# Patient Record
Sex: Male | Born: 1960 | Race: White | Hispanic: No | Smoking: Never smoker
Health system: Southern US, Community
[De-identification: ages and names within clinical notes are randomized; demographics above are authoritative.]

## PROBLEM LIST (undated history)

## (undated) DIAGNOSIS — I82409 Acute embolism and thrombosis of unspecified deep veins of unspecified lower extremity: Secondary | ICD-10-CM

## (undated) DIAGNOSIS — F419 Anxiety disorder, unspecified: Secondary | ICD-10-CM

## (undated) DIAGNOSIS — E785 Hyperlipidemia, unspecified: Secondary | ICD-10-CM

## (undated) DIAGNOSIS — I10 Essential (primary) hypertension: Secondary | ICD-10-CM

## (undated) DIAGNOSIS — F32A Depression, unspecified: Secondary | ICD-10-CM

## (undated) DIAGNOSIS — IMO0002 Reserved for concepts with insufficient information to code with codable children: Secondary | ICD-10-CM

## (undated) DIAGNOSIS — K219 Gastro-esophageal reflux disease without esophagitis: Secondary | ICD-10-CM

## (undated) DIAGNOSIS — M810 Age-related osteoporosis without current pathological fracture: Secondary | ICD-10-CM

## (undated) DIAGNOSIS — G4730 Sleep apnea, unspecified: Secondary | ICD-10-CM

## (undated) DIAGNOSIS — R112 Nausea with vomiting, unspecified: Secondary | ICD-10-CM

## (undated) DIAGNOSIS — M329 Systemic lupus erythematosus, unspecified: Secondary | ICD-10-CM

## (undated) DIAGNOSIS — J449 Chronic obstructive pulmonary disease, unspecified: Secondary | ICD-10-CM

## (undated) DIAGNOSIS — I2699 Other pulmonary embolism without acute cor pulmonale: Secondary | ICD-10-CM

## (undated) DIAGNOSIS — Z87442 Personal history of urinary calculi: Secondary | ICD-10-CM

## (undated) DIAGNOSIS — N289 Disorder of kidney and ureter, unspecified: Secondary | ICD-10-CM

## (undated) DIAGNOSIS — J189 Pneumonia, unspecified organism: Secondary | ICD-10-CM

## (undated) DIAGNOSIS — J45909 Unspecified asthma, uncomplicated: Secondary | ICD-10-CM

## (undated) DIAGNOSIS — F329 Major depressive disorder, single episode, unspecified: Secondary | ICD-10-CM

## (undated) DIAGNOSIS — R0600 Dyspnea, unspecified: Secondary | ICD-10-CM

## (undated) DIAGNOSIS — Z9889 Other specified postprocedural states: Secondary | ICD-10-CM

## (undated) HISTORY — DX: Chronic obstructive pulmonary disease, unspecified: J44.9

## (undated) HISTORY — DX: Age-related osteoporosis without current pathological fracture: M81.0

## (undated) HISTORY — PX: BACK SURGERY: SHX140

## (undated) HISTORY — DX: Unspecified asthma, uncomplicated: J45.909

## (undated) HISTORY — DX: Sleep apnea, unspecified: G47.30

## (undated) HISTORY — DX: Essential (primary) hypertension: I10

## (undated) HISTORY — PX: ANKLE SURGERY: SHX546

## (undated) HISTORY — DX: Anxiety disorder, unspecified: F41.9

## (undated) HISTORY — DX: Morbid (severe) obesity due to excess calories: E66.01

## (undated) HISTORY — PX: CYST EXCISION: SHX5701

## (undated) SURGERY — Surgical Case
Anesthesia: *Unknown

---

## 2006-04-25 ENCOUNTER — Observation Stay: Payer: Self-pay | Admitting: Urology

## 2006-10-07 ENCOUNTER — Ambulatory Visit: Payer: Self-pay | Admitting: Unknown Physician Specialty

## 2007-09-03 ENCOUNTER — Emergency Department: Payer: Self-pay

## 2007-11-08 ENCOUNTER — Ambulatory Visit: Payer: Self-pay | Admitting: Ophthalmology

## 2007-11-27 ENCOUNTER — Emergency Department: Payer: Self-pay | Admitting: Emergency Medicine

## 2007-12-08 ENCOUNTER — Emergency Department: Payer: Self-pay | Admitting: Emergency Medicine

## 2007-12-24 ENCOUNTER — Emergency Department: Payer: Self-pay | Admitting: Emergency Medicine

## 2007-12-24 ENCOUNTER — Other Ambulatory Visit: Payer: Self-pay

## 2008-06-22 ENCOUNTER — Ambulatory Visit: Payer: Self-pay | Admitting: Ophthalmology

## 2009-05-07 ENCOUNTER — Emergency Department: Payer: Self-pay | Admitting: Emergency Medicine

## 2009-11-09 ENCOUNTER — Ambulatory Visit: Payer: Self-pay | Admitting: Gastroenterology

## 2009-12-01 ENCOUNTER — Emergency Department: Payer: Self-pay | Admitting: Internal Medicine

## 2009-12-11 ENCOUNTER — Ambulatory Visit: Payer: Self-pay | Admitting: Gastroenterology

## 2009-12-20 ENCOUNTER — Ambulatory Visit: Payer: Self-pay | Admitting: Gastroenterology

## 2010-12-16 ENCOUNTER — Emergency Department: Payer: Self-pay | Admitting: Emergency Medicine

## 2010-12-20 ENCOUNTER — Ambulatory Visit: Payer: Self-pay | Admitting: Orthopedic Surgery

## 2011-02-27 ENCOUNTER — Ambulatory Visit: Payer: Self-pay | Admitting: Family Medicine

## 2013-06-30 ENCOUNTER — Emergency Department: Payer: Self-pay | Admitting: Emergency Medicine

## 2013-06-30 LAB — CBC WITH DIFFERENTIAL/PLATELET
Basophil %: 0.6 %
Eosinophil #: 0.4 10*3/uL (ref 0.0–0.7)
Eosinophil %: 8 %
HCT: 37.8 % — ABNORMAL LOW (ref 40.0–52.0)
HGB: 12.8 g/dL — ABNORMAL LOW (ref 13.0–18.0)
Lymphocyte %: 25.1 %
MCHC: 33.9 g/dL (ref 32.0–36.0)
MCV: 87 fL (ref 80–100)
Neutrophil #: 2.6 10*3/uL (ref 1.4–6.5)
Neutrophil %: 56.6 %
WBC: 4.5 10*3/uL (ref 3.8–10.6)

## 2013-06-30 LAB — PROTIME-INR
INR: 1
Prothrombin Time: 13.6 secs (ref 11.5–14.7)

## 2013-06-30 LAB — COMPREHENSIVE METABOLIC PANEL
Albumin: 3.1 g/dL — ABNORMAL LOW (ref 3.4–5.0)
Alkaline Phosphatase: 124 U/L (ref 50–136)
Anion Gap: 5 — ABNORMAL LOW (ref 7–16)
Bilirubin,Total: 0.3 mg/dL (ref 0.2–1.0)
Chloride: 106 mmol/L (ref 98–107)
EGFR (African American): >60
Osmolality: 281 (ref 275–301)
SGOT(AST): 23 U/L (ref 15–37)
SGPT (ALT): 23 U/L (ref 12–78)
Sodium: 140 mmol/L (ref 136–145)
Total Protein: 7.5 g/dL (ref 6.4–8.2)

## 2013-07-12 ENCOUNTER — Inpatient Hospital Stay: Payer: Self-pay | Admitting: Student

## 2013-07-12 LAB — COMPREHENSIVE METABOLIC PANEL
Albumin: 3.2 g/dL — ABNORMAL LOW (ref 3.4–5.0)
Alkaline Phosphatase: 108 U/L (ref 50–136)
Anion Gap: 5 — ABNORMAL LOW (ref 7–16)
Bilirubin,Total: 0.3 mg/dL (ref 0.2–1.0)
Calcium, Total: 8.5 mg/dL (ref 8.5–10.1)
Glucose: 93 mg/dL (ref 65–99)
Osmolality: 277 (ref 275–301)
Potassium: 3.9 mmol/L (ref 3.5–5.1)
Sodium: 139 mmol/L (ref 136–145)
Total Protein: 7.5 g/dL (ref 6.4–8.2)

## 2013-07-12 LAB — CBC
HCT: 39 % — ABNORMAL LOW (ref 40.0–52.0)
HGB: 13.3 g/dL (ref 13.0–18.0)
MCV: 87 fL (ref 80–100)
Platelet: 207 10*3/uL (ref 150–440)
RDW: 13.7 % (ref 11.5–14.5)
WBC: 4 10*3/uL (ref 3.8–10.6)

## 2013-07-12 LAB — PROTIME-INR: INR: 1.5

## 2013-07-12 LAB — CK TOTAL AND CKMB (NOT AT ARMC): CK-MB: 0.6 ng/mL (ref 0.5–3.6)

## 2013-07-12 LAB — TROPONIN I: Troponin-I: <0.02 ng/mL

## 2013-07-13 ENCOUNTER — Ambulatory Visit: Payer: Self-pay | Admitting: Internal Medicine

## 2013-07-13 DIAGNOSIS — I059 Rheumatic mitral valve disease, unspecified: Secondary | ICD-10-CM

## 2013-07-13 LAB — CBC WITH DIFFERENTIAL/PLATELET
Basophil %: 0.6 %
Eosinophil #: 0.4 10*3/uL (ref 0.0–0.7)
Eosinophil %: 8.8 %
HGB: 13.6 g/dL (ref 13.0–18.0)
Lymphocyte #: 1.1 10*3/uL (ref 1.0–3.6)
Lymphocyte %: 26.8 %
MCH: 29.2 pg (ref 26.0–34.0)
MCV: 87 fL (ref 80–100)
Monocyte %: 8.3 %
Neutrophil #: 2.2 10*3/uL (ref 1.4–6.5)
Platelet: 200 10*3/uL (ref 150–440)

## 2013-07-13 LAB — PROTIME-INR: Prothrombin Time: 14.6 secs (ref 11.5–14.7)

## 2013-07-14 LAB — CBC WITH DIFFERENTIAL/PLATELET
Basophil #: 0 10*3/uL (ref 0.0–0.1)
Basophil %: 1 %
Eosinophil %: 9.4 %
HCT: 41.1 % (ref 40.0–52.0)
HGB: 13.9 g/dL (ref 13.0–18.0)
Lymphocyte #: 1.3 10*3/uL (ref 1.0–3.6)
MCH: 29.2 pg (ref 26.0–34.0)
MCHC: 33.9 g/dL (ref 32.0–36.0)
MCV: 86 fL (ref 80–100)
Neutrophil %: 50.5 %
WBC: 4.4 10*3/uL (ref 3.8–10.6)

## 2013-07-14 LAB — PROTIME-INR: Prothrombin Time: 15.9 secs — ABNORMAL HIGH (ref 11.5–14.7)

## 2013-07-15 LAB — CEA: CEA: 1.6 ng/mL (ref 0.0–4.7)

## 2013-07-18 ENCOUNTER — Ambulatory Visit: Payer: Self-pay | Admitting: Internal Medicine

## 2013-07-21 LAB — CANCER CTR PLATELET CT: Platelet: 184 x10 3/mm (ref 150–440)

## 2013-07-22 ENCOUNTER — Ambulatory Visit: Payer: Self-pay | Admitting: Internal Medicine

## 2013-07-25 LAB — CANCER CTR PLATELET CT: Platelet: 186 x10 3/mm (ref 150–440)

## 2013-07-27 LAB — CANCER CTR PLATELET CT: Platelet: 186 x10 3/mm (ref 150–440)

## 2013-08-01 LAB — PLATELET COUNT: Platelet: 191 10*3/uL (ref 150–440)

## 2013-08-04 ENCOUNTER — Emergency Department: Payer: Self-pay | Admitting: Emergency Medicine

## 2013-08-04 LAB — COMPREHENSIVE METABOLIC PANEL
Albumin: 3.4 g/dL (ref 3.4–5.0)
Alkaline Phosphatase: 117 U/L (ref 50–136)
BUN: 11 mg/dL (ref 7–18)
Bilirubin,Total: 0.4 mg/dL (ref 0.2–1.0)
Calcium, Total: 8.7 mg/dL (ref 8.5–10.1)
Chloride: 105 mmol/L (ref 98–107)
Co2: 27 mmol/L (ref 21–32)
Creatinine: 0.91 mg/dL (ref 0.60–1.30)
EGFR (African American): >60
EGFR (Non-African Amer.): >60
Glucose: 95 mg/dL (ref 65–99)
Potassium: 3.9 mmol/L (ref 3.5–5.1)
SGOT(AST): 30 U/L (ref 15–37)
SGPT (ALT): 39 U/L (ref 12–78)
Sodium: 137 mmol/L (ref 136–145)
Total Protein: 7.8 g/dL (ref 6.4–8.2)

## 2013-08-04 LAB — PROTIME-INR: Prothrombin Time: 13.3 secs (ref 11.5–14.7)

## 2013-08-04 LAB — URINALYSIS, COMPLETE
Glucose,UR: NEGATIVE mg/dL (ref 0–75)
Ketone: NEGATIVE
RBC,UR: 1 /HPF (ref 0–5)
Specific Gravity: 1.005 (ref 1.003–1.030)
Squamous Epithelial: NONE SEEN

## 2013-08-04 LAB — CBC
HCT: 42 % (ref 40.0–52.0)
HGB: 14.7 g/dL (ref 13.0–18.0)
MCH: 30 pg (ref 26.0–34.0)
MCHC: 35 g/dL (ref 32.0–36.0)
MCV: 86 fL (ref 80–100)
RDW: 14.2 % (ref 11.5–14.5)
WBC: 5.7 10*3/uL (ref 3.8–10.6)

## 2013-08-14 ENCOUNTER — Emergency Department: Payer: Self-pay | Admitting: Emergency Medicine

## 2013-08-14 LAB — BASIC METABOLIC PANEL
Anion Gap: 5 — ABNORMAL LOW (ref 7–16)
BUN: 11 mg/dL (ref 7–18)
Co2: 25 mmol/L (ref 21–32)
EGFR (African American): >60
EGFR (Non-African Amer.): >60
Glucose: 81 mg/dL (ref 65–99)
Potassium: 3.8 mmol/L (ref 3.5–5.1)
Sodium: 135 mmol/L — ABNORMAL LOW (ref 136–145)

## 2013-08-14 LAB — CBC
HCT: 38 % — ABNORMAL LOW (ref 40.0–52.0)
MCH: 29.5 pg (ref 26.0–34.0)
Platelet: 149 10*3/uL — ABNORMAL LOW (ref 150–440)
RBC: 4.46 10*6/uL (ref 4.40–5.90)
RDW: 14.1 % (ref 11.5–14.5)

## 2013-08-14 LAB — APTT: Activated PTT: 33.4 secs (ref 23.6–35.9)

## 2013-08-14 LAB — PROTIME-INR: Prothrombin Time: 14.4 secs (ref 11.5–14.7)

## 2013-08-21 DIAGNOSIS — G08 Intracranial and intraspinal phlebitis and thrombophlebitis: Secondary | ICD-10-CM | POA: Insufficient documentation

## 2013-08-21 DIAGNOSIS — Q446 Cystic disease of liver: Secondary | ICD-10-CM | POA: Insufficient documentation

## 2013-08-21 DIAGNOSIS — N200 Calculus of kidney: Secondary | ICD-10-CM | POA: Insufficient documentation

## 2013-08-21 HISTORY — DX: Calculus of kidney: N20.0

## 2013-08-21 HISTORY — DX: Intracranial and intraspinal phlebitis and thrombophlebitis: G08

## 2013-08-22 ENCOUNTER — Ambulatory Visit: Payer: Self-pay | Admitting: Internal Medicine

## 2013-09-01 LAB — PROTIME-INR: INR: 1.8

## 2013-09-06 LAB — PROTIME-INR: Prothrombin Time: 26.7 secs — ABNORMAL HIGH (ref 11.5–14.7)

## 2013-09-12 LAB — PROTIME-INR
INR: 3.5
Prothrombin Time: 34.1 secs — ABNORMAL HIGH (ref 11.5–14.7)

## 2013-09-16 LAB — PROTIME-INR: Prothrombin Time: 18.6 secs — ABNORMAL HIGH (ref 11.5–14.7)

## 2013-09-21 ENCOUNTER — Ambulatory Visit: Payer: Self-pay | Admitting: Internal Medicine

## 2013-10-06 LAB — PROTIME-INR: INR: 1.7

## 2013-10-22 ENCOUNTER — Ambulatory Visit: Payer: Self-pay | Admitting: Internal Medicine

## 2013-12-01 ENCOUNTER — Ambulatory Visit: Payer: Self-pay | Admitting: Vascular Surgery

## 2013-12-01 LAB — PROTIME-INR: INR: 1.9

## 2013-12-09 ENCOUNTER — Encounter (HOSPITAL_COMMUNITY): Payer: Self-pay | Admitting: Emergency Medicine

## 2013-12-09 ENCOUNTER — Emergency Department (HOSPITAL_COMMUNITY): Payer: 59

## 2013-12-09 ENCOUNTER — Inpatient Hospital Stay (HOSPITAL_COMMUNITY)
Admission: EM | Admit: 2013-12-09 | Discharge: 2013-12-12 | DRG: 089 | Disposition: A | Discharge status: Home / Self Care | Payer: 59 | Attending: General Surgery | Admitting: General Surgery

## 2013-12-09 DIAGNOSIS — Z86711 Personal history of pulmonary embolism: Secondary | ICD-10-CM

## 2013-12-09 DIAGNOSIS — S060XAA Concussion with loss of consciousness status unknown, initial encounter: Secondary | ICD-10-CM

## 2013-12-09 DIAGNOSIS — S060X9A Concussion with loss of consciousness of unspecified duration, initial encounter: Secondary | ICD-10-CM

## 2013-12-09 DIAGNOSIS — S82891A Other fracture of right lower leg, initial encounter for closed fracture: Secondary | ICD-10-CM

## 2013-12-09 DIAGNOSIS — Z86718 Personal history of other venous thrombosis and embolism: Secondary | ICD-10-CM

## 2013-12-09 DIAGNOSIS — S82899A Other fracture of unspecified lower leg, initial encounter for closed fracture: Secondary | ICD-10-CM

## 2013-12-09 DIAGNOSIS — D62 Acute posthemorrhagic anemia: Secondary | ICD-10-CM | POA: Diagnosis present

## 2013-12-09 DIAGNOSIS — S060X0A Concussion without loss of consciousness, initial encounter: Secondary | ICD-10-CM

## 2013-12-09 DIAGNOSIS — S82853A Displaced trimalleolar fracture of unspecified lower leg, initial encounter for closed fracture: Secondary | ICD-10-CM | POA: Diagnosis present

## 2013-12-09 DIAGNOSIS — Z7901 Long term (current) use of anticoagulants: Secondary | ICD-10-CM

## 2013-12-09 DIAGNOSIS — R911 Solitary pulmonary nodule: Secondary | ICD-10-CM | POA: Diagnosis present

## 2013-12-09 HISTORY — DX: Essential (primary) hypertension: I10

## 2013-12-09 HISTORY — DX: Acute embolism and thrombosis of unspecified deep veins of unspecified lower extremity: I82.409

## 2013-12-09 HISTORY — DX: Hyperlipidemia, unspecified: E78.5

## 2013-12-09 HISTORY — DX: Other pulmonary embolism without acute cor pulmonale: I26.99

## 2013-12-09 LAB — POCT I-STAT, CHEM 8
BUN: 18 mg/dL (ref 6–23)
Calcium, Ion: 1.2 mmol/L (ref 1.12–1.23)
Creatinine, Ser: 1.1 mg/dL (ref 0.50–1.35)
Potassium: 4 mEq/L (ref 3.5–5.1)
Sodium: 141 mEq/L (ref 135–145)
TCO2: 26 mmol/L (ref 0–100)

## 2013-12-09 MED ORDER — ETOMIDATE 2 MG/ML IV SOLN
10.0000 mg | Freq: Once | INTRAVENOUS | Status: AC
  Administered 2013-12-09: 10 mg via INTRAVENOUS

## 2013-12-09 MED ORDER — HYDROMORPHONE HCL PF 1 MG/ML IJ SOLN
1.0000 mg | Freq: Once | INTRAMUSCULAR | Status: AC
  Administered 2013-12-10: 1 mg via INTRAVENOUS
  Filled 2013-12-09: qty 1

## 2013-12-09 MED ORDER — ONDANSETRON HCL 4 MG/2ML IJ SOLN
4.0000 mg | Freq: Once | INTRAMUSCULAR | Status: AC
  Administered 2013-12-09: 4 mg via INTRAVENOUS
  Filled 2013-12-09: qty 2

## 2013-12-09 MED ORDER — HYDROMORPHONE HCL PF 1 MG/ML IJ SOLN
1.0000 mg | Freq: Once | INTRAMUSCULAR | Status: AC
  Administered 2013-12-09: 1 mg via INTRAVENOUS
  Filled 2013-12-09: qty 1

## 2013-12-09 MED ORDER — ETOMIDATE 2 MG/ML IV SOLN
INTRAVENOUS | Status: AC
  Filled 2013-12-09: qty 10
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
## 2013-12-09 NOTE — ED Notes (Signed)
Ortho at bedside wrapping up leg
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
## 2013-12-09 NOTE — ED Provider Notes (Signed)
Reduction of dislocation Date/Time: 12/09/2013 11:51 PM Performed by: Olivia Mackie Authorized by: Olivia Mackie Consent: Verbal consent obtained. written consent obtained. Risks and benefits: risks, benefits and alternatives were discussed Consent given by: patient Required items: required blood products, implants, devices, and special equipment available Patient identity confirmed: verbally with patient Time out: Immediately prior to procedure a "time out" was called to verify the correct patient, procedure, equipment, support staff and site/side marked as required. Preparation: Patient was prepped and draped in the usual sterile fashion. Local anesthesia used: no Patient sedated: yes Sedatives: etomidate Analgesia: hydromorphone Sedation start date/time: 12/09/2013 11:25 PM Sedation end date/time: 12/09/2013 11:38 PM Vitals: Vital signs were monitored during sedation. Patient tolerance: Patient tolerated the procedure well with no immediate complications.    Olivia Mackie, MD 12/09/13 [phone]

## 2013-12-09 NOTE — ED Notes (Addendum)
Reduction completed by Dr. Norlene Campbell and Cammy Copa, PA on right ankle, pulses heard with dopplar.

## 2013-12-09 NOTE — ED Notes (Signed)
Ortho completed short leg splint
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
## 2013-12-10 ENCOUNTER — Encounter (HOSPITAL_COMMUNITY): Payer: Self-pay | Admitting: Emergency Medicine

## 2013-12-10 ENCOUNTER — Emergency Department (HOSPITAL_COMMUNITY): Payer: 59

## 2013-12-10 DIAGNOSIS — S82899A Other fracture of unspecified lower leg, initial encounter for closed fracture: Secondary | ICD-10-CM

## 2013-12-10 DIAGNOSIS — S060XAA Concussion with loss of consciousness status unknown, initial encounter: Secondary | ICD-10-CM

## 2013-12-10 DIAGNOSIS — S060X9A Concussion with loss of consciousness of unspecified duration, initial encounter: Secondary | ICD-10-CM

## 2013-12-10 LAB — COMPREHENSIVE METABOLIC PANEL
AST: 33 U/L (ref 0–37)
Albumin: 3.7 g/dL (ref 3.5–5.2)
Alkaline Phosphatase: 91 U/L (ref 39–117)
BUN: 17 mg/dL (ref 6–23)
CO2: 27 mEq/L (ref 19–32)
Chloride: 102 mEq/L (ref 96–112)
GFR calc non Af Amer: >90 mL/min (ref >90)
Glucose, Bld: 129 mg/dL — ABNORMAL HIGH (ref 70–99)
Potassium: 4.2 mEq/L (ref 3.5–5.1)
Sodium: 138 mEq/L (ref 135–145)
Total Bilirubin: 0.5 mg/dL (ref 0.3–1.2)

## 2013-12-10 LAB — URINE MICROSCOPIC-ADD ON

## 2013-12-10 LAB — URINALYSIS, ROUTINE W REFLEX MICROSCOPIC
Glucose, UA: NEGATIVE mg/dL
Protein, ur: NEGATIVE mg/dL
Specific Gravity, Urine: 1.036 — ABNORMAL HIGH (ref 1.005–1.030)

## 2013-12-10 LAB — RAPID URINE DRUG SCREEN, HOSP PERFORMED
Amphetamines: NOT DETECTED
Benzodiazepines: NOT DETECTED
Cocaine: NOT DETECTED
Opiates: NOT DETECTED
Tetrahydrocannabinol: NOT DETECTED

## 2013-12-10 LAB — CBC
HCT: 39 % (ref 39.0–52.0)
Hemoglobin: 13.3 g/dL (ref 13.0–17.0)
RBC: 4.48 MIL/uL (ref 4.22–5.81)
WBC: 7.8 10*3/uL (ref 4.0–10.5)

## 2013-12-10 LAB — ABO/RH: ABO/RH(D): A POS

## 2013-12-10 LAB — CG4 I-STAT (LACTIC ACID): Lactic Acid, Venous: 1.21 mmol/L (ref 0.5–2.2)

## 2013-12-10 LAB — PROTIME-INR: INR: 1.89 — ABNORMAL HIGH (ref 0.00–1.49)

## 2013-12-10 LAB — ETHANOL: Alcohol, Ethyl (B): <11 mg/dL (ref 0–11)

## 2013-12-10 MED ORDER — HYDROMORPHONE HCL PF 1 MG/ML IJ SOLN
1.0000 mg | Freq: Once | INTRAMUSCULAR | Status: AC
  Administered 2013-12-10: 1 mg via INTRAVENOUS
  Filled 2013-12-10: qty 1

## 2013-12-10 MED ORDER — PANTOPRAZOLE SODIUM 40 MG IV SOLR
40.0000 mg | Freq: Every day | INTRAVENOUS | Status: DC
  Administered 2013-12-10: 40 mg via INTRAVENOUS
  Filled 2013-12-10 (×2): qty 40

## 2013-12-10 MED ORDER — ONDANSETRON HCL 4 MG/2ML IJ SOLN
4.0000 mg | Freq: Once | INTRAMUSCULAR | Status: AC
  Administered 2013-12-10: 4 mg via INTRAVENOUS

## 2013-12-10 MED ORDER — MORPHINE SULFATE 2 MG/ML IJ SOLN
2.0000 mg | INTRAMUSCULAR | Status: DC | PRN
  Administered 2013-12-10: 2 mg via INTRAVENOUS
  Filled 2013-12-10: qty 1

## 2013-12-10 MED ORDER — ONDANSETRON HCL 4 MG PO TABS: 4.0000 mg | ORAL_TABLET | Freq: Four times a day (QID) | ORAL | Status: DC | PRN

## 2013-12-10 MED ORDER — ONDANSETRON HCL 4 MG/2ML IJ SOLN: 4.0000 mg | Freq: Four times a day (QID) | INTRAMUSCULAR | Status: DC | PRN

## 2013-12-10 MED ORDER — MORPHINE SULFATE 4 MG/ML IJ SOLN
4.0000 mg | INTRAMUSCULAR | Status: DC | PRN
  Administered 2013-12-10 (×4): 4 mg via INTRAVENOUS
  Filled 2013-12-10 (×4): qty 1

## 2013-12-10 MED ORDER — ONDANSETRON HCL 4 MG/2ML IJ SOLN
4.0000 mg | Freq: Once | INTRAMUSCULAR | Status: DC
  Filled 2013-12-10: qty 2

## 2013-12-10 MED ORDER — HYDROMORPHONE HCL PF 1 MG/ML IJ SOLN
1.0000 mg | INTRAMUSCULAR | Status: DC | PRN
  Administered 2013-12-11 (×3): 1 mg via INTRAVENOUS
  Filled 2013-12-10 (×3): qty 1

## 2013-12-10 MED ORDER — MORPHINE SULFATE 2 MG/ML IJ SOLN: 1.0000 mg | INTRAMUSCULAR | Status: DC | PRN

## 2013-12-10 MED ORDER — PANTOPRAZOLE SODIUM 40 MG PO TBEC
40.0000 mg | DELAYED_RELEASE_TABLET | Freq: Every day | ORAL | Status: DC
  Administered 2013-12-11 – 2013-12-12 (×2): 40 mg via ORAL
  Filled 2013-12-10 (×2): qty 1

## 2013-12-10 MED ORDER — POTASSIUM CHLORIDE IN NACL 20-0.9 MEQ/L-% IV SOLN
INTRAVENOUS | Status: DC
  Administered 2013-12-10 – 2013-12-11 (×2): via INTRAVENOUS
  Filled 2013-12-10 (×6): qty 1000

## 2013-12-10 MED ORDER — ONDANSETRON HCL 4 MG/2ML IJ SOLN
4.0000 mg | Freq: Once | INTRAMUSCULAR | Status: AC
  Administered 2013-12-10: 4 mg via INTRAVENOUS
  Filled 2013-12-10: qty 2

## 2013-12-10 NOTE — ED Notes (Signed)
Dr. Norlene Campbell notified of pt memory issues
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
## 2013-12-10 NOTE — ED Notes (Signed)
Pt returned from CT. Pt sts he doesn't remember going to CT. Pt nauseous.
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
## 2013-12-10 NOTE — ED Notes (Signed)
Ortho at bedside.
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
## 2013-12-11 DIAGNOSIS — R911 Solitary pulmonary nodule: Secondary | ICD-10-CM | POA: Diagnosis present

## 2013-12-11 DIAGNOSIS — Z86711 Personal history of pulmonary embolism: Secondary | ICD-10-CM | POA: Diagnosis present

## 2013-12-11 LAB — BASIC METABOLIC PANEL
BUN: 13 mg/dL (ref 6–23)
CO2: 22 mEq/L (ref 19–32)
Calcium: 7.8 mg/dL — ABNORMAL LOW (ref 8.4–10.5)
Chloride: 103 mEq/L (ref 96–112)
Creatinine, Ser: 0.85 mg/dL (ref 0.50–1.35)
GFR calc Af Amer: >90 mL/min (ref >90)
GFR calc non Af Amer: >90 mL/min (ref >90)
Glucose, Bld: 104 mg/dL — ABNORMAL HIGH (ref 70–99)
Potassium: 4.3 mEq/L (ref 3.5–5.1)
Sodium: 133 mEq/L — ABNORMAL LOW (ref 135–145)

## 2013-12-11 LAB — CBC
HCT: 36.1 % — ABNORMAL LOW (ref 39.0–52.0)
Hemoglobin: 12.1 g/dL — ABNORMAL LOW (ref 13.0–17.0)
MCHC: 33.5 g/dL (ref 30.0–36.0)
MCV: 87.2 fL (ref 78.0–100.0)
Platelets: ADEQUATE 10*3/uL (ref 150–400)
RDW: 15.1 % (ref 11.5–15.5)
WBC: 4.1 10*3/uL (ref 4.0–10.5)

## 2013-12-11 LAB — PROTIME-INR
INR: 2.28 — ABNORMAL HIGH (ref 0.00–1.49)
Prothrombin Time: 24.4 seconds — ABNORMAL HIGH (ref 11.6–15.2)

## 2013-12-11 MED ORDER — OXYCODONE-ACETAMINOPHEN 5-325 MG PO TABS
1.0000 | ORAL_TABLET | ORAL | Status: DC | PRN
  Administered 2013-12-11 – 2013-12-12 (×4): 2 via ORAL
  Filled 2013-12-11 (×4): qty 2

## 2013-12-11 MED ORDER — ENOXAPARIN SODIUM 150 MG/ML ~~LOC~~ SOLN
150.0000 mg | SUBCUTANEOUS | Status: DC
  Administered 2013-12-11 – 2013-12-12 (×2): 150 mg via SUBCUTANEOUS
  Filled 2013-12-11 (×2): qty 1

## 2013-12-11 MED ORDER — DOCUSATE SODIUM 100 MG PO CAPS
100.0000 mg | ORAL_CAPSULE | Freq: Two times a day (BID) | ORAL | Status: DC
  Administered 2013-12-11 – 2013-12-12 (×3): 100 mg via ORAL
  Filled 2013-12-11 (×3): qty 1

## 2013-12-11 NOTE — Progress Notes (Signed)
Utilization Review completed.  
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
## 2013-12-12 ENCOUNTER — Telehealth (INDEPENDENT_AMBULATORY_CARE_PROVIDER_SITE_OTHER): Payer: Self-pay

## 2013-12-12 DIAGNOSIS — S060X9A Concussion with loss of consciousness of unspecified duration, initial encounter: Secondary | ICD-10-CM

## 2013-12-12 DIAGNOSIS — S060XAA Concussion with loss of consciousness status unknown, initial encounter: Secondary | ICD-10-CM

## 2013-12-12 LAB — CBC
MCH: 29 pg (ref 26.0–34.0)
MCV: 86.6 fL (ref 78.0–100.0)
Platelets: 114 10*3/uL — ABNORMAL LOW (ref 150–400)
RDW: 14.8 % (ref 11.5–15.5)
WBC: 4.1 10*3/uL (ref 4.0–10.5)

## 2013-12-12 LAB — PROTIME-INR
INR: 1.85 — ABNORMAL HIGH (ref 0.00–1.49)
Prothrombin Time: 20.8 seconds — ABNORMAL HIGH (ref 11.6–15.2)

## 2013-12-12 MED ORDER — ENOXAPARIN SODIUM 120 MG/0.8ML ~~LOC~~ SOLN: 110.0000 mg | Freq: Two times a day (BID) | SUBCUTANEOUS | Status: DC

## 2013-12-12 MED ORDER — OXYCODONE-ACETAMINOPHEN 5-325 MG PO TABS: 1.0000 | ORAL_TABLET | ORAL | Status: DC | PRN
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
## 2013-12-12 NOTE — Progress Notes (Signed)
Agree Deepika Decatur, MD, MPH, FACS Pager: [phone]  

## 2013-12-12 NOTE — Discharge Summary (Signed)
Rickard Kennerly, MD, MPH, FACS Pager: [phone]  
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
## 2013-12-12 NOTE — Care Management Note (Signed)
  Page 1 of 1   12/12/2013     11:23:46 AM   CARE MANAGEMENT NOTE 12/12/2013  Patient:  Jonathon Snow, Jonathon Snow   Account Number:  [id number]  Date Initiated:  12/12/2013  Documentation initiated by:  Ronny Flurry  Subjective/Objective Assessment:     Action/Plan:   Anticipated DC Date:  12/12/2013   Anticipated DC Plan:           Choice offered to / List presented to:     DME arranged  3-N-1  Levan Hurst      DME agency  [facility]        Status of service:   Medicare Important Message given?   (If response is "NO", the following Medicare IM given date fields will be blank) Date Medicare IM given:   Date Additional Medicare IM given:    Discharge Disposition:    Per UR Regulation:    If discussed at Long Length of Stay Meetings, dates discussed:    Comments:  12-12-2013 Bedside nurse asked assistance for Lovenox for patient. Confirmed with patient he does have health insurance [employer] , last time he was prescribed Lovenox his co pay was $250 / week , he can not afford Lovenox. Unable to assist patinet Dr Janee Morn is aware and will reassess . Ronny Flurry RN BSN [phone]

## 2013-12-12 NOTE — Progress Notes (Signed)
Discharge instructions reviewed with pt and prescriptions given.  Pt verbalized understanding and had no questions.  Pt discharged in stable condition via wheelchair with daughter.  Monterrius Cardosa Lindsay   

## 2013-12-12 NOTE — Telephone Encounter (Signed)
Patient states he does not have the funds for Lovenox , Spoke with Dr. Janee Morn he states patient is going to be advised by [facility] care management  Patient is aware
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
## 2013-12-12 NOTE — Progress Notes (Signed)
Pt stated prior to discharge that he could not afford Lovenox.  Case management made aware and stated they were unable to assist because pt has insurance.  Case manager notified Dr. Janee Morn and Dr. Janee Morn stated he would reassess.  Pt made aware and did not want to wait for Dr. Janee Morn.  Pt stated he would take Lovenox prescription, but that he would not get it filled due to finances.  RN made pt aware of the importance of Lovenox and encouraged pt to get prescription filled.  PA notified of above.  H[place]
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
## 2013-12-13 ENCOUNTER — Telehealth (HOSPITAL_COMMUNITY): Payer: Self-pay | Admitting: Emergency Medicine

## 2013-12-14 NOTE — Telephone Encounter (Signed)
I received a call this morning from the patient's hematologist. He called him as well trying to get the Lovenox. After discussion with me and Dr. Victorino Dike he was going to be able to dispense him enough to get him through to his surgery date.

## 2013-12-16 ENCOUNTER — Ambulatory Visit: Payer: Self-pay | Admitting: Internal Medicine

## 2013-12-16 LAB — CREATININE, SERUM
Creatinine: 0.99 mg/dL (ref 0.60–1.30)
EGFR (African American): >60
EGFR (Non-African Amer.): >60

## 2013-12-16 LAB — CBC CANCER CENTER
Basophil #: 0 x10 3/mm (ref 0.0–0.1)
Basophil %: 0.3 %
Eosinophil %: 2.8 %
HCT: 39 % — ABNORMAL LOW (ref 40.0–52.0)
HGB: 13 g/dL (ref 13.0–18.0)
Lymphocyte #: 0.6 x10 3/mm — ABNORMAL LOW (ref 1.0–3.6)
Lymphocyte %: 14.2 %
MCH: 29 pg (ref 26.0–34.0)
MCHC: 33.3 g/dL (ref 32.0–36.0)
MCV: 87 fL (ref 80–100)
Monocyte #: 0.3 x10 3/mm (ref 0.2–1.0)
Neutrophil #: 3 x10 3/mm (ref 1.4–6.5)
Neutrophil %: 74.5 %
RBC: 4.49 10*6/uL (ref 4.40–5.90)
RDW: 14.2 % (ref 11.5–14.5)
WBC: 4 x10 3/mm (ref 3.8–10.6)

## 2013-12-22 ENCOUNTER — Ambulatory Visit: Payer: Self-pay | Admitting: Internal Medicine

## 2014-01-06 LAB — CBC CANCER CENTER
BASOS ABS: 0 x10 3/mm (ref 0.0–0.1)
Basophil %: 0.7 %
Eosinophil #: 0.1 x10 3/mm (ref 0.0–0.7)
Eosinophil %: 3.2 %
HCT: 38.1 % — ABNORMAL LOW (ref 40.0–52.0)
HGB: 12.7 g/dL — ABNORMAL LOW (ref 13.0–18.0)
LYMPHS ABS: 0.6 x10 3/mm — AB (ref 1.0–3.6)
LYMPHS PCT: 19.8 %
MCH: 28.4 pg (ref 26.0–34.0)
MCHC: 33.4 g/dL (ref 32.0–36.0)
MCV: 85 fL (ref 80–100)
MONO ABS: 0.2 x10 3/mm (ref 0.2–1.0)
Monocyte %: 7.6 %
NEUTROS PCT: 68.7 %
Neutrophil #: 2 x10 3/mm (ref 1.4–6.5)
Platelet: 214 x10 3/mm (ref 150–440)
RBC: 4.48 10*6/uL (ref 4.40–5.90)
RDW: 13.6 % (ref 11.5–14.5)
WBC: 3 x10 3/mm — AB (ref 3.8–10.6)

## 2014-01-06 LAB — PROTIME-INR
INR: 2.4
PROTHROMBIN TIME: 25.4 s — AB (ref 11.5–14.7)

## 2014-01-12 ENCOUNTER — Emergency Department: Payer: Self-pay | Admitting: Emergency Medicine

## 2014-01-12 LAB — COMPREHENSIVE METABOLIC PANEL
ALT: 47 U/L (ref 12–78)
ANION GAP: 6 — AB (ref 7–16)
Albumin: 3.6 g/dL (ref 3.4–5.0)
Alkaline Phosphatase: 170 U/L — ABNORMAL HIGH
BUN: 13 mg/dL (ref 7–18)
Bilirubin,Total: 0.6 mg/dL (ref 0.2–1.0)
Calcium, Total: 9.5 mg/dL (ref 8.5–10.1)
Chloride: 103 mmol/L (ref 98–107)
Co2: 27 mmol/L (ref 21–32)
Creatinine: 0.93 mg/dL (ref 0.60–1.30)
EGFR (African American): >60
EGFR (Non-African Amer.): >60
GLUCOSE: 90 mg/dL (ref 65–99)
Osmolality: 272 (ref 275–301)
Potassium: 3.9 mmol/L (ref 3.5–5.1)
SGOT(AST): 25 U/L (ref 15–37)
SODIUM: 136 mmol/L (ref 136–145)
TOTAL PROTEIN: 8.8 g/dL — AB (ref 6.4–8.2)

## 2014-01-12 LAB — CBC WITH DIFFERENTIAL/PLATELET
Basophil #: 0 10*3/uL (ref 0.0–0.1)
Basophil %: 0.5 %
EOS ABS: 0.1 10*3/uL (ref 0.0–0.7)
EOS PCT: 2.5 %
HCT: 41.7 % (ref 40.0–52.0)
HGB: 14 g/dL (ref 13.0–18.0)
LYMPHS ABS: 0.8 10*3/uL — AB (ref 1.0–3.6)
LYMPHS PCT: 15.9 %
MCH: 28.5 pg (ref 26.0–34.0)
MCHC: 33.5 g/dL (ref 32.0–36.0)
MCV: 85 fL (ref 80–100)
Monocyte #: 0.4 x10 3/mm (ref 0.2–1.0)
Monocyte %: 7.3 %
NEUTROS ABS: 3.5 10*3/uL (ref 1.4–6.5)
Neutrophil %: 73.8 %
Platelet: 174 10*3/uL (ref 150–440)
RBC: 4.89 10*6/uL (ref 4.40–5.90)
RDW: 13.8 % (ref 11.5–14.5)
WBC: 4.8 10*3/uL (ref 3.8–10.6)

## 2014-01-12 LAB — PROTIME-INR
INR: 3.4
PROTHROMBIN TIME: 33.6 s — AB (ref 11.5–14.7)

## 2014-01-13 LAB — URINALYSIS, COMPLETE
BILIRUBIN, UR: NEGATIVE
Bacteria: NONE SEEN
Glucose,UR: NEGATIVE mg/dL (ref 0–75)
Ketone: NEGATIVE
Leukocyte Esterase: NEGATIVE
NITRITE: NEGATIVE
Ph: 5 (ref 4.5–8.0)
Protein: NEGATIVE
Specific Gravity: 1.05 (ref 1.003–1.030)
WBC UR: NONE SEEN /HPF (ref 0–5)

## 2014-01-22 ENCOUNTER — Ambulatory Visit: Payer: Self-pay | Admitting: Internal Medicine

## 2014-01-27 LAB — URINALYSIS, COMPLETE
BILIRUBIN, UR: NEGATIVE
BLOOD: NEGATIVE
Bacteria: NEGATIVE
GLUCOSE, UR: NEGATIVE mg/dL (ref 0–75)
Ketone: NEGATIVE
Leukocyte Esterase: NEGATIVE
Nitrite: NEGATIVE
Ph: 6 (ref 4.5–8.0)
Specific Gravity: 1.025 (ref 1.003–1.030)

## 2014-01-27 LAB — PROTIME-INR
INR: 2.7
PROTHROMBIN TIME: 28.3 s — AB (ref 11.5–14.7)

## 2014-02-15 LAB — PROTIME-INR
INR: 2.3
PROTHROMBIN TIME: 24.6 s — AB (ref 11.5–14.7)

## 2014-02-19 ENCOUNTER — Ambulatory Visit: Payer: Self-pay | Admitting: Internal Medicine

## 2014-02-20 LAB — PROTIME-INR
INR: 2.5
Prothrombin Time: 26.7 secs — ABNORMAL HIGH (ref 11.5–14.7)

## 2014-03-02 ENCOUNTER — Inpatient Hospital Stay: Payer: Self-pay | Admitting: Internal Medicine

## 2014-03-02 LAB — URINALYSIS, COMPLETE
Bilirubin,UR: NEGATIVE
Glucose,UR: NEGATIVE mg/dL (ref 0–75)
Ketone: NEGATIVE
Leukocyte Esterase: NEGATIVE
NITRITE: NEGATIVE
PH: 6 (ref 4.5–8.0)
Protein: 30
RBC,UR: 189 /HPF (ref 0–5)
SPECIFIC GRAVITY: 1.008 (ref 1.003–1.030)
WBC UR: 6 /HPF (ref 0–5)

## 2014-03-02 LAB — CBC
HCT: 35.5 % — ABNORMAL LOW (ref 40.0–52.0)
HGB: 12.3 g/dL — ABNORMAL LOW (ref 13.0–18.0)
MCH: 29 pg (ref 26.0–34.0)
MCHC: 34.7 g/dL (ref 32.0–36.0)
MCV: 84 fL (ref 80–100)
Platelet: 162 10*3/uL (ref 150–440)
RBC: 4.24 10*6/uL — AB (ref 4.40–5.90)
RDW: 14.2 % (ref 11.5–14.5)
WBC: 3 10*3/uL — ABNORMAL LOW (ref 3.8–10.6)

## 2014-03-02 LAB — TROPONIN I

## 2014-03-02 LAB — COMPREHENSIVE METABOLIC PANEL
ALBUMIN: 3.2 g/dL — AB (ref 3.4–5.0)
ANION GAP: 6 — AB (ref 7–16)
Alkaline Phosphatase: 125 U/L — ABNORMAL HIGH
BILIRUBIN TOTAL: 0.5 mg/dL (ref 0.2–1.0)
BUN: 14 mg/dL (ref 7–18)
CALCIUM: 9 mg/dL (ref 8.5–10.1)
CHLORIDE: 99 mmol/L (ref 98–107)
Co2: 28 mmol/L (ref 21–32)
Creatinine: 1.41 mg/dL — ABNORMAL HIGH (ref 0.60–1.30)
GFR CALC NON AF AMER: 57 — AB
Glucose: 83 mg/dL (ref 65–99)
Osmolality: 266 (ref 275–301)
Potassium: 4.2 mmol/L (ref 3.5–5.1)
SGOT(AST): 44 U/L — ABNORMAL HIGH (ref 15–37)
SGPT (ALT): 55 U/L (ref 12–78)
SODIUM: 133 mmol/L — AB (ref 136–145)
TOTAL PROTEIN: 8.5 g/dL — AB (ref 6.4–8.2)

## 2014-03-02 LAB — LIPASE, BLOOD: LIPASE: 91 U/L (ref 73–393)

## 2014-03-02 LAB — PROTIME-INR
INR: 3.9
PROTHROMBIN TIME: 36.7 s — AB (ref 11.5–14.7)

## 2014-03-03 LAB — CBC WITH DIFFERENTIAL/PLATELET
BASOS ABS: 0 10*3/uL (ref 0.0–0.1)
Basophil %: 0.3 %
EOS PCT: 3.6 %
Eosinophil #: 0.1 10*3/uL (ref 0.0–0.7)
HCT: 32.9 % — ABNORMAL LOW (ref 40.0–52.0)
HGB: 11.2 g/dL — ABNORMAL LOW (ref 13.0–18.0)
Lymphocyte #: 0.5 10*3/uL — ABNORMAL LOW (ref 1.0–3.6)
Lymphocyte %: 22.7 %
MCH: 28.9 pg (ref 26.0–34.0)
MCHC: 34.1 g/dL (ref 32.0–36.0)
MCV: 85 fL (ref 80–100)
MONO ABS: 0.3 x10 3/mm (ref 0.2–1.0)
Monocyte %: 10.6 %
NEUTROS ABS: 1.5 10*3/uL (ref 1.4–6.5)
Neutrophil %: 62.8 %
PLATELETS: 134 10*3/uL — AB (ref 150–440)
RBC: 3.88 10*6/uL — ABNORMAL LOW (ref 4.40–5.90)
RDW: 14.4 % (ref 11.5–14.5)
WBC: 2.4 10*3/uL — AB (ref 3.8–10.6)

## 2014-03-03 LAB — PROTIME-INR
INR: 5.5 — AB
Prothrombin Time: 47.9 secs — ABNORMAL HIGH (ref 11.5–14.7)

## 2014-03-03 LAB — COMPREHENSIVE METABOLIC PANEL
ALK PHOS: 105 U/L
AST: 33 U/L (ref 15–37)
Albumin: 2.7 g/dL — ABNORMAL LOW (ref 3.4–5.0)
Anion Gap: 4 — ABNORMAL LOW (ref 7–16)
BILIRUBIN TOTAL: 0.3 mg/dL (ref 0.2–1.0)
BUN: 11 mg/dL (ref 7–18)
CREATININE: 1.42 mg/dL — AB (ref 0.60–1.30)
Calcium, Total: 8.2 mg/dL — ABNORMAL LOW (ref 8.5–10.1)
Chloride: 103 mmol/L (ref 98–107)
Co2: 27 mmol/L (ref 21–32)
EGFR (Non-African Amer.): 56 — ABNORMAL LOW
Glucose: 80 mg/dL (ref 65–99)
Osmolality: 267 (ref 275–301)
Potassium: 4 mmol/L (ref 3.5–5.1)
SGPT (ALT): 43 U/L (ref 12–78)
Sodium: 134 mmol/L — ABNORMAL LOW (ref 136–145)
Total Protein: 7.1 g/dL (ref 6.4–8.2)

## 2014-03-04 LAB — PROTIME-INR
INR: 4
Prothrombin Time: 37.7 secs — ABNORMAL HIGH (ref 11.5–14.7)

## 2014-03-05 LAB — BASIC METABOLIC PANEL
Anion Gap: 4 — ABNORMAL LOW (ref 7–16)
BUN: 13 mg/dL (ref 7–18)
Calcium, Total: 8 mg/dL — ABNORMAL LOW (ref 8.5–10.1)
Chloride: 101 mmol/L (ref 98–107)
Co2: 28 mmol/L (ref 21–32)
Creatinine: 1.57 mg/dL — ABNORMAL HIGH (ref 0.60–1.30)
GFR CALC AF AMER: 58 — AB
GFR CALC NON AF AMER: 50 — AB
GLUCOSE: 95 mg/dL (ref 65–99)
Osmolality: 266 (ref 275–301)
Potassium: 3.8 mmol/L (ref 3.5–5.1)
Sodium: 133 mmol/L — ABNORMAL LOW (ref 136–145)

## 2014-03-05 LAB — CBC WITH DIFFERENTIAL/PLATELET
BASOS PCT: 0.2 %
Basophil #: 0 10*3/uL (ref 0.0–0.1)
EOS ABS: 0.1 10*3/uL (ref 0.0–0.7)
Eosinophil %: 2.8 %
HCT: 30.7 % — AB (ref 40.0–52.0)
HGB: 10.7 g/dL — ABNORMAL LOW (ref 13.0–18.0)
Lymphocyte #: 0.6 10*3/uL — ABNORMAL LOW (ref 1.0–3.6)
Lymphocyte %: 17.4 %
MCH: 29.2 pg (ref 26.0–34.0)
MCHC: 35 g/dL (ref 32.0–36.0)
MCV: 84 fL (ref 80–100)
Monocyte #: 0.3 x10 3/mm (ref 0.2–1.0)
Monocyte %: 7.5 %
NEUTROS ABS: 2.4 10*3/uL (ref 1.4–6.5)
Neutrophil %: 72.1 %
PLATELETS: 132 10*3/uL — AB (ref 150–440)
RBC: 3.68 10*6/uL — ABNORMAL LOW (ref 4.40–5.90)
RDW: 14.3 % (ref 11.5–14.5)
WBC: 3.3 10*3/uL — ABNORMAL LOW (ref 3.8–10.6)

## 2014-03-06 LAB — PROTIME-INR
INR: 1.6
Prothrombin Time: 19.1 secs — ABNORMAL HIGH (ref 11.5–14.7)

## 2014-03-08 ENCOUNTER — Inpatient Hospital Stay: Payer: Self-pay | Admitting: Internal Medicine

## 2014-03-08 LAB — BASIC METABOLIC PANEL
Anion Gap: 6 — ABNORMAL LOW (ref 7–16)
BUN: 15 mg/dL (ref 7–18)
CO2: 25 mmol/L (ref 21–32)
Calcium, Total: 8.6 mg/dL (ref 8.5–10.1)
Chloride: 101 mmol/L (ref 98–107)
Creatinine: 1.6 mg/dL — ABNORMAL HIGH (ref 0.60–1.30)
EGFR (African American): 57 — ABNORMAL LOW
EGFR (Non-African Amer.): 49 — ABNORMAL LOW
Glucose: 83 mg/dL (ref 65–99)
Osmolality: 264 (ref 275–301)
POTASSIUM: 3.8 mmol/L (ref 3.5–5.1)
Sodium: 132 mmol/L — ABNORMAL LOW (ref 136–145)

## 2014-03-08 LAB — CBC
HCT: 35.2 % — AB (ref 40.0–52.0)
HGB: 11.9 g/dL — ABNORMAL LOW (ref 13.0–18.0)
MCH: 28.6 pg (ref 26.0–34.0)
MCHC: 33.8 g/dL (ref 32.0–36.0)
MCV: 85 fL (ref 80–100)
PLATELETS: 162 10*3/uL (ref 150–440)
RBC: 4.16 10*6/uL — ABNORMAL LOW (ref 4.40–5.90)
RDW: 14.7 % — AB (ref 11.5–14.5)
WBC: 5 10*3/uL (ref 3.8–10.6)

## 2014-03-08 LAB — TROPONIN I: Troponin-I: <0.02 ng/mL

## 2014-03-08 LAB — PROTIME-INR
INR: 1.7
Prothrombin Time: 19.4 secs — ABNORMAL HIGH (ref 11.5–14.7)

## 2014-03-09 LAB — BASIC METABOLIC PANEL
ANION GAP: 6 — AB (ref 7–16)
BUN: 13 mg/dL (ref 7–18)
CALCIUM: 8.1 mg/dL — AB (ref 8.5–10.1)
CREATININE: 1.54 mg/dL — AB (ref 0.60–1.30)
Chloride: 103 mmol/L (ref 98–107)
Co2: 28 mmol/L (ref 21–32)
EGFR (African American): 59 — ABNORMAL LOW
EGFR (Non-African Amer.): 51 — ABNORMAL LOW
Glucose: 76 mg/dL (ref 65–99)
Osmolality: 273 (ref 275–301)
Potassium: 3.6 mmol/L (ref 3.5–5.1)
SODIUM: 137 mmol/L (ref 136–145)

## 2014-03-09 LAB — PROTIME-INR
INR: 2.4
Prothrombin Time: 25.2 secs — ABNORMAL HIGH (ref 11.5–14.7)

## 2014-03-09 LAB — LIPASE, BLOOD: LIPASE: 96 U/L (ref 73–393)

## 2014-03-10 LAB — PROTIME-INR
INR: 3.5
Prothrombin Time: 34.1 secs — ABNORMAL HIGH (ref 11.5–14.7)

## 2014-03-10 LAB — COMPREHENSIVE METABOLIC PANEL
ALBUMIN: 2.5 g/dL — AB (ref 3.4–5.0)
Alkaline Phosphatase: 86 U/L
Anion Gap: 6 — ABNORMAL LOW (ref 7–16)
BUN: 9 mg/dL (ref 7–18)
Bilirubin,Total: 0.4 mg/dL (ref 0.2–1.0)
CO2: 27 mmol/L (ref 21–32)
Calcium, Total: 8.2 mg/dL — ABNORMAL LOW (ref 8.5–10.1)
Chloride: 104 mmol/L (ref 98–107)
Creatinine: 1.53 mg/dL — ABNORMAL HIGH (ref 0.60–1.30)
EGFR (African American): 60 — ABNORMAL LOW
EGFR (Non-African Amer.): 52 — ABNORMAL LOW
Glucose: 81 mg/dL (ref 65–99)
OSMOLALITY: 272 (ref 275–301)
POTASSIUM: 3.6 mmol/L (ref 3.5–5.1)
SGOT(AST): 22 U/L (ref 15–37)
SGPT (ALT): 21 U/L (ref 12–78)
SODIUM: 137 mmol/L (ref 136–145)
TOTAL PROTEIN: 7.1 g/dL (ref 6.4–8.2)

## 2014-03-10 LAB — CBC WITH DIFFERENTIAL/PLATELET
BASOS PCT: 0.7 %
Basophil #: 0 10*3/uL (ref 0.0–0.1)
Eosinophil #: 0.1 10*3/uL (ref 0.0–0.7)
Eosinophil %: 2.4 %
HCT: 30.7 % — ABNORMAL LOW (ref 40.0–52.0)
HGB: 10.5 g/dL — ABNORMAL LOW (ref 13.0–18.0)
LYMPHS ABS: 0.4 10*3/uL — AB (ref 1.0–3.6)
Lymphocyte %: 19.4 %
MCH: 28.6 pg (ref 26.0–34.0)
MCHC: 34.2 g/dL (ref 32.0–36.0)
MCV: 84 fL (ref 80–100)
MONO ABS: 0.2 x10 3/mm (ref 0.2–1.0)
Monocyte %: 10.6 %
Neutrophil #: 1.5 10*3/uL (ref 1.4–6.5)
Neutrophil %: 66.9 %
PLATELETS: 146 10*3/uL — AB (ref 150–440)
RBC: 3.68 10*6/uL — ABNORMAL LOW (ref 4.40–5.90)
RDW: 14.4 % (ref 11.5–14.5)
WBC: 2.2 10*3/uL — AB (ref 3.8–10.6)

## 2014-03-11 LAB — CBC WITH DIFFERENTIAL/PLATELET
BASOS ABS: 0 10*3/uL (ref 0.0–0.1)
Basophil %: 1 %
EOS ABS: 0.1 10*3/uL (ref 0.0–0.7)
Eosinophil %: 2.3 %
HCT: 35.2 % — AB (ref 40.0–52.0)
HGB: 11.9 g/dL — ABNORMAL LOW (ref 13.0–18.0)
LYMPHS ABS: 0.8 10*3/uL — AB (ref 1.0–3.6)
Lymphocyte %: 24.3 %
MCH: 28.3 pg (ref 26.0–34.0)
MCHC: 33.7 g/dL (ref 32.0–36.0)
MCV: 84 fL (ref 80–100)
Monocyte #: 0.3 x10 3/mm (ref 0.2–1.0)
Monocyte %: 7.9 %
NEUTROS ABS: 2.1 10*3/uL (ref 1.4–6.5)
NEUTROS PCT: 64.5 %
Platelet: 178 10*3/uL (ref 150–440)
RBC: 4.2 10*6/uL — ABNORMAL LOW (ref 4.40–5.90)
RDW: 14.7 % — AB (ref 11.5–14.5)
WBC: 3.3 10*3/uL — ABNORMAL LOW (ref 3.8–10.6)

## 2014-03-11 LAB — COMPREHENSIVE METABOLIC PANEL
AST: 38 U/L — AB (ref 15–37)
Albumin: 3.3 g/dL — ABNORMAL LOW (ref 3.4–5.0)
Alkaline Phosphatase: 104 U/L
Anion Gap: 5 — ABNORMAL LOW (ref 7–16)
BUN: 10 mg/dL (ref 7–18)
Bilirubin,Total: 0.5 mg/dL (ref 0.2–1.0)
CALCIUM: 8.5 mg/dL (ref 8.5–10.1)
CO2: 28 mmol/L (ref 21–32)
Chloride: 100 mmol/L (ref 98–107)
Creatinine: 1.66 mg/dL — ABNORMAL HIGH (ref 0.60–1.30)
EGFR (African American): 54 — ABNORMAL LOW
EGFR (Non-African Amer.): 47 — ABNORMAL LOW
Glucose: 76 mg/dL (ref 65–99)
Osmolality: 264 (ref 275–301)
Potassium: 3.5 mmol/L (ref 3.5–5.1)
SGPT (ALT): 28 U/L (ref 12–78)
SODIUM: 133 mmol/L — AB (ref 136–145)
TOTAL PROTEIN: 8.6 g/dL — AB (ref 6.4–8.2)

## 2014-03-12 ENCOUNTER — Inpatient Hospital Stay: Payer: Self-pay | Admitting: Internal Medicine

## 2014-03-13 LAB — CBC WITH DIFFERENTIAL/PLATELET
Basophil #: 0 10*3/uL (ref 0.0–0.1)
Basophil %: 0.3 %
EOS ABS: 0 10*3/uL (ref 0.0–0.7)
Eosinophil %: 0.1 %
HCT: 33.3 % — AB (ref 40.0–52.0)
HGB: 11.4 g/dL — ABNORMAL LOW (ref 13.0–18.0)
LYMPHS ABS: 0.3 10*3/uL — AB (ref 1.0–3.6)
LYMPHS PCT: 14.6 %
MCH: 28.2 pg (ref 26.0–34.0)
MCHC: 34.2 g/dL (ref 32.0–36.0)
MCV: 83 fL (ref 80–100)
Monocyte #: 0.1 x10 3/mm — ABNORMAL LOW (ref 0.2–1.0)
Monocyte %: 2.9 %
Neutrophil #: 1.9 10*3/uL (ref 1.4–6.5)
Neutrophil %: 82.1 %
Platelet: 162 10*3/uL (ref 150–440)
RBC: 4.02 10*6/uL — AB (ref 4.40–5.90)
RDW: 14.2 % (ref 11.5–14.5)
WBC: 2.3 10*3/uL — AB (ref 3.8–10.6)

## 2014-03-13 LAB — COMPREHENSIVE METABOLIC PANEL
ALBUMIN: 2.9 g/dL — AB (ref 3.4–5.0)
ALK PHOS: 91 U/L
ALT: 20 U/L (ref 12–78)
Anion Gap: 5 — ABNORMAL LOW (ref 7–16)
BILIRUBIN TOTAL: 0.4 mg/dL (ref 0.2–1.0)
BUN: 11 mg/dL (ref 7–18)
Calcium, Total: 8.1 mg/dL — ABNORMAL LOW (ref 8.5–10.1)
Chloride: 105 mmol/L (ref 98–107)
Co2: 25 mmol/L (ref 21–32)
Creatinine: 1.61 mg/dL — ABNORMAL HIGH (ref 0.60–1.30)
EGFR (African American): 56 — ABNORMAL LOW
EGFR (Non-African Amer.): 48 — ABNORMAL LOW
GLUCOSE: 131 mg/dL — AB (ref 65–99)
OSMOLALITY: 271 (ref 275–301)
Potassium: 3.9 mmol/L (ref 3.5–5.1)
SGOT(AST): 26 U/L (ref 15–37)
Sodium: 135 mmol/L — ABNORMAL LOW (ref 136–145)
TOTAL PROTEIN: 7.4 g/dL (ref 6.4–8.2)

## 2014-03-14 LAB — CBC WITH DIFFERENTIAL/PLATELET
BASOS ABS: 0 10*3/uL (ref 0.0–0.1)
Basophil %: 0.3 %
Eosinophil #: 0 10*3/uL (ref 0.0–0.7)
Eosinophil %: 0.6 %
HCT: 31.8 % — ABNORMAL LOW (ref 40.0–52.0)
HGB: 10.9 g/dL — ABNORMAL LOW (ref 13.0–18.0)
LYMPHS ABS: 1.1 10*3/uL (ref 1.0–3.6)
LYMPHS PCT: 34.6 %
MCH: 28.5 pg (ref 26.0–34.0)
MCHC: 34.4 g/dL (ref 32.0–36.0)
MCV: 83 fL (ref 80–100)
Monocyte #: 0.4 x10 3/mm (ref 0.2–1.0)
Monocyte %: 11.2 %
NEUTROS PCT: 53.3 %
Neutrophil #: 1.7 10*3/uL (ref 1.4–6.5)
Platelet: 157 10*3/uL (ref 150–440)
RBC: 3.83 10*6/uL — AB (ref 4.40–5.90)
RDW: 14.7 % — AB (ref 11.5–14.5)
WBC: 3.1 10*3/uL — AB (ref 3.8–10.6)

## 2014-03-14 LAB — PROTIME-INR
INR: 3
INR: 2
PROTHROMBIN TIME: 22.4 s — AB (ref 11.5–14.7)
Prothrombin Time: 30.4 secs — ABNORMAL HIGH (ref 11.5–14.7)

## 2014-03-15 LAB — LACTATE DEHYDROGENASE: LDH: 156 U/L (ref 85–241)

## 2014-03-15 LAB — CBC WITH DIFFERENTIAL/PLATELET
BASOS ABS: 0 10*3/uL (ref 0.0–0.1)
BASOS PCT: 0.4 %
EOS ABS: 0 10*3/uL (ref 0.0–0.7)
Eosinophil %: 1.1 %
HCT: 32.7 % — ABNORMAL LOW (ref 40.0–52.0)
HGB: 11 g/dL — ABNORMAL LOW (ref 13.0–18.0)
LYMPHS PCT: 13.5 %
Lymphocyte #: 0.4 10*3/uL — ABNORMAL LOW (ref 1.0–3.6)
MCH: 28.3 pg (ref 26.0–34.0)
MCHC: 33.8 g/dL (ref 32.0–36.0)
MCV: 84 fL (ref 80–100)
MONO ABS: 0.2 x10 3/mm (ref 0.2–1.0)
MONOS PCT: 6.9 %
NEUTROS PCT: 78.1 %
Neutrophil #: 2.2 10*3/uL (ref 1.4–6.5)
Platelet: 146 10*3/uL — ABNORMAL LOW (ref 150–440)
RBC: 3.9 10*6/uL — ABNORMAL LOW (ref 4.40–5.90)
RDW: 14.6 % — AB (ref 11.5–14.5)
WBC: 2.9 10*3/uL — ABNORMAL LOW (ref 3.8–10.6)

## 2014-03-15 LAB — RETICULOCYTES
ABSOLUTE RETIC COUNT: 0.0222 10*6/uL (ref 0.019–0.186)
Reticulocyte: 0.57 % (ref 0.4–3.1)

## 2014-03-15 LAB — BASIC METABOLIC PANEL
ANION GAP: 6 — AB (ref 7–16)
BUN: 12 mg/dL (ref 7–18)
CHLORIDE: 105 mmol/L (ref 98–107)
CREATININE: 1.59 mg/dL — AB (ref 0.60–1.30)
Calcium, Total: 8 mg/dL — ABNORMAL LOW (ref 8.5–10.1)
Co2: 27 mmol/L (ref 21–32)
EGFR (African American): 57 — ABNORMAL LOW
GFR CALC NON AF AMER: 49 — AB
Glucose: 86 mg/dL (ref 65–99)
Osmolality: 275 (ref 275–301)
POTASSIUM: 3.5 mmol/L (ref 3.5–5.1)
SODIUM: 138 mmol/L (ref 136–145)

## 2014-03-15 LAB — IRON AND TIBC
IRON SATURATION: 28 %
IRON: 49 ug/dL — AB (ref 65–175)
Iron Bind.Cap.(Total): 172 ug/dL — ABNORMAL LOW (ref 250–450)
UNBOUND IRON-BIND. CAP.: 123 ug/dL

## 2014-03-15 LAB — PROTIME-INR
INR: 1.5
PROTHROMBIN TIME: 17.8 s — AB (ref 11.5–14.7)

## 2014-03-16 LAB — CBC WITH DIFFERENTIAL/PLATELET
BASOS ABS: 0 10*3/uL (ref 0.0–0.1)
Basophil %: 0.4 %
Eosinophil #: 0 10*3/uL (ref 0.0–0.7)
Eosinophil %: 2 %
HCT: 27.6 % — AB (ref 40.0–52.0)
HGB: 9.6 g/dL — AB (ref 13.0–18.0)
LYMPHS ABS: 0.5 10*3/uL — AB (ref 1.0–3.6)
Lymphocyte %: 21.3 %
MCH: 28.7 pg (ref 26.0–34.0)
MCHC: 34.7 g/dL (ref 32.0–36.0)
MCV: 83 fL (ref 80–100)
MONO ABS: 0.2 x10 3/mm (ref 0.2–1.0)
Monocyte %: 7.1 %
Neutrophil #: 1.7 10*3/uL (ref 1.4–6.5)
Neutrophil %: 69.2 %
Platelet: 124 10*3/uL — ABNORMAL LOW (ref 150–440)
RBC: 3.33 10*6/uL — ABNORMAL LOW (ref 4.40–5.90)
RDW: 14.5 % (ref 11.5–14.5)
WBC: 2.5 10*3/uL — ABNORMAL LOW (ref 3.8–10.6)

## 2014-03-16 LAB — PROTIME-INR
INR: 1.4
Prothrombin Time: 16.5 secs — ABNORMAL HIGH (ref 11.5–14.7)

## 2014-03-16 LAB — CREATININE, SERUM
CREATININE: 1.43 mg/dL — AB (ref 0.60–1.30)
EGFR (African American): >60
EGFR (Non-African Amer.): 56 — ABNORMAL LOW

## 2014-03-17 LAB — CBC CANCER CENTER
BASOS PCT: 1.2 %
Basophil #: 0 x10 3/mm (ref 0.0–0.1)
EOS ABS: 0.1 x10 3/mm (ref 0.0–0.7)
Eosinophil %: 1.9 %
HCT: 34.6 % — AB (ref 40.0–52.0)
HGB: 11.4 g/dL — ABNORMAL LOW (ref 13.0–18.0)
LYMPHS ABS: 0.6 x10 3/mm — AB (ref 1.0–3.6)
Lymphocyte %: 18.3 %
MCH: 27.3 pg (ref 26.0–34.0)
MCHC: 32.9 g/dL (ref 32.0–36.0)
MCV: 83 fL (ref 80–100)
MONO ABS: 0.2 x10 3/mm (ref 0.2–1.0)
MONOS PCT: 5.6 %
Neutrophil #: 2.6 x10 3/mm (ref 1.4–6.5)
Neutrophil %: 73 %
Platelet: 171 x10 3/mm (ref 150–440)
RBC: 4.17 10*6/uL — AB (ref 4.40–5.90)
RDW: 14.4 % (ref 11.5–14.5)
WBC: 3.5 x10 3/mm — ABNORMAL LOW (ref 3.8–10.6)

## 2014-03-17 LAB — CREATININE, SERUM
Creatinine: 1.71 mg/dL — ABNORMAL HIGH (ref 0.60–1.30)
GFR CALC AF AMER: 52 — AB
GFR CALC NON AF AMER: 45 — AB

## 2014-03-17 LAB — PROTIME-INR
INR: 1.3
PROTHROMBIN TIME: 15.6 s — AB (ref 11.5–14.7)

## 2014-03-17 LAB — PROT IMMUNOELECTROPHORES(ARMC)

## 2014-03-17 LAB — KAPPA/LAMBDA FREE LIGHT CHAINS (ARMC)

## 2014-03-20 ENCOUNTER — Observation Stay: Payer: Self-pay | Admitting: Internal Medicine

## 2014-03-20 LAB — CBC CANCER CENTER
BASOS ABS: 0 x10 3/mm (ref 0.0–0.1)
BASOS PCT: 0.5 %
EOS ABS: 0.1 x10 3/mm (ref 0.0–0.7)
Eosinophil %: 2.3 %
HCT: 33.6 % — ABNORMAL LOW (ref 40.0–52.0)
HGB: 11.3 g/dL — ABNORMAL LOW (ref 13.0–18.0)
LYMPHS PCT: 22.6 %
Lymphocyte #: 0.7 x10 3/mm — ABNORMAL LOW (ref 1.0–3.6)
MCH: 28 pg (ref 26.0–34.0)
MCHC: 33.6 g/dL (ref 32.0–36.0)
MCV: 83 fL (ref 80–100)
MONO ABS: 0.3 x10 3/mm (ref 0.2–1.0)
Monocyte %: 8.5 %
Neutrophil #: 2 x10 3/mm (ref 1.4–6.5)
Neutrophil %: 66.1 %
Platelet: 145 x10 3/mm — ABNORMAL LOW (ref 150–440)
RBC: 4.04 10*6/uL — ABNORMAL LOW (ref 4.40–5.90)
RDW: 14.9 % — ABNORMAL HIGH (ref 11.5–14.5)
WBC: 3.1 x10 3/mm — ABNORMAL LOW (ref 3.8–10.6)

## 2014-03-20 LAB — BASIC METABOLIC PANEL
Anion Gap: 6 — ABNORMAL LOW (ref 7–16)
BUN: 12 mg/dL (ref 7–18)
CALCIUM: 8.3 mg/dL — AB (ref 8.5–10.1)
CO2: 31 mmol/L (ref 21–32)
Chloride: 97 mmol/L — ABNORMAL LOW (ref 98–107)
Creatinine: 1.9 mg/dL — ABNORMAL HIGH (ref 0.60–1.30)
EGFR (Non-African Amer.): 40 — ABNORMAL LOW
GFR CALC AF AMER: 46 — AB
Glucose: 79 mg/dL (ref 65–99)
Osmolality: 267 (ref 275–301)
Potassium: 3.5 mmol/L (ref 3.5–5.1)
Sodium: 134 mmol/L — ABNORMAL LOW (ref 136–145)

## 2014-03-20 LAB — URINALYSIS, COMPLETE
BILIRUBIN, UR: NEGATIVE
Glucose,UR: NEGATIVE mg/dL (ref 0–75)
Ketone: NEGATIVE
Leukocyte Esterase: NEGATIVE
NITRITE: NEGATIVE
Ph: 5 (ref 4.5–8.0)
Protein: 100
SQUAMOUS EPITHELIAL: NONE SEEN
Specific Gravity: 1.012 (ref 1.003–1.030)

## 2014-03-20 LAB — BILIRUBIN, TOTAL: BILIRUBIN TOTAL: 0.6 mg/dL (ref 0.2–1.0)

## 2014-03-20 LAB — LACTATE DEHYDROGENASE: LDH: 287 U/L — ABNORMAL HIGH (ref 85–241)

## 2014-03-20 LAB — PROTIME-INR
INR: 2
Prothrombin Time: 22.6 secs — ABNORMAL HIGH (ref 11.5–14.7)

## 2014-03-21 LAB — COMPREHENSIVE METABOLIC PANEL
ALBUMIN: 2.6 g/dL — AB (ref 3.4–5.0)
ALT: 18 U/L (ref 12–78)
Alkaline Phosphatase: 87 U/L
Anion Gap: 3 — ABNORMAL LOW (ref 7–16)
BILIRUBIN TOTAL: 0.4 mg/dL (ref 0.2–1.0)
BUN: 12 mg/dL (ref 7–18)
CALCIUM: 7.6 mg/dL — AB (ref 8.5–10.1)
CHLORIDE: 103 mmol/L (ref 98–107)
CREATININE: 1.84 mg/dL — AB (ref 0.60–1.30)
Co2: 29 mmol/L (ref 21–32)
EGFR (African American): 48 — ABNORMAL LOW
EGFR (Non-African Amer.): 41 — ABNORMAL LOW
Glucose: 83 mg/dL (ref 65–99)
Osmolality: 269 (ref 275–301)
Potassium: 3.5 mmol/L (ref 3.5–5.1)
SGOT(AST): 24 U/L (ref 15–37)
Sodium: 135 mmol/L — ABNORMAL LOW (ref 136–145)
TOTAL PROTEIN: 6.8 g/dL (ref 6.4–8.2)

## 2014-03-21 LAB — CBC WITH DIFFERENTIAL/PLATELET
BASOS ABS: 0 10*3/uL (ref 0.0–0.1)
Basophil #: 0 10*3/uL (ref 0.0–0.1)
Basophil %: 0.5 %
Basophil %: 0.8 %
EOS ABS: 0.1 10*3/uL (ref 0.0–0.7)
EOS PCT: 4 %
Eosinophil #: 0.1 10*3/uL (ref 0.0–0.7)
Eosinophil %: 3.8 %
HCT: 29.6 % — ABNORMAL LOW (ref 40.0–52.0)
HCT: 32.1 % — ABNORMAL LOW (ref 40.0–52.0)
HGB: 9.9 g/dL — ABNORMAL LOW (ref 13.0–18.0)
HGB: 10.7 g/dL — ABNORMAL LOW (ref 13.0–18.0)
LYMPHS PCT: 25.2 %
Lymphocyte #: 0.5 10*3/uL — ABNORMAL LOW (ref 1.0–3.6)
Lymphocyte #: 0.5 10*3/uL — ABNORMAL LOW (ref 1.0–3.6)
Lymphocyte %: 21.6 %
MCH: 28 pg (ref 26.0–34.0)
MCH: 28 pg (ref 26.0–34.0)
MCHC: 33.4 g/dL (ref 32.0–36.0)
MCHC: 33.5 g/dL (ref 32.0–36.0)
MCV: 84 fL (ref 80–100)
MCV: 84 fL (ref 80–100)
MONOS PCT: 11.5 %
Monocyte #: 0.2 x10 3/mm (ref 0.2–1.0)
Monocyte #: 0.2 x10 3/mm (ref 0.2–1.0)
Monocyte %: 6.6 %
Neutrophil #: 1.2 10*3/uL — ABNORMAL LOW (ref 1.4–6.5)
Neutrophil #: 1.6 10*3/uL (ref 1.4–6.5)
Neutrophil %: 58.8 %
Neutrophil %: 67.2 %
Platelet: 120 10*3/uL — ABNORMAL LOW (ref 150–440)
Platelet: 130 10*3/uL — ABNORMAL LOW (ref 150–440)
RBC: 3.54 10*6/uL — AB (ref 4.40–5.90)
RBC: 3.83 10*6/uL — ABNORMAL LOW (ref 4.40–5.90)
RDW: 14.7 % — AB (ref 11.5–14.5)
RDW: 14.7 % — ABNORMAL HIGH (ref 11.5–14.5)
WBC: 2 10*3/uL — CL (ref 3.8–10.6)
WBC: 2.4 10*3/uL — ABNORMAL LOW (ref 3.8–10.6)

## 2014-03-21 LAB — LACTATE DEHYDROGENASE: LDH: 159 U/L (ref 85–241)

## 2014-03-21 LAB — CREATININE, SERUM
Creatinine: 1.83 mg/dL — ABNORMAL HIGH (ref 0.60–1.30)
EGFR (African American): 48 — ABNORMAL LOW
EGFR (Non-African Amer.): 41 — ABNORMAL LOW

## 2014-03-21 LAB — PROTEIN / CREATININE RATIO, URINE
Creatinine, Urine: 28.8 mg/dL — ABNORMAL LOW (ref 30.0–125.0)
PROTEIN, RANDOM URINE: 15 mg/dL — AB (ref 0–12)
Protein/Creat. Ratio: 521 mg/gCREAT — ABNORMAL HIGH (ref 0–200)

## 2014-03-21 LAB — SODIUM, URINE, RANDOM: SODIUM, URINE RANDOM: 85 mmol/L (ref 20–110)

## 2014-03-21 LAB — PROTIME-INR
INR: 2.3
Prothrombin Time: 24.9 secs — ABNORMAL HIGH (ref 11.5–14.7)

## 2014-03-21 LAB — MAGNESIUM: Magnesium: 1.5 mg/dL — ABNORMAL LOW

## 2014-03-22 ENCOUNTER — Ambulatory Visit: Payer: Self-pay | Admitting: Internal Medicine

## 2014-03-23 LAB — PROTEIN ELECTROPHORESIS(ARMC)

## 2014-03-23 LAB — UR PROT ELECTROPHORESIS, URINE RANDOM

## 2014-03-24 LAB — BASIC METABOLIC PANEL
Anion Gap: 8 (ref 7–16)
BUN: 21 mg/dL — AB (ref 7–18)
CHLORIDE: 102 mmol/L (ref 98–107)
CO2: 29 mmol/L (ref 21–32)
CREATININE: 1.93 mg/dL — AB (ref 0.60–1.30)
Calcium, Total: 8.7 mg/dL (ref 8.5–10.1)
EGFR (African American): 45 — ABNORMAL LOW
GFR CALC NON AF AMER: 39 — AB
Glucose: 125 mg/dL — ABNORMAL HIGH (ref 65–99)
OSMOLALITY: 282 (ref 275–301)
Potassium: 3.3 mmol/L — ABNORMAL LOW (ref 3.5–5.1)
SODIUM: 139 mmol/L (ref 136–145)

## 2014-03-24 LAB — CBC CANCER CENTER
BASOS PCT: 0.5 %
Basophil #: 0 x10 3/mm (ref 0.0–0.1)
Eosinophil #: 0 x10 3/mm (ref 0.0–0.7)
Eosinophil %: 1.7 %
HCT: 32.7 % — AB (ref 40.0–52.0)
HGB: 10.7 g/dL — ABNORMAL LOW (ref 13.0–18.0)
LYMPHS ABS: 0.4 x10 3/mm — AB (ref 1.0–3.6)
LYMPHS PCT: 13.7 %
MCH: 27.2 pg (ref 26.0–34.0)
MCHC: 32.6 g/dL (ref 32.0–36.0)
MCV: 83 fL (ref 80–100)
Monocyte #: 0.2 x10 3/mm (ref 0.2–1.0)
Monocyte %: 5.9 %
Neutrophil #: 2.1 x10 3/mm (ref 1.4–6.5)
Neutrophil %: 78.2 %
PLATELETS: 153 x10 3/mm (ref 150–440)
RBC: 3.92 10*6/uL — ABNORMAL LOW (ref 4.40–5.90)
RDW: 14.9 % — ABNORMAL HIGH (ref 11.5–14.5)
WBC: 2.7 x10 3/mm — AB (ref 3.8–10.6)

## 2014-03-24 LAB — PROTIME-INR
INR: 2.6
PROTHROMBIN TIME: 27.4 s — AB (ref 11.5–14.7)

## 2014-03-27 LAB — CREATININE, SERUM
Creatinine: 2.27 mg/dL — ABNORMAL HIGH (ref 0.60–1.30)
EGFR (African American): 37 — ABNORMAL LOW
GFR CALC NON AF AMER: 32 — AB

## 2014-03-27 LAB — PROTIME-INR
INR: 3.8
PROTHROMBIN TIME: 36.5 s — AB (ref 11.5–14.7)

## 2014-03-28 ENCOUNTER — Inpatient Hospital Stay: Payer: Self-pay | Admitting: Student

## 2014-03-28 LAB — COMPREHENSIVE METABOLIC PANEL
ALT: 20 U/L (ref 12–78)
Albumin: 2.9 g/dL — ABNORMAL LOW (ref 3.4–5.0)
Alkaline Phosphatase: 94 U/L
Anion Gap: 3 — ABNORMAL LOW (ref 7–16)
BILIRUBIN TOTAL: 0.3 mg/dL (ref 0.2–1.0)
BUN: 27 mg/dL — ABNORMAL HIGH (ref 7–18)
Calcium, Total: 7.6 mg/dL — ABNORMAL LOW (ref 8.5–10.1)
Chloride: 103 mmol/L (ref 98–107)
Co2: 30 mmol/L (ref 21–32)
Creatinine: 2.62 mg/dL — ABNORMAL HIGH (ref 0.60–1.30)
EGFR (African American): 31 — ABNORMAL LOW
GFR CALC NON AF AMER: 27 — AB
Glucose: 94 mg/dL (ref 65–99)
Osmolality: 277 (ref 275–301)
POTASSIUM: 3.7 mmol/L (ref 3.5–5.1)
SGOT(AST): 24 U/L (ref 15–37)
Sodium: 136 mmol/L (ref 136–145)
Total Protein: 7.6 g/dL (ref 6.4–8.2)

## 2014-03-28 LAB — CBC WITH DIFFERENTIAL/PLATELET
Basophil #: 0 10*3/uL (ref 0.0–0.1)
Basophil %: 0.4 %
Eosinophil #: 0.1 10*3/uL (ref 0.0–0.7)
Eosinophil %: 2.8 %
HCT: 28.5 % — ABNORMAL LOW (ref 40.0–52.0)
HGB: 9.7 g/dL — ABNORMAL LOW (ref 13.0–18.0)
LYMPHS PCT: 17.3 %
Lymphocyte #: 0.5 10*3/uL — ABNORMAL LOW (ref 1.0–3.6)
MCH: 28.4 pg (ref 26.0–34.0)
MCHC: 34.2 g/dL (ref 32.0–36.0)
MCV: 83 fL (ref 80–100)
Monocyte #: 0.2 x10 3/mm (ref 0.2–1.0)
Monocyte %: 7.8 %
NEUTROS ABS: 2.2 10*3/uL (ref 1.4–6.5)
NEUTROS PCT: 71.7 %
Platelet: 144 10*3/uL — ABNORMAL LOW (ref 150–440)
RBC: 3.43 10*6/uL — ABNORMAL LOW (ref 4.40–5.90)
RDW: 14.9 % — ABNORMAL HIGH (ref 11.5–14.5)
WBC: 3.1 10*3/uL — ABNORMAL LOW (ref 3.8–10.6)

## 2014-03-29 LAB — COMPREHENSIVE METABOLIC PANEL
ALBUMIN: 2.9 g/dL — AB (ref 3.4–5.0)
ALK PHOS: 85 U/L
ALT: 17 U/L (ref 12–78)
ANION GAP: 4 — AB (ref 7–16)
BILIRUBIN TOTAL: 0.6 mg/dL (ref 0.2–1.0)
BUN: 25 mg/dL — ABNORMAL HIGH (ref 7–18)
CALCIUM: 8.2 mg/dL — AB (ref 8.5–10.1)
CO2: 28 mmol/L (ref 21–32)
Chloride: 105 mmol/L (ref 98–107)
Creatinine: 2.48 mg/dL — ABNORMAL HIGH (ref 0.60–1.30)
EGFR (Non-African Amer.): 29 — ABNORMAL LOW
GFR CALC AF AMER: 33 — AB
GLUCOSE: 86 mg/dL (ref 65–99)
Osmolality: 278 (ref 275–301)
POTASSIUM: 3.6 mmol/L (ref 3.5–5.1)
SGOT(AST): 18 U/L (ref 15–37)
Sodium: 137 mmol/L (ref 136–145)
TOTAL PROTEIN: 7.6 g/dL (ref 6.4–8.2)

## 2014-03-29 LAB — URINALYSIS, COMPLETE
Bacteria: NONE SEEN
Bilirubin,UR: NEGATIVE
GLUCOSE, UR: NEGATIVE mg/dL (ref 0–75)
Ketone: NEGATIVE
Leukocyte Esterase: NEGATIVE
Nitrite: NEGATIVE
PH: 7 (ref 4.5–8.0)
Protein: NEGATIVE
SPECIFIC GRAVITY: 1.006 (ref 1.003–1.030)
Squamous Epithelial: NONE SEEN

## 2014-03-29 LAB — CBC WITH DIFFERENTIAL/PLATELET
BASOS PCT: 0.5 %
Basophil #: 0 10*3/uL (ref 0.0–0.1)
EOS ABS: 0.1 10*3/uL (ref 0.0–0.7)
EOS PCT: 2.9 %
HCT: 30.4 % — AB (ref 40.0–52.0)
HGB: 10.2 g/dL — ABNORMAL LOW (ref 13.0–18.0)
Lymphocyte #: 0.6 10*3/uL — ABNORMAL LOW (ref 1.0–3.6)
Lymphocyte %: 21.5 %
MCH: 28 pg (ref 26.0–34.0)
MCHC: 33.6 g/dL (ref 32.0–36.0)
MCV: 83 fL (ref 80–100)
Monocyte #: 0.2 x10 3/mm (ref 0.2–1.0)
Monocyte %: 7.9 %
Neutrophil #: 2 10*3/uL (ref 1.4–6.5)
Neutrophil %: 67.2 %
Platelet: 139 10*3/uL — ABNORMAL LOW (ref 150–440)
RBC: 3.65 10*6/uL — ABNORMAL LOW (ref 4.40–5.90)
RDW: 14.7 % — AB (ref 11.5–14.5)
WBC: 2.9 10*3/uL — ABNORMAL LOW (ref 3.8–10.6)

## 2014-03-29 LAB — PROTIME-INR
INR: 1.7
INR: 1.5
PROTHROMBIN TIME: 19.2 s — AB (ref 11.5–14.7)
PROTHROMBIN TIME: 17.9 s — AB (ref 11.5–14.7)

## 2014-03-29 LAB — MAGNESIUM: MAGNESIUM: 1.6 mg/dL — AB

## 2014-03-29 LAB — APTT: ACTIVATED PTT: 32.5 s (ref 23.6–35.9)

## 2014-03-30 LAB — BASIC METABOLIC PANEL
Anion Gap: 7 (ref 7–16)
BUN: 23 mg/dL — ABNORMAL HIGH (ref 7–18)
CHLORIDE: 107 mmol/L (ref 98–107)
CO2: 25 mmol/L (ref 21–32)
Calcium, Total: 7.9 mg/dL — ABNORMAL LOW (ref 8.5–10.1)
Creatinine: 2.33 mg/dL — ABNORMAL HIGH (ref 0.60–1.30)
GFR CALC AF AMER: 36 — AB
GFR CALC NON AF AMER: 31 — AB
Glucose: 86 mg/dL (ref 65–99)
OSMOLALITY: 281 (ref 275–301)
Potassium: 4 mmol/L (ref 3.5–5.1)
Sodium: 139 mmol/L (ref 136–145)

## 2014-03-30 LAB — CBC WITH DIFFERENTIAL/PLATELET
Basophil #: 0 10*3/uL (ref 0.0–0.1)
Basophil %: 0.9 %
EOS ABS: 0.1 10*3/uL (ref 0.0–0.7)
EOS PCT: 4.2 %
HCT: 30.8 % — ABNORMAL LOW (ref 40.0–52.0)
HGB: 10.4 g/dL — ABNORMAL LOW (ref 13.0–18.0)
Lymphocyte #: 0.7 10*3/uL — ABNORMAL LOW (ref 1.0–3.6)
Lymphocyte %: 24.2 %
MCH: 28.1 pg (ref 26.0–34.0)
MCHC: 33.9 g/dL (ref 32.0–36.0)
MCV: 83 fL (ref 80–100)
MONOS PCT: 8.2 %
Monocyte #: 0.2 x10 3/mm (ref 0.2–1.0)
NEUTROS ABS: 1.7 10*3/uL (ref 1.4–6.5)
Neutrophil %: 62.5 %
Platelet: 143 10*3/uL — ABNORMAL LOW (ref 150–440)
RBC: 3.71 10*6/uL — ABNORMAL LOW (ref 4.40–5.90)
RDW: 15.1 % — AB (ref 11.5–14.5)
WBC: 2.7 10*3/uL — ABNORMAL LOW (ref 3.8–10.6)

## 2014-03-30 LAB — URINE CULTURE

## 2014-03-30 LAB — PROTIME-INR
INR: 1.3
Prothrombin Time: 16.3 secs — ABNORMAL HIGH (ref 11.5–14.7)

## 2014-03-30 LAB — APTT: Activated PTT: 95.6 secs — ABNORMAL HIGH (ref 23.6–35.9)

## 2014-03-31 LAB — URINALYSIS, COMPLETE
BILIRUBIN, UR: NEGATIVE
BILIRUBIN, UR: NEGATIVE
Bacteria: NONE SEEN
Bacteria: NONE SEEN
Bilirubin,UR: NEGATIVE
GLUCOSE, UR: NEGATIVE mg/dL (ref 0–75)
Glucose,UR: NEGATIVE mg/dL (ref 0–75)
Glucose,UR: NEGATIVE mg/dL (ref 0–75)
KETONE: NEGATIVE
Ketone: NEGATIVE
Ketone: NEGATIVE
LEUKOCYTE ESTERASE: NEGATIVE
Leukocyte Esterase: NEGATIVE
Leukocyte Esterase: NEGATIVE
NITRITE: NEGATIVE
NITRITE: NEGATIVE
Nitrite: NEGATIVE
PH: 5 (ref 4.5–8.0)
PROTEIN: NEGATIVE
Ph: 8 (ref 4.5–8.0)
Ph: 7 (ref 4.5–8.0)
Protein: NEGATIVE
RBC,UR: 174 /HPF (ref 0–5)
RBC,UR: 235 /HPF (ref 0–5)
RBC,UR: 176 /HPF (ref 0–5)
SPECIFIC GRAVITY: 1.01 (ref 1.003–1.030)
Specific Gravity: 1.008 (ref 1.003–1.030)
Specific Gravity: 1.009 (ref 1.003–1.030)
Squamous Epithelial: NONE SEEN
Squamous Epithelial: NONE SEEN
WBC UR: 2 /HPF (ref 0–5)

## 2014-03-31 LAB — COMPREHENSIVE METABOLIC PANEL
ALK PHOS: 77 U/L
ANION GAP: 17 — AB (ref 7–16)
Albumin: 2.8 g/dL — ABNORMAL LOW (ref 3.4–5.0)
BUN: 28 mg/dL — AB (ref 7–18)
Bilirubin,Total: 0.3 mg/dL (ref 0.2–1.0)
CHLORIDE: 106 mmol/L (ref 98–107)
Calcium, Total: 7.8 mg/dL — ABNORMAL LOW (ref 8.5–10.1)
Co2: 24 mmol/L (ref 21–32)
Creatinine: 2.24 mg/dL — ABNORMAL HIGH (ref 0.60–1.30)
EGFR (African American): 38 — ABNORMAL LOW
GFR CALC NON AF AMER: 32 — AB
Glucose: 87 mg/dL (ref 65–99)
OSMOLALITY: 297 (ref 275–301)
POTASSIUM: 4.1 mmol/L (ref 3.5–5.1)
SGOT(AST): 26 U/L (ref 15–37)
SGPT (ALT): 19 U/L (ref 12–78)
Sodium: 147 mmol/L — ABNORMAL HIGH (ref 136–145)
Total Protein: 6.7 g/dL (ref 6.4–8.2)

## 2014-03-31 LAB — PROTEIN / CREATININE RATIO, URINE
Creatinine, Urine: 95.4 mg/dL (ref 30.0–125.0)
PROTEIN/CREAT. RATIO: 535 mg/g{creat} — AB (ref 0–200)
Protein, Random Urine: 51 mg/dL — ABNORMAL HIGH (ref 0–12)

## 2014-03-31 LAB — HEMOGLOBIN
HGB: 9.3 g/dL — ABNORMAL LOW (ref 13.0–18.0)
HGB: 9.7 g/dL — ABNORMAL LOW (ref 13.0–18.0)

## 2014-03-31 LAB — PLATELET COUNT: PLATELETS: 131 10*3/uL — AB (ref 150–440)

## 2014-03-31 LAB — PROTIME-INR
INR: 1.2
PROTHROMBIN TIME: 15.3 s — AB (ref 11.5–14.7)

## 2014-03-31 LAB — APTT: Activated PTT: 30.9 secs (ref 23.6–35.9)

## 2014-04-01 LAB — CBC WITH DIFFERENTIAL/PLATELET
Basophil #: 0 10*3/uL (ref 0.0–0.1)
Basophil %: 1 %
EOS ABS: 0.1 10*3/uL (ref 0.0–0.7)
EOS PCT: 3.3 %
HCT: 27 % — ABNORMAL LOW (ref 40.0–52.0)
HGB: 9 g/dL — ABNORMAL LOW (ref 13.0–18.0)
LYMPHS ABS: 0.4 10*3/uL — AB (ref 1.0–3.6)
Lymphocyte %: 16.9 %
MCH: 27.9 pg (ref 26.0–34.0)
MCHC: 33.6 g/dL (ref 32.0–36.0)
MCV: 83 fL (ref 80–100)
MONOS PCT: 6.4 %
Monocyte #: 0.2 x10 3/mm (ref 0.2–1.0)
NEUTROS PCT: 72.4 %
Neutrophil #: 1.8 10*3/uL (ref 1.4–6.5)
Platelet: 136 10*3/uL — ABNORMAL LOW (ref 150–440)
RBC: 3.24 10*6/uL — ABNORMAL LOW (ref 4.40–5.90)
RDW: 15.3 % — ABNORMAL HIGH (ref 11.5–14.5)
WBC: 2.4 10*3/uL — ABNORMAL LOW (ref 3.8–10.6)

## 2014-04-01 LAB — BASIC METABOLIC PANEL
ANION GAP: 3 — AB (ref 7–16)
BUN: 25 mg/dL — ABNORMAL HIGH (ref 7–18)
Calcium, Total: 8.1 mg/dL — ABNORMAL LOW (ref 8.5–10.1)
Chloride: 105 mmol/L (ref 98–107)
Co2: 27 mmol/L (ref 21–32)
Creatinine: 2.33 mg/dL — ABNORMAL HIGH (ref 0.60–1.30)
EGFR (African American): 36 — ABNORMAL LOW
GFR CALC NON AF AMER: 31 — AB
Glucose: 88 mg/dL (ref 65–99)
OSMOLALITY: 274 (ref 275–301)
Potassium: 4.3 mmol/L (ref 3.5–5.1)
Sodium: 135 mmol/L — ABNORMAL LOW (ref 136–145)

## 2014-04-02 LAB — CULTURE, BLOOD (SINGLE)

## 2014-04-03 LAB — CBC CANCER CENTER
Basophil #: 0 x10 3/mm (ref 0.0–0.1)
Basophil %: 0.7 %
EOS ABS: 0.1 x10 3/mm (ref 0.0–0.7)
EOS PCT: 3 %
HCT: 30.6 % — ABNORMAL LOW (ref 40.0–52.0)
HGB: 10 g/dL — AB (ref 13.0–18.0)
LYMPHS ABS: 0.4 x10 3/mm — AB (ref 1.0–3.6)
LYMPHS PCT: 14.4 %
MCH: 27.4 pg (ref 26.0–34.0)
MCHC: 32.6 g/dL (ref 32.0–36.0)
MCV: 84 fL (ref 80–100)
Monocyte #: 0.2 x10 3/mm (ref 0.2–1.0)
Monocyte %: 6.8 %
NEUTROS ABS: 2.1 x10 3/mm (ref 1.4–6.5)
Neutrophil %: 75.1 %
PLATELETS: 159 x10 3/mm (ref 150–440)
RBC: 3.64 10*6/uL — ABNORMAL LOW (ref 4.40–5.90)
RDW: 15.2 % — ABNORMAL HIGH (ref 11.5–14.5)
WBC: 2.8 x10 3/mm — ABNORMAL LOW (ref 3.8–10.6)

## 2014-04-03 LAB — CREATININE, SERUM
Creatinine: 2.42 mg/dL — ABNORMAL HIGH (ref 0.60–1.30)
GFR CALC AF AMER: 34 — AB
GFR CALC NON AF AMER: 30 — AB

## 2014-04-03 LAB — PROTIME-INR
INR: 1.1
Prothrombin Time: 13.9 secs (ref 11.5–14.7)

## 2014-04-05 LAB — CBC CANCER CENTER
BASOS PCT: 0.6 %
Basophil #: 0 x10 3/mm (ref 0.0–0.1)
EOS PCT: 3 %
Eosinophil #: 0.1 x10 3/mm (ref 0.0–0.7)
HCT: 30 % — AB (ref 40.0–52.0)
HGB: 9.8 g/dL — ABNORMAL LOW (ref 13.0–18.0)
LYMPHS ABS: 0.6 x10 3/mm — AB (ref 1.0–3.6)
Lymphocyte %: 20.6 %
MCH: 27.1 pg (ref 26.0–34.0)
MCHC: 32.5 g/dL (ref 32.0–36.0)
MCV: 83 fL (ref 80–100)
MONOS PCT: 6.1 %
Monocyte #: 0.2 x10 3/mm (ref 0.2–1.0)
NEUTROS PCT: 69.7 %
Neutrophil #: 2.2 x10 3/mm (ref 1.4–6.5)
Platelet: 171 x10 3/mm (ref 150–440)
RBC: 3.6 10*6/uL — AB (ref 4.40–5.90)
RDW: 15.1 % — ABNORMAL HIGH (ref 11.5–14.5)
WBC: 3.1 x10 3/mm — ABNORMAL LOW (ref 3.8–10.6)

## 2014-04-05 LAB — BASIC METABOLIC PANEL
Anion Gap: 10 (ref 7–16)
BUN: 32 mg/dL — AB (ref 7–18)
CHLORIDE: 102 mmol/L (ref 98–107)
CREATININE: 2.4 mg/dL — AB (ref 0.60–1.30)
Calcium, Total: 8.7 mg/dL (ref 8.5–10.1)
Co2: 27 mmol/L (ref 21–32)
EGFR (African American): 35 — ABNORMAL LOW
EGFR (Non-African Amer.): 30 — ABNORMAL LOW
GLUCOSE: 80 mg/dL (ref 65–99)
Osmolality: 283 (ref 275–301)
Potassium: 4.2 mmol/L (ref 3.5–5.1)
Sodium: 139 mmol/L (ref 136–145)

## 2014-04-07 LAB — PROTIME-INR
INR: 2.1
Prothrombin Time: 22.9 secs — ABNORMAL HIGH (ref 11.5–14.7)

## 2014-04-10 LAB — CBC CANCER CENTER
BASOS ABS: 0 x10 3/mm (ref 0.0–0.1)
BASOS PCT: 0.1 %
EOS ABS: 0 x10 3/mm (ref 0.0–0.7)
Eosinophil %: 0.2 %
HCT: 31 % — ABNORMAL LOW (ref 40.0–52.0)
HGB: 10.1 g/dL — AB (ref 13.0–18.0)
Lymphocyte #: 0.3 x10 3/mm — ABNORMAL LOW (ref 1.0–3.6)
Lymphocyte %: 4.3 %
MCH: 27.4 pg (ref 26.0–34.0)
MCHC: 32.6 g/dL (ref 32.0–36.0)
MCV: 84 fL (ref 80–100)
MONO ABS: 0.3 x10 3/mm (ref 0.2–1.0)
Monocyte %: 3.7 %
Neutrophil #: 6.3 x10 3/mm (ref 1.4–6.5)
Neutrophil %: 91.7 %
PLATELETS: 148 x10 3/mm — AB (ref 150–440)
RBC: 3.68 10*6/uL — ABNORMAL LOW (ref 4.40–5.90)
RDW: 15.4 % — ABNORMAL HIGH (ref 11.5–14.5)
WBC: 6.8 x10 3/mm (ref 3.8–10.6)

## 2014-04-10 LAB — PROTIME-INR
INR: 3
Prothrombin Time: 30.4 secs — ABNORMAL HIGH (ref 11.5–14.7)

## 2014-04-13 ENCOUNTER — Ambulatory Visit: Payer: Self-pay | Admitting: Nephrology

## 2014-04-13 LAB — CBC CANCER CENTER
BASOS ABS: 0 x10 3/mm (ref 0.0–0.1)
Basophil %: 0.3 %
EOS ABS: 0.1 x10 3/mm (ref 0.0–0.7)
Eosinophil %: 1.4 %
HCT: 29 % — ABNORMAL LOW (ref 40.0–52.0)
HGB: 9.3 g/dL — AB (ref 13.0–18.0)
LYMPHS ABS: 0.3 x10 3/mm — AB (ref 1.0–3.6)
Lymphocyte %: 5.6 %
MCH: 27.1 pg (ref 26.0–34.0)
MCHC: 32.2 g/dL (ref 32.0–36.0)
MCV: 84 fL (ref 80–100)
MONOS PCT: 8.2 %
Monocyte #: 0.4 x10 3/mm (ref 0.2–1.0)
Neutrophil #: 3.8 x10 3/mm (ref 1.4–6.5)
Neutrophil %: 84.5 %
Platelet: 140 x10 3/mm — ABNORMAL LOW (ref 150–440)
RBC: 3.44 10*6/uL — AB (ref 4.40–5.90)
RDW: 15.7 % — AB (ref 11.5–14.5)
WBC: 4.5 x10 3/mm (ref 3.8–10.6)

## 2014-04-13 LAB — PROTIME-INR
INR: 3.1
Prothrombin Time: 31.3 secs — ABNORMAL HIGH (ref 11.5–14.7)

## 2014-04-13 LAB — CREATININE, SERUM
Creatinine: 2.59 mg/dL — ABNORMAL HIGH (ref 0.60–1.30)
EGFR (African American): 32 — ABNORMAL LOW
EGFR (Non-African Amer.): 27 — ABNORMAL LOW

## 2014-04-19 LAB — PROTIME-INR
INR: 6.1
PROTHROMBIN TIME: 52.3 s — AB (ref 11.5–14.7)

## 2014-04-19 LAB — CBC CANCER CENTER
Basophil #: 0 x10 3/mm (ref 0.0–0.1)
Basophil %: 0.5 %
EOS ABS: 0 x10 3/mm (ref 0.0–0.7)
EOS PCT: 0 %
HCT: 28.2 % — AB (ref 40.0–52.0)
HGB: 9.6 g/dL — AB (ref 13.0–18.0)
Lymphocyte #: 0.1 x10 3/mm — ABNORMAL LOW (ref 1.0–3.6)
Lymphocyte %: 2.8 %
MCH: 28.3 pg (ref 26.0–34.0)
MCHC: 33.9 g/dL (ref 32.0–36.0)
MCV: 83 fL (ref 80–100)
MONOS PCT: 2.3 %
Monocyte #: 0.1 x10 3/mm — ABNORMAL LOW (ref 0.2–1.0)
Neutrophil #: 4.4 x10 3/mm (ref 1.4–6.5)
Neutrophil %: 94.4 %
Platelet: 177 x10 3/mm (ref 150–440)
RBC: 3.38 10*6/uL — ABNORMAL LOW (ref 4.40–5.90)
RDW: 16.5 % — AB (ref 11.5–14.5)
WBC: 4.6 x10 3/mm (ref 3.8–10.6)

## 2014-04-19 LAB — CREATININE, SERUM
Creatinine: 3.51 mg/dL — ABNORMAL HIGH (ref 0.60–1.30)
EGFR (African American): 22 — ABNORMAL LOW
GFR CALC NON AF AMER: 19 — AB

## 2014-04-21 ENCOUNTER — Ambulatory Visit: Payer: Self-pay | Admitting: Internal Medicine

## 2014-04-21 LAB — CBC CANCER CENTER
BASOS ABS: 0 x10 3/mm (ref 0.0–0.1)
BASOS PCT: 0.2 %
EOS PCT: 0.5 %
Eosinophil #: 0 x10 3/mm (ref 0.0–0.7)
HCT: 29.3 % — AB (ref 40.0–52.0)
HGB: 9.9 g/dL — ABNORMAL LOW (ref 13.0–18.0)
Lymphocyte #: 0.2 x10 3/mm — ABNORMAL LOW (ref 1.0–3.6)
Lymphocyte %: 4.9 %
MCH: 28.4 pg (ref 26.0–34.0)
MCHC: 33.8 g/dL (ref 32.0–36.0)
MCV: 84 fL (ref 80–100)
MONO ABS: 0.6 x10 3/mm (ref 0.2–1.0)
MONOS PCT: 11.5 %
Neutrophil #: 4.1 x10 3/mm (ref 1.4–6.5)
Neutrophil %: 82.9 %
Platelet: 212 x10 3/mm (ref 150–440)
RBC: 3.5 10*6/uL — AB (ref 4.40–5.90)
RDW: 16.4 % — ABNORMAL HIGH (ref 11.5–14.5)
WBC: 4.9 x10 3/mm (ref 3.8–10.6)

## 2014-04-21 LAB — PROTIME-INR
INR: 3.8
Prothrombin Time: 36.1 secs — ABNORMAL HIGH (ref 11.5–14.7)

## 2014-04-25 LAB — CBC CANCER CENTER
BASOS ABS: 0 x10 3/mm (ref 0.0–0.1)
Basophil %: 0.2 %
EOS ABS: 0 x10 3/mm (ref 0.0–0.7)
EOS PCT: 0.7 %
HCT: 29 % — ABNORMAL LOW (ref 40.0–52.0)
HGB: 9.6 g/dL — ABNORMAL LOW (ref 13.0–18.0)
LYMPHS ABS: 0.5 x10 3/mm — AB (ref 1.0–3.6)
Lymphocyte %: 7.4 %
MCH: 28.4 pg (ref 26.0–34.0)
MCHC: 33.2 g/dL (ref 32.0–36.0)
MCV: 86 fL (ref 80–100)
MONOS PCT: 8.4 %
Monocyte #: 0.6 x10 3/mm (ref 0.2–1.0)
Neutrophil #: 5.6 x10 3/mm (ref 1.4–6.5)
Neutrophil %: 83.3 %
PLATELETS: 169 x10 3/mm (ref 150–440)
RBC: 3.39 10*6/uL — ABNORMAL LOW (ref 4.40–5.90)
RDW: 17.8 % — ABNORMAL HIGH (ref 11.5–14.5)
WBC: 6.7 x10 3/mm (ref 3.8–10.6)

## 2014-04-25 LAB — PROTIME-INR
INR: 2.1
PROTHROMBIN TIME: 22.9 s — AB (ref 11.5–14.7)

## 2014-04-27 LAB — CBC CANCER CENTER
BASOS ABS: 0 x10 3/mm (ref 0.0–0.1)
BASOS PCT: 0.2 %
EOS ABS: 0 x10 3/mm (ref 0.0–0.7)
Eosinophil %: 0.1 %
HCT: 27.9 % — AB (ref 40.0–52.0)
HGB: 9.4 g/dL — AB (ref 13.0–18.0)
LYMPHS PCT: 2.5 %
Lymphocyte #: 0.2 x10 3/mm — ABNORMAL LOW (ref 1.0–3.6)
MCH: 29 pg (ref 26.0–34.0)
MCHC: 33.7 g/dL (ref 32.0–36.0)
MCV: 86 fL (ref 80–100)
Monocyte #: 0.2 x10 3/mm (ref 0.2–1.0)
Monocyte %: 2.8 %
NEUTROS PCT: 94.4 %
Neutrophil #: 7.4 x10 3/mm — ABNORMAL HIGH (ref 1.4–6.5)
PLATELETS: 148 x10 3/mm — AB (ref 150–440)
RBC: 3.24 10*6/uL — ABNORMAL LOW (ref 4.40–5.90)
RDW: 18.3 % — ABNORMAL HIGH (ref 11.5–14.5)
WBC: 7.8 x10 3/mm (ref 3.8–10.6)

## 2014-04-27 LAB — CREATININE, SERUM
CREATININE: 3.31 mg/dL — AB (ref 0.60–1.30)
EGFR (African American): 23 — ABNORMAL LOW
EGFR (Non-African Amer.): 20 — ABNORMAL LOW

## 2014-04-27 LAB — PROTIME-INR
INR: 2.8
Prothrombin Time: 28.5 secs — ABNORMAL HIGH (ref 11.5–14.7)

## 2014-05-01 LAB — COMPREHENSIVE METABOLIC PANEL
ALBUMIN: 2.8 g/dL — AB (ref 3.4–5.0)
ALK PHOS: 65 U/L
ALT: 17 U/L (ref 12–78)
AST: 8 U/L — AB (ref 15–37)
Anion Gap: 10 (ref 7–16)
BUN: 65 mg/dL — ABNORMAL HIGH (ref 7–18)
Bilirubin,Total: 0.4 mg/dL (ref 0.2–1.0)
Calcium, Total: 8 mg/dL — ABNORMAL LOW (ref 8.5–10.1)
Chloride: 104 mmol/L (ref 98–107)
Co2: 23 mmol/L (ref 21–32)
Creatinine: 3.3 mg/dL — ABNORMAL HIGH (ref 0.60–1.30)
EGFR (Non-African Amer.): 20 — ABNORMAL LOW
GFR CALC AF AMER: 24 — AB
GLUCOSE: 156 mg/dL — AB (ref 65–99)
Osmolality: 296 (ref 275–301)
Potassium: 5.3 mmol/L — ABNORMAL HIGH (ref 3.5–5.1)
Sodium: 137 mmol/L (ref 136–145)
TOTAL PROTEIN: 6.8 g/dL (ref 6.4–8.2)

## 2014-05-01 LAB — CBC CANCER CENTER
BASOS ABS: 0 x10 3/mm (ref 0.0–0.1)
BASOS PCT: 0.2 %
EOS PCT: 0.1 %
Eosinophil #: 0 x10 3/mm (ref 0.0–0.7)
HCT: 27.2 % — ABNORMAL LOW (ref 40.0–52.0)
HGB: 9.4 g/dL — ABNORMAL LOW (ref 13.0–18.0)
LYMPHS PCT: 4.3 %
Lymphocyte #: 0.2 x10 3/mm — ABNORMAL LOW (ref 1.0–3.6)
MCH: 29.7 pg (ref 26.0–34.0)
MCHC: 34.6 g/dL (ref 32.0–36.0)
MCV: 86 fL (ref 80–100)
Monocyte #: 0.2 x10 3/mm (ref 0.2–1.0)
Monocyte %: 3.2 %
Neutrophil #: 4.9 x10 3/mm (ref 1.4–6.5)
Neutrophil %: 92.2 %
Platelet: 147 x10 3/mm — ABNORMAL LOW (ref 150–440)
RBC: 3.16 10*6/uL — ABNORMAL LOW (ref 4.40–5.90)
RDW: 18.2 % — ABNORMAL HIGH (ref 11.5–14.5)
WBC: 5.3 x10 3/mm (ref 3.8–10.6)

## 2014-05-01 LAB — PROTIME-INR
INR: 1.8
PROTHROMBIN TIME: 20.8 s — AB (ref 11.5–14.7)

## 2014-05-01 LAB — MAGNESIUM: MAGNESIUM: 1.5 mg/dL — AB

## 2014-05-04 LAB — MAGNESIUM: MAGNESIUM: 1.6 mg/dL — AB

## 2014-05-04 LAB — POTASSIUM: Potassium: 5.9 mmol/L — ABNORMAL HIGH (ref 3.5–5.1)

## 2014-05-04 LAB — CREATININE, SERUM
CREATININE: 2.79 mg/dL — AB (ref 0.60–1.30)
GFR CALC AF AMER: 29 — AB
GFR CALC NON AF AMER: 25 — AB

## 2014-05-04 LAB — PROTIME-INR
INR: 2
Prothrombin Time: 21.9 secs — ABNORMAL HIGH (ref 11.5–14.7)

## 2014-05-08 ENCOUNTER — Ambulatory Visit: Payer: Self-pay | Admitting: Nephrology

## 2014-05-08 LAB — CBC CANCER CENTER
BASOS ABS: 0 x10 3/mm (ref 0.0–0.1)
Basophil %: 0.3 %
EOS PCT: 0.2 %
Eosinophil #: 0 x10 3/mm (ref 0.0–0.7)
HCT: 29.1 % — AB (ref 40.0–52.0)
HGB: 9.9 g/dL — ABNORMAL LOW (ref 13.0–18.0)
LYMPHS ABS: 0.3 x10 3/mm — AB (ref 1.0–3.6)
Lymphocyte %: 4.4 %
MCH: 29.5 pg (ref 26.0–34.0)
MCHC: 34.2 g/dL (ref 32.0–36.0)
MCV: 86 fL (ref 80–100)
MONOS PCT: 3.2 %
Monocyte #: 0.2 x10 3/mm (ref 0.2–1.0)
Neutrophil #: 7.2 x10 3/mm — ABNORMAL HIGH (ref 1.4–6.5)
Neutrophil %: 91.9 %
PLATELETS: 183 x10 3/mm (ref 150–440)
RBC: 3.37 10*6/uL — ABNORMAL LOW (ref 4.40–5.90)
RDW: 18.4 % — ABNORMAL HIGH (ref 11.5–14.5)
WBC: 7.9 x10 3/mm (ref 3.8–10.6)

## 2014-05-08 LAB — PROTIME-INR
INR: 2.6
Prothrombin Time: 26.8 secs — ABNORMAL HIGH (ref 11.5–14.7)

## 2014-05-08 LAB — POTASSIUM: Potassium: 4.5 mmol/L (ref 3.5–5.1)

## 2014-05-12 LAB — PROTIME-INR
INR: 2.7
PROTHROMBIN TIME: 28 s — AB (ref 11.5–14.7)

## 2014-05-19 LAB — PROTIME-INR
INR: 1.5
Prothrombin Time: 18.2 secs — ABNORMAL HIGH (ref 11.5–14.7)

## 2014-05-22 ENCOUNTER — Ambulatory Visit: Payer: Self-pay | Admitting: Internal Medicine

## 2014-05-25 LAB — PROTIME-INR
INR: 2.4
Prothrombin Time: 25.9 secs — ABNORMAL HIGH (ref 11.5–14.7)

## 2014-06-12 LAB — PROTIME-INR
INR: 1.4
PROTHROMBIN TIME: 17 s — AB (ref 11.5–14.7)

## 2014-06-19 LAB — PROTIME-INR
INR: 1.3
PROTHROMBIN TIME: 16.4 s — AB (ref 11.5–14.7)

## 2014-06-21 ENCOUNTER — Ambulatory Visit: Payer: Self-pay | Admitting: Internal Medicine

## 2014-06-22 LAB — PROTIME-INR
INR: 1.5
PROTHROMBIN TIME: 17.5 s — AB (ref 11.5–14.7)

## 2014-06-26 LAB — PROTIME-INR
INR: 1.7
Prothrombin Time: 19.4 secs — ABNORMAL HIGH (ref 11.5–14.7)

## 2014-06-27 DIAGNOSIS — R0602 Shortness of breath: Secondary | ICD-10-CM | POA: Insufficient documentation

## 2014-06-27 DIAGNOSIS — J449 Chronic obstructive pulmonary disease, unspecified: Secondary | ICD-10-CM | POA: Insufficient documentation

## 2014-06-27 DIAGNOSIS — R0681 Apnea, not elsewhere classified: Secondary | ICD-10-CM | POA: Insufficient documentation

## 2014-07-02 DIAGNOSIS — R1012 Left upper quadrant pain: Secondary | ICD-10-CM | POA: Insufficient documentation

## 2014-07-06 ENCOUNTER — Ambulatory Visit: Payer: Self-pay | Admitting: Internal Medicine

## 2014-07-07 LAB — CBC CANCER CENTER
BASOS PCT: 0.6 %
Basophil #: 0 x10 3/mm (ref 0.0–0.1)
EOS ABS: 0 x10 3/mm (ref 0.0–0.7)
EOS PCT: 0.2 %
HCT: 23.5 % — AB (ref 40.0–52.0)
HGB: 7.9 g/dL — AB (ref 13.0–18.0)
Lymphocyte #: 0.3 x10 3/mm — ABNORMAL LOW (ref 1.0–3.6)
Lymphocyte %: 5.5 %
MCH: 29.9 pg (ref 26.0–34.0)
MCHC: 33.6 g/dL (ref 32.0–36.0)
MCV: 89 fL (ref 80–100)
MONOS PCT: 5.3 %
Monocyte #: 0.3 x10 3/mm (ref 0.2–1.0)
Neutrophil #: 5.5 x10 3/mm (ref 1.4–6.5)
Neutrophil %: 88.4 %
Platelet: 228 x10 3/mm (ref 150–440)
RBC: 2.64 10*6/uL — ABNORMAL LOW (ref 4.40–5.90)
RDW: 16.3 % — ABNORMAL HIGH (ref 11.5–14.5)
WBC: 6.2 x10 3/mm (ref 3.8–10.6)

## 2014-07-07 LAB — RETICULOCYTES
Absolute Retic Count: 0.0829 10*6/uL (ref 0.019–0.186)
RETICULOCYTE: 3.14 % — AB (ref 0.4–3.1)

## 2014-07-07 LAB — LACTATE DEHYDROGENASE: LDH: 180 U/L (ref 85–241)

## 2014-07-07 LAB — PROTIME-INR
INR: 1.3
Prothrombin Time: 15.5 secs — ABNORMAL HIGH (ref 11.5–14.7)

## 2014-07-11 LAB — IRON AND TIBC
IRON: 99 ug/dL (ref 65–175)
Iron Bind.Cap.(Total): 277 ug/dL (ref 250–450)
Iron Saturation: 36 %
Unbound Iron-Bind.Cap.: 178 ug/dL

## 2014-07-11 LAB — PROTIME-INR
INR: 2.3
Prothrombin Time: 24.6 secs — ABNORMAL HIGH (ref 11.5–14.7)

## 2014-07-11 LAB — FERRITIN: Ferritin (ARMC): 520 ng/mL — ABNORMAL HIGH (ref 8–388)

## 2014-07-11 LAB — CANCER CENTER HEMOGLOBIN: HGB: 7.9 g/dL — AB (ref 13.0–18.0)

## 2014-07-17 LAB — PROTIME-INR
INR: 2.8
Prothrombin Time: 28.8 secs — ABNORMAL HIGH (ref 11.5–14.7)

## 2014-07-21 LAB — PROTIME-INR
INR: 2.5
Prothrombin Time: 26.6 secs — ABNORMAL HIGH (ref 11.5–14.7)

## 2014-07-22 ENCOUNTER — Ambulatory Visit: Payer: Self-pay | Admitting: Internal Medicine

## 2014-07-27 LAB — PROTIME-INR
INR: 2.9
Prothrombin Time: 29.5 secs — ABNORMAL HIGH (ref 11.5–14.7)

## 2014-08-07 ENCOUNTER — Emergency Department: Payer: Self-pay | Admitting: Emergency Medicine

## 2014-08-07 LAB — URINALYSIS, COMPLETE
Bilirubin,UR: NEGATIVE
GLUCOSE, UR: NEGATIVE mg/dL (ref 0–75)
Hyaline Cast: 14
Ketone: NEGATIVE
Leukocyte Esterase: NEGATIVE
NITRITE: NEGATIVE
Ph: 5 (ref 4.5–8.0)
Protein: NEGATIVE
Specific Gravity: 1.011 (ref 1.003–1.030)
WBC UR: 3 /HPF (ref 0–5)

## 2014-08-07 LAB — COMPREHENSIVE METABOLIC PANEL
Albumin: 4.1 g/dL (ref 3.4–5.0)
Alkaline Phosphatase: 75 U/L
Anion Gap: 12 (ref 7–16)
BUN: 37 mg/dL — ABNORMAL HIGH (ref 7–18)
Bilirubin,Total: 0.4 mg/dL (ref 0.2–1.0)
CREATININE: 2.72 mg/dL — AB (ref 0.60–1.30)
Calcium, Total: 8.9 mg/dL (ref 8.5–10.1)
Chloride: 104 mmol/L (ref 98–107)
Co2: 20 mmol/L — ABNORMAL LOW (ref 21–32)
EGFR (Non-African Amer.): 26 — ABNORMAL LOW
GFR CALC AF AMER: 30 — AB
Glucose: 111 mg/dL — ABNORMAL HIGH (ref 65–99)
Osmolality: 281 (ref 275–301)
Potassium: 4.3 mmol/L (ref 3.5–5.1)
SGOT(AST): 10 U/L — ABNORMAL LOW (ref 15–37)
SGPT (ALT): 16 U/L
Sodium: 136 mmol/L (ref 136–145)
Total Protein: 7.8 g/dL (ref 6.4–8.2)

## 2014-08-07 LAB — CBC WITH DIFFERENTIAL/PLATELET
Basophil #: 0 10*3/uL (ref 0.0–0.1)
Basophil %: 0.6 %
EOS ABS: 0 10*3/uL (ref 0.0–0.7)
Eosinophil %: 0.1 %
HCT: 24.3 % — AB (ref 40.0–52.0)
HGB: 7.8 g/dL — AB (ref 13.0–18.0)
LYMPHS ABS: 0.5 10*3/uL — AB (ref 1.0–3.6)
Lymphocyte %: 7.9 %
MCH: 29.6 pg (ref 26.0–34.0)
MCHC: 32.1 g/dL (ref 32.0–36.0)
MCV: 92 fL (ref 80–100)
Monocyte #: 0.5 x10 3/mm (ref 0.2–1.0)
Monocyte %: 8.3 %
NEUTROS ABS: 4.9 10*3/uL (ref 1.4–6.5)
Neutrophil %: 83.1 %
PLATELETS: 227 10*3/uL (ref 150–440)
RBC: 2.64 10*6/uL — AB (ref 4.40–5.90)
RDW: 14.5 % (ref 11.5–14.5)
WBC: 6 10*3/uL (ref 3.8–10.6)

## 2014-08-07 LAB — LIPASE, BLOOD: Lipase: 252 U/L (ref 73–393)

## 2014-08-07 LAB — PROTIME-INR
INR: 2.1
PROTHROMBIN TIME: 23.4 s — AB (ref 11.5–14.7)

## 2014-08-09 LAB — PROTIME-INR
INR: 2
Prothrombin Time: 22.3 secs — ABNORMAL HIGH (ref 11.5–14.7)

## 2014-08-09 LAB — CBC CANCER CENTER
Basophil #: 0 x10 3/mm (ref 0.0–0.1)
Basophil %: 0.6 %
EOS ABS: 0.1 x10 3/mm (ref 0.0–0.7)
Eosinophil %: 1.4 %
HCT: 23.7 % — ABNORMAL LOW (ref 40.0–52.0)
HGB: 7.7 g/dL — AB (ref 13.0–18.0)
Lymphocyte #: 0.7 x10 3/mm — ABNORMAL LOW (ref 1.0–3.6)
Lymphocyte %: 16.5 %
MCH: 30 pg (ref 26.0–34.0)
MCHC: 32.4 g/dL (ref 32.0–36.0)
MCV: 92 fL (ref 80–100)
Monocyte #: 0.5 x10 3/mm (ref 0.2–1.0)
Monocyte %: 12.5 %
NEUTROS ABS: 2.8 x10 3/mm (ref 1.4–6.5)
NEUTROS PCT: 69 %
Platelet: 207 x10 3/mm (ref 150–440)
RBC: 2.56 10*6/uL — ABNORMAL LOW (ref 4.40–5.90)
RDW: 14.8 % — AB (ref 11.5–14.5)
WBC: 4 x10 3/mm (ref 3.8–10.6)

## 2014-08-20 DIAGNOSIS — R1084 Generalized abdominal pain: Secondary | ICD-10-CM | POA: Insufficient documentation

## 2014-08-20 DIAGNOSIS — R110 Nausea: Secondary | ICD-10-CM | POA: Insufficient documentation

## 2014-08-22 ENCOUNTER — Ambulatory Visit: Payer: Self-pay | Admitting: Internal Medicine

## 2014-08-22 LAB — CBC CANCER CENTER
Basophil #: 0 x10 3/mm (ref 0.0–0.1)
Basophil %: 0.8 %
Eosinophil #: 0.1 x10 3/mm (ref 0.0–0.7)
Eosinophil %: 2.2 %
HCT: 23 % — AB (ref 40.0–52.0)
HGB: 7.5 g/dL — ABNORMAL LOW (ref 13.0–18.0)
LYMPHS ABS: 0.5 x10 3/mm — AB (ref 1.0–3.6)
Lymphocyte %: 17.7 %
MCH: 29.7 pg (ref 26.0–34.0)
MCHC: 32.4 g/dL (ref 32.0–36.0)
MCV: 92 fL (ref 80–100)
Monocyte #: 0.4 x10 3/mm (ref 0.2–1.0)
Monocyte %: 13.9 %
NEUTROS ABS: 1.9 x10 3/mm (ref 1.4–6.5)
NEUTROS PCT: 65.4 %
Platelet: 185 x10 3/mm (ref 150–440)
RBC: 2.51 10*6/uL — AB (ref 4.40–5.90)
RDW: 14.5 % (ref 11.5–14.5)
WBC: 2.8 x10 3/mm — ABNORMAL LOW (ref 3.8–10.6)

## 2014-08-22 LAB — PROTIME-INR
INR: 2.5
Prothrombin Time: 26.3 secs — ABNORMAL HIGH (ref 11.5–14.7)

## 2014-08-29 LAB — CANCER CENTER HEMOGLOBIN: HGB: 8.1 g/dL — ABNORMAL LOW (ref 13.0–18.0)

## 2014-08-29 LAB — PROTIME-INR
INR: 2
Prothrombin Time: 22.1 secs — ABNORMAL HIGH (ref 11.5–14.7)

## 2014-09-05 LAB — CBC CANCER CENTER
BASOS ABS: 0 x10 3/mm (ref 0.0–0.1)
BASOS PCT: 0.5 %
EOS PCT: 0.1 %
Eosinophil #: 0 x10 3/mm (ref 0.0–0.7)
HCT: 24.4 % — ABNORMAL LOW (ref 40.0–52.0)
HGB: 7.6 g/dL — AB (ref 13.0–18.0)
Lymphocyte #: 0.3 x10 3/mm — ABNORMAL LOW (ref 1.0–3.6)
Lymphocyte %: 7.6 %
MCH: 28.9 pg (ref 26.0–34.0)
MCHC: 31.1 g/dL — ABNORMAL LOW (ref 32.0–36.0)
MCV: 93 fL (ref 80–100)
MONO ABS: 0.3 x10 3/mm (ref 0.2–1.0)
Monocyte %: 6.9 %
NEUTROS PCT: 84.9 %
Neutrophil #: 3.2 x10 3/mm (ref 1.4–6.5)
Platelet: 206 x10 3/mm (ref 150–440)
RBC: 2.62 10*6/uL — AB (ref 4.40–5.90)
RDW: 14.7 % — AB (ref 11.5–14.5)
WBC: 3.8 x10 3/mm (ref 3.8–10.6)

## 2014-09-05 LAB — PROTIME-INR
INR: 2
PROTHROMBIN TIME: 22.1 s — AB (ref 11.5–14.7)

## 2014-09-12 LAB — PROTIME-INR
INR: 2.2
Prothrombin Time: 24.2 secs — ABNORMAL HIGH (ref 11.5–14.7)

## 2014-09-12 LAB — CANCER CENTER HEMOGLOBIN: HGB: 8.8 g/dL — AB (ref 13.0–18.0)

## 2014-09-21 ENCOUNTER — Ambulatory Visit: Payer: Self-pay | Admitting: Internal Medicine

## 2014-09-21 LAB — CBC CANCER CENTER
BASOS ABS: 0 x10 3/mm (ref 0.0–0.1)
Basophil %: 0.8 %
EOS ABS: 0 x10 3/mm (ref 0.0–0.7)
Eosinophil %: 0.2 %
HCT: 27.6 % — AB (ref 40.0–52.0)
HGB: 8.8 g/dL — ABNORMAL LOW (ref 13.0–18.0)
Lymphocyte #: 0.2 x10 3/mm — ABNORMAL LOW (ref 1.0–3.6)
Lymphocyte %: 4 %
MCH: 29.6 pg (ref 26.0–34.0)
MCHC: 32 g/dL (ref 32.0–36.0)
MCV: 93 fL (ref 80–100)
Monocyte #: 0.2 x10 3/mm (ref 0.2–1.0)
Monocyte %: 4.3 %
NEUTROS ABS: 4.9 x10 3/mm (ref 1.4–6.5)
Neutrophil %: 90.7 %
PLATELETS: 211 x10 3/mm (ref 150–440)
RBC: 2.99 10*6/uL — ABNORMAL LOW (ref 4.40–5.90)
RDW: 14.5 % (ref 11.5–14.5)
WBC: 5.4 x10 3/mm (ref 3.8–10.6)

## 2014-09-21 LAB — PROTIME-INR
INR: 2.1
PROTHROMBIN TIME: 23.2 s — AB (ref 11.5–14.7)

## 2014-09-28 ENCOUNTER — Other Ambulatory Visit: Payer: Self-pay | Admitting: Orthopedic Surgery

## 2014-09-28 DIAGNOSIS — S82401K Unspecified fracture of shaft of right fibula, subsequent encounter for closed fracture with nonunion: Secondary | ICD-10-CM

## 2014-10-05 ENCOUNTER — Ambulatory Visit
Admission: RE | Admit: 2014-10-05 | Discharge: 2014-10-05 | Disposition: A | Discharge status: Home / Self Care | Payer: 59 | Source: Ambulatory Visit | Attending: Orthopedic Surgery | Admitting: Orthopedic Surgery

## 2014-10-05 DIAGNOSIS — S82401K Unspecified fracture of shaft of right fibula, subsequent encounter for closed fracture with nonunion: Secondary | ICD-10-CM

## 2014-10-05 LAB — COMPREHENSIVE METABOLIC PANEL
AST: 13 U/L — AB (ref 15–37)
Albumin: 3.7 g/dL (ref 3.4–5.0)
Alkaline Phosphatase: 74 U/L
Anion Gap: 9 (ref 7–16)
BUN: 34 mg/dL — AB (ref 7–18)
Bilirubin,Total: 0.2 mg/dL (ref 0.2–1.0)
CALCIUM: 8.2 mg/dL — AB (ref 8.5–10.1)
CHLORIDE: 111 mmol/L — AB (ref 98–107)
CO2: 22 mmol/L (ref 21–32)
Creatinine: 2.28 mg/dL — ABNORMAL HIGH (ref 0.60–1.30)
EGFR (African American): 39 — ABNORMAL LOW
EGFR (Non-African Amer.): 32 — ABNORMAL LOW
Glucose: 106 mg/dL — ABNORMAL HIGH (ref 65–99)
OSMOLALITY: 291 (ref 275–301)
POTASSIUM: 4.8 mmol/L (ref 3.5–5.1)
SGPT (ALT): 22 U/L
SODIUM: 142 mmol/L (ref 136–145)
TOTAL PROTEIN: 7 g/dL (ref 6.4–8.2)

## 2014-10-05 LAB — CANCER CENTER HEMOGLOBIN: HGB: 9.4 g/dL — AB (ref 13.0–18.0)

## 2014-10-05 LAB — PHOSPHORUS: Phosphorus: 2.4 mg/dL — ABNORMAL LOW (ref 2.5–4.9)

## 2014-10-05 LAB — MAGNESIUM: MAGNESIUM: 1.6 mg/dL — AB

## 2014-10-22 ENCOUNTER — Ambulatory Visit: Payer: Self-pay | Admitting: Internal Medicine

## 2014-12-04 ENCOUNTER — Ambulatory Visit: Payer: Self-pay | Admitting: Internal Medicine

## 2014-12-05 LAB — CBC CANCER CENTER
Basophil #: 0.1 x10 3/mm (ref 0.0–0.1)
Basophil %: 1.3 %
EOS ABS: 0 x10 3/mm (ref 0.0–0.7)
Eosinophil %: 0.2 %
HCT: 29.2 % — ABNORMAL LOW (ref 40.0–52.0)
HGB: 9.4 g/dL — ABNORMAL LOW (ref 13.0–18.0)
LYMPHS PCT: 5.1 %
Lymphocyte #: 0.3 x10 3/mm — ABNORMAL LOW (ref 1.0–3.6)
MCH: 28.7 pg (ref 26.0–34.0)
MCHC: 32.2 g/dL (ref 32.0–36.0)
MCV: 89 fL (ref 80–100)
MONOS PCT: 5.5 %
Monocyte #: 0.3 x10 3/mm (ref 0.2–1.0)
NEUTROS ABS: 5.1 x10 3/mm (ref 1.4–6.5)
NEUTROS PCT: 87.9 %
Platelet: 202 x10 3/mm (ref 150–440)
RBC: 3.27 10*6/uL — ABNORMAL LOW (ref 4.40–5.90)
RDW: 14.4 % (ref 11.5–14.5)
WBC: 5.8 x10 3/mm (ref 3.8–10.6)

## 2014-12-05 LAB — PROTIME-INR
INR: 1.1
PROTHROMBIN TIME: 14.5 s (ref 11.5–14.7)

## 2014-12-22 ENCOUNTER — Ambulatory Visit: Payer: Self-pay | Admitting: Internal Medicine

## 2015-01-04 LAB — PROTIME-INR
INR: 1.3
PROTHROMBIN TIME: 16.4 s — AB (ref 11.5–14.7)

## 2015-01-10 LAB — CBC CANCER CENTER
BASOS ABS: 0.1 x10 3/mm (ref 0.0–0.1)
Basophil %: 1.5 %
EOS PCT: 1.1 %
Eosinophil #: 0.1 x10 3/mm (ref 0.0–0.7)
HCT: 32.6 % — AB (ref 40.0–52.0)
HGB: 10.5 g/dL — ABNORMAL LOW (ref 13.0–18.0)
Lymphocyte #: 0.5 x10 3/mm — ABNORMAL LOW (ref 1.0–3.6)
Lymphocyte %: 9.3 %
MCH: 28.4 pg (ref 26.0–34.0)
MCHC: 32.3 g/dL (ref 32.0–36.0)
MCV: 88 fL (ref 80–100)
Monocyte #: 0.3 x10 3/mm (ref 0.2–1.0)
Monocyte %: 6.5 %
Neutrophil #: 4.2 x10 3/mm (ref 1.4–6.5)
Neutrophil %: 81.6 %
PLATELETS: 212 x10 3/mm (ref 150–440)
RBC: 3.71 10*6/uL — ABNORMAL LOW (ref 4.40–5.90)
RDW: 14.3 % (ref 11.5–14.5)
WBC: 5.2 x10 3/mm (ref 3.8–10.6)

## 2015-01-10 LAB — PROTIME-INR
INR: 1.5
PROTHROMBIN TIME: 17.9 s — AB (ref 11.5–14.7)

## 2015-01-15 LAB — PROTIME-INR
INR: 2.9
Prothrombin Time: 29.4 secs — ABNORMAL HIGH (ref 11.5–14.7)

## 2015-01-22 ENCOUNTER — Ambulatory Visit: Payer: Self-pay | Admitting: Internal Medicine

## 2015-01-23 LAB — PROTIME-INR
INR: 3.2
PROTHROMBIN TIME: 33 s — AB

## 2015-02-20 ENCOUNTER — Ambulatory Visit
Admit: 2015-02-20 | Disposition: A | Discharge status: Home / Self Care | Payer: Self-pay | Attending: Internal Medicine | Admitting: Internal Medicine

## 2015-03-05 DIAGNOSIS — B027 Disseminated zoster: Secondary | ICD-10-CM | POA: Insufficient documentation

## 2015-03-16 ENCOUNTER — Emergency Department (HOSPITAL_COMMUNITY): Payer: 59

## 2015-03-16 ENCOUNTER — Encounter (HOSPITAL_COMMUNITY): Payer: Self-pay | Admitting: *Deleted

## 2015-03-16 ENCOUNTER — Emergency Department (HOSPITAL_COMMUNITY)
Admission: EM | Admit: 2015-03-16 | Discharge: 2015-03-17 | Disposition: A | Discharge status: Home / Self Care | Payer: 59 | Attending: Emergency Medicine | Admitting: Emergency Medicine

## 2015-03-16 DIAGNOSIS — R21 Rash and other nonspecific skin eruption: Secondary | ICD-10-CM | POA: Diagnosis not present

## 2015-03-16 DIAGNOSIS — H539 Unspecified visual disturbance: Secondary | ICD-10-CM | POA: Diagnosis not present

## 2015-03-16 DIAGNOSIS — Z7901 Long term (current) use of anticoagulants: Secondary | ICD-10-CM | POA: Insufficient documentation

## 2015-03-16 DIAGNOSIS — R109 Unspecified abdominal pain: Secondary | ICD-10-CM | POA: Diagnosis present

## 2015-03-16 DIAGNOSIS — R6883 Chills (without fever): Secondary | ICD-10-CM | POA: Diagnosis not present

## 2015-03-16 DIAGNOSIS — Z8639 Personal history of other endocrine, nutritional and metabolic disease: Secondary | ICD-10-CM | POA: Insufficient documentation

## 2015-03-16 DIAGNOSIS — I10 Essential (primary) hypertension: Secondary | ICD-10-CM | POA: Insufficient documentation

## 2015-03-16 DIAGNOSIS — Z86718 Personal history of other venous thrombosis and embolism: Secondary | ICD-10-CM | POA: Insufficient documentation

## 2015-03-16 DIAGNOSIS — R112 Nausea with vomiting, unspecified: Secondary | ICD-10-CM | POA: Insufficient documentation

## 2015-03-16 DIAGNOSIS — R42 Dizziness and giddiness: Secondary | ICD-10-CM | POA: Diagnosis not present

## 2015-03-16 DIAGNOSIS — Z86711 Personal history of pulmonary embolism: Secondary | ICD-10-CM | POA: Insufficient documentation

## 2015-03-16 DIAGNOSIS — Z79899 Other long term (current) drug therapy: Secondary | ICD-10-CM | POA: Insufficient documentation

## 2015-03-16 DIAGNOSIS — R197 Diarrhea, unspecified: Secondary | ICD-10-CM | POA: Diagnosis not present

## 2015-03-16 DIAGNOSIS — R0602 Shortness of breath: Secondary | ICD-10-CM

## 2015-03-16 DIAGNOSIS — R1110 Vomiting, unspecified: Secondary | ICD-10-CM

## 2015-03-16 LAB — CBC WITH DIFFERENTIAL/PLATELET
BASOS PCT: 0 % (ref 0–1)
Basophils Absolute: 0 10*3/uL (ref 0.0–0.1)
EOS PCT: 1 % (ref 0–5)
Eosinophils Absolute: 0.1 10*3/uL (ref 0.0–0.7)
HEMATOCRIT: 32.6 % — AB (ref 39.0–52.0)
HEMOGLOBIN: 10.5 g/dL — AB (ref 13.0–17.0)
LYMPHS PCT: 17 % (ref 12–46)
Lymphs Abs: 1.2 10*3/uL (ref 0.7–4.0)
MCH: 29.4 pg (ref 26.0–34.0)
MCHC: 32.2 g/dL (ref 30.0–36.0)
MCV: 91.3 fL (ref 78.0–100.0)
MONO ABS: 0.9 10*3/uL (ref 0.1–1.0)
Monocytes Relative: 13 % — ABNORMAL HIGH (ref 3–12)
Neutro Abs: 5 10*3/uL (ref 1.7–7.7)
Neutrophils Relative %: 69 % (ref 43–77)
Platelets: 228 10*3/uL (ref 150–400)
RBC: 3.57 MIL/uL — ABNORMAL LOW (ref 4.22–5.81)
RDW: 16.8 % — ABNORMAL HIGH (ref 11.5–15.5)
WBC: 7.2 10*3/uL (ref 4.0–10.5)

## 2015-03-16 LAB — COMPREHENSIVE METABOLIC PANEL
ALT: 22 U/L (ref 0–53)
AST: 19 U/L (ref 0–37)
Albumin: 4.4 g/dL (ref 3.5–5.2)
Alkaline Phosphatase: 73 U/L (ref 39–117)
Anion gap: 11 (ref 5–15)
BUN: 45 mg/dL — ABNORMAL HIGH (ref 6–23)
CALCIUM: 9.4 mg/dL (ref 8.4–10.5)
CO2: 19 mmol/L (ref 19–32)
CREATININE: 3.65 mg/dL — AB (ref 0.50–1.35)
Chloride: 105 mmol/L (ref 96–112)
GFR calc non Af Amer: 18 mL/min — ABNORMAL LOW (ref >90)
GFR, EST AFRICAN AMERICAN: 20 mL/min — AB (ref >90)
GLUCOSE: 86 mg/dL (ref 70–99)
Potassium: 4.4 mmol/L (ref 3.5–5.1)
Sodium: 135 mmol/L (ref 135–145)
Total Bilirubin: 0.7 mg/dL (ref 0.3–1.2)
Total Protein: 7.8 g/dL (ref 6.0–8.3)

## 2015-03-16 LAB — TROPONIN I: Troponin I: <0.03 ng/mL (ref <0.031)

## 2015-03-16 LAB — LIPASE, BLOOD: Lipase: 41 U/L (ref 11–59)

## 2015-03-16 MED ORDER — ONDANSETRON HCL 4 MG/2ML IJ SOLN
4.0000 mg | Freq: Once | INTRAMUSCULAR | Status: AC
  Administered 2015-03-17: 4 mg via INTRAVENOUS
  Filled 2015-03-16: qty 2

## 2015-03-16 MED ORDER — SODIUM CHLORIDE 0.9 % IV BOLUS (SEPSIS)
1000.0000 mL | Freq: Once | INTRAVENOUS | Status: AC
Start: 2015-03-16 — End: 2015-03-17
  Administered 2015-03-17: 1000 mL via INTRAVENOUS

## 2015-03-16 NOTE — ED Notes (Signed)
The pt was at work today and started having some and cramps  With nv and diarrhea.  He felt faint. He is hyperventilating at present

## 2015-03-16 NOTE — ED Provider Notes (Signed)
CSN: [id number]     Arrival date & time 03/16/15  2028 History  This chart was scribed for Julianne Rice, MD by Eustaquio Maize, ED Scribe. This patient was seen in room B18C/B18C and the patient's care was started at 11:40 PM.    Chief Complaint  Patient presents with  . Abdominal Pain   The history is provided by the patient. No language interpreter was used.     HPI Comments: Jonathon Snow is a 54 y.o. male with PMHx Kidney disease, HTN, and HLD who presents to the Emergency Department complaining of lightheaded dizziness that began suddenly around 5 PM tonight while at work. Pt also complains of blurred vision and chills. He states that he drank water, which mildly relieved symptoms put that they came back. He reports that his coworkers told him that he had a syncopal episode but that he doesn't remember it. Pt mentions that after these symptoms began he started vomiting and having diarrhea. Pt vomited 3 times and had diarrhea 4 times today. He also mentions feeling short of breath but that it has been a chronic issue. Pt has seen PCP for his shortness of breath and has albuterol inhaler which he claims does not work. Pt denies hematemesis, hematochezia, chest pain, urinary issues, or any other symptoms. Pt is currently on Warfarin of PE.    Past Medical History  Diagnosis Date  . Hypertension   . Pulmonary embolism   . DVT (deep venous thrombosis)   . Hyperlipidemia    Past Surgical History  Procedure Laterality Date  . Cyst excision  92 or 93     Liver cyst removal [facility]   No family history on file. History  Substance Use Topics  . Smoking status: Never Smoker   . Smokeless tobacco: Not on file  . Alcohol Use: No    Review of Systems  Constitutional: Positive for chills. Negative for fever.  Eyes: Positive for visual disturbance (Blurred vision. ).  Respiratory: Positive for shortness of breath. Negative for cough.   Cardiovascular: Negative for chest pain.   Gastrointestinal: Positive for nausea, vomiting and diarrhea. Negative for abdominal pain, constipation, blood in stool and anal bleeding.  Genitourinary: Negative for dysuria, hematuria, enuresis and difficulty urinating.  Skin: Negative for rash and wound.  Neurological: Positive for dizziness and light-headedness. Negative for syncope, weakness, numbness and headaches.  All other systems reviewed and are negative.     Allergies  Vicodin; Hydrocodone; and Oxycodone  Home Medications   Prior to Admission medications   Medication Sig Start Date End Date Taking? Authorizing Provider  azelastine (ASTELIN) 0.1 % nasal spray Place 2 sprays into both nostrils 2 (two) times daily as needed for rhinitis. Use in each nostril as directed   Yes Historical Provider, MD  enalapril (VASOTEC) 20 MG tablet Take 20 mg by mouth daily.   Yes Historical Provider, MD  gabapentin (NEURONTIN) 300 MG capsule Take 300 mg by mouth at bedtime.   Yes Historical Provider, MD  hydroxychloroquine (PLAQUENIL) 200 MG tablet Take 200 mg by mouth at bedtime.   Yes Historical Provider, MD  Multiple Vitamins-Minerals (MULTIVITAMIN WITH MINERALS) tablet Take 1 tablet by mouth daily.   Yes Historical Provider, MD  mycophenolate (CELLCEPT) 500 MG tablet Take 1,000 mg by mouth 2 (two) times daily.   Yes Historical Provider, MD  ondansetron (ZOFRAN) 4 MG tablet Take 4 mg by mouth every 6 (six) hours as needed for nausea or vomiting.   Yes Historical Provider, MD  prednisoLONE 5 MG TABS tablet Take 5 mg by mouth daily.   Yes Historical Provider, MD  valACYclovir (VALTREX) 1000 MG tablet Take 1,000 mg by mouth 2 (two) times daily.   Yes Historical Provider, MD  warfarin (COUMADIN) 5 MG tablet Take 5 mg by mouth daily at 6 PM.   Yes Historical Provider, MD  enoxaparin (LOVENOX) 120 MG/0.8ML injection Inject 0.73 mLs (110 mg total) into the skin every 12 (twelve) hours. Patient not taking: Reported on 03/17/2015 12/12/13   Lisette Abu, PA-C  oxyCODONE-acetaminophen (PERCOCET/ROXICET) 5-325 MG per tablet Take 1-2 tablets by mouth every 4 (four) hours as needed (Pain). Patient not taking: Reported on 03/17/2015 12/12/13   Lisette Abu, PA-C   Triage Vitals: BP 105/54 mmHg  Pulse 89  Temp(Src) 97.4 F (36.3 C) (Oral)  Resp 24  Ht 6\' 1"  (1.854 m)  Wt 234 lb (106.142 kg)  BMI 30.88 kg/m2  SpO2 99%   Physical Exam  Constitutional: He is oriented to person, place, and time. He appears well-developed and well-nourished.  Tremulous  HENT:  Head: Normocephalic and atraumatic.  Mouth/Throat: Oropharynx is clear and moist.  Dry lips  Eyes: EOM are normal. Pupils are equal, round, and reactive to light.  Neck: Normal range of motion. Neck supple.  Cardiovascular: Normal rate and regular rhythm.  Exam reveals no gallop and no friction rub.   No murmur heard. Pulmonary/Chest: Effort normal. No respiratory distress. He has no wheezes. He has no rales. He exhibits no tenderness.  Abdominal: Soft. Bowel sounds are normal. He exhibits no distension and no mass. There is no tenderness. There is no rebound and no guarding.  Musculoskeletal: Normal range of motion. He exhibits no edema or tenderness.  No calf swelling or tenderness.  Neurological: He is alert and oriented to person, place, and time.  Moves all extremities without deficit. Sensation is grossly intact.  Skin: Skin is warm and dry. Rash noted. No erythema.  Scaly erythematous rash that wraps around the right flank. Consistent with prev diagnosed shingles  Psychiatric: He has a normal mood and affect. His behavior is normal.  Nursing note and vitals reviewed.   ED Course  Procedures (including critical care time)  DIAGNOSTIC STUDIES: Oxygen Saturation is 99% on RA, normal by my interpretation.    COORDINATION OF CARE: 11:47 PM-Discussed treatment plan which includes CXR, INR, Troponin, UA, CBC, CMP, Lipase, EKG with pt at bedside and pt agreed to  plan.   Labs Review Labs Reviewed  CBC WITH DIFFERENTIAL/PLATELET - Abnormal; Notable for the following:    RBC 3.57 (*)    Hemoglobin 10.5 (*)    HCT 32.6 (*)    RDW 16.8 (*)    Monocytes Relative 13 (*)    All other components within normal limits  COMPREHENSIVE METABOLIC PANEL - Abnormal; Notable for the following:    BUN 45 (*)    Creatinine, Ser 3.65 (*)    GFR calc non Af Amer 18 (*)    GFR calc Af Amer 20 (*)    All other components within normal limits  URINALYSIS, ROUTINE W REFLEX MICROSCOPIC - Abnormal; Notable for the following:    Hgb urine dipstick LARGE (*)    All other components within normal limits  PROTIME-INR - Abnormal; Notable for the following:    Prothrombin Time 25.6 (*)    INR 2.32 (*)    All other components within normal limits  URINE MICROSCOPIC-ADD ON - Abnormal; Notable for the following:  Casts HYALINE CASTS (*)    All other components within normal limits  LIPASE, BLOOD  TROPONIN I  Randolm Idol, ED    Imaging Review Dg Chest Port 1 View  03/17/2015   CLINICAL DATA:  Dizziness, lightheadedness.  Shortness of breath.  EXAM: PORTABLE CHEST - 1 VIEW  COMPARISON:  03/08/2014  FINDINGS: Mild peribronchial thickening, stable since prior study. Heart and mediastinal contours are within normal limits. No focal opacities or effusions. No acute bony abnormality.  IMPRESSION: Mild chronic bronchitic changes.   Electronically Signed   By: Rolm Baptise M.D.   On: 03/17/2015 00:20     EKG Interpretation   Date/Time:  Friday March 16 2015 20:34:32 EDT Ventricular Rate:  89 PR Interval:  128 QRS Duration: 88 QT Interval:  356 QTC Calculation: 433 R Axis:   60 Text Interpretation:  Normal sinus rhythm Normal ECG Confirmed by  Lita Mains  MD, Avanthika Dehnert ([id number]) on 03/17/2015 12:28:24 AM      MDM   Final diagnoses:  SOB (shortness of breath)  Vomiting and diarrhea  Lightheadedness    I personally performed the services described in this  documentation, which was scribed in my presence. The recorded information has been reviewed and is accurate.  Patient's symptoms have improved. No further vomiting in the emergency department. Lightheadedness is improved. Patient is tolerating oral fluid. On review of patient's chart patient with renal insufficiency with creatinine between 2 and 3 last year. He is refusing admission and said he'll follow-up with his primary doctor. He understands he needs to return immediately for any change in his symptoms or any concerns.     Julianne Rice, MD 03/17/15 [phone]

## 2015-03-17 LAB — URINALYSIS, ROUTINE W REFLEX MICROSCOPIC
Bilirubin Urine: NEGATIVE
GLUCOSE, UA: NEGATIVE mg/dL
KETONES UR: NEGATIVE mg/dL
LEUKOCYTES UA: NEGATIVE
Nitrite: NEGATIVE
Protein, ur: NEGATIVE mg/dL
Specific Gravity, Urine: 1.014 (ref 1.005–1.030)
UROBILINOGEN UA: 0.2 mg/dL (ref 0.0–1.0)
pH: 5 (ref 5.0–8.0)

## 2015-03-17 LAB — URINE MICROSCOPIC-ADD ON

## 2015-03-17 LAB — I-STAT TROPONIN, ED: TROPONIN I, POC: 0 ng/mL (ref 0.00–0.08)

## 2015-03-17 LAB — PROTIME-INR
INR: 2.32 — ABNORMAL HIGH (ref 0.00–1.49)
PROTHROMBIN TIME: 25.6 s — AB (ref 11.6–15.2)

## 2015-03-17 MED ORDER — ONDANSETRON 4 MG PO TBDP: ORAL_TABLET | ORAL | Status: DC

## 2015-03-17 NOTE — Discharge Instructions (Signed)
Nausea and Vomiting °Nausea is a sick feeling that often comes before throwing up (vomiting). Vomiting is a reflex where stomach contents come out of your mouth. Vomiting can cause severe loss of body fluids (dehydration). Children and elderly adults can become dehydrated quickly, especially if they also have diarrhea. Nausea and vomiting are symptoms of a condition or disease. It is important to find the cause of your symptoms. °CAUSES  °· Direct irritation of the stomach lining. This irritation can result from increased acid production (gastroesophageal reflux disease), infection, food poisoning, taking certain medicines (such as nonsteroidal anti-inflammatory drugs), alcohol use, or tobacco use. °· Signals from the brain. These signals could be caused by a headache, heat exposure, an inner ear disturbance, increased pressure in the brain from injury, infection, a tumor, or a concussion, pain, emotional stimulus, or metabolic problems. °· An obstruction in the gastrointestinal tract (bowel obstruction). °· Illnesses such as diabetes, hepatitis, gallbladder problems, appendicitis, kidney problems, cancer, sepsis, atypical symptoms of a heart attack, or eating disorders. °· Medical treatments such as chemotherapy and radiation. °· Receiving medicine that makes you sleep (general anesthetic) during surgery. °DIAGNOSIS °Your caregiver may ask for tests to be done if the problems do not improve after a few days. Tests may also be done if symptoms are severe or if the reason for the nausea and vomiting is not clear. Tests may include: °· Urine tests. °· Blood tests. °· Stool tests. °· Cultures (to look for evidence of infection). °· X-rays or other imaging studies. °Test results can help your caregiver make decisions about treatment or the need for additional tests. °TREATMENT °You need to stay well hydrated. Drink frequently but in small amounts. You may wish to drink water, sports drinks, clear broth, or eat frozen  ice pops or gelatin dessert to help stay hydrated. When you eat, eating slowly may help prevent nausea. There are also some antinausea medicines that may help prevent nausea. °HOME CARE INSTRUCTIONS  °· Take all medicine as directed by your caregiver. °· If you do not have an appetite, do not force yourself to eat. However, you must continue to drink fluids. °· If you have an appetite, eat a normal diet unless your caregiver tells you differently. °¨ Eat a variety of complex carbohydrates (rice, wheat, potatoes, bread), lean meats, yogurt, fruits, and vegetables. °¨ Avoid high-fat foods because they are more difficult to digest. °· Drink enough water and fluids to keep your urine clear or pale yellow. °· If you are dehydrated, ask your caregiver for specific rehydration instructions. Signs of dehydration may include: °¨ Severe thirst. °¨ Dry lips and mouth. °¨ Dizziness. °¨ Dark urine. °¨ Decreasing urine frequency and amount. °¨ Confusion. °¨ Rapid breathing or pulse. °SEEK IMMEDIATE MEDICAL CARE IF:  °· You have blood or brown flecks (like coffee grounds) in your vomit. °· You have black or bloody stools. °· You have a severe headache or stiff neck. °· You are confused. °· You have severe abdominal pain. °· You have chest pain or trouble breathing. °· You do not urinate at least once every 8 hours. °· You develop cold or clammy skin. °· You continue to vomit for longer than 24 to 48 hours. °· You have a fever. °MAKE SURE YOU:  °· Understand these instructions. °· Will watch your condition. °· Will get help right away if you are not doing well or get worse. °Document Released: 12/08/2005 Document Revised: 03/01/2012 Document Reviewed: 05/07/2011 °[employer]® Patient Information ©2015 [employer]. This information is not intended   to replace advice given to you by your health care provider. Make sure you discuss any questions you have with your health care provider.  Dizziness Dizziness is a common problem. It is  a feeling of unsteadiness or light-headedness. You may feel like you are about to faint. Dizziness can lead to injury if you stumble or fall. A person of any age group can suffer from dizziness, but dizziness is more common in older adults. CAUSES  Dizziness can be caused by many different things, including:  Middle ear problems.  Standing for too long.  Infections.  An allergic reaction.  Aging.  An emotional response to something, such as the sight of blood.  Side effects of medicines.  Tiredness.  Problems with circulation or blood pressure.  Excessive use of alcohol or medicines, or illegal drug use.  Breathing too fast (hyperventilation).  An irregular heart rhythm (arrhythmia).  A low red blood cell count (anemia).  Pregnancy.  Vomiting, diarrhea, fever, or other illnesses that cause body fluid loss (dehydration).  Diseases or conditions such as Parkinson's disease, high blood pressure (hypertension), diabetes, and thyroid problems.  Exposure to extreme heat. DIAGNOSIS  Your health care provider will ask about your symptoms, perform a physical exam, and perform an electrocardiogram (ECG) to record the electrical activity of your heart. Your health care provider may also perform other heart or blood tests to determine the cause of your dizziness. These may include:  Transthoracic echocardiogram (TTE). During echocardiography, sound waves are used to evaluate how blood flows through your heart.  Transesophageal echocardiogram (TEE).  Cardiac monitoring. This allows your health care provider to monitor your heart rate and rhythm in real time.  Holter monitor. This is a portable device that records your heartbeat and can help diagnose heart arrhythmias. It allows your health care provider to track your heart activity for several days if needed.  Stress tests by exercise or by giving medicine that makes the heart beat faster. TREATMENT  Treatment of dizziness  depends on the cause of your symptoms and can vary greatly. HOME CARE INSTRUCTIONS   Drink enough fluids to keep your urine clear or pale yellow. This is especially important in very hot weather. In older adults, it is also important in cold weather.  Take your medicine exactly as directed if your dizziness is caused by medicines. When taking blood pressure medicines, it is especially important to get up slowly.  Rise slowly from chairs and steady yourself until you feel okay.  In the morning, first sit up on the side of the bed. When you feel okay, stand slowly while holding onto something until you know your balance is fine.  Move your legs often if you need to stand in one place for a long time. Tighten and relax your muscles in your legs while standing.  Have someone stay with you for 1-2 days if dizziness continues to be a problem. Do this until you feel you are well enough to stay alone. Have the person call your health care provider if he or she notices changes in you that are concerning.  Do not drive or use heavy machinery if you feel dizzy.  Do not drink alcohol. SEEK IMMEDIATE MEDICAL CARE IF:   Your dizziness or light-headedness gets worse.  You feel nauseous or vomit.  You have problems talking, walking, or using your arms, hands, or legs.  You feel weak.  You are not thinking clearly or you have trouble forming sentences. It may take a  friend or family member to notice this.  You have chest pain, abdominal pain, shortness of breath, or sweating.  Your vision changes.  You notice any bleeding.  You have side effects from medicine that seems to be getting worse rather than better. MAKE SURE YOU:   Understand these instructions.  Will watch your condition.  Will get help right away if you are not doing well or get worse. Document Released: 06/03/2001 Document Revised: 12/13/2013 Document Reviewed: 06/27/2011 [facility] Patient Information 2015 [place], [place].  This information is not intended to replace advice given to you by your health care provider. Make sure you discuss any questions you have with your health care provider.

## 2015-03-23 ENCOUNTER — Ambulatory Visit
Admit: 2015-03-23 | Disposition: A | Discharge status: Home / Self Care | Payer: Self-pay | Attending: Internal Medicine | Admitting: Internal Medicine

## 2015-03-28 LAB — CBC CANCER CENTER
BASOS ABS: 0 x10 3/mm (ref 0.0–0.1)
Basophil %: 0.2 %
EOS PCT: 2.1 %
Eosinophil #: 0.1 x10 3/mm (ref 0.0–0.7)
HCT: 30.3 % — ABNORMAL LOW (ref 40.0–52.0)
HGB: 9.8 g/dL — ABNORMAL LOW (ref 13.0–18.0)
LYMPHS PCT: 13.8 %
Lymphocyte #: 0.5 x10 3/mm — ABNORMAL LOW (ref 1.0–3.6)
MCH: 29.9 pg (ref 26.0–34.0)
MCHC: 32.4 g/dL (ref 32.0–36.0)
MCV: 92 fL (ref 80–100)
MONOS PCT: 12.9 %
Monocyte #: 0.5 x10 3/mm (ref 0.2–1.0)
Neutrophil #: 2.7 x10 3/mm (ref 1.4–6.5)
Neutrophil %: 71 %
PLATELETS: 146 x10 3/mm — AB (ref 150–440)
RBC: 3.28 10*6/uL — AB (ref 4.40–5.90)
RDW: 18.3 % — ABNORMAL HIGH (ref 11.5–14.5)
WBC: 3.8 x10 3/mm (ref 3.8–10.6)

## 2015-03-28 LAB — PROTIME-INR
INR: 2.6
Prothrombin Time: 27.7 secs — ABNORMAL HIGH

## 2015-03-28 LAB — CREATININE, SERUM
Creatinine: 1.49 mg/dL — ABNORMAL HIGH
EGFR (African American): >60
GFR CALC NON AF AMER: 53 — AB

## 2015-04-10 ENCOUNTER — Emergency Department
Admit: 2015-04-10 | Disposition: A | Discharge status: Home / Self Care | Payer: Self-pay | Admitting: Emergency Medicine

## 2015-04-10 LAB — COMPREHENSIVE METABOLIC PANEL
ALBUMIN: 3.9 g/dL
ALK PHOS: 71 U/L
ALT: 16 U/L — AB
Anion Gap: 5 — ABNORMAL LOW (ref 7–16)
BILIRUBIN TOTAL: 0.7 mg/dL
BUN: 16 mg/dL
CHLORIDE: 108 mmol/L
CO2: 25 mmol/L
Calcium, Total: 8.4 mg/dL — ABNORMAL LOW
Creatinine: 1.34 mg/dL — ABNORMAL HIGH
EGFR (African American): >60
EGFR (Non-African Amer.): >60
Glucose: 96 mg/dL
Potassium: 3.5 mmol/L
SGOT(AST): 19 U/L
SODIUM: 138 mmol/L
Total Protein: 7 g/dL

## 2015-04-10 LAB — URINALYSIS, COMPLETE
BILIRUBIN, UR: NEGATIVE
GLUCOSE, UR: NEGATIVE mg/dL (ref 0–75)
KETONE: NEGATIVE
LEUKOCYTE ESTERASE: NEGATIVE
Nitrite: NEGATIVE
Ph: 5 (ref 4.5–8.0)
Protein: NEGATIVE
SQUAMOUS EPITHELIAL: NONE SEEN
Specific Gravity: 1.013 (ref 1.003–1.030)

## 2015-04-10 LAB — CBC
HCT: 31.4 % — ABNORMAL LOW (ref 40.0–52.0)
HGB: 10.4 g/dL — ABNORMAL LOW (ref 13.0–18.0)
MCH: 30.6 pg (ref 26.0–34.0)
MCHC: 33.2 g/dL (ref 32.0–36.0)
MCV: 92 fL (ref 80–100)
Platelet: 179 10*3/uL (ref 150–440)
RBC: 3.41 10*6/uL — AB (ref 4.40–5.90)
RDW: 17.5 % — ABNORMAL HIGH (ref 11.5–14.5)
WBC: 4 10*3/uL (ref 3.8–10.6)

## 2015-04-10 LAB — LIPASE, BLOOD: LIPASE: 53 U/L — AB

## 2015-04-10 LAB — PROTIME-INR
INR: 2
Prothrombin Time: 22.4 secs — ABNORMAL HIGH

## 2015-04-13 NOTE — Discharge Summary (Signed)
PATIENT NAME:  Jonathon Snow, Jonathon Snow MR#:  [id number] DATE OF BIRTH:  Jul 20, 1961  DATE OF ADMISSION:  07/12/2013 DATE OF DISCHARGE:  07/15/2013    CONSULTANTS: Dr. Inez Pilgrim from [facility] and Dr. Leotis Pain from vascular surgery.   CHIEF COMPLAINT: Pleuritic chest pain.   PRIMARY CARE PHYSICIAN: Milinda Pointer. Jacqualine Code, MD  DISCHARGE DIAGNOSES: 1.  Acute pulmonary embolism in setting of recent deep vein thrombosis, status post inferior vena cava filter.  2.  High blood pressure, likely new diagnosis.  3.  Cyst in the liver. Please follow up as an outpatient.  4.  History of intracranial venous thrombosis in 2007.   DISCHARGE MEDICATIONS:  1.  Amlodipine 5 mg daily.  2.  Lovenox injectable 100 mg every 12 hours until seen by Dr. Inez Pilgrim.   DIET: Low-sodium.   ACTIVITY: As tolerated.   FOLLOWUP: Please follow up with Dr. Inez Pilgrim on Monday at 3:00 at [facility]. : Please follow up with Dr. Lucky Cowboy as an outpatient in 4 to 6 weeks to take out the IVC filter. Please follow up with PCP within 1 to 2 weeks for blood pressure check-up.  SIGNIFICANT LABORATORIES AND IMAGING: Initial BUN 12, creatinine 0.88, sodium 139, potassium 3.9. Albumin was 3.2; otherwise, LFTs were within normal limits. Troponin was negative. WBC 4, hemoglobin 13.3; platelets were 207. INR initially was 1.5. PSA, CA 19-9 were both within normal limits. CEA was within normal limits as well. Thrombophilia work-up so far: Antithrombin activity 72%, slightly low. Factor V Leiden mutation negative. Protein C deficiency negative and protein S deficiency negative.   Echocardiogram showed EF of 50% to 55%, slightly low-normal EF, normal RV size and systolic function, mild mitral valve regurgitation, normal RVSP.   CT of the chest, PE protocol, shows small pulmonary embolus in the left lower lobe segmental pulmonary artery, marked narrowing of the right lower lobe segmental pulmonary artery, may represent sequelae of prior pulmonary embolus. Mild  irregular density in the periphery of the right lower lobe, may represent sequela of prior infarct given the patient's history; however, it is  indeterminate and a followup noncontrast CT chest is recommended in 3 months. Lobular low attenuation density in the right hepatic lobe, may represent multiple cysts or cyst with septation.  HISTORY OF PRESENT ILLNESS AND HOSPITAL COURSE: For full details of H and P, please see the dictation by Dr. Anselm Jungling on July 22 but briefly, this is a pleasant 54 year old male with history of intracranial venous thrombosis in 2007, who took Coumadin prior and was worked up with hematology at [facility]. He presented with a left lower extremity DVT to the ER and was discharged on Xarelto recently. The patient presented back after having some shortness of breath and chest pain, which was pleuritic on the left side. The patient for the last 2 to 3 days prior to admission started to have that lower chest pain and feeling mildly short of breath. He came into the hospital where a CAT scan showed PE on the left side, which corresponded to his pleuritic chest pain. He was admitted to the hospitalist service and started on Lovenox as well as Coumadin initially. Hematology was consulted as well as vascular to see if patient would benefit from an IVC filter. The patient ultimately underwent an IVC filter on July 24 by Dr. Lucky Cowboy. Dr. Inez Pilgrim saw the patient as well and he recommended stopping the Coumadin and maintaining the patient on Lovenox. A heparin anti-Xa level was sent. It is slightly up  at 1.03 and per Dr. Inez Pilgrim, we can discharge the patient on Lovenox at 100 mg q.12 hours for now and he will follow up with him as an outpatient on Monday. At this point, he does not have shortness of breath and his pleuritic chest pain is significantly improved. CEA, PSA and CA 19-9 values have been within normal limits and Dr. Inez Pilgrim will do further thrombophilia work-up as an outpatient. He would also  consider getting a CAT scan of abdomen and pelvis as an outpatient. Actually the patient does exhibit elevated blood pressure and he has been started on amlodipine. The cysts in the liver, which were seen on the CAT scan, could be followed as an outpatient. At this point, the patient is not short of breath, nor is he febrile.   PHYSICAL EXAMINATION:   VITAL SIGNS: His pulse rate is 79. Respiratory rate is 20. Blood pressure is 135/86. O2 sat is 96%.  GENERAL: He is a well-developed Caucasian male lying in bed in no obvious distress. HEENT: Normocephalic, atraumatic. HEART: Regular rate and rhythm on auscultation of his heart sounds. No significant murmurs.  LUNGS: Clear mostly, except for the left base which exhibits mild crackles.  ABDOMEN: Exam is benign. EXTREMITIES: The patient has no significant lower extremity edema.   At this point, he will be discharged with outpatient followup as dictated above.   TOTAL TIME SPENT: 35 minutes.   CODE STATUS: The patient is full code.    ____________________________ Vivien Presto, MD sa:jm D: 07/15/2013 16:46:29 ET T: 07/15/2013 17:51:40 ET JOB#: [id number]  cc: Vivien Presto, MD, <Dictator> [place] Jacqualine Code, MD Simonne Come. Inez Pilgrim, MD Vivien Presto MD ELECTRONICALLY SIGNED 08/04/2013 13:24

## 2015-04-13 NOTE — Op Note (Signed)
PATIENT NAME:  Jonathon Snow, Jonathon Snow MR#:  [id number] DATE OF BIRTH:  02-Oct-1961  DATE OF PROCEDURE:  12/01/2013  PREOPERATIVE DIAGNOSES: 1.  Deep vein thrombosis with previous history of inferior vena cava filter placement for propagation on anticoagulation.  2.  Postphlebitic syndrome.   POSTOPERATIVE DIAGNOSIS:  1.  Deep vein thrombosis with previous history of inferior vena cava filter placement for propagation on anticoagulation.  2.  Postphlebitic syndrome.   PROCEDURES: 1.  Ultrasound guidance for vascular access, right jugular vein.  2.  Catheter placement into inferior vena cava.  3.  Inferior venacavogram.  4.  Attempted retrievable of IVC filter.   SURGEON: Algernon Huxley, M.D.   ANESTHESIA: Local with moderate conscious sedation.   ESTIMATED BLOOD LOSS: 20 mL.  FLUOROSCOPY TIME: 40 minutes.   CONTRAST USED: 30 mL.   INDICATION FOR PROCEDURE: A 54 year old white male who had a filter placed for propagation of DVT on anticoagulation earlier this year. We are here to attempt to remove this.   DESCRIPTION OF PROCEDURE: The patient was brought to the vascular interventional radiology suite. The right neck and chest were sterilely prepped and draped and a sterile surgical field was created. His right jugular vein was visualized with ultrasound and found to be widely patent. It was then accessed under direct ultrasound guidance without difficulty with a Seldinger needle. J-wire and the retrieval sheath were then placed. Imaging was performed which showed the filter was significantly tilted with the tip well incorporated into the endothelial wall medially. I attempted for several minutes to try to retrieve this with a conventional retrieval snare without difficulty. I used to the En-snare, the triple-snare, the gooseneck snare, and the snare that came with the [employer], all without any significant ability to retrieve the filter. I tried to use a buddy wire system. I inflated a 6 mm  balloon where it joined the wall to try to free it. None of these maneuvers were successful. I tried to use catheters and pass wires around the top and snare the wire in order to pull the tip away and again no maneuvers were successful. After we had exceeded 40 minutes of fluoroscopy and I had done all the maneuvers typical for attempted retrieval, it was clear that this was not going to be a successful retrieval and I elected to terminate the procedure. The sheath was removed, pressure was held, and a sterile dressing was placed. The patient tolerated the procedure well and was taken to the recovery room in stable condition.  ____________________________ Algernon Huxley, MD jsd:sb D: 12/01/2013 11:13:38 ET T: 12/01/2013 11:34:53 ET JOB#: [id number]  cc: Algernon Huxley, MD, <Dictator> Algernon Huxley MD ELECTRONICALLY SIGNED 12/12/2013 10:13

## 2015-04-13 NOTE — Consult Note (Signed)
CHIEF COMPLAINT and HISTORY:  Subjective/Chief Complaint DV/PE, consulted for IVC filter   History of Present Illness 54 year old white male with left femoral and popliteal DVT on duplex 2 weeks ago when presented to ER with a few days of left leg pain and edema. He was started on Xarelto at that time and reports taking BID as prescribed. He recently began having chest pain and shortness of breath, returned to ER and found to have PE. He does recall an injury to his left leg mid-June.  He has family history of lower extremity DVT's in his mother and grandmother. 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   PAST MEDICAL/SURGICAL HISTORY:  Past Medical History:   Cerebral blood clots:    Herniated Disc:    Cyst removed from liver:   ALLERGIES:  Allergies:  Vicodin: Rash  HOME MEDICATIONS:  Home Medications:  Medication Instructions Status  Xarelto 15 mg oral tablet 1 tab(s) orally 2 times a day Active   Family and Social History:  Family History DVT    Social History negative tobacco, negative ETOH, negative Illicit drugs   Review of Systems:  Fever/Chills No    Cough No    Sputum No    Abdominal Pain No    Diarrhea No    Constipation No    Nausea/Vomiting No    SOB/DOE Yes    Chest Pain Yes    Tolerating Diet Yes    Physical Exam:  GEN no acute distress   HEENT PERRL, hearing intact to voice, moist oral mucosa   NECK supple    RESP normal resp effort  clear BS    CARD regular rate  no murmur    ABD denies tenderness  soft  normal BS    LYMPH negative neck   EXTR positive edema, Mild left leg edema   SKIN normal to palpation   NEURO cranial nerves intact   PSYCH alert, A+O to time, place, person   RADIOLOGY:  Radiology Results:  [place]:    10-Jul-14 16:45, [place] Color Flow Doppler Low Extrem Left (Leg)  [place] Color Flow Doppler Low Extrem Left (Leg)  REASON FOR  EXAM:    s/p fall 3 weeks ago with continued pain and swelling  COMMENTS:       PROCEDURE: [place]  - US DOPPLER LOW EXTR LEFT  - Jun 30 2013  4:45PM     RESULT: Comparison: None    Technique and findings: Multiple longitudinal and transverse grayscale as   well as color and spectral Doppler images of the left lower extremity   veins were obtained from the common femoral veins through the popliteal   veins.    The left common femoral vein is only partially compressible. There is   some Doppler flow within this vessel. This is consistent with   nonocclusive thrombus. There is occlusive or near occlusive thrombus in     the left popliteal vein. No evidence of deep venous thrombosis in the   left common femoral vein.    Oval hypoechoic mass with central hyperechogenicity in the left groin   likely represents a lymph node with a fatty hilum. It measures 4.7 x 1.1   x 2.3 cm. This is nonspecific. A similar smaller lymph node is also   demonstrated.    IMPRESSION:   1. Occlusive or near occlusive thrombus in the left popliteal vein.  Nonocclusive thrombus in the left femoral vein.  2. Mildly enlarged lymph nodes in the left groin, which are nonspecific.    The preliminary report was called to Anderson Malta, the patient's nurse, by   the ultrasound technologist at 1647 hours 06/30/2013.  Dictation Site: 8        Verified By: Gregor Hams, M.D., MD  LabUnknown:  PACS Image    22-Jul-14 10:22, CT Chest for Pulm Embolism With Contrast  PACS Image  CT:  CT Chest for Pulm Embolism With Contrast  REASON FOR EXAM:    cp hx dvt  COMMENTS:       PROCEDURE: CT  - CT CHEST (FOR PE) W  - Jul 12 2013 10:22AM     RESULT: Comparison: CT of the chest 02/27/2011    Technique: Multiple thin section axial images were obtained from the lung   apices to the upper abdomen following 80 ml Isovue 370 intravenous   contrast, according to the PE protocol. These images were also reviewed   on a Siemens  multiplanar work station.    Findings:   No mediastinal, hilar, or axillary lymphadenopathy. There are a few   mildly prominent, but not pathologically enlarged bilateral axillary     lymph nodes. There is a lobular area of low-attenuation in the right   hepatic lobe, which measure water density, which likely represents   multiple cysts versus a large cyst with septations. This appears slightly   increased in conspicuity from prior. This region measures approximately   7.0 x 6.8 cm in greatest axial dimension.    The thoracic aorta is normal in caliber. Calcifications are seen in the   coronary arteries. There is a small filling defect in an anterior left   lower lobe segmental pulmonary artery. There is marked narrowing of a   right lower lobe segmental pulmonary artery which may represent sequela   of prior pulmonary embolus.    There is nonspecificsmall irregular density in the periphery of the   right lower lobe. This could represent sequela of to prior infarct given   the patient's history, but is nonspecific. Opacities in the lingula are     likely secondary to atelectasis or scarring. The central airways are   patent.    Small lucent lesion in a vertebral body in the upper thoracic spine   likely represents a hemangioma. There are trace bilateral pleural   effusions.    IMPRESSION:   1. Small pulmonary embolus in a left lower lobe segmental pulmonary   artery.  2. Marked narrowing of a right lower lobe segmental pulmonary artery may   represent sequela of prior pulmonary embolus. Mild irregular density in   the periphery of the right lower lobe may represent sequela of a prior   infarct given the patient's history. However, it is indeterminate and a   followup noncontrast chest CT is recommended in 3 months.  3. Lobular low-attenuation density in the right hepatic lobe may   represent multiple cysts or cyst with septations. It appears slightly   increased in conspicuity  from prior. Given the somewhat unusual   morphology, continued followup CT or MRI is suggested.    This was called to Sabino Snipes at 1042 hours 07/12/2013.        Verified By: Gregor Hams, M.D., MD   ASSESSMENT AND PLAN:  Assessment/Admission Diagnosis 54 year old white male with left femoral and popliteal DVT on duplex 2 weeks ago when presented to  ER with a few days of left leg pain and edema. He was started on Xarelto at that time and reports taking BID as prescribed. He recently began having chest pain and shortness of breath, returned to ER and found to have PE. He does recall an injury to his left leg mid-June.  He has family history of lower extremity DVT's in his mother and grandmother. 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   Plan Left femoral/popliteal DVT, started on xarelto and returned 2 weeks later with shortness of breath and chest pain, found to have PE. He failed anticoagulation. Positive personal hx of venous thrombosis and family history. Hematology evaluation started for hypercoag disorders and planning CT for lymphadenopathy. Vascular consulted for consideration of IVC filter. D/w Dr. Lucky Cowboy. Recommended proceeding with IVC filter which is indicated for failing anticoagulation. Discussed with patient. Patient will consider IVC filter, but at this time does not want it scheduled. Will follow.   Electronic Signatures: Su Grand (PA-C)  (Signed 24-Jul-14 11:51)  Authored: Chief Complaint and History, PAST MEDICAL/SURGICAL HISTORY, ALLERGIES, HOME MEDICATIONS, Family and Social History, Review of Systems, Physical Exam, RADIOLOGY, Assessment and Plan   Last Updated: 24-Jul-14 11:51 by Su Grand (PA-C)

## 2015-04-13 NOTE — H&P (Signed)
PATIENT NAME:  Jonathon Snow, TUDISCO MR#:  [id number] DATE OF BIRTH:  December 18, 1961  DATE OF ADMISSION:  07/12/2013  PRIMARY CARE PHYSICIAN: Fara Olden B. Jacqualine Code, MD  REFERRING EMERGENCY ROOM PHYSICIAN: Latina Craver, MD  CHIEF COMPLAINT: Chest pain.   HISTORY OF PRESENTING ILLNESS: A 54 year old male with no known past medical history except having cerebral venous thrombosis 7 years ago, who presented to ER 10 days ago with having left leg swelling and pain for a few days and was found having left leg DVT and discharged from ER on Xarelto. The patient says he was taking Xarelto regularly as prescribed 2 times a day for the last 10 days, but his pain and swelling in the left leg was not going away and stayed the same. For the last 2 to 3 days, he also started having pain in left lower chest, and he was feeling mildly short of breath with that. So, he went to his primary care physician today, and he told him that clot from the leg might have moved to his chest and suggested for him to go to the Emergency Room. In the ER, he was tested, positive for pulmonary embolus by CT angiogram, and so he is referred to hospitalist service for hospital admission as he might have failed outpatient therapy with Xarelto. On further questioning, he denies any recent travel, trauma or surgeries, but he agrees that he had intracranial venous thrombosis in 2007, and he took Coumadin for 6 months.   REVIEW OF SYSTEMS:   CONSTITUTIONAL: Negative for fever, fatigue, weakness. Pain in the left leg and left chest is present.  EYES: No blurring or double vision, pain or redness.  EARS, NOSE, THROAT: No tinnitus, ear pain or hearing loss.  RESPIRATORY: No cough, wheezing, hemoptysis, but has some chest pain on the left lower side.  CARDIOVASCULAR: No orthopnea, edema on the legs, arrhythmia or palpitations.  GASTROINTESTINAL: No nausea, vomiting, diarrhea or abdominal pain.  GENITOURINARY: No dysuria, hematuria or increased frequency of  the urination.  SKIN: No acne or rashes.  MUSCULOSKELETAL: No pain in the joints or swelling in the joints.  NEUROLOGICAL: No numbness, weakness, tremors or vertigo. PSYCHIATRIC: No anxiety, insomnia or bipolar disorder.   PAST MEDICAL HISTORY: Positive for DVT diagnosed on 06/30/2013 and was started on Xarelto by ER and also had intracranial venous thrombosis in 2007 and took Coumadin up to 6 months for that. Was following with a hematologist in [place] at that time, but was never tested for familial or congenital hematologic abnormalities.   PAST SURGICAL HISTORY: Removal of cyst from the liver, which was benign.   SOCIAL HISTORY: He was never a smoker, never alcohol drinker and does not do any illegal drugs. He is working as a Freight forwarder in [employer].   FAMILY HISTORY: Positive for venous thrombosis in his grandmother and his mother, which were not provoked by any major surgeries or long-term staying immobile in the bed.   MEDICATION AT HOME: Xarelto 15 mg 2 times a day for the last 10 days.   PHYSICAL EXAMINATION: VITAL SIGNS: In ER, temperature 98.2, pulse 65, respirations 22, blood pressure 168/79 and pulse oximetry 97 on room air.  GENERAL: The patient is alert, oriented to time, place and person, and he is cooperative with history taking and physical examination.  HEENT: Head atraumatic. Conjunctivae pink. Oral mucosa moist.  NECK: Supple. No JVD.  RESPIRATORY: Bilaterally clear and equal air entry.  CARDIOVASCULAR: S1, S2 present, regular. No murmur.  ABDOMEN: Soft, nontender. Bowel sounds present. No organomegaly.  SKIN: No rashes.  LEGS: Left leg is swollen compared to the right. No calf tenderness. No redness. Pulses present distally, both feet.  NEUROLOGICAL: Power 5 out of 5. Following commands. No tremors.  PSYCHIATRIC: Does not appear in any acute psychiatric illness at this time.   IMAGING: As per the venous Doppler study on 10th of July, he has left leg DVT. CT chest  for pulmonary embolism done today: Small pulmonary embolism, left lower lobe segmental pulmonary artery, and marked narrowing of right lower lobe segmental pulmonary artery. Lobular low-attenuation density in right hepatitic lobe, representing multiple cysts or cyst with septation, slightly increased in conspicuity from prior.   LABORATORY RESULTS: Glucose 93, BUN 12, creatinine 0.88, sodium 139, potassium 3.9, calcium 8.5, total protein 7.5, albumin 3.2, SGOT 23, SGPT 25. CBC within acceptable range. Prothrombin time 18.4, INR 1.5 and activated PTT 35.5.   ASSESSMENT AND PLAN:  66. A 54 year old male who was recently diagnosed with deep vein thrombosis 10 days ago and started on Xarelto as outpatient, who came with left-sided chest pain and found having pulmonary embolism, most likely this is due to breaking down of the clot in the left leg. The patient was taking Xarelto as outpatient, but there is no decrease in the swelling or pain in the left leg in the last 10 days, as per the patient, so will switch him to therapeutic dose of Lovenox and will start him on Coumadin. He had history of intracranial venous thrombosis in the past, so will test him for any hematological abnormalities, though they might be abnormal due to he is already on Xarelto. Will also call hematologic consult for further workup and assistance in this case.  2. Hypertension. The patient is never having history of hypertension. This might be due to just being stressed to be in the hospital, but systolic blood pressure is in the range of 170, so will start him on amlodipine low dose, and if blood pressure comes under control, we might discontinue later on.  3. Cyst in the liver, which is slightly changed from the past, as per radiologist. Will need followup as outpatient.   TOTAL TIME SPENT ON THIS ADMISSION: 50 minutes.   CODE STATUS: Full code.   ____________________________ Ceasar Lund Anselm Jungling, MD vgv:OSi D: 07/12/2013  12:00:19 ET T: 07/12/2013 12:15:17 ET JOB#: [id number]  cc: Ceasar Lund. Anselm Jungling, MD, <Dictator> Vaughan Basta MD ELECTRONICALLY SIGNED 07/12/2013 16:12

## 2015-04-13 NOTE — Consult Note (Signed)
Brief Consult Note: Diagnosis: PE, DVT, INGUINAL LYMPHADENOPATHY ALSO PERSONAL HX OF CNC CLOT, FOLLOWED INJURY, ALSO FH CLOTS.   Patient was seen by consultant.   Comments: PATIENT SEEN CHART REVIEWED DICTATED NOTE TO FOLLOW. [name] KitchenPLAN CHECK [facility] RECORDS FOR COAG W/U IN THE PAST.[name] KitchenADD LUPUS INHIBITOR AND CARDIOLIPIN ABS AND BETA GLYCOPROTEIN AND ANA. WOULD KEEP ON LOVENOX, NO COUMADIN FOR NOW. ONE DOSE WAS GIVEN THIS AFTERNOON, I HAVE HELD  WILL CHECK ANTI X A LEVEL. WILL CHECK CEA, PSA, CA 19/9. WILL CHECK ABDO AND PELVIS CT FOR LYMPHADENOPATHY, WILL HOLD UNTIL STABLE MAY SCAN IN ONE OR 2 DAYS TIME,  IF NEGATIVE LATER F/U INGUINAL NODE, WHICH MAY BE REACTIVE TO CLOT OR INFLAMMATION, HE ORIGINALLY INJURED HIS LEG, BUT LESS LIKELY MAY BE A PRIMARY PATHOLOGY. LATER DETERMINATION RE LENGTH OF TX AND ALTERNATIVE TO LOVENOX. I HAVE CONSULTED VASCULAR SURGERY AND DISCUSSED WITH DR Lucky Cowboy WHO PLANS AN IVC FILTER IN AM.  Electronic Signatures: Dallas Schimke (MD)  (Signed 23-Jul-14 20:54)  Authored: Brief Consult Note   Last Updated: 23-Jul-14 20:54 by Dallas Schimke (MD)

## 2015-04-13 NOTE — Consult Note (Signed)
PATIENT NAME:  Jonathon Snow, Jonathon Snow MR#:  [id number] DATE OF BIRTH:  [birth year]  DATE OF CONSULTATION:  07/13/2013  REFERRING PHYSICIAN:   CONSULTING PHYSICIAN:  Simonne Come. Inez Pilgrim, MD  HISTORY OF PRESENT ILLNESS:  Jonathon Snow is a 54 year old man who was admitted on 07/22. I saw and evaluated him on 07/23. His case was also discussed with vascular surgery and note was placed on the chart and this full narrative is delayed until the current time. The patient, who is 54 years old, was admitted with pulmonary embolism on 07/22. The pertinent history is that 10 days earlier he was in the Emergency Room with left leg swelling and pain. He had recently fallen and hurt his leg. He had a bit swelling around the knee, but it did not get better. He came to the Emergency Room where he was found to have a DVT on ultrasound, popliteal and femoral, with also some inguinal adenopathy. He was discharged from the emergency room on Xarelto.  He indicated he was taking the medicine regularly, but pain and swelling in the left leg did not improve, although he had been up on his leg, and then for 2 or 3 days prior to admission he was having pain in the left lower chest and mildly short of breath. He was directed to the Emergency Room and CT showed evidence of new plus also old pulmonary embolism. He was admitted to the hospital and started on Lovenox. His course in the ICU has so far been uneventful.   PAST MEDICAL HISTORY: Notable for removal of a liver cyst, which was benign. He had the DVT on 07/10 as stated. In 2007 he had a head injury and then intracranial venous thrombosis for which he took Coumadin for 6 months and was evaluated by hematology at [facility], followed up for years time with multiple testing.   FAMILY HISTORY: Is notable for mother and grandmother had clots in their 5s and 81s.   SOCIAL HISTORY: No alcohol. No smoking.   MEDICATIONS: Have only been Xarelto 15 mg twice a day for the last 10 days.   REVIEW OF  SYSTEMS:  He did not have headache or dizziness or chills or sweats or visual disturbances, weakness or fatigue. No ear or jaw pain. No cough or wheezing, but left-sided chest pain and some shortness of breath as noted. No palpitations, retrosternal chest pain. No ear or jaw pain. No nausea, vomiting, diarrhea. No dysuria or hematuria. No other joint pain. No numbness, tingling or weakness of the extremities. No history of anxiety or depression. No prior easy bruising or bleeding.   PHYSICAL EXAMINATION: GENERAL: The patient was alert and cooperative, in no acute distress.  HEENT: Sclerae no jaundice.  MOUTH: No thrush.  LYMPHATIC:  No palpable lymph nodes in the neck, supraclavicular, submandibular, or axilla.  LUNGS: Clear.  HEART: Regular.  ABDOMEN: Benign, nontender. No palpable mass or organomegaly.  NEUROLOGIC: Grossly nonfocal. Moving all extremities. I did not test his gait. SKIN:  No rash. No bruising. EXTREMITIES:  Left leg, below the knee, has slightly increased edema on the  right and some residual tenderness in the pretibial area, but no calf tenderness. No redness or warmth.  PSYCH:  His mood and affect was normal.  LABORATORY AND DIAGNOSTICS:  X-ray studies are prior noted. A Doppler study on 07/10 showed DVT in the left leg, femoral and popliteal. CT chest for pulmonary embolism showed left lower lobe small embolism and narrowing on the right possibly representing  old clot. He also had low attenuation density in the right lobe of the liver showing an increase in what are felt to be septated cysts.    His labs on admission:  His pro time was 18.4, INR was 1.5 and PT was 35.5, but he had been on Xarelto.  His creatinine was 0.88. The liver function tests were normal. His other results:  His CBC was unremarkable with a white count of 4.0, normal differential, hemoglobin 13.6, platelets 200. Liver chemistries were normal. Albumin was 3.2.   IMPRESSION AND PLAN: The patient has Xarelto  failure. Looks like he had a new pulmonary embolism and persistent swelling in the leg. There is also x-ray evidence of unclear significance of what might be a prior clot in the lung. The patient is now on Lovenox. He was given 1 dose of Coumadin, but I held further Coumadin. I feel at the current time he should have Lovenox continued. We will check the heparin 10a level and adjust the dose. I would not start him on Coumadin. I have consulted vascular surgery, Dr. Lucky Cowboy, about the indication for IVC filter given that he failed anticoagulant regimen. I would later check his prior records at [facility] about old or congenital clotting defects. Would retest him for possibility of new or acquired issues which will include an ANA and cardiolipin and beta glycoprotein antibodies and lupus inhibitor and tumor markers for any malignancy. He should have age-appropriate cancer screening evening though the first clot looked like it was provoked by an injury. This will include colonoscopy if he has not had one. He is now age 54, although we would delay that at least a month with recent pulmonary embolism. Also, he had ultrasound indicated inguinal lymphadenopathy. This is probably reactive to his original injury and clot, but it is possible that he could have primary pathology lymphoma or other pathology or adenopathy is contributed to the obstruction and lead to clot so he will need abdominal pelvic CT scan and depending on the findings possible CT or ultrasound followup later of the inguinal findings until they resolved.   SUMMARY AND PLAN: Watch the CBC on Lovenox. Document heparin 10a and adjust Lovenox. If have consulted vascular surgery. Will get tumor markers, cardiolipin antibodies and lupus inhibitor testing. We will go on and get a CT of the abdomen and pelvis. We will follow the groin findings. We will check his old records from [facility]. Later when he is stable and followed in the [place] determination about  the appropriate long-term anticoagulation. Currently I would recommend keeping him on Lovenox.  ____________________________ Simonne Come Inez Pilgrim, MD rgg:sb D: 07/14/2013 09:58:00 ET T: 07/14/2013 10:20:13 ET JOB#: [id number]  cc: Simonne Come. Inez Pilgrim, MD, <Dictator> Dallas Schimke MD ELECTRONICALLY SIGNED 07/26/2013 9:36

## 2015-04-13 NOTE — Op Note (Signed)
PATIENT NAME:  Jonathon Snow, VANDUNK MR#:  [id number] DATE OF BIRTH:  January 09, 1961  DATE OF PROCEDURE:  07/14/2013  PREOPERATIVE DIAGNOSIS: Deep vein thrombosis with failure of anticoagulation and pulmonary embolus while on anticoagulation.   POSTOPERATIVE DIAGNOSIS: Deep vein thrombosis with failure of anticoagulation and pulmonary embolus while on anticoagulation.   PROCEDURES PERFORMED: 1.  Ultrasound guidance of right femoral vein.  2.  Catheter placement into inferior vena cava.  3.  Inferior venacavogram.  4.  Placement of a [employer] IVC filter.  SURGEON: Algernon Huxley, MD  ANESTHESIA: Local.   ESTIMATED BLOOD LOSS: Minimal.   INDICATION FOR PROCEDURE: A 54 year old white male who had a DVT. He was on Xarelto for 10 to 11 days and then was found to have a pulmonary embolus. This would constitute a failure of anticoagulation as he had been on an appropriate therapy dose for well over a week and had a new pulmonary embolus.  As such IVC filter is indicated to avoid further thromboembolic complications.   DESCRIPTION OF PROCEDURE: The patient is brought to the vascular and interventional radiology suite. The patient's groins were sterilely prepped and draped, and a sterile surgical field was created. The right femoral vein was visualized under ultrasound and found to be patent. It was then accessed under ultrasound guidance without difficulty with a Seldinger needle. A J-wire was then placed. After skin nick and dilatation, the delivery sheath was placed into the inferior vena cava and an inferior venacavogram was performed. This demonstrated a patent vena cava with the level of the renal veins at L1. The filter was then deployed below the level of the renal veins.  The delivery sheath was then removed and pressure was held in the groin. Sterile dressing was placed. The patient tolerated the procedure well and was taken to the recovery room in stable condition. ____________________________ Algernon Huxley, MD jsd:sb D: 07/14/2013 13:17:00 ET T: 07/14/2013 15:38:12 ET JOB#: [id number]  cc: Algernon Huxley, MD, <Dictator> Algernon Huxley MD ELECTRONICALLY SIGNED 07/18/2013 12:29

## 2015-04-14 NOTE — Consult Note (Signed)
PATIENT NAME:  Jonathon Snow, Jonathon Snow MR#:  [id number] DATE OF BIRTH:  [birth year]  DATE OF CONSULTATION:  03/21/2014  REFERRING PHYSICIAN:   CONSULTING PHYSICIAN:  Manya Silvas, MD  HISTORY OF PRESENT ILLNESS:  The patient is a 54 year old white male who has been in and out of the hospital over the last few weeks. Consult will be limited to abdominal pain.   The patient says he has a left upper quadrant abdominal pain occurs like a  spasm. It will last a minute or less. Rarely it is on the right, mostly on the left upper abdomen. There is no relationship to eating of this pain. He has about 6 or 7 spells of this a day. He has not noticed any affect on this from having a bowel movement. The pain does not awaken him from sleep. He denies diarrhea and no constipation. He has had occasional nausea and vomiting. He did vomit once yesterday morning, being Monday. It was about 9:30 in the morning 2 hours after he drank an orange soda.  He does say he seems to belch more and sometimes belching seems to be related to the spasm. Sometimes the belches are significant. He describes them as hard and painful.   His last colonoscopy was 2010 with Dr. Gustavo Lah. He had an upper endoscopy recently by Dr. Rayann Heman partly for abnormal  CAT scan that suggested a mass in the cardia and none was seen in endoscopy.   At the time of the interview, he was not having any pain. He was asked to cough and this did not produce any pain.   FAMILY HISTORY: Negative for colon cancer or colon polyps. Father passed away at age 44 from heart trouble.   The patient has had recurrent problems with thromboembolism well outlined in other notes. He has a filter in his vena cava. There was an attempt made to remove this filter, but it was not able to be removed. He had Xarelto and failed this and is currently on warfarin.   He has been admitted to the hospital because of worsening renal function and he was noted to have elevated free kappa and  lambda light chains on blood work done on the 25th. Stool for Hemoccults were ordered previously, but was not done.  I ordered a stool for Hemoccults yesterday and this was not done either.   PHYSICAL EXAMINATION:  Examination of his abdomen showed no tenderness. No palpable hepatosplenomegaly. He is not able to take much of a deep breath, which he attributes to recurrent pulmonary emboli.   ASSESSMENT: The patient has spells of crampy abdominal pain brief nature. He has had a colonoscopy 5 years ago. He has had pulmonary emboli within the last few weeks. He had a CAT scan that did not show any colonic masses. At this time, I would prefer not to put him through a colonoscopy and a prep with his recurrent pulmonary emboli unless he had rectal bleeding or symptoms suggesting more of colonic mass or tumor. I would need to ask Dr. Inez Pilgrim about his elevated kappa and lambda chains, if there is a possibility of amyloidosis in this patient, which possibly could explain problems with belching and spasm.   LABORATORY DATA: Glucose 83. BUN 12, creatinine 1.84, sodium 135, potassium 3.5, chloride 103, CO2 of 29, calcium 7.6, magnesium 1.5, LDH 159. Liver panel normal except albumin 2.6. White count 2.4, hemoglobin 10.7, platelet count 130,000. Prothrombin time 24.9, INR of 2.3. Urinalysis shows positive for red cells.  His ultrasound was negative.   ASSESSMENT AND PLAN: Episodic brief spells of abdominal discomfort also with belching.   These symptoms are not characteristic of any particular common entity. There may be an element of spasm. There may be an element of motility from some underlying infiltrative condition. It might be worthwhile to repeat his colonoscopy after a few weeks of time have past and some improvement has been made in his respiratory function. I will ask Dr. Inez Pilgrim about the elevated kappa and lambda light chains in his blood.     ____________________________ Manya Silvas,  MD rte:tc D: 03/21/2014 18:03:00 ET T: 03/21/2014 18:49:26 ET JOB#: [id number]  cc: Simonne Come. Inez Pilgrim, MD Manya Silvas, MD, <Dictator>   Manya Silvas MD ELECTRONICALLY SIGNED 04/03/2014 14:17

## 2015-04-14 NOTE — Consult Note (Signed)
Chief Complaint:  Subjective/Chief Complaint NO ACUTE COMPLAINTS   VITAL SIGNS/ANCILLARY NOTES: **Vital Signs.:   08-Apr-15 21:50  Vital Signs Type Q 4hr  Temperature Temperature (F) 98.4  Celsius 36.8  Temperature Source oral  Pulse Pulse 75  Respirations Respirations 18  Systolic BP Systolic BP 196  Diastolic BP (mmHg) Diastolic BP (mmHg) 91  Mean BP 116  Pulse Ox % Pulse Ox % 97  Pulse Ox Activity Level  At rest  Oxygen Delivery Room Air/ 21 %   Brief Assessment:  GEN no acute distress   Respiratory normal resp effort   Gastrointestinal details normal Nontender   Additional Physical Exam neuro grossly non focal   Lab Results:  Hepatic:  08-Apr-15 08:04   Bilirubin, Total 0.6  Alkaline Phosphatase 85 (45-117 NOTE: New Reference Range 11/11/13)  SGPT (ALT) 17  SGOT (AST) 18  Total Protein, Serum 7.6  Albumin, Serum  2.9  Routine Chem:  08-Apr-15 08:04   Glucose, Serum 86  BUN  25  Creatinine (comp)  2.48  Sodium, Serum 137  Potassium, Serum 3.6  Chloride, Serum 105  CO2, Serum 28  Calcium (Total), Serum  8.2  Osmolality (calc) 278  eGFR (African American)  33  eGFR (Non-African American)  29 (eGFR values <35m/min/1.73 m2 may be an indication of chronic kidney disease (CKD). Calculated eGFR is useful in patients with stable renal function. The eGFR calculation will not be reliable in acutely ill patients when serum creatinine is changing rapidly. It is not useful in  patients on dialysis. The eGFR calculation may not be applicable to patients at the low and high extremes of body sizes, pregnant women, and vegetarians.)  Result Comment TBIL - Cause of Delta Check is clinically  - insignificant. Both previous and current  - Total Bilirubin results are less than 1.0.  - -PBP 03/29/2014.  Result(s) reported on 29 Mar 2014 at 08:25AM.  Anion Gap  4  Magnesium, Serum  1.6 (1.8-2.4 THERAPEUTIC RANGE: 4-7 mg/dL TOXIC: > 10 mg/dL  -----------------------)   Routine UA:  08-Apr-15 12:43   Color (UA) Yellow  Clarity (UA) Clear  Glucose (UA) Negative  Bilirubin (UA) Negative  Ketones (UA) Negative  Specific Gravity (UA) 1.006  Blood (UA) 3+  pH (UA) 7.0  Protein (UA) Negative  Nitrite (UA) Negative  Leukocyte Esterase (UA) Negative (Result(s) reported on 29 Mar 2014 at 01:11PM.)  RBC (UA) 410 /HPF  WBC (UA) 1 /HPF  Bacteria (UA) NONE SEEN  Epithelial Cells (UA) NONE SEEN  Result(s) reported on 29 Mar 2014 at 01:11PM.  Routine Coag:  08-Apr-15 08:04   Prothrombin  19.2  INR 1.7 (INR reference interval applies to patients on anticoagulant therapy. A single INR therapeutic range for coumarins is not optimal for all indications; however, the suggested range for most indications is 2.0 - 3.0. Exceptions to the INR Reference Range may include: Prosthetic heart valves, acute myocardial infarction, prevention of myocardial infarction, and combinations of aspirin and anticoagulant. The need for a higher or lower target INR must be assessed individually. Reference: The Pharmacology and Management of the Vitamin K  antagonists: the seventh ACCP Conference on Antithrombotic and Thrombolytic Therapy. CQIWLN.9892Sept:126 (3suppl): [id number] A HCT value >55% may artifactually increase the PT.  In one study,  the increase was an average of 25%. Reference:  "Effect on Routine and Special Coagulation Testing Values of Citrate Anticoagulant Adjustment in Patients with High HCT Values." American Journal of Clinical Pathology 2006;126:400-405.)    13:59  Prothrombin  17.9  INR 1.5 (INR reference interval applies to patients on anticoagulant therapy. A single INR therapeutic range for coumarins is not optimal for all indications; however, the suggested range for most indications is 2.0 - 3.0. Exceptions to the INR Reference Range may include: Prosthetic heart valves, acute myocardial infarction, prevention of myocardial infarction, and  combinations of aspirin and anticoagulant. The need for a higher or lower target INR must be assessed individually. Reference: The Pharmacology and Management of the Vitamin K  antagonists: the seventh ACCP Conference on Antithrombotic and Thrombolytic Therapy. YOKHT.9774 Sept:126 (3suppl): [id number]. A HCT value >55% may artifactually increase the PT.  In one study,  the increase was an average of 25%. Reference:  "Effect on Routine and Special Coagulation Testing Values of Citrate Anticoagulant Adjustment in Patients with High HCT Values." American Journal of Clinical Pathology 2006;126:400-405.)  Routine Hem:  08-Apr-15 08:04   WBC (CBC)  2.9  RBC (CBC)  3.65  Hemoglobin (CBC)  10.2  Hematocrit (CBC)  30.4  Platelet Count (CBC)  139  MCV 83  MCH 28.0  MCHC 33.6  RDW  14.7  Neutrophil % 67.2  Lymphocyte % 21.5  Monocyte % 7.9  Eosinophil % 2.9  Basophil % 0.5  Neutrophil # 2.0  Lymphocyte #  0.6  Monocyte # 0.2  Eosinophil # 0.1  Basophil # 0.0 (Result(s) reported on 29 Mar 2014 at 08:25AM.)   Assessment/Plan:  Assessment/Plan:  Assessment SEE EARLIER NOTE, NEPHRITIS, LIKELY LUPUS, STABLE PANCYTOPENIA, NEUTROPHILS NORMAL, INR CONTINUING TO NORMALIZE, NO BLEEDING, NO ACUTE COMPLAINTS. DISCUSSED RENAL BX WITH NEPHROLOGY. PLANNED FOR FRIDAY 4/10. ALSO DISCUSSED POSSIBLE  RENAL VEIN U/S   Plan AS INR FALLING START HEPARIN DRIP, WITH PLANS TO D/C AT LEAST 6 HRS PRIOR TO RENAL BX,  THEN AS PER NEPHROLOGY NO ANTICOAGULATION FOR UP TO 7 DAYS, THEN RESTART COUMADIN WITH BRIDGE LOVENOX.[name] KitchenLATER RECONSULT GI   Electronic Signatures: Dallas Schimke (MD)  (Signed 08-Apr-15 23:34)  Authored: Chief Complaint, VITAL SIGNS/ANCILLARY NOTES, Brief Assessment, Lab Results, Assessment/Plan   Last Updated: 08-Apr-15 23:34 by Dallas Schimke (MD)

## 2015-04-14 NOTE — Consult Note (Signed)
PATIENT NAME:  Jonathon Snow, Jonathon Snow MR#:  [id number] DATE OF BIRTH:  1961-08-11  DATE OF CONSULTATION:  03/09/2014  CONSULTING PHYSICIAN:  Payton Emerald, NP  ATTENDING:  Dr. Vianne Bulls  PRIMARY:  Dr. Debbora Dus   REASON FOR CONSULT:  Intractable nausea and vomiting.    HISTORY OF PRESENT ILLNESS: Jonathon Snow is a 54 year old Caucasian gentleman, who presented back to the emergency room for the chief complaint of weakness with intractable nausea and vomiting. He was just discharged from the hospital Monday of this week. He had been hospitalized for pneumonia. 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 He does have an IVC filter in place. He is on warfarin 5 mg p.o. daily. Last dose of warfarin was yesterday evening. The patient states when he was discharged from the hospital, he was not feeling well still. He was discharged with 2 antibiotics, Augmentin 875 mg twice a day for 7 days as well as azithromycin 500 mg for 7 days. Even prior to the admission, he states that every time he tried to eat something, he would end up being nauseated. Since he has been home, though, every time he has eaten something or taken his antibiotic, he will end up vomiting. Last occurrence of vomiting was an hour prior to presenting to the Emergency Room. States that since he has been admitted, he is extremely weak still, especially with any form of exertion. No abdominal pain. The patient has been experiencing spasms to his abdomen at times. Normally his bowels move on average every day. He states that he had not had a bowel movement since Monday, and actually had been given something prior to him being discharged in the form of a laxative, and since that time has not had a bowel movement. No similar symptoms to this in the past.   On review of his previous imaging studies and diagnostic evaluation during his hospitalization, he had an abdominal ultrasound that was done on  March 12, which revealed a septated structure in the right lower lobe of the liver, measuring up to 6 cm. Given cross-modality differences, there was no evidence of enlargement or internal hemorrhage from 07/20/2013. There was a 1 cm sludge ball or polyp, which recommends surveillance with an abdominal ultrasound in 6 months. There is, however, gallbladder tenderness and mild wall thickening. Thus, it could not exclude possible early acute cholecystitis. HIDA scan was done the following day, which revealed ejection fraction to be 84%, a normal study. Chest PA and lateral was done on the 18th which revealed slight increase in bibasilar disease. Differential considerations are areas of atelectasis versus mild infiltrate. Small bilateral effusions. Interstitial findings may represent component of pulmonary vascular congestion, with an underlying component of chronic interstitial fibrotic changes. Color Doppler of lower extremities: No evidence of DVT in the right lower extremity.   The right upper quadrant abdominal pain does radiate around his side through to his back as well as underneath his shoulder blades into his shoulder off and on over the previous few weeks.   HOME MEDICATIONS: Warfarin 5 mg daily, tramadol 50 mg daily, Flonase 50 mcg 1 spray each nostril daily, MiraLAX as needed for constipation, Augmentin 875 mg b.i.d. for 7 days, and azithromycin 500 mg once a day for 7 days.   ALLERGIES: VICODIN.  PAST MEDICAL HISTORY:  PE, DVT July 2014, pneumonia March 2015, right ankle fracture.   SURGICAL HISTORY: Right ankle surgery and  IVC filter.   FAMILY HISTORY: No history of neoplasm. Significant for coronary artery disease.   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   REVIEW OF SYSTEMS:  All 10 systems are being checked, otherwise unremarkable than what is stated above.   PHYSICAL EXAMINATION: VITAL SIGNS: Temperature 98.3 with a pulse of 75, respirations are  18, blood pressure 129/78 and pulse ox is 95% on room air.  GENERAL: Well-developed, ill-appearing, 54 year old Caucasian gentleman. Evidence of mild respiratory distress, very weak appearance.  HEENT: Normocephalic, atraumatic. Pupils equal, reactive to light. Conjunctivae clear. Sclerae anicteric. Oral mucosa is dry, intact.  NECK: Supple. Trachea midline. No lymphadenopathy or thyromegaly.  PULMONARY: Essentially clear to auscultation throughout. No wheezes, no rales.  CARDIOVASCULAR: Regular rhythm, S1, S2. No murmurs, no gallops.  ABDOMEN: Soft, nondistended. Bowel sounds slightly hypoactive. No bruits. No masses. Tenderness noted to right upper quadrant of abdomen.  SKIN: Turgor decreased. No lesions. No rashes.  NEUROLOGIC: No gross neurological deficits.  PSYCHIATRIC: Alert and oriented x 4.   LABORATORY/DIAGNOSTIC: Diagnostic study results as noted up under history. Additionally, chemistry panel yesterday:  Creatinine was 1.6, with sodium of 132. EGFR is low at 48 on admission, which remains low, but improved to 51 today. Creatinine has gone from 1.60 on admission to 1.54 today. Troponin level has been less than 0.02. CBC: RBC is 4.16, hemoglobin is 11.9, with hematocrit of 35.2, RDW is 14.7. PT on admission 19.4, with an INR of 1.7. Today PT is 25.2, with an INR of 2.4. EKG revealed normal sinus rhythm.   IMPRESSION:  Persistent nausea and vomiting. Abnormal abdominal x-ray on prior admission was concerning for possible development of acute cholecystitis. History of deep vein thrombosis, pulmonary embolism, requiring chronic anticoagulant therapy. Recently diagnosed with pneumonia.   PLAN: The patient's presentation was discussed with Dr. Verdie Shire. I did discuss with patient the consideration of proceeding forward with an upper endoscopy tomorrow, but the patient will remain on warfarin, thus putting him at increased risk for bleeding. I reviewed numerous times the risks versus benefits of  proceeding forward on anticoagulant therapy. Risks also inclusive of perforation, infection. Procedure discussed in detail. Advised that patient would just allow direct luminal evaluation without any form of intervention such as biopsies being taken at that time. The patient wishes to speak with Dr. Candace Cruise first, and recommendation as well as care plan will present in Dr. Myrna Blazer note.   These services provided by Ebony Cargo, MS, APRN, [facility], FNP under collaborative agreement with Dr. Verdie Shire.   ____________________________ Payton Emerald, NP dsh:mr D: 03/09/2014 16:24:04 ET T: 03/09/2014 19:36:34 ET JOB#: [id number]  cc: Payton Emerald, NP, <Dictator> Payton Emerald MD ELECTRONICALLY SIGNED 03/13/2014 16:10

## 2015-04-14 NOTE — Consult Note (Signed)
Brief Consult Note: Diagnosis: nausea and vomiting.   Patient was seen by consultant.   Consult note dictated.   Recommend to proceed with surgery or procedure.   Orders entered.   Comments: Please see full GI consutl @#[id number].  Patient readmitted with recurrent n/v of uncertain etiology.  Patietn with CT showing abnormality in the upper stomach as well as biliary sludge.  Will arrange for egd tomorrow.  INR will need to be less than 1.3-1.5 for proceedure.  I have discussed the risks benefits and complications of egd to include not limited to bleeding infection perforation and sedation and he wishes to proceed.  Further recs to follow.  Electronic Signatures: Loistine Simas (MD)  (Signed 23-Mar-15 23:14)  Authored: Brief Consult Note   Last Updated: 23-Mar-15 23:14 by Loistine Simas (MD)

## 2015-04-14 NOTE — Consult Note (Signed)
Chief Complaint:  Subjective/Chief Complaint NO ACUTE COMPLAINTS   VITAL SIGNS/ANCILLARY NOTES: **Vital Signs.:   09-Apr-15 13:29  Vital Signs Type Routine  Temperature Temperature (F) 98.1  Celsius 36.7  Temperature Source oral  Pulse Pulse 86  Respirations Respirations 18  Systolic BP Systolic BP 142  Diastolic BP (mmHg) Diastolic BP (mmHg) 77  Mean BP 103  Pulse Ox % Pulse Ox % 96  Pulse Ox Activity Level  At rest  Oxygen Delivery Room Air/ 21 %   Brief Assessment:  GEN no acute distress   Respiratory normal resp effort   Gastrointestinal details normal Nontender   Additional Physical Exam neuro grossly non focal   Lab Results: Routine Chem:  09-Apr-15 06:10   Glucose, Serum 86  BUN  23  Creatinine (comp)  2.33  Sodium, Serum 139  Potassium, Serum 4.0  Chloride, Serum 107  CO2, Serum 25  Calcium (Total), Serum  7.9  Anion Gap 7  Osmolality (calc) 281  eGFR (African American)  36  eGFR (Non-African American)  31 (eGFR values <83m/min/1.73 m2 may be an indication of chronic kidney disease (CKD). Calculated eGFR is useful in patients with stable renal function. The eGFR calculation will not be reliable in acutely ill patients when serum creatinine is changing rapidly. It is not useful in  patients on dialysis. The eGFR calculation may not be applicable to patients at the low and high extremes of body sizes, pregnant women, and vegetarians.)  Routine Coag:  09-Apr-15 06:10   Activated PTT (APTT)  95.6 (A HCT value >55% may artifactually increase the APTT. In one study, the increase was an average of 19%. Reference: "Effect on Routine and Special Coagulation Testing Values of Citrate Anticoagulant Adjustment in Patients with High HCT Values." American Journal of Clinical Pathology 2006;126:400-405.)  Prothrombin  16.3  INR 1.3 (INR reference interval applies to patients on anticoagulant therapy. A single INR therapeutic range for coumarins is not optimal  for all indications; however, the suggested range for most indications is 2.0 - 3.0. Exceptions to the INR Reference Range may include: Prosthetic heart valves, acute myocardial infarction, prevention of myocardial infarction, and combinations of aspirin and anticoagulant. The need for a higher or lower target INR must be assessed individually. Reference: The Pharmacology and Management of the Vitamin K  antagonists: the seventh ACCP Conference on Antithrombotic and Thrombolytic Therapy. CLTRVU.0233Sept:126 (3suppl): [id number] A HCT value >55% may artifactually increase the PT.  In one study,  the increase was an average of 25%. Reference:  "Effect on Routine and Special Coagulation Testing Values of Citrate Anticoagulant Adjustment in Patients with High HCT Values." American Journal of Clinical Pathology 2006;126:400-405.)   Assessment/Plan:  Assessment/Plan:  Assessment SEE EARLIER NOTE, NEPHRITIS, LIKELY LUPUS, STABLE PANCYTOPENIA, NEUTROPHILS NORMAL, INR CONTINUING TO NORMALIZE, NO BLEEDING, NO ACUTE COMPLAINTS.  ON 3/8 DISCUSSED RENAL BX WITH NEPHROLOGY. PLANNED FOR FRIDAY 4/10. ALSO DISCUSSED POSSIBLE  RENAL VEIN U/S. FEELS GOOD. HAD SOME ABDOMINAL SPASMS EARLIER TODAY, BUT GOOD APPETTITE NO VOMITING NO DIARRHEA CURRENTLY NO ABDO PAIN. INR 1.3. NOTE LAST COUMADIN SUNDAY 4/5 AND PO VIT K TUES 4/7   Plan HEPARIN DRIP,  PLANS TO D/C AT LEAST 6 HRS PRIOR TO RENAL BX,  THEN AS PER NEPHROLOGY NO ANTICOAGULATION FOR UP TO 7 DAYS, THEN RESTART COUMADIN WITH BRIDGE LOVENOX..[name] KitchenATER RECONSULT GI. WILL DISCUSS WITH DR [facility], CURRENTLY MAY BE PLANNED FOR APPROX 10 AM, THEREFORE WOULD D/C HEPARIN DRIP NO LATER THAN 4AM, WOULD BE PRUDENT  TO STOP AT 2AM, CHECK PT AND PTT IN AM   Electronic Signatures: Dallas Schimke (MD)  (Signed 09-Apr-15 17:53)  Authored: Chief Complaint, VITAL SIGNS/ANCILLARY NOTES, Brief Assessment, Lab Results, Assessment/Plan   Last Updated: 09-Apr-15 17:53 by  Dallas Schimke (MD)

## 2015-04-14 NOTE — H&P (Signed)
PATIENT NAME:  Jonathon, Snow MR#:  [id number] DATE OF BIRTH:  Dec 03, 1961  DATE OF ADMISSION:  03/08/2014  PRIMARY PHYSICIAN:  Dr. Jacqualine Code   ER PHYSICIAN:  Dr. Benjaman Lobe   CHIEF COMPLAINT:  Intractable nausea and vomiting.   HISTORY OF PRESENT ILLNESS: A 54 year old male patient who was discharged from the hospital on the 16th, comes back today because of nausea and vomiting, unable to keep anything down since discharge. The patient unable to take all medications that were given at the time of discharge which were antibiotics and also Coumadin. Denies any abdominal pain. Does have nausea and vomiting. Denies diarrhea. The patient has no fever.  PAST MEDICAL HISTORY: Significant for admission in July for PE and DVT, and a recent admission for pneumonia. The patient recently also had ankle fracture. Has a cane for that now, and sees [place].   SURGICAL HISTORY: Significant for right ankle surgery and IVC filter.   MEDICATIONS:  Include Coumadin 5 mg p.o. daily, Tramadol 50 mg p.o. daily, Flonase 50 mcg p.o. daily, MiraLAX as needed for constipation, Augmentin 875 mg p.o. b.i.d. for 7 days, azithromycin 500 mg for 7 days.   ALLERGIES: VICODIN.   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   FAMILY HISTORY: Significant for coronary artery disease.   REVIEW OF SYSTEMS:  GENERAL: The patient denies any fever. Does feel very weak because of nausea, vomiting, and unable to keep anything down.  EYES: No blurred vision.  ENT: No tinnitus. No epistaxis. No difficulty swallowing.  RESPIRATORY: No cough. No wheezing.  CARDIOVASCULAR: No chest pain, no orthopnea, no PND.  GASTROINTESTINAL: Has nausea, vomiting, which is going on since March 12. GENITOURINARY:  No dysuria.  ENDOCRINE: No polyuria or nocturia.  HEMATOLOGIC: No anemia or easy bruising.  INTEGUMENT:  Skin is dry, but no skin rashes.  MUSCULOSKELETAL: Has ankle  fracture recently, and has ankle boot for that. He also ambulates with a walker.  NEUROLOGIC: The patient has no history of TIA or seizures.  PSYCHIATRIC:  No anxiety or depression.  PHYSICAL EXAMINATION: VITAL SIGNS: Temperature 97.8, heart rate is 90, blood pressure 122/90, sats 98% on room air. GENERAL: An alert, awake, oriented, 54 year old male in slight distress because he is so dehydrated and nauseous.  HEAD:  Normocephalic, atraumatic.  EYES: Pupils equally reacting to light. Extraocular movements intact.  ENT: No tympanic membrane congestion. No turbinate hypertrophy. No oropharyngeal erythema.  NECK: Supple. No JVD or carotid bruit.  CARDIOVASCULAR: S1, S2 regular, no murmurs. PMI not displaced. Peripheral pulses are intact. No peripheral edema.  RESPIRATORY: Clear to auscultation. No wheeze or rales. GASTROINTESTINAL: The patient has left upper quadrant tenderness present, but no right upper quadrant tenderness. No epigastric tenderness. Bowel sounds present in all quadrants. No hernias.  SKIN:  Turgor is decreased. No diaphoresis.  NEUROLOGIC: Cranial nerves II through XII intact. Power 5/5 in upper and lower extremities. Sensation intact. DTRs 2+ bilaterally.  PSYCHIATRIC: Mood and affect are within normal limits.   LABORATORY DATA: Ultrasound of the legs shows no evidence of DVT in the right leg. Troponin less than 0.02. Chest x-ray shows increased bibasilar densities, consider area of atelectasis versus infiltrate, bilateral effusions. Small, interstitial findings represent (> chronic interstitial fibrotic changes.   WBC 5, hemoglobin 11.9, hematocrit 38.2, platelets 162.  Electrolytes: Sodium is 132, potassium 3.8, chloride 101, bicarb 25, BUN 15, creatinine 1.6, glucose 83. INR 1.6. The patient's creatinine on  March 15 was 1.57.   The patient also had a HIDA scan on March 13 which was normal. The patient's CT angio was done on March 12 which showed negative for pulmonary emboli.  EKG shows normal sinus rhythm with 78 beats per minute,  no ST-T changes.   ASSESSMENT AND PLAN:   1.  This patient is a 54 year old male patient with intractable nausea, vomiting going on for about 7 days. The patient felt like he continued to have nausea, vomiting and he was discharged last time. The patient will be admitted to hospitalist service for intractable nausea, vomiting. The patient will be getting GI consult for possibleluminal  evaluation. Continue Protonix 40 mg b.i.d. Continue IV fluids.   2. The patient has recently treated pneumonia, but chest x-ray still has some residual inflammation, so continue Rocephin and Zithromax.   3. History of pulmonary embolism. The patient on Coumadin. The patient's INR is not therapeutic. Will continue Coumadin according  to  INR. The patient's recent CT did not reveal any  pulmonary emboli.   4.  Acute-on-chronic renal failure secondary to dehydration with nausea, vomiting. Continue IV fluids. Monitor kidney function closely.   5.  Possible underlying depression /anxiety. The patient can be started on small dose of Xanax at 0.25 mg b.i.d. The patient is worried about his health, with multiple hospitalizations recently with nausea and vomiting.   Time spent is about 60 minutes.    ____________________________ Epifanio Lesches, MD sk:mr D: 03/08/2014 19:31:00 ET T: 03/08/2014 20:11:50 ET JOB#: [id number]  cc: Epifanio Lesches, MD, <Dictator> Epifanio Lesches MD ELECTRONICALLY SIGNED 04/01/2014 0:14

## 2015-04-14 NOTE — Discharge Summary (Signed)
PATIENT NAME:  Jonathon, Snow MR#:  [id number] DATE OF BIRTH:  [birth year]  DATE OF ADMISSION:  03/08/2014 DATE OF DISCHARGE:  03/10/2014  DISCHARGE DIAGNOSES: 1.  Biliary dysfunction with nausea, vomiting, abdominal pain.  2.  Dehydration.  3.  Chronic kidney disease, stage III.  4.  History of pulmonary embolism, deep vein thrombosis, with inferior vena cava filter, on Coumadin.  5.  Left lower lobe pneumonia.  6.  Hypoalbuminemia.  7.  Mild pancytopenia.   CONSULTATIONS:  1.  Dr. Burt Knack with surgery.  2.  Dr. Candace Cruise with GI.  3.  Dr. Humphrey Rolls with cardiology.   IMAGING STUDIES DONE: Include:  1.  Chest PA and lateral showed small bilateral pleural effusions with bibasilar densities.  2.  Venous Doppler of lower extremities showed no DVT.  3.  Ultrasound of the abdomen showed no cholecystitis, did show postsurgical changes from prior liver cyst removal.   ADMITTING HISTORY AND PHYSICAL: Please see detailed H and P dictated by Dr. Vianne Bulls on 03/08/2014. In brief, a 54 year old male patient who was recently in the hospital for left lower lobe pneumonia, nausea, vomiting, abdominal pain, returned to the Emergency Room complaining of intractable nausea, vomiting, abdominal pain. The patient was admitted for further management.   HOSPITAL COURSE: 1.  Nausea, vomiting, abdominal pain. This was thought to be secondary to gallbladder dysfunction. Ultrasound was done, which showed no cholecystitis. The patient was recommended cholecystectomy, but the patient is still unable to decide if he wants to go through surgery for cholecystectomy. He will follow up with surgery, Dr. Burt Knack, as outpatient for an outpatient elective cholecystectomy. The patient is also presently on Coumadin. His INR is therapeutic and will need to hold his Coumadin prior to surgery. By the day of discharge, the patient's nausea, vomiting, abdominal pain have resolved, but there is a high chance that his symptoms will likely be  recurrent without cholecystectomy. The patient was also seen by cardiology with his left-sided chest pain, and his pain was thought to be noncardiac with normal EKG, normal cardiac enzymes, and noncardiac symptoms. The patient was cleared for any surgery. The patient was seen by GI, who suggested EGD, and the patient will follow up with Dr. Candace Cruise as outpatient.  2.  Left lower lobe pneumonia. The patient was continued on antibiotics IV and will be discharged home on Levaquin p.o. at discharge.  3.  The patient had mild pancytopenia. Will follow up with his primary care physician for repeat CBC.  4.  History of DVT, PE. Continue Coumadin.   Prior to discharge, the patient's lungs sound clear. Abdomen is nontender. The patient is ambulating well. Alert and oriented x 3.   DISCHARGE MEDICATIONS: 1.  Flonase 1 spray nasal once a day.  2.  MiraLax 17 grams oral once a day as needed.  3.  Warfarin 5 mg oral once a day.  4.  Tramadol 50 mg oral 3 times a day as needed for pain.  5.  Zofran 4 mg oral 4 times a day as needed for nausea, vomiting. 6.  Levaquin 250 mg oral once a day.   DISCHARGE INSTRUCTIONS: Low-fat, low-cholesterol diet. Activity as tolerated. Follow up with primary care physician and Dr. Burt Knack in 1 to 2 weeks.   Time spent on day of discharge in discharge activity was 40 minutes.  ____________________________ Leia Alf Collins Kerby, MD srs:jcm D: 03/10/2014 16:21:32 ET T: 03/10/2014 23:08:50 ET JOB#: [id number]  cc: Alveta Heimlich R. Vercie Pokorny, MD, <Dictator> Neita Carp MD  ELECTRONICALLY SIGNED 03/18/2014 10:28
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
## 2015-04-14 NOTE — Discharge Summary (Signed)
PATIENT NAME:  Jonathon Snow, Jonathon Snow MR#:  [id number] DATE OF BIRTH:  13-Nov-1961  DATE OF ADMISSION:  03/02/2014 DATE OF DISCHARGE:  03/06/2014  CHIEF COMPLAINT: Left upper quadrant pain, right upper quadrant pain, nausea and vomiting for 2 to 3 days.   DISCHARGE DIAGNOSES: 1.  Abdominal pain likely secondary to community-acquired pneumonia, referred pain. 2.  Community-acquired pneumonia.  3.  History of pulmonary embolus and deep vein thrombosis.  4.  Supratherapeutic INR.   DISCHARGE MEDICATIONS:  1.  Warfarin 5 mg once a day. 2.  Tramadol 50 mg once a day. 3.  Flonase 50 mcg once a day.  4.  Polyethylene glycol 3350 mg as needed for constipation. 5.  Amoxicillin with clavulanic 875 mg 2 times a day for 7 days. 6.  Azithromycin 500 mg once a day for 7 days.  DISCHARGE INSTRUCTIONS: Follow up with Dr. Debbora Dus in 1 to 2 days and follow up with Dr. Inez Pilgrim in 1 or 2 days to check INR.  IMPORTANT RESULTS: The patient had an ultrasound of the abdomen showing no visible gallstones and mild wall thickening. Cannot exclude early acute cholecystitis. A 2.5 cm sludge ball or polyp. Recommend followup in 6 months.  HIDA scan: Normal hepatobiliary examination.  CT scan of the chest to rule out PE shows negative for embolism, stable lymph nodes in the axilla and mediastinum. Multilocular cyst mass in the right lobe of the liver. Basilar atelectasis.   Chest x-ray: Small area of consolidation on the left base, subsegmental atelectasis of the lung.  CONSULTANTS: Dr. Molly Maduro. He was consulted for abdominal pain, possible cholecystitis. Cholecystitis was ruled out as the patient had a HIDA scan that did not show any major abnormalities.   HOSPITAL COURSE: This is a very nice 54 year old gentleman with history of pulmonary embolism status post IVC filter in July of 2014. He had failure of anticoagulation. Presented with 2 to 3 days of right upper quadrant pain, nausea, vomiting, and left upper  quadrant pain similar to that when he had a pulmonary embolism. The patient was examined in the Emergency Department. Dr. Leanora Cover did not think that he had cholecystitis. HIDA scan was done to prove it and the diagnosis was ruled out. As far as the left upper quadrant pain, it seems to be related to pneumonia. The patient was leukopenic and started getting treated with Rocephin and azithromycin.   As far as his pneumonia goes, the patient had a good evaluation. No significant fever. He was leukopenic. He was treated with Rocephin and azithromycin and started to he improved quickly.   As far as his right upper quadrant pain, the patient continued to have mild pain, which improved through the days of the admission and this was likely related to referred pain from the consolidation. HIDA scan was negative. The patient had some gastritis for what he was put on PPI. Recommendations given at discharge.   The patient also was very constipated for what enema was given prior to being discharged and that alleviated most of his symptoms.  As far as his PE and DVT, the patient has an IVC filter. His INR was supratherapeutic. At admission was 3.9 and it was up to 5.5, at discharge was 1.6 and the patient was restarted back on his dose of Coumadin at 5 mg.   Because of his leukopenia, the patient had a HIV test. It was negative.   His urinalysis showed small blood, but as the patient is on Coumadin, it was  supratherapeutic, we just recommend follow-up with a urinalysis within the next couple of months.   His leukopenia improved. Discharge white blood count was 3.3. He will continue to follow up for this with Dr. Inez Pilgrim.   He had chronic kidney disease with a GFR around 55 to 60 and it remained stable.   The patient is discharged in good condition. Follow up with Dr. Debbora Dus and Dr. Inez Pilgrim.  TIME SPENT ON DISCHARGE: About 45 minutes.  ____________________________ Campbell Sink,  MD rsg:sb D: 03/07/2014 06:57:59 ET T: 03/07/2014 10:48:18 ET JOB#: [id number]  cc: [place] Sink, MD, <Dictator> Milinda Pointer. Jacqualine Code, MD Simonne Come. Inez Pilgrim, MD Cristi Loron MD ELECTRONICALLY SIGNED 03/19/2014 13:25
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
## 2015-04-14 NOTE — Consult Note (Signed)
PATIENT NAME:  Jonathon Snow, Jonathon Snow MR#:  [id number] DATE OF BIRTH:  [birth year]  DATE OF CONSULTATION:  03/09/2014  CONSULTING PHYSICIAN:  Jerrol Banana. Burt Knack, MD  CHIEF COMPLAINT: Nausea, vomiting and abdominal pain.   HISTORY OF PRESENT ILLNESS: This is a patient with approximately 10 days of what he calls intractable nausea, vomiting and abdominal pain, but on further discussion, he has vomited approximately 3 times a day for the last 10 days. He has not vomited since being in the hospital; therefore, he has not vomited for 18 to 24 hours and is having minimal nausea. He points to the epigastrium, right upper quadrant and to his left chest as the areas of maximal pain. He denies any back pains. No fevers or chills. No melena or hematochezia. No dark urine and has had multiple episodes in the past, but never to this severity; in fact, he states it has been happening approximately every 2 weeks for the last several months where he has had right upper quadrant pain and some left chest pain. He does not relate it to fatty foods.   Of note, the patient is anticoagulated for prior DVT.   PAST MEDICAL HISTORY: DVT and PE in June or July 2014. Recent ankle fracture repair in December 2014. He has had hypertension in the past, but has never been medically treated for it; managing it with diet and exercise. He does have a [place] filter in place that was placed back in June or July of last year, around the time of the discovery of his DVT, PE.   PAST SURGICAL HISTORY: Right ankle surgery and filter placement.   MEDICATIONS: Include Coumadin. Others in chart, please note.   ALLERGIES: VICODIN, CAUSING A RASH.   SOCIAL HISTORY: The patient manages a [employer]. He does not smoke nor does he drink to excess.   FAMILY HISTORY: Significant for multiple family members who have either had documented DVT, PEs or are on Coumadin for other unknown reasons.   REVIEW OF SYSTEMS: Ten-system review is  performed and negative with the exception of that mentioned in the HPI.   PHYSICAL EXAMINATION: GENERAL: Somewhat uncomfortable-appearing male patient. BMI is 27.  VITAL SIGNS: Temperature of 97.2, pulse of 62, respirations 18, blood pressure 118/69.  HEENT: Shows no scleral icterus.  NECK: No palpable neck nodes.  CHEST: Clear to auscultation.  CARDIAC: Regular rate and rhythm.  ABDOMEN: Soft. He seems to be tender diffusely throughout the abdomen, possibly maximally in the right upper quadrant without a Murphy sign. No hernias are noted.  EXTREMITIES: Without edema.  NEUROLOGIC: Grossly intact.  INTEGUMENT: No jaundice.  EXTREMITIES: Calves are nontender.   LABORATORY VALUES: Demonstrate an INR of 2.4, a creatinine of 1.5; other electrolytes within normal limits. White blood cell count yesterday was 5.0; it has not been rechecked. H and H of 11.9 and 35.2 yesterday. The last time liver function tests were available were on the 13th of March, at which time he had normal LFTs with a serum albumin depressed at 2.7. On that day, his INR was 5.5.   He had an ultrasound on the 12th of March of the right upper quadrant, which showed no visible gallstones. There was some minimal gallbladder wall thickening and tenderness and a possible 1 cm "sludge ball" or polyp.   ASSESSMENT AND PLAN: This is a patient with a constellation of symptoms, some of which could be related to the gallbladder. He had a HIDA scan as well that was normal  and, in fact, hyperfunctioning with an ejection fraction of 84%. He was having pain throughout the test; both prior to and after the CCK injection, according to the report. With this in mind, while he may have gallbladder disease and that "sludge ball" may be responsible for this, he is anticoagulated and needs further workup prior to any thought of performing surgery for his gallbladder. The unexplained left chest pain needs to be addressed as well. As he has not had any liver  function tests checked recently, I will repeat those and consider repeating an ultrasound to determine if he truly has sludge that could be causing this gallbladder problem and the resultant nausea and vomiting (which has resolved) that may be related to his gallbladder.    ____________________________ Jerrol Banana. Burt Knack, MD rec:dmm D: 03/09/2014 12:34:00 ET T: 03/09/2014 12:58:23 ET JOB#: [id number]  cc: Jerrol Banana. Burt Knack, MD, <Dictator> Florene Glen MD ELECTRONICALLY SIGNED 03/09/2014 17:41

## 2015-04-14 NOTE — Consult Note (Signed)
Chief Complaint:  Subjective/Chief Complaint NO ACUTE COMPLAINTS   VITAL SIGNS/ANCILLARY NOTES: **Vital Signs.:   10-Apr-15 14:30  Vital Signs Type Post-Procedure  Temperature Temperature (F) 98.2  Celsius 36.7  Pulse Pulse 98  Respirations Respirations 18  Systolic BP Systolic BP 751  Diastolic BP (mmHg) Diastolic BP (mmHg) 60  Mean BP 74  Pulse Ox % Pulse Ox % 98  Pulse Ox Activity Level  At rest  Oxygen Delivery Room Air/ 21 %   Brief Assessment:  GEN no acute distress   Respiratory normal resp effort   Gastrointestinal details normal Nontender   Additional Physical Exam neuro grossly non focal   Lab Results: Hepatic:  10-Apr-15 00:30   Bilirubin, Total 0.3  Alkaline Phosphatase 77 (45-117 NOTE: New Reference Range 11/11/13)  SGPT (ALT) 19  SGOT (AST) 26  Total Protein, Serum 6.7  Albumin, Serum  2.8  Routine BB:  10-Apr-15 00:30   ABO Group + Rh Type A Positive  Antibody Screen NEGATIVE (Result(s) reported on 31 Mar 2014 at 05:13AM.)  Routine Chem:  10-Apr-15 00:30   Glucose, Serum 87  BUN  28  Creatinine (comp)  2.24  Sodium, Serum  147  Potassium, Serum 4.1  Chloride, Serum 106  CO2, Serum 24  Calcium (Total), Serum  7.8  Osmolality (calc) 297  eGFR (African American)  38  eGFR (Non-African American)  32 (eGFR values <42m/min/1.73 m2 may be an indication of chronic kidney disease (CKD). Calculated eGFR is useful in patients with stable renal function. The eGFR calculation will not be reliable in acutely ill patients when serum creatinine is changing rapidly. It is not useful in  patients on dialysis. The eGFR calculation may not be applicable to patients at the low and high extremes of body sizes, pregnant women, and vegetarians.)  Anion Gap  17  Misc Urine Chem:  10-Apr-15 09:39   Creatinine, Urine 95.4  Protein, Random Urine  51  Protein/Creat Ratio (comp)  535 (Result(s) reported on 31 Mar 2014 at 11:07AM.)  Routine UA:  10-Apr-15 09:39    Color (UA) Yellow  Clarity (UA) Hazy  Glucose (UA) Negative  Bilirubin (UA) Negative  Ketones (UA) Negative  Specific Gravity (UA) 1.010  Blood (UA) 3+  pH (UA) 5.0  Protein (UA) 30 mg/dL  Nitrite (UA) Negative  Leukocyte Esterase (UA) Negative (Result(s) reported on 31 Mar 2014 at 10:17AM.)  RBC (UA) 174 /HPF  WBC (UA) 2 /HPF  Bacteria (UA) TRACE  Epithelial Cells (UA) <1 /HPF  Mucous (UA) PRESENT  Budding Yeast (UA) PRESENT (Result(s) reported on 31 Mar 2014 at 10:17AM.)    14:16   Color (UA) Straw  Clarity (UA) Clear  Glucose (UA) Negative  Bilirubin (UA) Negative  Ketones (UA) Negative  Specific Gravity (UA) 1.008  Blood (UA) 3+  pH (UA) 8.0  Protein (UA) Negative  Nitrite (UA) Negative  Leukocyte Esterase (UA) Negative (Result(s) reported on 31 Mar 2014 at 02:39PM.)  RBC (UA) 235 /HPF  WBC (UA) 2 /HPF  Bacteria (UA) NONE SEEN  Epithelial Cells (UA) NONE SEEN  Mucous (UA) PRESENT (Result(s) reported on 31 Mar 2014 at 02:39PM.)    18:15   Color (UA) Yellow  Clarity (UA) Clear  Glucose (UA) Negative  Bilirubin (UA) Negative  Ketones (UA) Negative  Specific Gravity (UA) 1.009  Blood (UA) 3+  pH (UA) 7.0  Protein (UA) Negative  Nitrite (UA) Negative  Leukocyte Esterase (UA) Negative (Result(s) reported on 31 Mar 2014 at 06:36PM.)  RBC (UA)  176 /HPF  WBC (UA) 3 /HPF  Bacteria (UA) NONE SEEN  Epithelial Cells (UA) NONE SEEN  Mucous (UA) PRESENT (Result(s) reported on 31 Mar 2014 at 06:36PM.)  Routine Coag:  10-Apr-15 05:13   Activated PTT (APTT) 30.9 (A HCT value >55% may artifactually increase the APTT. In one study, the increase was an average of 19%. Reference: "Effect on Routine and Special Coagulation Testing Values of Citrate Anticoagulant Adjustment in Patients with High HCT Values." American Journal of Clinical Pathology 2006;126:400-405.)  Prothrombin  15.3  INR 1.2 (INR reference interval applies to patients on anticoagulant therapy. A single INR  therapeutic range for coumarins is not optimal for all indications; however, the suggested range for most indications is 2.0 - 3.0. Exceptions to the INR Reference Range may include: Prosthetic heart valves, acute myocardial infarction, prevention of myocardial infarction, and combinations of aspirin and anticoagulant. The need for a higher or lower target INR must be assessed individually. Reference: The Pharmacology and Management of the Vitamin K  antagonists: the seventh ACCP Conference on Antithrombotic and Thrombolytic Therapy. GUYQI.3474 Sept:126 (3suppl): [id number]. A HCT value >55% may artifactually increase the PT.  In one study,  the increase was an average of 25%. Reference:  "Effect on Routine and Special Coagulation Testing Values of Citrate Anticoagulant Adjustment in Patients with High HCT Values." American Journal of Clinical Pathology 2006;126:400-405.)  Routine Hem:  10-Apr-15 05:13   Hemoglobin (CBC)  9.3 (Result(s) reported on 31 Mar 2014 at 05:38AM.)  Platelet Count (CBC)  131 (Result(s) reported on 31 Mar 2014 at 05:38AM.)    16:14   Hemoglobin (CBC)  9.7 (Result(s) reported on 31 Mar 2014 at 04:34PM.)   Radiology Results: [place]:    10-Apr-15 12:34, US Guide Needle Biopsy Other Than Breast  US Guide Needle Biopsy Other Than Breast   REASON FOR EXAM:    hematuria  COMMENTS:       PROCEDURE: [place]  - US GUIDED BX/ASPIRATION NOT BR  - Mar 31 2014 12:34PM     CLINICAL DATA:  Renal disease.    EXAM:  Ultrasound kidneys for guidance of biopsy.    TECHNIQUE:  Real-time ultrasonography wasperformed for renal biopsy by  nephrology.    COMPARISON:  None.  FINDINGS:  Left renal ultrasound was successfully performed for biopsy guidance  by nephrology. No post biopsy complicating features are noted.     IMPRESSION:  Successful study.      Electronically Signed    By: [name]  Register    On: 03/31/2014 15:03         Verified By: Osa Craver, M.D., MD    Assessment/Plan:  Assessment/Plan:  Assessment SEE EARLIER NOTE, NEPHRITIS, LIKELY LUPUS, STABLE PANCYTOPENIA, today cre slightly better, hgb stable. bx done. some nausea post bx, was not having pain when i saw him   Plan discussed with dr singh.  ok for anticoagulation at approx 72 hrs, so planning for monday, resume lovenox, and coumadin, f/u cre, adjust lovenox prn, if cre stable, based on prior dose and anti xa levels would be good on 51m q 12h initially, plus same coumadin as prior   Electronic Signatures: GDallas Schimke(MD)  (Signed 10-Apr-15 23:02)  Authored: Chief Complaint, VITAL SIGNS/ANCILLARY NOTES, Brief Assessment, Lab Results, Radiology Results, Assessment/Plan   Last Updated: 10-Apr-15 23:02 by GDallas Schimke(MD)

## 2015-04-14 NOTE — Consult Note (Signed)
Records reviewed, patient interviewed and examined.  Full consult to be dictated in morning.  will check hemocults, consider colonoscopy but timing per Dr. Cynda Acres.    Electronic Signatures: Manya Silvas (MD)  (Signed on 30-Mar-15 20:12)  Authored  Last Updated: 30-Mar-15 20:12 by Manya Silvas (MD)

## 2015-04-14 NOTE — H&P (Signed)
PATIENT NAME:  Jonathon Snow, Jonathon Snow MR#:  [id number] DATE OF BIRTH:  [birth year]  DATE OF ADMISSION:  03/12/2014  PRIMARY CARE PHYSICIAN:  Dr. Jacqualine Code.   REFERRING PHYSICIAN:  Dr. Beather Arbour.   CHIEF COMPLAINT:  Nausea, vomiting.   HISTORY OF PRESENT ILLNESS:  Jonathon Snow is a 54 year old male who has been frequently getting admitted for intractable nausea and vomiting multiple times who has been discharged two days back after being treated for nausea and vomiting, which was thought to be from biliary dysfunction.  The patient had work-up done.  No signs of any cholecystitis, however the patient was recommended to follow up with surgery as an outpatient for cholecystectomy.  The patient states the patient having this nausea and vomiting for the last two weeks.  Denies having any sick contacts.  Having regular bowel movements.  Vomiting only clear liquid.  Denies having any fever.  Denies having any abdominal pain.  The patient has normal LFTs in the Emergency Department.  CT abdomen and pelvis done in the Emergency Department shows mural-based polypoid opacity within the proximal stomach near the GE junction.  The patient received 8 mg of Zofran and Phenergan without much improvement.  Concerning this, the decision is made to admit the patient.  The patient states the patient's symptoms started soon after going home.  Since then has been having multiple episodes of vomiting.  The patient has elevated creatinine of 1.66, however no elevated BUN.  No other electrolyte abnormalities.   PAST MEDICAL HISTORY: 1.  Pulmonary embolism diagnosed in July 2014.  2.  Herniated disk.  3.  Right ankle fracture status post open reduction and internal fixation.  4.  IVC filter placement.  5.  Cyst removed from the liver.   PAST SURGICAL HISTORY:  1.  Right ankle surgery.  2.  IVC filter placement.   ALLERGIES:  VICODIN.  HOME MEDICATIONS:  1.  Coumadin 5 mg once a day.  2.  Tramadol 50 mg 3 times a day.  3.   Polyethylene glycol 17 grams once a day.  4.  Zofran 4 mg 4 times a day.  5.  Levaquin 250 mg once a day.  6.  Flonase 50 mcg once a day.   SOCIAL HISTORY:  No history of smoking, drinking alcohol or using illicit drugs.  Lives by himself.  Works as a Scientist, clinical (histocompatibility and immunogenetics).   FAMILY HISTORY:  Significant for coronary artery disease.   REVIEW OF SYSTEMS: CONSTITUTIONAL:  Generalized weakness.  EYES:  No change in vision.  EARS, NOSE, THROAT:  No change in hearing. RESPIRATORY:  No cough, shortness of breath.  CARDIOVASCULAR:  No chest pain, palpations. GASTROINTESTINAL:  Has nausea, vomiting.  GENITOURINARY:  No dysuria or hematuria.  ENDOCRINE:  No polyuria or polydipsia.  HEMATOLOGIC:  No bruising or bleeding.  SKIN:  No rash or lesions.  MUSCULOSKELETAL:  No joint pains and aches.  Has right ankle pain.   PHYSICAL EXAMINATION: GENERAL:  This well-built, well-nourished, age-appropriate male lying down in the bed, not in distress.  VITAL SIGNS:  Temperature 97.5, pulse 82, blood pressure 118/72, respiratory rate of 18, oxygen saturation is 96% on room air.  HEENT:  Head normocephalic, atraumatic.  Eyes, no scleral icterus.  Conjunctivae normal.  Pupils equal and react to light.  Extraocular movements are intact.  Mucous membranes moist.  No pharyngeal erythema.  NECK:  Supple.  No lymphadenopathy.  No JVD.  No bruits.  CHEST:  Has no focal tenderness.  LUNGS:  Bilaterally clear to auscultation.  HEART:  S1, S2 regular.  No murmurs are heard.  ABDOMEN:  Bowel sounds plus.  Soft, nontender, nondistended.  No hepatosplenomegaly.  EXTREMITIES:  No pedal edema.  Pulses 2+.   NEUROLOGIC:  The patient is alert, oriented to place, person and time.  Cranial nerves II through XII intact.  Motor five by five in upper and lower extremities.   LABORATORY DATA:  CMP is completely within normal limits.  CT abdomen and pelvis, questionable polypoid mass in the proximal stomach, hiatal hernia, mildly  prominent external iliac and right inguinal adenopathy, mild bilateral pleural effusions.   CBC is completely within normal limits.  Ultrasound of the right upper quadrant shows no evidence of  of the lower extremities.   ASSESSMENT AND PLAN:  Jonathon Snow is a 54 year old male who comes with intractable nausea and vomiting.  1.  Nausea, vomiting.  Does not seem to be from the gallbladder.  The patient has normal liver function tests.  Does not have any elevated white blood cell count.  Does not have any tenderness in the right upper quadrant.  We will obtain HIDA scan.  If negative, do not think gallbladder is the cause for the nausea, vomiting.  Possibility of gastritis.  Consulting gastroenterology.  The patient may benefit getting an EGD.  Keep the patient nothing by mouth.  Continue with IV fluids.  Continue with antinausea medications.  The patient does not have any bowel obstruction.  The patient has been retching; however, I do not see any vomitus in the bag.  Also, I did not see any signs of dehydration.  The patient's BUN is 10, even though the patient states that has not been eating and drinking for the last two days.  2.  History of deep vein thrombosis, pulmonary embolus.  Continue with Coumadin.   TIME SPENT:  50 minutes.   ____________________________ Monica Becton, MD pv:ea D: 03/12/2014 01:36:08 ET T: 03/12/2014 05:00:53 ET JOB#: [id number]  cc: Monica Becton, MD, <Dictator> Milinda Pointer. Jacqualine Code, MD Grier Mitts Paradise Vensel MD ELECTRONICALLY SIGNED 04/06/2014 0:15

## 2015-04-14 NOTE — Consult Note (Signed)
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   Patient was seen by consultant.   Discussed with Attending MD.   Comments: DICTATED NOTE TO [place] DR [place]. HX AS ABOVE OTHER RECENT ISSUES GI SYMPTOMS, EGD DONE, COLONOSCOPY PLANNED. RECENTLY, BEFORE MORE EVIDENCE OF CAUSE OF RENAL FAILURE,  RENAL VEIN U/S PLANNED BUT HAS NOT BEEN PERFORMEED. HAS HAD SOB, WITH CT CHEST NEG FOR PATHOLOGY OR PE, AND ECHO UNREMARKABLE. RENAL BX PLANNED, ALSO WITH NEPHROLOGY RECOMMENDATION THAT POST PROCEEDURE, OFF OF ANTICOAGULATION FOR 7 DAYS DUE TO BLEED RISK, , WITH FILTER THERE IS RISK OF CLOT, SO RESUME ANTICOAGULATION AS SOON AS POSSIBLE.  TODAY GIVEN PO VIT K.  LAST DOSE COUMADIN WAS SUN 4/5 ,.  Electronic Signatures: Dallas Schimke (MD)  (Signed 07-Apr-15 23:13)  Authored: Brief Consult Note   Last Updated: 07-Apr-15 23:13 by Dallas Schimke (MD)

## 2015-04-14 NOTE — Consult Note (Signed)
Brief Consult Note: Patient was seen by consultant.   Consult note dictated.   Comments: 1. 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 urine Mspike 3. nausea ?etiology 4.ankle fracture agree with renal bx and therapy to be determined.Recommend anticardiolipin Abs and Lupus Anticoagulant if not previously measured by Heme.  Electronic Signatures: Leeanne Mannan., Eugene Gavia (MD)  (Signed 07-Apr-15 20:59)  Authored: Brief Consult Note   Last Updated: 07-Apr-15 20:59 by Leeanne Mannan., Eugene Gavia (MD)

## 2015-04-14 NOTE — H&P (Signed)
PATIENT NAME:  Jonathon Snow, Jonathon Snow MR#:  [id number] DATE OF BIRTH:  June 10, 1961  DATE OF ADMISSION:  03/02/2014  PRIMARY CARE PHYSICIAN: Debbora Dus.   CHIEF COMPLAINT: Left upper quadrant and right upper quadrant pain with nausea and vomiting for the past 2 to 3 days.   HISTORY OF PRESENT ILLNESS: This is a very pleasant 54 year old male with a history of DVT, PE, status post an IVC in July 2014 for failure of anticoagulation, who presents with the above complaint. Over the past 2 to 3 days, the patient has had right upper quadrant pain, nausea,  vomiting, as well as left upper quadrant pain. He says his left upper quadrant pain feels similar to when he was here in July with pulmonary emboli. He also says his right upper quadrant pain is crampy in nature and has been associated with some nausea and vomiting. He also feels like it is kind of like a spasm-type pain. He denies any fever or chills. He has not been able to eat for the past day due to nausea and vomiting. The patient was seen by Dr. Leanora Cover while in the ER, as asked by the ER physician due to right upper quadrant abdominal ultrasound showing sludge, as well as possible acute cholecystitis; however, Dr. Leanora Cover did not feel that this was an acute cholecystitis, that he was low-risk, or any gallbladder issue at this time.   REVIEW OF SYSTEMS:  CONSTITUTIONAL:  No fever. Positive fatigue, weakness.  EYES:  No blurred or double vision. No glaucoma or cataracts. ENT:  No ear pain, hearing loss, seasonal allergies or snoring. RESPIRATORY:  No cough, wheezing, hemoptysis, COPD.  Positive history of PE.  CARDIOVASCULAR: No chest pain, orthopnea, edema, arrhythmia, dyspnea on exertion. GASTROINTESTINAL: Positive nausea and vomiting. No diarrhea. Positive right upper quadrant abdominal pain. Positive left upper quadrant abdominal pain. No melena or ulcers.  GENITOURINARY:  No dysuria or hematuria.  ENDOCRINE: No polyuria or polydipsia.  HEMATOLOGIC  AND LYMPHATICS:  No easy bruising or bleeding.  SKIN: No rash or lesions.  MUSCULOSKELETAL: He has not been able to ambulate very much due to a foot fracture. He is just now starting to walk with a walker.  NEUROLOGIC:  No history CVA, TIA or seizures.  PSYCHIATRIC: No history of anxiety or depression.   PAST MEDICAL HISTORY:   1.  DVT and PE. The patient had a PE while on Xarelto therapy for DVT.  He now has an IVC filter.  2.  Serial blood clots.  3.  Herniated disk.   PAST SURGICAL HISTORY: 1.  Right ankle.  2.  IVC filter.  3.  A cyst removed from his liver.   MEDICATIONS: 1.  Coumadin 5 mg daily.  2.  Tramadol 50 mg q.4 hours.  3.  Flonase 1 spray daily.   ALLERGIES: VICODIN CAUSES A RASH.   FAMILY HISTORY: Positive for CAD.   SOCIAL HISTORY: Very occasional alcohol. No tobacco or IV drug use.   PHYSICAL EXAMINATION: VITAL SIGNS: Temperature 97.9. Pulse is 78, respirations 18, blood pressure 123/67, 97 on room air.  GENERAL: The patient is alert, oriented, not in any acute distress. HEENT:  Head is atraumatic. Pupils are round. Sclerae anicteric. Mucous membranes are moist. Oropharynx is clear.  NECK: Supple without JVD, carotid bruit or enlarged thyroid.  CARDIOVASCULAR: Regular rate and rhythm. No murmurs, gallops or rubs.  PMI is not displaced. LUNGS:  He has got crackles at the left side. No wheezing  or rales are  heard. Normal percussion. No egophony.  ABDOMEN: He has got some very minimal tenderness at the right upper quadrant. No left upper quadrant tenderness. No rebound or guarding. Bowel sounds are positive in all quadrants.  EXTREMITIES: He has got a brace on the right foot. Left lower extremity has about 1+ edema which he says is chronic in nature.  NEUROLOGY:  Cranial nerves II through XII are grossly intact. No focal deficits.  SKIN: Without any rash or lesions.   LABORATORIES: White blood cells 3.0, hemoglobin 12.3, hematocrit 35.5. Platelets are 162. Sodium  133, potassium 4.2, chloride 99, bicarb 28, BUN 14, creatinine 1.41, glucose is 83, calcium 9.0. Bilirubin 0.5, alk phos 125, ALT 55, AST 54, total protein 8.5, albumin 3.2. INR is 3.9. Troponin is less than 0.02. Chest x-ray shows left lower lobe infiltrate. Right upper quadrant abdominal ultrasound shows a 1 cm sludge ball or polyp. Recommend follow sonography in 6 months. No visible gallstone to cause obstruction. There is, however, gallbladder tenderness and mild wall thickening; cannot exclude early acute cholecystitis.   ASSESSMENT AND PLAN: A 54 year old male who presented with right upper quadrant abdominal pain, nausea and vomiting, also left upper quadrant pain similar to the pain he had when he developed pulmonary emboli, who, on chest x-ray, is found to have a pneumonia.  1.  Left upper quadrant pain. The patient presented with supratherapeutic INR and IVC filter placed for his PE, and he says that this pain is similar to the time he had pulmonary emboli. He is not hypoxic and not tachycardic; however, given his history and the fact the patient has been immobile, we will go ahead and order CT, which was ordered by the ER physician, to evaluate for a new-onset pulmonary emboli. He also had a pneumonia, as per the chest x-ray, which could be the etiology of his pain as well. We will continue Rocephin and azithromycin as already started by the ER physician and monitor blood cultures.  2.  Leukopenia possibly secondary to problem #1. It is noted on 01/06/2014, he had a white blood cell count of 3, but on January 22nd, his white count was 4.8.  3.  Supratherapeutic INR. We will hold Coumadin at this time.   4.  History of pulmonary emboli, deep vein thrombosis. The patient has an IVC filter, on Coumadin, with a supratherapeutic INR. Concern that there may be a new PE. CT has been ordered. If there is a new PE, we will consult hematology/oncology. Dr. Inez Pilgrim is currently seeing him for his PE and DVT.   He has an IVC filter placed, as he had pulmonary emboli while on Xarelto for his DVT.  The IVC filter had been tried to be removed; however, they were unable to remove it in December 2014.  5.  Acute renal failure. His last creatinine, January 22nd, was 0.93. His creatinine now is 1.41 with a normal GFR. We will provide IV fluids and monitor the creatinine, repeat a BMP in the a.m.  6.  Mild hyponatremia, likely secondary to nausea and vomiting. We will provide IV fluids and repeat a sodium in the morning.  7.  Right upper quadrant pain. The patient was seen by Dr. Leanora Cover. His ultrasound shows possible acute cholecystitis. He has not had any fever or white count; however, I will go ahead and order a HIDA scan to completely rule out acute cholecystitis. The patient is currently on antibiotics for his pneumonia, as well as cholecystitis.   The patient is  a FULL CODE STATUS.    TIME SPENT: Approximately 50 minutes.    ____________________________ Donell Beers. Benjie Karvonen, MD spm:dmm D: 03/02/2014 12:16:54 ET T: 03/02/2014 12:44:07 ET JOB#: [id number]  cc: Alpa Salvo P. Benjie Karvonen, MD, <Dictator> Milinda Pointer. Jacqualine Code, MD Ulice Bold P Dessa Ledee MD ELECTRONICALLY SIGNED 03/02/2014 14:38

## 2015-04-14 NOTE — Consult Note (Signed)
Brief Consult Note: Diagnosis: Nausea/vomiting.   Patient was seen by consultant.   Consult note dictated.   Recommend further assessment or treatment.   Comments: I was seeing pt at prior admission and had talked to pt on phone yesterday. Pt has no abd pain but mult emesis. Cont w/u, CT rev'd. Consider EGD. May require lap chole. Hold coumadin in case pt requires surgery.  Electronic Signatures: Florene Glen (MD)  (Signed 22-Mar-15 08:56)  Authored: Brief Consult Note   Last Updated: 22-Mar-15 08:56 by Florene Glen (MD)

## 2015-04-14 NOTE — Consult Note (Signed)
Details:   - EGD done today.  Findings:   very mild gastritis and duodenitis. Nothing to correlate with gastric changes seen on CT.   Doubt this is source of n/v, pain.   As for this cramping pain he is having in LUQ and around to back,  no etiology seen on CT or EGD.  Likely functional / abd wall pain.   Recs: protonix 40 daily - h pylori serology - trial of carafate if not improved on protonix - lactose avoidance - low fodmap diet   Electronic Signatures: Arther Dames (MD)  (Signed 25-Mar-15 14:20)  Authored: Details   Last Updated: 25-Mar-15 14:20 by Arther Dames (MD)

## 2015-04-14 NOTE — Consult Note (Signed)
PATIENT NAME:  Jonathon Snow, Jonathon Snow MR#:  [id number] DATE OF BIRTH:  August 15, 1961  DATE OF CONSULTATION:  03/09/2014  CARDIOLOGY CONSULTATION  CONSULTING PHYSICIAN:  Dionisio David, MD  INDICATION FOR CONSULTATION: Chest pain.   HISTORY OF PRESENT ILLNESS: This is a 54 year old white male with a past medical history of hypertension came into the hospital with nausea and vomiting. He had intractable nausea. I was asked to evaluate the patient because the patient needed endoscopy and they wanted clearance before doing endoscopy, as he was also having left-sided chest pain.   PAST MEDICAL HISTORY: History of having pulmonary emboli and a DVT in July last year and pneumonia in the past.   SOCIAL HISTORY: Denies EtOH abuse or smoking.   FAMILY HISTORY: Positive for coronary artery disease.   MEDICATIONS: Coumadin, tramadol, Flonase and MiraLAX.   PHYSICAL EXAMINATION: GENERAL: He is alert, oriented x 3, in no acute distress.  VITAL SIGNS: Stable.  HEENT: Revealed no JVD.  LUNGS: Clear.  HEART: Regular rate and rhythm. Normal S1, S2. No audible murmur.  ABDOMEN: Soft, nontender, positive bowel sounds.  EXTREMITIES: No pedal edema.  NEUROLOGIC: The patient appears to be intact.   LABORATORIES AND STUDIES: EKG shows normal sinus rhythm within normal limits. Troponins x 2 are negative.   ASSESSMENT AND PLAN: Atypical chest pain most likely secondary to gastritis or acid reflux. The patient's EKG is unremarkable. Two sets of cardiac enzymes are unremarkable. Advise proceeding with endoscopy. The patient is low risk for this procedure.  Thank you very much for the referral.    ____________________________ Dionisio David, MD sak:ce D: 03/09/2014 17:27:59 ET T: 03/09/2014 18:49:59 ET JOB#: [id number]  cc: Dionisio David, MD, <Dictator> Dionisio David MD ELECTRONICALLY SIGNED 04/05/2014 13:56

## 2015-04-14 NOTE — Consult Note (Signed)
Pt seen and examined. Please see Jonathon Snow's notes. Pt with hx of PE/DVT on coumadin. Has IVC filter. Has chronic upper abd pain. U/S abnormal but HIDA scan neg. Discussed doing EGD with patient. Was willing to do a diagnostic EGD without doing any biopsies on coumadin tomorrow. However, patient prefers to be off coumadin so we can do EGD more safely next week. Will follow. Thanks.  Electronic Signatures: Verdie Shire (MD)  (Signed on 19-Mar-15 16:13)  Authored  Last Updated: 19-Mar-15 16:13 by Verdie Shire (MD)

## 2015-04-14 NOTE — H&P (Signed)
PATIENT NAME:  Jonathon Snow, Jonathon Snow MR#:  [id number] DATE OF BIRTH:  10-Aug-1961  DATE OF ADMISSION:  03/28/2014  PRIMARY CARE PHYSICIAN: Debbora Dus, MD  ONCOLOGIST: Barbette Reichmann, MD  NEPHROLOGIST: Anthonette Legato, MD  CHIEF COMPLAINT: Fatigue and very tired.   HISTORY OF PRESENT ILLNESS: The patient is a 54 year old male with a known history of PE on Coumadin status post IVC filter placement and worsening renal function who is being admitted for acute renal failure. The patient has been following closely with Dr. Holley Raring. 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 Was sent over by Dr. Holley Raring as a direct admit for possible kidney biopsy. This was getting complicated as an outpatient considering he will require bridging through heparin drip and close monitoring considering his blood clot and likely kidney biopsy. He is planned within the next 48 hours to 72 hours. The patient denies any other symptoms other than feeling extreme fatigue. His functions seem to be slowly getting worse. He did have recent lab results concerning for lupus, especially positive ANA along with very high double-stranded DNA titer. He also had a positive RNP antibody and positive SSA antibody and positive antichromatin antibodies.   PAST MEDICAL HISTORY: 1.  Acute renal failure.  2.  PE status post filter placement, on Coumadin.   PAST SURGICAL HISTORY:  1.  Right ankle fracture, status post ORIF.  2.  IVC filter placement.   SOCIAL HISTORY: He is separated and lives alone. He used to work at [employer]. Has not recently worked. He is a social drinker. No history of tobacco or recreational drug use.  FAMILY HISTORY: Positive for kidney disease involving his sibling with a brother on dialysis. Father died at age of 26 due to coronary artery disease.  ALLERGIES: VICODIN.   MEDICATIONS AT HOME: 1.  Flonase 2 sprays intranasally daily as needed.  2.  Tylenol 50 mg p.o.  every 6 hours as needed.  3.  Warfarin 5 mg p.o. daily. 4.  Zofran 4 mg p.o. every 6 hours as needed.   REVIEW OF SYSTEMS: CONSTITUTIONAL: Positive for fatigue and generalized weakness.  EYES: No blurred or double vision.  ENT: No tinnitus or ear pain.  RESPIRATORY: No cough, wheezing, hemoptysis.  CARDIOVASCULAR: No chest pain, orthopnea or edema. GASTROINTESTINAL: No nausea, vomiting, diarrhea.  GENITOURINARY: No dysuria or hematuria.  ENDOCRINE: No polyuria or nocturia.  HEMATOLOGIC: No anemia or easy bruising.  SKIN: No rash or lesion.  MUSCULOSKELETAL: No joint pain or arthritis. He does have history of right ankle pain.  GENITOURINARY: Worsening renal failure.   PHYSICAL EXAMINATION: VITAL SIGNS: Temperature 98, heart rate 90 per minute, respirations 18 per minute, blood pressure 161/90 mmHg and he is saturating 99% on room air. GENERAL: The patient is a 54 year old male lying in the bed comfortably without any acute distress.  EYES: Pupils equal, round, and reactive to light and accommodation. No scleral icterus. Extraocular muscles intact.  HEENT: Head atraumatic, normocephalic. Oropharynx and nasopharynx clear.  NECK: Supple. No jugular venous distention. No thyroid enlargement or tenderness.  LUNGS: Clear to auscultation bilaterally. No wheezing, rales, rhonchi or crepitation.  HEART: S1, S2 normal. No murmurs, rubs or gallops. ABDOMEN: Soft, nontender, nondistended. Bowel sounds present. No organomegaly or masses.  EXTREMITIES: No pedal edema, cyanosis or clubbing. NEUROLOGIC: Nonfocal examination. Cranial nerves II through XII intact. Muscle strength 5/5 in all extremities. Sensation intact. Gait not checked.  PSYCHIATRY: The patient is alert  and oriented x3.  SKIN: No obvious rash, lesion or ulcer.   DIAGNOSTIC DATA: Laboratory panel: From yesterday, his creatinine was 2.27. His PT was 36.5. INR 3.8.   A 2-D echo obtained yesterday had normal LV function.   IMPRESSION  AND PLAN: 1.  Acute renal failure, likely from underlying lupus but will need confirmation by kidney biopsy, which is tentatively planned in the next 48 to 72 hours per nephrology. We will hold his Coumadin, give him vitamin K, and possibly consider heparin drip bridging. Will consult nephrology and oncology per Dr. Anthonette Legato.  2.  History of deep vein thrombosis, on Coumadin. We will hold off for right now as he might be needing kidney biopsy.  3.  Weakness, likely from worsening kidney function. We will monitor.  4.  Lupus: will c/s rheumatology.  CODE STATUS: FULL.  TOTAL TIME TAKING CARE OF THIS PATIENT: 45 minutes.  ____________________________ Lucina Mellow. Manuella Ghazi, MD vss:sb D: 03/28/2014 15:41:29 ET T: 03/28/2014 16:04:39 ET JOB#: [id number]  cc: [facility] S. Manuella Ghazi, MD, <Dictator> Milinda Pointer. Jacqualine Code, MD [place] Lilian Kapur, MD Simonne Come. Inez Pilgrim, MD Remer Macho MD ELECTRONICALLY SIGNED 04/01/2014 0:16

## 2015-04-14 NOTE — Discharge Summary (Signed)
PATIENT NAME:  Jonathon Snow, WINOGRAD MR#:  [id number] DATE OF BIRTH:  [birth year]  DATE OF ADMISSION:  03/12/2014  ADMITTING PHYSICIAN:  Dr. Lunette Stands  DATE OF DISCHARGE:  03/16/2014  DISCHARGING PHYSICIAN: Gladstone Lighter, MD   PRIMARY HEMATOLOGIST:  Dr. Inez Pilgrim   PRIMARY CARE PHYSICIAN:  Dr. Jacqualine Code  CONSULTATIONS IN THE HOSPITAL:   1.  GI consultation by Dr. Gustavo Lah.  2.  Surgical consultation by Dr. Rexene Edison. 3.  Hematology consultation by Dr. Inez Pilgrim.  DISCHARGE DIAGNOSES:   1.  Acute on chronic epigastric pain with nausea, vomiting, likely secondary to gastritis based on EGD. Abdominal angina cannot be ruled out. 2.  History of deep vein thrombosis and pulmonary embolism, status post retained IVC filter.  3.  Chronic kidney disease.  5.  Recent right ankle surgery.  DISCHARGE HOME MEDICATIONS:  1.  Flonase 50 mcg inhalation nasal spray 1 spray each nostril daily.  2.  MiraLAX powder p.r.n. for constipation.  3.  Tramadol 50 mg 3 times a day p.r.n. for pain.  4.  Coumadin 5 mg p.o. daily.  5.  Lovenox 95 mg subcutaneously twice a day for 7 days until INR is therapeutic.  6.  Zofran 4 mg q. 6 hours p.r.n. for nausea/vomiting.   DISCHARGE DIET: Regular diet.   DISCHARGE ACTIVITY: As tolerated.   FOLLOWUP INSTRUCTIONS: 1.  Follow up with Dr. Inez Pilgrim in 2 to 3 days. Get INR checked in 2 to 3 days with Dr. Inez Pilgrim. 2.  Lovenox can be discontinued once INR reaches goal, between 2 and 3.  3. To follow up as prior scheduled for the right ankle.  LABS AND IMAGING STUDIES:  INR 1.4 prior to discharge. WBC 2.5, hemoglobin 9.6, hematocrit 27.6, platelet count 124. Sodium 138, potassium 3.5, chloride 105, bicarb 27, BUN 12, creatinine 1.43, glucose 86, and calcium of 8.0. HIV test is pending. Myeloma test showing elevated kappa and lambda chains. Serum and urine  electrophoresis are pending. Serum iron is low at 49, iron binding capacity also low at 172. Direct Coombs test is negative. Retic  count and absolute retic count within normal limits. LDH is 156.   CT of the abdomen and pelvis with contrast showing acute/subacute-appearing compression deformity of L1 vertebral body, with approximately 20% height loss. No retropulsion seen. Mural-based polypoid opacity within proximal stomach near the GE junction, could be in complete distention. However, underlying mass cannot be ruled out. Small hiatal hernia is present.   Upper GI endoscopy done on 03/15/2014 showing gastritis and duodenitis, normal esophagus.   BRIEF HOSPITAL COURSE: 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 Inez Pilgrim as an outpatient for hematological issues, CKD, pancytopenia chronically, presents to the hospital secondary to acute on chronic abdominal pain. The patient had 3 admissions since January of this year for similar complaints, and was evaluated by multiple gastroenterologists and also surgeons for similar pain. He had multiple scans, which include CT, HIDA scan, ruling out all the causes for acute abdominal pain. He does not have right upper quadrant issues. His pain is more in the left upper quadrant. Not sure if it is muscular or internal pain. Recent CT showed thickening of the gastric wall, for which an upper GI endoscopy was performed, and it shows only mild gastritis.    The patient is now started on PPI. H. pylori serology is  pending. He is not requiring pain medications, and he states he is always in pain. Surgeons have evaluated the gallbladder as well, just has some sludge and prophylactically taking the gallbladder away might not help his pain. This was also offered to the patient, and he did not want to get any prophylactic cholecystectomy at this time. Since his pain seems to be better controlled at this time, as he is not requiring any IV or p.o. pain  medications, his nausea/vomiting have improved and he is able to tolerate a regular diet. Since the endoscopy and the CT did not show any acute causes for his pain, he is being discharged home at this time.   DVT/PE history, on Coumadin. Coumadin was held while in the hospital for his endoscopy. His INR now is at 1.4. He will need bridging, especially in light of his recent right ankle fracture, so he is started on Lovenox, and will have him follow with Dr. Inez Pilgrim as an outpatient in the next week. His course has been otherwise uneventful.   DISCHARGE CONDITION: Stable.   DISCHARGE DISPOSITION: Home.   Time spent on discharge is 40 minutes.     ____________________________ Gladstone Lighter, MD rk:mr D: 03/16/2014 15:18:00 ET T: 03/16/2014 18:52:26 ET JOB#: [id number]  cc: Gladstone Lighter, MD, <Dictator> Simonne Come. Inez Pilgrim, MD  Gladstone Lighter MD ELECTRONICALLY SIGNED 03/30/2014 13:50
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
## 2015-04-14 NOTE — Consult Note (Signed)
PATIENT NAME:  Jonathon Snow, Jonathon Snow MR#:  [id number] DATE OF BIRTH:  1961/03/17  DATE OF CONSULTATION:  03/12/2014  REFERRING PHYSICIAN:   CONSULTING PHYSICIAN:  Jerrol Banana. Burt Knack, MD  CHIEF COMPLAINT: Nausea and vomiting.   HISTORY OF PRESENT ILLNESS: This is a patient who was recently in the hospital and, in fact, discharged 2 days.  I talked to him on the phone yesterday and he was experiencing considerable nausea and vomiting since being discharged. His workup had suggested possible gallbladder disease but was inconclusive and he was anticoagulated so he was discharged asked to follow up in my office. He called yesterday and then ultimately went to the Emergency Room for renewed intractable vomiting. He denies any abdominal pain at this time, denies fevers or chills. Has not had a bowel movement today.   PAST MEDICAL HISTORY: Gallbladder sludge, a history of pulmonary embolism in June or July of 2014, ankle fracture, IVC filter and liver cystectomy.   PAST SURGICAL HISTORY: Cystectomy of the liver on the right lobe, right ankle surgery, IVC filter placement.   ALLERGIES: VICODIN.   MEDICATIONS: Coumadin, tramadol, Zofran, Levaquin and Flonase.   FAMILY HISTORY: Noncontributory.   SOCIAL HISTORY: The patient works at [employer] as a Freight forwarder. Does not smoke or drink.   REVIEW OF SYSTEMS: A 10-system review has been performed and negative with the exception of that mentioned in the HPI.    PHYSICAL EXAMINATION: GENERAL: Uncomfortable-appearing male patient.  VITAL SIGNS:  Temp of 97.8, pulse 75, respirations 18, blood pressure 157/90, 96% room air sat.  HEENT: No scleral icterus.  NECK: No palpable neck nodes.  CHEST: Clear to auscultation.  CARDIAC: Regular rate and rhythm.  ABDOMEN: Soft and essentially nontender.  EXTREMITIES: Without edema.  NEUROLOGIC: Grossly intact.   LABORATORY, DIAGNOSTIC AND RADIOLOGICAL DATA:  Reconciling imaging studies from this most recent hospitalization  and this admission, he had an ultrasound of the abdomen which showed no visible gallstones with some minimal wall thickening and a possible sludge ball or a polyp. He had a CT scan last night which did not show any significant gallbladder disease. He had a hepatobiliary scan done last week which was normal with an ejection fraction of 84%.   Laboratory values last night demonstrated relatively low white blood cell count of 3.3 and it has been low on multiple other occasions in the hospital, has never been elevated; H and H of 11.9 and 35.2 and a platelet count of 178. Electrolytes and liver function tests are essentially normal, albumin depressed at 3.3 and creatinine of 1.66 without an elevation in the bilirubin. This is fairly constant. In fact, earlier in the week his creatinine was 1.42 with a normal BUN is well.   ASSESSMENT AND PLAN: This is a patient with complex medical history including deep vein thrombosis. I had also elicited additional history from him that multiple family members are currently anticoagulated or have had a history of deep vein thrombosis and pulmonary embolism as well, suggestive of hypercoagulable state. He clearly has intractable nausea and vomiting, has had minimal abdominal pain with no signs of acute cholecystitis and no actual gallstones. He does have sludge in his gallbladder I suspect that a gastroenterology consultation and followup would be of value in this patient, especially in light of the CT findings suggestive of a mass although I think that is not likely to be present and I doubt that that is what is causing his symptoms, but he does need further workup. Consideration  of cholecystectomy for control of his symptoms should be entertained as well once his INR is normal but it is unclear to me if all of his symptoms and all of his laboratory values are related to the gallbladder. It would be hard to reconcile all of his symptoms and workup with sludge in the gallbladder.  This was discussed with the patient.  We will follow with the admitting team.   ____________________________ Jerrol Banana. Burt Knack, MD rec:cs D: 03/12/2014 09:48:15 ET T: 03/12/2014 14:05:42 ET JOB#: [id number]  cc: Jerrol Banana. Burt Knack, MD, <Dictator> Florene Glen MD ELECTRONICALLY SIGNED 03/12/2014 18:30
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
## 2015-04-14 NOTE — Discharge Summary (Signed)
PATIENT NAME:  Jonathon Snow, PERRELLI MR#:  [id number] DATE OF BIRTH:  [birth year]  DATE OF ADMISSION:  03/28/2014 DATE OF DISCHARGE:  04/01/2014  CONSULTANTS:   1.  Dr. Inez Pilgrim from hematology oncology.  2.  Dr. Candiss Norse and Dr. Juleen China from nephrology.  3.  Dr. Jefm Bryant from rheumatology.   CHIEF COMPLAINT:  Fatigue and very tired.   DISCHARGE DIAGNOSES: 1.  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  2.  Hypomagnesemia.  3.  History of pulmonary embolus status post inferior vena cava filter, on Coumadin, being on hold.  4.  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   DISCHARGE MEDICATIONS:  Tramadol 50 mg 1 tab 3 times a day as needed for pain, Flonase 50 mcg two sprays once a day as needed, tamsulosin 0.4 mg once a day, Percocet 5/325 1 tab every 4 to 6 hours as needed for pain.  Please stop using the warfarin.  Follow with Dr. Inez Pilgrim on Monday for resumption of your blood thinners for your blood clot.   DIET:  Low-sodium, renal diet.   ACTIVITY:  As tolerated.  No lifting of objects over 10 pounds for about 10 days.  Please follow with nephrology next week as discussed.  Please follow with PCP within 1 to 2 weeks.   DISPOSITION:  Home.   SIGNIFICANT LABORATORY DATA AND IMAGING STUDIES:  Initial BUN of 27, creatinine of 2.62, last creatinine of 2.33, sodium of 135.  Initial white count of 3.1.  Last white count of 2.4.  Initial hemoglobin of 9.7, platelets of 144.  Last INR 1.2 on April 10.   HISTORY OF PRESENT ILLNESS AND HOSPITAL COURSE:  For full details of H and P, please see the dictation on April 7 by Dr. Manuella Ghazi, but briefly this is a pleasant 54 year old with history of DVT and PE status post IVC filter placement with worsening renal function is being followed up as an outpatient.  The patient was admitted as he had increased fatigue and tired.  In regards to his worsening renal function.  The working diagnosis at this point was lupus nephritis and  the patient was to have a kidney biopsy and was therefore admitted for possible bridging of his anticoagulation and monitoring here.  He was admitted to the hospitalist service with nephrology consult.  He was also seen by Dr. Inez Pilgrim from hematology oncology and Dr. Jefm Bryant.  He was started on heparin drip and Coumadin was held.  He underwent successful biopsy on the 10th of April.  He was seen by nephrology today.  For pain he has been started on Percocet which appears to control the pain.  He does have history of positive ANA with positive double-stranded DNA, anti-chromatin antibodies, SSA antibodies and RNP bodies.  At this point, he will be following up on Monday with Dr. Inez Pilgrim for resumption of his anticoagulation with Coumadin and Lovenox bridging.  The patient is aware that he needs to follow up with Dr. Inez Pilgrim for this.  I will be holding his blood thinners for about 72 hours post renal biopsy.  His pancytopenia appears to be stable at this point and he will be following with oncology as an outpatient.   PHYSICAL EXAMINATION: VITAL SIGNS:  On the day of discharge, temperature is 98.2, pulse rate 83, respiratory rate 18, blood pressure 151/88, O2 sat 95%.  GENERAL:  The patient is a well-developed male lying in bed, talking in full sentences.  HEENT:  Normocephalic, atraumatic.  LUNGS:  Clear.  HEART:  Normal S1, S2.  ABDOMEN:  Soft, nontender, nondistended.  He does not have any significant tenderness to palpation on the flanks.   TOTAL TIME SPENT:  About 40 minutes planning this discharge today.    ____________________________ Vivien Presto, MD sa:ea D: 04/01/2014 12:14:54 ET T: 04/01/2014 16:06:34 ET JOB#: [id number]  cc: Vivien Presto, MD, <Dictator> Simonne Come. Inez Pilgrim, MD Tama High, MD Dr. Garen Grams B. Jacqualine Code, MD Karel Jarvis [facility] MD ELECTRONICALLY SIGNED 04/20/2014 14:24
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
## 2015-04-14 NOTE — Consult Note (Signed)
Brief Consult Note: Diagnosis: abd pain, n/v.   Patient was seen by consultant.   Consult note dictated.   Recommend further assessment or treatment.   Orders entered.   Comments: vomiting has resolved, has diffuse abd pain max in RUQ but also has left chest pain. Anticoagulated for VTE Hx and has strong family Hx of mult family members with VTE events. will consider repeating studies etc.  Reexamine. not clearly GB related.  Electronic Signatures: Florene Glen (MD)  (Signed 19-Mar-15 12:25)  Authored: Brief Consult Note   Last Updated: 19-Mar-15 12:25 by Florene Glen (MD)

## 2015-04-14 NOTE — Consult Note (Signed)
Brief Consult Note: Diagnosis: Persistent nausea and vomiting. Recenty diagnosed with pneumonia.  History of PE and DVT requiring chronic anticoagulant therapy.   Consult note dictated.   Comments: Patient's presentation discussed with Dr. Verdie Shire.  Dr. Candace Cruise will see patient first prior to proceeding with EGD to only allow direct luminal evaluation tomorrow.  Explained in detail to patient procedure, risks versus benefits.  Patient wishes to think about it and again final decision will be made once he is seen by Dr. Candace Cruise. Possible his symptoms are in correlation with acute cholecytitis based on his symptoms and continued symptoms.  Electronic Signatures: Payton Emerald (NP)  (Signed 19-Mar-15 16:26)  Authored: Brief Consult Note   Last Updated: 19-Mar-15 16:26 by Payton Emerald (NP)

## 2015-05-07 ENCOUNTER — Other Ambulatory Visit: Payer: Self-pay | Admitting: *Deleted

## 2015-05-07 DIAGNOSIS — Z86711 Personal history of pulmonary embolism: Secondary | ICD-10-CM

## 2015-05-22 ENCOUNTER — Other Ambulatory Visit: Payer: Self-pay | Admitting: Internal Medicine

## 2015-05-23 ENCOUNTER — Other Ambulatory Visit: Payer: 59

## 2015-05-23 ENCOUNTER — Ambulatory Visit: Payer: 59 | Admitting: Internal Medicine

## 2015-05-30 ENCOUNTER — Inpatient Hospital Stay (HOSPITAL_BASED_OUTPATIENT_CLINIC_OR_DEPARTMENT_OTHER): Payer: 59 | Admitting: Family Medicine

## 2015-05-30 ENCOUNTER — Telehealth: Payer: Self-pay | Admitting: *Deleted

## 2015-05-30 ENCOUNTER — Inpatient Hospital Stay: Payer: 59 | Attending: Family Medicine

## 2015-05-30 ENCOUNTER — Encounter: Payer: Self-pay | Admitting: Family Medicine

## 2015-05-30 VITALS — BP 130/73 | HR 90 | Temp 98.4°F | Resp 16 | Wt 229.1 lb

## 2015-05-30 DIAGNOSIS — R040 Epistaxis: Secondary | ICD-10-CM

## 2015-05-30 DIAGNOSIS — Z86718 Personal history of other venous thrombosis and embolism: Secondary | ICD-10-CM

## 2015-05-30 DIAGNOSIS — Z7901 Long term (current) use of anticoagulants: Secondary | ICD-10-CM | POA: Insufficient documentation

## 2015-05-30 DIAGNOSIS — Z86711 Personal history of pulmonary embolism: Secondary | ICD-10-CM

## 2015-05-30 DIAGNOSIS — E785 Hyperlipidemia, unspecified: Secondary | ICD-10-CM | POA: Diagnosis not present

## 2015-05-30 DIAGNOSIS — I10 Essential (primary) hypertension: Secondary | ICD-10-CM | POA: Insufficient documentation

## 2015-05-30 DIAGNOSIS — M329 Systemic lupus erythematosus, unspecified: Secondary | ICD-10-CM | POA: Insufficient documentation

## 2015-05-30 DIAGNOSIS — I2699 Other pulmonary embolism without acute cor pulmonale: Secondary | ICD-10-CM | POA: Insufficient documentation

## 2015-05-30 DIAGNOSIS — Z79899 Other long term (current) drug therapy: Secondary | ICD-10-CM | POA: Diagnosis not present

## 2015-05-30 DIAGNOSIS — R809 Proteinuria, unspecified: Secondary | ICD-10-CM | POA: Insufficient documentation

## 2015-05-30 LAB — CBC WITH DIFFERENTIAL/PLATELET
BASOS ABS: 0 10*3/uL (ref 0–0.1)
BASOS PCT: 1 %
EOS ABS: 0.1 10*3/uL (ref 0–0.7)
Eosinophils Relative: 3 %
HCT: 34.8 % — ABNORMAL LOW (ref 40.0–52.0)
Hemoglobin: 11 g/dL — ABNORMAL LOW (ref 13.0–18.0)
LYMPHS PCT: 16 %
Lymphs Abs: 0.6 10*3/uL — ABNORMAL LOW (ref 1.0–3.6)
MCH: 29.1 pg (ref 26.0–34.0)
MCHC: 31.7 g/dL — ABNORMAL LOW (ref 32.0–36.0)
MCV: 91.8 fL (ref 80.0–100.0)
Monocytes Absolute: 0.4 10*3/uL (ref 0.2–1.0)
Monocytes Relative: 10 %
NEUTROS PCT: 70 %
Neutro Abs: 2.7 10*3/uL (ref 1.4–6.5)
Platelets: 193 10*3/uL (ref 150–440)
RBC: 3.79 MIL/uL — ABNORMAL LOW (ref 4.40–5.90)
RDW: 15.7 % — AB (ref 11.5–14.5)
WBC: 3.9 10*3/uL (ref 3.8–10.6)

## 2015-05-30 LAB — PROTIME-INR
INR: 1.68
PROTHROMBIN TIME: 20 s — AB (ref 11.4–15.0)

## 2015-05-30 NOTE — Progress Notes (Signed)
[facility]  [phone]  [phone]     Jonathon Snow DOB: May 14, 1961  MR#: [id number]  HWT#:[id number]  Patient Care Team: No Pcp Per Patient as PCP - General (General Practice)  CHIEF COMPLAINT:  Chief Complaint  Patient presents with  . Follow-up    States that he's been having nose bleeds every day...    INTERVAL HISTORY:  Patient is here for further evaluation and treatment consideration regarding history of DVT/PE. He also has IVC filter placement, that is considered permanent. He is currently on 2-1/2 mg of Coumadin every Friday with 5 mg of Coumadin other days of the week. Patient is also followed by nephrology, pulmonology, and GI. Has a history of nephritis as well as lupus.  REVIEW OF SYSTEMS:   Review of Systems  Constitutional: Negative for fever, chills, weight loss, malaise/fatigue and diaphoresis.  HENT: Negative for congestion, ear discharge, ear pain, hearing loss, nosebleeds, sore throat and tinnitus.   Eyes: Negative for blurred vision, double vision, photophobia, pain, discharge and redness.  Respiratory: Negative for cough, hemoptysis, sputum production, shortness of breath, wheezing and stridor.   Cardiovascular: Negative for chest pain, palpitations, orthopnea, claudication, leg swelling and PND.  Gastrointestinal: Negative for heartburn, nausea, vomiting, abdominal pain, diarrhea, constipation, blood in stool and melena.  Genitourinary: Negative.   Musculoskeletal: Negative.   Skin: Negative.   Neurological: Negative for dizziness, tingling, focal weakness, seizures, weakness and headaches.  Endo/Heme/Allergies: Does not bruise/bleed easily.  Psychiatric/Behavioral: Negative for depression. The patient is not nervous/anxious and does not have insomnia.     As per HPI. Otherwise, a complete review of systems is negatve.  ONCOLOGY HISTORY:  No history exists.    PAST MEDICAL HISTORY: Past Medical History    Diagnosis Date  . Hypertension   . Pulmonary embolism   . DVT (deep venous thrombosis)   . Hyperlipidemia     PAST SURGICAL HISTORY: Past Surgical History  Procedure Laterality Date  . Cyst excision  92 or 93     Liver cyst removal [facility]    FAMILY HISTORY No family history on file.  GYNECOLOGIC HISTORY:  No LMP for male patient.     ADVANCED DIRECTIVES:    HEALTH MAINTENANCE: History  Substance Use Topics  . Smoking status: Never Smoker   . Smokeless tobacco: Not on file  . Alcohol Use: No     Colonoscopy:  PAP:  Bone density:  Lipid panel:  Allergies  Allergen Reactions  . Vicodin [Hydrocodone-Acetaminophen]     Rash/hives  . Hydrocodone Rash  . Oxycodone Rash    Current Outpatient Prescriptions  Medication Sig Dispense Refill  . azelastine (ASTELIN) 0.1 % nasal spray Place 2 sprays into both nostrils 2 (two) times daily as needed for rhinitis. Use in each nostril as directed    . enalapril (VASOTEC) 20 MG tablet Take 20 mg by mouth daily.    . hydroxychloroquine (PLAQUENIL) 200 MG tablet Take 200 mg by mouth at bedtime.    . Multiple Vitamins-Minerals (MULTIVITAMIN WITH MINERALS) tablet Take 1 tablet by mouth daily.    . mycophenolate (CELLCEPT) 500 MG tablet Take 1,000 mg by mouth 2 (two) times daily.    . prednisoLONE 5 MG TABS tablet Take 5 mg by mouth daily.    [name] enoxaparin (LOVENOX) 120 MG/0.8ML injection Inject 0.73 mLs (110 mg total) into the skin every 12 (twelve) hours. (Patient not taking: Reported on 03/17/2015) 60 Syringe 0  . gabapentin (NEURONTIN) 300 MG capsule  Take 300 mg by mouth at bedtime.    . ondansetron (ZOFRAN ODT) 4 MG disintegrating tablet 4mg  ODT q4 hours prn nausea/vomit (Patient not taking: Reported on 05/30/2015) 8 tablet 0  . ondansetron (ZOFRAN) 4 MG tablet Take 4 mg by mouth every 6 (six) hours as needed for nausea or vomiting.    [name] oxyCODONE-acetaminophen (PERCOCET/ROXICET) 5-325 MG per tablet Take 1-2 tablets by mouth every 4  (four) hours as needed (Pain). (Patient not taking: Reported on 03/17/2015) 60 tablet 0  . valACYclovir (VALTREX) 1000 MG tablet Take 1,000 mg by mouth 2 (two) times daily.    [name] warfarin (COUMADIN) 5 MG tablet TAKE AS DIRECTED BY PHYSICIAN 45 tablet 2   No current facility-administered medications for this visit.    OBJECTIVE: BP 130/73 mmHg  Pulse 90  Temp(Src) 98.4 F (36.9 C) (Tympanic)  Resp 16  Wt 229 lb 0.9 oz (103.9 kg)   Body mass index is 30.23 kg/(m^2).    ECOG FS:0 - Asymptomatic  General: Well-developed, well-nourished, no acute distress. Eyes: Pink conjunctiva, anicteric sclera. HEENT: Normocephalic, moist mucous membranes, clear oropharnyx. Lungs: Clear to auscultation bilaterally. Heart: Regular rate and rhythm. No rubs, murmurs, or gallops. Abdomen: Soft, nontender, nondistended. No organomegaly noted, normoactive bowel sounds. Musculoskeletal: No edema, cyanosis, or clubbing. Neuro: Alert, answering all questions appropriately. Cranial nerves grossly intact. Skin: No rashes or petechiae noted. Psych: Normal affect.    LAB RESULTS:     Component Value Date/Time   NA 138 04/10/2015 1036   NA 135 03/16/2015 2043   K 3.5 04/10/2015 1036   K 4.4 03/16/2015 2043   CL 108 04/10/2015 1036   CL 105 03/16/2015 2043   CO2 25 04/10/2015 1036   CO2 19 03/16/2015 2043   GLUCOSE 96 04/10/2015 1036   GLUCOSE 86 03/16/2015 2043   BUN 16 04/10/2015 1036   BUN 45* 03/16/2015 2043   CREATININE 1.34* 04/10/2015 1036   CREATININE 3.65* 03/16/2015 2043   CALCIUM 8.4* 04/10/2015 1036   CALCIUM 9.4 03/16/2015 2043   PROT 7.0 04/10/2015 1036   PROT 7.8 03/16/2015 2043   ALBUMIN 3.9 04/10/2015 1036   ALBUMIN 4.4 03/16/2015 2043   AST 19 04/10/2015 1036   AST 19 03/16/2015 2043   ALT 16* 04/10/2015 1036   ALT 22 03/16/2015 2043   ALKPHOS 71 04/10/2015 1036   ALKPHOS 73 03/16/2015 2043   BILITOT 0.7 03/16/2015 2043   GFRNONAA >60 04/10/2015 1036   GFRNONAA 18*  03/16/2015 2043   GFRAA >60 04/10/2015 1036   GFRAA 20* 03/16/2015 2043    No results found for: SPEP, UPEP  Lab Results  Component Value Date   WBC 3.9 05/30/2015   NEUTROABS 2.7 05/30/2015   HGB 11.0* 05/30/2015   HCT 34.8* 05/30/2015   MCV 91.8 05/30/2015   PLT 193 05/30/2015      Chemistry      Component Value Date/Time   NA 138 04/10/2015 1036   NA 135 03/16/2015 2043   K 3.5 04/10/2015 1036   K 4.4 03/16/2015 2043   CL 108 04/10/2015 1036   CL 105 03/16/2015 2043   CO2 25 04/10/2015 1036   CO2 19 03/16/2015 2043   BUN 16 04/10/2015 1036   BUN 45* 03/16/2015 2043   CREATININE 1.34* 04/10/2015 1036   CREATININE 3.65* 03/16/2015 2043      Component Value Date/Time   CALCIUM 8.4* 04/10/2015 1036   CALCIUM 9.4 03/16/2015 2043   ALKPHOS 71 04/10/2015 1036  ALKPHOS 73 03/16/2015 2043   AST 19 04/10/2015 1036   AST 19 03/16/2015 2043   ALT 16* 04/10/2015 1036   ALT 22 03/16/2015 2043   BILITOT 0.7 03/16/2015 2043       No results found for: LABCA2  No components found for: BLTJQ300  No results for input(s): INR in the last 168 hours.     Component Value Date/Time   COLORURINE YELLOW 03/17/2015 0025   APPEARANCEUR CLEAR 03/17/2015 0025   LABSPEC 1.014 03/17/2015 0025   PHURINE 5.0 03/17/2015 0025   GLUCOSEU NEGATIVE 03/17/2015 0025   HGBUR LARGE* 03/17/2015 0025   BILIRUBINUR NEGATIVE 03/17/2015 0025   KETONESUR NEGATIVE 03/17/2015 0025   PROTEINUR NEGATIVE 03/17/2015 0025   UROBILINOGEN 0.2 03/17/2015 0025   NITRITE NEGATIVE 03/17/2015 0025   LEUKOCYTESUR NEGATIVE 03/17/2015 0025    STUDIES: No results found.  ASSESSMENT:  Long-term anticoagulation for DVT and PE, recurrent.  PLAN:   1. DVT/PE. Today's INR 1.68. Advised patient to take an extra 2-1/2 mg today and to increase Coumadin dose to 5 mg every day. We'll reevaluate labs in 2 weeks.  Dr. Grayland Ormond was available for consultation and review of plan of care for this patient.  Patient  expressed understanding and was in agreement with this plan. He also understands that He can call clinic at any time with any questions, concerns, or complaints.    No matching staging information was found for the patient.  Evlyn Kanner, NP   05/30/2015 11:57 AM

## 2015-05-30 NOTE — Telephone Encounter (Signed)
[name], AGNP-C- Pt's INR 1.68, please advise him to take an extra 0.5 tablet today and take 5 mg everyday; will recheck INR again in 2 weeks... Pt instructed and repeated instructions given.[name] KitchenMarland Kitchen

## 2015-06-13 ENCOUNTER — Other Ambulatory Visit: Payer: 59

## 2015-07-11 ENCOUNTER — Inpatient Hospital Stay: Payer: 59 | Admitting: Family Medicine

## 2015-07-11 ENCOUNTER — Inpatient Hospital Stay: Payer: 59 | Attending: Family Medicine

## 2015-08-09 ENCOUNTER — Inpatient Hospital Stay: Payer: 59 | Attending: Family Medicine

## 2015-08-09 ENCOUNTER — Inpatient Hospital Stay: Payer: 59 | Admitting: Family Medicine

## 2015-08-21 ENCOUNTER — Other Ambulatory Visit: Payer: Self-pay | Admitting: *Deleted

## 2015-08-21 ENCOUNTER — Inpatient Hospital Stay: Payer: 59

## 2015-08-21 NOTE — Telephone Encounter (Signed)
Not my patient. Looks like NP Magda Paganini last saw him in early June. Since then he has been a No-show for appts in June, July, August. Please make appt to see NP Magda Paganini this week. If he is out of Coumadin, please call in a 1 week supply. Thanks.

## 2015-08-21 NOTE — Telephone Encounter (Signed)
Dr Inez Pilgrim / Magda Paganini patient

## 2015-08-24 ENCOUNTER — Inpatient Hospital Stay: Payer: 59 | Attending: Family Medicine

## 2015-08-24 DIAGNOSIS — Z7901 Long term (current) use of anticoagulants: Secondary | ICD-10-CM | POA: Diagnosis not present

## 2015-08-24 DIAGNOSIS — Z86718 Personal history of other venous thrombosis and embolism: Secondary | ICD-10-CM | POA: Diagnosis not present

## 2015-08-24 DIAGNOSIS — Z86711 Personal history of pulmonary embolism: Secondary | ICD-10-CM | POA: Insufficient documentation

## 2015-08-24 DIAGNOSIS — Z5181 Encounter for therapeutic drug level monitoring: Secondary | ICD-10-CM | POA: Diagnosis not present

## 2015-08-24 LAB — PROTIME-INR
INR: 1.55
Prothrombin Time: 18.8 seconds — ABNORMAL HIGH (ref 11.4–15.0)

## 2015-08-28 ENCOUNTER — Telehealth: Payer: Self-pay | Admitting: *Deleted

## 2015-08-28 ENCOUNTER — Other Ambulatory Visit: Payer: Self-pay | Admitting: *Deleted

## 2015-08-28 ENCOUNTER — Other Ambulatory Visit: Payer: Self-pay | Admitting: Family Medicine

## 2015-08-28 DIAGNOSIS — Z86711 Personal history of pulmonary embolism: Secondary | ICD-10-CM

## 2015-08-28 MED ORDER — WARFARIN SODIUM 5 MG PO TABS: ORAL_TABLET | ORAL | Status: DC

## 2015-08-28 NOTE — Telephone Encounter (Addendum)
Pt was instructed to change his Coumadin regimen from 5 mg daily  TO Coumadin 5 mg Monday thru Friday and 7.5 mg on Saturday & Sunday; AND recheck in 2 weeks ~ as per Georgeanne Nim, AGNP-C for an INR of 1.55 on 08/25/15..Pt states that he can come in on Tuesday, 09/11/15; as Tuesdays are best for his work schedule.[name] KitchenHe verbalized an understanding of instructions given...  Refill e-scribed to pt's pharmacy.[name] KitchenMarland Kitchen

## 2015-09-11 ENCOUNTER — Inpatient Hospital Stay: Payer: 59

## 2015-09-25 ENCOUNTER — Inpatient Hospital Stay: Payer: 59

## 2015-09-25 ENCOUNTER — Other Ambulatory Visit: Payer: Self-pay | Admitting: Family Medicine

## 2015-09-25 ENCOUNTER — Telehealth: Payer: Self-pay | Admitting: Family Medicine

## 2015-09-25 ENCOUNTER — Inpatient Hospital Stay: Payer: 59 | Attending: Family Medicine

## 2015-09-25 DIAGNOSIS — Z7901 Long term (current) use of anticoagulants: Secondary | ICD-10-CM | POA: Diagnosis not present

## 2015-09-25 DIAGNOSIS — Z86718 Personal history of other venous thrombosis and embolism: Secondary | ICD-10-CM | POA: Insufficient documentation

## 2015-09-25 DIAGNOSIS — Z5181 Encounter for therapeutic drug level monitoring: Secondary | ICD-10-CM | POA: Insufficient documentation

## 2015-09-25 DIAGNOSIS — Z86711 Personal history of pulmonary embolism: Secondary | ICD-10-CM | POA: Diagnosis not present

## 2015-09-25 DIAGNOSIS — I2699 Other pulmonary embolism without acute cor pulmonale: Secondary | ICD-10-CM

## 2015-09-25 LAB — PROTIME-INR
INR: 1.22
Prothrombin Time: 15.6 seconds — ABNORMAL HIGH (ref 11.4–15.0)

## 2015-09-25 MED ORDER — WARFARIN SODIUM 5 MG PO TABS: ORAL_TABLET | ORAL | Status: DC

## 2015-09-25 NOTE — Telephone Encounter (Signed)
Made telephone call to patient to discuss results. Will recheck labs in 2 weeks.

## 2015-10-01 ENCOUNTER — Emergency Department
Admission: EM | Admit: 2015-10-01 | Discharge: 2015-10-01 | Disposition: A | Discharge status: Home / Self Care | Payer: 59 | Attending: Emergency Medicine | Admitting: Emergency Medicine

## 2015-10-01 ENCOUNTER — Emergency Department: Payer: 59

## 2015-10-01 ENCOUNTER — Encounter: Payer: Self-pay | Admitting: Emergency Medicine

## 2015-10-01 DIAGNOSIS — J449 Chronic obstructive pulmonary disease, unspecified: Secondary | ICD-10-CM | POA: Diagnosis not present

## 2015-10-01 DIAGNOSIS — R197 Diarrhea, unspecified: Secondary | ICD-10-CM | POA: Insufficient documentation

## 2015-10-01 DIAGNOSIS — R059 Cough, unspecified: Secondary | ICD-10-CM

## 2015-10-01 DIAGNOSIS — Z79899 Other long term (current) drug therapy: Secondary | ICD-10-CM | POA: Diagnosis not present

## 2015-10-01 DIAGNOSIS — B349 Viral infection, unspecified: Secondary | ICD-10-CM | POA: Diagnosis not present

## 2015-10-01 DIAGNOSIS — M7989 Other specified soft tissue disorders: Secondary | ICD-10-CM | POA: Diagnosis not present

## 2015-10-01 DIAGNOSIS — R05 Cough: Secondary | ICD-10-CM | POA: Insufficient documentation

## 2015-10-01 DIAGNOSIS — Z7952 Long term (current) use of systemic steroids: Secondary | ICD-10-CM | POA: Diagnosis not present

## 2015-10-01 DIAGNOSIS — N289 Disorder of kidney and ureter, unspecified: Secondary | ICD-10-CM | POA: Diagnosis not present

## 2015-10-01 DIAGNOSIS — I10 Essential (primary) hypertension: Secondary | ICD-10-CM | POA: Insufficient documentation

## 2015-10-01 DIAGNOSIS — R112 Nausea with vomiting, unspecified: Secondary | ICD-10-CM | POA: Diagnosis present

## 2015-10-01 DIAGNOSIS — Z7901 Long term (current) use of anticoagulants: Secondary | ICD-10-CM | POA: Insufficient documentation

## 2015-10-01 DIAGNOSIS — Z86711 Personal history of pulmonary embolism: Secondary | ICD-10-CM | POA: Insufficient documentation

## 2015-10-01 DIAGNOSIS — Z86718 Personal history of other venous thrombosis and embolism: Secondary | ICD-10-CM | POA: Insufficient documentation

## 2015-10-01 HISTORY — DX: Systemic lupus erythematosus, unspecified: M32.9

## 2015-10-01 HISTORY — DX: Disorder of kidney and ureter, unspecified: N28.9

## 2015-10-01 HISTORY — DX: Reserved for concepts with insufficient information to code with codable children: IMO0002

## 2015-10-01 LAB — COMPREHENSIVE METABOLIC PANEL
ALT: 40 U/L (ref 17–63)
ANION GAP: 4 — AB (ref 5–15)
AST: 29 U/L (ref 15–41)
Albumin: 3.9 g/dL (ref 3.5–5.0)
Alkaline Phosphatase: 76 U/L (ref 38–126)
BILIRUBIN TOTAL: 0.7 mg/dL (ref 0.3–1.2)
BUN: 28 mg/dL — ABNORMAL HIGH (ref 6–20)
CHLORIDE: 108 mmol/L (ref 101–111)
CO2: 26 mmol/L (ref 22–32)
Calcium: 8.4 mg/dL — ABNORMAL LOW (ref 8.9–10.3)
Creatinine, Ser: 1.53 mg/dL — ABNORMAL HIGH (ref 0.61–1.24)
GFR calc Af Amer: 58 mL/min — ABNORMAL LOW (ref >60)
GFR, EST NON AFRICAN AMERICAN: 50 mL/min — AB (ref >60)
Glucose, Bld: 103 mg/dL — ABNORMAL HIGH (ref 65–99)
POTASSIUM: 3.7 mmol/L (ref 3.5–5.1)
Sodium: 138 mmol/L (ref 135–145)
TOTAL PROTEIN: 7 g/dL (ref 6.5–8.1)

## 2015-10-01 LAB — CBC
HEMATOCRIT: 38.3 % — AB (ref 40.0–52.0)
HEMOGLOBIN: 12.8 g/dL — AB (ref 13.0–18.0)
MCH: 28.8 pg (ref 26.0–34.0)
MCHC: 33.3 g/dL (ref 32.0–36.0)
MCV: 86.6 fL (ref 80.0–100.0)
Platelets: 138 10*3/uL — ABNORMAL LOW (ref 150–440)
RBC: 4.43 MIL/uL (ref 4.40–5.90)
RDW: 14.3 % (ref 11.5–14.5)
WBC: 3.2 10*3/uL — AB (ref 3.8–10.6)

## 2015-10-01 LAB — URINALYSIS COMPLETE WITH MICROSCOPIC (ARMC ONLY)
BACTERIA UA: NONE SEEN
BILIRUBIN URINE: NEGATIVE
Glucose, UA: NEGATIVE mg/dL
KETONES UR: NEGATIVE mg/dL
LEUKOCYTES UA: NEGATIVE
NITRITE: NEGATIVE
Protein, ur: NEGATIVE mg/dL
Specific Gravity, Urine: 1.016 (ref 1.005–1.030)
pH: 5 (ref 5.0–8.0)

## 2015-10-01 LAB — FIBRIN DERIVATIVES D-DIMER (ARMC ONLY): FIBRIN DERIVATIVES D-DIMER (ARMC): 639 — AB (ref 0–499)

## 2015-10-01 LAB — PROTIME-INR
INR: 1.84
Prothrombin Time: 21.4 seconds — ABNORMAL HIGH (ref 11.4–15.0)

## 2015-10-01 LAB — LIPASE, BLOOD: LIPASE: 37 U/L (ref 22–51)

## 2015-10-01 MED ORDER — IOHEXOL 350 MG/ML SOLN
75.0000 mL | Freq: Once | INTRAVENOUS | Status: AC | PRN
  Administered 2015-10-01: 75 mL via INTRAVENOUS

## 2015-10-01 MED ORDER — ONDANSETRON HCL 4 MG/2ML IJ SOLN
4.0000 mg | Freq: Once | INTRAMUSCULAR | Status: AC | PRN
  Administered 2015-10-01: 4 mg via INTRAVENOUS

## 2015-10-01 MED ORDER — ONDANSETRON HCL 4 MG PO TABS: 4.0000 mg | ORAL_TABLET | Freq: Three times a day (TID) | ORAL | Status: DC | PRN

## 2015-10-01 MED ORDER — OXYCODONE-ACETAMINOPHEN 5-325 MG PO TABS: 1.0000 | ORAL_TABLET | Freq: Four times a day (QID) | ORAL | Status: DC | PRN

## 2015-10-01 MED ORDER — IPRATROPIUM-ALBUTEROL 0.5-2.5 (3) MG/3ML IN SOLN
3.0000 mL | Freq: Once | RESPIRATORY_TRACT | Status: AC
  Administered 2015-10-01: 3 mL via RESPIRATORY_TRACT
  Filled 2015-10-01: qty 3

## 2015-10-01 MED ORDER — SODIUM CHLORIDE 0.9 % IV BOLUS (SEPSIS)
1000.0000 mL | Freq: Once | INTRAVENOUS | Status: AC
  Administered 2015-10-01: 1000 mL via INTRAVENOUS

## 2015-10-01 MED ORDER — ALBUTEROL SULFATE HFA 108 (90 BASE) MCG/ACT IN AERS: 2.0000 | INHALATION_SPRAY | Freq: Four times a day (QID) | RESPIRATORY_TRACT | Status: DC | PRN

## 2015-10-01 MED ORDER — ONDANSETRON HCL 4 MG/2ML IJ SOLN
INTRAMUSCULAR | Status: AC
  Administered 2015-10-01: 4 mg via INTRAVENOUS
  Filled 2015-10-01: qty 2

## 2015-10-01 NOTE — ED Notes (Signed)
4 oz ginger ale given to patient. 

## 2015-10-01 NOTE — Discharge Instructions (Signed)
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
## 2015-10-01 NOTE — ED Notes (Signed)
AAOx3.  Skin warm and dry. NAD.  Ambulates with easy and steady gait.   

## 2015-10-01 NOTE — ED Notes (Signed)
Patient presents to the ED with nausea and vomiting for about 2 weeks.  Patient reports vomiting about 6 times in the past 24 hours.  Patient was given an antibiotic over 1 week ago and is not feeling any better.  Patient appears pale and clammy.  Patient denies fever.  Patient is complaining of aching in arms, legs, and neck.  Patient also reports cough and congestion.

## 2015-10-01 NOTE — ED Notes (Signed)
AAOx3.  Skin warm and dry.  NAD 

## 2015-10-01 NOTE — ED Notes (Signed)
To CT scan via stretcher.

## 2015-10-04 ENCOUNTER — Ambulatory Visit (INDEPENDENT_AMBULATORY_CARE_PROVIDER_SITE_OTHER): Payer: 59 | Admitting: Family Medicine

## 2015-10-04 ENCOUNTER — Encounter: Payer: Self-pay | Admitting: Family Medicine

## 2015-10-04 VITALS — BP 132/70 | HR 85 | Temp 98.6°F | Resp 18 | Ht 74.0 in | Wt 230.6 lb

## 2015-10-04 DIAGNOSIS — R05 Cough: Secondary | ICD-10-CM

## 2015-10-04 DIAGNOSIS — Z7901 Long term (current) use of anticoagulants: Secondary | ICD-10-CM

## 2015-10-04 DIAGNOSIS — R319 Hematuria, unspecified: Secondary | ICD-10-CM | POA: Diagnosis not present

## 2015-10-04 DIAGNOSIS — R6889 Other general symptoms and signs: Secondary | ICD-10-CM

## 2015-10-04 DIAGNOSIS — R058 Other specified cough: Secondary | ICD-10-CM

## 2015-10-04 LAB — POC INFLUENZA A&B (BINAX/QUICKVUE)
INFLUENZA B, POC: NEGATIVE
Influenza A, POC: NEGATIVE

## 2015-10-04 MED ORDER — MOXIFLOXACIN HCL 400 MG PO TABS: 400.0000 mg | ORAL_TABLET | Freq: Every day | ORAL | Status: DC

## 2015-10-04 MED ORDER — GUAIFENESIN-CODEINE 100-10 MG/5ML PO SOLN: 10.0000 mL | Freq: Four times a day (QID) | ORAL | Status: DC | PRN

## 2015-10-04 NOTE — Progress Notes (Signed)
Name: Jonathon Snow   MRN: [id number]    DOB: 1961-01-13   Date:10/04/2015       Progress Note  Subjective  Chief Complaint  Chief Complaint  Patient presents with  . Nausea    NP (Dr. Miles Costain) vomiting x3 weeks   . Shortness of Breath  . Cough    patient has a bad cough that persist/ chills    Cough This is a new problem. Episode onset: 3 weeks ago. The problem has been unchanged. The cough is productive of sputum (yellowish mucus.). Associated symptoms include chills, myalgias, nasal congestion and postnasal drip. Pertinent negatives include no ear pain, fever or sore throat. He has tried a beta-agonist inhaler and prescription cough suppressant (Was prescribed oxycodone for cough by the ER which has not helped.) for the symptoms. The treatment provided no relief. His past medical history is significant for bronchitis (CXR in ER showed changes of chronic bronchitis.). Has Stage 3 CKD, managed by Nephrolgoy.  Hematuria This is a new problem. Episode onset: most recent time was 3 days ago when Urinalysis was done in the ER. He describes the hematuria as microscopic hematuria. He is experiencing no pain. He describes his urine color as yellow. Irritative symptoms do not include nocturia or urgency. Obstructive symptoms include a slower stream (sometimes experiences slower stream than usual) and straining. Obstructive symptoms do not include dribbling. Associated symptoms include chills and flank pain (experiencing bilateral flank pain for a few weeks.). Pertinent negatives include no dysuria, fever, genital pain or hesitancy. 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 Since then, he has been on Coumadin, managed by Oncology.IVC filter placed during the same time frame. Most recent INR was subtherapeutic (goal between 2-3). Currently on Coumadin 5 mg daily. No episodes of bleeding except blood in urine detected on urine  dipstick in the ER 3 days ago. Past Medical History  Diagnosis Date  . Hypertension   . Pulmonary embolism ([place])   . DVT (deep venous thrombosis) ([place])   . Hyperlipidemia   . Lupus ([place])   . Renal disorder     Past Surgical History  Procedure Laterality Date  . Cyst excision  92 or 93     Liver cyst removal [facility]  . Ankle surgery Right     History reviewed. No pertinent family history.  Social History   Social History  . Marital Status: Divorced    Spouse Name: N/A  . Number of Children: N/A  . Years of Education: N/A   Occupational History  . Not on file.   Social History Main Topics  . Smoking status: Never Smoker   . Smokeless tobacco: Not on file  . Alcohol Use: No  . Drug Use: Not on file  . Sexual Activity: Not on file   Other Topics Concern  . Not on file   Social History Narrative    Current outpatient prescriptions:  .  albuterol (PROVENTIL HFA;VENTOLIN HFA) 108 (90 BASE) MCG/ACT inhaler, Inhale 2 puffs into the lungs every 6 (six) hours as needed for wheezing or shortness of breath., Disp: 1 Inhaler, Rfl: 2 .  azelastine (ASTELIN) 0.1 % nasal spray, Place 2 sprays into both nostrils 2 (two) times daily as needed for rhinitis. Use in each nostril as directed, Disp: , Rfl:  .  gabapentin (NEURONTIN) 300 MG capsule, Take 300 mg by mouth at bedtime., Disp: , Rfl:  .  hydroxychloroquine (PLAQUENIL) 200 MG tablet, Take 200 mg by mouth at bedtime., Disp: , Rfl:  .  losartan (COZAAR) 50 MG tablet, Take 50 mg by mouth daily., Disp: , Rfl: 6 .  LYRICA 150 MG capsule, Take 150 mg by mouth 2 (two) times daily., Disp: , Rfl: 5 .  Multiple Vitamins-Minerals (MULTIVITAMIN WITH MINERALS) tablet, Take 1 tablet by mouth daily., Disp: , Rfl:  .  mycophenolate (CELLCEPT) 500 MG tablet, Take 1,000 mg by mouth 2 (two) times daily., Disp: , Rfl:  .  ondansetron (ZOFRAN ODT) 4 MG disintegrating tablet, 4mg  ODT q4 hours prn nausea/vomit, Disp: 8 tablet, Rfl: 0 .  ondansetron  (ZOFRAN) 4 MG tablet, Take 1 tablet (4 mg total) by mouth every 8 (eight) hours as needed for nausea or vomiting., Disp: 21 tablet, Rfl: 0 .  prednisoLONE 5 MG TABS tablet, Take 5 mg by mouth daily., Disp: , Rfl:  .  warfarin (COUMADIN) 5 MG tablet, 5mg  daily, Disp: 30 tablet, Rfl: 0  Allergies  Allergen Reactions  . Vicodin [Hydrocodone-Acetaminophen] Hives and Other (See Comments)    Rash/hives Other Reaction: severe headaches Rash/hives  . Hydrocodone Rash  . Oxycodone Rash    Takes occasionally.   Review of Systems  Constitutional: Positive for chills. Negative for fever.  HENT: Positive for postnasal drip. Negative for ear pain and sore throat.   Respiratory: Positive for cough.   Genitourinary: Positive for hematuria and flank pain (experiencing bilateral flank pain for a few weeks.). Negative for dysuria, hesitancy, urgency and nocturia.  Musculoskeletal: Positive for myalgias.    Objective  Filed Vitals:   10/04/15 0922  BP: 132/70  Pulse: 85  Temp: 98.6 F (37 C)  TempSrc: Oral  Resp: 18  Height: 6\' 2"  (1.88 m)  Weight: 230 lb 9.6 oz (104.599 kg)  SpO2: 96%    Physical Exam  Constitutional: He is oriented to person, place, and time and well-developed, well-nourished, and in no distress.  HENT:  Head: Normocephalic and atraumatic.  Mouth/Throat: Posterior oropharyngeal erythema present.  Cardiovascular: Normal rate, regular rhythm and normal heart sounds.   Pulmonary/Chest: Effort normal and breath sounds normal.  Abdominal: Soft. Bowel sounds are normal. There is no tenderness. There is CVA tenderness.  Musculoskeletal: He exhibits no edema.  Neurological: He is alert and oriented to person, place, and time.  Skin: Skin is warm and dry.  Nursing note and vitals reviewed.   Assessment & Plan  1. Flu-like symptoms POC influenza test is negative - POC Influenza A&B (Binax test)  2. Recurrent productive cough Started patient on Moxifloxacin for recurrent  productive cough. Patient has previously finished a course of Z-Pak. Educated on the interaction between moxifloxacin and Coumadin and advised to hold Coumadin for 2 days 1013 and 1014. Return on Monday 1017 to repeat INR. - moxifloxacin (AVELOX) 400 MG tablet; Take 1 tablet (400 mg total) by mouth daily.  Dispense: 7 tablet; Refill: 0 - guaiFENesin-codeine 100-10 MG/5ML syrup; Take 10 mLs by mouth every 6 (six) hours as needed for cough.  Dispense: 280 mL; Refill: 0  3. Hematuria Obtain renal ultrasound to rule out kidney stones. - US Renal; Future - Urinalysis, Routine w reflex microscopic  4. Anticoagulation monitoring, INR range 2-3 INR at 3.0 in office today. Since patient is being started on moxifloxacin, advised to hold Coumadin on 10/13 and 10/14 him a restart on 10/15 and 10/16 at his usual dosage and recheck INR on 10/17. Advised to return for bleeding very carefully.  Patient verbalized understanding with the plan. - POCT INR    Nashley Cordoba [name] 10/04/2015 10:25 AM

## 2015-10-08 ENCOUNTER — Ambulatory Visit
Admission: RE | Admit: 2015-10-08 | Discharge: 2015-10-08 | Disposition: A | Discharge status: Home / Self Care | Payer: 59 | Source: Ambulatory Visit | Attending: Family Medicine | Admitting: Family Medicine

## 2015-10-08 DIAGNOSIS — R319 Hematuria, unspecified: Secondary | ICD-10-CM | POA: Insufficient documentation

## 2015-10-09 ENCOUNTER — Other Ambulatory Visit: Payer: 59

## 2015-10-09 ENCOUNTER — Ambulatory Visit: Payer: Self-pay | Admitting: Family Medicine

## 2015-10-09 ENCOUNTER — Inpatient Hospital Stay: Payer: 59

## 2015-10-09 DIAGNOSIS — Z86718 Personal history of other venous thrombosis and embolism: Secondary | ICD-10-CM | POA: Diagnosis not present

## 2015-10-09 DIAGNOSIS — I2699 Other pulmonary embolism without acute cor pulmonale: Secondary | ICD-10-CM

## 2015-10-09 LAB — PROTIME-INR
INR: 1.55
Prothrombin Time: 18.8 seconds — ABNORMAL HIGH (ref 11.4–15.0)

## 2015-10-10 ENCOUNTER — Telehealth: Payer: Self-pay | Admitting: *Deleted

## 2015-10-10 LAB — PROTHROMBIN TIME + INR
INR: 1.6
PT: 18.8

## 2015-10-10 LAB — PROTIME-INR: INR: 1.6 — AB (ref 0.9–1.1)

## 2015-10-10 NOTE — Telephone Encounter (Signed)
   Ref Range 1d ago  9d ago  2wk ago  37mo ago      Prothrombin Time 11.4 - 15.0 seconds 18.8 (H) 21.4 (H) 15.6 (H) 18.8 (H)    INR  1.55 1.84 1.22 1.55   Resulting Agency  [facility]      Specimen Collected: 10/09/15 10:52 AM Last Resulted: 10/09/15 11:14 AM              Pt was instructed to change his Coumadin regimen from 5 mg daily TO Coumadin 5 mg Monday thru Friday and 7.5 mg on Saturday & Sunday; AND recheck in 2 weeks ~ as per Georgeanne Nim, AGNP-C for an INR of 1.55 on 08/25/15..Pt states that he can come in on Tuesday, 09/11/15; as Tuesdays are best for his work schedule.[name] KitchenHe verbalized an understanding of instructions given... Refill e-scribed to pt's pharmacy.[name] KitchenMarland Kitchen

## 2015-10-10 NOTE — Telephone Encounter (Signed)
Per Dr Rogue Bussing, coumadin changes as follows 7.5 mg on M-W-F, 5 mg all the other days of the week. Patient notified and repeated back to me

## 2015-10-26 ENCOUNTER — Inpatient Hospital Stay: Payer: 59

## 2015-10-26 ENCOUNTER — Inpatient Hospital Stay: Payer: 59 | Attending: Internal Medicine | Admitting: Internal Medicine

## 2015-10-26 VITALS — BP 143/87 | HR 70 | Temp 97.5°F | Wt 239.6 lb

## 2015-10-26 DIAGNOSIS — I10 Essential (primary) hypertension: Secondary | ICD-10-CM | POA: Insufficient documentation

## 2015-10-26 DIAGNOSIS — E785 Hyperlipidemia, unspecified: Secondary | ICD-10-CM | POA: Insufficient documentation

## 2015-10-26 DIAGNOSIS — Z79899 Other long term (current) drug therapy: Secondary | ICD-10-CM

## 2015-10-26 DIAGNOSIS — Z86718 Personal history of other venous thrombosis and embolism: Secondary | ICD-10-CM

## 2015-10-26 DIAGNOSIS — I2699 Other pulmonary embolism without acute cor pulmonale: Secondary | ICD-10-CM

## 2015-10-26 DIAGNOSIS — M329 Systemic lupus erythematosus, unspecified: Secondary | ICD-10-CM | POA: Diagnosis not present

## 2015-10-26 DIAGNOSIS — Z7901 Long term (current) use of anticoagulants: Secondary | ICD-10-CM

## 2015-10-26 DIAGNOSIS — R5383 Other fatigue: Secondary | ICD-10-CM | POA: Diagnosis not present

## 2015-10-26 DIAGNOSIS — Z86711 Personal history of pulmonary embolism: Secondary | ICD-10-CM | POA: Diagnosis not present

## 2015-10-26 LAB — PROTIME-INR
INR: 2.06
Prothrombin Time: 23.4 seconds — ABNORMAL HIGH (ref 11.4–15.0)

## 2015-10-26 NOTE — Progress Notes (Signed)
[place] OFFICE PROGRESS NOTE  Patient Care Team: Roselee Nova, MD as PCP - General (Family Medicine)   SUMMARY OF ONCOLOGIC HISTORY:  # 2013- UNPROVOKED PE/DVT on coumadin  [s/p IVC filter];   # 2007- PE   # Nephritis [cellcept/prednisolone;~creat 1.5]  INTERVAL HISTORY: A pleasant 54 year old male patient with above history of DVT/PE 2 currently on Coumadin is here for follow-up. Patient seems to be tolerating Coumadin fairly well except for intermittent nosebleeds.  He complains of extreme fatigue. Worsening over the last many months. He denies any other blood in stools black stools. Denies any falls.  REVIEW OF SYSTEMS:  A complete 10 point review of system is done which is negative except mentioned above/history of present illness.   PAST MEDICAL HISTORY :  Past Medical History  Diagnosis Date  . Hypertension   . Pulmonary embolism ([place])   . DVT (deep venous thrombosis) ([place])   . Hyperlipidemia   . Lupus ([place])   . Renal disorder     PAST SURGICAL HISTORY :   Past Surgical History  Procedure Laterality Date  . Cyst excision  92 or 93     Liver cyst removal [facility]  . Ankle surgery Right     FAMILY HISTORY :  No family history on file.  SOCIAL HISTORY:   Social History  Substance Use Topics  . Smoking status: Never Smoker   . Smokeless tobacco: Not on file  . Alcohol Use: No    ALLERGIES:  is allergic to vicodin; hydrocodone; and oxycodone.  MEDICATIONS:  Current Outpatient Prescriptions  Medication Sig Dispense Refill  . albuterol (PROVENTIL HFA;VENTOLIN HFA) 108 (90 BASE) MCG/ACT inhaler Inhale 2 puffs into the lungs every 6 (six) hours as needed for wheezing or shortness of breath. 1 Inhaler 2  . hydroxychloroquine (PLAQUENIL) 200 MG tablet Take 200 mg by mouth at bedtime.    [name] losartan (COZAAR) 50 MG tablet Take 50 mg by mouth daily.  6  . LYRICA 150 MG capsule Take 150 mg by mouth 2 (two) times daily.  5  . Multiple  Vitamins-Minerals (MULTIVITAMIN WITH MINERALS) tablet Take 1 tablet by mouth daily.    . mycophenolate (CELLCEPT) 500 MG tablet Take 1,000 mg by mouth 2 (two) times daily.    . prednisoLONE 5 MG TABS tablet Take 5 mg by mouth daily.    [name] warfarin (COUMADIN) 5 MG tablet 5mg  daily 30 tablet 0   No current facility-administered medications for this visit.    PHYSICAL EXAMINATION: ECOG PERFORMANCE STATUS: 1 - Symptomatic but completely ambulatory  BP 143/87 mmHg  Pulse 70  Temp(Src) 97.5 F (36.4 C) (Tympanic)  Wt 239 lb 10.2 oz (108.7 kg)  Filed Weights   10/26/15 1059  Weight: 239 lb 10.2 oz (108.7 kg)    GENERAL: Well-nourished well-developed; Alert, no distress and comfortable.  Alone.  EYES: no pallor or icterus OROPHARYNX: no thrush or ulceration; good dentition  NECK: supple, no masses felt LYMPH:  no palpable lymphadenopathy in the cervical, axillary or inguinal regions LUNGS: clear to auscultation and  No wheeze or crackles HEART/CVS: regular rate & rhythm and no murmurs; No lower extremity edema ABDOMEN:abdomen soft, non-tender and normal bowel sounds Musculoskeletal:no cyanosis of digits and no clubbing  PSYCH: alert & oriented x 3 with fluent speech NEURO: no focal motor/sensory deficits SKIN: Ecchymosis noted. No active bleeding.  LABORATORY DATA:  I have reviewed the data as listed    Component Value Date/Time  NA 138 10/01/2015 1511   NA 138 04/10/2015 1036   K 3.7 10/01/2015 1511   K 3.5 04/10/2015 1036   CL 108 10/01/2015 1511   CL 108 04/10/2015 1036   CO2 26 10/01/2015 1511   CO2 25 04/10/2015 1036   GLUCOSE 103* 10/01/2015 1511   GLUCOSE 96 04/10/2015 1036   BUN 28* 10/01/2015 1511   BUN 16 04/10/2015 1036   CREATININE 1.53* 10/01/2015 1511   CREATININE 1.34* 04/10/2015 1036   CALCIUM 8.4* 10/01/2015 1511   CALCIUM 8.4* 04/10/2015 1036   PROT 7.0 10/01/2015 1511   PROT 7.0 04/10/2015 1036   ALBUMIN 3.9 10/01/2015 1511   ALBUMIN 3.9 04/10/2015  1036   AST 29 10/01/2015 1511   AST 19 04/10/2015 1036   ALT 40 10/01/2015 1511   ALT 16* 04/10/2015 1036   ALKPHOS 76 10/01/2015 1511   ALKPHOS 71 04/10/2015 1036   BILITOT 0.7 10/01/2015 1511   BILITOT 0.7 04/10/2015 1036   GFRNONAA 50* 10/01/2015 1511   GFRNONAA >60 04/10/2015 1036   GFRNONAA 32* 10/05/2014 1545   GFRAA 58* 10/01/2015 1511   GFRAA >60 04/10/2015 1036   GFRAA 39* 10/05/2014 1545    No results found for: SPEP, UPEP  Lab Results  Component Value Date   WBC 3.2* 10/01/2015   NEUTROABS 2.7 05/30/2015   HGB 12.8* 10/01/2015   HCT 38.3* 10/01/2015   MCV 86.6 10/01/2015   PLT 138* 10/01/2015      Chemistry      Component Value Date/Time   NA 138 10/01/2015 1511   NA 138 04/10/2015 1036   K 3.7 10/01/2015 1511   K 3.5 04/10/2015 1036   CL 108 10/01/2015 1511   CL 108 04/10/2015 1036   CO2 26 10/01/2015 1511   CO2 25 04/10/2015 1036   BUN 28* 10/01/2015 1511   BUN 16 04/10/2015 1036   CREATININE 1.53* 10/01/2015 1511   CREATININE 1.34* 04/10/2015 1036      Component Value Date/Time   CALCIUM 8.4* 10/01/2015 1511   CALCIUM 8.4* 04/10/2015 1036   ALKPHOS 76 10/01/2015 1511   ALKPHOS 71 04/10/2015 1036   AST 29 10/01/2015 1511   AST 19 04/10/2015 1036   ALT 40 10/01/2015 1511   ALT 16* 04/10/2015 1036   BILITOT 0.7 10/01/2015 1511   BILITOT 0.7 04/10/2015 1036       RADIOGRAPHIC STUDIES: I have personally reviewed the radiological images as listed and agreed with the findings in the report. No results found.   ASSESSMENT & PLAN:   # DVT PE 2- patient on anticoagulation with Coumadin. Indefinite period. INR today was 2.0. Patient is on 5 mg once a day.  # History of nephritis/lupus- following up with nephrology.  # Fatigue-unclear etiology. Recommend follow up with PCP- regarding hypothyroidism workup etc.  # Regarding nosebleeds- recommend Vaseline to the nares.   # Check PT/INR monthly basis; follow-up with me in 6 months with CBC CMP  PT/INR.   All questions were answered. The patient knows to call the clinic with any problems, questions or concerns. No barriers to learning was detected.  I spent 15 minutes counseling the patient face to face. The total time spent in the appointment was 30 minutes and more than 50% was on counseling and review of test results     Cammie Sickle, MD 10/26/2015 11:28 AM

## 2015-10-26 NOTE — Progress Notes (Signed)
Patient has no energy.  Wants to discuss with MD and look at options.  States he is wiped out when he gets home from work.  He usually goes to bed or "sacks out on the couch".

## 2015-11-09 ENCOUNTER — Telehealth: Payer: Self-pay | Admitting: Family Medicine

## 2015-11-09 NOTE — Telephone Encounter (Signed)
Venlafaxine is not on active medication list. Patient will need an appointment for refills.

## 2015-11-09 NOTE — Telephone Encounter (Signed)
PT NEEDS REFILL ON VENLAFAXINE. PHARM IS [facility] [address]

## 2015-11-09 NOTE — Telephone Encounter (Signed)
Routed to Dr. Manuella Ghazi for approval this medication Venlafaxine is not on patient's med list

## 2015-11-13 ENCOUNTER — Other Ambulatory Visit: Payer: Self-pay | Admitting: Family Medicine

## 2015-11-13 DIAGNOSIS — J302 Other seasonal allergic rhinitis: Secondary | ICD-10-CM

## 2015-11-23 ENCOUNTER — Inpatient Hospital Stay: Payer: 59 | Attending: Internal Medicine

## 2015-11-28 ENCOUNTER — Telehealth: Payer: Self-pay | Admitting: *Deleted

## 2015-11-28 DIAGNOSIS — Z86711 Personal history of pulmonary embolism: Secondary | ICD-10-CM

## 2015-11-28 MED ORDER — WARFARIN SODIUM 5 MG PO TABS: ORAL_TABLET | ORAL | Status: DC

## 2015-11-28 NOTE — Telephone Encounter (Signed)
RF request sent for Comadin RF Warfarin 5 mg tablet 1 tablet every day.

## 2015-12-21 ENCOUNTER — Inpatient Hospital Stay: Payer: 59

## 2015-12-27 ENCOUNTER — Other Ambulatory Visit: Payer: Self-pay | Admitting: *Deleted

## 2015-12-27 ENCOUNTER — Inpatient Hospital Stay: Payer: 59 | Attending: Internal Medicine

## 2015-12-27 DIAGNOSIS — Z7901 Long term (current) use of anticoagulants: Secondary | ICD-10-CM | POA: Insufficient documentation

## 2015-12-27 DIAGNOSIS — Z86711 Personal history of pulmonary embolism: Secondary | ICD-10-CM | POA: Diagnosis not present

## 2015-12-27 DIAGNOSIS — Z86718 Personal history of other venous thrombosis and embolism: Secondary | ICD-10-CM | POA: Diagnosis not present

## 2015-12-27 DIAGNOSIS — I2699 Other pulmonary embolism without acute cor pulmonale: Secondary | ICD-10-CM

## 2015-12-27 LAB — PROTIME-INR
INR: 1.52
PROTHROMBIN TIME: 18.4 s — AB (ref 11.4–15.0)

## 2015-12-27 MED ORDER — WARFARIN SODIUM 1 MG PO TABS: ORAL_TABLET | ORAL | Status: DC

## 2015-12-27 MED ORDER — WARFARIN SODIUM 5 MG PO TABS: ORAL_TABLET | ORAL | Status: DC

## 2015-12-27 NOTE — Telephone Encounter (Signed)
Pt is out of Warfarin and needs refill today. He has not shown up for his past 2 lab checks. He did reluctantly agree to come in for lab today. He will try to be here at 3    Ref Range 20mo ago (10/26/15) 75mo ago (10/09/15) 25mo ago (10/09/15) 78mo ago (10/09/15)     Prothrombin Time 11.4 - 15.0 seconds 23.4 (H) 18.8 (H)      INR  2.06 1.55 1.6 (A) 1.6

## 2015-12-27 NOTE — Telephone Encounter (Signed)
Per patient, he only eats lettuce and his diet is pretty consistent, no greens or anything else she should not be eating. He states he is taking his 5 mg coumadin every day within an hour of the same time consistently. We discussed that it is his responsibility to keep his lab appts and that if he does not, we will let his PCP handle his dosage adjustments; he stated that he understands and "you are right" He scheduled lab appt for next Thursday. I told him that he can check with pharmacy for new rx and dosing for 6 mg M-W-F and 5 mg all other days. He thanked me for calling. Med E scribed

## 2015-12-27 NOTE — Telephone Encounter (Signed)
Left msg for patient return my call re: coumadin adjustments.

## 2016-01-03 ENCOUNTER — Inpatient Hospital Stay: Payer: 59

## 2016-01-03 DIAGNOSIS — I2699 Other pulmonary embolism without acute cor pulmonale: Secondary | ICD-10-CM

## 2016-01-03 DIAGNOSIS — Z86718 Personal history of other venous thrombosis and embolism: Secondary | ICD-10-CM | POA: Diagnosis not present

## 2016-01-03 LAB — COMPREHENSIVE METABOLIC PANEL
ALBUMIN: 3.9 g/dL (ref 3.5–5.0)
ALT: 16 U/L — ABNORMAL LOW (ref 17–63)
ANION GAP: 4 — AB (ref 5–15)
AST: 17 U/L (ref 15–41)
Alkaline Phosphatase: 66 U/L (ref 38–126)
BILIRUBIN TOTAL: 0.8 mg/dL (ref 0.3–1.2)
BUN: 26 mg/dL — ABNORMAL HIGH (ref 6–20)
CALCIUM: 8.6 mg/dL — AB (ref 8.9–10.3)
CO2: 27 mmol/L (ref 22–32)
Chloride: 102 mmol/L (ref 101–111)
Creatinine, Ser: 1.35 mg/dL — ABNORMAL HIGH (ref 0.61–1.24)
GFR calc Af Amer: >60 mL/min (ref >60)
GFR calc non Af Amer: 58 mL/min — ABNORMAL LOW (ref >60)
GLUCOSE: 94 mg/dL (ref 65–99)
POTASSIUM: 3.8 mmol/L (ref 3.5–5.1)
SODIUM: 133 mmol/L — AB (ref 135–145)
Total Protein: 7.2 g/dL (ref 6.5–8.1)

## 2016-01-03 LAB — CBC WITH DIFFERENTIAL/PLATELET
BASOS PCT: 1 %
Basophils Absolute: 0 10*3/uL (ref 0–0.1)
EOS ABS: 0.1 10*3/uL (ref 0–0.7)
EOS PCT: 4 %
HEMATOCRIT: 40.8 % (ref 40.0–52.0)
Hemoglobin: 13.4 g/dL (ref 13.0–18.0)
Lymphocytes Relative: 17 %
Lymphs Abs: 0.6 10*3/uL — ABNORMAL LOW (ref 1.0–3.6)
MCH: 29 pg (ref 26.0–34.0)
MCHC: 32.8 g/dL (ref 32.0–36.0)
MCV: 88.4 fL (ref 80.0–100.0)
MONO ABS: 0.5 10*3/uL (ref 0.2–1.0)
MONOS PCT: 15 %
Neutro Abs: 2.3 10*3/uL (ref 1.4–6.5)
Neutrophils Relative %: 63 %
Platelets: 164 10*3/uL (ref 150–440)
RBC: 4.61 MIL/uL (ref 4.40–5.90)
RDW: 15 % — AB (ref 11.5–14.5)
WBC: 3.6 10*3/uL — ABNORMAL LOW (ref 3.8–10.6)

## 2016-01-03 NOTE — Addendum Note (Signed)
Addended by: Betti Cruz on: 01/03/2016 03:01 PM   Modules accepted: Orders

## 2016-01-04 ENCOUNTER — Inpatient Hospital Stay: Payer: 59

## 2016-01-04 ENCOUNTER — Encounter: Payer: Self-pay | Admitting: Internal Medicine

## 2016-01-04 ENCOUNTER — Ambulatory Visit: Payer: 59 | Admitting: Family Medicine

## 2016-01-04 ENCOUNTER — Telehealth: Payer: Self-pay | Admitting: *Deleted

## 2016-01-04 DIAGNOSIS — Z86711 Personal history of pulmonary embolism: Secondary | ICD-10-CM

## 2016-01-04 DIAGNOSIS — Z86718 Personal history of other venous thrombosis and embolism: Secondary | ICD-10-CM | POA: Diagnosis not present

## 2016-01-04 LAB — PROTIME-INR
INR: 1.12
Prothrombin Time: 14.6 seconds (ref 11.4–15.0)

## 2016-01-04 NOTE — Telephone Encounter (Signed)
Pt called. Educated patient to take Coumadin 6 mg once a day repeat. he should repeat PT INR in 1 week.Apt. Given to patient for next Thursday 01/10/16 at 8:30am. The patient will contact our office to let us know if this will not work for his schedule. Otherwise, he accepted this apointment. Teach back process performed with patient.

## 2016-01-04 NOTE — Progress Notes (Signed)
Recommend Coumadin 6 mg once a day repeat.  Repeat PT INR in 1 week.  Spoke to [place].

## 2016-01-10 ENCOUNTER — Inpatient Hospital Stay: Payer: 59

## 2016-01-10 ENCOUNTER — Encounter: Payer: Self-pay | Admitting: Internal Medicine

## 2016-01-10 ENCOUNTER — Telehealth: Payer: Self-pay | Admitting: *Deleted

## 2016-01-10 DIAGNOSIS — I2699 Other pulmonary embolism without acute cor pulmonale: Secondary | ICD-10-CM

## 2016-01-10 DIAGNOSIS — Z86718 Personal history of other venous thrombosis and embolism: Secondary | ICD-10-CM | POA: Diagnosis not present

## 2016-01-10 LAB — PROTIME-INR
INR: 1.65
PROTHROMBIN TIME: 19.5 s — AB (ref 11.4–15.0)

## 2016-01-10 NOTE — Progress Notes (Signed)
Recommend Coumadin 6 mg Monday through Friday; Coumadin 8 mg Saturday and Sunday. Repeat PT INR in 1 week; please inform pt.

## 2016-01-10 NOTE — Telephone Encounter (Signed)
msg received from Dr. Rogue Bussing "Recommend Coumadin 6 mg Monday through Friday; Coumadin 8 mg Saturday and Sunday. Repeat PT INR in 1 week; please inform pt."   1700-Called patient. Left vm with above md recommendations. Asked pt to contact our office back to let us know that he received this msg. RN needs to ensure teach back method was performed.  Pt already has an apt for next Friday 01/18/16 for next redraw.

## 2016-01-15 ENCOUNTER — Encounter: Payer: Self-pay | Admitting: Family Medicine

## 2016-01-15 ENCOUNTER — Ambulatory Visit (INDEPENDENT_AMBULATORY_CARE_PROVIDER_SITE_OTHER): Payer: 59 | Admitting: Family Medicine

## 2016-01-15 VITALS — BP 140/76 | HR 81 | Temp 97.6°F | Resp 20 | Ht 74.0 in | Wt 244.6 lb

## 2016-01-15 DIAGNOSIS — E669 Obesity, unspecified: Secondary | ICD-10-CM | POA: Diagnosis not present

## 2016-01-15 DIAGNOSIS — I10 Essential (primary) hypertension: Secondary | ICD-10-CM | POA: Diagnosis not present

## 2016-01-15 DIAGNOSIS — E782 Mixed hyperlipidemia: Secondary | ICD-10-CM | POA: Insufficient documentation

## 2016-01-15 DIAGNOSIS — E785 Hyperlipidemia, unspecified: Secondary | ICD-10-CM | POA: Diagnosis not present

## 2016-01-15 DIAGNOSIS — M5410 Radiculopathy, site unspecified: Secondary | ICD-10-CM | POA: Diagnosis not present

## 2016-01-15 NOTE — Progress Notes (Signed)
Name: Jonathon Snow   MRN: [id number]    DOB: Jul 30, 1961   Date:01/15/2016       Progress Note  Subjective  Chief Complaint  Chief Complaint  Patient presents with  . Follow-up    3 mo  . Shortness of Breath    Hypertension This is a chronic problem. The problem is controlled. Pertinent negatives include no chest pain, headaches, palpitations or shortness of breath. Past treatments include angiotensin blockers. There are no compliance problems.  Hypertensive end-organ damage includes kidney disease. There is no history of CAD/MI or CVA.  Hyperlipidemia This is a chronic problem. The problem is uncontrolled. Recent lipid tests were reviewed and are high. Pertinent negatives include no chest pain or shortness of breath. He is currently on no antihyperlipidemic treatment (In the past, he has been on cholesterol-lowering therapy).    Past Medical History  Diagnosis Date  . Hypertension   . Pulmonary embolism ([place])   . DVT (deep venous thrombosis) ([place])   . Hyperlipidemia   . Lupus ([place])   . Renal disorder     Past Surgical History  Procedure Laterality Date  . Cyst excision  92 or 93     Liver cyst removal [facility]  . Ankle surgery Right     History reviewed. No pertinent family history.  Social History   Social History  . Marital Status: Divorced    Spouse Name: N/A  . Number of Children: N/A  . Years of Education: N/A   Occupational History  . Not on file.   Social History Main Topics  . Smoking status: Never Smoker   . Smokeless tobacco: Not on file  . Alcohol Use: No  . Drug Use: Not on file  . Sexual Activity: Not on file   Other Topics Concern  . Not on file   Social History Narrative     Current outpatient prescriptions:  .  hydroxychloroquine (PLAQUENIL) 200 MG tablet, Take 200 mg by mouth at bedtime., Disp: , Rfl:  .  losartan (COZAAR) 50 MG tablet, Take 50 mg by mouth daily., Disp: , Rfl: 6 .  LYRICA 150 MG capsule, Take 150 mg by mouth 2 (two)  times daily., Disp: , Rfl: 5 .  Multiple Vitamins-Minerals (MULTIVITAMIN WITH MINERALS) tablet, Take 1 tablet by mouth daily., Disp: , Rfl:  .  mycophenolate (CELLCEPT) 500 MG tablet, Take 1,000 mg by mouth 2 (two) times daily., Disp: , Rfl:  .  prednisoLONE 5 MG TABS tablet, Take 5 mg by mouth daily., Disp: , Rfl:  .  warfarin (COUMADIN) 1 MG tablet, Take 1 tablet on Monday Wednesday and Friday with a 5 mg tablet, Disp: 15 tablet, Rfl: 0 .  warfarin (COUMADIN) 5 MG tablet, 5mg  daily, Take a 1 mg tablet on Monday Wednesday and Friday with the 5 mg tablet for a total dose of 6 mg 3 days a wk and 5 mg 4 days a wk, Disp: 30 tablet, Rfl: 0  Allergies  Allergen Reactions  . Vicodin [Hydrocodone-Acetaminophen] Hives and Other (See Comments)    Rash/hives Other Reaction: severe headaches Rash/hives Other Reaction: severe headaches Rash/hives  . Hydrocodone Rash  . Oxycodone Rash    Takes occasionally.    Review of Systems  Respiratory: Negative for shortness of breath.   Cardiovascular: Negative for chest pain and palpitations.  Neurological: Negative for headaches.    Objective  Filed Vitals:   01/15/16 1219  BP: 140/76  Pulse: 81  Temp: 97.6 F (36.4 C)  TempSrc: Oral  Resp: 20  Height: 6\' 2"  (1.88 m)  Weight: 244 lb 9.6 oz (110.95 kg)  SpO2: 99%    Physical Exam  Constitutional: He is well-developed, well-nourished, and in no distress.  HENT:  Head: Normocephalic and atraumatic.  Cardiovascular: Normal rate and regular rhythm.   No murmur heard. Pulmonary/Chest: Effort normal and breath sounds normal. He has no wheezes.  Abdominal: Soft. Bowel sounds are normal. There is no tenderness.  Musculoskeletal: He exhibits edema (2+ pitting edema).  Nursing note and vitals reviewed.     Assessment & Plan  1. Hyperlipidemia  - Lipid Profile  2. Essential hypertension BP stable and controlled on present therapy.  3. Acute low back pain with radicular symptoms,  duration less than 6 weeks  - DG Lumbar Spine Complete; Future  4. Obesity (BMI 30.0-34.9)  - TSH - POCT HgB A1C - Lipid Profile   Jonathon Snow Jonathon A. Jonathon Snow 01/15/2016 12:36 PM

## 2016-01-18 ENCOUNTER — Inpatient Hospital Stay: Payer: 59

## 2016-01-18 LAB — LIPID PANEL
Chol/HDL Ratio: 4.1 ratio units (ref 0.0–5.0)
Cholesterol, Total: 223 mg/dL — ABNORMAL HIGH (ref 100–199)
HDL: 54 mg/dL (ref >39)
LDL Calculated: 155 mg/dL — ABNORMAL HIGH (ref 0–99)
Triglycerides: 70 mg/dL (ref 0–149)
VLDL Cholesterol Cal: 14 mg/dL (ref 5–40)

## 2016-01-18 LAB — TSH: TSH: 1.45 u[IU]/mL (ref 0.450–4.500)

## 2016-01-22 ENCOUNTER — Encounter: Payer: Self-pay | Admitting: *Deleted

## 2016-01-22 ENCOUNTER — Other Ambulatory Visit: Payer: Self-pay | Admitting: Hematology and Oncology

## 2016-01-22 ENCOUNTER — Inpatient Hospital Stay: Payer: 59

## 2016-01-22 ENCOUNTER — Telehealth: Payer: Self-pay | Admitting: *Deleted

## 2016-01-22 DIAGNOSIS — I2699 Other pulmonary embolism without acute cor pulmonale: Secondary | ICD-10-CM

## 2016-01-22 DIAGNOSIS — Z86711 Personal history of pulmonary embolism: Secondary | ICD-10-CM

## 2016-01-22 DIAGNOSIS — Z86718 Personal history of other venous thrombosis and embolism: Secondary | ICD-10-CM | POA: Diagnosis not present

## 2016-01-22 LAB — PROTIME-INR
INR: 1.49
PROTHROMBIN TIME: 18.1 s — AB (ref 11.4–15.0)

## 2016-01-22 MED ORDER — WARFARIN SODIUM 5 MG PO TABS: ORAL_TABLET | ORAL | Status: DC

## 2016-01-22 NOTE — Telephone Encounter (Signed)
Called patient. INR today is 1.49. Inquired about the dosage of coumadin that patient is taken as patient is not in therapeutic range.  He states that he "did not receive the message from the cancer center last week to increase his dose of warfarin." pt currently taking coumadin 6 mg daily.  MD recommend that patient start taking coumadin 6mg  M-F and coumadin 8 mg on Sat and Sunday. We will recheck his labs again in 1 week. An apt was provided for the patient to return on 01/29/16 at 1030 am for his repeat lab pt/inr draw. A refill of his coumadin 5 mg tablets was sent to his pharmacy. Teach back process performed with patient.

## 2016-01-22 NOTE — Telephone Encounter (Signed)
Patient's coumadin 5 mg called into [facility]. RX did not escribed.

## 2016-01-23 ENCOUNTER — Other Ambulatory Visit: Payer: Self-pay | Admitting: Hematology and Oncology

## 2016-01-24 NOTE — Telephone Encounter (Signed)
  This is Dr. Aletha Halim patient.  M

## 2016-01-29 ENCOUNTER — Inpatient Hospital Stay: Payer: 59 | Attending: Internal Medicine

## 2016-01-29 DIAGNOSIS — Z7901 Long term (current) use of anticoagulants: Secondary | ICD-10-CM | POA: Insufficient documentation

## 2016-01-29 DIAGNOSIS — Z86711 Personal history of pulmonary embolism: Secondary | ICD-10-CM | POA: Insufficient documentation

## 2016-01-29 DIAGNOSIS — Z86718 Personal history of other venous thrombosis and embolism: Secondary | ICD-10-CM | POA: Insufficient documentation

## 2016-02-12 ENCOUNTER — Other Ambulatory Visit: Payer: Self-pay | Admitting: Orthopedic Surgery

## 2016-02-12 DIAGNOSIS — S82851K Displaced trimalleolar fracture of right lower leg, subsequent encounter for closed fracture with nonunion: Secondary | ICD-10-CM

## 2016-02-14 ENCOUNTER — Other Ambulatory Visit: Payer: 59

## 2016-02-15 ENCOUNTER — Inpatient Hospital Stay: Payer: 59

## 2016-02-19 ENCOUNTER — Inpatient Hospital Stay: Payer: 59

## 2016-02-19 ENCOUNTER — Telehealth: Payer: Self-pay | Admitting: Internal Medicine

## 2016-02-19 DIAGNOSIS — Z7901 Long term (current) use of anticoagulants: Secondary | ICD-10-CM | POA: Diagnosis not present

## 2016-02-19 DIAGNOSIS — Z86711 Personal history of pulmonary embolism: Secondary | ICD-10-CM | POA: Diagnosis not present

## 2016-02-19 DIAGNOSIS — Z86718 Personal history of other venous thrombosis and embolism: Secondary | ICD-10-CM | POA: Diagnosis present

## 2016-02-19 DIAGNOSIS — I2699 Other pulmonary embolism without acute cor pulmonale: Secondary | ICD-10-CM

## 2016-02-19 LAB — PROTIME-INR
INR: 2.4
Prothrombin Time: 25.9 seconds — ABNORMAL HIGH (ref 11.4–15.0)

## 2016-02-19 NOTE — Telephone Encounter (Signed)
Please inform pt that INR is therapeutic; continue current dose of coumadin [please confirm with pt]; and repeat PT/INR in 2 weeks.

## 2016-02-19 NOTE — Telephone Encounter (Signed)
Pt's current dosing is Coumadin 6 mg (a 5 mg tablet and 1 mg tablet to equal 6 mg) Monday-Sunday.  Pt instructed to keep same dose. We will recheck his INR in 2 wks. An appt was provided for 03/04/16 at 9am.

## 2016-02-20 ENCOUNTER — Ambulatory Visit: Payer: 59 | Admitting: Family Medicine

## 2016-02-20 ENCOUNTER — Inpatient Hospital Stay: Admission: RE | Admit: 2016-02-20 | Payer: 59 | Source: Ambulatory Visit

## 2016-02-26 ENCOUNTER — Ambulatory Visit
Admission: RE | Admit: 2016-02-26 | Discharge: 2016-02-26 | Disposition: A | Discharge status: Home / Self Care | Payer: 59 | Source: Ambulatory Visit | Attending: Orthopedic Surgery | Admitting: Orthopedic Surgery

## 2016-02-26 DIAGNOSIS — S82851K Displaced trimalleolar fracture of right lower leg, subsequent encounter for closed fracture with nonunion: Secondary | ICD-10-CM

## 2016-03-04 ENCOUNTER — Other Ambulatory Visit: Payer: 59

## 2016-03-12 ENCOUNTER — Other Ambulatory Visit: Payer: 59

## 2016-03-12 ENCOUNTER — Inpatient Hospital Stay: Payer: 59 | Attending: Internal Medicine

## 2016-03-14 ENCOUNTER — Inpatient Hospital Stay: Payer: 59

## 2016-03-29 ENCOUNTER — Emergency Department
Admission: EM | Admit: 2016-03-29 | Discharge: 2016-03-29 | Disposition: A | Discharge status: Home / Self Care | Payer: 59 | Attending: Emergency Medicine | Admitting: Emergency Medicine

## 2016-03-29 DIAGNOSIS — K529 Noninfective gastroenteritis and colitis, unspecified: Secondary | ICD-10-CM | POA: Insufficient documentation

## 2016-03-29 DIAGNOSIS — E785 Hyperlipidemia, unspecified: Secondary | ICD-10-CM | POA: Diagnosis not present

## 2016-03-29 DIAGNOSIS — Z7901 Long term (current) use of anticoagulants: Secondary | ICD-10-CM | POA: Diagnosis not present

## 2016-03-29 DIAGNOSIS — R1030 Lower abdominal pain, unspecified: Secondary | ICD-10-CM | POA: Diagnosis present

## 2016-03-29 DIAGNOSIS — E669 Obesity, unspecified: Secondary | ICD-10-CM | POA: Insufficient documentation

## 2016-03-29 DIAGNOSIS — I10 Essential (primary) hypertension: Secondary | ICD-10-CM | POA: Insufficient documentation

## 2016-03-29 DIAGNOSIS — I2699 Other pulmonary embolism without acute cor pulmonale: Secondary | ICD-10-CM | POA: Diagnosis not present

## 2016-03-29 DIAGNOSIS — M329 Systemic lupus erythematosus, unspecified: Secondary | ICD-10-CM | POA: Diagnosis not present

## 2016-03-29 DIAGNOSIS — J449 Chronic obstructive pulmonary disease, unspecified: Secondary | ICD-10-CM | POA: Insufficient documentation

## 2016-03-29 DIAGNOSIS — N289 Disorder of kidney and ureter, unspecified: Secondary | ICD-10-CM | POA: Diagnosis not present

## 2016-03-29 LAB — CBC WITH DIFFERENTIAL/PLATELET
Basophils Absolute: 0.1 10*3/uL (ref 0–0.1)
Basophils Relative: 0 %
EOS PCT: 0 %
Eosinophils Absolute: 0 10*3/uL (ref 0–0.7)
HEMATOCRIT: 42.3 % (ref 40.0–52.0)
Hemoglobin: 14.1 g/dL (ref 13.0–18.0)
LYMPHS ABS: 0.5 10*3/uL — AB (ref 1.0–3.6)
LYMPHS PCT: 4 %
MCH: 29.1 pg (ref 26.0–34.0)
MCHC: 33.4 g/dL (ref 32.0–36.0)
MCV: 87.2 fL (ref 80.0–100.0)
MONO ABS: 1.4 10*3/uL — AB (ref 0.2–1.0)
MONOS PCT: 10 %
NEUTROS ABS: 11.7 10*3/uL — AB (ref 1.4–6.5)
Neutrophils Relative %: 86 %
PLATELETS: 127 10*3/uL — AB (ref 150–440)
RBC: 4.85 MIL/uL (ref 4.40–5.90)
RDW: 15 % — AB (ref 11.5–14.5)
WBC: 13.6 10*3/uL — ABNORMAL HIGH (ref 3.8–10.6)

## 2016-03-29 LAB — COMPREHENSIVE METABOLIC PANEL
ALT: 17 U/L (ref 17–63)
AST: 18 U/L (ref 15–41)
Albumin: 3.9 g/dL (ref 3.5–5.0)
Alkaline Phosphatase: 68 U/L (ref 38–126)
Anion gap: 7 (ref 5–15)
BUN: 23 mg/dL — ABNORMAL HIGH (ref 6–20)
CHLORIDE: 96 mmol/L — AB (ref 101–111)
CO2: 25 mmol/L (ref 22–32)
CREATININE: 1.54 mg/dL — AB (ref 0.61–1.24)
Calcium: 8.1 mg/dL — ABNORMAL LOW (ref 8.9–10.3)
GFR, EST AFRICAN AMERICAN: 57 mL/min — AB (ref >60)
GFR, EST NON AFRICAN AMERICAN: 50 mL/min — AB (ref >60)
Glucose, Bld: 129 mg/dL — ABNORMAL HIGH (ref 65–99)
POTASSIUM: 3.7 mmol/L (ref 3.5–5.1)
Sodium: 128 mmol/L — ABNORMAL LOW (ref 135–145)
TOTAL PROTEIN: 7.7 g/dL (ref 6.5–8.1)
Total Bilirubin: 1.5 mg/dL — ABNORMAL HIGH (ref 0.3–1.2)

## 2016-03-29 LAB — LIPASE, BLOOD: LIPASE: 20 U/L (ref 11–51)

## 2016-03-29 MED ORDER — DICYCLOMINE HCL 20 MG PO TABS: 20.0000 mg | ORAL_TABLET | Freq: Three times a day (TID) | ORAL | Status: DC | PRN

## 2016-03-29 MED ORDER — SODIUM CHLORIDE 0.9 % IV BOLUS (SEPSIS)
1000.0000 mL | Freq: Once | INTRAVENOUS | Status: AC
  Administered 2016-03-29: 1000 mL via INTRAVENOUS

## 2016-03-29 MED ORDER — ONDANSETRON HCL 4 MG PO TABS: 4.0000 mg | ORAL_TABLET | Freq: Three times a day (TID) | ORAL | Status: DC | PRN

## 2016-03-29 NOTE — ED Notes (Signed)
Pt states vomiting and diarrhea 11 times since last night for past 2 days. Also c/o abd pain and dizziness.
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
## 2016-03-29 NOTE — ED Notes (Signed)
Paper signature obtained.

## 2016-03-29 NOTE — Discharge Instructions (Signed)
Please seek medical attention for any high fevers, chest pain, shortness of breath, change in behavior, persistent vomiting, bloody stool or any other new or concerning symptoms.  Viral Gastroenteritis Viral gastroenteritis is also called stomach flu. This illness is caused by a certain type of germ (virus). It can cause sudden watery poop (diarrhea) and throwing up (vomiting). This can cause you to lose body fluids (dehydration). This illness usually lasts for 3 to 8 days. It usually goes away on its own. HOME CARE   Drink enough fluids to keep your pee (urine) clear or pale yellow. Drink small amounts of fluids often.  Ask your doctor how to replace body fluid losses (rehydration).  Avoid:  Foods high in sugar.  Alcohol.  Bubbly (carbonated) drinks.  Tobacco.  Juice.  Caffeine drinks.  Very hot or cold fluids.  Fatty, greasy foods.  Eating too much at one time.  Dairy products until 24 to 48 hours after your watery poop stops.  You may eat foods with active cultures (probiotics). They can be found in some yogurts and supplements.  Wash your hands well to avoid spreading the illness.  Only take medicines as told by your doctor. Do not give aspirin to children. Do not take medicines for watery poop (antidiarrheals).  Ask your doctor if you should keep taking your regular medicines.  Keep all doctor visits as told. GET HELP RIGHT AWAY IF:   You cannot keep fluids down.  You do not pee at least once every 6 to 8 hours.  You are short of breath.  You see blood in your poop or throw up. This may look like coffee grounds.  You have belly (abdominal) pain that gets worse or is just in one small spot (localized).  You keep throwing up or having watery poop.  You have a fever.  The patient is a child younger than 3 months, and he or she has a fever.  The patient is a child older than 3 months, and he or she has a fever and problems that do not go away.  The  patient is a child older than 3 months, and he or she has a fever and problems that suddenly get worse.  The patient is a baby, and he or she has no tears when crying. MAKE SURE YOU:   Understand these instructions.  Will watch your condition.  Will get help right away if you are not doing well or get worse.   This information is not intended to replace advice given to you by your health care provider. Make sure you discuss any questions you have with your health care provider.   Document Released: 05/26/2008 Document Revised: 03/01/2012 Document Reviewed: 09/24/2011 Elsevier Interactive Patient Education [employer].

## 2016-04-08 ENCOUNTER — Other Ambulatory Visit: Payer: Self-pay | Admitting: Family Medicine

## 2016-04-17 ENCOUNTER — Encounter: Payer: Self-pay | Admitting: Family Medicine

## 2016-04-17 ENCOUNTER — Ambulatory Visit (INDEPENDENT_AMBULATORY_CARE_PROVIDER_SITE_OTHER): Payer: 59 | Admitting: Family Medicine

## 2016-04-17 VITALS — BP 132/81 | HR 83 | Temp 98.1°F | Resp 17 | Ht 75.0 in | Wt 235.6 lb

## 2016-04-17 DIAGNOSIS — E785 Hyperlipidemia, unspecified: Secondary | ICD-10-CM

## 2016-04-17 DIAGNOSIS — I10 Essential (primary) hypertension: Secondary | ICD-10-CM | POA: Diagnosis not present

## 2016-04-17 DIAGNOSIS — R739 Hyperglycemia, unspecified: Secondary | ICD-10-CM | POA: Diagnosis not present

## 2016-04-17 LAB — POCT GLYCOSYLATED HEMOGLOBIN (HGB A1C): Hemoglobin A1C: 5.8

## 2016-04-17 NOTE — Progress Notes (Signed)
Name: Jonathon Snow   MRN: [id number]    DOB: Nov 03, 1961   Date:04/17/2016       Progress Note  Subjective  Chief Complaint  Chief Complaint  Patient presents with  . Follow-up    3 mo    Hyperlipidemia This is a chronic problem. The problem is uncontrolled. Recent lipid tests were reviewed and are high. Pertinent negatives include no chest pain. Current antihyperlipidemic treatment includes diet change and exercise (No other physical activity besides work.).  Hypertension This is a chronic problem. The problem is controlled. Pertinent negatives include no blurred vision, chest pain or headaches. Past treatments include angiotensin blockers. Improvement on treatment: Losartan dosage was increased recently by Nephrologist. There are no compliance problems.     Past Medical History  Diagnosis Date  . Hypertension   . Pulmonary embolism ([place])   . DVT (deep venous thrombosis) ([place])   . Hyperlipidemia   . Lupus ([place])   . Renal disorder     Past Surgical History  Procedure Laterality Date  . Cyst excision  92 or 93     Liver cyst removal [facility]  . Ankle surgery Right     History reviewed. No pertinent family history.  Social History   Social History  . Marital Status: Divorced    Spouse Name: N/A  . Number of Children: N/A  . Years of Education: N/A   Occupational History  . Not on file.   Social History Main Topics  . Smoking status: Never Smoker   . Smokeless tobacco: Not on file  . Alcohol Use: No  . Drug Use: Not on file  . Sexual Activity: Not on file   Other Topics Concern  . Not on file   Social History Narrative     Current outpatient prescriptions:  .  losartan (COZAAR) 50 MG tablet, Take 50 mg by mouth daily., Disp: , Rfl: 6 .  LYRICA 150 MG capsule, Take 150 mg by mouth 2 (two) times daily., Disp: , Rfl: 5 .  Multiple Vitamins-Minerals (MULTIVITAMIN WITH MINERALS) tablet, Take 1 tablet by mouth daily., Disp: , Rfl:  .  prednisoLONE 5 MG TABS  tablet, Take 5 mg by mouth daily., Disp: , Rfl:  .  warfarin (COUMADIN) 1 MG tablet, Take 1 mg by mouth daily. Take 3 tablets (1 mg each) on Saturday and Sunday along with the 5 mg tablets to equal 8 mg., Disp: , Rfl:  .  warfarin (COUMADIN) 1 MG tablet, TAKE 1 TABLET ON MONDAY WEDNESDAY AND FRIDAY WITH A 5 MG TABLET, Disp: 15 tablet, Rfl: 0 .  warfarin (COUMADIN) 5 MG tablet, 5mg  daily on Mondays-Fridays,  Take a 3 tablets (1 mg each) on Saturday and Sundays with the 5 mg tablet. This will equal a total dose of 6 mg (5 days a wk) and 8 mg on Saturday and Sunday., Disp: 30 tablet, Rfl: 6  Allergies  Allergen Reactions  . Vicodin [Hydrocodone-Acetaminophen] Hives and Other (See Comments)    Rash/hives Other Reaction: severe headaches Rash/hives Other Reaction: severe headaches Rash/hives  . Hydrocodone Rash  . Oxycodone Rash    Takes occasionally.     Review of Systems  Constitutional: Negative for fever and chills.  Eyes: Negative for blurred vision and double vision.  Cardiovascular: Negative for chest pain.  Gastrointestinal: Negative for abdominal pain.  Neurological: Negative for headaches.     Objective  Filed Vitals:   04/17/16 0830  BP: 132/81  Pulse: 83  Temp: 98.1 F (  36.7 C)  TempSrc: Oral  Resp: 17  Height: 6\' 3"  (1.905 m)  Weight: 235 lb 9.6 oz (106.867 kg)  SpO2: 96%    Physical Exam  Constitutional: He is oriented to person, place, and time and well-developed, well-nourished, and in no distress.  HENT:  Head: Normocephalic and atraumatic.  Cardiovascular: Normal rate and regular rhythm.   Pulmonary/Chest: Effort normal and breath sounds normal.  Abdominal: Soft. Bowel sounds are normal.  Neurological: He is alert and oriented to person, place, and time.  Skin: Skin is warm and dry.  Nursing note and vitals reviewed.   Assessment & Plan  1. Essential hypertension Blood pressure is at goal, continue present therapy  2. Hyperlipidemia Elevated  total and LDL cholesterol in January 2017, recheck and consider initiation of statin therapy - Lipid Profile - Comprehensive Metabolic Panel (CMET)  3. Hyperglycemia  - POCT HgB A1C   Jonathon Snow [employer] 04/17/2016 8:52 AM

## 2016-04-18 LAB — LIPID PANEL
CHOLESTEROL TOTAL: 208 mg/dL — AB (ref 100–199)
Chol/HDL Ratio: 4.3 ratio units (ref 0.0–5.0)
HDL: 48 mg/dL (ref >39)
LDL CALC: 147 mg/dL — AB (ref 0–99)
Triglycerides: 66 mg/dL (ref 0–149)
VLDL CHOLESTEROL CAL: 13 mg/dL (ref 5–40)

## 2016-04-18 LAB — COMPREHENSIVE METABOLIC PANEL
A/G RATIO: 1.3 (ref 1.2–2.2)
ALK PHOS: 78 IU/L (ref 39–117)
ALT: 14 IU/L (ref 0–44)
AST: 11 IU/L (ref 0–40)
Albumin: 3.9 g/dL (ref 3.5–5.5)
BILIRUBIN TOTAL: 0.4 mg/dL (ref 0.0–1.2)
BUN/Creatinine Ratio: 19 (ref 9–20)
BUN: 23 mg/dL (ref 6–24)
CHLORIDE: 102 mmol/L (ref 96–106)
CO2: 22 mmol/L (ref 18–29)
CREATININE: 1.22 mg/dL (ref 0.76–1.27)
Calcium: 9 mg/dL (ref 8.7–10.2)
GFR calc Af Amer: 77 mL/min/{1.73_m2} (ref >59)
GFR calc non Af Amer: 67 mL/min/{1.73_m2} (ref >59)
GLOBULIN, TOTAL: 3 g/dL (ref 1.5–4.5)
Glucose: 88 mg/dL (ref 65–99)
POTASSIUM: 4.4 mmol/L (ref 3.5–5.2)
SODIUM: 141 mmol/L (ref 134–144)
Total Protein: 6.9 g/dL (ref 6.0–8.5)

## 2016-04-24 ENCOUNTER — Inpatient Hospital Stay: Payer: 59 | Attending: Internal Medicine

## 2016-04-24 ENCOUNTER — Inpatient Hospital Stay (HOSPITAL_BASED_OUTPATIENT_CLINIC_OR_DEPARTMENT_OTHER): Payer: 59 | Admitting: Internal Medicine

## 2016-04-24 VITALS — BP 142/85 | HR 62 | Temp 97.1°F | Resp 18 | Wt 238.1 lb

## 2016-04-24 DIAGNOSIS — N059 Unspecified nephritic syndrome with unspecified morphologic changes: Secondary | ICD-10-CM | POA: Insufficient documentation

## 2016-04-24 DIAGNOSIS — I10 Essential (primary) hypertension: Secondary | ICD-10-CM

## 2016-04-24 DIAGNOSIS — Z7901 Long term (current) use of anticoagulants: Secondary | ICD-10-CM | POA: Diagnosis not present

## 2016-04-24 DIAGNOSIS — Z79899 Other long term (current) drug therapy: Secondary | ICD-10-CM | POA: Insufficient documentation

## 2016-04-24 DIAGNOSIS — I82401 Acute embolism and thrombosis of unspecified deep veins of right lower extremity: Secondary | ICD-10-CM

## 2016-04-24 DIAGNOSIS — Z86711 Personal history of pulmonary embolism: Secondary | ICD-10-CM | POA: Diagnosis not present

## 2016-04-24 DIAGNOSIS — M329 Systemic lupus erythematosus, unspecified: Secondary | ICD-10-CM

## 2016-04-24 DIAGNOSIS — Z7952 Long term (current) use of systemic steroids: Secondary | ICD-10-CM | POA: Insufficient documentation

## 2016-04-24 DIAGNOSIS — E785 Hyperlipidemia, unspecified: Secondary | ICD-10-CM | POA: Insufficient documentation

## 2016-04-24 DIAGNOSIS — Z86718 Personal history of other venous thrombosis and embolism: Secondary | ICD-10-CM | POA: Diagnosis not present

## 2016-04-24 DIAGNOSIS — I2699 Other pulmonary embolism without acute cor pulmonale: Secondary | ICD-10-CM

## 2016-04-24 LAB — PROTIME-INR
INR: 1.79
Prothrombin Time: 20.8 seconds — ABNORMAL HIGH (ref 11.4–15.0)

## 2016-04-24 MED ORDER — APIXABAN 5 MG PO TABS: 5.0000 mg | ORAL_TABLET | Freq: Two times a day (BID) | ORAL | Status: DC

## 2016-04-24 NOTE — Patient Instructions (Signed)
Apixaban oral tablets °What is this medicine? °APIXABAN (a PIX a ban) is an anticoagulant (blood thinner). It is used to lower the chance of stroke in people with a medical condition called atrial fibrillation. It is also used to treat or prevent blood clots in the lungs or in the veins. °This medicine may be used for other purposes; ask your health care provider or pharmacist if you have questions. °What should I tell my health care provider before I take this medicine? °They need to know if you have any of these conditions: °-bleeding disorders °-bleeding in the brain °-blood in your stools (black or tarry stools) or if you have blood in your vomit °-history of stomach bleeding °-kidney disease °-liver disease °-mechanical heart valve °-an unusual or allergic reaction to apixaban, other medicines, foods, dyes, or preservatives °-pregnant or trying to get pregnant °-breast-feeding °How should I use this medicine? °Take this medicine by mouth with a glass of water. Follow the directions on the prescription label. You can take it with or without food. If it upsets your stomach, take it with food. Take your medicine at regular intervals. Do not take it more often than directed. Do not stop taking except on your doctor's advice. Stopping this medicine may increase your risk of a blot clot. Be sure to refill your prescription before you run out of medicine. °Talk to your pediatrician regarding the use of this medicine in children. Special care may be needed. °Overdosage: If you think you have taken too much of this medicine contact a poison control center or emergency room at once. °NOTE: This medicine is only for you. Do not share this medicine with others. °What if I miss a dose? °If you miss a dose, take it as soon as you can. If it is almost time for your next dose, take only that dose. Do not take double or extra doses. °What may interact with this medicine? °This medicine may interact with the following: °-aspirin  and aspirin-like medicines °-certain medicines for fungal infections like ketoconazole and itraconazole °-certain medicines for seizures like carbamazepine and phenytoin °-certain medicines that treat or prevent blood clots like warfarin, enoxaparin, and dalteparin °-clarithromycin °-NSAIDs, medicines for pain and inflammation, like ibuprofen or naproxen °-rifampin °-ritonavir °-St. John's wort °This list may not describe all possible interactions. Give your health care provider a list of all the medicines, herbs, non-prescription drugs, or dietary supplements you use. Also tell them if you smoke, drink alcohol, or use illegal drugs. Some items may interact with your medicine. °What should I watch for while using this medicine? °Notify your doctor or health care professional and seek emergency treatment if you develop breathing problems; changes in vision; chest pain; severe, sudden headache; pain, swelling, warmth in the leg; trouble speaking; sudden numbness or weakness of the face, arm, or leg. These can be signs that your condition has gotten worse. °If you are going to have surgery, tell your doctor or health care professional that you are taking this medicine. °Tell your health care professional that you use this medicine before you have a spinal or epidural procedure. Sometimes people who take this medicine have bleeding problems around the spine when they have a spinal or epidural procedure. This bleeding is very rare. If you have a spinal or epidural procedure while on this medicine, call your health care professional immediately if you have back pain, numbness or tingling (especially in your legs and feet), muscle weakness, paralysis, or loss of bladder or bowel   control. °Avoid sports and activities that might cause injury while you are using this medicine. Severe falls or injuries can cause unseen bleeding. Be careful when using sharp tools or knives. Consider using an electric razor. Take special care  brushing or flossing your teeth. Report any injuries, bruising, or red spots on the skin to your doctor or health care professional. °What side effects may I notice from receiving this medicine? °Side effects that you should report to your doctor or health care professional as soon as possible: °-allergic reactions like skin rash, itching or hives, swelling of the face, lips, or tongue °-signs and symptoms of bleeding such as bloody or black, tarry stools; red or dark-brown urine; spitting up blood or brown material that looks like coffee grounds; red spots on the skin; unusual bruising or bleeding from the eye, gums, or nose °This list may not describe all possible side effects. Call your doctor for medical advice about side effects. You may report side effects to FDA at 1-800-FDA-1088. °Where should I keep my medicine? °Keep out of the reach of children. °Store at room temperature between 20 and 25 degrees C (68 and 77 degrees F). Throw away any unused medicine after the expiration date. °NOTE: This sheet is a summary. It may not cover all possible information. If you have questions about this medicine, talk to your doctor, pharmacist, or health care provider. °  °© 2016, Elsevier/[employer]. (2013-08-12 11:59:24) ° °

## 2016-04-24 NOTE — Progress Notes (Signed)
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 Patient seems to be tolerating Coumadin fairly well except for intermittent nosebleeds. He denies any significant nosebleeds.  Patient denies any blood in stools black red stools. He is interested in Eliquis. This is okay from nephrology standpoint. No new blood clots no swelling in the legs.  REVIEW OF SYSTEMS:  A complete 10 point review of system is done which is negative except mentioned above/history of present illness.   PAST MEDICAL HISTORY :  Past Medical History  Diagnosis Date  . Hypertension   . Pulmonary embolism ([place])   . DVT (deep venous thrombosis) ([place])   . Hyperlipidemia   . Lupus ([place])   . Renal disorder     PAST SURGICAL HISTORY :   Past Surgical History  Procedure Laterality Date  . Cyst excision  92 or 93     Liver cyst removal [facility]  . Ankle surgery Right     FAMILY HISTORY :  No family history on file.  SOCIAL HISTORY:   Social History  Substance Use Topics  . Smoking status: Never Smoker   . Smokeless tobacco: Not on file  . Alcohol Use: No    ALLERGIES:  is allergic to vicodin; hydrocodone; and oxycodone.  MEDICATIONS:  Current Outpatient Prescriptions  Medication Sig Dispense Refill  . losartan (COZAAR) 50 MG tablet Take 50 mg by mouth daily.  6  . Multiple Vitamins-Minerals (MULTIVITAMIN WITH MINERALS) tablet Take 1 tablet by mouth daily.    . prednisoLONE 5 MG TABS tablet Take 5 mg by mouth daily.    [name] warfarin (COUMADIN) 1 MG tablet Take 1 mg by mouth daily. Take 3 tablets (1 mg each) on Saturday and Sunday along  with the 5 mg tablets to equal 8 mg.    . warfarin (COUMADIN) 5 MG tablet 5mg  daily on Mondays-Fridays,  Take a 3 tablets (1 mg each) on Saturday and Sundays with the 5 mg tablet. This will equal a total dose of 6 mg (5 days a wk) and 8 mg on Saturday and Sunday. 30 tablet 6  . LYRICA 150 MG capsule Take 150 mg by mouth 2 (two) times daily. Reported on 04/24/2016  5   No current facility-administered medications for this visit.    PHYSICAL EXAMINATION: ECOG PERFORMANCE STATUS: 1 - Symptomatic but completely ambulatory  BP 142/85 mmHg  Pulse 62  Temp(Src) 97.1 F (36.2 C) (Tympanic)  Wt 238 lb 1.6 oz (108 kg)  Filed Weights   04/24/16 1133  Weight: 238 lb 1.6 oz (108 kg)    GENERAL: Well-nourished well-developed; Alert, no distress and comfortable.  Alone.  EYES: no pallor or icterus OROPHARYNX: no thrush or ulceration; good dentition  NECK: supple, no masses felt LYMPH:  no palpable lymphadenopathy in the cervical, axillary or inguinal regions LUNGS: clear to auscultation and  No wheeze or crackles HEART/CVS: regular rate & rhythm and no murmurs; No lower extremity edema ABDOMEN:abdomen soft, non-tender and normal bowel sounds Musculoskeletal:no cyanosis of digits and no clubbing  PSYCH: alert & oriented x 3 with fluent speech NEURO: no focal motor/sensory deficits  SKIN: Ecchymosis noted. No active bleeding.  LABORATORY DATA:  I have reviewed the data as listed    Component Value Date/Time   NA 141 04/17/2016 0924   NA 128* 03/29/2016 0909   NA 138 04/10/2015 1036   K 4.4 04/17/2016 0924   K 3.5 04/10/2015 1036   CL 102 04/17/2016 0924   CL 108 04/10/2015 1036   CO2 22 04/17/2016 0924   CO2 25 04/10/2015 1036   GLUCOSE 88 04/17/2016 0924   GLUCOSE 129* 03/29/2016 0909   GLUCOSE 96 04/10/2015 1036   BUN 23 04/17/2016 0924   BUN 23* 03/29/2016 0909   BUN 16 04/10/2015 1036   CREATININE 1.22 04/17/2016 0924   CREATININE 1.34* 04/10/2015 1036   CALCIUM 9.0 04/17/2016  0924   CALCIUM 8.4* 04/10/2015 1036   PROT 6.9 04/17/2016 0924   PROT 7.7 03/29/2016 0909   PROT 7.0 04/10/2015 1036   ALBUMIN 3.9 04/17/2016 0924   ALBUMIN 3.9 03/29/2016 0909   ALBUMIN 3.9 04/10/2015 1036   AST 11 04/17/2016 0924   AST 19 04/10/2015 1036   ALT 14 04/17/2016 0924   ALT 16* 04/10/2015 1036   ALKPHOS 78 04/17/2016 0924   ALKPHOS 71 04/10/2015 1036   BILITOT 0.4 04/17/2016 0924   BILITOT 1.5* 03/29/2016 0909   BILITOT 0.7 04/10/2015 1036   GFRNONAA 67 04/17/2016 0924   GFRNONAA >60 04/10/2015 1036   GFRNONAA 32* 10/05/2014 1545   GFRAA 77 04/17/2016 0924   GFRAA >60 04/10/2015 1036   GFRAA 39* 10/05/2014 1545    No results found for: SPEP, UPEP  Lab Results  Component Value Date   WBC 13.6* 03/29/2016   NEUTROABS 11.7* 03/29/2016   HGB 14.1 03/29/2016   HCT 42.3 03/29/2016   MCV 87.2 03/29/2016   PLT 127* 03/29/2016      Chemistry      Component Value Date/Time   NA 141 04/17/2016 0924   NA 128* 03/29/2016 0909   NA 138 04/10/2015 1036   K 4.4 04/17/2016 0924   K 3.5 04/10/2015 1036   CL 102 04/17/2016 0924   CL 108 04/10/2015 1036   CO2 22 04/17/2016 0924   CO2 25 04/10/2015 1036   BUN 23 04/17/2016 0924   BUN 23* 03/29/2016 0909   BUN 16 04/10/2015 1036   CREATININE 1.22 04/17/2016 0924   CREATININE 1.34* 04/10/2015 1036      Component Value Date/Time   CALCIUM 9.0 04/17/2016 0924   CALCIUM 8.4* 04/10/2015 1036   ALKPHOS 78 04/17/2016 0924   ALKPHOS 71 04/10/2015 1036   AST 11 04/17/2016 0924   AST 19 04/10/2015 1036   ALT 14 04/17/2016 0924   ALT 16* 04/10/2015 1036   BILITOT 0.4 04/17/2016 0924   BILITOT 1.5* 03/29/2016 0909   BILITOT 0.7 04/10/2015 1036         ASSESSMENT & PLAN:   # DVT PE 2- patient on anticoagulation with Coumadin. Indefinite period. INR today was 1.79. Continue the patient on  Patient is on 5 mg once a day. I discussed the option of  ELIQUIS. Discussed the importance of avoiding falls; and also lack  of reversal. In terms of efficacy this should be as good as Coumadin if not better in avoiding blood clots. We will start the patient on 5 mg twice a day when and if available.Await insurance approval.  # History of nephritis/lupus- following up with nephrology.  # Check PT/INR monthly basis [This could be discontinued if patient goes on  Eliquis]; follow-up with me in 6 months with CBC CMP PT/INR.     Cammie Sickle, MD 04/24/2016 11:39 AM

## 2016-04-24 NOTE — Progress Notes (Signed)
Patient brought to exam room 4.  Patient denies pain or discomfort, vitals documented.  Medical record updated information provided by patient.

## 2016-05-07 ENCOUNTER — Ambulatory Visit: Payer: 59 | Admitting: Family Medicine

## 2016-05-13 ENCOUNTER — Encounter: Payer: Self-pay | Admitting: Family Medicine

## 2016-05-13 ENCOUNTER — Ambulatory Visit (INDEPENDENT_AMBULATORY_CARE_PROVIDER_SITE_OTHER): Payer: 59 | Admitting: Family Medicine

## 2016-05-13 VITALS — BP 139/91 | HR 78 | Temp 98.0°F | Resp 15 | Ht 75.0 in | Wt 240.4 lb

## 2016-05-13 DIAGNOSIS — Z9889 Other specified postprocedural states: Secondary | ICD-10-CM | POA: Diagnosis not present

## 2016-05-13 DIAGNOSIS — Z86010 Personal history of colonic polyps: Secondary | ICD-10-CM | POA: Diagnosis not present

## 2016-05-13 NOTE — Progress Notes (Signed)
Name: Jonathon Snow   MRN: [id number]    DOB: 1961-02-09   Date:05/13/2016       Progress Note  Subjective  Chief Complaint  Chief Complaint  Patient presents with  . Follow-up    Discuss colonoscopy referral    HPI  Pt. Is here for a colonoscopy referral. Last colonoscopy was in 2010 by Dr. Gustavo Lah, records not available.   Past Medical History  Diagnosis Date  . Hypertension   . Pulmonary embolism ([place])   . DVT (deep venous thrombosis) ([place])   . Hyperlipidemia   . Lupus ([place])   . Renal disorder     Past Surgical History  Procedure Laterality Date  . Cyst excision  92 or 93     Liver cyst removal [facility]  . Ankle surgery Right     History reviewed. No pertinent family history.  Social History   Social History  . Marital Status: Divorced    Spouse Name: N/A  . Number of Children: N/A  . Years of Education: N/A   Occupational History  . Not on file.   Social History Main Topics  . Smoking status: Never Smoker   . Smokeless tobacco: Not on file  . Alcohol Use: No  . Drug Use: Not on file  . Sexual Activity: Not on file   Other Topics Concern  . Not on file   Social History Narrative     Current outpatient prescriptions:  .  apixaban (ELIQUIS) 5 MG TABS tablet, Take 1 tablet (5 mg total) by mouth 2 (two) times daily., Disp: 60 tablet, Rfl: 5 .  losartan (COZAAR) 50 MG tablet, Take 50 mg by mouth daily., Disp: , Rfl: 6 .  LYRICA 150 MG capsule, Take 150 mg by mouth 2 (two) times daily. Reported on 04/24/2016, Disp: , Rfl: 5 .  Multiple Vitamins-Minerals (MULTIVITAMIN WITH MINERALS) tablet, Take 1 tablet by mouth daily., Disp: , Rfl:  .  prednisoLONE 5 MG TABS tablet, Take 5 mg by mouth daily., Disp: , Rfl:   Allergies  Allergen Reactions  . Vicodin [Hydrocodone-Acetaminophen] Hives and Other (See Comments)    Rash/hives Other Reaction: severe headaches Rash/hives Other Reaction: severe headaches Rash/hives  . Hydrocodone Rash  . Oxycodone Rash   Takes occasionally.     Review of Systems  Gastrointestinal: Negative for abdominal pain, diarrhea, constipation, blood in stool and melena.    Objective  Filed Vitals:   05/13/16 0938  BP: 139/91  Pulse: 78  Temp: 98 F (36.7 C)  TempSrc: Oral  Resp: 15  Height: 6\' 3"  (1.905 m)  Weight: 240 lb 6.4 oz (109.045 kg)  SpO2: 98%    Physical Exam  Constitutional: He is oriented to person, place, and time and well-developed, well-nourished, and in no distress.  Neurological: He is alert and oriented to person, place, and time.  Nursing note and vitals reviewed.     Assessment & Plan  1. History of colonoscopy with polypectomy Oriented colonoscopy with some polyps removed. Records not available. Will refer to gastroenterology. - Ambulatory referral to Gastroenterology   Dossie Der [name] 05/13/2016 10:09 AM

## 2016-05-26 ENCOUNTER — Inpatient Hospital Stay: Payer: 59 | Attending: Internal Medicine

## 2016-06-27 ENCOUNTER — Other Ambulatory Visit: Payer: 59

## 2016-06-27 ENCOUNTER — Inpatient Hospital Stay: Payer: 59 | Attending: Internal Medicine

## 2016-06-27 DIAGNOSIS — I2699 Other pulmonary embolism without acute cor pulmonale: Secondary | ICD-10-CM

## 2016-06-27 DIAGNOSIS — Z86718 Personal history of other venous thrombosis and embolism: Secondary | ICD-10-CM | POA: Insufficient documentation

## 2016-06-27 DIAGNOSIS — Z5181 Encounter for therapeutic drug level monitoring: Secondary | ICD-10-CM | POA: Diagnosis not present

## 2016-06-27 DIAGNOSIS — Z7901 Long term (current) use of anticoagulants: Secondary | ICD-10-CM | POA: Insufficient documentation

## 2016-06-27 DIAGNOSIS — Z86711 Personal history of pulmonary embolism: Secondary | ICD-10-CM | POA: Insufficient documentation

## 2016-06-27 LAB — PROTIME-INR
INR: 1.02
Prothrombin Time: 13.6 seconds (ref 11.4–15.0)

## 2016-06-30 ENCOUNTER — Telehealth: Payer: Self-pay | Admitting: *Deleted

## 2016-06-30 NOTE — Telephone Encounter (Signed)
Attempted to call patient.  No answer and no availability to leave voice message.

## 2016-06-30 NOTE — Telephone Encounter (Signed)
-----   Message from Cammie Sickle, MD sent at 06/30/2016  7:48 AM EDT ----- Please check if pt is on coumadin or on eliquis?- if on coumadin- please confirm the dose; he needs adjustment of coumadin dosing.

## 2016-06-30 NOTE — Telephone Encounter (Signed)
Per md, if patient is on Eliquis, no need to check his pt/inr-these future apts could be cnl.

## 2016-07-01 ENCOUNTER — Other Ambulatory Visit: Payer: Self-pay | Admitting: *Deleted

## 2016-07-01 DIAGNOSIS — Z86711 Personal history of pulmonary embolism: Secondary | ICD-10-CM

## 2016-07-01 NOTE — Telephone Encounter (Signed)
msg sent to cancer center scheduling to cnl all future lab apts for the next 3 months. Pt is currently on eliquis. Pt/inr/ checks not medically necessary.

## 2016-07-28 ENCOUNTER — Other Ambulatory Visit: Payer: 59

## 2016-08-08 ENCOUNTER — Ambulatory Visit: Payer: 59 | Admitting: Family Medicine

## 2016-08-12 ENCOUNTER — Ambulatory Visit: Payer: 59 | Admitting: Family Medicine

## 2016-08-29 ENCOUNTER — Other Ambulatory Visit: Payer: 59

## 2016-09-29 ENCOUNTER — Other Ambulatory Visit: Payer: 59

## 2016-10-17 ENCOUNTER — Ambulatory Visit (INDEPENDENT_AMBULATORY_CARE_PROVIDER_SITE_OTHER): Payer: 59 | Admitting: Family Medicine

## 2016-10-17 ENCOUNTER — Encounter: Payer: Self-pay | Admitting: Family Medicine

## 2016-10-17 VITALS — BP 143/77 | HR 83 | Temp 97.5°F | Resp 17 | Ht 75.0 in | Wt 240.2 lb

## 2016-10-17 DIAGNOSIS — E785 Hyperlipidemia, unspecified: Secondary | ICD-10-CM | POA: Diagnosis not present

## 2016-10-17 DIAGNOSIS — I10 Essential (primary) hypertension: Secondary | ICD-10-CM

## 2016-10-17 NOTE — Progress Notes (Signed)
Name: Jonathon Snow   MRN: [id number]    DOB: 02/16/1961   Date:10/17/2016       Progress Note  Subjective  Chief Complaint  Chief Complaint  Patient presents with  . Follow-up    6 mo    Hypertension  This is a chronic problem. The problem is unchanged. The problem is uncontrolled. Pertinent negatives include no blurred vision, chest pain, headaches, palpitations or shortness of breath. Past treatments include angiotensin blockers (Followed by nephrology, losartan was increased to 100 mg by Dr. Holley Raring). Hypertensive end-organ damage includes kidney disease. There is no history of CAD/MI or CVA.  Hyperlipidemia  This is a chronic problem. The problem is controlled. Recent lipid tests were reviewed and are high. Pertinent negatives include no chest pain or shortness of breath. He is currently on no antihyperlipidemic treatment.     Past Medical History:  Diagnosis Date  . DVT (deep venous thrombosis) ([place])   . Hyperlipidemia   . Hypertension   . Lupus   . Pulmonary embolism ([place])   . Renal disorder     Past Surgical History:  Procedure Laterality Date  . ANKLE SURGERY Right   . CYST EXCISION  92 or 93    Liver cyst removal [facility]    No family history on file.  Social History   Social History  . Marital status: Divorced    Spouse name: N/A  . Number of children: N/A  . Years of education: N/A   Occupational History  . Not on file.   Social History Main Topics  . Smoking status: Never Smoker  . Smokeless tobacco: Never Used  . Alcohol use No  . Drug use: Unknown  . Sexual activity: Not on file   Other Topics Concern  . Not on file   Social History Narrative  . No narrative on file     Current Outpatient Prescriptions:  .  apixaban (ELIQUIS) 5 MG TABS tablet, Take 1 tablet (5 mg total) by mouth 2 (two) times daily., Disp: 60 tablet, Rfl: 5 .  losartan (COZAAR) 50 MG tablet, Take 50 mg by mouth daily., Disp: , Rfl: 6 .  LYRICA 150 MG capsule, Take 150 mg  by mouth 2 (two) times daily. Reported on 04/24/2016, Disp: , Rfl: 5 .  Multiple Vitamins-Minerals (MULTIVITAMIN WITH MINERALS) tablet, Take 1 tablet by mouth daily., Disp: , Rfl:  .  prednisoLONE 5 MG TABS tablet, Take 5 mg by mouth daily., Disp: , Rfl:   Allergies  Allergen Reactions  . Vicodin [Hydrocodone-Acetaminophen] Hives and Other (See Comments)    Rash/hives Other Reaction: severe headaches Rash/hives Other Reaction: severe headaches Rash/hives  . Hydrocodone Rash  . Oxycodone Rash    Takes occasionally.     Review of Systems  Eyes: Negative for blurred vision.  Respiratory: Negative for shortness of breath.   Cardiovascular: Negative for chest pain and palpitations.  Neurological: Negative for headaches.      Objective  Vitals:   10/17/16 0815  BP: (!) 143/77  Pulse: 83  Resp: 17  Temp: 97.5 F (36.4 C)  TempSrc: Oral  SpO2: 97%  Weight: 240 lb 3.2 oz (109 kg)  Height: 6\' 3"  (1.905 m)    Physical Exam  Constitutional: He is oriented to person, place, and time and well-developed, well-nourished, and in no distress.  HENT:  Head: Normocephalic and atraumatic.  Cardiovascular: Normal rate, regular rhythm, S1 normal, S2 normal and normal heart sounds.   No murmur heard. Pulmonary/Chest: Effort  normal and breath sounds normal. He has no wheezes. He has no rhonchi.  Neurological: He is alert and oriented to person, place, and time.  Psychiatric: Mood, memory, affect and judgment normal.  Nursing note and vitals reviewed.    Assessment & Plan  1. Essential hypertension Systolic blood pressure marginally elevated, now on losartan 100 mg daily, being managed by nephrology because of lupus.  2. Hyperlipidemia, unspecified hyperlipidemia type Obtain FLP and consider starting patient on statin therapy - Lipid Profile - COMPLETE METABOLIC PANEL WITH GFR   Jonathon Snow [name] 10/17/2016 8:28 AM

## 2016-10-29 ENCOUNTER — Inpatient Hospital Stay: Payer: 59 | Attending: Internal Medicine | Admitting: Internal Medicine

## 2016-10-29 ENCOUNTER — Inpatient Hospital Stay (HOSPITAL_BASED_OUTPATIENT_CLINIC_OR_DEPARTMENT_OTHER): Payer: 59

## 2016-10-29 DIAGNOSIS — I10 Essential (primary) hypertension: Secondary | ICD-10-CM | POA: Diagnosis not present

## 2016-10-29 DIAGNOSIS — Z86718 Personal history of other venous thrombosis and embolism: Secondary | ICD-10-CM

## 2016-10-29 DIAGNOSIS — Z7952 Long term (current) use of systemic steroids: Secondary | ICD-10-CM

## 2016-10-29 DIAGNOSIS — Z86711 Personal history of pulmonary embolism: Secondary | ICD-10-CM

## 2016-10-29 DIAGNOSIS — D696 Thrombocytopenia, unspecified: Secondary | ICD-10-CM | POA: Diagnosis not present

## 2016-10-29 DIAGNOSIS — Z7901 Long term (current) use of anticoagulants: Secondary | ICD-10-CM

## 2016-10-29 DIAGNOSIS — M329 Systemic lupus erythematosus, unspecified: Secondary | ICD-10-CM

## 2016-10-29 DIAGNOSIS — I825Y2 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: Secondary | ICD-10-CM

## 2016-10-29 DIAGNOSIS — Z79899 Other long term (current) drug therapy: Secondary | ICD-10-CM | POA: Diagnosis not present

## 2016-10-29 DIAGNOSIS — E785 Hyperlipidemia, unspecified: Secondary | ICD-10-CM | POA: Diagnosis not present

## 2016-10-29 LAB — COMPREHENSIVE METABOLIC PANEL
ALK PHOS: 69 U/L (ref 38–126)
ALT: 20 U/L (ref 17–63)
ANION GAP: 5 (ref 5–15)
AST: 19 U/L (ref 15–41)
Albumin: 3.9 g/dL (ref 3.5–5.0)
BILIRUBIN TOTAL: 0.6 mg/dL (ref 0.3–1.2)
BUN: 24 mg/dL — ABNORMAL HIGH (ref 6–20)
CALCIUM: 8.7 mg/dL — AB (ref 8.9–10.3)
CO2: 28 mmol/L (ref 22–32)
Chloride: 105 mmol/L (ref 101–111)
Creatinine, Ser: 1.21 mg/dL (ref 0.61–1.24)
Glucose, Bld: 75 mg/dL (ref 65–99)
POTASSIUM: 3.9 mmol/L (ref 3.5–5.1)
Sodium: 138 mmol/L (ref 135–145)
TOTAL PROTEIN: 7.3 g/dL (ref 6.5–8.1)

## 2016-10-29 LAB — CBC WITH DIFFERENTIAL/PLATELET
BASOS PCT: 1 %
Basophils Absolute: 0 10*3/uL (ref 0–0.1)
Eosinophils Absolute: 0.2 10*3/uL (ref 0–0.7)
Eosinophils Relative: 5 %
HEMATOCRIT: 46.4 % (ref 40.0–52.0)
HEMOGLOBIN: 15.7 g/dL (ref 13.0–18.0)
LYMPHS ABS: 0.9 10*3/uL — AB (ref 1.0–3.6)
LYMPHS PCT: 25 %
MCH: 30.8 pg (ref 26.0–34.0)
MCHC: 33.8 g/dL (ref 32.0–36.0)
MCV: 90.9 fL (ref 80.0–100.0)
MONO ABS: 0.5 10*3/uL (ref 0.2–1.0)
MONOS PCT: 14 %
NEUTROS ABS: 2.1 10*3/uL (ref 1.4–6.5)
Neutrophils Relative %: 55 %
Platelets: 135 10*3/uL — ABNORMAL LOW (ref 150–440)
RBC: 5.1 MIL/uL (ref 4.40–5.90)
RDW: 14.2 % (ref 11.5–14.5)
WBC: 3.7 10*3/uL — ABNORMAL LOW (ref 3.8–10.6)

## 2016-10-29 NOTE — Assessment & Plan Note (Signed)
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 Tolerating well.   # Mild chronic intermittent thrombocytopenia- ? Sec to immune process- monitor for now.   # History of nephritis/lupus- following up with nephrology.  # follow up in 6 months/labs 

## 2016-10-29 NOTE — Progress Notes (Signed)
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   Patient denies any blood in stools black red stools.  No new blood clots no swelling in the legs. No falls.   REVIEW OF SYSTEMS:  A complete 10 point review of system is done which is negative except mentioned above/history of present illness.   PAST MEDICAL HISTORY :  Past Medical History:  Diagnosis Date  . DVT (deep venous thrombosis) ([place])   . Hyperlipidemia   . Hypertension   . Lupus   . Pulmonary embolism ([place])   . Renal disorder     PAST SURGICAL HISTORY :   Past Surgical History:  Procedure Laterality Date  . ANKLE SURGERY Right   . CYST EXCISION  92 or 93    Liver cyst removal [facility]    FAMILY HISTORY :  No family history on file.  SOCIAL HISTORY:   Social History  Substance Use Topics  . Smoking status: Never Smoker  . Smokeless tobacco: Never Used  . Alcohol use No    ALLERGIES:  is allergic to vicodin [hydrocodone-acetaminophen]; hydrocodone; and oxycodone.  MEDICATIONS:  Current Outpatient Prescriptions  Medication Sig Dispense Refill  . apixaban (ELIQUIS) 5 MG TABS tablet Take 1 tablet (5 mg total) by mouth 2 (two) times daily. 60 tablet 5  . losartan (COZAAR) 100 MG tablet Take 100 mg by mouth daily.  6  . Multiple Vitamins-Minerals (MULTIVITAMIN WITH MINERALS) tablet Take 1 tablet by mouth daily.    . prednisoLONE 5 MG TABS tablet Take 5 mg by mouth daily.    [name] LYRICA 150 MG capsule Take 150 mg by mouth 2 (two) times daily. Reported on  04/24/2016  5   No current facility-administered medications for this visit.     PHYSICAL EXAMINATION: ECOG PERFORMANCE STATUS: 1 - Symptomatic but completely ambulatory  BP 130/88 (Patient Position: Sitting)   Pulse 66   Temp 97.5 F (36.4 C) (Tympanic)   Resp 20   Ht 6\' 3"  (1.905 m)   Wt 240 lb 3.2 oz (109 kg)   BMI 30.02 kg/m   Filed Weights   10/29/16 1058  Weight: 240 lb 3.2 oz (109 kg)    GENERAL: Well-nourished well-developed; Alert, no distress and comfortable.  Alone.  EYES: no pallor or icterus OROPHARYNX: no thrush or ulceration; good dentition  NECK: supple, no masses felt LYMPH:  no palpable lymphadenopathy in the cervical, axillary or inguinal regions LUNGS: clear to auscultation and  No wheeze or crackles HEART/CVS: regular rate & rhythm and no murmurs; No lower extremity edema ABDOMEN:abdomen soft, non-tender and normal bowel sounds Musculoskeletal:no cyanosis of digits and no clubbing  PSYCH: alert & oriented x 3 with fluent speech NEURO: no focal motor/sensory deficits SKIN: Ecchymosis noted. No active bleeding.  LABORATORY DATA:  I have reviewed the data as listed    Component Value Date/Time   NA 138 10/29/2016 1020   NA 141 04/17/2016 0924   NA 138 04/10/2015 1036   K 3.9 10/29/2016 1020   K 3.5 04/10/2015 1036  CL 105 10/29/2016 1020   CL 108 04/10/2015 1036   CO2 28 10/29/2016 1020   CO2 25 04/10/2015 1036   GLUCOSE 75 10/29/2016 1020   GLUCOSE 96 04/10/2015 1036   BUN 24 (H) 10/29/2016 1020   BUN 23 04/17/2016 0924   BUN 16 04/10/2015 1036   CREATININE 1.21 10/29/2016 1020   CREATININE 1.34 (H) 04/10/2015 1036   CALCIUM 8.7 (L) 10/29/2016 1020   CALCIUM 8.4 (L) 04/10/2015 1036   PROT 7.3 10/29/2016 1020   PROT 6.9 04/17/2016 0924   PROT 7.0 04/10/2015 1036   ALBUMIN 3.9 10/29/2016 1020   ALBUMIN 3.9 04/17/2016 0924   ALBUMIN 3.9 04/10/2015 1036   AST 19 10/29/2016 1020   AST 19 04/10/2015 1036   ALT 20 10/29/2016 1020   ALT 16  (L) 04/10/2015 1036   ALKPHOS 69 10/29/2016 1020   ALKPHOS 71 04/10/2015 1036   BILITOT 0.6 10/29/2016 1020   BILITOT 0.4 04/17/2016 0924   BILITOT 0.7 04/10/2015 1036   GFRNONAA >60 10/29/2016 1020   GFRNONAA >60 04/10/2015 1036   GFRAA >60 10/29/2016 1020   GFRAA >60 04/10/2015 1036    No results found for: SPEP, UPEP  Lab Results  Component Value Date   WBC 3.7 (L) 10/29/2016   NEUTROABS 2.1 10/29/2016   HGB 15.7 10/29/2016   HCT 46.4 10/29/2016   MCV 90.9 10/29/2016   PLT 135 (L) 10/29/2016      Chemistry      Component Value Date/Time   NA 138 10/29/2016 1020   NA 141 04/17/2016 0924   NA 138 04/10/2015 1036   K 3.9 10/29/2016 1020   K 3.5 04/10/2015 1036   CL 105 10/29/2016 1020   CL 108 04/10/2015 1036   CO2 28 10/29/2016 1020   CO2 25 04/10/2015 1036   BUN 24 (H) 10/29/2016 1020   BUN 23 04/17/2016 0924   BUN 16 04/10/2015 1036   CREATININE 1.21 10/29/2016 1020   CREATININE 1.34 (H) 04/10/2015 1036      Component Value Date/Time   CALCIUM 8.7 (L) 10/29/2016 1020   CALCIUM 8.4 (L) 04/10/2015 1036   ALKPHOS 69 10/29/2016 1020   ALKPHOS 71 04/10/2015 1036   AST 19 10/29/2016 1020   AST 19 04/10/2015 1036   ALT 20 10/29/2016 1020   ALT 16 (L) 04/10/2015 1036   BILITOT 0.6 10/29/2016 1020   BILITOT 0.4 04/17/2016 0924   BILITOT 0.7 04/10/2015 1036         ASSESSMENT & PLAN:  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 Tolerating well.   # Mild chronic intermittent thrombocytopenia- ? Sec to immune process- monitor for now.   # History of nephritis/lupus- following up with nephrology.  # follow up in 6 months/labs     Cammie Sickle, MD 10/29/2016 11:50 AM

## 2016-10-30 ENCOUNTER — Inpatient Hospital Stay: Payer: 59 | Admitting: Internal Medicine

## 2016-10-30 ENCOUNTER — Inpatient Hospital Stay: Payer: 59

## 2016-11-18 ENCOUNTER — Encounter (INDEPENDENT_AMBULATORY_CARE_PROVIDER_SITE_OTHER): Payer: Self-pay | Admitting: Vascular Surgery

## 2016-11-18 ENCOUNTER — Ambulatory Visit (INDEPENDENT_AMBULATORY_CARE_PROVIDER_SITE_OTHER): Payer: 59 | Admitting: Vascular Surgery

## 2016-11-18 VITALS — BP 141/90 | HR 66 | Resp 16 | Ht 74.0 in | Wt 237.0 lb

## 2016-11-18 DIAGNOSIS — I87012 Postthrombotic syndrome with ulcer of left lower extremity: Secondary | ICD-10-CM | POA: Diagnosis not present

## 2016-11-18 DIAGNOSIS — I10 Essential (primary) hypertension: Secondary | ICD-10-CM | POA: Diagnosis not present

## 2016-11-18 DIAGNOSIS — E785 Hyperlipidemia, unspecified: Secondary | ICD-10-CM

## 2016-11-18 DIAGNOSIS — L97321 Non-pressure chronic ulcer of left ankle limited to breakdown of skin: Secondary | ICD-10-CM | POA: Diagnosis not present

## 2016-11-18 DIAGNOSIS — Z86711 Personal history of pulmonary embolism: Secondary | ICD-10-CM | POA: Diagnosis not present

## 2016-11-18 DIAGNOSIS — I825Y2 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: Secondary | ICD-10-CM

## 2016-11-18 HISTORY — DX: Postthrombotic syndrome with ulcer of left lower extremity: I87.012

## 2016-11-18 NOTE — Assessment & Plan Note (Signed)
    PLAN/PROCEDURE: A 3 layer wrap was placed on the left lower extremity.  Follow-up in 1 week.

## 2016-11-18 NOTE — Assessment & Plan Note (Addendum)
lipid control important in reducing the progression of atherosclerotic disease.   

## 2016-11-18 NOTE — Progress Notes (Signed)
Patient ID: Jonathon Snow, male   DOB: 1961/04/22, 55 y.o.   MRN: [id number]  Chief Complaint  Patient presents with  . Follow-up    HPI Jonathon Snow is a 55 y.o. male.  I am asked to see the patient by Dr. Ivor Costa for evaluation of a non-healing ulcer over the left ankle.  The patient reports Swelling and pain which started several weeks ago. He has had intermittent swelling since his history of previous DVT and pulmonary embolus several years ago. He wears compression stockings regularly. This area became very tender and red. The redness and the tenderness has improved but not resolved. He has been getting good local wound care. He denies fever or chills or signs of systemic infection. He reports mild swelling in the right leg but no ulceration. He remains on anticoagulation. There is no clear inciting event or trauma that caused the new ulceration.   Past Medical History:  Diagnosis Date  . DVT (deep venous thrombosis) ([place])   . Hyperlipidemia   . Hypertension   . Lupus   . Pulmonary embolism ([place])   . Renal disorder     Past Surgical History:  Procedure Laterality Date  . ANKLE SURGERY Right   . CYST EXCISION  92 or 93    Liver cyst removal [facility]    Family history No bleeding disorders, clotting disorders, or aneurysms  Social History Social History  Substance Use Topics  . Smoking status: Never Smoker  . Smokeless tobacco: Never Used  . Alcohol use No   No IV drug use  Allergies  Allergen Reactions  . Vicodin [Hydrocodone-Acetaminophen] Hives and Other (See Comments)    Rash/hives Other Reaction: severe headaches Rash/hives Other Reaction: severe headaches Rash/hives  . Hydrocodone Rash  . Oxycodone Rash    Takes occasionally.    Current Outpatient Prescriptions  Medication Sig Dispense Refill  . apixaban (ELIQUIS) 5 MG TABS tablet Take 1 tablet (5 mg total) by mouth 2 (two) times daily. 60 tablet 5  . losartan (COZAAR) 100 MG tablet Take 100 mg  by mouth daily.  6  . Multiple Vitamins-Minerals (MULTIVITAMIN WITH MINERALS) tablet Take 1 tablet by mouth daily.    . prednisoLONE 5 MG TABS tablet Take 5 mg by mouth daily.    [name] LYRICA 150 MG capsule Take 150 mg by mouth 2 (two) times daily. Reported on 04/24/2016  5   No current facility-administered medications for this visit.       REVIEW OF SYSTEMS (Negative unless checked)  Constitutional: [] Weight loss  [] Fever  [] Chills Cardiac: [] Chest pain   [] Chest pressure   [] Palpitations   [] Shortness of breath when laying flat   [] Shortness of breath at rest   [] Shortness of breath with exertion. Vascular:  [] Pain in legs with walking   [] Pain in legs at rest   [] Pain in legs when laying flat   [] Claudication   [] Pain in feet when walking  [] Pain in feet at rest  [] Pain in feet when laying flat   [x] History of DVT   [] Phlebitis   [x] Swelling in legs   [] Varicose veins   [x] Non-healing ulcers Pulmonary:   [] Uses home oxygen   [] Productive cough   [] Hemoptysis   [] Wheeze  [x] COPD   [] Asthma Neurologic:  [] Dizziness  [] Blackouts   [] Seizures   [] History of stroke   [] History of TIA  [] Aphasia   [] Temporary blindness   [] Dysphagia   [] Weakness or numbness in arms   [] Weakness  or numbness in legs Musculoskeletal:  [] Arthritis   [] Joint swelling   [] Joint pain   [] Low back pain Hematologic:  [] Easy bruising  [] Easy bleeding   [] Hypercoagulable state   [] Anemic  [] Hepatitis Gastrointestinal:  [] Blood in stool   [] Vomiting blood  [] Gastroesophageal reflux/heartburn   [] Abdominal pain Genitourinary:  [] Chronic kidney disease   [] Difficult urination  [] Frequent urination  [] Burning with urination   [] Hematuria Skin:  [] Rashes   [x] Ulcers   [x] Wounds Psychological:  [] History of anxiety   []  History of major depression.    Physical Exam BP (!) 141/90   Pulse 66   Resp 16   Ht 6' 2"  (1.88 m)   Wt 237 lb (107.5 kg)   BMI 30.43 kg/m  Gen:  WD/WN, NAD Head: South /AT, No temporalis wasting. Prominent  temp pulse not noted. Ear/Nose/Throat: Hearing grossly intact, nares w/o erythema or drainage, oropharynx w/o Erythema/Exudate Eyes: Conjunctiva clear, sclera non-icteric  Neck: trachea midline.  No JVD.  Pulmonary:  Good air movement, no use of accessory muscles Cardiac: RRR, normal S1, S2 Vascular:  Vessel Right Left  Radial Palpable Palpable  Ulnar Palpable Palpable  Brachial Palpable Palpable          Femoral Palpable Palpable  Popliteal Palpable Palpable  PT Palpable 1+ Palpable  DP Palpable Palpable   Gastrointestinal: soft, non-tender/non-distended. No guarding/reflex. No masses, surgical incisions, or scars. Musculoskeletal: M/S 5/5 throughout.  1 cm ulceration about 3-4 cm above the medial left ankle.  No deformity or atrophy. Trace to 1+ right lower extremity edema and 1-2+ left lower extremity edema. Neurologic: Sensation grossly intact in extremities.  Symmetrical.  Speech is fluent. Motor exam as listed above. Psychiatric: Judgment intact, Mood & affect appropriate for pt's clinical situation. Dermatologic: Wound just above the left ankle as described above Lymph : No Cervical, Axillary, or Inguinal lymphadenopathy.   Radiology No results found.  Labs Recent Results (from the past 2160 hour(s))  CBC with Differential     Status: Abnormal   Collection Time: 10/29/16 10:20 AM  Result Value Ref Range   WBC 3.7 (L) 3.8 - 10.6 K/uL   RBC 5.10 4.40 - 5.90 MIL/uL   Hemoglobin 15.7 13.0 - 18.0 g/dL   HCT 46.4 40.0 - 52.0 %   MCV 90.9 80.0 - 100.0 fL   MCH 30.8 26.0 - 34.0 pg   MCHC 33.8 32.0 - 36.0 g/dL   RDW 14.2 11.5 - 14.5 %   Platelets 135 (L) 150 - 440 K/uL   Neutrophils Relative % 55 %   Neutro Abs 2.1 1.4 - 6.5 K/uL   Lymphocytes Relative 25 %   Lymphs Abs 0.9 (L) 1.0 - 3.6 K/uL   Monocytes Relative 14 %   Monocytes Absolute 0.5 0.2 - 1.0 K/uL   Eosinophils Relative 5 %   Eosinophils Absolute 0.2 0 - 0.7 K/uL   Basophils Relative 1 %   Basophils  Absolute 0.0 0 - 0.1 K/uL  Comprehensive metabolic panel     Status: Abnormal   Collection Time: 10/29/16 10:20 AM  Result Value Ref Range   Sodium 138 135 - 145 mmol/L   Potassium 3.9 3.5 - 5.1 mmol/L   Chloride 105 101 - 111 mmol/L   CO2 28 22 - 32 mmol/L   Glucose, Bld 75 65 - 99 mg/dL   BUN 24 (H) 6 - 20 mg/dL   Creatinine, Ser 1.21 0.61 - 1.24 mg/dL   Calcium 8.7 (L) 8.9 - 10.3 mg/dL   Total  Protein 7.3 6.5 - 8.1 g/dL   Albumin 3.9 3.5 - 5.0 g/dL   AST 19 15 - 41 U/L   ALT 20 17 - 63 U/L   Alkaline Phosphatase 69 38 - 126 U/L   Total Bilirubin 0.6 0.3 - 1.2 mg/dL   GFR calc non Af Amer >60 >60 mL/min   GFR calc Af Amer >60 >60 mL/min    Comment: (NOTE) The eGFR has been calculated using the CKD EPI equation. This calculation has not been validated in all clinical situations. eGFR's persistently <60 mL/min signify possible Chronic Kidney Disease.    Anion gap 5 5 - 15    Assessment/Plan:  Hyperlipidemia lipid control important in reducing the progression of atherosclerotic disease.    History of pulmonary embolism Remains on anticoagulation  Hypertension blood pressure control important in reducing the progression of atherosclerotic disease. On appropriate oral medications.   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  Follow-up in 1 week.  Postphlebitic syndrome with ulcer, left ([place]) The patient has venous stasis changes and swelling of the left lower extremity consistent with postphlebitic syndrome. He has been wearing compression stockings. He elevates his legs. He has developed ulceration and we will treat this with an Haematologist weekly for several weeks. I will see him back in several weeks. This is a typical  location of a venous stasis ulcer, and my index of suspicion for arterial disease is small given his good pedal pulse exam and the location of the ulcer. If the wound does not improve rapidly with conservative therapy, this will be assessed further.      Leotis Pain 11/18/2016, 9:54 AM   This note was created with Dragon medical transcription system.  Any errors from dictation are unintentional.

## 2016-11-18 NOTE — Assessment & Plan Note (Signed)
The underlying cause of his postphlebitic syndrome

## 2016-11-18 NOTE — Assessment & Plan Note (Signed)
The patient has venous stasis changes and swelling of the left lower extremity consistent with postphlebitic syndrome. He has been wearing compression stockings. He elevates his legs. He has developed ulceration and we will treat this with an Haematologist weekly for several weeks. I will see him back in several weeks. This is a typical location of a venous stasis ulcer, and my index of suspicion for arterial disease is small given his good pedal pulse exam and the location of the ulcer. If the wound does not improve rapidly with conservative therapy, this will be assessed further.

## 2016-11-18 NOTE — Assessment & Plan Note (Signed)
Remains on anticoagulation 

## 2016-11-18 NOTE — Assessment & Plan Note (Signed)
blood pressure control important in reducing the progression of atherosclerotic disease. On appropriate oral medications.  

## 2016-11-18 NOTE — Patient Instructions (Signed)
Venous Ulcer Introduction A venous ulcer is a shallow sore on your lower leg. It is caused by poor circulation in your veins. Venous ulcer is the most common type of lower leg ulcer. You may have venous ulcers on one leg or on both legs. This condition most often develops around your ankles. This type of ulcer may last for a long time (chronic ulcer) or it may return often (recurrent ulcer). Follow these instructions at home: Wound care  Follow instructions from your doctor about:  How to take care of your wound.  When and how you should change your bandage (dressing).  When you should remove your bandage. If your bandage is dry and gets stuck to your leg when you try to remove it, moisten or wet the bandage with saline solution or water. This helps you to remove it without harming your skin or wound.  Check your wound every day for signs of infection. Have a caregiver do this for you if you are not able to do it yourself. Watch for:  More redness, swelling, or pain.  More fluid or blood.  Pus, warmth, or a bad smell. Medicines  Take over-the-counter and prescription medicines only as told by your doctor.  If you were prescribed an antibiotic medicine, take it or apply it as told by your doctor. Do not stop taking or using the antibiotic even if your condition improves. Activity  Do not stand or sit in one position for a long period of time. Rest with your legs raised during the day. If possible, keep your legs above your heart for 30 minutes, 3-4 times a day, or as told by your doctor.  Do not sit with your legs crossed.  Walk often to increase the blood flow in your legs. Ask your doctor what level of activity is safe for you.  If you are taking a long ride in a car or plane, take a break to walk around at least once every two hours, or as told by your doctor. Ask your doctor if you should take aspirin before long trips. General instructions  Wear elastic stockings, compression  stockings, or support hose as told by your doctor. This is very important.  Raise the foot of your bed as told by your doctor.  Do not smoke.  Keep all follow-up visits as told by your doctor. This is important. Contact a doctor if:  You have a fever.  Your ulcer is getting larger or is not healing.  Your pain gets worse.  You have more redness or swelling around your ulcer.  You have more fluid, blood, or pus coming from your ulcer after it has been cleaned by you or your doctor.  You have warmth or a bad smell coming from your ulcer. This information is not intended to replace advice given to you by your health care provider. Make sure you discuss any questions you have with your health care provider. Document Released: 01/15/2005 Document Revised: 05/15/2016 Document Reviewed: 04/18/2015  2017 Elsevier

## 2016-11-25 ENCOUNTER — Ambulatory Visit (INDEPENDENT_AMBULATORY_CARE_PROVIDER_SITE_OTHER): Payer: 59 | Admitting: Vascular Surgery

## 2016-11-25 ENCOUNTER — Encounter (INDEPENDENT_AMBULATORY_CARE_PROVIDER_SITE_OTHER): Payer: Self-pay | Admitting: Vascular Surgery

## 2016-11-25 VITALS — BP 128/75 | HR 75 | Resp 16 | Ht 75.0 in | Wt 242.0 lb

## 2016-11-25 DIAGNOSIS — M7989 Other specified soft tissue disorders: Secondary | ICD-10-CM

## 2016-11-25 NOTE — Progress Notes (Signed)
History of Present Illness  There is no documented history at this time  Assessments & Plan   There are no diagnoses linked to this encounter.    Additional instructions  Subjective:  Patient presents with venous ulcer of the Left lower extremity.    Procedure:  3 layer unna wrap was placed Left lower extremity.   Plan:   Follow up in one week.  

## 2016-11-26 ENCOUNTER — Ambulatory Visit (INDEPENDENT_AMBULATORY_CARE_PROVIDER_SITE_OTHER): Payer: 59 | Admitting: Family Medicine

## 2016-11-26 ENCOUNTER — Encounter: Payer: Self-pay | Admitting: Family Medicine

## 2016-11-26 ENCOUNTER — Encounter: Payer: Self-pay | Admitting: *Deleted

## 2016-11-26 DIAGNOSIS — Z1211 Encounter for screening for malignant neoplasm of colon: Secondary | ICD-10-CM | POA: Insufficient documentation

## 2016-11-26 DIAGNOSIS — Z0000 Encounter for general adult medical examination without abnormal findings: Secondary | ICD-10-CM | POA: Diagnosis not present

## 2016-11-26 LAB — TSH: TSH: 1.17 mIU/L (ref 0.40–4.50)

## 2016-11-26 LAB — COMPLETE METABOLIC PANEL WITH GFR
ALBUMIN: 3.8 g/dL (ref 3.6–5.1)
ALK PHOS: 72 U/L (ref 40–115)
ALT: 19 U/L (ref 9–46)
AST: 16 U/L (ref 10–35)
BILIRUBIN TOTAL: 0.7 mg/dL (ref 0.2–1.2)
BUN: 23 mg/dL (ref 7–25)
CO2: 24 mmol/L (ref 20–31)
Calcium: 8.9 mg/dL (ref 8.6–10.3)
Chloride: 105 mmol/L (ref 98–110)
Creat: 1.25 mg/dL (ref 0.70–1.33)
GFR, EST AFRICAN AMERICAN: 75 mL/min (ref >=60)
GFR, EST NON AFRICAN AMERICAN: 65 mL/min (ref >=60)
Glucose, Bld: 87 mg/dL (ref 65–99)
Potassium: 4.1 mmol/L (ref 3.5–5.3)
Sodium: 139 mmol/L (ref 135–146)
TOTAL PROTEIN: 6.9 g/dL (ref 6.1–8.1)

## 2016-11-26 LAB — CBC WITH DIFFERENTIAL/PLATELET
BASOS ABS: 0 {cells}/uL (ref 0–200)
Basophils Relative: 0 %
EOS ABS: 200 {cells}/uL (ref 15–500)
Eosinophils Relative: 5 %
HCT: 45.1 % (ref 38.5–50.0)
Hemoglobin: 15.1 g/dL (ref 13.2–17.1)
Lymphocytes Relative: 24 %
Lymphs Abs: 960 cells/uL (ref 850–3900)
MCH: 30.4 pg (ref 27.0–33.0)
MCHC: 33.5 g/dL (ref 32.0–36.0)
MCV: 90.7 fL (ref 80.0–100.0)
MONOS PCT: 12 %
MPV: 10.7 fL (ref 7.5–12.5)
Monocytes Absolute: 480 cells/uL (ref 200–950)
NEUTROS ABS: 2360 {cells}/uL (ref 1500–7800)
NEUTROS PCT: 59 %
PLATELETS: 160 10*3/uL (ref 140–400)
RBC: 4.97 MIL/uL (ref 4.20–5.80)
RDW: 14.4 % (ref 11.0–15.0)
WBC: 4 10*3/uL (ref 3.8–10.8)

## 2016-11-26 LAB — LIPID PANEL
Cholesterol: 192 mg/dL (ref <200)
HDL: 50 mg/dL (ref >40)
LDL Cholesterol: 126 mg/dL — ABNORMAL HIGH (ref <100)
TRIGLYCERIDES: 78 mg/dL (ref <150)
Total CHOL/HDL Ratio: 3.8 Ratio (ref <5.0)
VLDL: 16 mg/dL (ref <30)

## 2016-11-26 LAB — PSA: PSA: 0.6 ng/mL (ref <=4.0)

## 2016-11-26 NOTE — Progress Notes (Signed)
Name: Jonathon Snow   MRN: [id number]    DOB: September 11, 1961   Date:11/26/2016       Progress Note  Subjective  Chief Complaint  Chief Complaint  Patient presents with  . Annual Exam    HPI  Pt. Presents for annual physical exam. Last colonoscopy was in 2009, does not remember when he was asked to obtain a repeat colonoscopy.   Past Medical History:  Diagnosis Date  . DVT (deep venous thrombosis) ([place])   . Hyperlipidemia   . Hypertension   . Lupus   . Pulmonary embolism ([place])   . Renal disorder     Past Surgical History:  Procedure Laterality Date  . ANKLE SURGERY Right   . CYST EXCISION  92 or 93    Liver cyst removal [facility]    No family history on file.  Social History   Social History  . Marital status: Divorced    Spouse name: N/A  . Number of children: N/A  . Years of education: N/A   Occupational History  . Not on file.   Social History Main Topics  . Smoking status: Never Smoker  . Smokeless tobacco: Never Used  . Alcohol use No  . Drug use: Unknown  . Sexual activity: Not on file   Other Topics Concern  . Not on file   Social History Narrative  . No narrative on file     Current Outpatient Prescriptions:  .  apixaban (ELIQUIS) 5 MG TABS tablet, Take 1 tablet (5 mg total) by mouth 2 (two) times daily., Disp: 60 tablet, Rfl: 5 .  losartan (COZAAR) 100 MG tablet, Take 100 mg by mouth daily., Disp: , Rfl: 6 .  Multiple Vitamins-Minerals (MULTIVITAMIN WITH MINERALS) tablet, Take 1 tablet by mouth daily., Disp: , Rfl:  .  prednisoLONE 5 MG TABS tablet, Take 5 mg by mouth daily., Disp: , Rfl:   Allergies  Allergen Reactions  . Vicodin [Hydrocodone-Acetaminophen] Hives and Other (See Comments)    Rash/hives Other Reaction: severe headaches Rash/hives Other Reaction: severe headaches Rash/hives  . Hydrocodone Rash  . Oxycodone Rash    Takes occasionally.     Review of Systems  Constitutional: Positive for malaise/fatigue. Negative for chills  and fever.  HENT: Negative for congestion and sore throat.   Eyes: Positive for blurred vision (sometimes has blurred vision.). Negative for double vision.  Respiratory: Positive for shortness of breath. Negative for cough and sputum production.   Cardiovascular: Positive for leg swelling (in left leg, seeing Vascular surgery). Negative for chest pain.  Gastrointestinal: Negative for abdominal pain and blood in stool.  Genitourinary: Negative for hematuria.  Musculoskeletal: Positive for neck pain. Negative for back pain and myalgias.  Neurological: Positive for headaches (intermittent mild headaches). Negative for dizziness.  Psychiatric/Behavioral: Negative for depression. The patient is not nervous/anxious and does not have insomnia.     Objective  Vitals:   11/26/16 0755  BP: 138/86  Pulse: 79  Resp: 18  Temp: 98 F (36.7 C)  TempSrc: Oral  SpO2: 94%  Weight: 240 lb (108.9 kg)  Height: 6\' 2"  (1.88 m)    Physical Exam  Constitutional: He is oriented to person, place, and time and well-developed, well-nourished, and in no distress.  HENT:  Head: Normocephalic and atraumatic.  Mouth/Throat: No posterior oropharyngeal erythema.  Eyes: Pupils are equal, round, and reactive to light.  Cardiovascular: Normal rate, regular rhythm and normal heart sounds.   No murmur heard. Pulmonary/Chest: Effort normal and breath  sounds normal. He has no wheezes.  Abdominal: Soft. Bowel sounds are normal. There is no tenderness.  Genitourinary: Rectum normal and prostate normal.  Musculoskeletal:       Right ankle: He exhibits swelling.  Left leg wrapped in ace bandage for compression.  Neurological: He is alert and oriented to person, place, and time.  Psychiatric: Mood, memory, affect and judgment normal.  Nursing note and vitals reviewed.    Assessment & Plan  1. Annual physical exam  - CBC with Differential - COMPLETE METABOLIC PANEL WITH GFR - Lipid Profile - TSH - Vitamin D  (25 hydroxy) - PSA  2. Encounter for screening colonoscopy  - Ambulatory referral to Gastroenterology   Abagail Limb [name] [facility] 11/26/2016 7:59 AM

## 2016-11-27 LAB — VITAMIN D 25 HYDROXY (VIT D DEFICIENCY, FRACTURES): Vit D, 25-Hydroxy: 29 ng/mL — ABNORMAL LOW (ref 30–100)

## 2016-12-02 ENCOUNTER — Encounter (INDEPENDENT_AMBULATORY_CARE_PROVIDER_SITE_OTHER): Payer: Self-pay | Admitting: Vascular Surgery

## 2016-12-02 ENCOUNTER — Ambulatory Visit (INDEPENDENT_AMBULATORY_CARE_PROVIDER_SITE_OTHER): Payer: 59 | Admitting: Vascular Surgery

## 2016-12-02 DIAGNOSIS — I87012 Postthrombotic syndrome with ulcer of left lower extremity: Secondary | ICD-10-CM

## 2016-12-02 NOTE — Progress Notes (Deleted)
History of Present Illness  There is no documented history at this time  Assessments & Plan   There are no diagnoses linked to this encounter.    Additional instructions  Subjective:  Patient presents with venous ulcer of the Left lower extremity.    Procedure:  3 layer unna wrap was placed Left lower extremity.   Plan:   Follow up in one week.  

## 2016-12-08 NOTE — Progress Notes (Signed)
Leg swelling better. Will come out of [employer] today

## 2016-12-09 ENCOUNTER — Ambulatory Visit (INDEPENDENT_AMBULATORY_CARE_PROVIDER_SITE_OTHER): Payer: 59 | Admitting: Vascular Surgery

## 2016-12-23 ENCOUNTER — Ambulatory Visit: Payer: 59 | Admitting: General Surgery

## 2016-12-24 ENCOUNTER — Encounter: Payer: Self-pay | Admitting: Family Medicine

## 2016-12-24 ENCOUNTER — Ambulatory Visit (INDEPENDENT_AMBULATORY_CARE_PROVIDER_SITE_OTHER): Payer: 59 | Admitting: Family Medicine

## 2016-12-24 VITALS — BP 136/70 | HR 76 | Temp 98.1°F | Resp 16 | Ht 74.0 in | Wt 243.6 lb

## 2016-12-24 DIAGNOSIS — E785 Hyperlipidemia, unspecified: Secondary | ICD-10-CM

## 2016-12-24 MED ORDER — ATORVASTATIN CALCIUM 10 MG PO TABS: 10.0000 mg | ORAL_TABLET | Freq: Every day | ORAL | 0 refills | Status: DC

## 2016-12-24 NOTE — Progress Notes (Signed)
Name: Jonathon Snow   MRN: [id number]    DOB: Oct 08, 1961   Date:12/24/2016       Progress Note  Subjective  Chief Complaint  Chief Complaint  Patient presents with  . Follow-up    cholesterol    Hyperlipidemia  This is a chronic problem. The problem is uncontrolled. Recent lipid tests were reviewed and are high. He is currently on no antihyperlipidemic treatment. Risk factors for coronary artery disease include dyslipidemia and male sex.     Past Medical History:  Diagnosis Date  . DVT (deep venous thrombosis) ([place])   . Hyperlipidemia   . Hypertension   . Lupus   . Pulmonary embolism ([place])   . Renal disorder     Past Surgical History:  Procedure Laterality Date  . ANKLE SURGERY Right   . CYST EXCISION  92 or 93    Liver cyst removal [facility]    History reviewed. No pertinent family history.  Social History   Social History  . Marital status: Divorced    Spouse name: N/A  . Number of children: N/A  . Years of education: N/A   Occupational History  . Not on file.   Social History Main Topics  . Smoking status: Never Smoker  . Smokeless tobacco: Never Used  . Alcohol use No  . Drug use: Unknown  . Sexual activity: Not on file   Other Topics Concern  . Not on file   Social History Narrative  . No narrative on file     Current Outpatient Prescriptions:  .  apixaban (ELIQUIS) 5 MG TABS tablet, Take 1 tablet (5 mg total) by mouth 2 (two) times daily., Disp: 60 tablet, Rfl: 5 .  losartan (COZAAR) 100 MG tablet, Take 100 mg by mouth daily., Disp: , Rfl: 6 .  Multiple Vitamins-Minerals (MULTIVITAMIN WITH MINERALS) tablet, Take 1 tablet by mouth daily., Disp: , Rfl:  .  prednisoLONE 5 MG TABS tablet, Take 5 mg by mouth daily., Disp: , Rfl:   Allergies  Allergen Reactions  . Vicodin [Hydrocodone-Acetaminophen] Hives and Other (See Comments)    Rash/hives Other Reaction: severe headaches Rash/hives Other Reaction: severe headaches Rash/hives  . Hydrocodone  Rash  . Oxycodone Rash    Takes occasionally.     ROS  Please see history of present illness for complete discussion of ROS  Objective  Vitals:   12/24/16 1029  BP: 136/70  Pulse: 76  Resp: 16  Temp: 98.1 F (36.7 C)  TempSrc: Oral  SpO2: 96%  Weight: 243 lb 9.6 oz (110.5 kg)  Height: 6\' 2"  (1.88 m)    Physical Exam  Constitutional: He is oriented to person, place, and time and well-developed, well-nourished, and in no distress.  HENT:  Head: Normocephalic and atraumatic.  Cardiovascular: Normal rate, regular rhythm, S1 normal, S2 normal and normal heart sounds.   No murmur heard. Pulmonary/Chest: Effort normal and breath sounds normal. He has no wheezes. He has no rhonchi.  Neurological: He is alert and oriented to person, place, and time.  Psychiatric: Mood, memory, affect and judgment normal.  Nursing note and vitals reviewed.       Assessment & Plan  1. Hyperlipidemia, unspecified hyperlipidemia type Started on atorvastatin 10 mg at bedtime, recheck in 3-4 months - atorvastatin (LIPITOR) 10 MG tablet; Take 1 tablet (10 mg total) by mouth daily.  Dispense: 90 tablet; Refill: 0   Zhania Shaheen [name] 12/24/2016 10:48 AM

## 2016-12-30 ENCOUNTER — Other Ambulatory Visit: Payer: Self-pay | Admitting: Internal Medicine

## 2016-12-31 DIAGNOSIS — L718 Other rosacea: Secondary | ICD-10-CM | POA: Diagnosis not present

## 2016-12-31 DIAGNOSIS — I8312 Varicose 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: Secondary | ICD-10-CM | POA: Diagnosis not present

## 2017-01-06 ENCOUNTER — Encounter: Payer: Self-pay | Admitting: General Surgery

## 2017-01-06 ENCOUNTER — Ambulatory Visit (INDEPENDENT_AMBULATORY_CARE_PROVIDER_SITE_OTHER): Payer: 59 | Admitting: General Surgery

## 2017-01-06 ENCOUNTER — Telehealth: Payer: Self-pay | Admitting: General Surgery

## 2017-01-06 VITALS — BP 130/78 | HR 74 | Resp 14 | Ht 74.0 in | Wt 245.0 lb

## 2017-01-06 DIAGNOSIS — Z1211 Encounter for screening for malignant neoplasm of colon: Secondary | ICD-10-CM

## 2017-01-06 NOTE — Patient Instructions (Addendum)
Patient to have a color guard test mail to him.

## 2017-01-06 NOTE — Progress Notes (Signed)
Patient ID: Jonathon Snow, male   DOB: July 11, 1961, 56 y.o.   MRN: [id number]  Chief Complaint  Patient presents with  . Colonoscopy    HPI Jonathon Snow is a 56 y.o. male Here today for a evaluation of a screening colonoscopy. He states he had a colonoscopy 10 years ago. No GI problems at this time.   The patient reports he developed of blood clots after injuries to the lower extremity in 2007 and then again in 2014. He is presently on lifelong anticoagulation. HPI  Past Medical History:  Diagnosis Date  . DVT (deep venous thrombosis) ([place])   . Hyperlipidemia   . Hypertension   . Lupus   . Pulmonary embolism ([place])   . Renal disorder     Past Surgical History:  Procedure Laterality Date  . ANKLE SURGERY Right   . CYST EXCISION  92 or 93    Liver cyst removal [facility]     History reviewed. No pertinent family history.  Social History Social History  Substance Use Topics  . Smoking status: Never Smoker  . Smokeless tobacco: Never Used  . Alcohol use No    Allergies  Allergen Reactions  . Vicodin [Hydrocodone-Acetaminophen] Hives and Other (See Comments)    Rash/hives Other Reaction: severe headaches Rash/hives Other Reaction: severe headaches Rash/hives  . Hydrocodone Rash  . Oxycodone Rash    Takes occasionally.    Current Outpatient Prescriptions  Medication Sig Dispense Refill  . atorvastatin (LIPITOR) 10 MG tablet Take 1 tablet (10 mg total) by mouth daily. 90 tablet 0  . ELIQUIS 5 MG TABS tablet TAKE 1 TABLET BY MOUTH 2 TIMES DAILY 60 tablet 5  . losartan (COZAAR) 100 MG tablet Take 100 mg by mouth daily.  6  . Multiple Vitamins-Minerals (MULTIVITAMIN WITH MINERALS) tablet Take 1 tablet by mouth daily.    . prednisoLONE 5 MG TABS tablet Take 5 mg by mouth daily.     No current facility-administered medications for this visit.     Review of Systems Review of Systems  Constitutional: Negative.   Respiratory: Negative.   Cardiovascular: Negative.    Gastrointestinal: Negative.     Blood pressure 130/78, pulse 74, resp. rate 14, height 6\' 2"  (1.88 m), weight 245 lb (111.1 kg).  Physical Exam Physical Exam  Constitutional: He is oriented to person, place, and time. He appears well-developed and well-nourished.  Eyes: Conjunctivae are normal. No scleral icterus.  Neck: Neck supple.  Cardiovascular: Normal rate, regular rhythm and normal heart sounds.   Pulmonary/Chest: Effort normal and breath sounds normal.  Lymphadenopathy:    He has no cervical adenopathy.  Neurological: He is alert and oriented to person, place, and time.  Skin: Skin is warm and dry.    Data Reviewed Comprehensive metabolic panel dated [id number] showed a BUN of 23, creatinine 1.25 with an estimated GFR of 65. EDC of December 2017 showed a hemoglobin of 15.1 with an MCV of 91, white blood cell count 4, 000. Platelet count of 160,000.   Vascular surgery notes of December 2017 reviewed, left lower extremity postphlebitic skin changes. Assessment    Candidate for screening for colonic malignancy.  Patient on lifelong anticoagulation due to DVT.    Plan    The patient has been placed on lifelong anticoagulation. He has no clear past history of colonic polyps and no family history of colon cancer or polyps. The potential for Cologuard testing to minimize his risk or confirm that the risks associated with  anticoagulation cessation for an endoscopic procedure was worthwhile was reviewed.  It is Cologuard test is negative would repeat in 5 years. If Cologuard is positive the risk of associated with Coagulation cessation will be worthwhile.    Patient to have a color guard test mail to him. This information has been scribed by Gaspar Cola CMA.   Jonathon Snow 01/07/2017, 2:24 PM

## 2017-01-06 NOTE — Telephone Encounter (Signed)
L/M FOR PT TO CALL BACK.I NEED HIM TO CLARIFY IF HE WANTED [place] TO BE ABLE TO CONTACT HIM WITH A DETAIL MESSAGE? HE PUT HIS DAUGHTER'S # IN THE BOX.        WHILE TYPING THIS THE PATIENT CALLED BACK & WANTED TO CHANGE DAUGHTER'S # TO HIS #/MTH

## 2017-02-03 DIAGNOSIS — Z1212 Encounter for screening for malignant neoplasm of rectum: Secondary | ICD-10-CM | POA: Diagnosis not present

## 2017-02-03 DIAGNOSIS — J449 Chronic obstructive pulmonary disease, unspecified: Secondary | ICD-10-CM | POA: Diagnosis not present

## 2017-02-03 DIAGNOSIS — Z86711 Personal history of pulmonary embolism: Secondary | ICD-10-CM | POA: Diagnosis not present

## 2017-02-03 DIAGNOSIS — Z1211 Encounter for screening for malignant neoplasm of colon: Secondary | ICD-10-CM | POA: Diagnosis not present

## 2017-02-03 DIAGNOSIS — R0602 Shortness of breath: Secondary | ICD-10-CM | POA: Diagnosis not present

## 2017-02-11 LAB — COLOGUARD

## 2017-02-17 ENCOUNTER — Telehealth: Payer: Self-pay | Admitting: *Deleted

## 2017-02-17 NOTE — Telephone Encounter (Signed)
Pt is aware of negative results. When will he need follow up testing? Pt states we can send him a MyChart message with this information. Thanks

## 2017-02-17 NOTE — Telephone Encounter (Signed)
Patient called wanting to know the results of his cologuard, he [facility] weeks [facility]. Im not sure how long this would take to get the results back and I did not see anything in the system.

## 2017-02-18 DIAGNOSIS — M3214 Glomerular disease in systemic lupus erythematosus: Secondary | ICD-10-CM | POA: Diagnosis not present

## 2017-02-18 DIAGNOSIS — R0602 Shortness of breath: Secondary | ICD-10-CM | POA: Diagnosis not present

## 2017-02-18 DIAGNOSIS — R809 Proteinuria, unspecified: Secondary | ICD-10-CM | POA: Diagnosis not present

## 2017-02-18 DIAGNOSIS — I10 Essential (primary) hypertension: Secondary | ICD-10-CM | POA: Diagnosis not present

## 2017-02-18 DIAGNOSIS — Z86711 Personal history of pulmonary embolism: Secondary | ICD-10-CM | POA: Diagnosis not present

## 2017-02-20 ENCOUNTER — Encounter: Payer: Self-pay | Admitting: General Surgery

## 2017-02-24 ENCOUNTER — Encounter: Payer: Self-pay | Admitting: General Surgery

## 2017-03-06 DIAGNOSIS — R809 Proteinuria, unspecified: Secondary | ICD-10-CM | POA: Diagnosis not present

## 2017-03-06 DIAGNOSIS — I10 Essential (primary) hypertension: Secondary | ICD-10-CM | POA: Diagnosis not present

## 2017-03-06 DIAGNOSIS — M3214 Glomerular disease in systemic lupus erythematosus: Secondary | ICD-10-CM | POA: Diagnosis not present

## 2017-03-11 DIAGNOSIS — I10 Essential (primary) hypertension: Secondary | ICD-10-CM | POA: Diagnosis not present

## 2017-03-11 DIAGNOSIS — R809 Proteinuria, unspecified: Secondary | ICD-10-CM | POA: Diagnosis not present

## 2017-03-11 DIAGNOSIS — M3214 Glomerular disease in systemic lupus erythematosus: Secondary | ICD-10-CM | POA: Diagnosis not present

## 2017-03-11 DIAGNOSIS — N183 Chronic kidney disease, stage 3 (moderate): Secondary | ICD-10-CM | POA: Diagnosis not present

## 2017-03-11 NOTE — Telephone Encounter (Signed)
Notified patient as instructed, patient pleased. Discussed follow-up appointments, patient agrees  

## 2017-03-11 NOTE — Telephone Encounter (Signed)
-----   Message from Robert Bellow, MD sent at 03/11/2017 10:08 AM EDT ----- Patient should repeat Cologuard every three years with his PCP.

## 2017-03-18 DIAGNOSIS — J029 Acute pharyngitis, unspecified: Secondary | ICD-10-CM | POA: Diagnosis not present

## 2017-03-18 DIAGNOSIS — J069 Acute upper respiratory infection, unspecified: Secondary | ICD-10-CM | POA: Diagnosis not present

## 2017-03-25 ENCOUNTER — Ambulatory Visit (INDEPENDENT_AMBULATORY_CARE_PROVIDER_SITE_OTHER): Payer: 59 | Admitting: Family Medicine

## 2017-03-25 ENCOUNTER — Encounter: Payer: Self-pay | Admitting: Family Medicine

## 2017-03-25 VITALS — BP 140/80 | HR 81 | Temp 97.7°F | Resp 18 | Ht 74.0 in | Wt 245.8 lb

## 2017-03-25 DIAGNOSIS — M25541 Pain in joints of right hand: Secondary | ICD-10-CM

## 2017-03-25 DIAGNOSIS — I10 Essential (primary) hypertension: Secondary | ICD-10-CM | POA: Diagnosis not present

## 2017-03-25 DIAGNOSIS — E7800 Pure hypercholesterolemia, unspecified: Secondary | ICD-10-CM | POA: Diagnosis not present

## 2017-03-25 LAB — COMPLETE METABOLIC PANEL WITH GFR
ALT: 14 U/L (ref 9–46)
AST: 10 U/L (ref 10–35)
Albumin: 3 g/dL — ABNORMAL LOW (ref 3.6–5.1)
Alkaline Phosphatase: 65 U/L (ref 40–115)
BILIRUBIN TOTAL: 0.5 mg/dL (ref 0.2–1.2)
BUN: 49 mg/dL — AB (ref 7–25)
CALCIUM: 7.9 mg/dL — AB (ref 8.6–10.3)
CO2: 26 mmol/L (ref 20–31)
CREATININE: 2.29 mg/dL — AB (ref 0.70–1.33)
Chloride: 106 mmol/L (ref 98–110)
GFR, EST AFRICAN AMERICAN: 36 mL/min — AB (ref >=60)
GFR, Est Non African American: 31 mL/min — ABNORMAL LOW (ref >=60)
Glucose, Bld: 86 mg/dL (ref 65–99)
Potassium: 4.2 mmol/L (ref 3.5–5.3)
Sodium: 142 mmol/L (ref 135–146)
TOTAL PROTEIN: 5.7 g/dL — AB (ref 6.1–8.1)

## 2017-03-25 LAB — LIPID PANEL
CHOLESTEROL: 245 mg/dL — AB (ref <200)
HDL: 53 mg/dL (ref >40)
LDL Cholesterol: 169 mg/dL — ABNORMAL HIGH (ref <100)
TRIGLYCERIDES: 117 mg/dL (ref <150)
Total CHOL/HDL Ratio: 4.6 Ratio (ref <5.0)
VLDL: 23 mg/dL (ref <30)

## 2017-03-25 MED ORDER — ATORVASTATIN CALCIUM 10 MG PO TABS: 10.0000 mg | ORAL_TABLET | Freq: Every day | ORAL | 0 refills | Status: DC

## 2017-03-25 NOTE — Progress Notes (Signed)
Name: Jonathon Snow   MRN: [id number]    DOB: Sep 18, 1961   Date:03/25/2017       Progress Note  Subjective  Chief Complaint  Chief Complaint  Patient presents with  . Hypertension  . Hyperlipidemia    Hyperlipidemia  This is a chronic problem. The problem is uncontrolled. Recent lipid tests were reviewed and are high. Associated symptoms include leg pain (concerned about weakness in legs.) and myalgias. Pertinent negatives include no chest pain or shortness of breath. Current antihyperlipidemic treatment includes statins. Risk factors for coronary artery disease include dyslipidemia and male sex.  Hypertension  This is a chronic problem. The problem is unchanged. The problem is controlled. Pertinent negatives include no blurred vision, chest pain, headaches, palpitations or shortness of breath. Past treatments include angiotensin blockers. There is no history of kidney disease (has lupus affecting the kidneys), CAD/MI or CVA.     Past Medical History:  Diagnosis Date  . DVT (deep venous thrombosis) ([place])   . Hyperlipidemia   . Hypertension   . Lupus   . Pulmonary embolism ([place])   . Renal disorder     Past Surgical History:  Procedure Laterality Date  . ANKLE SURGERY Right   . CYST EXCISION  92 or 93    Liver cyst removal [facility]    No family history on file.  Social History   Social History  . Marital status: Divorced    Spouse name: N/A  . Number of children: N/A  . Years of education: N/A   Occupational History  . Not on file.   Social History Main Topics  . Smoking status: Never Smoker  . Smokeless tobacco: Never Used  . Alcohol use No  . Drug use: Unknown  . Sexual activity: Not on file   Other Topics Concern  . Not on file   Social History Narrative  . No narrative on file     Current Outpatient Prescriptions:  .  atorvastatin (LIPITOR) 10 MG tablet, Take 1 tablet (10 mg total) by mouth daily., Disp: 90 tablet, Rfl: 0 .  ELIQUIS 5 MG TABS tablet,  TAKE 1 TABLET BY MOUTH 2 TIMES DAILY, Disp: 60 tablet, Rfl: 5 .  losartan (COZAAR) 100 MG tablet, Take 100 mg by mouth daily., Disp: , Rfl: 6 .  Multiple Vitamins-Minerals (MULTIVITAMIN WITH MINERALS) tablet, Take 1 tablet by mouth daily., Disp: , Rfl:  .  mycophenolate (CELLCEPT) 500 MG tablet, Take 1,000 mg by mouth 2 (two) times daily., Disp: , Rfl:  .  PREDNISONE PO, Take 60 mg by mouth once., Disp: , Rfl:  .  prednisoLONE 5 MG TABS tablet, Take 5 mg by mouth daily., Disp: , Rfl:   Allergies  Allergen Reactions  . Vicodin [Hydrocodone-Acetaminophen] Hives and Other (See Comments)    Rash/hives Other Reaction: severe headaches Rash/hives Other Reaction: severe headaches Rash/hives  . Hydrocodone Rash  . Oxycodone Rash    Takes occasionally.     Review of Systems  Eyes: Negative for blurred vision.  Respiratory: Negative for shortness of breath.   Cardiovascular: Negative for chest pain and palpitations.  Musculoskeletal: Positive for myalgias.  Neurological: Negative for headaches.    Objective  Vitals:   03/25/17 0829  BP: 140/80  Pulse: 81  Resp: 18  Temp: 97.7 F (36.5 C)  TempSrc: Oral  SpO2: 97%  Weight: 245 lb 12.8 oz (111.5 kg)  Height: 6\' 2"  (1.88 m)    Physical Exam  Constitutional: He is oriented to person,  place, and time and well-developed, well-nourished, and in no distress.  HENT:  Head: Normocephalic and atraumatic.  Cardiovascular: Normal rate, regular rhythm, S1 normal, S2 normal and normal heart sounds.   No murmur heard. Pulmonary/Chest: Effort normal and breath sounds normal. He has no wheezes. He has no rhonchi.  Neurological: He is alert and oriented to person, place, and time.  Psychiatric: Mood, memory, affect and judgment normal.  Nursing note and vitals reviewed.      Recent Results (from the past 2160 hour(s))  Cologuard     Status: None   Collection Time: 02/03/17 12:00 AM  Result Value Ref Range   Cologuard        Assessment & Plan  1. Essential hypertension BP stable and controlled on present hypertensive therapy  2. Pure hypercholesterolemia Advised to temporarily discontinue atorvastatin for 1-2 weeks to find out if the muscle and joint pain is a result of statin side effect, obtain FLP today, adjust as necessary. - COMPLETE METABOLIC PANEL WITH GFR - Lipid panel - atorvastatin (LIPITOR) 10 MG tablet; Take 1 tablet (10 mg total) by mouth daily.  Dispense: 90 tablet; Refill: 0  3. Arthralgia of right hand As above, reassess in 1-2 weeks    Syed [name] 03/25/2017 8:42 AM

## 2017-03-27 ENCOUNTER — Telehealth: Payer: Self-pay

## 2017-03-27 MED ORDER — ROSUVASTATIN CALCIUM 10 MG PO TABS: 10.0000 mg | ORAL_TABLET | Freq: Every day | ORAL | 0 refills | Status: DC

## 2017-03-27 NOTE — Telephone Encounter (Signed)
Patient has been notified of lab results and a new prescription for rosuvastatin 10 mg daily at bedtime has been sent to [facility] [address] per Dr. Manuella Ghazi, patient has been notified

## 2017-03-31 DIAGNOSIS — I10 Essential (primary) hypertension: Secondary | ICD-10-CM | POA: Diagnosis not present

## 2017-03-31 DIAGNOSIS — M3214 Glomerular disease in systemic lupus erythematosus: Secondary | ICD-10-CM | POA: Diagnosis not present

## 2017-03-31 DIAGNOSIS — N183 Chronic kidney disease, stage 3 (moderate): Secondary | ICD-10-CM | POA: Diagnosis not present

## 2017-04-22 ENCOUNTER — Other Ambulatory Visit: Payer: Self-pay | Admitting: Specialist

## 2017-04-22 DIAGNOSIS — R0602 Shortness of breath: Secondary | ICD-10-CM

## 2017-04-27 ENCOUNTER — Emergency Department: Payer: 59

## 2017-04-27 ENCOUNTER — Encounter: Payer: Self-pay | Admitting: Emergency Medicine

## 2017-04-27 ENCOUNTER — Inpatient Hospital Stay
Admission: EM | Admit: 2017-04-27 | Discharge: 2017-05-03 | DRG: 683 | Disposition: A | Discharge status: Home Health | Payer: 59 | Attending: Internal Medicine | Admitting: Internal Medicine

## 2017-04-27 ENCOUNTER — Ambulatory Visit: Payer: 59

## 2017-04-27 DIAGNOSIS — I2510 Atherosclerotic heart disease of native coronary artery without angina pectoris: Secondary | ICD-10-CM | POA: Diagnosis present

## 2017-04-27 DIAGNOSIS — L930 Discoid lupus erythematosus: Secondary | ICD-10-CM | POA: Diagnosis not present

## 2017-04-27 DIAGNOSIS — R531 Weakness: Secondary | ICD-10-CM | POA: Diagnosis not present

## 2017-04-27 DIAGNOSIS — M3214 Glomerular disease in systemic lupus erythematosus: Secondary | ICD-10-CM

## 2017-04-27 DIAGNOSIS — Z8249 Family history of ischemic heart disease and other diseases of the circulatory system: Secondary | ICD-10-CM | POA: Diagnosis not present

## 2017-04-27 DIAGNOSIS — E785 Hyperlipidemia, unspecified: Secondary | ICD-10-CM | POA: Diagnosis present

## 2017-04-27 DIAGNOSIS — N183 Chronic kidney disease, stage 3 (moderate): Secondary | ICD-10-CM | POA: Diagnosis present

## 2017-04-27 DIAGNOSIS — I10 Essential (primary) hypertension: Secondary | ICD-10-CM | POA: Diagnosis not present

## 2017-04-27 DIAGNOSIS — L03111 Cellulitis of right axilla: Secondary | ICD-10-CM | POA: Diagnosis present

## 2017-04-27 DIAGNOSIS — Z86711 Personal history of pulmonary embolism: Secondary | ICD-10-CM

## 2017-04-27 DIAGNOSIS — I129 Hypertensive chronic kidney disease with stage 1 through stage 4 chronic kidney disease, or unspecified chronic kidney disease: Secondary | ICD-10-CM | POA: Diagnosis present

## 2017-04-27 DIAGNOSIS — R319 Hematuria, unspecified: Secondary | ICD-10-CM

## 2017-04-27 DIAGNOSIS — K5900 Constipation, unspecified: Secondary | ICD-10-CM | POA: Diagnosis present

## 2017-04-27 DIAGNOSIS — B9562 Methicillin resistant Staphylococcus aureus infection as the cause of diseases classified elsewhere: Secondary | ICD-10-CM | POA: Diagnosis present

## 2017-04-27 DIAGNOSIS — R7989 Other specified abnormal findings of blood chemistry: Secondary | ICD-10-CM | POA: Diagnosis not present

## 2017-04-27 DIAGNOSIS — L02411 Cutaneous abscess of right axilla: Secondary | ICD-10-CM | POA: Diagnosis not present

## 2017-04-27 DIAGNOSIS — J449 Chronic obstructive pulmonary disease, unspecified: Secondary | ICD-10-CM | POA: Diagnosis present

## 2017-04-27 DIAGNOSIS — Z86718 Personal history of other venous thrombosis and embolism: Secondary | ICD-10-CM | POA: Diagnosis not present

## 2017-04-27 DIAGNOSIS — N492 Inflammatory disorders of scrotum: Secondary | ICD-10-CM | POA: Diagnosis not present

## 2017-04-27 DIAGNOSIS — N179 Acute kidney failure, unspecified: Principal | ICD-10-CM | POA: Diagnosis present

## 2017-04-27 DIAGNOSIS — R809 Proteinuria, unspecified: Secondary | ICD-10-CM | POA: Diagnosis present

## 2017-04-27 DIAGNOSIS — N1832 Chronic kidney disease, stage 3b: Secondary | ICD-10-CM

## 2017-04-27 DIAGNOSIS — N189 Chronic kidney disease, unspecified: Secondary | ICD-10-CM

## 2017-04-27 DIAGNOSIS — Z7901 Long term (current) use of anticoagulants: Secondary | ICD-10-CM | POA: Diagnosis not present

## 2017-04-27 DIAGNOSIS — N170 Acute kidney failure with tubular necrosis: Secondary | ICD-10-CM

## 2017-04-27 DIAGNOSIS — L02419 Cutaneous abscess of limb, unspecified: Secondary | ICD-10-CM | POA: Diagnosis not present

## 2017-04-27 DIAGNOSIS — R3129 Other microscopic hematuria: Secondary | ICD-10-CM | POA: Diagnosis not present

## 2017-04-27 HISTORY — DX: Chronic kidney disease, stage 3b: N18.32

## 2017-04-27 HISTORY — DX: Acute kidney failure with tubular necrosis: N17.0

## 2017-04-27 LAB — HEPATIC FUNCTION PANEL
ALBUMIN: 2.9 g/dL — AB (ref 3.5–5.0)
ALK PHOS: 57 U/L (ref 38–126)
ALT: 22 U/L (ref 17–63)
AST: 18 U/L (ref 15–41)
BILIRUBIN INDIRECT: 0.8 mg/dL (ref 0.3–0.9)
Bilirubin, Direct: 0.2 mg/dL (ref 0.1–0.5)
TOTAL PROTEIN: 6.7 g/dL (ref 6.5–8.1)
Total Bilirubin: 1 mg/dL (ref 0.3–1.2)

## 2017-04-27 LAB — PROCALCITONIN: Procalcitonin: 0.41 ng/mL

## 2017-04-27 LAB — LIPASE, BLOOD: LIPASE: 18 U/L (ref 11–51)

## 2017-04-27 LAB — BASIC METABOLIC PANEL
ANION GAP: 15 (ref 5–15)
BUN: 46 mg/dL — AB (ref 6–20)
CHLORIDE: 97 mmol/L — AB (ref 101–111)
CO2: 19 mmol/L — AB (ref 22–32)
Calcium: 8.7 mg/dL — ABNORMAL LOW (ref 8.9–10.3)
Creatinine, Ser: 3.71 mg/dL — ABNORMAL HIGH (ref 0.61–1.24)
GFR calc Af Amer: 20 mL/min — ABNORMAL LOW (ref >60)
GFR, EST NON AFRICAN AMERICAN: 17 mL/min — AB (ref >60)
GLUCOSE: 125 mg/dL — AB (ref 65–99)
POTASSIUM: 3.9 mmol/L (ref 3.5–5.1)
Sodium: 131 mmol/L — ABNORMAL LOW (ref 135–145)

## 2017-04-27 LAB — CBC
HEMATOCRIT: 34.3 % — AB (ref 40.0–52.0)
HEMOGLOBIN: 11.4 g/dL — AB (ref 13.0–18.0)
MCH: 29.2 pg (ref 26.0–34.0)
MCHC: 33.2 g/dL (ref 32.0–36.0)
MCV: 88 fL (ref 80.0–100.0)
Platelets: 230 10*3/uL (ref 150–440)
RBC: 3.9 MIL/uL — ABNORMAL LOW (ref 4.40–5.90)
RDW: 13.6 % (ref 11.5–14.5)
WBC: 15.7 10*3/uL — ABNORMAL HIGH (ref 3.8–10.6)

## 2017-04-27 LAB — APTT: APTT: 35 s (ref 24–36)

## 2017-04-27 LAB — PROTIME-INR
INR: 1.65
Prothrombin Time: 19.7 seconds — ABNORMAL HIGH (ref 11.4–15.2)

## 2017-04-27 LAB — LACTIC ACID, PLASMA
LACTIC ACID, VENOUS: 1 mmol/L (ref 0.5–1.9)
Lactic Acid, Venous: 1.8 mmol/L (ref 0.5–1.9)

## 2017-04-27 LAB — TROPONIN I: TROPONIN I: 0.03 ng/mL — AB (ref <0.03)

## 2017-04-27 MED ORDER — MYCOPHENOLATE MOFETIL 500 MG PO TABS
1000.0000 mg | ORAL_TABLET | Freq: Two times a day (BID) | ORAL | Status: DC
  Filled 2017-04-27: qty 2

## 2017-04-27 MED ORDER — LOSARTAN POTASSIUM 50 MG PO TABS
100.0000 mg | ORAL_TABLET | Freq: Every day | ORAL | Status: DC
  Administered 2017-04-28 – 2017-05-03 (×6): 100 mg via ORAL
  Filled 2017-04-27 (×6): qty 2

## 2017-04-27 MED ORDER — HEPARIN SODIUM (PORCINE) 5000 UNIT/ML IJ SOLN: 5000.0000 [IU] | Freq: Three times a day (TID) | INTRAMUSCULAR | Status: DC

## 2017-04-27 MED ORDER — VANCOMYCIN HCL IN DEXTROSE 1-5 GM/200ML-% IV SOLN
1000.0000 mg | Freq: Once | INTRAVENOUS | Status: AC
  Administered 2017-04-27: 1000 mg via INTRAVENOUS
  Filled 2017-04-27: qty 200

## 2017-04-27 MED ORDER — PREDNISOLONE 5 MG PO TABS
10.0000 mg | ORAL_TABLET | Freq: Every day | ORAL | Status: DC
  Filled 2017-04-27: qty 2

## 2017-04-27 MED ORDER — PIPERACILLIN-TAZOBACTAM 3.375 G IVPB
3.3750 g | Freq: Three times a day (TID) | INTRAVENOUS | Status: DC
  Administered 2017-04-28 – 2017-05-03 (×16): 3.375 g via INTRAVENOUS
  Filled 2017-04-27 (×21): qty 50

## 2017-04-27 MED ORDER — IPRATROPIUM-ALBUTEROL 0.5-2.5 (3) MG/3ML IN SOLN
3.0000 mL | Freq: Four times a day (QID) | RESPIRATORY_TRACT | Status: DC
  Administered 2017-04-27: 3 mL via RESPIRATORY_TRACT
  Filled 2017-04-27: qty 3

## 2017-04-27 MED ORDER — VANCOMYCIN HCL IN DEXTROSE 1-5 GM/200ML-% IV SOLN
1000.0000 mg | INTRAVENOUS | Status: DC
  Administered 2017-04-28 – 2017-04-30 (×4): 1000 mg via INTRAVENOUS
  Filled 2017-04-27 (×5): qty 200

## 2017-04-27 MED ORDER — MYCOPHENOLATE MOFETIL 250 MG PO CAPS
1000.0000 mg | ORAL_CAPSULE | Freq: Two times a day (BID) | ORAL | Status: DC
  Administered 2017-04-27 – 2017-05-03 (×12): 1000 mg via ORAL
  Filled 2017-04-27 (×13): qty 4

## 2017-04-27 MED ORDER — HEPARIN (PORCINE) IN NACL 100-0.45 UNIT/ML-% IJ SOLN
1600.0000 [IU]/h | INTRAMUSCULAR | Status: DC
  Administered 2017-04-27: 1800 [IU]/h via INTRAVENOUS
  Administered 2017-04-28: 1700 [IU]/h via INTRAVENOUS
  Administered 2017-04-29: 1600 [IU]/h via INTRAVENOUS
  Filled 2017-04-27 (×6): qty 250

## 2017-04-27 MED ORDER — ROSUVASTATIN CALCIUM 10 MG PO TABS
10.0000 mg | ORAL_TABLET | Freq: Every day | ORAL | Status: DC
  Administered 2017-04-27 – 2017-05-02 (×6): 10 mg via ORAL
  Filled 2017-04-27 (×6): qty 1

## 2017-04-27 MED ORDER — DOCUSATE SODIUM 100 MG PO CAPS
100.0000 mg | ORAL_CAPSULE | Freq: Two times a day (BID) | ORAL | Status: DC | PRN
  Administered 2017-05-01: 100 mg via ORAL
  Filled 2017-04-27 (×2): qty 1

## 2017-04-27 MED ORDER — SODIUM CHLORIDE 0.9 % IV BOLUS (SEPSIS)
1000.0000 mL | INTRAVENOUS | Status: AC
  Administered 2017-04-27: 1000 mL via INTRAVENOUS

## 2017-04-27 MED ORDER — PIPERACILLIN-TAZOBACTAM 3.375 G IVPB 30 MIN
3.3750 g | Freq: Once | INTRAVENOUS | Status: AC
  Administered 2017-04-27: 3.375 g via INTRAVENOUS
  Filled 2017-04-27: qty 50

## 2017-04-27 MED ORDER — APIXABAN 5 MG PO TABS: 5.0000 mg | ORAL_TABLET | Freq: Two times a day (BID) | ORAL | Status: DC

## 2017-04-27 NOTE — ED Notes (Signed)
Unsuccessful IV attempt x 1. Pt tolerated procedure well.

## 2017-04-27 NOTE — ED Notes (Signed)
Pt provided with graham crackers, peanut butter, and sprite 

## 2017-04-27 NOTE — H&P (Signed)
[place] [facility] [place] NAME: Jonathon Snow    MR#:  [id number]  DATE OF BIRTH:  [birth year]  DATE OF ADMISSION:  04/27/2017  PRIMARY CARE PHYSICIAN: Roselee Nova, MD   REQUESTING/REFERRING PHYSICIAN: Karma Greaser  CHIEF COMPLAINT:   Chief Complaint  Patient presents with  . Shortness of Breath  . Dizziness    HISTORY OF PRESENT ILLNESS: Jonathon Snow  is a 56 y.o. male with a known history of Lupus with chronic kidney disease, hypertension, hyperlipidemia, DVT and pulmonary embolism on eliquis- had some flareup of his lupus for last 3-4 weeks and so he is actively following with nephrologist in the office. Today on his follow-up appointment he looked more weak and not in very good shape. On patient's side he complains that he is feeling excessively short of breath with minimal exertion for last few weeks. Concerned with this Dr. Holley Raring decided to send him to emergency room for further evaluation. He was also seen by his pulmonologist Dr. Raul Del in office and he was planning to get a CT scan on his chest done to rule out new pulmonary embolism or other findings. In ER CT scan of the chest was done which showed there is some small abscess in his right axillary area which is spontaneously draining, also noted to have worsening in his renal function.  PAST MEDICAL HISTORY:   Past Medical History:  Diagnosis Date  . DVT (deep venous thrombosis) ([place])   . Hyperlipidemia   . Hypertension   . Lupus   . Pulmonary embolism ([place])   . Renal disorder     PAST SURGICAL HISTORY: Past Surgical History:  Procedure Laterality Date  . ANKLE SURGERY Right   . CYST EXCISION  92 or 93    Liver cyst removal [facility]    SOCIAL HISTORY:  Social History  Substance Use Topics  . Smoking status: Never Smoker  . Smokeless tobacco: Never Used  . Alcohol use No    FAMILY HISTORY:  Family History  Problem Relation Age of Onset  . Hypertension Father     DRUG  ALLERGIES:  Allergies  Allergen Reactions  . Vicodin [Hydrocodone-Acetaminophen] Hives and Other (See Comments)    Rash/hives Other Reaction: severe headaches Rash/hives Other Reaction: severe headaches Rash/hives  . Hydrocodone Rash  . Oxycodone Rash    Takes occasionally.    REVIEW OF SYSTEMS:   CONSTITUTIONAL: No fever, Positive for fatigue or weakness.  EYES: No blurred or double vision.  EARS, NOSE, AND THROAT: No tinnitus or ear pain.  RESPIRATORY: No cough, positive for shortness of breath, no wheezing or hemoptysis.  CARDIOVASCULAR: No chest pain, orthopnea, edema.  GASTROINTESTINAL: No nausea, vomiting, diarrhea or abdominal pain.  GENITOURINARY: No dysuria, hematuria.  ENDOCRINE: No polyuria, nocturia,  HEMATOLOGY: No anemia, easy bruising or bleeding SKIN: No rash or lesion. MUSCULOSKELETAL: No joint pain or arthritis.   NEUROLOGIC: No tingling, numbness, weakness.  PSYCHIATRY: No anxiety or depression.   MEDICATIONS AT HOME:  Prior to Admission medications   Medication Sig Start Date End Date Taking? Authorizing Provider  COMBIVENT RESPIMAT 20-100 MCG/ACT AERS respimat Inhale 1 puff into the lungs daily as needed. 04/14/17  Yes [provider]  ELIQUIS 5 MG TABS tablet TAKE 1 TABLET BY MOUTH 2 TIMES DAILY 12/30/16  Yes Cammie Sickle, MD  losartan (COZAAR) 100 MG tablet Take 100 mg by mouth daily. 09/23/15  Yes [provider]  Multiple Vitamins-Minerals (MULTIVITAMIN WITH MINERALS)  tablet Take 1 tablet by mouth daily.   Yes [provider]  mycophenolate (CELLCEPT) 500 MG tablet Take 1,000 mg by mouth 2 (two) times daily.   Yes [provider]  prednisoLONE 5 MG TABS tablet Take 10 mg by mouth daily.   Yes [provider]  rosuvastatin (CRESTOR) 10 MG tablet Take 1 tablet (10 mg total) by mouth at bedtime. 03/27/17  Yes Rochel Brome A, MD      PHYSICAL EXAMINATION:   VITAL SIGNS: Blood pressure (!) 147/94,  pulse 82, temperature 98.9 F (37.2 C), temperature source Oral, resp. rate 20, height 6\' 2"  (1.88 m), weight 107 kg (236 lb), SpO2 100 %.  GENERAL:  56 y.o.-year-old patient lying in the bed with no acute distress.  EYES: Pupils equal, round, reactive to light and accommodation. No scleral icterus. Extraocular muscles intact.  HEENT: Head atraumatic, normocephalic. Oropharynx and nasopharynx clear.  NECK:  Supple, no jugular venous distention. No thyroid enlargement, no tenderness.  LUNGS: Normal breath sounds bilaterally, no wheezing, Some crepitation. No use of accessory muscles of respiration.  CARDIOVASCULAR: S1, S2 normal. No murmurs, rubs, or gallops.  ABDOMEN: Soft, nontender, nondistended. Bowel sounds present. No organomegaly or mass.  EXTREMITIES: No pedal edema, cyanosis, or clubbing. On the right axilla there is a 3-4 cm sized palpable mass present with yellowish puslike drainage from there. NEUROLOGIC: Cranial nerves II through XII are intact. Muscle strength 4/5 in all extremities. Sensation intact. Gait not checked.  PSYCHIATRIC: The patient is alert and oriented x 3.  SKIN: No obvious rash, lesion, or ulcer.   LABORATORY PANEL:   CBC  Recent Labs Lab 04/27/17 1055  WBC 15.7*  HGB 11.4*  HCT 34.3*  PLT 230  MCV 88.0  MCH 29.2  MCHC 33.2  RDW 13.6   ------------------------------------------------------------------------------------------------------------------  Chemistries   Recent Labs Lab 04/27/17 1055 04/27/17 1559  NA 131*  --   K 3.9  --   CL 97*  --   CO2 19*  --   GLUCOSE 125*  --   BUN 46*  --   CREATININE 3.71*  --   CALCIUM 8.7*  --   AST  --  18  ALT  --  22  ALKPHOS  --  57  BILITOT  --  1.0   ------------------------------------------------------------------------------------------------------------------ estimated creatinine clearance is 29.3 mL/min (A) (by C-G formula based on SCr of 3.71 mg/dL  (H)). ------------------------------------------------------------------------------------------------------------------ No results for input(s): TSH, T4TOTAL, T3FREE, THYROIDAB in the last 72 hours.  Invalid input(s): FREET3   Coagulation profile  Recent Labs Lab 04/27/17 1559  INR 1.65   ------------------------------------------------------------------------------------------------------------------- No results for input(s): DDIMER in the last 72 hours. -------------------------------------------------------------------------------------------------------------------  Cardiac Enzymes  Recent Labs Lab 04/27/17 1559  TROPONINI 0.03*   ------------------------------------------------------------------------------------------------------------------ Invalid input(s): POCBNP  ---------------------------------------------------------------------------------------------------------------  Urinalysis    Component Value Date/Time   COLORURINE YELLOW (A) 10/01/2015 1511   APPEARANCEUR CLEAR (A) 10/01/2015 1511   APPEARANCEUR Clear 04/10/2015 1036   LABSPEC 1.016 10/01/2015 1511   LABSPEC 1.013 04/10/2015 1036   PHURINE 5.0 10/01/2015 1511   GLUCOSEU NEGATIVE 10/01/2015 1511   GLUCOSEU Negative 04/10/2015 1036   HGBUR 3+ (A) 10/01/2015 1511   BILIRUBINUR NEGATIVE 10/01/2015 1511   BILIRUBINUR Negative 04/10/2015 1036   KETONESUR NEGATIVE 10/01/2015 1511   PROTEINUR NEGATIVE 10/01/2015 1511   UROBILINOGEN 0.2 03/17/2015 0025   NITRITE NEGATIVE 10/01/2015 1511   LEUKOCYTESUR NEGATIVE 10/01/2015 1511   LEUKOCYTESUR Negative 04/10/2015 1036     RADIOLOGY:  Ct Chest Wo Contrast  Result Date: 04/27/2017 CLINICAL DATA:  Worsening dyspnea. 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 EXAM: CT CHEST WITHOUT CONTRAST TECHNIQUE: Multidetector CT imaging of the chest was performed following the standard protocol without IV contrast. COMPARISON:  10/01/2015 chest CT angiogram. FINDINGS:  Cardiovascular: Normal heart size. No significant pericardial fluid/thickening. Left anterior descending, left circumflex and right coronary atherosclerosis. Great vessels are normal in course and caliber. Mediastinum/Nodes: No discrete thyroid nodules. Unremarkable esophagus. There is focal skin thickening in the right axilla. There are multiple newly enlarged lymph nodes in the right axilla, largest measuring 2.1 cm short axis (series 2/ image 58). There is associated extensive fat stranding throughout the right axilla. An irregular 4.3 x 2.4 cm fluid density collection in the deep subcutaneous right axillary region (series 2/image 50) is new. No left axillary adenopathy. No pathologically enlarged mediastinal or gross hilar nodes on this noncontrast scan. Lungs/Pleura: No pneumothorax. No pleural effusion. No acute consolidative airspace disease, lung masses or significant pulmonary nodules. Stable parenchymal bands in the peripheral anterior right lower lobe compatible with postinfectious/postinflammatory scarring. Upper abdomen: Lobulated 8.8 x 8.0 cm cystic lesion in the posterior right liver lobe (series 2/ image 130) with clustered eccentric coarse mural calcifications, which measured 8.5 x 7.6 cm on 07/20/2013 and 7.9 x 6.7 cm on 10/01/2015, mildly increased in size. Musculoskeletal: No aggressive appearing focal osseous lesions. Moderate thoracic spondylosis. IMPRESSION: 1. New right axillary adenopathy with surrounding fat stranding and overlying right axillary skin thickening, and with an irregular 4.3 x 2.4 cm subcutaneous right axillary fluid collection, which could represent an abscess (not well evaluated on this noncontrast scan). Surgical consultation should be considered. 2. No acute pulmonary disease. 3. Three-vessel coronary atherosclerosis. 4. 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 This mass was characterized as multiloculated and benign appearing on 12/20/2009 MRI abdomen. Electronically Signed   By: Ilona Sorrel M.D.   On: 04/27/2017 15:41    EKG: Orders placed or performed during the hospital encounter of 04/27/17  . ED EKG  . ED EKG  . EKG 12-Lead  . EKG 12-Lead    IMPRESSION AND PLAN:  * Acute on chronic renal failure   Secondary to lupus    Currently he is on steroids 10 mg oral prednisone daily and CellCept, I will continue that here.   Nephrology consult for further management.  * Right axillary abscess   As per conversation by ER physician with surgical physician, no drainage may be required as the abscess is draining by itself.   We'll continue IV antibiotic and if needed we may have to call surgical consult for follow-up.  * Lupus   As above.  * History of DVT and PE   Continue anticoagulation.  * Hypertension   Continue losartan.    All the records are reviewed and case discussed with ED provider. Management plans discussed with the patient, family and they are in agreement.  CODE STATUS:Full code.  Code Status History    This patient does not have a recorded code status. Please follow your organizational policy for patients in this situation.       TOTAL TIME TAKING CARE OF THIS PATIENT: 45  minutes.    Vaughan Basta M.D on 04/27/2017   Between 7am to 6pm - Pager - [phone]  After 6pm go to [email or web address] - password  [email or web address]  [facility]  Office  [phone]  CC: Primary care physician; Roselee Nova, MD   Note: This dictation was prepared with Dragon dictation along with smaller phrase technology. Any transcriptional errors that result from this process are unintentional.

## 2017-04-27 NOTE — Progress Notes (Signed)
ANTICOAGULATION CONSULT NOTE - Initial Consult  Pharmacy Consult for heparin dosing Indication: pulmonary embolus  Allergies  Allergen Reactions  . Vicodin [Hydrocodone-Acetaminophen] Hives and Other (See Comments)    Rash/hives Other Reaction: severe headaches Rash/hives Other Reaction: severe headaches Rash/hives  . Hydrocodone Rash  . Oxycodone Rash    Takes occasionally.    Patient Measurements: Height: 6\' 2"  (188 cm) Weight: 236 lb (107 kg) IBW/kg (Calculated) : 82.2 Heparin Dosing Weight: 104 kg  Vital Signs: Temp: 97.8 F (36.6 C) (05/07 2145) Temp Source: Oral (05/07 2145) BP: 107/59 (05/07 2145) Pulse Rate: 106 (05/07 2145)  Labs:  Recent Labs  04/27/17 1055 04/27/17 1559  HGB 11.4*  --   HCT 34.3*  --   PLT 230  --   LABPROT  --  19.7*  INR  --  1.65  CREATININE 3.71*  --   TROPONINI  --  0.03*    Estimated Creatinine Clearance: 29.3 mL/min (A) (by C-G formula based on SCr of 3.71 mg/dL (H)).   Medical History: Past Medical History:  Diagnosis Date  . DVT (deep venous thrombosis) ([place])   . Hyperlipidemia   . Hypertension   . Lupus   . Pulmonary embolism ([place])   . Renal disorder     Medications:  Scheduled:  . ipratropium-albuterol  3 mL Inhalation Q6H  . losartan  100 mg Oral Daily  . mycophenolate  1,000 mg Oral BID  . [START ON 04/28/2017] prednisoLONE  10 mg Oral Daily  . rosuvastatin  10 mg Oral QHS    Assessment: Patient admitted for R axillary abscess w/ h/o DVT and PE anticoagulated w/ apixaban (although given current situation appears to have failed anticoagulation w/ eliquis).  Pt. Is being started on heparin drip  Goal of Therapy:  Heparin level 0.3-0.7 units/ml Monitor platelets by anticoagulation protocol: Yes   Plan:  Patient's last eliquis dose on 5/7 @ 0830. Will omit bolus and start heparin drip 1800 units/hr and draw HL @ 0400. Baseline labs drawn.  Thank you for this consult.  Tobie Lords, PharmD,  BCPS Clinical Pharmacist 04/27/2017

## 2017-04-27 NOTE — Progress Notes (Signed)
Pharmacy Antibiotic Note  Jonathon Snow is a 56 y.o. male admitted on 04/27/2017 with sepsis.  Pharmacy has been consulted for Zosyn and vancomycin dosing.  Plan: 1. Zosyn 3.375 gm IV Q8H EI 2. Vancomycin 1 gm IV x 1 in ED followed in approximately 6 hours (stacked dosing) by vancomycin 1 gm IV Q24H, predicted trough 16 mcg/mL. Pharmacy will continue to follow and adjust as needed to maintain trough 15 to 20 mcg/ml.   Vd 64.5 L, Ke 0.029 hr-1, T1/2 24.1 hr  Height: 6\' 2"  (188 cm) Weight: 236 lb (107 kg) IBW/kg (Calculated) : 82.2  Temp (24hrs), Avg:98.9 F (37.2 C), Min:98.9 F (37.2 C), Max:98.9 F (37.2 C)   Recent Labs Lab 04/27/17 1055  WBC 15.7*  CREATININE 3.71*    Estimated Creatinine Clearance: 29.3 mL/min (A) (by C-G formula based on SCr of 3.71 mg/dL (H)).    Allergies  Allergen Reactions  . Vicodin [Hydrocodone-Acetaminophen] Hives and Other (See Comments)    Rash/hives Other Reaction: severe headaches Rash/hives Other Reaction: severe headaches Rash/hives  . Hydrocodone Rash  . Oxycodone Rash    Takes occasionally.    Thank you for allowing pharmacy to be a part of this patient's care.  Laural Benes, Pharm.D., BCPS Clinical Pharmacist 04/27/2017 3:09 PM
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
## 2017-04-27 NOTE — Consult Note (Signed)
Patient ID: Jonathon Snow, male   DOB: 12-Apr-1961, 56 y.o.   MRN: [id number]  HPI Jonathon Snow is a 56 y.o. male asked to see in consultation By Dr. Anselm Jungling for a right axillary abscess. He was admitted to the hospital secondary to shortness of breath and dizziness and exacerbation of his kidney insufficiency from his lupus. Of note the patient has a significant history of lupus disease, DVT and PE, hypertension and chronic renal insufficiency that is bandaged as an outpatient by Dr. Holley Raring. He reports that over the last few weeks and there is being fair to thrive, some dizziness and some shortness of breath. He also reports that over the last week or so he experienced some right axillary pain that is moderate in intensity and intermittent. He describes his pain as a sharp pain. 2 days ago there is being some purulent drainage from the axilla. He did have some crackers and some Sprite couple of hours ago. He denies fevers, and he reports weakness, vomiting and diarrhea. His symptoms have been present for the last month or so. He was also recently placed back on CellCept.  CT scan of the chest I have personally reviewed there is evidence of significant inflammatory response on the right axilla. There is an area that is not classic for abscesses but cannot rule out. And this looks more like an inflammatory response. There is no evidence of necrotizing soft tissue infection. His white count is 15,000 with a left shift and his creatinine is 3.7 From his baseline of 2.2, albumin is 2.9. Last dose of Eliquis was this  morning  HPI  Past Medical History:  Diagnosis Date  . DVT (deep venous thrombosis) ([place])   . Hyperlipidemia   . Hypertension   . Lupus   . Pulmonary embolism ([place])   . Renal disorder     Past Surgical History:  Procedure Laterality Date  . ANKLE SURGERY Right   . CYST EXCISION  92 or 93    Liver cyst removal [facility]    Family History  Problem Relation Age of Onset  .  Hypertension Father     Social History Social History  Substance Use Topics  . Smoking status: Never Smoker  . Smokeless tobacco: Never Used  . Alcohol use No    Allergies  Allergen Reactions  . Vicodin [Hydrocodone-Acetaminophen] Hives and Other (See Comments)    Rash/hives Other Reaction: severe headaches Rash/hives Other Reaction: severe headaches Rash/hives  . Hydrocodone Rash  . Oxycodone Rash    Takes occasionally.    Current Facility-Administered Medications  Medication Dose Route Frequency Provider Last Rate Last Dose  . apixaban (ELIQUIS) tablet 5 mg  5 mg Oral BID Vaughan Basta, MD      . docusate sodium (COLACE) capsule 100 mg  100 mg Oral BID PRN Vaughan Basta, MD      . ipratropium-albuterol (DUONEB) 0.5-2.5 (3) MG/3ML nebulizer solution 3 mL  3 mL Inhalation Q6H Vaughan Basta, MD   3 mL at 04/27/17 1918  . losartan (COZAAR) tablet 100 mg  100 mg Oral Daily Vaughan Basta, MD      . mycophenolate (CELLCEPT) capsule 1,000 mg  1,000 mg Oral BID Vaughan Basta, MD      . piperacillin-tazobactam (ZOSYN) IVPB 3.375 g  3.375 g Intravenous Q8H Hinda Kehr, MD      . Derrill Memo ON 04/28/2017] prednisoLONE tablet 10 mg  10 mg Oral Daily Vaughan Basta, MD      . rosuvastatin (CRESTOR)  tablet 10 mg  10 mg Oral QHS Vaughan Basta, MD      . Derrill Memo ON 04/28/2017] vancomycin (VANCOCIN) IVPB 1000 mg/200 mL premix  1,000 mg Intravenous Q24H Hinda Kehr, MD         Review of Systems Full ROS  was asked and was negative except for the information on the HPI  Physical Exam Blood pressure (!) 147/80, pulse 95, temperature 98.8 F (37.1 C), temperature source Oral, resp. rate (!) 22, height 6\' 2"  (1.88 m), weight 107 kg (236 lb), SpO2 99 %. CONSTITUTIONAL:NAd non toxic EYES: Pupils are equal, round, and reactive to light, Sclera are non-icteric. EARS, NOSE, MOUTH AND THROAT: The oropharynx is clear. The oral mucosa is pink  and moist. Hearing is intact to voice. LYMPH NODES:  Lymph nodes in the neck are normal. RESPIRATORY:  Lungs are clear. There is normal respiratory effort, with equal breath sounds bilaterally, and without pathologic use of accessory muscles. CARDIOVASCULAR: Heart is regular without murmurs, gallops, or rubs. GI: The abdomen is  soft, nontender, and nondistended. There are no palpable masses. There is no hepatosplenomegaly. There are normal bowel sounds in all quadrants. GU: Rectal deferred.   MUSCULOSKELETAL: Normal muscle strength and tone. No cyanosis or edema.   SKIN:  There is some induration in the right axilla with an opening measuring about 1 cm x 0.5 cm with purulent drainage. No evidence of definitive fluctuance. There is tenderness to palpation. There is no evidence of necrotizing infection. There is minimal cellulitis. NEUROLOGIC: Motor and sensation is grossly normal. Cranial nerves are grossly intact. PSYCH:  Oriented to person, place and time. Affect is normal.  Data Reviewed  I have personally reviewed the patient's imaging, laboratory findings and medical records.    Assessment/Plan Right axillary cellulitis and resolving abscess in a patient that is currently on steroids and CellCept for. Given the fact the patient is already feeling better after a spontaneously drained and there is no definitive evidence of undrained collections of the physical exam and week in reassess him next following days for the need for debridement. Patient currently is on anticoagulation and I will discuss with Dr. Jannifer Franklin from the hospitalist service to transition him to a heparin drip in case we need to do A debridement. There is no evidence of necrotizing infection that mandate emergent surgical intervention at this time. My partner Dr. Adonis Huguenin will continue to follow him on a daily basis. I do agree with broad antibiotic coverage including Zosyn and vancomycin.   Caroleen Hamman, MD FACS General  Surgeon 04/27/2017, 9:13 PM

## 2017-04-27 NOTE — ED Triage Notes (Signed)
PT sent here by Dr Holley Raring for increased weakness, joint pain, shortness of breath, vomiting, diarrhea and an abscess to right axilla.

## 2017-04-28 ENCOUNTER — Encounter: Payer: Self-pay | Admitting: *Deleted

## 2017-04-28 LAB — BASIC METABOLIC PANEL
Anion gap: 9 (ref 5–15)
BUN: 51 mg/dL — ABNORMAL HIGH (ref 6–20)
CALCIUM: 7.9 mg/dL — AB (ref 8.9–10.3)
CHLORIDE: 102 mmol/L (ref 101–111)
CO2: 23 mmol/L (ref 22–32)
CREATININE: 3.76 mg/dL — AB (ref 0.61–1.24)
GFR calc Af Amer: 19 mL/min — ABNORMAL LOW (ref >60)
GFR calc non Af Amer: 17 mL/min — ABNORMAL LOW (ref >60)
GLUCOSE: 111 mg/dL — AB (ref 65–99)
Potassium: 4 mmol/L (ref 3.5–5.1)
Sodium: 134 mmol/L — ABNORMAL LOW (ref 135–145)

## 2017-04-28 LAB — BLOOD CULTURE ID PANEL (REFLEXED)
ACINETOBACTER BAUMANNII: NOT DETECTED
CANDIDA ALBICANS: NOT DETECTED
CANDIDA GLABRATA: NOT DETECTED
Candida krusei: NOT DETECTED
Candida parapsilosis: NOT DETECTED
Candida tropicalis: NOT DETECTED
ENTEROBACTER CLOACAE COMPLEX: NOT DETECTED
ENTEROBACTERIACEAE SPECIES: NOT DETECTED
Enterococcus species: NOT DETECTED
Escherichia coli: NOT DETECTED
Haemophilus influenzae: NOT DETECTED
KLEBSIELLA PNEUMONIAE: NOT DETECTED
Klebsiella oxytoca: NOT DETECTED
Listeria monocytogenes: NOT DETECTED
NEISSERIA MENINGITIDIS: NOT DETECTED
PSEUDOMONAS AERUGINOSA: NOT DETECTED
Proteus species: NOT DETECTED
STREPTOCOCCUS AGALACTIAE: NOT DETECTED
STREPTOCOCCUS PNEUMONIAE: NOT DETECTED
STREPTOCOCCUS PYOGENES: NOT DETECTED
Serratia marcescens: NOT DETECTED
Staphylococcus aureus (BCID): NOT DETECTED
Staphylococcus species: NOT DETECTED
Streptococcus species: NOT DETECTED

## 2017-04-28 LAB — HEPARIN LEVEL (UNFRACTIONATED)
HEPARIN UNFRACTIONATED: 1.8 [IU]/mL — AB (ref 0.30–0.70)
Heparin Unfractionated: 1.5 IU/mL — ABNORMAL HIGH (ref 0.30–0.70)
Heparin Unfractionated: 1.36 IU/mL — ABNORMAL HIGH (ref 0.30–0.70)

## 2017-04-28 LAB — PROTEIN / CREATININE RATIO, URINE
Creatinine, Urine: 74 mg/dL
PROTEIN CREATININE RATIO: 0.73 mg/mg{creat} — AB (ref 0.00–0.15)
TOTAL PROTEIN, URINE: 54 mg/dL

## 2017-04-28 LAB — URINALYSIS, COMPLETE (UACMP) WITH MICROSCOPIC
BILIRUBIN URINE: NEGATIVE
Bacteria, UA: NONE SEEN
GLUCOSE, UA: NEGATIVE mg/dL
KETONES UR: NEGATIVE mg/dL
LEUKOCYTES UA: NEGATIVE
NITRITE: NEGATIVE
PH: 5 (ref 5.0–8.0)
Protein, ur: 30 mg/dL — AB
Specific Gravity, Urine: 1.008 (ref 1.005–1.030)
Squamous Epithelial / LPF: NONE SEEN

## 2017-04-28 LAB — APTT
APTT: 90 s — AB (ref 24–36)
APTT: 98 s — AB (ref 24–36)
aPTT: 114 seconds — ABNORMAL HIGH (ref 24–36)

## 2017-04-28 LAB — CBC
HCT: 28.1 % — ABNORMAL LOW (ref 40.0–52.0)
Hemoglobin: 9.8 g/dL — ABNORMAL LOW (ref 13.0–18.0)
MCH: 31.1 pg (ref 26.0–34.0)
MCHC: 34.9 g/dL (ref 32.0–36.0)
MCV: 89.1 fL (ref 80.0–100.0)
PLATELETS: 213 10*3/uL (ref 150–440)
RBC: 3.15 MIL/uL — ABNORMAL LOW (ref 4.40–5.90)
RDW: 13.5 % (ref 11.5–14.5)
WBC: 10.8 10*3/uL — ABNORMAL HIGH (ref 3.8–10.6)

## 2017-04-28 LAB — HIV ANTIBODY (ROUTINE TESTING W REFLEX): HIV Screen 4th Generation wRfx: NONREACTIVE

## 2017-04-28 MED ORDER — DIPHENHYDRAMINE HCL 50 MG/ML IJ SOLN: 12.5000 mg | Freq: Every evening | INTRAMUSCULAR | Status: DC | PRN

## 2017-04-28 MED ORDER — IPRATROPIUM-ALBUTEROL 0.5-2.5 (3) MG/3ML IN SOLN: 3.0000 mL | RESPIRATORY_TRACT | Status: DC | PRN

## 2017-04-28 MED ORDER — PREDNISOLONE SODIUM PHOSPHATE 15 MG/5ML PO SOLN
10.0000 mg | Freq: Every day | ORAL | Status: DC
  Administered 2017-04-28 – 2017-05-03 (×6): 10 mg via ORAL
  Filled 2017-04-28 (×6): qty 5

## 2017-04-28 MED ORDER — MORPHINE SULFATE (PF) 2 MG/ML IV SOLN
1.0000 mg | INTRAVENOUS | Status: DC | PRN
  Administered 2017-04-28 – 2017-04-30 (×4): 1 mg via INTRAVENOUS
  Filled 2017-04-28 (×4): qty 1

## 2017-04-28 NOTE — Progress Notes (Signed)
ANTICOAGULATION CONSULT NOTE - Initial Consult  Pharmacy Consult for heparin dosing Indication: pulmonary embolus       Allergies  Allergen Reactions  . Vicodin [Hydrocodone-Acetaminophen] Hives and Other (See Comments)    Rash/hives Other Reaction: severe headaches Rash/hives Other Reaction: severe headaches Rash/hives  . Hydrocodone Rash  . Oxycodone Rash    Takes occasionally.    Patient Measurements: Height: 6\' 2"  (188 cm) Weight: 236 lb (107 kg) IBW/kg (Calculated) : 82.2 Heparin Dosing Weight: 104 kg  Vital Signs: Temp: 97.8 F (36.6 C) (05/07 2145) Temp Source: Oral (05/07 2145) BP: 107/59 (05/07 2145) Pulse Rate: 106 (05/07 2145)  Labs:  Recent Labs (last 2 labs)    Recent Labs  04/27/17 1055 04/27/17 1559  HGB 11.4*  --   HCT 34.3*  --   PLT 230  --   LABPROT  --  19.7*  INR  --  1.65  CREATININE 3.71*  --   TROPONINI  --  0.03*      Estimated Creatinine Clearance: 29.3 mL/min (A) (by C-G formula based on SCr of 3.71 mg/dL (H)).   Medical History:     Past Medical History:  Diagnosis Date  . DVT (deep venous thrombosis) ([place])   . Hyperlipidemia   . Hypertension   . Lupus   . Pulmonary embolism ([place])   . Renal disorder     Medications:  Scheduled:  . ipratropium-albuterol  3 mL Inhalation Q6H  . losartan  100 mg Oral Daily  . mycophenolate  1,000 mg Oral BID  . [START ON 04/28/2017] prednisoLONE  10 mg Oral Daily  . rosuvastatin  10 mg Oral QHS    Assessment: Patient admitted for R axillary abscess w/ h/o DVT and PE anticoagulated w/ apixaban (although given current situation appears to have failed anticoagulation w/ eliquis).  Pt. Is being started on heparin drip  Goal of Therapy:  Heparin level 0.3-0.7 units/ml Monitor platelets by anticoagulation protocol: Yes   Plan:  Patient's last eliquis dose on 5/7 @ 0830. Will omit bolus and start heparin drip 1800 units/hr and draw HL @ 0400. Baseline labs  drawn.  5/8 @ 0400 HL 1.50, aPTT 90. Since HL and aPTT are not correlating will dose off of aPTT (goal aPTT 66 -109 sec). Current aPTT is therapeutic, will continue current regimen and recheck next HL/aPTT @ 1000. CBC trending down, monitor closely.  Thank you for this consult.  Tobie Lords, PharmD, BCPS Clinical Pharmacist 04/28/2017

## 2017-04-28 NOTE — Care Management (Signed)
Sent to ED from nephrologist office for weakness and shortness of breath. Surgery consult in progress for abscess right axilla. Nephrology to consult. Patient does have dx of chronic renal failure.  Labs on admission show worsening renal function since results of 03/2017.  Chronic Eliquis for pulmonary embolus.  Independent in all adls, denies issues accessing medical care, obtaining medications or with transportation.  Current with PCP.

## 2017-04-28 NOTE — Progress Notes (Signed)
PHARMACY - PHYSICIAN COMMUNICATION CRITICAL VALUE ALERT - BLOOD CULTURE IDENTIFICATION (BCID)  Results for orders placed or performed during the hospital encounter of 04/27/17  Blood Culture ID Panel (Reflexed) (Collected: 04/27/2017  4:15 PM)  Result Value Ref Range   Enterococcus species NOT DETECTED NOT DETECTED   Listeria monocytogenes NOT DETECTED NOT DETECTED   Staphylococcus species NOT DETECTED NOT DETECTED   Staphylococcus aureus NOT DETECTED NOT DETECTED   Streptococcus species NOT DETECTED NOT DETECTED   Streptococcus agalactiae NOT DETECTED NOT DETECTED   Streptococcus pneumoniae NOT DETECTED NOT DETECTED   Streptococcus pyogenes NOT DETECTED NOT DETECTED   Acinetobacter baumannii NOT DETECTED NOT DETECTED   Enterobacteriaceae species NOT DETECTED NOT DETECTED   Enterobacter cloacae complex NOT DETECTED NOT DETECTED   Escherichia coli NOT DETECTED NOT DETECTED   Klebsiella oxytoca NOT DETECTED NOT DETECTED   Klebsiella pneumoniae NOT DETECTED NOT DETECTED   Proteus species NOT DETECTED NOT DETECTED   Serratia marcescens NOT DETECTED NOT DETECTED   Haemophilus influenzae NOT DETECTED NOT DETECTED   Neisseria meningitidis NOT DETECTED NOT DETECTED   Pseudomonas aeruginosa NOT DETECTED NOT DETECTED   Candida albicans NOT DETECTED NOT DETECTED   Candida glabrata NOT DETECTED NOT DETECTED   Candida krusei NOT DETECTED NOT DETECTED   Candida parapsilosis NOT DETECTED NOT DETECTED   Candida tropicalis NOT DETECTED NOT DETECTED   Blood Culture with GPR in aerobic bottle in one set.  Name of physician (or Provider) Contacted: Gouru  Changes to prescribed antibiotics required: Continue with current therapy  Paulina Fusi, PharmD, BCPS 04/28/2017 12:08 PM

## 2017-04-28 NOTE — Progress Notes (Signed)
Patient states he takes an inhaler at home prn. Patient did not want to be woke for breathing treatments

## 2017-04-28 NOTE — Progress Notes (Signed)
[place], [place] 04/28/17  Subjective:  Patient known to our practice from outpatient.  He is followed for lupus nephritis.  He's currently being treated with CellCept and prednisone.  He presents for evaluation of right axillary abscess.  It drained spontaneously therefore no surgical intervention was required. He is also noted to have increased creatinine His baseline appears to be 1.25 from December 2017.  As outpatient, on April 10, creatinine was 1.88  Admission creatinine is noted to be 3.71 To his creatinine is about the same at 3.76  Urine continues to show large amount of blood, too numerous to count RBCs, 6-30 WBCs, Urine protein to creatinine ratio 0.73   Objective:  Vital signs in last 24 hours:  Temp:  [97.8 F (36.6 C)-98.8 F (37.1 C)] 98.2 F (36.8 C) (05/08 1237) Pulse Rate:  [82-106] 94 (05/08 1237) Resp:  [16-22] 20 (05/08 1237) BP: (107-158)/(59-98) 138/71 (05/08 1237) SpO2:  [95 %-100 %] 95 % (05/08 1237)  Weight change:  Filed Weights   04/27/17 1056  Weight: 107 kg (236 lb)    Intake/Output:    Intake/Output Summary (Last 24 hours) at 04/28/17 1458 Last data filed at 04/28/17 1359  Gross per 24 hour  Intake          2414.89 ml  Output              700 ml  Net          1714.89 ml     Physical Exam: General: No acute distress, lying in the bed  HEENT Anicteric, moist oral mucous membranes  Neck supple  Pulm/lungs Normal breathing effort, clear to auscultation  CVS/Heart Regular, no rub or gallop  Abdomen:  Soft, nontender, nondistended  Extremities: Trace edema  Neurologic: Alert, oriented  Skin: Right axillary abscess, bandage in place          Basic Metabolic Panel:   Recent Labs Lab 04/27/17 1055 04/28/17 0406  NA 131* 134*  K 3.9 4.0  CL 97* 102  CO2 19* 23  GLUCOSE 125* 111*  BUN 46* 51*  CREATININE 3.71* 3.76*  CALCIUM 8.7* 7.9*     CBC:  Recent Labs Lab 04/27/17 1055  04/28/17 0406  WBC 15.7* 10.8*  HGB 11.4* 9.8*  HCT 34.3* 28.1*  MCV 88.0 89.1  PLT 230 213     No results found for: HEPBSAG, HEPBSAB, HEPBIGM    Microbiology:  Recent Results (from the past 240 hour(s))  Blood Culture (routine x 2)     Status: None (Preliminary result)   Collection Time: 04/27/17  3:58 PM  Result Value Ref Range Status   Specimen Description BLOOD LEFT HAND  Final   Special Requests   Final    BOTTLES DRAWN AEROBIC AND ANAEROBIC Blood Culture adequate volume   Culture NO GROWTH < 24 HOURS  Final   Report Status PENDING  Incomplete  Aerobic/Anaerobic Culture (surgical/deep wound)     Status: None (Preliminary result)   Collection Time: 04/27/17  3:59 PM  Result Value Ref Range Status   Specimen Description WOUND  Final   Special Requests RIGHT AXILLA  Final   Gram Stain   Final    ABUNDANT WBC PRESENT, PREDOMINANTLY MONONUCLEAR ABUNDANT GRAM POSITIVE COCCI IN CLUSTERS    Culture   Final    TOO YOUNG TO READ Performed at [facility], [address], [place], [place] [address]    Report Status PENDING  Incomplete  Blood Culture (  routine x 2)     Status: None (Preliminary result)   Collection Time: 04/27/17  4:15 PM  Result Value Ref Range Status   Specimen Description BLOOD LEFT HAND  Final   Special Requests   Final    BOTTLES DRAWN AEROBIC AND ANAEROBIC Blood Culture adequate volume   Culture  Setup Time   Final    Organism ID to follow GRAM POSITIVE RODS AEROBIC BOTTLE ONLY RESULT CALLED TO, READ BACK BY AND VERIFIED WITH: CHRISTINE KATSOUDAS AT 4332 ON 04/28/17 [place].    Culture GRAM POSITIVE RODS  Final   Report Status PENDING  Incomplete  Blood Culture ID Panel (Reflexed)     Status: None   Collection Time: 04/27/17  4:15 PM  Result Value Ref Range Status   Enterococcus species NOT DETECTED NOT DETECTED Final   Listeria monocytogenes NOT DETECTED NOT DETECTED Final   Staphylococcus species NOT DETECTED NOT DETECTED Final    Staphylococcus aureus NOT DETECTED NOT DETECTED Final   Streptococcus species NOT DETECTED NOT DETECTED Final   Streptococcus agalactiae NOT DETECTED NOT DETECTED Final   Streptococcus pneumoniae NOT DETECTED NOT DETECTED Final   Streptococcus pyogenes NOT DETECTED NOT DETECTED Final   Acinetobacter baumannii NOT DETECTED NOT DETECTED Final   Enterobacteriaceae species NOT DETECTED NOT DETECTED Final   Enterobacter cloacae complex NOT DETECTED NOT DETECTED Final   Escherichia coli NOT DETECTED NOT DETECTED Final   Klebsiella oxytoca NOT DETECTED NOT DETECTED Final   Klebsiella pneumoniae NOT DETECTED NOT DETECTED Final   Proteus species NOT DETECTED NOT DETECTED Final   Serratia marcescens NOT DETECTED NOT DETECTED Final   Haemophilus influenzae NOT DETECTED NOT DETECTED Final   Neisseria meningitidis NOT DETECTED NOT DETECTED Final   Pseudomonas aeruginosa NOT DETECTED NOT DETECTED Final   Candida albicans NOT DETECTED NOT DETECTED Final   Candida glabrata NOT DETECTED NOT DETECTED Final   Candida krusei NOT DETECTED NOT DETECTED Final   Candida parapsilosis NOT DETECTED NOT DETECTED Final   Candida tropicalis NOT DETECTED NOT DETECTED Final    Coagulation Studies:  Recent Labs  04/27/17 1559  LABPROT 19.7*  INR 1.65    Urinalysis:  Recent Labs  04/28/17 1035  COLORURINE YELLOW*  LABSPEC 1.008  PHURINE 5.0  GLUCOSEU NEGATIVE  HGBUR LARGE*  BILIRUBINUR NEGATIVE  KETONESUR NEGATIVE  PROTEINUR 30*  NITRITE NEGATIVE  LEUKOCYTESUR NEGATIVE      Imaging: Ct Chest Wo Contrast  Result Date: 04/27/2017 CLINICAL DATA:  Worsening dyspnea. 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 EXAM: CT CHEST WITHOUT CONTRAST TECHNIQUE: Multidetector CT imaging of the chest was performed following the standard protocol without IV contrast. COMPARISON:  10/01/2015 chest CT angiogram. FINDINGS: Cardiovascular: Normal heart size. No significant pericardial fluid/thickening. Left anterior  descending, left circumflex and right coronary atherosclerosis. Great vessels are normal in course and caliber. Mediastinum/Nodes: No discrete thyroid nodules. Unremarkable esophagus. There is focal skin thickening in the right axilla. There are multiple newly enlarged lymph nodes in the right axilla, largest measuring 2.1 cm short axis (series 2/ image 58). There is associated extensive fat stranding throughout the right axilla. An irregular 4.3 x 2.4 cm fluid density collection in the deep subcutaneous right axillary region (series 2/image 50) is new. No left axillary adenopathy. No pathologically enlarged mediastinal or gross hilar nodes on this noncontrast scan. Lungs/Pleura: No pneumothorax. No pleural effusion. No acute consolidative airspace disease, lung masses or significant pulmonary nodules. Stable parenchymal bands in the peripheral anterior right lower lobe compatible with  postinfectious/postinflammatory scarring. Upper abdomen: Lobulated 8.8 x 8.0 cm cystic lesion in the posterior right liver lobe (series 2/ image 130) with clustered eccentric coarse mural calcifications, which measured 8.5 x 7.6 cm on 07/20/2013 and 7.9 x 6.7 cm on 10/01/2015, mildly increased in size. Musculoskeletal: No aggressive appearing focal osseous lesions. Moderate thoracic spondylosis. IMPRESSION: 1. New right axillary adenopathy with surrounding fat stranding and overlying right axillary skin thickening, and with an irregular 4.3 x 2.4 cm subcutaneous right axillary fluid collection, which could represent an abscess (not well evaluated on this noncontrast scan). Surgical consultation should be considered. 2. No acute pulmonary disease. 3. Three-vessel coronary atherosclerosis. 4. 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 This mass was characterized as multiloculated and  benign appearing on 12/20/2009 MRI abdomen. Electronically Signed   By: Ilona Sorrel M.D.   On: 04/27/2017 15:41     Medications:   . heparin 1,700 Units/hr (04/28/17 1340)  . piperacillin-tazobactam (ZOSYN)  IV Stopped (04/28/17 1250)  . vancomycin Stopped (04/28/17 0147)   . losartan  100 mg Oral Daily  . mycophenolate  1,000 mg Oral BID  . prednisoLONE  10 mg Oral Daily  . rosuvastatin  10 mg Oral QHS   diphenhydrAMINE, docusate sodium, ipratropium-albuterol, morphine injection  Assessment/ Plan:  56 y.o.  male with past history of pulmonary embolus, history of IVC filter placement, right ankle fracture status post ORIF, and herniated disc, lupus nephritis, proteinuria, CKD 3, hypertension  1.  Acute renal failure on chronic kidney disease stage III 2.  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  Relapse February 2018 3.  Right axillary abscess 4.  Hematuria and proteinuria  Acute worsening of renal failure appears to be secondary to concurrent abscess/infection in the right axillary area.  Patient is being treated with broad-spectrum antibiotics.  Surgery team is following along. Urine protein to creatinine ratio 0.73.  Complement levels have been ordered for tomorrow morning to assess lupus activity Patient has had long-standing hematuria. He is currently on anticoagulation which may be playing a role Will follow closely    LOS: 1 Lucion Dilger 5/8/20182:58 PM  Central [facility] of [facility] [place], [place]

## 2017-04-28 NOTE — Progress Notes (Signed)
CC: Axillary abscess Subjective: Surgery consult for an axillary abscess. The area continues to spontaneously drain. Patient reports that he is having to clean the area frequently. No dressing currently in place.  Objective: Vital signs in last 24 hours: Temp:  [97.8 F (36.6 C)-98.8 F (37.1 C)] 97.8 F (36.6 C) (05/07 2145) Pulse Rate:  [82-106] 88 (05/08 0512) Resp:  [16-22] 16 (05/08 0512) BP: (107-158)/(59-98) 141/80 (05/08 0512) SpO2:  [98 %-100 %] 100 % (05/08 0512)    Intake/Output from previous day: 05/07 0701 - 05/08 0700 In: 1626.9 [I.V.:126.9; IV Piggyback:1500] Out: 200 [Urine:200] Intake/Output this shift: Total I/O In: 396 [P.O.:240; I.V.:156] Out: 500 [Urine:500]  Physical exam:  Gen.: No acute distress Skin: Right axillary examined. Spontaneously draining purulent fluid. Minimal induration. No current erythema.  Lab Results: CBC   Recent Labs  04/27/17 1055 04/28/17 0406  WBC 15.7* 10.8*  HGB 11.4* 9.8*  HCT 34.3* 28.1*  PLT 230 213   BMET  Recent Labs  04/27/17 1055 04/28/17 0406  NA 131* 134*  K 3.9 4.0  CL 97* 102  CO2 19* 23  GLUCOSE 125* 111*  BUN 46* 51*  CREATININE 3.71* 3.76*  CALCIUM 8.7* 7.9*   PT/INR  Recent Labs  04/27/17 1559  LABPROT 19.7*  INR 1.65   ABG No results for input(s): PHART, HCO3 in the last 72 hours.  Invalid input(s): PCO2, PO2  Studies/Results: Ct Chest Wo Contrast  Result Date: 04/27/2017 CLINICAL DATA:  Worsening dyspnea. 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 EXAM: CT CHEST WITHOUT CONTRAST TECHNIQUE: Multidetector CT imaging of the chest was performed following the standard protocol without IV contrast. COMPARISON:  10/01/2015 chest CT angiogram. FINDINGS: Cardiovascular: Normal heart size. No significant pericardial fluid/thickening. Left anterior descending, left circumflex and right coronary atherosclerosis. Great vessels are normal in course and caliber. Mediastinum/Nodes: No discrete  thyroid nodules. Unremarkable esophagus. There is focal skin thickening in the right axilla. There are multiple newly enlarged lymph nodes in the right axilla, largest measuring 2.1 cm short axis (series 2/ image 58). There is associated extensive fat stranding throughout the right axilla. An irregular 4.3 x 2.4 cm fluid density collection in the deep subcutaneous right axillary region (series 2/image 50) is new. No left axillary adenopathy. No pathologically enlarged mediastinal or gross hilar nodes on this noncontrast scan. Lungs/Pleura: No pneumothorax. No pleural effusion. No acute consolidative airspace disease, lung masses or significant pulmonary nodules. Stable parenchymal bands in the peripheral anterior right lower lobe compatible with postinfectious/postinflammatory scarring. Upper abdomen: Lobulated 8.8 x 8.0 cm cystic lesion in the posterior right liver lobe (series 2/ image 130) with clustered eccentric coarse mural calcifications, which measured 8.5 x 7.6 cm on 07/20/2013 and 7.9 x 6.7 cm on 10/01/2015, mildly increased in size. Musculoskeletal: No aggressive appearing focal osseous lesions. Moderate thoracic spondylosis. IMPRESSION: 1. New right axillary adenopathy with surrounding fat stranding and overlying right axillary skin thickening, and with an irregular 4.3 x 2.4 cm subcutaneous right axillary fluid collection, which could represent an abscess (not well evaluated on this noncontrast scan). Surgical consultation should be considered. 2. No acute pulmonary disease. 3. Three-vessel coronary atherosclerosis. 4. 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 This mass was characterized as multiloculated and benign appearing on 12/20/2009 MRI abdomen. Electronically Signed   By: Ilona Sorrel M.D.   On: 04/27/2017 15:41  Anti-infectives: Anti-infectives    Start     Dose/Rate Route Frequency Ordered Stop   04/28/17 0000  vancomycin (VANCOCIN) IVPB 1000 mg/200 mL premix     1,000 mg 200 mL/hr over 60 Minutes Intravenous Every 24 hours 04/27/17 1508     04/27/17 2200  piperacillin-tazobactam (ZOSYN) IVPB 3.375 g     3.375 g 12.5 mL/hr over 240 Minutes Intravenous Every 8 hours 04/27/17 1508     04/27/17 1515  piperacillin-tazobactam (ZOSYN) IVPB 3.375 g     3.375 g 100 mL/hr over 30 Minutes Intravenous  Once 04/27/17 1503 04/27/17 1730   04/27/17 1515  vancomycin (VANCOCIN) IVPB 1000 mg/200 mL premix     1,000 mg 200 mL/hr over 60 Minutes Intravenous  Once 04/27/17 1503 04/27/17 1834      Assessment/Plan:  56 year old male with multiple medical problems and a spontaneously draining axillary abscess. Discussed with the patient that it may require surgical drainage to completely eliminate the infection. However it appears to be improving. Discussed we would continue to follow and make decisions in the next 24 hours of her whether or not it needs operative intervention. Patient voiced understanding. Plan to discuss with nursing staff applying a dressing to the area so it would not saturate his clothing or linens.  Amorette Charrette T. Adonis Huguenin, MD, [facility] General Surgeon [facility]  [facility] [phone] Night [facility] [phone] 04/28/2017

## 2017-04-28 NOTE — Progress Notes (Signed)
[place] for heparin dosing Indication: pulmonary embolus       Allergies  Allergen Reactions  . Vicodin [Hydrocodone-Acetaminophen] Hives and Other (See Comments)    Rash/hives Other Reaction: severe headaches Rash/hives Other Reaction: severe headaches Rash/hives  . Hydrocodone Rash  . Oxycodone Rash    Takes occasionally.    Patient Measurements: Height: 6\' 2"  (188 cm) Weight: 236 lb (107 kg) IBW/kg (Calculated) : 82.2 Heparin Dosing Weight: 104 kg  Vital Signs: Temp: 97.8 F (36.6 C) (05/07 2145) Temp Source: Oral (05/07 2145) BP: 107/59 (05/07 2145) Pulse Rate: 106 (05/07 2145)  Labs:  Recent Labs (last 2 labs)    Recent Labs  04/27/17 1055 04/27/17 1559  HGB 11.4*  --   HCT 34.3*  --   PLT 230  --   LABPROT  --  19.7*  INR  --  1.65  CREATININE 3.71*  --   TROPONINI  --  0.03*      Estimated Creatinine Clearance: 29.3 mL/min (A) (by C-G formula based on SCr of 3.71 mg/dL (H)).   Medical History:     Past Medical History:  Diagnosis Date  . DVT (deep venous thrombosis) ([place])   . Hyperlipidemia   . Hypertension   . Lupus   . Pulmonary embolism ([place])   . Renal disorder     Medications:  Scheduled:  . ipratropium-albuterol  3 mL Inhalation Q6H  . losartan  100 mg Oral Daily  . mycophenolate  1,000 mg Oral BID  . [START ON 04/28/2017] prednisoLONE  10 mg Oral Daily  . rosuvastatin  10 mg Oral QHS    Assessment: Patient admitted for R axillary abscess w/ h/o DVT and PE anticoagulated w/ apixaban. Patient's last eliquis dose on 5/7 @ 0830. Pt. Is being started on heparin drip  5/8 10:00 aPTT 114, Heparin level 1.36 Currently infusing at 1800 units/hr  Goal of Therapy:  Heparin level 0.3-0.7 units/ml, aPTT 66 - 109 sec Monitor platelets by anticoagulation protocol: Yes   Plan:  Heparin level still elevated from apixaban. Will continue to use aPTT to guide therarpy.  APTT  slightly above goal, will decrease to Heparin 1700 units/hr. Will check aPTT only in 6hr. Daily CBC and Heparin level with AM labs.  5/8 1755 aPTT therapeutic x 1. Continue current rate. Will recheck aPTT in 6 hours.  Lashala Laser A. Jordan Hawks, PharmD, BCPS Clinical Pharmacist 04/28/2017 6:47 PM

## 2017-04-28 NOTE — Progress Notes (Addendum)
[place] for heparin dosing Indication: pulmonary embolus       Allergies  Allergen Reactions  . Vicodin [Hydrocodone-Acetaminophen] Hives and Other (See Comments)    Rash/hives Other Reaction: severe headaches Rash/hives Other Reaction: severe headaches Rash/hives  . Hydrocodone Rash  . Oxycodone Rash    Takes occasionally.    Patient Measurements: Height: 6\' 2"  (188 cm) Weight: 236 lb (107 kg) IBW/kg (Calculated) : 82.2 Heparin Dosing Weight: 104 kg  Vital Signs: Temp: 97.8 F (36.6 C) (05/07 2145) Temp Source: Oral (05/07 2145) BP: 107/59 (05/07 2145) Pulse Rate: 106 (05/07 2145)  Labs:  Recent Labs (last 2 labs)    Recent Labs  04/27/17 1055 04/27/17 1559  HGB 11.4*  --   HCT 34.3*  --   PLT 230  --   LABPROT  --  19.7*  INR  --  1.65  CREATININE 3.71*  --   TROPONINI  --  0.03*      Estimated Creatinine Clearance: 29.3 mL/min (A) (by C-G formula based on SCr of 3.71 mg/dL (H)).   Medical History:     Past Medical History:  Diagnosis Date  . DVT (deep venous thrombosis) ([place])   . Hyperlipidemia   . Hypertension   . Lupus   . Pulmonary embolism ([place])   . Renal disorder     Medications:  Scheduled:  . ipratropium-albuterol  3 mL Inhalation Q6H  . losartan  100 mg Oral Daily  . mycophenolate  1,000 mg Oral BID  . [START ON 04/28/2017] prednisoLONE  10 mg Oral Daily  . rosuvastatin  10 mg Oral QHS    Assessment: Patient admitted for R axillary abscess w/ h/o DVT and PE anticoagulated w/ apixaban. Patient's last eliquis dose on 5/7 @ 0830. Pt. Is being started on heparin drip  5/8 10:00 aPTT 114, Heparin level 1.36 Currently infusing at 1800 units/hr  Goal of Therapy:  Heparin level 0.3-0.7 units/ml, aPTT 66 - 109 sec Monitor platelets by anticoagulation protocol: Yes   Plan:  Heparin level still elevated from apixaban. Will continue to use aPTT to guide therarpy.  APTT  slightly above goal, will decrease to Heparin 1700 units/hr. Will check aPTT only in 6hr. Daily CBC and Heparin level with AM labs.  Paulina Fusi, PharmD, BCPS 04/28/2017 11:30 AM

## 2017-04-28 NOTE — Progress Notes (Signed)
[facility] [place] NAME: Jonathon Snow    MR#:  [id number]  DATE OF BIRTH:  [birth year]  SUBJECTIVE:  CHIEF COMPLAINT:  Shortness of breath is slightly better than yesterday  REVIEW OF SYSTEMS:  CONSTITUTIONAL: No fever, fatigue or weakness.  EYES: No blurred or double vision.  EARS, NOSE, AND THROAT: No tinnitus or ear pain.  RESPIRATORY: No cough,Slightly improved shortness of breath, no wheezing or hemoptysis.  CARDIOVASCULAR: No chest pain, orthopnea, edema.  GASTROINTESTINAL: No nausea, vomiting, diarrhea or abdominal pain.  GENITOURINARY: No dysuria, hematuria.  ENDOCRINE: No polyuria, nocturia,  HEMATOLOGY: No anemia, easy bruising or bleeding SKIN: No rash or lesion. MUSCULOSKELETAL: No joint pain or arthritis.   NEUROLOGIC: No tingling, numbness, weakness.  PSYCHIATRY: No anxiety or depression.   DRUG ALLERGIES:   Allergies  Allergen Reactions  . Vicodin [Hydrocodone-Acetaminophen] Hives and Other (See Comments)    Rash/hives Other Reaction: severe headaches Rash/hives Other Reaction: severe headaches Rash/hives  . Hydrocodone Rash  . Oxycodone Rash    Takes occasionally.    VITALS:  Blood pressure 138/71, pulse 94, temperature 98.2 F (36.8 C), temperature source Oral, resp. rate 20, height 6\' 2"  (1.88 m), weight 107 kg (236 lb), SpO2 95 %.  PHYSICAL EXAMINATION:  GENERAL:  56 y.o.-year-old patient lying in the bed with no acute distress.  EYES: Pupils equal, round, reactive to light and accommodation. No scleral icterus. Extraocular muscles intact.  HEENT: Head atraumatic, normocephalic. Oropharynx and nasopharynx clear.  NECK:  Supple, no jugular venous distention. No thyroid enlargement, no tenderness.  LUNGS: Normal breath sounds bilaterally, no wheezing, rales,rhonchi or crepitation. No use of accessory muscles of respiration.  CARDIOVASCULAR: S1, S2 normal. No murmurs, rubs, or gallops.  ABDOMEN:  Soft, nontender, nondistended. Bowel sounds present. No organomegaly or mass.  EXTREMITIES: No pedal edema, cyanosis, or clubbing.  NEUROLOGIC: Cranial nerves II through XII are intact. Muscle strength 5/5 in all extremities. Sensation intact. Gait not checked.  PSYCHIATRIC: The patient is alert and oriented x 3.  SKIN: No obvious rash, lesion, or ulcer.    LABORATORY PANEL:   CBC  Recent Labs Lab 04/28/17 0406  WBC 10.8*  HGB 9.8*  HCT 28.1*  PLT 213   ------------------------------------------------------------------------------------------------------------------  Chemistries   Recent Labs Lab 04/27/17 1559 04/28/17 0406  NA  --  134*  K  --  4.0  CL  --  102  CO2  --  23  GLUCOSE  --  111*  BUN  --  51*  CREATININE  --  3.76*  CALCIUM  --  7.9*  AST 18  --   ALT 22  --   ALKPHOS 57  --   BILITOT 1.0  --    ------------------------------------------------------------------------------------------------------------------  Cardiac Enzymes  Recent Labs Lab 04/27/17 1559  TROPONINI 0.03*   ------------------------------------------------------------------------------------------------------------------  RADIOLOGY:  Ct Chest Wo Contrast  Result Date: 04/27/2017 CLINICAL DATA:  Worsening dyspnea. 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 EXAM: CT CHEST WITHOUT CONTRAST TECHNIQUE: Multidetector CT imaging of the chest was performed following the standard protocol without IV contrast. COMPARISON:  10/01/2015 chest CT angiogram. FINDINGS: Cardiovascular: Normal heart size. No significant pericardial fluid/thickening. Left anterior descending, left circumflex and right coronary atherosclerosis. Great vessels are normal in course and caliber. Mediastinum/Nodes: No discrete thyroid nodules. Unremarkable esophagus. There is focal skin thickening in the right axilla. There are multiple newly enlarged lymph nodes in the right axilla, largest measuring 2.1 cm short axis (series  2/ image 58). There is associated extensive fat stranding throughout the right axilla. An irregular 4.3 x 2.4 cm fluid density collection in the deep subcutaneous right axillary region (series 2/image 50) is new. No left axillary adenopathy. No pathologically enlarged mediastinal or gross hilar nodes on this noncontrast scan. Lungs/Pleura: No pneumothorax. No pleural effusion. No acute consolidative airspace disease, lung masses or significant pulmonary nodules. Stable parenchymal bands in the peripheral anterior right lower lobe compatible with postinfectious/postinflammatory scarring. Upper abdomen: Lobulated 8.8 x 8.0 cm cystic lesion in the posterior right liver lobe (series 2/ image 130) with clustered eccentric coarse mural calcifications, which measured 8.5 x 7.6 cm on 07/20/2013 and 7.9 x 6.7 cm on 10/01/2015, mildly increased in size. Musculoskeletal: No aggressive appearing focal osseous lesions. Moderate thoracic spondylosis. IMPRESSION: 1. New right axillary adenopathy with surrounding fat stranding and overlying right axillary skin thickening, and with an irregular 4.3 x 2.4 cm subcutaneous right axillary fluid collection, which could represent an abscess (not well evaluated on this noncontrast scan). Surgical consultation should be considered. 2. No acute pulmonary disease. 3. Three-vessel coronary atherosclerosis. 4. 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 This mass was characterized as multiloculated and benign appearing on 12/20/2009 MRI abdomen. Electronically Signed   By: Ilona Sorrel M.D.   On: 04/27/2017 15:41    EKG:   Orders placed or performed during the hospital encounter of 04/27/17  . ED EKG  . ED EKG  . EKG 12-Lead  . EKG 12-Lead    ASSESSMENT AND PLAN:   * Acute on chronic renal failure 3 . History of lupus nephritis    worsening of renal function could be from right axillary abscess. On IV antibiotics   Currently he is on steroids 10 mg oral prednisone daily and CellCept, continue the same    follow-up with nephrology. Complement levels were ordered  *Chronic hematuria- on anticoagulation heparin gtt currently, holding Eliquis   * Right axillary abscess Status post spontaneous drainage and no definitive evidence of undrained collection.. Surgery will follow Continue IV antibiotics Zosyn and vancomycin.  * Lupus   .Currently he is on steroids 10 mg oral prednisone daily and CellCept, continue the same.  * History of DVT and PE   Continue anticoagulation with heparin drip holding Eliquis  * Hypertension   Continue losartan.     All the records are reviewed and case discussed with Care Management/Social Workerr. Management plans discussed with the patient, family and they are in agreement.  CODE STATUS: fc   TOTAL TIME TAKING CARE OF THIS PATIENT: 35  minutes.   POSSIBLE D/C IN 2-3  DAYS, DEPENDING ON CLINICAL CONDITION.  Note: This dictation was prepared with Dragon dictation along with smaller phrase technology. Any transcriptional errors that result from this process are unintentional.   [name] M.D on 04/28/2017 at 4:31 PM  Between 7am to 6pm - Pager - [phone] After 6pm go to [email or web address] - password [email or web address] [facility]  [facility]  Office  [phone]  CC: Primary care physician; Roselee Nova, MD

## 2017-04-29 ENCOUNTER — Inpatient Hospital Stay: Payer: 59 | Admitting: Anesthesiology

## 2017-04-29 ENCOUNTER — Encounter: Payer: Self-pay | Admitting: *Deleted

## 2017-04-29 ENCOUNTER — Encounter
Admission: EM | Disposition: A | Discharge status: Home Health | Payer: Self-pay | Source: Home / Self Care | Attending: Internal Medicine

## 2017-04-29 ENCOUNTER — Inpatient Hospital Stay: Payer: 59

## 2017-04-29 ENCOUNTER — Inpatient Hospital Stay: Payer: 59 | Admitting: Internal Medicine

## 2017-04-29 DIAGNOSIS — N492 Inflammatory disorders of scrotum: Secondary | ICD-10-CM

## 2017-04-29 HISTORY — PX: IRRIGATION AND DEBRIDEMENT ABSCESS: SHX5252

## 2017-04-29 HISTORY — PX: I & D EXTREMITY: SHX5045

## 2017-04-29 LAB — BASIC METABOLIC PANEL
Anion gap: 12 (ref 5–15)
BUN: 59 mg/dL — AB (ref 6–20)
CO2: 23 mmol/L (ref 22–32)
CREATININE: 3.53 mg/dL — AB (ref 0.61–1.24)
Calcium: 8.1 mg/dL — ABNORMAL LOW (ref 8.9–10.3)
Chloride: 100 mmol/L — ABNORMAL LOW (ref 101–111)
GFR, EST AFRICAN AMERICAN: 21 mL/min — AB (ref >60)
GFR, EST NON AFRICAN AMERICAN: 18 mL/min — AB (ref >60)
Glucose, Bld: 128 mg/dL — ABNORMAL HIGH (ref 65–99)
Potassium: 3.8 mmol/L (ref 3.5–5.1)
SODIUM: 135 mmol/L (ref 135–145)

## 2017-04-29 LAB — URINE CULTURE: CULTURE: NO GROWTH

## 2017-04-29 LAB — CULTURE, BLOOD (ROUTINE X 2): SPECIAL REQUESTS: ADEQUATE

## 2017-04-29 LAB — CBC
HCT: 27.5 % — ABNORMAL LOW (ref 40.0–52.0)
Hemoglobin: 9.5 g/dL — ABNORMAL LOW (ref 13.0–18.0)
MCH: 30.8 pg (ref 26.0–34.0)
MCHC: 34.4 g/dL (ref 32.0–36.0)
MCV: 89.6 fL (ref 80.0–100.0)
PLATELETS: 213 10*3/uL (ref 150–440)
RBC: 3.08 MIL/uL — AB (ref 4.40–5.90)
RDW: 13.4 % (ref 11.5–14.5)
WBC: 9.9 10*3/uL (ref 3.8–10.6)

## 2017-04-29 LAB — MRSA PCR SCREENING: MRSA by PCR: POSITIVE — AB

## 2017-04-29 LAB — HEPARIN LEVEL (UNFRACTIONATED)
Heparin Unfractionated: 0.25 IU/mL — ABNORMAL LOW (ref 0.30–0.70)
Heparin Unfractionated: 0.79 IU/mL — ABNORMAL HIGH (ref 0.30–0.70)
Heparin Unfractionated: 0.88 IU/mL — ABNORMAL HIGH (ref 0.30–0.70)

## 2017-04-29 LAB — APTT
APTT: 41 s — AB (ref 24–36)
APTT: 126 s — AB (ref 24–36)
aPTT: 92 seconds — ABNORMAL HIGH (ref 24–36)

## 2017-04-29 SURGERY — IRRIGATION AND DEBRIDEMENT EXTREMITY
Anesthesia: General | Laterality: Right | Wound class: Dirty or Infected

## 2017-04-29 MED ORDER — PROPOFOL 10 MG/ML IV BOLUS
INTRAVENOUS | Status: AC
  Filled 2017-04-29: qty 20

## 2017-04-29 MED ORDER — FENTANYL CITRATE (PF) 100 MCG/2ML IJ SOLN
INTRAMUSCULAR | Status: DC | PRN
  Administered 2017-04-29 (×3): 25 ug via INTRAVENOUS

## 2017-04-29 MED ORDER — DEXMEDETOMIDINE HCL 200 MCG/2ML IV SOLN
INTRAVENOUS | Status: DC | PRN
  Administered 2017-04-29: 8 ug via INTRAVENOUS

## 2017-04-29 MED ORDER — APIXABAN 5 MG PO TABS: 5.0000 mg | ORAL_TABLET | Freq: Two times a day (BID) | ORAL | Status: DC

## 2017-04-29 MED ORDER — ONDANSETRON HCL 4 MG/2ML IJ SOLN
INTRAMUSCULAR | Status: AC
  Filled 2017-04-29: qty 2

## 2017-04-29 MED ORDER — FENTANYL CITRATE (PF) 250 MCG/5ML IJ SOLN
INTRAMUSCULAR | Status: AC
  Filled 2017-04-29: qty 5

## 2017-04-29 MED ORDER — LIDOCAINE 2% (20 MG/ML) 5 ML SYRINGE
INTRAMUSCULAR | Status: DC | PRN
  Administered 2017-04-29: 100 mg via INTRAVENOUS

## 2017-04-29 MED ORDER — DEXAMETHASONE SODIUM PHOSPHATE 10 MG/ML IJ SOLN: INTRAMUSCULAR | Status: DC | PRN

## 2017-04-29 MED ORDER — GLYCOPYRROLATE 0.2 MG/ML IJ SOLN
INTRAMUSCULAR | Status: DC | PRN
  Administered 2017-04-29: 0.2 mg via INTRAVENOUS

## 2017-04-29 MED ORDER — PHENYLEPHRINE 40 MCG/ML (10ML) SYRINGE FOR IV PUSH (FOR BLOOD PRESSURE SUPPORT)
PREFILLED_SYRINGE | INTRAVENOUS | Status: DC | PRN
  Administered 2017-04-29 (×2): 100 ug via INTRAVENOUS

## 2017-04-29 MED ORDER — MUPIROCIN 2 % EX OINT
1.0000 "application " | TOPICAL_OINTMENT | Freq: Two times a day (BID) | CUTANEOUS | Status: DC
  Administered 2017-04-29 – 2017-05-03 (×8): 1 via NASAL
  Filled 2017-04-29 (×2): qty 22

## 2017-04-29 MED ORDER — PROPOFOL 10 MG/ML IV BOLUS
INTRAVENOUS | Status: DC | PRN
  Administered 2017-04-29: 200 mg via INTRAVENOUS

## 2017-04-29 MED ORDER — MIDAZOLAM HCL 2 MG/2ML IJ SOLN
INTRAMUSCULAR | Status: AC
  Filled 2017-04-29: qty 2

## 2017-04-29 MED ORDER — ONDANSETRON HCL 4 MG/2ML IJ SOLN: 4.0000 mg | Freq: Once | INTRAMUSCULAR | Status: DC | PRN

## 2017-04-29 MED ORDER — BUPIVACAINE HCL (PF) 0.5 % IJ SOLN
INTRAMUSCULAR | Status: AC
  Filled 2017-04-29: qty 30

## 2017-04-29 MED ORDER — FENTANYL CITRATE (PF) 100 MCG/2ML IJ SOLN: 25.0000 ug | INTRAMUSCULAR | Status: DC | PRN

## 2017-04-29 MED ORDER — ONDANSETRON HCL 4 MG/2ML IJ SOLN
INTRAMUSCULAR | Status: DC | PRN
  Administered 2017-04-29: 4 mg via INTRAVENOUS

## 2017-04-29 MED ORDER — DEXAMETHASONE SODIUM PHOSPHATE 10 MG/ML IJ SOLN
INTRAMUSCULAR | Status: AC
  Filled 2017-04-29: qty 1

## 2017-04-29 MED ORDER — APIXABAN 5 MG PO TABS
5.0000 mg | ORAL_TABLET | Freq: Two times a day (BID) | ORAL | Status: DC
  Administered 2017-04-30 – 2017-05-03 (×7): 5 mg via ORAL
  Filled 2017-04-29 (×7): qty 1

## 2017-04-29 MED ORDER — CHLORHEXIDINE GLUCONATE CLOTH 2 % EX PADS
6.0000 | MEDICATED_PAD | Freq: Every day | CUTANEOUS | Status: DC
Start: 2017-04-30 — End: 2017-05-03
  Administered 2017-05-01 – 2017-05-03 (×2): 6 via TOPICAL

## 2017-04-29 MED ORDER — LIDOCAINE HCL (PF) 1 % IJ SOLN
INTRAMUSCULAR | Status: AC
  Filled 2017-04-29: qty 30

## 2017-04-29 MED ORDER — SODIUM CHLORIDE 0.9 % IV SOLN
INTRAVENOUS | Status: DC
  Administered 2017-04-29 (×2): via INTRAVENOUS

## 2017-04-29 SURGICAL SUPPLY — 14 items
BLADE SURG SZ11 CARB STEEL (BLADE) ×3 IMPLANT
CANISTER SUCT 1200ML W/VALVE (MISCELLANEOUS) ×3 IMPLANT
DRAIN PENROSE 1/4X12 LTX (DRAIN) ×6 IMPLANT
ELECT REM PT RETURN 9FT ADLT (ELECTROSURGICAL) ×3
ELECTRODE REM PT RTRN 9FT ADLT (ELECTROSURGICAL) ×2 IMPLANT
GAUZE IODOFORM PACK 1/2 7832 (GAUZE/BANDAGES/DRESSINGS) ×3 IMPLANT
GLOVE BIO SURGEON STRL SZ7.5 (GLOVE) ×3 IMPLANT
GLOVE INDICATOR 8.0 STRL GRN (GLOVE) ×3 IMPLANT
GOWN STRL REUS W/ TWL LRG LVL3 (GOWN DISPOSABLE) ×4 IMPLANT
GOWN STRL REUS W/TWL LRG LVL3 (GOWN DISPOSABLE) ×2
KIT RM TURNOVER STRD PROC AR (KITS) ×3 IMPLANT
NDL SAFETY 18GX1.5 (NEEDLE) ×3 IMPLANT
SWAB CULTURE AMIES ANAERIB BLU (MISCELLANEOUS) ×6 IMPLANT
SYRINGE 10CC LL (SYRINGE) ×3 IMPLANT

## 2017-04-29 NOTE — Transfer of Care (Signed)
Immediate Anesthesia Transfer of Care Note  Patient: Jonathon Snow  Procedure(s) Performed: Procedure(s): IRRIGATION AND DEBRIDEMENT EXTREMITY (Right) IRRIGATION AND DEBRIDEMENT Scrotal ABSCESS (Left)  Patient Location: PACU  Anesthesia Type:General  Level of Consciousness: awake, alert  and oriented  Airway & Oxygen Therapy: Patient Spontanous Breathing  Post-op Assessment: Report given to RN and Post -op Vital signs reviewed and stable  Post vital signs: Reviewed and stable  Last Vitals:  Vitals:   04/29/17 1123 04/29/17 1146  BP: 135/77 (!) 141/84  Pulse: 82 79  Resp:  20  Temp:  36.2 C    Last Pain:  Vitals:   04/29/17 1146  TempSrc: Tympanic  PainSc: 7       Patients Stated Pain Goal: 2 (41/96/22 2979)  Complications: No apparent anesthesia complications

## 2017-04-29 NOTE — Progress Notes (Addendum)
Per MD, can pause heparin gtt for surgery. Will resume it after surgery. Will keep heparin gtt running until pt is down for surgery.

## 2017-04-29 NOTE — Op Note (Signed)
   Pre-operative Diagnosis: Right axillary abscess  Post-operative Diagnosis: Same  Procedure performed: Incision and drainage of right axillary abscess  Surgeon: Clayburn Pert   Assistants: None  Anesthesia: General endotracheal anesthesia  ASA Class: 3  Surgeon: Clayburn Pert, MD FACS  Anesthesia: Gen. with endotracheal tube  Assistant: None  Procedure Details  The patient was seen again in the Holding Room. The benefits, complications, treatment options, and expected outcomes were discussed with the patient. The risks of bleeding, infection, recurrence of symptoms, failure to resolve symptoms,  nerve injury, any of which could require further surgery were reviewed with the patient.   The patient was taken to Operating Room, identified as RAMAL ECKHARDT and the procedure verified.  A Time Out was held and the above information confirmed.  Prior to the induction of general anesthesia, antibiotic prophylaxis was confirmed. VTE prophylaxis was in place. General endotracheal anesthesia was then administered and tolerated well. After the induction, the right axilla was prepped with Betadine and draped in the sterile fashion. The patient was positioned in the supine position.  This procedure was performed in conjunction with a left scrotal abscess drainage by urology, Dr. Erlene Quan. They'll be dictated in its own separate operative report.  Procedure performed began with culture of the spontaneously draining abscess with cultures also. The spontaneous opening was then enlarged with a 10 blade scalpel. It was probed with a Kelly forceps and found to be approximately 4 cm in diameter in the caudad direction. A counterincision was made with a 10 blade scalpel and the area was able to be contacted. The abscess was then copiously irrigated with normal saline and a Penrose drain was placed through the abscess. The drain was secured with a 3-0 nylon suture on both ends. The area was cleaned with a  wet and dry scrubbed out. A sterile dressing of plain gauze may be patent was placed over this and secured with tape.  Patient type procedure well. All counts correct at the end of the procedure. He was awoken from general anesthesia and transferred to the PACU in good condition. There were no immediate complications.  Findings: Large right axillary abscess   Estimated Blood Loss: 10 mL         Drains: Penrose drain within the abscess cavity         Specimens: Culture of abscess fluid          Complications: None                  Condition: Good   Clayburn Pert, MD, FACS

## 2017-04-29 NOTE — Brief Op Note (Signed)
04/27/2017 - 04/29/2017  2:16 PM  PATIENT:  Jonathon Snow  56 y.o. male  PRE-OPERATIVE DIAGNOSIS:  right axillary abscess  POST-OPERATIVE DIAGNOSIS:  right axillary abscess Left Scrotal Abscess  PROCEDURE:  Procedure(s): IRRIGATION AND DEBRIDEMENT EXTREMITY (Right) IRRIGATION AND DEBRIDEMENT Scrotal ABSCESS (Left)  SURGEON:  Surgeon(s) and Role:    Clayburn Pert, MD - Primary    * Hollice Espy, MD - Assisting  PHYSICIAN ASSISTANT:   ASSISTANTS: none   ANESTHESIA:   general  EBL:  Total I/O In: 347.9 [I.V.:347.9] Out: 200 [Urine:200]  BLOOD ADMINISTERED:none  DRAINS: Penrose drain in the right axilla    LOCAL MEDICATIONS USED:  NONE  SPECIMEN:  Source of Specimen:  cultures of both abscesses  DISPOSITION OF SPECIMEN:  PATHOLOGY  COUNTS:  YES  TOURNIQUET:  * No tourniquets in log *  DICTATION: .Dragon Dictation  PLAN OF CARE: return to inpatient status  PATIENT DISPOSITION:  PACU - hemodynamically stable.   Delay start of Pharmacological VTE agent (>24hrs) due to surgical blood loss or risk of bleeding: no

## 2017-04-29 NOTE — Op Note (Signed)
Date of procedure: 04/29/17  Preoperative diagnosis:  1. Scrotal abscess   Postoperative diagnosis:  1. Scrotal abscess   Procedure: 1. Incision and drainage of scrotal abscess  Surgeon: Hollice Espy, MD  Anesthesia: General  Complications: None  Intraoperative findings: Small approximately 2 cm x 2 cm scrotal abscess on the left hemiscrotum with purulent material drained.  EBL: Minimal  Specimens: Scrotal culture  Drains: None  Indication: Jonathon Snow is a 56 y.o. patient with right axillary abscess in the left hemiscrotal abscess.  After reviewing the management options for treatment, he elected to proceed with the above surgical procedure(s). We have discussed the potential benefits and risks of the procedure, side effects of the proposed treatment, the likelihood of the patient achieving the goals of the procedure, and any potential problems that might occur during the procedure or recuperation. Informed consent has been obtained.  Description of procedure:  The patient was taken to the operating room and general anesthesia was induced.  The patient was placed in the supine position and prepped and draped in standard surgical fashion.  Periprocedural antibiotics were given.  Using 11 blade scalpel, an approximately 1.5 x 1.5 cruciate incision was made overlying the left scrotal abscess. A small pocket of purulent material was drained, proximally 5 cc. Culture swab was used to culture the material. The wound was then copiously irrigated. It did not appear to track significantly and was noted within the dartos. Half inch packing tape was then used to pack the wound. A dressing of an AVD pad and mesh panties were applied.  Please see Dr. Lorrene Reid note for the remainder of the procedure.   Hollice Espy, M.D.

## 2017-04-29 NOTE — H&P (Signed)
Urology Consult  I have been asked to see the patient by Dr. Margaretmary Eddy, for evaluation and management of scrotal abscess.  Chief Complaint: scrotal drainage  History of Present Illness: Jonathon Snow is a 56 y.o. year old with a history of CAD, lupus, DVT and PE admitted with acute on chronic renal failure, a right axillary abscess and a newly identified left scrotal abscess today. He doesn't currently in the preoperative holding area waiting I&D of his axillary abscess.  This morning, he developed drainage from his left hemiscrotum with some slight associated discomfort.  He denies any previous history of scrotal abscesses.  He denies any urinary symptoms.  He is currently on a heparin drip which is being held for his I&D.  Past Medical History:  Diagnosis Date  . DVT (deep venous thrombosis) ([place])   . Hyperlipidemia   . Hypertension   . Lupus   . Pulmonary embolism ([place])   . Renal disorder     Past Surgical History:  Procedure Laterality Date  . ANKLE SURGERY Right   . CYST EXCISION  92 or 93    Liver cyst removal [facility]    Home Medications:  Current Meds  Medication Sig  . COMBIVENT RESPIMAT 20-100 MCG/ACT AERS respimat Inhale 1 puff into the lungs daily as needed.  [name] ELIQUIS 5 MG TABS tablet TAKE 1 TABLET BY MOUTH 2 TIMES DAILY  . losartan (COZAAR) 100 MG tablet Take 100 mg by mouth daily.  . Multiple Vitamins-Minerals (MULTIVITAMIN WITH MINERALS) tablet Take 1 tablet by mouth daily.  . mycophenolate (CELLCEPT) 500 MG tablet Take 1,000 mg by mouth 2 (two) times daily.  . prednisoLONE 5 MG TABS tablet Take 10 mg by mouth daily.  . rosuvastatin (CRESTOR) 10 MG tablet Take 1 tablet (10 mg total) by mouth at bedtime.  . [DISCONTINUED] predniSONE (DELTASONE) 10 MG tablet Take 10 mg by mouth daily.     Allergies:  Allergies  Allergen Reactions  . Vicodin [Hydrocodone-Acetaminophen] Hives and Other (See Comments)    Rash/hives Other Reaction: severe  headaches Rash/hives Other Reaction: severe headaches Rash/hives  . Hydrocodone Rash  . Oxycodone Rash    Takes occasionally.    Family History  Problem Relation Age of Onset  . Hypertension Father     Social History:  reports that he has never smoked. He has never used smokeless tobacco. He reports that he does not drink alcohol. His drug history is not on file.  ROS: A complete review of systems was performed.  All systems are negative except for pertinent findings as noted.  Physical Exam:  Vital signs in last 24 hours: Temp:  [97.2 F (36.2 C)-98.2 F (36.8 C)] 97.2 F (36.2 C) (05/09 1146) Pulse Rate:  [74-94] 79 (05/09 1146) Resp:  [19-20] 20 (05/09 1146) BP: (116-141)/(68-84) 141/84 (05/09 1146) SpO2:  [95 %-99 %] 98 % (05/09 1146) Weight:  [236 lb (107 kg)] 236 lb (107 kg) (05/09 1146) Constitutional:  Alert and oriented, No acute distress HEENT: [place] AT, moist mucus membranes.  Trachea midline, no masses Cardiovascular: Regular rate and rhythm, no clubbing, cyanosis, or edema. Respiratory: Normal respiratory effort, lungs clear bilaterally GI: Abdomen is soft, nontender, nondistended, no abdominal masses GU: Normal phallus without discharge. Small left hemiscrotal abscess on the lateral aspect, draining purulent bloody material with slight induration. Perineum intact. Skin: No rashes, bruises or suspicious lesions Neurologic: Grossly intact, no focal deficits, moving all 4 extremities Psychiatric: Normal mood and affect  Laboratory Data:   Recent Labs  04/27/17 1055 04/28/17 0406 04/29/17 0001  WBC 15.7* 10.8* 9.9  HGB 11.4* 9.8* 9.5*  HCT 34.3* 28.1* 27.5*    Recent Labs  04/27/17 1055 04/28/17 0406 04/29/17 0001  NA 131* 134* 135  K 3.9 4.0 3.8  CL 97* 102 100*  CO2 19* 23 23  GLUCOSE 125* 111* 128*  BUN 46* 51* 59*  CREATININE 3.71* 3.76* 3.53*  CALCIUM 8.7* 7.9* 8.1*    Recent Labs  04/27/17 1559  INR 1.65   No results for  input(s): LABURIN in the last 72 hours. Results for orders placed or performed during the hospital encounter of 04/27/17  Blood Culture (routine x 2)     Status: None (Preliminary result)   Collection Time: 04/27/17  3:58 PM  Result Value Ref Range Status   Specimen Description BLOOD LEFT HAND  Final   Special Requests   Final    BOTTLES DRAWN AEROBIC AND ANAEROBIC Blood Culture adequate volume   Culture NO GROWTH 2 DAYS  Final   Report Status PENDING  Incomplete  Aerobic/Anaerobic Culture (surgical/deep wound)     Status: None (Preliminary result)   Collection Time: 04/27/17  3:59 PM  Result Value Ref Range Status   Specimen Description WOUND  Final   Special Requests RIGHT AXILLA  Final   Gram Stain   Final    ABUNDANT WBC PRESENT, PREDOMINANTLY MONONUCLEAR ABUNDANT GRAM POSITIVE COCCI IN CLUSTERS    Culture   Final    TOO YOUNG TO READ Performed at [facility], [place] [address] [place], [place] [address]    Report Status PENDING  Incomplete  Blood Culture (routine x 2)     Status: Abnormal   Collection Time: 04/27/17  4:15 PM  Result Value Ref Range Status   Specimen Description BLOOD LEFT HAND  Final   Special Requests   Final    BOTTLES DRAWN AEROBIC AND ANAEROBIC Blood Culture adequate volume   Culture  Setup Time   Final    GRAM POSITIVE RODS AEROBIC BOTTLE ONLY RESULT CALLED TO, READ BACK BY AND VERIFIED WITH: CHRISTINE KATSOUDAS AT 2620 ON 04/28/17 [place].    Culture (A)  Final    BACILLUS SPECIES Standardized susceptibility testing for this organism is not available. Performed at [facility], [place] [address], [place], [place] [address]    Report Status 04/29/2017 FINAL  Final  Blood Culture ID Panel (Reflexed)     Status: None   Collection Time: 04/27/17  4:15 PM  Result Value Ref Range Status   Enterococcus species NOT DETECTED NOT DETECTED Final   Listeria monocytogenes NOT DETECTED NOT DETECTED Final   Staphylococcus species NOT DETECTED NOT DETECTED  Final   Staphylococcus aureus NOT DETECTED NOT DETECTED Final   Streptococcus species NOT DETECTED NOT DETECTED Final   Streptococcus agalactiae NOT DETECTED NOT DETECTED Final   Streptococcus pneumoniae NOT DETECTED NOT DETECTED Final   Streptococcus pyogenes NOT DETECTED NOT DETECTED Final   Acinetobacter baumannii NOT DETECTED NOT DETECTED Final   Enterobacteriaceae species NOT DETECTED NOT DETECTED Final   Enterobacter cloacae complex NOT DETECTED NOT DETECTED Final   Escherichia coli NOT DETECTED NOT DETECTED Final   Klebsiella oxytoca NOT DETECTED NOT DETECTED Final   Klebsiella pneumoniae NOT DETECTED NOT DETECTED Final   Proteus species NOT DETECTED NOT DETECTED Final   Serratia marcescens NOT DETECTED NOT DETECTED Final   Haemophilus influenzae NOT DETECTED NOT DETECTED Final  Neisseria meningitidis NOT DETECTED NOT DETECTED Final   Pseudomonas aeruginosa NOT DETECTED NOT DETECTED Final   Candida albicans NOT DETECTED NOT DETECTED Final   Candida glabrata NOT DETECTED NOT DETECTED Final   Candida krusei NOT DETECTED NOT DETECTED Final   Candida parapsilosis NOT DETECTED NOT DETECTED Final   Candida tropicalis NOT DETECTED NOT DETECTED Final  Urine culture     Status: None   Collection Time: 04/28/17 10:35 AM  Result Value Ref Range Status   Specimen Description URINE, RANDOM  Final   Special Requests NONE  Final   Culture   Final    NO GROWTH Performed at [facility], [place] [address], [place], [place] [address]    Report Status 04/29/2017 FINAL  Final     Radiologic Imaging: Ct Chest Wo Contrast  Result Date: 04/27/2017 CLINICAL DATA:  Worsening dyspnea. 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 EXAM: CT CHEST WITHOUT CONTRAST TECHNIQUE: Multidetector CT imaging of the chest was performed following the standard protocol without IV contrast. COMPARISON:  10/01/2015 chest CT angiogram. FINDINGS: Cardiovascular: Normal heart size. No significant pericardial  fluid/thickening. Left anterior descending, left circumflex and right coronary atherosclerosis. Great vessels are normal in course and caliber. Mediastinum/Nodes: No discrete thyroid nodules. Unremarkable esophagus. There is focal skin thickening in the right axilla. There are multiple newly enlarged lymph nodes in the right axilla, largest measuring 2.1 cm short axis (series 2/ image 58). There is associated extensive fat stranding throughout the right axilla. An irregular 4.3 x 2.4 cm fluid density collection in the deep subcutaneous right axillary region (series 2/image 50) is new. No left axillary adenopathy. No pathologically enlarged mediastinal or gross hilar nodes on this noncontrast scan. Lungs/Pleura: No pneumothorax. No pleural effusion. No acute consolidative airspace disease, lung masses or significant pulmonary nodules. Stable parenchymal bands in the peripheral anterior right lower lobe compatible with postinfectious/postinflammatory scarring. Upper abdomen: Lobulated 8.8 x 8.0 cm cystic lesion in the posterior right liver lobe (series 2/ image 130) with clustered eccentric coarse mural calcifications, which measured 8.5 x 7.6 cm on 07/20/2013 and 7.9 x 6.7 cm on 10/01/2015, mildly increased in size. Musculoskeletal: No aggressive appearing focal osseous lesions. Moderate thoracic spondylosis. IMPRESSION: 1. New right axillary adenopathy with surrounding fat stranding and overlying right axillary skin thickening, and with an irregular 4.3 x 2.4 cm subcutaneous right axillary fluid collection, which could represent an abscess (not well evaluated on this noncontrast scan). Surgical consultation should be considered. 2. No acute pulmonary disease. 3. Three-vessel coronary atherosclerosis. 4. 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 This mass was  characterized as multiloculated and benign appearing on 12/20/2009 MRI abdomen. Electronically Signed   By: Ilona Sorrel M.D.   On: 04/27/2017 15:41   US Renal  Result Date: 04/29/2017 CLINICAL DATA:  Acute renal failure, microscopic hematuria EXAM: RENAL / URINARY TRACT ULTRASOUND COMPLETE COMPARISON:  Renal ultrasound of October 08, 2015 FINDINGS: Right Kidney: Length: 13.5 cm. The renal cortical echotexture is mildly increased but remains lower than that of the adjacent liver. There is no hydronephrosis. There is no focal mass. Left Kidney: Length: 12.0 cm. The renal cortical echotexture is mildly increased similar to that on the right. There is no hydronephrosis. Bladder: Appears normal for degree of bladder distention. Partially imaged is a right lower lobe hepatic cyst. This is been previously demonstrated. IMPRESSION:  No hydronephrosis nor renal or bladder mass is observed. The renal cortical echotexture is mildly increased consistent with medical renal disease. This has been previously described. Electronically Signed   By: David  Martinique M.D.   On: 04/29/2017 08:47    Impression/ Plan: 56 year old male admitted with acute on chronic renal failure the right axillary abscess now with a new left scrotal abscess. Given that he will be undergoing anesthesia for I&D of his axillary abscess, and like to incise the scrotal abscess to help facilitate drainage.  Risks and benefits were discussed in detail. Informed consent was obtained. All questions were answered.  Continue antibiotics and supportive care.  04/29/2017, 12:25 PM  Hollice Espy,  MD

## 2017-04-29 NOTE — Progress Notes (Signed)
[facility] for heparin dosing Indication: pulmonary embolus       Allergies  Allergen Reactions  . Vicodin [Hydrocodone-Acetaminophen] Hives and Other (See Comments)    Rash/hives Other Reaction: severe headaches Rash/hives Other Reaction: severe headaches Rash/hives  . Hydrocodone Rash  . Oxycodone Rash    Takes occasionally.    Patient Measurements: Height: 6\' 2"  (188 cm) Weight: 236 lb (107 kg) IBW/kg (Calculated) : 82.2 Heparin Dosing Weight: 104 kg  Vital Signs: Temp: 97.8 F (36.6 C) (05/07 2145) Temp Source: Oral (05/07 2145) BP: 107/59 (05/07 2145) Pulse Rate: 106 (05/07 2145)  Labs:  Recent Labs (last 2 labs)    Recent Labs  04/27/17 1055 04/27/17 1559  HGB 11.4* --   HCT 34.3* --   PLT 230 --   LABPROT --  19.7*  INR --  1.65  CREATININE 3.71* --   TROPONINI --  0.03*      Estimated Creatinine Clearance: 29.3 mL/min (A) (by C-G formula based on SCr of 3.71 mg/dL (H)).   Medical History:     Past Medical History:  Diagnosis Date  . DVT (deep venous thrombosis) ([place])   . Hyperlipidemia   . Hypertension   . Lupus   . Pulmonary embolism ([place])   . Renal disorder     Medications:  Scheduled:  . ipratropium-albuterol 3 mL Inhalation Q6H  . losartan 100 mg Oral Daily  . mycophenolate 1,000 mg Oral BID  . [START ON 04/28/2017] prednisoLONE 10 mg Oral Daily  . rosuvastatin 10 mg Oral QHS    Assessment: Patient admitted for R axillary abscess w/ h/o DVT and PE anticoagulated w/ apixaban. Patient's last eliquis dose on 5/7 @ 0830. Pt. Is being started on heparin drip  5/8 10:00 aPTT 114, Heparin level 1.36 Currently infusing at 1800 units/hr  Goal of Therapy: Heparin level 0.3-0.7 units/ml, aPTT 66 - 109 sec Monitor platelets by anticoagulation protocol: Yes  Plan: Heparin level still elevated from apixaban. Will continue to use aPTT to guide  therarpy.  APTT slightly above goal, will decrease to Heparin 1700 units/hr. Will check aPTT only in 6hr. Daily CBC and Heparin level with AM labs.  5/8 1755 aPTT therapeutic x 1. Continue current rate. Will recheck aPTT in 6 hours.  5/9 @ 0000 aPTT 126, HL 0.88. HL and aPTT appear to be correlating, will continue to dose based off of aPTT until both in therapeutic range. APTT currently supratherapeutic, will decrease rate to 1600 units/hr and will recheck HL @ 0600. CBC trending down slightly will continue to monitor closely.  5/9 @ 0600 aPTT 92, HL 0.79. Dosing off of aPTT which is therapeutic will continue current rate and recheck aPTT/HL @ 1200.  Thank you for this consult.  Tobie Lords, PharmD, BCPS Clinical Pharmacist 04/29/2017

## 2017-04-29 NOTE — Progress Notes (Signed)
[facility], [place] 04/29/17  Subjective:   No new complaints Scheduled for surgery later today S Creatinine is slightly better   Objective:  Vital signs in last 24 hours:  Temp:  [97.8 F (36.6 C)-98.2 F (36.8 C)] 97.8 F (36.6 C) (05/09 0512) Pulse Rate:  [74-94] 74 (05/09 0512) Resp:  [19-20] 20 (05/09 0512) BP: (116-140)/(68-80) 140/80 (05/09 0512) SpO2:  [95 %-98 %] 98 % (05/09 0512)  Weight change:  Filed Weights   04/27/17 1056  Weight: 107 kg (236 lb)    Intake/Output:    Intake/Output Summary (Last 24 hours) at 04/29/17 1016 Last data filed at 04/29/17 0934  Gross per 24 hour  Intake          1207.89 ml  Output              200 ml  Net          1007.89 ml     Physical Exam: General: No acute distress, lying in the bed  HEENT Anicteric, moist oral mucous membranes  Neck supple  Pulm/lungs Normal breathing effort, clear to auscultation  CVS/Heart Regular, no rub or gallop  Abdomen:  Soft, nontender, nondistended  Extremities: Trace edema  Neurologic: Alert, oriented  Skin: Right axillary abscess, bandage in place          Basic Metabolic Panel:   Recent Labs Lab 04/27/17 1055 04/28/17 0406 04/29/17 0001  NA 131* 134* 135  K 3.9 4.0 3.8  CL 97* 102 100*  CO2 19* 23 23  GLUCOSE 125* 111* 128*  BUN 46* 51* 59*  CREATININE 3.71* 3.76* 3.53*  CALCIUM 8.7* 7.9* 8.1*     CBC:  Recent Labs Lab 04/27/17 1055 04/28/17 0406 04/29/17 0001  WBC 15.7* 10.8* 9.9  HGB 11.4* 9.8* 9.5*  HCT 34.3* 28.1* 27.5*  MCV 88.0 89.1 89.6  PLT 230 213 213     No results found for: HEPBSAG, HEPBSAB, HEPBIGM    Microbiology:  Recent Results (from the past 240 hour(s))  Blood Culture (routine x 2)     Status: None (Preliminary result)   Collection Time: 04/27/17  3:58 PM  Result Value Ref Range Status   Specimen Description BLOOD LEFT HAND  Final   Special Requests   Final    BOTTLES DRAWN AEROBIC AND ANAEROBIC  Blood Culture adequate volume   Culture NO GROWTH 2 DAYS  Final   Report Status PENDING  Incomplete  Aerobic/Anaerobic Culture (surgical/deep wound)     Status: None (Preliminary result)   Collection Time: 04/27/17  3:59 PM  Result Value Ref Range Status   Specimen Description WOUND  Final   Special Requests RIGHT AXILLA  Final   Gram Stain   Final    ABUNDANT WBC PRESENT, PREDOMINANTLY MONONUCLEAR ABUNDANT GRAM POSITIVE COCCI IN CLUSTERS    Culture   Final    TOO YOUNG TO READ Performed at [facility], [address] [place], [place] [address]    Report Status PENDING  Incomplete  Blood Culture (routine x 2)     Status: None (Preliminary result)   Collection Time: 04/27/17  4:15 PM  Result Value Ref Range Status   Specimen Description BLOOD LEFT HAND  Final   Special Requests   Final    BOTTLES DRAWN AEROBIC AND ANAEROBIC Blood Culture adequate volume   Culture  Setup Time   Final    Organism ID to follow GRAM POSITIVE RODS AEROBIC BOTTLE ONLY RESULT CALLED  TO, READ BACK BY AND VERIFIED WITH: CHRISTINE KATSOUDAS AT 9147 ON 04/28/17 [place].    Culture GRAM POSITIVE RODS  Final   Report Status PENDING  Incomplete  Blood Culture ID Panel (Reflexed)     Status: None   Collection Time: 04/27/17  4:15 PM  Result Value Ref Range Status   Enterococcus species NOT DETECTED NOT DETECTED Final   Listeria monocytogenes NOT DETECTED NOT DETECTED Final   Staphylococcus species NOT DETECTED NOT DETECTED Final   Staphylococcus aureus NOT DETECTED NOT DETECTED Final   Streptococcus species NOT DETECTED NOT DETECTED Final   Streptococcus agalactiae NOT DETECTED NOT DETECTED Final   Streptococcus pneumoniae NOT DETECTED NOT DETECTED Final   Streptococcus pyogenes NOT DETECTED NOT DETECTED Final   Acinetobacter baumannii NOT DETECTED NOT DETECTED Final   Enterobacteriaceae species NOT DETECTED NOT DETECTED Final   Enterobacter cloacae complex NOT DETECTED NOT DETECTED Final    Escherichia coli NOT DETECTED NOT DETECTED Final   Klebsiella oxytoca NOT DETECTED NOT DETECTED Final   Klebsiella pneumoniae NOT DETECTED NOT DETECTED Final   Proteus species NOT DETECTED NOT DETECTED Final   Serratia marcescens NOT DETECTED NOT DETECTED Final   Haemophilus influenzae NOT DETECTED NOT DETECTED Final   Neisseria meningitidis NOT DETECTED NOT DETECTED Final   Pseudomonas aeruginosa NOT DETECTED NOT DETECTED Final   Candida albicans NOT DETECTED NOT DETECTED Final   Candida glabrata NOT DETECTED NOT DETECTED Final   Candida krusei NOT DETECTED NOT DETECTED Final   Candida parapsilosis NOT DETECTED NOT DETECTED Final   Candida tropicalis NOT DETECTED NOT DETECTED Final    Coagulation Studies:  Recent Labs  04/27/17 1559  LABPROT 19.7*  INR 1.65    Urinalysis:  Recent Labs  04/28/17 1035  COLORURINE YELLOW*  LABSPEC 1.008  PHURINE 5.0  GLUCOSEU NEGATIVE  HGBUR LARGE*  BILIRUBINUR NEGATIVE  KETONESUR NEGATIVE  PROTEINUR 30*  NITRITE NEGATIVE  LEUKOCYTESUR NEGATIVE      Imaging: Ct Chest Wo Contrast  Result Date: 04/27/2017 CLINICAL DATA:  Worsening dyspnea. 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 EXAM: CT CHEST WITHOUT CONTRAST TECHNIQUE: Multidetector CT imaging of the chest was performed following the standard protocol without IV contrast. COMPARISON:  10/01/2015 chest CT angiogram. FINDINGS: Cardiovascular: Normal heart size. No significant pericardial fluid/thickening. Left anterior descending, left circumflex and right coronary atherosclerosis. Great vessels are normal in course and caliber. Mediastinum/Nodes: No discrete thyroid nodules. Unremarkable esophagus. There is focal skin thickening in the right axilla. There are multiple newly enlarged lymph nodes in the right axilla, largest measuring 2.1 cm short axis (series 2/ image 58). There is associated extensive fat stranding throughout the right axilla. An irregular 4.3 x 2.4 cm fluid density  collection in the deep subcutaneous right axillary region (series 2/image 50) is new. No left axillary adenopathy. No pathologically enlarged mediastinal or gross hilar nodes on this noncontrast scan. Lungs/Pleura: No pneumothorax. No pleural effusion. No acute consolidative airspace disease, lung masses or significant pulmonary nodules. Stable parenchymal bands in the peripheral anterior right lower lobe compatible with postinfectious/postinflammatory scarring. Upper abdomen: Lobulated 8.8 x 8.0 cm cystic lesion in the posterior right liver lobe (series 2/ image 130) with clustered eccentric coarse mural calcifications, which measured 8.5 x 7.6 cm on 07/20/2013 and 7.9 x 6.7 cm on 10/01/2015, mildly increased in size. Musculoskeletal: No aggressive appearing focal osseous lesions. Moderate thoracic spondylosis. IMPRESSION: 1. New right axillary adenopathy with surrounding fat stranding and overlying right axillary skin thickening, and with an irregular 4.3  x 2.4 cm subcutaneous right axillary fluid collection, which could represent an abscess (not well evaluated on this noncontrast scan). Surgical consultation should be considered. 2. No acute pulmonary disease. 3. Three-vessel coronary atherosclerosis. 4. 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 This mass was characterized as multiloculated and benign appearing on 12/20/2009 MRI abdomen. Electronically Signed   By: Ilona Sorrel M.D.   On: 04/27/2017 15:41   US Renal  Result Date: 04/29/2017 CLINICAL DATA:  Acute renal failure, microscopic hematuria EXAM: RENAL / URINARY TRACT ULTRASOUND COMPLETE COMPARISON:  Renal ultrasound of October 08, 2015 FINDINGS: Right Kidney: Length: 13.5 cm. The renal cortical echotexture is mildly increased but remains lower than that of the adjacent liver. There is no  hydronephrosis. There is no focal mass. Left Kidney: Length: 12.0 cm. The renal cortical echotexture is mildly increased similar to that on the right. There is no hydronephrosis. Bladder: Appears normal for degree of bladder distention. Partially imaged is a right lower lobe hepatic cyst. This is been previously demonstrated. IMPRESSION: No hydronephrosis nor renal or bladder mass is observed. The renal cortical echotexture is mildly increased consistent with medical renal disease. This has been previously described. Electronically Signed   By: David  Martinique M.D.   On: 04/29/2017 08:47     Medications:   . heparin 1,600 Units/hr (04/29/17 0425)  . piperacillin-tazobactam (ZOSYN)  IV 3.375 g (04/29/17 0856)  . vancomycin Stopped (04/29/17 0023)   . losartan  100 mg Oral Daily  . mycophenolate  1,000 mg Oral BID  . prednisoLONE  10 mg Oral Daily  . rosuvastatin  10 mg Oral QHS   diphenhydrAMINE, docusate sodium, ipratropium-albuterol, morphine injection  Assessment/ Plan:  56 y.o.  male with past history of pulmonary embolus, history of IVC filter placement, right ankle fracture status post ORIF, and herniated disc, lupus nephritis, proteinuria, CKD 3, hypertension  1.  Acute renal failure on chronic kidney disease stage III 2.  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  Relapse February 2018 3.  Right axillary abscess 4.  Hematuria and proteinuria  Acute worsening of renal failure appears to be secondary to concurrent abscess/infection in the right axillary area.  Patient is being treated with broad-spectrum antibiotics.  Surgery team is following along. Urine protein to creatinine ratio 0.73.  Complement levels are pending Patient has had long-standing hematuria. He is currently on anticoagulation which may be playing a role Scheduled for abscess I&D today Will follow    LOS: [place] 5/9/201810:16 AM  [place], [place]

## 2017-04-29 NOTE — Anesthesia Procedure Notes (Signed)
Procedure Name: LMA Insertion Date/Time: 04/29/2017 1:57 PM Performed by: Marsh Dolly Pre-anesthesia Checklist: Patient identified, Patient being monitored, Timeout performed, Emergency Drugs available and Suction available Patient Re-evaluated:Patient Re-evaluated prior to inductionOxygen Delivery Method: Circle system utilized Preoxygenation: Pre-oxygenation with 100% oxygen Intubation Type: IV induction Ventilation: Mask ventilation without difficulty LMA: LMA inserted LMA Size: 5.0 Tube type: Oral Number of attempts: 1 Placement Confirmation: positive ETCO2 and breath sounds checked- equal and bilateral Tube secured with: Tape Dental Injury: Teeth and Oropharynx as per pre-operative assessment

## 2017-04-29 NOTE — Anesthesia Post-op Follow-up Note (Cosign Needed)
Anesthesia QCDR form completed.        

## 2017-04-29 NOTE — Anesthesia Preprocedure Evaluation (Addendum)
Anesthesia Evaluation  Patient identified by MRN, date of birth, ID band Patient awake    Reviewed: Allergy & Precautions, NPO status , Patient's Chart, lab work & pertinent test results, reviewed documented beta blocker date and time   Airway Mallampati: III  TM Distance: >3 FB     Dental  (+) Chipped   Pulmonary COPD,           Cardiovascular hypertension, Pt. on medications + Peripheral Vascular Disease       Neuro/Psych    GI/Hepatic   Endo/Other    Renal/GU CRFRenal disease     Musculoskeletal   Abdominal   Peds  Hematology   Anesthesia Other Findings Stage 3 renal. Hb 9.5. EKG ok. Lupus. PE in 2014.  Reproductive/Obstetrics                            Anesthesia Physical Anesthesia Plan  ASA: III  Anesthesia Plan: General   Post-op Pain Management:    Induction: Intravenous  Airway Management Planned: Oral ETT and LMA  Additional Equipment:   Intra-op Plan:   Post-operative Plan:   Informed Consent: I have reviewed the patients History and Physical, chart, labs and discussed the procedure including the risks, benefits and alternatives for the proposed anesthesia with the patient or authorized representative who has indicated his/her understanding and acceptance.     Plan Discussed with: CRNA  Anesthesia Plan Comments:         Anesthesia Quick Evaluation

## 2017-04-29 NOTE — Progress Notes (Signed)
[place] for heparin dosing Indication: pulmonary embolus       Allergies  Allergen Reactions  . Vicodin [Hydrocodone-Acetaminophen] Hives and Other (See Comments)    Rash/hives Other Reaction: severe headaches Rash/hives Other Reaction: severe headaches Rash/hives  . Hydrocodone Rash  . Oxycodone Rash    Takes occasionally.    Patient Measurements: Height: 6\' 2"  (188 cm) Weight: 236 lb (107 kg) IBW/kg (Calculated) : 82.2 Heparin Dosing Weight: 104 kg  Vital Signs: Temp: 97.8 F (36.6 C) (05/07 2145) Temp Source: Oral (05/07 2145) BP: 107/59 (05/07 2145) Pulse Rate: 106 (05/07 2145)  Labs:  Recent Labs (last 2 labs)    Recent Labs  04/27/17 1055 04/27/17 1559  HGB 11.4* --   HCT 34.3* --   PLT 230 --   LABPROT --  19.7*  INR --  1.65  CREATININE 3.71* --   TROPONINI --  0.03*      Estimated Creatinine Clearance: 29.3 mL/min (A) (by C-G formula based on SCr of 3.71 mg/dL (H)).   Medical History:     Past Medical History:  Diagnosis Date  . DVT (deep venous thrombosis) ([place])   . Hyperlipidemia   . Hypertension   . Lupus   . Pulmonary embolism ([place])   . Renal disorder     Medications:  Scheduled:  . ipratropium-albuterol 3 mL Inhalation Q6H  . losartan 100 mg Oral Daily  . mycophenolate 1,000 mg Oral BID  . [START ON 04/28/2017] prednisoLONE 10 mg Oral Daily  . rosuvastatin 10 mg Oral QHS    Assessment: Patient admitted for R axillary abscess w/ h/o DVT and PE anticoagulated w/ apixaban. Patient's last eliquis dose on 5/7 @ 0830. Pt. Is being started on heparin drip  5/8 10:00 aPTT 114, Heparin level 1.36 Currently infusing at 1800 units/hr  Goal of Therapy: Heparin level 0.3-0.7 units/ml, aPTT 66 - 109 sec Monitor platelets by anticoagulation protocol: Yes  Plan: Heparin level still elevated from apixaban. Will continue to use aPTT to guide  therarpy.  APTT slightly above goal, will decrease to Heparin 1700 units/hr. Will check aPTT only in 6hr. Daily CBC and Heparin level with AM labs.  5/8 1755 aPTT therapeutic x 1. Continue current rate. Will recheck aPTT in 6 hours.  5/9 @ 0000 aPTT 126, HL 0.88. HL and aPTT appear to be correlating, will continue to dose based off of aPTT until both in therapeutic range. APTT currently supratherapeutic, will decrease rate to 1600 units/hr and will recheck HL @ 0600. CBC trending down slightly will continue to monitor closely.  Thank you for this consult.  Tobie Lords, PharmD, BCPS Clinical Pharmacist 04/29/2017

## 2017-04-29 NOTE — Anesthesia Postprocedure Evaluation (Signed)
Anesthesia Post Note  Patient: Jonathon Snow  Procedure(s) Performed: Procedure(s) (LRB): IRRIGATION AND DEBRIDEMENT EXTREMITY (Right) IRRIGATION AND DEBRIDEMENT Scrotal ABSCESS (Left)  Patient location during evaluation: PACU Anesthesia Type: General Level of consciousness: awake and alert Pain management: pain level controlled Vital Signs Assessment: post-procedure vital signs reviewed and stable Respiratory status: spontaneous breathing, nonlabored ventilation, respiratory function stable and patient connected to nasal cannula oxygen Cardiovascular status: blood pressure returned to baseline and stable Postop Assessment: no signs of nausea or vomiting Anesthetic complications: no     Last Vitals:  Vitals:   04/29/17 1611 04/29/17 1720  BP: 131/88 127/84  Pulse: 84 86  Resp: 20 20  Temp: 36.4 C 36.5 C    Last Pain:  Vitals:   04/29/17 1720  TempSrc: Oral  PainSc:                  Shacoria Latif S

## 2017-04-29 NOTE — Progress Notes (Signed)
[facility] [place] NAME: Jonathon Snow    MR#:  [id number]  DATE OF BIRTH:  26-Feb-1961  SUBJECTIVE:  CHIEF COMPLAINT:  Reporting swelling on his scrotum  REVIEW OF SYSTEMS:  CONSTITUTIONAL: No fever, fatigue or weakness.  EYES: No blurred or double vision.  EARS, NOSE, AND THROAT: No tinnitus or ear pain.  RESPIRATORY: No cough,Slightly improved shortness of breath, no wheezing or hemoptysis.  CARDIOVASCULAR: No chest pain, orthopnea, edema.  GASTROINTESTINAL: No nausea, vomiting, diarrhea or abdominal pain.  GENITOURINARY: No dysuria, hematuria.  ENDOCRINE: No polyuria, nocturia,  HEMATOLOGY: No anemia, easy bruising or bleeding SKIN: No rash or lesion. MUSCULOSKELETAL: No joint pain or arthritis.   NEUROLOGIC: No tingling, numbness, weakness.  PSYCHIATRY: No anxiety or depression.   DRUG ALLERGIES:   Allergies  Allergen Reactions  . Vicodin [Hydrocodone-Acetaminophen] Hives and Other (See Comments)    Rash/hives Other Reaction: severe headaches Rash/hives Other Reaction: severe headaches Rash/hives  . Hydrocodone Rash  . Oxycodone Rash    Takes occasionally.    VITALS:  Blood pressure (!) 141/84, pulse 79, temperature 97.2 F (36.2 C), temperature source Tympanic, resp. rate 20, height 6\' 2"  (1.88 m), weight 107 kg (236 lb), SpO2 98 %.  PHYSICAL EXAMINATION:  GENERAL:  56 y.o.-year-old patient lying in the bed with no acute distress.  EYES: Pupils equal, round, reactive to light and accommodation. No scleral icterus. Extraocular muscles intact.  HEENT: Head atraumatic, normocephalic. Oropharynx and nasopharynx clear.  NECK:  Supple, no jugular venous distention. No thyroid enlargement, no tenderness.  LUNGS: Normal breath sounds bilaterally, no wheezing, rales,rhonchi or crepitation. No use of accessory muscles of respiration.  CARDIOVASCULAR: S1, S2 normal. No murmurs, rubs, or gallops.  ABDOMEN: Soft,  nontender, nondistended. Bowel sounds present. Scrotum with small abscess, fluctuant and tender( examined patient in the presence of RN) EXTREMITIES: No pedal edema, cyanosis, or clubbing.  NEUROLOGIC: Cranial nerves II through XII are intact. Muscle strength 5/5 in all extremities. Sensation intact. Gait not checked.  PSYCHIATRIC: The patient is alert and oriented x 3.  SKIN: No obvious rash, lesion, or ulcer.    LABORATORY PANEL:   CBC  Recent Labs Lab 04/29/17 0001  WBC 9.9  HGB 9.5*  HCT 27.5*  PLT 213   ------------------------------------------------------------------------------------------------------------------  Chemistries   Recent Labs Lab 04/27/17 1559  04/29/17 0001  NA  --   < > 135  K  --   < > 3.8  CL  --   < > 100*  CO2  --   < > 23  GLUCOSE  --   < > 128*  BUN  --   < > 59*  CREATININE  --   < > 3.53*  CALCIUM  --   < > 8.1*  AST 18  --   --   ALT 22  --   --   ALKPHOS 57  --   --   BILITOT 1.0  --   --   < > = values in this interval not displayed. ------------------------------------------------------------------------------------------------------------------  Cardiac Enzymes  Recent Labs Lab 04/27/17 1559  TROPONINI 0.03*   ------------------------------------------------------------------------------------------------------------------  RADIOLOGY:  Ct Chest Wo Contrast  Result Date: 04/27/2017 CLINICAL DATA:  Worsening dyspnea. 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 EXAM: CT CHEST WITHOUT CONTRAST TECHNIQUE: Multidetector CT imaging of the chest was performed following the standard protocol without IV contrast. COMPARISON:  10/01/2015 chest CT angiogram. FINDINGS: Cardiovascular: Normal heart size. No significant pericardial  fluid/thickening. Left anterior descending, left circumflex and right coronary atherosclerosis. Great vessels are normal in course and caliber. Mediastinum/Nodes: No discrete thyroid nodules. Unremarkable esophagus.  There is focal skin thickening in the right axilla. There are multiple newly enlarged lymph nodes in the right axilla, largest measuring 2.1 cm short axis (series 2/ image 58). There is associated extensive fat stranding throughout the right axilla. An irregular 4.3 x 2.4 cm fluid density collection in the deep subcutaneous right axillary region (series 2/image 50) is new. No left axillary adenopathy. No pathologically enlarged mediastinal or gross hilar nodes on this noncontrast scan. Lungs/Pleura: No pneumothorax. No pleural effusion. No acute consolidative airspace disease, lung masses or significant pulmonary nodules. Stable parenchymal bands in the peripheral anterior right lower lobe compatible with postinfectious/postinflammatory scarring. Upper abdomen: Lobulated 8.8 x 8.0 cm cystic lesion in the posterior right liver lobe (series 2/ image 130) with clustered eccentric coarse mural calcifications, which measured 8.5 x 7.6 cm on 07/20/2013 and 7.9 x 6.7 cm on 10/01/2015, mildly increased in size. Musculoskeletal: No aggressive appearing focal osseous lesions. Moderate thoracic spondylosis. IMPRESSION: 1. New right axillary adenopathy with surrounding fat stranding and overlying right axillary skin thickening, and with an irregular 4.3 x 2.4 cm subcutaneous right axillary fluid collection, which could represent an abscess (not well evaluated on this noncontrast scan). Surgical consultation should be considered. 2. No acute pulmonary disease. 3. Three-vessel coronary atherosclerosis. 4. 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 This mass was characterized as multiloculated and benign appearing on 12/20/2009 MRI abdomen. Electronically Signed   By: Ilona Sorrel M.D.   On: 04/27/2017 15:41   US Renal  Result Date: 04/29/2017 CLINICAL DATA:  Acute renal  failure, microscopic hematuria EXAM: RENAL / URINARY TRACT ULTRASOUND COMPLETE COMPARISON:  Renal ultrasound of October 08, 2015 FINDINGS: Right Kidney: Length: 13.5 cm. The renal cortical echotexture is mildly increased but remains lower than that of the adjacent liver. There is no hydronephrosis. There is no focal mass. Left Kidney: Length: 12.0 cm. The renal cortical echotexture is mildly increased similar to that on the right. There is no hydronephrosis. Bladder: Appears normal for degree of bladder distention. Partially imaged is a right lower lobe hepatic cyst. This is been previously demonstrated. IMPRESSION: No hydronephrosis nor renal or bladder mass is observed. The renal cortical echotexture is mildly increased consistent with medical renal disease. This has been previously described. Electronically Signed   By: David  Martinique M.D.   On: 04/29/2017 08:47    EKG:   Orders placed or performed during the hospital encounter of 04/27/17  . ED EKG  . ED EKG  . EKG 12-Lead  . EKG 12-Lead    ASSESSMENT AND PLAN:   * Acute on chronic renal failure 3 . History of lupus nephritis   worsening of renal function could be from right axillary abscess. On IV antibiotics   Currently he is on steroids 10 mg oral prednisone daily and CellCept, continue the same    follow-up with nephrology. Complement levels pending Cr 3.76-3.53  *Chronic hematuria- on anticoagulation heparin gtt currently, holding Eliquis  Follow-up with urology  * Right axillary abscess For incision and drainage by surgery today. Continue IV antibiotics Zosyn and vancomycin.  * scrotal abscess-Urology is consulted. Dr. Erlene Quan is going to incise the scrotal abscess while patient is getting axillary abscess drained, will follow  up with Dr. Erlene Quan  * Lupus   .Currently he is on steroids 10 mg oral prednisone daily and CellCept, continue the same.  * History of DVT and PE   Continue anticoagulation with heparin drip holding  Eliquis  * Hypertension   Continue losartan.     All the records are reviewed and case discussed with Care Management/Social Workerr. Management plans discussed with the patient, family and they are in agreement.  CODE STATUS: fc   TOTAL TIME TAKING CARE OF THIS PATIENT: 35  minutes.   POSSIBLE D/C IN 2-3  DAYS, DEPENDING ON CLINICAL CONDITION.  Note: This dictation was prepared with Dragon dictation along with smaller phrase technology. Any transcriptional errors that result from this process are unintentional.   [name] M.D on 04/29/2017 at 2:16 PM  Between 7am to 6pm - Pager - [phone] After 6pm go to [email or web address] - password [email or web address] [facility]  [place] [facility]  Office  [phone]  CC: Primary care physician; Roselee Nova, MD

## 2017-04-29 NOTE — Progress Notes (Signed)
CC: Axillary abscess Subjective: Surgery following patient for axillary abscess. Since admission the patient has continued to have copious, purulent output from the axillary abscess. Patient reports that the dressing this was in place has been changed numerous times overnight saturation. Continues to cause discomfort to the entire area.  Objective: Vital signs in last 24 hours: Temp:  [97.8 F (36.6 C)-98.2 F (36.8 C)] 97.8 F (36.6 C) (05/09 0512) Pulse Rate:  [74-94] 74 (05/09 0512) Resp:  [19-20] 20 (05/09 0512) BP: (116-140)/(68-80) 140/80 (05/09 0512) SpO2:  [95 %-98 %] 98 % (05/09 0512) Last BM Date: 04/28/17  Intake/Output from previous day: 05/08 0701 - 05/09 0700 In: 1556 [P.O.:720; I.V.:504; IV Piggyback:332] Out: 500 [Urine:500] Intake/Output this shift: Total I/O In: 47.9 [I.V.:47.9] Out: 200 [Urine:200]  Physical exam:  Gen.: No acute distress Chest: Clear to auscultation Heart: Regular rhythm Abdomen: Soft, nontender Skin: Right axilla with spontaneously draining abscess. Copious amounts of purulent fluid able to be expressed from the wound this morning. No spreading erythema.  Lab Results: CBC   Recent Labs  04/28/17 0406 04/29/17 0001  WBC 10.8* 9.9  HGB 9.8* 9.5*  HCT 28.1* 27.5*  PLT 213 213   BMET  Recent Labs  04/28/17 0406 04/29/17 0001  NA 134* 135  K 4.0 3.8  CL 102 100*  CO2 23 23  GLUCOSE 111* 128*  BUN 51* 59*  CREATININE 3.76* 3.53*  CALCIUM 7.9* 8.1*   PT/INR  Recent Labs  04/27/17 1559  LABPROT 19.7*  INR 1.65   ABG No results for input(s): PHART, HCO3 in the last 72 hours.  Invalid input(s): PCO2, PO2  Studies/Results: Ct Chest Wo Contrast  Result Date: 04/27/2017 CLINICAL DATA:  Worsening dyspnea. 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 EXAM: CT CHEST WITHOUT CONTRAST TECHNIQUE: Multidetector CT imaging of the chest was performed following the standard protocol without IV contrast. COMPARISON:  10/01/2015 chest  CT angiogram. FINDINGS: Cardiovascular: Normal heart size. No significant pericardial fluid/thickening. Left anterior descending, left circumflex and right coronary atherosclerosis. Great vessels are normal in course and caliber. Mediastinum/Nodes: No discrete thyroid nodules. Unremarkable esophagus. There is focal skin thickening in the right axilla. There are multiple newly enlarged lymph nodes in the right axilla, largest measuring 2.1 cm short axis (series 2/ image 58). There is associated extensive fat stranding throughout the right axilla. An irregular 4.3 x 2.4 cm fluid density collection in the deep subcutaneous right axillary region (series 2/image 50) is new. No left axillary adenopathy. No pathologically enlarged mediastinal or gross hilar nodes on this noncontrast scan. Lungs/Pleura: No pneumothorax. No pleural effusion. No acute consolidative airspace disease, lung masses or significant pulmonary nodules. Stable parenchymal bands in the peripheral anterior right lower lobe compatible with postinfectious/postinflammatory scarring. Upper abdomen: Lobulated 8.8 x 8.0 cm cystic lesion in the posterior right liver lobe (series 2/ image 130) with clustered eccentric coarse mural calcifications, which measured 8.5 x 7.6 cm on 07/20/2013 and 7.9 x 6.7 cm on 10/01/2015, mildly increased in size. Musculoskeletal: No aggressive appearing focal osseous lesions. Moderate thoracic spondylosis. IMPRESSION: 1. New right axillary adenopathy with surrounding fat stranding and overlying right axillary skin thickening, and with an irregular 4.3 x 2.4 cm subcutaneous right axillary fluid collection, which could represent an abscess (not well evaluated on this noncontrast scan). Surgical consultation should be considered. 2. No acute pulmonary disease. 3. Three-vessel coronary atherosclerosis. 4. 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 This mass was characterized as multiloculated and benign appearing on 12/20/2009 MRI abdomen. Electronically Signed   By: Ilona Sorrel M.D.   On: 04/27/2017 15:41   US Renal  Result Date: 04/29/2017 CLINICAL DATA:  Acute renal failure, microscopic hematuria EXAM: RENAL / URINARY TRACT ULTRASOUND COMPLETE COMPARISON:  Renal ultrasound of October 08, 2015 FINDINGS: Right Kidney: Length: 13.5 cm. The renal cortical echotexture is mildly increased but remains lower than that of the adjacent liver. There is no hydronephrosis. There is no focal mass. Left Kidney: Length: 12.0 cm. The renal cortical echotexture is mildly increased similar to that on the right. There is no hydronephrosis. Bladder: Appears normal for degree of bladder distention. Partially imaged is a right lower lobe hepatic cyst. This is been previously demonstrated. IMPRESSION: No hydronephrosis nor renal or bladder mass is observed. The renal cortical echotexture is mildly increased consistent with medical renal disease. This has been previously described. Electronically Signed   By: David  Martinique M.D.   On: 04/29/2017 08:47    Anti-infectives: Anti-infectives    Start     Dose/Rate Route Frequency Ordered Stop   04/28/17 0000  vancomycin (VANCOCIN) IVPB 1000 mg/200 mL premix     1,000 mg 200 mL/hr over 60 Minutes Intravenous Every 24 hours 04/27/17 1508     04/27/17 2200  piperacillin-tazobactam (ZOSYN) IVPB 3.375 g     3.375 g 12.5 mL/hr over 240 Minutes Intravenous Every 8 hours 04/27/17 1508     04/27/17 1515  piperacillin-tazobactam (ZOSYN) IVPB 3.375 g     3.375 g 100 mL/hr over 30 Minutes Intravenous  Once 04/27/17 1503 04/27/17 1730   04/27/17 1515  vancomycin (VANCOCIN) IVPB 1000 mg/200 mL premix     1,000 mg 200 mL/hr over 60 Minutes Intravenous  Once 04/27/17 1503 04/27/17 1834      Assessment/Plan:  56 year old male with multiple medical problems and a  spontaneously draining right axillary abscess. Discussed with the patient given the amount of drainage and the persistence of the drainage that it warrants a trip to the operating room for surgical drainage and washout. Patient voiced understanding. The procedure was described in detail. He'll be posted as an add-on case and likely taken the operating room this afternoon.  Diavian Furgason T. Adonis Huguenin, MD, [facility] General Surgeon [facility]  Day [facility] [phone] Night [facility] [phone] 04/29/2017

## 2017-04-30 ENCOUNTER — Encounter: Payer: Self-pay | Admitting: General Surgery

## 2017-04-30 DIAGNOSIS — R3129 Other microscopic hematuria: Secondary | ICD-10-CM

## 2017-04-30 LAB — CBC
HCT: 31 % — ABNORMAL LOW (ref 40.0–52.0)
HEMOGLOBIN: 10.6 g/dL — AB (ref 13.0–18.0)
MCH: 30.2 pg (ref 26.0–34.0)
MCHC: 34.3 g/dL (ref 32.0–36.0)
MCV: 88 fL (ref 80.0–100.0)
Platelets: 316 10*3/uL (ref 150–440)
RBC: 3.52 MIL/uL — AB (ref 4.40–5.90)
RDW: 13.2 % (ref 11.5–14.5)
WBC: 10.8 10*3/uL — ABNORMAL HIGH (ref 3.8–10.6)

## 2017-04-30 LAB — BASIC METABOLIC PANEL
ANION GAP: 10 (ref 5–15)
BUN: 53 mg/dL — ABNORMAL HIGH (ref 6–20)
CHLORIDE: 105 mmol/L (ref 101–111)
CO2: 20 mmol/L — AB (ref 22–32)
Calcium: 8.9 mg/dL (ref 8.9–10.3)
Creatinine, Ser: 3.14 mg/dL — ABNORMAL HIGH (ref 0.61–1.24)
GFR calc non Af Amer: 21 mL/min — ABNORMAL LOW (ref >60)
GFR, EST AFRICAN AMERICAN: 24 mL/min — AB (ref >60)
Glucose, Bld: 109 mg/dL — ABNORMAL HIGH (ref 65–99)
POTASSIUM: 4.6 mmol/L (ref 3.5–5.1)
SODIUM: 135 mmol/L (ref 135–145)

## 2017-04-30 LAB — C3 COMPLEMENT: C3 COMPLEMENT: 126 mg/dL (ref 82–167)

## 2017-04-30 LAB — C4 COMPLEMENT: COMPLEMENT C4, BODY FLUID: 21 mg/dL (ref 14–44)

## 2017-04-30 MED ORDER — TRAMADOL HCL 50 MG PO TABS
100.0000 mg | ORAL_TABLET | Freq: Four times a day (QID) | ORAL | Status: DC | PRN
  Administered 2017-04-30 – 2017-05-03 (×4): 100 mg via ORAL
  Filled 2017-04-30 (×4): qty 2

## 2017-04-30 NOTE — Progress Notes (Signed)
Urology Consult Follow Up  Subjective: No issues s/p scrotal I&D, gram stain with gram positive cocci.  Reviewed previous UAs + blood.  No previous hematuria work up, on chronic anticoagulation.  Anti-infectives: Anti-infectives    Start     Dose/Rate Route Frequency Ordered Stop   04/28/17 0000  vancomycin (VANCOCIN) IVPB 1000 mg/200 mL premix     1,000 mg 200 mL/hr over 60 Minutes Intravenous Every 24 hours 04/27/17 1508     04/27/17 2200  piperacillin-tazobactam (ZOSYN) IVPB 3.375 g     3.375 g 12.5 mL/hr over 240 Minutes Intravenous Every 8 hours 04/27/17 1508     04/27/17 1515  piperacillin-tazobactam (ZOSYN) IVPB 3.375 g     3.375 g 100 mL/hr over 30 Minutes Intravenous  Once 04/27/17 1503 04/27/17 1730   04/27/17 1515  vancomycin (VANCOCIN) IVPB 1000 mg/200 mL premix     1,000 mg 200 mL/hr over 60 Minutes Intravenous  Once 04/27/17 1503 04/27/17 1834      Current Facility-Administered Medications  Medication Dose Route Frequency Provider Last Rate Last Dose  . apixaban (ELIQUIS) tablet 5 mg  5 mg Oral BID Gouru, Aruna, MD   5 mg at 04/30/17 0842  . Chlorhexidine Gluconate Cloth 2 % PADS 6 each  6 each Topical Q0600 Gouru, Aruna, MD      . diphenhydrAMINE (BENADRYL) injection 12.5 mg  12.5 mg Intramuscular QHS PRN Harrie Foreman, MD      . docusate sodium (COLACE) capsule 100 mg  100 mg Oral BID PRN Vaughan Basta, MD      . ipratropium-albuterol (DUONEB) 0.5-2.5 (3) MG/3ML nebulizer solution 3 mL  3 mL Inhalation Q4H PRN Vaughan Basta, MD      . losartan (COZAAR) tablet 100 mg  100 mg Oral Daily Vaughan Basta, MD   100 mg at 04/30/17 0843  . morphine 2 MG/ML injection 1 mg  1 mg Intravenous Q4H PRN Harrie Foreman, MD   1 mg at 04/30/17 1601  . mupirocin ointment (BACTROBAN) 2 % 1 application  1 application Nasal BID [name], MD   1 application at 70/48/88 2219  . mycophenolate (CELLCEPT) capsule 1,000 mg  1,000 mg Oral BID Vaughan Basta, MD   1,000 mg at 04/30/17 0843  . piperacillin-tazobactam (ZOSYN) IVPB 3.375 g  3.375 g Intravenous Q8H Hinda Kehr, MD 12.5 mL/hr at 04/30/17 1601 3.375 g at 04/30/17 1601  . prednisoLONE (ORAPRED) 15 MG/5ML solution 10 mg  10 mg Oral Daily Vaughan Basta, MD   10 mg at 04/30/17 0843  . rosuvastatin (CRESTOR) tablet 10 mg  10 mg Oral QHS Vaughan Basta, MD   10 mg at 04/29/17 2218  . traMADol (ULTRAM) tablet 100 mg  100 mg Oral Q6H PRN Gouru, Aruna, MD   100 mg at 04/30/17 1207  . vancomycin (VANCOCIN) IVPB 1000 mg/200 mL premix  1,000 mg Intravenous Q24H Hinda Kehr, MD   Stopped at 04/30/17 0059     Objective: Vital signs in last 24 hours: Temp:  [97.7 F (36.5 C)-97.8 F (36.6 C)] 97.8 F (36.6 C) (05/10 0618) Pulse Rate:  [72-86] 77 (05/10 1213) Resp:  [14-20] 14 (05/10 0618) BP: (127-162)/(78-87) 138/78 (05/10 1213) SpO2:  [97 %-98 %] 98 % (05/10 0618)  Intake/Output from previous day: 05/09 0701 - 05/10 0700 In: 347.9 [I.V.:347.9] Out: 200 [Urine:200] Intake/Output this shift: No intake/output data recorded.   Physical Exam  Constitutional: He is well-developed, well-nourished, and in no distress.  Cardiovascular: Normal rate.  Abdominal: Soft. He exhibits no distension.  Genitourinary: Penis normal.  Genitourinary Comments: Left scrotal abscess site without erythema.  Packing in place.    Skin: Skin is warm and dry.    Lab Results:   Recent Labs  04/29/17 0001 04/30/17 0543  WBC 9.9 10.8*  HGB 9.5* 10.6*  HCT 27.5* 31.0*  PLT 213 316   BMET  Recent Labs  04/29/17 0001 04/30/17 0543  NA 135 135  K 3.8 4.6  CL 100* 105  CO2 23 20*  GLUCOSE 128* 109*  BUN 59* 53*  CREATININE 3.53* 3.14*  CALCIUM 8.1* 8.9    Studies/Results: US Renal  Result Date: 04/29/2017 CLINICAL DATA:  Acute renal failure, microscopic hematuria EXAM: RENAL / URINARY TRACT ULTRASOUND COMPLETE COMPARISON:  Renal ultrasound of October 08, 2015  FINDINGS: Right Kidney: Length: 13.5 cm. The renal cortical echotexture is mildly increased but remains lower than that of the adjacent liver. There is no hydronephrosis. There is no focal mass. Left Kidney: Length: 12.0 cm. The renal cortical echotexture is mildly increased similar to that on the right. There is no hydronephrosis. Bladder: Appears normal for degree of bladder distention. Partially imaged is a right lower lobe hepatic cyst. This is been previously demonstrated. IMPRESSION: No hydronephrosis nor renal or bladder mass is observed. The renal cortical echotexture is mildly increased consistent with medical renal disease. This has been previously described. Electronically Signed   By: David  Martinique M.D.   On: 04/29/2017 08:47     Assessment: s/p Procedure(s): IRRIGATION AND DEBRIDEMENT EXTREMITY IRRIGATION AND DEBRIDEMENT Scrotal ABSCESS  Plan: -agree with current abx, adjust based on culture results -daily wound packing, will likely only be possible for a few days given small size of wound -f/u as outpatient for cystoscopy (hematuria work up), wound check -urology will sign off, please page with questions or concerns    LOS: 3 days    Jonathon Snow 04/30/2017

## 2017-04-30 NOTE — Progress Notes (Signed)
[facility], [place] 04/30/17  Subjective:   No new complaints Underwent axillary abscess dranage and scrotal abscess dranage / 04/29/17 S Creatinine is slightly better today   Objective:  Vital signs in last 24 hours:  Temp:  [97.3 F (36.3 C)-98.6 F (37 C)] 97.8 F (36.6 C) (05/10 0618) Pulse Rate:  [72-92] 77 (05/10 1213) Resp:  [12-20] 14 (05/10 0618) BP: (125-162)/(68-95) 138/78 (05/10 1213) SpO2:  [97 %-100 %] 98 % (05/10 0618)  Weight change:  Filed Weights   04/27/17 1056 04/29/17 1146  Weight: 107 kg (236 lb) 107 kg (236 lb)    Intake/Output:    Intake/Output Summary (Last 24 hours) at 04/30/17 1230 Last data filed at 04/30/17 0618  Gross per 24 hour  Intake              300 ml  Output                0 ml  Net              300 ml     Physical Exam: General: No acute distress, lying in the bed  HEENT Anicteric, moist oral mucous membranes  Neck supple  Pulm/lungs Normal breathing effort, clear to auscultation  CVS/Heart Regular, no rub or gallop  Abdomen:  Soft, nontender, nondistended  Extremities: Trace edema  Neurologic: Alert, oriented              Basic Metabolic Panel:   Recent Labs Lab 04/27/17 1055 04/28/17 0406 04/29/17 0001 04/30/17 0543  NA 131* 134* 135 135  K 3.9 4.0 3.8 4.6  CL 97* 102 100* 105  CO2 19* 23 23 20*  GLUCOSE 125* 111* 128* 109*  BUN 46* 51* 59* 53*  CREATININE 3.71* 3.76* 3.53* 3.14*  CALCIUM 8.7* 7.9* 8.1* 8.9     CBC:  Recent Labs Lab 04/27/17 1055 04/28/17 0406 04/29/17 0001 04/30/17 0543  WBC 15.7* 10.8* 9.9 10.8*  HGB 11.4* 9.8* 9.5* 10.6*  HCT 34.3* 28.1* 27.5* 31.0*  MCV 88.0 89.1 89.6 88.0  PLT 230 213 213 316     No results found for: HEPBSAG, HEPBSAB, HEPBIGM    Microbiology:  Recent Results (from the past 240 hour(s))  Blood Culture (routine x 2)     Status: None (Preliminary result)   Collection Time: 04/27/17  3:58 PM  Result Value Ref Range  Status   Specimen Description BLOOD LEFT HAND  Final   Special Requests   Final    BOTTLES DRAWN AEROBIC AND ANAEROBIC Blood Culture adequate volume   Culture NO GROWTH 3 DAYS  Final   Report Status PENDING  Incomplete  Aerobic/Anaerobic Culture (surgical/deep wound)     Status: None (Preliminary result)   Collection Time: 04/27/17  3:59 PM  Result Value Ref Range Status   Specimen Description WOUND  Final   Special Requests RIGHT AXILLA  Final   Gram Stain   Final    ABUNDANT WBC PRESENT, PREDOMINANTLY MONONUCLEAR ABUNDANT GRAM POSITIVE COCCI IN CLUSTERS    Culture   Final    ABUNDANT STAPHYLOCOCCUS AUREUS SUSCEPTIBILITIES TO FOLLOW Performed at [facility], [address], [place], [place] [address]    Report Status PENDING  Incomplete  Blood Culture (routine x 2)     Status: Abnormal   Collection Time: 04/27/17  4:15 PM  Result Value Ref Range Status   Specimen Description BLOOD LEFT HAND  Final   Special Requests   Final  BOTTLES DRAWN AEROBIC AND ANAEROBIC Blood Culture adequate volume   Culture  Setup Time   Final    GRAM POSITIVE RODS AEROBIC BOTTLE ONLY RESULT CALLED TO, READ BACK BY AND VERIFIED WITH: CHRISTINE KATSOUDAS AT 5284 ON 04/28/17 [place].    Culture (A)  Final    BACILLUS SPECIES Standardized susceptibility testing for this organism is not available. Performed at [facility], [place] [address] [place], [place] [address]    Report Status 04/29/2017 FINAL  Final  Blood Culture ID Panel (Reflexed)     Status: None   Collection Time: 04/27/17  4:15 PM  Result Value Ref Range Status   Enterococcus species NOT DETECTED NOT DETECTED Final   Listeria monocytogenes NOT DETECTED NOT DETECTED Final   Staphylococcus species NOT DETECTED NOT DETECTED Final   Staphylococcus aureus NOT DETECTED NOT DETECTED Final   Streptococcus species NOT DETECTED NOT DETECTED Final   Streptococcus agalactiae NOT DETECTED NOT DETECTED Final   Streptococcus pneumoniae NOT  DETECTED NOT DETECTED Final   Streptococcus pyogenes NOT DETECTED NOT DETECTED Final   Acinetobacter baumannii NOT DETECTED NOT DETECTED Final   Enterobacteriaceae species NOT DETECTED NOT DETECTED Final   Enterobacter cloacae complex NOT DETECTED NOT DETECTED Final   Escherichia coli NOT DETECTED NOT DETECTED Final   Klebsiella oxytoca NOT DETECTED NOT DETECTED Final   Klebsiella pneumoniae NOT DETECTED NOT DETECTED Final   Proteus species NOT DETECTED NOT DETECTED Final   Serratia marcescens NOT DETECTED NOT DETECTED Final   Haemophilus influenzae NOT DETECTED NOT DETECTED Final   Neisseria meningitidis NOT DETECTED NOT DETECTED Final   Pseudomonas aeruginosa NOT DETECTED NOT DETECTED Final   Candida albicans NOT DETECTED NOT DETECTED Final   Candida glabrata NOT DETECTED NOT DETECTED Final   Candida krusei NOT DETECTED NOT DETECTED Final   Candida parapsilosis NOT DETECTED NOT DETECTED Final   Candida tropicalis NOT DETECTED NOT DETECTED Final  Urine culture     Status: None   Collection Time: 04/28/17 10:35 AM  Result Value Ref Range Status   Specimen Description URINE, RANDOM  Final   Special Requests NONE  Final   Culture   Final    NO GROWTH Performed at [facility], [address] [place], [place] [address]    Report Status 04/29/2017 FINAL  Final  MRSA PCR Screening     Status: Abnormal   Collection Time: 04/29/17 11:10 AM  Result Value Ref Range Status   MRSA by PCR POSITIVE (A) NEGATIVE Final    Comment:        The GeneXpert MRSA Assay (FDA approved for NASAL specimens only), is one component of a comprehensive MRSA colonization surveillance program. It is not intended to diagnose MRSA infection nor to guide or monitor treatment for MRSA infections. RESULT CALLED TO, READ BACK BY AND VERIFIED WITH:  JEANNA DENTON AT 1244 04/29/17 SDR   Aerobic/Anaerobic Culture (surgical/deep wound)     Status: None (Preliminary result)   Collection Time: 04/29/17  2:22  PM  Result Value Ref Range Status   Specimen Description ABSCESS RIGHT AXILLA  Final   Special Requests SAMPLE A  Final   Gram Stain   Final    FEW WBC PRESENT, PREDOMINANTLY PMN MODERATE GRAM POSITIVE COCCI IN CLUSTERS    Culture   Final    TOO YOUNG TO READ Performed at [facility], [place] [address] [place], [place] [address]    Report Status PENDING  Incomplete  Aerobic/Anaerobic  Culture (surgical/deep wound)     Status: None (Preliminary result)   Collection Time: 04/29/17  2:22 PM  Result Value Ref Range Status   Specimen Description ABSCESS LEFT GROIN  Final   Special Requests SAMPLE B  Final   Gram Stain   Final    FEW WBC PRESENT, PREDOMINANTLY PMN FEW GRAM POSITIVE COCCI IN CLUSTERS    Culture   Final    TOO YOUNG TO READ Performed at [facility], [place] [address] [place], [place] [address]    Report Status PENDING  Incomplete    Coagulation Studies:  Recent Labs  04/27/17 1559  LABPROT 19.7*  INR 1.65    Urinalysis:  Recent Labs  04/28/17 1035  COLORURINE YELLOW*  LABSPEC 1.008  PHURINE 5.0  GLUCOSEU NEGATIVE  HGBUR LARGE*  BILIRUBINUR NEGATIVE  KETONESUR NEGATIVE  PROTEINUR 30*  NITRITE NEGATIVE  LEUKOCYTESUR NEGATIVE      Imaging: US Renal  Result Date: 04/29/2017 CLINICAL DATA:  Acute renal failure, microscopic hematuria EXAM: RENAL / URINARY TRACT ULTRASOUND COMPLETE COMPARISON:  Renal ultrasound of October 08, 2015 FINDINGS: Right Kidney: Length: 13.5 cm. The renal cortical echotexture is mildly increased but remains lower than that of the adjacent liver. There is no hydronephrosis. There is no focal mass. Left Kidney: Length: 12.0 cm. The renal cortical echotexture is mildly increased similar to that on the right. There is no hydronephrosis. Bladder: Appears normal for degree of bladder distention. Partially imaged is a right lower lobe hepatic cyst. This is been previously demonstrated. IMPRESSION: No hydronephrosis nor renal or  bladder mass is observed. The renal cortical echotexture is mildly increased consistent with medical renal disease. This has been previously described. Electronically Signed   By: David  Martinique M.D.   On: 04/29/2017 08:47     Medications:   . piperacillin-tazobactam (ZOSYN)  IV 3.375 g (04/30/17 0843)  . vancomycin Stopped (04/30/17 0059)   . apixaban  5 mg Oral BID  . Chlorhexidine Gluconate Cloth  6 each Topical Q0600  . losartan  100 mg Oral Daily  . mupirocin ointment  1 application Nasal BID  . mycophenolate  1,000 mg Oral BID  . prednisoLONE  10 mg Oral Daily  . rosuvastatin  10 mg Oral QHS   diphenhydrAMINE, docusate sodium, ipratropium-albuterol, morphine injection, traMADol  Assessment/ Plan:  56 y.o.  male with past history of pulmonary embolus, history of IVC filter placement, right ankle fracture status post ORIF, and herniated disc, lupus nephritis, proteinuria, CKD 3, hypertension  1.  Acute renal failure on chronic kidney disease stage III 2.  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  Relapse February 2018 3.  Right axillary abscess 4.  Hematuria and proteinuria   Urine protein to creatinine ratio 0.73.  Complement levels are normal. Patient has had long-standing hematuria. He is currently on anticoagulation which may be playing a role  Acute worsening of renal failure appears to be secondary to concurrent abscess/infection in the right axillary area.  Patient is being treated with broad-spectrum antibiotics.  Surgery team is following along. Underwent debridement  04/29/17   Continue prednisone, cellcept, losartan       LOS: 3 Jonathon Snow 5/10/201812:30 PM  [facility] [place], [place]

## 2017-04-30 NOTE — Progress Notes (Signed)
1 Day Post-Op   Subjective:  Patient underwent drainage of a right axillary abscess yesterday. States the area feels much better today than yesterday. Continues to have some drainage but not nearly the same amount.  Vital signs in last 24 hours: Temp:  [97.2 F (36.2 C)-98.6 F (37 C)] 97.8 F (36.6 C) (05/10 0618) Pulse Rate:  [72-92] 73 (05/10 0845) Resp:  [12-20] 14 (05/10 0618) BP: (125-162)/(68-95) 162/87 (05/10 0845) SpO2:  [97 %-100 %] 98 % (05/10 0618) Weight:  [107 kg (236 lb)] 107 kg (236 lb) (05/09 1146) Last BM Date: 04/28/17 (per patient)  Intake/Output from previous day: 05/09 0701 - 05/10 0700 In: 347.9 [I.V.:347.9] Out: 200 [Urine:200]  Physical exam: Gen.: No acute distress Chest: Clear to auscultation Heart: Regular rhythm GI: soft, non-tender; bowel sounds normal; no masses,  no organomegaly  Skin: Right axilla with Penrose drain in place. Minimal seropurulent drainage on the dressing. Expressible output.  Lab Results:  CBC  Recent Labs  04/29/17 0001 04/30/17 0543  WBC 9.9 10.8*  HGB 9.5* 10.6*  HCT 27.5* 31.0*  PLT 213 316   CMP     Component Value Date/Time   NA 135 04/30/2017 0543   NA 141 04/17/2016 0924   NA 138 04/10/2015 1036   K 4.6 04/30/2017 0543   K 3.5 04/10/2015 1036   CL 105 04/30/2017 0543   CL 108 04/10/2015 1036   CO2 20 (L) 04/30/2017 0543   CO2 25 04/10/2015 1036   GLUCOSE 109 (H) 04/30/2017 0543   GLUCOSE 96 04/10/2015 1036   BUN 53 (H) 04/30/2017 0543   BUN 23 04/17/2016 0924   BUN 16 04/10/2015 1036   CREATININE 3.14 (H) 04/30/2017 0543   CREATININE 2.29 (H) 03/25/2017 0857   CALCIUM 8.9 04/30/2017 0543   CALCIUM 8.4 (L) 04/10/2015 1036   PROT 6.7 04/27/2017 1559   PROT 6.9 04/17/2016 0924   PROT 7.0 04/10/2015 1036   ALBUMIN 2.9 (L) 04/27/2017 1559   ALBUMIN 3.9 04/17/2016 0924   ALBUMIN 3.9 04/10/2015 1036   AST 18 04/27/2017 1559   AST 19 04/10/2015 1036   ALT 22 04/27/2017 1559   ALT 16 (L) 04/10/2015  1036   ALKPHOS 57 04/27/2017 1559   ALKPHOS 71 04/10/2015 1036   BILITOT 1.0 04/27/2017 1559   BILITOT 0.4 04/17/2016 0924   BILITOT 0.7 04/10/2015 1036   GFRNONAA 21 (L) 04/30/2017 0543   GFRNONAA 31 (L) 03/25/2017 0857   GFRAA 24 (L) 04/30/2017 0543   GFRAA 36 (L) 03/25/2017 0857   PT/INR  Recent Labs  04/27/17 1559  LABPROT 19.7*  INR 1.65    Studies/Results: US Renal  Result Date: 04/29/2017 CLINICAL DATA:  Acute renal failure, microscopic hematuria EXAM: RENAL / URINARY TRACT ULTRASOUND COMPLETE COMPARISON:  Renal ultrasound of October 08, 2015 FINDINGS: Right Kidney: Length: 13.5 cm. The renal cortical echotexture is mildly increased but remains lower than that of the adjacent liver. There is no hydronephrosis. There is no focal mass. Left Kidney: Length: 12.0 cm. The renal cortical echotexture is mildly increased similar to that on the right. There is no hydronephrosis. Bladder: Appears normal for degree of bladder distention. Partially imaged is a right lower lobe hepatic cyst. This is been previously demonstrated. IMPRESSION: No hydronephrosis nor renal or bladder mass is observed. The renal cortical echotexture is mildly increased consistent with medical renal disease. This has been previously described. Electronically Signed   By: David  Martinique M.D.   On: 04/29/2017 08:47  Assessment/Plan: 56 year old male status post I&D of right axillary abscess. Also status post I&D of scrotal abscess by urology. Doing well. Cultures obtained in the OR yesterday. Continue local wound care. Continue care of other medical problems per the primary team. Once able to be transitioned to oral antibiotics would be okay for discharge home per the surgical service. Surgery will continue to follow with you.   Clayburn Pert, MD [facility]  [facility] [phone] Night [facility] [phone]  04/30/2017

## 2017-04-30 NOTE — Progress Notes (Signed)
[facility] [place] NAME: Jonathon Snow    MR#:  [id number]  DATE OF BIRTH:  February 05, 1961  SUBJECTIVE:  CHIEF COMPLAINT:  Pt had Axillary and scrotal abscess drainage done on 04/29/2017. Resting comfortably today  REVIEW OF SYSTEMS:  CONSTITUTIONAL: No fever, fatigue or weakness.  EYES: No blurred or double vision.  EARS, NOSE, AND THROAT: No tinnitus or ear pain.  RESPIRATORY: No cough,Slightly improved shortness of breath, no wheezing or hemoptysis.  CARDIOVASCULAR: No chest pain, orthopnea, edema.  GASTROINTESTINAL: No nausea, vomiting, diarrhea or abdominal pain.  GENITOURINARY: No dysuria, hematuria.  ENDOCRINE: No polyuria, nocturia,  HEMATOLOGY: No anemia, easy bruising or bleeding SKIN: Status post drainage of the axillary abscess and scrotal abscess MUSCULOSKELETAL: No joint pain or arthritis.   NEUROLOGIC: No tingling, numbness, weakness.  PSYCHIATRY: No anxiety or depression.   DRUG ALLERGIES:   Allergies  Allergen Reactions  . Vicodin [Hydrocodone-Acetaminophen] Hives and Other (See Comments)    Rash/hives Other Reaction: severe headaches Rash/hives Other Reaction: severe headaches Rash/hives  . Hydrocodone Rash  . Oxycodone Rash    Takes occasionally.    VITALS:  Blood pressure 138/78, pulse 77, temperature 97.8 F (36.6 C), temperature source Oral, resp. rate 14, height 6\' 2"  (1.88 m), weight 107 kg (236 lb), SpO2 98 %.  PHYSICAL EXAMINATION:  GENERAL:  56 y.o.-year-old patient lying in the bed with no acute distress.  EYES: Pupils equal, round, reactive to light and accommodation. No scleral icterus. Extraocular muscles intact.  HEENT: Head atraumatic, normocephalic. Oropharynx and nasopharynx clear.  NECK:  Supple, no jugular venous distention. No thyroid enlargement, no tenderness.  LUNGS: Normal breath sounds bilaterally, no wheezing, rales,rhonchi or crepitation. No use of accessory muscles of  respiration.  CARDIOVASCULAR: S1, S2 normal. No murmurs, rubs, or gallops.  ABDOMEN: Soft, nontender, nondistended. Bowel sounds present. Scrotum with small abscess, fluctuant and tender( examined patient in the presence of RN) EXTREMITIES: No pedal edema, cyanosis, or clubbing.  NEUROLOGIC: Cranial nerves II through XII are intact. Muscle strength 5/5 in all extremities. Sensation intact. Gait not checked.  PSYCHIATRIC: The patient is alert and oriented x 3.  SKIN: Right axillary abscess status post drainage clean dressing. Scrotal abscess status post drainage and some bloody discharge on the dressing   LABORATORY PANEL:   CBC  Recent Labs Lab 04/30/17 0543  WBC 10.8*  HGB 10.6*  HCT 31.0*  PLT 316   ------------------------------------------------------------------------------------------------------------------  Chemistries   Recent Labs Lab 04/27/17 1559  04/30/17 0543  NA  --   < > 135  K  --   < > 4.6  CL  --   < > 105  CO2  --   < > 20*  GLUCOSE  --   < > 109*  BUN  --   < > 53*  CREATININE  --   < > 3.14*  CALCIUM  --   < > 8.9  AST 18  --   --   ALT 22  --   --   ALKPHOS 57  --   --   BILITOT 1.0  --   --   < > = values in this interval not displayed. ------------------------------------------------------------------------------------------------------------------  Cardiac Enzymes  Recent Labs Lab 04/27/17 1559  TROPONINI 0.03*   ------------------------------------------------------------------------------------------------------------------  RADIOLOGY:  US Renal  Result Date: 04/29/2017 CLINICAL DATA:  Acute renal failure, microscopic hematuria EXAM: RENAL / URINARY TRACT ULTRASOUND COMPLETE COMPARISON:  Renal ultrasound of October 08, 2015 FINDINGS: Right Kidney: Length: 13.5 cm. The renal cortical echotexture is mildly increased but remains lower than that of the adjacent liver. There is no hydronephrosis. There is no focal mass. Left Kidney:  Length: 12.0 cm. The renal cortical echotexture is mildly increased similar to that on the right. There is no hydronephrosis. Bladder: Appears normal for degree of bladder distention. Partially imaged is a right lower lobe hepatic cyst. This is been previously demonstrated. IMPRESSION: No hydronephrosis nor renal or bladder mass is observed. The renal cortical echotexture is mildly increased consistent with medical renal disease. This has been previously described. Electronically Signed   By: David  Martinique M.D.   On: 04/29/2017 08:47    EKG:   Orders placed or performed during the hospital encounter of 04/27/17  . ED EKG  . ED EKG  . EKG 12-Lead  . EKG 12-Lead    ASSESSMENT AND PLAN:   * Acute on chronic renal failure -3 . History of lupus nephritis   worsening of renal function could be from right axillary abscess. No clinically improving On IV antibiotics   Currently he is on steroids 10 mg oral prednisone daily and CellCept, continue the same    follow-up with nephrology. Complement levels pending Cr 3.76-3.53--3.1  *Chronic hematuria- on anticoagulation heparin gtt currently, resumed Eliquis  Follow-up with urology, d/w dr.Brandon  * Right axillary abscess Pt had incision and drainage by surgery 04/29/17. Wound culture with the gram-positive cocci in clusters Continue IV antibiotics Zosyn and vancomycin.  * scrotal abscess-Urology is consulted. Patient had incision and drainage of the scrotal abscess on 04/29/2017. Scrotal abscess culture with gram-positive cocci in clusters continue current IV antibiotics  * Lupus   .Currently he is on steroids 10 mg oral prednisone daily and CellCept, continue the same.  * History of DVT and PE   Continue anticoagulation with Eliquis  * Hypertension   Continue losartan.     All the records are reviewed and case discussed with Care Management/Social Workerr. Management plans discussed with the patient, family and they are in  agreement.  CODE STATUS: fc   TOTAL TIME TAKING CARE OF THIS PATIENT: 35  minutes.   POSSIBLE D/C IN 2-3  DAYS, DEPENDING ON CLINICAL CONDITION.  Note: This dictation was prepared with Dragon dictation along with smaller phrase technology. Any transcriptional errors that result from this process are unintentional.   [name] M.D on 04/30/2017 at 1:00 PM  Between 7am to 6pm - Pager - [phone] After 6pm go to [email or web address] - password [email or web address] [facility]  [facility]  Office  [phone]  CC: Primary care physician; Roselee Nova, MD

## 2017-05-01 ENCOUNTER — Telehealth: Payer: Self-pay | Admitting: Urology

## 2017-05-01 LAB — CBC WITH DIFFERENTIAL/PLATELET
Basophils Absolute: 0 10*3/uL (ref 0–0.1)
Basophils Relative: 0 %
Eosinophils Absolute: 0 10*3/uL (ref 0–0.7)
Eosinophils Relative: 1 %
HEMATOCRIT: 28.9 % — AB (ref 40.0–52.0)
HEMOGLOBIN: 9.8 g/dL — AB (ref 13.0–18.0)
LYMPHS ABS: 1.1 10*3/uL (ref 1.0–3.6)
Lymphocytes Relative: 12 %
MCH: 30.1 pg (ref 26.0–34.0)
MCHC: 33.8 g/dL (ref 32.0–36.0)
MCV: 89.1 fL (ref 80.0–100.0)
MONOS PCT: 6 %
Monocytes Absolute: 0.5 10*3/uL (ref 0.2–1.0)
NEUTROS ABS: 7.4 10*3/uL — AB (ref 1.4–6.5)
NEUTROS PCT: 81 %
Platelets: 304 10*3/uL (ref 150–440)
RBC: 3.25 MIL/uL — ABNORMAL LOW (ref 4.40–5.90)
RDW: 13.6 % (ref 11.5–14.5)
WBC: 9 10*3/uL (ref 3.8–10.6)

## 2017-05-01 LAB — BASIC METABOLIC PANEL
Anion gap: 10 (ref 5–15)
BUN: 64 mg/dL — ABNORMAL HIGH (ref 6–20)
CHLORIDE: 101 mmol/L (ref 101–111)
CO2: 23 mmol/L (ref 22–32)
CREATININE: 3.61 mg/dL — AB (ref 0.61–1.24)
Calcium: 8.5 mg/dL — ABNORMAL LOW (ref 8.9–10.3)
GFR calc Af Amer: 20 mL/min — ABNORMAL LOW (ref >60)
GFR calc non Af Amer: 18 mL/min — ABNORMAL LOW (ref >60)
Glucose, Bld: 119 mg/dL — ABNORMAL HIGH (ref 65–99)
Potassium: 4.1 mmol/L (ref 3.5–5.1)
Sodium: 134 mmol/L — ABNORMAL LOW (ref 135–145)

## 2017-05-01 LAB — VANCOMYCIN, TROUGH: Vancomycin Tr: 66 ug/mL (ref 15–20)

## 2017-05-01 MED ORDER — PROMETHAZINE HCL 25 MG/ML IJ SOLN
12.5000 mg | Freq: Once | INTRAMUSCULAR | Status: AC
  Administered 2017-05-01: 12.5 mg via INTRAVENOUS
  Filled 2017-05-01: qty 1

## 2017-05-01 MED ORDER — DOCUSATE SODIUM 100 MG PO CAPS
100.0000 mg | ORAL_CAPSULE | Freq: Every day | ORAL | Status: DC
  Administered 2017-05-02: 100 mg via ORAL
  Filled 2017-05-01: qty 1

## 2017-05-01 MED ORDER — ONDANSETRON HCL 4 MG/2ML IJ SOLN
4.0000 mg | Freq: Four times a day (QID) | INTRAMUSCULAR | Status: DC | PRN
  Administered 2017-05-01: 4 mg via INTRAVENOUS
  Filled 2017-05-01: qty 2

## 2017-05-01 MED ORDER — POLYETHYLENE GLYCOL 3350 17 G PO PACK
17.0000 g | PACK | Freq: Every day | ORAL | Status: DC
  Administered 2017-05-01 – 2017-05-02 (×2): 17 g via ORAL
  Filled 2017-05-01 (×3): qty 1

## 2017-05-01 MED ORDER — DOXYCYCLINE HYCLATE 100 MG PO TABS
100.0000 mg | ORAL_TABLET | Freq: Two times a day (BID) | ORAL | Status: DC
  Administered 2017-05-01 – 2017-05-03 (×4): 100 mg via ORAL
  Filled 2017-05-01 (×4): qty 1

## 2017-05-01 MED ORDER — BISACODYL 5 MG PO TBEC
10.0000 mg | DELAYED_RELEASE_TABLET | Freq: Every day | ORAL | Status: DC | PRN
  Administered 2017-05-01: 10 mg via ORAL
  Filled 2017-05-01: qty 2

## 2017-05-01 NOTE — Progress Notes (Signed)
Order given for dulcolax by Dr Margaretmary Eddy

## 2017-05-01 NOTE — Progress Notes (Signed)
Pharmacy Antibiotic Note  NIMA KEMPPAINEN is a 56 y.o. male admitted on 04/27/2017 with sepsis.  Pharmacy has been consulted for Zosyn and vancomycin dosing.  Plan: 1. Zosyn 3.375 gm IV Q8H EI 2. Vancomycin 1 gm IV x 1 in ED followed in approximately 6 hours (stacked dosing) by vancomycin 1 gm IV Q24H, predicted trough 16 mcg/mL. Pharmacy will continue to follow and adjust as needed to maintain trough 15 to 20 mcg/ml.   5/10 @ 2345 VT 66 mcg/mL. Trough drawn while dose running, will retime for 5/11 @ 2200.  Vd 64.5 L, Ke 0.029 hr-1, T1/2 24.1 hr  Height: 6\' 2"  (188 cm) Weight: 236 lb (107 kg) IBW/kg (Calculated) : 82.2  Temp (24hrs), Avg:98.9 F (37.2 C), Min:98.9 F (37.2 C), Max:98.9 F (37.2 C)   Last Labs    Recent Labs Lab 04/27/17 1055  WBC 15.7*  CREATININE 3.71*      Estimated Creatinine Clearance: 29.3 mL/min (A) (by C-G formula based on SCr of 3.71 mg/dL (H)).         Allergies  Allergen Reactions  . Vicodin [Hydrocodone-Acetaminophen] Hives and Other (See Comments)    Rash/hives Other Reaction: severe headaches Rash/hives Other Reaction: severe headaches Rash/hives  . Hydrocodone Rash  . Oxycodone Rash    Takes occasionally.    Thank you for allowing pharmacy to be a part of this patient's care.  Tobie Lords, PharmD, BCPS Clinical Pharmacist 05/01/2017

## 2017-05-01 NOTE — Telephone Encounter (Signed)
-----   Message from Hollice Espy, MD sent at 04/30/2017  4:57 PM EDT ----- Regarding: outpatient scrotal wound check and cysto in 2-3 weeks This is an inpatient on [facility] who need the following followup up:   scrotal wound check and cysto in 2-3 weeks   Hollice Espy, MD

## 2017-05-01 NOTE — Progress Notes (Signed)
[facility], [place] 05/01/17  Subjective:   No new complaints Underwent axillary abscess dranage and scrotal abscess dranage / 04/29/17 S Creatinine is slightly worse again today Ate better today    Objective:  Vital signs in last 24 hours:  Temp:  [97.4 F (36.3 C)-98.5 F (36.9 C)] 98.5 F (36.9 C) (05/11 0623) Pulse Rate:  [67-71] 71 (05/11 0623) Resp:  [18] 18 (05/11 0623) BP: (129-134)/(62-85) 134/62 (05/11 1046) SpO2:  [97 %] 97 % (05/11 0623)  Weight change:  Filed Weights   04/27/17 1056 04/29/17 1146  Weight: 107 kg (236 lb) 107 kg (236 lb)    Intake/Output:    Intake/Output Summary (Last 24 hours) at 05/01/17 1522 Last data filed at 05/01/17 1343  Gross per 24 hour  Intake              730 ml  Output              400 ml  Net              330 ml     Physical Exam: General: No acute distress, lying in the bed  HEENT Anicteric, moist oral mucous membranes  Neck supple  Pulm/lungs Normal breathing effort, clear to auscultation  CVS/Heart Regular, no rub or gallop  Abdomen:  Soft, nontender, nondistended  Extremities: Trace edema  Neurologic: Alert, oriented              Basic Metabolic Panel:   Recent Labs Lab 04/27/17 1055 04/28/17 0406 04/29/17 0001 04/30/17 0543 05/01/17 0441  NA 131* 134* 135 135 134*  K 3.9 4.0 3.8 4.6 4.1  CL 97* 102 100* 105 101  CO2 19* 23 23 20* 23  GLUCOSE 125* 111* 128* 109* 119*  BUN 46* 51* 59* 53* 64*  CREATININE 3.71* 3.76* 3.53* 3.14* 3.61*  CALCIUM 8.7* 7.9* 8.1* 8.9 8.5*     CBC:  Recent Labs Lab 04/27/17 1055 04/28/17 0406 04/29/17 0001 04/30/17 0543 05/01/17 0441  WBC 15.7* 10.8* 9.9 10.8* 9.0  NEUTROABS  --   --   --   --  7.4*  HGB 11.4* 9.8* 9.5* 10.6* 9.8*  HCT 34.3* 28.1* 27.5* 31.0* 28.9*  MCV 88.0 89.1 89.6 88.0 89.1  PLT 230 213 213 316 304     No results found for: HEPBSAG, HEPBSAB, HEPBIGM    Microbiology:  Recent Results (from the past 240  hour(s))  Blood Culture (routine x 2)     Status: None (Preliminary result)   Collection Time: 04/27/17  3:58 PM  Result Value Ref Range Status   Specimen Description BLOOD LEFT HAND  Final   Special Requests   Final    BOTTLES DRAWN AEROBIC AND ANAEROBIC Blood Culture adequate volume   Culture NO GROWTH 4 DAYS  Final   Report Status PENDING  Incomplete  Aerobic/Anaerobic Culture (surgical/deep wound)     Status: None (Preliminary result)   Collection Time: 04/27/17  3:59 PM  Result Value Ref Range Status   Specimen Description WOUND  Final   Special Requests RIGHT AXILLA  Final   Gram Stain   Final    ABUNDANT WBC PRESENT, PREDOMINANTLY MONONUCLEAR ABUNDANT GRAM POSITIVE COCCI IN CLUSTERS Performed at [facility], [address], [place], [place] [address]    Culture   Final    ABUNDANT METHICILLIN RESISTANT STAPHYLOCOCCUS AUREUS NO ANAEROBES ISOLATED; CULTURE IN PROGRESS FOR 5 DAYS    Report Status PENDING  Incomplete  Organism ID, Bacteria METHICILLIN RESISTANT STAPHYLOCOCCUS AUREUS  Final      Susceptibility   Methicillin resistant staphylococcus aureus - MIC*    CIPROFLOXACIN >=8 RESISTANT Resistant     ERYTHROMYCIN >=8 RESISTANT Resistant     GENTAMICIN <=0.5 SENSITIVE Sensitive     OXACILLIN >=4 RESISTANT Resistant     TETRACYCLINE <=1 SENSITIVE Sensitive     VANCOMYCIN 1 SENSITIVE Sensitive     TRIMETH/SULFA <=10 SENSITIVE Sensitive     CLINDAMYCIN <=0.25 SENSITIVE Sensitive     RIFAMPIN <=0.5 SENSITIVE Sensitive     Inducible Clindamycin NEGATIVE Sensitive     * ABUNDANT METHICILLIN RESISTANT STAPHYLOCOCCUS AUREUS  Blood Culture (routine x 2)     Status: Abnormal   Collection Time: 04/27/17  4:15 PM  Result Value Ref Range Status   Specimen Description BLOOD LEFT HAND  Final   Special Requests   Final    BOTTLES DRAWN AEROBIC AND ANAEROBIC Blood Culture adequate volume   Culture  Setup Time   Final    GRAM POSITIVE RODS AEROBIC BOTTLE ONLY RESULT CALLED  TO, READ BACK BY AND VERIFIED WITH: CHRISTINE KATSOUDAS AT 6659 ON 04/28/17 [place].    Culture (A)  Final    BACILLUS SPECIES Standardized susceptibility testing for this organism is not available. Performed at [facility], [place] [address], [place], [place] [address]    Report Status 04/29/2017 FINAL  Final  Blood Culture ID Panel (Reflexed)     Status: None   Collection Time: 04/27/17  4:15 PM  Result Value Ref Range Status   Enterococcus species NOT DETECTED NOT DETECTED Final   Listeria monocytogenes NOT DETECTED NOT DETECTED Final   Staphylococcus species NOT DETECTED NOT DETECTED Final   Staphylococcus aureus NOT DETECTED NOT DETECTED Final   Streptococcus species NOT DETECTED NOT DETECTED Final   Streptococcus agalactiae NOT DETECTED NOT DETECTED Final   Streptococcus pneumoniae NOT DETECTED NOT DETECTED Final   Streptococcus pyogenes NOT DETECTED NOT DETECTED Final   Acinetobacter baumannii NOT DETECTED NOT DETECTED Final   Enterobacteriaceae species NOT DETECTED NOT DETECTED Final   Enterobacter cloacae complex NOT DETECTED NOT DETECTED Final   Escherichia coli NOT DETECTED NOT DETECTED Final   Klebsiella oxytoca NOT DETECTED NOT DETECTED Final   Klebsiella pneumoniae NOT DETECTED NOT DETECTED Final   Proteus species NOT DETECTED NOT DETECTED Final   Serratia marcescens NOT DETECTED NOT DETECTED Final   Haemophilus influenzae NOT DETECTED NOT DETECTED Final   Neisseria meningitidis NOT DETECTED NOT DETECTED Final   Pseudomonas aeruginosa NOT DETECTED NOT DETECTED Final   Candida albicans NOT DETECTED NOT DETECTED Final   Candida glabrata NOT DETECTED NOT DETECTED Final   Candida krusei NOT DETECTED NOT DETECTED Final   Candida parapsilosis NOT DETECTED NOT DETECTED Final   Candida tropicalis NOT DETECTED NOT DETECTED Final  Urine culture     Status: None   Collection Time: 04/28/17 10:35 AM  Result Value Ref Range Status   Specimen Description URINE, RANDOM  Final    Special Requests NONE  Final   Culture   Final    NO GROWTH Performed at [facility], [address], [place],  [address]    Report Status 04/29/2017 FINAL  Final  MRSA PCR Screening     Status: Abnormal   Collection Time: 04/29/17 11:10 AM  Result Value Ref Range Status   MRSA by PCR POSITIVE (A) NEGATIVE Final    Comment:  The GeneXpert MRSA Assay (FDA approved for NASAL specimens only), is one component of a comprehensive MRSA colonization surveillance program. It is not intended to diagnose MRSA infection nor to guide or monitor treatment for MRSA infections. RESULT CALLED TO, READ BACK BY AND VERIFIED WITH:  JEANNA DENTON AT 1244 04/29/17 SDR   Aerobic/Anaerobic Culture (surgical/deep wound)     Status: None (Preliminary result)   Collection Time: 04/29/17  2:22 PM  Result Value Ref Range Status   Specimen Description ABSCESS RIGHT AXILLA  Final   Special Requests SAMPLE A  Final   Gram Stain   Final    FEW WBC PRESENT, PREDOMINANTLY PMN MODERATE GRAM POSITIVE COCCI IN CLUSTERS Performed at [facility], [address], [place], [place] [address]    Culture   Final    ABUNDANT STAPHYLOCOCCUS AUREUS SUSCEPTIBILITIES PERFORMED ON PREVIOUS CULTURE WITHIN THE LAST 5 DAYS. NO ANAEROBES ISOLATED; CULTURE IN PROGRESS FOR 5 DAYS    Report Status PENDING  Incomplete  Aerobic/Anaerobic Culture (surgical/deep wound)     Status: None (Preliminary result)   Collection Time: 04/29/17  2:22 PM  Result Value Ref Range Status   Specimen Description ABSCESS LEFT GROIN  Final   Special Requests SAMPLE B  Final   Gram Stain   Final    FEW WBC PRESENT, PREDOMINANTLY PMN FEW GRAM POSITIVE COCCI IN CLUSTERS Performed at [facility], [address] [place], [place] [address]    Culture   Final    FEW STAPHYLOCOCCUS AUREUS NO ANAEROBES ISOLATED; CULTURE IN PROGRESS FOR 5 DAYS    Report Status PENDING  Incomplete    Coagulation Studies: No results  for input(s): LABPROT, INR in the last 72 hours.  Urinalysis: No results for input(s): COLORURINE, LABSPEC, PHURINE, GLUCOSEU, HGBUR, BILIRUBINUR, KETONESUR, PROTEINUR, UROBILINOGEN, NITRITE, LEUKOCYTESUR in the last 72 hours.  Invalid input(s): APPERANCEUR    Imaging: No results found.   Medications:   . piperacillin-tazobactam (ZOSYN)  IV 3.375 g (05/01/17 1120)   . apixaban  5 mg Oral BID  . Chlorhexidine Gluconate Cloth  6 each Topical Q0600  . docusate sodium  100 mg Oral Daily  . doxycycline  100 mg Oral Q12H  . losartan  100 mg Oral Daily  . mupirocin ointment  1 application Nasal BID  . mycophenolate  1,000 mg Oral BID  . polyethylene glycol  17 g Oral Daily  . prednisoLONE  10 mg Oral Daily  . rosuvastatin  10 mg Oral QHS   bisacodyl, diphenhydrAMINE, docusate sodium, ipratropium-albuterol, morphine injection, ondansetron (ZOFRAN) IV, traMADol  Assessment/ Plan:  56 y.o.  male with past history of pulmonary embolus, history of IVC filter placement, right ankle fracture status post ORIF, and herniated disc, lupus nephritis, proteinuria, CKD 3, hypertension  1.  Acute renal failure on chronic kidney disease stage III 2.  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  Relapse February 2018 3.  Right axillary abscess- MRSA 4.  Hematuria and proteinuria   Urine protein to creatinine ratio 0.73.  Complement levels are normal. Patient has had long-standing hematuria. He is currently on anticoagulation which may be playing a role  Acute worsening of renal failure appears to be secondary to concurrent abscess/infection in the right axillary area.  Patient is being treated with broad-spectrum antibiotics.  Surgery team is following along. Underwent debridement  04/29/17 [place] vancomycin level   Continue prednisone, cellcept, losartan       LOS:  4 Havah Ammon 5/11/20183:22 PM  [facility] [place],  [place]

## 2017-05-01 NOTE — Progress Notes (Signed)
2 Days Post-Op   Subjective:  Patient reports continued drainage from his right axillary abscess. He states he continues to feel better.  Vital signs in last 24 hours: Temp:  [97.4 F (36.3 C)-98.5 F (36.9 C)] 98.5 F (36.9 C) (05/11 0623) Pulse Rate:  [67-71] 71 (05/11 0623) Resp:  [18] 18 (05/11 0623) BP: (129-134)/(62-85) 134/62 (05/11 1046) SpO2:  [97 %] 97 % (05/11 0623) Last BM Date: 04/28/17  Intake/Output from previous day: 05/10 0701 - 05/11 0700 In: 650 [P.O.:400; IV Piggyback:250] Out: 250 [Urine:250]  Skin: Right axillary abscess drainage with Penrose drain in place. Minimal seropurulent drainage.  Lab Results:  CBC  Recent Labs  04/30/17 0543 05/01/17 0441  WBC 10.8* 9.0  HGB 10.6* 9.8*  HCT 31.0* 28.9*  PLT 316 304   CMP     Component Value Date/Time   NA 134 (L) 05/01/2017 0441   NA 141 04/17/2016 0924   NA 138 04/10/2015 1036   K 4.1 05/01/2017 0441   K 3.5 04/10/2015 1036   CL 101 05/01/2017 0441   CL 108 04/10/2015 1036   CO2 23 05/01/2017 0441   CO2 25 04/10/2015 1036   GLUCOSE 119 (H) 05/01/2017 0441   GLUCOSE 96 04/10/2015 1036   BUN 64 (H) 05/01/2017 0441   BUN 23 04/17/2016 0924   BUN 16 04/10/2015 1036   CREATININE 3.61 (H) 05/01/2017 0441   CREATININE 2.29 (H) 03/25/2017 0857   CALCIUM 8.5 (L) 05/01/2017 0441   CALCIUM 8.4 (L) 04/10/2015 1036   PROT 6.7 04/27/2017 1559   PROT 6.9 04/17/2016 0924   PROT 7.0 04/10/2015 1036   ALBUMIN 2.9 (L) 04/27/2017 1559   ALBUMIN 3.9 04/17/2016 0924   ALBUMIN 3.9 04/10/2015 1036   AST 18 04/27/2017 1559   AST 19 04/10/2015 1036   ALT 22 04/27/2017 1559   ALT 16 (L) 04/10/2015 1036   ALKPHOS 57 04/27/2017 1559   ALKPHOS 71 04/10/2015 1036   BILITOT 1.0 04/27/2017 1559   BILITOT 0.4 04/17/2016 0924   BILITOT 0.7 04/10/2015 1036   GFRNONAA 18 (L) 05/01/2017 0441   GFRNONAA 31 (L) 03/25/2017 0857   GFRAA 20 (L) 05/01/2017 0441   GFRAA 36 (L) 03/25/2017 0857   PT/INR No results for  input(s): LABPROT, INR in the last 72 hours.  Studies/Results: No results found.  Assessment/Plan: 56 year old male with multiple medical problems. Status post right axillary abscess drainage. Discussed with the patient once he can be transitioned to oral antibiotics that it would be okay for discharge from a surgical standpoint. Would need follow-up in the surgery clinic in 1 week for drain removal.   Clayburn Pert, MD [facility]  Day [facility] [id number] Night [facility] [id number]  05/01/2017

## 2017-05-01 NOTE — Progress Notes (Addendum)
[facility] NAME: Jonathon Snow    MR#:  [id number]  DATE OF BIRTH:  [birth year]  SUBJECTIVE:  CHIEF COMPLAINT:  Pt had Axillary and scrotal abscess drainage done on 04/29/2017.nauseous and vomited once. constipated  REVIEW OF SYSTEMS:  CONSTITUTIONAL: No fever, fatigue or weakness.  EYES: No blurred or double vision.  EARS, NOSE, AND THROAT: No tinnitus or ear pain.  RESPIRATORY: No cough,Slightly improved shortness of breath, no wheezing or hemoptysis.  CARDIOVASCULAR: No chest pain, orthopnea, edema.  GASTROINTESTINAL: No nausea, vomiting, diarrhea or abdominal pain.  GENITOURINARY: No dysuria, hematuria.  ENDOCRINE: No polyuria, nocturia,  HEMATOLOGY: No anemia, easy bruising or bleeding SKIN: Status post drainage of the axillary abscess and scrotal abscess MUSCULOSKELETAL: No joint pain or arthritis.   NEUROLOGIC: No tingling, numbness, weakness.  PSYCHIATRY: No anxiety or depression.   DRUG ALLERGIES:   Allergies  Allergen Reactions  . Vicodin [Hydrocodone-Acetaminophen] Hives and Other (See Comments)    Rash/hives Other Reaction: severe headaches Rash/hives Other Reaction: severe headaches Rash/hives  . Hydrocodone Rash  . Oxycodone Rash    Takes occasionally.    VITALS:  Blood pressure 134/62, pulse 71, temperature 98.5 F (36.9 C), temperature source Oral, resp. rate 18, height 6\' 2"  (1.88 m), weight 107 kg (236 lb), SpO2 97 %.  PHYSICAL EXAMINATION:  GENERAL:  56 y.o.-year-old patient lying in the bed with no acute distress.  EYES: Pupils equal, round, reactive to light and accommodation. No scleral icterus. Extraocular muscles intact.  HEENT: Head atraumatic, normocephalic. Oropharynx and nasopharynx clear.  NECK:  Supple, no jugular venous distention. No thyroid enlargement, no tenderness.  LUNGS: Normal breath sounds bilaterally, no wheezing, rales,rhonchi or crepitation. No use of accessory  muscles of respiration.  CARDIOVASCULAR: S1, S2 normal. No murmurs, rubs, or gallops.  ABDOMEN: Soft, nontender, nondistended. Bowel sounds present. Scrotum with small abscess, fluctuant and tender( examined patient in the presence of RN) EXTREMITIES: No pedal edema, cyanosis, or clubbing.  NEUROLOGIC: Cranial nerves II through XII are intact. Muscle strength 5/5 in all extremities. Sensation intact. Gait not checked.  PSYCHIATRIC: The patient is alert and oriented x 3.  SKIN: Right axillary abscess status post drainage clean dressing. Scrotal abscess status post drainage and some bloody discharge on the dressing   LABORATORY PANEL:   CBC  Recent Labs Lab 05/01/17 0441  WBC 9.0  HGB 9.8*  HCT 28.9*  PLT 304   ------------------------------------------------------------------------------------------------------------------  Chemistries   Recent Labs Lab 04/27/17 1559  05/01/17 0441  NA  --   < > 134*  K  --   < > 4.1  CL  --   < > 101  CO2  --   < > 23  GLUCOSE  --   < > 119*  BUN  --   < > 64*  CREATININE  --   < > 3.61*  CALCIUM  --   < > 8.5*  AST 18  --   --   ALT 22  --   --   ALKPHOS 57  --   --   BILITOT 1.0  --   --   < > = values in this interval not displayed. ------------------------------------------------------------------------------------------------------------------  Cardiac Enzymes  Recent Labs Lab 04/27/17 1559  TROPONINI 0.03*   ------------------------------------------------------------------------------------------------------------------  RADIOLOGY:  No results found.  EKG:   Orders placed or performed during the hospital encounter of 04/27/17  . ED EKG  . ED EKG  .  EKG 12-Lead  . EKG 12-Lead    ASSESSMENT AND PLAN:   * Acute on chronic renal failure -3 . History of lupus nephritis   worsening of renal function could be from right axillary abscess. No clinically improving On IV antibiotics   Currently he is on steroids 10 mg  oral prednisone daily and CellCept, continue the same    follow-up with nephrology. Complement C3 at 126 and C4 21 Cr 3.76-3.53--3.1-3.7, vancomycin discontinued  *Chronic hematuria- on anticoagulation heparin gtt currently, resumed Eliquis  Follow-up with urology, d/w dr.Brandon  * Right axillary abscess Pt had incision and drainage by surgery 04/29/17. Wound culture with the gram-positive cocci in clusters-MRSA  IV antibiotics Zosyn and vancomycin were given initially. Discontinue vancomycin. We will continue IV Zosyn and patient is started on doxycycline, we will follow up on the final culture results Blood cultures with gram-positive rods bacillus species susceptibility is not available Okay to discharge patient from surgical standpoint. Patient needs outpatient follow-up with surgery in a week for drain removal  * scrotal abscess-Urology is consulted. Patient had incision and drainage of the scrotal abscess on 04/29/2017. Scrotal abscess culture with gram-positive cocci in clusters continue current IV antibiotics  * Lupus   .Currently he is on steroids 10 mg oral prednisone daily and CellCept, continue the same.  * History of DVT and PE   Continue anticoagulation with Eliquis  * Hypertension   Continue losartan.  * Constipation-laxatives   All the records are reviewed and case discussed with Care Management/Social Workerr. Management plans discussed with the patient, family and they are in agreement.  CODE STATUS: fc   TOTAL TIME TAKING CARE OF THIS PATIENT: 35  minutes.   POSSIBLE D/C IN 2-3  DAYS, DEPENDING ON CLINICAL CONDITION.  Note: This dictation was prepared with Dragon dictation along with smaller phrase technology. Any transcriptional errors that result from this process are unintentional.   [name] M.D on 05/01/2017 at 1:56 PM  Between 7am to 6pm - Pager - [phone] After 6pm go to [email or web address] - password [email or web address] [facility]  [facility]   Office  [phone]  CC: Primary care physician; Roselee Nova, MD

## 2017-05-01 NOTE — Telephone Encounter (Signed)
done

## 2017-05-01 NOTE — Progress Notes (Signed)
Pt. Vomited 200 cc with a green appearance, PRN zofran was given at 0356. Prime doctor was notified, new orders for 12.5 mg phenergan IV once. Will continue to monitor pt.   Jonathon Snow [employer]

## 2017-05-02 LAB — BASIC METABOLIC PANEL
ANION GAP: 12 (ref 5–15)
BUN: 68 mg/dL — ABNORMAL HIGH (ref 6–20)
CO2: 24 mmol/L (ref 22–32)
Calcium: 9 mg/dL (ref 8.9–10.3)
Chloride: 100 mmol/L — ABNORMAL LOW (ref 101–111)
Creatinine, Ser: 3.36 mg/dL — ABNORMAL HIGH (ref 0.61–1.24)
GFR calc Af Amer: 22 mL/min — ABNORMAL LOW (ref >60)
GFR, EST NON AFRICAN AMERICAN: 19 mL/min — AB (ref >60)
Glucose, Bld: 95 mg/dL (ref 65–99)
POTASSIUM: 4.6 mmol/L (ref 3.5–5.1)
SODIUM: 136 mmol/L (ref 135–145)

## 2017-05-02 LAB — CBC
HEMATOCRIT: 30.3 % — AB (ref 40.0–52.0)
HEMOGLOBIN: 10.4 g/dL — AB (ref 13.0–18.0)
MCH: 30.8 pg (ref 26.0–34.0)
MCHC: 34.4 g/dL (ref 32.0–36.0)
MCV: 89.5 fL (ref 80.0–100.0)
Platelets: 335 10*3/uL (ref 150–440)
RBC: 3.39 MIL/uL — ABNORMAL LOW (ref 4.40–5.90)
RDW: 13.4 % (ref 11.5–14.5)
WBC: 8.6 10*3/uL (ref 3.8–10.6)

## 2017-05-02 LAB — AEROBIC/ANAEROBIC CULTURE W GRAM STAIN (SURGICAL/DEEP WOUND)

## 2017-05-02 LAB — CULTURE, BLOOD (ROUTINE X 2)
Culture: NO GROWTH
Special Requests: ADEQUATE

## 2017-05-02 LAB — AEROBIC/ANAEROBIC CULTURE (SURGICAL/DEEP WOUND)

## 2017-05-02 NOTE — Evaluation (Signed)
Physical Therapy Evaluation Patient Details Name: Jonathon Snow MRN: [id number] DOB: [birth year] Today's Date: 05/02/2017   History of Present Illness  Patient is a pleasant 56 y/o male with history of Lupus, admitted for axillary and scrotal abscess. Also found to be in acute renal failure.   Clinical Impression  Patient admitted for axillary and scrotal abscess, with I&D performed. He has no acute mobility deficits present, however he relates to this therapist that he now quickly becomes short of breath and fatigued. His PCP has recommended exercise, but he does not have access to a facility currently. He suffered a PE in 2015 and has progressively had increasing shortness of breath, noted to have increased respiratory labor in this bout of ambulation. He would benefit from a conditioning program whether that be pulmonary rehab, outpatient PT, or a personal trainer/small group training to increase his ability to perform community mobility.     Follow Up Recommendations Outpatient PT-- generalized conditioning.     Equipment Recommendations       Recommendations for Other Services       Precautions / Restrictions Restrictions Weight Bearing Restrictions: No      Mobility  Bed Mobility               General bed mobility comments: Patient received in bedside recliner.   Transfers Overall transfer level: Independent               General transfer comment: Patient requires no assistance, perhaps increased use of UEs from baseline.   Ambulation/Gait Ambulation/Gait assistance: Modified independent (Device/Increase time) Ambulation Distance (Feet): 325 Feet   Gait Pattern/deviations: WFL(Within Functional Limits)   Gait velocity interpretation: Below normal speed for age/gender General Gait Details: Patient pushes IV pole, he has more labored breathing with fatigue, but otherwise is able to push IV pole around RN station with no drop in O2 sats.   Stairs             Wheelchair Mobility    Modified Rankin (Stroke Patients Only)       Balance Overall balance assessment: Independent;No apparent balance deficits (not formally assessed)                                           Pertinent Vitals/Pain Pain Assessment: No/denies pain    Home Living Family/patient expects to be discharged to:: Private residence Living Arrangements: Alone Available Help at Discharge: Family;Friend(s) Type of Home: House Home Access: Level entry     Home Layout: One level Home Equipment: None      Prior Function Level of Independence: Independent         Comments: Patient is a Scientist, 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      Hand Dominance        Extremity/Trunk Assessment   Upper Extremity Assessment Upper Extremity Assessment: Overall WFL for tasks assessed    Lower Extremity Assessment Lower Extremity Assessment: Overall WFL for tasks assessed       Communication   Communication: No difficulties  Cognition Arousal/Alertness: Awake/alert Behavior During Therapy: WFL for tasks assessed/performed Overall Cognitive Status: Within Functional Limits for tasks assessed                                        General Comments  Exercises     Assessment/Plan    PT Assessment Patient needs continued PT services  PT Problem List Decreased activity tolerance;Cardiopulmonary status limiting activity;Decreased strength       PT Treatment Interventions      PT Goals (Current goals can be found in the Care Plan section)  Acute Rehab PT Goals Patient Stated Goal: To increase his level of conditioning.  PT Goal Formulation: With patient Time For Goal Achievement: 05/16/17 Potential to Achieve Goals: Good    Frequency  (Does not need acute PT services)   Barriers to discharge        Co-evaluation               AM-PAC PT "6 Clicks" Daily Activity  Outcome Measure Difficulty  turning over in bed (including adjusting bedclothes, sheets and blankets)?: None Difficulty moving from lying on back to sitting on the side of the bed? : None Difficulty sitting down on and standing up from a chair with arms (e.g., wheelchair, bedside commode, etc,.)?: None Help needed moving to and from a bed to chair (including a wheelchair)?: None Help needed walking in hospital room?: None Help needed climbing 3-5 steps with a railing? : None 6 Click Score: 24    End of Session   Activity Tolerance: Patient tolerated treatment well Patient left: with call bell/phone within reach;in chair Nurse Communication: Mobility status PT Visit Diagnosis: Other (comment) (Generalized deconditioning)    Time: 0998-3382 PT Time Calculation (min) (ACUTE ONLY): 12 min   Charges:   PT Evaluation $PT Eval Low Complexity: 1 Procedure     PT G Codes:       Royce Macadamia PT, DPT, CSCS    05/02/2017, 2:46 PM

## 2017-05-02 NOTE — Progress Notes (Signed)
[facility], [place] 05/02/17  Subjective:   No new complaints Underwent axillary abscess dranage and scrotal abscess dranage / 04/29/17 S Creatinine is slightly improved to 3.36 Ate better today  No nausea/vomiting today   Objective:  Vital signs in last 24 hours:  Temp:  [97.4 F (36.3 C)-97.5 F (36.4 C)] 97.4 F (36.3 C) (05/12 0538) Pulse Rate:  [65-66] 65 (05/12 0538) Resp:  [18-20] 20 (05/12 0538) BP: (144-145)/(73-74) 144/73 (05/12 0538) SpO2:  [99 %-100 %] 99 % (05/12 0538)  Weight change:  Filed Weights   04/27/17 1056 04/29/17 1146  Weight: 107 kg (236 lb) 107 kg (236 lb)    Intake/Output:    Intake/Output Summary (Last 24 hours) at 05/02/17 1226 Last data filed at 05/02/17 0700  Gross per 24 hour  Intake              530 ml  Output              500 ml  Net               30 ml     Physical Exam: General: No acute distress, sitting up in chair  HEENT Anicteric, moist oral mucous membranes  Neck supple  Pulm/lungs Normal breathing effort, clear to auscultation  CVS/Heart Regular, no rub or gallop  Abdomen:  Soft, nontender, nondistended  Extremities: Trace edema  Neurologic: Alert, oriented              Basic Metabolic Panel:   Recent Labs Lab 04/28/17 0406 04/29/17 0001 04/30/17 0543 05/01/17 0441 05/02/17 0323  NA 134* 135 135 134* 136  K 4.0 3.8 4.6 4.1 4.6  CL 102 100* 105 101 100*  CO2 23 23 20* 23 24  GLUCOSE 111* 128* 109* 119* 95  BUN 51* 59* 53* 64* 68*  CREATININE 3.76* 3.53* 3.14* 3.61* 3.36*  CALCIUM 7.9* 8.1* 8.9 8.5* 9.0     CBC:  Recent Labs Lab 04/28/17 0406 04/29/17 0001 04/30/17 0543 05/01/17 0441 05/02/17 0323  WBC 10.8* 9.9 10.8* 9.0 8.6  NEUTROABS  --   --   --  7.4*  --   HGB 9.8* 9.5* 10.6* 9.8* 10.4*  HCT 28.1* 27.5* 31.0* 28.9* 30.3*  MCV 89.1 89.6 88.0 89.1 89.5  PLT 213 213 316 304 335     No results found for: HEPBSAG, HEPBSAB,  HEPBIGM    Microbiology:  Recent Results (from the past 240 hour(s))  Blood Culture (routine x 2)     Status: None   Collection Time: 04/27/17  3:58 PM  Result Value Ref Range Status   Specimen Description BLOOD LEFT HAND  Final   Special Requests   Final    BOTTLES DRAWN AEROBIC AND ANAEROBIC Blood Culture adequate volume   Culture NO GROWTH 5 DAYS  Final   Report Status 05/02/2017 FINAL  Final  Aerobic/Anaerobic Culture (surgical/deep wound)     Status: None (Preliminary result)   Collection Time: 04/27/17  3:59 PM  Result Value Ref Range Status   Specimen Description WOUND  Final   Special Requests RIGHT AXILLA  Final   Gram Stain   Final    ABUNDANT WBC PRESENT, PREDOMINANTLY MONONUCLEAR ABUNDANT GRAM POSITIVE COCCI IN CLUSTERS    Culture   Final    ABUNDANT METHICILLIN RESISTANT STAPHYLOCOCCUS AUREUS NO ANAEROBES ISOLATED Performed at [facility], [address], [place], [place] [address]    Report Status PENDING  Incomplete   Organism  ID, Bacteria METHICILLIN RESISTANT STAPHYLOCOCCUS AUREUS  Final      Susceptibility   Methicillin resistant staphylococcus aureus - MIC*    CIPROFLOXACIN >=8 RESISTANT Resistant     ERYTHROMYCIN >=8 RESISTANT Resistant     GENTAMICIN <=0.5 SENSITIVE Sensitive     OXACILLIN >=4 RESISTANT Resistant     TETRACYCLINE <=1 SENSITIVE Sensitive     VANCOMYCIN 1 SENSITIVE Sensitive     TRIMETH/SULFA <=10 SENSITIVE Sensitive     CLINDAMYCIN <=0.25 SENSITIVE Sensitive     RIFAMPIN <=0.5 SENSITIVE Sensitive     Inducible Clindamycin NEGATIVE Sensitive     * ABUNDANT METHICILLIN RESISTANT STAPHYLOCOCCUS AUREUS  Blood Culture (routine x 2)     Status: Abnormal   Collection Time: 04/27/17  4:15 PM  Result Value Ref Range Status   Specimen Description BLOOD LEFT HAND  Final   Special Requests   Final    BOTTLES DRAWN AEROBIC AND ANAEROBIC Blood Culture adequate volume   Culture  Setup Time   Final    GRAM POSITIVE RODS AEROBIC BOTTLE  ONLY RESULT CALLED TO, READ BACK BY AND VERIFIED WITH: CHRISTINE KATSOUDAS AT 6237 ON 04/28/17 [place].    Culture (A)  Final    BACILLUS SPECIES Standardized susceptibility testing for this organism is not available. Performed at [facility], [place] [address] [place], [place] [address]    Report Status 04/29/2017 FINAL  Final  Blood Culture ID Panel (Reflexed)     Status: None   Collection Time: 04/27/17  4:15 PM  Result Value Ref Range Status   Enterococcus species NOT DETECTED NOT DETECTED Final   Listeria monocytogenes NOT DETECTED NOT DETECTED Final   Staphylococcus species NOT DETECTED NOT DETECTED Final   Staphylococcus aureus NOT DETECTED NOT DETECTED Final   Streptococcus species NOT DETECTED NOT DETECTED Final   Streptococcus agalactiae NOT DETECTED NOT DETECTED Final   Streptococcus pneumoniae NOT DETECTED NOT DETECTED Final   Streptococcus pyogenes NOT DETECTED NOT DETECTED Final   Acinetobacter baumannii NOT DETECTED NOT DETECTED Final   Enterobacteriaceae species NOT DETECTED NOT DETECTED Final   Enterobacter cloacae complex NOT DETECTED NOT DETECTED Final   Escherichia coli NOT DETECTED NOT DETECTED Final   Klebsiella oxytoca NOT DETECTED NOT DETECTED Final   Klebsiella pneumoniae NOT DETECTED NOT DETECTED Final   Proteus species NOT DETECTED NOT DETECTED Final   Serratia marcescens NOT DETECTED NOT DETECTED Final   Haemophilus influenzae NOT DETECTED NOT DETECTED Final   Neisseria meningitidis NOT DETECTED NOT DETECTED Final   Pseudomonas aeruginosa NOT DETECTED NOT DETECTED Final   Candida albicans NOT DETECTED NOT DETECTED Final   Candida glabrata NOT DETECTED NOT DETECTED Final   Candida krusei NOT DETECTED NOT DETECTED Final   Candida parapsilosis NOT DETECTED NOT DETECTED Final   Candida tropicalis NOT DETECTED NOT DETECTED Final  Urine culture     Status: None   Collection Time: 04/28/17 10:35 AM  Result Value Ref Range Status   Specimen Description  URINE, RANDOM  Final   Special Requests NONE  Final   Culture   Final    NO GROWTH Performed at [facility], [address], [place], [place] [address]    Report Status 04/29/2017 FINAL  Final  MRSA PCR Screening     Status: Abnormal   Collection Time: 04/29/17 11:10 AM  Result Value Ref Range Status   MRSA by PCR POSITIVE (A) NEGATIVE Final    Comment:  The GeneXpert MRSA Assay (FDA approved for NASAL specimens only), is one component of a comprehensive MRSA colonization surveillance program. It is not intended to diagnose MRSA infection nor to guide or monitor treatment for MRSA infections. RESULT CALLED TO, READ BACK BY AND VERIFIED WITH:  JEANNA DENTON AT 1244 04/29/17 SDR   Aerobic/Anaerobic Culture (surgical/deep wound)     Status: None (Preliminary result)   Collection Time: 04/29/17  2:22 PM  Result Value Ref Range Status   Specimen Description ABSCESS RIGHT AXILLA  Final   Special Requests SAMPLE A  Final   Gram Stain   Final    FEW WBC PRESENT, PREDOMINANTLY PMN MODERATE GRAM POSITIVE COCCI IN CLUSTERS Performed at [facility], [address] [place], [place] [address]    Culture   Final    ABUNDANT STAPHYLOCOCCUS AUREUS SUSCEPTIBILITIES PERFORMED ON PREVIOUS CULTURE WITHIN THE LAST 5 DAYS. NO ANAEROBES ISOLATED; CULTURE IN PROGRESS FOR 5 DAYS    Report Status PENDING  Incomplete  Aerobic/Anaerobic Culture (surgical/deep wound)     Status: None (Preliminary result)   Collection Time: 04/29/17  2:22 PM  Result Value Ref Range Status   Specimen Description ABSCESS LEFT GROIN  Final   Special Requests SAMPLE B  Final   Gram Stain   Final    FEW WBC PRESENT, PREDOMINANTLY PMN FEW GRAM POSITIVE COCCI IN CLUSTERS Performed at [facility], [address], [place], [place] [address]    Culture   Final    FEW METHICILLIN RESISTANT STAPHYLOCOCCUS AUREUS NO ANAEROBES ISOLATED; CULTURE IN PROGRESS FOR 5 DAYS    Report Status PENDING   Incomplete   Organism ID, Bacteria METHICILLIN RESISTANT STAPHYLOCOCCUS AUREUS  Final      Susceptibility   Methicillin resistant staphylococcus aureus - MIC*    CIPROFLOXACIN >=8 RESISTANT Resistant     ERYTHROMYCIN >=8 RESISTANT Resistant     GENTAMICIN <=0.5 SENSITIVE Sensitive     OXACILLIN >=4 RESISTANT Resistant     TETRACYCLINE <=1 SENSITIVE Sensitive     VANCOMYCIN 1 SENSITIVE Sensitive     TRIMETH/SULFA <=10 SENSITIVE Sensitive     CLINDAMYCIN <=0.25 SENSITIVE Sensitive     RIFAMPIN <=0.5 SENSITIVE Sensitive     Inducible Clindamycin NEGATIVE Sensitive     * FEW METHICILLIN RESISTANT STAPHYLOCOCCUS AUREUS    Coagulation Studies: No results for input(s): LABPROT, INR in the last 72 hours.  Urinalysis: No results for input(s): COLORURINE, LABSPEC, PHURINE, GLUCOSEU, HGBUR, BILIRUBINUR, KETONESUR, PROTEINUR, UROBILINOGEN, NITRITE, LEUKOCYTESUR in the last 72 hours.  Invalid input(s): APPERANCEUR    Imaging: No results found.   Medications:   . piperacillin-tazobactam (ZOSYN)  IV 3.375 g (05/02/17 1001)   . apixaban  5 mg Oral BID  . Chlorhexidine Gluconate Cloth  6 each Topical Q0600  . docusate sodium  100 mg Oral Daily  . doxycycline  100 mg Oral Q12H  . losartan  100 mg Oral Daily  . mupirocin ointment  1 application Nasal BID  . mycophenolate  1,000 mg Oral BID  . polyethylene glycol  17 g Oral Daily  . prednisoLONE  10 mg Oral Daily  . rosuvastatin  10 mg Oral QHS   bisacodyl, diphenhydrAMINE, docusate sodium, ipratropium-albuterol, morphine injection, ondansetron (ZOFRAN) IV, traMADol  Assessment/ Plan:  56 y.o.  male with past history of pulmonary embolus, history of IVC filter placement, right ankle fracture status post ORIF, and herniated disc, lupus nephritis, proteinuria, CKD 3, hypertension  1.  Acute renal failure on  chronic kidney disease stage III. Baseline Cr 1.88 on 03/31/17 2.  Lupus nephritis.  Treated with CellCept for the past one and half  years.  Relapse February 2018 3.  Right axillary abscess- MRSA 4.  Hematuria and proteinuria   Urine protein to creatinine ratio 0.73.  Complement levels are normal. Patient has had long-standing hematuria. He is currently on anticoagulation which may be playing a role  Acute worsening of renal failure appears to be secondary to concurrent abscess/infection in the right axillary area.  Patient is being treated with broad-spectrum antibiotics.  Surgery team is following along. Underwent debridement  04/29/17   Continue prednisone, cellcept, losartan       LOS: 5 [name] Fulbright 5/12/201812:26 PM  [facility] [place], [place]

## 2017-05-02 NOTE — Progress Notes (Addendum)
[facility] [place] NAME: Jonathon Snow    MR#:  [id number]  DATE OF BIRTH:  03/06/1961  SUBJECTIVE:  CHIEF COMPLAINT:  Pt had Axillary and scrotal abscess drainage done on 04/29/2017.Denies any nausea or vomiting today. Feeling weak and tired  REVIEW OF SYSTEMS:  CONSTITUTIONAL: No fever, fatigue or weakness.  EYES: No blurred or double vision.  EARS, NOSE, AND THROAT: No tinnitus or ear pain.  RESPIRATORY: No cough,Slightly improved shortness of breath, no wheezing or hemoptysis.  CARDIOVASCULAR: No chest pain, orthopnea, edema.  GASTROINTESTINAL: No nausea, vomiting, diarrhea or abdominal pain.  GENITOURINARY: No dysuria, hematuria.  ENDOCRINE: No polyuria, nocturia,  HEMATOLOGY: No anemia, easy bruising or bleeding SKIN: Status post drainage of the axillary abscess and scrotal abscess MUSCULOSKELETAL: No joint pain or arthritis.   NEUROLOGIC: No tingling, numbness, weakness.  PSYCHIATRY: No anxiety or depression.   DRUG ALLERGIES:   Allergies  Allergen Reactions  . Vicodin [Hydrocodone-Acetaminophen] Hives and Other (See Comments)    Rash/hives Other Reaction: severe headaches Rash/hives Other Reaction: severe headaches Rash/hives  . Hydrocodone Rash  . Oxycodone Rash    Takes occasionally.    VITALS:  Blood pressure (!) 144/73, pulse 65, temperature 97.4 F (36.3 C), temperature source Oral, resp. rate 20, height 6\' 2"  (1.88 m), weight 107 kg (236 lb), SpO2 99 %.  PHYSICAL EXAMINATION:  GENERAL:  56 y.o.-year-old patient lying in the bed with no acute distress.  EYES: Pupils equal, round, reactive to light and accommodation. No scleral icterus. Extraocular muscles intact.  HEENT: Head atraumatic, normocephalic. Oropharynx and nasopharynx clear.  NECK:  Supple, no jugular venous distention. No thyroid enlargement, no tenderness.  LUNGS: Normal breath sounds bilaterally, no wheezing, rales,rhonchi or crepitation.  No use of accessory muscles of respiration.  CARDIOVASCULAR: S1, S2 normal. No murmurs, rubs, or gallops.  ABDOMEN: Soft, nontender, nondistended. Bowel sounds present. Scrotum with small abscess, fluctuant and tender( examined patient in the presence of RN) EXTREMITIES: No pedal edema, cyanosis, or clubbing.  NEUROLOGIC: Cranial nerves II through XII are intact. Muscle strength 5/5 in all extremities. Sensation intact. Gait not checked.  PSYCHIATRIC: The patient is alert and oriented x 3.  SKIN: Right axillary abscess status post drainage clean dressing. Scrotal abscess status post drainage   LABORATORY PANEL:   CBC  Recent Labs Lab 05/02/17 0323  WBC 8.6  HGB 10.4*  HCT 30.3*  PLT 335   ------------------------------------------------------------------------------------------------------------------  Chemistries   Recent Labs Lab 04/27/17 1559  05/02/17 0323  NA  --   < > 136  K  --   < > 4.6  CL  --   < > 100*  CO2  --   < > 24  GLUCOSE  --   < > 95  BUN  --   < > 68*  CREATININE  --   < > 3.36*  CALCIUM  --   < > 9.0  AST 18  --   --   ALT 22  --   --   ALKPHOS 57  --   --   BILITOT 1.0  --   --   < > = values in this interval not displayed. ------------------------------------------------------------------------------------------------------------------  Cardiac Enzymes  Recent Labs Lab 04/27/17 1559  TROPONINI 0.03*   ------------------------------------------------------------------------------------------------------------------  RADIOLOGY:  No results found.  EKG:   Orders placed or performed during the hospital encounter of 04/27/17  . ED EKG  . ED EKG  . EKG  12-Lead  . EKG 12-Lead    ASSESSMENT AND PLAN:   * Acute on chronic renal failure -3 . History of lupus nephritis   worsening of renal function could be from right axillary abscess. No clinically improving On IV antibiotics   Currently he is on steroids 10 mg oral prednisone daily  and CellCept, continue the same    follow-up with nephrology. Complement C3 at 126 and C4 21-nml Cr 3.76-3.53--3.1-3.7-3.36, vancomycin discontinued Follow-up with nephrology  *Chronic hematuria- on anticoagulation heparin gtt currently, resumed Eliquis  Follow-up with urology op ,d/w dr.Brandon  * Right axillary abscess Pt had incision and drainage by surgery 04/29/17. Wound culture with the gram-positive cocci in clusters-MRSA  IV antibiotics Zosyn and vancomycin were given initially. Discontinue vancomycin. We will continue IV Zosyn and patient is started on doxycycline, we will follow up on the final culture results -ID consult placed Blood cultures with gram-positive rods bacillus species susceptibility is not available Okay to discharge patient from surgical standpoint. Patient needs outpatient follow-up with surgery in a week for drain removal  * scrotal abscess-Urology is consulted. Patient had incision and drainage of the scrotal abscess on 04/29/2017. Scrotal abscess culture with gram-positive cocci - MRSA continue current IV antibiotics Urology signed off. Continue daily dressings Outpatient follow-up with urology for cystoscopy and wound check  * Lupus   .Currently he is on steroids 10 mg oral prednisone daily and CellCept, continue the same.  * History of DVT and PE   Continue anticoagulation with Eliquis  * Hypertension   Continue losartan.  * Constipation-laxatives   PTconsult placed   All the records are reviewed and case discussed with Care Management/Social Workerr. Management plans discussed with the patient, family and they are in agreement.  CODE STATUS: fc   TOTAL TIME TAKING CARE OF THIS PATIENT: 35  minutes.   POSSIBLE D/C IN 2-3  DAYS, DEPENDING ON CLINICAL CONDITION.  Note: This dictation was prepared with Dragon dictation along with smaller phrase technology. Any transcriptional errors that result from this process are unintentional.   Jonathon Snow M.D on 05/02/2017 at 11:19 AM  Between 7am to 6pm - Pager - [phone] After 6pm go to [email or web address] - password [email or web address] [place]  [facility]  Office  [phone]  CC: Primary care physician; Roselee Nova, MD

## 2017-05-03 LAB — BASIC METABOLIC PANEL
Anion gap: 9 (ref 5–15)
BUN: 65 mg/dL — ABNORMAL HIGH (ref 6–20)
CALCIUM: 8.7 mg/dL — AB (ref 8.9–10.3)
CO2: 23 mmol/L (ref 22–32)
CREATININE: 3.02 mg/dL — AB (ref 0.61–1.24)
Chloride: 105 mmol/L (ref 101–111)
GFR calc non Af Amer: 22 mL/min — ABNORMAL LOW (ref >60)
GFR, EST AFRICAN AMERICAN: 25 mL/min — AB (ref >60)
Glucose, Bld: 109 mg/dL — ABNORMAL HIGH (ref 65–99)
Potassium: 4.2 mmol/L (ref 3.5–5.1)
SODIUM: 137 mmol/L (ref 135–145)

## 2017-05-03 MED ORDER — TRAMADOL HCL 50 MG PO TABS: 100.0000 mg | ORAL_TABLET | Freq: Four times a day (QID) | ORAL | 0 refills | Status: DC | PRN

## 2017-05-03 MED ORDER — MUPIROCIN 2 % EX OINT: 1.0000 "application " | TOPICAL_OINTMENT | Freq: Two times a day (BID) | CUTANEOUS | 0 refills | Status: DC

## 2017-05-03 MED ORDER — DOXYCYCLINE HYCLATE 100 MG PO TABS: 100.0000 mg | ORAL_TABLET | Freq: Two times a day (BID) | ORAL | 0 refills | Status: AC

## 2017-05-03 MED ORDER — POLYETHYLENE GLYCOL 3350 17 G PO PACK: 17.0000 g | PACK | Freq: Every day | ORAL | 0 refills | Status: DC

## 2017-05-03 NOTE — Progress Notes (Signed)
Pt d/c to home today. IV removed intact.  Rx's given to pt w/all questions and concerns addressed.  D/C paperwork reviewed and education provided with all questions and concerns addressed.  Pt family at bedside for home transport.   

## 2017-05-03 NOTE — Discharge Instructions (Signed)
Follow-up with primary care physician in a week Follow-up with general surgery in a week Follow-up with urology Dr. Erlene Quan on May 31 at 11:15 AM Continue daily dressings of both abscesses in the right axilla and scrotal area

## 2017-05-03 NOTE — Care Management Note (Signed)
Case Management Note  Patient Details  Name: Jonathon Snow MRN: [id number] Date of Birth: [birth year]  Subjective/Objective:     Discussed discharge planning with [name] who chose [facility] as his home health provider because they are in network with his insurance. A referral for home health RN was called to Ardeen Fillers at [facility]. No other discharge needs identified.               Action/Plan:   Expected Discharge Date:  05/03/17               Expected Discharge Plan:  05/03/17 In-House Referral:     Discharge planning Services     Post Acute Care Choice:   Yes Choice offered to:   Pt  DME Arranged:   NA DME Agency:   NA  HH Arranged:   RN [place] Agency:   [facility]  Status of Service:   Completed  If discussed at [employer], dates discussed:    Additional Comments:  Florine Sprenkle A, RN 05/03/2017, 11:53 AM

## 2017-05-03 NOTE — Progress Notes (Signed)
[facility], [place] 05/03/17  Subjective:   No new complaints Underwent axillary abscess dranage and scrotal abscess dranage / 04/29/17 S Creatinine is slightly better oday Clinically feels well.    Objective:  Vital signs in last 24 hours:  Temp:  [97.4 F (36.3 C)-98 F (36.7 C)] 97.4 F (36.3 C) (05/13 0512) Pulse Rate:  [70-76] 71 (05/13 0512) Resp:  [16-19] 16 (05/13 0512) BP: (141-148)/(72-90) 141/90 (05/13 0512) SpO2:  [98 %-100 %] 98 % (05/13 0512)  Weight change:  Filed Weights   04/27/17 1056 04/29/17 1146  Weight: 107 kg (236 lb) 107 kg (236 lb)    Intake/Output:    Intake/Output Summary (Last 24 hours) at 05/03/17 1212 Last data filed at 05/03/17 1137  Gross per 24 hour  Intake              340 ml  Output             1225 ml  Net             -885 ml     Physical Exam: General: No acute distress, lying in the bed  HEENT Anicteric, moist oral mucous membranes  Neck supple  Pulm/lungs Normal breathing effort, clear to auscultation  CVS/Heart Regular, no rub or gallop  Abdomen:  Soft, nontender, nondistended  Extremities: Trace edema  Neurologic: Alert, oriented              Basic Metabolic Panel:   Recent Labs Lab 04/29/17 0001 04/30/17 0543 05/01/17 0441 05/02/17 0323 05/03/17 0336  NA 135 135 134* 136 137  K 3.8 4.6 4.1 4.6 4.2  CL 100* 105 101 100* 105  CO2 23 20* 23 24 23   GLUCOSE 128* 109* 119* 95 109*  BUN 59* 53* 64* 68* 65*  CREATININE 3.53* 3.14* 3.61* 3.36* 3.02*  CALCIUM 8.1* 8.9 8.5* 9.0 8.7*     CBC:  Recent Labs Lab 04/28/17 0406 04/29/17 0001 04/30/17 0543 05/01/17 0441 05/02/17 0323  WBC 10.8* 9.9 10.8* 9.0 8.6  NEUTROABS  --   --   --  7.4*  --   HGB 9.8* 9.5* 10.6* 9.8* 10.4*  HCT 28.1* 27.5* 31.0* 28.9* 30.3*  MCV 89.1 89.6 88.0 89.1 89.5  PLT 213 213 316 304 335     No results found for: HEPBSAG, HEPBSAB, HEPBIGM    Microbiology:  Recent Results (from the past 240  hour(s))  Blood Culture (routine x 2)     Status: None   Collection Time: 04/27/17  3:58 PM  Result Value Ref Range Status   Specimen Description BLOOD LEFT HAND  Final   Special Requests   Final    BOTTLES DRAWN AEROBIC AND ANAEROBIC Blood Culture adequate volume   Culture NO GROWTH 5 DAYS  Final   Report Status 05/02/2017 FINAL  Final  Aerobic/Anaerobic Culture (surgical/deep wound)     Status: None   Collection Time: 04/27/17  3:59 PM  Result Value Ref Range Status   Specimen Description WOUND  Final   Special Requests RIGHT AXILLA  Final   Gram Stain   Final    ABUNDANT WBC PRESENT, PREDOMINANTLY MONONUCLEAR ABUNDANT GRAM POSITIVE COCCI IN CLUSTERS    Culture   Final    ABUNDANT METHICILLIN RESISTANT STAPHYLOCOCCUS AUREUS NO ANAEROBES ISOLATED Performed at [facility], [address], [place], [place] [address]    Report Status 05/02/2017 FINAL  Final   Organism ID, Bacteria METHICILLIN RESISTANT STAPHYLOCOCCUS AUREUS  Final  Susceptibility   Methicillin resistant staphylococcus aureus - MIC*    CIPROFLOXACIN >=8 RESISTANT Resistant     ERYTHROMYCIN >=8 RESISTANT Resistant     GENTAMICIN <=0.5 SENSITIVE Sensitive     OXACILLIN >=4 RESISTANT Resistant     TETRACYCLINE <=1 SENSITIVE Sensitive     VANCOMYCIN 1 SENSITIVE Sensitive     TRIMETH/SULFA <=10 SENSITIVE Sensitive     CLINDAMYCIN <=0.25 SENSITIVE Sensitive     RIFAMPIN <=0.5 SENSITIVE Sensitive     Inducible Clindamycin NEGATIVE Sensitive     * ABUNDANT METHICILLIN RESISTANT STAPHYLOCOCCUS AUREUS  Blood Culture (routine x 2)     Status: Abnormal   Collection Time: 04/27/17  4:15 PM  Result Value Ref Range Status   Specimen Description BLOOD LEFT HAND  Final   Special Requests   Final    BOTTLES DRAWN AEROBIC AND ANAEROBIC Blood Culture adequate volume   Culture  Setup Time   Final    GRAM POSITIVE RODS AEROBIC BOTTLE ONLY RESULT CALLED TO, READ BACK BY AND VERIFIED WITH: CHRISTINE KATSOUDAS AT 4270  ON 04/28/17 [place].    Culture (A)  Final    BACILLUS SPECIES Standardized susceptibility testing for this organism is not available. Performed at [facility], [place] [address], [place], [place] [address]    Report Status 04/29/2017 FINAL  Final  Blood Culture ID Panel (Reflexed)     Status: None   Collection Time: 04/27/17  4:15 PM  Result Value Ref Range Status   Enterococcus species NOT DETECTED NOT DETECTED Final   Listeria monocytogenes NOT DETECTED NOT DETECTED Final   Staphylococcus species NOT DETECTED NOT DETECTED Final   Staphylococcus aureus NOT DETECTED NOT DETECTED Final   Streptococcus species NOT DETECTED NOT DETECTED Final   Streptococcus agalactiae NOT DETECTED NOT DETECTED Final   Streptococcus pneumoniae NOT DETECTED NOT DETECTED Final   Streptococcus pyogenes NOT DETECTED NOT DETECTED Final   Acinetobacter baumannii NOT DETECTED NOT DETECTED Final   Enterobacteriaceae species NOT DETECTED NOT DETECTED Final   Enterobacter cloacae complex NOT DETECTED NOT DETECTED Final   Escherichia coli NOT DETECTED NOT DETECTED Final   Klebsiella oxytoca NOT DETECTED NOT DETECTED Final   Klebsiella pneumoniae NOT DETECTED NOT DETECTED Final   Proteus species NOT DETECTED NOT DETECTED Final   Serratia marcescens NOT DETECTED NOT DETECTED Final   Haemophilus influenzae NOT DETECTED NOT DETECTED Final   Neisseria meningitidis NOT DETECTED NOT DETECTED Final   Pseudomonas aeruginosa NOT DETECTED NOT DETECTED Final   Candida albicans NOT DETECTED NOT DETECTED Final   Candida glabrata NOT DETECTED NOT DETECTED Final   Candida krusei NOT DETECTED NOT DETECTED Final   Candida parapsilosis NOT DETECTED NOT DETECTED Final   Candida tropicalis NOT DETECTED NOT DETECTED Final  Urine culture     Status: None   Collection Time: 04/28/17 10:35 AM  Result Value Ref Range Status   Specimen Description URINE, RANDOM  Final   Special Requests NONE  Final   Culture   Final    NO  GROWTH Performed at [facility], [address], [place], [place] [address]    Report Status 04/29/2017 FINAL  Final  MRSA PCR Screening     Status: Abnormal   Collection Time: 04/29/17 11:10 AM  Result Value Ref Range Status   MRSA by PCR POSITIVE (A) NEGATIVE Final    Comment:        The GeneXpert MRSA Assay (FDA approved for NASAL specimens only), is one component  of a comprehensive MRSA colonization surveillance program. It is not intended to diagnose MRSA infection nor to guide or monitor treatment for MRSA infections. RESULT CALLED TO, READ BACK BY AND VERIFIED WITH:  JEANNA DENTON AT 1244 04/29/17 SDR   Aerobic/Anaerobic Culture (surgical/deep wound)     Status: None (Preliminary result)   Collection Time: 04/29/17  2:22 PM  Result Value Ref Range Status   Specimen Description ABSCESS RIGHT AXILLA  Final   Special Requests SAMPLE A  Final   Gram Stain   Final    FEW WBC PRESENT, PREDOMINANTLY PMN MODERATE GRAM POSITIVE COCCI IN CLUSTERS Performed at [facility], [address], [place], [place] [address]    Culture   Final    ABUNDANT STAPHYLOCOCCUS AUREUS SUSCEPTIBILITIES PERFORMED ON PREVIOUS CULTURE WITHIN THE LAST 5 DAYS. NO ANAEROBES ISOLATED; CULTURE IN PROGRESS FOR 5 DAYS    Report Status PENDING  Incomplete  Aerobic/Anaerobic Culture (surgical/deep wound)     Status: None (Preliminary result)   Collection Time: 04/29/17  2:22 PM  Result Value Ref Range Status   Specimen Description ABSCESS LEFT GROIN  Final   Special Requests SAMPLE B  Final   Gram Stain   Final    FEW WBC PRESENT, PREDOMINANTLY PMN FEW GRAM POSITIVE COCCI IN CLUSTERS Performed at [facility], [address], [place], [place] [address]    Culture   Final    FEW METHICILLIN RESISTANT STAPHYLOCOCCUS AUREUS NO ANAEROBES ISOLATED; CULTURE IN PROGRESS FOR 5 DAYS    Report Status PENDING  Incomplete   Organism ID, Bacteria METHICILLIN RESISTANT STAPHYLOCOCCUS AUREUS   Final      Susceptibility   Methicillin resistant staphylococcus aureus - MIC*    CIPROFLOXACIN >=8 RESISTANT Resistant     ERYTHROMYCIN >=8 RESISTANT Resistant     GENTAMICIN <=0.5 SENSITIVE Sensitive     OXACILLIN >=4 RESISTANT Resistant     TETRACYCLINE <=1 SENSITIVE Sensitive     VANCOMYCIN 1 SENSITIVE Sensitive     TRIMETH/SULFA <=10 SENSITIVE Sensitive     CLINDAMYCIN <=0.25 SENSITIVE Sensitive     RIFAMPIN <=0.5 SENSITIVE Sensitive     Inducible Clindamycin NEGATIVE Sensitive     * FEW METHICILLIN RESISTANT STAPHYLOCOCCUS AUREUS    Coagulation Studies: No results for input(s): LABPROT, INR in the last 72 hours.  Urinalysis: No results for input(s): COLORURINE, LABSPEC, PHURINE, GLUCOSEU, HGBUR, BILIRUBINUR, KETONESUR, PROTEINUR, UROBILINOGEN, NITRITE, LEUKOCYTESUR in the last 72 hours.  Invalid input(s): APPERANCEUR    Imaging: No results found.   Medications:   . piperacillin-tazobactam (ZOSYN)  IV 3.375 g (05/03/17 0953)   . apixaban  5 mg Oral BID  . Chlorhexidine Gluconate Cloth  6 each Topical Q0600  . docusate sodium  100 mg Oral Daily  . doxycycline  100 mg Oral Q12H  . losartan  100 mg Oral Daily  . mupirocin ointment  1 application Nasal BID  . mycophenolate  1,000 mg Oral BID  . polyethylene glycol  17 g Oral Daily  . prednisoLONE  10 mg Oral Daily  . rosuvastatin  10 mg Oral QHS   bisacodyl, diphenhydrAMINE, docusate sodium, ipratropium-albuterol, morphine injection, ondansetron (ZOFRAN) IV, traMADol  Assessment/ Plan:  56 y.o.  male with past history of pulmonary embolus, history of IVC filter placement, right ankle fracture status post ORIF, and herniated disc, lupus nephritis, proteinuria, CKD 3, hypertension  1.  Acute renal failure on chronic kidney disease stage III 2.  Lupus nephritis.  Treated with CellCept  for the past one and half years.  Relapse February 2018 3.  Right axillary abscess- MRSA 4.  Hematuria and proteinuria   Urine  protein to creatinine ratio 0.73.  Complement levels are normal. Patient has had long-standing hematuria. He is currently on anticoagulation which may be playing a role  Acute worsening of renal failure appears to be secondary to concurrent abscess/infection in the right axillary area.  Patient is being treated with broad-spectrum antibiotics.  Surgery team is following along. Underwent debridement  04/29/17 Antibiotics as per Primary team Continue prednisone, cellcept, losartan Follow up in office       LOS: [place] 5/13/201812:12 [place], [place]

## 2017-05-03 NOTE — Discharge Summary (Signed)
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 DVT (deep venous thrombosis) ([place])   . Hyperlipidemia   . Hypertension   . Lupus   . Pulmonary embolism ([place])   . Renal disorder     HOSPITAL COURSE:   HISTORY OF PRESENT ILLNESS: Jonathon Snow  is a 56 y.o. male with a known history of Lupus with chronic kidney disease, hypertension, hyperlipidemia, DVT and pulmonary embolism on eliquis- had some flareup of his lupus for last 3-4 weeks and so he is actively following with nephrologist in the office. Today on his follow-up appointment he looked more weak and not in very good shape. On patient's side he complains that he is feeling excessively short of breath with minimal exertion for last few weeks. Concerned with this Dr. Holley Raring decided to send him to emergency room for further evaluation. He was also seen by his pulmonologist Dr. Raul Del in office and he was planning to get a CT scan on his chest done to rule out new pulmonary embolism or other findings. In ER CT scan of the chest was done which showed there is some small abscess in his right axillary area which is spontaneously draining, also noted to have worsening in his renal function.  * Acute on chronic renal failure -3 . History of lupus nephritis  worsening of renal function could be from right axillary abscess.  No clinically improving On IV antibiotics Currently he is on steroids 10 mg oral prednisone daily and CellCept, continue the same  follow-up with nephrology. Complement C3 at 126 and C4 21-nml Cr 3.76-3.53--3.1-3.7-3.36--3.0, vancomycin discontinued Patient's renal function is close to his baseline creatinine 2.5 okay to discharge patient from nephrology standpoint  *Chronic hematuria- on anticoagulation heparin gtt currently, resumed Eliquis  Follow-up with urology op ,d/w dr.Brandon  * Right axillary abscess Pt had incision and drainage by surgery 04/29/17. Wound culture with the gram-positive cocci in clusters-MRSA  IV antibiotics Zosyn and vancomycin were given initially. Discontinue vancomycin and  IV Zosyn and patient is started on doxycycline. -ID consult placed Blood cultures with MRSA gram-positive rods bacillus species susceptibility is not available. Discharge patient with doxycycline Okay to discharge patient from surgical standpoint. Patient needs outpatient follow-up with surgery in a week for drain removal  * scrotal abscess-Urology is consulted. Patient had incision and drainage of the scrotal abscess on 04/29/2017. Scrotal abscess culture with gram-positive cocci - MRSA , discharged home with doxycycline Urology signed off. Continue daily dressings Outpatient follow-up with urology for cystoscopy and wound check  * Lupus .Currently he is on steroids 10 mg oral prednisone daily and CellCept, continue the same.  * History of DVT and PE Continue anticoagulation with Eliquis  * Hypertension Continue losartan.  * Constipation-laxatives as needed  PT recommended outpatient physical therapy, will arrange outpatient PT and home health RN  DISCHARGE CONDITIONS:  FAIR  CONSULTS OBTAINED:  Treatment Team:  Jules Husbands, MD Murlean Iba, MD Hollice Espy, MD   PROCEDURES  RT Axillary abscess and scrotal abscess drainage  DRUG ALLERGIES:    Allergies  Allergen Reactions  . Vicodin [Hydrocodone-Acetaminophen] Hives and Other (See Comments)    Rash/hives Other Reaction: severe headaches Rash/hives Other Reaction: severe headaches Rash/hives  . Hydrocodone Rash  . Oxycodone Rash    Takes occasionally.    DISCHARGE MEDICATIONS:   Current Discharge Medication List    START taking these medications   Details  doxycycline (VIBRA-TABS) 100 MG tablet Take 1 tablet (100 mg total) by mouth every 12 (twelve) hours. Qty: 14 tablet, Refills: 0    mupirocin ointment (BACTROBAN) 2 % Place 1 application into the nose 2 (two) times daily. Qty: 22 g, Refills: 0    polyethylene glycol (MIRALAX / GLYCOLAX) packet Take 17 g by mouth daily. Qty: 14 each, Refills: 0    traMADol (ULTRAM) 50 MG tablet Take 2 tablets (100 mg total) by mouth every 6 (six) hours as needed for moderate pain. Qty: 30 tablet, Refills: 0      CONTINUE these medications which have NOT CHANGED   Details  COMBIVENT RESPIMAT 20-100 MCG/ACT AERS respimat Inhale 1 puff into the lungs daily as needed.    ELIQUIS 5 MG TABS tablet TAKE 1 TABLET BY MOUTH 2 TIMES DAILY Qty: 60 tablet, Refills: 5    losartan (COZAAR) 100 MG tablet Take 100 mg by mouth daily. Refills: 6    Multiple Vitamins-Minerals (MULTIVITAMIN WITH MINERALS) tablet Take 1 tablet by mouth daily.    mycophenolate (CELLCEPT) 500 MG tablet Take 1,000 mg by mouth 2 (two) times daily.    prednisoLONE 5 MG TABS tablet Take 10 mg by mouth daily.    rosuvastatin (CRESTOR) 10 MG tablet Take 1 tablet (10 mg total) by mouth at bedtime. Qty: 90 tablet, Refills: 0         DISCHARGE INSTRUCTIONS:   Follow-up with primary care physician in a week Follow-up with general surgery in a week Follow-up with urology Dr. Erlene Quan on May 31 at 11:15 AM Continue daily dressings of both abscesses in the right axilla and scrotal area  DIET:  Cardiac diet  DISCHARGE CONDITION:  Stable  ACTIVITY:   Activity as tolerated  OPPT  OXYGEN:  Home Oxygen: No.   Oxygen Delivery: room air  DISCHARGE LOCATION:  home   If you experience worsening of your admission symptoms, develop shortness of breath, life threatening emergency, suicidal or homicidal thoughts you must seek medical attention immediately by calling 911 or calling your MD immediately  if symptoms less severe.  You Must read complete instructions/literature along with all the possible adverse reactions/side effects for all the Medicines you take and that have been prescribed to you. Take any new Medicines after you have completely understood and accpet all the possible adverse reactions/side effects.   Please note  You were cared for by a hospitalist during your hospital stay. If you have any questions about your discharge medications or the care you received while you were in the hospital after you are discharged, you can call the unit and asked to speak with the hospitalist on call if the hospitalist that took care of you is not available. Once you are discharged, your primary care physician will handle any further medical issues. Please note that NO REFILLS for any discharge medications will be authorized once you are discharged, as it is imperative that you return  to your primary care physician (or establish a relationship with a primary care physician if you do not have one) for your aftercare needs so that they can reassess your need for medications and monitor your lab values.     Today  Chief Complaint  Patient presents with  . Shortness of Breath  . Dizziness   Patient is doing fine. No complaints  ROS:  CONSTITUTIONAL: Denies fevers, chills. Denies any fatigue, weakness.  EYES: Denies blurry vision, double vision, eye pain. EARS, NOSE, THROAT: Denies tinnitus, ear pain, hearing loss. RESPIRATORY: Denies cough, wheeze, shortness of breath.  CARDIOVASCULAR: Denies chest pain, palpitations, edema.   GASTROINTESTINAL: Denies nausea, vomiting, diarrhea, abdominal pain. Denies bright red blood per rectum. GENITOURINARY: Denies dysuria, hematuria. ENDOCRINE: Denies nocturia or thyroid problems. HEMATOLOGIC AND LYMPHATIC: Denies easy bruising or bleeding. SKIN: Denies rash or lesion. Scrotal area with minimal discomfort  MUSCULOSKELETAL: Right axillary area minimal discomfort Denies pain in neck, back, shoulder, knees, hips or arthritic symptoms.  NEUROLOGIC: Denies paralysis, paresthesias.  PSYCHIATRIC: Denies anxiety or depressive symptoms.   VITAL SIGNS:  Blood pressure (!) 141/90, pulse 71, temperature 97.4 F (36.3 C), temperature source Oral, resp. rate 16, height 6\' 2"  (1.88 m), weight 107 kg (236 lb), SpO2 98 %.  I/O:    Intake/Output Summary (Last 24 hours) at 05/03/17 1201 Last data filed at 05/03/17 1137  Gross per 24 hour  Intake              340 ml  Output             1225 ml  Net             -885 ml    PHYSICAL EXAMINATION:  GENERAL:  56 y.o.-year-old patient lying in the bed with no acute distress.  EYES: Pupils equal, round, reactive to light and accommodation. No scleral icterus. Extraocular muscles intact.  HEENT: Head atraumatic, normocephalic. Oropharynx and nasopharynx clear.  NECK:  Supple, no jugular venous distention. No thyroid enlargement, no tenderness.  LUNGS: Normal breath sounds bilaterally, no wheezing, rales,rhonchi or crepitation. No use of accessory muscles of respiration.  CARDIOVASCULAR: S1, S2 normal. No murmurs, rubs, or gallops.  ABDOMEN: Soft, non-tender, non-distended. Bowel sounds present. No organomegaly or mass.  EXTREMITIES:Right axillary area with clean dressing No pedal edema, cyanosis, or clubbing.  NEUROLOGIC: Cranial nerves II through XII are intact. Muscle strength 5/5 in all extremities. Sensation intact. Gait not checked.  PSYCHIATRIC: The patient is alert and oriented x 3.  SKIN: Scrotal area with clean dressing No obvious  rash, lesion, or ulcer.   DATA REVIEW:   CBC  Recent Labs Lab 05/02/17 0323  WBC 8.6  HGB 10.4*  HCT 30.3*  PLT 335    Chemistries   Recent Labs Lab 04/27/17 1559  05/03/17 0336  NA  --   < > 137  K  --   < > 4.2  CL  --   < > 105  CO2  --   < > 23  GLUCOSE  --   < > 109*  BUN  --   < > 65*  CREATININE  --   < > 3.02*  CALCIUM  --   < > 8.7*  AST 18  --   --   ALT 22  --   --   ALKPHOS 57  --   --   BILITOT 1.0  --   --   < > = values in this interval not  displayed.  Cardiac Enzymes  Recent Labs Lab 04/27/17 1559  TROPONINI 0.03*    Microbiology Results  Results for orders placed or performed during the hospital encounter of 04/27/17  Blood Culture (routine x 2)     Status: None   Collection Time: 04/27/17  3:58 PM  Result Value Ref Range Status   Specimen Description BLOOD LEFT HAND  Final   Special Requests   Final    BOTTLES DRAWN AEROBIC AND ANAEROBIC Blood Culture adequate volume   Culture NO GROWTH 5 DAYS  Final   Report Status 05/02/2017 FINAL  Final  Aerobic/Anaerobic Culture (surgical/deep wound)     Status: None   Collection Time: 04/27/17  3:59 PM  Result Value Ref Range Status   Specimen Description WOUND  Final   Special Requests RIGHT AXILLA  Final   Gram Stain   Final    ABUNDANT WBC PRESENT, PREDOMINANTLY MONONUCLEAR ABUNDANT GRAM POSITIVE COCCI IN CLUSTERS    Culture   Final    ABUNDANT METHICILLIN RESISTANT STAPHYLOCOCCUS AUREUS NO ANAEROBES ISOLATED Performed at [facility], [place] [address] [place], [place] [address]    Report Status 05/02/2017 FINAL  Final   Organism ID, Bacteria METHICILLIN RESISTANT STAPHYLOCOCCUS AUREUS  Final      Susceptibility   Methicillin resistant staphylococcus aureus - MIC*    CIPROFLOXACIN >=8 RESISTANT Resistant     ERYTHROMYCIN >=8 RESISTANT Resistant     GENTAMICIN <=0.5 SENSITIVE Sensitive     OXACILLIN >=4 RESISTANT Resistant     TETRACYCLINE <=1 SENSITIVE Sensitive     VANCOMYCIN  1 SENSITIVE Sensitive     TRIMETH/SULFA <=10 SENSITIVE Sensitive     CLINDAMYCIN <=0.25 SENSITIVE Sensitive     RIFAMPIN <=0.5 SENSITIVE Sensitive     Inducible Clindamycin NEGATIVE Sensitive     * ABUNDANT METHICILLIN RESISTANT STAPHYLOCOCCUS AUREUS  Blood Culture (routine x 2)     Status: Abnormal   Collection Time: 04/27/17  4:15 PM  Result Value Ref Range Status   Specimen Description BLOOD LEFT HAND  Final   Special Requests   Final    BOTTLES DRAWN AEROBIC AND ANAEROBIC Blood Culture adequate volume   Culture  Setup Time   Final    GRAM POSITIVE RODS AEROBIC BOTTLE ONLY RESULT CALLED TO, READ BACK BY AND VERIFIED WITH: CHRISTINE KATSOUDAS AT 2831 ON 04/28/17 [place].    Culture (A)  Final    BACILLUS SPECIES Standardized susceptibility testing for this organism is not available. Performed at [facility], [place] [address] [place],  [address]    Report Status 04/29/2017 FINAL  Final  Blood Culture ID Panel (Reflexed)     Status: None   Collection Time: 04/27/17  4:15 PM  Result Value Ref Range Status   Enterococcus species NOT DETECTED NOT DETECTED Final   Listeria monocytogenes NOT DETECTED NOT DETECTED Final   Staphylococcus species NOT DETECTED NOT DETECTED Final   Staphylococcus aureus NOT DETECTED NOT DETECTED Final   Streptococcus species NOT DETECTED NOT DETECTED Final   Streptococcus agalactiae NOT DETECTED NOT DETECTED Final   Streptococcus pneumoniae NOT DETECTED NOT DETECTED Final   Streptococcus pyogenes NOT DETECTED NOT DETECTED Final   Acinetobacter baumannii NOT DETECTED NOT DETECTED Final   Enterobacteriaceae species NOT DETECTED NOT DETECTED Final   Enterobacter cloacae complex NOT DETECTED NOT DETECTED Final   Escherichia coli NOT DETECTED NOT DETECTED Final   Klebsiella oxytoca NOT DETECTED NOT DETECTED Final   Klebsiella pneumoniae NOT DETECTED NOT  DETECTED Final   Proteus species NOT DETECTED NOT DETECTED Final   Serratia marcescens NOT  DETECTED NOT DETECTED Final   Haemophilus influenzae NOT DETECTED NOT DETECTED Final   Neisseria meningitidis NOT DETECTED NOT DETECTED Final   Pseudomonas aeruginosa NOT DETECTED NOT DETECTED Final   Candida albicans NOT DETECTED NOT DETECTED Final   Candida glabrata NOT DETECTED NOT DETECTED Final   Candida krusei NOT DETECTED NOT DETECTED Final   Candida parapsilosis NOT DETECTED NOT DETECTED Final   Candida tropicalis NOT DETECTED NOT DETECTED Final  Urine culture     Status: None   Collection Time: 04/28/17 10:35 AM  Result Value Ref Range Status   Specimen Description URINE, RANDOM  Final   Special Requests NONE  Final   Culture   Final    NO GROWTH Performed at [facility], [place] [address], [place], [place] [address]    Report Status 04/29/2017 FINAL  Final  MRSA PCR Screening     Status: Abnormal   Collection Time: 04/29/17 11:10 AM  Result Value Ref Range Status   MRSA by PCR POSITIVE (A) NEGATIVE Final    Comment:        The GeneXpert MRSA Assay (FDA approved for NASAL specimens only), is one component of a comprehensive MRSA colonization surveillance program. It is not intended to diagnose MRSA infection nor to guide or monitor treatment for MRSA infections. RESULT CALLED TO, READ BACK BY AND VERIFIED WITH:  JEANNA DENTON AT 1244 04/29/17 S[facility]   Aerobic/Anaerobic Culture (surgical/deep wound)     Status: None (Preliminary result)   Collection Time: 04/29/17  2:22 PM  Result Value Ref Range Status   Specimen Description ABSCESS RIGHT AXILLA  Final   Special Requests SAMPLE A  Final   Gram Stain   Final    FEW WBC PRESENT, PREDOMINANTLY PMN MODERATE GRAM POSITIVE COCCI IN CLUSTERS Performed at [facility], [address] [place], [place] [address]    Culture   Final    ABUNDANT STAPHYLOCOCCUS AUREUS SUSCEPTIBILITIES PERFORMED ON PREVIOUS CULTURE WITHIN THE LAST 5 DAYS. NO ANAEROBES ISOLATED; CULTURE IN PROGRESS FOR 5 DAYS    Report Status PENDING   Incomplete  Aerobic/Anaerobic Culture (surgical/deep wound)     Status: None (Preliminary result)   Collection Time: 04/29/17  2:22 PM  Result Value Ref Range Status   Specimen Description ABSCESS LEFT GROIN  Final   Special Requests SAMPLE B  Final   Gram Stain   Final    FEW WBC PRESENT, PREDOMINANTLY PMN FEW GRAM POSITIVE COCCI IN CLUSTERS Performed at [facility], [address] [place], [place] [address]    Culture   Final    FEW METHICILLIN RESISTANT STAPHYLOCOCCUS AUREUS NO ANAEROBES ISOLATED; CULTURE IN PROGRESS FOR 5 DAYS    Report Status PENDING  Incomplete   Organism ID, Bacteria METHICILLIN RESISTANT STAPHYLOCOCCUS AUREUS  Final      Susceptibility   Methicillin resistant staphylococcus aureus - MIC*    CIPROFLOXACIN >=8 RESISTANT Resistant     ERYTHROMYCIN >=8 RESISTANT Resistant     GENTAMICIN <=0.5 SENSITIVE Sensitive     OXACILLIN >=4 RESISTANT Resistant     TETRACYCLINE <=1 SENSITIVE Sensitive     VANCOMYCIN 1 SENSITIVE Sensitive     TRIMETH/SULFA <=10 SENSITIVE Sensitive     CLINDAMYCIN <=0.25 SENSITIVE Sensitive     RIFAMPIN <=0.5 SENSITIVE Sensitive     Inducible Clindamycin NEGATIVE Sensitive     * FEW METHICILLIN  RESISTANT STAPHYLOCOCCUS AUREUS    RADIOLOGY:  No results found.  EKG:   Orders placed or performed during the hospital encounter of 04/27/17  . ED EKG  . ED EKG  . EKG 12-Lead  . EKG 12-Lead      Management plans discussed with the patient, family and they are in agreement.  CODE STATUS:     Code Status Orders        Start     Ordered   04/27/17 1848  Full code  Continuous     04/27/17 1847    Code Status History    Date Active Date Inactive Code Status Order ID Comments User Context   This patient has a current code status but no historical code status.      TOTAL TIME TAKING CARE OF THIS PATIENT: 43 minutes.   Note: This dictation was prepared with Dragon dictation along with smaller phrase technology. Any  transcriptional errors that result from this process are unintentional.   @[facility] @  on 05/03/2017 at 12:01 PM  Between 7am to 6pm - Pager - [phone]  After 6pm go to [email or web address] - password [email or web address] [facility]  [facility]  Office  [phone]  CC: Primary care physician; Roselee Nova, MD

## 2017-05-04 LAB — AEROBIC/ANAEROBIC CULTURE (SURGICAL/DEEP WOUND)

## 2017-05-04 LAB — AEROBIC/ANAEROBIC CULTURE W GRAM STAIN (SURGICAL/DEEP WOUND)

## 2017-05-05 DIAGNOSIS — B9562 Methicillin resistant Staphylococcus aureus infection as the cause of diseases classified elsewhere: Secondary | ICD-10-CM | POA: Diagnosis not present

## 2017-05-05 DIAGNOSIS — N492 Inflammatory disorders of scrotum: Secondary | ICD-10-CM | POA: Diagnosis not present

## 2017-05-05 DIAGNOSIS — L02411 Cutaneous abscess of right axilla: Secondary | ICD-10-CM | POA: Diagnosis not present

## 2017-05-06 ENCOUNTER — Other Ambulatory Visit: Payer: Self-pay

## 2017-05-07 ENCOUNTER — Ambulatory Visit (INDEPENDENT_AMBULATORY_CARE_PROVIDER_SITE_OTHER): Payer: 59 | Admitting: General Surgery

## 2017-05-07 ENCOUNTER — Observation Stay
Admission: EM | Admit: 2017-05-07 | Discharge: 2017-05-09 | Disposition: A | Discharge status: Home / Self Care | Payer: 59 | Attending: Internal Medicine | Admitting: Internal Medicine

## 2017-05-07 ENCOUNTER — Encounter: Payer: Self-pay | Admitting: Emergency Medicine

## 2017-05-07 ENCOUNTER — Encounter: Payer: Self-pay | Admitting: General Surgery

## 2017-05-07 VITALS — BP 151/90 | HR 98 | Temp 97.8°F | Ht 74.0 in | Wt 228.8 lb

## 2017-05-07 DIAGNOSIS — L02419 Cutaneous abscess of limb, unspecified: Secondary | ICD-10-CM | POA: Diagnosis not present

## 2017-05-07 DIAGNOSIS — I2782 Chronic pulmonary embolism: Secondary | ICD-10-CM | POA: Insufficient documentation

## 2017-05-07 DIAGNOSIS — J449 Chronic obstructive pulmonary disease, unspecified: Secondary | ICD-10-CM | POA: Diagnosis not present

## 2017-05-07 DIAGNOSIS — M3214 Glomerular disease in systemic lupus erythematosus: Secondary | ICD-10-CM | POA: Insufficient documentation

## 2017-05-07 DIAGNOSIS — E785 Hyperlipidemia, unspecified: Secondary | ICD-10-CM | POA: Diagnosis not present

## 2017-05-07 DIAGNOSIS — N189 Chronic kidney disease, unspecified: Secondary | ICD-10-CM | POA: Insufficient documentation

## 2017-05-07 DIAGNOSIS — I82509 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 of unspecified lower extremity: Secondary | ICD-10-CM | POA: Diagnosis not present

## 2017-05-07 DIAGNOSIS — Z7901 Long term (current) use of anticoagulants: Secondary | ICD-10-CM | POA: Insufficient documentation

## 2017-05-07 DIAGNOSIS — I129 Hypertensive chronic kidney disease with stage 1 through stage 4 chronic kidney disease, or unspecified chronic kidney disease: Secondary | ICD-10-CM | POA: Diagnosis not present

## 2017-05-07 DIAGNOSIS — E669 Obesity, unspecified: Secondary | ICD-10-CM | POA: Diagnosis not present

## 2017-05-07 DIAGNOSIS — I4891 Unspecified atrial fibrillation: Secondary | ICD-10-CM | POA: Insufficient documentation

## 2017-05-07 DIAGNOSIS — R112 Nausea with vomiting, unspecified: Principal | ICD-10-CM | POA: Diagnosis present

## 2017-05-07 DIAGNOSIS — Z6830 Body mass index (BMI) 30.0-30.9, adult: Secondary | ICD-10-CM | POA: Diagnosis not present

## 2017-05-07 DIAGNOSIS — Z885 Allergy status to narcotic agent status: Secondary | ICD-10-CM | POA: Diagnosis not present

## 2017-05-07 DIAGNOSIS — I10 Essential (primary) hypertension: Secondary | ICD-10-CM | POA: Diagnosis not present

## 2017-05-07 DIAGNOSIS — L930 Discoid lupus erythematosus: Secondary | ICD-10-CM | POA: Diagnosis not present

## 2017-05-07 DIAGNOSIS — Z4889 Encounter for other specified surgical aftercare: Secondary | ICD-10-CM

## 2017-05-07 LAB — CBC
HCT: 36.7 % — ABNORMAL LOW (ref 40.0–52.0)
Hemoglobin: 12.8 g/dL — ABNORMAL LOW (ref 13.0–18.0)
MCH: 30.8 pg (ref 26.0–34.0)
MCHC: 34.7 g/dL (ref 32.0–36.0)
MCV: 88.6 fL (ref 80.0–100.0)
Platelets: 312 10*3/uL (ref 150–440)
RBC: 4.15 MIL/uL — AB (ref 4.40–5.90)
RDW: 13.5 % (ref 11.5–14.5)
WBC: 7.7 10*3/uL (ref 3.8–10.6)

## 2017-05-07 LAB — COMPREHENSIVE METABOLIC PANEL
ALBUMIN: 3 g/dL — AB (ref 3.5–5.0)
ALK PHOS: 70 U/L (ref 38–126)
ALT: 21 U/L (ref 17–63)
AST: 18 U/L (ref 15–41)
Anion gap: 9 (ref 5–15)
BUN: 44 mg/dL — ABNORMAL HIGH (ref 6–20)
CO2: 21 mmol/L — AB (ref 22–32)
Calcium: 8.8 mg/dL — ABNORMAL LOW (ref 8.9–10.3)
Chloride: 107 mmol/L (ref 101–111)
Creatinine, Ser: 2.69 mg/dL — ABNORMAL HIGH (ref 0.61–1.24)
GFR calc Af Amer: 29 mL/min — ABNORMAL LOW (ref >60)
GFR calc non Af Amer: 25 mL/min — ABNORMAL LOW (ref >60)
GLUCOSE: 100 mg/dL — AB (ref 65–99)
Potassium: 3.9 mmol/L (ref 3.5–5.1)
SODIUM: 137 mmol/L (ref 135–145)
Total Bilirubin: 0.8 mg/dL (ref 0.3–1.2)
Total Protein: 6.9 g/dL (ref 6.5–8.1)

## 2017-05-07 LAB — LIPASE, BLOOD: LIPASE: 35 U/L (ref 11–51)

## 2017-05-07 LAB — TROPONIN I: Troponin I: <0.03 ng/mL (ref <0.03)

## 2017-05-07 MED ORDER — SODIUM CHLORIDE 0.9 % IV SOLN
1000.0000 mL | Freq: Once | INTRAVENOUS | Status: AC
  Administered 2017-05-07: 1000 mL via INTRAVENOUS

## 2017-05-07 MED ORDER — PROMETHAZINE HCL 25 MG/ML IJ SOLN
12.5000 mg | Freq: Once | INTRAMUSCULAR | Status: AC
  Administered 2017-05-07: 12.5 mg via INTRAVENOUS
  Filled 2017-05-07: qty 1

## 2017-05-07 MED ORDER — VANCOMYCIN HCL IN DEXTROSE 1-5 GM/200ML-% IV SOLN: 1000.0000 mg | Freq: Once | INTRAVENOUS | Status: DC

## 2017-05-07 MED ORDER — ONDANSETRON HCL 4 MG/2ML IJ SOLN: 4.0000 mg | Freq: Four times a day (QID) | INTRAMUSCULAR | Status: DC | PRN

## 2017-05-07 MED ORDER — MUPIROCIN 2 % EX OINT: 1.0000 "application " | TOPICAL_OINTMENT | Freq: Two times a day (BID) | CUTANEOUS | Status: DC

## 2017-05-07 MED ORDER — VANCOMYCIN HCL 10 G IV SOLR
1250.0000 mg | INTRAVENOUS | Status: DC
  Administered 2017-05-08: 01:00:00 1250 mg via INTRAVENOUS
  Filled 2017-05-07 (×2): qty 1250

## 2017-05-07 MED ORDER — APIXABAN 5 MG PO TABS
5.0000 mg | ORAL_TABLET | Freq: Two times a day (BID) | ORAL | Status: DC
  Administered 2017-05-07 – 2017-05-09 (×4): 5 mg via ORAL
  Filled 2017-05-07 (×4): qty 1

## 2017-05-07 MED ORDER — DOXYCYCLINE HYCLATE 100 MG PO TABS: 100.0000 mg | ORAL_TABLET | Freq: Two times a day (BID) | ORAL | Status: DC

## 2017-05-07 MED ORDER — DOXYCYCLINE HYCLATE 100 MG IV SOLR
100.0000 mg | Freq: Two times a day (BID) | INTRAVENOUS | Status: DC
  Administered 2017-05-07 – 2017-05-08 (×2): 100 mg via INTRAVENOUS
  Filled 2017-05-07 (×5): qty 100

## 2017-05-07 MED ORDER — ONDANSETRON HCL 4 MG/2ML IJ SOLN
4.0000 mg | Freq: Once | INTRAMUSCULAR | Status: AC
  Administered 2017-05-07: 4 mg via INTRAVENOUS
  Filled 2017-05-07: qty 2

## 2017-05-07 MED ORDER — MYCOPHENOLATE MOFETIL 250 MG PO CAPS
1000.0000 mg | ORAL_CAPSULE | Freq: Two times a day (BID) | ORAL | Status: DC
  Administered 2017-05-07 – 2017-05-09 (×4): 1000 mg via ORAL
  Filled 2017-05-07 (×5): qty 4

## 2017-05-07 MED ORDER — PROMETHAZINE HCL 25 MG/ML IJ SOLN
25.0000 mg | Freq: Four times a day (QID) | INTRAMUSCULAR | Status: DC | PRN
  Administered 2017-05-07 – 2017-05-08 (×2): 25 mg via INTRAVENOUS
  Filled 2017-05-07 (×2): qty 1

## 2017-05-07 MED ORDER — LOSARTAN POTASSIUM 50 MG PO TABS
100.0000 mg | ORAL_TABLET | Freq: Every day | ORAL | Status: DC
  Administered 2017-05-08 – 2017-05-09 (×2): 100 mg via ORAL
  Filled 2017-05-07 (×2): qty 2

## 2017-05-07 MED ORDER — PANTOPRAZOLE SODIUM 40 MG IV SOLR
40.0000 mg | INTRAVENOUS | Status: DC
  Administered 2017-05-07 – 2017-05-08 (×2): 40 mg via INTRAVENOUS
  Filled 2017-05-07 (×2): qty 40

## 2017-05-07 MED ORDER — SODIUM CHLORIDE 0.9 % IV SOLN
INTRAVENOUS | Status: DC
  Administered 2017-05-07 – 2017-05-09 (×3): via INTRAVENOUS

## 2017-05-07 MED ORDER — ONDANSETRON HCL 4 MG PO TABS: 4.0000 mg | ORAL_TABLET | Freq: Four times a day (QID) | ORAL | Status: DC | PRN

## 2017-05-07 MED ORDER — ADULT MULTIVITAMIN W/MINERALS CH
1.0000 | ORAL_TABLET | Freq: Every day | ORAL | Status: DC
  Administered 2017-05-08 – 2017-05-09 (×2): 1 via ORAL
  Filled 2017-05-07 (×2): qty 1

## 2017-05-07 MED ORDER — ROSUVASTATIN CALCIUM 10 MG PO TABS
10.0000 mg | ORAL_TABLET | Freq: Every day | ORAL | Status: DC
  Administered 2017-05-07 – 2017-05-08 (×2): 10 mg via ORAL
  Filled 2017-05-07 (×2): qty 1

## 2017-05-07 MED ORDER — PREDNISONE 10 MG PO TABS
10.0000 mg | ORAL_TABLET | Freq: Every day | ORAL | Status: DC
  Administered 2017-05-08 – 2017-05-09 (×2): 10 mg via ORAL
  Filled 2017-05-07 (×2): qty 1

## 2017-05-07 MED ORDER — VANCOMYCIN HCL 10 G IV SOLR
1250.0000 mg | Freq: Once | INTRAVENOUS | Status: AC
  Administered 2017-05-07: 22:00:00 1250 mg via INTRAVENOUS
  Filled 2017-05-07 (×2): qty 1250

## 2017-05-07 MED ORDER — HEPARIN SODIUM (PORCINE) 5000 UNIT/ML IJ SOLN: 5000.0000 [IU] | Freq: Three times a day (TID) | INTRAMUSCULAR | Status: DC

## 2017-05-07 NOTE — ED Notes (Signed)
Right upper extremity measurements:  Hand  20 cm Wrist 17 cm Forearm 21 cm Bicep 30 cm Axillary tenderness:  No

## 2017-05-07 NOTE — Progress Notes (Signed)
Pharmacy Antibiotic Note  Jonathon Snow is a 56 y.o. male admitted on 05/07/2017 with N/V and wound infection.  Pharmacy has been consulted for Vancomycin dosing. Patient was taking doxycycline prior to admission [facility] 5/13, end date 5/20).   Plan: Ke: 0.037    Vd: 72   T1/2: 19  Will start patient on Vancomycin 1250mg  IV every 24 hours with 6 hour stack dosing. Calculated trough at Css is 14.5. Trough level ordered prior to 4th dose.    Height: 6\' 2"  (188 cm) Weight: 228 lb (103.4 kg) IBW/kg (Calculated) : 82.2  Temp (24hrs), Avg:97.8 F (36.6 C), Min:97.8 F (36.6 C), Max:97.8 F (36.6 C)   Recent Labs Lab 04/30/17 2343 05/01/17 0441 05/02/17 0323 05/03/17 0336 05/07/17 1146  WBC  --  9.0 8.6  --  7.7  CREATININE  --  3.61* 3.36* 3.02* 2.69*  VANCOTROUGH 66*  --   --   --   --     Estimated Creatinine Clearance: 39.8 mL/min (A) (by C-G formula based on SCr of 2.69 mg/dL (H)).    Allergies  Allergen Reactions  . Vicodin [Hydrocodone-Acetaminophen] Hives and Other (See Comments)    Rash/hives Other Reaction: severe headaches Rash/hives Other Reaction: severe headaches Rash/hives  . Hydrocodone Rash  . Oxycodone Rash    Takes occasionally.    Antimicrobials this admission: 5/17 vancomycin  >>  5/13 doxycycline >>   Dose adjustments this admission:  Microbiology results:  Thank you for allowing pharmacy to be a part of this patient's care.  Pernell Dupre, PharmD, BCPS Clinical Pharmacist 05/07/2017 4:17 PM

## 2017-05-07 NOTE — ED Notes (Signed)
I have tried x 2 for iv access/blood draw without success. Patient has multiple bruises from recent admission.   Iv team order placed.
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
## 2017-05-07 NOTE — ED Notes (Signed)
Report called to donna rn floor nurse 

## 2017-05-07 NOTE — H&P (Signed)
[facility] NAME: Daekwon Beswick    MR#:  [id number]  DATE OF BIRTH:  1961-09-14  DATE OF ADMISSION:  05/07/2017  PRIMARY CARE PHYSICIAN: Roselee Nova, MD   REQUESTING/REFERRING PHYSICIAN:   CHIEF COMPLAINT:   Chief Complaint  Patient presents with  . Emesis  . Nausea  . Abdominal Pain    HISTORY OF PRESENT ILLNESS:  Chastin Riesgo  is a 56 y.o. male with a known history of Lupus nephritis, DVT, atrial fibrillation   PAST MEDICAL HISTORY:   Past Medical History:  Diagnosis Date  . DVT (deep venous thrombosis) ([place])   . Hyperlipidemia   . Hypertension   . Lupus   . Pulmonary embolism ([place])   . Renal disorder     PAST SURGICAL HISTOIRY:   Past Surgical History:  Procedure Laterality Date  . ANKLE SURGERY Right   . CYST EXCISION  92 or 93    Liver cyst removal [facility]  . I&D EXTREMITY Right 04/29/2017   Procedure: IRRIGATION AND DEBRIDEMENT EXTREMITY;  Surgeon: Clayburn Pert, MD;  Location: [facility] ORS;  Service: General;  Laterality: Right;  . IRRIGATION AND DEBRIDEMENT ABSCESS Left 04/29/2017   Procedure: IRRIGATION AND DEBRIDEMENT Scrotal ABSCESS;  Surgeon: Clayburn Pert, MD;  Location: [facility] ORS;  Service: General;  Laterality: Left;    SOCIAL HISTORY:   Social History  Substance Use Topics  . Smoking status: Never Smoker  . Smokeless tobacco: Never Used  . Alcohol use No    FAMILY HISTORY:   Family History  Problem Relation Age of Onset  . Hypertension Father     DRUG ALLERGIES:   Allergies  Allergen Reactions  . Vicodin [Hydrocodone-Acetaminophen] Hives and Other (See Comments)    Rash/hives Other Reaction: severe headaches Rash/hives Other Reaction: severe headaches Rash/hives  . Hydrocodone Rash  . Oxycodone Rash    Takes occasionally.    REVIEW OF SYSTEMS:  CONSTITUTIONAL: No fever, fatigue or weakness.  EYES: No blurred or double vision.  EARS, NOSE, AND THROAT: No tinnitus  or ear pain.  RESPIRATORY: No cough, shortness of breath, wheezing or hemoptysis.  CARDIOVASCULAR: No chest pain, orthopnea, edema.  GASTROINTESTINAL: .  GENITOURINARY: No dysuria, hematuria.  ENDOCRINE: No polyuria, nocturia,  HEMATOLOGY: No anemia, easy bruising or bleeding SKIN: No rash or lesion. MUSCULOSKELETAL: No joint pain or arthritis.   NEUROLOGIC: No tingling, numbness, weakness.  PSYCHIATRY: No anxiety or depression.   MEDICATIONS AT HOME:   Prior to Admission medications   Medication Sig Start Date End Date Taking? Authorizing Provider  doxycycline (VIBRA-TABS) 100 MG tablet Take 1 tablet (100 mg total) by mouth every 12 (twelve) hours. 05/03/17 05/10/17 Yes Gouru, Aruna, MD  ELIQUIS 5 MG TABS tablet TAKE 1 TABLET BY MOUTH 2 TIMES DAILY 12/30/16  Yes Cammie Sickle, MD  losartan (COZAAR) 100 MG tablet Take 100 mg by mouth daily. 09/23/15  Yes [provider]  Multiple Vitamins-Minerals (MULTIVITAMIN WITH MINERALS) tablet Take 1 tablet by mouth daily.   Yes [provider]  mycophenolate (CELLCEPT) 500 MG tablet Take 1,000 mg by mouth 2 (two) times daily.   Yes [provider]  prednisoLONE 5 MG TABS tablet Take 10 mg by mouth daily.   Yes [provider]  rosuvastatin (CRESTOR) 10 MG tablet Take 1 tablet (10 mg total) by mouth at bedtime. 03/27/17  Yes Roselee Nova, MD  COMBIVENT RESPIMAT 20-100 MCG/ACT AERS respimat Inhale 1 puff  into the lungs daily as needed. 04/14/17   [provider]  mupirocin ointment (BACTROBAN) 2 % Place 1 application into the nose 2 (two) times daily. Patient not taking: Reported on 05/07/2017 05/03/17   [name], MD  ondansetron (ZOFRAN) 8 MG tablet 8 mg every 8 (eight) hours as needed.  03/04/17   [provider]  traMADol (ULTRAM) 50 MG tablet Take 2 tablets (100 mg total) by mouth every 6 (six) hours as needed for moderate pain. 05/03/17   [name], MD      VITAL SIGNS:  Blood  pressure 127/68, pulse 71, temperature 97.8 F (36.6 C), temperature source Oral, resp. rate 12, height 6\' 2"  (1.88 m), weight 103.4 kg (228 lb), SpO2 98 %.  PHYSICAL EXAMINATION:  GENERAL:  56 y.o.-year-old patient lying in the bed with no acute distress.  EYES: Pupils equal, round, reactive to light and accommodation. No scleral icterus. Extraocular muscles intact.  HEENT: Head atraumatic, normocephalic. Oropharynx and nasopharynx clear.  NECK:  Supple, no jugular venous distention. No thyroid enlargement, no tenderness.  LUNGS: Normal breath sounds bilaterally, no wheezing, rales,rhonchi or crepitation. No use of accessory muscles of respiration.  CARDIOVASCULAR: S1, S2 normal. No murmurs, rubs, or gallops.  ABDOMEN: Soft, nontender, nondistended. Bowel sounds present. No organomegaly or mass.  EXTREMITIES: No pedal edema, cyanosis, or clubbing.  NEUROLOGIC: Cranial nerves II through XII are intact. Muscle strength 5/5 in all extremities. Sensation intact. Gait not checked.  PSYCHIATRIC: The patient is alert and oriented x 3.  SKIN: No obvious rash, lesion, or ulcer.   LABORATORY PANEL:   CBC  Recent Labs Lab 05/07/17 1146  WBC 7.7  HGB 12.8*  HCT 36.7*  PLT 312   ------------------------------------------------------------------------------------------------------------------  Chemistries   Recent Labs Lab 05/07/17 1146  NA 137  K 3.9  CL 107  CO2 21*  GLUCOSE 100*  BUN 44*  CREATININE 2.69*  CALCIUM 8.8*  AST 18  ALT 21  ALKPHOS 70  BILITOT 0.8   ------------------------------------------------------------------------------------------------------------------  Cardiac Enzymes  Recent Labs Lab 05/07/17 1146  TROPONINI <0.03   ------------------------------------------------------------------------------------------------------------------  RADIOLOGY:  No results found.  EKG:   Orders placed or performed during the hospital encounter of 05/07/17  .  EKG 12-Lead  . EKG 12-Lead    IMPRESSION AND PLAN:   56 year old male patient with history of recent right axillary abscess, scrotal abscess status post drainage comes in because of intractable nausea, vomiting for the past 3 days associated with multiple episodes of loose stools. Patient had loose stools yesterday and deep none today. Has mild abdominal pain but he thinks is because of vomiting..   1 .intractable nausea, vomiting likely secondary to acute gastritis with recent antibiotic use. Start IV PPIs, use IV Zofran or Phenergan for nausea, if patient persistently has diarrhea check stool for C. difficile but he says that he is diarrhea resolved today.  #2 recent right axillary abscess status post drainage by surgery a week ago. Followed by surgery today, axillary drain was removed by surgery  In clinic today .patient says that his drainage is improving, has minimal pain in the right axilla. CT is concerning for abscess of the right axilla, reconsult surgery. #3 history of SLE, lupus nephritis: His kidney function is better, continue mycophenolate come prednisolone.  #4 .history of for DVT, PE: Continue full dose  ELiquis.   All the records are reviewed and case discussed with ED provider. Management plans discussed with the patient, family and they are in agreement.  CODE STATUS:full  TOTAL TIME TAKING CARE OF THIS PATIENT:55 minutes.    Epifanio Lesches M.D on 05/07/2017 at 3:37 PM  Between 7am to 6pm - Pager - [phone]  After 6pm go to [email or web address] - password [email or web address] [facility]  [facility]  Office  [phone]  CC: Primary care physician; Roselee Nova, MD  Note: This dictation was prepared with Dragon dictation along with smaller phrase technology. Any transcriptional errors that result from this process are unintentional.

## 2017-05-07 NOTE — Patient Instructions (Signed)
Please wash the area daily with soap and water and apply a clean dressing over the area. Please do not submerge in water until completely healed.  Please call our office if you have questions or concerns.

## 2017-05-07 NOTE — ED Notes (Signed)
Resumed care from [place] rn.  Pt alert.  Iv in place.  Pt continues to have abd pain.  Pt alert.  Speech clear.

## 2017-05-07 NOTE — Progress Notes (Signed)
Outpatient Surgical Follow Up  05/07/2017  URA HAUSEN is an 56 y.o. male.   Chief Complaint  Patient presents with  . Routine Post Op    I&D Right Axillary Abscess-04-29-17-Dr.Yago Ludvigsen    HPI: 56 year old male returns to clinic for follow-up 8 days after an incision and drainage of right axillar abscess. He states the drainage output has been decreasing in improving and character. He continues to have systemic symptoms from his multiple other medical problems. He denies any fevers. He's been having some nausea but no vomiting, denies any diarrhea or constipation.  Past Medical History:  Diagnosis Date  . DVT (deep venous thrombosis) ([place])   . Hyperlipidemia   . Hypertension   . Lupus   . Pulmonary embolism ([place])   . Renal disorder     Past Surgical History:  Procedure Laterality Date  . ANKLE SURGERY Right   . CYST EXCISION  92 or 93    Liver cyst removal [facility]  . I&D EXTREMITY Right 04/29/2017   Procedure: IRRIGATION AND DEBRIDEMENT EXTREMITY;  Surgeon: Clayburn Pert, MD;  Location: [facility] ORS;  Service: General;  Laterality: Right;  . IRRIGATION AND DEBRIDEMENT ABSCESS Left 04/29/2017   Procedure: IRRIGATION AND DEBRIDEMENT Scrotal ABSCESS;  Surgeon: Clayburn Pert, MD;  Location: [facility] ORS;  Service: General;  Laterality: Left;    Family History  Problem Relation Age of Onset  . Hypertension Father     Social History:  reports that he has never smoked. He has never used smokeless tobacco. He reports that he does not drink alcohol. His drug history is not on file.  Allergies:  Allergies  Allergen Reactions  . Vicodin [Hydrocodone-Acetaminophen] Hives and Other (See Comments)    Rash/hives Other Reaction: severe headaches Rash/hives Other Reaction: severe headaches Rash/hives  . Hydrocodone Rash  . Oxycodone Rash    Takes occasionally.    Medications reviewed.    ROS A multipoint review of systems was completed, all pertinent positives and negatives are  documented in the history of present illness and remainder are negative   BP (!) 151/90   Pulse 98   Temp 97.8 F (36.6 C) (Oral)   Ht 6\' 2"  (1.88 m)   Wt 103.8 kg (228 lb 12.8 oz)   BMI 29.38 kg/m   Physical Exam Gen.: No acute distress Chest: Clear to auscultation Heart: Regular rhythm Abdomen: Soft nontender Skin: Right axillary abscess drainage with Penrose in place. No purulent fluid or spreading erythema.    No results found for this or any previous visit (from the past 48 hour(s)). No results found.  Assessment/Plan:  1. Aftercare following surgery 56 year old male status post incision and drainage of right axillar abscess. Drain removed in clinic today without difficulty. Counseled as to signs and symptoms of worsening infection and a follow-up in clinic immediately should they occur. Otherwise he is to continue follow-up with his primary care provider for his numerous chronic medical problems. He will follow-up in clinic on an as-needed basis.     Clayburn Pert, MD FACS General Surgeon  05/07/2017,9:56 AM

## 2017-05-07 NOTE — ED Notes (Signed)
Gave patient some gingerale to see if he can keep it down.

## 2017-05-07 NOTE — ED Notes (Signed)
ivf is finished.  Pt says nausea is same as before med.

## 2017-05-07 NOTE — ED Triage Notes (Signed)
Pt reports discharged from here Sunday after being treated for an abscess under right arm. Pt reports nausea, vomiting and abdominal pain since then.

## 2017-05-08 ENCOUNTER — Observation Stay: Payer: 59

## 2017-05-08 DIAGNOSIS — D649 Anemia, unspecified: Secondary | ICD-10-CM | POA: Diagnosis not present

## 2017-05-08 DIAGNOSIS — L02419 Cutaneous abscess of limb, unspecified: Secondary | ICD-10-CM | POA: Diagnosis not present

## 2017-05-08 DIAGNOSIS — L02411 Cutaneous abscess of right axilla: Secondary | ICD-10-CM

## 2017-05-08 DIAGNOSIS — R112 Nausea with vomiting, unspecified: Secondary | ICD-10-CM | POA: Diagnosis not present

## 2017-05-08 DIAGNOSIS — R140 Abdominal distension (gaseous): Secondary | ICD-10-CM | POA: Diagnosis not present

## 2017-05-08 LAB — CBC
HCT: 29 % — ABNORMAL LOW (ref 40.0–52.0)
Hemoglobin: 9.7 g/dL — ABNORMAL LOW (ref 13.0–18.0)
MCH: 29.7 pg (ref 26.0–34.0)
MCHC: 33.3 g/dL (ref 32.0–36.0)
MCV: 89 fL (ref 80.0–100.0)
PLATELETS: 311 10*3/uL (ref 150–440)
RBC: 3.26 MIL/uL — ABNORMAL LOW (ref 4.40–5.90)
RDW: 13.6 % (ref 11.5–14.5)
WBC: 7.3 10*3/uL (ref 3.8–10.6)

## 2017-05-08 LAB — BASIC METABOLIC PANEL
Anion gap: 6 (ref 5–15)
BUN: 37 mg/dL — ABNORMAL HIGH (ref 6–20)
CO2: 21 mmol/L — ABNORMAL LOW (ref 22–32)
Calcium: 8 mg/dL — ABNORMAL LOW (ref 8.9–10.3)
Chloride: 111 mmol/L (ref 101–111)
Creatinine, Ser: 2.37 mg/dL — ABNORMAL HIGH (ref 0.61–1.24)
GFR calc Af Amer: 34 mL/min — ABNORMAL LOW (ref >60)
GFR calc non Af Amer: 29 mL/min — ABNORMAL LOW (ref >60)
Glucose, Bld: 92 mg/dL (ref 65–99)
Potassium: 3.9 mmol/L (ref 3.5–5.1)
Sodium: 138 mmol/L (ref 135–145)

## 2017-05-08 LAB — GLUCOSE, CAPILLARY: Glucose-Capillary: 105 mg/dL — ABNORMAL HIGH (ref 65–99)

## 2017-05-08 LAB — LACTIC ACID, PLASMA: Lactic Acid, Venous: 1.6 mmol/L (ref 0.5–1.9)

## 2017-05-08 MED ORDER — METOCLOPRAMIDE HCL 5 MG/ML IJ SOLN
5.0000 mg | Freq: Three times a day (TID) | INTRAMUSCULAR | Status: DC
  Administered 2017-05-08 – 2017-05-09 (×4): 5 mg via INTRAVENOUS
  Filled 2017-05-08 (×4): qty 2

## 2017-05-08 MED ORDER — PROMETHAZINE HCL 25 MG/ML IJ SOLN
12.5000 mg | INTRAMUSCULAR | Status: DC | PRN
  Administered 2017-05-08: 11:00:00 12.5 mg via INTRAVENOUS
  Filled 2017-05-08: qty 1

## 2017-05-08 MED ORDER — DOXYCYCLINE HYCLATE 100 MG PO TABS: 100.0000 mg | ORAL_TABLET | Freq: Two times a day (BID) | ORAL | Status: DC

## 2017-05-08 NOTE — Consult Note (Signed)
Surgical Consultation  05/08/2017  Jonathon Snow is an 56 y.o. male.   CC: Right axillary wound  HPI: This patient who had a right axillary abscess that was drained by Dr. Adonis Huguenin on May 9. He was seen in the office yesterday the 17th and a Penrose drain was removed and it was recommended that he follow up on an as needed basis.  Patient was admitted the hospital with nausea and vomiting and ill feeling. He has had no increase in pain in his right axilla he has no pain there now whatsoever and feels well concerning his prior abscess.  I was asked see the patient for signs of "recurrent right axillary abscess" with is the suggestion in the prime doc note that this was a recurrence seen on CT scan. Recent CT scan that I can identify in that the patient admits to was done on May 7 he has not had a CT since then. No pain in his right axilla.  Past Medical History:  Diagnosis Date  . DVT (deep venous thrombosis) ([place])   . Hyperlipidemia   . Hypertension   . Lupus   . Pulmonary embolism ([place])   . Renal disorder     Past Surgical History:  Procedure Laterality Date  . ANKLE SURGERY Right   . CYST EXCISION  92 or 93    Liver cyst removal [facility]  . I&D EXTREMITY Right 04/29/2017   Procedure: IRRIGATION AND DEBRIDEMENT EXTREMITY;  Surgeon: Clayburn Pert, MD;  Location: [facility] ORS;  Service: General;  Laterality: Right;  . IRRIGATION AND DEBRIDEMENT ABSCESS Left 04/29/2017   Procedure: IRRIGATION AND DEBRIDEMENT Scrotal ABSCESS;  Surgeon: Clayburn Pert, MD;  Location: [facility] ORS;  Service: General;  Laterality: Left;    Family History  Problem Relation Age of Onset  . Hypertension Father     Social History:  reports that he has never smoked. He has never used smokeless tobacco. He reports that he does not drink alcohol. His drug history is not on file.  Allergies:  Allergies  Allergen Reactions  . Vicodin [Hydrocodone-Acetaminophen] Hives and Other (See Comments)    Rash/hives Other  Reaction: severe headaches Rash/hives Other Reaction: severe headaches Rash/hives  . Hydrocodone Rash  . Oxycodone Rash    Takes occasionally.    Medications reviewed.   Review of Systems:   Review of Systems  Constitutional: Negative for chills and fever.  Skin: Negative.      Physical Exam:  BP 135/73 (BP Location: Left Arm)   Pulse 75   Temp 98.2 F (36.8 C) (Oral)   Resp 16   Ht 6' 2"  (1.88 m)   Wt 233 lb 12.8 oz (106.1 kg)   SpO2 99%   BMI 30.02 kg/m   Physical Exam  Constitutional: He is oriented to person, place, and time and well-developed, well-nourished, and in no distress. No distress.  Musculoskeletal: Normal range of motion. He exhibits no edema.  Right axillary wounds which were closed with no fluid collection palpable no erythema no drainage no tenderness in the right axilla no mass  Neurological: He is alert and oriented to person, place, and time.  Skin: Skin is warm and dry. No rash noted. He is not diaphoretic. No erythema.  Vitals reviewed.     Results for orders placed or performed during the hospital encounter of 05/07/17 (from the past 48 hour(s))  CBC     Status: Abnormal   Collection Time: 05/07/17 11:46 AM  Result Value Ref Range  WBC 7.7 3.8 - 10.6 K/uL   RBC 4.15 (L) 4.40 - 5.90 MIL/uL   Hemoglobin 12.8 (L) 13.0 - 18.0 g/dL   HCT 36.7 (L) 40.0 - 52.0 %   MCV 88.6 80.0 - 100.0 fL   MCH 30.8 26.0 - 34.0 pg   MCHC 34.7 32.0 - 36.0 g/dL   RDW 13.5 11.5 - 14.5 %   Platelets 312 150 - 440 K/uL  Comprehensive metabolic panel     Status: Abnormal   Collection Time: 05/07/17 11:46 AM  Result Value Ref Range   Sodium 137 135 - 145 mmol/L   Potassium 3.9 3.5 - 5.1 mmol/L   Chloride 107 101 - 111 mmol/L   CO2 21 (L) 22 - 32 mmol/L   Glucose, Bld 100 (H) 65 - 99 mg/dL   BUN 44 (H) 6 - 20 mg/dL   Creatinine, Ser 2.69 (H) 0.61 - 1.24 mg/dL   Calcium 8.8 (L) 8.9 - 10.3 mg/dL   Total Protein 6.9 6.5 - 8.1 g/dL   Albumin 3.0 (L) 3.5 -  5.0 g/dL   AST 18 15 - 41 U/L   ALT 21 17 - 63 U/L   Alkaline Phosphatase 70 38 - 126 U/L   Total Bilirubin 0.8 0.3 - 1.2 mg/dL   GFR calc non Af Amer 25 (L) >60 mL/min   GFR calc Af Amer 29 (L) >60 mL/min    Comment: (NOTE) The eGFR has been calculated using the CKD EPI equation. This calculation has not been validated in all clinical situations. eGFR's persistently <60 mL/min signify possible Chronic Kidney Disease.    Anion gap 9 5 - 15  Troponin I     Status: None   Collection Time: 05/07/17 11:46 AM  Result Value Ref Range   Troponin I <0.03 <0.03 ng/mL  Lipase, blood     Status: None   Collection Time: 05/07/17 11:46 AM  Result Value Ref Range   Lipase 35 11 - 51 U/L  Basic metabolic panel     Status: Abnormal   Collection Time: 05/08/17  4:19 AM  Result Value Ref Range   Sodium 138 135 - 145 mmol/L   Potassium 3.9 3.5 - 5.1 mmol/L   Chloride 111 101 - 111 mmol/L   CO2 21 (L) 22 - 32 mmol/L   Glucose, Bld 92 65 - 99 mg/dL   BUN 37 (H) 6 - 20 mg/dL   Creatinine, Ser 2.37 (H) 0.61 - 1.24 mg/dL   Calcium 8.0 (L) 8.9 - 10.3 mg/dL   GFR calc non Af Amer 29 (L) >60 mL/min   GFR calc Af Amer 34 (L) >60 mL/min    Comment: (NOTE) The eGFR has been calculated using the CKD EPI equation. This calculation has not been validated in all clinical situations. eGFR's persistently <60 mL/min signify possible Chronic Kidney Disease.    Anion gap 6 5 - 15  CBC     Status: Abnormal   Collection Time: 05/08/17  4:19 AM  Result Value Ref Range   WBC 7.3 3.8 - 10.6 K/uL   RBC 3.26 (L) 4.40 - 5.90 MIL/uL   Hemoglobin 9.7 (L) 13.0 - 18.0 g/dL   HCT 29.0 (L) 40.0 - 52.0 %   MCV 89.0 80.0 - 100.0 fL   MCH 29.7 26.0 - 34.0 pg   MCHC 33.3 32.0 - 36.0 g/dL   RDW 13.6 11.5 - 14.5 %   Platelets 311 150 - 440 K/uL  Glucose, capillary  Status: Abnormal   Collection Time: 05/08/17  7:38 AM  Result Value Ref Range   Glucose-Capillary 105 (H) 65 - 99 mg/dL   No results  found.  Assessment/Plan:  This a patient who had an I and D of a axillary abscess on May 9 prior to that on May 7 the patient had an abs cysts finding on a CT scan. Subsequent to that CT scan the drainage was performed and then the patient was seen in the office yesterday where the drainage catheter or drain was removed. A she has no clinical signs of recurrent abscess nor does he have any pain etc. There is no reason for any follow-up in fact I took the dressing off today and he does not need any further surgical intervention. We'll sign off  Florene Glen, MD, FACS

## 2017-05-08 NOTE — Progress Notes (Signed)
[facility] [place] NAME: Jonathon Snow    MR#:  [id number]  DATE OF BIRTH:  January 04, 1961  SUBJECTIVE:  CHIEF COMPLAINT:   Chief Complaint  Patient presents with  . Emesis  . Nausea  . 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 Adonis Huguenin 04/29/2017, he was seen by surgery on the 17th and had Penrose drain removed, however, now he presents to emergency room with nausea and vomiting, also some episodes of loose stools. He was having abdominal pain all over the abdomen and still does not feel well despite numerous medication doses, IV fluids  Review of Systems  Constitutional: Negative for chills, fever and weight loss.  HENT: Negative for congestion.   Eyes: Negative for blurred vision and double vision.  Respiratory: Negative for cough, sputum production, shortness of breath and wheezing.   Cardiovascular: Negative for chest pain, palpitations, orthopnea, leg swelling and PND.  Gastrointestinal: Positive for abdominal pain, nausea and vomiting. Negative for blood in stool, constipation and diarrhea.  Genitourinary: Negative for dysuria, frequency, hematuria and urgency.  Musculoskeletal: Negative for falls.  Neurological: Negative for dizziness, tremors, focal weakness and headaches.  Endo/Heme/Allergies: Does not bruise/bleed easily.  Psychiatric/Behavioral: Negative for depression. The patient does not have insomnia.     VITAL SIGNS: Blood pressure 135/73, pulse 75, temperature 98.2 F (36.8 C), temperature source Oral, resp. rate 16, height 6\' 2"  (1.88 m), weight 106.1 kg (233 lb 12.8 oz), SpO2 99 %.  PHYSICAL EXAMINATION:   GENERAL:  55 y.o.-year-old patient lying in the bed in moderate distress due to nausea and some discomfort in abdomen.  EYES: Pupils equal,  round, reactive to light and accommodation. No scleral icterus. Extraocular muscles intact.  HEENT: Head atraumatic, normocephalic. Oropharynx and nasopharynx clear.  NECK:  Supple, no jugular venous distention. No thyroid enlargement, no tenderness.  LUNGS: Normal breath sounds bilaterally, no wheezing, rales,rhonchi or crepitation. No use of accessory muscles of respiration.  CARDIOVASCULAR: S1, S2 normal. No murmurs, rubs, or gallops.  ABDOMEN: Soft, nondistended, diffusely on palpation but no rebound or guarding was noted, slight distention. Bowel sounds diminished. No organomegaly or mass.  EXTREMITIES: No pedal edema, cyanosis, or clubbing.  NEUROLOGIC: Cranial nerves II through XII are intact. Muscle strength 5/5 in all extremities. Sensation intact. Gait not checked.  PSYCHIATRIC: The patient is alert and oriented x 3.  SKIN: No obvious rash, lesion, or ulcer. Right axilla abscess area is now swollen, no drainage was noted. Healing scar. No fluctuations. No swelling underneath of the skin  ORDERS/RESULTS REVIEWED:   CBC  Recent Labs Lab 05/02/17 0323 05/07/17 1146 05/08/17 0419  WBC 8.6 7.7 7.3  HGB 10.4* 12.8* 9.7*  HCT 30.3* 36.7* 29.0*  PLT 335 312 311  MCV 89.5 88.6 89.0  MCH 30.8 30.8 29.7  MCHC 34.4 34.7 33.3  RDW 13.4 13.5 13.6   ------------------------------------------------------------------------------------------------------------------  Chemistries   Recent Labs Lab 05/02/17 0323 05/03/17 0336 05/07/17 1146 05/08/17 0419  NA 136 137 137 138  K 4.6 4.2 3.9 3.9  CL 100* 105 107 111  CO2 24 23 21* 21*  GLUCOSE 95 109* 100* 92  BUN 68* 65* 44* 37*  CREATININE 3.36* 3.02* 2.69* 2.37*  CALCIUM 9.0 8.7* 8.8* 8.0*  AST  --   --  18  --  ALT  --   --  21  --   ALKPHOS  --   --  70  --   BILITOT  --   --  0.8  --    ------------------------------------------------------------------------------------------------------------------ estimated creatinine  clearance is 45.7 mL/min (A) (by C-G formula based on SCr of 2.37 mg/dL (H)). ------------------------------------------------------------------------------------------------------------------ No results for input(s): TSH, T4TOTAL, T3FREE, THYROIDAB in the last 72 hours.  Invalid input(s): FREET3  Cardiac Enzymes  Recent Labs Lab 05/07/17 1146  TROPONINI <0.03   ------------------------------------------------------------------------------------------------------------------ Invalid input(s): POCBNP ---------------------------------------------------------------------------------------------------------------  RADIOLOGY: No results found.  EKG:  Orders placed or performed during the hospital encounter of 05/07/17  . EKG 12-Lead  . EKG 12-Lead    ASSESSMENT AND PLAN:  Active Problems:   Intractable nausea and vomiting  #1. Intractable nausea and vomiting, get KUB, continue patient on current medications, initiate nothing by mouth if obstruction/ileus, supportive therapy with IV fluids #2 CK D stage III, seems to be stable with IV fluid administration, following closely #3 . Right axilla abscess, healed, appreciate surgery input, discontinue the doxycycline and vancomycin #4 anemia, follow with rehydration, seems to be stable since 05/01/2017 Management plans discussed with the patient, family and they are in agreement.   DRUG ALLERGIES:  Allergies  Allergen Reactions  . Vicodin [Hydrocodone-Acetaminophen] Hives and Other (See Comments)    Rash/hives Other Reaction: severe headaches Rash/hives Other Reaction: severe headaches Rash/hives  . Hydrocodone Rash  . Oxycodone Rash    Takes occasionally.    CODE STATUS:     Code Status Orders        Start     Ordered   05/07/17 1524  Full code  Continuous     05/07/17 1524    Code Status History    Date Active Date Inactive Code Status Order ID Comments User Context   04/27/2017  6:47 PM 05/03/2017  8:12 PM Full  Code [id number]  Vaughan Basta, MD Inpatient      TOTAL TIME TAKING CARE OF THIS PATIENT: 40 minutes.    Theodoro Grist M.D on 05/08/2017 at 2:24 PM  Between 7am to 6pm - Pager - [phone]  After 6pm go to [email or web address] - password [email or web address] [facility]  [facility]  Office  [phone]  CC: Primary care physician; Roselee Nova, MD

## 2017-05-08 NOTE — Progress Notes (Signed)
PHARMACIST - PHYSICIAN COMMUNICATION DR:   Ether Griffins CONCERNING: Antibiotic IV to Oral Route Change Policy  RECOMMENDATION: This patient is receiving doxycycline by the intravenous route.  Based on criteria approved by the Pharmacy and Therapeutics Committee, the antibiotic(s) is/are being converted to the equivalent oral dose form(s).   DESCRIPTION: These criteria include:  Patient being treated for a respiratory tract infection, urinary tract infection, cellulitis or clostridium difficile associated diarrhea if on metronidazole  The patient is not neutropenic and does not exhibit a GI malabsorption state  The patient is eating (either orally or via tube) and/or has been taking other orally administered medications for a least 24 hours  The patient is improving clinically and has a Tmax < 100.5  If you have questions about this conversion, please contact the [facility], PharmD 05/08/17 10:32 AM

## 2017-05-08 NOTE — Care Management (Signed)
Patient placed in observation for nausea vomiting and abdominal pain.  he is currently followed by [facility].  notified agency patient has been placed in observation.

## 2017-05-09 DIAGNOSIS — R112 Nausea with vomiting, unspecified: Secondary | ICD-10-CM | POA: Diagnosis not present

## 2017-05-09 DIAGNOSIS — N183 Chronic kidney disease, stage 3 (moderate): Secondary | ICD-10-CM | POA: Diagnosis not present

## 2017-05-09 DIAGNOSIS — D649 Anemia, unspecified: Secondary | ICD-10-CM | POA: Diagnosis not present

## 2017-05-09 LAB — COMPREHENSIVE METABOLIC PANEL
ALBUMIN: 2.5 g/dL — AB (ref 3.5–5.0)
ALT: 16 U/L — ABNORMAL LOW (ref 17–63)
AST: 17 U/L (ref 15–41)
Alkaline Phosphatase: 64 U/L (ref 38–126)
Anion gap: 7 (ref 5–15)
BUN: 32 mg/dL — AB (ref 6–20)
CHLORIDE: 110 mmol/L (ref 101–111)
CO2: 23 mmol/L (ref 22–32)
Calcium: 8.2 mg/dL — ABNORMAL LOW (ref 8.9–10.3)
Creatinine, Ser: 2.32 mg/dL — ABNORMAL HIGH (ref 0.61–1.24)
GFR calc Af Amer: 35 mL/min — ABNORMAL LOW (ref >60)
GFR calc non Af Amer: 30 mL/min — ABNORMAL LOW (ref >60)
GLUCOSE: 86 mg/dL (ref 65–99)
POTASSIUM: 4 mmol/L (ref 3.5–5.1)
SODIUM: 140 mmol/L (ref 135–145)
Total Bilirubin: 0.7 mg/dL (ref 0.3–1.2)
Total Protein: 5.5 g/dL — ABNORMAL LOW (ref 6.5–8.1)

## 2017-05-09 LAB — CBC
HCT: 28.9 % — ABNORMAL LOW (ref 40.0–52.0)
Hemoglobin: 9.7 g/dL — ABNORMAL LOW (ref 13.0–18.0)
MCH: 29.8 pg (ref 26.0–34.0)
MCHC: 33.6 g/dL (ref 32.0–36.0)
MCV: 88.7 fL (ref 80.0–100.0)
PLATELETS: 309 10*3/uL (ref 150–440)
RBC: 3.26 MIL/uL — ABNORMAL LOW (ref 4.40–5.90)
RDW: 13.3 % (ref 11.5–14.5)
WBC: 6.8 10*3/uL (ref 3.8–10.6)

## 2017-05-09 LAB — GLUCOSE, CAPILLARY: GLUCOSE-CAPILLARY: 86 mg/dL (ref 65–99)

## 2017-05-09 MED ORDER — PROMETHAZINE HCL 50 MG PO TABS: 25.0000 mg | ORAL_TABLET | Freq: Four times a day (QID) | ORAL | 0 refills | Status: DC | PRN

## 2017-05-09 NOTE — Care Management Note (Signed)
Case Management Note  Patient Details  Name: [name] MRN: [id number] Date of Birth: 06/27/1961  Subjective/Objective:             Discharged with a resumption of care order for [facility], RN.  Ardeen Fillers is aware.      Action/Plan:   Expected Discharge Date:  05/09/17               Expected Discharge Plan:     In-House Referral:     Discharge planning Services     Post Acute Care Choice:    Choice offered to:     DME Arranged:    DME Agency:     HH Arranged:    HH Agency:     Status of Service:     If discussed at [employer], dates discussed:    Additional Comments:  Logann Whitebread A, RN 05/09/2017, 12:45 PM

## 2017-05-12 ENCOUNTER — Encounter: Payer: Self-pay | Admitting: Family Medicine

## 2017-05-12 ENCOUNTER — Ambulatory Visit (INDEPENDENT_AMBULATORY_CARE_PROVIDER_SITE_OTHER): Payer: 59 | Admitting: Family Medicine

## 2017-05-12 VITALS — BP 134/76 | HR 129 | Temp 97.6°F | Resp 17 | Ht 74.0 in | Wt 231.6 lb

## 2017-05-12 DIAGNOSIS — K2900 Acute gastritis without bleeding: Secondary | ICD-10-CM

## 2017-05-12 DIAGNOSIS — N492 Inflammatory disorders of scrotum: Secondary | ICD-10-CM | POA: Diagnosis not present

## 2017-05-12 DIAGNOSIS — R112 Nausea with vomiting, unspecified: Secondary | ICD-10-CM | POA: Diagnosis not present

## 2017-05-12 DIAGNOSIS — L02411 Cutaneous abscess of right axilla: Secondary | ICD-10-CM | POA: Diagnosis not present

## 2017-05-12 DIAGNOSIS — B9562 Methicillin resistant Staphylococcus aureus infection as the cause of diseases classified elsewhere: Secondary | ICD-10-CM | POA: Diagnosis not present

## 2017-05-12 DIAGNOSIS — R197 Diarrhea, unspecified: Secondary | ICD-10-CM

## 2017-05-12 LAB — COMPLETE METABOLIC PANEL WITH GFR
ALBUMIN: 2.9 g/dL — AB (ref 3.6–5.1)
ALK PHOS: 92 U/L (ref 40–115)
ALT: 14 U/L (ref 9–46)
AST: 13 U/L (ref 10–35)
BUN: 32 mg/dL — ABNORMAL HIGH (ref 7–25)
CHLORIDE: 106 mmol/L (ref 98–110)
CO2: 17 mmol/L — ABNORMAL LOW (ref 20–31)
Calcium: 8.1 mg/dL — ABNORMAL LOW (ref 8.6–10.3)
Creat: 2.5 mg/dL — ABNORMAL HIGH (ref 0.70–1.33)
GFR, EST NON AFRICAN AMERICAN: 28 mL/min — AB (ref >=60)
GFR, Est African American: 32 mL/min — ABNORMAL LOW (ref >=60)
GLUCOSE: 124 mg/dL — AB (ref 65–99)
POTASSIUM: 3.8 mmol/L (ref 3.5–5.3)
SODIUM: 138 mmol/L (ref 135–146)
Total Bilirubin: 0.5 mg/dL (ref 0.2–1.2)
Total Protein: 5.7 g/dL — ABNORMAL LOW (ref 6.1–8.1)

## 2017-05-12 MED ORDER — PROMETHAZINE HCL 50 MG PO TABS: 25.0000 mg | ORAL_TABLET | Freq: Four times a day (QID) | ORAL | 0 refills | Status: DC | PRN

## 2017-05-12 MED ORDER — PANTOPRAZOLE SODIUM 40 MG PO TBEC: 40.0000 mg | DELAYED_RELEASE_TABLET | Freq: Every day | ORAL | 0 refills | Status: DC

## 2017-05-12 NOTE — Progress Notes (Signed)
Name: Jonathon Snow   MRN: [id number]    DOB: 01-14-1961   Date:05/12/2017       Progress Note  Subjective  Chief Complaint  Chief Complaint  Patient presents with  . Hospitalization Follow-up    Nausea / Vomiting    HPI  Pt. Presents for follow up from hospital where he was admitted for intractable nausea and vomiting, this was thought to be secondary to gastritis and antibiotic use for scrotal and axillary abscess. He has been on PPI and IV Zofran which helps to some extent but not completely resolves the problem.  He has been having nausea every day, has vomited once or twice in the last few days, has passed loose stools twice in the last day or so. He denies any blood in stool or vomit, no abdominal pain, but feels queasy in the stomach, appetite is not as good as before.    Past Medical History:  Diagnosis Date  . DVT (deep venous thrombosis) ([place])   . Hyperlipidemia   . Hypertension   . Lupus   . Pulmonary embolism ([place])   . Renal disorder     Past Surgical History:  Procedure Laterality Date  . ANKLE SURGERY Right   . CYST EXCISION  92 or 93    Liver cyst removal [facility]  . I&D EXTREMITY Right 04/29/2017   Procedure: IRRIGATION AND DEBRIDEMENT EXTREMITY;  Surgeon: Clayburn Pert, MD;  Location: [facility] ORS;  Service: General;  Laterality: Right;  . IRRIGATION AND DEBRIDEMENT ABSCESS Left 04/29/2017   Procedure: IRRIGATION AND DEBRIDEMENT Scrotal ABSCESS;  Surgeon: Clayburn Pert, MD;  Location: [facility] ORS;  Service: General;  Laterality: Left;    Family History  Problem Relation Age of Onset  . Hypertension Father     Social History   Social History  . Marital status: Divorced    Spouse name: N/A  . Number of children: N/A  . Years of education: N/A   Occupational History  . Not on file.   Social History Main Topics  . Smoking status: Never Smoker  . Smokeless tobacco: Never Used  . Alcohol use No  . Drug use: Unknown  . Sexual activity: Not on file    Other Topics Concern  . Not on file   Social History Narrative  . No narrative on file     Current Outpatient Prescriptions:  .  COMBIVENT RESPIMAT 20-100 MCG/ACT AERS respimat, Inhale 1 puff into the lungs daily as needed., Disp: , Rfl:  .  ELIQUIS 5 MG TABS tablet, TAKE 1 TABLET BY MOUTH 2 TIMES DAILY, Disp: 60 tablet, Rfl: 5 .  losartan (COZAAR) 100 MG tablet, Take 100 mg by mouth daily., Disp: , Rfl: 6 .  Multiple Vitamins-Minerals (MULTIVITAMIN WITH MINERALS) tablet, Take 1 tablet by mouth daily., Disp: , Rfl:  .  mupirocin ointment (BACTROBAN) 2 %, Place 1 application into the nose 2 (two) times daily., Disp: 22 g, Rfl: 0 .  mycophenolate (CELLCEPT) 500 MG tablet, Take 1,000 mg by mouth 2 (two) times daily., Disp: , Rfl:  .  prednisoLONE 5 MG TABS tablet, Take 10 mg by mouth daily., Disp: , Rfl:  .  promethazine (PHENERGAN) 50 MG tablet, Take 0.5 tablets (25 mg total) by mouth every 6 (six) hours as needed for nausea or vomiting., Disp: 30 tablet, Rfl: 0 .  rosuvastatin (CRESTOR) 10 MG tablet, Take 1 tablet (10 mg total) by mouth at bedtime., Disp: 90 tablet, Rfl: 0 .  ondansetron (ZOFRAN) 8 MG  tablet, 8 mg every 8 (eight) hours as needed. , Disp: , Rfl:  .  traMADol (ULTRAM) 50 MG tablet, Take 2 tablets (100 mg total) by mouth every 6 (six) hours as needed for moderate pain. (Patient not taking: Reported on 05/12/2017), Disp: 30 tablet, Rfl: 0  Allergies  Allergen Reactions  . Vicodin [Hydrocodone-Acetaminophen] Hives and Other (See Comments)    Rash/hives Other Reaction: severe headaches Rash/hives Other Reaction: severe headaches Rash/hives  . Hydrocodone Rash  . Oxycodone Rash    Takes occasionally.     ROS  Please see history of present illness for complete discussion of ROS  Objective  Vitals:   05/12/17 1057  BP: 134/76  Pulse: (!) 129  Resp: 17  Temp: 97.6 F (36.4 C)  TempSrc: Oral  SpO2: 98%  Weight: 231 lb 9.6 oz (105.1 kg)  Height: 6\' 2"  (1.88  m)    Physical Exam  Constitutional: He is well-developed, well-nourished, and in no distress.  Cardiovascular: Regular rhythm, S1 normal, S2 normal and normal heart sounds.  Tachycardia present.   No murmur heard. Pulmonary/Chest: Effort normal and breath sounds normal. No respiratory distress. He has no wheezes. He has no rhonchi.  Abdominal: Soft. Bowel sounds are normal. There is no tenderness.  Psychiatric: Mood, memory, affect and judgment normal.  Nursing note and vitals reviewed.   Assessment & Plan  1. Nausea and vomiting, intractability of vomiting not specified, unspecified vomiting type Likely because of gastritis, continue on Phenergan, expect improvement in nausea and vomiting as gastritis is treated - COMPLETE METABOLIC PANEL WITH GFR - promethazine (PHENERGAN) 50 MG tablet; Take 0.5 tablets (25 mg total) by mouth every 6 (six) hours as needed for nausea or vomiting.  Dispense: 30 tablet; Refill: 0  2. Other acute gastritis without hemorrhage  - pantoprazole (PROTONIX) 40 MG tablet; Take 1 tablet (40 mg total) by mouth daily.  Dispense: 30 tablet; Refill: 0  3. Diarrhea, unspecified type  - Stool C-Diff Toxin Assay   Ivyanna Sibert [name] 05/12/2017 11:02 AM

## 2017-05-14 DIAGNOSIS — I10 Essential (primary) hypertension: Secondary | ICD-10-CM | POA: Diagnosis not present

## 2017-05-14 DIAGNOSIS — D631 Anemia in chronic kidney disease: Secondary | ICD-10-CM | POA: Diagnosis not present

## 2017-05-14 DIAGNOSIS — M3214 Glomerular disease in systemic lupus erythematosus: Secondary | ICD-10-CM | POA: Diagnosis not present

## 2017-05-14 DIAGNOSIS — R809 Proteinuria, unspecified: Secondary | ICD-10-CM | POA: Diagnosis not present

## 2017-05-14 NOTE — Discharge Summary (Signed)
Jonathon Snow, is a 56 y.o. male  DOB [birth year]  MRN [id number].  Admission date:  05/07/2017  Admitting Physician  Epifanio Lesches, MD  Discharge Date:  05/09/2017   Primary MD  Roselee Nova, MD  Recommendations for primary care physician for things to follow:  Follow with PMD in  week   Admission Diagnosis  Intractable vomiting with nausea, unspecified vomiting type [R11.2]   Discharge Diagnosis  Intractable vomiting with nausea, unspecified vomiting type [R11.2]   Active Problems:   Nausea and vomiting      Past Medical History:  Diagnosis Date  . DVT (deep venous thrombosis) ([place])   . Hyperlipidemia   . Hypertension   . Lupus   . Pulmonary embolism ([place])   . Renal disorder     Past Surgical History:  Procedure Laterality Date  . ANKLE SURGERY Right   . CYST EXCISION  92 or 93    Liver cyst removal [facility]  . I&D EXTREMITY Right 04/29/2017   Procedure: IRRIGATION AND DEBRIDEMENT EXTREMITY;  Surgeon: Clayburn Pert, MD;  Location: [facility] ORS;  Service: General;  Laterality: Right;  . IRRIGATION AND DEBRIDEMENT ABSCESS Left 04/29/2017   Procedure: IRRIGATION AND DEBRIDEMENT Scrotal ABSCESS;  Surgeon: Clayburn Pert, MD;  Location: [facility] ORS;  Service: General;  Laterality: Left;       History of present illness and  Hospital Course:     Kindly see H&P for history of present illness and admission details, please review complete Labs, Consult reports and Test reports for all details in brief  HPI from the history and physical done on the day of admission 56 yr old male with DVT,HLP,htn ,lupus,PE admitted for Intractable Nausea,Vomitig.and loose stool.   Hospital Course     1 .intractable nausea, vomiting likely secondary to acute gastritis with recent antibiotic use. Started IV PPIs, used IV  Zofran  And  Phenergan for nausea, symptoms improved well with conservative management,  #2 recent right axillary abscess status post drainage by surgery a week ago. Followed by surgery  On the day of admission,, axillary drain was removed by surgery  In clinic  On day admission.. #3 history of SLE, lupus nephritis: , continued mycophenolate come prednisolone.  #4 .history of for DVT, PE: Continue full dose  ELiquis.     Discharge Condition: stable   Follow UP  Follow-up Information    Roselee Nova, MD Follow up in 1 week(s).   Specialty:  Family Medicine Contact information: [address] [place] [address] [phone]             Discharge Instructions  and  Discharge Medications   Elizebeth Koller course of doxycycline that was prescribed.   Allergies as of 05/09/2017      Reactions   Vicodin [hydrocodone-acetaminophen] Hives, Other (See Comments)   Rash/hives Other Reaction: severe headaches Rash/hives Other Reaction: severe headaches Rash/hives   Hydrocodone Rash   Oxycodone Rash   Takes occasionally.      Medication List    TAKE these medications   COMBIVENT RESPIMAT 20-100 MCG/ACT Aers respimat Generic drug:  Ipratropium-Albuterol Inhale 1 puff into the lungs daily as needed.   ELIQUIS 5 MG Tabs tablet Generic drug:  apixaban TAKE 1 TABLET BY MOUTH 2 TIMES DAILY   losartan 100 MG tablet Commonly known as:  COZAAR Take 100 mg by mouth daily.   multivitamin with minerals tablet Take 1 tablet by mouth daily.   mupirocin ointment 2 %  Commonly known as:  BACTROBAN Place 1 application into the nose 2 (two) times daily.   mycophenolate 500 MG tablet Commonly known as:  CELLCEPT Take 1,000 mg by mouth 2 (two) times daily.   ondansetron 8 MG tablet Commonly known as:  ZOFRAN 8 mg every 8 (eight) hours as needed.   prednisoLONE 5 MG Tabs tablet Take 10 mg by mouth daily.   rosuvastatin 10 MG tablet Commonly known as:   CRESTOR Take 1 tablet (10 mg total) by mouth at bedtime.   traMADol 50 MG tablet Commonly known as:  ULTRAM Take 2 tablets (100 mg total) by mouth every 6 (six) hours as needed for moderate pain.     ASK your doctor about these medications   doxycycline 100 MG tablet Commonly known as:  VIBRA-TABS Take 1 tablet (100 mg total) by mouth every 12 (twelve) hours. Ask about: Should I take this medication?         Diet and Activity recommendation: See Discharge Instructions above   Consults obtained - surgery   Major procedures and Radiology Reports - PLEASE review detailed and final reports for all details, in brief -     Dg Abd 1 View  Result Date: 05/08/2017 CLINICAL DATA:  Intractable nausea and vomiting EXAM: ABDOMEN - 1 VIEW COMPARISON:  03/11/2014 abdominal CT FINDINGS: Normal bowel gas pattern. No abnormal stool retention or rectal impaction. IVC filter. No acute osseous finding. IMPRESSION: Normal bowel gas pattern. Electronically Signed   By: Monte Fantasia M.D.   On: 05/08/2017 14:55   Ct Chest Wo Contrast  Result Date: 04/27/2017 CLINICAL DATA:  Worsening dyspnea. 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 EXAM: CT CHEST WITHOUT CONTRAST TECHNIQUE: Multidetector CT imaging of the chest was performed following the standard protocol without IV contrast. COMPARISON:  10/01/2015 chest CT angiogram. FINDINGS: Cardiovascular: Normal heart size. No significant pericardial fluid/thickening. Left anterior descending, left circumflex and right coronary atherosclerosis. Great vessels are normal in course and caliber. Mediastinum/Nodes: No discrete thyroid nodules. Unremarkable esophagus. There is focal skin thickening in the right axilla. There are multiple newly enlarged lymph nodes in the right axilla, largest measuring 2.1 cm short axis (series 2/ image 58). There is associated extensive fat stranding throughout the right axilla. An irregular 4.3 x 2.4 cm fluid density collection in the  deep subcutaneous right axillary region (series 2/image 50) is new. No left axillary adenopathy. No pathologically enlarged mediastinal or gross hilar nodes on this noncontrast scan. Lungs/Pleura: No pneumothorax. No pleural effusion. No acute consolidative airspace disease, lung masses or significant pulmonary nodules. Stable parenchymal bands in the peripheral anterior right lower lobe compatible with postinfectious/postinflammatory scarring. Upper abdomen: Lobulated 8.8 x 8.0 cm cystic lesion in the posterior right liver lobe (series 2/ image 130) with clustered eccentric coarse mural calcifications, which measured 8.5 x 7.6 cm on 07/20/2013 and 7.9 x 6.7 cm on 10/01/2015, mildly increased in size. Musculoskeletal: No aggressive appearing focal osseous lesions. Moderate thoracic spondylosis. IMPRESSION: 1. New right axillary adenopathy with surrounding fat stranding and overlying right axillary skin thickening, and with an irregular 4.3 x 2.4 cm subcutaneous right axillary fluid collection, which could represent an abscess (not well evaluated on this noncontrast scan). Surgical consultation should be considered. 2. No acute pulmonary disease. 3. Three-vessel coronary atherosclerosis. 4. 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 This mass was characterized as  multiloculated and benign appearing on 12/20/2009 MRI abdomen. Electronically Signed   By: Ilona Sorrel M.D.   On: 04/27/2017 15:41   US Renal  Result Date: 04/29/2017 CLINICAL DATA:  Acute renal failure, microscopic hematuria EXAM: RENAL / URINARY TRACT ULTRASOUND COMPLETE COMPARISON:  Renal ultrasound of October 08, 2015 FINDINGS: Right Kidney: Length: 13.5 cm. The renal cortical echotexture is mildly increased but remains lower than that of the adjacent liver. There is no hydronephrosis. There is no  focal mass. Left Kidney: Length: 12.0 cm. The renal cortical echotexture is mildly increased similar to that on the right. There is no hydronephrosis. Bladder: Appears normal for degree of bladder distention. Partially imaged is a right lower lobe hepatic cyst. This is been previously demonstrated. IMPRESSION: No hydronephrosis nor renal or bladder mass is observed. The renal cortical echotexture is mildly increased consistent with medical renal disease. This has been previously described. Electronically Signed   By: David  Martinique M.D.   On: 04/29/2017 08:47    Micro Results    No results found for this or any previous visit (from the past 240 hour(s)).     Today   Subjective:   Jonathon Snow today has no abdominal pain ,no nausea.feeling better at discharge.  Objective:   Blood pressure 139/78, pulse 70, temperature 98.1 F (36.7 C), temperature source Oral, resp. rate 16, height 6\' 2"  (1.88 m), weight 107.5 kg (236 lb 14.4 oz), SpO2 99 %.  No intake or output data in the 24 hours ending 05/14/17 1227  Exam Awake Alert, Oriented x 3, No new F.N deficits, Normal affect Jonathon Snow,PERRAL Supple Neck,No JVD, No cervical lymphadenopathy appriciated.  Symmetrical Chest wall movement, Good air movement bilaterally, CTAB RRR,No Gallops,Rubs or new Murmurs, No Parasternal Heave +ve B.Sounds, Abd Soft, Non tender, No organomegaly appriciated, No rebound -guarding or rigidity. No Cyanosis, Clubbing or edema, No new Rash or bruise  Data Review   CBC w Diff:  Lab Results  Component Value Date   WBC 6.8 05/09/2017   HGB 9.7 (L) 05/09/2017   HGB 10.4 (L) 04/10/2015   HCT 28.9 (L) 05/09/2017   HCT 31.4 (L) 04/10/2015   PLT 309 05/09/2017   PLT 179 04/10/2015   LYMPHOPCT 12 05/01/2017   LYMPHOPCT 13.8 03/28/2015   MONOPCT 6 05/01/2017   MONOPCT 12.9 03/28/2015   EOSPCT 1 05/01/2017   EOSPCT 2.1 03/28/2015   BASOPCT 0 05/01/2017   BASOPCT 0.2 03/28/2015    CMP:  Lab Results   Component Value Date   NA 138 05/12/2017   NA 141 04/17/2016   NA 138 04/10/2015   K 3.8 05/12/2017   K 3.5 04/10/2015   CL 106 05/12/2017   CL 108 04/10/2015   CO2 17 (L) 05/12/2017   CO2 25 04/10/2015   BUN 32 (H) 05/12/2017   BUN 23 04/17/2016   BUN 16 04/10/2015   CREATININE 2.50 (H) 05/12/2017   PROT 5.7 (L) 05/12/2017   PROT 6.9 04/17/2016   PROT 7.0 04/10/2015   ALBUMIN 2.9 (L) 05/12/2017   ALBUMIN 3.9 04/17/2016   ALBUMIN 3.9 04/10/2015   BILITOT 0.5 05/12/2017   BILITOT 0.4 04/17/2016   BILITOT 0.7 04/10/2015   ALKPHOS 92 05/12/2017   ALKPHOS 71 04/10/2015   AST 13 05/12/2017   AST 19 04/10/2015   ALT 14 05/12/2017   ALT 16 (L) 04/10/2015  .   Total Time in preparing paper work, data evaluation and todays exam - 31 minutes  Idolina Mantell M.D on 05/09/2017  at 12:27 PM    Note: This dictation was prepared with Dragon dictation along with smaller phrase technology. Any transcriptional errors that result from this process are unintentional.

## 2017-05-15 DIAGNOSIS — N492 Inflammatory disorders of scrotum: Secondary | ICD-10-CM | POA: Diagnosis not present

## 2017-05-15 DIAGNOSIS — B9562 Methicillin resistant Staphylococcus aureus infection as the cause of diseases classified elsewhere: Secondary | ICD-10-CM | POA: Diagnosis not present

## 2017-05-15 DIAGNOSIS — L02411 Cutaneous abscess of right axilla: Secondary | ICD-10-CM | POA: Diagnosis not present

## 2017-05-19 DIAGNOSIS — L02411 Cutaneous abscess of right axilla: Secondary | ICD-10-CM | POA: Diagnosis not present

## 2017-05-19 DIAGNOSIS — N492 Inflammatory disorders of scrotum: Secondary | ICD-10-CM | POA: Diagnosis not present

## 2017-05-19 DIAGNOSIS — B9562 Methicillin resistant Staphylococcus aureus infection as the cause of diseases classified elsewhere: Secondary | ICD-10-CM | POA: Diagnosis not present

## 2017-05-21 ENCOUNTER — Encounter: Payer: Self-pay | Admitting: Urology

## 2017-05-21 ENCOUNTER — Ambulatory Visit: Payer: 59 | Admitting: Urology

## 2017-05-21 VITALS — BP 176/101 | HR 99 | Ht 74.0 in | Wt 235.0 lb

## 2017-05-21 DIAGNOSIS — R3129 Other microscopic hematuria: Secondary | ICD-10-CM | POA: Diagnosis not present

## 2017-05-21 DIAGNOSIS — N492 Inflammatory disorders of scrotum: Secondary | ICD-10-CM

## 2017-05-21 LAB — URINALYSIS, COMPLETE
Bilirubin, UA: NEGATIVE
Glucose, UA: NEGATIVE
Ketones, UA: NEGATIVE
Nitrite, UA: NEGATIVE
PH UA: 5.5 (ref 5.0–7.5)
Specific Gravity, UA: 1.02 (ref 1.005–1.030)
Urobilinogen, Ur: 0.2 mg/dL (ref 0.2–1.0)

## 2017-05-21 LAB — MICROSCOPIC EXAMINATION
EPITHELIAL CELLS (NON RENAL): NONE SEEN /HPF (ref 0–10)
RBC, UA: >30 /hpf — ABNORMAL HIGH (ref 0–?)

## 2017-05-21 MED ORDER — LIDOCAINE HCL 2 % EX GEL
1.0000 "application " | Freq: Once | CUTANEOUS | Status: AC
  Administered 2017-05-21: 1 via URETHRAL

## 2017-05-21 MED ORDER — CIPROFLOXACIN HCL 500 MG PO TABS
500.0000 mg | ORAL_TABLET | Freq: Once | ORAL | Status: AC
  Administered 2017-05-21: 500 mg via ORAL

## 2017-05-21 NOTE — Progress Notes (Signed)
   05/21/17  CC:  Chief Complaint  Patient presents with  . Cysto    wound check    HPI: 56 year old male who returns today for further evaluation of microscopic hematuria noted on UA today as well as during previous hospital admission. He was admitted with multiple medical issues including a scrotal abscess which was I&D to the operating room in conjunction with an axillary abscess by general surgery.  He notes today that the abscess appears to be healing well. There is no further drainage, swelling, or induration. He is no longer able to pack the incision. No previous history of scrotal abscesses.  He was noted to have incidental microscopic hematuria on UA in the hospital. He does have a history of PE and is on chronic Eliquis. He denies any history of gross hematuria. He is a never smoker. He does have a remote history of kidney stones. Most recent upper tract imaging several years ago in the form of renal ultrasound.  He does have chronic kidney disease and is unable to tolerate IV contrast.   Blood pressure (!) 176/101, pulse 99, height 6\' 2"  (1.88 m), weight 235 lb (106.6 kg). NED. A&Ox3.   No respiratory distress   Abd soft, NT, ND Normal phallus with bilateral descended testicles.  Left lateral lower hemiscrotal incision healing well, no induration, drainage, or redness.  Cystoscopy Procedure Note  Patient identification was confirmed, informed consent was obtained, and patient was prepped using Betadine solution.  Lidocaine jelly was administered per urethral meatus.    Preoperative abx where received prior to procedure.     Pre-Procedure: - Inspection reveals a normal caliber ureteral meatus.  Procedure: The flexible cystoscope was introduced without difficulty - No urethral strictures/lesions are present. - Enlarged prostate with prominent prostatic utricle - Normal bladder neck - Bilateral ureteral orifices identified - Bladder mucosa  reveals no ulcers, tumors,  or lesions - No bladder stones - No trabeculation  Retroflexion unremarkable  Post-Procedure: - Patient tolerated the procedure well  Assessment/ Plan:    1. Microscopic hematuria Status post unremarkable cystoscopy today for further workup Recommend upper tract imaging, unable to tolerate CT urogram given inability to have IV contrast Discussed options including proceeding to the operating room for bilateral retrogrades to evaluate upper tracts versus KUB/renal ultrasound Risk of possible misdiagnosis with the aforementioned imaging was discussed, given that he is a nonsmoker, his risk is fairly low for upper tract urothelial cancer. He would prefer to avoid the operating room and is agreeable with renal ultrasound/KUB understanding that there is a possibility of missed diagnoses.  We'll call with renal ultrasound/KUB results, arrange for appropriate follow up with this any pathology identified.  - Urinalysis, Complete - ciprofloxacin (CIPRO) tablet 500 mg; Take 1 tablet (500 mg total) by mouth once. - lidocaine (XYLOCAINE) 2 % jelly 1 application; Place 1 application into the urethra once. - US Renal; Future - DG Abd 1 View; Future  2. Scrotal abscess Healing well, no further intervention   Return for will call with RUS/ KUB results.  Hollice Espy, MD

## 2017-05-22 ENCOUNTER — Emergency Department
Admission: EM | Admit: 2017-05-22 | Discharge: 2017-05-22 | Disposition: A | Discharge status: Home / Self Care | Payer: 59 | Attending: Emergency Medicine | Admitting: Emergency Medicine

## 2017-05-22 ENCOUNTER — Encounter: Payer: Self-pay | Admitting: Emergency Medicine

## 2017-05-22 ENCOUNTER — Telehealth: Payer: Self-pay | Admitting: Family Medicine

## 2017-05-22 DIAGNOSIS — L02411 Cutaneous abscess of right axilla: Secondary | ICD-10-CM | POA: Diagnosis not present

## 2017-05-22 DIAGNOSIS — J449 Chronic obstructive pulmonary disease, unspecified: Secondary | ICD-10-CM | POA: Insufficient documentation

## 2017-05-22 DIAGNOSIS — I10 Essential (primary) hypertension: Secondary | ICD-10-CM | POA: Diagnosis not present

## 2017-05-22 DIAGNOSIS — N492 Inflammatory disorders of scrotum: Secondary | ICD-10-CM | POA: Diagnosis not present

## 2017-05-22 DIAGNOSIS — B9562 Methicillin resistant Staphylococcus aureus infection as the cause of diseases classified elsewhere: Secondary | ICD-10-CM | POA: Diagnosis not present

## 2017-05-22 LAB — BASIC METABOLIC PANEL
ANION GAP: 9 (ref 5–15)
BUN: 21 mg/dL — ABNORMAL HIGH (ref 6–20)
CHLORIDE: 106 mmol/L (ref 101–111)
CO2: 23 mmol/L (ref 22–32)
CREATININE: 1.65 mg/dL — AB (ref 0.61–1.24)
Calcium: 8.6 mg/dL — ABNORMAL LOW (ref 8.9–10.3)
GFR calc non Af Amer: 45 mL/min — ABNORMAL LOW (ref >60)
GFR, EST AFRICAN AMERICAN: 52 mL/min — AB (ref >60)
Glucose, Bld: 142 mg/dL — ABNORMAL HIGH (ref 65–99)
Potassium: 4.2 mmol/L (ref 3.5–5.1)
SODIUM: 138 mmol/L (ref 135–145)

## 2017-05-22 LAB — CBC WITH DIFFERENTIAL/PLATELET
BASOS ABS: 0.1 10*3/uL (ref 0–0.1)
BASOS PCT: 1 %
EOS ABS: 0 10*3/uL (ref 0–0.7)
Eosinophils Relative: 0 %
HEMATOCRIT: 30.9 % — AB (ref 40.0–52.0)
HEMOGLOBIN: 10.4 g/dL — AB (ref 13.0–18.0)
Lymphocytes Relative: 9 %
Lymphs Abs: 0.7 10*3/uL — ABNORMAL LOW (ref 1.0–3.6)
MCH: 30.3 pg (ref 26.0–34.0)
MCHC: 33.6 g/dL (ref 32.0–36.0)
MCV: 89.9 fL (ref 80.0–100.0)
MONOS PCT: 8 %
Monocytes Absolute: 0.6 10*3/uL (ref 0.2–1.0)
NEUTROS ABS: 6.1 10*3/uL (ref 1.4–6.5)
NEUTROS PCT: 82 %
Platelets: 258 10*3/uL (ref 150–440)
RBC: 3.44 MIL/uL — AB (ref 4.40–5.90)
RDW: 14.2 % (ref 11.5–14.5)
WBC: 7.4 10*3/uL (ref 3.8–10.6)

## 2017-05-22 NOTE — ED Notes (Signed)
Discussed pt with dr kinner 
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
## 2017-05-22 NOTE — ED Notes (Signed)
ED Provider at bedside. 

## 2017-05-22 NOTE — Telephone Encounter (Signed)
Jonathon Snow with [name] states she was doing patient assessment and his original blood pressure reading 200/110. She checked it through out visit and it remained the same except one time it read 190/110. His pulse resting at 104. Please return her call at [phone]

## 2017-05-22 NOTE — Telephone Encounter (Signed)
Jonathon Snow with [name] states she was doing patient assessment and his original blood pressure reading 200/110. She checked it through out visit and it remained the same except one time it read 190/110. His pulse resting at 104. Please return her call at [phone]

## 2017-05-22 NOTE — ED Triage Notes (Signed)
Pt has home nurse come out one X per week to check vitals. Today report systolic over 338 and diastolic 250. Pt denies any symptoms of feeling any different than baseline. No complaints from pt at this time. Ambulatory to triage.

## 2017-05-22 NOTE — Telephone Encounter (Signed)
Spoke with Marlowe Kays and she reported that patient's blood pressure was 200/110 and 190/110 average when she checked it 3-4 times. Patient was not feeling well in general. I asked her to contact patient and inform him that he should go to the ER right away for management. She verbalized agreement

## 2017-05-27 DIAGNOSIS — N492 Inflammatory disorders of scrotum: Secondary | ICD-10-CM | POA: Diagnosis not present

## 2017-05-27 DIAGNOSIS — B9562 Methicillin resistant Staphylococcus aureus infection as the cause of diseases classified elsewhere: Secondary | ICD-10-CM | POA: Diagnosis not present

## 2017-05-27 DIAGNOSIS — L02411 Cutaneous abscess of right axilla: Secondary | ICD-10-CM | POA: Diagnosis not present

## 2017-05-29 ENCOUNTER — Ambulatory Visit: Payer: 59

## 2017-06-01 DIAGNOSIS — I10 Essential (primary) hypertension: Secondary | ICD-10-CM | POA: Diagnosis not present

## 2017-06-01 DIAGNOSIS — R809 Proteinuria, unspecified: Secondary | ICD-10-CM | POA: Diagnosis not present

## 2017-06-01 DIAGNOSIS — M3214 Glomerular disease in systemic lupus erythematosus: Secondary | ICD-10-CM | POA: Diagnosis not present

## 2017-06-01 DIAGNOSIS — D631 Anemia in chronic kidney disease: Secondary | ICD-10-CM | POA: Diagnosis not present

## 2017-06-08 ENCOUNTER — Other Ambulatory Visit: Payer: Self-pay | Admitting: Family Medicine

## 2017-06-08 DIAGNOSIS — K2900 Acute gastritis without bleeding: Secondary | ICD-10-CM

## 2017-06-15 DIAGNOSIS — L02411 Cutaneous abscess of right axilla: Secondary | ICD-10-CM | POA: Diagnosis not present

## 2017-06-30 DIAGNOSIS — N183 Chronic kidney disease, stage 3 (moderate): Secondary | ICD-10-CM | POA: Diagnosis not present

## 2017-06-30 DIAGNOSIS — M3214 Glomerular disease in systemic lupus erythematosus: Secondary | ICD-10-CM | POA: Diagnosis not present

## 2017-06-30 DIAGNOSIS — I10 Essential (primary) hypertension: Secondary | ICD-10-CM | POA: Diagnosis not present

## 2017-07-09 DIAGNOSIS — L03111 Cellulitis of right axilla: Secondary | ICD-10-CM | POA: Diagnosis not present

## 2017-08-06 ENCOUNTER — Ambulatory Visit (INDEPENDENT_AMBULATORY_CARE_PROVIDER_SITE_OTHER): Payer: 59 | Admitting: Family Medicine

## 2017-08-06 ENCOUNTER — Encounter: Payer: Self-pay | Admitting: Family Medicine

## 2017-08-06 VITALS — BP 141/76 | HR 87 | Temp 97.9°F | Resp 16 | Ht 74.0 in | Wt 243.7 lb

## 2017-08-06 DIAGNOSIS — L509 Urticaria, unspecified: Secondary | ICD-10-CM

## 2017-08-06 MED ORDER — DIPHENHYDRAMINE HCL 25 MG PO CAPS: 25.0000 mg | ORAL_CAPSULE | Freq: Three times a day (TID) | ORAL | 0 refills | Status: DC | PRN

## 2017-08-06 NOTE — Progress Notes (Signed)
Name: Jonathon Snow   MRN: [id number]    DOB: Jun 30, 1961   Date:08/06/2017       Progress Note  Subjective  Chief Complaint  Chief Complaint  Patient presents with  . Rash    on back x1 week  . Follow-up    Hep C    Rash  This is a new problem. The current episode started in the past 7 days. The affected locations include the back. The rash is characterized by itchiness, pain and redness. He was exposed to nothing. Associated symptoms include fatigue. Pertinent negatives include no cough, fever or joint pain. Past treatments include topical steroids. The treatment provided mild relief.     Past Medical History:  Diagnosis Date  . DVT (deep venous thrombosis) ([employer])   . Hyperlipidemia   . Hypertension   . Lupus   . Pulmonary embolism ([place])   . Renal disorder     Past Surgical History:  Procedure Laterality Date  . ANKLE SURGERY Right   . CYST EXCISION  92 or 93    Liver cyst removal [facility]  . I&D EXTREMITY Right 04/29/2017   Procedure: IRRIGATION AND DEBRIDEMENT EXTREMITY;  Surgeon: Clayburn Pert, MD;  Location: [facility] ORS;  Service: General;  Laterality: Right;  . IRRIGATION AND DEBRIDEMENT ABSCESS Left 04/29/2017   Procedure: IRRIGATION AND DEBRIDEMENT Scrotal ABSCESS;  Surgeon: Clayburn Pert, MD;  Location: [facility] ORS;  Service: General;  Laterality: Left;    Family History  Problem Relation Age of Onset  . Hypertension Father     Social History   Social History  . Marital status: Divorced    Spouse name: N/A  . Number of children: N/A  . Years of education: N/A   Occupational History  . Not on file.   Social History Main Topics  . Smoking status: Never Smoker  . Smokeless tobacco: Never Used  . Alcohol use No  . Drug use: No  . Sexual activity: Not on file   Other Topics Concern  . Not on file   Social History Narrative  . No narrative on file     Current Outpatient Prescriptions:  .  COMBIVENT RESPIMAT 20-100 MCG/ACT AERS respimat, Inhale 1 puff  into the lungs daily as needed., Disp: , Rfl:  .  ELIQUIS 5 MG TABS tablet, TAKE 1 TABLET BY MOUTH 2 TIMES DAILY, Disp: 60 tablet, Rfl: 5 .  losartan (COZAAR) 100 MG tablet, Take 100 mg by mouth daily., Disp: , Rfl: 6 .  Multiple Vitamins-Minerals (MULTIVITAMIN WITH MINERALS) tablet, Take 1 tablet by mouth daily., Disp: , Rfl:  .  mupirocin ointment (BACTROBAN) 2 %, Place 1 application into the nose 2 (two) times daily., Disp: 22 g, Rfl: 0 .  mycophenolate (CELLCEPT) 500 MG tablet, Take 1,000 mg by mouth 2 (two) times daily., Disp: , Rfl:  .  pantoprazole (PROTONIX) 40 MG tablet, TAKE 1 TABLET BY MOUTH EVERY DAY, Disp: 30 tablet, Rfl: 0 .  prednisoLONE 5 MG TABS tablet, Take 10 mg by mouth daily., Disp: , Rfl:  .  promethazine (PHENERGAN) 50 MG tablet, Take 0.5 tablets (25 mg total) by mouth every 6 (six) hours as needed for nausea or vomiting., Disp: 30 tablet, Rfl: 0 .  rosuvastatin (CRESTOR) 10 MG tablet, Take 1 tablet (10 mg total) by mouth at bedtime., Disp: 90 tablet, Rfl: 0 .  traMADol (ULTRAM) 50 MG tablet, Take 2 tablets (100 mg total) by mouth every 6 (six) hours as needed for moderate pain., Disp:  30 tablet, Rfl: 0  Allergies  Allergen Reactions  . Vicodin [Hydrocodone-Acetaminophen] Hives and Other (See Comments)    Rash/hives Other Reaction: severe headaches Rash/hives Other Reaction: severe headaches Rash/hives  . Hydrocodone Rash  . Oxycodone Rash    Takes occasionally.     Review of Systems  Constitutional: Positive for fatigue. Negative for fever.  Respiratory: Negative for cough.   Musculoskeletal: Negative for joint pain.  Skin: Positive for rash.      Objective  Vitals:   08/06/17 1038  BP: (!) 141/76  Pulse: 87  Resp: 16  Temp: 97.9 F (36.6 C)  TempSrc: Oral  SpO2: 96%  Weight: 243 lb 11.2 oz (110.5 kg)  Height: 6\' 2"  (1.88 m)    Physical Exam  Constitutional: He is oriented to person, place, and time and well-developed, well-nourished, and in  no distress.  Musculoskeletal:       Back:  Neurological: He is alert and oriented to person, place, and time.  Skin: Rash noted. Rash is urticarial.     Psychiatric: Mood, memory, affect and judgment normal.    Assessment & Plan  1. Urticarial rash Likely due to histamine release, started on diphenhydramine every 8 hours as needed - diphenhydrAMINE (BENADRYL) 25 mg capsule; Take 1 capsule (25 mg total) by mouth every 8 (eight) hours as needed.  Dispense: 10 capsule; Refill: 0   Lamona Eimer [name] 08/06/2017 11:22 AM

## 2017-08-13 ENCOUNTER — Other Ambulatory Visit: Payer: Self-pay | Admitting: Family Medicine

## 2017-08-20 DIAGNOSIS — Z23 Encounter for immunization: Secondary | ICD-10-CM | POA: Diagnosis not present

## 2017-08-25 ENCOUNTER — Telehealth: Payer: Self-pay

## 2017-08-25 NOTE — Telephone Encounter (Signed)
If the rash is not relieved with initial treatment, he should schedule an appointment for a reevaluation and possible change in medication

## 2017-08-25 NOTE — Telephone Encounter (Signed)
Patient called on 08/24/17 @ 1:04pm and spoke with [facility] Nurse.  The message stated that this patient was prescribed an antibiotic for a rash. He said that it looked like it was getting better, but when he woke up on yesterday it was worse. The rash is only on his back but he stated that it is itching all over.  Please advise.

## 2017-08-27 DIAGNOSIS — B078 Other viral warts: Secondary | ICD-10-CM | POA: Diagnosis not present

## 2017-08-27 DIAGNOSIS — L905 Scar conditions and fibrosis of skin: Secondary | ICD-10-CM | POA: Diagnosis not present

## 2017-08-27 DIAGNOSIS — L240 Irritant contact dermatitis due to detergents: Secondary | ICD-10-CM | POA: Diagnosis not present

## 2017-08-28 ENCOUNTER — Ambulatory Visit: Payer: 59 | Admitting: Family Medicine

## 2017-09-25 DIAGNOSIS — B372 Candidiasis of skin and nail: Secondary | ICD-10-CM | POA: Diagnosis not present

## 2017-09-25 DIAGNOSIS — L720 Epidermal cyst: Secondary | ICD-10-CM | POA: Diagnosis not present

## 2017-09-25 DIAGNOSIS — R21 Rash and other nonspecific skin eruption: Secondary | ICD-10-CM | POA: Diagnosis not present

## 2017-10-01 ENCOUNTER — Ambulatory Visit: Payer: 59 | Admitting: Family Medicine

## 2017-10-18 ENCOUNTER — Emergency Department (HOSPITAL_COMMUNITY): Payer: 59

## 2017-10-18 ENCOUNTER — Emergency Department (HOSPITAL_COMMUNITY)
Admission: EM | Admit: 2017-10-18 | Discharge: 2017-10-18 | Disposition: A | Discharge status: Home / Self Care | Payer: 59 | Attending: Emergency Medicine | Admitting: Emergency Medicine

## 2017-10-18 ENCOUNTER — Encounter (HOSPITAL_COMMUNITY): Payer: Self-pay | Admitting: *Deleted

## 2017-10-18 DIAGNOSIS — R319 Hematuria, unspecified: Secondary | ICD-10-CM | POA: Diagnosis not present

## 2017-10-18 DIAGNOSIS — Z79899 Other long term (current) drug therapy: Secondary | ICD-10-CM | POA: Insufficient documentation

## 2017-10-18 DIAGNOSIS — R9431 Abnormal electrocardiogram [ECG] [EKG]: Secondary | ICD-10-CM | POA: Insufficient documentation

## 2017-10-18 DIAGNOSIS — Z86711 Personal history of pulmonary embolism: Secondary | ICD-10-CM | POA: Diagnosis not present

## 2017-10-18 DIAGNOSIS — Z7901 Long term (current) use of anticoagulants: Secondary | ICD-10-CM | POA: Diagnosis not present

## 2017-10-18 DIAGNOSIS — R55 Syncope and collapse: Secondary | ICD-10-CM | POA: Insufficient documentation

## 2017-10-18 DIAGNOSIS — N189 Chronic kidney disease, unspecified: Secondary | ICD-10-CM | POA: Insufficient documentation

## 2017-10-18 DIAGNOSIS — J449 Chronic obstructive pulmonary disease, unspecified: Secondary | ICD-10-CM | POA: Insufficient documentation

## 2017-10-18 DIAGNOSIS — R0600 Dyspnea, unspecified: Secondary | ICD-10-CM | POA: Insufficient documentation

## 2017-10-18 DIAGNOSIS — R0602 Shortness of breath: Secondary | ICD-10-CM | POA: Insufficient documentation

## 2017-10-18 DIAGNOSIS — M3210 Systemic lupus erythematosus, organ or system involvement unspecified: Secondary | ICD-10-CM | POA: Insufficient documentation

## 2017-10-18 DIAGNOSIS — R311 Benign essential microscopic hematuria: Secondary | ICD-10-CM | POA: Diagnosis not present

## 2017-10-18 DIAGNOSIS — I129 Hypertensive chronic kidney disease with stage 1 through stage 4 chronic kidney disease, or unspecified chronic kidney disease: Secondary | ICD-10-CM | POA: Insufficient documentation

## 2017-10-18 DIAGNOSIS — R079 Chest pain, unspecified: Secondary | ICD-10-CM | POA: Diagnosis present

## 2017-10-18 DIAGNOSIS — K5730 Diverticulosis of large intestine without perforation or abscess without bleeding: Secondary | ICD-10-CM | POA: Diagnosis not present

## 2017-10-18 LAB — CBC WITH DIFFERENTIAL/PLATELET
Basophils Absolute: 0 10*3/uL (ref 0.0–0.1)
Basophils Relative: 0 %
EOS ABS: 0 10*3/uL (ref 0.0–0.7)
EOS PCT: 0 %
HCT: 40.1 % (ref 39.0–52.0)
Hemoglobin: 12.9 g/dL — ABNORMAL LOW (ref 13.0–17.0)
LYMPHS ABS: 0.6 10*3/uL — AB (ref 0.7–4.0)
Lymphocytes Relative: 10 %
MCH: 28.6 pg (ref 26.0–34.0)
MCHC: 32.2 g/dL (ref 30.0–36.0)
MCV: 88.9 fL (ref 78.0–100.0)
MONO ABS: 0.7 10*3/uL (ref 0.1–1.0)
MONOS PCT: 12 %
Neutro Abs: 4.7 10*3/uL (ref 1.7–7.7)
Neutrophils Relative %: 78 %
PLATELETS: 171 10*3/uL (ref 150–400)
RBC: 4.51 MIL/uL (ref 4.22–5.81)
RDW: 14.3 % (ref 11.5–15.5)
WBC: 6 10*3/uL (ref 4.0–10.5)

## 2017-10-18 LAB — TROPONIN I
Troponin I: <0.03 ng/mL (ref <0.03)
Troponin I: <0.03 ng/mL (ref <0.03)

## 2017-10-18 LAB — LIPID PANEL
CHOLESTEROL: 236 mg/dL — AB (ref 0–200)
HDL: 41 mg/dL (ref >40)
LDL CALC: 170 mg/dL — AB (ref 0–99)
TRIGLYCERIDES: 126 mg/dL (ref <150)
Total CHOL/HDL Ratio: 5.8 RATIO
VLDL: 25 mg/dL (ref 0–40)

## 2017-10-18 LAB — COMPREHENSIVE METABOLIC PANEL
ALK PHOS: 73 U/L (ref 38–126)
ALT: 18 U/L (ref 17–63)
ANION GAP: 9 (ref 5–15)
AST: 20 U/L (ref 15–41)
Albumin: 3.4 g/dL — ABNORMAL LOW (ref 3.5–5.0)
BILIRUBIN TOTAL: 0.9 mg/dL (ref 0.3–1.2)
BUN: 29 mg/dL — ABNORMAL HIGH (ref 6–20)
CALCIUM: 8.6 mg/dL — AB (ref 8.9–10.3)
CO2: 20 mmol/L — ABNORMAL LOW (ref 22–32)
CREATININE: 1.77 mg/dL — AB (ref 0.61–1.24)
Chloride: 110 mmol/L (ref 101–111)
GFR calc non Af Amer: 42 mL/min — ABNORMAL LOW (ref >60)
GFR, EST AFRICAN AMERICAN: 48 mL/min — AB (ref >60)
Glucose, Bld: 100 mg/dL — ABNORMAL HIGH (ref 65–99)
Potassium: 4.4 mmol/L (ref 3.5–5.1)
Sodium: 139 mmol/L (ref 135–145)
TOTAL PROTEIN: 5.8 g/dL — AB (ref 6.5–8.1)

## 2017-10-18 LAB — URINALYSIS, ROUTINE W REFLEX MICROSCOPIC
Bacteria, UA: NONE SEEN
Bilirubin Urine: NEGATIVE
GLUCOSE, UA: NEGATIVE mg/dL
KETONES UR: NEGATIVE mg/dL
LEUKOCYTES UA: NEGATIVE
Nitrite: NEGATIVE
PH: 5 (ref 5.0–8.0)
Protein, ur: 30 mg/dL — AB
SPECIFIC GRAVITY, URINE: 1.013 (ref 1.005–1.030)
SQUAMOUS EPITHELIAL / LPF: NONE SEEN

## 2017-10-18 LAB — CBG MONITORING, ED: GLUCOSE-CAPILLARY: 94 mg/dL (ref 65–99)

## 2017-10-18 LAB — D-DIMER, QUANTITATIVE: D-Dimer, Quant: 0.5 ug/mL-FEU (ref 0.00–0.50)

## 2017-10-18 LAB — PROTIME-INR
INR: 1.14
PROTHROMBIN TIME: 14.5 s (ref 11.4–15.2)

## 2017-10-18 LAB — APTT: aPTT: 27 seconds (ref 24–36)

## 2017-10-18 LAB — BRAIN NATRIURETIC PEPTIDE: B NATRIURETIC PEPTIDE 5: 19.7 pg/mL (ref 0.0–100.0)

## 2017-10-18 MED ORDER — HEPARIN SODIUM (PORCINE) 5000 UNIT/ML IJ SOLN: 60.0000 [IU]/kg | Freq: Once | INTRAMUSCULAR | Status: DC

## 2017-10-18 MED ORDER — ASPIRIN 81 MG PO CHEW: 324.0000 mg | CHEWABLE_TABLET | Freq: Once | ORAL | Status: AC

## 2017-10-18 MED ORDER — HEPARIN SODIUM (PORCINE) 5000 UNIT/ML IJ SOLN: 4000.0000 [IU] | Freq: Once | INTRAMUSCULAR | Status: DC

## 2017-10-18 MED ORDER — HEPARIN SODIUM (PORCINE) 5000 UNIT/ML IJ SOLN: 5000.0000 [IU] | Freq: Once | INTRAMUSCULAR | Status: DC

## 2017-10-18 MED ORDER — ONDANSETRON HCL 4 MG/2ML IJ SOLN
4.0000 mg | Freq: Once | INTRAMUSCULAR | Status: AC
  Administered 2017-10-18: 4 mg via INTRAVENOUS
  Filled 2017-10-18: qty 2

## 2017-10-18 MED ORDER — ONDANSETRON HCL 4 MG/2ML IJ SOLN
4.0000 mg | Freq: Once | INTRAMUSCULAR | Status: DC
  Filled 2017-10-18: qty 2

## 2017-10-18 MED ORDER — SODIUM CHLORIDE 0.9 % IV BOLUS (SEPSIS)
500.0000 mL | Freq: Once | INTRAVENOUS | Status: AC
  Administered 2017-10-18: 500 mL via INTRAVENOUS

## 2017-10-18 NOTE — ED Notes (Signed)
Pt given urinal.

## 2017-10-18 NOTE — ED Notes (Signed)
Patient is nauseated again.  MD notified.  Patient medicated per MD orders.

## 2017-10-18 NOTE — ED Notes (Signed)
Patient complaining of some nausea at this time.  MD notified.  Patient medicated per order.
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
## 2017-10-18 NOTE — ED Triage Notes (Signed)
Pt here via [facility] for possible STEMI.  Pt was experiencing sob and nausea EMS was called when pt had a syncopal episode.  Pt does not remember falling, but EMS states no signs of injury to head or neck.

## 2017-10-18 NOTE — ED Notes (Signed)
Per Dr Reather Converse, stemi has been cancelled by cards.

## 2017-10-18 NOTE — Discharge Instructions (Signed)
If you were given medicines take as directed.  If you are on coumadin or contraceptives realize their levels and effectiveness is altered by many different medicines.  If you have any reaction (rash, tongues swelling, other) to the medicines stop taking and see a physician.    If your blood pressure was elevated in the ER make sure you follow up for management with a primary doctor or return for chest pain, shortness of breath or stroke symptoms.  Please follow up as directed and return to the ER or see a physician for new or worsening symptoms.  Thank you. Vitals:   10/18/17 2130 10/18/17 2200 10/18/17 2215 10/18/17 2245  BP: (!) 168/90 (!) 152/98 (!) 169/92 (!) 172/101  Pulse: (!) 101 75 91 78  Resp: (!) 21 10 10 15   SpO2: 98% 98% 99% 99%  Weight:      Height:

## 2017-10-18 NOTE — ED Notes (Signed)
Patient taken to CT.

## 2017-10-29 DIAGNOSIS — R809 Proteinuria, unspecified: Secondary | ICD-10-CM | POA: Diagnosis not present

## 2017-10-29 DIAGNOSIS — I10 Essential (primary) hypertension: Secondary | ICD-10-CM | POA: Diagnosis not present

## 2017-10-29 DIAGNOSIS — M3214 Glomerular disease in systemic lupus erythematosus: Secondary | ICD-10-CM | POA: Diagnosis not present

## 2017-11-06 ENCOUNTER — Emergency Department
Admission: EM | Admit: 2017-11-06 | Discharge: 2017-11-06 | Disposition: A | Discharge status: Home / Self Care | Payer: 59 | Attending: Emergency Medicine | Admitting: Emergency Medicine

## 2017-11-06 ENCOUNTER — Emergency Department: Payer: 59

## 2017-11-06 ENCOUNTER — Encounter: Payer: Self-pay | Admitting: Emergency Medicine

## 2017-11-06 ENCOUNTER — Other Ambulatory Visit: Payer: Self-pay

## 2017-11-06 DIAGNOSIS — R197 Diarrhea, unspecified: Secondary | ICD-10-CM

## 2017-11-06 DIAGNOSIS — J449 Chronic obstructive pulmonary disease, unspecified: Secondary | ICD-10-CM | POA: Diagnosis not present

## 2017-11-06 DIAGNOSIS — N183 Chronic kidney disease, stage 3 (moderate): Secondary | ICD-10-CM | POA: Diagnosis not present

## 2017-11-06 DIAGNOSIS — R531 Weakness: Secondary | ICD-10-CM | POA: Insufficient documentation

## 2017-11-06 DIAGNOSIS — R55 Syncope and collapse: Secondary | ICD-10-CM

## 2017-11-06 DIAGNOSIS — I82502 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 of left lower extremity: Secondary | ICD-10-CM | POA: Diagnosis not present

## 2017-11-06 DIAGNOSIS — R42 Dizziness and giddiness: Secondary | ICD-10-CM | POA: Diagnosis not present

## 2017-11-06 DIAGNOSIS — R1084 Generalized abdominal pain: Secondary | ICD-10-CM

## 2017-11-06 DIAGNOSIS — I129 Hypertensive chronic kidney disease with stage 1 through stage 4 chronic kidney disease, or unspecified chronic kidney disease: Secondary | ICD-10-CM | POA: Insufficient documentation

## 2017-11-06 DIAGNOSIS — Z7901 Long term (current) use of anticoagulants: Secondary | ICD-10-CM | POA: Diagnosis not present

## 2017-11-06 DIAGNOSIS — Z79899 Other long term (current) drug therapy: Secondary | ICD-10-CM | POA: Insufficient documentation

## 2017-11-06 DIAGNOSIS — R1111 Vomiting without nausea: Secondary | ICD-10-CM | POA: Diagnosis not present

## 2017-11-06 DIAGNOSIS — R112 Nausea with vomiting, unspecified: Secondary | ICD-10-CM | POA: Diagnosis not present

## 2017-11-06 LAB — COMPREHENSIVE METABOLIC PANEL
ALK PHOS: 70 U/L (ref 38–126)
ALT: 17 U/L (ref 17–63)
ANION GAP: 8 (ref 5–15)
AST: 26 U/L (ref 15–41)
Albumin: 3.5 g/dL (ref 3.5–5.0)
BILIRUBIN TOTAL: 0.8 mg/dL (ref 0.3–1.2)
BUN: 27 mg/dL — ABNORMAL HIGH (ref 6–20)
CALCIUM: 8.7 mg/dL — AB (ref 8.9–10.3)
CO2: 22 mmol/L (ref 22–32)
CREATININE: 1.57 mg/dL — AB (ref 0.61–1.24)
Chloride: 109 mmol/L (ref 101–111)
GFR calc non Af Amer: 48 mL/min — ABNORMAL LOW (ref >60)
GFR, EST AFRICAN AMERICAN: 56 mL/min — AB (ref >60)
Glucose, Bld: 110 mg/dL — ABNORMAL HIGH (ref 65–99)
Potassium: 4.1 mmol/L (ref 3.5–5.1)
SODIUM: 139 mmol/L (ref 135–145)
TOTAL PROTEIN: 6.5 g/dL (ref 6.5–8.1)

## 2017-11-06 LAB — CBC
HCT: 40.1 % (ref 40.0–52.0)
Hemoglobin: 12.8 g/dL — ABNORMAL LOW (ref 13.0–18.0)
MCH: 28.2 pg (ref 26.0–34.0)
MCHC: 31.8 g/dL — AB (ref 32.0–36.0)
MCV: 88.7 fL (ref 80.0–100.0)
Platelets: 167 10*3/uL (ref 150–440)
RBC: 4.52 MIL/uL (ref 4.40–5.90)
RDW: 15.4 % — ABNORMAL HIGH (ref 11.5–14.5)
WBC: 4.6 10*3/uL (ref 3.8–10.6)

## 2017-11-06 LAB — TROPONIN I

## 2017-11-06 LAB — LIPASE, BLOOD: Lipase: 46 U/L (ref 11–51)

## 2017-11-06 MED ORDER — SODIUM CHLORIDE 0.9 % IV BOLUS (SEPSIS)
1000.0000 mL | Freq: Once | INTRAVENOUS | Status: AC
  Administered 2017-11-06: 1000 mL via INTRAVENOUS

## 2017-11-06 MED ORDER — ONDANSETRON 4 MG PO TBDP: 4.0000 mg | ORAL_TABLET | Freq: Three times a day (TID) | ORAL | 0 refills | Status: DC | PRN

## 2017-11-06 MED ORDER — ONDANSETRON HCL 4 MG/2ML IJ SOLN
4.0000 mg | Freq: Once | INTRAMUSCULAR | Status: AC
  Administered 2017-11-06: 4 mg via INTRAVENOUS
  Filled 2017-11-06: qty 2

## 2017-11-06 MED ORDER — LOPERAMIDE HCL 2 MG PO TABS: 2.0000 mg | ORAL_TABLET | Freq: Four times a day (QID) | ORAL | 0 refills | Status: DC | PRN

## 2017-11-06 MED ORDER — MORPHINE SULFATE (PF) 2 MG/ML IV SOLN
2.0000 mg | Freq: Once | INTRAVENOUS | Status: AC
  Administered 2017-11-06: 2 mg via INTRAVENOUS
  Filled 2017-11-06: qty 1

## 2017-11-06 MED ORDER — IOPAMIDOL (ISOVUE-300) INJECTION 61%: 30.0000 mL | Freq: Once | INTRAVENOUS | Status: DC | PRN

## 2017-11-06 NOTE — ED Notes (Signed)
Pt ambulatory to POV. NAD, VSS. Skin PWD. Resp equal and non-labored. No questions or concerns voiced at time of discharge.

## 2017-11-06 NOTE — ED Notes (Signed)
Pt sleeping in room at this time.Call light within reach. CT contrast at bedside.

## 2017-11-06 NOTE — ED Provider Notes (Signed)
-----------------------------------------   7:35 AM on 11/06/2017 -----------------------------------------   Blood pressure (!) 162/100, pulse 79, temperature 98 F (36.7 C), resp. rate 18, height 6\' 3"  (1.905 m), weight 108.9 kg (240 lb), SpO2 99 %.  Assuming care from Dr. Owens Shark of [facility] is a 56 y.o. male with a chief complaint of Abdominal Pain .    Please refer to H&P by previous MD for further details.  The current plan of care is to f/u results of MRA/MRV and CT a/p and admission for syncope.     _________________________ 9:58 AM on 11/06/2017 -----------------------------------------  Imaging studies with no acute findings. Will discuss with hospitalist for admission for syncope.   _________________________ 10:03 AM on 11/06/2017 -----------------------------------------  Patient refused admission and wishes to be discharged home. Patient tells me that he has been having abdominal cramping, vomiting and diarrhea on and off for 3 weeks. Has never had anything like this before. Today has had no diarrhea or vomiting and has no abdominal pain. Has had 2 syncopal episodes while walking but has not sustained any injuries from those. Has had no melena or coffee-ground emesis or hematemesis. Patient has had a colonoscopy several years ago with Dr. Donnella Sham. With negative workup here, patient asymptomatic at this time and stable labs and vitals I don't think it is unreasonable to continue to work up as outpatient even though I highly encouraged patient to stay for further evaluation. Unlikely that his diarrhea is infectious due to length of symptoms and the fact that WBC is normal and patient has no fever. Therefore I will start patient on imodium and zofran and refer him back to Dr. Donnella Sham for repeat endoscopy and colonoscopy. Discussed return precautions with patient and recommended that he return at any time if he wishes to continue his evaluation.  ED ECG REPORT I, Rudene Re, the attending physician, personally viewed and interpreted this ECG.  Normal sinus rhythm, normal intervals, normal axis, no STE or depressions, no evidence of HOCM, AV block, delta wave, ARVD, prolonged QTc, WPW, or Brugada.     Rudene Re, MD 11/06/17 [phone]

## 2017-11-06 NOTE — ED Triage Notes (Signed)
Patient ambulatory to triage with steady gait, without difficulty or distress noted; pt reports N/V/D, abd cramping, right lower abd tenderness last few weeks; seen at [facility] recently for same and told to f/u with PCP and was to setup for specialist but has not been given an appt yet

## 2017-11-06 NOTE — ED Notes (Signed)
Unsuccessful IV start x2 (1x RAC, 1x RFA), notifying patient's primary nurse.

## 2017-11-06 NOTE — ED Notes (Signed)
Pt to CT at this time.

## 2017-11-06 NOTE — ED Notes (Signed)
ED Provider at bedside. 

## 2017-11-06 NOTE — ED Notes (Signed)
Pt notified need of stool and urine specimen, hat in toilet for stool specimen and specimen cup at bedside. Pt verbalized understanding of this.

## 2017-11-06 NOTE — ED Notes (Signed)
Patient transported to MRI 

## 2017-11-06 NOTE — Discharge Instructions (Signed)
You have been seen today in the Emergency Department (ED)  for syncope (passing out).  Your workup including labs and EKG did not show a cause for your syncope and were overall reassuring.   ° °Your symptoms may be due to dehydration, so it is important that you drink plenty of non-alcoholic fluids. Emotional stress, pain, or overheating--especially if you have been standing--can make you faint. In these cases, fainting is usually not serious. But fainting can be a sign of a more serious problem. Therefore it is imperative that you follow up with your doctor in 1-2 days for further evaluation. ° °When should you call for help?  °Call 911 anytime you think you may need emergency care. For example, call if:  °You have symptoms of a heart problem. These may include:  °Chest pain or pressure.  °Severe trouble breathing.  °A fast or irregular heartbeat.  °Lightheadedness or sudden weakness.  °Coughing up pink, foamy mucus.  °Passing out. °You have symptoms of a stroke. These may include:  °Sudden numbness, tingling, weakness, or loss of movement in your face, arm, or leg, especially on only one side of your body.  °Sudden vision changes.  °Sudden trouble speaking.  °Sudden confusion or trouble understanding simple statements.  °Sudden problems with walking or balance.  °A sudden, severe headache that is different from past headaches. °You passed out (lost consciousness) again. ° °Watch closely for changes in your health, and be sure to contact your doctor if:  °You do not get better as expected. ° ° How can you care for yourself at home?  °Drink plenty of fluids to prevent dehydration. If you have kidney, heart, or liver disease and have to limit fluids, talk with your doctor before you increase your fluid intake. ° ° °
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
## 2017-11-09 ENCOUNTER — Ambulatory Visit: Payer: Self-pay

## 2017-11-09 NOTE — Telephone Encounter (Signed)
  Reason for Disposition . Simple fainting is a chronic symptom (has occurred multiple times)  Answer Assessment - Initial Assessment Questions 1. ONSET: "How long were you unconscious?" (minutes) "When did it happen?"     Unknown 2. CONTENT: "What happened during period of unconsciousness?" (e.g., seizure activity)      No 3. MENTAL STATUS: "Alert and oriented now?" (oriented x 3 = name, month, location)      Alert and oriented - happened 11/06/17 4. TRIGGER: "What do you think caused the fainting?" "What were you doing just before you fainted?"  (e.g., exercise, sudden standing up, prolonged standing)     One time was using commode; one time was at work 5. RECURRENT SYMPTOM: "Have you ever passed out before?" If so, ask: "When was the last time?" and "What happened that time?"      No 6. INJURY: "Did you sustain any injury during the fall?"      No 7. CARDIAC SYMPTOMS: "Have you had any of the following symptoms: chest pain, difficulty breathing, palpitations?"     History of blood clots 8. NEUROLOGIC SYMPTOMS: "Have you had any of the following symptoms: headache, numbness, vertigo, weakness?"     Weakness and headaches 9. GI SYMPTOMS: "Have you had any of the following symptoms: abdominal pain, vomiting, diarrhea, blood in stools?"     Vomiting and diarrhea 10. OTHER SYMPTOMS: "Do you have any other symptoms?"       No 11. PREGNANCY: "Is there any chance you are pregnant?" "When was your last menstrual period?"       No  Protocols used: FAINTING-A-AH Pt. Seen in ED 11/06/17. Refused admission. Request an appointment sooner than the one he scheduled already.

## 2017-11-11 ENCOUNTER — Ambulatory Visit: Payer: Self-pay | Admitting: Family Medicine

## 2017-11-19 DIAGNOSIS — J019 Acute sinusitis, unspecified: Secondary | ICD-10-CM | POA: Diagnosis not present

## 2017-11-19 DIAGNOSIS — J209 Acute bronchitis, unspecified: Secondary | ICD-10-CM | POA: Diagnosis not present

## 2017-11-19 DIAGNOSIS — B9689 Other specified bacterial agents as the cause of diseases classified elsewhere: Secondary | ICD-10-CM | POA: Diagnosis not present

## 2017-11-24 ENCOUNTER — Other Ambulatory Visit: Payer: Self-pay | Admitting: Internal Medicine

## 2017-11-25 ENCOUNTER — Telehealth: Payer: Self-pay | Admitting: Internal Medicine

## 2017-11-25 ENCOUNTER — Other Ambulatory Visit: Payer: Self-pay | Admitting: Internal Medicine

## 2017-11-25 DIAGNOSIS — I825Y2 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: Secondary | ICD-10-CM

## 2017-11-25 NOTE — Telephone Encounter (Signed)
Lab order have been entered.

## 2017-11-25 NOTE — Telephone Encounter (Signed)
Jonathon Snow- Patient needs to be seen prior to starting Eliquis again; please make an appointment with me ASAP; labs-cbc/cmp.

## 2017-11-25 NOTE — Telephone Encounter (Signed)
Jonathon Snow, please schedule lab/md apt asap and contact pt with apt time

## 2017-11-25 NOTE — Telephone Encounter (Signed)
Patient has not been seen in over a year and has no follow up appts scheduled with physician either. He has not had this medicine filled from [place] since January and should have been out of medicine in June, but some has documented that he is only taking it once a day. Please advise

## 2017-11-27 ENCOUNTER — Ambulatory Visit: Payer: 59 | Admitting: Family Medicine

## 2017-11-27 ENCOUNTER — Encounter: Payer: 59 | Admitting: Family Medicine

## 2017-12-02 ENCOUNTER — Ambulatory Visit: Payer: 59 | Admitting: Family Medicine

## 2017-12-05 ENCOUNTER — Other Ambulatory Visit: Payer: Self-pay | Admitting: Internal Medicine

## 2017-12-08 ENCOUNTER — Ambulatory Visit: Payer: 59 | Admitting: Family Medicine

## 2017-12-08 DIAGNOSIS — H109 Unspecified conjunctivitis: Secondary | ICD-10-CM | POA: Diagnosis not present

## 2017-12-08 DIAGNOSIS — R05 Cough: Secondary | ICD-10-CM | POA: Diagnosis not present

## 2017-12-08 DIAGNOSIS — J019 Acute sinusitis, unspecified: Secondary | ICD-10-CM | POA: Diagnosis not present

## 2017-12-16 ENCOUNTER — Ambulatory Visit: Payer: 59 | Admitting: Family Medicine

## 2017-12-25 ENCOUNTER — Telehealth: Payer: Self-pay | Admitting: *Deleted

## 2017-12-25 ENCOUNTER — Other Ambulatory Visit: Payer: Self-pay | Admitting: Internal Medicine

## 2017-12-25 ENCOUNTER — Other Ambulatory Visit: Payer: Self-pay | Admitting: *Deleted

## 2017-12-25 MED ORDER — APIXABAN 5 MG PO TABS: ORAL_TABLET | ORAL | 0 refills | Status: DC

## 2017-12-25 NOTE — Telephone Encounter (Signed)
Patient called to report that he is completely out of his medicine. I explained to him that doctor has said he will not refill until he is seen since it has been more than a year since patient was here. I spoke with Vickki Muff RN who said we could see him today and the patient declined appointment stating he is on his way to work and cannot come in. I had suggested that he could go to ER or Urgent care to see if he could get enough until his appointment Wed. He did not seem to want to do that either. He thanked me for trying to help and hung up.  I then spoke to Vickki Muff again to let her know that he will not be coming in today. She spoke with physician again and he agreed to give patient enough medicine to last until he comes Wed.  I called patient and let him know we would give hm enough medicine toil Wed and that if he does not come in [place], we will not be giving more. I asked how he is taking Eliquis and he stated 1 tablet a day. Prescription for # 5 tabs sent to pharmacy. Patient thanked me again

## 2017-12-25 NOTE — Telephone Encounter (Signed)
Prior auth submitted for eliquis 5 mg (clinical key: [id number]) - pending insurance approval

## 2017-12-28 NOTE — Telephone Encounter (Signed)
Approvedon January 4  CaseId:[id number];Status:Approved;Review Type:Prior Auth;Coverage Start Date:11/25/2017;Coverage End Date:12/25/2018;

## 2017-12-29 DIAGNOSIS — R55 Syncope and collapse: Secondary | ICD-10-CM | POA: Insufficient documentation

## 2017-12-29 NOTE — Progress Notes (Deleted)
Cardiology Office Note  Date:  12/29/2017   ID:  Jonathon Snow, DOB January 10, 1961, MRN [id number]  PCP:  Roselee Nova, MD   No chief complaint on file.   HPI:    DVT  Hyperlipidemia  Hypertension  Lupus  Pulmonary embolism (HCC)  Renal disorder    abdominal cramping, vomiting and diarrhea on and off for 3 weeks.   2 syncopal episodes while walking but has not sustained any injuries from those.   CT ABD 11/06/2017 CT chest 04/2017     PMH:   has a past medical history of DVT (deep venous thrombosis) ([place]), Hyperlipidemia, Hypertension, Lupus, Pulmonary embolism ([place]), and Renal disorder.  PSH:    Past Surgical History:  Procedure Laterality Date  . ANKLE SURGERY Right   . CYST EXCISION  92 or 93    Liver cyst removal [facility]  . I&D EXTREMITY Right 04/29/2017   Procedure: IRRIGATION AND DEBRIDEMENT EXTREMITY;  Surgeon: Clayburn Pert, MD;  Location: [facility] ORS;  Service: General;  Laterality: Right;  . IRRIGATION AND DEBRIDEMENT ABSCESS Left 04/29/2017   Procedure: IRRIGATION AND DEBRIDEMENT Scrotal ABSCESS;  Surgeon: Clayburn Pert, MD;  Location: [facility] ORS;  Service: General;  Laterality: Left;    Current Outpatient Medications  Medication Sig Dispense Refill  . amLODipine (NORVASC) 2.5 MG tablet Take 2.5 mg by mouth at bedtime.    [name] apixaban (ELIQUIS) 5 MG TABS tablet Take 5 mg by mouth once a day 5 tablet 0  . diphenhydrAMINE (BENADRYL) 25 mg capsule Take 1 capsule (25 mg total) by mouth every 8 (eight) hours as needed. (Patient not taking: Reported on 10/18/2017) 10 capsule 0  . loperamide (IMODIUM A-D) 2 MG tablet Take 1 tablet (2 mg total) 4 (four) times daily as needed by mouth for diarrhea or loose stools. 30 tablet 0  . losartan (COZAAR) 100 MG tablet Take 100 mg by mouth daily.  6  . Multiple Vitamins-Minerals (MULTIVITAMIN WITH MINERALS) tablet Take 1 tablet by mouth daily.    . mupirocin ointment (BACTROBAN) 2 % Place 1 application into the nose 2 (two) times  daily. (Patient not taking: Reported on 10/18/2017) 22 g 0  . mycophenolate (CELLCEPT) 500 MG tablet Take 1,000 mg by mouth 2 (two) times daily.    . ondansetron (ZOFRAN ODT) 4 MG disintegrating tablet Take 1 tablet (4 mg total) every 8 (eight) hours as needed by mouth for nausea or vomiting. 20 tablet 0  . pantoprazole (PROTONIX) 40 MG tablet TAKE 1 TABLET BY MOUTH EVERY DAY (Patient not taking: Reported on 10/18/2017) 30 tablet 0  . prednisoLONE 5 MG TABS tablet Take 10 mg by mouth daily.    . promethazine (PHENERGAN) 50 MG tablet Take 0.5 tablets (25 mg total) by mouth every 6 (six) hours as needed for nausea or vomiting. (Patient not taking: Reported on 10/18/2017) 30 tablet 0  . rosuvastatin (CRESTOR) 10 MG tablet Take 1 tablet (10 mg total) by mouth at bedtime. (Patient not taking: Reported on 10/18/2017) 90 tablet 0  . traMADol (ULTRAM) 50 MG tablet Take 2 tablets (100 mg total) by mouth every 6 (six) hours as needed for moderate pain. (Patient not taking: Reported on 10/18/2017) 30 tablet 0   No current facility-administered medications for this visit.      Allergies:   Hydrocodone and Vicodin [hydrocodone-acetaminophen]   Social History:  The patient  reports that  has never smoked. he has never used smokeless tobacco. He reports that he does not drink  alcohol or use drugs.   Family History:   family history includes Hypertension in his father.    Review of Systems: ROS   PHYSICAL EXAM: VS:  There were no vitals taken for this visit. , BMI There is no height or weight on file to calculate BMI. GEN: Well nourished, well developed, in no acute distress HEENT: normal Neck: no JVD, carotid bruits, or masses Cardiac: RRR; no murmurs, rubs, or gallops,no edema  Respiratory:  clear to auscultation bilaterally, normal work of breathing GI: soft, nontender, nondistended, + BS MS: no deformity or atrophy Skin: warm and dry, no rash Neuro:  Strength and sensation are intact Psych:  euthymic mood, full affect    Recent Labs: 10/18/2017: B Natriuretic Peptide 19.7 11/06/2017: ALT 17; BUN 27; Creatinine, Ser 1.57; Hemoglobin 12.8; Platelets 167; Potassium 4.1; Sodium 139    Lipid Panel Lab Results  Component Value Date   CHOL 236 (H) 10/18/2017   HDL 41 10/18/2017   LDLCALC 170 (H) 10/18/2017   TRIG 126 10/18/2017      Wt Readings from Last 3 Encounters:  11/06/17 240 lb (108.9 kg)  10/18/17 245 lb (111.1 kg)  08/06/17 243 lb 11.2 oz (110.5 kg)       ASSESSMENT AND PLAN:  No diagnosis found.   Disposition:   F/U  6 months  No orders of the defined types were placed in this encounter.    Signed, Esmond Plants, M.D., Ph.D. 12/29/2017  [place], [place]

## 2017-12-30 ENCOUNTER — Ambulatory Visit: Payer: 59 | Admitting: Cardiovascular Disease

## 2017-12-30 ENCOUNTER — Inpatient Hospital Stay: Payer: 59

## 2017-12-30 ENCOUNTER — Inpatient Hospital Stay: Payer: 59 | Admitting: Internal Medicine

## 2017-12-30 NOTE — Progress Notes (Deleted)
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   Patient denies any blood in stools black red stools.  No new blood clots no swelling in the legs. No falls.   REVIEW OF SYSTEMS:  A complete 10 point review of system is done which is negative except mentioned above/history of present illness.   PAST MEDICAL HISTORY :  Past Medical History:  Diagnosis Date  . DVT (deep venous thrombosis) ([place])   . Hyperlipidemia   . Hypertension   . Lupus   . Pulmonary embolism ([place])   . Renal disorder    Stage III    PAST SURGICAL HISTORY :   Past Surgical History:  Procedure Laterality Date  . ANKLE SURGERY Right   . CYST EXCISION  92 or 93    Liver cyst removal [facility]  . I&D EXTREMITY Right 04/29/2017   Procedure: IRRIGATION AND DEBRIDEMENT EXTREMITY;  Surgeon: Clayburn Pert, MD;  Location: [facility] ORS;  Service: General;  Laterality: Right;  . IRRIGATION AND DEBRIDEMENT ABSCESS Left 04/29/2017   Procedure: IRRIGATION AND DEBRIDEMENT Scrotal ABSCESS;  Surgeon: Clayburn Pert, MD;  Location: [facility] ORS;  Service: General;  Laterality: Left;    FAMILY HISTORY :   Family History  Problem Relation Age of Onset  . Hypertension Father     SOCIAL HISTORY:   Social History   Tobacco Use  . Smoking status: Never Smoker  . Smokeless tobacco: Never Used  Substance Use Topics  . Alcohol use: No  . Drug use: No    ALLERGIES:   is allergic to hydrocodone and vicodin [hydrocodone-acetaminophen].  MEDICATIONS:  Current Outpatient Medications  Medication Sig Dispense Refill  . amLODipine (NORVASC) 2.5 MG tablet Take 2.5 mg by mouth at bedtime.    [name] apixaban (ELIQUIS) 5 MG TABS tablet Take 5 mg by mouth once a day 5 tablet 0  . diphenhydrAMINE (BENADRYL) 25 mg capsule Take 1 capsule (25 mg total) by mouth every 8 (eight) hours as needed. (Patient not taking: Reported on 10/18/2017) 10 capsule 0  . loperamide (IMODIUM A-D) 2 MG tablet Take 1 tablet (2 mg total) 4 (four) times daily as needed by mouth for diarrhea or loose stools. 30 tablet 0  . losartan (COZAAR) 100 MG tablet Take 100 mg by mouth daily.  6  . Multiple Vitamins-Minerals (MULTIVITAMIN WITH MINERALS) tablet Take 1 tablet by mouth daily.    . mupirocin ointment (BACTROBAN) 2 % Place 1 application into the nose 2 (two) times daily. (Patient not taking: Reported on 10/18/2017) 22 g 0  . mycophenolate (CELLCEPT) 500 MG tablet Take 1,000 mg by mouth 2 (two) times daily.    . ondansetron (ZOFRAN ODT) 4 MG disintegrating tablet Take 1 tablet (4 mg total) every 8 (eight) hours as needed by mouth for nausea or vomiting. 20 tablet 0  . pantoprazole (PROTONIX) 40 MG tablet TAKE 1 TABLET BY MOUTH EVERY DAY (Patient not taking: Reported on 10/18/2017) 30 tablet 0  .  prednisoLONE 5 MG TABS tablet Take 10 mg by mouth daily.    . promethazine (PHENERGAN) 50 MG tablet Take 0.5 tablets (25 mg total) by mouth every 6 (six) hours as needed for nausea or vomiting. (Patient not taking: Reported on 10/18/2017) 30 tablet 0  . rosuvastatin (CRESTOR) 10 MG tablet Take 1 tablet (10 mg total) by mouth at bedtime. (Patient not taking: Reported on 10/18/2017) 90 tablet 0  . traMADol (ULTRAM) 50 MG tablet Take 2 tablets (100 mg total) by mouth every 6 (six) hours as needed for moderate pain. (Patient not taking: Reported on 10/18/2017) 30 tablet 0   No current facility-administered  medications for this visit.     PHYSICAL EXAMINATION: ECOG PERFORMANCE STATUS: 1 - Symptomatic but completely ambulatory  There were no vitals taken for this visit.  There were no vitals filed for this visit.  GENERAL: Well-nourished well-developed; Alert, no distress and comfortable.  Alone.  EYES: no pallor or icterus OROPHARYNX: no thrush or ulceration; good dentition  NECK: supple, no masses felt LYMPH:  no palpable lymphadenopathy in the cervical, axillary or inguinal regions LUNGS: clear to auscultation and  No wheeze or crackles HEART/CVS: regular rate & rhythm and no murmurs; No lower extremity edema ABDOMEN:abdomen soft, non-tender and normal bowel sounds Musculoskeletal:no cyanosis of digits and no clubbing  PSYCH: alert & oriented x 3 with fluent speech NEURO: no focal motor/sensory deficits SKIN: Ecchymosis noted. No active bleeding.  LABORATORY DATA:  I have reviewed the data as listed    Component Value Date/Time   NA 139 11/06/2017 0538   NA 141 04/17/2016 0924   NA 138 04/10/2015 1036   K 4.1 11/06/2017 0538   K 3.5 04/10/2015 1036   CL 109 11/06/2017 0538   CL 108 04/10/2015 1036   CO2 22 11/06/2017 0538   CO2 25 04/10/2015 1036   GLUCOSE 110 (H) 11/06/2017 0538   GLUCOSE 96 04/10/2015 1036   BUN 27 (H) 11/06/2017 0538   BUN 23 04/17/2016 0924   BUN 16 04/10/2015 1036   CREATININE 1.57 (H) 11/06/2017 0538   CREATININE 2.50 (H) 05/12/2017 1132   CALCIUM 8.7 (L) 11/06/2017 0538   CALCIUM 8.4 (L) 04/10/2015 1036   PROT 6.5 11/06/2017 0538   PROT 6.9 04/17/2016 0924   PROT 7.0 04/10/2015 1036   ALBUMIN 3.5 11/06/2017 0538   ALBUMIN 3.9 04/17/2016 0924   ALBUMIN 3.9 04/10/2015 1036   AST 26 11/06/2017 0538   AST 19 04/10/2015 1036   ALT 17 11/06/2017 0538   ALT 16 (L) 04/10/2015 1036   ALKPHOS 70 11/06/2017 0538   ALKPHOS 71 04/10/2015 1036   BILITOT 0.8 11/06/2017 0538   BILITOT 0.4 04/17/2016 0924   BILITOT 0.7 04/10/2015 1036   GFRNONAA 48  (L) 11/06/2017 0538   GFRNONAA 28 (L) 05/12/2017 1132   GFRAA 56 (L) 11/06/2017 0538   GFRAA 32 (L) 05/12/2017 1132    No results found for: SPEP, UPEP  Lab Results  Component Value Date   WBC 4.6 11/06/2017   NEUTROABS 4.7 10/18/2017   HGB 12.8 (L) 11/06/2017   HCT 40.1 11/06/2017   MCV 88.7 11/06/2017   PLT 167 11/06/2017      Chemistry      Component Value Date/Time   NA 139 11/06/2017 0538   NA 141 04/17/2016 0924   NA 138 04/10/2015 1036   K 4.1 11/06/2017 0538   K 3.5 04/10/2015 1036   CL 109 11/06/2017 0538   CL 108  04/10/2015 1036   CO2 22 11/06/2017 0538   CO2 25 04/10/2015 1036   BUN 27 (H) 11/06/2017 0538   BUN 23 04/17/2016 0924   BUN 16 04/10/2015 1036   CREATININE 1.57 (H) 11/06/2017 0538   CREATININE 2.50 (H) 05/12/2017 1132      Component Value Date/Time   CALCIUM 8.7 (L) 11/06/2017 0538   CALCIUM 8.4 (L) 04/10/2015 1036   ALKPHOS 70 11/06/2017 0538   ALKPHOS 71 04/10/2015 1036   AST 26 11/06/2017 0538   AST 19 04/10/2015 1036   ALT 17 11/06/2017 0538   ALT 16 (L) 04/10/2015 1036   BILITOT 0.8 11/06/2017 0538   BILITOT 0.4 04/17/2016 0924   BILITOT 0.7 04/10/2015 1036         ASSESSMENT & PLAN:  No problem-specific Assessment & Plan notes found for this encounter.     Cammie Sickle, MD 12/30/2017 8:20 AM

## 2017-12-30 NOTE — Assessment & Plan Note (Deleted)
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 Tolerating well.   # Mild chronic intermittent thrombocytopenia- ? Sec to immune process- monitor for now.   # History of nephritis/lupus- following up with nephrology.  # follow up in 6 months/labs

## 2017-12-31 ENCOUNTER — Encounter: Payer: Self-pay | Admitting: Family Medicine

## 2017-12-31 ENCOUNTER — Ambulatory Visit: Payer: Self-pay | Admitting: *Deleted

## 2017-12-31 ENCOUNTER — Ambulatory Visit: Payer: 59 | Admitting: Family Medicine

## 2017-12-31 VITALS — BP 154/86 | HR 112 | Temp 98.4°F | Resp 18 | Ht 74.0 in | Wt 234.1 lb

## 2017-12-31 DIAGNOSIS — R079 Chest pain, unspecified: Secondary | ICD-10-CM

## 2017-12-31 DIAGNOSIS — F322 Major depressive disorder, single episode, severe without psychotic features: Secondary | ICD-10-CM

## 2017-12-31 DIAGNOSIS — F4322 Adjustment disorder with anxiety: Secondary | ICD-10-CM

## 2017-12-31 LAB — CBC WITH DIFFERENTIAL/PLATELET
Basophils Absolute: 19 cells/uL (ref 0–200)
Basophils Relative: 0.2 %
Eosinophils Absolute: 74 cells/uL (ref 15–500)
Eosinophils Relative: 0.8 %
HEMATOCRIT: 42.5 % (ref 38.5–50.0)
Hemoglobin: 14 g/dL (ref 13.2–17.1)
LYMPHS ABS: 967 {cells}/uL (ref 850–3900)
MCH: 28.9 pg (ref 27.0–33.0)
MCHC: 32.9 g/dL (ref 32.0–36.0)
MCV: 87.8 fL (ref 80.0–100.0)
MPV: 11.9 fL (ref 7.5–12.5)
Monocytes Relative: 12.2 %
NEUTROS PCT: 76.4 %
Neutro Abs: 7105 cells/uL (ref 1500–7800)
Platelets: 203 10*3/uL (ref 140–400)
RBC: 4.84 10*6/uL (ref 4.20–5.80)
RDW: 14.1 % (ref 11.0–15.0)
Total Lymphocyte: 10.4 %
WBC mixed population: 1135 cells/uL — ABNORMAL HIGH (ref 200–950)
WBC: 9.3 10*3/uL (ref 3.8–10.8)

## 2017-12-31 LAB — TSH: TSH: 0.6 mIU/L (ref 0.40–4.50)

## 2017-12-31 MED ORDER — BUSPIRONE HCL 7.5 MG PO TABS: 7.5000 mg | ORAL_TABLET | Freq: Two times a day (BID) | ORAL | 0 refills | Status: DC

## 2017-12-31 MED ORDER — SERTRALINE HCL 50 MG PO TABS: 50.0000 mg | ORAL_TABLET | Freq: Every day | ORAL | 0 refills | Status: DC

## 2017-12-31 NOTE — Telephone Encounter (Signed)
Called in c/o anxiety and depression since his mother passed away 2023-12-19 a year ago.   "I cry over everything"   "I just can't seem to get over this"   "It's getting harder to deal with people".   He is wanting a referral to a therapist today at his 11:40 appt with Dr. Manuella Ghazi.   I instructed him to call us back if his symptoms became worse. Reason for Disposition . Symptoms interfere with work or school  Answer Assessment - Initial Assessment Questions 1. CONCERN: "What happened that made you call today?"     My Mom passed away 19-Dec-2023 a year ago.   I've never really gotten over that. 2. DEPRESSION SYMPTOM SCREENING: "How are you feeling overall?" (e.g., decreased energy, increased sleeping or difficulty sleeping, difficulty concentrating, feelings of sadness, guilt, hopelessness, or worthlessness)     Anxiety and depression.  I cry at nothing.  I have the shakes.  I have a hard time being around people period.  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  It's getting harder to and deal with people. 3. RISK OF HARM - SUICIDAL IDEATION:  "Do you ever have thoughts of hurting or killing yourself?"  (e.g., yes, no, no but preoccupation with thoughts about death)   - INTENT:  "Do you have thoughts of hurting or killing yourself right NOW?" (e.g., yes, no, N/A)   - PLAN: "Do you have a specific plan for how you would do this?" (e.g., gun, knife, overdose, no plan, N/A)     Not at this point 4. RISK OF HARM - HOMICIDAL IDEATION:  "Do you ever have thoughts of hurting or killing someone else?"  (e.g., yes, no, no but preoccupation with thoughts about death) Not at this point   - INTENT:  "Do you have thoughts of hurting or killing someone right NOW?" (e.g., yes, no, N/A)   - PLAN: "Do you have a specific plan for how you would do this?" (e.g., gun, knife, no plan, N/A)      "Not at this point" 5. FUNCTIONAL IMPAIRMENT: "How have things been going for you overall in your life? 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 6. SUPPORT: "Who is with you now?" "Who do you live with?" "Do you have family or friends nearby who you can talk to?"      By myself 7. THERAPIST: "Do you have a counselor or therapist? Name?"     I had an appt before to see my doctor about getting a referral.  I couldn't make the appt.   I been missing work due to the depression.   I don't want to lose my job.   I'm hoping Dr. Manuella Ghazi can give me a referral to a therapist today. 8. STRESSORS: "Has there been any new stress or recent changes in your life?"     No other than I'm missing work.  I don't want to lose my job. 9. DRUG ABUSE/ALCOHOL: "Do you drink alcohol or use any illegal drugs?"      Not really.  I've been drinking little bit.    Not on a daily basis. 10. OTHER: "Do you have any other health  or medical symptoms right now?" (e.g., fever)       No other problems.    11. PREGNANCY: "Is there any chance you are pregnant?" "When was your last menstrual period?"       N/A  Protocols used: DEPRESSION-A-AH

## 2017-12-31 NOTE — Telephone Encounter (Signed)
We'll review patient's symptoms today and provide referral to the therapist if appropriate.

## 2017-12-31 NOTE — Progress Notes (Signed)
Name: Jonathon Snow   MRN: [id number]    DOB: 27-Sep-1961   Date:12/31/2017       Progress Note  Subjective  Chief Complaint  Chief Complaint  Patient presents with  . Grieving    Recently losted his mother and his very upset  . Depression    Anxiety  Presents for initial visit. The problem has been rapidly worsening. Symptoms include depressed mood, excessive worry, insomnia, malaise, nervous/anxious behavior, panic and restlessness. Patient reports no chest pain (currently expereincing chest pain in the left anterior chest.), feeling of choking or suicidal ideas. The severity of symptoms is moderate. The symptoms are aggravated by family issues (has always had some level of anxiety but the recent demise of his mother made them much worse, he is having difficulty coping with the situation, is nervous around people, had o leave work, a couple of time, just feels 'not himself'). The quality of sleep is poor.    Depression         This is a recurrent problem.  The onset quality is gradual.   The problem has been gradually worsening since onset.  Associated symptoms include fatigue, helplessness, hopelessness, insomnia, restlessness, decreased interest and sad.  Associated symptoms include no suicidal ideas.  Past treatments include nothing.  Risk factors include major life event (mother passed away recently and has had difficulty with depression).   Past medical history includes anxiety.     Past Medical History:  Diagnosis Date  . DVT (deep venous thrombosis) ([place])   . Hyperlipidemia   . Hypertension   . Lupus   . Pulmonary embolism ([place])   . Renal disorder    Stage III    Past Surgical History:  Procedure Laterality Date  . ANKLE SURGERY Right   . CYST EXCISION  92 or 93    Liver cyst removal [facility]  . I&D EXTREMITY Right 04/29/2017   Procedure: IRRIGATION AND DEBRIDEMENT EXTREMITY;  Surgeon: Clayburn Pert, MD;  Location: [facility] ORS;  Service: General;  Laterality: Right;  .  IRRIGATION AND DEBRIDEMENT ABSCESS Left 04/29/2017   Procedure: IRRIGATION AND DEBRIDEMENT Scrotal ABSCESS;  Surgeon: Clayburn Pert, MD;  Location: [facility] ORS;  Service: General;  Laterality: Left;    Family History  Problem Relation Age of Onset  . Hypertension Father     Social History   Socioeconomic History  . Marital status: Divorced    Spouse name: Not on file  . Number of children: Not on file  . Years of education: Not on file  . Highest education level: Not on file  Social Needs  . Financial resource strain: Not on file  . Food insecurity - worry: Not on file  . Food insecurity - inability: Not on file  . Transportation needs - medical: Not on file  . Transportation needs - non-medical: Not on file  Occupational History  . Not on file  Tobacco Use  . Smoking status: Never Smoker  . Smokeless tobacco: Never Used  Substance and Sexual Activity  . Alcohol use: No  . Drug use: No  . Sexual activity: Not on file  Other Topics Concern  . Not on file  Social History Narrative  . Not on file     Current Outpatient Medications:  .  amLODipine (NORVASC) 2.5 MG tablet, Take 2.5 mg by mouth at bedtime., Disp: , Rfl:  .  apixaban (ELIQUIS) 5 MG TABS tablet, Take 5 mg by mouth once a day, Disp: 5 tablet, Rfl: 0 .  losartan (COZAAR) 100 MG tablet, Take 100 mg by mouth daily., Disp: , Rfl: 6 .  Multiple Vitamins-Minerals (MULTIVITAMIN WITH MINERALS) tablet, Take 1 tablet by mouth daily., Disp: , Rfl:  .  mycophenolate (CELLCEPT) 500 MG tablet, Take 1,000 mg by mouth 2 (two) times daily., Disp: , Rfl:  .  predniSONE (DELTASONE) 10 MG tablet, Take 1 tablet by mouth daily., Disp: , Rfl:  .  diphenhydrAMINE (BENADRYL) 25 mg capsule, Take 1 capsule (25 mg total) by mouth every 8 (eight) hours as needed. (Patient not taking: Reported on 10/18/2017), Disp: 10 capsule, Rfl: 0 .  loperamide (IMODIUM A-D) 2 MG tablet, Take 1 tablet (2 mg total) 4 (four) times daily as needed by mouth for  diarrhea or loose stools. (Patient not taking: Reported on 12/31/2017), Disp: 30 tablet, Rfl: 0 .  mupirocin ointment (BACTROBAN) 2 %, Place 1 application into the nose 2 (two) times daily. (Patient not taking: Reported on 10/18/2017), Disp: 22 g, Rfl: 0 .  ondansetron (ZOFRAN ODT) 4 MG disintegrating tablet, Take 1 tablet (4 mg total) every 8 (eight) hours as needed by mouth for nausea or vomiting. (Patient not taking: Reported on 12/31/2017), Disp: 20 tablet, Rfl: 0 .  pantoprazole (PROTONIX) 40 MG tablet, TAKE 1 TABLET BY MOUTH EVERY DAY (Patient not taking: Reported on 10/18/2017), Disp: 30 tablet, Rfl: 0 .  prednisoLONE 5 MG TABS tablet, Take 10 mg by mouth daily., Disp: , Rfl:  .  promethazine (PHENERGAN) 50 MG tablet, Take 0.5 tablets (25 mg total) by mouth every 6 (six) hours as needed for nausea or vomiting. (Patient not taking: Reported on 10/18/2017), Disp: 30 tablet, Rfl: 0 .  rosuvastatin (CRESTOR) 10 MG tablet, Take 1 tablet (10 mg total) by mouth at bedtime. (Patient not taking: Reported on 10/18/2017), Disp: 90 tablet, Rfl: 0 .  traMADol (ULTRAM) 50 MG tablet, Take 2 tablets (100 mg total) by mouth every 6 (six) hours as needed for moderate pain. (Patient not taking: Reported on 10/18/2017), Disp: 30 tablet, Rfl: 0  Allergies  Allergen Reactions  . Hydrocodone Rash  . Vicodin [Hydrocodone-Acetaminophen] Hives and Rash    Severe headaches (also)     Review of Systems  Constitutional: Positive for fatigue.  Cardiovascular: Negative for chest pain (currently expereincing chest pain in the left anterior chest.).  Psychiatric/Behavioral: Positive for depression. Negative for suicidal ideas. The patient is nervous/anxious and has insomnia.       Objective  Vitals:   12/31/17 1156  BP: (!) 154/86  Pulse: (!) 112  Resp: 18  Temp: 98.4 F (36.9 C)  TempSrc: Oral  SpO2: 95%  Weight: 234 lb 1.6 oz (106.2 kg)  Height: 6\' 2"  (1.88 m)    Physical Exam  Constitutional: He is  oriented to person, place, and time and well-developed, well-nourished, and in no distress.  HENT:  Head: Normocephalic and atraumatic.  Cardiovascular: Regular rhythm, S1 normal and S2 normal. Tachycardia present.  Pulmonary/Chest: Effort normal and breath sounds normal. No respiratory distress. He has no wheezes. He exhibits tenderness.    Abdominal: Soft. Bowel sounds are normal. There is no tenderness.  Musculoskeletal: Normal range of motion.  Neurological: He is alert and oriented to person, place, and time.  Psychiatric: Memory normal. His mood appears anxious. He is agitated. He exhibits a depressed mood.        Assessment & Plan  1. Chest pain, unspecified type EKG showing, chest pain is likely because of anxiety versus costochondritis, less likely to be  from lung pathology, reassured and advised to seek immediate medical attention if he has symptoms including worsening chest pain, dizziness, nausea, sweating etc. Patient verbalized agreement - EKG 12-Lead  2. Current severe episode of major depressive disorder without psychotic features without prior episode ([place]) Active major depression based on symptoms and PHQ-9 start on sertraline, referral to psychiatry - sertraline (ZOLOFT) 50 MG tablet; Take 1 tablet (50 mg total) by mouth daily.  Dispense: 30 tablet; Refill: 0 - Ambulatory referral to Psychiatry  3. Persistent adjustment disorder with anxiety  - busPIRone (BUSPAR) 7.5 MG tablet; Take 1 tablet (7.5 mg total) by mouth 2 (two) times daily.  Dispense: 60 tablet; Refill: 0 - CBC with Differential/Platelet - TSH - Ambulatory referral to Psychiatry    Telly Broberg [name] 12/31/2017 12:03 PM

## 2018-01-05 ENCOUNTER — Encounter: Payer: Self-pay | Admitting: *Deleted

## 2018-01-06 ENCOUNTER — Telehealth: Payer: Self-pay | Admitting: *Deleted

## 2018-01-06 NOTE — Telephone Encounter (Signed)
-----   Message from [facility] sent at 01/06/2018  4:45 PM EST ----- Regarding: RE: RX and R/S Contact: [phone] thx ----- Message ----- From: Sabino Gasser, RN Sent: 01/06/2018   4:29 PM To: Elouise Munroe, Cammie Sickle, MD Subject: RE: RX and R/S                                 Patient must be scheduled to see NP and labs. He will not get any more medications until he comes to this apt. If he fails to keep this apt, then he will receive a letter for practice dismissal. -    ----- Message ----- From: Elouise Munroe Sent: 01/06/2018   2:28 PM To: Sabino Gasser, RN, Cammie Sickle, MD Subject: RX and R/S                                     Pt left me a VM about r/s appt from 12-30-17.  When I looked him up it stated Dr B discharged Mr Willden from our practice???  I think his call was about an RX.  Please advise

## 2018-01-07 ENCOUNTER — Telehealth: Payer: Self-pay | Admitting: Internal Medicine

## 2018-01-07 NOTE — Telephone Encounter (Signed)
Per Ander Purpura, NP - Her schedule is open on Tuesday. Please go ahead and schedule an apt.

## 2018-01-28 ENCOUNTER — Other Ambulatory Visit: Payer: Self-pay | Admitting: Family Medicine

## 2018-01-28 ENCOUNTER — Telehealth: Payer: Self-pay | Admitting: Family Medicine

## 2018-01-28 DIAGNOSIS — F4322 Adjustment disorder with anxiety: Secondary | ICD-10-CM

## 2018-01-28 DIAGNOSIS — F322 Major depressive disorder, single episode, severe without psychotic features: Secondary | ICD-10-CM

## 2018-01-28 NOTE — Telephone Encounter (Signed)
Copied from [place]. Topic: Quick Communication - See Telephone Encounter >> Jan 28, 2018  2:03 PM Percell Belt A wrote: CRM for notification. See Telephone encounter for: pt called in to let Dr know that he is taking his bp meds once daily.  He has some questions about a med that he was given.  He would like nurse to give him a call back.  He stated that he rec'd a call to call in ?     Best number [phone]  01/28/18.

## 2018-02-02 ENCOUNTER — Ambulatory Visit: Payer: 59 | Admitting: Family Medicine

## 2018-02-02 ENCOUNTER — Encounter: Payer: Self-pay | Admitting: Family Medicine

## 2018-02-02 VITALS — BP 152/98 | HR 82 | Temp 98.2°F | Resp 14 | Ht 74.0 in | Wt 238.5 lb

## 2018-02-02 DIAGNOSIS — R0982 Postnasal drip: Secondary | ICD-10-CM

## 2018-02-02 DIAGNOSIS — I10 Essential (primary) hypertension: Secondary | ICD-10-CM | POA: Diagnosis not present

## 2018-02-02 DIAGNOSIS — F332 Major depressive disorder, recurrent severe without psychotic features: Secondary | ICD-10-CM | POA: Diagnosis not present

## 2018-02-02 DIAGNOSIS — R059 Cough, unspecified: Secondary | ICD-10-CM

## 2018-02-02 DIAGNOSIS — R05 Cough: Secondary | ICD-10-CM | POA: Diagnosis not present

## 2018-02-02 DIAGNOSIS — J0100 Acute maxillary sinusitis, unspecified: Secondary | ICD-10-CM | POA: Diagnosis not present

## 2018-02-02 MED ORDER — BENZONATATE 200 MG PO CAPS
200.0000 mg | ORAL_CAPSULE | Freq: Two times a day (BID) | ORAL | 0 refills | Status: DC | PRN
Start: 2018-02-02 — End: 2018-03-16

## 2018-02-02 MED ORDER — PAROXETINE HCL 20 MG PO TABS: 20.0000 mg | ORAL_TABLET | Freq: Every day | ORAL | 0 refills | Status: DC

## 2018-02-02 MED ORDER — MOXIFLOXACIN HCL 400 MG PO TABS: 400.0000 mg | ORAL_TABLET | Freq: Every day | ORAL | 0 refills | Status: DC

## 2018-02-02 MED ORDER — MOMETASONE FUROATE 50 MCG/ACT NA SUSP
2.0000 | Freq: Every day | NASAL | 0 refills | Status: DC
Start: 2018-02-02 — End: 2018-03-16

## 2018-02-02 NOTE — Progress Notes (Signed)
Name: Jonathon Snow   MRN: [id number]    DOB: 1961-03-29   Date:02/02/2018       Progress Note  Subjective  Chief Complaint  Chief Complaint  Patient presents with  . Sinus Problem    coughing, head drainage, head pressure onset 4 weesk ago. Pt went to the walk in clinic and was given an antibotic but did not work   . Medication Management    Buspar is not working     Sinusitis  This is a new problem. The current episode started more than 1 month ago (6 weeks ago.). The problem is unchanged. There has been no fever. Associated symptoms include chills, congestion, coughing, headaches, sinus pressure and a sore throat. Pertinent negatives include no swollen glands. Past treatments include antibiotics (has taken antibiotics prescribed by [facility]  Depression       The patient presents with depression.  This is a chronic problem.The problem is unchanged.  Associated symptoms include fatigue, helplessness, insomnia, irritable, restlessness, decreased interest, headaches and sad.  Past treatments include SSRIs - Selective serotonin reuptake inhibitors.  Compliance with treatment is good.  Previous treatment provided no relief relief.  Past medical history includes depression.       Past Medical History:  Diagnosis Date  . DVT (deep venous thrombosis) ([place])   . Hyperlipidemia   . Hypertension   . Lupus   . Pulmonary embolism ([place])   . Renal disorder    Stage III    Past Surgical History:  Procedure Laterality Date  . ANKLE SURGERY Right   . CYST EXCISION  92 or 93    Liver cyst removal [facility]  . I&D EXTREMITY Right 04/29/2017   Procedure: IRRIGATION AND DEBRIDEMENT EXTREMITY;  Surgeon: Clayburn Pert, MD;  Location: [facility] ORS;  Service: General;  Laterality: Right;  . IRRIGATION AND DEBRIDEMENT ABSCESS Left 04/29/2017   Procedure: IRRIGATION AND DEBRIDEMENT Scrotal ABSCESS;  Surgeon: Clayburn Pert, MD;  Location: [facility] ORS;  Service: General;  Laterality: Left;    Family  History  Problem Relation Age of Onset  . Hypertension Father   . Heart disease Father   . Clotting disorder Mother   . Kidney disease Brother   . Heart attack Maternal Grandmother   . Heart attack Maternal Grandfather   . Heart attack Paternal Grandfather     Social History   Socioeconomic History  . Marital status: Divorced    Spouse name: Not on file  . Number of children: Not on file  . Years of education: Not on file  . Highest education level: Not on file  Social Needs  . Financial resource strain: Not on file  . Food insecurity - worry: Not on file  . Food insecurity - inability: Not on file  . Transportation needs - medical: Not on file  . Transportation needs - non-medical: Not on file  Occupational History  . Not on file  Tobacco Use  . Smoking status: Never Smoker  . Smokeless tobacco: Never Used  Substance and Sexual Activity  . Alcohol use: No  . Drug use: No  . Sexual activity: No  Other Topics Concern  . Not on file  Social History Narrative  . Not on file     Current Outpatient Medications:  .  amLODipine (NORVASC) 2.5 MG tablet, Take 2.5 mg by mouth at bedtime., Disp: , Rfl:  .  apixaban (ELIQUIS) 5 MG TABS tablet, Take 5 mg by mouth once a day, Disp: 5 tablet,  Rfl: 0 .  busPIRone (BUSPAR) 7.5 MG tablet, TAKE 1 TABLET (7.5 MG TOTAL) BY MOUTH 2 (TWO) TIMES DAILY., Disp: 60 tablet, Rfl: 0 .  losartan (COZAAR) 100 MG tablet, Take 100 mg by mouth daily., Disp: , Rfl: 6 .  Multiple Vitamins-Minerals (MULTIVITAMIN WITH MINERALS) tablet, Take 1 tablet by mouth daily., Disp: , Rfl:  .  mycophenolate (CELLCEPT) 500 MG tablet, Take 1,000 mg by mouth 2 (two) times daily., Disp: , Rfl:  .  predniSONE (DELTASONE) 10 MG tablet, Take 1 tablet by mouth daily., Disp: , Rfl:  .  sertraline (ZOLOFT) 50 MG tablet, TAKE 1 TABLET BY MOUTH EVERY DAY, Disp: 30 tablet, Rfl: 0 .  diphenhydrAMINE (BENADRYL) 25 mg capsule, Take 1 capsule (25 mg total) by mouth every 8 (eight)  hours as needed. (Patient not taking: Reported on 10/18/2017), Disp: 10 capsule, Rfl: 0 .  loperamide (IMODIUM A-D) 2 MG tablet, Take 1 tablet (2 mg total) 4 (four) times daily as needed by mouth for diarrhea or loose stools. (Patient not taking: Reported on 12/31/2017), Disp: 30 tablet, Rfl: 0 .  mupirocin ointment (BACTROBAN) 2 %, Place 1 application into the nose 2 (two) times daily. (Patient not taking: Reported on 10/18/2017), Disp: 22 g, Rfl: 0 .  ondansetron (ZOFRAN ODT) 4 MG disintegrating tablet, Take 1 tablet (4 mg total) every 8 (eight) hours as needed by mouth for nausea or vomiting. (Patient not taking: Reported on 12/31/2017), Disp: 20 tablet, Rfl: 0 .  pantoprazole (PROTONIX) 40 MG tablet, TAKE 1 TABLET BY MOUTH EVERY DAY (Patient not taking: Reported on 10/18/2017), Disp: 30 tablet, Rfl: 0 .  prednisoLONE 5 MG TABS tablet, Take 10 mg by mouth daily., Disp: , Rfl:  .  promethazine (PHENERGAN) 50 MG tablet, Take 0.5 tablets (25 mg total) by mouth every 6 (six) hours as needed for nausea or vomiting. (Patient not taking: Reported on 10/18/2017), Disp: 30 tablet, Rfl: 0 .  rosuvastatin (CRESTOR) 10 MG tablet, Take 1 tablet (10 mg total) by mouth at bedtime. (Patient not taking: Reported on 10/18/2017), Disp: 90 tablet, Rfl: 0 .  traMADol (ULTRAM) 50 MG tablet, Take 2 tablets (100 mg total) by mouth every 6 (six) hours as needed for moderate pain. (Patient not taking: Reported on 10/18/2017), Disp: 30 tablet, Rfl: 0  Allergies  Allergen Reactions  . Hydrocodone Rash  . Vicodin [Hydrocodone-Acetaminophen] Hives and Rash    Severe headaches (also)     Review of Systems  Constitutional: Positive for chills and fatigue.  HENT: Positive for congestion, sinus pressure and sore throat.   Respiratory: Positive for cough.   Neurological: Positive for headaches.  Psychiatric/Behavioral: Positive for depression. The patient has insomnia.       Objective  Vitals:   02/02/18 0927  BP: (!)  152/98  Pulse: 82  Resp: 14  Temp: 98.2 F (36.8 C)  TempSrc: Oral  SpO2: 99%  Weight: 238 lb 8 oz (108.2 kg)  Height: 6' 2" (1.88 m)    Physical Exam  Constitutional: He is oriented to person, place, and time and well-developed, well-nourished, and in no distress. He is irritable.  HENT:  Head: Normocephalic and atraumatic.  Right Ear: Tympanic membrane and ear canal normal.  Left Ear: Tympanic membrane and ear canal normal.  Nose: Right sinus exhibits maxillary sinus tenderness and frontal sinus tenderness. Left sinus exhibits maxillary sinus tenderness and frontal sinus tenderness.  Mouth/Throat: Posterior oropharyngeal erythema present.  Nasal mucosal inflammation, turbinate hypertrophy.  Cardiovascular: Normal rate,  regular rhythm, S1 normal, S2 normal and normal heart sounds.  No murmur heard. Pulmonary/Chest: Effort normal. No respiratory distress. He has no wheezes. He has rales in the right lower field and the left lower field.  Neurological: He is alert and oriented to person, place, and time.  Psychiatric: Memory and judgment normal. He exhibits a depressed mood. He has a flat affect.  Nursing note and vitals reviewed.     Recent Results (from the past 2160 hour(s))  Lipase, blood     Status: None   Collection Time: 11/06/17  5:38 AM  Result Value Ref Range   Lipase 46 11 - 51 U/L  Comprehensive metabolic panel     Status: Abnormal   Collection Time: 11/06/17  5:38 AM  Result Value Ref Range   Sodium 139 135 - 145 mmol/L   Potassium 4.1 3.5 - 5.1 mmol/L    Comment: HEMOLYSIS AT THIS LEVEL MAY AFFECT RESULT   Chloride 109 101 - 111 mmol/L   CO2 22 22 - 32 mmol/L   Glucose, Bld 110 (H) 65 - 99 mg/dL   BUN 27 (H) 6 - 20 mg/dL   Creatinine, Ser 1.57 (H) 0.61 - 1.24 mg/dL   Calcium 8.7 (L) 8.9 - 10.3 mg/dL   Total Protein 6.5 6.5 - 8.1 g/dL   Albumin 3.5 3.5 - 5.0 g/dL   AST 26 15 - 41 U/L   ALT 17 17 - 63 U/L   Alkaline Phosphatase 70 38 - 126 U/L   Total  Bilirubin 0.8 0.3 - 1.2 mg/dL   GFR calc non Af Amer 48 (L) >60 mL/min   GFR calc Af Amer 56 (L) >60 mL/min    Comment: (NOTE) The eGFR has been calculated using the CKD EPI equation. This calculation has not been validated in all clinical situations. eGFR's persistently <60 mL/min signify possible Chronic Kidney Disease.    Anion gap 8 5 - 15  CBC     Status: Abnormal   Collection Time: 11/06/17  5:38 AM  Result Value Ref Range   WBC 4.6 3.8 - 10.6 K/uL   RBC 4.52 4.40 - 5.90 MIL/uL   Hemoglobin 12.8 (L) 13.0 - 18.0 g/dL   HCT 40.1 40.0 - 52.0 %   MCV 88.7 80.0 - 100.0 fL   MCH 28.2 26.0 - 34.0 pg   MCHC 31.8 (L) 32.0 - 36.0 g/dL   RDW 15.4 (H) 11.5 - 14.5 %   Platelets 167 150 - 440 K/uL  Troponin I     Status: None   Collection Time: 11/06/17  6:12 AM  Result Value Ref Range   Troponin I <0.03 <0.03 ng/mL  CBC with Differential/Platelet     Status: Abnormal   Collection Time: 12/31/17 12:32 PM  Result Value Ref Range   WBC 9.3 3.8 - 10.8 Thousand/uL   RBC 4.84 4.20 - 5.80 Million/uL   Hemoglobin 14.0 13.2 - 17.1 g/dL   HCT 42.5 38.5 - 50.0 %   MCV 87.8 80.0 - 100.0 fL   MCH 28.9 27.0 - 33.0 pg   MCHC 32.9 32.0 - 36.0 g/dL   RDW 14.1 11.0 - 15.0 %   Platelets 203 140 - 400 Thousand/uL   MPV 11.9 7.5 - 12.5 fL   Neutro Abs 7,105 1,500 - 7,800 cells/uL   Lymphs Abs 967 850 - 3,900 cells/uL   WBC mixed population 1,135 (H) 200 - 950 cells/uL   Eosinophils Absolute 74 15 - 500 cells/uL   Basophils  Absolute 19 0 - 200 cells/uL   Neutrophils Relative % 76.4 %   Total Lymphocyte 10.4 %   Monocytes Relative 12.2 %   Eosinophils Relative 0.8 %   Basophils Relative 0.2 %  TSH     Status: None   Collection Time: 12/31/17 12:32 PM  Result Value Ref Range   TSH 0.60 0.40 - 4.50 mIU/L     Assessment & Plan  1. Severe episode of recurrent major depressive disorder, without psychotic features ([place]) Sertraline has been ineffective, DC sertraline and BuSpar, start on Paxil  20 mg daily - PARoxetine (PAXIL) 20 MG tablet; Take 1 tablet (20 mg total) by mouth daily.  Dispense: 90 tablet; Refill: 0  2. Post-nasal drainage Suspect subacute sinusitis, started on Avelox and Nasonex - moxifloxacin (AVELOX) 400 MG tablet; Take 1 tablet (400 mg total) by mouth daily.  Dispense: 7 tablet; Refill: 0 - mometasone (NASONEX) 50 MCG/ACT nasal spray; Place 2 sprays into the nose daily.  Dispense: 17 g; Refill: 0  3. Subacute maxillary sinusitis  - moxifloxacin (AVELOX) 400 MG tablet; Take 1 tablet (400 mg total) by mouth daily.  Dispense: 7 tablet; Refill: 0  4. Cough  - benzonatate (TESSALON) 200 MG capsule; Take 1 capsule (200 mg total) by mouth 2 (two) times daily as needed for cough.  Dispense: 20 capsule; Refill: 0 - DG Chest 2 View; Future  5. Essential hypertension Blood pressure is elevated likely because of the acute infection and symptoms of depression. Recheck in 6 weeks.   Jonathon Snow [name] [facility] 02/02/2018 9:41 AM

## 2018-02-02 NOTE — Telephone Encounter (Signed)
Was schedule for the patient to come in.

## 2018-02-09 ENCOUNTER — Emergency Department
Admission: EM | Admit: 2018-02-09 | Discharge: 2018-02-09 | Disposition: A | Discharge status: Home / Self Care | Payer: 59 | Attending: Emergency Medicine | Admitting: Emergency Medicine

## 2018-02-09 ENCOUNTER — Encounter: Payer: Self-pay | Admitting: Emergency Medicine

## 2018-02-09 ENCOUNTER — Other Ambulatory Visit: Payer: Self-pay

## 2018-02-09 DIAGNOSIS — M79605 Pain in left leg: Secondary | ICD-10-CM | POA: Insufficient documentation

## 2018-02-09 DIAGNOSIS — Z5321 Procedure and treatment not carried out due to patient leaving prior to being seen by health care provider: Secondary | ICD-10-CM | POA: Diagnosis not present

## 2018-02-09 DIAGNOSIS — R2242 Localized swelling, mass and lump, left lower limb: Secondary | ICD-10-CM | POA: Diagnosis not present

## 2018-02-09 NOTE — ED Notes (Addendum)
Patient ambulatory to First Nurse desk stating that he wants to leave.  Made patient aware that [facility] would be opening soon and he states he will wait. Patient advised that an [place] has been requested.

## 2018-02-09 NOTE — ED Notes (Signed)
First Nurse Note:  Patient walked up to First Nurse desk and stated - "I just need to know something".  Told patient that he was waiting for [place], and he replied "okay, see ya'll" and walked out of waiting room door.

## 2018-02-09 NOTE — ED Notes (Signed)
First Nurse Note:  Patient brought to ED from [facility].  Sent over by Dr. Sherilyn Cooter for evaluation for DVT in left leg.  States he has been off his Eloquis X 1 week.

## 2018-02-09 NOTE — ED Triage Notes (Signed)
Pt to ed with c/o left leg swelling, redness and pain x 7 days.  Swelling is noted to left lower leg above ankle and redness radiates up into leg.  Pt hx of blood clots and was on eliquis, but stopped taking it about 2 weeks ago.

## 2018-02-10 DIAGNOSIS — I8312 Varicose 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: Secondary | ICD-10-CM | POA: Diagnosis not present

## 2018-02-10 DIAGNOSIS — B078 Other viral warts: Secondary | ICD-10-CM | POA: Diagnosis not present

## 2018-02-10 DIAGNOSIS — M793 Panniculitis, unspecified: Secondary | ICD-10-CM | POA: Diagnosis not present

## 2018-02-10 DIAGNOSIS — R21 Rash and other nonspecific skin eruption: Secondary | ICD-10-CM | POA: Diagnosis not present

## 2018-02-10 DIAGNOSIS — R208 Other disturbances of skin sensation: Secondary | ICD-10-CM | POA: Diagnosis not present

## 2018-02-11 ENCOUNTER — Telehealth: Payer: Self-pay | Admitting: Internal Medicine

## 2018-02-11 NOTE — Telephone Encounter (Signed)
Patient has been dismissed from the cancer center.

## 2018-02-15 ENCOUNTER — Telehealth: Payer: Self-pay | Admitting: Emergency Medicine

## 2018-02-15 NOTE — Telephone Encounter (Signed)
Called patient due to lwot to inquire about condition and follow up plans. Left message.   

## 2018-02-21 ENCOUNTER — Inpatient Hospital Stay: Payer: 59

## 2018-02-21 ENCOUNTER — Emergency Department: Payer: 59

## 2018-02-21 ENCOUNTER — Inpatient Hospital Stay
Admission: EM | Admit: 2018-02-21 | Discharge: 2018-03-03 | DRG: 871 | Disposition: A | Discharge status: Home Health | Payer: 59 | Attending: Internal Medicine | Admitting: Internal Medicine

## 2018-02-21 ENCOUNTER — Encounter: Payer: Self-pay | Admitting: Emergency Medicine

## 2018-02-21 DIAGNOSIS — I129 Hypertensive chronic kidney disease with stage 1 through stage 4 chronic kidney disease, or unspecified chronic kidney disease: Secondary | ICD-10-CM | POA: Diagnosis present

## 2018-02-21 DIAGNOSIS — N170 Acute kidney failure with tubular necrosis: Secondary | ICD-10-CM | POA: Diagnosis not present

## 2018-02-21 DIAGNOSIS — Z4682 Encounter for fitting and adjustment of non-vascular catheter: Secondary | ICD-10-CM | POA: Diagnosis not present

## 2018-02-21 DIAGNOSIS — J690 Pneumonitis due to inhalation of food and vomit: Secondary | ICD-10-CM | POA: Diagnosis present

## 2018-02-21 DIAGNOSIS — T380X5A Adverse effect of glucocorticoids and synthetic analogues, initial encounter: Secondary | ICD-10-CM | POA: Diagnosis not present

## 2018-02-21 DIAGNOSIS — I959 Hypotension, unspecified: Secondary | ICD-10-CM

## 2018-02-21 DIAGNOSIS — J449 Chronic obstructive pulmonary disease, unspecified: Secondary | ICD-10-CM | POA: Diagnosis present

## 2018-02-21 DIAGNOSIS — M3214 Glomerular disease in systemic lupus erythematosus: Secondary | ICD-10-CM | POA: Diagnosis present

## 2018-02-21 DIAGNOSIS — K219 Gastro-esophageal reflux disease without esophagitis: Secondary | ICD-10-CM | POA: Diagnosis present

## 2018-02-21 DIAGNOSIS — R197 Diarrhea, unspecified: Secondary | ICD-10-CM | POA: Diagnosis not present

## 2018-02-21 DIAGNOSIS — Z7901 Long term (current) use of anticoagulants: Secondary | ICD-10-CM | POA: Diagnosis not present

## 2018-02-21 DIAGNOSIS — J189 Pneumonia, unspecified organism: Secondary | ICD-10-CM

## 2018-02-21 DIAGNOSIS — N179 Acute kidney failure, unspecified: Secondary | ICD-10-CM

## 2018-02-21 DIAGNOSIS — G9341 Metabolic encephalopathy: Secondary | ICD-10-CM | POA: Diagnosis present

## 2018-02-21 DIAGNOSIS — R059 Cough, unspecified: Secondary | ICD-10-CM

## 2018-02-21 DIAGNOSIS — R1032 Left lower quadrant pain: Secondary | ICD-10-CM | POA: Diagnosis not present

## 2018-02-21 DIAGNOSIS — R809 Proteinuria, unspecified: Secondary | ICD-10-CM | POA: Diagnosis not present

## 2018-02-21 DIAGNOSIS — R451 Restlessness and agitation: Secondary | ICD-10-CM | POA: Diagnosis not present

## 2018-02-21 DIAGNOSIS — Z86711 Personal history of pulmonary embolism: Secondary | ICD-10-CM

## 2018-02-21 DIAGNOSIS — J9690 Respiratory failure, unspecified, unspecified whether with hypoxia or hypercapnia: Secondary | ICD-10-CM | POA: Diagnosis present

## 2018-02-21 DIAGNOSIS — R531 Weakness: Secondary | ICD-10-CM | POA: Diagnosis present

## 2018-02-21 DIAGNOSIS — J9601 Acute respiratory failure with hypoxia: Secondary | ICD-10-CM | POA: Diagnosis present

## 2018-02-21 DIAGNOSIS — E785 Hyperlipidemia, unspecified: Secondary | ICD-10-CM | POA: Diagnosis present

## 2018-02-21 DIAGNOSIS — Z95828 Presence of other vascular implants and grafts: Secondary | ICD-10-CM

## 2018-02-21 DIAGNOSIS — T501X5A Adverse effect of loop [high-ceiling] diuretics, initial encounter: Secondary | ICD-10-CM | POA: Diagnosis not present

## 2018-02-21 DIAGNOSIS — R410 Disorientation, unspecified: Secondary | ICD-10-CM

## 2018-02-21 DIAGNOSIS — R0602 Shortness of breath: Secondary | ICD-10-CM

## 2018-02-21 DIAGNOSIS — A419 Sepsis, unspecified organism: Principal | ICD-10-CM | POA: Diagnosis present

## 2018-02-21 DIAGNOSIS — T50904A Poisoning by unspecified drugs, medicaments and biological substances, undetermined, initial encounter: Secondary | ICD-10-CM | POA: Diagnosis present

## 2018-02-21 DIAGNOSIS — E876 Hypokalemia: Secondary | ICD-10-CM | POA: Diagnosis not present

## 2018-02-21 DIAGNOSIS — Z86718 Personal history of other venous thrombosis and embolism: Secondary | ICD-10-CM | POA: Diagnosis not present

## 2018-02-21 DIAGNOSIS — R34 Anuria and oliguria: Secondary | ICD-10-CM | POA: Diagnosis present

## 2018-02-21 DIAGNOSIS — R6521 Severe sepsis with septic shock: Secondary | ICD-10-CM | POA: Diagnosis present

## 2018-02-21 DIAGNOSIS — F1110 Opioid abuse, uncomplicated: Secondary | ICD-10-CM

## 2018-02-21 DIAGNOSIS — R319 Hematuria, unspecified: Secondary | ICD-10-CM | POA: Diagnosis not present

## 2018-02-21 DIAGNOSIS — N189 Chronic kidney disease, unspecified: Secondary | ICD-10-CM

## 2018-02-21 DIAGNOSIS — J9600 Acute respiratory failure, unspecified whether with hypoxia or hypercapnia: Secondary | ICD-10-CM | POA: Diagnosis not present

## 2018-02-21 DIAGNOSIS — Z885 Allergy status to narcotic agent status: Secondary | ICD-10-CM

## 2018-02-21 DIAGNOSIS — R001 Bradycardia, unspecified: Secondary | ICD-10-CM | POA: Diagnosis not present

## 2018-02-21 DIAGNOSIS — N183 Chronic kidney disease, stage 3 (moderate): Secondary | ICD-10-CM | POA: Diagnosis present

## 2018-02-21 DIAGNOSIS — R05 Cough: Secondary | ICD-10-CM

## 2018-02-21 DIAGNOSIS — Z9119 Patient's noncompliance with other medical treatment and regimen: Secondary | ICD-10-CM | POA: Diagnosis not present

## 2018-02-21 DIAGNOSIS — Z452 Encounter for adjustment and management of vascular access device: Secondary | ICD-10-CM | POA: Diagnosis not present

## 2018-02-21 DIAGNOSIS — K5900 Constipation, unspecified: Secondary | ICD-10-CM | POA: Diagnosis not present

## 2018-02-21 DIAGNOSIS — R4020 Unspecified coma: Secondary | ICD-10-CM | POA: Diagnosis not present

## 2018-02-21 DIAGNOSIS — T466X4A Poisoning by antihyperlipidemic and antiarteriosclerotic drugs, undetermined, initial encounter: Secondary | ICD-10-CM | POA: Diagnosis not present

## 2018-02-21 DIAGNOSIS — Z79899 Other long term (current) drug therapy: Secondary | ICD-10-CM

## 2018-02-21 HISTORY — DX: Depression, unspecified: F32.A

## 2018-02-21 HISTORY — DX: Gastro-esophageal reflux disease without esophagitis: K21.9

## 2018-02-21 HISTORY — DX: Major depressive disorder, single episode, unspecified: F32.9

## 2018-02-21 LAB — BASIC METABOLIC PANEL
ANION GAP: 11 (ref 5–15)
BUN: 38 mg/dL — ABNORMAL HIGH (ref 6–20)
CALCIUM: 7.8 mg/dL — AB (ref 8.9–10.3)
CHLORIDE: 111 mmol/L (ref 101–111)
CO2: 17 mmol/L — AB (ref 22–32)
Creatinine, Ser: 2.65 mg/dL — ABNORMAL HIGH (ref 0.61–1.24)
GFR calc non Af Amer: 25 mL/min — ABNORMAL LOW (ref >60)
GFR, EST AFRICAN AMERICAN: 29 mL/min — AB (ref >60)
Glucose, Bld: 142 mg/dL — ABNORMAL HIGH (ref 65–99)
POTASSIUM: 5.6 mmol/L — AB (ref 3.5–5.1)
Sodium: 139 mmol/L (ref 135–145)

## 2018-02-21 LAB — RENAL FUNCTION PANEL
ALBUMIN: 3 g/dL — AB (ref 3.5–5.0)
ANION GAP: 5 (ref 5–15)
Albumin: 2.8 g/dL — ABNORMAL LOW (ref 3.5–5.0)
Anion gap: 10 (ref 5–15)
BUN: 37 mg/dL — ABNORMAL HIGH (ref 6–20)
BUN: 34 mg/dL — ABNORMAL HIGH (ref 6–20)
CALCIUM: 7.5 mg/dL — AB (ref 8.9–10.3)
CHLORIDE: 107 mmol/L (ref 101–111)
CO2: 21 mmol/L — ABNORMAL LOW (ref 22–32)
CO2: 23 mmol/L (ref 22–32)
CREATININE: 2.2 mg/dL — AB (ref 0.61–1.24)
Calcium: 7.9 mg/dL — ABNORMAL LOW (ref 8.9–10.3)
Chloride: 112 mmol/L — ABNORMAL HIGH (ref 101–111)
Creatinine, Ser: 2.69 mg/dL — ABNORMAL HIGH (ref 0.61–1.24)
GFR, EST AFRICAN AMERICAN: 29 mL/min — AB (ref >60)
GFR, EST AFRICAN AMERICAN: 37 mL/min — AB (ref >60)
GFR, EST NON AFRICAN AMERICAN: 25 mL/min — AB (ref >60)
GFR, EST NON AFRICAN AMERICAN: 32 mL/min — AB (ref >60)
GLUCOSE: 143 mg/dL — AB (ref 65–99)
Glucose, Bld: 208 mg/dL — ABNORMAL HIGH (ref 65–99)
PHOSPHORUS: 4 mg/dL (ref 2.5–4.6)
POTASSIUM: 5.6 mmol/L — AB (ref 3.5–5.1)
Phosphorus: 3.8 mg/dL (ref 2.5–4.6)
Potassium: 5.4 mmol/L — ABNORMAL HIGH (ref 3.5–5.1)
SODIUM: 138 mmol/L (ref 135–145)
SODIUM: 140 mmol/L (ref 135–145)

## 2018-02-21 LAB — BLOOD GAS, ARTERIAL
ACID-BASE DEFICIT: 8.1 mmol/L — AB (ref 0.0–2.0)
ACID-BASE DEFICIT: 10 mmol/L — AB (ref 0.0–2.0)
Acid-base deficit: 8.8 mmol/L — ABNORMAL HIGH (ref 0.0–2.0)
Acid-base deficit: 7.3 mmol/L — ABNORMAL HIGH (ref 0.0–2.0)
Acid-base deficit: 2.7 mmol/L — ABNORMAL HIGH (ref 0.0–2.0)
BICARBONATE: 18.3 mmol/L — AB (ref 20.0–28.0)
Bicarbonate: 21.3 mmol/L (ref 20.0–28.0)
Bicarbonate: 20 mmol/L (ref 20.0–28.0)
Bicarbonate: 22.3 mmol/L (ref 20.0–28.0)
Bicarbonate: 24.5 mmol/L (ref 20.0–28.0)
FIO2: 1
FIO2: 1
FIO2: 1
FIO2: 0.8
FIO2: 0.8
MECHANICAL RATE: 18
MECHVT: 500 mL
MECHVT: 500 mL
Mechanical Rate: 24
O2 SAT: 91.6 %
O2 SAT: 95.9 %
O2 Saturation: 92.8 %
O2 Saturation: 84.6 %
O2 Saturation: 90.4 %
PATIENT TEMPERATURE: 37
PATIENT TEMPERATURE: 37
PATIENT TEMPERATURE: 37
PCO2 ART: 50 mmHg — AB (ref 32.0–48.0)
PEEP/CPAP: 8 cmH2O
PEEP: 5 cmH2O
PEEP: 5 cmH2O
PEEP: 8 cmH2O
PEEP: 8 cmH2O
PH ART: 7.21 — AB (ref 7.350–7.450)
PH ART: 7.19 — AB (ref 7.350–7.450)
PO2 ART: 88 mmHg (ref 83.0–108.0)
PO2 ART: 76 mmHg — AB (ref 83.0–108.0)
PO2 ART: 63 mmHg — AB (ref 83.0–108.0)
PO2 ART: 90 mmHg (ref 83.0–108.0)
Patient temperature: 37
Patient temperature: 37
RATE: 20 resp/min
RATE: 24 resp/min
RATE: 24 resp/min
VT: 500 mL
VT: 500 mL
VT: 500 mL
pCO2 arterial: 67 mmHg (ref 32.0–48.0)
pCO2 arterial: 61 mmHg — ABNORMAL HIGH (ref 32.0–48.0)
pCO2 arterial: 48 mmHg (ref 32.0–48.0)
pCO2 arterial: 51 mmHg — ABNORMAL HIGH (ref 32.0–48.0)
pH, Arterial: 7.11 — CL (ref 7.350–7.450)
pH, Arterial: 7.17 — CL (ref 7.350–7.450)
pH, Arterial: 7.29 — ABNORMAL LOW (ref 7.350–7.450)
pO2, Arterial: 74 mmHg — ABNORMAL LOW (ref 83.0–108.0)

## 2018-02-21 LAB — LIPASE, BLOOD: LIPASE: 56 U/L — AB (ref 11–51)

## 2018-02-21 LAB — COMPREHENSIVE METABOLIC PANEL
ALBUMIN: 3.4 g/dL — AB (ref 3.5–5.0)
ALK PHOS: 79 U/L (ref 38–126)
ALT: 18 U/L (ref 17–63)
ALT: 21 U/L (ref 17–63)
AST: 30 U/L (ref 15–41)
AST: 29 U/L (ref 15–41)
Albumin: 3.2 g/dL — ABNORMAL LOW (ref 3.5–5.0)
Alkaline Phosphatase: 81 U/L (ref 38–126)
Anion gap: 10 (ref 5–15)
Anion gap: 10 (ref 5–15)
BUN: 31 mg/dL — ABNORMAL HIGH (ref 6–20)
BUN: 31 mg/dL — AB (ref 6–20)
CHLORIDE: 105 mmol/L (ref 101–111)
CHLORIDE: 110 mmol/L (ref 101–111)
CO2: 24 mmol/L (ref 22–32)
CO2: 20 mmol/L — AB (ref 22–32)
CREATININE: 2.14 mg/dL — AB (ref 0.61–1.24)
Calcium: 8.4 mg/dL — ABNORMAL LOW (ref 8.9–10.3)
Calcium: 7.8 mg/dL — ABNORMAL LOW (ref 8.9–10.3)
Creatinine, Ser: 2.51 mg/dL — ABNORMAL HIGH (ref 0.61–1.24)
GFR calc non Af Amer: 27 mL/min — ABNORMAL LOW (ref >60)
GFR, EST AFRICAN AMERICAN: 31 mL/min — AB (ref >60)
GFR, EST AFRICAN AMERICAN: 38 mL/min — AB (ref >60)
GFR, EST NON AFRICAN AMERICAN: 33 mL/min — AB (ref >60)
GLUCOSE: 310 mg/dL — AB (ref 65–99)
Glucose, Bld: 147 mg/dL — ABNORMAL HIGH (ref 65–99)
POTASSIUM: 4.7 mmol/L (ref 3.5–5.1)
POTASSIUM: 4.2 mmol/L (ref 3.5–5.1)
SODIUM: 139 mmol/L (ref 135–145)
SODIUM: 140 mmol/L (ref 135–145)
Total Bilirubin: 0.6 mg/dL (ref 0.3–1.2)
Total Bilirubin: 0.4 mg/dL (ref 0.3–1.2)
Total Protein: 6.4 g/dL — ABNORMAL LOW (ref 6.5–8.1)
Total Protein: 6.4 g/dL — ABNORMAL LOW (ref 6.5–8.1)

## 2018-02-21 LAB — CBC WITH DIFFERENTIAL/PLATELET
BASOS ABS: 0.1 10*3/uL (ref 0–0.1)
BASOS PCT: 1 %
EOS ABS: 0.1 10*3/uL (ref 0–0.7)
EOS PCT: 1 %
HEMATOCRIT: 42.8 % (ref 40.0–52.0)
Hemoglobin: 13.5 g/dL (ref 13.0–18.0)
Lymphocytes Relative: 18 %
Lymphs Abs: 1.6 10*3/uL (ref 1.0–3.6)
MCH: 29 pg (ref 26.0–34.0)
MCHC: 31.4 g/dL — AB (ref 32.0–36.0)
MCV: 92.2 fL (ref 80.0–100.0)
MONO ABS: 0.2 10*3/uL (ref 0.2–1.0)
MONOS PCT: 2 %
NEUTROS ABS: 6.7 10*3/uL — AB (ref 1.4–6.5)
Neutrophils Relative %: 78 %
PLATELETS: 193 10*3/uL (ref 150–440)
RBC: 4.64 MIL/uL (ref 4.40–5.90)
RDW: 15.3 % — AB (ref 11.5–14.5)
WBC: 8.6 10*3/uL (ref 3.8–10.6)

## 2018-02-21 LAB — GLUCOSE, CAPILLARY
GLUCOSE-CAPILLARY: 118 mg/dL — AB (ref 65–99)
Glucose-Capillary: 165 mg/dL — ABNORMAL HIGH (ref 65–99)
Glucose-Capillary: 96 mg/dL (ref 65–99)
Glucose-Capillary: 111 mg/dL — ABNORMAL HIGH (ref 65–99)

## 2018-02-21 LAB — MAGNESIUM
MAGNESIUM: 1.6 mg/dL — AB (ref 1.7–2.4)
Magnesium: 1.7 mg/dL (ref 1.7–2.4)

## 2018-02-21 LAB — URINE DRUG SCREEN, QUALITATIVE (ARMC ONLY)
Amphetamines, Ur Screen: NOT DETECTED
Barbiturates, Ur Screen: NOT DETECTED
Benzodiazepine, Ur Scrn: NOT DETECTED
CANNABINOID 50 NG, UR ~~LOC~~: NOT DETECTED
COCAINE METABOLITE, UR ~~LOC~~: NOT DETECTED
MDMA (ECSTASY) UR SCREEN: NOT DETECTED
Methadone Scn, Ur: NOT DETECTED
OPIATE, UR SCREEN: POSITIVE — AB
PHENCYCLIDINE (PCP) UR S: NOT DETECTED
Tricyclic, Ur Screen: NOT DETECTED

## 2018-02-21 LAB — MRSA PCR SCREENING: MRSA BY PCR: NEGATIVE

## 2018-02-21 LAB — ETHANOL

## 2018-02-21 LAB — URINALYSIS, COMPLETE (UACMP) WITH MICROSCOPIC
BILIRUBIN URINE: NEGATIVE
GLUCOSE, UA: NEGATIVE mg/dL
Ketones, ur: NEGATIVE mg/dL
LEUKOCYTES UA: NEGATIVE
NITRITE: NEGATIVE
Protein, ur: 100 mg/dL — AB
SPECIFIC GRAVITY, URINE: 1.014 (ref 1.005–1.030)
pH: 5 (ref 5.0–8.0)

## 2018-02-21 LAB — CBC
HEMATOCRIT: 46.2 % (ref 40.0–52.0)
Hemoglobin: 14.6 g/dL (ref 13.0–18.0)
MCH: 29 pg (ref 26.0–34.0)
MCHC: 31.6 g/dL — ABNORMAL LOW (ref 32.0–36.0)
MCV: 91.8 fL (ref 80.0–100.0)
PLATELETS: 153 10*3/uL (ref 150–440)
RBC: 5.03 MIL/uL (ref 4.40–5.90)
RDW: 15.5 % — ABNORMAL HIGH (ref 11.5–14.5)
WBC: 3.9 10*3/uL (ref 3.8–10.6)

## 2018-02-21 LAB — LACTIC ACID, PLASMA: LACTIC ACID, VENOUS: 0.4 mmol/L — AB (ref 0.5–1.9)

## 2018-02-21 LAB — APTT

## 2018-02-21 LAB — BRAIN NATRIURETIC PEPTIDE: B NATRIURETIC PEPTIDE 5: 24 pg/mL (ref 0.0–100.0)

## 2018-02-21 LAB — PROTIME-INR
INR: 0.98
Prothrombin Time: 12.9 seconds (ref 11.4–15.2)

## 2018-02-21 LAB — HEPARIN LEVEL (UNFRACTIONATED): HEPARIN UNFRACTIONATED: 1.27 [IU]/mL — AB (ref 0.30–0.70)

## 2018-02-21 LAB — TROPONIN I
TROPONIN I: 0.07 ng/mL — AB (ref <0.03)
Troponin I: 0.03 ng/mL (ref <0.03)
Troponin I: 0.06 ng/mL (ref <0.03)

## 2018-02-21 LAB — ACETAMINOPHEN LEVEL: Acetaminophen (Tylenol), Serum: <10 ug/mL — ABNORMAL LOW (ref 10–30)

## 2018-02-21 LAB — SALICYLATE LEVEL

## 2018-02-21 MED ORDER — METHYLPREDNISOLONE SODIUM SUCC 40 MG IJ SOLR
40.0000 mg | INTRAMUSCULAR | Status: DC
  Administered 2018-02-21 – 2018-02-22 (×7): 40 mg via INTRAVENOUS
  Filled 2018-02-21 (×7): qty 1

## 2018-02-21 MED ORDER — SODIUM BICARBONATE 8.4 % IV SOLN
INTRAVENOUS | Status: DC
  Administered 2018-02-21 – 2018-02-23 (×4): via INTRAVENOUS
  Filled 2018-02-21 (×7): qty 150

## 2018-02-21 MED ORDER — ADULT MULTIVITAMIN LIQUID CH
15.0000 mL | Freq: Every day | ORAL | Status: DC
  Administered 2018-02-21 – 2018-02-23 (×3): 15 mL
  Filled 2018-02-21 (×4): qty 15

## 2018-02-21 MED ORDER — FENTANYL CITRATE (PF) 100 MCG/2ML IJ SOLN
25.0000 ug | INTRAMUSCULAR | Status: DC | PRN
  Administered 2018-02-21: 25 ug via INTRAVENOUS

## 2018-02-21 MED ORDER — SODIUM CHLORIDE 0.9 % IV SOLN
0.0000 ug/min | INTRAVENOUS | Status: DC
  Filled 2018-02-21: qty 1

## 2018-02-21 MED ORDER — SODIUM CHLORIDE 0.9 % IV BOLUS (SEPSIS)
1000.0000 mL | Freq: Once | INTRAVENOUS | Status: AC
  Administered 2018-02-21: 1000 mL via INTRAVENOUS

## 2018-02-21 MED ORDER — HYDRALAZINE HCL 20 MG/ML IJ SOLN
5.0000 mg | INTRAMUSCULAR | Status: DC | PRN
  Administered 2018-02-24 – 2018-02-25 (×2): 5 mg via INTRAVENOUS
  Filled 2018-02-21 (×2): qty 1

## 2018-02-21 MED ORDER — KETAMINE HCL 10 MG/ML IJ SOLN
200.0000 mg | Freq: Once | INTRAMUSCULAR | Status: AC
  Administered 2018-02-21: 200 mg via INTRAVENOUS

## 2018-02-21 MED ORDER — ACETAMINOPHEN 650 MG RE SUPP: 650.0000 mg | Freq: Four times a day (QID) | RECTAL | Status: DC | PRN

## 2018-02-21 MED ORDER — VECURONIUM BROMIDE 10 MG IV SOLR: 10.0000 mg | Freq: Once | INTRAVENOUS | Status: DC

## 2018-02-21 MED ORDER — HEPARIN (PORCINE) IN NACL 100-0.45 UNIT/ML-% IJ SOLN
1600.0000 [IU]/h | INTRAMUSCULAR | Status: DC
  Administered 2018-02-21: 1600 [IU]/h via INTRAVENOUS
  Filled 2018-02-21: qty 250

## 2018-02-21 MED ORDER — VANCOMYCIN HCL 10 G IV SOLR
1500.0000 mg | INTRAVENOUS | Status: DC
  Administered 2018-02-21: 1500 mg via INTRAVENOUS
  Filled 2018-02-21 (×2): qty 1500

## 2018-02-21 MED ORDER — SODIUM CHLORIDE 0.9 % IV BOLUS (SEPSIS)
1000.0000 mL | INTRAVENOUS | Status: AC
  Administered 2018-02-21: 1000 mL via INTRAVENOUS

## 2018-02-21 MED ORDER — SODIUM BICARBONATE 8.4 % IV SOLN
150.0000 meq | Freq: Once | INTRAVENOUS | Status: AC
  Administered 2018-02-21: 150 meq via INTRAVENOUS
  Filled 2018-02-21: qty 150

## 2018-02-21 MED ORDER — DEXTROSE 5 % IV SOLN
0.0000 ug/min | INTRAVENOUS | Status: DC
  Administered 2018-02-21: 10 ug/min via INTRAVENOUS
  Filled 2018-02-21: qty 4

## 2018-02-21 MED ORDER — FENTANYL BOLUS VIA INFUSION
50.0000 ug | INTRAVENOUS | Status: DC | PRN
  Administered 2018-02-21 – 2018-02-24 (×8): 50 ug via INTRAVENOUS
  Filled 2018-02-21: qty 50

## 2018-02-21 MED ORDER — DOPAMINE-DEXTROSE 3.2-5 MG/ML-% IV SOLN
INTRAVENOUS | Status: AC
  Administered 2018-02-21: 5 ug/kg/min via INTRAVENOUS
  Filled 2018-02-21: qty 250

## 2018-02-21 MED ORDER — HEPARIN (PORCINE) IN NACL 100-0.45 UNIT/ML-% IJ SOLN
1300.0000 [IU]/h | INTRAMUSCULAR | Status: DC
  Administered 2018-02-21 – 2018-02-22 (×2): 1300 [IU]/h via INTRAVENOUS
  Filled 2018-02-21: qty 250

## 2018-02-21 MED ORDER — ACETAMINOPHEN 325 MG PO TABS: 650.0000 mg | ORAL_TABLET | Freq: Four times a day (QID) | ORAL | Status: DC | PRN

## 2018-02-21 MED ORDER — VANCOMYCIN HCL 10 G IV SOLR
1250.0000 mg | INTRAVENOUS | Status: DC
  Filled 2018-02-21: qty 1250

## 2018-02-21 MED ORDER — FENTANYL CITRATE (PF) 100 MCG/2ML IJ SOLN
INTRAMUSCULAR | Status: AC
  Administered 2018-02-21: 25 ug via INTRAVENOUS
  Filled 2018-02-21: qty 2

## 2018-02-21 MED ORDER — FUROSEMIDE 10 MG/ML IJ SOLN
80.0000 mg | Freq: Once | INTRAMUSCULAR | Status: AC
  Administered 2018-02-21: 80 mg via INTRAVENOUS
  Filled 2018-02-21: qty 8

## 2018-02-21 MED ORDER — SODIUM CHLORIDE 0.9 % IV SOLN
INTRAVENOUS | Status: DC
  Administered 2018-02-21: 06:00:00 via INTRAVENOUS

## 2018-02-21 MED ORDER — FENTANYL 2500MCG IN NS 250ML (10MCG/ML) PREMIX INFUSION
0.0000 ug/h | INTRAVENOUS | Status: DC
Start: 2018-02-21 — End: 2018-02-24
  Administered 2018-02-21: 50 ug/h via INTRAVENOUS
  Administered 2018-02-21: 250 ug/h via INTRAVENOUS
  Administered 2018-02-22 (×2): 300 ug/h via INTRAVENOUS
  Administered 2018-02-23 (×2): 400 ug/h via INTRAVENOUS
  Administered 2018-02-23 (×2): 300 ug/h via INTRAVENOUS
  Administered 2018-02-24: 400 ug/h via INTRAVENOUS
  Filled 2018-02-21 (×8): qty 250

## 2018-02-21 MED ORDER — PRO-STAT SUGAR FREE PO LIQD
60.0000 mL | Freq: Three times a day (TID) | ORAL | Status: DC
  Administered 2018-02-21 – 2018-02-23 (×7): 60 mL

## 2018-02-21 MED ORDER — ROCURONIUM BROMIDE 50 MG/5ML IV SOLN
100.0000 mg | Freq: Once | INTRAVENOUS | Status: AC
  Administered 2018-02-21: 100 mg via INTRAVENOUS
  Filled 2018-02-21: qty 10

## 2018-02-21 MED ORDER — FENTANYL 2500MCG IN NS 250ML (10MCG/ML) PREMIX INFUSION
INTRAVENOUS | Status: AC
  Filled 2018-02-21: qty 250

## 2018-02-21 MED ORDER — SERTRALINE HCL 50 MG PO TABS
50.0000 mg | ORAL_TABLET | Freq: Every day | ORAL | Status: DC
  Administered 2018-02-22 – 2018-03-03 (×10): 50 mg via ORAL
  Filled 2018-02-21 (×10): qty 1

## 2018-02-21 MED ORDER — PIPERACILLIN-TAZOBACTAM 3.375 G IVPB
3.3750 g | Freq: Three times a day (TID) | INTRAVENOUS | Status: DC
  Administered 2018-02-21 (×2): 3.375 g via INTRAVENOUS
  Filled 2018-02-21 (×2): qty 50

## 2018-02-21 MED ORDER — MIDAZOLAM HCL 2 MG/2ML IJ SOLN
2.0000 mg | INTRAMUSCULAR | Status: DC | PRN
  Administered 2018-02-21 – 2018-02-23 (×9): 2 mg via INTRAVENOUS
  Administered 2018-02-24: 1 mg via INTRAVENOUS
  Administered 2018-02-24: 2 mg via INTRAVENOUS
  Filled 2018-02-21 (×12): qty 2

## 2018-02-21 MED ORDER — ONDANSETRON HCL 4 MG/2ML IJ SOLN
4.0000 mg | Freq: Four times a day (QID) | INTRAMUSCULAR | Status: DC | PRN
  Administered 2018-02-21 – 2018-02-28 (×4): 4 mg via INTRAVENOUS
  Filled 2018-02-21 (×4): qty 2

## 2018-02-21 MED ORDER — CHLORHEXIDINE GLUCONATE 0.12% ORAL RINSE (MEDLINE KIT)
15.0000 mL | Freq: Two times a day (BID) | OROMUCOSAL | Status: DC
  Administered 2018-02-21 – 2018-02-24 (×6): 15 mL via OROMUCOSAL

## 2018-02-21 MED ORDER — NOREPINEPHRINE BITARTRATE 1 MG/ML IV SOLN
0.0000 ug/min | INTRAVENOUS | Status: DC
  Administered 2018-02-21: 20 ug/min via INTRAVENOUS
  Administered 2018-02-22: 12 ug/min via INTRAVENOUS
  Administered 2018-02-23: 5 ug/min via INTRAVENOUS
  Administered 2018-02-23: 7 ug/min via INTRAVENOUS
  Filled 2018-02-21 (×4): qty 16

## 2018-02-21 MED ORDER — NALOXONE HCL 2 MG/2ML IJ SOSY
2.0000 mg | PREFILLED_SYRINGE | INTRAMUSCULAR | Status: AC
  Administered 2018-02-21: 2 mg via INTRAVENOUS

## 2018-02-21 MED ORDER — HEPARIN SODIUM (PORCINE) 1000 UNIT/ML DIALYSIS
1000.0000 [IU] | INTRAMUSCULAR | Status: DC | PRN
  Administered 2018-02-22: 3400 [IU] via INTRAVENOUS_CENTRAL
  Filled 2018-02-21 (×2): qty 6
  Filled 2018-02-21: qty 4

## 2018-02-21 MED ORDER — FUROSEMIDE 10 MG/ML IJ SOLN
40.0000 mg | Freq: Two times a day (BID) | INTRAMUSCULAR | Status: DC
  Administered 2018-02-21 – 2018-02-25 (×8): 40 mg via INTRAVENOUS
  Filled 2018-02-21 (×8): qty 4

## 2018-02-21 MED ORDER — VECURONIUM BROMIDE 10 MG IV SOLR
INTRAVENOUS | Status: AC
  Administered 2018-02-21: 10 mg
  Filled 2018-02-21: qty 10

## 2018-02-21 MED ORDER — ORAL CARE MOUTH RINSE
15.0000 mL | OROMUCOSAL | Status: DC
  Administered 2018-02-21 – 2018-02-26 (×50): 15 mL via OROMUCOSAL

## 2018-02-21 MED ORDER — VASOPRESSIN 20 UNIT/ML IV SOLN
0.0300 [IU]/min | INTRAVENOUS | Status: DC
  Administered 2018-02-21: 0.03 [IU]/min via INTRAVENOUS
  Filled 2018-02-21: qty 2

## 2018-02-21 MED ORDER — ONDANSETRON HCL 4 MG PO TABS: 4.0000 mg | ORAL_TABLET | Freq: Four times a day (QID) | ORAL | Status: DC | PRN

## 2018-02-21 MED ORDER — PIPERACILLIN-TAZOBACTAM 3.375 G IVPB
3.3750 g | Freq: Four times a day (QID) | INTRAVENOUS | Status: DC
  Administered 2018-02-21 – 2018-02-22 (×2): 3.375 g via INTRAVENOUS
  Filled 2018-02-21 (×8): qty 50

## 2018-02-21 MED ORDER — VANCOMYCIN HCL IN DEXTROSE 1-5 GM/200ML-% IV SOLN
1000.0000 mg | Freq: Once | INTRAVENOUS | Status: AC
  Administered 2018-02-21: 1000 mg via INTRAVENOUS
  Filled 2018-02-21: qty 200

## 2018-02-21 MED ORDER — DOPAMINE-DEXTROSE 3.2-5 MG/ML-% IV SOLN
0.0000 ug/kg/min | Freq: Once | INTRAVENOUS | Status: AC
  Administered 2018-02-21: 5 ug/kg/min via INTRAVENOUS

## 2018-02-21 MED ORDER — HEPARIN BOLUS VIA INFUSION
4000.0000 [IU] | Freq: Once | INTRAVENOUS | Status: AC
  Administered 2018-02-21: 4000 [IU] via INTRAVENOUS
  Filled 2018-02-21: qty 4000

## 2018-02-21 MED ORDER — SODIUM CHLORIDE 0.9 % IV SOLN: 3.0000 g | INTRAVENOUS | Status: DC

## 2018-02-21 MED ORDER — VITAL HIGH PROTEIN PO LIQD
1000.0000 mL | ORAL | Status: DC
  Administered 2018-02-21 – 2018-02-23 (×2): 1000 mL

## 2018-02-21 MED ORDER — DEXMEDETOMIDINE HCL IN NACL 400 MCG/100ML IV SOLN
0.4000 ug/kg/h | INTRAVENOUS | Status: DC
  Administered 2018-02-21: 0.4 ug/kg/h via INTRAVENOUS
  Filled 2018-02-21: qty 100

## 2018-02-21 NOTE — Progress Notes (Signed)
Pt has made some improvement regarding 02 has been decreased from 100% to 80 %  And has mantained sats 92-94% , which is an improvement from 88 this am  Temp has normalized and Bair hugger is off with temp of 98 orally.  Pt has rested well, but needed more and more sedation as becomes easily anxious and hands are up around ET tube, fentyl increased to 200 mcgs per hour. I dose of prn versed given with good effect. When pt is awake he is alert and will follow commands  Negative issues. BP dropped this pm and levophed started and titrated to keep MAP at 65 or above , currently Levophed at 20 mcgs and will quad strength next bag as patient is having lots of IV fluids and no urine output. Urine output was good this am  750 mls, but urine stopped at lunch time and has had very little urine since. Dr Marita Snellen notified and pt to begin on CRRT tonight. Family updated throughout day and express understanding of plan , questions answered as asked

## 2018-02-21 NOTE — Care Management Note (Signed)
Case Management Note  Patient Details  Name: Jonathon Snow MRN: [id number] Date of Birth: 05-May-1961  Subjective/Objective:     Mr Gains CCU nurse called to Lacey Jensen that Mr [place] family wants to file a police report regarding events leading up to this hospitalization. Nurse was advised by this writer to ask one of the police persons in the ED to speak with the family.                Action/Plan:   Expected Discharge Date:                  Expected Discharge Plan:     In-House Referral:     Discharge planning Services     Post Acute Care Choice:    Choice offered to:     DME Arranged:    DME Agency:     HH Arranged:    HH Agency:     Status of Service:     If discussed at [employer] of Stay Meetings, dates discussed:    Additional Comments:  Hazely Sealey A, RN 02/21/2018, 10:58 AM

## 2018-02-21 NOTE — Progress Notes (Signed)
Centra line placed  RIJ and Rt vascath in rt groin for possible dialysis. Family in to see, daughter and son and expressed concern re girlfriend . CM contacted and discussed concerns with social worker, who advised calling [employer]. Family given number and called police and expressed concerns to police officer. A password was established and do not admit list of visitors given to front desk staff.

## 2018-02-21 NOTE — Progress Notes (Signed)
ANTICOAGULATION CONSULT NOTE - Initial Consult  Pharmacy Consult for heparin gtt Indication: pulmonary embolus  Allergies  Allergen Reactions  . Hydrocodone Rash  . Vicodin [Hydrocodone-Acetaminophen] Hives and Rash    Severe headaches (also)    Patient Measurements: Weight: 245 lb 9.5 oz (111.4 kg) Heparin Dosing Weight: 104kg  Vital Signs: Temp: 94.3 F (34.6 C) (03/03 0800) Temp Source: Rectal (03/03 0600) BP: 112/84 (03/03 0900) Pulse Rate: 76 (03/03 0900)  Labs: Recent Labs    02/21/18 0252 02/21/18 0543  HGB 13.5 14.6  HCT 42.8 46.2  PLT 193 153  APTT <24*  --   LABPROT 12.9  --   INR 0.98  --   CREATININE 2.51* 2.14*  TROPONINI <0.03 0.03*    Estimated Creatinine Clearance: 51.2 mL/min (A) (by C-G formula based on SCr of 2.14 mg/dL (H)).   Medical History: Past Medical History:  Diagnosis Date  . Depression   . DVT (deep venous thrombosis) ([place])   . GERD (gastroesophageal reflux disease)   . Hyperlipidemia   . Hypertension   . Lupus   . Pulmonary embolism ([place])   . Renal disorder    Stage III    Medications:  Medications Prior to Admission  Medication Sig Dispense Refill Last Dose  . amLODipine (NORVASC) 5 MG tablet Take 2.5 mg by mouth at bedtime.   Unknown at Unknown  . benzonatate (TESSALON) 200 MG capsule Take 1 capsule (200 mg total) by mouth 2 (two) times daily as needed for cough. 20 capsule 0 prn at prn  . doxycycline (VIBRA-TABS) 100 MG tablet Take 100 mg by mouth 2 (two) times daily.   Unknown at Unknown  . losartan (COZAAR) 100 MG tablet Take 100 mg by mouth daily.  6 Unknown at Unknown  . mometasone (NASONEX) 50 MCG/ACT nasal spray Place 2 sprays into the nose daily. 17 g 0 Unknown at Unknown  . mycophenolate (CELLCEPT) 500 MG tablet Take 1,000 mg by mouth 2 (two) times daily.   Unknown at Unknown  . omeprazole (PRILOSEC OTC) 20 MG tablet Take 20 mg by mouth daily.   Unknown at Unknown  . PARoxetine (PAXIL) 20 MG tablet Take 1  tablet (20 mg total) by mouth daily. 90 tablet 0 Unknown at Unknown  . sertraline (ZOLOFT) 50 MG tablet Take 50 mg by mouth daily.   Unknown at Unknown  . apixaban (ELIQUIS) 5 MG TABS tablet Take 5 mg by mouth once a day (Patient not taking: Reported on 02/21/2018) 5 tablet 0 Completed Course at Unknown time  . diphenhydrAMINE (BENADRYL) 25 mg capsule Take 1 capsule (25 mg total) by mouth every 8 (eight) hours as needed. (Patient not taking: Reported on 10/18/2017) 10 capsule 0 Not Taking  . loperamide (IMODIUM A-D) 2 MG tablet Take 1 tablet (2 mg total) 4 (four) times daily as needed by mouth for diarrhea or loose stools. (Patient not taking: Reported on 12/31/2017) 30 tablet 0 Not Taking  . moxifloxacin (AVELOX) 400 MG tablet Take 1 tablet (400 mg total) by mouth daily. (Patient not taking: Reported on 02/21/2018) 7 tablet 0 Completed Course at Unknown time  . Multiple Vitamins-Minerals (MULTIVITAMIN WITH MINERALS) tablet Take 1 tablet by mouth daily.   Not Taking at Unknown time  . mupirocin ointment (BACTROBAN) 2 % Place 1 application into the nose 2 (two) times daily. (Patient not taking: Reported on 10/18/2017) 22 g 0 Not Taking  . ondansetron (ZOFRAN ODT) 4 MG disintegrating tablet Take 1 tablet (4 mg total)  every 8 (eight) hours as needed by mouth for nausea or vomiting. (Patient not taking: Reported on 12/31/2017) 20 tablet 0 Not Taking at Unknown time  . pantoprazole (PROTONIX) 40 MG tablet TAKE 1 TABLET BY MOUTH EVERY DAY (Patient not taking: Reported on 10/18/2017) 30 tablet 0 Not Taking at Unknown time  . prednisoLONE 5 MG TABS tablet Take 10 mg by mouth daily.   Not Taking at Unknown time  . predniSONE (DELTASONE) 10 MG tablet Take 1 tablet by mouth daily.   Not Taking at Unknown time  . promethazine (PHENERGAN) 50 MG tablet Take 0.5 tablets (25 mg total) by mouth every 6 (six) hours as needed for nausea or vomiting. (Patient not taking: Reported on 10/18/2017) 30 tablet 0 Not Taking at Unknown  time  . rosuvastatin (CRESTOR) 10 MG tablet Take 1 tablet (10 mg total) by mouth at bedtime. (Patient not taking: Reported on 10/18/2017) 90 tablet 0 Not Taking at Unknown time  . traMADol (ULTRAM) 50 MG tablet Take 2 tablets (100 mg total) by mouth every 6 (six) hours as needed for moderate pain. (Patient not taking: Reported on 10/18/2017) 30 tablet 0 Not Taking at Unknown time   Scheduled:  . chlorhexidine gluconate (MEDLINE KIT)  15 mL Mouth Rinse BID  . furosemide  80 mg Intravenous Once  . heparin  4,000 Units Intravenous Once  . mouth rinse  15 mL Mouth Rinse 10 times per day  . methylPREDNISolone (SOLU-MEDROL) injection  40 mg Intravenous Q4H  . sertraline  50 mg Oral Daily  . vecuronium  10 mg Intravenous Once   Infusions:  . sodium chloride 100 mL/hr at 02/21/18 0600  . fentaNYL infusion INTRAVENOUS    . heparin    . piperacillin-tazobactam (ZOSYN)  IV 3.375 g (02/21/18 0605)  . vancomycin     PRN: fentaNYL, hydrALAZINE, midazolam, ondansetron **OR** ondansetron (ZOFRAN) IV Anti-infectives (From admission, onward)   Start     Dose/Rate Route Frequency Ordered Stop   02/21/18 1200  vancomycin (VANCOCIN) 1,250 mg in sodium chloride 0.9 % 250 mL IVPB  Status:  Discontinued     1,250 mg 166.7 mL/hr over 90 Minutes Intravenous Every 24 hours 02/21/18 0617 02/21/18 0624   02/21/18 1200  vancomycin (VANCOCIN) 1,500 mg in sodium chloride 0.9 % 500 mL IVPB     1,500 mg 250 mL/hr over 120 Minutes Intravenous Every 24 hours 02/21/18 0624     02/21/18 0430  piperacillin-tazobactam (ZOSYN) IVPB 3.375 g     3.375 g 12.5 mL/hr over 240 Minutes Intravenous Every 8 hours 02/21/18 0419     02/21/18 0430  vancomycin (VANCOCIN) IVPB 1000 mg/200 mL premix     1,000 mg 200 mL/hr over 60 Minutes Intravenous  Once 02/21/18 0419 02/21/18 0650   02/21/18 0400  Ampicillin-Sulbactam (UNASYN) 3 g in sodium chloride 0.9 % 100 mL IVPB  Status:  Discontinued     3 g 200 mL/hr over 30 Minutes  Intravenous STAT 02/21/18 0350 02/21/18 0517      Assessment: 57 year old male with PE requiring anticoagulation via heparin gtt for PE, per pharmacy protocol.  Goal of Therapy:  Heparin level 0.3-0.7 units/ml Monitor platelets by anticoagulation protocol: Yes   Plan:  Give 4000 units bolus x 1 Start heparin infusion at 1600 units/hr Check anti-Xa level in 6 hours and daily while on heparin Continue to monitor H&H and platelets  Donna Christen Symphani Eckstrom 02/21/2018,10:01 AM

## 2018-02-21 NOTE — Progress Notes (Signed)
Heparin unfractionated came back as 1.27, dicussed  With pharmacy, heparin decreased to 1300 units an hour after being held for 1 hour and repeat lab work in 6 hours.  CRRT is running , having to decrease blood flow to prevent alarms  as line is positional in rt groin.  May need more sedation in attempt to keep pt still for CRRT .

## 2018-02-21 NOTE — ED Notes (Signed)
Patient suctioned, he had fluid in his nostrils bilaterally

## 2018-02-21 NOTE — H&P (Signed)
[facility] NAME: Jonathon Snow    MR#:  [id number]  DATE OF BIRTH:  Apr 02, 1961  DATE OF ADMISSION:  02/21/2018  PRIMARY CARE PHYSICIAN: Arnetha Courser, MD   REQUESTING/REFERRING PHYSICIAN:   CHIEF COMPLAINT:   Chief Complaint  Patient presents with  . Unresponsive    HISTORY OF PRESENT ILLNESS: Jonathon Snow  is a 57 y.o. male with a known history of deep venous thrombosis on Eliquis for anticoagulation, lupus erythematosus, pulmonary embolism, chronic kidney disease stage III, hyperlipidemia, hypertension was brought to the emergency room unresponsive.  The patient was picked up by EMS and they found to be patient in a agonal breathing.  Intraosseous line was put in by the EMS and the left lower extremity.  Patient was lethargic, confused with decreased responsiveness.  He was intubated and put on ventilator in the emergency room.  Patient was started on IV Precedex drip for sedation.  His blood pressure was low when he presented to the emergency room he was given IV fluids and started on IV dopamine drip.  Once blood pressure improved dopamine drip was weaned off.  Chest x-ray revealed aspiration pneumonia and patient was started on broad-spectrum antibiotics.  CT head pending.  Not much information could be obtained from the patient as patient is on ventilator.  Information obtained from the ER physician.  Pills were found around the patient when he was picked up by EMS.  Exactly we do not know whether patient has overdosed with medication.  Urine toxicology is pending.  PAST MEDICAL HISTORY:   Past Medical History:  Diagnosis Date  . DVT (deep venous thrombosis) ([place])   . Hyperlipidemia   . Hypertension   . Lupus   . Pulmonary embolism ([place])   . Renal disorder    Stage III    PAST SURGICAL HISTORY:  Past Surgical History:  Procedure Laterality Date  . ANKLE SURGERY Right   . CYST EXCISION  92 or 93    Liver cyst  removal [facility]  . I&D EXTREMITY Right 04/29/2017   Procedure: IRRIGATION AND DEBRIDEMENT EXTREMITY;  Surgeon: Clayburn Pert, MD;  Location: [facility] ORS;  Service: General;  Laterality: Right;  . IRRIGATION AND DEBRIDEMENT ABSCESS Left 04/29/2017   Procedure: IRRIGATION AND DEBRIDEMENT Scrotal ABSCESS;  Surgeon: Clayburn Pert, MD;  Location: [facility] ORS;  Service: General;  Laterality: Left;    SOCIAL HISTORY:  Social History   Tobacco Use  . Smoking status: Never Smoker  . Smokeless tobacco: Never Used  Substance Use Topics  . Alcohol use: No    FAMILY HISTORY:  Family History  Problem Relation Age of Onset  . Hypertension Father   . Heart disease Father   . Clotting disorder Mother   . Kidney disease Brother   . Heart attack Maternal Grandmother   . Heart attack Maternal Grandfather   . Heart attack Paternal Grandfather     DRUG ALLERGIES:  Allergies  Allergen Reactions  . Hydrocodone Rash  . Vicodin [Hydrocodone-Acetaminophen] Hives and Rash    Severe headaches (also)    REVIEW OF SYSTEMS:   Could not be obtained as patient is on ventilator  MEDICATIONS AT HOME:  Prior to Admission medications   Medication Sig Start Date End Date Taking? Authorizing Provider  amLODipine (NORVASC) 2.5 MG tablet Take 2.5 mg by mouth at bedtime. 10/04/17   [provider]  apixaban (ELIQUIS) 5 MG TABS tablet Take 5 mg by mouth once  a day 12/25/17   Cammie Sickle, MD  benzonatate (TESSALON) 200 MG capsule Take 1 capsule (200 mg total) by mouth 2 (two) times daily as needed for cough. 02/02/18   Roselee Nova, MD  diphenhydrAMINE (BENADRYL) 25 mg capsule Take 1 capsule (25 mg total) by mouth every 8 (eight) hours as needed. Patient not taking: Reported on 10/18/2017 08/06/17 10/18/17  Roselee Nova, MD  loperamide (IMODIUM A-D) 2 MG tablet Take 1 tablet (2 mg total) 4 (four) times daily as needed by mouth for diarrhea or loose stools. Patient not taking: Reported on  12/31/2017 11/06/17   Rudene Re, MD  losartan (COZAAR) 100 MG tablet Take 100 mg by mouth daily. 09/23/15   [provider]  mometasone (NASONEX) 50 MCG/ACT nasal spray Place 2 sprays into the nose daily. 02/02/18   Roselee Nova, MD  moxifloxacin (AVELOX) 400 MG tablet Take 1 tablet (400 mg total) by mouth daily. 02/02/18   Roselee Nova, MD  Multiple Vitamins-Minerals (MULTIVITAMIN WITH MINERALS) tablet Take 1 tablet by mouth daily.    [provider]  mupirocin ointment (BACTROBAN) 2 % Place 1 application into the nose 2 (two) times daily. Patient not taking: Reported on 10/18/2017 05/03/17   [name], MD  mycophenolate (CELLCEPT) 500 MG tablet Take 1,000 mg by mouth 2 (two) times daily.    [provider]  ondansetron (ZOFRAN ODT) 4 MG disintegrating tablet Take 1 tablet (4 mg total) every 8 (eight) hours as needed by mouth for nausea or vomiting. Patient not taking: Reported on 12/31/2017 11/06/17   Rudene Re, MD  pantoprazole (PROTONIX) 40 MG tablet TAKE 1 TABLET BY MOUTH EVERY DAY Patient not taking: Reported on 10/18/2017 06/08/17   Roselee Nova, MD  PARoxetine (PAXIL) 20 MG tablet Take 1 tablet (20 mg total) by mouth daily. 02/02/18   Roselee Nova, MD  prednisoLONE 5 MG TABS tablet Take 10 mg by mouth daily.    [provider]  predniSONE (DELTASONE) 10 MG tablet Take 1 tablet by mouth daily.    [provider]  promethazine (PHENERGAN) 50 MG tablet Take 0.5 tablets (25 mg total) by mouth every 6 (six) hours as needed for nausea or vomiting. Patient not taking: Reported on 10/18/2017 05/12/17   Roselee Nova, MD  rosuvastatin (CRESTOR) 10 MG tablet Take 1 tablet (10 mg total) by mouth at bedtime. Patient not taking: Reported on 10/18/2017 03/27/17   Roselee Nova, MD  traMADol (ULTRAM) 50 MG tablet Take 2 tablets (100 mg total) by mouth every 6 (six) hours as needed for moderate pain. Patient not taking:  Reported on 10/18/2017 05/03/17   [name], MD      PHYSICAL EXAMINATION:   VITAL SIGNS: Blood pressure (!) 61/42, pulse (!) 103, resp. rate 16, SpO2 100 %.  GENERAL:  57 y.o.-year-old patient lying in the bed on ventilator Initial Ventilator settings Tidal volume 500 Rate 18 PEEP 5 FiO2 100% EYES: Pupils equal, round, reactive to light and accommodation. No scleral icterus. Extraocular muscles intact.  HEENT: Head atraumatic, normocephalic. Oropharynx ET tube noted NECK:  Supple, no jugular venous distention. No thyroid enlargement, no tenderness.  LUNGS: Decreased breath sounds bilaterally , bilateral rales heard .  CARDIOVASCULAR: S1, S2 normal. No murmurs, rubs, or gallops.  ABDOMEN: Soft, nontender, nondistended. Bowel sounds present. No organomegaly or mass.  EXTREMITIES: No  cyanosis, or clubbing.  Left leg swelling present  Intraosseous line in the left lower extremity NEUROLOGIC: Not oriented to time, place and person On ventilator . Gait not checked.  PSYCHIATRIC: could not be assessed SKIN: No obvious rash, lesion, or ulcer.   LABORATORY PANEL:   CBC Recent Labs  Lab 02/21/18 0252  WBC 8.6  HGB 13.5  HCT 42.8  PLT 193  MCV 92.2  MCH 29.0  MCHC 31.4*  RDW 15.3*  LYMPHSABS 1.6  MONOABS 0.2  EOSABS 0.1  BASOSABS 0.1   ------------------------------------------------------------------------------------------------------------------  Chemistries  Recent Labs  Lab 02/21/18 0252  NA 139  K 4.7  CL 105  CO2 24  GLUCOSE 310*  BUN 31*  CREATININE 2.51*  CALCIUM 8.4*  AST 30  ALT 18  ALKPHOS 79  BILITOT 0.6   ------------------------------------------------------------------------------------------------------------------ estimated creatinine clearance is 43 mL/min (A) (by C-G formula based on SCr of 2.51 mg/dL (H)). ------------------------------------------------------------------------------------------------------------------ No results for  input(s): TSH, T4TOTAL, T3FREE, THYROIDAB in the last 72 hours.  Invalid input(s): FREET3   Coagulation profile Recent Labs  Lab 02/21/18 0252  INR 0.98   ------------------------------------------------------------------------------------------------------------------- No results for input(s): DDIMER in the last 72 hours. -------------------------------------------------------------------------------------------------------------------  Cardiac Enzymes Recent Labs  Lab 02/21/18 0252  TROPONINI <0.03   ------------------------------------------------------------------------------------------------------------------ Invalid input(s): POCBNP  ---------------------------------------------------------------------------------------------------------------  Urinalysis    Component Value Date/Time   COLORURINE YELLOW (A) 02/21/2018 0318   APPEARANCEUR HAZY (A) 02/21/2018 0318   APPEARANCEUR Clear 05/21/2017 1046   LABSPEC 1.014 02/21/2018 0318   LABSPEC 1.013 04/10/2015 1036   PHURINE 5.0 02/21/2018 0318   GLUCOSEU NEGATIVE 02/21/2018 0318   GLUCOSEU Negative 04/10/2015 1036   HGBUR LARGE (A) 02/21/2018 0318   BILIRUBINUR NEGATIVE 02/21/2018 0318   BILIRUBINUR Negative 05/21/2017 1046   BILIRUBINUR Negative 04/10/2015 1036   KETONESUR NEGATIVE 02/21/2018 0318   PROTEINUR 100 (A) 02/21/2018 0318   UROBILINOGEN 0.2 03/17/2015 0025   NITRITE NEGATIVE 02/21/2018 0318   LEUKOCYTESUR NEGATIVE 02/21/2018 0318   LEUKOCYTESUR Trace (A) 05/21/2017 1046   LEUKOCYTESUR Negative 04/10/2015 1036     RADIOLOGY: Dg Chest Portable 1 View  Result Date: 02/21/2018 CLINICAL DATA:  Respiratory failure.  Endotracheal tube placement. EXAM: PORTABLE CHEST 1 VIEW COMPARISON:  Most recent chest imaging CT 04/27/2017 FINDINGS: Endotracheal tube tip 3.2 cm from the carina. Low lung volumes. Borderline cardiomegaly. Ill-defined bilateral perihilar density. More diffuse right lung opacities most  prominent in the suprahilar lung. Possible small right pleural effusion. No pneumothorax. IMPRESSION: 1. Endotracheal tube tip 3.2 cm from the carina. 2. Ill-defined bilateral perihilar opacities likely pulmonary edema. 3. Patchy opacities throughout the right lung may be pneumonia, asymmetric pulmonary edema or aspiration. Electronically Signed   By: Jeb Levering M.D.   On: 02/21/2018 03:30    EKG: Orders placed or performed during the hospital encounter of 02/21/18  . ED EKG  . ED EKG    IMPRESSION AND PLAN: 58 year old male patient with history of DVT, pulmonary embolism, CKD stage III, lupus erythematosus presented to the emergency room with decreased responsiveness.  Admitting diagnosis 1.  Acute respiratory failure 2.  Altered mental status 3.  Aspiration pneumonia 4. Hypotension 5.  Chronic kidney disease stage III 6.  Lupus erythematosus 7.  History of DVT and pulmonary embolism Treatment plan Admit patient to ICU Continue mechanical ventilator Intensivist consultation IV fluids IV Precedex drip for sedation IV dopamine drip as needed for support of blood pressure IV vancomycin and IV Zosyn antibiotic Follow-up CT head Once CT head is negative for any bleed we can start  patient on IV heparin drip for anticoagulation Patient on oral Eliquis as outpatient for history of DVT and PE  All the records are reviewed and case discussed with ED provider. Management plans discussed with the patient, family and they are in agreement.  CODE STATUS:FULL CODE Code Status History    Date Active Date Inactive Code Status Order ID Comments User Context   05/07/2017 15:24 05/09/2017 17:11 Full Code [id number]  Epifanio Lesches, MD ED   04/27/2017 18:47 05/03/2017 20:12 Full Code [id number]  Vaughan Basta, MD Inpatient       TOTAL CRITICAL CARE TIME TAKING CARE OF THIS PATIENT: 55 minutes.    Saundra Shelling M.D on 02/21/2018 at 4:18 AM  Between 7am to 6pm - Pager -  [phone]  After 6pm go to [email or web address] - password [email or web address] [facility]  Office  [phone]  CC: Primary care physician; Arnetha Courser, MD

## 2018-02-21 NOTE — Progress Notes (Signed)
ANTICOAGULATION CONSULT NOTE - Initial Consult  Pharmacy Consult for heparin gtt Indication: pulmonary embolus  Allergies  Allergen Reactions  . Hydrocodone Rash  . Vicodin [Hydrocodone-Acetaminophen] Hives and Rash    Severe headaches (also)    Patient Measurements: Weight: 245 lb 9.5 oz (111.4 kg) Heparin Dosing Weight: 104kg  Vital Signs: Temp: 98.4 F (36.9 C) (03/03 1700) BP: 92/65 (03/03 1800) Pulse Rate: 80 (03/03 1800)  Labs: Recent Labs    02/21/18 0252 02/21/18 0543 02/21/18 1209 02/21/18 1600  HGB 13.5 14.6  --   --   HCT 42.8 46.2  --   --   PLT 193 153  --   --   APTT <24*  --   --   --   LABPROT 12.9  --   --   --   INR 0.98  --   --   --   HEPARINUNFRC  --   --   --  1.27*  CREATININE 2.51* 2.14*  --   --   TROPONINI <0.03 0.03* 0.06*  --     Estimated Creatinine Clearance: 51.2 mL/min (A) (by C-G formula based on SCr of 2.14 mg/dL (H)).   Medical History: Past Medical History:  Diagnosis Date  . Depression   . DVT (deep venous thrombosis) ([place])   . GERD (gastroesophageal reflux disease)   . Hyperlipidemia   . Hypertension   . Lupus   . Pulmonary embolism ([place])   . Renal disorder    Stage III    Medications:  Medications Prior to Admission  Medication Sig Dispense Refill Last Dose  . amLODipine (NORVASC) 5 MG tablet Take 2.5 mg by mouth at bedtime.   Unknown at Unknown  . benzonatate (TESSALON) 200 MG capsule Take 1 capsule (200 mg total) by mouth 2 (two) times daily as needed for cough. 20 capsule 0 prn at prn  . doxycycline (VIBRA-TABS) 100 MG tablet Take 100 mg by mouth 2 (two) times daily.   Unknown at Unknown  . losartan (COZAAR) 100 MG tablet Take 100 mg by mouth daily.  6 Unknown at Unknown  . mometasone (NASONEX) 50 MCG/ACT nasal spray Place 2 sprays into the nose daily. 17 g 0 Unknown at Unknown  . mycophenolate (CELLCEPT) 500 MG tablet Take 1,000 mg by mouth 2 (two) times daily.   Unknown at Unknown  . omeprazole (PRILOSEC  OTC) 20 MG tablet Take 20 mg by mouth daily.   Unknown at Unknown  . PARoxetine (PAXIL) 20 MG tablet Take 1 tablet (20 mg total) by mouth daily. 90 tablet 0 Unknown at Unknown  . sertraline (ZOLOFT) 50 MG tablet Take 50 mg by mouth daily.   Unknown at Unknown  . apixaban (ELIQUIS) 5 MG TABS tablet Take 5 mg by mouth once a day (Patient not taking: Reported on 02/21/2018) 5 tablet 0 Completed Course at Unknown time  . diphenhydrAMINE (BENADRYL) 25 mg capsule Take 1 capsule (25 mg total) by mouth every 8 (eight) hours as needed. (Patient not taking: Reported on 10/18/2017) 10 capsule 0 Not Taking  . loperamide (IMODIUM A-D) 2 MG tablet Take 1 tablet (2 mg total) 4 (four) times daily as needed by mouth for diarrhea or loose stools. (Patient not taking: Reported on 12/31/2017) 30 tablet 0 Not Taking  . moxifloxacin (AVELOX) 400 MG tablet Take 1 tablet (400 mg total) by mouth daily. (Patient not taking: Reported on 02/21/2018) 7 tablet 0 Completed Course at Unknown time  . Multiple Vitamins-Minerals (MULTIVITAMIN WITH MINERALS)  tablet Take 1 tablet by mouth daily.   Not Taking at Unknown time  . mupirocin ointment (BACTROBAN) 2 % Place 1 application into the nose 2 (two) times daily. (Patient not taking: Reported on 10/18/2017) 22 g 0 Not Taking  . ondansetron (ZOFRAN ODT) 4 MG disintegrating tablet Take 1 tablet (4 mg total) every 8 (eight) hours as needed by mouth for nausea or vomiting. (Patient not taking: Reported on 12/31/2017) 20 tablet 0 Not Taking at Unknown time  . pantoprazole (PROTONIX) 40 MG tablet TAKE 1 TABLET BY MOUTH EVERY DAY (Patient not taking: Reported on 10/18/2017) 30 tablet 0 Not Taking at Unknown time  . prednisoLONE 5 MG TABS tablet Take 10 mg by mouth daily.   Not Taking at Unknown time  . predniSONE (DELTASONE) 10 MG tablet Take 1 tablet by mouth daily.   Not Taking at Unknown time  . promethazine (PHENERGAN) 50 MG tablet Take 0.5 tablets (25 mg total) by mouth every 6 (six) hours as  needed for nausea or vomiting. (Patient not taking: Reported on 10/18/2017) 30 tablet 0 Not Taking at Unknown time  . rosuvastatin (CRESTOR) 10 MG tablet Take 1 tablet (10 mg total) by mouth at bedtime. (Patient not taking: Reported on 10/18/2017) 90 tablet 0 Not Taking at Unknown time  . traMADol (ULTRAM) 50 MG tablet Take 2 tablets (100 mg total) by mouth every 6 (six) hours as needed for moderate pain. (Patient not taking: Reported on 10/18/2017) 30 tablet 0 Not Taking at Unknown time   Scheduled:  . chlorhexidine gluconate (MEDLINE KIT)  15 mL Mouth Rinse BID  . feeding supplement (PRO-STAT SUGAR FREE 64)  60 mL Per Tube TID  . feeding supplement (VITAL HIGH PROTEIN)  1,000 mL Per Tube Q24H  . furosemide  40 mg Intravenous Q12H  . mouth rinse  15 mL Mouth Rinse 10 times per day  . methylPREDNISolone (SOLU-MEDROL) injection  40 mg Intravenous Q4H  . multivitamin  15 mL Per Tube Daily  . sertraline  50 mg Oral Daily  . vecuronium  10 mg Intravenous Once   Infusions:  . fentaNYL infusion INTRAVENOUS 200 mcg/hr (02/21/18 1800)  . heparin    . norepinephrine (LEVOPHED) Adult infusion    . piperacillin-tazobactam    . vancomycin Stopped (02/21/18 1400)  . vasopressin (PITRESSIN) infusion - *FOR SHOCK*     PRN: fentaNYL, heparin, hydrALAZINE, midazolam, ondansetron **OR** ondansetron (ZOFRAN) IV Anti-infectives (From admission, onward)   Start     Dose/Rate Route Frequency Ordered Stop   02/21/18 2100  piperacillin-tazobactam (ZOSYN) IVPB 3.375 g     3.375 g 100 mL/hr over 30 Minutes Intravenous Every 6 hours 02/21/18 1749     02/21/18 1200  vancomycin (VANCOCIN) 1,250 mg in sodium chloride 0.9 % 250 mL IVPB  Status:  Discontinued     1,250 mg 166.7 mL/hr over 90 Minutes Intravenous Every 24 hours 02/21/18 0617 02/21/18 0624   02/21/18 1200  vancomycin (VANCOCIN) 1,500 mg in sodium chloride 0.9 % 500 mL IVPB     1,500 mg 250 mL/hr over 120 Minutes Intravenous Every 24 hours 02/21/18  0624     02/21/18 0430  piperacillin-tazobactam (ZOSYN) IVPB 3.375 g  Status:  Discontinued     3.375 g 12.5 mL/hr over 240 Minutes Intravenous Every 8 hours 02/21/18 0419 02/21/18 1749   02/21/18 0430  vancomycin (VANCOCIN) IVPB 1000 mg/200 mL premix     1,000 mg 200 mL/hr over 60 Minutes Intravenous  Once 02/21/18 0419 02/21/18  3614   02/21/18 0400  Ampicillin-Sulbactam (UNASYN) 3 g in sodium chloride 0.9 % 100 mL IVPB  Status:  Discontinued     3 g 200 mL/hr over 30 Minutes Intravenous STAT 02/21/18 0350 02/21/18 0517      Assessment: 57 year old male with PE requiring anticoagulation via heparin gtt for PE, per pharmacy protocol.  Goal of Therapy:  Heparin level 0.3-0.7 units/ml Monitor platelets by anticoagulation protocol: Yes   Plan:  Give 4000 units bolus x 1 Start heparin infusion at 1600 units/hr Check anti-Xa level in 6 hours and daily while on heparin Continue to monitor H&H and platelets  3/3@1830 , HL 1.27. Hold heparin gtt 1 hour, also will decrease heparin gtt from iv 1600units/hr to 1300units/hr. Will rechech HL in 6 hours per protocol.     Donna Christen Nyonna Hargrove 02/21/2018,6:31 PM

## 2018-02-21 NOTE — Progress Notes (Signed)
w   02/21/18 0536  Vitals  BP 105/75  MAP (mmHg) 86  Pulse Rate (!) 35  ECG Heart Rate (!) 35  Resp 20  Oxygen Therapy  SpO2 (!) 86 %  O2 Device Ventilator  End Tidal CO2 (EtCO2) 31  Spoke With Dr Lynetta Mare about the above listed vital signs.  Stated to discontinue to the precedex and give ordered fentanyl.

## 2018-02-21 NOTE — Procedures (Signed)
Central Venous Catheter Placement:TRIPLE LUMEN Indication: Patient receiving vesicant or irritant drug.; Patient receiving intravenous therapy for longer than 5 days.; Patient has limited or no vascular access.   Consent:emergent    Hand washing performed prior to starting the procedure.   Procedure:   An active timeout was performed and correct patient, name, & ID confirmed.   Patient was positioned correctly for central venous access.  Patient was prepped using strict sterile technique including chlorohexadine preps, sterile drape, sterile gown and sterile gloves.    The area was prepped, draped and anesthetized in the usual sterile manner. Patient comfort was obtained.    A triple lumen catheter was placed in RT  Internal Jugular Vein There was good blood return, catheter caps were placed on lumens, catheter flushed easily, the line was secured and a sterile dressing and BIO-PATCH applied.   Ultrasound was used to visualize vasculature and guidance of needle.   Number of Attempts: 1 Complications:none Estimated Blood Loss: none Chest Radiograph indicated and ordered.  Operator: Brooke Payes.   Jonathon Snow, M.D.  [facility]  Medical Director ICU-[facility] Medical Director [facility] Cardio-Pulmonary Department     

## 2018-02-21 NOTE — Progress Notes (Signed)
Patient on vent. Initial setting vt 500 rr18 peep 5 100% o2 per verbal order Dr Karma Greaser. ABG obtained. New orders. Vent changes made. New abg pending. Patient tolerated all respiratory interventions well. See flowsheet for RT documentation

## 2018-02-21 NOTE — Progress Notes (Signed)
Initial Nutrition Assessment  DOCUMENTATION CODES:   Obesity unspecified  INTERVENTION:  Initiate Vital High Protein at 40 mL/hr (960 mL goal daily volume) + Pro-Stat 60 mL TID via OGT. Provides 1560 kcal, 174 grams of protein, 806 mL H2O daily.  Provide liquid MVI daily per tube as goal TF rate does not meet 100% RDIs for vitamins/minerals.  NUTRITION DIAGNOSIS:   Inadequate oral intake related to inability to eat as evidenced by NPO status.  GOAL:   Provide needs based on ASPEN/SCCM guidelines  MONITOR:   Vent status, Labs, Weight trends, TF tolerance, I & O's  REASON FOR ASSESSMENT:   Ventilator, Consult Enteral/tube feeding initiation and management, Assessment of nutrition requirement/status  ASSESSMENT:   57 year old male with PMHx of HTN, pulmonary embolism, DVT on Eliquis, HLD, lupus erythematosus, CKD stage III, GERD, depression who was brought in after being found unresponsive found to have severe respiratory failure with ARDS/ALI and was emergently intubated early AM 3/3, also with renal failure.   -Per chart patient was actively vomiting this morning.  No family members at bedside at time of RD assessment. Per chart patient appears to be weight stable/gaining weight. He was 231.6 lbs on 05/12/2017 and has gained about 14 lbs since then. Abdomen feels soft on NFPE. Extremities are cold to the touch, but he is on [employer].  Access: 16 Fr. OGT placed 3/3; terminates in the stomach per abdominal x-ray 3/3; 75 cm at corner of mouth  MAP: 78-93 mmHg  Patient is currently intubated on ventilator support MV: 10.1 L/min Temp (24hrs), Avg:94.1 F (34.5 C), Min:93.9 F (34.4 C), Max:94.3 F (34.6 C)  Propofol: N/A  Medications reviewed and include: Lasix 80 mg IV once today, Solu-Medrol 40 mg Q4hrs IV, Zoloft, NS @ 100 mL/hr, fentanyl gtt, heparin gtt, Zosyn, vancomycin.  Labs reviewed: CBG 165, CO2 20, BUN 31, Creatinine 2.14, eGFR 33.  Patient does not meet  criteria for malnutrition at this time.  Discussed with RN.  NUTRITION - FOCUSED PHYSICAL EXAM:    Most Recent Value  Orbital Region  No depletion  Upper Arm Region  No depletion  Thoracic and Lumbar Region  No depletion  Buccal Region  Unable to assess  Temple Region  No depletion  Clavicle Bone Region  No depletion  Clavicle and Acromion Bone Region  No depletion  Scapular Bone Region  Unable to assess  Dorsal Hand  No depletion  Patellar Region  No depletion  Anterior Thigh Region  No depletion  Posterior Calf Region  No depletion  Edema (RD Assessment)  None  Hair  Reviewed  Eyes  Unable to assess  Mouth  Unable to assess  Skin  Reviewed  Nails  Reviewed     Diet Order:  Diet NPO time specified  EDUCATION NEEDS:   No education needs have been identified at this time  Skin:  Skin Assessment: Reviewed RN Assessment(scattered ecchymosis)  Last BM:  Unknown  Height:   Ht Readings from Last 1 Encounters:  02/02/18 '6\' 2"'$  (1.88 m)    Weight:   Wt Readings from Last 1 Encounters:  02/21/18 245 lb 9.5 oz (111.4 kg)    Ideal Body Weight:  86.4 kg(calculated with ht of '6\' 2"'$  from previous encounter)  BMI:  Body mass index is 31.53 kg/m.  Estimated Nutritional Needs:   Kcal:  1225-1560 (11-14 kcal/kg)  Protein:  >/= 173 grams (>/= 2 grams/kg IBW)  Fluid:  2.1-2.5 L/day (25-30 mL/kg IBW)  Hanni Milford  Minette Brine, [place], [place], [place] Office: [phone] Pager: [phone] After Hours/Weekend Pager: [phone]
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
## 2018-02-21 NOTE — Progress Notes (Signed)
   02/21/18 0520  Vitals  BP 108/73  MAP (mmHg) 85  Pulse Rate 67  ECG Heart Rate 63  Resp (!) 24  Oxygen Therapy  SpO2 (!) 88 %  End [place] CO2 (EtCO2) 24  Notified [facility]. Elta [place], RN on camera.  Also noted bradycardic episodes for HR into the 30s.  Stated notify MD.  Return call

## 2018-02-21 NOTE — Progress Notes (Signed)
[name]  ROUNDING NOTE   Subjective:   [name] admitted to [facility] on 02/21/2018 for Acute respiratory failure with hypoxia ([place]) [J96.01] Drug overdose, undetermined intent, initial encounter [T50.904A] Hypotension, unspecified hypotension type [I95.9] Acute renal failure, unspecified acute renal failure type ([place]) [N17.9] Aspiration pneumonia due to gastric secretions, unspecified laterality, unspecified part of lung ([place]) [J69.0]   Daughter at bedside who assists with history taking. Patient was found [place] motel unresponsive surrounded by many different pills.   Patient intubated and brought to ICU.   Nephrology consulted for acute renal failure. Started on IV fluids and has some urine output.   Objective:  Vital signs in last 24 hours:  Temp:  [93.9 F (34.4 C)-94.3 F (34.6 C)] 94.3 F (34.6 C) (03/03 0800) Pulse Rate:  [35-128] 76 (03/03 0900) Resp:  [6-27] 24 (03/03 0900) BP: (61-183)/(42-120) 112/84 (03/03 0900) SpO2:  [86 %-100 %] 88 % (03/03 0900) FiO2 (%):  [100 %] 100 % (03/03 0715) Weight:  [111.4 kg (245 lb 9.5 oz)] 111.4 kg (245 lb 9.5 oz) (03/03 0509)  Weight change:  Filed Weights   02/21/18 0509  Weight: 111.4 kg (245 lb 9.5 oz)    Intake/Output: I/O last 3 completed shifts: In: 3300 [I.V.:50; IV Piggyback:3250] Out: 565 [Urine:525; Other:40]   Intake/Output this shift:  Total I/O In: -  Out: 350 [Urine:350]  Physical Exam: General: Critically Ill  Head: ETT, OGT  Eyes: Anicteric, PERRL  Neck:  right IJ triple lumen  Lungs:  Bilateral crackles  Heart: Regular rate and rhythm  Abdomen:  Soft, nontender  Extremities: no peripheral edema.  Neurologic: Intubated, sedated  Skin: No lesions  Access: Right femoral temp HD catheter Dr. Mortimer Snow 3/3    Basic Metabolic Panel: Recent Labs  Lab 02/21/18 0252 02/21/18 0543  NA 139 140  K 4.7 4.2  CL 105 110  CO2 24 20*  GLUCOSE 310* 147*  BUN 31* 31*  CREATININE 2.51*  2.14*  CALCIUM 8.4* 7.8*    Liver Function Tests: Recent Labs  Lab 02/21/18 0252 02/21/18 0543  AST 30 29  ALT 18 21  ALKPHOS 79 81  BILITOT 0.6 0.4  PROT 6.4* 6.4*  ALBUMIN 3.4* 3.2*   Recent Labs  Lab 02/21/18 0252  LIPASE 56*   No results for input(s): AMMONIA in the last 168 hours.  CBC: Recent Labs  Lab 02/21/18 0252 02/21/18 0543  WBC 8.6 3.9  NEUTROABS 6.7*  --   HGB 13.5 14.6  HCT 42.8 46.2  MCV 92.2 91.8  PLT 193 153    Cardiac Enzymes: Recent Labs  Lab 02/21/18 0252 02/21/18 0543  TROPONINI <0.03 0.03*    BNP: Invalid input(s): POCBNP  CBG: Recent Labs  Lab 02/21/18 0502  GLUCAP 165*    Microbiology: Results for orders placed or performed during the hospital encounter of 02/21/18  MRSA PCR Screening     Status: None   Collection Time: 02/21/18  4:23 AM  Result Value Ref Range Status   MRSA by PCR NEGATIVE NEGATIVE Final    Comment:        The GeneXpert MRSA Assay (FDA approved for NASAL specimens only), is one component of a comprehensive MRSA colonization surveillance program. It is not intended to diagnose MRSA infection nor to guide or monitor treatment for MRSA infections. Performed at [facility], [address] [place], [place] [address]     Coagulation Studies: Recent Labs    02/21/18 [id number]  LABPROT 12.9  INR 0.98    Urinalysis: Recent Labs    02/21/18 0318  COLORURINE YELLOW*  LABSPEC 1.014  PHURINE 5.0  GLUCOSEU NEGATIVE  HGBUR LARGE*  BILIRUBINUR NEGATIVE  KETONESUR NEGATIVE  PROTEINUR 100*  NITRITE NEGATIVE  LEUKOCYTESUR NEGATIVE      Imaging: Dg Abdomen 1 View  Result Date: 02/21/2018 CLINICAL DATA:  Orogastric tube placement. EXAM: ABDOMEN - 1 VIEW COMPARISON:  CT 11/06/2017 FINDINGS: Tip and side port of the enteric tube below the diaphragm in the stomach. No bowel dilatation to suggest obstruction. IVC filter slightly tilted to the left, unchanged from prior CT. No evidence of free  air. IMPRESSION: Tip and side port of the enteric tube below the diaphragm in the stomach. Electronically Signed   By: Jeb Levering M.D.   On: 02/21/2018 04:18   Ct Head Wo Contrast  Result Date: 02/21/2018 CLINICAL DATA:  57 y/o  M; found unconscious. EXAM: CT HEAD WITHOUT CONTRAST TECHNIQUE: Contiguous axial images were obtained from the base of the skull through the vertex without intravenous contrast. COMPARISON:  12/10/2013 CT head. FINDINGS: Brain: No evidence of acute infarction, hemorrhage, hydrocephalus, extra-axial collection or mass lesion/mass effect. Vascular: No hyperdense vessel. Calcific atherosclerosis of carotid siphons. Skull: Normal. Negative for fracture or focal lesion. Sinuses/Orbits: Multiple paranasal sinus fluid levels. Normal aeration of mastoid air cells. Orbits incompletely included within field of view, no traumatic finding identified. Other: None. IMPRESSION: 1. No acute intracranial abnormality or calvarial fracture. 2. Multiple sinus fluid levels may represent acute sinusitis or facial trauma, clinical correlation recommended. Electronically Signed   By: Kristine Garbe M.D.   On: 02/21/2018 04:45   Dg Chest Port 1 View  Result Date: 02/21/2018 CLINICAL DATA:  57 year old male status post central line placement. Respiratory failure, intubated. EXAM: PORTABLE CHEST 1 VIEW COMPARISON:  0311 hours today and earlier. FINDINGS: Portable AP semi upright view at 0944 hours. A right IJ central line now is in place. The catheter tip projects at the cavoatrial junction level. An enteric tube has been placed, courses to the abdomen, tip not included. Endotracheal tube tip is stable up the level the clavicles. More kyphotic positioning now. No pneumothorax identified. Veiling opacity in the right lung. Increased retrocardiac opacity right greater than left. Stable left lung aside from left retrocardiac opacity. Pulmonary vascularity appears stable. IMPRESSION: 1. Right IJ  central line placed, tip at the cavoatrial junction level. 2. Enteric tube placed, courses to the abdomen tip not included. 3. No pneumothorax, but increasing veiling and confluent opacity in the right lung suggesting right pleural effusion plus new right lower lobe collapse or consolidation. Electronically Signed   By: Genevie Ann M.D.   On: 02/21/2018 10:04   Dg Chest Portable 1 View  Result Date: 02/21/2018 CLINICAL DATA:  Respiratory failure.  Endotracheal tube placement. EXAM: PORTABLE CHEST 1 VIEW COMPARISON:  Most recent chest imaging CT 04/27/2017 FINDINGS: Endotracheal tube tip 3.2 cm from the carina. Low lung volumes. Borderline cardiomegaly. Ill-defined bilateral perihilar density. More diffuse right lung opacities most prominent in the suprahilar lung. Possible small right pleural effusion. No pneumothorax. IMPRESSION: 1. Endotracheal tube tip 3.2 cm from the carina. 2. Ill-defined bilateral perihilar opacities likely pulmonary edema. 3. Patchy opacities throughout the right lung may be pneumonia, asymmetric pulmonary edema or aspiration. Electronically Signed   By: Jeb Levering M.D.   On: 02/21/2018 03:30     Medications:   . sodium chloride 100 mL/hr at 02/21/18 0600  . fentaNYL  infusion INTRAVENOUS    . heparin    . piperacillin-tazobactam (ZOSYN)  IV 3.375 g (02/21/18 0605)  . vancomycin     . chlorhexidine gluconate (MEDLINE KIT)  15 mL Mouth Rinse BID  . furosemide  80 mg Intravenous Once  . heparin  4,000 Units Intravenous Once  . mouth rinse  15 mL Mouth Rinse 10 times per day  . methylPREDNISolone (SOLU-MEDROL) injection  40 mg Intravenous Q4H  . sertraline  50 mg Oral Daily  . vecuronium  10 mg Intravenous Once   fentaNYL, hydrALAZINE, midazolam, ondansetron **OR** ondansetron (ZOFRAN) IV  Assessment/ Plan:  [name] is a 57 y.o. white male with lupus nephritis on mycophenolate, DVT/pulmonary embolus, hypertension, hyperlipidemia admitted to [facility] ICU on  02/21/2018. Intubated and sedated.   1. Acute Renal Failure on chronic kidney disease stage III: baseline creatinine of 1.75, GFR of 44 on 10/2017.  Chronic kidney disease stage III with hematuria and proteinuria secondary to lupus nephritis.  Acute renal failure from ATN from acute illness, prerenal azotemia and hypoxia Nonoliguric urine output. No acute indication for dialysis. Temp HD catheter placed today by Dr. Mortimer Snow.  - Discontinue IV fluids  - Start furosemide IV 36m x 1 and then 445mIV q12 - Recommend restarting mycophenolate when more stable.   2. Acute respiratory failure requiring mechanical ventilation: on PRVC. With pulmonary edema and right Upper lobe infiltration on CXR. History of aspiration pneumonia in the past.  - Empiric antibiotics: zosyn and vanco - methylprednisone   LOS: 0 Liara Holm 3/3/201910:26 AM

## 2018-02-21 NOTE — ED Notes (Signed)
Patient taken to [employer] by this RN and Demetria, RT

## 2018-02-21 NOTE — Progress Notes (Signed)
Pharmacy has been consulted for renal dosing adjustment in the setting of CRRT. Per RN Katie patient is going to be on CVVHD starting this evening.    Vancomycin will continue at 1000mg  iv q24h Zosyn will be changed to zosyn 3.375gm iv q6h (36min infusion)  Thomasenia Sales, PharmD, MBA, [facility]

## 2018-02-21 NOTE — Progress Notes (Signed)
New vent order received. Vent changes made.

## 2018-02-21 NOTE — Procedures (Signed)
Central Venous Dailysis Catheter Placement: DOUBLE LUMEN  Indication: Hemo Dialysis/CRRT   Consent:emergent   Hand washing performed prior to starting the procedure.   Procedure: An active timeout was performed and correct patient, name, & ID confirmed. Patient was positioned correctly for central venous access. Patient was prepped using strict sterile technique including chlorohexadine preps, sterile drape, sterile gown and sterile gloves.  The area was prepped, draped and anesthetized in the usual sterile manner. Patient comfort was obtained.    A Double lumen catheter was placed in RT FEMORAL  Vein There was good blood return, catheter caps were placed on lumens, catheter flushed easily, the line was secured and a sterile dressing and BIO-PATCH applied.   Ultrasound was used to visualize vasculature and guidance of needle.   Number of Attempts: 1 Complications:none  Estimated Blood Loss: none Operator: Lemario Chaikin.   Corrin Parker, M.D.  [facility]  Medical Director [facility] Director [facility] Cardio-Pulmonary Department

## 2018-02-21 NOTE — Progress Notes (Addendum)
Pharmacy Antibiotic Note  PETER KEYWORTH is a 57 y.o. male admitted on 02/21/2018 with pneumonia.  Pharmacy has been consulted for vanc/zosyn dosing.  Plan: Patient received vanc 1g and zosyn 3.375g IV x 1  Will start patient on vanc 1.5g IV q24h w/ 6 hour stack. Will draw a vanc trough on 03/06 @ 1100 prior to 4th dose. Will continue zosyn 3.375g IV q8h  Ke 0.040 T1/2 17 ~ 24 hrs and increased dose to 1.5g Goal trough 15 - 20 mcg/mL MRSA PCR pending  Temp (24hrs), Avg:94 F (34.4 C), Min:93.9 F (34.4 C), Max:94.1 F (34.5 C)  Recent Labs  Lab 02/21/18 0252 02/21/18 0318 02/21/18 0543  WBC 8.6  --  3.9  CREATININE 2.51*  --   --   LATICACIDVEN  --  0.4*  --     Estimated Creatinine Clearance: 43 mL/min (A) (by C-G formula based on SCr of 2.51 mg/dL (H)).    Allergies  Allergen Reactions  . Hydrocodone Rash  . Vicodin [Hydrocodone-Acetaminophen] Hives and Rash    Severe headaches (also)    Thank you for allowing pharmacy to be a part of this patient's care.  Tobie Lords, PharmD, BCPS Clinical Pharmacist 02/21/2018

## 2018-02-21 NOTE — Consult Note (Signed)
[facility]  PATIENT NAME: Jonathon Snow    MR#:  [id number]  DATE OF BIRTH:  04/10/1961  DATE OF ADMISSION:  02/21/2018  PRIMARY CARE PHYSICIAN: Arnetha Courser, MD   REQUESTING/REFERRING PHYSICIAN:   CHIEF COMPLAINT:   Chief Complaint  Patient presents with  . Unresponsive  resp failure  HISTORY OF PRESENT ILLNESS:  Jonathon Snow  is a 57 y.o. male with a known history of deep venous thrombosis on Eliquis for anticoagulation, lupus erythematosus, pulmonary embolism, chronic kidney disease stage III, hyperlipidemia, hypertension was brought to the emergency room unresponsive.   The patient was picked up by EMS and they found to be patient in a agonal breathing.  Patient was lethargic, confused with decreased responsiveness.  He was intubated and put on ventilator in the emergency room Patient was started on IV Precedex drip for sedation.  His blood pressure was low when he presented to the emergency room he was given IV fluids and started on IV dopamine drip  -Once blood pressure improved dopamine drip was weaned off -Chest x-ray revealed aspiration pneumonia and patient was started on broad-spectrum antibiotics  Patient with severe resp failure fio2 at100% PEEP at 8   PAST MEDICAL HISTORY:   Past Medical History:  Diagnosis Date  . Depression   . DVT (deep venous thrombosis) ([place])   . GERD (gastroesophageal reflux disease)   . Hyperlipidemia   . Hypertension   . Lupus   . Pulmonary embolism ([place])   . Renal disorder    Stage III    PAST SURGICAL HISTORY:  Past Surgical History:  Procedure Laterality Date  . ANKLE SURGERY Right   . CYST EXCISION  92 or 93    Liver cyst removal [facility]  . I&D EXTREMITY Right 04/29/2017   Procedure: IRRIGATION AND DEBRIDEMENT EXTREMITY;  Surgeon: Clayburn Pert, MD;  Location: [facility] ORS;  Service: General;  Laterality: Right;  . IRRIGATION AND DEBRIDEMENT ABSCESS Left 04/29/2017   Procedure: IRRIGATION AND DEBRIDEMENT  Scrotal ABSCESS;  Surgeon: Clayburn Pert, MD;  Location: [facility] ORS;  Service: General;  Laterality: Left;    SOCIAL HISTORY:  Social History   Tobacco Use  . Smoking status: Never Smoker  . Smokeless tobacco: Never Used  Substance Use Topics  . Alcohol use: No    FAMILY HISTORY:  Family History  Problem Relation Age of Onset  . Hypertension Father   . Heart disease Father   . Clotting disorder Mother   . Kidney disease Brother   . Heart attack Maternal Grandmother   . Heart attack Maternal Grandfather   . Heart attack Paternal Grandfather     DRUG ALLERGIES:  Allergies  Allergen Reactions  . Hydrocodone Rash  . Vicodin [Hydrocodone-Acetaminophen] Hives and Rash    Severe headaches (also)    REVIEW OF SYSTEMS:   Could not be obtained as patient is on ventilator  MEDICATIONS AT HOME:  Prior to Admission medications   Medication Sig Start Date End Date Taking? Authorizing Provider  amLODipine (NORVASC) 2.5 MG tablet Take 2.5 mg by mouth at bedtime. 10/04/17   [provider]  apixaban (ELIQUIS) 5 MG TABS tablet Take 5 mg by mouth once a day 12/25/17   Cammie Sickle, MD  benzonatate (TESSALON) 200 MG capsule Take 1 capsule (200 mg total) by mouth 2 (two) times daily as needed for cough. 02/02/18   Roselee Nova, MD  diphenhydrAMINE (BENADRYL) 25 mg capsule Take 1 capsule (25 mg total) by mouth  every 8 (eight) hours as needed. Patient not taking: Reported on 10/18/2017 08/06/17 10/18/17  Roselee Nova, MD  loperamide (IMODIUM A-D) 2 MG tablet Take 1 tablet (2 mg total) 4 (four) times daily as needed by mouth for diarrhea or loose stools. Patient not taking: Reported on 12/31/2017 11/06/17   Rudene Re, MD  losartan (COZAAR) 100 MG tablet Take 100 mg by mouth daily. 09/23/15   [provider]  mometasone (NASONEX) 50 MCG/ACT nasal spray Place 2 sprays into the nose daily. 02/02/18   Roselee Nova, MD  moxifloxacin (AVELOX) 400 MG  tablet Take 1 tablet (400 mg total) by mouth daily. 02/02/18   Roselee Nova, MD  Multiple Vitamins-Minerals (MULTIVITAMIN WITH MINERALS) tablet Take 1 tablet by mouth daily.    [provider]  mupirocin ointment (BACTROBAN) 2 % Place 1 application into the nose 2 (two) times daily. Patient not taking: Reported on 10/18/2017 05/03/17   [name], MD  mycophenolate (CELLCEPT) 500 MG tablet Take 1,000 mg by mouth 2 (two) times daily.    [provider]  ondansetron (ZOFRAN ODT) 4 MG disintegrating tablet Take 1 tablet (4 mg total) every 8 (eight) hours as needed by mouth for nausea or vomiting. Patient not taking: Reported on 12/31/2017 11/06/17   Rudene Re, MD  pantoprazole (PROTONIX) 40 MG tablet TAKE 1 TABLET BY MOUTH EVERY DAY Patient not taking: Reported on 10/18/2017 06/08/17   Roselee Nova, MD  PARoxetine (PAXIL) 20 MG tablet Take 1 tablet (20 mg total) by mouth daily. 02/02/18   Roselee Nova, MD  prednisoLONE 5 MG TABS tablet Take 10 mg by mouth daily.    [provider]  predniSONE (DELTASONE) 10 MG tablet Take 1 tablet by mouth daily.    [provider]  promethazine (PHENERGAN) 50 MG tablet Take 0.5 tablets (25 mg total) by mouth every 6 (six) hours as needed for nausea or vomiting. Patient not taking: Reported on 10/18/2017 05/12/17   Roselee Nova, MD  rosuvastatin (CRESTOR) 10 MG tablet Take 1 tablet (10 mg total) by mouth at bedtime. Patient not taking: Reported on 10/18/2017 03/27/17   Roselee Nova, MD  traMADol (ULTRAM) 50 MG tablet Take 2 tablets (100 mg total) by mouth every 6 (six) hours as needed for moderate pain. Patient not taking: Reported on 10/18/2017 05/03/17   [name], MD      PHYSICAL EXAMINATION:   VITAL SIGNS: Blood pressure 120/83, pulse 86, temperature (!) 94.1 F (34.5 C), temperature source Rectal, resp. rate 17, weight 245 lb 9.5 oz (111.4 kg), SpO2 90 %.  GENERAL:  57 y.o.-year-old  patient lying in the bed on ventilator Initial Ventilator settings Tidal volume 500 Rate 18 PEEP 5 FiO2 100%  PHYSICAL EXAMINATION:  GENERAL:critically ill appearing, +resp distress HEAD: Normocephalic, atraumatic.  EYES: Pupils equal, round, reactive to light.  No scleral icterus.  MOUTH: Moist mucosal membrane. NECK: Supple. No thyromegaly. No nodules. No JVD.  PULMONARY: +rhonchi, +wheezing CARDIOVASCULAR: S1 and S2. Regular rate and rhythm. No murmurs, rubs, or gallops.  GASTROINTESTINAL: Soft, nontender, -distended. No masses. Positive bowel sounds. No hepatosplenomegaly.  MUSCULOSKELETAL: No swelling, clubbing, or edema.  NEUROLOGIC: obtunded SKIN:intact,warm,dry   LABORATORY PANEL:   CBC Recent Labs  Lab 02/21/18 0252 02/21/18 0543  WBC 8.6 3.9  HGB 13.5 14.6  HCT 42.8 46.2  PLT 193 153  MCV 92.2 91.8  MCH 29.0 29.0  MCHC 31.4* 31.6*  RDW 15.3* 15.5*  LYMPHSABS 1.6  --   MONOABS 0.2  --   EOSABS 0.1  --   BASOSABS 0.1  --    ------------------------------------------------------------------------------------------------------------------  Chemistries  Recent Labs  Lab 02/21/18 0252 02/21/18 0543  NA 139 140  K 4.7 4.2  CL 105 110  CO2 24 20*  GLUCOSE 310* 147*  BUN 31* 31*  CREATININE 2.51* 2.14*  CALCIUM 8.4* 7.8*  AST 30 29  ALT 18 21  ALKPHOS 79 81  BILITOT 0.6 0.4   ------------------------------------------------------------------------------------------------------------------ estimated creatinine clearance is 51.2 mL/min (A) (by C-G formula based on SCr of 2.14 mg/dL (H)). ------------------------------------------------------------------------------------------------------------------ No results for input(s): TSH, T4TOTAL, T3FREE, THYROIDAB in the last 72 hours.  Invalid input(s): FREET3   Coagulation profile Recent Labs  Lab 02/21/18 0252  INR 0.98    ------------------------------------------------------------------------------------------------------------------- No results for input(s): DDIMER in the last 72 hours. -------------------------------------------------------------------------------------------------------------------  Cardiac Enzymes Recent Labs  Lab 02/21/18 0252 02/21/18 0543  TROPONINI <0.03 0.03*   ------------------------------------------------------------------------------------------------------------------ Invalid input(s): POCBNP  ---------------------------------------------------------------------------------------------------------------  Urinalysis    Component Value Date/Time   COLORURINE YELLOW (A) 02/21/2018 0318   APPEARANCEUR HAZY (A) 02/21/2018 0318   APPEARANCEUR Clear 05/21/2017 1046   LABSPEC 1.014 02/21/2018 0318   LABSPEC 1.013 04/10/2015 1036   PHURINE 5.0 02/21/2018 0318   GLUCOSEU NEGATIVE 02/21/2018 0318   GLUCOSEU Negative 04/10/2015 1036   HGBUR LARGE (A) 02/21/2018 0318   BILIRUBINUR NEGATIVE 02/21/2018 0318   BILIRUBINUR Negative 05/21/2017 1046   BILIRUBINUR Negative 04/10/2015 1036   KETONESUR NEGATIVE 02/21/2018 0318   PROTEINUR 100 (A) 02/21/2018 0318   UROBILINOGEN 0.2 03/17/2015 0025   NITRITE NEGATIVE 02/21/2018 0318   LEUKOCYTESUR NEGATIVE 02/21/2018 0318   LEUKOCYTESUR Trace (A) 05/21/2017 1046   LEUKOCYTESUR Negative 04/10/2015 1036     RADIOLOGY: Dg Abdomen 1 View  Result Date: 02/21/2018 CLINICAL DATA:  Orogastric tube placement. EXAM: ABDOMEN - 1 VIEW COMPARISON:  CT 11/06/2017 FINDINGS: Tip and side port of the enteric tube below the diaphragm in the stomach. No bowel dilatation to suggest obstruction. IVC filter slightly tilted to the left, unchanged from prior CT. No evidence of free air. IMPRESSION: Tip and side port of the enteric tube below the diaphragm in the stomach. Electronically Signed   By: Jeb Levering M.D.   On: 02/21/2018 04:18   Ct  Head Wo Contrast  Result Date: 02/21/2018 CLINICAL DATA:  57 y/o  M; found unconscious. EXAM: CT HEAD WITHOUT CONTRAST TECHNIQUE: Contiguous axial images were obtained from the base of the skull through the vertex without intravenous contrast. COMPARISON:  12/10/2013 CT head. FINDINGS: Brain: No evidence of acute infarction, hemorrhage, hydrocephalus, extra-axial collection or mass lesion/mass effect. Vascular: No hyperdense vessel. Calcific atherosclerosis of carotid siphons. Skull: Normal. Negative for fracture or focal lesion. Sinuses/Orbits: Multiple paranasal sinus fluid levels. Normal aeration of mastoid air cells. Orbits incompletely included within field of view, no traumatic finding identified. Other: None. IMPRESSION: 1. No acute intracranial abnormality or calvarial fracture. 2. Multiple sinus fluid levels may represent acute sinusitis or facial trauma, clinical correlation recommended. Electronically Signed   By: Kristine Garbe M.D.   On: 02/21/2018 04:45   Dg Chest Portable 1 View  Result Date: 02/21/2018 CLINICAL DATA:  Respiratory failure.  Endotracheal tube placement. EXAM: PORTABLE CHEST 1 VIEW COMPARISON:  Most recent chest imaging CT 04/27/2017 FINDINGS: Endotracheal tube tip 3.2 cm from the carina. Low lung volumes. Borderline cardiomegaly. Ill-defined bilateral perihilar density. More diffuse right lung opacities most prominent  in the suprahilar lung. Possible small right pleural effusion. No pneumothorax. IMPRESSION: 1. Endotracheal tube tip 3.2 cm from the carina. 2. Ill-defined bilateral perihilar opacities likely pulmonary edema. 3. Patchy opacities throughout the right lung may be pneumonia, asymmetric pulmonary edema or aspiration. Electronically Signed   By: Jeb Levering M.D.   On: 02/21/2018 03:30    EKG: Orders placed or performed during the hospital encounter of 02/21/18  . ED EKG  . ED EKG  . EKG 12-Lead  . EKG 12-Lead    IMPRESSION AND PLAN: 57 year old  male patient with history of DVT, pulmonary embolism, CKD stage III, lupus erythematosus presented to the emergency room with decreased responsiveness. Patient with severe resp failure ARDS/ALI with renal failure  1.Respiratory Failure -continue Full MV support -continue Bronchodilator Therapy -Wean Fio2 and PEEP as tolerated IV vancomycin and IV Zosyn antibiotic  2.Renal Failure-most likely due to ATN -follow chem 7 -follow UO -continue Foley Catheter-assess need May need CRRT  3.- intubated and sedated - minimal sedation to achieve a RASS goal: -1   4.h/o DVT/PE -Start heparin infusion   Critical Care Time devoted to patient care services described in this note is 55 minutes.   Overall, patient is critically ill, prognosis is guarded.  Patient with Multiorgan failure and at high risk for cardiac arrest and death.    Corrin Parker, M.D.  [facility]  Medical Director [place] Director [facility] Cardio-Pulmonary Department

## 2018-02-21 NOTE — Progress Notes (Signed)
57 year old male patient with history of DVT, pulmonary embolism, CKD stage III, lupus erythematosus presented to the emergency room with decreased responsiveness.  Admitting diagnosis 1.  Acute respiratory failure 2.  Altered mental status 3.  Aspiration pneumonia 4. Hypotension 5.  Chronic kidney disease stage III 6.  Lupus erythematosus 7.  History of DVT and pulmonary embolism Treatment plan Admit patient to ICU Continue mechanical ventilator Intensivist consultation IV fluids IV Precedex drip for sedation IV dopamine drip as needed for support of blood pressure IV vancomycin and IV Zosyn antibiotic Follow-up CT head Once CT head is negative for any bleed we can start patient on IV heparin drip for anticoagulation Patient on oral Eliquis as outpatient for history of DVT and PE  Agree with above stated plan

## 2018-02-21 NOTE — Progress Notes (Signed)
Troponin elevated , Dr Mortimer Fries notified, no new orders noted

## 2018-02-21 NOTE — Progress Notes (Signed)
Pt HR NSR 70s while at bedside.  Pt with bradycardia to pulseless at left carotid pulse.  Pt sternal rubbed with a return of pulse.  Pt became newly agitated with episode of emesis and hypoxia of 85% on the monitor.  Notified [facility] physician.  Pt sedation and paralyzed to improve oxygenation.  Dr Mortimer Fries to bedside for assessment.

## 2018-02-21 NOTE — ED Notes (Signed)
RT verbalized color change after ET placement

## 2018-02-21 NOTE — Progress Notes (Signed)
Pt actively vonmitting

## 2018-02-21 NOTE — ED Triage Notes (Signed)
Patient from home via EMS stating patient is non-responsive.  He was found in emesis and appears to have aspirated.  EMS gave 4mg  Narcan IV (IO) en route to [facility], stating that patient became responsive as they neared the ED.  CPR was not performed on patient, EMS stated they only assisted with breathing.  Patient arrived with plastic bag of prescription pills.  Pt wavering hands in the air and trying to talk as he was transferred off the gurney to the bed.

## 2018-02-21 NOTE — Progress Notes (Signed)
Addendum  Through the day, patient become progressively hemodynamically unstable. Requiring vasopressors and anuric.  Discussed case with Dr. Mortimer Fries and have decided to place patient on continous renal replacement therapy.   Lavonia Dana MD [facility]

## 2018-02-21 NOTE — Progress Notes (Signed)
Pt was resting well with MAP of 8o-90 , then BP suddenly dropped, 91/50 and 68 systolic, repostioned, remained low, MD notified, Levophed ordered , titrated to a MAP of 65

## 2018-02-21 NOTE — Progress Notes (Signed)
   02/21/18 0300  Clinical Encounter Type  Visited With Patient not available  Visit Type ED  Referral From Nurse  Consult/Referral To Chaplain  Spiritual Encounters  Spiritual Needs Prayer   [place] received a PG to report to ED for PT arrival. [facility] provided ministry of presence and prayed silently. No family was present. [place] will follow up in ICU.

## 2018-02-22 ENCOUNTER — Telehealth: Payer: Self-pay | Admitting: Family Medicine

## 2018-02-22 DIAGNOSIS — N170 Acute kidney failure with tubular necrosis: Secondary | ICD-10-CM

## 2018-02-22 LAB — GLUCOSE, CAPILLARY
GLUCOSE-CAPILLARY: 150 mg/dL — AB (ref 65–99)
GLUCOSE-CAPILLARY: 155 mg/dL — AB (ref 65–99)
GLUCOSE-CAPILLARY: 177 mg/dL — AB (ref 65–99)
Glucose-Capillary: 169 mg/dL — ABNORMAL HIGH (ref 65–99)
Glucose-Capillary: 125 mg/dL — ABNORMAL HIGH (ref 65–99)
Glucose-Capillary: 136 mg/dL — ABNORMAL HIGH (ref 65–99)

## 2018-02-22 LAB — RENAL FUNCTION PANEL
ALBUMIN: 2.8 g/dL — AB (ref 3.5–5.0)
ALBUMIN: 2.7 g/dL — AB (ref 3.5–5.0)
ANION GAP: 8 (ref 5–15)
Albumin: 2.7 g/dL — ABNORMAL LOW (ref 3.5–5.0)
Albumin: 2.7 g/dL — ABNORMAL LOW (ref 3.5–5.0)
Anion gap: 9 (ref 5–15)
Anion gap: 9 (ref 5–15)
Anion gap: 10 (ref 5–15)
BUN: 32 mg/dL — ABNORMAL HIGH (ref 6–20)
BUN: 25 mg/dL — AB (ref 6–20)
BUN: 31 mg/dL — AB (ref 6–20)
BUN: 27 mg/dL — AB (ref 6–20)
CALCIUM: 7.4 mg/dL — AB (ref 8.9–10.3)
CHLORIDE: 104 mmol/L (ref 101–111)
CHLORIDE: 100 mmol/L — AB (ref 101–111)
CO2: 25 mmol/L (ref 22–32)
CO2: 25 mmol/L (ref 22–32)
CO2: 27 mmol/L (ref 22–32)
CO2: 29 mmol/L (ref 22–32)
CREATININE: 1.73 mg/dL — AB (ref 0.61–1.24)
CREATININE: 1.63 mg/dL — AB (ref 0.61–1.24)
CREATININE: 1.56 mg/dL — AB (ref 0.61–1.24)
Calcium: 7.4 mg/dL — ABNORMAL LOW (ref 8.9–10.3)
Calcium: 7.5 mg/dL — ABNORMAL LOW (ref 8.9–10.3)
Calcium: 7.9 mg/dL — ABNORMAL LOW (ref 8.9–10.3)
Chloride: 107 mmol/L (ref 101–111)
Chloride: 103 mmol/L (ref 101–111)
Creatinine, Ser: 1.78 mg/dL — ABNORMAL HIGH (ref 0.61–1.24)
GFR calc Af Amer: 47 mL/min — ABNORMAL LOW (ref >60)
GFR calc Af Amer: 49 mL/min — ABNORMAL LOW (ref >60)
GFR calc Af Amer: 56 mL/min — ABNORMAL LOW (ref >60)
GFR calc non Af Amer: 41 mL/min — ABNORMAL LOW (ref >60)
GFR calc non Af Amer: 42 mL/min — ABNORMAL LOW (ref >60)
GFR, EST AFRICAN AMERICAN: 53 mL/min — AB (ref >60)
GFR, EST NON AFRICAN AMERICAN: 46 mL/min — AB (ref >60)
GFR, EST NON AFRICAN AMERICAN: 48 mL/min — AB (ref >60)
GLUCOSE: 213 mg/dL — AB (ref 65–99)
Glucose, Bld: 189 mg/dL — ABNORMAL HIGH (ref 65–99)
Glucose, Bld: 215 mg/dL — ABNORMAL HIGH (ref 65–99)
Glucose, Bld: 174 mg/dL — ABNORMAL HIGH (ref 65–99)
PHOSPHORUS: 3.4 mg/dL (ref 2.5–4.6)
PHOSPHORUS: 3.1 mg/dL (ref 2.5–4.6)
POTASSIUM: 4.1 mmol/L (ref 3.5–5.1)
Phosphorus: 3.5 mg/dL (ref 2.5–4.6)
Phosphorus: 2.6 mg/dL (ref 2.5–4.6)
Potassium: 4.7 mmol/L (ref 3.5–5.1)
Potassium: 4.3 mmol/L (ref 3.5–5.1)
Potassium: 4 mmol/L (ref 3.5–5.1)
SODIUM: 140 mmol/L (ref 135–145)
Sodium: 138 mmol/L (ref 135–145)
Sodium: 139 mmol/L (ref 135–145)
Sodium: 139 mmol/L (ref 135–145)

## 2018-02-22 LAB — BLOOD GAS, ARTERIAL
Acid-base deficit: 1 mmol/L (ref 0.0–2.0)
Bicarbonate: 25.8 mmol/L (ref 20.0–28.0)
FIO2: 0.8
MECHVT: 500 mL
O2 Saturation: 98.3 %
PEEP: 8 cmH2O
Patient temperature: 37
RATE: 24 resp/min
pCO2 arterial: 50 mmHg — ABNORMAL HIGH (ref 32.0–48.0)
pH, Arterial: 7.32 — ABNORMAL LOW (ref 7.350–7.450)
pO2, Arterial: 119 mmHg — ABNORMAL HIGH (ref 83.0–108.0)

## 2018-02-22 LAB — CBC
HCT: 40.7 % (ref 40.0–52.0)
HEMOGLOBIN: 13.1 g/dL (ref 13.0–18.0)
MCH: 29.3 pg (ref 26.0–34.0)
MCHC: 32.3 g/dL (ref 32.0–36.0)
MCV: 90.8 fL (ref 80.0–100.0)
Platelets: 104 10*3/uL — ABNORMAL LOW (ref 150–440)
RBC: 4.48 MIL/uL (ref 4.40–5.90)
RDW: 15 % — ABNORMAL HIGH (ref 11.5–14.5)
WBC: 15.5 10*3/uL — ABNORMAL HIGH (ref 3.8–10.6)

## 2018-02-22 LAB — LACTIC ACID, PLASMA: Lactic Acid, Venous: 2.2 mmol/L (ref 0.5–1.9)

## 2018-02-22 LAB — MAGNESIUM
MAGNESIUM: 1.5 mg/dL — AB (ref 1.7–2.4)
MAGNESIUM: 2.1 mg/dL (ref 1.7–2.4)
MAGNESIUM: 2.2 mg/dL (ref 1.7–2.4)
MAGNESIUM: 2.8 mg/dL — AB (ref 1.7–2.4)

## 2018-02-22 LAB — APTT: aPTT: >160 seconds (ref 24–36)

## 2018-02-22 LAB — HEPARIN LEVEL (UNFRACTIONATED)
HEPARIN UNFRACTIONATED: 1.04 [IU]/mL — AB (ref 0.30–0.70)
Heparin Unfractionated: 1.09 IU/mL — ABNORMAL HIGH (ref 0.30–0.70)
Heparin Unfractionated: 0.48 IU/mL (ref 0.30–0.70)

## 2018-02-22 MED ORDER — PIPERACILLIN-TAZOBACTAM 3.375 G IVPB
3.3750 g | Freq: Three times a day (TID) | INTRAVENOUS | Status: DC
  Administered 2018-02-22 – 2018-02-26 (×12): 3.375 g via INTRAVENOUS
  Filled 2018-02-22 (×11): qty 50

## 2018-02-22 MED ORDER — INSULIN ASPART 100 UNIT/ML ~~LOC~~ SOLN
0.0000 [IU] | SUBCUTANEOUS | Status: DC
  Administered 2018-02-22 (×2): 2 [IU] via SUBCUTANEOUS
  Administered 2018-02-22 (×3): 3 [IU] via SUBCUTANEOUS
  Administered 2018-02-22 – 2018-02-23 (×2): 2 [IU] via SUBCUTANEOUS
  Administered 2018-02-23: 3 [IU] via SUBCUTANEOUS
  Administered 2018-02-23 (×4): 2 [IU] via SUBCUTANEOUS
  Administered 2018-02-24: 3 [IU] via SUBCUTANEOUS
  Administered 2018-02-24: 2 [IU] via SUBCUTANEOUS
  Administered 2018-02-24: 3 [IU] via SUBCUTANEOUS
  Administered 2018-02-24 – 2018-02-25 (×4): 2 [IU] via SUBCUTANEOUS
  Administered 2018-02-25: 3 [IU] via SUBCUTANEOUS
  Administered 2018-02-25 – 2018-02-26 (×2): 2 [IU] via SUBCUTANEOUS
  Filled 2018-02-22 (×22): qty 1

## 2018-02-22 MED ORDER — LORAZEPAM 2 MG/ML IJ SOLN
4.0000 mg | Freq: Once | INTRAMUSCULAR | Status: AC
  Administered 2018-02-22: 4 mg via INTRAVENOUS
  Filled 2018-02-22: qty 2

## 2018-02-22 MED ORDER — BUDESONIDE 0.5 MG/2ML IN SUSP
0.5000 mg | Freq: Two times a day (BID) | RESPIRATORY_TRACT | Status: DC
  Administered 2018-02-22 – 2018-02-25 (×7): 0.5 mg via RESPIRATORY_TRACT
  Filled 2018-02-22 (×7): qty 2

## 2018-02-22 MED ORDER — HEPARIN (PORCINE) IN NACL 100-0.45 UNIT/ML-% IJ SOLN
1400.0000 [IU]/h | INTRAMUSCULAR | Status: DC
  Administered 2018-02-22: 800 [IU]/h via INTRAVENOUS
  Administered 2018-02-23 (×2): 1000 [IU]/h via INTRAVENOUS
  Administered 2018-02-24: 1200 [IU]/h via INTRAVENOUS
  Administered 2018-02-24: 1400 [IU]/h via INTRAVENOUS
  Filled 2018-02-22 (×3): qty 250

## 2018-02-22 MED ORDER — HEPARIN (PORCINE) IN NACL 100-0.45 UNIT/ML-% IJ SOLN: 1100.0000 [IU]/h | INTRAMUSCULAR | Status: DC

## 2018-02-22 MED ORDER — METHYLPREDNISOLONE SODIUM SUCC 40 MG IJ SOLR
40.0000 mg | Freq: Two times a day (BID) | INTRAMUSCULAR | Status: DC
  Administered 2018-02-22 – 2018-02-25 (×6): 40 mg via INTRAVENOUS
  Filled 2018-02-22 (×6): qty 1

## 2018-02-22 MED ORDER — HEPARIN (PORCINE) IN NACL 100-0.45 UNIT/ML-% IJ SOLN
800.0000 [IU]/h | INTRAMUSCULAR | Status: DC
  Administered 2018-02-22: 1100 [IU]/h via INTRAVENOUS

## 2018-02-22 MED ORDER — IPRATROPIUM-ALBUTEROL 0.5-2.5 (3) MG/3ML IN SOLN
3.0000 mL | RESPIRATORY_TRACT | Status: DC
  Administered 2018-02-22 – 2018-02-25 (×19): 3 mL via RESPIRATORY_TRACT
  Filled 2018-02-22 (×11): qty 3
  Filled 2018-02-22: qty 6
  Filled 2018-02-22 (×5): qty 3

## 2018-02-22 MED ORDER — PUREFLOW DIALYSIS SOLUTION
INTRAVENOUS | Status: DC
  Administered 2018-02-22: 03:00:00 via INTRAVENOUS_CENTRAL
  Administered 2018-02-22: 3 via INTRAVENOUS_CENTRAL

## 2018-02-22 MED ORDER — MAGNESIUM SULFATE 4 GM/100ML IV SOLN
4.0000 g | Freq: Once | INTRAVENOUS | Status: AC
  Administered 2018-02-22: 4 g via INTRAVENOUS
  Filled 2018-02-22: qty 100

## 2018-02-22 MED ORDER — CHLORHEXIDINE GLUCONATE 0.12 % MT SOLN
OROMUCOSAL | Status: AC
  Filled 2018-02-22: qty 15

## 2018-02-22 MED ORDER — PANTOPRAZOLE SODIUM 40 MG IV SOLR
40.0000 mg | INTRAVENOUS | Status: DC
Start: 2018-02-22 — End: 2018-02-26
  Administered 2018-02-22 – 2018-02-26 (×5): 40 mg via INTRAVENOUS
  Filled 2018-02-22 (×5): qty 40

## 2018-02-22 NOTE — Progress Notes (Signed)
Lab notified this nurse that PTT elevated. Dr. Mortimer Fries notified. Wilnette Kales

## 2018-02-22 NOTE — Progress Notes (Signed)
[name]  ROUNDING NOTE   Subjective:  Patient was started on CRRT yesterday. Urine output has increased to 1.8 L over the preceding 24 hours. Patient has been off of CellCept since admission. Continues to have respiratory failure, FiO2 currently 50%.   Objective:  Vital signs in last 24 hours:  Temp:  [95.9 F (35.5 C)-99 F (37.2 C)] 97.8 F (36.6 C) (03/04 0800) Pulse Rate:  [59-103] 64 (03/04 0800) Resp:  [11-32] 24 (03/04 0800) BP: (71-177)/(51-139) 126/59 (03/04 0800) SpO2:  [78 %-100 %] 100 % (03/04 0800) FiO2 (%):  [50 %-100 %] 50 % (03/04 0811) Weight:  [114.2 kg (251 lb 12.3 oz)] 114.2 kg (251 lb 12.3 oz) (03/04 0456)  Weight change: 2.8 kg (6 lb 2.8 oz) Filed Weights   02/21/18 0509 02/22/18 0456  Weight: 111.4 kg (245 lb 9.5 oz) 114.2 kg (251 lb 12.3 oz)    Intake/Output: I/O last 3 completed shifts: In: 6843.8 [P.O.:100; I.V.:2488.8; NG/GT:480; IV Piggyback:3775] Out: 2450 [Urine:2410; Other:40]   Intake/Output this shift:  No intake/output data recorded.  Physical Exam: General: Critically Ill  Head: ETT, OGT  Eyes: Anicteric  Neck: right IJ triple lumen  Lungs:  Bilateral crackles, vent assisted  Heart: Regular rate and rhythm  Abdomen:  Soft, nontender, BS present  Extremities: 1+ peripheral edema.  Neurologic: Intubated, sedated  Skin: No lesions  Access: Right femoral temp HD catheter Dr. Mortimer Fries 3/3    Basic Metabolic Panel: Recent Labs  Lab 02/21/18 1720 02/21/18 2150 02/22/18 0048 02/22/18 0452 02/22/18 0817  NA 139  138 140 140 138 139  K 5.6*  5.6* 5.4* 4.7 4.3 4.1  CL 111  112* 107 107 104 103  CO2 17*  21* _0 GLUCOSE 142*  143* 208* 213* 189* 215*  BUN 38*  37* 34* 32* 31* 27*  CREATININE 2.65*  2.69* 2.20* 1.78* 1.73* 1.63*  CALCIUM 7.8*  7.9* 7.5* 7.4* 7.4* 7.5*  MG 1.7  1.6*  --  1.5* 2.8* 2.2  PHOS 4.0 3.8 3.4 3.5 3.1    Liver Function Tests: Recent Labs  Lab 02/21/18 0252  02/21/18 0543 02/21/18 1720 02/21/18 2150 02/22/18 0048 02/22/18 0452 02/22/18 0817  AST 30 29  --   --   --   --   --   ALT 18 21  --   --   --   --   --   ALKPHOS 79 81  --   --   --   --   --   BILITOT 0.6 0.4  --   --   --   --   --   PROT 6.4* 6.4*  --   --   --   --   --   ALBUMIN 3.4* 3.2* 3.0* 2.8* 2.8* 2.7* 2.7*   Recent Labs  Lab 02/21/18 0252  LIPASE 56*   No results for input(s): AMMONIA in the last 168 hours.  CBC: Recent Labs  Lab 02/21/18 0252 02/21/18 0543 02/22/18 0452  WBC 8.6 3.9 15.5*  NEUTROABS 6.7*  --   --   HGB 13.5 14.6 13.1  HCT 42.8 46.2 40.7  MCV 92.2 91.8 90.8  PLT 193 153 104*    Cardiac Enzymes: Recent Labs  Lab 02/21/18 0252 02/21/18 0543 02/21/18 1209 02/21/18 1720  TROPONINI <0.03 0.03* 0.06* 0.07*    BNP: Invalid input(s): POCBNP  CBG: Recent Labs  Lab 02/21/18 1236 02/21/18 1608 02/21/18 2013 02/22/18 0418 02/22/18 0719  GLUCAP 82 111* 14* 98* 177*    Microbiology: Results for orders placed or performed during the hospital encounter of 02/21/18  MRSA PCR Screening     Status: None   Collection Time: 02/21/18  4:23 AM  Result Value Ref Range Status   MRSA by PCR NEGATIVE NEGATIVE Final    Comment:        The GeneXpert MRSA Assay (FDA approved for NASAL specimens only), is one component of a comprehensive MRSA colonization surveillance program. It is not intended to diagnose MRSA infection nor to guide or monitor treatment for MRSA infections. Performed at [facility], [address] [place], [place] [address]     Coagulation Studies: Recent Labs    02/21/18 0252  LABPROT 12.9  INR 0.98    Urinalysis: Recent Labs    02/21/18 0318  COLORURINE YELLOW*  LABSPEC 1.014  PHURINE 5.0  GLUCOSEU NEGATIVE  HGBUR LARGE*  BILIRUBINUR NEGATIVE  KETONESUR NEGATIVE  PROTEINUR 100*  NITRITE NEGATIVE  LEUKOCYTESUR NEGATIVE      Imaging: Dg Abdomen 1 View  Result Date:  02/21/2018 CLINICAL DATA:  Orogastric tube placement. EXAM: ABDOMEN - 1 VIEW COMPARISON:  CT 11/06/2017 FINDINGS: Tip and side port of the enteric tube below the diaphragm in the stomach. No bowel dilatation to suggest obstruction. IVC filter slightly tilted to the left, unchanged from prior CT. No evidence of free air. IMPRESSION: Tip and side port of the enteric tube below the diaphragm in the stomach. Electronically Signed   By: Jeb Levering M.D.   On: 02/21/2018 04:18   Ct Head Wo Contrast  Result Date: 02/21/2018 CLINICAL DATA:  57 y/o  M; found unconscious. EXAM: CT HEAD WITHOUT CONTRAST TECHNIQUE: Contiguous axial images were obtained from the base of the skull through the vertex without intravenous contrast. COMPARISON:  12/10/2013 CT head. FINDINGS: Brain: No evidence of acute infarction, hemorrhage, hydrocephalus, extra-axial collection or mass lesion/mass effect. Vascular: No hyperdense vessel. Calcific atherosclerosis of carotid siphons. Skull: Normal. Negative for fracture or focal lesion. Sinuses/Orbits: Multiple paranasal sinus fluid levels. Normal aeration of mastoid air cells. Orbits incompletely included within field of view, no traumatic finding identified. Other: None. IMPRESSION: 1. No acute intracranial abnormality or calvarial fracture. 2. Multiple sinus fluid levels may represent acute sinusitis or facial trauma, clinical correlation recommended. Electronically Signed   By: Kristine Garbe M.D.   On: 02/21/2018 04:45   Dg Chest Port 1 View  Result Date: 02/21/2018 CLINICAL DATA:  57 year old male status post central line placement. Respiratory failure, intubated. EXAM: PORTABLE CHEST 1 VIEW COMPARISON:  0311 hours today and earlier. FINDINGS: Portable AP semi upright view at 0944 hours. A right IJ central line now is in place. The catheter tip projects at the cavoatrial junction level. An enteric tube has been placed, courses to the abdomen, tip not included. Endotracheal  tube tip is stable up the level the clavicles. More kyphotic positioning now. No pneumothorax identified. Veiling opacity in the right lung. Increased retrocardiac opacity right greater than left. Stable left lung aside from left retrocardiac opacity. Pulmonary vascularity appears stable. IMPRESSION: 1. Right IJ central line placed, tip at the cavoatrial junction level. 2. Enteric tube placed, courses to the abdomen tip not included. 3. No pneumothorax, but increasing veiling and confluent opacity in the right lung suggesting right pleural effusion plus new right lower lobe collapse or consolidation. Electronically Signed   By: Genevie Ann M.D.   On: 02/21/2018 10:04   Dg Chest Portable 1  View  Result Date: 02/21/2018 CLINICAL DATA:  Respiratory failure.  Endotracheal tube placement. EXAM: PORTABLE CHEST 1 VIEW COMPARISON:  Most recent chest imaging CT 04/27/2017 FINDINGS: Endotracheal tube tip 3.2 cm from the carina. Low lung volumes. Borderline cardiomegaly. Ill-defined bilateral perihilar density. More diffuse right lung opacities most prominent in the suprahilar lung. Possible small right pleural effusion. No pneumothorax. IMPRESSION: 1. Endotracheal tube tip 3.2 cm from the carina. 2. Ill-defined bilateral perihilar opacities likely pulmonary edema. 3. Patchy opacities throughout the right lung may be pneumonia, asymmetric pulmonary edema or aspiration. Electronically Signed   By: Jeb Levering M.D.   On: 02/21/2018 03:30     Medications:   . fentaNYL infusion INTRAVENOUS 300 mcg/hr (02/22/18 0749)  . heparin 1,100 Units/hr (02/22/18 0630)  . norepinephrine (LEVOPHED) Adult infusion 12 mcg/min (02/22/18 0837)  . phenylephrine (NEO-SYNEPHRINE) Adult infusion    . piperacillin-tazobactam Stopped (02/22/18 0630)  . pureflow 3 each (02/22/18 0925)  .  sodium bicarbonate  infusion 1000 mL 150 mL/hr at 02/22/18 0630  . vancomycin Stopped (02/21/18 1400)  . vasopressin (PITRESSIN) infusion - *FOR  SHOCK* 0.03 Units/min (02/22/18 0630)   . chlorhexidine gluconate (MEDLINE KIT)  15 mL Mouth Rinse BID  . feeding supplement (PRO-STAT SUGAR FREE 64)  60 mL Per Tube TID  . feeding supplement (VITAL HIGH PROTEIN)  1,000 mL Per Tube Q24H  . furosemide  40 mg Intravenous Q12H  . insulin aspart  0-15 Units Subcutaneous Q4H  . mouth rinse  15 mL Mouth Rinse 10 times per day  . methylPREDNISolone (SOLU-MEDROL) injection  40 mg Intravenous Q4H  . multivitamin  15 mL Per Tube Daily  . sertraline  50 mg Oral Daily  . vecuronium  10 mg Intravenous Once   fentaNYL, heparin, hydrALAZINE, midazolam, ondansetron **OR** ondansetron (ZOFRAN) IV  Assessment/ Plan:  [name] is a 57 y.o. 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 Intubated and sedated.   1. Acute Renal Failure on chronic kidney disease stage III: baseline creatinine of 1.75, GFR of 44 on 10/2017.  Chronic kidney disease stage III with hematuria and proteinuria secondary to lupus nephritis.  Acute renal failure from ATN from acute illness, prerenal azotemia and hypoxia -It appears that urine output has improved.  Yesterday urine output was 1.8 L.  Therefore we will go ahead and discontinue CRRT.  Continue Lasix 40 mg IV every 12 hours.  2. Acute respiratory failure requiring mechanical ventilation: Continue ventilatory support as well as Zosyn and vancomycin.  3.  Hypotension.  Maintain the patient on norepinephrine to maintain a map of 65 or greater.  LOS: 1 Skilynn Durney 3/4/20199:26 AM

## 2018-02-22 NOTE — Progress Notes (Signed)
ANTICOAGULATION CONSULT NOTE - Initial Consult  Pharmacy Consult for heparin gtt Indication: pulmonary embolus  Allergies  Allergen Reactions  . Hydrocodone Rash  . Vicodin [Hydrocodone-Acetaminophen] Hives and Rash    Severe headaches (also)    Patient Measurements: Weight: 251 lb 12.3 oz (114.2 kg) Heparin Dosing Weight: 104kg  Vital Signs: Temp: 98.9 F (37.2 C) (03/04 1600) Temp Source: Axillary (03/04 1600) BP: 117/59 (03/04 1900) Pulse Rate: 71 (03/04 1900)  Labs: Recent Labs    02/21/18 0252 02/21/18 0543 02/21/18 1209  02/21/18 1720  02/22/18 0041  02/22/18 0452 02/22/18 0817 02/22/18 1035 02/22/18 1152 02/22/18 1925  HGB 13.5 14.6  --   --   --   --   --   --  13.1  --   --   --   --   HCT 42.8 46.2  --   --   --   --   --   --  40.7  --   --   --   --   PLT 193 153  --   --   --   --   --   --  104*  --   --   --   --   APTT <24*  --   --   --   --   --   --   --   --   --  >160*  --   --   LABPROT 12.9  --   --   --   --   --   --   --   --   --   --   --   --   INR 0.98  --   --   --   --   --   --   --   --   --   --   --   --   HEPARINUNFRC  --   --   --    < >  --   --  1.04*  --   --   --  1.09*  --  0.48  CREATININE 2.51* 2.14*  --   --  2.65*  2.69*   < >  --    < > 1.73* 1.63*  --  1.56*  --   TROPONINI <0.03 0.03* 0.06*  --  0.07*  --   --   --   --   --   --   --   --    < > = values in this interval not displayed.    Estimated Creatinine Clearance: 71 mL/min (A) (by C-G formula based on SCr of 1.56 mg/dL (H)).   Medical History: Past Medical History:  Diagnosis Date  . Depression   . DVT (deep venous thrombosis) ([place])   . GERD (gastroesophageal reflux disease)   . Hyperlipidemia   . Hypertension   . Lupus   . Pulmonary embolism ([place])   . Renal disorder    Stage III    Medications:  Medications Prior to Admission  Medication Sig Dispense Refill Last Dose  . amLODipine (NORVASC) 5 MG tablet Take 2.5 mg by mouth at bedtime.    Unknown at Unknown  . benzonatate (TESSALON) 200 MG capsule Take 1 capsule (200 mg total) by mouth 2 (two) times daily as needed for cough. 20 capsule 0 prn at prn  . doxycycline (VIBRA-TABS) 100 MG tablet Take 100 mg by mouth 2 (two) times daily.   Unknown at  Unknown  . losartan (COZAAR) 100 MG tablet Take 100 mg by mouth daily.  6 Unknown at Unknown  . mometasone (NASONEX) 50 MCG/ACT nasal spray Place 2 sprays into the nose daily. 17 g 0 Unknown at Unknown  . mycophenolate (CELLCEPT) 500 MG tablet Take 1,000 mg by mouth 2 (two) times daily.   Unknown at Unknown  . omeprazole (PRILOSEC OTC) 20 MG tablet Take 20 mg by mouth daily.   Unknown at Unknown  . PARoxetine (PAXIL) 20 MG tablet Take 1 tablet (20 mg total) by mouth daily. 90 tablet 0 Unknown at Unknown  . sertraline (ZOLOFT) 50 MG tablet Take 50 mg by mouth daily.   Unknown at Unknown  . apixaban (ELIQUIS) 5 MG TABS tablet Take 5 mg by mouth once a day (Patient not taking: Reported on 02/21/2018) 5 tablet 0 Completed Course at Unknown time  . diphenhydrAMINE (BENADRYL) 25 mg capsule Take 1 capsule (25 mg total) by mouth every 8 (eight) hours as needed. (Patient not taking: Reported on 10/18/2017) 10 capsule 0 Not Taking  . loperamide (IMODIUM A-D) 2 MG tablet Take 1 tablet (2 mg total) 4 (four) times daily as needed by mouth for diarrhea or loose stools. (Patient not taking: Reported on 12/31/2017) 30 tablet 0 Not Taking  . moxifloxacin (AVELOX) 400 MG tablet Take 1 tablet (400 mg total) by mouth daily. (Patient not taking: Reported on 02/21/2018) 7 tablet 0 Completed Course at Unknown time  . Multiple Vitamins-Minerals (MULTIVITAMIN WITH MINERALS) tablet Take 1 tablet by mouth daily.   Not Taking at Unknown time  . mupirocin ointment (BACTROBAN) 2 % Place 1 application into the nose 2 (two) times daily. (Patient not taking: Reported on 10/18/2017) 22 g 0 Not Taking  . ondansetron (ZOFRAN ODT) 4 MG disintegrating tablet Take 1 tablet (4 mg total)  every 8 (eight) hours as needed by mouth for nausea or vomiting. (Patient not taking: Reported on 12/31/2017) 20 tablet 0 Not Taking at Unknown time  . pantoprazole (PROTONIX) 40 MG tablet TAKE 1 TABLET BY MOUTH EVERY DAY (Patient not taking: Reported on 10/18/2017) 30 tablet 0 Not Taking at Unknown time  . prednisoLONE 5 MG TABS tablet Take 10 mg by mouth daily.   Not Taking at Unknown time  . predniSONE (DELTASONE) 10 MG tablet Take 1 tablet by mouth daily.   Not Taking at Unknown time  . promethazine (PHENERGAN) 50 MG tablet Take 0.5 tablets (25 mg total) by mouth every 6 (six) hours as needed for nausea or vomiting. (Patient not taking: Reported on 10/18/2017) 30 tablet 0 Not Taking at Unknown time  . rosuvastatin (CRESTOR) 10 MG tablet Take 1 tablet (10 mg total) by mouth at bedtime. (Patient not taking: Reported on 10/18/2017) 90 tablet 0 Not Taking at Unknown time  . traMADol (ULTRAM) 50 MG tablet Take 2 tablets (100 mg total) by mouth every 6 (six) hours as needed for moderate pain. (Patient not taking: Reported on 10/18/2017) 30 tablet 0 Not Taking at Unknown time   Scheduled:  . budesonide (PULMICORT) nebulizer solution  0.5 mg Nebulization BID  . chlorhexidine gluconate (MEDLINE KIT)  15 mL Mouth Rinse BID  . feeding supplement (PRO-STAT SUGAR FREE 64)  60 mL Per Tube TID  . feeding supplement (VITAL HIGH PROTEIN)  1,000 mL Per Tube Q24H  . furosemide  40 mg Intravenous Q12H  . insulin aspart  0-15 Units Subcutaneous Q4H  . ipratropium-albuterol  3 mL Nebulization Q4H  . mouth rinse  15 mL Mouth Rinse 10 times per day  . methylPREDNISolone (SOLU-MEDROL) injection  40 mg Intravenous Q12H  . multivitamin  15 mL Per Tube Daily  . pantoprazole (PROTONIX) IV  40 mg Intravenous Q24H  . sertraline  50 mg Oral Daily  . vecuronium  10 mg Intravenous Once   Infusions:  . fentaNYL infusion INTRAVENOUS 300 mcg/hr (02/22/18 1552)  . heparin 800 Units/hr (02/22/18 1317)  . norepinephrine  (LEVOPHED) Adult infusion 13 mcg/min (02/22/18 1746)  . phenylephrine (NEO-SYNEPHRINE) Adult infusion    . piperacillin-tazobactam (ZOSYN)  IV 3.375 g (02/22/18 1952)  . pureflow 3 each (02/22/18 0925)  .  sodium bicarbonate  infusion 1000 mL 50 mL/hr at 02/22/18 1234  . vasopressin (PITRESSIN) infusion - *FOR SHOCK* Stopped (02/22/18 0928)   PRN: fentaNYL, hydrALAZINE, midazolam, ondansetron **OR** ondansetron (ZOFRAN) IV Anti-infectives (From admission, onward)   Start     Dose/Rate Route Frequency Ordered Stop   02/22/18 1200  piperacillin-tazobactam (ZOSYN) IVPB 3.375 g     3.375 g 12.5 mL/hr over 240 Minutes Intravenous Every 8 hours 02/22/18 1110     02/21/18 2100  piperacillin-tazobactam (ZOSYN) IVPB 3.375 g  Status:  Discontinued     3.375 g 100 mL/hr over 30 Minutes Intravenous Every 6 hours 02/21/18 1749 02/22/18 1110   02/21/18 1200  vancomycin (VANCOCIN) 1,250 mg in sodium chloride 0.9 % 250 mL IVPB  Status:  Discontinued     1,250 mg 166.7 mL/hr over 90 Minutes Intravenous Every 24 hours 02/21/18 0617 02/21/18 0624   02/21/18 1200  vancomycin (VANCOCIN) 1,500 mg in sodium chloride 0.9 % 500 mL IVPB  Status:  Discontinued     1,500 mg 250 mL/hr over 120 Minutes Intravenous Every 24 hours 02/21/18 0624 02/22/18 1104   02/21/18 0430  piperacillin-tazobactam (ZOSYN) IVPB 3.375 g  Status:  Discontinued     3.375 g 12.5 mL/hr over 240 Minutes Intravenous Every 8 hours 02/21/18 0419 02/21/18 1749   02/21/18 0430  vancomycin (VANCOCIN) IVPB 1000 mg/200 mL premix     1,000 mg 200 mL/hr over 60 Minutes Intravenous  Once 02/21/18 0419 02/21/18 0650   02/21/18 0400  Ampicillin-Sulbactam (UNASYN) 3 g in sodium chloride 0.9 % 100 mL IVPB  Status:  Discontinued     3 g 200 mL/hr over 30 Minutes Intravenous STAT 02/21/18 0350 02/21/18 0517      Assessment: 57 year old male with PE requiring anticoagulation via heparin gtt for PE, per pharmacy protocol.  Goal of Therapy:  Heparin  level 0.3-0.7 units/ml Monitor platelets by anticoagulation protocol: Yes   Plan:  03/04 @ 0330 HL 1.04 supratherapeutic. Will hold heparin level for 1 hour and will restart @ 0430 @ 1100 units/hr and will recheck HL @ 1100. CBC check w/ am labs.  3/4 @ 1035 HL 1.09 supratherapeutic. Will hold heparin level for 1 hour and will restart at 1300 @ 800 units/hr and will recheck HL @ 1900.   3/4 @1925  HL therapeutic at 0.48. Will continue current rate of heparin 800 units/hr and recheck HL in 6 hours.    Pernell Dupre, PharmD, BCPS Clinical Pharmacist 02/22/2018 7:57 PM

## 2018-02-22 NOTE — Progress Notes (Signed)
ANTICOAGULATION CONSULT NOTE - Initial Consult  Pharmacy Consult for heparin gtt Indication: pulmonary embolus  Allergies  Allergen Reactions  . Hydrocodone Rash  . Vicodin [Hydrocodone-Acetaminophen] Hives and Rash    Severe headaches (also)    Patient Measurements: Weight: 251 lb 12.3 oz (114.2 kg) Heparin Dosing Weight: 104kg  Vital Signs: Temp: 97.8 F (36.6 C) (03/04 0800) Temp Source: Axillary (03/04 0800) BP: 126/59 (03/04 0800) Pulse Rate: 64 (03/04 0800)  Labs: Recent Labs    02/21/18 0252 02/21/18 0543 02/21/18 1209 02/21/18 1600 02/21/18 1720  02/22/18 0041 02/22/18 0048 02/22/18 0452 02/22/18 0817 02/22/18 1035  HGB 13.5 14.6  --   --   --   --   --   --  13.1  --   --   HCT 42.8 46.2  --   --   --   --   --   --  40.7  --   --   PLT 193 153  --   --   --   --   --   --  104*  --   --   APTT <24*  --   --   --   --   --   --   --   --   --   --   LABPROT 12.9  --   --   --   --   --   --   --   --   --   --   INR 0.98  --   --   --   --   --   --   --   --   --   --   HEPARINUNFRC  --   --   --  1.27*  --   --  1.04*  --   --   --  1.09*  CREATININE 2.51* 2.14*  --   --  2.65*  2.69*   < >  --  1.78* 1.73* 1.63*  --   TROPONINI <0.03 0.03* 0.06*  --  0.07*  --   --   --   --   --   --    < > = values in this interval not displayed.    Estimated Creatinine Clearance: 68 mL/min (A) (by C-G formula based on SCr of 1.63 mg/dL (H)).   Medical History: Past Medical History:  Diagnosis Date  . Depression   . DVT (deep venous thrombosis) ([place])   . GERD (gastroesophageal reflux disease)   . Hyperlipidemia   . Hypertension   . Lupus   . Pulmonary embolism ([place])   . Renal disorder    Stage III    Medications:  Medications Prior to Admission  Medication Sig Dispense Refill Last Dose  . amLODipine (NORVASC) 5 MG tablet Take 2.5 mg by mouth at bedtime.   Unknown at Unknown  . benzonatate (TESSALON) 200 MG capsule Take 1 capsule (200 mg total) by  mouth 2 (two) times daily as needed for cough. 20 capsule 0 prn at prn  . doxycycline (VIBRA-TABS) 100 MG tablet Take 100 mg by mouth 2 (two) times daily.   Unknown at Unknown  . losartan (COZAAR) 100 MG tablet Take 100 mg by mouth daily.  6 Unknown at Unknown  . mometasone (NASONEX) 50 MCG/ACT nasal spray Place 2 sprays into the nose daily. 17 g 0 Unknown at Unknown  . mycophenolate (CELLCEPT) 500 MG tablet Take 1,000 mg by mouth 2 (two) times daily.  Unknown at Unknown  . omeprazole (PRILOSEC OTC) 20 MG tablet Take 20 mg by mouth daily.   Unknown at Unknown  . PARoxetine (PAXIL) 20 MG tablet Take 1 tablet (20 mg total) by mouth daily. 90 tablet 0 Unknown at Unknown  . sertraline (ZOLOFT) 50 MG tablet Take 50 mg by mouth daily.   Unknown at Unknown  . apixaban (ELIQUIS) 5 MG TABS tablet Take 5 mg by mouth once a day (Patient not taking: Reported on 02/21/2018) 5 tablet 0 Completed Course at Unknown time  . diphenhydrAMINE (BENADRYL) 25 mg capsule Take 1 capsule (25 mg total) by mouth every 8 (eight) hours as needed. (Patient not taking: Reported on 10/18/2017) 10 capsule 0 Not Taking  . loperamide (IMODIUM A-D) 2 MG tablet Take 1 tablet (2 mg total) 4 (four) times daily as needed by mouth for diarrhea or loose stools. (Patient not taking: Reported on 12/31/2017) 30 tablet 0 Not Taking  . moxifloxacin (AVELOX) 400 MG tablet Take 1 tablet (400 mg total) by mouth daily. (Patient not taking: Reported on 02/21/2018) 7 tablet 0 Completed Course at Unknown time  . Multiple Vitamins-Minerals (MULTIVITAMIN WITH MINERALS) tablet Take 1 tablet by mouth daily.   Not Taking at Unknown time  . mupirocin ointment (BACTROBAN) 2 % Place 1 application into the nose 2 (two) times daily. (Patient not taking: Reported on 10/18/2017) 22 g 0 Not Taking  . ondansetron (ZOFRAN ODT) 4 MG disintegrating tablet Take 1 tablet (4 mg total) every 8 (eight) hours as needed by mouth for nausea or vomiting. (Patient not taking: Reported  on 12/31/2017) 20 tablet 0 Not Taking at Unknown time  . pantoprazole (PROTONIX) 40 MG tablet TAKE 1 TABLET BY MOUTH EVERY DAY (Patient not taking: Reported on 10/18/2017) 30 tablet 0 Not Taking at Unknown time  . prednisoLONE 5 MG TABS tablet Take 10 mg by mouth daily.   Not Taking at Unknown time  . predniSONE (DELTASONE) 10 MG tablet Take 1 tablet by mouth daily.   Not Taking at Unknown time  . promethazine (PHENERGAN) 50 MG tablet Take 0.5 tablets (25 mg total) by mouth every 6 (six) hours as needed for nausea or vomiting. (Patient not taking: Reported on 10/18/2017) 30 tablet 0 Not Taking at Unknown time  . rosuvastatin (CRESTOR) 10 MG tablet Take 1 tablet (10 mg total) by mouth at bedtime. (Patient not taking: Reported on 10/18/2017) 90 tablet 0 Not Taking at Unknown time  . traMADol (ULTRAM) 50 MG tablet Take 2 tablets (100 mg total) by mouth every 6 (six) hours as needed for moderate pain. (Patient not taking: Reported on 10/18/2017) 30 tablet 0 Not Taking at Unknown time   Scheduled:  . budesonide (PULMICORT) nebulizer solution  0.5 mg Nebulization BID  . chlorhexidine gluconate (MEDLINE KIT)  15 mL Mouth Rinse BID  . feeding supplement (PRO-STAT SUGAR FREE 64)  60 mL Per Tube TID  . feeding supplement (VITAL HIGH PROTEIN)  1,000 mL Per Tube Q24H  . furosemide  40 mg Intravenous Q12H  . insulin aspart  0-15 Units Subcutaneous Q4H  . ipratropium-albuterol  3 mL Nebulization Q4H  . mouth rinse  15 mL Mouth Rinse 10 times per day  . methylPREDNISolone (SOLU-MEDROL) injection  40 mg Intravenous Q12H  . multivitamin  15 mL Per Tube Daily  . pantoprazole (PROTONIX) IV  40 mg Intravenous Q24H  . sertraline  50 mg Oral Daily  . vecuronium  10 mg Intravenous Once   Infusions:  .  fentaNYL infusion INTRAVENOUS 300 mcg/hr (02/22/18 0749)  . heparin 1,100 Units/hr (02/22/18 0630)  . norepinephrine (LEVOPHED) Adult infusion 12 mcg/min (02/22/18 0927)  . phenylephrine (NEO-SYNEPHRINE) Adult  infusion    . piperacillin-tazobactam (ZOSYN)  IV 3.375 g (02/22/18 1113)  . pureflow 3 each (02/22/18 0925)  .  sodium bicarbonate  infusion 1000 mL 50 mL/hr at 02/22/18 1100  . vasopressin (PITRESSIN) infusion - *FOR SHOCK* Stopped (02/22/18 0928)   PRN: fentaNYL, heparin, hydrALAZINE, midazolam, ondansetron **OR** ondansetron (ZOFRAN) IV Anti-infectives (From admission, onward)   Start     Dose/Rate Route Frequency Ordered Stop   02/22/18 1200  piperacillin-tazobactam (ZOSYN) IVPB 3.375 g     3.375 g 12.5 mL/hr over 240 Minutes Intravenous Every 8 hours 02/22/18 1110     02/21/18 2100  piperacillin-tazobactam (ZOSYN) IVPB 3.375 g  Status:  Discontinued     3.375 g 100 mL/hr over 30 Minutes Intravenous Every 6 hours 02/21/18 1749 02/22/18 1110   02/21/18 1200  vancomycin (VANCOCIN) 1,250 mg in sodium chloride 0.9 % 250 mL IVPB  Status:  Discontinued     1,250 mg 166.7 mL/hr over 90 Minutes Intravenous Every 24 hours 02/21/18 0617 02/21/18 0624   02/21/18 1200  vancomycin (VANCOCIN) 1,500 mg in sodium chloride 0.9 % 500 mL IVPB  Status:  Discontinued     1,500 mg 250 mL/hr over 120 Minutes Intravenous Every 24 hours 02/21/18 0624 02/22/18 1104   02/21/18 0430  piperacillin-tazobactam (ZOSYN) IVPB 3.375 g  Status:  Discontinued     3.375 g 12.5 mL/hr over 240 Minutes Intravenous Every 8 hours 02/21/18 0419 02/21/18 1749   02/21/18 0430  vancomycin (VANCOCIN) IVPB 1000 mg/200 mL premix     1,000 mg 200 mL/hr over 60 Minutes Intravenous  Once 02/21/18 0419 02/21/18 0650   02/21/18 0400  Ampicillin-Sulbactam (UNASYN) 3 g in sodium chloride 0.9 % 100 mL IVPB  Status:  Discontinued     3 g 200 mL/hr over 30 Minutes Intravenous STAT 02/21/18 0350 02/21/18 0517      Assessment: 57 year old male with PE requiring anticoagulation via heparin gtt for PE, per pharmacy protocol.  Goal of Therapy:  Heparin level 0.3-0.7 units/ml Monitor platelets by anticoagulation protocol: Yes   Plan:   Give 4000 units bolus x 1 Start heparin infusion at 1600 units/hr Check anti-Xa level in 6 hours and daily while on heparin Continue to monitor H&H and platelets  3/3_0 , HL 1.27. Hold heparin gtt 1 hour, also will decrease heparin gtt from iv 1600units/hr to 1300units/hr. Will rechech HL in 6 hours per protocol.  03/04 @ 0330 HL 1.04 supratherapeutic. Will hold heparin level for 1 hour and will restart @ 0430 @ 1100 units/hr and will recheck HL @ 1100. CBC check w/ am labs.  3/4 @ 1035 HL 1.09 supratherapeutic. Will hold heparin level for 1 hour and will restart at 1300 @ 800 units/hr and will recheck HL @ 1900.    Candelaria Stagers, PharmD  Pharmacy Resident  02/22/2018

## 2018-02-22 NOTE — Telephone Encounter (Signed)
Copied from [place]. Topic: General - Other >> Feb 22, 2018  3:43 PM Jonathon Snow, NT wrote: Reason for CRM: Patients daughter Jonathon Snow Getting  called and said he is inpatient  in the ICU , at [facility] , he is being treated by Dr Rich Number  he has told the familythat  the lupus is shutting down his organs it has started with his kidney,he is currently on dialysis he is putting out a little urine on his own .please call her at [phone] He also has PNA in his right lung  ,

## 2018-02-22 NOTE — Progress Notes (Signed)
Patient agitated, Dr. Mortimer Fries gave verbal order for ativan. Order placed. Wilnette Kales

## 2018-02-22 NOTE — Progress Notes (Signed)
ANTICOAGULATION CONSULT NOTE - Initial Consult  Pharmacy Consult for heparin gtt Indication: pulmonary embolus  Allergies  Allergen Reactions  . Hydrocodone Rash  . Vicodin [Hydrocodone-Acetaminophen] Hives and Rash    Severe headaches (also)    Patient Measurements: Weight: 245 lb 9.5 oz (111.4 kg) Heparin Dosing Weight: 104kg  Vital Signs: Temp: 99 F (37.2 C) (03/04 0000) Temp Source: Axillary (03/04 0000) BP: 144/90 (03/04 0315) Pulse Rate: 68 (03/04 0315)  Labs: Recent Labs    02/21/18 0252 02/21/18 0543 02/21/18 1209 02/21/18 1600 02/21/18 1720 02/21/18 2150 02/22/18 0041 02/22/18 0048  HGB 13.5 14.6  --   --   --   --   --   --   HCT 42.8 46.2  --   --   --   --   --   --   PLT 193 153  --   --   --   --   --   --   APTT <24*  --   --   --   --   --   --   --   LABPROT 12.9  --   --   --   --   --   --   --   INR 0.98  --   --   --   --   --   --   --   HEPARINUNFRC  --   --   --  1.27*  --   --  1.04*  --   CREATININE 2.51* 2.14*  --   --  2.65*  2.69* 2.20*  --  1.78*  TROPONINI <0.03 0.03* 0.06*  --  0.07*  --   --   --     Estimated Creatinine Clearance: 61.5 mL/min (A) (by C-G formula based on SCr of 1.78 mg/dL (H)).   Medical History: Past Medical History:  Diagnosis Date  . Depression   . DVT (deep venous thrombosis) ([facility])   . GERD (gastroesophageal reflux disease)   . Hyperlipidemia   . Hypertension   . Lupus   . Pulmonary embolism ([facility])   . Renal disorder    Stage III    Medications:  Medications Prior to Admission  Medication Sig Dispense Refill Last Dose  . amLODipine (NORVASC) 5 MG tablet Take 2.5 mg by mouth at bedtime.   Unknown at Unknown  . benzonatate (TESSALON) 200 MG capsule Take 1 capsule (200 mg total) by mouth 2 (two) times daily as needed for cough. 20 capsule 0 prn at prn  . doxycycline (VIBRA-TABS) 100 MG tablet Take 100 mg by mouth 2 (two) times daily.   Unknown at Unknown  . losartan (COZAAR) 100 MG tablet Take  100 mg by mouth daily.  6 Unknown at Unknown  . mometasone (NASONEX) 50 MCG/ACT nasal spray Place 2 sprays into the nose daily. 17 g 0 Unknown at Unknown  . mycophenolate (CELLCEPT) 500 MG tablet Take 1,000 mg by mouth 2 (two) times daily.   Unknown at Unknown  . omeprazole (PRILOSEC OTC) 20 MG tablet Take 20 mg by mouth daily.   Unknown at Unknown  . PARoxetine (PAXIL) 20 MG tablet Take 1 tablet (20 mg total) by mouth daily. 90 tablet 0 Unknown at Unknown  . sertraline (ZOLOFT) 50 MG tablet Take 50 mg by mouth daily.   Unknown at Unknown  . apixaban (ELIQUIS) 5 MG TABS tablet Take 5 mg by mouth once a day (Patient not taking: Reported on 02/21/2018) 5 tablet   0 Completed Course at Unknown time  . diphenhydrAMINE (BENADRYL) 25 mg capsule Take 1 capsule (25 mg total) by mouth every 8 (eight) hours as needed. (Patient not taking: Reported on 10/18/2017) 10 capsule 0 Not Taking  . loperamide (IMODIUM A-D) 2 MG tablet Take 1 tablet (2 mg total) 4 (four) times daily as needed by mouth for diarrhea or loose stools. (Patient not taking: Reported on 12/31/2017) 30 tablet 0 Not Taking  . moxifloxacin (AVELOX) 400 MG tablet Take 1 tablet (400 mg total) by mouth daily. (Patient not taking: Reported on 02/21/2018) 7 tablet 0 Completed Course at Unknown time  . Multiple Vitamins-Minerals (MULTIVITAMIN WITH MINERALS) tablet Take 1 tablet by mouth daily.   Not Taking at Unknown time  . mupirocin ointment (BACTROBAN) 2 % Place 1 application into the nose 2 (two) times daily. (Patient not taking: Reported on 10/18/2017) 22 g 0 Not Taking  . ondansetron (ZOFRAN ODT) 4 MG disintegrating tablet Take 1 tablet (4 mg total) every 8 (eight) hours as needed by mouth for nausea or vomiting. (Patient not taking: Reported on 12/31/2017) 20 tablet 0 Not Taking at Unknown time  . pantoprazole (PROTONIX) 40 MG tablet TAKE 1 TABLET BY MOUTH EVERY DAY (Patient not taking: Reported on 10/18/2017) 30 tablet 0 Not Taking at Unknown time  .  prednisoLONE 5 MG TABS tablet Take 10 mg by mouth daily.   Not Taking at Unknown time  . predniSONE (DELTASONE) 10 MG tablet Take 1 tablet by mouth daily.   Not Taking at Unknown time  . promethazine (PHENERGAN) 50 MG tablet Take 0.5 tablets (25 mg total) by mouth every 6 (six) hours as needed for nausea or vomiting. (Patient not taking: Reported on 10/18/2017) 30 tablet 0 Not Taking at Unknown time  . rosuvastatin (CRESTOR) 10 MG tablet Take 1 tablet (10 mg total) by mouth at bedtime. (Patient not taking: Reported on 10/18/2017) 90 tablet 0 Not Taking at Unknown time  . traMADol (ULTRAM) 50 MG tablet Take 2 tablets (100 mg total) by mouth every 6 (six) hours as needed for moderate pain. (Patient not taking: Reported on 10/18/2017) 30 tablet 0 Not Taking at Unknown time   Scheduled:  . chlorhexidine gluconate (MEDLINE KIT)  15 mL Mouth Rinse BID  . feeding supplement (PRO-STAT SUGAR FREE 64)  60 mL Per Tube TID  . feeding supplement (VITAL HIGH PROTEIN)  1,000 mL Per Tube Q24H  . furosemide  40 mg Intravenous Q12H  . insulin aspart  0-15 Units Subcutaneous Q4H  . mouth rinse  15 mL Mouth Rinse 10 times per day  . methylPREDNISolone (SOLU-MEDROL) injection  40 mg Intravenous Q4H  . multivitamin  15 mL Per Tube Daily  . sertraline  50 mg Oral Daily  . vecuronium  10 mg Intravenous Once   Infusions:  . fentaNYL infusion INTRAVENOUS 275 mcg/hr (02/22/18 0200)  . heparin 1,300 Units/hr (02/22/18 0244)  . magnesium sulfate 1 - 4 g bolus IVPB    . norepinephrine (LEVOPHED) Adult infusion 16 mcg/min (02/22/18 0200)  . phenylephrine (NEO-SYNEPHRINE) Adult infusion    . piperacillin-tazobactam Stopped (02/21/18 2150)  . pureflow 2,000 mL/hr at 02/22/18 0233  .  sodium bicarbonate  infusion 1000 mL 150 mL/hr at 02/22/18 0200  . vancomycin Stopped (02/21/18 1400)  . vasopressin (PITRESSIN) infusion - *FOR SHOCK* 0.03 Units/min (02/22/18 0200)   PRN: fentaNYL, heparin, hydrALAZINE, midazolam,  ondansetron **OR** ondansetron (ZOFRAN) IV Anti-infectives (From admission, onward)   Start     Dose/Rate   Route Frequency Ordered Stop   02/21/18 2100  piperacillin-tazobactam (ZOSYN) IVPB 3.375 g     3.375 g 100 mL/hr over 30 Minutes Intravenous Every 6 hours 02/21/18 1749     02/21/18 1200  vancomycin (VANCOCIN) 1,250 mg in sodium chloride 0.9 % 250 mL IVPB  Status:  Discontinued     1,250 mg 166.7 mL/hr over 90 Minutes Intravenous Every 24 hours 02/21/18 0617 02/21/18 0624   02/21/18 1200  vancomycin (VANCOCIN) 1,500 mg in sodium chloride 0.9 % 500 mL IVPB     1,500 mg 250 mL/hr over 120 Minutes Intravenous Every 24 hours 02/21/18 0624     02/21/18 0430  piperacillin-tazobactam (ZOSYN) IVPB 3.375 g  Status:  Discontinued     3.375 g 12.5 mL/hr over 240 Minutes Intravenous Every 8 hours 02/21/18 0419 02/21/18 1749   02/21/18 0430  vancomycin (VANCOCIN) IVPB 1000 mg/200 mL premix     1,000 mg 200 mL/hr over 60 Minutes Intravenous  Once 02/21/18 0419 02/21/18 0650   02/21/18 0400  Ampicillin-Sulbactam (UNASYN) 3 g in sodium chloride 0.9 % 100 mL IVPB  Status:  Discontinued     3 g 200 mL/hr over 30 Minutes Intravenous STAT 02/21/18 0350 02/21/18 0517      Assessment: 56 year old male with PE requiring anticoagulation via heparin gtt for PE, per pharmacy protocol.  Goal of Therapy:  Heparin level 0.3-0.7 units/ml Monitor platelets by anticoagulation protocol: Yes   Plan:  Give 4000 units bolus x 1 Start heparin infusion at 1600 units/hr Check anti-Xa level in 6 hours and daily while on heparin Continue to monitor H&H and platelets  3/3@1830, HL 1.27. Hold heparin gtt 1 hour, also will decrease heparin gtt from iv 1600units/hr to 1300units/hr. Will rechech HL in 6 hours per protocol.  03/04 @ 0330 HL 1.04 supratherapeutic. Will hold heparin level for 1 hour and will restart @ 0430 @ 1100 units/hr and will recheck HL @ 1100. CBC check w/ am labs.    , PharmD,  BCPS Clinical Pharmacist 02/22/2018  

## 2018-02-22 NOTE — Progress Notes (Signed)
[facility] NAME: Jonathon Snow    MR#:  [id number]  DATE OF BIRTH:  07/01/1961  SUBJECTIVE:  CHIEF COMPLAINT:   Chief Complaint  Patient presents with  . Unresponsive  Discussed with intensivist, continue ventilator with weaning as tolerated  REVIEW OF SYSTEMS:  CONSTITUTIONAL: No fever, fatigue or weakness.  EYES: No blurred or double vision.  EARS, NOSE, AND THROAT: No tinnitus or ear pain.  RESPIRATORY: No cough, shortness of breath, wheezing or hemoptysis.  CARDIOVASCULAR: No chest pain, orthopnea, edema.  GASTROINTESTINAL: No nausea, vomiting, diarrhea or abdominal pain.  GENITOURINARY: No dysuria, hematuria.  ENDOCRINE: No polyuria, nocturia,  HEMATOLOGY: No anemia, easy bruising or bleeding SKIN: No rash or lesion. MUSCULOSKELETAL: No joint pain or arthritis.   NEUROLOGIC: No tingling, numbness, weakness.  PSYCHIATRY: No anxiety or depression.   ROS  DRUG ALLERGIES:   Allergies  Allergen Reactions  . Hydrocodone Rash  . Vicodin [Hydrocodone-Acetaminophen] Hives and Rash    Severe headaches (also)    VITALS:  Blood pressure (!) 109/47, pulse (!) 57, temperature (!) 96.7 F (35.9 C), temperature source Axillary, resp. rate 19, weight 114.2 kg (251 lb 12.3 oz), SpO2 95 %.  PHYSICAL EXAMINATION:  GENERAL:  57 y.o.-year-old patient lying in the bed with no acute distress.  EYES: Pupils equal, round, reactive to light and accommodation. No scleral icterus. Extraocular muscles intact.  HEENT: Head atraumatic, normocephalic. Oropharynx and nasopharynx clear.  NECK:  Supple, no jugular venous distention. No thyroid enlargement, no tenderness.  LUNGS: Normal breath sounds bilaterally, no wheezing, rales,rhonchi or crepitation. No use of accessory muscles of respiration.  CARDIOVASCULAR: S1, S2 normal. No murmurs, rubs, or gallops.  ABDOMEN: Soft, nontender, nondistended. Bowel sounds present. No organomegaly or mass.   EXTREMITIES: No pedal edema, cyanosis, or clubbing.  NEUROLOGIC: Cranial nerves II through XII are intact. Muscle strength 5/5 in all extremities. Sensation intact. Gait not checked.  PSYCHIATRIC: The patient is alert and oriented x 3.  SKIN: No obvious rash, lesion, or ulcer.   Physical Exam LABORATORY PANEL:   CBC Recent Labs  Lab 02/22/18 0452  WBC 15.5*  HGB 13.1  HCT 40.7  PLT 104*   ------------------------------------------------------------------------------------------------------------------  Chemistries  Recent Labs  Lab 02/21/18 0543  02/22/18 0817 02/22/18 1152  NA 140   < > 139 139  K 4.2   < > 4.1 4.0  CL 110   < > 103 100*  CO2 20*   < > 27 29  GLUCOSE 147*   < > 215* 174*  BUN 31*   < > 27* 25*  CREATININE 2.14*   < > 1.63* 1.56*  CALCIUM 7.8*   < > 7.5* 7.9*  MG  --    < > 2.2  --   AST 29  --   --   --   ALT 21  --   --   --   ALKPHOS 81  --   --   --   BILITOT 0.4  --   --   --    < > = values in this interval not displayed.   ------------------------------------------------------------------------------------------------------------------  Cardiac Enzymes Recent Labs  Lab 02/21/18 1209 02/21/18 1720  TROPONINI 0.06* 0.07*   ------------------------------------------------------------------------------------------------------------------  RADIOLOGY:  Dg Abdomen 1 View  Result Date: 02/21/2018 CLINICAL DATA:  Orogastric tube placement. EXAM: ABDOMEN - 1 VIEW COMPARISON:  CT 11/06/2017 FINDINGS: Tip and side port of the enteric tube below the diaphragm  in the stomach. No bowel dilatation to suggest obstruction. IVC filter slightly tilted to the left, unchanged from prior CT. No evidence of free air. IMPRESSION: Tip and side port of the enteric tube below the diaphragm in the stomach. Electronically Signed   By: Jeb Levering M.D.   On: 02/21/2018 04:18   Ct Head Wo Contrast  Result Date: 02/21/2018 CLINICAL DATA:  57 y/o  M; found  unconscious. EXAM: CT HEAD WITHOUT CONTRAST TECHNIQUE: Contiguous axial images were obtained from the base of the skull through the vertex without intravenous contrast. COMPARISON:  12/10/2013 CT head. FINDINGS: Brain: No evidence of acute infarction, hemorrhage, hydrocephalus, extra-axial collection or mass lesion/mass effect. Vascular: No hyperdense vessel. Calcific atherosclerosis of carotid siphons. Skull: Normal. Negative for fracture or focal lesion. Sinuses/Orbits: Multiple paranasal sinus fluid levels. Normal aeration of mastoid air cells. Orbits incompletely included within field of view, no traumatic finding identified. Other: None. IMPRESSION: 1. No acute intracranial abnormality or calvarial fracture. 2. Multiple sinus fluid levels may represent acute sinusitis or facial trauma, clinical correlation recommended. Electronically Signed   By: Kristine Garbe M.D.   On: 02/21/2018 04:45   Dg Chest Port 1 View  Result Date: 02/21/2018 CLINICAL DATA:  57 year old male status post central line placement. Respiratory failure, intubated. EXAM: PORTABLE CHEST 1 VIEW COMPARISON:  0311 hours today and earlier. FINDINGS: Portable AP semi upright view at 0944 hours. A right IJ central line now is in place. The catheter tip projects at the cavoatrial junction level. An enteric tube has been placed, courses to the abdomen, tip not included. Endotracheal tube tip is stable up the level the clavicles. More kyphotic positioning now. No pneumothorax identified. Veiling opacity in the right lung. Increased retrocardiac opacity right greater than left. Stable left lung aside from left retrocardiac opacity. Pulmonary vascularity appears stable. IMPRESSION: 1. Right IJ central line placed, tip at the cavoatrial junction level. 2. Enteric tube placed, courses to the abdomen tip not included. 3. No pneumothorax, but increasing veiling and confluent opacity in the right lung suggesting right pleural effusion plus new  right lower lobe collapse or consolidation. Electronically Signed   By: Genevie Ann M.D.   On: 02/21/2018 10:04   Dg Chest Portable 1 View  Result Date: 02/21/2018 CLINICAL DATA:  Respiratory failure.  Endotracheal tube placement. EXAM: PORTABLE CHEST 1 VIEW COMPARISON:  Most recent chest imaging CT 04/27/2017 FINDINGS: Endotracheal tube tip 3.2 cm from the carina. Low lung volumes. Borderline cardiomegaly. Ill-defined bilateral perihilar density. More diffuse right lung opacities most prominent in the suprahilar lung. Possible small right pleural effusion. No pneumothorax. IMPRESSION: 1. Endotracheal tube tip 3.2 cm from the carina. 2. Ill-defined bilateral perihilar opacities likely pulmonary edema. 3. Patchy opacities throughout the right lung may be pneumonia, asymmetric pulmonary edema or aspiration. Electronically Signed   By: Jeb Levering M.D.   On: 02/21/2018 03:30    ASSESSMENT AND PLAN:  57 year old male patient with history of DVT, pulmonary embolism, CKD stage III, lupus erythematosus presented to the emergency room with decreased responsiveness.  Admitting diagnosis 1.Acute hypoxic respiratory failure  Stable  Secondary to pneumonia In discussion with intensivist, continue ventilator with weaning as tolerated   2. Acute encephalopathy  altered mental status Most likely secondary to pneumonia with associated acute hypoxic respiratory failure  3.Acute aspiration pneumonia Stable Continue empiric vancomycin/Zosyn, pneumonia protocol  4. Hypotension Secondary to sepsis from acute pneumonia Continue pressors with weaning as tolerated, antibiotics per above, sepsis protocol  5.ARF/AKI w/  CKD III Improved  Secondary to ATN from acute illness  Treated with CRRT-discontinued on March 25, 2018   6.Lupus erythematosus Stable  7.History of DVT and pulmonary embolism On heparin drip, Eliquis on hold  All the records are reviewed and case discussed with Care  Management/Social Workerr. Management plans discussed with the patient, family and they are in agreement.  CODE STATUS: full TOTAL TIME TAKING CARE OF THIS PATIENT: 35 minutes.     POSSIBLE D/C IN 3-5 DAYS, DEPENDING ON CLINICAL CONDITION.   Avel Peace Raynaldo Falco M.D on 02/22/2018   Between 7am to 6pm - Pager - [phone]  After 6pm go to [email or web address] - password [email or web address] [facility]  Office  [phone]  CC: Primary care physician; Arnetha Courser, MD  Note: This dictation was prepared with Dragon dictation along with smaller phrase technology. Any transcriptional errors that result from this process are unintentional.

## 2018-02-22 NOTE — Progress Notes (Signed)
   02/22/18 1400  Clinical Encounter Type  Visited With Patient not available  Visit Type Follow-up   Chaplain went to follow up with patient and family; family not present, patient sleeping.

## 2018-02-22 NOTE — Progress Notes (Signed)
CRRT stopped per Dr. Holley Raring. vascath heparin locked. Jonathon Snow

## 2018-02-22 NOTE — Plan of Care (Signed)
Patient remains intubated.  PRN Versed given during shift for bouts of anxiety and fighting tube.  Patient alert and able to follow commands.  Able to redirect.  No acute distress noted.  Patient remains on CRRT.  Urine output of 670 ccs this shift.  Afebrile.  Will continue to monitor.

## 2018-02-22 NOTE — Progress Notes (Signed)
Pharmacy Antibiotic Note  Jonathon Snow is a 57 y.o. male admitted on 02/21/2018 with acute respiratory failure after being found unresponsive.  Pharmacy has been consulted for Zosyn dosing. Patient has a h/o CKD and is in acute renal failure.  Nephrology transitioning patient off CRRT due to improvement in UOP.   Plan: Will decrease dosing of Zosyn to 3.375 g EI q 12 hours and continue to follow SCr.   Weight: 251 lb 12.3 oz (114.2 kg)  Temp (24hrs), Avg:98.1 F (36.7 C), Min:96.7 F (35.9 C), Max:99 F (37.2 C)  Recent Labs  Lab 02/21/18 0252 02/21/18 0318 02/21/18 0543  02/21/18 2150 02/22/18 0048 02/22/18 0249 02/22/18 0452 02/22/18 0817 02/22/18 1152  WBC 8.6  --  3.9  --   --   --   --  15.5*  --   --   CREATININE 2.51*  --  2.14*   < > 2.20* 1.78*  --  1.73* 1.63* 1.56*  LATICACIDVEN  --  0.4*  --   --   --   --  2.2*  --   --   --    < > = values in this interval not displayed.    Estimated Creatinine Clearance: 71 mL/min (A) (by C-G formula based on SCr of 1.56 mg/dL (H)).    Allergies  Allergen Reactions  . Hydrocodone Rash  . Vicodin [Hydrocodone-Acetaminophen] Hives and Rash    Severe headaches (also)    Antimicrobials this admission: Zosyn 3/3 >>  vancomycin 3/3 >> 3/4  Dose adjustments this admission: Zosyn 3.375 g EI q 8 h >> q 12 h  Microbiology results: 3/3 MRSA PCR: negative  Thank you for allowing pharmacy to be a part of this patient's care.  Jonathon Snow 02/22/2018 1:40 PM

## 2018-02-22 NOTE — Progress Notes (Signed)
   02/22/18 1020  Clinical Encounter Type  Visited With Family  Visit Type Follow-up  Referral From Nurse  Consult/Referral To Chaplain  Spiritual Encounters  Spiritual Needs Emotional   Chaplain received page that patient daughter wanted to meet.  Chaplain and patient daughter began with daughter's concern as to whether or not patient was saved, what that means to her and to him.  Daughter spoke of family dynamics with brother/father (patient) and her role in the family and in father's health care.  During our conversation, patient's son joined [place].  Patient's children spoke of their relationships with patient, choices he made, and impact that had on the children and their family.  Patient's children also spoke of patient's significant other and network of people in patient's life that they (children) have concerns about.  Exploration of supports and resources available to patient children as they support patient.  Some conversation around advanced directives for patient.  Daughter is aware of patient's wishes and sees herself as an advocate for him, as she was for his mother at her death in 28-Mar-2016.

## 2018-02-22 NOTE — Telephone Encounter (Signed)
I left a detailed message that I am thinking of her and her father; sorry to have missed her; I'll be in the office all week; I'll follow along in his chart

## 2018-02-22 NOTE — Consult Note (Signed)
[facility] PATIENT NAME: Jonathon Snow   MR#:  [id number] DATE OF BIRTH:  May 17, 1961 DATE OF ADMISSION:  02/21/2018 PRIMARY CARE PHYSICIAN: Arnetha Courser, MD  REQUESTING/REFERRING PHYSICIAN: Malinda  CHIEF COMPLAINT:   Chief Complaint  Patient presents with  . Unresponsive  resp failure  EVENTS 3/3 intubated 3/3 Vasc cath and  CVL placed, CRRT started, bicarb started  HISTORY OF PRESENT ILLNESS: Patient remains intubated Sedated On full vent support On vasopressors Bicarb infusion CRRT started  Patient with severe resp failure fio2 at80% PEEP at 8    REVIEW OF SYSTEMS:  Could not be obtained as patient is on ventilator   VITAL SIGNS: Blood pressure (!) 144/90, pulse 68, temperature 99 F (37.2 C), temperature source Axillary, resp. rate 20, weight 251 lb 12.3 oz (114.2 kg), SpO2 97 %.  PHYSICAL EXAMINATION:  GENERAL:critically ill appearing, +resp distress HEAD: Normocephalic, atraumatic.  EYES: Pupils equal, round, reactive to light.  No scleral icterus.  MOUTH: Moist mucosal membrane. NECK: Supple. No thyromegaly. No nodules. No JVD.  PULMONARY: +rhonchi, +wheezing CARDIOVASCULAR: S1 and S2. Regular rate and rhythm. No murmurs, rubs, or gallops.  GASTROINTESTINAL: Soft, nontender, -distended. No masses. Positive bowel sounds. No hepatosplenomegaly.  MUSCULOSKELETAL: No swelling, clubbing, or edema.  NEUROLOGIC: obtunded SKIN:intact,warm,dry   LABORATORY PANEL:   CBC Recent Labs  Lab 02/21/18 0252 02/21/18 0543 02/22/18 0452  WBC 8.6 3.9 15.5*  HGB 13.5 14.6 13.1  HCT 42.8 46.2 40.7  PLT 193 153 104*  MCV 92.2 91.8 90.8  MCH 29.0 29.0 29.3  MCHC 31.4* 31.6* 32.3  RDW 15.3* 15.5* 15.0*  LYMPHSABS 1.6  --   --   MONOABS 0.2  --   --   EOSABS 0.1  --   --   BASOSABS 0.1  --   --    ------------------------------------------------------------------------------------------------------------------  Chemistries  Recent Labs   Lab 02/21/18 0252 02/21/18 0543 02/21/18 1720 02/21/18 2150 02/22/18 0048 02/22/18 0452  NA 139 140 139  138 140 140 138  K 4.7 4.2 5.6*  5.6* 5.4* 4.7 4.3  CL 105 110 111  112* 107 107 104  CO2 24 20* 17*  21* 23 25 25   GLUCOSE 310* 147* 142*  143* 208* 213* 189*  BUN 31* 31* 38*  37* 34* 32* 31*  CREATININE 2.51* 2.14* 2.65*  2.69* 2.20* 1.78* 1.73*  CALCIUM 8.4* 7.8* 7.8*  7.9* 7.5* 7.4* 7.4*  MG  --   --  1.7  1.6*  --  1.5* 2.8*  AST 30 29  --   --   --   --   ALT 18 21  --   --   --   --   ALKPHOS 79 81  --   --   --   --   BILITOT 0.6 0.4  --   --   --   --    ------------------------------------------------------------------------------------------------------------------ estimated creatinine clearance is 64.1 mL/min (A) (by C-G formula based on SCr of 1.73 mg/dL (H)). ------------------------------------------------------------------------------------------------------------------ No results for input(s): TSH, T4TOTAL, T3FREE, THYROIDAB in the last 72 hours.  Invalid input(s): FREET3   Coagulation profile Recent Labs  Lab 02/21/18 0252  INR 0.98   ------------------------------------------------------------------------------------------------------------------- No results for input(s): DDIMER in the last 72 hours. -------------------------------------------------------------------------------------------------------------------  Cardiac Enzymes Recent Labs  Lab 02/21/18 0543 02/21/18 1209 02/21/18 1720  TROPONINI 0.03* 0.06* 0.07*   ------------------------------------------------------------------------------------------------------------------ Invalid input(s): POCBNP  ---------------------------------------------------------------------------------------------------------------  Urinalysis    Component Value Date/Time   COLORURINE YELLOW (A)  02/21/2018 0318   APPEARANCEUR HAZY (A) 02/21/2018 0318   APPEARANCEUR Clear 05/21/2017 1046    LABSPEC 1.014 02/21/2018 0318   LABSPEC 1.013 04/10/2015 1036   PHURINE 5.0 02/21/2018 0318   GLUCOSEU NEGATIVE 02/21/2018 0318   GLUCOSEU Negative 04/10/2015 1036   HGBUR LARGE (A) 02/21/2018 0318   BILIRUBINUR NEGATIVE 02/21/2018 0318   BILIRUBINUR Negative 05/21/2017 1046   BILIRUBINUR Negative 04/10/2015 1036   KETONESUR NEGATIVE 02/21/2018 0318   PROTEINUR 100 (A) 02/21/2018 0318   UROBILINOGEN 0.2 03/17/2015 0025   NITRITE NEGATIVE 02/21/2018 0318   LEUKOCYTESUR NEGATIVE 02/21/2018 0318   LEUKOCYTESUR Trace (A) 05/21/2017 1046   LEUKOCYTESUR Negative 04/10/2015 1036     RADIOLOGY:  IMPRESSION AND PLAN: 57 year old male patient with history of DVT, pulmonary embolism, CKD stage III, lupus erythematosus presented to the emergency room with decreased responsiveness. Patient with severe resp failure ARDS/ALI with renal failure from pneumonia  1.Severe Respiratory Failure -continue Full MV support -continue Bronchodilator Therapy -Wean Fio2 and PEEP as tolerated IV vancomycin and IV Zosyn antibiotic  2.Renal Failure-most likely due to ATN -follow chem 7 -follow UO -continue Foley Catheter-assess need Started on CRRT On biCARB infusion  3.- intubated and sedated - minimal sedation to achieve a RASS goal: -1   4.h/o DVT/PE -Started heparin infusion   Critical Care Time devoted to patient care services described in this note is 45 minutes.   Overall, patient is critically ill, prognosis is guarded.  Patient with Multiorgan failure and at high risk for cardiac arrest and death.   Daughter at bedside updated and notified   Zeyad Delaguila Patricia Pesa, M.D.  [facility]  Medical Director [place] Director [facility] Cardio-P[facility] Department

## 2018-02-23 LAB — BASIC METABOLIC PANEL
Anion gap: 12 (ref 5–15)
BUN: 43 mg/dL — AB (ref 6–20)
CO2: 28 mmol/L (ref 22–32)
CREATININE: 2.39 mg/dL — AB (ref 0.61–1.24)
Calcium: 8 mg/dL — ABNORMAL LOW (ref 8.9–10.3)
Chloride: 99 mmol/L — ABNORMAL LOW (ref 101–111)
GFR, EST AFRICAN AMERICAN: 33 mL/min — AB (ref >60)
GFR, EST NON AFRICAN AMERICAN: 29 mL/min — AB (ref >60)
Glucose, Bld: 151 mg/dL — ABNORMAL HIGH (ref 65–99)
Potassium: 3.9 mmol/L (ref 3.5–5.1)
SODIUM: 139 mmol/L (ref 135–145)

## 2018-02-23 LAB — GLUCOSE, CAPILLARY
GLUCOSE-CAPILLARY: 151 mg/dL — AB (ref 65–99)
Glucose-Capillary: 135 mg/dL — ABNORMAL HIGH (ref 65–99)
Glucose-Capillary: 129 mg/dL — ABNORMAL HIGH (ref 65–99)
Glucose-Capillary: 141 mg/dL — ABNORMAL HIGH (ref 65–99)
Glucose-Capillary: 139 mg/dL — ABNORMAL HIGH (ref 65–99)
Glucose-Capillary: 132 mg/dL — ABNORMAL HIGH (ref 65–99)

## 2018-02-23 LAB — CBC
HCT: 38.9 % — ABNORMAL LOW (ref 40.0–52.0)
HEMOGLOBIN: 12.1 g/dL — AB (ref 13.0–18.0)
MCH: 28.2 pg (ref 26.0–34.0)
MCHC: 31.2 g/dL — AB (ref 32.0–36.0)
MCV: 90.4 fL (ref 80.0–100.0)
PLATELETS: 102 10*3/uL — AB (ref 150–440)
RBC: 4.3 MIL/uL — ABNORMAL LOW (ref 4.40–5.90)
RDW: 15.2 % — AB (ref 11.5–14.5)
WBC: 23.3 10*3/uL — ABNORMAL HIGH (ref 3.8–10.6)

## 2018-02-23 LAB — HEPARIN LEVEL (UNFRACTIONATED)
HEPARIN UNFRACTIONATED: 0.29 [IU]/mL — AB (ref 0.30–0.70)
HEPARIN UNFRACTIONATED: 0.2 [IU]/mL — AB (ref 0.30–0.70)
Heparin Unfractionated: 0.36 IU/mL (ref 0.30–0.70)

## 2018-02-23 LAB — MAGNESIUM: MAGNESIUM: 2.1 mg/dL (ref 1.7–2.4)

## 2018-02-23 MED ORDER — STERILE WATER FOR INJECTION IV SOLN
INTRAVENOUS | Status: DC
  Administered 2018-02-23 – 2018-02-25 (×4): via INTRAVENOUS
  Filled 2018-02-23 (×5): qty 850

## 2018-02-23 MED ORDER — HEPARIN BOLUS VIA INFUSION
1600.0000 [IU] | Freq: Once | INTRAVENOUS | Status: AC
  Administered 2018-02-23: 1600 [IU] via INTRAVENOUS
  Filled 2018-02-23: qty 1600

## 2018-02-23 MED ORDER — SODIUM CHLORIDE 0.9% FLUSH: 10.0000 mL | INTRAVENOUS | Status: DC | PRN

## 2018-02-23 MED ORDER — HEPARIN BOLUS VIA INFUSION
2000.0000 [IU] | Freq: Once | INTRAVENOUS | Status: AC
  Administered 2018-02-23: 2000 [IU] via INTRAVENOUS
  Filled 2018-02-23: qty 2000

## 2018-02-23 MED ORDER — SODIUM CHLORIDE 0.9% FLUSH
10.0000 mL | Freq: Two times a day (BID) | INTRAVENOUS | Status: DC
  Administered 2018-02-24 – 2018-02-26 (×5): 10 mL

## 2018-02-23 NOTE — Progress Notes (Addendum)
ANTICOAGULATION CONSULT NOTE - Initial Consult  Pharmacy Consult for heparin gtt Indication: pulmonary embolus  Allergies  Allergen Reactions  . Hydrocodone Rash  . Vicodin [Hydrocodone-Acetaminophen] Hives and Rash    Severe headaches (also)    Patient Measurements: Weight: 250 lb 14.1 oz (113.8 kg) Heparin Dosing Weight: 104kg  Vital Signs: Temp: 98.2 F (36.8 C) (03/05 1200) BP: 125/63 (03/05 1200) Pulse Rate: 72 (03/05 1200)  Labs: Recent Labs    02/21/18 0252 02/21/18 0543 02/21/18 1209  02/21/18 1720  02/22/18 0452 02/22/18 0817 02/22/18 1035 02/22/18 1152  02/23/18 0144 02/23/18 1000 02/23/18 1644  HGB 13.5 14.6  --   --   --   --  13.1  --   --   --   --  12.1*  --   --   HCT 42.8 46.2  --   --   --   --  40.7  --   --   --   --  38.9*  --   --   PLT 193 153  --   --   --   --  104*  --   --   --   --  102*  --   --   APTT <24*  --   --   --   --   --   --   --  >160*  --   --   --   --   --   LABPROT 12.9  --   --   --   --   --   --   --   --   --   --   --   --   --   INR 0.98  --   --   --   --   --   --   --   --   --   --   --   --   --   HEPARINUNFRC  --   --   --    < >  --    < >  --   --  1.09*  --    < > 0.29* 0.36 0.20*  CREATININE 2.51* 2.14*  --   --  2.65*  2.69*   < > 1.73* 1.63*  --  1.56*  --  2.39*  --   --   TROPONINI <0.03 0.03* 0.06*  --  0.07*  --   --   --   --   --   --   --   --   --    < > = values in this interval not displayed.    Estimated Creatinine Clearance: 46.3 mL/min (A) (by C-G formula based on SCr of 2.39 mg/dL (H)).   Medical History: Past Medical History:  Diagnosis Date  . Depression   . DVT (deep venous thrombosis) ([place])   . GERD (gastroesophageal reflux disease)   . Hyperlipidemia   . Hypertension   . Lupus   . Pulmonary embolism ([place])   . Renal disorder    Stage III    Assessment: 57 year old male with PE requiring anticoagulation via heparin gtt for PE, per pharmacy protocol.  Goal of Therapy:   Heparin level 0.3-0.7 units/ml Monitor platelets by anticoagulation protocol: Yes   Plan:  03/04 @ 0330 HL 1.04 supratherapeutic. Will hold heparin level for 1 hour and will restart @ 0430 @ 1100 units/hr and will recheck HL @ 1100. CBC check w/  am labs.  3/4 @ 1035 HL 1.09 supratherapeutic. Will hold heparin level for 1 hour and will restart at 1300 @ 800 units/hr and will recheck HL @ 1900.   3/4 @1925  HL therapeutic at 0.48. Will continue current rate of heparin 800 units/hr and recheck HL in 6 hours.   3/5  0130 heparin level 0.29. 1600 unit bolus and increase rate to 1000 units/hr. Recheck in 6 hours.  3/5 @ 1220 HL 0.36. Will continue heparin infusion at current rate and recheck a HL in 6 hours.   3/5 @ 1702 HL 0.20. Will order 2000 u boulus and increase infusion to 1200 u/hr. Recheck HL in 6 hours.   Pernell Dupre, PharmD, BCPS Clinical Pharmacist 02/23/2018 5:28 PM

## 2018-02-23 NOTE — Progress Notes (Signed)
Nutrition Follow-up  DOCUMENTATION CODES:   Obesity unspecified  INTERVENTION:  Resume Vital High Protein at 40 mL/hr (960 mL goal daily volume) + Pro-Stat 60 mL TID via OGT. Provides 1560 kcal, 174 grams of protein, 806 mL H2O daily.  Continue liquid MVI daily per tube.  Per discussion in rounds this week patient has issues with N/V at baseline. If he does not tolerate tube feeds again recommend initiating a prokinetic agent as clinically appropriate (metoclopramide or erythromycin) and diverting level of feeding by placement of postpyloric enteral access.  NUTRITION DIAGNOSIS:   Inadequate oral intake related to inability to eat as evidenced by NPO status.  Not met for >24 hours as tube feeds were held in setting of emesis. Restarting tube feeds today.  GOAL:   Provide needs based on ASPEN/SCCM guidelines  Addressing with re-initiation of tube feeds.  MONITOR:   Vent status, Labs, Weight trends, TF tolerance, I & O's  REASON FOR ASSESSMENT:   Ventilator, Consult Enteral/tube feeding initiation and management, Assessment of nutrition requirement/status  ASSESSMENT:   57 year old male with PMHx of HTN, pulmonary embolism, DVT on Eliquis, HLD, lupus erythematosus, CKD stage III, GERD, depression who was brought in after being found unresponsive found to have severe respiratory failure with ARDS/ALI and was emergently intubated early AM 3/3, also with renal failure.   -Patient was started on CVVHD evening of 3/3 and was discontinued on 3/4 as UOP improved.  Patient is intubated and sedated. He failed SBT this morning in setting of agitation. Tube feeds were restarted today at 40 mL/hr and plan is to assess tolerance. Per discussion in rounds this week patient has issues with N/V at baseline and has to carry an emesis bag with him. No documented bowel movement this admission. Abdomen feels soft on NFPE. No longer on [employer].  Access: 16 Fr. OGT placed 3/3; terminates in the  stomach per abdominal x-ray 3/3; 75 cm at corner of mouth  MAP: 59-106 mmHg  Tube Feeds: TFs were held overnight 3/3 as patient began vomiting and continued to be held all day on 3/4; of note patient had been vomiting prior to initiation  Patient is currently intubated on ventilator support MV: 11.7 L/min Temp (24hrs), Avg:98.3 F (36.8 C), Min:98 F (36.7 C), Max:98.9 F (37.2 C)  Propofol: N/A  Medications reviewed and include: Lasix 40 mg Q12hrs IV, Novolog 0-15 units Q4hrs, Solu-Medrol 40 mg Q12hrs, pantoprazole, fentanyl gtt, heparin gtt, norepinephrine gtt down to 6 mcg/min from 13 mcg/min yesterday, Zosyn, sodium bicarbonate 150 mEq in sterile water at 50 mL/hr.   Labs reviewed: CBG 129-150, Chloride 99, BUN 43 (increased from yesterday, but pt was on CRRT yesterday), Creatinine 2.39 (increased from yesterday, but pt was on CRRT yesterday).  I/O: 1500 mL UOP yesterday (0.5 mL/kg/hr), 350 mL UOP so far today  Weight trend: 113.8 kg on 3/5; +2.4 kg from admission  Discussed with RN and on rounds.  Diet Order:  Diet NPO time specified  EDUCATION NEEDS:   No education needs have been identified at this time  Skin:  Skin Assessment: Reviewed RN Assessment(scattered ecchymosis)  Last BM:  Unknown/PTA  Height:   Ht Readings from Last 1 Encounters:  02/02/18 6' 2"  (1.88 m)    Weight:   Wt Readings from Last 1 Encounters:  02/23/18 250 lb 14.1 oz (113.8 kg)    Ideal Body Weight:  86.4 kg(calculated with ht of 6' 2"  from previous encounter)  BMI:  Body mass index  is 32.21 kg/m.  Estimated Nutritional Needs:   Kcal:  1225-1560 (11-14 kcal/kg)  Protein:  >/= 173 grams (>/= 2 grams/kg IBW)  Fluid:  2.1-2.5 L/day (25-30 mL/kg IBW)  Willey Blade, MS, RD, LDN Office: [phone] Pager: [phone] After Hours/Weekend Pager: [phone]

## 2018-02-23 NOTE — Progress Notes (Signed)
ANTICOAGULATION CONSULT NOTE - Initial Consult  Pharmacy Consult for heparin gtt Indication: pulmonary embolus  Allergies  Allergen Reactions  . Hydrocodone Rash  . Vicodin [Hydrocodone-Acetaminophen] Hives and Rash    Severe headaches (also)    Patient Measurements: Weight: 251 lb 12.3 oz (114.2 kg) Heparin Dosing Weight: 104kg  Vital Signs: Temp: 98.1 F (36.7 C) (03/05 0000) Temp Source: Axillary (03/05 0000) BP: 108/55 (03/05 0215) Pulse Rate: 70 (03/05 0215)  Labs: Recent Labs    02/21/18 0252 02/21/18 0543 02/21/18 1209  02/21/18 1720  02/22/18 0452 02/22/18 0817 02/22/18 1035 02/22/18 1152 02/22/18 1925 02/23/18 0144  HGB 13.5 14.6  --   --   --   --  13.1  --   --   --   --  12.1*  HCT 42.8 46.2  --   --   --   --  40.7  --   --   --   --  38.9*  PLT 193 153  --   --   --   --  104*  --   --   --   --  102*  APTT <24*  --   --   --   --   --   --   --  >160*  --   --   --   LABPROT 12.9  --   --   --   --   --   --   --   --   --   --   --   INR 0.98  --   --   --   --   --   --   --   --   --   --   --   HEPARINUNFRC  --   --   --    < >  --    < >  --   --  1.09*  --  0.48 0.29*  CREATININE 2.51* 2.14*  --   --  2.65*  2.69*   < > 1.73* 1.63*  --  1.56*  --  2.39*  TROPONINI <0.03 0.03* 0.06*  --  0.07*  --   --   --   --   --   --   --    < > = values in this interval not displayed.    Estimated Creatinine Clearance: 46.4 mL/min (A) (by C-G formula based on SCr of 2.39 mg/dL (H)).   Medical History: Past Medical History:  Diagnosis Date  . Depression   . DVT (deep venous thrombosis) ([place])   . GERD (gastroesophageal reflux disease)   . Hyperlipidemia   . Hypertension   . Lupus   . Pulmonary embolism ([place])   . Renal disorder    Stage III    Medications:  Medications Prior to Admission  Medication Sig Dispense Refill Last Dose  . amLODipine (NORVASC) 5 MG tablet Take 2.5 mg by mouth at bedtime.   Unknown at Unknown  . benzonatate  (TESSALON) 200 MG capsule Take 1 capsule (200 mg total) by mouth 2 (two) times daily as needed for cough. 20 capsule 0 prn at prn  . doxycycline (VIBRA-TABS) 100 MG tablet Take 100 mg by mouth 2 (two) times daily.   Unknown at Unknown  . losartan (COZAAR) 100 MG tablet Take 100 mg by mouth daily.  6 Unknown at Unknown  . mometasone (NASONEX) 50 MCG/ACT nasal spray Place 2 sprays into the nose daily. 17 g  0 Unknown at Unknown  . mycophenolate (CELLCEPT) 500 MG tablet Take 1,000 mg by mouth 2 (two) times daily.   Unknown at Unknown  . omeprazole (PRILOSEC OTC) 20 MG tablet Take 20 mg by mouth daily.   Unknown at Unknown  . PARoxetine (PAXIL) 20 MG tablet Take 1 tablet (20 mg total) by mouth daily. 90 tablet 0 Unknown at Unknown  . sertraline (ZOLOFT) 50 MG tablet Take 50 mg by mouth daily.   Unknown at Unknown  . apixaban (ELIQUIS) 5 MG TABS tablet Take 5 mg by mouth once a day (Patient not taking: Reported on 02/21/2018) 5 tablet 0 Completed Course at Unknown time  . diphenhydrAMINE (BENADRYL) 25 mg capsule Take 1 capsule (25 mg total) by mouth every 8 (eight) hours as needed. (Patient not taking: Reported on 10/18/2017) 10 capsule 0 Not Taking  . loperamide (IMODIUM A-D) 2 MG tablet Take 1 tablet (2 mg total) 4 (four) times daily as needed by mouth for diarrhea or loose stools. (Patient not taking: Reported on 12/31/2017) 30 tablet 0 Not Taking  . moxifloxacin (AVELOX) 400 MG tablet Take 1 tablet (400 mg total) by mouth daily. (Patient not taking: Reported on 02/21/2018) 7 tablet 0 Completed Course at Unknown time  . Multiple Vitamins-Minerals (MULTIVITAMIN WITH MINERALS) tablet Take 1 tablet by mouth daily.   Not Taking at Unknown time  . mupirocin ointment (BACTROBAN) 2 % Place 1 application into the nose 2 (two) times daily. (Patient not taking: Reported on 10/18/2017) 22 g 0 Not Taking  . ondansetron (ZOFRAN ODT) 4 MG disintegrating tablet Take 1 tablet (4 mg total) every 8 (eight) hours as needed by  mouth for nausea or vomiting. (Patient not taking: Reported on 12/31/2017) 20 tablet 0 Not Taking at Unknown time  . pantoprazole (PROTONIX) 40 MG tablet TAKE 1 TABLET BY MOUTH EVERY DAY (Patient not taking: Reported on 10/18/2017) 30 tablet 0 Not Taking at Unknown time  . prednisoLONE 5 MG TABS tablet Take 10 mg by mouth daily.   Not Taking at Unknown time  . predniSONE (DELTASONE) 10 MG tablet Take 1 tablet by mouth daily.   Not Taking at Unknown time  . promethazine (PHENERGAN) 50 MG tablet Take 0.5 tablets (25 mg total) by mouth every 6 (six) hours as needed for nausea or vomiting. (Patient not taking: Reported on 10/18/2017) 30 tablet 0 Not Taking at Unknown time  . rosuvastatin (CRESTOR) 10 MG tablet Take 1 tablet (10 mg total) by mouth at bedtime. (Patient not taking: Reported on 10/18/2017) 90 tablet 0 Not Taking at Unknown time  . traMADol (ULTRAM) 50 MG tablet Take 2 tablets (100 mg total) by mouth every 6 (six) hours as needed for moderate pain. (Patient not taking: Reported on 10/18/2017) 30 tablet 0 Not Taking at Unknown time   Scheduled:  . budesonide (PULMICORT) nebulizer solution  0.5 mg Nebulization BID  . chlorhexidine gluconate (MEDLINE KIT)  15 mL Mouth Rinse BID  . feeding supplement (PRO-STAT SUGAR FREE 64)  60 mL Per Tube TID  . feeding supplement (VITAL HIGH PROTEIN)  1,000 mL Per Tube Q24H  . furosemide  40 mg Intravenous Q12H  . heparin  1,600 Units Intravenous Once  . insulin aspart  0-15 Units Subcutaneous Q4H  . ipratropium-albuterol  3 mL Nebulization Q4H  . mouth rinse  15 mL Mouth Rinse 10 times per day  . methylPREDNISolone (SOLU-MEDROL) injection  40 mg Intravenous Q12H  . multivitamin  15 mL Per Tube Daily  .  pantoprazole (PROTONIX) IV  40 mg Intravenous Q24H  . sertraline  50 mg Oral Daily  . vecuronium  10 mg Intravenous Once   Infusions:  . fentaNYL infusion INTRAVENOUS 300 mcg/hr (02/23/18 0200)  . heparin 800 Units/hr (02/23/18 0200)  . norepinephrine  (LEVOPHED) Adult infusion 9 mcg/min (02/23/18 0251)  . phenylephrine (NEO-SYNEPHRINE) Adult infusion    . piperacillin-tazobactam (ZOSYN)  IV 3.375 g (02/23/18 0300)  . pureflow 3 each (02/22/18 0925)  .  sodium bicarbonate  infusion 1000 mL 50 mL/hr at 02/23/18 0200  . vasopressin (PITRESSIN) infusion - *FOR SHOCK* Stopped (02/22/18 0928)   PRN: fentaNYL, hydrALAZINE, midazolam, ondansetron **OR** ondansetron (ZOFRAN) IV Anti-infectives (From admission, onward)   Start     Dose/Rate Route Frequency Ordered Stop   02/22/18 1200  piperacillin-tazobactam (ZOSYN) IVPB 3.375 g     3.375 g 12.5 mL/hr over 240 Minutes Intravenous Every 8 hours 02/22/18 1110     02/21/18 2100  piperacillin-tazobactam (ZOSYN) IVPB 3.375 g  Status:  Discontinued     3.375 g 100 mL/hr over 30 Minutes Intravenous Every 6 hours 02/21/18 1749 02/22/18 1110   02/21/18 1200  vancomycin (VANCOCIN) 1,250 mg in sodium chloride 0.9 % 250 mL IVPB  Status:  Discontinued     1,250 mg 166.7 mL/hr over 90 Minutes Intravenous Every 24 hours 02/21/18 0617 02/21/18 0624   02/21/18 1200  vancomycin (VANCOCIN) 1,500 mg in sodium chloride 0.9 % 500 mL IVPB  Status:  Discontinued     1,500 mg 250 mL/hr over 120 Minutes Intravenous Every 24 hours 02/21/18 0624 02/22/18 1104   02/21/18 0430  piperacillin-tazobactam (ZOSYN) IVPB 3.375 g  Status:  Discontinued     3.375 g 12.5 mL/hr over 240 Minutes Intravenous Every 8 hours 02/21/18 0419 02/21/18 1749   02/21/18 0430  vancomycin (VANCOCIN) IVPB 1000 mg/200 mL premix     1,000 mg 200 mL/hr over 60 Minutes Intravenous  Once 02/21/18 0419 02/21/18 0650   02/21/18 0400  Ampicillin-Sulbactam (UNASYN) 3 g in sodium chloride 0.9 % 100 mL IVPB  Status:  Discontinued     3 g 200 mL/hr over 30 Minutes Intravenous STAT 02/21/18 0350 02/21/18 0517      Assessment: 57 year old male with PE requiring anticoagulation via heparin gtt for PE, per pharmacy protocol.  Goal of Therapy:  Heparin  level 0.3-0.7 units/ml Monitor platelets by anticoagulation protocol: Yes   Plan:  03/04 @ 0330 HL 1.04 supratherapeutic. Will hold heparin level for 1 hour and will restart @ 0430 @ 1100 units/hr and will recheck HL @ 1100. CBC check w/ am labs.  3/4 @ 1035 HL 1.09 supratherapeutic. Will hold heparin level for 1 hour and will restart at 1300 @ 800 units/hr and will recheck HL @ 1900.   3/4 _0  HL therapeutic at 0.48. Will continue current rate of heparin 800 units/hr and recheck HL in 6 hours.   3/5  0130 heparin level 0.29. 1600 unit bolus and increase rate to 1000 units/hr. Recheck in 6 hours.  Eloise Harman, PharmD, BCPS Clinical Pharmacist 02/23/2018 3:46 AM

## 2018-02-23 NOTE — Care Management (Addendum)
RNCM received verbal consult from MD that patient's daughter Jonathon Snow has questions about POA and FMLA. Hospital chaplain explained that there are some family dynamics involved. The Son does not have a good relationship with patient but Jonathon Snow does and son is here to support Jonathon Snow.  Jonathon Snow was not in room. Patient is still vented with mitts on but turned his head to see me when I entered his ICU room.  A young man, that states he is not patient's son but that patient reared him, is at beside-holding patient's hand. There was also a woman that states he is patient's ex-wife standing at bedside. I left my business card with them and asked them to have Jonathon Snow call me when she had time.  I advised that she would need to go to her work Programmer, applications for [employer] forms to complete and that I could assist her.  RNCM will follow. 11:30AM: RNCM met with patient's daughter. She is seeking guardianship of patient via attorney. She is seeking FMLA paper work from [employer] and will reach out to [facility].

## 2018-02-23 NOTE — Progress Notes (Signed)
ANTICOAGULATION CONSULT NOTE - Initial Consult  Pharmacy Consult for heparin gtt Indication: pulmonary embolus  Allergies  Allergen Reactions  . Hydrocodone Rash  . Vicodin [Hydrocodone-Acetaminophen] Hives and Rash    Severe headaches (also)    Patient Measurements: Weight: 250 lb 14.1 oz (113.8 kg) Heparin Dosing Weight: 104kg  Vital Signs: Temp: 98.1 F (36.7 C) (03/05 0800) Temp Source: Axillary (03/05 0400) BP: 114/61 (03/05 0900) Pulse Rate: 73 (03/05 0900)  Labs: Recent Labs    02/21/18 0252 02/21/18 0543 02/21/18 1209  02/21/18 1720  02/22/18 0452 02/22/18 0817 02/22/18 1035 02/22/18 1152 02/22/18 1925 02/23/18 0144 02/23/18 1000  HGB 13.5 14.6  --   --   --   --  13.1  --   --   --   --  12.1*  --   HCT 42.8 46.2  --   --   --   --  40.7  --   --   --   --  38.9*  --   PLT 193 153  --   --   --   --  104*  --   --   --   --  102*  --   APTT <24*  --   --   --   --   --   --   --  >160*  --   --   --   --   LABPROT 12.9  --   --   --   --   --   --   --   --   --   --   --   --   INR 0.98  --   --   --   --   --   --   --   --   --   --   --   --   HEPARINUNFRC  --   --   --    < >  --    < >  --   --  1.09*  --  0.48 0.29* 0.36  CREATININE 2.51* 2.14*  --   --  2.65*  2.69*   < > 1.73* 1.63*  --  1.56*  --  2.39*  --   TROPONINI <0.03 0.03* 0.06*  --  0.07*  --   --   --   --   --   --   --   --    < > = values in this interval not displayed.    Estimated Creatinine Clearance: 46.3 mL/min (A) (by C-G formula based on SCr of 2.39 mg/dL (H)).   Medical History: Past Medical History:  Diagnosis Date  . Depression   . DVT (deep venous thrombosis) ([place])   . GERD (gastroesophageal reflux disease)   . Hyperlipidemia   . Hypertension   . Lupus   . Pulmonary embolism ([place])   . Renal disorder    Stage III    Medications:  Medications Prior to Admission  Medication Sig Dispense Refill Last Dose  . amLODipine (NORVASC) 5 MG tablet Take 2.5 mg by  mouth at bedtime.   Unknown at Unknown  . benzonatate (TESSALON) 200 MG capsule Take 1 capsule (200 mg total) by mouth 2 (two) times daily as needed for cough. 20 capsule 0 prn at prn  . doxycycline (VIBRA-TABS) 100 MG tablet Take 100 mg by mouth 2 (two) times daily.   Unknown at Unknown  . losartan (COZAAR) 100 MG tablet Take  100 mg by mouth daily.  6 Unknown at Unknown  . mometasone (NASONEX) 50 MCG/ACT nasal spray Place 2 sprays into the nose daily. 17 g 0 Unknown at Unknown  . mycophenolate (CELLCEPT) 500 MG tablet Take 1,000 mg by mouth 2 (two) times daily.   Unknown at Unknown  . omeprazole (PRILOSEC OTC) 20 MG tablet Take 20 mg by mouth daily.   Unknown at Unknown  . PARoxetine (PAXIL) 20 MG tablet Take 1 tablet (20 mg total) by mouth daily. 90 tablet 0 Unknown at Unknown  . sertraline (ZOLOFT) 50 MG tablet Take 50 mg by mouth daily.   Unknown at Unknown  . apixaban (ELIQUIS) 5 MG TABS tablet Take 5 mg by mouth once a day (Patient not taking: Reported on 02/21/2018) 5 tablet 0 Completed Course at Unknown time  . diphenhydrAMINE (BENADRYL) 25 mg capsule Take 1 capsule (25 mg total) by mouth every 8 (eight) hours as needed. (Patient not taking: Reported on 10/18/2017) 10 capsule 0 Not Taking  . loperamide (IMODIUM A-D) 2 MG tablet Take 1 tablet (2 mg total) 4 (four) times daily as needed by mouth for diarrhea or loose stools. (Patient not taking: Reported on 12/31/2017) 30 tablet 0 Not Taking  . moxifloxacin (AVELOX) 400 MG tablet Take 1 tablet (400 mg total) by mouth daily. (Patient not taking: Reported on 02/21/2018) 7 tablet 0 Completed Course at Unknown time  . Multiple Vitamins-Minerals (MULTIVITAMIN WITH MINERALS) tablet Take 1 tablet by mouth daily.   Not Taking at Unknown time  . mupirocin ointment (BACTROBAN) 2 % Place 1 application into the nose 2 (two) times daily. (Patient not taking: Reported on 10/18/2017) 22 g 0 Not Taking  . ondansetron (ZOFRAN ODT) 4 MG disintegrating tablet Take 1  tablet (4 mg total) every 8 (eight) hours as needed by mouth for nausea or vomiting. (Patient not taking: Reported on 12/31/2017) 20 tablet 0 Not Taking at Unknown time  . pantoprazole (PROTONIX) 40 MG tablet TAKE 1 TABLET BY MOUTH EVERY DAY (Patient not taking: Reported on 10/18/2017) 30 tablet 0 Not Taking at Unknown time  . prednisoLONE 5 MG TABS tablet Take 10 mg by mouth daily.   Not Taking at Unknown time  . predniSONE (DELTASONE) 10 MG tablet Take 1 tablet by mouth daily.   Not Taking at Unknown time  . promethazine (PHENERGAN) 50 MG tablet Take 0.5 tablets (25 mg total) by mouth every 6 (six) hours as needed for nausea or vomiting. (Patient not taking: Reported on 10/18/2017) 30 tablet 0 Not Taking at Unknown time  . rosuvastatin (CRESTOR) 10 MG tablet Take 1 tablet (10 mg total) by mouth at bedtime. (Patient not taking: Reported on 10/18/2017) 90 tablet 0 Not Taking at Unknown time  . traMADol (ULTRAM) 50 MG tablet Take 2 tablets (100 mg total) by mouth every 6 (six) hours as needed for moderate pain. (Patient not taking: Reported on 10/18/2017) 30 tablet 0 Not Taking at Unknown time   Scheduled:  . budesonide (PULMICORT) nebulizer solution  0.5 mg Nebulization BID  . chlorhexidine gluconate (MEDLINE KIT)  15 mL Mouth Rinse BID  . feeding supplement (PRO-STAT SUGAR FREE 64)  60 mL Per Tube TID  . feeding supplement (VITAL HIGH PROTEIN)  1,000 mL Per Tube Q24H  . furosemide  40 mg Intravenous Q12H  . insulin aspart  0-15 Units Subcutaneous Q4H  . ipratropium-albuterol  3 mL Nebulization Q4H  . mouth rinse  15 mL Mouth Rinse 10 times per day  .  methylPREDNISolone (SOLU-MEDROL) injection  40 mg Intravenous Q12H  . multivitamin  15 mL Per Tube Daily  . pantoprazole (PROTONIX) IV  40 mg Intravenous Q24H  . sertraline  50 mg Oral Daily  . vecuronium  10 mg Intravenous Once   Infusions:  . fentaNYL infusion INTRAVENOUS 300 mcg/hr (02/23/18 0753)  . heparin 1,000 Units/hr (02/23/18 0752)  .  norepinephrine (LEVOPHED) Adult infusion 8 mcg/min (02/23/18 0600)  . phenylephrine (NEO-SYNEPHRINE) Adult infusion    . piperacillin-tazobactam (ZOSYN)  IV Stopped (02/23/18 0719)  . pureflow 3 each (02/22/18 0925)  .  sodium bicarbonate  infusion 1000 mL 50 mL/hr at 02/23/18 0918  . vasopressin (PITRESSIN) infusion - *FOR SHOCK* Stopped (02/22/18 0928)   PRN: fentaNYL, hydrALAZINE, midazolam, ondansetron **OR** ondansetron (ZOFRAN) IV Anti-infectives (From admission, onward)   Start     Dose/Rate Route Frequency Ordered Stop   02/22/18 1200  piperacillin-tazobactam (ZOSYN) IVPB 3.375 g     3.375 g 12.5 mL/hr over 240 Minutes Intravenous Every 8 hours 02/22/18 1110     02/21/18 2100  piperacillin-tazobactam (ZOSYN) IVPB 3.375 g  Status:  Discontinued     3.375 g 100 mL/hr over 30 Minutes Intravenous Every 6 hours 02/21/18 1749 02/22/18 1110   02/21/18 1200  vancomycin (VANCOCIN) 1,250 mg in sodium chloride 0.9 % 250 mL IVPB  Status:  Discontinued     1,250 mg 166.7 mL/hr over 90 Minutes Intravenous Every 24 hours 02/21/18 0617 02/21/18 0624   02/21/18 1200  vancomycin (VANCOCIN) 1,500 mg in sodium chloride 0.9 % 500 mL IVPB  Status:  Discontinued     1,500 mg 250 mL/hr over 120 Minutes Intravenous Every 24 hours 02/21/18 0624 02/22/18 1104   02/21/18 0430  piperacillin-tazobactam (ZOSYN) IVPB 3.375 g  Status:  Discontinued     3.375 g 12.5 mL/hr over 240 Minutes Intravenous Every 8 hours 02/21/18 0419 02/21/18 1749   02/21/18 0430  vancomycin (VANCOCIN) IVPB 1000 mg/200 mL premix     1,000 mg 200 mL/hr over 60 Minutes Intravenous  Once 02/21/18 0419 02/21/18 0650   02/21/18 0400  Ampicillin-Sulbactam (UNASYN) 3 g in sodium chloride 0.9 % 100 mL IVPB  Status:  Discontinued     3 g 200 mL/hr over 30 Minutes Intravenous STAT 02/21/18 0350 02/21/18 0517      Assessment: 57 year old male with PE requiring anticoagulation via heparin gtt for PE, per pharmacy protocol.  Goal of  Therapy:  Heparin level 0.3-0.7 units/ml Monitor platelets by anticoagulation protocol: Yes   Plan:  03/04 @ 0330 HL 1.04 supratherapeutic. Will hold heparin level for 1 hour and will restart @ 0430 @ 1100 units/hr and will recheck HL @ 1100. CBC check w/ am labs.  3/4 @ 1035 HL 1.09 supratherapeutic. Will hold heparin level for 1 hour and will restart at 1300 @ 800 units/hr and will recheck HL @ 1900.   3/4 _0  HL therapeutic at 0.48. Will continue current rate of heparin 800 units/hr and recheck HL in 6 hours.   3/5  0130 heparin level 0.29. 1600 unit bolus and increase rate to 1000 units/hr. Recheck in 6 hours.  3/5 @ 1220 HL 0.36. Will continue heparin infusion at current rate and recheck a HL in 6 hours.   Napoleon Form, PharmD, BCPS Clinical Pharmacist 02/23/2018 12:22 PM

## 2018-02-23 NOTE — Consult Note (Signed)
[facility] PATIENT NAME: Jonathon Snow   MR#:  [id number] DATE OF BIRTH:  1961-08-05 DATE OF ADMISSION:  02/21/2018 PRIMARY CARE PHYSICIAN: Arnetha Courser, MD  REQUESTING/REFERRING PHYSICIAN: Malinda  CHIEF COMPLAINT:   Chief Complaint  Patient presents with  . Unresponsive  resp failure  EVENTS 3/3 intubated 3/3 Vasc cath and  CVL placed, CRRT started, bicarb started 3/4 CRRt stopped fio2 at 80%  HISTORY OF PRESENT ILLNESS: Patient remains intubated Sedated On full vent support On vasopressors Bicarb infusion CRRT stopped  Patient with severe resp failure fio2 at 40% PEEP at 8    REVIEW OF SYSTEMS:  Could not be obtained as patient is on ventilator   VITAL SIGNS: Blood pressure 118/63, pulse 87, temperature 98.1 F (36.7 C), resp. rate (!) 21, weight 250 lb 14.1 oz (113.8 kg), SpO2 100 %.  PHYSICAL EXAMINATION:  GENERAL:critically ill appearing, +resp distress HEAD: Normocephalic, atraumatic.  EYES: Pupils equal, round, reactive to light.  No scleral icterus.  MOUTH: Moist mucosal membrane. NECK: Supple. No thyromegaly. No nodules. No JVD.  PULMONARY: +rhonchi, +wheezing CARDIOVASCULAR: S1 and S2. Regular rate and rhythm. No murmurs, rubs, or gallops.  GASTROINTESTINAL: Soft, nontender, -distended. No masses. Positive bowel sounds. No hepatosplenomegaly.  MUSCULOSKELETAL: No swelling, clubbing, or edema.  NEUROLOGIC: obtunded SKIN:intact,warm,dry   LABORATORY PANEL:   CBC Recent Labs  Lab 02/21/18 0252 02/21/18 0543 02/22/18 0452 02/23/18 0144  WBC 8.6 3.9 15.5* 23.3*  HGB 13.5 14.6 13.1 12.1*  HCT 42.8 46.2 40.7 38.9*  PLT 193 153 104* 102*  MCV 92.2 91.8 90.8 90.4  MCH 29.0 29.0 29.3 28.2  MCHC 31.4* 31.6* 32.3 31.2*  RDW 15.3* 15.5* 15.0* 15.2*  LYMPHSABS 1.6  --   --   --   MONOABS 0.2  --   --   --   EOSABS 0.1  --   --   --   BASOSABS 0.1  --   --   --     ------------------------------------------------------------------------------------------------------------------  Chemistries  Recent Labs  Lab 02/21/18 0252 02/21/18 0543  02/22/18 0048 02/22/18 0452 02/22/18 0817 02/22/18 1152 02/23/18 0144  NA 139 140   < > 140 138 139 139 139  K 4.7 4.2   < > 4.7 4.3 4.1 4.0 3.9  CL 105 110   < > 107 104 103 100* 99*  CO2 24 20*   < > 25 25 27 29 28   GLUCOSE 310* 147*   < > 213* 189* 215* 174* 151*  BUN 31* 31*   < > 32* 31* 27* 25* 43*  CREATININE 2.51* 2.14*   < > 1.78* 1.73* 1.63* 1.56* 2.39*  CALCIUM 8.4* 7.8*   < > 7.4* 7.4* 7.5* 7.9* 8.0*  MG  --   --    < > 1.5* 2.8* 2.2 2.1 2.1  AST 30 29  --   --   --   --   --   --   ALT 18 21  --   --   --   --   --   --   ALKPHOS 79 81  --   --   --   --   --   --   BILITOT 0.6 0.4  --   --   --   --   --   --    < > = values in this interval not displayed.   ------------------------ IMPRESSION AND PLAN: 57 year old male patient with history of DVT, pulmonary embolism,  CKD stage III, lupus erythematosus presented to the emergency room with decreased responsiveness. Patient with severe resp failure ARDS/ALI with renal failure from aspiration pneumonia  1.Severe Respiratory Failure -continue Full MV support -continue Bronchodilator Therapy -Wean Fio2 and PEEP as tolerated IV Zosyn antibiotic Plan for SAT/SBT  2.Renal Failure-most likely due to ATN -follow chem 7 -follow UO -continue Foley Catheter Follow up Nephrology recs On biCARB infusion  3.- intubated and sedated - minimal sedation to achieve a RASS goal: -1   4.h/o DVT/PE -Started heparin infusion   Critical Care Time devoted to patient care services described in this note is 37 minutes.   Overall, patient is critically ill, prognosis is guarded.  Patient with Multiorgan failure and at high risk for cardiac arrest and death.   Daughter at bedside updated and notified   Bricelyn Freestone Patricia Pesa, M.D.  [facility]  Medical Director [place] Director [facility] Department

## 2018-02-23 NOTE — Progress Notes (Signed)
[place] [place] NAME: Jeremaih Klima    MR#:  [id number]  DATE OF BIRTH:  1961/04/13  SUBJECTIVE:  CHIEF COMPLAINT:   Chief Complaint  Patient presents with  . Unresponsive  Continue need for ventilation on ventilator  REVIEW OF SYSTEMS:  CONSTITUTIONAL: No fever, fatigue or weakness.  EYES: No blurred or double vision.  EARS, NOSE, AND THROAT: No tinnitus or ear pain.  RESPIRATORY: No cough, shortness of breath, wheezing or hemoptysis.  CARDIOVASCULAR: No chest pain, orthopnea, edema.  GASTROINTESTINAL: No nausea, vomiting, diarrhea or abdominal pain.  GENITOURINARY: No dysuria, hematuria.  ENDOCRINE: No polyuria, nocturia,  HEMATOLOGY: No anemia, easy bruising or bleeding SKIN: No rash or lesion. MUSCULOSKELETAL: No joint pain or arthritis.   NEUROLOGIC: No tingling, numbness, weakness.  PSYCHIATRY: No anxiety or depression.   ROS  DRUG ALLERGIES:   Allergies  Allergen Reactions  . Hydrocodone Rash  . Vicodin [Hydrocodone-Acetaminophen] Hives and Rash    Severe headaches (also)    VITALS:  Blood pressure 125/63, pulse 72, temperature 98.2 F (36.8 C), resp. rate (!) 24, weight 113.8 kg (250 lb 14.1 oz), SpO2 96 %.  PHYSICAL EXAMINATION:  GENERAL:  57 y.o.-year-old patient lying in the bed with no acute distress.  EYES: Pupils equal, round, reactive to light and accommodation. No scleral icterus. Extraocular muscles intact.  HEENT: Head atraumatic, normocephalic. Oropharynx and nasopharynx clear.  NECK:  Supple, no jugular venous distention. No thyroid enlargement, no tenderness.  LUNGS: Normal breath sounds bilaterally, no wheezing, rales,rhonchi or crepitation. No use of accessory muscles of respiration.  CARDIOVASCULAR: S1, S2 normal. No murmurs, rubs, or gallops.  ABDOMEN: Soft, nontender, nondistended. Bowel sounds present. No organomegaly or mass.  EXTREMITIES: No pedal edema, cyanosis, or clubbing.  NEUROLOGIC:  Cranial nerves II through XII are intact. Muscle strength 5/5 in all extremities. Sensation intact. Gait not checked.  PSYCHIATRIC: The patient is alert and oriented x 3.  SKIN: No obvious rash, lesion, or ulcer.   Physical Exam LABORATORY PANEL:   CBC Recent Labs  Lab 02/23/18 0144  WBC 23.3*  HGB 12.1*  HCT 38.9*  PLT 102*   ------------------------------------------------------------------------------------------------------------------  Chemistries  Recent Labs  Lab 02/21/18 0543  02/23/18 0144  NA 140   < > 139  K 4.2   < > 3.9  CL 110   < > 99*  CO2 20*   < > 28  GLUCOSE 147*   < > 151*  BUN 31*   < > 43*  CREATININE 2.14*   < > 2.39*  CALCIUM 7.8*   < > 8.0*  MG  --    < > 2.1  AST 29  --   --   ALT 21  --   --   ALKPHOS 81  --   --   BILITOT 0.4  --   --    < > = values in this interval not displayed.   ------------------------------------------------------------------------------------------------------------------  Cardiac Enzymes Recent Labs  Lab 02/21/18 1209 02/21/18 1720  TROPONINI 0.06* 0.07*   ------------------------------------------------------------------------------------------------------------------  RADIOLOGY:  No results found.  ASSESSMENT AND PLAN:  57 year old male patient with history of DVT, pulmonary embolism, CKD stage III, lupus erythematosus presented to the emergency room with decreased responsiveness.  Admitting diagnosis 1.Acute hypoxic respiratory failure  Stable  Secondary to pneumonia Continue ventilator with weaning as tolerated   2. Acute encephalopathy  altered mental status Most likely secondary to pneumonia with associated acute hypoxic respiratory  failure  3.Acute aspiration pneumonia Stable Continue empiric antibiotics on our pneumonia protocol  4.  Acute septic shock  Stable Secondary to acute pneumonia Continue pressors with weaning as tolerated, antibiotics per above, sepsis  protocol  5.ARF/AKI w/ CKD III Improved  Secondary to ATN from acute illness  Treated with CRRT-discontinued on March 25, 2018  Nephrology input greatly appreciated  6.Lupus erythematosus Stable  7.History of DVT and pulmonary embolism On heparin drip, Eliquis on hold   All the records are reviewed and case discussed with Care Management/Social Workerr. Management plans discussed with the patient, family and they are in agreement.  CODE STATUS: full  TOTAL TIME TAKING CARE OF THIS PATIENT: 35 minutes.     POSSIBLE D/C IN 2-5 DAYS, DEPENDING ON CLINICAL CONDITION.   Avel Peace Salary M.D on 02/23/2018   Between 7am to 6pm - Pager - [phone]  After 6pm go to [email or web address] - password [email or web address] [facility]  Office  [phone]  CC: Primary care physician; Arnetha Courser, MD  Note: This dictation was prepared with Dragon dictation along with smaller phrase technology. Any transcriptional errors that result from this process are unintentional.

## 2018-02-23 NOTE — Clinical Social Work Note (Signed)
Clinical Social Work Assessment  Patient Details  Name: KACIN DANCY MRN: [id number] Date of Birth: 08/04/1961  Date of referral:  02/23/18               Reason for consult:  Other (Comment Required)                Permission sought to share information with:    Permission granted to share information::     Name::        Agency::     Relationship::     Contact Information:     Housing/Transportation Living arrangements for the past 2 months:  Single Family Home Source of Information:  Adult Children Patient Interpreter Needed:  None Criminal Activity/Legal Involvement Pertinent to Current Situation/Hospitalization:  No - Comment as needed Significant Relationships:  Adult Children Lives with:  Other (Comment)(male companion) Do you feel safe going back to the place where you live?    Need for family participation in patient care:  Yes (Comment)  Care giving concerns:  Patient resides at home and has a male companion that has been staying with him along with her two children.    Social Worker assessment / plan:  CSW contacted and patient's daughter was requesting to speak with CSW. CSW went to patient's room and found patient's daughter sitting at patient's bedside crying. CSW met with patient's daughter: Carvel Getting: [phone] in a family waiting area. [name] had numerous questions she wanted to ask. Initially she discussed how she was simply trying to process everything that had happened in the past 48 hours. [name] stated that she was just told by staff that her father was abusing drugs. She stated that her father works 53 hours a week as a Scientist, clinical (histocompatibility and immunogenetics) and due to his diagnosis of lupus, he sleeps most of the other time. She stated that he has begun getting into relationships with young women who abuse drugs over the past two years. She stated that today, she took the police to patient's home where she was able to get some of his belongings. She stated that a young  male was at the house and the police stated that they felt the woman was high on some type of drug. [name] stated that this woman was attempting to care for two young children as well. [name] stated that the police were going to contact DSS CPS and that she was as well. [name] stated that she and her father were beginning to work on financial arrangements due to her father stating that due to his lupus he probably would need to get this plans in place. She wanted to verify that in patient's current condition, he would not be able to complete a power of attorney and CSW confirmed this for her. She stated she has a call into an attorney and is waiting on a return call. CSW advised that she speak with the attorney and make a list of all of her questions that she has regarding how to pay patient's bills, how to handle patient's affairs in the meantime, etc. CSW did advise that being patient's next of kin along with her brother, that they will serve as decision makers in her healthcare. [name] stated that she and her brother are working together with everything so this will not be an issue.   CSW spent much time with Jonelle Sidle assisting her with processing her current situation with her father and providing supportive counseling. CSW discussed ways of coping and if she had  received any bereavement services in the past with the passing of her grandmother a year ago. [name] stated that she attempted it but did not feel as though the therapist was helping. Tillie Fantasia is aware of how she can directly reach me and is also aware that CSW will be following for discharge planning.  Employment status:  [employer] information:  Managed Care PT Recommendations:    Information / Referral to community resources:     Patient/Family's Response to care:  [name] expressed appreciation for CSW visit.  Patient/Family's Understanding of and Emotional Response to Diagnosis, Current Treatment, and Prognosis:  Jonelle Sidle is  feeling overwhelmed currently and is also very involved with her father's treatment plan.   Emotional Assessment Appearance:  Appears stated age Attitude/Demeanor/Rapport:  Unable to Assess Affect (typically observed):  Unable to Assess Orientation:  (patient on ventilator) Alcohol / Substance use:  (unclear as patient cannot speak and urine drug screen is positive for opiods) Psych involvement (Current and /or in the community):  No (Comment)  Discharge Needs  Concerns to be addressed:  Coping/Stress Concerns, Decision making concerns, Legal Concerns Readmission within the last 30 days:  No Current discharge risk:    Barriers to Discharge:  Continued Medical Work up   [employer], [place] 02/23/2018, 11:45 AM

## 2018-02-23 NOTE — Progress Notes (Signed)
   02/23/18 1130  Clinical Encounter Type  Visited With Family  Visit Type Follow-up   Chaplain followed up with patient daughter.  Daughter shared thoughts and feelings regarding patient's health and events of past day.  Chaplain offered emotional support, utilized active listening.  To continue to follow.

## 2018-02-23 NOTE — Progress Notes (Signed)
[name]  ROUNDING NOTE   Subjective:  Patient seen at bedside. He is arousable and will follow simple commands at the moment. He was taken off of CRRT yesterday. Urine output was 1.5 L over the preceding 24 hours.   Objective:  Vital signs in last 24 hours:  Temp:  [96.7 F (35.9 C)-98.9 F (37.2 C)] 98.1 F (36.7 C) (03/05 0800) Pulse Rate:  [55-90] 87 (03/05 0800) Resp:  [10-27] 21 (03/05 0800) BP: (95-152)/(43-87) 118/63 (03/05 0800) SpO2:  [90 %-100 %] 100 % (03/05 0800) FiO2 (%):  [35 %-40 %] 40 % (03/05 0300) Weight:  [113.8 kg (250 lb 14.1 oz)] 113.8 kg (250 lb 14.1 oz) (03/05 0433)  Weight change: -0.4 kg (-14.1 oz) Filed Weights   02/21/18 0509 02/22/18 0456 02/23/18 0433  Weight: 111.4 kg (245 lb 9.5 oz) 114.2 kg (251 lb 12.3 oz) 113.8 kg (250 lb 14.1 oz)    Intake/Output: I/O last 3 completed shifts: In: 6160.6 [I.V.:5228; NG/GT:682.7; IV Piggyback:250] Out: 2235 [Urine:2235]   Intake/Output this shift:  Total I/O In: 193 [I.V.:193] Out: 350 [Urine:350]  Physical Exam: General: Critically Ill  Head: ETT, OGT  Eyes: Anicteric  Neck: right IJ triple lumen  Lungs:  Bilateral crackles, vent assisted  Heart: Regular rate and rhythm  Abdomen:  Soft, nontender, BS present  Extremities: 1+ peripheral edema.  Neurologic: Intubated, but will follow commands this AM.  Skin: No lesions  Access: Right femoral temp HD catheter Dr. Mortimer Fries 3/3    Basic Metabolic Panel: Recent Labs  Lab 02/21/18 2150 02/22/18 0048 02/22/18 0452 02/22/18 0817 02/22/18 1152 02/23/18 0144  NA 140 140 138 139 139 139  K 5.4* 4.7 4.3 4.1 4.0 3.9  CL 107 107 104 103 100* 99*  CO2 23 25 25 27 29 28   GLUCOSE 208* 213* 189* 215* 174* 151*  BUN 34* 32* 31* 27* 25* 43*  CREATININE 2.20* 1.78* 1.73* 1.63* 1.56* 2.39*  CALCIUM 7.5* 7.4* 7.4* 7.5* 7.9* 8.0*  MG  --  1.5* 2.8* 2.2 2.1 2.1  PHOS 3.8 3.4 3.5 3.1 2.6  --     Liver Function Tests: Recent Labs  Lab  02/21/18 0252 02/21/18 0543  02/21/18 2150 02/22/18 0048 02/22/18 0452 02/22/18 0817 02/22/18 1152  AST 30 29  --   --   --   --   --   --   ALT 18 21  --   --   --   --   --   --   ALKPHOS 79 81  --   --   --   --   --   --   BILITOT 0.6 0.4  --   --   --   --   --   --   PROT 6.4* 6.4*  --   --   --   --   --   --   ALBUMIN 3.4* 3.2*   < > 2.8* 2.8* 2.7* 2.7* 2.7*   < > = values in this interval not displayed.   Recent Labs  Lab 02/21/18 0252  LIPASE 56*   No results for input(s): AMMONIA in the last 168 hours.  CBC: Recent Labs  Lab 02/21/18 0252 02/21/18 0543 02/22/18 0452 02/23/18 0144  WBC 8.6 3.9 15.5* 23.3*  NEUTROABS 6.7*  --   --   --   HGB 13.5 14.6 13.1 12.1*  HCT 42.8 46.2 40.7 38.9*  MCV 92.2 91.8 90.8 90.4  PLT 193 153  104* 102*    Cardiac Enzymes: Recent Labs  Lab 02/21/18 0252 02/21/18 0543 02/21/18 1209 02/21/18 1720  TROPONINI <0.03 0.03* 0.06* 0.07*    BNP: Invalid input(s): POCBNP  CBG: Recent Labs  Lab 02/22/18 1619 02/22/18 1939 02/22/18 2345 02/23/18 0418 02/23/18 0741  GLUCAP 125* 136* 150* 135* 129*    Microbiology: Results for orders placed or performed during the hospital encounter of 02/21/18  MRSA PCR Screening     Status: None   Collection Time: 02/21/18  4:23 AM  Result Value Ref Range Status   MRSA by PCR NEGATIVE NEGATIVE Final    Comment:        The GeneXpert MRSA Assay (FDA approved for NASAL specimens only), is one component of a comprehensive MRSA colonization surveillance program. It is not intended to diagnose MRSA infection nor to guide or monitor treatment for MRSA infections. Performed at [facility], [address] [place], [place] [address]     Coagulation Studies: Recent Labs    02/21/18 0252  LABPROT 12.9  INR 0.98    Urinalysis: Recent Labs    02/21/18 0318  COLORURINE YELLOW*  LABSPEC 1.014  PHURINE 5.0  GLUCOSEU NEGATIVE  HGBUR LARGE*  BILIRUBINUR NEGATIVE   KETONESUR NEGATIVE  PROTEINUR 100*  NITRITE NEGATIVE  LEUKOCYTESUR NEGATIVE      Imaging: Dg Chest Port 1 View  Result Date: 02/21/2018 CLINICAL DATA:  57 year old male status post central line placement. Respiratory failure, intubated. EXAM: PORTABLE CHEST 1 VIEW COMPARISON:  0311 hours today and earlier. FINDINGS: Portable AP semi upright view at 0944 hours. A right IJ central line now is in place. The catheter tip projects at the cavoatrial junction level. An enteric tube has been placed, courses to the abdomen, tip not included. Endotracheal tube tip is stable up the level the clavicles. More kyphotic positioning now. No pneumothorax identified. Veiling opacity in the right lung. Increased retrocardiac opacity right greater than left. Stable left lung aside from left retrocardiac opacity. Pulmonary vascularity appears stable. IMPRESSION: 1. Right IJ central line placed, tip at the cavoatrial junction level. 2. Enteric tube placed, courses to the abdomen tip not included. 3. No pneumothorax, but increasing veiling and confluent opacity in the right lung suggesting right pleural effusion plus new right lower lobe collapse or consolidation. Electronically Signed   By: Genevie Ann M.D.   On: 02/21/2018 10:04     Medications:   . fentaNYL infusion INTRAVENOUS 300 mcg/hr (02/23/18 0753)  . heparin 1,000 Units/hr (02/23/18 0752)  . norepinephrine (LEVOPHED) Adult infusion 8 mcg/min (02/23/18 0600)  . phenylephrine (NEO-SYNEPHRINE) Adult infusion    . piperacillin-tazobactam (ZOSYN)  IV Stopped (02/23/18 0719)  . pureflow 3 each (02/22/18 0925)  .  sodium bicarbonate  infusion 1000 mL 50 mL/hr at 02/23/18 0600  . vasopressin (PITRESSIN) infusion - *FOR SHOCK* Stopped (02/22/18 9417)   . budesonide (PULMICORT) nebulizer solution  0.5 mg Nebulization BID  . chlorhexidine gluconate (MEDLINE KIT)  15 mL Mouth Rinse BID  . feeding supplement (PRO-STAT SUGAR FREE 64)  60 mL Per Tube TID  . feeding  supplement (VITAL HIGH PROTEIN)  1,000 mL Per Tube Q24H  . furosemide  40 mg Intravenous Q12H  . insulin aspart  0-15 Units Subcutaneous Q4H  . ipratropium-albuterol  3 mL Nebulization Q4H  . mouth rinse  15 mL Mouth Rinse 10 times per day  . methylPREDNISolone (SOLU-MEDROL) injection  40 mg Intravenous Q12H  . multivitamin  15 mL Per Tube Daily  .  pantoprazole (PROTONIX) IV  40 mg Intravenous Q24H  . sertraline  50 mg Oral Daily  . vecuronium  10 mg Intravenous Once   fentaNYL, hydrALAZINE, midazolam, ondansetron **OR** ondansetron (ZOFRAN) IV  Assessment/ Plan:  [name] is a 57 y.o. 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 Intubated and sedated.   1. Acute Renal Failure on chronic kidney disease stage III: baseline creatinine of 1.75, GFR of 44 on 10/2017.  Chronic kidney disease stage III with hematuria and proteinuria secondary to lupus nephritis.  Currently off cellcept.   Acute renal failure from ATN from acute illness, prerenal azotemia and hypoxia -BUN and creatinine are higher today but this is to be expected off of CRRT.  Urine output was 1.5 L over the preceding 24 hours.  No further indication for dialysis at the moment.  Continue to monitor renal parameters and avoid nephrotoxins as possible.  Keep patient off of CellCept for now.  2. Acute respiratory failure requiring mechanical ventilation: Patient is following commands at the moment.  Question as to whether he will be able to be extubated soon.  3.  Hypotension.  Hopefully norepinephrine can be weaned off today.  Blood pressure currently 118/63.  LOS: 2 [place] 3/5/20199:08 AM

## 2018-02-24 ENCOUNTER — Telehealth: Payer: Self-pay

## 2018-02-24 LAB — BASIC METABOLIC PANEL
ANION GAP: 12 (ref 5–15)
BUN: 69 mg/dL — ABNORMAL HIGH (ref 6–20)
CALCIUM: 8 mg/dL — AB (ref 8.9–10.3)
CO2: 33 mmol/L — ABNORMAL HIGH (ref 22–32)
CREATININE: 2.03 mg/dL — AB (ref 0.61–1.24)
Chloride: 96 mmol/L — ABNORMAL LOW (ref 101–111)
GFR calc non Af Amer: 35 mL/min — ABNORMAL LOW (ref >60)
GFR, EST AFRICAN AMERICAN: 40 mL/min — AB (ref >60)
Glucose, Bld: 142 mg/dL — ABNORMAL HIGH (ref 65–99)
Potassium: 3.3 mmol/L — ABNORMAL LOW (ref 3.5–5.1)
SODIUM: 141 mmol/L (ref 135–145)

## 2018-02-24 LAB — GLUCOSE, CAPILLARY
GLUCOSE-CAPILLARY: 153 mg/dL — AB (ref 65–99)
GLUCOSE-CAPILLARY: 160 mg/dL — AB (ref 65–99)
Glucose-Capillary: 171 mg/dL — ABNORMAL HIGH (ref 65–99)
Glucose-Capillary: 120 mg/dL — ABNORMAL HIGH (ref 65–99)
Glucose-Capillary: 154 mg/dL — ABNORMAL HIGH (ref 65–99)
Glucose-Capillary: 146 mg/dL — ABNORMAL HIGH (ref 65–99)

## 2018-02-24 LAB — CBC
HCT: 36 % — ABNORMAL LOW (ref 40.0–52.0)
Hemoglobin: 11.5 g/dL — ABNORMAL LOW (ref 13.0–18.0)
MCH: 28.5 pg (ref 26.0–34.0)
MCHC: 32 g/dL (ref 32.0–36.0)
MCV: 88.9 fL (ref 80.0–100.0)
PLATELETS: 108 10*3/uL — AB (ref 150–440)
RBC: 4.05 MIL/uL — ABNORMAL LOW (ref 4.40–5.90)
RDW: 14.8 % — AB (ref 11.5–14.5)
WBC: 19.1 10*3/uL — AB (ref 3.8–10.6)

## 2018-02-24 LAB — HEPARIN LEVEL (UNFRACTIONATED)
HEPARIN UNFRACTIONATED: 0.27 [IU]/mL — AB (ref 0.30–0.70)
HEPARIN UNFRACTIONATED: 0.68 [IU]/mL (ref 0.30–0.70)
Heparin Unfractionated: 0.33 IU/mL (ref 0.30–0.70)
Heparin Unfractionated: 0.58 IU/mL (ref 0.30–0.70)

## 2018-02-24 MED ORDER — DOCUSATE SODIUM 100 MG PO CAPS
100.0000 mg | ORAL_CAPSULE | Freq: Two times a day (BID) | ORAL | Status: DC
  Administered 2018-02-25 – 2018-03-01 (×5): 100 mg via ORAL
  Filled 2018-02-24 (×9): qty 1

## 2018-02-24 MED ORDER — HEPARIN BOLUS VIA INFUSION
1600.0000 [IU] | Freq: Once | INTRAVENOUS | Status: AC
Start: 2018-02-24 — End: 2018-02-24
  Administered 2018-02-24: 1600 [IU] via INTRAVENOUS
  Filled 2018-02-24: qty 1600

## 2018-02-24 MED ORDER — DEXMEDETOMIDINE HCL IN NACL 400 MCG/100ML IV SOLN
0.4000 ug/kg/h | INTRAVENOUS | Status: DC
  Administered 2018-02-24 (×2): 1 ug/kg/h via INTRAVENOUS
  Administered 2018-02-24: 1.2 ug/kg/h via INTRAVENOUS
  Administered 2018-02-24: 0.8 ug/kg/h via INTRAVENOUS
  Administered 2018-02-25: 0.4 ug/kg/h via INTRAVENOUS
  Administered 2018-02-25: 1.2 ug/kg/h via INTRAVENOUS
  Administered 2018-02-25: 0.6 ug/kg/h via INTRAVENOUS
  Administered 2018-02-25: 0.8 ug/kg/h via INTRAVENOUS
  Administered 2018-02-25: 0.6 ug/kg/h via INTRAVENOUS
  Administered 2018-02-25: 0.4 ug/kg/h via INTRAVENOUS
  Administered 2018-02-25: 1 ug/kg/h via INTRAVENOUS
  Filled 2018-02-24 (×9): qty 100

## 2018-02-24 MED ORDER — MIDAZOLAM HCL 2 MG/2ML IJ SOLN
1.0000 mg | INTRAMUSCULAR | Status: DC | PRN
  Administered 2018-02-25 (×2): 2 mg via INTRAVENOUS
  Administered 2018-02-25 (×2): 1 mg via INTRAVENOUS
  Filled 2018-02-24 (×5): qty 2

## 2018-02-24 MED ORDER — QUETIAPINE FUMARATE 25 MG PO TABS: 50.0000 mg | ORAL_TABLET | Freq: Once | ORAL | Status: DC

## 2018-02-24 MED ORDER — BISACODYL 10 MG RE SUPP: 10.0000 mg | Freq: Every day | RECTAL | Status: DC | PRN

## 2018-02-24 MED ORDER — QUETIAPINE FUMARATE 25 MG PO TABS
50.0000 mg | ORAL_TABLET | Freq: Once | ORAL | Status: AC
  Administered 2018-02-24: 50 mg via ORAL
  Filled 2018-02-24: qty 2

## 2018-02-24 MED ORDER — QUETIAPINE FUMARATE 25 MG PO TABS
100.0000 mg | ORAL_TABLET | Freq: Every day | ORAL | Status: DC
  Administered 2018-02-25 – 2018-03-02 (×6): 100 mg via ORAL
  Filled 2018-02-24 (×2): qty 4
  Filled 2018-02-24: qty 1
  Filled 2018-02-24 (×3): qty 4

## 2018-02-24 NOTE — Progress Notes (Signed)
Placed on high fowlers position, cuff deflated, suctioned orally and endotracheally and then extubated to 4 lpm o2 [place]

## 2018-02-24 NOTE — Progress Notes (Addendum)
0840 Very agitated and trying to get out of bed. Redirected by family and nurse since 0840 extubation to stay in the bed and rest. Unable to reason or keep patient in bed. Given 1mg  of Versed to control patient. Dr. Mortimer Fries notified.

## 2018-02-24 NOTE — Progress Notes (Signed)
ANTICOAGULATION CONSULT NOTE - Initial Consult  Pharmacy Consult for heparin gtt Indication: pulmonary embolus  Allergies  Allergen Reactions  . Hydrocodone Rash  . Vicodin [Hydrocodone-Acetaminophen] Hives and Rash    Severe headaches (also)    Patient Measurements: Weight: 251 lb 12.3 oz (114.2 kg) Heparin Dosing Weight: 104kg  Vital Signs: Temp: 98.3 F (36.8 C) (03/06 1200) BP: 101/46 (03/06 1200) Pulse Rate: 71 (03/06 1200)  Labs: Recent Labs    02/21/18 1720  02/22/18 0452  02/22/18 1035 02/22/18 1152  02/23/18 0144  02/24/18 0004 02/24/18 0506 02/24/18 1256  HGB  --    < > 13.1  --   --   --   --  12.1*  --   --  11.5*  --   HCT  --   --  40.7  --   --   --   --  38.9*  --   --  36.0*  --   PLT  --   --  104*  --   --   --   --  102*  --   --  108*  --   APTT  --   --   --   --  >160*  --   --   --   --   --   --   --   HEPARINUNFRC  --    < >  --   --  1.09*  --    < > 0.29*   < > 0.33 0.27* 0.68  CREATININE 2.65*  2.69*   < > 1.73*   < >  --  1.56*  --  2.39*  --   --  2.03*  --   TROPONINI 0.07*  --   --   --   --   --   --   --   --   --   --   --    < > = values in this interval not displayed.    Estimated Creatinine Clearance: 54.6 mL/min (A) (by C-G formula based on SCr of 2.03 mg/dL (H)).   Medical History: Past Medical History:  Diagnosis Date  . Depression   . DVT (deep venous thrombosis) ([place])   . GERD (gastroesophageal reflux disease)   . Hyperlipidemia   . Hypertension   . Lupus   . Pulmonary embolism ([place])   . Renal disorder    Stage III    Assessment: 57 year old male with PE requiring anticoagulation via heparin gtt for PE, per pharmacy protocol.  Goal of Therapy:  Heparin level 0.3-0.7 units/ml Monitor platelets by anticoagulation protocol: Yes   Plan:  03/04 @ 0330 HL 1.04 supratherapeutic. Will hold heparin level for 1 hour and will restart @ 0430 @ 1100 units/hr and will recheck HL @ 1100. CBC check w/ am labs.  3/4 @  1035 HL 1.09 supratherapeutic. Will hold heparin level for 1 hour and will restart at 1300 @ 800 units/hr and will recheck HL @ 1900.   3/4 @1925  HL therapeutic at 0.48. Will continue current rate of heparin 800 units/hr and recheck HL in 6 hours.   3/5  0130 heparin level 0.29. 1600 unit bolus and increase rate to 1000 units/hr. Recheck in 6 hours.  3/5 @ 1220 HL 0.36. Will continue heparin infusion at current rate and recheck a HL in 6 hours.   3/5 @ 1702 HL 0.20. Will order 2000 u boulus and increase infusion to 1200 u/hr. Recheck HL  in 6 hours.   03/06 0000 HL 0.33. Recheck with AM labs to confirm.  03/06 AM heparin level 0.27. 1600 unit bolus and increase rate to 1400 units/hr. Recheck in 6 hours.  3/6 @ 1415 HL 0.68. Conitnue heparin infusion at current rate and check a confirmatory level in 6 hours.   Napoleon Form, PharmD, BCPS Clinical Pharmacist 02/24/2018 2:14 PM

## 2018-02-24 NOTE — Progress Notes (Signed)
0730 Dr. Mortimer Fries in to assess patient . Sedation stopped for weaning trial. Explained to patient what was happening and the game plan for weaning.

## 2018-02-24 NOTE — Progress Notes (Signed)
[facility] NAME: Jonathon Snow    MR#:  [id number]  DATE OF BIRTH:  [birth year]  SUBJECTIVE:  CHIEF COMPLAINT:   Chief Complaint  Patient presents with  . Unresponsive  For extubation later today and discussion with intensivist  REVIEW OF SYSTEMS:  CONSTITUTIONAL: No fever, fatigue or weakness.  EYES: No blurred or double vision.  EARS, NOSE, AND THROAT: No tinnitus or ear pain.  RESPIRATORY: No cough, shortness of breath, wheezing or hemoptysis.  CARDIOVASCULAR: No chest pain, orthopnea, edema.  GASTROINTESTINAL: No nausea, vomiting, diarrhea or abdominal pain.  GENITOURINARY: No dysuria, hematuria.  ENDOCRINE: No polyuria, nocturia,  HEMATOLOGY: No anemia, easy bruising or bleeding SKIN: No rash or lesion. MUSCULOSKELETAL: No joint pain or arthritis.   NEUROLOGIC: No tingling, numbness, weakness.  PSYCHIATRY: No anxiety or depression.   ROS  DRUG ALLERGIES:   Allergies  Allergen Reactions  . Hydrocodone Rash  . Vicodin [Hydrocodone-Acetaminophen] Hives and Rash    Severe headaches (also)    VITALS:  Blood pressure (!) 100/50, pulse 70, temperature 98.1 F (36.7 C), resp. rate 12, weight 114.2 kg (251 lb 12.3 oz), SpO2 99 %.  PHYSICAL EXAMINATION:  GENERAL:  57 y.o.-year-old patient lying in the bed with no acute distress.  EYES: Pupils equal, round, reactive to light and accommodation. No scleral icterus. Extraocular muscles intact.  HEENT: Head atraumatic, normocephalic. Oropharynx and nasopharynx clear.  NECK:  Supple, no jugular venous distention. No thyroid enlargement, no tenderness.  LUNGS: Normal breath sounds bilaterally, no wheezing, rales,rhonchi or crepitation. No use of accessory muscles of respiration.  CARDIOVASCULAR: S1, S2 normal. No murmurs, rubs, or gallops.  ABDOMEN: Soft, nontender, nondistended. Bowel sounds present. No organomegaly or mass.  EXTREMITIES: No pedal edema, cyanosis, or clubbing.   NEUROLOGIC: Cranial nerves II through XII are intact. Muscle strength 5/5 in all extremities. Sensation intact. Gait not checked.  PSYCHIATRIC: The patient is alert and oriented x 3.  SKIN: No obvious rash, lesion, or ulcer.   Physical Exam LABORATORY PANEL:   CBC Recent Labs  Lab 02/24/18 0506  WBC 19.1*  HGB 11.5*  HCT 36.0*  PLT 108*   ------------------------------------------------------------------------------------------------------------------  Chemistries  Recent Labs  Lab 02/21/18 0543  02/23/18 0144 02/24/18 0506  NA 140   < > 139 141  K 4.2   < > 3.9 3.3*  CL 110   < > 99* 96*  CO2 20*   < > 28 33*  GLUCOSE 147*   < > 151* 142*  BUN 31*   < > 43* 69*  CREATININE 2.14*   < > 2.39* 2.03*  CALCIUM 7.8*   < > 8.0* 8.0*  MG  --    < > 2.1  --   AST 29  --   --   --   ALT 21  --   --   --   ALKPHOS 81  --   --   --   BILITOT 0.4  --   --   --    < > = values in this interval not displayed.   ------------------------------------------------------------------------------------------------------------------  Cardiac Enzymes Recent Labs  Lab 02/21/18 1209 02/21/18 1720  TROPONINI 0.06* 0.07*   ------------------------------------------------------------------------------------------------------------------  RADIOLOGY:  No results found.  ASSESSMENT AND PLAN:  57 year old male patient with history of DVT, pulmonary embolism, CKD stage III, lupus erythematosus presented to the emergency room with decreased responsiveness.  Admitting diagnosis 1.Acute hypoxic respiratory failure  Stable  Secondary  to pneumonia For possible extubation later today in discussion with intensivist   2.Acute encephalopathy/altered mental status Most likely secondary to pneumonia with associated acute hypoxic respiratory failure Improved  3.Acute aspiration pneumonia Resolving Continue empiric antibiotics on our pneumonia protocol  4.  Acute septic shock   Resolving Secondary to acute pneumonia Continue pressors with weaning as tolerated, antibiotics per above, sepsis protocol  5.ARF/AKI w/ CKD III Improved  Secondary to ATN from acute illness  Treated with CRRT-discontinued on March 25, 2018 Nephrology input greatly appreciated  6.Lupus erythematosus Stable  7.History of DVT and pulmonary embolism On heparin drip, Eliquis on hold  All the records are reviewed and case discussed with Care Management/Social Workerr. Management plans discussed with the patient, family and they are in agreement.  CODE STATUS: full  TOTAL TIME TAKING CARE OF THIS PATIENT: 35 minutes.     POSSIBLE D/C IN 2-5 DAYS, DEPENDING ON CLINICAL CONDITION.   Avel Peace Brieanne Mignone M.D on 02/24/2018   Between 7am to 6pm - Pager - [phone]  After 6pm go to [email or web address] - password [email or web address] [facility]  Office  [phone]  CC: Primary care physician; Arnetha Courser, MD  Note: This dictation was prepared with Dragon dictation along with smaller phrase technology. Any transcriptional errors that result from this process are unintentional.

## 2018-02-24 NOTE — Consult Note (Signed)
Psychiatry: Consult received, chart reviewed.  Spoke for a while with the nurse on duty who is familiar with the patient's situation.  Consult was requested for capacity and suicidal statements.  Looking through the chart it appears that there is some family dynamic issues around power of attorney and decision making.  The patient has a past history of depression treated by primary care doctor but no known history of psychiatric hospitalization.  His current hospitalization revealed that he had been probably abusing opiates.  Patient was intubated until today.  Reported by the nurse shortly after he was extubated he became extremely agitated started thrashing and punching and trying to get out of bed.  He is now on a Precedex drip and once again sedated making direct evaluation and possible.  I will try to follow up tomorrow.

## 2018-02-24 NOTE — Progress Notes (Addendum)
ANTICOAGULATION CONSULT NOTE - Initial Consult  Pharmacy Consult for heparin gtt Indication: pulmonary embolus  Allergies  Allergen Reactions  . Hydrocodone Rash  . Vicodin [Hydrocodone-Acetaminophen] Hives and Rash    Severe headaches (also)    Patient Measurements: Weight: 250 lb 14.1 oz (113.8 kg) Heparin Dosing Weight: 104kg  Vital Signs: Temp: 98.3 F (36.8 C) (03/06 0000) Temp Source: Axillary (03/06 0000) BP: 132/70 (03/06 0230) Pulse Rate: 69 (03/06 0230)  Labs: Recent Labs    02/21/18 0252 02/21/18 0543 02/21/18 1209  02/21/18 1720  02/22/18 0452 02/22/18 0817 02/22/18 1035 02/22/18 1152  02/23/18 0144 02/23/18 1000 02/23/18 1644 02/24/18 0004  HGB 13.5 14.6  --   --   --   --  13.1  --   --   --   --  12.1*  --   --   --   HCT 42.8 46.2  --   --   --   --  40.7  --   --   --   --  38.9*  --   --   --   PLT 193 153  --   --   --   --  104*  --   --   --   --  102*  --   --   --   APTT <24*  --   --   --   --   --   --   --  >160*  --   --   --   --   --   --   LABPROT 12.9  --   --   --   --   --   --   --   --   --   --   --   --   --   --   INR 0.98  --   --   --   --   --   --   --   --   --   --   --   --   --   --   HEPARINUNFRC  --   --   --    < >  --    < >  --   --  1.09*  --    < > 0.29* 0.36 0.20* 0.33  CREATININE 2.51* 2.14*  --   --  2.65*  2.69*   < > 1.73* 1.63*  --  1.56*  --  2.39*  --   --   --   TROPONINI <0.03 0.03* 0.06*  --  0.07*  --   --   --   --   --   --   --   --   --   --    < > = values in this interval not displayed.    Estimated Creatinine Clearance: 46.3 mL/min (A) (by C-G formula based on SCr of 2.39 mg/dL (H)).   Medical History: Past Medical History:  Diagnosis Date  . Depression   . DVT (deep venous thrombosis) ([place])   . GERD (gastroesophageal reflux disease)   . Hyperlipidemia   . Hypertension   . Lupus   . Pulmonary embolism ([place])   . Renal disorder    Stage III    Assessment: 57 year old male with  PE requiring anticoagulation via heparin gtt for PE, per pharmacy protocol.  Goal of Therapy:  Heparin level 0.3-0.7 units/ml Monitor platelets by anticoagulation protocol: Yes  Plan:  03/04 @ 0330 HL 1.04 supratherapeutic. Will hold heparin level for 1 hour and will restart @ 0430 @ 1100 units/hr and will recheck HL @ 1100. CBC check w/ am labs.  3/4 @ 1035 HL 1.09 supratherapeutic. Will hold heparin level for 1 hour and will restart at 1300 @ 800 units/hr and will recheck HL @ 1900.   3/4 @1925  HL therapeutic at 0.48. Will continue current rate of heparin 800 units/hr and recheck HL in 6 hours.   3/5  0130 heparin level 0.29. 1600 unit bolus and increase rate to 1000 units/hr. Recheck in 6 hours.  3/5 @ 1220 HL 0.36. Will continue heparin infusion at current rate and recheck a HL in 6 hours.   3/5 @ 1702 HL 0.20. Will order 2000 u boulus and increase infusion to 1200 u/hr. Recheck HL in 6 hours.   03/06 0000 HL 0.33. Recheck with AM labs to confirm.  03/06 AM heparin level 0.27. 1600 unit bolus and increase rate to 1400 units/hr. Recheck in 6 hours.  Eloise Harman, PharmD, BCPS Clinical Pharmacist 02/24/2018 2:49 AM

## 2018-02-24 NOTE — Progress Notes (Signed)
Interesting day. Extubated this am without issues. Oriented to family and place only. Cooperative for the first 2 hours passed extubation. Attempted to get out of bed but quickly resumed calm cooperative attitude with encouragement from nurse and 1 mg of Midazolam IVP. Became more verbally abusive and demanding as the morning continued. Attempting OOB x 3 more times before lightly sedated again with Midazolam 1 mg IVP. This dose of Midazolam did not calm patient at all. Left thumb IV pulled out by patient. Being anticoagulated thumb bled all over patients gown and groin area. Patient became more agitated. Patient was beyond reasoning and physically held in bed by nurse. Precedex drip started to help control patient per Dr.Kasa.  Supervision of patient was maintained until he calmed and was able to control self. Then patient slept most of afternoon but aroused easily. Patient was bathed again to remove old blood from bed and skin,gown changed. Family very upset about patients outburst. Talked in detail to Dr. Mortimer Fries about treatment for drug abuse. Psychiatric consult requested per Dr. Mortimer Fries.

## 2018-02-24 NOTE — Progress Notes (Signed)
ANTICOAGULATION CONSULT NOTE - Initial Consult  Pharmacy Consult for heparin gtt Indication: pulmonary embolus  Allergies  Allergen Reactions  . Hydrocodone Rash  . Vicodin [Hydrocodone-Acetaminophen] Hives and Rash    Severe headaches (also)    Patient Measurements: Weight: 251 lb 12.3 oz (114.2 kg) Heparin Dosing Weight: 104kg  Vital Signs: Temp: 99.4 F (37.4 C) (03/06 1944) Temp Source: Axillary (03/06 1944) BP: 140/67 (03/06 2000) Pulse Rate: 78 (03/06 2000)  Labs: Recent Labs    02/22/18 0452  02/22/18 1035 02/22/18 1152  02/23/18 0144  02/24/18 0506 02/24/18 1256 02/24/18 1952  HGB 13.1  --   --   --   --  12.1*  --  11.5*  --   --   HCT 40.7  --   --   --   --  38.9*  --  36.0*  --   --   PLT 104*  --   --   --   --  102*  --  108*  --   --   APTT  --   --  >160*  --   --   --   --   --   --   --   HEPARINUNFRC  --   --  1.09*  --    < > 0.29*   < > 0.27* 0.68 0.58  CREATININE 1.73*   < >  --  1.56*  --  2.39*  --  2.03*  --   --    < > = values in this interval not displayed.    Estimated Creatinine Clearance: 54.6 mL/min (A) (by C-G formula based on SCr of 2.03 mg/dL (H)).   Medical History: Past Medical History:  Diagnosis Date  . Depression   . DVT (deep venous thrombosis) ([place])   . GERD (gastroesophageal reflux disease)   . Hyperlipidemia   . Hypertension   . Lupus   . Pulmonary embolism ([place])   . Renal disorder    Stage III    Assessment: 57 year old male with PE requiring anticoagulation via heparin gtt for PE, per pharmacy protocol.  Goal of Therapy:  Heparin level 0.3-0.7 units/ml Monitor platelets by anticoagulation protocol: Yes   Plan:  03/04 @ 0330 HL 1.04 supratherapeutic. Will hold heparin level for 1 hour and will restart @ 0430 @ 1100 units/hr and will recheck HL @ 1100. CBC check w/ am labs.  3/4 @ 1035 HL 1.09 supratherapeutic. Will hold heparin level for 1 hour and will restart at 1300 @ 800 units/hr and will recheck  HL @ 1900.   3/4 @1925  HL therapeutic at 0.48. Will continue current rate of heparin 800 units/hr and recheck HL in 6 hours.   3/5  0130 heparin level 0.29. 1600 unit bolus and increase rate to 1000 units/hr. Recheck in 6 hours.  3/5 @ 1220 HL 0.36. Will continue heparin infusion at current rate and recheck a HL in 6 hours.   3/5 @ 1702 HL 0.20. Will order 2000 u boulus and increase infusion to 1200 u/hr. Recheck HL in 6 hours.   03/06 0000 HL 0.33. Recheck with AM labs to confirm.  03/06 AM heparin level 0.27. 1600 unit bolus and increase rate to 1400 units/hr. Recheck in 6 hours.  3/6 @ 1415 HL 0.68. Conitnue heparin infusion at current rate and check a confirmatory level in 6 hours.   3/6 HL @ 19:00 = 0.58   Will continue this pt on current rate and recheck  HL on 3/7 with AM labs.   [place] Pharmacist 02/24/2018 9:26 PM

## 2018-02-24 NOTE — Telephone Encounter (Signed)
Copied from [place]. Topic: General - Other >> Feb 22, 2018  3:43 PM [name], NT wrote: Reason for CRM: Patients daughter Carvel Getting  called and said he is inpatient  in the ICU , at [facility] , he is being treated by Dr Rich Number  he has told the familythat  the lupus is shutting down his organs it has started with his kidney,he is currently on dialysis he is putting out a little urine on his own .please call her at [phone] He also has PNA in his right lung  ,  >> Feb 24, 2018  2:34 PM Ivar Drape wrote: The daughter, Carvel Getting wanted the provider to know that the hospital is suspecting her dad of drug abuse.  She needs directions to get her dad help. Please call.

## 2018-02-24 NOTE — Progress Notes (Signed)
[name]  ROUNDING NOTE   Subjective:  Patient extubated this a.m. Urine output was 3.2 L over the preceding 24 hours. Overall doing better.   Objective:  Vital signs in last 24 hours:  Temp:  [98 F (36.7 C)-98.3 F (36.8 C)] 98.1 F (36.7 C) (03/06 0800) Pulse Rate:  [62-101] 70 (03/06 1000) Resp:  [12-24] 12 (03/06 0900) BP: (80-169)/(38-85) 100/50 (03/06 1000) SpO2:  [90 %-99 %] 99 % (03/06 1123) FiO2 (%):  [40 %] 40 % (03/06 0815) Weight:  [114.2 kg (251 lb 12.3 oz)] 114.2 kg (251 lb 12.3 oz) (03/06 0447)  Weight change: 0.4 kg (14.1 oz) Filed Weights   02/22/18 0456 02/23/18 0433 02/24/18 0447  Weight: 114.2 kg (251 lb 12.3 oz) 113.8 kg (250 lb 14.1 oz) 114.2 kg (251 lb 12.3 oz)    Intake/Output: I/O last 3 completed shifts: In: 4410.2 [I.V.:3505.5; NG/GT:654.7; IV Piggyback:250] Out: 0263 [Urine:4040]   Intake/Output this shift:  Total I/O In: 400.6 [I.V.:280.6; NG/GT:120] Out: 1150 [Urine:1150]  Physical Exam: General: No acute distress  Head: Hearing intact, OM moist  Eyes: Anicteric  Neck: right IJ triple lumen  Lungs:  Bilateral rhonchi, normal effort  Heart: Regular rate and rhythm  Abdomen:  Soft, nontender, BS present  Extremities: 1+ peripheral edema.  Neurologic: Awake, alert, follows commands  Skin: No lesions  Access: Right femoral temp HD catheter Dr. Mortimer Fries 3/3    Basic Metabolic Panel: Recent Labs  Lab 02/21/18 2150 02/22/18 0048 02/22/18 0452 02/22/18 0817 02/22/18 1152 02/23/18 0144 02/24/18 0506  NA 140 140 138 139 139 139 141  K 5.4* 4.7 4.3 4.1 4.0 3.9 3.3*  CL 107 107 104 103 100* 99* 96*  CO2 23 25 25 27 29 28  33*  GLUCOSE 208* 213* 189* 215* 174* 151* 142*  BUN 34* 32* 31* 27* 25* 43* 69*  CREATININE 2.20* 1.78* 1.73* 1.63* 1.56* 2.39* 2.03*  CALCIUM 7.5* 7.4* 7.4* 7.5* 7.9* 8.0* 8.0*  MG  --  1.5* 2.8* 2.2 2.1 2.1  --   PHOS 3.8 3.4 3.5 3.1 2.6  --   --     Liver Function Tests: Recent Labs  Lab  02/21/18 0252 02/21/18 0543  02/21/18 2150 02/22/18 0048 02/22/18 0452 02/22/18 0817 02/22/18 1152  AST 30 29  --   --   --   --   --   --   ALT 18 21  --   --   --   --   --   --   ALKPHOS 79 81  --   --   --   --   --   --   BILITOT 0.6 0.4  --   --   --   --   --   --   PROT 6.4* 6.4*  --   --   --   --   --   --   ALBUMIN 3.4* 3.2*   < > 2.8* 2.8* 2.7* 2.7* 2.7*   < > = values in this interval not displayed.   Recent Labs  Lab 02/21/18 0252  LIPASE 56*   No results for input(s): AMMONIA in the last 168 hours.  CBC: Recent Labs  Lab 02/21/18 0252 02/21/18 0543 02/22/18 0452 02/23/18 0144 02/24/18 0506  WBC 8.6 3.9 15.5* 23.3* 19.1*  NEUTROABS 6.7*  --   --   --   --   HGB 13.5 14.6 13.1 12.1* 11.5*  HCT 42.8 46.2 40.7 38.9* 36.0*  MCV 92.2 91.8 90.8 90.4 88.9  PLT 193 153 104* 102* 108*    Cardiac Enzymes: Recent Labs  Lab 02/21/18 0252 02/21/18 0543 02/21/18 1209 02/21/18 1720  TROPONINI <0.03 0.03* 0.06* 0.07*    BNP: Invalid input(s): POCBNP  CBG: Recent Labs  Lab 02/23/18 1941 02/23/18 2325 02/24/18 0349 02/24/18 0736 02/24/18 1150  GLUCAP 132* 151* 153* 171* 120*    Microbiology: Results for orders placed or performed during the hospital encounter of 02/21/18  MRSA PCR Screening     Status: None   Collection Time: 02/21/18  4:23 AM  Result Value Ref Range Status   MRSA by PCR NEGATIVE NEGATIVE Final    Comment:        The GeneXpert MRSA Assay (FDA approved for NASAL specimens only), is one component of a comprehensive MRSA colonization surveillance program. It is not intended to diagnose MRSA infection nor to guide or monitor treatment for MRSA infections. Performed at [facility], [address] [place], [place] [address]     Coagulation Studies: No results for input(s): LABPROT, INR in the last 72 hours.  Urinalysis: No results for input(s): COLORURINE, LABSPEC, PHURINE, GLUCOSEU, HGBUR, BILIRUBINUR,  KETONESUR, PROTEINUR, UROBILINOGEN, NITRITE, LEUKOCYTESUR in the last 72 hours.  Invalid input(s): APPERANCEUR    Imaging: No results found.   Medications:   . dexmedetomidine (PRECEDEX) IV infusion    . heparin 1,400 Units/hr (02/24/18 9485)  . norepinephrine (LEVOPHED) Adult infusion 2 mcg/min (02/24/18 1215)  . phenylephrine (NEO-SYNEPHRINE) Adult infusion    . piperacillin-tazobactam (ZOSYN)  IV 3.375 g (02/24/18 1222)  . pureflow 3 each (02/22/18 0925)  .  sodium bicarbonate (isotonic) infusion in sterile water 50 mL/hr at 02/24/18 0500  . vasopressin (PITRESSIN) infusion - *FOR SHOCK* Stopped (02/22/18 4627)   . budesonide (PULMICORT) nebulizer solution  0.5 mg Nebulization BID  . docusate sodium  100 mg Oral BID  . furosemide  40 mg Intravenous Q12H  . insulin aspart  0-15 Units Subcutaneous Q4H  . ipratropium-albuterol  3 mL Nebulization Q4H  . mouth rinse  15 mL Mouth Rinse 10 times per day  . methylPREDNISolone (SOLU-MEDROL) injection  40 mg Intravenous Q12H  . pantoprazole (PROTONIX) IV  40 mg Intravenous Q24H  . [START ON 02/25/2018] QUEtiapine  100 mg Oral QHS  . QUEtiapine  50 mg Oral Once  . sertraline  50 mg Oral Daily  . sodium chloride flush  10-40 mL Intracatheter Q12H  . vecuronium  10 mg Intravenous Once   bisacodyl, fentaNYL, hydrALAZINE, midazolam, ondansetron **OR** ondansetron (ZOFRAN) IV, sodium chloride flush  Assessment/ Plan:  Mr. STANISLAW ACTON is a 57 y.o. 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 Intubated and sedated.   1. Acute Renal Failure on chronic kidney disease stage III: baseline creatinine of 1.75, GFR of 44 on 10/2017.  Chronic kidney disease stage III with hematuria and proteinuria secondary to lupus nephritis.  Currently off cellcept.   Acute renal failure from ATN from acute illness, prerenal azotemia and hypoxia - patient appears to be  doing well off of continuous renal placement therapy.  Urine output was 3.2 L over the preceding 24 hours.  We will request nursing to remove temporary dialysis catheter.  2. Acute respiratory failure requiring mechanical ventilation: has been extubated.  Breathing comfortably at the moment.  Continue to monitor.  3.  Hypotension.  Patient on minimal norepinephrine now.  Continue to wean down. LOS:  [place], Samvel Zinn 3/6/20191:31 PM

## 2018-02-24 NOTE — Telephone Encounter (Signed)
I called patient's daughter Explained that I've never seen him She mentioned heroin Lupus is shutting his organs down Did dialysis They told her that he has track marks, continuous needle use they told her "That's not my dad" she says, but he's got a young girlfriend who stays at the house and she's a heroin user His mother died in Mar 03, 2016; he had a hard time and she still has a hard time sometimes; her death was a shock to him; patient lost his father 20-30 years ago He has struggled with depression; the lupus has been getting him down Her uncle went into a diabetic coma in [place] and daughter wasn't there for a while; he met this young girl and it just snowballed he thinks She doesn't know what to think; he was combative and acting crazy Hard to accept all this she says She did speak with a chaplain at the hospital

## 2018-02-24 NOTE — Telephone Encounter (Signed)
Pt daughter wanted to make you aware of situation  Copied from [place]. Topic: General - Other >> Feb 22, 2018  3:43 PM [name], NT wrote: Reason for CRM: Patients daughter Jonathon Snow  called and said he is inpatient  in the ICU , at [facility] , he is being treated by Dr Rich Number  he has told the familythat  the lupus is shutting down his organs it has started with his kidney,he is currently on dialysis he is putting out a little urine on his own .please call her at [phone] He also has PNA in his right lung  ,  >> Feb 24, 2018  2:34 PM Ivar Drape wrote: The daughter, Jonathon Snow wanted the provider to know that the hospital is suspecting her dad of drug abuse.  She needs directions to get her dad help. Please call.

## 2018-02-25 DIAGNOSIS — R451 Restlessness and agitation: Secondary | ICD-10-CM

## 2018-02-25 LAB — CBC
HEMATOCRIT: 38.1 % — AB (ref 40.0–52.0)
Hemoglobin: 12.4 g/dL — ABNORMAL LOW (ref 13.0–18.0)
MCH: 29 pg (ref 26.0–34.0)
MCHC: 32.6 g/dL (ref 32.0–36.0)
MCV: 88.9 fL (ref 80.0–100.0)
Platelets: 107 10*3/uL — ABNORMAL LOW (ref 150–440)
RBC: 4.29 MIL/uL — AB (ref 4.40–5.90)
RDW: 14.7 % — ABNORMAL HIGH (ref 11.5–14.5)
WBC: 12.9 10*3/uL — AB (ref 3.8–10.6)

## 2018-02-25 LAB — BASIC METABOLIC PANEL
ANION GAP: 12 (ref 5–15)
BUN: 85 mg/dL — ABNORMAL HIGH (ref 6–20)
CHLORIDE: 95 mmol/L — AB (ref 101–111)
CO2: 37 mmol/L — AB (ref 22–32)
Calcium: 8.3 mg/dL — ABNORMAL LOW (ref 8.9–10.3)
Creatinine, Ser: 2 mg/dL — ABNORMAL HIGH (ref 0.61–1.24)
GFR calc Af Amer: 41 mL/min — ABNORMAL LOW (ref >60)
GFR calc non Af Amer: 36 mL/min — ABNORMAL LOW (ref >60)
Glucose, Bld: 157 mg/dL — ABNORMAL HIGH (ref 65–99)
POTASSIUM: 3.7 mmol/L (ref 3.5–5.1)
Sodium: 144 mmol/L (ref 135–145)

## 2018-02-25 LAB — GLUCOSE, CAPILLARY
GLUCOSE-CAPILLARY: 120 mg/dL — AB (ref 65–99)
GLUCOSE-CAPILLARY: 122 mg/dL — AB (ref 65–99)
Glucose-Capillary: 155 mg/dL — ABNORMAL HIGH (ref 65–99)
Glucose-Capillary: 150 mg/dL — ABNORMAL HIGH (ref 65–99)
Glucose-Capillary: 133 mg/dL — ABNORMAL HIGH (ref 65–99)
Glucose-Capillary: 130 mg/dL — ABNORMAL HIGH (ref 65–99)

## 2018-02-25 LAB — HEPARIN LEVEL (UNFRACTIONATED): Heparin Unfractionated: 0.56 IU/mL (ref 0.30–0.70)

## 2018-02-25 MED ORDER — APIXABAN 5 MG PO TABS
5.0000 mg | ORAL_TABLET | Freq: Two times a day (BID) | ORAL | Status: DC
  Administered 2018-02-25 – 2018-03-03 (×13): 5 mg via ORAL
  Filled 2018-02-25 (×13): qty 1

## 2018-02-25 MED ORDER — HEPARIN SODIUM (PORCINE) 5000 UNIT/ML IJ SOLN: 5000.0000 [IU] | Freq: Three times a day (TID) | INTRAMUSCULAR | Status: DC

## 2018-02-25 MED ORDER — METHYLPREDNISOLONE SODIUM SUCC 40 MG IJ SOLR
40.0000 mg | Freq: Every day | INTRAMUSCULAR | Status: DC
  Administered 2018-02-26 – 2018-02-28 (×3): 40 mg via INTRAVENOUS
  Filled 2018-02-25 (×3): qty 1

## 2018-02-25 MED ORDER — FUROSEMIDE 10 MG/ML IJ SOLN
20.0000 mg | Freq: Every day | INTRAMUSCULAR | Status: DC
  Administered 2018-02-26: 20 mg via INTRAVENOUS
  Filled 2018-02-25: qty 2

## 2018-02-25 MED ORDER — LORAZEPAM 2 MG/ML IJ SOLN
0.5000 mg | INTRAMUSCULAR | Status: DC | PRN
  Administered 2018-02-25: 1 mg via INTRAVENOUS
  Filled 2018-02-25: qty 1

## 2018-02-25 MED ORDER — IPRATROPIUM-ALBUTEROL 0.5-2.5 (3) MG/3ML IN SOLN
3.0000 mL | RESPIRATORY_TRACT | Status: DC | PRN
  Administered 2018-03-01 – 2018-03-03 (×3): 3 mL via RESPIRATORY_TRACT
  Filled 2018-02-25 (×3): qty 3

## 2018-02-25 NOTE — Progress Notes (Signed)
ANTICOAGULATION CONSULT NOTE - Initial Consult  Pharmacy Consult for heparin gtt Indication: pulmonary embolus  Allergies  Allergen Reactions  . Hydrocodone Rash  . Vicodin [Hydrocodone-Acetaminophen] Hives and Rash    Severe headaches (also)    Patient Measurements: Weight: 246 lb 7.6 oz (111.8 kg) Heparin Dosing Weight: 104kg  Vital Signs: Temp: 97.4 F (36.3 C) (03/07 0348) Temp Source: Oral (03/07 0348) BP: 167/83 (03/07 0600) Pulse Rate: 52 (03/07 0500)  Labs: Recent Labs    02/22/18 1035 02/22/18 1152  02/23/18 0144  02/24/18 0506 02/24/18 1256 02/24/18 1952 02/25/18 0351  HGB  --   --    < > 12.1*  --  11.5*  --   --  12.4*  HCT  --   --   --  38.9*  --  36.0*  --   --  38.1*  PLT  --   --   --  102*  --  108*  --   --  107*  APTT >160*  --   --   --   --   --   --   --   --   HEPARINUNFRC 1.09*  --    < > 0.29*   < > 0.27* 0.68 0.58 0.56  CREATININE  --  1.56*  --  2.39*  --  2.03*  --   --   --    < > = values in this interval not displayed.    Estimated Creatinine Clearance: 54 mL/min (A) (by C-G formula based on SCr of 2.03 mg/dL (H)).   Medical History: Past Medical History:  Diagnosis Date  . Depression   . DVT (deep venous thrombosis) ([place])   . GERD (gastroesophageal reflux disease)   . Hyperlipidemia   . Hypertension   . Lupus   . Pulmonary embolism ([place])   . Renal disorder    Stage III    Assessment: 57 year old male with PE requiring anticoagulation via heparin gtt for PE, per pharmacy protocol.  Goal of Therapy:  Heparin level 0.3-0.7 units/ml Monitor platelets by anticoagulation protocol: Yes   Plan:  03/04 @ 0330 HL 1.04 supratherapeutic. Will hold heparin level for 1 hour and will restart @ 0430 @ 1100 units/hr and will recheck HL @ 1100. CBC check w/ am labs.  3/4 @ 1035 HL 1.09 supratherapeutic. Will hold heparin level for 1 hour and will restart at 1300 @ 800 units/hr and will recheck HL @ 1900.   3/4 @1925  HL  therapeutic at 0.48. Will continue current rate of heparin 800 units/hr and recheck HL in 6 hours.   3/5  0130 heparin level 0.29. 1600 unit bolus and increase rate to 1000 units/hr. Recheck in 6 hours.  3/5 @ 1220 HL 0.36. Will continue heparin infusion at current rate and recheck a HL in 6 hours.   3/5 @ 1702 HL 0.20. Will order 2000 u boulus and increase infusion to 1200 u/hr. Recheck HL in 6 hours.   03/06 0000 HL 0.33. Recheck with AM labs to confirm.  03/06 AM heparin level 0.27. 1600 unit bolus and increase rate to 1400 units/hr. Recheck in 6 hours.  3/6 @ 1415 HL 0.68. Conitnue heparin infusion at current rate and check a confirmatory level in 6 hours.   3/6 HL @ 19:00 = 0.58   Will continue this pt on current rate and recheck HL on 3/7 with AM labs.   3/7 AM heparin level 0.56. Continue current regimen. Recheck heparin level  and CBC with tomorrow AM labs.  Sim Boast, PharmD, BCPS  02/25/18 7:12 AM      Luzelena Heeg S Clinical Pharmacist 02/25/2018 7:12 AM

## 2018-02-25 NOTE — Progress Notes (Signed)
Better day. Only tried to get out of bed 6 times. Unable to redirect patient to stay in bed. Sedated x 2 with Midazolam and Lorazapam.  Taking pills and water  without issues

## 2018-02-25 NOTE — Consult Note (Signed)
[facility] PATIENT NAME: Jonathon Snow   MR#:  [id number] DATE OF BIRTH:  [birth year] DATE OF ADMISSION:  02/21/2018 PRIMARY CARE PHYSICIAN: Arnetha Courser, MD  REQUESTING/REFERRING PHYSICIAN: Malinda  CHIEF COMPLAINT:   Chief Complaint  Patient presents with  . Unresponsive  resp failure  EVENTS 3/3 intubated 3/3 Vasc cath and  CVL placed, CRRT started, bicarb started 3/4 CRRT stopped fio2 at 80% 3/6 extubated  HISTORY OF PRESENT ILLNESS: Remains agitated On precedex Psych consult following     REVIEW OF SYSTEMS:  Agitated on precedex  VITAL SIGNS: Blood pressure (!) 178/97, pulse (!) 43, temperature (!) 97.4 F (36.3 C), temperature source Axillary, resp. rate (!) 9, weight 246 lb 7.6 oz (111.8 kg), SpO2 100 %.  PHYSICAL EXAMINATION:  GENERAL:critically ill appearing,  HEAD: Normocephalic, atraumatic.  EYES: Pupils equal, round, reactive to light.  No scleral icterus.  MOUTH: Moist mucosal membrane. NECK: Supple. No thyromegaly. No nodules. No JVD.  PULMONARY: +rhonchi,  CARDIOVASCULAR: S1 and S2. Regular rate and rhythm. No murmurs, rubs, or gallops.  GASTROINTESTINAL: Soft, nontender, -distended. No masses. Positive bowel sounds. No hepatosplenomegaly.  MUSCULOSKELETAL: No swelling, clubbing, or edema.  NEUROLOGIC: agitated SKIN:intact,warm,dry   LABORATORY PANEL:   CBC Recent Labs  Lab 02/21/18 0252 02/21/18 0543 02/22/18 0452 02/23/18 0144 02/24/18 0506 02/25/18 0351  WBC 8.6 3.9 15.5* 23.3* 19.1* 12.9*  HGB 13.5 14.6 13.1 12.1* 11.5* 12.4*  HCT 42.8 46.2 40.7 38.9* 36.0* 38.1*  PLT 193 153 104* 102* 108* 107*  MCV 92.2 91.8 90.8 90.4 88.9 88.9  MCH 29.0 29.0 29.3 28.2 28.5 29.0  MCHC 31.4* 31.6* 32.3 31.2* 32.0 32.6  RDW 15.3* 15.5* 15.0* 15.2* 14.8* 14.7*  LYMPHSABS 1.6  --   --   --   --   --   MONOABS 0.2  --   --   --   --   --   EOSABS 0.1  --   --   --   --   --   BASOSABS 0.1  --   --   --   --   --     ------------------------------------------------------------------------------------------------------------------  Chemistries  Recent Labs  Lab 02/21/18 0252 02/21/18 0543  02/22/18 0048 02/22/18 9476 02/22/18 0817 02/22/18 1152 02/23/18 0144 02/24/18 0506 02/25/18 0351  NA 139 140   < > 140 138 139 139 139 141 144  K 4.7 4.2   < > 4.7 4.3 4.1 4.0 3.9 3.3* 3.7  CL 105 110   < > 107 104 103 100* 99* 96* 95*  CO2 24 20*   < > 25 25 27 29 28  33* 37*  GLUCOSE 310* 147*   < > 213* 189* 215* 174* 151* 142* 157*  BUN 31* 31*   < > 32* 31* 27* 25* 43* 69* 85*  CREATININE 2.51* 2.14*   < > 1.78* 1.73* 1.63* 1.56* 2.39* 2.03* 2.00*  CALCIUM 8.4* 7.8*   < > 7.4* 7.4* 7.5* 7.9* 8.0* 8.0* 8.3*  MG  --   --    < > 1.5* 2.8* 2.2 2.1 2.1  --   --   AST 30 29  --   --   --   --   --   --   --   --   ALT 18 21  --   --   --   --   --   --   --   --   ALKPHOS 79 81  --   --   --   --   --   --   --   --  BILITOT 0.6 0.4  --   --   --   --   --   --   --   --    < > = values in this interval not displayed.   ------------------------ IMPRESSION AND PLAN: 57 year old male patient with history of DVT, pulmonary embolism, CKD stage III, lupus erythematosus presented to the emergency room with decreased responsiveness. Patient with severe resp failure ARDS/ALI with renal failure from aspiration pneumonia Patient succesfully extubated  1.Severe Respiratory Failure-resolving -oxygen as needed  2.Renal Failure-most likely due to ATN -follow chem 7 -follow UO -continue Foley Catheter Remove vasc cath Stop bicarb infusion  3..h/o DVT/PE -Started heparin infusion  4.agitation precedex infusion Started Seroquel Wean precedex as tolerated   Remains SD status    Sharnika Binney Patricia Pesa, M.D.  [facility]  Medical Director [place] Director [facility] Cardio-Pulmonary Department

## 2018-02-25 NOTE — Progress Notes (Signed)
Nutrition Follow-up  DOCUMENTATION CODES:   Obesity unspecified  INTERVENTION:  RD will continue to follow and monitor for diet advancement. Once diet able to be advanced patient will likely benefit from oral nutrition supplement to help meet calorie/protein needs until appetite returns to normal.  NUTRITION DIAGNOSIS:   Inadequate oral intake related to inability to eat as evidenced by NPO status.  Ongoing.  GOAL:   Patient will meet greater than or equal to 90% of their needs  Not met.  MONITOR:   Diet advancement, Labs, Weight trends, I & O's  REASON FOR ASSESSMENT:   Ventilator, Consult Enteral/tube feeding initiation and management, Assessment of nutrition requirement/status  ASSESSMENT:   57 year old male with PMHx of HTN, pulmonary embolism, DVT on Eliquis, HLD, lupus erythematosus, CKD stage III, GERD, depression who was brought in after being found unresponsive found to have severe respiratory failure with ARDS/ALI and was emergently intubated early AM 3/3, also with renal failure.   -Patient was started on CVVHD evening of 3/3 and was discontinued on 3/4 as UOP improved. -Patient was extubated on 3/6.  Patient currently sleeping on Precedex drip. Per discussion in rounds he was very agitated yesterday afternoon. When Precedex is decreased he is able to swallow PO meds with nursing supervision. Otherwise is requiring Precedex to stay calm so not appropriate for diet at this time.  Medications reviewed and include: Eliquis, Colace, Lasix 20 mg daily IV, Novolog 0-15 units Q4hrs, Solu-Medrol 40 mg daily IV, pantoprazole, Seroquel, sertraline, Precedex gtt, Zosyn.  Labs reviewed: CBG 133-160, Chloride 95, CO2 37, BUN 85, Creatinine 2.  I/O: 4800 mL UOP yesterday (1.8 mL/kg/hr)  Weight trend: 111.8 kg on 3/7; +0.4 kg from admission  Discussed with RN and on rounds.  Diet Order:  Diet NPO time specified  EDUCATION NEEDS:   No education needs have been  identified at this time  Skin:  Skin Assessment: Reviewed RN Assessment(scattered ecchymosis)  Last BM:  02/24/2018 - small type 2  Height:   Ht Readings from Last 1 Encounters:  02/02/18 6' 2"  (1.88 m)    Weight:   Wt Readings from Last 1 Encounters:  02/25/18 246 lb 7.6 oz (111.8 kg)    Ideal Body Weight:  86.4 kg(calculated with ht of 6' 2"  from previous encounter)  BMI:  Body mass index is 31.65 kg/m.  Estimated Nutritional Needs:   Kcal:  2200-2400 (MSJ x 1.1-1.2)  Protein:  100-120 grams (0.9-1.1 grams/kg)  Fluid:  2.1-2.5 L/day (25-30 mL/kg IBW)  Willey Blade, MS, RD, LDN Office: [phone] Pager: [phone] After Hours/Weekend Pager: [phone]

## 2018-02-25 NOTE — Progress Notes (Signed)
[name]  ROUNDING NOTE   Subjective:  Yesterday patient became agitated. He apparently was thrashing about. Thereafter he was placed back on sedation. This morning he is resting comfortably. Urine output was 4.8 L over the preceding 24 hours.  Objective:  Vital signs in last 24 hours:  Temp:  [97.4 F (36.3 C)-99.4 F (37.4 C)] 97.4 F (36.3 C) (03/07 0727) Pulse Rate:  [52-101] 52 (03/07 0500) Resp:  [12-18] 15 (03/07 0600) BP: (88-171)/(38-94) 167/83 (03/07 0600) SpO2:  [90 %-100 %] 97 % (03/07 0600) FiO2 (%):  [40 %] 40 % (03/06 0821) Weight:  [111.8 kg (246 lb 7.6 oz)] 111.8 kg (246 lb 7.6 oz) (03/07 0500)  Weight change: -2.4 kg (-4.7 oz) Filed Weights   02/23/18 0433 02/24/18 0447 02/25/18 0500  Weight: 113.8 kg (250 lb 14.1 oz) 114.2 kg (251 lb 12.3 oz) 111.8 kg (246 lb 7.6 oz)    Intake/Output: I/O last 3 completed shifts: In: 4156.1 [I.V.:3396.1; NG/GT:560; IV Piggyback:200] Out: 5277 [Urine:6880]   Intake/Output this shift:  Total I/O In: -  Out: 350 [Urine:350]  Physical Exam: General: No acute distress  Head: Hearing intact, OM moist  Eyes: Anicteric  Neck: right IJ triple lumen  Lungs:  Bilateral rhonchi, normal effort  Heart: Regular rate and rhythm  Abdomen:  Soft, nontender, BS present  Extremities: 1+ peripheral edema.  Neurologic: Sedated at the moment  Skin: No lesions  Access: Right femoral temp HD catheter Dr. Mortimer Fries 3/3    Basic Metabolic Panel: Recent Labs  Lab 02/21/18 2150 02/22/18 0048 02/22/18 0452 02/22/18 0817 02/22/18 1152 02/23/18 0144 02/24/18 0506 02/25/18 0351  NA 140 140 138 139 139 139 141 144  K 5.4* 4.7 4.3 4.1 4.0 3.9 3.3* 3.7  CL 107 107 104 103 100* 99* 96* 95*  CO2 23 25 25 27 29 28  33* 37*  GLUCOSE 208* 213* 189* 215* 174* 151* 142* 157*  BUN 34* 32* 31* 27* 25* 43* 69* 85*  CREATININE 2.20* 1.78* 1.73* 1.63* 1.56* 2.39* 2.03* 2.00*  CALCIUM 7.5* 7.4* 7.4* 7.5* 7.9* 8.0* 8.0* 8.3*  MG   --  1.5* 2.8* 2.2 2.1 2.1  --   --   PHOS 3.8 3.4 3.5 3.1 2.6  --   --   --     Liver Function Tests: Recent Labs  Lab 02/21/18 0252 02/21/18 0543  02/21/18 2150 02/22/18 0048 02/22/18 0452 02/22/18 0817 02/22/18 1152  AST 30 29  --   --   --   --   --   --   ALT 18 21  --   --   --   --   --   --   ALKPHOS 79 81  --   --   --   --   --   --   BILITOT 0.6 0.4  --   --   --   --   --   --   PROT 6.4* 6.4*  --   --   --   --   --   --   ALBUMIN 3.4* 3.2*   < > 2.8* 2.8* 2.7* 2.7* 2.7*   < > = values in this interval not displayed.   Recent Labs  Lab 02/21/18 0252  LIPASE 56*   No results for input(s): AMMONIA in the last 168 hours.  CBC: Recent Labs  Lab 02/21/18 0252 02/21/18 0543 02/22/18 0452 02/23/18 0144 02/24/18 0506 02/25/18 0351  WBC 8.6 3.9 15.5* 23.3*  19.1* 12.9*  NEUTROABS 6.7*  --   --   --   --   --   HGB 13.5 14.6 13.1 12.1* 11.5* 12.4*  HCT 42.8 46.2 40.7 38.9* 36.0* 38.1*  MCV 92.2 91.8 90.8 90.4 88.9 88.9  PLT 193 153 104* 102* 108* 107*    Cardiac Enzymes: Recent Labs  Lab 02/21/18 0252 02/21/18 0543 02/21/18 1209 02/21/18 1720  TROPONINI <0.03 0.03* 0.06* 0.07*    BNP: Invalid input(s): POCBNP  CBG: Recent Labs  Lab 02/24/18 1619 02/24/18 1928 02/24/18 2331 02/25/18 0337 02/25/18 0719  GLUCAP 154* 146* 160* 35* 150*    Microbiology: Results for orders placed or performed during the hospital encounter of 02/21/18  MRSA PCR Screening     Status: None   Collection Time: 02/21/18  4:23 AM  Result Value Ref Range Status   MRSA by PCR NEGATIVE NEGATIVE Final    Comment:        The GeneXpert MRSA Assay (FDA approved for NASAL specimens only), is one component of a comprehensive MRSA colonization surveillance program. It is not intended to diagnose MRSA infection nor to guide or monitor treatment for MRSA infections. Performed at [facility], [address] [place], [place] [address]     Coagulation  Studies: No results for input(s): LABPROT, INR in the last 72 hours.  Urinalysis: No results for input(s): COLORURINE, LABSPEC, PHURINE, GLUCOSEU, HGBUR, BILIRUBINUR, KETONESUR, PROTEINUR, UROBILINOGEN, NITRITE, LEUKOCYTESUR in the last 72 hours.  Invalid input(s): APPERANCEUR    Imaging: No results found.   Medications:   . dexmedetomidine (PRECEDEX) IV infusion 1 mcg/kg/hr (02/25/18 0807)  . heparin 1,400 Units/hr (02/25/18 0600)  . norepinephrine (LEVOPHED) Adult infusion Stopped (02/24/18 1808)  . phenylephrine (NEO-SYNEPHRINE) Adult infusion    . piperacillin-tazobactam (ZOSYN)  IV Stopped (02/25/18 1914)  . pureflow 3 each (02/22/18 0925)  .  sodium bicarbonate (isotonic) infusion in sterile water 50 mL/hr at 02/25/18 0807  . vasopressin (PITRESSIN) infusion - *FOR SHOCK* Stopped (02/22/18 7829)   . budesonide (PULMICORT) nebulizer solution  0.5 mg Nebulization BID  . docusate sodium  100 mg Oral BID  . furosemide  40 mg Intravenous Q12H  . insulin aspart  0-15 Units Subcutaneous Q4H  . ipratropium-albuterol  3 mL Nebulization Q4H  . mouth rinse  15 mL Mouth Rinse 10 times per day  . methylPREDNISolone (SOLU-MEDROL) injection  40 mg Intravenous Q12H  . pantoprazole (PROTONIX) IV  40 mg Intravenous Q24H  . QUEtiapine  100 mg Oral QHS  . QUEtiapine  50 mg Oral Once  . sertraline  50 mg Oral Daily  . sodium chloride flush  10-40 mL Intracatheter Q12H  . vecuronium  10 mg Intravenous Once   bisacodyl, fentaNYL, hydrALAZINE, midazolam, ondansetron **OR** ondansetron (ZOFRAN) IV, sodium chloride flush  Assessment/ Plan:  Mr. CORDE ANTONINI is a 57 y.o. 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 Intubated and sedated.   1. Acute Renal Failure on chronic kidney disease stage III: baseline creatinine of 1.75, GFR of 44 on 10/2017.  Chronic kidney disease stage III with hematuria and  proteinuria secondary to lupus nephritis.  Currently off cellcept.   Acute renal failure from ATN from acute illness, prerenal azotemia and hypoxia -BUN is rising suspect this is secondary to steroid use as well as diuretics.  Therefore we will decrease Lasix to 20 mg IV daily.  Patient was on CellCept as an outpatient.  Hold  off on adding this back until infection issues are improved.  2. Acute respiratory failure requiring mechanical ventilation: has been extubated.  Continues to do well off the ventilator.  3.  Hypotension.  Norepinephrine now stopped.  Continue to monitor blood pressure. LOS: [place], [place] 3/7/20198:11 AM

## 2018-02-25 NOTE — Consult Note (Signed)
Psychiatry: Spoke with nursing.  Patient remains sedated because he is agitated and delirious when he wakes up.  We will continue to follow up as possible

## 2018-02-25 NOTE — Progress Notes (Signed)
[facility] [place] NAME: Jonathon Snow    MR#:  [id number]  DATE OF BIRTH:  Jan 24, 1961  SUBJECTIVE:  CHIEF COMPLAINT:   Chief Complaint  Patient presents with  . Unresponsive  Successfully extubated, on BiPAP  REVIEW OF SYSTEMS:  CONSTITUTIONAL: No fever, fatigue or weakness.  EYES: No blurred or double vision.  EARS, NOSE, AND THROAT: No tinnitus or ear pain.  RESPIRATORY: No cough, shortness of breath, wheezing or hemoptysis.  CARDIOVASCULAR: No chest pain, orthopnea, edema.  GASTROINTESTINAL: No nausea, vomiting, diarrhea or abdominal pain.  GENITOURINARY: No dysuria, hematuria.  ENDOCRINE: No polyuria, nocturia,  HEMATOLOGY: No anemia, easy bruising or bleeding SKIN: No rash or lesion. MUSCULOSKELETAL: No joint pain or arthritis.   NEUROLOGIC: No tingling, numbness, weakness.  PSYCHIATRY: No anxiety or depression.   ROS  DRUG ALLERGIES:   Allergies  Allergen Reactions  . Hydrocodone Rash  . Vicodin [Hydrocodone-Acetaminophen] Hives and Rash    Severe headaches (also)    VITALS:  Blood pressure (!) 178/97, pulse (!) 43, temperature (!) 97.4 F (36.3 C), temperature source Axillary, resp. rate (!) 9, weight 111.8 kg (246 lb 7.6 oz), SpO2 98 %.  PHYSICAL EXAMINATION:  GENERAL:  57 y.o.-year-old patient lying in the bed with no acute distress.  EYES: Pupils equal, round, reactive to light and accommodation. No scleral icterus. Extraocular muscles intact.  HEENT: Head atraumatic, normocephalic. Oropharynx and nasopharynx clear.  NECK:  Supple, no jugular venous distention. No thyroid enlargement, no tenderness.  LUNGS: Normal breath sounds bilaterally, no wheezing, rales,rhonchi or crepitation. No use of accessory muscles of respiration.  CARDIOVASCULAR: S1, S2 normal. No murmurs, rubs, or gallops.  ABDOMEN: Soft, nontender, nondistended. Bowel sounds present. No organomegaly or mass.  EXTREMITIES: No pedal edema, cyanosis,  or clubbing.  NEUROLOGIC: Cranial nerves II through XII are intact. Muscle strength 5/5 in all extremities. Sensation intact. Gait not checked.  PSYCHIATRIC: The patient is alert and oriented x 3.  SKIN: No obvious rash, lesion, or ulcer.   Physical Exam LABORATORY PANEL:   CBC Recent Labs  Lab 02/25/18 0351  WBC 12.9*  HGB 12.4*  HCT 38.1*  PLT 107*   ------------------------------------------------------------------------------------------------------------------  Chemistries  Recent Labs  Lab 02/21/18 0543  02/23/18 0144  02/25/18 0351  NA 140   < > 139   < > 144  K 4.2   < > 3.9   < > 3.7  CL 110   < > 99*   < > 95*  CO2 20*   < > 28   < > 37*  GLUCOSE 147*   < > 151*   < > 157*  BUN 31*   < > 43*   < > 85*  CREATININE 2.14*   < > 2.39*   < > 2.00*  CALCIUM 7.8*   < > 8.0*   < > 8.3*  MG  --    < > 2.1  --   --   AST 29  --   --   --   --   ALT 21  --   --   --   --   ALKPHOS 81  --   --   --   --   BILITOT 0.4  --   --   --   --    < > = values in this interval not displayed.   ------------------------------------------------------------------------------------------------------------------  Cardiac Enzymes Recent Labs  Lab 02/21/18 1209 02/21/18 1720  TROPONINI 0.06* 0.07*   ------------------------------------------------------------------------------------------------------------------  RADIOLOGY:  No results found.  ASSESSMENT AND PLAN:  57 year old male patient with history of DVT, pulmonary embolism, CKD stage III, lupus erythematosus presented to the emergency room with decreased responsiveness.  Admitting diagnosis 1.Acute hypoxic respiratory failure  Stable  Secondary to pneumonia Status post extubation on February 24, 2018, continue BiPAP with weaning as tolerated    2.Acute encephalopathy/altered mental status Most likely secondary to pneumonia with associated acute hypoxic respiratory failure Improved  3.Acute aspiration  pneumonia Resolving Continue empiricantibiotics on ourpneumonia protocol  4.Acute septic shock  Resolving Secondary toacute pneumonia Continue pressors with weaning as tolerated, antibiotics per above, sepsis protocol  5.ARF/AKI w/ CKD III Improved  Secondary to ATN from acute illness  Treated with CRRT-discontinued on March 25, 2018 Nephrology input greatly appreciated  6.Lupus erythematosus Stable  7.History of DVT and pulmonary embolism Restarted on Eliquis twice daily  All the records are reviewed and case discussed with Care Management/Social Workerr. Management plans discussed with the patient, family and they are in agreement.  CODE STATUS: full  TOTAL TIME TAKING CARE OF THIS PATIENT: 35 minutes.     POSSIBLE D/C IN 2-5 DAYS, DEPENDING ON CLINICAL CONDITION.   Avel Peace Salary M.D on 02/25/2018   Between 7am to 6pm - Pager - [phone]  After 6pm go to [email or web address] - password [email or web address] [facility]  Office  [phone]  CC: Primary care physician; Arnetha Courser, MD  Note: This dictation was prepared with Dragon dictation along with smaller phrase technology. Any transcriptional errors that result from this process are unintentional.

## 2018-02-26 ENCOUNTER — Inpatient Hospital Stay: Payer: 59

## 2018-02-26 ENCOUNTER — Other Ambulatory Visit: Payer: Self-pay

## 2018-02-26 DIAGNOSIS — F1110 Opioid abuse, uncomplicated: Secondary | ICD-10-CM

## 2018-02-26 DIAGNOSIS — R410 Disorientation, unspecified: Secondary | ICD-10-CM

## 2018-02-26 HISTORY — DX: Opioid abuse, uncomplicated: F11.10

## 2018-02-26 LAB — BASIC METABOLIC PANEL
Anion gap: 13 (ref 5–15)
BUN: 78 mg/dL — AB (ref 6–20)
CO2: 33 mmol/L — ABNORMAL HIGH (ref 22–32)
CREATININE: 1.74 mg/dL — AB (ref 0.61–1.24)
Calcium: 8.4 mg/dL — ABNORMAL LOW (ref 8.9–10.3)
Chloride: 101 mmol/L (ref 101–111)
GFR calc Af Amer: 49 mL/min — ABNORMAL LOW (ref >60)
GFR, EST NON AFRICAN AMERICAN: 42 mL/min — AB (ref >60)
Glucose, Bld: 137 mg/dL — ABNORMAL HIGH (ref 65–99)
Potassium: 3.7 mmol/L (ref 3.5–5.1)
SODIUM: 147 mmol/L — AB (ref 135–145)

## 2018-02-26 LAB — CBC
HCT: 39.1 % — ABNORMAL LOW (ref 40.0–52.0)
Hemoglobin: 12.4 g/dL — ABNORMAL LOW (ref 13.0–18.0)
MCH: 28.4 pg (ref 26.0–34.0)
MCHC: 31.7 g/dL — ABNORMAL LOW (ref 32.0–36.0)
MCV: 89.8 fL (ref 80.0–100.0)
PLATELETS: 105 10*3/uL — AB (ref 150–440)
RBC: 4.35 MIL/uL — ABNORMAL LOW (ref 4.40–5.90)
RDW: 14.6 % — AB (ref 11.5–14.5)
WBC: 13.5 10*3/uL — AB (ref 3.8–10.6)

## 2018-02-26 LAB — GLUCOSE, CAPILLARY
GLUCOSE-CAPILLARY: 91 mg/dL (ref 65–99)
GLUCOSE-CAPILLARY: 106 mg/dL — AB (ref 65–99)
GLUCOSE-CAPILLARY: 80 mg/dL (ref 65–99)
Glucose-Capillary: 94 mg/dL (ref 65–99)
Glucose-Capillary: 75 mg/dL (ref 65–99)

## 2018-02-26 MED ORDER — PANTOPRAZOLE SODIUM 40 MG PO TBEC
40.0000 mg | DELAYED_RELEASE_TABLET | Freq: Every day | ORAL | Status: DC
  Administered 2018-02-27 – 2018-03-03 (×5): 40 mg via ORAL
  Filled 2018-02-26 (×5): qty 1

## 2018-02-26 MED ORDER — INSULIN ASPART 100 UNIT/ML ~~LOC~~ SOLN: 0.0000 [IU] | Freq: Every day | SUBCUTANEOUS | Status: DC

## 2018-02-26 MED ORDER — INSULIN ASPART 100 UNIT/ML ~~LOC~~ SOLN
0.0000 [IU] | Freq: Three times a day (TID) | SUBCUTANEOUS | Status: DC
  Administered 2018-02-28 – 2018-03-03 (×6): 2 [IU] via SUBCUTANEOUS
  Filled 2018-02-26 (×7): qty 1

## 2018-02-26 MED ORDER — SODIUM CHLORIDE 0.9 % IV SOLN
3.0000 g | Freq: Four times a day (QID) | INTRAVENOUS | Status: DC
  Administered 2018-02-26 – 2018-03-01 (×13): 3 g via INTRAVENOUS
  Filled 2018-02-26 (×17): qty 3
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
## 2018-02-26 NOTE — Progress Notes (Signed)
Pharmacy Antibiotic Note  Jonathon Snow is a 57 y.o. male admitted on 02/21/2018 with aspiration pneumonia .  Pharmacy has been consulted for Unasyn dosing. Patient transitioned from [place] during am rounds.   Plan: Unasyn 3g IV Q6hr.   Height: 6' (182.9 cm) Weight: 246 lb 4.1 oz (111.7 kg) IBW/kg (Calculated) : 77.6  Temp (24hrs), Avg:98.1 F (36.7 C), Min:97.6 F (36.4 C), Max:98.5 F (36.9 C)  Recent Labs  Lab 02/21/18 0318  02/22/18 0249 02/22/18 0452 02/22/18 0817 02/22/18 1152 02/23/18 0144 02/24/18 0506 02/25/18 0351 02/26/18 0435  WBC  --    < >  --  15.5*  --   --  23.3* 19.1* 12.9* 13.5*  CREATININE  --    < >  --  1.73* 1.63* 1.56* 2.39* 2.03* 2.00*  --   LATICACIDVEN 0.4*  --  2.2*  --   --   --   --   --   --   --    < > = values in this interval not displayed.    Estimated Creatinine Clearance: 53.2 mL/min (A) (by C-G formula based on SCr of 2 mg/dL (H)).    Allergies  Allergen Reactions  . Hydrocodone Rash  . Vicodin [Hydrocodone-Acetaminophen] Hives and Rash    Severe headaches (also)    Antimicrobials this admission: Vancomycin 3/3 >> 3/3 Zosyn 3/3 >> 3/8 Unasyn 3/8 >>   Dose adjustments this admission: N/A  Microbiology results: 3/3 MRSA PCR: negative  Thank you for allowing pharmacy to be a part of this patient's care.  Shmuel Girgis L 02/26/2018 5:06 PM

## 2018-02-26 NOTE — Progress Notes (Signed)
[name]  ROUNDING NOTE   Subjective:   UOP 3625 cc S Creatinine 2.0 on 3/7 (about the same 2.03 previous day) More alert today   Objective:  Vital signs in last 24 hours:  Temp:  [98.1 F (36.7 C)-98.7 F (37.1 C)] 98.1 F (36.7 C) (03/07 2300) Pulse Rate:  [41-55] 48 (03/08 0600) Resp:  [10-19] 18 (03/08 0600) BP: (117-190)/(73-96) 122/73 (03/08 0600) SpO2:  [93 %-100 %] 93 % (03/08 0600) Weight:  [111.7 kg (246 lb 4.1 oz)-113.6 kg (250 lb 7.1 oz)] 111.7 kg (246 lb 4.1 oz) (03/08 0500)  Weight change: 1.8 kg (3 lb 15.5 oz) Filed Weights   02/25/18 0500 02/25/18 1230 02/26/18 0500  Weight: 111.8 kg (246 lb 7.6 oz) 113.6 kg (250 lb 7.1 oz) 111.7 kg (246 lb 4.1 oz)    Intake/Output: I/O last 3 completed shifts: In: 1705.4 [I.V.:1505.4; IV Piggyback:200] Out: 7253 [Urine:6775]   Intake/Output this shift:  No intake/output data recorded.  Physical Exam: General: No acute distress  Head: Hearing intact, OM moist  Eyes: Anicteric  Neck: right IJ triple lumen  Lungs:  Clear to auscultation, normal effort  Heart: Regular rate and rhythm  Abdomen:  Soft, nontender, BS present  Extremities: trace peripheral edema.  Neurologic: Alert and able to follow commands  Skin: No lesions  Access: Right femoral temp HD catheter Dr. Mortimer Fries 3/3    Basic Metabolic Panel: Recent Labs  Lab 02/21/18 2150 02/22/18 0048 02/22/18 0452 02/22/18 0817 02/22/18 1152 02/23/18 0144 02/24/18 0506 02/25/18 0351  NA 140 140 138 139 139 139 141 144  K 5.4* 4.7 4.3 4.1 4.0 3.9 3.3* 3.7  CL 107 107 104 103 100* 99* 96* 95*  CO2 23 25 25 27 29 28  33* 37*  GLUCOSE 208* 213* 189* 215* 174* 151* 142* 157*  BUN 34* 32* 31* 27* 25* 43* 69* 85*  CREATININE 2.20* 1.78* 1.73* 1.63* 1.56* 2.39* 2.03* 2.00*  CALCIUM 7.5* 7.4* 7.4* 7.5* 7.9* 8.0* 8.0* 8.3*  MG  --  1.5* 2.8* 2.2 2.1 2.1  --   --   PHOS 3.8 3.4 3.5 3.1 2.6  --   --   --     Liver Function Tests: Recent Labs  Lab  02/21/18 0252 02/21/18 0543  02/21/18 2150 02/22/18 0048 02/22/18 0452 02/22/18 0817 02/22/18 1152  AST 30 29  --   --   --   --   --   --   ALT 18 21  --   --   --   --   --   --   ALKPHOS 79 81  --   --   --   --   --   --   BILITOT 0.6 0.4  --   --   --   --   --   --   PROT 6.4* 6.4*  --   --   --   --   --   --   ALBUMIN 3.4* 3.2*   < > 2.8* 2.8* 2.7* 2.7* 2.7*   < > = values in this interval not displayed.   Recent Labs  Lab 02/21/18 0252  LIPASE 56*   No results for input(s): AMMONIA in the last 168 hours.  CBC: Recent Labs  Lab 02/21/18 0252  02/22/18 0452 02/23/18 0144 02/24/18 0506 02/25/18 0351 02/26/18 0435  WBC 8.6   < > 15.5* 23.3* 19.1* 12.9* 13.5*  NEUTROABS 6.7*  --   --   --   --   --   --  HGB 13.5   < > 13.1 12.1* 11.5* 12.4* 12.4*  HCT 42.8   < > 40.7 38.9* 36.0* 38.1* 39.1*  MCV 92.2   < > 90.8 90.4 88.9 88.9 89.8  PLT 193   < > 104* 102* 108* 107* 105*   < > = values in this interval not displayed.    Cardiac Enzymes: Recent Labs  Lab 02/21/18 0252 02/21/18 0543 02/21/18 1209 02/21/18 1720  TROPONINI <0.03 0.03* 0.06* 0.07*    BNP: Invalid input(s): POCBNP  CBG: Recent Labs  Lab 02/25/18 1646 02/25/18 2000 02/25/18 2357 02/26/18 0333 02/26/18 0807  GLUCAP 130* 120* 122* 94 91    Microbiology: Results for orders placed or performed during the hospital encounter of 02/21/18  MRSA PCR Screening     Status: None   Collection Time: 02/21/18  4:23 AM  Result Value Ref Range Status   MRSA by PCR NEGATIVE NEGATIVE Final    Comment:        The GeneXpert MRSA Assay (FDA approved for NASAL specimens only), is one component of a comprehensive MRSA colonization surveillance program. It is not intended to diagnose MRSA infection nor to guide or monitor treatment for MRSA infections. Performed at [facility], [address] [place], [place] [address]     Coagulation Studies: No results for input(s): LABPROT,  INR in the last 72 hours.  Urinalysis: No results for input(s): COLORURINE, LABSPEC, PHURINE, GLUCOSEU, HGBUR, BILIRUBINUR, KETONESUR, PROTEINUR, UROBILINOGEN, NITRITE, LEUKOCYTESUR in the last 72 hours.  Invalid input(s): APPERANCEUR    Imaging: No results found.   Medications:   . dexmedetomidine (PRECEDEX) IV infusion 0.3 mcg/kg/hr (02/26/18 0856)  . piperacillin-tazobactam (ZOSYN)  IV Stopped (02/26/18 0843)  . pureflow 3 each (02/22/18 0925)   . apixaban  5 mg Oral BID  . docusate sodium  100 mg Oral BID  . furosemide  20 mg Intravenous Daily  . insulin aspart  0-15 Units Subcutaneous Q4H  . mouth rinse  15 mL Mouth Rinse 10 times per day  . methylPREDNISolone (SOLU-MEDROL) injection  40 mg Intravenous Daily  . pantoprazole (PROTONIX) IV  40 mg Intravenous Q24H  . QUEtiapine  100 mg Oral QHS  . QUEtiapine  50 mg Oral Once  . sertraline  50 mg Oral Daily  . sodium chloride flush  10-40 mL Intracatheter Q12H   bisacodyl, fentaNYL, hydrALAZINE, ipratropium-albuterol, LORazepam, [DISCONTINUED] ondansetron **OR** ondansetron (ZOFRAN) IV, sodium chloride flush  Assessment/ Plan:  Mr. OSAZE HUBBERT is a 57 y.o. 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 Intubated and sedated.   1. Acute Renal Failure on chronic kidney disease stage III: baseline creatinine of 1.75, GFR of 44 on 10/2017.  Chronic kidney disease stage III with hematuria and proteinuria secondary to lupus nephritis.  Currently off cellcept.   Acute renal failure from ATN from acute illness, prerenal azotemia and hypoxia - Obtain BMP today - d/c lasix -  Patient was on CellCept as an outpatient.  Hold off on adding this back until infection issues are improved.  2. Acute respiratory failure requiring mechanical ventilation: has been extubated.  Continues to do well off the ventilator.  3.  Hypotension.  Norepinephrine  now stopped.  Continue to monitor blood pressure..   LOS: 5 Rick Carruthers 3/8/20199:14 AM

## 2018-02-26 NOTE — Progress Notes (Addendum)
[facility] Progess Note    SYNOPSIS   57 year old male patient with history of DVT, pulmonary embolism, CKD stage III, lupus erythematosus presented to the emergency room with decreased responsiveness  ASSESSMENT/PLAN   Status post respiratory failure requiring intubation. Presently doing well with regards to pulmonary status. Blunted breath sounds on the right lung base, will obtain follow-up chest x-ray and closely monitor oxygen saturation. Patient is empirically on Zosyn and Solu-Medrol  Renal failure. Being followed by nephrology, Was on CRRT,most likely resolving ATN with decreasing creatinine to 2, more prerenal indices this morning. Status post 2.6 L diuresis  Encephalopathy. Has been followed by psychiatry, felt to be delirium/metabolic encephalopathy. Patient able to answer questions this morning    INTAKE / OUTPUT:  Intake/Output Summary (Last 24 hours) at 02/26/2018 0931 Last data filed at 02/26/2018 5573 Gross per 24 hour  Intake 659.5 ml  Output 3275 ml  Net -2615.5 ml   Name: Jonathon Snow MRN: [id number] DOB: [birth year]    ADMISSION DATE:  02/21/2018  SUBJECTIVE:   Patient is responsive but extremely lethargic and sedated. He is arousable to question  VITAL SIGNS: Temp:  [98.1 F (36.7 C)-98.7 F (37.1 C)] 98.1 F (36.7 C) (03/07 2300) Pulse Rate:  [41-55] 48 (03/08 0600) Resp:  [10-19] 18 (03/08 0600) BP: (117-190)/(73-96) 122/73 (03/08 0600) SpO2:  [93 %-100 %] 93 % (03/08 0600) Weight:  [111.7 kg (246 lb 4.1 oz)-113.6 kg (250 lb 7.1 oz)] 111.7 kg (246 lb 4.1 oz) (03/08 0500)   PHYSICAL EXAMINATION: Physical Examination:   VS: BP 122/73   Pulse (!) 48   Temp 98.1 F (36.7 C) (Axillary)   Resp 18   Wt 111.7 kg (246 lb 4.1 oz)   SpO2 93%   BMI 31.62 kg/m   General Appearance: No distress, atient is lethargic but responding  Neuro:Limited exam, no clear focal deficits noted HEENT: no respiratory distress noted, no  accessory muscle utilization, no stridor, trachea midline Pulmonary: diminished breath sounds right lung base CardiovascularNormal S1,S2.  No m/r/g.   Abdomen: Benign, Soft, non-tender. Skin:   warm, no rashes, no ecchymosis  Extremities: normal, no cyanosis, clubbing.    LABORATORY PANEL:   CBC Recent Labs  Lab 02/26/18 0435  WBC 13.5*  HGB 12.4*  HCT 39.1*  PLT 105*    Chemistries  Recent Labs  Lab 02/21/18 0543  02/22/18 1152 02/23/18 0144  02/25/18 0351  NA 140   < > 139 139   < > 144  K 4.2   < > 4.0 3.9   < > 3.7  CL 110   < > 100* 99*   < > 95*  CO2 20*   < > 29 28   < > 37*  GLUCOSE 147*   < > 174* 151*   < > 157*  BUN 31*   < > 25* 43*   < > 85*  CREATININE 2.14*   < > 1.56* 2.39*   < > 2.00*  CALCIUM 7.8*   < > 7.9* 8.0*   < > 8.3*  MG  --    < > 2.1 2.1  --   --   PHOS  --    < > 2.6  --   --   --   AST 29  --   --   --   --   --   ALT 21  --   --   --   --   --  ALKPHOS 81  --   --   --   --   --   BILITOT 0.4  --   --   --   --   --    < > = values in this interval not displayed.    Recent Labs  Lab 02/25/18 1138 02/25/18 1646 02/25/18 2000 02/25/18 2357 02/26/18 0333 02/26/18 0807  GLUCAP 133* 130* 120* 122* 94 91   Recent Labs  Lab 02/21/18 1920 02/21/18 2330 02/22/18 0500  PHART 7.19* 7.29* 7.32*  PCO2ART 48 51* 50*  PO2ART 74* 90 119*   Recent Labs  Lab 02/21/18 0252 02/21/18 0543  02/22/18 0452 02/22/18 0817 02/22/18 1152  AST 30 29  --   --   --   --   ALT 18 21  --   --   --   --   ALKPHOS 79 81  --   --   --   --   BILITOT 0.6 0.4  --   --   --   --   ALBUMIN 3.4* 3.2*   < > 2.7* 2.7* 2.7*   < > = values in this interval not displayed.    Cardiac Enzymes Recent Labs  Lab 02/21/18 1720  TROPONINI 0.07*    RADIOLOGY:  No results found.   Hermelinda Dellen, DO  02/26/2018

## 2018-02-26 NOTE — Progress Notes (Signed)
[facility] at [place] NAME: Jonathon Snow    MR#:  [id number]  DATE OF BIRTH:  [birth year]  SUBJECTIVE:  CHIEF COMPLAINT:   Chief Complaint  Patient presents with  . Unresponsive  Case discussed with nephrology-Lasix discontinued, continue to hold off on CellCept until infection is cleared  REVIEW OF SYSTEMS:  CONSTITUTIONAL: No fever, fatigue or weakness.  EYES: No blurred or double vision.  EARS, NOSE, AND THROAT: No tinnitus or ear pain.  RESPIRATORY: No cough, shortness of breath, wheezing or hemoptysis.  CARDIOVASCULAR: No chest pain, orthopnea, edema.  GASTROINTESTINAL: No nausea, vomiting, diarrhea or abdominal pain.  GENITOURINARY: No dysuria, hematuria.  ENDOCRINE: No polyuria, nocturia,  HEMATOLOGY: No anemia, easy bruising or bleeding SKIN: No rash or lesion. MUSCULOSKELETAL: No joint pain or arthritis.   NEUROLOGIC: No tingling, numbness, weakness.  PSYCHIATRY: No anxiety or depression.   ROS  DRUG ALLERGIES:   Allergies  Allergen Reactions  . Hydrocodone Rash  . Vicodin [Hydrocodone-Acetaminophen] Hives and Rash    Severe headaches (also)    VITALS:  Blood pressure 106/61, pulse (!) 52, temperature 97.6 F (36.4 C), temperature source Oral, resp. rate 19, weight 111.7 kg (246 lb 4.1 oz), SpO2 91 %.  PHYSICAL EXAMINATION:  GENERAL:  57 y.o.-year-old patient lying in the bed with no acute distress.  EYES: Pupils equal, round, reactive to light and accommodation. No scleral icterus. Extraocular muscles intact.  HEENT: Head atraumatic, normocephalic. Oropharynx and nasopharynx clear.  NECK:  Supple, no jugular venous distention. No thyroid enlargement, no tenderness.  LUNGS: Normal breath sounds bilaterally, no wheezing, rales,rhonchi or crepitation. No use of accessory muscles of respiration.  CARDIOVASCULAR: S1, S2 normal. No murmurs, rubs, or gallops.  ABDOMEN: Soft, nontender, nondistended. Bowel sounds present. No  organomegaly or mass.  EXTREMITIES: No pedal edema, cyanosis, or clubbing.  NEUROLOGIC: Cranial nerves II through XII are intact. Muscle strength 5/5 in all extremities. Sensation intact. Gait not checked.  PSYCHIATRIC: The patient is alert and oriented x 3.  SKIN: No obvious rash, lesion, or ulcer.   Physical Exam LABORATORY PANEL:   CBC Recent Labs  Lab 02/26/18 0435  WBC 13.5*  HGB 12.4*  HCT 39.1*  PLT 105*   ------------------------------------------------------------------------------------------------------------------  Chemistries  Recent Labs  Lab 02/21/18 0543  02/23/18 0144  02/25/18 0351  NA 140   < > 139   < > 144  K 4.2   < > 3.9   < > 3.7  CL 110   < > 99*   < > 95*  CO2 20*   < > 28   < > 37*  GLUCOSE 147*   < > 151*   < > 157*  BUN 31*   < > 43*   < > 85*  CREATININE 2.14*   < > 2.39*   < > 2.00*  CALCIUM 7.8*   < > 8.0*   < > 8.3*  MG  --    < > 2.1  --   --   AST 29  --   --   --   --   ALT 21  --   --   --   --   ALKPHOS 81  --   --   --   --   BILITOT 0.4  --   --   --   --    < > = values in this interval not displayed.   ------------------------------------------------------------------------------------------------------------------  Cardiac Enzymes Recent  Labs  Lab 02/21/18 1209 02/21/18 1720  TROPONINI 0.06* 0.07*   ------------------------------------------------------------------------------------------------------------------  RADIOLOGY:  Dg Chest Port 1 View  Result Date: 02/26/2018 CLINICAL DATA:  Shortness of breath. EXAM: PORTABLE CHEST 1 VIEW COMPARISON:  Single-view of the chest 02/21/2018 and PA and lateral chest 10/01/2015. FINDINGS: Endotracheal tube and NG tube present on the most recent examination has been removed. Right subclavian central venous catheter remains in place. Aeration in the right mid and lower lung zones is markedly improved since the most recent exam. There is mild left basilar atelectasis. Heart size is  normal. No pneumothorax. IMPRESSION: New small left pleural effusion.  No other new abnormality. Right effusion and airspace disease have markedly improved. Electronically Signed   By: Inge Rise M.D.   On: 02/26/2018 11:29    ASSESSMENT AND PLAN:  57 year old male patient with history of DVT, pulmonary embolism, CKD stage III, lupus erythematosus presented to the emergency room with decreased responsiveness.  Admitting diagnosis 1.Acute hypoxic respiratory failure  Stable  Secondary to pneumonia Status post extubation on February 24, 2018, continue to wean O2 as tolerated   2.Acute encephalopathy/altered mental status Resolved Most likely secondary to pneumonia with associated acute hypoxic respiratory failure  3.Acute aspiration pneumonia Resolving Continue empiricantibiotics on ourpneumonia protocol  4.Acute septic shock Resolved Secondary toacute pneumonia Successfully weaned off pressors, antibiotics per above, continue sepsis protocol  5.ARF/AKI w/ CKD III Improved  Secondary to ATN from acute illness  Treated with CRRT-discontinued on March 25, 2018 Nephrology input greatly appreciated-Lasix discontinued  6.Lupus erythematosus Stable CellCept on hold until infection has cleared  7.History of DVT and pulmonary embolism Continue Eliquis twice daily   all the records are reviewed and case discussed with Care Management/Social Workerr. Management plans discussed with the patient, family and they are in agreement.  CODE STATUS: full  TOTAL TIME TAKING CARE OF THIS PATIENT: 35 minutes.     POSSIBLE D/C IN 2-5 DAYS, DEPENDING ON CLINICAL CONDITION.   Avel Peace Salary M.D on 02/26/2018   Between 7am to 6pm - Pager - [phone]  After 6pm go to [email or web address] - password [email or web address] [facility]  Office  [phone]  CC: Primary care physician; Arnetha Courser, MD  Note: This dictation was prepared with Dragon  dictation along with smaller phrase technology. Any transcriptional errors that result from this process are unintentional.

## 2018-02-26 NOTE — Progress Notes (Signed)
PHARMACIST - PHYSICIAN COMMUNICATION  CONCERNING: IV to Oral Route Change Policy  RECOMMENDATION: This patient is receiving pantoprazole by the intravenous route.  Based on criteria approved by the Pharmacy and Therapeutics Committee, the intravenous medication(s) is/are being converted to the equivalent oral dose form(s).   DESCRIPTION: These criteria include:  The patient is eating (either orally or via tube) and/or has been taking other orally administered medications for a least 24 hours  The patient has no evidence of active gastrointestinal bleeding or impaired GI absorption (gastrectomy, short bowel, patient on TNA or NPO).  If you have questions about this conversion, please contact the Pharmacy Department  []   [phone] )  [facility] [x]   [phone] )  [facility] []   [phone] )  [facility] []   [phone] )  [facility] []   [phone] )  [place], [facility] 02/26/2018 5:09 PM

## 2018-02-26 NOTE — Progress Notes (Signed)
Report called to Abigail on 1C.  Pt will transport on 1C bed with portable O2 tank.  Meds chart and belongings will go with him.  VSS on 2 liters, patient agreeable to transfer

## 2018-02-26 NOTE — Progress Notes (Signed)
   02/26/18 1630  Clinical Encounter Type  Visited With Patient and family together  Visit Type Initial  Referral From Nurse  Consult/Referral To Chaplain  Stress Factors  Patient Stress Factors Other (Comment) (Pt shared there are "bad people" at his house)   Chaplain responded to page for help with advanced directive.  Pt declined the need for an advanced directive and requested a notary for a different document.  Chaplain is unable to fulfil this request.    Rev. [place]as

## 2018-02-27 LAB — GLUCOSE, CAPILLARY
GLUCOSE-CAPILLARY: 97 mg/dL (ref 65–99)
GLUCOSE-CAPILLARY: 77 mg/dL (ref 65–99)
GLUCOSE-CAPILLARY: 91 mg/dL (ref 65–99)
GLUCOSE-CAPILLARY: 114 mg/dL — AB (ref 65–99)
GLUCOSE-CAPILLARY: 154 mg/dL — AB (ref 65–99)
Glucose-Capillary: 58 mg/dL — ABNORMAL LOW (ref 65–99)

## 2018-02-27 MED ORDER — GUAIFENESIN 100 MG/5ML PO SOLN
5.0000 mL | ORAL | Status: DC | PRN
  Administered 2018-02-27 – 2018-03-02 (×6): 100 mg via ORAL
  Filled 2018-02-27 (×9): qty 5

## 2018-02-27 NOTE — Progress Notes (Signed)
[place] at [place] NAME: Jonathon Snow    MR#:  [id number]  DATE OF BIRTH:  15-Feb-1961  SUBJECTIVE:  CHIEF COMPLAINT:   Chief Complaint  Patient presents with  . Unresponsive  Patient is doing okay reporting weakness continue to hold off on CellCept until infection is treated with course of antibiotics  REVIEW OF SYSTEMS:  CONSTITUTIONAL: No fever, fatigue or weakness.  EYES: No blurred or double vision.  EARS, NOSE, AND THROAT: No tinnitus or ear pain.  RESPIRATORY: No cough, shortness of breath, wheezing or hemoptysis.  CARDIOVASCULAR: No chest pain, orthopnea, edema.  GASTROINTESTINAL: No nausea, vomiting, diarrhea or abdominal pain.  GENITOURINARY: No dysuria, hematuria.  ENDOCRINE: No polyuria, nocturia,  HEMATOLOGY: No anemia, easy bruising or bleeding SKIN: No rash or lesion. MUSCULOSKELETAL: No joint pain or arthritis.   NEUROLOGIC: No tingling, numbness, weakness.  PSYCHIATRY: No anxiety or depression.   ROS  DRUG ALLERGIES:   Allergies  Allergen Reactions  . Hydrocodone Rash  . Vicodin [Hydrocodone-Acetaminophen] Hives and Rash    Severe headaches (also)    VITALS:  Blood pressure 134/65, pulse 83, temperature 98.1 F (36.7 C), temperature source Oral, resp. rate 20, height 6' (1.829 m), weight 105.4 kg (232 lb 6.4 oz), SpO2 92 %.  PHYSICAL EXAMINATION:  GENERAL:  57 y.o.-year-old patient lying in the bed with no acute distress.  EYES: Pupils equal, round, reactive to light and accommodation. No scleral icterus. Extraocular muscles intact.  HEENT: Head atraumatic, normocephalic. Oropharynx and nasopharynx clear.  NECK:  Supple, no jugular venous distention. No thyroid enlargement, no tenderness.  LUNGS: Normal breath sounds bilaterally, no wheezing, rales,rhonchi or crepitation. No use of accessory muscles of respiration.  CARDIOVASCULAR: S1, S2 normal. No murmurs, rubs, or gallops.  ABDOMEN: Soft, nontender,  nondistended. Bowel sounds present. No organomegaly or mass.  EXTREMITIES: No pedal edema, cyanosis, or clubbing.  NEUROLOGIC: Cranial nerves II through XII are intact. Muscle strength 5/5 in all extremities. Sensation intact. Gait not checked.  PSYCHIATRIC: The patient is alert and oriented x 3.  SKIN: No obvious rash, lesion, or ulcer.   Physical Exam LABORATORY PANEL:   CBC Recent Labs  Lab 02/26/18 0435  WBC 13.5*  HGB 12.4*  HCT 39.1*  PLT 105*   ------------------------------------------------------------------------------------------------------------------  Chemistries  Recent Labs  Lab 02/21/18 0543  02/23/18 0144  02/26/18 0930  NA 140   < > 139   < > 147*  K 4.2   < > 3.9   < > 3.7  CL 110   < > 99*   < > 101  CO2 20*   < > 28   < > 33*  GLUCOSE 147*   < > 151*   < > 137*  BUN 31*   < > 43*   < > 78*  CREATININE 2.14*   < > 2.39*   < > 1.74*  CALCIUM 7.8*   < > 8.0*   < > 8.4*  MG  --    < > 2.1  --   --   AST 29  --   --   --   --   ALT 21  --   --   --   --   ALKPHOS 81  --   --   --   --   BILITOT 0.4  --   --   --   --    < > = values in this interval not  displayed.   ------------------------------------------------------------------------------------------------------------------  Cardiac Enzymes Recent Labs  Lab 02/21/18 1209 02/21/18 1720  TROPONINI 0.06* 0.07*   ------------------------------------------------------------------------------------------------------------------  RADIOLOGY:  Dg Chest Port 1 View  Result Date: 02/26/2018 CLINICAL DATA:  Shortness of breath. EXAM: PORTABLE CHEST 1 VIEW COMPARISON:  Single-view of the chest 02/21/2018 and PA and lateral chest 10/01/2015. FINDINGS: Endotracheal tube and NG tube present on the most recent examination has been removed. Right subclavian central venous catheter remains in place. Aeration in the right mid and lower lung zones is markedly improved since the most recent exam. There is mild  left basilar atelectasis. Heart size is normal. No pneumothorax. IMPRESSION: New small left pleural effusion.  No other new abnormality. Right effusion and airspace disease have markedly improved. Electronically Signed   By: Inge Rise M.D.   On: 02/26/2018 11:29    ASSESSMENT AND PLAN:  57 year old male patient with history of DVT, pulmonary embolism, CKD stage III, lupus erythematosus presented to the emergency room with decreased responsiveness.   1.Acute hypoxic respiratory failure  Stable  Secondary to pneumonia Status post extubation on February 24, 2018, continue to wean O2 as tolerated   2.Acute encephalopathy/altered mental status Resolved Most likely secondary to pneumonia with associated acute hypoxic respiratory failure  3.Acute aspiration pneumonia Resolving Continue empiricantibiotics on ourpneumonia protocol  4.Acute septic shock Resolved Secondary toacute pneumonia Successfully weaned off pressors, antibiotics per above, continue sepsis protocol  5.ARF/AKI w/ CKD III Improved , creatinine at patient's baseline 1.74 Secondary to ATN from acute illness  Treated with CRRT-discontinued on March 25, 2018 Nephrology input greatly appreciated-Lasix discontinued  6.Lupus erythematosus Stable CellCept on hold until antibiotic course is completed for infection  7.History of DVT and pulmonary embolism Continue Eliquis twice daily  Generalized weakness PT is recommending skilled nursing facility all the records are reviewed and case discussed with Care Management/Social Workerr. Management plans discussed with the patient, family and they are in agreement.  CODE STATUS: full  TOTAL TIME TAKING CARE OF THIS PATIENT: 27 minutes.     POSSIBLE D/C IN 2-5 DAYS, DEPENDING ON CLINICAL CONDITION.   [name] M.D on 02/27/2018   Between 7am to 6pm - Pager - [phone]  After 6pm go to [email or web address] - password [email or web address] [facility]  Office  [phone]  CC: Primary care physician; Arnetha Courser, MD  Note: This dictation was prepared with Dragon dictation along with smaller phrase technology. Any transcriptional errors that result from this process are unintentional.
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
## 2018-02-27 NOTE — Evaluation (Signed)
Physical Therapy Evaluation Patient Details Name: Jonathon Snow MRN: [id number] DOB: 06-07-1961 Today's Date: 02/27/2018   History of Present Illness  57 y.o. male with a known history of deep venous thrombosis on Eliquis for anticoagulation, lupus erythematosus, pulmonary embolism, chronic kidney disease stage III, hyperlipidemia, hypertension was brought to the emergency room unresponsive.  The patient was picked up by EMS and they found to be patient in a agonal breathing.  Ultimately needed to be intubated, pt has been aggressive at times during hospitalization.  Clinical Impression  Pt lethargic but willing to answer questions and do some light strength testing, exercises in bed.  Pt ultimately unwilling to even attempt sitting at EOB, much less standing/walking because of how weak he feels.  Per CNA they needed 3 people to get him back into bed off commode earlier today because of profound weakness, and with testing he did have severe weakness in quads and glutes per his normal independent and active baseline.  Pt understands that he is too weak to go home at this point, but hopes to be able to work with PT during hospitalization and go home rather than STR.  At this time rehab is is only safe option.     Follow Up Recommendations SNF    Equipment Recommendations  (TBD)    Recommendations for Other Services       Precautions / Restrictions Precautions Precautions: Fall Restrictions Weight Bearing Restrictions: No      Mobility  Bed Mobility               General bed mobility comments: Pt reports being to tired to even try to sit up despite a lot of encourgement from PT.  Transfers                    Ambulation/Gait                Stairs            Wheelchair Mobility    Modified Rankin (Stroke Patients Only)       Balance                                             Pertinent Vitals/Pain Pain Assessment: (reports  feeling very weak, not a lot of pain)    Home Living Family/patient expects to be discharged to:: Skilled nursing facility(wanting to go home if possible) Living Arrangements: Spouse/significant other(apparently there is some substance abuse in the home)                    Prior Function Level of Independence: Independent         Comments: Patient is a Scientist, 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      Hand Dominance        Extremity/Trunk Assessment   Upper Extremity Assessment Upper Extremity Assessment: Generalized weakness(pt grossly 3-/5 in b/l UEs, not near baseline)    Lower Extremity Assessment Lower Extremity Assessment: Generalized weakness(grossly 2+ with knee/hip extension, otherwise 3/5 t/o)       Communication   Communication: No difficulties  Cognition Arousal/Alertness: Lethargic Behavior During Therapy: WFL for tasks assessed/performed Overall Cognitive Status: (kept eyes closed most of the time, interactive but v. tired)  General Comments      Exercises     Assessment/Plan    PT Assessment Patient needs continued PT services  PT Problem List Decreased strength;Decreased range of motion;Decreased activity tolerance;Decreased balance;Decreased mobility;Decreased coordination;Decreased cognition;Decreased knowledge of use of DME;Decreased safety awareness       PT Treatment Interventions DME instruction;Gait training;Stair training;Functional mobility training;Therapeutic activities;Therapeutic exercise;Balance training;Neuromuscular re-education;Cognitive remediation;Patient/family education    PT Goals (Current goals can be found in the Care Plan section)  Acute Rehab PT Goals Patient Stated Goal: pt wants to go home if possible PT Goal Formulation: With patient Time For Goal Achievement: 03/13/18 Potential to Achieve Goals: Fair    Frequency Min 2X/week   Barriers to  discharge        Co-evaluation               AM-PAC PT "6 Clicks" Daily Activity  Outcome Measure Difficulty turning over in bed (including adjusting bedclothes, sheets and blankets)?: Unable Difficulty moving from lying on back to sitting on the side of the bed? : Unable Difficulty sitting down on and standing up from a chair with arms (e.g., wheelchair, bedside commode, etc,.)?: Unable Help needed moving to and from a bed to chair (including a wheelchair)?: Total Help needed walking in hospital room?: Total Help needed climbing 3-5 steps with a railing? : Total 6 Click Score: 6    End of Session   Activity Tolerance: Patient limited by fatigue Patient left: with bed alarm set;with call bell/phone within reach Nurse Communication: Mobility status PT Visit Diagnosis: Muscle weakness (generalized) (M62.81);Difficulty in walking, not elsewhere classified (R26.2)    Time: 1029-1050 PT Time Calculation (min) (ACUTE ONLY): 21 min   Charges:   PT Evaluation $PT Eval Low Complexity: 1 Low     PT G CodesKreg Shropshire, DPT 02/27/2018, 1:35 PM

## 2018-02-27 NOTE — Progress Notes (Signed)
[name]  ROUNDING NOTE   Subjective:   UOP 4250 cc Serum creatinine now back to baseline at 1.74 Potassium is normal at 3.7 Patient reports feeling nauseous and had an episode of vomiting He is able to tolerate clears   Objective:  Vital signs in last 24 hours:  Temp:  [97.9 F (36.6 C)-98.5 F (36.9 C)] 98.1 F (36.7 C) (03/09 0511) Pulse Rate:  [57-96] 73 (03/09 0511) Resp:  [15-22] 18 (03/09 0511) BP: (116-146)/(61-90) 139/61 (03/09 0511) SpO2:  [90 %-99 %] 90 % (03/09 0511) Weight:  [105.4 kg (232 lb 6.4 oz)-111.7 kg (246 lb 4.1 oz)] 105.4 kg (232 lb 6.4 oz) (03/09 0511)  Weight change: -1.9 kg (-3 oz) Filed Weights   02/26/18 0500 02/26/18 1300 02/27/18 0511  Weight: 111.7 kg (246 lb 4.1 oz) 111.7 kg (246 lb 4.1 oz) 105.4 kg (232 lb 6.4 oz)    Intake/Output: I/O last 3 completed shifts: In: 534.6 [P.O.:238; I.V.:196.6; IV Piggyback:100] Out: 5650 [Urine:5650]   Intake/Output this shift:  Total I/O In: 120 [P.O.:120] Out: 2 [Emesis/NG output:1; Stool:1]  Physical Exam: General: No acute distress  Head: Hearing intact, OM moist  Eyes: Anicteric  Neck:  Supple  Lungs:  Clear to auscultation, normal effort  Heart: Regular rate and rhythm  Abdomen:  Soft, nontender, BS present  Extremities: trace peripheral edema.  Neurologic: Alert and able to follow commands  Skin: No lesions  Access: Right femoral temp HD catheter Dr. Mortimer Fries 3/3    Basic Metabolic Panel: Recent Labs  Lab 02/21/18 2150 02/22/18 0048 02/22/18 2774 02/22/18 0817 02/22/18 1152 02/23/18 0144 02/24/18 0506 02/25/18 0351 02/26/18 0930  NA 140 140 138 139 139 139 141 144 147*  K 5.4* 4.7 4.3 4.1 4.0 3.9 3.3* 3.7 3.7  CL 107 107 104 103 100* 99* 96* 95* 101  CO2 23 25 25 27 29 28  33* 37* 33*  GLUCOSE 208* 213* 189* 215* 174* 151* 142* 157* 137*  BUN 34* 32* 31* 27* 25* 43* 69* 85* 78*  CREATININE 2.20* 1.78* 1.73* 1.63* 1.56* 2.39* 2.03* 2.00* 1.74*  CALCIUM 7.5* 7.4*  7.4* 7.5* 7.9* 8.0* 8.0* 8.3* 8.4*  MG  --  1.5* 2.8* 2.2 2.1 2.1  --   --   --   PHOS 3.8 3.4 3.5 3.1 2.6  --   --   --   --     Liver Function Tests: Recent Labs  Lab 02/21/18 0252 02/21/18 0543  02/21/18 2150 02/22/18 0048 02/22/18 0452 02/22/18 0817 02/22/18 1152  AST 30 29  --   --   --   --   --   --   ALT 18 21  --   --   --   --   --   --   ALKPHOS 79 81  --   --   --   --   --   --   BILITOT 0.6 0.4  --   --   --   --   --   --   PROT 6.4* 6.4*  --   --   --   --   --   --   ALBUMIN 3.4* 3.2*   < > 2.8* 2.8* 2.7* 2.7* 2.7*   < > = values in this interval not displayed.   Recent Labs  Lab 02/21/18 0252  LIPASE 56*   No results for input(s): AMMONIA in the last 168 hours.  CBC: Recent Labs  Lab 02/21/18  4008  02/22/18 0452 02/23/18 0144 02/24/18 0506 02/25/18 0351 02/26/18 0435  WBC 8.6   < > 15.5* 23.3* 19.1* 12.9* 13.5*  NEUTROABS 6.7*  --   --   --   --   --   --   HGB 13.5   < > 13.1 12.1* 11.5* 12.4* 12.4*  HCT 42.8   < > 40.7 38.9* 36.0* 38.1* 39.1*  MCV 92.2   < > 90.8 90.4 88.9 88.9 89.8  PLT 193   < > 104* 102* 108* 107* 105*   < > = values in this interval not displayed.    Cardiac Enzymes: Recent Labs  Lab 02/21/18 0252 02/21/18 0543 02/21/18 1209 02/21/18 1720  TROPONINI <0.03 0.03* 0.06* 0.07*    BNP: Invalid input(s): POCBNP  CBG: Recent Labs  Lab 02/26/18 1742 02/26/18 2104 02/27/18 0741 02/27/18 0821 02/27/18 1000  GLUCAP 80 75 58* 97 11    Microbiology: Results for orders placed or performed during the hospital encounter of 02/21/18  MRSA PCR Screening     Status: None   Collection Time: 02/21/18  4:23 AM  Result Value Ref Range Status   MRSA by PCR NEGATIVE NEGATIVE Final    Comment:        The GeneXpert MRSA Assay (FDA approved for NASAL specimens only), is one component of a comprehensive MRSA colonization surveillance program. It is not intended to diagnose MRSA infection nor to guide or monitor  treatment for MRSA infections. Performed at [facility], [address] [place], [place] [address]     Coagulation Studies: No results for input(s): LABPROT, INR in the last 72 hours.  Urinalysis: No results for input(s): COLORURINE, LABSPEC, PHURINE, GLUCOSEU, HGBUR, BILIRUBINUR, KETONESUR, PROTEINUR, UROBILINOGEN, NITRITE, LEUKOCYTESUR in the last 72 hours.  Invalid input(s): APPERANCEUR    Imaging: Dg Chest Port 1 View  Result Date: 02/26/2018 CLINICAL DATA:  Shortness of breath. EXAM: PORTABLE CHEST 1 VIEW COMPARISON:  Single-view of the chest 02/21/2018 and PA and lateral chest 10/01/2015. FINDINGS: Endotracheal tube and NG tube present on the most recent examination has been removed. Right subclavian central venous catheter remains in place. Aeration in the right mid and lower lung zones is markedly improved since the most recent exam. There is mild left basilar atelectasis. Heart size is normal. No pneumothorax. IMPRESSION: New small left pleural effusion.  No other new abnormality. Right effusion and airspace disease have markedly improved. Electronically Signed   By: Inge Rise M.D.   On: 02/26/2018 11:29     Medications:   . ampicillin-sulbactam (UNASYN) IV Stopped (02/27/18 0610)   . apixaban  5 mg Oral BID  . docusate sodium  100 mg Oral BID  . insulin aspart  0-15 Units Subcutaneous TID WC  . insulin aspart  0-5 Units Subcutaneous QHS  . methylPREDNISolone (SOLU-MEDROL) injection  40 mg Intravenous Daily  . pantoprazole  40 mg Oral Daily  . QUEtiapine  100 mg Oral QHS  . QUEtiapine  50 mg Oral Once  . sertraline  50 mg Oral Daily   bisacodyl, fentaNYL, hydrALAZINE, ipratropium-albuterol, LORazepam, [DISCONTINUED] ondansetron **OR** ondansetron (ZOFRAN) IV, sodium chloride flush  Assessment/ Plan:  Mr. BRENYN PETREY is a 57 y.o. white male with lupus nephritis on mycophenolate, DVT/pulmonary embolus, hypertension, hyperlipidemia admitted to [facility]  ICU on 02/21/2018. Intubated and sedated.   1. Acute Renal Failure on chronic kidney disease stage III: baseline creatinine of 1.75, GFR of 44 on 10/2017.  Chronic kidney disease stage III  with hematuria and proteinuria secondary to lupus nephritis.  Currently off cellcept.   Acute renal failure from ATN from acute illness, prerenal azotemia and hypoxia -  Patient was on CellCept as an outpatient.  Hold off on adding this back until infection issues are improved. -May discontinue the femoral dialysis catheter as no further need for dialysis is anticipated Serum creatinine is back to baseline of 1.7 (baseline from November 2018)  2. Acute respiratory failure requiring mechanical ventilation: has been extubated.  Continues to do well off the ventilator.  3.  Hypotension.    Continue to monitor blood pressure.. Patient does report nausea and vomiting today but states he is able to tolerate clears If that becomes an issue, may need to start IV fluid supplementation.   LOS: 6 Jonathon Snow 3/9/201910:34 AM

## 2018-02-28 ENCOUNTER — Inpatient Hospital Stay: Payer: 59

## 2018-02-28 LAB — GLUCOSE, CAPILLARY
GLUCOSE-CAPILLARY: 133 mg/dL — AB (ref 65–99)
GLUCOSE-CAPILLARY: 106 mg/dL — AB (ref 65–99)
GLUCOSE-CAPILLARY: 98 mg/dL (ref 65–99)
Glucose-Capillary: 125 mg/dL — ABNORMAL HIGH (ref 65–99)

## 2018-02-28 LAB — BASIC METABOLIC PANEL
Anion gap: 10 (ref 5–15)
BUN: 45 mg/dL — AB (ref 6–20)
CO2: 27 mmol/L (ref 22–32)
Calcium: 8 mg/dL — ABNORMAL LOW (ref 8.9–10.3)
Chloride: 107 mmol/L (ref 101–111)
Creatinine, Ser: 1.5 mg/dL — ABNORMAL HIGH (ref 0.61–1.24)
GFR calc Af Amer: 58 mL/min — ABNORMAL LOW (ref >60)
GFR, EST NON AFRICAN AMERICAN: 50 mL/min — AB (ref >60)
Glucose, Bld: 97 mg/dL (ref 65–99)
Potassium: 3.4 mmol/L — ABNORMAL LOW (ref 3.5–5.1)
SODIUM: 144 mmol/L (ref 135–145)

## 2018-02-28 MED ORDER — KCL IN DEXTROSE-NACL 20-5-0.45 MEQ/L-%-% IV SOLN
INTRAVENOUS | Status: DC
  Administered 2018-02-28 (×2): via INTRAVENOUS
  Filled 2018-02-28 (×4): qty 1000

## 2018-02-28 MED ORDER — TRAMADOL HCL 50 MG PO TABS: 50.0000 mg | ORAL_TABLET | Freq: Four times a day (QID) | ORAL | Status: DC | PRN

## 2018-02-28 MED ORDER — PREDNISONE 50 MG PO TABS
50.0000 mg | ORAL_TABLET | Freq: Every day | ORAL | Status: DC
  Administered 2018-03-01 – 2018-03-03 (×3): 50 mg via ORAL
  Filled 2018-02-28 (×3): qty 1

## 2018-02-28 MED ORDER — IOPAMIDOL (ISOVUE-300) INJECTION 61%
15.0000 mL | INTRAVENOUS | Status: AC
  Administered 2018-02-28 (×2): 15 mL via ORAL

## 2018-02-28 NOTE — Progress Notes (Signed)
[place] [facility] [place] NAME: Jonathon Snow    MR#:  [id number]  DATE OF BIRTH:  [birth year]  SUBJECTIVE:  CHIEF COMPLAINT:   Chief Complaint  Patient presents with  . Unresponsive  Patient is reporting nausea and left lower quadrant abdominal pain 7 out of 10 , continue to hold off on CellCept until infection is treated with course of antibiotics According to the nurses report patient is eating candy and also constipated  REVIEW OF SYSTEMS:  CONSTITUTIONAL: No fever, fatigue or weakness.  EYES: No blurred or double vision.  EARS, NOSE, AND THROAT: No tinnitus or ear pain.  RESPIRATORY: No cough, shortness of breath, wheezing or hemoptysis.  CARDIOVASCULAR: No chest pain, orthopnea, edema.  GASTROINTESTINAL: Reporting nausea and left lower quadrant abdominal pain denies any  diarrhea intermittent episodes of vomiting GENITOURINARY: No dysuria, hematuria.  ENDOCRINE: No polyuria, nocturia,  HEMATOLOGY: No anemia, easy bruising or bleeding SKIN: No rash or lesion. MUSCULOSKELETAL: No joint pain or arthritis.   NEUROLOGIC: No tingling, numbness, weakness.  PSYCHIATRY: No anxiety or depression.   ROS  DRUG ALLERGIES:   Allergies  Allergen Reactions  . Hydrocodone Rash  . Vicodin [Hydrocodone-Acetaminophen] Hives and Rash    Severe headaches (also)    VITALS:  Blood pressure (!) 164/70, pulse 91, temperature (!) 97.5 F (36.4 C), temperature source Oral, resp. rate 18, height 6' (1.829 m), weight 107.2 kg (236 lb 6.4 oz), SpO2 90 %.  PHYSICAL EXAMINATION:  GENERAL:  57 y.o.-year-old patient lying in the bed with no acute distress.  EYES: Pupils equal, round, reactive to light and accommodation. No scleral icterus. Extraocular muscles intact.  HEENT: Head atraumatic, normocephalic. Oropharynx and nasopharynx clear.  NECK:  Supple, no jugular venous distention. No thyroid enlargement, no tenderness.  LUNGS: Normal breath sounds  bilaterally, no wheezing, rales,rhonchi or crepitation. No use of accessory muscles of respiration.  CARDIOVASCULAR: S1, S2 normal. No murmurs, rubs, or gallops.  ABDOMEN: Soft,  minimal left lower quadrant tenderness is present, no rebound tenderness , nondistended. Bowel sounds present. EXTREMITIES: No pedal edema, cyanosis, or clubbing.  NEUROLOGIC: Cranial nerves II through XII are intact. Muscle strength generalized weakness in all extremities. Sensation intact. Gait not checked.  PSYCHIATRIC: The patient is alert and oriented x 3.  SKIN: No obvious rash, lesion, or ulcer.   Physical Exam LABORATORY PANEL:   CBC Recent Labs  Lab 02/26/18 0435  WBC 13.5*  HGB 12.4*  HCT 39.1*  PLT 105*   ------------------------------------------------------------------------------------------------------------------  Chemistries  Recent Labs  Lab 02/23/18 0144  02/28/18 1114  NA 139   < > 144  K 3.9   < > 3.4*  CL 99*   < > 107  CO2 28   < > 27  GLUCOSE 151*   < > 97  BUN 43*   < > 45*  CREATININE 2.39*   < > 1.50*  CALCIUM 8.0*   < > 8.0*  MG 2.1  --   --    < > = values in this interval not displayed.   ------------------------------------------------------------------------------------------------------------------  Cardiac Enzymes Recent Labs  Lab 02/21/18 1720  TROPONINI 0.07*   ------------------------------------------------------------------------------------------------------------------  RADIOLOGY:  Dg Chest Port 1 View  Result Date: 02/28/2018 CLINICAL DATA:  Cough.  Pneumonia. EXAM: PORTABLE CHEST 1 VIEW COMPARISON:  02/26/2018 FINDINGS: Right jugular catheter terminates over the lower SVC. The cardiac silhouette is normal in size. There is improved aeration of the lung bases with mild residual  opacity bilaterally. Pleural effusions have also decreased or resolved. No pneumothorax is identified. IMPRESSION: Improved aeration of the lung bases with mild residual  opacity, likely atelectasis. Electronically Signed   By: Logan Bores M.D.   On: 02/28/2018 11:51    ASSESSMENT AND PLAN:  57 year old male patient with history of DVT, pulmonary embolism, CKD stage III, lupus erythematosus presented to the emergency room with decreased responsiveness.   #.Acute hypoxic respiratory failure  Stable  Secondary to pneumonia Status post extubation on February 24, 2018, continue to wean O2 as tolerated  On Unasyn  #.Acute encephalopathy/altered mental status Resolved Most likely secondary to pneumonia with associated acute hypoxic respiratory failure  #Acute left lower side abdominal pain with constipation h/o ch nausea Will give stool softeners CT abdomen and pain management as needed  reinforced the importance of not eating candy  #Acute aspiration pneumonia Resolving Continue empiricantibiotics on ourpneumonia protocol  4.Acute septic shock Resolved Secondary toacute pneumonia Successfully weaned off pressors, antibiotics per above, continue sepsis protocol  5.ARF/AKI w/ CKD III Improved , creatinine at patient's baseline 1.5 Patient is started on IV fluids as patient is nauseous and with decreased p.o. intake Secondary to ATN from acute illness  Treated with CRRT-discontinued on March 25, 2018 Nephrology input greatly appreciated-Lasix discontinued  6.Lupus erythematosus Stable CellCept on hold until antibiotic course is completed for infection  7.History of DVT and pulmonary embolism Continue Eliquis twice daily  8.  Prolonged delirium seen by psychiatry History of narcotic abuse in the past.  Not recommending any new medications will continue to follow with [place]  Generalized weakness PT is recommending skilled nursing facility  all the records are reviewed and case discussed with Care Management/Social Workerr. Management plans discussed with the patient, and daughter Jerl Santos -[phone] and they are  in agreement.  CODE STATUS: full  TOTAL TIME TAKING CARE OF THIS PATIENT: 34 minutes.     POSSIBLE D/C IN 1-2 DAYS, DEPENDING ON CLINICAL CONDITION.   [name] M.D on 02/28/2018   Between 7am to 6pm - Pager - [phone]  After 6pm go to [email or web address] - password [email or web address] [facility]  Office  [phone]  CC: Primary care physician; Arnetha Courser, MD  Note: This dictation was prepared with Dragon dictation along with smaller phrase technology. Any transcriptional errors that result from this process are unintentional.

## 2018-02-28 NOTE — Progress Notes (Signed)
[name]  ROUNDING NOTE   Subjective:   UOP 2300 cc Serum creatinine now lower than his usual baseline at 1.50  Potassium is low at 3.4 Patient reports feeling nauseous and does not feel well overall He is able to tolerate clears No leg edema He has a dry cough  Objective:  Vital signs in last 24 hours:  Temp:  [97.5 F (36.4 C)-98.1 F (36.7 C)] 97.5 F (36.4 C) (03/10 0358) Pulse Rate:  [73-91] 91 (03/10 0358) Resp:  [18-20] 18 (03/10 0358) BP: (134-164)/(65-73) 164/70 (03/10 0358) SpO2:  [90 %-98 %] 90 % (03/10 0358) Weight:  [107.2 kg (236 lb 6.4 oz)] 107.2 kg (236 lb 6.4 oz) (03/10 0300)  Weight change: -4.47 kg (-13.7 oz) Filed Weights   02/26/18 1300 02/27/18 0511 02/28/18 0300  Weight: 111.7 kg (246 lb 4.1 oz) 105.4 kg (232 lb 6.4 oz) 107.2 kg (236 lb 6.4 oz)    Intake/Output: I/O last 3 completed shifts: In: 35 [P.O.:120; IV Piggyback:100] Out: 4202 [Urine:4200; Emesis/NG output:1; Stool:1]   Intake/Output this shift:  No intake/output data recorded.  Physical Exam: General: No acute distress  Head: Hearing intact, OM moist  Eyes: Anicteric  Neck:  Supple  Lungs:   Coarse at bases, normal effort  Heart: Regular rate and rhythm  Abdomen:  Soft, nontender, BS present  Extremities: trace peripheral edema.  Neurologic: Alert and able to follow commands  Skin: No lesions       Basic Metabolic Panel: Recent Labs  Lab 02/21/18 2150 02/22/18 0048 02/22/18 0452 02/22/18 0817 02/22/18 1152 02/23/18 0144 02/24/18 0506 02/25/18 0351 02/26/18 0930 02/28/18 1114  NA 140 140 138 139 139 139 141 144 147* 144  K 5.4* 4.7 4.3 4.1 4.0 3.9 3.3* 3.7 3.7 3.4*  CL 107 107 104 103 100* 99* 96* 95* 101 107  CO2 23 25 25 27 29 28  33* 37* 33* 27  GLUCOSE 208* 213* 189* 215* 174* 151* 142* 157* 137* 97  BUN 34* 32* 31* 27* 25* 43* 69* 85* 78* 45*  CREATININE 2.20* 1.78* 1.73* 1.63* 1.56* 2.39* 2.03* 2.00* 1.74* 1.50*  CALCIUM 7.5* 7.4* 7.4* 7.5*  7.9* 8.0* 8.0* 8.3* 8.4* 8.0*  MG  --  1.5* 2.8* 2.2 2.1 2.1  --   --   --   --   PHOS 3.8 3.4 3.5 3.1 2.6  --   --   --   --   --     Liver Function Tests: Recent Labs  Lab 02/21/18 2150 02/22/18 0048 02/22/18 0452 02/22/18 0817 02/22/18 1152  ALBUMIN 2.8* 2.8* 2.7* 2.7* 2.7*   No results for input(s): LIPASE, AMYLASE in the last 168 hours. No results for input(s): AMMONIA in the last 168 hours.  CBC: Recent Labs  Lab 02/22/18 0452 02/23/18 0144 02/24/18 0506 02/25/18 0351 02/26/18 0435  WBC 15.5* 23.3* 19.1* 12.9* 13.5*  HGB 13.1 12.1* 11.5* 12.4* 12.4*  HCT 40.7 38.9* 36.0* 38.1* 39.1*  MCV 90.8 90.4 88.9 88.9 89.8  PLT 104* 102* 108* 107* 105*    Cardiac Enzymes: Recent Labs  Lab 02/21/18 1209 02/21/18 1720  TROPONINI 0.06* 0.07*    BNP: Invalid input(s): POCBNP  CBG: Recent Labs  Lab 02/27/18 1000 02/27/18 1223 02/27/18 1642 02/27/18 2111 02/28/18 0739  GLUCAP 77 91 114* 154* 133*    Microbiology: Results for orders placed or performed during the hospital encounter of 02/21/18  MRSA PCR Screening     Status: None   Collection Time:  02/21/18  4:23 AM  Result Value Ref Range Status   MRSA by PCR NEGATIVE NEGATIVE Final    Comment:        The GeneXpert MRSA Assay (FDA approved for NASAL specimens only), is one component of a comprehensive MRSA colonization surveillance program. It is not intended to diagnose MRSA infection nor to guide or monitor treatment for MRSA infections. Performed at [facility], [address] [place], [place] [address]     Coagulation Studies: No results for input(s): LABPROT, INR in the last 72 hours.  Urinalysis: No results for input(s): COLORURINE, LABSPEC, PHURINE, GLUCOSEU, HGBUR, BILIRUBINUR, KETONESUR, PROTEINUR, UROBILINOGEN, NITRITE, LEUKOCYTESUR in the last 72 hours.  Invalid input(s): APPERANCEUR    Imaging: Dg Chest Port 1 View  Result Date: 02/28/2018 CLINICAL DATA:  Cough.   Pneumonia. EXAM: PORTABLE CHEST 1 VIEW COMPARISON:  02/26/2018 FINDINGS: Right jugular catheter terminates over the lower SVC. The cardiac silhouette is normal in size. There is improved aeration of the lung bases with mild residual opacity bilaterally. Pleural effusions have also decreased or resolved. No pneumothorax is identified. IMPRESSION: Improved aeration of the lung bases with mild residual opacity, likely atelectasis. Electronically Signed   By: Logan Bores M.D.   On: 02/28/2018 11:51     Medications:   . ampicillin-sulbactam (UNASYN) IV Stopped (02/28/18 0086)  . dextrose 5 % and 0.45 % NaCl with KCl 20 mEq/L     . apixaban  5 mg Oral BID  . docusate sodium  100 mg Oral BID  . insulin aspart  0-15 Units Subcutaneous TID WC  . insulin aspart  0-5 Units Subcutaneous QHS  . methylPREDNISolone (SOLU-MEDROL) injection  40 mg Intravenous Daily  . pantoprazole  40 mg Oral Daily  . QUEtiapine  100 mg Oral QHS  . QUEtiapine  50 mg Oral Once  . sertraline  50 mg Oral Daily   bisacodyl, fentaNYL, guaiFENesin, hydrALAZINE, ipratropium-albuterol, LORazepam, [DISCONTINUED] ondansetron **OR** ondansetron (ZOFRAN) IV, sodium chloride flush  Assessment/ Plan:  Mr. DOMINIE BENEDICK is a 57 y.o. white male with lupus nephritis on mycophenolate, DVT/pulmonary embolus, hypertension, hyperlipidemia admitted to [facility] ICU on 02/21/2018. Intubated and sedated.   1. Acute Renal Failure on chronic kidney disease stage 3 Acute renal failure has improved Baseline creatinine appears to be 1.50 as noted today/GFR 50 Chronic kidney disease stage III with hematuria and proteinuria secondary to lupus nephritis.  Currently off cellcept.   Acute renal failure from ATN from acute illness, prerenal azotemia and hypoxia  -  Patient was on CellCept as an outpatient.  Hold off on adding this back until infection issues are improved.  2. Acute respiratory failure requiring mechanical ventilation: has been extubated.   Continues to do well off the ventilator.  3.  Hypokalemia In light of ongoing nausea and decreased oral intake, start IV fluid with potassium chloride  LOS: 7 Sollie Vultaggio 3/10/201911:55 AM

## 2018-02-28 NOTE — Plan of Care (Signed)
  Safety: Ability to remain free from injury will improve 02/28/2018 1623 - Progressing by Oris Drone, RN  Pt remains on Moderate Fall Risks this shift; oob to bsc x 1 max assist Clinical Measurements: Diagnostic test results will improve 02/28/2018 1623 - Not Progressing by Oris Drone, RN  Pt had PCXR performed this shift as ordered in by MD; CT Abd pending this shift
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
## 2018-03-01 LAB — CBC
HEMATOCRIT: 37 % — AB (ref 40.0–52.0)
Hemoglobin: 12.2 g/dL — ABNORMAL LOW (ref 13.0–18.0)
MCH: 29.4 pg (ref 26.0–34.0)
MCHC: 32.9 g/dL (ref 32.0–36.0)
MCV: 89.2 fL (ref 80.0–100.0)
PLATELETS: 161 10*3/uL (ref 150–440)
RBC: 4.15 MIL/uL — AB (ref 4.40–5.90)
RDW: 14.5 % (ref 11.5–14.5)
WBC: 7.5 10*3/uL (ref 3.8–10.6)

## 2018-03-01 LAB — COMPREHENSIVE METABOLIC PANEL
ALT: 20 U/L (ref 17–63)
AST: 19 U/L (ref 15–41)
Albumin: 2.7 g/dL — ABNORMAL LOW (ref 3.5–5.0)
Alkaline Phosphatase: 46 U/L (ref 38–126)
Anion gap: 10 (ref 5–15)
BILIRUBIN TOTAL: 1 mg/dL (ref 0.3–1.2)
BUN: 35 mg/dL — AB (ref 6–20)
CHLORIDE: 107 mmol/L (ref 101–111)
CO2: 23 mmol/L (ref 22–32)
CREATININE: 1.48 mg/dL — AB (ref 0.61–1.24)
Calcium: 8 mg/dL — ABNORMAL LOW (ref 8.9–10.3)
GFR, EST AFRICAN AMERICAN: 59 mL/min — AB (ref >60)
GFR, EST NON AFRICAN AMERICAN: 51 mL/min — AB (ref >60)
Glucose, Bld: 92 mg/dL (ref 65–99)
Potassium: 3.9 mmol/L (ref 3.5–5.1)
Sodium: 140 mmol/L (ref 135–145)
TOTAL PROTEIN: 6.1 g/dL — AB (ref 6.5–8.1)

## 2018-03-01 LAB — GLUCOSE, CAPILLARY
GLUCOSE-CAPILLARY: 93 mg/dL (ref 65–99)
GLUCOSE-CAPILLARY: 108 mg/dL — AB (ref 65–99)
Glucose-Capillary: 138 mg/dL — ABNORMAL HIGH (ref 65–99)
Glucose-Capillary: 124 mg/dL — ABNORMAL HIGH (ref 65–99)

## 2018-03-01 MED ORDER — FLORANEX PO PACK
1.0000 g | PACK | Freq: Three times a day (TID) | ORAL | Status: DC
  Administered 2018-03-01 – 2018-03-03 (×5): 1 g via ORAL
  Filled 2018-03-01 (×10): qty 1

## 2018-03-01 MED ORDER — MYCOPHENOLATE MOFETIL 250 MG PO CAPS
1000.0000 mg | ORAL_CAPSULE | Freq: Two times a day (BID) | ORAL | Status: DC
  Administered 2018-03-01 – 2018-03-03 (×4): 1000 mg via ORAL
  Filled 2018-03-01 (×5): qty 4

## 2018-03-01 MED ORDER — AMOXICILLIN-POT CLAVULANATE 875-125 MG PO TABS
1.0000 | ORAL_TABLET | Freq: Two times a day (BID) | ORAL | Status: DC
Start: 2018-03-01 — End: 2018-03-03
  Administered 2018-03-01 – 2018-03-03 (×4): 1 via ORAL
  Filled 2018-03-01 (×4): qty 1

## 2018-03-01 NOTE — Progress Notes (Signed)
[name]  ROUNDING NOTE   Subjective:   UOP not recorded  Complains of diarrhea and nausea  Sleepy  Objective:  Vital signs in last 24 hours:  Temp:  [98 F (36.7 C)-98.8 F (37.1 C)] 98.8 F (37.1 C) (03/11 0416) Pulse Rate:  [70-75] 70 (03/11 0416) Resp:  [14-15] 15 (03/11 0416) BP: (139-164)/(74-84) 139/74 (03/11 0416) SpO2:  [88 %-95 %] 93 % (03/11 0539) Weight:  [104.7 kg (230 lb 14.4 oz)] 104.7 kg (230 lb 14.4 oz) (03/11 0520)  Weight change: -2.495 kg (-8 oz) Filed Weights   02/27/18 0511 02/28/18 0300 03/01/18 0520  Weight: 105.4 kg (232 lb 6.4 oz) 107.2 kg (236 lb 6.4 oz) 104.7 kg (230 lb 14.4 oz)    Intake/Output: I/O last 3 completed shifts: In: 2555.5 [P.O.:946; I.V.:1304.5; IV Piggyback:305] Out: 2302 [Urine:2301; Stool:1]   Intake/Output this shift:  Total I/O In: 2212.9 [P.O.:720; I.V.:1492.9] Out: -   Physical Exam: General: No acute distress  Head: Hearing intact, OM moist  Eyes: Anicteric  Neck:  Supple  Lungs:   Coarse at bases, normal effort  Heart: Regular rate and rhythm  Abdomen:  Soft, nontender, BS present  Extremities: trace peripheral edema.  Neurologic: Alert and able to follow commands  Skin: No lesions       Basic Metabolic Panel: Recent Labs  Lab 02/23/18 0144 02/24/18 0506 02/25/18 0351 02/26/18 0930 02/28/18 1114 03/01/18 0508  NA 139 141 144 147* 144 140  K 3.9 3.3* 3.7 3.7 3.4* 3.9  CL 99* 96* 95* 101 107 107  CO2 28 33* 37* 33* 27 23  GLUCOSE 151* 142* 157* 137* 97 92  BUN 43* 69* 85* 78* 45* 35*  CREATININE 2.39* 2.03* 2.00* 1.74* 1.50* 1.48*  CALCIUM 8.0* 8.0* 8.3* 8.4* 8.0* 8.0*  MG 2.1  --   --   --   --   --     Liver Function Tests: Recent Labs  Lab 03/01/18 0508  AST 19  ALT 20  ALKPHOS 46  BILITOT 1.0  PROT 6.1*  ALBUMIN 2.7*   No results for input(s): LIPASE, AMYLASE in the last 168 hours. No results for input(s): AMMONIA in the last 168 hours.  CBC: Recent Labs  Lab  02/23/18 0144 02/24/18 0506 02/25/18 0351 02/26/18 0435 03/01/18 0508  WBC 23.3* 19.1* 12.9* 13.5* 7.5  HGB 12.1* 11.5* 12.4* 12.4* 12.2*  HCT 38.9* 36.0* 38.1* 39.1* 37.0*  MCV 90.4 88.9 88.9 89.8 89.2  PLT 102* 108* 107* 105* 161    Cardiac Enzymes: No results for input(s): CKTOTAL, CKMB, CKMBINDEX, TROPONINI in the last 168 hours.  BNP: Invalid input(s): POCBNP  CBG: Recent Labs  Lab 02/28/18 1211 02/28/18 1632 02/28/18 2109 03/01/18 0742 03/01/18 1138  GLUCAP 106* 125* 64 93 138*    Microbiology: Results for orders placed or performed during the hospital encounter of 02/21/18  MRSA PCR Screening     Status: None   Collection Time: 02/21/18  4:23 AM  Result Value Ref Range Status   MRSA by PCR NEGATIVE NEGATIVE Final    Comment:        The GeneXpert MRSA Assay (FDA approved for NASAL specimens only), is one component of a comprehensive MRSA colonization surveillance program. It is not intended to diagnose MRSA infection nor to guide or monitor treatment for MRSA infections. Performed at [facility], [address] [place], [place] [address]     Coagulation Studies: No results for input(s): LABPROT, INR in the  last 72 hours.  Urinalysis: No results for input(s): COLORURINE, LABSPEC, PHURINE, GLUCOSEU, HGBUR, BILIRUBINUR, KETONESUR, PROTEINUR, UROBILINOGEN, NITRITE, LEUKOCYTESUR in the last 72 hours.  Invalid input(s): APPERANCEUR    Imaging: Ct Abdomen Pelvis Wo Contrast  Result Date: 02/28/2018 CLINICAL DATA:  Left lower quadrant pain EXAM: CT ABDOMEN AND PELVIS WITHOUT CONTRAST TECHNIQUE: Multidetector CT imaging of the abdomen and pelvis was performed following the standard protocol without IV contrast. COMPARISON:  11/06/2017 FINDINGS: Lower chest: Small bilateral pleural effusions are noted. Bibasilar atelectatic changes are again seen. Hepatobiliary: Stable cystic changes are again seen in the posterior aspect of the right lobe of the  liver similar to that noted on prior exam. The gallbladder is within normal limits. Pancreas: Unremarkable. No pancreatic ductal dilatation or surrounding inflammatory changes. Spleen: Normal in size without focal abnormality. Adrenals/Urinary Tract: The adrenal glands are within normal limits. The kidneys are well visualized bilaterally without evidence of renal calculi or urinary tract obstructive changes. The bladder is well distended. A tiny focus of air is noted within the nondependent portion of the bladder likely related to prior instrumentation. Stomach/Bowel: Scattered diverticular change of the colon is noted without evidence of diverticulitis. No obstructive or inflammatory changes are seen. The appendix is within normal limits. Vascular/Lymphatic: Aortic calcifications are noted. An IVC filter is noted in stable position. No significant lymphadenopathy is noted. Reproductive: Prostate is unremarkable. Other: No abdominal wall hernia or abnormality. No abdominopelvic ascites. Soft tissue changes are noted in the right inguinal region which may be related to recent vascular access. Musculoskeletal: Degenerative changes of the lumbar spine are seen. Stable compression deformity of L1 is noted. IMPRESSION: Chronic changes as described above without acute abnormality. Soft tissue changes in the right inguinal region likely related to recent vascular access. Electronically Signed   By: Inez Catalina M.D.   On: 02/28/2018 19:41   Dg Chest Port 1 View  Result Date: 02/28/2018 CLINICAL DATA:  Cough.  Pneumonia. EXAM: PORTABLE CHEST 1 VIEW COMPARISON:  02/26/2018 FINDINGS: Right jugular catheter terminates over the lower SVC. The cardiac silhouette is normal in size. There is improved aeration of the lung bases with mild residual opacity bilaterally. Pleural effusions have also decreased or resolved. No pneumothorax is identified. IMPRESSION: Improved aeration of the lung bases with mild residual opacity,  likely atelectasis. Electronically Signed   By: Logan Bores M.D.   On: 02/28/2018 11:51     Medications:   . ampicillin-sulbactam (UNASYN) IV 3 g (03/01/18 1213)   . apixaban  5 mg Oral BID  . insulin aspart  0-15 Units Subcutaneous TID WC  . insulin aspart  0-5 Units Subcutaneous QHS  . lactobacillus  1 g Oral TID WC  . pantoprazole  40 mg Oral Daily  . predniSONE  50 mg Oral Q breakfast  . QUEtiapine  100 mg Oral QHS  . QUEtiapine  50 mg Oral Once  . sertraline  50 mg Oral Daily   guaiFENesin, hydrALAZINE, ipratropium-albuterol, LORazepam, [DISCONTINUED] ondansetron **OR** ondansetron (ZOFRAN) IV, sodium chloride flush, traMADol  Assessment/ Plan:  [name] is a 57 y.o. 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 Intubated and sedated.   1. Acute Renal Failure on chronic kidney disease stage III with hematuria and proteinuria.  Acute renal failure has improved Creatinine at baseline Chronic kidney disease stage III with hematuria and proteinuria secondary to lupus nephritis.  Currently off cellcept.   Acute renal failure  from ATN from acute illness, prerenal azotemia and hypoxia  -  Patient was on CellCept as an outpatient.  Hold off on adding this back until infection issues are improved.   LOS: 8 Eboni Coval 3/11/20191:39 PM

## 2018-03-01 NOTE — Progress Notes (Addendum)
[facility] [place] NAME: Jonathon Snow    MR#:  [id number]  DATE OF BIRTH:  1961/03/26  SUBJECTIVE:  CHIEF COMPLAINT:   Chief Complaint  Patient presents with  . Unresponsive  Patient is reporting nausea is improved and denies any abdominal pain but having multiple episodes of diarrhea since last night.  Reports he has not reported to the nurses  continue to hold off on CellCept until infection is treated with course of antibiotics According to the nurses report patient is eating candy and also constipated  REVIEW OF SYSTEMS:  CONSTITUTIONAL: No fever, fatigue or weakness.  EYES: No blurred or double vision.  EARS, NOSE, AND THROAT: No tinnitus or ear pain.  RESPIRATORY: No cough, shortness of breath, wheezing or hemoptysis.  CARDIOVASCULAR: No chest pain, orthopnea, edema.  GASTROINTESTINAL: Reporting nausea and left lower quadrant abdominal pain denies any  diarrhea intermittent episodes of vomiting GENITOURINARY: No dysuria, hematuria.  ENDOCRINE: No polyuria, nocturia,  HEMATOLOGY: No anemia, easy bruising or bleeding SKIN: No rash or lesion. MUSCULOSKELETAL: No joint pain or arthritis.   NEUROLOGIC: No tingling, numbness, weakness.  PSYCHIATRY: No anxiety or depression.   ROS  DRUG ALLERGIES:   Allergies  Allergen Reactions  . Hydrocodone Rash  . Vicodin [Hydrocodone-Acetaminophen] Hives and Rash    Severe headaches (also)    VITALS:  Blood pressure (!) 142/69, pulse 80, temperature 98.6 F (37 C), temperature source Oral, resp. rate 20, height 6' (1.829 m), weight 104.7 kg (230 lb 14.4 oz), SpO2 93 %.  PHYSICAL EXAMINATION:  GENERAL:  57 y.o.-year-old patient lying in the bed with no acute distress.  EYES: Pupils equal, round, reactive to light and accommodation. No scleral icterus. Extraocular muscles intact.  HEENT: Head atraumatic, normocephalic. Oropharynx and nasopharynx clear.  NECK:  Supple, no jugular venous  distention. No thyroid enlargement, no tenderness.  LUNGS: Normal breath sounds bilaterally, no wheezing, rales,rhonchi or crepitation. No use of accessory muscles of respiration.  CARDIOVASCULAR: S1, S2 normal. No murmurs, rubs, or gallops.  ABDOMEN: Soft,  minimal left lower quadrant tenderness is present, no rebound tenderness , nondistended. Bowel sounds present. EXTREMITIES: No pedal edema, cyanosis, or clubbing.  NEUROLOGIC: Cranial nerves II through XII are intact. Muscle strength generalized weakness in all extremities. Sensation intact. Gait not checked.  PSYCHIATRIC: The patient is alert and oriented x 3.  SKIN: No obvious rash, lesion, or ulcer.   Physical Exam LABORATORY PANEL:   CBC Recent Labs  Lab 03/01/18 0508  WBC 7.5  HGB 12.2*  HCT 37.0*  PLT 161   ------------------------------------------------------------------------------------------------------------------  Chemistries  Recent Labs  Lab 02/23/18 0144  03/01/18 0508  NA 139   < > 140  K 3.9   < > 3.9  CL 99*   < > 107  CO2 28   < > 23  GLUCOSE 151*   < > 92  BUN 43*   < > 35*  CREATININE 2.39*   < > 1.48*  CALCIUM 8.0*   < > 8.0*  MG 2.1  --   --   AST  --   --  19  ALT  --   --  20  ALKPHOS  --   --  46  BILITOT  --   --  1.0   < > = values in this interval not displayed.   ------------------------------------------------------------------------------------------------------------------  Cardiac Enzymes No results for input(s): TROPONINI in the last 168 hours. ------------------------------------------------------------------------------------------------------------------  RADIOLOGY:  Ct  Abdomen Pelvis Wo Contrast  Result Date: 02/28/2018 CLINICAL DATA:  Left lower quadrant pain EXAM: CT ABDOMEN AND PELVIS WITHOUT CONTRAST TECHNIQUE: Multidetector CT imaging of the abdomen and pelvis was performed following the standard protocol without IV contrast. COMPARISON:  11/06/2017 FINDINGS: Lower  chest: Small bilateral pleural effusions are noted. Bibasilar atelectatic changes are again seen. Hepatobiliary: Stable cystic changes are again seen in the posterior aspect of the right lobe of the liver similar to that noted on prior exam. The gallbladder is within normal limits. Pancreas: Unremarkable. No pancreatic ductal dilatation or surrounding inflammatory changes. Spleen: Normal in size without focal abnormality. Adrenals/Urinary Tract: The adrenal glands are within normal limits. The kidneys are well visualized bilaterally without evidence of renal calculi or urinary tract obstructive changes. The bladder is well distended. A tiny focus of air is noted within the nondependent portion of the bladder likely related to prior instrumentation. Stomach/Bowel: Scattered diverticular change of the colon is noted without evidence of diverticulitis. No obstructive or inflammatory changes are seen. The appendix is within normal limits. Vascular/Lymphatic: Aortic calcifications are noted. An IVC filter is noted in stable position. No significant lymphadenopathy is noted. Reproductive: Prostate is unremarkable. Other: No abdominal wall hernia or abnormality. No abdominopelvic ascites. Soft tissue changes are noted in the right inguinal region which may be related to recent vascular access. Musculoskeletal: Degenerative changes of the lumbar spine are seen. Stable compression deformity of L1 is noted. IMPRESSION: Chronic changes as described above without acute abnormality. Soft tissue changes in the right inguinal region likely related to recent vascular access. Electronically Signed   By: Inez Catalina M.D.   On: 02/28/2018 19:41   Dg Chest Port 1 View  Result Date: 02/28/2018 CLINICAL DATA:  Cough.  Pneumonia. EXAM: PORTABLE CHEST 1 VIEW COMPARISON:  02/26/2018 FINDINGS: Right jugular catheter terminates over the lower SVC. The cardiac silhouette is normal in size. There is improved aeration of the lung bases with  mild residual opacity bilaterally. Pleural effusions have also decreased or resolved. No pneumothorax is identified. IMPRESSION: Improved aeration of the lung bases with mild residual opacity, likely atelectasis. Electronically Signed   By: Logan Bores M.D.   On: 02/28/2018 11:51    ASSESSMENT AND PLAN:  57 year old male patient with history of DVT, pulmonary embolism, CKD stage III, lupus erythematosus presented to the emergency room with decreased responsiveness.   #.Acute hypoxic respiratory failure  Stable  Secondary to pneumonia Status post extubation on February 24, 2018, continue to wean O2 as tolerated  On Unasyn-discontinue IV antibiotics.  Patient has received 2 days of vancomycin followed by 4 days of Zosyn and then another 4 days of Unasyn completed 9 days of antibiotics total   #diarrhea Discontinue laxatives and stool softeners Probiotics added to the regimen Check stool for C. difficile toxin and GI panel Reinforced the importance not to eat candies CT abdomen with chronic changes no acute abnormalities   #.Acute encephalopathy/altered mental status Resolved Most likely secondary to pneumonia with associated acute hypoxic respiratory failure  #Acute left lower side abdominal pain with constipation h/o ch nausea Resolved  #Acute aspiration pneumonia Resolving Pleated antibiotic course  4.Acute septic shock Resolved Secondary toacute pneumonia Successfully weaned off pressors, antibiotics per above, continue sepsis protocol  5.ARF/AKI w/ CKD III Improved , creatinine at patient's baseline 1.5 Patient is started on IV fluids as patient is nauseous and with decreased p.o. intake Secondary to ATN from acute illness  Treated with CRRT-discontinued on March 25, 2018 Nephrology  input greatly appreciated-Lasix discontinued  6.Lupus erythematosus Stable CellCept will be resumed as the patient has completed the antibiotic course  7.History of  DVT and pulmonary embolism Continue Eliquis twice daily  8.  Prolonged delirium seen by psychiatry History of narcotic abuse in the past.  Not recommending any new medications will continue to follow with [place]  Generalized weakness PT is recommending skilled nursing facility  all the records are reviewed and case discussed with Care Management/Social Workerr. Management plans discussed with the patient, and daughter Jonathon Snow -[phone] and they are in agreement.  CODE STATUS: full  TOTAL TIME TAKING CARE OF THIS PATIENT: 34 minutes.     POSSIBLE D/C IN 1-2 DAYS, DEPENDING ON CLINICAL CONDITION.   [name] M.D on 03/01/2018   Between 7am to 6pm - Pager - [phone]  After 6pm go to [email or web address] - password [email or web address] [facility]  Office  [phone]  CC: Primary care physician; Arnetha Courser, MD  Note: This dictation was prepared with Dragon dictation along with smaller phrase technology. Any transcriptional errors that result from this process are unintentional.

## 2018-03-02 ENCOUNTER — Inpatient Hospital Stay: Payer: 59

## 2018-03-02 LAB — C DIFFICILE QUICK SCREEN W PCR REFLEX
C Diff antigen: NEGATIVE
C Diff interpretation: NOT DETECTED
C Diff toxin: NEGATIVE

## 2018-03-02 LAB — INFLUENZA PANEL BY PCR (TYPE A & B)
INFLAPCR: POSITIVE — AB
INFLBPCR: NEGATIVE

## 2018-03-02 LAB — GLUCOSE, CAPILLARY
GLUCOSE-CAPILLARY: 85 mg/dL (ref 65–99)
Glucose-Capillary: 117 mg/dL — ABNORMAL HIGH (ref 65–99)
Glucose-Capillary: 126 mg/dL — ABNORMAL HIGH (ref 65–99)
Glucose-Capillary: 143 mg/dL — ABNORMAL HIGH (ref 65–99)

## 2018-03-02 MED ORDER — OSELTAMIVIR PHOSPHATE 75 MG PO CAPS
75.0000 mg | ORAL_CAPSULE | Freq: Two times a day (BID) | ORAL | Status: DC
  Administered 2018-03-02 – 2018-03-03 (×2): 75 mg via ORAL
  Filled 2018-03-02 (×3): qty 1

## 2018-03-02 NOTE — Progress Notes (Signed)
PT Cancellation Note  Patient Details Name: MCKADE GURKA MRN: [id number] DOB: [birth year]   Cancelled Treatment:    Reason Eval/Treat Not Completed: Patient at procedure or test/unavailable.  Chart reviewed, pt out of room for imaging.  PT will re-attempt later if time allows.   Roxanne Gates, PT, DPT 03/02/2018, 3:17 PM

## 2018-03-02 NOTE — Progress Notes (Signed)
[facility] [place] NAME: Jonathon Snow    MR#:  [id number]  DATE OF BIRTH:  [birth year]  SUBJECTIVE:  CHIEF COMPLAINT:   Chief Complaint  Patient presents with  . Unresponsive  Patient is reporting cough and congestion, still having diarrhea but somewhat better.  Tolerating diet.  REVIEW OF SYSTEMS:  CONSTITUTIONAL: No fever, fatigue or weakness.  EYES: No blurred or double vision.  EARS, NOSE, AND THROAT: No tinnitus or ear pain.  Reporting congestion RESPIRATORY:  reports cough , no shortness of breath, wheezing or hemoptysis.  CARDIOVASCULAR: No chest pain, orthopnea, edema.  GASTROINTESTINAL: Reporting nausea and left lower quadrant abdominal pain denies any  diarrhea intermittent episodes of vomiting GENITOURINARY: No dysuria, hematuria.  ENDOCRINE: No polyuria, nocturia,  HEMATOLOGY: No anemia, easy bruising or bleeding SKIN: No rash or lesion. MUSCULOSKELETAL: No joint pain or arthritis.   NEUROLOGIC: No tingling, numbness, weakness.  PSYCHIATRY: No anxiety or depression.   ROS  DRUG ALLERGIES:   Allergies  Allergen Reactions  . Hydrocodone Rash  . Vicodin [Hydrocodone-Acetaminophen] Hives and Rash    Severe headaches (also)    VITALS:  Blood pressure (!) 154/72, pulse 85, temperature 98.2 F (36.8 C), temperature source Oral, resp. rate 20, height 6' (1.829 m), weight 104.6 kg (230 lb 8 oz), SpO2 92 %.  PHYSICAL EXAMINATION:  GENERAL:  57 y.o.-year-old patient lying in the bed with no acute distress.  EYES: Pupils equal, round, reactive to light and accommodation. No scleral icterus. Extraocular muscles intact.  HEENT: Head atraumatic, normocephalic. Oropharynx and nasopharynx clear.  NECK:  Supple, no jugular venous distention. No thyroid enlargement, no tenderness.  LUNGS: Normal breath sounds bilaterally, no wheezing, rales,rhonchi or crepitation. No use of accessory muscles of respiration.  CARDIOVASCULAR: S1, S2  normal. No murmurs, rubs, or gallops.  ABDOMEN: Soft,  minimal left lower quadrant tenderness is present, no rebound tenderness , nondistended. Bowel sounds present. EXTREMITIES: No pedal edema, cyanosis, or clubbing.  NEUROLOGIC: Cranial nerves II through XII are intact. Muscle strength generalized weakness in all extremities. Sensation intact. Gait not checked.  PSYCHIATRIC: The patient is alert and oriented x 3.  SKIN: No obvious rash, lesion, or ulcer.   Physical Exam LABORATORY PANEL:   CBC Recent Labs  Lab 03/01/18 0508  WBC 7.5  HGB 12.2*  HCT 37.0*  PLT 161   ------------------------------------------------------------------------------------------------------------------  Chemistries  Recent Labs  Lab 03/01/18 0508  NA 140  K 3.9  CL 107  CO2 23  GLUCOSE 92  BUN 35*  CREATININE 1.48*  CALCIUM 8.0*  AST 19  ALT 20  ALKPHOS 46  BILITOT 1.0   ------------------------------------------------------------------------------------------------------------------  Cardiac Enzymes No results for input(s): TROPONINI in the last 168 hours. ------------------------------------------------------------------------------------------------------------------  RADIOLOGY:  Ct Abdomen Pelvis Wo Contrast  Result Date: 02/28/2018 CLINICAL DATA:  Left lower quadrant pain EXAM: CT ABDOMEN AND PELVIS WITHOUT CONTRAST TECHNIQUE: Multidetector CT imaging of the abdomen and pelvis was performed following the standard protocol without IV contrast. COMPARISON:  11/06/2017 FINDINGS: Lower chest: Small bilateral pleural effusions are noted. Bibasilar atelectatic changes are again seen. Hepatobiliary: Stable cystic changes are again seen in the posterior aspect of the right lobe of the liver similar to that noted on prior exam. The gallbladder is within normal limits. Pancreas: Unremarkable. No pancreatic ductal dilatation or surrounding inflammatory changes. Spleen: Normal in size without focal  abnormality. Adrenals/Urinary Tract: The adrenal glands are within normal limits. The kidneys are well visualized bilaterally without  evidence of renal calculi or urinary tract obstructive changes. The bladder is well distended. A tiny focus of air is noted within the nondependent portion of the bladder likely related to prior instrumentation. Stomach/Bowel: Scattered diverticular change of the colon is noted without evidence of diverticulitis. No obstructive or inflammatory changes are seen. The appendix is within normal limits. Vascular/Lymphatic: Aortic calcifications are noted. An IVC filter is noted in stable position. No significant lymphadenopathy is noted. Reproductive: Prostate is unremarkable. Other: No abdominal wall hernia or abnormality. No abdominopelvic ascites. Soft tissue changes are noted in the right inguinal region which may be related to recent vascular access. Musculoskeletal: Degenerative changes of the lumbar spine are seen. Stable compression deformity of L1 is noted. IMPRESSION: Chronic changes as described above without acute abnormality. Soft tissue changes in the right inguinal region likely related to recent vascular access. Electronically Signed   By: Inez Catalina M.D.   On: 02/28/2018 19:41    ASSESSMENT AND PLAN:  57 year old male patient with history of DVT, pulmonary embolism, CKD stage III, lupus erythematosus presented to the emergency room with decreased responsiveness.   #.Acute hypoxic respiratory failure  Stable  Secondary to pneumonia Status post extubation on February 24, 2018, continue to wean O2 as tolerated  On Unasyn-discontinue IV antibiotics.  Patient has received 2 days of vancomycin followed by 4 days of Zosyn and then another 4 days of Unasyn completed 9 days of antibiotics total   #diarrhea Discontinue laxatives and stool softeners Probiotics added to the regimen Check stool for C. difficile toxin and GI panel Reinforced the importance not to eat  candies CT abdomen with chronic changes no acute abnormalities   #.Acute encephalopathy/altered mental status Resolved Most likely secondary to pneumonia with associated acute hypoxic respiratory failure  #Acute left lower side abdominal pain with constipation h/o ch nausea Resolved  #Acute aspiration pneumonia with a new onset cough and congestion Will check for flu and get a chest x-ray Pleated antibiotic course  4.Acute septic shock Resolved Secondary toacute pneumonia Successfully weaned off pressors, antibiotics per above, continue sepsis protocol  5.ARF/AKI w/ CKD III Improved , creatinine at patient's baseline 1.5 Patient is started on IV fluids as patient is nauseous and with decreased p.o. intake Secondary to ATN from acute illness  Treated with CRRT-discontinued on March 25, 2018 Nephrology input greatly appreciated-Lasix discontinued  6.Lupus erythematosus Stable CellCept will be resumed as the patient has completed the antibiotic course  7.History of DVT and pulmonary embolism Continue Eliquis twice daily  8.  Prolonged delirium seen by psychiatry History of narcotic abuse in the past.  Not recommending any new medications will continue to follow with [place]  Generalized weakness PT is recommending skilled nursing facility, patient and daughter are agreeable.  Follow-up with case management  all the records are reviewed and case discussed with Care Management/Social Workerr. Management plans discussed with the patient, and daughter Jerl Santos -[phone] and they are in agreement.  CODE STATUS: full  TOTAL TIME TAKING CARE OF THIS PATIENT: 34 minutes.     POSSIBLE D/C IN 1-2 DAYS, DEPENDING ON CLINICAL CONDITION.   [name] M.D on 03/02/2018   Between 7am to 6pm - Pager - [phone]  After 6pm go to [email or web address] - password [email or web address] [facility]  Office  [phone]  CC: Primary care physician; Arnetha Courser, MD  Note: This dictation was prepared with Dragon dictation along with smaller phrase technology. Any transcriptional errors that result from this process  are unintentional.

## 2018-03-02 NOTE — Progress Notes (Signed)
[name]  ROUNDING NOTE   Subjective:   Daughter at bedside.   Objective:  Vital signs in last 24 hours:  Temp:  [97.8 F (36.6 C)-98.5 F (36.9 C)] 98.2 F (36.8 C) (03/12 1359) Pulse Rate:  [66-85] 85 (03/12 1359) Resp:  [13-22] 20 (03/12 1359) BP: (140-168)/(72-84) 154/72 (03/12 1359) SpO2:  [92 %-100 %] 92 % (03/12 1359) Weight:  [104.6 kg (230 lb 8 oz)] 104.6 kg (230 lb 8 oz) (03/12 0353)  Weight change: -0.181 kg (-6.4 oz) Filed Weights   02/28/18 0300 03/01/18 0520 03/02/18 0353  Weight: 107.2 kg (236 lb 6.4 oz) 104.7 kg (230 lb 14.4 oz) 104.6 kg (230 lb 8 oz)    Intake/Output: I/O last 3 completed shifts: In: 3042.9 [P.O.:720; I.V.:2222.9; IV Piggyback:100] Out: 800 [Urine:800]   Intake/Output this shift:  Total I/O In: -  Out: 500 [Urine:500]  Physical Exam: General: No acute distress  Head: Hearing intact, OM moist  Eyes: Anicteric  Neck:  Supple  Lungs:  Bilateral wheezing  Heart: Regular rate and rhythm  Abdomen:  Soft, nontender, BS present  Extremities: No peripheral edema.  Neurologic: Alert and able to follow commands  Skin: No lesions       Basic Metabolic Panel: Recent Labs  Lab 02/24/18 0506 02/25/18 0351 02/26/18 0930 02/28/18 1114 03/01/18 0508  NA 141 144 147* 144 140  K 3.3* 3.7 3.7 3.4* 3.9  CL 96* 95* 101 107 107  CO2 33* 37* 33* 27 23  GLUCOSE 142* 157* 137* 97 92  BUN 69* 85* 78* 45* 35*  CREATININE 2.03* 2.00* 1.74* 1.50* 1.48*  CALCIUM 8.0* 8.3* 8.4* 8.0* 8.0*    Liver Function Tests: Recent Labs  Lab 03/01/18 0508  AST 19  ALT 20  ALKPHOS 46  BILITOT 1.0  PROT 6.1*  ALBUMIN 2.7*   No results for input(s): LIPASE, AMYLASE in the last 168 hours. No results for input(s): AMMONIA in the last 168 hours.  CBC: Recent Labs  Lab 02/24/18 0506 02/25/18 0351 02/26/18 0435 03/01/18 0508  WBC 19.1* 12.9* 13.5* 7.5  HGB 11.5* 12.4* 12.4* 12.2*  HCT 36.0* 38.1* 39.1* 37.0*  MCV 88.9 88.9 89.8  89.2  PLT 108* 107* 105* 161    Cardiac Enzymes: No results for input(s): CKTOTAL, CKMB, CKMBINDEX, TROPONINI in the last 168 hours.  BNP: Invalid input(s): POCBNP  CBG: Recent Labs  Lab 03/01/18 1138 03/01/18 1658 03/01/18 2109 03/02/18 0736 03/02/18 1156  GLUCAP 138* 124* 108* 11 117*    Microbiology: Results for orders placed or performed during the hospital encounter of 02/21/18  MRSA PCR Screening     Status: None   Collection Time: 02/21/18  4:23 AM  Result Value Ref Range Status   MRSA by PCR NEGATIVE NEGATIVE Final    Comment:        The GeneXpert MRSA Assay (FDA approved for NASAL specimens only), is one component of a comprehensive MRSA colonization surveillance program. It is not intended to diagnose MRSA infection nor to guide or monitor treatment for MRSA infections. Performed at [facility], [address] [place], [place] [address]   C difficile quick scan w PCR reflex     Status: None   Collection Time: 03/02/18  8:18 AM  Result Value Ref Range Status   C Diff antigen NEGATIVE NEGATIVE Final   C Diff toxin NEGATIVE NEGATIVE Final   C Diff interpretation No C. difficile detected.  Final    Comment: Performed at  [address] [place], [place] [address]    Coagulation Studies: No results for input(s): LABPROT, INR in the last 72 hours.  Urinalysis: No results for input(s): COLORURINE, LABSPEC, PHURINE, GLUCOSEU, HGBUR, BILIRUBINUR, KETONESUR, PROTEINUR, UROBILINOGEN, NITRITE, LEUKOCYTESUR in the last 72 hours.  Invalid input(s): APPERANCEUR    Imaging: Ct Abdomen Pelvis Wo Contrast  Result Date: 02/28/2018 CLINICAL DATA:  Left lower quadrant pain EXAM: CT ABDOMEN AND PELVIS WITHOUT CONTRAST TECHNIQUE: Multidetector CT imaging of the abdomen and pelvis was performed following the standard protocol without IV contrast. COMPARISON:  11/06/2017 FINDINGS: Lower chest: Small bilateral pleural effusions are noted.  Bibasilar atelectatic changes are again seen. Hepatobiliary: Stable cystic changes are again seen in the posterior aspect of the right lobe of the liver similar to that noted on prior exam. The gallbladder is within normal limits. Pancreas: Unremarkable. No pancreatic ductal dilatation or surrounding inflammatory changes. Spleen: Normal in size without focal abnormality. Adrenals/Urinary Tract: The adrenal glands are within normal limits. The kidneys are well visualized bilaterally without evidence of renal calculi or urinary tract obstructive changes. The bladder is well distended. A tiny focus of air is noted within the nondependent portion of the bladder likely related to prior instrumentation. Stomach/Bowel: Scattered diverticular change of the colon is noted without evidence of diverticulitis. No obstructive or inflammatory changes are seen. The appendix is within normal limits. Vascular/Lymphatic: Aortic calcifications are noted. An IVC filter is noted in stable position. No significant lymphadenopathy is noted. Reproductive: Prostate is unremarkable. Other: No abdominal wall hernia or abnormality. No abdominopelvic ascites. Soft tissue changes are noted in the right inguinal region which may be related to recent vascular access. Musculoskeletal: Degenerative changes of the lumbar spine are seen. Stable compression deformity of L1 is noted. IMPRESSION: Chronic changes as described above without acute abnormality. Soft tissue changes in the right inguinal region likely related to recent vascular access. Electronically Signed   By: Inez Catalina M.D.   On: 02/28/2018 19:41   Dg Chest 2 View  Result Date: 03/02/2018 CLINICAL DATA:  Cough and congestion. EXAM: CHEST - 2 VIEW COMPARISON:  02/28/2018. FINDINGS: Right PICC line noted with tip in stable position at cavoatrial junction. Heart size normal. Bibasilar subsegmental again noted. Similar findings on prior exam. No prominent pleural effusion. No  pneumothorax. IMPRESSION: 1. Right PICC line noted with tip at cavoatrial junction in stable position. 2. Low lung volumes with mild bibasilar again noted. Similar findings noted on prior exam. Electronically Signed   By: Van Voorhis   On: 03/02/2018 15:25     Medications:    . amoxicillin-clavulanate  1 tablet Oral Q12H  . apixaban  5 mg Oral BID  . insulin aspart  0-15 Units Subcutaneous TID WC  . insulin aspart  0-5 Units Subcutaneous QHS  . lactobacillus  1 g Oral TID WC  . mycophenolate  1,000 mg Oral BID  . pantoprazole  40 mg Oral Daily  . predniSONE  50 mg Oral Q breakfast  . QUEtiapine  100 mg Oral QHS  . QUEtiapine  50 mg Oral Once  . sertraline  50 mg Oral Daily   guaiFENesin, hydrALAZINE, ipratropium-albuterol, LORazepam, [DISCONTINUED] ondansetron **OR** ondansetron (ZOFRAN) IV, sodium chloride flush, traMADol  Assessment/ Plan:  [name] is a 57 y.o. 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 Intubated and sedated.   1. Acute Renal Failure on chronic kidney disease stage III  with hematuria and proteinuria.  Acute renal failure has improved Creatinine at baseline Chronic kidney disease stage III with hematuria and proteinuria secondary to lupus nephritis.  Currently off cellcept.   Acute renal failure from ATN from acute illness, prerenal azotemia and hypoxia  Restarted on mycophenolate   LOS: 9 Jonathon Snow 3/12/20194:46 PM

## 2018-03-02 NOTE — Progress Notes (Signed)
Physical Therapy Treatment Patient Details Name: Jonathon Snow MRN: [id number] DOB: 1961-04-14 Today's Date: 03/02/2018    History of Present Illness 57 y.o. male with a known history of deep venous thrombosis on Eliquis for anticoagulation, lupus erythematosus, pulmonary embolism, chronic kidney disease stage III, hyperlipidemia, hypertension was brought to the emergency room unresponsive.  The patient was picked up by EMS and they found to be patient in a agonal breathing.  Ultimately needed to be intubated, pt has been aggressive at times during hospitalization.    PT Comments    Pt progressed transfer and ambulation goals today and was able to perform bed exercises, demonstrating understanding of HEP issued by PT.  PT provided education concerning importance of frequency of exercise and how to perform them correctly.  Pt was able to perform STS with RW with supervision from PT on two occasions.  Pt also ambulated 41 ft in rm with supervision and min VC's for safe management of RW.  Pt reported fatigue but no significant increase compared to how he has felt for the past 2-3 days.  Pt stated that he understood the importance of use of RW for management of energy and fall prevention for 2-3 weeks during recovery process.  Due to transfer and gait ability, pt is now a candidate for home health PT.  Pt will continue to benefit from skilled PT with focus on strength, balance and tolerance to activity.   Follow Up Recommendations  Home health PT     Equipment Recommendations  Rolling walker with 5" wheels    Recommendations for Other Services       Precautions / Restrictions Precautions Precautions: Fall Restrictions Weight Bearing Restrictions: No    Mobility  Bed Mobility Overal bed mobility: Independent                Transfers Overall transfer level: Needs assistance Equipment used: Rolling walker (2 wheeled) Transfers: Sit to/from Stand Sit to Stand: Supervision          General transfer comment: Pt was able to stand without physical assist with use of RW.  PT provided supervision for safety precautions.  Ambulation/Gait Ambulation/Gait assistance: Supervision Ambulation Distance (Feet): 80 Feet Assistive device: Rolling walker (2 wheeled)     Gait velocity interpretation: at or above normal speed for age/gender General Gait Details: Pt ambulated with RW demonstrating moderate foot clearance and knee flexion and no significant gait deviations.  PT provided min VC's for upright posture and pt demonstrated safe placement of RW in proximity to body.   Stairs            Wheelchair Mobility    Modified Rankin (Stroke Patients Only)       Balance Overall balance assessment: Modified Independent                                          Cognition Arousal/Alertness: Awake/alert Behavior During Therapy: WFL for tasks assessed/performed Overall Cognitive Status: Within Functional Limits for tasks assessed                                        Exercises General Exercises - Lower Extremity Ankle Circles/Pumps: AROM;20 reps;Supine Quad Sets: Strengthening;Both;10 reps;Supine Short Arc Quad: Strengthening;Both;10 reps;Supine Hip ABduction/ADduction: Strengthening;Both;10 reps;Supine Straight Leg Raises: Strengthening;10 reps;Supine  General Comments        Pertinent Vitals/Pain Pain Assessment: Faces Faces Pain Scale: Hurts a little bit Pain Location: Medial knees Pain Intervention(s): Limited activity within patient's tolerance;Monitored during session    Home Living                      Prior Function            PT Goals (current goals can now be found in the care plan section) Acute Rehab PT Goals Patient Stated Goal: To go home and continue working as soon as possible. PT Goal Formulation: With patient/family Time For Goal Achievement: 03/16/18 Potential to Achieve Goals:  Good Progress towards PT goals: Progressing toward goals    Frequency    Min 2X/week      PT Plan Discharge plan needs to be updated    Co-evaluation              AM-PAC PT "6 Clicks" Daily Activity  Outcome Measure  Difficulty turning over in bed (including adjusting bedclothes, sheets and blankets)?: None Difficulty moving from lying on back to sitting on the side of the bed? : A Little Difficulty sitting down on and standing up from a chair with arms (e.g., wheelchair, bedside commode, etc,.)?: A Little Help needed moving to and from a bed to chair (including a wheelchair)?: A Little Help needed walking in hospital room?: A Little Help needed climbing 3-5 steps with a railing? : A Little 6 Click Score: 19    End of Session Equipment Utilized During Treatment: Gait belt Activity Tolerance: Patient tolerated treatment well Patient left: in bed;with call bell/phone within reach;with bed alarm set   PT Visit Diagnosis: Unsteadiness on feet (R26.81);Muscle weakness (generalized) (M62.81);Pain Pain - Right/Left: Left Pain - part of body: Knee     Time: 4098-1191 PT Time Calculation (min) (ACUTE ONLY): 25 min  Charges:  $Therapeutic Exercise: 8-22 mins $Therapeutic Activity: 8-22 mins                    G Codes:  Functional Assessment Tool Used: AM-PAC 6 Clicks Basic Mobility    Roxanne Gates, PT, DPT    Roxanne Gates 03/02/2018, 4:50 PM

## 2018-03-03 ENCOUNTER — Inpatient Hospital Stay
Admit: 2018-03-03 | Discharge: 2018-03-03 | Disposition: A | Discharge status: Home / Self Care | Payer: 59 | Attending: Internal Medicine | Admitting: Internal Medicine

## 2018-03-03 DIAGNOSIS — M3214 Glomerular disease in systemic lupus erythematosus: Secondary | ICD-10-CM

## 2018-03-03 LAB — GLUCOSE, CAPILLARY
Glucose-Capillary: 83 mg/dL (ref 65–99)
Glucose-Capillary: 121 mg/dL — ABNORMAL HIGH (ref 65–99)
Glucose-Capillary: 137 mg/dL — ABNORMAL HIGH (ref 65–99)

## 2018-03-03 MED ORDER — TECHNETIUM TC 99M DIETHYLENETRIAME-PENTAACETIC ACID
32.4250 | Freq: Once | INTRAVENOUS | Status: AC | PRN
  Administered 2018-03-03: 32.425 via INTRAVENOUS

## 2018-03-03 MED ORDER — OSELTAMIVIR PHOSPHATE 75 MG PO CAPS: 75.0000 mg | ORAL_CAPSULE | Freq: Two times a day (BID) | ORAL | Status: DC

## 2018-03-03 MED ORDER — QUETIAPINE FUMARATE 100 MG PO TABS: 100.0000 mg | ORAL_TABLET | Freq: Every day | ORAL | 0 refills | Status: DC

## 2018-03-03 MED ORDER — OSELTAMIVIR PHOSPHATE 75 MG PO CAPS
75.0000 mg | ORAL_CAPSULE | Freq: Two times a day (BID) | ORAL | Status: DC
  Filled 2018-03-03: qty 1

## 2018-03-03 MED ORDER — PREMIER PROTEIN SHAKE
11.0000 [oz_av] | Freq: Two times a day (BID) | ORAL | Status: DC
  Administered 2018-03-03: 11 [oz_av] via ORAL

## 2018-03-03 MED ORDER — APIXABAN 5 MG PO TABS: 5.0000 mg | ORAL_TABLET | Freq: Two times a day (BID) | ORAL | 2 refills | Status: DC

## 2018-03-03 MED ORDER — ADULT MULTIVITAMIN W/MINERALS CH: 1.0000 | ORAL_TABLET | Freq: Every day | ORAL | Status: DC

## 2018-03-03 MED ORDER — AMOXICILLIN-POT CLAVULANATE 875-125 MG PO TABS: 1.0000 | ORAL_TABLET | Freq: Two times a day (BID) | ORAL | 0 refills | Status: DC

## 2018-03-03 MED ORDER — TECHNETIUM TO 99M ALBUMIN AGGREGATED
4.0000 | Freq: Once | INTRAVENOUS | Status: AC | PRN
  Administered 2018-03-03: 4.038 via INTRAVENOUS

## 2018-03-03 MED ORDER — IPRATROPIUM-ALBUTEROL 0.5-2.5 (3) MG/3ML IN SOLN: 3.0000 mL | RESPIRATORY_TRACT | 3 refills | Status: DC | PRN

## 2018-03-03 MED ORDER — OSELTAMIVIR PHOSPHATE 75 MG PO CAPS: 75.0000 mg | ORAL_CAPSULE | Freq: Two times a day (BID) | ORAL | 0 refills | Status: AC

## 2018-03-03 MED ORDER — AMOXICILLIN-POT CLAVULANATE 875-125 MG PO TABS: 1.0000 | ORAL_TABLET | Freq: Two times a day (BID) | ORAL | 0 refills | Status: AC

## 2018-03-03 NOTE — Discharge Instructions (Signed)
[facility] Stay Proper nutrition can help your body recover from illness and injury.   Foods and beverages high in protein, vitamins, and minerals help rebuild muscle loss, promote healing, & reduce fall risk.   In addition to eating healthy foods, a nutrition shake is an easy, delicious way to get the nutrition you need during and after your hospital stay  It is recommended that you continue to drink 2 bottles per day of:       Premier Protein for at least 1 month (30 days) after your hospital stay   Tips for adding a nutrition shake into your routine: As allowed, drink one with vitamins or medications instead of water or juice Enjoy one as a tasty mid-morning or afternoon snack Drink cold or make a milkshake out of it Drink one instead of milk with cereal or snacks Use as a coffee creamer   Available at the following grocery stores and pharmacies:           * Center [phone]            For COUPONS visit: www.ensure.com/join or [email or web address]-may.[email or web address]/   Suggested Substitutions Ensure Plus = Boost Plus = Carnation Breakfast Essentials = Boost Compact Ensure Active Clear = Boost Breeze Glucerna Shake = Boost Glucose Control = Carnation Breakfast Essentials SUGAR FREE    [employer] [facility] [place] [facility] [place]:  Cardiac diet  DISCHARGE CONDITION:  Stable  ACTIVITY:  Activity as tolerated  OXYGEN:  Home Oxygen: No.   Oxygen Delivery: room air  DISCHARGE LOCATION:  home    ADDITIONAL DISCHARGE INSTRUCTION:   If you experience worsening of your admission symptoms, develop shortness of breath, life threatening emergency, suicidal or homicidal thoughts you must seek medical attention immediately by calling 911 or calling your MD immediately  if  symptoms less severe.  You Must read complete instructions/literature along with all the possible adverse reactions/side effects for all the Medicines you take and that have been prescribed to you. Take any new Medicines after you have completely understood and accpet all the possible adverse reactions/side effects.   Please note  You were cared for by a hospitalist during your hospital stay. If you have any questions about your discharge medications or the care you received while you were in the hospital after you are discharged, you can call the unit and asked to speak with the hospitalist on call if the hospitalist that took care of you is not available. Once you are discharged, your primary care physician will handle any further medical issues. Please note that NO REFILLS for any discharge medications will be authorized once you are discharged, as it is imperative that you return to your primary care physician (or establish a relationship with a primary care physician if you do not have one) for your aftercare needs so that they can reassess your need for medications and monitor your lab values.

## 2018-03-03 NOTE — Progress Notes (Signed)
[place] [facility] [place] was admitted to the Hospital on 02/21/2018 and Discharged  03/03/2018 and should be excused from work/school   for 5 weeks starting  02/21/2018 , may return to work/school without any restrictions.  Call Dustin Flock MD with questions.  Dustin Flock M.D on 03/03/2018,at 4:22 PM  [place] at [facility]  [phone]

## 2018-03-03 NOTE — Progress Notes (Signed)
Nutrition Follow-up  DOCUMENTATION CODES:   Obesity unspecified  INTERVENTION:  Provide Premier Protein po BID, each supplement provides 160 kcal and 30 grams of protein.  Provide daily MVI.  Encouraged adequate intake of calories and protein from meals and oral nutrition supplements.  NUTRITION DIAGNOSIS:   Inadequate oral intake related to decreased appetite as evidenced by per patient/family report.  New nutrition diagnosis.  GOAL:   Patient will meet greater than or equal to 90% of their needs  Progressing.  MONITOR:   PO intake, Supplement acceptance, Labs, Weight trends, I & O's  REASON FOR ASSESSMENT:   Ventilator, Consult, Malnutrition Screening Tool Assessment of nutrition requirement/status, Enteral/tube feeding initiation and management  ASSESSMENT:   57 year old male with PMHx of HTN, pulmonary embolism, DVT on Eliquis, HLD, lupus erythematosus, CKD stage III, GERD, depression who was brought in after being found unresponsive found to have severe respiratory failure with ARDS/ALI and was emergently intubated early AM 3/3, also with renal failure.   -Patient was started on CVVHD evening of 3/3 and was discontinued on 3/4 as UOP improved. -Patient was extubated on 3/6. -Diet was advanced to clear liquids on 3/8 and then soft on 3/9.  Met with patient at bedside. He reports his appetite is "okay" and he is eating about 50% of his meals now. He is eating some of the protein on each tray. He reports initially having some nausea and diarrhea. The nausea is better. Diarrhea is still present but much better. He is also reporting some reflux. Per discussion with RN patient received Protonix earlier today. Patient reports he has been working with PT but is feeling weak. He is unsure if he is going to rehab or going home with home PT. He is amenable to drinking Premier Protein.  Meal Completion: 100% on 3/11  Medications reviewed and include: Augmentin, Eliquis, Novolog  0-15 units TID, Novolog 0-5 units QHS, lactobacillus 1 gram TID with meals, Cellcept, Tamiflu, pantoprazole, prednisone 50 mg daily, Seroquel, Zoloft.  Labs reviewed: CBG 83-143. On 3/11 BUN 35, Creatinine 1.48.  I/O: 500 mL UOP yesterday + 2 occurrences unmeasured UOP; 1 BM yesterday  Weight trend: 102.5 kg on 3/13; -8.9 kg from admission  Discussed with RN.  Diet Order:  DIET SOFT Room service appropriate? Yes; Fluid consistency: Thin  EDUCATION NEEDS:   No education needs have been identified at this time  Skin:  Skin Assessment: Reviewed RN Assessment(scattered ecchymosis)  Last BM:  03/02/2018 - large type 7  Height:   Ht Readings from Last 1 Encounters:  02/26/18 6' (1.829 m)    Weight:   Wt Readings from Last 1 Encounters:  03/03/18 225 lb 14.4 oz (102.5 kg)    Ideal Body Weight:  86.4 kg(calculated with ht of _0  from previous encounter)  BMI:  Body mass index is 30.64 kg/m.  Estimated Nutritional Needs:   Kcal:  2200-2400 (MSJ x 1.1-1.2)  Protein:  100-120 grams (0.9-1.1 grams/kg)  Fluid:  2.1-2.5 L/day (25-30 mL/kg IBW)  Willey Blade, MS, RD, LDN Office: [phone] Pager: [phone] After Hours/Weekend Pager: [phone]

## 2018-03-03 NOTE — Progress Notes (Signed)
[name]  ROUNDING NOTE   Subjective:   No new labs.   Objective:  Vital signs in last 24 hours:  Temp:  [97.9 F (36.6 C)-98.4 F (36.9 C)] 98.4 F (36.9 C) (03/13 1427) Pulse Rate:  [79-93] 93 (03/13 1427) Resp:  [15-20] 15 (03/13 1427) BP: (132-159)/(75-87) 149/78 (03/13 1427) SpO2:  [95 %-97 %] 95 % (03/13 1427) Weight:  [102.5 kg (225 lb 14.4 oz)] 102.5 kg (225 lb 14.4 oz) (03/13 0348)  Weight change: -2.087 kg (-9.6 oz) Filed Weights   03/01/18 0520 03/02/18 0353 03/03/18 0348  Weight: 104.7 kg (230 lb 14.4 oz) 104.6 kg (230 lb 8 oz) 102.5 kg (225 lb 14.4 oz)    Intake/Output: I/O last 3 completed shifts: In: -  Out: 900 [Urine:900]   Intake/Output this shift:  No intake/output data recorded.  Physical Exam: General: No acute distress  Head: Hearing intact, OM moist  Eyes: Anicteric  Neck:  Supple  Lungs:  Bilateral wheezing  Heart: Regular rate and rhythm  Abdomen:  Soft, nontender, BS present  Extremities: No peripheral edema.  Neurologic: Alert and able to follow commands  Skin: No lesions       Basic Metabolic Panel: Recent Labs  Lab 02/25/18 0351 02/26/18 0930 02/28/18 1114 03/01/18 0508  NA 144 147* 144 140  K 3.7 3.7 3.4* 3.9  CL 95* 101 107 107  CO2 37* 33* 27 23  GLUCOSE 157* 137* 97 92  BUN 85* 78* 45* 35*  CREATININE 2.00* 1.74* 1.50* 1.48*  CALCIUM 8.3* 8.4* 8.0* 8.0*    Liver Function Tests: Recent Labs  Lab 03/01/18 0508  AST 19  ALT 20  ALKPHOS 46  BILITOT 1.0  PROT 6.1*  ALBUMIN 2.7*   No results for input(s): LIPASE, AMYLASE in the last 168 hours. No results for input(s): AMMONIA in the last 168 hours.  CBC: Recent Labs  Lab 02/25/18 0351 02/26/18 0435 03/01/18 0508  WBC 12.9* 13.5* 7.5  HGB 12.4* 12.4* 12.2*  HCT 38.1* 39.1* 37.0*  MCV 88.9 89.8 89.2  PLT 107* 105* 161    Cardiac Enzymes: No results for input(s): CKTOTAL, CKMB, CKMBINDEX, TROPONINI in the last 168  hours.  BNP: Invalid input(s): POCBNP  CBG: Recent Labs  Lab 03/02/18 1156 03/02/18 1647 03/02/18 2130 03/03/18 0756 03/03/18 1154  GLUCAP 117* 126* 143* 2 121*    Microbiology: Results for orders placed or performed during the hospital encounter of 02/21/18  MRSA PCR Screening     Status: None   Collection Time: 02/21/18  4:23 AM  Result Value Ref Range Status   MRSA by PCR NEGATIVE NEGATIVE Final    Comment:        The GeneXpert MRSA Assay (FDA approved for NASAL specimens only), is one component of a comprehensive MRSA colonization surveillance program. It is not intended to diagnose MRSA infection nor to guide or monitor treatment for MRSA infections. Performed at [facility], [address] [place],  [address]   C difficile quick scan w PCR reflex     Status: None   Collection Time: 03/02/18  8:18 AM  Result Value Ref Range Status   C Diff antigen NEGATIVE NEGATIVE Final   C Diff toxin NEGATIVE NEGATIVE Final   C Diff interpretation No C. difficile detected.  Final    Comment: Performed at [facility], [address] [place],  [address]    Coagulation Studies: No results for input(s): LABPROT, INR in the last  72 hours.  Urinalysis: No results for input(s): COLORURINE, LABSPEC, PHURINE, GLUCOSEU, HGBUR, BILIRUBINUR, KETONESUR, PROTEINUR, UROBILINOGEN, NITRITE, LEUKOCYTESUR in the last 72 hours.  Invalid input(s): APPERANCEUR    Imaging: Dg Chest 2 View  Result Date: 03/02/2018 CLINICAL DATA:  Cough and congestion. EXAM: CHEST - 2 VIEW COMPARISON:  02/28/2018. FINDINGS: Right PICC line noted with tip in stable position at cavoatrial junction. Heart size normal. Bibasilar subsegmental again noted. Similar findings on prior exam. No prominent pleural effusion. No pneumothorax. IMPRESSION: 1. Right PICC line noted with tip at cavoatrial junction in stable position. 2. Low lung volumes with mild bibasilar again noted.  Similar findings noted on prior exam. Electronically Signed   By: La Conner   On: 03/02/2018 15:25     Medications:    . amoxicillin-clavulanate  1 tablet Oral Q12H  . apixaban  5 mg Oral BID  . insulin aspart  0-15 Units Subcutaneous TID WC  . insulin aspart  0-5 Units Subcutaneous QHS  . lactobacillus  1 g Oral TID WC  . [START ON 03/04/2018] multivitamin with minerals  1 tablet Oral Daily  . mycophenolate  1,000 mg Oral BID  . oseltamivir  75 mg Oral BID  . pantoprazole  40 mg Oral Daily  . predniSONE  50 mg Oral Q breakfast  . protein supplement shake  11 oz Oral BID BM  . QUEtiapine  100 mg Oral QHS  . QUEtiapine  50 mg Oral Once  . sertraline  50 mg Oral Daily   guaiFENesin, hydrALAZINE, ipratropium-albuterol, LORazepam, [DISCONTINUED] ondansetron **OR** ondansetron (ZOFRAN) IV, sodium chloride flush, traMADol  Assessment/ Plan:  Jonathon Snow is a 57 y.o. 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 Intubated and sedated.   1. Acute Renal Failure on chronic kidney disease stage III with hematuria and proteinuria.  Acute renal failure has improved Creatinine at baseline Chronic kidney disease stage III with hematuria and proteinuria secondary to lupus nephritis.  Currently off cellcept.   Acute renal failure from ATN from acute illness, prerenal azotemia and hypoxia  Continue mycophenolate   LOS: 10 Jonathon Snow 3/13/20192:34 PM

## 2018-03-03 NOTE — Clinical Social Work Note (Signed)
CSW received referral for SNF.  Case discussed with case manager and plan is to discharge home with home health.  CSW to sign off please re-consult if social work needs arise.  Carley Glendenning R. Marae Cottrell, MSW, LCSWA [phone]  

## 2018-03-03 NOTE — Progress Notes (Signed)
Received order to discharge patient to home, gave patient work note, reviewed home meds discharge instructions and prescriptions with patient and patient verbalized understanding

## 2018-03-04 LAB — MISC LABCORP TEST (SEND OUT): LABCORP TEST CODE: 183480

## 2018-03-04 NOTE — Care Management (Signed)
Nebulizer ordered for home. [place] representative updated. Nebulizer delivered to room 03/03/18 Shelbie Ammons RN MSN CCM Care Management [phone]

## 2018-03-05 ENCOUNTER — Ambulatory Visit (INDEPENDENT_AMBULATORY_CARE_PROVIDER_SITE_OTHER): Payer: 59 | Admitting: Vascular Surgery

## 2018-03-05 ENCOUNTER — Ambulatory Visit: Payer: Self-pay

## 2018-03-05 NOTE — Telephone Encounter (Signed)
Pt has vomited 3-4 times since 1200. Pt is c/o headache, heartburn and intermittent sweating. Pt is now drinking gatorade and water. Care advice given. Advised to call back if worsens. Reason for Disposition . MILD or MODERATE vomiting (e.g., 1 - 5 times / day)  Answer Assessment - Initial Assessment Questions 1. VOMITING SEVERITY: "How many times have you vomited in the past 24 hours?"     - MILD:  1 - 2 times/day    - MODERATE: 3 - 5 times/day, decreased oral intake without significant weight loss or symptoms of dehydration    - SEVERE: 6 or more times/day, vomits everything or nearly everything, with significant weight loss, symptoms of dehydration      Moderate- 3-4 times today 2. ONSET: "When did the vomiting begin?"      1 hour ago (1200) 3. FLUIDS: "What fluids or food have you vomited up today?" "Have you been able to keep any fluids down?"     Oatmeal beverage-yes is sipping 4. ABDOMINAL PAIN: "Are your having any abdominal pain?" If yes : "How bad is it and what does it feel like?" (e.g., crampy, dull, intermittent, constant)      Yes- 5-6 pt cannot describe and hes having heartburn 5. DIARRHEA: "Is there any diarrhea?" If so, ask: "How many times today?"      no 6. CONTACTS: "Is there anyone else in the family with the same symptoms?"      no 7. CAUSE: "What do you think is causing your vomiting?"     Pt does not know 8. HYDRATION STATUS: "Any signs of dehydration?" (e.g., dry mouth [not only dry lips], too weak to stand) "When did you last urinate?"     Pt does not know -urinated a few minutes ago 9. OTHER SYMPTOMS: "Do you have any other symptoms?" (e.g., fever, headache, vertigo, vomiting blood or coffee grounds, recent head injury)     Headache, no dizziness, sweating that comes and goes 10. PREGNANCY: "Is there any chance you are pregnant?" "When was your last menstrual period?"       n/a  Protocols used: [facility]

## 2018-03-08 ENCOUNTER — Telehealth: Payer: Self-pay | Admitting: Pediatrics

## 2018-03-08 NOTE — Telephone Encounter (Signed)
Notified by Colan Neptune CMA that patient had questions about home health PT follow up.  It appears that and order was placed, however services were not arranged at discharge.  RNCM called patient.  Patient did not answer, and voicemail was full.  RNCM will attempt again tomorrow

## 2018-03-09 NOTE — Discharge Summary (Signed)
[place] at [facility], [place] y.o., DOB October 17, 1961, MRN [id number]. Admission date: 02/21/2018 Discharge Date 03/09/2018 Primary MD Arnetha Courser, MD Admitting Physician Saundra Shelling, MD  Admission Diagnosis  Acute respiratory failure with hypoxia ([place]) [J96.01] Drug overdose, undetermined intent, initial encounter [T50.904A] Hypotension, unspecified hypotension type [I95.9] Acute renal failure, unspecified acute renal failure type ([place]) [N17.9] Aspiration pneumonia due to gastric secretions, unspecified laterality, unspecified part of lung ([place]) [J69.0]  Discharge Diagnosis   Principal Problem: Acute encephalopathy due to acute respiratory failure pneumonia Acute aspiration pneumonia  Septic shock Acute renal failure on chronic kidney disease stage III Lupus erythematosus History of DVT Prolonged delirium Generalized weakness      Hospital Course  Patient is a 57 year old white male who was admitted to the hospital with acute encephalopathy.  Requiring intubation.  He was noted to have pneumonia and acute renal failure.  Patient had to be intubated and remained on the vent.  Subsequently extubated.  He was treated with antibiotics for his pneumonia.  Patient also needed CRRT for acute renal failure.  His renal function now is much improved.  Patient doing much better. He has been cleared by nephrology to be discharged home.            Consults  nephrology, pulmonary critical care, psychiatry  Significant Tests:  See full reports for all details    Ct Abdomen Pelvis Wo Contrast  Result Date: 02/28/2018 CLINICAL DATA:  Left lower quadrant pain EXAM: CT ABDOMEN AND PELVIS WITHOUT CONTRAST TECHNIQUE: Multidetector CT imaging of the abdomen and pelvis was performed following the standard protocol without IV contrast. COMPARISON:  11/06/2017 FINDINGS: Lower chest: Small bilateral pleural effusions are noted. Bibasilar atelectatic  changes are again seen. Hepatobiliary: Stable cystic changes are again seen in the posterior aspect of the right lobe of the liver similar to that noted on prior exam. The gallbladder is within normal limits. Pancreas: Unremarkable. No pancreatic ductal dilatation or surrounding inflammatory changes. Spleen: Normal in size without focal abnormality. Adrenals/Urinary Tract: The adrenal glands are within normal limits. The kidneys are well visualized bilaterally without evidence of renal calculi or urinary tract obstructive changes. The bladder is well distended. A tiny focus of air is noted within the nondependent portion of the bladder likely related to prior instrumentation. Stomach/Bowel: Scattered diverticular change of the colon is noted without evidence of diverticulitis. No obstructive or inflammatory changes are seen. The appendix is within normal limits. Vascular/Lymphatic: Aortic calcifications are noted. An IVC filter is noted in stable position. No significant lymphadenopathy is noted. Reproductive: Prostate is unremarkable. Other: No abdominal wall hernia or abnormality. No abdominopelvic ascites. Soft tissue changes are noted in the right inguinal region which may be related to recent vascular access. Musculoskeletal: Degenerative changes of the lumbar spine are seen. Stable compression deformity of L1 is noted. IMPRESSION: Chronic changes as described above without acute abnormality. Soft tissue changes in the right inguinal region likely related to recent vascular access. Electronically Signed   By: Inez Catalina M.D.   On: 02/28/2018 19:41   Dg Chest 2 View  Result Date: 03/02/2018 CLINICAL DATA:  Cough and congestion. EXAM: CHEST - 2 VIEW COMPARISON:  02/28/2018. FINDINGS: Right PICC line noted with tip in stable position at cavoatrial junction. Heart size normal. Bibasilar subsegmental again noted. Similar findings on prior exam. No prominent pleural effusion. No pneumothorax. IMPRESSION: 1.  Right PICC line noted with tip at cavoatrial junction in stable position.  2. Low lung volumes with mild bibasilar again noted. Similar findings noted on prior exam. Electronically Signed   By: [place]   On: 03/02/2018 15:25   Dg Abdomen 1 View  Result Date: 02/21/2018 CLINICAL DATA:  Orogastric tube placement. EXAM: ABDOMEN - 1 VIEW COMPARISON:  CT 11/06/2017 FINDINGS: Tip and side port of the enteric tube below the diaphragm in the stomach. No bowel dilatation to suggest obstruction. IVC filter slightly tilted to the left, unchanged from prior CT. No evidence of free air. IMPRESSION: Tip and side port of the enteric tube below the diaphragm in the stomach. Electronically Signed   By: Jeb Levering M.D.   On: 02/21/2018 04:18   Ct Head Wo Contrast  Result Date: 02/21/2018 CLINICAL DATA:  57 y/o  M; found unconscious. EXAM: CT HEAD WITHOUT CONTRAST TECHNIQUE: Contiguous axial images were obtained from the base of the skull through the vertex without intravenous contrast. COMPARISON:  12/10/2013 CT head. FINDINGS: Brain: No evidence of acute infarction, hemorrhage, hydrocephalus, extra-axial collection or mass lesion/mass effect. Vascular: No hyperdense vessel. Calcific atherosclerosis of carotid siphons. Skull: Normal. Negative for fracture or focal lesion. Sinuses/Orbits: Multiple paranasal sinus fluid levels. Normal aeration of mastoid air cells. Orbits incompletely included within field of view, no traumatic finding identified. Other: None. IMPRESSION: 1. No acute intracranial abnormality or calvarial fracture. 2. Multiple sinus fluid levels may represent acute sinusitis or facial trauma, clinical correlation recommended. Electronically Signed   By: Kristine Garbe M.D.   On: 02/21/2018 04:45   Nm Pulmonary Perf And Vent  Result Date: 03/03/2018 CLINICAL DATA:  PE suspected, intermediate prob, neg D-dimer. Pt has a hx of DVT and PE. Last DVT was 2 yrs ago. Last PE was 2014/2015 time  frame. SOB EXAM: NUCLEAR MEDICINE VENTILATION - PERFUSION LUNG SCAN TECHNIQUE: Ventilation images were obtained in multiple projections using inhaled aerosol Tc-64m DTPA. Perfusion images were obtained in multiple projections after intravenous injection of Tc-60m-MAA. RADIOPHARMACEUTICALS:  32.4 mCi of Tc-37m DTPA aerosol inhalation and 4.0 mCi Tc72m-MAA IV COMPARISON:  None. FINDINGS: Ventilation: No focal ventilation defect. Decreased ventilation to the RIGHT upper lobe apex. Perfusion: No wedge shaped peripheral perfusion defects to suggest acute pulmonary embolism. Decreased profusion to the RIGHT upper lobe matches the ventilation defect. IMPRESSION: No evidence acute pulmonary embolism. Electronically Signed   By: Suzy Bouchard M.D.   On: 03/03/2018 16:23   Dg Chest Port 1 View  Result Date: 02/28/2018 CLINICAL DATA:  Cough.  Pneumonia. EXAM: PORTABLE CHEST 1 VIEW COMPARISON:  02/26/2018 FINDINGS: Right jugular catheter terminates over the lower SVC. The cardiac silhouette is normal in size. There is improved aeration of the lung bases with mild residual opacity bilaterally. Pleural effusions have also decreased or resolved. No pneumothorax is identified. IMPRESSION: Improved aeration of the lung bases with mild residual opacity, likely atelectasis. Electronically Signed   By: Logan Bores M.D.   On: 02/28/2018 11:51   Dg Chest Port 1 View  Result Date: 02/26/2018 CLINICAL DATA:  Shortness of breath. EXAM: PORTABLE CHEST 1 VIEW COMPARISON:  Single-view of the chest 02/21/2018 and PA and lateral chest 10/01/2015. FINDINGS: Endotracheal tube and NG tube present on the most recent examination has been removed. Right subclavian central venous catheter remains in place. Aeration in the right mid and lower lung zones is markedly improved since the most recent exam. There is mild left basilar atelectasis. Heart size is normal. No pneumothorax. IMPRESSION: New small left pleural effusion.  No other new  abnormality. Right effusion and airspace disease have markedly improved. Electronically Signed   By: Inge Rise M.D.   On: 02/26/2018 11:29   Dg Chest Port 1 View  Result Date: 02/21/2018 CLINICAL DATA:  57 year old male status post central line placement. Respiratory failure, intubated. EXAM: PORTABLE CHEST 1 VIEW COMPARISON:  0311 hours today and earlier. FINDINGS: Portable AP semi upright view at 0944 hours. A right IJ central line now is in place. The catheter tip projects at the cavoatrial junction level. An enteric tube has been placed, courses to the abdomen, tip not included. Endotracheal tube tip is stable up the level the clavicles. More kyphotic positioning now. No pneumothorax identified. Veiling opacity in the right lung. Increased retrocardiac opacity right greater than left. Stable left lung aside from left retrocardiac opacity. Pulmonary vascularity appears stable. IMPRESSION: 1. Right IJ central line placed, tip at the cavoatrial junction level. 2. Enteric tube placed, courses to the abdomen tip not included. 3. No pneumothorax, but increasing veiling and confluent opacity in the right lung suggesting right pleural effusion plus new right lower lobe collapse or consolidation. Electronically Signed   By: Genevie Ann M.D.   On: 02/21/2018 10:04   Dg Chest Portable 1 View  Result Date: 02/21/2018 CLINICAL DATA:  Respiratory failure.  Endotracheal tube placement. EXAM: PORTABLE CHEST 1 VIEW COMPARISON:  Most recent chest imaging CT 04/27/2017 FINDINGS: Endotracheal tube tip 3.2 cm from the carina. Low lung volumes. Borderline cardiomegaly. Ill-defined bilateral perihilar density. More diffuse right lung opacities most prominent in the suprahilar lung. Possible small right pleural effusion. No pneumothorax. IMPRESSION: 1. Endotracheal tube tip 3.2 cm from the carina. 2. Ill-defined bilateral perihilar opacities likely pulmonary edema. 3. Patchy opacities throughout the right lung may be  pneumonia, asymmetric pulmonary edema or aspiration. Electronically Signed   By: Jeb Levering M.D.   On: 02/21/2018 03:30       Today   Subjective:   Jahid Weida is doing much better wants to go home  Objective:   Blood pressure (!) 149/78, pulse 93, temperature 98.4 F (36.9 C), temperature source Oral, resp. rate 15, height 6' (1.829 m), weight 225 lb 14.4 oz (102.5 kg), SpO2 95 %.  . No intake or output data in the 24 hours ending 03/09/18 1308  Exam VITAL SIGNS: Blood pressure (!) 149/78, pulse 93, temperature 98.4 F (36.9 C), temperature source Oral, resp. rate 15, height 6' (1.829 m), weight 225 lb 14.4 oz (102.5 kg), SpO2 95 %.  GENERAL:  57 y.o.-year-old patient lying in the bed with no acute distress.  EYES: Pupils equal, round, reactive to light and accommodation. No scleral icterus. Extraocular muscles intact.  HEENT: Head atraumatic, normocephalic. Oropharynx and nasopharynx clear.  NECK:  Supple, no jugular venous distention. No thyroid enlargement, no tenderness.  LUNGS: Normal breath sounds bilaterally, no wheezing, rales,rhonchi or crepitation. No use of accessory muscles of respiration.  CARDIOVASCULAR: S1, S2 normal. No murmurs, rubs, or gallops.  ABDOMEN: Soft, nontender, nondistended. Bowel sounds present. No organomegaly or mass.  EXTREMITIES: No pedal edema, cyanosis, or clubbing.  NEUROLOGIC: Cranial nerves II through XII are intact. Muscle strength 5/5 in all extremities. Sensation intact. Gait not checked.  PSYCHIATRIC: The patient is alert and oriented x 3.  SKIN: No obvious rash, lesion, or ulcer.   Data Review     CBC w Diff:  Lab Results  Component Value Date   WBC 7.5 03/01/2018   HGB 12.2 (L) 03/01/2018   HGB 10.4 (L) 04/10/2015  HCT 37.0 (L) 03/01/2018   HCT 31.4 (L) 04/10/2015   PLT 161 03/01/2018   PLT 179 04/10/2015   LYMPHOPCT 18 02/21/2018   LYMPHOPCT 13.8 03/28/2015   MONOPCT 2 02/21/2018   MONOPCT 12.9 03/28/2015    EOSPCT 1 02/21/2018   EOSPCT 2.1 03/28/2015   BASOPCT 1 02/21/2018   BASOPCT 0.2 03/28/2015   CMP:  Lab Results  Component Value Date   NA 140 03/01/2018   NA 141 04/17/2016   NA 138 04/10/2015   K 3.9 03/01/2018   K 3.5 04/10/2015   CL 107 03/01/2018   CL 108 04/10/2015   CO2 23 03/01/2018   CO2 25 04/10/2015   BUN 35 (H) 03/01/2018   BUN 23 04/17/2016   BUN 16 04/10/2015   CREATININE 1.48 (H) 03/01/2018   CREATININE 2.50 (H) 05/12/2017   PROT 6.1 (L) 03/01/2018   PROT 6.9 04/17/2016   PROT 7.0 04/10/2015   ALBUMIN 2.7 (L) 03/01/2018   ALBUMIN 3.9 04/17/2016   ALBUMIN 3.9 04/10/2015   BILITOT 1.0 03/01/2018   BILITOT 0.4 04/17/2016   BILITOT 0.7 04/10/2015   ALKPHOS 46 03/01/2018   ALKPHOS 71 04/10/2015   AST 19 03/01/2018   AST 19 04/10/2015   ALT 20 03/01/2018   ALT 16 (L) 04/10/2015  .  Micro Results Recent Results (from the past 240 hour(s))  C difficile quick scan w PCR reflex     Status: None   Collection Time: 03/02/18  8:18 AM  Result Value Ref Range Status   C Diff antigen NEGATIVE NEGATIVE Final   C Diff toxin NEGATIVE NEGATIVE Final   C Diff interpretation No C. difficile detected.  Final    Comment: Performed at [facility], [address] [place], [place] [address]     Code Status History    Date Active Date Inactive Code Status Order ID Comments User Context   02/21/2018 05:20 03/03/2018 21:36 Full Code [id number]  Saundra Shelling, MD Inpatient   05/07/2017 15:24 05/09/2017 17:11 Full Code [id number]  Epifanio Lesches, MD ED   04/27/2017 18:47 05/03/2017 20:12 Full Code [id number]  Vaughan Basta, MD Inpatient          Follow-up Information    Arnetha Courser, MD Follow up in 1 week(s).   Specialty:  Family Medicine Why:  hosp f/u Contact information: [address] [place] [address] [phone]           Discharge Medications   Allergies as of 03/03/2018      Reactions   Hydrocodone Rash    Vicodin [hydrocodone-acetaminophen] Hives, Rash   Severe headaches (also)      Medication List    STOP taking these medications   doxycycline 100 MG tablet Commonly known as:  VIBRA-TABS   moxifloxacin 400 MG tablet Commonly known as:  AVELOX   predniSONE 10 MG tablet Commonly known as:  DELTASONE     TAKE these medications   amLODipine 5 MG tablet Commonly known as:  NORVASC Take 2.5 mg by mouth at bedtime.   apixaban 5 MG Tabs tablet Commonly known as:  ELIQUIS Take 1 tablet (5 mg total) by mouth 2 (two) times daily. What changed:    how much to take  how to take this  when to take this  additional instructions   benzonatate 200 MG capsule Commonly known as:  TESSALON Take 1 capsule (200 mg total) by mouth 2 (two) times daily as needed for cough.   diphenhydrAMINE  25 mg capsule Commonly known as:  BENADRYL Take 1 capsule (25 mg total) by mouth every 8 (eight) hours as needed.   ipratropium-albuterol 0.5-2.5 (3) MG/3ML Soln Commonly known as:  DUONEB Take 3 mLs by nebulization every 4 (four) hours as needed.   loperamide 2 MG tablet Commonly known as:  IMODIUM A-D Take 1 tablet (2 mg total) 4 (four) times daily as needed by mouth for diarrhea or loose stools.   losartan 100 MG tablet Commonly known as:  COZAAR Take 100 mg by mouth daily.   mometasone 50 MCG/ACT nasal spray Commonly known as:  NASONEX Place 2 sprays into the nose daily.   multivitamin with minerals tablet Take 1 tablet by mouth daily.   mupirocin ointment 2 % Commonly known as:  BACTROBAN Place 1 application into the nose 2 (two) times daily.   mycophenolate 500 MG tablet Commonly known as:  CELLCEPT Take 1,000 mg by mouth 2 (two) times daily.   omeprazole 20 MG tablet Commonly known as:  PRILOSEC OTC Take 20 mg by mouth daily.   ondansetron 4 MG disintegrating tablet Commonly known as:  ZOFRAN ODT Take 1 tablet (4 mg total) every 8 (eight) hours as needed by mouth for  nausea or vomiting.   pantoprazole 40 MG tablet Commonly known as:  PROTONIX TAKE 1 TABLET BY MOUTH EVERY DAY   PARoxetine 20 MG tablet Commonly known as:  PAXIL Take 1 tablet (20 mg total) by mouth daily.   prednisoLONE 5 MG Tabs tablet Take 10 mg by mouth daily.   promethazine 50 MG tablet Commonly known as:  PHENERGAN Take 0.5 tablets (25 mg total) by mouth every 6 (six) hours as needed for nausea or vomiting.   QUEtiapine 100 MG tablet Commonly known as:  SEROQUEL Take 1 tablet (100 mg total) by mouth at bedtime.   rosuvastatin 10 MG tablet Commonly known as:  CRESTOR Take 1 tablet (10 mg total) by mouth at bedtime.   sertraline 50 MG tablet Commonly known as:  ZOLOFT Take 50 mg by mouth daily.   traMADol 50 MG tablet Commonly known as:  ULTRAM Take 2 tablets (100 mg total) by mouth every 6 (six) hours as needed for moderate pain.     ASK your doctor about these medications   amoxicillin-clavulanate 875-125 MG tablet Commonly known as:  AUGMENTIN Take 1 tablet by mouth 2 (two) times daily for 5 days. Ask about: Should I take this medication?   oseltamivir 75 MG capsule Commonly known as:  TAMIFLU Take 1 capsule (75 mg total) by mouth 2 (two) times daily for 9 doses. Ask about: Should I take this medication?          Total Time in preparing paper work, data evaluation and todays exam - 74 minutes  Dustin Flock M.D on 03/09/2018 at [place]  [phone]
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
## 2018-03-10 ENCOUNTER — Telehealth: Payer: Self-pay

## 2018-03-10 NOTE — Telephone Encounter (Signed)
TOC #1. Called pt to f/u after d/c from [facility] on 03/03/18. Also wanted to confirm their hosp f/u appt w/ Dr. Sanda Klein on 03/16/18 @ 11:20am. Discharge planning includes the following:  - Start Augmentin, Duoneb, Tamiflu and Seroquel - HH PT As part of the TOC f/u call, I am also wanting to discuss/review the above information with the pt to ensure all of the above have been taken care of. LVM requesting returned call.

## 2018-03-15 NOTE — Care Management (Signed)
Post discharge note entry/EMMI 03/15/18: RNCM notified by [facility] liaison that patient had no skilled need when they met with patient for start of care. He has follow up appointment with PCP on 03/16/18. Per PT with [facility]- recommendation for OPPT follow up.

## 2018-03-16 ENCOUNTER — Encounter: Payer: Self-pay | Admitting: Family Medicine

## 2018-03-16 ENCOUNTER — Telehealth: Payer: Self-pay | Admitting: Family Medicine

## 2018-03-16 ENCOUNTER — Other Ambulatory Visit: Payer: Self-pay

## 2018-03-16 ENCOUNTER — Ambulatory Visit: Payer: 59 | Admitting: Family Medicine

## 2018-03-16 VITALS — BP 142/100 | HR 100 | Temp 97.6°F | Ht 74.0 in | Wt 222.7 lb

## 2018-03-16 DIAGNOSIS — M3214 Glomerular disease in systemic lupus erythematosus: Secondary | ICD-10-CM | POA: Diagnosis not present

## 2018-03-16 DIAGNOSIS — R112 Nausea with vomiting, unspecified: Secondary | ICD-10-CM

## 2018-03-16 DIAGNOSIS — R0609 Other forms of dyspnea: Secondary | ICD-10-CM | POA: Diagnosis not present

## 2018-03-16 DIAGNOSIS — N189 Chronic kidney disease, unspecified: Secondary | ICD-10-CM

## 2018-03-16 DIAGNOSIS — R531 Weakness: Secondary | ICD-10-CM | POA: Diagnosis not present

## 2018-03-16 DIAGNOSIS — N179 Acute kidney failure, unspecified: Secondary | ICD-10-CM | POA: Diagnosis not present

## 2018-03-16 DIAGNOSIS — J111 Influenza due to unidentified influenza virus with other respiratory manifestations: Secondary | ICD-10-CM

## 2018-03-16 DIAGNOSIS — R0982 Postnasal drip: Secondary | ICD-10-CM

## 2018-03-16 DIAGNOSIS — K2900 Acute gastritis without bleeding: Secondary | ICD-10-CM | POA: Diagnosis not present

## 2018-03-16 MED ORDER — AMLODIPINE BESYLATE 2.5 MG PO TABS: 2.5000 mg | ORAL_TABLET | Freq: Every day | ORAL | 3 refills | Status: DC

## 2018-03-16 MED ORDER — BLOOD PRESSURE MONITOR DEVI: 0 refills | Status: DC

## 2018-03-16 MED ORDER — ONDANSETRON 4 MG PO TBDP: 4.0000 mg | ORAL_TABLET | Freq: Three times a day (TID) | ORAL | 0 refills | Status: DC | PRN

## 2018-03-16 MED ORDER — PANTOPRAZOLE SODIUM 40 MG PO TBEC: 40.0000 mg | DELAYED_RELEASE_TABLET | Freq: Every day | ORAL | 1 refills | Status: DC

## 2018-03-16 MED ORDER — MOMETASONE FUROATE 50 MCG/ACT NA SUSP: 2.0000 | Freq: Every day | NASAL | 0 refills | Status: DC

## 2018-03-16 NOTE — Telephone Encounter (Signed)
I spoke with radiologist who pulled up scan He agrees NO pulmonary embolism Please let patient know

## 2018-03-16 NOTE — Progress Notes (Signed)
DOBDOB

## 2018-03-16 NOTE — Telephone Encounter (Signed)
Called pt, informed him of the information below. Pt gave verbal understanding. 
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
## 2018-03-17 ENCOUNTER — Telehealth: Payer: Self-pay | Admitting: Family Medicine

## 2018-03-17 ENCOUNTER — Other Ambulatory Visit: Payer: Self-pay

## 2018-03-17 ENCOUNTER — Emergency Department: Payer: 59

## 2018-03-17 ENCOUNTER — Observation Stay
Admission: EM | Admit: 2018-03-17 | Discharge: 2018-03-19 | Disposition: A | Discharge status: Home Health | Payer: 59 | Attending: Internal Medicine | Admitting: Internal Medicine

## 2018-03-17 DIAGNOSIS — E785 Hyperlipidemia, unspecified: Secondary | ICD-10-CM | POA: Insufficient documentation

## 2018-03-17 DIAGNOSIS — N3000 Acute cystitis without hematuria: Secondary | ICD-10-CM | POA: Diagnosis present

## 2018-03-17 DIAGNOSIS — K219 Gastro-esophageal reflux disease without esophagitis: Secondary | ICD-10-CM | POA: Diagnosis not present

## 2018-03-17 DIAGNOSIS — K5730 Diverticulosis of large intestine without perforation or abscess without bleeding: Secondary | ICD-10-CM | POA: Insufficient documentation

## 2018-03-17 DIAGNOSIS — I129 Hypertensive chronic kidney disease with stage 1 through stage 4 chronic kidney disease, or unspecified chronic kidney disease: Secondary | ICD-10-CM | POA: Insufficient documentation

## 2018-03-17 DIAGNOSIS — Z79899 Other long term (current) drug therapy: Secondary | ICD-10-CM | POA: Insufficient documentation

## 2018-03-17 DIAGNOSIS — K922 Gastrointestinal hemorrhage, unspecified: Principal | ICD-10-CM | POA: Insufficient documentation

## 2018-03-17 DIAGNOSIS — J189 Pneumonia, unspecified organism: Secondary | ICD-10-CM

## 2018-03-17 DIAGNOSIS — Z7901 Long term (current) use of anticoagulants: Secondary | ICD-10-CM | POA: Diagnosis not present

## 2018-03-17 DIAGNOSIS — Z86718 Personal history of other venous thrombosis and embolism: Secondary | ICD-10-CM | POA: Insufficient documentation

## 2018-03-17 DIAGNOSIS — M329 Systemic lupus erythematosus, unspecified: Secondary | ICD-10-CM | POA: Diagnosis not present

## 2018-03-17 DIAGNOSIS — Z885 Allergy status to narcotic agent status: Secondary | ICD-10-CM | POA: Insufficient documentation

## 2018-03-17 DIAGNOSIS — F329 Major depressive disorder, single episode, unspecified: Secondary | ICD-10-CM | POA: Diagnosis not present

## 2018-03-17 DIAGNOSIS — Z23 Encounter for immunization: Secondary | ICD-10-CM | POA: Diagnosis not present

## 2018-03-17 DIAGNOSIS — Z86711 Personal history of pulmonary embolism: Secondary | ICD-10-CM | POA: Insufficient documentation

## 2018-03-17 DIAGNOSIS — J90 Pleural effusion, not elsewhere classified: Secondary | ICD-10-CM | POA: Diagnosis not present

## 2018-03-17 DIAGNOSIS — K529 Noninfective gastroenteritis and colitis, unspecified: Secondary | ICD-10-CM | POA: Diagnosis not present

## 2018-03-17 DIAGNOSIS — N183 Chronic kidney disease, stage 3 (moderate): Secondary | ICD-10-CM | POA: Insufficient documentation

## 2018-03-17 LAB — URINALYSIS, COMPLETE (UACMP) WITH MICROSCOPIC
Bilirubin Urine: NEGATIVE
Glucose, UA: NEGATIVE mg/dL
KETONES UR: NEGATIVE mg/dL
LEUKOCYTES UA: NEGATIVE
Nitrite: NEGATIVE
PROTEIN: NEGATIVE mg/dL
Specific Gravity, Urine: 1.013 (ref 1.005–1.030)
pH: 5 (ref 5.0–8.0)

## 2018-03-17 LAB — COMPREHENSIVE METABOLIC PANEL
ALBUMIN: 3.2 g/dL — AB (ref 3.5–5.0)
ALT: 21 U/L (ref 17–63)
AST: 23 U/L (ref 15–41)
Alkaline Phosphatase: 112 U/L (ref 38–126)
Anion gap: 9 (ref 5–15)
BUN: 21 mg/dL — AB (ref 6–20)
CHLORIDE: 106 mmol/L (ref 101–111)
CO2: 23 mmol/L (ref 22–32)
Calcium: 8.6 mg/dL — ABNORMAL LOW (ref 8.9–10.3)
Creatinine, Ser: 1.89 mg/dL — ABNORMAL HIGH (ref 0.61–1.24)
GFR calc Af Amer: 44 mL/min — ABNORMAL LOW (ref >60)
GFR, EST NON AFRICAN AMERICAN: 38 mL/min — AB (ref >60)
Glucose, Bld: 99 mg/dL (ref 65–99)
POTASSIUM: 4.2 mmol/L (ref 3.5–5.1)
Sodium: 138 mmol/L (ref 135–145)
Total Bilirubin: 0.4 mg/dL (ref 0.3–1.2)
Total Protein: 7.6 g/dL (ref 6.5–8.1)

## 2018-03-17 LAB — CBC
HEMATOCRIT: 40.4 % (ref 40.0–52.0)
HEMOGLOBIN: 13.4 g/dL (ref 13.0–18.0)
MCH: 29 pg (ref 26.0–34.0)
MCHC: 33.2 g/dL (ref 32.0–36.0)
MCV: 87.2 fL (ref 80.0–100.0)
Platelets: 214 10*3/uL (ref 150–440)
RBC: 4.64 MIL/uL (ref 4.40–5.90)
RDW: 15.2 % — AB (ref 11.5–14.5)
WBC: 4 10*3/uL (ref 3.8–10.6)

## 2018-03-17 LAB — TYPE AND SCREEN
ABO/RH(D): A POS
Antibody Screen: NEGATIVE

## 2018-03-17 LAB — APTT: aPTT: 31 seconds (ref 24–36)

## 2018-03-17 LAB — PROTIME-INR
INR: 1.08
Prothrombin Time: 13.9 seconds (ref 11.4–15.2)

## 2018-03-17 LAB — INFLUENZA PANEL BY PCR (TYPE A & B)
INFLAPCR: NEGATIVE
INFLBPCR: NEGATIVE

## 2018-03-17 LAB — LIPASE, BLOOD: LIPASE: 85 U/L — AB (ref 11–51)

## 2018-03-17 MED ORDER — ONDANSETRON HCL 4 MG/2ML IJ SOLN: 4.0000 mg | Freq: Four times a day (QID) | INTRAMUSCULAR | Status: DC | PRN

## 2018-03-17 MED ORDER — FLUTICASONE PROPIONATE 50 MCG/ACT NA SUSP
1.0000 | Freq: Every day | NASAL | Status: DC
  Filled 2018-03-17: qty 16

## 2018-03-17 MED ORDER — PNEUMOCOCCAL VAC POLYVALENT 25 MCG/0.5ML IJ INJ
0.5000 mL | INJECTION | INTRAMUSCULAR | Status: AC
  Administered 2018-03-18: 0.5 mL via INTRAMUSCULAR
  Filled 2018-03-17: qty 0.5

## 2018-03-17 MED ORDER — FENTANYL CITRATE (PF) 100 MCG/2ML IJ SOLN
50.0000 ug | Freq: Once | INTRAMUSCULAR | Status: AC
  Administered 2018-03-17: 50 ug via INTRAVENOUS
  Filled 2018-03-17: qty 2

## 2018-03-17 MED ORDER — IPRATROPIUM-ALBUTEROL 0.5-2.5 (3) MG/3ML IN SOLN: 3.0000 mL | RESPIRATORY_TRACT | Status: DC | PRN

## 2018-03-17 MED ORDER — BISACODYL 5 MG PO TBEC: 5.0000 mg | DELAYED_RELEASE_TABLET | Freq: Every day | ORAL | Status: DC | PRN

## 2018-03-17 MED ORDER — VANCOMYCIN HCL 10 G IV SOLR
2000.0000 mg | Freq: Once | INTRAVENOUS | Status: AC
  Administered 2018-03-17: 2000 mg via INTRAVENOUS
  Filled 2018-03-17: qty 2000

## 2018-03-17 MED ORDER — LOSARTAN POTASSIUM 50 MG PO TABS
100.0000 mg | ORAL_TABLET | Freq: Every day | ORAL | Status: DC
  Administered 2018-03-18 – 2018-03-19 (×2): 100 mg via ORAL
  Filled 2018-03-17 (×2): qty 2

## 2018-03-17 MED ORDER — SODIUM CHLORIDE 0.9 % IV BOLUS
1000.0000 mL | Freq: Once | INTRAVENOUS | Status: AC
  Administered 2018-03-17: 1000 mL via INTRAVENOUS

## 2018-03-17 MED ORDER — AMLODIPINE BESYLATE 5 MG PO TABS
2.5000 mg | ORAL_TABLET | Freq: Every day | ORAL | Status: DC
  Administered 2018-03-17 – 2018-03-18 (×2): 2.5 mg via ORAL
  Filled 2018-03-17 (×2): qty 1

## 2018-03-17 MED ORDER — ONDANSETRON HCL 4 MG/2ML IJ SOLN
4.0000 mg | Freq: Once | INTRAMUSCULAR | Status: AC
  Administered 2018-03-17: 4 mg via INTRAVENOUS
  Filled 2018-03-17: qty 2

## 2018-03-17 MED ORDER — QUETIAPINE FUMARATE 25 MG PO TABS
100.0000 mg | ORAL_TABLET | Freq: Every day | ORAL | Status: DC
  Administered 2018-03-17 – 2018-03-18 (×2): 100 mg via ORAL
  Filled 2018-03-17 (×2): qty 4

## 2018-03-17 MED ORDER — ONDANSETRON HCL 4 MG PO TABS: 4.0000 mg | ORAL_TABLET | Freq: Four times a day (QID) | ORAL | Status: DC | PRN

## 2018-03-17 MED ORDER — ONDANSETRON 4 MG PO TBDP: 4.0000 mg | ORAL_TABLET | Freq: Three times a day (TID) | ORAL | Status: DC | PRN

## 2018-03-17 MED ORDER — PANTOPRAZOLE SODIUM 40 MG PO TBEC
40.0000 mg | DELAYED_RELEASE_TABLET | Freq: Every day | ORAL | Status: DC
  Administered 2018-03-18 – 2018-03-19 (×2): 40 mg via ORAL
  Filled 2018-03-17 (×2): qty 1

## 2018-03-17 MED ORDER — INFLUENZA VAC SPLIT QUAD 0.5 ML IM SUSY
0.5000 mL | PREFILLED_SYRINGE | INTRAMUSCULAR | Status: AC
  Administered 2018-03-18: 0.5 mL via INTRAMUSCULAR
  Filled 2018-03-17: qty 0.5

## 2018-03-17 MED ORDER — PIPERACILLIN-TAZOBACTAM 3.375 G IVPB 30 MIN
3.3750 g | Freq: Once | INTRAVENOUS | Status: AC
  Administered 2018-03-17: 3.375 g via INTRAVENOUS
  Filled 2018-03-17: qty 50

## 2018-03-17 MED ORDER — MYCOPHENOLATE MOFETIL 250 MG PO CAPS
1000.0000 mg | ORAL_CAPSULE | Freq: Two times a day (BID) | ORAL | Status: DC
  Administered 2018-03-17 – 2018-03-19 (×4): 1000 mg via ORAL
  Filled 2018-03-17 (×5): qty 4

## 2018-03-17 MED ORDER — ADULT MULTIVITAMIN W/MINERALS CH
1.0000 | ORAL_TABLET | Freq: Every day | ORAL | Status: DC
  Administered 2018-03-18 – 2018-03-19 (×2): 1 via ORAL
  Filled 2018-03-17 (×2): qty 1

## 2018-03-17 MED ORDER — DOCUSATE SODIUM 100 MG PO CAPS
100.0000 mg | ORAL_CAPSULE | Freq: Two times a day (BID) | ORAL | Status: DC
  Administered 2018-03-18: 100 mg via ORAL
  Filled 2018-03-17 (×3): qty 1

## 2018-03-17 MED ORDER — SODIUM CHLORIDE 0.9 % IV SOLN
Freq: Once | INTRAVENOUS | Status: AC
  Administered 2018-03-17: 23:00:00 via INTRAVENOUS

## 2018-03-17 NOTE — Telephone Encounter (Signed)
Please advise. Thanks.  

## 2018-03-17 NOTE — Telephone Encounter (Signed)
Called pt no answer. LM for pt informed of information below. Called daughter Jonelle Sidle) informed her of the need to get her father to the ED. She gave verbal understanding.

## 2018-03-17 NOTE — Telephone Encounter (Signed)
Copied from [place]. Topic: Quick Communication - See Telephone Encounter >> Mar 17, 2018  9:12 AM Percell Belt A wrote: CRM for notification. See Telephone encounter for: 03/17/18. Pt daughter called in and stated that pt was just seen yesterday and want to let the dr know that today he has blood in his stool.  He has been having a headache for 4 days  Best number - [phone]

## 2018-03-17 NOTE — ED Notes (Signed)
Butch RN, aware of bed assigned  

## 2018-03-17 NOTE — ED Notes (Signed)
Patient transported to CT at this time. 

## 2018-03-17 NOTE — ED Triage Notes (Signed)
Pt c/o RLQ pain since yesterday, states today having blood mixed with loose stools . Pt is currently on blood thinners due to hx of DVT/PE/lupus/Stroke. States he has had 3 stools today, no clots noted per pt. Pt is pale in color.
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
## 2018-03-17 NOTE — Telephone Encounter (Signed)
Please thank her very much for calling I'm going to recommend they go right to urgent care or the ER now He could have developed a stress ulcer from his hospitalization I hope everything turns out okay, but he'll need to go get checked out right away

## 2018-03-17 NOTE — H&P (Signed)
[facility] NAME: Jonathon Snow    MR#:  [id number]  DATE OF BIRTH:  1961/06/24  DATE OF ADMISSION:  03/17/2018  PRIMARY CARE PHYSICIAN: Arnetha Courser, MD   REQUESTING/REFERRING PHYSICIAN:   CHIEF COMPLAINT:   Chief Complaint  Patient presents with  . Abdominal Pain    HISTORY OF PRESENT ILLNESS: Jonathon Snow  is a 57 y.o. male with a known history of lupus, chronic kidney disease, DVT and PE on Eliquis.  Patient was recently hospitalized, 3 weeks ago for acute respiratory failure secondary to drug overdose and pneumonia.  She was hospitalized for almost 2 weeks and required IV antibiotics and intubation. He presents to emergency room this time, for acute onset of diarrhea associated with intermittent mild cramps in the right lower quadrant, lasting a few minutes at a time, going on for the past 3 days, gradually getting worse.  In the past 24 hours, patient also noticed minimal amounts of blood in his stool.  No fever or chills, no nausea, no chest pain/SOB, no cough.  He has been complaining of generalized weakness and fatigue still going on, since the discharge from the hospital, from March 13th.  Patient did not notice any factors that would improve or worsen his symptoms.  No sick contacts. Blood test done in the emergency room are notable for WBC at 4000.  Creatinine is 1.89.  Hemoglobin is 13.4.  Chest x-ray, reviewed by myself, shows right lung base atelectasis.  Abdomen and pelvis CAT scan, reviewed by myself, is noted without acute intra-abdominal or pelvic abnormality. Patient is admitted for further evaluation and treatment of the GI bleed.  PAST MEDICAL HISTORY:   Past Medical History:  Diagnosis Date  . Depression   . DVT (deep venous thrombosis) ([place])   . GERD (gastroesophageal reflux disease)   . Hyperlipidemia   . Hypertension   . Lupus   . Pulmonary embolism ([place])   . Renal disorder    Stage III     PAST SURGICAL HISTORY:  Past Surgical History:  Procedure Laterality Date  . ANKLE SURGERY Right   . CYST EXCISION  92 or 93    Liver cyst removal [facility]  . I&D EXTREMITY Right 04/29/2017   Procedure: IRRIGATION AND DEBRIDEMENT EXTREMITY;  Surgeon: Clayburn Pert, MD;  Location: [facility] ORS;  Service: General;  Laterality: Right;  . IRRIGATION AND DEBRIDEMENT ABSCESS Left 04/29/2017   Procedure: IRRIGATION AND DEBRIDEMENT Scrotal ABSCESS;  Surgeon: Clayburn Pert, MD;  Location: [facility] ORS;  Service: General;  Laterality: Left;    SOCIAL HISTORY:  Social History   Tobacco Use  . Smoking status: Never Smoker  . Smokeless tobacco: Never Used  Substance Use Topics  . Alcohol use: No    FAMILY HISTORY:  Family History  Problem Relation Age of Onset  . Hypertension Father   . Heart disease Father   . Clotting disorder Mother   . Kidney disease Brother   . Heart attack Maternal Grandmother   . Heart attack Maternal Grandfather   . Heart attack Paternal Grandfather     DRUG ALLERGIES:  Allergies  Allergen Reactions  . Hydrocodone Rash  . Vicodin [Hydrocodone-Acetaminophen] Hives and Rash    Severe headaches (also)    REVIEW OF SYSTEMS:   CONSTITUTIONAL: No fever, but patient complains of fatigue and generalized weakness.  EYES: No blurred or double vision.  EARS, NOSE, AND THROAT: No tinnitus or ear pain.  RESPIRATORY: No  cough, shortness of breath, wheezing or hemoptysis.  CARDIOVASCULAR: No chest pain, orthopnea, edema.  GASTROINTESTINAL: Positive for diarrhea and right lower quadrant pain.  No nausea, vomiting.  GENITOURINARY: No dysuria, hematuria.  ENDOCRINE: No polyuria, nocturia,  HEMATOLOGY: Positive for bright red blood in stool. SKIN: No rash or lesion. MUSCULOSKELETAL: No joint pain.   NEUROLOGIC: No focal weakness.  PSYCHIATRY: No anxiety or depression.   MEDICATIONS AT HOME:  Prior to Admission medications   Medication Sig Start Date End Date Taking?  Authorizing Provider  apixaban (ELIQUIS) 5 MG TABS tablet Take 1 tablet (5 mg total) by mouth 2 (two) times daily. 03/03/18  Yes Dustin Flock, MD  losartan (COZAAR) 100 MG tablet Take 100 mg by mouth daily. 09/23/15  Yes [provider]  Multiple Vitamins-Minerals (MULTIVITAMIN WITH MINERALS) tablet Take 1 tablet by mouth daily.   Yes [provider]  mycophenolate (CELLCEPT) 500 MG tablet Take 1,000 mg by mouth 2 (two) times daily.   Yes [provider]  QUEtiapine (SEROQUEL) 100 MG tablet Take 1 tablet (100 mg total) by mouth at bedtime. 03/03/18  Yes Dustin Flock, MD  amLODipine (NORVASC) 2.5 MG tablet Take 1 tablet (2.5 mg total) by mouth at bedtime. Patient not taking: Reported on 03/17/2018 03/16/18   Arnetha Courser, MD  Blood Pressure Monitor DEVI I10, check blood pressures daily and as needed; LON 99 months 03/16/18   [name], [name], MD  ipratropium-albuterol (DUONEB) 0.5-2.5 (3) MG/3ML SOLN Take 3 mLs by nebulization every 4 (four) hours as needed. 03/03/18   Dustin Flock, MD  mometasone (NASONEX) 50 MCG/ACT nasal spray Place 2 sprays into the nose daily. 03/16/18   Arnetha Courser, MD  ondansetron (ZOFRAN ODT) 4 MG disintegrating tablet Take 1 tablet (4 mg total) by mouth every 8 (eight) hours as needed for nausea or vomiting. 03/16/18   [name], [name], MD  pantoprazole (PROTONIX) 40 MG tablet Take 1 tablet (40 mg total) by mouth daily. Patient not taking: Reported on 03/17/2018 03/16/18   Arnetha Courser, MD      PHYSICAL EXAMINATION:   VITAL SIGNS: Blood pressure 131/78, pulse 84, temperature 97.7 F (36.5 C), temperature source Oral, resp. rate 18, height 6\' 2"  (1.88 m), weight 101.2 kg (223 lb), SpO2 99 %.  GENERAL:  57 y.o.-year-old patient lying in the bed with no acute distress.  EYES: Pupils equal, round, reactive to light and accommodation. No scleral icterus. Extraocular muscles intact.  HEENT: Head atraumatic, normocephalic. Oropharynx and  nasopharynx clear.  NECK:  Supple, no jugular venous distention. No thyroid enlargement, no tenderness.  LUNGS: Normal breath sounds bilaterally, no wheezing, rales,rhonchi or crepitation. No use of accessory muscles of respiration.  CARDIOVASCULAR: S1, S2 normal. No murmurs, rubs, or gallops.  ABDOMEN: There is mild tenderness with deep palpation in the right lower quadrant, but otherwise soft, nondistended. Bowel sounds present. No organomegaly or mass.  EXTREMITIES: No pedal edema, cyanosis, or clubbing.  NEUROLOGIC: No focal weakness.  PSYCHIATRIC: The patient is alert and oriented x 3.  SKIN: No obvious rash, lesion, or ulcer.   LABORATORY PANEL:   CBC Recent Labs  Lab 03/17/18 1850  WBC 4.0  HGB 13.4  HCT 40.4  PLT 214  MCV 87.2  MCH 29.0  MCHC 33.2  RDW 15.2*   ------------------------------------------------------------------------------------------------------------------  Chemistries  Recent Labs  Lab 03/17/18 1850  NA 138  K 4.2  CL 106  CO2 23  GLUCOSE 99  BUN 21*  CREATININE 1.89*  CALCIUM 8.6*  AST 23  ALT 21  ALKPHOS 112  BILITOT 0.4   ------------------------------------------------------------------------------------------------------------------ estimated creatinine clearance is 55.4 mL/min (A) (by C-G formula based on SCr of 1.89 mg/dL (H)). ------------------------------------------------------------------------------------------------------------------ No results for input(s): TSH, T4TOTAL, T3FREE, THYROIDAB in the last 72 hours.  Invalid input(s): FREET3   Coagulation profile Recent Labs  Lab 03/17/18 1947  INR 1.08   ------------------------------------------------------------------------------------------------------------------- No results for input(s): DDIMER in the last 72 hours. -------------------------------------------------------------------------------------------------------------------  Cardiac Enzymes No results for  input(s): CKMB, TROPONINI, MYOGLOBIN in the last 168 hours.  Invalid input(s): CK ------------------------------------------------------------------------------------------------------------------ Invalid input(s): POCBNP  ---------------------------------------------------------------------------------------------------------------  Urinalysis    Component Value Date/Time   COLORURINE YELLOW (A) 03/17/2018 1850   APPEARANCEUR HAZY (A) 03/17/2018 1850   APPEARANCEUR Clear 05/21/2017 1046   LABSPEC 1.013 03/17/2018 1850   LABSPEC 1.013 04/10/2015 1036   PHURINE 5.0 03/17/2018 1850   GLUCOSEU NEGATIVE 03/17/2018 1850   GLUCOSEU Negative 04/10/2015 1036   HGBUR LARGE (A) 03/17/2018 1850   BILIRUBINUR NEGATIVE 03/17/2018 1850   BILIRUBINUR Negative 05/21/2017 1046   BILIRUBINUR Negative 04/10/2015 1036   KETONESUR NEGATIVE 03/17/2018 1850   PROTEINUR NEGATIVE 03/17/2018 1850   UROBILINOGEN 0.2 03/17/2015 0025   NITRITE NEGATIVE 03/17/2018 1850   LEUKOCYTESUR NEGATIVE 03/17/2018 1850   LEUKOCYTESUR Trace (A) 05/21/2017 1046   LEUKOCYTESUR Negative 04/10/2015 1036     RADIOLOGY: Ct Abdomen Pelvis Wo Contrast  Result Date: 03/17/2018 CLINICAL DATA:  Right lower quadrant pain EXAM: CT ABDOMEN AND PELVIS WITHOUT CONTRAST TECHNIQUE: Multidetector CT imaging of the abdomen and pelvis was performed following the standard protocol without IV contrast. COMPARISON:  CT 02/28/2018, 11/06/2017, 10/18/2017, radiograph 02/21/2018 FINDINGS: Lower chest: Stable scarring and subpleural nodularity at the right lateral lung base. Normal heart size. Hepatobiliary: No calcified gallstone. Stable septated large cyst in the posterior right hepatic lobe with scattered calcifications. Possible layering stones or sludge in the gallbladder. Pancreas: Unremarkable. No pancreatic ductal dilatation or surrounding inflammatory changes. Spleen: Normal in size without focal abnormality. Adrenals/Urinary Tract: Adrenal  glands are unremarkable. Kidneys are normal, without renal calculi, focal lesion, or hydronephrosis. Bladder is unremarkable. Stomach/Bowel: Stomach is within normal limits. Appendix contains calcification but no inflammatory change. Mild sigmoid colon diverticular disease without acute inflammation. No evidence of bowel wall thickening, distention, or inflammatory changes. Vascular/Lymphatic: Nonaneurysmal aorta. Similar appearance and orientation of IVC filter as compared with multiple prior studies. No significantly enlarged lymph nodes. Reproductive: Prostate is unremarkable. Other: Negative for free air or free fluid. Small fat in the umbilical region Musculoskeletal: Stable chronic compression at L1. IMPRESSION: 1. No CT evidence for acute intra-abdominal or pelvic abnormality. Negative for appendicitis. 2. Possible punctate stones and small amount of layering sludge in the gallbladder. 3. Sigmoid colon diverticular disease without acute inflammation 4. Stable right posterior hepatic lobe septated cyst with calcification. Electronically Signed   By: Donavan Foil M.D.   On: 03/17/2018 20:04   Dg Chest 2 View  Result Date: 03/17/2018 CLINICAL DATA:  RIGHT lower quadrant pain. History of pulmonary embolism and lupus. EXAM: CHEST - 2 VIEW COMPARISON:  Chest radiograph March 02, 2018 and priors FINDINGS: Cardiomediastinal silhouette is normal. Stable patchy RIGHT lower lobe airspace opacity associated with pleural thickening and diaphragmatic tenting. Trachea projects midline and there is no pneumothorax. Soft tissue planes and included osseous structures are non-suspicious. Interval removal of RIGHT central venous catheter. IMPRESSION: Similar RIGHT lung base atelectasis/pneumonia. Electronically Signed   By: Elon Alas M.D.   On: 03/17/2018 20:05  EKG: Orders placed or performed during the hospital encounter of 03/17/18  . ED EKG  . ED EKG  . EKG 12-Lead  . EKG 12-Lead    IMPRESSION AND  PLAN:  1.  GI bleed.  Will hold blood thinners for now continue to monitor hemoglobin level.  We will continue PPI treatment and consult GI for further evaluation and treatment. 2.  Acute colitis.  We will check stool studies including stool for C. Difficile.  Continue supportive measures including IV hydration. 3.  CKD 3.  Creatinine is at baseline, 1.89.  We will continue to monitor kidney function closely and avoid nephrotoxic medications.  4.  Hypertension, stable, will restart home medications. 5.  History of DVT/PE.  Will hold Eliquis for now until GI workup done.  Continue to monitor clinically closely.  All the records are reviewed and case discussed with ED provider. Management plans discussed with the patient, family and they are in agreement.  CODE STATUS: Code Status History    Date Active Date Inactive Code Status Order ID Comments User Context   02/21/2018 0520 03/03/2018 2136 Full Code [id number]  Saundra Shelling, MD Inpatient   05/07/2017 1524 05/09/2017 1711 Full Code [id number]  Epifanio Lesches, MD ED   04/27/2017 1847 05/03/2017 2012 Full Code [id number]  Vaughan Basta, MD Inpatient       TOTAL TIME TAKING CARE OF THIS PATIENT: 45  minutes.    Amelia Jo M.D on 03/17/2018 at 9:55 PM  Between 7am to 6pm - Pager - [phone]  After 6pm go to [email or web address] - password [email or web address] [facility]  Office  [phone]  CC: Primary care physician; Arnetha Courser, MD

## 2018-03-18 DIAGNOSIS — N183 Chronic kidney disease, stage 3 (moderate): Secondary | ICD-10-CM | POA: Diagnosis not present

## 2018-03-18 DIAGNOSIS — K529 Noninfective gastroenteritis and colitis, unspecified: Secondary | ICD-10-CM | POA: Diagnosis not present

## 2018-03-18 DIAGNOSIS — K922 Gastrointestinal hemorrhage, unspecified: Secondary | ICD-10-CM | POA: Diagnosis not present

## 2018-03-18 DIAGNOSIS — K625 Hemorrhage of anus and rectum: Secondary | ICD-10-CM | POA: Diagnosis not present

## 2018-03-18 DIAGNOSIS — A09 Infectious gastroenteritis and colitis, unspecified: Secondary | ICD-10-CM | POA: Diagnosis not present

## 2018-03-18 LAB — CBC
HCT: 34.9 % — ABNORMAL LOW (ref 40.0–52.0)
Hemoglobin: 11.8 g/dL — ABNORMAL LOW (ref 13.0–18.0)
MCH: 29.2 pg (ref 26.0–34.0)
MCHC: 33.9 g/dL (ref 32.0–36.0)
MCV: 86.1 fL (ref 80.0–100.0)
PLATELETS: 170 10*3/uL (ref 150–440)
RBC: 4.05 MIL/uL — ABNORMAL LOW (ref 4.40–5.90)
RDW: 14.8 % — AB (ref 11.5–14.5)
WBC: 3.5 10*3/uL — AB (ref 3.8–10.6)

## 2018-03-18 LAB — GLUCOSE, CAPILLARY: Glucose-Capillary: 76 mg/dL (ref 65–99)

## 2018-03-18 LAB — BASIC METABOLIC PANEL
ANION GAP: 9 (ref 5–15)
BUN: 22 mg/dL — ABNORMAL HIGH (ref 6–20)
CO2: 22 mmol/L (ref 22–32)
Calcium: 8 mg/dL — ABNORMAL LOW (ref 8.9–10.3)
Chloride: 109 mmol/L (ref 101–111)
Creatinine, Ser: 1.68 mg/dL — ABNORMAL HIGH (ref 0.61–1.24)
GFR calc Af Amer: 51 mL/min — ABNORMAL LOW (ref >60)
GFR, EST NON AFRICAN AMERICAN: 44 mL/min — AB (ref >60)
Glucose, Bld: 107 mg/dL — ABNORMAL HIGH (ref 65–99)
POTASSIUM: 3.6 mmol/L (ref 3.5–5.1)
SODIUM: 140 mmol/L (ref 135–145)

## 2018-03-18 MED ORDER — DIPHENHYDRAMINE HCL 25 MG PO CAPS
25.0000 mg | ORAL_CAPSULE | Freq: Four times a day (QID) | ORAL | Status: DC | PRN
  Administered 2018-03-18 – 2018-03-19 (×2): 25 mg via ORAL
  Filled 2018-03-18 (×3): qty 1

## 2018-03-18 MED ORDER — PREMIER PROTEIN SHAKE
11.0000 [oz_av] | Freq: Two times a day (BID) | ORAL | Status: DC
  Administered 2018-03-18 – 2018-03-19 (×2): 11 [oz_av] via ORAL

## 2018-03-18 NOTE — Care Management (Signed)
Patient with recent discharge 3/13.  had referral for home health PT to [facility].  Reaching out to agency to determine if ever able to establish start of care.  Patient had addressed concern in EMMI call that had not heard from physical therapy but never returned CM call to discuss.  Admitted with gib.  Patient's anticoagulants on hold. Hemoglobin currently stable.  GI consult

## 2018-03-18 NOTE — Consult Note (Signed)
Vonda Antigua, MD [address] [place], [place], [place], [address] [address] [place], [place], [place], [address] Phone: [phone]  Fax: [phone]  Consultation  Referring Provider:     Dr. Posey Pronto Primary Care Physician:  Arnetha Courser, MD Primary Gastroenterologist: [facility]     Reason for Consultation: Abdominal pain  Date of Admission:  03/17/2018 Date of Consultation:  03/18/2018         HPI:   Jonathon Snow is a 57 y.o. male presents with acute onset diarrhea since 4-5 days, 3-4 loose bowel movements a day, with b/l lower quadrant abdominal cramps, 5/10, intermittent, lasting a few minutes, for the past 3 days, with no radiation, and no nausea or vomiting.  Loose stools are intermittently associated with bright red blood. Had 3 BMs yesterday with blood streaks in the stool.  No blood clots.  This made him come to the ER.  No previous history of hematochezia.  No NSAID use.  Is on Eliquis that he took yesterday morning.  No hematemesis.  No melena.  Patient has not had a bowel movement today, and last bowel movement was yesterday evening.  CT abdomen pelvis, this admission shows no acute intra-abdominal or pelvic abnormality.  No appendicitis.  Sigmoid colon diverticulosis without inflammation.  Stable septated large cyst in the posterior right hepatic lobe.  Pancreas unremarkable.  No calcified gallstones.  Possible layering stones or sludge in the gallbladder.  Chest x-ray reports similar right lung base atelectasis/pneumonia  Hemoglobin is 11.8 on this admission which is around his baseline of 12.2-12.42 weeks ago.  MCV is 86.  Patient was recently discharged on March 13th 2019, and was hospitalized for respiratory failure secondary to drug overdose and pneumonia.  He required intubation at that time.  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  Also has history of lupus and chronic kidney disease.  The abdominal pain issue seems chronic,  as it is mentioned in his GI notes under care everywhere, and Dr. Percell Boston note in epic.  As per [facility] clinic GI notes, patient had an EGD in March 2015 for abdominal pain, which showed mild gastritis and duodenitis.  Colonoscopy 2010 reported internal hemorrhoids, tortuous colon.  Past Medical History:  Diagnosis Date  . Depression   . DVT (deep venous thrombosis) ([place])   . GERD (gastroesophageal reflux disease)   . Hyperlipidemia   . Hypertension   . Lupus   . Pulmonary embolism ([place])   . Renal disorder    Stage III    Past Surgical History:  Procedure Laterality Date  . ANKLE SURGERY Right   . CYST EXCISION  92 or 93    Liver cyst removal [facility]  . I&D EXTREMITY Right 04/29/2017   Procedure: IRRIGATION AND DEBRIDEMENT EXTREMITY;  Surgeon: Clayburn Pert, MD;  Location: [facility] ORS;  Service: General;  Laterality: Right;  . IRRIGATION AND DEBRIDEMENT ABSCESS Left 04/29/2017   Procedure: IRRIGATION AND DEBRIDEMENT Scrotal ABSCESS;  Surgeon: Clayburn Pert, MD;  Location: [facility] ORS;  Service: General;  Laterality: Left;    Prior to Admission medications   Medication Sig Start Date End Date Taking? Authorizing Provider  apixaban (ELIQUIS) 5 MG TABS tablet Take 1 tablet (5 mg total) by mouth 2 (two) times daily. 03/03/18  Yes Dustin Flock, MD  losartan (COZAAR) 100 MG tablet Take 100 mg by mouth daily. 09/23/15  Yes [provider]  Multiple Vitamins-Minerals (MULTIVITAMIN WITH MINERALS) tablet Take 1  tablet by mouth daily.   Yes [provider]  mycophenolate (CELLCEPT) 500 MG tablet Take 1,000 mg by mouth 2 (two) times daily.   Yes [provider]  QUEtiapine (SEROQUEL) 100 MG tablet Take 1 tablet (100 mg total) by mouth at bedtime. 03/03/18  Yes Dustin Flock, MD  amLODipine (NORVASC) 2.5 MG tablet Take 1 tablet (2.5 mg total) by mouth at bedtime. Patient not taking: Reported on 03/17/2018 03/16/18   Arnetha Courser, MD  Blood Pressure Monitor DEVI I10,  check blood pressures daily and as needed; LON 99 months 03/16/18   [name], [name], MD  ipratropium-albuterol (DUONEB) 0.5-2.5 (3) MG/3ML SOLN Take 3 mLs by nebulization every 4 (four) hours as needed. 03/03/18   Dustin Flock, MD  mometasone (NASONEX) 50 MCG/ACT nasal spray Place 2 sprays into the nose daily. 03/16/18   Arnetha Courser, MD  ondansetron (ZOFRAN ODT) 4 MG disintegrating tablet Take 1 tablet (4 mg total) by mouth every 8 (eight) hours as needed for nausea or vomiting. 03/16/18   [name], [name], MD  pantoprazole (PROTONIX) 40 MG tablet Take 1 tablet (40 mg total) by mouth daily. Patient not taking: Reported on 03/17/2018 03/16/18   Arnetha Courser, MD    Family History  Problem Relation Age of Onset  . Hypertension Father   . Heart disease Father   . Clotting disorder Mother   . Kidney disease Brother   . Heart attack Maternal Grandmother   . Heart attack Maternal Grandfather   . Heart attack Paternal Grandfather      Social History   Tobacco Use  . Smoking status: Never Smoker  . Smokeless tobacco: Never Used  Substance Use Topics  . Alcohol use: No  . Drug use: No    Allergies as of 03/17/2018 - Review Complete 03/17/2018  Allergen Reaction Noted  . Hydrocodone Rash 03/17/2015  . Vicodin [hydrocodone-acetaminophen] Hives and Rash 12/09/2013    Review of Systems:    All systems reviewed and negative except where noted in HPI.   Physical Exam:  Vital signs in last 24 hours: Vitals:   03/17/18 2210 03/17/18 2252 03/18/18 0540 03/18/18 0959  BP: 135/83 (!) 157/84 125/77 (!) 142/87  Pulse: 83 77 79   Resp: 17 20 20    Temp:  98.3 F (36.8 C) 98.1 F (36.7 C)   TempSrc:  Oral Oral   SpO2: 100% 100% 99%   Weight:   227 lb 4.7 oz (103.1 kg)   Height:   6\' 2"  (1.88 m)    Last BM Date: 03/17/18 General:   Pleasant, cooperative in NAD Head:  Normocephalic and atraumatic. Eyes:   No icterus.   Conjunctiva pink. PERRLA. Ears:  Normal auditory acuity. Neck:   Supple; no masses or thyroidomegaly Lungs: Respirations even and unlabored. Lungs clear to auscultation bilaterally.   No wheezes, crackles, or rhonchi.  Abdomen:  Soft, nondistended, nontender. Normal bowel sounds. No appreciable masses or hepatomegaly.  No rebound or guarding.  Neurologic:  Alert and oriented x3;  grossly normal neurologically. Skin:  Intact without significant lesions or rashes. Cervical Nodes:  No significant cervical adenopathy. Psych:  Alert and cooperative. Normal affect.  LAB RESULTS: Recent Labs    03/17/18 1850 03/18/18 0412  WBC 4.0 3.5*  HGB 13.4 11.8*  HCT 40.4 34.9*  PLT 214 170   BMET Recent Labs    03/17/18 1850 03/18/18 0412  NA 138 140  K 4.2 3.6  CL 106 109  CO2  23 22  GLUCOSE 99 107*  BUN 21* 22*  CREATININE 1.89* 1.68*  CALCIUM 8.6* 8.0*   LFT Recent Labs    03/17/18 1850  PROT 7.6  ALBUMIN 3.2*  AST 23  ALT 21  ALKPHOS 112  BILITOT 0.4   PT/INR Recent Labs    03/17/18 1947  LABPROT 13.9  INR 1.08    STUDIES: Ct Abdomen Pelvis Wo Contrast  Result Date: 03/17/2018 CLINICAL DATA:  Right lower quadrant pain EXAM: CT ABDOMEN AND PELVIS WITHOUT CONTRAST TECHNIQUE: Multidetector CT imaging of the abdomen and pelvis was performed following the standard protocol without IV contrast. COMPARISON:  CT 02/28/2018, 11/06/2017, 10/18/2017, radiograph 02/21/2018 FINDINGS: Lower chest: Stable scarring and subpleural nodularity at the right lateral lung base. Normal heart size. Hepatobiliary: No calcified gallstone. Stable septated large cyst in the posterior right hepatic lobe with scattered calcifications. Possible layering stones or sludge in the gallbladder. Pancreas: Unremarkable. No pancreatic ductal dilatation or surrounding inflammatory changes. Spleen: Normal in size without focal abnormality. Adrenals/Urinary Tract: Adrenal glands are unremarkable. Kidneys are normal, without renal calculi, focal lesion, or hydronephrosis. Bladder  is unremarkable. Stomach/Bowel: Stomach is within normal limits. Appendix contains calcification but no inflammatory change. Mild sigmoid colon diverticular disease without acute inflammation. No evidence of bowel wall thickening, distention, or inflammatory changes. Vascular/Lymphatic: Nonaneurysmal aorta. Similar appearance and orientation of IVC filter as compared with multiple prior studies. No significantly enlarged lymph nodes. Reproductive: Prostate is unremarkable. Other: Negative for free air or free fluid. Small fat in the umbilical region Musculoskeletal: Stable chronic compression at L1. IMPRESSION: 1. No CT evidence for acute intra-abdominal or pelvic abnormality. Negative for appendicitis. 2. Possible punctate stones and small amount of layering sludge in the gallbladder. 3. Sigmoid colon diverticular disease without acute inflammation 4. Stable right posterior hepatic lobe septated cyst with calcification. Electronically Signed   By: Donavan Foil M.D.   On: 03/17/2018 20:04   Dg Chest 2 View  Result Date: 03/17/2018 CLINICAL DATA:  RIGHT lower quadrant pain. History of pulmonary embolism and lupus. EXAM: CHEST - 2 VIEW COMPARISON:  Chest radiograph March 02, 2018 and priors FINDINGS: Cardiomediastinal silhouette is normal. Stable patchy RIGHT lower lobe airspace opacity associated with pleural thickening and diaphragmatic tenting. Trachea projects midline and there is no pneumothorax. Soft tissue planes and included osseous structures are non-suspicious. Interval removal of RIGHT central venous catheter. IMPRESSION: Similar RIGHT lung base atelectasis/pneumonia. Electronically Signed   By: Elon Alas M.D.   On: 03/17/2018 20:05      Impression / Plan:   NYZAIAH KAI is a 57 y.o. y/o male with history of chronic abdominal pain, as per [facility] clinic GI notes, drug abuse, with recent hospitalization and intubation for drug overdose and pneumonia admitted with diarrhea and abdominal  pain, with hemoglobin at baseline  Acute diarrhea possibly related to recently use antibiotics, versus infectious diarrhea However, patient's diarrhea has resolved, and he has not had any bowel movements today. Would rule out infectious causes including C. difficile.  Collect stool if he has another bowel movement.  Intermittent bright blood blood per rectum is likely due to his known history of hemorrhoids, given that it has resolved, occurred after multiple episodes of diarrhea, and hemoglobin is stable around baseline.  Patient is on Eliquis at home, which based on his creatinine clearance and endoscopic guidelines, will need to be held for 3-4 days prior to any endoscopic procedure. Patient does not have any signs of active GI bleeding at this  time, he is hemodynamically stable, no indication for urgent endoscopy at this time  Can plan for upper and lower endoscopy in 3-4 days after holding Eliquis.  Last dose was yesterday morning 03/17/2018.    Will also need to discuss the patient with anesthesia, as his chest x-ray shows right lower lobe pneumonia on this admission as well.  However he is not on any supplemental oxygen.  Based on scheduling availability and anesthesia evaluation, procedure can be done on Saturday or Sunday  CT also shows a stable chronic hepatic cyst, which can be followed with further workup by his primary GI as an outpatient  Continue serial CBCs and transfuse PRN   Thank you for involving me in the care of this patient.      LOS: 1 day   Virgel Manifold, MD  03/18/2018, 10:08 AM

## 2018-03-18 NOTE — Progress Notes (Signed)
Initial Nutrition Assessment  DOCUMENTATION CODES:   Not applicable  INTERVENTION:   Pt may benefit from daily probiotics   Premier Protein BID, each supplement provides 160 kcal and 30 grams of protein.   MVI daily  Liberalize diet as a heart healthy diet restricts protein also  NUTRITION DIAGNOSIS:   Unintentional weight loss related to acute illness as evidenced by 5 percent weight loss in 5 weeks.  GOAL:   Patient will meet greater than or equal to 90% of their needs  MONITOR:   PO intake, Supplement acceptance, Labs, Weight trends, I & O's  REASON FOR ASSESSMENT:   Malnutrition Screening Tool    ASSESSMENT:   57 y.o. male with a recent hospitalization for drug overdose, aspiration pneumonia, on Eliquis for DVT and PE, presenting with several weeks of multiple episodes daily diarrhea, now with blood streaks in the stool.    Pt familiar to nutrition department from previous admit 3 weeks ago. Pt eating 50-100% of meals at last discharge. Per chart, pt has lost 11lbs(5%) over the past 5 weeks likely related to acute illness and recent hospitalization requiring intubation and ventilation. RD will order Premier Protein as pt was drinking these on last admit. Pt seen by GI today. Per MD note, "Can plan for upper and lower endoscopy in 3-4 days after holding Eliquis." Pt reports no BM today. Pt with h/o hemorrhoids. Pt may benefit from daily probiotics.    Medications reviewed and include: colace, MVI, protonix, cellcept   Labs reviewed: BUN 22(H), creat 1.68(H), Ca 8.0(L) Wbc 3.5(L)  Nutrition-Focused physical exam completed. Findings are no fat depletion, no muscle depletion, and no edema.   Diet Order:  Diet regular Room service appropriate? Yes; Fluid consistency: Thin  EDUCATION NEEDS:   No education needs have been identified at this time  Skin: Reviewed RN Assessment(ecchymosis )  Last BM:  3/27  Height:   Ht Readings from Last 1 Encounters:  03/18/18  6\' 2"  (1.88 m)    Weight:   Wt Readings from Last 1 Encounters:  03/18/18 227 lb 4.7 oz (103.1 kg)    Ideal Body Weight:  86.4 kg  BMI:  Body mass index is 29.18 kg/m.  Estimated Nutritional Needs:   Kcal:  2100-2400kcal/day   Protein:  103-114g/day   Fluid:  >2.1L/day   Koleen Distance MS, RD, LDN Pager #[phone] After Hours Pager: [phone]

## 2018-03-18 NOTE — Progress Notes (Signed)
Per Dr. Posey Pronto okay to administer pneumonia and flu vaccine even though pt has had previous history of both at prior admission.

## 2018-03-18 NOTE — Progress Notes (Signed)
[place] at [facility]                                                                                                                                                                                  Patient Demographics   Jonathon Snow, is a 57 y.o. male, DOB - 1961/12/05, EAV:[id number]  Admit date - 03/17/2018   Admitting Physician Amelia Jo, MD  Outpatient Primary MD for the patient is [name], [name], MD   LOS - 1  Subjective: He is admitted with a GI bleed no further bleeding no further diarrhea Patient has a history of hemorrhoids   Review of Systems:   CONSTITUTIONAL: No documented fever. No fatigue, weakness. No weight gain, no weight loss.  EYES: No blurry or double vision.  ENT: No tinnitus. No postnasal drip. No redness of the oropharynx.  RESPIRATORY: No cough, no wheeze, no hemoptysis. No dyspnea.  CARDIOVASCULAR: No chest pain. No orthopnea. No palpitations. No syncope.  GASTROINTESTINAL: No nausea, no vomiting or diarrhea. No abdominal pain. No melena or hematochezia.  GENITOURINARY: No dysuria or hematuria.  ENDOCRINE: No polyuria or nocturia. No heat or cold intolerance.  HEMATOLOGY: No anemia. No bruising. No bleeding.  INTEGUMENTARY: No rashes. No lesions.  MUSCULOSKELETAL: No arthritis. No swelling. No gout.  NEUROLOGIC: No numbness, tingling, or ataxia. No seizure-type activity.  PSYCHIATRIC: No anxiety. No insomnia. No ADD.    Vitals:   Vitals:   03/17/18 2252 03/18/18 0540 03/18/18 0959 03/18/18 1338  BP: (!) 157/84 125/77 (!) 142/87 (!) 147/81  Pulse: 77 79  88  Resp: 20 20    Temp: 98.3 F (36.8 C) 98.1 F (36.7 C)  98 F (36.7 C)  TempSrc: Oral Oral  Oral  SpO2: 100% 99%  100%  Weight:  227 lb 4.7 oz (103.1 kg)    Height:  6\' 2"  (1.88 m)      Wt Readings from Last 3 Encounters:  03/18/18 227 lb 4.7 oz (103.1 kg)  03/16/18 222 lb 11.2 oz (101 kg)  02/09/18 238 lb (108 kg)     Intake/Output Summary (Last  24 hours) at 03/18/2018 1421 Last data filed at 03/18/2018 1343 Gross per 24 hour  Intake 1050 ml  Output 0 ml  Net 1050 ml    Physical Exam:   GENERAL: Pleasant-appearing in no apparent distress.  HEAD, EYES, EARS, NOSE AND THROAT: Atraumatic, normocephalic. Extraocular muscles are intact. Pupils equal and reactive to light. Sclerae anicteric. No conjunctival injection. No oro-pharyngeal erythema.  NECK: Supple. There is no jugular venous distention. No bruits, no lymphadenopathy, no thyromegaly.  HEART: Regular rate and rhythm,. No murmurs, no rubs, no clicks.  LUNGS: Clear to auscultation bilaterally. No rales or rhonchi. No wheezes.  ABDOMEN: Soft, flat, nontender, nondistended. Has good bowel sounds. No hepatosplenomegaly appreciated.  EXTREMITIES: No evidence of any cyanosis, clubbing, or peripheral edema.  +2 pedal and radial pulses bilaterally.  NEUROLOGIC: The patient is alert, awake, and oriented x3 with no focal motor or sensory deficits appreciated bilaterally.  SKIN: Moist and warm with no rashes appreciated.  Psych: Not anxious, depressed LN: No inguinal LN enlargement    Antibiotics   Anti-infectives (From admission, onward)   Start     Dose/Rate Route Frequency Ordered Stop   03/17/18 2045  vancomycin (VANCOCIN) 2,000 mg in sodium chloride 0.9 % 500 mL IVPB     2,000 mg 250 mL/hr over 120 Minutes Intravenous  Once 03/17/18 2033 03/17/18 2322   03/17/18 2045  piperacillin-tazobactam (ZOSYN) IVPB 3.375 g     3.375 g 100 mL/hr over 30 Minutes Intravenous  Once 03/17/18 2033 03/17/18 2210      Medications   Scheduled Meds: . amLODipine  2.5 mg Oral QHS  . docusate sodium  100 mg Oral BID  . fluticasone  1 spray Each Nare Daily  . Influenza vac split quadrivalent PF  0.5 mL Intramuscular Tomorrow-1000  . losartan  100 mg Oral Daily  . multivitamin with minerals  1 tablet Oral Daily  . mycophenolate  1,000 mg Oral BID  . pantoprazole  40 mg Oral Daily  .  pneumococcal 23 valent vaccine  0.5 mL Intramuscular Tomorrow-1000  . protein supplement shake  11 oz Oral BID BM  . QUEtiapine  100 mg Oral QHS   Continuous Infusions: PRN Meds:.bisacodyl, diphenhydrAMINE, ipratropium-albuterol, ondansetron **OR** ondansetron (ZOFRAN) IV   Data Review:   Micro Results Recent Results (from the past 240 hour(s))  Blood culture (routine x 2)     Status: None (Preliminary result)   Collection Time: 03/17/18  9:07 PM  Result Value Ref Range Status   Specimen Description BLOOD LEFT ANTECUBITAL  Final   Special Requests   Final    BOTTLES DRAWN AEROBIC AND ANAEROBIC Blood Culture results may not be optimal due to an excessive volume of blood received in culture bottles   Culture   Final    NO GROWTH < 12 HOURS Performed at [facility], [address], [place], [place] [address]    Report Status PENDING  Incomplete  Blood culture (routine x 2)     Status: None (Preliminary result)   Collection Time: 03/17/18  9:07 PM  Result Value Ref Range Status   Specimen Description BLOOD RIGHT ANTECUBITAL  Final   Special Requests   Final    BOTTLES DRAWN AEROBIC AND ANAEROBIC Blood Culture results may not be optimal due to an excessive volume of blood received in culture bottles   Culture   Final    NO GROWTH < 12 HOURS Performed at [facility], [address] [place], [place] [address]    Report Status PENDING  Incomplete    Radiology Reports Ct Abdomen Pelvis Wo Contrast  Result Date: 03/17/2018 CLINICAL DATA:  Right lower quadrant pain EXAM: CT ABDOMEN AND PELVIS WITHOUT CONTRAST TECHNIQUE: Multidetector CT imaging of the abdomen and pelvis was performed following the standard protocol without IV contrast. COMPARISON:  CT 02/28/2018, 11/06/2017, 10/18/2017, radiograph 02/21/2018 FINDINGS: Lower chest: Stable scarring and subpleural nodularity at the right lateral lung base. Normal heart size. Hepatobiliary: No calcified gallstone.  Stable septated large cyst in the posterior right hepatic lobe with  scattered calcifications. Possible layering stones or sludge in the gallbladder. Pancreas: Unremarkable. No pancreatic ductal dilatation or surrounding inflammatory changes. Spleen: Normal in size without focal abnormality. Adrenals/Urinary Tract: Adrenal glands are unremarkable. Kidneys are normal, without renal calculi, focal lesion, or hydronephrosis. Bladder is unremarkable. Stomach/Bowel: Stomach is within normal limits. Appendix contains calcification but no inflammatory change. Mild sigmoid colon diverticular disease without acute inflammation. No evidence of bowel wall thickening, distention, or inflammatory changes. Vascular/Lymphatic: Nonaneurysmal aorta. Similar appearance and orientation of IVC filter as compared with multiple prior studies. No significantly enlarged lymph nodes. Reproductive: Prostate is unremarkable. Other: Negative for free air or free fluid. Small fat in the umbilical region Musculoskeletal: Stable chronic compression at L1. IMPRESSION: 1. No CT evidence for acute intra-abdominal or pelvic abnormality. Negative for appendicitis. 2. Possible punctate stones and small amount of layering sludge in the gallbladder. 3. Sigmoid colon diverticular disease without acute inflammation 4. Stable right posterior hepatic lobe septated cyst with calcification. Electronically Signed   By: Donavan Foil M.D.   On: 03/17/2018 20:04   Ct Abdomen Pelvis Wo Contrast  Result Date: 02/28/2018 CLINICAL DATA:  Left lower quadrant pain EXAM: CT ABDOMEN AND PELVIS WITHOUT CONTRAST TECHNIQUE: Multidetector CT imaging of the abdomen and pelvis was performed following the standard protocol without IV contrast. COMPARISON:  11/06/2017 FINDINGS: Lower chest: Small bilateral pleural effusions are noted. Bibasilar atelectatic changes are again seen. Hepatobiliary: Stable cystic changes are again seen in the posterior aspect of the right lobe of  the liver similar to that noted on prior exam. The gallbladder is within normal limits. Pancreas: Unremarkable. No pancreatic ductal dilatation or surrounding inflammatory changes. Spleen: Normal in size without focal abnormality. Adrenals/Urinary Tract: The adrenal glands are within normal limits. The kidneys are well visualized bilaterally without evidence of renal calculi or urinary tract obstructive changes. The bladder is well distended. A tiny focus of air is noted within the nondependent portion of the bladder likely related to prior instrumentation. Stomach/Bowel: Scattered diverticular change of the colon is noted without evidence of diverticulitis. No obstructive or inflammatory changes are seen. The appendix is within normal limits. Vascular/Lymphatic: Aortic calcifications are noted. An IVC filter is noted in stable position. No significant lymphadenopathy is noted. Reproductive: Prostate is unremarkable. Other: No abdominal wall hernia or abnormality. No abdominopelvic ascites. Soft tissue changes are noted in the right inguinal region which may be related to recent vascular access. Musculoskeletal: Degenerative changes of the lumbar spine are seen. Stable compression deformity of L1 is noted. IMPRESSION: Chronic changes as described above without acute abnormality. Soft tissue changes in the right inguinal region likely related to recent vascular access. Electronically Signed   By: Inez Catalina M.D.   On: 02/28/2018 19:41   Dg Chest 2 View  Result Date: 03/17/2018 CLINICAL DATA:  RIGHT lower quadrant pain. History of pulmonary embolism and lupus. EXAM: CHEST - 2 VIEW COMPARISON:  Chest radiograph March 02, 2018 and priors FINDINGS: Cardiomediastinal silhouette is normal. Stable patchy RIGHT lower lobe airspace opacity associated with pleural thickening and diaphragmatic tenting. Trachea projects midline and there is no pneumothorax. Soft tissue planes and included osseous structures are  non-suspicious. Interval removal of RIGHT central venous catheter. IMPRESSION: Similar RIGHT lung base atelectasis/pneumonia. Electronically Signed   By: Elon Alas M.D.   On: 03/17/2018 20:05   Dg Chest 2 View  Result Date: 03/02/2018 CLINICAL DATA:  Cough and congestion. EXAM: CHEST - 2 VIEW COMPARISON:  02/28/2018. FINDINGS: Right PICC line noted with  tip in stable position at cavoatrial junction. Heart size normal. Bibasilar subsegmental again noted. Similar findings on prior exam. No prominent pleural effusion. No pneumothorax. IMPRESSION: 1. Right PICC line noted with tip at cavoatrial junction in stable position. 2. Low lung volumes with mild bibasilar again noted. Similar findings noted on prior exam. Electronically Signed   By: [place]   On: 03/02/2018 15:25   Dg Abdomen 1 View  Result Date: 02/21/2018 CLINICAL DATA:  Orogastric tube placement. EXAM: ABDOMEN - 1 VIEW COMPARISON:  CT 11/06/2017 FINDINGS: Tip and side port of the enteric tube below the diaphragm in the stomach. No bowel dilatation to suggest obstruction. IVC filter slightly tilted to the left, unchanged from prior CT. No evidence of free air. IMPRESSION: Tip and side port of the enteric tube below the diaphragm in the stomach. Electronically Signed   By: Jeb Levering M.D.   On: 02/21/2018 04:18   Ct Head Wo Contrast  Result Date: 02/21/2018 CLINICAL DATA:  57 y/o  M; found unconscious. EXAM: CT HEAD WITHOUT CONTRAST TECHNIQUE: Contiguous axial images were obtained from the base of the skull through the vertex without intravenous contrast. COMPARISON:  12/10/2013 CT head. FINDINGS: Brain: No evidence of acute infarction, hemorrhage, hydrocephalus, extra-axial collection or mass lesion/mass effect. Vascular: No hyperdense vessel. Calcific atherosclerosis of carotid siphons. Skull: Normal. Negative for fracture or focal lesion. Sinuses/Orbits: Multiple paranasal sinus fluid levels. Normal aeration of mastoid air  cells. Orbits incompletely included within field of view, no traumatic finding identified. Other: None. IMPRESSION: 1. No acute intracranial abnormality or calvarial fracture. 2. Multiple sinus fluid levels may represent acute sinusitis or facial trauma, clinical correlation recommended. Electronically Signed   By: Kristine Garbe M.D.   On: 02/21/2018 04:45   Nm Pulmonary Perf And Vent  Result Date: 03/03/2018 CLINICAL DATA:  PE suspected, intermediate prob, neg D-dimer. Pt has a hx of DVT and PE. Last DVT was 2 yrs ago. Last PE was 2014/2015 time frame. SOB EXAM: NUCLEAR MEDICINE VENTILATION - PERFUSION LUNG SCAN TECHNIQUE: Ventilation images were obtained in multiple projections using inhaled aerosol Tc-7m DTPA. Perfusion images were obtained in multiple projections after intravenous injection of Tc-33m-MAA. RADIOPHARMACEUTICALS:  32.4 mCi of Tc-10m DTPA aerosol inhalation and 4.0 mCi Tc68m-MAA IV COMPARISON:  None. FINDINGS: Ventilation: No focal ventilation defect. Decreased ventilation to the RIGHT upper lobe apex. Perfusion: No wedge shaped peripheral perfusion defects to suggest acute pulmonary embolism. Decreased profusion to the RIGHT upper lobe matches the ventilation defect. IMPRESSION: No evidence acute pulmonary embolism. Electronically Signed   By: Suzy Bouchard M.D.   On: 03/03/2018 16:23   Dg Chest Port 1 View  Result Date: 02/28/2018 CLINICAL DATA:  Cough.  Pneumonia. EXAM: PORTABLE CHEST 1 VIEW COMPARISON:  02/26/2018 FINDINGS: Right jugular catheter terminates over the lower SVC. The cardiac silhouette is normal in size. There is improved aeration of the lung bases with mild residual opacity bilaterally. Pleural effusions have also decreased or resolved. No pneumothorax is identified. IMPRESSION: Improved aeration of the lung bases with mild residual opacity, likely atelectasis. Electronically Signed   By: Logan Bores M.D.   On: 02/28/2018 11:51   Dg Chest Port 1  View  Result Date: 02/26/2018 CLINICAL DATA:  Shortness of breath. EXAM: PORTABLE CHEST 1 VIEW COMPARISON:  Single-view of the chest 02/21/2018 and PA and lateral chest 10/01/2015. FINDINGS: Endotracheal tube and NG tube present on the most recent examination has been removed. Right subclavian central venous catheter remains in place.  Aeration in the right mid and lower lung zones is markedly improved since the most recent exam. There is mild left basilar atelectasis. Heart size is normal. No pneumothorax. IMPRESSION: New small left pleural effusion.  No other new abnormality. Right effusion and airspace disease have markedly improved. Electronically Signed   By: Inge Rise M.D.   On: 02/26/2018 11:29   Dg Chest Port 1 View  Result Date: 02/21/2018 CLINICAL DATA:  57 year old male status post central line placement. Respiratory failure, intubated. EXAM: PORTABLE CHEST 1 VIEW COMPARISON:  0311 hours today and earlier. FINDINGS: Portable AP semi upright view at 0944 hours. A right IJ central line now is in place. The catheter tip projects at the cavoatrial junction level. An enteric tube has been placed, courses to the abdomen, tip not included. Endotracheal tube tip is stable up the level the clavicles. More kyphotic positioning now. No pneumothorax identified. Veiling opacity in the right lung. Increased retrocardiac opacity right greater than left. Stable left lung aside from left retrocardiac opacity. Pulmonary vascularity appears stable. IMPRESSION: 1. Right IJ central line placed, tip at the cavoatrial junction level. 2. Enteric tube placed, courses to the abdomen tip not included. 3. No pneumothorax, but increasing veiling and confluent opacity in the right lung suggesting right pleural effusion plus new right lower lobe collapse or consolidation. Electronically Signed   By: Genevie Ann M.D.   On: 02/21/2018 10:04   Dg Chest Portable 1 View  Result Date: 02/21/2018 CLINICAL DATA:  Respiratory failure.   Endotracheal tube placement. EXAM: PORTABLE CHEST 1 VIEW COMPARISON:  Most recent chest imaging CT 04/27/2017 FINDINGS: Endotracheal tube tip 3.2 cm from the carina. Low lung volumes. Borderline cardiomegaly. Ill-defined bilateral perihilar density. More diffuse right lung opacities most prominent in the suprahilar lung. Possible small right pleural effusion. No pneumothorax. IMPRESSION: 1. Endotracheal tube tip 3.2 cm from the carina. 2. Ill-defined bilateral perihilar opacities likely pulmonary edema. 3. Patchy opacities throughout the right lung may be pneumonia, asymmetric pulmonary edema or aspiration. Electronically Signed   By: Jeb Levering M.D.   On: 02/21/2018 03:30     CBC Recent Labs  Lab 03/17/18 1850 03/18/18 0412  WBC 4.0 3.5*  HGB 13.4 11.8*  HCT 40.4 34.9*  PLT 214 170  MCV 87.2 86.1  MCH 29.0 29.2  MCHC 33.2 33.9  RDW 15.2* 14.8*    Chemistries  Recent Labs  Lab 03/17/18 1850 03/18/18 0412  NA 138 140  K 4.2 3.6  CL 106 109  CO2 23 22  GLUCOSE 99 107*  BUN 21* 22*  CREATININE 1.89* 1.68*  CALCIUM 8.6* 8.0*  AST 23  --   ALT 21  --   ALKPHOS 112  --   BILITOT 0.4  --    ------------------------------------------------------------------------------------------------------------------ estimated creatinine clearance is 62.9 mL/min (A) (by C-G formula based on SCr of 1.68 mg/dL (H)). ------------------------------------------------------------------------------------------------------------------ No results for input(s): HGBA1C in the last 72 hours. ------------------------------------------------------------------------------------------------------------------ No results for input(s): CHOL, HDL, LDLCALC, TRIG, CHOLHDL, LDLDIRECT in the last 72 hours. ------------------------------------------------------------------------------------------------------------------ No results for input(s): TSH, T4TOTAL, T3FREE, THYROIDAB in the last 72 hours.  Invalid  input(s): FREET3 ------------------------------------------------------------------------------------------------------------------ No results for input(s): VITAMINB12, FOLATE, FERRITIN, TIBC, IRON, RETICCTPCT in the last 72 hours.  Coagulation profile Recent Labs  Lab 03/17/18 1947  INR 1.08    No results for input(s): DDIMER in the last 72 hours.  Cardiac Enzymes No results for input(s): CKMB, TROPONINI, MYOGLOBIN in the last 168 hours.  Invalid input(s): CK ------------------------------------------------------------------------------------------------------------------  Invalid input(s): Curry   1.   Lower GI bleed.    Likely due to internal hemorrhoids, on chronic anticoagulation which is being held, has been seen by GI 2.    Diarrhea unclear cause now stopped no stool for sampling 3.     Abnormal chest x-ray no cough or congestion likely residual from recent hospitalization atelectasis no antibiotics needed this is not a pneumonia   4. CKD 3.  Creatinine is at baseline, 1.89.  We will continue to monitor kidney function closely and avoid nephrotoxic medications.  5.  Hypertension, stable,  continue home medication 6.  History of DVT/PE.  Will hold Eliquis for now  7.  History of lupus with previous CVA will need to restart Eliquis as soon as possible if there is no plan to do any endoscopy patient will need to be restarted on this tomorrow      Code Status Orders  (From admission, onward)        Start     Ordered   03/17/18 2252  Full code  Continuous     03/17/18 2251    Code Status History    Date Active Date Inactive Code Status Order ID Comments User Context   02/21/2018 0520 03/03/2018 2136 Full Code [id number]  Saundra Shelling, MD Inpatient   05/07/2017 1524 05/09/2017 1711 Full Code [id number]  Epifanio Lesches, MD ED   04/27/2017 1847 05/03/2017 2012 Full Code [id number]  Vaughan Basta, MD Inpatient           Consults   gi  DVT Prophylaxis scd's  Lab Results  Component Value Date   PLT 170 03/18/2018     Time Spent in minutes3min Greater than 50% of time spent in care coordination and counseling patient regarding the condition and plan of care.   Dustin Flock M.D on 03/18/2018 at 2:21 PM  Between 7am to 6pm - Pager - [phone]  After 6pm go to [email or web address] - Proofreader  [employer]   Office  [phone]

## 2018-03-18 NOTE — Progress Notes (Addendum)
Per Dr. Posey Pronto okay to discontinue Enteric and Stool sample orders. If patient does end up having BM place orders and send sample down. Also okay to place order for benadryl 25mg  PO q 6 PRN for rash on arms.

## 2018-03-19 DIAGNOSIS — K529 Noninfective gastroenteritis and colitis, unspecified: Secondary | ICD-10-CM | POA: Diagnosis not present

## 2018-03-19 DIAGNOSIS — N183 Chronic kidney disease, stage 3 (moderate): Secondary | ICD-10-CM | POA: Diagnosis not present

## 2018-03-19 DIAGNOSIS — K922 Gastrointestinal hemorrhage, unspecified: Secondary | ICD-10-CM | POA: Diagnosis not present

## 2018-03-19 LAB — CBC
HCT: 36 % — ABNORMAL LOW (ref 40.0–52.0)
HEMOGLOBIN: 11.6 g/dL — AB (ref 13.0–18.0)
MCH: 28.3 pg (ref 26.0–34.0)
MCHC: 32.3 g/dL (ref 32.0–36.0)
MCV: 87.6 fL (ref 80.0–100.0)
Platelets: 171 10*3/uL (ref 150–440)
RBC: 4.11 MIL/uL — ABNORMAL LOW (ref 4.40–5.90)
RDW: 14.8 % — ABNORMAL HIGH (ref 11.5–14.5)
WBC: 4.1 10*3/uL (ref 3.8–10.6)

## 2018-03-19 LAB — GLUCOSE, CAPILLARY: GLUCOSE-CAPILLARY: 91 mg/dL (ref 65–99)

## 2018-03-19 LAB — BASIC METABOLIC PANEL
ANION GAP: 8 (ref 5–15)
BUN: 28 mg/dL — ABNORMAL HIGH (ref 6–20)
CALCIUM: 8.3 mg/dL — AB (ref 8.9–10.3)
CHLORIDE: 110 mmol/L (ref 101–111)
CO2: 20 mmol/L — AB (ref 22–32)
Creatinine, Ser: 1.47 mg/dL — ABNORMAL HIGH (ref 0.61–1.24)
GFR calc Af Amer: 60 mL/min — ABNORMAL LOW (ref >60)
GFR calc non Af Amer: 52 mL/min — ABNORMAL LOW (ref >60)
Glucose, Bld: 109 mg/dL — ABNORMAL HIGH (ref 65–99)
Potassium: 3.5 mmol/L (ref 3.5–5.1)
Sodium: 138 mmol/L (ref 135–145)

## 2018-03-19 LAB — URINE CULTURE: CULTURE: NO GROWTH

## 2018-03-19 NOTE — Discharge Summary (Signed)
[place] at [facility], [place] y.o., DOB July 02, 1961, MRN [id number]. 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 male with a known history of lupus, chronic kidney disease, DVT and PE on Eliquis.  Patient was recently hospitalized, 3 weeks ago for acute respiratory failure secondary to drug overdose and pneumonia.  She was hospitalized for almost 2 weeks and required IV antibiotics and intubation.  Patient was discharged and return back with diarrhea with some blood in his stool.  Further workup included a CT scan of the abdomen which showed no significant abnormality.  Hemoglobin did drop with IV hydration.  His diarrhea stopped.  No further bleeding was noted.  GI was planning to do an endoscopy however since is no further bleeding noted and has a history of internal hemorrhoids.  Patient's hemoglobin is stable.               Consults  GI  Significant Tests:  See full reports for all details    Ct Abdomen Pelvis Wo Contrast  Result Date: 03/17/2018 CLINICAL DATA:  Right lower quadrant pain EXAM: CT ABDOMEN AND PELVIS WITHOUT CONTRAST TECHNIQUE: Multidetector CT imaging of the abdomen and pelvis was performed following the standard protocol without IV contrast. COMPARISON:  CT  02/28/2018, 11/06/2017, 10/18/2017, radiograph 02/21/2018 FINDINGS: Lower chest: Stable scarring and subpleural nodularity at the right lateral lung base. Normal heart size. Hepatobiliary: No calcified gallstone. Stable septated large cyst in the posterior right hepatic lobe with scattered calcifications. Possible layering stones or sludge in the gallbladder. Pancreas: Unremarkable. No pancreatic ductal dilatation or surrounding inflammatory changes. Spleen: Normal in size without focal abnormality. Adrenals/Urinary Tract: Adrenal glands are unremarkable. Kidneys are normal, without renal calculi, focal lesion, or hydronephrosis. Bladder is unremarkable. Stomach/Bowel: Stomach is within normal limits. Appendix contains calcification but no inflammatory change. Mild sigmoid colon diverticular disease without acute inflammation. No evidence of bowel wall thickening, distention, or inflammatory changes. Vascular/Lymphatic: Nonaneurysmal aorta. Similar appearance and orientation of IVC filter as compared with multiple prior studies. No significantly enlarged lymph nodes. Reproductive: Prostate is unremarkable. Other: Negative for free air or free fluid. Small fat in the umbilical region Musculoskeletal: Stable chronic compression at L1. IMPRESSION: 1. No CT evidence for acute intra-abdominal or pelvic abnormality. Negative for appendicitis. 2. Possible punctate stones and small amount of layering sludge in the gallbladder. 3. Sigmoid colon diverticular disease without acute inflammation 4. Stable right posterior hepatic lobe septated cyst with calcification. Electronically Signed   By: Donavan Foil M.D.   On: 03/17/2018 20:04   Ct Abdomen Pelvis Wo Contrast  Result Date: 02/28/2018 CLINICAL DATA:  Left lower quadrant pain EXAM: CT ABDOMEN AND PELVIS WITHOUT CONTRAST TECHNIQUE: Multidetector CT imaging of the abdomen and pelvis was performed following the standard protocol without IV contrast. COMPARISON:   11/06/2017 FINDINGS: Lower chest: Small bilateral pleural effusions are noted. Bibasilar atelectatic changes are again seen. Hepatobiliary: Stable cystic changes  are again seen in the posterior aspect of the right lobe of the liver similar to that noted on prior exam. The gallbladder is within normal limits. Pancreas: Unremarkable. No pancreatic ductal dilatation or surrounding inflammatory changes. Spleen: Normal in size without focal abnormality. Adrenals/Urinary Tract: The adrenal glands are within normal limits. The kidneys are well visualized bilaterally without evidence of renal calculi or urinary tract obstructive changes. The bladder is well distended. A tiny focus of air is noted within the nondependent portion of the bladder likely related to prior instrumentation. Stomach/Bowel: Scattered diverticular change of the colon is noted without evidence of diverticulitis. No obstructive or inflammatory changes are seen. The appendix is within normal limits. Vascular/Lymphatic: Aortic calcifications are noted. An IVC filter is noted in stable position. No significant lymphadenopathy is noted. Reproductive: Prostate is unremarkable. Other: No abdominal wall hernia or abnormality. No abdominopelvic ascites. Soft tissue changes are noted in the right inguinal region which may be related to recent vascular access. Musculoskeletal: Degenerative changes of the lumbar spine are seen. Stable compression deformity of L1 is noted. IMPRESSION: Chronic changes as described above without acute abnormality. Soft tissue changes in the right inguinal region likely related to recent vascular access. Electronically Signed   By: Inez Catalina M.D.   On: 02/28/2018 19:41   Dg Chest 2 View  Result Date: 03/17/2018 CLINICAL DATA:  RIGHT lower quadrant pain. History of pulmonary embolism and lupus. EXAM: CHEST - 2 VIEW COMPARISON:  Chest radiograph March 02, 2018 and priors FINDINGS: Cardiomediastinal silhouette is normal. Stable  patchy RIGHT lower lobe airspace opacity associated with pleural thickening and diaphragmatic tenting. Trachea projects midline and there is no pneumothorax. Soft tissue planes and included osseous structures are non-suspicious. Interval removal of RIGHT central venous catheter. IMPRESSION: Similar RIGHT lung base atelectasis/pneumonia. Electronically Signed   By: Elon Alas M.D.   On: 03/17/2018 20:05   Dg Chest 2 View  Result Date: 03/02/2018 CLINICAL DATA:  Cough and congestion. EXAM: CHEST - 2 VIEW COMPARISON:  02/28/2018. FINDINGS: Right PICC line noted with tip in stable position at cavoatrial junction. Heart size normal. Bibasilar subsegmental again noted. Similar findings on prior exam. No prominent pleural effusion. No pneumothorax. IMPRESSION: 1. Right PICC line noted with tip at cavoatrial junction in stable position. 2. Low lung volumes with mild bibasilar again noted. Similar findings noted on prior exam. Electronically Signed   By: [place]   On: 03/02/2018 15:25   Dg Abdomen 1 View  Result Date: 02/21/2018 CLINICAL DATA:  Orogastric tube placement. EXAM: ABDOMEN - 1 VIEW COMPARISON:  CT 11/06/2017 FINDINGS: Tip and side port of the enteric tube below the diaphragm in the stomach. No bowel dilatation to suggest obstruction. IVC filter slightly tilted to the left, unchanged from prior CT. No evidence of free air. IMPRESSION: Tip and side port of the enteric tube below the diaphragm in the stomach. Electronically Signed   By: Jeb Levering M.D.   On: 02/21/2018 04:18   Ct Head Wo Contrast  Result Date: 02/21/2018 CLINICAL DATA:  57 y/o  M; found unconscious. EXAM: CT HEAD WITHOUT CONTRAST TECHNIQUE: Contiguous axial images were obtained from the base of the skull through the vertex without intravenous contrast. COMPARISON:  12/10/2013 CT head. FINDINGS: Brain: No evidence of acute infarction, hemorrhage, hydrocephalus, extra-axial collection or mass lesion/mass effect.  Vascular: No hyperdense vessel. Calcific atherosclerosis of carotid siphons. Skull: Normal. Negative for fracture or focal lesion. Sinuses/Orbits: Multiple paranasal sinus fluid levels. Normal aeration of  mastoid air cells. Orbits incompletely included within field of view, no traumatic finding identified. Other: None. IMPRESSION: 1. No acute intracranial abnormality or calvarial fracture. 2. Multiple sinus fluid levels may represent acute sinusitis or facial trauma, clinical correlation recommended. Electronically Signed   By: Kristine Garbe M.D.   On: 02/21/2018 04:45   Nm Pulmonary Perf And Vent  Result Date: 03/03/2018 CLINICAL DATA:  PE suspected, intermediate prob, neg D-dimer. Pt has a hx of DVT and PE. Last DVT was 2 yrs ago. Last PE was 2014/2015 time frame. SOB EXAM: NUCLEAR MEDICINE VENTILATION - PERFUSION LUNG SCAN TECHNIQUE: Ventilation images were obtained in multiple projections using inhaled aerosol Tc-53m DTPA. Perfusion images were obtained in multiple projections after intravenous injection of Tc-32m-MAA. RADIOPHARMACEUTICALS:  32.4 mCi of Tc-37m DTPA aerosol inhalation and 4.0 mCi Tc63m-MAA IV COMPARISON:  None. FINDINGS: Ventilation: No focal ventilation defect. Decreased ventilation to the RIGHT upper lobe apex. Perfusion: No wedge shaped peripheral perfusion defects to suggest acute pulmonary embolism. Decreased profusion to the RIGHT upper lobe matches the ventilation defect. IMPRESSION: No evidence acute pulmonary embolism. Electronically Signed   By: Suzy Bouchard M.D.   On: 03/03/2018 16:23   Dg Chest Port 1 View  Result Date: 02/28/2018 CLINICAL DATA:  Cough.  Pneumonia. EXAM: PORTABLE CHEST 1 VIEW COMPARISON:  02/26/2018 FINDINGS: Right jugular catheter terminates over the lower SVC. The cardiac silhouette is normal in size. There is improved aeration of the lung bases with mild residual opacity bilaterally. Pleural effusions have also decreased or resolved. No  pneumothorax is identified. IMPRESSION: Improved aeration of the lung bases with mild residual opacity, likely atelectasis. Electronically Signed   By: Logan Bores M.D.   On: 02/28/2018 11:51   Dg Chest Port 1 View  Result Date: 02/26/2018 CLINICAL DATA:  Shortness of breath. EXAM: PORTABLE CHEST 1 VIEW COMPARISON:  Single-view of the chest 02/21/2018 and PA and lateral chest 10/01/2015. FINDINGS: Endotracheal tube and NG tube present on the most recent examination has been removed. Right subclavian central venous catheter remains in place. Aeration in the right mid and lower lung zones is markedly improved since the most recent exam. There is mild left basilar atelectasis. Heart size is normal. No pneumothorax. IMPRESSION: New small left pleural effusion.  No other new abnormality. Right effusion and airspace disease have markedly improved. Electronically Signed   By: Inge Rise M.D.   On: 02/26/2018 11:29   Dg Chest Port 1 View  Result Date: 02/21/2018 CLINICAL DATA:  57 year old male status post central line placement. Respiratory failure, intubated. EXAM: PORTABLE CHEST 1 VIEW COMPARISON:  0311 hours today and earlier. FINDINGS: Portable AP semi upright view at 0944 hours. A right IJ central line now is in place. The catheter tip projects at the cavoatrial junction level. An enteric tube has been placed, courses to the abdomen, tip not included. Endotracheal tube tip is stable up the level the clavicles. More kyphotic positioning now. No pneumothorax identified. Veiling opacity in the right lung. Increased retrocardiac opacity right greater than left. Stable left lung aside from left retrocardiac opacity. Pulmonary vascularity appears stable. IMPRESSION: 1. Right IJ central line placed, tip at the cavoatrial junction level. 2. Enteric tube placed, courses to the abdomen tip not included. 3. No pneumothorax, but increasing veiling and confluent opacity in the right lung suggesting right pleural  effusion plus new right lower lobe collapse or consolidation. Electronically Signed   By: Genevie Ann M.D.   On: 02/21/2018 10:04   Dg  Chest Portable 1 View  Result Date: 02/21/2018 CLINICAL DATA:  Respiratory failure.  Endotracheal tube placement. EXAM: PORTABLE CHEST 1 VIEW COMPARISON:  Most recent chest imaging CT 04/27/2017 FINDINGS: Endotracheal tube tip 3.2 cm from the carina. Low lung volumes. Borderline cardiomegaly. Ill-defined bilateral perihilar density. More diffuse right lung opacities most prominent in the suprahilar lung. Possible small right pleural effusion. No pneumothorax. IMPRESSION: 1. Endotracheal tube tip 3.2 cm from the carina. 2. Ill-defined bilateral perihilar opacities likely pulmonary edema. 3. Patchy opacities throughout the right lung may be pneumonia, asymmetric pulmonary edema or aspiration. Electronically Signed   By: Jeb Levering M.D.   On: 02/21/2018 03:30       Today   Subjective:   Jonathon Snow   patient denying any diarrhea no blood in the stool  Objective:   Blood pressure 139/86, pulse 86, temperature 98.4 F (36.9 C), temperature source Oral, resp. rate 18, height 6\' 2"  (1.88 m), weight 224 lb 13.9 oz (102 kg), SpO2 99 %.  .  Intake/Output Summary (Last 24 hours) at 03/19/2018 1435 Last data filed at 03/19/2018 1232 Gross per 24 hour  Intake 1008 ml  Output 1125 ml  Net -117 ml    Exam VITAL SIGNS: Blood pressure 139/86, pulse 86, temperature 98.4 F (36.9 C), temperature source Oral, resp. rate 18, height 6\' 2"  (1.88 m), weight 224 lb 13.9 oz (102 kg), SpO2 99 %.  GENERAL:  57 y.o.-year-old patient lying in the bed with no acute distress.  EYES: Pupils equal, round, reactive to light and accommodation. No scleral icterus. Extraocular muscles intact.  HEENT: Head atraumatic, normocephalic. Oropharynx and nasopharynx clear.  NECK:  Supple, no jugular venous distention. No thyroid enlargement, no tenderness.  LUNGS: Normal breath sounds  bilaterally, no wheezing, rales,rhonchi or crepitation. No use of accessory muscles of respiration.  CARDIOVASCULAR: S1, S2 normal. No murmurs, rubs, or gallops.  ABDOMEN: Soft, nontender, nondistended. Bowel sounds present. No organomegaly or mass.  EXTREMITIES: No pedal edema, cyanosis, or clubbing.  NEUROLOGIC: Cranial nerves II through XII are intact. Muscle strength 5/5 in all extremities. Sensation intact. Gait not checked.  PSYCHIATRIC: The patient is alert and oriented x 3.  SKIN: No obvious rash, lesion, or ulcer.   Data Review     CBC w Diff:  Lab Results  Component Value Date   WBC 4.1 03/19/2018   HGB 11.6 (L) 03/19/2018   HGB 10.4 (L) 04/10/2015   HCT 36.0 (L) 03/19/2018   HCT 31.4 (L) 04/10/2015   PLT 171 03/19/2018   PLT 179 04/10/2015   LYMPHOPCT 18 02/21/2018   LYMPHOPCT 13.8 03/28/2015   MONOPCT 2 02/21/2018   MONOPCT 12.9 03/28/2015   EOSPCT 1 02/21/2018   EOSPCT 2.1 03/28/2015   BASOPCT 1 02/21/2018   BASOPCT 0.2 03/28/2015   CMP:  Lab Results  Component Value Date   NA 138 03/19/2018   NA 141 04/17/2016   NA 138 04/10/2015   K 3.5 03/19/2018   K 3.5 04/10/2015   CL 110 03/19/2018   CL 108 04/10/2015   CO2 20 (L) 03/19/2018   CO2 25 04/10/2015   BUN 28 (H) 03/19/2018   BUN 23 04/17/2016   BUN 16 04/10/2015   CREATININE 1.47 (H) 03/19/2018   CREATININE 2.50 (H) 05/12/2017   PROT 7.6 03/17/2018   PROT 6.9 04/17/2016   PROT 7.0 04/10/2015   ALBUMIN 3.2 (L) 03/17/2018   ALBUMIN 3.9 04/17/2016   ALBUMIN 3.9 04/10/2015   BILITOT 0.4 03/17/2018  BILITOT 0.4 04/17/2016   BILITOT 0.7 04/10/2015   ALKPHOS 112 03/17/2018   ALKPHOS 71 04/10/2015   AST 23 03/17/2018   AST 19 04/10/2015   ALT 21 03/17/2018   ALT 16 (L) 04/10/2015  .  Micro Results Recent Results (from the past 240 hour(s))  Urine culture     Status: None   Collection Time: 03/17/18  6:50 PM  Result Value Ref Range Status   Specimen Description   Final    URINE,  RANDOM Performed at [facility], [address], [place], [place] [address]    Special Requests   Final    NONE Performed at [facility], [address], [place], [place] [address]    Culture   Final    NO GROWTH Performed at [facility], [place] [address], [place], [place] [address]    Report Status 03/19/2018 FINAL  Final  Blood culture (routine x 2)     Status: None (Preliminary result)   Collection Time: 03/17/18  9:07 PM  Result Value Ref Range Status   Specimen Description BLOOD LEFT ANTECUBITAL  Final   Special Requests   Final    BOTTLES DRAWN AEROBIC AND ANAEROBIC Blood Culture results may not be optimal due to an excessive volume of blood received in culture bottles   Culture   Final    NO GROWTH 2 DAYS Performed at [facility], [address] [place], [place] [address]    Report Status PENDING  Incomplete  Blood culture (routine x 2)     Status: None (Preliminary result)   Collection Time: 03/17/18  9:07 PM  Result Value Ref Range Status   Specimen Description BLOOD RIGHT ANTECUBITAL  Final   Special Requests   Final    BOTTLES DRAWN AEROBIC AND ANAEROBIC Blood Culture results may not be optimal due to an excessive volume of blood received in culture bottles   Culture   Final    NO GROWTH 2 DAYS Performed at [facility], [address], [place], [place] [address]    Report Status PENDING  Incomplete        Code Status Orders  (From admission, onward)        Start     Ordered   03/17/18 2252  Full code  Continuous     03/17/18 2251    Code Status History    Date Active Date Inactive Code Status Order ID Comments User Context   02/21/2018 0520 03/03/2018 2136 Full Code [id number]  Saundra Shelling, MD Inpatient   05/07/2017 1524 05/09/2017 1711 Full Code [id number]  Epifanio Lesches, MD ED   04/27/2017 1847 05/03/2017 2012 Full Code [id number]  Vaughan Basta, MD Inpatient          Follow-up  Information    Arnetha Courser, MD In 6 days.   Specialty:  Family Medicine Why:  Dr. Sanda Klein will notify you with your hospital follow-up. Thank you! Contact information: [address] [place] [address] [phone]        Virgel Manifold, MD. Go on 03/29/2018.   Specialty:  Gastroenterology Why:  Monday at 9:30am for hospital follow-up/GI Bleed Contact information: [place] [address] [phone]           Discharge Medications   Allergies as of 03/19/2018      Reactions   Hydrocodone Rash   Vicodin [hydrocodone-acetaminophen] Hives, Rash   Severe headaches (also)  Medication List    TAKE these medications   amLODipine 2.5 MG tablet Commonly known as:  NORVASC Take 1 tablet (2.5 mg total) by mouth at bedtime.   apixaban 5 MG Tabs tablet Commonly known as:  ELIQUIS Take 1 tablet (5 mg total) by mouth 2 (two) times daily.   Blood Pressure Monitor Devi I10, check blood pressures daily and as needed; LON 99 months   ipratropium-albuterol 0.5-2.5 (3) MG/3ML Soln Commonly known as:  DUONEB Take 3 mLs by nebulization every 4 (four) hours as needed.   losartan 100 MG tablet Commonly known as:  COZAAR Take 100 mg by mouth daily.   mometasone 50 MCG/ACT nasal spray Commonly known as:  NASONEX Place 2 sprays into the nose daily.   multivitamin with minerals tablet Take 1 tablet by mouth daily.   mycophenolate 500 MG tablet Commonly known as:  CELLCEPT Take 1,000 mg by mouth 2 (two) times daily.   ondansetron 4 MG disintegrating tablet Commonly known as:  ZOFRAN ODT Take 1 tablet (4 mg total) by mouth every 8 (eight) hours as needed for nausea or vomiting.   pantoprazole 40 MG tablet Commonly known as:  PROTONIX Take 1 tablet (40 mg total) by mouth daily.   QUEtiapine 100 MG tablet Commonly known as:  SEROQUEL Take 1 tablet (100 mg total) by mouth at bedtime.          Total Time in preparing paper  work, data evaluation and todays exam - 13 minutes  Dustin Flock M.D on 03/19/2018 at 2:35 PM [place]  [phone]

## 2018-03-19 NOTE — Progress Notes (Signed)
Jonathon Snow  A and O x 4. VSS. Pt tolerating diet well. No complaints of pain or nausea. IV removed intact,no new prescriptions given. Pt voiced understanding of discharge instructions with no further questions. Pt discharged via wheelchair with nurse aide.  Lynann Bologna MSN, RN-BC  Allergies as of 03/19/2018      Reactions   Hydrocodone Rash   Vicodin [hydrocodone-acetaminophen] Hives, Rash   Severe headaches (also)      Medication List    TAKE these medications   amLODipine 2.5 MG tablet Commonly known as:  NORVASC Take 1 tablet (2.5 mg total) by mouth at bedtime.   apixaban 5 MG Tabs tablet Commonly known as:  ELIQUIS Take 1 tablet (5 mg total) by mouth 2 (two) times daily.   Blood Pressure Monitor Devi I10, check blood pressures daily and as needed; LON 99 months   ipratropium-albuterol 0.5-2.5 (3) MG/3ML Soln Commonly known as:  DUONEB Take 3 mLs by nebulization every 4 (four) hours as needed.   losartan 100 MG tablet Commonly known as:  COZAAR Take 100 mg by mouth daily.   mometasone 50 MCG/ACT nasal spray Commonly known as:  NASONEX Place 2 sprays into the nose daily.   multivitamin with minerals tablet Take 1 tablet by mouth daily.   mycophenolate 500 MG tablet Commonly known as:  CELLCEPT Take 1,000 mg by mouth 2 (two) times daily.   ondansetron 4 MG disintegrating tablet Commonly known as:  ZOFRAN ODT Take 1 tablet (4 mg total) by mouth every 8 (eight) hours as needed for nausea or vomiting.   pantoprazole 40 MG tablet Commonly known as:  PROTONIX Take 1 tablet (40 mg total) by mouth daily.   QUEtiapine 100 MG tablet Commonly known as:  SEROQUEL Take 1 tablet (100 mg total) by mouth at bedtime.       Vitals:   03/19/18 1012 03/19/18 1224  BP: 127/83 139/86  Pulse:  86  Resp:  18  Temp:  98.4 F (36.9 C)  SpO2:  99%

## 2018-03-19 NOTE — Progress Notes (Signed)
Vonda Antigua, MD [address] [place], [place], [place], [address] [address], [place], [place], [place], [address] Phone: [phone]  Fax: [phone]   Subjective:  Patient has not had a bowel movement in 2 days.  Is tolerating oral diet without nausea vomiting or diarrhea or abdominal pain.  Objective: Exam: Vital signs in last 24 hours: Vitals:   03/19/18 0444 03/19/18 0508 03/19/18 1012 03/19/18 1224  BP: 120/79  127/83 139/86  Pulse: 85   86  Resp: 18   18  Temp: 98.1 F (36.7 C)   98.4 F (36.9 C)  TempSrc: Oral   Oral  SpO2: 97%   99%  Weight:  224 lb 13.9 oz (102 kg)    Height:       Weight change: 1 lb 13.9 oz (0.848 kg)  Intake/Output Summary (Last 24 hours) at 03/19/2018 1308 Last data filed at 03/19/2018 1232 Gross per 24 hour  Intake 1488 ml  Output 1125 ml  Net 363 ml    General: No acute distress, AAO x3 Abd: Soft, NT/ND, No HSM Skin: Warm, no rashes Neck: Supple, Trachea midline   Lab Results: Lab Results  Component Value Date   WBC 4.1 03/19/2018   HGB 11.6 (L) 03/19/2018   HCT 36.0 (L) 03/19/2018   MCV 87.6 03/19/2018   PLT 171 03/19/2018   Micro Results: Recent Results (from the past 240 hour(s))  Urine culture     Status: None   Collection Time: 03/17/18  6:50 PM  Result Value Ref Range Status   Specimen Description   Final    URINE, RANDOM Performed at [facility], [address], [place], [place] [address]    Special Requests   Final    NONE Performed at [facility], [address], [place], [place] [address]    Culture   Final    NO GROWTH Performed at [facility], [place] [address] [place], [place] [address]    Report Status 03/19/2018 FINAL  Final  Blood culture (routine x 2)     Status: None (Preliminary result)   Collection Time: 03/17/18  9:07 PM  Result Value Ref Range Status   Specimen Description BLOOD LEFT ANTECUBITAL  Final   Special Requests   Final    BOTTLES DRAWN  AEROBIC AND ANAEROBIC Blood Culture results may not be optimal due to an excessive volume of blood received in culture bottles   Culture   Final    NO GROWTH 2 DAYS Performed at [facility], [address] [place], [place] [address]    Report Status PENDING  Incomplete  Blood culture (routine x 2)     Status: None (Preliminary result)   Collection Time: 03/17/18  9:07 PM  Result Value Ref Range Status   Specimen Description BLOOD RIGHT ANTECUBITAL  Final   Special Requests   Final    BOTTLES DRAWN AEROBIC AND ANAEROBIC Blood Culture results may not be optimal due to an excessive volume of blood received in culture bottles   Culture   Final    NO GROWTH 2 DAYS Performed at [facility], [address] [place], [place] [address]    Report Status PENDING  Incomplete   Studies/Results: Ct Abdomen Pelvis Wo Contrast  Result Date: 03/17/2018 CLINICAL DATA:  Right lower quadrant pain EXAM: CT ABDOMEN AND PELVIS WITHOUT CONTRAST TECHNIQUE: Multidetector CT imaging of the abdomen and pelvis was performed following the standard protocol without IV contrast. COMPARISON:  CT 02/28/2018, 11/06/2017, 10/18/2017, radiograph 02/21/2018 FINDINGS: Lower chest: Stable scarring and subpleural nodularity at the right lateral lung base. Normal heart size. Hepatobiliary: No calcified gallstone. Stable septated large cyst in the posterior right hepatic lobe with scattered calcifications. Possible layering stones or sludge in the gallbladder. Pancreas: Unremarkable. No pancreatic ductal dilatation or surrounding inflammatory changes. Spleen: Normal in size without focal abnormality. Adrenals/Urinary Tract: Adrenal glands are unremarkable. Kidneys are normal, without renal calculi, focal lesion, or hydronephrosis. Bladder is unremarkable. Stomach/Bowel: Stomach is within normal limits. Appendix contains calcification but no inflammatory change. Mild sigmoid colon diverticular disease without acute  inflammation. No evidence of bowel wall thickening, distention, or inflammatory changes. Vascular/Lymphatic: Nonaneurysmal aorta. Similar appearance and orientation of IVC filter as compared with multiple prior studies. No significantly enlarged lymph nodes. Reproductive: Prostate is unremarkable. Other: Negative for free air or free fluid. Small fat in the umbilical region Musculoskeletal: Stable chronic compression at L1. IMPRESSION: 1. No CT evidence for acute intra-abdominal or pelvic abnormality. Negative for appendicitis. 2. Possible punctate stones and small amount of layering sludge in the gallbladder. 3. Sigmoid colon diverticular disease without acute inflammation 4. Stable right posterior hepatic lobe septated cyst with calcification. Electronically Signed   By: Donavan Foil M.D.   On: 03/17/2018 20:04   Dg Chest 2 View  Result Date: 03/17/2018 CLINICAL DATA:  RIGHT lower quadrant pain. History of pulmonary embolism and lupus. EXAM: CHEST - 2 VIEW COMPARISON:  Chest radiograph March 02, 2018 and priors FINDINGS: Cardiomediastinal silhouette is normal. Stable patchy RIGHT lower lobe airspace opacity associated with pleural thickening and diaphragmatic tenting. Trachea projects midline and there is no pneumothorax. Soft tissue planes and included osseous structures are non-suspicious. Interval removal of RIGHT central venous catheter. IMPRESSION: Similar RIGHT lung base atelectasis/pneumonia. Electronically Signed   By: Elon Alas M.D.   On: 03/17/2018 20:05   Medications:  Scheduled Meds: . amLODipine  2.5 mg Oral QHS  . docusate sodium  100 mg Oral BID  . fluticasone  1 spray Each Nare Daily  . losartan  100 mg Oral Daily  . multivitamin with minerals  1 tablet Oral Daily  . mycophenolate  1,000 mg Oral BID  . pantoprazole  40 mg Oral Daily  . protein supplement shake  11 oz Oral BID BM  . QUEtiapine  100 mg Oral QHS   Continuous Infusions: PRN Meds:.bisacodyl, diphenhydrAMINE,  ipratropium-albuterol, ondansetron **OR** ondansetron (ZOFRAN) IV   Assessment: Active Problems:   GIB (gastrointestinal bleeding)    Plan: GI bleed is completely resolved His hemoglobin stayed at baseline since presentation Hematochezia was likely due to hemorrhoids or infectious diarrhea Stool could not be collected as patient did not have any bowel movement since presentation, and does hematochezia was likely due to hemorrhoids more than infectious diarrhea, since that usually takes few days to resolve  I discussed the patient with primary attending, Dr. Posey Pronto, who recommends holding off on his endoscopies, given his recent pneumonia, and chest x-ray on this admission should still showing persistent pneumonia.  He would also really like to restart his Plavix at this time.  Given that his GI bleeding is resolved, this is reasonable, since the risk of the procedure would outweigh the benefits given his clinical status at this time.  He can be followed as an outpatient, and outpatient colonoscopy can be scheduled at that time, once acute medical issues resolved, and benefits of procedure outweigh the risks at that time  If bright red blood per rectum  reoccurs, or any other reason for concern, endoscopy can be considered sooner   LOS: 2 days   Vonda Antigua, MD 03/19/2018, 1:08 PM

## 2018-03-19 NOTE — Telephone Encounter (Addendum)
Daughter called to let Dr Sanda Klein know the pt was admitted to the hospital. They will keep the appt 4/05 unless we hear otherwise.  [name] wants to know what to do about seeing a pulmonologist? Will you go ahead and schedule? Jonelle Sidle is going to mention to the hospital dr as well about his SOB with exertion.

## 2018-03-19 NOTE — Telephone Encounter (Signed)
I put a referral in to pulmonology on 03/16/18 (see referral tab) I hope he gets to feeling better soon and we'll see him on April 5th

## 2018-03-20 ENCOUNTER — Other Ambulatory Visit: Payer: Self-pay

## 2018-03-20 ENCOUNTER — Emergency Department
Admission: EM | Admit: 2018-03-20 | Discharge: 2018-03-20 | Disposition: A | Discharge status: Home / Self Care | Payer: 59 | Attending: Emergency Medicine | Admitting: Emergency Medicine

## 2018-03-20 DIAGNOSIS — R0989 Other specified symptoms and signs involving the circulatory and respiratory systems: Secondary | ICD-10-CM | POA: Diagnosis not present

## 2018-03-20 DIAGNOSIS — J449 Chronic obstructive pulmonary disease, unspecified: Secondary | ICD-10-CM | POA: Insufficient documentation

## 2018-03-20 DIAGNOSIS — N183 Chronic kidney disease, stage 3 (moderate): Secondary | ICD-10-CM | POA: Insufficient documentation

## 2018-03-20 DIAGNOSIS — L500 Allergic urticaria: Secondary | ICD-10-CM | POA: Diagnosis not present

## 2018-03-20 DIAGNOSIS — L509 Urticaria, unspecified: Secondary | ICD-10-CM | POA: Diagnosis not present

## 2018-03-20 DIAGNOSIS — Z7901 Long term (current) use of anticoagulants: Secondary | ICD-10-CM | POA: Diagnosis not present

## 2018-03-20 DIAGNOSIS — Z79899 Other long term (current) drug therapy: Secondary | ICD-10-CM | POA: Insufficient documentation

## 2018-03-20 DIAGNOSIS — I129 Hypertensive chronic kidney disease with stage 1 through stage 4 chronic kidney disease, or unspecified chronic kidney disease: Secondary | ICD-10-CM | POA: Diagnosis not present

## 2018-03-20 DIAGNOSIS — Q446 Cystic disease of liver: Secondary | ICD-10-CM | POA: Diagnosis not present

## 2018-03-20 DIAGNOSIS — R21 Rash and other nonspecific skin eruption: Secondary | ICD-10-CM | POA: Diagnosis present

## 2018-03-20 MED ORDER — PREDNISONE 20 MG PO TABS: 60.0000 mg | ORAL_TABLET | Freq: Once | ORAL | Status: DC

## 2018-03-20 MED ORDER — HYDROXYZINE HCL 25 MG PO TABS: 25.0000 mg | ORAL_TABLET | Freq: Three times a day (TID) | ORAL | 0 refills | Status: DC | PRN

## 2018-03-20 MED ORDER — PREDNISONE 20 MG PO TABS: 40.0000 mg | ORAL_TABLET | Freq: Every day | ORAL | 0 refills | Status: DC

## 2018-03-20 MED ORDER — FAMOTIDINE 20 MG PO TABS
40.0000 mg | ORAL_TABLET | Freq: Once | ORAL | Status: AC
  Administered 2018-03-20: 40 mg via ORAL
  Filled 2018-03-20: qty 2

## 2018-03-20 MED ORDER — PREDNISONE 20 MG PO TABS
40.0000 mg | ORAL_TABLET | Freq: Once | ORAL | Status: AC
  Administered 2018-03-20: 40 mg via ORAL
  Filled 2018-03-20: qty 2

## 2018-03-20 MED ORDER — EPINEPHRINE 0.3 MG/0.3ML IJ SOAJ: 0.3000 mg | Freq: Once | INTRAMUSCULAR | 0 refills | Status: AC

## 2018-03-20 NOTE — ED Triage Notes (Signed)
Patient reports he was discharged from this hospital yesterday - here for rectal bleeding. Patient began to have a rash on bilateral arms prior to discharge. Patient was received 3 doses benadryl in the hospital prior to discharge. At 2200 last night patient reports the rash began to spread. Patient now has rash to arms, chest, back and head. Patient reports throat discomfort.
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
## 2018-03-22 ENCOUNTER — Telehealth: Payer: Self-pay

## 2018-03-22 ENCOUNTER — Telehealth: Payer: Self-pay | Admitting: Family Medicine

## 2018-03-22 LAB — CULTURE, BLOOD (ROUTINE X 2)
CULTURE: NO GROWTH
Culture: NO GROWTH

## 2018-03-22 NOTE — Telephone Encounter (Signed)
TOC #1. Called pt to f/u after d/c from [facility] on 03/20/18. Also wanted to confirm his hosp f/u appt w/ Suezanne Cheshire, FNP on 03/26/18 @ 10:20am. Discharge planning includes the following:   - f/u with Dr. Sanda Klein in one week - f/u with Dr. Bonna Gains on 03/29/18 @ 9:30am - Continue Prednisone, Atarax and Epi-Pen As part of the TOC f/u call, I am also wanting to discuss/review the above information with the pt to ensure all of the above has been taken care of. LVM requesting returned call.

## 2018-03-22 NOTE — Assessment & Plan Note (Signed)
While hospitalized; hydrate; antiemetic Rxd to facilitate hydration

## 2018-03-22 NOTE — Telephone Encounter (Signed)
Copied from [place] [id number]. Topic: Inquiry >> Mar 22, 2018  2:19 PM Margot Ables wrote: Reason for CRM: pt states that [employer] sent FMLA request 03/17/18 and was requested to be returned within 5 days (today is the last day). They have not received anything. Pt is requesting call back to f/u on FMLA. Pt requesting f/u today.

## 2018-03-22 NOTE — Assessment & Plan Note (Signed)
On cellcept; f/u with specialist

## 2018-03-22 NOTE — Telephone Encounter (Signed)
This was for medical records, and our medical records person picked those up today.

## 2018-03-26 ENCOUNTER — Ambulatory Visit: Payer: Self-pay | Admitting: Nurse Practitioner

## 2018-03-26 DIAGNOSIS — I10 Essential (primary) hypertension: Secondary | ICD-10-CM | POA: Diagnosis not present

## 2018-03-26 DIAGNOSIS — N183 Chronic kidney disease, stage 3 (moderate): Secondary | ICD-10-CM | POA: Diagnosis not present

## 2018-03-26 DIAGNOSIS — M3214 Glomerular disease in systemic lupus erythematosus: Secondary | ICD-10-CM | POA: Diagnosis not present

## 2018-03-29 ENCOUNTER — Ambulatory Visit: Payer: 59 | Admitting: Gastroenterology

## 2018-03-29 ENCOUNTER — Ambulatory Visit: Payer: 59 | Admitting: Nurse Practitioner

## 2018-03-29 ENCOUNTER — Encounter: Payer: Self-pay | Admitting: Nurse Practitioner

## 2018-03-29 VITALS — BP 118/72 | HR 80 | Temp 98.3°F | Resp 18 | Ht 74.0 in | Wt 229.9 lb

## 2018-03-29 DIAGNOSIS — M79605 Pain in left leg: Secondary | ICD-10-CM

## 2018-03-29 DIAGNOSIS — R531 Weakness: Secondary | ICD-10-CM | POA: Diagnosis not present

## 2018-03-29 DIAGNOSIS — M79604 Pain in right leg: Secondary | ICD-10-CM

## 2018-03-29 DIAGNOSIS — R0602 Shortness of breath: Secondary | ICD-10-CM

## 2018-03-29 DIAGNOSIS — M3214 Glomerular disease in systemic lupus erythematosus: Secondary | ICD-10-CM

## 2018-03-29 DIAGNOSIS — Z09 Encounter for follow-up examination after completed treatment for conditions other than malignant neoplasm: Secondary | ICD-10-CM | POA: Diagnosis not present

## 2018-03-29 DIAGNOSIS — I2782 Chronic pulmonary embolism: Secondary | ICD-10-CM | POA: Diagnosis not present

## 2018-03-29 DIAGNOSIS — J449 Chronic obstructive pulmonary disease, unspecified: Secondary | ICD-10-CM | POA: Diagnosis not present
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
## 2018-03-29 NOTE — Patient Instructions (Signed)
  Follow these instructions at home:  You can take a tylenol for you headache, not all the time but sparingly -your liver enzymes are normal  Your CT scan did show gall sludge so please Eat healthy foods. This includes: ? Eating fewer fatty foods, like fried foods. ? Eating fewer refined carbs (refined carbohydrates). Refined carbs are breads and grains that are highly processed, like white bread and white rice. Instead, choose whole grains like whole-wheat bread and brown rice. ? Eating more fiber. Almonds, fresh fruit, and beans are healthy sources of fiber.  Keep all follow-up visits as told by your doctor. This is important. ?  Get help right away if:  You have sudden chest pain, shortness of breath, blood in your stools or vomit   You have belly pain that lasts for more than 5 hours.  You have a fever or chills.  You keep feeling sick to your stomach or you keep throwing up.  Your skin or the whites of your eyes turn yellow (jaundice).  You have dark-colored pee (urine).  You have light-colored poop (stool). S

## 2018-03-30 ENCOUNTER — Encounter: Payer: Self-pay | Admitting: Emergency Medicine

## 2018-04-02 ENCOUNTER — Telehealth: Payer: Self-pay | Admitting: Family Medicine

## 2018-04-02 NOTE — Telephone Encounter (Signed)
I will need a signed release before I can talk to anybody from [place]

## 2018-04-02 NOTE — Telephone Encounter (Signed)
Copied from [place]. Topic: Quick Communication - See Telephone Encounter >> Apr 02, 2018  2:53 PM Bea Graff, NT wrote: CRM for notification. See Telephone encounter for: 04/02/18. Aaron Edelman with [employer] is needing to know if pt was released back to work and if not an updated treatment plan. CB#: [phone] Claim#: [id number]

## 2018-04-02 NOTE — Telephone Encounter (Signed)
Please see below and advise. Thanks

## 2018-04-07 ENCOUNTER — Institutional Professional Consult (permissible substitution): Payer: 59 | Admitting: Internal Medicine

## 2018-04-07 NOTE — Telephone Encounter (Signed)
Patient has been notified to come in and sign a medical release form.

## 2018-04-08 ENCOUNTER — Telehealth: Payer: Self-pay

## 2018-04-08 NOTE — Telephone Encounter (Signed)
Copied from [place] [id number]. Topic: General - Other >> Apr 08, 2018  2:12 PM Yvette Rack wrote: Reason for CRM: patient calling about his FMLA paperwork stating that his company had faxed it to the office last Friday and that they need it by tomorrow   Form is in your bid, I highlighted it urgent. Thanks

## 2018-04-08 NOTE — Telephone Encounter (Signed)
Just need signed release please

## 2018-04-09 NOTE — Telephone Encounter (Signed)
Called company they will be faxing signed release

## 2018-04-12 ENCOUNTER — Other Ambulatory Visit: Payer: Self-pay | Admitting: Family Medicine

## 2018-04-12 DIAGNOSIS — R0982 Postnasal drip: Secondary | ICD-10-CM

## 2018-04-12 MED ORDER — MOMETASONE FUROATE 50 MCG/ACT NA SUSP: 2.0000 | Freq: Every day | NASAL | 11 refills | Status: DC

## 2018-04-13 ENCOUNTER — Institutional Professional Consult (permissible substitution): Payer: 59 | Admitting: Internal Medicine

## 2018-04-14 ENCOUNTER — Telehealth: Payer: Self-pay | Admitting: Family Medicine

## 2018-04-14 NOTE — Telephone Encounter (Signed)
Please contact patient, confirm which day he returned to work The last letter in the chart says March 31, 2018 Jonathon Snow actually saw him last but she is not here

## 2018-04-14 NOTE — Telephone Encounter (Signed)
Copied from [place] [id number]. Topic: Quick Communication - See Telephone Encounter >> Apr 14, 2018  1:43 PM Vernona Rieger wrote: CRM for notification. See Telephone encounter for: 04/14/18.  Hunter from [place] disability needs the date when the patient returned to work. Call back is [phone]

## 2018-04-14 NOTE — Telephone Encounter (Signed)
Notified. 

## 2018-04-14 NOTE — Telephone Encounter (Signed)
Left voice mail

## 2018-04-16 NOTE — Telephone Encounter (Signed)
Left voicemail that his forms were indeed faxed with confirmation on 4/22, but I did reprint and refax again.

## 2018-04-16 NOTE — Telephone Encounter (Signed)
Patient calling and states [employer] has still not received medical info. Please contact [employer] and patient would like a call back as well, [phone]

## 2018-04-20 ENCOUNTER — Institutional Professional Consult (permissible substitution): Payer: 59 | Admitting: Internal Medicine

## 2018-04-26 NOTE — Progress Notes (Deleted)
BarryKellisis a56 y.o.malewith a history of DVT, PE on Eliquis, chronic kidney disease.  Patient was recently hospitalized for drug overdose and pneumonia, and again on 03/19/2018 with blood in stool.  He returned to the ED on 02/21/2018 for upper extremity rash  **Imaging personally reviewed chest x-ray 03/17/2018; hyperinflation suggestive of emphysema.  Similar findings seen on CT chest 04/27/2017

## 2018-04-27 ENCOUNTER — Institutional Professional Consult (permissible substitution): Payer: 59 | Admitting: Internal Medicine

## 2018-04-27 ENCOUNTER — Ambulatory Visit: Payer: 59 | Admitting: Gastroenterology

## 2018-04-27 ENCOUNTER — Encounter: Payer: Self-pay | Admitting: *Deleted

## 2018-04-28 ENCOUNTER — Encounter: Payer: Self-pay | Admitting: Internal Medicine

## 2018-05-03 IMAGING — DX DG CHEST 1V PORT
1 series · 1 of 1 positions shown · non-contrast
Comparison: 4499 hours today and earlier.

CLINICAL DATA: 56-year-old male status post central line placement.
Respiratory failure, intubated.

EXAM:
PORTABLE CHEST 1 VIEW

[chest ap]
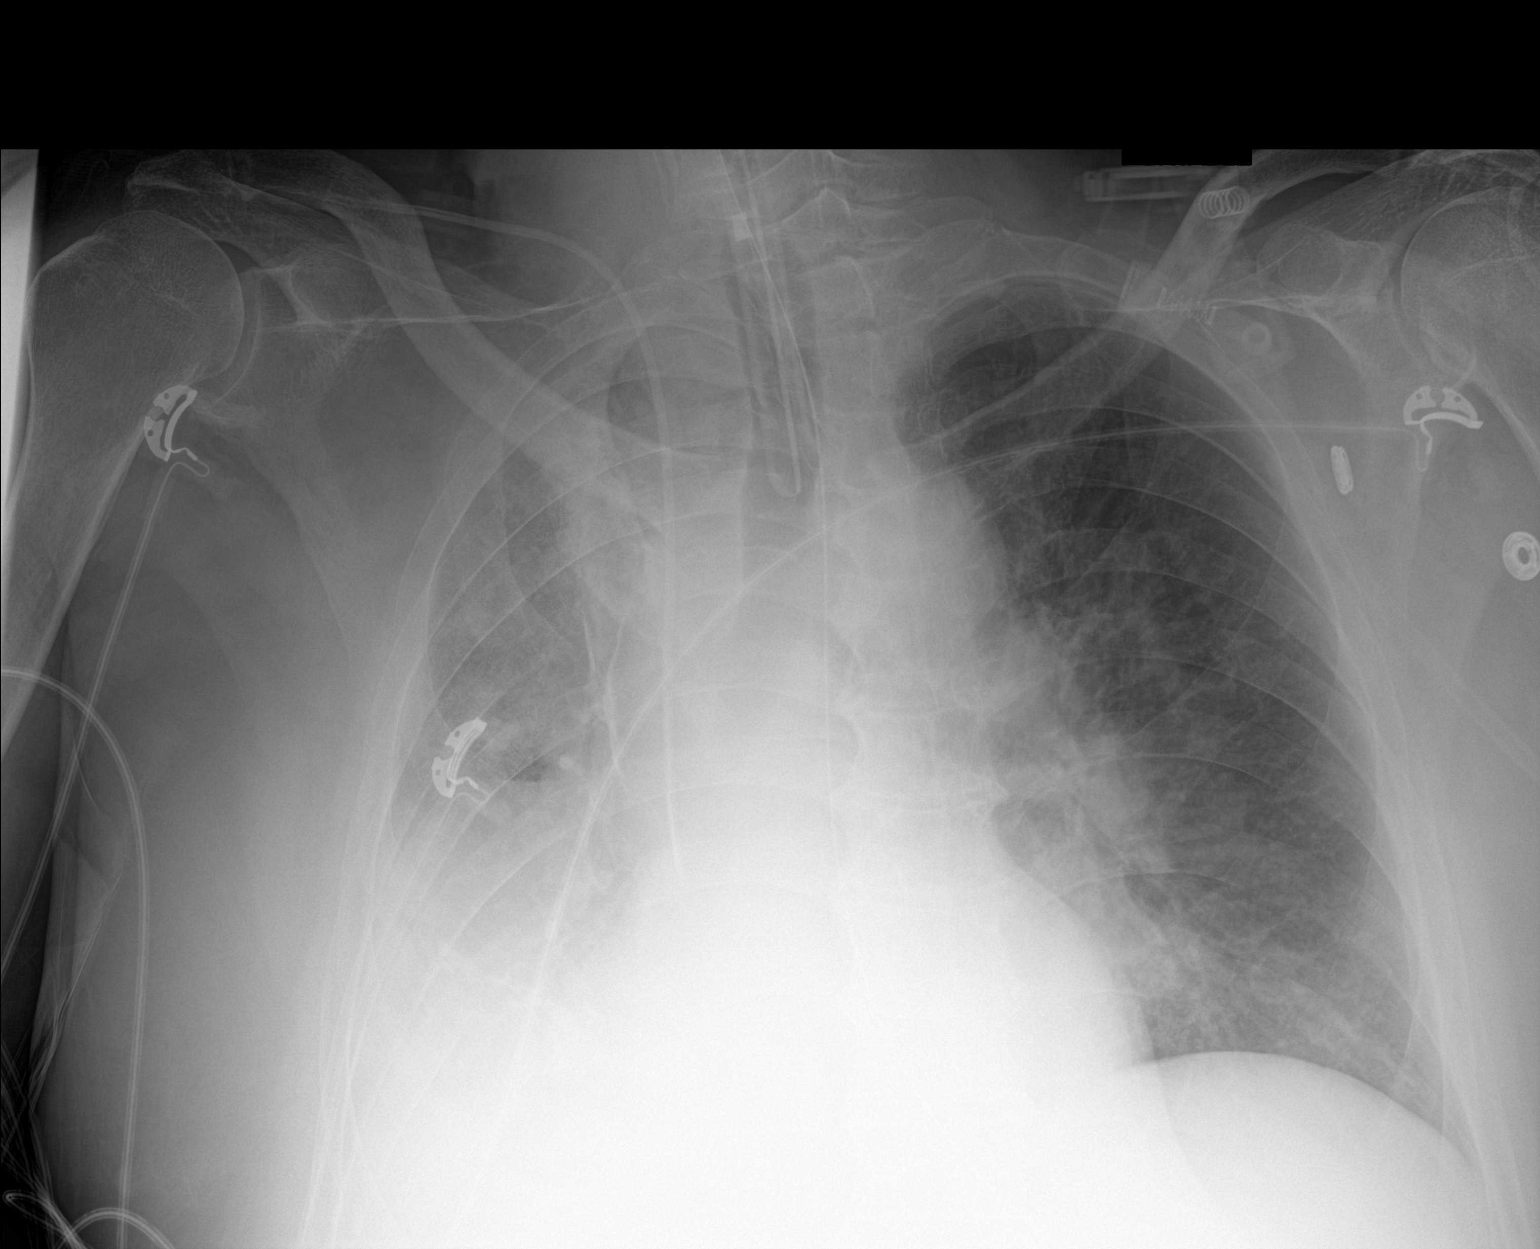

[1 of 1 positions shown; findings below may reference images not displayed]

FINDINGS: Portable AP semi upright view at 3499 hours. A right IJ central line
now is in place. The catheter tip projects at the cavoatrial
junction level. An enteric tube has been placed, courses to the
abdomen, tip not included. Endotracheal tube tip is stable up the
level the clavicles.

More kyphotic positioning now. No pneumothorax identified. Veiling
opacity in the right lung. Increased retrocardiac opacity right
greater than left. Stable left lung aside from left retrocardiac
opacity. Pulmonary vascularity appears stable.
IMPRESSION: 1. Right IJ central line placed, tip at the cavoatrial junction
level.
2. Enteric tube placed, courses to the abdomen tip not included.
3. No pneumothorax, but increasing veiling and confluent opacity in
the right lung suggesting right pleural effusion plus new right
lower lobe collapse or consolidation.

## 2018-05-10 IMAGING — DX DG CHEST 1V PORT
1 series · 1 of 1 positions shown · non-contrast
Comparison: 02/26/2018

CLINICAL DATA: Cough.  Pneumonia.

EXAM:
PORTABLE CHEST 1 VIEW

[chest ap]
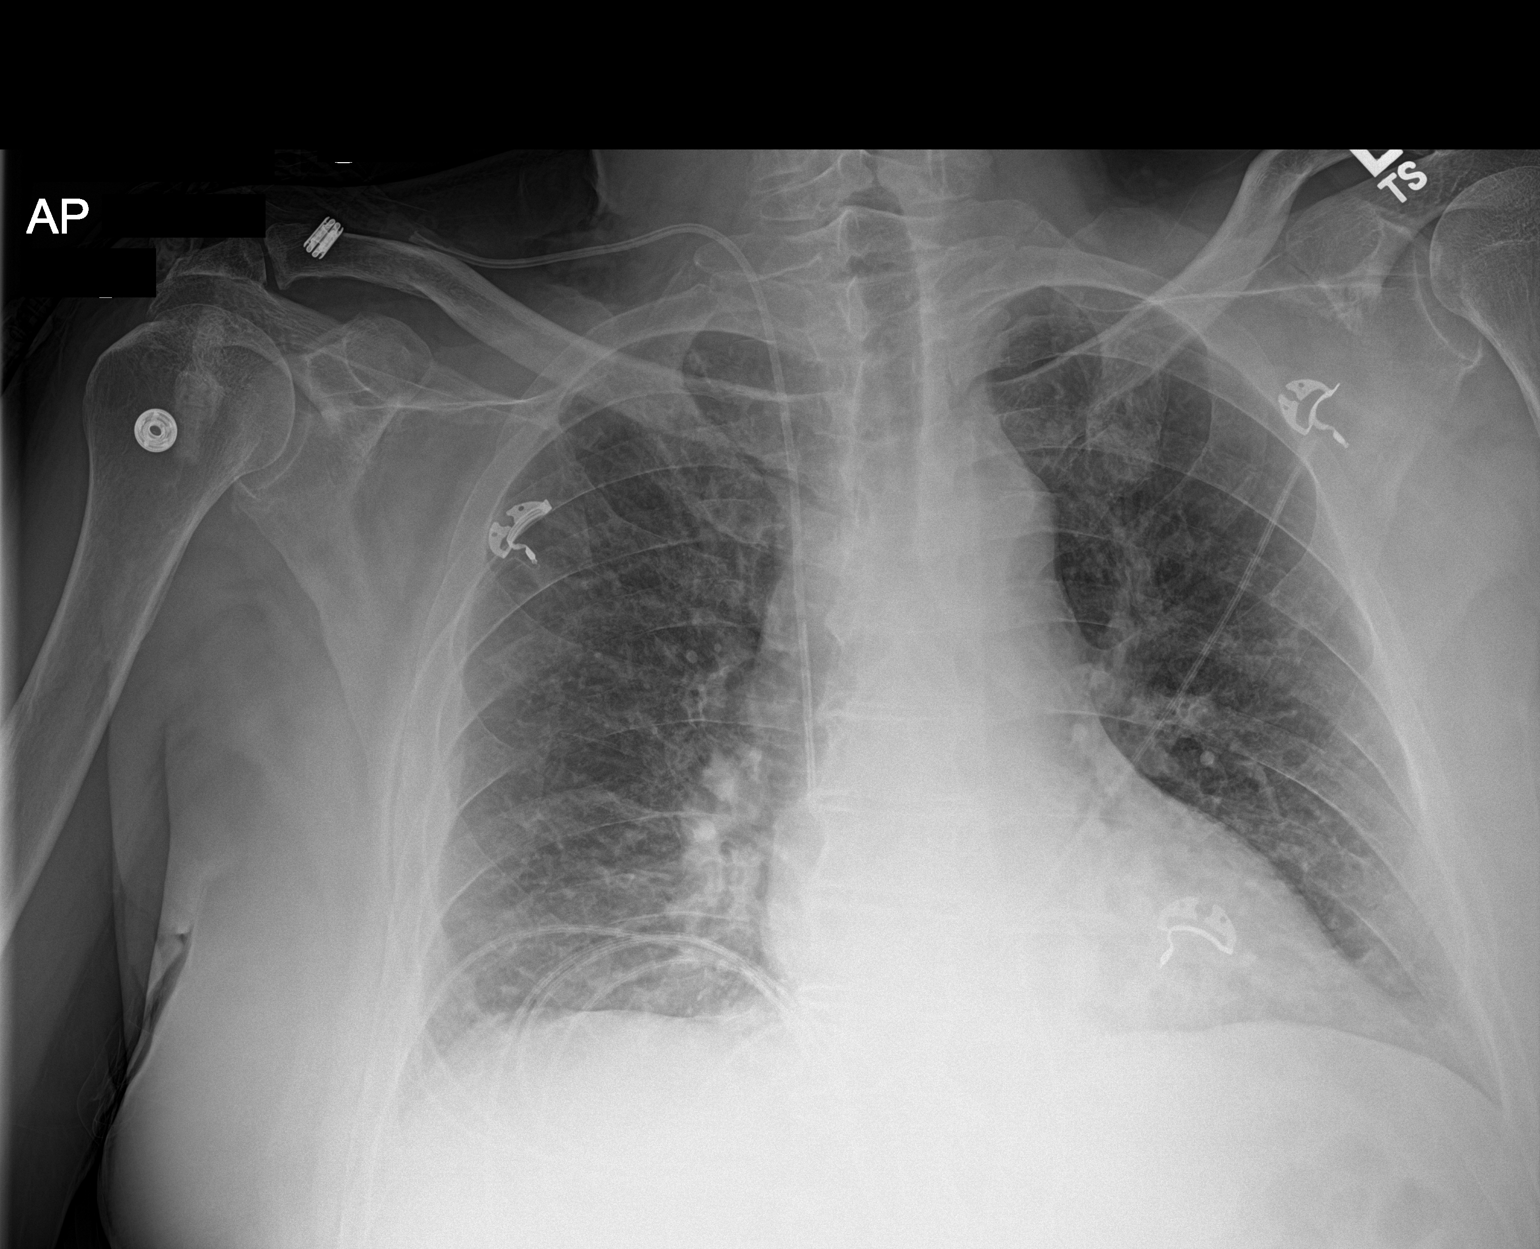

[1 of 1 positions shown; findings below may reference images not displayed]

FINDINGS: Right jugular catheter terminates over the lower SVC. The cardiac
silhouette is normal in size. There is improved aeration of the lung
bases with mild residual opacity bilaterally. Pleural effusions have
also decreased or resolved. No pneumothorax is identified.
IMPRESSION: Improved aeration of the lung bases with mild residual opacity,
likely atelectasis.

## 2018-05-10 IMAGING — CT CT ABD-PELV W/O CM
2 of 4 series · 16 of 46 positions shown, 18 images · non-contrast
Comparison: 11/06/2017

CLINICAL DATA: Left lower quadrant pain

EXAM:
CT ABDOMEN AND PELVIS WITHOUT CONTRAST
TECHNIQUE: Multidetector CT imaging of the abdomen and pelvis was performed
following the standard protocol without IV contrast.

[Series 2: routine abd/pel wo · axial · 0.98mm/px · z∈[-507,-57]mm · 13 of 98 slices shown, 15 images]
[im 4/98  soft-tissue]
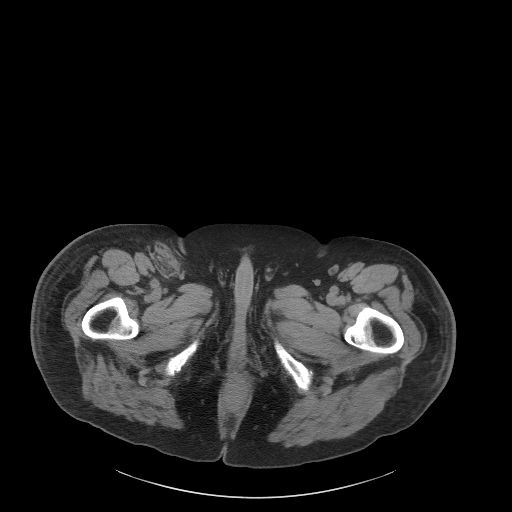
[im 4/98  bone]
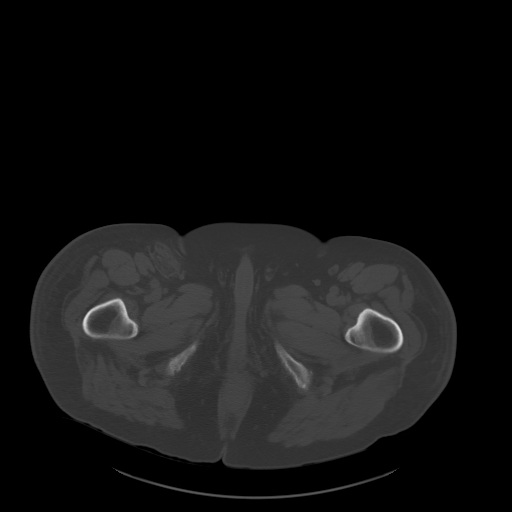
[im 12/98  soft-tissue]
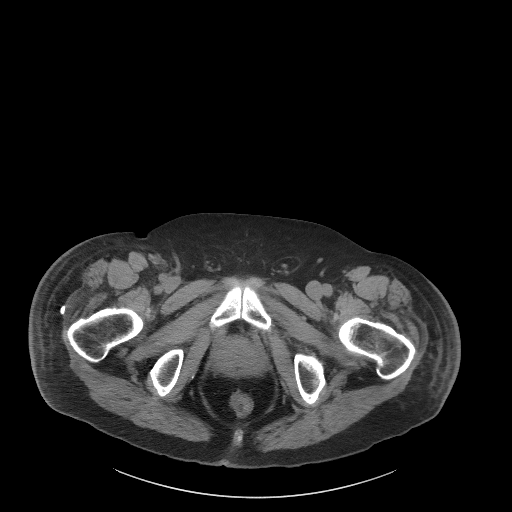
[im 20/98  soft-tissue]
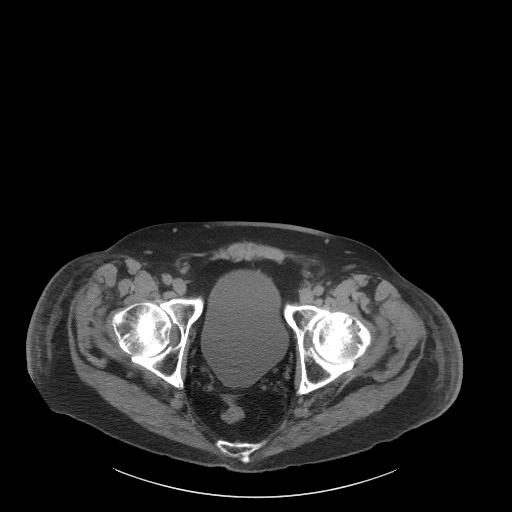
[im 28/98  soft-tissue]
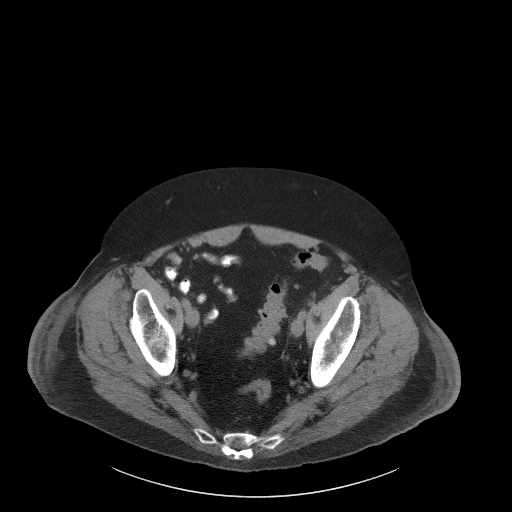
[im 35/98  soft-tissue]
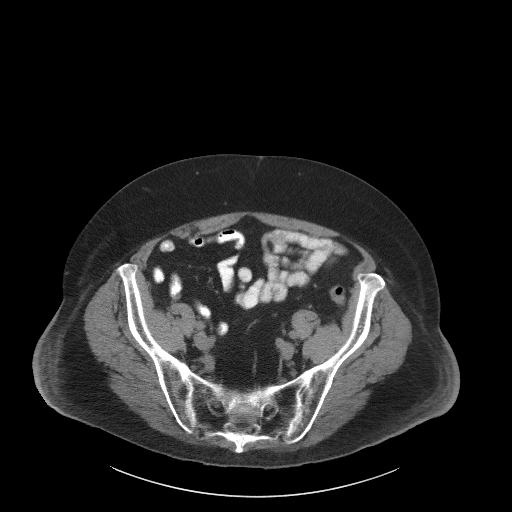
[im 43/98  soft-tissue]
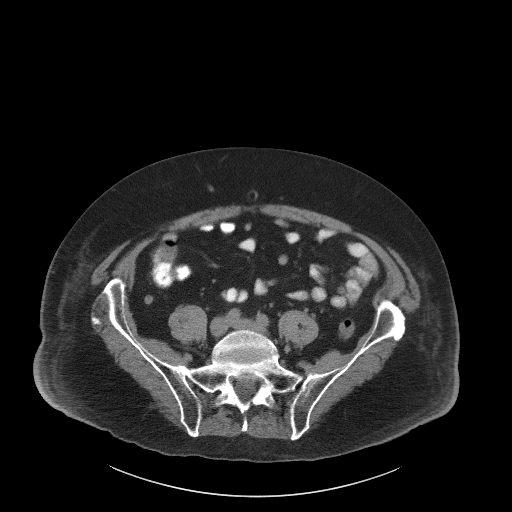
[im 51/98  soft-tissue]
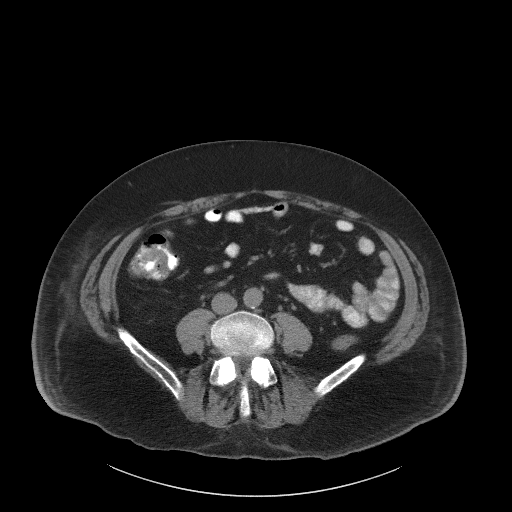
[im 55/98  soft-tissue]
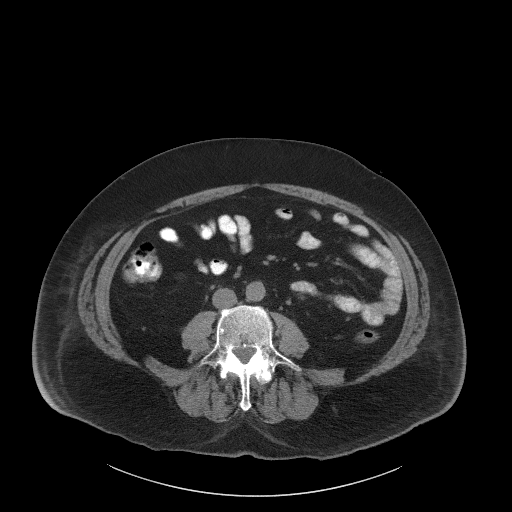
[im 63/98  soft-tissue]
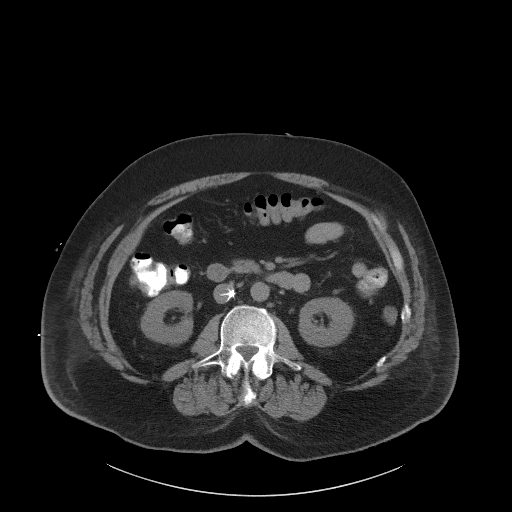
[im 63/98  bone]
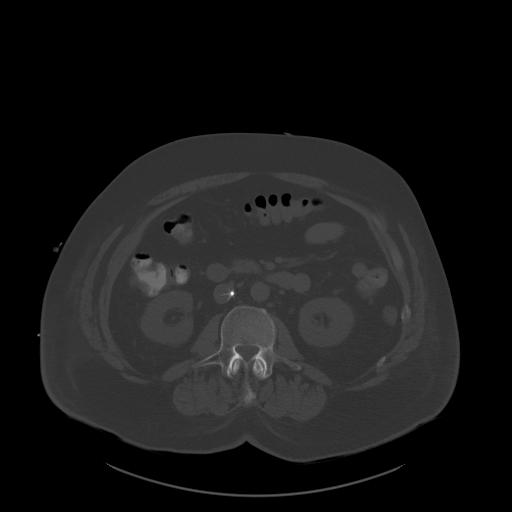
[im 70/98  soft-tissue]
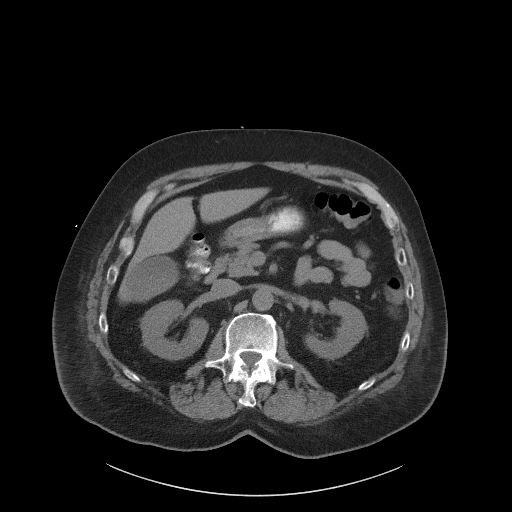
[im 78/98  soft-tissue]
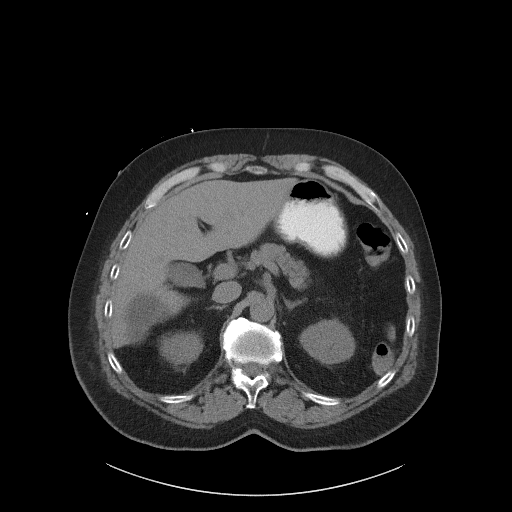
[im 86/98  soft-tissue]
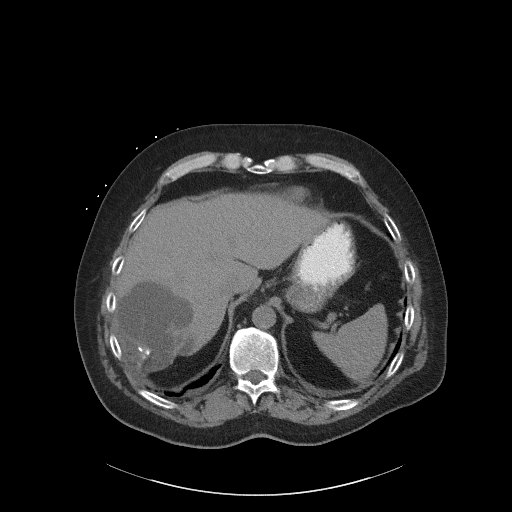
[im 94/98  soft-tissue]
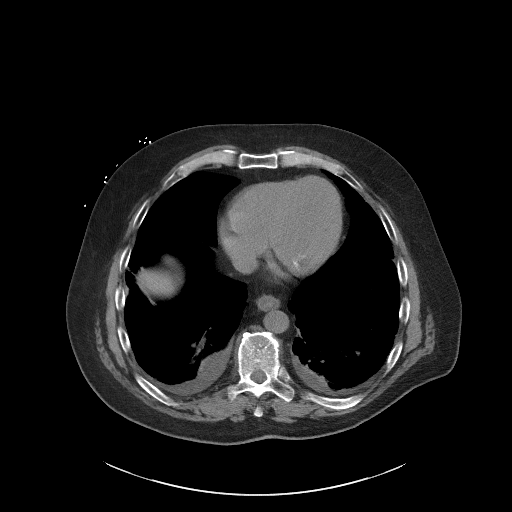

[Series 5: coronal st · coronal · 0.81mm/px · 3 of 116 slices shown]
[im 39/116  soft-tissue]
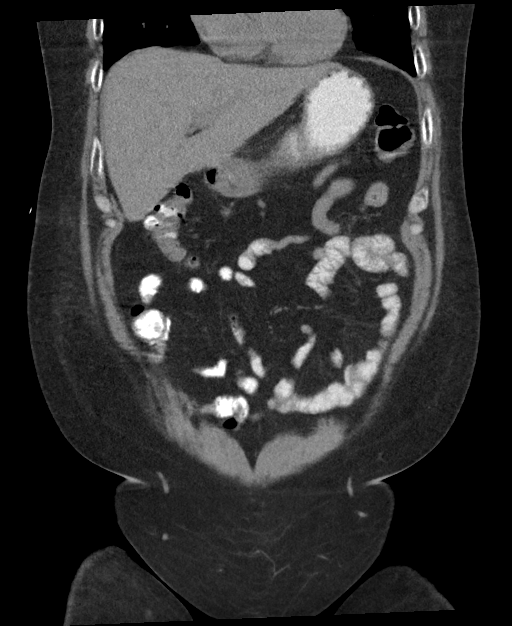
[im 52/116  soft-tissue]
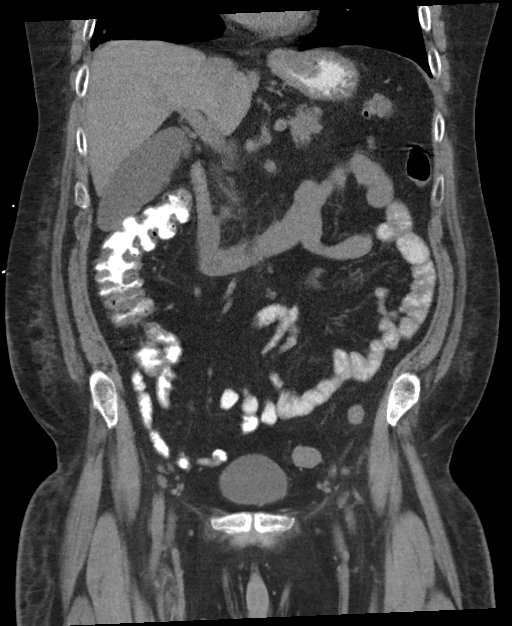
[im 64/116  soft-tissue]
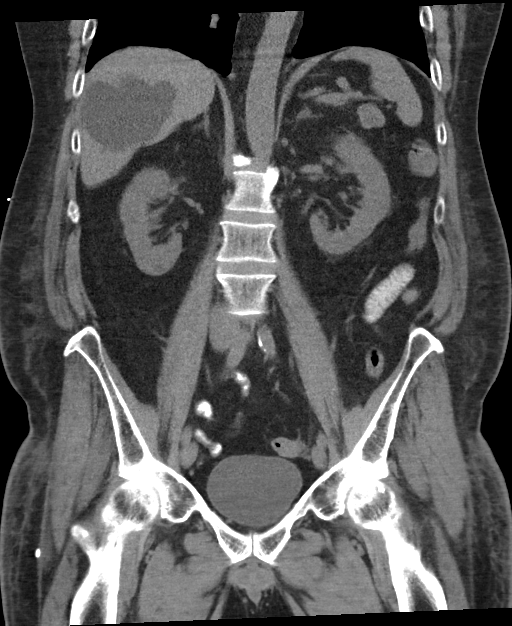

[16 of 46 positions shown; findings below may reference images not displayed]

FINDINGS: Lower chest: Small bilateral pleural effusions are noted. Bibasilar
atelectatic changes are again seen.

Hepatobiliary: Stable cystic changes are again seen in the posterior
aspect of the right lobe of the liver similar to that noted on prior
exam. The gallbladder is within normal limits.

Pancreas: Unremarkable. No pancreatic ductal dilatation or
surrounding inflammatory changes.

Spleen: Normal in size without focal abnormality.

Adrenals/Urinary Tract: The adrenal glands are within normal limits.
The kidneys are well visualized bilaterally without evidence of
renal calculi or urinary tract obstructive changes. The bladder is
well distended. A tiny focus of air is noted within the nondependent
portion of the bladder likely related to prior instrumentation.

Stomach/Bowel: Scattered diverticular change of the colon is noted
without evidence of diverticulitis. No obstructive or inflammatory
changes are seen. The appendix is within normal limits.

Vascular/Lymphatic: Aortic calcifications are noted. An IVC filter
is noted in stable position. No significant lymphadenopathy is
noted.

Reproductive: Prostate is unremarkable.

Other: No abdominal wall hernia or abnormality. No abdominopelvic
ascites. Soft tissue changes are noted in the right inguinal region
which may be related to recent vascular access.

Musculoskeletal: Degenerative changes of the lumbar spine are seen.
Stable compression deformity of L1 is noted.
IMPRESSION: Chronic changes as described above without acute abnormality.

Soft tissue changes in the right inguinal region likely related to
recent vascular access.

## 2018-05-12 IMAGING — CR DG CHEST 2V
1 series · 2 of 2 positions shown · non-contrast
Comparison: 02/28/2018.

CLINICAL DATA: Cough and congestion.

EXAM:
CHEST - 2 VIEW

[Series 1: dg chest 2 view · 0.14mm/px · 2 of 2 slices shown]
[im 1/2]
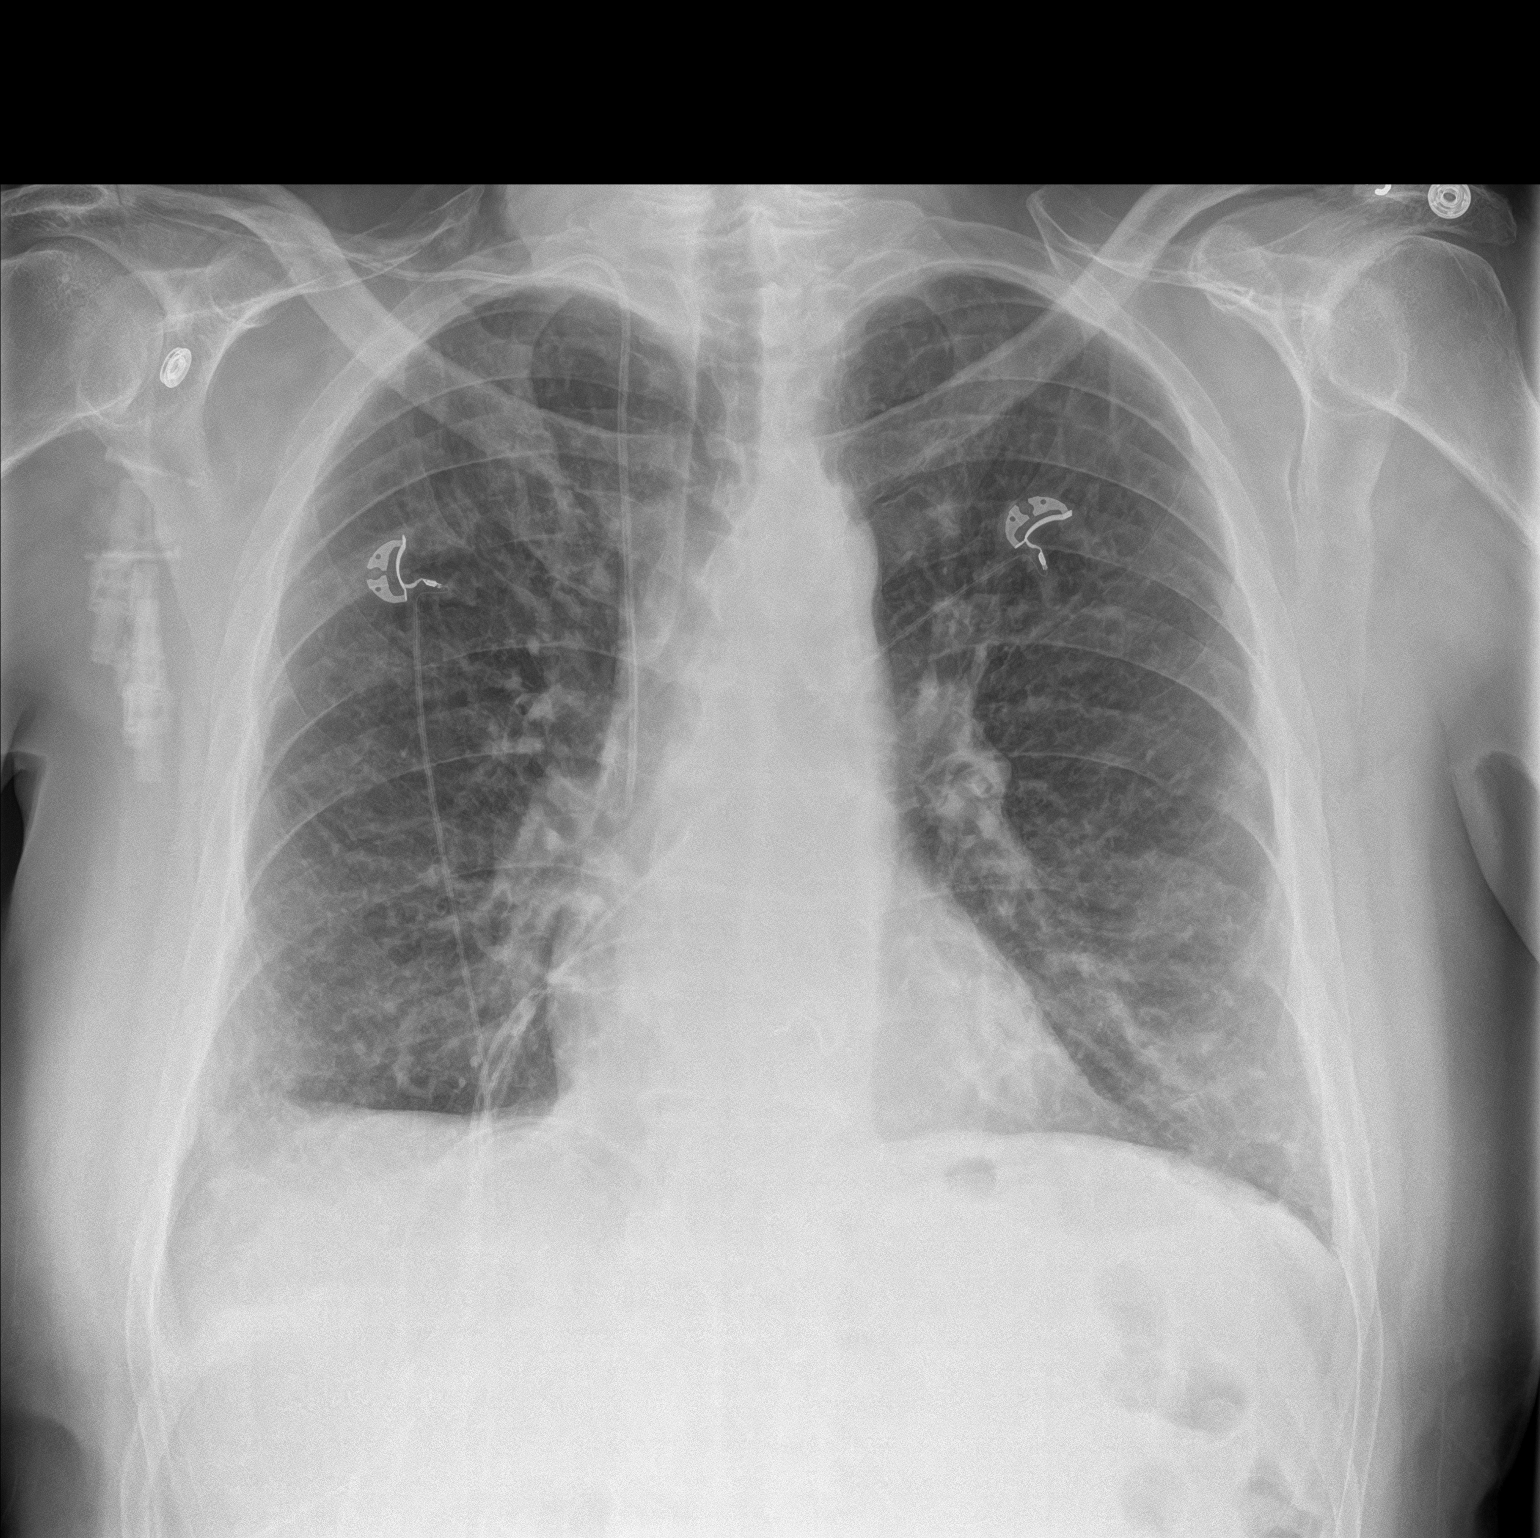
[im 2/2]
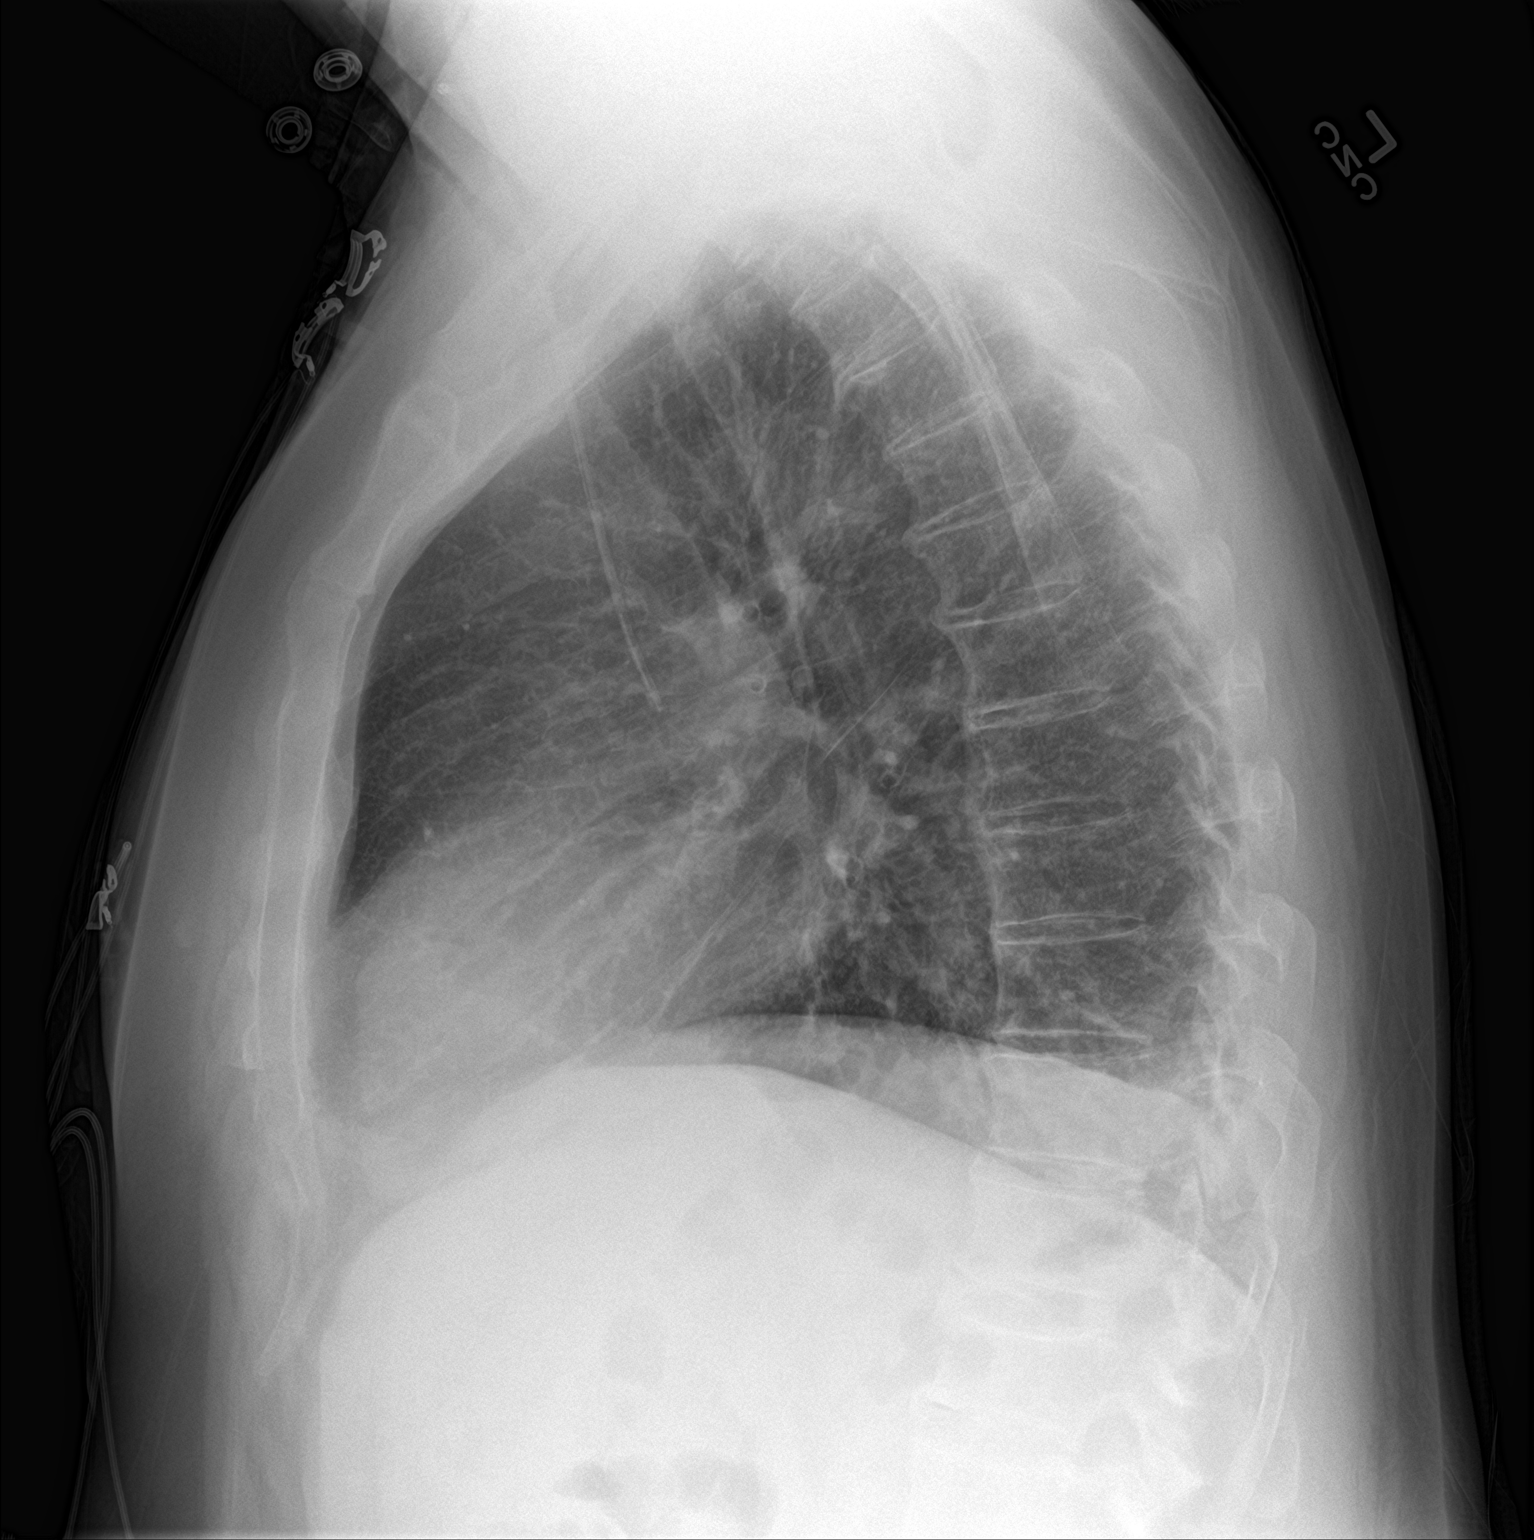

[2 of 2 positions shown; findings below may reference images not displayed]

FINDINGS: Right PICC line noted with tip in stable position at cavoatrial
junction. Heart size normal. Bibasilar subsegmental again noted.
Similar findings on prior exam. No prominent pleural effusion. No
pneumothorax.
IMPRESSION: 1. Right PICC line noted with tip at cavoatrial junction in stable
position.

2. Low lung volumes with mild bibasilar again noted. Similar
findings noted on prior exam.

## 2018-06-21 DIAGNOSIS — M3214 Glomerular disease in systemic lupus erythematosus: Secondary | ICD-10-CM | POA: Diagnosis not present

## 2018-06-21 DIAGNOSIS — N182 Chronic kidney disease, stage 2 (mild): Secondary | ICD-10-CM | POA: Diagnosis not present

## 2018-06-21 DIAGNOSIS — I10 Essential (primary) hypertension: Secondary | ICD-10-CM | POA: Diagnosis not present

## 2018-07-07 ENCOUNTER — Telehealth: Payer: Self-pay | Admitting: Family Medicine

## 2018-07-07 ENCOUNTER — Other Ambulatory Visit: Payer: Self-pay | Admitting: Family Medicine

## 2018-07-07 NOTE — Telephone Encounter (Signed)
Copied from [place] [id number]. Topic: General - Other >> Jul 07, 2018 11:44 AM Lennox Solders wrote: Reason for CRM: pt is calling and needs new rx eliquis from dr lada . This med was prescribed by his oncologist. [facility]us in [place] is out of medication

## 2018-07-08 ENCOUNTER — Telehealth: Payer: Self-pay | Admitting: Family Medicine

## 2018-07-08 MED ORDER — APIXABAN 5 MG PO TABS: 5.0000 mg | ORAL_TABLET | Freq: Two times a day (BID) | ORAL | 2 refills | Status: DC

## 2018-07-08 NOTE — Telephone Encounter (Signed)
Copied from [place] [id number]. Topic: Quick Communication - See Telephone Encounter >> Jul 08, 2018  9:44 AM Cathrine Muster, CMA wrote: CRM for notification. See Telephone encounter for: 07/08/18. Please have him call his hematology-oncology doctor if that's who has been prescribing it; thank you >> Jul 08, 2018  3:34 PM Percell Belt A wrote: Pt called in and stated that he no longer has a oncologist.  Pt stated he has been on this med for years.  Pt stated that he has not seen him in a long time.  Pt is not certain who was actually refilling this med for him?    Pharmacy - [facility] on [address]    >> Jul 08, 2018  3:48 PM Keene Breath wrote: Patient is calling again to get his blood thinner refilled.  He stated that he needs to take this or he can be in crisis.  Please advise.  CB# [phone].

## 2018-07-08 NOTE — Telephone Encounter (Signed)
Left detailed voicemail

## 2018-07-08 NOTE — Telephone Encounter (Signed)
I read through prior info; looks like he was dismissed from the cancer center  Last serum cr <1.5, age <39, weight >60 kg Okay for 5 mg BID Please ask patient to schedule an appt here, visit and labs Thank you

## 2018-07-08 NOTE — Telephone Encounter (Signed)
Please have him call his hematology-oncology doctor if that's who has been prescribing it; thank you

## 2018-07-09 NOTE — Telephone Encounter (Signed)
Called [phone] @ 12:28 left voice message informing pt that Dr Sanda Klein would like for pt to come in to be seen.

## 2018-07-28 ENCOUNTER — Ambulatory Visit: Payer: 59 | Admitting: Family Medicine

## 2018-07-28 ENCOUNTER — Encounter: Payer: Self-pay | Admitting: Family Medicine

## 2018-07-28 VITALS — BP 142/98 | HR 93 | Temp 98.3°F | Resp 16 | Ht 74.0 in | Wt 245.9 lb

## 2018-07-28 DIAGNOSIS — Z5181 Encounter for therapeutic drug level monitoring: Secondary | ICD-10-CM

## 2018-07-28 DIAGNOSIS — R739 Hyperglycemia, unspecified: Secondary | ICD-10-CM | POA: Diagnosis not present

## 2018-07-28 DIAGNOSIS — R29898 Other symptoms and signs involving the musculoskeletal system: Secondary | ICD-10-CM

## 2018-07-28 DIAGNOSIS — R635 Abnormal weight gain: Secondary | ICD-10-CM | POA: Diagnosis not present

## 2018-07-28 DIAGNOSIS — M3214 Glomerular disease in systemic lupus erythematosus: Secondary | ICD-10-CM | POA: Diagnosis not present

## 2018-07-28 DIAGNOSIS — M79604 Pain in right leg: Secondary | ICD-10-CM

## 2018-07-28 DIAGNOSIS — E7800 Pure hypercholesterolemia, unspecified: Secondary | ICD-10-CM | POA: Diagnosis not present

## 2018-07-28 DIAGNOSIS — R52 Pain, unspecified: Secondary | ICD-10-CM

## 2018-07-28 DIAGNOSIS — I10 Essential (primary) hypertension: Secondary | ICD-10-CM

## 2018-07-28 DIAGNOSIS — M329 Systemic lupus erythematosus, unspecified: Secondary | ICD-10-CM

## 2018-07-28 DIAGNOSIS — D485 Neoplasm of uncertain behavior of skin: Secondary | ICD-10-CM | POA: Diagnosis not present

## 2018-07-28 DIAGNOSIS — G8929 Other chronic pain: Secondary | ICD-10-CM

## 2018-07-28 DIAGNOSIS — M5441 Lumbago with sciatica, right side: Secondary | ICD-10-CM

## 2018-07-28 DIAGNOSIS — Q446 Cystic disease of liver: Secondary | ICD-10-CM

## 2018-07-28 DIAGNOSIS — Z1159 Encounter for screening for other viral diseases: Secondary | ICD-10-CM | POA: Diagnosis not present

## 2018-07-28 DIAGNOSIS — Z125 Encounter for screening for malignant neoplasm of prostate: Secondary | ICD-10-CM

## 2018-07-28 DIAGNOSIS — M79605 Pain in left leg: Secondary | ICD-10-CM

## 2018-07-28 DIAGNOSIS — M5442 Lumbago with sciatica, left side: Secondary | ICD-10-CM

## 2018-07-28 DIAGNOSIS — E669 Obesity, unspecified: Secondary | ICD-10-CM

## 2018-07-28 MED ORDER — AMLODIPINE BESYLATE 5 MG PO TABS: 5.0000 mg | ORAL_TABLET | Freq: Every day | ORAL | 3 refills | Status: DC

## 2018-07-28 NOTE — Assessment & Plan Note (Signed)
Follow up with specialist. 

## 2018-07-28 NOTE — Assessment & Plan Note (Signed)
High cholesterol noted in chart; check lipid panel

## 2018-07-28 NOTE — Assessment & Plan Note (Signed)
Check glucose and A1c 

## 2018-07-28 NOTE — Progress Notes (Signed)
BP (!) 142/98   Pulse 93   Temp 98.3 F (36.8 C) (Oral)   Resp 16   Ht 6\' 2"  (1.88 m)   Wt 245 lb 14.4 oz (111.5 kg)   SpO2 98%   BMI 31.57 kg/m    Subjective:    Patient ID: Jonathon Snow, male    DOB: [birth year], 57 y.o.   MRN: [id number]  HPI: Jonathon Snow is a 57 y.o. male  Chief Complaint  Patient presents with  . Follow-up  . Leg Pain    Bilateral leg pain that is constant and aching and has been bothering since he was evaluated at ED on 03/20/2018  . skin lesion    x 2 weeks that is on his face and itches and has grown in size.    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  He has high cholesterol noted in his chart, as well as hyperglycemia; never took chol med; does run in the family; eats some hamburgers; not much bacon and sausage; likes cheese; not sure about any diabetes in the family  His blood pressure is high today; on 2.5 mg amlo, 100 mg losartan  He had a cyst on the liver years ago  Depression screen [facility] 2/9 07/28/2018 03/16/2018 02/02/2018 12/31/2017 08/06/2017  Decreased Interest 0 0 3 3 0  Down, Depressed, Hopeless 0 0 3 3 0  PHQ - 2 Score 0 0 6 6 0  Altered sleeping - - 3 3 -  Tired, decreased energy - - 3 3 -  Change in appetite - - 3 2 -  Feeling bad or failure about yourself  - - 3 3 -  Trouble concentrating - - 3 3 -  Moving slowly or fidgety/restless - - 3 3 -  Suicidal thoughts - - 0 (No Data) -    PHQ-9 Score - - 24 23 -  Difficult doing work/chores - - Extremely dIfficult Extremely dIfficult -    Relevant past medical, surgical, family and social history reviewed Past Medical History:  Diagnosis Date  . Depression   . DVT (deep venous thrombosis) ([place])   . GERD (gastroesophageal reflux disease)   . Hyperlipidemia   . Hypertension   . Lupus ([place])   . Pulmonary embolism ([place])   . Renal disorder    Stage III   Past Surgical History:  Procedure Laterality Date  . ANKLE SURGERY Right   . CYST EXCISION  92 or 93    Liver cyst removal [facility]  . I&D EXTREMITY Right 04/29/2017   Procedure: IRRIGATION AND DEBRIDEMENT EXTREMITY;  Surgeon: Clayburn Pert, MD;  Location: [facility] ORS;  Service: General;  Laterality: Right;  . IRRIGATION AND DEBRIDEMENT ABSCESS Left 04/29/2017   Procedure: IRRIGATION AND DEBRIDEMENT Scrotal ABSCESS;  Surgeon: Clayburn Pert, MD;  Location: [facility] ORS;  Service: General;  Laterality: Left;  Family History  Problem Relation Age of Onset  . Hypertension Father   . Heart disease Father   . Clotting disorder Mother   . Kidney disease Brother   . Heart attack Maternal Grandmother   . Heart attack Maternal Grandfather   . Heart attack Paternal Grandfather    Social History   Tobacco Use  . Smoking status: Never Smoker  . Smokeless tobacco: Never Used  Substance Use Topics  . Alcohol use: No  . Drug use: No    Interim medical history since last visit reviewed. Allergies and medications reviewed  Review of Systems Per HPI unless specifically indicated above     Objective:    BP (!) 142/98   Pulse 93   Temp 98.3 F (36.8 C) (Oral)   Resp 16   Ht 6\' 2"  (1.88 m)   Wt 245 lb 14.4 oz (111.5 kg)   SpO2 98%   BMI 31.57 kg/m   Wt Readings from Last 3 Encounters:  07/28/18 245 lb 14.4 oz (111.5 kg)  03/29/18 229 lb 14.4 oz (104.3 kg)  03/20/18 224 lb (101.6 kg)    Physical Exam  Constitutional: He appears well-developed and well-nourished.  No distress.  Weight gain 16 pounds over last 4 months; obese  HENT:  Head: Normocephalic and atraumatic.  Eyes: EOM are normal. No scleral icterus.  Neck: No thyromegaly present.  Cardiovascular: Normal rate and regular rhythm.  Pulmonary/Chest: Effort normal and breath sounds normal.  Abdominal: Soft. Bowel sounds are normal. He exhibits no distension.  Musculoskeletal: He exhibits no edema.       Lumbar back: He exhibits decreased range of motion and tenderness. He exhibits no bony tenderness and no deformity.  Neurological: Coordination normal.  LE strength 5/5  Skin: Skin is warm and dry. Lesion (flesh-colored papule on the lateral LEFT cheek; no telangiectasia; rather keratotic or verrucous surface) noted. No pallor.  Psychiatric: He has a normal mood and affect. His behavior is normal. Judgment and thought content normal.    Results for orders placed or performed during the hospital encounter of 03/17/18  Urine culture  Result Value Ref Range   Specimen Description      URINE, RANDOM Performed at [facility], [address] [place], [place] [address]    Special Requests      NONE Performed at [facility], [address], [place], [place] [address]    Culture      NO GROWTH Performed at [facility], [place] [address] [place], [place] [address]    Report Status 03/19/2018 FINAL   Blood culture (routine x 2)  Result Value Ref Range   Specimen Description BLOOD LEFT ANTECUBITAL    Special Requests      BOTTLES DRAWN AEROBIC AND ANAEROBIC Blood Culture results may not be optimal due to an excessive volume of blood received in culture bottles   Culture      NO GROWTH 5 DAYS Performed at [facility], [address] [place], [place] [address]    Report Status 03/22/2018 FINAL   Blood culture (routine x 2)  Result Value Ref Range   Specimen Description BLOOD RIGHT ANTECUBITAL    Special Requests      BOTTLES DRAWN AEROBIC AND  ANAEROBIC Blood Culture results may not be optimal due to an excessive volume of blood received in culture bottles   Culture      NO GROWTH 5 DAYS Performed at [facility], [address] [place], [place] [address]    Report Status 03/22/2018 FINAL   Lipase, blood  Result Value Ref Range   Lipase 85 (H) 11 - 51 U/L  Comprehensive metabolic panel  Result Value Ref Range   Sodium 138 135 - 145 mmol/L   Potassium 4.2 3.5 - 5.1 mmol/L   Chloride 106 101 - 111 mmol/L   CO2 23 22 - 32 mmol/L   Glucose, Bld 99 65 - 99 mg/dL   BUN 21 (H) 6 - 20 mg/dL   Creatinine, Ser 1.89 (H) 0.61 - 1.24 mg/dL   Calcium 8.6 (L) 8.9 - 10.3 mg/dL   Total Protein 7.6 6.5 - 8.1 g/dL   Albumin 3.2 (L) 3.5 - 5.0 g/dL   AST 23 15 - 41 U/L   ALT 21 17 - 63 U/L   Alkaline Phosphatase 112 38 - 126 U/L   Total Bilirubin 0.4 0.3 - 1.2 mg/dL   GFR calc non Af Amer 38 (L) >60 mL/min   GFR calc Af Amer 44 (L) >60 mL/min   Anion gap 9 5 - 15  CBC  Result Value Ref Range   WBC 4.0 3.8 - 10.6 K/uL   RBC 4.64 4.40 - 5.90 MIL/uL   Hemoglobin 13.4 13.0 - 18.0 g/dL   HCT 40.4 40.0 - 52.0 %   MCV 87.2 80.0 - 100.0 fL   MCH 29.0 26.0 - 34.0 pg   MCHC 33.2 32.0 - 36.0 g/dL   RDW 15.2 (H) 11.5 - 14.5 %   Platelets 214 150 - 440 K/uL  Urinalysis, Complete w Microscopic  Result Value Ref Range   Color, Urine YELLOW (A) YELLOW   APPearance HAZY (A) CLEAR   Specific Gravity, Urine 1.013 1.005 - 1.030   pH 5.0 5.0 - 8.0   Glucose, UA NEGATIVE NEGATIVE mg/dL   Hgb urine dipstick LARGE (A) NEGATIVE   Bilirubin Urine NEGATIVE NEGATIVE   Ketones, ur NEGATIVE NEGATIVE mg/dL   Protein, ur NEGATIVE NEGATIVE mg/dL   Nitrite NEGATIVE NEGATIVE   Leukocytes, UA NEGATIVE NEGATIVE   RBC / HPF TOO NUMEROUS TO COUNT 0 - 5 RBC/hpf   WBC, UA 0-5 0 - 5 WBC/hpf   Bacteria, UA RARE (A) NONE SEEN   Squamous Epithelial / LPF 0-5 (A) NONE SEEN   Mucus PRESENT    Hyaline Casts, UA PRESENT   Influenza panel by PCR (type A &  B)  Result Value Ref Range   Influenza A By PCR NEGATIVE NEGATIVE   Influenza B By PCR NEGATIVE NEGATIVE  Protime-INR  Result Value Ref Range   Prothrombin Time 13.9 11.4 - 15.2 seconds   INR 1.08   APTT  Result Value Ref Range   aPTT 31 24 - 36 seconds  Basic metabolic panel  Result Value Ref Range   Sodium 140 135 - 145 mmol/L   Potassium 3.6 3.5 - 5.1 mmol/L   Chloride 109 101 - 111 mmol/L   CO2 22 22 - 32 mmol/L   Glucose, Bld 107 (H) 65 - 99 mg/dL   BUN 22 (H) 6 - 20 mg/dL   Creatinine, Ser 1.68 (H) 0.61 - 1.24 mg/dL   Calcium 8.0 (L) 8.9 - 10.3 mg/dL   GFR calc non Af Amer 44 (L) >60 mL/min   GFR calc Af Amer 51 (L) >60 mL/min   Anion gap 9 5 - 15  CBC  Result Value Ref Range   WBC 3.5 (L) 3.8 - 10.6 K/uL   RBC 4.05 (  L) 4.40 - 5.90 MIL/uL   Hemoglobin 11.8 (L) 13.0 - 18.0 g/dL   HCT 34.9 (L) 40.0 - 52.0 %   MCV 86.1 80.0 - 100.0 fL   MCH 29.2 26.0 - 34.0 pg   MCHC 33.9 32.0 - 36.0 g/dL   RDW 14.8 (H) 11.5 - 14.5 %   Platelets 170 150 - 440 K/uL  Glucose, capillary  Result Value Ref Range   Glucose-Capillary 76 65 - 99 mg/dL   Comment 1 Notify RN   Basic metabolic panel  Result Value Ref Range   Sodium 138 135 - 145 mmol/L   Potassium 3.5 3.5 - 5.1 mmol/L   Chloride 110 101 - 111 mmol/L   CO2 20 (L) 22 - 32 mmol/L   Glucose, Bld 109 (H) 65 - 99 mg/dL   BUN 28 (H) 6 - 20 mg/dL   Creatinine, Ser 1.47 (H) 0.61 - 1.24 mg/dL   Calcium 8.3 (L) 8.9 - 10.3 mg/dL   GFR calc non Af Amer 52 (L) >60 mL/min   GFR calc Af Amer 60 (L) >60 mL/min   Anion gap 8 5 - 15  CBC  Result Value Ref Range   WBC 4.1 3.8 - 10.6 K/uL   RBC 4.11 (L) 4.40 - 5.90 MIL/uL   Hemoglobin 11.6 (L) 13.0 - 18.0 g/dL   HCT 36.0 (L) 40.0 - 52.0 %   MCV 87.6 80.0 - 100.0 fL   MCH 28.3 26.0 - 34.0 pg   MCHC 32.3 32.0 - 36.0 g/dL   RDW 14.8 (H) 11.5 - 14.5 %   Platelets 171 150 - 440 K/uL  Glucose, capillary  Result Value Ref Range   Glucose-Capillary 91 65 - 99 mg/dL  Type and screen  [place]  Result Value Ref Range   ABO/RH(D) A POS    Antibody Screen NEG    Sample Expiration      03/20/2018 Performed at [facility], [address], [place], [place] [address]       Assessment & Plan:   Problem List Items Addressed This Visit      Cardiovascular and Mediastinum   Essential hypertension, benign    Encouraged DASH guidelines      Relevant Medications   amLODipine (NORVASC) 5 MG tablet     Digestive   Congenital cystic disease of liver    Single large cyst on CT scan, stable per radiologist        Genitourinary   Lupus nephritis ([place])    Continue to f/u with nephrologist        Other   Obesity (BMI 30.0-34.9)    Encouraged weight loss      Hyperlipidemia    High cholesterol noted in chart; check lipid panel      Relevant Medications   amLODipine (NORVASC) 5 MG tablet   Other Relevant Orders   Lipid panel   Hyperglycemia    Check glucose and A1c      Relevant Orders   Hemoglobin A1c   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[place])    Follow-up with specialist       Other Visit Diagnoses    Neoplasm of uncertain behavior of skin of face    -  Primary   Relevant Orders   Ambulatory referral to Dermatology   Abnormal weight gain       weight gain of 16 pounds over last 4 months   Relevant Orders   TSH   Leg pain, bilateral  since March 2019   Leg weakness, bilateral       Relevant Orders   Ambulatory referral to Orthopedic Surgery   Pain aggravated by prolonged bedrest       Relevant Orders   Ambulatory referral to Orthopedic Surgery   Chronic bilateral low back pain with bilateral sciatica       Relevant Orders   Ambulatory referral to Orthopedic Surgery   Pain in both lower extremities       Relevant Orders   Ambulatory referral to Orthopedic Surgery   Medication monitoring encounter       Relevant Orders   CBC with Differential/Platelet   COMPLETE METABOLIC PANEL WITH GFR   Need for  hepatitis C screening test       Relevant Orders   Hepatitis C antibody   Screening for prostate cancer       Relevant Orders   PSA       Follow up plan: Return in about 3 months (around 10/28/2018) for follow-up visit with Dr. Sanda Klein; BP check only with Roselyn Reef 2 weeks.  An after-visit summary was printed and given to the patient at [facility].  Please see the patient instructions which may contain other information and recommendations beyond what is mentioned above in the assessment and plan.  Meds ordered this encounter  Medications  . amLODipine (NORVASC) 5 MG tablet    Sig: Take 1 tablet (5 mg total) by mouth daily.    Dispense:  90 tablet    Refill:  3    Orders Placed This Encounter  Procedures  . CBC with Differential/Platelet  . COMPLETE METABOLIC PANEL WITH GFR  . Hemoglobin A1c  . Lipid panel  . TSH  . PSA  . Hepatitis C antibody  . Ambulatory referral to Dermatology  . Ambulatory referral to Orthopedic Surgery

## 2018-07-28 NOTE — Assessment & Plan Note (Signed)
Encouraged DASH guidelines 

## 2018-07-28 NOTE — Assessment & Plan Note (Signed)
Continue to f/u with nephrologist

## 2018-07-28 NOTE — Patient Instructions (Addendum)
We'll get labs today I've put in the referral for you to see the dermatologist  If you have not heard anything from my staff in a week about any orders/referrals/studies from today, please contact us here to follow-up [phone]  Check out the information at [email or web address].[email or web address] entitled "Nutrition for Weight Loss: What You Need to Know about Fad Diets" Try to lose between 1-2 pounds per week by taking in fewer calories and burning off more calories You can succeed by limiting portions, limiting foods dense in calories and fat, becoming more active, and drinking 8 glasses of water a day (64 ounces) Don't skip meals, especially breakfast, as skipping meals may alter your metabolism Do not use over-the-counter weight loss pills or gimmicks that claim rapid weight loss A healthy BMI (or body mass index) is between 18.5 and 24.9 You can calculate your ideal BMI at the [place] website ClubMonetize.fr  Try to follow the DASH guidelines (DASH stands for Dietary Approaches to Stop Hypertension). Try to limit the sodium in your diet to no more than 1,500mg  of sodium per day. Certainly try to not exceed 2,000 mg per day at the very most. Do not add salt when cooking or at the table.  Check the sodium amount on labels when shopping, and choose items lower in sodium when given a choice. Avoid or limit foods that already contain a lot of sodium. Eat a diet rich in fruits and vegetables and whole grains, and try to lose weight if overweight or obese   Obesity, Adult Obesity is the condition of having too much total body fat. Being overweight or obese means that your weight is greater than what is considered healthy for your body size. Obesity is determined by a measurement called BMI. BMI is an estimate of body fat and is calculated from height and weight. For adults, a BMI of 30 or higher is considered obese. Obesity can eventually lead to other health concerns  and major illnesses, including:  Stroke.  Coronary artery disease (CAD).  Type 2 diabetes.  Some types of cancer, including cancers of the colon, breast, uterus, and gallbladder.  Osteoarthritis.  High blood pressure (hypertension).  High cholesterol.  Sleep apnea.  Gallbladder stones.  Infertility problems.  What are the causes? The main cause of obesity is taking in (consuming) more calories than your body uses for energy. Other factors that contribute to this condition may include:  Being born with genes that make you more likely to become obese.  Having a medical condition that causes obesity. These conditions include: ? Hypothyroidism. ? Polycystic ovarian syndrome (PCOS). ? Binge-eating disorder. ? Cushing syndrome.  Taking certain medicines, such as steroids, antidepressants, and seizure medicines.  Not being physically active (sedentary lifestyle).  Living where there are limited places to exercise safely or buy healthy foods.  Not getting enough sleep.  What increases the risk? The following factors may increase your risk of this condition:  Having a family history of obesity.  Being a woman of African-American descent.  Being a man of Hispanic descent.  What are the signs or symptoms? Having excessive body fat is the main symptom of this condition. How is this diagnosed? This condition may be diagnosed based on:  Your symptoms.  Your medical history.  A physical exam. Your health care provider may measure: ? Your BMI. If you are an adult with a BMI between 25 and less than 30, you are considered overweight. If you are an adult with a BMI of  30 or higher, you are considered obese. ? The distances around your hips and your waist (circumferences). These may be compared to each other to help diagnose your condition. ? Your skinfold thickness. Your health care provider may gently pinch a fold of your skin and measure it.  How is this  treated? Treatment for this condition often includes changing your lifestyle. Treatment may include some or all of the following:  Dietary changes. Work with your health care provider and a dietitian to set a weight-loss goal that is healthy and reasonable for you. Dietary changes may include eating: ? Smaller portions. A portion size is the amount of a particular food that is healthy for you to eat at one time. This varies from person to person. ? Low-calorie or low-fat options. ? More whole grains, fruits, and vegetables.  Regular physical activity. This may include aerobic activity (cardio) and strength training.  Medicine to help you lose weight. Your health care provider may prescribe medicine if you are unable to lose 1 pound a week after 6 weeks of eating more healthily and doing more physical activity.  Surgery. Surgical options may include gastric banding and gastric bypass. Surgery may be done if: ? Other treatments have not helped to improve your condition. ? You have a BMI of 40 or higher. ? You have life-threatening health problems related to obesity.  Follow these instructions at home:  Eating and drinking   Follow recommendations from your health care provider about what you eat and drink. Your health care provider may advise you to: ? Limit fast foods, sweets, and processed snack foods. ? Choose low-fat options, such as low-fat milk instead of whole milk. ? Eat 5 or more servings of fruits or vegetables every day. ? Eat at home more often. This gives you more control over what you eat. ? Choose healthy foods when you eat out. ? Learn what a healthy portion size is. ? Keep low-fat snacks on hand. ? Avoid sugary drinks, such as soda, fruit juice, iced tea sweetened with sugar, and flavored milk. ? Eat a healthy breakfast.  Drink enough water to keep your urine clear or pale yellow.  Do not go without eating for long periods of time (do not fast) or follow a fad diet.  Fasting and fad diets can be unhealthy and even dangerous. Physical Activity  Exercise regularly, as told by your health care provider. Ask your health care provider what types of exercise are safe for you and how often you should exercise.  Warm up and stretch before being active.  Cool down and stretch after being active.  Rest between periods of activity. Lifestyle  Limit the time that you spend in front of your TV, computer, or video game system.  Find ways to reward yourself that do not involve food.  Limit alcohol intake to no more than 1 drink a day for nonpregnant women and 2 drinks a day for men. One drink equals 12 oz of beer, 5 oz of wine, or 1 oz of hard liquor. General instructions  Keep a weight loss journal to keep track of the food you eat and how much you exercise you get.  Take over-the-counter and prescription medicines only as told by your health care provider.  Take vitamins and supplements only as told by your health care provider.  Consider joining a support group. Your health care provider may be able to recommend a support group.  Keep all follow-up visits as told by your  health care provider. This is important. Contact a health care provider if:  You are unable to meet your weight loss goal after 6 weeks of dietary and lifestyle changes. This information is not intended to replace advice given to you by your health care provider. Make sure you discuss any questions you have with your health care provider. Document Released: 01/15/2005 Document Revised: 05/12/2016 Document Reviewed: 09/26/2015 Elsevier Interactive Patient Education  [employer].  If you need something for aches or pains, try to use Tylenol (acetaminophen) instead of non-steroidals (which include Aleve, ibuprofen, Advil, Motrin, and naproxen); non-steroidals can cause long-term kidney damage Try turmeric as a natural anti-inflammatory (for pain and arthritis). It comes in capsules  where you buy aspirin and fish oil, but also as a spice where you buy pepper and garlic powder.

## 2018-07-28 NOTE — Assessment & Plan Note (Signed)
Encouraged weight loss 

## 2018-07-28 NOTE — Assessment & Plan Note (Signed)
Single large cyst on CT scan, stable per radiologist

## 2018-07-29 LAB — CBC WITH DIFFERENTIAL/PLATELET
BASOS PCT: 0.6 %
Basophils Absolute: 32 cells/uL (ref 0–200)
Eosinophils Absolute: 108 cells/uL (ref 15–500)
Eosinophils Relative: 2 %
HCT: 44.8 % (ref 38.5–50.0)
Hemoglobin: 14.6 g/dL (ref 13.2–17.1)
Lymphs Abs: 1156 cells/uL (ref 850–3900)
MCH: 28 pg (ref 27.0–33.0)
MCHC: 32.6 g/dL (ref 32.0–36.0)
MCV: 86 fL (ref 80.0–100.0)
MPV: 11 fL (ref 7.5–12.5)
Monocytes Relative: 12 %
Neutro Abs: 3456 cells/uL (ref 1500–7800)
Neutrophils Relative %: 64 %
PLATELETS: 248 10*3/uL (ref 140–400)
RBC: 5.21 10*6/uL (ref 4.20–5.80)
RDW: 13.9 % (ref 11.0–15.0)
TOTAL LYMPHOCYTE: 21.4 %
WBC: 5.4 10*3/uL (ref 3.8–10.8)
WBCMIX: 648 {cells}/uL (ref 200–950)

## 2018-07-29 LAB — COMPLETE METABOLIC PANEL WITH GFR
AG RATIO: 1.2 (calc) (ref 1.0–2.5)
ALT: 16 U/L (ref 9–46)
AST: 15 U/L (ref 10–35)
Albumin: 3.8 g/dL (ref 3.6–5.1)
Alkaline phosphatase (APISO): 92 U/L (ref 40–115)
BUN/Creatinine Ratio: 15 (calc) (ref 6–22)
BUN: 21 mg/dL (ref 7–25)
CO2: 27 mmol/L (ref 20–32)
CREATININE: 1.44 mg/dL — AB (ref 0.70–1.33)
Calcium: 9 mg/dL (ref 8.6–10.3)
Chloride: 105 mmol/L (ref 98–110)
GFR, EST AFRICAN AMERICAN: 62 mL/min/{1.73_m2} (ref >=60)
GFR, EST NON AFRICAN AMERICAN: 54 mL/min/{1.73_m2} — AB (ref >=60)
GLUCOSE: 91 mg/dL (ref 65–139)
Globulin: 3.2 g/dL (calc) (ref 1.9–3.7)
Potassium: 4.1 mmol/L (ref 3.5–5.3)
Sodium: 140 mmol/L (ref 135–146)
TOTAL PROTEIN: 7 g/dL (ref 6.1–8.1)
Total Bilirubin: 0.3 mg/dL (ref 0.2–1.2)

## 2018-07-29 LAB — HEMOGLOBIN A1C
Hgb A1c MFr Bld: 5.8 % of total Hgb — ABNORMAL HIGH (ref <5.7)
Mean Plasma Glucose: 120 (calc)
eAG (mmol/L): 6.6 (calc)

## 2018-07-29 LAB — LIPID PANEL
Cholesterol: 242 mg/dL — ABNORMAL HIGH (ref <200)
HDL: 57 mg/dL (ref >40)
LDL Cholesterol (Calc): 165 mg/dL (calc) — ABNORMAL HIGH
NON-HDL CHOLESTEROL (CALC): 185 mg/dL — AB (ref <130)
Total CHOL/HDL Ratio: 4.2 (calc) (ref <5.0)
Triglycerides: 91 mg/dL (ref <150)

## 2018-07-29 LAB — PSA: PSA: 1 ng/mL (ref <=4.0)

## 2018-07-29 LAB — HEPATITIS C ANTIBODY
HEP C AB: NONREACTIVE
SIGNAL TO CUT-OFF: 0.09 (ref <1.00)

## 2018-07-29 LAB — TSH: TSH: 3.06 m[IU]/L (ref 0.40–4.50)

## 2018-07-30 ENCOUNTER — Other Ambulatory Visit: Payer: Self-pay | Admitting: Family Medicine

## 2018-07-30 DIAGNOSIS — E782 Mixed hyperlipidemia: Secondary | ICD-10-CM

## 2018-07-30 MED ORDER — ATORVASTATIN CALCIUM 20 MG PO TABS: 20.0000 mg | ORAL_TABLET | Freq: Every day | ORAL | 1 refills | Status: DC

## 2018-07-30 NOTE — Progress Notes (Signed)
Start statin Recheck lipids in 6-8 weeks (ordered)

## 2018-08-04 DIAGNOSIS — D485 Neoplasm of uncertain behavior of skin: Secondary | ICD-10-CM | POA: Diagnosis not present

## 2018-08-04 DIAGNOSIS — R234 Changes in skin texture: Secondary | ICD-10-CM | POA: Diagnosis not present

## 2018-08-04 DIAGNOSIS — L299 Pruritus, unspecified: Secondary | ICD-10-CM | POA: Diagnosis not present

## 2018-08-04 DIAGNOSIS — L578 Other skin changes due to chronic exposure to nonionizing radiation: Secondary | ICD-10-CM | POA: Diagnosis not present

## 2018-08-04 DIAGNOSIS — L820 Inflamed seborrheic keratosis: Secondary | ICD-10-CM | POA: Diagnosis not present

## 2018-08-11 ENCOUNTER — Ambulatory Visit: Payer: Self-pay

## 2018-08-17 DIAGNOSIS — Z23 Encounter for immunization: Secondary | ICD-10-CM | POA: Diagnosis not present

## 2018-09-17 DIAGNOSIS — N183 Chronic kidney disease, stage 3 (moderate): Secondary | ICD-10-CM | POA: Diagnosis not present

## 2018-09-17 DIAGNOSIS — M3214 Glomerular disease in systemic lupus erythematosus: Secondary | ICD-10-CM | POA: Diagnosis not present

## 2018-09-17 DIAGNOSIS — R809 Proteinuria, unspecified: Secondary | ICD-10-CM | POA: Diagnosis not present

## 2018-09-17 DIAGNOSIS — I10 Essential (primary) hypertension: Secondary | ICD-10-CM | POA: Diagnosis not present

## 2018-09-25 ENCOUNTER — Telehealth: Payer: Self-pay | Admitting: Family Medicine

## 2018-09-26 NOTE — Telephone Encounter (Signed)
Patient was due for a cholesterol check around Sept 20th Please have him come in for this Thank you I'll send limited Rx

## 2018-09-27 NOTE — Telephone Encounter (Signed)
Left detailed VM.  

## 2018-10-05 ENCOUNTER — Telehealth (INDEPENDENT_AMBULATORY_CARE_PROVIDER_SITE_OTHER): Payer: Self-pay | Admitting: Vascular Surgery

## 2018-10-05 ENCOUNTER — Ambulatory Visit (INDEPENDENT_AMBULATORY_CARE_PROVIDER_SITE_OTHER): Payer: 59 | Admitting: Vascular Surgery

## 2018-10-05 NOTE — Telephone Encounter (Signed)
Can you call this patient to schedule appointment with the following ultrasounds and see provider afterwards

## 2018-10-05 NOTE — Telephone Encounter (Signed)
I spoke with the patient and he is having bilateral leg pain and swelling,also left ankle rash.I ask the patient if he has taken anything for pain and at this time he has not taking any pain medicine

## 2018-10-05 NOTE — Telephone Encounter (Signed)
Duplicate

## 2018-10-05 NOTE — Telephone Encounter (Signed)
Looks like the last time the patient saw a provider in this office was November 18, 2016.  At that time, the patient was placed in unna wraps to the bilateral lower extremity for edema.  It seems as if he had approximately 2 to 3 weeks of unna boot therapy and then did not follow-up after. The patient should wear graduated compression stockings (20-30 mmHg) on a daily basis. The patient should begin wearing the stockings first thing in the morning and removing them in the evening. The patient should not sleep in the stockings. Elevation of his legs heart level or higher during the day should be initiated. The patient needs an appointment to re-establish care with an ABI and bilateral lower extremity venous duplex to rule out DVT (he has a past medical history) and reflux. The patient should touch base with his primary care about his rash on ankle.

## 2018-10-06 ENCOUNTER — Other Ambulatory Visit (INDEPENDENT_AMBULATORY_CARE_PROVIDER_SITE_OTHER): Payer: Self-pay | Admitting: Vascular Surgery

## 2018-10-06 ENCOUNTER — Other Ambulatory Visit: Payer: Self-pay | Admitting: Family Medicine

## 2018-10-06 DIAGNOSIS — M79604 Pain in right leg: Secondary | ICD-10-CM

## 2018-10-06 DIAGNOSIS — M79605 Pain in left leg: Principal | ICD-10-CM

## 2018-10-06 DIAGNOSIS — I10 Essential (primary) hypertension: Secondary | ICD-10-CM

## 2018-10-07 ENCOUNTER — Ambulatory Visit (INDEPENDENT_AMBULATORY_CARE_PROVIDER_SITE_OTHER): Payer: 59

## 2018-10-07 ENCOUNTER — Encounter (INDEPENDENT_AMBULATORY_CARE_PROVIDER_SITE_OTHER): Payer: Self-pay | Admitting: Vascular Surgery

## 2018-10-07 ENCOUNTER — Ambulatory Visit (INDEPENDENT_AMBULATORY_CARE_PROVIDER_SITE_OTHER): Payer: 59 | Admitting: Vascular Surgery

## 2018-10-07 VITALS — BP 199/112 | HR 72 | Resp 16 | Ht 74.0 in | Wt 253.6 lb

## 2018-10-07 DIAGNOSIS — I890 Lymphedema, not elsewhere classified: Secondary | ICD-10-CM | POA: Diagnosis not present

## 2018-10-07 DIAGNOSIS — I87012 Postthrombotic syndrome with ulcer of left lower extremity: Secondary | ICD-10-CM

## 2018-10-07 DIAGNOSIS — I872 Venous insufficiency (chronic) (peripheral): Secondary | ICD-10-CM

## 2018-10-07 DIAGNOSIS — M79604 Pain in right leg: Secondary | ICD-10-CM

## 2018-10-07 DIAGNOSIS — M79605 Pain in left leg: Secondary | ICD-10-CM | POA: Diagnosis not present

## 2018-10-07 HISTORY — DX: Lymphedema, not elsewhere classified: I89.0

## 2018-10-07 NOTE — Progress Notes (Signed)
Subjective:    Patient ID: Jonathon Snow, male    DOB: 1961/12/03, 57 y.o.   MRN: [id number] Chief Complaint  Patient presents with  . Follow-up    ultrasound follow up   Patient presents with a chief complaint of bilateral lower extremity edema and left ankle discoloration.  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  At the time Unna boot therapy was recommended and it looks like per epic notes the patient underwent 2 to 3 weeks of Unna boot changes and then did not follow-up.  The patient notes that he has been wearing medical grade 1 compression socks, elevating his legs and remaining active as noted in his initial visit to our office.  Conservative therapy does not seem to control his symptoms and he experiences bilateral lower extremity edema left lower extremity worse when compared to the right.  The patient has noticed a progressively worsening "darkening" of the skin located to the medial aspect of the left ankle.  This is the same area in which she had an ulceration years ago.  The patient presents without ulcer today.  Patient denies any claudication-like symptoms or rest pain.  The patient underwent a bilateral ABI which was notable for triphasic tibial waveforms bilaterally with mildly dampened great toe waveforms bilaterally.  Overall, there is no evidence of significant bilateral lower extremity arterial disease.  The bilateral ankle brachial indices are within normal range.  The patient underwent a bilateral lower extremity venous duplex which was notable for venous insufficiency to the bilateral common femoral veins.  Reflux was also noted in the left popliteal and almost the length of the left great saphenous vein.  No evidence of deep vein or superficial thrombophlebitis noted bilaterally.  Denies any fever, nausea or vomiting.  Review of Systems  Constitutional: Negative.   HENT: Negative.   Eyes:  Negative.   Respiratory: Negative.   Cardiovascular: Positive for leg swelling.  Gastrointestinal: Negative.   Endocrine: Negative.   Genitourinary: Negative.   Musculoskeletal: Negative.   Skin: Negative.   Allergic/Immunologic: Negative.   Neurological: Negative.   Hematological: Negative.   Psychiatric/Behavioral: Negative.       Objective:   Physical Exam  Constitutional: He is oriented to person, place, and time. He appears well-developed and well-nourished. No distress.  HENT:  Head: Normocephalic and atraumatic.  Right Ear: External ear normal.  Left Ear: External ear normal.  Eyes: Pupils are equal, round, and reactive to light. Conjunctivae and EOM are normal.  Neck: Normal range of motion.  Cardiovascular: Normal rate, regular rhythm, normal heart sounds and intact distal pulses.  Pulses:      Radial pulses are 2+ on the right side, and 2+ on the left side.       Dorsalis pedis pulses are 2+ on the right side, and 2+ on the left side.       Posterior tibial pulses are 2+ on the right side, and 2+ on the left side.  Pulmonary/Chest: Effort normal and breath sounds normal.  Musculoskeletal: Normal range of motion. He exhibits edema (Mild to moderate bilateral lower extremity edema noted).  Neurological: He is alert and oriented to person, place, and time.  Skin: Skin is warm.  Left lower extremity: Mild stasis dermatitis noted to the medial aspect of the left ankle.  Scar from previous ulceration noted to the area as well.  At this time, there are no  active ulcerations noted.  Psychiatric: He has a normal mood and affect. His behavior is normal. Judgment and thought content normal.  Vitals reviewed.  BP (!) 199/112 (BP Location: Right Arm)   Pulse 72   Resp 16   Ht 6\' 2"  (1.88 m)   Wt 253 lb 9.6 oz (115 kg)   BMI 32.56 kg/m   Past Medical History:  Diagnosis Date  . Depression   . DVT (deep venous thrombosis) ([place])   . GERD (gastroesophageal reflux disease)     . Hyperlipidemia   . Hypertension   . Lupus ([place])   . Pulmonary embolism ([place])   . Renal disorder    Stage III   Social History   Socioeconomic History  . Marital status: Divorced    Spouse name: Not on file  . Number of children: Not on file  . Years of education: Not on file  . Highest education level: Not on file  Occupational History  . Not on file  Social Needs  . Financial resource strain: Not on file  . Food insecurity:    Worry: Not on file    Inability: Not on file  . Transportation needs:    Medical: Not on file    Non-medical: Not on file  Tobacco Use  . Smoking status: Never Smoker  . Smokeless tobacco: Never Used  Substance and Sexual Activity  . Alcohol use: No  . Drug use: No  . Sexual activity: Never  Lifestyle  . Physical activity:    Days per week: Not on file    Minutes per session: Not on file  . Stress: Not on file  Relationships  . Social connections:    Talks on phone: Not on file    Gets together: Not on file    Attends religious service: Not on file    Active member of club or organization: Not on file    Attends meetings of clubs or organizations: Not on file    Relationship status: Not on file  . Intimate partner violence:    Fear of current or ex partner: Not on file    Emotionally abused: Not on file    Physically abused: Not on file    Forced sexual activity: Not on file  Other Topics Concern  . Not on file  Social History Narrative  . Not on file   Past Surgical History:  Procedure Laterality Date  . ANKLE SURGERY Right   . CYST EXCISION  92 or 93    Liver cyst removal [facility]  . I&D EXTREMITY Right 04/29/2017   Procedure: IRRIGATION AND DEBRIDEMENT EXTREMITY;  Surgeon: Clayburn Pert, MD;  Location: [facility] ORS;  Service: General;  Laterality: Right;  . IRRIGATION AND DEBRIDEMENT ABSCESS Left 04/29/2017   Procedure: IRRIGATION AND DEBRIDEMENT Scrotal ABSCESS;  Surgeon: Clayburn Pert, MD;  Location: [facility] ORS;  Service: General;   Laterality: Left;   Family History  Problem Relation Age of Onset  . Hypertension Father   . Heart disease Father   . Clotting disorder Mother   . Kidney disease Brother   . Heart attack Maternal Grandmother   . Heart attack Maternal Grandfather   . Heart attack Paternal Grandfather    Allergies  Allergen Reactions  . Hydrocodone Rash  . Vicodin [Hydrocodone-Acetaminophen] Hives and Rash    Severe headaches (also)      Assessment & Plan:  Patient presents with a chief complaint of bilateral lower extremity edema and left ankle discoloration.  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  At the time Unna boot therapy was recommended and it looks like per epic notes the patient underwent 2 to 3 weeks of Unna boot changes and then did not follow-up.  The patient notes that he has been wearing medical grade 1 compression socks, elevating his legs and remaining active as noted in his initial visit to our office.  Conservative therapy does not seem to control his symptoms and he experiences bilateral lower extremity edema left lower extremity worse when compared to the right.  The patient has noticed a progressively worsening "darkening" of the skin located to the medial aspect of the left ankle.  This is the same area in which she had an ulceration years ago.  The patient presents without ulcer today.  Patient denies any claudication-like symptoms or rest pain.  The patient underwent a bilateral ABI which was notable for triphasic tibial waveforms bilaterally with mildly dampened great toe waveforms bilaterally.  Overall, there is no evidence of significant bilateral lower extremity arterial disease.  The bilateral ankle brachial indices are within normal range.  The patient underwent a bilateral lower extremity venous duplex which was notable for venous insufficiency to the bilateral common femoral veins.  Reflux was also  noted in the left popliteal and almost the length of the left great saphenous vein.  No evidence of deep vein or superficial thrombophlebitis noted bilaterally.  Denies any fever, nausea or vomiting.  1. Postphlebitic syndrome with ulcer, left ([place]) - Resolved At this time, the patient does not have any active ulcerations or post phlebitic syndrome symptoms.  2. Chronic venous insufficiency - New Patient was found to have venous reflux noted to the bilateral common femoral vein.  The left lower extremity was noted to have reflux in the popliteal vein and almost the full length of the great saphenous vein. The patient has been engaging in conservative therapy including wearing medical grade 1 compression socks, elevating his legs and remaining active for years with minimal control of his edema, symptoms and has had ulcer formation to the left medial ankle area in the past. The patient notes that his symptoms have progressed to the point that he is unable to function on a daily basis and they have become lifestyle limiting Recommend endovenous laser ablation to the left great saphenous vein and attempt to improve the patient's symptoms and avoid further ulcer development in the future Procedure, risks and benefits of the procedure explained to the patient Patient was given the endovenous laser ablation brochure The patient is going to "think about it" and he will call the office if he would like to move forward. In the meantime, the patient should continue engaging conservative therapy.  3. Lymphedema - New Strongly encouraged the patient undergo the endovenous laser ablation as he does have a past medical history of ulcer formation to the medial aspect of the left ankle If he decides not to proceed with endovenous laser ablation we can always discuss the addition of a lymphedema pump to help try to control the edema to his lower extremity And waiting for a call back when the patient makes up his  mind about the endovenous laser ablation.  Current Outpatient Medications on File Prior to Visit  Medication Sig Dispense Refill  . Blood Pressure Monitor DEVI I10, check blood pressures daily and as needed; LON 99 months 1 Device 0  . ELIQUIS  5 MG TABS tablet TAKE 1 TABLET BY MOUTH TWICE A DAY 60 tablet 2  . ipratropium-albuterol (DUONEB) 0.5-2.5 (3) MG/3ML SOLN Take 3 mLs by nebulization every 4 (four) hours as needed. 360 mL 3  . losartan (COZAAR) 100 MG tablet Take 100 mg by mouth daily.  6  . mometasone (NASONEX) 50 MCG/ACT nasal spray Place 2 sprays into the nose daily. (2 sprays each nostril) 16 g 11  . Multiple Vitamins-Minerals (MULTIVITAMIN WITH MINERALS) tablet Take 1 tablet by mouth daily.    . mycophenolate (CELLCEPT) 500 MG tablet Take 1,000 mg by mouth 2 (two) times daily.    . predniSONE (DELTASONE) 10 MG tablet Take 10 mg by mouth daily with breakfast.    . amLODipine (NORVASC) 5 MG tablet Take 1 tablet (5 mg total) by mouth daily. (Patient not taking: Reported on 10/07/2018) 90 tablet 3  . atorvastatin (LIPITOR) 20 MG tablet TAKE 1 TABLET BY MOUTH EVERYDAY AT BEDTIME (Patient not taking: Reported on 10/07/2018) 7 tablet 0   No current facility-administered medications on file prior to visit.    There are no Patient Instructions on file for this visit. No follow-ups on file.  Cherrise Occhipinti A Natassia Guthridge, PA-C

## 2018-10-11 ENCOUNTER — Other Ambulatory Visit: Payer: Self-pay | Admitting: Family Medicine

## 2018-10-11 DIAGNOSIS — R0982 Postnasal drip: Secondary | ICD-10-CM

## 2018-10-12 ENCOUNTER — Encounter: Payer: Self-pay | Admitting: Family Medicine

## 2018-10-12 ENCOUNTER — Encounter: Payer: Self-pay | Admitting: Physician Assistant

## 2018-10-12 DIAGNOSIS — M810 Age-related osteoporosis without current pathological fracture: Secondary | ICD-10-CM | POA: Insufficient documentation

## 2018-10-26 ENCOUNTER — Ambulatory Visit: Payer: Self-pay | Admitting: Family Medicine

## 2018-10-28 ENCOUNTER — Ambulatory Visit: Payer: Self-pay | Admitting: Family Medicine

## 2018-11-15 ENCOUNTER — Encounter: Payer: Self-pay | Admitting: Family Medicine

## 2018-11-15 ENCOUNTER — Ambulatory Visit: Payer: 59 | Admitting: Family Medicine

## 2018-11-15 VITALS — BP 124/80 | HR 98 | Temp 97.8°F | Ht 74.0 in | Wt 250.9 lb

## 2018-11-15 DIAGNOSIS — R7303 Prediabetes: Secondary | ICD-10-CM

## 2018-11-15 DIAGNOSIS — R739 Hyperglycemia, unspecified: Secondary | ICD-10-CM

## 2018-11-15 DIAGNOSIS — Z5181 Encounter for therapeutic drug level monitoring: Secondary | ICD-10-CM

## 2018-11-15 DIAGNOSIS — R5383 Other fatigue: Secondary | ICD-10-CM | POA: Diagnosis not present

## 2018-11-15 DIAGNOSIS — M329 Systemic lupus erythematosus, unspecified: Secondary | ICD-10-CM

## 2018-11-15 DIAGNOSIS — J449 Chronic obstructive pulmonary disease, unspecified: Secondary | ICD-10-CM

## 2018-11-15 DIAGNOSIS — E669 Obesity, unspecified: Secondary | ICD-10-CM | POA: Diagnosis not present

## 2018-11-15 DIAGNOSIS — E66811 Obesity, class 1: Secondary | ICD-10-CM

## 2018-11-15 DIAGNOSIS — Z86711 Personal history of pulmonary embolism: Secondary | ICD-10-CM

## 2018-11-15 DIAGNOSIS — E782 Mixed hyperlipidemia: Secondary | ICD-10-CM | POA: Diagnosis not present

## 2018-11-15 DIAGNOSIS — I10 Essential (primary) hypertension: Secondary | ICD-10-CM

## 2018-11-15 DIAGNOSIS — M810 Age-related osteoporosis without current pathological fracture: Secondary | ICD-10-CM

## 2018-11-15 NOTE — Patient Instructions (Addendum)
- increase to 6 cups of water a day  - diversify vegetables  Bad cholesterol, also called low-density lipoprotein (LDL), carries cholesterol and other fats that your liver makes to your body tissue. If it builds up in blood vessels, LDL can cause heart disease and other health problems. Your LDL level should be below 100. If you have diabetes or a possible heart problem, your LDL should be below 70.  Eat: Eat 20 to 30 grams of soluble fiber every day. Foods such as fruits and vegetables, whole grains, beans, peas, nuts, and seeds can help lower LDL. Avoid: Saturated fats (Dairy foods - such as butter, cream, ghee, regular-fat milk and cheese. Meat - such as fatty cuts of beef, pork and lamb, processed meats like salami, sausages and the skin on chicken. Lard., fatty snack foods, cakes, biscuits, pies and deep fried foods) Avoid smoking  Please contact Dr. Raul Del about your shortness of breath and your COPD; he may want to start a daily controller medicine   Obesity, Adult Obesity is having too much body fat. If you have a BMI of 30 or more, you are obese. BMI is a number that explains how much body fat you have. Obesity is often caused by taking in (consuming) more calories than your body uses. Obesity can cause serious health problems. Changing your lifestyle can help to treat obesity. Follow these instructions at home: Eating and drinking   Follow advice from your doctor about what to eat and drink. Your doctor may tell you to: ? Cut down on (limit) fast foods, sweets, and processed snack foods. ? Choose low-fat options. For example, choose low-fat milk instead of whole milk. ? Eat 5 or more servings of fruits or vegetables every day. ? Eat at home more often. This gives you more control over what you eat. ? Choose healthy foods when you eat out. ? Learn what a healthy portion size is. A portion size is the amount of a certain food that is healthy for you to eat at one time. This is  different for each person. ? Keep low-fat snacks available. ? Avoid sugary drinks. These include soda, fruit juice, iced tea that is sweetened with sugar, and flavored milk. ? Eat a healthy breakfast.  Drink enough water to keep your pee (urine) clear or pale yellow.  Do not go without eating for long periods of time (do not fast).  Do not go on popular or trendy diets (fad diets). Physical Activity  Exercise often, as told by your doctor. Ask your doctor: ? What types of exercise are safe for you. ? How often you should exercise.  Warm up and stretch before being active.  Do slow stretching after being active (cool down).  Rest between times of being active. Lifestyle  Limit how much time you spend in front of your TV, computer, or video game system (be less sedentary).  Find ways to reward yourself that do not involve food.  Limit alcohol intake to no more than 1 drink a day for nonpregnant women and 2 drinks a day for men. One drink equals 12 oz of beer, 5 oz of wine, or 1 oz of hard liquor. General instructions  Keep a weight loss journal. This can help you keep track of: ? The food that you eat. ? The exercise that you do.  Take over-the-counter and prescription medicines only as told by your doctor.  Take vitamins and supplements only as told by your doctor.  Think about  joining a support group. Your doctor may be able to help with this.  Keep all follow-up visits as told by your doctor. This is important. Contact a doctor if:  You cannot meet your weight loss goal after you have changed your diet and lifestyle for 6 weeks. This information is not intended to replace advice given to you by your health care provider. Make sure you discuss any questions you have with your health care provider. Document Released: 03/01/2012 Document Revised: 05/15/2016 Document Reviewed: 09/26/2015 Elsevier Interactive Patient Education  2018 [employer].  Preventing Unhealthy  [employer], Adult Staying at a healthy weight is important. When fat builds up in your body, you may become overweight or obese. These conditions put you at greater risk for developing certain health problems, such as heart disease, diabetes, sleeping problems, joint problems, and some cancers. Unhealthy weight gain is often the result of making unhealthy choices in what you eat. It is also a result of not getting enough exercise. You can make changes to your lifestyle to prevent obesity and stay as healthy as possible. What nutrition changes can be made? To maintain a healthy weight and prevent obesity:  Eat only as much as your body needs. To do this: ? Pay attention to signs that you are hungry or full. Stop eating as soon as you feel full. ? If you feel hungry, try drinking water first. Drink enough water so your urine is clear or pale yellow. ? Eat smaller portions. ? Look at serving sizes on food labels. Most foods contain more than one serving per container. ? Eat the recommended amount of calories for your gender and activity level. While most active people should eat around 2,000 calories per day, if you are trying to lose weight or are not very active, you main need to eat less calories. Talk to your health care provider or dietitian about how many calories you should eat each day.  Choose healthy foods, such as: ? Fruits and vegetables. Try to fill at least half of your plate at each meal with fruits and vegetables. ? Whole grains, such as whole wheat bread, brown rice, and quinoa. ? Lean meats, such as chicken or fish. ? Other healthy proteins, such as beans, eggs, or tofu. ? Healthy fats, such as nuts, seeds, fatty fish, and olive oil. ? Low-fat or fat-free dairy.  Check food labels and avoid food and drinks that: ? Are high in calories. ? Have added sugar. ? Are high in sodium. ? Have saturated fats or trans fats.  Limit how much you eat of the following  foods: ? Prepackaged meals. ? Fast food. ? Fried foods. ? Processed meat, such as bacon, sausage, and deli meats. ? Fatty cuts of red meat and poultry with skin.  Cook foods in healthier ways, such as by baking, broiling, or grilling.  When grocery shopping, try to shop around the outside of the store. This helps you buy mostly fresh foods and avoid canned and prepackaged foods.  What lifestyle changes can be made?  Exercise at least 30 minutes 5 or more days each week. Exercising includes brisk walking, yard work, biking, running, swimming, and team sports like basketball and soccer. Ask your health care provider which exercises are safe for you.  Do not use any products that contain nicotine or tobacco, such as cigarettes and e-cigarettes. If you need help quitting, ask your health care provider.  Limit alcohol intake to no more than 1 drink a day  for nonpregnant women and 2 drinks a day for men. One drink equals 12 oz of beer, 5 oz of wine, or 1 oz of hard liquor.  Try to get 7-9 hours of sleep each night. What other changes can be made?  Keep a food and activity journal to keep track of: ? What you ate and how many calories you had. Remember to count sauces, dressings, and side dishes. ? Whether you were active, and what exercises you did. ? Your calorie, weight, and activity goals.  Check your weight regularly. Track any changes. If you notice you have gained weight, make changes to your diet or activity routine.  Avoid taking weight-loss medicines or supplements. Talk to your health care provider before starting any new medicine or supplement.  Talk to your health care provider before trying any new diet or exercise plan. Why are these changes important? Eating healthy, staying active, and having healthy habits not only help prevent obesity, they also:  Help you to manage stress and emotions.  Help you to connect with friends and family.  Improve your  self-esteem.  Improve your sleep.  Prevent long-term health problems.  What can happen if changes are not made? Being obese or overweight can cause you to develop joint or bone problems, which can make it hard for you to stay active or do activities you enjoy. Being obese or overweight also puts stress on your heart and lungs and can lead to health problems like diabetes, heart disease, and some cancers. Where to find more information: Talk with your health care provider or a dietitian about healthy eating and healthy lifestyle choices. You may also find other information through these resources:  U.[place]. Department of Agriculture MyPlate: FormerB[employer].no  American Heart Association: www.heart.org  Centers for Disease Control and Prevention: http://www.wolf.info/  Summary  Staying at a healthy weight is important. It helps prevent certain diseases and health problems, such as heart disease, diabetes, joint problems, sleep disorders, and some cancers.  Being obese or overweight can cause you to develop joint or bone problems, which can make it hard for you to stay active or do activities you enjoy.  You can prevent unhealthy weight gain by eating a healthy diet, exercising regularly, not smoking, limiting alcohol, and getting enough sleep.  Talk with your health care provider or a dietitian for guidance about healthy eating and healthy lifestyle choices. This information is not intended to replace advice given to you by your health care provider. Make sure you discuss any questions you have with your health care provider. Document Released: 12/09/2016 Document Revised: 01/14/2017 Document Reviewed: 01/14/2017 Elsevier Interactive Patient Education  [employer].

## 2018-11-15 NOTE — Progress Notes (Signed)
BP 124/80   Pulse 98   Temp 97.8 F (36.6 C) (Oral)   Ht 6\' 2"  (1.88 m)   Wt 250 lb 14.4 oz (113.8 kg)   SpO2 99%   BMI 32.21 kg/m    Subjective:    Patient ID: Jonathon Snow, male    DOB: Mar 03, 1961, 57 y.o.   MRN: [id number]  HPI: Jonathon Snow is a 57 y.o. male  Chief Complaint  Patient presents with  . Follow-up    HPI  Hypertension rx losartan 100mg  & amlodipine 5mg  daily no issues or missed doses.  Denies headaches, chest pain.  BP Readings from Last 3 Encounters:  11/15/18 124/80  10/07/18 (!) 199/112  07/28/18 (!) 142/98   Chronic Venous Insuffiencey  Wears compression stockings, elevates legs. Recommended ablation- Jonathon Snow is considering it. Has constant leg pain, ulcer has healed  Hyperlipidemia rx atorvastatin 20mg  nightly.  Lab Results  Component Value Date   CHOL 242 (H) 07/28/2018   HDL 57 07/28/2018   LDLCALC 165 (H) 07/28/2018   TRIG 91 07/28/2018   CHOLHDL 4.2 07/28/2018   Obesity Eats out a lot; drinks 6 can diet sodas. Willing to discuss weight loss options; Jonathon Snow really really needs to work on something; Jonathon Snow has put on 50+ pounds since hospitalization; Jonathon Snow is not a good water drinker; work is all Jonathon Snow is able to do, not really any energy for exercise at the end of the day; Jonathon Snow will consider walking BEFORE work when we did some troubleshooting; some poor choices  COPD rx duoneb PRN- states is taking it at least every other day. Endorses shortness of breath with exertion, endorses worse cough in the morning. Using duoneb, 1x a day, not every day; not sure about triggers; Jonathon Snow has [facility] and med helps; Jonathon Snow has had breathing studies done in the past and it was COPD; never smoker. His ex-wife smoked constantly though  Osteoporosis Dexa Scan- completed last month; waiting approval for medicine  History of pulmonary embolism Takes eliquis 5mg  daily and had IVC filter. No bleeding from anywhere  Lupus Takes prednisone 10mg  daily; and cellcept; states is under  control now- doesn't have the nausea, dizziness but still having fatigue. No rashes.   Prediabetes Having dry mouth; some blurred vision on occasion  Depression screen [facility] 2/9 11/15/2018 07/28/2018 03/16/2018 02/02/2018 12/31/2017  Decreased Interest 0 0 0 3 3  Down, Depressed, Hopeless 0 0 0 3 3  PHQ - 2 Score 0 0 0 6 6  Altered sleeping 0 - - 3 3  Tired, decreased energy 0 - - 3 3  Change in appetite 0 - - 3 2  Feeling bad or failure about yourself  0 - - 3 3  Trouble concentrating 0 - - 3 3  Moving slowly or fidgety/restless 0 - - 3 3  Suicidal thoughts 0 - - 0 (No Data)  PHQ-9 Score 0 - - 24 23  Difficult doing work/chores Not difficult at all - - Extremely dIfficult Extremely dIfficult   Fall Risk  11/15/2018 07/28/2018 07/28/2018 03/16/2018 02/02/2018  Falls in the past year? 0 No No No No  Number falls in past yr: 0 - - - -    Relevant past medical, surgical, family and social history reviewed Past Medical History:  Diagnosis Date  . Depression   . DVT (deep venous thrombosis) ([place])   . GERD (gastroesophageal reflux disease)   . Hyperlipidemia   . Hypertension   . Lupus ([place])   .  Osteoporosis   . Pulmonary embolism ([place])   . Renal disorder    Stage III   Past Surgical History:  Procedure Laterality Date  . ANKLE SURGERY Right   . CYST EXCISION  92 or 93    Liver cyst removal [facility]  . I&D EXTREMITY Right 04/29/2017   Procedure: IRRIGATION AND DEBRIDEMENT EXTREMITY;  Surgeon: Clayburn Pert, MD;  Location: [facility] ORS;  Service: General;  Laterality: Right;  . IRRIGATION AND DEBRIDEMENT ABSCESS Left 04/29/2017   Procedure: IRRIGATION AND DEBRIDEMENT Scrotal ABSCESS;  Surgeon: Clayburn Pert, MD;  Location: [facility] ORS;  Service: General;  Laterality: Left;   Family History  Problem Relation Age of Onset  . Hypertension Father   . Heart disease Father   . Clotting disorder Mother   . Kidney disease Brother   . Heart attack Maternal Grandmother   . Heart attack Maternal  Grandfather   . Heart attack Paternal Grandfather    Social History   Tobacco Use  . Smoking status: Never Smoker  . Smokeless tobacco: Never Used  Substance Use Topics  . Alcohol use: No  . Drug use: No     [facility] Visit from 11/15/2018 in [facility]  AUDIT-C Score  0      Interim medical history since last visit reviewed. Allergies and medications reviewed  Review of Systems  Constitutional: Positive for unexpected weight change.  Hematological: Does not bruise/bleed easily.   Per HPI unless specifically indicated above     Objective:    BP 124/80   Pulse 98   Temp 97.8 F (36.6 C) (Oral)   Ht 6\' 2"  (1.88 m)   Wt 250 lb 14.4 oz (113.8 kg)   SpO2 99%   BMI 32.21 kg/m   Wt Readings from Last 3 Encounters:  11/15/18 250 lb 14.4 oz (113.8 kg)  10/07/18 253 lb 9.6 oz (115 kg)  07/28/18 245 lb 14.4 oz (111.5 kg)    Physical Exam  Constitutional: Jonathon Snow appears well-developed and well-nourished. No distress.  obese  HENT:  Head: Normocephalic and atraumatic.  Eyes: EOM are normal. No scleral icterus.  Neck: No thyromegaly present.  Cardiovascular: Normal rate and regular rhythm.  Pulmonary/Chest: Effort normal and breath sounds normal.  Abdominal: Soft. Bowel sounds are normal. Jonathon Snow exhibits no distension.  Musculoskeletal: Jonathon Snow exhibits no edema.  Neurological: Coordination normal.  Skin: Skin is warm and dry. No pallor.  Psychiatric: Jonathon Snow has a normal mood and affect. His behavior is normal. Judgment and thought content normal.    Results for orders placed or performed in visit on 07/28/18  CBC with Differential/Platelet  Result Value Ref Range   WBC 5.4 3.8 - 10.8 Thousand/uL   RBC 5.21 4.20 - 5.80 Million/uL   Hemoglobin 14.6 13.2 - 17.1 g/dL   HCT 44.8 38.5 - 50.0 %   MCV 86.0 80.0 - 100.0 fL   MCH 28.0 27.0 - 33.0 pg   MCHC 32.6 32.0 - 36.0 g/dL   RDW 13.9 11.0 - 15.0 %   Platelets 248 140 - 400 Thousand/uL   MPV 11.0 7.5 - 12.5 fL    Neutro Abs 3,456 1,500 - 7,800 cells/uL   Lymphs Abs 1,156 850 - 3,900 cells/uL   WBC mixed population 648 200 - 950 cells/uL   Eosinophils Absolute 108 15 - 500 cells/uL   Basophils Absolute 32 0 - 200 cells/uL   Neutrophils Relative % 64 %   Total Lymphocyte 21.4 %   Monocytes Relative 12.0 %  Eosinophils Relative 2.0 %   Basophils Relative 0.6 %  COMPLETE METABOLIC PANEL WITH GFR  Result Value Ref Range   Glucose, Bld 91 65 - 139 mg/dL   BUN 21 7 - 25 mg/dL   Creat 1.44 (H) 0.70 - 1.33 mg/dL   GFR, Est Non African American 54 (L) > OR = 60 mL/min/1.77m2   GFR, Est African American 62 > OR = 60 mL/min/1.12m2   BUN/Creatinine Ratio 15 6 - 22 (calc)   Sodium 140 135 - 146 mmol/L   Potassium 4.1 3.5 - 5.3 mmol/L   Chloride 105 98 - 110 mmol/L   CO2 27 20 - 32 mmol/L   Calcium 9.0 8.6 - 10.3 mg/dL   Total Protein 7.0 6.1 - 8.1 g/dL   Albumin 3.8 3.6 - 5.1 g/dL   Globulin 3.2 1.9 - 3.7 g/dL (calc)   AG Ratio 1.2 1.0 - 2.5 (calc)   Total Bilirubin 0.3 0.2 - 1.2 mg/dL   Alkaline phosphatase (APISO) 92 40 - 115 U/L   AST 15 10 - 35 U/L   ALT 16 9 - 46 U/L  Hemoglobin A1c  Result Value Ref Range   Hgb A1c MFr Bld 5.8 (H) <5.7 % of total Hgb   Mean Plasma Glucose 120 (calc)   eAG (mmol/L) 6.6 (calc)  Lipid panel  Result Value Ref Range   Cholesterol 242 (H) <200 mg/dL   HDL 57 >40 mg/dL   Triglycerides 91 <150 mg/dL   LDL Cholesterol (Calc) 165 (H) mg/dL (calc)   Total CHOL/HDL Ratio 4.2 <5.0 (calc)   Non-HDL Cholesterol (Calc) 185 (H) <130 mg/dL (calc)  TSH  Result Value Ref Range   TSH 3.06 0.40 - 4.50 mIU/L  PSA  Result Value Ref Range   PSA 1.0 < OR = 4.0 ng/mL  Hepatitis C antibody  Result Value Ref Range   Hepatitis C Ab NON-REACTIVE NON-REACTI   SIGNAL TO CUT-OFF 0.09 <1.00      Assessment & Plan:   Problem List Items Addressed This Visit      Cardiovascular and Mediastinum   Essential hypertension, benign    Controlled, continue meds         Respiratory   COPD, moderate ([place]) - Primary    Patient to contact Dr. Raul Del, pulmonologist, about his COPD, symptoms        Musculoskeletal and Integument   Osteoporosis    Managed by specialist        Other   Hyperglycemia   Obesity (BMI 30.0-34.9)    Encouragement given for weight loss; refer to nutritionist; return in 3 months; consider medicine at that time if not achieving reasonable success with changes in lifestyle; suggested walking for ten minutes BEFORE work to get metabolism going, some activity; encouraged more water intake      Relevant Orders   Amb ref to Medical Nutrition Therapy-MNT   Hyperlipidemia    LDL was too high last check; recheck lipids today fasting; healthy eating and weight loss      Relevant Orders   Lipid panel   History of pulmonary embolism    On eliquis, no bleeding      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 Sanda Klein.  An after-visit summary was printed and given to the patient at [place].  Please see the patient instructions which may contain other information and recommendations beyond what is mentioned above in the assessment and plan.  No orders of the defined types were placed in this encounter.   Orders Placed This Encounter  Procedures  . Hemoglobin A1c  . COMPLETE METABOLIC PANEL WITH GFR  . Lipid panel  . Vitamin B12  . VITAMIN D 25 Hydroxy (Vit-D Deficiency, Fractures)  . Amb ref to Medical Nutrition Therapy-MNT

## 2018-11-15 NOTE — Assessment & Plan Note (Signed)
Controlled, continue meds

## 2018-11-15 NOTE — Assessment & Plan Note (Signed)
LDL was too high last check; recheck lipids today fasting; healthy eating and weight loss

## 2018-11-15 NOTE — Assessment & Plan Note (Signed)
On eliquis, no bleeding

## 2018-11-15 NOTE — Assessment & Plan Note (Signed)
Managed by specialist 

## 2018-11-15 NOTE — Assessment & Plan Note (Signed)
Patient to contact Dr. Raul Del, pulmonologist, about his COPD, symptoms

## 2018-11-15 NOTE — Assessment & Plan Note (Signed)
Encouragement given for weight loss; refer to nutritionist; return in 3 months; consider medicine at that time if not achieving reasonable success with changes in lifestyle; suggested walking for ten minutes BEFORE work to get metabolism going, some activity; encouraged more water intake

## 2018-11-16 ENCOUNTER — Other Ambulatory Visit: Payer: Self-pay | Admitting: Family Medicine

## 2018-11-16 DIAGNOSIS — E782 Mixed hyperlipidemia: Secondary | ICD-10-CM

## 2018-11-16 LAB — VITAMIN D 25 HYDROXY (VIT D DEFICIENCY, FRACTURES): VIT D 25 HYDROXY: 35 ng/mL (ref 30–100)

## 2018-11-16 LAB — LIPID PANEL
Cholesterol: 255 mg/dL — ABNORMAL HIGH (ref <200)
HDL: 52 mg/dL (ref >40)
LDL Cholesterol (Calc): 181 mg/dL (calc) — ABNORMAL HIGH
NON-HDL CHOLESTEROL (CALC): 203 mg/dL — AB (ref <130)
Total CHOL/HDL Ratio: 4.9 (calc) (ref <5.0)
Triglycerides: 102 mg/dL (ref <150)

## 2018-11-16 LAB — COMPLETE METABOLIC PANEL WITH GFR
AG RATIO: 1.4 (calc) (ref 1.0–2.5)
ALT: 16 U/L (ref 9–46)
AST: 18 U/L (ref 10–35)
Albumin: 4 g/dL (ref 3.6–5.1)
Alkaline phosphatase (APISO): 80 U/L (ref 40–115)
BUN/Creatinine Ratio: 19 (calc) (ref 6–22)
BUN: 29 mg/dL — ABNORMAL HIGH (ref 7–25)
CALCIUM: 9.4 mg/dL (ref 8.6–10.3)
CO2: 26 mmol/L (ref 20–32)
Chloride: 102 mmol/L (ref 98–110)
Creat: 1.49 mg/dL — ABNORMAL HIGH (ref 0.70–1.33)
GFR, EST NON AFRICAN AMERICAN: 52 mL/min/{1.73_m2} — AB (ref >=60)
GFR, Est African American: 60 mL/min/{1.73_m2} (ref >=60)
Globulin: 2.8 g/dL (calc) (ref 1.9–3.7)
Glucose, Bld: 79 mg/dL (ref 65–139)
POTASSIUM: 4.4 mmol/L (ref 3.5–5.3)
SODIUM: 139 mmol/L (ref 135–146)
TOTAL PROTEIN: 6.8 g/dL (ref 6.1–8.1)
Total Bilirubin: 0.6 mg/dL (ref 0.2–1.2)

## 2018-11-16 LAB — HEMOGLOBIN A1C
Hgb A1c MFr Bld: 5.6 % of total Hgb (ref <5.7)
MEAN PLASMA GLUCOSE: 114 (calc)
eAG (mmol/L): 6.3 (calc)

## 2018-11-16 LAB — VITAMIN B12: Vitamin B-12: 409 pg/mL (ref 200–1100)

## 2018-11-16 MED ORDER — ATORVASTATIN CALCIUM 40 MG PO TABS: 40.0000 mg | ORAL_TABLET | Freq: Every day | ORAL | 1 refills | Status: DC

## 2018-11-16 NOTE — Progress Notes (Signed)
Increase statin Recheck lipids in 6-8 weeks; ordered

## 2018-12-09 DIAGNOSIS — N182 Chronic kidney disease, stage 2 (mild): Secondary | ICD-10-CM | POA: Diagnosis not present

## 2018-12-09 DIAGNOSIS — I10 Essential (primary) hypertension: Secondary | ICD-10-CM | POA: Diagnosis not present

## 2018-12-09 DIAGNOSIS — M3214 Glomerular disease in systemic lupus erythematosus: Secondary | ICD-10-CM | POA: Diagnosis not present

## 2018-12-09 DIAGNOSIS — N183 Chronic kidney disease, stage 3 (moderate): Secondary | ICD-10-CM | POA: Diagnosis not present

## 2018-12-09 DIAGNOSIS — R809 Proteinuria, unspecified: Secondary | ICD-10-CM | POA: Diagnosis not present

## 2019-01-04 ENCOUNTER — Other Ambulatory Visit: Payer: Self-pay | Admitting: Family Medicine

## 2019-02-15 ENCOUNTER — Ambulatory Visit
Admission: RE | Admit: 2019-02-15 | Discharge: 2019-02-15 | Disposition: A | Discharge status: Home / Self Care | Payer: PRIVATE HEALTH INSURANCE | Attending: Family Medicine | Admitting: Family Medicine

## 2019-02-15 ENCOUNTER — Ambulatory Visit
Admission: RE | Admit: 2019-02-15 | Discharge: 2019-02-15 | Disposition: A | Discharge status: Home / Self Care | Payer: PRIVATE HEALTH INSURANCE | Source: Ambulatory Visit | Attending: Family Medicine | Admitting: Family Medicine

## 2019-02-15 ENCOUNTER — Ambulatory Visit (INDEPENDENT_AMBULATORY_CARE_PROVIDER_SITE_OTHER): Payer: PRIVATE HEALTH INSURANCE | Admitting: Family Medicine

## 2019-02-15 ENCOUNTER — Encounter: Payer: Self-pay | Admitting: Family Medicine

## 2019-02-15 VITALS — BP 110/80 | HR 98 | Temp 97.6°F | Resp 16 | Ht 75.0 in | Wt 243.2 lb

## 2019-02-15 DIAGNOSIS — E669 Obesity, unspecified: Secondary | ICD-10-CM | POA: Diagnosis not present

## 2019-02-15 DIAGNOSIS — M5441 Lumbago with sciatica, right side: Secondary | ICD-10-CM

## 2019-02-15 DIAGNOSIS — G8929 Other chronic pain: Secondary | ICD-10-CM

## 2019-02-15 DIAGNOSIS — R110 Nausea: Secondary | ICD-10-CM

## 2019-02-15 DIAGNOSIS — M5442 Lumbago with sciatica, left side: Secondary | ICD-10-CM | POA: Diagnosis not present

## 2019-02-15 DIAGNOSIS — M79671 Pain in right foot: Secondary | ICD-10-CM

## 2019-02-15 DIAGNOSIS — R197 Diarrhea, unspecified: Secondary | ICD-10-CM | POA: Insufficient documentation

## 2019-02-15 DIAGNOSIS — E782 Mixed hyperlipidemia: Secondary | ICD-10-CM

## 2019-02-15 DIAGNOSIS — M79672 Pain in left foot: Secondary | ICD-10-CM | POA: Diagnosis present

## 2019-02-15 NOTE — Progress Notes (Signed)
BP 110/80 (BP Location: Left Arm, Patient Position: Sitting, Cuff Size: Normal)   Pulse 98   Temp 97.6 F (36.4 C) (Oral)   Resp 16   Ht 6\' 3"  (1.905 m)   Wt 243 lb 3.2 oz (110.3 kg)   SpO2 97%   BMI 30.40 kg/m    Subjective:    Patient ID: Jonathon Snow, male    DOB: [birth year], 58 y.o.   MRN: [id number]  HPI: Jonathon Snow is a 58 y.o. male  Chief Complaint  Patient presents with  . Follow-up  . Leg Pain  . Foot Pain  . Diarrhea    with nausea over the past 2 weeks    HPI Patient is here for several things Pressing issue is the nausea and diarrhea for 2 weeks No vomiting No travel No fevers Nausea is worse in the morning Eating does not necessarily help it, but doesn't make it worse No blood in the stool; no clear mucous in the toilet The diarrhea is a couple times a day; [place] 6 to 7 No burning with urination, no blood in the urine No one around him has been sick at home No restaurant food before this started No raw seafood No pets, no turtles Drinks city water No camping trips  He is having pain in both heels, feels like bone spurs like he had years ago No recent xrays Walking makes the pain worse He has tried to ease up time on the feet and resting helps Pain is described as aching No ice tried, no NSAIDs No skin changes No numbness  Pain in both thighs; going on for a few months Having back pain; lower back pain; "it hurts"  Difficulty straightening Not tried anything No B/B dysfunction (nsg emergency, I explained) No family hx of back problems No numbness on the front of the thighs Legs do not feel weak or heavy  Obesity; trying to cut back on portions; not alarmed by weight loss; working on this Ideal weight would be around 195 to 210 pounds  Depression screen [facility] 2/9 02/15/2019 11/15/2018 07/28/2018 03/16/2018 02/02/2018  Decreased Interest 0 0 0 0 3  Down, Depressed, Hopeless 0 0 0 0 3  PHQ - 2 Score 0 0 0 0 6  Altered sleeping 1 0 - -  3  Tired, decreased energy 3 0 - - 3  Change in appetite 0 0 - - 3  Feeling bad or failure about yourself  0 0 - - 3  Trouble concentrating 1 0 - - 3  Moving slowly or fidgety/restless - 0 - - 3  Suicidal thoughts 0 0 - - 0  PHQ-9 Score 5 0 - - 24  Difficult doing work/chores Extremely dIfficult Not difficult at all - - Extremely dIfficult   Fall Risk  02/15/2019 11/15/2018 07/28/2018 07/28/2018 03/16/2018  Falls in the past year? 0 0 No No No  Number falls in past yr: 0 0 - - -  Injury with Fall? 0 - - - -  Follow up Falls evaluation completed - - - -    Relevant past medical, surgical, family and social history reviewed Past Medical History:  Diagnosis Date  . Depression   . DVT (deep venous thrombosis) ([place])   . GERD (gastroesophageal reflux disease)   . Hyperlipidemia   . Hypertension   . Lupus ([place])   . Osteoporosis   . Pulmonary embolism ([place])   . Renal disorder    Stage III  Past Surgical History:  Procedure Laterality Date  . ANKLE SURGERY Right   . CYST EXCISION  92 or 93    Liver cyst removal [facility]  . I&D EXTREMITY Right 04/29/2017   Procedure: IRRIGATION AND DEBRIDEMENT EXTREMITY;  Surgeon: Clayburn Pert, MD;  Location: [facility] ORS;  Service: General;  Laterality: Right;  . IRRIGATION AND DEBRIDEMENT ABSCESS Left 04/29/2017   Procedure: IRRIGATION AND DEBRIDEMENT Scrotal ABSCESS;  Surgeon: Clayburn Pert, MD;  Location: [facility] ORS;  Service: General;  Laterality: Left;   Family History  Problem Relation Age of Onset  . Hypertension Father   . Heart disease Father   . Clotting disorder Mother   . Kidney disease Brother   . Heart attack Maternal Grandmother   . Heart attack Maternal Grandfather   . Heart attack Paternal Grandfather    Social History   Tobacco Use  . Smoking status: Never Smoker  . Smokeless tobacco: Never Used  Substance Use Topics  . Alcohol use: No  . Drug use: No     [facility] Visit from 02/15/2019 in [facility]  AUDIT-C  Score  0      Interim medical history since last visit reviewed. Allergies and medications reviewed  Review of Systems Per HPI unless specifically indicated above     Objective:    BP 110/80 (BP Location: Left Arm, Patient Position: Sitting, Cuff Size: Normal)   Pulse 98   Temp 97.6 F (36.4 C) (Oral)   Resp 16   Ht 6\' 3"  (1.905 m)   Wt 243 lb 3.2 oz (110.3 kg)   SpO2 97%   BMI 30.40 kg/m   Wt Readings from Last 3 Encounters:  02/15/19 243 lb 3.2 oz (110.3 kg)  11/15/18 250 lb 14.4 oz (113.8 kg)  10/07/18 253 lb 9.6 oz (115 kg)    Physical Exam Constitutional:      General: He is not in acute distress.    Appearance: He is well-developed. He is obese.  HENT:     Head: Normocephalic and atraumatic.  Eyes:     General: No scleral icterus. Neck:     Thyroid: No thyromegaly.  Cardiovascular:     Rate and Rhythm: Normal rate and regular rhythm.  Pulmonary:     Effort: Pulmonary effort is normal.     Breath sounds: Normal breath sounds.  Abdominal:     General: Bowel sounds are normal. There is no distension.     Palpations: Abdomen is soft.  Skin:    General: Skin is warm and dry.     Coloration: Skin is not pale.  Neurological:     Mental Status: He is alert.     Coordination: Coordination normal.  Psychiatric:        Mood and Affect: Mood is not anxious or depressed.     Comments: Good eye contacted, but restricted range of affect as before       Assessment & Plan:   Problem List Items Addressed This Visit      Other   Hyperlipidemia   Relevant Orders   Lipid panel (Completed)   Obesity (BMI 30.0-34.9)   Relevant Orders   Amb ref to Medical Nutrition Therapy-MNT    Other Visit Diagnoses    Nausea    -  Primary   Relevant Orders   Gastrointestinal Pathogen Panel PCR   CBC with Differential/Platelet (Completed)   COMPLETE METABOLIC PANEL WITH GFR (Completed)   DG Abd Acute W/Chest (Completed)   Diarrhea, unspecified  type       Relevant Orders     Gastrointestinal Pathogen Panel PCR   CBC with Differential/Platelet (Completed)   COMPLETE METABOLIC PANEL WITH GFR (Completed)   DG Abd Acute W/Chest (Completed)   Chronic bilateral low back pain with bilateral sciatica       Relevant Orders   DG Lumbar Spine Complete (Completed)   Foot pain, bilateral       Relevant Orders   DG Foot Complete Left (Completed)   DG Foot Complete Right (Completed)       Follow up plan: Return in about 10 days (around 02/25/2019) for follow-up visit with Dr. Sanda Klein.  An after-visit summary was printed and given to the patient at [place].  Please see the patient instructions which may contain other information and recommendations beyond what is mentioned above in the assessment and plan.  No orders of the defined types were placed in this encounter.   Orders Placed This Encounter  Procedures  . DG Foot Complete Left  . DG Foot Complete Right  . DG Abd Acute W/Chest  . DG Lumbar Spine Complete  . Gastrointestinal Pathogen Panel PCR  . CBC with Differential/Platelet  . COMPLETE METABOLIC PANEL WITH GFR  . Lipid panel  . Amb ref to Medical Nutrition Therapy-MNT

## 2019-02-15 NOTE — Patient Instructions (Addendum)
We'll get labs today Please go across the street for xrays Return in 1-2 weeks for other issues, follow-up If symptoms get severe, go to the ER  Check out the information at familydoctor.org entitled "Nutrition for Weight Loss: What You Need to Know about Fad Diets" Try to lose between 1-2 pounds per week by taking in fewer calories and burning off more calories You can succeed by limiting portions, limiting foods dense in calories and fat, becoming more active, and drinking 8 glasses of water a day (64 ounces) Don't skip meals, especially breakfast, as skipping meals may alter your metabolism Do not use over-the-counter weight loss pills or gimmicks that claim rapid weight loss A healthy BMI (or body mass index) is between 18.5 and 24.9 You can calculate your ideal BMI at the [place] website ClubMonetize.fr   Preventing Unhealthy Weight Gain, Adult Staying at a healthy weight is important to your overall health. When fat builds up in your body, you may become overweight or obese. Being overweight or obese increases your risk of developing certain health problems, such as heart disease, diabetes, sleeping problems, joint problems, and some types of cancer. Unhealthy weight gain is often the result of making unhealthy food choices or not getting enough exercise. You can make changes to your lifestyle to prevent obesity and stay as healthy as possible. What nutrition changes can be made?   Eat only as much as your body needs. To do this: ? Pay attention to signs that you are hungry or full. Stop eating as soon as you feel full. ? If you feel hungry, try drinking water first before eating. Drink enough water so your urine is clear or pale yellow. ? Eat smaller portions. Pay attention to portion sizes when eating out. ? Look at serving sizes on food labels. Most foods contain more than one serving per container. ? Eat the recommended number of  calories for your gender and activity level. For most active people, a daily total of 2,000 calories is appropriate. If you are trying to lose weight or are not very active, you may need to eat fewer calories. Talk with your health care provider or a diet and nutrition specialist (dietitian) about how many calories you need each day.  Choose healthy foods, such as: ? Fruits and vegetables. At each meal, try to fill at least half of your plate with fruits and vegetables. ? Whole grains, such as whole-wheat bread, brown rice, and quinoa. ? Lean meats, such as chicken or fish. ? Other healthy proteins, such as beans, eggs, or tofu. ? Healthy fats, such as nuts, seeds, fatty fish, and olive oil. ? Low-fat or fat-free dairy products.  Check food labels, and avoid food and drinks that: ? Are high in calories. ? Have added sugar. ? Are high in sodium. ? Have saturated fats or trans fats.  Cook foods in healthier ways, such as by baking, broiling, or grilling.  Make a meal plan for the week, and shop with a grocery list to help you stay on track with your purchases. Try to avoid going to the grocery store when you are hungry.  When grocery shopping, try to shop around the outside of the store first, where the fresh foods are. Doing this helps you to avoid prepackaged foods, which can be high in sugar, salt (sodium), and fat. What lifestyle changes can be made?   Exercise for 30 or more minutes on 5 or more days each week. Exercising may include brisk walking,  yard work, biking, running, swimming, and team sports like basketball and soccer. Ask your health care provider which exercises are safe for you.  Do muscle-strengthening activities, such as lifting weights or using resistance bands, on 2 or more days a week.  Do not use any products that contain nicotine or tobacco, such as cigarettes and e-cigarettes. If you need help quitting, ask your health care provider.  Limit alcohol intake to no  more than 1 drink a day for nonpregnant women and 2 drinks a day for men. One drink equals 12 oz of beer, 5 oz of wine, or 1 oz of hard liquor.  Try to get 7-9 hours of sleep each night. What other changes can be made?  Keep a food and activity journal to keep track of: ? What you ate and how many calories you had. Remember to count the calories in sauces, dressings, and side dishes. ? Whether you were active, and what exercises you did. ? Your calorie, weight, and activity goals.  Check your weight regularly. Track any changes. If you notice you have gained weight, make changes to your diet or activity routine.  Avoid taking weight-loss medicines or supplements. Talk to your health care provider before starting any new medicine or supplement.  Talk to your health care provider before trying any new diet or exercise plan. Why are these changes important? Eating healthy, staying active, and having healthy habits can help you to prevent obesity. Those changes also:  Help you manage stress and emotions.  Help you connect with friends and family.  Improve your self-esteem.  Improve your sleep.  Prevent long-term health problems. What can happen if changes are not made? Being obese or overweight can cause you to develop joint or bone problems, which can make it hard for you to stay active or do activities you enjoy. Being obese or overweight also puts stress on your heart and lungs and can lead to health problems like diabetes, heart disease, and some cancers. Where to find more information Talk with your health care provider or a dietitian about healthy eating and healthy lifestyle choices. You may also find information from:  U.S. Department of Agriculture, MyPlate: FormerBoss.no  American Heart Association: www.heart.org  Centers for Disease Control and Prevention: http://www.wolf.info/ Summary  Staying at a healthy weight is important to your overall health. It helps you to  prevent certain diseases and health problems, such as heart disease, diabetes, joint problems, sleep disorders, and some types of cancer.  Being obese or overweight can cause you to develop joint or bone problems, which can make it hard for you to stay active or do activities you enjoy.  You can prevent unhealthy weight gain by eating a healthy diet, exercising regularly, not smoking, limiting alcohol, and getting enough sleep.  Talk with your health care provider or a dietitian for guidance about healthy eating and healthy lifestyle choices. This information is not intended to replace advice given to you by your health care provider. Make sure you discuss any questions you have with your health care provider. Document Released: 12/09/2016 Document Revised: 09/18/2017 Document Reviewed: 01/14/2017 Elsevier Interactive Patient Education  2019 [employer].

## 2019-02-16 ENCOUNTER — Encounter: Payer: Self-pay | Admitting: Family Medicine

## 2019-02-16 ENCOUNTER — Other Ambulatory Visit: Payer: Self-pay | Admitting: Family Medicine

## 2019-02-16 DIAGNOSIS — M7731 Calcaneal spur, right foot: Secondary | ICD-10-CM | POA: Insufficient documentation

## 2019-02-16 DIAGNOSIS — M47816 Spondylosis without myelopathy or radiculopathy, lumbar region: Secondary | ICD-10-CM | POA: Insufficient documentation

## 2019-02-16 DIAGNOSIS — M7732 Calcaneal spur, left foot: Secondary | ICD-10-CM

## 2019-02-16 DIAGNOSIS — M3214 Glomerular disease in systemic lupus erythematosus: Secondary | ICD-10-CM | POA: Insufficient documentation

## 2019-02-16 DIAGNOSIS — M4726 Other spondylosis with radiculopathy, lumbar region: Secondary | ICD-10-CM | POA: Insufficient documentation

## 2019-02-16 DIAGNOSIS — M19071 Primary osteoarthritis, right ankle and foot: Secondary | ICD-10-CM

## 2019-02-16 HISTORY — DX: Calcaneal spur, left foot: M77.32

## 2019-02-16 HISTORY — DX: Calcaneal spur, right foot: M77.31

## 2019-02-16 LAB — COMPLETE METABOLIC PANEL WITH GFR
AG Ratio: 1.3 (calc) (ref 1.0–2.5)
ALT: 13 U/L (ref 9–46)
AST: 14 U/L (ref 10–35)
Albumin: 3.8 g/dL (ref 3.6–5.1)
Alkaline phosphatase (APISO): 75 U/L (ref 35–144)
BUN/Creatinine Ratio: 18 (calc) (ref 6–22)
BUN: 27 mg/dL — ABNORMAL HIGH (ref 7–25)
CO2: 27 mmol/L (ref 20–32)
Calcium: 9 mg/dL (ref 8.6–10.3)
Chloride: 103 mmol/L (ref 98–110)
Creat: 1.54 mg/dL — ABNORMAL HIGH (ref 0.70–1.33)
GFR, EST AFRICAN AMERICAN: 57 mL/min/{1.73_m2} — AB (ref >=60)
GFR, Est Non African American: 49 mL/min/{1.73_m2} — ABNORMAL LOW (ref >=60)
Globulin: 2.9 g/dL (calc) (ref 1.9–3.7)
Glucose, Bld: 94 mg/dL (ref 65–99)
Potassium: 4.2 mmol/L (ref 3.5–5.3)
Sodium: 139 mmol/L (ref 135–146)
Total Bilirubin: 0.8 mg/dL (ref 0.2–1.2)
Total Protein: 6.7 g/dL (ref 6.1–8.1)

## 2019-02-16 LAB — CBC WITH DIFFERENTIAL/PLATELET
ABSOLUTE MONOCYTES: 686 {cells}/uL (ref 200–950)
Basophils Absolute: 19 cells/uL (ref 0–200)
Basophils Relative: 0.4 %
Eosinophils Absolute: 163 cells/uL (ref 15–500)
Eosinophils Relative: 3.4 %
HEMATOCRIT: 47.2 % (ref 38.5–50.0)
Hemoglobin: 15.6 g/dL (ref 13.2–17.1)
Lymphs Abs: 806 cells/uL — ABNORMAL LOW (ref 850–3900)
MCH: 28.6 pg (ref 27.0–33.0)
MCHC: 33.1 g/dL (ref 32.0–36.0)
MCV: 86.4 fL (ref 80.0–100.0)
MPV: 11.4 fL (ref 7.5–12.5)
Monocytes Relative: 14.3 %
NEUTROS PCT: 65.1 %
Neutro Abs: 3125 cells/uL (ref 1500–7800)
Platelets: 220 10*3/uL (ref 140–400)
RBC: 5.46 10*6/uL (ref 4.20–5.80)
RDW: 14.6 % (ref 11.0–15.0)
Total Lymphocyte: 16.8 %
WBC: 4.8 10*3/uL (ref 3.8–10.8)

## 2019-02-16 LAB — LIPID PANEL
Cholesterol: 244 mg/dL — ABNORMAL HIGH (ref <200)
HDL: 50 mg/dL (ref >=40)
LDL Cholesterol (Calc): 169 mg/dL (calc) — ABNORMAL HIGH
Non-HDL Cholesterol (Calc): 194 mg/dL (calc) — ABNORMAL HIGH (ref <130)
Total CHOL/HDL Ratio: 4.9 (calc) (ref <5.0)
Triglycerides: 119 mg/dL (ref <150)

## 2019-02-16 NOTE — Progress Notes (Signed)
Refer to podiatrist 

## 2019-03-01 ENCOUNTER — Ambulatory Visit: Payer: Self-pay | Admitting: Podiatry

## 2019-03-08 ENCOUNTER — Encounter: Payer: Self-pay | Admitting: Podiatry

## 2019-03-08 ENCOUNTER — Ambulatory Visit (INDEPENDENT_AMBULATORY_CARE_PROVIDER_SITE_OTHER): Payer: PRIVATE HEALTH INSURANCE | Admitting: Podiatry

## 2019-03-08 ENCOUNTER — Other Ambulatory Visit: Payer: Self-pay

## 2019-03-08 DIAGNOSIS — M722 Plantar fascial fibromatosis: Secondary | ICD-10-CM

## 2019-03-10 NOTE — Progress Notes (Signed)
   Subjective: 58 year old male presenting today as a new patient with a chief complaint of pain to the plantar aspect of the bilateral heels that began a few weeks ago. He states he was seen at [facility] 2-3 weeks ago and was told her had bone spurs. He is currently taking Prednisone 10 mg daily. Being on his feet for long periods at time increases the pain. Patient is here for further evaluation and treatment.   Past Medical History:  Diagnosis Date  . Depression   . DVT (deep venous thrombosis) ([place])   . GERD (gastroesophageal reflux disease)   . Hyperlipidemia   . Hypertension   . Lupus ([place])   . Osteoporosis   . Pulmonary embolism ([place])   . Renal disorder    Stage III     Objective: Physical Exam General: The patient is alert and oriented x3 in no acute distress.  Dermatology: Skin is warm, dry and supple bilateral lower extremities. Negative for open lesions or macerations bilateral.   Vascular: Dorsalis Pedis and Posterior Tibial pulses palpable bilateral.  Capillary fill time is immediate to all digits.  Neurological: Epicritic and protective threshold intact bilateral.   Musculoskeletal: Tenderness to palpation to the plantar aspect of the bilateral heels along the plantar fascia. All other joints range of motion within normal limits bilateral. Strength 5/5 in all groups bilateral.   Assessment: 1. plantar fasciitis bilateral feet  Plan of Care:  1. Patient evaluated. Xrays in Epic reviewed.   2. Injection of 0.5cc Celestone soluspan injected into the bilateral heels.  3. Plantar fascial band(s) dispensed for bilateral plantar fasciitis. 4. Cannot take oral NSAIDs due to CKD.  5. Patient currently taking low dose Prednisone 10 mg daily.  6. Instructed patient regarding therapies and modalities at home to alleviate symptoms.  7. Prescription for antiinflammatory pain cream to be dispensed by [facility].  8. Return to clinic in 4 weeks.    Manager at [employer].  Edrick Kins, DPM [facility]  Dr. Edrick Kins, DPM    [address], [place] [address]                Office [phone]  Fax [phone]

## 2019-03-14 MED ORDER — NONFORMULARY OR COMPOUNDED ITEM: 2 refills | Status: DC

## 2019-03-14 NOTE — Addendum Note (Signed)
Addended by: Graceann Congress D on: 03/14/2019 10:06 AM   Modules accepted: Orders

## 2019-04-04 ENCOUNTER — Other Ambulatory Visit: Payer: Self-pay | Admitting: Family Medicine

## 2019-04-04 ENCOUNTER — Other Ambulatory Visit: Payer: Self-pay

## 2019-04-04 ENCOUNTER — Encounter: Payer: Self-pay | Admitting: Nurse Practitioner

## 2019-04-04 ENCOUNTER — Ambulatory Visit (INDEPENDENT_AMBULATORY_CARE_PROVIDER_SITE_OTHER): Payer: PRIVATE HEALTH INSURANCE | Admitting: Nurse Practitioner

## 2019-04-04 DIAGNOSIS — J302 Other seasonal allergic rhinitis: Secondary | ICD-10-CM

## 2019-04-04 DIAGNOSIS — R059 Cough, unspecified: Secondary | ICD-10-CM

## 2019-04-04 DIAGNOSIS — R05 Cough: Secondary | ICD-10-CM

## 2019-04-04 MED ORDER — LORATADINE 10 MG PO TABS: 10.0000 mg | ORAL_TABLET | Freq: Every day | ORAL | 11 refills | Status: DC

## 2019-04-04 NOTE — Progress Notes (Signed)
Virtual Visit via Video Note  I connected with Jonathon Snow on 04/04/19 at  3:00 PM EDT by a video enabled telemedicine application and verified that I am speaking with the correct person using two identifiers.   I discussed the limitations of evaluation and management by telemedicine and the availability of in person appointments. The patient expressed understanding and agreed to proceed.  My location: work office.  Patient: home Other parties: none  History of Present Illness:  Patient endorses dry cough, itchy throat, dry mouth, eye puffiness, rhinorrhea and watery eyes feel like his seasonal allergies. States was outside a lot on Friday. Saturday woke up with puffy eyes and they were very itchy and nasal congestion. States started to take his nasal spray- mometasone PRN- states then started to get itchy throat and dry cough Sunday and progressed today. States is mild just feels the need to clear his throat. States he has chronic shortness of breath with exertion- unchanged this, feels at baseline with his COPD. Using duoneb PRN- states hasn't used in the past few days. Denies fevers, chills, nausea, vomiting, diarrhea, change in appetite, sense of smell, sinus pain or pressure.   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 He was in direct contact with this individual. Last contact with them was on Friday.   Observations/Objective: Alert, breathing unlabored and even Mild dry cough  Assessment and Plan: 1. Seasonal allergies  - loratadine (CLARITIN) 10 MG tablet; Take 1 tablet (10 mg total) by mouth daily.  Dispense: 30 tablet; Refill: 11  2. Cough Likely allergic however due to pandemic and positive contact recommended he trial antihistamine to ensure improvements and 3 days of monitoring symptoms. Work note provided- cleared to work if symptoms resolved with antihistamine and 72 hours of no new symptoms discussed. Patient will seek evaluation for new or  unimproved symptoms.    Follow Up Instructions:   PRN  I discussed the assessment and treatment plan with the patient. The patient was provided an opportunity to ask questions and all were answered. The patient agreed with the plan and demonstrated an understanding of the instructions.   The patient was advised to call back or seek an in-person evaluation if the symptoms worsen or if the condition fails to improve as anticipated.  I provided 16 minutes of non-face-to-face time during this encounter.   Fredderick Severance, NP

## 2019-04-05 ENCOUNTER — Ambulatory Visit: Payer: PRIVATE HEALTH INSURANCE | Admitting: Podiatry

## 2019-04-07 ENCOUNTER — Telehealth: Payer: Self-pay | Admitting: Family Medicine

## 2019-04-07 ENCOUNTER — Encounter: Payer: Self-pay | Admitting: Family Medicine

## 2019-04-07 ENCOUNTER — Other Ambulatory Visit: Payer: Self-pay

## 2019-04-07 ENCOUNTER — Ambulatory Visit (INDEPENDENT_AMBULATORY_CARE_PROVIDER_SITE_OTHER): Payer: PRIVATE HEALTH INSURANCE | Admitting: Family Medicine

## 2019-04-07 DIAGNOSIS — Z20822 Contact with and (suspected) exposure to covid-19: Secondary | ICD-10-CM

## 2019-04-07 DIAGNOSIS — Z20818 Contact with and (suspected) exposure to other bacterial communicable diseases: Secondary | ICD-10-CM

## 2019-04-07 DIAGNOSIS — R6889 Other general symptoms and signs: Secondary | ICD-10-CM | POA: Diagnosis not present

## 2019-04-07 NOTE — Telephone Encounter (Signed)
Jonathon Snow at [facility] is calling patient now.

## 2019-04-07 NOTE — Progress Notes (Signed)
There were no vitals taken for this visit.   Subjective:    Patient ID: Jonathon Snow, male    DOB: [birth year], 58 y.o.   MRN: [id number]  HPI: Jonathon Snow is a 58 y.o. male  Chief Complaint  Patient presents with  . URI    Cough, diarrhea, nasal congestion, headache, itchy/irritated throat. Started Sunday, pt seems to be feeling worse. Possible COVID exposure.     HPI Virtual Visit via Telephone/Video Note   I connected with the patient via:  Doximity video I verified that I am speaking with the correct person using two identifiers.  Call started: 11;28 am Call terminated: 11:41 am Total length of call: 13 minutes   I discussed the limitations, risks, and privacy concerns of performing an evaluation and management service by telephone and the availability of in-person appointments. I explained that he/she may be responsible for charges related to this service. The patient expressed understanding and agreed to proceed.  Provider location: home, upstairs office with door closed, earphones/headset on Patient location: home Additional participants: no one else  He says this started on Sunday; started developing a cough; lots of pollen over theweekend Thought his allergies were up at first He started to have issues with sinuses and drainage; throat was itchy and scratchy Developed a cough He found out on Saturrday, he reported it to his superiors at work They have a COVID-19 task force at work; they sent him home and they told him to take 7 days to makes sure symptoms free; this is 72 hours now after his symptoms subsided Throat and sinuses are more irritated He did have some diarrhea; no blood in the stools; just a little bit, one time yesterday They have an employee co-worker who is being treated for COVID-19, one of employees He has not had any fever The cough is starting to be more congested; mostly dry though He has a headache He is breathing okay right now; does  not feel short of breath; no labored breathing Staying hydrated We reviewed the information below; he denies travel    Jonathon Snow has one or more symptoms of COVID-19 (cough, fever, shortness of breath, loss of smell and/or sore throat).   Give the patient a mask and initiate appropriate isolation precautions (including eye protection for staff).  Notify provider.    This is a remote visit  Coronavirus (COVID-19) Are you at risk?  Are you at risk for the Coronavirus (COVID-19)?  To be considered high risk for Coronavirus (COVID-19), you have to meet the following criteria:  Traveled to [place], [place], [place], [place] or [place]; or in the [place] to [place], [place], [place], or [place]; and have fever, cough, and shortness of breath within the last 2 weeks of travel or  Been in close contact with a person diagnosed with COVID-19 within the last 2 weeks and have fever, cough, and shortness of breath  If you do not meet these criteria, you are considered low risk for COVID-19.  What to do if you are high risk for COVID-19?  [name] If you are having a medical emergency, call 911. . Seek medical care right away. Before you go to a doctor's office, urgent care or emergency department, call ahead and tell them about your recent travel, contact with someone diagnosed with COVID-19, and your symptoms. You should receive instructions from your physician's office regarding next steps of care.  . When you arrive at healthcare provider,  tell the healthcare staff immediately you have returned from visiting [place], [place], [place], [place] or [place]; or traveled in the [place] to [place], [place], [place], or [place]; in the last two weeks or you have been in close contact with a person diagnosed with COVID-19 in the last 2 weeks.   . Tell the health care staff about your symptoms: fever, cough and shortness of breath. . After you have been seen by a medical provider,  you will be either: o Tested for (COVID-19) and discharged home on quarantine except to seek medical care if symptoms worsen, and asked to  - Stay home and avoid contact with others until you get your results (4-5 days)  - Avoid travel on public transportation if possible (such as bus, train, or airplane) or o Sent to the Emergency Department by EMS for evaluation, COVID-19 testing, and possible admission depending on your condition and test results.   What to do if you are LOW RISK for COVID-19?  Reduce your risk of any infection by using the same precautions used for avoiding the common cold or flu:  [name] Wash your hands often with soap and warm water for at least 20 seconds.  If soap and water are not readily available, use an alcohol-based hand sanitizer with at least 60% alcohol.  . If coughing or sneezing, cover your mouth and nose by coughing or sneezing into the elbow areas of your shirt or coat, into a tissue or into your sleeve (not your hands). . Avoid shaking hands with others and consider head nods or verbal greetings only. . Avoid touching your eyes, nose, or mouth with unwashed hands.  . Avoid close contact with people who are sick. . Avoid places or events with large numbers of people in one location, like concerts or sporting events. . Carefully consider travel plans you have or are making. . If you are planning any travel outside or inside the [place], visit the CDC's Travelers' Health webpage for the latest health notices. . If you have some symptoms but not all symptoms, continue to monitor at home and seek medical attention if your symptoms worsen. . If you are having a medical emergency, call 911.   Additional healthcare options for patients  [place] / e-Visit:  [email or web address]           [facility]: [facility]: [phone]                   [facility]:  [phone]   I reviewed the above with the patient and he wrote down these numbers for [place] and [facility]  Depression screen [facility] 2/9 04/04/2019 02/15/2019 11/15/2018 07/28/2018 03/16/2018  Decreased Interest 0 0 0 0 0  Down, Depressed, Hopeless 0 0 0 0 0  PHQ - 2 Score 0 0 0 0 0  Altered sleeping 0 1 0 - -  Tired, decreased energy 0 3 0 - -  Change in appetite 0 0 0 - -  Feeling bad or failure about yourself  0 0 0 - -  Trouble concentrating 0 1 0 - -  Moving slowly or fidgety/restless 0 - 0 - -  Suicidal thoughts 0 0 0 - -  PHQ-9 Score 0 5 0 - -  Difficult doing work/chores Not difficult at all Extremely dIfficult Not difficult at all - -   Fall Risk  04/07/2019 04/04/2019 02/15/2019 11/15/2018 07/28/2018  Falls in  the past year? 0 0 0 0 No  Number falls in past yr: - 0 0 0 -  Injury with Fall? - 0 0 - -  Follow up - - Falls evaluation completed - -    Relevant past medical, surgical, family and social history reviewed Past Medical History:  Diagnosis Date  . Depression   . DVT (deep venous thrombosis) ([place])   . GERD (gastroesophageal reflux disease)   . Hyperlipidemia   . Hypertension   . Lupus ([place])   . Osteoporosis   . Pulmonary embolism ([place])   . Renal disorder    Stage III   Past Surgical History:  Procedure Laterality Date  . ANKLE SURGERY Right   . CYST EXCISION  92 or 93    Liver cyst removal [facility]  . I&D EXTREMITY Right 04/29/2017   Procedure: IRRIGATION AND DEBRIDEMENT EXTREMITY;  Surgeon: Clayburn Pert, MD;  Location: [facility] ORS;  Service: General;  Laterality: Right;  . IRRIGATION AND DEBRIDEMENT ABSCESS Left 04/29/2017   Procedure: IRRIGATION AND DEBRIDEMENT Scrotal ABSCESS;  Surgeon: Clayburn Pert, MD;  Location: [facility] ORS;  Service: General;  Laterality: Left;   Family History  Problem Relation Age of Onset  . Hypertension Father   . Heart disease Father   . Clotting disorder Mother   . Kidney disease Brother   . Heart attack Maternal Grandmother   .  Heart attack Maternal Grandfather   . Heart attack Paternal Grandfather    Social History   Tobacco Use  . Smoking status: Never Smoker  . Smokeless tobacco: Never Used  Substance Use Topics  . Alcohol use: No  . Drug use: No     [facility] Visit from 04/07/2019 in [facility]  AUDIT-C Score  0      Interim medical history since last visit reviewed. Allergies and medications reviewed  Review of Systems Per HPI unless specifically indicated above     Objective:    There were no vitals taken for this visit.  Wt Readings from Last 3 Encounters:  02/15/19 243 lb 3.2 oz (110.3 kg)  11/15/18 250 lb 14.4 oz (113.8 kg)  10/07/18 253 lb 9.6 oz (115 kg)    Physical Exam Constitutional:      General: He is not in acute distress.    Appearance: He is not ill-appearing or diaphoretic.  Eyes:     General: No scleral icterus. Pulmonary:     Effort: No tachypnea or respiratory distress.     Comments: He appears to be breathing normal; able to speak in full sentences without stopping for breath; he cough one time during our visit, not barky; no audible wheezing; breathing is not labored at all Skin:    Coloration: Skin is not jaundiced or pale.     Comments: No diaphoresis  Neurological:     Mental Status: He is alert.  Psychiatric:        Mood and Affect: Mood is not anxious or depressed.        Speech: Speech is not rapid and pressured, delayed or slurred.        Behavior: Behavior normal.        Cognition and Memory: Cognition normal.     Comments: Good eye contact       Assessment & Plan:   Problem List Items Addressed This Visit      Other   Suspected Covid-19 Virus Infection - Primary    Lengthy discussion about the disease, can progress rapidly,  and I instructed him to call 911 if needed, or to call urgent care (he wrote down the numbers to [place] and [place] as listed in HPI); we discussed the need to be out of work for at least 7 days since onset  of symptoms, AND for at least 72 hours after last fever; will notify health department to see about testing and monitoring; explained he should not let any one in his house; known contacts will also need to quarantine themselves for 14 days; I will write him out of work until Tuesday and we'll have a video visit on Monday to decide at that time if he is safe to return back to work; opportunity for questions, he had no other concerns; agrees with plan and will seek medical care if condition worsens          Follow up plan: No follow-ups on file.  No orders of the defined types were placed in this encounter.   No orders of the defined types were placed in this encounter.  Out of work until at least Tuesday; video visit on Friday afternoon and again Monday morning; decide Monday if appropriate to return to work on Tuesday  His personal contacts need to quarantine themselves for 14 days I will contact health dept to alert them and see if testing available Work note out until Tuesday Put letter in [employer]

## 2019-04-07 NOTE — Telephone Encounter (Signed)
Please schedule patient for an appointment With whom: Dr. Sanda Klein When: Friday early afternoon April 17th AND Monday morning April 20th How: VIDEO Why: probably COVID-19 Thank you

## 2019-04-07 NOTE — Telephone Encounter (Signed)
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

## 2019-04-07 NOTE — Assessment & Plan Note (Signed)
Lengthy discussion about the disease, can progress rapidly, and I instructed him to call 911 if needed, or to call urgent care (he wrote down the numbers to [place] and [place] as listed in HPI); we discussed the need to be out of work for at least 7 days since onset of symptoms, AND for at least 72 hours after last fever; will notify health department to see about testing and monitoring; explained he should not let any one in his house; known contacts will also need to quarantine themselves for 14 days; I will write him out of work until Tuesday and we'll have a video visit on Monday to decide at that time if he is safe to return back to work; opportunity for questions, he had no other concerns; agrees with plan and will seek medical care if condition worsens

## 2019-04-08 ENCOUNTER — Telehealth: Payer: Self-pay | Admitting: Family Medicine

## 2019-04-08 ENCOUNTER — Telehealth: Payer: Self-pay

## 2019-04-08 NOTE — Telephone Encounter (Signed)
I am very glad to hear this Thank you

## 2019-04-08 NOTE — Telephone Encounter (Signed)
I called patient, still unable to reach him Explained that I will call the sheriff to do a welfare check on him Left detailed message; if needing emergent help, call 911; if needing to speak to nurse or doctor after hours, call main office number, number given --------------------------------------- I spoke with Jonathon Snow and explained the situation Asked for a welfare check They will check on him and give me a call back shortly

## 2019-04-08 NOTE — Telephone Encounter (Signed)
I called patient, Friday afternoon to check on him Suspected COVID-19 patient I reached voice mailbox; left message Calling to see how he's doing Asked him to please pick up; if I don't get a response with my next call, I'll ask sheriff to come check and do a welfare visit to just make sure he is okay

## 2019-04-08 NOTE — Telephone Encounter (Signed)
Pt returned call to office. Pt stated he is well and does not need authorities to be sent to his home. Pt stated he would like to thank Dr.[name] for her concern but he is doing well.

## 2019-04-11 ENCOUNTER — Ambulatory Visit (INDEPENDENT_AMBULATORY_CARE_PROVIDER_SITE_OTHER): Payer: PRIVATE HEALTH INSURANCE | Admitting: Nurse Practitioner

## 2019-04-11 ENCOUNTER — Encounter: Payer: Self-pay | Admitting: Family Medicine

## 2019-04-11 ENCOUNTER — Other Ambulatory Visit: Payer: Self-pay

## 2019-04-11 VITALS — Resp 14

## 2019-04-11 DIAGNOSIS — J302 Other seasonal allergic rhinitis: Secondary | ICD-10-CM | POA: Diagnosis not present

## 2019-04-11 DIAGNOSIS — R059 Cough, unspecified: Secondary | ICD-10-CM

## 2019-04-11 DIAGNOSIS — R05 Cough: Secondary | ICD-10-CM

## 2019-04-11 MED ORDER — MONTELUKAST SODIUM 10 MG PO TABS: 10.0000 mg | ORAL_TABLET | Freq: Every day | ORAL | 1 refills | Status: DC

## 2019-04-11 MED ORDER — PROMETHAZINE-DM 6.25-15 MG/5ML PO SYRP: 2.5000 mL | ORAL_SOLUTION | Freq: Four times a day (QID) | ORAL | 0 refills | Status: DC | PRN

## 2019-04-11 NOTE — [place] Notes (Signed)
Virtual Visit via Video Note  I connected with Jonathon Snow on 04/11/19 at  9:40 AM EDT by a video enabled telemedicine application and verified that I am speaking with the correct person using two identifiers.   Staff discussed the limitations of evaluation and management by telemedicine and the availability of in person appointments. The patient expressed understanding and agreed to proceed.  Patient location: home  My location: work office Other people present:  None  HPI  States he is feeling the same. States has dry cough- it was congested but now is dry, sinus congestion, rhinorrhea, watery eyes, itchy throat. States cough is here and there, no coughing spells.  No fevers, shortness of breath.  He is using nasonex nasal spray once a day- mild improvement with congestion Loratadine daily- no improvement.   PHQ2/9: Depression screen [facility] 2/9 04/04/2019 02/15/2019 11/15/2018 07/28/2018 03/16/2018  Decreased Interest 0 0 0 0 0  Down, Depressed, Hopeless 0 0 0 0 0  PHQ - 2 Score 0 0 0 0 0  Altered sleeping 0 1 0 - -  Tired, decreased energy 0 3 0 - -  Change in appetite 0 0 0 - -  Feeling bad or failure about yourself  0 0 0 - -  Trouble concentrating 0 1 0 - -  Moving slowly or fidgety/restless 0 - 0 - -  Suicidal thoughts 0 0 0 - -  PHQ-9 Score 0 5 0 - -  Difficult doing work/chores Not difficult at all Extremely dIfficult Not difficult at all - -     PHQ reviewed. Negative  Patient Active Problem List   Diagnosis Date Noted  . Suspected Covid-19 Virus Infection 04/07/2019  . 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 Heel spur, left 02/16/2019  . Heel spur, right 02/16/2019  . 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 Osteoporosis 10/12/2018  . Chronic venous insufficiency 10/07/2018  . Lymphedema 10/07/2018  . Essential hypertension, benign 07/28/2018  . 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 Opiate abuse, episodic ([place]) 02/26/2018  . Syncope  12/29/2017  . Postphlebitic syndrome with ulcer, left ([place]) 11/18/2016  . 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 History of colonoscopy with polypectomy 05/13/2016  . Hyperglycemia 04/17/2016  . Hyperlipidemia 01/15/2016  . Hypertension 01/15/2016  . Obesity (BMI 30.0-34.9) 01/15/2016  . Hematuria 10/04/2015  . 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[place]) 05/30/2015  . Abnormal presence of protein in urine 05/30/2015  . Pulmonary embolism ([place]) 05/30/2015  . COPD, moderate ([place]) 06/27/2014  . History of pulmonary embolism 12/11/2013  . Nodule of right lung 12/11/2013  . Right ankle fracture 12/10/2013  . 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 Calculus of kidney 08/21/2013    Past Medical History:  Diagnosis Date  . Depression   . DVT (deep venous thrombosis) ([place])   . GERD (gastroesophageal reflux disease)   . Hyperlipidemia   . Hypertension   . Lupus ([place])   . Osteoporosis   . Pulmonary embolism ([place])   . Renal disorder    Stage III    Past Surgical History:  Procedure Laterality Date  . ANKLE SURGERY Right   . CYST EXCISION  92 or 93    Liver cyst removal [facility]  . I&D EXTREMITY Right 04/29/2017   Procedure: IRRIGATION AND DEBRIDEMENT EXTREMITY;  Surgeon: Clayburn Pert, MD;  Location: [facility] ORS;  Service: General;  Laterality: Right;  .  IRRIGATION AND DEBRIDEMENT ABSCESS Left 04/29/2017   Procedure: IRRIGATION AND DEBRIDEMENT Scrotal ABSCESS;  Surgeon: Clayburn Pert, MD;  Location: [facility] ORS;  Service: General;  Laterality: Left;    Social History   Tobacco Use  . Smoking status: Never Smoker  . Smokeless tobacco: Never Used  Substance Use Topics  . Alcohol use: No     Current Outpatient Medications:  .  amLODipine (NORVASC) 5 MG tablet, Take 1 tablet (5 mg total) by mouth daily., Disp: 90 tablet, Rfl: 3 .  atorvastatin (LIPITOR) 40 MG  tablet, Take 1 tablet (40 mg total) by mouth at bedtime., Disp: 30 tablet, Rfl: 1 .  Blood Pressure Monitor DEVI, I10, check blood pressures daily and as needed; LON 99 months, Disp: 1 Device, Rfl: 0 .  ELIQUIS 5 MG TABS tablet, TAKE ONE TABLET BY MOUTH TWICE A DAY, Disp: 60 tablet, Rfl: 2 .  ipratropium-albuterol (DUONEB) 0.5-2.5 (3) MG/3ML SOLN, Take 3 mLs by nebulization every 4 (four) hours as needed., Disp: 360 mL, Rfl: 3 .  loratadine (CLARITIN) 10 MG tablet, Take 1 tablet (10 mg total) by mouth daily., Disp: 30 tablet, Rfl: 11 .  losartan (COZAAR) 100 MG tablet, Take 100 mg by mouth daily., Disp: , Rfl: 6 .  mometasone (NASONEX) 50 MCG/ACT nasal spray, PLACE 2 SPRAYS INTO THE NOSE DAILY. (2 SPRAYS EACH NOSTRIL), Disp: 1 g, Rfl: 11 .  Multiple Vitamins-Minerals (MULTIVITAMIN WITH MINERALS) tablet, Take 1 tablet by mouth daily., Disp: , Rfl:  .  mycophenolate (CELLCEPT) 500 MG tablet, Take 1,000 mg by mouth 2 (two) times daily., Disp: , Rfl:  .  predniSONE (DELTASONE) 10 MG tablet, Take 10 mg by mouth daily with breakfast., Disp: , Rfl:  .  montelukast (SINGULAIR) 10 MG tablet, Take 1 tablet (10 mg total) by mouth at bedtime., Disp: 30 tablet, Rfl: 1 .  NONFORMULARY OR COMPOUNDED ITEM, See pharmacy note (Patient not taking: Reported on 04/04/2019), Disp: 120 each, Rfl: 2 .  promethazine-dextromethorphan (PROMETHAZINE-DM) 6.25-15 MG/5ML syrup, Take 2.5 mLs by mouth 4 (four) times daily as needed., Disp: 118 mL, Rfl: 0  Allergies  Allergen Reactions  . Hydrocodone Rash  . Vicodin [Hydrocodone-Acetaminophen] Hives and Rash    Severe headaches (also)    ROS   No other specific complaints in a complete review of systems (except as listed in HPI above).  Objective  Vitals:   04/11/19 1033  Resp: 14     There is no height or weight on file to calculate BMI.  Nursing Note and Vital Signs reviewed.  Physical Exam   Constitutional: Patient appears well-developed and well-nourished.  No distress.  HENT: Head: Normocephalic and atraumatic. Pulmonary/Chest: Effort normal  Musculoskeletal: Normal range of motion,  Neurological: he is alert and oriented to person, place, and time. speech and gait are normal. .  Psychiatric: Patient has a normal mood and affect. behavior is normal. Judgment and thought content normal.    Assessment & Plan  1. Seasonal allergies Due to no improvement with Claritin will add on Singulair; discused BB warning. Follow-up in 3 days to ensure improvement and return to work if so.  - montelukast (SINGULAIR) 10 MG tablet; Take 1 tablet (10 mg total) by mouth at bedtime.  Dispense: 30 tablet; Refill: 1  2. Cough - promethazine-dextromethorphan (PROMETHAZINE-DM) 6.25-15 MG/5ML syrup; Take 2.5 mLs by mouth 4 (four) times daily as needed.  Dispense: 118 mL; Refill: 0    Follow Up Instructions:   follow up in 3 days  to see if able to return to work.  I discussed the assessment and treatment plan with the patient. The patient was provided an opportunity to ask questions and all were answered. The patient agreed with the plan and demonstrated an understanding of the instructions.   The patient was advised to call back or seek an in-person evaluation if the symptoms worsen or if the condition fails to improve as anticipated.  I provided 11 minutes of non-face-to-face time during this encounter.   Fredderick Severance, NP

## 2019-04-11 NOTE — Telephone Encounter (Signed)
I called IT I did not write this letter Ticket logged

## 2019-04-12 ENCOUNTER — Encounter: Payer: Self-pay | Admitting: Nurse Practitioner

## 2019-04-12 ENCOUNTER — Ambulatory Visit: Payer: PRIVATE HEALTH INSURANCE | Admitting: Podiatry

## 2019-04-12 NOTE — Telephone Encounter (Signed)
Previous letter was voided Glass blower/designer sent new letter to patient with correct provider's name on it

## 2019-04-12 NOTE — Telephone Encounter (Signed)
Going through open notes This note is empty Signing off

## 2019-04-13 ENCOUNTER — Encounter: Payer: Self-pay | Admitting: Family Medicine

## 2019-04-13 ENCOUNTER — Encounter: Payer: Self-pay | Admitting: Nurse Practitioner

## 2019-04-13 NOTE — Telephone Encounter (Signed)
This phone note is still open  Routing History   Priority Sent On From To Message Type   04/07/2019 11:54 AM [name], [name], MD [name] Patient Calls   [name], [name]      Patient was seen on Monday (04/11/2019)  I personally called him Friday (04/08/2019) evening when I realized he wasn't seen that day; see separate phone note  Signing off on note

## 2019-04-13 NOTE — [place] Notes (Signed)
Return to work sent as patient has met CDC criteria for suspected covid return to work

## 2019-04-13 NOTE — Telephone Encounter (Signed)
erro  neous encounter

## 2019-09-12 ENCOUNTER — Encounter: Payer: Self-pay | Admitting: Podiatry

## 2019-09-12 ENCOUNTER — Other Ambulatory Visit: Payer: Self-pay

## 2019-09-12 ENCOUNTER — Ambulatory Visit (INDEPENDENT_AMBULATORY_CARE_PROVIDER_SITE_OTHER): Payer: PRIVATE HEALTH INSURANCE | Admitting: Podiatry

## 2019-09-12 DIAGNOSIS — M722 Plantar fascial fibromatosis: Secondary | ICD-10-CM

## 2019-09-12 DIAGNOSIS — F330 Major depressive disorder, recurrent, mild: Secondary | ICD-10-CM | POA: Insufficient documentation

## 2019-09-12 DIAGNOSIS — F332 Major depressive disorder, recurrent severe without psychotic features: Secondary | ICD-10-CM | POA: Insufficient documentation

## 2019-09-12 NOTE — [place] Notes (Signed)
This patient presents the office with chief complaint of pain noted on the bottom of both heels.  He says he is in severe pain by the end of a day at work.  He states that he experiences severe pain noted upon rising in the morning and standing on his feet by the end of the day.  He says this is been present for approximately 2 to 3 weeks.  He was seen in March by Dr. Amalia Hailey who provided injection therapy and plantar fascial braces.  He states that he never returned since his foot was markedly better and the COVID was present.  He presents the office today for an evaluation and treatment of his painful heels.  Vascular  Dorsalis pedis and posterior tibial pulses are palpable  B/L.  Capillary return  WNL.  Temperature gradient is  WNL.  Skin turgor  WNL  Sensorium  Senn Weinstein monofilament wire  WNL. Normal tactile sensation.  Nail Exam  Patient has normal nails with no evidence of bacterial or fungal infection.  Orthopedic  Exam  Muscle tone and muscle strength  WNL.  No limitations of motion feet  B/L.  No crepitus or joint effusion noted.  Foot type is unremarkable and digits show no abnormalities.  Bony prominences are unremarkable. Retrocalcaneal exostosis left heel.  Palpable pain at insertion plantar fascia  B/L.  Skin  No open lesions.  Normal skin texture and turgor..  Plantar fasciitis  B/L  ROV.  Discussed this condition with this patient.  Was prescribed power step insoles to be worn in his shoes patient was provided with injection therapy in both heels. Injection therapy using 1.0 cc. Of 2% xylocaine( 20 mg.) plus 1 cc. of kenalog-la ( 10 mg) plus 1/2 cc. of dexamethazone phosphate ( 2 mg).  RTC 3 weeks.  To consider orthoses in the future if needed.   Gardiner Barefoot DPM

## 2019-09-13 ENCOUNTER — Ambulatory Visit (INDEPENDENT_AMBULATORY_CARE_PROVIDER_SITE_OTHER): Payer: PRIVATE HEALTH INSURANCE | Admitting: Emergency Medicine

## 2019-09-13 DIAGNOSIS — Z23 Encounter for immunization: Secondary | ICD-10-CM | POA: Diagnosis not present

## 2019-10-03 ENCOUNTER — Ambulatory Visit: Payer: PRIVATE HEALTH INSURANCE | Admitting: Podiatry

## 2019-10-10 ENCOUNTER — Ambulatory Visit (INDEPENDENT_AMBULATORY_CARE_PROVIDER_SITE_OTHER): Payer: PRIVATE HEALTH INSURANCE | Admitting: Podiatry

## 2019-10-10 ENCOUNTER — Encounter: Payer: Self-pay | Admitting: Podiatry

## 2019-10-10 ENCOUNTER — Other Ambulatory Visit: Payer: Self-pay

## 2019-10-10 DIAGNOSIS — M722 Plantar fascial fibromatosis: Secondary | ICD-10-CM

## 2019-10-10 NOTE — [place] Notes (Signed)
This patient returns to the office follow-up for diagnosis of plantar fasciitis bilaterally.  Patient was seen approximately 4 weeks ago was treated with injection therapy in both heels.  He says that his heels are improved but he still is limping especially on his right foot.  Patient returns to the office for continued evaluation and treatment of both heels.  Vascular  Dorsalis pedis and posterior tibial pulses are palpable  B/L.  Capillary return  WNL.  Temperature gradient is  WNL.  Skin turgor  WNL  Sensorium  Senn Weinstein monofilament wire  WNL. Normal tactile sensation.  Nail Exam  Patient has normal nails with no evidence of bacterial or fungal infection.  Orthopedic  Exam  Muscle tone and muscle strength  WNL.  No limitations of motion feet  B/L.  No crepitus or joint effusion noted.  Foot type is unremarkable and digits show no abnormalities.  Bony prominences are unremarkable.  Palpable pain noted at the insertion of the plantar fascia bilaterally.  Skin  No open lesions.  Normal skin texture and turgor.  Plantar Fasciitis  B/L.  ROV.  Watch this patient come into the treatment room and he has an antalgic gait.  Therefore I discussed continued conservative care such as insoles and night braces.  Discussed surgical correction of the plantar fascial bilateral with this patient.  Presently patient decides to proceed with conservative care and a night splint was dispensed for this patient.  Patient is to make a follow-up appointment with [facility] for orthoses.  Return to clinic as needed   Gardiner Barefoot DPM

## 2019-10-26 ENCOUNTER — Other Ambulatory Visit: Payer: PRIVATE HEALTH INSURANCE | Admitting: Orthotics

## 2019-11-09 ENCOUNTER — Ambulatory Visit (INDEPENDENT_AMBULATORY_CARE_PROVIDER_SITE_OTHER): Payer: PRIVATE HEALTH INSURANCE | Admitting: Orthotics

## 2019-11-09 ENCOUNTER — Other Ambulatory Visit: Payer: Self-pay

## 2019-11-09 DIAGNOSIS — M722 Plantar fascial fibromatosis: Secondary | ICD-10-CM

## 2019-11-09 DIAGNOSIS — M7732 Calcaneal spur, left foot: Secondary | ICD-10-CM

## 2019-11-09 NOTE — [place] Notes (Signed)
Patient came into today for casting bilateral f/o to address plantar fasciitis.  Patient reports history of foot pain involving plantar aponeurosis.  Goal is to provide longitudinal arch support and correct any RF instability due to heel eversion/inversion.  Ultimate goal is to relieve tension at pf insertion calcaneal tuberosity.  Plan on semi-rigid device addressing heel stability and relieving PF tension.    

## 2019-12-02 ENCOUNTER — Other Ambulatory Visit: Payer: Self-pay

## 2019-12-02 ENCOUNTER — Ambulatory Visit: Payer: PRIVATE HEALTH INSURANCE | Admitting: Orthotics

## 2019-12-02 DIAGNOSIS — M722 Plantar fascial fibromatosis: Secondary | ICD-10-CM

## 2019-12-02 DIAGNOSIS — M7732 Calcaneal spur, left foot: Secondary | ICD-10-CM

## 2019-12-02 NOTE — [place] Notes (Signed)
Patient came in today to pick up custom made foot orthotics.  The goals were accomplished and the patient reported no dissatisfaction with said orthotics.  Patient was advised of breakin period and how to report any issues. 

## 2019-12-07 ENCOUNTER — Other Ambulatory Visit: Payer: PRIVATE HEALTH INSURANCE | Admitting: Orthotics

## 2019-12-29 ENCOUNTER — Ambulatory Visit: Payer: Self-pay | Admitting: *Deleted

## 2019-12-29 NOTE — Telephone Encounter (Signed)
Summary: cough, congestion    Patient requesting call back from RN to discuss cough and congestion. No availability with PCP office.      Sinus congestion with drainage- cough present 1 week- dry cough. Patient is not treating at this time. Patient does work with public- but denies known exposure to [place]. Advised UC for evaluation.  Reason for Disposition . [1] Ankle swelling AND [2] swelling is increasing    Patient does report increased swelling in ankles in the last few days.  Answer Assessment - Initial Assessment Questions 1. ONSET: "When did the cough begin?"      1 week 2. SEVERITY: "How bad is the cough today?"      Hacking cough- builds up 3. RESPIRATORY DISTRESS: "Describe your breathing."      COPD history- aggrevating COPD 4. FEVER: "Do you have a fever?" If so, ask: "What is your temperature, how was it measured, and when did it start?"     No fever 5. HEMOPTYSIS: "Are you coughing up any blood?" If so ask: "How much?" (flecks, streaks, tablespoons, etc.)     no 6. TREATMENT: "What have you done so far to treat the cough?" (e.g., meds, fluids, humidifier)     Nothing yet 7. CARDIAC HISTORY: "Do you have any history of heart disease?" (e.g., heart attack, congestive heart failure)      High BP/cholesterol 8. LUNG HISTORY: "Do you have any history of lung disease?"  (e.g., pulmonary embolus, asthma, emphysema)     COPD, pulmonary embolus hx 9. PE RISK FACTORS: "Do you have a history of blood clots?" (or: recent major surgery, recent prolonged travel, bedridden)     no 10. OTHER SYMPTOMS: "Do you have any other symptoms? (e.g., runny nose, wheezing, chest pain)       Sinus congestion 11. PREGNANCY: "Is there any chance you are pregnant?" "When was your last menstrual period?"       n/a 12. TRAVEL: "Have you traveled out of the country in the last month?" (e.g., travel history, exposures)       n/a  Protocols used: COUGH - ACUTE NON-PRODUCTIVE-A-AH

## 2020-01-11 ENCOUNTER — Other Ambulatory Visit: Payer: Self-pay

## 2020-01-11 ENCOUNTER — Other Ambulatory Visit: Payer: Self-pay | Admitting: Family Medicine

## 2020-01-11 NOTE — Telephone Encounter (Signed)
Pt needs an appt

## 2020-01-11 NOTE — Telephone Encounter (Signed)
Copied from [place] [id number]. Topic: Quick Communication - Rx Refill/Question >> Jan 11, 2020  1:04 PM Yvette Rack wrote: Medication: ELIQUIS 5 MG TABS tablet  Has the patient contacted their pharmacy? yes   Preferred Pharmacy (with phone number or street name): [facility], [place]  Phone: [phone]  Fax: [phone]  Agent: Please be advised that RX refills may take up to 3 business days. We ask that you follow-up with your pharmacy.

## 2020-01-12 MED ORDER — APIXABAN 5 MG PO TABS: 5.0000 mg | ORAL_TABLET | Freq: Two times a day (BID) | ORAL | 2 refills | Status: DC

## 2020-01-12 NOTE — Telephone Encounter (Signed)
Pt is scheduled for Monday 01/16/2020 and is asking for a refill to be sent before Monday due to him not having enough to last until then.

## 2020-01-16 ENCOUNTER — Ambulatory Visit: Payer: PRIVATE HEALTH INSURANCE | Admitting: Family Medicine

## 2020-01-17 ENCOUNTER — Ambulatory Visit: Payer: PRIVATE HEALTH INSURANCE | Admitting: Family Medicine

## 2020-01-17 ENCOUNTER — Other Ambulatory Visit: Payer: Self-pay

## 2020-01-17 ENCOUNTER — Encounter: Payer: Self-pay | Admitting: Family Medicine

## 2020-01-17 ENCOUNTER — Ambulatory Visit
Admission: RE | Admit: 2020-01-17 | Discharge: 2020-01-17 | Disposition: A | Discharge status: Home / Self Care | Payer: Commercial Managed Care - HMO | Source: Ambulatory Visit | Attending: Family Medicine | Admitting: Family Medicine

## 2020-01-17 VITALS — BP 124/84 | HR 98 | Temp 97.1°F | Resp 20 | Ht 75.0 in | Wt 248.8 lb

## 2020-01-17 DIAGNOSIS — J302 Other seasonal allergic rhinitis: Secondary | ICD-10-CM

## 2020-01-17 DIAGNOSIS — J449 Chronic obstructive pulmonary disease, unspecified: Secondary | ICD-10-CM

## 2020-01-17 DIAGNOSIS — Z7901 Long term (current) use of anticoagulants: Secondary | ICD-10-CM

## 2020-01-17 DIAGNOSIS — E782 Mixed hyperlipidemia: Secondary | ICD-10-CM

## 2020-01-17 DIAGNOSIS — J441 Chronic obstructive pulmonary disease with (acute) exacerbation: Secondary | ICD-10-CM | POA: Insufficient documentation

## 2020-01-17 DIAGNOSIS — M3214 Glomerular disease in systemic lupus erythematosus: Secondary | ICD-10-CM

## 2020-01-17 DIAGNOSIS — Z5181 Encounter for therapeutic drug level monitoring: Secondary | ICD-10-CM

## 2020-01-17 DIAGNOSIS — I10 Essential (primary) hypertension: Secondary | ICD-10-CM | POA: Diagnosis not present

## 2020-01-17 DIAGNOSIS — J310 Chronic rhinitis: Secondary | ICD-10-CM

## 2020-01-17 DIAGNOSIS — I2782 Chronic pulmonary embolism: Secondary | ICD-10-CM

## 2020-01-17 DIAGNOSIS — D849 Immunodeficiency, unspecified: Secondary | ICD-10-CM | POA: Diagnosis present

## 2020-01-17 DIAGNOSIS — J329 Chronic sinusitis, unspecified: Secondary | ICD-10-CM

## 2020-01-17 DIAGNOSIS — R0602 Shortness of breath: Secondary | ICD-10-CM | POA: Diagnosis present

## 2020-01-17 DIAGNOSIS — Z1211 Encounter for screening for malignant neoplasm of colon: Secondary | ICD-10-CM

## 2020-01-17 MED ORDER — ALBUTEROL SULFATE HFA 108 (90 BASE) MCG/ACT IN AERS: 2.0000 | INHALATION_SPRAY | Freq: Four times a day (QID) | RESPIRATORY_TRACT | 2 refills | Status: DC | PRN

## 2020-01-17 MED ORDER — MONTELUKAST SODIUM 10 MG PO TABS: 10.0000 mg | ORAL_TABLET | Freq: Every day | ORAL | 1 refills | Status: DC

## 2020-01-17 MED ORDER — AMLODIPINE BESYLATE 5 MG PO TABS: 5.0000 mg | ORAL_TABLET | Freq: Every day | ORAL | 3 refills | Status: DC

## 2020-01-17 MED ORDER — IPRATROPIUM-ALBUTEROL 0.5-2.5 (3) MG/3ML IN SOLN: 3.0000 mL | RESPIRATORY_TRACT | 3 refills | Status: DC | PRN

## 2020-01-17 MED ORDER — LORATADINE 10 MG PO TABS: 10.0000 mg | ORAL_TABLET | Freq: Every day | ORAL | 11 refills | Status: DC

## 2020-01-17 MED ORDER — APIXABAN 5 MG PO TABS: 5.0000 mg | ORAL_TABLET | Freq: Two times a day (BID) | ORAL | 2 refills | Status: DC

## 2020-01-17 MED ORDER — LOSARTAN POTASSIUM 100 MG PO TABS: 100.0000 mg | ORAL_TABLET | Freq: Every day | ORAL | 3 refills | Status: DC

## 2020-01-17 MED ORDER — TRIAMCINOLONE ACETONIDE 0.1 % EX OINT: 1.0000 "application " | TOPICAL_OINTMENT | Freq: Two times a day (BID) | CUTANEOUS | 0 refills | Status: DC | PRN

## 2020-01-17 NOTE — Progress Notes (Signed)
Name: Jonathon Snow   MRN: [id number]    DOB: 03/27/1961   Date:01/17/2020       Progress Note  Chief Complaint  Patient presents with  . Medication Refill    elquis  . Allergies  . Hypertension     Subjective:   Jonathon Snow is a 59 y.o. male, presents to clinic for routine follow up on the conditions listed above.  Here for routine f/u   COPD-  w/o inhalers, previously went to Pulm/Dr. Raul Del but no longer seeing and he is out of inhalers. He recently had worsening breathing sx and cough, about 2 weeks ago went to walk in clinic at [facility], tested negative for COVID and was treated for COPD exacerbations.  Usually occurs 1-2 x a year, typically has an exacerbation this time of year, he was tx doxycyline , still having some drainage, scratchy throat, no fever chills sweats  Lupus on cellcept and 10 mg prednisone daily - sees nephrology for management [facility] [place] [facility]  HLD lipitor 40 mg, no change to baseline myalgias from his normal.   He has been out of meds for several months, has not been to PCP in a while, states hes " a bad patient"   HTN- Losartan 100 mg daily, good compliance, only not taking amlodipine 5 mg for several months, was previously on 10 mg amlodipine. BP Readings from Last 3 Encounters:  01/17/20 124/84  02/15/19 110/80  11/15/18 124/80  BP well controlled today  Chronic PE - on eliquis, needs refill.  He denies any spontaneous bruising, bleeding.  Denies melena, hematochezia, hematuria.     Patient Active Problem List   Diagnosis Date Noted  . Severe episode of recurrent major depressive disorder, without psychotic features ([place]) 09/12/2019  . Suspected COVID-19 virus infection 04/07/2019  . SLE glomerulonephritis syndrome ([place]) 02/16/2019  . Heel spur, left 02/16/2019  . Heel spur, right 02/16/2019  . 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 Osteoporosis 10/12/2018  . Chronic venous insufficiency 10/07/2018  .  Lymphedema 10/07/2018  . Essential hypertension, benign 07/28/2018  . 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 Opiate abuse, episodic ([place]) 02/26/2018  . Syncope 12/29/2017  . Postphlebitic syndrome with ulcer, left ([place]) 11/18/2016  . 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 ([place]) 10/29/2016  . History of colonoscopy with polypectomy 05/13/2016  . Hyperglycemia 04/17/2016  . Hyperlipidemia 01/15/2016  . Hypertension 01/15/2016  . Obesity (BMI 30.0-34.9) 01/15/2016  . Hematuria 10/04/2015  . 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[place]) 05/30/2015  . Abnormal presence of protein in urine 05/30/2015  . Pulmonary embolism ([place]) 05/30/2015  . COPD, moderate ([place]) 06/27/2014  . History of pulmonary embolism 12/11/2013  . Nodule of right lung 12/11/2013  . Right ankle fracture 12/10/2013  . 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 Calculus of kidney 08/21/2013    Past Surgical History:  Procedure Laterality Date  . ANKLE SURGERY Right   . CYST EXCISION  92 or 93    Liver cyst removal [facility]  . I & D EXTREMITY Right 04/29/2017   Procedure: IRRIGATION AND DEBRIDEMENT EXTREMITY;  Surgeon: Clayburn Pert, MD;  Location: [facility] ORS;  Service: General;  Laterality: Right;  . IRRIGATION AND DEBRIDEMENT ABSCESS Left 04/29/2017   Procedure: IRRIGATION AND DEBRIDEMENT Scrotal ABSCESS;  Surgeon: Clayburn Pert, MD;  Location: [facility] ORS;  Service: General;  Laterality: Left;    Family History  Problem Relation Age of Onset  . Hypertension Father   . Heart disease Father   . Clotting disorder Mother   . Kidney disease Brother   . Heart attack Maternal Grandmother   . Heart attack Maternal Grandfather   . Heart attack Paternal Grandfather     Social History   Socioeconomic History  . Marital status: Divorced    Spouse name: Not on file  . Number of children: Not on file  . Years of education: Not  on file  . Highest education level: Not on file  Occupational History  . Not on file  Tobacco Use  . Smoking status: Never Smoker  . Smokeless tobacco: Never Used  Substance and Sexual Activity  . Alcohol use: No  . Drug use: No  . Sexual activity: Never  Other Topics Concern  . Not on file  Social History Narrative  . Not on file   Social Determinants of Health   Financial Resource Strain:   . Difficulty of Paying Living Expenses: Not on file  Food Insecurity:   . Worried About Charity fundraiser in the Last Year: Not on file  . Ran Out of Food in the Last Year: Not on file  Transportation Needs:   . Lack of Transportation (Medical): Not on file  . Lack of Transportation (Non-Medical): Not on file  Physical Activity:   . Days of Exercise per Week: Not on file  . Minutes of Exercise per Session: Not on file  Stress:   . Feeling of Stress : Not on file  Social Connections:   . Frequency of Communication with Friends and Family: Not on file  . Frequency of Social Gatherings with Friends and Family: Not on file  . Attends Religious Services: Not on file  . Active Member of Clubs or Organizations: Not on file  . Attends Archivist Meetings: Not on file  . Marital Status: Not on file  Intimate Partner Violence:   . Fear of Current or Ex-Partner: Not on file  . Emotionally Abused: Not on file  . Physically Abused: Not on file  . Sexually Abused: Not on file     Current Outpatient Medications:  .  apixaban (ELIQUIS) 5 MG TABS tablet, Take 1 tablet (5 mg total) by mouth 2 (two) times daily., Disp: 60 tablet, Rfl: 2 .  ipratropium-albuterol (DUONEB) 0.5-2.5 (3) MG/3ML SOLN, Take 3 mLs by nebulization every 4 (four) hours as needed., Disp: 360 mL, Rfl: 3 .  losartan (COZAAR) 100 MG tablet, Take 100 mg by mouth daily., Disp: , Rfl: 6 .  Multiple Vitamins-Minerals (MULTIVITAMIN WITH MINERALS) tablet, Take 1 tablet by mouth daily., Disp: , Rfl:  .  mycophenolate  (CELLCEPT) 500 MG tablet, Take 1,000 mg by mouth 2 (two) times daily., Disp: , Rfl:  .  predniSONE (DELTASONE) 10 MG tablet, Take 10 mg by mouth daily with breakfast., Disp: , Rfl:  .  amLODipine (NORVASC) 5 MG tablet, Take 1 tablet (5 mg total) by mouth daily. (Patient not taking: Reported on 01/17/2020), Disp: 90 tablet, Rfl: 3 .  atorvastatin (LIPITOR) 40 MG tablet, Take 1 tablet (40 mg total) by mouth at bedtime. (Patient not taking: Reported on 01/17/2020), Disp: 30 tablet, Rfl: 1 .  loratadine (CLARITIN) 10 MG tablet, Take 1 tablet (10 mg total) by mouth daily. (Patient not taking: Reported on 01/17/2020), Disp: 30 tablet, Rfl: 11 .  mometasone (NASONEX) 50 MCG/ACT nasal spray, PLACE 2 SPRAYS INTO THE NOSE DAILY. (2  SPRAYS EACH NOSTRIL) (Patient not taking: Reported on 01/17/2020), Disp: 1 g, Rfl: 11 .  montelukast (SINGULAIR) 10 MG tablet, Take 1 tablet (10 mg total) by mouth at bedtime. (Patient not taking: Reported on 01/17/2020), Disp: 30 tablet, Rfl: 1 .  NONFORMULARY OR COMPOUNDED ITEM, See pharmacy note (Patient not taking: Reported on 01/17/2020), Disp: 120 each, Rfl: 2 .  promethazine-dextromethorphan (PROMETHAZINE-DM) 6.25-15 MG/5ML syrup, Take 2.5 mLs by mouth 4 (four) times daily as needed. (Patient not taking: Reported on 01/17/2020), Disp: 118 mL, Rfl: 0  Allergies  Allergen Reactions  . Hydrocodone Rash  . Vicodin [Hydrocodone-Acetaminophen] Hives and Rash    Severe headaches (also)    Chart Review Today: I personally reviewed active problem list, medication list, allergies, family history, social history, health maintenance, notes from last encounter, lab results, imaging with the patient/caregiver today.   Review of Systems  Constitutional: Negative.   HENT: Negative.   Eyes: Negative.   Respiratory: Negative.   Cardiovascular: Negative.   Gastrointestinal: Negative.   Endocrine: Negative.   Genitourinary: Negative.   Musculoskeletal: Negative.   Skin: Negative.     Allergic/Immunologic: Negative.   Neurological: Negative.   Hematological: Negative.   Psychiatric/Behavioral: Negative.   All other systems reviewed and are negative.    Objective:    Vitals:   01/17/20 1029  BP: 124/84  Pulse: 98  Resp: 20  Temp: (!) 97.1 F (36.2 C)  TempSrc: Temporal  SpO2: 99%  Weight: 248 lb 12.8 oz (112.9 kg)  Height: 6\' 3"  (1.905 m)    Body mass index is 31.1 kg/m.  Physical Exam Vitals and nursing note reviewed.  Constitutional:      General: He is not in acute distress.    Appearance: Normal appearance. He is well-developed. He is obese. He is not ill-appearing, toxic-appearing or diaphoretic.     Interventions: Face mask in place.  HENT:     Head: Normocephalic and atraumatic.     Jaw: No trismus.     Right Ear: External ear normal.     Left Ear: External ear normal.     Nose: Mucosal edema, congestion and rhinorrhea present.     Right Turbinates: Swollen.     Left Turbinates: Swollen.     Right Sinus: No maxillary sinus tenderness or frontal sinus tenderness.     Left Sinus: No maxillary sinus tenderness or frontal sinus tenderness.     Mouth/Throat:     Mouth: Mucous membranes are moist.     Pharynx: Posterior oropharyngeal erythema present. No oropharyngeal exudate.  Eyes:     General: Lids are normal. No scleral icterus.       Right eye: No discharge.        Left eye: No discharge.     Conjunctiva/sclera: Conjunctivae normal.     Pupils: Pupils are equal, round, and reactive to light.  Neck:     Trachea: Trachea and phonation normal. No tracheal deviation.  Cardiovascular:     Rate and Rhythm: Normal rate and regular rhythm.     Pulses: Normal pulses.          Radial pulses are 2+ on the right side and 2+ on the left side.       Posterior tibial pulses are 2+ on the right side and 2+ on the left side.     Heart sounds: Normal heart sounds. No murmur. No friction rub. No gallop.   Pulmonary:     Effort: Pulmonary effort is  normal. No  respiratory distress.     Breath sounds: Normal breath sounds. No stridor. No wheezing, rhonchi or rales.  Abdominal:     General: Bowel sounds are normal. There is no distension.     Palpations: Abdomen is soft.     Tenderness: There is no abdominal tenderness. There is no guarding or rebound.  Musculoskeletal:        General: Normal range of motion.     Cervical back: Normal range of motion and neck supple.     Right lower leg: Edema present.     Left lower leg: Edema present.  Skin:    General: Skin is warm and dry.     Capillary Refill: Capillary refill takes less than 2 seconds.     Coloration: Skin is not jaundiced.     Findings: No rash.     Nails: There is no clubbing.  Neurological:     Mental Status: He is alert.     Cranial Nerves: No dysarthria or facial asymmetry.     Motor: No tremor or abnormal muscle tone.     Gait: Gait normal.  Psychiatric:        Mood and Affect: Mood normal.        Speech: Speech normal.        Behavior: Behavior normal. Behavior is cooperative.     PHQ2/9: Depression screen [facility] 2/9 01/17/2020 04/04/2019 02/15/2019 11/15/2018 07/28/2018  Decreased Interest 0 0 0 0 0  Down, Depressed, Hopeless 0 0 0 0 0  PHQ - 2 Score 0 0 0 0 0  Altered sleeping 1 0 1 0 -  Tired, decreased energy 3 0 3 0 -  Change in appetite 1 0 0 0 -  Feeling bad or failure about yourself  0 0 0 0 -  Trouble concentrating 0 0 1 0 -  Moving slowly or fidgety/restless 0 0 - 0 -  Suicidal thoughts 0 0 0 0 -  PHQ-9 Score 5 0 5 0 -  Difficult doing work/chores Very difficult Not difficult at all Extremely dIfficult Not difficult at all -  Some recent data might be hidden    phq 9 is positive, reviewed today  Fall Risk: Fall Risk  01/17/2020 04/11/2019 04/07/2019 04/04/2019 02/15/2019  Falls in the past year? 0 0 0 0 0  Number falls in past yr: 0 - - 0 0  Injury with Fall? 0 - - 0 0  Follow up - - - - Falls evaluation completed    Functional Status Survey: Is the  patient deaf or have difficulty hearing?: No Does the patient have difficulty seeing, even when wearing glasses/contacts?: No Does the patient have difficulty concentrating, remembering, or making decisions?: No Does the patient have difficulty walking or climbing stairs?: Yes(climbing stairs) Does the patient have difficulty dressing or bathing?: No Does the patient have difficulty doing errands alone such as visiting a doctor's office or shopping?: No   Assessment & Plan:     ICD-10-CM   1. Long term current use of anticoagulant  Z79.01 apixaban (ELIQUIS) 5 MG TABS tablet    CBC with Differential/Platelet    COMPLETE METABOLIC PANEL WITH GFR   labs for renal function and H/H, refill  2. Essential hypertension  I10 amLODipine (NORVASC) 5 MG tablet    losartan (COZAAR) 100 MG tablet    COMPLETE METABOLIC PANEL WITH GFR   well controlled, but with lupus nephritis encouraged pt to restart norvasc as previously prescribed  3. COPD, moderate ([place])  J44.9 ipratropium-albuterol (DUONEB) 0.5-2.5 (3) MG/3ML SOLN    DG Chest 2 View    albuterol (VENTOLIN HFA) 108 (90 Base) MCG/ACT inhaler    DISCONTINUED: albuterol (VENTOLIN HFA) 108 (90 Base) MCG/ACT inhaler   undercontrolled with recent exacerbation, encouraged pt to f/up with pulmonology, refills given on inhalers and neb tx  4. Lupus nephritis (HCC)  0000000 COMPLETE METABOLIC PANEL WITH GFR   per nephrology  5. Mixed hyperlipidemia  99991111 COMPLETE METABOLIC PANEL WITH GFR    Lipid panel   non-compliant with statin meds for several months, labs and refill  6. Immunocompromised ([place])  D84.9 DG Chest 2 View  7. COPD exacerbation (HCC)  J44.1 ipratropium-albuterol (DUONEB) 0.5-2.5 (3) MG/3ML SOLN    DG Chest 2 View   recently tx by [facility], some sx persist  8. Rhinosinusitis  J31.0 loratadine (CLARITIN) 10 MG tablet   J32.9 montelukast (SINGULAIR) 10 MG tablet   allergy vs viral, no sinus ttp, nasal mucosa and OP very erythematous  and edematous  9. Shortness of breath  R06.02 ipratropium-albuterol (DUONEB) 0.5-2.5 (3) MG/3ML SOLN    DG Chest 2 View  10. Other chronic pulmonary embolism without acute cor pulmonale (HCC)  I27.82   11. Seasonal allergies  J30.2 loratadine (CLARITIN) 10 MG tablet    montelukast (SINGULAIR) 10 MG tablet   encouraged compliance to allergy control meds  12. Screening for colon cancer  Z12.11 Ambulatory referral to Gastroenterology  13. Encounter for medication monitoring  Z51.81 CBC with Differential/Platelet    COMPLETE METABOLIC PANEL WITH GFR    Lipid panel    albuterol (VENTOLIN HFA) 108 (90 Base) MCG/ACT inhaler    DISCONTINUED: albuterol (VENTOLIN HFA) 108 (90 Base) MCG/ACT inhaler   Pt presents for routine f/up, med refill, states he has not been compliant with some meds and does not like to go to the doctors.  He is immunocompromised, this is due to daily 10 mg prednisone use and cellcept for lupus.  He was ill about 2 weeks ago evaluated and treated by [facility] walk in clinic, some continued sx, will get CXR to eval.  Nasal passages and OP appear inflammed - unclear if it is from subacute infection or poorly controlled allergies?    Delsa Grana, PA-C 01/17/20 10:53 AM

## 2020-01-18 LAB — COMPLETE METABOLIC PANEL WITH GFR
AG Ratio: 1.3 (calc) (ref 1.0–2.5)
ALT: 18 U/L (ref 9–46)
AST: 26 U/L (ref 10–35)
Albumin: 3.8 g/dL (ref 3.6–5.1)
Alkaline phosphatase (APISO): 91 U/L (ref 35–144)
BUN/Creatinine Ratio: 24 (calc) — ABNORMAL HIGH (ref 6–22)
BUN: 44 mg/dL — ABNORMAL HIGH (ref 7–25)
CO2: 20 mmol/L (ref 20–32)
Calcium: 9.4 mg/dL (ref 8.6–10.3)
Chloride: 102 mmol/L (ref 98–110)
Creat: 1.82 mg/dL — ABNORMAL HIGH (ref 0.70–1.33)
GFR, Est African American: 46 mL/min/{1.73_m2} — ABNORMAL LOW (ref >=60)
GFR, Est Non African American: 40 mL/min/{1.73_m2} — ABNORMAL LOW (ref >=60)
Globulin: 3 g/dL (calc) (ref 1.9–3.7)
Glucose, Bld: 94 mg/dL (ref 65–99)
Potassium: 4.3 mmol/L (ref 3.5–5.3)
Sodium: 137 mmol/L (ref 135–146)
Total Bilirubin: 1 mg/dL (ref 0.2–1.2)
Total Protein: 6.8 g/dL (ref 6.1–8.1)

## 2020-01-18 LAB — CBC WITH DIFFERENTIAL/PLATELET
Absolute Monocytes: 503 cells/uL (ref 200–950)
Basophils Absolute: 20 cells/uL (ref 0–200)
Basophils Relative: 0.3 %
Eosinophils Absolute: 27 cells/uL (ref 15–500)
Eosinophils Relative: 0.4 %
HCT: 48.9 % (ref 38.5–50.0)
Hemoglobin: 16.2 g/dL (ref 13.2–17.1)
Lymphs Abs: 469 cells/uL — ABNORMAL LOW (ref 850–3900)
MCH: 28.8 pg (ref 27.0–33.0)
MCHC: 33.1 g/dL (ref 32.0–36.0)
MCV: 86.9 fL (ref 80.0–100.0)
MPV: 12.2 fL (ref 7.5–12.5)
Monocytes Relative: 7.4 %
Neutro Abs: 5780 cells/uL (ref 1500–7800)
Neutrophils Relative %: 85 %
Platelets: 182 10*3/uL (ref 140–400)
RBC: 5.63 10*6/uL (ref 4.20–5.80)
RDW: 13.9 % (ref 11.0–15.0)
Total Lymphocyte: 6.9 %
WBC: 6.8 10*3/uL (ref 3.8–10.8)

## 2020-01-18 LAB — LIPID PANEL
Cholesterol: 227 mg/dL — ABNORMAL HIGH (ref <200)
HDL: 46 mg/dL (ref >=40)
LDL Cholesterol (Calc): 159 mg/dL (calc) — ABNORMAL HIGH
Non-HDL Cholesterol (Calc): 181 mg/dL (calc) — ABNORMAL HIGH (ref <130)
Total CHOL/HDL Ratio: 4.9 (calc) (ref <5.0)
Triglycerides: 108 mg/dL (ref <150)

## 2020-01-23 ENCOUNTER — Encounter: Payer: Self-pay | Admitting: Family Medicine

## 2020-01-24 ENCOUNTER — Encounter: Payer: Self-pay | Admitting: *Deleted

## 2020-02-20 DIAGNOSIS — B029 Zoster without complications: Secondary | ICD-10-CM | POA: Insufficient documentation

## 2020-02-20 HISTORY — DX: Zoster without complications: B02.9

## 2020-02-23 ENCOUNTER — Other Ambulatory Visit: Payer: Self-pay

## 2020-02-23 ENCOUNTER — Ambulatory Visit: Payer: Commercial Managed Care - HMO | Attending: Internal Medicine

## 2020-02-23 DIAGNOSIS — Z23 Encounter for immunization: Secondary | ICD-10-CM | POA: Insufficient documentation

## 2020-02-23 NOTE — Progress Notes (Signed)
   Covid-19 Vaccination Clinic  Name:  Jonathon Snow    MRN: [id number] DOB: 1961/01/13  02/23/2020  [name] was observed post Covid-19 immunization for 15 minutes without incident. He was provided with Vaccine Information Sheet and instruction to access the V-Safe system.   [name] was instructed to call 911 with any severe reactions post vaccine: [name] Difficulty breathing  . Swelling of face and throat  . A fast heartbeat  . A bad rash all over body  . Dizziness and weakness   Immunizations Administered    Name Date Dose VIS Date Route   Pfizer COVID-19 Vaccine 02/23/2020  9:45 AM 0.3 mL 12/02/2019 Intramuscular   Manufacturer: [place]   Lot: [id number]   [place]: [id number]

## 2020-02-28 ENCOUNTER — Telehealth: Payer: Self-pay

## 2020-02-28 ENCOUNTER — Other Ambulatory Visit: Payer: Self-pay | Admitting: Orthopaedic Surgery

## 2020-02-28 ENCOUNTER — Other Ambulatory Visit: Payer: Self-pay | Admitting: Family Medicine

## 2020-02-28 DIAGNOSIS — M329 Systemic lupus erythematosus, unspecified: Secondary | ICD-10-CM

## 2020-02-28 DIAGNOSIS — M3214 Glomerular disease in systemic lupus erythematosus: Secondary | ICD-10-CM

## 2020-02-28 DIAGNOSIS — M5416 Radiculopathy, lumbar region: Secondary | ICD-10-CM

## 2020-02-28 NOTE — Progress Notes (Signed)
Referral orders put in:  Systemic lupus erythematosus, unspecified SLE type, unspecified organ involvement status ([place]) - Plan: Ambulatory referral to Rheumatology  SLE glomerulonephritis syndrome ([place]) - Plan: Ambulatory referral to Rheumatology  Lupus nephritis [facility]) - Plan: Ambulatory referral to Rheumatology

## 2020-02-28 NOTE — Telephone Encounter (Signed)
Copied from [place] [id number]. Topic: Referral - Request for Referral >> Feb 28, 2020  9:48 AM Virl Axe D wrote: Has patient seen PCP for this complaint? No *If NO, is insurance requiring patient see PCP for this issue before PCP can refer them? Referral for which specialty: Rheumatology Preferred provider/office: Local office in [facility] Reason for referral: Lupus

## 2020-03-01 ENCOUNTER — Encounter: Payer: Self-pay | Admitting: Family Medicine

## 2020-03-01 ENCOUNTER — Ambulatory Visit: Payer: Commercial Managed Care - HMO | Attending: Internal Medicine

## 2020-03-01 ENCOUNTER — Telehealth (INDEPENDENT_AMBULATORY_CARE_PROVIDER_SITE_OTHER): Payer: Commercial Managed Care - HMO | Admitting: Family Medicine

## 2020-03-01 VITALS — Temp 97.6°F | Ht 75.0 in | Wt 250.0 lb

## 2020-03-01 DIAGNOSIS — D84821 Immunodeficiency due to drugs: Secondary | ICD-10-CM

## 2020-03-01 DIAGNOSIS — J310 Chronic rhinitis: Secondary | ICD-10-CM

## 2020-03-01 DIAGNOSIS — J329 Chronic sinusitis, unspecified: Secondary | ICD-10-CM

## 2020-03-01 DIAGNOSIS — M3214 Glomerular disease in systemic lupus erythematosus: Secondary | ICD-10-CM

## 2020-03-01 DIAGNOSIS — J069 Acute upper respiratory infection, unspecified: Secondary | ICD-10-CM

## 2020-03-01 DIAGNOSIS — Z20822 Contact with and (suspected) exposure to covid-19: Secondary | ICD-10-CM

## 2020-03-01 DIAGNOSIS — J302 Other seasonal allergic rhinitis: Secondary | ICD-10-CM | POA: Diagnosis not present

## 2020-03-01 DIAGNOSIS — Z79899 Other long term (current) drug therapy: Secondary | ICD-10-CM

## 2020-03-01 MED ORDER — MONTELUKAST SODIUM 10 MG PO TABS: 10.0000 mg | ORAL_TABLET | Freq: Every day | ORAL | 3 refills | Status: DC

## 2020-03-01 MED ORDER — AMOXICILLIN-POT CLAVULANATE 875-125 MG PO TABS: 1.0000 | ORAL_TABLET | Freq: Two times a day (BID) | ORAL | 0 refills | Status: AC

## 2020-03-01 NOTE — Progress Notes (Signed)
Name: Jonathon Snow   MRN: [id number]    DOB: June 20, 1961   Date:03/01/2020       Progress Note  Subjective:    Chief Complaint  Chief Complaint  Patient presents with  . Sinusitis    sinus pressure, nasal drainage and some cough due to drainage    I connected with  Lucienne Minks  on 03/01/20 at  9:00 AM EST by a video enabled telemedicine application and verified that I am speaking with the correct person using two identifiers.  I discussed the limitations of evaluation and management by telemedicine and the availability of in person appointments. The patient expressed understanding and agreed to proceed. Staff also discussed with the patient that there may be a patient responsible charge related to this service. Patient Location: home Provider Location: [facility] Additional Individuals present: none  HPI Pt presents with generalized illness sx and URI/allergy sx starting over the past week following COVID shot and some increased allergies, nasal drainage and nasal congestion COVID vax was about one week ago, developed fairly moderate to severe sx  where he felt like he had the flu a few days later, those sx body aches, HA resolved beginning of the week and then the last 4 days URI sx developed.  He complains of drainage, pressure, pain, postnasal drip, scratchy throat, congestion.  No HA, fever, sweats, cough, SOB, lymphadenopathy, malaise, N, V, change to appetite or energy - but it feels consistent with his Hx of recurrent sinusitis.  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    Patient Active Problem List   Diagnosis Date Noted  . Severe episode of recurrent major depressive disorder, without psychotic features ([place]) 09/12/2019  . Suspected COVID-19 virus infection 04/07/2019  . SLE glomerulonephritis syndrome ([place]) 02/16/2019  . Heel spur, left 02/16/2019  . Heel spur, right 02/16/2019  . 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 Osteoporosis 10/12/2018  . Chronic venous insufficiency 10/07/2018  . Lymphedema 10/07/2018  . Essential hypertension, benign 07/28/2018  . Lupus nephritis ([place]) 03/03/2018  . Opiate abuse, episodic ([place]) 02/26/2018  . Syncope 12/29/2017  . Postphlebitic syndrome with ulcer, left ([place]) 11/18/2016  . 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 History of colonoscopy with polypectomy 05/13/2016  . Hyperglycemia 04/17/2016  . Hyperlipidemia 01/15/2016  . Hypertension 01/15/2016  . Obesity (BMI 30.0-34.9) 01/15/2016  . Hematuria 10/04/2015  . 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[place]) 05/30/2015  . Abnormal presence of protein in urine 05/30/2015  . Pulmonary embolism ([place]) 05/30/2015  . COPD, moderate ([place]) 06/27/2014  . History of pulmonary embolism 12/11/2013  . Nodule of right lung 12/11/2013  . Right ankle fracture 12/10/2013  . 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 Calculus of kidney 08/21/2013    Social History   Tobacco Use  . Smoking  status: Never Smoker  . Smokeless tobacco: Never Used  Substance Use Topics  . Alcohol use: No     Current Outpatient Medications:  .  albuterol (VENTOLIN HFA) 108 (90 Base) MCG/ACT inhaler, Inhale 2 puffs into the lungs every 6 (six) hours as needed for wheezing or shortness of breath., Disp: 18 g, Rfl: 2 .   amLODipine (NORVASC) 5 MG tablet, Take 1 tablet (5 mg total) by mouth daily., Disp: 90 tablet, Rfl: 3 .  apixaban (ELIQUIS) 5 MG TABS tablet, Take 1 tablet (5 mg total) by mouth 2 (two) times daily., Disp: 60 tablet, Rfl: 2 .  atorvastatin (LIPITOR) 40 MG tablet, Take 1 tablet (40 mg total) by mouth at bedtime., Disp: 30 tablet, Rfl: 1 .  ipratropium-albuterol (DUONEB) 0.5-2.5 (3) MG/3ML SOLN, Take 3 mLs by nebulization every 4 (four) hours as needed., Disp: 360 mL, Rfl: 3 .  loratadine (CLARITIN) 10 MG tablet, Take 1 tablet (10 mg total) by mouth daily., Disp: 30 tablet, Rfl: 11 .  losartan (COZAAR) 100 MG tablet, Take 1 tablet (100 mg total) by mouth daily., Disp: 90 tablet, Rfl: 3 .  Multiple Vitamins-Minerals (MULTIVITAMIN WITH MINERALS) tablet, Take 1 tablet by mouth daily., Disp: , Rfl:  .  mycophenolate (CELLCEPT) 500 MG tablet, Take 1,000 mg by mouth 2 (two) times daily., Disp: , Rfl:  .  predniSONE (DELTASONE) 10 MG tablet, Take 10 mg by mouth daily with breakfast., Disp: , Rfl:  .  mometasone (NASONEX) 50 MCG/ACT nasal spray, PLACE 2 SPRAYS INTO THE NOSE DAILY. (2 SPRAYS EACH NOSTRIL) (Patient not taking: Reported on 03/01/2020), Disp: 1 g, Rfl: 11 .  montelukast (SINGULAIR) 10 MG tablet, Take 1 tablet (10 mg total) by mouth at bedtime. (Patient not taking: Reported on 03/01/2020), Disp: 30 tablet, Rfl: 1 .  triamcinolone ointment (KENALOG) 0.1 %, Apply 1 application topically 2 (two) times daily as needed. To affected rash/patch areas to leg for up to 1 week at a time (Patient not taking: Reported on 03/01/2020), Disp: 30 g, Rfl: 0  Allergies  Allergen Reactions  . Hydrocodone Rash  . Vicodin [Hydrocodone-Acetaminophen] Hives and Rash    Severe headaches (also)   I personally reviewed active problem list, medication list, allergies, family history, social history, health maintenance, notes from last encounter, lab results, imaging with the patient/caregiver today.   Review of Systems    10 Systems reviewed and are negative for acute change except as noted in the HPI.   Objective:   Virtual encounter, vitals limited, only able to obtain the following Today's Vitals   03/01/20 0920  Temp: 97.6 F (36.4 C)  Weight: 250 lb (113.4 kg)  Height: 6\' 3"  (1.905 m)   Body mass index is 31.25 kg/m. Nursing Note and Vital Signs reviewed.  Physical Exam Alert, non-toxic appearing, NAD, no visualized or audible respiratory distress, coughing wheeze or stridor, some minor throat clearing, phonation clear, no mild nasal congestion  PE limited by telephone encounter  No results found for this or any previous visit (from the past 72 hour(s)).  Assessment and Plan:     ICD-10-CM   1. Rhinosinusitis  J31.0 montelukast (SINGULAIR) 10 MG tablet   J32.9 amoxicillin-clavulanate (AUGMENTIN) 875-125 MG tablet   recurrent rhinosinusitis, recently had abx (Jan) will use augmentin this time, immunocompromised, non compliant with allergy/sinus meds  2. Seasonal allergies  J30.2 montelukast (SINGULAIR) 10 MG tablet   encouraged compliance to allergy control meds - get back on antihistamine, steroid nasal spray, use  saline spray, and get and take singulair  3. Upper respiratory tract infection, unspecified type  J06.9 Novel Coronavirus, NAA ([facility])   test for covid - isolate if positive  4. Immunocompromised state due to drug therapy  D84.821 amoxicillin-clavulanate (AUGMENTIN) 875-125 MG tablet   Z79.899    cellcept and prednisone - prone to sinusitis, longer abx duration     -Red flags and when to present for emergency care or RTC including fever >101.30F, chest pain, shortness of breath, new/worsening/un-resolving symptoms, reviewed with patient at time of visit. Follow up and care instructions discussed and provided in AVS. - I discussed the assessment and treatment plan with the patient. The patient was provided an opportunity to ask questions and all were answered. The patient  agreed with the plan and demonstrated an understanding of the instructions.  I provided 25 minutes of non-face-to-face time during this encounter.  Delsa Grana, PA-C 03/01/20 9:36 AM

## 2020-03-02 LAB — NOVEL CORONAVIRUS, NAA: SARS-CoV-2, NAA: NOT DETECTED

## 2020-03-15 ENCOUNTER — Ambulatory Visit: Payer: Commercial Managed Care - HMO | Attending: Internal Medicine

## 2020-03-15 DIAGNOSIS — Z23 Encounter for immunization: Secondary | ICD-10-CM

## 2020-03-15 NOTE — Progress Notes (Signed)
   Covid-19 Vaccination Clinic  Name:  Jonathon Snow    MRN: [id number] DOB: 1961-09-02  03/15/2020  [name] was observed post Covid-19 immunization for 15 minutes without incident. He was provided with Vaccine Information Sheet and instruction to access the V-Safe system.   [name] was instructed to call 911 with any severe reactions post vaccine: [name] Difficulty breathing  . Swelling of face and throat  . A fast heartbeat  . A bad rash all over body  . Dizziness and weakness   Immunizations Administered    Name Date Dose VIS Date Route   Pfizer COVID-19 Vaccine 03/15/2020  9:11 AM 0.3 mL 12/02/2019 Intramuscular   Manufacturer: [employer]   Lot: [id number]   [place]: [id number]

## 2020-03-19 NOTE — Telephone Encounter (Signed)
Copied from [place] [id number]. Topic: Referral - Question >> Mar 19, 2020  8:28 AM Rayann Heman wrote: Reason for CRM: pt called and stated that he now has new insurance this year. Pt went to see [facility] 3 weeks ago and discovered he needs a new referral because of insurance. Can we back date a referral. Please advise >> Mar 19, 2020  8:46 AM [name], [name] wrote: I called the pt and explained to him that his insurance does not have a PCP names on it from [place] and he will need to get that updated. He stated that, that has not been a issue in the past with his insurance.

## 2020-03-22 ENCOUNTER — Telehealth: Payer: Self-pay | Admitting: Family Medicine

## 2020-03-22 ENCOUNTER — Encounter: Payer: Self-pay | Admitting: Family Medicine

## 2020-03-22 ENCOUNTER — Other Ambulatory Visit: Payer: Commercial Managed Care - HMO

## 2020-03-22 ENCOUNTER — Other Ambulatory Visit: Payer: Self-pay | Admitting: Family Medicine

## 2020-03-22 DIAGNOSIS — Z01818 Encounter for other preprocedural examination: Secondary | ICD-10-CM

## 2020-03-22 DIAGNOSIS — Z7901 Long term (current) use of anticoagulants: Secondary | ICD-10-CM

## 2020-03-22 DIAGNOSIS — Z86718 Personal history of other venous thrombosis and embolism: Secondary | ICD-10-CM

## 2020-03-22 DIAGNOSIS — Z86711 Personal history of pulmonary embolism: Secondary | ICD-10-CM

## 2020-03-22 DIAGNOSIS — G08 Intracranial and intraspinal phlebitis and thrombophlebitis: Secondary | ICD-10-CM

## 2020-03-22 DIAGNOSIS — Z95828 Presence of other vascular implants and grafts: Secondary | ICD-10-CM | POA: Insufficient documentation

## 2020-03-22 HISTORY — DX: Personal history of other venous thrombosis and embolism: Z86.718

## 2020-03-22 HISTORY — DX: Presence of other vascular implants and grafts: Z95.828

## 2020-03-22 NOTE — Telephone Encounter (Signed)
Pt called and stated that he has a procedure for an injection for back pain on April 12. Pt states that he was advised to contact PCP because he would have to come off blood thinners 3 days prior to procedure. Pt would like to get the okay to do so. Please advise

## 2020-03-22 NOTE — Telephone Encounter (Signed)
Called pt states it was a hematologist/onocologogist at hospital who is no longer there.  He was place on due to multiple, DVT, PE.

## 2020-03-22 NOTE — Telephone Encounter (Signed)
Forms requested completion:  Patient had previously faxed in a procedure clearance request from spine and scoliosis specialist regarding 3-day hold on Eliquis prior to procedure for left lumbar nerve root block scheduled on 04/02/2020.  State that due to his medical history they require medical clearance prior to proceeding.  Patient recently presented to clinic requesting refills on his medications.  He had not been seen in a long time, over a year and his PCP had previously sent in only a few months of refills of his Eliquis at that time.  Patient has a history of IVC filter, PE, DVT, cerebral venous sinus thrombosis.  The first time it I can see Dr. Sanda Klein requested to refill eliquis was 06/2018.  She declined and told the patient to consult whoever was prescribing it for him, but somehow she ended up refilling it for him anyways.  Looking for the back patient was seen by hematology 2018-2019. Prior notes from Dr. Rogue Bussing repeatedly stated that patient needed his labs and office visit to get his medications refilled.  "To: Elouise Munroe, Cammie Sickle, MD Subject: RE: RX and R/S                                 Patient must be scheduled to see NP and labs. He will not get any more medications until he comes to this apt. If he fails to keep this apt, then he will receive a letter for practice dismissal. "  A subsequent note states that the patient was discharged from the practice for noncompliance.  After he was discharged from hematology and the refill request was refused in January 2019 there were several [place] walk-in clinic visits and in February patient was sent to the ER by Dr. Alroy Dust for concerns of DVT, patient presented to the ER and then left without being seen.  A few weeks later he was brought into the emergency room obtunded, unresponsive with agonal breathing, when EMS picked him up he was surrounded by bottles of pills and he had an admission to the hospital and later to  behavioral health through much of March 2019 -March 2019 he is Eliquis was prescribed to him by Dr. Posey Pronto (hospitalists - did discharge summary) with a few refills and later in July 2019 he sent a message to Dr. Sanda Klein for refills.   Jan this year he called requesting refills, was scheduled for visit 1/26 with me.   Will be writing letter to state that pt is high risk with his past med hx and dx of PE, DVT, cerebral venous sinus thrombosis, status post IVC filter -patient is high risk and should consult with hematology for procedure clearance request.  Since now have had more time to review his chart and see that he was previously discharged from Dr. Rogue Bussing will refer him to a different hematologist for consultation.    ICD-10-CM   1. History of pulmonary embolism  Z86.711 Ambulatory referral to Hematology  2. Cerebral venous sinus thrombosis  G08 Ambulatory referral to Hematology  3. History of DVT (deep vein thrombosis)  Z86.718 Ambulatory referral to Hematology  4. Long term current use of anticoagulant  Z79.01 Ambulatory referral to Hematology  5. Presence of IVC filter  Z95.828 Ambulatory referral to Hematology  6. Pre-op evaluation  Z01.818 Ambulatory referral to Hematology   Delsa Grana, PA-C

## 2020-03-22 NOTE — Telephone Encounter (Signed)
Can you call pt and ask who was managing eliquis  I saw for sinus issues and med refills Because he had not gone to any doctors for over a year and he was encouraged to go back to rheumatology abs nephrology etc  He has a history of DVT, PE and IVC filter and I'm not sure if it coagulopathy related to his lupus or if he still sees vascular?   All I can see is that Dr. Sanda Klein filled it 09/2018 for three months and that's  it?   Tell him that I have to figure out some of this info before I could even try to determine his risk and that will still have to be some kind of telephone encounter.  Thanks  Hopefully we'll get to it this week or early next week

## 2020-03-23 NOTE — Telephone Encounter (Signed)
Pt.notified

## 2020-04-05 ENCOUNTER — Other Ambulatory Visit: Payer: Self-pay

## 2020-04-05 ENCOUNTER — Telehealth: Payer: Self-pay | Admitting: Family Medicine

## 2020-04-05 DIAGNOSIS — Z95828 Presence of other vascular implants and grafts: Secondary | ICD-10-CM

## 2020-04-05 DIAGNOSIS — Z86711 Personal history of pulmonary embolism: Secondary | ICD-10-CM

## 2020-04-05 DIAGNOSIS — Z86718 Personal history of other venous thrombosis and embolism: Secondary | ICD-10-CM

## 2020-04-05 DIAGNOSIS — Z7901 Long term (current) use of anticoagulants: Secondary | ICD-10-CM

## 2020-04-05 DIAGNOSIS — G08 Intracranial and intraspinal phlebitis and thrombophlebitis: Secondary | ICD-10-CM

## 2020-04-05 NOTE — Telephone Encounter (Signed)
Copied from [place] [id number]. Topic: General - Other >> Apr 05, 2020  9:11 AM Keene Breath wrote: Reason for CRM: Patient called to ask that he get another referral for earlier than June, which is when the current referral specialist can see him.  He has to have a back procedure and needs a referral before then.  Please advise and call patient to see if he can be seen sooner.  CB# [phone]

## 2020-04-05 NOTE — Telephone Encounter (Signed)
New referral placed for [facility]

## 2020-04-12 ENCOUNTER — Telehealth: Payer: Self-pay

## 2020-04-12 NOTE — Telephone Encounter (Signed)
Please try to schedule him a virtual if possible if not he will need to go to urgent care

## 2020-04-12 NOTE — Telephone Encounter (Signed)
Copied from [place] [id number]. Topic: General - Inquiry >> Apr 11, 2020 10:51 AM Richardo Priest, NT wrote: Reason for CRM: Patient called in stating that he has had some food poisoning like symptoms. Stated it started Monday night with vomiting and diarrhea. Patient also experienced chills but at this time is only fatigued. Patient would like advise on what to do, as cannot get an appointment today. Please advise.

## 2020-04-16 ENCOUNTER — Telehealth: Payer: Self-pay | Admitting: Family Medicine

## 2020-04-16 NOTE — Telephone Encounter (Signed)
Pt states he is better now

## 2020-04-16 NOTE — Telephone Encounter (Signed)
Pt called and stated he had just had an appt at [facility] with a hematologist and was told that in regards to some blood work that he should call PCP and see if she would order a sleep study. Pt wants FU at [phone]. Does not wish to make appt.

## 2020-04-17 NOTE — Telephone Encounter (Signed)
Left detailed vm that if patient needed a referral it would need to be addressed in a visit note either by a virtual visit or he could discuss at next office visit.

## 2020-05-08 ENCOUNTER — Other Ambulatory Visit: Payer: Self-pay

## 2020-05-08 ENCOUNTER — Telehealth (INDEPENDENT_AMBULATORY_CARE_PROVIDER_SITE_OTHER): Payer: Self-pay | Admitting: Gastroenterology

## 2020-05-08 ENCOUNTER — Other Ambulatory Visit: Payer: Self-pay | Admitting: Gastroenterology

## 2020-05-08 DIAGNOSIS — Z1211 Encounter for screening for malignant neoplasm of colon: Secondary | ICD-10-CM

## 2020-05-08 MED ORDER — NA SULFATE-K SULFATE-MG SULF 17.5-3.13-1.6 GM/177ML PO SOLN: 1.0000 | Freq: Once | ORAL | 0 refills | Status: AC

## 2020-05-08 NOTE — Progress Notes (Signed)
Gastroenterology Pre-Procedure Review  Request Date: Friday 05/18/20 Requesting Physician: Dr. Bonna Gains  PATIENT REVIEW QUESTIONS: The patient responded to the following health history questions as indicated:    1. Are you having any GI issues? yes (heartburn-currently taking medication for this) 2. Do you have a personal history of Polyps? no 3. Do you have a family history of Colon Cancer or Polyps? no 4. Diabetes Mellitus? no 5. Joint replacements in the past 12 months?no 6. Major health problems in the past 3 months?no 7. Any artificial heart valves, MVP, or defibrillator?no    MEDICATIONS & ALLERGIES:    Patient reports the following regarding taking any anticoagulation/antiplatelet therapy:   Plavix, Coumadin, Eliquis, Xarelto, Lovenox, Pradaxa, Brilinta, or Effient? yes (Eliquis-Blood thinner request sent to Dr. Dionne Milo.) Aspirin? no  Patient confirms/reports the following medications:  Current Outpatient Medications  Medication Sig Dispense Refill  . albuterol (VENTOLIN HFA) 108 (90 Base) MCG/ACT inhaler Inhale 2 puffs into the lungs every 6 (six) hours as needed for wheezing or shortness of breath. 18 g 2  . amLODipine (NORVASC) 5 MG tablet Take 1 tablet (5 mg total) by mouth daily. 90 tablet 3  . apixaban (ELIQUIS) 5 MG TABS tablet Take 1 tablet (5 mg total) by mouth 2 (two) times daily. 60 tablet 2  . atorvastatin (LIPITOR) 40 MG tablet Take 1 tablet (40 mg total) by mouth at bedtime. 30 tablet 1  . budesonide (PULMICORT) 0.5 MG/2ML nebulizer solution Inhale into the lungs.    [name] ipratropium-albuterol (DUONEB) 0.5-2.5 (3) MG/3ML SOLN Take 3 mLs by nebulization every 4 (four) hours as needed. 360 mL 3  . losartan (COZAAR) 100 MG tablet Take 1 tablet (100 mg total) by mouth daily. 90 tablet 3  . montelukast (SINGULAIR) 10 MG tablet Take 1 tablet (10 mg total) by mouth at bedtime. 90 tablet 3  . Multiple Vitamins-Minerals (MULTIVITAMIN WITH MINERALS) tablet Take 1 tablet by mouth  daily.    . mycophenolate (CELLCEPT) 500 MG tablet Take 1,000 mg by mouth 2 (two) times daily.    . predniSONE (DELTASONE) 10 MG tablet Take 10 mg by mouth daily with breakfast.    . loratadine (CLARITIN) 10 MG tablet Take 1 tablet (10 mg total) by mouth daily. (Patient not taking: Reported on 05/08/2020) 30 tablet 11  . Mepolizumab (NUCALA) 100 MG/ML SOAJ Inject into the skin.    . mometasone (NASONEX) 50 MCG/ACT nasal spray PLACE 2 SPRAYS INTO THE NOSE DAILY. (2 SPRAYS EACH NOSTRIL) (Patient not taking: Reported on 03/01/2020) 1 g 11  . triamcinolone ointment (KENALOG) 0.1 % Apply 1 application topically 2 (two) times daily as needed. To affected rash/patch areas to leg for up to 1 week at a time (Patient not taking: Reported on 03/01/2020) 30 g 0   No current facility-administered medications for this visit.    Patient confirms/reports the following allergies:  Allergies  Allergen Reactions  . Hydrocodone Rash  . Vicodin [Hydrocodone-Acetaminophen] Hives and Rash    Severe headaches (also)    No orders of the defined types were placed in this encounter.   AUTHORIZATION INFORMATION Primary Insurance: 1D#: Group #:  Secondary Insurance: 1D#: Group #:  SCHEDULE INFORMATION: Date: Friday 05/18/20 Time: Location:[facility]

## 2020-05-09 ENCOUNTER — Telehealth: Payer: Self-pay

## 2020-05-09 ENCOUNTER — Other Ambulatory Visit: Payer: Self-pay

## 2020-05-09 DIAGNOSIS — Z1211 Encounter for screening for malignant neoplasm of colon: Secondary | ICD-10-CM

## 2020-05-09 MED ORDER — PEG 3350-KCL-NA BICARB-NACL 420 G PO SOLR: 4000.0000 mL | Freq: Once | ORAL | 0 refills | Status: AC

## 2020-05-09 NOTE — Telephone Encounter (Signed)
Patient has been advised per Dr. Misty Stanley blood thinner advice to stop Eliquis 2 days prior to colonoscopy.  Restart 1 day after procedure.  Patient verbalized understanding.  Thanks,  [place], [place]

## 2020-05-09 NOTE — Telephone Encounter (Signed)
Bowel prep has been changed to [employer].  Patient has been notified of rx change.  Pt has been instructed to follow the preparation instructions for mixing.  Begin drinking 8 oz every 30 minutes starting at 5pm.  Nothing to eat or drink 4 hours prior to procedure.  Thanks,  [place], [place]

## 2020-05-24 ENCOUNTER — Other Ambulatory Visit
Admission: RE | Admit: 2020-05-24 | Discharge: 2020-05-24 | Disposition: A | Discharge status: Home / Self Care | Payer: Commercial Managed Care - HMO | Source: Ambulatory Visit | Attending: Gastroenterology | Admitting: Gastroenterology

## 2020-05-24 ENCOUNTER — Other Ambulatory Visit: Payer: Self-pay

## 2020-05-24 DIAGNOSIS — Z20822 Contact with and (suspected) exposure to covid-19: Secondary | ICD-10-CM | POA: Insufficient documentation

## 2020-05-24 DIAGNOSIS — Z01812 Encounter for preprocedural laboratory examination: Secondary | ICD-10-CM | POA: Diagnosis not present

## 2020-05-24 LAB — SARS CORONAVIRUS 2 (TAT 6-24 HRS): SARS Coronavirus 2: NEGATIVE

## 2020-05-28 ENCOUNTER — Ambulatory Visit: Payer: Commercial Managed Care - HMO | Admitting: Registered Nurse

## 2020-05-28 ENCOUNTER — Other Ambulatory Visit: Payer: Self-pay

## 2020-05-28 ENCOUNTER — Encounter
Admission: RE | Disposition: A | Discharge status: Home / Self Care | Payer: Self-pay | Source: Home / Self Care | Attending: Gastroenterology

## 2020-05-28 ENCOUNTER — Ambulatory Visit
Admission: RE | Admit: 2020-05-28 | Discharge: 2020-05-28 | Disposition: A | Discharge status: Home / Self Care | Payer: Commercial Managed Care - HMO | Attending: Gastroenterology | Admitting: Gastroenterology

## 2020-05-28 DIAGNOSIS — I129 Hypertensive chronic kidney disease with stage 1 through stage 4 chronic kidney disease, or unspecified chronic kidney disease: Secondary | ICD-10-CM | POA: Insufficient documentation

## 2020-05-28 DIAGNOSIS — Z885 Allergy status to narcotic agent status: Secondary | ICD-10-CM | POA: Diagnosis not present

## 2020-05-28 DIAGNOSIS — M329 Systemic lupus erythematosus, unspecified: Secondary | ICD-10-CM | POA: Insufficient documentation

## 2020-05-28 DIAGNOSIS — N183 Chronic kidney disease, stage 3 unspecified: Secondary | ICD-10-CM | POA: Insufficient documentation

## 2020-05-28 DIAGNOSIS — Z7901 Long term (current) use of anticoagulants: Secondary | ICD-10-CM | POA: Diagnosis not present

## 2020-05-28 DIAGNOSIS — J449 Chronic obstructive pulmonary disease, unspecified: Secondary | ICD-10-CM | POA: Insufficient documentation

## 2020-05-28 DIAGNOSIS — D122 Benign neoplasm of ascending colon: Secondary | ICD-10-CM | POA: Insufficient documentation

## 2020-05-28 DIAGNOSIS — D120 Benign neoplasm of cecum: Secondary | ICD-10-CM | POA: Diagnosis not present

## 2020-05-28 DIAGNOSIS — E785 Hyperlipidemia, unspecified: Secondary | ICD-10-CM | POA: Insufficient documentation

## 2020-05-28 DIAGNOSIS — Z86718 Personal history of other venous thrombosis and embolism: Secondary | ICD-10-CM | POA: Insufficient documentation

## 2020-05-28 DIAGNOSIS — Z79899 Other long term (current) drug therapy: Secondary | ICD-10-CM | POA: Insufficient documentation

## 2020-05-28 DIAGNOSIS — Z86711 Personal history of pulmonary embolism: Secondary | ICD-10-CM | POA: Insufficient documentation

## 2020-05-28 DIAGNOSIS — D126 Benign neoplasm of colon, unspecified: Secondary | ICD-10-CM

## 2020-05-28 DIAGNOSIS — Z1211 Encounter for screening for malignant neoplasm of colon: Secondary | ICD-10-CM | POA: Insufficient documentation

## 2020-05-28 DIAGNOSIS — K5730 Diverticulosis of large intestine without perforation or abscess without bleeding: Secondary | ICD-10-CM | POA: Diagnosis not present

## 2020-05-28 DIAGNOSIS — Z7952 Long term (current) use of systemic steroids: Secondary | ICD-10-CM | POA: Diagnosis not present

## 2020-05-28 DIAGNOSIS — D125 Benign neoplasm of sigmoid colon: Secondary | ICD-10-CM | POA: Diagnosis not present

## 2020-05-28 DIAGNOSIS — Z8249 Family history of ischemic heart disease and other diseases of the circulatory system: Secondary | ICD-10-CM | POA: Insufficient documentation

## 2020-05-28 HISTORY — PX: COLONOSCOPY WITH PROPOFOL: SHX5780

## 2020-05-28 SURGERY — COLONOSCOPY WITH PROPOFOL
Anesthesia: General

## 2020-05-28 MED ORDER — SODIUM CHLORIDE 0.9 % IV SOLN
INTRAVENOUS | Status: DC
  Administered 2020-05-28: 1000 mL via INTRAVENOUS

## 2020-05-28 MED ORDER — PROPOFOL 500 MG/50ML IV EMUL
INTRAVENOUS | Status: DC | PRN
  Administered 2020-05-28: 140 ug/kg/min via INTRAVENOUS

## 2020-05-28 MED ORDER — LIDOCAINE HCL (CARDIAC) PF 100 MG/5ML IV SOSY
PREFILLED_SYRINGE | INTRAVENOUS | Status: DC | PRN
  Administered 2020-05-28: 100 mg via INTRAVENOUS

## 2020-05-28 MED ORDER — PROPOFOL 10 MG/ML IV BOLUS
INTRAVENOUS | Status: DC | PRN
  Administered 2020-05-28: 100 mg via INTRAVENOUS

## 2020-05-28 NOTE — Anesthesia Postprocedure Evaluation (Signed)
Anesthesia Post Note  Patient: Jonathon Snow  Procedure(s) Performed: COLONOSCOPY WITH PROPOFOL (N/A )  Patient location during evaluation: PACU Anesthesia Type: General Level of consciousness: awake and alert Pain management: pain level controlled Vital Signs Assessment: post-procedure vital signs reviewed and stable Respiratory status: spontaneous breathing, nonlabored ventilation, respiratory function stable and patient connected to nasal cannula oxygen Cardiovascular status: blood pressure returned to baseline and stable Postop Assessment: no apparent nausea or vomiting Anesthetic complications: no     Last Vitals:  Vitals:   05/28/20 0920 05/28/20 1040  BP: (!) 141/98 (!) 147/97  Pulse: 87 82  Resp: 18 14  Temp: (!) 36 C (!) 36 C  SpO2: 100% 97%    Last Pain:  Vitals:   05/28/20 1040  TempSrc: Temporal  PainSc:                  Molli Barrows

## 2020-05-28 NOTE — Op Note (Signed)
[facility] Gastroenterology Patient Name: Jonathon Snow Procedure Date: 05/28/2020 10:10 AM MRN: [id number] Account #: [id number] Date of Birth: December 27, 1960 Admit Type: Outpatient Age: 59 Room: [facility] ENDO ROOM 4 Gender: Male Note Status: Finalized Procedure:             Colonoscopy Indications:           Screening for colorectal malignant neoplasm Providers:             Jonathon Bellows MD, MD Medicines:             Monitored Anesthesia Care Complications:         No immediate complications. Procedure:             Pre-Anesthesia Assessment:                        - Prior to the procedure, a History and Physical was                         performed, and patient medications, allergies and                         sensitivities were reviewed. The patient's tolerance                         of previous anesthesia was reviewed.                        - The risks and benefits of the procedure and the                         sedation options and risks were discussed with the                         patient. All questions were answered and informed                         consent was obtained.                        - ASA Grade Assessment: II - A patient with mild                         systemic disease.                        After obtaining informed consent, the colonoscope was                         passed under direct vision. Throughout the procedure,                         the patient's blood pressure, pulse, and oxygen                         saturations were monitored continuously. The                         Colonoscope was introduced through the anus and  advanced to the the cecum, identified by the                         appendiceal orifice. The colonoscopy was performed                         with ease. The patient tolerated the procedure well.                         The quality of the bowel preparation was good. Findings:      The  perianal and digital rectal examinations were normal.      Multiple small-mouthed diverticula were found in the sigmoid colon.      Two sessile polyps were found in the ascending colon and cecum. The       polyps were 4 to 6 mm in size. These polyps were removed with a cold       snare. Resection and retrieval were complete.      A 10 mm polyp was found in the sigmoid colon. The polyp was       semi-pedunculated. The polyp was removed with a cold snare. Resection       and retrieval were complete. To prevent bleeding after the polypectomy,       two hemostatic clips were successfully placed. There was no bleeding       during, or at the end, of the procedure.      The exam was otherwise without abnormality on direct and retroflexion       views. Impression:            - Diverticulosis in the sigmoid colon.                        - Two 4 to 6 mm polyps in the ascending colon and in                         the cecum, removed with a cold snare. Resected and                         retrieved.                        - One 10 mm polyp in the sigmoid colon, removed with a                         cold snare. Resected and retrieved. Clips were placed.                        - The examination was otherwise normal on direct and                         retroflexion views. Recommendation:        - Discharge patient to home (with escort).                        - Resume previous diet.                        - Continue present medications.                        -  Await pathology results.                        - Repeat colonoscopy in 3 years for surveillance. Procedure Code(s):     --- Professional ---                        [phone], Colonoscopy, flexible; with removal of                         tumor(s), polyp(s), or other lesion(s) by snare                         technique Diagnosis Code(s):     --- Professional ---                        K63.5, Polyp of colon                        Z12.11,  Encounter for screening for malignant neoplasm                         of colon                        K57.30, Diverticulosis of large intestine without                         perforation or abscess without bleeding CPT copyright 2019 American Medical Association. All rights reserved. The codes documented in this report are preliminary and upon coder review may  be revised to meet current compliance requirements. Jonathon Bellows, MD Jonathon Bellows MD, MD 05/28/2020 10:39:59 AM This report has been signed electronically. Number of Addenda: 0 Note Initiated On: 05/28/2020 10:10 AM Scope Withdrawal Time: 0 hours 14 minutes 45 seconds  Total Procedure Duration: 0 hours 20 minutes 35 seconds  Estimated Blood Loss:  Estimated blood loss: none.      [facility]

## 2020-05-28 NOTE — Transfer of Care (Signed)
Immediate Anesthesia Transfer of Care Note  Patient: Jonathon Snow  Procedure(s) Performed: COLONOSCOPY WITH PROPOFOL (N/A )  Patient Location: PACU  Anesthesia Type:General  Level of Consciousness: sedated  Airway & Oxygen Therapy: Patient Spontanous Breathing  Post-op Assessment: Report given to RN and Post -op Vital signs reviewed and stable  Post vital signs: Reviewed and stable  Last Vitals:  Vitals Value Taken Time  BP 147/97 05/28/20 1042  Temp    Pulse 87 05/28/20 1042  Resp 6 05/28/20 1042  SpO2 96 % 05/28/20 1042  Vitals shown include unvalidated device data.  Last Pain:  Vitals:   05/28/20 0920  TempSrc: Temporal  PainSc: 6          Complications: No apparent anesthesia complications

## 2020-05-28 NOTE — H&P (Signed)
Jonathon Bellows, MD [address] [place], [place], [place], [address] [address], [place], [place], [place], [address] Phone: [phone]  Fax: [phone]  Primary Care Physician:  Delsa Grana, PA-C   Pre-Procedure History & Physical: HPI:  Jonathon Snow is a 59 y.o. male is here for an colonoscopy.   Past Medical History:  Diagnosis Date  . Depression   . DVT (deep venous thrombosis) ([place])   . GERD (gastroesophageal reflux disease)   . Hyperlipidemia   . Hypertension   . Lupus ([place])   . Osteoporosis   . Pulmonary embolism ([place])   . Renal disorder    Stage III    Past Surgical History:  Procedure Laterality Date  . ANKLE SURGERY Right   . CYST EXCISION  92 or 93    Liver cyst removal [facility]  . I & D EXTREMITY Right 04/29/2017   Procedure: IRRIGATION AND DEBRIDEMENT EXTREMITY;  Surgeon: Clayburn Pert, MD;  Location: [facility] ORS;  Service: General;  Laterality: Right;  . IRRIGATION AND DEBRIDEMENT ABSCESS Left 04/29/2017   Procedure: IRRIGATION AND DEBRIDEMENT Scrotal ABSCESS;  Surgeon: Clayburn Pert, MD;  Location: [facility] ORS;  Service: General;  Laterality: Left;    Prior to Admission medications   Medication Sig Start Date End Date Taking? Authorizing Provider  albuterol (VENTOLIN HFA) 108 (90 Base) MCG/ACT inhaler Inhale 2 puffs into the lungs every 6 (six) hours as needed for wheezing or shortness of breath. 01/17/20  Yes Delsa Grana, PA-C  amLODipine (NORVASC) 5 MG tablet Take 1 tablet (5 mg total) by mouth daily. 01/17/20  Yes Delsa Grana, PA-C  apixaban (ELIQUIS) 5 MG TABS tablet Take 1 tablet (5 mg total) by mouth 2 (two) times daily. 01/17/20  Yes Delsa Grana, PA-C  atorvastatin (LIPITOR) 40 MG tablet Take 1 tablet (40 mg total) by mouth at bedtime. 11/16/18  Yes [name], [name], MD  budesonide (PULMICORT) 0.5 MG/2ML nebulizer solution Inhale into the lungs. 05/02/20 05/02/21 Yes [provider]  ipratropium-albuterol (DUONEB) 0.5-2.5 (3) MG/3ML SOLN  Take 3 mLs by nebulization every 4 (four) hours as needed. 01/17/20  Yes Delsa Grana, PA-C  loratadine (CLARITIN) 10 MG tablet Take 1 tablet (10 mg total) by mouth daily. 01/17/20  Yes Delsa Grana, PA-C  losartan (COZAAR) 100 MG tablet Take 1 tablet (100 mg total) by mouth daily. 01/17/20  Yes Delsa Grana, PA-C  Mepolizumab (NUCALA) 100 MG/ML SOAJ Inject into the skin. 05/02/20  Yes [provider]  mometasone (NASONEX) 50 MCG/ACT nasal spray PLACE 2 SPRAYS INTO THE NOSE DAILY. (2 SPRAYS EACH NOSTRIL) 10/12/18  Yes Poulose, Bethel Born, NP  montelukast (SINGULAIR) 10 MG tablet Take 1 tablet (10 mg total) by mouth at bedtime. 03/01/20  Yes Delsa Grana, PA-C  Multiple Vitamins-Minerals (MULTIVITAMIN WITH MINERALS) tablet Take 1 tablet by mouth daily.   Yes [provider]  mycophenolate (CELLCEPT) 500 MG tablet Take 1,000 mg by mouth 2 (two) times daily.   Yes [provider]  predniSONE (DELTASONE) 10 MG tablet Take 10 mg by mouth daily with breakfast.   Yes Dagoberto Ligas, MD  triamcinolone ointment (KENALOG) 0.1 % Apply 1 application topically 2 (two) times daily as needed. To affected rash/patch areas to leg for up to 1 week at a time 01/17/20  Yes Delsa Grana, PA-C    Allergies as of 05/08/2020 - Review Complete 05/08/2020  Allergen Reaction Noted  . Hydrocodone Rash 08/25/2013  . Vicodin [hydrocodone-acetaminophen] Hives and Rash 12/09/2013  Family History  Problem Relation Age of Onset  . Hypertension Father   . Heart disease Father   . Clotting disorder Mother   . Kidney disease Brother   . Heart attack Maternal Grandmother   . Heart attack Maternal Grandfather   . Heart attack Paternal Grandfather     Social History   Socioeconomic History  . Marital status: Divorced    Spouse name: Not on file  . Number of children: Not on file  . Years of education: Not on file  . Highest education level: Not on file  Occupational History  . Not on file    Tobacco Use  . Smoking status: Never Smoker  . Smokeless tobacco: Never Used  Substance and Sexual Activity  . Alcohol use: No  . Drug use: No  . Sexual activity: Never  Other Topics Concern  . Not on file  Social History Narrative  . Not on file   Social Determinants of Health   Financial Resource Strain:   . Difficulty of Paying Living Expenses:   Food Insecurity:   . Worried About Charity fundraiser in the Last Year:   . Arboriculturist in the Last Year:   Transportation Needs:   . Film/video editor (Medical):   [name] Lack of Transportation (Non-Medical):   Physical Activity:   . Days of Exercise per Week:   . Minutes of Exercise per Session:   Stress:   . Feeling of Stress :   Social Connections:   . Frequency of Communication with Friends and Family:   . Frequency of Social Gatherings with Friends and Family:   . Attends Religious Services:   . Active Member of Clubs or Organizations:   . Attends Archivist Meetings:   [name] Marital Status:   Intimate Partner Violence:   . Fear of Current or Ex-Partner:   . Emotionally Abused:   [name] Physically Abused:   . Sexually Abused:     Review of Systems: See HPI, otherwise negative ROS  Physical Exam: BP (!) 141/98   Pulse 87   Temp (!) 96.8 F (36 C) (Temporal)   Resp 18   Ht 6\' 3"  (1.905 m)   Wt 115.7 kg   SpO2 100%   BMI 31.87 kg/m  General:   Alert,  pleasant and cooperative in NAD Head:  Normocephalic and atraumatic. Neck:  Supple; no masses or thyromegaly. Lungs:  Clear throughout to auscultation, normal respiratory effort.    Heart:  +S1, +S2, Regular rate and rhythm, No edema. Abdomen:  Soft, nontender and nondistended. Normal bowel sounds, without guarding, and without rebound.   Neurologic:  Alert and  oriented x4;  grossly normal neurologically.  Impression/Plan: Jonathon Snow is here for an colonoscopy to be performed for Screening colonoscopy average risk   Risks, benefits,  limitations, and alternatives regarding  colonoscopy have been reviewed with the patient.  Questions have been answered.  All parties agreeable.   Jonathon Bellows, MD  05/28/2020, 10:07 AM

## 2020-05-28 NOTE — Anesthesia Preprocedure Evaluation (Signed)
Anesthesia Evaluation  Patient identified by MRN, date of birth, ID band Patient awake    Reviewed: Allergy & Precautions, H&P , NPO status , Patient's Chart, lab work & pertinent test results, reviewed documented beta blocker date and time   Airway Mallampati: II   Neck ROM: full    Dental  (+) Poor Dentition   Pulmonary neg pulmonary ROS, COPD,  COPD inhaler,    Pulmonary exam normal        Cardiovascular Exercise Tolerance: Good hypertension, On Medications negative cardio ROS Normal cardiovascular exam Rhythm:regular Rate:Normal     Neuro/Psych PSYCHIATRIC DISORDERS Depression negative neurological ROS  negative psych ROS   GI/Hepatic negative GI ROS, Neg liver ROS, GERD  Medicated,  Endo/Other  negative endocrine ROS  Renal/GU Renal diseasenegative Renal ROS  negative genitourinary   Musculoskeletal   Abdominal   Peds  Hematology negative hematology ROS (+)   Anesthesia Other Findings Past Medical History: No date: Depression No date: DVT (deep venous thrombosis) ([facility]) No date: GERD (gastroesophageal reflux disease) No date: Hyperlipidemia No date: Hypertension No date: Lupus ([place]) No date: Osteoporosis No date: Pulmonary embolism ([facility]) No date: Renal disorder     Comment:  Stage III Past Surgical History: No date: ANKLE SURGERY; Right 92 or 93 : CYST EXCISION     Comment:  Liver cyst removal [facility] 04/29/2017: I & D EXTREMITY; Right     Comment:  Procedure: IRRIGATION AND DEBRIDEMENT EXTREMITY;                Surgeon: Clayburn Pert, MD;  Location: [facility] ORS;                Service: General;  Laterality: Right; 04/29/2017: IRRIGATION AND DEBRIDEMENT ABSCESS; Left     Comment:  Procedure: IRRIGATION AND DEBRIDEMENT Scrotal ABSCESS;                Surgeon: Clayburn Pert, MD;  Location: [facility] ORS;                Service: General;  Laterality: Left; BMI    Body Mass Index: 31.87 kg/m     Reproductive/Obstetrics negative OB ROS                             Anesthesia Physical Anesthesia Plan  ASA: III  Anesthesia Plan: General   Post-op Pain Management:    Induction:   PONV Risk Score and Plan:   Airway Management Planned:   Additional Equipment:   Intra-op Plan:   Post-operative Plan:   Informed Consent: I have reviewed the patients History and Physical, chart, labs and discussed the procedure including the risks, benefits and alternatives for the proposed anesthesia with the patient or authorized representative who has indicated his/her understanding and acceptance.     Dental Advisory Given  Plan Discussed with: CRNA  Anesthesia Plan Comments:         Anesthesia Quick Evaluation

## 2020-05-29 ENCOUNTER — Encounter: Payer: Self-pay | Admitting: *Deleted

## 2020-05-29 LAB — SURGICAL PATHOLOGY

## 2020-06-04 ENCOUNTER — Encounter: Payer: Self-pay | Admitting: Gastroenterology

## 2020-06-14 ENCOUNTER — Other Ambulatory Visit: Payer: Self-pay

## 2020-06-14 ENCOUNTER — Ambulatory Visit: Payer: Commercial Managed Care - HMO | Admitting: Dermatology

## 2020-06-14 DIAGNOSIS — L720 Epidermal cyst: Secondary | ICD-10-CM

## 2020-06-14 NOTE — Progress Notes (Addendum)
   Follow-Up Visit   Subjective  Jonathon Snow is a 58 y.o. male who presents for the following: check spot (R lateral brow, growing, has had ~75yr) and check spot (L lat brow, irritating, has had ~2yr).   The following portions of the chart were reviewed this encounter and updated as appropriate:  Tobacco  Allergies  Meds  Problems  Med Hx  Surg Hx  Fam Hx      Review of Systems:  No other skin or systemic complaints except as noted in HPI or Assessment and Plan.  Objective  Well appearing patient in no apparent distress; mood and affect are within normal limits.  A focused examination was performed including face. Relevant physical exam findings are noted in the Assessment and Plan.  Objective  L lat brow x 1, R lat brow x 1: Smooth white papule(s).    Assessment & Plan    Milia L lat brow x 1, R lat brow x 1  Extraction x 2 today  Acne/Milia surgery - L lat brow x 1, R lat brow x 1 Procedure risks and benefits were discussed with the patient and verbal consent was obtained. Following prep of the skin on the L lat brow and R lat brow with an alcohol swab, extraction of milia was performed with cotton tip applicators following superficial incision made over their surfaces with a #15 surgical blade. Capillary hemostasis was achieved with 20% aluminum chloride solution. Vaseline ointment was applied to each site. The patient tolerated the procedure well.  Return for PRN.   I, Othelia Pulling, RMA, am acting as scribe for Sarina Ser, MD .  Documentation: I have reviewed the above documentation for accuracy and completeness, and I agree with the above.  Sarina Ser, MD

## 2020-06-19 ENCOUNTER — Telehealth (INDEPENDENT_AMBULATORY_CARE_PROVIDER_SITE_OTHER): Payer: Self-pay

## 2020-06-19 ENCOUNTER — Encounter: Payer: Self-pay | Admitting: Dermatology

## 2020-06-19 NOTE — Telephone Encounter (Signed)
The pt called and left a message on the voice mail and want to schedule an appointment with Dr.  Lucky Cowboy to have a discussion about removing his filter the pt has a Hx of blood clots.

## 2020-06-19 NOTE — Telephone Encounter (Signed)
That's fine, you can put him on for appointment with dew no studies

## 2020-06-19 NOTE — Telephone Encounter (Signed)
Can you please schedule the pt per the NP. 

## 2020-06-27 NOTE — Addendum Note (Signed)
Addended by: Larence Penning on: 06/27/2020 04:30 PM   Modules accepted: Orders

## 2020-07-10 ENCOUNTER — Ambulatory Visit (INDEPENDENT_AMBULATORY_CARE_PROVIDER_SITE_OTHER): Payer: PRIVATE HEALTH INSURANCE | Admitting: Vascular Surgery

## 2020-08-02 ENCOUNTER — Telehealth: Payer: Self-pay

## 2020-08-02 NOTE — Telephone Encounter (Signed)
Copied from [place] [phone]. Topic: General - Inquiry >> Aug 02, 2020  1:13 PM Alease Frame wrote: Reason for CRM: Ronalee Belts from [facility] calling in reference to a referral . Pt has an appt there on [phone] in radiology dept provider [facility].  I'm unsure what this referral is regarding since all other referrals has been closed out and I have no idea who his PCP is since there is none listed with no upcoming appts.

## 2020-08-07 ENCOUNTER — Ambulatory Visit (INDEPENDENT_AMBULATORY_CARE_PROVIDER_SITE_OTHER): Payer: PRIVATE HEALTH INSURANCE | Admitting: Vascular Surgery

## 2020-08-08 ENCOUNTER — Telehealth: Payer: Self-pay

## 2020-08-08 NOTE — Telephone Encounter (Signed)
Can we do this ?

## 2020-08-08 NOTE — Telephone Encounter (Signed)
I don't think so since the orders are time stamped when the referral is placed, but I will send this to Larkin Ina to see if he can give a definitive answer.

## 2020-08-08 NOTE — Telephone Encounter (Signed)
Copied from [place] [id number]. Topic: Referral - Status >> Aug 08, 2020  1:53 PM Yvette Rack wrote: Reason for CRM: Pt stated he has been going to [facility] in [place] since 2014 and he needs a back dated referral sent to them. Pt asked that the referral be back dated for Apr 25, 2020.

## 2020-08-09 NOTE — Telephone Encounter (Signed)
Jonathon Snow [facility] 08/08/2020 4:51 PM Yes the referral can be back dated if you create it manually. Even if the referral is created via order, you should be able to change the start date of the referral on the Authorization page. Does that help?

## 2020-08-10 NOTE — Telephone Encounter (Signed)
I dont understand? Do you know?

## 2020-08-17 ENCOUNTER — Ambulatory Visit: Payer: Commercial Managed Care - HMO

## 2020-08-28 ENCOUNTER — Ambulatory Visit: Payer: Commercial Managed Care - HMO

## 2020-09-05 ENCOUNTER — Ambulatory Visit: Payer: Commercial Managed Care - HMO | Admitting: Dermatology

## 2020-09-19 ENCOUNTER — Telehealth: Payer: Self-pay

## 2020-09-19 NOTE — Telephone Encounter (Signed)
Copied from [place] [id number]. Topic: Referral - Request for Referral >> Aug 02, 2020  9:15 AM Alanda Slim E wrote: Has patient seen PCP for this complaint? Yes  *If NO, is insurance requiring patient see PCP for this issue before PCP can refer them? Referral for which specialty: yes Preferred provider/office: [facility] / Dr. Holley Raring in [place]  Reason for referral: lupus and kidney disease / needs asap and dated back to May 5th. Pt asked for this referral in May but they never received it / please advise asap when sent   Please place a referral to [place] ([place]) as an external referral so that it can be backed dated (04/25/20).

## 2020-09-21 NOTE — Telephone Encounter (Signed)
Patient has been seen by his nephrologist on 3/1, 7/8, and an upcoming visit on 10/11 according to [facility]

## 2020-09-21 NOTE — Telephone Encounter (Signed)
I have seen this man one time in January for sinus issues. We put in referral to rheumatology and nephrology after that cause he stopped going to all his specialists or go discharged from the practice.  [facility] Dr. Holley Raring referral was done in March I believe.  This message stating the patient asked for a referral is false - there was nothing in the chart in May that came to [place].  Can you look at the prior Dr. Holley Raring referral?  It went through and there was an appt made.  Does an additional referral still need to be made?  And if I haven't seen him since Jan or maybe a virtaul once after that about 7 months ago- I do not know if the referral will go through without a OV, what do you think?  Delsa Grana, PA-C

## 2020-09-24 NOTE — Telephone Encounter (Signed)
Left detailed vm that referral was placed in 3/21 and that referral should cover may.  Told pt to call back with any questions or concerns

## 2020-10-10 ENCOUNTER — Other Ambulatory Visit: Payer: Self-pay

## 2020-10-10 ENCOUNTER — Other Ambulatory Visit: Payer: Self-pay | Admitting: Family Medicine

## 2020-10-10 ENCOUNTER — Ambulatory Visit (INDEPENDENT_AMBULATORY_CARE_PROVIDER_SITE_OTHER): Payer: Commercial Managed Care - HMO

## 2020-10-10 DIAGNOSIS — Z23 Encounter for immunization: Secondary | ICD-10-CM

## 2020-10-10 DIAGNOSIS — J329 Chronic sinusitis, unspecified: Secondary | ICD-10-CM

## 2020-10-10 DIAGNOSIS — J302 Other seasonal allergic rhinitis: Secondary | ICD-10-CM

## 2020-10-10 DIAGNOSIS — J310 Chronic rhinitis: Secondary | ICD-10-CM

## 2020-10-15 ENCOUNTER — Telehealth: Payer: Self-pay

## 2020-10-15 NOTE — Telephone Encounter (Signed)
Copied from [place] [id number]. Topic: Referral - Request for Referral >> Oct 12, 2020  3:22 PM Erick Blinks wrote: Has patient seen PCP for this complaint? Yes.   *If NO, is insurance requiring patient see PCP for this issue before PCP can refer them? Referral for which specialty: Hematology  Preferred provider/office: [place]  Reason for referral: diagnosis code. D69.9

## 2020-10-17 NOTE — Telephone Encounter (Signed)
Copied from [place] [phone]. Topic: General - Other >> Oct 16, 2020  2:40 PM Leward Quan A wrote: Reason for CRM: Legrand Como with [facility] called to request a new referral for appointment with Hematology 11/2/2. Fax# [phone]  Ph# [phone] Ext [phone]

## 2020-10-17 NOTE — Telephone Encounter (Signed)
Lvm that Pt needs an appt with Lucio Edward for Referral

## 2020-10-18 NOTE — Telephone Encounter (Signed)
Please schedule appointment.

## 2020-10-18 NOTE — Telephone Encounter (Signed)
lvm for pt to schedule appt °

## 2020-11-07 ENCOUNTER — Other Ambulatory Visit: Payer: Self-pay

## 2020-11-07 ENCOUNTER — Ambulatory Visit (INDEPENDENT_AMBULATORY_CARE_PROVIDER_SITE_OTHER): Payer: Commercial Managed Care - HMO | Admitting: Family Medicine

## 2020-11-07 ENCOUNTER — Encounter: Payer: Self-pay | Admitting: Family Medicine

## 2020-11-07 VITALS — BP 118/74 | HR 87 | Temp 98.0°F | Resp 18 | Ht 75.0 in | Wt 249.3 lb

## 2020-11-07 DIAGNOSIS — M3214 Glomerular disease in systemic lupus erythematosus: Secondary | ICD-10-CM

## 2020-11-07 DIAGNOSIS — Z79899 Other long term (current) drug therapy: Secondary | ICD-10-CM

## 2020-11-07 DIAGNOSIS — Z7901 Long term (current) use of anticoagulants: Secondary | ICD-10-CM

## 2020-11-07 DIAGNOSIS — D692 Other nonthrombocytopenic purpura: Secondary | ICD-10-CM | POA: Insufficient documentation

## 2020-11-07 DIAGNOSIS — I825Y2 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: Secondary | ICD-10-CM

## 2020-11-07 DIAGNOSIS — R809 Proteinuria, unspecified: Secondary | ICD-10-CM | POA: Diagnosis not present

## 2020-11-07 DIAGNOSIS — E782 Mixed hyperlipidemia: Secondary | ICD-10-CM

## 2020-11-07 DIAGNOSIS — J449 Chronic obstructive pulmonary disease, unspecified: Secondary | ICD-10-CM

## 2020-11-07 DIAGNOSIS — D84821 Immunodeficiency due to drugs: Secondary | ICD-10-CM

## 2020-11-07 DIAGNOSIS — J310 Chronic rhinitis: Secondary | ICD-10-CM

## 2020-11-07 DIAGNOSIS — I10 Essential (primary) hypertension: Secondary | ICD-10-CM | POA: Diagnosis not present

## 2020-11-07 DIAGNOSIS — R351 Nocturia: Secondary | ICD-10-CM | POA: Diagnosis not present

## 2020-11-07 DIAGNOSIS — D699 Hemorrhagic condition, unspecified: Secondary | ICD-10-CM | POA: Insufficient documentation

## 2020-11-07 DIAGNOSIS — R350 Frequency of micturition: Secondary | ICD-10-CM

## 2020-11-07 DIAGNOSIS — Z7952 Long term (current) use of systemic steroids: Secondary | ICD-10-CM

## 2020-11-07 DIAGNOSIS — J302 Other seasonal allergic rhinitis: Secondary | ICD-10-CM

## 2020-11-07 DIAGNOSIS — F324 Major depressive disorder, single episode, in partial remission: Secondary | ICD-10-CM

## 2020-11-07 DIAGNOSIS — N1831 Chronic kidney disease, stage 3a: Secondary | ICD-10-CM

## 2020-11-07 DIAGNOSIS — J329 Chronic sinusitis, unspecified: Secondary | ICD-10-CM

## 2020-11-07 LAB — POCT URINALYSIS DIPSTICK
Bilirubin, UA: NEGATIVE
Glucose, UA: NEGATIVE
Ketones, UA: NEGATIVE
Nitrite, UA: NEGATIVE
Protein, UA: POSITIVE — AB
Spec Grav, UA: 1.015 (ref 1.010–1.025)
Urobilinogen, UA: 0.2 E.U./dL
pH, UA: 5 (ref 5.0–8.0)

## 2020-11-07 MED ORDER — MONTELUKAST SODIUM 10 MG PO TABS: 10.0000 mg | ORAL_TABLET | Freq: Every day | ORAL | 3 refills | Status: DC

## 2020-11-07 MED ORDER — AMLODIPINE BESYLATE 5 MG PO TABS: 5.0000 mg | ORAL_TABLET | Freq: Every day | ORAL | 3 refills | Status: DC

## 2020-11-07 MED ORDER — LOSARTAN POTASSIUM 100 MG PO TABS: 100.0000 mg | ORAL_TABLET | Freq: Every day | ORAL | 3 refills | Status: DC

## 2020-11-07 MED ORDER — LORATADINE 10 MG PO TABS: 10.0000 mg | ORAL_TABLET | Freq: Every day | ORAL | 3 refills | Status: DC

## 2020-11-07 NOTE — Progress Notes (Signed)
Patient ID: Jonathon Snow, male    DOB: 05-20-1961, 59 y.o.   MRN: [id number]  PCP: Delsa Grana, PA-C  Chief Complaint  Patient presents with  . Referral    Dr. [facility]    Subjective:   Jonathon Snow is a 59 y.o. male, presents to clinic with CC of the following:  HPI   Pt presents for nephrology referral, however he has been seeing Dr. Holley Raring over the past several months, most recent OV with nephrology was11/02/2020, and March 2021 I did submit a referral to Dr. Holley Raring.  Multiple requests for this through phone calls and I have previously discussed them with the referral coordinator who reviewed the chart and also could see prior referrals and current office visits with the specialist.  Patient states that nephrology keep telling him he needs referral from his primary care.  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  He is back on his Eliquis, he has had IVC filter removed, he denies any recurrent chest pain, shortness of breath, leg pain or swelling.  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 Holley Raring he is prescribing his prednisone he is currently on 10 mg prednisone daily and cellcept, he does continue to take amlodipine and losartan Reviewed last OV labs and OV note 10/24/2020 Patient was previously established with [facility] rheumatology with Dr. Jefm Bryant and he was referred back to him earlier this year but he has not followed up Labs from 10/24/2020 show a estimated GFR of 62, serum creatinine 1.26 normal potassium H/H 15.5/47.1 platelets 183 Normal parathyroid hormone  Hypertension:  Currently managed on losartan 100 mg, amlodipine 10 mg Pt reports good med compliance and denies any SE.   Blood pressure today is well controlled. BP Readings from Last 3 Encounters:  11/07/20 118/74  05/28/20 (!) 162/96  01/17/20 124/84   Pt denies CP, SOB, exertional sx, LE edema, palpitation, Ha's, visual disturbances, lightheadedness, hypotension, syncope.  Hyperlipidemia: Patient is not currently taking Lipitor 40 mg, he has been prescribed Lipitor for several years also was prescribed Crestor 10 mg in the past, he believes he just ran out of medications but is willing to restart it.  No side effects, myalgias, jaundice or liver issues in the past with taking statins Last Lipids: Lab Results  Component Value Date   CHOL 227 (H) 01/17/2020   HDL 46 01/17/2020   LDLCALC 159 (H) 01/17/2020   TRIG 108 01/17/2020   CHOLHDL 4.9 01/17/2020  Last lipids poorly controlled the patient had been lost to follow-up for nearly the past 2 years - Denies: Chest  pain, shortness of breath, myalgias, claudication  History of seasonal allergies and recurrent chronic rhinosinusitis -on Singulair he has run out of Claritin and he does continue to take over-the-counter steroid nasal sprays  COPD-seeing pulmonology at [facility] clinic, Dr. Lanney Gins He has been managing with budesonide and rescue albuterol he is  currently pending approval for a injectable- Nucala/dupioxent? -In the last 2 days the pulmonary specialist have been working on the PA  Obesity, BMI 31.16 Wt Readings from Last 5 Encounters:  11/07/20 249 lb 4.8 oz (113.1 kg)  05/28/20 255 lb (115.7 kg)  03/01/20 250 lb (113.4 kg)  01/17/20 248 lb 12.8 oz (112.9 kg)  02/15/19 243 lb 3.2 oz (110.3 kg)   BMI Readings from Last 5 Encounters:  11/07/20 31.16 kg/m  05/28/20 31.87 kg/m  03/01/20 31.25 kg/m  01/17/20 31.10 kg/m  02/15/19 30.40 kg/m  He is concerned with daily prednisone use obesity and family history that he may develop diabetes He does endorse nocturia and urinary urgency and frequency, denies dysuria   Patient Active Problem List   Diagnosis Date Noted  . History of DVT (deep vein thrombosis) 03/22/2020  . Presence of IVC filter 03/22/2020  . Severe episode of recurrent major depressive disorder, without psychotic features ([place]) 09/12/2019  . Suspected COVID-19 virus infection 04/07/2019  . SLE glomerulonephritis syndrome ([place]) 02/16/2019  . Heel spur, left 02/16/2019  . Heel spur, right 02/16/2019  . 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 Osteoporosis 10/12/2018  . Chronic venous insufficiency 10/07/2018  . Lymphedema 10/07/2018  . Benign essential hypertension 07/28/2018  . Lupus nephritis ([place]) 03/03/2018  . Opiate abuse, episodic ([place]) 02/26/2018  . Syncope 12/29/2017  . Postphlebitic syndrome with ulcer, left ([place]) 11/18/2016  . 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 History of colonoscopy with polypectomy 05/13/2016  . Hyperglycemia 04/17/2016  . Hyperlipidemia 01/15/2016  . Hypertension 01/15/2016  . Obesity (BMI 30.0-34.9) 01/15/2016  . Hematuria 10/04/2015  . Long term current use of anticoagulant 10/04/2015  . 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[place]) 05/30/2015  . Proteinuria 05/30/2015  . Pulmonary embolism  ([place]) 05/30/2015  . COPD, moderate ([place]) 06/27/2014  . History of pulmonary embolism 12/11/2013  . Nodule of right lung 12/11/2013  . Right ankle fracture 12/10/2013  . 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 Calculus of kidney 08/21/2013      Current Outpatient Medications:  .  albuterol (VENTOLIN HFA) 108 (90 Base) MCG/ACT inhaler, Inhale 2 puffs into the lungs every 6 (six) hours as needed for wheezing or shortness of breath., Disp: 18 g, Rfl: 2 .  amLODipine (NORVASC) 5 MG tablet, Take 1 tablet (5 mg total) by mouth daily., Disp: 90 tablet, Rfl: 3 .  apixaban (ELIQUIS) 5 MG TABS tablet, Take 1 tablet (5 mg total) by mouth 2 (two) times daily., Disp: 60 tablet, Rfl: 2 .  budesonide (PULMICORT) 0.5 MG/2ML nebulizer solution, Inhale into the lungs., Disp: , Rfl:  .  ipratropium-albuterol (DUONEB) 0.5-2.5 (3) MG/3ML SOLN, Take 3 mLs by nebulization every 4 (four) hours as needed., Disp: 360 mL, Rfl: 3 .  losartan (COZAAR) 100 MG tablet, Take 1 tablet (100 mg total) by mouth daily., Disp: 90 tablet, Rfl: 3 .  Mepolizumab (NUCALA) 100 MG/ML SOAJ, Inject into the skin., Disp: , Rfl:  .  montelukast (SINGULAIR) 10 MG tablet, TAKE ONE TABLET BY MOUTH AT BEDTIME, Disp: 30 tablet, Rfl:  3 .  Multiple Vitamins-Minerals (MULTIVITAMIN WITH MINERALS) tablet, Take 1 tablet by mouth daily., Disp: , Rfl:  .  mycophenolate (CELLCEPT) 500 MG tablet, Take 1,000 mg by mouth 2 (two) times daily., Disp: , Rfl:  .  predniSONE (DELTASONE) 10 MG tablet, Take 10 mg by mouth daily with breakfast., Disp: , Rfl:  .  triamcinolone ointment (KENALOG) 0.1 %, Apply 1 application topically 2 (two) times daily as needed. To affected rash/patch areas to leg for up to 1 week at a time, Disp: 30 g, Rfl: 0 .  atorvastatin (LIPITOR) 40 MG tablet, Take 1 tablet (40 mg total) by mouth at bedtime. (Patient not taking: Reported on 11/07/2020), Disp: 30 tablet, Rfl: 1 .   loratadine (CLARITIN) 10 MG tablet, Take 1 tablet (10 mg total) by mouth daily. (Patient not taking: Reported on 11/07/2020), Disp: 30 tablet, Rfl: 11 .  mometasone (NASONEX) 50 MCG/ACT nasal spray, PLACE 2 SPRAYS INTO THE NOSE DAILY. (2 SPRAYS EACH NOSTRIL) (Patient not taking: Reported on 11/07/2020), Disp: 1 g, Rfl: 11   Allergies  Allergen Reactions  . Hydrocodone Rash  . Vicodin [Hydrocodone-Acetaminophen] Hives and Rash    Severe headaches (also)     Social History   Tobacco Use  . Smoking status: Never Smoker  . Smokeless tobacco: Never Used  Vaping Use  . Vaping Use: Never used  Substance Use Topics  . Alcohol use: No  . Drug use: No      Chart Review Today: I personally reviewed active problem list, medication list, allergies, family history, social history, health maintenance, notes from last encounter, lab results, imaging with the patient/caregiver today.   Review of Systems 10 Systems reviewed and are negative for acute change except as noted in the HPI.     Objective:   Vitals:   11/07/20 0924  BP: 118/74  Pulse: 87  Resp: 18  Temp: 98 F (36.7 C)  TempSrc: Oral  SpO2: 98%  Weight: 249 lb 4.8 oz (113.1 kg)  Height: 6\' 3"  (1.905 m)    Body mass index is 31.16 kg/m.  Physical Exam Vitals and nursing note reviewed.  Constitutional:      General: He is not in acute distress.    Appearance: Normal appearance. He is well-developed. He is obese. He is not ill-appearing, toxic-appearing or diaphoretic.     Interventions: Face mask in place.  HENT:     Head: Normocephalic and atraumatic.     Jaw: No trismus.     Right Ear: External ear normal.     Left Ear: External ear normal.  Eyes:     General: Lids are normal. No scleral icterus.       Right eye: No discharge.        Left eye: No discharge.     Conjunctiva/sclera: Conjunctivae normal.  Neck:     Trachea: Trachea and phonation normal. No tracheal deviation.  Cardiovascular:     Rate and  Rhythm: Normal rate and regular rhythm.     Pulses: Normal pulses.          Radial pulses are 2+ on the right side and 2+ on the left side.       Posterior tibial pulses are 2+ on the right side and 2+ on the left side.     Heart sounds: Normal heart sounds. No murmur heard.  No friction rub. No gallop.   Pulmonary:     Effort: Pulmonary effort is normal. No respiratory distress.  Breath sounds: Normal breath sounds. No stridor. No wheezing, rhonchi or rales.  Abdominal:     General: Bowel sounds are normal. There is no distension.     Palpations: Abdomen is soft.  Musculoskeletal:     Right lower leg: No edema.     Left lower leg: No edema.  Skin:    General: Skin is warm and dry.     Coloration: Skin is not jaundiced.     Findings: No rash.     Nails: There is no clubbing.  Neurological:     Mental Status: He is alert. Mental status is at baseline.     Cranial Nerves: No dysarthria or facial asymmetry.     Motor: No tremor or abnormal muscle tone.     Gait: Gait normal.  Psychiatric:        Mood and Affect: Mood normal.        Speech: Speech normal.        Behavior: Behavior normal. Behavior is cooperative.      Results for orders placed or performed during the hospital encounter of 05/28/20  Surgical pathology  Result Value Ref Range   SURGICAL PATHOLOGY      SURGICAL PATHOLOGY CASE: [id number] PATIENT: Jolayne Panther Surgical Pathology Report     Specimen Submitted: A. Colon polyp, cecum; cold snare B. Colon polyp, transverse; cold snare C. Colon polyp, sigmoid; cold snare  Clinical History: Screening colonoscopy.  Diverticulosis, colon polyps     DIAGNOSIS: A.  COLON POLYP, CECUM; COLD SNARE: - TUBULAR ADENOMA. - NEGATIVE FOR HIGH-GRADE DYSPLASIA AND MALIGNANCY.  B.  COLON POLYP, TRANSVERSE; COLD SNARE: - COLONIC MUCOSA WITH LYMPHOID AGGREGATE. - NEGATIVE FOR DYSPLASIA AND MALIGNANCY.  C.  COLON POLYP, SIGMOID; COLD SNARE: - TUBULAR ADENOMA. -  NEGATIVE FOR HIGH-GRADE DYSPLASIA AND MALIGNANCY.  GROSS DESCRIPTION: A. Labeled: Cecal polyp, cold snare x1 Received: In formalin Tissue fragment(s): 2 Size: 0.8 x 0.4 x 0.1 cm Description: Aggregate of tan polypoid fragments Entirely submitted in 1 cassette.  B. Labeled: Transverse colon polyp, cold snare x1 Received: In formalin Tissue fragment(s): 1 Siz e: 0.8 x 0.4 x 0.1 cm Description: Pink-red polypoid fragment, trisected Entirely submitted in 1 cassette.  C. Labeled: Sigmoid colon polyp, cold snare x1 Received: In formalin Tissue fragment(s): 1 Size: 0.7 x 0.6 x 0.3 cm Description: Pale-tan polypoid fragment, bisected Entirely submitted in 1 cassette.   Final Diagnosis performed by Bryan Lemma, MD.   Electronically signed 05/29/2020 6:33:49PM The electronic signature indicates that the named Attending Pathologist has evaluated the specimen Technical component performed at [facility], [address], [place], [place] [address] Lab: [phone] Dir: Rush Farmer, MD, MMM  Professional component performed at [facility], [facility], [place], [place], [place] [address] Lab: [phone] Dir: Dellia Nims. Rubinas, MD    Depression screen [facility] 2/9 11/07/2020 03/01/2020 01/17/2020  Decreased Interest 0 0 0  Down, Depressed, Hopeless 0 0 0  PHQ - 2 Score 0 0 0  Altered sleeping - 0 1  Tired, decreased energy - 0 3  Change in appetite - 0 1  Feeling bad or failure about yourself  - 0 0  Trouble concentrating - 0 0  Moving slowly or fidgety/restless - 0 0  Suicidal thoughts - 0 0  PHQ-9 Score - 0 5  Difficult doing work/chores - Not difficult at all Very difficult  Some recent data might be hidden      Assessment & Plan:     ICD-10-CM  1. Mixed hyperlipidemia  E78.2 Lipid panel    COMPLETE METABOLIC PANEL WITH GFR   Patient is either not taking or has run out of statin medication will check labs, last lipids were poorly controlled may need to change  meds to manage  LDL for 5-6 years has been 120-150+ without much noticeable improvement related to med changes, patient does not recall when or why medications were changed in the past they have changed over the years with different PCPs at this clinic as far as he remembers he has not had any trouble with Crestor or Lipitor  Will wait for lab results before sending in statin if feel inclined to send in a higher dose of Crestor than what has previously been tried by the patient    2. SLE glomerulonephritis syndrome ([place])  M32.14 Ambulatory referral to Nephrology   Managed by Dr. Holley Raring- nephrology   3. Proteinuria, unspecified type  R80.9 Ambulatory referral to Nephrology    Urine Culture   Request for referral to nephrology   4. Hypertension, unspecified type  Y69 COMPLETE METABOLIC PANEL WITH GFR    losartan (COZAAR) 100 MG tablet    amLODipine (NORVASC) 5 MG tablet    Ambulatory referral to Nephrology   Blood pressure stable and well-controlled today on losartan 100 mg and amlodipine   5. Stage 3a chronic kidney disease (HCC)  S85.46 COMPLETE METABOLIC PANEL WITH GFR    Ambulatory referral to Nephrology   He is compliant with his losartan and amlodipine and has been compliant with seeing nephrology, recent visits and labs reviewed  6. 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 COPD, moderate ([place])  J44.9    Managed by [facility] pulmonology currently patient reports his symptoms is well controlled he is getting new injectable medications approved  8. Long term current use of anticoagulant  Z79.01    per hematology  9. Immunocompromised state due to drug therapy [facility])  E70.350    Z79.899    Recurrent sinus issues lower threshold to start antibiotics, patient has been doing better over the last 8 months since reestablishing with specialist  10.  Long term current use of systemic steroids  Z79.52 Hemoglobin A1c    COMPLETE METABOLIC PANEL WITH GFR   Will screen for diabetes, monitoring immunocompromise state and his bone health  11. Rhinosinusitis  J31.0    J32.9    chronic, recurrent, allergic and also suspectable to infection - currently well controlled  12. Seasonal allergies  J30.2 loratadine (CLARITIN) 10 MG tablet    montelukast (SINGULAIR) 10 MG tablet   encouraged compliance to allergy control meds - get back on antihistamine, steroid nasal spray, use saline spray, and get and take singulair  13. Essential hypertension  I10    well controlled, but with lupus nephritis encouraged pt to restart norvasc as previously prescribed  14. Nocturia  R35.1 POCT urinalysis dipstick    Urine Culture   New but gradual onset of nocturia, screen UA and A1C, encouraged pt to get f/up appt to further eval   15. Urinary frequency  R35.0 POCT urinalysis dipstick    Urine Culture   Screen urine sent for culture if positive  16. Anticoagulant disorder (HCC)  D69.9    On daily anticoagulant, history of hematuria and newer senile purpura but no other bleeding concerns, per [facility] hematology  17. Senile  purpura ([place])  D69.2    multiple bruises to b/l UE, reassured common, lower threshold to bruise with meds   18. Major depressive disorder in partial remission, unspecified whether recurrent ([place]) Chronic F32.4    phq neg, pt appears healthier today than prior visits, mood is good     4 month f/up to recheck lipids/labs routine conditions - sooner if needed for acute compliants  Entered nephrology referral again today - though unclear what happened to need this again  Pt has not gone back to [facility] clinic for rheumatology but he states that there is nothing additional that he is missing in his management with medications or with his symptoms for his lupus/SLE   Delsa Grana, PA-C 11/07/20 9:33 AM

## 2020-11-08 ENCOUNTER — Encounter: Payer: Self-pay | Admitting: Family Medicine

## 2020-11-08 ENCOUNTER — Other Ambulatory Visit: Payer: Self-pay | Admitting: Family Medicine

## 2020-11-08 DIAGNOSIS — J310 Chronic rhinitis: Secondary | ICD-10-CM

## 2020-11-08 DIAGNOSIS — R7303 Prediabetes: Secondary | ICD-10-CM

## 2020-11-08 DIAGNOSIS — J329 Chronic sinusitis, unspecified: Secondary | ICD-10-CM

## 2020-11-08 DIAGNOSIS — E782 Mixed hyperlipidemia: Secondary | ICD-10-CM

## 2020-11-08 DIAGNOSIS — Z7901 Long term (current) use of anticoagulants: Secondary | ICD-10-CM

## 2020-11-08 DIAGNOSIS — N1831 Chronic kidney disease, stage 3a: Secondary | ICD-10-CM

## 2020-11-08 DIAGNOSIS — I10 Essential (primary) hypertension: Secondary | ICD-10-CM

## 2020-11-08 DIAGNOSIS — Z7952 Long term (current) use of systemic steroids: Secondary | ICD-10-CM

## 2020-11-08 DIAGNOSIS — D692 Other nonthrombocytopenic purpura: Secondary | ICD-10-CM

## 2020-11-08 DIAGNOSIS — J302 Other seasonal allergic rhinitis: Secondary | ICD-10-CM

## 2020-11-08 HISTORY — DX: Long term (current) use of systemic steroids: Z79.52

## 2020-11-08 LAB — LIPID PANEL
Cholesterol: 275 mg/dL — ABNORMAL HIGH (ref <200)
HDL: 55 mg/dL (ref >=40)
LDL Cholesterol (Calc): 196 mg/dL (calc) — ABNORMAL HIGH
Non-HDL Cholesterol (Calc): 220 mg/dL (calc) — ABNORMAL HIGH (ref <130)
Total CHOL/HDL Ratio: 5 (calc) — ABNORMAL HIGH (ref <5.0)
Triglycerides: 107 mg/dL (ref <150)

## 2020-11-08 LAB — COMPLETE METABOLIC PANEL WITH GFR
AG Ratio: 1.5 (calc) (ref 1.0–2.5)
ALT: 17 U/L (ref 9–46)
AST: 17 U/L (ref 10–35)
Albumin: 4.1 g/dL (ref 3.6–5.1)
Alkaline phosphatase (APISO): 78 U/L (ref 35–144)
BUN/Creatinine Ratio: 23 (calc) — ABNORMAL HIGH (ref 6–22)
BUN: 37 mg/dL — ABNORMAL HIGH (ref 7–25)
CO2: 27 mmol/L (ref 20–32)
Calcium: 9.4 mg/dL (ref 8.6–10.3)
Chloride: 103 mmol/L (ref 98–110)
Creat: 1.62 mg/dL — ABNORMAL HIGH (ref 0.70–1.33)
GFR, Est African American: 53 mL/min/{1.73_m2} — ABNORMAL LOW (ref >=60)
GFR, Est Non African American: 46 mL/min/{1.73_m2} — ABNORMAL LOW (ref >=60)
Globulin: 2.8 g/dL (calc) (ref 1.9–3.7)
Glucose, Bld: 96 mg/dL (ref 65–99)
Potassium: 5.2 mmol/L (ref 3.5–5.3)
Sodium: 139 mmol/L (ref 135–146)
Total Bilirubin: 0.7 mg/dL (ref 0.2–1.2)
Total Protein: 6.9 g/dL (ref 6.1–8.1)

## 2020-11-08 LAB — URINE CULTURE
MICRO NUMBER:: 11216697
SPECIMEN QUALITY:: ADEQUATE

## 2020-11-08 LAB — HEMOGLOBIN A1C
Hgb A1c MFr Bld: 5.8 % of total Hgb — ABNORMAL HIGH (ref <5.7)
Mean Plasma Glucose: 120 (calc)
eAG (mmol/L): 6.6 (calc)

## 2020-11-08 MED ORDER — ROSUVASTATIN CALCIUM 20 MG PO TABS
20.0000 mg | ORAL_TABLET | Freq: Every day | ORAL | 3 refills | Status: DC
Start: 2020-11-08 — End: 2021-03-19

## 2020-11-08 MED ORDER — ROSUVASTATIN CALCIUM 20 MG PO TABS
20.0000 mg | ORAL_TABLET | Freq: Every day | ORAL | 3 refills | Status: DC
Start: 2020-11-08 — End: 2020-11-08

## 2020-11-13 ENCOUNTER — Ambulatory Visit: Payer: Self-pay | Attending: Internal Medicine

## 2020-11-13 DIAGNOSIS — Z23 Encounter for immunization: Secondary | ICD-10-CM

## 2020-11-13 NOTE — Progress Notes (Signed)
   Covid-19 Vaccination Clinic  Name:  PIPER ALBRO    MRN: [id number] DOB: 04-24-1961  11/13/2020  Mr. Tinnel was observed post Covid-19 immunization for 15 minutes without incident. He was provided with Vaccine Information Sheet and instruction to access the V-Safe system.   Mr. Hofman was instructed to call 911 with any severe reactions post vaccine: [name] Difficulty breathing  . Swelling of face and throat  . A fast heartbeat  . A bad rash all over body  . Dizziness and weakness   Immunizations Administered    Name Date Dose VIS Date Route   Pfizer COVID-19 Vaccine 11/13/2020 10:52 AM 0.3 mL 10/10/2020 Intramuscular   Manufacturer: [place]   Lot: [id number]   [place]: [id number]

## 2020-11-16 ENCOUNTER — Other Ambulatory Visit: Payer: Self-pay | Admitting: Family Medicine

## 2020-11-16 ENCOUNTER — Other Ambulatory Visit: Payer: Self-pay | Admitting: Internal Medicine

## 2020-11-16 DIAGNOSIS — Z7901 Long term (current) use of anticoagulants: Secondary | ICD-10-CM

## 2020-11-19 NOTE — Telephone Encounter (Signed)
This pt went to hematology specialists and they should have taken over this medication- they are doing labs for this and have f/up for this.  Please have him call and see if they will prescribe his med that they are managing  He would have been out early this year if I was the only one prescribing it -   Please call pt and ask him to contact hematology for refills  And please let me know if he had been out of this medication for months??  Thanks [employer]

## 2020-11-28 ENCOUNTER — Telehealth: Payer: Self-pay | Admitting: Family Medicine

## 2020-11-28 NOTE — Telephone Encounter (Signed)
I spoke with Sharyn Lull [facility]) and she stated we cannot do a retro referral via the portal if it is been past the 5th day of service. She gave me the number to provider services [phone]) to see if they can help.   Spoke with [place] provider services and was told that they can cover the visits on 7/9 & 11/3 but not 3/1. They can only go back 180 days.  She said it will take about 4 business days and they will send a fax to the office.  Ref # [id number]

## 2020-11-28 NOTE — Telephone Encounter (Signed)
No referral was sent due to it was not approved by [facility]. Please review telephone note on 09/19/2020.

## 2020-11-28 NOTE — Telephone Encounter (Signed)
Angel from [facility] called stating they never received insurance authorization for patient being referred to their office.  They have received the referrals and the patient has been by their office on 02/20/2020, 06/29/2020, and 10/24/2020.  Please contact Angel at [phone] with the insurance authorization for the above dates of service.

## 2020-12-14 ENCOUNTER — Telehealth: Payer: Self-pay | Admitting: Family Medicine

## 2020-12-14 DIAGNOSIS — Z7901 Long term (current) use of anticoagulants: Secondary | ICD-10-CM

## 2020-12-17 NOTE — Telephone Encounter (Signed)
Pt called asking why his Eliquis was denied. He has been out 4 days.  He uses [facility].  CB#  [phone]

## 2020-12-18 NOTE — Telephone Encounter (Signed)
Patient stated he got refills from Hematologist

## 2020-12-22 DIAGNOSIS — U071 COVID-19: Secondary | ICD-10-CM

## 2020-12-22 HISTORY — DX: COVID-19: U07.1

## 2020-12-30 ENCOUNTER — Other Ambulatory Visit: Payer: Commercial Managed Care - HMO

## 2020-12-30 ENCOUNTER — Other Ambulatory Visit: Payer: Self-pay

## 2020-12-30 DIAGNOSIS — Z20822 Contact with and (suspected) exposure to covid-19: Secondary | ICD-10-CM

## 2021-01-01 LAB — NOVEL CORONAVIRUS, NAA: SARS-CoV-2, NAA: DETECTED — AB

## 2021-01-01 LAB — SARS-COV-2, NAA 2 DAY TAT

## 2021-01-02 ENCOUNTER — Encounter: Payer: Self-pay | Admitting: Nurse Practitioner

## 2021-01-02 ENCOUNTER — Telehealth: Payer: Self-pay | Admitting: Nurse Practitioner

## 2021-01-02 ENCOUNTER — Telehealth: Payer: Self-pay

## 2021-01-02 DIAGNOSIS — I10 Essential (primary) hypertension: Secondary | ICD-10-CM | POA: Insufficient documentation

## 2021-01-02 NOTE — Telephone Encounter (Signed)
I connected by phone with Jonathon Snow on 01/02/2021 at 9:06 AM to discuss the potential use of a new treatment for mild to moderate COVID-19 viral infection in non-hospitalized patients.  This patient is a 61 y.o. male that meets the FDA criteria for Emergency Use Authorization of COVID monoclonal antibody sotrovimab.  Has a (+) direct SARS-CoV-2 viral test result  Has mild or moderate COVID-19   Is NOT hospitalized due to COVID-19  Is within 10 days of symptom onset  Has at least one of the high risk factor(s) for progression to severe COVID-19 and/or hospitalization as defined in EUA.  Specific high risk criteria : BMI > 25, Chronic Kidney Disease (CKD), Immunosuppressive Disease or Treatment and Other high risk medical condition per CDC:  COPD/Asthma,   I have spoken and communicated the following to the patient or parent/caregiver regarding COVID monoclonal antibody treatment:  1. FDA has authorized the emergency use for the treatment of mild to moderate COVID-19 in adults and pediatric patients with positive results of direct SARS-CoV-2 viral testing who are 20 years of age and older weighing at least 40 kg, and who are at high risk for progressing to severe COVID-19 and/or hospitalization.  2. The significant known and potential risks and benefits of COVID monoclonal antibody, and the extent to which such potential risks and benefits are unknown.  3. Information on available alternative treatments and the risks and benefits of those alternatives, including clinical trials.  4. Patients treated with COVID monoclonal antibody should continue to self-isolate and use infection control measures (e.g., wear mask, isolate, social distance, avoid sharing personal items, clean and disinfect "high touch" surfaces, and frequent handwashing) according to CDC guidelines.   5. The patient or parent/caregiver has the option to accept or refuse COVID monoclonal antibody treatment.  After  reviewing this information with the patient, the patient has agreed to receive one of the available covid 19 monoclonal antibodies and will be provided an appropriate fact sheet prior to infusion. [name], RN 01/02/2021 9:06 AM  Pt. Reports he started having symptoms 12/29/20 - cough,sore throat,shortness of breath. Takes Cellcept.

## 2021-01-02 NOTE — Telephone Encounter (Signed)
I called Jonathon Snow to discuss Covid symptoms and the use of Sotrovimab, a monoclonal antibody infusion for those with mild to moderate Covid symptoms and at a high risk of hospitalization.     Pt is qualified for this infusion at the monoclonal antibody infusion center due to co-morbid conditions and/or a member of an at-risk group, however would like to think more about the infusion at this time. Symptoms tier reviewed as well as criteria for ending isolation.  Symptoms reviewed that would warrant ED/Hospital evaluation. Preventative practices reviewed. Patient verbalized understanding. Patient advised to call back if he decides that he does want to get infusion. Callback number to the infusion center given. Patient advised to go to Urgent care or ED with severe symptoms. Last date pt would be eligible for infusion is 1/14.    Patient Active Problem List   Diagnosis Date Noted  . Hypertension   . Morbid obesity ([place])   . COVID-19 virus infection 12/2020  . Prediabetes 11/08/2020  . Stage 3a chronic kidney disease ([place]) 11/08/2020  . Seasonal allergies 11/08/2020  . Rhinosinusitis 11/08/2020  . Long term current use of systemic steroids 11/08/2020  . Anticoagulant disorder ([place]) 11/07/2020  . Senile purpura ([place]) 11/07/2020  . History of DVT (deep vein thrombosis) 03/22/2020  . Severe episode of recurrent major depressive disorder, without psychotic features ([place]) 09/12/2019  . 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 Osteoporosis 10/12/2018  . Chronic venous insufficiency 10/07/2018  . Syncope 12/29/2017  . 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 Mixed hyperlipidemia 01/15/2016  . Essential hypertension 01/15/2016  . Obesity (BMI 30.0-34.9) 01/15/2016  . Long term current use of anticoagulant 10/04/2015  . 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[place])  05/30/2015  . Pulmonary embolism ([place]) 05/30/2015  . COPD, moderate ([place]) 06/27/2014  . History of pulmonary embolism 12/11/2013  . Nodule of right lung 12/11/2013  . 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

## 2021-01-03 ENCOUNTER — Telehealth: Payer: Commercial Managed Care - HMO | Admitting: Family Medicine

## 2021-01-17 ENCOUNTER — Telehealth: Payer: Commercial Managed Care - HMO | Admitting: Family Medicine

## 2021-02-05 ENCOUNTER — Telehealth: Payer: Commercial Managed Care - HMO | Admitting: Family Medicine

## 2021-02-19 ENCOUNTER — Ambulatory Visit: Payer: Self-pay | Admitting: Family Medicine

## 2021-03-07 ENCOUNTER — Ambulatory Visit: Payer: Commercial Managed Care - HMO | Admitting: Family Medicine

## 2021-03-19 ENCOUNTER — Encounter: Payer: Self-pay | Admitting: Physician Assistant

## 2021-03-19 ENCOUNTER — Ambulatory Visit: Payer: 59 | Admitting: Physician Assistant

## 2021-03-19 ENCOUNTER — Other Ambulatory Visit: Payer: Self-pay

## 2021-03-19 VITALS — BP 120/80 | HR 98 | Temp 98.5°F | Resp 16 | Ht 75.0 in | Wt 243.4 lb

## 2021-03-19 DIAGNOSIS — Z86718 Personal history of other venous thrombosis and embolism: Secondary | ICD-10-CM

## 2021-03-19 DIAGNOSIS — M3214 Glomerular disease in systemic lupus erythematosus: Secondary | ICD-10-CM

## 2021-03-19 DIAGNOSIS — I10 Essential (primary) hypertension: Secondary | ICD-10-CM | POA: Diagnosis not present

## 2021-03-19 DIAGNOSIS — R7303 Prediabetes: Secondary | ICD-10-CM

## 2021-03-19 DIAGNOSIS — E785 Hyperlipidemia, unspecified: Secondary | ICD-10-CM

## 2021-03-19 MED ORDER — ROSUVASTATIN CALCIUM 20 MG PO TABS: ORAL_TABLET | ORAL | 0 refills | Status: DC

## 2021-03-19 NOTE — Patient Instructions (Signed)
High Cholesterol  High cholesterol is a condition in which the blood has high levels of a white, waxy substance similar to fat (cholesterol). The liver makes all the cholesterol that the body needs. The human body needs small amounts of cholesterol to help build cells. A person gets extra or excess cholesterol from the food that he or she eats. The blood carries cholesterol from the liver to the rest of the body. If you have high cholesterol, deposits (plaques) may build up on the walls of your arteries. Arteries are the blood vessels that carry blood away from your heart. These plaques make the arteries narrow and stiff. Cholesterol plaques increase your risk for heart attack and stroke. Work with your health care provider to keep your cholesterol levels in a healthy range. What increases the risk? The following factors may make you more likely to develop this condition:  Eating foods that are high in animal fat (saturated fat) or cholesterol.  Being overweight.  Not getting enough exercise.  A family history of high cholesterol (familial hypercholesterolemia).  Use of tobacco products.  Having diabetes. What are the signs or symptoms? There are no symptoms of this condition. How is this diagnosed? This condition may be diagnosed based on the results of a blood test.  If you are older than 60 years of age, your health care provider may check your cholesterol levels every 4-6 years.  You may be checked more often if you have high cholesterol or other risk factors for heart disease. The blood test for cholesterol measures:  "Bad" cholesterol, or LDL cholesterol. This is the main type of cholesterol that causes heart disease. The desired level is less than 100 mg/dL.  "Good" cholesterol, or HDL cholesterol. HDL helps protect against heart disease by cleaning the arteries and carrying the LDL to the liver for processing. The desired level for HDL is 60 mg/dL or higher.  Triglycerides.  These are fats that your body can store or burn for energy. The desired level is less than 150 mg/dL.  Total cholesterol. This measures the total amount of cholesterol in your blood and includes LDL, HDL, and triglycerides. The desired level is less than 200 mg/dL. How is this treated? This condition may be treated with:  Diet changes. You may be asked to eat foods that have more fiber and less saturated fats or added sugar.  Lifestyle changes. These may include regular exercise, maintaining a healthy weight, and quitting use of tobacco products.  Medicines. These are given when diet and lifestyle changes have not worked. You may be prescribed a statin medicine to help lower your cholesterol levels. Follow these instructions at home: Eating and drinking  Eat a healthy, balanced diet. This diet includes: ? Daily servings of a variety of fresh, frozen, or canned fruits and vegetables. ? Daily servings of whole grain foods that are rich in fiber. ? Foods that are low in saturated fats and trans fats. These include poultry and fish without skin, lean cuts of meat, and low-fat dairy products. ? A variety of fish, especially oily fish that contain omega-3 fatty acids. Aim to eat fish at least 2 times a week.  Avoid foods and drinks that have added sugar.  Use healthy cooking methods, such as roasting, grilling, broiling, baking, poaching, steaming, and stir-frying. Do not fry your food except for stir-frying.   Lifestyle  Get regular exercise. Aim to exercise for a total of 150 minutes a week. Increase your activity level by doing activities   such as gardening, walking, and taking the stairs.  Do not use any products that contain nicotine or tobacco, such as cigarettes, e-cigarettes, and chewing tobacco. If you need help quitting, ask your health care provider.   General instructions  Take over-the-counter and prescription medicines only as told by your health care provider.  Keep all  follow-up visits as told by your health care provider. This is important. Where to find more information  American Heart Association: www.heart.org  National Heart, Lung, and Blood Institute: www.nhlbi.nih.gov Contact a health care provider if:  You have trouble achieving or maintaining a healthy diet or weight.  You are starting an exercise program.  You are unable to stop smoking. Get help right away if:  You have chest pain.  You have trouble breathing.  You have any symptoms of a stroke. "BE FAST" is an easy way to remember the main warning signs of a stroke: ? B - Balance. Signs are dizziness, sudden trouble walking, or loss of balance. ? E - Eyes. Signs are trouble seeing or a sudden change in vision. ? F - Face. Signs are sudden weakness or numbness of the face, or the face or eyelid drooping on one side. ? A - Arms. Signs are weakness or numbness in an arm. This happens suddenly and usually on one side of the body. ? S - Speech. Signs are sudden trouble speaking, slurred speech, or trouble understanding what people say. ? T - Time. Time to call emergency services. Write down what time symptoms started.  You have other signs of a stroke, such as: ? A sudden, severe headache with no known cause. ? Nausea or vomiting. ? Seizure. These symptoms may represent a serious problem that is an emergency. Do not wait to see if the symptoms will go away. Get medical help right away. Call your local emergency services (911 in the [place] Do not drive yourself to the hospital. Summary  Cholesterol plaques increase your risk for heart attack and stroke. Work with your health care provider to keep your cholesterol levels in a healthy range.  Eat a healthy, balanced diet, get regular exercise, and maintain a healthy weight.  Do not use any products that contain nicotine or tobacco, such as cigarettes, e-cigarettes, and chewing tobacco.  Get help right away if you have any symptoms of a  stroke. This information is not intended to replace advice given to you by your health care provider. Make sure you discuss any questions you have with your health care provider. Document Revised: 11/07/2019 Document Reviewed: 11/07/2019 Elsevier Patient Education  2021 Elsevier Inc.  

## 2021-03-19 NOTE — Progress Notes (Signed)
Established patient visit   Patient: Jonathon Snow   DOB: 01/09/1961   60 y.o. Male  MRN: [id number] Visit Date: 03/19/2021  Today's healthcare provider: Trinna Post, PA-C   Chief Complaint  Patient presents with  . Follow-up  . Hypertension  . Hyperlipidemia   Subjective    HPI   Lipid/Cholesterol, Follow-up  Last lipid panel Other pertinent labs  Lab Results  Component Value Date   CHOL 275 (H) 11/07/2020   HDL 55 11/07/2020   LDLCALC 196 (H) 11/07/2020   TRIG 107 11/07/2020   CHOLHDL 5.0 (H) 11/07/2020   Lab Results  Component Value Date   ALT 17 11/07/2020   AST 17 11/07/2020   PLT 182 01/17/2020   TSH 3.06 07/28/2018     He was last seen for this 4 months ago.  Management since that visit includes change lipitor 20 mg QHS to crestor 20 mg QHS.  He reports poor compliance with treatment. He is having muscle cramps and stopped taking it after 3 weeks.  Symptoms: No chest pain No chest pressure/discomfort  No dyspnea No lower extremity edema  No numbness or tingling of extremity No orthopnea  No palpitations No paroxysmal nocturnal dyspnea  No speech difficulty No syncope   Current diet: well balanced Current exercise: aerobics  The 10-year ASCVD risk score Mikey Bussing DC Jr., et al., 2013) is: 10.4%  --------------------------------------------------------------------------------------------------- Hypertension, follow-up  BP Readings from Last 3 Encounters:  03/19/21 120/80  11/07/20 118/74  05/28/20 (!) 162/96   Wt Readings from Last 3 Encounters:  03/19/21 243 lb 6.4 oz (110.4 kg)  11/07/20 249 lb 4.8 oz (113.1 kg)  05/28/20 255 lb (115.7 kg)     He was last seen for hypertension 4 months ago.  BP at that visit was normal. Management since that visit includes continue amlodipine 5 mg daily and losartan 100 mg daily.  He reports excellent compliance with treatment. He is not having side effects.  He is following a Regular diet. He  is exercising. He does not smoke.  Use of agents associated with hypertension: none.   Outside blood pressures are normal. Symptoms: No chest pain No chest pressure  No palpitations No syncope  No dyspnea No orthopnea  No paroxysmal nocturnal dyspnea No lower extremity edema   Pertinent labs: Lab Results  Component Value Date   CHOL 275 (H) 11/07/2020   HDL 55 11/07/2020   LDLCALC 196 (H) 11/07/2020   TRIG 107 11/07/2020   CHOLHDL 5.0 (H) 11/07/2020   Lab Results  Component Value Date   NA 139 11/07/2020   K 5.2 11/07/2020   CREATININE 1.62 (H) 11/07/2020   GFRNONAA 46 (L) 11/07/2020   GFRAA 53 (L) 11/07/2020   GLUCOSE 96 11/07/2020     The 10-year ASCVD risk score Mikey Bussing DC Jr., et al., 2013) is: 10.4%   ---------------------------------------------------------------------------------------------------        Medications: Outpatient Medications Prior to Visit  Medication Sig  . albuterol (VENTOLIN HFA) 108 (90 Base) MCG/ACT inhaler Inhale 2 puffs into the lungs every 6 (six) hours as needed for wheezing or shortness of breath.  [name] amLODipine (NORVASC) 5 MG tablet Take 1 tablet (5 mg total) by mouth daily.  [name] apixaban (ELIQUIS) 5 MG TABS tablet Take 1 tablet (5 mg total) by mouth 2 (two) times daily.  . budesonide (PULMICORT) 0.5 MG/2ML nebulizer solution Inhale into the lungs.  [name] ipratropium-albuterol (DUONEB) 0.5-2.5 (3) MG/3ML SOLN Take 3 mLs by  nebulization every 4 (four) hours as needed.  . loratadine (CLARITIN) 10 MG tablet Take 1 tablet (10 mg total) by mouth daily.  [name] losartan (COZAAR) 100 MG tablet Take 1 tablet (100 mg total) by mouth daily.  . Mepolizumab (NUCALA) 100 MG/ML SOAJ Inject into the skin.  . montelukast (SINGULAIR) 10 MG tablet Take 1 tablet (10 mg total) by mouth at bedtime.  . Multiple Vitamins-Minerals (MULTIVITAMIN WITH MINERALS) tablet Take 1 tablet by mouth daily.  . mycophenolate (CELLCEPT) 500 MG tablet Take 1,000 mg by mouth 2 (two)  times daily.  . predniSONE (DELTASONE) 10 MG tablet Take 10 mg by mouth daily with breakfast.  . triamcinolone ointment (KENALOG) 0.1 % Apply 1 application topically 2 (two) times daily as needed. To affected rash/patch areas to leg for up to 1 week at a time  . [DISCONTINUED] rosuvastatin (CRESTOR) 20 MG tablet Take 1 tablet (20 mg total) by mouth at bedtime.   No facility-administered medications prior to visit.    Review of Systems  All other systems reviewed and are negative.      Objective    BP 120/80   Pulse 98   Temp 98.5 F (36.9 C)   Resp 16   Ht 6\' 3"  (1.905 m)   Wt 243 lb 6.4 oz (110.4 kg)   SpO2 99%   BMI 30.42 kg/m     Physical Exam Constitutional:      Appearance: Normal appearance.  Cardiovascular:     Rate and Rhythm: Normal rate and regular rhythm.     Heart sounds: Normal heart sounds.  Pulmonary:     Effort: Pulmonary effort is normal.     Breath sounds: Normal breath sounds.  Skin:    General: Skin is warm and dry.  Neurological:     Mental Status: He is alert and oriented to person, place, and time. Mental status is at baseline.  Psychiatric:        Mood and Affect: Mood normal.        Behavior: Behavior normal.       No results found for any visits on 03/19/21.  Assessment & Plan    1. Essential hypertension  Continue current medications.  2. Hyperlipidemia, unspecified hyperlipidemia type  Will try every other day dosing, if still having muscle cramps, will refer to cardiology for repatha.   - rosuvastatin (CRESTOR) 20 MG tablet; Take one pill every other night before bed.  Dispense: 90 tablet; Refill: 0  3. Prediabetes  Discussed importance of healthy weight management Discussed diet and exercise    Return in about 3 months (around 06/19/2021) for HTN, HLD.        Trinna Post, PA-C  [facility] [phone] (phone) [phone] (fax)  [place]

## 2021-03-20 LAB — URINALYSIS, ROUTINE W REFLEX MICROSCOPIC
Bacteria, UA: NONE SEEN /HPF
Bilirubin Urine: NEGATIVE
Glucose, UA: NEGATIVE
Hgb urine dipstick: NEGATIVE
Hyaline Cast: NONE SEEN /LPF
Ketones, ur: NEGATIVE
Leukocytes,Ua: NEGATIVE
Nitrite: NEGATIVE
RBC / HPF: NONE SEEN /HPF (ref 0–2)
Specific Gravity, Urine: 1.014 (ref 1.001–1.03)
Squamous Epithelial / HPF: NONE SEEN /HPF (ref <=5)
pH: <=5 (ref 5.0–8.0)

## 2021-03-20 LAB — MICROSCOPIC MESSAGE

## 2021-03-20 LAB — CBC WITH DIFFERENTIAL/PLATELET
Absolute Monocytes: 745 cells/uL (ref 200–950)
Basophils Absolute: 20 cells/uL (ref 0–200)
Basophils Relative: 0.4 %
Eosinophils Absolute: 92 cells/uL (ref 15–500)
Eosinophils Relative: 1.8 %
HCT: 46.7 % (ref 38.5–50.0)
Hemoglobin: 15.4 g/dL (ref 13.2–17.1)
Lymphs Abs: 882 cells/uL (ref 850–3900)
MCH: 29.2 pg (ref 27.0–33.0)
MCHC: 33 g/dL (ref 32.0–36.0)
MCV: 88.6 fL (ref 80.0–100.0)
MPV: 11.1 fL (ref 7.5–12.5)
Monocytes Relative: 14.6 %
Neutro Abs: 3361 cells/uL (ref 1500–7800)
Neutrophils Relative %: 65.9 %
Platelets: 176 10*3/uL (ref 140–400)
RBC: 5.27 10*6/uL (ref 4.20–5.80)
RDW: 14 % (ref 11.0–15.0)
Total Lymphocyte: 17.3 %
WBC: 5.1 10*3/uL (ref 3.8–10.8)

## 2021-03-20 LAB — HEMOGLOBIN A1C
Hgb A1c MFr Bld: 5.8 % of total Hgb — ABNORMAL HIGH (ref <5.7)
Mean Plasma Glucose: 120 mg/dL
eAG (mmol/L): 6.6 mmol/L

## 2021-03-20 LAB — COMPREHENSIVE METABOLIC PANEL
AG Ratio: 1.2 (calc) (ref 1.0–2.5)
ALT: 16 U/L (ref 9–46)
AST: 15 U/L (ref 10–35)
Albumin: 3.7 g/dL (ref 3.6–5.1)
Alkaline phosphatase (APISO): 84 U/L (ref 35–144)
BUN/Creatinine Ratio: 21 (calc) (ref 6–22)
BUN: 36 mg/dL — ABNORMAL HIGH (ref 7–25)
CO2: 25 mmol/L (ref 20–32)
Calcium: 9.2 mg/dL (ref 8.6–10.3)
Chloride: 104 mmol/L (ref 98–110)
Creat: 1.68 mg/dL — ABNORMAL HIGH (ref 0.70–1.33)
Globulin: 3 g/dL (calc) (ref 1.9–3.7)
Glucose, Bld: 92 mg/dL (ref 65–99)
Potassium: 4.3 mmol/L (ref 3.5–5.3)
Sodium: 139 mmol/L (ref 135–146)
Total Bilirubin: 0.7 mg/dL (ref 0.2–1.2)
Total Protein: 6.7 g/dL (ref 6.1–8.1)

## 2021-04-10 ENCOUNTER — Other Ambulatory Visit (HOSPITAL_COMMUNITY): Payer: Self-pay

## 2021-05-09 ENCOUNTER — Encounter: Payer: Self-pay | Admitting: Emergency Medicine

## 2021-05-09 ENCOUNTER — Other Ambulatory Visit: Payer: Self-pay

## 2021-05-09 ENCOUNTER — Emergency Department
Admission: EM | Admit: 2021-05-09 | Discharge: 2021-05-09 | Disposition: A | Discharge status: Home / Self Care | Payer: 59 | Attending: Emergency Medicine | Admitting: Emergency Medicine

## 2021-05-09 ENCOUNTER — Emergency Department: Payer: 59

## 2021-05-09 ENCOUNTER — Telehealth: Payer: Self-pay

## 2021-05-09 ENCOUNTER — Ambulatory Visit
Admission: EM | Admit: 2021-05-09 | Discharge: 2021-05-09 | Disposition: A | Discharge status: Home / Self Care | Payer: 59

## 2021-05-09 DIAGNOSIS — J449 Chronic obstructive pulmonary disease, unspecified: Secondary | ICD-10-CM | POA: Insufficient documentation

## 2021-05-09 DIAGNOSIS — Z8616 Personal history of COVID-19: Secondary | ICD-10-CM | POA: Insufficient documentation

## 2021-05-09 DIAGNOSIS — I82532 Chronic embolism and thrombosis of left popliteal vein: Secondary | ICD-10-CM | POA: Diagnosis not present

## 2021-05-09 DIAGNOSIS — Z86718 Personal history of other venous thrombosis and embolism: Secondary | ICD-10-CM | POA: Diagnosis not present

## 2021-05-09 DIAGNOSIS — Z79899 Other long term (current) drug therapy: Secondary | ICD-10-CM | POA: Insufficient documentation

## 2021-05-09 DIAGNOSIS — I129 Hypertensive chronic kidney disease with stage 1 through stage 4 chronic kidney disease, or unspecified chronic kidney disease: Secondary | ICD-10-CM | POA: Diagnosis not present

## 2021-05-09 DIAGNOSIS — M79605 Pain in left leg: Secondary | ICD-10-CM

## 2021-05-09 DIAGNOSIS — Z7901 Long term (current) use of anticoagulants: Secondary | ICD-10-CM | POA: Diagnosis not present

## 2021-05-09 DIAGNOSIS — Z86711 Personal history of pulmonary embolism: Secondary | ICD-10-CM | POA: Diagnosis not present

## 2021-05-09 DIAGNOSIS — R0602 Shortness of breath: Secondary | ICD-10-CM

## 2021-05-09 DIAGNOSIS — M79662 Pain in left lower leg: Secondary | ICD-10-CM | POA: Diagnosis present

## 2021-05-09 DIAGNOSIS — I83812 Varicose veins of left lower extremities with pain: Secondary | ICD-10-CM | POA: Diagnosis not present

## 2021-05-09 DIAGNOSIS — N183 Chronic kidney disease, stage 3 unspecified: Secondary | ICD-10-CM | POA: Insufficient documentation

## 2021-05-09 NOTE — ED Notes (Signed)
See triage note  Presents with possible blood clot  Sent from [facility] with left leg apin and swelling  Hx of DVT

## 2021-05-09 NOTE — Discharge Instructions (Signed)
Due to your history of pulmonary embolism and deep vein thromboses, go to the emergency department for evaluation of your leg pain and shortness of breath.

## 2021-05-09 NOTE — ED Triage Notes (Signed)
Pt went to walk in clinic today and was told to come to ED for possible clot, pt with hx of blood clots and is on eliquis. Pt with left leg swelling and pain, pt states it hurts to walk, pt with veins swollen on left leg. Pt able to walk to room with some discomfort per pt.

## 2021-05-09 NOTE — ED Provider Notes (Signed)
Jonathon Snow    CSN: [id number] Arrival date & time: 05/09/21  1058      History   Chief Complaint Chief Complaint  Patient presents with  . Nasal Congestion  . Leg Swelling    HPI Jonathon Snow is a 60 y.o. male.   Patient presents with pain and swelling in his left leg for 2 weeks.  He also reports ongoing shortness of breath.  He also reports watery eyes, sneezing, nasal congestion for 1 week.  He denies fever, chills, ear pain, diarrhea, or other symptoms.  His medical history includes pulmonary embolism, chronic DVT, chronic venous insufficiency, anticoagulant use, presence of IVC filter, hypertension, CKD, COPD, lupus, morbid obesity, severe depression.  The history is provided by the patient and medical records.    Past Medical History:  Diagnosis Date  . Calculus of kidney 08/21/2013  . 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 COVID-19 virus infection 12/2020  . Depression   . DVT (deep venous thrombosis) ([place])   . GERD (gastroesophageal reflux disease)   . Heel spur, left 02/16/2019  . Heel spur, right 02/16/2019  . Hyperlipidemia   . Hypertension   . Lupus ([place])   . Lymphedema 10/07/2018  . Morbid obesity ([place])   . Opiate abuse, episodic ([place]) 02/26/2018  . Osteoporosis   . Postphlebitic syndrome with ulcer, left ([place]) 11/18/2016  . Presence of IVC filter 03/22/2020  . Pulmonary embolism ([place])   . Renal disorder    Stage III    Patient Active Problem List   Diagnosis Date Noted  . Hypertension   . Morbid obesity ([place])   . COVID-19 virus infection 12/2020  . Prediabetes 11/08/2020  . Stage 3a chronic kidney disease ([place]) 11/08/2020  . Seasonal allergies 11/08/2020  . Rhinosinusitis 11/08/2020  . Long term current use of systemic steroids 11/08/2020  . Anticoagulant disorder ([place]) 11/07/2020  . Senile purpura ([place]) 11/07/2020  . History of DVT (deep vein  thrombosis) 03/22/2020  . Severe episode of recurrent major depressive disorder, without psychotic features ([place]) 09/12/2019  . 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 Osteoporosis 10/12/2018  . Chronic venous insufficiency 10/07/2018  . Syncope 12/29/2017  . 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 ([place]) 10/29/2016  . Mixed hyperlipidemia 01/15/2016  . Essential hypertension 01/15/2016  . Obesity (BMI 30.0-34.9) 01/15/2016  . Long term current use of anticoagulant 10/04/2015  . 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 Pulmonary embolism ([place]) 05/30/2015  . COPD, moderate ([place]) 06/27/2014  . History of pulmonary embolism 12/11/2013  . Nodule of right lung 12/11/2013  . 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 ANKLE SURGERY Right   . COLONOSCOPY WITH PROPOFOL N/A 05/28/2020   Procedure: COLONOSCOPY WITH PROPOFOL;  Surgeon: Jonathon Bellows, MD;  Location: [facility] ENDOSCOPY;  Service: Endoscopy;  Laterality: N/A;  . CYST EXCISION  92 or 93    Liver cyst removal [facility]  . I & D EXTREMITY Right 04/29/2017   Procedure: IRRIGATION AND DEBRIDEMENT EXTREMITY;  Surgeon: Clayburn Pert, MD;  Location: [facility] ORS;  Service: General;  Laterality: Right;  . IRRIGATION AND DEBRIDEMENT ABSCESS Left 04/29/2017   Procedure: IRRIGATION AND DEBRIDEMENT Scrotal ABSCESS;  Surgeon: Clayburn Pert, MD;  Location: [facility] ORS;  Service: General;  Laterality: Left;  Home Medications    Prior to Admission medications   Medication Sig Start Date End Date Taking? Authorizing Provider  amLODipine (NORVASC) 5 MG tablet Take 1 tablet (5 mg total) by mouth daily. 11/07/20  Yes Delsa Grana, PA-C  apixaban (ELIQUIS) 5 MG TABS tablet Take 1 tablet (5 mg total) by mouth 2 (two) times daily. 01/17/20  Yes Delsa Grana, PA-C  losartan  (COZAAR) 100 MG tablet Take 1 tablet (100 mg total) by mouth daily. 11/07/20  Yes Delsa Grana, PA-C  Mepolizumab (NUCALA) 100 MG/ML SOAJ Inject into the skin. 05/02/20  Yes [provider]  montelukast (SINGULAIR) 10 MG tablet Take 1 tablet (10 mg total) by mouth at bedtime. 11/07/20  Yes Delsa Grana, PA-C  Multiple Vitamins-Minerals (MULTIVITAMIN WITH MINERALS) tablet Take 1 tablet by mouth daily.   Yes [provider]  mycophenolate (CELLCEPT) 500 MG tablet Take 1,000 mg by mouth 2 (two) times daily.   Yes [provider]  predniSONE (DELTASONE) 10 MG tablet Take 10 mg by mouth daily with breakfast.   Yes Dagoberto Ligas, MD  rosuvastatin (CRESTOR) 20 MG tablet Take one pill every other night before bed. 03/19/21  Yes Pollak, Adriana M, PA-C  albuterol (VENTOLIN HFA) 108 (90 Base) MCG/ACT inhaler Inhale 2 puffs into the lungs every 6 (six) hours as needed for wheezing or shortness of breath. 01/17/20   Delsa Grana, PA-C  COVID-19 mRNA vaccine, Pfizer, 30 MCG/0.3ML injection USE AS DIRECTED 11/16/20 11/16/21  Carlyle Basques, MD  ipratropium-albuterol (DUONEB) 0.5-2.5 (3) MG/3ML SOLN Take 3 mLs by nebulization every 4 (four) hours as needed. 01/17/20   Delsa Grana, PA-C  loratadine (CLARITIN) 10 MG tablet Take 1 tablet (10 mg total) by mouth daily. 11/07/20   Delsa Grana, PA-C  triamcinolone ointment (KENALOG) 0.1 % Apply 1 application topically 2 (two) times daily as needed. To affected rash/patch areas to leg for up to 1 week at a time 01/17/20   Delsa Grana, PA-C    Family History Family History  Problem Relation Age of Onset  . Hypertension Father   . Heart disease Father   . Clotting disorder Mother   . Kidney disease Brother   . Heart attack Maternal Grandmother   . Heart attack Maternal Grandfather   . Heart attack Paternal Grandfather     Social History Social History   Tobacco Use  . Smoking status: Never Smoker  . Smokeless tobacco: Never Used   Vaping Use  . Vaping Use: Never used  Substance Use Topics  . Alcohol use: No  . Drug use: No     Allergies   Hydrocodone and Vicodin [hydrocodone-acetaminophen]   Review of Systems Review of Systems  Constitutional: Negative for chills and fever.  HENT: Positive for congestion, rhinorrhea and sneezing. Negative for ear pain and sore throat.   Eyes: Negative for pain and visual disturbance.  Respiratory: Positive for shortness of breath. Negative for cough.   Cardiovascular: Negative for chest pain and palpitations.  Gastrointestinal: Negative for abdominal pain and vomiting.  Musculoskeletal: Positive for gait problem. Negative for back pain.       Left leg pain.  Skin: Negative for color change and rash.  Neurological: Negative for syncope, weakness and numbness.  All other systems reviewed and are negative.    Physical Exam Triage Vital Signs ED Triage Vitals  Enc Vitals Group     BP      Pulse      Resp      Temp  Temp src      SpO2      Weight      Height      Head Circumference      Peak Flow      Pain Score      Pain Loc      Pain Edu?      Excl. in [place]?    No data found.  Updated Vital Signs BP (!) 164/97 (BP Location: Left Arm)   Pulse 85   Temp 98.5 F (36.9 C) (Oral)   Resp 17   SpO2 96%   Visual Acuity Right Eye Distance:   Left Eye Distance:   Bilateral Distance:    Right Eye Near:   Left Eye Near:    Bilateral Near:     Physical Exam Vitals and nursing note reviewed.  Constitutional:      General: He is not in acute distress.    Appearance: He is well-developed.  HENT:     Head: Normocephalic and atraumatic.     Mouth/Throat:     Mouth: Mucous membranes are moist.  Eyes:     Conjunctiva/sclera: Conjunctivae normal.  Cardiovascular:     Rate and Rhythm: Normal rate and regular rhythm.     Heart sounds: Normal heart sounds.  Pulmonary:     Effort: Pulmonary effort is normal. No respiratory distress.     Breath sounds:  Normal breath sounds.  Abdominal:     Palpations: Abdomen is soft.     Tenderness: There is no abdominal tenderness.  Musculoskeletal:     Cervical back: Neck supple.  Skin:    General: Skin is warm and dry.     Comments: Numerous varicose veins in both legs.  Left lower leg appears mildly edematous.  Neurological:     General: No focal deficit present.     Mental Status: He is alert and oriented to person, place, and time.     Gait: Gait abnormal.     Comments: Limping gait.  Psychiatric:        Mood and Affect: Mood normal.        Behavior: Behavior normal.      UC Treatments / Results  Labs (all labs ordered are listed, but only abnormal results are displayed) Labs Reviewed - No data to display  EKG   Radiology No results found.  Procedures Procedures (including critical care time)  Medications Ordered in UC Medications - No data to display  Initial Impression / Assessment and Plan / UC Course  I have reviewed the triage vital signs and the nursing notes.  Pertinent labs & imaging results that were available during my care of the patient were reviewed by me and considered in my medical decision making (see chart for details).   Left leg pain, shortness of breath, history of embolism.  Patient has a history of pulmonary embolism and DVT.  I am concerned for recurrent DVT or PE.  Sending patient to the ED for evaluation.   Final Clinical Impressions(s) / UC Diagnoses   Final diagnoses:  Left leg pain  Shortness of breath  History of embolism     Discharge Instructions     Due to your history of pulmonary embolism and deep vein thromboses, go to the emergency department for evaluation of your leg pain and shortness of breath.        ED Prescriptions    None     PDMP not reviewed this encounter.   Sharion Balloon,  NP 05/09/21 1152

## 2021-05-09 NOTE — ED Notes (Signed)
Patient is being discharged from the Urgent Care and sent to the Emergency Department via personal vehicle . Per Claiborne Billings NP, patient is in need of higher level of care due to Leg Swelling and Medical History. Patient is aware and verbalizes understanding of plan of care.  Vitals:   05/09/21 1120  BP: (!) 164/97  Pulse: 85  Resp: 17  Temp: 98.5 F (36.9 C)  SpO2: 96%

## 2021-05-09 NOTE — ED Provider Notes (Signed)
[facility] Emergency Department Provider Note  ____________________________________________  Time seen: Approximately 4:43 PM  I have reviewed the triage vital signs and the nursing notes.   HISTORY  Chief Complaint Leg Pain    HPI Jonathon Snow is a 60 y.o. male who presents the emergency department with a concern for possible worsening DVT to left lower extremity.  Patient was seen at urgent care earlier today and was sent to the emergency department for further evaluation.  Patient presented with pain about the distal thigh extending through the knee and into the calf.  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  Patient has had some edema, no significant erythema but has had increased pain.  He is also noticed the presence of some varicose veins forming along the medial aspect of the knee.  He states that sometimes it is a throbbing/aching sensation, sometimes a burning sensation over the varicose veins.  Given the increased edema, pain he was referred from urgent care for evaluation for DVT.  He has no loss of sensation to the lower extremity.  No chest pain, shortness of breath, palpitations, pleuritic pain.  He has not missed any doses of his Eliquis.  No other complaints currently.         Past Medical History:  Diagnosis Date  . Calculus of kidney 08/21/2013  . 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 COVID-19 virus infection 12/2020  . Depression   . DVT (deep venous thrombosis) ([place])   . GERD (gastroesophageal reflux disease)   . Heel spur, left 02/16/2019  . Heel spur, right 02/16/2019  . Hyperlipidemia   . Hypertension   . Lupus ([place])   . Lymphedema 10/07/2018  . Morbid obesity ([place])   . Opiate abuse, episodic ([place]) 02/26/2018  . Osteoporosis   . Postphlebitic syndrome with ulcer, left ([place])  11/18/2016  . Presence of IVC filter 03/22/2020  . Pulmonary embolism ([place])   . Renal disorder    Stage III    Patient Active Problem List   Diagnosis Date Noted  . Hypertension   . Morbid obesity ([place])   . COVID-19 virus infection 12/2020  . Prediabetes 11/08/2020  . Stage 3a chronic kidney disease ([place]) 11/08/2020  . Seasonal allergies 11/08/2020  . Rhinosinusitis 11/08/2020  . Long term current use of systemic steroids 11/08/2020  . Anticoagulant disorder ([place]) 11/07/2020  . Senile purpura ([place]) 11/07/2020  . History of DVT (deep vein thrombosis) 03/22/2020  . Severe episode of recurrent major depressive disorder, without psychotic features ([place]) 09/12/2019  . 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 Osteoporosis 10/12/2018  . Chronic venous insufficiency 10/07/2018  . Syncope 12/29/2017  . 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 ([place]) 10/29/2016  . Mixed hyperlipidemia 01/15/2016  . Essential hypertension 01/15/2016  . Obesity (BMI 30.0-34.9) 01/15/2016  . Long term current use of anticoagulant 10/04/2015  . 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[place]) 05/30/2015  . Pulmonary embolism ([place]) 05/30/2015  . COPD, moderate ([place]) 06/27/2014  . History of pulmonary embolism 12/11/2013  . Nodule of right lung 12/11/2013  . 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 ANKLE SURGERY Right   . COLONOSCOPY WITH PROPOFOL N/A 05/28/2020   Procedure: COLONOSCOPY  WITH PROPOFOL;  Surgeon: Jonathon Bellows, MD;  Location: [facility] ENDOSCOPY;  Service: Endoscopy;  Laterality: N/A;  . CYST EXCISION  92 or 93    Liver cyst removal [facility]  . I & D EXTREMITY Right 04/29/2017   Procedure: IRRIGATION AND DEBRIDEMENT EXTREMITY;  Surgeon: Clayburn Pert, MD;  Location: [facility] ORS;  Service: General;  Laterality: Right;  . IRRIGATION AND  DEBRIDEMENT ABSCESS Left 04/29/2017   Procedure: IRRIGATION AND DEBRIDEMENT Scrotal ABSCESS;  Surgeon: Clayburn Pert, MD;  Location: [facility] ORS;  Service: General;  Laterality: Left;    Prior to Admission medications   Medication Sig Start Date End Date Taking? Authorizing Provider  albuterol (VENTOLIN HFA) 108 (90 Base) MCG/ACT inhaler Inhale 2 puffs into the lungs every 6 (six) hours as needed for wheezing or shortness of breath. 01/17/20   Delsa Grana, PA-C  amLODipine (NORVASC) 5 MG tablet Take 1 tablet (5 mg total) by mouth daily. 11/07/20   Delsa Grana, PA-C  apixaban (ELIQUIS) 5 MG TABS tablet Take 1 tablet (5 mg total) by mouth 2 (two) times daily. 01/17/20   Delsa Grana, PA-C  COVID-19 mRNA vaccine, Pfizer, 30 MCG/0.3ML injection USE AS DIRECTED 11/16/20 11/16/21  Carlyle Basques, MD  ipratropium-albuterol (DUONEB) 0.5-2.5 (3) MG/3ML SOLN Take 3 mLs by nebulization every 4 (four) hours as needed. 01/17/20   Delsa Grana, PA-C  loratadine (CLARITIN) 10 MG tablet Take 1 tablet (10 mg total) by mouth daily. 11/07/20   Delsa Grana, PA-C  losartan (COZAAR) 100 MG tablet Take 1 tablet (100 mg total) by mouth daily. 11/07/20   Delsa Grana, PA-C  Mepolizumab (NUCALA) 100 MG/ML SOAJ Inject into the skin. 05/02/20   [provider]  montelukast (SINGULAIR) 10 MG tablet Take 1 tablet (10 mg total) by mouth at bedtime. 11/07/20   Delsa Grana, PA-C  Multiple Vitamins-Minerals (MULTIVITAMIN WITH MINERALS) tablet Take 1 tablet by mouth daily.    [provider]  mycophenolate (CELLCEPT) 500 MG tablet Take 1,000 mg by mouth 2 (two) times daily.    [provider]  predniSONE (DELTASONE) 10 MG tablet Take 10 mg by mouth daily with breakfast.    Dagoberto Ligas, MD  rosuvastatin (CRESTOR) 20 MG tablet Take one pill every other night before bed. 03/19/21   Trinna Post, PA-C  triamcinolone ointment (KENALOG) 0.1 % Apply 1 application topically 2 (two) times daily as needed. To  affected rash/patch areas to leg for up to 1 week at a time 01/17/20   Delsa Grana, PA-C    Allergies Hydrocodone and Vicodin [hydrocodone-acetaminophen]  Family History  Problem Relation Age of Onset  . Hypertension Father   . Heart disease Father   . Clotting disorder Mother   . Kidney disease Brother   . Heart attack Maternal Grandmother   . Heart attack Maternal Grandfather   . Heart attack Paternal Grandfather     Social History Social History   Tobacco Use  . Smoking status: Never Smoker  . Smokeless tobacco: Never Used  Vaping Use  . Vaping Use: Never used  Substance Use Topics  . Alcohol use: No  . Drug use: No     Review of Systems  Constitutional: No fever/chills Eyes: No visual changes. No discharge ENT: No upper respiratory complaints. Cardiovascular: no chest pain. Respiratory: no cough. No SOB. Gastrointestinal: No abdominal pain.  No nausea, no vomiting.  No diarrhea.  No constipation. Musculoskeletal: Left lower extremity pain and edema Skin: Negative for rash, abrasions, lacerations, ecchymosis. Neurological: Negative for  headaches, focal weakness or numbness.  10 System ROS otherwise negative.  ____________________________________________   PHYSICAL EXAM:  VITAL SIGNS: ED Triage Vitals  Enc Vitals Group     BP 05/09/21 1350 (!) 151/82     Pulse Rate 05/09/21 1350 76     Resp 05/09/21 1350 18     Temp 05/09/21 1350 98.1 F (36.7 C)     Temp Source 05/09/21 1350 Oral     SpO2 05/09/21 1350 97 %     Weight 05/09/21 1348 250 lb (113.4 kg)     Height 05/09/21 1348 6\' 2"  (1.88 m)     Head Circumference --      Peak Flow --      Pain Score 05/09/21 1347 6     Pain Loc --      Pain Edu? --      Excl. in [place]? --      Constitutional: Alert and oriented. Well appearing and in no acute distress. Eyes: Conjunctivae are normal. PERRL. EOMI. Head: Atraumatic. ENT:      Ears:       Nose: No congestion/rhinnorhea.      Mouth/Throat: Mucous  membranes are moist.  Neck: No stridor.    Cardiovascular: Normal rate, regular rhythm. Normal S1 and S2.  Good peripheral circulation. Respiratory: Normal respiratory effort without tachypnea or retractions. Lungs CTAB. Good air entry to the bases with no decreased or absent breath sounds. Musculoskeletal: Full range of motion to all extremities. No gross deformities appreciated.  Visualization of the left lower extremity reveals edema about the left lower extremity when compared with right.  There also is identified varicose veins along the medial aspect of the distal thigh and proximal tibial region.  There is mild increased erythema of the left lower extremity diffusely when compared with right, patient states that this is at baseline.  No overlying skin changes to be consistent with cellulitis.  There is tenderness about the quadrant extending into the popliteal fossa and along the medial aspect of the knee.  Patient states that the pain to the thigh and popliteal region is more a pressure sensation where the palpation along the medial aspect over the varicose veins present are more a burning sensation consistent with his increased pain.  There is no tenderness over the calf.  Dorsalis pedis pulse intact distally.  Sensation intact distally. Neurologic:  Normal speech and language. No gross focal neurologic deficits are appreciated.  Skin:  Skin is warm, dry and intact. No rash noted. Psychiatric: Mood and affect are normal. Speech and behavior are normal. Patient exhibits appropriate insight and judgement.   ____________________________________________   LABS (all labs ordered are listed, but only abnormal results are displayed)  Labs Reviewed - No data to display ____________________________________________  EKG   ____________________________________________  RADIOLOGY I personally viewed and evaluated these images as part of my medical decision making, as well as reviewing the written  report by the radiologist.  ED Provider Interpretation: Findings consistent with nonocclusive DVT  US Venous Img Lower Unilateral Left  Result Date: 05/09/2021 CLINICAL DATA:  Left lower extremity pain and edema. History of previous DVT. Evaluate for acute or chronic DVT. EXAM: LEFT LOWER EXTREMITY VENOUS DOPPLER ULTRASOUND TECHNIQUE: Gray-scale sonography with graded compression, as well as color Doppler and duplex ultrasound were performed to evaluate the lower extremity deep venous systems from the level of the common femoral vein and including the common femoral, femoral, profunda femoral, popliteal and calf veins including the posterior tibial,  peroneal and gastrocnemius veins when visible. The superficial great saphenous vein was also interrogated. Spectral Doppler was utilized to evaluate flow at rest and with distal augmentation maneuvers in the common femoral, femoral and popliteal veins. COMPARISON:  Left lower extremity venous Doppler ultrasound-08/14/2013 (positive for mixed occlusive and nonocclusive DVT involving the left common femoral, femoral and popliteal veins) FINDINGS: Contralateral Common Femoral Vein: Respiratory phasicity is normal and symmetric with the symptomatic side. No evidence of thrombus. Normal compressibility. Common Femoral Vein: No evidence of acute or chronic thrombus. Normal compressibility, respiratory phasicity and response to augmentation. Saphenofemoral Junction: No evidence of thrombus. Normal compressibility and flow on color Doppler imaging. Profunda Femoral Vein: No evidence of thrombus. Normal compressibility and flow on color Doppler imaging. Femoral Vein: No evidence of acute or chronic thrombus. Normal compressibility, respiratory phasicity and response to augmentation. Popliteal Vein: There is mixed echogenic near occlusive thrombus involving the distal aspect of the left popliteal vein (images 26, 28 and 31) Calf Veins: There is hypoechoic occlusive thrombus  involving one of the paired left posterior tibial veins (image 35). The peroneal veins appear patent where imaged. Superficial Great Saphenous Vein: No evidence of thrombus. Normal compressibility. Other Findings:  None. IMPRESSION: The examination is positive for near occlusive DVT involving the distal aspect of the left popliteal vein as well as occlusive thrombus involving one of the paired left posterior tibial veins - while potentially chronic in etiology, in the absence of more recent prior comparison, an acute on chronic process is not excluded. Clinical correlation is advised. Electronically Signed   By: Sandi Mariscal M.D.   On: 05/09/2021 15:26    ____________________________________________    PROCEDURES  Procedure(s) performed:    Procedures    Medications - No data to display   ____________________________________________   INITIAL IMPRESSION / ASSESSMENT AND PLAN / ED COURSE  Pertinent labs & imaging results that were available during my care of the patient were reviewed by me and considered in my medical decision making (see chart for details).  Review of the [place] CSRS was performed in accordance of the [place] prior to dispensing any controlled drugs.           Patient's diagnosis is consistent with chronic DVT.  Patient presented to emergency department with pain and swelling of the left lower extremity.  Patient has a known chronic DVT to left lower extremity and is currently on Eliquis with IVC filter in place.  Patient had some pain, edema to the left lower extremity between the distal thigh and calf region.  Palpation of the area revealed some tenderness but more tenderness over the varicose veins along the medial aspect of the knee.  Ultrasound revealed a nonocclusive DVT but was not conclusive regarding acute versus chronic.  At this time given the nature of worsening symptoms I discussed the case with on-call vascular surgeon, Dr. Lucky Cowboy.  At this time vascular surgery  advises that this is most likely chronic as it is nonocclusive in nature.  There is no indication for changing of anticoagulation versus adding an antiplatelet like aspirin.  Patient may follow-up with vascular surgery for management of his varicose veins which I think is contributing to the patient's pain.  Patient is on CellCept and there is very minimal erythema of the leg when compared with right.  However patient states that this is chronic with no appreciable changes per the patient.  There is no skin changes to be concern for cellulitis/abscess formation.  At this time  patient with no erythema out of the ordinary for him, there is no warmth to palpation, there is no overlying skin changes.  I will not add antibiotics as I believe that there is low risk for any infectious source of patients symptoms.  At this time patient will follow-up with vascular surgery as described above.  No additional medications at this time..  Patient is given ED precautions to return to the ED for any worsening or new symptoms.     ____________________________________________  FINAL CLINICAL IMPRESSION(S) / ED DIAGNOSES  Final diagnoses:  Chronic deep vein thrombosis (DVT) of popliteal vein of left lower extremity (HCC)  Varicose veins of left lower extremity with pain      NEW MEDICATIONS STARTED DURING THIS VISIT:  ED Discharge Orders    None          This chart was dictated using voice recognition software/Dragon. Despite best efforts to proofread, errors can occur which can change the meaning. Any change was purely unintentional.    Darletta Moll, PA-C 05/09/21 Kris Mouton, MD 05/14/21 2058

## 2021-05-09 NOTE — Telephone Encounter (Signed)
Pt informed via voicemail Copied from [place] [id number]. Topic: General - Inquiry >> May 09, 2021  8:27 AM Oneta Rack wrote: Reason for CRM: Patient currently at [facility] and there requiring a referral for him to be seen to address allergies possible pneumonia and swollen legs. Patient requesting this referral be placed as soon as possible. Patient does not understand why a referral is needed. Please follow up

## 2021-05-09 NOTE — ED Triage Notes (Signed)
Patient c/o nasal congestion and sneezing x 1 week.    Patient denies fever at home. Patient endorses a temperature of 97.6 F.   Patient endorses a nonproductive cough. Patient endorses watery eyes.   Patient endorses "chronic SOB".   Patient has a history of severe Asthma.   Patient has taken prednisone and OTC "allergy medications" with no relief of symptoms.   Patient c/o bilateral leg swelling x 2 weeks.   Patient denies Chest Pain, Headache, or Dizziness.   Patient endorses taking Eliquis.   Patient endorses history of blood clots.   Patient has history of kidney disease and Lupus.

## 2021-05-10 ENCOUNTER — Telehealth: Payer: Self-pay

## 2021-05-10 NOTE — Telephone Encounter (Signed)
Copied from [place] [id number]. Topic: General - Inquiry >> May 09, 2021  8:27 AM Oneta Rack wrote: Reason for CRM: Patient currently at [facility] and there requiring a referral for him to be seen to address allergies possible pneumonia and swollen legs. Patient requesting this referral be placed as soon as possible. Patient does not understand why a referral is needed. Please follow up >> May 10, 2021  8:30 AM Leward Quan A wrote: Patient called in to inquire of Delsa Grana what she can prescribe him for a cough with congestion and sinus drainage. Went to urgent care on 05/09/21 but nothing was done and [name] first visit is 05/29/21. Please call  Ph# [phone]

## 2021-05-13 NOTE — Telephone Encounter (Signed)
Called NA 

## 2021-05-13 NOTE — Telephone Encounter (Signed)
Called [facility] In to see what they need. No answer

## 2021-05-15 ENCOUNTER — Ambulatory Visit: Payer: 59 | Admitting: Family Medicine

## 2021-05-15 ENCOUNTER — Telehealth: Payer: Self-pay

## 2021-05-15 DIAGNOSIS — Z86718 Personal history of other venous thrombosis and embolism: Secondary | ICD-10-CM

## 2021-05-15 NOTE — Telephone Encounter (Signed)
Pt had an appt with [name] this afternoon for a referral to [facility]. Appt was cancelled due to leisa not being in the office. Pt would like to see Dr Lucky Cowboy and have this done stat due to the amount of discomfort he is in. He did have a visit at the ER for this and they recommend him see Vein and vascular dr.

## 2021-05-15 NOTE — Telephone Encounter (Signed)
Called [facility] again, No one picked up phone

## 2021-05-27 ENCOUNTER — Encounter (INDEPENDENT_AMBULATORY_CARE_PROVIDER_SITE_OTHER): Payer: 59 | Admitting: Unknown Physician Specialty

## 2021-05-27 ENCOUNTER — Other Ambulatory Visit: Payer: Self-pay

## 2021-05-27 ENCOUNTER — Emergency Department
Admission: EM | Admit: 2021-05-27 | Discharge: 2021-05-27 | Disposition: A | Discharge status: Home / Self Care | Payer: 59 | Attending: Emergency Medicine | Admitting: Emergency Medicine

## 2021-05-27 ENCOUNTER — Encounter: Payer: Self-pay | Admitting: Emergency Medicine

## 2021-05-27 ENCOUNTER — Ambulatory Visit: Payer: Self-pay | Admitting: *Deleted

## 2021-05-27 ENCOUNTER — Emergency Department: Payer: 59

## 2021-05-27 ENCOUNTER — Telehealth: Payer: Self-pay

## 2021-05-27 DIAGNOSIS — I129 Hypertensive chronic kidney disease with stage 1 through stage 4 chronic kidney disease, or unspecified chronic kidney disease: Secondary | ICD-10-CM | POA: Insufficient documentation

## 2021-05-27 DIAGNOSIS — J181 Lobar pneumonia, unspecified organism: Secondary | ICD-10-CM | POA: Insufficient documentation

## 2021-05-27 DIAGNOSIS — Z7901 Long term (current) use of anticoagulants: Secondary | ICD-10-CM | POA: Insufficient documentation

## 2021-05-27 DIAGNOSIS — J189 Pneumonia, unspecified organism: Secondary | ICD-10-CM

## 2021-05-27 DIAGNOSIS — J449 Chronic obstructive pulmonary disease, unspecified: Secondary | ICD-10-CM | POA: Diagnosis not present

## 2021-05-27 DIAGNOSIS — Z8616 Personal history of COVID-19: Secondary | ICD-10-CM | POA: Insufficient documentation

## 2021-05-27 DIAGNOSIS — Z79899 Other long term (current) drug therapy: Secondary | ICD-10-CM | POA: Diagnosis not present

## 2021-05-27 DIAGNOSIS — N183 Chronic kidney disease, stage 3 unspecified: Secondary | ICD-10-CM | POA: Insufficient documentation

## 2021-05-27 DIAGNOSIS — R059 Cough, unspecified: Secondary | ICD-10-CM | POA: Diagnosis present

## 2021-05-27 MED ORDER — HYDROCOD POLST-CPM POLST ER 10-8 MG/5ML PO SUER: 5.0000 mL | Freq: Every evening | ORAL | 0 refills | Status: DC | PRN

## 2021-05-27 MED ORDER — DOXYCYCLINE MONOHYDRATE 100 MG PO CAPS: 100.0000 mg | ORAL_CAPSULE | Freq: Two times a day (BID) | ORAL | 0 refills | Status: DC

## 2021-05-27 MED ORDER — BENZONATATE 100 MG PO CAPS: 200.0000 mg | ORAL_CAPSULE | Freq: Three times a day (TID) | ORAL | 0 refills | Status: DC | PRN

## 2021-05-27 NOTE — Telephone Encounter (Signed)
Pt has virtual appt sch'd with Malachy Mood for today Copied from [place] [id number]. Topic: General - Other >> May 27, 2021  7:58 AM Leward Quan A wrote: Reason for CRM: Patient called in and was quite irritated because he was Dxed with Covid say that he feel like its turning into Pneumonia say that he have cough headache, chest congestion sinus drainage and congestion. Asking for a call back please at  Ph# [phone]

## 2021-05-27 NOTE — ED Provider Notes (Signed)
[facility] Emergency Department Provider Note   ____________________________________________   Event Date/Time   First MD Initiated Contact with Patient 05/27/21 [phone]     (approximate)  I have reviewed the triage vital signs and the nursing notes.   HISTORY  Chief Complaint Cough and Covid Positive    HPI Jonathon Snow is a 60 y.o. male patient was diagnosed with COVID-19 5 days ago.  Patient stated history of continual cough nasal congestion and mild dyspnea.  Patient has history of COPD and DVT.  Patient is on blood thinner..  Patient voices concerned for pneumonia.  Denies fever associated with complaint.  Rates pain as a 7/10.  Described pain as "achy".  No palliative measure for complaint.         Past Medical History:  Diagnosis Date  . Calculus of kidney 08/21/2013  . 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 COVID-19 virus infection 12/2020  . Depression   . DVT (deep venous thrombosis) ([place])   . GERD (gastroesophageal reflux disease)   . Heel spur, left 02/16/2019  . Heel spur, right 02/16/2019  . Hyperlipidemia   . Hypertension   . Lupus ([place])   . Lymphedema 10/07/2018  . Morbid obesity ([place])   . Opiate abuse, episodic ([place]) 02/26/2018  . Osteoporosis   . Postphlebitic syndrome with ulcer, left ([place]) 11/18/2016  . Presence of IVC filter 03/22/2020  . Pulmonary embolism ([place])   . Renal disorder    Stage III    Patient Active Problem List   Diagnosis Date Noted  . Hypertension   . Morbid obesity ([place])   . COVID-19 virus infection 12/2020  . Prediabetes 11/08/2020  . Stage 3a chronic kidney disease ([place]) 11/08/2020  . Seasonal allergies 11/08/2020  . Rhinosinusitis 11/08/2020  . Long term current use of systemic steroids 11/08/2020  . Anticoagulant disorder ([place]) 11/07/2020  . Senile purpura ([place]) 11/07/2020  . History of DVT (deep vein  thrombosis) 03/22/2020  . Severe episode of recurrent major depressive disorder, without psychotic features ([place]) 09/12/2019  . 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 Osteoporosis 10/12/2018  . Chronic venous insufficiency 10/07/2018  . Syncope 12/29/2017  . 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 Mixed hyperlipidemia 01/15/2016  . Essential hypertension 01/15/2016  . Obesity (BMI 30.0-34.9) 01/15/2016  . Long term current use of anticoagulant 10/04/2015  . 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[place]) 05/30/2015  . Pulmonary embolism ([place]) 05/30/2015  . COPD, moderate ([place]) 06/27/2014  . History of pulmonary embolism 12/11/2013  . Nodule of right lung 12/11/2013  . 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 ANKLE SURGERY Right   . COLONOSCOPY WITH PROPOFOL N/A 05/28/2020   Procedure: COLONOSCOPY WITH PROPOFOL;  Surgeon: Jonathon Bellows, MD;  Location: [facility] ENDOSCOPY;  Service: Endoscopy;  Laterality: N/A;  . CYST EXCISION  92 or 93    Liver cyst removal [facility]  . I & D EXTREMITY Right 04/29/2017   Procedure: IRRIGATION AND DEBRIDEMENT EXTREMITY;  Surgeon: Clayburn Pert, MD;  Location: [facility] ORS;  Service: General;  Laterality: Right;  . IRRIGATION AND DEBRIDEMENT ABSCESS Left 04/29/2017   Procedure: IRRIGATION AND DEBRIDEMENT Scrotal ABSCESS;  Surgeon: Clayburn Pert, MD;  Location: [facility] ORS;  Service: General;  Laterality: Left;  Prior to Admission medications   Medication Sig Start Date End Date Taking? Authorizing Provider  benzonatate (TESSALON PERLES) 100 MG capsule Take 2 capsules (200 mg total) by mouth 3 (three) times daily as needed. 05/27/21 05/27/22 Yes Sable Feil, PA-C  chlorpheniramine-HYDROcodone (TUSSIONEX PENNKINETIC ER) 10-8 MG/5ML SUER Take 5 mLs by mouth at bedtime as needed for  cough. 05/27/21  Yes Sable Feil, PA-C  doxycycline (MONODOX) 100 MG capsule Take 1 capsule (100 mg total) by mouth 2 (two) times daily. 05/27/21  Yes Sable Feil, PA-C  albuterol (VENTOLIN HFA) 108 (90 Base) MCG/ACT inhaler Inhale 2 puffs into the lungs every 6 (six) hours as needed for wheezing or shortness of breath. 01/17/20   Delsa Grana, PA-C  amLODipine (NORVASC) 5 MG tablet Take 1 tablet (5 mg total) by mouth daily. 11/07/20   Delsa Grana, PA-C  apixaban (ELIQUIS) 5 MG TABS tablet Take 1 tablet (5 mg total) by mouth 2 (two) times daily. 01/17/20   Delsa Grana, PA-C  COVID-19 mRNA vaccine, Pfizer, 30 MCG/0.3ML injection USE AS DIRECTED 11/16/20 11/16/21  Carlyle Basques, MD  ipratropium-albuterol (DUONEB) 0.5-2.5 (3) MG/3ML SOLN Take 3 mLs by nebulization every 4 (four) hours as needed. 01/17/20   Delsa Grana, PA-C  loratadine (CLARITIN) 10 MG tablet Take 1 tablet (10 mg total) by mouth daily. 11/07/20   Delsa Grana, PA-C  losartan (COZAAR) 100 MG tablet Take 1 tablet (100 mg total) by mouth daily. 11/07/20   Delsa Grana, PA-C  Mepolizumab (NUCALA) 100 MG/ML SOAJ Inject into the skin. 05/02/20   [provider]  montelukast (SINGULAIR) 10 MG tablet Take 1 tablet (10 mg total) by mouth at bedtime. 11/07/20   Delsa Grana, PA-C  Multiple Vitamins-Minerals (MULTIVITAMIN WITH MINERALS) tablet Take 1 tablet by mouth daily.    [provider]  mycophenolate (CELLCEPT) 500 MG tablet Take 1,000 mg by mouth 2 (two) times daily.    [provider]  predniSONE (DELTASONE) 10 MG tablet Take 10 mg by mouth daily with breakfast.    Dagoberto Ligas, MD  rosuvastatin (CRESTOR) 20 MG tablet Take one pill every other night before bed. 03/19/21   Trinna Post, PA-C  triamcinolone ointment (KENALOG) 0.1 % Apply 1 application topically 2 (two) times daily as needed. To affected rash/patch areas to leg for up to 1 week at a time 01/17/20   Delsa Grana, PA-C     Allergies Hydrocodone and Vicodin [hydrocodone-acetaminophen]  Family History  Problem Relation Age of Onset  . Hypertension Father   . Heart disease Father   . Clotting disorder Mother   . Kidney disease Brother   . Heart attack Maternal Grandmother   . Heart attack Maternal Grandfather   . Heart attack Paternal Grandfather     Social History Social History   Tobacco Use  . Smoking status: Never Smoker  . Smokeless tobacco: Never Used  Vaping Use  . Vaping Use: Never used  Substance Use Topics  . Alcohol use: No  . Drug use: No    Review of Systems Constitutional: No fever/chills Eyes: No visual changes. ENT: No sore throat. Cardiovascular: Denies chest pain. Respiratory: Denies shortness of breath. Gastrointestinal: No abdominal pain.  No nausea, no vomiting.  No diarrhea.  No constipation. Genitourinary: Negative for dysuria. Musculoskeletal: Negative for back pain. Skin: Negative for rash. Neurological: Negative for headaches, focal weakness or numbness. Psychiatric:  Depression Endocrine:  Hyperlipidemia and hypertension. Allergic/Immunilogical: Vicodin  ____________________________________________   PHYSICAL EXAM:  VITAL  SIGNS: ED Triage Vitals  Enc Vitals Group     BP 05/27/21 0806 (!) 169/102     Pulse Rate 05/27/21 0806 84     Resp 05/27/21 0806 20     Temp 05/27/21 0806 97.8 F (36.6 C)     Temp Source 05/27/21 0806 Oral     SpO2 05/27/21 0806 98 %     Weight 05/27/21 0806 250 lb (113.4 kg)     Height 05/27/21 0806 6\' 2"  (1.88 m)     Head Circumference --      Peak Flow --      Pain Score 05/27/21 0806 7     Pain Loc --      Pain Edu? --      Excl. in [place]? --     Constitutional: Alert and oriented. Well appearing and in no acute distress. Nose: Tenderness nasal turbinates clear rhinorrhea. Mouth/Throat: Mucous membranes are moist.  Oropharynx non-erythematous. Neck: No stridor. Hematological/Lymphatic/Immunilogical: No cervical  lymphadenopathy. Cardiovascular: Normal rate, regular rhythm. Grossly normal heart sounds.  Good peripheral circulation. Respiratory: Normal respiratory effort.  No retractions. Lungs CTAB. Gastrointestinal: Soft and nontender. No distention. No abdominal bruits. No CVA tenderness. . Neurologic:  Normal speech and language. No gross focal neurologic deficits are appreciated. No gait instability. Skin:  Skin is warm, dry and intact. No rash noted. Psychiatric: Mood and affect are normal. Speech and behavior are normal.  ____________________________________________   LABS (all labs ordered are listed, but only abnormal results are displayed)  Labs Reviewed - No data to display ____________________________________________  EKG   ____________________________________________  RADIOLOGY I, Sable Feil, personally viewed and evaluated these images (plain radiographs) as part of my medical decision making, as well as reviewing the written report by the radiologist.  ED MD interpretation: Chest x-ray consistent with early left lower lobe pneumonia.  Official radiology report(s): DG Chest 2 View  Result Date: 05/27/2021 CLINICAL DATA:  Diagnosed with COVID-19 5 days ago, cough, headache, chest congestion, sinus straightening check congestion, question pneumonia EXAM: CHEST - 2 VIEW COMPARISON:  01/17/2020 FINDINGS: Normal heart size, mediastinal contours, and pulmonary vascularity. Emphysematous changes with atelectasis versus scarring at lateral RIGHT lung base. Question minimal LEFT basilar infiltrate. Remaining lungs clear. No additional pulmonary infiltrate, pleural effusion, or pneumothorax. Diffuse osseous demineralization. IMPRESSION: Emphysematous changes with increased atelectasis versus scarring at lateral RIGHT lung base. Question minimal LEFT basilar infiltrate. Electronically Signed   By: Lavonia Dana M.D.   On: 05/27/2021 09:05     ____________________________________________   PROCEDURES  Procedure(s) performed (including Critical Care):  Procedures   ____________________________________________   INITIAL IMPRESSION / ASSESSMENT AND PLAN / ED COURSE  As part of my medical decision making, I reviewed the following data within [place]         Patient presents with continued cough and mild dyspnea secondary diagnosis COVID-19.  Discussed chest x-ray with patient consistent with early left lower lobe pneumonia.  Patient is refusing Z-Pak.  Patient given discharge care instruction prescription with doxycycline, Tessalon Perles, and Tussionex.  Advised return to ED if condition worsens.     ____________________________________________   FINAL CLINICAL IMPRESSION(S) / ED DIAGNOSES  Final diagnoses:  Community acquired pneumonia of left lower lobe of lung     ED Discharge Orders         Ordered    doxycycline (MONODOX) 100 MG capsule  2 times daily        05/27/21 9323  benzonatate (TESSALON PERLES) 100 MG capsule  3 times daily PRN        05/27/21 5110    chlorpheniramine-HYDROcodone (TUSSIONEX PENNKINETIC ER) 10-8 MG/5ML SUER  At bedtime PRN        05/27/21 2111           Note:  This document was prepared using Dragon voice recognition software and may include unintentional dictation errors.    Sable Feil, PA-C 05/27/21 [place] Done    Blake Divine, MD 05/28/21 [phone]

## 2021-05-27 NOTE — Telephone Encounter (Signed)
Pt called but disconnected prior to transfer to nurse triage; attempted to contact pt; message states "voicemailis full"; unable to leave message on voicemail; he is seen at [facility]; will route to office for notification of encounter.

## 2021-05-27 NOTE — ED Triage Notes (Signed)
States diagnosed with COVID last Wednesday.  States "I am  Not getting any better".  C.O sinus congestion and SOB.  Is concerned he is developing pneumonia.  AAOx3.  Skin warm and dry. No SOB/ DOE.  NAD

## 2021-05-27 NOTE — Telephone Encounter (Signed)
Scheduled

## 2021-05-27 NOTE — ED Notes (Signed)
See triage note  States he tested positive for COVID 5 days ago  Sx's started 1 week prior  conts to have cough  Has used inhalers with min relief

## 2021-06-04 ENCOUNTER — Encounter (INDEPENDENT_AMBULATORY_CARE_PROVIDER_SITE_OTHER): Payer: Self-pay | Admitting: Vascular Surgery

## 2021-06-04 ENCOUNTER — Other Ambulatory Visit: Payer: Self-pay

## 2021-06-04 ENCOUNTER — Ambulatory Visit (INDEPENDENT_AMBULATORY_CARE_PROVIDER_SITE_OTHER): Payer: 59 | Admitting: Vascular Surgery

## 2021-06-04 VITALS — BP 146/83 | HR 91 | Resp 16 | Ht 74.0 in | Wt 237.4 lb

## 2021-06-04 DIAGNOSIS — I10 Essential (primary) hypertension: Secondary | ICD-10-CM

## 2021-06-04 DIAGNOSIS — E782 Mixed hyperlipidemia: Secondary | ICD-10-CM

## 2021-06-04 DIAGNOSIS — I825Y2 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: Secondary | ICD-10-CM | POA: Diagnosis not present

## 2021-06-04 DIAGNOSIS — R7303 Prediabetes: Secondary | ICD-10-CM | POA: Diagnosis not present

## 2021-06-04 NOTE — Assessment & Plan Note (Addendum)
blood glucose control important in reducing the progression of atherosclerotic disease. Also, involved in wound healing.   

## 2021-06-04 NOTE — Assessment & Plan Note (Signed)
blood pressure control important in reducing the progression of atherosclerotic disease. On appropriate oral medications.  

## 2021-06-04 NOTE — Assessment & Plan Note (Signed)
lipid control important in reducing the progression of atherosclerotic disease. Continue statin therapy  

## 2021-06-04 NOTE — Assessment & Plan Note (Signed)
Patient has some postphlebitic changes in the left lower extremity have symptoms that are worrisome for recent superficial thrombophlebitis.  He has more prominent and painful varicosities.  I think at this point, a reflux study of the left leg would be prudent to further evaluate the anatomy and determine further treatment options.  Continue his compression socks, elevation, and activity as tolerated.

## 2021-06-04 NOTE — Progress Notes (Signed)
Patient ID: Jonathon Snow, male   DOB: 09-02-1961, 60 y.o.   MRN: [id number]  Chief Complaint  Patient presents with   New Patient (Initial Visit)    Ref Lucio Edward history of DVT    HPI Jonathon Snow is a 60 y.o. male.  I am asked to see the patient by L [name] for evaluation of previous DVT and prominent varicosities with pain in the left lower extremity.  He had an episode several weeks ago that sounds worrisome for a superficial thrombophlebitis of some varicosities on the medial and anterior left thigh.  These became painful and warm as well as somewhat hard.  This has gradually improved but has not gotten back to normal.  These varicosities become much more prominent and noticeable over time.  The dark discoloration in his lower leg is also gotten more noticeable over time.  He has had more swelling as of late.     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   Overview:  superior sagittal sinus, left transverse sinus and cortical veins    COVID-19 virus infection 12/2020   Depression    DVT (deep venous thrombosis) (HCC)    GERD (gastroesophageal reflux disease)    Heel spur, left 02/16/2019   Heel spur, right 02/16/2019   Hyperlipidemia    Hypertension    Lupus ([place])    Lymphedema 10/07/2018   Morbid obesity ([place])    Opiate abuse, episodic ([place]) 02/26/2018   Osteoporosis    Postphlebitic syndrome with ulcer, 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   Presence of IVC filter 03/22/2020   Pulmonary embolism ([place])    Renal disorder    Stage III    Past Surgical History:  Procedure Laterality Date   ANKLE SURGERY Right    COLONOSCOPY WITH PROPOFOL N/A 05/28/2020   Procedure: COLONOSCOPY WITH PROPOFOL;  Surgeon: Jonathon Bellows, MD;  Location: [facility] ENDOSCOPY;  Service: Endoscopy;  Laterality: N/A;   CYST EXCISION  92 or 93    Liver cyst removal [facility]   I & D EXTREMITY Right 04/29/2017   Procedure: IRRIGATION AND DEBRIDEMENT EXTREMITY;   Surgeon: Clayburn Pert, MD;  Location: [facility] ORS;  Service: General;  Laterality: Right;   IRRIGATION AND DEBRIDEMENT ABSCESS Left 04/29/2017   Procedure: IRRIGATION AND DEBRIDEMENT Scrotal ABSCESS;  Surgeon: Clayburn Pert, MD;  Location: [facility] ORS;  Service: General;  Laterality: Left;     Family History  Problem Relation Age of Onset   Hypertension Father    Heart disease Father    Clotting disorder Mother    Kidney disease Brother    Heart attack Maternal Grandmother    Heart attack Maternal Grandfather    Heart attack Paternal Grandfather       Social History   Tobacco Use   Smoking status: Never   Smokeless tobacco: Never  Vaping Use   Vaping Use: Never used  Substance Use Topics   Alcohol use: No   Drug use: No     Allergies  Allergen Reactions   Hydrocodone-Acetaminophen    Hydrocodone Rash   Vicodin [Hydrocodone-Acetaminophen] Hives and Rash    Severe headaches (also)    Current Outpatient Medications  Medication Sig Dispense Refill   albuterol (VENTOLIN HFA) 108 (90 Base) MCG/ACT inhaler Inhale 2 puffs into the lungs every 6 (six) hours as needed for wheezing or shortness of breath. 18 g 2   amLODipine (NORVASC) 5 MG tablet Take 1  tablet (5 mg total) by mouth daily. 90 tablet 3   apixaban (ELIQUIS) 5 MG TABS tablet Take 1 tablet (5 mg total) by mouth 2 (two) times daily. 60 tablet 2   benzonatate (TESSALON PERLES) 100 MG capsule Take 2 capsules (200 mg total) by mouth 3 (three) times daily as needed. 30 capsule 0   COVID-19 mRNA vaccine, Pfizer, 30 MCG/0.3ML injection USE AS DIRECTED .3 mL 0   gabapentin (NEURONTIN) 100 MG capsule Take 100 mg by mouth 2 (two) times daily.     ipratropium-albuterol (DUONEB) 0.5-2.5 (3) MG/3ML SOLN Take 3 mLs by nebulization every 4 (four) hours as needed. 360 mL 3   losartan (COZAAR) 100 MG tablet Take 1 tablet (100 mg total) by mouth daily. 90 tablet 3   Mepolizumab (NUCALA) 100 MG/ML SOAJ Inject into the skin.      montelukast (SINGULAIR) 10 MG tablet Take 1 tablet (10 mg total) by mouth at bedtime. 90 tablet 3   Multiple Vitamins-Minerals (MULTIVITAMIN WITH MINERALS) tablet Take 1 tablet by mouth daily.     mycophenolate (CELLCEPT) 500 MG tablet Take 1,000 mg by mouth 2 (two) times daily.     predniSONE (DELTASONE) 10 MG tablet Take 10 mg by mouth daily with breakfast.     rosuvastatin (CRESTOR) 20 MG tablet Take one pill every other night before bed. 90 tablet 0   traMADol (ULTRAM) 50 MG tablet Take by mouth every 6 (six) hours as needed.     chlorpheniramine-HYDROcodone (TUSSIONEX PENNKINETIC ER) 10-8 MG/5ML SUER Take 5 mLs by mouth at bedtime as needed for cough. (Patient not taking: Reported on 06/04/2021) 70 mL 0   doxycycline (MONODOX) 100 MG capsule Take 1 capsule (100 mg total) by mouth 2 (two) times daily. (Patient not taking: Reported on 06/04/2021) 20 capsule 0   loratadine (CLARITIN) 10 MG tablet Take 1 tablet (10 mg total) by mouth daily. (Patient not taking: Reported on 06/04/2021) 90 tablet 3   triamcinolone ointment (KENALOG) 0.1 % Apply 1 application topically 2 (two) times daily as needed. To affected rash/patch areas to leg for up to 1 week at a time (Patient not taking: Reported on 06/04/2021) 30 g 0   No current facility-administered medications for this visit.      REVIEW OF SYSTEMS (Negative unless checked)  Constitutional: [] Weight loss  [] Fever  [] Chills Cardiac: [] Chest pain   [] Chest pressure   [] Palpitations   [] Shortness of breath when laying flat   [] Shortness of breath at rest   [] Shortness of breath with exertion. Vascular:  [] Pain in legs with walking   [] Pain in legs at rest   [] Pain in legs when laying flat   [] Claudication   [] Pain in feet when walking  [] Pain in feet at rest  [] Pain in feet when laying flat   [x] History of DVT   [x] Phlebitis   [x] Swelling in legs   [] Varicose veins   [] Non-healing ulcers Pulmonary:   [] Uses home oxygen   [] Productive cough   [] Hemoptysis    [] Wheeze  [] COPD   [] Asthma Neurologic:  [] Dizziness  [] Blackouts   [] Seizures   [] History of stroke   [] History of TIA  [] Aphasia   [] Temporary blindness   [] Dysphagia   [] Weakness or numbness in arms   [] Weakness or numbness in legs Musculoskeletal:  [x] Arthritis   [] Joint swelling   [x] Joint pain   [] Low back pain Hematologic:  [] Easy bruising  [] Easy bleeding   [] Hypercoagulable state   [] Anemic  [] Hepatitis Gastrointestinal:  [] Blood in  stool   [] Vomiting blood  [] Gastroesophageal reflux/heartburn   [] Abdominal pain Genitourinary:  [x] Chronic kidney disease   [] Difficult urination  [] Frequent urination  [] Burning with urination   [] Hematuria Skin:  [] Rashes   [] Ulcers   [] Wounds Psychological:  [] History of anxiety   [x]  History of major depression.    Physical Exam BP (!) 146/83 (BP Location: Right Arm)   Pulse 91   Resp 16   Ht 6\' 2"  (1.88 m)   Wt 237 lb 6.4 oz (107.7 kg)   BMI 30.48 kg/m  Gen:  WD/WN, NAD Head: [place]/AT, No temporalis wasting. Ear/Nose/Throat: Hearing grossly intact, nares w/o erythema or drainage, oropharynx w/o Erythema/Exudate Eyes: Conjunctiva clear, sclera non-icteric  Neck: trachea midline.  No JVD.  Pulmonary:  Good air movement, respirations not labored, no use of accessory muscles  Cardiac: RRR, no JVD Vascular:  Vessel Right Left  Radial Palpable Palpable                          DP 2+ 2+  PT 2+ 1+   Gastrointestinal:. No masses, surgical incisions, or scars. Musculoskeletal: M/S 5/5 throughout.  Extremities without ischemic changes.  No deformity or atrophy. Moderate stasis dermatitis of the left lower leg medially. 1+ LLE edema. Neurologic: Sensation grossly intact in extremities.  Symmetrical.  Speech is fluent. Motor exam as listed above. Psychiatric: Judgment intact, Mood & affect appropriate for pt's clinical situation. Dermatologic: No rashes or ulcers noted.  No cellulitis or open wounds.    Radiology DG Chest 2 View  Result  Date: 05/27/2021 CLINICAL DATA:  Diagnosed with COVID-19 5 days ago, cough, headache, chest congestion, sinus straightening check congestion, question pneumonia EXAM: CHEST - 2 VIEW COMPARISON:  01/17/2020 FINDINGS: Normal heart size, mediastinal contours, and pulmonary vascularity. Emphysematous changes with atelectasis versus scarring at lateral RIGHT lung base. Question minimal LEFT basilar infiltrate. Remaining lungs clear. No additional pulmonary infiltrate, pleural effusion, or pneumothorax. Diffuse osseous demineralization. IMPRESSION: Emphysematous changes with increased atelectasis versus scarring at lateral RIGHT lung base. Question minimal LEFT basilar infiltrate. Electronically Signed   By: Lavonia Dana M.D.   On: 05/27/2021 09:05   US Venous Img Lower Unilateral Left  Result Date: 05/09/2021 CLINICAL DATA:  Left lower extremity pain and edema. History of previous DVT. Evaluate for acute or chronic DVT. EXAM: LEFT LOWER EXTREMITY VENOUS DOPPLER ULTRASOUND TECHNIQUE: Gray-scale sonography with graded compression, as well as color Doppler and duplex ultrasound were performed to evaluate the lower extremity deep venous systems from the level of the common femoral vein and including the common femoral, femoral, profunda femoral, popliteal and calf veins including the posterior tibial, peroneal and gastrocnemius veins when visible. The superficial great saphenous vein was also interrogated. Spectral Doppler was utilized to evaluate flow at rest and with distal augmentation maneuvers in the common femoral, femoral and popliteal veins. COMPARISON:  Left lower extremity venous Doppler ultrasound-08/14/2013 (positive for mixed occlusive and nonocclusive DVT involving the left common femoral, femoral and popliteal veins) FINDINGS: Contralateral Common Femoral Vein: Respiratory phasicity is normal and symmetric with the symptomatic side. No evidence of thrombus. Normal compressibility. Common Femoral Vein: No  evidence of acute or chronic thrombus. Normal compressibility, respiratory phasicity and response to augmentation. Saphenofemoral Junction: No evidence of thrombus. Normal compressibility and flow on color Doppler imaging. Profunda Femoral Vein: No evidence of thrombus. Normal compressibility and flow on color Doppler imaging. Femoral Vein: No evidence of acute or chronic thrombus. Normal compressibility, respiratory  phasicity and response to augmentation. Popliteal Vein: There is mixed echogenic near occlusive thrombus involving the distal aspect of the left popliteal vein (images 26, 28 and 31) Calf Veins: There is hypoechoic occlusive thrombus involving one of the paired left posterior tibial veins (image 35). The peroneal veins appear patent where imaged. Superficial Great Saphenous Vein: No evidence of thrombus. Normal compressibility. Other Findings:  None. IMPRESSION: The examination is positive for near occlusive DVT involving the distal aspect of the left popliteal vein as well as occlusive thrombus involving one of the paired left posterior tibial veins - while potentially chronic in etiology, in the absence of more recent prior comparison, an acute on chronic process is not excluded. Clinical correlation is advised. Electronically Signed   By: Sandi Mariscal M.D.   On: 05/09/2021 15:26    Labs Recent Results (from the past 2160 hour(s))  Comprehensive Metabolic Panel (CMET)     Status: Abnormal   Collection Time: 03/19/21  8:55 AM  Result Value Ref Range   Glucose, Bld 92 65 - 99 mg/dL    Comment: .            Fasting reference interval .    BUN 36 (H) 7 - 25 mg/dL   Creat 1.68 (H) 0.70 - 1.33 mg/dL    Comment: For patients >39 years of age, the reference limit for Creatinine is approximately 13% higher for people identified as African-American. .    BUN/Creatinine Ratio 21 6 - 22 (calc)   Sodium 139 135 - 146 mmol/L   Potassium 4.3 3.5 - 5.3 mmol/L   Chloride 104 98 - 110 mmol/L   CO2  25 20 - 32 mmol/L   Calcium 9.2 8.6 - 10.3 mg/dL   Total Protein 6.7 6.1 - 8.1 g/dL   Albumin 3.7 3.6 - 5.1 g/dL   Globulin 3.0 1.9 - 3.7 g/dL (calc)   AG Ratio 1.2 1.0 - 2.5 (calc)   Total Bilirubin 0.7 0.2 - 1.2 mg/dL   Alkaline phosphatase (APISO) 84 35 - 144 U/L   AST 15 10 - 35 U/L   ALT 16 9 - 46 U/L  CBC with Differential     Status: None   Collection Time: 03/19/21  8:55 AM  Result Value Ref Range   WBC 5.1 3.8 - 10.8 Thousand/uL   RBC 5.27 4.20 - 5.80 Million/uL   Hemoglobin 15.4 13.2 - 17.1 g/dL   HCT 46.7 38.5 - 50.0 %   MCV 88.6 80.0 - 100.0 fL   MCH 29.2 27.0 - 33.0 pg   MCHC 33.0 32.0 - 36.0 g/dL   RDW 14.0 11.0 - 15.0 %   Platelets 176 140 - 400 Thousand/uL   MPV 11.1 7.5 - 12.5 fL   Neutro Abs 3,361 1,500 - 7,800 cells/uL   Lymphs Abs 882 850 - 3,900 cells/uL   Absolute Monocytes 745 200 - 950 cells/uL   Eosinophils Absolute 92 15 - 500 cells/uL   Basophils Absolute 20 0 - 200 cells/uL   Neutrophils Relative % 65.9 %   Total Lymphocyte 17.3 %   Monocytes Relative 14.6 %   Eosinophils Relative 1.8 %   Basophils Relative 0.4 %  HgB A1c     Status: Abnormal   Collection Time: 03/19/21  8:55 AM  Result Value Ref Range   Hgb A1c MFr Bld 5.8 (H) <5.7 % of total Hgb    Comment: For someone without known diabetes, a hemoglobin  A1c value between 5.7% and 6.4%  is consistent with prediabetes and should be confirmed with a  follow-up test. . For someone with known diabetes, a value <7% indicates that their diabetes is well controlled. A1c targets should be individualized based on duration of diabetes, age, comorbid conditions, and other considerations. . This assay result is consistent with an increased risk of diabetes. . Currently, no consensus exists regarding use of hemoglobin A1c for diagnosis of diabetes for children. .    Mean Plasma Glucose 120 mg/dL   eAG (mmol/L) 6.6 mmol/L  Urinalysis, Routine w reflex microscopic     Status: Abnormal    Collection Time: 03/19/21  8:55 AM  Result Value Ref Range   Color, Urine YELLOW YELLOW   APPearance CLEAR CLEAR   Specific Gravity, Urine 1.014 1.001 - 1.03   pH < OR = 5.0 5.0 - 8.0   Glucose, UA NEGATIVE NEGATIVE   Bilirubin Urine NEGATIVE NEGATIVE   Ketones, ur NEGATIVE NEGATIVE   Hgb urine dipstick NEGATIVE NEGATIVE   Protein, ur 1+ (A) NEGATIVE   Nitrite NEGATIVE NEGATIVE   Leukocytes,Ua NEGATIVE NEGATIVE   WBC, UA 0-5 0 - 5 /HPF   RBC / HPF NONE SEEN 0 - 2 /HPF   Squamous Epithelial / LPF NONE SEEN < OR = 5 /HPF   Bacteria, UA NONE SEEN NONE SEEN /HPF   Hyaline Cast NONE SEEN NONE SEEN /LPF  MICROSCOPIC MESSAGE     Status: None   Collection Time: 03/19/21  8:55 AM  Result Value Ref Range   Note      Comment: This urine was analyzed for the presence of WBC,  RBC, bacteria, casts, and other formed elements.  Only those elements seen were reported. . .     Assessment/Plan:  Hypertension blood pressure control important in reducing the progression of atherosclerotic disease. On appropriate oral medications.   Mixed hyperlipidemia lipid control important in reducing the progression of atherosclerotic disease. Continue statin therapy   Prediabetes blood glucose control important in reducing the progression of atherosclerotic disease. Also, involved in wound healing.    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  He has more prominent and painful varicosities.  I think at this point, a reflux study of the left leg would be prudent to further evaluate the anatomy and determine further treatment options.  Continue his compression socks, elevation, and activity as tolerated.      Leotis Pain 06/04/2021, 4:16 PM   This note was created with Dragon medical transcription system.   Any errors from dictation are unintentional.

## 2021-06-14 DIAGNOSIS — I10 Essential (primary) hypertension: Secondary | ICD-10-CM | POA: Insufficient documentation

## 2021-06-19 ENCOUNTER — Other Ambulatory Visit: Payer: Self-pay

## 2021-06-19 ENCOUNTER — Telehealth: Payer: Self-pay

## 2021-06-19 DIAGNOSIS — N1831 Chronic kidney disease, stage 3a: Secondary | ICD-10-CM

## 2021-06-19 DIAGNOSIS — M3214 Glomerular disease in systemic lupus erythematosus: Secondary | ICD-10-CM

## 2021-06-19 NOTE — Telephone Encounter (Signed)
Copied from [place] [phone]. Topic: General - Other >> Jun 19, 2021  2:50 PM Celene Kras wrote: Reason for CRM: Jonathon Snow, from [place], calling stating that she is needing to have someone give her a call regarding getting [employer] updated for the pt so that they are able to see him. Referral in system, but it expired in January. Please advise.

## 2021-06-26 ENCOUNTER — Ambulatory Visit (INDEPENDENT_AMBULATORY_CARE_PROVIDER_SITE_OTHER): Payer: 59 | Admitting: Nurse Practitioner

## 2021-06-26 ENCOUNTER — Encounter (INDEPENDENT_AMBULATORY_CARE_PROVIDER_SITE_OTHER): Payer: 59

## 2021-07-11 ENCOUNTER — Encounter (INDEPENDENT_AMBULATORY_CARE_PROVIDER_SITE_OTHER): Payer: 59

## 2021-07-11 ENCOUNTER — Ambulatory Visit (INDEPENDENT_AMBULATORY_CARE_PROVIDER_SITE_OTHER): Payer: 59 | Admitting: Nurse Practitioner

## 2021-07-15 ENCOUNTER — Ambulatory Visit: Payer: Self-pay | Admitting: *Deleted

## 2021-07-15 NOTE — Telephone Encounter (Signed)
Reason for Disposition  MODERATE-SEVERE rectal pain (i.e., interferes with school, work, or sleep)  Answer Assessment - Initial Assessment Questions 1. SYMPTOM:  "What's the main symptom you're concerned about?" (e.g., pain, itching, swelling, rash)     Painful rectal abscess 2. ONSET: "When did the  start?"     Last week 3. RECTAL PAIN: "Do you have any pain around your rectum?" "How bad is the pain?"  (Scale 0-10; or mild, moderate, severe)   - NONE (0): no pain   - MILD (1-3): doesn't interfere with normal activities    - MODERATE (4-7): interferes with normal activities or awakens from sleep, limping    A lot to sit/walk 4. RECTAL ITCHING: "Do you have any itching in this area?" "How bad is the itching?"  (Scale 0-10; or mild, moderate, severe)   - NONE: no itching   - MILD: doesn't interfere with normal activities    - MODERATE-SEVERE: interferes with normal activities or awakens from sleep     none 5. CONSTIPATION: "Do you have constipation?" If Yes, ask: "How bad is it?"     occasionally 6. CAUSE: "What do you think is causing the anus symptoms?"     Abscess. Has had one previously 7. OTHER SYMPTOMS: "Do you have any other symptoms?"  (e.g., abdomen pain, fever, rectal bleeding, vomiting)     none 8. PREGNANCY: "Is there any chance you are pregnant?" "When was your last menstrual period?"     na  Protocols used: Rectal Symptoms-A-AH

## 2021-07-16 ENCOUNTER — Other Ambulatory Visit: Payer: Self-pay

## 2021-07-16 ENCOUNTER — Encounter: Payer: Self-pay | Admitting: Unknown Physician Specialty

## 2021-07-16 ENCOUNTER — Ambulatory Visit: Payer: Self-pay | Admitting: Unknown Physician Specialty

## 2021-07-16 ENCOUNTER — Ambulatory Visit (INDEPENDENT_AMBULATORY_CARE_PROVIDER_SITE_OTHER): Payer: 59 | Admitting: Unknown Physician Specialty

## 2021-07-16 VITALS — BP 148/78 | HR 96 | Temp 98.0°F | Resp 18 | Ht 74.0 in | Wt 237.0 lb

## 2021-07-16 DIAGNOSIS — R350 Frequency of micturition: Secondary | ICD-10-CM

## 2021-07-16 NOTE — Progress Notes (Signed)
BP (!) 148/78   Pulse 96   Temp 98 F (36.7 C)   Resp 18   Ht '6\' 2"'$  (1.88 m)   Wt 237 lb (107.5 kg)   SpO2 99%   BMI 30.43 kg/m    Subjective:    Patient ID: Jonathon Snow, male    DOB: [birth year], 60 y.o.   MRN: [id number]  HPI: FERAS MUSTAPHA is a 60 y.o. male  Chief Complaint  Patient presents with   Urinary Frequency    Had surgery about 1 month ago   Numbness    In tongue   Urinary frequency- Pt is here for complaints of urinary frequency.  Exam and history are limited as pt expressed a great deal of pain - Had a lumbar fusion last month.  Pain is similar to what he had prior to surgery with radiculopathy.  Pt is able to ambulate with a walker but states he has been able to get by without it.  Called surgeon and they suggested checking in with [place] first.  Urinating every 2 hours  Relevant past medical, surgical, family and social history reviewed and updated as indicated. Interim medical history since our last visit reviewed. Allergies and medications reviewed and updated.  Review of Systems  HENT:         Numbness roof of mouth, worse in the AM   Per HPI unless specifically indicated above     Objective:    BP (!) 148/78   Pulse 96   Temp 98 F (36.7 C)   Resp 18   Ht '6\' 2"'$  (1.88 m)   Wt 237 lb (107.5 kg)   SpO2 99%   BMI 30.43 kg/m   Wt Readings from Last 3 Encounters:  07/16/21 237 lb (107.5 kg)  06/04/21 237 lb 6.4 oz (107.7 kg)  05/27/21 250 lb (113.4 kg)    Physical Exam Constitutional:      Appearance: He is overweight.  Cardiovascular:     Rate and Rhythm: Normal rate and regular rhythm.  Pulmonary:     Effort: Pulmonary effort is normal.  Skin:    Coloration: Skin is pale.     Findings: No rash.  Neurological:     Mental Status: He is alert.  Psychiatric:        Behavior: Behavior is cooperative.    Results for orders placed or performed in visit on 03/19/21  MICROSCOPIC MESSAGE  Result Value Ref Range   Note    Comprehensive  Metabolic Panel (CMET)  Result Value Ref Range   Glucose, Bld 92 65 - 99 mg/dL   BUN 36 (H) 7 - 25 mg/dL   Creat 1.68 (H) 0.70 - 1.33 mg/dL   BUN/Creatinine Ratio 21 6 - 22 (calc)   Sodium 139 135 - 146 mmol/L   Potassium 4.3 3.5 - 5.3 mmol/L   Chloride 104 98 - 110 mmol/L   CO2 25 20 - 32 mmol/L   Calcium 9.2 8.6 - 10.3 mg/dL   Total Protein 6.7 6.1 - 8.1 g/dL   Albumin 3.7 3.6 - 5.1 g/dL   Globulin 3.0 1.9 - 3.7 g/dL (calc)   AG Ratio 1.2 1.0 - 2.5 (calc)   Total Bilirubin 0.7 0.2 - 1.2 mg/dL   Alkaline phosphatase (APISO) 84 35 - 144 U/L   AST 15 10 - 35 U/L   ALT 16 9 - 46 U/L  CBC with Differential  Result Value Ref Range   WBC 5.1 3.8 - 10.8  Thousand/uL   RBC 5.27 4.20 - 5.80 Million/uL   Hemoglobin 15.4 13.2 - 17.1 g/dL   HCT 46.7 38.5 - 50.0 %   MCV 88.6 80.0 - 100.0 fL   MCH 29.2 27.0 - 33.0 pg   MCHC 33.0 32.0 - 36.0 g/dL   RDW 14.0 11.0 - 15.0 %   Platelets 176 140 - 400 Thousand/uL   MPV 11.1 7.5 - 12.5 fL   Neutro Abs 3,361 1,500 - 7,800 cells/uL   Lymphs Abs 882 850 - 3,900 cells/uL   Absolute Monocytes 745 200 - 950 cells/uL   Eosinophils Absolute 92 15 - 500 cells/uL   Basophils Absolute 20 0 - 200 cells/uL   Neutrophils Relative % 65.9 %   Total Lymphocyte 17.3 %   Monocytes Relative 14.6 %   Eosinophils Relative 1.8 %   Basophils Relative 0.4 %  HgB A1c  Result Value Ref Range   Hgb A1c MFr Bld 5.8 (H) <5.7 % of total Hgb   Mean Plasma Glucose 120 mg/dL   eAG (mmol/L) 6.6 mmol/L  Urinalysis, Routine w reflex microscopic  Result Value Ref Range   Color, Urine YELLOW YELLOW   APPearance CLEAR CLEAR   Specific Gravity, Urine 1.014 1.001 - 1.03   pH < OR = 5.0 5.0 - 8.0   Glucose, UA NEGATIVE NEGATIVE   Bilirubin Urine NEGATIVE NEGATIVE   Ketones, ur NEGATIVE NEGATIVE   Hgb urine dipstick NEGATIVE NEGATIVE   Protein, ur 1+ (A) NEGATIVE   Nitrite NEGATIVE NEGATIVE   Leukocytes,Ua NEGATIVE NEGATIVE   WBC, UA 0-5 0 - 5 /HPF   RBC / HPF NONE SEEN  0 - 2 /HPF   Squamous Epithelial / LPF NONE SEEN < OR = 5 /HPF   Bacteria, UA NONE SEEN NONE SEEN /HPF   Hyaline Cast NONE SEEN NONE SEEN /LPF      Assessment & Plan:   Problem List Items Addressed This Visit   None Visit Diagnoses     Urinary frequency    -  Primary   Pt unable to give urine specimen. In a great deal of pain here and unable to get comfortable.  Will have pt bring in urine specimen for evidence of UTI or gluco       Referred back to surgery for radicular pain  Follow up plan: Return if symptoms worsen or fail to improve.

## 2021-07-22 ENCOUNTER — Telehealth: Payer: Self-pay

## 2021-07-22 NOTE — Telephone Encounter (Signed)
Pt called back states he will bring urine to check for infection

## 2021-07-22 NOTE — Telephone Encounter (Signed)
Pt called asking about the referral to urologist. Pt states " he just cant bring a urine in". Please advise

## 2021-07-23 ENCOUNTER — Telehealth (INDEPENDENT_AMBULATORY_CARE_PROVIDER_SITE_OTHER): Payer: 59

## 2021-07-23 ENCOUNTER — Encounter: Payer: Self-pay | Admitting: Unknown Physician Specialty

## 2021-07-23 DIAGNOSIS — R350 Frequency of micturition: Secondary | ICD-10-CM

## 2021-07-23 LAB — POCT URINALYSIS DIPSTICK
Bilirubin, UA: NEGATIVE
Blood, UA: NEGATIVE
Glucose, UA: NEGATIVE
Ketones, UA: NEGATIVE
Leukocytes, UA: NEGATIVE
Nitrite, UA: NEGATIVE
Odor: NORMAL
Protein, UA: NEGATIVE
Spec Grav, UA: 1.015 (ref 1.010–1.025)
Urobilinogen, UA: 0.2 E.U./dL
pH, UA: 6 (ref 5.0–8.0)

## 2021-07-23 NOTE — Telephone Encounter (Signed)
What exactly did you want with his urine? Just a dip?

## 2021-07-24 LAB — URINE CULTURE
MICRO NUMBER:: 12192236
Result:: NO GROWTH
SPECIMEN QUALITY:: ADEQUATE

## 2021-08-01 ENCOUNTER — Encounter (INDEPENDENT_AMBULATORY_CARE_PROVIDER_SITE_OTHER): Payer: 59

## 2021-08-01 ENCOUNTER — Ambulatory Visit (INDEPENDENT_AMBULATORY_CARE_PROVIDER_SITE_OTHER): Payer: 59 | Admitting: Nurse Practitioner

## 2021-08-01 ENCOUNTER — Telehealth: Payer: Self-pay

## 2021-08-01 NOTE — Telephone Encounter (Signed)
Lvm for pt to call and schedule an appt or to get OTC cream per nurse  Copied from [place] [id number]. Topic: General - Inquiry >> Aug 01, 2021  2:07 PM Greggory Keen D wrote: Reason for CRM: Pt called saying the rash in his groin area has came back again.[name] He is asking if Kristeen Miss can give him the medication he has had before to help it.  [place]  CB#  [phone]

## 2021-08-01 NOTE — Telephone Encounter (Signed)
She will not give anything without an appt.  Please schedule or if a fungus, can try OTC cream

## 2021-08-02 NOTE — Telephone Encounter (Signed)
Patient scheduled with Drema Balzarine., NP (approved my the office to schedule ) for 08/08/2021, any cancellations please reach out. Patient placed on the waitlist

## 2021-08-05 ENCOUNTER — Telehealth: Payer: Self-pay | Admitting: Family Medicine

## 2021-08-05 NOTE — Telephone Encounter (Signed)
Referral Request - Has patient seen PCP for this complaint?n *If NO, is insurance requiring patient see PCP for this issue before PCP can refer them?n Referral for which specialty: skin Dr Preferred provider/office:[facility] Reason for referral: plantar on foot

## 2021-08-06 NOTE — Telephone Encounter (Signed)
Has he discussed this appointment with you

## 2021-08-08 ENCOUNTER — Ambulatory Visit (INDEPENDENT_AMBULATORY_CARE_PROVIDER_SITE_OTHER): Payer: 59 | Admitting: Nurse Practitioner

## 2021-08-08 ENCOUNTER — Encounter (INDEPENDENT_AMBULATORY_CARE_PROVIDER_SITE_OTHER): Payer: Self-pay | Admitting: Nurse Practitioner

## 2021-08-08 ENCOUNTER — Encounter: Payer: Self-pay | Admitting: Unknown Physician Specialty

## 2021-08-08 ENCOUNTER — Other Ambulatory Visit: Payer: Self-pay

## 2021-08-08 ENCOUNTER — Ambulatory Visit: Payer: 59 | Admitting: Unknown Physician Specialty

## 2021-08-08 ENCOUNTER — Ambulatory Visit (INDEPENDENT_AMBULATORY_CARE_PROVIDER_SITE_OTHER): Payer: 59

## 2021-08-08 VITALS — BP 142/85 | HR 80 | Resp 21 | Ht 75.0 in | Wt 239.0 lb

## 2021-08-08 VITALS — BP 132/84 | HR 94 | Temp 97.5°F | Resp 18 | Ht 74.0 in | Wt 235.3 lb

## 2021-08-08 DIAGNOSIS — I825Y2 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: Secondary | ICD-10-CM | POA: Diagnosis not present

## 2021-08-08 DIAGNOSIS — Z23 Encounter for immunization: Secondary | ICD-10-CM | POA: Diagnosis not present

## 2021-08-08 DIAGNOSIS — I10 Essential (primary) hypertension: Secondary | ICD-10-CM

## 2021-08-08 DIAGNOSIS — B356 Tinea cruris: Secondary | ICD-10-CM

## 2021-08-08 DIAGNOSIS — I83813 Varicose veins of bilateral lower extremities with pain: Secondary | ICD-10-CM | POA: Diagnosis not present

## 2021-08-08 DIAGNOSIS — Z86718 Personal history of other venous thrombosis and embolism: Secondary | ICD-10-CM

## 2021-08-08 DIAGNOSIS — B070 Plantar wart: Secondary | ICD-10-CM

## 2021-08-08 MED ORDER — NYSTATIN 100000 UNIT/GM EX POWD: 1.0000 "application " | Freq: Three times a day (TID) | CUTANEOUS | 0 refills | Status: DC

## 2021-08-08 MED ORDER — NYSTATIN-TRIAMCINOLONE 100000-0.1 UNIT/GM-% EX OINT
1.0000 | TOPICAL_OINTMENT | Freq: Two times a day (BID) | CUTANEOUS | 0 refills | Status: DC
Start: 2021-08-08 — End: 2022-03-04

## 2021-08-08 NOTE — Progress Notes (Signed)
BP 132/84   Pulse 94   Temp (!) 97.5 F (36.4 C) (Oral)   Resp 18   Ht '6\' 2"'$  (1.88 m)   Wt 235 lb 4.8 oz (106.7 kg)   SpO2 98%   BMI 30.21 kg/m    Subjective:    Patient ID: Jonathon Snow, male    DOB: 08/10/1961, 60 y.o.   MRN: [id number]  HPI: Jonathon Snow is a 60 y.o. male  Chief Complaint  Patient presents with   Rash    Groin area   Referral    For wart on right foot. [place]   Rash- Pt with groin rash mostly on left and almost gone using the cream he was given previously (chart review shows triamcinolone)./  Since this recurs,he would like to take something to keep it from coming back.  Last Hgb A1C in March was 5.8.  Pt is currently mostly sedentarry secondary to recent back surgery and chronic back  Plantar wart on right foot would like to go to dermatology for the most efficient method of removing as it is painful.    Relevant past medical, surgical, family and social history reviewed and updated as indicated. Interim medical history since our last visit reviewed. Allergies and medications reviewed and updated.  Review of Systems  Per HPI unless specifically indicated above     Objective:    BP 132/84   Pulse 94   Temp (!) 97.5 F (36.4 C) (Oral)   Resp 18   Ht '6\' 2"'$  (1.88 m)   Wt 235 lb 4.8 oz (106.7 kg)   SpO2 98%   BMI 30.21 kg/m   Wt Readings from Last 3 Encounters:  08/08/21 235 lb 4.8 oz (106.7 kg)  07/16/21 237 lb (107.5 kg)  06/04/21 237 lb 6.4 oz (107.7 kg)    Physical Exam Constitutional:      General: He is not in acute distress.    Appearance: Normal appearance. He is well-developed.  HENT:     Head: Normocephalic and atraumatic.  Eyes:     General: Lids are normal. No scleral icterus.       Right eye: No discharge.        Left eye: No discharge.     Conjunctiva/sclera: Conjunctivae normal.  Cardiovascular:     Rate and Rhythm: Normal rate.  Pulmonary:     Effort: Pulmonary effort is normal.  Abdominal:      Palpations: There is no hepatomegaly or splenomegaly.  Musculoskeletal:        General: Normal range of motion.  Skin:    Coloration: Skin is not pale.     Findings: No rash.     Comments: Large plantar wart left foot below big toe.  Did not assess groin as rash "almost gone"  Neurological:     Mental Status: He is alert and oriented to person, place, and time.  Psychiatric:        Behavior: Behavior normal.        Thought Content: Thought content normal.        Judgment: Judgment normal.    Results for orders placed or performed in visit on 07/23/21  Urine Culture   Specimen: Urine  Result Value Ref Range   MICRO NUMBER: [id number]    SPECIMEN QUALITY: Adequate    Sample Source URINE    STATUS: FINAL    Result: No Growth   POCT urinalysis dipstick  Result Value Ref Range   Color,  UA yellow    Clarity, UA clear    Glucose, UA Negative Negative   Bilirubin, UA neg    Ketones, UA neg    Spec Grav, UA 1.015 1.010 - 1.025   Blood, UA neg    pH, UA 6.0 5.0 - 8.0   Protein, UA Negative Negative   Urobilinogen, UA 0.2 0.2 or 1.0 E.U./dL   Nitrite, UA neg    Leukocytes, UA Negative Negative   Appearance clear    Odor normal       Assessment & Plan:   Problem List Items Addressed This Visit   None Visit Diagnoses     Need for Tdap vaccination    -  Primary   Relevant Orders   Tdap vaccine greater than or equal to 7yo IM (Completed)   Tinea cruris       Using steroid cream which will help with sxs but not yeast.  Will rx Nystatin powder and if cakes too much will give ointment   Relevant Medications   nystatin-triamcinolone ointment (MYCOLOG)   nystatin (MYCOSTATIN/NYSTOP) powder   Plantar wart of right foot       Has had them "cut out" before.  Refer to dermatology   Relevant Medications   nystatin-triamcinolone ointment (MYCOLOG)   nystatin (MYCOSTATIN/NYSTOP) powder   Other Relevant Orders   Ambulatory referral to Dermatology        Follow up plan: Return if  symptoms worsen or fail to improve.

## 2021-08-13 ENCOUNTER — Ambulatory Visit: Payer: 59 | Attending: Internal Medicine

## 2021-08-13 ENCOUNTER — Other Ambulatory Visit: Payer: Self-pay

## 2021-08-13 DIAGNOSIS — Z23 Encounter for immunization: Secondary | ICD-10-CM

## 2021-08-13 MED ORDER — PFIZER-BIONT COVID-19 VAC-TRIS 30 MCG/0.3ML IM SUSP
INTRAMUSCULAR | 0 refills | Status: DC
  Filled 2021-08-13: qty 0.3, 1d supply, fill #0

## 2021-08-13 NOTE — Progress Notes (Signed)
   Covid-19 Vaccination Clinic  Name:  Jonathon Snow    MRN: [id number] DOB: 06-14-1961  08/13/2021  Jonathon Snow was observed post Covid-19 immunization for 15 minutes without incident. He was provided with Vaccine Information Sheet and instruction to access the V-Safe system.   Jonathon Snow was instructed to call 911 with any severe reactions post vaccine: Difficulty breathing  Swelling of face and throat  A fast heartbeat  A bad rash all over body  Dizziness and weakness   Immunizations Administered     Name Date Dose VIS Date Route   PFIZER Comrnaty(Gray TOP) Covid-19 Vaccine 08/13/2021  9:49 AM 0.3 mL 11/29/2020 Intramuscular   Manufacturer: [employer]   Lot: [id number]   NDC: [place], PharmD, MBA Clinical Acute Care Pharmacist

## 2021-08-14 ENCOUNTER — Telehealth: Payer: Self-pay | Admitting: Family Medicine

## 2021-08-14 NOTE — Telephone Encounter (Signed)
Patient checking on the status of urology referral that was suppose to be placed on 07/16/2021 appointment. Patient would like a follow up call today

## 2021-08-14 NOTE — Telephone Encounter (Signed)
Were you going to place referral for him?

## 2021-08-15 ENCOUNTER — Other Ambulatory Visit: Payer: Self-pay

## 2021-08-15 ENCOUNTER — Encounter: Payer: Self-pay | Admitting: Dermatology

## 2021-08-15 ENCOUNTER — Ambulatory Visit: Payer: 59 | Admitting: Dermatology

## 2021-08-15 ENCOUNTER — Other Ambulatory Visit: Payer: Self-pay | Admitting: Unknown Physician Specialty

## 2021-08-15 DIAGNOSIS — B079 Viral wart, unspecified: Secondary | ICD-10-CM

## 2021-08-15 DIAGNOSIS — R350 Frequency of micturition: Secondary | ICD-10-CM

## 2021-08-15 NOTE — Patient Instructions (Addendum)
If you have any questions or concerns for your doctor, please call our main line at [phone] and press option 4 to reach your doctor's medical assistant. If no one answers, please leave a voicemail as directed and we will return your call as soon as possible. Messages left after 4 pm will be answered the following business day.   You may also send us a message via MyChart. We typically respond to MyChart messages within 1-2 business days.  For prescription refills, please ask your pharmacy to contact our office. Our fax number is [phone].  If you have an urgent issue when the clinic is closed that cannot wait until the next business day, you can page your doctor at the number below.    Please note that while we do our best to be available for urgent issues outside of office hours, we are not available 24/7.   If you have an urgent issue and are unable to reach us, you may choose to seek medical care at your doctor's office, retail clinic, urgent care center, or emergency room.  If you have a medical emergency, please immediately call 911 or go to the emergency department.  Pager Numbers  - Dr. Kowalski: [phone]  - Dr. Moye: [phone]  - Dr. Stewart: [phone]  In the event of inclement weather, please call our main line at [phone] for an update on the status of any delays or closures.  Dermatology Medication Tips: Please keep the boxes that topical medications come in in order to help keep track of the instructions about where and how to use these. Pharmacies typically print the medication instructions only on the boxes and not directly on the medication tubes.   If your medication is too expensive, please contact our office at [phone] option 4 or send us a message through MyChart.   We are unable to tell what your co-pay for medications will be in advance as this is different depending on your insurance coverage. However, we may be able to find a substitute  medication at lower cost or fill out paperwork to get insurance to cover a needed medication.   If a prior authorization is required to get your medication covered by your insurance company, please allow us 1-2 business days to complete this process.  Drug prices often vary depending on where the prescription is filled and some pharmacies may offer cheaper prices.  The website www.goodrx.com contains coupons for medications through different pharmacies. The prices here do not account for what the cost may be with help from insurance (it may be cheaper with your insurance), but the website can give you the price if you did not use any insurance.  - You can print the associated coupon and take it with your prescription to the pharmacy.  - You may also stop by our office during regular business hours and pick up a GoodRx coupon card.  - If you need your prescription sent electronically to a different pharmacy, notify our office through [place] MyChart or by phone at [phone] option 4.  Instructions for [facility] Medications  One or more of your medications was sent to the [facility] mail order compounding pharmacy. You will receive an email from them and can purchase the medicine through that link. It will then be mailed to your home at the address you confirmed. If for any reason you do not receive an email from them, please check your spam folder. If you still do not find the email,   please let us know. [facility] phone number is [phone].   

## 2021-08-15 NOTE — Progress Notes (Signed)
   Follow-Up Visit   Subjective  Jonathon Snow is a 60 y.o. male who presents for the following: wart (R foot x 46m.  The following portions of the chart were reviewed this encounter and updated as appropriate:   Tobacco  Allergies  Meds  Problems  Med Hx  Surg Hx  Fam Hx     Review of Systems:  No other skin or systemic complaints except as noted in HPI or Assessment and Plan.  Objective  Well appearing patient in no apparent distress; mood and affect are within normal limits.  A focused examination was performed including right foot. Relevant physical exam findings are noted in the Assessment and Plan.  R ant medial sole x 1 Verrucous papule 0.8cm -- Discussed viral etiology and contagion.    Assessment & Plan  Viral warts, unspecified type R ant medial sole x 1  Discussed viral etiology and risk of spread.  Discussed multiple treatments may be required to clear warts.  Discussed possible post-treatment dyspigmentation and risk of recurrence.  LN2 x 1 Squaric Acid 3% x 1 Cantharone Plus x 1  Start [facility] Wart Paste qhs and cover with band aid.  In the morning wash off and apply duct tape.  Leave Duct tape on until applying the wart paste at night.  Destruction of lesion - R ant medial sole x 1 Complexity: simple   Destruction method: cryotherapy   Informed consent: discussed and consent obtained   Timeout:  patient name, date of birth, surgical site, and procedure verified Lesion destroyed using liquid nitrogen: Yes   Region frozen until ice ball extended beyond lesion: Yes   Outcome: patient tolerated procedure well with no complications   Post-procedure details: wound care instructions given    Destruction of lesion - R ant medial sole x 1  Destruction method: chemical removal   Informed consent: discussed and consent obtained   Timeout:  patient name, date of birth, surgical site, and procedure verified Chemical destruction method comment:  Squaric  Acid 3%, Cantharidin Plus Application time:  6 hours Procedure instructions: patient instructed to wash and dry area   Outcome: patient tolerated procedure well with no complications   Post-procedure details: wound care instructions given   Additional details:  Patient advised to set alarm to remind them to wash off with soap and water at the directed time.  Return in about 6 weeks (around 09/26/2021) for 6-8 wks wart f/u.  I, SOthelia Pulling RMA, am acting as scribe for DSarina Ser MD . Documentation: I have reviewed the above documentation for accuracy and completeness, and I agree with the above.  DSarina Ser MD

## 2021-08-15 NOTE — Telephone Encounter (Signed)
Patient called upset that he wasn't called after his appt a month ago to be told he has a UTI. He states he doesn't need a referral for this. Please call back

## 2021-08-15 NOTE — Telephone Encounter (Signed)
Yes, Pt notified referral entered.

## 2021-08-15 NOTE — Telephone Encounter (Signed)
Pt is calling back and would like a callback today consider UTI result he was not treated for

## 2021-08-22 ENCOUNTER — Emergency Department
Admission: EM | Admit: 2021-08-22 | Discharge: 2021-08-22 | Disposition: A | Discharge status: Home / Self Care | Payer: 59 | Attending: Emergency Medicine | Admitting: Emergency Medicine

## 2021-08-22 ENCOUNTER — Emergency Department: Payer: 59

## 2021-08-22 DIAGNOSIS — R6883 Chills (without fever): Secondary | ICD-10-CM | POA: Diagnosis not present

## 2021-08-22 DIAGNOSIS — J40 Bronchitis, not specified as acute or chronic: Secondary | ICD-10-CM

## 2021-08-22 DIAGNOSIS — Z8616 Personal history of COVID-19: Secondary | ICD-10-CM | POA: Diagnosis not present

## 2021-08-22 DIAGNOSIS — N1831 Chronic kidney disease, stage 3a: Secondary | ICD-10-CM | POA: Insufficient documentation

## 2021-08-22 DIAGNOSIS — I129 Hypertensive chronic kidney disease with stage 1 through stage 4 chronic kidney disease, or unspecified chronic kidney disease: Secondary | ICD-10-CM | POA: Insufficient documentation

## 2021-08-22 DIAGNOSIS — Z20822 Contact with and (suspected) exposure to covid-19: Secondary | ICD-10-CM | POA: Diagnosis not present

## 2021-08-22 DIAGNOSIS — Z79899 Other long term (current) drug therapy: Secondary | ICD-10-CM | POA: Diagnosis not present

## 2021-08-22 DIAGNOSIS — R0602 Shortness of breath: Secondary | ICD-10-CM | POA: Insufficient documentation

## 2021-08-22 DIAGNOSIS — Z7901 Long term (current) use of anticoagulants: Secondary | ICD-10-CM | POA: Insufficient documentation

## 2021-08-22 LAB — COMPREHENSIVE METABOLIC PANEL
ALT: 17 U/L (ref 0–44)
AST: 19 U/L (ref 15–41)
Albumin: 3.7 g/dL (ref 3.5–5.0)
Alkaline Phosphatase: 90 U/L (ref 38–126)
Anion gap: 10 (ref 5–15)
BUN: 25 mg/dL — ABNORMAL HIGH (ref 6–20)
CO2: 21 mmol/L — ABNORMAL LOW (ref 22–32)
Calcium: 9.5 mg/dL (ref 8.9–10.3)
Chloride: 107 mmol/L (ref 98–111)
Creatinine, Ser: 1.32 mg/dL — ABNORMAL HIGH (ref 0.61–1.24)
GFR, Estimated: >60 mL/min (ref >60)
Glucose, Bld: 106 mg/dL — ABNORMAL HIGH (ref 70–99)
Potassium: 3.8 mmol/L (ref 3.5–5.1)
Sodium: 138 mmol/L (ref 135–145)
Total Bilirubin: 1.1 mg/dL (ref 0.3–1.2)
Total Protein: 7.4 g/dL (ref 6.5–8.1)

## 2021-08-22 LAB — CBC
HCT: 39.9 % (ref 39.0–52.0)
Hemoglobin: 13.1 g/dL (ref 13.0–17.0)
MCH: 28.4 pg (ref 26.0–34.0)
MCHC: 32.8 g/dL (ref 30.0–36.0)
MCV: 86.6 fL (ref 80.0–100.0)
Platelets: 271 10*3/uL (ref 150–400)
RBC: 4.61 MIL/uL (ref 4.22–5.81)
RDW: 15.2 % (ref 11.5–15.5)
WBC: 7.1 10*3/uL (ref 4.0–10.5)
nRBC: 0 % (ref 0.0–0.2)

## 2021-08-22 LAB — LACTIC ACID, PLASMA: Lactic Acid, Venous: 1.3 mmol/L (ref 0.5–1.9)

## 2021-08-22 LAB — RESP PANEL BY RT-PCR (FLU A&B, COVID) ARPGX2
Influenza A by PCR: NEGATIVE
Influenza B by PCR: NEGATIVE
SARS Coronavirus 2 by RT PCR: NEGATIVE

## 2021-08-22 LAB — PROCALCITONIN: Procalcitonin: <0.1 ng/mL

## 2021-08-22 LAB — TROPONIN I (HIGH SENSITIVITY): Troponin I (High Sensitivity): 8 ng/L (ref <18)

## 2021-08-22 MED ORDER — ONDANSETRON 4 MG PO TBDP
4.0000 mg | ORAL_TABLET | Freq: Once | ORAL | Status: AC
  Administered 2021-08-22: 4 mg via ORAL
  Filled 2021-08-22: qty 1

## 2021-08-22 MED ORDER — HYDROCODONE-ACETAMINOPHEN 5-325 MG PO TABS
1.0000 | ORAL_TABLET | Freq: Once | ORAL | Status: DC
Start: 2021-08-22 — End: 2021-08-22
  Filled 2021-08-22: qty 1

## 2021-08-22 MED ORDER — IOHEXOL 350 MG/ML SOLN
100.0000 mL | Freq: Once | INTRAVENOUS | Status: AC | PRN
  Administered 2021-08-22: 100 mL via INTRAVENOUS

## 2021-08-22 MED ORDER — ONDANSETRON HCL 4 MG PO TABS: 4.0000 mg | ORAL_TABLET | Freq: Three times a day (TID) | ORAL | 0 refills | Status: DC | PRN

## 2021-08-22 MED ORDER — BENZONATATE 100 MG PO CAPS: 100.0000 mg | ORAL_CAPSULE | Freq: Two times a day (BID) | ORAL | 0 refills | Status: AC | PRN

## 2021-08-22 MED ORDER — BENZONATATE 100 MG PO CAPS: 100.0000 mg | ORAL_CAPSULE | Freq: Two times a day (BID) | ORAL | 0 refills | Status: DC | PRN

## 2021-08-22 NOTE — ED Triage Notes (Signed)
Per EMS, Pt, from home, c/o SOB and difficulty swallowing starting this morning.  EMS was initially called out for an allergic reaction.  No hives or redness noted.  Pt able to speak in full sentences.  Hx of PEs and recent back surgery.

## 2021-08-22 NOTE — ED Provider Notes (Signed)
[facility] Emergency Department Provider Note   ____________________________________________    I have reviewed the triage vital signs and the nursing notes.   HISTORY  Chief Complaint Shortness of Breath     HPI Jonathon Snow is a 60 y.o. male with past medical history as detailed below on Eliquis who presents with complaints of shortness of breath.  Patient denies a history of shortness of breath in the past.  He reports at 645 he woke up with shortness of breath as well as a sensation of difficulty swallowing.  He is also had chills and is not sure as if he had a fever.  Does not report any chest pain at this time.  No rash or evidence of allergic reaction.  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   Overview:  superior sagittal sinus, left transverse sinus and cortical veins    COVID-19 virus infection 12/2020   Depression    DVT (deep venous thrombosis) (HCC)    GERD (gastroesophageal reflux disease)    Heel spur, left 02/16/2019   Heel spur, right 02/16/2019   Hyperlipidemia    Hypertension    Lupus ([place])    Lymphedema 10/07/2018   Morbid obesity ([place])    Opiate abuse, episodic ([place]) 02/26/2018   Osteoporosis    Postphlebitic syndrome with ulcer, 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/18/2016   Presence of IVC filter 03/22/2020   Pulmonary embolism ([place])    Renal disorder    Stage III    Patient Active Problem List   Diagnosis Date Noted   Hypertension 06/14/2021   Morbid obesity ([place])    COVID-19 virus infection 12/2020   Prediabetes 11/08/2020   Stage 3a chronic kidney disease ([place]) 11/08/2020   Seasonal allergies 11/08/2020   Rhinosinusitis 11/08/2020   Long term current use of systemic steroids 11/08/2020   Anticoagulant disorder ([place]) 11/07/2020   Senile purpura ([place]) 11/07/2020   History of DVT (deep vein thrombosis) 03/22/2020   Herpes  zoster infection of lumbar region 02/20/2020   Severe episode of recurrent major depressive disorder, without psychotic features ([place]) 09/12/2019   Other spondylosis with radiculopathy, lumbar region 02/16/2019   Osteoporosis 10/12/2018   Chronic venous insufficiency 10/07/2018   SLE glomerulonephritis syndrome, 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[place]) 03/03/2018   Syncope 12/29/2017   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 ([place]) 10/29/2016   Mixed hyperlipidemia 01/15/2016   Benign essential hypertension 01/15/2016   Obesity (BMI 30.0-34.9) 01/15/2016   Long term current use of anticoagulant 10/04/2015   Systemic lupus erythematosus ([place]) 05/30/2015   Pulmonary embolism ([place]) 05/30/2015   COPD, moderate ([place]) 06/27/2014   History of pulmonary embolism 12/11/2013   Nodule of right lung 12/11/2013   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   Cystic disease of liver 08/21/2013    Past Surgical History:  Procedure Laterality Date   ANKLE SURGERY Right    COLONOSCOPY WITH PROPOFOL N/A 05/28/2020   Procedure: COLONOSCOPY WITH PROPOFOL;  Surgeon: Jonathon Bellows, MD;  Location: [facility] ENDOSCOPY;  Service: Endoscopy;  Laterality: N/A;   CYST EXCISION  92 or 93    Liver cyst removal [facility]   I & D EXTREMITY Right 04/29/2017   Procedure: IRRIGATION AND DEBRIDEMENT EXTREMITY;  Surgeon: Clayburn Pert, MD;  Location: [facility] ORS;  Service: General;  Laterality: Right;   IRRIGATION AND DEBRIDEMENT ABSCESS Left 04/29/2017  Procedure: IRRIGATION AND DEBRIDEMENT Scrotal ABSCESS;  Surgeon: Clayburn Pert, MD;  Location: [facility] ORS;  Service: General;  Laterality: Left;    Prior to Admission medications   Medication Sig Start Date End Date Taking? Authorizing Provider  albuterol (VENTOLIN HFA) 108 (90 Base) MCG/ACT inhaler Inhale 2 puffs into the lungs every 6 (six) hours as needed for wheezing or shortness of breath. 01/17/20   Delsa Grana, PA-C  amLODipine (NORVASC) 5 MG tablet  Take 1 tablet (5 mg total) by mouth daily. 11/07/20   Delsa Grana, PA-C  apixaban (ELIQUIS) 5 MG TABS tablet Take 1 tablet (5 mg total) by mouth 2 (two) times daily. 01/17/20   Delsa Grana, PA-C  benzonatate (TESSALON PERLES) 100 MG capsule Take 2 capsules (200 mg total) by mouth 3 (three) times daily as needed. Patient not taking: Reported on 08/08/2021 05/27/21 05/27/22  Sable Feil, PA-C  chlorpheniramine-HYDROcodone [facility] ER) 10-8 MG/5ML SUER Take 5 mLs by mouth at bedtime as needed for cough. 05/27/21   Sable Feil, PA-C  COVID-19 mRNA Vac-TriS, Pfizer, (PFIZER-BIONT COVID-19 VAC-TRIS) SUSP injection Inject into the muscle. 08/13/21   Carlyle Basques, MD  COVID-19 mRNA vaccine, [employer], 30 MCG/0.3ML injection USE AS DIRECTED 11/16/20 11/16/21  Carlyle Basques, MD  doxycycline (MONODOX) 100 MG capsule Take 1 capsule (100 mg total) by mouth 2 (two) times daily. Patient not taking: Reported on 08/08/2021 05/27/21   Sable Feil, PA-C  gabapentin (NEURONTIN) 100 MG capsule Take 100 mg by mouth 2 (two) times daily.    [provider]  gabapentin (NEURONTIN) 300 MG capsule Take by mouth.    [provider]  HYDROcodone-acetaminophen [facility]) 10-325 MG tablet  06/18/21   [provider]  ipratropium-albuterol (DUONEB) 0.5-2.5 (3) MG/3ML SOLN Take 3 mLs by nebulization every 4 (four) hours as needed. 01/17/20   Delsa Grana, PA-C  loratadine (CLARITIN) 10 MG tablet Take 1 tablet (10 mg total) by mouth daily. Patient not taking: Reported on 08/08/2021 11/07/20   Delsa Grana, PA-C  losartan (COZAAR) 100 MG tablet Take 1 tablet (100 mg total) by mouth daily. 11/07/20   Delsa Grana, PA-C  Mepolizumab (NUCALA) 100 MG/ML SOAJ Inject into the skin. 05/02/20   [provider]  montelukast (SINGULAIR) 10 MG tablet Take 1 tablet (10 mg total) by mouth at bedtime. 11/07/20   Delsa Grana, PA-C  Multiple Vitamins-Minerals (MULTIVITAMIN WITH MINERALS) tablet Take 1  tablet by mouth daily.    [provider]  mycophenolate (CELLCEPT) 500 MG tablet Take 1,000 mg by mouth 2 (two) times daily.    [provider]  nystatin (MYCOSTATIN/NYSTOP) powder Apply 1 application topically 3 (three) times daily. 08/08/21   Kathrine Haddock, NP  nystatin-triamcinolone ointment [facility]) Apply 1 application topically 2 (two) times daily. 08/08/21   Kathrine Haddock, NP  predniSONE (DELTASONE) 10 MG tablet Take 10 mg by mouth daily with breakfast.    Dagoberto Ligas, MD  rosuvastatin (CRESTOR) 20 MG tablet Take one pill every other night before bed. 03/19/21   Trinna Post, PA-C  traMADol (ULTRAM) 50 MG tablet Take by mouth every 6 (six) hours as needed. Patient not taking: Reported on 08/08/2021    [provider]  triamcinolone ointment (KENALOG) 0.1 % Apply 1 application topically 2 (two) times daily as needed. To affected rash/patch areas to leg for up to 1 week at a time Patient not taking: Reported on 08/08/2021 01/17/20   Delsa Grana, PA-C     Allergies Enalapril and  Vicodin [hydrocodone-acetaminophen]  Family History  Problem Relation Age of Onset   Hypertension Father    Heart disease Father    Clotting disorder Mother    Kidney disease Brother    Heart attack Maternal Grandmother    Heart attack Maternal Grandfather    Heart attack Paternal Grandfather     Social History Social History   Tobacco Use   Smoking status: Never   Smokeless tobacco: Never  Vaping Use   Vaping Use: Never used  Substance Use Topics   Alcohol use: No   Drug use: No    Review of Systems  Constitutional: Positive chills Eyes: No visual changes.  ENT: No sore throat.  Difficulty swallowing as above Cardiovascular: Denies chest pain. Respiratory: As above Gastrointestinal: No abdominal pain.  No nausea, no vomiting.   Genitourinary: Negative for dysuria. Musculoskeletal: Chronic back pain Skin: Negative for rash.  No hives Neurological: Negative  for headaches    ____________________________________________   PHYSICAL EXAM:  VITAL SIGNS: ED Triage Vitals  Enc Vitals Group     BP 08/22/21 1336 (!) 164/97     Pulse Rate 08/22/21 1336 81     Resp 08/22/21 1336 (!) 24     Temp 08/22/21 1336 98 F (36.7 C)     Temp Source 08/22/21 1336 Oral     SpO2 08/22/21 1330 94 %     Weight 08/22/21 1337 108.4 kg (239 lb)     Height 08/22/21 1337 1.905 m ('6\' 3"'$ )     Head Circumference --      Peak Flow --      Pain Score 08/22/21 1337 7     Pain Loc --      Pain Edu? --      Excl. in [place]? --     Constitutional: Alert and oriented.  Anxious Eyes: Conjunctivae are normal.  Head: Atraumatic. Nose: No congestion/rhinnorhea. Mouth/Throat: Mucous membranes are moist.  Pharynx is normal, no swelling Neck:  Painless ROM, no stridor Cardiovascular: Normal rate, regular rhythm. Grossly normal heart sounds.  Good peripheral circulation. Respiratory: Mild tachypnea.  No retractions. Lungs CTAB. Gastrointestinal: Soft and nontender. No distention.  No CVA tenderness.  Musculoskeletal: No lower extremity tenderness nor edema.  Warm and well perfused Neurologic:  Normal speech and language. No gross focal neurologic deficits are appreciated.  Skin:  Skin is warm, dry and intact. No rash noted. Psychiatric: Mood and affect are normal. Speech and behavior are normal.  ____________________________________________   LABS (all labs ordered are listed, but only abnormal results are displayed)  Labs Reviewed  COMPREHENSIVE METABOLIC PANEL - Abnormal; Notable for the following components:      Result Value   CO2 21 (*)    Glucose, Bld 106 (*)    BUN 25 (*)    Creatinine, Ser 1.32 (*)    All other components within normal limits  RESP PANEL BY RT-PCR (FLU A&B, COVID) ARPGX2  CBC  LACTIC ACID, PLASMA  LACTIC ACID, PLASMA  PROCALCITONIN  TROPONIN I (HIGH SENSITIVITY)    ____________________________________________  EKG   ____________________________________________  RADIOLOGY  Chest x-ray reviewed by me ____________________________________________   PROCEDURES  Procedure(s) performed: No  Procedures   Critical Care performed: No ____________________________________________   INITIAL IMPRESSION / ASSESSMENT AND PLAN / ED COURSE  Pertinent labs & imaging results that were available during my care of the patient were reviewed by me and considered in my medical decision making (see chart for details).   Patient presents with  complaints of shortness of breath but also difficulty swallowing and chills.  This raises suspicion for possible deep space neck infection although given the absence of pain that would be atypical.  Additional differential includes pneumonia, viral illness, less likely dissection or PE.  Will obtain labs, chest x-ray, CT soft tissue neck, CT angiography  I have asked my colleague to follow-up on the results, and dispo as appropriate    ____________________________________________   FINAL CLINICAL IMPRESSION(S) / ED DIAGNOSES  Final diagnoses:  SOB (shortness of breath)        Note:  This document was prepared using Dragon voice recognition software and may include unintentional dictation errors.    Lavonia Drafts, MD 08/22/21 1505

## 2021-08-22 NOTE — ED Provider Notes (Signed)
I sent care of this patient approximately 1500.  Please see op providers note for full details regarding patient's initial evaluation assessment.  In brief patient presents for assessment of 1 day of shortness of breath and chills.  He does not wear oxygen normally but reportedly is requiring 2 L to maintain sats above 92% on arrival.  CMP shows no significant electrolyte or metabolic derangement.  CBC shows no leukocytosis or acute anemia.  Troponin not elevated not suggestive of myocarditis and combination with ECG remarkable for sinus rhythm with some nonspecific ST changes in inferior leads without other clearance of acute ischemia or significant arrhythmia.  I have a low suspicion for ACS at this time.  Chest x-ray shows some interstitial markings concerning for possible early infiltrate versus some bronchitic changes.  Patient was complaining difficulty swallowing and CT of the neck and chest was ordered.  Plans to follow-up CTs and reassess.  The neck is no acute abnormalities identified.  CTA chest shows some CAD and small hiatal hernia but no evidence of dissection, aneurysm, large PE, focal consolidation suggestive of bacterial pneumonia or other clear acute process.  On reassessment patient was ambulated and was able to maintain SPO2 greater than 96%.  His heart rate briefly increased above 100 but then it immediately went back to the 90s on resting.  His respiratory rate is in the high teens and very briefly for a few seconds increased to low 20s before going back to the mid teens on stopping to catch his breath after 30 seconds of ambulating.  Overall I suspect patient's symptoms are related to acute viral bronchitis.  Given absence of hypoxic respiratory failure or other immediate life-threatening pathology at this time I think is stable for discharge and close outpatient follow-up.  Besides importance of staying hydrated I will write Rx for Zofran as he stated he felt little nauseous and  coughing.  We will also write For Tessalon.  COVID influenza PCR is negative.  Advise close outpatient follow-up.  Discharged stable condition.   Lucrezia Starch, MD 08/22/21 [phone]

## 2021-08-22 NOTE — ED Notes (Signed)
Pt's O2 sat remained between 98-100% RA w/ ambulation.  Pt did become increasingly labored d/t back pain.

## 2021-08-25 ENCOUNTER — Encounter (INDEPENDENT_AMBULATORY_CARE_PROVIDER_SITE_OTHER): Payer: Self-pay | Admitting: Nurse Practitioner

## 2021-08-25 NOTE — Progress Notes (Signed)
Subjective:    Patient ID: Jonathon Snow, male    DOB: 01-20-1961, 60 y.o.   MRN: [id number] Chief Complaint  Patient presents with   Follow-up    Jonathon Snow is a 60 year old male that is seen for evaluation of symptomatic varicose veins.  Recently, he had an event that was concerning for possible superficial thrombophlebitis.  The patient relates burning and stinging which worsened steadily throughout the course of the day, particularly with standing. The patient also notes an aching and throbbing pain over the varicosities, particularly with prolonged dependent positions. The symptoms are significantly improved with elevation.  The patient also notes that during hot weather the symptoms are greatly intensified. The patient states the pain from the varicose veins interferes with work, daily exercise, shopping and household maintenance. At this point, the symptoms are persistent and severe enough that they're having a negative impact on lifestyle and are interfering with daily activities.  There is a history of DVT, PE or superficial thrombophlebitis. There is no history of ulceration or hemorrhage. The patient denies a significant family history of varicose veins.   The patient has  graduated compression in the past. At the present time the patient has been using over-the-counter analgesics. There is no history of prior surgical intervention or sclerotherapy.      Review of Systems  Cardiovascular:  Positive for leg swelling.  Skin:  Positive for color change.  All other systems reviewed and are negative.     Objective:   Physical Exam Vitals reviewed.  HENT:     Head: Normocephalic.  Cardiovascular:     Rate and Rhythm: Normal rate.     Pulses: Normal pulses.  Pulmonary:     Effort: Pulmonary effort is normal.  Musculoskeletal:     Right lower leg: 1+ Edema present.     Left lower leg: 1+ Edema present.  Neurological:     Mental Status: He is alert. He is disoriented.   Psychiatric:        Mood and Affect: Mood normal.        Behavior: Behavior normal.        Thought Content: Thought content normal.        Judgment: Judgment normal.    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   Overview:  superior sagittal sinus, left transverse sinus and cortical veins    COVID-19 virus infection 12/2020   Depression    DVT (deep venous thrombosis) (HCC)    GERD (gastroesophageal reflux disease)    Heel spur, left 02/16/2019   Heel spur, right 02/16/2019   Hyperlipidemia    Hypertension    Lupus ([place])    Lymphedema 10/07/2018   Morbid obesity ([place])    Opiate abuse, episodic ([place]) 02/26/2018   Osteoporosis    Postphlebitic syndrome with ulcer, 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/18/2016   Presence of IVC filter 03/22/2020   Pulmonary embolism (HCC)    Renal disorder    Stage III    Social History   Socioeconomic History   Marital status: Divorced    Spouse name: Not on file   Number of children: Not on file   Years of  education: Not on file   Highest education level: Not on file  Occupational History   Not on file  Tobacco Use   Smoking status: Never   Smokeless tobacco: Never  Vaping Use   Vaping Use: Never used  Substance and Sexual Activity   Alcohol use: No   Drug use: No   Sexual activity: Never  Other Topics Concern   Not on file  Social History Narrative   Not on file   Social Determinants of Health   Financial Resource Strain: Not on file  Food Insecurity: Not on file  Transportation Needs: Not on file  Physical Activity: Not on file  Stress: Not on file  Social Connections: Not on file  Intimate Partner Violence: Not on file    Past Surgical History:  Procedure Laterality Date   ANKLE SURGERY  Right    COLONOSCOPY WITH PROPOFOL N/A 05/28/2020   Procedure: COLONOSCOPY WITH PROPOFOL;  Surgeon: Jonathon Bellows, MD;  Location: [facility] ENDOSCOPY;  Service: Endoscopy;  Laterality: N/A;   CYST EXCISION  92 or 93    Liver cyst removal [facility]   I & D EXTREMITY Right 04/29/2017   Procedure: IRRIGATION AND DEBRIDEMENT EXTREMITY;  Surgeon: Clayburn Pert, MD;  Location: [facility] ORS;  Service: General;  Laterality: Right;   IRRIGATION AND DEBRIDEMENT ABSCESS Left 04/29/2017   Procedure: IRRIGATION AND DEBRIDEMENT Scrotal ABSCESS;  Surgeon: Clayburn Pert, MD;  Location: [facility] ORS;  Service: General;  Laterality: Left;    Family History  Problem Relation Age of Onset   Hypertension Father    Heart disease Father    Clotting disorder Mother    Kidney disease Brother    Heart attack Maternal Grandmother    Heart attack Maternal Grandfather    Heart attack Paternal Grandfather     Allergies  Allergen Reactions   Enalapril Other (See Comments)   Vicodin [Hydrocodone-Acetaminophen] Hives and Rash    Severe headaches (also)    CBC Latest Ref Rng & Units 08/22/2021 03/19/2021 01/17/2020  WBC 4.0 - 10.5 K/uL 7.1 5.1 6.8  Hemoglobin 13.0 - 17.0 g/dL 13.1 15.4 16.2  Hematocrit 39.0 - 52.0 % 39.9 46.7 48.9  Platelets 150 - 400 K/uL 271 176 182      CMP     Component Value Date/Time   NA 138 08/22/2021 1357   NA 141 04/17/2016 0924   NA 138 04/10/2015 1036   K 3.8 08/22/2021 1357   K 3.5 04/10/2015 1036   CL 107 08/22/2021 1357   CL 108 04/10/2015 1036   CO2 21 (L) 08/22/2021 1357   CO2 25 04/10/2015 1036   GLUCOSE 106 (H) 08/22/2021 1357   GLUCOSE 96 04/10/2015 1036   BUN 25 (H) 08/22/2021 1357   BUN 23 04/17/2016 0924   BUN 16 04/10/2015 1036   CREATININE 1.32 (H) 08/22/2021 1357   CREATININE 1.68 (H) 03/19/2021 0855   CALCIUM 9.5 08/22/2021 1357   CALCIUM 8.4 (L) 04/10/2015 1036   PROT 7.4 08/22/2021 1357   PROT 6.9 04/17/2016 0924   PROT 7.0 04/10/2015 1036   ALBUMIN 3.7 08/22/2021  1357   ALBUMIN 3.9 04/17/2016 0924   ALBUMIN 3.9 04/10/2015 1036   AST 19 08/22/2021 1357   AST 19 04/10/2015 1036   ALT 17 08/22/2021 1357   ALT 16 (L) 04/10/2015 1036   ALKPHOS 90 08/22/2021 1357   ALKPHOS 71 04/10/2015 1036   BILITOT 1.1 08/22/2021 1357   BILITOT 0.4 04/17/2016 0924   BILITOT 0.7 04/10/2015 1036  GFRNONAA >60 08/22/2021 1357   GFRNONAA 46 (L) 11/07/2020 1020   GFRAA 53 (L) 11/07/2020 1020     No results found.     Assessment & Plan:   1. Varicose veins of both lower extremities with pain Recommend  I have reviewed my previous  discussion with the patient regarding  varicose veins and why they cause symptoms. Patient will continue  wearing graduated compression stockings class 1 on a daily basis, beginning first thing in the morning and removing them in the evening.    In addition, behavioral modification including elevation during the day was again discussed and this will continue.  The patient has utilized over the counter pain medications and has been exercising.  However, at this time conservative therapy has not alleviated the patient's symptoms of leg pain and swelling  Recommend: laser ablation of the  left great saphenous veins to eliminate the symptoms of pain and swelling of the lower extremities caused by the severe superficial venous reflux disease.    2. Primary hypertension Continue antihypertensive medications as already ordered, these medications have been reviewed and there are no changes at this time.    3. History of DVT (deep vein thrombosis) Patient has had a previous history of 2 DVTs.  He currently remains on anticoagulation.  No changes to current medication regimen.     Current Outpatient Medications on File Prior to Visit  Medication Sig Dispense Refill   albuterol (VENTOLIN HFA) 108 (90 Base) MCG/ACT inhaler Inhale 2 puffs into the lungs every 6 (six) hours as needed for wheezing or shortness of breath. 18 g 2    amLODipine (NORVASC) 5 MG tablet Take 1 tablet (5 mg total) by mouth daily. 90 tablet 3   apixaban (ELIQUIS) 5 MG TABS tablet Take 1 tablet (5 mg total) by mouth 2 (two) times daily. 60 tablet 2   chlorpheniramine-HYDROcodone (TUSSIONEX PENNKINETIC ER) 10-8 MG/5ML SUER Take 5 mLs by mouth at bedtime as needed for cough. 70 mL 0   COVID-19 mRNA vaccine, Pfizer, 30 MCG/0.3ML injection USE AS DIRECTED .3 mL 0   gabapentin (NEURONTIN) 100 MG capsule Take 100 mg by mouth 2 (two) times daily.     gabapentin (NEURONTIN) 300 MG capsule Take by mouth.     HYDROcodone-acetaminophen (NORCO) 10-325 MG tablet      ipratropium-albuterol (DUONEB) 0.5-2.5 (3) MG/3ML SOLN Take 3 mLs by nebulization every 4 (four) hours as needed. 360 mL 3   losartan (COZAAR) 100 MG tablet Take 1 tablet (100 mg total) by mouth daily. 90 tablet 3   Mepolizumab (NUCALA) 100 MG/ML SOAJ Inject into the skin.     montelukast (SINGULAIR) 10 MG tablet Take 1 tablet (10 mg total) by mouth at bedtime. 90 tablet 3   Multiple Vitamins-Minerals (MULTIVITAMIN WITH MINERALS) tablet Take 1 tablet by mouth daily.     mycophenolate (CELLCEPT) 500 MG tablet Take 1,000 mg by mouth 2 (two) times daily.     predniSONE (DELTASONE) 10 MG tablet Take 10 mg by mouth daily with breakfast.     rosuvastatin (CRESTOR) 20 MG tablet Take one pill every other night before bed. 90 tablet 0   loratadine (CLARITIN) 10 MG tablet Take 1 tablet (10 mg total) by mouth daily. (Patient not taking: Reported on 08/08/2021) 90 tablet 3   traMADol (ULTRAM) 50 MG tablet Take by mouth every 6 (six) hours as needed. (Patient not taking: Reported on 08/08/2021)     triamcinolone ointment (KENALOG) 0.1 % Apply 1 application  topically 2 (two) times daily as needed. To affected rash/patch areas to leg for up to 1 week at a time (Patient not taking: Reported on 08/08/2021) 30 g 0   No current facility-administered medications on file prior to visit.    There are no Patient  Instructions on file for this visit. No follow-ups on file.   Kris Hartmann, NP

## 2021-08-29 ENCOUNTER — Ambulatory Visit: Payer: 59 | Admitting: Urology

## 2021-08-29 NOTE — Progress Notes (Signed)
Name: Jonathon Snow   MRN: [id number]    DOB: 07/01/1961   Date:08/30/2021       Progress Note  Subjective  Chief Complaint  Trouble Breathing  I connected with  Lucienne Minks  on 08/30/21 at  1:40 PM EDT by a video enabled telemedicine application and verified that I am speaking with the correct person using two identifiers.  I discussed the limitations of evaluation and management by telemedicine and the availability of in person appointments. The patient expressed understanding and agreed to proceed with the virtual visit  Staff also discussed with the patient that there may be a patient responsible charge related to this service. Patient Location: at home  Provider Location: [facility] Additional Individuals present: alone   HPI  SOB: patient has been having SOB since 0830/2022. He went to [facility] on 08/22/21 because symptoms were much worse. He was sent home since labs were stable, negative CT angiogram and CT neck ( he was having a sensation of swelling on his throat), his pulse ox was 96 % and he was sent home. He states symptoms waxes and wanes, improves when he lays down flat. He has episodes of diaphoresis and tremors. He is feeling nervous. He denies fever, he has purchased a pulse ox but has not arrived yet.   He has multiple medical problems, sees Dr. Lanney Gins for asthma but missed his last visit due to COVID-19  He has lupus and sees Rheumatologist : taking prednisone History of DVT and PE: on Eliquis   Patient Active Problem List   Diagnosis Date Noted   Hypertension 06/14/2021   Morbid obesity ([place])    COVID-19 virus infection 12/2020   Prediabetes 11/08/2020   Stage 3a chronic kidney disease ([place]) 11/08/2020   Seasonal allergies 11/08/2020   Rhinosinusitis 11/08/2020   Long term current use of systemic steroids 11/08/2020   Anticoagulant disorder ([place]) 11/07/2020   Senile purpura ([place]) 11/07/2020   History of DVT (deep vein thrombosis) 03/22/2020   Herpes zoster  infection of lumbar region 02/20/2020   Severe episode of recurrent major depressive disorder, without psychotic features ([place]) 09/12/2019   Other spondylosis with radiculopathy, lumbar region 02/16/2019   Osteoporosis 10/12/2018   Chronic venous insufficiency 10/07/2018   SLE glomerulonephritis syndrome, 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[place]) 03/03/2018   Syncope 12/29/2017   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 ([place]) 10/29/2016   Mixed hyperlipidemia 01/15/2016   Benign essential hypertension 01/15/2016   Obesity (BMI 30.0-34.9) 01/15/2016   Long term current use of anticoagulant 10/04/2015   Systemic lupus erythematosus ([place]) 05/30/2015   Pulmonary embolism ([place]) 05/30/2015   COPD, moderate ([place]) 06/27/2014   History of pulmonary embolism 12/11/2013   Nodule of right lung 12/11/2013   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   Cystic disease of liver 08/21/2013    Past Surgical History:  Procedure Laterality Date   ANKLE SURGERY Right    COLONOSCOPY WITH PROPOFOL N/A 05/28/2020   Procedure: COLONOSCOPY WITH PROPOFOL;  Surgeon: Jonathon Bellows, MD;  Location: [facility] ENDOSCOPY;  Service: Endoscopy;  Laterality: N/A;   CYST EXCISION  92 or 93    Liver cyst removal [facility]   I & D EXTREMITY Right 04/29/2017   Procedure: IRRIGATION AND DEBRIDEMENT EXTREMITY;  Surgeon: Clayburn Pert, MD;  Location: [facility] ORS;  Service: General;  Laterality: Right;   IRRIGATION AND DEBRIDEMENT ABSCESS Left 04/29/2017   Procedure: IRRIGATION AND DEBRIDEMENT Scrotal ABSCESS;  Surgeon: Clayburn Pert,  MD;  Location: [facility] ORS;  Service: General;  Laterality: Left;    Family History  Problem Relation Age of Onset   Hypertension Father    Heart disease Father    Clotting disorder Mother    Kidney disease Brother    Heart attack Maternal Grandmother    Heart attack Maternal Grandfather    Heart attack Paternal Grandfather       Current Outpatient Medications:    albuterol  (VENTOLIN HFA) 108 (90 Base) MCG/ACT inhaler, Inhale 2 puffs into the lungs every 6 (six) hours as needed for wheezing or shortness of breath., Disp: 18 g, Rfl: 2   amLODipine (NORVASC) 5 MG tablet, Take 1 tablet (5 mg total) by mouth daily., Disp: 90 tablet, Rfl: 3   apixaban (ELIQUIS) 5 MG TABS tablet, Take 1 tablet (5 mg total) by mouth 2 (two) times daily., Disp: 60 tablet, Rfl: 2   chlorpheniramine-HYDROcodone (TUSSIONEX PENNKINETIC ER) 10-8 MG/5ML SUER, Take 5 mLs by mouth at bedtime as needed for cough., Disp: 70 mL, Rfl: 0   COVID-19 mRNA Vac-TriS, Pfizer, (PFIZER-BIONT COVID-19 VAC-TRIS) SUSP injection, Inject into the muscle., Disp: 0.3 mL, Rfl: 0   COVID-19 mRNA vaccine, Pfizer, 30 MCG/0.3ML injection, USE AS DIRECTED, Disp: .3 mL, Rfl: 0   gabapentin (NEURONTIN) 100 MG capsule, Take 100 mg by mouth 2 (two) times daily., Disp: , Rfl:    gabapentin (NEURONTIN) 300 MG capsule, Take by mouth., Disp: , Rfl:    HYDROcodone-acetaminophen (NORCO) 10-325 MG tablet, , Disp: , Rfl:    ipratropium-albuterol (DUONEB) 0.5-2.5 (3) MG/3ML SOLN, Take 3 mLs by nebulization every 4 (four) hours as needed., Disp: 360 mL, Rfl: 3   loratadine (CLARITIN) 10 MG tablet, Take 1 tablet (10 mg total) by mouth daily., Disp: 90 tablet, Rfl: 3   losartan (COZAAR) 100 MG tablet, Take 1 tablet (100 mg total) by mouth daily., Disp: 90 tablet, Rfl: 3   Mepolizumab (NUCALA) 100 MG/ML SOAJ, Inject into the skin., Disp: , Rfl:    montelukast (SINGULAIR) 10 MG tablet, Take 1 tablet (10 mg total) by mouth at bedtime., Disp: 90 tablet, Rfl: 3   Multiple Vitamins-Minerals (MULTIVITAMIN WITH MINERALS) tablet, Take 1 tablet by mouth daily., Disp: , Rfl:    mycophenolate (CELLCEPT) 500 MG tablet, Take 1,000 mg by mouth 2 (two) times daily., Disp: , Rfl:    nystatin (MYCOSTATIN/NYSTOP) powder, Apply 1 application topically 3 (three) times daily., Disp: 15 g, Rfl: 0   nystatin-triamcinolone ointment (MYCOLOG), Apply 1 application  topically 2 (two) times daily., Disp: 30 g, Rfl: 0   ondansetron (ZOFRAN) 4 MG tablet, Take 1 tablet (4 mg total) by mouth every 8 (eight) hours as needed for up to 10 doses for nausea or vomiting., Disp: 10 tablet, Rfl: 0   predniSONE (DELTASONE) 10 MG tablet, Take 10 mg by mouth daily with breakfast., Disp: , Rfl:    rosuvastatin (CRESTOR) 20 MG tablet, Take one pill every other night before bed., Disp: 90 tablet, Rfl: 0   traMADol (ULTRAM) 50 MG tablet, Take by mouth every 6 (six) hours as needed., Disp: , Rfl:    triamcinolone ointment (KENALOG) 0.1 %, Apply 1 application topically 2 (two) times daily as needed. To affected rash/patch areas to leg for up to 1 week at a time, Disp: 30 g, Rfl: 0  Allergies  Allergen Reactions   Enalapril Other (See Comments)   Vicodin [Hydrocodone-Acetaminophen] Hives and Rash    Severe headaches (also)    I personally  reviewed active problem list, medication list, allergies, family history, social history, health maintenance with the patient/caregiver today.   ROS  Ten systems reviewed and is negative except as mentioned in HPI   Objective  Virtual encounter, vitals not obtained.  There is no height or weight on file to calculate BMI.  Physical Exam  He looks anxious, hand tremors, tachypnea noticed    PHQ2/9: Depression screen [facility] 2/9 08/30/2021 08/08/2021 07/16/2021 03/19/2021 11/07/2020  Decreased Interest 1 2 0 1 0  Down, Depressed, Hopeless 0 0 0 0 0  PHQ - 2 Score 1 2 0 1 0  Altered sleeping 1 0 0 0 -  Tired, decreased energy 1 1 0 0 -  Change in appetite 0 0 0 0 -  Feeling bad or failure about yourself  0 0 0 0 -  Trouble concentrating 0 0 0 0 -  Moving slowly or fidgety/restless 0 0 0 0 -  Suicidal thoughts 0 0 0 0 -  PHQ-9 Score 3 3 0 1 -  Difficult doing work/chores - Not difficult at all - Not difficult at all -  Some recent data might be hidden   PHQ-2/9 Result is negative.    Fall Risk: Fall Risk  08/30/2021 08/08/2021  07/16/2021 03/19/2021 11/07/2020  Falls in the past year? 0 0 0 0 0  Number falls in past yr: 0 0 0 0 0  Injury with Fall? 0 0 0 0 0  Risk for fall due to : No Fall Risks - - - -  Follow up Falls prevention discussed Falls evaluation completed - - Falls evaluation completed     Assessment & Plan  1. SOB (shortness of breath)  Reached out to Dr. Lanney Gins to see if patient can have an appointment next week Advised patient to go to [facility] if symptoms gets worse  2. History of pulmonary embolism  Negative CTA on 09/01  3. Moderate asthma, unspecified whether complicated, unspecified whether persistent  It does not seem to be an asthma flare , better when flat / resting    I discussed the assessment and treatment plan with the patient. The patient was provided an opportunity to ask questions and all were answered. The patient agreed with the plan and demonstrated an understanding of the instructions.  The patient was advised to call back or seek an in-person evaluation if the symptoms worsen or if the condition fails to improve as anticipated.  I provided 25 minutes of non-face-to-face time during this encounter.

## 2021-08-30 ENCOUNTER — Encounter: Payer: Self-pay | Admitting: Family Medicine

## 2021-08-30 ENCOUNTER — Telehealth (INDEPENDENT_AMBULATORY_CARE_PROVIDER_SITE_OTHER): Payer: 59 | Admitting: Family Medicine

## 2021-08-30 DIAGNOSIS — Z86711 Personal history of pulmonary embolism: Secondary | ICD-10-CM | POA: Diagnosis not present

## 2021-08-30 DIAGNOSIS — R0602 Shortness of breath: Secondary | ICD-10-CM

## 2021-08-30 DIAGNOSIS — J45909 Unspecified asthma, uncomplicated: Secondary | ICD-10-CM

## 2021-08-30 MED ORDER — HYDROXYZINE HCL 10 MG PO TABS: 10.0000 mg | ORAL_TABLET | Freq: Three times a day (TID) | ORAL | 0 refills | Status: DC | PRN

## 2021-09-06 ENCOUNTER — Ambulatory Visit: Payer: Self-pay | Admitting: *Deleted

## 2021-09-06 NOTE — Telephone Encounter (Signed)
Patient is calling with SOB- labored breathing. Patient was seen at ED and has follow with PCP and pulmonology. Patient is not breathing- breathing is labored while speaking- patient has to to break while speaking. Advised ED for reevaluation of symptoms- he states he will not go- they did not help him last time he went. Advised needs reevaluation- he states he will call pulmonology- advised please go to ED. Reason for Disposition  [1] MODERATE difficulty breathing (e.g., speaks in phrases, SOB even at rest, pulse 100-120) AND [2] NEW-onset or WORSE than normal  Answer Assessment - Initial Assessment Questions 1. RESPIRATORY STATUS: "Describe your breathing?" (e.g., wheezing, shortness of breath, unable to speak, severe coughing)      SOB- labored breathing 2. ONSET: "When did this breathing problem begin?"      2 weeks ago 3. PATTERN "Does the difficult breathing come and go, or has it been constant since it started?"      constant 4. SEVERITY: "How bad is your breathing?" (e.g., mild, moderate, severe)    - MILD: No SOB at rest, mild SOB with walking, speaks normally in sentences, can lie down, no retractions, pulse < 100.    - MODERATE: SOB at rest, SOB with minimal exertion and prefers to sit, cannot lie down flat, speaks in phrases, mild retractions, audible wheezing, pulse 100-120.    - SEVERE: Very SOB at rest, speaks in single words, struggling to breathe, sitting hunched forward, retractions, pulse > 120      severe 5. RECURRENT SYMPTOM: "Have you had difficulty breathing before?" If Yes, ask: "When was the last time?" and "What happened that time?"      Yes- pulmonary embolism 6. CARDIAC HISTORY: "Do you have any history of heart disease?" (e.g., heart attack, angina, bypass surgery, angioplasty)      no 7. LUNG HISTORY: "Do you have any history of lung disease?"  (e.g., pulmonary embolus, asthma, emphysema)     Pulmonary embolism  8. CAUSE: "What do you think is causing the breathing  problem?"      unsure 9. OTHER SYMPTOMS: "Do you have any other symptoms? (e.g., dizziness, runny nose, cough, chest pain, fever)     Chills, cough-slight, weakness 10. O2 SATURATION MONITOR:  "Do you use an oxygen saturation monitor (pulse oximeter) at home?" If Yes, "What is your reading (oxygen level) today?" "What is your usual oxygen saturation reading?" (e.g., 95%)       no 11. PREGNANCY: "Is there any chance you are pregnant?" "When was your last menstrual period?"       N/a 12. TRAVEL: "Have you traveled out of the country in the last month?" (e.g., travel history, exposures)       no  Protocols used: Breathing Difficulty-A-AH

## 2021-09-06 NOTE — Telephone Encounter (Signed)
Called pt to check on him, he states has called his pulmonologist and is waiting to get a call back.  He is refusing to go to ER b/c he states they don't do anything for him.

## 2021-09-09 ENCOUNTER — Ambulatory Visit: Payer: 59 | Admitting: Dermatology

## 2021-09-09 ENCOUNTER — Emergency Department: Payer: 59

## 2021-09-09 ENCOUNTER — Ambulatory Visit: Payer: Self-pay

## 2021-09-09 ENCOUNTER — Encounter: Payer: Self-pay | Admitting: *Deleted

## 2021-09-09 ENCOUNTER — Other Ambulatory Visit: Payer: Self-pay

## 2021-09-09 DIAGNOSIS — Z20822 Contact with and (suspected) exposure to covid-19: Secondary | ICD-10-CM | POA: Diagnosis not present

## 2021-09-09 DIAGNOSIS — D485 Neoplasm of uncertain behavior of skin: Secondary | ICD-10-CM

## 2021-09-09 DIAGNOSIS — R0602 Shortness of breath: Secondary | ICD-10-CM | POA: Diagnosis present

## 2021-09-09 DIAGNOSIS — J449 Chronic obstructive pulmonary disease, unspecified: Secondary | ICD-10-CM | POA: Insufficient documentation

## 2021-09-09 DIAGNOSIS — I129 Hypertensive chronic kidney disease with stage 1 through stage 4 chronic kidney disease, or unspecified chronic kidney disease: Secondary | ICD-10-CM | POA: Diagnosis not present

## 2021-09-09 DIAGNOSIS — N183 Chronic kidney disease, stage 3 unspecified: Secondary | ICD-10-CM | POA: Diagnosis not present

## 2021-09-09 DIAGNOSIS — D489 Neoplasm of uncertain behavior, unspecified: Secondary | ICD-10-CM

## 2021-09-09 DIAGNOSIS — Z79899 Other long term (current) drug therapy: Secondary | ICD-10-CM | POA: Insufficient documentation

## 2021-09-09 LAB — CBC WITH DIFFERENTIAL/PLATELET
Abs Immature Granulocytes: 0.02 10*3/uL (ref 0.00–0.07)
Basophils Absolute: 0 10*3/uL (ref 0.0–0.1)
Basophils Relative: 0 %
Eosinophils Absolute: 0 10*3/uL (ref 0.0–0.5)
Eosinophils Relative: 0 %
HCT: 42.2 % (ref 39.0–52.0)
Hemoglobin: 13.6 g/dL (ref 13.0–17.0)
Immature Granulocytes: 0 %
Lymphocytes Relative: 17 %
Lymphs Abs: 1.3 10*3/uL (ref 0.7–4.0)
MCH: 28.9 pg (ref 26.0–34.0)
MCHC: 32.2 g/dL (ref 30.0–36.0)
MCV: 89.6 fL (ref 80.0–100.0)
Monocytes Absolute: 1.1 10*3/uL — ABNORMAL HIGH (ref 0.1–1.0)
Monocytes Relative: 14 %
Neutro Abs: 5.1 10*3/uL (ref 1.7–7.7)
Neutrophils Relative %: 69 %
Platelets: 239 10*3/uL (ref 150–400)
RBC: 4.71 MIL/uL (ref 4.22–5.81)
RDW: 15.4 % (ref 11.5–15.5)
WBC: 7.5 10*3/uL (ref 4.0–10.5)
nRBC: 0 % (ref 0.0–0.2)

## 2021-09-09 LAB — COMPREHENSIVE METABOLIC PANEL
ALT: 19 U/L (ref 0–44)
AST: 21 U/L (ref 15–41)
Albumin: 3.9 g/dL (ref 3.5–5.0)
Alkaline Phosphatase: 93 U/L (ref 38–126)
Anion gap: 9 (ref 5–15)
BUN: 32 mg/dL — ABNORMAL HIGH (ref 6–20)
CO2: 21 mmol/L — ABNORMAL LOW (ref 22–32)
Calcium: 9.5 mg/dL (ref 8.9–10.3)
Chloride: 107 mmol/L (ref 98–111)
Creatinine, Ser: 2.41 mg/dL — ABNORMAL HIGH (ref 0.61–1.24)
GFR, Estimated: 30 mL/min — ABNORMAL LOW (ref >60)
Glucose, Bld: 100 mg/dL — ABNORMAL HIGH (ref 70–99)
Potassium: 4.5 mmol/L (ref 3.5–5.1)
Sodium: 137 mmol/L (ref 135–145)
Total Bilirubin: 0.8 mg/dL (ref 0.3–1.2)
Total Protein: 7.7 g/dL (ref 6.5–8.1)

## 2021-09-09 LAB — RESP PANEL BY RT-PCR (FLU A&B, COVID) ARPGX2
Influenza A by PCR: NEGATIVE
Influenza B by PCR: NEGATIVE
SARS Coronavirus 2 by RT PCR: NEGATIVE

## 2021-09-09 LAB — TROPONIN I (HIGH SENSITIVITY)
Troponin I (High Sensitivity): 5 ng/L (ref <18)
Troponin I (High Sensitivity): 6 ng/L (ref <18)

## 2021-09-09 MED ORDER — MUPIROCIN 2 % EX OINT: 1.0000 "application " | TOPICAL_OINTMENT | Freq: Every day | CUTANEOUS | 0 refills | Status: DC

## 2021-09-09 NOTE — ED Provider Notes (Signed)
Emergency Medicine Provider Triage Evaluation Note  Jonathon Snow , a 60 y.o. male  was evaluated in triage.  Pt complains of  shortness of breath that has progressively worsened over the past 24 hours.History of PE and DVT on Eliquis. Back surgery in June with persistent back pain.  Review of Systems  Positive: Shortness of breath, back pain Negative: Leg pain  Physical Exam  BP (!) 159/96   Pulse 85   Temp 97.9 F (36.6 C) (Oral)   Resp (!) 26   Ht '6\' 3"'$  (1.905 m)   Wt 108.4 kg   SpO2 100%   BMI 29.87 kg/m  Gen:   Awake, no distress   Resp:  3 word sentences, dyspnea MSK:   Moves extremities without difficulty  Other:    Medical Decision Making  Medically screening exam initiated at 5:32 PM.  Appropriate orders placed.  Jonathon Snow was informed that the remainder of the evaluation will be completed by another provider, this initial triage assessment does not replace that evaluation, and the importance of remaining in the ED until their evaluation is complete.    Victorino Dike, FNP 09/09/21 [id number]    Lavonia Drafts, MD 09/11/21 [phone]

## 2021-09-09 NOTE — Telephone Encounter (Signed)
Pt called SOB, talking in phrases say "I need help.'" Advised pt to go to ED and pt refused, advised pt to go to [facility] if not ED. Pt hung up.    Reason for Disposition  [1] MODERATE difficulty breathing (e.g., speaks in phrases, SOB even at rest, pulse 100-120) AND [2] NEW-onset or WORSE than normal  Answer Assessment - Initial Assessment Questions 1. RESPIRATORY STATUS: "Describe your breathing?" (e.g., wheezing, shortness of breath, unable to speak, severe coughing)      SOB at rest, trouble swallowing 2. ONSET: "When did this breathing problem begin?"       3. PATTERN "Does the difficult breathing come and go, or has it been constant since it started?"      constant 4. SEVERITY: "How bad is your breathing?" (e.g., mild, moderate, severe)    - MILD: No SOB at rest, mild SOB with walking, speaks normally in sentences, can lie down, no retractions, pulse < 100.    - MODERATE: SOB at rest, SOB with minimal exertion and prefers to sit, cannot lie down flat, speaks in phrases, mild retractions, audible wheezing, pulse 100-120.    - SEVERE: Very SOB at rest, speaks in single words, struggling to breathe, sitting hunched forward, retractions, pulse > 120      severe 5. RECURRENT SYMPTOM: "Have you had difficulty breathing before?" If Yes, ask: "When was the last time?" and "What happened that time?"      no 6. CARDIAC HISTORY: "Do you have any history of heart disease?" (e.g., heart attack, angina, bypass surgery, angioplasty)      Pt hung up 7. LUNG HISTORY: "Do you have any history of lung disease?"  (e.g., pulmonary embolus, asthma, emphysema)     asthma 8. CAUSE: "What do you think is causing the breathing problem?"      Does not know 9. OTHER SYMPTOMS: "Do you have any other symptoms? (e.g., dizziness, runny nose, cough, chest pain, fever)     cough 10. O2 SATURATION MONITOR:  "Do you use an oxygen saturation monitor (pulse oximeter) at home?" If Yes, "What is your reading (oxygen level)  today?" "What is your usual oxygen saturation reading?" (e.g., 95%)       no 11. PREGNANCY: "Is there any chance you are pregnant?" "When was your last menstrual period?"       N/a 12. TRAVEL: "Have you traveled out of the country in the last month?" (e.g., travel history, exposures)       *No Answer*  Protocols used: Breathing Difficulty-A-AH

## 2021-09-09 NOTE — Telephone Encounter (Signed)
Called Jonathon Snow to get him scheduled with Dr Rory Percy for SOB. Jonathon Snow was advised again to go to ER and was told if his breathing is bad when he comes into the office tomorrow that 911 will be called. Jonathon Snow understood and will be here on 09/10/21 for his appt

## 2021-09-09 NOTE — Progress Notes (Signed)
   Follow-Up Visit   Subjective  Jonathon Snow is a 60 y.o. male who presents for the following: Follow-up (Patient here today concerning a spot on right foot. ). The area is persistent despite topical wart treatment.  Is painful and he would like more aggressive treatment today.  The following portions of the chart were reviewed this encounter and updated as appropriate:  Tobacco  Allergies  Meds  Problems  Med Hx  Surg Hx  Fam Hx     Review of Systems: No other skin or systemic complaints except as noted in HPI or Assessment and Plan.  Objective  Well appearing patient in no apparent distress; mood and affect are within normal limits.  A focused examination was performed including right foot. Relevant physical exam findings are noted in the Assessment and Plan.  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 Anesthesia: the lesion was anesthetized in a standard fashion   Anesthetic:  1% lidocaine w/ epinephrine 1-100,000 buffered w/ 8.4% NaHCO3 Instrument used: flexible razor blade   Hemostasis achieved with: aluminum chloride   Outcome: patient tolerated procedure well   Post-procedure details: wound care instructions given   Additional details:  Mupirocin and a bandage applied  mupirocin ointment (BACTROBAN) 2 % Apply 1 application topically daily. Until healed at bottom of right foot  Specimen 2 - Surgical pathology Differential Diagnosis: R/o verruca vs callous rule out cancer  Check Margins: No R/o verruca vs callous  Discussed if biopsy proven verruca could reoccur and need further  treatment If pathology shows callus, will consider referral to podiatrist.  Return for 2 month wart follow up. IRuthell Rummage, CMA, am acting as scribe for Sarina Ser, MD. Documentation: I have reviewed the above documentation for accuracy and completeness, and I agree with the above.  Sarina Ser, MD

## 2021-09-09 NOTE — ED Triage Notes (Signed)
Pt reports sob and diff swallowing with nausea and diarrhea.   Pt denies chest pain  Pt had back surgery 6/22   pt alert  speech clear.

## 2021-09-09 NOTE — Patient Instructions (Signed)
Biopsy Wound Care Instructions  Leave the original bandage on for 24 hours if possible.  If the bandage becomes soaked or soiled before that time, it is OK to remove it and examine the wound.  A small amount of post-operative bleeding is normal.  If excessive bleeding occurs, remove the bandage, place gauze over the site and apply continuous pressure (no peeking) over the area for 30 minutes. If this does not work, please call our clinic as soon as possible or page your doctor if it is after hours.   Once a day, cleanse the wound with soap and water. It is fine to shower. If a thick crust develops you may use a Q-tip dipped into dilute hydrogen peroxide (mix 1:1 with water) to dissolve it.  Hydrogen peroxide can slow the healing process, so use it only as needed.    After washing, apply petroleum jelly (Vaseline) or an antibiotic ointment if your doctor prescribed one for you, followed by a bandage.    For best healing, the wound should be covered with a layer of ointment at all times. If you are not able to keep the area covered with a bandage to hold the ointment in place, this may mean re-applying the ointment several times a day.  Continue this wound care until the wound has healed and is no longer open.   Itching and mild discomfort is normal during the healing process. However, if you develop pain or severe itching, please call our office.   If you have any discomfort, you can take Tylenol (acetaminophen) or ibuprofen as directed on the bottle. (Please do not take these if you have an allergy to them or cannot take them for another reason).  Some redness, tenderness and white or yellow material in the wound is normal healing.  If the area becomes very sore and red, or develops a thick yellow-green material (pus), it may be infected; please notify us.    If you have stitches, return to clinic as directed to have the stitches removed. You will continue wound care for 2-3 days after the stitches  are removed.   Wound healing continues for up to one year following surgery. It is not unusual to experience pain in the scar from time to time during the interval.  If the pain becomes severe or the scar thickens, you should notify the office.    A slight amount of redness in a scar is expected for the first six months.  After six months, the redness will fade and the scar will soften and fade.  The color difference becomes less noticeable with time.  If there are any problems, return for a post-op surgery check at your earliest convenience.  To improve the appearance of the scar, you can use silicone scar gel, cream, or sheets (such as Mederma or Serica) every night for up to one year. These are available over the counter (without a prescription).  Please call our office at [phone] for any questions or concerns.  If you have any questions or concerns for your doctor, please call our main line at [phone] and press option 4 to reach your doctor's medical assistant. If no one answers, please leave a voicemail as directed and we will return your call as soon as possible. Messages left after 4 pm will be answered the following business day.   You may also send [place] a message via [place]. We typically respond to MyChart messages within 1-2 business days.  For prescription refills,  please ask your pharmacy to contact our office. Our fax number is [phone].  If you have an urgent issue when the clinic is closed that cannot wait until the next business day, you can page your doctor at the number below.    Please note that while we do our best to be available for urgent issues outside of office hours, we are not available 24/7.   If you have an urgent issue and are unable to reach [place], you may choose to seek medical care at your doctor's office, retail clinic, urgent care center, or emergency room.  If you have a medical emergency, please immediately call 911 or go to the emergency  department.  Pager Numbers  - Dr. Nehemiah Massed: [phone]  - Dr. Laurence Ferrari: [phone]  - Dr. Nicole Kindred: [phone]  In the event of inclement weather, please call our main line at [phone] for an update on the status of any delays or closures.  Dermatology Medication Tips: Please keep the boxes that topical medications come in in order to help keep track of the instructions about where and how to use these. Pharmacies typically print the medication instructions only on the boxes and not directly on the medication tubes.   If your medication is too expensive, please contact our office at [phone] option 4 or send [place] a message through [place].   We are unable to tell what your co-pay for medications will be in advance as this is different depending on your insurance coverage. However, we may be able to find a substitute medication at lower cost or fill out paperwork to get insurance to cover a needed medication.   If a prior authorization is required to get your medication covered by your insurance company, please allow [place] 1-2 business days to complete this process.  Drug prices often vary depending on where the prescription is filled and some pharmacies may offer cheaper prices.  The website www.goodrx.com contains coupons for medications through different pharmacies. The prices here do not account for what the cost may be with help from insurance (it may be cheaper with your insurance), but the website can give you the price if you did not use any insurance.  - You can print the associated coupon and take it with your prescription to the pharmacy.  - You may also stop by our office during regular business hours and pick up a GoodRx coupon card.  - If you need your prescription sent electronically to a different pharmacy, notify our office through [facility] or by phone at [phone] option 4.

## 2021-09-09 NOTE — Telephone Encounter (Signed)
I guess we can see if someone here can see him this week?

## 2021-09-09 NOTE — Telephone Encounter (Signed)
Pt has been told mutliple times to go to ER, we will try to schedule

## 2021-09-09 NOTE — Progress Notes (Incomplete)
09/09/21 3:35 PM   Jonathon Snow 07-16-1961 [id number]  Referring provider:  Kathrine Haddock, NP [address],  [place] [address] No chief complaint on file.    HPI: Jonathon Snow is a 60 y.o.male who presents today for further evaluation of urinary frequency.   He was last seen in office in 2018 for evaluation of microscopic hematuria. Cystoscopy was unremarkable.   At the time he also had a scrotal abscess which was I&D to the operating room in conjunction with an axillary abscess by general surgery.   He has a history of PE and is on chronic Eliquis.        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   Overview:  superior sagittal sinus, left transverse sinus and cortical veins    COVID-19 virus infection 12/2020   Depression    DVT (deep venous thrombosis) (HCC)    GERD (gastroesophageal reflux disease)    Heel spur, left 02/16/2019   Heel spur, right 02/16/2019   Hyperlipidemia    Hypertension    Lupus ([place])    Lymphedema 10/07/2018   Morbid obesity ([place])    Opiate abuse, episodic ([place]) 02/26/2018   Osteoporosis    Postphlebitic syndrome with ulcer, 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   Presence of IVC filter 03/22/2020   Pulmonary embolism ([place])    Renal disorder    Stage III    Surgical History: Past Surgical History:  Procedure Laterality Date   ANKLE SURGERY Right    COLONOSCOPY WITH PROPOFOL N/A 05/28/2020   Procedure: COLONOSCOPY WITH PROPOFOL;  Surgeon: Jonathon Bellows, MD;  Location: [facility] ENDOSCOPY;  Service: Endoscopy;  Laterality: N/A;   CYST EXCISION  92 or 93    Liver cyst removal [facility]   I & D EXTREMITY Right 04/29/2017   Procedure: IRRIGATION AND DEBRIDEMENT EXTREMITY;  Surgeon: Clayburn Pert, MD;  Location: [facility] ORS;  Service: General;  Laterality: Right;   IRRIGATION AND DEBRIDEMENT ABSCESS Left 04/29/2017   Procedure: IRRIGATION AND DEBRIDEMENT Scrotal ABSCESS;  Surgeon: Clayburn Pert, MD;  Location: [facility] ORS;  Service: General;  Laterality: Left;    Home Medications:  Allergies as of 09/10/2021       Reactions   Enalapril Other (See Comments)   Vicodin [hydrocodone-acetaminophen] Hives, Rash   Severe headaches (also)        Medication List        Accurate as of September 09, 2021  3:35 PM. If you have any questions, ask your nurse or doctor.          albuterol 108 (90 Base) MCG/ACT inhaler Commonly known as: VENTOLIN HFA Inhale 2 puffs into the lungs every 6 (six) hours as needed for wheezing or shortness of breath.   amLODipine 5 MG tablet Commonly known as: NORVASC Take 1 tablet (5 mg total) by mouth daily.   apixaban 5 MG Tabs tablet Commonly known as: Eliquis Take 1 tablet (5 mg total) by mouth 2 (two) times daily.   gabapentin 100 MG capsule Commonly known as: NEURONTIN Take 100 mg by mouth 2 (two) times daily.   gabapentin 300 MG capsule Commonly known as: NEURONTIN Take by mouth.   HYDROcodone-acetaminophen 10-325 MG tablet Commonly known as: NORCO   hydrOXYzine 10 MG tablet Commonly known as: ATARAX/VISTARIL Take 1-2 tablets (10-20 mg total) by mouth 3 (three) times daily as needed.   ipratropium-albuterol 0.5-2.5 (3) MG/3ML Soln Commonly known as: DUONEB Take  3 mLs by nebulization every 4 (four) hours as needed.   loratadine 10 MG tablet Commonly known as: CLARITIN Take 1 tablet (10 mg total) by mouth daily.   losartan 100 MG tablet Commonly known as: COZAAR Take 1 tablet (100 mg total) by mouth daily.   montelukast 10 MG tablet Commonly known as: SINGULAIR Take 1 tablet (10 mg total) by mouth at bedtime.   multivitamin with minerals tablet Take 1 tablet by mouth daily.   mupirocin ointment 2 % Commonly known as: BACTROBAN Apply 1 application topically daily. Until healed at bottom of right foot   mycophenolate 500 MG tablet Commonly known as: CELLCEPT Take 1,000 mg by mouth 2 (two) times daily.    Nucala 100 MG/ML [place] Generic drug: Mepolizumab Inject into the skin.   nystatin powder Commonly known as: MYCOSTATIN/NYSTOP Apply 1 application topically 3 (three) times daily.   nystatin-triamcinolone ointment Commonly known as: MYCOLOG Apply 1 application topically 2 (two) times daily.   ondansetron 4 MG tablet Commonly known as: Zofran Take 1 tablet (4 mg total) by mouth every 8 (eight) hours as needed for up to 10 doses for nausea or vomiting.   Pfizer-BioNTech COVID-19 Vacc 30 MCG/0.3ML injection Generic drug: COVID-19 mRNA vaccine (Pfizer) USE AS DIRECTED   Pfizer-BioNT COVID-19 Vac-TriS Susp injection Generic drug: COVID-19 mRNA Vac-TriS (Pfizer) Inject into the muscle.   predniSONE 10 MG tablet Commonly known as: DELTASONE Take 10 mg by mouth daily with breakfast.   rosuvastatin 20 MG tablet Commonly known as: Crestor Take one pill every other night before bed.   traMADol 50 MG tablet Commonly known as: ULTRAM Take by mouth every 6 (six) hours as needed.   triamcinolone ointment 0.1 % Commonly known as: KENALOG Apply 1 application topically 2 (two) times daily as needed. To affected rash/patch areas to leg for up to 1 week at a time        Allergies:  Allergies  Allergen Reactions   Enalapril Other (See Comments)   Vicodin [Hydrocodone-Acetaminophen] Hives and Rash    Severe headaches (also)    Family History: Family History  Problem Relation Age of Onset   Hypertension Father    Heart disease Father    Clotting disorder Mother    Kidney disease Brother    Heart attack Maternal Grandmother    Heart attack Maternal Grandfather    Heart attack Paternal Grandfather     Social History:  reports that he has never smoked. He has never used smokeless tobacco. He reports that he does not drink alcohol and does not use drugs.   Physical Exam: There were no vitals taken for this visit.  Constitutional:  Alert and oriented, No acute  distress. HEENT: [place] AT, moist mucus membranes.  Trachea midline, no masses. Cardiovascular: No clubbing, cyanosis, or edema. Respiratory: Normal respiratory effort, no increased work of breathing. Skin: No rashes, bruises or suspicious lesions. Neurologic: Grossly intact, no focal deficits, moving all 4 extremities. Psychiatric: Normal mood and affect.  Laboratory Data:  Lab Results  Component Value Date   CREATININE 1.32 (H) 08/22/2021    Lab Results  Component Value Date   PSA 1.0 07/28/2018   PSA 0.6 11/26/2016   PSA 0.4 07/14/2013    Lab Results  Component Value Date   HGBA1C 5.8 (H) 03/19/2021    Urinalysis   Pertinent Imaging:   Assessment & Plan:     No follow-ups on file.  I,Kailey Littlejohn,acting as a Education administrator for Hollice Espy, MD.,have documented  all relevant documentation on the behalf of Hollice Espy, MD,as directed by  Hollice Espy, MD while in the presence of Hollice Espy, MD.   [facility] [address], [place],  [address] [phone]

## 2021-09-10 ENCOUNTER — Ambulatory Visit: Payer: 59 | Admitting: Urology

## 2021-09-10 ENCOUNTER — Ambulatory Visit: Payer: 59 | Admitting: Family Medicine

## 2021-09-10 ENCOUNTER — Inpatient Hospital Stay: Payer: 59

## 2021-09-10 ENCOUNTER — Encounter: Payer: Self-pay | Admitting: Internal Medicine

## 2021-09-10 ENCOUNTER — Encounter: Payer: Self-pay | Admitting: Dermatology

## 2021-09-10 ENCOUNTER — Inpatient Hospital Stay
Admit: 2021-09-10 | Discharge: 2021-09-10 | Disposition: A | Discharge status: Home / Self Care | Payer: 59 | Attending: Internal Medicine | Admitting: Internal Medicine

## 2021-09-10 ENCOUNTER — Observation Stay
Admission: EM | Admit: 2021-09-10 | Discharge: 2021-09-11 | Disposition: A | Discharge status: Home / Self Care | Payer: 59 | Attending: Internal Medicine | Admitting: Internal Medicine

## 2021-09-10 DIAGNOSIS — Z7952 Long term (current) use of systemic steroids: Secondary | ICD-10-CM

## 2021-09-10 DIAGNOSIS — N189 Chronic kidney disease, unspecified: Secondary | ICD-10-CM | POA: Diagnosis not present

## 2021-09-10 DIAGNOSIS — Z86711 Personal history of pulmonary embolism: Secondary | ICD-10-CM | POA: Diagnosis present

## 2021-09-10 DIAGNOSIS — Z7901 Long term (current) use of anticoagulants: Secondary | ICD-10-CM

## 2021-09-10 DIAGNOSIS — R0602 Shortness of breath: Secondary | ICD-10-CM

## 2021-09-10 DIAGNOSIS — N179 Acute kidney failure, unspecified: Secondary | ICD-10-CM | POA: Diagnosis not present

## 2021-09-10 DIAGNOSIS — K529 Noninfective gastroenteritis and colitis, unspecified: Secondary | ICD-10-CM

## 2021-09-10 DIAGNOSIS — J449 Chronic obstructive pulmonary disease, unspecified: Secondary | ICD-10-CM | POA: Diagnosis present

## 2021-09-10 DIAGNOSIS — M329 Systemic lupus erythematosus, unspecified: Secondary | ICD-10-CM | POA: Diagnosis present

## 2021-09-10 DIAGNOSIS — I10 Essential (primary) hypertension: Secondary | ICD-10-CM | POA: Diagnosis present

## 2021-09-10 LAB — BASIC METABOLIC PANEL
Anion gap: 7 (ref 5–15)
BUN: 29 mg/dL — ABNORMAL HIGH (ref 6–20)
CO2: 21 mmol/L — ABNORMAL LOW (ref 22–32)
Calcium: 8.6 mg/dL — ABNORMAL LOW (ref 8.9–10.3)
Chloride: 110 mmol/L (ref 98–111)
Creatinine, Ser: 1.96 mg/dL — ABNORMAL HIGH (ref 0.61–1.24)
GFR, Estimated: 39 mL/min — ABNORMAL LOW (ref >60)
Glucose, Bld: 107 mg/dL — ABNORMAL HIGH (ref 70–99)
Potassium: 3.8 mmol/L (ref 3.5–5.1)
Sodium: 138 mmol/L (ref 135–145)

## 2021-09-10 LAB — C-REACTIVE PROTEIN: CRP: 0.8 mg/dL (ref <1.0)

## 2021-09-10 LAB — CBC
HCT: 36.7 % — ABNORMAL LOW (ref 39.0–52.0)
Hemoglobin: 11.8 g/dL — ABNORMAL LOW (ref 13.0–17.0)
MCH: 28.6 pg (ref 26.0–34.0)
MCHC: 32.2 g/dL (ref 30.0–36.0)
MCV: 88.9 fL (ref 80.0–100.0)
Platelets: 187 10*3/uL (ref 150–400)
RBC: 4.13 MIL/uL — ABNORMAL LOW (ref 4.22–5.81)
RDW: 15.4 % (ref 11.5–15.5)
WBC: 5.2 10*3/uL (ref 4.0–10.5)
nRBC: 0 % (ref 0.0–0.2)

## 2021-09-10 LAB — STREP PNEUMONIAE URINARY ANTIGEN: Strep Pneumo Urinary Antigen: NEGATIVE

## 2021-09-10 LAB — HIV ANTIBODY (ROUTINE TESTING W REFLEX): HIV Screen 4th Generation wRfx: NONREACTIVE

## 2021-09-10 LAB — BRAIN NATRIURETIC PEPTIDE: B Natriuretic Peptide: 38.8 pg/mL (ref 0.0–100.0)

## 2021-09-10 LAB — PROCALCITONIN: Procalcitonin: <0.1 ng/mL

## 2021-09-10 LAB — SEDIMENTATION RATE: Sed Rate: 25 mm/hr — ABNORMAL HIGH (ref 0–20)

## 2021-09-10 LAB — FERRITIN: Ferritin: 50 ng/mL (ref 24–336)

## 2021-09-10 MED ORDER — GABAPENTIN 300 MG PO CAPS
300.0000 mg | ORAL_CAPSULE | Freq: Three times a day (TID) | ORAL | Status: DC
  Administered 2021-09-10 – 2021-09-11 (×4): 300 mg via ORAL
  Filled 2021-09-10 (×4): qty 1

## 2021-09-10 MED ORDER — SODIUM CHLORIDE 0.9 % IV SOLN: INTRAVENOUS | Status: DC

## 2021-09-10 MED ORDER — SODIUM CHLORIDE 0.9 % IV BOLUS (SEPSIS)
1000.0000 mL | Freq: Once | INTRAVENOUS | Status: AC
  Administered 2021-09-10: 1000 mL via INTRAVENOUS

## 2021-09-10 MED ORDER — MONTELUKAST SODIUM 10 MG PO TABS
10.0000 mg | ORAL_TABLET | Freq: Every day | ORAL | Status: DC
  Administered 2021-09-10: 10 mg via ORAL
  Filled 2021-09-10: qty 1

## 2021-09-10 MED ORDER — ONDANSETRON HCL 4 MG/2ML IJ SOLN: 4.0000 mg | Freq: Four times a day (QID) | INTRAMUSCULAR | Status: DC | PRN

## 2021-09-10 MED ORDER — PREDNISONE 10 MG PO TABS
10.0000 mg | ORAL_TABLET | Freq: Every day | ORAL | Status: DC
  Administered 2021-09-10 – 2021-09-11 (×2): 10 mg via ORAL
  Filled 2021-09-10 (×2): qty 1

## 2021-09-10 MED ORDER — ONDANSETRON HCL 4 MG PO TABS: 4.0000 mg | ORAL_TABLET | Freq: Four times a day (QID) | ORAL | Status: DC | PRN

## 2021-09-10 MED ORDER — APIXABAN 5 MG PO TABS
5.0000 mg | ORAL_TABLET | Freq: Two times a day (BID) | ORAL | Status: DC
  Administered 2021-09-10 – 2021-09-11 (×3): 5 mg via ORAL
  Filled 2021-09-10 (×3): qty 1

## 2021-09-10 MED ORDER — SULFAMETHOXAZOLE-TRIMETHOPRIM 400-80 MG/5ML IV SOLN
320.0000 mg | Freq: Two times a day (BID) | INTRAVENOUS | Status: DC
  Administered 2021-09-10: 320 mg via INTRAVENOUS
  Filled 2021-09-10 (×3): qty 20

## 2021-09-10 MED ORDER — IPRATROPIUM-ALBUTEROL 0.5-2.5 (3) MG/3ML IN SOLN: 3.0000 mL | RESPIRATORY_TRACT | Status: DC | PRN

## 2021-09-10 MED ORDER — ROSUVASTATIN CALCIUM 5 MG PO TABS
5.0000 mg | ORAL_TABLET | Freq: Every day | ORAL | Status: DC
  Administered 2021-09-10: 5 mg via ORAL
  Filled 2021-09-10 (×2): qty 1

## 2021-09-10 MED ORDER — MYCOPHENOLATE MOFETIL 250 MG PO CAPS
1000.0000 mg | ORAL_CAPSULE | Freq: Two times a day (BID) | ORAL | Status: DC
  Administered 2021-09-10 – 2021-09-11 (×3): 1000 mg via ORAL
  Filled 2021-09-10 (×5): qty 4

## 2021-09-10 MED ORDER — APIXABAN 5 MG PO TABS
5.0000 mg | ORAL_TABLET | ORAL | Status: AC
  Administered 2021-09-10: 5 mg via ORAL
  Filled 2021-09-10: qty 1

## 2021-09-10 MED ORDER — TECHNETIUM TO 99M ALBUMIN AGGREGATED
4.0000 | Freq: Once | INTRAVENOUS | Status: AC | PRN
  Administered 2021-09-10: 3.93 via INTRAVENOUS
  Filled 2021-09-10: qty 4

## 2021-09-10 MED ORDER — PANTOPRAZOLE SODIUM 40 MG IV SOLR
40.0000 mg | INTRAVENOUS | Status: DC
  Administered 2021-09-10: 40 mg via INTRAVENOUS
  Filled 2021-09-10: qty 40

## 2021-09-10 MED ORDER — ALBUTEROL SULFATE HFA 108 (90 BASE) MCG/ACT IN AERS: 2.0000 | INHALATION_SPRAY | Freq: Four times a day (QID) | RESPIRATORY_TRACT | Status: DC | PRN

## 2021-09-10 MED ORDER — IPRATROPIUM-ALBUTEROL 0.5-2.5 (3) MG/3ML IN SOLN
3.0000 mL | Freq: Once | RESPIRATORY_TRACT | Status: AC
  Administered 2021-09-10: 3 mL via RESPIRATORY_TRACT
  Filled 2021-09-10: qty 3

## 2021-09-10 MED ORDER — HYDROCODONE-ACETAMINOPHEN 10-325 MG PO TABS
1.0000 | ORAL_TABLET | Freq: Four times a day (QID) | ORAL | Status: DC | PRN
Start: 2021-09-10 — End: 2021-09-11
  Administered 2021-09-10: 1 via ORAL
  Filled 2021-09-10: qty 1

## 2021-09-10 MED ORDER — AMLODIPINE BESYLATE 5 MG PO TABS
5.0000 mg | ORAL_TABLET | Freq: Every day | ORAL | Status: DC
  Administered 2021-09-10 – 2021-09-11 (×2): 5 mg via ORAL
  Filled 2021-09-10 (×2): qty 1

## 2021-09-10 NOTE — H&P (Signed)
History and Physical    Jonathon Snow GNF:[id number] DOB: [birth year] DOA: 09/10/2021  PCP: Delsa Grana, PA-C   Patient coming from: home  I have personally briefly reviewed patient's old medical records in [place]  Chief Complaint: Nausea, diarrhea, chills, shortness of breath  HPI: Jonathon Snow is a 60 y.o. male with medical history significant for COPD, SLE on CellCept and prednisone, history of DVT and PE on Eliquis, CKD 3a who presents to the ED with a 3-day history of nausea and diarrhea.  He denies abdominal pain and denies fever but has been having chills.  Patient also has a complaint of shortness of breath with occasional cough for which she was seen in the ED on 9/1 and had an unremarkable work-up which included a negative CTA chest.  He was diagnosed with acute viral bronchitis at the time.  COVID was negative.  Due to persistent symptoms and new diarrhea returns to the ED for evaluation.  ED course: On arrival afebrile, BP 159/96 with pulse of 85 and respirations 26 with O2 sat 100% on room air CBC WNL, CMP with creatinine 2.41 above baseline of 1.32.  Troponin 5/6 COVID and flu negative  EKG, personally viewed and interpreted: NSR at 84 with no acute ST-T wave changes  Imaging: Chest x-ray stable appearance of the chest compared to prior exam  Patient given IV hydration.  Hospitalist consulted for admission for AKI in the setting of diarrhea and ongoing shortness of breath  Review of Systems: As per HPI otherwise all other systems on review of systems negative.    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   Presence of IVC filter 03/22/2020   Pulmonary embolism ([place])    Renal disorder    Stage III    Past Surgical History:  Procedure Laterality Date   ANKLE SURGERY Right    COLONOSCOPY WITH PROPOFOL N/A 05/28/2020   Procedure: COLONOSCOPY WITH PROPOFOL;  Surgeon: Jonathon Bellows, MD;  Location: [facility] ENDOSCOPY;  Service: Endoscopy;  Laterality: N/A;   CYST EXCISION  92 or 93    Liver cyst removal [facility]   I & D EXTREMITY Right 04/29/2017   Procedure: IRRIGATION AND DEBRIDEMENT EXTREMITY;  Surgeon: Clayburn Pert, MD;  Location: [facility] ORS;  Service: General;  Laterality: Right;   IRRIGATION AND DEBRIDEMENT ABSCESS Left 04/29/2017   Procedure: IRRIGATION AND DEBRIDEMENT Scrotal ABSCESS;  Surgeon: Clayburn Pert, MD;  Location: [facility] ORS;  Service: General;  Laterality: Left;     reports that he has never smoked. He has never used smokeless tobacco. He reports that he does not drink alcohol and does not use drugs.  Allergies  Allergen Reactions   Enalapril Other (See Comments)   Vicodin [Hydrocodone-Acetaminophen] Hives and Rash    Severe headaches (also)    Family History  Problem Relation Age of Onset   Hypertension Father  Heart disease Father    Clotting disorder Mother    Kidney disease Brother    Heart attack Maternal Grandmother    Heart attack Maternal Grandfather    Heart attack Paternal Grandfather       Prior to Admission medications   Medication Sig Start Date End Date Taking? Authorizing Provider  albuterol (VENTOLIN HFA) 108 (90 Base) MCG/ACT inhaler Inhale 2 puffs into the lungs every 6 (six) hours as needed for wheezing or shortness of breath. 01/17/20  Yes Delsa Grana, PA-C  amLODipine (NORVASC) 5 MG tablet Take 1 tablet (5 mg total) by mouth daily. 11/07/20   Yes Delsa Grana, PA-C  apixaban (ELIQUIS) 5 MG TABS tablet Take 1 tablet (5 mg total) by mouth 2 (two) times daily. 01/17/20  Yes Delsa Grana, PA-C  cholecalciferol (VITAMIN D3) 25 MCG (1000 UNIT) tablet Take 1,000 Units by mouth daily.   Yes [provider]  gabapentin (NEURONTIN) 300 MG capsule Take 300 mg by mouth 3 (three) times daily.   Yes [provider]  HYDROcodone-acetaminophen [facility]) 10-325 MG tablet  06/18/21  Yes [provider]  hydrOXYzine (ATARAX/VISTARIL) 10 MG tablet Take 1-2 tablets (10-20 mg total) by mouth 3 (three) times daily as needed. 08/30/21  Yes Sowles, Drue Stager, MD  ipratropium-albuterol (DUONEB) 0.5-2.5 (3) MG/3ML SOLN Take 3 mLs by nebulization every 4 (four) hours as needed. 01/17/20  Yes Delsa Grana, PA-C  loratadine (CLARITIN) 10 MG tablet Take 1 tablet (10 mg total) by mouth daily. 11/07/20  Yes Delsa Grana, PA-C  losartan (COZAAR) 100 MG tablet Take 1 tablet (100 mg total) by mouth daily. 11/07/20  Yes Delsa Grana, PA-C  Mepolizumab (NUCALA) 100 MG/ML SOAJ Inject into the skin. 05/02/20  Yes [provider]  montelukast (SINGULAIR) 10 MG tablet Take 1 tablet (10 mg total) by mouth at bedtime. 11/07/20  Yes Delsa Grana, PA-C  Multiple Vitamins-Minerals (MULTIVITAMIN WITH MINERALS) tablet Take 1 tablet by mouth daily.   Yes [provider]  mupirocin ointment (BACTROBAN) 2 % Apply 1 application topically daily. Until healed at bottom of right foot 09/09/21  Yes Ralene Bathe, MD  mycophenolate (CELLCEPT) 500 MG tablet Take 1,000 mg by mouth 2 (two) times daily.   Yes [provider]  nystatin (MYCOSTATIN/NYSTOP) powder Apply 1 application topically 3 (three) times daily. 08/08/21  Yes Kathrine Haddock, NP  nystatin-triamcinolone ointment (MYCOLOG) Apply 1 application topically 2 (two) times daily. 08/08/21  Yes Kathrine Haddock, NP  ondansetron (ZOFRAN) 4 MG tablet Take 1 tablet (4 mg total) by mouth every 8 (eight)  hours as needed for up to 10 doses for nausea or vomiting. 08/22/21  Yes Lucrezia Starch, MD  predniSONE (DELTASONE) 10 MG tablet Take 10 mg by mouth daily with breakfast.   Yes Dagoberto Ligas, MD  rosuvastatin (CRESTOR) 20 MG tablet Take one pill every other night before bed. 03/19/21  Yes Carles Collet M, PA-C  sulfamethoxazole-trimethoprim (BACTRIM) 400-80 MG tablet Take 2 tablets by mouth 2 (two) times daily. 09/04/21  Yes [provider]  triamcinolone ointment (KENALOG) 0.1 % Apply 1 application topically 2 (two) times daily as needed. To affected rash/patch areas to leg for up to 1 week at a time 01/17/20  Yes [name], [name], PA-C  COVID-19 mRNA Vac-TriS, Pfizer, (PFIZER-BIONT COVID-19 VAC-TRIS) SUSP injection Inject into the muscle. 08/13/21   Carlyle Basques, MD  COVID-19 mRNA vaccine, [place], 30 MCG/0.3ML injection USE AS DIRECTED 11/16/20 11/16/21  Carlyle Basques, MD  gabapentin (NEURONTIN) 100 MG  capsule Take 100 mg by mouth 2 (two) times daily. Patient not taking: No sig reported    [provider]  traMADol (ULTRAM) 50 MG tablet Take by mouth every 6 (six) hours as needed. Patient not taking: No sig reported    [provider]    Physical Exam: Vitals:   09/09/21 1729 09/09/21 1731 09/09/21 2219 09/10/21 0203  BP: (!) 159/96  (!) 160/91 (!) 171/85  Pulse: 85  86 89  Resp: (!) 26  18 14   Temp: 97.9 F (36.6 C)  98 F (36.7 C) 99.6 F (37.6 C)  TempSrc: Oral  Oral Oral  SpO2: 100%  94% 100%  Weight:  108.4 kg    Height:  6\' 3"  (1.905 m)       Vitals:   09/09/21 1729 09/09/21 1731 09/09/21 2219 09/10/21 0203  BP: (!) 159/96  (!) 160/91 (!) 171/85  Pulse: 85  86 89  Resp: (!) 26  18 14   Temp: 97.9 F (36.6 C)  98 F (36.7 C) 99.6 F (37.6 C)  TempSrc: Oral  Oral Oral  SpO2: 100%  94% 100%  Weight:  108.4 kg    Height:  6\' 3"  (1.905 m)        Constitutional: Chronically ill-appearing, mild trauma oriented x 3 . Not in any apparent  distress HEENT:      Head: Normocephalic and atraumatic.         Eyes: PERLA, EOMI, Conjunctivae are normal. Sclera is non-icteric.       Mouth/Throat: Mucous membranes are moist.       Neck: Supple with no signs of meningismus. Cardiovascular: Regular rate and rhythm. No murmurs, gallops, or rubs. 2+ symmetrical distal pulses are present . No JVD. No LE edema Respiratory: Respiratory effort normal .Lungs sounds clear bilaterally. No wheezes, crackles, or rhonchi.  Gastrointestinal: Soft, non tender, and non distended with positive bowel sounds.  Genitourinary: No CVA tenderness. Musculoskeletal: Nontender with normal range of motion in all extremities. No cyanosis, or erythema of extremities. Neurologic:  Face is symmetric. Moving all extremities. No gross focal neurologic deficits . Skin: Skin is warm, dry.  No rash or ulcers Psychiatric: Mood and affect are normal    Labs on Admission: I have personally reviewed following labs and imaging studies  CBC: Recent Labs  Lab 09/09/21 1734  WBC 7.5  NEUTROABS 5.1  HGB 13.6  HCT 42.2  MCV 89.6  PLT 476   Basic Metabolic Panel: Recent Labs  Lab 09/09/21 1734  NA 137  K 4.5  CL 107  CO2 21*  GLUCOSE 100*  BUN 32*  CREATININE 2.41*  CALCIUM 9.5   GFR: Estimated Creatinine Clearance: 43.9 mL/min (A) (by C-G formula based on SCr of 2.41 mg/dL (H)). Liver Function Tests: Recent Labs  Lab 09/09/21 1734  AST 21  ALT 19  ALKPHOS 93  BILITOT 0.8  PROT 7.7  ALBUMIN 3.9   No results for input(s): LIPASE, AMYLASE in the last 168 hours. No results for input(s): AMMONIA in the last 168 hours. Coagulation Profile: No results for input(s): INR, PROTIME in the last 168 hours. Cardiac Enzymes: No results for input(s): CKTOTAL, CKMB, CKMBINDEX, TROPONINI in the last 168 hours. BNP (last 3 results) No results for input(s): PROBNP in the last 8760 hours. HbA1C: No results for input(s): HGBA1C in the last 72 hours. CBG: No  results for input(s): GLUCAP in the last 168 hours. Lipid Profile: No results for input(s): CHOL, HDL, LDLCALC, TRIG, CHOLHDL,  LDLDIRECT in the last 72 hours. Thyroid Function Tests: No results for input(s): TSH, T4TOTAL, FREET4, T3FREE, THYROIDAB in the last 72 hours. Anemia Panel: No results for input(s): VITAMINB12, FOLATE, FERRITIN, TIBC, IRON, RETICCTPCT in the last 72 hours. Urine analysis:    Component Value Date/Time   COLORURINE YELLOW 03/19/2021 0855   APPEARANCEUR CLEAR 03/19/2021 0855   APPEARANCEUR Clear 05/21/2017 1046   LABSPEC 1.014 03/19/2021 0855   LABSPEC 1.013 04/10/2015 1036   PHURINE < OR = 5.0 03/19/2021 0855   GLUCOSEU NEGATIVE 03/19/2021 0855   GLUCOSEU Negative 04/10/2015 1036   HGBUR NEGATIVE 03/19/2021 0855   BILIRUBINUR neg 07/23/2021 1423   BILIRUBINUR Negative 05/21/2017 1046   BILIRUBINUR Negative 04/10/2015 1036   KETONESUR NEGATIVE 03/19/2021 0855   PROTEINUR Negative 07/23/2021 1423   PROTEINUR 1+ (A) 03/19/2021 0855   UROBILINOGEN 0.2 07/23/2021 1423   UROBILINOGEN 0.2 03/17/2015 0025   NITRITE neg 07/23/2021 1423   NITRITE NEGATIVE 03/19/2021 0855   LEUKOCYTESUR Negative 07/23/2021 1423   LEUKOCYTESUR NEGATIVE 03/19/2021 0855   LEUKOCYTESUR Negative 04/10/2015 1036    Radiological Exams on Admission: DG Chest 2 View  Result Date: 09/09/2021 CLINICAL DATA:  Shortness of breath and difficulty swallowing, initial encounter EXAM: CHEST - 2 VIEW COMPARISON:  08/22/2021 FINDINGS: Cardiac shadow is stable. Lungs are well aerated bilaterally. No focal infiltrate or sizable effusion is seen. Chronic bronchitic markings are noted in the bases stable from the previous exam. No bony abnormality is seen. IMPRESSION: Stable appearance of the chest when compared with the prior exam. No acute abnormality noted. Electronically Signed   By: Inez Catalina M.D.   On: 09/09/2021 18:27     Assessment/Plan 60 year old male with history of COPD, SLE on CellCept  and prednisone, history of DVT and PE on Eliquis, CKD 3a who presents to the ED with a 3-day history of nausea and diarrhea and was preceded by a 3-week history of shortness of breath.     Acute kidney injury superimposed on CKD llla ([place])   Acute gastroenteritis - Patient with 3-day history of nausea and nonbloody diarrhea, with creatinine 2.41 up from baseline of 1.3 - COVID and flu negative - IV hydration  Shortness of breath x3 weeks  History of pulmonary embolism on Eliquis - Patient on Eliquis for history of PE and DVT and had a negative CTA chest for similar symptoms on 9/1 -Chest x-ray nonacute - Patient compliant with Eliquis - Continue to monitor - Patient has seen pulmonology in the past - Consider pulmonology evaluation    COPD, moderate ([place]) - Does not appear acutely exacerbated - Albuterol as needed    Systemic lupus erythematosus ([place])   Long term current use of immunosuppressive meds - Continue CellCept and prednisone    Benign essential hypertension - Continue amlodipine     DVT prophylaxis: Eliquis Code Status: full code  Family Communication:  none  Disposition Plan: Back to previous home environment Consults called: none  Status:At the time of admission, it appears that the appropriate admission status for this patient is INPATIENT. This is judged to be reasonable and necessary in order to provide the required intensity of service to ensure the patient's safety given the presenting symptoms, physical exam findings, and initial radiographic and laboratory data in the context of their  Comorbid conditions.   Patient requires inpatient status due to high intensity of service, high risk for further deterioration and high frequency of surveillance required.   I certify that at the point of  admission it is my clinical judgment that the patient will require inpatient hospital care spanning beyond [place] MD [facility]     09/10/2021, 3:49 AM

## 2021-09-10 NOTE — ED Provider Notes (Addendum)
[facility] Emergency Department Provider Note  ____________________________________________   Event Date/Time   First MD Initiated Contact with Patient 09/10/21 [phone]     (approximate)  I have reviewed the triage vital signs and the nursing notes.   HISTORY  Chief Complaint Shortness of Breath    HPI Jonathon Snow is a 60 y.o. male with history of COPD, lupus on CellCept and prednisone, previous history of PE and DVT on Eliquis, chronic kidney disease who presents to the emergency department with increasing shortness of breath for the past 3 weeks.  Was seen in the emergency department on September 1 for similar symptoms and had an unremarkable work-up.  States that he has also been seen by pulmonology but states symptoms continue to progress.  He is not on oxygen at home.  He denies any fever but states he has had a productive cough for the past 2 days.  Has had nausea but no vomiting.  Has had diarrhea for 3 days.  No abdominal pain.  No lower extremity swelling or pain.  Has not missed any of his Eliquis.        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   Overview:  superior sagittal sinus, left transverse sinus and cortical veins    COVID-19 virus infection 12/2020   Depression    DVT (deep venous thrombosis) (HCC)    GERD (gastroesophageal reflux disease)    Heel spur, left 02/16/2019   Heel spur, right 02/16/2019   Hyperlipidemia    Hypertension    Lupus ([place])    Lymphedema 10/07/2018   Morbid obesity ([place])    Opiate abuse, episodic ([place]) 02/26/2018   Osteoporosis    Postphlebitic syndrome with ulcer, left ([place]) 11/18/2016   Presence of IVC filter 03/22/2020   Pulmonary embolism ([place])    Renal disorder    Stage III    Patient Active Problem List   Diagnosis Date Noted   Hypertension 06/14/2021   Morbid obesity ([place])    COVID-19 virus infection 12/2020   Prediabetes  11/08/2020   Stage 3a chronic kidney disease ([place]) 11/08/2020   Seasonal allergies 11/08/2020   Rhinosinusitis 11/08/2020   Long term current use of systemic steroids 11/08/2020   Anticoagulant disorder ([place]) 11/07/2020   Senile purpura ([place]) 11/07/2020   History of DVT (deep vein thrombosis) 03/22/2020   Herpes zoster infection of lumbar region 02/20/2020   Severe episode of recurrent major depressive disorder, without psychotic features ([place]) 09/12/2019   Other spondylosis with radiculopathy, lumbar region 02/16/2019   Osteoporosis 10/12/2018   Chronic venous insufficiency 10/07/2018   SLE glomerulonephritis syndrome, 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[place]) 03/03/2018   Syncope 12/29/2017   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 ([place]) 10/29/2016   Mixed hyperlipidemia 01/15/2016   Benign essential hypertension 01/15/2016   Obesity (BMI 30.0-34.9) 01/15/2016   Long term current use of anticoagulant 10/04/2015   Systemic lupus erythematosus ([place]) 05/30/2015   Pulmonary embolism ([place]) 05/30/2015   COPD, moderate ([place]) 06/27/2014   History of pulmonary embolism 12/11/2013   Nodule of right lung 12/11/2013   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   Cystic disease of liver 08/21/2013    Past Surgical History:  Procedure Laterality Date   ANKLE SURGERY Right    COLONOSCOPY WITH PROPOFOL N/A 05/28/2020   Procedure: COLONOSCOPY WITH PROPOFOL;  Surgeon: Jonathon Bellows, MD;  Location: [facility] ENDOSCOPY;  Service: Endoscopy;  Laterality: N/A;   CYST EXCISION  92 or 93    Liver cyst removal [facility]   I & D EXTREMITY Right 04/29/2017   Procedure: IRRIGATION AND DEBRIDEMENT EXTREMITY;  Surgeon: Clayburn Pert, MD;  Location: [facility] ORS;  Service: General;  Laterality: Right;   IRRIGATION AND DEBRIDEMENT ABSCESS Left 04/29/2017   Procedure: IRRIGATION AND DEBRIDEMENT Scrotal ABSCESS;  Surgeon: Clayburn Pert, MD;  Location: [facility] ORS;  Service: General;  Laterality: Left;     Prior to Admission medications   Medication Sig Start Date End Date Taking? Authorizing Provider  albuterol (VENTOLIN HFA) 108 (90 Base) MCG/ACT inhaler Inhale 2 puffs into the lungs every 6 (six) hours as needed for wheezing or shortness of breath. 01/17/20   Delsa Grana, PA-C  amLODipine (NORVASC) 5 MG tablet Take 1 tablet (5 mg total) by mouth daily. 11/07/20   Delsa Grana, PA-C  apixaban (ELIQUIS) 5 MG TABS tablet Take 1 tablet (5 mg total) by mouth 2 (two) times daily. 01/17/20   Delsa Grana, PA-C  COVID-19 mRNA Vac-TriS, Pfizer, (PFIZER-BIONT COVID-19 VAC-TRIS) SUSP injection Inject into the muscle. 08/13/21   Carlyle Basques, MD  COVID-19 mRNA vaccine, [place], 30 MCG/0.3ML injection USE AS DIRECTED 11/16/20 11/16/21  Carlyle Basques, MD  gabapentin (NEURONTIN) 100 MG capsule Take 100 mg by mouth 2 (two) times daily.    [provider]  gabapentin (NEURONTIN) 300 MG capsule Take by mouth.    [provider]  HYDROcodone-acetaminophen [facility]) 10-325 MG tablet  06/18/21   [provider]  hydrOXYzine (ATARAX/VISTARIL) 10 MG tablet Take 1-2 tablets (10-20 mg total) by mouth 3 (three) times daily as needed. 08/30/21   Steele Sizer, MD  ipratropium-albuterol (DUONEB) 0.5-2.5 (3) MG/3ML SOLN Take 3 mLs by nebulization every 4 (four) hours as needed. 01/17/20   Delsa Grana, PA-C  loratadine (CLARITIN) 10 MG tablet Take 1 tablet (10 mg total) by mouth daily. 11/07/20   Delsa Grana, PA-C  losartan (COZAAR) 100 MG tablet Take 1 tablet (100 mg total) by mouth daily. 11/07/20   Delsa Grana, PA-C  Mepolizumab (NUCALA) 100 MG/ML SOAJ Inject into the skin. 05/02/20   [provider]  montelukast (SINGULAIR) 10 MG tablet Take 1 tablet (10 mg total) by mouth at bedtime. 11/07/20   Delsa Grana, PA-C  Multiple Vitamins-Minerals (MULTIVITAMIN WITH MINERALS) tablet Take 1 tablet by mouth daily.    [provider]  mupirocin ointment (BACTROBAN) 2 % Apply 1  application topically daily. Until healed at bottom of right foot 09/09/21   Ralene Bathe, MD  mycophenolate (CELLCEPT) 500 MG tablet Take 1,000 mg by mouth 2 (two) times daily.    [provider]  nystatin (MYCOSTATIN/NYSTOP) powder Apply 1 application topically 3 (three) times daily. 08/08/21   Kathrine Haddock, NP  nystatin-triamcinolone ointment [facility]) Apply 1 application topically 2 (two) times daily. 08/08/21   Kathrine Haddock, NP  ondansetron (ZOFRAN) 4 MG tablet Take 1 tablet (4 mg total) by mouth every 8 (eight) hours as needed for up to 10 doses for nausea or vomiting. 08/22/21   Lucrezia Starch, MD  predniSONE (DELTASONE) 10 MG tablet Take 10 mg by mouth daily with breakfast.    Dagoberto Ligas, MD  rosuvastatin (CRESTOR) 20 MG tablet Take one pill every other night before bed. 03/19/21   Trinna Post, PA-C  traMADol (ULTRAM) 50 MG tablet Take by mouth every 6 (six) hours as needed.    [provider]  triamcinolone ointment (KENALOG) 0.1 %  Apply 1 application topically 2 (two) times daily as needed. To affected rash/patch areas to leg for up to 1 week at a time 01/17/20   Delsa Grana, PA-C    Allergies Enalapril and Vicodin [hydrocodone-acetaminophen]  Family History  Problem Relation Age of Onset   Hypertension Father    Heart disease Father    Clotting disorder Mother    Kidney disease Brother    Heart attack Maternal Grandmother    Heart attack Maternal Grandfather    Heart attack Paternal Grandfather     Social History Social History   Tobacco Use   Smoking status: Never   Smokeless tobacco: Never  Vaping Use   Vaping Use: Never used  Substance Use Topics   Alcohol use: No   Drug use: No    Review of Systems Constitutional: No fever. Eyes: No visual changes. ENT: No sore throat. Cardiovascular: Denies chest pain. Respiratory: +shortness of breath. Gastrointestinal: + nausea,  diarrhea. Genitourinary: Negative for  dysuria. Musculoskeletal: Negative for back pain. Skin: Negative for rash. Neurological: Negative for focal weakness or numbness.  ____________________________________________   PHYSICAL EXAM:  VITAL SIGNS: ED Triage Vitals  Enc Vitals Group     BP 09/09/21 1729 (!) 159/96     Pulse Rate 09/09/21 1729 85     Resp 09/09/21 1729 (!) 26     Temp 09/09/21 1729 97.9 F (36.6 C)     Temp Source 09/09/21 1729 Oral     SpO2 09/09/21 1729 100 %     Weight 09/09/21 1731 239 lb (108.4 kg)     Height 09/09/21 1731 6\' 3"  (1.905 m)     Head Circumference --      Peak Flow --      Pain Score 09/09/21 1731 0     Pain Loc --      Pain Edu? --      Excl. in [place]? --    CONSTITUTIONAL: Alert and oriented and responds appropriately to questions.  Appears dyspneic HEAD: Normocephalic EYES: Conjunctivae clear, pupils appear equal, EOM appear intact ENT: normal nose; moist mucous membranes NECK: Supple, normal ROM CARD: RRR; S1 and S2 appreciated; no murmurs, no clicks, no rubs, no gallops RESP: Patient is tachypneic and speaking truncated sentences.  Minimally diminished aeration at his bases bilaterally but there are no rhonchi, wheezing or rales appreciated.  He is not hypoxic on room air. ABD/GI: Normal bowel sounds; non-distended; soft, non-tender, no rebound, no guarding, no peritoneal signs, no hepatosplenomegaly BACK: The back appears normal EXT: Normal ROM in all joints; no deformity noted, no edema; no cyanosis, no calf tenderness or calf swelling SKIN: Normal color for age and race; warm; no rash on exposed skin NEURO: Moves all extremities equally PSYCH: The patient's mood and manner are appropriate.  ____________________________________________   LABS (all labs ordered are listed, but only abnormal results are displayed)  Labs Reviewed  COMPREHENSIVE METABOLIC PANEL - Abnormal; Notable for the following components:      Result Value   CO2 21 (*)    Glucose, Bld 100 (*)    BUN  32 (*)    Creatinine, Ser 2.41 (*)    GFR, Estimated 30 (*)    All other components within normal limits  CBC WITH DIFFERENTIAL/PLATELET - Abnormal; Notable for the following components:   Monocytes Absolute 1.1 (*)    All other components within normal limits  RESP PANEL BY RT-PCR (FLU A&B, COVID) ARPGX2  TROPONIN I (HIGH SENSITIVITY)  TROPONIN I (  HIGH SENSITIVITY)   ____________________________________________  EKG   EKG Interpretation  Date/Time:  Monday September 09 2021 17:45:49 EDT Ventricular Rate:  84 PR Interval:  144 QRS Duration: 78 QT Interval:  354 QTC Calculation: 418 R Axis:   45 Text Interpretation: Normal sinus rhythm Normal ECG Confirmed by Pryor Curia [phone]) on 09/10/2021 1:21:05 AM        ____________________________________________  RADIOLOGY Jessie Foot Bristyn Kulesza, personally viewed and evaluated these images (plain radiographs) as part of my medical decision making, as well as reviewing the written report by the radiologist.  ED MD interpretation: Chest x-ray clear.  Official radiology report(s): DG Chest 2 View  Result Date: 09/09/2021 CLINICAL DATA:  Shortness of breath and difficulty swallowing, initial encounter EXAM: CHEST - 2 VIEW COMPARISON:  08/22/2021 FINDINGS: Cardiac shadow is stable. Lungs are well aerated bilaterally. No focal infiltrate or sizable effusion is seen. Chronic bronchitic markings are noted in the bases stable from the previous exam. No bony abnormality is seen. IMPRESSION: Stable appearance of the chest when compared with the prior exam. No acute abnormality noted. Electronically Signed   By: Inez Catalina M.D.   On: 09/09/2021 18:27    ____________________________________________   PROCEDURES  Procedure(s) performed (including Critical Care):  Procedures    ____________________________________________   INITIAL IMPRESSION / ASSESSMENT AND PLAN / ED COURSE  As part of my medical decision making, I reviewed the  following data within the [facility] notes reviewed and incorporated, Labs reviewed , EKG interpreted , Old EKG reviewed, Old chart reviewed, Radiograph reviewed , Discussed with admitting physician , and Notes from prior ED visits         Patient here with increasing shortness of breath for 3 weeks.  He does appear very dyspneic on exam but is not hypoxic.  His chest x-ray is clear.  He did have a CT dissection study that did not show any dissection on September 1 and did not show any pulmonary embolism to the segmental level of the pulmonary arteries.  He reports compliance with his Eliquis with no missed doses.  Unclear etiology for his shortness of breath.  I think he would likely need a VQ scan to completely evaluate for PE.  Unable to get CT chest with contrast due to his chronic kidney disease.  Will give home dose of Eliquis here.  He denies any chest pain.  Troponin negative.  No sign of infiltrate, edema, pneumothorax on chest x-ray.  Patient also has acute kidney injury superimposed on chronic kidney disease.  States he has been very nauseated and having diarrhea for several days with decreased oral intake.  We will hydrate patient.  This also could be due to his lupus.  Will discuss with hospitalist for admission for worsening renal function.  Electrolytes stable.  Ct was 1.3 on 08/22/21/.   ED PROGRESS  1:47 AM Discussed patient's case with hospitalist, Dr. Damita Dunnings.  I have recommended admission and patient (and family if present) agree with this plan. Admitting physician will place admission orders.   I reviewed all nursing notes, vitals, pertinent previous records and reviewed/interpreted all EKGs, lab and urine results, imaging (as available).  ____________________________________________   FINAL CLINICAL IMPRESSION(S) / ED DIAGNOSES  Final diagnoses:  Shortness of breath  Acute kidney injury superimposed on chronic kidney disease [facility])     ED  Discharge Orders     None       *Please note:  HIEU HERMS was evaluated in  Emergency Department on 09/10/2021 for the symptoms described in the history of present illness. He was evaluated in the context of the global COVID-19 pandemic, which necessitated consideration that the patient might be at risk for infection with the SARS-CoV-2 virus that causes COVID-19. Institutional protocols and algorithms that pertain to the evaluation of patients at risk for COVID-19 are in a state of rapid change based on information released by regulatory bodies including the CDC and federal and state organizations. These policies and algorithms were followed during the patient's care in the ED.  Some ED evaluations and interventions may be delayed as a result of limited staffing during and the pandemic.*   Note:  This document was prepared using Dragon voice recognition software and may include unintentional dictation errors.    Kylynn Street, Delice Bison, DO 09/10/21 0147    Clinton Wahlberg, Delice Bison, DO 09/10/21 [phone]

## 2021-09-10 NOTE — ED Notes (Signed)
Pt changed into hospital gown

## 2021-09-10 NOTE — Consult Note (Addendum)
Pulmonary Medicine          Date: 09/10/2021,   MRN# [id number] Jonathon Snow [birth year]     AdmissionWeight: 108.4 kg                 CurrentWeight: 108.4 kg   Referring physician: Dr Mal Misty   CHIEF COMPLAINT:   Acute respiratory distress with dyspnea at rest and diarreah   HISTORY OF PRESENT ILLNESS   This is a 60 yo M with hx of SLE on Cellcept, emphysema with COPD on chronic prednisone, CKD, hx of DVT/PE came in with NVD x 3d as well as DOE/SOB at rest and cough.  He was seen in ER 3 wks ago for similar complaints and had CT chest done with findings suggestive of sub acute viral bronchitis, had COVID testing which was negative. CT chest reviewed by my with no PE noted, absence of infiltrate/consolidation/effusion/edema but does show tram tracking bilaterally suggestive of reactive bronchitic changes.  His vital signs were WNL and CMP unchanged from previous. PCCM consultation for additional evaluation and management.    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   Overview:  superior sagittal sinus, left transverse sinus and cortical veins    COVID-19 virus infection 12/2020   Depression    DVT (deep venous thrombosis) (HCC)    GERD (gastroesophageal reflux disease)    Heel spur, left 02/16/2019   Heel spur, right 02/16/2019   Hyperlipidemia    Hypertension    Lupus ([place])    Lymphedema 10/07/2018   Morbid obesity ([place])    Opiate abuse, episodic ([place]) 02/26/2018   Osteoporosis    Postphlebitic syndrome with ulcer, 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 ENDOSCOPY;   Service: Endoscopy;  Laterality: N/A;   CYST EXCISION  92 or 93    Liver cyst removal [facility]   I & D EXTREMITY Right 04/29/2017   Procedure: IRRIGATION AND DEBRIDEMENT EXTREMITY;  Surgeon: Clayburn Pert, MD;  Location: [facility] ORS;  Service: General;  Laterality: Right;   IRRIGATION AND DEBRIDEMENT ABSCESS Left 04/29/2017   Procedure: IRRIGATION AND DEBRIDEMENT Scrotal ABSCESS;  Surgeon: Clayburn Pert, MD;  Location: [facility] ORS;  Service: General;  Laterality: Left;     FAMILY HISTORY   Family History  Problem Relation Age of Onset   Hypertension Father    Heart disease Father    Clotting disorder Mother    Kidney disease Brother    Heart attack Maternal Grandmother    Heart attack Maternal Grandfather    Heart attack Paternal Grandfather      SOCIAL HISTORY   Social History   Tobacco Use   Smoking status: Never   Smokeless tobacco: Never  Vaping Use   Vaping Use: Never used  Substance Use Topics   Alcohol use: No   Drug use: No     MEDICATIONS    Home Medication:  Current Outpatient Rx   Order #: [id number] Class: Normal   Order #: [id number] Class: Normal   Order #: [id number] Class: Normal   Order #: [id number] Class:  Historical Med   Order #: [id number] Class: Historical Med   Order #: [id number] Class: Historical Med   Order #: [id number] Class: Normal   Order #: [id number] Class: Normal   Order #: [id number] Class: Normal   Order #: [id number] Class: Normal   Order #: [id number] Class: Historical Med   Order #: [id number] Class: Normal   Order #: [id number] Class: Historical Med   Order #: [id number] Class: Normal   Order #: [id number] Class: Historical Med   Order #: [id number] Class: Normal   Order #: [id number] Class: Normal   Order #: [id number] Class: Normal   Order #: [id number] Class: Historical Med   Order #: [id number] Class: Normal   Order #: [id number] Class: Historical Med   Order #: [id number] Class: Normal   Order #: [id number] Class: Normal   Order #: [id number] Class: Normal    Order #: [id number] Class: Historical Med   Order #: [id number] Class: Historical Med    Current Medication:  Current Facility-Administered Medications:    0.9 %  sodium chloride infusion, , Intravenous, Continuous, Ward, Kristen N, DO   0.9 %  sodium chloride infusion, , Intravenous, Continuous, Athena Masse, MD, Last Rate: 100 mL/hr at 09/10/21 1257, Rate Verify at 09/10/21 1257   amLODipine (NORVASC) tablet 5 mg, 5 mg, Oral, Daily, Judd Gaudier V, MD, 5 mg at 09/10/21 9935   apixaban (ELIQUIS) tablet 5 mg, 5 mg, Oral, BID, Athena Masse, MD, 5 mg at 09/10/21 7017   gabapentin (NEURONTIN) capsule 300 mg, 300 mg, Oral, TID, Athena Masse, MD, 300 mg at 09/10/21 1549   HYDROcodone-acetaminophen ([place]) 10-325 MG per tablet 1 tablet, 1 tablet, Oral, Q6H PRN, Athena Masse, MD, 1 tablet at 09/10/21 7939   ipratropium-albuterol (DUONEB) 0.5-2.5 (3) MG/3ML nebulizer solution 3 mL, 3 mL, Nebulization, Q4H PRN, Athena Masse, MD   montelukast (SINGULAIR) tablet 10 mg, 10 mg, Oral, QHS, Athena Masse, MD   mycophenolate (CELLCEPT) capsule 1,000 mg, 1,000 mg, Oral, BID, Judd Gaudier V, MD, 1,000 mg at 09/10/21 1004   ondansetron (ZOFRAN) tablet 4 mg, 4 mg, Oral, Q6H PRN **OR** ondansetron (ZOFRAN) injection 4 mg, 4 mg, Intravenous, Q6H PRN, Athena Masse, MD   predniSONE (DELTASONE) tablet 10 mg, 10 mg, Oral, Q breakfast, Judd Gaudier V, MD, 10 mg at 09/10/21 0300   rosuvastatin (CRESTOR) tablet 5 mg, 5 mg, Oral, QHS, Athena Masse, MD  Current Outpatient Medications:    albuterol (VENTOLIN HFA) 108 (90 Base) MCG/ACT inhaler, Inhale 2 puffs into the lungs every 6 (six) hours as needed for wheezing or shortness of breath., Disp: 18 g, Rfl: 2   amLODipine (NORVASC) 5 MG tablet, Take 1 tablet (5 mg total) by mouth daily., Disp: 90 tablet, Rfl: 3   apixaban (ELIQUIS) 5 MG TABS tablet, Take 1 tablet (5 mg total) by mouth 2 (two) times daily., Disp: 60 tablet, Rfl: 2   cholecalciferol  (VITAMIN D3) 25 MCG (1000 UNIT) tablet, Take 1,000 Units by mouth daily., Disp: , Rfl:    gabapentin (NEURONTIN) 300 MG capsule, Take 300 mg by mouth 3 (three) times daily., Disp: , Rfl:    HYDROcodone-acetaminophen (NORCO) 10-325 MG tablet, , Disp: , Rfl:    hydrOXYzine (ATARAX/VISTARIL) 10 MG tablet, Take 1-2 tablets (10-20 mg total) by mouth 3 (three) times daily as needed., Disp: 60 tablet, Rfl: 0   ipratropium-albuterol (DUONEB) 0.5-2.5 (3) MG/3ML SOLN, Take 3 mLs by nebulization every 4 (four) hours as needed., Disp: 360 mL, Rfl: 3   loratadine (CLARITIN) 10 MG tablet, Take 1 tablet (10 mg total) by mouth  daily., Disp: 90 tablet, Rfl: 3   losartan (COZAAR) 100 MG tablet, Take 1 tablet (100 mg total) by mouth daily., Disp: 90 tablet, Rfl: 3   Mepolizumab (NUCALA) 100 MG/ML SOAJ, Inject into the skin., Disp: , Rfl:    montelukast (SINGULAIR) 10 MG tablet, Take 1 tablet (10 mg total) by mouth at bedtime., Disp: 90 tablet, Rfl: 3   Multiple Vitamins-Minerals (MULTIVITAMIN WITH MINERALS) tablet, Take 1 tablet by mouth daily., Disp: , Rfl:    mupirocin ointment (BACTROBAN) 2 %, Apply 1 application topically daily. Until healed at bottom of right foot, Disp: 22 g, Rfl: 0   mycophenolate (CELLCEPT) 500 MG tablet, Take 1,000 mg by mouth 2 (two) times daily., Disp: , Rfl:    nystatin (MYCOSTATIN/NYSTOP) powder, Apply 1 application topically 3 (three) times daily., Disp: 15 g, Rfl: 0   nystatin-triamcinolone ointment (MYCOLOG), Apply 1 application topically 2 (two) times daily., Disp: 30 g, Rfl: 0   ondansetron (ZOFRAN) 4 MG tablet, Take 1 tablet (4 mg total) by mouth every 8 (eight) hours as needed for up to 10 doses for nausea or vomiting., Disp: 10 tablet, Rfl: 0   predniSONE (DELTASONE) 10 MG tablet, Take 10 mg by mouth daily with breakfast., Disp: , Rfl:    rosuvastatin (CRESTOR) 20 MG tablet, Take one pill every other night before bed., Disp: 90 tablet, Rfl: 0   sulfamethoxazole-trimethoprim  (BACTRIM) 400-80 MG tablet, Take 2 tablets by mouth 2 (two) times daily., Disp: , Rfl:    triamcinolone ointment (KENALOG) 0.1 %, Apply 1 application topically 2 (two) times daily as needed. To affected rash/patch areas to leg for up to 1 week at a time, Disp: 30 g, Rfl: 0   COVID-19 mRNA Vac-TriS, Pfizer, (PFIZER-BIONT COVID-19 VAC-TRIS) SUSP injection, Inject into the muscle., Disp: 0.3 mL, Rfl: 0   COVID-19 mRNA vaccine, Pfizer, 30 MCG/0.3ML injection, USE AS DIRECTED, Disp: .3 mL, Rfl: 0   gabapentin (NEURONTIN) 100 MG capsule, Take 100 mg by mouth 2 (two) times daily. (Patient not taking: No sig reported), Disp: , Rfl:    traMADol (ULTRAM) 50 MG tablet, Take by mouth every 6 (six) hours as needed. (Patient not taking: No sig reported), Disp: , Rfl:     ALLERGIES   Enalapril and Vicodin [hydrocodone-acetaminophen]     REVIEW OF SYSTEMS    Review of Systems:  Gen:  Denies  fever, sweats, chills weigh loss  HEENT: Denies blurred vision, double vision, ear pain, eye pain, hearing loss, nose bleeds, sore throat Cardiac:  No dizziness, chest pain or heaviness, chest tightness,edema Resp:   Denies cough or sputum porduction, shortness of breath,wheezing, hemoptysis,  Gi: Denies swallowing difficulty, stomach pain, nausea or vomiting, diarrhea, constipation, bowel incontinence Gu:  Denies bladder incontinence, burning urine Ext:   Denies Joint pain, stiffness or swelling Skin: Denies  skin rash, easy bruising or bleeding or hives Endoc:  Denies polyuria, polydipsia , polyphagia or weight change Psych:   Denies depression, insomnia or hallucinations   Other:  All other systems negative   VS: BP 111/79   Pulse 75   Temp 97.8 F (36.6 C) (Oral)   Resp 11   Ht 6' 3"  (1.905 m)   Wt 108.4 kg   SpO2 95%   BMI 29.87 kg/m      PHYSICAL EXAM    GENERAL:NAD, no fevers, chills, no weakness no fatigue HEAD: Normocephalic, atraumatic.  EYES: Pupils equal, round, reactive to light.  Extraocular muscles intact. No scleral icterus.  MOUTH: Moist mucosal membrane. Dentition intact. No abscess noted.  EAR, NOSE, THROAT: Clear without exudates. No external lesions.  NECK: Supple. No thyromegaly. No nodules. No JVD.  PULMONARY: bilateral rhonchi bilaterally  CARDIOVASCULAR: S1 and S2. Regular rate and rhythm. No murmurs, rubs, or gallops. No edema. Pedal pulses 2+ bilaterally.  GASTROINTESTINAL: Soft, nontender, nondistended. No masses. Positive bowel sounds. No hepatosplenomegaly.  MUSCULOSKELETAL: No swelling, clubbing, or edema. Range of motion full in all extremities.  NEUROLOGIC: Cranial nerves II through XII are intact. No gross focal neurological deficits. Sensation intact. Reflexes intact.  SKIN: No ulceration, lesions, rashes, or cyanosis. Skin warm and dry. Turgor intact.  PSYCHIATRIC: Mood, affect within normal limits. The patient is awake, alert and oriented x 3. Insight, judgment intact.       IMAGING    DG Chest 2 View  Result Date: 09/09/2021 CLINICAL DATA:  Shortness of breath and difficulty swallowing, initial encounter EXAM: CHEST - 2 VIEW COMPARISON:  08/22/2021 FINDINGS: Cardiac shadow is stable. Lungs are well aerated bilaterally. No focal infiltrate or sizable effusion is seen. Chronic bronchitic markings are noted in the bases stable from the previous exam. No bony abnormality is seen. IMPRESSION: Stable appearance of the chest when compared with the prior exam. No acute abnormality noted. Electronically Signed   By: Inez Catalina M.D.   On: 09/09/2021 18:27   CT Soft Tissue Neck W Contrast  Result Date: 08/22/2021 CLINICAL DATA:  Neck abscess, deep tissue. Shortness of breath and difficulty swallowing. EXAM: CT NECK WITH CONTRAST TECHNIQUE: Multidetector CT imaging of the neck was performed using the standard protocol following the bolus administration of intravenous contrast. CONTRAST:  152m OMNIPAQUE IOHEXOL 350 MG/ML SOLN COMPARISON:  Neck CTA  12/08/2007 FINDINGS: Pharynx and larynx: No evidence of a mass or significant swelling within limitations of dental streak artifact and mild motion. No fluid collection or significant inflammatory changes in the parapharyngeal or retropharyngeal spaces. Patent airway. Salivary glands: Chronic fatty atrophy of the submandibular glands as well as heterogeneous fatty infiltration and nodularity of the parotid glands with numerous punctate parotid calcifications suggesting a chronic inflammatory or autoimmune process such as Sjogren's. Thyroid: Unremarkable. Lymph nodes: No enlarged or suspicious lymph nodes in the neck. Vascular: Major vascular structures of the neck are grossly patent. Mild carotid atherosclerosis. Limited intracranial: Unremarkable. Visualized orbits: Unremarkable. Mastoids and visualized paranasal sinuses: Small right maxillary sinus. Minimal right posterior ethmoid air cell mucosal thickening. Clear mastoid air cells. Skeleton: Mild lower cervical and upper thoracic spondylosis. Hemangioma in the T3 vertebral body. Upper chest: Clear lung apices. Other: None. IMPRESSION: No acute abnormality identified in the neck. Electronically Signed   By: ALogan BoresM.D.   On: 08/22/2021 15:20   NM Pulmonary Perfusion  Result Date: 09/10/2021 CLINICAL DATA:  PE suspected.  High probability. EXAM: NUCLEAR MEDICINE PERFUSION LUNG SCAN TECHNIQUE: Perfusion images were obtained in multiple projections after intravenous injection of radiopharmaceutical. Ventilation scans intentionally deferred if perfusion scan and chest x-ray adequate for interpretation during COVID 19 epidemic. RADIOPHARMACEUTICALS:  3.93 mCi Tc-969mAA IV COMPARISON:  08/22/2021 FINDINGS: Uniform distribution of the radiopharmaceutical is identified. No suspicious segmental perfusion defects. IMPRESSION: No signs to suggest acute pulmonary embolus. Electronically Signed   By: TaKerby Moors.D.   On: 09/10/2021 09:46   DG Chest Port 1  View  Result Date: 08/22/2021 CLINICAL DATA:  Shortness of breath EXAM: PORTABLE CHEST 1 VIEW COMPARISON:  05/27/2021 FINDINGS: Heart size within normal limits. Mildly prominent bibasilar interstitial markings.  Chronic linear scarring in the periphery of the right lung base. No pleural effusion or pneumothorax. IMPRESSION: Mildly prominent bibasilar interstitial markings may reflect bronchitic type lung changes versus developing infiltrates. Electronically Signed   By: Davina Poke D.O.   On: 08/22/2021 14:34   CT ANGIO CHEST AORTA W/CM & OR WO/CM  Result Date: 08/22/2021 CLINICAL DATA:  Chest and back pain.  Suspect aortic dissection. EXAM: CT ANGIOGRAPHY CHEST WITH CONTRAST TECHNIQUE: Multidetector CT imaging of the chest was performed using the standard protocol during bolus administration of intravenous contrast. Multiplanar CT image reconstructions and MIPs were obtained to evaluate the vascular anatomy. CONTRAST:  183m OMNIPAQUE IOHEXOL 350 MG/ML SOLN COMPARISON:  Prior CT scan of the chest 04/27/2017 FINDINGS: Cardiovascular: Satisfactory opacification of the pulmonary arteries to the segmental level. No evidence of pulmonary embolism. Normal heart size. No pericardial effusion. Atherosclerotic calcifications are present along the coronary arteries. Mediastinum/Nodes: No enlarged mediastinal, hilar, or axillary lymph nodes. Thyroid gland, trachea, and esophagus demonstrate no significant findings. Small hiatal hernia. Lungs/Pleura: Small amount of linear atelectasis versus scarring in the periphery of the right lower lobe. Otherwise, the lungs are clear. Upper Abdomen: Mildly complex cyst in the right hepatic lobe with calcified septations is grossly stable at 9.7 x 8.2 cm. Musculoskeletal: No chest wall abnormality. No acute or significant osseous findings. Review of the MIP images confirms the above findings. IMPRESSION: 1. No evidence of aortic dissection or aneurysm. 2. Coronary artery  calcifications. 3. Small hiatal hernia. 4. Stable mildly complex right hepatic cyst. Electronically Signed   By: HJacqulynn CadetM.D.   On: 08/22/2021 15:33      ASSESSMENT/PLAN   Acute respiratory distress with NVD - present on admission  - COVID19 negative   - supplemental O2 during my evaluation - room air -Diarreah has resolved  - patient in no distress at this time -CT chest with bilateral bronchitic changes  - will perform infectious due to immunocompromised state -Respiratory viral panel -serum fungitell -legionella ab -strep pneumoniae ur AG -Histoplasma Ur Ag -sputum resp cultures -AFB sputum expectorated specimen -sputum cytology  -reviewed pertinent imaging with patient today - ESR -TTE for cardiac dysfunction of SOB/DOE -PT/OT for d/c planning  -please encourage patient to use incentive spirometer few times each hour while hospitalized.        Thank you for allowing me to participate in the care of this patient.  Total face to face encounter time for this patient visit was >441m. >50% of the time was  spent in counseling and coordination of care.   Patient/Family are satisfied with care plan and all questions have been answered.  This document was prepared using Dragon voice recognition software and may include unintentional dictation errors.     FuOttie GlazierM.D.  Division of [facility]

## 2021-09-10 NOTE — ED Notes (Signed)
Pt ambulatory to restroom using walker.

## 2021-09-10 NOTE — ED Notes (Signed)
Pt transported to [facility]. 

## 2021-09-10 NOTE — Progress Notes (Signed)
Patient seen and examined at the bedside.  He complains of cough, shortness of breath, generalized weakness and diarrhea..  No chest pain.  No evidence of acute pulmonary embolism on VQ lung scan.  Continue IV fluids for AKI.  Obtain 2D echo for further evaluation of shortness of breath.  Consulted pulmonologist to assist with management.  Continue current management plan.

## 2021-09-10 NOTE — ED Notes (Signed)
Pt has no complaints at this time. Pt had used hospital phone to contact family. Pt has been pleasant throughout the day.

## 2021-09-11 ENCOUNTER — Ambulatory Visit: Payer: 59 | Admitting: Urology

## 2021-09-11 DIAGNOSIS — N179 Acute kidney failure, unspecified: Secondary | ICD-10-CM | POA: Diagnosis not present

## 2021-09-11 DIAGNOSIS — N189 Chronic kidney disease, unspecified: Secondary | ICD-10-CM | POA: Diagnosis not present

## 2021-09-11 LAB — ECHOCARDIOGRAM COMPLETE
Area-P 1/2: 2.95 cm2
S' Lateral: 3.4 cm

## 2021-09-11 LAB — MRSA NEXT GEN BY PCR, NASAL: MRSA by PCR Next Gen: NOT DETECTED

## 2021-09-11 MED ORDER — ONDANSETRON HCL 4 MG PO TABS
4.0000 mg | ORAL_TABLET | Freq: Four times a day (QID) | ORAL | 0 refills | Status: DC | PRN
Start: 2021-09-11 — End: 2021-09-13

## 2021-09-11 NOTE — Progress Notes (Signed)
VSS. Discharge instructions given to patient. Verbalized understanding. . IV removed. TOC has seen patient at bedside. Patient to drive himself home, MD aware. No acute distress at this time

## 2021-09-11 NOTE — Discharge Summary (Signed)
Physician Discharge Summary  Jonathon Snow CVE:[id number] DOB: [birth year] DOA: 09/10/2021  PCP: Delsa Grana, PA-C  Admit date: 09/10/2021 Discharge date: 09/11/2021  Admitted From: Home Disposition: Home  Recommendations for Outpatient Follow-up:  Follow up with PCP in 1-2 weeks Follow-up with pulmonology as directed  Home Health: Yes, home health PT Equipment/Devices: No  Discharge Condition: Stable CODE STATUS: Full Diet recommendation: Regular  Brief/Interim Summary: 60 y.o. male with medical history significant for COPD, SLE on CellCept and prednisone, history of DVT and PE on Eliquis, CKD 3a who presents to the ED with a 3-day history of nausea and diarrhea.  He denies abdominal pain and denies fever but has been having chills.  Patient also has a complaint of shortness of breath with occasional cough for which she was seen in the ED on 9/1 and had an unremarkable work-up which included a negative CTA chest.  He was diagnosed with acute viral bronchitis at the time.  COVID was negative.  Due to persistent symptoms and new diarrhea returns to the ED for evaluation.  Etiology of complaints is unclear.  Diarrhea nausea did resolve.  Patient tolerating p.o. intake at time of discharge.  Discussed with pulmonology.  Cleared for discharge from their standpoint.  We will follow-up on infectious panel as outpatient.  No changes made in home medication regimen.  Small amount of as needed Zofran provided on discharge.  Echocardiogram demonstrated normal ejection fraction with pseudo normal diastolic dysfunction.   Discharge Diagnoses:  Principal Problem:   Acute kidney injury superimposed on CKD llla ([place]) Active Problems:   History of pulmonary embolism   COPD, moderate (HCC)   Systemic lupus erythematosus (HCC)   Long term current use of anticoagulant   Benign essential hypertension   Long term current use of systemic steroids   Hypertension   Acute gastroenteritis  Acute kidney  injury superimposed on CKD llla ([place])   Acute gastroenteritis - Patient with 3-day history of nausea and nonbloody diarrhea, with creatinine 2.41 up from baseline of 1.3 Suspect AKI secondary to poor p.o. intake in setting of acute gastroenteritis.  Symptoms of gastroenteritis resolved during hospital admission.  Creatinine improving but not at baseline at time of discharge.  Recommend adequate p.o. fluid intake and can restart home losartan.  Shortness of breath x3 weeks  History of pulmonary embolism on Eliquis - Results     COPD, moderate (HCC) - Does not appear acutely exacerbated - Albuterol as needed     Systemic lupus erythematosus ([place])   Long term current use of immunosuppressive meds - Continue CellCept and prednisone     Benign essential hypertension - Continue amlodipine  Discharge Instructions  Discharge Instructions     Diet - low sodium heart healthy   Complete by: As directed    Increase activity slowly   Complete by: As directed       Allergies as of 09/11/2021       Reactions   Enalapril Other (See Comments)   Vicodin [hydrocodone-acetaminophen] Hives, Rash   Severe headaches (also)        Medication List     STOP taking these medications    traMADol 50 MG tablet Commonly known as: ULTRAM       TAKE these medications    albuterol 108 (90 Base) MCG/ACT inhaler Commonly known as: VENTOLIN HFA Inhale 2 puffs into the lungs every 6 (six) hours as needed for wheezing or shortness of breath.   amLODipine 5 MG tablet Commonly known as:  NORVASC Take 1 tablet (5 mg total) by mouth daily.   apixaban 5 MG Tabs tablet Commonly known as: Eliquis Take 1 tablet (5 mg total) by mouth 2 (two) times daily.   cholecalciferol 25 MCG (1000 UNIT) tablet Commonly known as: VITAMIN D3 Take 1,000 Units by mouth daily.   gabapentin 300 MG capsule Commonly known as: NEURONTIN Take 300 mg by mouth 3 (three) times daily. What changed: Another medication  with the same name was removed. Continue taking this medication, and follow the directions you see here.   HYDROcodone-acetaminophen 10-325 MG tablet Commonly known as: NORCO   hydrOXYzine 10 MG tablet Commonly known as: ATARAX/VISTARIL Take 1-2 tablets (10-20 mg total) by mouth 3 (three) times daily as needed.   ipratropium-albuterol 0.5-2.5 (3) MG/3ML Soln Commonly known as: DUONEB Take 3 mLs by nebulization every 4 (four) hours as needed.   loratadine 10 MG tablet Commonly known as: CLARITIN Take 1 tablet (10 mg total) by mouth daily.   losartan 100 MG tablet Commonly known as: COZAAR Take 1 tablet (100 mg total) by mouth daily.   montelukast 10 MG tablet Commonly known as: SINGULAIR Take 1 tablet (10 mg total) by mouth at bedtime.   multivitamin with minerals tablet Take 1 tablet by mouth daily.   mupirocin ointment 2 % Commonly known as: BACTROBAN Apply 1 application topically daily. Until healed at bottom of right foot   mycophenolate 500 MG tablet Commonly known as: CELLCEPT Take 1,000 mg by mouth 2 (two) times daily.   Nucala 100 MG/ML Soaj Generic drug: Mepolizumab Inject into the skin.   nystatin powder Commonly known as: MYCOSTATIN/NYSTOP Apply 1 application topically 3 (three) times daily.   nystatin-triamcinolone ointment Commonly known as: MYCOLOG Apply 1 application topically 2 (two) times daily.   ondansetron 4 MG tablet Commonly known as: Zofran Take 1 tablet (4 mg total) by mouth every 8 (eight) hours as needed for up to 10 doses for nausea or vomiting. What changed: Another medication with the same name was added. Make sure you understand how and when to take each.   ondansetron 4 MG tablet Commonly known as: ZOFRAN Take 1 tablet (4 mg total) by mouth every 6 (six) hours as needed for nausea. What changed: You were already taking a medication with the same name, and this prescription was added. Make sure you understand how and when to take  each.   Pfizer-BioNTech COVID-19 Vacc 30 MCG/0.3ML injection Generic drug: COVID-19 mRNA vaccine (Pfizer) USE AS DIRECTED   Pfizer-BioNT COVID-19 Vac-TriS Susp injection Generic drug: COVID-19 mRNA Vac-TriS (Pfizer) Inject into the muscle.   predniSONE 10 MG tablet Commonly known as: DELTASONE Take 10 mg by mouth daily with breakfast.   rosuvastatin 20 MG tablet Commonly known as: Crestor Take one pill every other night before bed.   sulfamethoxazole-trimethoprim 400-80 MG tablet Commonly known as: BACTRIM Take 2 tablets by mouth 2 (two) times daily.   triamcinolone ointment 0.1 % Commonly known as: KENALOG Apply 1 application topically 2 (two) times daily as needed. To affected rash/patch areas to leg for up to 1 week at a time        Follow-up Information     Delsa Grana, PA-C. Schedule an appointment as soon as possible for a visit in 1 week(s).   Specialty: Family Medicine Contact information: [address] [place] [address] [phone]         [name], MD Follow up.   Specialty: Pulmonary Disease Why: Follow  up as directed by Dr. Nile Riggs information: [place] [address] [phone]                Allergies  Allergen Reactions   Enalapril Other (See Comments)   Vicodin [Hydrocodone-Acetaminophen] Hives and Rash    Severe headaches (also)    Consultations: Pulmonology   Procedures/Studies: DG Chest 2 View  Result Date: 09/09/2021 CLINICAL DATA:  Shortness of breath and difficulty swallowing, initial encounter EXAM: CHEST - 2 VIEW COMPARISON:  08/22/2021 FINDINGS: Cardiac shadow is stable. Lungs are well aerated bilaterally. No focal infiltrate or sizable effusion is seen. Chronic bronchitic markings are noted in the bases stable from the previous exam. No bony abnormality is seen. IMPRESSION: Stable appearance of the chest when compared with the prior exam. No acute abnormality noted.  Electronically Signed   By: Inez Catalina M.D.   On: 09/09/2021 18:27   CT Soft Tissue Neck W Contrast  Result Date: 08/22/2021 CLINICAL DATA:  Neck abscess, deep tissue. Shortness of breath and difficulty swallowing. EXAM: CT NECK WITH CONTRAST TECHNIQUE: Multidetector CT imaging of the neck was performed using the standard protocol following the bolus administration of intravenous contrast. CONTRAST:  159mL OMNIPAQUE IOHEXOL 350 MG/ML SOLN COMPARISON:  Neck CTA 12/08/2007 FINDINGS: Pharynx and larynx: No evidence of a mass or significant swelling within limitations of dental streak artifact and mild motion. No fluid collection or significant inflammatory changes in the parapharyngeal or retropharyngeal spaces. Patent airway. Salivary glands: Chronic fatty atrophy of the submandibular glands as well as heterogeneous fatty infiltration and nodularity of the parotid glands with numerous punctate parotid calcifications suggesting a chronic inflammatory or autoimmune process such as Sjogren's. Thyroid: Unremarkable. Lymph nodes: No enlarged or suspicious lymph nodes in the neck. Vascular: Major vascular structures of the neck are grossly patent. Mild carotid atherosclerosis. Limited intracranial: Unremarkable. Visualized orbits: Unremarkable. Mastoids and visualized paranasal sinuses: Small right maxillary sinus. Minimal right posterior ethmoid air cell mucosal thickening. Clear mastoid air cells. Skeleton: Mild lower cervical and upper thoracic spondylosis. Hemangioma in the T3 vertebral body. Upper chest: Clear lung apices. Other: None. IMPRESSION: No acute abnormality identified in the neck. Electronically Signed   By: Logan Bores M.D.   On: 08/22/2021 15:20   NM Pulmonary Perfusion  Result Date: 09/10/2021 CLINICAL DATA:  PE suspected.  High probability. EXAM: NUCLEAR MEDICINE PERFUSION LUNG SCAN TECHNIQUE: Perfusion images were obtained in multiple projections after intravenous injection of  radiopharmaceutical. Ventilation scans intentionally deferred if perfusion scan and chest x-ray adequate for interpretation during COVID 19 epidemic. RADIOPHARMACEUTICALS:  3.93 mCi Tc-18m MAA IV COMPARISON:  08/22/2021 FINDINGS: Uniform distribution of the radiopharmaceutical is identified. No suspicious segmental perfusion defects. IMPRESSION: No signs to suggest acute pulmonary embolus. Electronically Signed   By: Kerby Moors M.D.   On: 09/10/2021 09:46   DG Chest Port 1 View  Result Date: 08/22/2021 CLINICAL DATA:  Shortness of breath EXAM: PORTABLE CHEST 1 VIEW COMPARISON:  05/27/2021 FINDINGS: Heart size within normal limits. Mildly prominent bibasilar interstitial markings. Chronic linear scarring in the periphery of the right lung base. No pleural effusion or pneumothorax. IMPRESSION: Mildly prominent bibasilar interstitial markings may reflect bronchitic type lung changes versus developing infiltrates. Electronically Signed   By: Davina Poke D.O.   On: 08/22/2021 14:34   CT ANGIO CHEST AORTA W/CM & OR WO/CM  Result Date: 08/22/2021 CLINICAL DATA:  Chest and back pain.  Suspect aortic dissection. EXAM: CT ANGIOGRAPHY CHEST WITH CONTRAST TECHNIQUE: Multidetector  CT imaging of the chest was performed using the standard protocol during bolus administration of intravenous contrast. Multiplanar CT image reconstructions and MIPs were obtained to evaluate the vascular anatomy. CONTRAST:  137mL OMNIPAQUE IOHEXOL 350 MG/ML SOLN COMPARISON:  Prior CT scan of the chest 04/27/2017 FINDINGS: Cardiovascular: Satisfactory opacification of the pulmonary arteries to the segmental level. No evidence of pulmonary embolism. Normal heart size. No pericardial effusion. Atherosclerotic calcifications are present along the coronary arteries. Mediastinum/Nodes: No enlarged mediastinal, hilar, or axillary lymph nodes. Thyroid gland, trachea, and esophagus demonstrate no significant findings. Small hiatal hernia.  Lungs/Pleura: Small amount of linear atelectasis versus scarring in the periphery of the right lower lobe. Otherwise, the lungs are clear. Upper Abdomen: Mildly complex cyst in the right hepatic lobe with calcified septations is grossly stable at 9.7 x 8.2 cm. Musculoskeletal: No chest wall abnormality. No acute or significant osseous findings. Review of the MIP images confirms the above findings. IMPRESSION: 1. No evidence of aortic dissection or aneurysm. 2. Coronary artery calcifications. 3. Small hiatal hernia. 4. Stable mildly complex right hepatic cyst. Electronically Signed   By: Jacqulynn Cadet M.D.   On: 08/22/2021 15:33   ECHOCARDIOGRAM COMPLETE  Result Date: 09/11/2021    ECHOCARDIOGRAM REPORT   Patient Name:   Jonathon Snow Date of Exam: 09/10/2021 Medical Rec #:  [id number]      Height:       75.0 in Accession #:    [id number]     Weight:       239.0 lb Date of Birth:  04/10/1961     BSA:          2.367 m Patient Age:    60 years       BP:           147/57 mmHg Patient Gender: M              HR:           76 bpm. Exam Location:  [facility] Procedure: 2D Echo, Cardiac Doppler and Color Doppler Indications:     R06.00 Dyspnea  History:         Patient has no prior history of Echocardiogram examinations.                  Risk Factors:Hypertension, Diabetes and Dyslipidemia. History                  of COVID-19. DVT. Lupus. Lymphedema. Pulmonary embolism.  Sonographer:     Cresenciano Lick RDCS Referring Phys:  [id number] Jennye Boroughs Diagnosing Phys: Yolonda Kida MD IMPRESSIONS  1. Left ventricular ejection fraction, by estimation, is 55 to 60%. The left ventricle has normal function. The left ventricle has no regional wall motion abnormalities. Left ventricular diastolic parameters are consistent with Grade II diastolic dysfunction (pseudonormalization).  2. Right ventricular systolic function is normal. The right ventricular size is normal.  3. The mitral valve is normal in structure. No evidence  of mitral valve regurgitation.  4. The aortic valve is normal in structure. Aortic valve regurgitation is trivial. FINDINGS  Left Ventricle: Left ventricular ejection fraction, by estimation, is 55 to 60%. The left ventricle has normal function. The left ventricle has no regional wall motion abnormalities. The left ventricular internal cavity size was normal in size. There is  no left ventricular hypertrophy. Left ventricular diastolic parameters are consistent with Grade II diastolic dysfunction (pseudonormalization). Right Ventricle: The right ventricular size is normal. No increase in right ventricular  wall thickness. Right ventricular systolic function is normal. Left Atrium: Left atrial size was normal in size. Right Atrium: Right atrial size was normal in size. Pericardium: There is no evidence of pericardial effusion. Mitral Valve: The mitral valve is normal in structure. No evidence of mitral valve regurgitation. Tricuspid Valve: The tricuspid valve is normal in structure. Tricuspid valve regurgitation is trivial. Aortic Valve: The aortic valve is normal in structure. Aortic valve regurgitation is trivial. Pulmonic Valve: The pulmonic valve was normal in structure. Pulmonic valve regurgitation is not visualized. Aorta: The ascending aorta was not well visualized. IAS/Shunts: No atrial level shunt detected by color flow Doppler.  LEFT VENTRICLE PLAX 2D LVIDd:         4.90 cm  Diastology LVIDs:         3.40 cm  LV e' medial:    7.94 cm/s LV PW:         1.00 cm  LV E/e' medial:  9.2 LV IVS:        0.70 cm  LV e' lateral:   9.79 cm/s LVOT diam:     2.20 cm  LV E/e' lateral: 7.4 LV SV:         74 LV SV Index:   31 LVOT Area:     3.80 cm  RIGHT VENTRICLE RV Basal diam:  3.30 cm RV S prime:     12.90 cm/s TAPSE (M-mode): 1.3 cm LEFT ATRIUM             Index       RIGHT ATRIUM           Index LA diam:        3.90 cm 1.65 cm/m  RA Area:     10.60 cm LA Vol (A2C):   27.6 ml 11.66 ml/m RA Volume:   25.40 ml  10.73  ml/m LA Vol (A4C):   42.8 ml 18.08 ml/m LA Biplane Vol: 36.7 ml 15.50 ml/m  AORTIC VALVE LVOT Vmax:   94.60 cm/s LVOT Vmean:  68.600 cm/s LVOT VTI:    0.195 m  AORTA Ao Root diam: 3.50 cm Ao Asc diam:  3.20 cm MITRAL VALVE MV Area (PHT): 2.95 cm     SHUNTS MV Decel Time: 257 msec     Systemic VTI:  0.20 m MV E velocity: 72.70 cm/s   Systemic Diam: 2.20 cm MV A velocity: 103.00 cm/s MV E/A ratio:  0.71 Dwayne D Callwood MD Electronically signed by Yolonda Kida MD Signature Date/Time: 09/11/2021/1:00:58 PM    Final    (Echo, Carotid, EGD, Colonoscopy, ERCP)    Subjective: Seen and examined on the day of discharge.  Stable no distress.  Shortness of breath, gastroenteritis symptoms have resolved  Discharge Exam: Vitals:   09/11/21 1200 09/11/21 1300  BP: 135/85 (!) 158/83  Pulse: 79 85  Resp: 13 (!) 9  Temp:    SpO2: 96% 98%   Vitals:   09/11/21 0800 09/11/21 0900 09/11/21 1200 09/11/21 1300  BP: (!) 153/89 (!) 146/86 135/85 (!) 158/83  Pulse: 88 92 79 85  Resp: 14 15 13  (!) 9  Temp:      TempSrc:      SpO2: 100% 97% 96% 98%  Weight:      Height:        General: Pt is alert, awake, not in acute distress Cardiovascular: RRR, S1/S2 +, no rubs, no gallops Respiratory: CTA bilaterally, no wheezing, no rhonchi Abdominal: Soft, NT, ND, bowel sounds + Extremities:  no edema, no cyanosis    The results of significant diagnostics from this hospitalization (including imaging, microbiology, ancillary and laboratory) are listed below for reference.     Microbiology: Recent Results (from the past 240 hour(s))  Resp Panel by RT-PCR (Flu A&B, Covid) Nasopharyngeal Swab     Status: None   Collection Time: 09/09/21  5:40 PM   Specimen: Nasopharyngeal Swab; Nasopharyngeal(NP) swabs in vial transport medium  Result Value Ref Range Status   SARS Coronavirus 2 by RT PCR NEGATIVE NEGATIVE Final    Comment: (NOTE) SARS-CoV-2 target nucleic acids are NOT DETECTED.  The SARS-CoV-2 RNA is  generally detectable in upper respiratory specimens during the acute phase of infection. The lowest concentration of SARS-CoV-2 viral copies this assay can detect is 138 copies/mL. A negative result does not preclude SARS-Cov-2 infection and should not be used as the sole basis for treatment or other patient management decisions. A negative result may occur with  improper specimen collection/handling, submission of specimen other than nasopharyngeal swab, presence of viral mutation(s) within the areas targeted by this assay, and inadequate number of viral copies(<138 copies/mL). A negative result must be combined with clinical observations, patient history, and epidemiological information. The expected result is Negative.  Fact Sheet for Patients:  EntrepreneurPulse.com.au  Fact Sheet for Healthcare Providers:  IncredibleEmployment.be  This test is no t yet approved or cleared by the [place] FDA and  has been authorized for detection and/or diagnosis of SARS-CoV-2 by FDA under an Emergency Use Authorization (EUA). This EUA will remain  in effect (meaning this test can be used) for the duration of the COVID-19 declaration under Section 564(b)(1) of the Act, 21 U.S.C.section 360bbb-3(b)(1), unless the authorization is terminated  or revoked sooner.       Influenza A by PCR NEGATIVE NEGATIVE Final   Influenza B by PCR NEGATIVE NEGATIVE Final    Comment: (NOTE) The Xpert Xpress SARS-CoV-2/FLU/RSV plus assay is intended as an aid in the diagnosis of influenza from Nasopharyngeal swab specimens and should not be used as a sole basis for treatment. Nasal washings and aspirates are unacceptable for Xpert Xpress SARS-CoV-2/FLU/RSV testing.  Fact Sheet for Patients: EntrepreneurPulse.com.au  Fact Sheet for Healthcare Providers: IncredibleEmployment.be  This test is not yet approved or cleared by the [place] FDA and has been authorized for detection and/or diagnosis of SARS-CoV-2 by FDA under an Emergency Use Authorization (EUA). This EUA will remain in effect (meaning this test can be used) for the duration of the COVID-19 declaration under Section 564(b)(1) of the Act, 21 U.S.C. section 360bbb-3(b)(1), unless the authorization is terminated or revoked.  Performed at [facility], [address] [place], [place] [address]   CULTURE, BLOOD (ROUTINE X 2) w Reflex to ID Panel     Status: None (Preliminary result)   Collection Time: 09/10/21  6:41 PM   Specimen: BLOOD  Result Value Ref Range Status   Specimen Description BLOOD RIGHT ANTECUBITAL  Final   Special Requests   Final    BOTTLES DRAWN AEROBIC AND ANAEROBIC Blood Culture adequate volume   Culture   Final    NO GROWTH < 12 HOURS Performed at [facility], [address] [place], [place] [address]    Report Status PENDING  Incomplete  CULTURE, BLOOD (ROUTINE X 2) w Reflex to ID Panel     Status: None (Preliminary result)   Collection Time: 09/10/21  6:41 PM   Specimen: BLOOD  Result Value Ref Range Status  Specimen Description BLOOD BLOOD RIGHT HAND  Final   Special Requests   Final    BOTTLES DRAWN AEROBIC AND ANAEROBIC Blood Culture adequate volume   Culture   Final    NO GROWTH < 12 HOURS Performed at [facility], [address], [place], [place] [address]    Report Status PENDING  Incomplete  MRSA Next Gen by PCR, Nasal     Status: None   Collection Time: 09/11/21 10:35 AM   Specimen: Nasopharyngeal Swab; Nasal Swab  Result Value Ref Range Status   MRSA by PCR Next Gen NOT DETECTED NOT DETECTED Final    Comment: (NOTE) The GeneXpert MRSA Assay (FDA approved for NASAL specimens only), is one component of a comprehensive MRSA colonization surveillance program. It is not intended to diagnose MRSA infection nor to guide or monitor treatment for MRSA infections. Test performance is  not FDA approved in patients less than 37 years old. Performed at [facility], [address] [place], [place] [address]      Labs: BNP (last 3 results) Recent Labs    09/10/21 1831  BNP 61.6   Basic Metabolic Panel: Recent Labs  Lab 09/09/21 1734 09/10/21 1052  NA 137 138  K 4.5 3.8  CL 107 110  CO2 21* 21*  GLUCOSE 100* 107*  BUN 32* 29*  CREATININE 2.41* 1.96*  CALCIUM 9.5 8.6*   Liver Function Tests: Recent Labs  Lab 09/09/21 1734  AST 21  ALT 19  ALKPHOS 93  BILITOT 0.8  PROT 7.7  ALBUMIN 3.9   No results for input(s): LIPASE, AMYLASE in the last 168 hours. No results for input(s): AMMONIA in the last 168 hours. CBC: Recent Labs  Lab 09/09/21 1734 09/10/21 1052  WBC 7.5 5.2  NEUTROABS 5.1  --   HGB 13.6 11.8*  HCT 42.2 36.7*  MCV 89.6 88.9  PLT 239 187   Cardiac Enzymes: No results for input(s): CKTOTAL, CKMB, CKMBINDEX, TROPONINI in the last 168 hours. BNP: Invalid input(s): POCBNP CBG: No results for input(s): GLUCAP in the last 168 hours. D-Dimer No results for input(s): DDIMER in the last 72 hours. Hgb A1c No results for input(s): HGBA1C in the last 72 hours. Lipid Profile No results for input(s): CHOL, HDL, LDLCALC, TRIG, CHOLHDL, LDLDIRECT in the last 72 hours. Thyroid function studies No results for input(s): TSH, T4TOTAL, T3FREE, THYROIDAB in the last 72 hours.  Invalid input(s): FREET3 Anemia work up Recent Labs    09/10/21 1831  FERRITIN 50   Urinalysis    Component Value Date/Time   COLORURINE YELLOW 03/19/2021 0855   APPEARANCEUR CLEAR 03/19/2021 0855   APPEARANCEUR Clear 05/21/2017 1046   LABSPEC 1.014 03/19/2021 0855   LABSPEC 1.013 04/10/2015 1036   PHURINE < OR = 5.0 03/19/2021 0855   GLUCOSEU NEGATIVE 03/19/2021 0855   GLUCOSEU Negative 04/10/2015 1036   HGBUR NEGATIVE 03/19/2021 0855   BILIRUBINUR neg 07/23/2021 1423   BILIRUBINUR Negative 05/21/2017 1046   BILIRUBINUR Negative 04/10/2015 1036    KETONESUR NEGATIVE 03/19/2021 0855   PROTEINUR Negative 07/23/2021 1423   PROTEINUR 1+ (A) 03/19/2021 0855   UROBILINOGEN 0.2 07/23/2021 1423   UROBILINOGEN 0.2 03/17/2015 0025   NITRITE neg 07/23/2021 1423   NITRITE NEGATIVE 03/19/2021 0855   LEUKOCYTESUR Negative 07/23/2021 1423   LEUKOCYTESUR NEGATIVE 03/19/2021 0855   LEUKOCYTESUR Negative 04/10/2015 1036   Sepsis Labs Invalid input(s): PROCALCITONIN,  WBC,  LACTICIDVEN Microbiology Recent Results (from the past 240 hour(s))  Resp Panel by  RT-PCR (Flu A&B, Covid) Nasopharyngeal Swab     Status: None   Collection Time: 09/09/21  5:40 PM   Specimen: Nasopharyngeal Swab; Nasopharyngeal(NP) swabs in vial transport medium  Result Value Ref Range Status   SARS Coronavirus 2 by RT PCR NEGATIVE NEGATIVE Final    Comment: (NOTE) SARS-CoV-2 target nucleic acids are NOT DETECTED.  The SARS-CoV-2 RNA is generally detectable in upper respiratory specimens during the acute phase of infection. The lowest concentration of SARS-CoV-2 viral copies this assay can detect is 138 copies/mL. A negative result does not preclude SARS-Cov-2 infection and should not be used as the sole basis for treatment or other patient management decisions. A negative result may occur with  improper specimen collection/handling, submission of specimen other than nasopharyngeal swab, presence of viral mutation(s) within the areas targeted by this assay, and inadequate number of viral copies(<138 copies/mL). A negative result must be combined with clinical observations, patient history, and epidemiological information. The expected result is Negative.  Fact Sheet for Patients:  EntrepreneurPulse.com.au  Fact Sheet for Healthcare Providers:  IncredibleEmployment.be  This test is no t yet approved or cleared by the [place] FDA and  has been authorized for detection and/or diagnosis of SARS-CoV-2 by FDA under an  Emergency Use Authorization (EUA). This EUA will remain  in effect (meaning this test can be used) for the duration of the COVID-19 declaration under Section 564(b)(1) of the Act, 21 U.S.C.section 360bbb-3(b)(1), unless the authorization is terminated  or revoked sooner.       Influenza A by PCR NEGATIVE NEGATIVE Final   Influenza B by PCR NEGATIVE NEGATIVE Final    Comment: (NOTE) The Xpert Xpress SARS-CoV-2/FLU/RSV plus assay is intended as an aid in the diagnosis of influenza from Nasopharyngeal swab specimens and should not be used as a sole basis for treatment. Nasal washings and aspirates are unacceptable for Xpert Xpress SARS-CoV-2/FLU/RSV testing.  Fact Sheet for Patients: EntrepreneurPulse.com.au  Fact Sheet for Healthcare Providers: IncredibleEmployment.be  This test is not yet approved or cleared by the [place] FDA and has been authorized for detection and/or diagnosis of SARS-CoV-2 by FDA under an Emergency Use Authorization (EUA). This EUA will remain in effect (meaning this test can be used) for the duration of the COVID-19 declaration under Section 564(b)(1) of the Act, 21 U.S.C. section 360bbb-3(b)(1), unless the authorization is terminated or revoked.  Performed at [facility], [address] [place], [place] [address]   CULTURE, BLOOD (ROUTINE X 2) w Reflex to ID Panel     Status: None (Preliminary result)   Collection Time: 09/10/21  6:41 PM   Specimen: BLOOD  Result Value Ref Range Status   Specimen Description BLOOD RIGHT ANTECUBITAL  Final   Special Requests   Final    BOTTLES DRAWN AEROBIC AND ANAEROBIC Blood Culture adequate volume   Culture   Final    NO GROWTH < 12 HOURS Performed at [facility], [address] [place], [place] [address]    Report Status PENDING  Incomplete  CULTURE, BLOOD (ROUTINE X 2) w Reflex to ID Panel     Status: None (Preliminary result)   Collection Time:  09/10/21  6:41 PM   Specimen: BLOOD  Result Value Ref Range Status   Specimen Description BLOOD BLOOD RIGHT HAND  Final   Special Requests   Final    BOTTLES DRAWN AEROBIC AND ANAEROBIC Blood Culture adequate volume   Culture   Final    NO GROWTH < 12  HOURS Performed at [facility], [address] [place], [place] [address]    Report Status PENDING  Incomplete  MRSA Next Gen by PCR, Nasal     Status: None   Collection Time: 09/11/21 10:35 AM   Specimen: Nasopharyngeal Swab; Nasal Swab  Result Value Ref Range Status   MRSA by PCR Next Gen NOT DETECTED NOT DETECTED Final    Comment: (NOTE) The GeneXpert MRSA Assay (FDA approved for NASAL specimens only), is one component of a comprehensive MRSA colonization surveillance program. It is not intended to diagnose MRSA infection nor to guide or monitor treatment for MRSA infections. Test performance is not FDA approved in patients less than 50 years old. Performed at [facility], [address] [place], [place] [address]      Time coordinating discharge: Over 30 minutes  SIGNED:   Sidney Ace, MD  [facility] 09/11/2021, 3:00 PM Pager   If 7PM-7AM, please contact night-coverage

## 2021-09-11 NOTE — Evaluation (Signed)
Physical Therapy Evaluation Patient Details Name: Jonathon Snow MRN: [id number] DOB: 1961/04/28 Today's Date: 09/11/2021  History of Present Illness  Jonathon Snow is a 60 y.o. male with history of COPD, lupus on CellCept and prednisone, previous history of PE and DVT on Eliquis, chronic kidney disease who presents to the emergency department with increasing shortness of breath for the past 3 weeks.  Was seen in the emergency department on September 1 for similar symptoms and had an unremarkable work-up.  States that he has also been seen by pulmonology but states symptoms continue to progress.  He is not on oxygen at home.  He denies any fever but states he has had a productive cough for the past 2 days.  Has had nausea but no vomiting.  Has had diarrhea for 3 days.  No abdominal pain.  No lower extremity swelling or pain.  Has not missed any of his Eliquis.    Clinical Impression  Pt received in Semi-Fowler's position and agreeable to therapy.  Pt notes his breathing has been the most difficult portion of his rehab process and continues to be difficult for him.  Pt able to perform transfers and ambulation with FWW well, progressing to no AD and ambulating down the hall.  Pt does have some instability with ambulation with AD, mostly due to breathing difficulty.  Pt and therapist discussed HHPT option in order for pt to get strengthening/exercise regimen in order to return to PLOF and pt agreeable.  Pt will benefit from skilled PT intervention to increase independence and safety with basic mobility in preparation for discharge to the venue listed below.         Recommendations for follow up therapy are one component of a multi-disciplinary discharge planning process, led by the attending physician.  Recommendations may be updated based on patient status, additional functional criteria and insurance authorization.  Follow Up Recommendations Home health PT    Equipment Recommendations  None  recommended by PT    Recommendations for Other Services       Precautions / Restrictions Precautions Precautions: None Restrictions Weight Bearing Restrictions: No      Mobility  Bed Mobility Overal bed mobility: Modified Independent                  Transfers Overall transfer level: Needs assistance Equipment used: Rolling walker (2 wheeled) Transfers: Sit to/from Stand Sit to Stand: Min guard;Min assist         General transfer comment: Verbal cuing for hand placement and for coming upright with proper sequencing.  Ambulation/Gait Ambulation/Gait assistance: Min guard Gait Distance (Feet): 200 Feet Assistive device: Rolling walker (2 wheeled) Gait Pattern/deviations: Step-through pattern;Decreased stride length Gait velocity: decreased   General Gait Details: Pt with very slow and deliberate ambulation.  Stairs            Wheelchair Mobility    Modified Rankin (Stroke Patients Only)       Balance Overall balance assessment: Needs assistance Sitting-balance support: No upper extremity supported;Feet supported Sitting balance-Leahy Scale: Good     Standing balance support: Bilateral upper extremity supported Standing balance-Leahy Scale: Fair                               Pertinent Vitals/Pain Pain Assessment: 0-10 Pain Score: 4  Pain Location: L LE Pain Descriptors / Indicators: Aching;Shooting Pain Intervention(s): Limited activity within patient's tolerance;Monitored during session    Home  Living Family/patient expects to be discharged to:: Private residence Living Arrangements: Alone Available Help at Discharge: Family;Friend(s);Available PRN/intermittently Type of Home: House Home Access: Level entry     Home Layout: One level Home Equipment: None Additional Comments: Pt is looking at adding grabs bars and replacing toilets with handicapped height.    Prior Function Level of Independence: Independent          Comments: Pt notes he knows that ambulating is the best thing for him as far as exercise goes with his previous back surgeries, but due to the SOB, he has not been able to perform as much as he would like to.     Hand Dominance   Dominant Hand: Right    Extremity/Trunk Assessment                Communication   Communication: No difficulties  Cognition Arousal/Alertness: Awake/alert Behavior During Therapy: WFL for tasks assessed/performed Overall Cognitive Status: Within Functional Limits for tasks assessed                                        General Comments General comments (skin integrity, edema, etc.): Pt with good stanidng balance with UE support on FWW, then progressed to ambulation with no AD and has some instbaility with turns.    Exercises Total Joint Exercises Ankle Circles/Pumps: AROM;Strengthening;Both;10 reps;Supine Quad Sets: AROM;Strengthening;Both;10 reps;Supine Gluteal Sets: AROM;Strengthening;Both;10 reps;Supine Heel Slides: AROM;Strengthening;Both;10 reps;Supine Hip ABduction/ADduction: AROM;Strengthening;Both;10 reps;Supine Straight Leg Raises: AROM;Strengthening;Both;10 reps;Supine Long Arc Quad: AROM;Strengthening;Both;10 reps;Seated Marching in Standing: AROM;Strengthening;Both;10 reps;Seated Other Exercises Other Exercises: Pt educated on roles of PT and the services provided during hospital stay.  Pt also educated on the importance of mobility and exercising during stay to prevent muscle atrophy.   Assessment/Plan    PT Assessment Patient needs continued PT services  PT Problem List Decreased strength;Decreased activity tolerance;Decreased balance;Decreased mobility       PT Treatment Interventions DME instruction;Gait training;Functional mobility training;Therapeutic activities;Therapeutic exercise;Balance training;Neuromuscular re-education;Patient/family education    PT Goals (Current goals can be found in the Care  Plan section)  Acute Rehab PT Goals Patient Stated Goal: To go back home PT Goal Formulation: With patient Time For Goal Achievement: 09/25/21 Potential to Achieve Goals: Good    Frequency Min 2X/week   Barriers to discharge        Co-evaluation               AM-PAC PT "6 Clicks" Mobility  Outcome Measure Help needed turning from your back to your side while in a flat bed without using bedrails?: A Little Help needed moving from lying on your back to sitting on the side of a flat bed without using bedrails?: A Little Help needed moving to and from a bed to a chair (including a wheelchair)?: A Little Help needed standing up from a chair using your arms (e.g., wheelchair or bedside chair)?: A Little Help needed to walk in hospital room?: A Little Help needed climbing 3-5 steps with a railing? : A Little 6 Click Score: 18    End of Session Equipment Utilized During Treatment: Gait belt Activity Tolerance: Patient tolerated treatment well Patient left: in bed;with call bell/phone within reach Nurse Communication: Mobility status PT Visit Diagnosis: Unsteadiness on feet (R26.81);Other abnormalities of gait and mobility (R26.89);Muscle weakness (generalized) (M62.81)    Time: 3007-6226 PT Time Calculation (min) (ACUTE ONLY): 31 min  Charges:   PT Evaluation $PT Eval Low Complexity: 1 Low PT Treatments $Gait Training: 8-22 mins $Therapeutic Exercise: 8-22 mins        Gwenlyn Saran, PT, DPT 09/11/21, 1:07 PM   Christie Nottingham 09/11/2021, 1:00 PM

## 2021-09-11 NOTE — Progress Notes (Signed)
Pulmonary Medicine          Date: 09/11/2021,   MRN# [id number] Jonathon Snow 25-Feb-1961     AdmissionWeight: 108.4 kg                 CurrentWeight: 108.4 kg   Referring physician: Dr Mal Misty   CHIEF COMPLAINT:   Acute respiratory distress with dyspnea at rest and diarreah   HISTORY OF PRESENT ILLNESS   This is a 60 yo M with hx of SLE on Cellcept, emphysema with COPD on chronic prednisone, CKD, hx of DVT/PE came in with NVD x 3d as well as DOE/SOB at rest and cough.  He was seen in ER 3 wks ago for similar complaints and had CT chest done with findings suggestive of sub acute viral bronchitis, had COVID testing which was negative. CT chest reviewed by my with no PE noted, absence of infiltrate/consolidation/effusion/edema but does show tram tracking bilaterally suggestive of reactive bronchitic changes.  His vital signs were WNL and CMP unchanged from previous. PCCM consultation for additional evaluation and management.   09/11/21 - patient seen and examined at bedside, he is stable no overnight events.  He is able to continue therapy at home with follow up to pulmonary on outpatient within 7d. CRP and Procal normal.  Patient did have atelectasis and should have incentive spiro for home use.     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   Overview:  superior sagittal sinus, left transverse sinus and cortical veins    COVID-19 virus infection 12/2020   Depression    DVT (deep venous thrombosis) (HCC)    GERD (gastroesophageal reflux disease)    Heel spur, left 02/16/2019   Heel spur, right 02/16/2019   Hyperlipidemia    Hypertension    Lupus ([place])    Lymphedema 10/07/2018   Morbid obesity ([place])    Opiate abuse, episodic ([place]) 02/26/2018   Osteoporosis    Postphlebitic syndrome with ulcer, left ([place]) 11/18/2016   Presence of IVC filter 03/22/2020   Pulmonary embolism  ([place])    Renal disorder    Stage III     SURGICAL HISTORY   Past Surgical History:  Procedure Laterality Date   ANKLE SURGERY Right    COLONOSCOPY WITH PROPOFOL N/A 05/28/2020   Procedure: COLONOSCOPY WITH PROPOFOL;  Surgeon: Jonathon Bellows, MD;  Location: [facility] ENDOSCOPY;  Service: Endoscopy;  Laterality: N/A;   CYST EXCISION  92 or 93    Liver cyst removal [facility]   I & D EXTREMITY Right 04/29/2017   Procedure: IRRIGATION AND DEBRIDEMENT EXTREMITY;  Surgeon: Clayburn Pert, MD;  Location: [facility] ORS;  Service: General;  Laterality: Right;   IRRIGATION AND DEBRIDEMENT ABSCESS Left 04/29/2017   Procedure: IRRIGATION AND DEBRIDEMENT Scrotal ABSCESS;  Surgeon: Clayburn Pert, MD;  Location: [facility] ORS;  Service: General;  Laterality: Left;     FAMILY HISTORY   Family History  Problem Relation Age of Onset   Hypertension Father    Heart disease Father    Clotting disorder Mother    Kidney disease Brother    Heart attack Maternal Grandmother    Heart attack Maternal Grandfather    Heart attack Paternal Grandfather      SOCIAL HISTORY   Social History   Tobacco Use   Smoking status: Never   Smokeless tobacco: Never  Vaping Use   Vaping Use: Never  used  Substance Use Topics   Alcohol use: No   Drug use: No     MEDICATIONS    Home Medication:  Current Outpatient Rx   Order #: [id number] Class: Normal   Order #: [id number] Class: Normal   Order #: [id number] Class: Normal   Order #: [id number] Class: Historical Med   Order #: [id number] Class: Historical Med   Order #: [id number] Class: Historical Med   Order #: [id number] Class: Normal   Order #: [id number] Class: Normal   Order #: [id number] Class: Normal   Order #: [id number] Class: Normal   Order #: [id number] Class: Historical Med   Order #: [id number] Class: Normal   Order #: [id number] Class: Historical Med   Order #: [id number] Class: Normal   Order #: [id number] Class: Historical Med   Order #: [id number] Class: Normal   Order #:  [id number] Class: Normal   Order #: [id number] Class: Normal   Order #: [id number] Class: Historical Med   Order #: [id number] Class: Normal   Order #: [id number] Class: Historical Med   Order #: [id number] Class: Normal   Order #: [id number] Class: Normal   Order #: [id number] Class: Normal   Order #: [id number] Class: Normal    Current Medication:  Current Facility-Administered Medications:    amLODipine (NORVASC) tablet 5 mg, 5 mg, Oral, Daily, Judd Gaudier V, MD, 5 mg at 09/11/21 0820   apixaban (ELIQUIS) tablet 5 mg, 5 mg, Oral, BID, Athena Masse, MD, 5 mg at 09/11/21 0820   gabapentin (NEURONTIN) capsule 300 mg, 300 mg, Oral, TID, Athena Masse, MD, 300 mg at 09/11/21 0819   HYDROcodone-acetaminophen (Page) 10-325 MG per tablet 1 tablet, 1 tablet, Oral, Q6H PRN, Athena Masse, MD, 1 tablet at 09/10/21 0947   ipratropium-albuterol (DUONEB) 0.5-2.5 (3) MG/3ML nebulizer solution 3 mL, 3 mL, Nebulization, Q4H PRN, Athena Masse, MD   montelukast (SINGULAIR) tablet 10 mg, 10 mg, Oral, QHS, Athena Masse, MD, 10 mg at 09/10/21 2258   mycophenolate (CELLCEPT) capsule 1,000 mg, 1,000 mg, Oral, BID, Judd Gaudier V, MD, 1,000 mg at 09/11/21 0819   ondansetron (ZOFRAN) tablet 4 mg, 4 mg, Oral, Q6H PRN **OR** ondansetron (ZOFRAN) injection 4 mg, 4 mg, Intravenous, Q6H PRN, Athena Masse, MD   pantoprazole (PROTONIX) injection 40 mg, 40 mg, Intravenous, Q24H, Trenia Tennyson, MD, 40 mg at 09/10/21 2259   predniSONE (DELTASONE) tablet 10 mg, 10 mg, Oral, Q breakfast, Judd Gaudier V, MD, 10 mg at 09/11/21 0820   rosuvastatin (CRESTOR) tablet 5 mg, 5 mg, Oral, QHS, Athena Masse, MD, 5 mg at 09/10/21 2258  Current Outpatient Medications:    albuterol (VENTOLIN HFA) 108 (90 Base) MCG/ACT inhaler, Inhale 2 puffs into the lungs every 6 (six) hours as needed for wheezing or shortness of breath., Disp: 18 g, Rfl: 2   amLODipine (NORVASC) 5 MG tablet, Take 1 tablet (5 mg total) by mouth daily., Disp: 90  tablet, Rfl: 3   apixaban (ELIQUIS) 5 MG TABS tablet, Take 1 tablet (5 mg total) by mouth 2 (two) times daily., Disp: 60 tablet, Rfl: 2   cholecalciferol (VITAMIN D3) 25 MCG (1000 UNIT) tablet, Take 1,000 Units by mouth daily., Disp: , Rfl:    gabapentin (NEURONTIN) 300 MG capsule, Take 300 mg by mouth 3 (three) times daily., Disp: , Rfl:    HYDROcodone-acetaminophen (NORCO) 10-325 MG tablet, , Disp: , Rfl:    hydrOXYzine (ATARAX/VISTARIL) 10 MG tablet, Take 1-2 tablets (10-20 mg total) by mouth 3 (three) times daily as needed., Disp: 60 tablet, Rfl: 0   ipratropium-albuterol (DUONEB) 0.5-2.5 (3) MG/3ML SOLN,  Take 3 mLs by nebulization every 4 (four) hours as needed., Disp: 360 mL, Rfl: 3   loratadine (CLARITIN) 10 MG tablet, Take 1 tablet (10 mg total) by mouth daily., Disp: 90 tablet, Rfl: 3   losartan (COZAAR) 100 MG tablet, Take 1 tablet (100 mg total) by mouth daily., Disp: 90 tablet, Rfl: 3   Mepolizumab (NUCALA) 100 MG/ML SOAJ, Inject into the skin., Disp: , Rfl:    montelukast (SINGULAIR) 10 MG tablet, Take 1 tablet (10 mg total) by mouth at bedtime., Disp: 90 tablet, Rfl: 3   Multiple Vitamins-Minerals (MULTIVITAMIN WITH MINERALS) tablet, Take 1 tablet by mouth daily., Disp: , Rfl:    mupirocin ointment (BACTROBAN) 2 %, Apply 1 application topically daily. Until healed at bottom of right foot, Disp: 22 g, Rfl: 0   mycophenolate (CELLCEPT) 500 MG tablet, Take 1,000 mg by mouth 2 (two) times daily., Disp: , Rfl:    nystatin (MYCOSTATIN/NYSTOP) powder, Apply 1 application topically 3 (three) times daily., Disp: 15 g, Rfl: 0   nystatin-triamcinolone ointment (MYCOLOG), Apply 1 application topically 2 (two) times daily., Disp: 30 g, Rfl: 0   ondansetron (ZOFRAN) 4 MG tablet, Take 1 tablet (4 mg total) by mouth every 8 (eight) hours as needed for up to 10 doses for nausea or vomiting., Disp: 10 tablet, Rfl: 0   predniSONE (DELTASONE) 10 MG tablet, Take 10 mg by mouth daily with breakfast., Disp:  , Rfl:    rosuvastatin (CRESTOR) 20 MG tablet, Take one pill every other night before bed., Disp: 90 tablet, Rfl: 0   sulfamethoxazole-trimethoprim (BACTRIM) 400-80 MG tablet, Take 2 tablets by mouth 2 (two) times daily., Disp: , Rfl:    triamcinolone ointment (KENALOG) 0.1 %, Apply 1 application topically 2 (two) times daily as needed. To affected rash/patch areas to leg for up to 1 week at a time, Disp: 30 g, Rfl: 0   COVID-19 mRNA Vac-TriS, Pfizer, (PFIZER-BIONT COVID-19 VAC-TRIS) SUSP injection, Inject into the muscle., Disp: 0.3 mL, Rfl: 0   COVID-19 mRNA vaccine, Pfizer, 30 MCG/0.3ML injection, USE AS DIRECTED, Disp: .3 mL, Rfl: 0   ondansetron (ZOFRAN) 4 MG tablet, Take 1 tablet (4 mg total) by mouth every 6 (six) hours as needed for nausea., Disp: 20 tablet, Rfl: 0    ALLERGIES   Enalapril and Vicodin [hydrocodone-acetaminophen]     REVIEW OF SYSTEMS    Review of Systems:  Gen:  Denies  fever, sweats, chills weigh loss  HEENT: Denies blurred vision, double vision, ear pain, eye pain, hearing loss, nose bleeds, sore throat Cardiac:  No dizziness, chest pain or heaviness, chest tightness,edema Resp:   Denies cough or sputum porduction, shortness of breath,wheezing, hemoptysis,  Gi: Denies swallowing difficulty, stomach pain, nausea or vomiting, diarrhea, constipation, bowel incontinence Gu:  Denies bladder incontinence, burning urine Ext:   Denies Joint pain, stiffness or swelling Skin: Denies  skin rash, easy bruising or bleeding or hives Endoc:  Denies polyuria, polydipsia , polyphagia or weight change Psych:   Denies depression, insomnia or hallucinations   Other:  All other systems negative   VS: BP (!) 146/86   Pulse 92   Temp 97.9 F (36.6 C) (Oral)   Resp 15   Ht 6' 3"  (1.905 m)   Wt 108.4 kg   SpO2 97%   BMI 29.87 kg/m      PHYSICAL EXAM    GENERAL:NAD, no fevers, chills, no weakness no fatigue HEAD: Normocephalic, atraumatic.  EYES: Pupils equal,  round, reactive to  light. Extraocular muscles intact. No scleral icterus.  MOUTH: Moist mucosal membrane. Dentition intact. No abscess noted.  EAR, NOSE, THROAT: Clear without exudates. No external lesions.  NECK: Supple. No thyromegaly. No nodules. No JVD.  PULMONARY: bilateral rhonchi bilaterally  CARDIOVASCULAR: S1 and S2. Regular rate and rhythm. No murmurs, rubs, or gallops. No edema. Pedal pulses 2+ bilaterally.  GASTROINTESTINAL: Soft, nontender, nondistended. No masses. Positive bowel sounds. No hepatosplenomegaly.  MUSCULOSKELETAL: No swelling, clubbing, or edema. Range of motion full in all extremities.  NEUROLOGIC: Cranial nerves II through XII are intact. No gross focal neurological deficits. Sensation intact. Reflexes intact.  SKIN: No ulceration, lesions, rashes, or cyanosis. Skin warm and dry. Turgor intact.  PSYCHIATRIC: Mood, affect within normal limits. The patient is awake, alert and oriented x 3. Insight, judgment intact.       IMAGING    DG Chest 2 View  Result Date: 09/09/2021 CLINICAL DATA:  Shortness of breath and difficulty swallowing, initial encounter EXAM: CHEST - 2 VIEW COMPARISON:  08/22/2021 FINDINGS: Cardiac shadow is stable. Lungs are well aerated bilaterally. No focal infiltrate or sizable effusion is seen. Chronic bronchitic markings are noted in the bases stable from the previous exam. No bony abnormality is seen. IMPRESSION: Stable appearance of the chest when compared with the prior exam. No acute abnormality noted. Electronically Signed   By: Inez Catalina M.D.   On: 09/09/2021 18:27   CT Soft Tissue Neck W Contrast  Result Date: 08/22/2021 CLINICAL DATA:  Neck abscess, deep tissue. Shortness of breath and difficulty swallowing. EXAM: CT NECK WITH CONTRAST TECHNIQUE: Multidetector CT imaging of the neck was performed using the standard protocol following the bolus administration of intravenous contrast. CONTRAST:  181m OMNIPAQUE IOHEXOL 350 MG/ML SOLN  COMPARISON:  Neck CTA 12/08/2007 FINDINGS: Pharynx and larynx: No evidence of a mass or significant swelling within limitations of dental streak artifact and mild motion. No fluid collection or significant inflammatory changes in the parapharyngeal or retropharyngeal spaces. Patent airway. Salivary glands: Chronic fatty atrophy of the submandibular glands as well as heterogeneous fatty infiltration and nodularity of the parotid glands with numerous punctate parotid calcifications suggesting a chronic inflammatory or autoimmune process such as Sjogren's. Thyroid: Unremarkable. Lymph nodes: No enlarged or suspicious lymph nodes in the neck. Vascular: Major vascular structures of the neck are grossly patent. Mild carotid atherosclerosis. Limited intracranial: Unremarkable. Visualized orbits: Unremarkable. Mastoids and visualized paranasal sinuses: Small right maxillary sinus. Minimal right posterior ethmoid air cell mucosal thickening. Clear mastoid air cells. Skeleton: Mild lower cervical and upper thoracic spondylosis. Hemangioma in the T3 vertebral body. Upper chest: Clear lung apices. Other: None. IMPRESSION: No acute abnormality identified in the neck. Electronically Signed   By: ALogan BoresM.D.   On: 08/22/2021 15:20   NM Pulmonary Perfusion  Result Date: 09/10/2021 CLINICAL DATA:  PE suspected.  High probability. EXAM: NUCLEAR MEDICINE PERFUSION LUNG SCAN TECHNIQUE: Perfusion images were obtained in multiple projections after intravenous injection of radiopharmaceutical. Ventilation scans intentionally deferred if perfusion scan and chest x-ray adequate for interpretation during COVID 19 epidemic. RADIOPHARMACEUTICALS:  3.93 mCi Tc-936mAA IV COMPARISON:  08/22/2021 FINDINGS: Uniform distribution of the radiopharmaceutical is identified. No suspicious segmental perfusion defects. IMPRESSION: No signs to suggest acute pulmonary embolus. Electronically Signed   By: TaKerby Moors.D.   On: 09/10/2021 09:46    DG Chest Port 1 View  Result Date: 08/22/2021 CLINICAL DATA:  Shortness of breath EXAM: PORTABLE CHEST 1 VIEW COMPARISON:  05/27/2021 FINDINGS: Heart size  within normal limits. Mildly prominent bibasilar interstitial markings. Chronic linear scarring in the periphery of the right lung base. No pleural effusion or pneumothorax. IMPRESSION: Mildly prominent bibasilar interstitial markings may reflect bronchitic type lung changes versus developing infiltrates. Electronically Signed   By: Davina Poke D.O.   On: 08/22/2021 14:34   CT ANGIO CHEST AORTA W/CM & OR WO/CM  Result Date: 08/22/2021 CLINICAL DATA:  Chest and back pain.  Suspect aortic dissection. EXAM: CT ANGIOGRAPHY CHEST WITH CONTRAST TECHNIQUE: Multidetector CT imaging of the chest was performed using the standard protocol during bolus administration of intravenous contrast. Multiplanar CT image reconstructions and MIPs were obtained to evaluate the vascular anatomy. CONTRAST:  179m OMNIPAQUE IOHEXOL 350 MG/ML SOLN COMPARISON:  Prior CT scan of the chest 04/27/2017 FINDINGS: Cardiovascular: Satisfactory opacification of the pulmonary arteries to the segmental level. No evidence of pulmonary embolism. Normal heart size. No pericardial effusion. Atherosclerotic calcifications are present along the coronary arteries. Mediastinum/Nodes: No enlarged mediastinal, hilar, or axillary lymph nodes. Thyroid gland, trachea, and esophagus demonstrate no significant findings. Small hiatal hernia. Lungs/Pleura: Small amount of linear atelectasis versus scarring in the periphery of the right lower lobe. Otherwise, the lungs are clear. Upper Abdomen: Mildly complex cyst in the right hepatic lobe with calcified septations is grossly stable at 9.7 x 8.2 cm. Musculoskeletal: No chest wall abnormality. No acute or significant osseous findings. Review of the MIP images confirms the above findings. IMPRESSION: 1. No evidence of aortic dissection or aneurysm. 2.  Coronary artery calcifications. 3. Small hiatal hernia. 4. Stable mildly complex right hepatic cyst. Electronically Signed   By: HJacqulynn CadetM.D.   On: 08/22/2021 15:33      ASSESSMENT/PLAN   Acute respiratory distress with NVD - present on admission  - COVID19 negative   - supplemental O2 during my evaluation - room air -Diarreah has resolved  - patient in no distress at this time -CT chest with bilateral bronchitic changes  - will perform infectious due to immunocompromised state -Respiratory viral panel -serum fungitell -legionella ab -strep pneumoniae ur AG -Histoplasma Ur Ag -sputum resp cultures -AFB sputum expectorated specimen -sputum cytology  -reviewed pertinent imaging with patient today - ESR -TTE for cardiac dysfunction of SOB/DOE -PT/OT for d/c planning  -please encourage patient to use incentive spirometer few times each hour while hospitalized.        Thank you for allowing me to participate in the care of this patient.  Total face to face encounter time for this patient visit was >425m. >50% of the time was  spent in counseling and coordination of care.   Patient/Family are satisfied with care plan and all questions have been answered.  This document was prepared using Dragon voice recognition software and may include unintentional dictation errors.     FuOttie GlazierM.D.  Division of [place]

## 2021-09-11 NOTE — TOC Initial Note (Signed)
Transition of Care [facility]) - Initial/Assessment Note    Patient Details  Name: SHAHIEM Snow MRN: [id number] Date of Birth: October 11, 1961  [facility]) CM/SW Contact:    Ova Freshwater Phone Number: [phone] 09/11/2021, 3:54 PM  Clinical Narrative:                  Patient presents to [facility] due to SOB. Patient has back surgery on 6/22.  Patient independent with all ADLs. Patient will d/c home with home health orders for PT. CSW contacted [name] at [facility] and she will verify they can accept the patient. IF they accept the patient she will contact the patient in 24-48 hours.   CSW explained to patient if no one calls him from the home health agency in 24-48 hours he will need to contact his PCP.  Patient verbalized understanding.  Expected Discharge Plan: [place] Barriers to Discharge: No Barriers Identified   Patient Goals and CMS Choice     Choice offered to / list presented to : Patient  Expected Discharge Plan and Services Expected Discharge Plan: [place] In-house Referral: Clinical Social Work   Post Acute Care Choice: [place] arrangements for the past 2 months: [place] Expected Discharge Date: 09/11/21                         HH Arranged: PT [place]: [place] Date [place]: 09/11/21 Time [place]: [id number] Representative spoke with at [place]: [place]  Prior Living Arrangements/Services Living arrangements for the past 2 months: [place] with:: Self Patient language and need for interpreter reviewed:: Yes Do you feel safe going back to the place where you live?: Yes      Need for Family Participation in Patient Care: Yes (Comment) Care giver support system in place?: Yes (comment)   Criminal Activity/Legal Involvement Pertinent to Current Situation/Hospitalization: No - Comment as needed  Activities of Daily Living       Permission Sought/Granted Permission sought to share information with : Family Supports    Share Information with NAME: [name] (Daughter)   [phone] (Home Phone)           Emotional Assessment Appearance:: Appears stated age Attitude/Demeanor/Rapport: Engaged Affect (typically observed): Stable Orientation: : Oriented to Self, Oriented to Place, Oriented to  Time, Oriented to Situation Alcohol / Substance Use: Not Applicable Psych Involvement: No (comment)  Admission diagnosis:  AKI (acute kidney injury) ([place]) [N17.9] Patient Active Problem List   Diagnosis Date Noted   Acute gastroenteritis 09/10/2021   Hypertension 06/14/2021   Morbid obesity ([place])    COVID-19 virus infection 12/2020   Prediabetes 11/08/2020   Stage 3a chronic kidney disease ([place]) 11/08/2020   Seasonal allergies 11/08/2020   Rhinosinusitis 11/08/2020   Long term current use of systemic steroids 11/08/2020   Anticoagulant disorder ([place]) 11/07/2020   Senile purpura ([place]) 11/07/2020   History of DVT (deep vein thrombosis) 03/22/2020   Herpes zoster infection of lumbar region 02/20/2020   Severe episode of recurrent major depressive disorder, without psychotic features ([place]) 09/12/2019   Other spondylosis with radiculopathy, lumbar region 02/16/2019   Osteoporosis 10/12/2018   Chronic venous insufficiency 10/07/2018   SLE glomerulonephritis syndrome, 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[place]) 03/03/2018   Acute kidney injury superimposed on CKD llla ([place])    Syncope 12/29/2017   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 ([place]) 10/29/2016   Mixed hyperlipidemia 01/15/2016   Benign essential hypertension 01/15/2016   Obesity (BMI 30.0-34.9) 01/15/2016   Long term current use of anticoagulant 10/04/2015   Systemic lupus erythematosus ([place]) 05/30/2015   Pulmonary embolism ([place]) 05/30/2015   COPD, moderate ([place]) 06/27/2014   History of pulmonary embolism 12/11/2013    Nodule of right lung 12/11/2013   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

## 2021-09-12 ENCOUNTER — Encounter: Payer: Self-pay | Admitting: Family Medicine

## 2021-09-12 ENCOUNTER — Telehealth: Payer: Self-pay | Admitting: Family Medicine

## 2021-09-12 ENCOUNTER — Telehealth: Payer: Self-pay

## 2021-09-12 ENCOUNTER — Other Ambulatory Visit: Payer: Self-pay | Admitting: *Deleted

## 2021-09-12 DIAGNOSIS — R350 Frequency of micturition: Secondary | ICD-10-CM

## 2021-09-12 LAB — CRYPTOCOCCUS ANTIGEN, SERUM: Cryptococcus Antigen, Serum: NEGATIVE

## 2021-09-12 LAB — LEGIONELLA PNEUMOPHILA TOTAL AB: Legionella Pneumo Total Ab: <0.91 OD ratio (ref 0.00–0.90)

## 2021-09-12 NOTE — Telephone Encounter (Signed)
Raquel Sarna, from [facility], calling on behalf of pt. She states that the pt was recently discharged from the hospital. And has asked to have his [place] moved to next week instead of this week. She states that she is needing to have this approved by PCP. Please advise.      [phone]

## 2021-09-12 NOTE — Telephone Encounter (Signed)
Transition Care Management Follow-up Telephone Call Date of discharge and from where: 09/11/2021    [facility] How have you been since you were released from the hospital? Continues to be short of breath with activities. Any questions or concerns? Yes Reports problems getting follow up in MD office in a timely manner  Items Reviewed: Did the pt receive and understand the discharge instructions provided? Yes  Medications obtained and verified? Yes  Other? No  Any new allergies since your discharge? No  Dietary orders reviewed? Yes Do you have support at home? Yes   Home Care and Equipment/Supplies: Were home health services ordered? yes If so, what is the name of the agency? [facility]  Has the agency set up a time to come to the patient's home? Patient is active with outpatient PT at [facility] with patient that he can not have both. Encouraged patient to stick with outpatient PT  Were any new equipment or medical supplies ordered?  No What is the name of the medical supply agency?  Were you able to get the supplies/equipment? not applicable Do you have any questions related to the use of the equipment or supplies? No  Functional Questionnaire: (I = Independent and D = Dependent) ADLs: I  Bathing/Dressing- I  Meal Prep- I  Eating- I  Maintaining continence- I  Transferring/Ambulation- I  Managing Meds- I  Follow up appointments reviewed:  PCP Hospital f/u appt confirmed? Yes  Scheduled to see PA on 10/10/2021  Specialist Hospital f/u appt confirmed? No  Are transportation arrangements needed? No  If their condition worsens, is the pt aware to call PCP or go to the Emergency Dept.? Yes Was the patient provided with contact information for the PCP's office or ED? Yes Was to pt encouraged to call back with questions or concerns? Yes  Tomasa Rand, RN, BSN, CEN [facility] [employer] [phone]

## 2021-09-13 ENCOUNTER — Encounter: Payer: Self-pay | Admitting: Urology

## 2021-09-13 ENCOUNTER — Encounter: Payer: Self-pay | Admitting: Dermatology

## 2021-09-13 ENCOUNTER — Other Ambulatory Visit: Payer: Self-pay

## 2021-09-13 ENCOUNTER — Ambulatory Visit: Payer: 59 | Admitting: Urology

## 2021-09-13 ENCOUNTER — Other Ambulatory Visit
Admission: RE | Admit: 2021-09-13 | Discharge: 2021-09-13 | Disposition: A | Discharge status: Home / Self Care | Payer: 59 | Attending: Urology | Admitting: Urology

## 2021-09-13 VITALS — BP 104/68 | HR 72 | Ht 75.0 in | Wt 235.0 lb

## 2021-09-13 DIAGNOSIS — N529 Male erectile dysfunction, unspecified: Secondary | ICD-10-CM | POA: Insufficient documentation

## 2021-09-13 DIAGNOSIS — R350 Frequency of micturition: Secondary | ICD-10-CM

## 2021-09-13 DIAGNOSIS — L84 Corns and callosities: Secondary | ICD-10-CM

## 2021-09-13 LAB — URINALYSIS, COMPLETE (UACMP) WITH MICROSCOPIC
Bilirubin Urine: NEGATIVE
Glucose, UA: NEGATIVE mg/dL
Ketones, ur: NEGATIVE mg/dL
Leukocytes,Ua: NEGATIVE
Nitrite: NEGATIVE
Protein, ur: >300 mg/dL — AB
Specific Gravity, Urine: 1.025 (ref 1.005–1.030)
pH: 5.5 (ref 5.0–8.0)

## 2021-09-13 LAB — BLADDER SCAN AMB NON-IMAGING

## 2021-09-13 LAB — PSA: Prostatic Specific Antigen: 0.42 ng/mL (ref 0.00–4.00)

## 2021-09-13 LAB — FUNGITELL, SERUM: Fungitell Result: <31 pg/mL (ref <80)

## 2021-09-13 MED ORDER — TAMSULOSIN HCL 0.4 MG PO CAPS
0.4000 mg | ORAL_CAPSULE | Freq: Every day | ORAL | 11 refills | Status: DC
Start: 2021-09-13 — End: 2021-10-29

## 2021-09-13 MED ORDER — OXYBUTYNIN CHLORIDE ER 10 MG PO TB24: 10.0000 mg | ORAL_TABLET | Freq: Every day | ORAL | 11 refills | Status: DC

## 2021-09-13 NOTE — Progress Notes (Signed)
09/13/2021 11:14 AM   Lucienne Minks Feb 09, 1961 [id number]  Referring provider: Delsa Grana, PA-C [address] [place],  [place] [address]  Chief Complaint  Patient presents with   Urinary Frequency    HPI: 60 year old male who presents today for further evaluation of urinary issues.  Notably, he was recently just discharged from [facility] for acute kidney injury likely prerenal from nausea and vomiting.  He was last seen by urology back in 2018 for follow-up of a history of scrotal abscess and microscopic hematuria.  At that time, he underwent cystoscopy which showed an enlarged prominent prostatic utricle but otherwise negative scope.  Urinalysis today is negative other than for squamous epithelial cells.  No blood.  No recent PSA.  He reports today that since back surgery 3 months ago, he has been having difficulty with pain in his mood.  He has been overall just weak.  He also had new onset of urinary urgency frequency and nocturia.  He is not getting good sleep because of this.  He also sometimes has some urinary hesitancy.  He does feel like he is emptying his bladder.  Prior to his surgery, he had minimal urinary symptoms.  He denies any dysuria or gross hematuria.  He also mentions today that he has been struggling with erectile dysfunction since his surgery.  He had [place] no issues with achieving or maintaining erection prior to this.  He has not been able to achieve an erection since.  He worries that something is wrong.  He continues to have severe back pain.  He is wearing a brace still.  He is having difficulty ambulating.   IPSS     Row Name 09/13/21 1100         International Prostate Symptom Score   How often have you had the sensation of not emptying your bladder? About half the time     How often have you had to urinate less than every two hours? Almost always     How often have you found you stopped and started again several times when you  urinated? About half the time     How often have you found it difficult to postpone urination? Almost always     How often have you had a weak urinary stream? Less than 1 in 5 times     How often have you had to strain to start urination? Less than half the time     How many times did you typically get up at night to urinate? 4 Times     Total IPSS Score 23           Quality of Life due to urinary symptoms   If you were to spend the rest of your life with your urinary condition just the way it is now how would you feel about that? 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   Overview:  superior sagittal sinus, left transverse sinus and cortical veins    COVID-19 virus infection 12/2020   Depression    DVT (deep venous thrombosis) (HCC)    GERD (  gastroesophageal reflux disease)    Heel spur, left 02/16/2019   Heel spur, right 02/16/2019   Hyperlipidemia    Hypertension    Lupus ([place])    Lymphedema 10/07/2018   Morbid obesity ([place])    Opiate abuse, episodic ([place]) 02/26/2018   Osteoporosis    Postphlebitic syndrome with ulcer, 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   Presence of IVC filter 03/22/2020   Pulmonary embolism ([place])    Renal disorder    Stage III    Surgical History: Past Surgical History:  Procedure Laterality Date   ANKLE SURGERY Right    COLONOSCOPY WITH PROPOFOL N/A 05/28/2020   Procedure: COLONOSCOPY WITH PROPOFOL;  Surgeon: Jonathon Bellows, MD;  Location: [facility] ENDOSCOPY;  Service: Endoscopy;  Laterality: N/A;   CYST EXCISION  92 or 93    Liver cyst removal [facility]   I & D EXTREMITY Right 04/29/2017   Procedure: IRRIGATION AND DEBRIDEMENT EXTREMITY;  Surgeon: Clayburn Pert, MD;  Location: [facility]  ORS;  Service: General;  Laterality: Right;   IRRIGATION AND DEBRIDEMENT ABSCESS Left 04/29/2017   Procedure: IRRIGATION AND DEBRIDEMENT Scrotal ABSCESS;  Surgeon: Clayburn Pert, MD;  Location: [facility] ORS;  Service: General;  Laterality: Left;    Home Medications:  Allergies as of 09/13/2021       Reactions   Enalapril Other (See Comments)   Vicodin [hydrocodone-acetaminophen] Hives, Rash   Severe headaches (also)        Medication List        Accurate as of September 13, 2021 11:14 AM. If you have any questions, ask your nurse or doctor.          STOP taking these medications    nystatin powder Commonly known as: MYCOSTATIN/NYSTOP Stopped by: Hollice Espy, MD   ondansetron 4 MG tablet Commonly known as: ZOFRAN Stopped by: Hollice Espy, MD   Pfizer-BioNT COVID-19 Vac-TriS Susp injection Generic drug: COVID-19 mRNA Vac-TriS Therapist, music) Stopped by: Hollice Espy, MD   Pfizer-BioNTech COVID-19 Vacc 30 MCG/0.3ML injection Generic drug: COVID-19 mRNA vaccine Therapist, music) Stopped by: Hollice Espy, MD   sulfamethoxazole-trimethoprim 400-80 MG tablet Commonly known as: BACTRIM Stopped by: Hollice Espy, MD   triamcinolone ointment 0.1 % Commonly known as: KENALOG Stopped by: Hollice Espy, MD       TAKE these medications    albuterol 108 (90 Base) MCG/ACT inhaler Commonly known as: VENTOLIN HFA Inhale 2 puffs into the lungs every 6 (six) hours as needed for wheezing or shortness of breath.   amLODipine 5 MG tablet Commonly known as: NORVASC Take 1 tablet (5 mg total) by mouth daily.   apixaban 5 MG Tabs tablet Commonly known as: Eliquis Take 1 tablet (5 mg total) by mouth 2 (two) times daily.   cholecalciferol 25 MCG (1000 UNIT) tablet Commonly known as: VITAMIN D3 Take 1,000 Units by mouth daily.   gabapentin 300 MG capsule Commonly known as: NEURONTIN Take 300 mg by mouth 3 (three) times daily.   HYDROcodone-acetaminophen 10-325 MG  tablet Commonly known as: NORCO   hydrOXYzine 10 MG tablet Commonly known as: ATARAX/VISTARIL Take 1-2 tablets (10-20 mg total) by mouth 3 (three) times daily as needed.   ipratropium-albuterol 0.5-2.5 (3) MG/3ML Soln Commonly known as: DUONEB Take 3 mLs by nebulization every 4 (four) hours as needed.   loratadine 10 MG tablet Commonly known as: CLARITIN Take 1 tablet (10 mg total) by mouth daily.   losartan 100 MG tablet Commonly known as: COZAAR Take 1 tablet (100 mg total) by mouth daily.   montelukast  10 MG tablet Commonly known as: SINGULAIR Take 1 tablet (10 mg total) by mouth at bedtime.   multivitamin with minerals tablet Take 1 tablet by mouth daily.   mupirocin ointment 2 % Commonly known as: BACTROBAN Apply 1 application topically daily. Until healed at bottom of right foot   mycophenolate 500 MG tablet Commonly known as: CELLCEPT Take 1,000 mg by mouth 2 (two) times daily.   Nucala 100 MG/ML [place] Generic drug: Mepolizumab Inject into the skin.   nystatin-triamcinolone ointment Commonly known as: MYCOLOG Apply 1 application topically 2 (two) times daily.   predniSONE 10 MG tablet Commonly known as: DELTASONE Take 10 mg by mouth daily with breakfast.   rosuvastatin 20 MG tablet Commonly known as: Crestor Take one pill every other night before bed.        Allergies:  Allergies  Allergen Reactions   Enalapril Other (See Comments)   Vicodin [Hydrocodone-Acetaminophen] Hives and Rash    Severe headaches (also)    Family History: Family History  Problem Relation Age of Onset   Hypertension Father    Heart disease Father    Clotting disorder Mother    Kidney disease Brother    Heart attack Maternal Grandmother    Heart attack Maternal Grandfather    Heart attack Paternal Grandfather     Social History:  reports that he has never smoked. He has never used smokeless tobacco. He reports that he does not drink alcohol and does not use  drugs.   Physical Exam: BP 104/68   Pulse 72   Ht 6\' 3"  (1.905 m)   Wt 235 lb (106.6 kg)   BMI 29.37 kg/m   Constitutional:  Alert and oriented, No acute distress.  Wearing back brace, walking slowly appears to be uncomfortable. HEENT: [place] AT, moist mucus membranes.  Trachea midline, no masses. Cardiovascular: No clubbing, cyanosis, or edema. Respiratory: Normal respiratory effort, no increased work of breathing. Rectal: Normal sphincter tone.  Small 30 cc prostate, nontender no nodules. Skin: No rashes, bruises or suspicious lesions. Neurologic: Grossly intact, no focal deficits, moving all 4 extremities. Psychiatric: Normal mood and affect.  Laboratory Data: Lab Results  Component Value Date   WBC 5.2 09/10/2021   HGB 11.8 (L) 09/10/2021   HCT 36.7 (L) 09/10/2021   MCV 88.9 09/10/2021   PLT 187 09/10/2021    Lab Results  Component Value Date   CREATININE 1.96 (H) 09/10/2021    Lab Results  Component Value Date   HGBA1C 5.8 (H) 03/19/2021    Urinalysis Component     Latest Ref Rng & Units 09/13/2021  Color, Urine     YELLOW YELLOW  Appearance     CLEAR HAZY (A)  Specific Gravity, Urine     1.005 - 1.030 1.025  pH     5.0 - 8.0 5.5  Glucose, UA     NEGATIVE mg/dL NEGATIVE  Bilirubin Urine     NEGATIVE NEGATIVE  Ketones, ur     NEGATIVE mg/dL NEGATIVE  Hgb urine dipstick     NEGATIVE SMALL (A)  Protein     NEGATIVE mg/dL >300 (A)  Nitrite     NEGATIVE NEGATIVE  Leukocytes,Ua     NEGATIVE NEGATIVE  WBC, UA     0 - 5 WBC/hpf 0-5  RBC / HPF     0 - 5 RBC/hpf 0-5  Squamous Epithelial / LPF     0 - 5 6-10  Bacteria, UA     NONE SEEN FEW (  A)    Assessment & Plan:    1. Urinary frequency Worse urinary frequency urgency question whether this is prostate irritation related to his catheter and were bladder overactivity from fluids and/or neurological changes from his back surgery  Regardless, we will plan to treat him symptomatically.  We will try  Flomax and oxybutynin 10 mg XL concomitantly at least for the short-term to see if it helps improve.  We may not need to keep him on his medications indefinitely as he recovers from his back surgery.  Additionally today, we have updated his PSA screening and rectal exam today. - BLADDER SCAN AMB NON-IMAGING - PSA; Future  2. Erectile dysfunction, unspecified erectile dysfunction type Discussed in light of his recent surgery as well as acute onset erectile dysfunction, suspect this is likely situational with probable psychological and physiologic pain component contributing to his functional status  We discussed the option for trial of sildenafil and he like to hold off for the time being to see if this improves with pain control.  Support this decision.  We will readdress at his next follow-up. - PSA; Future   Fu 6 with IPSS/ PVR  Hollice Espy, MD  [facility] [address] [place], [place] [address] [phone]

## 2021-09-13 NOTE — Telephone Encounter (Signed)
Spoke with Raquel Sarna and gave verbal okay.

## 2021-09-14 LAB — ASPERGILLUS ANTIGEN, BAL/SERUM: Aspergillus Ag, BAL/Serum: 0.04 Index (ref 0.00–0.49)

## 2021-09-15 LAB — CULTURE, BLOOD (ROUTINE X 2)
Culture: NO GROWTH
Culture: NO GROWTH
Special Requests: ADEQUATE
Special Requests: ADEQUATE

## 2021-09-16 ENCOUNTER — Telehealth: Payer: Self-pay | Admitting: Family Medicine

## 2021-09-16 ENCOUNTER — Telehealth: Payer: Self-pay

## 2021-09-16 DIAGNOSIS — E785 Hyperlipidemia, unspecified: Secondary | ICD-10-CM

## 2021-09-16 NOTE — Telephone Encounter (Signed)
[facility] faxed refill request for the following medications:   rosuvastatin (CRESTOR) 20 MG tablet   Please advise.

## 2021-09-16 NOTE — Telephone Encounter (Signed)
Please schedule this pt this week for ER f/u with [facility] or [facility] for reoccurring SOB and cancel appt for 10/20

## 2021-09-17 ENCOUNTER — Ambulatory Visit: Payer: 59 | Admitting: Family Medicine

## 2021-09-17 ENCOUNTER — Other Ambulatory Visit: Payer: Self-pay | Admitting: Family Medicine

## 2021-09-17 MED ORDER — ROSUVASTATIN CALCIUM 20 MG PO TABS: ORAL_TABLET | ORAL | 0 refills | Status: DC

## 2021-09-17 NOTE — Telephone Encounter (Signed)
Pt has an appt for tomorrow on 09/18/21 with you

## 2021-09-18 ENCOUNTER — Other Ambulatory Visit: Payer: Self-pay

## 2021-09-18 ENCOUNTER — Ambulatory Visit: Payer: 59 | Admitting: Family Medicine

## 2021-09-18 ENCOUNTER — Telehealth: Payer: Self-pay

## 2021-09-18 ENCOUNTER — Encounter: Payer: Self-pay | Admitting: Family Medicine

## 2021-09-18 ENCOUNTER — Encounter: Payer: Self-pay | Admitting: Urology

## 2021-09-18 VITALS — BP 116/78 | HR 84 | Temp 98.3°F | Resp 20 | Ht 75.0 in | Wt 237.4 lb

## 2021-09-18 DIAGNOSIS — Z79899 Other long term (current) drug therapy: Secondary | ICD-10-CM

## 2021-09-18 DIAGNOSIS — D649 Anemia, unspecified: Secondary | ICD-10-CM

## 2021-09-18 DIAGNOSIS — J449 Chronic obstructive pulmonary disease, unspecified: Secondary | ICD-10-CM | POA: Diagnosis not present

## 2021-09-18 DIAGNOSIS — N1831 Chronic kidney disease, stage 3a: Secondary | ICD-10-CM

## 2021-09-18 DIAGNOSIS — I10 Essential (primary) hypertension: Secondary | ICD-10-CM

## 2021-09-18 DIAGNOSIS — N179 Acute kidney failure, unspecified: Secondary | ICD-10-CM

## 2021-09-18 DIAGNOSIS — Z09 Encounter for follow-up examination after completed treatment for conditions other than malignant neoplasm: Secondary | ICD-10-CM | POA: Diagnosis not present

## 2021-09-18 DIAGNOSIS — D84821 Immunodeficiency due to drugs: Secondary | ICD-10-CM

## 2021-09-18 DIAGNOSIS — F324 Major depressive disorder, single episode, in partial remission: Secondary | ICD-10-CM

## 2021-09-18 DIAGNOSIS — E785 Hyperlipidemia, unspecified: Secondary | ICD-10-CM

## 2021-09-18 DIAGNOSIS — R6889 Other general symptoms and signs: Secondary | ICD-10-CM

## 2021-09-18 DIAGNOSIS — Z7901 Long term (current) use of anticoagulants: Secondary | ICD-10-CM | POA: Diagnosis not present

## 2021-09-18 DIAGNOSIS — R000 Tachycardia, unspecified: Secondary | ICD-10-CM

## 2021-09-18 DIAGNOSIS — D692 Other nonthrombocytopenic purpura: Secondary | ICD-10-CM

## 2021-09-18 DIAGNOSIS — R0602 Shortness of breath: Secondary | ICD-10-CM | POA: Diagnosis not present

## 2021-09-18 DIAGNOSIS — R29898 Other symptoms and signs involving the musculoskeletal system: Secondary | ICD-10-CM

## 2021-09-18 DIAGNOSIS — R002 Palpitations: Secondary | ICD-10-CM

## 2021-09-18 DIAGNOSIS — R0601 Orthopnea: Secondary | ICD-10-CM

## 2021-09-18 DIAGNOSIS — G252 Other specified forms of tremor: Secondary | ICD-10-CM

## 2021-09-18 DIAGNOSIS — R531 Weakness: Secondary | ICD-10-CM

## 2021-09-18 DIAGNOSIS — R1312 Dysphagia, oropharyngeal phase: Secondary | ICD-10-CM

## 2021-09-18 DIAGNOSIS — E782 Mixed hyperlipidemia: Secondary | ICD-10-CM

## 2021-09-18 DIAGNOSIS — I5189 Other ill-defined heart diseases: Secondary | ICD-10-CM

## 2021-09-18 DIAGNOSIS — R2689 Other abnormalities of gait and mobility: Secondary | ICD-10-CM

## 2021-09-18 DIAGNOSIS — R7303 Prediabetes: Secondary | ICD-10-CM

## 2021-09-18 DIAGNOSIS — M329 Systemic lupus erythematosus, unspecified: Secondary | ICD-10-CM

## 2021-09-18 NOTE — Telephone Encounter (Signed)
Advised pt of bx results.  Pt wanted to know if we were sending in a referral for podiatry.  I advised I would talk to Dr. Nehemiah Massed and let him know./sh

## 2021-09-18 NOTE — Telephone Encounter (Signed)
-----   Message from Ralene Bathe, MD sent at 09/13/2021  6:51 PM EDT ----- Diagnosis Skin , right medial anterior sole HYPERKERATOTIC MATERIAL, SUPERFICIAL BIOPSY, SEE DESCRIPTION  Benign thickened skin  Most consistent with callous May recur May need further treatment

## 2021-09-18 NOTE — Patient Instructions (Signed)
Recent Results (from the past 2160 hour(s))  POCT urinalysis dipstick     Status: Normal   Collection Time: 07/23/21  2:23 PM  Result Value Ref Range   Color, UA yellow    Clarity, UA clear    Glucose, UA Negative Negative   Bilirubin, UA neg    Ketones, UA neg    Spec Grav, UA 1.015 1.010 - 1.025   Blood, UA neg    pH, UA 6.0 5.0 - 8.0   Protein, UA Negative Negative   Urobilinogen, UA 0.2 0.2 or 1.0 E.U./dL   Nitrite, UA neg    Leukocytes, UA Negative Negative   Appearance clear    Odor normal   Urine Culture     Status: None   Collection Time: 07/23/21  3:22 PM   Specimen: Urine  Result Value Ref Range   MICRO NUMBER: [id number]    SPECIMEN QUALITY: Adequate    Sample Source URINE    STATUS: FINAL    Result: No Growth   CBC     Status: None   Collection Time: 08/22/21  1:57 PM  Result Value Ref Range   WBC 7.1 4.0 - 10.5 K/uL   RBC 4.61 4.22 - 5.81 MIL/uL   Hemoglobin 13.1 13.0 - 17.0 g/dL   HCT 39.9 39.0 - 52.0 %   MCV 86.6 80.0 - 100.0 fL   MCH 28.4 26.0 - 34.0 pg   MCHC 32.8 30.0 - 36.0 g/dL   RDW 15.2 11.5 - 15.5 %   Platelets 271 150 - 400 K/uL   nRBC 0.0 0.0 - 0.2 %    Comment: Performed at [facility], [address] [place], [place] [address]  Comprehensive metabolic panel     Status: Abnormal   Collection Time: 08/22/21  1:57 PM  Result Value Ref Range   Sodium 138 135 - 145 mmol/L   Potassium 3.8 3.5 - 5.1 mmol/L   Chloride 107 98 - 111 mmol/L   CO2 21 (L) 22 - 32 mmol/L   Glucose, Bld 106 (H) 70 - 99 mg/dL    Comment: Glucose reference range applies only to samples taken after fasting for at least 8 hours.   BUN 25 (H) 6 - 20 mg/dL   Creatinine, Ser 1.32 (H) 0.61 - 1.24 mg/dL   Calcium 9.5 8.9 - 10.3 mg/dL   Total Protein 7.4 6.5 - 8.1 g/dL   Albumin 3.7 3.5 - 5.0 g/dL   AST 19 15 - 41 U/L   ALT 17 0 - 44 U/L   Alkaline Phosphatase 90 38 - 126 U/L   Total Bilirubin 1.1 0.3 - 1.2 mg/dL   GFR, Estimated >60 >60 mL/min    Comment:  (NOTE) Calculated using the CKD-EPI Creatinine Equation (2021)    Anion gap 10 5 - 15    Comment: Performed at [facility], [address] [place], [name] [address]  Lactic acid, plasma     Status: None   Collection Time: 08/22/21  1:57 PM  Result Value Ref Range   Lactic Acid, Venous 1.3 0.5 - 1.9 mmol/L    Comment: Performed at [facility], [place], [place] [address]  Troponin I (High Sensitivity)     Status: None   Collection Time: 08/22/21  1:57 PM  Result Value Ref Range   Troponin I (High Sensitivity) 8 <18 ng/L    Comment: (NOTE) Elevated high sensitivity troponin I (hsTnI) values and significant  changes across serial  measurements may suggest ACS but many other  chronic and acute conditions are known to elevate hsTnI results.  Refer to the "Links" section for chest pain algorithms and additional  guidance. Performed at [facility], [address] [place], [place] [address]   Procalcitonin - Baseline     Status: None   Collection Time: 08/22/21  1:57 PM  Result Value Ref Range   Procalcitonin <0.10 ng/mL    Comment:        Interpretation: PCT (Procalcitonin) <= 0.5 ng/mL: Systemic infection (sepsis) is not likely. Local bacterial infection is possible. (NOTE)       Sepsis PCT Algorithm           Lower Respiratory Tract                                      Infection PCT Algorithm    ----------------------------     ----------------------------         PCT < 0.25 ng/mL                PCT < 0.10 ng/mL          Strongly encourage             Strongly discourage   discontinuation of antibiotics    initiation of antibiotics    ----------------------------     -----------------------------       PCT 0.25 - 0.50 ng/mL            PCT 0.10 - 0.25 ng/mL               OR       >80% decrease in PCT            Discourage initiation of                                            antibiotics      Encourage discontinuation            of antibiotics    ----------------------------     -----------------------------         PCT >= 0.50 ng/mL              PCT 0.26 - 0.50 ng/mL               AND        <80% decrease in PCT             Encourage initiation of                                             antibiotics       Encourage continuation           of antibiotics    ----------------------------     -----------------------------        PCT >= 0.50 ng/mL                  PCT > 0.50 ng/mL               AND         increase in PCT  Strongly encourage                                      initiation of antibiotics    Strongly encourage escalation           of antibiotics                                     -----------------------------                                           PCT <= 0.25 ng/mL                                                 OR                                        > 80% decrease in PCT                                      Discontinue / Do not initiate                                             antibiotics  Performed at [facility], [address] [place], [place] [address]   Resp Panel by RT-PCR (Flu A&B, Covid) Nasopharyngeal Swab     Status: None   Collection Time: 08/22/21  4:03 PM   Specimen: Nasopharyngeal Swab; Nasopharyngeal(NP) swabs in vial transport medium  Result Value Ref Range   SARS Coronavirus 2 by RT PCR NEGATIVE NEGATIVE    Comment: (NOTE) SARS-CoV-2 target nucleic acids are NOT DETECTED.  The SARS-CoV-2 RNA is generally detectable in upper respiratory specimens during the acute phase of infection. The lowest concentration of SARS-CoV-2 viral copies this assay can detect is 138 copies/mL. A negative result does not preclude SARS-Cov-2 infection and should not be used as the sole basis for treatment or other patient management decisions. A negative result may occur with  improper specimen collection/handling, submission of specimen other than  nasopharyngeal swab, presence of viral mutation(s) within the areas targeted by this assay, and inadequate number of viral copies(<138 copies/mL). A negative result must be combined with clinical observations, patient history, and epidemiological information. The expected result is Negative.  Fact Sheet for Patients:  EntrepreneurPulse.com.au  Fact Sheet for Healthcare Providers:  IncredibleEmployment.be  This test is no t yet approved or cleared by the [place] FDA and  has been authorized for detection and/or diagnosis of SARS-CoV-2 by FDA under an Emergency Use Authorization (EUA). This EUA will remain  in effect (meaning this test can be used) for the duration of the COVID-19 declaration under Section 564(b)(1) of the Act, 21 U.S.C.section 360bbb-3(b)(1), unless the authorization is terminated  or revoked sooner.  Influenza A by PCR NEGATIVE NEGATIVE   Influenza B by PCR NEGATIVE NEGATIVE    Comment: (NOTE) The Xpert Xpress SARS-CoV-2/FLU/RSV plus assay is intended as an aid in the diagnosis of influenza from Nasopharyngeal swab specimens and should not be used as a sole basis for treatment. Nasal washings and aspirates are unacceptable for Xpert Xpress SARS-CoV-2/FLU/RSV testing.  Fact Sheet for Patients: EntrepreneurPulse.com.au  Fact Sheet for Healthcare Providers: IncredibleEmployment.be  This test is not yet approved or cleared by the [place] FDA and has been authorized for detection and/or diagnosis of SARS-CoV-2 by FDA under an Emergency Use Authorization (EUA). This EUA will remain in effect (meaning this test can be used) for the duration of the COVID-19 declaration under Section 564(b)(1) of the Act, 21 U.S.C. section 360bbb-3(b)(1), unless the authorization is terminated or revoked.  Performed at [facility], [address] [place], [place] [address]    Comprehensive metabolic panel     Status: Abnormal   Collection Time: 09/09/21  5:34 PM  Result Value Ref Range   Sodium 137 135 - 145 mmol/L   Potassium 4.5 3.5 - 5.1 mmol/L   Chloride 107 98 - 111 mmol/L   CO2 21 (L) 22 - 32 mmol/L   Glucose, Bld 100 (H) 70 - 99 mg/dL    Comment: Glucose reference range applies only to samples taken after fasting for at least 8 hours.   BUN 32 (H) 6 - 20 mg/dL   Creatinine, Ser 2.41 (H) 0.61 - 1.24 mg/dL   Calcium 9.5 8.9 - 10.3 mg/dL   Total Protein 7.7 6.5 - 8.1 g/dL   Albumin 3.9 3.5 - 5.0 g/dL   AST 21 15 - 41 U/L   ALT 19 0 - 44 U/L   Alkaline Phosphatase 93 38 - 126 U/L   Total Bilirubin 0.8 0.3 - 1.2 mg/dL   GFR, Estimated 30 (L) >60 mL/min    Comment: (NOTE) Calculated using the CKD-EPI Creatinine Equation (2021)    Anion gap 9 5 - 15    Comment: Performed at [facility], [address] [place], [place] [address]  CBC with Differential     Status: Abnormal   Collection Time: 09/09/21  5:34 PM  Result Value Ref Range   WBC 7.5 4.0 - 10.5 K/uL   RBC 4.71 4.22 - 5.81 MIL/uL   Hemoglobin 13.6 13.0 - 17.0 g/dL   HCT 42.2 39.0 - 52.0 %   MCV 89.6 80.0 - 100.0 fL   MCH 28.9 26.0 - 34.0 pg   MCHC 32.2 30.0 - 36.0 g/dL   RDW 15.4 11.5 - 15.5 %   Platelets 239 150 - 400 K/uL   nRBC 0.0 0.0 - 0.2 %   Neutrophils Relative % 69 %   Neutro Abs 5.1 1.7 - 7.7 K/uL   Lymphocytes Relative 17 %   Lymphs Abs 1.3 0.7 - 4.0 K/uL   Monocytes Relative 14 %   Monocytes Absolute 1.1 (H) 0.1 - 1.0 K/uL   Eosinophils Relative 0 %   Eosinophils Absolute 0.0 0.0 - 0.5 K/uL   Basophils Relative 0 %   Basophils Absolute 0.0 0.0 - 0.1 K/uL   Immature Granulocytes 0 %   Abs Immature Granulocytes 0.02 0.00 - 0.07 K/uL    Comment: Performed at [facility], [address] [place], [place] [address]  Troponin I (High Sensitivity)     Status: None   Collection Time: 09/09/21  5:34 PM  Result Value Ref  Range   Troponin I (High  Sensitivity) 5 <18 ng/L    Comment: (NOTE) Elevated high sensitivity troponin I (hsTnI) values and significant  changes across serial measurements may suggest ACS but many other  chronic and acute conditions are known to elevate hsTnI results.  Refer to the "Links" section for chest pain algorithms and additional  guidance. Performed at [facility], [address] [place], [place] [address]   Resp Panel by RT-PCR (Flu A&B, Covid) Nasopharyngeal Swab     Status: None   Collection Time: 09/09/21  5:40 PM   Specimen: Nasopharyngeal Swab; Nasopharyngeal(NP) swabs in vial transport medium  Result Value Ref Range   SARS Coronavirus 2 by RT PCR NEGATIVE NEGATIVE    Comment: (NOTE) SARS-CoV-2 target nucleic acids are NOT DETECTED.  The SARS-CoV-2 RNA is generally detectable in upper respiratory specimens during the acute phase of infection. The lowest concentration of SARS-CoV-2 viral copies this assay can detect is 138 copies/mL. A negative result does not preclude SARS-Cov-2 infection and should not be used as the sole basis for treatment or other patient management decisions. A negative result may occur with  improper specimen collection/handling, submission of specimen other than nasopharyngeal swab, presence of viral mutation(s) within the areas targeted by this assay, and inadequate number of viral copies(<138 copies/mL). A negative result must be combined with clinical observations, patient history, and epidemiological information. The expected result is Negative.  Fact Sheet for Patients:  EntrepreneurPulse.com.au  Fact Sheet for Healthcare Providers:  IncredibleEmployment.be  This test is no t yet approved or cleared by the [place] FDA and  has been authorized for detection and/or diagnosis of SARS-CoV-2 by FDA under an Emergency Use Authorization (EUA). This EUA will remain  in effect (meaning this test can be used) for  the duration of the COVID-19 declaration under Section 564(b)(1) of the Act, 21 U.S.C.section 360bbb-3(b)(1), unless the authorization is terminated  or revoked sooner.       Influenza A by PCR NEGATIVE NEGATIVE   Influenza B by PCR NEGATIVE NEGATIVE    Comment: (NOTE) The Xpert Xpress SARS-CoV-2/FLU/RSV plus assay is intended as an aid in the diagnosis of influenza from Nasopharyngeal swab specimens and should not be used as a sole basis for treatment. Nasal washings and aspirates are unacceptable for Xpert Xpress SARS-CoV-2/FLU/RSV testing.  Fact Sheet for Patients: EntrepreneurPulse.com.au  Fact Sheet for Healthcare Providers: IncredibleEmployment.be  This test is not yet approved or cleared by the [place] FDA and has been authorized for detection and/or diagnosis of SARS-CoV-2 by FDA under an Emergency Use Authorization (EUA). This EUA will remain in effect (meaning this test can be used) for the duration of the COVID-19 declaration under Section 564(b)(1) of the Act, 21 U.S.C. section 360bbb-3(b)(1), unless the authorization is terminated or revoked.  Performed at [facility] [place], [place] [address]   Troponin I (High Sensitivity)     Status: None   Collection Time: 09/09/21 10:34 PM  Result Value Ref Range   Troponin I (High Sensitivity) 6 <18 ng/L    Comment: (NOTE) Elevated high sensitivity troponin I (hsTnI) values and significant  changes across serial measurements may suggest ACS but many other  chronic and acute conditions are known to elevate hsTnI results.  Refer to the "Links" section for chest pain algorithms and additional  guidance. Performed at [facility], [address] [place], [place] [address]   HIV Antibody (routine testing w rflx)  Status: None   Collection Time: 09/10/21 10:52 AM  Result Value Ref Range   HIV Screen 4th Generation wRfx Non Reactive Non  Reactive    Comment: Performed at [facility], [place] [address], [place], [place] [address]  CBC     Status: Abnormal   Collection Time: 09/10/21 10:52 AM  Result Value Ref Range   WBC 5.2 4.0 - 10.5 K/uL   RBC 4.13 (L) 4.22 - 5.81 MIL/uL   Hemoglobin 11.8 (L) 13.0 - 17.0 g/dL   HCT 36.7 (L) 39.0 - 52.0 %   MCV 88.9 80.0 - 100.0 fL   MCH 28.6 26.0 - 34.0 pg   MCHC 32.2 30.0 - 36.0 g/dL   RDW 15.4 11.5 - 15.5 %   Platelets 187 150 - 400 K/uL   nRBC 0.0 0.0 - 0.2 %    Comment: Performed at [facility], [address], [place], [place] [address]  Basic metabolic panel     Status: Abnormal   Collection Time: 09/10/21 10:52 AM  Result Value Ref Range   Sodium 138 135 - 145 mmol/L   Potassium 3.8 3.5 - 5.1 mmol/L   Chloride 110 98 - 111 mmol/L   CO2 21 (L) 22 - 32 mmol/L   Glucose, Bld 107 (H) 70 - 99 mg/dL    Comment: Glucose reference range applies only to samples taken after fasting for at least 8 hours.   BUN 29 (H) 6 - 20 mg/dL   Creatinine, Ser 1.96 (H) 0.61 - 1.24 mg/dL   Calcium 8.6 (L) 8.9 - 10.3 mg/dL   GFR, Estimated 39 (L) >60 mL/min    Comment: (NOTE) Calculated using the CKD-EPI Creatinine Equation (2021)    Anion gap 7 5 - 15    Comment: Performed at [facility], [address] [place], [place] [address]  Legionella pneumophila Total Ab     Status: None   Collection Time: 09/10/21  6:31 PM  Result Value Ref Range   Legionella Pneumo Total Ab <0.91 0.00 - 0.90 OD ratio    Comment: (NOTE)                               Negative          <0.91                               Equivocal   0.91 - 1.09                               Positive          >1.09          This assay detects IgG/IgM/IgA antibodies to          L. pneumophila Groups 1-6 by the EIA method. Performed At: [facility] [address] [place], [place] [id number] Rush Farmer MD [id number]   Fungitell, Serum     Status: None   Collection Time: 09/10/21  6:31 PM   Result Value Ref Range   Fungitell Result <31 <80 pg/mL    Comment: (NOTE) Interpretation: The Fungitell assay does not detect certain fungal species such as the genus Cryptococcus Baldomero Lamy et al. [phone]) which produces very low levels of (1-3)-Beta-D-Glucan. The assay also does not detect the Zygomycetes such as Absidia, Mucor and  Rhizopus (Mitsuya et al. [phone]) which are not known to produce (1-3)-Beta-D-Glucan. In addition, the yeast phase of Blastomyces dermatitidis produces little (1-3)-Beta-D-Glucan and may not be detected by the assay Charise Carwin et al. 2007). Reference Range: Less than 60 pg/mL. Glucan values of less than 60 pg/mL are interpreted as negative. Glucan values of 60 to 79 pg/mL are interpreted as indeterminate, and suggest a possible fungal infection. Additional sampling and testing of sera is required to interpret the results. Glucan values of greater than or equal to 80 pg/mL are interpreted as positive. Due to the potential for environmental contamination when transferred to pour-off tubes, which can lead to false  positive results, interpret positive results from samples provided in pour-off tubes with caution.  Results should be used in conjunction with clinical findings, and should not form the sole basis for a diagnosis or treatment decision. The Fungitell test is approved or cleared for in vitro diagnostic use by the [place] and Drug Administration. Modifications to the approved package insert have been made and the performance characteristics for these modifications were determined by [facility]. If sample result is greater than 500 pg/mL, physician may order a titer of the sample. Please contact [facility] if you would like to order a retest of this sample to obtain an actual value. Samples are held for 1 week after initial testing date. Performed At: [facility] [address] [place], [place] [id number] Carlean Purl R PhD  XN:[id number]   Procalcitonin - Baseline     Status: None   Collection Time: 09/10/21  6:31 PM  Result Value Ref Range   Procalcitonin <0.10 ng/mL    Comment:        Interpretation: PCT (Procalcitonin) <= 0.5 ng/mL: Systemic infection (sepsis) is not likely. Local bacterial infection is possible. (NOTE)       Sepsis PCT Algorithm           Lower Respiratory Tract                                      Infection PCT Algorithm    ----------------------------     ----------------------------         PCT < 0.25 ng/mL                PCT < 0.10 ng/mL          Strongly encourage             Strongly discourage   discontinuation of antibiotics    initiation of antibiotics    ----------------------------     -----------------------------       PCT 0.25 - 0.50 ng/mL            PCT 0.10 - 0.25 ng/mL               OR       >80% decrease in PCT            Discourage initiation of                                            antibiotics      Encourage discontinuation           of antibiotics    ----------------------------     -----------------------------  PCT >= 0.50 ng/mL              PCT 0.26 - 0.50 ng/mL               AND        <80% decrease in PCT             Encourage initiation of                                             antibiotics       Encourage continuation           of antibiotics    ----------------------------     -----------------------------        PCT >= 0.50 ng/mL                  PCT > 0.50 ng/mL               AND         increase in PCT                  Strongly encourage                                      initiation of antibiotics    Strongly encourage escalation           of antibiotics                                     -----------------------------                                           PCT <= 0.25 ng/mL                                                 OR                                        > 80% decrease in PCT                                       Discontinue / Do not initiate                                             antibiotics  Performed at [facility], [address] [place], [place] [address]   Cryptococcus Antigen, Serum     Status: None   Collection Time: 09/10/21  6:31 PM  Result Value Ref Range   Cryptococcus Antigen, Serum Negative Negative    Comment: (NOTE) Performed At: [facility] [address] [place], [place] [id number] Rush Farmer MD [id number]  Histoplasma Gal[place]annan Ag Ser     Status: None (Preliminary result)   Collection Time: 09/10/21  6:31 PM  Result Value Ref Range   Histoplasma Gal'mannan Ag Ser <0.5 <0.5 ng/mL    Comment: (NOTE) This test was developed and its performance characteristics determined by [facility]. It has not been cleared or approved by the Food and Drug Administration. Performed At: [facility] [place], [place] [id number] Rush Farmer MD [id number]    Disclaimer: PENDING   C-reactive protein     Status: None   Collection Time: 09/10/21  6:31 PM  Result Value Ref Range   CRP 0.8 <1.0 mg/dL    Comment: Performed at [facility], [address] [place], [place] [address]  Ferritin     Status: None   Collection Time: 09/10/21  6:31 PM  Result Value Ref Range   Ferritin 50 24 - 336 ng/mL    Comment: Performed at [facility], [address] [place], [place] [address]  Sedimentation rate     Status: Abnormal   Collection Time: 09/10/21  6:31 PM  Result Value Ref Range   Sed Rate 25 (H) 0 - 20 mm/hr    Comment: Performed at [facility], [address] [place], [place] [address]  Brain natriuretic peptide     Status: None   Collection Time: 09/10/21  6:31 PM  Result Value Ref Range   B Natriuretic Peptide 38.8 0.0 - 100.0 pg/mL    Comment: Performed at [facility], [address] [place], [place] [address]  Aspergillus Ag, BAL/Serum     Status: None   Collection Time: 09/10/21   6:31 PM  Result Value Ref Range   Aspergillus Ag, BAL/Serum 0.04 0.00 - 0.49 Index    Comment: (NOTE) Performed At: [facility] [place] [address] [place], [place] [id number] Rush Farmer MD [id number]   CULTURE, BLOOD (ROUTINE X 2) w Reflex to ID Panel     Status: None   Collection Time: 09/10/21  6:41 PM   Specimen: BLOOD  Result Value Ref Range   Specimen Description BLOOD RIGHT ANTECUBITAL    Special Requests      BOTTLES DRAWN AEROBIC AND ANAEROBIC Blood Culture adequate volume   Culture      NO GROWTH 5 DAYS Performed at [facility], [address] [place], [place] [address]    Report Status 09/15/2021 FINAL   CULTURE, BLOOD (ROUTINE X 2) w Reflex to ID Panel     Status: None   Collection Time: 09/10/21  6:41 PM   Specimen: BLOOD  Result Value Ref Range   Specimen Description BLOOD BLOOD RIGHT HAND    Special Requests      BOTTLES DRAWN AEROBIC AND ANAEROBIC Blood Culture adequate volume   Culture      NO GROWTH 5 DAYS Performed at [facility], [address], [place], [place] [address]    Report Status 09/15/2021 FINAL   Strep pneumoniae urinary antigen     Status: None   Collection Time: 09/10/21  6:52 PM  Result Value Ref Range   Strep Pneumo Urinary Antigen NEGATIVE NEGATIVE    Comment:        Infection due to S. pneumoniae cannot be absolutely ruled out since the antigen present may be below the detection limit of the test. Performed at [facility], [place] [address] [place], [place] [address]   ECHOCARDIOGRAM COMPLETE     Status: None  Collection Time: 09/10/21  9:19 PM  Result Value Ref Range   BP 147/97 mmHg   S' Lateral 3.40 cm   Area-P 1/2 2.95 cm2  MRSA Next Gen by PCR, Nasal     Status: None   Collection Time: 09/11/21 10:35 AM   Specimen: Nasopharyngeal Swab; Nasal Swab  Result Value Ref Range   MRSA by PCR Next Gen NOT DETECTED NOT DETECTED    Comment: (NOTE) The GeneXpert MRSA Assay (FDA approved for  NASAL specimens only), is one component of a comprehensive MRSA colonization surveillance program. It is not intended to diagnose MRSA infection nor to guide or monitor treatment for MRSA infections. Test performance is not FDA approved in patients less than 54 years old. Performed at [facility], [address] [place], [place] [address]   Urinalysis, Complete w Microscopic     Status: Abnormal   Collection Time: 09/13/21 10:10 AM  Result Value Ref Range   Color, Urine YELLOW YELLOW    Comment: LESS THAN 10 mL OF URINE SUBMITTED   APPearance HAZY (A) CLEAR   Specific Gravity, Urine 1.025 1.005 - 1.030   pH 5.5 5.0 - 8.0   Glucose, UA NEGATIVE NEGATIVE mg/dL   Hgb urine dipstick SMALL (A) NEGATIVE   Bilirubin Urine NEGATIVE NEGATIVE   Ketones, ur NEGATIVE NEGATIVE mg/dL   Protein, ur >300 (A) NEGATIVE mg/dL   Nitrite NEGATIVE NEGATIVE   Leukocytes,Ua NEGATIVE NEGATIVE   Squamous Epithelial / LPF 6-10 0 - 5   WBC, UA 0-5 0 - 5 WBC/hpf   RBC / HPF 0-5 0 - 5 RBC/hpf   Bacteria, UA FEW (A) NONE SEEN    Comment: Performed at [facility] [facility], [address], [place], [place] [address]  BLADDER SCAN AMB NON-IMAGING     Status: None   Collection Time: 09/13/21 11:06 AM  Result Value Ref Range   Scan Result 22m   PSA     Status: None   Collection Time: 09/13/21 11:50 AM  Result Value Ref Range   Prostatic Specific Antigen 0.42 0.00 - 4.00 ng/mL    Comment: (NOTE) While PSA levels of <=4.0 ng/ml are reported as reference range, some men with levels below 4.0 ng/ml can have prostate cancer and many men with PSA above 4.0 ng/ml do not have prostate cancer.  Other tests such as free PSA, age specific reference ranges, PSA velocity and PSA doubling time may be helpful especially in men less than 630years old. Performed at [facility] [id number] [place] [id number]    Pt had AKI, GFR did not fully recover to baseline, but had improved some  on 9/20, compared to 9/19 H/H decreased a little likely hemedilution from IVF in hospital   BNP was negative- ECHO done on 9/20 LVEF 55-60% no RWMA, left ventricular diastopic parameter consistent with grade II DD ECG done on 9/21 and 9/1 (both ER visits) reviewed today CTA 9/1 normal: IMPRESSION: 1. No evidence of aortic dissection or aneurysm. 2. Coronary artery calcifications. 3. Small hiatal hernia. 4. Stable mildly complex right hepatic cyst. CT neck soft tissue: FINDINGS: Pharynx and larynx: No evidence of a mass or significant swelling within limitations of dental streak artifact and mild motion. No fluid collection or significant inflammatory changes in the parapharyngeal or retropharyngeal spaces. Patent airway.   Salivary glands: Chronic fatty atrophy of the submandibular glands as well as heterogeneous fatty infiltration and nodularity of the parotid glands with  numerous punctate parotid calcifications suggesting a chronic inflammatory or autoimmune process such as Sjogren's.   Thyroid: Unremarkable.   Lymph nodes: No enlarged or suspicious lymph nodes in the neck.   Vascular: Major vascular structures of the neck are grossly patent. Mild carotid atherosclerosis.   Limited intracranial: Unremarkable.   Visualized orbits: Unremarkable.   Mastoids and visualized paranasal sinuses: Small right maxillary sinus. Minimal right posterior ethmoid air cell mucosal thickening. Clear mastoid air cells.   Skeleton: Mild lower cervical and upper thoracic spondylosis. Hemangioma in the T3 vertebral body.   Upper chest: Clear lung apices.   Other: None.   IMPRESSION: No acute abnormality identified in the neck.     Electronically Signed   By: Logan Bores M.D.   On: 08/22/2021 15:20 CXR 9/19- no acute abd, chronic bronchitis changes NM pulm perfusion test - neg

## 2021-09-18 NOTE — Telephone Encounter (Signed)
Patient also left a message on the triage line stating his symptoms are the same and there is no change since starting the medications last Friday. Returned patient's call and explained to him that Tamsulosin and Oxybutinin take 2-4 weeks to see change in symptoms. Patient should call back if he develops worsening symptoms such as dysuria, hematuria, fever or chills. If no change in symptoms or if symptoms worsen after at least 2 weeks he should call back to re-evaluate and consider medication therapy change. Patient verbalized understanding.

## 2021-09-18 NOTE — Progress Notes (Signed)
Name: KIEN MIRSKY   MRN: [id number]    DOB: 1961/05/13   Date:09/18/2021       Progress Note  Chief Complaint  Patient presents with   Shortness of Breath    Continued SOB has went to ER and pulmonology     Subjective:   MARRION ACCOMANDO is a 60 y.o. male, presents to clinic for hospital f/up and continued SOB Reviewed ER visits in Sept and brief hospitalization:  Admit date: 09/10/2021 Discharge date: 09/11/2021   Admitted From: Home Disposition: Home   Recommendations for Outpatient Follow-up:  Follow up with PCP in 1-2 weeks Follow-up with pulmonology as directed   Home Health: Yes, home health PT Equipment/Devices: No   Discharge Condition: Stable CODE STATUS: Full Diet recommendation: Regular   Brief/Interim Summary: 60 y.o. male with medical history significant for COPD, SLE on CellCept and prednisone, history of DVT and PE on Eliquis, CKD 3a who presents to the ED with a 3-day history of nausea and diarrhea.  He denies abdominal pain and denies fever but has been having chills.  Patient also has a complaint of shortness of breath with occasional cough for which she was seen in the ED on 9/1 and had an unremarkable work-up which included a negative CTA chest.  He was diagnosed with acute viral bronchitis at the time.  COVID was negative.  Due to persistent symptoms and new diarrhea returns to the ED for evaluation.   Etiology of complaints is unclear.  Diarrhea nausea did resolve.  Patient tolerating p.o. intake at time of discharge.  Discussed with pulmonology.  Cleared for discharge from their standpoint.  We will follow-up on infectious panel as outpatient.  No changes made in home medication regimen.  Small amount of as needed Zofran provided on discharge.  Echocardiogram demonstrated normal ejection fraction with pseudo normal diastolic dysfunction.     Discharge Diagnoses:  Principal Problem:   Acute kidney injury superimposed on CKD llla ([place]) Active  Problems:   History of pulmonary embolism   COPD, moderate (HCC)   Systemic lupus erythematosus (HCC)   Long term current use of anticoagulant   Benign essential hypertension   Long term current use of systemic steroids   Hypertension   Acute gastroenteritis   Acute kidney injury superimposed on CKD llla ([place])   Acute gastroenteritis - Patient with 3-day history of nausea and nonbloody diarrhea, with creatinine 2.41 up from baseline of 1.3 Suspect AKI secondary to poor p.o. intake in setting of acute gastroenteritis.  Symptoms of gastroenteritis resolved during hospital admission.  Creatinine improving but not at baseline at time of discharge.  Recommend adequate p.o. fluid intake and can restart home losartan.  Shortness of breath x3 weeks  History of pulmonary embolism on Eliquis - Results  Pt had AKI, GFR did not fully recover to baseline, but had improved some on 9/20, compared to 9/19 H/H decreased a little likely hemedilution from IVF in hospital (sees hematology)   BNP was negative- ECHO done on 9/20 LVEF 55-60% no RWMA, left ventricular diastopic parameter consistent with grade II DD ECG done on 9/21 and 9/1 (both ER visits) reviewed today CTA 9/1 normal: IMPRESSION: 1. No evidence of aortic dissection or aneurysm. 2. Coronary artery calcifications. 3. Small hiatal hernia. 4. Stable mildly complex right hepatic cyst.  CT neck soft tissue: FINDINGS: Pharynx and larynx: No evidence of a mass or significant swelling within limitations of dental streak artifact and mild motion. No fluid collection or significant  inflammatory changes in the parapharyngeal or retropharyngeal spaces. Patent airway.   Salivary glands: Chronic fatty atrophy of the submandibular glands as well as heterogeneous fatty infiltration and nodularity of the parotid glands with numerous punctate parotid calcifications suggesting a chronic inflammatory or autoimmune process such as Sjogren's.    Thyroid: Unremarkable.   Lymph nodes: No enlarged or suspicious lymph nodes in the neck.   Vascular: Major vascular structures of the neck are grossly patent. Mild carotid atherosclerosis.   Limited intracranial: Unremarkable.   Visualized orbits: Unremarkable.   Mastoids and visualized paranasal sinuses: Small right maxillary sinus. Minimal right posterior ethmoid air cell mucosal thickening. Clear mastoid air cells.   Skeleton: Mild lower cervical and upper thoracic spondylosis. Hemangioma in the T3 vertebral body.   Upper chest: Clear lung apices.   Other: None.   IMPRESSION: No acute abnormality identified in the neck.     Electronically Signed   By: Logan Bores M.D.   On: 08/22/2021 15:20 CXR 9/19- no acute abd, chronic bronchitis changes NM pulm perfusion test - neg   Reviewed Jefm Bryant and pulm records - pt did not note that 2 weeks ago he saw Dr. Lanney Gins and was tx for presumed pneumonia witch concern for PJP - given bactrim, continue cellcept, prednisone 10 mg daily, stop singulair - given dupixent injection but did not start yet - plan for BAL, prior PFT in 06/2020 showed moderately severe restriction - copy of last note from Pulm follows: Chief Complaint: Eosinophilic asthma, pulmonary embolism, SLE  Interval History :  Mr. Ryaan Vanwagoner is 60 y.o. male who was referred to [facility] due to dsypnea. He has hx of SLE. He has never smoked. He shares that as kid he had breathing issues. He reports that he would get red in the face and had to have father to percussion therapy on him to improve his breathing. He has used nebulizer for COPD in the past. He is on cellcept 1g bid and prednisone 52m, he has been on this since 2014. His dyspnea has been going on for years. He had PE and DVT in the past. He has family hx of asthma and has eosinophilic asthma.   07/05/20---PFT - moderately severe restriction - query CTED from previous PE vs ILD related to SLE with  concomitant reactive airway disease.   03/27/21- patient returns for evaluation of Dyspnea, he complains of allergies at this time. He is on Cellcept and prednisone. He was supposed to start on dupixent and he was unable to get it done. He has surgery planned for back after having injury.   09/04/21- patient shares he was unable to get Dupixent started due to overpriced Epi pen >$350. This is cost prohibitive. He reports some feelings of throat discomfort and had CT neck and CTPE done. We will stop Singulair. He continues to takes Cellcept 10043mBID and prednisone 1038maily and this is definitely predisposing him to opportunistic infections. Im concerned regarding possibility of PJP sometimes this is indolent and does not show up on imaging in early stages. He is high risk. Will empirically start therapy today and plan for BAL with bronchosocpy for invasive specimen.  MedicalDecision Making:   Impression/Plan:   Moderate persistent asthma-eosinophilic endotype -patient has been on chronic prednisone and nebulizer therapy without improvement.  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   Continues to be SOB x4 weeks-he states that it persistently feels difficult to breathe worse with exertion, worse with laying down, associated with rapid heart rate palpitations and "feeling like he is drowning" he has 2 pillow orthopnea and symptoms are much worse without the pillows, he has had episodes where he is woken from sleep with rapid heart rate and pounding and shortness of breath he started monitoring his pulse ox at home as noted that his heart rate has  recently been on average in the 140s to 150s and with walking his pulse ox with his recent hospitalization and at home dipped into the 70s and 80s. He has follow-up with pulmonology tomorrow In the ER he did have cardiac work-up with all EKG showing normal sinus rhythm, no tachycardia today with initial vital signs, he does appear dyspneic at rest, normal pulse ox, reviewed his echo which showed grade 2 diastolic dysfunction with a normal left ventricular ejection fraction and no regional wall motion abnormalities, he had negative BNP in the hospital on 9/20 he states he went to cardiology in the past as just a consult 1 time a from rheumatology but he has not had any continuous cardiology follow-up he is anxious about his cardiac health his father died from major cardiac problems when he was 36.   Reviewed his labs with him from the hospital he had serial troponins which were negative, VQ scan negative, CTA negative, chest x-ray negative  Tremor and shakes for the last month -he states that it occurs all the time it is in both of his hands but sometimes his whole body is shaking He was given hydroxyzine in the past when told his symptoms were possibly anxiety that made him sleepy but did not help his anxiety and is not currently taking this He just got oxybutynin from urology last week this did not change his tremor at all, symptoms with the same before and after He denies using any other antihistamines not on allergy pills or Benadryl He is taking Singulair -pulmonology note is a little confusing it states to discontinue Singulair but in assessment and plan says continue Singulair for allergic rhinitis Patient states has been on prednisone and CellCept for 6 years never had any symptoms like he is currently having with his old meds He is using albuterol rescue inhaler and duo nebs using several times a day he does not know any worse shake or tremor immediately after treatments He endorses feeling  generally weak often getting fatigued, notes its harder to think and remember things he denies ever being acutely confused or having altered mental status syncopal episode any slurred speech, facial droop or any acute changes to his mobility strength or sensation.  His bilateral lower extremities have been weak since his low back surgery in July He has also had urinary frequency since his back surgery He denies any vertigo, difficulties with balance or gait or feeling off balance denies visual changes he occasionally has headaches He has been on gabapentin 300 mg 3 times daily since his back surgery and he is trying to wean off this medication  Dysphagia-ER visits from the last month patient complained of difficulty swallowing he states that he has no trouble swallowing liquids but trouble swallowing solids it feels like it gets stuck in his throat he has not had any obstruction regurgitation or vomiting  he does not feel anything stuck lower in his chest he is eating less, chewing food more thoroughly and choosing softer foods and often has to drink something in order to get his solid foods to pass -he has not seen GI or ENT for this he was also told his dysphagia may be a symptom of anxiety  HTN -continues to take Norvasc and losartan for hypertension his blood pressure is well controlled today HLD-he has been on a statin for over the past year but has not done labs follow-up here      Current Outpatient Medications:    albuterol (VENTOLIN HFA) 108 (90 Base) MCG/ACT inhaler, Inhale 2 puffs into the lungs every 6 (six) hours as needed for wheezing or shortness of breath., Disp: 18 g, Rfl: 2   amLODipine (NORVASC) 5 MG tablet, Take 1 tablet (5 mg total) by mouth daily., Disp: 90 tablet, Rfl: 3   apixaban (ELIQUIS) 5 MG TABS tablet, Take 1 tablet (5 mg total) by mouth 2 (two) times daily., Disp: 60 tablet, Rfl: 2   cholecalciferol (VITAMIN D3) 25 MCG (1000 UNIT) tablet, Take 1,000 Units by mouth  daily., Disp: , Rfl:    gabapentin (NEURONTIN) 300 MG capsule, Take 300 mg by mouth 3 (three) times daily., Disp: , Rfl:    HYDROcodone-acetaminophen (NORCO) 10-325 MG tablet, , Disp: , Rfl:    hydrOXYzine (ATARAX/VISTARIL) 10 MG tablet, Take 1-2 tablets (10-20 mg total) by mouth 3 (three) times daily as needed., Disp: 60 tablet, Rfl: 0   ipratropium-albuterol (DUONEB) 0.5-2.5 (3) MG/3ML SOLN, Take 3 mLs by nebulization every 4 (four) hours as needed., Disp: 360 mL, Rfl: 3   losartan (COZAAR) 100 MG tablet, Take 1 tablet (100 mg total) by mouth daily., Disp: 90 tablet, Rfl: 3   Mepolizumab (NUCALA) 100 MG/ML SOAJ, Inject into the skin., Disp: , Rfl:    montelukast (SINGULAIR) 10 MG tablet, Take 1 tablet (10 mg total) by mouth at bedtime., Disp: 90 tablet, Rfl: 3   Multiple Vitamins-Minerals (MULTIVITAMIN WITH MINERALS) tablet, Take 1 tablet by mouth daily., Disp: , Rfl:    mupirocin ointment (BACTROBAN) 2 %, Apply 1 application topically daily. Until healed at bottom of right foot, Disp: 22 g, Rfl: 0   mycophenolate (CELLCEPT) 500 MG tablet, Take 1,000 mg by mouth 2 (two) times daily., Disp: , Rfl:    nystatin-triamcinolone ointment (MYCOLOG), Apply 1 application topically 2 (two) times daily., Disp: 30 g, Rfl: 0   oxybutynin (DITROPAN-XL) 10 MG 24 hr tablet, Take 1 tablet (10 mg total) by mouth daily., Disp: 30 tablet, Rfl: 11   predniSONE (DELTASONE) 10 MG tablet, Take 10 mg by mouth daily with breakfast., Disp: , Rfl:    rosuvastatin (CRESTOR) 20 MG tablet, Take one pill every other night before bed., Disp: 45 tablet, Rfl: 0   tamsulosin (FLOMAX) 0.4 MG CAPS capsule, Take 1 capsule (0.4 mg total) by mouth daily., Disp: 30 capsule, Rfl: 11   loratadine (CLARITIN) 10 MG tablet, Take 1 tablet (10 mg total) by mouth daily. (Patient not taking: Reported on 09/18/2021), Disp: 90 tablet, Rfl: 3  Patient Active Problem List   Diagnosis Date Noted   Acute gastroenteritis 09/10/2021   Hypertension  06/14/2021   Morbid obesity ([place])    COVID-19 virus infection 12/2020   Prediabetes 11/08/2020   Stage 3a chronic kidney disease ([place]) 11/08/2020   Seasonal allergies 11/08/2020   Rhinosinusitis 11/08/2020   Long term current use of systemic steroids 11/08/2020   Anticoagulant  disorder ([place]) 11/07/2020   Senile purpura ([place]) 11/07/2020   History of DVT (deep vein thrombosis) 03/22/2020   Herpes zoster infection of lumbar region 02/20/2020   Severe episode of recurrent major depressive disorder, without psychotic features ([place]) 09/12/2019   Other spondylosis with radiculopathy, lumbar region 02/16/2019   Osteoporosis 10/12/2018   Chronic venous insufficiency 10/07/2018   SLE glomerulonephritis syndrome, 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[place]) 03/03/2018   Acute kidney injury superimposed on CKD llla ([place])    Syncope 12/29/2017   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 ([place]) 10/29/2016   Mixed hyperlipidemia 01/15/2016   Benign essential hypertension 01/15/2016   Obesity (BMI 30.0-34.9) 01/15/2016   Long term current use of anticoagulant 10/04/2015   Systemic lupus erythematosus ([place]) 05/30/2015   Pulmonary embolism ([place]) 05/30/2015   COPD, moderate ([place]) 06/27/2014   History of pulmonary embolism 12/11/2013   Nodule of right lung 12/11/2013   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   Cystic disease of liver 08/21/2013    Past Surgical History:  Procedure Laterality Date   ANKLE SURGERY Right    COLONOSCOPY WITH PROPOFOL N/A 05/28/2020   Procedure: COLONOSCOPY WITH PROPOFOL;  Surgeon: Jonathon Bellows, MD;  Location: [facility] ENDOSCOPY;  Service: Endoscopy;  Laterality: N/A;   CYST EXCISION  92 or 93    Liver cyst removal [facility]   I & D EXTREMITY Right 04/29/2017   Procedure: IRRIGATION AND DEBRIDEMENT EXTREMITY;  Surgeon: Clayburn Pert, MD;  Location: [facility] ORS;  Service: General;  Laterality: Right;   IRRIGATION AND DEBRIDEMENT ABSCESS Left 04/29/2017    Procedure: IRRIGATION AND DEBRIDEMENT Scrotal ABSCESS;  Surgeon: Clayburn Pert, MD;  Location: [facility] ORS;  Service: General;  Laterality: Left;    Family History  Problem Relation Age of Onset   Hypertension Father    Heart disease Father    Clotting disorder Mother    Kidney disease Brother    Heart attack Maternal Grandmother    Heart attack Maternal Grandfather    Heart attack Paternal Grandfather     Social History   Tobacco Use   Smoking status: Never   Smokeless tobacco: Never  Vaping Use   Vaping Use: Never used  Substance Use Topics   Alcohol use: No   Drug use: No     Allergies  Allergen Reactions   Enalapril Other (See Comments)   Vicodin [Hydrocodone-Acetaminophen] Hives and Rash    Severe headaches (also)    Health Maintenance  Topic Date Due   INFLUENZA VACCINE  07/22/2021   Zoster Vaccines- Shingrix (1 of 2) 09/20/2021 (Originally [birth year]/11/1980)   COVID-19 Vaccine (5 - Booster for [place] series) 12/13/2021   COLONOSCOPY (Pts 45-21yr Insurance coverage will need to be confirmed)  05/29/2023   TETANUS/TDAP  08/09/2031   Hepatitis C Screening  Completed   HIV Screening  Completed   HPV VACCINES  Aged Out    Chart Review Today: I personally reviewed active problem list, medication list, allergies, family history, social history, health maintenance, notes from last encounter, lab results, imaging with the patient/caregiver today. Extensive personal review of multiple ER visits, admission record, labs, ECHO, ECGs, VQ scans, CT's, specialists consults visits and notes - more than 20+ min today   Review of Systems  Constitutional:  Positive for activity change, appetite change and fatigue. Negative for chills, diaphoresis, fever and unexpected weight change.  HENT: Negative.    Eyes: Negative.   Respiratory:  Positive for shortness of breath.   Cardiovascular:  Positive for palpitations. Negative for chest pain and leg swelling.  Gastrointestinal:  Negative.   Endocrine: Negative.   Genitourinary:  Positive for difficulty urinating and frequency.  Musculoskeletal:  Positive for back pain and gait problem.  Skin: Negative.   Allergic/Immunologic: Negative.   Neurological:  Positive for tremors, weakness and headaches. Negative for dizziness, seizures, syncope, facial asymmetry, speech difficulty, light-headedness and numbness.  Hematological:  Negative for adenopathy. Bruises/bleeds easily.  Psychiatric/Behavioral:  Positive for confusion, decreased concentration, dysphoric mood and sleep disturbance. The patient is nervous/anxious.     Objective:   Vitals:   09/18/21 1343  BP: 116/78  Pulse: 84  Resp: 20  Temp: 98.3 F (36.8 C)  SpO2: 98%  Weight: 237 lb 6.4 oz (107.7 kg)  Height: _0  (1.905 m)    Body mass index is 29.67 kg/m.  Physical Exam Vitals and nursing note reviewed.  Constitutional:      General: He is not in acute distress.    Appearance: He is well-developed. He is obese. He is ill-appearing. He is not toxic-appearing or diaphoretic.  HENT:     Head: Normocephalic and atraumatic.     Right Ear: External ear normal.     Left Ear: External ear normal.  Eyes:     General:        Right eye: No discharge.        Left eye: No discharge.     Conjunctiva/sclera: Conjunctivae normal.  Cardiovascular:     Rate and Rhythm: Normal rate and regular rhythm.     Pulses: Normal pulses.     Heart sounds: Normal heart sounds. No murmur heard.   No friction rub. No gallop.  Pulmonary:     Breath sounds: Examination of the right-lower field reveals decreased breath sounds. Examination of the left-lower field reveals decreased breath sounds. Decreased breath sounds present. No wheezing, rhonchi or rales.     Comments: Appears SOB w/o tachypnea, retractions or accessory muscle use, appeared very difficult to give good inspiratory effort Able to speak in complete sentences  Abdominal:     General: Bowel sounds are  normal. There is no distension.     Palpations: Abdomen is soft.  Musculoskeletal:     Right lower leg: Tenderness present. Edema present.     Left lower leg: Tenderness present. Edema present.     Comments: BL LE non-pitting edema, mild generalized ttp to LE, normal sensation to light touch   Skin:    General: Skin is warm and dry.     Capillary Refill: Capillary refill takes less than 2 seconds.     Coloration: Skin is pale. Skin is not cyanotic or jaundiced.     Findings: No rash.  Neurological:     Mental Status: He is alert.     Cranial Nerves: No dysarthria or facial asymmetry.     Sensory: Sensation is intact.     Motor: Weakness and tremor present. No seizure activity.     Coordination: Coordination abnormal.     Gait: Gait abnormal.     Comments: LANG/SPEECH: fluent, no dysarthria, follows 3-step commands, answers questions appropriately MOTOR:  4/5 bilateral grip strength 5/5 strength dorsiflexion/plantarflexion b/l SENSORY:  Normal to light touch  Able to grab pen and write name and tremor to right arm improved during task  When encouraged to take deep breaths for lung exam, significant increase in BUE tremor      Ambulatory pulse ox - very slow ambulation around clinic:  09/18/21 1458  Resting  Resting Heart Rate 89  Resting Sp02 97  Lap 1 (250 feet)  HR 88  02 Sat 96  Lap 2 (250 feet)  HR 100  02 Sat 94  Lap 3 (250 feet)  HR 97  02 Sat 93     Assessment & Plan:   1. SOB (shortness of breath) Pt following up with pulmonology tomorrow -reviewed recent pulmonology records patient did not report recent office visit, being on antibiotics and future follow-up with possible BAL and concern for PJP -prior pulmonary function testing - PFT - moderately severe restriction - query CTED from previous PE vs ILD related to SLE with concomitant reactive airway disease. -Patient's lungs were clear to auscultation he did appear to have difficulty with deep inspiration  and this caused worse tremor to his bilateral upper extremities he was able to ambulate around the clinic 3 times without any hypoxia Keep follow-up tomorrow with pulmonology possibly inquire about Xopenex in place of albuterol -unclear if it is related to his tremor? - Ambulatory referral to Cardiology - CBC with Differential/Platelet - Ambulatory referral to [place]  2. Encounter for examination following treatment at hospital Extensive chart review taking more than 15 minutes today including ER visit on 08/22/21 and hospitalization on 09/10/21 Recheck H/H -he was concerned about his hemoglobin levels but I suspect this is from some heme dilution he is also seeing a hematologist and could follow-up there if any new anemia, he is on long-term anticoagulants, seeing urology, no reported melena or hematochezia no spontaneous bleeding or bruising Recheck labs for AKI secondary to gastroenteritis which may have been related to his antibiotic use?  - CBC with Differential/Platelet - COMPLETE METABOLIC PANEL WITH GFR - TSH - Magnesium - Ambulatory referral to [place]  3. Chronic obstructive pulmonary disease, unspecified COPD type ([place]) Follow-up with pulmonology  4. Long term current use of anticoagulant - Ambulatory referral to Cardiology  5. Systemic lupus erythematosus, unspecified SLE type, unspecified organ involvement status ([place]) Dr. Jefm Bryant is managing her rheumatologist -patient appears significantly different since I saw him last about 9 months ago concern for progression of disease? - Ambulatory referral to Cardiology - Ambulatory referral to Neurology  6. AKI (acute kidney injury) ([place]) Recheck labs - f/up with nephro if not approaching baseline CKD stage 3a - COMPLETE METABOLIC PANEL WITH GFR  7. Tachycardia Patient endorses persistent tachycardia when monitoring at home however hospital encounters in our office visit here have normal heart rate and EKG shows normal  sinus this may be more palpitations he is experiencing he states his heart rate has been increasing gradually over the last month with associated shortness of breath orthopnea PND and feeling like he is drowning it occurs with exertion sometimes at rest and sometimes waking him from sleep refer to cardiology for further work-up may need Holter monitor? - Ambulatory referral to Cardiology - CBC with Differential/Platelet - COMPLETE METABOLIC PANEL WITH GFR - TSH - Magnesium  8. Palpitations See #7 - Ambulatory referral to Cardiology - CBC with Differential/Platelet - COMPLETE METABOLIC PANEL WITH GFR - TSH - Magnesium  9. Oropharyngeal dysphagia Difficulty swallowing solid foods feels that he gets stuck in his throat and neck for the past month Possible barium swallow? Refer to ENT Reviewed records of CT soft tissue neck - Salivary glands: Chronic fatty atrophy of the submandibular glands as well as heterogeneous fatty infiltration and nodularity of the parotid glands with numerous punctate parotid calcifications suggesting a chronic  inflammatory or autoimmune process such as Sjogren's. - Ambulatory referral to [place]?   10. Echocardiography shows left ventricular diastolic dysfunction Reviewed echo and recent EKGs with the patient in the room today He has not had any lower extremity edema, BNP was negative but he does have 2 pillow orthopnea, PND, exertional dyspnea, palpitations and tachycardia -with combined diastolic dysfunction and tachycardia episodes his shortness of breath may be cardiac in nature?   - Ambulatory referral to Cardiology  11. Orthopnea See above - Ambulatory referral to Cardiology  12. Continuous tremor I have reviewed his meds with him thoroughly he does not seem to be taking any excessive amounts of anticholinergics he has stopped hydroxyzine, not on over-the-counter allergy medicines or Benadryl, he recently started oxybutynin and that has not  changed his tremor will check thyroid and CMP and mag, tremor seems to be worse with any increased physical efforts such as deep inspiration when doing his lung exam or with ambulating his tremor seem to get better when he wrote his name for me (do not suspect intention tremor)  With multiple acute and chronic health conditions and not seeing the patient for the last year I am unsure if this is a new central nervous system problem, systemic etiology?  Related to med side effects?  I asked him to discuss with pulmonology using Xopenex instead of albuterol although he denies any worsening tremor after doing breathing treatments or rescue inhaler  - CBC with Differential/Platelet - COMPLETE METABOLIC PANEL WITH GFR - TSH - Magnesium - Ambulatory referral to Neurology  13. Decreased grip strength 4/5 grip strength bilaterally to upper extremities, grossly normal sensation to light touch He recently had low back surgery and had neurogenic claudication and new urinary frequency and nerve pain and mobility issues with his lower extremity after surgery in July today on exam he had grossly normal sensation to light touch in bilateral lower extremities and 5 out of 5 with dorsiflexion and plantarflexion - Ambulatory referral to Neurology - Ambulatory referral to [place]  14. Anemia, unspecified type Recheck labs from hospital, follow-up with hematology - CBC with Differential/Platelet  15. Weakness Generalized overall feeling weak easily tired, difficulty with cognition memory concentration when he feels worse especially with shortness of breath palpitations and generalized weakness all new and progressive over the past month - CBC with Differential/Platelet - COMPLETE METABOLIC PANEL WITH GFR - Magnesium - Ambulatory referral to Neurology - Ambulatory referral to [place]  16. Hyperlipidemia, unspecified hyperlipidemia type On statins due for recheck of labs will need to reschedule a  routine office visit follow-up - Lipid panel  17. Prediabetes Same as #16 - Hemoglobin A1C   Need for PCP to do [place] orders - reviewed inpt PT eval assessment and plan: Pt received in Semi-Fowler's position and agreeable to therapy.  Pt notes his breathing has been the most difficult portion of his rehab process and continues to be difficult for him.  Pt able to perform transfers and ambulation with FWW well, progressing to no AD and ambulating down the hall.  Pt does have some instability with ambulation with AD, mostly due to breathing difficulty.  Pt and therapist discussed HHPT option in order for pt to get strengthening/exercise regimen in order to return to PLOF and pt agreeable.  Pt will benefit from skilled PT intervention to increase independence and safety with basic mobility in preparation for discharge to the venue listed below.    Assessment/Plan  PT Assessment Patient needs continued PT services  PT Problem List Decreased strength;Decreased activity tolerance;Decreased balance;Decreased mobility          PT Treatment Interventions DME instruction;Gait training;Functional mobility training;Therapeutic activities;Therapeutic exercise;Balance training;Neuromuscular re-education;Patient/family education     PT Goals (Current goals can be found in the Care Plan section)   Acute Rehab PT Goals Patient Stated Goal: To go back home PT Goal Formulation: With patient Time For Goal Achievement: 09/25/21 Potential to Achieve Goals: Good      Frequency Min 2X/week         18. Immunocompromised state due to drug therapy [facility]) - Ambulatory referral to [place]. Major depressive disorder in partial remission, unspecified whether recurrent (HCC) Phq score positive, reviewed, pt not feeling well, wants to get his new sx figured out and get back to work.   We ordered several referrals today, close f/up will f/up closely on his depressive sx - significant change in his  appearance and functional status and health  20. Senile purpura ([place]) Reassured, stable  21. Decreased activity tolerance Much worse - Ambulatory referral to [place]  22. Decreased mobility S/p back surgery and worsening with weakness and DOE - Ambulatory referral to [place]  23. Balance problems Unsteady, using walker - Ambulatory referral to [place]  24. Decreased strength - Ambulatory referral to [place]    Return in 2 weeks (on 10/02/2021) for Routine follow-up get in for routine OV with any provider ASAP in the next couple months.   Delsa Grana, PA-C 09/18/21 2:00 PM

## 2021-09-19 ENCOUNTER — Telehealth: Payer: Self-pay

## 2021-09-19 ENCOUNTER — Ambulatory Visit (INDEPENDENT_AMBULATORY_CARE_PROVIDER_SITE_OTHER): Payer: 59

## 2021-09-19 DIAGNOSIS — Z23 Encounter for immunization: Secondary | ICD-10-CM

## 2021-09-19 LAB — COMPLETE METABOLIC PANEL WITH GFR
AG Ratio: 1.2 (calc) (ref 1.0–2.5)
ALT: 15 U/L (ref 9–46)
AST: 17 U/L (ref 10–35)
Albumin: 3.9 g/dL (ref 3.6–5.1)
Alkaline phosphatase (APISO): 94 U/L (ref 35–144)
BUN/Creatinine Ratio: 15 (calc) (ref 6–22)
BUN: 24 mg/dL (ref 7–25)
CO2: 23 mmol/L (ref 20–32)
Calcium: 9.7 mg/dL (ref 8.6–10.3)
Chloride: 109 mmol/L (ref 98–110)
Creat: 1.64 mg/dL — ABNORMAL HIGH (ref 0.70–1.30)
Globulin: 3.3 g/dL (calc) (ref 1.9–3.7)
Glucose, Bld: 102 mg/dL — ABNORMAL HIGH (ref 65–99)
Potassium: 5.1 mmol/L (ref 3.5–5.3)
Sodium: 143 mmol/L (ref 135–146)
Total Bilirubin: 0.6 mg/dL (ref 0.2–1.2)
Total Protein: 7.2 g/dL (ref 6.1–8.1)
eGFR: 48 mL/min/{1.73_m2} — ABNORMAL LOW (ref >=60)

## 2021-09-19 LAB — LIPID PANEL
Cholesterol: 139 mg/dL (ref <200)
HDL: 45 mg/dL (ref >=40)
LDL Cholesterol (Calc): 76 mg/dL (calc)
Non-HDL Cholesterol (Calc): 94 mg/dL (calc) (ref <130)
Total CHOL/HDL Ratio: 3.1 (calc) (ref <5.0)
Triglycerides: 92 mg/dL (ref <150)

## 2021-09-19 LAB — CBC WITH DIFFERENTIAL/PLATELET
Absolute Monocytes: 594 cells/uL (ref 200–950)
Basophils Absolute: 12 cells/uL (ref 0–200)
Basophils Relative: 0.2 %
Eosinophils Absolute: 0 cells/uL — ABNORMAL LOW (ref 15–500)
Eosinophils Relative: 0 %
HCT: 41.5 % (ref 38.5–50.0)
Hemoglobin: 13.2 g/dL (ref 13.2–17.1)
Lymphs Abs: 858 cells/uL (ref 850–3900)
MCH: 28 pg (ref 27.0–33.0)
MCHC: 31.8 g/dL — ABNORMAL LOW (ref 32.0–36.0)
MCV: 88.1 fL (ref 80.0–100.0)
MPV: 10.8 fL (ref 7.5–12.5)
Monocytes Relative: 9.9 %
Neutro Abs: 4536 cells/uL (ref 1500–7800)
Neutrophils Relative %: 75.6 %
Platelets: 263 10*3/uL (ref 140–400)
RBC: 4.71 10*6/uL (ref 4.20–5.80)
RDW: 14.8 % (ref 11.0–15.0)
Total Lymphocyte: 14.3 %
WBC: 6 10*3/uL (ref 3.8–10.8)

## 2021-09-19 LAB — HEMOGLOBIN A1C
Hgb A1c MFr Bld: 5.7 % of total Hgb — ABNORMAL HIGH (ref <5.7)
Mean Plasma Glucose: 117 mg/dL
eAG (mmol/L): 6.5 mmol/L

## 2021-09-19 LAB — TSH: TSH: 0.81 mIU/L (ref 0.40–4.50)

## 2021-09-19 LAB — MAGNESIUM: Magnesium: 2 mg/dL (ref 1.5–2.5)

## 2021-09-19 NOTE — Telephone Encounter (Signed)
Advised pt referral to [place] was placed yesterday.  Advised pt if he did not hear from them in a week to call the office so we could check on the referral status./sh

## 2021-09-20 ENCOUNTER — Ambulatory Visit: Payer: 59 | Admitting: Cardiovascular Disease

## 2021-09-20 ENCOUNTER — Other Ambulatory Visit: Payer: Self-pay

## 2021-09-20 ENCOUNTER — Encounter: Payer: Self-pay | Admitting: Cardiovascular Disease

## 2021-09-20 VITALS — BP 160/70 | HR 89 | Ht 75.0 in | Wt 241.2 lb

## 2021-09-20 DIAGNOSIS — I25118 Atherosclerotic heart disease of native coronary artery with other forms of angina pectoris: Secondary | ICD-10-CM | POA: Diagnosis not present

## 2021-09-20 DIAGNOSIS — N1832 Chronic kidney disease, stage 3b: Secondary | ICD-10-CM

## 2021-09-20 DIAGNOSIS — I825Y2 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: Secondary | ICD-10-CM

## 2021-09-20 DIAGNOSIS — R0602 Shortness of breath: Secondary | ICD-10-CM

## 2021-09-20 DIAGNOSIS — M3214 Glomerular disease in systemic lupus erythematosus: Secondary | ICD-10-CM

## 2021-09-20 DIAGNOSIS — E782 Mixed hyperlipidemia: Secondary | ICD-10-CM | POA: Diagnosis not present

## 2021-09-20 NOTE — Patient Instructions (Signed)
Lexiscan scan myoview for angina/SOB, CAD on CT scan  Medication Instructions:  No changes  If you need a refill on your cardiac medications before your next appointment, please call your pharmacy.    Lab work: No new labs needed   If you have labs (blood work) drawn today and your tests are completely normal, you will receive your results only by: [place] (if you have MyChart) OR A paper copy in the mail If you have any lab test that is abnormal or we need to change your treatment, we will call you to review the results.   Testing/Procedures: [place]  Your caregiver has ordered a Stress Test with nuclear imaging. The purpose of this test is to evaluate the blood supply to your heart muscle. This procedure is referred to as a "Non-Invasive Stress Test." This is because other than having an IV started in your vein, nothing is inserted or "invades" your body. Cardiac stress tests are done to find areas of poor blood flow to the heart by determining the extent of coronary artery disease (CAD). Some patients exercise on a treadmill, which naturally increases the blood flow to your heart, while others who are  unable to walk on a treadmill due to physical limitations have a pharmacologic/chemical stress agent called Lexiscan . This medicine will mimic walking on a treadmill by temporarily increasing your coronary blood flow.   Please note: these test may take anywhere between 2-4 hours to complete  PLEASE REPORT TO [place] AT THE FIRST DESK WILL DIRECT YOU WHERE TO GO  Date of Procedure:_____________________________________  Arrival Time for Procedure:______________________________  Instructions regarding medication: Take all your medications the am or procedure.   PLEASE NOTIFY THE OFFICE AT LEAST 41 HOURS IN ADVANCE IF YOU ARE UNABLE TO KEEP YOUR APPOINTMENT.  [phone] AND  PLEASE NOTIFY NUCLEAR MEDICINE AT [facility] AT LEAST 24 HOURS IN  ADVANCE IF YOU ARE UNABLE TO KEEP YOUR APPOINTMENT. [phone]  How to prepare for your Myoview test:  Do not eat or drink after midnight No caffeine for 24 hours prior to test No smoking 24 hours prior to test. Your medication may be taken with water.  If your doctor stopped a medication because of this test, do not take that medication. Ladies, please do not wear dresses.  Skirts or pants are appropriate. Please wear a short sleeve shirt. No perfume, cologne or lotion. Wear comfortable walking shoes. No heels!  Follow-Up: At [facility], you and your health needs are our priority.  As part of our continuing mission to provide you with exceptional heart care, we have created designated Provider Care Teams.  These Care Teams include your primary Cardiologist (physician) and Advanced Practice Providers (APPs -  Physician Assistants and Nurse Practitioners) who all work together to provide you with the care you need, when you need it.  You will need a follow up appointment as needed  Providers on your designated Care Team:   Murray Hodgkins, NP Christell Faith, PA-C Marrianne Mood, PA-C Cadence Kathlen Mody, [place]  Any Other Special Instructions Will Be Listed Below (If Applicable).  COVID-19 Vaccine Information can be found at: [email or web address].uk For questions related to vaccine distribution or appointments, please email [email or web address] or call [phone].

## 2021-09-20 NOTE — Progress Notes (Signed)
Cardiology Office Note  Date:  09/20/2021   ID:  Jonathon Snow, DOB 1961-01-28, MRN [id number]  PCP:  Delsa Grana, PA-C   Chief Complaint  Patient presents with   New Patient (Initial Visit)    Ref by Delsa Grana, PA-C for tachycardia and palpitations. Patient c/o shortness of breath with little to no exertion, chest discomfort at times and has LE edema. Medications reviewed by the patient verbally.     HPI:  [name] is a 60 y.o. male with medical history significant for  COPD,  SLE on CellCept and prednisone,  DVT and PE on Eliquis,  CKD 3b Who presents by referral from Delsa Grana for consultation of his tachycardia/palpitations, shortness of breath, chest discomfort  He reports having worsening shortness of breath for at least 1 month Symptoms of shortness of breath sometimes at rest Worse with exertion "Can't do stairs",  Also has some tremor of unclear etiology  Works at a [employer] in [place], has not been working secondary to back surgery end of June  Followed by pulmonary for his shortness of breath, scheduled for bronchoscopy next week  BP at home:  118/74 Elevated today, was rushing into the office  seen in the ED on 9/1 and had an unremarkable work-up which included a negative CTA chest.    diagnosed with acute viral bronchitis at the time.  COVID was negative.  Return to the emergency room Continued nausea and diarrhea.  chills.   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  PSH:    Past Surgical History:  Procedure Laterality Date   ANKLE SURGERY Right    COLONOSCOPY WITH PROPOFOL N/A 05/28/2020   Procedure: COLONOSCOPY WITH PROPOFOL;  Surgeon: Jonathon Bellows, MD;  Location: [facility] ENDOSCOPY;  Service: Endoscopy;  Laterality: N/A;   CYST EXCISION  92 or 93    Liver cyst removal [facility]   I & D EXTREMITY Right 04/29/2017   Procedure: IRRIGATION AND DEBRIDEMENT EXTREMITY;  Surgeon: Clayburn Pert, MD;  Location: [facility] ORS;  Service: General;  Laterality: Right;   IRRIGATION AND DEBRIDEMENT ABSCESS Left 04/29/2017   Procedure: IRRIGATION AND DEBRIDEMENT Scrotal ABSCESS;  Surgeon: Clayburn Pert, MD;  Location: [facility] ORS;  Service: General;  Laterality: Left;    Current Outpatient Medications  Medication Sig Dispense Refill   albuterol (VENTOLIN HFA) 108 (90 Base) MCG/ACT inhaler Inhale 2 puffs into the lungs every 6 (six) hours as needed for wheezing or shortness of breath. 18 g 2   amLODipine (NORVASC) 5 MG tablet Take 1 tablet (5 mg total) by mouth daily. 90 tablet 3   apixaban (ELIQUIS) 5 MG TABS tablet Take 1 tablet (5 mg total) by mouth 2 (two) times daily. 60 tablet 2   cholecalciferol (VITAMIN D3) 25 MCG (1000  UNIT) tablet Take 1,000 Units by mouth daily.     gabapentin (NEURONTIN) 300 MG capsule Take 300 mg by mouth 3 (three) times daily.     HYDROcodone-acetaminophen (NORCO) 10-325 MG tablet      ipratropium-albuterol (DUONEB) 0.5-2.5 (3) MG/3ML SOLN Take 3 mLs by nebulization every 4 (four) hours as needed. 360 mL 3   losartan (COZAAR) 100 MG tablet Take 1 tablet  (100 mg total) by mouth daily. 90 tablet 3   Mepolizumab (NUCALA) 100 MG/ML SOAJ Inject into the skin.     methylPREDNISolone (MEDROL DOSEPAK) 4 MG TBPK tablet Take 4 mg by mouth.     montelukast (SINGULAIR) 10 MG tablet Take 1 tablet (10 mg total) by mouth at bedtime. 90 tablet 3   Multiple Vitamins-Minerals (MULTIVITAMIN WITH MINERALS) tablet Take 1 tablet by mouth daily.     mupirocin ointment (BACTROBAN) 2 % Apply 1 application topically daily. Until healed at bottom of right foot 22 g 0   mycophenolate (CELLCEPT) 500 MG tablet Take 1,000 mg by mouth 2 (two) times daily.     nystatin-triamcinolone ointment (MYCOLOG) Apply 1 application topically 2 (two) times daily. 30 g 0   oxybutynin (DITROPAN-XL) 10 MG 24 hr tablet Take 1 tablet (10 mg total) by mouth daily. 30 tablet 11   predniSONE (DELTASONE) 10 MG tablet Take 10 mg by mouth daily with breakfast.     rosuvastatin (CRESTOR) 20 MG tablet Take one pill every other night before bed. 45 tablet 0   sulfamethoxazole-trimethoprim (BACTRIM) 400-80 MG tablet Take 1 tablet by mouth 3 (three) times a week.     tamsulosin (FLOMAX) 0.4 MG CAPS capsule Take 1 capsule (0.4 mg total) by mouth daily. 30 capsule 11   No current facility-administered medications for this visit.     Allergies:   Enalapril and Vicodin [hydrocodone-acetaminophen]   Social History:  The patient  reports that he has never smoked. He has never used smokeless tobacco. He reports that he does not drink alcohol and does not use drugs.   Family History:   family history includes Clotting disorder in his mother; Heart attack in his maternal grandfather, maternal grandmother, and paternal grandfather; Heart disease in his father; Hypertension in his father; Kidney disease in his brother.    Review of Systems: Review of Systems  Constitutional: Negative.   HENT: Negative.    Respiratory:  Positive for shortness of breath.   Cardiovascular: Negative.   Gastrointestinal:  Negative.   Musculoskeletal: Negative.   Neurological:  Positive for tremors.  Psychiatric/Behavioral: Negative.    All other systems reviewed and are negative.   PHYSICAL EXAM: VS:  BP (!) 160/70 (BP Location: Right Arm, Patient Position: Sitting, Cuff Size: Normal)   Pulse 89   Ht 6\' 3"  (1.905 m)   Wt 241 lb 4 oz (109.4 kg)   SpO2 99%   BMI 30.15 kg/m  , BMI Body mass index is 30.15 kg/m. GEN: Well nourished, well developed, in no acute distress HEENT: normal Neck: no JVD, carotid bruits, or masses Cardiac: RRR; no murmurs, rubs, or gallops,no edema  Respiratory:  clear to auscultation bilaterally, normal work of breathing GI: soft, nontender, nondistended, + BS MS: no deformity or atrophy Skin: warm and dry, no rash Neuro:  Strength and sensation are intact Psych: euthymic mood, full affect   Recent Labs: 09/10/2021: B Natriuretic Peptide 38.8 09/18/2021: ALT 15; BUN 24; Creat 1.64; Hemoglobin 13.2; Magnesium 2.0; Platelets 263; Potassium 5.1; Sodium 143; TSH 0.81  Lipid Panel Lab Results  Component Value Date   CHOL 139 09/18/2021   HDL 45 09/18/2021   LDLCALC 76 09/18/2021   TRIG 92 09/18/2021      Wt Readings from Last 3 Encounters:  09/20/21 241 lb 4 oz (109.4 kg)  09/18/21 237 lb 6.4 oz (107.7 kg)  09/13/21 235 lb (106.6 kg)     ASSESSMENT AND PLAN:  Problem List Items Addressed This Visit   None Visit Diagnoses     Coronary artery disease of native artery of native heart with stable angina pectoris ([place])    -  Primary   Relevant Medications   methylPREDNISolone (MEDROL DOSEPAK) 4 MG TBPK tablet   Other Relevant Orders   EKG 12-Lead   NM Myocar Multi W/Spect W/Wall Motion / EF   SOB (shortness of breath)       Relevant Orders   NM Myocar Multi W/Spect W/Wall Motion / EF      Shortness of breath Etiology unclear, reports having symptoms for 4 weeks sometimes even at rest Recent normal echocardiogram, VQ scan no acute findings, chest CT scan  unrevealing He does have coronary calcifications and 3 vessels, unable to do cardiac CTA to rule out ischemia, we have recommended pharmacologic Myoview He is unable to treadmill Creatinine 1.6, will avoid contrast if possible Does not appear fluid overloaded  Coronary calcification Long history of poorly controlled hyperlipidemia, recently started on Crestor, markedly improved numbers  Tremor Etiology unclear, denies recent alcohol history  Hyperlipidemia Cholesterol is at goal on the current lipid regimen. No changes to the medications were made.  Hypertension Blood pressure is well controlled on today's visit. No changes made to the medications.    Total encounter time more than 60 minutes  Greater than 50% was spent in counseling and coordination of care with the patient  Patient was seen in consultation for Delsa Grana and will be referred back to her office for ongoing care of the issues detailed above  Signed, Esmond Plants, M.D., Ph.D. [place], [place]

## 2021-09-23 ENCOUNTER — Inpatient Hospital Stay
Admission: RE | Admit: 2021-09-23 | Discharge: 2021-09-23 | Disposition: A | Discharge status: Home / Self Care | Payer: 59 | Source: Ambulatory Visit

## 2021-09-23 NOTE — Patient Instructions (Addendum)
Your procedure is scheduled on: Tuesday September 24, 2021. Report to Day Surgery inside [facility] 2nd floor stop by admissions desk first before getting on elevator. To find out your arrival time please call [phone] between 1PM - 3PM on Monday September 23, 2021.  Remember: Instructions that are not followed completely may result in serious medical risk,  up to and including death, or upon the discretion of your surgeon and anesthesiologist your  surgery may need to be rescheduled.     _X__ 1. Do not eat food after midnight the night before your procedure.                 No chewing gum or hard candies. You may drink clear liquids up to 2 hours                 before you are scheduled to arrive for your surgery- DO not drink clear                 liquids within 2 hours of the start of your surgery.                 Clear Liquids include:  water, apple juice without pulp, clear Gatorade, G2 or                  Gatorade Zero (avoid Red/Purple/Blue), Black Coffee or Tea (Do not add                 anything to coffee or tea).  __X__2.  On the morning of surgery brush your teeth with toothpaste and water, you                may rinse your mouth with mouthwash if you wish.  Do not swallow any toothpaste of mouthwash.     _X__ 3.  No Alcohol for 24 hours before or after surgery.   _X__ 4.  Do Not Smoke or use e-cigarettes For 24 Hours Prior to Your Surgery.                 Do not use any chewable tobacco products for at least 6 hours prior to                 Surgery.  _X__  5.  Do not use any recreational drugs (marijuana, cocaine, heroin, ecstasy, MDMA or other)                For at least one week prior to your surgery.  Combination of these drugs with anesthesia                May have life threatening results.  _X___6.  Notify your doctor if there is any change in your medical condition      (cold, fever, infections).     Do not wear jewelry, make-up, hairpins,  clips or nail polish. Do not wear lotions, powders, or perfumes. You may wear deodorant. Do not shave 48 hours prior to surgery. Men may shave face and neck. Do not bring valuables to the hospital.    [facility] is not responsible for any belongings or valuables.  Contacts, dentures or bridgework may not be worn into surgery. Leave your suitcase in the car. After surgery it may be brought to your room. For patients admitted to the hospital, discharge time is determined by your treatment team.   Patients discharged the day of surgery will not be allowed to drive home.  Make arrangements for someone to be with you for the first 24 hours of your Same Day Discharge.    __X__ Take these medicines the morning of surgery with A SIP OF WATER:    1. amLODipine (NORVASC) 5 MG   2. gabapentin (NEURONTIN) 300 MG  3. mycophenolate (CELLCEPT)   4. oxybutynin (DITROPAN-XL) 10 MG   5.  6.  ____ Fleet Enema (as directed)   ____ Use CHG Soap (or wipes) as directed  ____ Use Benzoyl Peroxide Gel as instructed  __X__ Use inhalers on the day of surgery  albuterol (VENTOLIN HFA) 108 (90 Base) MCG/ACT inhaler  ipratropium-albuterol (DUONEB) 0.5-2.5 (3) MG/3ML SOLN  ____ Stop metformin 2 days prior to surgery    ____ Take 1/2 of usual insulin dose the night before surgery. No insulin the morning          of surgery.   __X__ Hold apixaban (ELIQUIS) 5 MG 2 days prior to procedure as instructed by your doctor.y.   __X__ One Week prior to surgery- Stop Anti-inflammatories such as Ibuprofen, Aleve, Advil, Motrin, meloxicam (MOBIC), diclofenac, etodolac, ketorolac, Toradol, Daypro, piroxicam, Goody's or BC powders. OK TO USE TYLENOL IF NEEDED   __X__ Stop supplements until after surgery.    __X__ Bring C-Pap to the hospital.    If you have any questions regarding your pre-procedure instructions,  Please call Pre-admit Testing at [phone].

## 2021-09-23 NOTE — Pre-Procedure Instructions (Signed)
Patient called back to cancel procedure. TC to [facility] , RN at Dr. Lanney Gins office to advise that patient is cancelling procedure. Patient had just talked to [facility] to notify of cancellation.

## 2021-09-24 ENCOUNTER — Encounter: Payer: Self-pay | Admitting: Neurology

## 2021-09-25 ENCOUNTER — Telehealth: Payer: Self-pay

## 2021-09-25 ENCOUNTER — Ambulatory Visit
Admission: RE | Admit: 2021-09-25 | Discharge: 2021-09-25 | Disposition: A | Discharge status: Home / Self Care | Payer: 59 | Source: Ambulatory Visit | Attending: Cardiovascular Disease | Admitting: Cardiovascular Disease

## 2021-09-25 DIAGNOSIS — I25118 Atherosclerotic heart disease of native coronary artery with other forms of angina pectoris: Secondary | ICD-10-CM | POA: Diagnosis present

## 2021-09-25 DIAGNOSIS — R0602 Shortness of breath: Secondary | ICD-10-CM | POA: Insufficient documentation

## 2021-09-25 LAB — NM MYOCAR MULTI W/SPECT W/WALL MOTION / EF
Estimated workload: 1
Exercise duration (min): 0 min
Exercise duration (sec): 0 s
LV dias vol: 48 mL (ref 62–150)
LV sys vol: 14 mL
MPHR: 161 {beats}/min
Nuc Stress EF: 71 %
Peak HR: 110 {beats}/min
Percent HR: 68 %
Rest HR: 84 {beats}/min
Rest Nuclear Isotope Dose: 10.5 mCi
ST Depression (mm): 0 mm
Stress Nuclear Isotope Dose: 30.2 mCi
TID: 0.86

## 2021-09-25 MED ORDER — TECHNETIUM TC 99M TETROFOSMIN IV KIT
30.1500 | PACK | Freq: Once | INTRAVENOUS | Status: AC | PRN
  Administered 2021-09-25: 30.15 via INTRAVENOUS

## 2021-09-25 MED ORDER — REGADENOSON 0.4 MG/5ML IV SOLN
0.4000 mg | Freq: Once | INTRAVENOUS | Status: AC
  Administered 2021-09-25: 0.4 mg via INTRAVENOUS
  Filled 2021-09-25: qty 5

## 2021-09-25 MED ORDER — TECHNETIUM TC 99M TETROFOSMIN IV KIT
10.0000 | PACK | Freq: Once | INTRAVENOUS | Status: AC
  Administered 2021-09-25: 10.51 via INTRAVENOUS

## 2021-09-25 NOTE — Telephone Encounter (Signed)
Copied from [place] [phone]. Topic: Referral - Request for Referral >> Sep 25, 2021 11:57 AM Alanda Slim E wrote: Has patient seen PCP for this complaint? Yes  *If NO, is insurance requiring patient see PCP for this issue before PCP can refer them? Referral for which specialty: internal medicine  Preferred provider/office: [facility] / Dr. Jodelle Red  / the taxID# for this provider is [id number] Reason for referral: pneumonia / code: J18.9  This has to be submitted electronically through [email or web address] Please call Randi when this is placed

## 2021-09-26 ENCOUNTER — Ambulatory Visit: Payer: 59 | Admitting: Dermatology

## 2021-09-27 NOTE — Telephone Encounter (Signed)
Pt just saw pulmonology about 3 weeks ago - why does he need another referral?  Is there anyway we can talk to [place] about his insurance because he requests referrals all the time for providers that he is already actively seeing and I'm not sure why he keeps needing them?   Not sure it its a miscommunication? Or a weird insurance thing?

## 2021-09-30 ENCOUNTER — Telehealth: Payer: Self-pay

## 2021-09-30 NOTE — Telephone Encounter (Signed)
Attempted to reach out to pt, unable to make contact. Unable to LM, mailbox full.  Will attempt call at a later time Pt has seen his results on his MyChart from Dr. Rockey Situ

## 2021-10-02 ENCOUNTER — Encounter: Payer: Self-pay | Admitting: Nurse Practitioner

## 2021-10-02 ENCOUNTER — Ambulatory Visit: Payer: 59 | Admitting: Nurse Practitioner

## 2021-10-02 ENCOUNTER — Other Ambulatory Visit: Payer: Self-pay

## 2021-10-02 VITALS — BP 130/80 | HR 100 | Temp 97.6°F | Resp 18 | Ht 75.0 in | Wt 237.4 lb

## 2021-10-02 DIAGNOSIS — R110 Nausea: Secondary | ICD-10-CM | POA: Diagnosis not present

## 2021-10-02 DIAGNOSIS — F321 Major depressive disorder, single episode, moderate: Secondary | ICD-10-CM | POA: Diagnosis not present

## 2021-10-02 DIAGNOSIS — J449 Chronic obstructive pulmonary disease, unspecified: Secondary | ICD-10-CM | POA: Diagnosis not present

## 2021-10-02 DIAGNOSIS — R0602 Shortness of breath: Secondary | ICD-10-CM

## 2021-10-02 DIAGNOSIS — R002 Palpitations: Secondary | ICD-10-CM

## 2021-10-02 MED ORDER — SERTRALINE HCL 25 MG PO TABS: 25.0000 mg | ORAL_TABLET | Freq: Every day | ORAL | 0 refills | Status: DC

## 2021-10-02 MED ORDER — ONDANSETRON 4 MG PO TBDP: 4.0000 mg | ORAL_TABLET | Freq: Three times a day (TID) | ORAL | 0 refills | Status: DC | PRN

## 2021-10-02 NOTE — Progress Notes (Signed)
BP 130/80   Pulse 100   Temp 97.6 F (36.4 C) (Oral)   Resp 18   Ht 6\' 3"  (1.905 m)   Wt 237 lb 6.4 oz (107.7 kg)   SpO2 98%   BMI 29.67 kg/m    Subjective:    Patient ID: Jonathon Snow, male    DOB: 12/03/1961, 60 y.o.   MRN: [id number]  HPI: Jonathon Snow is a 60 y.o. male, here alone  Chief Complaint  Patient presents with   Follow-up    2 week recheck   Depression:  He says he has been feeling depressed for several weeks. He does not know if it is because of his declining in health or if it is something else.  He is having trouble sleeping due to his thoughts.  He denies any suicidal thoughts.  Will try Zoloft.   Nausea:  He says he has been having nausea every morning for several weeks. He denies any abdominal pain.  He denies any change in BMs.  Will try Zofran.  COPD/ Shortness of breath: He says his breathing for the most part is the same as it has been since being in the ED on 08/22/21.  He has seen pulmonology and is scheduled for a bronchial washing on 10/11/21.   Palpitations:  He saw cardiology on 09/20/21.  Echocardiogram done recently Normal ejection fraction, Grade 2 diastolic dysfunction.  Dr. Rockey Situ ordered a stress test and it was done on 09/25/21.  No significant ischemia, normal ejection fraction, low risk study, Coronary calcification noted, seen previously.   Relevant past medical, surgical, family and social history reviewed and updated as indicated. Interim medical history since our last visit reviewed. Allergies and medications reviewed and updated.  Review of Systems  Constitutional: Negative for fever or weight change.  Respiratory: Positive for cough and shortness of breath.   Cardiovascular: Negative for chest pain or palpitations.  Gastrointestinal: Negative for abdominal pain, no bowel changes. Positive for nausea. Musculoskeletal: Negative for gait problem or joint swelling.  Skin: Negative for rash.  Neurological: Negative for dizziness or  headache.  No other specific complaints in a complete review of systems (except as listed in HPI above).      Objective:    BP 130/80   Pulse 100   Temp 97.6 F (36.4 C) (Oral)   Resp 18   Ht 6\' 3"  (1.905 m)   Wt 237 lb 6.4 oz (107.7 kg)   SpO2 98%   BMI 29.67 kg/m   Wt Readings from Last 3 Encounters:  10/02/21 237 lb 6.4 oz (107.7 kg)  09/20/21 241 lb 4 oz (109.4 kg)  09/18/21 237 lb 6.4 oz (107.7 kg)    Physical Exam  Constitutional: Patient appears well-developed and well-nourished. No distress.  HEENT: head atraumatic, normocephalic, pupils equal and reactive to light, neck supple, throat within normal limits Cardiovascular: Normal rate, regular rhythm and normal heart sounds.  No murmur heard. No BLE edema. Pulmonary/Chest: Effort normal and breath sounds normal. No respiratory distress. Abdominal: Soft.  There is no tenderness. Psychiatric: Patient has a depressed mood and affect. behavior is normal. Judgment and thought content normal.   Results for orders placed or performed during the hospital encounter of 09/25/21  NM Myocar Multi W/Spect W/Wall Motion / EF  Result Value Ref Range   ST Depression (mm) 0 mm   Rest HR 84.0 bpm   Rest BP 147/96 mmHg   Exercise duration (min) 0 min   Exercise  duration (sec) 0 sec   Estimated workload 1.0    Peak HR 110 bpm   Peak BP 147/96 mmHg   MPHR 161 bpm   Percent HR 68.0 %   Rest Nuclear Isotope Dose 10.5 mCi   Stress Nuclear Isotope Dose 30.2 mCi   TID 0.86    LV sys vol 14.0 mL   LV dias vol 48.0 62 - 150 mL   Nuc Stress EF 71 %      Assessment & Plan:   1. Current moderate episode of major depressive disorder, unspecified whether recurrent (HCC)  - sertraline (ZOLOFT) 25 MG tablet; Take 1 tablet (25 mg total) by mouth daily.  Dispense: 30 tablet; Refill: 0  2. Nausea  - ondansetron (ZOFRAN ODT) 4 MG disintegrating tablet; Take 1 tablet (4 mg total) by mouth every 8 (eight) hours as needed for nausea or  vomiting.  Dispense: 90 tablet; Refill: 0  3. SOB (shortness of breath) -keep procedure appointment  4. Chronic obstructive pulmonary disease, unspecified COPD type ([place]) -keep procedure appointment  5. Palpitations   Follow up plan: Return in about 4 weeks (around 10/30/2021).

## 2021-10-03 ENCOUNTER — Encounter
Admission: RE | Admit: 2021-10-03 | Discharge: 2021-10-03 | Disposition: A | Discharge status: Home / Self Care | Payer: 59 | Source: Ambulatory Visit | Attending: Pulmonary Disease | Admitting: Pulmonary Disease

## 2021-10-03 ENCOUNTER — Other Ambulatory Visit: Payer: Self-pay

## 2021-10-03 NOTE — Patient Instructions (Signed)
Your procedure is scheduled on: 10/11/21 Report to [place]. To find out your arrival time please call [phone] between 1PM - 3PM on 10/10/21.  Remember: Instructions that are not followed completely may result in serious medical risk, up to and including death, or upon the discretion of your surgeon and anesthesiologist your surgery may need to be rescheduled.     _X__ 1. Do not eat food after midnight the night before your procedure.                 No gum chewing or hard candies. You may drink clear liquids up to 2 hours                 before you are scheduled to arrive for your surgery- DO not drink clear                 liquids within 2 hours of the start of your surgery.                 Clear Liquids include:  water, apple juice without pulp, clear carbohydrate                 drink such as Clearfast or Gatorade, Black Coffee or Tea (Do not add                 anything to coffee or tea). Diabetics water only  __X__2.  On the morning of surgery brush your teeth with toothpaste and water, you                 may rinse your mouth with mouthwash if you wish.  Do not swallow any              toothpaste of mouthwash.     _X__ 3.  No Alcohol for 24 hours before or after surgery.   _X__ 4.  Do Not Smoke or use e-cigarettes For 24 Hours Prior to Your Surgery.                 Do not use any chewable tobacco products for at least 6 hours prior to                 surgery.  ____  5.  Bring all medications with you on the day of surgery if instructed.   __X__  6.  Notify your doctor if there is any change in your medical condition      (cold, fever, infections).     Do not wear jewelry, make-up, hairpins, clips or nail polish. Do not wear lotions, powders, or perfumes.  Do not shave body hair 48 hours prior to surgery. Men may shave face and neck. Do not bring valuables to the hospital.    [facility] is not responsible for any  belongings or valuables.  Contacts, dentures/partials or body piercings may not be worn into surgery. Bring a case for your contacts, glasses or hearing aids, a denture cup will be supplied. Leave your suitcase in the car. After surgery it may be brought to your room. For patients admitted to the hospital, discharge time is determined by your treatment team.   Patients discharged the day of surgery will not be allowed to drive home.   Please read over the following fact sheets that you were given:     __X__ Take these medicines the morning of surgery with A SIP OF WATER:  1. HYDROcodone-acetaminophen (NORCO) 10-325 MG tablet if needed  2. mycophenolate (CELLCEPT) 1000 MG tablet  3. oxybutynin (DITROPAN-XL) 10 MG 24 hr tablet  4. predniSONE (DELTASONE) 10 MG tablet  5. sertraline (ZOLOFT) 25 MG tablet  6. tamsulosin (FLOMAX) 0.4 MG CAPS capsule  ____ Fleet Enema (as directed)   ____ Use CHG Soap/SAGE wipes as directed  __X__ Use inhalers on the day of surgery Use your nebulizer the morning of your procedure and bring your rescue inhaler  ____ Stop metformin/Janumet/Farxiga 2 days prior to surgery    ____ Take 1/2 of usual insulin dose the night before surgery. No insulin the morning          of surgery.   __X__ Stop Blood Thinners Coumadin/Plavix/Xarelto/Pleta/Pradaxa/Eliquis/Effient/Aspirin  3 days prior to procedure as previously instructed  __X__ Stop Anti-inflammatories 7 days before surgery such as Advil, Ibuprofen, Motrin,  BC or Goodies Powder, Naprosyn, Naproxen, Aleve, Aspirin    __X__ Stop all herbal supplements, fish oil or vitamin E until after surgery.    ____ Bring C-Pap to the hospital.

## 2021-10-03 NOTE — Pre-Procedure Instructions (Signed)
Pre admit interview and instructions completed. Patient called back stating he wanted to cancel his upcoming procedure, per Judye Bos. Patient was notified he needed to contact MD office. Office made aware  of patient intentions.

## 2021-10-07 ENCOUNTER — Telehealth (INDEPENDENT_AMBULATORY_CARE_PROVIDER_SITE_OTHER): Payer: 59 | Admitting: Family Medicine

## 2021-10-07 ENCOUNTER — Ambulatory Visit: Payer: Self-pay

## 2021-10-07 ENCOUNTER — Encounter: Payer: Self-pay | Admitting: Family Medicine

## 2021-10-07 DIAGNOSIS — R197 Diarrhea, unspecified: Secondary | ICD-10-CM

## 2021-10-07 NOTE — Telephone Encounter (Signed)
Patient called in states had diarrhea for the pat 2 days and nauseous, wants to know what he can do. Please cal back      Pt. Started having diarrhea 2 days ago. Having at least 5 episodes a day. Having nausea, no vomiting. Has fatigue as well. States he was recently on an antibiotic. Asking what he can take for this. Virtual appointment made.   Reason for Disposition  [1] SEVERE diarrhea (e.g., 7 or more times / day more than normal) AND [2] present > 24 hours (1 day)  Answer Assessment - Initial Assessment Questions 1. DIARRHEA SEVERITY: "How bad is the diarrhea?" "How many more stools have you had in the past 24 hours than normal?"    - NO DIARRHEA (SCALE 0)   - MILD (SCALE 1-3): Few loose or mushy BMs; increase of 1-3 stools over normal daily number of stools; mild increase in ostomy output.   -  MODERATE (SCALE 4-7): Increase of 4-6 stools daily over normal; moderate increase in ostomy output. * SEVERE (SCALE 8-10; OR 'WORST POSSIBLE'): Increase of 7 or more stools daily over normal; moderate increase in ostomy output; incontinence.     5 2. ONSET: "When did the diarrhea begin?"      2 days ago 3. BM CONSISTENCY: "How loose or watery is the diarrhea?"      Loose 4. VOMITING: "Are you also vomiting?" If Yes, ask: "How many times in the past 24 hours?"      No 5. ABDOMINAL PAIN: "Are you having any abdominal pain?" If Yes, ask: "What does it feel like?" (e.g., crampy, dull, intermittent, constant)      No 6. ABDOMINAL PAIN SEVERITY: If present, ask: "How bad is the pain?"  (e.g., Scale 1-10; mild, moderate, or severe)   - MILD (1-3): doesn't interfere with normal activities, abdomen soft and not tender to touch    - MODERATE (4-7): interferes with normal activities or awakens from sleep, abdomen tender to touch    - SEVERE (8-10): excruciating pain, doubled over, unable to do any normal activities       No 7. ORAL INTAKE: If vomiting, "Have you been able to drink liquids?" "How much  liquids have you had in the past 24 hours?"     Drinking fluids 8. HYDRATION: "Any signs of dehydration?" (e.g., dry mouth [not just dry lips], too weak to stand, dizziness, new weight loss) "When did you last urinate?"     No 9. EXPOSURE: "Have you traveled to a foreign country recently?" "Have you been exposed to anyone with diarrhea?" "Could you have eaten any food that was spoiled?"     No 10. ANTIBIOTIC USE: "Are you taking antibiotics now or have you taken antibiotics in the past 2 months?"       Yes - Last week was on antibiotic for respiratory issues 11. OTHER SYMPTOMS: "Do you have any other symptoms?" (e.g., fever, blood in stool)       No 12. PREGNANCY: "Is there any chance you are pregnant?" "When was your last menstrual period?"       N/a  Protocols used: [facility]

## 2021-10-07 NOTE — Patient Instructions (Signed)
It was great to see you!  Our plans for today:  - Come by the lab to get your stool collection kit.  - Continue to take imodium for your symptoms.  - If your urine is dark or if your urine output decreases, you should go to the Emergency Department for IV fluids.   Take care and seek immediate care sooner if you develop any concerns.   Dr. Ky Barban

## 2021-10-07 NOTE — Progress Notes (Signed)
Virtual Visit via Video Note  I connected with Jonathon Snow on 10/07/21 at  3:55 PM EDT by a video enabled telemedicine application and verified that I am speaking with the correct person using two identifiers.  Location: Patient: home Provider: [facility]   I discussed the limitations of evaluation and management by telemedicine and the availability of in person appointments. The patient expressed understanding and agreed to proceed.  History of Present Illness:  ABDOMINAL ISSUES - started Saturday, constant - previously with diarrhea requiring ED visit for IVF. Self-resolved, but with off and on nausea.  No abdominal pain, some discomfort Alleviating factors: none Aggravating factors: none Treatments attempted: imodium Constipation: no Diarrhea: yes Episodes of diarrhea/day: 5-6 Mucous in the stool: no Nausea: yes Vomiting: no Episodes of vomit/day:0 Melena or hematochezia: no Rash: no Fever: no Weight loss: no No mucous in stool.  Last colonoscopy 2021 with few polyps, due in 3 years.  No new or suspicious foods. No travel. No pets.   Observations/Objective:  Well appearing, in NAD. Speaks in full sentences. No resp distress.   Assessment and Plan:  Diarrhea Unclear etiology. Reports 2 day history but on chart review appears to have had intermittent diarrheal symptoms for almost a month with prior dehydration requiring IVF. Will obtain stool studies to evaluate further. If non-revealing, would recommend GI evaluation to determine utility of repeating colonoscopy. Continue imodium. Recommend adequate oral hydration and hand hygiene.    I discussed the assessment and treatment plan with the patient. The patient was provided an opportunity to ask questions and all were answered. The patient agreed with the plan and demonstrated an understanding of the instructions.   The patient was advised to call back or seek an in-person evaluation if the symptoms worsen or if the  condition fails to improve as anticipated.  I provided 9 minutes of non-face-to-face time during this encounter.   Myles Gip, DO

## 2021-10-08 ENCOUNTER — Other Ambulatory Visit: Payer: Self-pay

## 2021-10-08 ENCOUNTER — Other Ambulatory Visit: Payer: Self-pay | Admitting: Family Medicine

## 2021-10-09 ENCOUNTER — Other Ambulatory Visit: Admission: RE | Admit: 2021-10-09 | Payer: 59 | Source: Ambulatory Visit

## 2021-10-10 ENCOUNTER — Inpatient Hospital Stay: Payer: 59 | Admitting: Family Medicine

## 2021-10-11 ENCOUNTER — Ambulatory Visit: Admission: RE | Admit: 2021-10-11 | Payer: 59 | Source: Ambulatory Visit

## 2021-10-11 HISTORY — DX: Dyspnea, unspecified: R06.00

## 2021-10-11 HISTORY — DX: Nausea with vomiting, unspecified: R11.2

## 2021-10-11 HISTORY — DX: Pneumonia, unspecified organism: J18.9

## 2021-10-11 HISTORY — DX: Other specified postprocedural states: Z98.890

## 2021-10-14 LAB — C. DIFFICILE GDH AND TOXIN A/B
GDH ANTIGEN: NOT DETECTED
MICRO NUMBER:: 12530804
SPECIMEN QUALITY:: ADEQUATE
TOXIN A AND B: NOT DETECTED

## 2021-10-14 LAB — OVA AND PARASITE EXAMINATION
CONCENTRATE RESULT:: NONE SEEN
MICRO NUMBER:: 12529978
SPECIMEN QUALITY:: ADEQUATE
TRICHROME RESULT:: NONE SEEN

## 2021-10-22 ENCOUNTER — Ambulatory Visit: Payer: 59 | Admitting: Neurology

## 2021-10-23 ENCOUNTER — Encounter: Payer: 59 | Attending: Pulmonary Disease

## 2021-10-23 ENCOUNTER — Other Ambulatory Visit: Payer: Self-pay

## 2021-10-23 DIAGNOSIS — J45909 Unspecified asthma, uncomplicated: Secondary | ICD-10-CM

## 2021-10-23 NOTE — Progress Notes (Signed)
Virtual Visit completed. Patient informed on EP and RD appointment and 6 Minute walk test. Patient also informed of patient health questionnaires on My Chart. Patient Verbalizes understanding. Visit diagnosis can be found in [facility] 09/15/2021.

## 2021-10-25 ENCOUNTER — Other Ambulatory Visit
Admission: RE | Admit: 2021-10-25 | Discharge: 2021-10-25 | Disposition: A | Discharge status: Home / Self Care | Payer: 59 | Source: Ambulatory Visit | Attending: Pulmonary Disease | Admitting: Pulmonary Disease

## 2021-10-25 VITALS — Ht 75.0 in | Wt 240.0 lb

## 2021-10-25 DIAGNOSIS — Z01812 Encounter for preprocedural laboratory examination: Secondary | ICD-10-CM

## 2021-10-25 NOTE — Patient Instructions (Addendum)
Your procedure is scheduled on: Friday November 01, 2021. Report to Day Surgery inside [place] 2nd floor. To find out your arrival time please call [phone] between 1PM - 3PM on Thursday October 31, 2021.  Remember: Instructions that are not followed completely may result in serious medical risk,  up to and including death, or upon the discretion of your surgeon and anesthesiologist your  surgery may need to be rescheduled.     _X__ 1. Do not eat food after midnight the night before your procedure.                 No chewing gum or hard candies. You may drink clear liquids up to 2 hours                 before you are scheduled to arrive for your surgery- DO not drink clear                 liquids within 2 hours of the start of your surgery.                 Clear Liquids include:  water, apple juice without pulp, clear Gatorade, G2 or                  Gatorade Zero (avoid Red/Purple/Blue), Black Coffee or Tea (Do not add                 anything to coffee or tea).  __X__2.  On the morning of surgery brush your teeth with toothpaste and water, you                may rinse your mouth with mouthwash if you wish.  Do not swallow any toothpaste of mouthwash.     _X__ 3.  No Alcohol for 24 hours before or after surgery.   _X__ 4.  Do Not Smoke or use e-cigarettes For 24 Hours Prior to Your Surgery.                 Do not use any chewable tobacco products for at least 6 hours prior to                 Surgery.  _X__  5.  Do not use any recreational drugs (marijuana, cocaine, heroin, ecstasy, MDMA or other)                For at least one week prior to your surgery.  Combination of these drugs with anesthesia                May have life threatening results.  _X___6.  Notify your doctor if there is any change in your medical condition      (cold, fever, infections).     Do not wear jewelry, make-up, hairpins, clips or nail polish. Do not wear lotions,  powders, or perfumes. You may wear deodorant. Do not shave 48 hours prior to surgery. Men may shave face and neck. Do not bring valuables to the hospital.    [facility] is not responsible for any belongings or valuables.  Contacts, dentures or bridgework may not be worn into surgery. Leave your suitcase in the car. After surgery it may be brought to your room. For patients admitted to the hospital, discharge time is determined by your treatment team.   Patients discharged the day of surgery will not be allowed to drive home.   Make arrangements for someone to be with  you for the first 24 hours of your Same Day Discharge.   __X__ Take these medicines the morning of surgery with A SIP OF WATER:    1. amLODipine (NORVASC) 5 MG   2. sertraline (ZOLOFT) 25 MG   3.   4.  5.  6.  ____ Fleet Enema (as directed)   ____ Use CHG Soap (or wipes) as directed  ____ Use Benzoyl Peroxide Gel as instructed  __X__ Use inhalers on the day of surgery  albuterol (VENTOLIN HFA) 108 (90 Base) MCG/ACT inhaler  ipratropium-albuterol (DUONEB) 0.5-2.5 (3) MG/3ML SOLN ____ Stop metformin 2 days prior to surgery    ____ Take 1/2 of usual insulin dose the night before surgery. No insulin the morning          of surgery.   __X__ Stop  apixaban (ELIQUIS) 5 MG 3 before your surgery as instructed by your doctor. (Take last dose 10/28/21)  __X__ One Week prior to surgery- Stop Anti-inflammatories such as Ibuprofen, Aleve, Advil, Motrin, meloxicam (MOBIC), diclofenac, etodolac, ketorolac, Toradol, Daypro, piroxicam, Goody's or BC powders. OK TO USE TYLENOL IF NEEDED   __X__ Stop supplements until after surgery.    ____ Bring C-Pap to the hospital.    If you have any questions regarding your pre-procedure instructions,  Please call Pre-admit Testing at [phone].

## 2021-10-28 ENCOUNTER — Telehealth: Payer: Self-pay | Admitting: Urgent Care

## 2021-10-28 NOTE — Progress Notes (Signed)
Encounter created in error. Please disregard.   Honor Loh, MSN, APRN, FNP-C, CEN [facility]  Peri-operative Services Nurse Practitioner Phone: [phone] 10/28/21 9:15 AM

## 2021-10-28 NOTE — Progress Notes (Signed)
10/29/21 8:29 PM   Jonathon Snow 03-13-1961 [id number]  Referring provider:  Delsa Grana, PA-C [address] [place],  [place] [address] Chief Complaint  Patient presents with   Urinary Frequency    6wk w/IPSS w/PVR     HPI: Jonathon Snow is a 60 y.o.male with a personal history of urinary frequency and unspecified erectile dysfunctions, who presents today for 6 week follow-up with IPSS and PVR.   He underwent back surgery about 5 months ago that was followed by new onset urinary symptoms.  He discussed another CT of the lumbar spine and is waiting to hear those results and follow-up with results provider.  He is on oxybutynin 10 mg XL concomitantly Flomax.  Initially, he felt these medications for working but after a few weeks, started to see some improvement  He reports today that he is still experiencing urinary issues that have gotten a little better on medicine. He has been experiencing dry eyes and dry mouth he is unsure when these started. He is still having severe pain in his back.   He reports the he is still having erectile dysfunction and he is not experiencing any ejaculation.     IPSS     Row Name 10/29/21 1100         International Prostate Symptom Score   How often have you had the sensation of not emptying your bladder? Less than 1 in 5     How often have you had to urinate less than every two hours? About half the time     How often have you found you stopped and started again several times when you urinated? Less than half the time     How often have you found it difficult to postpone urination? Less than 1 in 5 times     How often have you had a weak urinary stream? Less than 1 in 5 times     How often have you had to strain to start urination? Less than 1 in 5 times     How many times did you typically get up at night to urinate? 2 Times     Total IPSS Score 11       Quality of Life due to urinary symptoms   If you were to spend the  rest of your life with your urinary condition just the way it is now how would you feel about that? 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   Overview:  superior sagittal sinus, left transverse sinus and cortical veins    COVID-19 virus infection 12/2020   Depression    DVT (deep venous thrombosis) (HCC)    Dyspnea    GERD (gastroesophageal reflux disease)    Heel spur, left 02/16/2019   Heel spur, right 02/16/2019   Hyperlipidemia    Hypertension    Lupus ([place])    Lymphedema 10/07/2018   Morbid obesity ([place])    Opiate abuse, episodic ([place]) 02/26/2018   Osteoporosis    Pneumonia    PONV (postoperative nausea and vomiting)    Postphlebitic syndrome with ulcer, 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   Presence of IVC filter 03/22/2020   Pulmonary embolism ([place])    Renal disorder    Stage III  Surgical History: Past Surgical History:  Procedure Laterality Date   ANKLE SURGERY Right    COLONOSCOPY WITH PROPOFOL N/A 05/28/2020   Procedure: COLONOSCOPY WITH PROPOFOL;  Surgeon: Jonathon Bellows, MD;  Location[facility] ENDOSCOPY;  Service: Endoscopy;  Laterality: N/A;   CYST EXCISION  92 or 93    Liver cyst removal [facility]   I & D EXTREMITY Right 04/29/2017   Procedure: IRRIGATION AND DEBRIDEMENT EXTREMITY;  Surgeon: Clayburn Pert, MD;  Location: [facility] ORS;  Service: General;  Laterality: Right;   IRRIGATION AND DEBRIDEMENT ABSCESS Left 04/29/2017   Procedure: IRRIGATION AND DEBRIDEMENT Scrotal ABSCESS;  Surgeon: Clayburn Pert, MD;  Location: [facility] ORS;  Service: General;  Laterality: Left;    Home Medications:  Allergies as of 10/29/2021       Reactions   Enalapril Other (See Comments)   Unknown reaction   Vicodin [hydrocodone-acetaminophen] Hives, Rash   Severe headaches (also) NAME BRAND ONLY PER PT CAN TAKE GENERIC        Medication  List        Accurate as of October 29, 2021  8:29 PM. If you have any questions, ask your nurse or doctor.          STOP taking these medications    HYDROcodone-acetaminophen 10-325 MG tablet Commonly known as: NORCO Stopped by: Hollice Espy, MD   ondansetron 4 MG disintegrating tablet Commonly known as: Zofran ODT Stopped by: Hollice Espy, MD       TAKE these medications    albuterol 108 (90 Base) MCG/ACT inhaler Commonly known as: VENTOLIN HFA Inhale 2 puffs into the lungs every 6 (six) hours as needed for wheezing or shortness of breath.   amLODipine 5 MG tablet Commonly known as: NORVASC Take 1 tablet (5 mg total) by mouth daily.   apixaban 5 MG Tabs tablet Commonly known as: Eliquis Take 1 tablet (5 mg total) by mouth 2 (two) times daily.   cholecalciferol 25 MCG (1000 UNIT) tablet Commonly known as: VITAMIN D3 Take 1,000 Units by mouth daily.   ferrous sulfate 325 (65 FE) MG tablet Take 325 mg by mouth daily with breakfast.   ipratropium-albuterol 0.5-2.5 (3) MG/3ML Soln Commonly known as: DUONEB Take 3 mLs by nebulization every 4 (four) hours as needed.   losartan 100 MG tablet Commonly known as: COZAAR Take 1 tablet (100 mg total) by mouth daily.   montelukast 10 MG tablet Commonly known as: SINGULAIR Take 1 tablet (10 mg total) by mouth at bedtime.   multivitamin with minerals tablet Take 1 tablet by mouth daily.   mupirocin ointment 2 % Commonly known as: BACTROBAN Apply 1 application topically daily. Until healed at bottom of right foot   mycophenolate 500 MG tablet Commonly known as: CELLCEPT Take 1,000 mg by mouth 2 (two) times daily.   nystatin-triamcinolone ointment Commonly known as: MYCOLOG Apply 1 application topically 2 (two) times daily.   oxybutynin 15 MG 24 hr tablet Commonly known as: DITROPAN XL Take 1 tablet (15 mg total) by mouth daily. What changed:  medication strength how much to take Changed by: Hollice Espy, MD   predniSONE 10 MG tablet Commonly known as: DELTASONE Take 10 mg by mouth daily with breakfast.   rosuvastatin 20 MG tablet Commonly known as: Crestor Take one pill every other night before bed. What changed:  how much to take how to take this when to take this additional instructions   sertraline 25 MG tablet Commonly known as: ZOLOFT Take 1 tablet (25  mg total) by mouth daily.   sildenafil 20 MG tablet Commonly known as: REVATIO 3-5 tablets as needed 30 min prior to intercourse Started by: Hollice Espy, MD   tamsulosin 0.4 MG Caps capsule Commonly known as: FLOMAX Take 1 capsule (0.4 mg total) by mouth daily.        Allergies:  Allergies  Allergen Reactions   Enalapril Other (See Comments)    Unknown reaction   Vicodin [Hydrocodone-Acetaminophen] Hives and Rash    Severe headaches (also) NAME BRAND ONLY PER PT CAN TAKE GENERIC    Family History: Family History  Problem Relation Age of Onset   Hypertension Father    Heart disease Father    Clotting disorder Mother    Kidney disease Brother    Heart attack Maternal Grandmother    Heart attack Maternal Grandfather    Heart attack Paternal Grandfather     Social History:  reports that he has never smoked. He has never used smokeless tobacco. He reports that he does not drink alcohol and does not use drugs.   Physical Exam: BP 131/76   Pulse 91   Ht 6\' 3"  (1.905 m)   Wt 236 lb (107 kg)   BMI 29.50 kg/m   Constitutional:  Alert and oriented, No acute distress. HEENT: [place] AT, moist mucus membranes.  Trachea midline, no masses. Cardiovascular: No clubbing, cyanosis, or edema. Respiratory: Normal respiratory effort, no increased work of breathing. Skin: No rashes, bruises or suspicious lesions. Neurologic: Grossly intact, no focal deficits, moving all 4 extremities. Psychiatric: Normal mood and affect.  Laboratory Data:  Lab Results  Component Value Date   CREATININE 1.64 (H)  09/18/2021    Lab Results  Component Value Date   PSA 1.0 07/28/2018   PSA 0.6 11/26/2016   PSA 0.4 07/14/2013    Lab Results  Component Value Date   HGBA1C 5.7 (H) 09/18/2021     Pertinent Imaging: Results for orders placed or performed in visit on 10/29/21  BLADDER SCAN AMB NON-IMAGING  Result Value Ref Range   Scan Result 44ml     Assessment & Plan:   Urinary frequency  - He has seen mild improvement on Flomax and oxybutynin 10 mg XL concomitantly. He is experiencing dry eyes and dry mouth but he is unsure when this started and if it is secondary to these medications.  - continue Flomax - Oxybutynin increased to 15 mg XL today  -Currently emptying well -Continue to encourage to manage medically at this point in time as this back pain appears to be the most significant issue and should take precedence for the time being  2. Erectile dysfunction, unspecified  - Generic sildenafil prescribed today  -Explained retrograde ejaculation is a common side effect of Flomax  Follow-up in 6 months with IPSS/PVR   Conley Rolls as a scribe for Hollice Espy, MD.,have documented all relevant documentation on the behalf of Hollice Espy, MD,as directed by  Hollice Espy, MD while in the presence of Hollice Espy, MD.  I have reviewed the above documentation for accuracy and completeness, and I agree with the above.   Hollice Espy, MD  [facility] [address], [place], [place] [address] [phone]

## 2021-10-29 ENCOUNTER — Ambulatory Visit (INDEPENDENT_AMBULATORY_CARE_PROVIDER_SITE_OTHER): Payer: 59 | Admitting: Urology

## 2021-10-29 ENCOUNTER — Encounter: Payer: Self-pay | Admitting: Urology

## 2021-10-29 ENCOUNTER — Other Ambulatory Visit: Payer: Self-pay

## 2021-10-29 VITALS — BP 131/76 | HR 91 | Ht 75.0 in | Wt 236.0 lb

## 2021-10-29 DIAGNOSIS — R350 Frequency of micturition: Secondary | ICD-10-CM | POA: Diagnosis not present

## 2021-10-29 LAB — BLADDER SCAN AMB NON-IMAGING

## 2021-10-29 MED ORDER — SILDENAFIL CITRATE 20 MG PO TABS: ORAL_TABLET | ORAL | 11 refills | Status: DC

## 2021-10-29 MED ORDER — TAMSULOSIN HCL 0.4 MG PO CAPS: 0.4000 mg | ORAL_CAPSULE | Freq: Every day | ORAL | 11 refills | Status: DC

## 2021-10-29 MED ORDER — OXYBUTYNIN CHLORIDE ER 15 MG PO TB24: 15.0000 mg | ORAL_TABLET | Freq: Every day | ORAL | 11 refills | Status: DC

## 2021-10-30 ENCOUNTER — Ambulatory Visit: Payer: 59 | Admitting: Podiatry

## 2021-10-30 ENCOUNTER — Other Ambulatory Visit
Admission: RE | Admit: 2021-10-30 | Discharge: 2021-10-30 | Disposition: A | Discharge status: Home / Self Care | Payer: 59 | Source: Ambulatory Visit | Attending: Pulmonary Disease | Admitting: Pulmonary Disease

## 2021-10-30 ENCOUNTER — Other Ambulatory Visit: Payer: 59

## 2021-10-30 DIAGNOSIS — Z20822 Contact with and (suspected) exposure to covid-19: Secondary | ICD-10-CM | POA: Diagnosis not present

## 2021-10-30 DIAGNOSIS — Z01812 Encounter for preprocedural laboratory examination: Secondary | ICD-10-CM | POA: Diagnosis not present

## 2021-10-30 LAB — APTT: aPTT: 28 seconds (ref 24–36)

## 2021-10-30 LAB — PROTIME-INR
INR: 1.3 — ABNORMAL HIGH (ref 0.8–1.2)
Prothrombin Time: 15.9 seconds — ABNORMAL HIGH (ref 11.4–15.2)

## 2021-10-31 ENCOUNTER — Ambulatory Visit: Payer: 59 | Admitting: Dermatology

## 2021-10-31 ENCOUNTER — Ambulatory Visit: Payer: 59

## 2021-10-31 LAB — SARS CORONAVIRUS 2 (TAT 6-24 HRS): SARS Coronavirus 2: NEGATIVE

## 2021-10-31 MED ORDER — CHLORHEXIDINE GLUCONATE 0.12 % MT SOLN: 15.0000 mL | Freq: Once | OROMUCOSAL | Status: AC

## 2021-10-31 MED ORDER — ORAL CARE MOUTH RINSE: 15.0000 mL | Freq: Once | OROMUCOSAL | Status: AC

## 2021-10-31 MED ORDER — FAMOTIDINE 20 MG PO TABS: 20.0000 mg | ORAL_TABLET | Freq: Once | ORAL | Status: DC

## 2021-10-31 MED ORDER — FAMOTIDINE 20 MG PO TABS: 20.0000 mg | ORAL_TABLET | Freq: Once | ORAL | Status: AC

## 2021-10-31 MED ORDER — LACTATED RINGERS IV SOLN: INTRAVENOUS | Status: DC

## 2021-11-01 ENCOUNTER — Ambulatory Visit: Payer: 59 | Admitting: Anesthesiology

## 2021-11-01 ENCOUNTER — Ambulatory Visit
Admission: RE | Admit: 2021-11-01 | Discharge: 2021-11-01 | Disposition: A | Discharge status: Home / Self Care | Payer: 59 | Source: Ambulatory Visit | Attending: Pulmonary Disease | Admitting: Pulmonary Disease

## 2021-11-01 ENCOUNTER — Encounter: Admission: RE | Payer: Self-pay | Source: Ambulatory Visit

## 2021-11-01 ENCOUNTER — Other Ambulatory Visit: Payer: Self-pay

## 2021-11-01 ENCOUNTER — Encounter
Admission: RE | Disposition: A | Discharge status: Home / Self Care | Payer: Self-pay | Source: Ambulatory Visit | Attending: Pulmonary Disease

## 2021-11-01 DIAGNOSIS — T17590A Other foreign object in bronchus causing asphyxiation, initial encounter: Secondary | ICD-10-CM | POA: Diagnosis not present

## 2021-11-01 DIAGNOSIS — J449 Chronic obstructive pulmonary disease, unspecified: Secondary | ICD-10-CM | POA: Insufficient documentation

## 2021-11-01 DIAGNOSIS — M3214 Glomerular disease in systemic lupus erythematosus: Secondary | ICD-10-CM | POA: Diagnosis not present

## 2021-11-01 DIAGNOSIS — F32A Depression, unspecified: Secondary | ICD-10-CM | POA: Diagnosis not present

## 2021-11-01 DIAGNOSIS — E785 Hyperlipidemia, unspecified: Secondary | ICD-10-CM | POA: Diagnosis not present

## 2021-11-01 DIAGNOSIS — Z8701 Personal history of pneumonia (recurrent): Secondary | ICD-10-CM | POA: Diagnosis not present

## 2021-11-01 DIAGNOSIS — X58XXXA Exposure to other specified factors, initial encounter: Secondary | ICD-10-CM | POA: Insufficient documentation

## 2021-11-01 DIAGNOSIS — I890 Lymphedema, not elsewhere classified: Secondary | ICD-10-CM | POA: Diagnosis not present

## 2021-11-01 DIAGNOSIS — Z86718 Personal history of other venous thrombosis and embolism: Secondary | ICD-10-CM | POA: Diagnosis not present

## 2021-11-01 DIAGNOSIS — F1190 Opioid use, unspecified, uncomplicated: Secondary | ICD-10-CM | POA: Diagnosis not present

## 2021-11-01 DIAGNOSIS — Z86711 Personal history of pulmonary embolism: Secondary | ICD-10-CM | POA: Diagnosis not present

## 2021-11-01 DIAGNOSIS — N1831 Chronic kidney disease, stage 3a: Secondary | ICD-10-CM | POA: Diagnosis not present

## 2021-11-01 DIAGNOSIS — Z8616 Personal history of COVID-19: Secondary | ICD-10-CM | POA: Insufficient documentation

## 2021-11-01 DIAGNOSIS — M549 Dorsalgia, unspecified: Secondary | ICD-10-CM | POA: Diagnosis not present

## 2021-11-01 DIAGNOSIS — M810 Age-related osteoporosis without current pathological fracture: Secondary | ICD-10-CM | POA: Diagnosis not present

## 2021-11-01 DIAGNOSIS — M329 Systemic lupus erythematosus, unspecified: Secondary | ICD-10-CM | POA: Insufficient documentation

## 2021-11-01 DIAGNOSIS — M1990 Unspecified osteoarthritis, unspecified site: Secondary | ICD-10-CM | POA: Insufficient documentation

## 2021-11-01 DIAGNOSIS — Z981 Arthrodesis status: Secondary | ICD-10-CM | POA: Diagnosis not present

## 2021-11-01 DIAGNOSIS — T17500A Unspecified foreign body in bronchus causing asphyxiation, initial encounter: Secondary | ICD-10-CM | POA: Diagnosis present

## 2021-11-01 DIAGNOSIS — K219 Gastro-esophageal reflux disease without esophagitis: Secondary | ICD-10-CM | POA: Insufficient documentation

## 2021-11-01 DIAGNOSIS — G8929 Other chronic pain: Secondary | ICD-10-CM | POA: Insufficient documentation

## 2021-11-01 DIAGNOSIS — I129 Hypertensive chronic kidney disease with stage 1 through stage 4 chronic kidney disease, or unspecified chronic kidney disease: Secondary | ICD-10-CM | POA: Diagnosis not present

## 2021-11-01 HISTORY — PX: FLEXIBLE BRONCHOSCOPY: SHX5094

## 2021-11-01 HISTORY — PX: BRONCHIAL WASHINGS: SHX5105

## 2021-11-01 LAB — BODY FLUID CELL COUNT WITH DIFFERENTIAL: Total Nucleated Cell Count, Fluid: 4 cu mm (ref 0–1000)

## 2021-11-01 SURGERY — IRRIGATION, BRONCHUS
Anesthesia: General

## 2021-11-01 SURGERY — BRONCHOSCOPY, FLEXIBLE
Anesthesia: General

## 2021-11-01 MED ORDER — PROMETHAZINE HCL 25 MG/ML IJ SOLN: 6.2500 mg | INTRAMUSCULAR | Status: DC | PRN

## 2021-11-01 MED ORDER — MIDAZOLAM HCL 2 MG/2ML IJ SOLN
INTRAMUSCULAR | Status: DC | PRN
  Administered 2021-11-01: 2 mg via INTRAVENOUS

## 2021-11-01 MED ORDER — PROPOFOL 10 MG/ML IV BOLUS
INTRAVENOUS | Status: DC | PRN
  Administered 2021-11-01: 200 mg via INTRAVENOUS

## 2021-11-01 MED ORDER — DEXAMETHASONE SODIUM PHOSPHATE 10 MG/ML IJ SOLN
INTRAMUSCULAR | Status: DC | PRN
  Administered 2021-11-01: 10 mg via INTRAVENOUS

## 2021-11-01 MED ORDER — FENTANYL CITRATE (PF) 100 MCG/2ML IJ SOLN
INTRAMUSCULAR | Status: AC
  Filled 2021-11-01: qty 2

## 2021-11-01 MED ORDER — PROPOFOL 500 MG/50ML IV EMUL
INTRAVENOUS | Status: DC | PRN
  Administered 2021-11-01 (×2): 125 ug/kg/min via INTRAVENOUS

## 2021-11-01 MED ORDER — LIDOCAINE HCL (CARDIAC) PF 100 MG/5ML IV SOSY
PREFILLED_SYRINGE | INTRAVENOUS | Status: DC | PRN
  Administered 2021-11-01: 50 mg via INTRAVENOUS

## 2021-11-01 MED ORDER — FAMOTIDINE 20 MG PO TABS
ORAL_TABLET | ORAL | Status: AC
  Administered 2021-11-01: 20 mg via ORAL
  Filled 2021-11-01: qty 1

## 2021-11-01 MED ORDER — MIDAZOLAM HCL 2 MG/2ML IJ SOLN
INTRAMUSCULAR | Status: AC
  Filled 2021-11-01: qty 2

## 2021-11-01 MED ORDER — BUTAMBEN-TETRACAINE-BENZOCAINE 2-2-14 % EX AERO
1.0000 | INHALATION_SPRAY | Freq: Once | CUTANEOUS | Status: DC
  Filled 2021-11-01: qty 20

## 2021-11-01 MED ORDER — LIDOCAINE HCL (PF) 2 % IJ SOLN
INTRAMUSCULAR | Status: AC
  Filled 2021-11-01: qty 5

## 2021-11-01 MED ORDER — FENTANYL CITRATE (PF) 100 MCG/2ML IJ SOLN
INTRAMUSCULAR | Status: DC | PRN
  Administered 2021-11-01 (×2): 50 ug via INTRAVENOUS

## 2021-11-01 MED ORDER — DEXAMETHASONE SODIUM PHOSPHATE 10 MG/ML IJ SOLN
INTRAMUSCULAR | Status: AC
  Filled 2021-11-01: qty 1

## 2021-11-01 MED ORDER — GLYCOPYRROLATE 0.2 MG/ML IJ SOLN
INTRAMUSCULAR | Status: AC
  Filled 2021-11-01: qty 1

## 2021-11-01 MED ORDER — SUCCINYLCHOLINE CHLORIDE 200 MG/10ML IV SOSY
PREFILLED_SYRINGE | INTRAVENOUS | Status: DC | PRN
  Administered 2021-11-01: 100 mg via INTRAVENOUS

## 2021-11-01 MED ORDER — SUCCINYLCHOLINE CHLORIDE 200 MG/10ML IV SOSY
PREFILLED_SYRINGE | INTRAVENOUS | Status: AC
  Filled 2021-11-01: qty 10

## 2021-11-01 MED ORDER — LIDOCAINE HCL (PF) 1 % IJ SOLN
30.0000 mL | Freq: Once | INTRAMUSCULAR | Status: DC
  Filled 2021-11-01: qty 30

## 2021-11-01 MED ORDER — PROPOFOL 500 MG/50ML IV EMUL
INTRAVENOUS | Status: AC
  Filled 2021-11-01: qty 50

## 2021-11-01 MED ORDER — ONDANSETRON HCL 4 MG/2ML IJ SOLN
INTRAMUSCULAR | Status: DC | PRN
  Administered 2021-11-01: 4 mg via INTRAVENOUS

## 2021-11-01 MED ORDER — ONDANSETRON HCL 4 MG/2ML IJ SOLN
INTRAMUSCULAR | Status: AC
  Filled 2021-11-01: qty 2

## 2021-11-01 MED ORDER — GLYCOPYRROLATE 0.2 MG/ML IJ SOLN
INTRAMUSCULAR | Status: DC | PRN
  Administered 2021-11-01: .2 mg via INTRAVENOUS

## 2021-11-01 MED ORDER — LIDOCAINE HCL URETHRAL/MUCOSAL 2 % EX GEL
1.0000 "application " | Freq: Once | CUTANEOUS | Status: DC
  Filled 2021-11-01: qty 5

## 2021-11-01 MED ORDER — CHLORHEXIDINE GLUCONATE 0.12 % MT SOLN
OROMUCOSAL | Status: AC
  Administered 2021-11-01: 15 mL via OROMUCOSAL
  Filled 2021-11-01: qty 15

## 2021-11-01 MED ORDER — PHENYLEPHRINE HCL 0.25 % NA SOLN
1.0000 | Freq: Four times a day (QID) | NASAL | Status: DC | PRN
  Filled 2021-11-01: qty 15

## 2021-11-01 NOTE — Anesthesia Preprocedure Evaluation (Addendum)
Anesthesia Evaluation  Patient identified by MRN, date of birth, ID band Patient awake    Reviewed: Allergy & Precautions, H&P , NPO status , Patient's Chart, lab work & pertinent test results  Airway Mallampati: III  TM Distance: >3 FB Neck ROM: full    Dental  (+) Caps   Pulmonary shortness of breath and with exertion, COPD,  COPD inhaler,  H/o PE   Pulmonary exam normal        Cardiovascular Exercise Tolerance: Poor METS: < 3 Mets hypertension, On Medications and Pt. on medications + DOE  Normal cardiovascular exam Rhythm:regular Rate:Normal  H/o DVT, s/p IVC filter with removal. On anticoagulation, held for procedure  Lymphedema  Cardiology visit 09/20/21  Myocardial Perfusion Scan 09/25/2021: .  The study is low risk. .  There is no evidence for ischemia .  Left ventricular function is normal. LVEF 69% .  There are coronary artery calcifications in the LAD and LCX arteries.  ECHO 09/10/21: 1. Left ventricular ejection fraction, by estimation, is 55 to 60%. The  left ventricle has normal function. The left ventricle has no regional  wall motion abnormalities. Left ventricular diastolic parameters are  consistent with Grade II diastolic  dysfunction (pseudonormalization).  2. Right ventricular systolic function is normal. The right ventricular  size is normal.  3. The mitral valve is normal in structure. No evidence of mitral valve  regurgitation.  4. The aortic valve is normal in structure. Aortic valve regurgitation is  trivial.    Neuro/Psych PSYCHIATRIC DISORDERS Depression Chronic opioid use for chronic back pain Neuromuscular disease ( lumbar spondylosis s/p lumbar fusion. Pt with chronic back pain)    GI/Hepatic Neg liver ROS, GERD  Medicated and Controlled,  Endo/Other  Long term current use of systemic steroids  Renal/GU CRFRenal disease (Stage 3a, SLE glomerulonephritis syndrome)  negative  genitourinary   Musculoskeletal  (+) Arthritis ,   Abdominal Normal abdominal exam  (+)   Peds  Hematology negative hematology ROS (+)   Anesthesia Other Findings Past Medical History: No date: Depression No date: DVT (deep venous thrombosis) (HCC) No date: GERD (gastroesophageal reflux disease) No date: Hyperlipidemia No date: Hypertension No date: Lupus ([place]) No date: Osteoporosis No date: Pulmonary embolism (HCC) No date: Renal disorder     Comment:  Stage III Past Surgical History: No date: ANKLE SURGERY; Right 92 or 93 : CYST EXCISION     Comment:  Liver cyst removal [facility] 04/29/2017: I & D EXTREMITY; Right     Comment:  Procedure: IRRIGATION AND DEBRIDEMENT EXTREMITY;                Surgeon: Clayburn Pert, MD;  Location: [facility] ORS;                Service: General;  Laterality: Right; 04/29/2017: IRRIGATION AND DEBRIDEMENT ABSCESS; Left     Comment:  Procedure: IRRIGATION AND DEBRIDEMENT Scrotal ABSCESS;                Surgeon: Clayburn Pert, MD;  Location: [facility] ORS;                Service: General;  Laterality: Left; BMI    Body Mass Index: 31.87 kg/m     Reproductive/Obstetrics negative OB ROS                          Anesthesia Physical  Anesthesia Plan  ASA: III  Anesthesia Plan: General   Post-op Pain Management:  Induction: Intravenous  PONV Risk Score and Plan: 2 and Ondansetron, Midazolam and Dexamethasone  Airway Management Planned: Oral ETT  Additional Equipment:   Intra-op Plan:   Post-operative Plan: Extubation in OR  Informed Consent: I have reviewed the patients History and Physical, chart, labs and discussed the procedure including the risks, benefits and alternatives for the proposed anesthesia with the patient or authorized representative who has indicated his/her understanding and acceptance.     Dental advisory given  Plan Discussed with: CRNA  Anesthesia Plan Comments:        Anesthesia Quick  Evaluation

## 2021-11-01 NOTE — H&P (Signed)
Pulmonary Medicine          Date: 11/01/2021,   MRN# [id number] Jonathon Snow [birth year]     AdmissionWeight: 107 kg                 CurrentWeight: 107 kg      CHIEF COMPLAINT:   Recurrent pneumonia and dyspnea with cough   HISTORY OF PRESENT ILLNESS   This is a very pleasant 60 yo M with hx of COVID19 infection, GERD, DVT, dyslipidemia, PONV, renal disease, SLE on crhonic steroids and Cell cept therapy who has had chronic indolent respiratory sympoms including dyspnea , cough and recurrent pneumonia with hospitalization with severe acute hypoxemic respiratory failure.  He was treated with antimicrobials and transiently improves with subsequent wornsening.  Due to relative immunosuppresion on high dose Mycophenolate it is thought he may have opportunistic infection such as fungal, bacterial of PJP.  Today paitent presents for bronchoscopic airway inspection and BAL for microbiology.    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   Overview:  superior sagittal sinus, left transverse sinus and cortical veins    COVID-19 virus infection 12/2020   Depression    DVT (deep venous thrombosis) (HCC)    Dyspnea    GERD (gastroesophageal reflux disease)    Heel spur, left 02/16/2019   Heel spur, right 02/16/2019   Hyperlipidemia    Hypertension    Lupus ([place])    Lymphedema 10/07/2018   Morbid obesity ([place])    Opiate abuse, episodic ([place]) 02/26/2018   Osteoporosis    Pneumonia    PONV (postoperative nausea and vomiting)    Postphlebitic syndrome with ulcer, 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   Presence of IVC filter 03/22/2020   Removed   Pulmonary embolism ([place])    Renal disorder    Stage III     SURGICAL HISTORY   Past Surgical History:  Procedure Laterality Date   ANKLE SURGERY Right    COLONOSCOPY WITH PROPOFOL N/A 05/28/2020   Procedure: COLONOSCOPY WITH PROPOFOL;   Surgeon: Jonathon Bellows, MD;  Location: [facility] ENDOSCOPY;  Service: Endoscopy;  Laterality: N/A;   CYST EXCISION  92 or 93    Liver cyst removal [facility]   I & D EXTREMITY Right 04/29/2017   Procedure: IRRIGATION AND DEBRIDEMENT EXTREMITY;  Surgeon: Clayburn Pert, MD;  Location: [facility] ORS;  Service: General;  Laterality: Right;   IRRIGATION AND DEBRIDEMENT ABSCESS Left 04/29/2017   Procedure: IRRIGATION AND DEBRIDEMENT Scrotal ABSCESS;  Surgeon: Clayburn Pert, MD;  Location: [facility] ORS;  Service: General;  Laterality: Left;     FAMILY HISTORY   Family History  Problem Relation Age of Onset   Hypertension Father    Heart disease Father    Clotting disorder Mother    Kidney disease Brother    Heart attack Maternal Grandmother    Heart attack Maternal Grandfather    Heart attack Paternal Grandfather      SOCIAL HISTORY   Social History   Tobacco Use   Smoking status: Never   Smokeless tobacco: Never  Vaping Use   Vaping Use: Never used  Substance Use Topics   Alcohol use: No   Drug use: No    Comment: PT DENIES     MEDICATIONS    Home Medication:    Current Medication:  Current Facility-Administered Medications:    lactated ringers infusion, , Intravenous, Continuous, Martha Clan,  MD, Last Rate: 10 mL/hr at 11/01/21 1046, New Bag at 11/01/21 1046    ALLERGIES   Enalapril and Vicodin [hydrocodone-acetaminophen]     REVIEW OF SYSTEMS    Review of Systems:  Gen:  Denies  fever, sweats, chills weigh loss  HEENT: Denies blurred vision, double vision, ear pain, eye pain, hearing loss, nose bleeds, sore throat Cardiac:  No dizziness, chest pain or heaviness, chest tightness,edema Resp:   Denies cough or sputum porduction, shortness of breath,wheezing, hemoptysis,  Gi: Denies swallowing difficulty, stomach pain, nausea or vomiting, diarrhea, constipation, bowel incontinence Gu:  Denies bladder incontinence, burning urine Ext:   Denies Joint pain, stiffness or  swelling Skin: Denies  skin rash, easy bruising or bleeding or hives Endoc:  Denies polyuria, polydipsia , polyphagia or weight change Psych:   Denies depression, insomnia or hallucinations   Other:  All other systems negative   VS: BP (!) 167/93   Pulse 94   Temp 97.8 F (36.6 C) (Oral)   Resp 16   Ht _0  (1.905 m)   Wt 107 kg   SpO2 100%   BMI 29.48 kg/m      PHYSICAL EXAM    GENERAL:NAD, no fevers, chills, no weakness no fatigue HEAD: Normocephalic, atraumatic.  EYES: Pupils equal, round, reactive to light. Extraocular muscles intact. No scleral icterus.  MOUTH: Moist mucosal membrane. Dentition intact. No abscess noted.  EAR, NOSE, THROAT: Clear without exudates. No external lesions.  NECK: Supple. No thyromegaly. No nodules. No JVD.  PULMONARY: Diffuse coarse rhonchi right sided +wheezes CARDIOVASCULAR: S1 and S2. Regular rate and rhythm. No murmurs, rubs, or gallops. No edema. Pedal pulses 2+ bilaterally.  GASTROINTESTINAL: Soft, nontender, nondistended. No masses. Positive bowel sounds. No hepatosplenomegaly.  MUSCULOSKELETAL: No swelling, clubbing, or edema. Range of motion full in all extremities.  NEUROLOGIC: Cranial nerves II through XII are intact. No gross focal neurological deficits. Sensation intact. Reflexes intact.  SKIN: No ulceration, lesions, rashes, or cyanosis. Skin warm and dry. Turgor intact.  PSYCHIATRIC: Mood, affect within normal limits. The patient is awake, alert and oriented x 3. Insight, judgment intact.       IMAGING    No results found.    ASSESSMENT/PLAN   Recurrent hypoxemia and pneumonia - patient on high dose mycophenolate due to Systemic Lupus with resultant immunosuppresion -here today for bronchoscopic airway inspection, therapeutic aspiration of mucus plugging of tracheobronchial tree and bronchoalveolar lavage for microbiology and cytology workup.   -patient was seen and examined prior to procedure and details of  procedure reviewed with questions answered -patient wishes to proceed with scheduled surgery -Reviewed risks/complications and benefits with patient, risks include infection, pneumothorax/pneumomediastinum which may require chest tube placement as well as overnight/prolonged hospitalization and possible mechanical ventilation. Other risks include bleeding and very rarely death.  Patient understands risks and wishes to proceed.  Additional questions were answered, and patient is aware that post procedure patient will be going home with family and may experience cough with possible clots on expectoration as well as phlegm which may last few days as well as hoarseness of voice post intubation and mechanical ventilation.  Thank you for allowing me to participate in the care of this patient.  Total face to face encounter time for this patient visit was >67mn. >50% of the time was  spent in counseling and coordination of care.   Patient/Family are satisfied with care plan and all questions have been answered.  This document was prepared using DSet designer  software and may include unintentional dictation errors.     [name], M.D.  Division of [place]

## 2021-11-01 NOTE — Procedures (Signed)
PROCEDURE: BRONCHOSCOPY Therapeutic Aspiration of Tracheobronchial Tree  PROCEDURE DATE: 11/01/2021  TIME:  NAME:  Jonathon Snow  DOB:02/14/1961  MRN: [id number] LOC:  [facility]/None    HOSP DAY: _0 @ CODE STATUS:   Code Status History     Date Active Date Inactive Code Status Order ID Comments User Context   09/10/2021 0400 09/11/2021 1936 Full Code [id number]  Athena Masse, MD ED   03/17/2018 2251 03/19/2018 1802 Full Code [id number]  Amelia Jo, MD Inpatient   02/21/2018 0520 03/03/2018 2136 Full Code [id number]  Saundra Shelling, MD Inpatient   05/07/2017 1524 05/09/2017 1711 Full Code [id number]  Epifanio Lesches, MD ED   04/27/2017 1847 05/03/2017 2012 Full Code [id number]  Vaughan Basta, MD Inpatient      Questions for Most Recent Historical Code Status (Order [id number])            Indications/Preliminary Diagnosis:   Consent: (Place X beside choice/s below)  The benefits, risks and possible complications of the procedure were        explained to:  _s__ patient  ___ patient's family  ___ other:___________  who verbalized understanding and gave:  ___ verbal  ___ written  ___ verbal and written  ___ telephone  ___ other:________ consent.      Unable to obtain consent; procedure performed on emergent basis.     Other:       PRESEDATION ASSESSMENT: History and Physical has been performed. Patient meds and allergies have been reviewed. Presedation airway examination has been performed and documented. Baseline vital signs, sedation score, oxygenation status, and cardiac rhythm were reviewed. Patient was deemed to be in satisfactory condition to undergo the procedure.        PROCEDURE DETAILS: Timeout performed and correct patient, name, & ID confirmed. Following prep per Pulmonary policy, appropriate sedation was administered. The Bronchoscope was inserted in to oral cavity with bite block in place. Therapeutic aspiration of Tracheobronchial tree was  performed.  Airway exam proceeded with findings, technical procedures, and specimen collection as noted below. At the end of exam the scope was withdrawn without incident. Impression and Plan as noted below.           Airway Prep (Place X beside choice below)   1% Transtracheal Lidocaine Anesthetization 7 cc   Patient prepped per Bronchoscopy Lab Policy       Insertion Route (Place X beside choice below)   Nasal   Oral  x Endotracheal Tube   Tracheostomy   INTRAPROCEDURE MEDICATIONS:  Medication Amt Dose  Medication Amt Dose  Lidocaine 1%  cc  Epinephrine 1:10,000 sol  cc  Xylocaine 4%  cc  Cocaine  cc   TECHNICAL PROCEDURES: (Place X beside choice below)   Procedures  Description    None     Electrocautery     Cryotherapy     Balloon Dilatation     Bronchography     Stent Placement   x  Therapeutic Aspiration Left upper lobe    Laser/Argon Plasma    Brachytherapy Catheter Placement    Foreign Body Removal         SPECIMENS (Sites): (Place X beside choice below)  Specimens Description   No Specimens Obtained     [place]   x Lavage RML and Lingular segments   Biopsies    Fine Needle Aspirates    Brushings    Sputum    FINDINGS:  ESTIMATED BLOOD LOSS: none COMPLICATIONS/RESOLUTION: none   BAL was done 3 times and  sent for microbiology and cytology  Aspiration of mucoid material was performed at Left upper lobe   IMPRESSION:POST-PROCEDURE DX:     RECOMMENDATION/PLAN:    Await cytology and microbiology results.    [name], M.D.  Pulmonary & [place]

## 2021-11-01 NOTE — Discharge Instructions (Signed)
AMBULATORY SURGERY  ?DISCHARGE INSTRUCTIONS ? ? ?The drugs that you were given will stay in your system until tomorrow so for the next 24 hours you should not: ? ?Drive an automobile ?Make any legal decisions ?Drink any alcoholic beverage ? ? ?You may resume regular meals tomorrow.  Today it is better to start with liquids and gradually work up to solid foods. ? ?You may eat anything you prefer, but it is better to start with liquids, then soup and crackers, and gradually work up to solid foods. ? ? ?Please notify your doctor immediately if you have any unusual bleeding, trouble breathing, redness and pain at the surgery site, drainage, fever, or pain not relieved by medication. ? ? ? ?Additional Instructions: ? ? ? ?Please contact your physician with any problems or Same Day Surgery at [phone], Monday through Friday 6 am to 4 pm, or [place] [facility] Main number at [phone].  ?

## 2021-11-01 NOTE — Anesthesia Procedure Notes (Signed)
Procedure Name: Intubation Date/Time: 11/01/2021 12:15 PM Performed by: Rolla Plate, CRNA Pre-anesthesia Checklist: Patient identified, Patient being monitored, Timeout performed, Emergency Drugs available and Suction available Patient Re-evaluated:Patient Re-evaluated prior to induction Oxygen Delivery Method: Circle system utilized Preoxygenation: Pre-oxygenation with 100% oxygen Induction Type: IV induction Ventilation: Mask ventilation without difficulty Laryngoscope Size: McGraph and 4 Grade View: Grade I Tube type: Oral Tube size: 8.5 mm Number of attempts: 1 Airway Equipment and Method: Stylet and Video-laryngoscopy Placement Confirmation: ETT inserted through vocal cords under direct vision, positive ETCO2 and breath sounds checked- equal and bilateral Secured at: 23 cm Tube secured with: Tape Dental Injury: Teeth and Oropharynx as per pre-operative assessment

## 2021-11-01 NOTE — Transfer of Care (Signed)
Immediate Anesthesia Transfer of Care Note  Patient: Jonathon Snow  Procedure(s) Performed: FLEXIBLE BRONCHOSCOPY BRONCHIAL WASHINGS  Patient Location: PACU  Anesthesia Type:General  Level of Consciousness: awake  Airway & Oxygen Therapy: Patient Spontanous Breathing and Patient connected to face mask oxygen  Post-op Assessment: Report given to RN and Post -op Vital signs reviewed and stable  Post vital signs: Reviewed  Last Vitals:  Vitals Value Taken Time  BP    Temp    Pulse 87 11/01/21 1235  Resp 21 11/01/21 1235  SpO2 98 % 11/01/21 1235  Vitals shown include unvalidated device data.  Last Pain:  Vitals:   11/01/21 1039  TempSrc: Oral  PainSc: 0-No pain         Complications: No notable events documented.

## 2021-11-03 ENCOUNTER — Encounter: Payer: Self-pay | Admitting: Pulmonary Disease

## 2021-11-05 ENCOUNTER — Ambulatory Visit: Payer: 59 | Attending: Internal Medicine

## 2021-11-05 ENCOUNTER — Other Ambulatory Visit: Payer: Self-pay

## 2021-11-05 DIAGNOSIS — Z23 Encounter for immunization: Secondary | ICD-10-CM

## 2021-11-05 LAB — CULTURE, BAL-QUANTITATIVE W GRAM STAIN
Culture: NO GROWTH
Gram Stain: NONE SEEN

## 2021-11-05 MED ORDER — PFIZER COVID-19 VAC BIVALENT 30 MCG/0.3ML IM SUSP
INTRAMUSCULAR | 0 refills | Status: DC
  Filled 2021-11-05: qty 0.3, 1d supply, fill #0

## 2021-11-05 NOTE — Progress Notes (Signed)
   Covid-19 Vaccination Clinic  Name:  JAYON MATTON    MRN: [id number] DOB: 1961-10-16  11/05/2021  Mr. Mo was observed post Covid-19 immunization for 15 minutes without incident. He was provided with Vaccine Information Sheet and instruction to access the V-Safe system.   Mr. Heffner was instructed to call 911 with any severe reactions post vaccine: Difficulty breathing  Swelling of face and throat  A fast heartbeat  A bad rash all over body  Dizziness and weakness   Immunizations Administered     Name Date Dose VIS Date Route   Pfizer Covid-19 Vaccine Bivalent Booster 11/05/2021  9:13 AM 0.3 mL 08/21/2021 Intramuscular   Manufacturer: [place]   Lot: [id number]   [place]: [place], PharmD, MBA Clinical Acute Care Pharmacist

## 2021-11-06 LAB — ASPERGILLUS ANTIGEN, BAL/SERUM: Aspergillus Ag, BAL/Serum: 0.08 Index (ref 0.00–0.49)

## 2021-11-07 ENCOUNTER — Ambulatory Visit: Payer: 59

## 2021-11-07 LAB — ACID FAST SMEAR (AFB, MYCOBACTERIA): Acid Fast Smear: NEGATIVE

## 2021-11-09 LAB — BORDETELLA PERTUSSIS PCR
B parapertussis, DNA: NEGATIVE
B pertussis, DNA: NEGATIVE

## 2021-11-11 ENCOUNTER — Ambulatory Visit: Payer: 59 | Admitting: Podiatry

## 2021-11-11 ENCOUNTER — Other Ambulatory Visit: Payer: Self-pay

## 2021-11-11 DIAGNOSIS — Q828 Other specified congenital malformations of skin: Secondary | ICD-10-CM

## 2021-11-11 LAB — VIRUS CULTURE

## 2021-11-11 NOTE — Anesthesia Postprocedure Evaluation (Signed)
Anesthesia Post Note  Patient: Jonathon Snow  Procedure(s) Performed: [place]  Patient location during evaluation: PACU Anesthesia Type: General Level of consciousness: awake and awake and alert Pain management: pain level controlled Vital Signs Assessment: post-procedure vital signs reviewed and stable Respiratory status: spontaneous breathing and respiratory function stable Cardiovascular status: blood pressure returned to baseline Anesthetic complications: no   No notable events documented.   Last Vitals:  Vitals:   11/01/21 1245 11/01/21 1314  BP: 116/67 (!) 160/90  Pulse: 89 94  Resp: 19 16  Temp: (!) 36.2 C (!) 36.2 C  SpO2: 96% 94%    Last Pain:  Vitals:   11/02/21 1813  TempSrc:   PainSc: 0-No pain                 VAN STAVEREN,Alleene Stoy

## 2021-11-11 NOTE — Progress Notes (Signed)
He presents today chief concern of a painful lesion subfirst metatarsophalangeal joint of the right foot.  He was referred here by Dr. Nehemiah Massed.  States that this has been present for quite some time.  He has recently had back surgery by Dr. Rennis Harding and states that his back is still painful and is unable to work on his feet.   Objective: Vital signs are stable alert and oriented x3 pulses are palpable.  Neurologic Sorum is intact Deetjen reflex are intact muscle strength normal symmetrical bilateral.  Orthopedic evaluation straits all joints distal ankle full range of motion without crepitation.  Mild hammertoe deformities are noted bilateral.  No tenderness on palpation medial calcaneal tubercles bilateral.  He does have a reactive hyperkeratotic lesion benign skin lesions of tibial sesamoid hallux right.  Assessment benign skin lesions tibial sesamoid hallux right.  SLE  Plan: Mechanical followed by chemical debridement of the lesion today.  I debrided the lesion for him applied Cantharone under occlusion to be washed off thoroughly tomorrow and I will follow-up with him in about 1 month.

## 2021-11-18 ENCOUNTER — Ambulatory Visit: Payer: 59

## 2021-11-18 ENCOUNTER — Ambulatory Visit: Payer: 59 | Admitting: Family Medicine

## 2021-11-19 ENCOUNTER — Ambulatory Visit: Payer: 59 | Admitting: Family Medicine

## 2021-11-22 LAB — CULTURE, FUNGUS WITHOUT SMEAR

## 2021-11-25 ENCOUNTER — Ambulatory Visit: Payer: 59 | Admitting: Nurse Practitioner

## 2021-11-26 ENCOUNTER — Ambulatory Visit: Payer: 59

## 2021-12-02 ENCOUNTER — Ambulatory Visit: Payer: 59 | Admitting: Nurse Practitioner

## 2021-12-04 ENCOUNTER — Encounter: Payer: 59 | Attending: Pulmonary Disease

## 2021-12-04 ENCOUNTER — Other Ambulatory Visit: Payer: Self-pay

## 2021-12-04 VITALS — Ht 74.5 in | Wt 238.2 lb

## 2021-12-04 DIAGNOSIS — J45909 Unspecified asthma, uncomplicated: Secondary | ICD-10-CM | POA: Diagnosis present

## 2021-12-04 NOTE — Patient Instructions (Signed)
Patient Instructions  Patient Details  Name: Jonathon Snow MRN: [id number] Date of Birth: Sep 30, 1961 Referring Provider:  [name], MD  Below are your personal goals for exercise, nutrition, and risk factors. Our goal is to help you stay on track towards obtaining and maintaining these goals. We will be discussing your progress on these goals with you throughout the program.  Initial Exercise Prescription:  Initial Exercise Prescription - 12/04/21 1200       Date of Initial Exercise RX and Referring Provider   Date 12/04/21    Referring Provider [name] MD      Oxygen   Maintain Oxygen Saturation 88% or higher      Treadmill   MPH 1.8    Grade 0    Minutes 15    METs 2.38      Recumbant Bike   Level 1    RPM 60    Watts 30    Minutes 15    METs 3.3      NuStep   Level 1    SPM 80    Minutes 15    METs 3.3      Prescription Details   Frequency (times per week) 2    Duration Progress to 30 minutes of continuous aerobic without signs/symptoms of physical distress      Intensity   THRR 40-80% of Max Heartrate 120-147    Ratings of Perceived Exertion 11-13    Perceived Dyspnea 0-4      Progression   Progression Continue to progress workloads to maintain intensity without signs/symptoms of physical distress.      Resistance Training   Training Prescription Yes    Weight 3 lb    Reps 10-15             Exercise Goals: Frequency: Be able to perform aerobic exercise two to three times per week in program working toward 2-5 days per week of home exercise.  Intensity: Work with a perceived exertion of 11 (fairly light) - 15 (hard) while following your exercise prescription.  We will make changes to your prescription with you as you progress through the program.   Duration: Be able to do 30 to 45 minutes of continuous aerobic exercise in addition to a 5 minute warm-up and a 5 minute cool-down routine.   Nutrition Goals: Your personal nutrition  goals will be established when you do your nutrition analysis with the dietician.  The following are general nutrition guidelines to follow: Cholesterol < 200mg /day Sodium < 1500mg /day Fiber: Men over 50 yrs - 30 grams per day  Personal Goals:  Personal Goals and Risk Factors at Admission - 12/04/21 1255       Core Components/Risk Factors/Patient Goals on Admission    Weight Management Yes;Weight Loss    Intervention Weight Management: Develop a combined nutrition and exercise program designed to reach desired caloric intake, while maintaining appropriate intake of nutrient and fiber, sodium and fats, and appropriate energy expenditure required for the weight goal.;Weight Management: Provide education and appropriate resources to help participant work on and attain dietary goals.;Weight Management/Obesity: Establish reasonable short term and long term weight goals.;Obesity: Provide education and appropriate resources to help participant work on and attain dietary goals.    Admit Weight 238 lb (108 kg)    Goal Weight: Short Term 233 lb (105.7 kg)    Goal Weight: Long Term 215 lb (97.5 kg)    Expected Outcomes Short Term: Continue to assess and modify interventions until  short term weight is achieved;Long Term: Adherence to nutrition and physical activity/exercise program aimed toward attainment of established weight goal;Weight Loss: Understanding of general recommendations for a balanced deficit meal plan, which promotes 1-2 lb weight loss per week and includes a negative energy balance of [id number] kcal/d;Understanding recommendations for meals to include 15-35% energy as protein, 25-35% energy from fat, 35-60% energy from carbohydrates, less than 200mg  of dietary cholesterol, 20-35 gm of total fiber daily;Understanding of distribution of calorie intake throughout the day with the consumption of 4-5 meals/snacks    Improve shortness of breath with ADL's Yes    Intervention Provide education,  individualized exercise plan and daily activity instruction to help decrease symptoms of SOB with activities of daily living.    Expected Outcomes Short Term: Improve cardiorespiratory fitness to achieve a reduction of symptoms when performing ADLs;Long Term: Be able to perform more ADLs without symptoms or delay the onset of symptoms    Hypertension Yes    Intervention Provide education on lifestyle modifcations including regular physical activity/exercise, weight management, moderate sodium restriction and increased consumption of fresh fruit, vegetables, and low fat dairy, alcohol moderation, and smoking cessation.;Monitor prescription use compliance.    Expected Outcomes Short Term: Continued assessment and intervention until BP is < 140/42mm HG in hypertensive participants. < 130/24mm HG in hypertensive participants with diabetes, heart failure or chronic kidney disease.;Long Term: Maintenance of blood pressure at goal levels.    Lipids Yes    Intervention Provide education and support for participant on nutrition & aerobic/resistive exercise along with prescribed medications to achieve LDL 70mg , HDL >40mg .    Expected Outcomes Short Term: Participant states understanding of desired cholesterol values and is compliant with medications prescribed. Participant is following exercise prescription and nutrition guidelines.;Long Term: Cholesterol controlled with medications as prescribed, with individualized exercise RX and with personalized nutrition plan. Value goals: LDL < 70mg , HDL > 40 mg.             Tobacco Use Initial Evaluation: Social History   Tobacco Use  Smoking Status Never  Smokeless Tobacco Never    Exercise Goals and Review:  Exercise Goals     Row Name 12/04/21 1255             Exercise Goals   Increase Physical Activity Yes       Intervention Provide advice, education, support and counseling about physical activity/exercise needs.;Develop an individualized  exercise prescription for aerobic and resistive training based on initial evaluation findings, risk stratification, comorbidities and participant's personal goals.       Expected Outcomes Short Term: Attend rehab on a regular basis to increase amount of physical activity.;Long Term: Add in home exercise to make exercise part of routine and to increase amount of physical activity.;Long Term: Exercising regularly at least 3-5 days a week.       Increase Strength and Stamina Yes       Intervention Provide advice, education, support and counseling about physical activity/exercise needs.;Develop an individualized exercise prescription for aerobic and resistive training based on initial evaluation findings, risk stratification, comorbidities and participant's personal goals.       Expected Outcomes Short Term: Increase workloads from initial exercise prescription for resistance, speed, and METs.;Short Term: Perform resistance training exercises routinely during rehab and add in resistance training at home;Long Term: Improve cardiorespiratory fitness, muscular endurance and strength as measured by increased METs and functional capacity (6MWT)       Able to understand and use rate of perceived exertion (  RPE) scale Yes       Intervention Provide education and explanation on how to use RPE scale       Expected Outcomes Short Term: Able to use RPE daily in rehab to express subjective intensity level;Long Term:  Able to use RPE to guide intensity level when exercising independently       Able to understand and use Dyspnea scale Yes       Intervention Provide education and explanation on how to use Dyspnea scale       Expected Outcomes Short Term: Able to use Dyspnea scale daily in rehab to express subjective sense of shortness of breath during exertion;Long Term: Able to use Dyspnea scale to guide intensity level when exercising independently       Knowledge and understanding of Target Heart Rate Range (THRR) Yes        Intervention Provide education and explanation of THRR including how the numbers were predicted and where they are located for reference       Expected Outcomes Short Term: Able to state/look up THRR;Long Term: Able to use THRR to govern intensity when exercising independently;Short Term: Able to use daily as guideline for intensity in rehab       Able to check pulse independently Yes       Intervention Provide education and demonstration on how to check pulse in carotid and radial arteries.;Review the importance of being able to check your own pulse for safety during independent exercise       Expected Outcomes Short Term: Able to explain why pulse checking is important during independent exercise;Long Term: Able to check pulse independently and accurately       Understanding of Exercise Prescription Yes       Intervention Provide education, explanation, and written materials on patient's individual exercise prescription       Expected Outcomes Short Term: Able to explain program exercise prescription;Long Term: Able to explain home exercise prescription to exercise independently                Copy of goals given to participant.

## 2021-12-04 NOTE — Progress Notes (Signed)
Pulmonary Individual Treatment Plan  Patient Details  Name: Jonathon Snow MRN: [id number] Date of Birth: September 06, 1961 Referring Provider:   Flowsheet Row Pulmonary Rehab from 12/04/2021 in [facility] Cardiac and Pulmonary Rehab  Referring Provider [name] MD       Initial Encounter Date:  Flowsheet Row Pulmonary Rehab from 12/04/2021 in [facility] Cardiac and Pulmonary Rehab  Date 12/04/21       Visit Diagnosis: Asthma, unspecified asthma severity, unspecified whether complicated, unspecified whether persistent  Patient's Home Medications on Admission:  Current Outpatient Medications:    albuterol (VENTOLIN HFA) 108 (90 Base) MCG/ACT inhaler, Inhale 2 puffs into the lungs every 6 (six) hours as needed for wheezing or shortness of breath., Disp: 18 g, Rfl: 2   amLODipine (NORVASC) 5 MG tablet, Take 1 tablet (5 mg total) by mouth daily., Disp: 90 tablet, Rfl: 3   apixaban (ELIQUIS) 5 MG TABS tablet, Take 1 tablet (5 mg total) by mouth 2 (two) times daily., Disp: 60 tablet, Rfl: 2   cholecalciferol (VITAMIN D3) 25 MCG (1000 UNIT) tablet, Take 1,000 Units by mouth daily., Disp: , Rfl:    COVID-19 mRNA bivalent vaccine, Pfizer, (PFIZER COVID-19 VAC BIVALENT) injection, Inject into the muscle., Disp: 0.3 mL, Rfl: 0   ferrous sulfate 325 (65 FE) MG tablet, Take 325 mg by mouth daily with breakfast., Disp: , Rfl:    ipratropium-albuterol (DUONEB) 0.5-2.5 (3) MG/3ML SOLN, Take 3 mLs by nebulization every 4 (four) hours as needed., Disp: 360 mL, Rfl: 3   losartan (COZAAR) 100 MG tablet, Take 1 tablet (100 mg total) by mouth daily., Disp: 90 tablet, Rfl: 3   montelukast (SINGULAIR) 10 MG tablet, Take 1 tablet (10 mg total) by mouth at bedtime., Disp: 90 tablet, Rfl: 3   Multiple Vitamins-Minerals (MULTIVITAMIN WITH MINERALS) tablet, Take 1 tablet by mouth daily., Disp: , Rfl:    mupirocin ointment (BACTROBAN) 2 %, Apply 1 application topically daily. Until healed at bottom of right foot, Disp: 22  g, Rfl: 0   mycophenolate (CELLCEPT) 500 MG tablet, Take 1,000 mg by mouth 2 (two) times daily., Disp: , Rfl:    nystatin-triamcinolone ointment (MYCOLOG), Apply 1 application topically 2 (two) times daily., Disp: 30 g, Rfl: 0   oxybutynin (DITROPAN XL) 15 MG 24 hr tablet, Take 1 tablet (15 mg total) by mouth daily., Disp: 30 tablet, Rfl: 11   predniSONE (DELTASONE) 10 MG tablet, Take 10 mg by mouth daily with breakfast., Disp: , Rfl:    rosuvastatin (CRESTOR) 20 MG tablet, Take one pill every other night before bed. 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   Overview:  superior sagittal sinus, left transverse sinus and cortical veins    COVID-19 virus infection 12/2020   Depression    DVT (deep venous thrombosis) (HCC)    Dyspnea    GERD (gastroesophageal reflux disease)    Heel spur, left 02/16/2019   Heel spur, right 02/16/2019   Hyperlipidemia  Hypertension    Lupus ([place])    Lymphedema 10/07/2018   Morbid obesity ([place])    Opiate abuse, episodic ([place]) 02/26/2018   Osteoporosis    Pneumonia    PONV (postoperative nausea and vomiting)    Postphlebitic syndrome with ulcer, 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   Presence of IVC filter 03/22/2020   Removed   Pulmonary embolism (HCC)    Renal disorder    Stage III    Tobacco Use: Social History   Tobacco Use  Smoking Status Never  Smokeless Tobacco Never    Labs: Recent Review Flowsheet Data     Labs for ITP Cardiac and  Pulmonary Rehab Latest Ref Rng & Units 02/15/2019 01/17/2020 11/07/2020 03/19/2021 09/18/2021   Cholestrol <200 mg/dL 244(H) 227(H) 275(H) - 139   LDLCALC mg/dL (calc) 169(H) 159(H) 196(H) - 76   HDL > OR = 40 mg/dL 50 46 55 - 45   Trlycerides <150 mg/dL 119 108 107 - 92   Hemoglobin A1c <5.7 % of total Hgb - - 5.8(H) 5.8(H) 5.7(H)   PHART 7.350 - 7.450 - - - - -   PCO2ART 32.0 - 48.0 mmHg - - - - -   HCO3 20.0 - 28.0 mmol/L - - - - -   TCO2 0 - 100 mmol/L - - - - -   ACIDBASEDEF 0.0 - 2.0 mmol/L - - - - -   O2SAT % - - - - -        Pulmonary Assessment Scores:  Pulmonary Assessment Scores     Row Name 12/04/21 1237         ADL UCSD   ADL Phase Entry     SOB Score total 79     Rest 1     Walk 5     Stairs 5     Bath 2     Dress 2     Shop 3       CAT Score   CAT Score 20       mMRC Score   mMRC Score 3              UCSD: Self-administered rating of dyspnea associated with activities of daily living (ADLs) 6-point scale (0 = "not at all" to 5 = "maximal or unable to do because of breathlessness")  Scoring Scores range from 0 to 120.  Minimally important difference is 5 units  CAT: CAT can identify the health impairment of COPD patients and is better correlated with disease progression.  CAT has a scoring range of zero to 40. The CAT score is classified into four groups of low (less than 10), medium (10 - 20), high (21-30) and very high (31-40) based on the impact level of disease on health status. A CAT score over 10 suggests significant symptoms.  A worsening CAT score could be explained by an exacerbation, poor medication adherence, poor inhaler technique, or progression of COPD or comorbid conditions.  CAT MCID is 2 points  mMRC: mMRC (Modified Medical Research Council) Dyspnea Scale is used to assess the degree of baseline functional disability in patients of respiratory disease due to dyspnea. No minimal important difference is established. A decrease in score  of 1 point or greater is considered a positive change.   Pulmonary Function Assessment:  Pulmonary Function Assessment - 10/23/21 1538       Breath   Shortness of Breath Yes;Limiting activity;Panic with Shortness of Breath  Exercise Target Goals: Exercise Program Goal: Individual exercise prescription set using results from initial 6 min walk test and THRR while considering  patients activity barriers and safety.   Exercise Prescription Goal: Initial exercise prescription builds to 30-45 minutes a day of aerobic activity, 2-3 days per week.  Home exercise guidelines will be given to patient during program as part of exercise prescription that the participant will acknowledge.  Education: Aerobic Exercise: - Group verbal and visual presentation on the components of exercise prescription. Introduces F.I.T.T principle from ACSM for exercise prescriptions.  Reviews F.I.T.T. principles of aerobic exercise including progression. Written material given at graduation.   Education: Resistance Exercise: - Group verbal and visual presentation on the components of exercise prescription. Introduces F.I.T.T principle from ACSM for exercise prescriptions  Reviews F.I.T.T. principles of resistance exercise including progression. Written material given at graduation.    Education: Exercise & Equipment Safety: - Individual verbal instruction and demonstration of equipment use and safety with use of the equipment. Flowsheet Row Pulmonary Rehab from 10/23/2021 in [facility] Cardiac and Pulmonary Rehab  Date 10/23/21  Educator [facility]  Instruction Review Code 1- Verbalizes Understanding       Education: Exercise Physiology & General Exercise Guidelines: - Group verbal and written instruction with models to review the exercise physiology of the cardiovascular system and associated critical values. Provides general exercise guidelines with specific guidelines to those with heart or lung disease.   Flowsheet Row Pulmonary Rehab from 12/04/2021 in [facility] Cardiac and Pulmonary Rehab  Education need identified 12/04/21       Education: Flexibility, Balance, Mind/Body Relaxation: - Group verbal and visual presentation with interactive activity on the components of exercise prescription. Introduces F.I.T.T principle from ACSM for exercise prescriptions. Reviews F.I.T.T. principles of flexibility and balance exercise training including progression. Also discusses the mind body connection.  Reviews various relaxation techniques to help reduce and manage stress (i.e. Deep breathing, progressive muscle relaxation, and visualization). Balance handout provided to take home. Written material given at graduation.   Activity Barriers & Risk Stratification:  Activity Barriers & Cardiac Risk Stratification - 12/04/21 1245       Activity Barriers & Cardiac Risk Stratification   Activity Barriers Joint Problems;Deconditioning;Muscular Weakness;Back Problems;Shortness of Breath;Other (comment)    Comments Lupus             6 Minute Walk:  6 Minute Walk     Row Name 12/04/21 1246         6 Minute Walk   Phase Initial     Distance 1060 feet     Walk Time 6 minutes     # of Rest Breaks 0     MPH 2     METS 3.37     RPE 11     Perceived Dyspnea  3     VO2 Peak 11.82     Symptoms Yes (comment)     Comments SOB, fatigue     Resting HR 93 bpm     Resting BP 124/72     Resting Oxygen Saturation  98 %     Exercise Oxygen Saturation  during 6 min walk 95 %     Max Ex. HR 118 bpm     Max Ex. BP 154/80     2 Minute Post BP 128/76       Interval HR   1 Minute HR 106     2 Minute HR 108     3 Minute HR 113  4 Minute HR 118     5 Minute HR 114     6 Minute HR 115     2 Minute Post HR 94     Interval Heart Rate? Yes       Interval Oxygen   Interval Oxygen? Yes     Baseline Oxygen Saturation % 98 %     1 Minute Oxygen Saturation % 96 %     1 Minute Liters of Oxygen 0 L  RA      2 Minute Oxygen Saturation % 95 %     2 Minute Liters of Oxygen 0 L     3 Minute Oxygen Saturation % 95 %     3 Minute Liters of Oxygen 0 L     4 Minute Oxygen Saturation % 97 %     4 Minute Liters of Oxygen 0 L     5 Minute Oxygen Saturation % 96 %     5 Minute Liters of Oxygen 0 L     6 Minute Oxygen Saturation % 97 %     6 Minute Liters of Oxygen 0 L     2 Minute Post Oxygen Saturation % 97 %     2 Minute Post Liters of Oxygen 0 L             Oxygen Initial Assessment:  Oxygen Initial Assessment - 12/04/21 1237       Home Oxygen   Home Oxygen Device None    Sleep Oxygen Prescription None    Home Exercise Oxygen Prescription None    Home Resting Oxygen Prescription None    Compliance with Home Oxygen Use Yes      Initial 6 min Walk   Oxygen Used None      Program Oxygen Prescription   Program Oxygen Prescription None      Intervention   Short Term Goals To learn and exhibit compliance with exercise, home and travel O2 prescription;To learn and understand importance of monitoring SPO2 with pulse oximeter and demonstrate accurate use of the pulse oximeter.;To learn and understand importance of maintaining oxygen saturations>88%;To learn and demonstrate proper pursed lip breathing techniques or other breathing techniques. ;To learn and demonstrate proper use of respiratory medications    Long  Term Goals Exhibits compliance with exercise, home  and travel O2 prescription;Verbalizes importance of monitoring SPO2 with pulse oximeter and return demonstration;Maintenance of O2 saturations>88%;Exhibits proper breathing techniques, such as pursed lip breathing or other method taught during program session;Compliance with respiratory medication;Demonstrates proper use of MDIs             Oxygen Re-Evaluation:   Oxygen Discharge (Final Oxygen Re-Evaluation):   Initial Exercise Prescription:  Initial Exercise Prescription - 12/04/21 1200       Date of Initial Exercise RX  and Referring Provider   Date 12/04/21    Referring Provider [name] MD      Oxygen   Maintain Oxygen Saturation 88% or higher      Treadmill   MPH 1.8    Grade 0    Minutes 15    METs 2.38      Recumbant Bike   Level 1    RPM 60    Watts 30    Minutes 15    METs 3.3      NuStep   Level 1    SPM 80    Minutes 15    METs 3.3      Prescription Details  Frequency (times per week) 2    Duration Progress to 30 minutes of continuous aerobic without signs/symptoms of physical distress      Intensity   THRR 40-80% of Max Heartrate 120-147    Ratings of Perceived Exertion 11-13    Perceived Dyspnea 0-4      Progression   Progression Continue to progress workloads to maintain intensity without signs/symptoms of physical distress.      Resistance Training   Training Prescription Yes    Weight 3 lb    Reps 10-15             Perform Capillary Blood Glucose checks as needed.  Exercise Prescription Changes:   Exercise Prescription Changes     Row Name 12/04/21 1200             Response to Exercise   Blood Pressure (Admit) 124/72       Blood Pressure (Exercise) 154/80       Blood Pressure (Exit) 128/76       Heart Rate (Admit) 93 bpm       Heart Rate (Exercise) 118 bpm       Heart Rate (Exit) 94 bpm       Oxygen Saturation (Admit) 98 %       Oxygen Saturation (Exercise) 95 %       Oxygen Saturation (Exit) 97 %       Rating of Perceived Exertion (Exercise) 11       Perceived Dyspnea (Exercise) 3       Symptoms SOB, fatigue       Comments walk test results                Exercise Comments:   Exercise Goals and Review:   Exercise Goals     Row Name 12/04/21 1255             Exercise Goals   Increase Physical Activity Yes       Intervention Provide advice, education, support and counseling about physical activity/exercise needs.;Develop an individualized exercise prescription for aerobic and resistive training based on initial  evaluation findings, risk stratification, comorbidities and participant's personal goals.       Expected Outcomes Short Term: Attend rehab on a regular basis to increase amount of physical activity.;Long Term: Add in home exercise to make exercise part of routine and to increase amount of physical activity.;Long Term: Exercising regularly at least 3-5 days a week.       Increase Strength and Stamina Yes       Intervention Provide advice, education, support and counseling about physical activity/exercise needs.;Develop an individualized exercise prescription for aerobic and resistive training based on initial evaluation findings, risk stratification, comorbidities and participant's personal goals.       Expected Outcomes Short Term: Increase workloads from initial exercise prescription for resistance, speed, and METs.;Short Term: Perform resistance training exercises routinely during rehab and add in resistance training at home;Long Term: Improve cardiorespiratory fitness, muscular endurance and strength as measured by increased METs and functional capacity (6MWT)       Able to understand and use rate of perceived exertion (RPE) scale Yes       Intervention Provide education and explanation on how to use RPE scale       Expected Outcomes Short Term: Able to use RPE daily in rehab to express subjective intensity level;Long Term:  Able to use RPE to guide intensity level when exercising independently       Able to  understand and use Dyspnea scale Yes       Intervention Provide education and explanation on how to use Dyspnea scale       Expected Outcomes Short Term: Able to use Dyspnea scale daily in rehab to express subjective sense of shortness of breath during exertion;Long Term: Able to use Dyspnea scale to guide intensity level when exercising independently       Knowledge and understanding of Target Heart Rate Range (THRR) Yes       Intervention Provide education and explanation of THRR including how  the numbers were predicted and where they are located for reference       Expected Outcomes Short Term: Able to state/look up THRR;Long Term: Able to use THRR to govern intensity when exercising independently;Short Term: Able to use daily as guideline for intensity in rehab       Able to check pulse independently Yes       Intervention Provide education and demonstration on how to check pulse in carotid and radial arteries.;Review the importance of being able to check your own pulse for safety during independent exercise       Expected Outcomes Short Term: Able to explain why pulse checking is important during independent exercise;Long Term: Able to check pulse independently and accurately       Understanding of Exercise Prescription Yes       Intervention Provide education, explanation, and written materials on patient's individual exercise prescription       Expected Outcomes Short Term: Able to explain program exercise prescription;Long Term: Able to explain home exercise prescription to exercise independently                Exercise Goals Re-Evaluation :   Discharge Exercise Prescription (Final Exercise Prescription Changes):  Exercise Prescription Changes - 12/04/21 1200       Response to Exercise   Blood Pressure (Admit) 124/72    Blood Pressure (Exercise) 154/80    Blood Pressure (Exit) 128/76    Heart Rate (Admit) 93 bpm    Heart Rate (Exercise) 118 bpm    Heart Rate (Exit) 94 bpm    Oxygen Saturation (Admit) 98 %    Oxygen Saturation (Exercise) 95 %    Oxygen Saturation (Exit) 97 %    Rating of Perceived Exertion (Exercise) 11    Perceived Dyspnea (Exercise) 3    Symptoms SOB, fatigue    Comments walk test results             Nutrition:  Target Goals: Understanding of nutrition guidelines, daily intake of sodium 1500mg , cholesterol 200mg , calories 30% from fat and 7% or less from saturated fats, daily to have 5 or more servings of fruits and  vegetables.  Education: All About Nutrition: -Group instruction provided by verbal, written material, interactive activities, discussions, models, and posters to present general guidelines for heart healthy nutrition including fat, fiber, MyPlate, the role of sodium in heart healthy nutrition, utilization of the nutrition label, and utilization of this knowledge for meal planning. Follow up email sent as well. Written material given at graduation.   Biometrics:  Pre Biometrics - 12/04/21 1245       Pre Biometrics   Height 6' 2.5" (1.892 m)    Weight 238 lb 3.2 oz (108 kg)    BMI (Calculated) 30.18    Single Leg Stand --   declined             Nutrition Therapy Plan and Nutrition Goals:  Nutrition Therapy &  Goals - 12/04/21 1240       Intervention Plan   Intervention Prescribe, educate and counsel regarding individualized specific dietary modifications aiming towards targeted core components such as weight, hypertension, lipid management, diabetes, heart failure and other comorbidities.    Expected Outcomes Short Term Goal: Understand basic principles of dietary content, such as calories, fat, sodium, cholesterol and nutrients.;Short Term Goal: A plan has been developed with personal nutrition goals set during dietitian appointment.;Long Term Goal: Adherence to prescribed nutrition plan.             Nutrition Assessments:  MEDIFICTS Score Key: ?70 Need to make dietary changes  40-70 Heart Healthy Diet ? 40 Therapeutic Level Cholesterol Diet  Flowsheet Row Pulmonary Rehab from 12/04/2021 in [facility] Cardiac and Pulmonary Rehab  Picture Your Plate Total Score on Admission 44      Picture Your Plate Scores: <64 Unhealthy dietary pattern with much room for improvement. 41-50 Dietary pattern unlikely to meet recommendations for good health and room for improvement. 51-60 More healthful dietary pattern, with some room for improvement.  >60 Healthy dietary pattern, although  there may be some specific behaviors that could be improved.   Nutrition Goals Re-Evaluation:   Nutrition Goals Discharge (Final Nutrition Goals Re-Evaluation):   Psychosocial: Target Goals: Acknowledge presence or absence of significant depression and/or stress, maximize coping skills, provide positive support system. Participant is able to verbalize types and ability to use techniques and skills needed for reducing stress and depression.   Education: Stress, Anxiety, and Depression - Group verbal and visual presentation to define topics covered.  Reviews how body is impacted by stress, anxiety, and depression.  Also discusses healthy ways to reduce stress and to treat/manage anxiety and depression.  Written material given at graduation.   Education: Sleep Hygiene -Provides group verbal and written instruction about how sleep can affect your health.  Define sleep hygiene, discuss sleep cycles and impact of sleep habits. Review good sleep hygiene tips.    Initial Review & Psychosocial Screening:  Initial Psych Review & Screening - 10/23/21 1539       Initial Review   Current issues with Current Psychotropic Meds;Current Sleep Concerns;Current Anxiety/Panic      Family Dynamics   Good Support System? No    Concerns No support system    Comments His family has mostly moved away and he does not have a good support system.      Barriers   Psychosocial barriers to participate in program The patient should benefit from training in stress management and relaxation.;There are no identifiable barriers or psychosocial needs.      Screening Interventions   Interventions Encouraged to exercise;To provide support and resources with identified psychosocial needs;Provide feedback about the scores to participant    Expected Outcomes Short Term goal: Utilizing psychosocial counselor, staff and physician to assist with identification of specific Stressors or current issues interfering with healing  process. Setting desired goal for each stressor or current issue identified.;Long Term Goal: Stressors or current issues are controlled or eliminated.;Short Term goal: Identification and review with participant of any Quality of Life or Depression concerns found by scoring the questionnaire.;Long Term goal: The participant improves quality of Life and PHQ9 Scores as seen by post scores and/or verbalization of changes             Quality of Life Scores:  Scores of 19 and below usually indicate a poorer quality of life in these areas.  A difference of  2-3 points  is a clinically meaningful difference.  A difference of 2-3 points in the total score of the Quality of Life Index has been associated with significant improvement in overall quality of life, self-image, physical symptoms, and general health in studies assessing change in quality of life.  PHQ-9: Recent Review Flowsheet Data     Depression screen [facility] 2/9 12/04/2021 10/07/2021 10/02/2021 09/18/2021 08/30/2021   Decreased Interest 1 0 1 2 1    Down, Depressed, Hopeless 1 0 1 2 0   PHQ - 2 Score 2 0 2 4 1    Altered sleeping 2 0 2 2 1    Tired, decreased energy 3 0 3 2 1    Change in appetite 3 0 2 1 0   Feeling bad or failure about yourself  0 0 2 1 0   Trouble concentrating 1 0 2 3 0   Moving slowly or fidgety/restless 1 0 1 2 0   Suicidal thoughts 0 0 0 0 0   PHQ-9 Score 12 0 14 15 3    Difficult doing work/chores Somewhat difficult Not difficult at all Somewhat difficult Very difficult -      Interpretation of Total Score  Total Score Depression Severity:  1-4 = Minimal depression, 5-9 = Mild depression, 10-14 = Moderate depression, 15-19 = Moderately severe depression, 20-27 = Severe depression   Psychosocial Evaluation and Intervention:  Psychosocial Evaluation - 10/23/21 1541       Psychosocial Evaluation & Interventions   Interventions Encouraged to exercise with the program and follow exercise prescription;Relaxation  education;Stress management education    Comments His family has mostly moved away and he does not have a good support system.    Expected Outcomes Short: Start [facility] to help with mood. Long: Maintain a healthy mental state.    Continue Psychosocial Services  Follow up required by staff             Psychosocial Re-Evaluation:   Psychosocial Discharge (Final Psychosocial Re-Evaluation):   Education: Education Goals: Education classes will be provided on a weekly basis, covering required topics. Participant will state understanding/return demonstration of topics presented.  Learning Barriers/Preferences:  Learning Barriers/Preferences - 10/23/21 1538       Learning Barriers/Preferences   Learning Barriers None    Learning Preferences None             General Pulmonary Education Topics:  Infection Prevention: - Provides verbal and written material to individual with discussion of infection control including proper hand washing and proper equipment cleaning during exercise session. Flowsheet Row Pulmonary Rehab from 10/23/2021 in [facility] Cardiac and Pulmonary Rehab  Date 10/23/21  Educator [facility]  Instruction Review Code 1- Verbalizes Understanding       Falls Prevention: - Provides verbal and written material to individual with discussion of falls prevention and safety. Flowsheet Row Pulmonary Rehab from 10/23/2021 in [facility] Cardiac and Pulmonary Rehab  Date 10/23/21  Educator [facility]  Instruction Review Code 1- Verbalizes Understanding       Chronic Lung Disease Review: - Group verbal instruction with posters, models, PowerPoint presentations and videos,  to review new updates, new respiratory medications, new advancements in procedures and treatments. Providing information on websites and "800" numbers for continued self-education. Includes information about supplement oxygen, available portable oxygen systems, continuous and intermittent flow rates, oxygen safety,  concentrators, and Medicare reimbursement for oxygen. Explanation of Pulmonary Drugs, including class, frequency, complications, importance of spacers, rinsing mouth after steroid MDI's, and proper cleaning methods for nebulizers. Review of basic lung  anatomy and physiology related to function, structure, and complications of lung disease. Review of risk factors. Discussion about methods for diagnosing sleep apnea and types of masks and machines for OSA. Includes a review of the use of types of environmental controls: home humidity, furnaces, filters, dust mite/pet prevention, HEPA vacuums. Discussion about weather changes, air quality and the benefits of nasal washing. Instruction on Warning signs, infection symptoms, calling MD promptly, preventive modes, and value of vaccinations. Review of effective airway clearance, coughing and/or vibration techniques. Emphasizing that all should Create an Action Plan. Written material given at graduation. Flowsheet Row Pulmonary Rehab from 12/04/2021 in [facility] Cardiac and Pulmonary Rehab  Education need identified 12/04/21       AED/CPR: - Group verbal and written instruction with the use of models to demonstrate the basic use of the AED with the basic ABC's of resuscitation.    Anatomy and Cardiac Procedures: - Group verbal and visual presentation and models provide information about basic cardiac anatomy and function. Reviews the testing methods done to diagnose heart disease and the outcomes of the test results. Describes the treatment choices: Medical Management, Angioplasty, or Coronary Bypass Surgery for treating various heart conditions including Myocardial Infarction, Angina, Valve Disease, and Cardiac Arrhythmias.  Written material given at graduation.   Medication Safety: - Group verbal and visual instruction to review commonly prescribed medications for heart and lung disease. Reviews the medication, class of the drug, and side effects. Includes the  steps to properly store meds and maintain the prescription regimen.  Written material given at graduation.   Other: -Provides group and verbal instruction on various topics (see comments)   Knowledge Questionnaire Score:  Knowledge Questionnaire Score - 12/04/21 1237       Knowledge Questionnaire Score   Pre Score 14/18: Lung disease, Oxygen, Exercise              Core Components/Risk Factors/Patient Goals at Admission:  Personal Goals and Risk Factors at Admission - 12/04/21 1255       Core Components/Risk Factors/Patient Goals on Admission    Weight Management Yes;Weight Loss    Intervention Weight Management: Develop a combined nutrition and exercise program designed to reach desired caloric intake, while maintaining appropriate intake of nutrient and fiber, sodium and fats, and appropriate energy expenditure required for the weight goal.;Weight Management: Provide education and appropriate resources to help participant work on and attain dietary goals.;Weight Management/Obesity: Establish reasonable short term and long term weight goals.;Obesity: Provide education and appropriate resources to help participant work on and attain dietary goals.    Admit Weight 238 lb (108 kg)    Goal Weight: Short Term 233 lb (105.7 kg)    Goal Weight: Long Term 215 lb (97.5 kg)    Expected Outcomes Short Term: Continue to assess and modify interventions until short term weight is achieved;Long Term: Adherence to nutrition and physical activity/exercise program aimed toward attainment of established weight goal;Weight Loss: Understanding of general recommendations for a balanced deficit meal plan, which promotes 1-2 lb weight loss per week and includes a negative energy balance of [phone] kcal/d;Understanding recommendations for meals to include 15-35% energy as protein, 25-35% energy from fat, 35-60% energy from carbohydrates, less than 200mg  of dietary cholesterol, 20-35 gm of total fiber  daily;Understanding of distribution of calorie intake throughout the day with the consumption of 4-5 meals/snacks    Improve shortness of breath with ADL's Yes    Intervention Provide education, individualized exercise plan and daily activity instruction to help  decrease symptoms of SOB with activities of daily living.    Expected Outcomes Short Term: Improve cardiorespiratory fitness to achieve a reduction of symptoms when performing ADLs;Long Term: Be able to perform more ADLs without symptoms or delay the onset of symptoms    Hypertension Yes    Intervention Provide education on lifestyle modifcations including regular physical activity/exercise, weight management, moderate sodium restriction and increased consumption of fresh fruit, vegetables, and low fat dairy, alcohol moderation, and smoking cessation.;Monitor prescription use compliance.    Expected Outcomes Short Term: Continued assessment and intervention until BP is < 140/15mm HG in hypertensive participants. < 130/50mm HG in hypertensive participants with diabetes, heart failure or chronic kidney disease.;Long Term: Maintenance of blood pressure at goal levels.    Lipids Yes    Intervention Provide education and support for participant on nutrition & aerobic/resistive exercise along with prescribed medications to achieve LDL 70mg , HDL >40mg .    Expected Outcomes Short Term: Participant states understanding of desired cholesterol values and is compliant with medications prescribed. Participant is following exercise prescription and nutrition guidelines.;Long Term: Cholesterol controlled with medications as prescribed, with individualized exercise RX and with personalized nutrition plan. Value goals: LDL < 70mg , HDL > 40 mg.             Education:Diabetes - Individual verbal and written instruction to review signs/symptoms of diabetes, desired ranges of glucose level fasting, after meals and with exercise. Acknowledge that pre and post  exercise glucose checks will be done for 3 sessions at entry of program.   Know Your Numbers and Heart Failure: - Group verbal and visual instruction to discuss disease risk factors for cardiac and pulmonary disease and treatment options.  Reviews associated critical values for Overweight/Obesity, Hypertension, Cholesterol, and Diabetes.  Discusses basics of heart failure: signs/symptoms and treatments.  Introduces Heart Failure Zone chart for action plan for heart failure.  Written material given at graduation.   Core Components/Risk Factors/Patient Goals Review:    Core Components/Risk Factors/Patient Goals at Discharge (Final Review):    ITP Comments:  ITP Comments     Row Name 10/23/21 1537 12/04/21 1229         ITP Comments Virtual Visit completed. Patient informed on EP and RD appointment and 6 Minute walk test. Patient also informed of patient health questionnaires on My Chart. Patient Verbalizes understanding. Visit diagnosis can be found in [facility] 09/15/2021. Completed 6MWT and gym orientation. Initial ITP created and sent for review to Dr. [name], Medical Director.               Comments: Initial ITP

## 2021-12-11 ENCOUNTER — Other Ambulatory Visit: Payer: Self-pay | Admitting: Family Medicine

## 2021-12-11 DIAGNOSIS — I10 Essential (primary) hypertension: Secondary | ICD-10-CM

## 2021-12-11 MED ORDER — LOSARTAN POTASSIUM 100 MG PO TABS: 100.0000 mg | ORAL_TABLET | Freq: Every day | ORAL | 0 refills | Status: DC

## 2021-12-11 NOTE — Telephone Encounter (Signed)
Medication Refill - Medication: losartan (COZAAR) 100 MG tablet  Has the patient contacted their pharmacy? Yes.   (He said he did call. Preferred Pharmacy (with phone number or street name): [facility] [id number], [place] Has the patient been seen for an appointment in the last year OR does the patient have an upcoming appointment? No.  Pt states he should not have to be seen to get this medication. He took his last pill today and says if he doesn't get refill by today, it will be a big problem.  He cannot function w/out this medication.

## 2021-12-11 NOTE — Telephone Encounter (Signed)
Requested Prescriptions  Pending Prescriptions Disp Refills   losartan (COZAAR) 100 MG tablet 90 tablet 3    Sig: Take 1 tablet (100 mg total) by mouth daily.     Cardiovascular:  Angiotensin Receptor Blockers Failed - 12/11/2021  5:16 PM      Failed - Cr in normal range and within 180 days    Creat  Date Value Ref Range Status  09/18/2021 1.64 (H) 0.70 - 1.30 mg/dL Final   Creatinine, Urine  Date Value Ref Range Status  04/28/2017 74 mg/dL Final         Failed - Last BP in normal range    BP Readings from Last 1 Encounters:  11/01/21 (!) 160/90         Passed - K in normal range and within 180 days    Potassium  Date Value Ref Range Status  09/18/2021 5.1 3.5 - 5.3 mmol/L Final  04/10/2015 3.5 mmol/L Final    Comment:    3.5-5.1 NOTE: New Reference Range  02/27/15          Passed - Patient is not pregnant      Passed - Valid encounter within last 6 months    Recent Outpatient Visits          2 months ago Diarrhea, unspecified type   [facility] Myles Gip, DO   2 months ago Current moderate episode of major depressive disorder, unspecified whether recurrent [facility])   [facility] Bo Merino, FNP   2 months ago SOB (shortness of breath)   [facility] Delsa Grana, PA-C   3 months ago SOB (shortness of breath)   [facility] Steele Sizer, MD   4 months ago Need for Tdap vaccination   [facility], NP      Future Appointments            In 2 weeks Reece Packer, Myna Hidalgo, [facility], [place]   In 1 month Ralene Bathe, MD [place]   In [place], [facility]   In 4 months Hollice Espy, MD [place]

## 2021-12-17 ENCOUNTER — Encounter: Payer: 59 | Admitting: *Deleted

## 2021-12-17 ENCOUNTER — Other Ambulatory Visit: Payer: Self-pay

## 2021-12-17 DIAGNOSIS — J45909 Unspecified asthma, uncomplicated: Secondary | ICD-10-CM

## 2021-12-17 NOTE — Progress Notes (Signed)
Daily Session Note  Patient Details  Name: Jonathon Snow MRN: [id number] Date of Birth: 1961/10/01 Referring Provider:   Flowsheet Row Pulmonary Rehab from 12/04/2021 in [facility] Cardiac and Pulmonary Rehab  Referring Provider [name] MD       Encounter Date: 12/17/2021  Check In:  Session Check In - 12/17/21 0819       Check-In   Supervising physician immediately available to respond to emergencies See telemetry face sheet for immediately available ER MD    Location [facility]-Cardiac & Pulmonary Rehab    Staff Present Heath Lark, RN, BSN, CCRP;Melissa [place], RDN, LDN;Laureen Brown, BS, RRT, CPFT    Virtual Visit No    Medication changes reported     No    Fall or balance concerns reported    No    Warm-up and Cool-down Performed on first and last piece of equipment    Resistance Training Performed Yes    VAD Patient? No    PAD/SET Patient? No      Pain Assessment   Currently in Pain? No/denies                Social History   Tobacco Use  Smoking Status Never  Smokeless Tobacco Never    Goals Met:  Exercise tolerated well Personal goals reviewed No report of concerns or symptoms today  Goals Unmet:  Not Applicable  Comments: First full day of exercise!  Patient was oriented to gym and equipment including functions, settings, policies, and procedures.  Patient's individual exercise prescription and treatment plan were reviewed.  All starting workloads were established based on the results of the 6 minute walk test done at initial orientation visit.  The plan for exercise progression was also introduced and progression will be customized based on patient's performance and goals.    Dr. Emily Filbert is Medical Director for [place].  Dr. [name] is Medical Director for [facility] Pulmonary Rehabilitation.

## 2021-12-18 ENCOUNTER — Encounter: Payer: Self-pay | Admitting: *Deleted

## 2021-12-18 DIAGNOSIS — J45909 Unspecified asthma, uncomplicated: Secondary | ICD-10-CM

## 2021-12-18 NOTE — Progress Notes (Signed)
Pulmonary Individual Treatment Plan  Patient Details  Name: YUNUS STOKLOSA MRN: [id number] Date of Birth: [birth year] Referring Provider:   Flowsheet Row Pulmonary Rehab from 12/04/2021 in [facility] Cardiac and Pulmonary Rehab  Referring Provider [name] MD       Initial Encounter Date:  Flowsheet Row Pulmonary Rehab from 12/04/2021 in [facility] Cardiac and Pulmonary Rehab  Date 12/04/21       Visit Diagnosis: Asthma, unspecified asthma severity, unspecified whether complicated, unspecified whether persistent  Patient's Home Medications on Admission:  Current Outpatient Medications:    albuterol (VENTOLIN HFA) 108 (90 Base) MCG/ACT inhaler, Inhale 2 puffs into the lungs every 6 (six) hours as needed for wheezing or shortness of breath., Disp: 18 g, Rfl: 2   amLODipine (NORVASC) 5 MG tablet, Take 1 tablet (5 mg total) by mouth daily., Disp: 90 tablet, Rfl: 3   apixaban (ELIQUIS) 5 MG TABS tablet, Take 1 tablet (5 mg total) by mouth 2 (two) times daily., Disp: 60 tablet, Rfl: 2   cholecalciferol (VITAMIN D3) 25 MCG (1000 UNIT) tablet, Take 1,000 Units by mouth daily., Disp: , Rfl:    COVID-19 mRNA bivalent vaccine, Pfizer, (PFIZER COVID-19 VAC BIVALENT) injection, Inject into the muscle., Disp: 0.3 mL, Rfl: 0   ferrous sulfate 325 (65 FE) MG tablet, Take 325 mg by mouth daily with breakfast., Disp: , Rfl:    ipratropium-albuterol (DUONEB) 0.5-2.5 (3) MG/3ML SOLN, Take 3 mLs by nebulization every 4 (four) hours as needed., Disp: 360 mL, Rfl: 3   losartan (COZAAR) 100 MG tablet, Take 1 tablet (100 mg total) by mouth daily., Disp: 90 tablet, Rfl: 0   montelukast (SINGULAIR) 10 MG tablet, Take 1 tablet (10 mg total) by mouth at bedtime., Disp: 90 tablet, Rfl: 3   Multiple Vitamins-Minerals (MULTIVITAMIN WITH MINERALS) tablet, Take 1 tablet by mouth daily., Disp: , Rfl:    mupirocin ointment (BACTROBAN) 2 %, Apply 1 application topically daily. Until healed at bottom of right foot, Disp: 22  g, Rfl: 0   mycophenolate (CELLCEPT) 500 MG tablet, Take 1,000 mg by mouth 2 (two) times daily., Disp: , Rfl:    nystatin-triamcinolone ointment (MYCOLOG), Apply 1 application topically 2 (two) times daily., Disp: 30 g, Rfl: 0   oxybutynin (DITROPAN XL) 15 MG 24 hr tablet, Take 1 tablet (15 mg total) by mouth daily., Disp: 30 tablet, Rfl: 11   predniSONE (DELTASONE) 10 MG tablet, Take 10 mg by mouth daily with breakfast., Disp: , Rfl:    rosuvastatin (CRESTOR) 20 MG tablet, Take one pill every other night before bed. 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   Overview:  superior sagittal sinus, left transverse sinus and cortical veins    COVID-19 virus infection 12/2020   Depression    DVT (deep venous thrombosis) (HCC)    Dyspnea    GERD (gastroesophageal reflux disease)    Heel spur, left 02/16/2019   Heel spur, right 02/16/2019   Hyperlipidemia  Hypertension    Lupus ([place])    Lymphedema 10/07/2018   Morbid obesity ([place])    Opiate abuse, episodic ([place]) 02/26/2018   Osteoporosis    Pneumonia    PONV (postoperative nausea and vomiting)    Postphlebitic syndrome with ulcer, 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   Presence of IVC filter 03/22/2020   Removed   Pulmonary embolism (HCC)    Renal disorder    Stage III    Tobacco Use: Social History   Tobacco Use  Smoking Status Never  Smokeless Tobacco Never    Labs: Recent Review Flowsheet Data     Labs for ITP Cardiac and  Pulmonary Rehab Latest Ref Rng & Units 02/15/2019 01/17/2020 11/07/2020 03/19/2021 09/18/2021   Cholestrol <200 mg/dL 244(H) 227(H) 275(H) - 139   LDLCALC mg/dL (calc) 169(H) 159(H) 196(H) - 76   HDL > OR = 40 mg/dL 50 46 55 - 45   Trlycerides <150 mg/dL 119 108 107 - 92   Hemoglobin A1c <5.7 % of total Hgb - - 5.8(H) 5.8(H) 5.7(H)   PHART 7.350 - 7.450 - - - - -   PCO2ART 32.0 - 48.0 mmHg - - - - -   HCO3 20.0 - 28.0 mmol/L - - - - -   TCO2 0 - 100 mmol/L - - - - -   ACIDBASEDEF 0.0 - 2.0 mmol/L - - - - -   O2SAT % - - - - -        Pulmonary Assessment Scores:  Pulmonary Assessment Scores     Row Name 12/04/21 1237         ADL UCSD   ADL Phase Entry     SOB Score total 79     Rest 1     Walk 5     Stairs 5     Bath 2     Dress 2     Shop 3       CAT Score   CAT Score 20       mMRC Score   mMRC Score 3              UCSD: Self-administered rating of dyspnea associated with activities of daily living (ADLs) 6-point scale (0 = "not at all" to 5 = "maximal or unable to do because of breathlessness")  Scoring Scores range from 0 to 120.  Minimally important difference is 5 units  CAT: CAT can identify the health impairment of COPD patients and is better correlated with disease progression.  CAT has a scoring range of zero to 40. The CAT score is classified into four groups of low (less than 10), medium (10 - 20), high (21-30) and very high (31-40) based on the impact level of disease on health status. A CAT score over 10 suggests significant symptoms.  A worsening CAT score could be explained by an exacerbation, poor medication adherence, poor inhaler technique, or progression of COPD or comorbid conditions.  CAT MCID is 2 points  mMRC: mMRC (Modified Medical Research Council) Dyspnea Scale is used to assess the degree of baseline functional disability in patients of respiratory disease due to dyspnea. No minimal important difference is established. A decrease in score  of 1 point or greater is considered a positive change.   Pulmonary Function Assessment:  Pulmonary Function Assessment - 10/23/21 1538       Breath   Shortness of Breath Yes;Limiting activity;Panic with Shortness of Breath  Exercise Target Goals: Exercise Program Goal: Individual exercise prescription set using results from initial 6 min walk test and THRR while considering  patients activity barriers and safety.   Exercise Prescription Goal: Initial exercise prescription builds to 30-45 minutes a day of aerobic activity, 2-3 days per week.  Home exercise guidelines will be given to patient during program as part of exercise prescription that the participant will acknowledge.  Education: Aerobic Exercise: - Group verbal and visual presentation on the components of exercise prescription. Introduces F.I.T.T principle from ACSM for exercise prescriptions.  Reviews F.I.T.T. principles of aerobic exercise including progression. Written material given at graduation.   Education: Resistance Exercise: - Group verbal and visual presentation on the components of exercise prescription. Introduces F.I.T.T principle from ACSM for exercise prescriptions  Reviews F.I.T.T. principles of resistance exercise including progression. Written material given at graduation.    Education: Exercise & Equipment Safety: - Individual verbal instruction and demonstration of equipment use and safety with use of the equipment. Flowsheet Row Pulmonary Rehab from 10/23/2021 in [facility] Cardiac and Pulmonary Rehab  Date 10/23/21  Educator [facility]  Instruction Review Code 1- Verbalizes Understanding       Education: Exercise Physiology & General Exercise Guidelines: - Group verbal and written instruction with models to review the exercise physiology of the cardiovascular system and associated critical values. Provides general exercise guidelines with specific guidelines to those with heart or lung disease.   Flowsheet Row Pulmonary Rehab from 12/04/2021 in [facility] Cardiac and Pulmonary Rehab  Education need identified 12/04/21       Education: Flexibility, Balance, Mind/Body Relaxation: - Group verbal and visual presentation with interactive activity on the components of exercise prescription. Introduces F.I.T.T principle from ACSM for exercise prescriptions. Reviews F.I.T.T. principles of flexibility and balance exercise training including progression. Also discusses the mind body connection.  Reviews various relaxation techniques to help reduce and manage stress (i.e. Deep breathing, progressive muscle relaxation, and visualization). Balance handout provided to take home. Written material given at graduation.   Activity Barriers & Risk Stratification:  Activity Barriers & Cardiac Risk Stratification - 12/04/21 1245       Activity Barriers & Cardiac Risk Stratification   Activity Barriers Joint Problems;Deconditioning;Muscular Weakness;Back Problems;Shortness of Breath;Other (comment)    Comments Lupus             6 Minute Walk:  6 Minute Walk     Row Name 12/04/21 1246         6 Minute Walk   Phase Initial     Distance 1060 feet     Walk Time 6 minutes     # of Rest Breaks 0     MPH 2     METS 3.37     RPE 11     Perceived Dyspnea  3     VO2 Peak 11.82     Symptoms Yes (comment)     Comments SOB, fatigue     Resting HR 93 bpm     Resting BP 124/72     Resting Oxygen Saturation  98 %     Exercise Oxygen Saturation  during 6 min walk 95 %     Max Ex. HR 118 bpm     Max Ex. BP 154/80     2 Minute Post BP 128/76       Interval HR   1 Minute HR 106     2 Minute HR 108     3 Minute HR 113  4 Minute HR 118     5 Minute HR 114     6 Minute HR 115     2 Minute Post HR 94     Interval Heart Rate? Yes       Interval Oxygen   Interval Oxygen? Yes     Baseline Oxygen Saturation % 98 %     1 Minute Oxygen Saturation % 96 %     1 Minute Liters of Oxygen 0 L  RA      2 Minute Oxygen Saturation % 95 %     2 Minute Liters of Oxygen 0 L     3 Minute Oxygen Saturation % 95 %     3 Minute Liters of Oxygen 0 L     4 Minute Oxygen Saturation % 97 %     4 Minute Liters of Oxygen 0 L     5 Minute Oxygen Saturation % 96 %     5 Minute Liters of Oxygen 0 L     6 Minute Oxygen Saturation % 97 %     6 Minute Liters of Oxygen 0 L     2 Minute Post Oxygen Saturation % 97 %     2 Minute Post Liters of Oxygen 0 L             Oxygen Initial Assessment:  Oxygen Initial Assessment - 12/04/21 1237       Home Oxygen   Home Oxygen Device None    Sleep Oxygen Prescription None    Home Exercise Oxygen Prescription None    Home Resting Oxygen Prescription None    Compliance with Home Oxygen Use Yes      Initial 6 min Walk   Oxygen Used None      Program Oxygen Prescription   Program Oxygen Prescription None      Intervention   Short Term Goals To learn and exhibit compliance with exercise, home and travel O2 prescription;To learn and understand importance of monitoring SPO2 with pulse oximeter and demonstrate accurate use of the pulse oximeter.;To learn and understand importance of maintaining oxygen saturations>88%;To learn and demonstrate proper pursed lip breathing techniques or other breathing techniques. ;To learn and demonstrate proper use of respiratory medications    Long  Term Goals Exhibits compliance with exercise, home  and travel O2 prescription;Verbalizes importance of monitoring SPO2 with pulse oximeter and return demonstration;Maintenance of O2 saturations>88%;Exhibits proper breathing techniques, such as pursed lip breathing or other method taught during program session;Compliance with respiratory medication;Demonstrates proper use of MDIs             Oxygen Re-Evaluation:  Oxygen Re-Evaluation     Row Name 12/17/21 0821             Program Oxygen Prescription   Program Oxygen Prescription None         Home Oxygen   Home Oxygen  Device None       Sleep Oxygen Prescription None       Home Exercise Oxygen Prescription None       Home Resting Oxygen Prescription None       Compliance with Home Oxygen Use Yes         Goals/Expected Outcomes   Short Term Goals To learn and demonstrate proper pursed lip breathing techniques or other breathing techniques. ;To learn and demonstrate proper use of respiratory medications       Long  Term Goals Exhibits proper breathing techniques, such as pursed lip breathing  or other method taught during program session;Compliance with respiratory medication;Demonstrates proper use of MDIs       Comments Reviewed PLB technique with pt.  Talked about how it works and it's importance in maintaining their exercise saturations.      Short: Become more profiecient at using PLB.   Long: Become independent at using PLB.       Goals/Expected Outcomes Short: Become more profiecient at using PLB.   Long: Become independent at using PLB.                Oxygen Discharge (Final Oxygen Re-Evaluation):  Oxygen Re-Evaluation - 12/17/21 0821       Program Oxygen Prescription   Program Oxygen Prescription None      Home Oxygen   Home Oxygen Device None    Sleep Oxygen Prescription None    Home Exercise Oxygen Prescription None    Home Resting Oxygen Prescription None    Compliance with Home Oxygen Use Yes      Goals/Expected Outcomes   Short Term Goals To learn and demonstrate proper pursed lip breathing techniques or other breathing techniques. ;To learn and demonstrate proper use of respiratory medications    Long  Term Goals Exhibits proper breathing techniques, such as pursed lip breathing or other method taught during program session;Compliance with respiratory medication;Demonstrates proper use of MDIs    Comments Reviewed PLB technique with pt.  Talked about how it works and it's importance in maintaining their exercise saturations.      Short: Become more profiecient at using PLB.   Long:  Become independent at using PLB.    Goals/Expected Outcomes Short: Become more profiecient at using PLB.   Long: Become independent at using PLB.             Initial Exercise Prescription:  Initial Exercise Prescription - 12/04/21 1200       Date of Initial Exercise RX and Referring Provider   Date 12/04/21    Referring Provider [name] MD      Oxygen   Maintain Oxygen Saturation 88% or higher      Treadmill   MPH 1.8    Grade 0    Minutes 15    METs 2.38      Recumbant Bike   Level 1    RPM 60    Watts 30    Minutes 15    METs 3.3      NuStep   Level 1    SPM 80    Minutes 15    METs 3.3      Prescription Details   Frequency (times per week) 2    Duration Progress to 30 minutes of continuous aerobic without signs/symptoms of physical distress      Intensity   THRR 40-80% of Max Heartrate 120-147    Ratings of Perceived Exertion 11-13    Perceived Dyspnea 0-4      Progression   Progression Continue to progress workloads to maintain intensity without signs/symptoms of physical distress.      Resistance Training   Training Prescription Yes    Weight 3 lb    Reps 10-15             Perform Capillary Blood Glucose checks as needed.  Exercise Prescription Changes:   Exercise Prescription Changes     Row Name 12/04/21 1200             Response to Exercise   Blood Pressure (Admit) 124/72  Blood Pressure (Exercise) 154/80       Blood Pressure (Exit) 128/76       Heart Rate (Admit) 93 bpm       Heart Rate (Exercise) 118 bpm       Heart Rate (Exit) 94 bpm       Oxygen Saturation (Admit) 98 %       Oxygen Saturation (Exercise) 95 %       Oxygen Saturation (Exit) 97 %       Rating of Perceived Exertion (Exercise) 11       Perceived Dyspnea (Exercise) 3       Symptoms SOB, fatigue       Comments walk test results                Exercise Comments:   Exercise Comments     Row Name 12/17/21 0820           Exercise  Comments First full day of exercise!  Patient was oriented to gym and equipment including functions, settings, policies, and procedures.  Patient's individual exercise prescription and treatment plan were reviewed.  All starting workloads were established based on the results of the 6 minute walk test done at initial orientation visit.  The plan for exercise progression was also introduced and progression will be customized based on patient's performance and goals.                Exercise Goals and Review:   Exercise Goals     Row Name 12/04/21 1255             Exercise Goals   Increase Physical Activity Yes       Intervention Provide advice, education, support and counseling about physical activity/exercise needs.;Develop an individualized exercise prescription for aerobic and resistive training based on initial evaluation findings, risk stratification, comorbidities and participant's personal goals.       Expected Outcomes Short Term: Attend rehab on a regular basis to increase amount of physical activity.;Long Term: Add in home exercise to make exercise part of routine and to increase amount of physical activity.;Long Term: Exercising regularly at least 3-5 days a week.       Increase Strength and Stamina Yes       Intervention Provide advice, education, support and counseling about physical activity/exercise needs.;Develop an individualized exercise prescription for aerobic and resistive training based on initial evaluation findings, risk stratification, comorbidities and participant's personal goals.       Expected Outcomes Short Term: Increase workloads from initial exercise prescription for resistance, speed, and METs.;Short Term: Perform resistance training exercises routinely during rehab and add in resistance training at home;Long Term: Improve cardiorespiratory fitness, muscular endurance and strength as measured by increased METs and functional capacity (6MWT)       Able to  understand and use rate of perceived exertion (RPE) scale Yes       Intervention Provide education and explanation on how to use RPE scale       Expected Outcomes Short Term: Able to use RPE daily in rehab to express subjective intensity level;Long Term:  Able to use RPE to guide intensity level when exercising independently       Able to understand and use Dyspnea scale Yes       Intervention Provide education and explanation on how to use Dyspnea scale       Expected Outcomes Short Term: Able to use Dyspnea scale daily in rehab to express subjective sense of shortness of  breath during exertion;Long Term: Able to use Dyspnea scale to guide intensity level when exercising independently       Knowledge and understanding of Target Heart Rate Range (THRR) Yes       Intervention Provide education and explanation of THRR including how the numbers were predicted and where they are located for reference       Expected Outcomes Short Term: Able to state/look up THRR;Long Term: Able to use THRR to govern intensity when exercising independently;Short Term: Able to use daily as guideline for intensity in rehab       Able to check pulse independently Yes       Intervention Provide education and demonstration on how to check pulse in carotid and radial arteries.;Review the importance of being able to check your own pulse for safety during independent exercise       Expected Outcomes Short Term: Able to explain why pulse checking is important during independent exercise;Long Term: Able to check pulse independently and accurately       Understanding of Exercise Prescription Yes       Intervention Provide education, explanation, and written materials on patient's individual exercise prescription       Expected Outcomes Short Term: Able to explain program exercise prescription;Long Term: Able to explain home exercise prescription to exercise independently                Exercise Goals Re-Evaluation :  Exercise  Goals Re-Evaluation     Row Name 12/17/21 0821             Exercise Goal Re-Evaluation   Exercise Goals Review Able to understand and use rate of perceived exertion (RPE) scale;Able to understand and use Dyspnea scale;Knowledge and understanding of Target Heart Rate Range (THRR);Understanding of Exercise Prescription       Comments Reviewed RPE and dyspnea scales, THR and program prescription with pt today.  Pt voiced understanding and was given a copy of goals to take home.       Expected Outcomes Short: Use RPE daily to regulate intensity. Long: Follow program prescription in THR.                Discharge Exercise Prescription (Final Exercise Prescription Changes):  Exercise Prescription Changes - 12/04/21 1200       Response to Exercise   Blood Pressure (Admit) 124/72    Blood Pressure (Exercise) 154/80    Blood Pressure (Exit) 128/76    Heart Rate (Admit) 93 bpm    Heart Rate (Exercise) 118 bpm    Heart Rate (Exit) 94 bpm    Oxygen Saturation (Admit) 98 %    Oxygen Saturation (Exercise) 95 %    Oxygen Saturation (Exit) 97 %    Rating of Perceived Exertion (Exercise) 11    Perceived Dyspnea (Exercise) 3    Symptoms SOB, fatigue    Comments walk test results             Nutrition:  Target Goals: Understanding of nutrition guidelines, daily intake of sodium 1500mg , cholesterol 200mg , calories 30% from fat and 7% or less from saturated fats, daily to have 5 or more servings of fruits and vegetables.  Education: All About Nutrition: -Group instruction provided by verbal, written material, interactive activities, discussions, models, and posters to present general guidelines for heart healthy nutrition including fat, fiber, MyPlate, the role of sodium in heart healthy nutrition, utilization of the nutrition label, and utilization of this knowledge for meal planning. Follow up email sent  as well. Written material given at graduation.   Biometrics:  Pre Biometrics -  12/04/21 1245       Pre Biometrics   Height 6' 2.5" (1.892 m)    Weight 238 lb 3.2 oz (108 kg)    BMI (Calculated) 30.18    Single Leg Stand --   declined             Nutrition Therapy Plan and Nutrition Goals:  Nutrition Therapy & Goals - 12/04/21 1240       Intervention Plan   Intervention Prescribe, educate and counsel regarding individualized specific dietary modifications aiming towards targeted core components such as weight, hypertension, lipid management, diabetes, heart failure and other comorbidities.    Expected Outcomes Short Term Goal: Understand basic principles of dietary content, such as calories, fat, sodium, cholesterol and nutrients.;Short Term Goal: A plan has been developed with personal nutrition goals set during dietitian appointment.;Long Term Goal: Adherence to prescribed nutrition plan.             Nutrition Assessments:  MEDIFICTS Score Key: ?70 Need to make dietary changes  40-70 Heart Healthy Diet ? 40 Therapeutic Level Cholesterol Diet  Flowsheet Row Pulmonary Rehab from 12/04/2021 in [facility] Cardiac and Pulmonary Rehab  Picture Your Plate Total Score on Admission 44      Picture Your Plate Scores: <92 Unhealthy dietary pattern with much room for improvement. 41-50 Dietary pattern unlikely to meet recommendations for good health and room for improvement. 51-60 More healthful dietary pattern, with some room for improvement.  >60 Healthy dietary pattern, although there may be some specific behaviors that could be improved.   Nutrition Goals Re-Evaluation:   Nutrition Goals Discharge (Final Nutrition Goals Re-Evaluation):   Psychosocial: Target Goals: Acknowledge presence or absence of significant depression and/or stress, maximize coping skills, provide positive support system. Participant is able to verbalize types and ability to use techniques and skills needed for reducing stress and depression.   Education: Stress, Anxiety, and  Depression - Group verbal and visual presentation to define topics covered.  Reviews how body is impacted by stress, anxiety, and depression.  Also discusses healthy ways to reduce stress and to treat/manage anxiety and depression.  Written material given at graduation.   Education: Sleep Hygiene -Provides group verbal and written instruction about how sleep can affect your health.  Define sleep hygiene, discuss sleep cycles and impact of sleep habits. Review good sleep hygiene tips.    Initial Review & Psychosocial Screening:  Initial Psych Review & Screening - 10/23/21 1539       Initial Review   Current issues with Current Psychotropic Meds;Current Sleep Concerns;Current Anxiety/Panic      Family Dynamics   Good Support System? No    Concerns No support system    Comments His family has mostly moved away and he does not have a good support system.      Barriers   Psychosocial barriers to participate in program The patient should benefit from training in stress management and relaxation.;There are no identifiable barriers or psychosocial needs.      Screening Interventions   Interventions Encouraged to exercise;To provide support and resources with identified psychosocial needs;Provide feedback about the scores to participant    Expected Outcomes Short Term goal: Utilizing psychosocial counselor, staff and physician to assist with identification of specific Stressors or current issues interfering with healing process. Setting desired goal for each stressor or current issue identified.;Long Term Goal: Stressors or current issues are controlled or eliminated.;Short  Term goal: Identification and review with participant of any Quality of Life or Depression concerns found by scoring the questionnaire.;Long Term goal: The participant improves quality of Life and PHQ9 Scores as seen by post scores and/or verbalization of changes             Quality of Life Scores:  Scores of 19 and  below usually indicate a poorer quality of life in these areas.  A difference of  2-3 points is a clinically meaningful difference.  A difference of 2-3 points in the total score of the Quality of Life Index has been associated with significant improvement in overall quality of life, self-image, physical symptoms, and general health in studies assessing change in quality of life.  PHQ-9: Recent Review Flowsheet Data     Depression screen [facility] 2/9 12/04/2021 10/07/2021 10/02/2021 09/18/2021 08/30/2021   Decreased Interest 1 0 1 2 1    Down, Depressed, Hopeless 1 0 1 2 0   PHQ - 2 Score 2 0 2 4 1    Altered sleeping 2 0 2 2 1    Tired, decreased energy 3 0 3 2 1    Change in appetite 3 0 2 1 0   Feeling bad or failure about yourself  0 0 2 1 0   Trouble concentrating 1 0 2 3 0   Moving slowly or fidgety/restless 1 0 1 2 0   Suicidal thoughts 0 0 0 0 0   PHQ-9 Score 12 0 14 15 3    Difficult doing work/chores Somewhat difficult Not difficult at all Somewhat difficult Very difficult -      Interpretation of Total Score  Total Score Depression Severity:  1-4 = Minimal depression, 5-9 = Mild depression, 10-14 = Moderate depression, 15-19 = Moderately severe depression, 20-27 = Severe depression   Psychosocial Evaluation and Intervention:  Psychosocial Evaluation - 10/23/21 1541       Psychosocial Evaluation & Interventions   Interventions Encouraged to exercise with the program and follow exercise prescription;Relaxation education;Stress management education    Comments His family has mostly moved away and he does not have a good support system.    Expected Outcomes Short: Start [facility] to help with mood. Long: Maintain a healthy mental state.    Continue Psychosocial Services  Follow up required by staff             Psychosocial Re-Evaluation:   Psychosocial Discharge (Final Psychosocial Re-Evaluation):   Education: Education Goals: Education classes will be provided on a weekly  basis, covering required topics. Participant will state understanding/return demonstration of topics presented.  Learning Barriers/Preferences:  Learning Barriers/Preferences - 10/23/21 1538       Learning Barriers/Preferences   Learning Barriers None    Learning Preferences None             General Pulmonary Education Topics:  Infection Prevention: - Provides verbal and written material to individual with discussion of infection control including proper hand washing and proper equipment cleaning during exercise session. Flowsheet Row Pulmonary Rehab from 10/23/2021 in [facility] Cardiac and Pulmonary Rehab  Date 10/23/21  Educator [facility]  Instruction Review Code 1- Verbalizes Understanding       Falls Prevention: - Provides verbal and written material to individual with discussion of falls prevention and safety. Flowsheet Row Pulmonary Rehab from 10/23/2021 in [facility] Cardiac and Pulmonary Rehab  Date 10/23/21  Educator [facility]  Instruction Review Code 1- Verbalizes Understanding       Chronic Lung Disease Review: - Group verbal instruction  with posters, models, PowerPoint presentations and videos,  to review new updates, new respiratory medications, new advancements in procedures and treatments. Providing information on websites and "800" numbers for continued self-education. Includes information about supplement oxygen, available portable oxygen systems, continuous and intermittent flow rates, oxygen safety, concentrators, and Medicare reimbursement for oxygen. Explanation of Pulmonary Drugs, including class, frequency, complications, importance of spacers, rinsing mouth after steroid MDI's, and proper cleaning methods for nebulizers. Review of basic lung anatomy and physiology related to function, structure, and complications of lung disease. Review of risk factors. Discussion about methods for diagnosing sleep apnea and types of masks and machines for OSA. Includes a review of the use of  types of environmental controls: home humidity, furnaces, filters, dust mite/pet prevention, HEPA vacuums. Discussion about weather changes, air quality and the benefits of nasal washing. Instruction on Warning signs, infection symptoms, calling MD promptly, preventive modes, and value of vaccinations. Review of effective airway clearance, coughing and/or vibration techniques. Emphasizing that all should Create an Action Plan. Written material given at graduation. Flowsheet Row Pulmonary Rehab from 12/04/2021 in [facility] Cardiac and Pulmonary Rehab  Education need identified 12/04/21       AED/CPR: - Group verbal and written instruction with the use of models to demonstrate the basic use of the AED with the basic ABC's of resuscitation.    Anatomy and Cardiac Procedures: - Group verbal and visual presentation and models provide information about basic cardiac anatomy and function. Reviews the testing methods done to diagnose heart disease and the outcomes of the test results. Describes the treatment choices: Medical Management, Angioplasty, or Coronary Bypass Surgery for treating various heart conditions including Myocardial Infarction, Angina, Valve Disease, and Cardiac Arrhythmias.  Written material given at graduation.   Medication Safety: - Group verbal and visual instruction to review commonly prescribed medications for heart and lung disease. Reviews the medication, class of the drug, and side effects. Includes the steps to properly store meds and maintain the prescription regimen.  Written material given at graduation.   Other: -Provides group and verbal instruction on various topics (see comments)   Knowledge Questionnaire Score:  Knowledge Questionnaire Score - 12/04/21 1237       Knowledge Questionnaire Score   Pre Score 14/18: Lung disease, Oxygen, Exercise              Core Components/Risk Factors/Patient Goals at Admission:  Personal Goals and Risk Factors at Admission  - 12/04/21 1255       Core Components/Risk Factors/Patient Goals on Admission    Weight Management Yes;Weight Loss    Intervention Weight Management: Develop a combined nutrition and exercise program designed to reach desired caloric intake, while maintaining appropriate intake of nutrient and fiber, sodium and fats, and appropriate energy expenditure required for the weight goal.;Weight Management: Provide education and appropriate resources to help participant work on and attain dietary goals.;Weight Management/Obesity: Establish reasonable short term and long term weight goals.;Obesity: Provide education and appropriate resources to help participant work on and attain dietary goals.    Admit Weight 238 lb (108 kg)    Goal Weight: Short Term 233 lb (105.7 kg)    Goal Weight: Long Term 215 lb (97.5 kg)    Expected Outcomes Short Term: Continue to assess and modify interventions until short term weight is achieved;Long Term: Adherence to nutrition and physical activity/exercise program aimed toward attainment of established weight goal;Weight Loss: Understanding of general recommendations for a balanced deficit meal plan, which promotes 1-2 lb weight loss  per week and includes a negative energy balance of [phone] kcal/d;Understanding recommendations for meals to include 15-35% energy as protein, 25-35% energy from fat, 35-60% energy from carbohydrates, less than 200mg  of dietary cholesterol, 20-35 gm of total fiber daily;Understanding of distribution of calorie intake throughout the day with the consumption of 4-5 meals/snacks    Improve shortness of breath with ADL's Yes    Intervention Provide education, individualized exercise plan and daily activity instruction to help decrease symptoms of SOB with activities of daily living.    Expected Outcomes Short Term: Improve cardiorespiratory fitness to achieve a reduction of symptoms when performing ADLs;Long Term: Be able to perform more ADLs without  symptoms or delay the onset of symptoms    Hypertension Yes    Intervention Provide education on lifestyle modifcations including regular physical activity/exercise, weight management, moderate sodium restriction and increased consumption of fresh fruit, vegetables, and low fat dairy, alcohol moderation, and smoking cessation.;Monitor prescription use compliance.    Expected Outcomes Short Term: Continued assessment and intervention until BP is < 140/83mm HG in hypertensive participants. < 130/50mm HG in hypertensive participants with diabetes, heart failure or chronic kidney disease.;Long Term: Maintenance of blood pressure at goal levels.    Lipids Yes    Intervention Provide education and support for participant on nutrition & aerobic/resistive exercise along with prescribed medications to achieve LDL 70mg , HDL >40mg .    Expected Outcomes Short Term: Participant states understanding of desired cholesterol values and is compliant with medications prescribed. Participant is following exercise prescription and nutrition guidelines.;Long Term: Cholesterol controlled with medications as prescribed, with individualized exercise RX and with personalized nutrition plan. Value goals: LDL < 70mg , HDL > 40 mg.             Education:Diabetes - Individual verbal and written instruction to review signs/symptoms of diabetes, desired ranges of glucose level fasting, after meals and with exercise. Acknowledge that pre and post exercise glucose checks will be done for 3 sessions at entry of program.   Know Your Numbers and Heart Failure: - Group verbal and visual instruction to discuss disease risk factors for cardiac and pulmonary disease and treatment options.  Reviews associated critical values for Overweight/Obesity, Hypertension, Cholesterol, and Diabetes.  Discusses basics of heart failure: signs/symptoms and treatments.  Introduces Heart Failure Zone chart for action plan for heart failure.  Written  material given at graduation.   Core Components/Risk Factors/Patient Goals Review:    Core Components/Risk Factors/Patient Goals at Discharge (Final Review):    ITP Comments:  ITP Comments     Row Name 10/23/21 1537 12/04/21 1229 12/10/21 1527 12/17/21 0820 12/18/21 0731   ITP Comments Virtual Visit completed. Patient informed on EP and RD appointment and 6 Minute walk test. Patient also informed of patient health questionnaires on My Chart. Patient Verbalizes understanding. Visit diagnosis can be found in [facility] 09/15/2021. Completed 6MWT and gym orientation. Initial ITP created and sent for review to Dr. [name], Medical Director. Yitzchak had to delay his 1st day due to falling and wanted to get evaluated for possible injuries. Spoke with patient today who stated he went to Urgent Care and got X Rays and was told nothing was broken. He feels better overall and is planning to start back next week, 12/27 when he returns from out of town. First full day of exercise!  Patient was oriented to gym and equipment including functions, settings, policies, and procedures.  Patient's individual exercise prescription and treatment plan were reviewed.  All starting  workloads were established based on the results of the 6 minute walk test done at initial orientation visit.  The plan for exercise progression was also introduced and progression will be customized based on patient's performance and goals. 30 Day review completed. Medical Director ITP review done, changes made as directed, and signed approval by Medical Director.            Comments:

## 2021-12-19 ENCOUNTER — Other Ambulatory Visit: Payer: Self-pay

## 2021-12-19 DIAGNOSIS — J45909 Unspecified asthma, uncomplicated: Secondary | ICD-10-CM | POA: Diagnosis not present

## 2021-12-19 NOTE — Progress Notes (Signed)
Daily Session Note  Patient Details  Name: Jonathon Snow MRN: [id number] Date of Birth: [birth year] Referring Provider:   Flowsheet Row Pulmonary Rehab from 12/04/2021 in [facility] Cardiac and Pulmonary Rehab  Referring Provider [name] MD       Encounter Date: 12/19/2021  Check In:  Session Check In - 12/19/21 0751       Check-In   Supervising physician immediately available to respond to emergencies See telemetry face sheet for immediately available ER MD    Location [facility]-Cardiac & Pulmonary Rehab    Staff Present Alberteen Sam, MA, RCEP, CCRP, CCET;Amanda Sommer, BA, ACSM CEP, Exercise Physiologist;Jashun Puertas, RN,BC,MSN    Virtual Visit No    Medication changes reported     No    Fall or balance concerns reported    No    Warm-up and Cool-down Performed on first and last piece of equipment    Resistance Training Performed Yes    VAD Patient? No    PAD/SET Patient? No      Pain Assessment   Currently in Pain? No/denies                Social History   Tobacco Use  Smoking Status Never  Smokeless Tobacco Never    Goals Met:  Independence with exercise equipment Exercise tolerated well No report of concerns or symptoms today  Goals Unmet:  Not Applicable  Comments: Pt able to follow exercise prescription today without complaint.  Will continue to monitor for progression.    Dr. Emily Filbert is Medical Director for [place].  Dr. [name] is Medical Director for [facility] Pulmonary Rehabilitation.

## 2021-12-25 ENCOUNTER — Ambulatory Visit: Payer: 59 | Admitting: Podiatry

## 2021-12-26 ENCOUNTER — Ambulatory Visit: Payer: 59 | Admitting: Nurse Practitioner

## 2021-12-26 ENCOUNTER — Other Ambulatory Visit: Payer: Self-pay

## 2021-12-26 ENCOUNTER — Encounter: Payer: 59 | Attending: Pulmonary Disease

## 2021-12-26 DIAGNOSIS — J45909 Unspecified asthma, uncomplicated: Secondary | ICD-10-CM | POA: Insufficient documentation

## 2021-12-26 NOTE — Progress Notes (Signed)
Daily Session Note  Patient Details  Name: Jonathon Snow MRN: [id number] Date of Birth: May 15, 1961 Referring Provider:   Flowsheet Row Pulmonary Rehab from 12/04/2021 in [facility] Cardiac and Pulmonary Rehab  Referring Provider [name] MD       Encounter Date: 12/26/2021  Check In:  Session Check In - 12/26/21 [place]       Check-In   Supervising physician immediately available to respond to emergencies See telemetry face sheet for immediately available ER MD    Location [facility]-Cardiac & Pulmonary Rehab    Staff Present Birdie Sons, MPA, RN;Amanda Oletta Darter, BA, ACSM CEP, Exercise Physiologist;Kara Eliezer Bottom, MS, ASCM CEP, Exercise Physiologist    Virtual Visit No    Medication changes reported     No    Fall or balance concerns reported    No    Warm-up and Cool-down Performed on first and last piece of equipment    Resistance Training Performed Yes    VAD Patient? No    PAD/SET Patient? No      Pain Assessment   Currently in Pain? No/denies                Social History   Tobacco Use  Smoking Status Never  Smokeless Tobacco Never    Goals Met:  Independence with exercise equipment Exercise tolerated well No report of concerns or symptoms today Strength training completed today  Goals Unmet:  Not Applicable  Comments: Pt able to follow exercise prescription today without complaint.  Will continue to monitor for progression.    Dr. Emily Filbert is Medical Director for [place].  Dr. [name] is Medical Director for [facility] Pulmonary Rehabilitation.

## 2021-12-30 ENCOUNTER — Other Ambulatory Visit: Payer: Self-pay

## 2021-12-30 ENCOUNTER — Encounter: Payer: 59 | Admitting: *Deleted

## 2021-12-30 ENCOUNTER — Ambulatory Visit: Payer: 59 | Admitting: Nurse Practitioner

## 2021-12-30 DIAGNOSIS — J45909 Unspecified asthma, uncomplicated: Secondary | ICD-10-CM

## 2021-12-30 NOTE — Progress Notes (Signed)
Daily Session Note  Patient Details  Name: Jonathon Snow MRN: [id number] Date of Birth: 1961-10-22 Referring Provider:   Flowsheet Row Pulmonary Rehab from 12/04/2021 in [facility] Cardiac and Pulmonary Rehab  Referring Provider [name] MD       Encounter Date: 12/30/2021  Check In:  Session Check In - 12/30/21 0802       Check-In   Supervising physician immediately available to respond to emergencies See telemetry face sheet for immediately available ER MD    Location [facility]-Cardiac & Pulmonary Rehab    Staff Present Earlean Shawl, BS, ACSM CEP, Exercise Physiologist;Joseph [place], [place];Heath Lark, RN, BSN, CCRP    Virtual Visit No    Medication changes reported     No    Fall or balance concerns reported    No    Warm-up and Cool-down Performed on first and last piece of equipment    Resistance Training Performed Yes    VAD Patient? No    PAD/SET Patient? No      Pain Assessment   Currently in Pain? No/denies                Social History   Tobacco Use  Smoking Status Never  Smokeless Tobacco Never    Goals Met:  Proper associated with RPD/PD & O2 Sat Independence with exercise equipment Exercise tolerated well No report of concerns or symptoms today  Goals Unmet:  Not Applicable  Comments: Pt able to follow exercise prescription today without complaint.  Will continue to monitor for progression.    Dr. Emily Filbert is Medical Director for [place].  Dr. [name] is Medical Director for [facility] Pulmonary Rehabilitation.

## 2021-12-31 ENCOUNTER — Ambulatory Visit: Payer: 59 | Admitting: Neurology

## 2021-12-31 ENCOUNTER — Other Ambulatory Visit: Payer: Self-pay

## 2021-12-31 DIAGNOSIS — J45909 Unspecified asthma, uncomplicated: Secondary | ICD-10-CM

## 2021-12-31 NOTE — Progress Notes (Signed)
Daily Session Note  Patient Details  Name: Jonathon Snow MRN: [id number] Date of Birth: 1961/11/08 Referring Provider:   Flowsheet Row Pulmonary Rehab from 12/04/2021 in [facility] Cardiac and Pulmonary Rehab  Referring Provider [name] MD       Encounter Date: 12/31/2021  Check In:  Session Check In - 12/31/21 0747       Check-In   Supervising physician immediately available to respond to emergencies See telemetry face sheet for immediately available ER MD    Location [facility]-Cardiac & Pulmonary Rehab    Staff Present Birdie Sons, MPA, RN;Jessica Luan Pulling, MA, RCEP, CCRP, CCET;Amanda Sommer, BA, ACSM CEP, Exercise Physiologist    Virtual Visit No    Medication changes reported     No    Fall or balance concerns reported    No    Warm-up and Cool-down Performed on first and last piece of equipment    Resistance Training Performed Yes    VAD Patient? No    PAD/SET Patient? No      Pain Assessment   Currently in Pain? No/denies                Social History   Tobacco Use  Smoking Status Never  Smokeless Tobacco Never    Goals Met:  Independence with exercise equipment Exercise tolerated well No report of concerns or symptoms today Strength training completed today  Goals Unmet:  Not Applicable  Comments: Pt able to follow exercise prescription today without complaint.  Will continue to monitor for progression.    Dr. Emily Filbert is Medical Director for [place].  Dr. [name] is Medical Director for [facility] Pulmonary Rehabilitation.

## 2021-12-31 NOTE — Progress Notes (Signed)
Completed initial RD consultation ?

## 2022-01-01 ENCOUNTER — Emergency Department
Admission: EM | Admit: 2022-01-01 | Discharge: 2022-01-01 | Disposition: A | Discharge status: Home / Self Care | Payer: 59 | Attending: Emergency Medicine | Admitting: Emergency Medicine

## 2022-01-01 ENCOUNTER — Other Ambulatory Visit: Payer: Self-pay

## 2022-01-01 ENCOUNTER — Encounter: Payer: Self-pay | Admitting: Medical Oncology

## 2022-01-01 DIAGNOSIS — I878 Other specified disorders of veins: Secondary | ICD-10-CM | POA: Diagnosis not present

## 2022-01-01 DIAGNOSIS — L03116 Cellulitis of left lower limb: Secondary | ICD-10-CM | POA: Insufficient documentation

## 2022-01-01 DIAGNOSIS — I872 Venous insufficiency (chronic) (peripheral): Secondary | ICD-10-CM

## 2022-01-01 MED ORDER — TRAMADOL HCL 50 MG PO TABS
50.0000 mg | ORAL_TABLET | Freq: Four times a day (QID) | ORAL | 0 refills | Status: DC | PRN
Start: 2022-01-01 — End: 2022-03-04

## 2022-01-01 MED ORDER — DOXYCYCLINE HYCLATE 100 MG PO TABS: 100.0000 mg | ORAL_TABLET | Freq: Two times a day (BID) | ORAL | 0 refills | Status: DC

## 2022-01-01 MED ORDER — DOXYCYCLINE HYCLATE 100 MG PO TABS
100.0000 mg | ORAL_TABLET | Freq: Once | ORAL | Status: AC
  Administered 2022-01-01: 100 mg via ORAL
  Filled 2022-01-01: qty 1

## 2022-01-01 MED ORDER — TRAMADOL HCL 50 MG PO TABS
50.0000 mg | ORAL_TABLET | Freq: Once | ORAL | Status: AC
  Administered 2022-01-01: 50 mg via ORAL
  Filled 2022-01-01: qty 1

## 2022-01-01 NOTE — ED Triage Notes (Signed)
Pt ambulatory to triage with reports of left ankle pain. Area assessed and appears red and swollen. Pt denies fever.

## 2022-01-01 NOTE — ED Provider Notes (Signed)
° °  [facility] Provider Note    Event Date/Time   First MD Initiated Contact with Patient 01/01/22 1234     (approximate)   History   Cellulitis   HPI  Jonathon Snow is a 61 y.o. male with a history of DVT, IVC filter, on blood thinners, chronic kidney disease who presents with complaints of pain and redness at the left lower inner leg.  He reports this developed yesterday, he reports of burning pain in his leg.  No fevers reported     Physical Exam   Triage Vital Signs: ED Triage Vitals [01/01/22 1144]  Enc Vitals Group     BP (!) 165/95     Pulse Rate 76     Resp 18     Temp 98.1 F (36.7 C)     Temp Source Oral     SpO2 99 %     Weight 108.9 kg (240 lb)     Height 1.88 m (6\' 2" )     Head Circumference      Peak Flow      Pain Score 9     Pain Loc      Pain Edu?      Excl. in [place]?     Most recent vital signs: Vitals:   01/01/22 1144  BP: (!) 165/95  Pulse: 76  Resp: 18  Temp: 98.1 F (36.7 C)  SpO2: 99%     General: Awake, no distress.  CV:  Good peripheral perfusion.  Resp:  Normal effort.  Abd:  No distention.  Other:  Left lower leg: Warm and well perfused, 1+ DP pulse.  Area of erythema left medial lower leg, slight streaking noted superiorly.  No fluctuance or abscess.   ED Results / Procedures / Treatments   Labs (all labs ordered are listed, but only abnormal results are displayed) Labs Reviewed - No data to display   EKG     RADIOLOGY     PROCEDURES:  Critical Care performed:   Procedures   MEDICATIONS ORDERED IN ED: Medications  doxycycline (VIBRA-TABS) tablet 100 mg (100 mg Oral Given 01/01/22 1305)  traMADol (ULTRAM) tablet 50 mg (50 mg Oral Given 01/01/22 1305)     IMPRESSION / MDM / ASSESSMENT AND PLAN / ED COURSE  I reviewed the triage vital signs and the nursing notes.  Patient's exam is most consistent with venous insufficiency dermatitis which has now, cellulitis with some streaking.   Overall he is well-appearing and in no acute distress.  Afebrile.  We will treat the patient with a prescription doxycycline, and analgesics, I recommended follow-up with Dr. Lucky Cowboy given evidence of likely venous insufficiency.            FINAL CLINICAL IMPRESSION(S) / ED DIAGNOSES   Final diagnoses:  Cellulitis of left lower extremity  Venous stasis dermatitis of left lower extremity     Rx / DC Orders   ED Discharge Orders          Ordered    doxycycline (VIBRA-TABS) 100 MG tablet  2 times daily        01/01/22 1252    traMADol (ULTRAM) 50 MG tablet  Every 6 hours PRN        01/01/22 1252             Note:  This document was prepared using Dragon voice recognition software and may include unintentional dictation errors.   Lavonia Drafts, MD 01/01/22 1444

## 2022-01-03 ENCOUNTER — Other Ambulatory Visit: Payer: Self-pay

## 2022-01-03 ENCOUNTER — Ambulatory Visit (INDEPENDENT_AMBULATORY_CARE_PROVIDER_SITE_OTHER): Payer: 59 | Admitting: Vascular Surgery

## 2022-01-03 ENCOUNTER — Encounter (INDEPENDENT_AMBULATORY_CARE_PROVIDER_SITE_OTHER): Payer: Self-pay | Admitting: Vascular Surgery

## 2022-01-03 VITALS — BP 117/79 | HR 97 | Ht 75.0 in | Wt 236.0 lb

## 2022-01-03 DIAGNOSIS — R7303 Prediabetes: Secondary | ICD-10-CM

## 2022-01-03 DIAGNOSIS — I8312 Varicose 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: Secondary | ICD-10-CM

## 2022-01-03 DIAGNOSIS — I10 Essential (primary) hypertension: Secondary | ICD-10-CM

## 2022-01-03 DIAGNOSIS — E782 Mixed hyperlipidemia: Secondary | ICD-10-CM

## 2022-01-03 DIAGNOSIS — I872 Venous insufficiency (chronic) (peripheral): Secondary | ICD-10-CM

## 2022-01-03 DIAGNOSIS — L03116 Cellulitis of left lower limb: Secondary | ICD-10-CM | POA: Insufficient documentation

## 2022-01-03 NOTE — Progress Notes (Signed)
MRN : [id number]  Jonathon Snow is a 61 y.o. (1961/03/20) male who presents with chief complaint of  Chief Complaint  Patient presents with   Follow-up    Follow up , ,pt is follow up from hospital ED visit on Monday   .  History of Present Illness: Patient returns today in follow up of after recent ER visit for cellulitis of the left leg earlier this week.  He was started on antibiotics and remains on these.  The left lower leg is erythematous and has a large patch of stasis dermatitis that is very inflamed and tender.  He had fevers although they are better with antibiotics.  There is increased swelling as well.  He previously had a left leg reflux study which showed significant reflux in the left leg a few months ago.  He was wearing compression socks elevating his legs, and doing appropriate conservative therapies.  Current Outpatient Medications  Medication Sig Dispense Refill   albuterol (VENTOLIN HFA) 108 (90 Base) MCG/ACT inhaler Inhale 2 puffs into the lungs every 6 (six) hours as needed for wheezing or shortness of breath. 18 g 2   amLODipine (NORVASC) 5 MG tablet Take 1 tablet (5 mg total) by mouth daily. 90 tablet 3   apixaban (ELIQUIS) 5 MG TABS tablet Take 1 tablet (5 mg total) by mouth 2 (two) times daily. 60 tablet 2   cholecalciferol (VITAMIN D3) 25 MCG (1000 UNIT) tablet Take 1,000 Units by mouth daily.     COVID-19 mRNA bivalent vaccine, Pfizer, (PFIZER COVID-19 VAC BIVALENT) injection Inject into the muscle. 0.3 mL 0   doxycycline (VIBRA-TABS) 100 MG tablet Take 1 tablet (100 mg total) by mouth 2 (two) times daily. 14 tablet 0   ferrous sulfate 325 (65 FE) MG tablet Take 325 mg by mouth daily with breakfast.     ipratropium-albuterol (DUONEB) 0.5-2.5 (3) MG/3ML SOLN Take 3 mLs by nebulization every 4 (four) hours as needed. 360 mL 3   losartan (COZAAR) 100 MG tablet Take 1 tablet (100 mg total) by mouth daily. 90 tablet 0   montelukast (SINGULAIR) 10 MG tablet Take 1  tablet (10 mg total) by mouth at bedtime. 90 tablet 3   Multiple Vitamins-Minerals (MULTIVITAMIN WITH MINERALS) tablet Take 1 tablet by mouth daily.     mupirocin ointment (BACTROBAN) 2 % Apply 1 application topically daily. Until healed at bottom of right foot 22 g 0   mycophenolate (CELLCEPT) 500 MG tablet Take 1,000 mg by mouth 2 (two) times daily.     nystatin-triamcinolone ointment (MYCOLOG) Apply 1 application topically 2 (two) times daily. 30 g 0   oxybutynin (DITROPAN XL) 15 MG 24 hr tablet Take 1 tablet (15 mg total) by mouth daily. 30 tablet 11   predniSONE (DELTASONE) 10 MG tablet Take 10 mg by mouth daily with breakfast.     rosuvastatin (CRESTOR) 20 MG tablet Take one pill every other night before bed. (Patient taking differently: Take 20 mg by mouth daily.) 45 tablet 0   sertraline (ZOLOFT) 25 MG tablet Take 1 tablet (25 mg total) by mouth daily. 30 tablet 0   sildenafil (REVATIO) 20 MG tablet 3-5 tablets as needed 30 min prior to intercourse 30 tablet 11   tamsulosin (FLOMAX) 0.4 MG CAPS capsule Take 1 capsule (0.4 mg total) by mouth daily. 30 capsule 11   traMADol (ULTRAM) 50 MG tablet Take 1 tablet (50 mg total) by mouth every 6 (six) hours as needed. 20 tablet 0  No current facility-administered medications for this visit.    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   Overview:  superior sagittal sinus, left transverse sinus and cortical veins    COVID-19 virus infection 12/2020   Depression    DVT (deep venous thrombosis) (HCC)    Dyspnea    GERD (gastroesophageal reflux disease)    Heel spur, left 02/16/2019   Heel spur, right 02/16/2019   Hyperlipidemia    Hypertension    Lupus ([place])    Lymphedema 10/07/2018   Morbid obesity ([place])    Opiate abuse, episodic ([place]) 02/26/2018   Osteoporosis    Pneumonia    PONV (postoperative nausea and vomiting)    Postphlebitic syndrome with ulcer,  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   Presence of IVC filter 03/22/2020   Removed   Pulmonary embolism ([place])    Renal disorder    Stage III    Past Surgical History:  Procedure Laterality Date   ANKLE SURGERY Right    BRONCHIAL WASHINGS N/A 11/01/2021   Procedure: BRONCHIAL WASHINGS;  Surgeon: [name], MD;  Location: [facility] ORS;  Service: Thoracic;  Laterality: N/A;   COLONOSCOPY WITH PROPOFOL N/A 05/28/2020   Procedure: COLONOSCOPY WITH PROPOFOL;  Surgeon: Jonathon Bellows, MD;  [facility] END[facility];  Service: Endoscopy;  Laterality: N/A;   CYST EXCISION  92 or 93    Liver cyst removal [facility]   FLEXIBLE BRONCHOSCOPY N/A 11/01/2021   Procedure: FLEXIBLE BRONCHOSCOPY;  Surgeon: [name], MD;  Location: [facility] ORS;  Service: Thoracic;  Laterality: N/A;   I & D EXTREMITY Right 04/29/2017   Procedure: IRRIGATION AND DEBRIDEMENT EXTREMITY;  Surgeon: Clayburn Pert, MD;  Location: [facility] ORS;  Service: General;  Laterality: Right;   IRRIGATION AND DEBRIDEMENT ABSCESS Left 04/29/2017   Procedure: IRRIGATION AND DEBRIDEMENT Scrotal ABSCESS;  Surgeon: Clayburn Pert, MD;  Location: [facility] ORS;  Service: General;  Laterality: Left;     Social History   Tobacco Use   Smoking status: Never   Smokeless tobacco: Never  Vaping Use   Vaping Use: Never used  Substance Use Topics   Alcohol use: No   Drug use: No    Comment: PT DENIES      Family History  Problem Relation Age of Onset   Hypertension Father    Heart disease Father    Clotting disorder Mother    Kidney disease Brother    Heart attack Maternal Grandmother    Heart attack Maternal Grandfather    Heart attack Paternal Grandfather      Allergies  Allergen Reactions   Enalapril Other (See Comments)    Unknown reaction   Vicodin [Hydrocodone-Acetaminophen] Hives and Rash    Severe headaches (also) NAME BRAND ONLY PER PT CAN TAKE GENERIC    REVIEW OF SYSTEMS (Negative unless checked)   Constitutional: _0 Weight loss  _1 Fever   _2 Chills Cardiac: _3 Chest pain   _4 Chest pressure   _5 Palpitations   _6 Shortness of breath when laying flat   _7 Shortness of breath at rest   _8 Shortness of breath with exertion. Vascular:  _9 Pain in legs with walking   _10 Pain in legs at rest   _11 Pain in legs when laying flat   _12 Claudication   _13 Pain in feet when walking  _14 Pain in feet at rest  _15 Pain in feet when laying flat   _16 History of DVT   _17 Phlebitis   _18 Swelling in legs   _19 Varicose veins   _20 Non-healing ulcers  Pulmonary:   _0 Uses home oxygen   _1 Productive cough   _2 Hemoptysis   _3 Wheeze  _4 COPD   _5 Asthma Neurologic:  _6 Dizziness  _7 Blackouts   _8 Seizures   _9 History of stroke   _10 History of TIA  _11 Aphasia   _12 Temporary blindness   _13 Dysphagia   _14 Weakness or numbness in arms   _15 Weakness or numbness in legs Musculoskeletal:  _16 Arthritis   _17 Joint swelling   _18 Joint pain   _19 Low back pain Hematologic:  _20 Easy bruising  _21 Easy bleeding   _22 Hypercoagulable state   _23 Anemic  _24 Hepatitis Gastrointestinal:  _25 Blood in stool   _26 Vomiting blood  _27 Gastroesophageal reflux/heartburn   _28 Abdominal pain Genitourinary:  _29 Chronic kidney disease   _30 Difficult urination  _31 Frequent urination  _32 Burning with urination   _33 Hematuria Skin:  _34 Rashes   _35 Ulcers   _36 Wounds Psychological:  _37 History of anxiety   _38  History of major depression.  Physical Examination  BP 117/79    Pulse 97    Ht _39  (1.905 m)    Wt 236 lb (107 kg)    BMI 29.50 kg/m  Gen:  WD/WN, NAD Head: [place]/AT, No temporalis wasting. Ear/Nose/Throat: Hearing grossly intact, nares w/o erythema or drainage Eyes: Conjunctiva clear. Sclera non-icteric Neck: Supple.  Trachea midline Pulmonary:  Good air movement, no use of accessory muscles.  Cardiac: RRR, no JVD Vascular:  Vessel Right Left  Radial Palpable Palpable                          PT 2+ palpable 1+ palpable  DP 2+ palpable 1+ palpable   Gastrointestinal: soft, non-tender/non-distended. No guarding/reflex.   Musculoskeletal: M/S 5/5 throughout.  No deformity or atrophy.  Erythema of the left lower leg with marked stasis dermatitis changes.  1-2+ left lower extremity Neurologic: Sensation grossly intact in extremities.  Symmetrical.  Speech is fluent.  Psychiatric: Judgment intact, Mood & affect appropriate for pt's clinical situation. Dermatologic: No rashes or ulcers noted.  No cellulitis or open wounds.      Labs Recent Results (from the past 2160 hour(s))  C. difficile GDH and Toxin A/B     Status: None   Collection Time: 10/10/21  9:06 AM  Result Value Ref Range   MICRO NUMBER: [id number]    SPECIMEN QUALITY: Adequate    Source STOOL    STATUS: FINAL    GDH ANTIGEN Not Detected    TOXIN A AND B Not Detected    COMMENT      No toxigenic C. difficile detected For additional information, please refer to http://education.[facility].com/faq/FAQ136 (This link is being provided for informational/educational purposes only.)  Ova and parasite examination     Status: None   Collection Time: 10/10/21  9:06 AM   Specimen: Stool  Result Value Ref Range   MICRO NUMBER: [id number]    SPECIMEN QUALITY: Adequate    Source STOOL    STATUS: FINAL    CONCENTRATE RESULT: No ova or parasites seen    TRICHROME RESULT: No ova or parasites seen    COMMENT:      Routine Ova and Parasite exam may not detect some parasites that occasionally cause diarrheal illness. Cryptosporidium Antigen and/or Cyclospora and Isospora Exam may be ordered to detect these parasites. One negative sample does not necessarily rule out  the presence of a parasitic infection.  For additional information, please refer to https://education.questdiagn[facility].com/faq/FAQ203 (This link is being provided for informational/ educational purposes only.)   BLADDER SCAN AMB NON-IMAGING     Status:  None   Collection Time: 10/29/21 11:23 AM  Result Value Ref Range   Scan Result 54m   Protime-INR     Status: Abnormal   Collection Time:  10/30/21  8:39 AM  Result Value Ref Range   Prothrombin Time 15.9 (H) 11.4 - 15.2 seconds   INR 1.3 (H) 0.8 - 1.2    Comment: (NOTE) INR goal varies based on device and disease states. [facility] [address], [place] [address]  APTT     Status: None   Collection Time: 10/30/21  8:39 AM  Result Value Ref Range   aPTT 28 24 - 36 seconds    Comment: Performed at [facility] [id number] SARS CORONAVIRUS 2 (TAT 6-24 HRS) Nasopharyngeal Nasopharyngeal Swab     Status: None   Collection Time: 10/30/21  8:46 AM   Specimen: Nasopharyngeal Swab  Result Value Ref Range   SARS Coronavirus 2 NEGATIVE NEGATIVE    Comment: (NOTE) SARS-CoV-2 target nucleic acids are NOT DETECTED.  The SARS-CoV-2 RNA is generally detectable in upper and lower respiratory specimens during the acute phase of infection. Negative results do not preclude SARS-CoV-2 infection, do not rule out co-infections with other pathogens, and should not be used as the sole basis for treatment or other patient management decisions. Negative results must be combined with clinical observations, patient history, and epidemiological information. The expected result is Negative.  Fact Sheet for Patients: hSugarRoll.be Fact Sheet for Healthcare Providers: hhttps://www.woods-mathews.com/ This test is not yet approved or cleared by the [place] and  has been authorized for detection and/or diagnosis of SARS-CoV-2 by FDA under an Emergency Use Authorization (EUA). This EUA will remain  in effect (meaning this test can be used) for the duration of the COVID-19 declaration under Se ction 564(b)(1) of the Act, 21 U.S.C. section 360bbb-3(b)(1), unless the authorization is terminated or revoked sooner.  Performed at [facility] [address], [place] [address]  Aspergillus Ag,  BAL/Serum     Status: None   Collection Time: 11/01/21 12:51 PM   Specimen: Bronchial Alveolar Lavage; Respiratory  Result Value Ref Range   Aspergillus Ag, BAL/Serum 0.08 0.00 - 0.49 Index    Comment: (NOTE) Performed At: [id number] FarmerMD PSV:[id number]  Culture, BAL-quantitative w Gram Stain     Status: None   Collection Time: 11/01/21 12:51 PM   Specimen: Bronchoalveolar Lavage; Respiratory  Result Value Ref Range   Specimen Description      BRONCHIAL ALVEOLAR LAVAGE Performed at [facility] [address] [place] [address]   Special Requests      NONE [facility]formed at [facility] [place] [address] [address]   Gram Stain NO WBC SEEN NO ORGANISMS SEEN     Culture      NO GROWTH 2 DAYS Performed at [facility] [address], [place] [address]   Report Status 11/05/2021 FINAL   Acid Fast Smear (AFB)     Status: None   Collection Time: 11/01/21 12:51 PM   Specimen: Bronchoalveolar Lavage; Respiratory  Result Value Ref Range   AFB Specimen Processing Concentration    Acid Fast Smear Negative     Comment: (NOTE) Performed At: [facility] [id number] FarmerMD PTD:[id number]  Source (AFB) BRONCHIAL ALVEOLAR LAVAGE     Comment: Performed at [facility], [address] [place], [place] [address]  Culture, fungus without smear     Status: None   Collection Time: 11/01/21 12:51 PM   Specimen: Bronchoalveolar Lavage; Other  Result Value Ref Range   Specimen Description      BRONCHIAL ALVEOLAR LAVAGE Performed at [facility], [address] [place], [place] [address]    Special Requests      NONE Performed at [facility], [address], [place], [place] [address]    Culture      NO FUNGUS ISOLATED AFTER 21 DAYS Performed at [facility], [place]  [address] [place], [place] [address]    Report Status 11/22/2021 FINAL   Bordetella pertussis PCR     Status: None   Collection Time: 11/01/21 12:51 PM   Specimen: Bronchoalveolar Lavage; Nasopharyngeal  Result Value Ref Range   B pertussis, DNA Negative Negative   B parapertussis, DNA Negative Negative    Comment: (NOTE) This test was developed and its performance characteristics determined by [employer]. It has not been cleared or approved by the U.S. Food and Drug Administration. The FDA has determined that such clearance or approval is not necessary. This test is used for clinical purposes. It should not be regarded as investigational or research. Performed At: [facility] [place], [place] [id number] Rush Farmer MD HQ:[id number]   Virus culture     Status: None   Collection Time: 11/01/21 12:51 PM   Specimen: Bronchoalveolar Lavage  Result Value Ref Range   Viral Culture No virus isolated.     Comment: (NOTE) Performed At: [facility] [address] [place], [place] [id number] Rush Farmer MD BL:[id number] CORRECTED ON 11/21 AT 0935: PREVIOUSLY REPORTED AS Comment    Source of Sample BRONCHIAL ALVEOLAR LAVAGE     Comment: Performed at [facility], [address] [place], [place] [address]  Body fluid cell count with differential     Status: None   Collection Time: 11/01/21 12:52 PM  Result Value Ref Range   Fluid Type-FCT BRONCHIAL ALVEOLAR LAVAGE     Comment: Performed at [facility], [address] [place], [place] [address]   Color, Fluid COLORLESS    Appearance, Fluid CLEAR CLEAR   Total Nucleated Cell Count, Fluid 4 0 - 1,000 cu mm   Other Cells, Fluid TOO FEW TO COUNT, SMEAR AVAILABLE FOR REVIEW %    Comment: RARE LYMPH, NEUTROPHIL, AND LINING CELL SEEN. Performed at [facility], [place] [address] [place],  [address]     Radiology No results  found.  Assessment/Plan Hypertension blood pressure control important in reducing the progression of atherosclerotic disease. On appropriate oral medications.     Mixed hyperlipidemia lipid control important in reducing the progression of atherosclerotic disease. Continue statin therapy     Prediabetes blood glucose control important in reducing the progression of atherosclerotic disease. Also, involved in wound healing.   Cellulitis of leg, left Currently on antibiotics and has a marked area of stasis dermatitis with erythema in the left lower leg that is quite painful.  Were going to try to put him in a 3 layer Unna boot today.  This was placed on the left leg and we can change in 1 week.  I discussed that first, this may be more painful but I think it will help over the next 24 to 48  hours.  Varicose 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 The patient has known significant venous reflux in the left great saphenous vein with a second episode of cellulitis in that left leg as well as chronic pain and swelling despite appropriate use of compression socks, elevation, and activity.  He has been doing these for many months.  Clearly, he would benefit from laser ablation of the left great saphenous vein and we will try to get that approved ASAP.    Leotis Pain, MD  01/03/2022 11:16 AM    This note was created with Dragon medical transcription system.  Any errors from dictation are purely unintentional

## 2022-01-03 NOTE — Assessment & Plan Note (Signed)
Currently on antibiotics and has a marked area of stasis dermatitis with erythema in the left lower leg that is quite painful.  Were going to try to put him in a 3 layer Unna boot today.  This was placed on the left leg and we can change in 1 week.  I discussed that first, this may be more painful but I think it will help over the next 24 to 48 hours.

## 2022-01-03 NOTE — Assessment & Plan Note (Signed)
The patient has known significant venous reflux in the left great saphenous vein with a second episode of cellulitis in that left leg as well as chronic pain and swelling despite appropriate use of compression socks, elevation, and activity.  He has been doing these for many months.  Clearly, he would benefit from laser ablation of the left great saphenous vein and we will try to get that approved ASAP.

## 2022-01-07 ENCOUNTER — Encounter (INDEPENDENT_AMBULATORY_CARE_PROVIDER_SITE_OTHER): Payer: Self-pay | Admitting: Vascular Surgery

## 2022-01-07 NOTE — Telephone Encounter (Signed)
Patient came in Friday and was placed in left unna wrap. Patient informed he removed the wrap on Sunday. Patient sent pictures being concerned of area around his ankle and is currently on antibiotics. I spoke with Dr Lucky Cowboy and recommended that patient could come in for unna if tolerable. Patient is schedule to come tomorrow for left unna wrap with calamine.

## 2022-01-07 NOTE — Telephone Encounter (Signed)
Patient concerns has been address

## 2022-01-08 ENCOUNTER — Encounter (INDEPENDENT_AMBULATORY_CARE_PROVIDER_SITE_OTHER): Payer: 59

## 2022-01-09 ENCOUNTER — Other Ambulatory Visit: Payer: Self-pay

## 2022-01-09 ENCOUNTER — Ambulatory Visit: Payer: 59 | Admitting: Nurse Practitioner

## 2022-01-09 DIAGNOSIS — J45909 Unspecified asthma, uncomplicated: Secondary | ICD-10-CM | POA: Diagnosis not present

## 2022-01-09 NOTE — Progress Notes (Signed)
Daily Session Note  Patient Details  Name: Jonathon Snow MRN: [id number] Date of Birth: July 26, 1961 Referring Provider:   Flowsheet Row Pulmonary Rehab from 12/04/2021 in [facility] Cardiac and Pulmonary Rehab  Referring Provider [name] MD       Encounter Date: 01/09/2022  Check In:  Session Check In - 01/09/22 0754       Check-In   Supervising physician immediately available to respond to emergencies See telemetry face sheet for immediately available ER MD    Location [facility]-Cardiac & Pulmonary Rehab    Staff Present Birdie Sons, MPA, RN;Joseph Tessie Fass, [place]    Virtual Visit No    Medication changes reported     No    Fall or balance concerns reported    No    Warm-up and Cool-down Performed on first and last piece of equipment    Resistance Training Performed Yes    VAD Patient? No    PAD/SET Patient? No      Pain Assessment   Currently in Pain? No/denies                Social History   Tobacco Use  Smoking Status Never  Smokeless Tobacco Never    Goals Met:  Independence with exercise equipment Exercise tolerated well No report of concerns or symptoms today Strength training completed today  Goals Unmet:  Not Applicable  Comments: Pt able to follow exercise prescription today without complaint.  Pt has been out due to cellulitis in leg. He stated his leg is some better and is able to exercise today at a lower level. Will continue to monitor for progression.    Dr. Emily Filbert is Medical Director for [place].  Dr. [name] is Medical Director for [facility] Pulmonary Rehabilitation.

## 2022-01-10 ENCOUNTER — Encounter (INDEPENDENT_AMBULATORY_CARE_PROVIDER_SITE_OTHER): Payer: 59

## 2022-01-13 ENCOUNTER — Encounter: Payer: 59 | Admitting: *Deleted

## 2022-01-13 ENCOUNTER — Other Ambulatory Visit: Payer: Self-pay | Admitting: Orthopaedic Surgery

## 2022-01-13 ENCOUNTER — Other Ambulatory Visit: Payer: Self-pay

## 2022-01-13 ENCOUNTER — Ambulatory Visit: Payer: 59 | Admitting: Dermatology

## 2022-01-13 DIAGNOSIS — M545 Low back pain, unspecified: Secondary | ICD-10-CM

## 2022-01-13 DIAGNOSIS — J45909 Unspecified asthma, uncomplicated: Secondary | ICD-10-CM | POA: Diagnosis not present

## 2022-01-13 DIAGNOSIS — M47816 Spondylosis without myelopathy or radiculopathy, lumbar region: Secondary | ICD-10-CM

## 2022-01-13 NOTE — Progress Notes (Signed)
Daily Session Note  Patient Details  Name: Jonathon Snow MRN: [id number] Date of Birth: 09/24/1961 Referring Provider:   Flowsheet Row Pulmonary Rehab from 12/04/2021 in [facility] Cardiac and Pulmonary Rehab  Referring Provider [name] MD       Encounter Date: 01/13/2022  Check In:  Session Check In - 01/13/22 0806       Check-In   Supervising physician immediately available to respond to emergencies See telemetry face sheet for immediately available ER MD    Location [facility]-Cardiac & Pulmonary Rehab    Staff Present Heath Lark, RN, BSN, CCRP;Joseph [place], RCP,RRT,BSRT;Kelly [place], [place], ACSM CEP, Exercise Physiologist    Virtual Visit No    Medication changes reported     No    Fall or balance concerns reported    No    Warm-up and Cool-down Performed on first and last piece of equipment    Resistance Training Performed Yes    VAD Patient? No    PAD/SET Patient? No      Pain Assessment   Currently in Pain? No/denies                Social History   Tobacco Use  Smoking Status Never  Smokeless Tobacco Never    Goals Met:  Proper associated with RPD/PD & O2 Sat Independence with exercise equipment Exercise tolerated well No report of concerns or symptoms today  Goals Unmet:  Not Applicable  Comments: Pt able to follow exercise prescription today without complaint.  Will continue to monitor for progression.    Dr. Emily Filbert is Medical Director for [place].  Dr. [name] is Medical Director for [facility] Pulmonary Rehabilitation.

## 2022-01-14 ENCOUNTER — Other Ambulatory Visit: Payer: Self-pay

## 2022-01-14 ENCOUNTER — Encounter (INDEPENDENT_AMBULATORY_CARE_PROVIDER_SITE_OTHER): Payer: 59

## 2022-01-14 DIAGNOSIS — J45909 Unspecified asthma, uncomplicated: Secondary | ICD-10-CM | POA: Diagnosis not present

## 2022-01-14 NOTE — Progress Notes (Signed)
Daily Session Note  Patient Details  Name: Jonathon Snow MRN: [id number] Date of Birth: [birth year] Referring Provider:   Flowsheet Row Pulmonary Rehab from 12/04/2021 in [facility] Cardiac and Pulmonary Rehab  Referring Provider [name] MD       Encounter Date: 01/14/2022  Check In:  Session Check In - 01/14/22 0829       Check-In   Supervising physician immediately available to respond to emergencies See telemetry face sheet for immediately available ER MD    Location [facility]-Cardiac & Pulmonary Rehab    Staff Present Birdie Sons, MPA, RN;Jessica Luan Pulling, MA, RCEP, CCRP, CCET    Virtual Visit No    Medication changes reported     No    Fall or balance concerns reported    No    Warm-up and Cool-down Performed on first and last piece of equipment    Resistance Training Performed Yes    VAD Patient? No    PAD/SET Patient? No      Pain Assessment   Currently in Pain? No/denies                Social History   Tobacco Use  Smoking Status Never  Smokeless Tobacco Never    Goals Met:  Independence with exercise equipment Exercise tolerated well No report of concerns or symptoms today Strength training completed today  Goals Unmet:  Not Applicable  Comments: Pt able to follow exercise prescription today without complaint.  Will continue to monitor for progression.    Dr. Emily Filbert is Medical Director for [place].  Dr. [name] is Medical Director for [facility] Pulmonary Rehabilitation.

## 2022-01-15 ENCOUNTER — Encounter: Payer: Self-pay | Admitting: *Deleted

## 2022-01-15 DIAGNOSIS — J45909 Unspecified asthma, uncomplicated: Secondary | ICD-10-CM

## 2022-01-15 NOTE — Progress Notes (Signed)
Pulmonary Individual Treatment Plan  Patient Details  Name: Jonathon Snow MRN: [id number] Date of Birth: 1961/10/23 Referring Provider:   Flowsheet Row Pulmonary Rehab from 12/04/2021 in [facility] Cardiac and Pulmonary Rehab  Referring Provider [name] MD       Initial Encounter Date:  Flowsheet Row Pulmonary Rehab from 12/04/2021 in [facility] Cardiac and Pulmonary Rehab  Date 12/04/21       Visit Diagnosis: Asthma, unspecified asthma severity, unspecified whether complicated, unspecified whether persistent  Patient's Home Medications on Admission:  Current Outpatient Medications:    albuterol (VENTOLIN HFA) 108 (90 Base) MCG/ACT inhaler, Inhale 2 puffs into the lungs every 6 (six) hours as needed for wheezing or shortness of breath., Disp: 18 g, Rfl: 2   amLODipine (NORVASC) 5 MG tablet, Take 1 tablet (5 mg total) by mouth daily., Disp: 90 tablet, Rfl: 3   apixaban (ELIQUIS) 5 MG TABS tablet, Take 1 tablet (5 mg total) by mouth 2 (two) times daily., Disp: 60 tablet, Rfl: 2   cholecalciferol (VITAMIN D3) 25 MCG (1000 UNIT) tablet, Take 1,000 Units by mouth daily., Disp: , Rfl:    COVID-19 mRNA bivalent vaccine, Pfizer, (PFIZER COVID-19 VAC BIVALENT) injection, Inject into the muscle., Disp: 0.3 mL, Rfl: 0   doxycycline (VIBRA-TABS) 100 MG tablet, Take 1 tablet (100 mg total) by mouth 2 (two) times daily., Disp: 14 tablet, Rfl: 0   ferrous sulfate 325 (65 FE) MG tablet, Take 325 mg by mouth daily with breakfast., Disp: , Rfl:    ipratropium-albuterol (DUONEB) 0.5-2.5 (3) MG/3ML SOLN, Take 3 mLs by nebulization every 4 (four) hours as needed., Disp: 360 mL, Rfl: 3   losartan (COZAAR) 100 MG tablet, Take 1 tablet (100 mg total) by mouth daily., Disp: 90 tablet, Rfl: 0   montelukast (SINGULAIR) 10 MG tablet, Take 1 tablet (10 mg total) by mouth at bedtime., Disp: 90 tablet, Rfl: 3   Multiple Vitamins-Minerals (MULTIVITAMIN WITH MINERALS) tablet, Take 1 tablet by mouth daily., Disp: ,  Rfl:    mupirocin ointment (BACTROBAN) 2 %, Apply 1 application topically daily. Until healed at bottom of right foot, Disp: 22 g, Rfl: 0   mycophenolate (CELLCEPT) 500 MG tablet, Take 1,000 mg by mouth 2 (two) times daily., Disp: , Rfl:    nystatin-triamcinolone ointment (MYCOLOG), Apply 1 application topically 2 (two) times daily., Disp: 30 g, Rfl: 0   oxybutynin (DITROPAN XL) 15 MG 24 hr tablet, Take 1 tablet (15 mg total) by mouth daily., Disp: 30 tablet, Rfl: 11   predniSONE (DELTASONE) 10 MG tablet, Take 10 mg by mouth daily with breakfast., Disp: , Rfl:    rosuvastatin (CRESTOR) 20 MG tablet, Take one pill every other night before bed. (Patient taking differently: Take 20 mg by mouth daily.), Disp: 45 tablet, Rfl: 0   sertraline (ZOLOFT) 25 MG tablet, Take 1 tablet (25 mg total) by mouth daily., Disp: 30 tablet, Rfl: 0   sildenafil (REVATIO) 20 MG tablet, 3-5 tablets as needed 30 min prior to intercourse, Disp: 30 tablet, Rfl: 11   tamsulosin (FLOMAX) 0.4 MG CAPS capsule, Take 1 capsule (0.4 mg total) by mouth daily., Disp: 30 capsule, Rfl: 11   traMADol (ULTRAM) 50 MG tablet, Take 1 tablet (50 mg total) by mouth every 6 (six) hours as needed., Disp: 20 tablet, Rfl: 0  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   Overview:  superior sagittal sinus, left  transverse sinus and cortical veins    COVID-19 virus infection 12/2020   Depression    DVT (deep venous thrombosis) (HCC)    Dyspnea    GERD (gastroesophageal reflux disease)    Heel spur, left 02/16/2019   Heel spur, right 02/16/2019   Hyperlipidemia    Hypertension    Lupus ([place])    Lymphedema 10/07/2018   Morbid obesity ([place])    Opiate abuse, episodic ([place]) 02/26/2018   Osteoporosis    Pneumonia    PONV (postoperative nausea and vomiting)    Postphlebitic syndrome with ulcer, 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   Presence of IVC filter  03/22/2020   Removed   Pulmonary embolism ([place])    Renal disorder    Stage III    Tobacco Use: Social History   Tobacco Use  Smoking Status Never  Smokeless Tobacco Never    Labs: Recent Review Flowsheet Data     Labs for ITP Cardiac and Pulmonary Rehab Latest Ref Rng & Units 02/15/2019 01/17/2020 11/07/2020 03/19/2021 09/18/2021   Cholestrol <200 mg/dL 244(H) 227(H) 275(H) - 139   LDLCALC mg/dL (calc) 169(H) 159(H) 196(H) - 76   HDL > OR = 40 mg/dL 50 46 55 - 45   Trlycerides <150 mg/dL 119 108 107 - 92   Hemoglobin A1c <5.7 % of total Hgb - - 5.8(H) 5.8(H) 5.7(H)   PHART 7.350 - 7.450 - - - - -   PCO2ART 32.0 - 48.0 mmHg - - - - -   HCO3 20.0 - 28.0 mmol/L - - - - -   TCO2 0 - 100 mmol/L - - - - -   ACIDBASEDEF 0.0 - 2.0 mmol/L - - - - -   O2SAT % - - - - -        Pulmonary Assessment Scores:  Pulmonary Assessment Scores     Row Name 12/04/21 1237         ADL UCSD   ADL Phase Entry     SOB Score total 79     Rest 1     Walk 5     Stairs 5     Bath 2     Dress 2     Shop 3       CAT Score   CAT Score 20       mMRC Score   mMRC Score 3              UCSD: Self-administered rating of dyspnea associated with activities of daily living (ADLs) 6-point scale (0 = "not at all" to 5 = "maximal or unable to do because of breathlessness")  Scoring Scores range from 0 to 120.  Minimally important difference is 5 units  CAT: CAT can identify the health impairment of COPD patients and is better correlated with disease progression.  CAT has a scoring range of zero to 40. The CAT score is classified into four groups of low (less than 10), medium (10 - 20), high (21-30) and very high (31-40) based on the impact level of disease on health status. A CAT score over 10 suggests significant symptoms.  A worsening CAT score could be explained by an exacerbation, poor medication adherence, poor inhaler technique, or progression of COPD or comorbid conditions.  CAT MCID is  2 points  mMRC: mMRC (Modified Medical Research Council) Dyspnea Scale is used to assess the degree of baseline functional disability in patients of respiratory disease due to dyspnea. No minimal important difference is established. A  decrease in score of 1 point or greater is considered a positive change.   Pulmonary Function Assessment:  Pulmonary Function Assessment - 10/23/21 1538       Breath   Shortness of Breath Yes;Limiting activity;Panic with Shortness of Breath             Exercise Target Goals: Exercise Program Goal: Individual exercise prescription set using results from initial 6 min walk test and THRR while considering  patients activity barriers and safety.   Exercise Prescription Goal: Initial exercise prescription builds to 30-45 minutes a day of aerobic activity, 2-3 days per week.  Home exercise guidelines will be given to patient during program as part of exercise prescription that the participant will acknowledge.  Education: Aerobic Exercise: - Group verbal and visual presentation on the components of exercise prescription. Introduces F.I.T.T principle from ACSM for exercise prescriptions.  Reviews F.I.T.T. principles of aerobic exercise including progression. Written material given at graduation. Flowsheet Row Pulmonary Rehab from 01/09/2022 in [facility] Cardiac and Pulmonary Rehab  Date 12/19/21  Educator [facility]  Instruction Review Code 1- Verbalizes Understanding       Education: Resistance Exercise: - Group verbal and visual presentation on the components of exercise prescription. Introduces F.I.T.T principle from ACSM for exercise prescriptions  Reviews F.I.T.T. principles of resistance exercise including progression. Written material given at graduation. Flowsheet Row Pulmonary Rehab from 01/09/2022 in [facility] Cardiac and Pulmonary Rehab  Date 12/26/21  Educator [facility]  Instruction Review Code 1- Verbalizes Understanding        Education: Exercise & Equipment  Safety: - Individual verbal instruction and demonstration of equipment use and safety with use of the equipment. Flowsheet Row Pulmonary Rehab from 10/23/2021 in [facility] Cardiac and Pulmonary Rehab  Date 10/23/21  Educator [facility]  Instruction Review Code 1- Verbalizes Understanding       Education: Exercise Physiology & General Exercise Guidelines: - Group verbal and written instruction with models to review the exercise physiology of the cardiovascular system and associated critical values. Provides general exercise guidelines with specific guidelines to those with heart or lung disease.  Flowsheet Row Pulmonary Rehab from 12/04/2021 in [facility] Cardiac and Pulmonary Rehab  Education need identified 12/04/21       Education: Flexibility, Balance, Mind/Body Relaxation: - Group verbal and visual presentation with interactive activity on the components of exercise prescription. Introduces F.I.T.T principle from ACSM for exercise prescriptions. Reviews F.I.T.T. principles of flexibility and balance exercise training including progression. Also discusses the mind body connection.  Reviews various relaxation techniques to help reduce and manage stress (i.e. Deep breathing, progressive muscle relaxation, and visualization). Balance handout provided to take home. Written material given at graduation.   Activity Barriers & Risk Stratification:  Activity Barriers & Cardiac Risk Stratification - 12/04/21 1245       Activity Barriers & Cardiac Risk Stratification   Activity Barriers Joint Problems;Deconditioning;Muscular Weakness;Back Problems;Shortness of Breath;Other (comment)    Comments Lupus             6 Minute Walk:  6 Minute Walk     Row Name 12/04/21 1246         6 Minute Walk   Phase Initial     Distance 1060 feet     Walk Time 6 minutes     # of Rest Breaks 0     MPH 2     METS 3.37     RPE 11     Perceived Dyspnea  3     VO2 Peak  11.82     Symptoms Yes (comment)      Comments SOB, fatigue     Resting HR 93 bpm     Resting BP 124/72     Resting Oxygen Saturation  98 %     Exercise Oxygen Saturation  during 6 min walk 95 %     Max Ex. HR 118 bpm     Max Ex. BP 154/80     2 Minute Post BP 128/76       Interval HR   1 Minute HR 106     2 Minute HR 108     3 Minute HR 113     4 Minute HR 118     5 Minute HR 114     6 Minute HR 115     2 Minute Post HR 94     Interval Heart Rate? Yes       Interval Oxygen   Interval Oxygen? Yes     Baseline Oxygen Saturation % 98 %     1 Minute Oxygen Saturation % 96 %     1 Minute Liters of Oxygen 0 L  RA     2 Minute Oxygen Saturation % 95 %     2 Minute Liters of Oxygen 0 L     3 Minute Oxygen Saturation % 95 %     3 Minute Liters of Oxygen 0 L     4 Minute Oxygen Saturation % 97 %     4 Minute Liters of Oxygen 0 L     5 Minute Oxygen Saturation % 96 %     5 Minute Liters of Oxygen 0 L     6 Minute Oxygen Saturation % 97 %     6 Minute Liters of Oxygen 0 L     2 Minute Post Oxygen Saturation % 97 %     2 Minute Post Liters of Oxygen 0 L             Oxygen Initial Assessment:  Oxygen Initial Assessment - 12/04/21 1237       Home Oxygen   Home Oxygen Device None    Sleep Oxygen Prescription None    Home Exercise Oxygen Prescription None    Home Resting Oxygen Prescription None    Compliance with Home Oxygen Use Yes      Initial 6 min Walk   Oxygen Used None      Program Oxygen Prescription   Program Oxygen Prescription None      Intervention   Short Term Goals To learn and exhibit compliance with exercise, home and travel O2 prescription;To learn and understand importance of monitoring SPO2 with pulse oximeter and demonstrate accurate use of the pulse oximeter.;To learn and understand importance of maintaining oxygen saturations>88%;To learn and demonstrate proper pursed lip breathing techniques or other breathing techniques. ;To learn and demonstrate proper use of respiratory medications     Long  Term Goals Exhibits compliance with exercise, home  and travel O2 prescription;Verbalizes importance of monitoring SPO2 with pulse oximeter and return demonstration;Maintenance of O2 saturations>88%;Exhibits proper breathing techniques, such as pursed lip breathing or other method taught during program session;Compliance with respiratory medication;Demonstrates proper use of MDIs             Oxygen Re-Evaluation:  Oxygen Re-Evaluation     Row Name 12/17/21 1610 12/30/21 0911           Program Oxygen Prescription   Program Oxygen Prescription None None  Home Oxygen   Home Oxygen Device None None      Sleep Oxygen Prescription None None      Home Exercise Oxygen Prescription None None      Home Resting Oxygen Prescription None None      Compliance with Home Oxygen Use Yes --        Goals/Expected Outcomes   Short Term Goals To learn and demonstrate proper pursed lip breathing techniques or other breathing techniques. ;To learn and demonstrate proper use of respiratory medications To learn and demonstrate proper pursed lip breathing techniques or other breathing techniques. ;To learn and demonstrate proper use of respiratory medications;To learn and understand importance of maintaining oxygen saturations>88%;To learn and understand importance of monitoring SPO2 with pulse oximeter and demonstrate accurate use of the pulse oximeter.      Long  Term Goals Exhibits proper breathing techniques, such as pursed lip breathing or other method taught during program session;Compliance with respiratory medication;Demonstrates proper use of MDIs Exhibits proper breathing techniques, such as pursed lip breathing or other method taught during program session;Compliance with respiratory medication;Demonstrates proper use of MDIs;Verbalizes importance of monitoring SPO2 with pulse oximeter and return demonstration;Maintenance of O2 saturations>88%      Comments Reviewed PLB technique with  pt.  Talked about how it works and it's importance in maintaining their exercise saturations.      Short: Become more profiecient at using PLB.   Long: Become independent at using PLB. Calyn is doing well in rehab. He is using his PLB and finds it helpful, but still having some SOB.  He is doing well on his medicaitons.  His saturations are staying up.  He will continue to exercise to help with breathing overall.      Goals/Expected Outcomes Short: Become more profiecient at using PLB.   Long: Become independent at using PLB. Short: Continue to exercise for breathing benefits Long: continue to use PLB to help manage SOB               Oxygen Discharge (Final Oxygen Re-Evaluation):  Oxygen Re-Evaluation - 12/30/21 0911       Program Oxygen Prescription   Program Oxygen Prescription None      Home Oxygen   Home Oxygen Device None    Sleep Oxygen Prescription None    Home Exercise Oxygen Prescription None    Home Resting Oxygen Prescription None      Goals/Expected Outcomes   Short Term Goals To learn and demonstrate proper pursed lip breathing techniques or other breathing techniques. ;To learn and demonstrate proper use of respiratory medications;To learn and understand importance of maintaining oxygen saturations>88%;To learn and understand importance of monitoring SPO2 with pulse oximeter and demonstrate accurate use of the pulse oximeter.    Long  Term Goals Exhibits proper breathing techniques, such as pursed lip breathing or other method taught during program session;Compliance with respiratory medication;Demonstrates proper use of MDIs;Verbalizes importance of monitoring SPO2 with pulse oximeter and return demonstration;Maintenance of O2 saturations>88%    Comments Monica is doing well in rehab. He is using his PLB and finds it helpful, but still having some SOB.  He is doing well on his medicaitons.  His saturations are staying up.  He will continue to exercise to help with breathing  overall.    Goals/Expected Outcomes Short: Continue to exercise for breathing benefits Long: continue to use PLB to help manage SOB             Initial Exercise Prescription:  Initial Exercise Prescription - 12/04/21 1200       Date of Initial Exercise RX and Referring Provider   Date 12/04/21    Referring Provider [name] MD      Oxygen   Maintain Oxygen Saturation 88% or higher      Treadmill   MPH 1.8    Grade 0    Minutes 15    METs 2.38      Recumbant Bike   Level 1    RPM 60    Watts 30    Minutes 15    METs 3.3      NuStep   Level 1    SPM 80    Minutes 15    METs 3.3      Prescription Details   Frequency (times per week) 2    Duration Progress to 30 minutes of continuous aerobic without signs/symptoms of physical distress      Intensity   THRR 40-80% of Max Heartrate 120-147    Ratings of Perceived Exertion 11-13    Perceived Dyspnea 0-4      Progression   Progression Continue to progress workloads to maintain intensity without signs/symptoms of physical distress.      Resistance Training   Training Prescription Yes    Weight 3 lb    Reps 10-15             Perform Capillary Blood Glucose checks as needed.  Exercise Prescription Changes:   Exercise Prescription Changes     Row Name 12/04/21 1200 12/24/21 0900 01/08/22 1200         Response to Exercise   Blood Pressure (Admit) 124/72 148/74 126/64     Blood Pressure (Exercise) 154/80 178/64 122/66     Blood Pressure (Exit) 128/76 106/54 122/70     Heart Rate (Admit) 93 bpm 104 bpm 110 bpm     Heart Rate (Exercise) 118 bpm 130 bpm 132 bpm     Heart Rate (Exit) 94 bpm 112 bpm 106 bpm     Oxygen Saturation (Admit) 98 % 95 % 97 %     Oxygen Saturation (Exercise) 95 % 95 % 99 %     Oxygen Saturation (Exit) 97 % 95 % 97 %     Rating of Perceived Exertion (Exercise) _0 Perceived Dyspnea (Exercise) _1 Symptoms SOB, fatigue SOB SOB     Comments walk test  results 2nd full session --     Duration -- Progress to 30 minutes of  aerobic without signs/symptoms of physical distress Progress to 30 minutes of  aerobic without signs/symptoms of physical distress     Intensity -- THRR unchanged THRR unchanged       Progression   Progression -- Continue to progress workloads to maintain intensity without signs/symptoms of physical distress. Continue to progress workloads to maintain intensity without signs/symptoms of physical distress.     Average METs -- 3.2 2.75       Resistance Training   Training Prescription -- Yes Yes     Weight -- 3 lb 3 lb     Reps -- 10-15 10-15       Interval Training   Interval Training -- No No       Treadmill   MPH -- 1.8 2     Grade -- 0 0     Minutes -- 15 15     METs -- 2.38 2.53  Recumbant Bike   Level -- 3 --     Minutes -- 15 --     METs -- 2.73 --       NuStep   Level -- 5 3     Minutes -- 15 15     METs -- 4.4 3       Oxygen   Maintain Oxygen Saturation -- 88% or higher 88% or higher              Exercise Comments:   Exercise Comments     Row Name 12/17/21 0820 01/09/22 0756         Exercise Comments First full day of exercise!  Patient was oriented to gym and equipment including functions, settings, policies, and procedures.  Patient's individual exercise prescription and treatment plan were reviewed.  All starting workloads were established based on the results of the 6 minute walk test done at initial orientation visit.  The plan for exercise progression was also introduced and progression will be customized based on patient's performance and goals. Pt able to follow exercise prescription today without complaint.  Pt has been out due to cellulitis in leg. He stated his leg is some better and is able to exercise today at a lower level. Will continue to monitor for progression.               Exercise Goals and Review:   Exercise Goals     Row Name 12/04/21 1255              Exercise Goals   Increase Physical Activity Yes       Intervention Provide advice, education, support and counseling about physical activity/exercise needs.;Develop an individualized exercise prescription for aerobic and resistive training based on initial evaluation findings, risk stratification, comorbidities and participant's personal goals.       Expected Outcomes Short Term: Attend rehab on a regular basis to increase amount of physical activity.;Long Term: Add in home exercise to make exercise part of routine and to increase amount of physical activity.;Long Term: Exercising regularly at least 3-5 days a week.       Increase Strength and Stamina Yes       Intervention Provide advice, education, support and counseling about physical activity/exercise needs.;Develop an individualized exercise prescription for aerobic and resistive training based on initial evaluation findings, risk stratification, comorbidities and participant's personal goals.       Expected Outcomes Short Term: Increase workloads from initial exercise prescription for resistance, speed, and METs.;Short Term: Perform resistance training exercises routinely during rehab and add in resistance training at home;Long Term: Improve cardiorespiratory fitness, muscular endurance and strength as measured by increased METs and functional capacity (6MWT)       Able to understand and use rate of perceived exertion (RPE) scale Yes       Intervention Provide education and explanation on how to use RPE scale       Expected Outcomes Short Term: Able to use RPE daily in rehab to express subjective intensity level;Long Term:  Able to use RPE to guide intensity level when exercising independently       Able to understand and use Dyspnea scale Yes       Intervention Provide education and explanation on how to use Dyspnea scale       Expected Outcomes Short Term: Able to use Dyspnea scale daily in rehab to express subjective sense of shortness of  breath during exertion;Long Term: Able to use Dyspnea scale to  guide intensity level when exercising independently       Knowledge and understanding of Target Heart Rate Range (THRR) Yes       Intervention Provide education and explanation of THRR including how the numbers were predicted and where they are located for reference       Expected Outcomes Short Term: Able to state/look up THRR;Long Term: Able to use THRR to govern intensity when exercising independently;Short Term: Able to use daily as guideline for intensity in rehab       Able to check pulse independently Yes       Intervention Provide education and demonstration on how to check pulse in carotid and radial arteries.;Review the importance of being able to check your own pulse for safety during independent exercise       Expected Outcomes Short Term: Able to explain why pulse checking is important during independent exercise;Long Term: Able to check pulse independently and accurately       Understanding of Exercise Prescription Yes       Intervention Provide education, explanation, and written materials on patient's individual exercise prescription       Expected Outcomes Short Term: Able to explain program exercise prescription;Long Term: Able to explain home exercise prescription to exercise independently                Exercise Goals Re-Evaluation :  Exercise Goals Re-Evaluation     Row Name 12/17/21 2505 12/24/21 0913 12/30/21 0845 01/08/22 1236       Exercise Goal Re-Evaluation   Exercise Goals Review Able to understand and use rate of perceived exertion (RPE) scale;Able to understand and use Dyspnea scale;Knowledge and understanding of Target Heart Rate Range (THRR);Understanding of Exercise Prescription Increase Physical Activity;Increase Strength and Stamina Increase Physical Activity;Increase Strength and Stamina;Able to understand and use rate of perceived exertion (RPE) scale;Able to understand and use Dyspnea  scale;Knowledge and understanding of Target Heart Rate Range (THRR);Able to check pulse independently;Understanding of Exercise Prescription Increase Physical Activity;Increase Strength and Stamina    Comments Reviewed RPE and dyspnea scales, THR and program prescription with pt today.  Pt voiced understanding and was given a copy of goals to take home. Sim is doing well his first couple of sessions here in rehab. He has already increased to level 5 on the T4 Nustep and tolerating the rest of his exercise prescription. Will continue to monitor as he progresses throughout the program. Serge has been doing well in rehab and has even added in a third day.  He is already walking at home on his off days.  Reviewed home exercise with pt today.  Pt plans to continue walking at home and add in our staff videos for exercise.  Reviewed THR, pulse, RPE, sign and symptoms, pulse oximetery and when to call 911 or MD.  Also discussed weather considerations and indoor options.  Pt voiced understanding. Norma reaches [facility] during sessions.  He has missed some due to cellulitis in his leg.  We will continue to monitor progress.    Expected Outcomes Short: Use RPE daily to regulate intensity. Long: Follow program prescription in THR. Short: Continue to build up tolerance on the treadmill Long: Continue to increase overall MET level Short: Start to add in videos on bad weather days Long: Continue to improe stamina. Short: get back to regular attendance Long: improve average MET level             Discharge Exercise Prescription (Final Exercise Prescription Changes):  Exercise Prescription  Changes - 01/08/22 1200       Response to Exercise   Blood Pressure (Admit) 126/64    Blood Pressure (Exercise) 122/66    Blood Pressure (Exit) 122/70    Heart Rate (Admit) 110 bpm    Heart Rate (Exercise) 132 bpm    Heart Rate (Exit) 106 bpm    Oxygen Saturation (Admit) 97 %    Oxygen Saturation (Exercise) 99 %    Oxygen  Saturation (Exit) 97 %    Rating of Perceived Exertion (Exercise) 13    Perceived Dyspnea (Exercise) 2    Symptoms SOB    Duration Progress to 30 minutes of  aerobic without signs/symptoms of physical distress    Intensity THRR unchanged      Progression   Progression Continue to progress workloads to maintain intensity without signs/symptoms of physical distress.    Average METs 2.75      Resistance Training   Training Prescription Yes    Weight 3 lb    Reps 10-15      Interval Training   Interval Training No      Treadmill   MPH 2    Grade 0    Minutes 15    METs 2.53      NuStep   Level 3    Minutes 15    METs 3      Oxygen   Maintain Oxygen Saturation 88% or higher             Nutrition:  Target Goals: Understanding of nutrition guidelines, daily intake of sodium <1556m, cholesterol <2016m calories 30% from fat and 7% or less from saturated fats, daily to have 5 or more servings of fruits and vegetables.  Education: All About Nutrition: -Group instruction provided by verbal, written material, interactive activities, discussions, models, and posters to present general guidelines for heart healthy nutrition including fat, fiber, MyPlate, the role of sodium in heart healthy nutrition, utilization of the nutrition label, and utilization of this knowledge for meal planning. Follow up email sent as well. Written material given at graduation.   Biometrics:  Pre Biometrics - 12/04/21 1245       Pre Biometrics   Height 6' 2.5" (1.892 m)    Weight 238 lb 3.2 oz (108 kg)    BMI (Calculated) 30.18    Single Leg Stand --   declined             Nutrition Therapy Plan and Nutrition Goals:  Nutrition Therapy & Goals - 12/31/21 0843       Nutrition Therapy   Diet Heart healthy, low Na, pulmonary MNT    Drug/Food Interactions Statins/Certain Fruits    Protein (specify units) 100-110g    Fiber 30 grams    Whole Grain Foods 3 servings    Saturated Fats 16  max. grams    Fruits and Vegetables 8 servings/day    Sodium 2 grams      Personal Nutrition Goals   Nutrition Goal ST: prepping his chicken beforehand or cutting vegetables as this is time consuming and can be more versatile, prepping and freezing his breakfast  LT: prepare meals more efficiently to reduce fatigue, limit Na <2g/day, limit saturated fat <16g/day    Comments [age].o. M admitted to pulmonary rehab for asthma also presenting with COPD, HTN, stg 3 CKD, osteoporosis, lupus. Relevant medications include vit D, iron, MVI, prednisone, crestor, zoloft, cellcept. PYP 44.  Vegetables & Fruits 6/12. Breads, Grains & Cereals 6/12. Red &  Processed Meat 4/12. Poultry 2/2. Fish & Shellfish 1/4. Beans, Nuts & Seeds 3/4. Milk & Dairy Foods 3/6. Toppings, Oils, Seasonings & Salt 6/20. Sweets, Snacks & Restaurant Food 5/14. Beverages 8/10. B: sometimes: bagel sandwich or cream cheese or breakfast sandwich. L: vaires: tries for a bigger meal at lunch. Soup and sandwich or just soup or sandwich D: Protein and vegetables (mostly starchy vegetables). Drinks: "too much soda" - diet Snacks: m&ms, chips, crackers with peanut butter. Torrie reports his diet is not the best and that he should make changes like not having sausage so often. Kayl feels that chocolate and soda are his weaknessess. He feels his major barrier is his energy. His good days he is a 6 or 7,, but it is rare (maybe once every couple of weeks). Normally he is at a 1-2 for energy. . Discussed heart healthy eating and pulmonary MNT. Discussed ingredient prepping - he has tried meal prepping and freezer meals before but it just went to waste. Suggested starting slowly with prepping his chicken beforehand or cutting vegetables as this is time consuming and can be more versatile to prevent food waste. Also suggested prepping his breakfast and freezing it week to week as he eats close to the same thing daily.      Intervention Plan   Intervention  Prescribe, educate and counsel regarding individualized specific dietary modifications aiming towards targeted core components such as weight, hypertension, lipid management, diabetes, heart failure and other comorbidities.    Expected Outcomes Short Term Goal: Understand basic principles of dietary content, such as calories, fat, sodium, cholesterol and nutrients.;Short Term Goal: A plan has been developed with personal nutrition goals set during dietitian appointment.;Long Term Goal: Adherence to prescribed nutrition plan.             Nutrition Assessments:  MEDIFICTS Score Key: ?70 Need to make dietary changes  40-70 Heart Healthy Diet ? 40 Therapeutic Level Cholesterol Diet  Flowsheet Row Pulmonary Rehab [facility]04/[facility] Card[facility]  Picture Your Plate Total Score on Admission 44      Picture Your Plate Scores: <29 Unhealthy dietary pattern with much room for improvement. 41-50 Dietary pattern unlikely to meet recommendations for good health and room for improvement. 51-60 More healthful dietary pattern, with some room for improvement.  >60 Healthy dietary pattern, although there may be some specific behaviors that could be improved.   Nutrition Goals Re-Evaluation:  Nutrition Goals Re-Evaluation     [place] Name 12/30/21 0852             Goals   Nutrition Goal Need nutrition appt       Comment Dacen needs to schedule appt with dietitian       Expected Outcome Meet with dietitian                Nutrition Goals Discharge (Final Nutrition Goals Re-Evaluation):  Nutrition Goals Re-Evaluation - 12/30/21 0852       Goals   Nutrition Goal Need nutrition appt    Comment Cord needs to schedule appt with dietitian    Expected Outcome Meet with dietitian             Psychosocial: Target Goals: Acknowledge presence or absence of significant depression and/or stress, maximize coping skills, provide positive support system. Participant is able  to verbalize types and ability to use techniques and skills needed for reducing stress and depression.   Education: Stress, Anxiety, and Depression - Group verbal and visual presentation  to define topics covered.  Reviews how body is impacted by stress, anxiety, and depression.  Also discusses healthy ways to reduce stress and to treat/manage anxiety and depression.  Written material given at graduation.   Education: Sleep Hygiene -Provides group verbal and written instruction about how sleep can affect your health.  Define sleep hygiene, discuss sleep cycles and impact of sleep habits. Review good sleep hygiene tips.    Initial Review & Psychosocial Screening:  Initial Psych Review & Screening - 10/23/21 1539       Initial Review   Current issues with Current Psychotropic Meds;Current Sleep Concerns;Current Anxiety/Panic      Family Dynamics   Good Support System? No    Concerns No support system    Comments His family has mostly moved away and he does not have a good support system.      Barriers   Psychosocial barriers to participate in program The patient should benefit from training in stress management and relaxation.;There are no identifiable barriers or psychosocial needs.      Screening Interventions   Interventions Encouraged to exercise;To provide support and resources with identified psychosocial needs;Provide feedback about the scores to participant    Expected Outcomes Short Term goal: Utilizing psychosocial counselor, staff and physician to assist with identification of specific Stressors or current issues interfering with healing process. Setting desired goal for each stressor or current issue identified.;Long Term Goal: Stressors or current issues are controlled or eliminated.;Short Term goal: Identification and review with participant of any Quality of Life or Depression concerns found by scoring the questionnaire.;Long Term goal: The participant improves quality of Life  and PHQ9 Scores as seen by post scores and/or verbalization of changes             Quality of Life Scores:  Scores of 19 and below usually indicate a poorer quality of life in these areas.  A difference of  2-3 points is a clinically meaningful difference.  A difference of 2-3 points in the total score of the Quality of Life Index has been associated with significant improvement in overall quality of life, self-image, physical symptoms, and general health in studies assessing change in quality of life.  PHQ-9: Recent Review Flowsheet Data     Depression screen [facility] 2/9 12/30/2021 12/04/2021 10/07/2021 10/02/2021 09/18/2021   Decreased Interest 0 1 0 1 2   Down, Depressed, Hopeless 0 1 0 1 2   PHQ - 2 Score 0 2 0 2 4   Altered sleeping 1 2 0 2 2   Tired, decreased energy 3 3 0 3 2   Change in appetite 0 3 0 2 1   Feeling bad or failure about yourself  0 0 0 2 1   Trouble concentrating 1 1 0 2 3   Moving slowly or fidgety/restless 0 1 0 1 2   Suicidal thoughts 0 0 0 0 0   PHQ-9 Score 5 12 0 14 15   Difficult doing work/chores Somewhat difficult Somewhat difficult Not difficult at all Somewhat difficult Very difficult      Interpretation of Total Score  Total Score Depression Severity:  1-4 = Minimal depression, 5-9 = Mild depression, 10-14 = Moderate depression, 15-19 = Moderately severe depression, 20-27 = Severe depression   Psychosocial Evaluation and Intervention:  Psychosocial Evaluation - 10/23/21 1541       Psychosocial Evaluation & Interventions   Interventions Encouraged to exercise with the program and follow exercise prescription;Relaxation education;Stress management education  Comments His family has mostly moved away and he does not have a good support system.    Expected Outcomes Short: Start [facility] to help with mood. Long: Maintain a healthy mental state.    Continue Psychosocial Services  Follow up required by staff             Psychosocial  Re-Evaluation:  Psychosocial Re-Evaluation     [place] Name 12/30/21 [phone]             Psychosocial Re-Evaluation   Current issues with Current Psychotropic Meds;Current Sleep Concerns;Current Stress Concerns;Current Depression       Comments Reviewed patient health questionnaire (PHQ-9) with patient for follow up. Previously, patients score indicated signs/symptoms of depression.  Reviewed to see if patient is improving symptom wise while in program.  Score improved2 and patient states that it is because he has been moving more and feeling better.  His biggest source of stress is his healthy and lupus flare ups.  Besides his lupus keeping him up, he also has a difficult going to sleep at night.  One of his biggest complaints is that he is not sleeping well so that he is fatigued a lot. Given his sleep issues, we sent him some sleep information to review and watch.       Expected Outcomes Short: Continue to exericse for mental boost and review sleep info Long; Conitnue to manage lupus       Interventions Encouraged to attend Pulmonary Rehabilitation for the exercise;Stress management education       Continue Psychosocial Services  Follow up required by staff                Psychosocial Discharge (Final Psychosocial Re-Evaluation):  Psychosocial Re-Evaluation - 12/30/21 0849       Psychosocial Re-Evaluation   Current issues with Current Psychotropic Meds;Current Sleep Concerns;Current Stress Concerns;Current Depression    Comments Reviewed patient health questionnaire (PHQ-9) with patient for follow up. Previously, patients score indicated signs/symptoms of depression.  Reviewed to see if patient is improving symptom wise while in program.  Score improved2 and patient states that it is because he has been moving more and feeling better.  His biggest source of stress is his healthy and lupus flare ups.  Besides his lupus keeping him up, he also has a difficult going to sleep at night.  One of  his biggest complaints is that he is not sleeping well so that he is fatigued a lot. Given his sleep issues, we sent him some sleep information to review and watch.    Expected Outcomes Short: Continue to exericse for mental boost and review sleep info Long; Conitnue to manage lupus    Interventions Encouraged to attend Pulmonary Rehabilitation for the exercise;Stress management education    Continue Psychosocial Services  Follow up required by staff             Education: Education Goals: Education classes will be provided on a weekly basis, covering required topics. Participant will state understanding/return demonstration of topics presented.  Learning Barriers/Preferences:  Learning Barriers/Preferences - 10/23/21 1538       Learning Barriers/Preferences   Learning Barriers None    Learning Preferences None             General Pulmonary Education Topics:  Infection Prevention: - Provides verbal and written material to individual with discussion of infection control including proper hand washing and proper equipment cleaning during exercise session. Flowsheet Row Pulmonary Rehab from 10/23/2021 in [facility] Cardiac  and Pulmonary Rehab  Date 10/23/21  Educator [facility]  Instruction Review Code 1- Verbalizes Understanding       Falls Prevention: - Provides verbal and written material to individual with discussion of falls prevention and safety. Flowsheet Row Pulmonary Rehab from 10/23/2021 in [facility] Cardiac and Pulmonary Rehab  Date 10/23/21  Educator [facility]  Instruction Review Code 1- Verbalizes Understanding       Chronic Lung Disease Review: - Group verbal instruction with posters, models, PowerPoint presentations and videos,  to review new updates, new respiratory medications, new advancements in procedures and treatments. Providing information on websites and "800" numbers for continued self-education. Includes information about supplement oxygen, available portable oxygen  systems, continuous and intermittent flow rates, oxygen safety, concentrators, and Medicare reimbursement for oxygen. Explanation of Pulmonary Drugs, including class, frequency, complications, importance of spacers, rinsing mouth after steroid MDI's, and proper cleaning methods for nebulizers. Review of basic lung anatomy and physiology related to function, structure, and complications of lung disease. Review of risk factors. Discussion about methods for diagnosing sleep apnea and types of masks and machines for OSA. Includes a review of the use of types of environmental controls: home humidity, furnaces, filters, dust mite/pet prevention, HEPA vacuums. Discussion about weather changes, air quality and the benefits of nasal washing. Instruction on Warning signs, infection symptoms, calling MD promptly, preventive modes, and value of vaccinations. Review of effective airway clearance, coughing and/or vibration techniques. Emphasizing that all should Create an Action Plan. Written material given at graduation. Flowsheet Row Pulmonary Rehab from 12/04/2021 in [facility] Cardiac and Pulmonary Rehab  Education need identified 12/04/21       AED/CPR: - Group verbal and written instruction with the use of models to demonstrate the basic use of the AED with the basic ABC's of resuscitation.    Anatomy and Cardiac Procedures: - Group verbal and visual presentation and models provide information about basic cardiac anatomy and function. Reviews the testing methods done to diagnose heart disease and the outcomes of the test results. Describes the treatment choices: Medical Management, Angioplasty, or Coronary Bypass Surgery for treating various heart conditions including Myocardial Infarction, Angina, Valve Disease, and Cardiac Arrhythmias.  Written material given at graduation. Flowsheet Row Pulmonary Rehab from 01/09/2022 in [facility] Cardiac and Pulmonary Rehab  Date 12/26/21  Educator SB  Instruction Review Code 1-  Verbalizes Understanding       Medication Safety: - Group verbal and visual instruction to review commonly prescribed medications for heart and lung disease. Reviews the medication, class of the drug, and side effects. Includes the steps to properly store meds and maintain the prescription regimen.  Written material given at graduation. Flowsheet Row Pulmonary Rehab from 01/09/2022 in [facility] Cardiac and Pulmonary Rehab  Date 01/09/22  Educator SB  Instruction Review Code 1- Verbalizes Understanding       Other: -Provides group and verbal instruction on various topics (see comments)   Knowledge Questionnaire Score:  Knowledge Questionnaire Score - 12/04/21 1237       Knowledge Questionnaire Score   Pre Score 14/18: Lung disease, Oxygen, Exercise              Core Components/Risk Factors/Patient Goals at Admission:  Personal Goals and Risk Factors at Admission - 12/04/21 1255       Core Components/Risk Factors/Patient Goals on Admission    Weight Management Yes;Weight Loss    Intervention Weight Management: Develop a combined nutrition and exercise program designed to reach desired caloric intake, while maintaining appropriate intake of  nutrient and fiber, sodium and fats, and appropriate energy expenditure required for the weight goal.;Weight Management: Provide education and appropriate resources to help participant work on and attain dietary goals.;Weight Management/Obesity: Establish reasonable short term and long term weight goals.;Obesity: Provide education and appropriate resources to help participant work on and attain dietary goals.    Admit Weight 238 lb (108 kg)    Goal Weight: Short Term 233 lb (105.7 kg)    Goal Weight: Long Term 215 lb (97.5 kg)    Expected Outcomes Short Term: Continue to assess and modify interventions until short term weight is achieved;Long Term: Adherence to nutrition and physical activity/exercise program aimed toward attainment of established  weight goal;Weight Loss: Understanding of general recommendations for a balanced deficit meal plan, which promotes 1-2 lb weight loss per week and includes a negative energy balance of [phone] kcal/d;Understanding recommendations for meals to include 15-35% energy as protein, 25-35% energy from fat, 35-60% energy from carbohydrates, less than 27m of dietary cholesterol, 20-35 gm of total fiber daily;Understanding of distribution of calorie intake throughout the day with the consumption of 4-5 meals/snacks    Improve shortness of breath with ADL's Yes    Intervention Provide education, individualized exercise plan and daily activity instruction to help decrease symptoms of SOB with activities of daily living.    Expected Outcomes Short Term: Improve cardiorespiratory fitness to achieve a reduction of symptoms when performing ADLs;Long Term: Be able to perform more ADLs without symptoms or delay the onset of symptoms    Hypertension Yes    Intervention Provide education on lifestyle modifcations including regular physical activity/exercise, weight management, moderate sodium restriction and increased consumption of fresh fruit, vegetables, and low fat dairy, alcohol moderation, and smoking cessation.;Monitor prescription use compliance.    Expected Outcomes Short Term: Continued assessment and intervention until BP is < 140/967mHG in hypertensive participants. < 130/8068mG in hypertensive participants with diabetes, heart failure or chronic kidney disease.;Long Term: Maintenance of blood pressure at goal levels.    Lipids Yes    Intervention Provide education and support for participant on nutrition & aerobic/resistive exercise along with prescribed medications to achieve LDL <95m37mDL >40mg51m Expected Outcomes Short Term: Participant states understanding of desired cholesterol values and is compliant with medications prescribed. Participant is following exercise prescription and nutrition  guidelines.;Long Term: Cholesterol controlled with medications as prescribed, with individualized exercise RX and with personalized nutrition plan. Value goals: LDL < 95mg,19m > 40 mg.             Education:Diabetes - Individual verbal and written instruction to review signs/symptoms of diabetes, desired ranges of glucose level fasting, after meals and with exercise. Acknowledge that pre and post exercise glucose checks will be done for 3 sessions at entry of program.   Know Your Numbers and Heart Failure: - Group verbal and visual instruction to discuss disease risk factors for cardiac and pulmonary disease and treatment options.  Reviews associated critical values for Overweight/Obesity, Hypertension, Cholesterol, and Diabetes.  Discusses basics of heart failure: signs/symptoms and treatments.  Introduces Heart Failure Zone chart for action plan for heart failure.  Written material given at graduation.   Core Components/Risk Factors/Patient Goals Review:   Goals and Risk Factor Review     Row Name 12/30/21 0853             Core Components/Risk Factors/Patient Goals Review   Personal Goals Review Weight Management/Obesity;Hypertension;Lipids;Improve shortness of breath with ADL's  Review Mateus is doing well in rehab.  His weight goes up and down and is fairly steady.  However, he did gain weight over the holidays and is trying to get it back off again.  HIs pressures are also up and down and he does keep any eye on them at home. He is doing okay with his breathing, he has good days and bad days.  He does use his PLB.       Expected Outcomes Short: Continue to work on breathing Long: Continue to monitor risk factors.                Core Components/Risk Factors/Patient Goals at Discharge (Final Review):   Goals and Risk Factor Review - 12/30/21 0853       Core Components/Risk Factors/Patient Goals Review   Personal Goals Review Weight  Management/Obesity;Hypertension;Lipids;Improve shortness of breath with ADL's    Review Lenell is doing well in rehab.  His weight goes up and down and is fairly steady.  However, he did gain weight over the holidays and is trying to get it back off again.  HIs pressures are also up and down and he does keep any eye on them at home. He is doing okay with his breathing, he has good days and bad days.  He does use his PLB.    Expected Outcomes Short: Continue to work on breathing Long: Continue to monitor risk factors.             ITP Comments:  ITP Comments     Row Name 10/23/21 1537 12/04/21 1229 12/10/21 1527 12/17/21 0820 12/18/21 0731   ITP Comments Virtual Visit completed. Patient informed on EP and RD appointment and 6 Minute walk test. Patient also informed of patient health questionnaires on My Chart. Patient Verbalizes understanding. Visit diagnosis can be found in [facility] 09/15/2021. Completed 6MWT and gym orientation. Initial ITP created and sent for review to Dr. [name], Medical Director. Karandeep had to delay his 1st day due to falling and wanted to get evaluated for possible injuries. Spoke with patient today who stated he went to Urgent Care and got X Rays and was told nothing was broken. He feels better overall and is planning to start back next week, 12/27 when he returns from out of town. First full day of exercise!  Patient was oriented to gym and equipment including functions, settings, policies, and procedures.  Patient's individual exercise prescription and treatment plan were reviewed.  All starting workloads were established based on the results of the 6 minute walk test done at initial orientation visit.  The plan for exercise progression was also introduced and progression will be customized based on patient's performance and goals. 30 Day review completed. Medical Director ITP review done, changes made as directed, and signed approval by Medical Director.    Row Name 12/31/21  [phone] 01/09/22 0755 01/15/22 0900       ITP Comments Completed initial RD consultation Pt able to follow exercise prescription today without complaint.  Pt has been out due to cellulitis in leg. He stated his leg is some better and is able to exercise today at a lower level. Will continue to monitor for progression. 30 Day review completed. Medical Director ITP review done, changes made as directed, and signed approval by Medical Director.              Comments:

## 2022-01-16 ENCOUNTER — Telehealth: Payer: Self-pay | Admitting: Family Medicine

## 2022-01-16 ENCOUNTER — Ambulatory Visit: Payer: 59 | Admitting: Neurology

## 2022-01-16 NOTE — Telephone Encounter (Signed)
Arna Medici NP is calling from [facility] is calling to request a referral to be placed for the patient on [facility] website to produce referral ID number from the pts PCP. This needs to be attached to the prior authorization.  PA is need for a laser procedure to treat his venous reflex.   Original referral was placed by Dr. Ancil Boozer on 05/15/2021 by Dr. Ancil Boozer.  Once the referral Id is placed. If the number can given to [place] at the below number. Ok to leave VM.  CB- [phone]

## 2022-01-16 NOTE — Telephone Encounter (Signed)
Do you know how to do this? Never done this before

## 2022-01-17 NOTE — Telephone Encounter (Signed)
Is there a referral ID associated?

## 2022-01-17 NOTE — Telephone Encounter (Signed)
I do need an auth for the procedure, but it is my understanding we need to submit that, unless they gave you an auth for that as well

## 2022-01-17 NOTE — Telephone Encounter (Signed)
Hi Tisha....  The procedure code is [id number] Most insurance companies give a three month window, so I would say visit start date of 01/24/2022.  The last office visit was on 01/03/2022.    Thanks for helping with this!

## 2022-01-20 ENCOUNTER — Encounter: Payer: 59 | Admitting: *Deleted

## 2022-01-20 ENCOUNTER — Other Ambulatory Visit: Payer: Self-pay

## 2022-01-20 ENCOUNTER — Ambulatory Visit: Payer: 59 | Admitting: Nurse Practitioner

## 2022-01-20 DIAGNOSIS — J45909 Unspecified asthma, uncomplicated: Secondary | ICD-10-CM | POA: Diagnosis not present

## 2022-01-20 NOTE — Progress Notes (Signed)
Daily Session Note  Patient Details  Name: Jonathon Snow MRN: [id number] Date of Birth: 1961-01-01 Referring Provider:   Flowsheet Row Pulmonary Rehab from 12/04/2021 in [facility] Cardiac and Pulmonary Rehab  Referring Provider [name] MD       Encounter Date: 01/20/2022  Check In:  Session Check In - 01/20/22 0816       Check-In   Supervising physician immediately available to respond to emergencies See telemetry face sheet for immediately available ER MD    Location [facility]-Cardiac & Pulmonary Rehab    Staff Present Heath Lark, RN, BSN, CCRP;Joseph Everetts, RCP,RRT,BSRT;Laureen Bemidji, BS, RRT, CPFT;Kelly Amedeo Plenty, BS, ACSM CEP, Exercise Physiologist    Virtual Visit No    Medication changes reported     No    Fall or balance concerns reported    No    Warm-up and Cool-down Performed on first and last piece of equipment    Resistance Training Performed Yes    VAD Patient? No    PAD/SET Patient? No      Pain Assessment   Currently in Pain? No/denies                Social History   Tobacco Use  Smoking Status Never  Smokeless Tobacco Never    Goals Met:  Proper associated with RPD/PD & O2 Sat Independence with exercise equipment Exercise tolerated well No report of concerns or symptoms today  Goals Unmet:  Not Applicable  Comments: Pt able to follow exercise prescription today without complaint.  Will continue to monitor for progression.    Dr. Emily Filbert is Medical Director for [place].  Dr. [name] is Medical Director for [facility] Pulmonary Rehabilitation.

## 2022-01-21 ENCOUNTER — Other Ambulatory Visit: Payer: Self-pay

## 2022-01-21 DIAGNOSIS — J45909 Unspecified asthma, uncomplicated: Secondary | ICD-10-CM | POA: Diagnosis not present

## 2022-01-21 NOTE — Progress Notes (Signed)
Daily Session Note  Patient Details  Name: Jonathon Snow MRN: [id number] Date of Birth: Mar 29, 1961 Referring Provider:   Flowsheet Row Pulmonary Rehab from 12/04/2021 in [facility] Cardiac and Pulmonary Rehab  Referring Provider [name] MD       Encounter Date: 01/21/2022  Check In:  Session Check In - 01/21/22 0736       Check-In   Supervising physician immediately available to respond to emergencies See telemetry face sheet for immediately available ER MD    Location [facility]-Cardiac & Pulmonary Rehab    Staff Present Birdie Sons, MPA, RN;Amanda Oletta Darter, BA, ACSM CEP, Exercise Physiologist    Virtual Visit No    Medication changes reported     No    Fall or balance concerns reported    No    Warm-up and Cool-down Performed on first and last piece of equipment    Resistance Training Performed Yes    VAD Patient? No    PAD/SET Patient? No      Pain Assessment   Currently in Pain? No/denies                Social History   Tobacco Use  Smoking Status Never  Smokeless Tobacco Never    Goals Met:  Independence with exercise equipment Exercise tolerated well No report of concerns or symptoms today Strength training completed today  Goals Unmet:  Not Applicable  Comments: Pt able to follow exercise prescription today without complaint.  Will continue to monitor for progression.    Dr. Emily Filbert is Medical Director for [place].  Dr. [name] is Medical Director for [facility] Pulmonary Rehabilitation.

## 2022-01-22 ENCOUNTER — Other Ambulatory Visit: Payer: Self-pay | Admitting: Orthopaedic Surgery

## 2022-01-22 DIAGNOSIS — M47816 Spondylosis without myelopathy or radiculopathy, lumbar region: Secondary | ICD-10-CM

## 2022-01-22 DIAGNOSIS — M545 Low back pain, unspecified: Secondary | ICD-10-CM

## 2022-01-23 ENCOUNTER — Other Ambulatory Visit: Payer: Self-pay

## 2022-01-23 ENCOUNTER — Encounter: Payer: 59 | Attending: Pulmonary Disease

## 2022-01-23 DIAGNOSIS — Z5189 Encounter for other specified aftercare: Secondary | ICD-10-CM | POA: Insufficient documentation

## 2022-01-23 DIAGNOSIS — J45909 Unspecified asthma, uncomplicated: Secondary | ICD-10-CM | POA: Insufficient documentation

## 2022-01-23 LAB — ACID FAST CULTURE WITH REFLEXED SENSITIVITIES (MYCOBACTERIA): Acid Fast Culture: NEGATIVE

## 2022-01-23 NOTE — Progress Notes (Signed)
Daily Session Note  Patient Details  Name: Jonathon Snow MRN: [id number] Date of Birth: 19-Jul-1961 Referring Provider:   Flowsheet Row Pulmonary Rehab from 12/04/2021 in [facility] Cardiac and Pulmonary Rehab  Referring Provider [name] MD       Encounter Date: 01/23/2022  Check In:  Session Check In - 01/23/22 0740       Check-In   Supervising physician immediately available to respond to emergencies See telemetry face sheet for immediately available ER MD    Location [facility]-Cardiac & Pulmonary Rehab    Staff Present Birdie Sons, MPA, Mauricia Area, BS, ACSM CEP, Exercise Physiologist    Virtual Visit No    Medication changes reported     No    Fall or balance concerns reported    No    Warm-up and Cool-down Performed on first and last piece of equipment    Resistance Training Performed Yes    VAD Patient? No    PAD/SET Patient? No      Pain Assessment   Currently in Pain? No/denies                Social History   Tobacco Use  Smoking Status Never  Smokeless Tobacco Never    Goals Met:  Independence with exercise equipment Exercise tolerated well No report of concerns or symptoms today Strength training completed today  Goals Unmet:  Not Applicable  Comments: Pt able to follow exercise prescription today without complaint.  Will continue to monitor for progression.    Dr. Emily Filbert is Medical Director for [place].  Dr. [name] is Medical Director for [facility] Pulmonary Rehabilitation.

## 2022-01-28 ENCOUNTER — Other Ambulatory Visit: Payer: Self-pay

## 2022-01-28 DIAGNOSIS — J45909 Unspecified asthma, uncomplicated: Secondary | ICD-10-CM

## 2022-01-28 DIAGNOSIS — Z5189 Encounter for other specified aftercare: Secondary | ICD-10-CM | POA: Diagnosis not present

## 2022-01-28 NOTE — Progress Notes (Signed)
Daily Session Note  Patient Details  Name: Jonathon Snow MRN: [id number] Date of Birth: 09-18-1961 Referring Provider:   Flowsheet Row Pulmonary Rehab from 12/04/2021 in [facility] Cardiac and Pulmonary Rehab  Referring Provider [name] MD       Encounter Date: 01/28/2022  Check In:  Session Check In - 01/28/22 0740       Check-In   Supervising physician immediately available to respond to emergencies See telemetry face sheet for immediately available ER MD    Location [facility]-Cardiac & Pulmonary Rehab    Staff Present Birdie Sons, MPA, RN;Jessica Luan Pulling, MA, RCEP, CCRP, CCET    Virtual Visit No    Medication changes reported     No    Fall or balance concerns reported    No    Warm-up and Cool-down Performed on first and last piece of equipment    Resistance Training Performed Yes    VAD Patient? No    PAD/SET Patient? No      Pain Assessment   Currently in Pain? No/denies                Social History   Tobacco Use  Smoking Status Never  Smokeless Tobacco Never    Goals Met:  Independence with exercise equipment Exercise tolerated well No report of concerns or symptoms today Strength training completed today  Goals Unmet:  Not Applicable  Comments: Pt able to follow exercise prescription today without complaint.  Will continue to monitor for progression.    Dr. Emily Filbert is Medical Director for [place].  Dr. [name] is Medical Director for [facility] Pulmonary Rehabilitation.

## 2022-01-29 ENCOUNTER — Telehealth: Payer: Self-pay

## 2022-01-29 NOTE — Telephone Encounter (Signed)
Copied from [place] [id number]. Topic: General - Other >> Jan 29, 2022 12:23 PM Erick Blinks wrote: Reason for CRM: Needs updated authorization for [facility]. Holley Raring -Needs to be dated 01/07/2022.  Angel Best contact: [phone]

## 2022-01-29 NOTE — Telephone Encounter (Signed)
Could you help with this?

## 2022-01-29 NOTE — Telephone Encounter (Signed)
Thank you [place].

## 2022-01-29 NOTE — Telephone Encounter (Signed)
Thank you Kristeen Miss, I called Glenard Haring to inform her she said it sounds great, but would like to know if you could call his insurance [employer] and see if by verbally telling them they could change the date.

## 2022-01-30 ENCOUNTER — Other Ambulatory Visit: Payer: Self-pay

## 2022-01-30 DIAGNOSIS — J45909 Unspecified asthma, uncomplicated: Secondary | ICD-10-CM

## 2022-01-30 DIAGNOSIS — Z5189 Encounter for other specified aftercare: Secondary | ICD-10-CM | POA: Diagnosis not present

## 2022-01-30 NOTE — Progress Notes (Signed)
Daily Session Note  Patient Details  Name: Jonathon Snow MRN: [id number] Date of Birth: 21-Sep-1961 Referring Provider:   Flowsheet Row Pulmonary Rehab from 12/04/2021 in [facility] Cardiac and Pulmonary Rehab  Referring Provider [name] MD       Encounter Date: 01/30/2022  Check In:  Session Check In - 01/30/22 0741       Check-In   Supervising physician immediately available to respond to emergencies See telemetry face sheet for immediately available ER MD    Location [facility]-Cardiac & Pulmonary Rehab    Staff Present Birdie Sons, MPA, Mauricia Area, BS, ACSM CEP, Exercise Physiologist    Virtual Visit No    Medication changes reported     No    Fall or balance concerns reported    No    Warm-up and Cool-down Performed on first and last piece of equipment    Resistance Training Performed Yes    VAD Patient? No    PAD/SET Patient? No      Pain Assessment   Currently in Pain? No/denies                Social History   Tobacco Use  Smoking Status Never  Smokeless Tobacco Never    Goals Met:  Independence with exercise equipment Exercise tolerated well No report of concerns or symptoms today Strength training completed today  Goals Unmet:  Not Applicable  Comments: Pt able to follow exercise prescription today without complaint.  Will continue to monitor for progression.    Dr. Emily Filbert is Medical Director for [place].  Dr. [name] is Medical Director for [facility] Pulmonary Rehabilitation.

## 2022-01-30 NOTE — Telephone Encounter (Signed)
Thank you again [place]! Informed Angel from [employer].

## 2022-02-03 ENCOUNTER — Encounter: Payer: 59 | Admitting: *Deleted

## 2022-02-03 ENCOUNTER — Other Ambulatory Visit: Payer: Self-pay

## 2022-02-03 DIAGNOSIS — J45909 Unspecified asthma, uncomplicated: Secondary | ICD-10-CM

## 2022-02-03 DIAGNOSIS — Z5189 Encounter for other specified aftercare: Secondary | ICD-10-CM | POA: Diagnosis not present

## 2022-02-03 NOTE — Progress Notes (Signed)
Daily Session Note  Patient Details  Name: Jonathon Snow MRN: [id number] Date of Birth: [birth year] Referring Provider:   Flowsheet Row Pulmonary Rehab from 12/04/2021 in [facility] Cardiac and Pulmonary Rehab  Referring Provider [name] MD       Encounter Date: 02/03/2022  Check In:  Session Check In - 02/03/22 0834       Check-In   Supervising physician immediately available to respond to emergencies See telemetry face sheet for immediately available ER MD    Location [facility]-Cardiac & Pulmonary Rehab    Staff Present Heath Lark, RN, BSN, Laveda Norman, BS, ACSM CEP, Exercise Physiologist;Joseph [place]kin, [place]    Virtual Visit No    Medication changes reported     No    Fall or balance concerns reported    No    Warm-up and Cool-down Performed on first and last piece of equipment    Resistance Training Performed Yes    VAD Patient? No    PAD/SET Patient? No      Pain Assessment   Currently in Pain? No/denies                Social History   Tobacco Use  Smoking Status Never  Smokeless Tobacco Never    Goals Met:  Proper associated with RPD/PD & O2 Sat Independence with exercise equipment Exercise tolerated well No report of concerns or symptoms today  Goals Unmet:  Not Applicable  Comments: Pt able to follow exercise prescription today without complaint.  Will continue to monitor for progression.    Dr. Emily Filbert is Medical Director for [place].  Dr. [name] is Medical Director for [facility] Pulmonary Rehabilitation.

## 2022-02-04 ENCOUNTER — Other Ambulatory Visit: Payer: Self-pay | Admitting: Family Medicine

## 2022-02-04 DIAGNOSIS — E785 Hyperlipidemia, unspecified: Secondary | ICD-10-CM

## 2022-02-04 NOTE — Telephone Encounter (Signed)
Medication Refill - Medication: rosuvastatin (CRESTOR) 20 MG tablet   Has the patient contacted their pharmacy? Yes.  Pt stated he was informed to contact the office.  (Agent: If no, request that the patient contact the pharmacy for the refill. If patient does not wish to contact the pharmacy document the reason why and proceed with request.) (Agent: If yes, when and what did the pharmacy advise?)  Preferred Pharmacy (with phone number or street name):   [facility] [id number], [place]  [place] [address]  Phone: [phone] Fax: [phone]     Has the patient been seen for an appointment in the last year OR does the patient have an upcoming appointment? Yes.    Agent: Please be advised that RX refills may take up to 3 business days. We ask that you follow-up with your pharmacy.

## 2022-02-05 MED ORDER — ROSUVASTATIN CALCIUM 20 MG PO TABS: ORAL_TABLET | ORAL | 0 refills | Status: DC

## 2022-02-05 NOTE — Telephone Encounter (Signed)
Requested Prescriptions  Pending Prescriptions Disp Refills   rosuvastatin (CRESTOR) 20 MG tablet 45 tablet 0    Sig: Take one pill every other night before bed.     Cardiovascular:  Antilipid - Statins 2 Failed - 02/04/2022  2:40 PM      Failed - Cr in normal range and within 360 days    Creat  Date Value Ref Range Status  09/18/2021 1.64 (H) 0.70 - 1.30 mg/dL Final   Creatinine, Urine  Date Value Ref Range Status  04/28/2017 74 mg/dL Final         Failed - Lipid Panel in normal range within the last 12 months    Cholesterol, Total  Date Value Ref Range Status  04/17/2016 208 (H) 100 - 199 mg/dL Final   Cholesterol  Date Value Ref Range Status  09/18/2021 139 <200 mg/dL Final   LDL Cholesterol (Calc)  Date Value Ref Range Status  09/18/2021 76 mg/dL (calc) Final    Comment:    Reference range: <100 . Desirable range <100 mg/dL for primary prevention;   <70 mg/dL for patients with CHD or diabetic patients  with > or = 2 CHD risk factors. [name] LDL-C is now calculated using the Martin-Hopkins  calculation, which is a validated novel method providing  better accuracy than the Friedewald equation in the  estimation of LDL-C.  Cresenciano Genre et al. Annamaria Helling. 3646;803(21): 2061-2068  (http://education.[employer].com/faq/FAQ164)    HDL  Date Value Ref Range Status  09/18/2021 45 > OR = 40 mg/dL Final  04/17/2016 48 >39 mg/dL Final   Triglycerides  Date Value Ref Range Status  09/18/2021 92 <150 mg/dL Final         Passed - Patient is not pregnant      Passed - Valid encounter within last 12 months    Recent Outpatient Visits          4 months ago Diarrhea, unspecified type   [place], DO   4 months ago Current moderate episode of major depressive disorder, unspecified whether recurrent [facility])   [place], FNP   4 months ago SOB (shortness of breath)   [name]  Delsa Grana, PA-C   5 months ago SOB (shortness of breath)   [facility] Steele Sizer, MD   6 months ago Need for Tdap vaccination   [facility] Kathrine Haddock, NP      Future Appointments            In 1 month Pieter Partridge, [facility] [place]   In 2 months Hollice Espy, MD [place]

## 2022-02-07 ENCOUNTER — Telehealth: Payer: Self-pay

## 2022-02-07 DIAGNOSIS — Z86718 Personal history of other venous thrombosis and embolism: Secondary | ICD-10-CM

## 2022-02-07 DIAGNOSIS — Z7901 Long term (current) use of anticoagulants: Secondary | ICD-10-CM

## 2022-02-07 DIAGNOSIS — Z86711 Personal history of pulmonary embolism: Secondary | ICD-10-CM

## 2022-02-07 NOTE — Telephone Encounter (Signed)
On my behalf I do not see a hematology referral that was placed for him. Do you? He will need to be seen in order to get one placed correct?

## 2022-02-07 NOTE — Telephone Encounter (Signed)
Copied from [place] [id number]. Topic: General - Other >> Feb 06, 2022  4:30 PM Tessa Lerner A wrote: Reason for CRM: Liane Comber with [facility] has called to share that a referral is required for the patient to be seen by their facility   The referral can be faxed to [phone] Jonathon Snow   Please contact further when possible

## 2022-02-07 NOTE — Telephone Encounter (Signed)
Tried to call pt to get more information no answer, left vm to call back. I even tried to call Glenard Haring from [place] and straight to Mining [employer].

## 2022-02-07 NOTE — Addendum Note (Signed)
Addended by: Docia Furl on: 02/07/2022 08:50 AM   Modules accepted: Orders

## 2022-02-07 NOTE — Telephone Encounter (Signed)
New referral placed to [facility] per pt ins.

## 2022-02-10 ENCOUNTER — Telehealth: Payer: Self-pay

## 2022-02-10 NOTE — Telephone Encounter (Signed)
Spoke with patient regarding rehab appointment. His back is still hurting and has an appointment on Wednesday with his doctor. Waiting until that then to see what doctor says about exercise and rehab.

## 2022-02-12 ENCOUNTER — Encounter: Payer: Self-pay | Admitting: *Deleted

## 2022-02-12 DIAGNOSIS — J45909 Unspecified asthma, uncomplicated: Secondary | ICD-10-CM

## 2022-02-12 NOTE — Progress Notes (Signed)
Pulmonary Individual Treatment Plan  Patient Details  Name: Jonathon Snow MRN: [id number] Date of Birth: Jan 25, 1961 Referring Provider:   Flowsheet Row Pulmonary Rehab from 12/04/2021 in [facility] Cardiac and Pulmonary Rehab  Referring Provider [name] MD       Initial Encounter Date:  Flowsheet Row Pulmonary Rehab from 12/04/2021 in [facility] Cardiac and Pulmonary Rehab  Date 12/04/21       Visit Diagnosis: Asthma, unspecified asthma severity, unspecified whether complicated, unspecified whether persistent  Patient's Home Medications on Admission:  Current Outpatient Medications:    albuterol (VENTOLIN HFA) 108 (90 Base) MCG/ACT inhaler, Inhale 2 puffs into the lungs every 6 (six) hours as needed for wheezing or shortness of breath., Disp: 18 g, Rfl: 2   amLODipine (NORVASC) 5 MG tablet, Take 1 tablet (5 mg total) by mouth daily., Disp: 90 tablet, Rfl: 3   apixaban (ELIQUIS) 5 MG TABS tablet, Take 1 tablet (5 mg total) by mouth 2 (two) times daily., Disp: 60 tablet, Rfl: 2   cholecalciferol (VITAMIN D3) 25 MCG (1000 UNIT) tablet, Take 1,000 Units by mouth daily., Disp: , Rfl:    COVID-19 mRNA bivalent vaccine, Pfizer, (PFIZER COVID-19 VAC BIVALENT) injection, Inject into the muscle., Disp: 0.3 mL, Rfl: 0   doxycycline (VIBRA-TABS) 100 MG tablet, Take 1 tablet (100 mg total) by mouth 2 (two) times daily., Disp: 14 tablet, Rfl: 0   ferrous sulfate 325 (65 FE) MG tablet, Take 325 mg by mouth daily with breakfast., Disp: , Rfl:    ipratropium-albuterol (DUONEB) 0.5-2.5 (3) MG/3ML SOLN, Take 3 mLs by nebulization every 4 (four) hours as needed., Disp: 360 mL, Rfl: 3   losartan (COZAAR) 100 MG tablet, Take 1 tablet (100 mg total) by mouth daily., Disp: 90 tablet, Rfl: 0   montelukast (SINGULAIR) 10 MG tablet, Take 1 tablet (10 mg total) by mouth at bedtime., Disp: 90 tablet, Rfl: 3   Multiple Vitamins-Minerals (MULTIVITAMIN WITH MINERALS) tablet, Take 1 tablet by mouth daily., Disp: ,  Rfl:    mupirocin ointment (BACTROBAN) 2 %, Apply 1 application topically daily. Until healed at bottom of right foot, Disp: 22 g, Rfl: 0   mycophenolate (CELLCEPT) 500 MG tablet, Take 1,000 mg by mouth 2 (two) times daily., Disp: , Rfl:    nystatin-triamcinolone ointment (MYCOLOG), Apply 1 application topically 2 (two) times daily., Disp: 30 g, Rfl: 0   oxybutynin (DITROPAN XL) 15 MG 24 hr tablet, Take 1 tablet (15 mg total) by mouth daily., Disp: 30 tablet, Rfl: 11   predniSONE (DELTASONE) 10 MG tablet, Take 10 mg by mouth daily with breakfast., Disp: , Rfl:    rosuvastatin (CRESTOR) 20 MG tablet, Take one pill every other night before bed., Disp: 45 tablet, Rfl: 0   sertraline (ZOLOFT) 25 MG tablet, Take 1 tablet (25 mg total) by mouth daily., Disp: 30 tablet, Rfl: 0   sildenafil (REVATIO) 20 MG tablet, 3-5 tablets as needed 30 min prior to intercourse, Disp: 30 tablet, Rfl: 11   tamsulosin (FLOMAX) 0.4 MG CAPS capsule, Take 1 capsule (0.4 mg total) by mouth daily., Disp: 30 capsule, Rfl: 11   traMADol (ULTRAM) 50 MG tablet, Take 1 tablet (50 mg total) by mouth every 6 (six) hours as needed., Disp: 20 tablet, Rfl: 0  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   Overview:  superior sagittal sinus, left transverse sinus and cortical veins    COVID-19  virus infection 12/2020   Depression    DVT (deep venous thrombosis) (HCC)    Dyspnea    GERD (gastroesophageal reflux disease)    Heel spur, left 02/16/2019   Heel spur, right 02/16/2019   Hyperlipidemia    Hypertension    Lupus ([place])    Lymphedema 10/07/2018   Morbid obesity ([place])    Opiate abuse, episodic ([place]) 02/26/2018   Osteoporosis    Pneumonia    PONV (postoperative nausea and vomiting)    Postphlebitic syndrome with ulcer, 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   Presence of IVC filter 03/22/2020   Removed   Pulmonary embolism (HCC)    Renal  disorder    Stage III    Tobacco Use: Social History   Tobacco Use  Smoking Status Never  Smokeless Tobacco Never    Labs: Recent Review Flowsheet Data     Labs for ITP Cardiac and Pulmonary Rehab Latest Ref Rng & Units 02/15/2019 01/17/2020 11/07/2020 03/19/2021 09/18/2021   Cholestrol <200 mg/dL 244(H) 227(H) 275(H) - 139   LDLCALC mg/dL (calc) 169(H) 159(H) 196(H) - 76   HDL > OR = 40 mg/dL 50 46 55 - 45   Trlycerides <150 mg/dL 119 108 107 - 92   Hemoglobin A1c <5.7 % of total Hgb - - 5.8(H) 5.8(H) 5.7(H)   PHART 7.350 - 7.450 - - - - -   PCO2ART 32.0 - 48.0 mmHg - - - - -   HCO3 20.0 - 28.0 mmol/L - - - - -   TCO2 0 - 100 mmol/L - - - - -   ACIDBASEDEF 0.0 - 2.0 mmol/L - - - - -   O2SAT % - - - - -        Pulmonary Assessment Scores:  Pulmonary Assessment Scores     Row Name 12/04/21 1237         ADL UCSD   ADL Phase Entry     SOB Score total 79     Rest 1     Walk 5     Stairs 5     Bath 2     Dress 2     Shop 3       CAT Score   CAT Score 20       mMRC Score   mMRC Score 3              UCSD: Self-administered rating of dyspnea associated with activities of daily living (ADLs) 6-point scale (0 = "not at all" to 5 = "maximal or unable to do because of breathlessness")  Scoring Scores range from 0 to 120.  Minimally important difference is 5 units  CAT: CAT can identify the health impairment of COPD patients and is better correlated with disease progression.  CAT has a scoring range of zero to 40. The CAT score is classified into four groups of low (less than 10), medium (10 - 20), high (21-30) and very high (31-40) based on the impact level of disease on health status. A CAT score over 10 suggests significant symptoms.  A worsening CAT score could be explained by an exacerbation, poor medication adherence, poor inhaler technique, or progression of COPD or comorbid conditions.  CAT MCID is 2 points  mMRC: mMRC (Modified Medical Research Council)  Dyspnea Scale is used to assess the degree of baseline functional disability in patients of respiratory disease due to dyspnea. No minimal important difference is established. A decrease in score of 1 point or greater is  considered a positive change.   Pulmonary Function Assessment:  Pulmonary Function Assessment - 10/23/21 1538       Breath   Shortness of Breath Yes;Limiting activity;Panic with Shortness of Breath             Exercise Target Goals: Exercise Program Goal: Individual exercise prescription set using results from initial 6 min walk test and THRR while considering  patients activity barriers and safety.   Exercise Prescription Goal: Initial exercise prescription builds to 30-45 minutes a day of aerobic activity, 2-3 days per week.  Home exercise guidelines will be given to patient during program as part of exercise prescription that the participant will acknowledge.  Education: Aerobic Exercise: - Group verbal and visual presentation on the components of exercise prescription. Introduces F.I.T.T principle from ACSM for exercise prescriptions.  Reviews F.I.T.T. principles of aerobic exercise including progression. Written material given at graduation. Flowsheet Row Pulmonary Rehab from 01/30/2022 in [facility] Cardiac and Pulmonary Rehab  Date 12/19/21  Educator [facility]  Instruction Review Code 1- Verbalizes Understanding       Education: Resistance Exercise: - Group verbal and visual presentation on the components of exercise prescription. Introduces F.I.T.T principle from ACSM for exercise prescriptions  Reviews F.I.T.T. principles of resistance exercise including progression. Written material given at graduation. Flowsheet Row Pulmonary Rehab from 01/30/2022 in [facility] Cardiac and Pulmonary Rehab  Date 12/26/21  Educator [facility]  Instruction Review Code 1- Verbalizes Understanding        Education: Exercise & Equipment Safety: - Individual verbal instruction and demonstration of  equipment use and safety with use of the equipment. Flowsheet Row Pulmonary Rehab from 10/23/2021 in [facility] Cardiac and Pulmonary Rehab  Date 10/23/21  Educator [facility]  Instruction Review Code 1- Verbalizes Understanding       Education: Exercise Physiology & General Exercise Guidelines: - Group verbal and written instruction with models to review the exercise physiology of the cardiovascular system and associated critical values. Provides general exercise guidelines with specific guidelines to those with heart or lung disease.  Flowsheet Row Pulmonary Rehab from 12/04/2021 in [facility] Cardiac and Pulmonary Rehab  Education need identified 12/04/21       Education: Flexibility, Balance, Mind/Body Relaxation: - Group verbal and visual presentation with interactive activity on the components of exercise prescription. Introduces F.I.T.T principle from ACSM for exercise prescriptions. Reviews F.I.T.T. principles of flexibility and balance exercise training including progression. Also discusses the mind body connection.  Reviews various relaxation techniques to help reduce and manage stress (i.e. Deep breathing, progressive muscle relaxation, and visualization). Balance handout provided to take home. Written material given at graduation.   Activity Barriers & Risk Stratification:  Activity Barriers & Cardiac Risk Stratification - 12/04/21 1245       Activity Barriers & Cardiac Risk Stratification   Activity Barriers Joint Problems;Deconditioning;Muscular Weakness;Back Problems;Shortness of Breath;Other (comment)    Comments Lupus             6 Minute Walk:  6 Minute Walk     Row Name 12/04/21 1246         6 Minute Walk   Phase Initial     Distance 1060 feet     Walk Time 6 minutes     # of Rest Breaks 0     MPH 2     METS 3.37     RPE 11     Perceived Dyspnea  3     VO2 Peak 11.82     Symptoms Yes (comment)  Comments SOB, fatigue     Resting HR 93 bpm     Resting BP 124/72      Resting Oxygen Saturation  98 %     Exercise Oxygen Saturation  during 6 min walk 95 %     Max Ex. HR 118 bpm     Max Ex. BP 154/80     2 Minute Post BP 128/76       Interval HR   1 Minute HR 106     2 Minute HR 108     3 Minute HR 113     4 Minute HR 118     5 Minute HR 114     6 Minute HR 115     2 Minute Post HR 94     Interval Heart Rate? Yes       Interval Oxygen   Interval Oxygen? Yes     Baseline Oxygen Saturation % 98 %     1 Minute Oxygen Saturation % 96 %     1 Minute Liters of Oxygen 0 L  RA     2 Minute Oxygen Saturation % 95 %     2 Minute Liters of Oxygen 0 L     3 Minute Oxygen Saturation % 95 %     3 Minute Liters of Oxygen 0 L     4 Minute Oxygen Saturation % 97 %     4 Minute Liters of Oxygen 0 L     5 Minute Oxygen Saturation % 96 %     5 Minute Liters of Oxygen 0 L     6 Minute Oxygen Saturation % 97 %     6 Minute Liters of Oxygen 0 L     2 Minute Post Oxygen Saturation % 97 %     2 Minute Post Liters of Oxygen 0 L             Oxygen Initial Assessment:  Oxygen Initial Assessment - 12/04/21 1237       Home Oxygen   Home Oxygen Device None    Sleep Oxygen Prescription None    Home Exercise Oxygen Prescription None    Home Resting Oxygen Prescription None    Compliance with Home Oxygen Use Yes      Initial 6 min Walk   Oxygen Used None      Program Oxygen Prescription   Program Oxygen Prescription None      Intervention   Short Term Goals To learn and exhibit compliance with exercise, home and travel O2 prescription;To learn and understand importance of monitoring SPO2 with pulse oximeter and demonstrate accurate use of the pulse oximeter.;To learn and understand importance of maintaining oxygen saturations>88%;To learn and demonstrate proper pursed lip breathing techniques or other breathing techniques. ;To learn and demonstrate proper use of respiratory medications    Long  Term Goals Exhibits compliance with exercise, home  and travel  O2 prescription;Verbalizes importance of monitoring SPO2 with pulse oximeter and return demonstration;Maintenance of O2 saturations>88%;Exhibits proper breathing techniques, such as pursed lip breathing or other method taught during program session;Compliance with respiratory medication;Demonstrates proper use of MDIs             Oxygen Re-Evaluation:  Oxygen Re-Evaluation     Row Name 12/17/21 4765 12/30/21 0911 01/21/22 0805         Program Oxygen Prescription   Program Oxygen Prescription None None None       Home Oxygen   Home Oxygen  Device None None None     Sleep Oxygen Prescription None None None     Home Exercise Oxygen Prescription None None None     Home Resting Oxygen Prescription None None None     Compliance with Home Oxygen Use Yes -- Yes       Goals/Expected Outcomes   Short Term Goals To learn and demonstrate proper pursed lip breathing techniques or other breathing techniques. ;To learn and demonstrate proper use of respiratory medications To learn and demonstrate proper pursed lip breathing techniques or other breathing techniques. ;To learn and demonstrate proper use of respiratory medications;To learn and understand importance of maintaining oxygen saturations>88%;To learn and understand importance of monitoring SPO2 with pulse oximeter and demonstrate accurate use of the pulse oximeter. To learn and demonstrate proper pursed lip breathing techniques or other breathing techniques. ;To learn and demonstrate proper use of respiratory medications;To learn and understand importance of maintaining oxygen saturations>88%;To learn and understand importance of monitoring SPO2 with pulse oximeter and demonstrate accurate use of the pulse oximeter.     Long  Term Goals Exhibits proper breathing techniques, such as pursed lip breathing or other method taught during program session;Compliance with respiratory medication;Demonstrates proper use of MDIs Exhibits proper breathing  techniques, such as pursed lip breathing or other method taught during program session;Compliance with respiratory medication;Demonstrates proper use of MDIs;Verbalizes importance of monitoring SPO2 with pulse oximeter and return demonstration;Maintenance of O2 saturations>88% Exhibits proper breathing techniques, such as pursed lip breathing or other method taught during program session;Compliance with respiratory medication;Demonstrates proper use of MDIs;Verbalizes importance of monitoring SPO2 with pulse oximeter and return demonstration;Maintenance of O2 saturations>88%     Comments Reviewed PLB technique with pt.  Talked about how it works and it's importance in maintaining their exercise saturations.      Short: Become more profiecient at using PLB.   Long: Become independent at using PLB. Fread is doing well in rehab. He is using his PLB and finds it helpful, but still having some SOB.  He is doing well on his medicaitons.  His saturations are staying up.  He will continue to exercise to help with breathing overall. [place] still practices PLB.  He does monitor O2 while exercising at home.  He reports using medications as directed.     Goals/Expected Outcomes Short: Become more profiecient at using PLB.   Long: Become independent at using PLB. Short: Continue to exercise for breathing benefits Long: continue to use PLB to help manage SOB Short: continue to exercise to help brathing Long: be independent using PLB              Oxygen Discharge (Final Oxygen Re-Evaluation):  Oxygen Re-Evaluation - 01/21/22 0805       Program Oxygen Prescription   Program Oxygen Prescription None      Home Oxygen   Home Oxygen Device None    Sleep Oxygen Prescription None    Home Exercise Oxygen Prescription None    Home Resting Oxygen Prescription None    Compliance with Home Oxygen Use Yes      Goals/Expected Outcomes   Short Term Goals To learn and demonstrate proper pursed lip breathing techniques or  other breathing techniques. ;To learn and demonstrate proper use of respiratory medications;To learn and understand importance of maintaining oxygen saturations>88%;To learn and understand importance of monitoring SPO2 with pulse oximeter and demonstrate accurate use of the pulse oximeter.    Long  Term Goals Exhibits proper breathing techniques, such as pursed  lip breathing or other method taught during program session;Compliance with respiratory medication;Demonstrates proper use of MDIs;Verbalizes importance of monitoring SPO2 with pulse oximeter and return demonstration;Maintenance of O2 saturations>88%    Comments Daron still practices PLB.  He does monitor O2 while exercising at home.  He reports using medications as directed.    Goals/Expected Outcomes Short: continue to exercise to help brathing Long: be independent using PLB             Initial Exercise Prescription:  Initial Exercise Prescription - 12/04/21 1200       Date of Initial Exercise RX and Referring Provider   Date 12/04/21    Referring Provider [name] MD      Oxygen   Maintain Oxygen Saturation 88% or higher      Treadmill   MPH 1.8    Grade 0    Minutes 15    METs 2.38      Recumbant Bike   Level 1    RPM 60    Watts 30    Minutes 15    METs 3.3      NuStep   Level 1    SPM 80    Minutes 15    METs 3.3      Prescription Details   Frequency (times per week) 2    Duration Progress to 30 minutes of continuous aerobic without signs/symptoms of physical distress      Intensity   THRR 40-80% of Max Heartrate 120-147    Ratings of Perceived Exertion 11-13    Perceived Dyspnea 0-4      Progression   Progression Continue to progress workloads to maintain intensity without signs/symptoms of physical distress.      Resistance Training   Training Prescription Yes    Weight 3 lb    Reps 10-15             Perform Capillary Blood Glucose checks as needed.  Exercise Prescription  Changes:   Exercise Prescription Changes     Row Name 12/04/21 1200 12/24/21 0900 01/08/22 1200 01/21/22 1600 02/05/22 1400     Response to Exercise   Blood Pressure (Admit) 124/72 148/74 126/64 130/82 146/72   Blood Pressure (Exercise) 154/80 178/64 122/66 200/80 --   Blood Pressure (Exit) 128/76 106/54 122/70 150/78 132/82   Heart Rate (Admit) 93 bpm 104 bpm 110 bpm 81 bpm 85 bpm   Heart Rate (Exercise) 118 bpm 130 bpm 132 bpm 118 bpm 110 bpm   Heart Rate (Exit) 94 bpm 112 bpm 106 bpm 103 bpm 102 bpm   Oxygen Saturation (Admit) 98 % 95 % 97 % 98 % 96 %   Oxygen Saturation (Exercise) 95 % 95 % 99 % 96 % 98 %   Oxygen Saturation (Exit) 97 % 95 % 97 % 96 % 97 %   Rating of Perceived Exertion (Exercise) 11 14 13 13 12    Perceived Dyspnea (Exercise) 3 2 2 3  --   Symptoms SOB, fatigue SOB SOB SOB --   Comments walk test results 2nd full session -- -- --   Duration -- Progress to 30 minutes of  aerobic without signs/symptoms of physical distress Progress to 30 minutes of  aerobic without signs/symptoms of physical distress Continue with 30 min of aerobic exercise without signs/symptoms of physical distress. Continue with 30 min of aerobic exercise without signs/symptoms of physical distress.   Intensity -- THRR unchanged THRR unchanged THRR unchanged THRR unchanged     Progression  Progression -- Continue to progress workloads to maintain intensity without signs/symptoms of physical distress. Continue to progress workloads to maintain intensity without signs/symptoms of physical distress. Continue to progress workloads to maintain intensity without signs/symptoms of physical distress. Continue to progress workloads to maintain intensity without signs/symptoms of physical distress.   Average METs -- 3.2 2.75 3.01 4.4     Resistance Training   Training Prescription -- Yes Yes Yes Yes   Weight -- 3 lb 3 lb 3 lb 3 lb   Reps -- 10-15 10-15 10-15 10-15     Interval Training   Interval Training  -- No No No No     Treadmill   MPH -- 1.8 2 2  --   Grade -- 0 0 0 --   Minutes -- 15 15 15  --   METs -- 2.38 2.53 2.53 --     Recumbant Bike   Level -- 3 -- 3 3   Minutes -- 15 -- 15 15   METs -- 2.73 -- -- 5.2     NuStep   Level -- 5 3 3 3    Minutes -- 15 15 15 15    METs -- 4.4 3 38 3.6     REL-XR   Level -- -- -- 3 --   Minutes -- -- -- 15 --   METs -- -- -- 3.2 --     Home Exercise Plan   Plans to continue exercise at -- -- -- Home (comment)  walking, staff videos --   Frequency -- -- -- Add 2 additional days to program exercise sessions. --   Initial Home Exercises Provided -- -- -- 12/30/21 --     Oxygen   Maintain Oxygen Saturation -- 88% or higher 88% or higher 88% or higher --            Exercise Comments:   Exercise Comments     Row Name 12/17/21 0820 01/09/22 0756         Exercise Comments First full day of exercise!  Patient was oriented to gym and equipment including functions, settings, policies, and procedures.  Patient's individual exercise prescription and treatment plan were reviewed.  All starting workloads were established based on the results of the 6 minute walk test done at initial orientation visit.  The plan for exercise progression was also introduced and progression will be customized based on patient's performance and goals. Pt able to follow exercise prescription today without complaint.  Pt has been out due to cellulitis in leg. He stated his leg is some better and is able to exercise today at a lower level. Will continue to monitor for progression.               Exercise Goals and Review:   Exercise Goals     Row Name 12/04/21 1255             Exercise Goals   Increase Physical Activity Yes       Intervention Provide advice, education, support and counseling about physical activity/exercise needs.;Develop an individualized exercise prescription for aerobic and resistive training based on initial evaluation findings, risk  stratification, comorbidities and participant's personal goals.       Expected Outcomes Short Term: Attend rehab on a regular basis to increase amount of physical activity.;Long Term: Add in home exercise to make exercise part of routine and to increase amount of physical activity.;Long Term: Exercising regularly at least 3-5 days a week.       Increase  Strength and Stamina Yes       Intervention Provide advice, education, support and counseling about physical activity/exercise needs.;Develop an individualized exercise prescription for aerobic and resistive training based on initial evaluation findings, risk stratification, comorbidities and participant's personal goals.       Expected Outcomes Short Term: Increase workloads from initial exercise prescription for resistance, speed, and METs.;Short Term: Perform resistance training exercises routinely during rehab and add in resistance training at home;Long Term: Improve cardiorespiratory fitness, muscular endurance and strength as measured by increased METs and functional capacity (6MWT)       Able to understand and use rate of perceived exertion (RPE) scale Yes       Intervention Provide education and explanation on how to use RPE scale       Expected Outcomes Short Term: Able to use RPE daily in rehab to express subjective intensity level;Long Term:  Able to use RPE to guide intensity level when exercising independently       Able to understand and use Dyspnea scale Yes       Intervention Provide education and explanation on how to use Dyspnea scale       Expected Outcomes Short Term: Able to use Dyspnea scale daily in rehab to express subjective sense of shortness of breath during exertion;Long Term: Able to use Dyspnea scale to guide intensity level when exercising independently       Knowledge and understanding of Target Heart Rate Range (THRR) Yes       Intervention Provide education and explanation of THRR including how the numbers were predicted  and where they are located for reference       Expected Outcomes Short Term: Able to state/look up THRR;Long Term: Able to use THRR to govern intensity when exercising independently;Short Term: Able to use daily as guideline for intensity in rehab       Able to check pulse independently Yes       Intervention Provide education and demonstration on how to check pulse in carotid and radial arteries.;Review the importance of being able to check your own pulse for safety during independent exercise       Expected Outcomes Short Term: Able to explain why pulse checking is important during independent exercise;Long Term: Able to check pulse independently and accurately       Understanding of Exercise Prescription Yes       Intervention Provide education, explanation, and written materials on patient's individual exercise prescription       Expected Outcomes Short Term: Able to explain program exercise prescription;Long Term: Able to explain home exercise prescription to exercise independently                Exercise Goals Re-Evaluation :  Exercise Goals Re-Evaluation     Row Name 12/17/21 9629 12/24/21 0913 12/30/21 0845 01/08/22 1236 01/21/22 0757     Exercise Goal Re-Evaluation   Exercise Goals Review Able to understand and use rate of perceived exertion (RPE) scale;Able to understand and use Dyspnea scale;Knowledge and understanding of Target Heart Rate Range (THRR);Understanding of Exercise Prescription Increase Physical Activity;Increase Strength and Stamina Increase Physical Activity;Increase Strength and Stamina;Able to understand and use rate of perceived exertion (RPE) scale;Able to understand and use Dyspnea scale;Knowledge and understanding of Target Heart Rate Range (THRR);Able to check pulse independently;Understanding of Exercise Prescription Increase Physical Activity;Increase Strength and Stamina Increase Physical Activity;Increase Strength and Stamina   Comments Reviewed RPE and  dyspnea scales, THR and program prescription with pt today.  Pt voiced understanding and was given a copy of goals to take home. Trevell is doing well his first couple of sessions here in rehab. He has already increased to level 5 on the T4 Nustep and tolerating the rest of his exercise prescription. Will continue to monitor as he progresses throughout the program. Cloyd has been doing well in rehab and has even added in a third day.  He is already walking at home on his off days.  Reviewed home exercise with pt today.  Pt plans to continue walking at home and add in our staff videos for exercise.  Reviewed THR, pulse, RPE, sign and symptoms, pulse oximetery and when to call 911 or MD.  Also discussed weather considerations and indoor options.  Pt voiced understanding. Damond reaches [facility] during sessions.  He has missed some due to cellulitis in his leg.  We will continue to monitor progress. Maximiliano is walking and trying to do some stretches outside program sessions.  He hasnt tried [employer] yet.  he does monitor HR and O2 while exercising.   Expected Outcomes Short: Use RPE daily to regulate intensity. Long: Follow program prescription in THR. Short: Continue to build up tolerance on the treadmill Long: Continue to increase overall MET level Short: Start to add in videos on bad weather days Long: Continue to improe stamina. Short: get back to regular attendance Long: improve average MET level Short: maintain conisstent exercise Long: continue to build stamina    Row Name 02/05/22 1412             Exercise Goal Re-Evaluation   Exercise Goals Review Increase Physical Activity;Increase Strength and Stamina       Comments Timmie is doing well and has increased average MET level to 4.4.  he would benefi fromtryig 4lb for strength work.  Staff will monitor progress.       Expected Outcomes Short: try 4 lb Long:build overall stamina                Discharge Exercise Prescription (Final Exercise  Prescription Changes):  Exercise Prescription Changes - 02/05/22 1400       Response to Exercise   Blood Pressure (Admit) 146/72    Blood Pressure (Exit) 132/82    Heart Rate (Admit) 85 bpm    Heart Rate (Exercise) 110 bpm    Heart Rate (Exit) 102 bpm    Oxygen Saturation (Admit) 96 %    Oxygen Saturation (Exercise) 98 %    Oxygen Saturation (Exit) 97 %    Rating of Perceived Exertion (Exercise) 12    Duration Continue with 30 min of aerobic exercise without signs/symptoms of physical distress.    Intensity THRR unchanged      Progression   Progression Continue to progress workloads to maintain intensity without signs/symptoms of physical distress.    Average METs 4.4      Resistance Training   Training Prescription Yes    Weight 3 lb    Reps 10-15      Interval Training   Interval Training No      Recumbant Bike   Level 3    Minutes 15    METs 5.2      NuStep   Level 3    Minutes 15    METs 3.6             Nutrition:  Target Goals: Understanding of nutrition guidelines, daily intake of sodium <1549m, cholesterol <2028m calories 30% from fat and 7% or  less from saturated fats, daily to have 5 or more servings of fruits and vegetables.  Education: All About Nutrition: -Group instruction provided by verbal, written material, interactive activities, discussions, models, and posters to present general guidelines for heart healthy nutrition including fat, fiber, MyPlate, the role of sodium in heart healthy nutrition, utilization of the nutrition label, and utilization of this knowledge for meal planning. Follow up email sent as well. Written material given at graduation.   Biometrics:  Pre Biometrics - 12/04/21 1245       Pre Biometrics   Height 6' 2.5" (1.892 m)    Weight 238 lb 3.2 oz (108 kg)    BMI (Calculated) 30.18    Single Leg Stand --   declined             Nutrition Therapy Plan and Nutrition Goals:  Nutrition Therapy & Goals - 12/31/21  0843       Nutrition Therapy   Diet Heart healthy, low Na, pulmonary MNT    Drug/Food Interactions Statins/Certain Fruits    Protein (specify units) 100-110g    Fiber 30 grams    Whole Grain Foods 3 servings    Saturated Fats 16 max. grams    Fruits and Vegetables 8 servings/day    Sodium 2 grams      Personal Nutrition Goals   Nutrition Goal ST: prepping his chicken beforehand or cutting vegetables as this is time consuming and can be more versatile, prepping and freezing his breakfast  LT: prepare meals more efficiently to reduce fatigue, limit Na <2g/day, limit saturated fat <16g/day    Comments 61 y.o. M admitted to pulmonary rehab for asthma also presenting with COPD, HTN, stg 3 CKD, osteoporosis, lupus. Relevant medications include vit D, iron, MVI, prednisone, crestor, zoloft, cellcept. PYP 44.  Vegetables & Fruits 6/12. Breads, Grains & Cereals 6/12. Red & Processed Meat 4/12. Poultry 2/2. Fish & Shellfish 1/4. Beans, Nuts & Seeds 3/4. Milk & Dairy Foods 3/6. Toppings, Oils, Seasonings & Salt 6/20. Sweets, Snacks & Restaurant Food 5/14. Beverages 8/10. B: sometimes: bagel sandwich or cream cheese or breakfast sandwich. L: vaires: tries for a bigger meal at lunch. Soup and sandwich or just soup or sandwich D: Protein and vegetables (mostly starchy vegetables). Drinks: "too much soda" - diet Snacks: m&ms, chips, crackers with peanut butter. Kaidyn reports his diet is not the best and that he should make changes like not having sausage so often. Nephtali feels that chocolate and soda are his weaknessess. He feels his major barrier is his energy. His good days he is a 6 or 7,, but it is rare (maybe once every couple of weeks). Normally he is at a 1-2 for energy. . Discussed heart healthy eating and pulmonary MNT. Discussed ingredient prepping - he has tried meal prepping and freezer meals before but it just went to waste. Suggested starting slowly with prepping his chicken beforehand or cutting  vegetables as this is time consuming and can be more versatile to prevent food waste. Also suggested prepping his breakfast and freezing it week to week as he eats close to the same thing daily.      Intervention Plan   Intervention Prescribe, educate and counsel regarding individualized specific dietary modifications aiming towards targeted core components such as weight, hypertension, lipid management, diabetes, heart failure and other comorbidities.    Expected Outcomes Short Term Goal: Understand basic principles of dietary content, such as calories, fat, sodium, cholesterol and nutrients.;Short Term Goal: A plan has  been developed with personal nutrition goals set during dietitian appointment.;Long Term Goal: Adherence to prescribed nutrition plan.             Nutrition Assessments:  MEDIFICTS Score Key: ?70 Need to make dietary changes  40-70 Heart Healthy Diet ? 40 Therapeutic Level Cholesterol Diet  Flowsheet Row Pulmonary Rehab from 12/04/2021 in [facility] Cardiac and Pulmonary Rehab  Picture Your Plate Total Score on Admission 44      Picture Your Plate Scores: <74 Unhealthy dietary pattern with much room for improvement. 41-50 Dietary pattern unlikely to meet recommendations for good health and room for improvement. 51-60 More healthful dietary pattern, with some room for improvement.  >60 Healthy dietary pattern, although there may be some specific behaviors that could be improved.   Nutrition Goals Re-Evaluation:  Nutrition Goals Re-Evaluation     Row Name 12/30/21 0852 01/21/22 0759           Goals   Nutrition Goal Need nutrition appt --      Comment Deklin needs to schedule appt with dietitian Jaicob has been meal prepping and it is going well so far.  He is reading labels to monitor sodium and fat.      Expected Outcome Meet with dietitian Short: continue to meal prep Long:  maintain RD recommendations               Nutrition Goals Discharge (Final Nutrition  Goals Re-Evaluation):  Nutrition Goals Re-Evaluation - 01/21/22 0759       Goals   Comment Torrie has been meal prepping and it is going well so far.  He is reading labels to monitor sodium and fat.    Expected Outcome Short: continue to meal prep Long:  maintain RD recommendations             Psychosocial: Target Goals: Acknowledge presence or absence of significant depression and/or stress, maximize coping skills, provide positive support system. Participant is able to verbalize types and ability to use techniques and skills needed for reducing stress and depression.   Education: Stress, Anxiety, and Depression - Group verbal and visual presentation to define topics covered.  Reviews how body is impacted by stress, anxiety, and depression.  Also discusses healthy ways to reduce stress and to treat/manage anxiety and depression.  Written material given at graduation.   Education: Sleep Hygiene -Provides group verbal and written instruction about how sleep can affect your health.  Define sleep hygiene, discuss sleep cycles and impact of sleep habits. Review good sleep hygiene tips.    Initial Review & Psychosocial Screening:  Initial Psych Review & Screening - 10/23/21 1539       Initial Review   Current issues with Current Psychotropic Meds;Current Sleep Concerns;Current Anxiety/Panic      Family Dynamics   Good Support System? No    Concerns No support system    Comments His family has mostly moved away and he does not have a good support system.      Barriers   Psychosocial barriers to participate in program The patient should benefit from training in stress management and relaxation.;There are no identifiable barriers or psychosocial needs.      Screening Interventions   Interventions Encouraged to exercise;To provide support and resources with identified psychosocial needs;Provide feedback about the scores to participant    Expected Outcomes Short Term goal: Utilizing  psychosocial counselor, staff and physician to assist with identification of specific Stressors or current issues interfering with healing process. Setting desired goal  for each stressor or current issue identified.;Long Term Goal: Stressors or current issues are controlled or eliminated.;Short Term goal: Identification and review with participant of any Quality of Life or Depression concerns found by scoring the questionnaire.;Long Term goal: The participant improves quality of Life and PHQ9 Scores as seen by post scores and/or verbalization of changes             Quality of Life Scores:  Scores of 19 and below usually indicate a poorer quality of life in these areas.  A difference of  2-3 points is a clinically meaningful difference.  A difference of 2-3 points in the total score of the Quality of Life Index has been associated with significant improvement in overall quality of life, self-image, physical symptoms, and general health in studies assessing change in quality of life.  PHQ-9: Recent Review Flowsheet Data     Depression screen [facility] 2/9 02/03/2022 12/30/2021 12/04/2021 10/07/2021 10/02/2021   Decreased Interest 1 0 1 0 1   Down, Depressed, Hopeless 1 0 1 0 1   PHQ - 2 Score 2 0 2 0 2   Altered sleeping 2 1 2  0 2   Tired, decreased energy 3 3 3  0 3   Change in appetite 3 0 3 0 2   Feeling bad or failure about yourself  0 0 0 0 2   Trouble concentrating 1 1 1  0 2   Moving slowly or fidgety/restless 1 0 1 0 1   Suicidal thoughts 0 0 0 0 0   PHQ-9 Score 12 5 12  0 14   Difficult doing work/chores Very difficult Somewhat difficult Somewhat difficult Not difficult at all Somewhat difficult      Interpretation of Total Score  Total Score Depression Severity:  1-4 = Minimal depression, 5-9 = Mild depression, 10-14 = Moderate depression, 15-19 = Moderately severe depression, 20-27 = Severe depression   Psychosocial Evaluation and Intervention:  Psychosocial Evaluation - 10/23/21  1541       Psychosocial Evaluation & Interventions   Interventions Encouraged to exercise with the program and follow exercise prescription;Relaxation education;Stress management education    Comments His family has mostly moved away and he does not have a good support system.    Expected Outcomes Short: Start [facility] to help with mood. Long: Maintain a healthy mental state.    Continue Psychosocial Services  Follow up required by staff             Psychosocial Re-Evaluation:  Psychosocial Re-Evaluation     [place] Name 12/30/21 0849 01/21/22 0754 02/03/22 0826         Psychosocial Re-Evaluation   Current issues with Current Psychotropic Meds;Current Sleep Concerns;Current Stress Concerns;Current Depression Current Psychotropic Meds;Current Sleep Concerns;Current Depression History of Depression;Current Depression;Current Psychotropic Meds     Comments Reviewed patient health questionnaire (PHQ-9) with patient for follow up. Previously, patients score indicated signs/symptoms of depression.  Reviewed to see if patient is improving symptom wise while in program.  Score improved2 and patient states that it is because he has been moving more and feeling better.  His biggest source of stress is his healthy and lupus flare ups.  Besides his lupus keeping him up, he also has a difficult going to sleep at night.  One of his biggest complaints is that he is not sleeping well so that he is fatigued a lot. Given his sleep issues, we sent him some sleep information to review and watch. Sleep is still not so good.  He has been on prednisone and lasy dose is tomorrow.  He has talked with his Dr about sleep.  He goes for walks and listens to music to help with stress. Reviewed patient health questionnaire (PHQ-9) with patient for follow up. Previously, patients score indicated signs/symptoms of depression.  Reviewed to see if patient is improving symptom wise while in program.  Score declined and patient  states that it is because he has not worked since June. He is still having back pain and breathing issues. He is working hard and is determinded to get better.     Expected Outcomes Short: Continue to exericse for mental boost and review sleep info Long; Conitnue to manage lupus Short:  continue ot exercise and walk to manage stress Long: keep stress low long term Short: Continue to work toward an improvement in PHQ9 scores by attending [facility] regularly. Long: Continue to improve stress and depression coping skills by talking with staff and attending [facility] regularly and work toward a positive mental state.     Interventions Encouraged to attend Pulmonary Rehabilitation for the exercise;Stress management education -- Encouraged to attend Pulmonary Rehabilitation for the exercise     Continue Psychosocial Services  Follow up required by staff -- Follow up required by staff              Psychosocial Discharge (Final Psychosocial Re-Evaluation):  Psychosocial Re-Evaluation - 02/03/22 0826       Psychosocial Re-Evaluation   Current issues with History of Depression;Current Depression;Current Psychotropic Meds    Comments Reviewed patient health questionnaire (PHQ-9) with patient for follow up. Previously, patients score indicated signs/symptoms of depression.  Reviewed to see if patient is improving symptom wise while in program.  Score declined and patient states that it is because he has not worked since June. He is still having back pain and breathing issues. He is working hard and is determinded to get better.    Expected Outcomes Short: Continue to work toward an improvement in [place] scores by attending [facility] regularly. Long: Continue to improve stress and depression coping skills by talking with staff and attending [facility] regularly and work toward a positive mental state.    Interventions Encouraged to attend Pulmonary Rehabilitation for the exercise    Continue Psychosocial  Services  Follow up required by staff             Education: Education Goals: Education classes will be provided on a weekly basis, covering required topics. Participant will state understanding/return demonstration of topics presented.  Learning Barriers/Preferences:  Learning Barriers/Preferences - 10/23/21 1538       Learning Barriers/Preferences   Learning Barriers None    Learning Preferences None             General Pulmonary Education Topics:  Infection Prevention: - Provides verbal and written material to individual with discussion of infection control including proper hand washing and proper equipment cleaning during exercise session. Flowsheet Row Pulmonary Rehab from 10/23/2021 in [facility] Cardiac and Pulmonary Rehab  Date 10/23/21  Educator [facility]  Instruction Review Code 1- Verbalizes Understanding       Falls Prevention: - Provides verbal and written material to individual with discussion of falls prevention and safety. Flowsheet Row Pulmonary Rehab from 10/23/2021 in [facility] Cardiac and Pulmonary Rehab  Date 10/23/21  Educator [facility]  Instruction Review Code 1- Verbalizes Understanding       Chronic Lung Disease Review: - Group verbal instruction with posters, models, PowerPoint presentations and videos,  to review  new updates, new respiratory medications, new advancements in procedures and treatments. Providing information on websites and "800" numbers for continued self-education. Includes information about supplement oxygen, available portable oxygen systems, continuous and intermittent flow rates, oxygen safety, concentrators, and Medicare reimbursement for oxygen. Explanation of Pulmonary Drugs, including class, frequency, complications, importance of spacers, rinsing mouth after steroid MDI's, and proper cleaning methods for nebulizers. Review of basic lung anatomy and physiology related to function, structure, and complications of lung disease. Review of risk  factors. Discussion about methods for diagnosing sleep apnea and types of masks and machines for OSA. Includes a review of the use of types of environmental controls: home humidity, furnaces, filters, dust mite/pet prevention, HEPA vacuums. Discussion about weather changes, air quality and the benefits of nasal washing. Instruction on Warning signs, infection symptoms, calling MD promptly, preventive modes, and value of vaccinations. Review of effective airway clearance, coughing and/or vibration techniques. Emphasizing that all should Create an Action Plan. Written material given at graduation. Flowsheet Row Pulmonary Rehab from 01/30/2022 in [facility] Cardiac and Pulmonary Rehab  Date 01/30/22  Educator [facility]  Instruction Review Code 1- Verbalizes Understanding       AED/CPR: - Group verbal and written instruction with the use of models to demonstrate the basic use of the AED with the basic ABC's of resuscitation.    Anatomy and Cardiac Procedures: - Group verbal and visual presentation and models provide information about basic cardiac anatomy and function. Reviews the testing methods done to diagnose heart disease and the outcomes of the test results. Describes the treatment choices: Medical Management, Angioplasty, or Coronary Bypass Surgery for treating various heart conditions including Myocardial Infarction, Angina, Valve Disease, and Cardiac Arrhythmias.  Written material given at graduation. Flowsheet Row Pulmonary Rehab from 01/30/2022 in [facility] Cardiac and Pulmonary Rehab  Date 12/26/21  Educator SB  Instruction Review Code 1- Verbalizes Understanding       Medication Safety: - Group verbal and visual instruction to review commonly prescribed medications for heart and lung disease. Reviews the medication, class of the drug, and side effects. Includes the steps to properly store meds and maintain the prescription regimen.  Written material given at graduation. Flowsheet Row Pulmonary Rehab  from 01/30/2022 in [facility] Cardiac and Pulmonary Rehab  Date 01/09/22  Educator SB  Instruction Review Code 1- Verbalizes Understanding       Other: -Provides group and verbal instruction on various topics (see comments)   Knowledge Questionnaire Score:  Knowledge Questionnaire Score - 12/04/21 1237       Knowledge Questionnaire Score   Pre Score 14/18: Lung disease, Oxygen, Exercise              Core Components/Risk Factors/Patient Goals at Admission:  Personal Goals and Risk Factors at Admission - 12/04/21 1255       Core Components/Risk Factors/Patient Goals on Admission    Weight Management Yes;Weight Loss    Intervention Weight Management: Develop a combined nutrition and exercise program designed to reach desired caloric intake, while maintaining appropriate intake of nutrient and fiber, sodium and fats, and appropriate energy expenditure required for the weight goal.;Weight Management: Provide education and appropriate resources to help participant work on and attain dietary goals.;Weight Management/Obesity: Establish reasonable short term and long term weight goals.;Obesity: Provide education and appropriate resources to help participant work on and attain dietary goals.    Admit Weight 238 lb (108 kg)    Goal Weight: Short Term 233 lb (105.7 kg)    Goal Weight: Long  Term 215 lb (97.5 kg)    Expected Outcomes Short Term: Continue to assess and modify interventions until short term weight is achieved;Long Term: Adherence to nutrition and physical activity/exercise program aimed toward attainment of established weight goal;Weight Loss: Understanding of general recommendations for a balanced deficit meal plan, which promotes 1-2 lb weight loss per week and includes a negative energy balance of [phone] kcal/d;Understanding recommendations for meals to include 15-35% energy as protein, 25-35% energy from fat, 35-60% energy from carbohydrates, less than 257m of dietary cholesterol,  20-35 gm of total fiber daily;Understanding of distribution of calorie intake throughout the day with the consumption of 4-5 meals/snacks    Improve shortness of breath with ADL's Yes    Intervention Provide education, individualized exercise plan and daily activity instruction to help decrease symptoms of SOB with activities of daily living.    Expected Outcomes Short Term: Improve cardiorespiratory fitness to achieve a reduction of symptoms when performing ADLs;Long Term: Be able to perform more ADLs without symptoms or delay the onset of symptoms    Hypertension Yes    Intervention Provide education on lifestyle modifcations including regular physical activity/exercise, weight management, moderate sodium restriction and increased consumption of fresh fruit, vegetables, and low fat dairy, alcohol moderation, and smoking cessation.;Monitor prescription use compliance.    Expected Outcomes Short Term: Continued assessment and intervention until BP is < 140/960mHG in hypertensive participants. < 130/8044mG in hypertensive participants with diabetes, heart failure or chronic kidney disease.;Long Term: Maintenance of blood pressure at goal levels.    Lipids Yes    Intervention Provide education and support for participant on nutrition & aerobic/resistive exercise along with prescribed medications to achieve LDL <87m66mDL >40mg89m Expected Outcomes Short Term: Participant states understanding of desired cholesterol values and is compliant with medications prescribed. Participant is following exercise prescription and nutrition guidelines.;Long Term: Cholesterol controlled with medications as prescribed, with individualized exercise RX and with personalized nutrition plan. Value goals: LDL < 87mg,6m > 40 mg.             Education:Diabetes - Individual verbal and written instruction to review signs/symptoms of diabetes, desired ranges of glucose level fasting, after meals and with exercise.  Acknowledge that pre and post exercise glucose checks will be done for 3 sessions at entry of program.   Know Your Numbers and Heart Failure: - Group verbal and visual instruction to discuss disease risk factors for cardiac and pulmonary disease and treatment options.  Reviews associated critical values for Overweight/Obesity, Hypertension, Cholesterol, and Diabetes.  Discusses basics of heart failure: signs/symptoms and treatments.  Introduces Heart Failure Zone chart for action plan for heart failure.  Written material given at graduation.   Core Components/Risk Factors/Patient Goals Review:   Goals and Risk Factor Review     Row Name 12/30/21 0853 01/21/22 0748           Core Components/Risk Factors/Patient Goals Review   Personal Goals Review Weight Management/Obesity;Hypertension;Lipids;Improve shortness of breath with ADL's Weight Management/Obesity;Hypertension;Improve shortness of breath with ADL's      Review Jarrett Lennarding well in rehab.  His weight goes up and down and is fairly steady.  However, he did gain weight over the holidays and is trying to get it back off again.  HIs pressures are also up and down and he does keep any eye on them at home. He is doing okay with his breathing, he has good days and bad days.  He does use  his PLB. Jaythen's weight still fluctuates some.  He has been on prednisone for back pain.  His BP fluctuates some as well.  he had a high reading at [facility] yesterday but it went back down. 148/90 resting today.  Breathing has still been an issue - he continues to use PLB.      Expected Outcomes Short: Continue to work on breathing Long: Continue to monitor risk factors. Short: continue to monitor BP and follow up with Dr if it stays high Long:manage risk factors long term               Core Components/Risk Factors/Patient Goals at Discharge (Final Review):   Goals and Risk Factor Review - 01/21/22 0748       Core Components/Risk Factors/Patient Goals Review    Personal Goals Review Weight Management/Obesity;Hypertension;Improve shortness of breath with ADL's    Review Loys's weight still fluctuates some.  He has been on prednisone for back pain.  His BP fluctuates some as well.  he had a high reading at [facility] yesterday but it went back down. 148/90 resting today.  Breathing has still been an issue - he continues to use PLB.    Expected Outcomes Short: continue to monitor BP and follow up with Dr if it stays high Long:manage risk factors long term             ITP Comments:  ITP Comments     Row Name 10/23/21 1537 12/04/21 1229 12/10/21 1527 12/17/21 0820 12/18/21 0731   ITP Comments Virtual Visit completed. Patient informed on EP and RD appointment and 6 Minute walk test. Patient also informed of patient health questionnaires on My Chart. Patient Verbalizes understanding. Visit diagnosis can be found in [facility] 09/15/2021. Completed 6MWT and gym orientation. Initial ITP created and sent for review to Dr. [name], Medical Director. Michelle had to delay his 1st day due to falling and wanted to get evaluated for possible injuries. Spoke with patient today who stated he went to Urgent Care and got X Rays and was told nothing was broken. He feels better overall and is planning to start back next week, 12/27 when he returns from out of town. First full day of exercise!  Patient was oriented to gym and equipment including functions, settings, policies, and procedures.  Patient's individual exercise prescription and treatment plan were reviewed.  All starting workloads were established based on the results of the 6 minute walk test done at initial orientation visit.  The plan for exercise progression was also introduced and progression will be customized based on patient's performance and goals. 30 Day review completed. Medical Director ITP review done, changes made as directed, and signed approval by Medical Director.    Row Name 12/31/21 [phone] 01/09/22 0755  01/15/22 0900 02/12/22 0842     ITP Comments Completed initial RD consultation Pt able to follow exercise prescription today without complaint.  Pt has been out due to cellulitis in leg. He stated his leg is some better and is able to exercise today at a lower level. Will continue to monitor for progression. 30 Day review completed. Medical Director ITP review done, changes made as directed, and signed approval by Medical Director. 30 Day review completed. Medical Director ITP review done, changes made as directed, and signed approval by Medical Director.             Comments:

## 2022-02-13 ENCOUNTER — Telehealth: Payer: Self-pay

## 2022-02-13 ENCOUNTER — Telehealth (INDEPENDENT_AMBULATORY_CARE_PROVIDER_SITE_OTHER): Payer: Self-pay | Admitting: *Deleted

## 2022-02-13 ENCOUNTER — Telehealth: Payer: Self-pay | Admitting: Family Medicine

## 2022-02-13 NOTE — Telephone Encounter (Signed)
Referral Request - Has patient seen PCP for this complaint? yes *If NO, is insurance requiring patient see PCP for this issue before PCP can refer them? 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

## 2022-02-13 NOTE — Telephone Encounter (Signed)
Patient is set to have his laser ablation 03/07/22 and he is aware of the appointment. Information packet mailed today.

## 2022-02-13 NOTE — Telephone Encounter (Signed)
MB is not setup

## 2022-02-13 NOTE — Telephone Encounter (Signed)
Jonathon Snow left a message that he won't be here today due to back pain, his MD told him to rest it for a week; he hopes to return Monday. He also mentioned he may need a second surgery.

## 2022-02-20 ENCOUNTER — Telehealth: Payer: Self-pay | Admitting: *Deleted

## 2022-02-20 ENCOUNTER — Other Ambulatory Visit: Payer: Self-pay

## 2022-02-20 ENCOUNTER — Encounter: Payer: 59 | Attending: Pulmonary Disease

## 2022-02-20 DIAGNOSIS — J45909 Unspecified asthma, uncomplicated: Secondary | ICD-10-CM | POA: Diagnosis not present

## 2022-02-20 DIAGNOSIS — Z5189 Encounter for other specified aftercare: Secondary | ICD-10-CM | POA: Diagnosis not present

## 2022-02-20 NOTE — Progress Notes (Signed)
Daily Session Note ? ?Patient Details  ?Name: Jonathon Snow ?MRN: [id number] ?Date of Birth: 1961-01-29 ?Referring Provider:   ?Flowsheet Row Pulmonary Rehab from 12/04/2021 in [facility] Cardiac and Pulmonary Rehab  ?Referring Provider [name] MD  ? ?  ? ? ?Encounter Date: 02/20/2022 ? ?Check In: ? Session Check In - 02/20/22 0740   ? ?  ? Check-In  ? Supervising physician immediately available to respond to emergencies See telemetry face sheet for immediately available ER MD   ? Location [facility]-Cardiac & Pulmonary Rehab   ? Staff Present Birdie Sons, MPA, RN;Amanda Sommer, BA, ACSM CEP, Exercise Physiologist;Jessica Luan Pulling, MA, RCEP, CCRP, CCET   ? Virtual Visit No   ? Medication changes reported     No   ? Fall or balance concerns reported    No   ? Warm-up and Cool-down Performed on first and last piece of equipment   ? Resistance Training Performed Yes   ? VAD Patient? No   ? PAD/SET Patient? No   ?  ? Pain Assessment  ? Currently in Pain? No/denies   ? ?  ?  ? ?  ? ? ? ? ? ?Social History  ? ?Tobacco Use  ?Smoking Status Never  ?Smokeless Tobacco Never  ? ? ?Goals Met:  ?Independence with exercise equipment ?Exercise tolerated well ?No report of concerns or symptoms today ?Strength training completed today ? ?Goals Unmet:  ?Not Applicable ? ?Comments: Pt able to follow exercise prescription today without complaint.  Will continue to monitor for progression. ? ? ? ?Dr. Emily Filbert is Medical Director for [place].  ?Dr. [name] is Medical Director for [facility] Pulmonary Rehabilitation. ?

## 2022-02-20 NOTE — Telephone Encounter (Signed)
Patient needs to reschedule tomorrows appointment. °

## 2022-02-21 ENCOUNTER — Telehealth: Payer: Self-pay | Admitting: Family Medicine

## 2022-02-21 ENCOUNTER — Other Ambulatory Visit: Payer: 59

## 2022-02-21 ENCOUNTER — Encounter: Payer: 59 | Admitting: Internal Medicine

## 2022-02-21 NOTE — Telephone Encounter (Signed)
Oh okay I do see that so sorry for that. Thank you for getting it fixed for [place]. ?

## 2022-02-21 NOTE — Telephone Encounter (Signed)
Good morning Kristeen Miss, so Roselyn Reef had put in a referral for him on 02/07/22 and under comments it states he wanted it sent to [facility]. Can we get this fixed? ? 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 ?Has the referral been discussed with the patient?: yes  ? ?Designated contact for the referral if not the patient (name/phone number):  ? ?Has the patient seen a specialist for this issue before?:yes  ? If so, who (practice/provider)?  ? ?Does the patient have a provider or location preference for the referral?:[facility], currently goes but ins needs new referral placed  ?Would the patient like to see previous specialist if applicable? The referral can be faxed to [place] ?

## 2022-02-21 NOTE — Telephone Encounter (Signed)
Copied from [place] [id number]. Topic: Referral - Request for Referral ?>> Feb 21, 2022  8:52 AM Leward Quan A wrote: ?Has patient seen PCP for this complaint? Yes.   ?*If NO, is insurance requiring patient see PCP for this issue before PCP can refer them? ?Referral for which specialty: Hematology  ?Preferred provider/office: [facility]  ?Reason for referral: need new referral to see a specialist ?

## 2022-02-24 ENCOUNTER — Other Ambulatory Visit: Payer: Self-pay

## 2022-02-24 ENCOUNTER — Telehealth: Payer: Self-pay | Admitting: Family Medicine

## 2022-02-24 ENCOUNTER — Encounter: Payer: 59 | Admitting: *Deleted

## 2022-02-24 DIAGNOSIS — Z5189 Encounter for other specified aftercare: Secondary | ICD-10-CM | POA: Diagnosis not present

## 2022-02-24 DIAGNOSIS — J45909 Unspecified asthma, uncomplicated: Secondary | ICD-10-CM

## 2022-02-24 NOTE — Telephone Encounter (Signed)
Contacted pt to inform him and he states he actually wants to stay with the one here in [place], his appointment is already scheduled. ?

## 2022-02-24 NOTE — Telephone Encounter (Signed)
Referral has been fixed. T ?

## 2022-02-24 NOTE — Progress Notes (Signed)
Daily Session Note ? ?Patient Details  ?Name: Jonathon Snow ?MRN: [id number] ?Date of Birth: 09-29-1961 ?Referring Provider:   ?Flowsheet Row Pulmonary Rehab from 12/04/2021 in [facility] Cardiac and Pulmonary Rehab  ?Referring Provider [name] MD  ? ?  ? ? ?Encounter Date: 02/24/2022 ? ?Check In: ? Session Check In - 02/24/22 0837   ? ?  ? Check-In  ? Supervising physician immediately available to respond to emergencies See telemetry face sheet for immediately available ER MD   ? Location [facility]-Cardiac & Pulmonary Rehab   ? Staff Present Heath Lark, RN, BSN, CCRP;Joseph [place], RCP,RRT,BSRT;Kelly [place], [place], ACSM CEP, Exercise Physiologist   ? Virtual Visit No   ? Medication changes reported     No   ? Fall or balance concerns reported    No   ? Warm-up and Cool-down Performed on first and last piece of equipment   ? Resistance Training Performed Yes   ? VAD Patient? No   ? PAD/SET Patient? No   ?  ? Pain Assessment  ? Currently in Pain? No/denies   ? ?  ?  ? ?  ? ? ? ? ? ?Social History  ? ?Tobacco Use  ?Smoking Status Never  ?Smokeless Tobacco Never  ? ? ?Goals Met:  ?Proper associated with RPD/PD & O2 Sat ?Independence with exercise equipment ?Exercise tolerated well ?No report of concerns or symptoms today ? ?Goals Unmet:  ?Not Applicable ? ?Comments: Pt able to follow exercise prescription today without complaint.  Will continue to monitor for progression. ? ? ? ?Dr. Emily Filbert is Medical Director for [place].  ?Dr. [name] is Medical Director for [facility] Pulmonary Rehabilitation. ?

## 2022-02-24 NOTE — Telephone Encounter (Signed)
Referral Request - Has patient seen PCP for this complaint? yes ?*If NO, is insurance requiring patient see PCP for this issue before PCP can refer them? ?Referral for which specialty: hemotologist ?[facility] provider[facility]office[facility] ?Reason for referral: henry from [place] tell me ?

## 2022-02-25 ENCOUNTER — Telehealth: Payer: Self-pay

## 2022-02-25 ENCOUNTER — Other Ambulatory Visit: Payer: Self-pay

## 2022-02-25 DIAGNOSIS — Z5189 Encounter for other specified aftercare: Secondary | ICD-10-CM | POA: Diagnosis not present

## 2022-02-25 DIAGNOSIS — J45909 Unspecified asthma, uncomplicated: Secondary | ICD-10-CM

## 2022-02-25 NOTE — Progress Notes (Signed)
Daily Session Note ? ?Patient Details  ?Name: Jonathon Snow ?MRN: [id number] ?Date of Birth: Oct 25, 1961 ?Referring Provider:   ?Flowsheet Row Pulmonary Rehab from 12/04/2021 in [facility] Cardiac and Pulmonary Rehab  ?Referring Provider [name] MD  ? ?  ? ? ?Encounter Date: 02/25/2022 ? ?Check In: ? Session Check In - 02/25/22 0743   ? ?  ? Check-In  ? Supervising physician immediately available to respond to emergencies See telemetry face sheet for immediately available ER MD   ? Location [facility]-Cardiac & Pulmonary Rehab   ? Staff Present Birdie Sons, MPA, RN;Jessica [place], MA, RCEP, CCRP, CCET;Amanda Sommer, BA, ACSM CEP, Exercise Physiologist   ? Virtual Visit No   ? Medication changes reported     No   ? Fall or balance concerns reported    No   ? Warm-up and Cool-down Performed on first and last piece of equipment   ? Resistance Training Performed Yes   ? VAD Patient? No   ? PAD/SET Patient? No   ?  ? Pain Assessment  ? Currently in Pain? No/denies   ? ?  ?  ? ?  ? ? ? ? ? ?Social History  ? ?Tobacco Use  ?Smoking Status Never  ?Smokeless Tobacco Never  ? ? ?Goals Met:  ?Independence with exercise equipment ?Exercise tolerated well ?Personal goals reviewed ?No report of concerns or symptoms today ?Strength training completed today ? ?Goals Unmet:  ?Not Applicable ? ?Comments: Pt able to follow exercise prescription today without complaint.  Will continue to monitor for progression. ? ? ? ?Dr. Emily Filbert is Medical Director for [place].  ?Dr. [name] is Medical Director for [facility] Pulmonary Rehabilitation. ?

## 2022-02-25 NOTE — Telephone Encounter (Signed)
Called Jonathon Snow back, number can not be completed as dialed. On 02/24/22 spoke to patient about his hematologist referral and he states he wants to be seen here local at [facility] center, which he already has an appointment with them. He denied going to [facility] for a referral since its further for him. ?

## 2022-02-25 NOTE — Telephone Encounter (Signed)
Copied from [place] [id number]. Topic: Referral - Request for Referral ?>> Feb 25, 2022  9:34 AM Tessa Lerner A wrote: ?Reason for CRM: Jonathon Snow with [facility] has called to initiate a referral for the patient  ? ?Has patient seen PCP for this complaint? Yes.   ?*If NO, is insurance requiring patient see PCP for this issue before PCP can refer them? ?Referral for which specialty: Hematology  ?Preferred provider/office: Dr. Paralee Cancel - [facility]  ?Reason for referral: patient would like to see a specialist ?

## 2022-02-25 NOTE — Telephone Encounter (Signed)
Referral had already been placed for Dr.Thomas but pt stated he wanted to stay here local in [facility] and he had already an appointment set on 03/04/22. ?

## 2022-02-25 NOTE — Telephone Encounter (Signed)
Patient called back to speak to Jenny Reichmann can be reached at Ph#  [phone] ?

## 2022-02-25 NOTE — Telephone Encounter (Signed)
Reached out to patient and he states he will reach out to Dr.Ortel office to get scheduled for an appointment. Referral for that Dr. Has been placed already. ?

## 2022-02-25 NOTE — Telephone Encounter (Signed)
Called Jonathon Snow back unable to leave vm to call back. Called pt to speak with him about this referral no answer left him a vm to call back, since he stated to me over the phone the day before yesterday that he wanted to stay local for his hematologist referral.  ?

## 2022-02-27 ENCOUNTER — Telehealth: Payer: Self-pay

## 2022-02-27 ENCOUNTER — Other Ambulatory Visit: Payer: Self-pay

## 2022-02-27 DIAGNOSIS — Z5189 Encounter for other specified aftercare: Secondary | ICD-10-CM | POA: Diagnosis not present

## 2022-02-27 DIAGNOSIS — J45909 Unspecified asthma, uncomplicated: Secondary | ICD-10-CM

## 2022-02-27 NOTE — Progress Notes (Signed)
Daily Session Note ? ?Patient Details  ?Name: Jonathon Snow ?MRN: [id number] ?Date of Birth: 04-Jul-1961 ?Referring Provider:   ?Flowsheet Row Pulmonary Rehab from 12/04/2021 in [facility] Cardiac and Pulmonary Rehab  ?Referring Provider [name] MD  ? ?  ? ? ?Encounter Date: 02/27/2022 ? ?Check In: ? Session Check In - 02/27/22 0746   ? ?  ? Check-In  ? Supervising physician immediately available to respond to emergencies See telemetry face sheet for immediately available ER MD   ? Location [facility]-Cardiac & Pulmonary Rehab   ? Staff Present Birdie Sons, MPA, RN;Jessica Luan Pulling, MA, RCEP, CCRP, CCET   ? Virtual Visit No   ? Medication changes reported     No   ? Fall or balance concerns reported    No   ? Warm-up and Cool-down Performed on first and last piece of equipment   ? Resistance Training Performed Yes   ? VAD Patient? No   ? PAD/SET Patient? No   ?  ? Pain Assessment  ? Currently in Pain? No/denies   ? ?  ?  ? ?  ? ? ? ? ? ?Social History  ? ?Tobacco Use  ?Smoking Status Never  ?Smokeless Tobacco Never  ? ? ?Goals Met:  ?Independence with exercise equipment ?Exercise tolerated well ?No report of concerns or symptoms today ?Strength training completed today ? ?Goals Unmet:  ?Not Applicable ? ?Comments: Pt able to follow exercise prescription today without complaint.  Will continue to monitor for progression. ? ? ? ?Dr. Emily Filbert is Medical Director for [place].  ?Dr. [name] is Medical Director for [facility] Pulmonary Rehabilitation. ?

## 2022-02-27 NOTE — Telephone Encounter (Signed)
Patient called back and I told him the reason I was calling was because of his new patient appointment. Patient states he know where we are located, about the appointment date and time. Patient does not have any questions or concerns at the moment.  ?

## 2022-03-03 ENCOUNTER — Other Ambulatory Visit (INDEPENDENT_AMBULATORY_CARE_PROVIDER_SITE_OTHER): Payer: Self-pay | Admitting: Nurse Practitioner

## 2022-03-03 ENCOUNTER — Other Ambulatory Visit: Payer: Self-pay

## 2022-03-03 ENCOUNTER — Telehealth: Payer: Self-pay | Admitting: *Deleted

## 2022-03-03 ENCOUNTER — Other Ambulatory Visit: Payer: Self-pay | Admitting: Family Medicine

## 2022-03-03 ENCOUNTER — Encounter: Payer: 59 | Admitting: *Deleted

## 2022-03-03 DIAGNOSIS — J302 Other seasonal allergic rhinitis: Secondary | ICD-10-CM

## 2022-03-03 DIAGNOSIS — J45909 Unspecified asthma, uncomplicated: Secondary | ICD-10-CM

## 2022-03-03 DIAGNOSIS — Z5189 Encounter for other specified aftercare: Secondary | ICD-10-CM | POA: Diagnosis not present

## 2022-03-03 MED ORDER — ALPRAZOLAM 0.5 MG PO TABS: ORAL_TABLET | ORAL | 0 refills | Status: DC

## 2022-03-03 NOTE — Telephone Encounter (Signed)
done

## 2022-03-03 NOTE — Progress Notes (Signed)
Daily Session Note ? ?Patient Details  ?Name: Jonathon Snow ?MRN: [id number] ?Date of Birth: July 09, 1961 ?Referring Provider:   ?Flowsheet Row Pulmonary Rehab from 12/04/2021 in [facility] Cardiac and Pulmonary Rehab  ?Referring Provider [name] MD  ? ?  ? ? ?Encounter Date: 03/03/2022 ? ?Check In: ? Session Check In - 03/03/22 0821   ? ?  ? Check-In  ? Supervising physician immediately available to respond to emergencies See telemetry face sheet for immediately available ER MD   ? Location [facility]-Cardiac & Pulmonary Rehab   ? Staff Present Heath Lark, RN, BSN, CCRP;Joseph [place], RCP,RRT,BSRT;Kelly [place], [place], ACSM CEP, Exercise Physiologist   ? Virtual Visit No   ? Medication changes reported     No   ? Fall or balance concerns reported    No   ? Warm-up and Cool-down Performed on first and last piece of equipment   ? Resistance Training Performed Yes   ? VAD Patient? No   ? PAD/SET Patient? No   ?  ? Pain Assessment  ? Currently in Pain? No/denies   ? ?  ?  ? ?  ? ? ? ? ? ?Social History  ? ?Tobacco Use  ?Smoking Status Never  ?Smokeless Tobacco Never  ? ? ?Goals Met:  ?Proper associated with RPD/PD & O2 Sat ?Independence with exercise equipment ?Exercise tolerated well ?No report of concerns or symptoms today ? ?Goals Unmet:  ?Not Applicable ? ?Comments: Pt able to follow exercise prescription today without complaint.  Will continue to monitor for progression. ? ? ? ?Dr. Emily Filbert is Medical Director for [place].  ?Dr. [name] is Medical Director for [facility] Pulmonary Rehabilitation. ?

## 2022-03-03 NOTE — Telephone Encounter (Signed)
Medication Refill - Medication:  ?montelukast (SINGULAIR) 10 MG tablet 90 tablet 3 11/07/2020    ?Sig - Route: Take 1 tablet (10 mg total) by mouth at bedtime. - Oral   ?Sent to pharmacy as: montelukast (SINGULAIR) 10 MG tablet   ?Notes to Pharmacy: Need OV for any more refills   ?E-Prescribing Status: Receipt confirmed by pharmacy (11/07/2020  9:58 AM    ? ? ?Has the patient contacted their pharmacy? Yes.   ?(Agent: If no, request that the patient contact the pharmacy for the refill. If patient does not wish to contact the pharmacy document the reason why and proceed with request.) ?(Agent: If yes, when and what did the pharmacy advise?) Pharnacy calling for pt ? ?Preferred Pharmacy (with phone number or street name):  ?[facility] [place] [facility] [place]  ?[place] ([place] [address]  ?Phone: [phone] Fax: [phone]  ?Hours: Not open 24 hours  ? ?Has the patient been seen for an appointment in the last year OR does the patient have an upcoming appointment? No. ? ?Agent: Please be advised that RX refills may take up to 3 business days. We ask that you follow-up with your pharmacy. ? ?

## 2022-03-03 NOTE — Telephone Encounter (Signed)
Patient needs to reschedule appointment. ?

## 2022-03-04 ENCOUNTER — Other Ambulatory Visit: Payer: Self-pay

## 2022-03-04 ENCOUNTER — Ambulatory Visit: Payer: 59 | Admitting: Family Medicine

## 2022-03-04 ENCOUNTER — Inpatient Hospital Stay: Payer: 59

## 2022-03-04 ENCOUNTER — Telehealth: Payer: Self-pay

## 2022-03-04 ENCOUNTER — Encounter: Payer: Self-pay | Admitting: Family Medicine

## 2022-03-04 ENCOUNTER — Inpatient Hospital Stay: Payer: 59 | Admitting: Internal Medicine

## 2022-03-04 VITALS — BP 132/82 | HR 91 | Temp 97.4°F | Resp 16 | Ht 74.0 in | Wt 243.0 lb

## 2022-03-04 DIAGNOSIS — I10 Essential (primary) hypertension: Secondary | ICD-10-CM

## 2022-03-04 DIAGNOSIS — J302 Other seasonal allergic rhinitis: Secondary | ICD-10-CM

## 2022-03-04 DIAGNOSIS — E785 Hyperlipidemia, unspecified: Secondary | ICD-10-CM | POA: Diagnosis not present

## 2022-03-04 DIAGNOSIS — F321 Major depressive disorder, single episode, moderate: Secondary | ICD-10-CM | POA: Diagnosis not present

## 2022-03-04 DIAGNOSIS — Z79899 Other long term (current) drug therapy: Secondary | ICD-10-CM

## 2022-03-04 DIAGNOSIS — R7303 Prediabetes: Secondary | ICD-10-CM | POA: Diagnosis not present

## 2022-03-04 DIAGNOSIS — M3214 Glomerular disease in systemic lupus erythematosus: Secondary | ICD-10-CM

## 2022-03-04 DIAGNOSIS — D84821 Immunodeficiency due to drugs: Secondary | ICD-10-CM

## 2022-03-04 DIAGNOSIS — J45909 Unspecified asthma, uncomplicated: Secondary | ICD-10-CM

## 2022-03-04 DIAGNOSIS — R21 Rash and other nonspecific skin eruption: Secondary | ICD-10-CM

## 2022-03-04 DIAGNOSIS — Z5189 Encounter for other specified aftercare: Secondary | ICD-10-CM | POA: Diagnosis not present

## 2022-03-04 MED ORDER — SERTRALINE HCL 25 MG PO TABS: 25.0000 mg | ORAL_TABLET | Freq: Every day | ORAL | 1 refills | Status: DC

## 2022-03-04 MED ORDER — LOSARTAN POTASSIUM 100 MG PO TABS: 100.0000 mg | ORAL_TABLET | Freq: Every day | ORAL | 1 refills | Status: DC

## 2022-03-04 MED ORDER — MONTELUKAST SODIUM 10 MG PO TABS: 10.0000 mg | ORAL_TABLET | Freq: Every day | ORAL | 3 refills | Status: DC

## 2022-03-04 MED ORDER — ROSUVASTATIN CALCIUM 20 MG PO TABS: ORAL_TABLET | ORAL | 1 refills | Status: DC

## 2022-03-04 MED ORDER — AMLODIPINE BESYLATE 5 MG PO TABS: 5.0000 mg | ORAL_TABLET | Freq: Every day | ORAL | 3 refills | Status: DC

## 2022-03-04 NOTE — Progress Notes (Signed)
Daily Session Note ? ?Patient Details  ?Name: Jonathon Snow ?MRN: [id number] ?Date of Birth: 10/12/1961 ?Referring Provider:   ?Flowsheet Row Pulmonary Rehab from 12/04/2021 in [facility] Cardiac and Pulmonary Rehab  ?Referring Provider [name] MD  ? ?  ? ? ?Encounter Date: 03/04/2022 ? ?Check In: ? Session Check In - 03/04/22 0738   ? ?  ? Check-In  ? Supervising physician immediately available to respond to emergencies See telemetry face sheet for immediately available ER MD   ? Location [facility]-Cardiac & Pulmonary Rehab   ? Staff Present Birdie Sons, MPA, RN;Amanda Sommer, BA, ACSM CEP, Exercise Physiologist;Jessica Luan Pulling, MA, RCEP, CCRP, CCET   ? Virtual Visit No   ? Medication changes reported     No   ? Fall or balance concerns reported    No   ? Warm-up and Cool-down Performed on first and last piece of equipment   ? Resistance Training Performed Yes   ? VAD Patient? No   ? PAD/SET Patient? No   ?  ? Pain Assessment  ? Currently in Pain? No/denies   ? ?  ?  ? ?  ? ? ? ? ? ?Social History  ? ?Tobacco Use  ?Smoking Status Never  ?Smokeless Tobacco Never  ? ? ?Goals Met:  ?Independence with exercise equipment ?Exercise tolerated well ?No report of concerns or symptoms today ?Strength training completed today ? ?Goals Unmet:  ?Not Applicable ? ?Comments: Pt able to follow exercise prescription today without complaint.  Will continue to monitor for progression. ? ? ? ?Dr. Emily Filbert is Medical Director for [place].  ?Dr. [name] is Medical Director for [facility] Pulmonary Rehabilitation. ?

## 2022-03-04 NOTE — Progress Notes (Signed)
Patient ID: Jonathon Snow, male    DOB: [birth year], 61 y.o.   MRN: [id number]  PCP: Danelle Berry, PA-C  Chief Complaint  Patient presents with   Follow-up   Hypertension   COPD   Hyperlipidemia   Rash    Rash feels sore, denies itching/pain. At the beginning they will come and go now they remain all over face/head area.    Subjective:   Jonathon Snow is a 61 y.o. male, presents to clinic with CC of the following:  HPI  Here for rash and all routine f/up - though last routine OV was about a year ago  Rash to face and scalp, red, sore, scattered to cheeks forehead scalp and worse in the morning sometimes goes away throughout the day just seems to be gradually worsening, esp in AM Onset about 3 weeks ago No change in soaps/detergents, no swollen eyes, lip swelling, sensation of throat closure  Hypertension:  Currently managed on norvasc 5 mg and losartan 100 mg - kidney disease - sees nephrology often, labs reviewed through care everywhere Pt reports  med compliance and denies any SE.   Blood pressure today is well controlled. BP Readings from Last 3 Encounters:  03/04/22 132/82  01/03/22 117/79  01/01/22 (!) 165/95  Pt denies CP, palpitation, Ha's, visual disturbances, lightheadedness, hypotension, syncope  HLD - last labs were sig improved with crestor 20 mg every other night, hx of poor compliance - states he has been taking still every other day, some mild joint SE possibly, otherwise doing well but out of meds Lab Results  Component Value Date   CHOL 139 09/18/2021   HDL 45 09/18/2021   LDLCALC 76 09/18/2021   TRIG 92 09/18/2021   CHOLHDL 3.1 09/18/2021   SOB - seeing pulm, notes reviewed - on multiple meds and inhalers, SOB is better than last year  CKD, SLE glomerulonephritis - managed by nephro and rheum - he needs rheumatology f/up, most recent nephro appt Dr. Cherylann Ratel was happy with levels/current management and he would like pt to remain on current cellcept  and steroid dosing  Coagulopathy, recurrent PE/DVT longterm anticoagulant use/eliquis - trying again to change hematologist back to [facility]  Tremor - still present, some days severe, today mild, he cancelled a neuro consult for same, but has appt coming up  Depression/anxiety sx, today he feels pretty good He was given zoloft about 5 months ago - said he tried it but has been out - I can only see 30 d supply sent in but pt states he maybe has been out for a month - doesn't know if it helped that much, but he's willing to try again Depression screen [facility] 2/9 03/04/2022 02/25/2022 02/03/2022 12/30/2021 12/04/2021 10/07/2021 10/02/2021  Decreased Interest 1 1 1  0 1 0 1  Down, Depressed, Hopeless 0 1 1 0 1 0 1  PHQ - 2 Score 1 2 2  0 2 0 2  Altered sleeping 1 2 2 1 2  0 2  Tired, decreased energy 1 3 3 3 3  0 3  Change in appetite 0 1 3 0 3 0 2  Feeling bad or failure about yourself  0 0 0 0 0 0 2  Trouble concentrating 1 2 1 1 1  0 2  Moving slowly or fidgety/restless 0 1 1 0 1 0 1  Suicidal thoughts 0 0 0 0 0 0 0  PHQ-9 Score 4 11 12 5 12  0 14  Difficult doing work/chores Somewhat difficult -  Very difficult Somewhat difficult Somewhat difficult Not difficult at all Somewhat difficult  Some recent data might be hidden   Phq scores reviewed today, improved compared to a few months ago - still sig health changes for pt since 1-2 years ago    Patient Active Problem List   Diagnosis Date Noted   Varicose 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 01/03/2022   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) 09/18/2021   Prediabetes 11/08/2020   Stage 3a chronic kidney disease (HCC) 11/08/2020   Seasonal allergies 11/08/2020   Rhinosinusitis 11/08/2020   Long term current use of systemic steroids 11/08/2020   Anticoagulant disorder (HCC) 11/07/2020   Senile purpura (HCC) 11/07/2020   History of DVT (deep vein thrombosis) 03/22/2020   MDD (major depressive disorder), recurrent episode, mild  (HCC) 09/12/2019   Other spondylosis with radiculopathy, lumbar region 02/16/2019   Osteoporosis 10/12/2018   Chronic venous insufficiency 10/07/2018   SLE glomerulonephritis syndrome, 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HCC) 03/03/2018   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) 10/29/2016   Mixed hyperlipidemia 01/15/2016   Benign essential hypertension 01/15/2016   Obesity (BMI 30.0-34.9) 01/15/2016   Long term current use of anticoagulant 10/04/2015   Systemic lupus erythematosus (HCC) 05/30/2015   COPD, moderate (HCC) 06/27/2014   History of pulmonary embolism 12/11/2013   Nodule of right lung 12/11/2013   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   Cystic disease of liver 08/21/2013      Current Outpatient Medications:    albuterol (VENTOLIN HFA) 108 (90 Base) MCG/ACT inhaler, Inhale 2 puffs into the lungs every 6 (six) hours as needed for wheezing or shortness of breath., Disp: 18 g, Rfl: 2   ALPRAZolam (XANAX) 0.5 MG tablet, Take one tab one hour prior to procedure and one tab when you arrive in office, Disp: 2 tablet, Rfl: 0   amLODipine (NORVASC) 5 MG tablet, Take 1 tablet (5 mg total) by mouth daily., Disp: 90 tablet, Rfl: 3   apixaban (ELIQUIS) 5 MG TABS tablet, Take 1 tablet (5 mg total) by mouth 2 (two) times daily., Disp: 60 tablet, Rfl: 2   cholecalciferol (VITAMIN D3) 25 MCG (1000 UNIT) tablet, Take 1,000 Units by mouth daily., Disp: , Rfl:    COVID-19 mRNA bivalent vaccine, Pfizer, (PFIZER COVID-19 VAC BIVALENT) injection, Inject into the muscle., Disp: 0.3 mL, Rfl: 0   ipratropium-albuterol (DUONEB) 0.5-2.5 (3) MG/3ML SOLN, Take 3 mLs by nebulization every 4 (four) hours as needed., Disp: 360 mL, Rfl: 3   losartan (COZAAR) 100 MG tablet, Take 1 tablet (100 mg total) by mouth daily., Disp: 90 tablet, Rfl: 0   montelukast (SINGULAIR) 10 MG tablet, Take 1 tablet (10 mg total) by mouth at bedtime., Disp: 90 tablet, Rfl: 3   Multiple  Vitamins-Minerals (MULTIVITAMIN WITH MINERALS) tablet, Take 1 tablet by mouth daily., Disp: , Rfl:    mycophenolate (CELLCEPT) 500 MG tablet, Take 1,000 mg by mouth 2 (two) times daily., Disp: , Rfl:    predniSONE (DELTASONE) 10 MG tablet, Take 10 mg by mouth daily with breakfast., Disp: , Rfl:    rosuvastatin (CRESTOR) 20 MG tablet, Take one pill every other night before bed., Disp: 45 tablet, Rfl: 0   sertraline (ZOLOFT) 25 MG tablet, Take 1 tablet (25 mg total) by mouth daily., Disp: 30 tablet, Rfl: 0   sildenafil (REVATIO) 20 MG tablet, 3-5 tablets as needed 30 min prior to intercourse, Disp: 30 tablet, Rfl: 11  doxycycline (VIBRA-TABS) 100 MG tablet, Take 1 tablet (100 mg total) by mouth 2 (two) times daily. (Patient not taking: Reported on 03/04/2022), Disp: 14 tablet, Rfl: 0   ferrous sulfate 325 (65 FE) MG tablet, Take 325 mg by mouth daily with breakfast. (Patient not taking: Reported on 03/04/2022), Disp: , Rfl:    mupirocin ointment (BACTROBAN) 2 %, Apply 1 application topically daily. Until healed at bottom of right foot (Patient not taking: Reported on 03/04/2022), Disp: 22 g, Rfl: 0   nystatin-triamcinolone ointment (MYCOLOG), Apply 1 application topically 2 (two) times daily. (Patient not taking: Reported on 03/04/2022), Disp: 30 g, Rfl: 0   oxybutynin (DITROPAN XL) 15 MG 24 hr tablet, Take 1 tablet (15 mg total) by mouth daily. (Patient not taking: Reported on 03/04/2022), Disp: 30 tablet, Rfl: 11   tamsulosin (FLOMAX) 0.4 MG CAPS capsule, Take 1 capsule (0.4 mg total) by mouth daily. (Patient not taking: Reported on 03/04/2022), Disp: 30 capsule, Rfl: 11   traMADol (ULTRAM) 50 MG tablet, Take 1 tablet (50 mg total) by mouth every 6 (six) hours as needed. (Patient not taking: Reported on 03/04/2022), Disp: 20 tablet, Rfl: 0   Allergies  Allergen Reactions   Enalapril Other (See Comments)    Unknown reaction   Vicodin [Hydrocodone-Acetaminophen] Hives and Rash    Severe headaches (also)  NAME BRAND ONLY PER PT CAN TAKE GENERIC     Social History   Tobacco Use   Smoking status: Never   Smokeless tobacco: Never  Vaping Use   Vaping Use: Never used  Substance Use Topics   Alcohol use: No   Drug use: No    Comment: PT DENIES      Chart Review Today: I personally reviewed active problem list, medication list, allergies, family history, social history, health maintenance, notes from last encounter, lab results, imaging with the patient/caregiver today. Extensive chart review of pertinent labs and OV notes - more than 10 min today   Review of Systems  Constitutional: Negative.   HENT: Negative.    Eyes: Negative.   Respiratory: Negative.    Cardiovascular: Negative.   Gastrointestinal: Negative.   Endocrine: Negative.   Genitourinary: Negative.   Musculoskeletal: Negative.   Skin:  Positive for rash. Negative for wound.  Allergic/Immunologic: Negative.   Neurological:  Positive for tremors. Negative for dizziness and light-headedness.  Hematological: Negative.   Psychiatric/Behavioral: Negative.    All other systems reviewed and are negative.     Objective:   Vitals:   03/04/22 1025  BP: 132/82  Pulse: 91  Resp: 16  Temp: (!) 97.4 F (36.3 C)  TempSrc: Oral  SpO2: 98%  Weight: 243 lb (110.2 kg)  Height: 6\' 2"  (1.88 m)    Body mass index is 31.2 kg/m.  Physical Exam Vitals and nursing note reviewed.  Constitutional:      General: He is not in acute distress.    Appearance: He is well-developed and well-groomed. He is obese. He is not toxic-appearing or diaphoretic.     Comments: Chronically ill appearing  HENT:     Head: Normocephalic and atraumatic.     Right Ear: External ear normal.     Left Ear: External ear normal.     Nose: Nose normal.  Eyes:     General:        Right eye: No discharge.        Left eye: No discharge.     Conjunctiva/sclera: Conjunctivae normal.  Neck:  Trachea: No tracheal deviation.  Cardiovascular:      Rate and Rhythm: Normal rate and regular rhythm.  Pulmonary:     Effort: Pulmonary effort is normal. No tachypnea, accessory muscle usage, respiratory distress or retractions.     Breath sounds: No stridor. No decreased breath sounds, wheezing or rhonchi.  Abdominal:     General: Bowel sounds are normal.     Palpations: Abdomen is soft.  Skin:    General: Skin is warm and dry.     Findings: Rash (faint erythematous maculopapular rash to cheeks and even more faint to forehead, none noted to scalp) present.  Neurological:     Mental Status: He is alert.     Motor: Tremor (mild today - to bilateral hands) present. No abnormal muscle tone.     Gait: Gait abnormal.     Comments: Difficulty with going from sitting to standing Ambulated down hall without assistance, slightly antalgic gait  Psychiatric:        Behavior: Behavior normal. Behavior is cooperative.     Results for orders placed or performed during the hospital encounter of 11/01/21  Aspergillus Ag, BAL/Serum   Specimen: Bronchial Alveolar Lavage; Respiratory  Result Value Ref Range   Aspergillus Ag, BAL/Serum 0.08 0.00 - 0.49 Index  Culture, BAL-quantitative w Gram Stain   Specimen: Bronchoalveolar Lavage; Respiratory  Result Value Ref Range   Specimen Description      BRONCHIAL ALVEOLAR LAVAGE Performed at [facility], [address] [place], [name] [address]    Special Requests      NONE Performed at [facility], [address] [place], [name] [address]    Gram Stain NO WBC SEEN NO ORGANISMS SEEN     Culture      NO GROWTH 2 DAYS Performed at [facility], [address] [place], [name] [address]    Report Status 11/05/2021 FINAL   Acid Fast Smear (AFB)   Specimen: Bronchoalveolar Lavage; Respiratory  Result Value Ref Range   AFB Specimen Processing Concentration    Acid Fast Smear Negative    Source (AFB) BRONCHIAL ALVEOLAR LAVAGE   Acid Fast Culture with reflexed  sensitivities   Specimen: Bronchoalveolar Lavage; Respiratory  Result Value Ref Range   Acid Fast Culture Negative    Source of Sample BRONCHIAL ALVEOLAR LAVAGE   Culture, fungus without smear   Specimen: Bronchoalveolar Lavage; Other  Result Value Ref Range   Specimen Description      BRONCHIAL ALVEOLAR LAVAGE Performed at [facility], [address] [place], [name] [address]    Special Requests      NONE Performed at [facility], [address] [place], [name] [address]    Culture      NO FUNGUS ISOLATED AFTER 21 DAYS Performed at [facility], [address] [place], [name] [address]    Report Status 11/22/2021 FINAL   Bordetella pertussis PCR   Specimen: Bronchoalveolar Lavage; Nasopharyngeal  Result Value Ref Range   B pertussis, DNA Negative Negative   B parapertussis, DNA Negative Negative  Virus culture   Specimen: Bronchoalveolar Lavage  Result Value Ref Range   Viral Culture No virus isolated.    Source of Sample BRONCHIAL ALVEOLAR LAVAGE   Body fluid cell count with differential  Result Value Ref Range   Fluid Type-FCT BRONCHIAL ALVEOLAR LAVAGE    Color, Fluid COLORLESS    Appearance, Fluid CLEAR CLEAR   Total Nucleated Cell Count, Fluid 4  0 - 1,000 cu mm   Other Cells, Fluid TOO FEW TO COUNT, SMEAR AVAILABLE FOR REVIEW %       Assessment & Plan:     ICD-10-CM   1. Hypertension, unspecified type  I10 losartan (COZAAR) 100 MG tablet    amLODipine (NORVASC) 5 MG tablet   Blood pressure stable and well-controlled today on losartan 100 mg and amlodipine 5 mg, reviewed labs per care everywhere     2. Hyperlipidemia, unspecified hyperlipidemia type  E78.5 rosuvastatin (CRESTOR) 20 MG tablet   last lipids reviewed, at goal and well controlled, tolerating crestor every other day, due for recheck in sept    3. Prediabetes  R73.03    will need recheck of A1C, but out of POC care today - do A1C next visit    4. Current moderate  episode of major depressive disorder, unspecified whether recurrent (HCC)  F32.1 sertraline (ZOLOFT) 25 MG tablet   PHQ score 4 today, was on zoloft, not sure how long or compliance, pt unsure if it was effective, rx given for 6 months, advised f/up appt if worsening or SE    5. Seasonal allergies  J30.2 montelukast (SINGULAIR) 10 MG tablet   doing better than in the past, needs refill on singulair    6. Rash and nonspecific skin eruption  R21 Ambulatory referral to Dermatology   unclear etiology, faint maculopapular rash to cheeks visible, worse in AM, no new soaps detergents - discussed resuming singular, moisturize  chronic prednisone - could possibly be related to rash to face/scalp? can try antifungal shampoo/wash and see if it helps  Pattern of increased severity upon waking and improving throughout the day - could be related to steroids/SLE?  Discussed tx with ketoconazole soaps OTC twice a week to face and scalp - not likely to have SE and may help if some fungal component with hx?  It may improve with resuming singulair and with hydration?   If not improved pt will f/up with dermatology     7. Immunocompromised state due to drug therapy [facility]) Chronic D84.821    Z79.899    chronic prednisone - could possibly be related to rash?  See above     8. SLE glomerulonephritis syndrome, 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HCC) Chronic M32.14    per nephro - labs and recent OV reviewed     Spent more than 10 min today on chart review, personal review of labs pertinent to care and management today More than 20 minutes with pt in exam room Additional 15 min for documentation, orders, chart completion  6 month routine f/up  Danelle Berry, PA-C 03/04/22 10:34 AM

## 2022-03-04 NOTE — Telephone Encounter (Signed)
Requested medication (s) are due for refill today - expired Rx ? ?Requested medication (s) are on the active medication list -yes ? ?Future visit scheduled -no ? ?Last refill: 11/07/20 #90 3RF ? ?Notes to clinic: Request RF: expired Rx ? ?Requested Prescriptions  ?Pending Prescriptions Disp Refills  ? montelukast (SINGULAIR) 10 MG tablet 90 tablet 3  ?  Sig: Take 1 tablet (10 mg total) by mouth at bedtime.  ?  ? Pulmonology:  Leukotriene Inhibitors Passed - 03/03/2022  3:55 PM  ?  ?  Passed - Valid encounter within last 12 months  ?  Recent Outpatient Visits   ? ?      ? 4 months ago Diarrhea, unspecified type  ? [place], DO  ? 5 months ago Current moderate episode of major depressive disorder, unspecified whether recurrent ([place])  ? [facility] Bo Merino, FNP  ? 5 months ago SOB (shortness of breath)  ? [facility] Delsa Grana, [place]  ? 6 months ago SOB (shortness of breath)  ? [facility] Steele Sizer, MD  ? 6 months ago Need for Tdap vaccination  ? [facility] Kathrine Haddock, NP  ? ?  ?  ?Future Appointments   ? ?        ? In 2 weeks Pieter Partridge, DO [place] [facility]  ? In 1 month Hollice Espy, MD [place]  ? ?  ? ?  ?  ?  ? ? ? ?Requested Prescriptions  ?Pending Prescriptions Disp Refills  ? montelukast (SINGULAIR) 10 MG tablet 90 tablet 3  ?  Sig: Take 1 tablet (10 mg total) by mouth at bedtime.  ?  ? Pulmonology:  Leukotriene Inhibitors Passed - 03/03/2022  3:55 PM  ?  ?  Passed - Valid encounter within last 12 months  ?  Recent Outpatient Visits   ? ?      ? 4 months ago Diarrhea, unspecified type  ? [place], DO  ? 5 months ago Current moderate episode of major depressive disorder, unspecified whether recurrent ([place])  ? [facility] Bo Merino, FNP  ? 5 months ago SOB (shortness  of breath)  ? [facility] Delsa Grana, [place]  ? 6 months ago SOB (shortness of breath)  ? [facility] Steele Sizer, MD  ? 6 months ago Need for Tdap vaccination  ? [facility] Kathrine Haddock, NP  ? ?  ?  ?Future Appointments   ? ?        ? In 2 weeks Pieter Partridge, DO [place] [facility]  ? In 1 month Hollice Espy, MD [place]  ? ?  ? ?  ?  ?  ? ? ? ?

## 2022-03-04 NOTE — Telephone Encounter (Signed)
Returned [name] call and no answer, unable to leave vm. Pt stated last time I spoke to him he prefers to be seen here at [facility] for his Oncology appointments. I have tried to reach out to [place] with [facility] to know the reasons why he needs a referral with them but can never get in touch with her. ?

## 2022-03-04 NOTE — Telephone Encounter (Signed)
Copied from [place] [phone]. Topic: Referral - Status >> Mar 04, 2022  1:39 PM Yvette Rack wrote: Reason for CRM: [name] with [facility] requests that a referral be sent to [facility]. Cb# [phone]

## 2022-03-06 ENCOUNTER — Telehealth: Payer: Self-pay | Admitting: Family Medicine

## 2022-03-06 NOTE — Telephone Encounter (Signed)
Referral Request - Has patient seen PCP for this complaint? Yes.   ?*If NO, is insurance requiring patient see PCP for this issue before PCP can refer them? ?Referral for which specialty: hematology ? [facility] ?Reason for referral: the current referral for pt has expired. ?Cb [phone] ?Fax [phone] ? ?[name] calling from [place]. ?

## 2022-03-07 ENCOUNTER — Other Ambulatory Visit (INDEPENDENT_AMBULATORY_CARE_PROVIDER_SITE_OTHER): Payer: 59 | Admitting: Vascular Surgery

## 2022-03-07 ENCOUNTER — Other Ambulatory Visit: Payer: Self-pay

## 2022-03-07 DIAGNOSIS — Z7901 Long term (current) use of anticoagulants: Secondary | ICD-10-CM

## 2022-03-07 DIAGNOSIS — Z86711 Personal history of pulmonary embolism: Secondary | ICD-10-CM

## 2022-03-07 DIAGNOSIS — Z86718 Personal history of other venous thrombosis and embolism: Secondary | ICD-10-CM

## 2022-03-07 NOTE — Telephone Encounter (Signed)
Referral has been placed. 

## 2022-03-08 ENCOUNTER — Other Ambulatory Visit: Payer: Self-pay | Admitting: Family Medicine

## 2022-03-08 DIAGNOSIS — I10 Essential (primary) hypertension: Secondary | ICD-10-CM

## 2022-03-10 ENCOUNTER — Telehealth: Payer: Self-pay

## 2022-03-10 NOTE — Telephone Encounter (Signed)
Rx sent in a week ago ?

## 2022-03-10 NOTE — Telephone Encounter (Signed)
Copied from [place] [phone]. Topic: Referral - Request for Referral ?>> Mar 10, 2022  3:39 PM Alanda Slim E wrote: ?Has patient seen PCP for this complaint? Yes  ?*If NO, is insurance requiring patient see PCP for this issue before PCP can refer them? ?Referral for which specialty: Hematology  ?Preferred provider/office: [facility]  ?Reason for referral: pt does not have a recent referral ?

## 2022-03-10 NOTE — Telephone Encounter (Signed)
Referral has already been placed.

## 2022-03-12 ENCOUNTER — Encounter: Payer: Self-pay | Admitting: *Deleted

## 2022-03-12 DIAGNOSIS — J45909 Unspecified asthma, uncomplicated: Secondary | ICD-10-CM

## 2022-03-12 NOTE — Progress Notes (Signed)
Pulmonary Individual Treatment Plan ? ?Patient Details  ?Name: Jonathon Snow ?MRN: [id number] ?Date of Birth: September 10, 1961 ?Referring Provider:   ?Flowsheet Row Pulmonary Rehab from 12/04/2021 in [facility] Cardiac and Pulmonary Rehab  ?Referring Provider [name] MD  ? ?  ? ? ?Initial Encounter Date:  ?Flowsheet Row Pulmonary Rehab from 12/04/2021 in [facility] Cardiac and Pulmonary Rehab  ?Date 12/04/21  ? ?  ? ? ?Visit Diagnosis: Asthma, unspecified asthma severity, unspecified whether complicated, unspecified whether persistent ? ?Patient's Home Medications on Admission: ? ?Current Outpatient Medications:  ?  albuterol (VENTOLIN HFA) 108 (90 Base) MCG/ACT inhaler, Inhale 2 puffs into the lungs every 6 (six) hours as needed for wheezing or shortness of breath., Disp: 18 g, Rfl: 2 ?  ALPRAZolam (XANAX) 0.5 MG tablet, Take one tab one hour prior to procedure and one tab when you arrive in office, Disp: 2 tablet, Rfl: 0 ?  amLODipine (NORVASC) 5 MG tablet, Take 1 tablet (5 mg total) by mouth daily., Disp: 90 tablet, Rfl: 3 ?  apixaban (ELIQUIS) 5 MG TABS tablet, Take 1 tablet (5 mg total) by mouth 2 (two) times daily., Disp: 60 tablet, Rfl: 2 ?  cholecalciferol (VITAMIN D3) 25 MCG (1000 UNIT) tablet, Take 1,000 Units by mouth daily., Disp: , Rfl:  ?  ipratropium-albuterol (DUONEB) 0.5-2.5 (3) MG/3ML SOLN, Take 3 mLs by nebulization every 4 (four) hours as needed., Disp: 360 mL, Rfl: 3 ?  losartan (COZAAR) 100 MG tablet, Take 1 tablet (100 mg total) by mouth daily., Disp: 90 tablet, Rfl: 1 ?  montelukast (SINGULAIR) 10 MG tablet, Take 1 tablet (10 mg total) by mouth at bedtime., Disp: 90 tablet, Rfl: 3 ?  Multiple Vitamins-Minerals (MULTIVITAMIN WITH MINERALS) tablet, Take 1 tablet by mouth daily., Disp: , Rfl:  ?  mycophenolate (CELLCEPT) 500 MG tablet, Take 1,000 mg by mouth 2 (two) times daily., Disp: , Rfl:  ?  oxybutynin (DITROPAN XL) 15 MG 24 hr tablet, Take 1 tablet (15 mg total) by mouth daily. (Patient not taking:  Reported on 03/04/2022), Disp: 30 tablet, Rfl: 11 ?  predniSONE (DELTASONE) 10 MG tablet, Take 10 mg by mouth daily with breakfast., Disp: , Rfl:  ?  rosuvastatin (CRESTOR) 20 MG tablet, Take one pill every other night before bed., Disp: 45 tablet, Rfl: 1 ?  sertraline (ZOLOFT) 25 MG tablet, Take 1 tablet (25 mg total) by mouth daily., Disp: 90 tablet, Rfl: 1 ?  sildenafil (REVATIO) 20 MG tablet, 3-5 tablets as needed 30 min prior to intercourse, Disp: 30 tablet, Rfl: 11 ?  tamsulosin (FLOMAX) 0.4 MG CAPS capsule, Take 1 capsule (0.4 mg total) by mouth daily. (Patient not taking: Reported on 03/04/2022), Disp: 30 capsule, Rfl: 11 ? ?Past Medical History: ?Past Medical History:  ?Diagnosis Date  ? Calculus of kidney 08/21/2013  ? 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 Overview:  superior sagittal sinus, left transverse sinus and cortical veins   ? COVID-19 virus infection 12/2020  ? Depression   ? DVT (deep venous thrombosis) ([place])   ? Dyspnea   ? GERD (gastroesophageal reflux disease)   ? Heel spur, left 02/16/2019  ? Heel spur, right 02/16/2019  ? Herpes zoster infection of lumbar region 02/20/2020  ? Hyperlipidemia   ? Hypertension   ? Lupus ([place])   ? Lymphedema 10/07/2018  ? Morbid obesity ([place])   ? Opiate abuse, episodic ([place]) 02/26/2018  ? Osteoporosis   ? Pneumonia   ? PONV (postoperative nausea and vomiting)   ?  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 Presence of IVC filter 03/22/2020  ? Removed  ? Pulmonary embolism ([place])   ? Renal disorder   ? Stage III  ? ? ?Tobacco Use: ?Social History  ? ?Tobacco Use  ?Smoking Status Never  ?Smokeless Tobacco Never  ? ? ?Labs: ?Review Flowsheet   ? ?  ?  Latest Ref Rng & Units 02/15/2019 01/17/2020 11/07/2020 03/19/2021  ?Labs for ITP Cardiac and Pulmonary Rehab  ?Cholestrol <200 mg/dL 244   227   275     ?LDL (calc) mg/dL (calc) 169   159   196     ?HDL-C > OR = 40 mg/dL 50   46   55     ?Trlycerides <150 mg/dL 119   108   107     ?Hemoglobin A1c  <5.7 % of total Hgb   5.8   5.8    ? ?  09/18/2021  ?Labs for ITP Cardiac and Pulmonary Rehab  ?Cholestrol 139    ?LDL (calc) 76    ?HDL-C 45    ?Trlycerides 92    ?Hemoglobin A1c 5.7    ?  ? ? Multiple values from one day are sorted in reverse-chronological order  ?  ?  ? ? ? ?Pulmonary Assessment Scores: ? Pulmonary Assessment Scores   ? ? [place] Name 12/04/21 1237  ?  ?  ?  ? ADL UCSD  ? ADL Phase Entry    ? SOB Score total 79    ? Rest 1    ? Walk 5    ? Stairs 5    ? Bath 2    ? Dress 2    ? Shop 3    ?  ? CAT Score  ? CAT Score 20    ?  ? mMRC Score  ? mMRC Score 3    ? ?  ?  ? ?  ?  ?UCSD: ?Self-administered rating of dyspnea associated with activities of daily living (ADLs) ?6-point scale (0 = "not at all" to 5 = "maximal or unable to do because of breathlessness")  ?Scoring Scores range from 0 to 120.  Minimally important difference is 5 units ? ?CAT: ?CAT can identify the health impairment of COPD patients and is better correlated with disease progression.  ?CAT has a scoring range of zero to 40. The CAT score is classified into four groups of low (less than 10), medium (10 - 20), high (21-30) and very high (31-40) based on the impact level of disease on health status. A CAT score over 10 suggests significant symptoms.  A worsening CAT score could be explained by an exacerbation, poor medication adherence, poor inhaler technique, or progression of COPD or comorbid conditions.  ?CAT MCID is 2 points ? ?mMRC: ?mMRC (Modified Medical Research Council) Dyspnea Scale is used to assess the degree of baseline functional disability in patients of respiratory disease due to dyspnea. ?No minimal important difference is established. A decrease in score of 1 point or greater is considered a positive change.  ? ?Pulmonary Function Assessment: ? Pulmonary Function Assessment - 10/23/21 1538   ? ?  ? Breath  ? Shortness of Breath Yes;Limiting activity;Panic with Shortness of Breath   ? ?  ?  ? ?  ? ? ?Exercise Target  Goals: ?Exercise Program Goal: ?Individual exercise prescription set using results from initial 6 min walk test and THRR while considering  patient?s activity barriers and safety.  ? ?Exercise Prescription  Goal: ?Initial exercise prescription builds to 30-45 minutes a day of aerobic activity, 2-3 days per week.  Home exercise guidelines will be given to patient during program as part of exercise prescription that the participant will acknowledge. ? ?Education: Aerobic Exercise: ?- Group verbal and visual presentation on the components of exercise prescription. Introduces F.I.T.T principle from ACSM for exercise prescriptions.  Reviews F.I.T.T. principles of aerobic exercise including progression. Written material given at graduation. ?Flowsheet Row Pulmonary Rehab from 02/27/2022 in [facility] Cardiac and Pulmonary Rehab  ?Date 12/19/21  ?Educator [facility]  ?Instruction Review Code 1- Verbalizes Understanding  ? ?  ? ? ?Education: Resistance Exercise: ?- Group verbal and visual presentation on the components of exercise prescription. Introduces F.I.T.T principle from ACSM for exercise prescriptions  Reviews F.I.T.T. principles of resistance exercise including progression. Written material given at graduation. ?Flowsheet Row Pulmonary Rehab from 02/27/2022 in [facility] Cardiac and Pulmonary Rehab  ?Date 12/26/21  ?Educator [facility]  ?Instruction Review Code 1- Verbalizes Understanding  ? ?  ? ?  ?Education: Exercise & Equipment Safety: ?- Individual verbal instruction and demonstration of equipment use and safety with use of the equipment. ?Flowsheet Row Pulmonary Rehab from 10/23/2021 in [facility] Cardiac and Pulmonary Rehab  ?Date 10/23/21  ?Educator [facility]  ?Instruction Review Code 1- Verbalizes Understanding  ? ?  ? ? ?Education: Exercise Physiology & General Exercise Guidelines: ?- Group verbal and written instruction with models to review the exercise physiology of the cardiovascular system and associated critical values. Provides general exercise  guidelines with specific guidelines to those with heart or lung disease.  ?Flowsheet Row Pulmonary Rehab from 12/04/2021 in [facility] Cardiac and Pulmonary Rehab  ?Education need identified 12/04/21  ? ?  ? ? ?Edu

## 2022-03-14 ENCOUNTER — Telehealth: Payer: Self-pay | Admitting: Family Medicine

## 2022-03-14 ENCOUNTER — Encounter (INDEPENDENT_AMBULATORY_CARE_PROVIDER_SITE_OTHER): Payer: 59

## 2022-03-14 NOTE — Telephone Encounter (Signed)
Referral Request - Has patient seen PCP for this complaint? Yes.   ?*If NO, is insurance requiring patient see PCP for this issue before PCP can refer them? ?Referral for which specialty: Hematology and oncology ?Preferred provider/office: [facility] ?Reason for referral: ongoing issues ?Judson Roch w/ [facility] calling to request new referral ?Cb [phone] ?Fax [phone] ?  ?

## 2022-03-14 NOTE — Telephone Encounter (Signed)
Referral has been placed faxed over a copy to [place]. ?

## 2022-03-17 ENCOUNTER — Other Ambulatory Visit: Payer: Self-pay

## 2022-03-17 ENCOUNTER — Encounter: Payer: 59 | Admitting: *Deleted

## 2022-03-17 DIAGNOSIS — Z5189 Encounter for other specified aftercare: Secondary | ICD-10-CM | POA: Diagnosis not present

## 2022-03-17 DIAGNOSIS — J45909 Unspecified asthma, uncomplicated: Secondary | ICD-10-CM

## 2022-03-17 NOTE — Progress Notes (Signed)
Daily Session Note ? ?Patient Details  ?Name: Jonathon Snow ?MRN: [id number] ?Date of Birth: May 06, 1961 ?Referring Provider:   ?Flowsheet Row Pulmonary Rehab from 12/04/2021 in [facility] Cardiac and Pulmonary Rehab  ?Referring Provider [name] MD  ? ?  ? ? ?Encounter Date: 03/17/2022 ? ?Check In: ? Session Check In - 03/17/22 0835   ? ?  ? Check-In  ? Supervising physician immediately available to respond to emergencies See telemetry face sheet for immediately available ER MD   ? Location [facility]-Cardiac & Pulmonary Rehab   ? Staff Present Heath Lark, RN, BSN, CCRP;Joseph [place], RCP,RRT,BSRT;Kelly [place], [place], ACSM CEP, Exercise Physiologist   ? Virtual Visit No   ? Medication changes reported     No   ? Fall or balance concerns reported    No   ? Warm-up and Cool-down Performed on first and last piece of equipment   ? Resistance Training Performed Yes   ? VAD Patient? No   ? PAD/SET Patient? No   ?  ? Pain Assessment  ? Currently in Pain? No/denies   ? ?  ?  ? ?  ? ? ? ? ? ?Social History  ? ?Tobacco Use  ?Smoking Status Never  ?Smokeless Tobacco Never  ? ? ?Goals Met:  ?Proper associated with RPD/PD & O2 Sat ?Independence with exercise equipment ?Exercise tolerated well ?No report of concerns or symptoms today ? ?Goals Unmet:  ?Not Applicable ? ?Comments: Pt able to follow exercise prescription today without complaint.  Will continue to monitor for progression. ? ? ? ?Dr. Emily Filbert is Medical Director for [place].  ?Dr. [name] is Medical Director for [facility] Pulmonary Rehabilitation. ?

## 2022-03-18 ENCOUNTER — Other Ambulatory Visit: Payer: Self-pay

## 2022-03-18 DIAGNOSIS — J45909 Unspecified asthma, uncomplicated: Secondary | ICD-10-CM

## 2022-03-18 DIAGNOSIS — Z5189 Encounter for other specified aftercare: Secondary | ICD-10-CM | POA: Diagnosis not present

## 2022-03-18 NOTE — Progress Notes (Signed)
Daily Session Note ? ?Patient Details  ?Name: Jonathon Snow ?MRN: [id number] ?Date of Birth: 12/09/1961 ?Referring Provider:   ?Flowsheet Row Pulmonary Rehab from 12/04/2021 in [facility] Cardiac and Pulmonary Rehab  ?Referring Provider [name] MD  ? ?  ? ? ?Encounter Date: 03/18/2022 ? ?Check In: ? Session Check In - 03/18/22 0743   ? ?  ? Check-In  ? Supervising physician immediately available to respond to emergencies See telemetry face sheet for immediately available ER MD   ? Location [facility]-Cardiac & Pulmonary Rehab   ? Staff Present Birdie Sons, MPA, RN;Amanda Sommer, BA, ACSM CEP, Exercise Physiologist;Jessica Luan [place], MA, RCEP, CCRP, CCET   ? Virtual Visit No   ? Medication changes reported     No   ? Fall or balance concerns reported    No   ? Warm-up and Cool-down Performed on first and last piece of equipment   ? Resistance Training Performed Yes   ? VAD Patient? No   ? PAD/SET Patient? No   ?  ? Pain Assessment  ? Currently in Pain? No/denies   ? ?  ?  ? ?  ? ? ? ? ? ?Social History  ? ?Tobacco Use  ?Smoking Status Never  ?Smokeless Tobacco Never  ? ? ?Goals Met:  ?Independence with exercise equipment ?Exercise tolerated well ?No report of concerns or symptoms today ?Strength training completed today ? ?Goals Unmet:  ?Not Applicable ? ?Comments: Pt able to follow exercise prescription today without complaint.  Will continue to monitor for progression. ? ? ? ?Dr. Emily Filbert is Medical Director for [place].  ?Dr. [name] is Medical Director for [facility] Pulmonary Rehabilitation. ?

## 2022-03-21 ENCOUNTER — Ambulatory Visit: Payer: 59 | Admitting: Neurology

## 2022-03-25 ENCOUNTER — Encounter: Payer: 59 | Attending: Pulmonary Disease

## 2022-03-25 DIAGNOSIS — J45909 Unspecified asthma, uncomplicated: Secondary | ICD-10-CM | POA: Insufficient documentation

## 2022-03-25 NOTE — Progress Notes (Signed)
Daily Session Note ? ?Patient Details  ?Name: Jonathon Snow ?MRN: [id number] ?Date of Birth: Feb 18, 1961 ?Referring Provider:   ?Flowsheet Row Pulmonary Rehab from 12/04/2021 in [facility] Cardiac and Pulmonary Rehab  ?Referring Provider [name] MD  ? ?  ? ? ?Encounter Date: 03/25/2022 ? ?Check In: ? Session Check In - 03/25/22 0802   ? ?  ? Check-In  ? Supervising physician immediately available to respond to emergencies See telemetry face sheet for immediately available ER MD   ? Location [facility]-Cardiac & Pulmonary Rehab   ? Staff Present Birdie Sons, MPA, RN;Jessica [place], MA, RCEP, CCRP, CCET;Amanda Sommer, BA, ACSM CEP, Exercise Physiologist   ? Virtual Visit No   ? Medication changes reported     No   ? Fall or balance concerns reported    No   ? Warm-up and Cool-down Performed on first and last piece of equipment   ? Resistance Training Performed Yes   ? VAD Patient? No   ? PAD/SET Patient? No   ?  ? Pain Assessment  ? Currently in Pain? No/denies   ? ?  ?  ? ?  ? ? ? ? ? ?Social History  ? ?Tobacco Use  ?Smoking Status Never  ?Smokeless Tobacco Never  ? ? ?Goals Met:  ?Independence with exercise equipment ?Exercise tolerated well ?No report of concerns or symptoms today ?Strength training completed today ? ?Goals Unmet:  ?Not Applicable ? ?Comments: Pt able to follow exercise prescription today without complaint.  Will continue to monitor for progression. ? ? ? ?Dr. Emily Filbert is Medical Director for [place].  ?Dr. [name] is Medical Director for [facility] Pulmonary Rehabilitation. ?

## 2022-03-27 DIAGNOSIS — J45909 Unspecified asthma, uncomplicated: Secondary | ICD-10-CM | POA: Diagnosis not present

## 2022-03-27 NOTE — Progress Notes (Signed)
Daily Session Note ? ?Patient Details  ?Name: Jonathon Snow ?MRN: [id number] ?Date of Birth: [birth year] ?Referring Provider:   ?Flowsheet Row Pulmonary Rehab from 12/04/2021 in [facility] Cardiac and Pulmonary Rehab  ?Referring Provider [name] MD  ? ?  ? ? ?Encounter Date: 03/27/2022 ? ?Check In: ? Session Check In - 03/27/22 0759   ? ?  ? Check-In  ? Supervising physician immediately available to respond to emergencies See telemetry face sheet for immediately available ER MD   ? Location [facility]-Cardiac & Pulmonary Rehab   ? Staff Present Birdie Sons, MPA, RN;Jessica [place], MA, RCEP, CCRP, [place], BS, ACSM CEP, Exercise Physiologist   ? Virtual Visit No   ? Medication changes reported     No   ? Fall or balance concerns reported    No   ? Warm-up and Cool-down Performed on first and last piece of equipment   ? Resistance Training Performed Yes   ? VAD Patient? No   ? PAD/SET Patient? No   ?  ? Pain Assessment  ? Currently in Pain? No/denies   ? ?  ?  ? ?  ? ? ? ? ? ?Social History  ? ?Tobacco Use  ?Smoking Status Never  ?Smokeless Tobacco Never  ? ? ?Goals Met:  ?Independence with exercise equipment ?Exercise tolerated well ?No report of concerns or symptoms today ?Strength training completed today ? ?Goals Unmet:  ?Not Applicable ? ?Comments: Pt able to follow exercise prescription today without complaint.  Will continue to monitor for progression. ? ? ? ?Dr. Emily Filbert is Medical Director for [place].  ?Dr. [name] is Medical Director for [facility] Pulmonary Rehabilitation. ?

## 2022-04-01 ENCOUNTER — Encounter: Payer: Self-pay | Admitting: Internal Medicine

## 2022-04-01 ENCOUNTER — Inpatient Hospital Stay: Payer: 59 | Attending: Internal Medicine | Admitting: Internal Medicine

## 2022-04-01 ENCOUNTER — Inpatient Hospital Stay: Payer: 59

## 2022-04-01 DIAGNOSIS — I129 Hypertensive chronic kidney disease with stage 1 through stage 4 chronic kidney disease, or unspecified chronic kidney disease: Secondary | ICD-10-CM | POA: Insufficient documentation

## 2022-04-01 DIAGNOSIS — N183 Chronic kidney disease, stage 3 unspecified: Secondary | ICD-10-CM | POA: Insufficient documentation

## 2022-04-01 DIAGNOSIS — I825Y2 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: Secondary | ICD-10-CM | POA: Diagnosis present

## 2022-04-01 DIAGNOSIS — D696 Thrombocytopenia, unspecified: Secondary | ICD-10-CM | POA: Diagnosis not present

## 2022-04-01 DIAGNOSIS — Z7901 Long term (current) use of anticoagulants: Secondary | ICD-10-CM | POA: Diagnosis not present

## 2022-04-01 DIAGNOSIS — Z79899 Other long term (current) drug therapy: Secondary | ICD-10-CM | POA: Diagnosis not present

## 2022-04-01 MED ORDER — APIXABAN 5 MG PO TABS: 5.0000 mg | ORAL_TABLET | Freq: Two times a day (BID) | ORAL | 6 refills | Status: DC

## 2022-04-01 NOTE — Assessment & Plan Note (Addendum)
#   DVT PE ?2- patient on anticoagulation  Indefinite; on 5 mg once a day. Tolerating well.  Refill Eliquis.  ?? ?# Mild chronic intermittent thrombocytopenia- ? Sec to immune process- monitor for now. JAN 2023- [[facility]]-platelets normal. ?? ?# History of nephritis/lupus/ CKD stage III- following up with nephrology. ? ?Thank you Dr.[name] for allowing me to participate in the care of your pleasant patient. Please do not hesitate to contact me with questions or concerns in the interim. ?? ?#DISPOSITION: ?# no labs ?#  follow up in 6 months- MD;/labs- cbc/cmp-Dr.B ?? ?  ? ?

## 2022-04-01 NOTE — Progress Notes (Signed)
[place] ?CONSULT NOTE ? ?Patient Care Team: ?Delsa Grana, PA-C as PCP - General (Family Medicine) ?Robert Bellow, MD (General Surgery) ?Johna Roles, PA as Orthopedic Technician (Physician Assistant) ?Anthonette Legato, MD (Internal Medicine) ? ?CHIEF COMPLAINTS/PURPOSE OF CONSULTATION: DVT/PE ?  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  ?# chronic mild thrombocytopenia 120-130s ?  ?# ? SLE/Nephritis [cellcept/prednisolone;~creat 1.5; Dr.Lateef] ? ?Oncology History  ? No history exists.  ? ? ? ?HISTORY OF PRESENTING ILLNESS: Alone.  Ambulating independently. ?Jonathon Snow 61 y.o.  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 ? ?Patient was previously followed by me; however patient decided to move to [facility] for his hematologic issues.  However recently decided to move locally for his hematologic care. ? ?Patient has not had any blood clots.  Denies any interruptions in his Eliquis. ? ?Review of Systems  ?Constitutional:  Positive for malaise/fatigue. Negative for chills, diaphoresis, fever and weight loss.  ?HENT:  Negative for nosebleeds and sore throat.   ?Eyes:  Negative for double vision.  ?Respiratory:  Negative for cough, hemoptysis, sputum production, shortness of breath and wheezing.   ?Cardiovascular:  Positive for leg swelling. Negative for chest pain, palpitations and orthopnea.  ?Gastrointestinal:  Negative for abdominal pain, blood in stool, constipation, diarrhea, heartburn, melena, nausea and vomiting.  ?Genitourinary:  Negative for dysuria, frequency and urgency.  ?Musculoskeletal:  Negative for back pain  and joint pain.  ?Skin:  Positive for rash.  ?Neurological:  Negative for dizziness, tingling, focal weakness, weakness and headaches.  ?Endo/Heme/Allergies:  Does not bruise/bleed easily.  ?Psychiatric/Behavioral:  Negative for depression. The patient is not nervous/anxious and does not have insomnia.    ? ?MEDICAL HISTORY:  ?Past Medical History:  ?Diagnosis Date  ? Calculus of kidney 08/21/2013  ? 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 Overview:  superior sagittal sinus, left transverse sinus and cortical veins   ? COVID-19 virus infection 12/2020  ? Depression   ? DVT (deep venous thrombosis) ([place])   ? Dyspnea   ? GERD (gastroesophageal reflux disease)   ? Heel spur, left 02/16/2019  ? Heel spur, right 02/16/2019  ? Herpes zoster infection of lumbar region 02/20/2020  ? Hyperlipidemia   ? Hypertension   ? Lupus ([place])   ? Lymphedema 10/07/2018  ? Morbid obesity ([place])   ? Opiate abuse, episodic ([place]) 02/26/2018  ? Osteoporosis   ? Pneumonia   ? PONV (postoperative nausea and vomiting)   ? 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 Presence of IVC filter 03/22/2020  ? Removed  ? Pulmonary embolism ([place])   ? Renal disorder   ? Stage III  ? ? ?SURGICAL HISTORY: ?Past Surgical History:  ?Procedure Laterality Date  ? ANKLE SURGERY Right   ? BACK SURGERY    ? BRONCHIAL WASHINGS N/A 11/01/2021  ? Procedure: BRONCHIAL WASHINGS;  Surgeon: [name], MD;  Location: [facility] ORS;  Service: Thoracic;  Laterality: N/A;  ? COLONOSCOPY WITH PROPOFOL N/A 05/28/2020  ? Procedure: COLONOSCOPY WITH PROPOFOL;  Surgeon: Jonathon Bellows, MD;  Location: [facility] ENDOSCOPY;  Service: Endoscopy;  Laterality: N/A;  ? CYST EXCISION  92 or 93   ?  Liver cyst removal [facility]  ? FLEXIBLE BRONCHOSCOPY N/A 11/01/2021  ? Procedure: FLEXIBLE BRONCHOSCOPY;  Surgeon: [name], MD;  Location: [facility] ORS;  Service: Thoracic;  Laterality: N/A;  ? I & D EXTREMITY Right 04/29/2017  ? Procedure: IRRIGATION AND DEBRIDEMENT EXTREMITY;   Surgeon: Clayburn Pert, MD;  Location: [facility] ORS;  Service: General;  Laterality: Right;  ? IRRIGATION AND DEBRIDEMENT ABSCESS Left 04/29/2017  ? Procedure: IRRIGATION AND DEBRIDEMENT Scrotal ABSCESS;  Surgeon: Clayburn Pert, MD;  Location: [facility] ORS;  Service: General;  Laterality: Left;  ? ? ?SOCIAL HISTORY: ?Social History  ? ?Socioeconomic History  ? Marital status: Divorced  ?  Spouse name: Not on file  ? Number of children: Not on file  ? Years of education: Not on file  ? Highest education level: Not on file  ?Occupational History  ? Not on file  ?Tobacco Use  ? Smoking status: Never  ? Smokeless tobacco: Never  ?Vaping Use  ? Vaping Use: Never used  ?Substance and Sexual Activity  ? Alcohol use: No  ? Drug use: No  ?  Comment: PT DENIES  ? Sexual activity: Yes  ?Other Topics Concern  ? Not on file  ?Social History Narrative  ? Not on file  ? ?Social Determinants of Health  ? ?Financial Resource Strain: Not on file  ?Food Insecurity: Not on file  ?Transportation Needs: Not on file  ?Physical Activity: Not on file  ?Stress: Not on file  ?Social Connections: Not on file  ?Intimate Partner Violence: Not on file  ? ? ?FAMILY HISTORY: ?Family History  ?Problem Relation Age of Onset  ? Hypertension Father   ? Heart disease Father   ? Clotting disorder Mother   ? Kidney disease Brother   ? Heart attack Maternal Grandmother   ? Heart attack Maternal Grandfather   ? Heart attack Paternal Grandfather   ? ? ?ALLERGIES:  is allergic to enalapril and vicodin [hydrocodone-acetaminophen]. ? ?MEDICATIONS:  ?Current Outpatient Medications  ?Medication Sig Dispense Refill  ? albuterol (VENTOLIN HFA) 108 (90 Base) MCG/ACT inhaler Inhale 2 puffs into the lungs every 6 (six) hours as needed for wheezing or shortness of breath. 18 g 2  ? ALPRAZolam (XANAX) 0.5 MG tablet Take one tab one hour prior to procedure and one tab when you arrive in office 2 tablet 0  ? amLODipine (NORVASC) 5 MG tablet Take 1 tablet (5 mg total) by  mouth daily. 90 tablet 3  ? cholecalciferol (VITAMIN D3) 25 MCG (1000 UNIT) tablet Take 1,000 Units by mouth daily.    ? losartan (COZAAR) 100 MG tablet Take 1 tablet (100 mg total) by mouth daily. 90 tablet 1  ? montelukast (SINGULAIR) 10 MG tablet Take 1 tablet (10 mg total) by mouth at bedtime. 90 tablet 3  ? Multiple Vitamins-Minerals (MULTIVITAMIN WITH MINERALS) tablet Take 1 tablet by mouth daily.    ? mycophenolate (CELLCEPT) 500 MG tablet Take 1,000 mg by mouth 2 (two) times daily.    ? predniSONE (DELTASONE) 10 MG tablet Take 10 mg by mouth daily with breakfast.    ? rosuvastatin (CRESTOR) 20 MG tablet Take one pill every other night before bed. 45 tablet 1  ? sertraline (ZOLOFT) 25 MG tablet Take 1 tablet (25 mg total) by mouth daily. 90 tablet 1  ? sildenafil (REVATIO) 20 MG tablet 3-5 tablets as needed 30 min prior to intercourse 30 tablet 11  ? apixaban (ELIQUIS) 5 MG TABS tablet Take 1 tablet (5 mg total) by  mouth 2 (two) times daily. 60 tablet 6  ? ?No current facility-administered medications for this visit.  ? ? ?  ? ?PHYSICAL EXAMINATION: ? ?Vitals:  ? 04/01/22 1124  ?BP: (!) 144/92  ?Pulse: 84  ?Temp: 98.1 ?F (36.7 ?C)  ?SpO2: 98%  ? ?Filed Weights  ? 04/01/22 1124  ?Weight: 247 lb 12.8 oz (112.4 kg)  ? ? ?Physical Exam ?Vitals and nursing note reviewed.  ?HENT:  ?   Head: Normocephalic and atraumatic.  ?   Mouth/Throat:  ?   Pharynx: Oropharynx is clear.  ?Eyes:  ?   Extraocular Movements: Extraocular movements intact.  ?   Pupils: Pupils are equal, round, and reactive to light.  ?Cardiovascular:  ?   Rate and Rhythm: Normal rate and regular rhythm.  ?Pulmonary:  ?   Comments: Decreased breath sounds bilaterally.  ?Abdominal:  ?   Palpations: Abdomen is soft.  ?Musculoskeletal:     ?   General: Normal range of motion.  ?   Cervical back: Normal range of motion.  ?Skin: ?   General: Skin is warm.  ?Neurological:  ?   General: No focal deficit present.  ?   Mental Status: He is alert and oriented  to person, place, and time.  ?Psychiatric:     ?   Behavior: Behavior normal.     ?   Judgment: Judgment normal.  ? ? ? ?LABORATORY DATA:  ?I have reviewed the data as listed ?Lab Results  ?Component Value D

## 2022-04-08 ENCOUNTER — Ambulatory Visit (INDEPENDENT_AMBULATORY_CARE_PROVIDER_SITE_OTHER): Payer: 59 | Admitting: Vascular Surgery

## 2022-04-09 ENCOUNTER — Encounter: Payer: Self-pay | Admitting: *Deleted

## 2022-04-09 DIAGNOSIS — J45909 Unspecified asthma, uncomplicated: Secondary | ICD-10-CM | POA: Diagnosis not present

## 2022-04-09 NOTE — Progress Notes (Signed)
Pulmonary Individual Treatment Plan ? ?Patient Details  ?Name: Jonathon Snow ?MRN: [id number] ?Date of Birth: 1961/09/09 ?Referring Provider:   ?Flowsheet Row Pulmonary Rehab from 12/04/2021 in [facility] Cardiac and Pulmonary Rehab  ?Referring Provider [name] MD  ? ?  ? ? ?Initial Encounter Date:  ?Flowsheet Row Pulmonary Rehab from 12/04/2021 in [facility] Cardiac and Pulmonary Rehab  ?Date 12/04/21  ? ?  ? ? ?Visit Diagnosis: Asthma, unspecified asthma severity, unspecified whether complicated, unspecified whether persistent ? ?Patient's Home Medications on Admission: ? ?Current Outpatient Medications:  ?  albuterol (VENTOLIN HFA) 108 (90 Base) MCG/ACT inhaler, Inhale 2 puffs into the lungs every 6 (six) hours as needed for wheezing or shortness of breath., Disp: 18 g, Rfl: 2 ?  ALPRAZolam (XANAX) 0.5 MG tablet, Take one tab one hour prior to procedure and one tab when you arrive in office, Disp: 2 tablet, Rfl: 0 ?  amLODipine (NORVASC) 5 MG tablet, Take 1 tablet (5 mg total) by mouth daily., Disp: 90 tablet, Rfl: 3 ?  apixaban (ELIQUIS) 5 MG TABS tablet, Take 1 tablet (5 mg total) by mouth 2 (two) times daily., Disp: 60 tablet, Rfl: 6 ?  cholecalciferol (VITAMIN D3) 25 MCG (1000 UNIT) tablet, Take 1,000 Units by mouth daily., Disp: , Rfl:  ?  losartan (COZAAR) 100 MG tablet, Take 1 tablet (100 mg total) by mouth daily., Disp: 90 tablet, Rfl: 1 ?  montelukast (SINGULAIR) 10 MG tablet, Take 1 tablet (10 mg total) by mouth at bedtime., Disp: 90 tablet, Rfl: 3 ?  Multiple Vitamins-Minerals (MULTIVITAMIN WITH MINERALS) tablet, Take 1 tablet by mouth daily., Disp: , Rfl:  ?  mycophenolate (CELLCEPT) 500 MG tablet, Take 1,000 mg by mouth 2 (two) times daily., Disp: , Rfl:  ?  predniSONE (DELTASONE) 10 MG tablet, Take 10 mg by mouth daily with breakfast., Disp: , Rfl:  ?  rosuvastatin (CRESTOR) 20 MG tablet, Take one pill every other night before bed., Disp: 45 tablet, Rfl: 1 ?  sertraline (ZOLOFT) 25 MG tablet, Take 1  tablet (25 mg total) by mouth daily., Disp: 90 tablet, Rfl: 1 ?  sildenafil (REVATIO) 20 MG tablet, 3-5 tablets as needed 30 min prior to intercourse, Disp: 30 tablet, Rfl: 11 ? ?Past Medical History: ?Past Medical History:  ?Diagnosis Date  ? Calculus of kidney 08/21/2013  ? 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 Overview:  superior sagittal sinus, left transverse sinus and cortical veins   ? COVID-19 virus infection 12/2020  ? Depression   ? DVT (deep venous thrombosis) ([place])   ? Dyspnea   ? GERD (gastroesophageal reflux disease)   ? Heel spur, left 02/16/2019  ? Heel spur, right 02/16/2019  ? Herpes zoster infection of lumbar region 02/20/2020  ? Hyperlipidemia   ? Hypertension   ? Lupus ([place])   ? Lymphedema 10/07/2018  ? Morbid obesity ([place])   ? Opiate abuse, episodic ([place]) 02/26/2018  ? Osteoporosis   ? Pneumonia   ? PONV (postoperative nausea and vomiting)   ? 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 Presence of IVC filter 03/22/2020  ? Removed  ? Pulmonary embolism ([place])   ? Renal disorder   ? Stage III  ? ? ?Tobacco Use: ?Social History  ? ?Tobacco Use  ?Smoking Status Never  ?Smokeless Tobacco Never  ? ? ?Labs: ?Review Flowsheet   ? ?  ?  Latest Ref Rng & Units 02/15/2019 01/17/2020 11/07/2020 03/19/2021  ?Labs for ITP Cardiac and Pulmonary Rehab  ?  Cholestrol <200 mg/dL 244   227   275     ?LDL (calc) mg/dL (calc) 169   159   196     ?HDL-C > OR = 40 mg/dL 50   46   55     ?Trlycerides <150 mg/dL 119   108   107     ?Hemoglobin A1c <5.7 % of total Hgb   5.8   5.8    ? ?  09/18/2021  ?Labs for ITP Cardiac and Pulmonary Rehab  ?Cholestrol 139    ?LDL (calc) 76    ?HDL-C 45    ?Trlycerides 92    ?Hemoglobin A1c 5.7    ?  ? ? Multiple values from one day are sorted in reverse-chronological order  ?  ?  ? ? ? ?Pulmonary Assessment Scores: ? Pulmonary Assessment Scores   ? ? [place] Name 12/04/21 1237  ?  ?  ?  ? ADL UCSD  ? ADL Phase Entry    ? SOB Score total 79    ? Rest 1    ? Walk 5    ?  Stairs 5    ? Bath 2    ? Dress 2    ? Shop 3    ?  ? CAT Score  ? CAT Score 20    ?  ? mMRC Score  ? mMRC Score 3    ? ?  ?  ? ?  ?  ?UCSD: ?Self-administered rating of dyspnea associated with activities of daily living (ADLs) ?6-point scale (0 = "not at all" to 5 = "maximal or unable to do because of breathlessness")  ?Scoring Scores range from 0 to 120.  Minimally important difference is 5 units ? ?CAT: ?CAT can identify the health impairment of COPD patients and is better correlated with disease progression.  ?CAT has a scoring range of zero to 40. The CAT score is classified into four groups of low (less than 10), medium (10 - 20), high (21-30) and very high (31-40) based on the impact level of disease on health status. A CAT score over 10 suggests significant symptoms.  A worsening CAT score could be explained by an exacerbation, poor medication adherence, poor inhaler technique, or progression of COPD or comorbid conditions.  ?CAT MCID is 2 points ? ?mMRC: ?mMRC (Modified Medical Research Council) Dyspnea Scale is used to assess the degree of baseline functional disability in patients of respiratory disease due to dyspnea. ?No minimal important difference is established. A decrease in score of 1 point or greater is considered a positive change.  ? ?Pulmonary Function Assessment: ? Pulmonary Function Assessment - 10/23/21 1538   ? ?  ? Breath  ? Shortness of Breath Yes;Limiting activity;Panic with Shortness of Breath   ? ?  ?  ? ?  ? ? ?Exercise Target Goals: ?Exercise Program Goal: ?Individual exercise prescription set using results from initial 6 min walk test and THRR while considering  patient?s activity barriers and safety.  ? ?Exercise Prescription Goal: ?Initial exercise prescription builds to 30-45 minutes a day of aerobic activity, 2-3 days per week.  Home exercise guidelines will be given to patient during program as part of exercise prescription that the participant will acknowledge. ? ?Education:  Aerobic Exercise: ?- Group verbal and visual presentation on the components of exercise prescription. Introduces F.I.T.T principle from ACSM for exercise prescriptions.  Reviews F.I.T.T. principles of aerobic exercise including progression. Written material given at graduation. ?Flowsheet Row Pulmonary Rehab from 02/27/2022  in [facility] Cardiac and Pulmonary Rehab  ?Date 12/19/21  ?Educator [facility]  ?Instruction Review Code 1- Verbalizes Understanding  ? ?  ? ? ?Education: Resistance Exercise: ?- Group verbal and visual presentation on the components of exercise prescription. Introduces F.I.T.T principle from ACSM for exercise prescriptions  Reviews F.I.T.T. principles of resistance exercise including progression. Written material given at graduation. ?Flowsheet Row Pulmonary Rehab from 02/27/2022 in [facility] Cardiac and Pulmonary Rehab  ?Date 12/26/21  ?Educator [facility]  ?Instruction Review Code 1- Verbalizes Understanding  ? ?  ? ?  ?Education: Exercise & Equipment Safety: ?- Individual verbal instruction and demonstration of equipment use and safety with use of the equipment. ?Flowsheet Row Pulmonary Rehab from 10/23/2021 in [facility] Cardiac and Pulmonary Rehab  ?Date 10/23/21  ?Educator [facility]  ?Instruction Review Code 1- Verbalizes Understanding  ? ?  ? ? ?Education: Exercise Physiology & General Exercise Guidelines: ?- Group verbal and written instruction with models to review the exercise physiology of the cardiovascular system and associated critical values. Provides general exercise guidelines with specific guidelines to those with heart or lung disease.  ?Flowsheet Row Pulmonary Rehab from 12/04/2021 in [facility] Cardiac and Pulmonary Rehab  ?Education need identified 12/04/21  ? ?  ? ? ?Education: Flexibility, Balance, Mind/Body Relaxation: ?- Group verbal and visual presentation with interactive activity on the components of exercise prescription. Introduces F.I.T.T principle from ACSM for exercise prescriptions. Reviews F.I.T.T. principles  of flexibility and balance exercise training including progression. Also discusses the mind body connection.  Reviews various relaxation techniques to help reduce and manage stress (i.e. Deep breathing, pro

## 2022-04-09 NOTE — Progress Notes (Signed)
Daily Session Note ? ?Patient Details  ?Name: Jonathon Snow ?MRN: [id number] ?Date of Birth: 25-Jul-1961 ?Referring Provider:   ?Flowsheet Row Pulmonary Rehab from 12/04/2021 in [facility] Cardiac and Pulmonary Rehab  ?Referring Provider [name] MD  ? ?  ? ? ?Encounter Date: 04/09/2022 ? ?Check In: ? Session Check In - 04/09/22 0759   ? ?  ? Check-In  ? Supervising physician immediately available to respond to emergencies See telemetry face sheet for immediately available ER MD   ? Location [facility]-Cardiac & Pulmonary Rehab   ? Staff Present Birdie Sons, MPA, RN;Joseph [place], RCP,RRT,BSRT;Amanda Sommer, BA, ACSM CEP, Exercise Physiologist   ? Virtual Visit No   ? Medication changes reported     No   ? Fall or balance concerns reported    No   ? Warm-up and Cool-down Performed on first and last piece of equipment   ? Resistance Training Performed Yes   ? VAD Patient? No   ? PAD/SET Patient? No   ?  ? Pain Assessment  ? Currently in Pain? No/denies   ? ?  ?  ? ?  ? ? ? ? ? ?Social History  ? ?Tobacco Use  ?Smoking Status Never  ?Smokeless Tobacco Never  ? ? ?Goals Met:  ?Independence with exercise equipment ?Exercise tolerated well ?No report of concerns or symptoms today ?Strength training completed today ? ?Goals Unmet:  ?Not Applicable ? ?Comments: Pt able to follow exercise prescription today without complaint.  Will continue to monitor for progression. ? ? ? ?Dr. Emily Filbert is Medical Director for [place].  ?Dr. [name] is Medical Director for [facility] Pulmonary Rehabilitation. ?

## 2022-04-10 ENCOUNTER — Ambulatory Visit: Payer: 59

## 2022-04-11 ENCOUNTER — Ambulatory Visit: Payer: 59

## 2022-04-11 ENCOUNTER — Telehealth (INDEPENDENT_AMBULATORY_CARE_PROVIDER_SITE_OTHER): Payer: Self-pay

## 2022-04-11 NOTE — Telephone Encounter (Signed)
Patient left a message on the voicemail about laser paper work. The patient will be coming by the office to pick up the forms today and the Xanax 0.'5mg'$  #2 was left on [employer]. ?

## 2022-04-14 ENCOUNTER — Ambulatory Visit: Payer: 59

## 2022-04-15 ENCOUNTER — Encounter (INDEPENDENT_AMBULATORY_CARE_PROVIDER_SITE_OTHER): Payer: Self-pay | Admitting: Vascular Surgery

## 2022-04-15 ENCOUNTER — Ambulatory Visit: Payer: 59

## 2022-04-15 ENCOUNTER — Ambulatory Visit (INDEPENDENT_AMBULATORY_CARE_PROVIDER_SITE_OTHER): Payer: 59 | Admitting: Vascular Surgery

## 2022-04-15 VITALS — BP 158/93 | HR 82 | Resp 16 | Ht 75.0 in | Wt 247.0 lb

## 2022-04-15 DIAGNOSIS — I8312 Varicose 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: Secondary | ICD-10-CM | POA: Diagnosis not present

## 2022-04-15 NOTE — Progress Notes (Signed)
?Jonathon Snow is a 61 y.o. male who presents with symptomatic venous reflux ? ?Past Medical History:  ?Diagnosis Date  ? Calculus of kidney 08/21/2013  ? 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 Overview:  superior sagittal sinus, left transverse sinus and cortical veins   ? COVID-19 virus infection 12/2020  ? Depression   ? DVT (deep venous thrombosis) ([place])   ? Dyspnea   ? GERD (gastroesophageal reflux disease)   ? Heel spur, left 02/16/2019  ? Heel spur, right 02/16/2019  ? Herpes zoster infection of lumbar region 02/20/2020  ? Hyperlipidemia   ? Hypertension   ? Lupus ([place])   ? Lymphedema 10/07/2018  ? Morbid obesity ([place])   ? Opiate abuse, episodic ([place]) 02/26/2018  ? Osteoporosis   ? Pneumonia   ? PONV (postoperative nausea and vomiting)   ? 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 Presence of IVC filter 03/22/2020  ? Removed  ? Pulmonary embolism ([place])   ? Renal disorder   ? Stage III  ? ? ?Past Surgical History:  ?Procedure Laterality Date  ? ANKLE SURGERY Right   ? BACK SURGERY    ? BRONCHIAL WASHINGS N/A 11/01/2021  ? Procedure: BRONCHIAL WASHINGS;  Surgeon: [name], MD;  Location: [facility] ORS;  Service: Thoracic;  Laterality: N/A;  ? COLONOSCOPY WITH PROPOFOL N/A 05/28/2020  ? Procedure: COLONOSCOPY WITH PROPOFOL;  Surgeon: Jonathon Bellows, MD;  Location: [facility] ENDOSCOPY;  Service: Endoscopy;  Laterality: N/A;  ? CYST EXCISION  92 or 93   ? Liver cyst removal [facility]  ? FLEXIBLE BRONCHOSCOPY N/A 11/01/2021  ? Procedure: FLEXIBLE BRONCHOSCOPY;  Surgeon: [name], MD;  Location: [facility] ORS;  Service: Thoracic;  Laterality: N/A;  ? I & D EXTREMITY Right 04/29/2017  ? Procedure: IRRIGATION AND DEBRIDEMENT EXTREMITY;  Surgeon: Clayburn Pert, MD;  Location: [facility] ORS;  Service: General;  Laterality: Right;  ? IRRIGATION AND DEBRIDEMENT ABSCESS Left 04/29/2017  ? Procedure: IRRIGATION AND DEBRIDEMENT Scrotal ABSCESS;  Surgeon: Clayburn Pert, MD;  Location: [facility] ORS;  Service:  General;  Laterality: Left;  ? ? ? ?Current Outpatient Medications:  ?  albuterol (VENTOLIN HFA) 108 (90 Base) MCG/ACT inhaler, Inhale 2 puffs into the lungs every 6 (six) hours as needed for wheezing or shortness of breath., Disp: 18 g, Rfl: 2 ?  ALPRAZolam (XANAX) 0.5 MG tablet, Take one tab one hour prior to procedure and one tab when you arrive in office, Disp: 2 tablet, Rfl: 0 ?  amLODipine (NORVASC) 5 MG tablet, Take 1 tablet (5 mg total) by mouth daily., Disp: 90 tablet, Rfl: 3 ?  apixaban (ELIQUIS) 5 MG TABS tablet, Take 1 tablet (5 mg total) by mouth 2 (two) times daily., Disp: 60 tablet, Rfl: 6 ?  cholecalciferol (VITAMIN D3) 25 MCG (1000 UNIT) tablet, Take 1,000 Units by mouth daily., Disp: , Rfl:  ?  losartan (COZAAR) 100 MG tablet, Take 1 tablet (100 mg total) by mouth daily., Disp: 90 tablet, Rfl: 1 ?  montelukast (SINGULAIR) 10 MG tablet, Take 1 tablet (10 mg total) by mouth at bedtime., Disp: 90 tablet, Rfl: 3 ?  Multiple Vitamins-Minerals (MULTIVITAMIN WITH MINERALS) tablet, Take 1 tablet by mouth daily., Disp: , Rfl:  ?  mycophenolate (CELLCEPT) 500 MG tablet, Take 1,000 mg by mouth 2 (two) times daily., Disp: , Rfl:  ?  predniSONE (DELTASONE) 10 MG tablet, Take 10 mg by mouth daily with breakfast., Disp: , Rfl:  ?  rosuvastatin (CRESTOR) 20 MG tablet, Take  one pill every other night before bed., Disp: 45 tablet, Rfl: 1 ?  sertraline (ZOLOFT) 25 MG tablet, Take 1 tablet (25 mg total) by mouth daily., Disp: 90 tablet, Rfl: 1 ?  sildenafil (REVATIO) 20 MG tablet, 3-5 tablets as needed 30 min prior to intercourse, Disp: 30 tablet, Rfl: 11 ? ?Allergies  ?Allergen Reactions  ? Enalapril Other (See Comments)  ?  Unknown reaction  ? Vicodin [Hydrocodone-Acetaminophen] Hives and Rash  ?  Severe headaches (also) NAME BRAND ONLY PER PT CAN TAKE GENERIC  ? ? ? ?Varicose 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 ?  ? ? ?PLAN: The patient's left lower extremity was sterilely prepped and draped. The  ultrasound machine was used to visualize the saphenous vein throughout its course. A segment in the mid to upper calf was selected for access. The saphenous vein was accessed without difficulty using ultrasound guidance with a micropuncture needle. A 0.018 wire was then placed beyond the saphenofemoral junction and the needle was removed. The 65 cm sheath was then placed over the wire and the wire and dilator were removed. The laser fiber was then placed through the sheath and its tip was placed approximately 5 centimeters below the saphenofemoral junction. Tumescent anesthesia was then created with a dilute lidocaine solution. Laser energy was then delivered with constant withdrawal of the sheath and laser fiber. Approximately 1544 joules of energy were delivered over a length of 40 centimeters using a 1470 Hz VenaCure machine at 7 W. Sterile dressings were placed. The patient tolerated the procedure well without obvious complications.  ? ?Follow-up in 1 week with post-laser duplex.   ?

## 2022-04-16 ENCOUNTER — Telehealth (INDEPENDENT_AMBULATORY_CARE_PROVIDER_SITE_OTHER): Payer: Self-pay

## 2022-04-16 ENCOUNTER — Ambulatory Visit: Payer: 59

## 2022-04-16 NOTE — Telephone Encounter (Signed)
Patient left a voicemail stating that he has some bruising and more pain than expected. Patient has laser ablation on yesterday. Patient was made aware that this was to be expected and take Tylenol for pain. Patient was made aware to call the office if anything changes. ?

## 2022-04-16 NOTE — Progress Notes (Deleted)
NEUROLOGY CONSULTATION NOTE  Jonathon Snow MRN: [id number] DOB: November 12, 1961  Referring provider: Delsa Grana, PA-C Primary care provider: Delsa Grana, PA-C  Reason for consult:  weakness, tremor  Assessment/Plan:   ***   Subjective:  Jonathon Snow is a 61 year old male with SLE with glomerulonephritis, CKD IIIa, COPD, HTN, HLD and history of DVT/PE and cerebral venous sinus thrombosis on chronic AC who presents for weakness and tremor.  History supplemented by referring provider's note.   ***.  TSH from 09/18/2021 was 0.81.    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   Overview:  superior sagittal sinus, left transverse sinus and cortical veins    COVID-19 virus infection 12/2020   Depression    DVT (deep venous thrombosis) (HCC)    Dyspnea    GERD (gastroesophageal reflux disease)    Heel spur, left 02/16/2019   Heel spur, right 02/16/2019   Herpes zoster infection of lumbar region 02/20/2020   Hyperlipidemia    Hypertension    Lupus ([place])    Lymphedema 10/07/2018   Morbid obesity ([place])    Opiate abuse, episodic ([place]) 02/26/2018   Osteoporosis    Pneumonia    PONV (postoperative nausea and vomiting)    Postphlebitic syndrome with ulcer, 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   Presence of IVC filter 03/22/2020   Removed   Pulmonary embolism ([place])    Renal disorder    Stage III    PAST SURGICAL HISTORY: Past Surgical History:  Procedure Laterality Date   ANKLE SURGERY Right    BACK SURGERY     BRONCHIAL WASHINGS N/A 11/01/2021   Procedure: BRONCHIAL WASHINGS;  Surgeon: [name], MD;  Location: [facility] ORS;  Service: Thoracic;  Laterality: N/A;   COLONOSCOPY WITH PROPOFOL N/A 05/28/2020   Procedure: COLONOSCOPY WITH PROPOFOL;  Surgeon: Jonathon Bellows, MD;  Location: [facility] ENDOSCOPY;  Service: Endoscopy;  Laterality: N/A;   CYST EXCISION  92 or 93    Liver cyst removal [facility]    FLEXIBLE BRONCHOSCOPY N/A 11/01/2021   Procedure: FLEXIBLE BRONCHOSCOPY;  Surgeon: [name], MD;  Location: [facility] ORS;  Service: Thoracic;  Laterality: N/A;   I & D EXTREMITY Right 04/29/2017   Procedure: IRRIGATION AND DEBRIDEMENT EXTREMITY;  Surgeon: Clayburn Pert, MD;  Location: [facility] ORS;  Service: General;  Laterality: Right;   IRRIGATION AND DEBRIDEMENT ABSCESS Left 04/29/2017   Procedure: IRRIGATION AND DEBRIDEMENT Scrotal ABSCESS;  Surgeon: Clayburn Pert, MD;  Location: [facility] ORS;  Service: General;  Laterality: Left;    MEDICATIONS: Current Outpatient Medications on File Prior to Visit  Medication Sig Dispense Refill   albuterol (VENTOLIN HFA) 108 (90 Base) MCG/ACT inhaler Inhale 2 puffs into the lungs every 6 (six) hours as needed for wheezing or shortness of breath. 18 g 2   ALPRAZolam (XANAX) 0.5 MG tablet Take one tab one hour prior to procedure and one tab when you arrive in office 2 tablet 0   amLODipine (NORVASC) 5 MG tablet Take 1 tablet (5 mg total) by mouth daily. 90 tablet 3   apixaban (ELIQUIS) 5 MG TABS tablet Take 1 tablet (5 mg total) by mouth 2 (two) times daily. 60 tablet 6   cholecalciferol (VITAMIN D3) 25 MCG (1000 UNIT) tablet Take 1,000 Units by mouth daily.     losartan (COZAAR) 100 MG tablet Take 1 tablet (100 mg total) by mouth daily. 90 tablet 1   montelukast (SINGULAIR)  10 MG tablet Take 1 tablet (10 mg total) by mouth at bedtime. 90 tablet 3   Multiple Vitamins-Minerals (MULTIVITAMIN WITH MINERALS) tablet Take 1 tablet by mouth daily.     mycophenolate (CELLCEPT) 500 MG tablet Take 1,000 mg by mouth 2 (two) times daily.     predniSONE (DELTASONE) 10 MG tablet Take 10 mg by mouth daily with breakfast.     rosuvastatin (CRESTOR) 20 MG tablet Take one pill every other night before bed. 45 tablet 1   sertraline (ZOLOFT) 25 MG tablet Take 1 tablet (25 mg total) by mouth daily. 90 tablet 1   sildenafil (REVATIO) 20 MG tablet 3-5 tablets as needed 30 min  prior to intercourse 30 tablet 11   No current facility-administered medications on file prior to visit.    ALLERGIES: Allergies  Allergen Reactions   Enalapril Other (See Comments)    Unknown reaction   Vicodin [Hydrocodone-Acetaminophen] Hives and Rash    Severe headaches (also) NAME BRAND ONLY PER PT CAN TAKE GENERIC    FAMILY HISTORY: Family History  Problem Relation Age of Onset   Hypertension Father    Heart disease Father    Clotting disorder Mother    Kidney disease Brother    Heart attack Maternal Grandmother    Heart attack Maternal Grandfather    Heart attack Paternal Grandfather     Objective:  *** General: No acute distress.  Patient appears well-groomed.   Head:  Normocephalic/atraumatic Eyes:  fundi examined but not visualized Neck: supple, no paraspinal tenderness, full range of motion Back: No paraspinal tenderness Heart: regular rate and rhythm Lungs: Clear to auscultation bilaterally. Vascular: No carotid bruits. Neurological Exam: Mental status: alert and oriented to person, place, and time, recent and remote memory intact, fund of knowledge intact, attention and concentration intact, speech fluent and not dysarthric, language intact. Cranial nerves: CN I: not tested CN II: pupils equal, round and reactive to light, visual fields intact CN III, IV, VI:  full range of motion, no nystagmus, no ptosis CN V: facial sensation intact. CN VII: upper and lower face symmetric CN VIII: hearing intact CN IX, X: gag intact, uvula midline CN XI: sternocleidomastoid and trapezius muscles intact CN XII: tongue midline Bulk & Tone: normal, no fasciculations. Motor:  muscle strength 5/5 throughout Sensation:  Pinprick, temperature and vibratory sensation intact. Deep Tendon Reflexes:  2+ throughout,  toes downgoing.   Finger to nose testing:  Without dysmetria.   Heel to shin:  Without dysmetria.   Gait:  Normal station and stride.  Romberg  negative.    Thank you for allowing me to take part in the care of this patient.  Metta Clines, DO  CC: ***

## 2022-04-17 ENCOUNTER — Ambulatory Visit: Payer: 59

## 2022-04-18 ENCOUNTER — Ambulatory Visit: Payer: 59 | Admitting: Neurology

## 2022-04-21 ENCOUNTER — Ambulatory Visit: Payer: 59

## 2022-04-22 ENCOUNTER — Other Ambulatory Visit (INDEPENDENT_AMBULATORY_CARE_PROVIDER_SITE_OTHER): Payer: Self-pay | Admitting: Vascular Surgery

## 2022-04-22 ENCOUNTER — Ambulatory Visit (INDEPENDENT_AMBULATORY_CARE_PROVIDER_SITE_OTHER): Payer: 59

## 2022-04-22 ENCOUNTER — Ambulatory Visit: Payer: 59

## 2022-04-22 DIAGNOSIS — I8312 Varicose 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: Secondary | ICD-10-CM

## 2022-04-22 MED ORDER — TRAMADOL HCL 50 MG PO TABS
50.0000 mg | ORAL_TABLET | Freq: Four times a day (QID) | ORAL | 0 refills | Status: DC | PRN
Start: 2022-04-22 — End: 2022-09-09

## 2022-04-24 ENCOUNTER — Ambulatory Visit: Payer: 59

## 2022-04-25 ENCOUNTER — Ambulatory Visit (INDEPENDENT_AMBULATORY_CARE_PROVIDER_SITE_OTHER): Payer: 59 | Admitting: Urology

## 2022-04-25 VITALS — BP 144/88 | HR 94 | Ht 74.0 in | Wt 248.0 lb

## 2022-04-25 DIAGNOSIS — R3129 Other microscopic hematuria: Secondary | ICD-10-CM | POA: Diagnosis not present

## 2022-04-25 DIAGNOSIS — N492 Inflammatory disorders of scrotum: Secondary | ICD-10-CM | POA: Diagnosis not present

## 2022-04-25 DIAGNOSIS — N342 Other urethritis: Secondary | ICD-10-CM | POA: Diagnosis not present

## 2022-04-25 DIAGNOSIS — N529 Male erectile dysfunction, unspecified: Secondary | ICD-10-CM | POA: Diagnosis not present

## 2022-04-25 LAB — MICROSCOPIC EXAMINATION: WBC, UA: >30 /hpf — AB (ref 0–5)

## 2022-04-25 LAB — URINALYSIS, COMPLETE
Bilirubin, UA: NEGATIVE
Glucose, UA: NEGATIVE
Ketones, UA: NEGATIVE
Nitrite, UA: NEGATIVE
Specific Gravity, UA: 1.015 (ref 1.005–1.030)
Urobilinogen, Ur: 0.2 mg/dL (ref 0.2–1.0)
pH, UA: 5.5 (ref 5.0–7.5)

## 2022-04-25 MED ORDER — DOXYCYCLINE HYCLATE 100 MG PO CAPS: 100.0000 mg | ORAL_CAPSULE | Freq: Two times a day (BID) | ORAL | 0 refills | Status: DC

## 2022-04-25 MED ORDER — CEFTRIAXONE SODIUM 1 G IJ SOLR
1.0000 g | INTRAMUSCULAR | Status: DC
  Administered 2022-04-25: 1 g via INTRAMUSCULAR

## 2022-04-25 MED ORDER — CEFTRIAXONE SODIUM 1 G IJ SOLR: 1.0000 g | Freq: Once | INTRAMUSCULAR | Status: DC

## 2022-04-25 NOTE — Progress Notes (Signed)
04/27/22 ?8:59 PM  ? ?Jonathon Snow ?1961-05-04 ?[id number] ? ?Referring provider:  ?Delsa Grana, PA-C ?[address] ?[address] ?[place],  [place] [address] ? ?Chief Complaint  ?Patient presents with  ? Dysuria  ? ? ?Urological history  ?1. Scrotal abscess ?-I&D left scrotal abscess 2018 ? ?2. High risk hematuria ?-non-smoker ?-non-contrast CT 2019 - NED ?-cysto 2018 -enlarged prostate with prominent prostatic utricle  ?-no gross heme ?-UA 3-10 RBC's ? ?3. BPH with LU TS ?-contributing factors of age, depression, HTN, obesity, lymphedema, COPD and antihistamines ?-PSA 0.42, 08/2021 ? ?4. Erectile dysfunction ?-contributing factors of age, depression, BPH, HTN, COPD, HLD and opiate abuse ? ?HPI: ?Jonathon Snow is a 61 y.o.male who presents today for further evaluation of discharge and discomfort when urinating.  ? ?He reports that he has whitish discharge from his penis and redness which he attributes to this. He has some soreness also. This started 4 days ago. He symptoms were mild at first but has progressed. He is sexually active in a monogamous relationship.  He has not placed any foreign objects in his urethra.   ? ?Patient denies any modifying or aggravating factors.  Patient denies any gross hematuria, dysuria or suprapubic/flank pain.  Patient denies any fevers, chills, nausea or vomiting.   ? ?UA > 30 wbcs, 3-10 rbcs, and a few bacteria.  ? ?PMH: ?Past Medical History:  ?Diagnosis Date  ? Calculus of kidney 08/21/2013  ? 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 Overview:  superior sagittal sinus, left transverse sinus and cortical veins   ? COVID-19 virus infection 12/2020  ? Depression   ? DVT (deep venous thrombosis) ([place])   ? Dyspnea   ? GERD (gastroesophageal reflux disease)   ? Heel spur, left 02/16/2019  ? Heel spur, right 02/16/2019  ? Herpes zoster infection of lumbar region 02/20/2020  ? Hyperlipidemia   ? Hypertension   ? Lupus ([place])   ? Lymphedema 10/07/2018  ? Morbid obesity ([place])   ? Opiate  abuse, episodic ([place]) 02/26/2018  ? Osteoporosis   ? Pneumonia   ? PONV (postoperative nausea and vomiting)   ? 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 Presence of IVC filter 03/22/2020  ? Removed  ? Pulmonary embolism ([place])   ? Renal disorder   ? Stage III  ? ? ?Surgical History: ?Past Surgical History:  ?Procedure Laterality Date  ? ANKLE SURGERY Right   ? BACK SURGERY    ? BRONCHIAL WASHINGS N/A 11/01/2021  ? Procedure: BRONCHIAL WASHINGS;  Surgeon: [name], MD;  Location: [facility];  Service: Thoracic;  Laterality: N/A;  ? COLONOSCOPY WITH PROPOFOL N/A 05/28/2020  ? Procedure: COLONOSCOPY WITH PROPOFOL;  Surgeon: Jonathon Bellows, MD;  Location: [facility]COPY;  Service: Endoscopy;  Laterality: N/A;  ? CYST EXCISION  92 or 93   ? Liver cyst removal [facility]  ? FLEXIBLE BRONCHOSCOPY N/A 11/01/2021  ? Procedure: FLEXIBLE BRONCHOSCOPY;  Surgeon: [name], MD;  Location: [facility] ORS;  Service: Thoracic;  Laterality: N/A;  ? I & D EXTREMITY Right 04/29/2017  ? Procedure: IRRIGATION AND DEBRIDEMENT EXTREMITY;  Surgeon: Clayburn Pert, MD;  Location: [facility] OR[facility];  Service: General;  Laterality: Right;  ? IRRIGATION AND DEBRIDEMENT ABSCESS Left 04/29/2017  ? Procedure: IRRIGATION AND DEBRIDEMENT Scrotal ABSCESS;  Surgeon: Clayburn Pert, MD;  Location: [facility] ORS;  Service: General;  Laterality: Left;  ? ? ?Home Medications:  ?Allergies as of 04/25/2022   ? ?   Reactions  ? Enalapril Other (  See Comments)  ? Unknown reaction  ? Vicodin [hydrocodone-acetaminophen] Hives, Rash  ? Severe headaches (also) NAME BRAND ONLY PER PT CAN TAKE GENERIC  ? ?  ? ?  ?Medication List  ?  ? ?  ? Accurate as of Apr 25, 2022 11:59 PM. If you have any questions, ask your nurse or doctor.  ?  ?  ? ?  ? ?albuterol 108 (90 Base) MCG/ACT inhaler ?Commonly known as: VENTOLIN HFA ?Inhale 2 puffs into the lungs every 6 (six) hours as needed for wheezing or shortness of breath. ?  ?ALPRAZolam 0.5 MG tablet ?Commonly known as:  Xanax ?Take one tab one hour prior to procedure and one tab when you arrive in office ?  ?amLODipine 5 MG tablet ?Commonly known as: NORVASC ?Take 1 tablet (5 mg total) by mouth daily. ?  ?apixaban 5 MG Tabs tablet ?Commonly known as: Eliquis ?Take 1 tablet (5 mg total) by mouth 2 (two) times daily. ?  ?cholecalciferol 25 MCG (1000 UNIT) tablet ?Commonly known as: VITAMIN D3 ?Take 1,000 Units by mouth daily. ?  ?doxycycline 100 MG capsule ?Commonly known as: VIBRAMYCIN ?Take 1 capsule (100 mg total) by mouth every 12 (twelve) hours. ?  ?losartan 100 MG tablet ?Commonly known as: COZAAR ?Take 1 tablet (100 mg total) by mouth daily. ?  ?montelukast 10 MG tablet ?Commonly known as: SINGULAIR ?Take 1 tablet (10 mg total) by mouth at bedtime. ?  ?multivitamin with minerals tablet ?Take 1 tablet by mouth daily. ?  ?mycophenolate 500 MG tablet ?Commonly known as: CELLCEPT ?Take 1,000 mg by mouth 2 (two) times daily. ?  ?predniSONE 10 MG tablet ?Commonly known as: DELTASONE ?Take 10 mg by mouth daily with breakfast. ?  ?rosuvastatin 20 MG tablet ?Commonly known as: Crestor ?Take one pill every other night before bed. ?  ?sertraline 25 MG tablet ?Commonly known as: ZOLOFT ?Take 1 tablet (25 mg total) by mouth daily. ?  ?sildenafil 20 MG tablet ?Commonly known as: REVATIO ?3-5 tablets as needed 30 min prior to intercourse ?  ?traMADol 50 MG tablet ?Commonly known as: ULTRAM ?Take 1 tablet (50 mg total) by mouth every 6 (six) hours as needed. ?  ? ?  ? ? ?Allergies:  ?Allergies  ?Allergen Reactions  ? Enalapril Other (See Comments)  ?  Unknown reaction  ? Vicodin [Hydrocodone-Acetaminophen] Hives and Rash  ?  Severe headaches (also) NAME BRAND ONLY PER PT CAN TAKE GENERIC  ? ? ?Family History: ?Family History  ?Problem Relation Age of Onset  ? Hypertension Father   ? Heart disease Father   ? Clotting disorder Mother   ? Kidney disease Brother   ? Heart attack Maternal Grandmother   ? Heart attack Maternal Grandfather   ? Heart  attack Paternal Grandfather   ? ? ?Social History:  reports that he has never smoked. He has never used smokeless tobacco. He reports that he does not drink alcohol and does not use drugs. ? ? ?Physical Exam: ?BP (!) 144/88   Pulse 94   Ht _0  (1.88 m)   Wt 248 lb (112.5 kg)   BMI 31.84 kg/m?   ?Constitutional:  Alert and oriented, No acute distress. ?HEENT: [place] AT, moist mucus membranes.  Trachea midline, no masses. ?Cardiovascular: No clubbing, cyanosis, or edema. ?Respiratory: Normal respiratory effort, no increased work of breathing. ?GU:  No CVA tenderness.  No bladder fullness or masses.  Patient with uncircumcised phallus. Foreskin easily retracted  Urethral meatus is patentPurulent discharge  from meatus,  entire penis tender to palpation.Scrotum without lesions, cysts, rashes and/or edema.  Bilateral hydroceles, testicles are located scrotally bilaterally. No masses are appreciated in the testicles. Left and right epididymis are normal.  ?Skin: No rashes, bruises or suspicious lesions. ?Neurologic: Grossly intact, no focal deficits, moving all 4 extremities. ?Psychiatric: Normal mood and affect. ? ?Laboratory Data: ? ?  Latest Ref Rng & Units 09/18/2021  ?  3:05 PM 09/10/2021  ? 10:52 AM 09/09/2021  ?  5:34 PM  ?CMP  ?Glucose 65 - 99 mg/dL 102   107   100    ?BUN 7 - 25 mg/dL 24   29   32    ?Creatinine 0.70 - 1.30 mg/dL 1.64   1.96   2.41    ?Sodium 135 - 146 mmol/L 143   138   137    ?Potassium 3.5 - 5.3 mmol/L 5.1   3.8   4.5    ?Chloride 98 - 110 mmol/L 109   110   107    ?CO2 20 - 32 mmol/L _0 ?Calcium 8.6 - 10.3 mg/dL 9.7   8.6   9.5    ?Total Protein 6.1 - 8.1 g/dL 7.2    7.7    ?Total Bilirubin 0.2 - 1.2 mg/dL 0.6    0.8    ?Alkaline Phos 38 - 126 U/L   93    ?AST 10 - 35 U/L 17    21    ?ALT 9 - 46 U/L 15    19    ?  ? ?  Latest Ref Rng & Units 09/18/2021  ?  3:05 PM 09/10/2021  ? 10:52 AM 09/09/2021  ?  5:34 PM  ?CBC  ?WBC 3.8 - 10.8 Thousand/uL 6.0   5.2   7.5    ?Hemoglobin 13.2 -  17.1 g/dL 13.2   11.8   13.6    ?Hematocrit 38.5 - 50.0 % 41.5   36.7   42.2    ?Platelets 140 - 400 Thousand/uL 263   187   239    ?  ?Urinalysis ?Component ?    Latest Ref Rng 04/25/2022  ?Glucose, UA ?    [place]

## 2022-04-25 NOTE — Progress Notes (Signed)
IM Injection ? ?Patient is present today for an IM Injection for treatment of URETHITIS.  ?Drug: Ceftriaxone  ?Dose:1g ?Location:Right upper outer buttocks ?Lot: [id number] ?Exp:04/2024 ?Patient tolerated well, no complications were noted ? ?Performed by: Bradly Bienenstock CMA ? ? ?

## 2022-04-27 ENCOUNTER — Encounter: Payer: Self-pay | Admitting: Urology

## 2022-04-28 ENCOUNTER — Ambulatory Visit: Payer: 59

## 2022-04-28 ENCOUNTER — Telehealth: Payer: Self-pay

## 2022-04-28 LAB — CULTURE, URINE COMPREHENSIVE

## 2022-04-28 LAB — GC/CHLAMYDIA PROBE AMP
Chlamydia trachomatis, NAA: NEGATIVE
Neisseria Gonorrhoeae by PCR: POSITIVE — AB

## 2022-04-28 NOTE — Telephone Encounter (Signed)
Advised pt of results and message below. Patient expressed understanding.  ?

## 2022-04-28 NOTE — Telephone Encounter (Signed)
-----   Message from Nori Riis, PA-C sent at 04/28/2022 11:11 AM EDT ----- ?Please let Mr. Arif know that his culture was positive for gonorrhea and he needs to finish the doxycycline prescription completely and he needs to notify his sexual partners and they need to be tested as well.   ?

## 2022-04-28 NOTE — Telephone Encounter (Signed)
Attempted to reach pt, VM full. Will attempt again later.  ?

## 2022-04-29 ENCOUNTER — Ambulatory Visit (INDEPENDENT_AMBULATORY_CARE_PROVIDER_SITE_OTHER): Payer: 59 | Admitting: Urology

## 2022-04-29 ENCOUNTER — Ambulatory Visit: Payer: 59

## 2022-04-29 VITALS — BP 133/91 | HR 81 | Ht 74.5 in | Wt 248.0 lb

## 2022-04-29 DIAGNOSIS — N529 Male erectile dysfunction, unspecified: Secondary | ICD-10-CM | POA: Diagnosis not present

## 2022-04-29 DIAGNOSIS — A549 Gonococcal infection, unspecified: Secondary | ICD-10-CM | POA: Diagnosis not present

## 2022-04-29 DIAGNOSIS — R350 Frequency of micturition: Secondary | ICD-10-CM | POA: Diagnosis not present

## 2022-04-29 LAB — BLADDER SCAN AMB NON-IMAGING: Scan Result: 0

## 2022-04-29 NOTE — Patient Instructions (Signed)
Cystoscopy Cystoscopy is a procedure that is used to help diagnose and sometimes treat conditions that affect the lower urinary tract. The lower urinary tract includes the bladder and the urethra. The urethra is the tube that drains urine from the bladder. Cystoscopy is done using a thin, tube-shaped instrument with a light and camera at the end (cystoscope). The cystoscope may be hard or flexible, depending on the goal of the procedure. The cystoscope is inserted through the urethra, into the bladder. Cystoscopy may be recommended if you have: Urinary tract infections that keep coming back. Blood in the urine (hematuria). An inability to control when you urinate (urinary incontinence) or an overactive bladder. Unusual cells found in a urine sample. A blockage in the urethra, such as a urinary stone. Painful urination. An abnormality in the bladder found during an intravenous pyelogram (IVP) or CT scan. What are the risks? Generally, this is a safe procedure. However, problems may occur, including: Infection. Bleeding.  What happens during the procedure?  You will be given one or more of the following: A medicine to numb the area (local anesthetic). The area around the opening of your urethra will be cleaned. The cystoscope will be passed through your urethra into your bladder. Germ-free (sterile) fluid will flow through the cystoscope to fill your bladder. The fluid will stretch your bladder so that your health care provider can clearly examine your bladder walls. Your doctor will look at the urethra and bladder. The cystoscope will be removed The procedure may vary among health care providers  What can I expect after the procedure? After the procedure, it is common to have: Some soreness or pain in your urethra. Urinary symptoms. These include: Mild pain or burning when you urinate. Pain should stop within a few minutes after you urinate. This may last for up to a few days after the  procedure. A small amount of blood in your urine for several days. Feeling like you need to urinate but producing only a small amount of urine. Follow these instructions at home: General instructions Return to your normal activities as told by your health care provider.  Drink plenty of fluids after the procedure. Keep all follow-up visits as told by your health care provider. This is important. Contact a health care provider if you: Have pain that gets worse or does not get better with medicine, especially pain when you urinate lasting longer than 72 hours after the procedure. Have trouble urinating. Get help right away if you: Have blood clots in your urine. Have a fever or chills. Are unable to urinate. Summary Cystoscopy is a procedure that is used to help diagnose and sometimes treat conditions that affect the lower urinary tract. Cystoscopy is done using a thin, tube-shaped instrument with a light and camera at the end. After the procedure, it is common to have some soreness or pain in your urethra. It is normal to have blood in your urine after the procedure.  If you were prescribed an antibiotic medicine, take it as told by your health care provider.  This information is not intended to replace advice given to you by your health care provider. Make sure you discuss any questions you have with your health care provider. Document Revised: 11/30/2018 Document Reviewed: 11/30/2018 Elsevier Patient Education  2020 Elsevier Inc.   Transrectal Ultrasound  A transrectal ultrasound is a procedure that uses sound waves to create images of the prostate gland and nearby tissues. For this procedure, an ultrasound probe is placed   in the rectum. The probe sends sound waves through the wall of the rectum into the prostate gland. The prostate is a walnut-sized gland that is located below the bladder and in front of the rectum. The images show the size and shape of the prostate gland and nearby  structures. You may need this test if you have: Trouble urinating. Trouble getting your partner pregnant (infertility). An abnormal result from a prostate screening exam. Tell a health care provider about: Any allergies you have. All medicines you are taking, including vitamins, herbs, eye drops, creams, and over-the-counter medicines. Any bleeding problems you have. Any surgeries you have had. Any medical conditions you have. Any prostate infections you have had. What are the risks? Generally, this is a safe procedure. However, problems may occur, including: Discomfort during the procedure. Blood in your urine or sperm after the procedure. This may occur if a sample of tissue is taken to look at under a microscope (biopsy) during the procedure. What happens before the procedure? Your health care provider may instruct you to use an enema 1-4 hours before the procedure. Follow instructions from your health care provider about how to do the enema. Ask your health care provider about: Changing or stopping your regular medicines. This is especially important if you are taking diabetes medicines or blood thinners. Taking medicines such as aspirin and ibuprofen. These medicines can thin your blood. Do not take these medicines unless your health care provider tells you to take them. Taking over-the-counter medicines, vitamins, herbs, and supplements. What happens during the procedure? You will be asked to lie down on your left side on an exam table. You will bend your knees toward your chest. Gel will be put on a small probe that is about the width of a finger. The probe will be gently inserted into your rectum. You may have a feeling of fullness but should not feel pain. The probe will send signals to a computer that will create images. These will be displayed on a monitor that looks like a small television screen. The technician will slightly rotate the probe throughout the procedure. While  rotating the probe, he or she will view and capture images of the prostate gland and the surrounding structures from different angles. Your health care provider may take a biopsy sample of prostate tissue during the procedure. The images captured from the ultrasound will help guide the needle that is used to remove a sample of tissue. The sample will be sent to a lab for testing. The probe will be removed. The procedure may vary among health care providers and hospitals. What can I expect after the procedure? It is up to you to get the results of your procedure. Ask your health care provider, or the department that is doing the procedure, when your results will be ready. Keep all follow-up visits. This is important. Summary A transrectal ultrasound is a procedure that uses sound waves to create images of the prostate gland and nearby tissues. The images show the size and shape of the prostate gland and nearby structures. Before the procedure, ask your health care provider about changing or stopping your regular medicines. This is especially important if you are taking diabetes medicines or blood thinners. This information is not intended to replace advice given to you by your health care provider. Make sure you discuss any questions you have with your health care provider. Document Revised: 08/21/2021 Document Reviewed: 06/03/2021 Elsevier Patient Education  2023 Elsevier Inc.  

## 2022-04-29 NOTE — Progress Notes (Signed)
04/29/2022 1:06 PM   Jonathon Snow 10/02/1961 [id number]  Referring provider:  Danelle Berry, PA-C [address] [place],  [name] [address] Chief Complaint  Patient presents with   Urinary Frequency     HPI: Jonathon Snow is a 61 y.o.male with a personal history of scrotal abscess s/p I &D for left scrotal abscess in 2018., high risk hematuria, BPH with LUTS, and ED who presents today for a 6 month follow-up with IPSS and PVR.   He is managed on flomax and oxybutynin 15 mg XL.   His ED is managed on sildenafil.   He was seen in clinic on 04/27/2022 by Michiel Cowboy, PA-C, he was noted to have whitish discharge, redness, and soreness form his penis that had been ongoing for 4 days.  Exam showed purulent discharge from meatus with entire penis being tender to palpation. He was given 1 dose of Rocephin in office and was treated in interim with doxycycline. UA showed >30 WBCs, 3-10 RBCs, and a few bacteria. Urine culture had no growth. He was positive for gonorrhoeae. Mycoplasma and ureaplasmaa was negative.   He reports today that his urinary symptoms are improved, still gets up about 2 times at night to urinate at baseline but still somewhat bothersome.  He also struggles with dry eyes and dry mouth from the medication.   IPSS     Row Name 04/29/22 1100         International Prostate Symptom Score   How often have you had the sensation of not emptying your bladder? Less than 1 in 5     How often have you had to urinate less than every two hours? About half the time     How often have you found you stopped and started again several times when you urinated? About half the time     How often have you found it difficult to postpone urination? About half the time     How often have you had a weak urinary stream? Less than 1 in 5 times     How often have you had to strain to start urination? Less than half the time     How many times did you typically get up at night to  urinate? 2 Times     Total IPSS Score 15       Quality of Life due to urinary symptoms   If you were to spend the rest of your life with your urinary condition just the way it is now how would you feel about that? 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   Overview:  superior sagittal sinus, left transverse sinus and cortical veins    COVID-19 virus infection 12/2020   Depression    DVT (deep venous thrombosis) (HCC)    Dyspnea    GERD (gastroesophageal reflux disease)    Heel spur, left 02/16/2019   Heel spur, right 02/16/2019   Herpes zoster infection of lumbar region 02/20/2020   Hyperlipidemia    Hypertension    Lupus (HCC)    Lymphedema 10/07/2018   Morbid obesity (HCC)    Opiate abuse, episodic (HCC) 02/26/2018   Osteoporosis    Pneumonia    PONV (postoperative nausea and vomiting)    Postphlebitic  syndrome with ulcer, 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   Presence of IVC filter 03/22/2020   Removed   Pulmonary embolism (HCC)    Renal disorder    Stage III    Surgical History: Past Surgical History:  Procedure Laterality Date   ANKLE SURGERY Right    BACK SURGERY     BRONCHIAL WASHINGS N/A 11/01/2021   Procedure: BRONCHIAL WASHINGS;  Surgeon: Vida Rigger, MD;  Location: [facility] ORS;  Service: Thoracic;  Laterality: N/A;   COLONOSCOPY WITH PROPOFOL N/A 05/28/2020   Procedure: COLONOSCOPY WITH PROPOFOL;  Surgeon: Wyline Mood, MD;  Location: [facility] ENDOSCOPY;  Service: Endoscopy;  Laterality: N/A;   CYST EXCISION  92 or 93    Liver cyst removal [facility]   FLEXIBLE BRONCHOSCOPY N/A 11/01/2021   Procedure: FLEXIBLE BRONCHOSCOPY;  Surgeon: Vida Rigger, MD;  Location: [facility] ORS;  Service: Thoracic;  Laterality: N/A;   I & D EXTREMITY Right 04/29/2017   Procedure: IRRIGATION AND DEBRIDEMENT EXTREMITY;  Surgeon: Ricarda Frame, MD;  Location: [facility] ORS;  Service: General;  Laterality: Right;   IRRIGATION AND DEBRIDEMENT ABSCESS Left 04/29/2017   Procedure: IRRIGATION AND DEBRIDEMENT Scrotal ABSCESS;  Surgeon: Ricarda Frame, MD;  Location: [facility] ORS;  Service: General;  Laterality: Left;    Home Medications:  Allergies as of 04/29/2022       Reactions   Enalapril Other (See Comments)   Unknown reaction   Vicodin [hydrocodone-acetaminophen] Hives, Rash   Severe headaches (also) NAME BRAND ONLY PER PT CAN TAKE GENERIC        Medication List        Accurate as of Apr 29, 2022  1:06 PM. If you have any questions, ask your nurse or doctor.          STOP taking these medications    ALPRAZolam 0.5 MG tablet Commonly known as: Xanax       TAKE these medications    albuterol 108 (90 Base) MCG/ACT inhaler Commonly known as: VENTOLIN HFA Inhale 2 puffs into the lungs every 6 (six) hours as needed for wheezing or shortness of breath.   amLODipine 5 MG tablet Commonly known as: NORVASC Take 1 tablet (5 mg total) by mouth daily.   apixaban 5 MG Tabs tablet Commonly known as: Eliquis Take 1 tablet (5 mg total) by mouth 2 (two) times daily.   cholecalciferol 25 MCG (1000 UNIT) tablet Commonly known as: VITAMIN D3 Take 1,000 Units by mouth daily.   doxycycline 100 MG capsule Commonly known as: VIBRAMYCIN Take 1 capsule (100 mg total) by mouth every 12 (twelve) hours.   losartan 100 MG tablet Commonly known as: COZAAR Take 1 tablet (100 mg total) by mouth daily.   montelukast 10 MG tablet Commonly known as: SINGULAIR Take 1 tablet (10 mg total) by mouth at bedtime.   multivitamin with minerals tablet Take 1 tablet by mouth daily.   mycophenolate 500 MG tablet Commonly known as: CELLCEPT Take 1,000 mg by mouth 2 (two) times daily.   predniSONE 10 MG tablet Commonly known as: DELTASONE Take 10 mg by mouth daily with breakfast.   rosuvastatin 20 MG tablet Commonly known as:  Crestor Take one pill every other night before bed.   sertraline 25 MG tablet Commonly known as: ZOLOFT Take 1 tablet (25 mg total) by mouth daily.   sildenafil 20 MG tablet Commonly known as: REVATIO 3-5 tablets as needed 30 min prior to intercourse   traMADol 50 MG tablet Commonly known as: ULTRAM Take 1 tablet (50  mg total) by mouth every 6 (six) hours as needed.        Allergies:  Allergies  Allergen Reactions   Enalapril Other (See Comments)    Unknown reaction   Vicodin [Hydrocodone-Acetaminophen] Hives and Rash    Severe headaches (also) NAME BRAND ONLY PER PT CAN TAKE GENERIC    Family History: Family History  Problem Relation Age of Onset   Hypertension Father    Heart disease Father    Clotting disorder Mother    Kidney disease Brother    Heart attack Maternal Grandmother    Heart attack Maternal Grandfather    Heart attack Paternal Grandfather     Social History:  reports that he has never smoked. He has never used smokeless tobacco. He reports that he does not drink alcohol and does not use drugs.   Physical Exam: BP (!) 133/91   Pulse 81   Ht 6' 2.5" (1.892 m)   Wt 248 lb (112.5 kg)   BMI 31.42 kg/m   Constitutional:  Alert and oriented, No acute distress. HEENT: [place] AT, moist mucus membranes.  Trachea midline, no masses. Cardiovascular: No clubbing, cyanosis, or edema. Respiratory: Normal respiratory effort, no increased work of breathing. Skin: No rashes, bruises or suspicious lesions. Neurologic: Grossly intact, no focal deficits, moving all 4 extremities. Psychiatric: Normal mood and affect.  Laboratory Data:  Lab Results  Component Value Date   CREATININE 1.64 (H) 09/18/2021   Lab Results  Component Value Date   HGBA1C 5.7 (H) 09/18/2021     Pertinent Imaging: Results for orders placed or performed in visit on 04/29/22  BLADDER SCAN AMB NON-IMAGING  Result Value Ref Range   Scan Result 0 ml     Assessment & Plan:    BPH  with urinary frequency/ urgency  - He is emptying adequately with a PVR of 0ml  - Continue oxybutynin15 mg XL and Flomax; he has had improvement on these medicines  -Lengthy discussion today with the patient about whether to continue to manage his symptoms medically and/or consider further intervention and/or procedures -He is open to this, discussed the need for anatomic evaluation with cystoscopy/TRUS along with the risk and benefits of this.  He would like to pursue this.  2. Gonorrhea  - Continue doxycycline  -Vies to notify partners, has room reported to health department  3. ED  - Continue sildenafil    Schedule cystoscopy TRUS  Tawni Millers as a scribe for Vanna Scotland, MD.,have documented all relevant documentation on the behalf of Vanna Scotland, MD,as directed by  Vanna Scotland, MD while in the presence of Vanna Scotland, MD.   [facility] [address] [place], [name] [address] [phone]

## 2022-05-01 ENCOUNTER — Ambulatory Visit: Payer: 59

## 2022-05-01 LAB — MYCOPLASMA / UREAPLASMA CULTURE
Mycoplasma hominis Culture: NEGATIVE
Ureaplasma urealyticum: NEGATIVE

## 2022-05-05 ENCOUNTER — Telehealth: Payer: Self-pay

## 2022-05-05 ENCOUNTER — Ambulatory Visit: Payer: 59

## 2022-05-05 DIAGNOSIS — J45909 Unspecified asthma, uncomplicated: Secondary | ICD-10-CM

## 2022-05-05 NOTE — Telephone Encounter (Signed)
Patient is still out from rehab. Staff attempted to call patient to follow up and patient stated he has been still dealing with his back pain, doctor says he can still exercise and base it off how he feels during the time. Discussed with patient about possible discharge if can't come consistently due to his back. Patient is going to try to come tomorrow and will call us if he can't. ?

## 2022-05-06 ENCOUNTER — Ambulatory Visit: Payer: 59

## 2022-05-07 ENCOUNTER — Encounter: Payer: Self-pay | Admitting: *Deleted

## 2022-05-07 DIAGNOSIS — J45909 Unspecified asthma, uncomplicated: Secondary | ICD-10-CM

## 2022-05-07 NOTE — Progress Notes (Signed)
Pulmonary Individual Treatment Plan ? ?Patient Details  ?Name: Jonathon Snow ?MRN: [id number] ?Date of Birth: Nov 09, 1961 ?Referring Provider:   ?Flowsheet Row Pulmonary Rehab from 12/04/2021 in [facility] Cardiac and Pulmonary Rehab  ?Referring Provider [name] MD  ? ?  ? ? ?Initial Encounter Date:  ?Flowsheet Row Pulmonary Rehab from 12/04/2021 in [facility] Cardiac and Pulmonary Rehab  ?Date 12/04/21  ? ?  ? ? ?Visit Diagnosis: Asthma, unspecified asthma severity, unspecified whether complicated, unspecified whether persistent ? ?Patient's Home Medications on Admission: ? ?Current Outpatient Medications:  ?  albuterol (VENTOLIN HFA) 108 (90 Base) MCG/ACT inhaler, Inhale 2 puffs into the lungs every 6 (six) hours as needed for wheezing or shortness of breath., Disp: 18 g, Rfl: 2 ?  amLODipine (NORVASC) 5 MG tablet, Take 1 tablet (5 mg total) by mouth daily., Disp: 90 tablet, Rfl: 3 ?  apixaban (ELIQUIS) 5 MG TABS tablet, Take 1 tablet (5 mg total) by mouth 2 (two) times daily., Disp: 60 tablet, Rfl: 6 ?  cholecalciferol (VITAMIN D3) 25 MCG (1000 UNIT) tablet, Take 1,000 Units by mouth daily., Disp: , Rfl:  ?  doxycycline (VIBRAMYCIN) 100 MG capsule, Take 1 capsule (100 mg total) by mouth every 12 (twelve) hours., Disp: 14 capsule, Rfl: 0 ?  losartan (COZAAR) 100 MG tablet, Take 1 tablet (100 mg total) by mouth daily., Disp: 90 tablet, Rfl: 1 ?  montelukast (SINGULAIR) 10 MG tablet, Take 1 tablet (10 mg total) by mouth at bedtime., Disp: 90 tablet, Rfl: 3 ?  Multiple Vitamins-Minerals (MULTIVITAMIN WITH MINERALS) tablet, Take 1 tablet by mouth daily., Disp: , Rfl:  ?  mycophenolate (CELLCEPT) 500 MG tablet, Take 1,000 mg by mouth 2 (two) times daily., Disp: , Rfl:  ?  predniSONE (DELTASONE) 10 MG tablet, Take 10 mg by mouth daily with breakfast., Disp: , Rfl:  ?  rosuvastatin (CRESTOR) 20 MG tablet, Take one pill every other night before bed., Disp: 45 tablet, Rfl: 1 ?  sertraline (ZOLOFT) 25 MG tablet, Take 1 tablet  (25 mg total) by mouth daily., Disp: 90 tablet, Rfl: 1 ?  sildenafil (REVATIO) 20 MG tablet, 3-5 tablets as needed 30 min prior to intercourse, Disp: 30 tablet, Rfl: 11 ?  traMADol (ULTRAM) 50 MG tablet, Take 1 tablet (50 mg total) by mouth every 6 (six) hours as needed., Disp: 20 tablet, Rfl: 0 ? ?Current Facility-Administered Medications:  ?  cefTRIAXone (ROCEPHIN) injection 1 g, 1 g, Intramuscular, Once, McGowan, Shannon A, PA-C ? ?Past Medical History: ?Past Medical History:  ?Diagnosis Date  ? Calculus of kidney 08/21/2013  ? 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 Overview:  superior sagittal sinus, left transverse sinus and cortical veins   ? COVID-19 virus infection 12/2020  ? Depression   ? DVT (deep venous thrombosis) ([place])   ? Dyspnea   ? GERD (gastroesophageal reflux disease)   ? Heel spur, left 02/16/2019  ? Heel spur, right 02/16/2019  ? Herpes zoster infection of lumbar region 02/20/2020  ? Hyperlipidemia   ? Hypertension   ? Lupus ([place])   ? Lymphedema 10/07/2018  ? Morbid obesity ([place])   ? Opiate abuse, episodic ([place]) 02/26/2018  ? Osteoporosis   ? Pneumonia   ? PONV (postoperative nausea and vomiting)   ? Postphlebitic syndrome with ulcer, left ([place]) 11/18/2016  ? Presence of IVC filter 03/22/2020  ? Removed  ? Pulmonary embolism ([place])   ? Renal disorder   ? Stage III  ? ? ?Tobacco Use: ?Social History  ? ?  Tobacco Use  ?Smoking Status Never  ?Smokeless Tobacco Never  ? ? ?Labs: ?Review Flowsheet   ? ?  ?  Latest Ref Rng & Units 02/15/2019 01/17/2020 11/07/2020 03/19/2021  ?Labs for ITP Cardiac and Pulmonary Rehab  ?Cholestrol <200 mg/dL 244   227   275     ?LDL (calc) mg/dL (calc) 169   159   196     ?HDL-C > OR = 40 mg/dL 50   46   55     ?Trlycerides <150 mg/dL 119   108   107     ?Hemoglobin A1c <5.7 % of total Hgb   5.8   5.8    ? ?  09/18/2021  ?Labs for ITP Cardiac and Pulmonary Rehab  ?Cholestrol 139    ?LDL (calc) 76    ?HDL-C 45    ?Trlycerides 92    ?Hemoglobin A1c 5.7    ?  ?  ?   ? ? ? ?Pulmonary Assessment Scores: ? Pulmonary Assessment Scores   ? ? [place] Name 12/04/21 1237  ?  ?  ?  ? ADL UCSD  ? ADL Phase Entry    ? SOB Score total 79    ? Rest 1    ? Walk 5    ? Stairs 5    ? Bath 2    ? Dress 2    ? Shop 3    ?  ? CAT Score  ? CAT Score 20    ?  ? mMRC Score  ? mMRC Score 3    ? ?  ?  ? ?  ?  ?UCSD: ?Self-administered rating of dyspnea associated with activities of daily living (ADLs) ?6-point scale (0 = "not at all" to 5 = "maximal or unable to do because of breathlessness")  ?Scoring Scores range from 0 to 120.  Minimally important difference is 5 units ? ?CAT: ?CAT can identify the health impairment of COPD patients and is better correlated with disease progression.  ?CAT has a scoring range of zero to 40. The CAT score is classified into four groups of low (less than 10), medium (10 - 20), high (21-30) and very high (31-40) based on the impact level of disease on health status. A CAT score over 10 suggests significant symptoms.  A worsening CAT score could be explained by an exacerbation, poor medication adherence, poor inhaler technique, or progression of COPD or comorbid conditions.  ?CAT MCID is 2 points ? ?mMRC: ?mMRC (Modified Medical Research Council) Dyspnea Scale is used to assess the degree of baseline functional disability in patients of respiratory disease due to dyspnea. ?No minimal important difference is established. A decrease in score of 1 point or greater is considered a positive change.  ? ?Pulmonary Function Assessment: ? ? ?Exercise Target Goals: ?Exercise Program Goal: ?Individual exercise prescription set using results from initial 6 min walk test and THRR while considering  patient?s activity barriers and safety.  ? ?Exercise Prescription Goal: ?Initial exercise prescription builds to 30-45 minutes a day of aerobic activity, 2-3 days per week.  Home exercise guidelines will be given to patient during program as part of exercise prescription that the participant  will acknowledge. ? ?Education: Aerobic Exercise: ?- Group verbal and visual presentation on the components of exercise prescription. Introduces F.I.T.T principle from ACSM for exercise prescriptions.  Reviews F.I.T.T. principles of aerobic exercise including progression. Written material given at graduation. ?Flowsheet Row Pulmonary Rehab from 02/27/2022 in [facility] Cardiac and Pulmonary Rehab  ?  Date 12/19/21  ?Educator [facility]  ?Instruction Review Code 1- Verbalizes Understanding  ? ?  ? ? ?Education: Resistance Exercise: ?- Group verbal and visual presentation on the components of exercise prescription. Introduces F.I.T.T principle from ACSM for exercise prescriptions  Reviews F.I.T.T. principles of resistance exercise including progression. Written material given at graduation. ?Flowsheet Row Pulmonary Rehab from 02/27/2022 in [facility] Cardiac and Pulmonary Rehab  ?Date 12/26/21  ?Educator [facility]  ?Instruction Review Code 1- Verbalizes Understanding  ? ?  ? ?  ?Education: Exercise & Equipment Safety: ?- Individual verbal instruction and demonstration of equipment use and safety with use of the equipment. ?Flowsheet Row Pulmonary Rehab from 10/23/2021 in [facility] Cardiac and Pulmonary Rehab  ?Date 10/23/21  ?Educator [facility]  ?Instruction Review Code 1- Verbalizes Understanding  ? ?  ? ? ?Education: Exercise Physiology & General Exercise Guidelines: ?- Group verbal and written instruction with models to review the exercise physiology of the cardiovascular system and associated critical values. Provides general exercise guidelines with specific guidelines to those with heart or lung disease.  ?Flowsheet Row Pulmonary Rehab from 12/04/2021 in [facility] Cardiac and Pulmonary Rehab  ?Education need identified 12/04/21  ? ?  ? ? ?Education: Flexibility, Balance, Mind/Body Relaxation: ?- Group verbal and visual presentation with interactive activity on the components of exercise prescription. Introduces F.I.T.T principle from ACSM for exercise  prescriptions. Reviews F.I.T.T. principles of flexibility and balance exercise training including progression. Also discusses the mind body connection.  Reviews various relaxation techniques to help reduce and manage st

## 2022-05-08 ENCOUNTER — Ambulatory Visit: Payer: 59

## 2022-05-12 ENCOUNTER — Ambulatory Visit: Payer: 59

## 2022-05-12 ENCOUNTER — Telehealth: Payer: Self-pay | Admitting: *Deleted

## 2022-05-12 NOTE — Telephone Encounter (Signed)
Pt left message that he would be out again today.  Called back and left message.  He has been out since 04/09/22.  We will need to discuss discharge.  Requested call back.

## 2022-05-13 ENCOUNTER — Other Ambulatory Visit: Payer: 59

## 2022-05-13 ENCOUNTER — Ambulatory Visit: Payer: 59

## 2022-05-13 ENCOUNTER — Other Ambulatory Visit: Payer: Self-pay | Admitting: Family Medicine

## 2022-05-13 ENCOUNTER — Encounter (INDEPENDENT_AMBULATORY_CARE_PROVIDER_SITE_OTHER): Payer: Self-pay | Admitting: Nurse Practitioner

## 2022-05-13 ENCOUNTER — Ambulatory Visit (INDEPENDENT_AMBULATORY_CARE_PROVIDER_SITE_OTHER): Payer: 59 | Admitting: Nurse Practitioner

## 2022-05-13 VITALS — BP 161/83 | HR 87 | Resp 16 | Wt 251.4 lb

## 2022-05-13 DIAGNOSIS — I8312 Varicose 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: Secondary | ICD-10-CM

## 2022-05-13 DIAGNOSIS — A64 Unspecified sexually transmitted disease: Secondary | ICD-10-CM

## 2022-05-13 DIAGNOSIS — J45909 Unspecified asthma, uncomplicated: Secondary | ICD-10-CM

## 2022-05-13 NOTE — Patient Instructions (Signed)
Discharge Patient Instructions  Patient Details  Name: Jonathon Snow MRN: [id number] Date of Birth: Jan 16, 1961 Referring Provider:  No ref. provider found   Number of Visits: 26  Reason for Discharge:  Early Exit:  Medical  Smoking History:  Social History   Tobacco Use  Smoking Status Never  Smokeless Tobacco Never    Diagnosis:  Asthma, unspecified asthma severity, unspecified whether complicated, unspecified whether persistent  Initial Exercise Prescription:  Initial Exercise Prescription - 12/04/21 1200       Date of Initial Exercise RX and Referring Provider   Date 12/04/21    Referring Provider Jonathon Glazier MD      Oxygen   Maintain Oxygen Saturation 88% or higher      Treadmill   MPH 1.8    Grade 0    Minutes 15    METs 2.38      Recumbant Bike   Level 1    RPM 60    Watts 30    Minutes 15    METs 3.3      NuStep   Level 1    SPM 80    Minutes 15    METs 3.3      Prescription Details   Frequency (times per week) 2    Duration Progress to 30 minutes of continuous aerobic without signs/symptoms of physical distress      Intensity   THRR 40-80% of Max Heartrate 120-147    Ratings of Perceived Exertion 11-13    Perceived Dyspnea 0-4      Progression   Progression Continue to progress workloads to maintain intensity without signs/symptoms of physical distress.      Resistance Training   Training Prescription Yes    Weight 3 lb    Reps 10-15             Discharge Exercise Prescription (Final Exercise Prescription Changes):  Exercise Prescription Changes - 04/17/22 1500       Response to Exercise   Blood Pressure (Admit) 134/60    Blood Pressure (Exit) 132/60    Heart Rate (Admit) 80 bpm    Heart Rate (Exercise) 119 bpm    Heart Rate (Exit) 90 bpm    Oxygen Saturation (Admit) 95 %    Oxygen Saturation (Exercise) 92 %    Oxygen Saturation (Exit) 97 %    Rating of Perceived Exertion (Exercise) 13    Perceived Dyspnea  (Exercise) 3    Symptoms SOB, back pain    Duration Continue with 30 min of aerobic exercise without signs/symptoms of physical distress.    Intensity THRR unchanged      Progression   Progression Continue to progress workloads to maintain intensity without signs/symptoms of physical distress.    Average METs 3.78      Resistance Training   Training Prescription Yes    Weight 4 lb    Reps 10-15      Interval Training   Interval Training No      Treadmill   MPH 3    Grade 0    Minutes 15    METs 3.3      Recumbant Bike   Level 4    Minutes 15    METs 4.9      NuStep   Level 4    Minutes 15    METs 3.6      Recumbant Elliptical   Level 3    Minutes 15    METs 3.3  Home Exercise Plan   Plans to continue exercise at Home (comment)   walking, staff videos   Frequency Add 2 additional days to program exercise sessions.    Initial Home Exercises Provided 12/30/21      Oxygen   Maintain Oxygen Saturation 88% or higher             Functional Capacity:  6 Minute Walk     Row Name 12/04/21 1246         6 Minute Walk   Phase Initial     Distance 1060 feet     Walk Time 6 minutes     # of Rest Breaks 0     MPH 2     METS 3.37     RPE 11     Perceived Dyspnea  3     VO2 Peak 11.82     Symptoms Yes (comment)     Comments SOB, fatigue     Resting HR 93 bpm     Resting BP 124/72     Resting Oxygen Saturation  98 %     Exercise Oxygen Saturation  during 6 min walk 95 %     Max Ex. HR 118 bpm     Max Ex. BP 154/80     2 Minute Post BP 128/76       Interval HR   1 Minute HR 106     2 Minute HR 108     3 Minute HR 113     4 Minute HR 118     5 Minute HR 114     6 Minute HR 115     2 Minute Post HR 94     Interval Heart Rate? Yes       Interval Oxygen   Interval Oxygen? Yes     Baseline Oxygen Saturation % 98 %     1 Minute Oxygen Saturation % 96 %     1 Minute Liters of Oxygen 0 L  RA     2 Minute Oxygen Saturation % 95 %     2 Minute  Liters of Oxygen 0 L     3 Minute Oxygen Saturation % 95 %     3 Minute Liters of Oxygen 0 L     4 Minute Oxygen Saturation % 97 %     4 Minute Liters of Oxygen 0 L     5 Minute Oxygen Saturation % 96 %     5 Minute Liters of Oxygen 0 L     6 Minute Oxygen Saturation % 97 %     6 Minute Liters of Oxygen 0 L     2 Minute Post Oxygen Saturation % 97 %     2 Minute Post Liters of Oxygen 0 L               Nutrition & Weight - Outcomes:  Pre Biometrics - 12/04/21 1245       Pre Biometrics   Height 6' 2.5" (1.892 m)    Weight 238 lb 3.2 oz (108 kg)    BMI (Calculated) 30.18    Single Leg Stand --   declined             Nutrition:  Nutrition Therapy & Goals - 12/31/21 0843       Nutrition Therapy   Diet Heart healthy, low Na, pulmonary MNT    Drug/Food Interactions Statins/Certain Fruits    Protein (specify units) 100-110g  Fiber 30 grams    Whole Grain Foods 3 servings    Saturated Fats 16 max. grams    Fruits and Vegetables 8 servings/day    Sodium 2 grams      Personal Nutrition Goals   Nutrition Goal ST: prepping his chicken beforehand or cutting vegetables as this is time consuming and can be more versatile, prepping and freezing his breakfast  LT: prepare meals more efficiently to reduce fatigue, limit Na <2g/day, limit saturated fat <16g/day    Comments 61 y.o. M admitted to pulmonary rehab for asthma also presenting with COPD, HTN, stg 3 CKD, osteoporosis, lupus. Relevant medications include vit D, iron, MVI, prednisone, crestor, zoloft, cellcept. PYP 44.  Vegetables & Fruits 6/12. Breads, Grains & Cereals 6/12. Red & Processed Meat 4/12. Poultry 2/2. Fish & Shellfish 1/4. Beans, Nuts & Seeds 3/4. Milk & Dairy Foods 3/6. Toppings, Oils, Seasonings & Salt 6/20. Sweets, Snacks & Restaurant Food 5/14. Beverages 8/10. B: sometimes: bagel sandwich or cream cheese or breakfast sandwich. L: vaires: tries for a bigger meal at lunch. Soup and sandwich or just soup or  sandwich D: Protein and vegetables (mostly starchy vegetables). Drinks: "too much soda" - diet Snacks: m&ms, chips, crackers with peanut butter. Jonathon Snow reports his diet is not the best and that he should make changes like not having sausage so often. Jonathon Snow feels that chocolate and soda are his weaknessess. He feels his major barrier is his energy. His good days he is a 6 or 7,, but it is rare (maybe once every couple of weeks). Normally he is at a 1-2 for energy. . Discussed heart healthy eating and pulmonary MNT. Discussed ingredient prepping - he has tried meal prepping and freezer meals before but it just went to waste. Suggested starting slowly with prepping his chicken beforehand or cutting vegetables as this is time consuming and can be more versatile to prevent food waste. Also suggested prepping his breakfast and freezing it week to week as he eats close to the same thing daily.      Intervention Plan   Intervention Prescribe, educate and counsel regarding individualized specific dietary modifications aiming towards targeted core components such as weight, hypertension, lipid management, diabetes, heart failure and other comorbidities.    Expected Outcomes Short Term Goal: Understand basic principles of dietary content, such as calories, fat, sodium, cholesterol and nutrients.;Short Term Goal: A plan has been developed with personal nutrition goals set during dietitian appointment.;Long Term Goal: Adherence to prescribed nutrition plan.             Goals reviewed with patient; copy given to patient.

## 2022-05-13 NOTE — Progress Notes (Signed)
Discharge Progress Report  Patient Details  Name: Jonathon Snow MRN: [id number] Date of Birth: 1961/08/21 Referring Provider:   Flowsheet Row Pulmonary Rehab from 12/04/2021 in [facility]  Referring Provider [name] MD        Number of Visits: 26  Reason for Discharge:  Early Exit:  Medical   Smoking History:  Social History   Tobacco Use  Smoking Status Never  Smokeless Tobacco Never    Diagnosis:  Asthma, unspecified asthma severity, unspecified whether complicated, unspecified whether persistent  ADL UCSD:  Pulmonary Assessment Scores     Row Name 12/04/21 1237         ADL UCSD   ADL Phase Entry     SOB Score total 79     Rest 1     Walk 5     Stairs 5     Bath 2     Dress 2     Shop 3       CAT Score   CAT Score 20       mMRC Score   mMRC Score 3              Initial Exercise Prescription:  Initial Exercise Prescription - 12/04/21 1200       Date of Initial Exercise RX and Referring Provider   Date 12/04/21    Referring Provider [name] MD      Oxygen   Maintain Oxygen Saturation 88% or higher      Treadmill   MPH 1.8    Grade 0    Minutes 15    METs 2.38      Recumbant Bike   Level 1    RPM 60    Watts 30    Minutes 15    METs 3.3      NuStep   Level 1    SPM 80    Minutes 15    METs 3.3      Prescription Details   Frequency (times per week) 2    Duration Progress to 30 minutes of continuous aerobic without signs/symptoms of physical distress      Intensity   THRR 40-80% of Max Heartrate 120-147    Ratings of Perceived Exertion 11-13    Perceived Dyspnea 0-4      Progression   Progression Continue to progress workloads to maintain intensity without signs/symptoms of physical distress.      Resistance Training   Training Prescription Yes    Weight 3 lb    Reps 10-15             Discharge Exercise Prescription (Final Exercise Prescription Changes):  Exercise  Prescription Changes - 04/17/22 1500       Response to Exercise   Blood Pressure (Admit) 134/60    Blood Pressure (Exit) 132/60    Heart Rate (Admit) 80 bpm    Heart Rate (Exercise) 119 bpm    Heart Rate (Exit) 90 bpm    Oxygen Saturation (Admit) 95 %    Oxygen Saturation (Exercise) 92 %    Oxygen Saturation (Exit) 97 %    Rating of Perceived Exertion (Exercise) 13    Perceived Dyspnea (Exercise) 3    Symptoms SOB, back pain    Duration Continue with 30 min of aerobic exercise without signs/symptoms of physical distress.    Intensity THRR unchanged      Progression   Progression Continue to progress workloads to maintain intensity without signs/symptoms of  physical distress.    Average METs 3.78      Resistance Training   Training Prescription Yes    Weight 4 lb    Reps 10-15      Interval Training   Interval Training No      Treadmill   MPH 3    Grade 0    Minutes 15    METs 3.3      Recumbant Bike   Level 4    Minutes 15    METs 4.9      NuStep   Level 4    Minutes 15    METs 3.6      Recumbant Elliptical   Level 3    Minutes 15    METs 3.3      Home Exercise Plan   Plans to continue exercise at Home (comment)   walking, staff videos   Frequency Add 2 additional days to program exercise sessions.    Initial Home Exercises Provided 12/30/21      Oxygen   Maintain Oxygen Saturation 88% or higher             Functional Capacity:  6 Minute Walk     Row Name 12/04/21 1246         6 Minute Walk   Phase Initial     Distance 1060 feet     Walk Time 6 minutes     # of Rest Breaks 0     MPH 2     METS 3.37     RPE 11     Perceived Dyspnea  3     VO2 Peak 11.82     Symptoms Yes (comment)     Comments SOB, fatigue     Resting HR 93 bpm     Resting BP 124/72     Resting Oxygen Saturation  98 %     Exercise Oxygen Saturation  during 6 min walk 95 %     Max Ex. HR 118 bpm     Max Ex. BP 154/80     2 Minute Post BP 128/76       Interval HR    1 Minute HR 106     2 Minute HR 108     3 Minute HR 113     4 Minute HR 118     5 Minute HR 114     6 Minute HR 115     2 Minute Post HR 94     Interval Heart Rate? Yes       Interval Oxygen   Interval Oxygen? Yes     Baseline Oxygen Saturation % 98 %     1 Minute Oxygen Saturation % 96 %     1 Minute Liters of Oxygen 0 L  RA     2 Minute Oxygen Saturation % 95 %     2 Minute Liters of Oxygen 0 L     3 Minute Oxygen Saturation % 95 %     3 Minute Liters of Oxygen 0 L     4 Minute Oxygen Saturation % 97 %     4 Minute Liters of Oxygen 0 L     5 Minute Oxygen Saturation % 96 %     5 Minute Liters of Oxygen 0 L     6 Minute Oxygen Saturation % 97 %     6 Minute Liters of Oxygen 0 L     2 Minute Post Oxygen Saturation %  97 %     2 Minute Post Liters of Oxygen 0 L              Psychological, QOL, Others - Outcomes: PHQ 2/9:    04/09/2022    8:34 AM 03/04/2022   10:16 AM 02/25/2022    8:14 AM 02/03/2022    8:24 AM 12/30/2021    8:46 AM  Depression screen PHQ 2/9  Decreased Interest 0 '1 1 1 '$ 0  Down, Depressed, Hopeless 1 0 1 1 0  PHQ - 2 Score '1 1 2 2 '$ 0  Altered sleeping '3 1 2 2 1  '$ Tired, decreased energy '3 1 3 3 3  '$ Change in appetite 3 0 1 3 0  Feeling bad or failure about yourself  0 0 0 0 0  Trouble concentrating 0 '1 2 1 1  '$ Moving slowly or fidgety/restless 0 0 1 1 0  Suicidal thoughts 0 0 0 0 0  PHQ-9 Score '10 4 11 12 5  '$ Difficult doing work/chores Extremely dIfficult Somewhat difficult  Very difficult Somewhat difficult    Quality of Life:    Nutrition & Weight - Outcomes:  Pre Biometrics - 12/04/21 1245       Pre Biometrics   Height 6' 2.5" (1.892 m)    Weight 238 lb 3.2 oz (108 kg)    BMI (Calculated) 30.18    Single Leg Stand --   declined             Nutrition:  Nutrition Therapy & Goals - 12/31/21 0843       Nutrition Therapy   Diet Heart healthy, low Na, pulmonary MNT    Drug/Food Interactions Statins/Certain Fruits    Protein  (specify units) 100-110g    Fiber 30 grams    Whole Grain Foods 3 servings    Saturated Fats 16 max. grams    Fruits and Vegetables 8 servings/day    Sodium 2 grams      Personal Nutrition Goals   Nutrition Goal ST: prepping his chicken beforehand or cutting vegetables as this is time consuming and can be more versatile, prepping and freezing his breakfast  LT: prepare meals more efficiently to reduce fatigue, limit Na <2g/day, limit saturated fat <16g/day    Comments 61 y.o. M admitted to pulmonary rehab for asthma also presenting with COPD, HTN, stg 3 CKD, osteoporosis, lupus. Relevant medications include vit D, iron, MVI, prednisone, crestor, zoloft, cellcept. PYP 44.  Vegetables & Fruits 6/12. Breads, Grains & Cereals 6/12. Red & Processed Meat 4/12. Poultry 2/2. Fish & Shellfish 1/4. Beans, Nuts & Seeds 3/4. Milk & Dairy Foods 3/6. Toppings, Oils, Seasonings & Salt 6/20. Sweets, Snacks & Restaurant Food 5/14. Beverages 8/10. B: sometimes: bagel sandwich or cream cheese or breakfast sandwich. L: vaires: tries for a bigger meal at lunch. Soup and sandwich or just soup or sandwich D: Protein and vegetables (mostly starchy vegetables). Drinks: "too much soda" - diet Snacks: m&ms, chips, crackers with peanut butter. Tavish reports his diet is not the best and that he should make changes like not having sausage so often. Carvel feels that chocolate and soda are his weaknessess. He feels his major barrier is his energy. His good days he is a 6 or 7,, but it is rare (maybe once every couple of weeks). Normally he is at a 1-2 for energy. . Discussed heart healthy eating and pulmonary MNT. Discussed ingredient prepping - he has tried meal prepping and freezer meals before but it  just went to waste. Suggested starting slowly with prepping his chicken beforehand or cutting vegetables as this is time consuming and can be more versatile to prevent food waste. Also suggested prepping his breakfast and freezing it week  to week as he eats close to the same thing daily.      Intervention Plan   Intervention Prescribe, educate and counsel regarding individualized specific dietary modifications aiming towards targeted core components such as weight, hypertension, lipid management, diabetes, heart failure and other comorbidities.    Expected Outcomes Short Term Goal: Understand basic principles of dietary content, such as calories, fat, sodium, cholesterol and nutrients.;Short Term Goal: A plan has been developed with personal nutrition goals set during dietitian appointment.;Long Term Goal: Adherence to prescribed nutrition plan.             Nutrition Discharge:   Education Questionnaire Score:  Knowledge Questionnaire Score - 12/04/21 1237       Knowledge Questionnaire Score   Pre Score 14/18: Lung disease, Oxygen, Exercise             Goals reviewed with patient; copy given to patient.

## 2022-05-13 NOTE — Progress Notes (Signed)
Pulmonary Individual Treatment Plan  Patient Details  Name: Jonathon Snow MRN: [id number] Date of Birth: [birth year] Referring Provider:   Flowsheet Row Pulmonary Rehab from 12/04/2021 in [facility] and Pulmonary Rehab  Referring Provider [name] MD       Initial Encounter Date:  Flowsheet Row Pulmonary Rehab from 12/04/2021 in [facility] Cardiac and Pulmonary Rehab  Date 12/04/21       Visit Diagnosis: Asthma, unspecified asthma severity, unspecified whether complicated, unspecified whether persistent  Patient's Home Medications on Admission:  Current Outpatient Medications:    albuterol (VENTOLIN HFA) 108 (90 Base) MCG/ACT inhaler, Inhale 2 puffs into the lungs every 6 (six) hours as needed for wheezing or shortness of breath., Disp: 18 g, Rfl: 2   amLODipine (NORVASC) 5 MG tablet, Take 1 tablet (5 mg total) by mouth daily., Disp: 90 tablet, Rfl: 3   apixaban (ELIQUIS) 5 MG TABS tablet, Take 1 tablet (5 mg total) by mouth 2 (two) times daily., Disp: 60 tablet, Rfl: 6   cholecalciferol (VITAMIN D3) 25 MCG (1000 UNIT) tablet, Take 1,000 Units by mouth daily., Disp: , Rfl:    doxycycline (VIBRAMYCIN) 100 MG capsule, Take 1 capsule (100 mg total) by mouth every 12 (twelve) hours. (Patient not taking: Reported on 05/13/2022), Disp: 14 capsule, Rfl: 0   losartan (COZAAR) 100 MG tablet, Take 1 tablet (100 mg total) by mouth daily., Disp: 90 tablet, Rfl: 1   montelukast (SINGULAIR) 10 MG tablet, Take 1 tablet (10 mg total) by mouth at bedtime., Disp: 90 tablet, Rfl: 3   Multiple Vitamins-Minerals (MULTIVITAMIN WITH MINERALS) tablet, Take 1 tablet by mouth daily., Disp: , Rfl:    mycophenolate (CELLCEPT) 500 MG tablet, Take 1,000 mg by mouth 2 (two) times daily., Disp: , Rfl:    predniSONE (DELTASONE) 10 MG tablet, Take 10 mg by mouth daily with breakfast., Disp: , Rfl:    rosuvastatin (CRESTOR) 20 MG tablet, Take one pill every other night before bed., Disp: 45 tablet, Rfl: 1    sertraline (ZOLOFT) 25 MG tablet, Take 1 tablet (25 mg total) by mouth daily., Disp: 90 tablet, Rfl: 1   sildenafil (REVATIO) 20 MG tablet, 3-5 tablets as needed 30 min prior to intercourse, Disp: 30 tablet, Rfl: 11   traMADol (ULTRAM) 50 MG tablet, Take 1 tablet (50 mg total) by mouth every 6 (six) hours as needed., Disp: 20 tablet, Rfl: 0  Current Facility-Administered Medications:    cefTRIAXone (ROCEPHIN) injection 1 g, 1 g, Intramuscular, Once, [place]owan, Hunt Oris, PA-C  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   Overview:  superior sagittal sinus, left transverse sinus and cortical veins    COVID-19 virus infection 12/2020   Depression    DVT (deep venous thrombosis) (HCC)    Dyspnea    GERD (gastroesophageal reflux disease)    Heel spur, left 02/16/2019   Heel spur, right 02/16/2019   Herpes zoster infection of lumbar region 02/20/2020   Hyperlipidemia    Hypertension    Lupus ([place])    Lymphedema 10/07/2018   Morbid obesity ([place])    Opiate abuse, episodic ([place]) 02/26/2018   Osteoporosis    Pneumonia    PONV (postoperative nausea and vomiting)    Postphlebitic syndrome with ulcer, 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   Presence of IVC filter 03/22/2020   Removed   Pulmonary embolism ([place])    Renal disorder    Stage III  Tobacco Use: Social History   Tobacco Use  Smoking Status Never  Smokeless Tobacco Never    Labs: Review Flowsheet        Latest Ref Rng & Units 02/15/2019 01/17/2020 11/07/2020 03/19/2021  Labs for ITP Cardiac and Pulmonary Rehab  Cholestrol <200 mg/dL 244   227   275     LDL (calc) mg/dL (calc) 169   159   196     HDL-C > OR = 40 mg/dL 50   46   55     Trlycerides <150 mg/dL 119   108   107     Hemoglobin A1c <5.7 % of total Hgb   5.8   5.8       09/18/2021  Labs for ITP Cardiac and Pulmonary Rehab  Cholestrol 139    LDL (calc) 76    HDL-C 45     Trlycerides 92    Hemoglobin A1c 5.7             Pulmonary Assessment Scores:  Pulmonary Assessment Scores     Row Name 12/04/21 1237         ADL UCSD   ADL Phase Entry     SOB Score total 79     Rest 1     Walk 5     Stairs 5     Bath 2     Dress 2     Shop 3       CAT Score   CAT Score 20       mMRC Score   mMRC Score 3              UCSD: Self-administered rating of dyspnea associated with activities of daily living (ADLs) 6-point scale (0 = "not at all" to 5 = "maximal or unable to do because of breathlessness")  Scoring Scores range from 0 to 120.  Minimally important difference is 5 units  CAT: CAT can identify the health impairment of COPD patients and is better correlated with disease progression.  CAT has a scoring range of zero to 40. The CAT score is classified into four groups of low (less than 10), medium (10 - 20), high (21-30) and very high (31-40) based on the impact level of disease on health status. A CAT score over 10 suggests significant symptoms.  A worsening CAT score could be explained by an exacerbation, poor medication adherence, poor inhaler technique, or progression of COPD or comorbid conditions.  CAT MCID is 2 points  mMRC: mMRC (Modified Medical Research Council) Dyspnea Scale is used to assess the degree of baseline functional disability in patients of respiratory disease due to dyspnea. No minimal important difference is established. A decrease in score of 1 point or greater is considered a positive change.   Pulmonary Function Assessment:   Exercise Target Goals: Exercise Program Goal: Individual exercise prescription set using results from initial 6 min walk test and THRR while considering  patient's activity barriers and safety.   Exercise Prescription Goal: Initial exercise prescription builds to 30-45 minutes a day of aerobic activity, 2-3 days per week.  Home exercise guidelines will be given to patient during program as  part of exercise prescription that the participant will acknowledge.  Education: Aerobic Exercise: - Group verbal and visual presentation on the components of exercise prescription. Introduces F.I.T.T principle from ACSM for exercise prescriptions.  Reviews F.I.T.T. principles of aerobic exercise including progression. Written material given at graduation. Flowsheet Row Pulmonary Rehab from 02/27/2022 in  [place] [facility] and Pulmonary Rehab  Date 12/19/21  Educator [facility]  Instruction Review Code 1- Verbalizes Understanding       Education: Resistance Exercise: - Group verbal and visual presentation on the components of exercise prescription. Introduces F.I.T.T principle from ACSM for exercise prescriptions  Reviews F.I.T.T. principles of resistance exercise including progression. Written material given at graduation. Flowsheet Row Pulmonary Rehab from 02/27/2022 in [facility] Cardiac and Pulmonary Rehab  Date 12/26/21  Educator [facility]  Instruction Review Code 1- Verbalizes Understanding        Education: Exercise & Equipment Safety: - Individual verbal instruction and demonstration of equipment use and safety with use of the equipment. Flowsheet Row Pulmonary Rehab from 10/23/2021 in [facility] Cardiac and Pulmonary Rehab  Date 10/23/21  Educator [facility]  Instruction Review Code 1- Verbalizes Understanding       Education: Exercise Physiology & General Exercise Guidelines: - Group verbal and written instruction with models to review the exercise physiology of the cardiovascular system and associated critical values. Provides general exercise guidelines with specific guidelines to those with heart or lung disease.  Flowsheet Row Pulmonary Rehab from 12/04/2021 in [facility] Cardiac and Pulmonary Rehab  Education need identified 12/04/21       Education: Flexibility, Balance, Mind/Body Relaxation: - Group verbal and visual presentation with interactive activity on the components of exercise prescription. Introduces  F.I.T.T principle from ACSM for exercise prescriptions. Reviews F.I.T.T. principles of flexibility and balance exercise training including progression. Also discusses the mind body connection.  Reviews various relaxation techniques to help reduce and manage stress (i.e. Deep breathing, progressive muscle relaxation, and visualization). Balance handout provided to take home. Written material given at graduation.   Activity Barriers & Risk Stratification:  Activity Barriers & Cardiac Risk Stratification - 12/04/21 1245       Activity Barriers & Cardiac Risk Stratification   Activity Barriers Joint Problems;Deconditioning;Muscular Weakness;Back Problems;Shortness of Breath;Other (comment)    Comments Lupus             6 Minute Walk:  6 Minute Walk     Row Name 12/04/21 1246         6 Minute Walk   Phase Initial     Distance 1060 feet     Walk Time 6 minutes     # of Rest Breaks 0     MPH 2     METS 3.37     RPE 11     Perceived Dyspnea  3     VO2 Peak 11.82     Symptoms Yes (comment)     Comments SOB, fatigue     Resting HR 93 bpm     Resting BP 124/72     Resting Oxygen Saturation  98 %     Exercise Oxygen Saturation  during 6 min walk 95 %     Max Ex. HR 118 bpm     Max Ex. BP 154/80     2 Minute Post BP 128/76       Interval HR   1 Minute HR 106     2 Minute HR 108     3 Minute HR 113     4 Minute HR 118     5 Minute HR 114     6 Minute HR 115     2 Minute Post HR 94     Interval Heart Rate? Yes       Interval Oxygen   Interval Oxygen? Yes     Baseline Oxygen  Saturation % 98 %     1 Minute Oxygen Saturation % 96 %     1 Minute Liters of Oxygen 0 L  RA     2 Minute Oxygen Saturation % 95 %     2 Minute Liters of Oxygen 0 L     3 Minute Oxygen Saturation % 95 %     3 Minute Liters of Oxygen 0 L     4 Minute Oxygen Saturation % 97 %     4 Minute Liters of Oxygen 0 L     5 Minute Oxygen Saturation % 96 %     5 Minute Liters of Oxygen 0 L     6 Minute  Oxygen Saturation % 97 %     6 Minute Liters of Oxygen 0 L     2 Minute Post Oxygen Saturation % 97 %     2 Minute Post Liters of Oxygen 0 L             Oxygen Initial Assessment:  Oxygen Initial Assessment - 12/04/21 1237       Home Oxygen   Home Oxygen Device None    Sleep Oxygen Prescription None    Home Exercise Oxygen Prescription None    Home Resting Oxygen Prescription None    Compliance with Home Oxygen Use Yes      Initial 6 min Walk   Oxygen Used None      Program Oxygen Prescription   Program Oxygen Prescription None      Intervention   Short Term Goals To learn and exhibit compliance with exercise, home and travel O2 prescription;To learn and understand importance of monitoring SPO2 with pulse oximeter and demonstrate accurate use of the pulse oximeter.;To learn and understand importance of maintaining oxygen saturations>88%;To learn and demonstrate proper pursed lip breathing techniques or other breathing techniques. ;To learn and demonstrate proper use of respiratory medications    Long  Term Goals Exhibits compliance with exercise, home  and travel O2 prescription;Verbalizes importance of monitoring SPO2 with pulse oximeter and return demonstration;Maintenance of O2 saturations>88%;Exhibits proper breathing techniques, such as pursed lip breathing or other method taught during program session;Compliance with respiratory medication;Demonstrates proper use of MDI's             Oxygen Re-Evaluation:  Oxygen Re-Evaluation     Row Name 12/17/21 0821 12/30/21 0911 01/21/22 0805 03/27/22 0808       Program Oxygen Prescription   Program Oxygen Prescription None None None None      Home Oxygen   Home Oxygen Device None None None None    Sleep Oxygen Prescription None None None None    Home Exercise Oxygen Prescription None None None None    Home Resting Oxygen Prescription None None None None    Compliance with Home Oxygen Use Yes -- Yes Yes       Goals/Expected Outcomes   Short Term Goals To learn and demonstrate proper pursed lip breathing techniques or other breathing techniques. ;To learn and demonstrate proper use of respiratory medications To learn and demonstrate proper pursed lip breathing techniques or other breathing techniques. ;To learn and demonstrate proper use of respiratory medications;To learn and understand importance of maintaining oxygen saturations>88%;To learn and understand importance of monitoring SPO2 with pulse oximeter and demonstrate accurate use of the pulse oximeter. To learn and demonstrate proper pursed lip breathing techniques or other breathing techniques. ;To learn and demonstrate proper use of respiratory medications;To learn and understand importance of  maintaining oxygen saturations>88%;To learn and understand importance of monitoring SPO2 with pulse oximeter and demonstrate accurate use of the pulse oximeter. To learn and demonstrate proper pursed lip breathing techniques or other breathing techniques. ;To learn and demonstrate proper use of respiratory medications;To learn and understand importance of maintaining oxygen saturations>88%;To learn and understand importance of monitoring SPO2 with pulse oximeter and demonstrate accurate use of the pulse oximeter.    Long  Term Goals Exhibits proper breathing techniques, such as pursed lip breathing or other method taught during program session;Compliance with respiratory medication;Demonstrates proper use of MDI's Exhibits proper breathing techniques, such as pursed lip breathing or other method taught during program session;Compliance with respiratory medication;Demonstrates proper use of MDI's;Verbalizes importance of monitoring SPO2 with pulse oximeter and return demonstration;Maintenance of O2 saturations>88% Exhibits proper breathing techniques, such as pursed lip breathing or other method taught during program session;Compliance with respiratory  medication;Demonstrates proper use of MDI's;Verbalizes importance of monitoring SPO2 with pulse oximeter and return demonstration;Maintenance of O2 saturations>88% Exhibits proper breathing techniques, such as pursed lip breathing or other method taught during program session;Compliance with respiratory medication;Demonstrates proper use of MDI's;Verbalizes importance of monitoring SPO2 with pulse oximeter and return demonstration;Maintenance of O2 saturations>88%    Comments Reviewed PLB technique with pt.  Talked about how it works and it's importance in maintaining their exercise saturations.      Short: Become more profiecient at using PLB.   Long: Become independent at using PLB. Abran is doing well in rehab. He is using his PLB and finds it helpful, but still having some SOB.  He is doing well on his medicaitons.  His saturations are staying up.  He will continue to exercise to help with breathing overall. Edouard still practices PLB.  He does monitor O2 while exercising at home.  He reports using medications as directed. Amiir's breathing feels good and bad day by day. He is using PLB when needed. He is trying to get out and walk outside more and focus on his pattern of breathing. Ecnouraged to keep checking HR & O2    Goals/Expected Outcomes Short: Become more profiecient at using PLB.   Long: Become independent at using PLB. Short: Continue to exercise for breathing benefits Long: continue to use PLB to help manage SOB Short: continue to exercise to help brathing Long: be independent using PLB Short: Continue to check HR & O2 Long: Independent at PLB             Oxygen Discharge (Final Oxygen Re-Evaluation):  Oxygen Re-Evaluation - 03/27/22 0808       Program Oxygen Prescription   Program Oxygen Prescription None      Home Oxygen   Home Oxygen Device None    Sleep Oxygen Prescription None    Home Exercise Oxygen Prescription None    Home Resting Oxygen Prescription None    Compliance  with Home Oxygen Use Yes      Goals/Expected Outcomes   Short Term Goals To learn and demonstrate proper pursed lip breathing techniques or other breathing techniques. ;To learn and demonstrate proper use of respiratory medications;To learn and understand importance of maintaining oxygen saturations>88%;To learn and understand importance of monitoring SPO2 with pulse oximeter and demonstrate accurate use of the pulse oximeter.    Long  Term Goals Exhibits proper breathing techniques, such as pursed lip breathing or other method taught during program session;Compliance with respiratory medication;Demonstrates proper use of MDI's;Verbalizes importance of monitoring SPO2 with pulse oximeter and return demonstration;Maintenance of O2  saturations>88%    Comments Deverick's breathing feels good and bad day by day. He is using PLB when needed. He is trying to get out and walk outside more and focus on his pattern of breathing. Ecnouraged to keep checking HR & O2    Goals/Expected Outcomes Short: Continue to check HR & O2 Long: Independent at PLB             Initial Exercise Prescription:  Initial Exercise Prescription - 12/04/21 1200       Date of Initial Exercise RX and Referring Provider   Date 12/04/21    Referring Provider [name] MD      Oxygen   Maintain Oxygen Saturation 88% or higher      Treadmill   MPH 1.8    Grade 0    Minutes 15    METs 2.38      Recumbant Bike   Level 1    RPM 60    Watts 30    Minutes 15    METs 3.3      NuStep   Level 1    SPM 80    Minutes 15    METs 3.3      Prescription Details   Frequency (times per week) 2    Duration Progress to 30 minutes of continuous aerobic without signs/symptoms of physical distress      Intensity   THRR 40-80% of Max Heartrate 120-147    Ratings of Perceived Exertion 11-13    Perceived Dyspnea 0-4      Progression   Progression Continue to progress workloads to maintain intensity without signs/symptoms of  physical distress.      Resistance Training   Training Prescription Yes    Weight 3 lb    Reps 10-15             Perform Capillary Blood Glucose checks as needed.  Exercise Prescription Changes:   Exercise Prescription Changes     Row Name 12/04/21 1200 12/24/21 0900 01/08/22 1200 01/21/22 1600 02/05/22 1400     Response to Exercise   Blood Pressure (Admit) 124/72 148/74 126/64 130/82 146/72   Blood Pressure (Exercise) 154/80 178/64 122/66 200/80 --   Blood Pressure (Exit) 128/76 106/54 122/70 150/78 132/82   Heart Rate (Admit) 93 bpm 104 bpm 110 bpm 81 bpm 85 bpm   Heart Rate (Exercise) 118 bpm 130 bpm 132 bpm 118 bpm 110 bpm   Heart Rate (Exit) 94 bpm 112 bpm 106 bpm 103 bpm 102 bpm   Oxygen Saturation (Admit) 98 % 95 % 97 % 98 % 96 %   Oxygen Saturation (Exercise) 95 % 95 % 99 % 96 % 98 %   Oxygen Saturation (Exit) 97 % 95 % 97 % 96 % 97 %   Rating of Perceived Exertion (Exercise) _0 Perceived Dyspnea (Exercise) _1 --   Symptoms SOB, fatigue SOB SOB SOB --   Comments walk test results 2nd full session -- -- --   Duration -- Progress to 30 minutes of  aerobic without signs/symptoms of physical distress Progress to 30 minutes of  aerobic without signs/symptoms of physical distress Continue with 30 min of aerobic exercise without signs/symptoms of physical distress. Continue with 30 min of aerobic exercise without signs/symptoms of physical distress.   Intensity -- THRR unchanged THRR unchanged THRR unchanged THRR unchanged     Progression   Progression -- Continue to progress workloads to maintain intensity  without signs/symptoms of physical distress. Continue to progress workloads to maintain intensity without signs/symptoms of physical distress. Continue to progress workloads to maintain intensity without signs/symptoms of physical distress. Continue to progress workloads to maintain intensity without signs/symptoms of physical distress.   Average METs --  3.2 2.75 3.01 4.4     Resistance Training   Training Prescription -- Yes Yes Yes Yes   Weight -- 3 lb 3 lb 3 lb 3 lb   Reps -- 10-15 10-15 10-15 10-15     Interval Training   Interval Training -- No No No No     Treadmill   MPH -- 1._0 --   Grade -- 0 0 0 --   Minutes -- _1 --   METs -- 2.38 2.53 2.53 --     Recumbant Bike   Level -- 3 -- 3 3   Minutes -- 15 -- 15 15   METs -- 2.73 -- -- 5.2     NuStep   Level -- _2 Minutes -- _3 METs -- 4.4 3 38 3.6     REL-XR   Level -- -- -- 3 --   Minutes -- -- -- 15 --   METs -- -- -- 3.2 --     Home Exercise Plan   Plans to continue exercise at -- -- -- Home (comment)  walking, staff videos --   Frequency -- -- -- Add 2 additional days to program exercise sessions. --   Initial Home Exercises Provided -- -- -- 12/30/21 --     Oxygen   Maintain Oxygen Saturation -- 88% or higher 88% or higher 88% or higher --    Row Name 03/04/22 0700 03/17/22 1500 04/01/22 1300 04/17/22 1500       Response to Exercise   Blood Pressure (Admit) 108/62 152/82 122/72 134/60    Blood Pressure (Exit) 126/64 142/84 124/64 132/60    Heart Rate (Admit) 97 bpm 92 bpm 86 bpm 80 bpm    Heart Rate (Exercise) 112 bpm 122 bpm 114 bpm 119 bpm    Heart Rate (Exit) 90 bpm 96 bpm 94 bpm 90 bpm    Oxygen Saturation (Admit) 96 % 98 % 97 % 95 %    Oxygen Saturation (Exercise) 98 % 96 % 96 % 92 %    Oxygen Saturation (Exit) 97 % 97 % 95 % 97 %    Rating of Perceived Exertion (Exercise) _4 Perceived Dyspnea (Exercise) -- _5 Symptoms -- SOB SOB SOB, back pain    Duration Continue with 30 min of aerobic exercise without signs/symptoms of physical distress. Continue with 30 min of aerobic exercise without signs/symptoms of physical distress. Continue with 30 min of aerobic exercise without signs/symptoms of physical distress. Continue with 30 min of aerobic exercise without signs/symptoms of physical distress.     Intensity THRR unchanged THRR unchanged THRR unchanged THRR unchanged      Progression   Progression Continue to progress workloads to maintain intensity without signs/symptoms of physical distress. Continue to progress workloads to maintain intensity without signs/symptoms of physical distress. Continue to progress workloads to maintain intensity without signs/symptoms of physical distress. Continue to progress workloads to maintain intensity without signs/symptoms of physical distress.    Average METs 3.65 4.75 4.25 3.78      Resistance Training   Training Prescription Yes Yes Yes Yes  Weight 4 lb 4 lb 4 lb 4 lb    Reps 10-15 10-15 10-15 10-15      Interval Training   Interval Training No No No No      Treadmill   MPH -- -- -- 3    Grade -- -- -- 0    Minutes -- -- -- 15    METs -- -- -- 3.3      Recumbant Bike   Level _0 Minutes _1 METs 3.8 4.6 4.9 4.9      NuStep   Level _2 Minutes _3 METs 3.5 4.9 3.6 3.6      Recumbant Elliptical   Level -- -- -- 3    Minutes -- -- -- 15    METs -- -- -- 3.3      Home Exercise Plan   Plans to continue exercise at Home (comment)  walking, staff videos Home (comment)  walking, staff videos Home (comment)  walking, staff videos Home (comment)  walking, staff videos    Frequency Add 2 additional days to program exercise sessions. Add 2 additional days to program exercise sessions. Add 2 additional days to program exercise sessions. Add 2 additional days to program exercise sessions.    Initial Home Exercises Provided 12/30/21 12/30/21 12/30/21 12/30/21      Oxygen   Maintain Oxygen Saturation 88% or higher 88% or higher 88% or higher 88% or higher             Exercise Comments:   Exercise Comments     Row Name 12/17/21 0820 01/09/22 0756         Exercise Comments First full day of exercise!  Patient was oriented to gym and equipment including functions, settings, policies, and  procedures.  Patient's individual exercise prescription and treatment plan were reviewed.  All starting workloads were established based on the results of the 6 minute walk test done at initial orientation visit.  The plan for exercise progression was also introduced and progression will be customized based on patient's performance and goals. Pt able to follow exercise prescription today without complaint.  Pt has been out due to cellulitis in leg. He stated his leg is some better and is able to exercise today at a lower level. Will continue to monitor for progression.               Exercise Goals and Review:   Exercise Goals     Row Name 12/04/21 1255             Exercise Goals   Increase Physical Activity Yes       Intervention Provide advice, education, support and counseling about physical activity/exercise needs.;Develop an individualized exercise prescription for aerobic and resistive training based on initial evaluation findings, risk stratification, comorbidities and participant's personal goals.       Expected Outcomes Short Term: Attend rehab on a regular basis to increase amount of physical activity.;Long Term: Add in home exercise to make exercise part of routine and to increase amount of physical activity.;Long Term: Exercising regularly at least 3-5 days a week.       Increase Strength and Stamina Yes       Intervention Provide advice, education, support and counseling about physical activity/exercise needs.;Develop an individualized exercise prescription for aerobic and resistive training based on initial evaluation findings, risk stratification, comorbidities  and participant's personal goals.       Expected Outcomes Short Term: Increase workloads from initial exercise prescription for resistance, speed, and METs.;Short Term: Perform resistance training exercises routinely during rehab and add in resistance training at home;Long Term: Improve cardiorespiratory fitness, muscular  endurance and strength as measured by increased METs and functional capacity (6MWT)       Able to understand and use rate of perceived exertion (RPE) scale Yes       Intervention Provide education and explanation on how to use RPE scale       Expected Outcomes Short Term: Able to use RPE daily in rehab to express subjective intensity level;Long Term:  Able to use RPE to guide intensity level when exercising independently       Able to understand and use Dyspnea scale Yes       Intervention Provide education and explanation on how to use Dyspnea scale       Expected Outcomes Short Term: Able to use Dyspnea scale daily in rehab to express subjective sense of shortness of breath during exertion;Long Term: Able to use Dyspnea scale to guide intensity level when exercising independently       Knowledge and understanding of Target Heart Rate Range (THRR) Yes       Intervention Provide education and explanation of THRR including how the numbers were predicted and where they are located for reference       Expected Outcomes Short Term: Able to state/look up THRR;Long Term: Able to use THRR to govern intensity when exercising independently;Short Term: Able to use daily as guideline for intensity in rehab       Able to check pulse independently Yes       Intervention Provide education and demonstration on how to check pulse in carotid and radial arteries.;Review the importance of being able to check your own pulse for safety during independent exercise       Expected Outcomes Short Term: Able to explain why pulse checking is important during independent exercise;Long Term: Able to check pulse independently and accurately       Understanding of Exercise Prescription Yes       Intervention Provide education, explanation, and written materials on patient's individual exercise prescription       Expected Outcomes Short Term: Able to explain program exercise prescription;Long Term: Able to explain home exercise  prescription to exercise independently                Exercise Goals Re-Evaluation :  Exercise Goals Re-Evaluation     Row Name 12/17/21 2878 12/24/21 0913 12/30/21 0845 01/08/22 1236 01/21/22 0757     Exercise Goal Re-Evaluation   Exercise Goals Review Able to understand and use rate of perceived exertion (RPE) scale;Able to understand and use Dyspnea scale;Knowledge and understanding of Target Heart Rate Range (THRR);Understanding of Exercise Prescription Increase Physical Activity;Increase Strength and Stamina Increase Physical Activity;Increase Strength and Stamina;Able to understand and use rate of perceived exertion (RPE) scale;Able to understand and use Dyspnea scale;Knowledge and understanding of Target Heart Rate Range (THRR);Able to check pulse independently;Understanding of Exercise Prescription Increase Physical Activity;Increase Strength and Stamina Increase Physical Activity;Increase Strength and Stamina   Comments Reviewed RPE and dyspnea scales, THR and program prescription with pt today.  Pt voiced understanding and was given a copy of goals to take home. Zachory is doing well his first couple of sessions here in rehab. He has already increased to level 5 on the T4 Nustep and tolerating  the rest of his exercise prescription. Will continue to monitor as he progresses throughout the program. Mohmmad has been doing well in rehab and has even added in a third day.  He is already walking at home on his off days.  Reviewed home exercise with pt today.  Pt plans to continue walking at home and add in our staff videos for exercise.  Reviewed THR, pulse, RPE, sign and symptoms, pulse oximetery and when to call 911 or MD.  Also discussed weather considerations and indoor options.  Pt voiced understanding. Caleb reaches [facility] during sessions.  He has missed some due to cellulitis in his leg.  We will continue to monitor progress. Kaceton is walking and trying to do some stretches outside program  sessions.  He hasnt tried [employer] yet.  he does monitor HR and O2 while exercising.   Expected Outcomes Short: Use RPE daily to regulate intensity. Long: Follow program prescription in THR. Short: Continue to build up tolerance on the treadmill Long: Continue to increase overall MET level Short: Start to add in videos on bad weather days Long: Continue to improe stamina. Short: get back to regular attendance Long: improve average MET level Short: maintain conisstent exercise Long: continue to build stamina    Row Name 02/05/22 1412 02/17/22 1037 02/25/22 0757 02/25/22 0812 03/04/22 0734     Exercise Goal Re-Evaluation   Exercise Goals Review Increase Physical Activity;Increase Strength and Stamina Increase Physical Activity;Increase Strength and Stamina Increase Physical Activity;Increase Strength and Stamina -- Increase Physical Activity;Increase Strength and Stamina   Comments Alasdair is doing well and has increased average MET level to 4.4.  he would benefi fromtryig 4lb for strength work.  Staff will monitor progress. Cy hasn't attended rehab since 2/13 due to back pain. He is currently seeing his doctor to be evaluated and see what is going on. There is no new exercise information due to lack of attendance. Keoki had injections yesterday that helped his back.  He walks every day as much as he can and does some stretches recommended by PT.  He does some light weights at home as well. We discussed asking Dr about water walking, as he did that in PT.  Staff also gave information on the guided hikes offered through local rec departments since this would give him a group to go with. Jerris is getting back to regular attendance after missing some due to back pain.  He has incresed to 4 lb for strength work and to level 4 on NS.   Expected Outcomes Short: try 4 lb Long:build overall stamina Short: Talk to doctor and resume exercise again when appropriate Long: Continue to increase overall MET level  Short: get back to regular attendance Long: build overall stamina -- --    Row Name 03/04/22 0738 03/17/22 1521 03/27/22 0801 04/01/22 1331 04/17/22 1517     Exercise Goal Re-Evaluation   Exercise Goals Review -- Increase Physical Activity;Increase Strength and Stamina;Understanding of Exercise Prescription Increase Physical Activity;Increase Strength and Stamina;Understanding of Exercise Prescription Increase Physical Activity;Increase Strength and Stamina Increase Physical Activity;Increase Strength and Stamina;Understanding of Exercise Prescription   Comments -- Jarren is doing well in rehab.  His missed some last week due to his back flaring up again.  He is doing well with the bike and NuStep.  We will continue to montior his progress. Jatorian has a recumbant bike at home that he uses occassionally. He enjoys being outside and walking on trails. We talked about importance of  being mindful  of hot heather conditions and staying hydrated. He still has flare ups which impacts his exercise but feels that he is managing those okay. He is also still having back pain which is another barrier. He tries to exercise an extra 3-4 days outside of rehab. We talked about maybe looking into water aerobics as a different form of exericse (suggested [employer], [facility]).  He should be due for his post 6MWT soon in rehab. Javontae is nearing completion and we expect to see improvement on post walk test. Sadao is doing well in rehab when he is able to attend.  Often, his back inhibits him from coming to exercise.  He was able to he was able to have a procedure yesterday to help with leg pain, but was required to stay off of them for a few days.  He hopes to return next week.   Expected Outcomes Short: attend consistently Long:  improve average MET level Short: Continue to attend as back allows Long: Conitnue to improve stamina Short: Look into water aerobics to help with pain  Long: Exercise independently at home at appropriate  prescription Short: improve post walk Long:  complete LW Short: Improve post walk Long: Continue to improve stamina    Row Name 04/30/22 1630             Exercise Goal Re-Evaluation   Exercise Goals Review Increase Physical Activity;Increase Strength and Stamina;Understanding of Exercise Prescription       Comments Lanell has not attended rehab since last reivew. Staff will continue to contact with patient to get updates.       Expected Outcomes Short: Attend rehab consistently Long: Continue to increase overall MET level                Discharge Exercise Prescription (Final Exercise Prescription Changes):  Exercise Prescription Changes - 04/17/22 1500       Response to Exercise   Blood Pressure (Admit) 134/60    Blood Pressure (Exit) 132/60    Heart Rate (Admit) 80 bpm    Heart Rate (Exercise) 119 bpm    Heart Rate (Exit) 90 bpm    Oxygen Saturation (Admit) 95 %    Oxygen Saturation (Exercise) 92 %    Oxygen Saturation (Exit) 97 %    Rating of Perceived Exertion (Exercise) 13    Perceived Dyspnea (Exercise) 3    Symptoms SOB, back pain    Duration Continue with 30 min of aerobic exercise without signs/symptoms of physical distress.    Intensity THRR unchanged      Progression   Progression Continue to progress workloads to maintain intensity without signs/symptoms of physical distress.    Average METs 3.78      Resistance Training   Training Prescription Yes    Weight 4 lb    Reps 10-15      Interval Training   Interval Training No      Treadmill   MPH 3    Grade 0    Minutes 15    METs 3.3      Recumbant Bike   Level 4    Minutes 15    METs 4.9      NuStep   Level 4    Minutes 15    METs 3.6      Recumbant Elliptical   Level 3    Minutes 15    METs 3.3      Home Exercise Plan   Plans to continue exercise at  Home (comment)   walking, staff videos   Frequency Add 2 additional days to program exercise sessions.    Initial Home Exercises Provided  12/30/21      Oxygen   Maintain Oxygen Saturation 88% or higher             Nutrition:  Target Goals: Understanding of nutrition guidelines, daily intake of sodium <153m, cholesterol <2076m calories 30% from fat and 7% or less from saturated fats, daily to have 5 or more servings of fruits and vegetables.  Education: All About Nutrition: -Group instruction provided by verbal, written material, interactive activities, discussions, models, and posters to present general guidelines for heart healthy nutrition including fat, fiber, MyPlate, the role of sodium in heart healthy nutrition, utilization of the nutrition label, and utilization of this knowledge for meal planning. Follow up email sent as well. Written material given at graduation.   Biometrics:  Pre Biometrics - 12/04/21 1245       Pre Biometrics   Height 6' 2.5" (1.892 m)    Weight 238 lb 3.2 oz (108 kg)    BMI (Calculated) 30.18    Single Leg Stand --   declined             Nutrition Therapy Plan and Nutrition Goals:  Nutrition Therapy & Goals - 12/31/21 0843       Nutrition Therapy   Diet Heart healthy, low Na, pulmonary MNT    Drug/Food Interactions Statins/Certain Fruits    Protein (specify units) 100-110g    Fiber 30 grams    Whole Grain Foods 3 servings    Saturated Fats 16 max. grams    Fruits and Vegetables 8 servings/day    Sodium 2 grams      Personal Nutrition Goals   Nutrition Goal ST: prepping his chicken beforehand or cutting vegetables as this is time consuming and can be more versatile, prepping and freezing his breakfast  LT: prepare meals more efficiently to reduce fatigue, limit Na <2g/day, limit saturated fat <16g/day    Comments [age].o. M admitted to pulmonary rehab for asthma also presenting with COPD, HTN, stg 3 CKD, osteoporosis, lupus. Relevant medications include vit D, iron, MVI, prednisone, crestor, zoloft, cellcept. PYP 44.  Vegetables & Fruits 6/12. Breads, Grains & Cereals  6/12. Red & Processed Meat 4/12. Poultry 2/2. Fish & Shellfish 1/4. Beans, Nuts & Seeds 3/4. Milk & Dairy Foods 3/6. Toppings, Oils, Seasonings & Salt 6/20. Sweets, Snacks & Restaurant Food 5/14. Beverages 8/10. B: sometimes: bagel sandwich or cream cheese or breakfast sandwich. L: vaires: tries for a bigger meal at lunch. Soup and sandwich or just soup or sandwich D: Protein and vegetables (mostly starchy vegetables). Drinks: "too much soda" - diet Snacks: m&ms, chips, crackers with peanut butter. BaNaethaneports his diet is not the best and that he should make changes like not having sausage so often. BaTanaveels that chocolate and soda are his weaknessess. He feels his major barrier is his energy. His good days he is a 6 or 7,, but it is rare (maybe once every couple of weeks). Normally he is at a 1-2 for energy. . Discussed heart healthy eating and pulmonary MNT. Discussed ingredient prepping - he has tried meal prepping and freezer meals before but it just went to waste. Suggested starting slowly with prepping his chicken beforehand or cutting vegetables as this is time consuming and can be more versatile to prevent food waste. Also suggested prepping his breakfast and freezing it week  to week as he eats close to the same thing daily.      Intervention Plan   Intervention Prescribe, educate and counsel regarding individualized specific dietary modifications aiming towards targeted core components such as weight, hypertension, lipid management, diabetes, heart failure and other comorbidities.    Expected Outcomes Short Term Goal: Understand basic principles of dietary content, such as calories, fat, sodium, cholesterol and nutrients.;Short Term Goal: A plan has been developed with personal nutrition goals set during dietitian appointment.;Long Term Goal: Adherence to prescribed nutrition plan.             Nutrition Assessments:  MEDIFICTS Score Key: ?70 Need to make dietary changes  40-70 Heart  Healthy Diet ? 40 Therapeutic Level Cholesterol Diet  Flowsheet Row Pulmonary Rehab from 12/04/2021 in [facility]  Picture Your Plate Total Score on Admission 44      Picture Your Plate Scores: <52 Unhealthy dietary pattern with much room for improvement. 41-50 Dietary pattern unlikely to meet recommendations for good health and room for improvement. 51-60 More healthful dietary pattern, with some room for improvement.  >60 Healthy dietary pattern, although there may be some specific behaviors that could be improved.   Nutrition Goals Re-Evaluation:  Nutrition Goals Re-Evaluation     Row Name 12/30/21 [phone] 01/21/22 0759 02/25/22 0800 03/25/22 0757       Goals   Nutrition Goal Need nutrition appt -- -- ST: check in with hunger cues before during and after eating as he feels that sometimes he does not do this. Keep a food diary of 3 random days to see if there are any excess calories in his diet changes. LT: prepare meals more efficiently to reduce fatigue, limit Na <2g/day, limit saturated fat <16g/day    Comment Shamond needs to schedule appt with dietitian Arrin has been meal prepping and it is going well so far.  He is reading labels to monitor sodium and fat. Treon has been preparing more meals at home and eating out less.  He is also watching portion sizes. Creedon reports since cooking more foods at home, increasing vegetables, and avoiding fried foods he has gained some weight since starting rehab ~5lbs. Discussed potential reasons such as how sometimes we can eat more unintentionally when we feel better, if we go back to eating normally from not eating enough it can cause rebound weight gain as well, and with exercise muscle may be increasing. Suggested checking in with hunger cues before during and after eating as he feels that sometimes he does not do this and keeping a food diary of 3 random days to see if there are any excess calories in his diet changes. Diet  changes made, if eating the same amount of food, would typically result in fewer calories overall.    Expected Outcome Meet with dietitian Short: continue to meal prep Long:  maintain RD recommendations ST/LT: continue to work on RD recommendations ST: check in with hunger cues before during and after eating as he feels that sometimes he does not do this. Keep a food diary of 3 random days to see if there are any excess calories in his diet changes. LT: prepare meals more efficiently to reduce fatigue, limit Na <2g/day, limit saturated fat <16g/day             Nutrition Goals Discharge (Final Nutrition Goals Re-Evaluation):  Nutrition Goals Re-Evaluation - 03/25/22 0757       Goals   Nutrition Goal ST: check in  with hunger cues before during and after eating as he feels that sometimes he does not do this. Keep a food diary of 3 random days to see if there are any excess calories in his diet changes. LT: prepare meals more efficiently to reduce fatigue, limit Na <2g/day, limit saturated fat <16g/day    Comment Tramayne reports since cooking more foods at home, increasing vegetables, and avoiding fried foods he has gained some weight since starting rehab ~5lbs. Discussed potential reasons such as how sometimes we can eat more unintentionally when we feel better, if we go back to eating normally from not eating enough it can cause rebound weight gain as well, and with exercise muscle may be increasing. Suggested checking in with hunger cues before during and after eating as he feels that sometimes he does not do this and keeping a food diary of 3 random days to see if there are any excess calories in his diet changes. Diet changes made, if eating the same amount of food, would typically result in fewer calories overall.    Expected Outcome ST: check in with hunger cues before during and after eating as he feels that sometimes he does not do this. Keep a food diary of 3 random days to see if there are any  excess calories in his diet changes. LT: prepare meals more efficiently to reduce fatigue, limit Na <2g/day, limit saturated fat <16g/day             Psychosocial: Target Goals: Acknowledge presence or absence of significant depression and/or stress, maximize coping skills, provide positive support system. Participant is able to verbalize types and ability to use techniques and skills needed for reducing stress and depression.   Education: Stress, Anxiety, and Depression - Group verbal and visual presentation to define topics covered.  Reviews how body is impacted by stress, anxiety, and depression.  Also discusses healthy ways to reduce stress and to treat/manage anxiety and depression.  Written material given at graduation.   Education: Sleep Hygiene -Provides group verbal and written instruction about how sleep can affect your health.  Define sleep hygiene, discuss sleep cycles and impact of sleep habits. Review good sleep hygiene tips.    Initial Review & Psychosocial Screening:   Quality of Life Scores:  Scores of 19 and below usually indicate a poorer quality of life in these areas.  A difference of  2-3 points is a clinically meaningful difference.  A difference of 2-3 points in the total score of the Quality of Life Index has been associated with significant improvement in overall quality of life, self-image, physical symptoms, and general health in studies assessing change in quality of life.  PHQ-9: Review Flowsheet        04/09/2022 03/04/2022 02/25/2022 02/03/2022 12/30/2021  Depression screen PHQ 2/9  Decreased Interest 0 _0 0  Down, Depressed, Hopeless 1 0 1 1 0  PHQ - 2 Score _1 0  Altered sleeping _2 Tired, decreased energy _3 Change in appetite 3 0 1 3 0  Feeling bad or failure about yourself  0 0 0 0 0  Trouble concentrating 0 _4 Moving slowly or fidgety/restless 0 0 1 1 0  Suicidal thoughts 0 0 0 0 0  PHQ-9 Score _5 Difficult doing work/chores Extremely dIfficult Somewhat difficult  Very difficult Somewhat difficult  Interpretation of Total Score  Total Score Depression Severity:  1-4 = Minimal depression, 5-9 = Mild depression, 10-14 = Moderate depression, 15-19 = Moderately severe depression, 20-27 = Severe depression   Psychosocial Evaluation and Intervention:   Psychosocial Re-Evaluation:  Psychosocial Re-Evaluation     Row Name 12/30/21 0849 01/21/22 0754 02/03/22 0826 02/25/22 0801 02/25/22 0817     Psychosocial Re-Evaluation   Current issues with Current Psychotropic Meds;Current Sleep Concerns;Current Stress Concerns;Current Depression Current Psychotropic Meds;Current Sleep Concerns;Current Depression History of Depression;Current Depression;Current Psychotropic Meds History of Depression;Current Psychotropic Meds;Current Depression --   Comments Reviewed patient health questionnaire (PHQ-9) with patient for follow up. Previously, patients score indicated signs/symptoms of depression.  Reviewed to see if patient is improving symptom wise while in program.  Score improved2 and patient states that it is because he has been moving more and feeling better.  His biggest source of stress is his healthy and lupus flare ups.  Besides his lupus keeping him up, he also has a difficult going to sleep at night.  One of his biggest complaints is that he is not sleeping well so that he is fatigued a lot. Given his sleep issues, we sent him some sleep information to review and watch. Sleep is still not so good.  He has been on prednisone and lasy dose is tomorrow.  He has talked with his Dr about sleep.  He goes for walks and listens to music to help with stress. Reviewed patient health questionnaire (PHQ-9) with patient for follow up. Previously, patients score indicated signs/symptoms of depression.  Reviewed to see if patient is improving symptom wise while in program.  Score declined and patient states  that it is because he has not worked since June. He is still having back pain and breathing issues. He is working hard and is determinded to get better. Hersh states all his health issues are depressing.  He likes hiking and kayaking and cannot do those activities due to his back.  He had injections yesterday that helped his back.  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 Reviewed PHQ -score 11 down from last time. Demondre is currently on medication to help with depression.  He states low energy and sleep issues are due to medication and back pain.   Expected Outcomes Short: Continue to exericse for mental boost and review sleep info Long; Conitnue to manage lupus Short:  continue ot exercise and walk to manage stress Long: keep stress low long term Short: Continue to work toward an improvement in PHQ9 scores by attending [facility] regularly. Long: Continue to improve stress and depression coping skills by talking with staff and attending [facility] regularly and work toward a positive mental state. Short:  continue to exercise to help manage depression Long: manage depression long term --   Interventions Encouraged to attend Pulmonary Rehabilitation for the exercise;Stress management education -- Encouraged to attend Pulmonary Rehabilitation for the exercise -- --   Continue Psychosocial Services  Follow up required by staff -- Follow up required by staff -- --    [place] Name 03/27/22 0809             Psychosocial Re-Evaluation   Current issues with History of Depression;Current Psychotropic Meds;Current Depression       Comments Sylvanus states that he still struggles battling with his medical problems and feels that is does affect his emotional state. He does enjoy going outside on trails to walk to help clear  his mind. He is worried his movement is hindered by everything going on and is looking into doing water aerobics as a different plan for exercise. He has been having  tremors periodically that his doctor is aware of and is seeing Neurology at the end of this month. He is also having to make a decision whether he wants to pursue back surgery or not. He is staying compliant with his medications for mental health. He will be more relieved when he hears back from his neurologist and answers about his plan for his back.       Expected Outcomes Short:  Continue to exercise to help manage stress and emotional state Long: Manage depression long term       Interventions Encouraged to attend Pulmonary Rehabilitation for the exercise       Continue Psychosocial Services  Follow up required by staff                Psychosocial Discharge (Final Psychosocial Re-Evaluation):  Psychosocial Re-Evaluation - 03/27/22 0809       Psychosocial Re-Evaluation   Current issues with History of Depression;Current Psychotropic Meds;Current Depression    Comments Camdyn states that he still struggles battling with his medical problems and feels that is does affect his emotional state. He does enjoy going outside on trails to walk to help clear his mind. He is worried his movement is hindered by everything going on and is looking into doing water aerobics as a different plan for exercise. He has been having tremors periodically that his doctor is aware of and is seeing Neurology at the end of this month. He is also having to make a decision whether he wants to pursue back surgery or not. He is staying compliant with his medications for mental health. He will be more relieved when he hears back from his neurologist and answers about his plan for his back.    Expected Outcomes Short:  Continue to exercise to help manage stress and emotional state Long: Manage depression long term    Interventions Encouraged to attend Pulmonary Rehabilitation for the exercise    Continue Psychosocial Services  Follow up required by staff             Education: Education Goals: Education classes will  be provided on a weekly basis, covering required topics. Participant will state understanding/return demonstration of topics presented.  Learning Barriers/Preferences:   General Pulmonary Education Topics:  Infection Prevention: - Provides verbal and written material to individual with discussion of infection control including proper hand washing and proper equipment cleaning during exercise session. Flowsheet Row Pulmonary Rehab from 10/23/2021 in [facility] Cardiac and Pulmonary Rehab  Date 10/23/21  Educator [facility]  Instruction Review Code 1- Verbalizes Understanding       Falls Prevention: - Provides verbal and written material to individual with discussion of falls prevention and safety. Flowsheet Row Pulmonary Rehab from 10/23/2021 in [facility] Cardiac and Pulmonary Rehab  Date 10/23/21  Educator [facility]  Instruction Review Code 1- Verbalizes Understanding       Chronic Lung Disease Review: - Group verbal instruction with posters, models, PowerPoint presentations and videos,  to review new updates, new respiratory medications, new advancements in procedures and treatments. Providing information on websites and "800" numbers for continued self-education. Includes information about supplement oxygen, available portable oxygen systems, continuous and intermittent flow rates, oxygen safety, concentrators, and Medicare reimbursement for oxygen. Explanation of Pulmonary Drugs, including class, frequency, complications, importance of spacers, rinsing mouth after steroid  MDI's, and proper cleaning methods for nebulizers. Review of basic lung anatomy and physiology related to function, structure, and complications of lung disease. Review of risk factors. Discussion about methods for diagnosing sleep apnea and types of masks and machines for OSA. Includes a review of the use of types of environmental controls: home humidity, furnaces, filters, dust mite/pet prevention, HEPA vacuums. Discussion about weather  changes, air quality and the benefits of nasal washing. Instruction on Warning signs, infection symptoms, calling MD promptly, preventive modes, and value of vaccinations. Review of effective airway clearance, coughing and/or vibration techniques. Emphasizing that all should Create an Action Plan. Written material given at graduation. Flowsheet Row Pulmonary Rehab from 02/27/2022 in [facility] Cardiac and Pulmonary Rehab  Date 01/30/22  Educator [facility]  Instruction Review Code 1- Verbalizes Understanding       AED/CPR: - Group verbal and written instruction with the use of models to demonstrate the basic use of the AED with the basic ABC's of resuscitation.    Anatomy and Cardiac Procedures: - Group verbal and visual presentation and models provide information about basic cardiac anatomy and function. Reviews the testing methods done to diagnose heart disease and the outcomes of the test results. Describes the treatment choices: Medical Management, Angioplasty, or Coronary Bypass Surgery for treating various heart conditions including Myocardial Infarction, Angina, Valve Disease, and Cardiac Arrhythmias.  Written material given at graduation. Flowsheet Row Pulmonary Rehab from 02/27/2022 in [facility] Cardiac and Pulmonary Rehab  Date 12/26/21  Educator [facility]  Instruction Review Code 1- Verbalizes Understanding       Medication Safety: - Group verbal and visual instruction to review commonly prescribed medications for heart and lung disease. Reviews the medication, class of the drug, and side effects. Includes the steps to properly store meds and maintain the prescription regimen.  Written material given at graduation. Flowsheet Row Pulmonary Rehab from 02/27/2022 in [facility] Cardiac and Pulmonary Rehab  Date 01/09/22  Educator [facility]  Instruction Review Code 1- Verbalizes Understanding       Other: -Provides group and verbal instruction on various topics (see comments)   Knowledge Questionnaire Score:   Knowledge Questionnaire Score - 12/04/21 1237       Knowledge Questionnaire Score   Pre Score 14/18: Lung disease, Oxygen, Exercise              Core Components/Risk Factors/Patient Goals at Admission:  Personal Goals and Risk Factors at Admission - 12/04/21 1255       Core Components/Risk Factors/Patient Goals on Admission    Weight Management Yes;Weight Loss    Intervention Weight Management: Develop a combined nutrition and exercise program designed to reach desired caloric intake, while maintaining appropriate intake of nutrient and fiber, sodium and fats, and appropriate energy expenditure required for the weight goal.;Weight Management: Provide education and appropriate resources to help participant work on and attain dietary goals.;Weight Management/Obesity: Establish reasonable short term and long term weight goals.;Obesity: Provide education and appropriate resources to help participant work on and attain dietary goals.    Admit Weight 238 lb (108 kg)    Goal Weight: Short Term 233 lb (105.7 kg)    Goal Weight: Long Term 215 lb (97.5 kg)    Expected Outcomes Short Term: Continue to assess and modify interventions until short term weight is achieved;Long Term: Adherence to nutrition and physical activity/exercise program aimed toward attainment of established weight goal;Weight Loss: Understanding of general recommendations for a balanced deficit meal plan, which promotes 1-2 lb weight loss per week  and includes a negative energy balance of [phone] kcal/d;Understanding recommendations for meals to include 15-35% energy as protein, 25-35% energy from fat, 35-60% energy from carbohydrates, less than 258m of dietary cholesterol, 20-35 gm of total fiber daily;Understanding of distribution of calorie intake throughout the day with the consumption of 4-5 meals/snacks    Improve shortness of breath with ADL's Yes    Intervention Provide education, individualized exercise plan and daily  activity instruction to help decrease symptoms of SOB with activities of daily living.    Expected Outcomes Short Term: Improve cardiorespiratory fitness to achieve a reduction of symptoms when performing ADLs;Long Term: Be able to perform more ADLs without symptoms or delay the onset of symptoms    Hypertension Yes    Intervention Provide education on lifestyle modifcations including regular physical activity/exercise, weight management, moderate sodium restriction and increased consumption of fresh fruit, vegetables, and low fat dairy, alcohol moderation, and smoking cessation.;Monitor prescription use compliance.    Expected Outcomes Short Term: Continued assessment and intervention until BP is < 140/928mHG in hypertensive participants. < 130/8025mG in hypertensive participants with diabetes, heart failure or chronic kidney disease.;Long Term: Maintenance of blood pressure at goal levels.    Lipids Yes    Intervention Provide education and support for participant on nutrition & aerobic/resistive exercise along with prescribed medications to achieve LDL <8m12mDL >40mg47m Expected Outcomes Short Term: Participant states understanding of desired cholesterol values and is compliant with medications prescribed. Participant is following exercise prescription and nutrition guidelines.;Long Term: Cholesterol controlled with medications as prescribed, with individualized exercise RX and with personalized nutrition plan. Value goals: LDL < 8mg,45m > 40 mg.             Education:Diabetes - Individual verbal and written instruction to review signs/symptoms of diabetes, desired ranges of glucose level fasting, after meals and with exercise. Acknowledge that pre and post exercise glucose checks will be done for 3 sessions at entry of program.   Know Your Numbers and Heart Failure: - Group verbal and visual instruction to discuss disease risk factors for cardiac and pulmonary disease and treatment  options.  Reviews associated critical values for Overweight/Obesity, Hypertension, Cholesterol, and Diabetes.  Discusses basics of heart failure: signs/symptoms and treatments.  Introduces Heart Failure Zone chart for action plan for heart failure.  Written material given at graduation.   Core Components/Risk Factors/Patient Goals Review:   Goals and Risk Factor Review     Row Name 12/30/21 0853 01/21/22 0748 02/25/22 0746 03/27/22 0804       Core Components/Risk Factors/Patient Goals Review   Personal Goals Review Weight Management/Obesity;Hypertension;Lipids;Improve shortness of breath with ADL's Weight Management/Obesity;Hypertension;Improve shortness of breath with ADL's Weight Management/Obesity;Hypertension;Improve shortness of breath with ADL's Weight Management/Obesity;Hypertension;Improve shortness of breath with ADL's    Review Dedric Lorrining well in rehab.  His weight goes up and down and is fairly steady.  However, he did gain weight over the holidays and is trying to get it back off again.  HIs pressures are also up and down and he does keep any eye on them at home. He is doing okay with his breathing, he has good days and bad days.  He does use his PLB. Gabriell's weight still fluctuates some.  He has been on prednisone for back pain.  His BP fluctuates some as well.  [facility] but it went back down. 148/90 resting today.  Breathing has still been an  issue - he continues to use PLB. Alden had injections yesterday for back pain.  He is still on prednisone as well.  He needs to have another back surgery and is trying to avoid surgery as long as possible.  He states he has been eating more at home and less out.  He is also watching portion sizes.  He is concerned that he is stil putting on weight, possibly due to medications.  He is still short of breath a lot of time.  He reports taking all medications as directed.  BP 126/70 at rest. Nazareth's back is still in pain, he  needs surgery but isn't sure if he wants to pursue it. He has an appt in May and will make a decision then. He has overall good & bad days with his breathing. He manages it day by day. He encounters barriers with his pain but feels like he's doing better. He checks his BP at home and they have fluctuated.  He thinks his medical problems play a factor into it. He is staying compliant with his medications. He feels his weight has went up even though he has been more conscious with his diet. He is focusing on reducing sodium and portion sizes. He recently talked to the RD about his weight. He is not quite sure his overall SOB has improved so far but understands he has a lot of barriers that have gotten in the way. He is attending the program consistently and has good and bad breathing days.    Expected Outcomes Short: Continue to work on breathing Long: Continue to monitor risk factors. Short: continue to monitor BP and follow up with Dr if it stays high Long:manage risk factors long term Short:  continue to follow up with Dr about back pain  Long:  continue to manage risk factors Short: Continue to check weight Long: Continue to manage lifestyle risk factors             Core Components/Risk Factors/Patient Goals at Discharge (Final Review):   Goals and Risk Factor Review - 03/27/22 0804       Core Components/Risk Factors/Patient Goals Review   Personal Goals Review Weight Management/Obesity;Hypertension;Improve shortness of breath with ADL's    Review Cassiel's back is still in pain, he needs surgery but isn't sure if he wants to pursue it. He has an appt in May and will make a decision then. He has overall good & bad days with his breathing. He manages it day by day. He encounters barriers with his pain but feels like he's doing better. He checks his BP at home and they have fluctuated.  He thinks his medical problems play a factor into it. He is staying compliant with his medications. He feels his  weight has went up even though he has been more conscious with his diet. He is focusing on reducing sodium and portion sizes. He recently talked to the RD about his weight. He is not quite sure his overall SOB has improved so far but understands he has a lot of barriers that have gotten in the way. He is attending the program consistently and has good and bad breathing days.    Expected Outcomes Short: Continue to check weight Long: Continue to manage lifestyle risk factors             ITP Comments:  ITP Comments     Row Name 12/04/21 1229 12/10/21 1527 12/17/21 0820 12/18/21 0731 12/31/21 0833   ITP Comments Completed  6MWT and gym orientation. Initial ITP created and sent for review to Dr. [name], Medical Director. Dontrell had to delay his 1st day due to falling and wanted to get evaluated for possible injuries. Spoke with patient today who stated he went to Urgent Care and got X Rays and was told nothing was broken. He feels better overall and is planning to start back next week, 12/27 when he returns from out of town. First full day of exercise!  Patient was oriented to gym and equipment including functions, settings, policies, and procedures.  Patient's individual exercise prescription and treatment plan were reviewed.  All starting workloads were established based on the results of the 6 minute walk test done at initial orientation visit.  The plan for exercise progression was also introduced and progression will be customized based on patient's performance and goals. 30 Day review completed. Medical Director ITP review done, changes made as directed, and signed approval by Medical Director. Completed initial RD consultation    Row Name 01/09/22 0755 01/15/22 0900 02/12/22 0842 02/17/22 1039 03/12/22 0835   ITP Comments Pt able to follow exercise prescription today without complaint.  Pt has been out due to cellulitis in leg. He stated his leg is some better and is able to exercise today  at a lower level. Will continue to monitor for progression. 30 Day review completed. Medical Director ITP review done, changes made as directed, and signed approval by Medical Director. 30 Day review completed. Medical Director ITP review done, changes made as directed, and signed approval by Medical Director. Omario has been out of rehab since 2/13 due to back pain. He is getting evaluated by his doctor and will try to resume exercise when appropriate. 30 Day review completed. Medical Director ITP review done, changes made as directed, and signed approval by Medical Director.    Has returned to program    Row Name 04/09/22 1333 04/30/22 1631 05/05/22 1054 05/07/22 0845 05/13/22 1454   ITP Comments 30 Day review completed. Medical Director ITP review done, changes made as directed, and signed approval by Medical Director. Fay has not attended rehab since last reivew. Last attended 4/19. Staff will continue to contact with patient to get updates. Patient is still out from rehab. Staff attempted to call patient to follow up and patient stated he has been still dealing with his back pain, doctor says he can still exercise and base it off how he feels. Discussed with patient about possible discharge if can't come consistently due to his back. Patient is going to try to come tomorrow and will call us if he can't. 30 Day review completed. Medical Director ITP review done, changes made as directed, and signed approval by Medical Director. Gerad is unable to return to rehab right now due to complications with his back and will need to be discharged at this time.            Comments: Discharge ITP

## 2022-05-14 ENCOUNTER — Emergency Department: Payer: 59

## 2022-05-14 ENCOUNTER — Encounter: Payer: Self-pay | Admitting: Emergency Medicine

## 2022-05-14 ENCOUNTER — Emergency Department
Admission: EM | Admit: 2022-05-14 | Discharge: 2022-05-15 | Disposition: A | Discharge status: Home / Self Care | Payer: 59 | Attending: Emergency Medicine | Admitting: Emergency Medicine

## 2022-05-14 ENCOUNTER — Other Ambulatory Visit: Payer: Self-pay

## 2022-05-14 DIAGNOSIS — R21 Rash and other nonspecific skin eruption: Secondary | ICD-10-CM | POA: Diagnosis not present

## 2022-05-14 DIAGNOSIS — R519 Headache, unspecified: Secondary | ICD-10-CM | POA: Diagnosis present

## 2022-05-14 DIAGNOSIS — J449 Chronic obstructive pulmonary disease, unspecified: Secondary | ICD-10-CM | POA: Insufficient documentation

## 2022-05-14 DIAGNOSIS — Z7901 Long term (current) use of anticoagulants: Secondary | ICD-10-CM | POA: Diagnosis not present

## 2022-05-14 DIAGNOSIS — M7989 Other specified soft tissue disorders: Secondary | ICD-10-CM | POA: Diagnosis not present

## 2022-05-14 DIAGNOSIS — I82812 Embolism and thrombosis of superficial veins of left lower extremities: Secondary | ICD-10-CM

## 2022-05-14 DIAGNOSIS — N189 Chronic kidney disease, unspecified: Secondary | ICD-10-CM | POA: Insufficient documentation

## 2022-05-14 DIAGNOSIS — M542 Cervicalgia: Secondary | ICD-10-CM | POA: Diagnosis not present

## 2022-05-14 LAB — CBC WITH DIFFERENTIAL/PLATELET
Abs Immature Granulocytes: 0.01 10*3/uL (ref 0.00–0.07)
Basophils Absolute: 0 10*3/uL (ref 0.0–0.1)
Basophils Relative: 0 %
Eosinophils Absolute: 0.1 10*3/uL (ref 0.0–0.5)
Eosinophils Relative: 1 %
HCT: 47 % (ref 39.0–52.0)
Hemoglobin: 14.9 g/dL (ref 13.0–17.0)
Immature Granulocytes: 0 %
Lymphocytes Relative: 27 %
Lymphs Abs: 1.4 10*3/uL (ref 0.7–4.0)
MCH: 29 pg (ref 26.0–34.0)
MCHC: 31.7 g/dL (ref 30.0–36.0)
MCV: 91.6 fL (ref 80.0–100.0)
Monocytes Absolute: 0.9 10*3/uL (ref 0.1–1.0)
Monocytes Relative: 18 %
Neutro Abs: 2.7 10*3/uL (ref 1.7–7.7)
Neutrophils Relative %: 54 %
Platelets: 157 10*3/uL (ref 150–400)
RBC: 5.13 MIL/uL (ref 4.22–5.81)
RDW: 14.8 % (ref 11.5–15.5)
WBC: 5 10*3/uL (ref 4.0–10.5)
nRBC: 0 % (ref 0.0–0.2)

## 2022-05-14 LAB — COMPREHENSIVE METABOLIC PANEL
ALT: 21 U/L (ref 0–44)
AST: 23 U/L (ref 15–41)
Albumin: 3.5 g/dL (ref 3.5–5.0)
Alkaline Phosphatase: 90 U/L (ref 38–126)
Anion gap: 6 (ref 5–15)
BUN: 24 mg/dL — ABNORMAL HIGH (ref 6–20)
CO2: 26 mmol/L (ref 22–32)
Calcium: 8.6 mg/dL — ABNORMAL LOW (ref 8.9–10.3)
Chloride: 105 mmol/L (ref 98–111)
Creatinine, Ser: 1.6 mg/dL — ABNORMAL HIGH (ref 0.61–1.24)
GFR, Estimated: 49 mL/min — ABNORMAL LOW (ref >60)
Glucose, Bld: 91 mg/dL (ref 70–99)
Potassium: 3.8 mmol/L (ref 3.5–5.1)
Sodium: 137 mmol/L (ref 135–145)
Total Bilirubin: 0.8 mg/dL (ref 0.3–1.2)
Total Protein: 6.8 g/dL (ref 6.5–8.1)

## 2022-05-14 LAB — LIPASE, BLOOD: Lipase: 41 U/L (ref 11–51)

## 2022-05-14 MED ORDER — DIPHENHYDRAMINE HCL 50 MG/ML IJ SOLN
50.0000 mg | Freq: Once | INTRAMUSCULAR | Status: AC
  Administered 2022-05-15: 50 mg via INTRAVENOUS
  Filled 2022-05-14: qty 1

## 2022-05-14 MED ORDER — METOCLOPRAMIDE HCL 5 MG/ML IJ SOLN
10.0000 mg | Freq: Once | INTRAMUSCULAR | Status: AC
  Administered 2022-05-15: 10 mg via INTRAVENOUS
  Filled 2022-05-14: qty 2

## 2022-05-14 MED ORDER — IOHEXOL 350 MG/ML SOLN
75.0000 mL | Freq: Once | INTRAVENOUS | Status: AC | PRN
  Administered 2022-05-14: 75 mL via INTRAVENOUS

## 2022-05-14 MED ORDER — SODIUM CHLORIDE 0.9 % IV BOLUS
500.0000 mL | Freq: Once | INTRAVENOUS | Status: AC
  Administered 2022-05-15: 500 mL via INTRAVENOUS

## 2022-05-14 NOTE — ED Provider Notes (Signed)
[facility] Provider Note  Patient Contact: 11:12 PM (approximate)   History   Neck Pain, Headache, and Rash   HPI  Jonathon Snow is a 61 y.o. male with a history of venous sinus thrombosis (2014), DVT (with IVC filter), PE, currently anticoagulated with Eliquis, COPD, chronic kidney disease and lupus presents to the emergency department with an atypical headache for the past 2 weeks.  Patient describes his headache as frontal and radiating to the back of his head.  He rates it as 4 out of 10 in intensity and states that he has had some photophobia but no phonophobia.  He states that his headache is not as severe as when he was diagnosed with a venous sinus thrombosis but states that he has not had a headache since starting anticoagulation for his venous sinus thrombosis.  He is also concerned as he has left leg swelling.  He states that he went to an urgent care and was prescribed Robaxin and experienced little improvement in his neck pain.  He also has an irregularly distributed, erythematous, linear rash that patient states started as vesicular and crusted over along his left upper extremity.  He has recently received treatment for gonorrhea with both doxycycline and Rocephin.  He denies chest pain, chest tightness or abdominal pain.      Physical Exam   Triage Vital Signs: ED Triage Vitals  Enc Vitals Group     BP 05/14/22 2138 (!) 165/99     Pulse Rate 05/14/22 2138 84     Resp 05/14/22 2138 16     Temp 05/14/22 2138 98.1 F (36.7 C)     Temp Source 05/14/22 2138 Oral     SpO2 05/14/22 2138 95 %     Weight 05/14/22 2146 250 lb (113.4 kg)     Height 05/14/22 2146 '6\' 2"'$  (1.88 m)     Head Circumference --      Peak Flow --      Pain Score 05/14/22 2146 4     Pain Loc --      Pain Edu? --      Excl. in [place]? --     Most recent vital signs: Vitals:   05/14/22 2138  BP: (!) 165/99  Pulse: 84  Resp: 16  Temp: 98.1 F (36.7 C)  SpO2: 95%      General: Alert and in no acute distress. Eyes:  PERRL. EOMI. Head: No acute traumatic findings ENT:      Ears: Tms are pearly.       Nose: No congestion/rhinnorhea.      Mouth/Throat: Mucous membranes are moist. Neck: No stridor. No cervical spine tenderness to palpation. Cardiovascular:  Good peripheral perfusion Respiratory: Normal respiratory effort without tachypnea or retractions. Lungs CTAB. Good air entry to the bases with no decreased or absent breath sounds. Gastrointestinal: Bowel sounds 4 quadrants. Soft and nontender to palpation. No guarding or rigidity. No palpable masses. No distention. No CVA tenderness. Musculoskeletal: Patient has symmetric strength in the upper and lower extremities.  Full range of motion to all extremities.  Neurologic:  No gross focal neurologic deficits are appreciated.  Skin: Patient has a macular, erythematous, irregularly distributed rash of the upper extremities with crusting.   ED Results / Procedures / Treatments   Labs (all labs ordered are listed, but only abnormal results are displayed) Labs Reviewed  COMPREHENSIVE METABOLIC PANEL - Abnormal; Notable for the following components:      Result Value  BUN 24 (*)    Creatinine, Ser 1.60 (*)    Calcium 8.6 (*)    GFR, Estimated 49 (*)    All other components within normal limits  CBC WITH DIFFERENTIAL/PLATELET  LIPASE, BLOOD       PROCEDURES:  Critical Care performed: No  Procedures   MEDICATIONS ORDERED IN ED: Medications  metoCLOPramide (REGLAN) injection 10 mg (has no administration in time range)  diphenhydrAMINE (BENADRYL) injection 50 mg (has no administration in time range)  sodium chloride 0.9 % bolus 500 mL (has no administration in time range)  iohexol (OMNIPAQUE) 350 MG/ML injection 75 mL (has no administration in time range)     IMPRESSION / MDM / ASSESSMENT AND PLAN / ED COURSE  I reviewed the triage vital signs and the nursing notes.                               Assessment and plan Differential diagnosis includes acute on chronic venous sinus thrombosis, acute on chronic DVT, cellulitis, migraine, medication side effect... Headache Leg swelling 61 year old male with history of venous sinus thrombosis and prior DVT currently anticoagulated with Eliquis presents to the emergency department with concern for atypical headache and left lower extremity swelling.  Patient was hypertensive at triage but vital signs otherwise reassuring.  He was alert, active and nontoxic-appearing with no apparent neurodeficits.  CMP consistent with patient's baseline labs.  CBC and lipase unremarkable.  CT Venogram in process at this time.  Patient was given Benadryl, Reglan and supplemental fluids for headache and transitioned to main side of the emergency department under the care of attending, Dr. Karma Greaser      FINAL CLINICAL IMPRESSION(S) / ED DIAGNOSES   Final diagnoses:  Acute nonintractable headache, unspecified headache type     Rx / DC Orders   ED Discharge Orders     None        Note:  This document was prepared using Dragon voice recognition software and may include unintentional dictation errors.   Vallarie [place], PA-C 05/14/22 2340    Naaman Plummer, MD 05/18/22 [phone]

## 2022-05-14 NOTE — ED Triage Notes (Signed)
Pt to ED from home c/o neck pain and headache for a couple weeks and [facility] and prescribed muscle relaxer without relief but also noticed rash to left arm for 3-4 days and now right arm today.  States rash is non draining and non itching but stings.  Pt A&Ox4, chest rise even and unlabored, speaking in complete and coherent sentences and in NAD at this time.

## 2022-05-14 NOTE — ED Provider Notes (Signed)
-----------------------------------------   11:30 PM on 05/14/2022 -----------------------------------------  Assuming care from [facility].  In short, Jonathon Snow is a 61 y.o. male with a chief complaint of headache and leg pain/swelling.  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  Refer to the original H&P for additional details.  The current plan of care is to follow up on imaging studies and reassess.  Patient also recently diagnosed with gonorrhea, but has received treatment (and is still taking doxycycline).   Clinical Course as of 05/15/22 0214  Thu May 15, 2022  0058 CT VENOGRAM HEAD I personally reviewed and interpreted the patient's CT venogram head.  I did not see any evidence of any acute abnormality.  The radiologist report agreed that there is no evidence of abnormality including thrombosis. [CF]  0211 US Venous Img Lower Unilateral Left I personally reviewed the left lower extremity venous ultrasound and saw no evidence of acute abnormality.  The radiologist mentioned that there is a thrombus in the great saphenous vein, which is a superficial and not deep vein, but no evidence of DVT.  I reassessed the patient.  He has been sleeping comfortably.  He said his headache is better.  I went over the results with him that he will follow-up with his regular doctor and continue his Eliquis.  I gave my usual and customary return precautions. [CF]    Clinical Course User Index [CF] Hinda Kehr, MD     Hinda Kehr, MD 05/15/22 [phone]

## 2022-05-15 ENCOUNTER — Emergency Department: Payer: 59

## 2022-05-15 ENCOUNTER — Ambulatory Visit: Payer: 59

## 2022-05-15 NOTE — Discharge Instructions (Signed)
As we discussed, fortunately the imaging of your head was reassuring with no evidence of thrombus or other acute abnormality.  You do not have a DVT in your leg, although you do have a thrombus and one of the superficial veins of your leg (called the great saphenous vein).  Please continue your regular medications including your anticoagulation.  Follow-up at the next available opportunity with your regular provider.  You may also wish to call the office of Dr. Delana Meyer and schedule follow-up appointment with vascular surgery, given the superficial thrombus and your complicated history of blood clots.    Return to the emergency department if you develop new or worsening symptoms that concern you.

## 2022-05-16 ENCOUNTER — Telehealth (INDEPENDENT_AMBULATORY_CARE_PROVIDER_SITE_OTHER): Payer: Self-pay

## 2022-05-16 ENCOUNTER — Ambulatory Visit (INDEPENDENT_AMBULATORY_CARE_PROVIDER_SITE_OTHER): Payer: 59 | Admitting: Vascular Surgery

## 2022-05-16 LAB — RPR+HIV+GC+CT PANEL
Chlamydia trachomatis, NAA: NEGATIVE
HIV Screen 4th Generation wRfx: NONREACTIVE
Neisseria Gonorrhoeae by PCR: NEGATIVE
RPR Ser Ql: NONREACTIVE

## 2022-05-16 NOTE — Telephone Encounter (Signed)
Patient made aware with medical recommendations and verbalized understanding. Patient is fine with scheduling appointment

## 2022-05-16 NOTE — Telephone Encounter (Signed)
The patient should be continuing to wear compression and elevate his lower extremity.  We can get him in with a DVT study next week.  He can present to the ED this weekend if he feels he needs immediate evaluation

## 2022-05-16 NOTE — Telephone Encounter (Signed)
I saw the ED notes.  It notes that he has a superficial thrombophlebitis.  He was placed on Eliquis.  Based on this there is no further intervention that needs to be done other than follow up in 6 weeks.  He can use ibuprofen/tylenol for pain and warm compresses.  He can see JD with a dvt study in 6 weeks

## 2022-05-19 ENCOUNTER — Encounter (INDEPENDENT_AMBULATORY_CARE_PROVIDER_SITE_OTHER): Payer: Self-pay | Admitting: Nurse Practitioner

## 2022-05-19 NOTE — Progress Notes (Signed)
Subjective:    Patient ID: Jonathon Snow, male    DOB: July 26, 1961, 61 y.o.   MRN: [id number] Chief Complaint  Patient presents with   Follow-up    4 week post laser     The patient returns to the office for followup status post laser ablation of the left great saphenous vein on 05/13/22.  The patient notes that he continues to have soreness and tenderness along the inner thigh.  The original ablation was done due to the patient's recurrent symptoms of cellulitis.  He still continues to utilize compression stockings.  He does have some bruising in the area but there does not appear acutely inflamed.    The patient is otherwise done well and there have been no complications related to the laser procedure or interval changes in the patient's overall   Venous ultrasound post laser shows successful laser ablation of the left great saphenous vein, no DVT identified.    Review of Systems  Cardiovascular:  Positive for leg swelling.  Hematological:  Bruises/bleeds easily.  All other systems reviewed and are negative.     Objective:   Physical Exam Vitals reviewed.  HENT:     Head: Normocephalic.  Cardiovascular:     Rate and Rhythm: Normal rate.     Pulses: Normal pulses.  Pulmonary:     Effort: Pulmonary effort is normal.  Skin:    General: Skin is warm and dry.     Findings: Bruising present.  Neurological:     Mental Status: He is alert and oriented to person, place, and time.  Psychiatric:        Mood and Affect: Mood normal.        Behavior: Behavior normal.        Thought Content: Thought content normal.        Judgment: Judgment normal.    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   Overview:  superior sagittal sinus, left transverse sinus and cortical veins    COVID-19 virus  infection 12/2020   Depression    DVT (deep venous thrombosis) (HCC)    Dyspnea    GERD (gastroesophageal reflux disease)    Heel spur, left 02/16/2019   Heel spur, right 02/16/2019   Herpes zoster infection of lumbar region 02/20/2020   Hyperlipidemia    Hypertension    Lupus ([place])    Lymphedema 10/07/2018   Morbid obesity ([place])    Opiate abuse, episodic ([place]) 02/26/2018   Osteoporosis    Pneumonia    PONV (postoperative nausea and vomiting)    Postphlebitic syndrome with ulcer, 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   Presence of IVC filter 03/22/2020   Removed   Pulmonary embolism ([place])    Renal disorder    Stage III    Social History   Socioeconomic History   Marital status: Divorced    Spouse name: Not on file   Number of children: Not on file   Years of education: Not on file   Highest education level: Not on file  Occupational History   Not on file  Tobacco Use   Smoking status: Never   Smokeless tobacco: Never  Vaping Use   Vaping Use: Never used  Substance and Sexual Activity   Alcohol  use: No   Drug use: No    Comment: PT DENIES   Sexual activity: Yes  Other Topics Concern   Not on file  Social History Narrative   Not on file   Social Determinants of Health   Financial Resource Strain: Not on file  Food Insecurity: Not on file  Transportation Needs: Not on file  Physical Activity: Not on file  Stress: Not on file  Social Connections: Not on file  Intimate Partner Violence: Not on file    Past Surgical History:  Procedure Laterality Date   ANKLE SURGERY Right    BACK SURGERY     BRONCHIAL WASHINGS N/A 11/01/2021   Procedure: BRONCHIAL WASHINGS;  Surgeon: [name], MD;  Location: [facility] ORS;  Service: Thoracic;  Laterality: N/A;   COLONOSCOPY WITH PROPOFOL N/A 05/28/2020   Procedure: COLONOSCOPY WITH PROPOFOL;  Surgeon: Jonathon Bellows, MD;  Location: [facility] ENDOSCOPY;  Service: Endoscopy;  Laterality: N/A;   CYST EXCISION  92 or 93    Liver cyst removal [facility]    FLEXIBLE BRONCHOSCOPY N/A 11/01/2021   Procedure: FLEXIBLE BRONCHOSCOPY;  Surgeon: [name], MD;  Location: [facility] ORS;  Service: Thoracic;  Laterality: N/A;   I & D EXTREMITY Right 04/29/2017   Procedure: IRRIGATION AND DEBRIDEMENT EXTREMITY;  Surgeon: Clayburn Pert, MD;  Location: [facility] ORS;  Service: General;  Laterality: Right;   IRRIGATION AND DEBRIDEMENT ABSCESS Left 04/29/2017   Procedure: IRRIGATION AND DEBRIDEMENT Scrotal ABSCESS;  Surgeon: Clayburn Pert, MD;  Location: [facility] ORS;  Service: General;  Laterality: Left;    Family History  Problem Relation Age of Onset   Hypertension Father    Heart disease Father    Clotting disorder Mother    Kidney disease Brother    Heart attack Maternal Grandmother    Heart attack Maternal Grandfather    Heart attack Paternal Grandfather     Allergies  Allergen Reactions   Enalapril Other (See Comments)    Unknown reaction   Vicodin [Hydrocodone-Acetaminophen] Hives and Rash    Severe headaches (also) NAME BRAND ONLY PER PT CAN TAKE GENERIC       Latest Ref Rng & Units 05/14/2022   10:56 PM 09/18/2021    3:05 PM 09/10/2021   10:52 AM  CBC  WBC 4.0 - 10.5 K/uL 5.0   6.0   5.2    Hemoglobin 13.0 - 17.0 g/dL 14.9   13.2   11.8    Hematocrit 39.0 - 52.0 % 47.0   41.5   36.7    Platelets 150 - 400 K/uL 157   263   187        CMP     Component Value Date/Time   NA 137 05/14/2022 2256   NA 141 04/17/2016 0924   NA 138 04/10/2015 1036   K 3.8 05/14/2022 2256   K 3.5 04/10/2015 1036   CL 105 05/14/2022 2256   CL 108 04/10/2015 1036   CO2 26 05/14/2022 2256   CO2 25 04/10/2015 1036   GLUCOSE 91 05/14/2022 2256   GLUCOSE 96 04/10/2015 1036   BUN 24 (H) 05/14/2022 2256   BUN 23 04/17/2016 0924   BUN 16 04/10/2015 1036   CREATININE 1.60 (H) 05/14/2022 2256   CREATININE 1.64 (H) 09/18/2021 1505   CALCIUM 8.6 (L) 05/14/2022 2256   CALCIUM 8.4 (L) 04/10/2015 1036   PROT 6.8 05/14/2022 2256   PROT 6.9 04/17/2016 0924    PROT 7.0 04/10/2015 1036   ALBUMIN 3.5 05/14/2022 2256  ALBUMIN 3.9 04/17/2016 0924   ALBUMIN 3.9 04/10/2015 1036   AST 23 05/14/2022 2256   AST 19 04/10/2015 1036   ALT 21 05/14/2022 2256   ALT 16 (L) 04/10/2015 1036   ALKPHOS 90 05/14/2022 2256   ALKPHOS 71 04/10/2015 1036   BILITOT 0.8 05/14/2022 2256   BILITOT 0.4 04/17/2016 0924   BILITOT 0.7 04/10/2015 1036   GFRNONAA 49 (L) 05/14/2022 2256   GFRNONAA 46 (L) 11/07/2020 1020   GFRAA 53 (L) 11/07/2020 1020     No results found.     Assessment & Plan:   1. Varicose 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 Previous ultrasound shows no DVT and successful closure of the left great saphenous vein from the knee to approximately 2 cm from the saphenofemoral junction.  He continues to have some tenderness and soreness in the area which is not completely uncommon post laser ablation.  We will have the patient return in 4 weeks to reevaluate symptoms.   Current Outpatient Medications on File Prior to Visit  Medication Sig Dispense Refill   albuterol (VENTOLIN HFA) 108 (90 Base) MCG/ACT inhaler Inhale 2 puffs into the lungs every 6 (six) hours as needed for wheezing or shortness of breath. 18 g 2   amLODipine (NORVASC) 5 MG tablet Take 1 tablet (5 mg total) by mouth daily. 90 tablet 3   apixaban (ELIQUIS) 5 MG TABS tablet Take 1 tablet (5 mg total) by mouth 2 (two) times daily. 60 tablet 6   cholecalciferol (VITAMIN D3) 25 MCG (1000 UNIT) tablet Take 1,000 Units by mouth daily.     losartan (COZAAR) 100 MG tablet Take 1 tablet (100 mg total) by mouth daily. 90 tablet 1   montelukast (SINGULAIR) 10 MG tablet Take 1 tablet (10 mg total) by mouth at bedtime. 90 tablet 3   Multiple Vitamins-Minerals (MULTIVITAMIN WITH MINERALS) tablet Take 1 tablet by mouth daily.     mycophenolate (CELLCEPT) 500 MG tablet Take 1,000 mg by mouth 2 (two) times daily.     predniSONE (DELTASONE) 10 MG tablet Take 10 mg by mouth daily with  breakfast.     rosuvastatin (CRESTOR) 20 MG tablet Take one pill every other night before bed. 45 tablet 1   sertraline (ZOLOFT) 25 MG tablet Take 1 tablet (25 mg total) by mouth daily. 90 tablet 1   sildenafil (REVATIO) 20 MG tablet 3-5 tablets as needed 30 min prior to intercourse 30 tablet 11   traMADol (ULTRAM) 50 MG tablet Take 1 tablet (50 mg total) by mouth every 6 (six) hours as needed. 20 tablet 0   doxycycline (VIBRAMYCIN) 100 MG capsule Take 1 capsule (100 mg total) by mouth every 12 (twelve) hours. (Patient not taking: Reported on 05/13/2022) 14 capsule 0   Current Facility-Administered Medications on File Prior to Visit  Medication Dose Route Frequency Provider Last Rate Last Admin   cefTRIAXone (ROCEPHIN) injection 1 g  1 g Intramuscular Once McGowan, Shannon A, PA-C        There are no Patient Instructions on file for this visit. No follow-ups on file.   Kris Hartmann, NP

## 2022-05-28 ENCOUNTER — Encounter: Payer: 59 | Admitting: Urology

## 2022-05-28 NOTE — Progress Notes (Incomplete)
 @  Month/Day/Year@ CC: No chief complaint on file.    HPI: Jonathon Snow is a 61 y.o. male with a personal history of with a personal history of scrotal abscess s/p I &D for left scrotal abscess in 2018, high risk hematuria, BPH with LUTS, and ED  who presents today for a cysto/TRUS.   He is managed on flomax and oxybutynin 15 mg XL.    His ED is managed on sildenafil.    He was seen in clinic on 04/27/2022 by Zara Council, PA-C, he was noted to have whitish discharge, redness, and soreness form his penis that had been ongoing for 4 days.  Exam showed purulent discharge from meatus with entire penis being tender to palpation. He was given 1 dose of Rocephin in office and was treated in interim with doxycycline. He was positive for gonorrheae.   There were no vitals filed for this visit. NED. A&Ox3.   No respiratory distress   Abd soft, NT, ND Normal phallus with bilateral descended testicles  Cystoscopy Procedure Note  Patient identification was confirmed, informed consent was obtained, and patient was prepped using Betadine solution.  Lidocaine jelly was administered per urethral meatus.     Pre-Procedure: - Inspection reveals a normal caliber ureteral meatus.  Procedure: The flexible cystoscope was introduced without difficulty - No urethral strictures/lesions are present. - {Blank multiple:19197::"Enlarged","Surgically absent","Normal"} prostate *** - {Blank multiple:19197::"Normal","Elevated","Tight"} bladder neck - Bilateral ureteral orifices identified - Bladder mucosa  reveals no ulcers, tumors, or lesions - No bladder stones - No trabeculation  Retroflexion shows ***   Post-Procedure: - Patient tolerated the procedure well   Prostate transrectal ultrasound sizing   Informed consent was obtained after discussing risks/benefits of the procedure.  A time out was performed to ensure correct patient identity.   Pre-Procedure: -Transrectal probe was placed  without difficulty -Transrectal Ultrasound performed revealing a *** gm prostate measuring *** x *** x *** cm (length) -No significant hypoechoic or median lobe noted   Assessment/ Plan:   No follow-ups on file.  I,Kailey Littlejohn,acting as a Education administrator for Hollice Espy, MD.,have documented all relevant documentation on the behalf of Hollice Espy, MD,as directed by  Hollice Espy, MD while in the presence of Hollice Espy, MD.

## 2022-05-30 ENCOUNTER — Encounter: Payer: Self-pay | Admitting: Urology

## 2022-06-03 ENCOUNTER — Telehealth (INDEPENDENT_AMBULATORY_CARE_PROVIDER_SITE_OTHER): Payer: Self-pay | Admitting: Nurse Practitioner

## 2022-06-03 NOTE — Telephone Encounter (Signed)
LVM for pt TCB and schedule appt. - He may receive 3 sclero appts ONLY with Arna Medici. The Josem Kaufmann will not be good if with another provider.   left leg sclero. Josem Kaufmann: #[id number] exp: 6.19.23 - 9.19.23 (3 units approved)

## 2022-06-26 ENCOUNTER — Encounter: Payer: Self-pay | Admitting: Family Medicine

## 2022-06-26 ENCOUNTER — Ambulatory Visit (INDEPENDENT_AMBULATORY_CARE_PROVIDER_SITE_OTHER): Payer: 59 | Admitting: Family Medicine

## 2022-06-26 VITALS — BP 138/82 | HR 92 | Temp 98.1°F | Resp 16 | Ht 74.5 in | Wt 239.3 lb

## 2022-06-26 DIAGNOSIS — Z5181 Encounter for therapeutic drug level monitoring: Secondary | ICD-10-CM

## 2022-06-26 DIAGNOSIS — Z79899 Other long term (current) drug therapy: Secondary | ICD-10-CM

## 2022-06-26 DIAGNOSIS — J0140 Acute pansinusitis, unspecified: Secondary | ICD-10-CM | POA: Diagnosis not present

## 2022-06-26 DIAGNOSIS — D84821 Immunodeficiency due to drugs: Secondary | ICD-10-CM | POA: Diagnosis not present

## 2022-06-26 DIAGNOSIS — J449 Chronic obstructive pulmonary disease, unspecified: Secondary | ICD-10-CM | POA: Diagnosis not present

## 2022-06-26 MED ORDER — AMOXICILLIN-POT CLAVULANATE 875-125 MG PO TABS: 1.0000 | ORAL_TABLET | Freq: Two times a day (BID) | ORAL | 0 refills | Status: AC

## 2022-06-26 MED ORDER — BENZONATATE 100 MG PO CAPS: 100.0000 mg | ORAL_CAPSULE | Freq: Three times a day (TID) | ORAL | 1 refills | Status: DC | PRN

## 2022-06-26 MED ORDER — ALBUTEROL SULFATE HFA 108 (90 BASE) MCG/ACT IN AERS: 2.0000 | INHALATION_SPRAY | Freq: Four times a day (QID) | RESPIRATORY_TRACT | 2 refills | Status: DC | PRN

## 2022-06-26 MED ORDER — PROMETHAZINE-DM 6.25-15 MG/5ML PO SYRP: 2.5000 mL | ORAL_SOLUTION | Freq: Three times a day (TID) | ORAL | 1 refills | Status: DC | PRN

## 2022-06-26 NOTE — Progress Notes (Signed)
Patient ID: Jonathon Snow, male    DOB: Apr 28, 1961, 62 y.o.   MRN: [id number]  PCP: Delsa Grana, PA-C  Chief Complaint  Patient presents with   Nausea   Cough    Coughing up yellow mucus   Nasal Congestion   Headache    Also feeling facial pressure around eyes and forehead    Subjective:   Jonathon Snow is a 61 y.o. male, presents to clinic with CC of the following:  HPI  Cough and URI symptoms he states that the headache nasal congestion and sinus pain and pressure with postnasal drip and scratchy throat has been ongoing for several weeks and has recently worsened in severity His cough is productive having some coughing fits but he denies any pleuritic chest pain, shortness of breath, fever sweats or chills He is on CellCept and daily prednisone and is immunocompromise  Patient Active Problem List   Diagnosis Date Noted   Varicose 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 01/03/2022   Immunocompromised state due to drug therapy ([place]) 09/18/2021   Prediabetes 11/08/2020   Stage 3a chronic kidney disease ([place]) 11/08/2020   Seasonal allergies 11/08/2020   Rhinosinusitis 11/08/2020   Long term current use of systemic steroids 11/08/2020   Anticoagulant disorder ([place]) 11/07/2020   Senile purpura ([place]) 11/07/2020   History of DVT (deep vein thrombosis) 03/22/2020   MDD (major depressive disorder), recurrent episode, mild ([place]) 09/12/2019   Other spondylosis with radiculopathy, lumbar region 02/16/2019   Osteoporosis 10/12/2018   Chronic venous insufficiency 10/07/2018   SLE glomerulonephritis syndrome, 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[place]) 03/03/2018   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 ([place]) 10/29/2016   Mixed hyperlipidemia 01/15/2016   Benign essential hypertension 01/15/2016   Obesity (BMI 30.0-34.9) 01/15/2016   Long term current use of anticoagulant 10/04/2015   Systemic lupus erythematosus ([place]) 05/30/2015   COPD,  moderate ([place]) 06/27/2014   History of pulmonary embolism 12/11/2013   Nodule of right lung 12/11/2013   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   Cystic disease of liver 08/21/2013      Current Outpatient Medications:    albuterol (VENTOLIN HFA) 108 (90 Base) MCG/ACT inhaler, Inhale 2 puffs into the lungs every 6 (six) hours as needed for wheezing or shortness of breath., Disp: 18 g, Rfl: 2   amLODipine (NORVASC) 5 MG tablet, Take 1 tablet (5 mg total) by mouth daily., Disp: 90 tablet, Rfl: 3   apixaban (ELIQUIS) 5 MG TABS tablet, Take 1 tablet (5 mg total) by mouth 2 (two) times daily., Disp: 60 tablet, Rfl: 6   cholecalciferol (VITAMIN D3) 25 MCG (1000 UNIT) tablet, Take 1,000 Units by mouth daily., Disp: , Rfl:    losartan (COZAAR) 100 MG tablet, Take 1 tablet (100 mg total) by mouth daily., Disp: 90 tablet, Rfl: 1   montelukast (SINGULAIR) 10 MG tablet, Take 1 tablet (10 mg total) by mouth at bedtime., Disp: 90 tablet, Rfl: 3   Multiple Vitamins-Minerals (MULTIVITAMIN WITH MINERALS) tablet, Take 1 tablet by mouth daily., Disp: , Rfl:    mycophenolate (CELLCEPT) 500 MG tablet, Take 1,000 mg by mouth 2 (two) times daily., Disp: , Rfl:    predniSONE (DELTASONE) 10 MG tablet, Take 10 mg by mouth daily with breakfast., Disp: , Rfl:    rosuvastatin (CRESTOR) 20 MG tablet, Take one pill every other night before bed., Disp: 45 tablet, Rfl: 1   sertraline (ZOLOFT) 25 MG tablet, Take 1 tablet (  25 mg total) by mouth daily., Disp: 90 tablet, Rfl: 1   sildenafil (REVATIO) 20 MG tablet, 3-5 tablets as needed 30 min prior to intercourse, Disp: 30 tablet, Rfl: 11   traMADol (ULTRAM) 50 MG tablet, Take 1 tablet (50 mg total) by mouth every 6 (six) hours as needed., Disp: 20 tablet, Rfl: 0   doxycycline (VIBRAMYCIN) 100 MG capsule, Take 1 capsule (100 mg total) by mouth every 12 (twelve) hours. (Patient not taking: Reported on 05/13/2022), Disp: 14 capsule, Rfl: 0  Current Facility-Administered  Medications:    cefTRIAXone (ROCEPHIN) injection 1 g, 1 g, Intramuscular, Once, McGowan, Shannon A, PA-C   Allergies  Allergen Reactions   Enalapril Other (See Comments)    Unknown reaction   Vicodin [Hydrocodone-Acetaminophen] Hives and Rash    Severe headaches (also) NAME BRAND ONLY PER PT CAN TAKE GENERIC     Social History   Tobacco Use   Smoking status: Never   Smokeless tobacco: Never  Vaping Use   Vaping Use: Never used  Substance Use Topics   Alcohol use: No   Drug use: No    Comment: PT DENIES      Chart Review Today: I personally reviewed active problem list, medication list, allergies, family history, social history, health maintenance, notes from last encounter, lab results, imaging with the patient/caregiver today.   Review of Systems  Constitutional: Negative.   HENT: Negative.    Eyes: Negative.   Respiratory: Negative.    Cardiovascular: Negative.   Gastrointestinal: Negative.   Endocrine: Negative.   Genitourinary: Negative.   Musculoskeletal: Negative.   Skin: Negative.   Allergic/Immunologic: Negative.   Neurological: Negative.   Hematological: Negative.   Psychiatric/Behavioral: Negative.    All other systems reviewed and are negative.      Objective:   Vitals:   06/26/22 0824  BP: 138/82  Pulse: 92  Resp: 16  Temp: 98.1 F (36.7 C)  TempSrc: Oral  SpO2: 98%  Weight: 239 lb 4.8 oz (108.5 kg)  Height: 6' 2.5" (1.892 m)    Body mass index is 30.31 kg/m.  Physical Exam Vitals and nursing note reviewed.  Constitutional:      General: He is not in acute distress.    Appearance: Normal appearance. He is well-developed. He is obese. He is ill-appearing (chronically ill appearing). He is not toxic-appearing or diaphoretic.  HENT:     Head: Normocephalic and atraumatic.     Jaw: No trismus.     Right Ear: Tympanic membrane, ear canal and external ear normal. There is no impacted cerumen.     Left Ear: Tympanic membrane, ear canal  and external ear normal. There is no impacted cerumen.     Nose: Mucosal edema, congestion and rhinorrhea present. Rhinorrhea is purulent.     Right Sinus: Maxillary sinus tenderness and frontal sinus tenderness present.     Left Sinus: Maxillary sinus tenderness and frontal sinus tenderness present.     Mouth/Throat:     Mouth: Mucous membranes are moist. Mucous membranes are not pale, not dry and not cyanotic.     Pharynx: Oropharynx is clear. Uvula midline. Posterior oropharyngeal erythema present. No oropharyngeal exudate or uvula swelling.     Tonsils: No tonsillar exudate or tonsillar abscesses.  Eyes:     General: Lids are normal.        Right eye: No discharge.        Left eye: No discharge.     Conjunctiva/sclera: Conjunctivae normal.  Pupils: Pupils are equal, round, and reactive to light.  Neck:     Trachea: Trachea and phonation normal. No tracheal deviation.  Cardiovascular:     Rate and Rhythm: Normal rate and regular rhythm.     Pulses:          Radial pulses are 2+ on the right side and 2+ on the left side.     Heart sounds: Normal heart sounds. No murmur heard.    No friction rub. No gallop.  Pulmonary:     Effort: Pulmonary effort is normal. No tachypnea, accessory muscle usage or respiratory distress.     Breath sounds: No stridor. Examination of the right-lower field reveals decreased breath sounds. Examination of the left-lower field reveals decreased breath sounds. Decreased breath sounds and rhonchi present. No wheezing or rales.     Comments: Poor inspiratory effort which did improve with encouragement Some scattered rhonchi Coughing fits with deeper inspiration No retractions, stridor, auditory wheeze Able to speak in full and complete sentences Abdominal:     General: Bowel sounds are normal. There is no distension.     Palpations: Abdomen is soft.     Tenderness: There is no abdominal tenderness.  Musculoskeletal:        General: Normal range of  motion.     Cervical back: Normal range of motion and neck supple.  Skin:    General: Skin is warm and dry.     Capillary Refill: Capillary refill takes less than 2 seconds.     Coloration: Skin is not pale.     Findings: No rash.     Nails: There is no clubbing.  Neurological:     Mental Status: He is alert and oriented to person, place, and time.     Motor: No abnormal muscle tone.     Coordination: Coordination normal.     Gait: Gait normal.  Psychiatric:        Speech: Speech normal.        Behavior: Behavior normal. Behavior is cooperative.      Results for orders placed or performed during the hospital encounter of 05/14/22  CBC with Differential  Result Value Ref Range   WBC 5.0 4.0 - 10.5 K/uL   RBC 5.13 4.22 - 5.81 MIL/uL   Hemoglobin 14.9 13.0 - 17.0 g/dL   HCT 47.0 39.0 - 52.0 %   MCV 91.6 80.0 - 100.0 fL   MCH 29.0 26.0 - 34.0 pg   MCHC 31.7 30.0 - 36.0 g/dL   RDW 14.8 11.5 - 15.5 %   Platelets 157 150 - 400 K/uL   nRBC 0.0 0.0 - 0.2 %   Neutrophils Relative % 54 %   Neutro Abs 2.7 1.7 - 7.7 K/uL   Lymphocytes Relative 27 %   Lymphs Abs 1.4 0.7 - 4.0 K/uL   Monocytes Relative 18 %   Monocytes Absolute 0.9 0.1 - 1.0 K/uL   Eosinophils Relative 1 %   Eosinophils Absolute 0.1 0.0 - 0.5 K/uL   Basophils Relative 0 %   Basophils Absolute 0.0 0.0 - 0.1 K/uL   Immature Granulocytes 0 %   Abs Immature Granulocytes 0.01 0.00 - 0.07 K/uL  Comprehensive metabolic panel  Result Value Ref Range   Sodium 137 135 - 145 mmol/L   Potassium 3.8 3.5 - 5.1 mmol/L   Chloride 105 98 - 111 mmol/L   CO2 26 22 - 32 mmol/L   Glucose, Bld 91 70 - 99 mg/dL   BUN  24 (H) 6 - 20 mg/dL   Creatinine, Ser 1.60 (H) 0.61 - 1.24 mg/dL   Calcium 8.6 (L) 8.9 - 10.3 mg/dL   Total Protein 6.8 6.5 - 8.1 g/dL   Albumin 3.5 3.5 - 5.0 g/dL   AST 23 15 - 41 U/L   ALT 21 0 - 44 U/L   Alkaline Phosphatase 90 38 - 126 U/L   Total Bilirubin 0.8 0.3 - 1.2 mg/dL   GFR, Estimated 49 (L) >60 mL/min    Anion gap 6 5 - 15  Lipase, blood  Result Value Ref Range   Lipase 41 11 - 51 U/L       Assessment & Plan:     ICD-10-CM   1. Acute pansinusitis, recurrence not specified  J01.40    Ongoing for several weeks, recently worsening in immunocompromised patient, cover with antibiotics - amoxicillin-clavulanate (AUGMENTIN) 875-125 MG tablet; Take 1 tablet by mouth 2 (two) times daily for 10 days.  Dispense: 20 tablet; Refill: 0    2. COPD, moderate ([place])       Currently has cough but denies wheeze shortness of breath or chest pain, closely monitor would give steroid burst if worsening Did encourage him to use Mucinex, over-the-counter cough medications and his inhalers and to follow-up closely if any worsening with his breathing occurs would want to get a chest x-ray, give a dose of steroids and follow him closely\  - albuterol (VENTOLIN HFA) 108 (90 Base) MCG/ACT inhaler; Inhale 2 puffs into the lungs every 6 (six) hours as needed for wheezing or shortness of breath.  Dispense: 18 g; Refill: 2 - benzonatate (TESSALON) 100 MG capsule; Take 1-2 capsules (100-200 mg total) by mouth 3 (three) times daily as needed for cough.  Dispense: 30 capsule; Refill: 1 - promethazine-dextromethorphan (PROMETHAZINE-DM) 6.25-15 MG/5ML syrup; Take 2.5-5 mLs by mouth 3 (three) times daily as needed for cough.  Dispense: 118 mL; Refill: 1    3. Encounter for medication monitoring  Z51.81 Refill on inhaler per pt request albuterol (VENTOLIN HFA) 108 (90 Base) MCG/ACT inhaler    4. Immunocompromised state due to drug therapy ([place])  D84.821   Lower threshold to give antibiotics given his immunocompromise state Z79.899       Return for 1-2 weeks if not improving .    Delsa Grana, PA-C 06/26/22 8:42 AM

## 2022-07-08 ENCOUNTER — Encounter (INDEPENDENT_AMBULATORY_CARE_PROVIDER_SITE_OTHER): Payer: 59

## 2022-07-08 ENCOUNTER — Ambulatory Visit (INDEPENDENT_AMBULATORY_CARE_PROVIDER_SITE_OTHER): Payer: 59 | Admitting: Vascular Surgery

## 2022-07-11 ENCOUNTER — Telehealth (INDEPENDENT_AMBULATORY_CARE_PROVIDER_SITE_OTHER): Payer: Self-pay | Admitting: Nurse Practitioner

## 2022-07-11 NOTE — Telephone Encounter (Signed)
Spoke to pt regarding needing to move his Sclerotherapy appts. I explained that I would have to get a new auth. 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 I will start a new auth next week -  week of 08/14/22. Nothing further needed at this time.

## 2022-07-15 ENCOUNTER — Ambulatory Visit: Payer: 59 | Admitting: Dermatology

## 2022-07-15 ENCOUNTER — Ambulatory Visit: Payer: Self-pay

## 2022-07-15 ENCOUNTER — Ambulatory Visit (INDEPENDENT_AMBULATORY_CARE_PROVIDER_SITE_OTHER): Payer: 59 | Admitting: Nurse Practitioner

## 2022-07-15 NOTE — Telephone Encounter (Signed)
Called pt no answer left detailed message to seek care at ER or at least UC due to his symptoms. Unfortunately we do not have any openings today here at the clinic.

## 2022-07-15 NOTE — Telephone Encounter (Signed)
  Chief Complaint: abdominal pain Symptoms: nausea, abdominal distension, moderate to severe pain Frequency: 2 weeks Pertinent Negatives: Patient denies diarrhea, vomiting Disposition: '[x]'$ ED /'[]'$ Urgent Care (no appt availability in office) / '[]'$ Appointment(In office/virtual)/ '[]'$  [facility]/ '[]'$ Home Care/ '[x]'$ Refused Recommended Disposition /'[]'$  [facility]/ '[]'$  Follow-up with PCP Additional Notes: pt refused to go to ED now- "maybe later or tomorrow" Advised pt that he needs to go to ED for dx. Advised pt that his symptoms are serious.  Reason for Disposition  [1] MILD-MODERATE pain AND [2] constant AND [3] present > 2 hours  Answer Assessment - Initial Assessment Questions 1. LOCATION: "Where does it hurt?"     Front of abdomen to around side 2. RADIATION: "Does the pain shoot anywhere else?" (e.g., chest, back)     no 3. ONSET: "When did the pain begin?" (Minutes, hours or days ago)      2 weeks ago 4. SUDDEN: "Gradual or sudden onset?"     gradually 5. PATTERN "Does the pain come and go, or is it constant?"    - If it comes and goes: "How long does it last?" "Do you have pain now?"     (Note: Comes and goes means the pain is intermittent. It goes away completely between bouts.)    - If constant: "Is it getting better, staying the same, or getting worse?"      (Note: Constant means the pain never goes away completely; most serious pain is constant and gets worse.)      constant 6. SEVERITY: "How bad is the pain?"  (e.g., Scale 1-10; mild, moderate, or severe)    - MILD (1-3): Doesn't interfere with normal activities, abdomen soft and not tender to touch.     - MODERATE (4-7): Interferes with normal activities or awakens from sleep, abdomen tender to touch.     - SEVERE (8-10): Excruciating pain, doubled over, unable to do any normal activities.       Moderate to severe 7. RECURRENT SYMPTOM: "Have you ever had this type of stomach pain before?" If Yes, ask: "When was  the last time?" and "What happened that time?"      Yes- kidney stone 8. CAUSE: "What do you think is causing the stomach pain?"     Kidney stone 9. RELIEVING/AGGRAVATING FACTORS: "What makes it better or worse?" (e.g., antacids, bending or twisting motion, bowel movement)    Did not ask 10. OTHER SYMPTOMS: "Do you have any other symptoms?" (e.g., back pain, diarrhea, fever, urination pain, vomiting)       Nausea, bloating  Protocols used: Abdominal Pain - Male-A-AH

## 2022-08-08 ENCOUNTER — Other Ambulatory Visit: Payer: Self-pay | Admitting: Family Medicine

## 2022-08-08 DIAGNOSIS — I10 Essential (primary) hypertension: Secondary | ICD-10-CM

## 2022-08-12 ENCOUNTER — Ambulatory Visit (INDEPENDENT_AMBULATORY_CARE_PROVIDER_SITE_OTHER): Payer: 59 | Admitting: Nurse Practitioner

## 2022-08-13 ENCOUNTER — Telehealth (INDEPENDENT_AMBULATORY_CARE_PROVIDER_SITE_OTHER): Payer: Self-pay | Admitting: Vascular Surgery

## 2022-08-13 NOTE — Telephone Encounter (Signed)
Patient states his ankle swollen and blistering all around. He states he has new insurance and it will not be in effect until 08/18/22.  He would like to be seen sooner.  Please advise

## 2022-08-13 NOTE — Telephone Encounter (Signed)
I'm not sure what availability JD has but if we don't have availability he ma be able to see his PCP sooner

## 2022-08-15 ENCOUNTER — Other Ambulatory Visit: Payer: Self-pay | Admitting: Family Medicine

## 2022-08-15 DIAGNOSIS — I10 Essential (primary) hypertension: Secondary | ICD-10-CM

## 2022-08-15 MED ORDER — LOSARTAN POTASSIUM 100 MG PO TABS: 100.0000 mg | ORAL_TABLET | Freq: Every day | ORAL | 1 refills | Status: DC

## 2022-08-15 NOTE — Telephone Encounter (Signed)
Requested Prescriptions  Pending Prescriptions Disp Refills  . losartan (COZAAR) 100 MG tablet 90 tablet 1    Sig: Take 1 tablet (100 mg total) by mouth daily.     Cardiovascular:  Angiotensin Receptor Blockers Failed - 08/15/2022 10:23 AM      Failed - Cr in normal range and within 180 days    Creat  Date Value Ref Range Status  09/18/2021 1.64 (H) 0.70 - 1.30 mg/dL Final   Creatinine, Ser  Date Value Ref Range Status  05/14/2022 1.60 (H) 0.61 - 1.24 mg/dL Final   Creatinine, Urine  Date Value Ref Range Status  04/28/2017 74 mg/dL Final         Passed - K in normal range and within 180 days    Potassium  Date Value Ref Range Status  05/14/2022 3.8 3.5 - 5.1 mmol/L Final  04/10/2015 3.5 mmol/L Final    Comment:    3.5-5.1 NOTE: New Reference Range  02/27/15          Passed - Patient is not pregnant      Passed - Last BP in normal range    BP Readings from Last 1 Encounters:  06/26/22 138/82         Passed - Valid encounter within last 6 months    Recent Outpatient Visits          1 month ago Acute pansinusitis, recurrence not specified   [facility] Delsa Grana, PA-C   5 months ago Hypertension, unspecified type   [facility] [place], Kristeen Miss, PA-C   10 months ago Diarrhea, unspecified type   [facility] Rory Percy M, DO   10 months ago Current moderate episode of major depressive disorder, unspecified whether recurrent [facility])   [place], FNP   11 months ago SOB (shortness of breath)   [facility] Delsa Grana, PA-C      Future Appointments            In 2 weeks Pieter Partridge, [facility] [place]   In 3 weeks Delsa Grana, PA-C [facility], [place]   In 2 months Ralene Bathe, MD [place]

## 2022-08-15 NOTE — Telephone Encounter (Signed)
Medication Refill - Medication: losartan (COZAAR) 100 MG tablet  Has the patient contacted their pharmacy? Yes.    (Agent: If yes, when and what did the pharmacy advise?) request was sent  Preferred Pharmacy (with phone number or street name):  [facility] [place] [address] Phone:  [phone]  Fax:  [phone]     Has the patient been seen for an appointment in the last year OR does the patient have an upcoming appointment? Yes.    Agent: Please be advised that RX refills may take up to 3 business days. We ask that you follow-up with your pharmacy.

## 2022-08-18 ENCOUNTER — Encounter: Payer: Self-pay | Admitting: Nurse Practitioner

## 2022-08-18 ENCOUNTER — Ambulatory Visit: Payer: BC Managed Care – PPO | Admitting: Nurse Practitioner

## 2022-08-18 ENCOUNTER — Encounter: Payer: Self-pay | Admitting: *Deleted

## 2022-08-18 ENCOUNTER — Ambulatory Visit: Payer: Self-pay | Admitting: *Deleted

## 2022-08-18 VITALS — BP 140/72 | HR 100 | Temp 98.4°F | Resp 16 | Ht 74.5 in | Wt 240.5 lb

## 2022-08-18 DIAGNOSIS — B351 Tinea unguium: Secondary | ICD-10-CM | POA: Diagnosis not present

## 2022-08-18 DIAGNOSIS — M79675 Pain in left toe(s): Secondary | ICD-10-CM | POA: Diagnosis not present

## 2022-08-18 NOTE — Progress Notes (Signed)
BP (!) 140/72   Pulse 100   Temp 98.4 F (36.9 C) (Oral)   Resp 16   Ht 6' 2.5" (1.892 m)   Wt 240 lb 8 oz (109.1 kg)   SpO2 99%   BMI 30.47 kg/m    Subjective:    Patient ID: ELVYN Snow, male    DOB: 07/20/1961, 61 y.o.   MRN: [id number]  HPI: Jonathon Snow is a 61 y.o. male  Chief Complaint  Patient presents with   Foot Problem    Left foot mainly great toe pt states only sx it hurts. Noticed a few weeks   Left great toe pain and swelling/ venous insufficiency:  Patient states that about two weeks ago he noticed that his left great toe nail was painful and was lifting up.  There is discoloration under the nail. Patient states it might be a wart under his nail.  Likely it is onychomycosis. Will refer to podiatry.  Patient has a history of venous insufficiency and cellulitis.  This does not look like cellulitis at this time.  Patient reports the swelling in his ankle is consistent to his norm.  He says he sees Dr. Lucky Cowboy on Wednesday.  He denies any trauma, fever or chills.    Relevant past medical, surgical, family and social history reviewed and updated as indicated. Interim medical history since our last visit reviewed. Allergies and medications reviewed and updated.  Review of Systems  Constitutional: Negative for fever or weight change.  Respiratory: Negative for cough and shortness of breath.   Cardiovascular: Negative for chest pain or palpitations.  Gastrointestinal: Negative for abdominal pain, no bowel changes.  Musculoskeletal: Negative for gait problem or joint swelling.  Skin: Negative for rash.  Neurological: Negative for dizziness or headache.  No other specific complaints in a complete review of systems (except as listed in HPI above).      Objective:    BP (!) 140/72   Pulse 100   Temp 98.4 F (36.9 C) (Oral)   Resp 16   Ht 6' 2.5" (1.892 m)   Wt 240 lb 8 oz (109.1 kg)   SpO2 99%   BMI 30.47 kg/m   Wt Readings from Last 3 Encounters:   08/18/22 240 lb 8 oz (109.1 kg)  06/26/22 239 lb 4.8 oz (108.5 kg)  05/14/22 250 lb (113.4 kg)    Physical Exam  Constitutional: Patient appears well-developed and well-nourished. Obese  No distress.  HEENT: head atraumatic, normocephalic, pupils equal and reactive to light,  neck supple Cardiovascular: Normal rate, regular rhythm and normal heart sounds.  No murmur heard. No BLE edema. Pulmonary/Chest: Effort normal and breath sounds normal. No respiratory distress. Abdominal: Soft.  There is no tenderness. Left foot: left great toe nail discoloration, no signs of infection Psychiatric: Patient has a normal mood and affect. behavior is normal. Judgment and thought content normal.  Results for orders placed or performed during the hospital encounter of 05/14/22  CBC with Differential  Result Value Ref Range   WBC 5.0 4.0 - 10.5 K/uL   RBC 5.13 4.22 - 5.81 MIL/uL   Hemoglobin 14.9 13.0 - 17.0 g/dL   HCT 47.0 39.0 - 52.0 %   MCV 91.6 80.0 - 100.0 fL   MCH 29.0 26.0 - 34.0 pg   MCHC 31.7 30.0 - 36.0 g/dL   RDW 14.8 11.5 - 15.5 %   Platelets 157 150 - 400 K/uL   nRBC 0.0 0.0 - 0.2 %  Neutrophils Relative % 54 %   Neutro Abs 2.7 1.7 - 7.7 K/uL   Lymphocytes Relative 27 %   Lymphs Abs 1.4 0.7 - 4.0 K/uL   Monocytes Relative 18 %   Monocytes Absolute 0.9 0.1 - 1.0 K/uL   Eosinophils Relative 1 %   Eosinophils Absolute 0.1 0.0 - 0.5 K/uL   Basophils Relative 0 %   Basophils Absolute 0.0 0.0 - 0.1 K/uL   Immature Granulocytes 0 %   Abs Immature Granulocytes 0.01 0.00 - 0.07 K/uL  Comprehensive metabolic panel  Result Value Ref Range   Sodium 137 135 - 145 mmol/L   Potassium 3.8 3.5 - 5.1 mmol/L   Chloride 105 98 - 111 mmol/L   CO2 26 22 - 32 mmol/L   Glucose, Bld 91 70 - 99 mg/dL   BUN 24 (H) 6 - 20 mg/dL   Creatinine, Ser 1.60 (H) 0.61 - 1.24 mg/dL   Calcium 8.6 (L) 8.9 - 10.3 mg/dL   Total Protein 6.8 6.5 - 8.1 g/dL   Albumin 3.5 3.5 - 5.0 g/dL   AST 23 15 - 41 U/L    ALT 21 0 - 44 U/L   Alkaline Phosphatase 90 38 - 126 U/L   Total Bilirubin 0.8 0.3 - 1.2 mg/dL   GFR, Estimated 49 (L) >60 mL/min   Anion gap 6 5 - 15  Lipase, blood  Result Value Ref Range   Lipase 41 11 - 51 U/L      Assessment & Plan:   Problem List Items Addressed This Visit   None Visit Diagnoses     Pain of toe of left foot    -  Primary   referral placed to podiatry   Relevant Orders   Ambulatory referral to Podiatry   Onychomycosis       referral placed to podiatry        Follow up plan: Return if symptoms worsen or fail to improve.

## 2022-08-18 NOTE — Telephone Encounter (Signed)
Reason for Disposition  [1] Red area or streak [2] large (> 2 in. or 5 cm)  Answer Assessment - Initial Assessment Questions 1. ONSET: "When did the swelling start?" (e.g., minutes, hours, days)     2 weeks 2. LOCATION: "What part of the leg is swollen?"  "Are both legs swollen or just one leg?"     Left leg- foot/ankle 3. SEVERITY: "How bad is the swelling?" (e.g., localized; mild, moderate, severe)   - Localized: Small area of swelling localized to one leg.   - MILD pedal edema: Swelling limited to foot and ankle, pitting edema < 1/4 inch (6 mm) deep, rest and elevation eliminate most or all swelling.   - MODERATE edema: Swelling of lower leg to knee, pitting edema > 1/4 inch (6 mm) deep, rest and elevation only partially reduce swelling.   - SEVERE edema: Swelling extends above knee, facial or hand swelling present.      *No Answer* 4. REDNESS: "Does the swelling look red or infected?"     Redness- blistered 5. PAIN: "Is the swelling painful to touch?" If Yes, ask: "How painful is it?"   (Scale 1-10; mild, moderate or severe)     yes 6. FEVER: "Do you have a fever?" If Yes, ask: "What is it, how was it measured, and when did it start?"      no 7. CAUSE: "What do you think is causing the leg swelling?"     *No Answer* 8. MEDICAL HISTORY: "Do you have a history of blood clots (e.g., DVT), cancer, heart failure, kidney disease, or liver failure?"     *No Answer* 9. RECURRENT SYMPTOM: "Have you had leg swelling before?" If Yes, ask: "When was the last time?" "What happened that time?"     Yes- been a while 10. OTHER SYMPTOMS: "Do you have any other symptoms?" (e.g., chest pain, difficulty breathing)       Great toe- nail infection 11. PREGNANCY: "Is there any chance you are pregnant?" "When was your last menstrual period?"       *No Answer*  Protocols used: Leg Swelling and Edema-A-AH

## 2022-08-18 NOTE — Telephone Encounter (Signed)
  Chief Complaint: foot/ankle swelling Symptoms: swelling in foot/ankle, possible infection in great toe Frequency: 2 weeks Pertinent Negatives: Patient denies fever, other symptoms Disposition: '[]'$ ED /'[]'$ Urgent Care (no appt availability in office) / '[x]'$ Appointment(In office/virtual)/ '[]'$  [facility]/ '[]'$ Home Care/ '[]'$ Refused Recommended Disposition /'[]'$  Mobile Bus/ '[]'$  Follow-up with PCP Additional Notes: Phone system failure- lost call. MyChart- message sent- patient notified of appointment with call back

## 2022-08-19 ENCOUNTER — Ambulatory Visit: Payer: BC Managed Care – PPO | Admitting: Podiatry

## 2022-08-19 DIAGNOSIS — Z79899 Other long term (current) drug therapy: Secondary | ICD-10-CM | POA: Diagnosis not present

## 2022-08-19 DIAGNOSIS — B351 Tinea unguium: Secondary | ICD-10-CM

## 2022-08-20 ENCOUNTER — Ambulatory Visit (INDEPENDENT_AMBULATORY_CARE_PROVIDER_SITE_OTHER): Payer: BC Managed Care – PPO | Admitting: Nurse Practitioner

## 2022-08-20 ENCOUNTER — Encounter (INDEPENDENT_AMBULATORY_CARE_PROVIDER_SITE_OTHER): Payer: Self-pay | Admitting: Nurse Practitioner

## 2022-08-20 VITALS — BP 130/76 | HR 90 | Resp 16 | Wt 241.6 lb

## 2022-08-20 DIAGNOSIS — I8312 Varicose 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: Secondary | ICD-10-CM | POA: Diagnosis not present

## 2022-08-20 LAB — HEPATIC FUNCTION PANEL
ALT: 14 IU/L (ref 0–44)
AST: 15 IU/L (ref 0–40)
Albumin: 4 g/dL (ref 3.8–4.9)
Alkaline Phosphatase: 94 IU/L (ref 44–121)
Bilirubin Total: 0.4 mg/dL (ref 0.0–1.2)
Bilirubin, Direct: 0.13 mg/dL (ref 0.00–0.40)
Total Protein: 6.4 g/dL (ref 6.0–8.5)

## 2022-08-20 MED ORDER — TERBINAFINE HCL 250 MG PO TABS: 250.0000 mg | ORAL_TABLET | Freq: Every day | ORAL | 0 refills | Status: DC

## 2022-08-20 NOTE — Progress Notes (Signed)
Varicose 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 (454.1  I83.10) Current Plans   Indication: Patient presents with symptomatic varicose veins of the left lower extremity.   Procedure: Sclerotherapy using hypertonic saline mixed with 1% Lidocaine was performed on the left lower extremity. Compression wraps were placed. The patient tolerated the procedure well. 

## 2022-08-21 ENCOUNTER — Ambulatory Visit: Payer: BC Managed Care – PPO | Admitting: Podiatry

## 2022-08-21 NOTE — Progress Notes (Signed)
Subjective:  Patient ID: Jonathon Snow, male    DOB: 07/12/1961,  MRN: [id number]  Chief Complaint  Patient presents with   Nail Problem    Left hallux     61 y.o. male presents with the above complaint.  Patient presents with complaint left hallux thickened elongated dystrophic mycotic toenails x1 mild pain on palpation.  Patient would like to have it evaluated he would like to discuss treatment options for it.  He has not seen anyone else prior to seeing me he denies any other acute complaints.  Hurts with ambulation sometimes but mostly looks discolored.   Review of Systems: Negative except as noted in the HPI. Denies N/V/F/Ch.  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   Overview:  superior sagittal sinus, left transverse sinus and cortical veins    COVID-19 virus infection 12/2020   Depression    DVT (deep venous thrombosis) (HCC)    Dyspnea    GERD (gastroesophageal reflux disease)    Heel spur, left 02/16/2019   Heel spur, right 02/16/2019   Herpes zoster infection of lumbar region 02/20/2020   Hyperlipidemia    Hypertension    Lupus ([place])    Lymphedema 10/07/2018   Morbid obesity ([place])    Opiate abuse, episodic ([place]) 02/26/2018   Osteoporosis    Pneumonia    PONV (postoperative nausea and vomiting)    Postphlebitic syndrome with ulcer, 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   Presence of IVC filter 03/22/2020   Removed   Pulmonary embolism (HCC)    Renal disorder    Stage III    Current Outpatient Medications:    terbinafine (LAMISIL) 250 MG tablet, Take 1 tablet (250 mg total) by mouth daily., Disp: 90 tablet, Rfl: 0   albuterol (VENTOLIN HFA) 108 (90 Base) MCG/ACT inhaler, Inhale 2 puffs into the lungs every 6 (six) hours as needed for wheezing or shortness of breath., Disp: 18 g, Rfl: 2   amLODipine (NORVASC) 5 MG tablet, Take 1 tablet (5 mg total) by mouth daily., Disp: 90 tablet, Rfl: 3   apixaban  (ELIQUIS) 5 MG TABS tablet, Take 1 tablet (5 mg total) by mouth 2 (two) times daily., Disp: 60 tablet, Rfl: 6   benzonatate (TESSALON) 100 MG capsule, Take 1-2 capsules (100-200 mg total) by mouth 3 (three) times daily as needed for cough. (Patient not taking: Reported on 08/18/2022), Disp: 30 capsule, Rfl: 1   cholecalciferol (VITAMIN D3) 25 MCG (1000 UNIT) tablet, Take 1,000 Units by mouth daily., Disp: , Rfl:    HYDROcodone-acetaminophen (NORCO) 10-325 MG tablet, SMARTSIG:1 Tablet(s) By Mouth Every 12 Hours PRN, Disp: , Rfl:    losartan (COZAAR) 100 MG tablet, Take 1 tablet (100 mg total) by mouth daily., Disp: 90 tablet, Rfl: 1   montelukast (SINGULAIR) 10 MG tablet, Take 1 tablet (10 mg total) by mouth at bedtime., Disp: 90 tablet, Rfl: 3   Multiple Vitamins-Minerals (MULTIVITAMIN WITH MINERALS) tablet, Take 1 tablet by mouth daily., Disp: , Rfl:    mycophenolate (CELLCEPT) 500 MG tablet, Take 1,000 mg by mouth 2 (two) times daily., Disp: , Rfl:    predniSONE (DELTASONE) 10 MG tablet, Take 10 mg by mouth daily with breakfast., Disp: , Rfl:    promethazine-dextromethorphan (PROMETHAZINE-DM) 6.25-15 MG/5ML syrup, Take 2.5-5 mLs by mouth 3 (three) times daily as needed for cough. (Patient not taking: Reported on 08/18/2022), Disp: 118 mL, Rfl: 1   rosuvastatin (CRESTOR) 20 MG  tablet, Take one pill every other night before bed., Disp: 45 tablet, Rfl: 1   sertraline (ZOLOFT) 25 MG tablet, Take 1 tablet (25 mg total) by mouth daily. (Patient not taking: Reported on 08/18/2022), Disp: 90 tablet, Rfl: 1   sildenafil (REVATIO) 20 MG tablet, 3-5 tablets as needed 30 min prior to intercourse, Disp: 30 tablet, Rfl: 11   traMADol (ULTRAM) 50 MG tablet, Take 1 tablet (50 mg total) by mouth every 6 (six) hours as needed. (Patient not taking: Reported on 08/18/2022), Disp: 20 tablet, Rfl: 0  Social History   Tobacco Use  Smoking Status Never  Smokeless Tobacco Never    Allergies  Allergen Reactions    Enalapril Other (See Comments)    Unknown reaction   Vicodin [Hydrocodone-Acetaminophen] Hives and Rash    Severe headaches (also) NAME BRAND ONLY PER PT CAN TAKE GENERIC   Objective:  There were no vitals filed for this visit. There is no height or weight on file to calculate BMI. Constitutional Well developed. Well nourished.  Vascular Dorsalis pedis pulses palpable bilaterally. Posterior tibial pulses palpable bilaterally. Capillary refill normal to all digits.  No cyanosis or clubbing noted. Pedal hair growth normal.  Neurologic Normal speech. Oriented to person, place, and time. Epicritic sensation to light touch grossly present bilaterally.  Dermatologic Nails thickened elongated dystrophic mycotic toenails x1 left hallux. Skin within the limits  Orthopedic: Normal joint ROM without pain or crepitus bilaterally. No visible deformities. No bony tenderness.   Radiographs: None Assessment:   1. Long-term use of high-risk medication   2. Nail fungus   3. Onychomycosis due to dermatophyte    Plan:  Patient was evaluated and treated and all questions answered.  Left hallux onychomycosis -Educated the patient on the etiology of onychomycosis and various treatment options associated with improving the fungal load.  I explained to the patient that there is 3 treatment options available to treat the onychomycosis including topical, p.o., laser treatment.  Patient elected to undergo p.o. options with Lamisil/terbinafine therapy.  In order for me to start the medication therapy, I explained to the patient the importance of evaluating the liver and obtaining the liver function test.  Once the liver function test comes back normal I will start him on 43-monthcourse of Lamisil therapy.  Patient understood all risk and would like to proceed with Lamisil therapy.  I have asked the patient to immediately stop the Lamisil therapy if she has any reactions to it and call the office or go to the  emergency room right away.  Patient states understanding   No follow-ups on file.

## 2022-09-01 ENCOUNTER — Other Ambulatory Visit (INDEPENDENT_AMBULATORY_CARE_PROVIDER_SITE_OTHER): Payer: Self-pay | Admitting: Nurse Practitioner

## 2022-09-01 DIAGNOSIS — I8312 Varicose 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: Secondary | ICD-10-CM

## 2022-09-02 ENCOUNTER — Ambulatory Visit (INDEPENDENT_AMBULATORY_CARE_PROVIDER_SITE_OTHER): Payer: BC Managed Care – PPO

## 2022-09-02 ENCOUNTER — Ambulatory Visit (INDEPENDENT_AMBULATORY_CARE_PROVIDER_SITE_OTHER): Payer: BC Managed Care – PPO | Admitting: Vascular Surgery

## 2022-09-02 ENCOUNTER — Encounter (INDEPENDENT_AMBULATORY_CARE_PROVIDER_SITE_OTHER): Payer: Self-pay | Admitting: Vascular Surgery

## 2022-09-02 VITALS — BP 114/77 | HR 79 | Resp 16 | Wt 239.4 lb

## 2022-09-02 DIAGNOSIS — N2581 Secondary hyperparathyroidism of renal origin: Secondary | ICD-10-CM | POA: Diagnosis not present

## 2022-09-02 DIAGNOSIS — I8312 Varicose 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: Secondary | ICD-10-CM

## 2022-09-02 DIAGNOSIS — I890 Lymphedema, not elsewhere classified: Secondary | ICD-10-CM

## 2022-09-02 DIAGNOSIS — I10 Essential (primary) hypertension: Secondary | ICD-10-CM | POA: Diagnosis not present

## 2022-09-02 DIAGNOSIS — E782 Mixed hyperlipidemia: Secondary | ICD-10-CM

## 2022-09-02 DIAGNOSIS — N1831 Chronic kidney disease, stage 3a: Secondary | ICD-10-CM

## 2022-09-02 DIAGNOSIS — M3214 Glomerular disease in systemic lupus erythematosus: Secondary | ICD-10-CM | POA: Diagnosis not present

## 2022-09-02 NOTE — Progress Notes (Deleted)
NEUROLOGY CONSULTATION NOTE  Jonathon Snow MRN: [id number] DOB: May 09, 1961  Referring provider: Delsa Grana, PA-C Primary care provider: Delsa Grana, PA-C  Reason for consult:  tremors, weakness, history of lupus  Assessment/Plan:   ***   Subjective:  Jonathon Snow is a 61 year old male with SLE on immunosuppressant therapy, COPD, CKD, IIIa, HTN, HLD, asthma and history of DVT, cerebral dural sinus thrombosis and PE on Eliquis who presents for tremors and weakness.  History supplemented by referring provider's note.  CTV of head personally reviewed.  Patient has complicated medical history.  He has SLE with CKD on CellCept and prednisone as well as other comorbidities listed above.  Experiencing continuous tremors ***.  TSH was 0.81.  Does not drink alcohol.  Medications of offending agents were reviewed with no causative agent identified.  Also generalized weakness.  Reports trouble with hand grip.  He had lumbar spine surgery but lower extremity strength is intact.  Also has been experiencing dysphagia.  Endorses increased dyspnea.  Followed by pulmonology for COPD, asthma and OSA.  Also endorses palpitations.  Followed by cardiology for CAD.  He was subsequently diagnosed with gonorrhea and started on doxycycline and Rocephin.  In May 2023, he began experiencing an occipital to frontal headache with photophobia.  He has history of cerebral venous sinus thrombosis (though still on chronic anticoagulation) so he went to the ED where CTV head was negative for abnormality such as thrombosis.  Ultimately, he was treated with a headache cocktail.       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   Overview:  superior sagittal sinus, left transverse sinus and cortical veins    COVID-19 virus infection 12/2020   Depression    DVT (deep venous thrombosis) (HCC)    Dyspnea    GERD  (gastroesophageal reflux disease)    Heel spur, left 02/16/2019   Heel spur, right 02/16/2019   Herpes zoster infection of lumbar region 02/20/2020   Hyperlipidemia    Hypertension    Lupus ([place])    Lymphedema 10/07/2018   Morbid obesity ([place])    Opiate abuse, episodic ([place]) 02/26/2018   Osteoporosis    Pneumonia    PONV (postoperative nausea and vomiting)    Postphlebitic syndrome with ulcer, 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/18/2016   Presence of IVC filter 03/22/2020   Removed   Pulmonary embolism ([place])    Renal disorder    Stage III    PAST SURGICAL HISTORY: Past Surgical History:  Procedure Laterality Date   ANKLE SURGERY Right    BACK SURGERY     BRONCHIAL WASHINGS N/A 11/01/2021   Procedure: BRONCHIAL WASHINGS;  Surgeon: [name], MD;  Location: [facility] ORS;  Service: Thoracic;  Laterality: N/A;   COLONOSCOPY WITH PROPOFOL N/A 05/28/2020   Procedure: COLONOSCOPY WITH PROPOFOL;  Surgeon: Jonathon Bellows, MD;  Location: [facility] ENDOSCOPY;  Service: Endoscopy;  Laterality: N/A;   CYST EXCISION  92 or 93    Liver cyst removal [facility]   FLEXIBLE BRONCHOSCOPY N/A 11/01/2021   Procedure: FLEXIBLE BRONCHOSCOPY;  Surgeon: [name], MD;  Location: [facility] ORS;  Service: Thoracic;  Laterality: N/A;   I & D EXTREMITY Right 04/29/2017   Procedure: IRRIGATION AND DEBRIDEMENT EXTREMITY;  Surgeon: Clayburn Pert, MD;  Location: [facility] ORS;  Service: General;  Laterality: Right;   IRRIGATION AND DEBRIDEMENT ABSCESS Left 04/29/2017   Procedure: IRRIGATION AND DEBRIDEMENT Scrotal ABSCESS;  Surgeon: Clayburn Pert, MD;  Location: [facility] ORS;  Service: General;  Laterality: Left;    MEDICATIONS: Current Outpatient Medications on File Prior to Visit  Medication Sig Dispense Refill   albuterol (VENTOLIN HFA) 108 (90 Base) MCG/ACT inhaler Inhale 2 puffs into the lungs every 6 (six) hours as needed for wheezing or shortness of breath. 18 g 2   amLODipine (NORVASC) 5 MG tablet Take 1 tablet (5 mg total) by mouth  daily. 90 tablet 3   apixaban (ELIQUIS) 5 MG TABS tablet Take 1 tablet (5 mg total) by mouth 2 (two) times daily. 60 tablet 6   benzonatate (TESSALON) 100 MG capsule Take 1-2 capsules (100-200 mg total) by mouth 3 (three) times daily as needed for cough. (Patient not taking: Reported on 08/18/2022) 30 capsule 1   cholecalciferol (VITAMIN D3) 25 MCG (1000 UNIT) tablet Take 1,000 Units by mouth daily.     HYDROcodone-acetaminophen (NORCO) 10-325 MG tablet SMARTSIG:1 Tablet(s) By Mouth Every 12 Hours PRN     losartan (COZAAR) 100 MG tablet Take 1 tablet (100 mg total) by mouth daily. 90 tablet 1   montelukast (SINGULAIR) 10 MG tablet Take 1 tablet (10 mg total) by mouth at bedtime. 90 tablet 3   Multiple Vitamins-Minerals (MULTIVITAMIN WITH MINERALS) tablet Take 1 tablet by mouth daily.     mycophenolate (CELLCEPT) 500 MG tablet Take 1,000 mg by mouth 2 (two) times daily.     predniSONE (DELTASONE) 10 MG tablet Take 10 mg by mouth daily with breakfast.     promethazine-dextromethorphan (PROMETHAZINE-DM) 6.25-15 MG/5ML syrup Take 2.5-5 mLs by mouth 3 (three) times daily as needed for cough. (Patient not taking: Reported on 08/18/2022) 118 mL 1   rosuvastatin (CRESTOR) 20 MG tablet Take one pill every other night before bed. 45 tablet 1   sertraline (ZOLOFT) 25 MG tablet Take 1 tablet (25 mg total) by mouth daily. (Patient not taking: Reported on 08/18/2022) 90 tablet 1   sildenafil (REVATIO) 20 MG tablet 3-5 tablets as needed 30 min prior to intercourse 30 tablet 11   terbinafine (LAMISIL) 250 MG tablet Take 1 tablet (250 mg total) by mouth daily. 90 tablet 0   traMADol (ULTRAM) 50 MG tablet Take 1 tablet (50 mg total) by mouth every 6 (six) hours as needed. (Patient not taking: Reported on 08/18/2022) 20 tablet 0   No current facility-administered medications on file prior to visit.    ALLERGIES: Allergies  Allergen Reactions   Enalapril Other (See Comments)    Unknown reaction   Vicodin  [Hydrocodone-Acetaminophen] Hives and Rash    Severe headaches (also) NAME BRAND ONLY PER PT CAN TAKE GENERIC    FAMILY HISTORY: Family History  Problem Relation Age of Onset   Hypertension Father    Heart disease Father    Clotting disorder Mother    Kidney disease Brother    Heart attack Maternal Grandmother    Heart attack Maternal Grandfather    Heart attack Paternal Grandfather     Objective:  *** General: No acute distress.  Patient appears well-groomed.   Head:  Normocephalic/atraumatic Eyes:  fundi examined but not visualized Neck: supple, no paraspinal tenderness, full range of motion Back: No paraspinal tenderness Heart: regular rate and rhythm Lungs: Clear to auscultation bilaterally. Vascular: No carotid bruits. Neurological Exam: Mental status: alert and oriented to person, place, and time, speech fluent and not dysarthric, language intact. Cranial nerves: CN I: not tested CN II: pupils equal, round and reactive to light, visual fields  intact CN III, IV, VI:  full range of motion, no nystagmus, no ptosis CN V: facial sensation intact. CN VII: upper and lower face symmetric CN VIII: hearing intact CN IX, X: gag intact, uvula midline CN XI: sternocleidomastoid and trapezius muscles intact CN XII: tongue midline Bulk & Tone: normal, no fasciculations. Motor:  muscle strength 5/5 throughout Sensation:  Pinprick, temperature and vibratory sensation intact. Deep Tendon Reflexes:  2+ throughout,  toes downgoing.   Finger to nose testing:  Without dysmetria.   Heel to shin:  Without dysmetria.   Gait:  Normal station and stride.  Romberg negative.    Thank you for allowing me to take part in the care of this patient.  Metta Clines, DO  CC: ***

## 2022-09-02 NOTE — Progress Notes (Signed)
MRN : [id number]  Jonathon Snow is a 61 y.o. (October 07, 1961) male who presents with chief complaint of No chief complaint on file. Jonathon Snow  History of Present Illness: Patient returns today in follow up of ***  Current Outpatient Medications  Medication Sig Dispense Refill   albuterol (VENTOLIN HFA) 108 (90 Base) MCG/ACT inhaler Inhale 2 puffs into the lungs every 6 (six) hours as needed for wheezing or shortness of breath. 18 g 2   amLODipine (NORVASC) 5 MG tablet Take 1 tablet (5 mg total) by mouth daily. 90 tablet 3   apixaban (ELIQUIS) 5 MG TABS tablet Take 1 tablet (5 mg total) by mouth 2 (two) times daily. 60 tablet 6   benzonatate (TESSALON) 100 MG capsule Take 1-2 capsules (100-200 mg total) by mouth 3 (three) times daily as needed for cough. (Patient not taking: Reported on 08/18/2022) 30 capsule 1   cholecalciferol (VITAMIN D3) 25 MCG (1000 UNIT) tablet Take 1,000 Units by mouth daily.     HYDROcodone-acetaminophen (NORCO) 10-325 MG tablet SMARTSIG:1 Tablet(s) By Mouth Every 12 Hours PRN     losartan (COZAAR) 100 MG tablet Take 1 tablet (100 mg total) by mouth daily. 90 tablet 1   montelukast (SINGULAIR) 10 MG tablet Take 1 tablet (10 mg total) by mouth at bedtime. 90 tablet 3   Multiple Vitamins-Minerals (MULTIVITAMIN WITH MINERALS) tablet Take 1 tablet by mouth daily.     mycophenolate (CELLCEPT) 500 MG tablet Take 1,000 mg by mouth 2 (two) times daily.     predniSONE (DELTASONE) 10 MG tablet Take 10 mg by mouth daily with breakfast.     promethazine-dextromethorphan (PROMETHAZINE-DM) 6.25-15 MG/5ML syrup Take 2.5-5 mLs by mouth 3 (three) times daily as needed for cough. (Patient not taking: Reported on 08/18/2022) 118 mL 1   rosuvastatin (CRESTOR) 20 MG tablet Take one pill every other night before bed. 45 tablet 1   sertraline (ZOLOFT) 25 MG tablet Take 1 tablet (25 mg total) by mouth daily. (Patient not taking: Reported on 08/18/2022) 90 tablet 1   sildenafil (REVATIO) 20 MG tablet  3-5 tablets as needed 30 min prior to intercourse 30 tablet 11   terbinafine (LAMISIL) 250 MG tablet Take 1 tablet (250 mg total) by mouth daily. 90 tablet 0   traMADol (ULTRAM) 50 MG tablet Take 1 tablet (50 mg total) by mouth every 6 (six) hours as needed. (Patient not taking: Reported on 08/18/2022) 20 tablet 0   No current facility-administered medications for this visit.    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   Overview:  superior sagittal sinus, left transverse sinus and cortical veins    COVID-19 virus infection 12/2020   Depression    DVT (deep venous thrombosis) (HCC)    Dyspnea    GERD (gastroesophageal reflux disease)    Heel spur, left 02/16/2019   Heel spur, right 02/16/2019   Herpes zoster infection of lumbar region 02/20/2020   Hyperlipidemia    Hypertension    Lupus ([place])    Lymphedema 10/07/2018   Morbid obesity ([place])    Opiate abuse, episodic ([place]) 02/26/2018   Osteoporosis    Pneumonia    PONV (postoperative nausea and vomiting)    Postphlebitic syndrome with ulcer, 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/18/2016   Presence of IVC filter 03/22/2020   Removed   Pulmonary embolism ([place])    Renal disorder    Stage III    Past Surgical  History:  Procedure Laterality Date   ANKLE SURGERY Right    BACK SURGERY     BRONCHIAL WASHINGS N/A 11/01/2021   Procedure: BRONCHIAL WASHINGS;  Surgeon: [name], MD;  Location: [facility] ORS;  Service: Thoracic;  Laterality: N/A;   COLONOSCOPY WITH PROPOFOL N/A 05/28/2020   Procedure: COLONOSCOPY WITH PROPOFOL;  Surgeon: Jonathon Bellows, MD;  Location: [facility] ENDOSCOPY;  Service: Endoscopy;  Laterality: N/A;   CYST EXCISION  92 or 93    Liver cyst removal [facility]   FLEXIBLE BRONCHOSCOPY N/A 11/01/2021   Procedure: FLEXIBLE BRONCHOSCOPY;  Surgeon: [name], MD;  Location: [facility] ORS;  Service: Thoracic;  Laterality: N/A;   I & D EXTREMITY Right 04/29/2017   Procedure:  IRRIGATION AND DEBRIDEMENT EXTREMITY;  Surgeon: Clayburn Pert, MD;  Location: [facility] ORS;  Service: General;  Laterality: Right;   IRRIGATION AND DEBRIDEMENT ABSCESS Left 04/29/2017   Procedure: IRRIGATION AND DEBRIDEMENT Scrotal ABSCESS;  Surgeon: Clayburn Pert, MD;  Location: [facility] ORS;  Service: General;  Laterality: Left;     Social History   Tobacco Use   Smoking status: Never   Smokeless tobacco: Never  Vaping Use   Vaping Use: Never used  Substance Use Topics   Alcohol use: No   Drug use: No    Comment: PT DENIES   ***    Family History  Problem Relation Age of Onset   Hypertension Father    Heart disease Father    Clotting disorder Mother    Kidney disease Brother    Heart attack Maternal Grandmother    Heart attack Maternal Grandfather    Heart attack Paternal Grandfather    ***  Allergies  Allergen Reactions   Enalapril Other (See Comments)    Unknown reaction   Vicodin [Hydrocodone-Acetaminophen] Hives and Rash    Severe headaches (also) NAME BRAND ONLY PER PT CAN TAKE GENERIC     REVIEW OF SYSTEMS (Negative unless checked)  Constitutional: _0 Weight loss  _1 Fever  _2 Chills Cardiac: _3 Chest pain   _4 Chest pressure   _5 Palpitations   _6 Shortness of breath when laying flat   _7 Shortness of breath at rest   _8 Shortness of breath with exertion. Vascular:  _9 Pain in legs with walking   _10 Pain in legs at rest   _11 Pain in legs when laying flat   _12 Claudication   _13 Pain in feet when walking  _14 Pain in feet at rest  _15 Pain in feet when laying flat   _16 History of DVT   _17 Phlebitis   _18 Swelling in legs   _19 Varicose veins   _20 Non-healing ulcers Pulmonary:   _21 Uses home oxygen   _22 Productive cough   _23 Hemoptysis   _24 Wheeze  _25 COPD   _26 Asthma Neurologic:  _27 Dizziness  _28 Blackouts   _29 Seizures   _30 History of stroke   _31 History of TIA  _32 Aphasia   _33 Temporary blindness   _34 Dysphagia   _35 Weakness or numbness in arms   _36 Weakness or numbness in legs Musculoskeletal:   _37 Arthritis   _38 Joint swelling   _39 Joint pain   _40 Low back pain Hematologic:  _41 Easy bruising  _42 Easy bleeding   _43 Hypercoagulable state   _44 Anemic   Gastrointestinal:  _45 Blood in stool   _46 Vomiting blood  _47 Gastroesophageal reflux/heartburn   _48 Abdominal pain Genitourinary:  _49 Chronic kidney disease   _50 Difficult urination  _51 Frequent urination  _52 Burning with urination   _53 Hematuria Skin:  _54 Rashes   _55 Ulcers   _56 Wounds Psychological:  _57 History of anxiety   _58  History of major depression.  Physical Examination  There were no vitals taken for this visit.  Gen:  WD/WN, NAD Head: [place]/AT, No temporalis wasting. Ear/Nose/Throat: Hearing grossly intact, nares w/o erythema or drainage Eyes: Conjunctiva clear. Sclera non-icteric Neck: Supple.  Trachea midline Pulmonary:  Good air movement, no use of accessory muscles.  Cardiac: RRR, no JVD Vascular: *** Vessel Right Left  Radial Palpable Palpable                          PT Palpable Palpable  DP Palpable Palpable   Gastrointestinal: soft, non-tender/non-distended. No guarding/reflex.  Musculoskeletal: M/S 5/5 throughout.  No deformity or atrophy. *** edema. Neurologic: Sensation grossly intact in extremities.  Symmetrical.  Speech is fluent.  Psychiatric: Judgment intact, Mood & affect appropriate for pt's clinical situation. Dermatologic: No rashes or ulcers noted.  No cellulitis or open wounds.      Labs Recent Results (from the past 2160 hour(s))  Hepatic Function Panel     Status: None   Collection Time: 08/19/22  3:55 PM  Result Value Ref Range   Total Protein 6.4 6.0 - 8.5 g/dL   Albumin 4.0 3.8 - 4.9 g/dL   Bilirubin Total 0.4 0.0 - 1.2 mg/dL   Bilirubin, Direct 0.13 0.00 - 0.40 mg/dL   Alkaline Phosphatase 94 44 - 121 IU/L   AST 15 0 - 40 IU/L   ALT 14 0 - 44 IU/L    Radiology No results found.  Assessment/Plan  No problem-specific Assessment & Plan notes found for this encounter.    Leotis Pain,  MD  09/02/2022 2:41 PM    This note was created with Dragon medical transcription system.  Any errors from dictation are purely unintentional

## 2022-09-03 NOTE — Assessment & Plan Note (Signed)
The patient has clearly developed lymphedema from chronic scarring and lymphatic channels and have stage II lymphedema with fixed skin changes and swelling refractory to compression, elevation, and anti-inflammatories.  At this point, the addition of a lymphedema pump would be an excellent adjuvant therapy to improve his symptoms of swelling and pain.  We will attempt to get that approved in the near future.  We will see him back in a few months to assess his response.

## 2022-09-03 NOTE — Assessment & Plan Note (Signed)
May worsen LE swelling

## 2022-09-03 NOTE — Assessment & Plan Note (Signed)
blood pressure control important in reducing the progression of atherosclerotic disease. On appropriate oral medications.  

## 2022-09-03 NOTE — Assessment & Plan Note (Signed)
He had a duplex done that shows persistent ablation of the left great saphenous vein with no significant venous reflux of concern and no DVT or superficial thrombophlebitis.  No further venous intervention is of benefit at this point.  The patient has clearly developed lymphedema from chronic scarring and lymphatic channels and have stage II lymphedema with fixed skin changes and swelling refractory to compression, elevation, and anti-inflammatories.  At this point, the addition of a lymphedema pump would be an excellent adjuvant therapy to improve his symptoms of swelling and pain.  We will attempt to get that approved in the near future.  We will see him back in a few months to assess his response.

## 2022-09-03 NOTE — Assessment & Plan Note (Signed)
lipid control important in reducing the progression of atherosclerotic disease. Continue statin therapy  

## 2022-09-04 ENCOUNTER — Ambulatory Visit: Payer: Self-pay | Admitting: Neurology

## 2022-09-05 ENCOUNTER — Ambulatory Visit (INDEPENDENT_AMBULATORY_CARE_PROVIDER_SITE_OTHER): Payer: 59 | Admitting: Nurse Practitioner

## 2022-09-05 ENCOUNTER — Ambulatory Visit: Payer: 59 | Admitting: Family Medicine

## 2022-09-09 ENCOUNTER — Ambulatory Visit (INDEPENDENT_AMBULATORY_CARE_PROVIDER_SITE_OTHER): Payer: BC Managed Care – PPO | Admitting: Family Medicine

## 2022-09-09 ENCOUNTER — Encounter: Payer: Self-pay | Admitting: Family Medicine

## 2022-09-09 VITALS — BP 114/76 | HR 89 | Temp 98.4°F | Resp 16 | Ht 74.5 in | Wt 239.5 lb

## 2022-09-09 DIAGNOSIS — R7303 Prediabetes: Secondary | ICD-10-CM

## 2022-09-09 DIAGNOSIS — I2510 Atherosclerotic heart disease of native coronary artery without angina pectoris: Secondary | ICD-10-CM | POA: Insufficient documentation

## 2022-09-09 DIAGNOSIS — Z7901 Long term (current) use of anticoagulants: Secondary | ICD-10-CM

## 2022-09-09 DIAGNOSIS — Z23 Encounter for immunization: Secondary | ICD-10-CM

## 2022-09-09 DIAGNOSIS — I10 Essential (primary) hypertension: Secondary | ICD-10-CM

## 2022-09-09 DIAGNOSIS — E785 Hyperlipidemia, unspecified: Secondary | ICD-10-CM

## 2022-09-09 DIAGNOSIS — E782 Mixed hyperlipidemia: Secondary | ICD-10-CM

## 2022-09-09 DIAGNOSIS — M329 Systemic lupus erythematosus, unspecified: Secondary | ICD-10-CM

## 2022-09-09 DIAGNOSIS — D699 Hemorrhagic condition, unspecified: Secondary | ICD-10-CM

## 2022-09-09 DIAGNOSIS — I890 Lymphedema, not elsewhere classified: Secondary | ICD-10-CM

## 2022-09-09 DIAGNOSIS — F321 Major depressive disorder, single episode, moderate: Secondary | ICD-10-CM | POA: Diagnosis not present

## 2022-09-09 DIAGNOSIS — N1831 Chronic kidney disease, stage 3a: Secondary | ICD-10-CM

## 2022-09-09 DIAGNOSIS — R42 Dizziness and giddiness: Secondary | ICD-10-CM

## 2022-09-09 DIAGNOSIS — R55 Syncope and collapse: Secondary | ICD-10-CM

## 2022-09-09 DIAGNOSIS — I25118 Atherosclerotic heart disease of native coronary artery with other forms of angina pectoris: Secondary | ICD-10-CM

## 2022-09-09 MED ORDER — ZOSTER VAC RECOMB ADJUVANTED 50 MCG/0.5ML IM SUSR
0.5000 mL | Freq: Once | INTRAMUSCULAR | 1 refills | Status: AC
Start: 2022-09-09 — End: 2022-09-09

## 2022-09-09 MED ORDER — SERTRALINE HCL 25 MG PO TABS: 25.0000 mg | ORAL_TABLET | Freq: Every day | ORAL | 3 refills | Status: DC

## 2022-09-09 MED ORDER — MECLIZINE HCL 25 MG PO TABS: 12.5000 mg | ORAL_TABLET | Freq: Three times a day (TID) | ORAL | 1 refills | Status: DC | PRN

## 2022-09-09 MED ORDER — ROSUVASTATIN CALCIUM 10 MG PO TABS: 10.0000 mg | ORAL_TABLET | Freq: Every day | ORAL | 3 refills | Status: DC

## 2022-09-09 NOTE — Patient Instructions (Signed)
Your symptoms may be from the pain medication you are taking.  It can also cause BP to be lower.  When you are taking pain meds for your back you may want to hold amlodipine or reduce the losartan - you can also call and ask Dr. Holley Raring what he may recommend.  Check your blood pressure when you are at home and you are having symptoms.  We may be able to have you see PT for evaluation and therapy for the dizziness.

## 2022-09-09 NOTE — Progress Notes (Unsigned)
Patient ID: Jonathon Snow, male    DOB: 05-17-1961, 61 y.o.   MRN: [id number]  PCP: Jonathon Grana, PA-C  Chief Complaint  Patient presents with   Follow-up   Hypertension   Hyperlipidemia   Depression    Subjective:   Jonathon Snow is a 61 y.o. male, presents to clinic with CC of the following:  Hypertension:  Currently managed on norvasc 5 mg and losartan 100 mg  Hx of kidney disease/lupus - should be seeing nephrology as well Pt reports good med compliance BP at goal today -blood pressure readings are lower than what they normally are but still in normal range BP Readings from Last 10 Encounters:  09/09/22 114/76  09/02/22 114/77  08/20/22 130/76  08/18/22 (!) 140/72  06/26/22 138/82  05/15/22 (!) 151/99  05/13/22 (!) 161/83  04/29/22 (!) 133/91  04/25/22 (!) 144/88  04/15/22 (!) 158/93  Pt denies CP, palpitation, visual disturbances, hypotension, syncope, no change from baseline SOB  HLD - he has continued to take crestor 20 every other night - due for labs today   Lab Results  Component Value Date   CHOL 139 09/18/2021   HDL 45 09/18/2021   LDLCALC 76 09/18/2021   TRIG 92 09/18/2021   CHOLHDL 3.1 09/18/2021   Lab Results  Component Value Date   CHOL 139 09/18/2021   CHOL 275 (H) 11/07/2020   CHOL 227 (H) 01/17/2020   Lab Results  Component Value Date   HDL 45 09/18/2021   HDL 55 11/07/2020   HDL 46 01/17/2020   Lab Results  Component Value Date   LDLCALC 76 09/18/2021   LDLCALC 196 (H) 11/07/2020   LDLCALC 159 (H) 01/17/2020   Lab Results  Component Value Date   TRIG 92 09/18/2021   TRIG 107 11/07/2020   TRIG 108 01/17/2020   Lab Results  Component Value Date   CHOLHDL 3.1 09/18/2021   CHOLHDL 5.0 (H) 11/07/2020   CHOLHDL 4.9 01/17/2020   No results found for: "LDLDIRECT"   CKD, SLE glomerulonephritis - managed by nephro and rheum  Last nephro appt last week - Jonathon Snow Impression/Recommendations  Jonathon Snow is a 61 y.o.  male with with past history of pulmonary embolus, history of IVC filter placement, right ankle fracture status post ORIF, and herniated disc status post L5-S1 fusion, who returns today in followup of lupus nephritis, proteinuria, CKD stage III, hypertension.  Problem List: 1. Lupus Nephritis, underwent induction with cellcept x 1.5 years. Relapse 01/2017, restarted on cellcept. 2. Proteinuria. 3. Hypertension. 4. Positive ANA with positive double-stranded DNA, anti-chromatin antibodies, SSA antibody, and RNP antibody. 5. Systemic lupus erythematosis. 6. Drug induced rash secondary to cytoxan. 7. History of anemia of CKD. 8. CKD stage IIIa with episode of acute renal failure 04/2017 requiring dialysis.  9. History of herpes zoster infection due to immunosuppression. 10. Right axillary and left groin abscess 04/2017. 11. Syncope 10/18/17. 12. Episode of unresponsiveness/respiratory failure/acute renal failure 02/2018 13. L5-S1 spinal fusion 06/17/21.  14. Secondary hyperparathyroidism.  Plan: The patient's lupus nephritis continues to be well treated with use of CellCept and prednisone. Therefore we will continue current doses of CellCept and prednisone. Previously when we tapered CellCept he had a significant relapse of his lupus. Therefore we will continue CellCept and prednisone. In regards to chronic kidney disease most recent EGFR was 37 with a albumin-to-creatinine ratio of 115. We will maintain the patient on losartan for renal protection and proteinuria suppression. He also  has associated secondary hyperparathyroidism most recent PTH of 108, phosphorus 4.9, calcium 9.1. No immediate need for calcitriol or phosphorus binders at the moment. We plan to repeat renal function, C3, C4, double-stranded DNA titer, CBC, and bone mineral metabolism parameters.  Return in about 4 months (around 01/02/2023).  Jonathon Lateef, MD   Coagulopathy, recurrent PE/DVT longterm anticoagulant use/eliquis -  trying again to change hematologist back to Jonathon Snow Last labs showed no anemia Hemoglobin  Date Value Ref Range Status  05/14/2022 14.9 13.0 - 17.0 g/dL Final  09/18/2021 13.2 13.2 - 17.1 g/dL Final  09/10/2021 11.8 (L) 13.0 - 17.0 g/dL Final  09/09/2021 13.6 13.0 - 17.0 g/dL Final   HGB  Date Value Ref Range Status  04/10/2015 10.4 (L) 13.0 - 18.0 g/dL Final  03/28/2015 9.8 (L) 13.0 - 18.0 g/dL Final  01/10/2015 10.5 (L) 13.0 - 18.0 g/dL Final  12/05/2014 9.4 (L) 13.0 - 18.0 g/dL Final   Pt recently f/up with vascular specialists - Dr. Lucky Snow for vein issues, had great saphenous ablation to left leg, developed lymphedema - they are working on getting pump to manage sx  Lab Results  Component Value Date   HGBA1C 5.7 (H) 09/18/2021     Tremor - still present, overall mild, he has not followed up with neurology yet because he has had more concerning health issues that he needed to do with first  Restarted meds about 6 months ago, on chart pt has 25 mg zoloft, overall mood is good, he does not want to stay on the 25 mg dose    09/09/2022    9:41 AM 08/18/2022   12:53 PM 06/26/2022    8:23 AM 04/09/2022    8:34 AM 03/04/2022   10:16 AM 02/25/2022    8:14 AM 02/03/2022    8:24 AM  Depression screen PHQ 2/9  Decreased Interest 0 0 0 0 1 1 1   Down, Depressed, Hopeless 0 0 0 1 0 1 1  PHQ - 2 Score 0 0 0 1 1 2 2   Altered sleeping 0 0 0 3 1 2 2   Tired, decreased energy 0 0 0 3 1 3 3   Change in appetite 0 0 0 3 0 1 3  Feeling bad or failure about yourself  0 0 0 0 0 0 0  Trouble concentrating 0 0 0 0 1 2 1   Moving slowly or fidgety/restless 0 0 0 0 0 1 1  Suicidal thoughts 0 0 0 0 0 0 0  PHQ-9 Score 0 0 0 10 4 11 12   Difficult doing work/chores Not difficult at all Not difficult at all Not difficult at all Extremely dIfficult Somewhat difficult  Very difficult   Patient also wishes to discuss acute issues I told him we do not have time to do an in depth acute office visit as well with his  complicated medical history multiple specialist and much of our time used up today He complains of just recently having dizzy episodes a few times in the past week or 2- happened for a whole day Saturday and he stayed in bed all day sat/sun.  He describes it as sometimes the room feels like it is moving or like he will move his balance other times it feels like he is going to blackout sometimes it is associated with headache or pressure.  It does not seem to be reproducible with head movements, rolling over in bed or standing up.  Sometimes it lasts for a few minutes  but he is also had 1 to 2 days of symptoms and overall feeling unwell.  He did check his blood pressure while having hours of symptoms and his blood pressure was 120-130 His had no palpitations, diaphoresis, shortness of breath, focal weakness numbness, slurred speech, facial droop He has been recently prescribed 10 mg Percocet to take twice a day he states that he does not take this every day and he has not noticed a correlation of his symptoms with when he takes the pain meds    Patient Active Problem List   Diagnosis Date Noted   Varicose 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 01/03/2022   Immunocompromised state due to drug therapy ([place]) 09/18/2021   Prediabetes 11/08/2020   Stage 3a chronic kidney disease ([place]) 11/08/2020   Seasonal allergies 11/08/2020   Rhinosinusitis 11/08/2020   Long term current use of systemic steroids 11/08/2020   Anticoagulant disorder ([place]) 11/07/2020   Senile purpura ([place]) 11/07/2020   History of DVT (deep vein thrombosis) 03/22/2020   MDD (major depressive disorder), recurrent episode, mild ([place]) 09/12/2019   Other spondylosis with radiculopathy, lumbar region 02/16/2019   Osteoporosis 10/12/2018   Chronic venous insufficiency 10/07/2018   Lymphedema 10/07/2018   SLE glomerulonephritis syndrome, 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[place]) 03/03/2018   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 ([place]) 10/29/2016   Mixed hyperlipidemia 01/15/2016   Benign essential hypertension 01/15/2016   Obesity (BMI 30.0-34.9) 01/15/2016   Long term current use of anticoagulant 10/04/2015   Systemic lupus erythematosus ([place]) 05/30/2015   COPD, moderate ([place]) 06/27/2014   History of pulmonary embolism 12/11/2013   Nodule of right lung 12/11/2013   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   Cystic disease of liver 08/21/2013      Current Outpatient Medications:    albuterol (VENTOLIN HFA) 108 (90 Base) MCG/ACT inhaler, Inhale 2 puffs into the lungs every 6 (six) hours as needed for wheezing or shortness of breath., Disp: 18 g, Rfl: 2   amLODipine (NORVASC) 5 MG tablet, Take 1 tablet (5 mg total) by mouth daily., Disp: 90 tablet, Rfl: 3   apixaban (ELIQUIS) 5 MG TABS tablet, Take 1 tablet (5 mg total) by mouth 2 (two) times daily., Disp: 60 tablet, Rfl: 6   cholecalciferol (VITAMIN D3) 25 MCG (1000 UNIT) tablet, Take 1,000 Units by mouth daily., Disp: , Rfl:    HYDROcodone-acetaminophen (NORCO) 10-325 MG tablet, SMARTSIG:1 Tablet(s) By Mouth Every 12 Hours PRN, Disp: , Rfl:    losartan (COZAAR) 100 MG tablet, Take 1 tablet (100 mg total) by mouth daily., Disp: 90 tablet, Rfl: 1   montelukast (SINGULAIR) 10 MG tablet, Take 1 tablet (10 mg total) by mouth at bedtime., Disp: 90 tablet, Rfl: 3   Multiple Vitamins-Minerals (MULTIVITAMIN WITH MINERALS) tablet, Take 1 tablet by mouth daily., Disp: , Rfl:    mycophenolate (CELLCEPT) 500 MG tablet, Take 1,000 mg by mouth 2 (two) times daily., Disp: , Rfl:    predniSONE (DELTASONE) 10 MG tablet, Take 10 mg by mouth daily with breakfast., Disp: , Rfl:    promethazine-dextromethorphan (PROMETHAZINE-DM) 6.25-15 MG/5ML syrup, Take 2.5-5 mLs by mouth 3 (three) times daily as needed for cough., Disp: 118 mL, Rfl: 1   rosuvastatin (CRESTOR) 20 MG tablet, Take one pill every other night before bed., Disp: 45 tablet, Rfl: 1    sertraline (ZOLOFT) 25 MG tablet, Take 1 tablet (25 mg total) by mouth daily., Disp: 90 tablet, Rfl: 1   sildenafil (  REVATIO) 20 MG tablet, 3-5 tablets as needed 30 min prior to intercourse, Disp: 30 tablet, Rfl: 11   benzonatate (TESSALON) 100 MG capsule, Take 1-2 capsules (100-200 mg total) by mouth 3 (three) times daily as needed for cough. (Patient not taking: Reported on 08/18/2022), Disp: 30 capsule, Rfl: 1   terbinafine (LAMISIL) 250 MG tablet, Take 1 tablet (250 mg total) by mouth daily. (Patient not taking: Reported on 09/09/2022), Disp: 90 tablet, Rfl: 0   traMADol (ULTRAM) 50 MG tablet, Take 1 tablet (50 mg total) by mouth every 6 (six) hours as needed. (Patient not taking: Reported on 08/18/2022), Disp: 20 tablet, Rfl: 0   Allergies  Allergen Reactions   Enalapril Other (See Comments)    Unknown reaction   Vicodin [Hydrocodone-Acetaminophen] Hives and Rash    Severe headaches (also) NAME BRAND ONLY PER PT CAN TAKE GENERIC     Social History   Tobacco Use   Smoking status: Never   Smokeless tobacco: Never  Vaping Use   Vaping Use: Never used  Substance Use Topics   Alcohol use: No   Drug use: No    Comment: PT DENIES      Chart Review Today: I personally reviewed active problem list, medication list, allergies, family history, social history, health maintenance, notes from last encounter, lab results, imaging with the patient/caregiver today. Extensive chart review of pertinent labs and OV notes - more than 10 min today   Review of Systems  Constitutional: Negative.   HENT: Negative.    Eyes: Negative.   Respiratory: Negative.    Cardiovascular: Negative.   Gastrointestinal: Negative.   Endocrine: Negative.   Genitourinary: Negative.   Musculoskeletal: Negative.   Skin: Negative.   Allergic/Immunologic: Negative.   Neurological:  Positive for dizziness, light-headedness and headaches. Negative for seizures, syncope and numbness.  Hematological: Negative.    Psychiatric/Behavioral: Negative.    All other systems reviewed and are negative.      Objective:   Vitals:   09/09/22 0942  BP: 114/76  Pulse: 89  Resp: 16  Temp: 98.4 F (36.9 C)  TempSrc: Oral  SpO2: 96%  Weight: 239 lb 8 oz (108.6 kg)  Height: 6' 2.5" (1.892 m)    Body mass index is 30.34 kg/m.  Physical Exam Vitals and nursing note reviewed.  Constitutional:      General: He is not in acute distress.    Appearance: Normal appearance. He is well-developed and well-groomed. He is obese. He is ill-appearing (chronically ill appearing, looking better than last 2 OV)). He is not toxic-appearing or diaphoretic.     Comments: He does appear uncomfortable  HENT:     Head: Normocephalic and atraumatic.     Right Ear: External ear normal.     Left Ear: External ear normal.     Mouth/Throat:     Mouth: Mucous membranes are moist.     Pharynx: Oropharynx is clear. No oropharyngeal exudate or posterior oropharyngeal erythema.     Comments: Uvula midline Eyes:     General: No scleral icterus.       Right eye: No discharge.        Left eye: No discharge.     Extraocular Movements: Extraocular movements intact.     Conjunctiva/sclera: Conjunctivae normal.     Pupils: Pupils are equal, round, and reactive to light.  Cardiovascular:     Rate and Rhythm: Normal rate and regular rhythm.     Pulses: Normal pulses.     Heart  sounds: Normal heart sounds.  Pulmonary:     Effort: Pulmonary effort is normal.     Breath sounds: Normal breath sounds.  Abdominal:     General: Bowel sounds are normal.     Palpations: Abdomen is soft.  Skin:    Coloration: Skin is not jaundiced.     Findings: No bruising or erythema.  Neurological:     Mental Status: He is alert. Mental status is at baseline.     Cranial Nerves: Cranial nerves 2-12 are intact. No cranial nerve deficit, dysarthria or facial asymmetry.     Sensory: Sensation is intact.     Motor: Tremor present.     Comments:  Symmetrical grip strength bilaterally Grossly normal sensation to light touch in all extremities  Psychiatric:        Mood and Affect: Mood normal.        Behavior: Behavior normal. Behavior is cooperative.      Orthostatic VS for the past 24 hrs:  BP- Lying Pulse- Lying BP- Sitting Pulse- Sitting BP- Standing at 0 minutes Pulse- Standing at 0 minutes  09/09/22 0942 150/82 85 140/84 87 142/78 94      Orthostatics reviewed and negative      Assessment & Plan:   Problem List Items Addressed This Visit       Cardiovascular and Mediastinum   Primary hypertension - Primary    Blood pressure a little softer than what is normally, discussed breaking his blood pressure medications in half or holding amlodipine if blood pressure goes below systolic 888 or when he is taking Percocet to avoid low BP      Relevant Medications   rosuvastatin (CRESTOR) 10 MG tablet   Coronary artery disease of native artery of native heart with stable angina pectoris ([place])    Medically managed, seeing cardiology      Relevant Medications   rosuvastatin (CRESTOR) 10 MG tablet   sertraline (ZOLOFT) 25 MG tablet     Genitourinary   Stage 3a chronic kidney disease (HCC)     Hematopoietic and Hemostatic   Anticoagulant disorder ([place])    Managed by vascular specialist and hematologist, reviewed recent visits and labs        Other   Systemic lupus erythematosus ([place])    Managed by rheumatology and nephrology on CellCept and steroids      Long term current use of anticoagulant   Mixed hyperlipidemia    He has been taking rosuvastatin 20 mg every other day, due for recheck of labs, he has tolerated this, discussed changing to Crestor 10 mg daily due to cost of meds the way they were prescribed before      Relevant Medications   rosuvastatin (CRESTOR) 10 MG tablet   Other Relevant Orders   Lipid panel (Completed)   Lymphedema   Prediabetes    Due for recheck labs were done 1 year ago       Relevant Orders   Hemoglobin A1c (Completed)   Current moderate episode of major depressive disorder ([place])    PHQ reviewed and negative, he would like to continue 25 mg Zoloft      Relevant Medications   sertraline (ZOLOFT) 25 MG tablet   Other Visit Diagnoses     Need for influenza vaccination       Relevant Orders   Flu Vaccine QUAD 76moIM (Fluarix, Fluzone & Alfiuria Quad PF) (Completed)   Need for shingles vaccine       Near syncope  Encouraged him to be careful with pain meds and monitor her blood pressure, orthostatics negative   Relevant Medications   rosuvastatin (CRESTOR) 10 MG tablet   Other Relevant Orders   Orthostatic vital signs   Dizziness       Patient will need to return for further eval of dizziness/near syncope unable to do full evaluation today no focal neuro deficit, orthostatics neg   Relevant Medications   meclizine (ANTIVERT) 25 MG tablet   Other Relevant Orders   Orthostatic vital signs      Patient was instructed to go to the ER if he is having any worsening dizziness or vertigo episodes or near syncope where he feels like he is going to pass out continuously for hours at a time he was given meclizine and we reviewed the dosing and side effects, he is welcome to try this for any dizziness or vertigo episodes but he was warned that it can be sedating he should not take with other sedating medications, and if it does not work and his symptoms are severe he was instructed to call 911 or go to the [facility]  We will try to refer him for physical therapy for further evaluation is unclear what is causing his symptoms I did ask him to return so we could do a visit dedicated to his acute symptoms   6 month routine f/up  Jonathon Grana, PA-C 09/09/22 9:49 AM

## 2022-09-10 LAB — LIPID PANEL
Cholesterol: 225 mg/dL — ABNORMAL HIGH (ref <200)
HDL: 56 mg/dL (ref >=40)
LDL Cholesterol (Calc): 150 mg/dL (calc) — ABNORMAL HIGH
Non-HDL Cholesterol (Calc): 169 mg/dL (calc) — ABNORMAL HIGH (ref <130)
Total CHOL/HDL Ratio: 4 (calc) (ref <5.0)
Triglycerides: 86 mg/dL (ref <150)

## 2022-09-10 LAB — HEMOGLOBIN A1C
Hgb A1c MFr Bld: 5.5 % of total Hgb (ref <5.7)
Mean Plasma Glucose: 111 mg/dL
eAG (mmol/L): 6.2 mmol/L

## 2022-09-10 NOTE — Assessment & Plan Note (Signed)
Medically managed, seeing cardiology

## 2022-09-10 NOTE — Assessment & Plan Note (Signed)
Managed by rheumatology and nephrology on CellCept and steroids

## 2022-09-10 NOTE — Assessment & Plan Note (Signed)
PHQ reviewed and negative, he would like to continue 25 mg Zoloft

## 2022-09-10 NOTE — Assessment & Plan Note (Signed)
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) WRITTEN ON 09/10/2022  1:28 PM BY Peace Jost, PA-C  PHQ reviewed and negative, he would like to continue 25 mg Zoloft

## 2022-09-10 NOTE — Assessment & Plan Note (Signed)
Due for recheck labs were done 1 year ago

## 2022-09-10 NOTE — Assessment & Plan Note (Signed)
Managed by vascular specialist and hematologist, reviewed recent visits and labs

## 2022-09-10 NOTE — Assessment & Plan Note (Signed)
He has been taking rosuvastatin 20 mg every other day, due for recheck of labs, he has tolerated this, discussed changing to Crestor 10 mg daily due to cost of meds the way they were prescribed before

## 2022-09-10 NOTE — Assessment & Plan Note (Signed)
Blood pressure a little softer than what is normally, discussed breaking his blood pressure medications in half or holding amlodipine if blood pressure goes below systolic 257 or when he is taking Percocet to avoid low BP

## 2022-10-01 ENCOUNTER — Encounter: Payer: Self-pay | Admitting: Internal Medicine

## 2022-10-01 ENCOUNTER — Inpatient Hospital Stay (HOSPITAL_BASED_OUTPATIENT_CLINIC_OR_DEPARTMENT_OTHER): Payer: BLUE CROSS/BLUE SHIELD | Admitting: Internal Medicine

## 2022-10-01 ENCOUNTER — Telehealth (INDEPENDENT_AMBULATORY_CARE_PROVIDER_SITE_OTHER): Payer: Self-pay | Admitting: Nurse Practitioner

## 2022-10-01 ENCOUNTER — Inpatient Hospital Stay: Payer: BLUE CROSS/BLUE SHIELD | Attending: Oncology

## 2022-10-01 DIAGNOSIS — Z79899 Other long term (current) drug therapy: Secondary | ICD-10-CM | POA: Diagnosis not present

## 2022-10-01 DIAGNOSIS — M3214 Glomerular disease in systemic lupus erythematosus: Secondary | ICD-10-CM | POA: Diagnosis not present

## 2022-10-01 DIAGNOSIS — Z79624 Long term (current) use of inhibitors of nucleotide synthesis: Secondary | ICD-10-CM | POA: Diagnosis not present

## 2022-10-01 DIAGNOSIS — Z7901 Long term (current) use of anticoagulants: Secondary | ICD-10-CM

## 2022-10-01 DIAGNOSIS — Z86711 Personal history of pulmonary embolism: Secondary | ICD-10-CM | POA: Diagnosis not present

## 2022-10-01 DIAGNOSIS — I825Y2 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: Secondary | ICD-10-CM

## 2022-10-01 DIAGNOSIS — Z86718 Personal history of other venous thrombosis and embolism: Secondary | ICD-10-CM | POA: Diagnosis not present

## 2022-10-01 DIAGNOSIS — Z7952 Long term (current) use of systemic steroids: Secondary | ICD-10-CM | POA: Insufficient documentation

## 2022-10-01 LAB — COMPREHENSIVE METABOLIC PANEL
ALT: 21 U/L (ref 0–44)
AST: 19 U/L (ref 15–41)
Albumin: 3.7 g/dL (ref 3.5–5.0)
Alkaline Phosphatase: 87 U/L (ref 38–126)
Anion gap: 6 (ref 5–15)
BUN: 24 mg/dL — ABNORMAL HIGH (ref 6–20)
CO2: 24 mmol/L (ref 22–32)
Calcium: 8.8 mg/dL — ABNORMAL LOW (ref 8.9–10.3)
Chloride: 107 mmol/L (ref 98–111)
Creatinine, Ser: 1.37 mg/dL — ABNORMAL HIGH (ref 0.61–1.24)
GFR, Estimated: 59 mL/min — ABNORMAL LOW (ref >60)
Glucose, Bld: 108 mg/dL — ABNORMAL HIGH (ref 70–99)
Potassium: 4.2 mmol/L (ref 3.5–5.1)
Sodium: 137 mmol/L (ref 135–145)
Total Bilirubin: 0.7 mg/dL (ref 0.3–1.2)
Total Protein: 7.2 g/dL (ref 6.5–8.1)

## 2022-10-01 LAB — CBC WITH DIFFERENTIAL/PLATELET
Abs Immature Granulocytes: 0.02 10*3/uL (ref 0.00–0.07)
Basophils Absolute: 0 10*3/uL (ref 0.0–0.1)
Basophils Relative: 0 %
Eosinophils Absolute: 0 10*3/uL (ref 0.0–0.5)
Eosinophils Relative: 1 %
HCT: 49.5 % (ref 39.0–52.0)
Hemoglobin: 16 g/dL (ref 13.0–17.0)
Immature Granulocytes: 0 %
Lymphocytes Relative: 7 %
Lymphs Abs: 0.4 10*3/uL — ABNORMAL LOW (ref 0.7–4.0)
MCH: 29 pg (ref 26.0–34.0)
MCHC: 32.3 g/dL (ref 30.0–36.0)
MCV: 89.8 fL (ref 80.0–100.0)
Monocytes Absolute: 0.4 10*3/uL (ref 0.1–1.0)
Monocytes Relative: 7 %
Neutro Abs: 4.6 10*3/uL (ref 1.7–7.7)
Neutrophils Relative %: 85 %
Platelets: 149 10*3/uL — ABNORMAL LOW (ref 150–400)
RBC: 5.51 MIL/uL (ref 4.22–5.81)
RDW: 14.6 % (ref 11.5–15.5)
WBC: 5.4 10*3/uL (ref 4.0–10.5)
nRBC: 0 % (ref 0.0–0.2)

## 2022-10-01 MED ORDER — APIXABAN 5 MG PO TABS: 5.0000 mg | ORAL_TABLET | Freq: Two times a day (BID) | ORAL | 6 refills | Status: DC

## 2022-10-01 NOTE — Assessment & Plan Note (Addendum)
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 Tolerating well.  Refill Eliquis.   # Mild chronic intermittent thrombocytopenia- ? Sec to immune process- monitor for now. OCT 2023- [[facility]]-platelets normal.  # History of nephritis/lupus/ CKD stage III-cellcept+prednisone following up with nephrology.  #DISPOSITION: #  follow up in 6 months- MD;/labs- cbc/cmp-Dr.B

## 2022-10-01 NOTE — Telephone Encounter (Signed)
LVM for pt asking for a return call to verify that the [place] that is active in his chart [facility]) is the only insurance that pt has. It is non participating and out of network for [place] at [facility]. If pt returns call, please verify that this is the only insurance he has and if he would like to proceed with sclerotherapy authorization.

## 2022-10-01 NOTE — Progress Notes (Signed)
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 SLE/Nephritis [cellcept/prednisolone;~creat 1.5; Dr.Lateef]  Oncology History   No history exists.     HISTORY OF PRESENTING ILLNESS: Alone.  Ambulating independently.  ABDALRAHMAN Snow 61 y.o.  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  Patient has not had any blood clots.  Denies any interruptions in his Eliquis.  States his lupus is medically well.  No blood in stools or black-colored stools.  Review of Systems  Constitutional:  Positive for malaise/fatigue. Negative for chills, diaphoresis, fever and weight loss.  HENT:  Negative for nosebleeds and sore throat.   Eyes:  Negative for double vision.  Respiratory:  Negative for cough, hemoptysis, sputum production, shortness of breath and wheezing.   Cardiovascular:  Positive for leg swelling. Negative for chest pain, palpitations and orthopnea.  Gastrointestinal:  Negative for abdominal pain, blood in stool, constipation, diarrhea, heartburn, melena, nausea and vomiting.  Genitourinary:  Negative for dysuria, frequency and urgency.  Musculoskeletal:  Negative for back pain and joint pain.  Skin:  Positive for rash.  Neurological:  Negative for dizziness,  tingling, focal weakness, weakness and headaches.  Endo/Heme/Allergies:  Does not bruise/bleed easily.  Psychiatric/Behavioral:  Negative for depression. The patient is not nervous/anxious and does not have insomnia.      MEDICAL HISTORY:  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   Overview:  superior sagittal sinus, left transverse sinus and cortical veins    COVID-19 virus infection 12/2020   Depression    DVT (deep venous thrombosis) (HCC)    Dyspnea    GERD (gastroesophageal reflux disease)    Heel spur, left 02/16/2019   Heel spur, right 02/16/2019   Herpes zoster infection of lumbar region 02/20/2020   Hyperlipidemia    Hypertension    Lupus ([place])    Lymphedema 10/07/2018   Morbid obesity ([place])    Opiate abuse, episodic ([place]) 02/26/2018   Osteoporosis    Pneumonia    PONV (postoperative nausea and vomiting)    Postphlebitic syndrome with ulcer, left ([place]) 11/18/2016   Presence of IVC filter 03/22/2020   Removed   Pulmonary embolism ([place])    Renal disorder    Stage III    SURGICAL HISTORY: Past Surgical History:  Procedure Laterality Date   ANKLE SURGERY Right    BACK SURGERY     BRONCHIAL WASHINGS N/A 11/01/2021   Procedure: BRONCHIAL WASHINGS;  Surgeon: [name], MD;  Location: [facility] ORS;  Service: Thoracic;  Laterality: N/A;   COLONOSCOPY WITH PROPOFOL N/A 05/28/2020   Procedure: COLONOSCOPY WITH PROPOFOL;  Surgeon: Jonathon Bellows, MD;  Location: [facility] ENDOSCOPY;  Service: Endoscopy;  Laterality: N/A;   CYST EXCISION  92 or 93    Liver cyst removal [facility]   FLEXIBLE BRONCHOSCOPY N/A 11/01/2021  Procedure: FLEXIBLE BRONCHOSCOPY;  Surgeon: [name], MD;  Location: [facility] ORS;  Service: Thoracic;  Laterality: N/A;   I & D EXTREMITY Right 04/29/2017   Procedure: IRRIGATION AND DEBRIDEMENT EXTREMITY;  Surgeon: Clayburn Pert, MD;  Location: [facility] ORS;  Service: General;  Laterality:  Right;   IRRIGATION AND DEBRIDEMENT ABSCESS Left 04/29/2017   Procedure: IRRIGATION AND DEBRIDEMENT Scrotal ABSCESS;  Surgeon: Clayburn Pert, MD;  Location: [facility] ORS;  Service: General;  Laterality: Left;    SOCIAL HISTORY: Social History   Socioeconomic History   Marital status: Divorced    Spouse name: Not on file   Number of children: Not on file   Years of education: Not on file   Highest education level: Not on file  Occupational History   Not on file  Tobacco Use   Smoking status: Never   Smokeless tobacco: Never  Vaping Use   Vaping Use: Never used  Substance and Sexual Activity   Alcohol use: No   Drug use: No    Comment: PT DENIES   Sexual activity: Yes  Other Topics Concern   Not on file  Social History Narrative   Not on file   Social Determinants of Health   Financial Resource Strain: Not on file  Food Insecurity: Not on file  Transportation Needs: Not on file  Physical Activity: Not on file  Stress: Not on file  Social Connections: Not on file  Intimate Partner Violence: Not on file    FAMILY HISTORY: Family History  Problem Relation Age of Onset   Hypertension Father    Heart disease Father    Clotting disorder Mother    Kidney disease Brother    Heart attack Maternal Grandmother    Heart attack Maternal Grandfather    Heart attack Paternal Grandfather     ALLERGIES:  is allergic to enalapril and vicodin [hydrocodone-acetaminophen].  MEDICATIONS:  Current Outpatient Medications  Medication Sig Dispense Refill   albuterol (VENTOLIN HFA) 108 (90 Base) MCG/ACT inhaler Inhale 2 puffs into the lungs every 6 (six) hours as needed for wheezing or shortness of breath. 18 g 2   amLODipine (NORVASC) 5 MG tablet Take 1 tablet (5 mg total) by mouth daily. 90 tablet 3   cholecalciferol (VITAMIN D3) 25 MCG (1000 UNIT) tablet Take 1,000 Units by mouth daily.     losartan (COZAAR) 100 MG tablet Take 1 tablet (100 mg total) by mouth daily. 90 tablet 1    meclizine (ANTIVERT) 25 MG tablet Take 0.5-1 tablets (12.5-25 mg total) by mouth 3 (three) times daily as needed for dizziness. 30 tablet 1   montelukast (SINGULAIR) 10 MG tablet Take 1 tablet (10 mg total) by mouth at bedtime. 90 tablet 3   Multiple Vitamins-Minerals (MULTIVITAMIN WITH MINERALS) tablet Take 1 tablet by mouth daily.     mycophenolate (CELLCEPT) 500 MG tablet Take 1,000 mg by mouth 2 (two) times daily.     predniSONE (DELTASONE) 10 MG tablet Take 10 mg by mouth daily with breakfast.     rosuvastatin (CRESTOR) 10 MG tablet Take 1 tablet (10 mg total) by mouth daily. 90 tablet 3   sertraline (ZOLOFT) 25 MG tablet Take 1 tablet (25 mg total) by mouth daily. 90 tablet 3   sildenafil (REVATIO) 20 MG tablet 3-5 tablets as needed 30 min prior to intercourse 30 tablet 11   apixaban (ELIQUIS) 5 MG TABS tablet Take 1 tablet (5 mg total) by mouth 2 (two) times daily. 60 tablet 6   HYDROcodone-acetaminophen (  NORCO) 10-325 MG tablet SMARTSIG:1 Tablet(s) By Mouth Every 12 Hours PRN (Patient not taking: Reported on 10/01/2022)     promethazine-dextromethorphan (PROMETHAZINE-DM) 6.25-15 MG/5ML syrup Take 2.5-5 mLs by mouth 3 (three) times daily as needed for cough. (Patient not taking: Reported on 10/01/2022) 118 mL 1   No current facility-administered medications for this visit.       PHYSICAL EXAMINATION:  Vitals:   10/01/22 1103  BP: (!) 138/91  Pulse: 78  Resp: 16  Temp: 98.1 F (36.7 C)  SpO2: 97%   Filed Weights   10/01/22 1103  Weight: 243 lb (110.2 kg)    Physical Exam Vitals and nursing note reviewed.  HENT:     Head: Normocephalic and atraumatic.     Mouth/Throat:     Pharynx: Oropharynx is clear.  Eyes:     Extraocular Movements: Extraocular movements intact.     Pupils: Pupils are equal, round, and reactive to light.  Cardiovascular:     Rate and Rhythm: Normal rate and regular rhythm.  Pulmonary:     Comments: Decreased breath sounds bilaterally.  Abdominal:      Palpations: Abdomen is soft.  Musculoskeletal:        General: Normal range of motion.     Cervical back: Normal range of motion.  Skin:    General: Skin is warm.  Neurological:     General: No focal deficit present.     Mental Status: He is alert and oriented to person, place, and time.  Psychiatric:        Behavior: Behavior normal.        Judgment: Judgment normal.      LABORATORY DATA:  I have reviewed the data as listed Lab Results  Component Value Date   WBC 5.4 10/01/2022   HGB 16.0 10/01/2022   HCT 49.5 10/01/2022   MCV 89.8 10/01/2022   PLT 149 (L) 10/01/2022   Recent Labs    05/14/22 2256 08/19/22 1555 10/01/22 1031  NA 137  --  137  K 3.8  --  4.2  CL 105  --  107  CO2 26  --  24  GLUCOSE 91  --  108*  BUN 24*  --  24*  CREATININE 1.60*  --  1.37*  CALCIUM 8.6*  --  8.8*  GFRNONAA 49*  --  59*  PROT 6.8 6.4 7.2  ALBUMIN 3.5 4.0 3.7  AST _0 ALT _1 ALKPHOS 90 94 87  BILITOT 0.8 0.4 0.7  BILIDIR  --  0.13  --     RADIOGRAPHIC STUDIES: I have personally reviewed the radiological images as listed and agreed with the findings in the report. VAS [place] LOWER EXTREMITY VENOUS (DVT)  Result Date: 09/09/2022  Lower Venous DVT Study Patient Name:  Jonathon Snow  Date of Exam:   09/02/2022 Medical Rec #: [id number]       Accession #:    [id number] Date of Birth: 07-17-1961      Patient Gender: M Patient Age:   60 years Exam Location:  [facility] Procedure:      VAS [place] LOWER EXTREMITY VENOUS (DVT) Referring Phys: Eulogio Ditch --------------------------------------------------------------------------------  Indications: Swelling, and Pain.  Performing Technologist: Almira Coaster RVS  Examination Guidelines: A complete evaluation includes B-mode imaging, spectral Doppler, color Doppler, and power Doppler as needed of all accessible portions of each vessel. Bilateral testing is considered an integral part of a complete examination. Limited  examinations  for reoccurring indications may be performed as noted. The reflux portion of the exam is performed with the patient in reverse Trendelenburg.  +-----+---------------+---------+-----------+----------+--------------+ RIGHTCompressibilityPhasicitySpontaneityPropertiesThrombus Aging +-----+---------------+---------+-----------+----------+--------------+ CFV  Full           Yes      Yes                                 +-----+---------------+---------+-----------+----------+--------------+   +---------+---------------+---------+-----------+----------+--------------+ LEFT     CompressibilityPhasicitySpontaneityPropertiesThrombus Aging +---------+---------------+---------+-----------+----------+--------------+ CFV      Full           Yes      Yes                                 +---------+---------------+---------+-----------+----------+--------------+ SFJ      Full           Yes      Yes                                 +---------+---------------+---------+-----------+----------+--------------+ FV Prox  Full           Yes      No                                  +---------+---------------+---------+-----------+----------+--------------+ FV Mid   Full           Yes      Yes                                 +---------+---------------+---------+-----------+----------+--------------+ FV DistalFull           Yes      Yes                                 +---------+---------------+---------+-----------+----------+--------------+ PFV      Full           Yes      Yes                                 +---------+---------------+---------+-----------+----------+--------------+ POP      Full           Yes      Yes                                 +---------+---------------+---------+-----------+----------+--------------+ PTV      Full           Yes      Yes                                  +---------+---------------+---------+-----------+----------+--------------+ PERO     Full           Yes      Yes                                 +---------+---------------+---------+-----------+----------+--------------+ GSV  Full           Yes      Yes                                 +---------+---------------+---------+-----------+----------+--------------+ SSV      Full           Yes      Yes                                 +---------+---------------+---------+-----------+----------+--------------+     Summary: RIGHT: - No evidence of common femoral vein obstruction.  LEFT: - There is no evidence of deep vein thrombosis in the lower extremity. - There is no evidence of deep vein thrombosis in the lower extremity. However, portions of this examination were limited- see technologist comments above. - There is no evidence of chronic venous insufficiency. - There is no evidence of superficial venous thrombosis.  *See table(s) above for measurements and observations. Electronically signed by Leotis Pain MD on 09/09/2022 at 1:12:23 PM.    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 Tolerating well.  Refill Eliquis.    # Mild chronic intermittent thrombocytopenia- ? Sec to immune process- monitor for now. OCT 2023- [[facility]]-platelets normal.   # History of nephritis/lupus/ CKD stage III-cellcept+prednisone following up with nephrology.  #DISPOSITION: #  follow up in 6 months- MD;/labs- [facility]-Dr.B         All questions were answered. The patient knows to call the clinic with any problems, questions or concerns.       Cammie Sickle, MD 10/01/2022 12:53 PM

## 2022-10-01 NOTE — Progress Notes (Signed)
Pt in for follow up, shortness of breath on minimal exertion.  Pt denies any concerns today.

## 2022-10-20 ENCOUNTER — Ambulatory Visit: Payer: 59 | Admitting: Dermatology

## 2022-10-20 ENCOUNTER — Encounter (INDEPENDENT_AMBULATORY_CARE_PROVIDER_SITE_OTHER): Payer: Self-pay

## 2022-10-29 ENCOUNTER — Telehealth: Payer: Self-pay | Admitting: Family Medicine

## 2022-10-29 ENCOUNTER — Encounter: Payer: Self-pay | Admitting: Nurse Practitioner

## 2022-10-29 ENCOUNTER — Ambulatory Visit: Payer: Self-pay | Admitting: *Deleted

## 2022-10-29 ENCOUNTER — Telehealth (INDEPENDENT_AMBULATORY_CARE_PROVIDER_SITE_OTHER): Payer: Self-pay | Admitting: Nurse Practitioner

## 2022-10-29 ENCOUNTER — Other Ambulatory Visit: Payer: Self-pay

## 2022-10-29 DIAGNOSIS — J449 Chronic obstructive pulmonary disease, unspecified: Secondary | ICD-10-CM

## 2022-10-29 DIAGNOSIS — U071 COVID-19: Secondary | ICD-10-CM

## 2022-10-29 MED ORDER — PROMETHAZINE-DM 6.25-15 MG/5ML PO SYRP
5.0000 mL | ORAL_SOLUTION | Freq: Four times a day (QID) | ORAL | 0 refills | Status: DC | PRN
Start: 2022-10-29 — End: 2022-12-10

## 2022-10-29 MED ORDER — MOLNUPIRAVIR EUA 200MG CAPSULE: 4.0000 | ORAL_CAPSULE | Freq: Two times a day (BID) | ORAL | 0 refills | Status: AC

## 2022-10-29 MED ORDER — ALBUTEROL SULFATE HFA 108 (90 BASE) MCG/ACT IN AERS: 2.0000 | INHALATION_SPRAY | Freq: Four times a day (QID) | RESPIRATORY_TRACT | 2 refills | Status: DC | PRN

## 2022-10-29 NOTE — Telephone Encounter (Signed)
  Chief Complaint: requesting appt. Did not want to review sx  Symptoms: cough brownish colored sputum at times. Stuffy nose difficulty breathing through nose. Shortness of breath with exertion. Has not tested for covid . Recommended to do at home test for covid prior to My Chart VV.  Frequency: sx started yesterday  Pertinent Negatives: Patient denies na . Patient did not want to review sx . Sounded annoyed when asked sx by NT. Disposition: '[]'$ ED /'[]'$ Urgent Care (no appt availability in office) / '[x]'$ Appointment(In office/virtual)/ '[]'$  [facility]/ '[]'$ Home Care/ '[]'$ Refused Recommended Disposition /'[]'$ [facility] [facility]/ '[]'$  Follow-up with PCP Additional Notes:    My Chart VV scheduled for today . Recommended if sx worsen go to UC/ED.    Reason for Disposition  [1] MILD difficulty breathing (e.g., minimal/no SOB at rest, SOB with walking, pulse <100) AND [2] still present when not coughing    4- 6 hours  Answer Assessment - Initial Assessment Questions 1. ONSET: "When did the cough begin?"      Yesterday . Patient did not want to review sx only wanted appt. 2. SEVERITY: "How bad is the cough today?"      Na  3. SPUTUM: "Describe the color of your sputum" (none, dry cough; clear, white, yellow, green)     Brownish in color 4. HEMOPTYSIS: "Are you coughing up any blood?" If so ask: "How much?" (flecks, streaks, tablespoons, etc.)     na 5. DIFFICULTY BREATHING: "Are you having difficulty breathing?" If Yes, ask: "How bad is it?" (e.g., mild, moderate, severe)    - MILD: No SOB at rest, mild SOB with walking, speaks normally in sentences, can lie down, no retractions, pulse < 100.    - MODERATE: SOB at rest, SOB with minimal exertion and prefers to sit, cannot lie down flat, speaks in phrases, mild retractions, audible wheezing, pulse 100-120.    - SEVERE: Very SOB at rest, speaks in single words, struggling to breathe, sitting hunched forward, retractions, pulse > 120      Shortness  of breath with exertion 6. FEVER: "Do you have a fever?" If Yes, ask: "What is your temperature, how was it measured, and when did it start?"     na 7. CARDIAC HISTORY: "Do you have any history of heart disease?" (e.g., heart attack, congestive heart failure)      na 8. LUNG HISTORY: "Do you have any history of lung disease?"  (e.g., pulmonary embolus, asthma, emphysema)     na 9. PE RISK FACTORS: "Do you have a history of blood clots?" (or: recent major surgery, recent prolonged travel, bedridden)     na 10. OTHER SYMPTOMS: "Do you have any other symptoms?" (e.g., runny nose, wheezing, chest pain)       Stuffy nose difficulty breathing through nose, cough small amount brownish colored sputum at times. Shortness of breathe with exertion 11. PREGNANCY: "Is there any chance you are pregnant?" "When was your last menstrual period?"       na 12. TRAVEL: "Have you traveled out of the country in the last month?" (e.g., travel history, exposures)       na  Protocols used: Cough - Acute Productive-A-AH

## 2022-10-29 NOTE — Assessment & Plan Note (Signed)
Patient denies any shortness of breath at this time. However, patient states he is out of his albuterol inhaler and needs a refill. Refill sent

## 2022-10-29 NOTE — Progress Notes (Signed)
Name: Jonathon Snow   MRN: [id number]    DOB: [birth year]   Date:10/29/2022       Progress Note  Subjective  Chief Complaint  Chief Complaint  Patient presents with   Cough    Congestion, sinus pressure, weak, test positive for covid   Covid Positive    I connected with  Lucienne Minks  on 10/29/22 at 11:00 AM EST by a video enabled telemedicine application and verified that I am speaking with the correct person using two identifiers.  I discussed the limitations of evaluation and management by telemedicine and the availability of in person appointments. The patient expressed understanding and agreed to proceed with a virtual visit  Staff also discussed with the patient that there may be a patient responsible charge related to this service. Patient Location: home Provider Location: [facility] Additional Individuals present: alone  HPI  Covid 19:  patient reports symptom started yesterday,  symptoms include fatigue, body aches, nasal congestion, and cough. He denies any fever or shortness of breath. Patient states he has not taken anything for his symptoms yet.  Recommend taking zyrge, flonase and mucinex.  May also take vitamin d, c and zinc.  Patient is currently on cellcept and is high risk. Recommend antiviral treatment.  Patient is also on eliquis so will avoid paxlovid but can do molnupiravir.  Discussed side effects and administration patient would like to take treatment. Patient reports that tessalon perls do not help him. Will send in phenergan-DM.    COPD: currently takes albuterol for his copd.  Patient reports he does need a refill. Patient also reports he takes steroids daily.  He denies any shortness of breath at this time.   Patient Active Problem List   Diagnosis Date Noted   Coronary artery disease of native artery of native heart with stable angina pectoris ([place]) 09/09/2022   Current moderate episode of major depressive disorder ([place]) 09/09/2022   Varicose veins of left  lower extremity with inflammation 01/03/2022   Immunocompromised state due to drug therapy ([place]) 09/18/2021   Prediabetes 11/08/2020   Stage 3a chronic kidney disease ([place]) 11/08/2020   Seasonal allergies 11/08/2020   Rhinosinusitis 11/08/2020   Long term current use of systemic steroids 11/08/2020   Anticoagulant disorder ([place]) 11/07/2020   Senile purpura ([place]) 11/07/2020   History of DVT (deep vein thrombosis) 03/22/2020   MDD (major depressive disorder), recurrent episode, mild ([place]) 09/12/2019   Other spondylosis with radiculopathy, lumbar region 02/16/2019   Osteoporosis 10/12/2018   Chronic venous insufficiency 10/07/2018   Lymphedema 10/07/2018   SLE glomerulonephritis syndrome, 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[place]) 03/03/2018   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 ([place]) 10/29/2016   Mixed hyperlipidemia 01/15/2016   Primary hypertension 01/15/2016   Obesity (BMI 30.0-34.9) 01/15/2016   Long term current use of anticoagulant 10/04/2015   Systemic lupus erythematosus ([place]) 05/30/2015   COPD, moderate ([place]) 06/27/2014   History of pulmonary embolism 12/11/2013   Nodule of right lung 12/11/2013   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   Cystic disease of liver 08/21/2013    Social History   Tobacco Use   Smoking status: Never   Smokeless tobacco: Never  Substance Use Topics   Alcohol use: No     Current Outpatient Medications:    amLODipine (NORVASC) 5 MG tablet, Take 1 tablet (5 mg total) by mouth daily., Disp: 90 tablet, Rfl: 3   apixaban (ELIQUIS) 5 MG TABS tablet, Take  1 tablet (5 mg total) by mouth 2 (two) times daily., Disp: 60 tablet, Rfl: 6   cholecalciferol (VITAMIN D3) 25 MCG (1000 UNIT) tablet, Take 1,000 Units by mouth daily., Disp: , Rfl:    losartan (COZAAR) 100 MG tablet, Take 1 tablet (100 mg total) by mouth daily., Disp: 90 tablet, Rfl: 1   meclizine (ANTIVERT) 25 MG tablet, Take 0.5-1 tablets (12.5-25 mg total)  by mouth 3 (three) times daily as needed for dizziness., Disp: 30 tablet, Rfl: 1   molnupiravir EUA (LAGEVRIO) 200 mg CAPS capsule, Take 4 capsules (800 mg total) by mouth 2 (two) times daily for 5 days., Disp: 40 capsule, Rfl: 0   montelukast (SINGULAIR) 10 MG tablet, Take 1 tablet (10 mg total) by mouth at bedtime., Disp: 90 tablet, Rfl: 3   Multiple Vitamins-Minerals (MULTIVITAMIN WITH MINERALS) tablet, Take 1 tablet by mouth daily., Disp: , Rfl:    mycophenolate (CELLCEPT) 500 MG tablet, Take 1,000 mg by mouth 2 (two) times daily., Disp: , Rfl:    promethazine-dextromethorphan (PROMETHAZINE-DM) 6.25-15 MG/5ML syrup, Take 5 mLs by mouth 4 (four) times daily as needed for cough., Disp: 118 mL, Rfl: 0   rosuvastatin (CRESTOR) 10 MG tablet, Take 1 tablet (10 mg total) by mouth daily., Disp: 90 tablet, Rfl: 3   sertraline (ZOLOFT) 25 MG tablet, Take 1 tablet (25 mg total) by mouth daily., Disp: 90 tablet, Rfl: 3   sildenafil (REVATIO) 20 MG tablet, 3-5 tablets as needed 30 min prior to intercourse, Disp: 30 tablet, Rfl: 11   albuterol (VENTOLIN HFA) 108 (90 Base) MCG/ACT inhaler, Inhale 2 puffs into the lungs every 6 (six) hours as needed for wheezing or shortness of breath., Disp: 18 g, Rfl: 2  Allergies  Allergen Reactions   Enalapril Other (See Comments)    Unknown reaction   Vicodin [Hydrocodone-Acetaminophen] Hives and Rash    Severe headaches (also) NAME BRAND ONLY PER PT CAN TAKE GENERIC    I personally reviewed active problem list, medication list, allergies with the patient/caregiver today.  ROS  Constitutional: Negative for fever or weight change.  HEENT: positive for nasal congestion Respiratory:positive  for cough and negative for shortness of breath.   Cardiovascular: Negative for chest pain or palpitations.  Gastrointestinal: Negative for abdominal pain, no bowel changes.  Musculoskeletal: Negative for gait problem or joint swelling.  Skin: Negative for rash.  Neurological:  Negative for dizziness or headache.  No other specific complaints in a complete review of systems (except as listed in HPI above).   Objective  Virtual encounter, vitals not obtained.  There is no height or weight on file to calculate BMI.  Nursing Note and Vital Signs reviewed.  Physical Exam  Awake, alert and oriented, speaking in complete sentences  No results found for this or any previous visit (from the past 72 hour(s)).  Assessment & Plan  Problem List Items Addressed This Visit       Respiratory   COPD, moderate ([place])    Patient denies any shortness of breath at this time. However, patient states he is out of his albuterol inhaler and needs a refill. Refill sent      Relevant Medications   promethazine-dextromethorphan (PROMETHAZINE-DM) 6.25-15 MG/5ML syrup   albuterol (VENTOLIN HFA) 108 (90 Base) MCG/ACT inhaler   Other Visit Diagnoses     COVID-19    -  Primary   push fluids, get rest, start taking molnupiravir, zyrtec, flonase and mucinex.  Can take vitamin d, c and zinc.  Sent in prescription for phenergan-dm.   Relevant Medications   molnupiravir EUA (LAGEVRIO) 200 mg CAPS capsule   promethazine-dextromethorphan (PROMETHAZINE-DM) 6.25-15 MG/5ML syrup        -Red flags and when to present for emergency care or RTC including fever >101.11F, chest pain, shortness of breath, new/worsening/un-resolving symptoms,  reviewed with patient at time of visit. Follow up and care instructions discussed and provided in AVS. - I discussed the assessment and treatment plan with the patient. The patient was provided an opportunity to ask questions and all were answered. The patient agreed with the plan and demonstrated an understanding of the instructions.  I provided 20 minutes of non-face-to-face time during this encounter.  Bo Merino, FNP

## 2022-11-11 ENCOUNTER — Other Ambulatory Visit (INDEPENDENT_AMBULATORY_CARE_PROVIDER_SITE_OTHER): Payer: Self-pay | Admitting: Vascular Surgery

## 2022-11-21 LAB — HISTOPLASMA GAL'MANNAN AG SER: Histoplasma Gal'mannan Ag Ser: <0.5 (ref <0.5)

## 2022-12-08 ENCOUNTER — Ambulatory Visit (INDEPENDENT_AMBULATORY_CARE_PROVIDER_SITE_OTHER): Payer: Self-pay | Admitting: Family Medicine

## 2022-12-08 ENCOUNTER — Encounter: Payer: Self-pay | Admitting: Family Medicine

## 2022-12-08 VITALS — BP 166/84 | HR 94 | Temp 98.5°F | Resp 16 | Ht 74.5 in | Wt 247.3 lb

## 2022-12-08 DIAGNOSIS — D84821 Immunodeficiency due to drugs: Secondary | ICD-10-CM

## 2022-12-08 DIAGNOSIS — J302 Other seasonal allergic rhinitis: Secondary | ICD-10-CM

## 2022-12-08 DIAGNOSIS — J329 Chronic sinusitis, unspecified: Secondary | ICD-10-CM

## 2022-12-08 DIAGNOSIS — Z79899 Other long term (current) drug therapy: Secondary | ICD-10-CM

## 2022-12-08 MED ORDER — RYALTRIS 665-25 MCG/ACT NA SUSP: 2.0000 | Freq: Two times a day (BID) | NASAL | 2 refills | Status: DC | PRN

## 2022-12-08 MED ORDER — AMOXICILLIN-POT CLAVULANATE 875-125 MG PO TABS: 1.0000 | ORAL_TABLET | Freq: Two times a day (BID) | ORAL | 0 refills | Status: AC

## 2022-12-08 MED ORDER — MONTELUKAST SODIUM 10 MG PO TABS: 10.0000 mg | ORAL_TABLET | Freq: Every day | ORAL | 3 refills | Status: DC

## 2022-12-08 MED ORDER — LEVOCETIRIZINE DIHYDROCHLORIDE 5 MG PO TABS: 5.0000 mg | ORAL_TABLET | Freq: Every evening | ORAL | 5 refills | Status: DC

## 2022-12-08 NOTE — Progress Notes (Signed)
Patient ID: Jonathon Snow, male    DOB: 1961/03/24, 61 y.o.   MRN: [id number]  PCP: Delsa Grana, PA-C  Chief Complaint  Patient presents with   Sinusitis    Facial pressure, runny nose, scratchy throat onset for 2 weeks. Pt has done multiple COVID test at home all Negative   Cough    Subjective:   Jonathon Snow is a 61 y.o. male, presents to clinic with CC of the following:  HPI  Onset of viral URI symptoms more than 2 weeks ago, patient has done multiple COVID test all negative, has not been seen for this but he does have history of recurrent rhinosinusitis, he is immunocompromise on prednisone and CellCept He endorses facial and sinus pain and pressure, headaches, acute worsening last couple days after ongoing for more than 2 weeks He also has scratchy throat and some sore lymph nodes to his neck and behind his ear  He believes he is taking his montelukast but is not on any other nasal or allergy medications He has had some cough and shortness of breath but his inhalers do help a little bit  Patient Active Problem List   Diagnosis Date Noted   Coronary artery disease of native artery of native heart with stable angina pectoris ([place]) 09/09/2022   Current moderate episode of major depressive disorder ([place]) 09/09/2022   Varicose 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 01/03/2022   Immunocompromised state due to drug therapy ([place]) 09/18/2021   Prediabetes 11/08/2020   Stage 3a chronic kidney disease ([place]) 11/08/2020   Seasonal allergies 11/08/2020   Rhinosinusitis 11/08/2020   Long term current use of systemic steroids 11/08/2020   Anticoagulant disorder ([place]) 11/07/2020   Senile purpura ([place]) 11/07/2020   History of DVT (deep vein thrombosis) 03/22/2020   MDD (major depressive disorder), recurrent episode, mild ([place]) 09/12/2019   Other spondylosis with radiculopathy, lumbar region 02/16/2019   Osteoporosis 10/12/2018   Chronic venous insufficiency 10/07/2018    Lymphedema 10/07/2018   SLE glomerulonephritis syndrome, 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[place]) 03/03/2018   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 ([place]) 10/29/2016   Mixed hyperlipidemia 01/15/2016   Primary hypertension 01/15/2016   Obesity (BMI 30.0-34.9) 01/15/2016   Long term current use of anticoagulant 10/04/2015   Systemic lupus erythematosus ([place]) 05/30/2015   COPD, moderate ([place]) 06/27/2014   History of pulmonary embolism 12/11/2013   Nodule of right lung 12/11/2013   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   Cystic disease of liver 08/21/2013      Current Outpatient Medications:    albuterol (VENTOLIN HFA) 108 (90 Base) MCG/ACT inhaler, Inhale 2 puffs into the lungs every 6 (six) hours as needed for wheezing or shortness of breath., Disp: 18 g, Rfl: 2   amLODipine (NORVASC) 5 MG tablet, Take 1 tablet (5 mg total) by mouth daily., Disp: 90 tablet, Rfl: 3   apixaban (ELIQUIS) 5 MG TABS tablet, Take 1 tablet (5 mg total) by mouth 2 (two) times daily., Disp: 60 tablet, Rfl: 6   cholecalciferol (VITAMIN D3) 25 MCG (1000 UNIT) tablet, Take 1,000 Units by mouth daily., Disp: , Rfl:    losartan (COZAAR) 100 MG tablet, Take 1 tablet (100 mg total) by mouth daily., Disp: 90 tablet, Rfl: 1   meclizine (ANTIVERT) 25 MG tablet, Take 0.5-1 tablets (12.5-25 mg total) by mouth 3 (three) times daily as needed for dizziness., Disp: 30 tablet, Rfl: 1   montelukast (SINGULAIR) 10 MG  tablet, Take 1 tablet (10 mg total) by mouth at bedtime., Disp: 90 tablet, Rfl: 3   Multiple Vitamins-Minerals (MULTIVITAMIN WITH MINERALS) tablet, Take 1 tablet by mouth daily., Disp: , Rfl:    mycophenolate (CELLCEPT) 500 MG tablet, Take 1,000 mg by mouth 2 (two) times daily., Disp: , Rfl:    predniSONE (DELTASONE) 10 MG tablet, Take 1 tablet by mouth daily., Disp: , Rfl:    promethazine-dextromethorphan (PROMETHAZINE-DM) 6.25-15 MG/5ML syrup, Take 5 mLs by mouth 4 (four) times  daily as needed for cough., Disp: 118 mL, Rfl: 0   rosuvastatin (CRESTOR) 10 MG tablet, Take 1 tablet (10 mg total) by mouth daily., Disp: 90 tablet, Rfl: 3   sertraline (ZOLOFT) 25 MG tablet, Take 1 tablet (25 mg total) by mouth daily., Disp: 90 tablet, Rfl: 3   sildenafil (REVATIO) 20 MG tablet, 3-5 tablets as needed 30 min prior to intercourse, Disp: 30 tablet, Rfl: 11   Allergies  Allergen Reactions   Enalapril Other (See Comments)    Unknown reaction   Vicodin [Hydrocodone-Acetaminophen] Hives and Rash    Severe headaches (also) NAME BRAND ONLY PER PT CAN TAKE GENERIC     Social History   Tobacco Use   Smoking status: Never   Smokeless tobacco: Never  Vaping Use   Vaping Use: Never used  Substance Use Topics   Alcohol use: No   Drug use: No    Comment: PT DENIES      Chart Review Today: I personally reviewed active problem list, medication list, allergies, family history, social history, health maintenance, notes from last encounter, lab results, imaging with the patient/caregiver today.   Review of Systems  Constitutional: Negative.   HENT: Negative.    Eyes: Negative.   Respiratory: Negative.    Cardiovascular: Negative.   Gastrointestinal: Negative.   Endocrine: Negative.   Genitourinary: Negative.   Musculoskeletal: Negative.   Skin: Negative.   Allergic/Immunologic: Negative.   Neurological: Negative.   Hematological: Negative.   Psychiatric/Behavioral: Negative.    All other systems reviewed and are negative.      Objective:   Vitals:   12/08/22 1119  BP: (!) 166/84  Pulse: 94  Resp: 16  Temp: 98.5 F (36.9 C)  TempSrc: Oral  SpO2: 95%  Weight: 247 lb 4.8 oz (112.2 kg)  Height: 6' 2.5" (1.892 m)    Body mass index is 31.33 kg/m.  Physical Exam Vitals and nursing note reviewed.  Constitutional:      General: He is not in acute distress.    Appearance: He is well-developed. He is obese. He is not toxic-appearing or diaphoretic. Ill  appearance: mildly ill appearing. HENT:     Head: Normocephalic and atraumatic.     Comments: Subsubmandibular lymphadenopathy and left postauricular lymphadenopathy    Right Ear: Tympanic membrane, ear canal and external ear normal. There is no impacted cerumen.     Left Ear: Ear canal and external ear normal. There is no impacted cerumen.     Ears:     Comments: Left TM with some sclerosis and possible serous effusion    Nose: Mucosal edema, congestion and rhinorrhea present.     Right Sinus: Maxillary sinus tenderness and frontal sinus tenderness present.     Left Sinus: Maxillary sinus tenderness and frontal sinus tenderness present.     Mouth/Throat:     Mouth: Mucous membranes are moist.     Pharynx: Oropharynx is clear. Posterior oropharyngeal erythema present. No oropharyngeal exudate.  Eyes:  General: No scleral icterus.       Right eye: No discharge.        Left eye: No discharge.     Conjunctiva/sclera: Conjunctivae normal.  Neck:     Trachea: No tracheal deviation.  Cardiovascular:     Rate and Rhythm: Normal rate and regular rhythm.  Pulmonary:     Effort: Pulmonary effort is normal. No tachypnea, accessory muscle usage or respiratory distress.     Breath sounds: No stridor.     Comments: Slightly diminished coarse breath sounds bilaterally at the bases but no rales or wheeze Musculoskeletal:        General: Normal range of motion.  Skin:    General: Skin is warm and dry.     Findings: No rash.  Neurological:     Mental Status: He is alert.     Motor: No abnormal muscle tone.     Coordination: Coordination normal.  Psychiatric:        Behavior: Behavior normal.      Results for orders placed or performed in visit on 10/01/22  Comprehensive metabolic panel  Result Value Ref Range   Sodium 137 135 - 145 mmol/L   Potassium 4.2 3.5 - 5.1 mmol/L   Chloride 107 98 - 111 mmol/L   CO2 24 22 - 32 mmol/L   Glucose, Bld 108 (H) 70 - 99 mg/dL   BUN 24 (H) 6 - 20  mg/dL   Creatinine, Ser 1.37 (H) 0.61 - 1.24 mg/dL   Calcium 8.8 (L) 8.9 - 10.3 mg/dL   Total Protein 7.2 6.5 - 8.1 g/dL   Albumin 3.7 3.5 - 5.0 g/dL   AST 19 15 - 41 U/L   ALT 21 0 - 44 U/L   Alkaline Phosphatase 87 38 - 126 U/L   Total Bilirubin 0.7 0.3 - 1.2 mg/dL   GFR, Estimated 59 (L) >60 mL/min   Anion gap 6 5 - 15  CBC with Differential/Platelet  Result Value Ref Range   WBC 5.4 4.0 - 10.5 K/uL   RBC 5.51 4.22 - 5.81 MIL/uL   Hemoglobin 16.0 13.0 - 17.0 g/dL   HCT 49.5 39.0 - 52.0 %   MCV 89.8 80.0 - 100.0 fL   MCH 29.0 26.0 - 34.0 pg   MCHC 32.3 30.0 - 36.0 g/dL   RDW 14.6 11.5 - 15.5 %   Platelets 149 (L) 150 - 400 K/uL   nRBC 0.0 0.0 - 0.2 %   Neutrophils Relative % 85 %   Neutro Abs 4.6 1.7 - 7.7 K/uL   Lymphocytes Relative 7 %   Lymphs Abs 0.4 (L) 0.7 - 4.0 K/uL   Monocytes Relative 7 %   Monocytes Absolute 0.4 0.1 - 1.0 K/uL   Eosinophils Relative 1 %   Eosinophils Absolute 0.0 0.0 - 0.5 K/uL   Basophils Relative 0 %   Basophils Absolute 0.0 0.0 - 0.1 K/uL   Immature Granulocytes 0 %   Abs Immature Granulocytes 0.02 0.00 - 0.07 K/uL       Assessment & Plan:    1. Recurrent rhinosinusitis Onset of symptoms more than 2 weeks ago in an immunocompromise patient with sinus and facial pain and pressure both worsening and tender on exam Treat with Augmentin also other supportive measures encouraged to the patient due to his recurrent nature and I did encourage him to consult with ENT, to stay on his allergy medications particularly when he gets acutely ill and to also add some saline  spray - Olopatadine-Mometasone (RYALTRIS) 665-25 MCG/ACT SUSP; Place 2 sprays into the nose 2 (two) times daily as needed.  Dispense: 29 g; Refill: 2 - levocetirizine (XYZAL) 5 MG tablet; Take 1 tablet (5 mg total) by mouth every evening.  Dispense: 30 tablet; Refill: 5 - amoxicillin-clavulanate (AUGMENTIN) 875-125 MG tablet; Take 1 tablet by mouth 2 (two) times daily for 10 days.   Dispense: 20 tablet; Refill: 0 - Ambulatory referral to ENT  2. Seasonal allergies Refill on Singulair he also may want to start an antihistamine and nasal sprays - montelukast (SINGULAIR) 10 MG tablet; Take 1 tablet (10 mg total) by mouth at bedtime.  Dispense: 90 tablet; Refill: 3  3. Immunocompromised state due to drug therapy ([place]) Lower threshold for antibiotics in this particular patient his last sinusitis and antibiotic was about 5 months ago   Follow-up if not improving particularly with his cough he has a slight asthma exacerbation but does not want any increase of steroids he will continue to use his inhaler and over-the-counter cough medications   Delsa Grana, PA-C 12/08/22 11:35 AM

## 2022-12-10 ENCOUNTER — Telehealth (INDEPENDENT_AMBULATORY_CARE_PROVIDER_SITE_OTHER): Payer: Self-pay | Admitting: Physician Assistant

## 2022-12-10 ENCOUNTER — Encounter: Payer: Self-pay | Admitting: Family Medicine

## 2022-12-10 DIAGNOSIS — J069 Acute upper respiratory infection, unspecified: Secondary | ICD-10-CM

## 2022-12-10 MED ORDER — PROMETHAZINE-DM 6.25-15 MG/5ML PO SYRP: 5.0000 mL | ORAL_SOLUTION | Freq: Four times a day (QID) | ORAL | 0 refills | Status: DC | PRN

## 2022-12-10 NOTE — Progress Notes (Signed)
Virtual Visit via Video Note  I connected with Jonathon Snow on 12/10/22 at 10:00 AM EST by a video enabled telemedicine application and verified that I am speaking with the correct person using two identifiers.  Today's Provider: Talitha Givens, MHS, PA-C Introduced myself to the patient as a PA-C and provided education on APPs in clinical practice.    Location: Patient: at home  Provider: [place], [place]    I discussed the limitations of evaluation and management by telemedicine and the availability of in person appointments. The patient expressed understanding and agreed to proceed.   Chief Complaint  Patient presents with   Diarrhea    Nauseous yesterday   Cough    Has got worst      History of Present Illness:    Reports he was seen by his PCP on Monday  Was instructed to follow up if he started to feel worse  Reports yesterday his symptoms started to escalate- states cough is not managed with OTC medications Reports diarrhea also started yesterday and has continued today  States he also had some brief chills and nausea  He reports intermittently productive coughing Interventions: Cough drops, Delsym generic every 4 hours  He reports negative home COVID tests since symptoms started  He states he has started the Augmentin - yesterday  Patient was seen on 12/08/22 for similar symptoms- Augmentin for rhinosinusitis    Review of Systems  Constitutional:  Positive for chills.  Respiratory:  Positive for cough.   Gastrointestinal:  Positive for diarrhea and nausea.      Outpatient Encounter Medications as of 12/10/2022  Medication Sig   albuterol (VENTOLIN HFA) 108 (90 Base) MCG/ACT inhaler Inhale 2 puffs into the lungs every 6 (six) hours as needed for wheezing or shortness of breath.   amLODipine (NORVASC) 5 MG tablet Take 1 tablet (5 mg total) by mouth daily.   amoxicillin-clavulanate (AUGMENTIN) 875-125 MG tablet Take 1 tablet by  mouth 2 (two) times daily for 10 days.   apixaban (ELIQUIS) 5 MG TABS tablet Take 1 tablet (5 mg total) by mouth 2 (two) times daily.   cholecalciferol (VITAMIN D3) 25 MCG (1000 UNIT) tablet Take 1,000 Units by mouth daily.   levocetirizine (XYZAL) 5 MG tablet Take 1 tablet (5 mg total) by mouth every evening.   losartan (COZAAR) 100 MG tablet Take 1 tablet (100 mg total) by mouth daily.   meclizine (ANTIVERT) 25 MG tablet Take 0.5-1 tablets (12.5-25 mg total) by mouth 3 (three) times daily as needed for dizziness.   montelukast (SINGULAIR) 10 MG tablet Take 1 tablet (10 mg total) by mouth at bedtime.   Multiple Vitamins-Minerals (MULTIVITAMIN WITH MINERALS) tablet Take 1 tablet by mouth daily.   mycophenolate (CELLCEPT) 500 MG tablet Take 1,000 mg by mouth 2 (two) times daily.   Olopatadine-Mometasone (RYALTRIS) [id number] MCG/ACT SUSP Place 2 sprays into the nose 2 (two) times daily as needed.   predniSONE (DELTASONE) 10 MG tablet Take 1 tablet by mouth daily.   promethazine-dextromethorphan (PROMETHAZINE-DM) 6.25-15 MG/5ML syrup Take 5 mLs by mouth 4 (four) times daily as needed for cough.   rosuvastatin (CRESTOR) 10 MG tablet Take 1 tablet (10 mg total) by mouth daily.   sertraline (ZOLOFT) 25 MG tablet Take 1 tablet (25 mg total) by mouth daily.   sildenafil (REVATIO) 20 MG tablet 3-5 tablets as needed 30 min prior to intercourse   No facility-administered encounter medications on file as of 12/10/2022.  Observations/Objective:   Due to the nature of the virtual visit, physical exam and observations are limited. Able to obtain the following observations:   Alert, oriented x 3  Appears comfortable, in no acute distress.  No scleral injection, no appreciated hoarseness, tachypnea, wheeze or strider. Able to maintain conversation without visible strain.  Mild  cough appreciated during visit.     Assessment and Plan:  Problem List Items Addressed This Visit   None Visit  Diagnoses     Upper respiratory tract infection, unspecified type    -  Primary Acute, ongoing Reviewed previous encounter notes from PCP for rhinosinutis from 12/08/22 Patient started Augmentin yesterday - reviewed that it will take more time for this to reach therapeutic efficacy for symptom management Will send in Promethazine cough medication to assist with coughing as he is already on daily Prednisone  Reviewed continued use of OTC medications to provide symptomatic relief Reviewed use of Pepto and Imodium PRN for diarrhea symptoms Follow up recommended in office if symptoms are not improving or worsening.     Relevant Medications   promethazine-dextromethorphan (PROMETHAZINE-DM) 6.25-15 MG/5ML syrup      Follow Up Instructions:    I discussed the assessment and treatment plan with the patient. The patient was provided an opportunity to ask questions and all were answered. The patient agreed with the plan and demonstrated an understanding of the instructions.   The patient was advised to call back or seek an in-person evaluation if the symptoms worsen or if the condition fails to improve as anticipated.  I provided 7 minutes of non-face-to-face time during this encounter.  No follow-ups on file.   I, Jomo Forand E Calani Gick, PA-C, have reviewed all documentation for this visit. The documentation on 12/10/22 for the exam, diagnosis, procedures, and orders are all accurate and complete.   Talitha Givens, MHS, PA-C [facility]

## 2022-12-18 ENCOUNTER — Encounter: Payer: Self-pay | Admitting: Podiatry

## 2022-12-30 ENCOUNTER — Ambulatory Visit (INDEPENDENT_AMBULATORY_CARE_PROVIDER_SITE_OTHER): Payer: BC Managed Care – PPO | Admitting: Vascular Surgery

## 2022-12-30 DIAGNOSIS — N1831 Chronic kidney disease, stage 3a: Secondary | ICD-10-CM | POA: Diagnosis not present

## 2022-12-30 DIAGNOSIS — M3214 Glomerular disease in systemic lupus erythematosus: Secondary | ICD-10-CM | POA: Diagnosis not present

## 2023-01-10 ENCOUNTER — Other Ambulatory Visit: Payer: Self-pay | Admitting: Urology

## 2023-01-13 ENCOUNTER — Ambulatory Visit (INDEPENDENT_AMBULATORY_CARE_PROVIDER_SITE_OTHER): Payer: BLUE CROSS/BLUE SHIELD | Admitting: Vascular Surgery

## 2023-01-13 ENCOUNTER — Encounter (INDEPENDENT_AMBULATORY_CARE_PROVIDER_SITE_OTHER): Payer: Self-pay | Admitting: Vascular Surgery

## 2023-01-13 VITALS — BP 157/93 | HR 76 | Resp 16 | Wt 242.8 lb

## 2023-01-13 DIAGNOSIS — E782 Mixed hyperlipidemia: Secondary | ICD-10-CM | POA: Diagnosis not present

## 2023-01-13 DIAGNOSIS — I10 Essential (primary) hypertension: Secondary | ICD-10-CM

## 2023-01-13 DIAGNOSIS — I890 Lymphedema, not elsewhere classified: Secondary | ICD-10-CM | POA: Diagnosis not present

## 2023-01-13 DIAGNOSIS — I8312 Varicose 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: Secondary | ICD-10-CM | POA: Diagnosis not present

## 2023-01-13 DIAGNOSIS — N1831 Chronic kidney disease, stage 3a: Secondary | ICD-10-CM

## 2023-01-13 NOTE — Assessment & Plan Note (Addendum)
The patient has stage 2 lymphedema with fixed skin changes, hyperpigmentation, persistent swelling and impending ulceration.  He has been diligently elevating his legs, trying to exercise, and use compression hose for greater than 4 weeks.  He is actually doing much of this for years.  He would clearly benefit from a lymphedema pump and we will submit this again.  We will see him back in 3 months.

## 2023-01-13 NOTE — Progress Notes (Addendum)
MRN : [id number]  Jonathon Snow is a 62 y.o. (1961-11-18) male who presents with chief complaint of  Chief Complaint  Patient presents with   Follow-up    3-4 month follow up  .  History of Present Illness: Patient returns today in follow up of his left leg lymphedema.  He has fixed skin changes with hyperpigmentation on that left leg and has an area of impending ulceration just above the left medial ankle.  The skin is cracked and dry and appears to be very near ulcerated at this point.  No fevers or chills.  He has been diligently wearing his 20 to 30 mmHg compression socks.  He elevates his legs and remains active.  We attempted to get a lymphedema pump, but up to this point he has not been able to get this approved through his insurance.  Current Outpatient Medications  Medication Sig Dispense Refill   albuterol (VENTOLIN HFA) 108 (90 Base) MCG/ACT inhaler Inhale 2 puffs into the lungs every 6 (six) hours as needed for wheezing or shortness of breath. 18 g 2   amLODipine (NORVASC) 5 MG tablet Take 1 tablet (5 mg total) by mouth daily. 90 tablet 3   apixaban (ELIQUIS) 5 MG TABS tablet Take 1 tablet (5 mg total) by mouth 2 (two) times daily. 60 tablet 6   cholecalciferol (VITAMIN D3) 25 MCG (1000 UNIT) tablet Take 1,000 Units by mouth daily.     levocetirizine (XYZAL) 5 MG tablet Take 1 tablet (5 mg total) by mouth every evening. 30 tablet 5   losartan (COZAAR) 100 MG tablet Take 1 tablet (100 mg total) by mouth daily. 90 tablet 1   meclizine (ANTIVERT) 25 MG tablet Take 0.5-1 tablets (12.5-25 mg total) by mouth 3 (three) times daily as needed for dizziness. 30 tablet 1   montelukast (SINGULAIR) 10 MG tablet Take 1 tablet (10 mg total) by mouth at bedtime. 90 tablet 3   Multiple Vitamins-Minerals (MULTIVITAMIN WITH MINERALS) tablet Take 1 tablet by mouth daily.     mycophenolate (CELLCEPT) 500 MG tablet Take 1,000 mg by mouth 2 (two) times daily.     Olopatadine-Mometasone (RYALTRIS)  [id number] MCG/ACT SUSP Place 2 sprays into the nose 2 (two) times daily as needed. 29 g 2   predniSONE (DELTASONE) 10 MG tablet Take 1 tablet by mouth daily.     promethazine-dextromethorphan (PROMETHAZINE-DM) 6.25-15 MG/5ML syrup Take 5 mLs by mouth 4 (four) times daily as needed for cough. 118 mL 0   rosuvastatin (CRESTOR) 10 MG tablet Take 1 tablet (10 mg total) by mouth daily. 90 tablet 3   sertraline (ZOLOFT) 25 MG tablet Take 1 tablet (25 mg total) by mouth daily. 90 tablet 3   sildenafil (REVATIO) 20 MG tablet TAKE 3 TO 5 TABLETS BY MOUTH AS NEEDED 30 MIN PRIOR TO INTERCOURSE 30 tablet 5   No current facility-administered medications for this visit.    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   Overview:  superior sagittal sinus, left transverse sinus and cortical veins    COVID-19 virus infection 12/2020   Depression    DVT (deep venous thrombosis) (HCC)    Dyspnea    GERD (gastroesophageal reflux disease)    Heel spur, left 02/16/2019   Heel spur, right 02/16/2019   Herpes zoster infection of lumbar region 02/20/2020   Hyperlipidemia    Hypertension    Lupus ([place])  Lymphedema 10/07/2018   Morbid obesity ([place])    Opiate abuse, episodic ([place]) 02/26/2018   Osteoporosis    Pneumonia    PONV (postoperative nausea and vomiting)    Postphlebitic syndrome with ulcer, 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   Presence of IVC filter 03/22/2020   Removed   Pulmonary embolism ([place])    Renal disorder    Stage III    Past Surgical History:  Procedure Laterality Date   ANKLE SURGERY Right    BACK SURGERY     BRONCHIAL WASHINGS N/A 11/01/2021   Procedure: BRONCHIAL WASHINGS;  Surgeon: [name], MD;  Location: [facility] ORS;  Service: Thoracic;  Laterality: N/A;   COLONOSCOPY WITH PROPOFOL N/A 05/28/2020   Procedure: COLONOSCOPY WITH PROPOFOL;  Surgeon: Jonathon Bellows, MD;  Location: [facility] ENDOSCOPY;  Service: Endoscopy;   Laterality: N/A;   CYST EXCISION  92 or 93    Liver cyst removal [facility]   FLEXIBLE BRONCHOSCOPY N/A 11/01/2021   Procedure: FLEXIBLE BRONCHOSCOPY;  Surgeon: [name], MD;  Location: [facility] ORS;  Service: Thoracic;  Laterality: N/A;   I & D EXTREMITY Right 04/29/2017   Procedure: IRRIGATION AND DEBRIDEMENT EXTREMITY;  Surgeon: Clayburn Pert, MD;  Location: [facility] ORS;  Service: General;  Laterality: Right;   IRRIGATION AND DEBRIDEMENT ABSCESS Left 04/29/2017   Procedure: IRRIGATION AND DEBRIDEMENT Scrotal ABSCESS;  Surgeon: Clayburn Pert, MD;  Location: [facility] ORS;  Service: General;  Laterality: Left;     Social History   Tobacco Use   Smoking status: Never   Smokeless tobacco: Never  Vaping Use   Vaping Use: Never used  Substance Use Topics   Alcohol use: No   Drug use: No    Comment: PT DENIES      Family History  Problem Relation Age of Onset   Hypertension Father    Heart disease Father    Clotting disorder Mother    Kidney disease Brother    Heart attack Maternal Grandmother    Heart attack Maternal Grandfather    Heart attack Paternal Grandfather      Allergies  Allergen Reactions   Enalapril Other (See Comments)    Unknown reaction   Vicodin [Hydrocodone-Acetaminophen] Hives and Rash    Severe headaches (also) NAME BRAND ONLY PER PT CAN TAKE GENERIC     REVIEW OF SYSTEMS (Negative unless checked)   Constitutional: []$ Weight loss  []$ Fever  []$ Chills Cardiac: []$ Chest pain   []$ Chest pressure   []$ Palpitations   []$ Shortness of breath when laying flat   []$ Shortness of breath at rest   []$ Shortness of breath with exertion. Vascular:  []$ Pain in legs with walking   []$ Pain in legs at rest   []$ Pain in legs when laying flat   []$ Claudication   []$ Pain in feet when walking  []$ Pain in feet at rest  []$ Pain in feet when laying flat   [x]$ History of DVT   []$ Phlebitis   [x]$ Swelling in legs   [x]$ Varicose veins   []$ Non-healing ulcers Pulmonary:   []$ Uses home oxygen    []$ Productive cough   []$ Hemoptysis   []$ Wheeze  []$ COPD   []$ Asthma Neurologic:  []$ Dizziness  []$ Blackouts   []$ Seizures   []$ History of stroke   []$ History of TIA  []$ Aphasia   []$ Temporary blindness   []$ Dysphagia   []$ Weakness or numbness in arms   []$ Weakness or numbness in legs Musculoskeletal:  [x]$ Arthritis   []$ Joint swelling   []$ Joint pain   []$ Low back pain Hematologic:  []$ Easy bruising  []$ Easy bleeding   []$ Hypercoagulable state   []$ Anemic  Gastrointestinal:  []$ Blood in stool   []$ Vomiting blood  []$ Gastroesophageal reflux/heartburn   []$ Abdominal pain Genitourinary:  [x]$ Chronic kidney disease   []$ Difficult urination  []$ Frequent urination  []$ Burning with urination   []$ Hematuria Skin:  []$ Rashes   []$ Ulcers   []$ Wounds Psychological:  []$ History of anxiety   []$  History of major depression.     Physical Examination  BP (!) 157/93 (BP Location: Right Arm)   Pulse 76   Resp 16   Wt 242 lb 12.8 oz (110.1 kg)   BMI 30.76 kg/m  Gen:  WD/WN, NAD Head: [place]/AT, No temporalis wasting. Ear/Nose/Throat: Hearing grossly intact, nares w/o erythema or drainage Eyes: Conjunctiva clear. Sclera non-icteric Neck: Supple.  Trachea midline Pulmonary:  Good air movement, no use of accessory muscles.  Cardiac: RRR, no JVD Vascular:  Vessel Right Left  Radial Palpable Palpable           Musculoskeletal: M/S 5/5 throughout.  No deformity or atrophy.  Moderate stasis dermatitis changes are present in the left lower extremity with impending ulceration just above the left medial ankle.  Hyperpigmentation is present.  1-2+ left lower extremity edema. Neurologic: Sensation grossly intact in extremities.  Symmetrical.  Speech is fluent.  Psychiatric: Judgment intact, Mood & affect appropriate for pt's clinical situation. Dermatologic: No rashes or ulcers noted.  No cellulitis or open wounds.      Labs No results found for this or any previous visit (from the past 2160 hour(s)).  Radiology VAS [place] LOWER EXTREMITY  VENOUS REFLUX  Result Date: 02/11/2023  Lower Venous Reflux Study Patient Name:  KAMDEN LEAPER  Date of Exam:   02/06/2023 Medical Rec #: [id number]       Accession #:    [id number] Date of Birth: 1961/01/01      Patient Gender: M Patient Age:   52 years Exam Location:  [facility] Procedure:      VAS [place] LOWER EXTREMITY VENOUS REFLUX Referring Phys: Leotis Pain --------------------------------------------------------------------------------  Indications: Swelling.  Risk Factors: Left gsv ablation 2022. Performing Technologist: Concha Norway RVT  Examination Guidelines: A complete evaluation includes B-mode imaging, spectral Doppler, color Doppler, and power Doppler as needed of all accessible portions of each vessel. Bilateral testing is considered an integral part of a complete examination. Limited examinations for reoccurring indications may be performed as noted. The reflux portion of the exam is performed with the patient in reverse Trendelenburg. Significant venous reflux is defined as >500 ms in the superficial venous system, and >1 second in the deep venous system.  +----+---------+------+-----------+------------+--------+ LEFTReflux NoRefluxReflux TimeDiameter cmsComments               Yes                                  +----+---------+------+-----------+------------+--------+ CFV           yes   >1 second                      +----+---------+------+-----------+------------+--------+   Summary: Bilateral: - No evidence of deep vein thrombosis seen in the lower extremities, bilaterally, from the common femoral through the popliteal veins. - No evidence of superficial venous thrombosis in the lower extremities, bilaterally.  Right: - There is no evidence of venous reflux seen in the right lower extremity. - No evidence of superficial venous reflux seen in the right greater saphenous vein. - No evidence of  superficial venous reflux seen in the right short saphenous vein.  Left:  - No evidence of superficial venous reflux seen in the left short saphenous vein. - Venous reflux is noted in the left common femoral vein. - Left GSV closed by laser ablation  *See table(s) above for measurements and observations. Electronically signed by Leotis Pain MD on 02/11/2023 at 4:05:47 PM.    Final    US Venous Img Lower Bilateral  Result Date: 02/03/2023 CLINICAL DATA:  Acute right lower extremity swelling. EXAM: BILATERAL LOWER EXTREMITY VENOUS DOPPLER ULTRASOUND TECHNIQUE: Gray-scale sonography with graded compression, as well as color Doppler and duplex ultrasound were performed to evaluate the lower extremity deep venous systems from the level of the common femoral vein and including the common femoral, femoral, profunda femoral, popliteal and calf veins including the posterior tibial, peroneal and gastrocnemius veins when visible. The superficial great saphenous vein was also interrogated. Spectral Doppler was utilized to evaluate flow at rest and with distal augmentation maneuvers in the common femoral, femoral and popliteal veins. COMPARISON:  May 15, 2022. FINDINGS: RIGHT LOWER EXTREMITY Common Femoral Vein: No evidence of thrombus. Normal compressibility, respiratory phasicity and response to augmentation. Saphenofemoral Junction: No evidence of thrombus. Normal compressibility and flow on color Doppler imaging. Profunda Femoral Vein: No evidence of thrombus. Normal compressibility and flow on color Doppler imaging. Femoral Vein: No evidence of thrombus. Normal compressibility, respiratory phasicity and response to augmentation. Popliteal Vein: No evidence of thrombus. Normal compressibility, respiratory phasicity and response to augmentation. Calf Veins: No evidence of thrombus. Normal compressibility and flow on color Doppler imaging. Superficial Great Saphenous Vein: No evidence of thrombus. Normal compressibility. Venous Reflux:  None. Other Findings:  None. LEFT LOWER EXTREMITY Common  Femoral Vein: No evidence of thrombus. Normal compressibility, respiratory phasicity and response to augmentation. Saphenofemoral Junction: No evidence of thrombus. Normal compressibility and flow on color Doppler imaging. Profunda Femoral Vein: No evidence of thrombus. Normal compressibility and flow on color Doppler imaging. Femoral Vein: No evidence of thrombus. Normal compressibility, respiratory phasicity and response to augmentation. Popliteal Vein: Partial compressibility is noted with some flow present consistent with nonocclusive thrombus. Calf Veins: No evidence of thrombus. Normal compressibility and flow on color Doppler imaging. Superficial Great Saphenous Vein: No evidence of thrombus. Normal compressibility. Venous Reflux:  None. Other Findings:  None. IMPRESSION: Nonocclusive deep venous thrombosis is noted in the left popliteal vein. No deep venous thrombosis is noted in right lower extremity. These results will be called to the ordering clinician or representative by the Radiologist Assistant, and communication documented in the PACS or zVision Dashboard. Electronically Signed   By: Marijo Conception M.D.   On: 02/03/2023 14:32    Assessment/Plan Benign essential hypertension blood pressure control important in reducing the progression of atherosclerotic disease. On appropriate oral medications.     Stage 3a chronic kidney disease ([place]) May worsen LE swelling   Mixed hyperlipidemia lipid control important in reducing the progression of atherosclerotic disease. Continue statin therapy  Lymphedema The patient has stage 2 lymphedema with fixed skin changes, hyperpigmentation, persistent swelling and impending ulceration.  He has been diligently elevating his legs, trying to exercise, and use compression hose for greater than 4 weeks.  He is actually doing much of this for years.  He would clearly benefit from a lymphedema pump and we will submit this again.  We will see him back in 3  months.     Leotis Pain, MD  02/17/2023 4:18 PM  This note was created with Dragon medical transcription system.  Any errors from dictation are purely unintentional

## 2023-01-16 ENCOUNTER — Other Ambulatory Visit: Payer: Self-pay | Admitting: Urology

## 2023-01-16 NOTE — Telephone Encounter (Signed)
Patient missed last 2 appointments in June 2023, does patient need just Cysto/Trus rescheduled at this time or come back for OV first?

## 2023-01-19 NOTE — Telephone Encounter (Signed)
Left message to call back  

## 2023-01-21 NOTE — Telephone Encounter (Signed)
Left message to call back  

## 2023-01-21 NOTE — Telephone Encounter (Signed)
Patient advised. Medication is working maybe not as great as he would like but it is helping, rescheduled for Cysto/TRUS and refill provided as requested by the patient.

## 2023-01-22 DIAGNOSIS — N529 Male erectile dysfunction, unspecified: Secondary | ICD-10-CM | POA: Insufficient documentation

## 2023-02-03 ENCOUNTER — Encounter: Payer: Self-pay | Admitting: Physician Assistant

## 2023-02-03 ENCOUNTER — Telehealth (INDEPENDENT_AMBULATORY_CARE_PROVIDER_SITE_OTHER): Payer: Self-pay | Admitting: Vascular Surgery

## 2023-02-03 ENCOUNTER — Ambulatory Visit
Admission: RE | Admit: 2023-02-03 | Discharge: 2023-02-03 | Disposition: A | Discharge status: Home / Self Care | Payer: BLUE CROSS/BLUE SHIELD | Source: Ambulatory Visit | Attending: Physician Assistant | Admitting: Physician Assistant

## 2023-02-03 ENCOUNTER — Ambulatory Visit (INDEPENDENT_AMBULATORY_CARE_PROVIDER_SITE_OTHER): Payer: Self-pay | Admitting: Physician Assistant

## 2023-02-03 ENCOUNTER — Ambulatory Visit: Payer: Self-pay

## 2023-02-03 VITALS — BP 150/82 | HR 92 | Temp 98.1°F | Resp 16 | Ht 74.5 in | Wt 239.9 lb

## 2023-02-03 DIAGNOSIS — M7989 Other specified soft tissue disorders: Secondary | ICD-10-CM | POA: Insufficient documentation

## 2023-02-03 DIAGNOSIS — M79604 Pain in right leg: Secondary | ICD-10-CM

## 2023-02-03 DIAGNOSIS — I8312 Varicose 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: Secondary | ICD-10-CM | POA: Diagnosis not present

## 2023-02-03 DIAGNOSIS — I890 Lymphedema, not elsewhere classified: Secondary | ICD-10-CM

## 2023-02-03 DIAGNOSIS — I82432 Acute embolism and thrombosis of left popliteal vein: Secondary | ICD-10-CM | POA: Diagnosis not present

## 2023-02-03 HISTORY — DX: Pain in right leg: M79.89

## 2023-02-03 HISTORY — DX: Other specified soft tissue disorders: M79.604

## 2023-02-03 NOTE — Progress Notes (Signed)
Acute Office Visit   Patient: Jonathon Snow   DOB: Sep 21, 1961   62 y.o. Male  MRN: [id number] Visit Date: 02/03/2023  Today's healthcare provider: Dani Gobble Dmiya Malphrus, PA-C  Introduced myself to the patient as a Journalist, newspaper and provided education on APPs in clinical practice.    Chief Complaint  Patient presents with   Leg Swelling    Both legs, pt states has never had issues with right leg swelling and this time right leg is worst than left leg.   Subjective    HPI HPI     Leg Swelling    Additional comments: Both legs, pt states has never had issues with right leg swelling and this time right leg is worst than left leg.      Last edited by Salomon Fick, CMA on 02/03/2023 10:20 AM.       Reports both legs started swelling this weekend States he is concerned as his right leg has never swollen like this in the past   Reports previous hx of leg swelling and DVTs, PE, venous thrombosis on brain   He reports pain in ankles and feet - constant, 9/10 achy in character He reports skin changes as well - right medial and lateral ankle has petechiae  Left leg has chronic skin breakdown and discoloration along medial aspect of ankle  He denies recent injuries or trauma to the ankle He has been using his compression stocking daily- was wearing them yesterday - reports increased problems getting them on and off over the last few days  He reports recent weight loss - denies daily weight gain over 3 lbs  Medications: Outpatient Medications Prior to Visit  Medication Sig   albuterol (VENTOLIN HFA) 108 (90 Base) MCG/ACT inhaler Inhale 2 puffs into the lungs every 6 (six) hours as needed for wheezing or shortness of breath.   amLODipine (NORVASC) 5 MG tablet Take 1 tablet (5 mg total) by mouth daily.   apixaban (ELIQUIS) 5 MG TABS tablet Take 1 tablet (5 mg total) by mouth 2 (two) times daily.   cholecalciferol (VITAMIN D3) 25 MCG (1000 UNIT) tablet Take 1,000 Units by mouth  daily.   levocetirizine (XYZAL) 5 MG tablet Take 1 tablet (5 mg total) by mouth every evening.   losartan (COZAAR) 100 MG tablet Take 1 tablet (100 mg total) by mouth daily.   meclizine (ANTIVERT) 25 MG tablet Take 0.5-1 tablets (12.5-25 mg total) by mouth 3 (three) times daily as needed for dizziness.   montelukast (SINGULAIR) 10 MG tablet Take 1 tablet (10 mg total) by mouth at bedtime.   Multiple Vitamins-Minerals (MULTIVITAMIN WITH MINERALS) tablet Take 1 tablet by mouth daily.   mycophenolate (CELLCEPT) 500 MG tablet Take 1,000 mg by mouth 2 (two) times daily.   Olopatadine-Mometasone (RYALTRIS) [id number] MCG/ACT SUSP Place 2 sprays into the nose 2 (two) times daily as needed.   predniSONE (DELTASONE) 10 MG tablet Take 1 tablet by mouth daily.   promethazine-dextromethorphan (PROMETHAZINE-DM) 6.25-15 MG/5ML syrup Take 5 mLs by mouth 4 (four) times daily as needed for cough.   rosuvastatin (CRESTOR) 10 MG tablet Take 1 tablet (10 mg total) by mouth daily.   sertraline (ZOLOFT) 25 MG tablet Take 1 tablet (25 mg total) by mouth daily.   sildenafil (REVATIO) 20 MG tablet TAKE 3 TO 5 TABLETS BY MOUTH AS NEEDED 30 MIN PRIOR TO INTERCOURSE   No facility-administered medications prior to visit.    Review of  Systems  Constitutional:  Positive for chills.  Respiratory:  Positive for shortness of breath (states this is chronic for him- no recent changes to his baseline). Negative for chest tightness and wheezing.   Cardiovascular:  Positive for leg swelling. Negative for chest pain and palpitations.       Objective    BP (!) 150/82   Pulse 92   Temp 98.1 F (36.7 C) (Oral)   Resp 16   Ht 6' 2.5" (1.892 m)   Wt 239 lb 14.4 oz (108.8 kg)   SpO2 96%   BMI 30.39 kg/m    Physical Exam Vitals reviewed.  Constitutional:      Appearance: Normal appearance.  HENT:     Head: Normocephalic and atraumatic.  Cardiovascular:     Rate and Rhythm: Normal rate and regular rhythm.     Pulses:           Dorsalis pedis pulses are 1+ on the right side and 0 on the left side.       Posterior tibial pulses are 0 on the right side and 0 on the left side.     Heart sounds: Normal heart sounds.     Comments: Petechiae present along lateral and medial aspects of right ankle along with swelling of joint- tender to palpation along shin and calf of right lower leg  Pulmonary:     Effort: Pulmonary effort is normal.     Breath sounds: Normal breath sounds. No decreased air movement. No decreased breath sounds, wheezing, rhonchi or rales.  Musculoskeletal:     Cervical back: Normal range of motion.     Right lower leg: 1+ Pitting Edema present.     Left lower leg: 1+ Pitting Edema present.  Skin:    General: Skin is warm and dry.  Neurological:     General: No focal deficit present.     Mental Status: He is alert and oriented to person, place, and time. Mental status is at baseline.  Psychiatric:        Mood and Affect: Mood normal.        Behavior: Behavior normal.        Thought Content: Thought content normal.        Judgment: Judgment normal.       No results found for any visits on 02/03/23.  Assessment & Plan      No follow-ups on file.       Problem List Items Addressed This Visit       Cardiovascular and Mediastinum   Varicose 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   Relevant Orders   US Venous Img Lower Bilateral     Other   Lymphedema    Chronic, historic condition, ongoing  Reports left leg is usually swollen and discoloration along with skin scabbing is his normal  He sees vascular services regularly for this.  He reports he has been wearing compression socks as recommended by vascular but increased swelling of both lower legs has made donning and take off more difficult He reports pain today in both ankles and feet along with tingling sensation Will order [place] to rule out DVT given his previous history and new symptoms  Recommend close follow up with  Vascular services for evaluation and monitoring       Pain and swelling of right lower extremity - Primary    Acute, new concern Reports intense achy, 9/10 pain that started over the weekend in ankle and feet as  well as swelling and skin changes.  PE was notable for 1+ pitting edema to mid calf and tenderness with palpation along with petechiae on medial and lateral aspects of ankle, no increased warmth, underlying erythema, drainage or weeping. Skin appeared intact  He has been taking his Eliquis daily and using his compression socks as directed Will send for [place] of both legs to rule out DVT given his previous hx and recommend he follow closely with Vascular services for next steps pending imaging results        Relevant Orders   US Venous Img Lower Bilateral     No follow-ups on file.   I, Auria Mckinlay E Aramis Zobel, PA-C, have reviewed all documentation for this visit. The documentation on 02/03/23 for the exam, diagnosis, procedures, and orders are all accurate and complete.   Talitha Givens, MHS, PA-C [facility]

## 2023-02-03 NOTE — Assessment & Plan Note (Signed)
Chronic, historic condition, ongoing  Reports left leg is usually swollen and discoloration along with skin scabbing is his normal  He sees vascular services regularly for this.  He reports he has been wearing compression socks as recommended by vascular but increased swelling of both lower legs has made donning and take off more difficult He reports pain today in both ankles and feet along with tingling sensation Will order [place] to rule out DVT given his previous history and new symptoms  Recommend close follow up with Vascular services for evaluation and monitoring

## 2023-02-03 NOTE — Telephone Encounter (Signed)
Called and spoke to pt and he is waiting on vein and vascular to call him back with a possible appt

## 2023-02-03 NOTE — Telephone Encounter (Signed)
Patient called stating his legs and ankles were swollen really bad and right leg is getting spots around the ankle like his left leg.  He also states it is really hard to stand much less walk on them. He would like an appointment today if possible.  Please advise.

## 2023-02-03 NOTE — Telephone Encounter (Signed)
  Chief Complaint: Swollen legs Symptoms: Swollen painful legs Frequency: Usually just the left, since the weekend both legs Pertinent Negatives: Patient denies Fever Disposition: '[x]'$ ED /'[]'$ Urgent Care (no appt availability in office) / '[]'$ Appointment(In office/virtual)/ '[]'$  [facility]/ '[]'$ Home Care/ '[]'$ Refused Recommended Disposition /'[]'$ [facility]/ '[]'$  Follow-up with PCP Additional Notes: Pt states that is left leg is often swollen. And it comes and goes. Since the weekend the right leg is also swollen. Pain is 9/10. Pt has called his vascular provider and is waiting for a call back. PT refuses ED/UC.  Please advise.  Reason for Disposition  SEVERE leg swelling (e.g., swelling extends above knee, entire leg is swollen, weeping fluid)  Answer Assessment - Initial Assessment Questions 1. ONSET: "When did the swelling start?" (e.g., minutes, hours, days)     *No Answer* 2. LOCATION: "What part of the leg is swollen?"  "Are both legs swollen or just one leg?"     Both legs 3. SEVERITY: "How bad is the swelling?" (e.g., localized; mild, moderate, severe)   - Localized: Small area of swelling localized to one leg.   - MILD pedal edema: Swelling limited to foot and ankle, pitting edema < 1/4 inch (6 mm) deep, rest and elevation eliminate most or all swelling.   - MODERATE edema: Swelling of lower leg to knee, pitting edema > 1/4 inch (6 mm) deep, rest and elevation only partially reduce swelling.   - SEVERE edema: Swelling extends above knee, facial or hand swelling present.      moderate 4. REDNESS: "Does the swelling look red or infected?"     Yes - a little  5. PAIN: "Is the swelling painful to touch?" If Yes, ask: "How painful is it?"   (Scale 1-10; mild, moderate or severe)     9/10 6. FEVER: "Do you have a fever?" If Yes, ask: "What is it, how was it measured, and when did it start?"      no 7. CAUSE: "What do you think is causing the leg swelling?"     cellulitis 8.  MEDICAL HISTORY: "Do you have a history of blood clots (e.g., DVT), cancer, heart failure, kidney disease, or liver failure?"     Kidney disease 9. RECURRENT SYMPTOM: "Have you had leg swelling before?" If Yes, ask: "When was the last time?" "What happened that time?"     ongoing 10. OTHER SYMPTOMS: "Do you have any other symptoms?" (e.g., chest pain, difficulty breathing)       everyday 11. PREGNANCY: "Is there any chance you are pregnant?" "When was your last menstrual period?"  Protocols used: Leg Swelling and Edema-A-AH

## 2023-02-03 NOTE — Telephone Encounter (Signed)
He needs a bilateral reflux, I'm not sure if that can be done today but he should elevate and be sure to wear his compression socks

## 2023-02-03 NOTE — Assessment & Plan Note (Signed)
Acute, new concern Reports intense achy, 9/10 pain that started over the weekend in ankle and feet as well as swelling and skin changes.  PE was notable for 1+ pitting edema to mid calf and tenderness with palpation along with petechiae on medial and lateral aspects of ankle, no increased warmth, underlying erythema, drainage or weeping. Skin appeared intact  He has been taking his Eliquis daily and using his compression socks as directed Will send for [place] of both legs to rule out DVT given his previous hx and recommend he follow closely with Vascular services for next steps pending imaging results

## 2023-02-03 NOTE — Progress Notes (Signed)
It appears as though there is a new nonocclusive DVT in your left leg but the right was clear. It is important that you continue to take your anticoagulant, Eliquis, and wear your compression stockings. I have notified Dr. Lucky Cowboy of these results and it appears you have an apt with him on the 16th, please keep this apt. I would also recommend reaching out to your Hematology/Oncology provider, Dr. Rogue Bussing to make sure your current anticoagulation regimen is sufficient since there is  new clot. If you have further questions or concerns please let us know.

## 2023-02-04 ENCOUNTER — Other Ambulatory Visit (INDEPENDENT_AMBULATORY_CARE_PROVIDER_SITE_OTHER): Payer: Self-pay | Admitting: Vascular Surgery

## 2023-02-04 DIAGNOSIS — I890 Lymphedema, not elsewhere classified: Secondary | ICD-10-CM

## 2023-02-04 DIAGNOSIS — M7989 Other specified soft tissue disorders: Secondary | ICD-10-CM

## 2023-02-05 NOTE — Telephone Encounter (Signed)
Pt was notified to elevate legs and wear compressions. Also told to look forward to receiving a call from [place] to make and appt.

## 2023-02-06 ENCOUNTER — Ambulatory Visit (INDEPENDENT_AMBULATORY_CARE_PROVIDER_SITE_OTHER): Payer: BLUE CROSS/BLUE SHIELD | Admitting: Vascular Surgery

## 2023-02-06 ENCOUNTER — Ambulatory Visit (INDEPENDENT_AMBULATORY_CARE_PROVIDER_SITE_OTHER): Payer: BLUE CROSS/BLUE SHIELD

## 2023-02-06 DIAGNOSIS — M7989 Other specified soft tissue disorders: Secondary | ICD-10-CM

## 2023-02-06 DIAGNOSIS — I890 Lymphedema, not elsewhere classified: Secondary | ICD-10-CM

## 2023-02-13 ENCOUNTER — Encounter (INDEPENDENT_AMBULATORY_CARE_PROVIDER_SITE_OTHER): Payer: Self-pay | Admitting: Vascular Surgery

## 2023-02-13 ENCOUNTER — Ambulatory Visit (INDEPENDENT_AMBULATORY_CARE_PROVIDER_SITE_OTHER): Payer: BLUE CROSS/BLUE SHIELD | Admitting: Vascular Surgery

## 2023-02-13 VITALS — BP 136/84 | HR 86 | Resp 16 | Wt 239.0 lb

## 2023-02-13 DIAGNOSIS — I10 Essential (primary) hypertension: Secondary | ICD-10-CM | POA: Diagnosis not present

## 2023-02-13 DIAGNOSIS — I8312 Varicose 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: Secondary | ICD-10-CM

## 2023-02-13 DIAGNOSIS — I890 Lymphedema, not elsewhere classified: Secondary | ICD-10-CM

## 2023-02-13 DIAGNOSIS — N1831 Chronic kidney disease, stage 3a: Secondary | ICD-10-CM | POA: Diagnosis not present

## 2023-02-13 DIAGNOSIS — I872 Venous insufficiency (chronic) (peripheral): Secondary | ICD-10-CM | POA: Diagnosis not present

## 2023-02-13 NOTE — Assessment & Plan Note (Signed)
The patient has stage II lymphedema with hyperpigmentation and symptoms refractory to appropriate conservative measures of compression socks, elevation, and exercise.  He has been doing this for many months.  At this point, I am referring him to the lymphedema clinic for evaluation for manual lymphatic massage and would appreciate their expertise in helping his situation.  I will plan to see him back in 4 to 6 months.

## 2023-02-13 NOTE — Assessment & Plan Note (Signed)
Previous history of DVT and venous ablation as well.  Recent duplex does not show acute DVT despite initial reports from an outside duplex that suggested it might.  He is already on anticoagulation chronically.  Continue compression socks, elevation, and referral to the lymphedema clinic to manage his lymphedema which has developed.

## 2023-02-13 NOTE — Progress Notes (Signed)
MRN : [id number]  Jonathon Snow is a 62 y.o. (Sep 05, 1961) male who presents with chief complaint of  Chief Complaint  Patient presents with   Follow-up    Ultrasound results  .  History of Present Illness: Patient returns today in follow up of his leg swelling and lymphedema.  He went to his primary care physician ordered a duplex was interpreted as a DVT.  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  We repeated a duplex here earlier this week which showed no evidence of acute DVT or superficial thrombophlebitis in either lower extremity.  The left great saphenous vein has been ablated previously and remained ablated.  He is now having swelling in both legs.  He wears compression socks every day and has for months to years at this point.  He elevates his legs is much as possible he continues to exercise.  Despite this, he is developing skin changes to both lower extremities with hyperpigmentation and worsening swelling.  His lymphedema seems to be getting worse.  Current Outpatient Medications  Medication Sig Dispense Refill   albuterol (VENTOLIN HFA) 108 (90 Base) MCG/ACT inhaler Inhale 2 puffs into the lungs every 6 (six) hours as needed for wheezing or shortness of breath. 18 g 2   amLODipine (NORVASC) 5 MG tablet Take 1 tablet (5 mg total) by mouth daily. 90 tablet 3   apixaban (ELIQUIS) 5 MG TABS tablet Take 1 tablet (5 mg total) by mouth 2 (two) times daily. 60 tablet 6   cholecalciferol (VITAMIN D3) 25 MCG (1000 UNIT) tablet Take 1,000 Units by mouth daily.     levocetirizine (XYZAL) 5 MG tablet Take 1 tablet (5 mg total) by mouth every evening. 30 tablet 5   losartan (COZAAR) 100 MG tablet Take 1 tablet (100 mg total) by mouth daily. 90 tablet 1   meclizine (ANTIVERT) 25 MG tablet Take 0.5-1 tablets (12.5-25 mg total) by mouth 3 (three) times daily as needed for dizziness. 30 tablet 1    montelukast (SINGULAIR) 10 MG tablet Take 1 tablet (10 mg total) by mouth at bedtime. 90 tablet 3   Multiple Vitamins-Minerals (MULTIVITAMIN WITH MINERALS) tablet Take 1 tablet by mouth daily.     mycophenolate (CELLCEPT) 500 MG tablet Take 1,000 mg by mouth 2 (two) times daily.     Olopatadine-Mometasone (RYALTRIS) [id number] MCG/ACT SUSP Place 2 sprays into the nose 2 (two) times daily as needed. 29 g 2   predniSONE (DELTASONE) 10 MG tablet Take 1 tablet by mouth daily.     promethazine-dextromethorphan (PROMETHAZINE-DM) 6.25-15 MG/5ML syrup Take 5 mLs by mouth 4 (four) times daily as needed for cough. 118 mL 0   rosuvastatin (CRESTOR) 10 MG tablet Take 1 tablet (10 mg total) by mouth daily. 90 tablet 3   sertraline (ZOLOFT) 25 MG tablet Take 1 tablet (25 mg total) by mouth daily. 90 tablet 3   sildenafil (REVATIO) 20 MG tablet TAKE 3 TO 5 TABLETS BY MOUTH AS NEEDED 30 MIN PRIOR TO INTERCOURSE 30 tablet 5   No current facility-administered medications for this visit.    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   Overview:  superior sagittal sinus, left transverse sinus and cortical veins    COVID-19 virus infection 12/2020   Depression    DVT (deep venous thrombosis) ([place])  Dyspnea    GERD (gastroesophageal reflux disease)    Heel spur, left 02/16/2019   Heel spur, right 02/16/2019   Herpes zoster infection of lumbar region 02/20/2020   Hyperlipidemia    Hypertension    Lupus ([place])    Lymphedema 10/07/2018   Morbid obesity ([place])    Opiate abuse, episodic ([place]) 02/26/2018   Osteoporosis    Pneumonia    PONV (postoperative nausea and vomiting)    Postphlebitic syndrome with ulcer, 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   Presence of IVC filter 03/22/2020   Removed   Pulmonary embolism ([place])    Renal disorder    Stage III    Past Surgical History:  Procedure Laterality Date   ANKLE SURGERY Right    BACK SURGERY      BRONCHIAL WASHINGS N/A 11/01/2021   Procedure: BRONCHIAL WASHINGS;  Surgeon: [name], MD;  Location: [facility] ORS;  Service: Thoracic;  Laterality: N/A;   COLONOSCOPY WITH PROPOFOL N/A 05/28/2020   Procedure: COLONOSCOPY WITH PROPOFOL;  Surgeon: Jonathon Bellows, MD;  Location: [facility] ENDOSCOPY;  Service: Endoscopy;  Laterality: N/A;   CYST EXCISION  92 or 93    Liver cyst removal [facility]   FLEXIBLE BRONCHOSCOPY N/A 11/01/2021   Procedure: FLEXIBLE BRONCHOSCOPY;  Surgeon: [name], MD;  Location: [facility] ORS;  Service: Thoracic;  Laterality: N/A;   I & D EXTREMITY Right 04/29/2017   Procedure: IRRIGATION AND DEBRIDEMENT EXTREMITY;  Surgeon: Clayburn Pert, MD;  Location: [facility] ORS;  Service: General;  Laterality: Right;   IRRIGATION AND DEBRIDEMENT ABSCESS Left 04/29/2017   Procedure: IRRIGATION AND DEBRIDEMENT Scrotal ABSCESS;  Surgeon: Clayburn Pert, MD;  Location: [facility] ORS;  Service: General;  Laterality: Left;     Social History   Tobacco Use   Smoking status: Never   Smokeless tobacco: Never  Vaping Use   Vaping Use: Never used  Substance Use Topics   Alcohol use: No   Drug use: No    Comment: PT DENIES      Family History  Problem Relation Age of Onset   Hypertension Father    Heart disease Father    Clotting disorder Mother    Kidney disease Brother    Heart attack Maternal Grandmother    Heart attack Maternal Grandfather    Heart attack Paternal Grandfather      Allergies  Allergen Reactions   Enalapril Other (See Comments)    Unknown reaction   Vicodin [Hydrocodone-Acetaminophen] Hives and Rash    Severe headaches (also) NAME BRAND ONLY PER PT CAN TAKE GENERIC    REVIEW OF SYSTEMS (Negative unless checked)   Constitutional: '[]'$ Weight loss  '[]'$ Fever  '[]'$ Chills Cardiac: '[]'$ Chest pain   '[]'$ Chest pressure   '[]'$ Palpitations   '[]'$ Shortness of breath when laying flat   '[]'$ Shortness of breath at rest   '[]'$ Shortness of breath with exertion. Vascular:  '[]'$ Pain in legs with  walking   '[]'$ Pain in legs at rest   '[]'$ Pain in legs when laying flat   '[]'$ Claudication   '[]'$ Pain in feet when walking  '[]'$ Pain in feet at rest  '[]'$ Pain in feet when laying flat   '[x]'$ History of DVT   '[]'$ Phlebitis   '[x]'$ Swelling in legs   '[x]'$ Varicose veins   '[]'$ Non-healing ulcers Pulmonary:   '[]'$ Uses home oxygen   '[]'$ Productive cough   '[]'$ Hemoptysis   '[]'$ Wheeze  '[]'$ COPD   '[]'$ Asthma Neurologic:  '[]'$ Dizziness  '[]'$ Blackouts   '[]'$ Seizures   '[]'$ History of stroke   '[]'$ History of TIA  '[]'$ Aphasia   '[]'$ Temporary blindness   '[]'$ Dysphagia   '[]'$   Weakness or numbness in arms   '[]'$ Weakness or numbness in legs Musculoskeletal:  '[x]'$ Arthritis   '[]'$ Joint swelling   '[]'$ Joint pain   '[]'$ Low back pain Hematologic:  '[]'$ Easy bruising  '[]'$ Easy bleeding   '[]'$ Hypercoagulable state   '[]'$ Anemic   Gastrointestinal:  '[]'$ Blood in stool   '[]'$ Vomiting blood  '[]'$ Gastroesophageal reflux/heartburn   '[]'$ Abdominal pain Genitourinary:  '[x]'$ Chronic kidney disease   '[]'$ Difficult urination  '[]'$ Frequent urination  '[]'$ Burning with urination   '[]'$ Hematuria Skin:  '[]'$ Rashes   '[]'$ Ulcers   '[]'$ Wounds Psychological:  '[]'$ History of anxiety   '[]'$  History of major depression.   Physical Examination  BP 136/84 (BP Location: Right Arm)   Pulse 86   Resp 16   Wt 239 lb (108.4 kg)   BMI 30.28 kg/m  Gen:  WD/WN, NAD Head: [place]/AT, No temporalis wasting. Ear/Nose/Throat: Hearing grossly intact, nares w/o erythema or drainage Eyes: Conjunctiva clear. Sclera non-icteric Neck: Supple.  Trachea midline Pulmonary:  Good air movement, no use of accessory muscles.  Cardiac: RRR, no JVD Vascular:  Vessel Right Left  Radial Palpable Palpable                          PT Palpable Palpable  DP Palpable Palpable    Musculoskeletal: M/S 5/5 throughout.  No deformity or atrophy. 1+ RLE edema, 2+ LLE edema.  Moderate stasis dermatitis changes are present with hyperpigmentation. Neurologic: Sensation grossly intact in extremities.  Symmetrical.  Speech is fluent.  Psychiatric: Judgment intact, Mood &  affect appropriate for pt's clinical situation. Dermatologic: No rashes or ulcers noted.  No cellulitis or open wounds.      Labs No results found for this or any previous visit (from the past 2160 hour(s)).  Radiology VAS [place] LOWER EXTREMITY VENOUS REFLUX  Result Date: 02/11/2023  Lower Venous Reflux Study Patient Name:  Jonathon Snow  Date of Exam:   02/06/2023 Medical Rec #: [id number]       Accession #:    [id number] Date of Birth: 11-26-1961      Patient Gender: M Patient Age:   89 years Exam Location:  [facility] Procedure:      VAS [place] LOWER EXTREMITY VENOUS REFLUX Referring Phys: Leotis Pain --------------------------------------------------------------------------------  Indications: Swelling.  Risk Factors: Left gsv ablation 2022. Performing Technologist: Concha Norway RVT  Examination Guidelines: A complete evaluation includes B-mode imaging, spectral Doppler, color Doppler, and power Doppler as needed of all accessible portions of each vessel. Bilateral testing is considered an integral part of a complete examination. Limited examinations for reoccurring indications may be performed as noted. The reflux portion of the exam is performed with the patient in reverse Trendelenburg. Significant venous reflux is defined as >500 ms in the superficial venous system, and >1 second in the deep venous system.  +----+---------+------+-----------+------------+--------+ LEFTReflux NoRefluxReflux TimeDiameter cmsComments               Yes                                  +----+---------+------+-----------+------------+--------+ CFV           yes   >1 second                      +----+---------+------+-----------+------------+--------+   Summary: Bilateral: - No evidence of deep vein thrombosis seen in the lower extremities, bilaterally, from the common femoral through the  popliteal veins. - No evidence of superficial venous thrombosis in the lower extremities, bilaterally.   Right: - There is no evidence of venous reflux seen in the right lower extremity. - No evidence of superficial venous reflux seen in the right greater saphenous vein. - No evidence of superficial venous reflux seen in the right short saphenous vein.  Left: - No evidence of superficial venous reflux seen in the left short saphenous vein. - Venous reflux is noted in the left common femoral vein. - Left GSV closed by laser ablation  *See table(s) above for measurements and observations. Electronically signed by Leotis Pain MD on 02/11/2023 at 4:05:47 PM.    Final    US Venous Img Lower Bilateral  Result Date: 02/03/2023 CLINICAL DATA:  Acute right lower extremity swelling. EXAM: BILATERAL LOWER EXTREMITY VENOUS DOPPLER ULTRASOUND TECHNIQUE: Gray-scale sonography with graded compression, as well as color Doppler and duplex ultrasound were performed to evaluate the lower extremity deep venous systems from the level of the common femoral vein and including the common femoral, femoral, profunda femoral, popliteal and calf veins including the posterior tibial, peroneal and gastrocnemius veins when visible. The superficial great saphenous vein was also interrogated. Spectral Doppler was utilized to evaluate flow at rest and with distal augmentation maneuvers in the common femoral, femoral and popliteal veins. COMPARISON:  May 15, 2022. FINDINGS: RIGHT LOWER EXTREMITY Common Femoral Vein: No evidence of thrombus. Normal compressibility, respiratory phasicity and response to augmentation. Saphenofemoral Junction: No evidence of thrombus. Normal compressibility and flow on color Doppler imaging. Profunda Femoral Vein: No evidence of thrombus. Normal compressibility and flow on color Doppler imaging. Femoral Vein: No evidence of thrombus. Normal compressibility, respiratory phasicity and response to augmentation. Popliteal Vein: No evidence of thrombus. Normal compressibility, respiratory phasicity and response to augmentation.  Calf Veins: No evidence of thrombus. Normal compressibility and flow on color Doppler imaging. Superficial Great Saphenous Vein: No evidence of thrombus. Normal compressibility. Venous Reflux:  None. Other Findings:  None. LEFT LOWER EXTREMITY Common Femoral Vein: No evidence of thrombus. Normal compressibility, respiratory phasicity and response to augmentation. Saphenofemoral Junction: No evidence of thrombus. Normal compressibility and flow on color Doppler imaging. Profunda Femoral Vein: No evidence of thrombus. Normal compressibility and flow on color Doppler imaging. Femoral Vein: No evidence of thrombus. Normal compressibility, respiratory phasicity and response to augmentation. Popliteal Vein: Partial compressibility is noted with some flow present consistent with nonocclusive thrombus. Calf Veins: No evidence of thrombus. Normal compressibility and flow on color Doppler imaging. Superficial Great Saphenous Vein: No evidence of thrombus. Normal compressibility. Venous Reflux:  None. Other Findings:  None. IMPRESSION: Nonocclusive deep venous thrombosis is noted in the left popliteal vein. No deep venous thrombosis is noted in right lower extremity. These results will be called to the ordering clinician or representative by the Radiologist Assistant, and communication documented in the PACS or zVision Dashboard. Electronically Signed   By: Marijo Conception M.D.   On: 02/03/2023 14:32    Assessment/Plan Benign essential hypertension blood pressure control important in reducing the progression of atherosclerotic disease. On appropriate oral medications.     Stage 3a chronic kidney disease ([place]) May worsen LE swelling   Mixed hyperlipidemia lipid control important in reducing the progression of atherosclerotic disease. Continue statin therapy  Lymphedema The patient has stage II lymphedema with hyperpigmentation and symptoms refractory to appropriate conservative measures of compression socks,  elevation, and exercise.  He has been doing this for many months.  At this point, I  am referring him to the lymphedema clinic for evaluation for manual lymphatic massage and would appreciate their expertise in helping his situation.  I will plan to see him back in 4 to 6 months.  Chronic venous insufficiency Previous history of DVT and venous ablation as well.  Recent duplex does not show acute DVT despite initial reports from an outside duplex that suggested it might.  He is already on anticoagulation chronically.  Continue compression socks, elevation, and referral to the lymphedema clinic to manage his lymphedema which has developed.    Leotis Pain, MD  02/13/2023 10:27 AM    This note was created with Dragon medical transcription system.  Any errors from dictation are purely unintentional

## 2023-02-18 ENCOUNTER — Other Ambulatory Visit: Payer: Self-pay

## 2023-02-18 ENCOUNTER — Inpatient Hospital Stay
Admission: EM | Admit: 2023-02-18 | Discharge: 2023-02-20 | DRG: 481 | Disposition: A | Discharge status: Home Health | Payer: No Typology Code available for payment source | Attending: Student in an Organized Health Care Education/Training Program | Admitting: Student in an Organized Health Care Education/Training Program

## 2023-02-18 ENCOUNTER — Emergency Department: Payer: No Typology Code available for payment source

## 2023-02-18 ENCOUNTER — Ambulatory Visit: Payer: Self-pay | Admitting: Urology

## 2023-02-18 ENCOUNTER — Encounter: Payer: Self-pay | Admitting: *Deleted

## 2023-02-18 DIAGNOSIS — W010XXA Fall on same level from slipping, tripping and stumbling without subsequent striking against object, initial encounter: Secondary | ICD-10-CM | POA: Diagnosis present

## 2023-02-18 DIAGNOSIS — Z841 Family history of disorders of kidney and ureter: Secondary | ICD-10-CM

## 2023-02-18 DIAGNOSIS — Z888 Allergy status to other drugs, medicaments and biological substances status: Secondary | ICD-10-CM | POA: Diagnosis not present

## 2023-02-18 DIAGNOSIS — N1831 Chronic kidney disease, stage 3a: Secondary | ICD-10-CM | POA: Diagnosis present

## 2023-02-18 DIAGNOSIS — I2510 Atherosclerotic heart disease of native coronary artery without angina pectoris: Secondary | ICD-10-CM | POA: Diagnosis present

## 2023-02-18 DIAGNOSIS — J449 Chronic obstructive pulmonary disease, unspecified: Secondary | ICD-10-CM | POA: Diagnosis not present

## 2023-02-18 DIAGNOSIS — Z885 Allergy status to narcotic agent status: Secondary | ICD-10-CM

## 2023-02-18 DIAGNOSIS — F32A Depression, unspecified: Secondary | ICD-10-CM | POA: Diagnosis present

## 2023-02-18 DIAGNOSIS — M3214 Glomerular disease in systemic lupus erythematosus: Secondary | ICD-10-CM

## 2023-02-18 DIAGNOSIS — I129 Hypertensive chronic kidney disease with stage 1 through stage 4 chronic kidney disease, or unspecified chronic kidney disease: Secondary | ICD-10-CM | POA: Diagnosis not present

## 2023-02-18 DIAGNOSIS — I25118 Atherosclerotic heart disease of native coronary artery with other forms of angina pectoris: Secondary | ICD-10-CM | POA: Diagnosis present

## 2023-02-18 DIAGNOSIS — Z86711 Personal history of pulmonary embolism: Secondary | ICD-10-CM

## 2023-02-18 DIAGNOSIS — I10 Essential (primary) hypertension: Secondary | ICD-10-CM | POA: Diagnosis not present

## 2023-02-18 DIAGNOSIS — R6889 Other general symptoms and signs: Secondary | ICD-10-CM | POA: Diagnosis not present

## 2023-02-18 DIAGNOSIS — Z8249 Family history of ischemic heart disease and other diseases of the circulatory system: Secondary | ICD-10-CM | POA: Diagnosis not present

## 2023-02-18 DIAGNOSIS — M25552 Pain in left hip: Secondary | ICD-10-CM | POA: Diagnosis not present

## 2023-02-18 DIAGNOSIS — Z0389 Encounter for observation for other suspected diseases and conditions ruled out: Secondary | ICD-10-CM | POA: Diagnosis not present

## 2023-02-18 DIAGNOSIS — Z7952 Long term (current) use of systemic steroids: Secondary | ICD-10-CM | POA: Diagnosis not present

## 2023-02-18 DIAGNOSIS — Y990 Civilian activity done for income or pay: Secondary | ICD-10-CM

## 2023-02-18 DIAGNOSIS — Z8616 Personal history of COVID-19: Secondary | ICD-10-CM

## 2023-02-18 DIAGNOSIS — Z79899 Other long term (current) drug therapy: Secondary | ICD-10-CM | POA: Diagnosis not present

## 2023-02-18 DIAGNOSIS — S72002A Fracture of unspecified part of neck of left femur, initial encounter for closed fracture: Secondary | ICD-10-CM | POA: Diagnosis present

## 2023-02-18 DIAGNOSIS — Z6830 Body mass index (BMI) 30.0-30.9, adult: Secondary | ICD-10-CM | POA: Diagnosis not present

## 2023-02-18 DIAGNOSIS — Z043 Encounter for examination and observation following other accident: Secondary | ICD-10-CM | POA: Diagnosis not present

## 2023-02-18 DIAGNOSIS — S79929A Unspecified injury of unspecified thigh, initial encounter: Secondary | ICD-10-CM | POA: Diagnosis not present

## 2023-02-18 DIAGNOSIS — M329 Systemic lupus erythematosus, unspecified: Secondary | ICD-10-CM | POA: Diagnosis not present

## 2023-02-18 DIAGNOSIS — S72012A Unspecified intracapsular fracture of left femur, initial encounter for closed fracture: Secondary | ICD-10-CM | POA: Diagnosis not present

## 2023-02-18 DIAGNOSIS — Z1152 Encounter for screening for COVID-19: Secondary | ICD-10-CM

## 2023-02-18 DIAGNOSIS — S79912A Unspecified injury of left hip, initial encounter: Secondary | ICD-10-CM | POA: Diagnosis not present

## 2023-02-18 DIAGNOSIS — M810 Age-related osteoporosis without current pathological fracture: Secondary | ICD-10-CM | POA: Diagnosis not present

## 2023-02-18 DIAGNOSIS — Z7901 Long term (current) use of anticoagulants: Secondary | ICD-10-CM

## 2023-02-18 DIAGNOSIS — Z743 Need for continuous supervision: Secondary | ICD-10-CM | POA: Diagnosis not present

## 2023-02-18 DIAGNOSIS — F329 Major depressive disorder, single episode, unspecified: Secondary | ICD-10-CM | POA: Diagnosis not present

## 2023-02-18 DIAGNOSIS — I825Y2 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: Secondary | ICD-10-CM | POA: Diagnosis not present

## 2023-02-18 DIAGNOSIS — E669 Obesity, unspecified: Secondary | ICD-10-CM | POA: Diagnosis not present

## 2023-02-18 DIAGNOSIS — Z95828 Presence of other vascular implants and grafts: Secondary | ICD-10-CM | POA: Diagnosis not present

## 2023-02-18 DIAGNOSIS — E785 Hyperlipidemia, unspecified: Secondary | ICD-10-CM | POA: Diagnosis present

## 2023-02-18 HISTORY — DX: Fracture of unspecified part of neck of left femur, initial encounter for closed fracture: S72.002A

## 2023-02-18 LAB — BASIC METABOLIC PANEL
Anion gap: 12 (ref 5–15)
BUN: 32 mg/dL — ABNORMAL HIGH (ref 8–23)
CO2: 23 mmol/L (ref 22–32)
Calcium: 8.5 mg/dL — ABNORMAL LOW (ref 8.9–10.3)
Chloride: 104 mmol/L (ref 98–111)
Creatinine, Ser: 1.61 mg/dL — ABNORMAL HIGH (ref 0.61–1.24)
GFR, Estimated: 48 mL/min — ABNORMAL LOW (ref >60)
Glucose, Bld: 86 mg/dL (ref 70–99)
Potassium: 3.5 mmol/L (ref 3.5–5.1)
Sodium: 139 mmol/L (ref 135–145)

## 2023-02-18 LAB — RESP PANEL BY RT-PCR (RSV, FLU A&B, COVID)  RVPGX2
Influenza A by PCR: NEGATIVE
Influenza B by PCR: NEGATIVE
Resp Syncytial Virus by PCR: NEGATIVE
SARS Coronavirus 2 by RT PCR: NEGATIVE

## 2023-02-18 LAB — CBC
HCT: 43.6 % (ref 39.0–52.0)
Hemoglobin: 13.8 g/dL (ref 13.0–17.0)
MCH: 29.4 pg (ref 26.0–34.0)
MCHC: 31.7 g/dL (ref 30.0–36.0)
MCV: 92.8 fL (ref 80.0–100.0)
Platelets: 165 10*3/uL (ref 150–400)
RBC: 4.7 MIL/uL (ref 4.22–5.81)
RDW: 14.8 % (ref 11.5–15.5)
WBC: 8.7 10*3/uL (ref 4.0–10.5)
nRBC: 0 % (ref 0.0–0.2)

## 2023-02-18 MED ORDER — METHOCARBAMOL 1000 MG/10ML IJ SOLN: 500.0000 mg | Freq: Four times a day (QID) | INTRAVENOUS | Status: DC | PRN

## 2023-02-18 MED ORDER — DOCUSATE SODIUM 100 MG PO CAPS
100.0000 mg | ORAL_CAPSULE | Freq: Two times a day (BID) | ORAL | Status: DC
  Administered 2023-02-19: 100 mg via ORAL
  Filled 2023-02-18: qty 1

## 2023-02-18 MED ORDER — AMLODIPINE BESYLATE 5 MG PO TABS
5.0000 mg | ORAL_TABLET | Freq: Every day | ORAL | Status: DC
  Filled 2023-02-18 (×2): qty 1

## 2023-02-18 MED ORDER — MYCOPHENOLATE MOFETIL 250 MG PO CAPS
1000.0000 mg | ORAL_CAPSULE | Freq: Two times a day (BID) | ORAL | Status: DC
  Administered 2023-02-19 – 2023-02-20 (×3): 1000 mg via ORAL
  Filled 2023-02-18 (×4): qty 4

## 2023-02-18 MED ORDER — MORPHINE SULFATE (PF) 4 MG/ML IV SOLN
4.0000 mg | Freq: Once | INTRAVENOUS | Status: AC
  Administered 2023-02-18: 4 mg via INTRAVENOUS
  Filled 2023-02-18: qty 1

## 2023-02-18 MED ORDER — LOSARTAN POTASSIUM 50 MG PO TABS
100.0000 mg | ORAL_TABLET | Freq: Every day | ORAL | Status: DC
  Administered 2023-02-19 – 2023-02-20 (×2): 100 mg via ORAL
  Filled 2023-02-18 (×2): qty 2

## 2023-02-18 MED ORDER — CEFAZOLIN SODIUM-DEXTROSE 2-4 GM/100ML-% IV SOLN
2.0000 g | Freq: Once | INTRAVENOUS | Status: AC
  Administered 2023-02-19: 2 g via INTRAVENOUS
  Filled 2023-02-18: qty 100

## 2023-02-18 MED ORDER — POLYETHYLENE GLYCOL 3350 17 G PO PACK: 17.0000 g | PACK | Freq: Every day | ORAL | Status: DC | PRN

## 2023-02-18 MED ORDER — ONDANSETRON HCL 4 MG/2ML IJ SOLN
4.0000 mg | Freq: Once | INTRAMUSCULAR | Status: AC
  Administered 2023-02-18: 4 mg via INTRAVENOUS
  Filled 2023-02-18: qty 2

## 2023-02-18 MED ORDER — ALBUTEROL SULFATE (2.5 MG/3ML) 0.083% IN NEBU: 2.5000 mg | INHALATION_SOLUTION | Freq: Four times a day (QID) | RESPIRATORY_TRACT | Status: DC | PRN

## 2023-02-18 MED ORDER — SERTRALINE HCL 50 MG PO TABS
25.0000 mg | ORAL_TABLET | Freq: Every day | ORAL | Status: DC
  Administered 2023-02-19 – 2023-02-20 (×2): 25 mg via ORAL
  Filled 2023-02-18 (×2): qty 1

## 2023-02-18 MED ORDER — MORPHINE SULFATE (PF) 2 MG/ML IV SOLN
2.0000 mg | INTRAVENOUS | Status: DC | PRN
  Administered 2023-02-19 (×2): 2 mg via INTRAVENOUS
  Filled 2023-02-18 (×2): qty 1

## 2023-02-18 MED ORDER — LACTATED RINGERS IV SOLN: INTRAVENOUS | Status: DC

## 2023-02-18 MED ORDER — MONTELUKAST SODIUM 10 MG PO TABS
10.0000 mg | ORAL_TABLET | Freq: Every day | ORAL | Status: DC
  Administered 2023-02-19 (×2): 10 mg via ORAL
  Filled 2023-02-18 (×2): qty 1

## 2023-02-18 MED ORDER — METHOCARBAMOL 500 MG PO TABS
500.0000 mg | ORAL_TABLET | Freq: Four times a day (QID) | ORAL | Status: DC | PRN
  Administered 2023-02-19 (×2): 500 mg via ORAL
  Filled 2023-02-18 (×3): qty 1

## 2023-02-18 MED ORDER — BISACODYL 5 MG PO TBEC: 5.0000 mg | DELAYED_RELEASE_TABLET | Freq: Every day | ORAL | Status: DC | PRN

## 2023-02-18 MED ORDER — ROSUVASTATIN CALCIUM 10 MG PO TABS
10.0000 mg | ORAL_TABLET | Freq: Every day | ORAL | Status: DC
  Administered 2023-02-19 (×2): 10 mg via ORAL
  Filled 2023-02-18 (×3): qty 1

## 2023-02-18 NOTE — Assessment & Plan Note (Signed)
Lab Results  Component Value Date   CREATININE 1.61 (H) 02/18/2023   CREATININE 1.37 (H) 10/01/2022   CREATININE 1.60 (H) 05/14/2022  Stable, avoid contrast and renally dose needed medications.

## 2023-02-18 NOTE — Assessment & Plan Note (Signed)
Ortho on call contacted Dr.Aberman. Eliquis to be held tonight.  Npo . Anticipate sx plan with IM nail.

## 2023-02-18 NOTE — Assessment & Plan Note (Signed)
Vitals:   02/18/23 1921 02/18/23 2200  BP: (!) 162/112 (!) 151/83  Again we will continue patient on amlodipine and losartan.

## 2023-02-18 NOTE — ED Notes (Signed)
Pt given drink and applesauce per admitting provider

## 2023-02-18 NOTE — Progress Notes (Signed)
I discussed patient with orthopedic surgeon on call, Dr. Karel Jarvis. I will plan to take over this patient's orthopedic care. Full consult note and discussion with patient to follow tomorrow AM prior to [place] for surgery tomorrow morning.  - NPO after midnight - Hold anticoagulation - Admit to Hospitalist team.

## 2023-02-18 NOTE — Assessment & Plan Note (Signed)
Continue patient on amlodipine, Cozaar, Crestor. Eliquis held tonight for anticipated procedure for left hip intramedullary nail.

## 2023-02-18 NOTE — Assessment & Plan Note (Signed)
Pt is closely followed by hematology for his hypercoagulable state.  Eliquis to be held tonight.  Resume post op 24 hours later.

## 2023-02-18 NOTE — Assessment & Plan Note (Signed)
Will continue patient on as needed albuterol. SpO2: 100 %

## 2023-02-18 NOTE — ED Provider Notes (Signed)
[facility] Provider Note    Event Date/Time   First MD Initiated Contact with Patient 02/18/23 2141     (approximate)  History   Chief Complaint: Fall and Hip Pain  HPI  Jonathon Snow is a 62 y.o. 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  According to the patient he had a fall today landing on his left side.  Patient has not been able to ambulate on the left hip ever since due to pain.  Denies hitting his head.  Denies LOC.  Patient states significant pain in the left hip worse with any attempted movement.  Physical Exam   Triage Vital Signs: ED Triage Vitals  Enc Vitals Group     BP 02/18/23 1921 (!) 162/112     Pulse Rate 02/18/23 1921 71     Resp 02/18/23 1921 (!) 22     Temp 02/18/23 1921 97.8 F (36.6 C)     Temp Source 02/18/23 1921 Oral     SpO2 02/18/23 1921 100 %     Weight 02/18/23 1917 240 lb (108.9 kg)     Height 02/18/23 1917 '6\' 2"'$  (1.88 m)     Head Circumference --      Peak Flow --      Pain Score 02/18/23 1917 8     Pain Loc --      Pain Edu? --      Excl. in [place]? --     Most recent vital signs: Vitals:   02/18/23 1921  BP: (!) 162/112  Pulse: 71  Resp: (!) 22  Temp: 97.8 F (36.6 C)  SpO2: 100%    General: Awake, no distress.  No signs of head trauma. CV:  Good peripheral perfusion.  Regular rate and rhythm  Resp:  Normal effort.  Equal breath sounds bilaterally.  Abd:  No distention.  Soft, nontender.  No rebound or guarding. Other:  Moderate left hip tenderness, pain with any attempted range of motion.  2+ DP pulse with sensation intact.   ED Results / Procedures / Treatments   RADIOLOGY  I have reviewed and interpreted the x-ray images.  Patient does have an impacted appearance to the left femoral neck. Radiology has read the x-ray as cortical step-off of the left femoral head neck junction suspicious  for impacted femoral neck fracture. CT confirms left femoral neck fracture/impaction.   MEDICATIONS ORDERED IN ED: Medications  morphine (PF) 4 MG/ML injection 4 mg (4 mg Intravenous Given 02/18/23 2152)  ondansetron (ZOFRAN) injection 4 mg (4 mg Intravenous Given 02/18/23 2152)     IMPRESSION / MDM / ASSESSMENT AND PLAN / ED COURSE  I reviewed the triage vital signs and the nursing notes.  Patient's presentation is most consistent with acute presentation with potential threat to life or bodily function.  Presents emergency department after a fall with left hip pain.  X-ray concerning for possible left hip fracture, CT scan ordered to confirm.  CT scan confirms subcapital/impaction fracture.  I spoke to Dr. Karel Jarvis of orthopedics who states the patient needs to be admitted to the hospital service and that they would likely operate tomorrow to do a percutaneous pinning/screw.  We will check labs EKG and COVID test for preop purposes.  We will treat pain and admit to the hospital service.  Patient agreeable to plan of care.  Patient CBC shows no concerning findings, normal white blood  cell count, chemistry shows mild renal insufficiency otherwise reassuring.  Patient will be admitted to the hospital service for further workup and treatment.  FINAL CLINICAL IMPRESSION(S) / ED DIAGNOSES   left hip fracture    Note:  This document was prepared using Dragon voice recognition software and may include unintentional dictation errors.   Harvest Dark, MD 02/18/23 2306

## 2023-02-18 NOTE — ED Triage Notes (Signed)
Pt brought in via ems from work  pt fell and has left hip pain.  Pt states WC.  Pt states oil on floor/humidity and pt slipped and fell while walking.  Pt unable to ambulate.  Pt alert  speech clear.

## 2023-02-18 NOTE — H&P (Addendum)
History and Physical    Chief Complaint: Left hip fracture.   HISTORY OF PRESENT ILLNESS: Admission requested by Dr. Kerman Passey for patient for fall and left hip pain. Patient had a mechanical fall due to slippery floor and is unable to ambulate since then. When patient fell today he landed on his left side and since then he has not been able to even move because of the pain.  No reported loss of consciousness or hitting his head.  And pain in the left hip is worse with movement. X-ray and CT scan confirms subcapital left hip fracture with impaction. EDMD contacted orthopedics who requested admission to hospitalist service and they would consult with plan for percutaneous pin and screw tomorrow morning. Chart review shows that patient is followed by hematology history of cerebral venous sinus thrombosis, DVT and PE in the setting of leg injury trauma and lupus, patient has IVC filter in place. Patient is being followed by lupus with question for lupus nephritis and is followed by nephrology Dr. Holley Raring. Patient is also followed by vascular surgery for his chronic DVTs and lower extremity swelling.   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   Overview:  superior sagittal sinus, left transverse sinus and cortical veins    COVID-19 virus infection 12/2020   Depression    DVT (deep venous thrombosis) (HCC)    Dyspnea    GERD (gastroesophageal reflux disease)    Heel spur, left 02/16/2019   Heel spur, right 02/16/2019   Herpes zoster infection of lumbar region 02/20/2020   Hyperlipidemia    Hypertension    Lupus ([place])    Lymphedema 10/07/2018   Morbid obesity ([place])    Opiate abuse, episodic ([place]) 02/26/2018   Osteoporosis    Pneumonia    PONV (postoperative nausea and vomiting)    Postphlebitic syndrome with ulcer, left ([place]) 11/18/2016   Presence of IVC filter 03/22/2020   Removed   Pulmonary  embolism ([place])    Renal disorder    Stage III     Review of Systems  Musculoskeletal:  Positive for gait problem.       Left Hip Pain.   All other systems reviewed and are negative.  Allergies  Allergen Reactions   Enalapril Other (See Comments)    Unknown reaction   Vicodin [Hydrocodone-Acetaminophen] Hives and Rash    Severe headaches (also) NAME BRAND ONLY PER PT CAN TAKE GENERIC   Past Surgical History:  Procedure Laterality Date   ANKLE SURGERY Right    BACK SURGERY     BRONCHIAL WASHINGS N/A 11/01/2021   Procedure: BRONCHIAL WASHINGS;  Surgeon: [name], MD;  Location: [facility] ORS;  Service: Thoracic;  Laterality: N/A;   COLONOSCOPY WITH PROPOFOL N/A 05/28/2020   Procedure: COLONOSCOPY WITH PROPOFOL;  Surgeon: Jonathon Bellows, MD;  Location: [facility] ENDOSCOPY;  Service: Endoscopy;  Laterality: N/A;   CYST EXCISION  92 or 93    Liver cyst removal [facility]   FLEXIBLE BRONCHOSCOPY N/A 11/01/2021   Procedure: FLEXIBLE BRONCHOSCOPY;  Surgeon: [name], MD;  Location: [facility] ORS;  Service: Thoracic;  Laterality: N/A;   I & D EXTREMITY Right 04/29/2017   Procedure: IRRIGATION AND DEBRIDEMENT EXTREMITY;  Surgeon: Clayburn Pert, MD;  Location: [facility] ORS;  Service: General;  Laterality: Right;   IRRIGATION AND DEBRIDEMENT ABSCESS Left 04/29/2017   Procedure: IRRIGATION AND DEBRIDEMENT Scrotal ABSCESS;  Surgeon: Clayburn Pert, MD;  Location: [facility] ORS;  Service: General;  Laterality: Left;       MEDICATIONS: Current Outpatient Medications  Medication Instructions   albuterol (VENTOLIN HFA) 108 (90 Base) MCG/ACT inhaler 2 puffs, Inhalation, Every 6 hours PRN   amLODipine (NORVASC) 5 mg, Oral, Daily   apixaban (ELIQUIS) 5 mg, Oral, 2 times daily   cholecalciferol (VITAMIN D3) 1,000 Units, Oral, Daily   levocetirizine (XYZAL) 5 mg, Oral, Every evening   losartan (COZAAR) 100 mg, Oral, Daily   meclizine (ANTIVERT) 12.5-25 mg, Oral, 3 times daily PRN   montelukast (SINGULAIR)  10 mg, Oral, Daily at bedtime   Multiple Vitamins-Minerals (MULTIVITAMIN WITH MINERALS) tablet 1 tablet, Oral, Daily   mycophenolate (CELLCEPT) 1,000 mg, Oral, 2 times daily   Olopatadine-Mometasone (RYALTRIS) 665-25 MCG/ACT SUSP 2 sprays, Nasal, 2 times daily PRN   predniSONE (DELTASONE) 10 MG tablet 1 tablet, Oral, Daily   promethazine-dextromethorphan (PROMETHAZINE-DM) 6.25-15 MG/5ML syrup 5 mLs, Oral, 4 times daily PRN   rosuvastatin (CRESTOR) 10 mg, Oral, Daily   sertraline (ZOLOFT) 25 mg, Oral, Daily   sildenafil (REVATIO) 20 MG tablet TAKE 3 TO 5 TABLETS BY MOUTH AS NEEDED 30 MIN PRIOR TO INTERCOURSE    ED Course: Pt in Ed pt is A/O. Vitals:   02/18/23 1917 02/18/23 1921 02/18/23 2200  BP:  (!) 162/112 (!) 151/83  Pulse:  71 94  Resp:  (!) 22   Temp:  97.8 F (36.6 C)   TempSrc:  Oral   SpO2:  100% 100%  Weight: 108.9 kg    Height: '6\' 2"'$  (1.88 m)      No intake/output data recorded. SpO2: 100 % Blood work in ed shows: Results for orders placed or performed during the hospital encounter of 02/18/23 (from the past 24 hour(s))  CBC     Status: None   Collection Time: 02/18/23 10:14 PM  Result Value Ref Range   WBC 8.7 4.0 - 10.5 K/uL   RBC 4.70 4.22 - 5.81 MIL/uL   Hemoglobin 13.8 13.0 - 17.0 g/dL   HCT 43.6 39.0 - 52.0 %   MCV 92.8 80.0 - 100.0 fL   MCH 29.4 26.0 - 34.0 pg   MCHC 31.7 30.0 - 36.0 g/dL   RDW 14.8 11.5 - 15.5 %   Platelets 165 150 - 400 K/uL   nRBC 0.0 0.0 - 0.2 %  Basic metabolic panel     Status: Abnormal   Collection Time: 02/18/23 10:14 PM  Result Value Ref Range   Sodium 139 135 - 145 mmol/L   Potassium 3.5 3.5 - 5.1 mmol/L   Chloride 104 98 - 111 mmol/L   CO2 23 22 - 32 mmol/L   Glucose, Bld 86 70 - 99 mg/dL   BUN 32 (H) 8 - 23 mg/dL   Creatinine, Ser 1.61 (H) 0.61 - 1.24 mg/dL   Calcium 8.5 (L) 8.9 - 10.3 mg/dL   GFR, Estimated 48 (L) >60 mL/min   Anion gap 12 5 - 15   None. Unresulted Labs (From admission, onward)     Start      Ordered   02/19/23 [id number]  Basic metabolic panel  Tomorrow morning,   STAT        02/18/23 2254   02/19/23 0500  CBC  Tomorrow morning,   STAT        02/18/23 2254   02/18/23 2249  Type and screen [place]  Once,   STAT       Comments: [place]  02/18/23 2254   02/18/23 2248  HIV Antibody (routine testing w rflx)  (HIV Antibody (Routine testing w reflex) panel)  Once,   URGENT        02/18/23 2254   02/18/23 2159  Resp panel by RT-PCR (RSV, Flu A&B, Covid) Anterior Nasal Swab  Once,   URGENT        02/18/23 2158           Pt has received : Orders Placed This Encounter  Procedures   Resp panel by RT-PCR (RSV, Flu A&B, Covid) Anterior Nasal Swab    Standing Status:   Standing    Number of Occurrences:   1   DG Hip Unilat W or Wo Pelvis 2-3 Views Left    Standing Status:   Standing    Number of Occurrences:   1    Order Specific Question:   Reason for Exam (SYMPTOM  OR DIAGNOSIS REQUIRED)    Answer:   fall   DG Chest 1 View    Standing Status:   Standing    Number of Occurrences:   1    Order Specific Question:   Symptom/Reason for Exam    Answer:   Fall, initial encounter [id number]   CT Hip Left Wo Contrast    Standing Status:   Standing    Number of Occurrences:   1   CBC    Standing Status:   Standing    Number of Occurrences:   1   Basic metabolic panel    Standing Status:   Standing    Number of Occurrences:   1   HIV Antibody (routine testing w rflx)    Standing Status:   Standing    Number of Occurrences:   1   Basic metabolic panel    Standing Status:   Standing    Number of Occurrences:   1   CBC    Standing Status:   Standing    Number of Occurrences:   1   Diet NPO time specified    Standing Status:   Standing    Number of Occurrences:   1   Diet NPO time specified    Standing Status:   Standing    Number of Occurrences:   1   Informed Consent Details: Physician/Practitioner Attestation; Transcribe to  consent form and obtain patient signature    Standing Status:   Standing    Number of Occurrences:   1    Order Specific Question:   Physician/Practitioner attestation of informed consent for procedure/surgical case    Answer:   I, the physician/practitioner, attest that I have discussed with the patient the benefits, risks, side effects, alternatives, likelihood of achieving goals and potential problems during recovery for the procedure that I have provided informed consent.    Order Specific Question:   Procedure    Answer:   Left hip percutaneous pinning    Order Specific Question:   Physician/Practitioner performing the procedure    Answer:   Leim Fabry    Order Specific Question:   Indication/Reason    Answer:   Left hip fracture   SCDs    Standing Status:   Standing    Number of Occurrences:   1    Order Specific Question:   Laterality    Answer:   Bilateral   Vital signs every hour x 4, then every 4 hours    Standing Status:   Standing    Number of Occurrences:  1   Neurovascular checks every hour x 4, then every 4 hours    Standing Status:   Standing    Number of Occurrences:   1   Bed with an overhead patient helper or overhead frame with a trapeze bar    Standing Status:   Standing    Number of Occurrences:   1   Apply ice to affected area    Ice to fractured hip/femur until surgery. If non-operative, ice to fractured hip/femur x 24 hours, then as needed for pain.    Standing Status:   Standing    Number of Occurrences:   1   Elevate heels off of bed    Standing Status:   Standing    Number of Occurrences:   1   Turn cough deep breathe    Standing Status:   Standing    Number of Occurrences:   1   Incentive spirometry    Standing Status:   Standing    Number of Occurrences:   1   Bed rest with HOB to 30 degrees    Standing Status:   Standing    Number of Occurrences:   1   Intake and output    Standing Status:   Standing    Number of Occurrences:   1   If  diabetic or glucose greater than '140mg'$ /dl, notify physician to place Gylcemic Control (SSI) Order Set    Standing Status:   Standing    Number of Occurrences:   20   Initiate Oral Care Protocol    Standing Status:   Standing    Number of Occurrences:   1   Initiate Carrier Fluid Protocol    Standing Status:   Standing    Number of Occurrences:   1   Document [place] Opioid-Induced Sedation Scale (POSS) per protocol (see sidebar report)    Standing Status:   Standing    Number of Occurrences:   1   Full code    Standing Status:   Standing    Number of Occurrences:   1    Order Specific Question:   By:    Answer:   Other   Consult to hospitalist    Standing Status:   Standing    Number of Occurrences:   1    Order Specific Question:   Place call to:    Answer:   [facility]    Order Specific Question:   Reason for Consult    Answer:   Admit    Order Specific Question:   Diagnosis/Clinical Info for Consult:    Answer:   left hip Fx   Consult to Transition of Care Team    Standing Status:   Standing    Number of Occurrences:   1    Order Specific Question:   Reason for Consult:    Answer:   Home Health / DME Needs   Oxygen therapy Mode or (Route): Nasal cannula; Liters Per Minute: 2    Then PRN to keep O2 Sats >92% (or for confusion using same parameters)    Standing Status:   Standing    Number of Occurrences:   20    Order Specific Question:   Mode or (Route)    Answer:   Nasal cannula    Order Specific Question:   Liters Per Minute    Answer:   2   Pulse oximetry, continuous    Continuous for 24 hours, then every 4 hours.  Standing Status:   Standing    Number of Occurrences:   1   Pulse oximetry, every 4 hours    Standing Status:   Standing    Number of Occurrences:   37   ED EKG    Standing Status:   Standing    Number of Occurrences:   1    Order Specific Question:   Reason for Exam    Answer:   Chest Pain   Type and screen [place]     Standing Status:   Standing    Number of Occurrences:   1   Admit to Inpatient (patient's expected length of stay will be greater than 2 midnights or inpatient only procedure)    Standing Status:   Standing    Number of Occurrences:   1    Order Specific Question:   Hospital Area    Answer:   [place] [100120]    Order Specific Question:   Level of Care    Answer:   Telemetry Medical [104]    Order Specific Question:   Covid Evaluation    Answer:   Asymptomatic - no recent exposure (last 10 days) testing not required    Order Specific Question:   Diagnosis    Answer:   Closed left hip fracture, initial encounter [facility]) [id number]    Order Specific Question:   Admitting Physician    Answer:   Cherylann Ratel    Order Specific Question:   Attending Physician    Answer:   Cherylann Ratel    Order Specific Question:   Certification:    Answer:   I certify this patient will need inpatient services for at least 2 midnights    Order Specific Question:   Estimated Length of Stay    Answer:   3    Meds ordered this encounter  Medications   morphine (PF) 4 MG/ML injection 4 mg   ondansetron (ZOFRAN) injection 4 mg   ceFAZolin (ANCEF) IVPB 2g/100 mL premix    Order Specific Question:   Antibiotic Indication:    Answer:   Surgical Prophylaxis   albuterol (VENTOLIN HFA) 108 (90 Base) MCG/ACT inhaler 2 puff   amLODipine (NORVASC) tablet 5 mg   losartan (COZAAR) tablet 100 mg   montelukast (SINGULAIR) tablet 10 mg   mycophenolate (CELLCEPT) tablet 1,000 mg   sertraline (ZOLOFT) tablet 25 mg   rosuvastatin (CRESTOR) tablet 10 mg   morphine (PF) 2 MG/ML injection 2 mg   OR Linked Order Group    methocarbamol (ROBAXIN) tablet 500 mg    methocarbamol (ROBAXIN) 500 mg in dextrose 5 % 50 mL IVPB   docusate sodium (COLACE) capsule 100 mg   bisacodyl (DULCOLAX) EC tablet 5 mg   lactated ringers infusion   polyethylene glycol  (MIRALAX / GLYCOLAX) packet 17 g     Admission Imaging : CT Hip Left Wo Contrast  Result Date: 02/18/2023 CLINICAL DATA:  Trauma EXAM: CT OF THE LEFT HIP WITHOUT CONTRAST TECHNIQUE: Multidetector CT imaging of the left hip was performed according to the standard protocol. Multiplanar CT image reconstructions were also generated. RADIATION DOSE REDUCTION: This exam was performed according to the departmental dose-optimization program which includes automated exposure control, adjustment of the mA and/or kV according to patient size and/or use of iterative reconstruction technique. COMPARISON:  X-ray same day FINDINGS: Bones/Joint/Cartilage The bones are osteopenic. There is subtle  cortical step-off at the femoral head neck junction laterally and anteriorly suspicious for nondisplaced subcapital fracture. No dislocation. There are mild degenerative changes of the left hip with joint space narrowing and osteophyte formation. Ligaments Suboptimally assessed by CT. Muscles and Tendons Negative. Soft tissues No focal hematoma.  There is sigmoid colon diverticulosis. IMPRESSION: 1. Subtle nondisplaced subcapital left femoral neck fracture. 2. Osteopenia. 3. Mild degenerative changes of the left hip. 4. Sigmoid colon diverticulosis. Electronically Signed   By: Ronney Asters M.D.   On: 02/18/2023 20:53   DG Chest 1 View  Result Date: 02/18/2023 CLINICAL DATA:  Fall, initial encounter. EXAM: CHEST  1 VIEW COMPARISON:  Radiograph 09/09/2021 FINDINGS: The cardiomediastinal contours are normal. The lungs are clear. Pulmonary vasculature is normal. No consolidation, pleural effusion, or pneumothorax. No acute osseous abnormalities are seen. IMPRESSION: No acute findings. Electronically Signed   By: Keith Rake M.D.   On: 02/18/2023 20:09   DG Hip Unilat W or Wo Pelvis 2-3 Views Left  Result Date: 02/18/2023 CLINICAL DATA:  Fall with left hip pain. EXAM: DG HIP (WITH OR WITHOUT PELVIS) 2-3V LEFT COMPARISON:   Abdominal radiograph from acute abdomen series 02/15/2019 FINDINGS: Cortical step-off of the femoral head neck junction suspicious for impacted femoral neck fracture. Femoral head is seated in the acetabulum. The pubic rami are intact. No symphyseal or sacroiliac joint diastasis. Chronic changes of both sacroiliac joints. There is bilateral hip joint space narrowing and acetabular spurring. Surgical hardware in the lower lumbar spine is partially included. IMPRESSION: Cortical step-off of the left femoral head neck junction suspicious for impacted femoral neck fracture. Electronically Signed   By: Keith Rake M.D.   On: 02/18/2023 20:08   Physical Examination: Vitals:   02/18/23 1917 02/18/23 1921 02/18/23 2200  BP:  (!) 162/112 (!) 151/83  Pulse:  71 94  Temp:  97.8 F (36.6 C)   Resp:  (!) 22   Height: '6\' 2"'$  (1.88 m)    Weight: 108.9 kg    SpO2:  100% 100%  TempSrc:  Oral   BMI (Calculated): 30.8     Physical Exam Vitals and nursing note reviewed.  Constitutional:      General: He is not in acute distress.    Appearance: He is not ill-appearing, toxic-appearing or diaphoretic.  HENT:     Head: Normocephalic and atraumatic.     Right Ear: Hearing and external ear normal.     Left Ear: Hearing and external ear normal.     Nose: Nose normal. No nasal deformity.     Mouth/Throat:     Lips: Pink.     Mouth: Mucous membranes are moist.     Tongue: No lesions.     Pharynx: Oropharynx is clear.  Eyes:     Extraocular Movements: Extraocular movements intact.  Cardiovascular:     Rate and Rhythm: Normal rate and regular rhythm.     Pulses: Normal pulses.     Heart sounds: Normal heart sounds.  Pulmonary:     Effort: Pulmonary effort is normal.     Breath sounds: Normal breath sounds.  Abdominal:     General: Bowel sounds are normal. There is no distension.     Palpations: Abdomen is soft. There is no mass.     Tenderness: There is no abdominal tenderness. There is no guarding.      Hernia: No hernia is present.  Musculoskeletal:        General: Swelling and tenderness present.  Right lower leg: Edema present.     Left lower leg: Edema present.  Skin:    General: Skin is warm.  Neurological:     General: No focal deficit present.     Mental Status: He is alert and oriented to person, place, and time.     Cranial Nerves: Cranial nerves 2-12 are intact.     Motor: Motor function is intact.  Psychiatric:        Attention and Perception: Attention normal.        Mood and Affect: Mood normal.        Speech: Speech normal.        Behavior: Behavior normal. Behavior is cooperative.        Cognition and Memory: Cognition normal.       Assessment and Plan: * Closed left hip fracture, initial encounter ([place]) Ortho on call contacted Dr.Aberman. Eliquis to be held tonight.  Npo . Anticipate sx plan with IM nail.   Coronary artery disease of native artery of native heart with stable angina pectoris ([place]) Continue patient on amlodipine, Cozaar, Crestor. Eliquis held tonight for anticipated procedure for left hip intramedullary nail.  Stage 3a chronic kidney disease (HCC) Lab Results  Component Value Date   CREATININE 1.61 (H) 02/18/2023   CREATININE 1.37 (H) 10/01/2022   CREATININE 1.60 (H) 05/14/2022  Stable, avoid contrast and renally dose needed medications.   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  Eliquis to be held tonight.  Resume post op 24 hours later.   Primary hypertension Vitals:   02/18/23 1921 02/18/23 2200  BP: (!) 162/112 (!) 151/83  Again we will continue patient on amlodipine and losartan.    COPD, moderate ([place]) Will continue patient on as needed albuterol. SpO2: 100 %   DVT prophylaxis:  SCD's pt is on eliquis.    Code Status:   Full code    Family Communication:  [name]  (Daughter)  [phone]   Disposition Plan:  Home.    Consults called:  Ortho: Dr. Karel Jarvis.   Admission status: Inpatient.    Unit/ Expected LOS: Med tele. / 2-3 days.    Para Skeans MD [facility]  6 PM- 2 AM. Please contact me via secure Chat 6 PM-2 AM. [phone]( Pager ) To contact the [facility] Attending or Consulting provider [place] or covering provider during after hours [place], for this patient.   Check the care team in [facility] and look for a) attending/consulting [facility] provider listed and b) the [facility] team listed Log into [email or web address] and use [place]'s universal password to access. If you do not have the password, please contact the hospital operator. Locate the [facility] provider you are looking for under [facility] and page to a number that you can be directly reached. If you still have difficulty reaching the provider, please page the [facility] (Director on Call) for the Hospitalists listed on amion for assistance. [email or web address] 02/18/2023, 11:02 PM

## 2023-02-19 ENCOUNTER — Inpatient Hospital Stay: Payer: No Typology Code available for payment source | Admitting: Certified Registered"

## 2023-02-19 ENCOUNTER — Encounter
Admission: EM | Disposition: A | Discharge status: Home Health | Payer: Self-pay | Source: Home / Self Care | Attending: Student in an Organized Health Care Education/Training Program

## 2023-02-19 ENCOUNTER — Inpatient Hospital Stay: Payer: No Typology Code available for payment source

## 2023-02-19 ENCOUNTER — Encounter: Payer: Self-pay | Admitting: Internal Medicine

## 2023-02-19 ENCOUNTER — Other Ambulatory Visit: Payer: Self-pay

## 2023-02-19 DIAGNOSIS — M329 Systemic lupus erythematosus, unspecified: Secondary | ICD-10-CM | POA: Diagnosis not present

## 2023-02-19 DIAGNOSIS — S72002A Fracture of unspecified part of neck of left femur, initial encounter for closed fracture: Secondary | ICD-10-CM | POA: Diagnosis not present

## 2023-02-19 DIAGNOSIS — I10 Essential (primary) hypertension: Secondary | ICD-10-CM | POA: Diagnosis not present

## 2023-02-19 HISTORY — PX: HIP PINNING,CANNULATED: SHX1758

## 2023-02-19 LAB — BASIC METABOLIC PANEL
Anion gap: 10 (ref 5–15)
BUN: 30 mg/dL — ABNORMAL HIGH (ref 8–23)
CO2: 20 mmol/L — ABNORMAL LOW (ref 22–32)
Calcium: 7.9 mg/dL — ABNORMAL LOW (ref 8.9–10.3)
Chloride: 108 mmol/L (ref 98–111)
Creatinine, Ser: 1.48 mg/dL — ABNORMAL HIGH (ref 0.61–1.24)
GFR, Estimated: 53 mL/min — ABNORMAL LOW (ref >60)
Glucose, Bld: 105 mg/dL — ABNORMAL HIGH (ref 70–99)
Potassium: 3.7 mmol/L (ref 3.5–5.1)
Sodium: 138 mmol/L (ref 135–145)

## 2023-02-19 LAB — CBC
HCT: 41.7 % (ref 39.0–52.0)
Hemoglobin: 13.1 g/dL (ref 13.0–17.0)
MCH: 29.2 pg (ref 26.0–34.0)
MCHC: 31.4 g/dL (ref 30.0–36.0)
MCV: 92.9 fL (ref 80.0–100.0)
Platelets: 151 10*3/uL (ref 150–400)
RBC: 4.49 MIL/uL (ref 4.22–5.81)
RDW: 15 % (ref 11.5–15.5)
WBC: 5.8 10*3/uL (ref 4.0–10.5)
nRBC: 0 % (ref 0.0–0.2)

## 2023-02-19 LAB — HIV ANTIBODY (ROUTINE TESTING W REFLEX): HIV Screen 4th Generation wRfx: NONREACTIVE

## 2023-02-19 LAB — TYPE AND SCREEN
ABO/RH(D): A POS
Antibody Screen: NEGATIVE

## 2023-02-19 SURGERY — Surgical Case
Anesthesia: *Unknown

## 2023-02-19 SURGERY — FIXATION, FEMUR, NECK, PERCUTANEOUS, USING SCREW
Anesthesia: General | Site: Hip | Laterality: Left

## 2023-02-19 MED ORDER — PHENYLEPHRINE HCL-NACL 20-0.9 MG/250ML-% IV SOLN
INTRAVENOUS | Status: DC | PRN
  Administered 2023-02-19: 60 ug/min via INTRAVENOUS

## 2023-02-19 MED ORDER — KETAMINE HCL 10 MG/ML IJ SOLN
INTRAMUSCULAR | Status: DC | PRN
  Administered 2023-02-19 (×2): 20 mg via INTRAVENOUS

## 2023-02-19 MED ORDER — HYDROMORPHONE HCL 1 MG/ML IJ SOLN: 0.5000 mg | Freq: Once | INTRAMUSCULAR | Status: AC

## 2023-02-19 MED ORDER — CEFAZOLIN SODIUM-DEXTROSE 2-3 GM-%(50ML) IV SOLR
INTRAVENOUS | Status: DC | PRN
  Administered 2023-02-19: 2 g via INTRAVENOUS

## 2023-02-19 MED ORDER — HYDROMORPHONE HCL 1 MG/ML IJ SOLN
INTRAMUSCULAR | Status: AC
  Administered 2023-02-19: 0.5 mg via INTRAVENOUS
  Filled 2023-02-19: qty 0.5

## 2023-02-19 MED ORDER — ACETAMINOPHEN 500 MG PO TABS
1000.0000 mg | ORAL_TABLET | Freq: Three times a day (TID) | ORAL | Status: DC
  Administered 2023-02-19 – 2023-02-20 (×3): 1000 mg via ORAL
  Filled 2023-02-19 (×3): qty 2

## 2023-02-19 MED ORDER — ALBUTEROL SULFATE HFA 108 (90 BASE) MCG/ACT IN AERS
INHALATION_SPRAY | RESPIRATORY_TRACT | Status: DC | PRN
  Administered 2023-02-19: 4 via RESPIRATORY_TRACT
  Administered 2023-02-19: 2 via RESPIRATORY_TRACT

## 2023-02-19 MED ORDER — APIXABAN 5 MG PO TABS
5.0000 mg | ORAL_TABLET | Freq: Two times a day (BID) | ORAL | Status: DC
  Administered 2023-02-19 – 2023-02-20 (×2): 5 mg via ORAL
  Filled 2023-02-19 (×2): qty 1

## 2023-02-19 MED ORDER — OXYCODONE HCL 5 MG PO TABS
ORAL_TABLET | ORAL | Status: AC
  Filled 2023-02-19: qty 1

## 2023-02-19 MED ORDER — ONDANSETRON HCL 4 MG/2ML IJ SOLN: 4.0000 mg | Freq: Four times a day (QID) | INTRAMUSCULAR | Status: DC | PRN

## 2023-02-19 MED ORDER — OXYCODONE HCL 5 MG PO TABS
5.0000 mg | ORAL_TABLET | ORAL | Status: DC | PRN
  Administered 2023-02-19: 5 mg via ORAL
  Administered 2023-02-20: 10 mg via ORAL
  Administered 2023-02-20: 5 mg via ORAL
  Filled 2023-02-19: qty 2
  Filled 2023-02-19 (×2): qty 1

## 2023-02-19 MED ORDER — BUPIVACAINE HCL (PF) 0.5 % IJ SOLN
INTRAMUSCULAR | Status: AC
  Filled 2023-02-19: qty 30

## 2023-02-19 MED ORDER — SODIUM CHLORIDE 0.9 % IV SOLN: INTRAVENOUS | Status: DC

## 2023-02-19 MED ORDER — METHOCARBAMOL 1000 MG/10ML IJ SOLN: 500.0000 mg | Freq: Four times a day (QID) | INTRAVENOUS | Status: DC | PRN

## 2023-02-19 MED ORDER — TRAMADOL HCL 50 MG PO TABS: 50.0000 mg | ORAL_TABLET | Freq: Four times a day (QID) | ORAL | Status: DC | PRN

## 2023-02-19 MED ORDER — POLYETHYLENE GLYCOL 3350 17 G PO PACK
17.0000 g | PACK | Freq: Every day | ORAL | Status: DC
  Administered 2023-02-19: 17 g via ORAL
  Filled 2023-02-19 (×2): qty 1

## 2023-02-19 MED ORDER — FENTANYL CITRATE (PF) 100 MCG/2ML IJ SOLN
25.0000 ug | INTRAMUSCULAR | Status: DC | PRN
  Administered 2023-02-19 (×2): 50 ug via INTRAVENOUS

## 2023-02-19 MED ORDER — SUGAMMADEX SODIUM 200 MG/2ML IV SOLN
INTRAVENOUS | Status: DC | PRN
  Administered 2023-02-19: 200 mg via INTRAVENOUS

## 2023-02-19 MED ORDER — SENNOSIDES-DOCUSATE SODIUM 8.6-50 MG PO TABS: 1.0000 | ORAL_TABLET | Freq: Every evening | ORAL | Status: DC | PRN

## 2023-02-19 MED ORDER — SCOPOLAMINE 1 MG/3DAYS TD PT72
MEDICATED_PATCH | TRANSDERMAL | Status: AC
  Administered 2023-02-19: 1.5 mg via TRANSDERMAL
  Filled 2023-02-19: qty 1

## 2023-02-19 MED ORDER — BUPIVACAINE LIPOSOME 1.3 % IJ SUSP
INTRAMUSCULAR | Status: DC | PRN
  Administered 2023-02-19: 25 mL

## 2023-02-19 MED ORDER — LIDOCAINE HCL (CARDIAC) PF 100 MG/5ML IV SOSY
PREFILLED_SYRINGE | INTRAVENOUS | Status: DC | PRN
  Administered 2023-02-19: 80 mg via INTRAVENOUS

## 2023-02-19 MED ORDER — ONDANSETRON HCL 4 MG/2ML IJ SOLN
INTRAMUSCULAR | Status: DC | PRN
  Administered 2023-02-19: 4 mg via INTRAVENOUS

## 2023-02-19 MED ORDER — MUPIROCIN 2 % EX OINT
1.0000 | TOPICAL_OINTMENT | Freq: Two times a day (BID) | CUTANEOUS | Status: DC
  Administered 2023-02-19 – 2023-02-20 (×2): 1 via NASAL
  Filled 2023-02-19 (×3): qty 22

## 2023-02-19 MED ORDER — ROCURONIUM BROMIDE 100 MG/10ML IV SOLN
INTRAVENOUS | Status: DC | PRN
  Administered 2023-02-19: 50 mg via INTRAVENOUS

## 2023-02-19 MED ORDER — OXYCODONE HCL 5 MG PO TABS
5.0000 mg | ORAL_TABLET | Freq: Once | ORAL | Status: AC | PRN
  Administered 2023-02-19: 5 mg via ORAL

## 2023-02-19 MED ORDER — DEXAMETHASONE SODIUM PHOSPHATE 10 MG/ML IJ SOLN
INTRAMUSCULAR | Status: DC | PRN
  Administered 2023-02-19: 10 mg via INTRAVENOUS

## 2023-02-19 MED ORDER — FLEET ENEMA 7-19 GM/118ML RE ENEM: 1.0000 | ENEMA | Freq: Once | RECTAL | Status: DC | PRN

## 2023-02-19 MED ORDER — DOCUSATE SODIUM 100 MG PO CAPS: 100.0000 mg | ORAL_CAPSULE | Freq: Two times a day (BID) | ORAL | Status: DC

## 2023-02-19 MED ORDER — ACETAMINOPHEN 10 MG/ML IV SOLN
INTRAVENOUS | Status: DC | PRN
  Administered 2023-02-19: 1000 mg via INTRAVENOUS

## 2023-02-19 MED ORDER — FENTANYL CITRATE (PF) 100 MCG/2ML IJ SOLN
INTRAMUSCULAR | Status: AC
  Filled 2023-02-19: qty 2

## 2023-02-19 MED ORDER — SCOPOLAMINE 1 MG/3DAYS TD PT72: 1.0000 | MEDICATED_PATCH | TRANSDERMAL | Status: DC

## 2023-02-19 MED ORDER — ONDANSETRON HCL 4 MG/2ML IJ SOLN: 4.0000 mg | Freq: Once | INTRAMUSCULAR | Status: DC | PRN

## 2023-02-19 MED ORDER — CEFAZOLIN SODIUM-DEXTROSE 2-4 GM/100ML-% IV SOLN
2.0000 g | Freq: Four times a day (QID) | INTRAVENOUS | Status: AC
  Administered 2023-02-19 – 2023-02-20 (×3): 2 g via INTRAVENOUS
  Filled 2023-02-19 (×3): qty 100

## 2023-02-19 MED ORDER — LACTATED RINGERS IV SOLN: INTRAVENOUS | Status: DC

## 2023-02-19 MED ORDER — ACETAMINOPHEN 10 MG/ML IV SOLN
INTRAVENOUS | Status: AC
  Filled 2023-02-19: qty 100

## 2023-02-19 MED ORDER — METOCLOPRAMIDE HCL 5 MG PO TABS: 5.0000 mg | ORAL_TABLET | Freq: Three times a day (TID) | ORAL | Status: DC | PRN

## 2023-02-19 MED ORDER — MORPHINE SULFATE (PF) 2 MG/ML IV SOLN
INTRAVENOUS | Status: AC
  Administered 2023-02-19: 2 mg via INTRAVENOUS
  Filled 2023-02-19: qty 1

## 2023-02-19 MED ORDER — PREDNISONE 10 MG PO TABS
10.0000 mg | ORAL_TABLET | Freq: Every day | ORAL | Status: DC
  Administered 2023-02-19 – 2023-02-20 (×2): 10 mg via ORAL
  Filled 2023-02-19 (×2): qty 1

## 2023-02-19 MED ORDER — ACETAMINOPHEN 325 MG PO TABS: 650.0000 mg | ORAL_TABLET | Freq: Four times a day (QID) | ORAL | Status: DC | PRN

## 2023-02-19 MED ORDER — SEVOFLURANE IN SOLN
RESPIRATORY_TRACT | Status: AC
  Filled 2023-02-19: qty 250

## 2023-02-19 MED ORDER — BUPIVACAINE LIPOSOME 1.3 % IJ SUSP
INTRAMUSCULAR | Status: AC
  Filled 2023-02-19: qty 20

## 2023-02-19 MED ORDER — BISACODYL 10 MG RE SUPP: 10.0000 mg | Freq: Every day | RECTAL | Status: DC | PRN

## 2023-02-19 MED ORDER — METOCLOPRAMIDE HCL 5 MG/ML IJ SOLN: 5.0000 mg | Freq: Three times a day (TID) | INTRAMUSCULAR | Status: DC | PRN

## 2023-02-19 MED ORDER — PROPOFOL 10 MG/ML IV BOLUS
INTRAVENOUS | Status: AC
  Filled 2023-02-19: qty 20

## 2023-02-19 MED ORDER — KETAMINE HCL 50 MG/5ML IJ SOSY
PREFILLED_SYRINGE | INTRAMUSCULAR | Status: AC
  Filled 2023-02-19: qty 5

## 2023-02-19 MED ORDER — PHENYLEPHRINE HCL-NACL 20-0.9 MG/250ML-% IV SOLN
INTRAVENOUS | Status: AC
  Filled 2023-02-19: qty 250

## 2023-02-19 MED ORDER — ONDANSETRON HCL 4 MG PO TABS: 4.0000 mg | ORAL_TABLET | Freq: Four times a day (QID) | ORAL | Status: DC | PRN

## 2023-02-19 MED ORDER — ACETAMINOPHEN 10 MG/ML IV SOLN: 1000.0000 mg | Freq: Once | INTRAVENOUS | Status: DC | PRN

## 2023-02-19 MED ORDER — 0.9 % SODIUM CHLORIDE (POUR BTL) OPTIME
TOPICAL | Status: DC | PRN
  Administered 2023-02-19: 500 mL

## 2023-02-19 MED ORDER — FENTANYL CITRATE (PF) 100 MCG/2ML IJ SOLN
INTRAMUSCULAR | Status: DC | PRN
  Administered 2023-02-19: 25 ug via INTRAVENOUS

## 2023-02-19 MED ORDER — OXYCODONE HCL 5 MG/5ML PO SOLN: 5.0000 mg | Freq: Once | ORAL | Status: AC | PRN

## 2023-02-19 MED ORDER — PHENYLEPHRINE HCL (PRESSORS) 10 MG/ML IV SOLN
INTRAVENOUS | Status: DC | PRN
  Administered 2023-02-19: 80 ug via INTRAVENOUS
  Administered 2023-02-19 (×3): 160 ug via INTRAVENOUS

## 2023-02-19 MED ORDER — PROPOFOL 10 MG/ML IV BOLUS
INTRAVENOUS | Status: DC | PRN
  Administered 2023-02-19: 130 mg via INTRAVENOUS

## 2023-02-19 MED ORDER — ALBUTEROL SULFATE HFA 108 (90 BASE) MCG/ACT IN AERS
INHALATION_SPRAY | RESPIRATORY_TRACT | Status: AC
  Filled 2023-02-19: qty 6.7

## 2023-02-19 MED ORDER — VITAMIN D 25 MCG (1000 UNIT) PO TABS
1000.0000 [IU] | ORAL_TABLET | Freq: Every day | ORAL | Status: DC
  Administered 2023-02-19 – 2023-02-20 (×2): 1000 [IU] via ORAL
  Filled 2023-02-19 (×2): qty 1

## 2023-02-19 MED ORDER — HYDROMORPHONE HCL 1 MG/ML IJ SOLN: 0.2000 mg | INTRAMUSCULAR | Status: DC | PRN

## 2023-02-19 MED ORDER — MORPHINE SULFATE (PF) 2 MG/ML IV SOLN: 2.0000 mg | INTRAVENOUS | Status: DC | PRN

## 2023-02-19 MED ORDER — OXYCODONE HCL 5 MG PO TABS: 2.5000 mg | ORAL_TABLET | ORAL | Status: DC | PRN

## 2023-02-19 MED ORDER — METHOCARBAMOL 500 MG PO TABS: 500.0000 mg | ORAL_TABLET | Freq: Four times a day (QID) | ORAL | Status: DC | PRN

## 2023-02-19 MED ORDER — LORATADINE 10 MG PO TABS: 5.0000 mg | ORAL_TABLET | Freq: Every evening | ORAL | Status: DC

## 2023-02-19 MED ORDER — MIDAZOLAM HCL 2 MG/2ML IJ SOLN
INTRAMUSCULAR | Status: AC
  Filled 2023-02-19: qty 2

## 2023-02-19 MED ORDER — KETOROLAC TROMETHAMINE 15 MG/ML IJ SOLN
7.5000 mg | Freq: Four times a day (QID) | INTRAMUSCULAR | Status: DC
  Administered 2023-02-19 – 2023-02-20 (×3): 7.5 mg via INTRAVENOUS
  Filled 2023-02-19 (×3): qty 1

## 2023-02-19 MED ORDER — ONDANSETRON HCL 4 MG/2ML IJ SOLN
INTRAMUSCULAR | Status: AC
  Administered 2023-02-19: 4 mg via INTRAVENOUS
  Filled 2023-02-19: qty 2

## 2023-02-19 MED ORDER — CEFAZOLIN SODIUM-DEXTROSE 2-4 GM/100ML-% IV SOLN
INTRAVENOUS | Status: AC
  Filled 2023-02-19: qty 100

## 2023-02-19 MED ORDER — ONDANSETRON HCL 4 MG/2ML IJ SOLN: 4.0000 mg | Freq: Once | INTRAMUSCULAR | Status: AC

## 2023-02-19 MED ORDER — OXYCODONE HCL 5 MG PO TABS: 5.0000 mg | ORAL_TABLET | Freq: Four times a day (QID) | ORAL | Status: DC | PRN

## 2023-02-19 MED ORDER — MIDAZOLAM HCL 2 MG/2ML IJ SOLN
INTRAMUSCULAR | Status: DC | PRN
  Administered 2023-02-19: 2 mg via INTRAVENOUS

## 2023-02-19 SURGICAL SUPPLY — 41 items
BIT DRILL 4.9 CANNULATED (BIT) ×1
BIT DRILL CANN QC 4.9 LRG (BIT) IMPLANT
BLADE SURG 15 STRL LF DISP TIS (BLADE) ×1 IMPLANT
BLADE SURG 15 STRL SS (BLADE) ×1
CHLORAPREP W/TINT 26 (MISCELLANEOUS) ×1 IMPLANT
DRAPE 3/4 80X56 (DRAPES) ×1 IMPLANT
DRAPE SURG 17X11 SM STRL (DRAPES) ×2 IMPLANT
DRAPE U-SHAPE 47X51 STRL (DRAPES) ×2 IMPLANT
DRILL BIT CANNULATED 4.9 (BIT) ×1
DRSG OPSITE POSTOP 3X4 (GAUZE/BANDAGES/DRESSINGS) ×1 IMPLANT
ELECT REM PT RETURN 9FT ADLT (ELECTROSURGICAL) ×1
ELECTRODE REM PT RTRN 9FT ADLT (ELECTROSURGICAL) ×1 IMPLANT
GAUZE XEROFORM 1X8 LF (GAUZE/BANDAGES/DRESSINGS) ×1 IMPLANT
GLOVE BIOGEL PI IND STRL 8 (GLOVE) ×1 IMPLANT
GLOVE SURG SYN 7.5  E (GLOVE) ×2
GLOVE SURG SYN 7.5 E (GLOVE) ×2 IMPLANT
GLOVE SURG SYN 7.5 PF PI (GLOVE) ×2 IMPLANT
GOWN STRL REUS W/ TWL LRG LVL3 (GOWN DISPOSABLE) ×1 IMPLANT
GOWN STRL REUS W/ TWL XL LVL3 (GOWN DISPOSABLE) ×1 IMPLANT
GOWN STRL REUS W/TWL LRG LVL3 (GOWN DISPOSABLE) ×1
GOWN STRL REUS W/TWL XL LVL3 (GOWN DISPOSABLE) ×1
GUIDEWIRE ASNIS 3.2 NONCAL (WIRE) IMPLANT
KIT TURNOVER CYSTO (KITS) ×1 IMPLANT
MANIFOLD NEPTUNE II (INSTRUMENTS) ×1 IMPLANT
MAT ABSORB  FLUID 56X50 GRAY (MISCELLANEOUS) ×1
MAT ABSORB FLUID 56X50 GRAY (MISCELLANEOUS) ×2 IMPLANT
NEEDLE HYPO 22GX1.5 SAFETY (NEEDLE) ×1 IMPLANT
NS IRRIG 500ML POUR BTL (IV SOLUTION) ×1 IMPLANT
PACK HIP COMPR (MISCELLANEOUS) ×1 IMPLANT
SCREW ASNIS 100MM (Screw) IMPLANT
SCREW ASNIS 6.5X105MM (Screw) ×2 IMPLANT
SCREW CANN ASNIS 6.5X105 STRL (Screw) IMPLANT
STAPLER SKIN PROX 35W (STAPLE) ×1 IMPLANT
SUT VIC AB 0 CT1 36 (SUTURE) ×1 IMPLANT
SUT VIC AB 2-0 CT1 27 (SUTURE) ×1
SUT VIC AB 2-0 CT1 TAPERPNT 27 (SUTURE) ×1 IMPLANT
SYR 30ML LL (SYRINGE) ×1 IMPLANT
TAPE CLOTH 3X10 WHT NS LF (GAUZE/BANDAGES/DRESSINGS) ×1 IMPLANT
TRAP FLUID SMOKE EVACUATOR (MISCELLANEOUS) ×1 IMPLANT
WASHER SCREW MATTA SS 13.0X1.5 (Washer) IMPLANT
WATER STERILE IRR 500ML POUR (IV SOLUTION) ×1 IMPLANT

## 2023-02-19 NOTE — Progress Notes (Signed)
PROGRESS NOTE  Jonathon Snow    DOB: 10/25/1961, 62 y.o.  [id number]    Code Status: Full Code   DOA: 02/18/2023   LOS: 1   Brief hospital course  Jonathon Snow is a 62 y.o. male with a PMH significant for COPD, HTN, DVT, CKDIII, AD, PE, right lug nodule, cystic liver disease, SLE, HLD, venous insufficiency, osteoporosis, MDD.  They presented from home to the ED on 02/18/2023 with fall. Slipped on slipper floor. Denies head injury.  In the ED, it was found that they had stable vital signs with mild hypertension.  Significant findings included normal CBC, unremarkable metabolic panel. Xray left hip: Cortical step-off of the left femoral head neck junction suspicious for impacted femoral neck fracture. CT left hip confirmed: Subtle nondisplaced subcapital left femoral neck fracture.  They were initially treated with analgesia and immobilization. Ortho surgery was consulted.    Patient was admitted to medicine service for further workup and management of hip fracture as outlined in detail below.  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 Pain well controlled immediately post-op. 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  - BMP am to monitor post-op - continue home med, cellcept - continue chronic steroids- '10mg'$  prednisone  HTN- well controlled - continue home meds,  amlodipine, losartan  COPD- asymptomatic - continue home meds, albuterol, montelukast  H/o DVT- on chronic eliquis. Held for surgery - restart when able  MDD- not in acute exacerbation - continue home meds, sertraline  Body mass index is 30.81 kg/m.  VTE ppx: SCDs Start: 02/19/23 1501 apixaban (ELIQUIS) tablet 5 mg  Diet:     Diet   Diet regular Room service appropriate? Yes; Fluid consistency: Thin   Consultants: Ortho surgery  Subjective 02/19/23    Pt reports doing well post-op. Pain is well controlled currently. Has concerns about care after discharge   Objective   Vitals:   02/19/23 1426 02/19/23 1431 02/19/23 1432 02/19/23 1529  BP:  124/87 124/87 137/78  Pulse: 68 81 80 77  Resp: '11 13 16 17  '$ Temp:   (!) 97.1 F (36.2 C) 97.7 F (36.5 C)  TempSrc:      SpO2: 94% (!) 89% 95% 95%  Weight:      Height:        Intake/Output Summary (Last 24 hours) at 02/19/2023 1541 Last data filed at 02/19/2023 1340 Gross per 24 hour  Intake 800 ml  Output --  Net 800 ml   Filed Weights   02/18/23 1917  Weight: 108.9 kg    Physical Exam:  General: awake, alert, NAD HEENT: atraumatic, clear conjunctiva, anicteric sclera, MMM, hearing grossly normal Respiratory: normal respiratory effort. Cardiovascular: quick capillary refill, normal S1/S2, RRR, no JVD, murmurs Nervous: A&O x3. no gross focal neurologic deficits, normal speech Extremities: honey comb dressing clean/dry over incision on left thigh. Mild bruising/swelling.  Skin: dry, intact,  normal temperature, normal color. No rashes, lesions or ulcers on exposed skin Psychiatry: normal mood, congruent affect  Labs   I have personally reviewed the following labs and imaging studies CBC    Component Value Date/Time   WBC 5.8 02/19/2023 0409   RBC 4.49 02/19/2023 0409   HGB 13.1 02/19/2023 0409   HGB 10.4 (L) 04/10/2015 1036   HCT 41.7 02/19/2023 0409   HCT 31.4 (L) 04/10/2015 1036   PLT 151 02/19/2023 0409    PLT 179 04/10/2015 1036   MCV 92.9 02/19/2023 0409   MCV 92 04/10/2015 1036   MCH 29.2 02/19/2023 0409   MCHC 31.4 02/19/2023 0409   RDW 15.0 02/19/2023 0409   RDW 17.5 (H) 04/10/2015 1036   LYMPHSABS 0.4 (L) 10/01/2022 1031   LYMPHSABS 0.5 (L) 03/28/2015 1041   MONOABS 0.4 10/01/2022 1031   MONOABS 0.5 03/28/2015 1041   EOSABS 0.0 10/01/2022 1031   EOSABS 0.1 03/28/2015 1041   BASOSABS 0.0 10/01/2022 1031   BASOSABS 0.0 03/28/2015 1041      Latest Ref Rng & Units 02/19/2023    4:09 AM 02/18/2023   10:14 PM 10/01/2022   10:31 AM  BMP  Glucose 70 - 99 mg/dL 105  86  108   BUN 8 - 23 mg/dL 30  32  24   Creatinine 0.61 - 1.24 mg/dL 1.48  1.61  1.37   Sodium 135 - 145 mmol/L 138  139  137   Potassium 3.5 - 5.1 mmol/L 3.7  3.5  4.2   Chloride 98 - 111 mmol/L 108  104  107   CO2 22 - 32 mmol/L '20  23  24   '$ Calcium 8.9 - 10.3 mg/dL 7.9  8.5  8.8     DG HIP UNILAT WITH PELVIS 2-3 VIEWS LEFT  Result Date: 02/19/2023 CLINICAL DATA:  Fluoro guidance provided EXAM: DG HIP (WITH OR WITHOUT PELVIS) 2-3V LEFT; DG C-ARM 1-60 MIN-NO REPORT FINDINGS: Dose: 36.7 mGy Fluoro time 78s IMPRESSION: C-arm fluoro guidance provided. Electronically Signed   By: Sammie Bench M.D.   On: 02/19/2023 13:45   DG C-Arm 1-60 Min-No Report  Result Date: 02/19/2023 Fluoroscopy was utilized by the requesting physician.  No radiographic interpretation.   DG C-Arm 1-60 Min-No Report  Result Date: 02/19/2023 Fluoroscopy was utilized by the requesting physician.  No radiographic interpretation.   CT Hip Left Wo Contrast  Result Date: 02/18/2023 CLINICAL DATA:  Trauma EXAM: CT OF THE LEFT HIP WITHOUT CONTRAST TECHNIQUE: Multidetector CT imaging of the left hip was performed according to the standard protocol. Multiplanar CT image reconstructions were also generated. RADIATION DOSE REDUCTION: This exam was performed according to the departmental dose-optimization program which includes automated exposure  control, adjustment of the mA and/or kV according to patient size and/or use of iterative reconstruction technique. COMPARISON:  X-ray same day FINDINGS: Bones/Joint/Cartilage The bones are osteopenic. There is subtle cortical step-off at the femoral head neck junction laterally and anteriorly suspicious for nondisplaced subcapital fracture. No dislocation. There are mild degenerative changes of the left hip with joint space narrowing and osteophyte formation. Ligaments Suboptimally assessed by CT. Muscles and Tendons Negative. Soft tissues No focal hematoma.  There is sigmoid colon diverticulosis. IMPRESSION: 1. Subtle nondisplaced subcapital left femoral neck fracture. 2. Osteopenia. 3. Mild degenerative changes of the left hip. 4. Sigmoid colon diverticulosis. Electronically Signed   By: Ronney Asters M.D.   On: 02/18/2023 20:53   DG Chest 1 View  Result Date: 02/18/2023  CLINICAL DATA:  Fall, initial encounter. EXAM: CHEST  1 VIEW COMPARISON:  Radiograph 09/09/2021 FINDINGS: The cardiomediastinal contours are normal. The lungs are clear. Pulmonary vasculature is normal. No consolidation, pleural effusion, or pneumothorax. No acute osseous abnormalities are seen. IMPRESSION: No acute findings. Electronically Signed   By: Keith Rake M.D.   On: 02/18/2023 20:09   DG Hip Unilat W or Wo Pelvis 2-3 Views Left  Result Date: 02/18/2023 CLINICAL DATA:  Fall with left hip pain. EXAM: DG HIP (WITH OR WITHOUT PELVIS) 2-3V LEFT COMPARISON:  Abdominal radiograph from acute abdomen series 02/15/2019 FINDINGS: Cortical step-off of the femoral head neck junction suspicious for impacted femoral neck fracture. Femoral head is seated in the acetabulum. The pubic rami are intact. No symphyseal or sacroiliac joint diastasis. Chronic changes of both sacroiliac joints. There is bilateral hip joint space narrowing and acetabular spurring. Surgical hardware in the lower lumbar spine is partially included. IMPRESSION:  Cortical step-off of the left femoral head neck junction suspicious for impacted femoral neck fracture. Electronically Signed   By: Keith Rake M.D.   On: 02/18/2023 20:08    Disposition Plan & Communication  Patient status: Inpatient  Admitted From: Home Planned disposition location: Home Anticipated discharge date: 3/2 pending ortho clearance   Family Communication: none     Author: Richarda Osmond, DO [facility] 02/19/2023, 3:41 PM   Available by Epic secure chat 7AM-7PM. If 7PM-7AM, please contact night-coverage.  [facility] contact information found on [email or web address].

## 2023-02-19 NOTE — Plan of Care (Signed)
  Problem: Education: Goal: Knowledge of General Education information will improve Description Including pain rating scale, medication(s)/side effects and non-pharmacologic comfort measures Outcome: Progressing   Problem: Activity: Goal: Risk for activity intolerance will decrease Outcome: Progressing   Problem: Pain Managment: Goal: General experience of comfort will improve Outcome: Progressing   Problem: Safety: Goal: Ability to remain free from injury will improve Outcome: Progressing   

## 2023-02-19 NOTE — Anesthesia Procedure Notes (Signed)
Procedure Name: Intubation Date/Time: 02/19/2023 12:42 PM  Performed by: Lerry Liner, CRNAPre-anesthesia Checklist: Patient identified, Emergency Drugs available, Suction available and Patient being monitored Patient Re-evaluated:Patient Re-evaluated prior to induction Oxygen Delivery Method: Circle system utilized Preoxygenation: Pre-oxygenation with 100% oxygen Induction Type: IV induction Ventilation: Mask ventilation without difficulty Laryngoscope Size: McGraph and 4 Grade View: Grade I Tube type: Oral Tube size: 7.5 mm Number of attempts: 1 Airway Equipment and Method: Stylet and Oral airway Placement Confirmation: ETT inserted through vocal cords under direct vision, positive ETCO2 and breath sounds checked- equal and bilateral Secured at: 22 cm Tube secured with: Tape Dental Injury: Teeth and Oropharynx as per pre-operative assessment

## 2023-02-19 NOTE — H&P (Signed)
H&P reviewed. No significant changes noted.  

## 2023-02-19 NOTE — Transfer of Care (Signed)
Immediate Anesthesia Transfer of Care Note  Patient: Jonathon Snow  Procedure(s) Performed: PERCUTANEOUS FIXATION OF FEMORAL NECK (Left: Hip)  Patient Location: PACU  Anesthesia Type:General  Level of Consciousness: drowsy  Airway & Oxygen Therapy: Patient Spontanous Breathing and Patient connected to face mask oxygen  Post-op Assessment: Report given to RN  Post vital signs: stable  Last Vitals:  Vitals Value Taken Time  BP 135/83 02/19/23 1345  Temp    Pulse 74 02/19/23 1348  Resp 21 02/19/23 1348  SpO2 100 % 02/19/23 1348  Vitals shown include unvalidated device data.  Last Pain:  Vitals:   02/19/23 0928  TempSrc:   PainSc: 9          Complications: No notable events documented.

## 2023-02-19 NOTE — Anesthesia Postprocedure Evaluation (Signed)
Anesthesia Post Note  Patient: Jonathon Snow  Procedure(s) Performed: PERCUTANEOUS FIXATION OF FEMORAL NECK (Left: Hip)  Patient location during evaluation: PACU Anesthesia Type: General Level of consciousness: awake and alert, oriented and patient cooperative Pain management: pain level controlled Vital Signs Assessment: post-procedure vital signs reviewed and stable Respiratory status: spontaneous breathing, nonlabored ventilation and respiratory function stable Cardiovascular status: blood pressure returned to baseline and stable Postop Assessment: adequate PO intake Anesthetic complications: no   No notable events documented.   Last Vitals:  Vitals:   02/19/23 1426 02/19/23 1431  BP:  124/87  Pulse: 68 81  Resp: 11 13  Temp:    SpO2: 94% (!) 89%    Last Pain:  Vitals:   02/19/23 1431  TempSrc:   PainSc: [place]

## 2023-02-19 NOTE — Op Note (Signed)
DATE OF SURGERY: 02/19/2023  PREOPERATIVE DIAGNOSIS: Left valgus impacted femoral neck fracture  POSTOPERATIVE DIAGNOSIS: Left valgus impacted femoral neck fracture  PROCEDURE: Percutaneous pinning of Left femoral neck fracture  SURGEON: Cato Mulligan, MD  ASSISTANTS: none  EBL: 25 cc  COMPONENTS:  Stryker 6.44m cannulated screws x 3 with washers (1039m [id number]: BarrJAHSEAN CAILa 61 y57. male who sustained a valgus impacted femoral neck fracture after a fall at work. Risks and benefits of percutaneous pinning were explained to the patient and family. Risks include but are not limited to bleeding, infection, injury to tissues, nerves, vessels, DVT/PE, malunion/nonunion, hardware failure, and risks of anesthesia. The patient understands these risks, have completed an informed consent, and wish to proceed.   PROCEDURE:  The patient was brought into the operating room. After administering general anesthesia, the patient was placed in the supine position on the Hana table. The uninjured leg was extended while the injured lower extremity was placed in a neutral position with care taken to not displace the fracture during positioning. The lateral aspects of the operative hip and thigh were prepped with ChloraPrep solution before being draped sterilely. IV antibiotics were administered. A timeout was performed to verify the appropriate surgical site, patient, and procedure.   The greater trochanter was identified and an approximately 5 cm incision was made over the lateral aspect of the proximal femur. The incision was carried down through the subcutaneous tissues to expose the IT band. This was split at the proximal portion of the incision and the vastus lateralis was split in line with its fibers. The lateral aspect of the femur was cleared of soft tissue. Under fluoroscopic guidance, a guidewire was placed along the inferior aspect of the femoral neck into the head while  ensuring the start point on the lateral cortex was not below the level of the lesser trochanter. A parallel guide was used to place two additional guidepins (superior posterior and superior anterior). Position of all pins was verified fluoroscopically in both the AP and lateral views. A measuring device was used the measure appropriate screw length. The guidepins were drilled with a 3.2mm 61mll. Appropriately sized screws were advanced starting with the inferior screw, then superior posterior, then superior anterior. Screws were sequentially tightened. Hardware position and bony alignment was confirmed fluoroscopically with AP and lateral views.   The wounds were irrigated thoroughly with sterile saline solution. The IT band was closed with 0-Vicryl. Deep fat sutures were also placed with 0-Vicryl. The subcutaneous tissues were closed using 2-0 Vicryl interrupted sutures. The skin was closed using staples. Sterile occlusive dressing was applied. The patient was then transferred to the recovery room in satisfactory condition after tolerating the procedure well.  POSTOPERATIVE PLAN: The patient will be WBAT on the operative extremity. Resume baseline home Eliquis on POD#1. Ancef x 24 hours. PT/OT on POD#1.

## 2023-02-19 NOTE — ED Notes (Signed)
Awaiting med verification by pharmacy

## 2023-02-19 NOTE — ED Notes (Signed)
Lovena Le from OR transports Patient OTF to OR

## 2023-02-19 NOTE — Anesthesia Preprocedure Evaluation (Addendum)
Anesthesia Evaluation  Patient identified by MRN, date of birth, ID band Patient awake    Reviewed: Allergy & Precautions, NPO status , Patient's Chart, lab work & pertinent test results  History of Anesthesia Complications (+) PONV and history of anesthetic complications  Airway Mallampati: IV   Neck ROM: Full    Dental  (+) Partial [place] :   Pulmonary asthma , COPD   Pulmonary exam normal breath sounds clear to auscultation       Cardiovascular hypertension, + CAD  Normal cardiovascular exam Rhythm:Regular Rate:Normal  Hx DVT and PE on Eliquis, last dose in AM on 02/18/23  ECG 02/18/23: Sinus rhythm Abnormal R-wave progression, early transition  Myocardial perfusion 09/25/21:    The study is low risk.   There is no evidence for ischemia   Left ventricular function is normal. LVEF 69%   There are coronary artery calcifications in the LAD and LCX arteries.    Neuro/Psych  PSYCHIATRIC DISORDERS  Depression    Chronic back pain    GI/Hepatic ,GERD  ,,  Endo/Other  Obesity   Renal/GU Renal disease (stage III CKD)     Musculoskeletal  (+) Arthritis ,  Lupus    Abdominal   Peds  Hematology negative hematology ROS (+)   Anesthesia Other Findings   Reproductive/Obstetrics                             Anesthesia Physical Anesthesia Plan  ASA: 3  Anesthesia Plan: General   Post-op Pain Management:    Induction: Intravenous  PONV Risk Score and Plan: 3 and Ondansetron, Dexamethasone and Treatment may vary due to age or medical condition  Airway Management Planned: Oral ETT  Additional Equipment:   Intra-op Plan:   Post-operative Plan: Extubation in OR  Informed Consent: I have reviewed the patients History and Physical, chart, labs and discussed the procedure including the risks, benefits and alternatives for the proposed anesthesia with the patient or authorized  representative who has indicated his/her understanding and acceptance.     Dental advisory given  Plan Discussed with: CRNA  Anesthesia Plan Comments: (Patient consented for risks of anesthesia including but not limited to:  - adverse reactions to medications - damage to eyes, teeth, lips or other oral mucosa - nerve damage due to positioning  - sore throat or hoarseness - damage to heart, brain, nerves, lungs, other parts of body or loss of life  Informed patient about role of CRNA in peri- and intra-operative care.  Patient voiced understanding.)        Anesthesia Quick Evaluation

## 2023-02-19 NOTE — Consult Note (Signed)
ORTHOPAEDIC CONSULTATION  REQUESTING PHYSICIAN: Dr. Girard Cooter  Chief Complaint:   L hip pain  History of Present Illness: Jonathon Snow is a 62 y.o. male who had a fall at work yesterday after he slipped on a slippery floor.  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  The patient noted immediate hip pain and inability to ambulate.  The patient ambulates unassisted at baseline.  His job involves walking on concrete floors with steel toed shoes all day.   The patient lives at home alone in a [place] house.  He does have family that lives locally. Pain is rated a 10 out of 10 in severity.  Pain is improved with rest and immobilization.  Pain is worse with any sort of movement.  X-rays in the emergency department show a left valgus impacted femoral neck fracture.  Of note, he does have a history of lupus with associated kidney disease.  He does take CellCept and prednisone for this.  Additionally, he has a history of multiple prior DVT and PE.  He is on Eliquis for this daily.  Last Eliquis dose was yesterday morning.  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   Overview:  superior sagittal sinus, left transverse sinus and cortical veins    COVID-19 virus infection 12/2020   Depression    DVT (deep venous thrombosis) (HCC)    Dyspnea    GERD (gastroesophageal reflux disease)    Heel spur, left 02/16/2019   Heel spur, right 02/16/2019   Herpes zoster infection of lumbar region 02/20/2020   Hyperlipidemia    Hypertension    Lupus ([place])    Lymphedema 10/07/2018   Morbid obesity ([place])    Opiate abuse, episodic ([place]) 02/26/2018   Osteoporosis    Pneumonia    PONV (postoperative nausea and vomiting)    Postphlebitic syndrome with ulcer, 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   Presence of IVC filter 03/22/2020   Removed   Pulmonary embolism ([place])    Renal disorder     Stage III   Past Surgical History:  Procedure Laterality Date   ANKLE SURGERY Right    BACK SURGERY     BRONCHIAL WASHINGS N/A 11/01/2021   Procedure: BRONCHIAL WASHINGS;  Surgeon: [name], MD;  Location: [facility] ORS;  Service: Thoracic;  Laterality: N/A;   COLONOSCOPY WITH PROPOFOL N/A 05/28/2020   Procedure: COLONOSCOPY WITH PROPOFOL;  Surgeon: Jonathon Bellows, MD;  Location: [facility];  Service: Endoscopy;  Laterality: N/A;   CYST EXCISION  92 or 93    Liver cyst removal [facility]   FLEXIBLE BRONCHOSCOPY N/A 11/01/2021   Procedure: FLEXIBLE BRONCHOSCOPY;  Surgeon: [name], MD;  Location: [facility] ORS;  Service: Thoracic;  Laterality: N/A;   I & D EXTREMITY Right 04/29/2017   Procedure: IRRIGATION AND DEBRIDEMENT EXTREMITY;  Surgeon: Clayburn Pert, MD;  Location: [facility] ORS;  Service: General;  Laterality: Right;   IRRIGATION AND DEBRIDEMENT ABSCESS Left 04/29/2017   Procedure: IRRIGATION AND DEBRIDEMENT Scrotal ABSCESS;  Surgeon: Clayburn Pert, MD;  Location: [facility] ORS;  Service: General;  Laterality: Left;   Social History   Socioeconomic History   Marital status: Divorced    Spouse name: Not on file   Number of children: Not on file   Years of education: Not on file   Highest education level: Not on file  Occupational History   Not on file  Tobacco Use   Smoking status: Never   Smokeless tobacco:  Never  Vaping Use   Vaping Use: Never used  Substance and Sexual Activity   Alcohol use: No   Drug use: No    Comment: PT DENIES   Sexual activity: Yes  Other Topics Concern   Not on file  Social History Narrative   Not on file   Social Determinants of Health   Financial Resource Strain: Not on file  Food Insecurity: No Food Insecurity (02/19/2023)   Hunger Vital Sign    Worried About Running Out of Food in the Last Year: Never true    Ran Out of Food in the Last Year: Never true  Transportation Needs: No Transportation Needs (02/19/2023)   PRAPARE -  Hydrologist (Medical): No    Lack of Transportation (Non-Medical): No  Physical Activity: Not on file  Stress: Not on file  Social Connections: Not on file   Family History  Problem Relation Age of Onset   Hypertension Father    Heart disease Father    Clotting disorder Mother    Kidney disease Brother    Heart attack Maternal Grandmother    Heart attack Maternal Grandfather    Heart attack Paternal Grandfather    Allergies  Allergen Reactions   Enalapril Other (See Comments)    Unknown reaction   Vicodin [Hydrocodone-Acetaminophen] Hives and Rash    Severe headaches (also) NAME BRAND ONLY PER PT CAN TAKE GENERIC   Prior to Admission medications   Medication Sig Start Date End Date Taking? Authorizing Provider  amLODipine (NORVASC) 5 MG tablet Take 1 tablet (5 mg total) by mouth daily. 03/04/22  Yes Delsa Grana, PA-C  apixaban (ELIQUIS) 5 MG TABS tablet Take 1 tablet (5 mg total) by mouth 2 (two) times daily. 10/01/22  Yes Cammie Sickle, MD  cholecalciferol (VITAMIN D3) 25 MCG (1000 UNIT) tablet Take 1,000 Units by mouth daily.   Yes [provider]  levocetirizine (XYZAL) 5 MG tablet Take 1 tablet (5 mg total) by mouth every evening. 12/08/22  Yes Delsa Grana, PA-C  losartan (COZAAR) 100 MG tablet Take 1 tablet (100 mg total) by mouth daily. 08/15/22  Yes Delsa Grana, PA-C  montelukast (SINGULAIR) 10 MG tablet Take 1 tablet (10 mg total) by mouth at bedtime. 12/08/22  Yes Delsa Grana, PA-C  Multiple Vitamins-Minerals (MULTIVITAMIN WITH MINERALS) tablet Take 1 tablet by mouth daily.   Yes [provider]  mycophenolate (CELLCEPT) 500 MG tablet Take 1,000 mg by mouth 2 (two) times daily.   Yes [provider]  predniSONE (DELTASONE) 10 MG tablet Take 1 tablet by mouth daily. 11/28/22  Yes [provider]  rosuvastatin (CRESTOR) 10 MG tablet Take 1 tablet (10 mg total) by mouth daily. 09/09/22  Yes Delsa Grana,  PA-C  sertraline (ZOLOFT) 25 MG tablet Take 1 tablet (25 mg total) by mouth daily. 09/09/22  Yes Delsa Grana, PA-C  albuterol (VENTOLIN HFA) 108 (90 Base) MCG/ACT inhaler Inhale 2 puffs into the lungs every 6 (six) hours as needed for wheezing or shortness of breath. 10/29/22   Bo Merino, FNP  meclizine (ANTIVERT) 25 MG tablet Take 0.5-1 tablets (12.5-25 mg total) by mouth 3 (three) times daily as needed for dizziness. 09/09/22   Delsa Grana, PA-C  Olopatadine-Mometasone (RYALTRIS) [phone] MCG/ACT SUSP Place 2 sprays into the nose 2 (two) times daily as needed. 12/08/22   Delsa Grana, PA-C  promethazine-dextromethorphan (PROMETHAZINE-DM) 6.25-15 MG/5ML syrup Take 5 mLs by mouth 4 (four) times daily as needed  for cough. 12/10/22   Mecum, Erin E, PA-C  sildenafil (REVATIO) 20 MG tablet TAKE 3 TO 5 TABLETS BY MOUTH AS NEEDED 30 MIN PRIOR TO INTERCOURSE 01/21/23   Hollice Espy, MD   Recent Labs    02/18/23 2214 02/19/23 0409  WBC 8.7 5.8  HGB 13.8 13.1  HCT 43.6 41.7  PLT 165 151  K 3.5 3.7  CL 104 108  CO2 23 20*  BUN 32* 30*  CREATININE 1.61* 1.48*  GLUCOSE 86 105*  CALCIUM 8.5* 7.9*   CT Hip Left Wo Contrast  Result Date: 02/18/2023 CLINICAL DATA:  Trauma EXAM: CT OF THE LEFT HIP WITHOUT CONTRAST TECHNIQUE: Multidetector CT imaging of the left hip was performed according to the standard protocol. Multiplanar CT image reconstructions were also generated. RADIATION DOSE REDUCTION: This exam was performed according to the departmental dose-optimization program which includes automated exposure control, adjustment of the mA and/or kV according to patient size and/or use of iterative reconstruction technique. COMPARISON:  X-ray same day FINDINGS: Bones/Joint/Cartilage The bones are osteopenic. There is subtle cortical step-off at the femoral head neck junction laterally and anteriorly suspicious for nondisplaced subcapital fracture. No dislocation. There are mild degenerative changes of  the left hip with joint space narrowing and osteophyte formation. Ligaments Suboptimally assessed by CT. Muscles and Tendons Negative. Soft tissues No focal hematoma.  There is sigmoid colon diverticulosis. IMPRESSION: 1. Subtle nondisplaced subcapital left femoral neck fracture. 2. Osteopenia. 3. Mild degenerative changes of the left hip. 4. Sigmoid colon diverticulosis. Electronically Signed   By: Ronney Asters M.D.   On: 02/18/2023 20:53   DG Chest 1 View  Result Date: 02/18/2023 CLINICAL DATA:  Fall, initial encounter. EXAM: CHEST  1 VIEW COMPARISON:  Radiograph 09/09/2021 FINDINGS: The cardiomediastinal contours are normal. The lungs are clear. Pulmonary vasculature is normal. No consolidation, pleural effusion, or pneumothorax. No acute osseous abnormalities are seen. IMPRESSION: No acute findings. Electronically Signed   By: Keith Rake M.D.   On: 02/18/2023 20:09   DG Hip Unilat W or Wo Pelvis 2-3 Views Left  Result Date: 02/18/2023 CLINICAL DATA:  Fall with left hip pain. EXAM: DG HIP (WITH OR WITHOUT PELVIS) 2-3V LEFT COMPARISON:  Abdominal radiograph from acute abdomen series 02/15/2019 FINDINGS: Cortical step-off of the femoral head neck junction suspicious for impacted femoral neck fracture. Femoral head is seated in the acetabulum. The pubic rami are intact. No symphyseal or sacroiliac joint diastasis. Chronic changes of both sacroiliac joints. There is bilateral hip joint space narrowing and acetabular spurring. Surgical hardware in the lower lumbar spine is partially included. IMPRESSION: Cortical step-off of the left femoral head neck junction suspicious for impacted femoral neck fracture. Electronically Signed   By: Keith Rake M.D.   On: 02/18/2023 20:08     Positive ROS: All other systems have been reviewed and were otherwise negative with the exception of those mentioned in the HPI and as above.  Physical Exam: BP 128/75   Pulse 79   Temp 97.9 F (36.6 C)   Resp 20    Ht '6\' 2"'$  (1.88 m)   Wt 108.9 kg   SpO2 97%   BMI 30.81 kg/m  General:  Alert, no acute distress Psychiatric:  Patient is competent for consent with normal mood and affect    Orthopedic Exam:  LLE: + DF/PF/EHL SILT grossly over foot Foot wwp +Log roll/axial load    X-rays:  As above: L valgus impacted femoral neck fracture  Assessment/Plan: Jonathon Snow  is a 62 y.o. male with a valgus impacted femoral neck fracture   1. I discussed the various treatment options including both surgical and non-surgical management of the fracture with the patient. We discussed the high risk of perioperative complications due to patient's age and other co-morbidities. After discussion of risks, benefits, and alternatives to surgery, the patient was in agreement to proceed with surgery. The goals of surgery would be to provide adequate pain relief and allow for mobilization. Plan for surgery is L hip percutaneous pinning later today 2. NPO until after OR 3. Hold anticoagulation in advance of OR    Leim Fabry   02/19/2023 11:47 AM

## 2023-02-20 DIAGNOSIS — S72002A Fracture of unspecified part of neck of left femur, initial encounter for closed fracture: Secondary | ICD-10-CM | POA: Diagnosis not present

## 2023-02-20 LAB — CBC
HCT: 39.5 % (ref 39.0–52.0)
Hemoglobin: 12.7 g/dL — ABNORMAL LOW (ref 13.0–17.0)
MCH: 29.2 pg (ref 26.0–34.0)
MCHC: 32.2 g/dL (ref 30.0–36.0)
MCV: 90.8 fL (ref 80.0–100.0)
Platelets: 113 10*3/uL — ABNORMAL LOW (ref 150–400)
RBC: 4.35 MIL/uL (ref 4.22–5.81)
RDW: 14.6 % (ref 11.5–15.5)
WBC: 5.1 10*3/uL (ref 4.0–10.5)
nRBC: 0 % (ref 0.0–0.2)

## 2023-02-20 LAB — BASIC METABOLIC PANEL
Anion gap: 7 (ref 5–15)
BUN: 34 mg/dL — ABNORMAL HIGH (ref 8–23)
CO2: 21 mmol/L — ABNORMAL LOW (ref 22–32)
Calcium: 8.2 mg/dL — ABNORMAL LOW (ref 8.9–10.3)
Chloride: 108 mmol/L (ref 98–111)
Creatinine, Ser: 1.62 mg/dL — ABNORMAL HIGH (ref 0.61–1.24)
GFR, Estimated: 48 mL/min — ABNORMAL LOW (ref >60)
Glucose, Bld: 147 mg/dL — ABNORMAL HIGH (ref 70–99)
Potassium: 4.8 mmol/L (ref 3.5–5.1)
Sodium: 136 mmol/L (ref 135–145)

## 2023-02-20 MED ORDER — TRAMADOL HCL 50 MG PO TABS: 50.0000 mg | ORAL_TABLET | Freq: Four times a day (QID) | ORAL | 0 refills | Status: DC | PRN

## 2023-02-20 MED ORDER — OXYCODONE HCL 5 MG PO TABS: 2.5000 mg | ORAL_TABLET | ORAL | 0 refills | Status: DC | PRN

## 2023-02-20 MED ORDER — METHOCARBAMOL 500 MG PO TABS: 500.0000 mg | ORAL_TABLET | Freq: Four times a day (QID) | ORAL | 0 refills | Status: DC | PRN

## 2023-02-20 MED ORDER — ONDANSETRON HCL 4 MG PO TABS: 4.0000 mg | ORAL_TABLET | Freq: Four times a day (QID) | ORAL | 0 refills | Status: DC | PRN

## 2023-02-20 MED ORDER — ACETAMINOPHEN 500 MG PO TABS: 1000.0000 mg | ORAL_TABLET | Freq: Three times a day (TID) | ORAL | 0 refills | Status: DC

## 2023-02-20 NOTE — Evaluation (Signed)
Occupational Therapy Evaluation Patient Details Name: Jonathon Snow MRN: [id number] DOB: 06-12-1961 Today's Date: 02/20/2023   History of Present Illness Pt is a 62 y.o. male presenting to hospital 02/18/23 s/p fall at work onto L side.  S/p percutaneous pinning of L valgus impacted femoral neck fx 02/19/23.  PMH includes h/o DVT, PE (on lifelong anti-coagulation), htn, HLD, lupus, lymphedema, R ankle surgery, back surgery.   Clinical Impression   Mr. Rozzi is able to perform bed mobility, transfers, ambulation with RW, toileting, grooming in standing, all with SUPV. Endorses 5/10 pain in sitting, climbing to 7/10 with movement. Slow, steady ambulation with RW, no LOB noted, good sitting and standing posture. Provided educ re: AE for LB, safe use of RW, w/ pt verbalizing understanding and able to provide teach-back. Pt lives alone in a single-story home, 1 STE, has been IND prior to this injury, working full time in a physically demanding job. Pt states his daughter lives close by and will be able to provide assistance as needed next week; however, she and her family are out of state, on vacation, this week. Recommend RW and BSC, no additional OT services required at this time.   Recommendations for follow up therapy are one component of a multi-disciplinary discharge planning process, led by the attending physician.  Recommendations may be updated based on patient status, additional functional criteria and insurance authorization.   Follow Up Recommendations  No OT follow up     Assistance Recommended at Discharge PRN  Patient can return home with the following A little help with bathing/dressing/bathroom;Assistance with cooking/housework;Assist for transportation    Functional Status Assessment  Patient has had a recent decline in their functional status and demonstrates the ability to make significant improvements in function in a reasonable and predictable amount of time.  Equipment  Recommendations  BSC/3in1;Other (comment) (RW)    Recommendations for Other Services       Precautions / Restrictions Precautions Precautions: Fall Restrictions Weight Bearing Restrictions: Yes LLE Weight Bearing: Weight bearing as tolerated      Mobility Bed Mobility Overal bed mobility: Needs Assistance Bed Mobility: Supine to Sit, Sit to Supine     Supine to sit: Supervision Sit to supine: Min guard   General bed mobility comments: increased time and effort. Had difficulty/increased pain with raising L LE to bed height, but ultimately able to do w/o physical assist    Transfers Overall transfer level: Needs assistance Equipment used: Rolling walker (2 wheels) Transfers: Sit to/from Stand, Bed to chair/wheelchair/BSC Sit to Stand: Min guard     Step pivot transfers: Min guard     General transfer comment: vc's for UE/LE placement, positioning, and overall technique; increased effort to stand and sit noted      Balance Overall balance assessment: Needs assistance Sitting-balance support: No upper extremity supported, Feet supported Sitting balance-Leahy Scale: Good     Standing balance support: Bilateral upper extremity supported, During functional activity, Reliant on assistive device for balance Standing balance-Leahy Scale: Good Standing balance comment: steady, very slow ambulation with RW                           ADL either performed or assessed with clinical judgement   ADL Overall ADL's : Needs assistance/impaired Eating/Feeding: Independent                   Lower Body Dressing: Maximal assistance Lower Body Dressing Details (indicate cue type and  reason): Max A for donning/doffing socks. Provided educ/demo sock aid, per pt request Toilet Transfer: Supervision/safety;Rolling walker (2 wheels);Comfort height toilet Toilet Transfer Details (indicate cue type and reason): regular toilet w/ 3in1 over it         Functional  mobility during ADLs: Rolling walker (2 wheels);Supervision/safety       Vision         Perception     Praxis      Pertinent Vitals/Pain Pain Assessment Pain Assessment: 0-10 Pain Score: 6  Pain Location: L hip/thigh Pain Descriptors / Indicators: Aching, Tender, Sore Pain Intervention(s): Repositioned, Limited activity within patient's tolerance     Hand Dominance Right   Extremity/Trunk Assessment Upper Extremity Assessment Upper Extremity Assessment: Overall WFL for tasks assessed   Lower Extremity Assessment Lower Extremity Assessment: LLE deficits/detail LLE Deficits / Details: at least 3-/5 hip flexion (limited d/t L hip/thigh pain); at least 3-/5 knee flexion/extension (limited d/t L hip/thigh pain); at least 3/5 AROM ankle DF/PF LLE: Unable to fully assess due to pain   Cervical / Trunk Assessment Cervical / Trunk Assessment: Normal   Communication Communication Communication: No difficulties   Cognition Arousal/Alertness: Awake/alert Behavior During Therapy: WFL for tasks assessed/performed Overall Cognitive Status: Within Functional Limits for tasks assessed                                       General Comments  mild drainage noted L hip dressing    Exercises Other Exercises Other Exercises: Educ re: AE for LB dressing/bathing, safe AD use, falls prevention, car transfer techniques, home modifications   Shoulder Instructions      Home Living Family/patient expects to be discharged to:: Private residence Living Arrangements: Alone Available Help at Discharge: Family;Friend(s);Available PRN/intermittently Type of Home: House Home Access: Stairs to enter [employer] of Steps: 1 Entrance Stairs-Rails: None Home Layout: One level     Bathroom Shower/Tub: Teacher, early years/pre: Standard     Home Equipment: None          Prior Functioning/Environment Prior Level of Function : Independent/Modified  Independent             Mobility Comments: No other recent falls.  (+) working. ADLs Comments: IND in B/IADL        OT Problem List: Decreased strength;Decreased range of motion;Decreased activity tolerance;Impaired balance (sitting and/or standing);Pain      OT Treatment/Interventions:      OT Goals(Current goals can be found in the care plan section) Acute Rehab OT Goals Patient Stated Goal: to go home today OT Goal Formulation: With patient Time For Goal Achievement: 03/06/23 Potential to Achieve Goals: Good  OT Frequency:      Co-evaluation              AM-PAC OT "6 Clicks" Daily Activity     Outcome Measure Help from another person eating meals?: None Help from another person taking care of personal grooming?: None Help from another person toileting, which includes using toliet, bedpan, or urinal?: A Little Help from another person bathing (including washing, rinsing, drying)?: A Little Help from another person to put on and taking off regular upper body clothing?: None Help from another person to put on and taking off regular lower body clothing?: A Lot 6 Click Score: 20   End of Session Equipment Utilized During Treatment: Rolling walker (2 wheels)  Activity Tolerance: Patient  tolerated treatment well Patient left: in chair;with call bell/phone within reach  OT Visit Diagnosis: Unsteadiness on feet (R26.81);Muscle weakness (generalized) (M62.81);Pain                Time: [id number] OT Time Calculation (min): 31 min Charges:  OT General Charges $OT Visit: 1 Visit OT Evaluation $OT Eval Low Complexity: 1 Low OT Treatments $Self Care/Home Management : 23-37 mins Josiah Lobo, PhD, MS, OTR/L 02/20/23, 12:43 PM

## 2023-02-20 NOTE — TOC Progression Note (Addendum)
Transition of Care [facility]) - Progression Note    Patient Details  Name: GENTRY GARCILAZO MRN: [id number] Date of Birth: 31-Jul-1961  Transition of Care [facility]) CM/SW [place], RN Phone Number: 02/20/2023, 10:44 AM  Clinical Narrative:    Met with the [facility] [place] [phone], I provided her with the [facility] orders to get [facility] set up, We requested that he get a rolling walker and a 3 in1, to be provided by [employer] I provided my information for them to let me know once [facility] is set up, they are expediting the request  Case ID number [id number] I sent the DME orders to [facility] thru secure email Expected Discharge Plan: OP Rehab Barriers to Discharge: No [place] will accept this patient  Expected Discharge Plan and Services       Living arrangements for the past 2 months: Single Family Home                                       Social Determinants of Health (SDOH) Interventions SDOH Screenings   Food Insecurity: No Food Insecurity (02/19/2023)  Housing: [place]  (02/19/2023)  Transportation Needs: No Transportation Needs (02/19/2023)  Utilities: Not At Risk (02/19/2023)  Alcohol Screen: Low Risk  (10/29/2022)  Depression (PHQ2-9): Low Risk  (02/03/2023)  Tobacco Use: Low Risk  (02/19/2023)    Readmission Risk Interventions     No data to display

## 2023-02-20 NOTE — Progress Notes (Signed)
Physical Therapy Treatment Patient Details Name: Jonathon Snow MRN: [id number] DOB: [birth year] Today's Date: 02/20/2023   History of Present Illness Pt is a 62 y.o. male presenting to hospital 02/18/23 s/p fall at work onto L side.  S/p percutaneous pinning of L valgus impacted femoral neck fx 02/19/23.  PMH includes h/o DVT, PE (on lifelong anti-coagulation), htn, HLD, lupus, lymphedema, R ankle surgery, back surgery.    PT Comments    Pt resting in recliner upon PT arrival; pt reporting 7/10 L hip pain but agreeable to therapy session (nursing brought pt pain meds beginning of session).  Performed sitting LE ex's (pt reporting no questions/concerns with LE ex's performed this morning or this afternoon; pt issued written HEP).  During session pt modified independent with bed mobility; modified independent with transfers using RW; SBA with ambulating 200 feet with RW use; and CGA to navigate platform step (x2 trials) with RW use.  Pt steady with mobility throughout session.  Reviewed home safety and fall prevention.  Pt reporting no questions/concerns for home discharge.  Pt's DME (3 in 1 BSC and RW) delivered during session; pt's RW set up/adjusted for home use.  7/10 L hip/thigh pain at rest end of session (pt resting in bed).  Will continue to focus on strengthening and progressive functional mobility during hospitalization.     Recommendations for follow up therapy are one component of a multi-disciplinary discharge planning process, led by the attending physician.  Recommendations may be updated based on patient status, additional functional criteria and insurance authorization.  Follow Up Recommendations  Home health PT     Assistance Recommended at Discharge PRN  Patient can return home with the following A little help with walking and/or transfers;A little help with bathing/dressing/bathroom;Assistance with cooking/housework;Assist for transportation;Help with stairs or ramp for entrance    Equipment Recommendations  Rolling walker (2 wheels);BSC/3in1    Recommendations for Other Services OT consult     Precautions / Restrictions Precautions Precautions: Fall Restrictions Weight Bearing Restrictions: Yes LLE Weight Bearing: Weight bearing as tolerated     Mobility  Bed Mobility Overal bed mobility: Modified Independent Bed Mobility: Supine to Sit, Sit to Supine     Supine to sit: Modified independent (Device/Increase time) Sit to supine: Modified independent (Device/Increase time)   General bed mobility comments: bed flat; increased effort/time to bring L LE in and then OOB    Transfers Overall transfer level: Modified independent Equipment used: Rolling walker (2 wheels) Transfers: Sit to/from Stand, Bed to chair/wheelchair/BSC Sit to Stand: Modified independent (Device/Increase time)   Step pivot transfers: Modified independent (Device/Increase time)       General transfer comment: steady safe transfers from recliner and from bed; mild increased effort to stand noted    Ambulation/Gait Ambulation/Gait assistance: Supervision Gait Distance (Feet): 200 Feet Assistive device: Rolling walker (2 wheels)   Gait velocity: decreased     General Gait Details: antalgic; mild decreased stance time L LE; step to progressing to partial step through gait pattern; steady with RW use   Stairs Stairs: Yes Stairs assistance: Min guard Stair Management: Step to pattern, Forwards, With walker Number of Stairs: 2 General stair comments: ascended/descended platform step (x2 trials) with RW; initial vc's for technique and then pt able to perform safely on own   Wheelchair Mobility    Modified Rankin (Stroke Patients Only)       Balance Overall balance assessment: Needs assistance Sitting-balance support: No upper extremity supported, Feet supported Sitting balance-Leahy Scale: Good  Sitting balance - Comments: steady sitting reaching within BOS    Standing balance support: Bilateral upper extremity supported, During functional activity, Reliant on assistive device for balance Standing balance-Leahy Scale: Good Standing balance comment: steady ambulation with RW                            Cognition Arousal/Alertness: Awake/alert Behavior During Therapy: WFL for tasks assessed/performed Overall Cognitive Status: Within Functional Limits for tasks assessed                                          Exercises Total Joint Exercises Long Arc Quad: AROM, Strengthening, Left, 10 reps, Seated General Exercises - Lower Extremity Hip Flexion/Marching: AAROM, AROM, Strengthening, Left, 10 reps, Seated    General Comments        Pertinent Vitals/Pain Pain Assessment Pain Assessment: 0-10 Pain Score: 7  Pain Location: L hip/thigh Pain Descriptors / Indicators: Aching, Tender, Sore Pain Intervention(s): Limited activity within patient's tolerance, Monitored during session, Repositioned, Patient requesting pain meds-RN notified, RN gave pain meds during session Vitals (HR and O2 on room air) stable and WFL throughout treatment session.    Home Living        Prior Function            PT Goals (current goals can now be found in the care plan section) Acute Rehab PT Goals Patient Stated Goal: to go home today PT Goal Formulation: With patient Time For Goal Achievement: 03/06/23 Potential to Achieve Goals: Good Progress towards PT goals: Progressing toward goals    Frequency    BID      PT Plan Current plan remains appropriate    Co-evaluation              AM-PAC PT "6 Clicks" Mobility   Outcome Measure  Help needed turning from your back to your side while in a flat bed without using bedrails?: None Help needed moving from lying on your back to sitting on the side of a flat bed without using bedrails?: None Help needed moving to and from a bed to a chair (including a wheelchair)?:  None Help needed standing up from a chair using your arms (e.g., wheelchair or bedside chair)?: None Help needed to walk in hospital room?: A Little Help needed climbing 3-5 steps with a railing? : A Little 6 Click Score: 22    End of Session Equipment Utilized During Treatment: Gait belt Activity Tolerance: Patient tolerated treatment well;No increased pain Patient left: in bed;with call bell/phone within reach;with bed alarm set;with SCD's reapplied Nurse Communication: Mobility status;Precautions;Weight bearing status;Patient requests pain meds PT Visit Diagnosis: Other abnormalities of gait and mobility (R26.89);Muscle weakness (generalized) (M62.81);History of falling (Z91.81);Pain Pain - Right/Left: Left Pain - part of body: Hip     Time: 1345-1420 PT Time Calculation (min) (ACUTE ONLY): 35 min  Charges:  $Gait Training: 8-22 mins $Therapeutic Activity: 8-22 mins                     Leitha Bleak, PT 02/20/23, 3:44 PM

## 2023-02-20 NOTE — Discharge Summary (Signed)
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 male with a PMH significant for COPD, HTN, DVT, CKDIII, AD, PE, right lug nodule, cystic liver disease, SLE, HLD, venous insufficiency, osteoporosis, MDD.   They presented from home to the ED on 02/18/2023 with fall at work. Slipped on slippery floor. Denies head injury.   In the ED, it was found that they had stable vital signs with mild hypertension.  Significant findings included normal CBC, unremarkable metabolic panel. Xray left hip: Cortical step-off of the left femoral head neck junction suspicious for impacted femoral neck fracture. CT left hip confirmed: Subtle nondisplaced subcapital left femoral neck fracture.   They were initially treated with analgesia and immobilization. Ortho surgery was consulted.      2/29- fixation today without complication.    02/20/23 -recovering well. Evaluated by PT who recommends HHPT.  He was ambulating slowly with a walker on morning rounds. Pain is well controlled. He was discharged in stable condition and will follow up  with ortho outpatient.  All other chronic conditions were treated with home medications.   Discharge Condition: Good, improved Recommended discharge diet: Regular healthy diet  Consultations: Ortho surgery   Procedures/Studies: Percutaneous pinning of left femoral neck fracture 2/29  Allergies as of 02/20/2023       Reactions   Enalapril Other (See Comments)   Unknown reaction   Vicodin [hydrocodone-acetaminophen] Hives, Rash   Severe headaches (also) NAME BRAND ONLY PER PT CAN TAKE GENERIC        Medication List     TAKE these medications    acetaminophen 500 MG tablet Commonly known as: TYLENOL Take 2 tablets (1,000 mg total) by mouth every 8 (eight) hours.   albuterol 108 (90 Base) MCG/ACT inhaler Commonly known as: VENTOLIN HFA Inhale 2 puffs into the lungs every 6 (six) hours as needed for wheezing or shortness of breath.   amLODipine 5 MG tablet Commonly known as: NORVASC Take 1 tablet (5 mg total) by mouth daily.   apixaban 5 MG Tabs tablet Commonly known as: Eliquis Take 1 tablet (5 mg total) by mouth 2 (two) times daily.   cholecalciferol 25 MCG (1000 UNIT) tablet Commonly known as: VITAMIN D3 Take 1,000 Units by mouth daily.   levocetirizine 5 MG tablet Commonly known as: XYZAL Take 1 tablet (5 mg total) by mouth every evening.   losartan 100 MG tablet Commonly known as: COZAAR Take 1 tablet (100 mg total) by mouth daily.   meclizine 25 MG tablet Commonly known as: ANTIVERT Take 0.5-1 tablets (12.5-25 mg total) by mouth 3 (three) times daily as needed for dizziness.   methocarbamol 500 MG tablet  Commonly known as: ROBAXIN Take 1 tablet (500 mg total) by mouth every 6 (six) hours as needed for muscle spasms.   montelukast 10 MG tablet Commonly known as: SINGULAIR Take 1 tablet (10 mg total) by mouth at bedtime.   multivitamin with minerals tablet Take 1 tablet by mouth daily.   mycophenolate 500 MG tablet Commonly known as:  CELLCEPT Take 1,000 mg by mouth 2 (two) times daily.   ondansetron 4 MG tablet Commonly known as: ZOFRAN Take 1 tablet (4 mg total) by mouth every 6 (six) hours as needed for nausea.   oxyCODONE 5 MG immediate release tablet Commonly known as: Oxy IR/ROXICODONE Take 0.5-1 tablets (2.5-5 mg total) by mouth every 4 (four) hours as needed for moderate pain (pain score 4-6).   predniSONE 10 MG tablet Commonly known as: DELTASONE Take 1 tablet by mouth daily.   promethazine-dextromethorphan 6.25-15 MG/5ML syrup Commonly known as: PROMETHAZINE-DM Take 5 mLs by mouth 4 (four) times daily as needed for cough.   rosuvastatin 10 MG tablet Commonly known as: Crestor Take 1 tablet (10 mg total) by mouth daily.   Ryaltris [id number] MCG/ACT Susp Generic drug: Olopatadine-Mometasone Place 2 sprays into the nose 2 (two) times daily as needed.   sertraline 25 MG tablet Commonly known as: ZOLOFT Take 1 tablet (25 mg total) by mouth daily.   sildenafil 20 MG tablet Commonly known as: REVATIO TAKE 3 TO 5 TABLETS BY MOUTH AS NEEDED 30 MIN PRIOR TO INTERCOURSE   traMADol 50 MG tablet Commonly known as: ULTRAM Take 1 tablet (50 mg total) by mouth every 6 (six) hours as needed for moderate pain.               Durable Medical Equipment  (From admission, onward)           Start     Ordered   02/20/23 1132  For home use only DME Bedside commode  Once       Question:  Patient needs a bedside commode to treat with the following condition  Answer:  Impaired mobility   02/20/23 1131   02/20/23 1045  For home use only DME Walker rolling  Once       Question Answer Comment  Walker: With [place]   Patient needs a walker to treat with the following condition Impaired mobility      02/20/23 1045            Follow-up Information     Reche Dixon, PA-C Follow up in 2 week(s).   Specialty: Orthopedic Surgery Why: For staple removal and x-rays of the left hip Contact  information: [address] [place] [address] [phone]                 Subjective   Pt reports feeling well. He states that he has little to no pain at rest in his hip. Tender to palpation. He has been able to urinate and have BM since surgery without difficulty. He's tolerating normal diet.   All questions and concerns were addressed at time of discharge.  Objective  Blood pressure 133/68, pulse 71, temperature 97.8 F (36.6 C), resp. rate 18, height '6\' 2"'$  (1.88 m), weight 108.9 kg, SpO2 97 %.   General: Pt is alert, awake, not in acute distress Cardiovascular: RRR, S1/S2 +, no rubs, no gallops Respiratory: CTA bilaterally, no wheezing, no rhonchi Abdominal: Soft, NT, ND, bowel sounds + Extremities: good distal strength, ROM, circulation bilaterally.  Left hip with surgical incision healing well. Honeycomb bandage needs replacing as it has some blood. Swelling and bruising minimal. Tender to palpation around surgical incision. Ambulating with walker.   The results of significant diagnostics from this hospitalization (including imaging, microbiology, ancillary and laboratory) are listed below for reference.   Imaging studies: DG HIP UNILAT WITH PELVIS 2-3 VIEWS LEFT  Result Date: 02/19/2023 CLINICAL DATA:  Fluoro guidance provided EXAM: DG HIP (WITH OR WITHOUT PELVIS) 2-3V LEFT; DG C-ARM 1-60 MIN-NO REPORT FINDINGS: Dose: 36.7 mGy Fluoro time 78s IMPRESSION: C-arm fluoro guidance provided. Electronically Signed   By: Sammie Bench M.D.   On: 02/19/2023 13:45   DG C-Arm 1-60 Min-No Report  Result Date: 02/19/2023 Fluoroscopy was utilized by the requesting physician.  No radiographic interpretation.   DG C-Arm 1-60 Min-No Report  Result Date: 02/19/2023 Fluoroscopy was utilized by the requesting physician.  No radiographic interpretation.   CT Hip Left Wo Contrast  Result Date: 02/18/2023 CLINICAL DATA:  Trauma EXAM: CT OF THE LEFT HIP  WITHOUT CONTRAST TECHNIQUE: Multidetector CT imaging of the left hip was performed according to the standard protocol. Multiplanar CT image reconstructions were also generated. RADIATION DOSE REDUCTION: This exam was performed according to the departmental dose-optimization program which includes automated exposure control, adjustment of the mA and/or kV according to patient size and/or use of iterative reconstruction technique. COMPARISON:  X-ray same day FINDINGS: Bones/Joint/Cartilage The bones are osteopenic. There is subtle cortical step-off at the femoral head neck junction laterally and anteriorly suspicious for nondisplaced subcapital fracture. No dislocation. There are mild degenerative changes of the left hip with joint space narrowing and osteophyte formation. Ligaments Suboptimally assessed by CT. Muscles and Tendons Negative. Soft tissues No focal hematoma.  There is sigmoid colon diverticulosis. IMPRESSION: 1. Subtle nondisplaced subcapital left femoral neck fracture. 2. Osteopenia. 3. Mild degenerative changes of the left hip. 4. Sigmoid colon diverticulosis. Electronically Signed   By: Ronney Asters M.D.   On: 02/18/2023 20:53   DG Chest 1 View  Result Date: 02/18/2023 CLINICAL DATA:  Fall, initial encounter. EXAM: CHEST  1 VIEW COMPARISON:  Radiograph 09/09/2021 FINDINGS: The cardiomediastinal contours are normal. The lungs are clear. Pulmonary vasculature is normal. No consolidation, pleural effusion, or pneumothorax. No acute osseous abnormalities are seen. IMPRESSION: No acute findings. Electronically Signed   By: Keith Rake M.D.   On: 02/18/2023 20:09   DG Hip Unilat W or Wo Pelvis 2-3 Views Left  Result Date: 02/18/2023 CLINICAL DATA:  Fall with left hip pain. EXAM: DG HIP (WITH OR WITHOUT PELVIS) 2-3V LEFT COMPARISON:  Abdominal radiograph from acute abdomen series 02/15/2019 FINDINGS: Cortical step-off of the femoral head neck junction suspicious for impacted femoral neck  fracture. Femoral head is seated in the acetabulum. The pubic rami are intact. No symphyseal or sacroiliac joint diastasis. Chronic changes of both sacroiliac joints. There is bilateral hip joint space narrowing and acetabular spurring. Surgical hardware in the lower lumbar spine is partially included. IMPRESSION: Cortical step-off of the left femoral head neck junction suspicious for impacted femoral neck fracture. Electronically Signed   By: Keith Rake M.D.   On: 02/18/2023 20:08   VAS [place] LOWER EXTREMITY VENOUS REFLUX  Result Date: 02/11/2023  Lower Venous Reflux Study Patient Name:  BASTION GIERE  Date of Exam:   02/06/2023 Medical Rec #: [id number]       Accession #:    [id number] Date of Birth: 15-Oct-1961      Patient Gender: Jerilynn Mages  Patient Age:   67 years Exam Location:  [facility] Procedure:      VAS [place] LOWER EXTREMITY VENOUS REFLUX Referring Phys: Corene Cornea DEW --------------------------------------------------------------------------------  Indications: Swelling.  Risk Factors: Left gsv ablation 2022. Performing Technologist: Concha Norway RVT  Examination Guidelines: A complete evaluation includes B-mode imaging, spectral Doppler, color Doppler, and power Doppler as needed of all accessible portions of each vessel. Bilateral testing is considered an integral part of a complete examination. Limited examinations for reoccurring indications may be performed as noted. The reflux portion of the exam is performed with the patient in reverse Trendelenburg. Significant venous reflux is defined as >500 ms in the superficial venous system, and >1 second in the deep venous system.  +----+---------+------+-----------+------------+--------+ LEFTReflux NoRefluxReflux TimeDiameter cmsComments               Yes                                  +----+---------+------+-----------+------------+--------+ CFV           yes   >1 second                       +----+---------+------+-----------+------------+--------+   Summary: Bilateral: - No evidence of deep vein thrombosis seen in the lower extremities, bilaterally, from the common femoral through the popliteal veins. - No evidence of superficial venous thrombosis in the lower extremities, bilaterally.  Right: - There is no evidence of venous reflux seen in the right lower extremity. - No evidence of superficial venous reflux seen in the right greater saphenous vein. - No evidence of superficial venous reflux seen in the right short saphenous vein.  Left: - No evidence of superficial venous reflux seen in the left short saphenous vein. - Venous reflux is noted in the left common femoral vein. - Left GSV closed by laser ablation  *See table(s) above for measurements and observations. Electronically signed by Leotis Pain MD on 02/11/2023 at 4:05:47 PM.    Final    US Venous Img Lower Bilateral  Result Date: 02/03/2023 CLINICAL DATA:  Acute right lower extremity swelling. EXAM: BILATERAL LOWER EXTREMITY VENOUS DOPPLER ULTRASOUND TECHNIQUE: Gray-scale sonography with graded compression, as well as color Doppler and duplex ultrasound were performed to evaluate the lower extremity deep venous systems from the level of the common femoral vein and including the common femoral, femoral, profunda femoral, popliteal and calf veins including the posterior tibial, peroneal and gastrocnemius veins when visible. The superficial great saphenous vein was also interrogated. Spectral Doppler was utilized to evaluate flow at rest and with distal augmentation maneuvers in the common femoral, femoral and popliteal veins. COMPARISON:  May 15, 2022. FINDINGS: RIGHT LOWER EXTREMITY Common Femoral Vein: No evidence of thrombus. Normal compressibility, respiratory phasicity and response to augmentation. Saphenofemoral Junction: No evidence of thrombus. Normal compressibility and flow on color Doppler imaging. Profunda Femoral Vein: No evidence  of thrombus. Normal compressibility and flow on color Doppler imaging. Femoral Vein: No evidence of thrombus. Normal compressibility, respiratory phasicity and response to augmentation. Popliteal Vein: No evidence of thrombus. Normal compressibility, respiratory phasicity and response to augmentation. Calf Veins: No evidence of thrombus. Normal compressibility and flow on color Doppler imaging. Superficial Great Saphenous Vein: No evidence of thrombus. Normal compressibility. Venous Reflux:  None. Other Findings:  None. LEFT LOWER EXTREMITY Common Femoral Vein: No evidence of thrombus. Normal compressibility, respiratory phasicity and response to  augmentation. Saphenofemoral Junction: No evidence of thrombus. Normal compressibility and flow on color Doppler imaging. Profunda Femoral Vein: No evidence of thrombus. Normal compressibility and flow on color Doppler imaging. Femoral Vein: No evidence of thrombus. Normal compressibility, respiratory phasicity and response to augmentation. Popliteal Vein: Partial compressibility is noted with some flow present consistent with nonocclusive thrombus. Calf Veins: No evidence of thrombus. Normal compressibility and flow on color Doppler imaging. Superficial Great Saphenous Vein: No evidence of thrombus. Normal compressibility. Venous Reflux:  None. Other Findings:  None. IMPRESSION: Nonocclusive deep venous thrombosis is noted in the left popliteal vein. No deep venous thrombosis is noted in right lower extremity. These results will be called to the ordering clinician or representative by the Radiologist Assistant, and communication documented in the PACS or zVision Dashboard. Electronically Signed   By: Marijo Conception M.D.   On: 02/03/2023 14:32    Labs: Basic Metabolic Panel: Recent Labs  Lab 02/18/23 2214 02/19/23 0409 02/20/23 0259  NA 139 138 136  K 3.5 3.7 4.8  CL 104 108 108  CO2 23 20* 21*  GLUCOSE 86 105* 147*  BUN 32* 30* 34*  CREATININE 1.61* 1.48*  1.62*  CALCIUM 8.5* 7.9* 8.2*   CBC: Recent Labs  Lab 02/18/23 2214 02/19/23 0409 02/20/23 0259  WBC 8.7 5.8 5.1  HGB 13.8 13.1 12.7*  HCT 43.6 41.7 39.5  MCV 92.8 92.9 90.8  PLT 165 151 113*   Microbiology: Results for orders placed or performed during the hospital encounter of 02/18/23  Resp panel by RT-PCR (RSV, Flu A&B, Covid) Anterior Nasal Swab     Status: None   Collection Time: 02/18/23 10:14 PM   Specimen: Anterior Nasal Swab  Result Value Ref Range Status   SARS Coronavirus 2 by RT PCR NEGATIVE NEGATIVE Final    Comment: (NOTE) SARS-CoV-2 target nucleic acids are NOT DETECTED.  The SARS-CoV-2 RNA is generally detectable in upper respiratory specimens during the acute phase of infection. The lowest concentration of SARS-CoV-2 viral copies this assay can detect is 138 copies/mL. A negative result does not preclude SARS-Cov-2 infection and should not be used as the sole basis for treatment or other patient management decisions. A negative result may occur with  improper specimen collection/handling, submission of specimen other than nasopharyngeal swab, presence of viral mutation(s) within the areas targeted by this assay, and inadequate number of viral copies(<138 copies/mL). A negative result must be combined with clinical observations, patient history, and epidemiological information. The expected result is Negative.  Fact Sheet for Patients:  EntrepreneurPulse.com.au  Fact Sheet for Healthcare Providers:  IncredibleEmployment.be  This test is no t yet approved or cleared by the [place] FDA and  has been authorized for detection and/or diagnosis of SARS-CoV-2 by FDA under an Emergency Use Authorization (EUA). This EUA will remain  in effect (meaning this test can be used) for the duration of the COVID-19 declaration under Section 564(b)(1) of the Act, 21 U.S.C.section 360bbb-3(b)(1), unless the authorization is  terminated  or revoked sooner.       Influenza A by PCR NEGATIVE NEGATIVE Final   Influenza B by PCR NEGATIVE NEGATIVE Final    Comment: (NOTE) The Xpert Xpress SARS-CoV-2/FLU/RSV plus assay is intended as an aid in the diagnosis of influenza from Nasopharyngeal swab specimens and should not be used as a sole basis for treatment. Nasal washings and aspirates are unacceptable for Xpert Xpress SARS-CoV-2/FLU/RSV testing.  Fact Sheet for Patients: EntrepreneurPulse.com.au  Fact Sheet for Healthcare Providers:  IncredibleEmployment.be  This test is not yet approved or cleared by the [place] and has been authorized for detection and/or diagnosis of SARS-CoV-2 by FDA under an Emergency Use Authorization (EUA). This EUA will remain in effect (meaning this test can be used) for the duration of the COVID-19 declaration under Section 564(b)(1) of the Act, 21 U.S.C. section 360bbb-3(b)(1), unless the authorization is terminated or revoked.     Resp Syncytial Virus by PCR NEGATIVE NEGATIVE Final    Comment: (NOTE) Fact Sheet for Patients: EntrepreneurPulse.com.au  Fact Sheet for Healthcare Providers: IncredibleEmployment.be  This test is not yet approved or cleared by the [place] FDA and has been authorized for detection and/or diagnosis of SARS-CoV-2 by FDA under an Emergency Use Authorization (EUA). This EUA will remain in effect (meaning this test can be used) for the duration of the COVID-19 declaration under Section 564(b)(1) of the Act, 21 U.S.C. section 360bbb-3(b)(1), unless the authorization is terminated or revoked.  Performed at [facility], [address] [place], [place] [address]    Time coordinating discharge: Over 30 minutes  Richarda Osmond, MD  [facility] 02/20/2023, 2:27 PM

## 2023-02-20 NOTE — Progress Notes (Signed)
Subjective: 1 Day Post-Op Procedure(s) (LRB): PERCUTANEOUS FIXATION OF FEMORAL NECK (Left) Patient reports pain as mild.   Patient is well, and has had no acute complaints or problems Plan is to go Home after hospital stay. Negative for chest pain and shortness of breath Fever: no Gastrointestinal: Negative for nausea and vomiting  Objective: Vital signs in last 24 hours: Temp:  [97.1 F (36.2 C)-98.9 F (37.2 C)] 98.3 F (36.8 C) (03/01 0403) Pulse Rate:  [58-81] 63 (03/01 0403) Resp:  [11-22] 18 (03/01 0403) BP: (124-140)/(74-87) 137/83 (03/01 0403) SpO2:  [89 %-100 %] 97 % (03/01 0403)  Intake/Output from previous day:  Intake/Output Summary (Last 24 hours) at 02/20/2023 0643 Last data filed at 02/20/2023 0500 Gross per 24 hour  Intake 1436.33 ml  Output 1175 ml  Net 261.33 ml    Intake/Output this shift: Total I/O In: 429.8 [I.V.:252.4; IV Piggyback:177.4] Out: 500 [Urine:500]  Labs: Recent Labs    02/18/23 2214 02/19/23 0409 02/20/23 0259  HGB 13.8 13.1 12.7*   Recent Labs    02/19/23 0409 02/20/23 0259  WBC 5.8 5.1  RBC 4.49 4.35  HCT 41.7 39.5  PLT 151 113*   Recent Labs    02/19/23 0409 02/20/23 0259  NA 138 136  K 3.7 4.8  CL 108 108  CO2 20* 21*  BUN 30* 34*  CREATININE 1.48* 1.62*  GLUCOSE 105* 147*  CALCIUM 7.9* 8.2*   No results for input(s): "LABPT", "INR" in the last 72 hours.   EXAM General - Patient is Alert and Oriented Extremity - Neurovascular intact Sensation intact distally Dorsiflexion/Plantar flexion intact Compartment soft Dressing/Incision - clean, dry, blood tinged drainage Motor Function - intact, moving foot and toes well on exam.   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   Overview:  superior sagittal sinus, left transverse sinus and cortical veins    COVID-19 virus infection 12/2020   Depression    DVT (deep venous thrombosis)  (HCC)    Dyspnea    GERD (gastroesophageal reflux disease)    Heel spur, left 02/16/2019   Heel spur, right 02/16/2019   Herpes zoster infection of lumbar region 02/20/2020   Hyperlipidemia    Hypertension    Lupus ([place])    Lymphedema 10/07/2018   Morbid obesity ([place])    Opiate abuse, episodic ([place]) 02/26/2018   Osteoporosis    Pneumonia    PONV (postoperative nausea and vomiting)    Postphlebitic syndrome with ulcer, left ([place]) 11/18/2016   Presence of IVC filter 03/22/2020   Removed   Pulmonary embolism (HCC)    Renal disorder    Stage III    Assessment/Plan: 1 Day Post-Op Procedure(s) (LRB): PERCUTANEOUS FIXATION OF FEMORAL NECK (Left) Principal Problem:   Closed left hip fracture, initial encounter (HCC) Active Problems:   COPD, moderate (HCC)   Primary hypertension   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   Weight as of this encounter: 108.9 kg. Advance diet Up with therapy  Discharge planning: When cleared by physical therapy and medicine, the patient can  discharge home with home health physical therapy.  Follow-up at [facility] orthopedics in 2 weeks for staple removal and x-ray of the left hip.  DVT Prophylaxis - Foot Pumps, TED hose, and Eliquis Weight-Bearing as tolerated to left leg  Jonathon Dixon, PA-C Orthopaedic Surgery 02/20/2023, 6:43 AM

## 2023-02-20 NOTE — Progress Notes (Signed)
PROGRESS NOTE  Jonathon Snow    DOB: 02-Apr-1961, 62 y.o.  [id number]    Code Status: Full Code   DOA: 02/18/2023   LOS: 2   Brief hospital course  Jonathon Snow is a 62 y.o. male with a PMH significant for COPD, HTN, DVT, CKDIII, AD, PE, right lug nodule, cystic liver disease, SLE, HLD, venous insufficiency, osteoporosis, MDD.  They presented from home to the ED on 02/18/2023 with fall. Slipped on slipper floor. Denies head injury.  In the ED, it was found that they had stable vital signs with mild hypertension.  Significant findings included normal CBC, unremarkable metabolic panel. Xray left hip: Cortical step-off of the left femoral head neck junction suspicious for impacted femoral neck fracture. CT left hip confirmed: Subtle nondisplaced subcapital left femoral neck fracture.  They were initially treated with analgesia and immobilization. Ortho surgery was consulted.    Patient was admitted to medicine service for further workup and management of hip fracture as outlined in detail below.  2/29- fixation today without complication.   02/20/23 -recovering well. Evaluated by PT who recommends HHPT.   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 Pain well controlled immediately post-op. Surgical incision clean, dry, well-approximated  - ortho following, appreciate care - analgesia PRN - PT/OT- HHPT recommended. Workers comp involved. Patient does not have home support at the moment as his family is out of town. He would be alone if  discharged and needs help with ADLs  CAD- neg acute symptoms - continue home medications, atorvastatin   CKDIII 2/2 SLE- normal Cr at presentation - avoid nephrotoxic agents.  - continue home med, cellcept - continue chronic steroids- '10mg'$  prednisone  HTN- well controlled - continue home meds, amlodipine, losartan  COPD- asymptomatic - continue home meds, albuterol, montelukast  H/o DVT- on chronic eliquis. Held for surgery - restart when able  MDD- not in acute exacerbation - continue home meds, sertraline  Body mass index is 30.81 kg/m.  VTE ppx: SCDs Start: 02/19/23 1501 apixaban (ELIQUIS) tablet 5 mg  Diet:     Diet   Diet regular Room service appropriate? Yes; Fluid consistency: Thin   Consultants: Ortho surgery  Subjective 02/20/23    Pt reports doing well. Pain to touch of him and with certain movements but overall well controlled. Working with PT during encounter and is able to ambulate with walker and sit in chair independently. His family is away on a trip so he has no support at home currently.    Objective   Vitals:   02/19/23 1529 02/19/23 2103 02/20/23 0023 02/20/23 0403  BP: 137/78 127/74 130/79 137/83  Pulse: 77 62 (!) 58 63  Resp: '17 18 18 18  '$ Temp: 97.7 F (36.5 C) 97.9 F (36.6 C) (!) 97.3 F (36.3 C) 98.3 F (36.8 C)  TempSrc:      SpO2: 95% 95% 97% 97%  Weight:      Height:        Intake/Output Summary (Last 24 hours) at 02/20/2023 0741 Last data filed at 02/20/2023  0500 Gross per 24 hour  Intake 1436.33 ml  Output 1175 ml  Net 261.33 ml    Filed Weights   02/18/23 1917  Weight: 108.9 kg    Physical Exam:  General: awake, alert, NAD HEENT: atraumatic, clear conjunctiva, anicteric sclera, MMM, hearing grossly normal Respiratory: normal respiratory effort. Cardiovascular: quick capillary refill, normal S1/S2, RRR, no JVD, murmurs Nervous: A&O x3. no gross focal neurologic deficits, normal speech Extremities: honey comb dressing  mildly saturated over incision on left thigh. Mild bruising/swelling. Tender to palpation. Skin: dry, intact, normal temperature, normal color. No rashes, lesions or ulcers on exposed skin Psychiatry: normal mood, congruent affect  Labs   I have personally reviewed the following labs and imaging studies CBC    Component Value Date/Time   WBC 5.1 02/20/2023 0259   RBC 4.35 02/20/2023 0259   HGB 12.7 (L) 02/20/2023 0259   HGB 10.4 (L) 04/10/2015 1036   HCT 39.5 02/20/2023 0259   HCT 31.4 (L) 04/10/2015 1036   PLT 113 (L) 02/20/2023 0259   PLT 179 04/10/2015 1036   MCV 90.8 02/20/2023 0259   MCV 92 04/10/2015 1036   MCH 29.2 02/20/2023 0259   MCHC 32.2 02/20/2023 0259   RDW 14.6 02/20/2023 0259   RDW 17.5 (H) 04/10/2015 1036   LYMPHSABS 0.4 (L) 10/01/2022 1031   LYMPHSABS 0.5 (L) 03/28/2015 1041   MONOABS 0.4 10/01/2022 1031   MONOABS 0.5 03/28/2015 1041   EOSABS 0.0 10/01/2022 1031   EOSABS 0.1 03/28/2015 1041   BASOSABS 0.0 10/01/2022 1031   BASOSABS 0.0 03/28/2015 1041      Latest Ref Rng & Units 02/20/2023    2:59 AM 02/19/2023    4:09 AM 02/18/2023   10:14 PM  BMP  Glucose 70 - 99 mg/dL 147  105  86   BUN 8 - 23 mg/dL 34  30  32   Creatinine 0.61 - 1.24 mg/dL 1.62  1.48  1.61   Sodium 135 - 145 mmol/L 136  138  139   Potassium 3.5 - 5.1 mmol/L 4.8  3.7  3.5   Chloride 98 - 111 mmol/L 108  108  104   CO2 22 - 32 mmol/L '21  20  23   '$ Calcium 8.9 - 10.3 mg/dL 8.2  7.9  8.5     DG HIP UNILAT WITH PELVIS 2-3 VIEWS LEFT  Result Date: 02/19/2023 CLINICAL DATA:  Fluoro guidance provided EXAM: DG HIP (WITH OR WITHOUT PELVIS) 2-3V LEFT; DG C-ARM 1-60 MIN-NO REPORT FINDINGS: Dose: 36.7 mGy Fluoro time 78s IMPRESSION: C-arm fluoro guidance provided. Electronically Signed   By: Sammie Bench M.D.   On: 02/19/2023 13:45   DG C-Arm 1-60 Min-No Report  Result Date: 02/19/2023 Fluoroscopy was utilized by the requesting physician.  No radiographic interpretation.   DG C-Arm 1-60  Min-No Report  Result Date: 02/19/2023 Fluoroscopy was utilized by the requesting physician.  No radiographic interpretation.   CT Hip Left Wo Contrast  Result Date: 02/18/2023 CLINICAL DATA:  Trauma EXAM: CT OF THE LEFT HIP WITHOUT CONTRAST TECHNIQUE: Multidetector CT imaging of the left hip was performed according to the standard protocol. Multiplanar CT image reconstructions were also generated. RADIATION DOSE REDUCTION: This exam was performed according to the departmental dose-optimization program which includes automated exposure control, adjustment of the mA and/or kV according to patient size and/or use of iterative reconstruction technique. COMPARISON:  X-ray same day FINDINGS: Bones/Joint/Cartilage The bones are osteopenic. There is subtle cortical step-off at the femoral  head neck junction laterally and anteriorly suspicious for nondisplaced subcapital fracture. No dislocation. There are mild degenerative changes of the left hip with joint space narrowing and osteophyte formation. Ligaments Suboptimally assessed by CT. Muscles and Tendons Negative. Soft tissues No focal hematoma.  There is sigmoid colon diverticulosis. IMPRESSION: 1. Subtle nondisplaced subcapital left femoral neck fracture. 2. Osteopenia. 3. Mild degenerative changes of the left hip. 4. Sigmoid colon diverticulosis. Electronically Signed   By: Ronney Asters M.D.   On: 02/18/2023 20:53   DG Chest 1 View  Result Date: 02/18/2023 CLINICAL DATA:  Fall, initial encounter. EXAM: CHEST  1 VIEW COMPARISON:  Radiograph 09/09/2021 FINDINGS: The cardiomediastinal contours are normal. The lungs are clear. Pulmonary vasculature is normal. No consolidation, pleural effusion, or pneumothorax. No acute osseous abnormalities are seen. IMPRESSION: No acute findings. Electronically Signed   By: Keith Rake M.D.   On: 02/18/2023 20:09   DG Hip Unilat W or Wo Pelvis 2-3 Views Left  Result Date: 02/18/2023 CLINICAL DATA:  Fall with left hip  pain. EXAM: DG HIP (WITH OR WITHOUT PELVIS) 2-3V LEFT COMPARISON:  Abdominal radiograph from acute abdomen series 02/15/2019 FINDINGS: Cortical step-off of the femoral head neck junction suspicious for impacted femoral neck fracture. Femoral head is seated in the acetabulum. The pubic rami are intact. No symphyseal or sacroiliac joint diastasis. Chronic changes of both sacroiliac joints. There is bilateral hip joint space narrowing and acetabular spurring. Surgical hardware in the lower lumbar spine is partially included. IMPRESSION: Cortical step-off of the left femoral head neck junction suspicious for impacted femoral neck fracture. Electronically Signed   By: Keith Rake M.D.   On: 02/18/2023 20:08    Disposition Plan & Communication  Patient status: Inpatient  Admitted From: Home Planned disposition location: Home Anticipated discharge date: 3/2 pending ortho clearance   Family Communication: none     Author: Richarda Osmond, DO [facility] 02/20/2023, 7:41 AM   Available by Epic secure chat 7AM-7PM. If 7PM-7AM, please contact night-coverage.  [facility] contact information found on [email or web address].

## 2023-02-20 NOTE — Progress Notes (Signed)
Patient is not able to walk the distance required to go the bathroom, or he/she is unable to safely negotiate stairs required to access the bathroom.  A 3in1 BSC will alleviate this problem  

## 2023-02-20 NOTE — Discharge Instructions (Addendum)
Please follow up with ortho surgery in 2 weeks.  Pain medication was sent to your pharmacy Home health physical therapy will work with you at home All other chronic home medications were unchanged  INSTRUCTIONS AFTER Surgery  Remove items at home which could result in a fall. This includes throw rugs or furniture in walking pathways ICE to the affected joint every three hours while awake for 30 minutes at a time, for at least the first 3-5 days, and then as needed for pain and swelling.  Continue to use ice for pain and swelling. You may notice swelling that will progress down to the foot and ankle.  This is normal after surgery.  Elevate your leg when you are not up walking on it.   Continue to use the breathing machine you got in the hospital (incentive spirometer) which will help keep your temperature down.  It is common for your temperature to cycle up and down following surgery, especially at night when you are not up moving around and exerting yourself.  The breathing machine keeps your lungs expanded and your temperature down.   DIET:  As you were doing prior to hospitalization, we recommend a well-balanced diet.  DRESSING / WOUND CARE / SHOWERING  Skin changes needed.  No showering.  Staples will be removed in 2 weeks at [facility] clinic orthopedics.  ACTIVITY  Increase activity slowly as tolerated, but follow the weight bearing instructions below.   No driving for 6 weeks or until further direction given by your physician.  You cannot drive while taking narcotics.  No lifting or carrying greater than 10 lbs. until further directed by your surgeon. Avoid periods of inactivity such as sitting longer than an hour when not asleep. This helps prevent blood clots.  You may return to work once you are authorized by your doctor.     WEIGHT BEARING  Weightbearing as tolerated on the left.   EXERCISES Gait training and ambulation training as well as strengthening of his left lower  extremity.  CONSTIPATION  Constipation is defined medically as fewer than three stools per week and severe constipation as less than one stool per week.  Even if you have a regular bowel pattern at home, your normal regimen is likely to be disrupted due to multiple reasons following surgery.  Combination of anesthesia, postoperative narcotics, change in appetite and fluid intake all can affect your bowels.   YOU MUST use at least one of the following options; they are listed in order of increasing strength to get the job done.  They are all available over the counter, and you may need to use some, POSSIBLY even all of these options:    Drink plenty of fluids (prune juice may be helpful) and high fiber foods Colace 100 mg by mouth twice a day  Senokot for constipation as directed and as needed Dulcolax (bisacodyl), take with full glass of water  Miralax (polyethylene glycol) once or twice a day as needed.  If you have tried all these things and are unable to have a bowel movement in the first 3-4 days after surgery call either your surgeon or your primary doctor.    If you experience loose stools or diarrhea, hold the medications until you stool forms back up.  If your symptoms do not get better within 1 week or if they get worse, check with your doctor.  If you experience "the worst abdominal pain ever" or develop nausea or vomiting, please contact the office immediately  for further recommendations for treatment.   ITCHING:  If you experience itching with your medications, try taking only a single pain pill, or even half a pain pill at a time.  You can also use Benadryl over the counter for itching or also to help with sleep.   TED HOSE STOCKINGS:  Use stockings on both legs until for at least 2 weeks or as directed by physician office. They may be removed at night for sleeping.  MEDICATIONS:  See your medication summary on the "After Visit Summary" that nursing will review with you.  You may  have some home medications which will be placed on hold until you complete the course of blood thinner medication.  It is important for you to complete the blood thinner medication as prescribed.  PRECAUTIONS:  If you experience chest pain or shortness of breath - call 911 immediately for transfer to the hospital emergency department.   If you develop a fever greater that 101 F, purulent drainage from wound, increased redness or drainage from wound, foul odor from the wound/dressing, or calf pain - CONTACT YOUR SURGEON.                                                   FOLLOW-UP APPOINTMENTS:  If you do not already have a post-op appointment, please call the office for an appointment to be seen by your surgeon.  Guidelines for how soon to be seen are listed in your "After Visit Summary", but are typically between 1-4 weeks after surgery.  OTHER INSTRUCTIONS:     MAKE SURE YOU:  Understand these instructions.  Get help right away if you are not doing well or get worse.    Thank you for letting us be a part of your medical care team.  It is a privilege we respect greatly.  We hope these instructions will help you stay on track for a fast and full recovery!

## 2023-02-20 NOTE — Evaluation (Signed)
Physical Therapy Evaluation Patient Details Name: Jonathon Snow MRN: [id number] DOB: [birth year] Today's Date: 02/20/2023  History of Present Illness  Pt is a 62 y.o. male presenting to hospital 02/18/23 s/p fall at work onto L side.  S/p percutaneous pinning of L valgus impacted femoral neck fx 02/19/23.  PMH includes h/o DVT, PE (on lifelong anti-coagulation), htn, HLD, lupus, lymphedema, R ankle surgery, back surgery.  Clinical Impression  Prior to injury, pt was independent with functional mobility; lives alone in 1 level home with 1 step to enter.  L hip/thigh pain 5/10 at rest beginning of session and 6/10 end of session at rest (nurse notified and brought pt pain meds).  Currently pt is CGA with transfers and ambulation 100 feet with RW use (limited mobility d/t L hip/thigh pain).  Pt would currently benefit from skilled PT to address noted impairments and functional limitations (see below for any additional details).  Upon hospital discharge, pt would benefit from ongoing therapy.    Recommendations for follow up therapy are one component of a multi-disciplinary discharge planning process, led by the attending physician.  Recommendations may be updated based on patient status, additional functional criteria and insurance authorization.  Follow Up Recommendations Home health PT      Assistance Recommended at Discharge PRN  Patient can return home with the following  A little help with walking and/or transfers;A little help with bathing/dressing/bathroom;Assistance with cooking/housework;Assist for transportation;Help with stairs or ramp for entrance    Equipment Recommendations Rolling walker (2 wheels)  Recommendations for Other Services  OT consult    Functional Status Assessment Patient has had a recent decline in their functional status and demonstrates the ability to make significant improvements in function in a reasonable and predictable amount of time.     Precautions /  Restrictions Precautions Precautions: Fall Restrictions Weight Bearing Restrictions: Yes LLE Weight Bearing: Weight bearing as tolerated      Mobility  Bed Mobility               General bed mobility comments: Deferred (pt in recliner beginning/end of session)    Transfers Overall transfer level: Needs assistance Equipment used: Rolling walker (2 wheels) Transfers: Sit to/from Stand Sit to Stand: Min guard           General transfer comment: vc's for UE/LE placement, positioning, and overall technique; increased effort to stand noted    Ambulation/Gait Ambulation/Gait assistance: Min guard Gait Distance (Feet): 100 Feet Assistive device: Rolling walker (2 wheels)   Gait velocity: decreased     General Gait Details: antalgic; decreased stance time L LE; step to progressing to partial step through gait pattern; steady with RW use  Stairs            Wheelchair Mobility    Modified Rankin (Stroke Patients Only)       Balance Overall balance assessment: Needs assistance Sitting-balance support: No upper extremity supported, Feet supported Sitting balance-Leahy Scale: Good Sitting balance - Comments: steady sitting reaching within BOS   Standing balance support: Bilateral upper extremity supported, During functional activity, Reliant on assistive device for balance Standing balance-Leahy Scale: Good Standing balance comment: steady ambulating with RW use                             Pertinent Vitals/Pain Pain Assessment Pain Assessment: 0-10 Pain Score: 6  Pain Location: L hip/thigh Pain Descriptors / Indicators: Aching, Tender, Sore Pain Intervention(s): Limited activity  within patient's tolerance, Monitored during session, Premedicated before session, Repositioned, Patient requesting pain meds-RN notified Vitals (HR and O2 on room air) stable and WFL throughout treatment session.    Home Living Family/patient expects to be  discharged to:: Private residence Living Arrangements: Alone Available Help at Discharge: Family;Friend(s);Available PRN/intermittently Type of Home: House Home Access: Stairs to enter Entrance Stairs-Rails: None Entrance Stairs-Number of Steps: 1   Home Layout: One level Home Equipment: Toilet riser      Prior Function Prior Level of Function : Independent/Modified Independent             Mobility Comments: No other recent falls.  (+) working.       Hand Dominance        Extremity/Trunk Assessment   Upper Extremity Assessment Upper Extremity Assessment: Overall WFL for tasks assessed    Lower Extremity Assessment Lower Extremity Assessment: LLE deficits/detail (R LE WFL) LLE Deficits / Details: at least 3-/5 hip flexion (limited d/t L hip/thigh pain); at least 3-/5 knee flexion/extension (limited d/t L hip/thigh pain); at least 3/5 AROM ankle DF/PF LLE: Unable to fully assess due to pain    Cervical / Trunk Assessment Cervical / Trunk Assessment: Normal  Communication   Communication: No difficulties  Cognition Arousal/Alertness: Awake/alert Behavior During Therapy: WFL for tasks assessed/performed Overall Cognitive Status: Within Functional Limits for tasks assessed                                          General Comments General comments (skin integrity, edema, etc.): mild drainage noted L hip dressing.  Nursing cleared pt for participation in physical therapy.  Pt agreeable to PT session.    Exercises Total Joint Exercises Ankle Circles/Pumps: AROM, Strengthening, Both, 10 reps, Supine Quad Sets: AROM, Strengthening, Both, 10 reps, Supine Gluteal Sets: AROM, Strengthening, Both, 10 reps, Supine Towel Squeeze: AROM, Strengthening, Both, 10 reps, Supine Short Arc Quad: AROM, Strengthening, Left, 10 reps, Supine Heel Slides: AAROM, Strengthening, Left, 10 reps, Supine Hip ABduction/ADduction: AAROM, Strengthening, Left, 10 reps,  Supine Straight Leg Raises: AAROM, Strengthening, Left, 10 reps, Supine   Assessment/Plan    PT Assessment Patient needs continued PT services  PT Problem List Decreased strength;Decreased activity tolerance;Decreased balance;Decreased mobility;Decreased knowledge of use of DME;Decreased knowledge of precautions;Decreased skin integrity;Pain       PT Treatment Interventions DME instruction;Gait training;Stair training;Functional mobility training;Therapeutic activities;Therapeutic exercise;Balance training;Patient/family education    PT Goals (Current goals can be found in the Care Plan section)  Acute Rehab PT Goals Patient Stated Goal: to go home today PT Goal Formulation: With patient Time For Goal Achievement: 03/06/23 Potential to Achieve Goals: Good    Frequency BID     Co-evaluation               AM-PAC PT "6 Clicks" Mobility  Outcome Measure Help needed turning from your back to your side while in a flat bed without using bedrails?: A Little Help needed moving from lying on your back to sitting on the side of a flat bed without using bedrails?: A Little Help needed moving to and from a bed to a chair (including a wheelchair)?: A Little Help needed standing up from a chair using your arms (e.g., wheelchair or bedside chair)?: A Little Help needed to walk in hospital room?: A Little Help needed climbing 3-5 steps with a railing? : A Little 6 Click  Score: 18    End of Session Equipment Utilized During Treatment: Gait belt Activity Tolerance: Patient tolerated treatment well Patient left: in chair;with call bell/phone within reach;with nursing/sitter in room;with SCD's reapplied;Other (comment) (L LE elevated on pillow with heel floating) Nurse Communication: Mobility status;Precautions;Weight bearing status;Patient requests pain meds;Other (comment) (nurse cleared pt to not have chair alarm on (pt verbalizing need/understanding to call for nursing assist and wait for  help prior to getting up to mobilize)) PT Visit Diagnosis: Other abnormalities of gait and mobility (R26.89);Muscle weakness (generalized) (M62.81);History of falling (Z91.81);Pain Pain - Right/Left: Left Pain - part of body: Hip    Time: [id number] PT Time Calculation (min) (ACUTE ONLY): 31 min   Charges:   PT Evaluation $PT Eval Low Complexity: 1 Low PT Treatments $Gait Training: 8-22 mins $Therapeutic Exercise: 8-22 mins        Leitha Bleak, PT 02/20/23, 9:28 AM

## 2023-02-20 NOTE — TOC Progression Note (Signed)
Transition of Care [facility]) - Progression Note    Patient Details  Name: Jonathon Snow MRN: [id number] Date of Birth: Oct 16, 1961  Transition of Care [facility]) CM/SW [place], RN Phone Number: 02/20/2023, 9:40 AM  Clinical Narrative:    Spoke with the patient, he is not aware if his employer has filed workmans comp or not, I explained to him that because this is a work related injury he would need to use the [employer] comp to pay for [facility] and DME, He will call his employer and find out if they have filed it and the name and number of the [employer] comp Science writer. I will get back with him in a while after he has had time to gather that information   Expected Discharge Plan: OP Rehab Barriers to Discharge: No [place] will accept this patient  Expected Discharge Plan and Services       Living arrangements for the past 2 months: Single Family Home                                       Social Determinants of Health (SDOH) Interventions SDOH Screenings   Food Insecurity: No Food Insecurity (02/19/2023)  Housing: Low Risk  (02/19/2023)  Transportation Needs: No Transportation Needs (02/19/2023)  Utilities: Not At Risk (02/19/2023)  Alcohol Screen: Low Risk  (10/29/2022)  Depression (PHQ2-9): Low Risk  (02/03/2023)  Tobacco Use: Low Risk  (02/19/2023)    Readmission Risk Interventions     No data to display

## 2023-02-23 ENCOUNTER — Telehealth: Payer: Self-pay

## 2023-02-23 NOTE — Transitions of Care (Post Inpatient/ED Visit) (Unsigned)
   02/23/2023  Name: Jonathon Snow MRN: [id number] DOB: 1961/03/13  Today's TOC FU Call Status: Today's TOC FU Call Status:: Unsuccessul Call (1st Attempt) Unsuccessful Call (1st Attempt) Date: 02/23/23  Attempted to reach the patient regarding the most recent Inpatient/ED visit.  Follow Up Plan: Additional outreach attempts will be made to reach the patient to complete the Transitions of Care (Post Inpatient/ED visit) call.   Signature Juanda Crumble, [place] Direct Dial [phone]

## 2023-02-24 NOTE — Transitions of Care (Post Inpatient/ED Visit) (Unsigned)
   02/24/2023  Name: Jonathon Snow MRN: [id number] DOB: [birth year]  Today's TOC FU Call Status: Today's TOC FU Call Status:: Unsuccessful Call (2nd Attempt) Unsuccessful Call (1st Attempt) Date: 02/23/23 Unsuccessful Call (2nd Attempt) Date: 02/24/23  Attempted to reach the patient regarding the most recent Inpatient/ED visit.  Follow Up Plan: Additional outreach attempts will be made to reach the patient to complete the Transitions of Care (Post Inpatient/ED visit) call.   Signature Juanda Crumble, [place] Direct Dial [phone]

## 2023-02-25 ENCOUNTER — Other Ambulatory Visit: Payer: Self-pay | Admitting: Urology

## 2023-02-25 NOTE — Transitions of Care (Post Inpatient/ED Visit) (Signed)
   02/25/2023  Name: Jonathon Snow MRN: [id number] DOB: April 01, 1961  Today's TOC FU Call Status: Today's TOC FU Call Status:: Unsuccessful Call (3rd Attempt) Unsuccessful Call (1st Attempt) Date: 02/23/23 Unsuccessful Call (2nd Attempt) Date: 02/24/23 Unsuccessful Call (3rd Attempt) Date: 02/25/23  Attempted to reach the patient regarding the most recent Inpatient/ED visit.  Follow Up Plan: No further outreach attempts will be made at this time. We have been unable to contact the patient.  Signature Juanda Crumble, [place] Direct Dial [phone]

## 2023-03-06 DIAGNOSIS — S72002D Fracture of unspecified part of neck of left femur, subsequent encounter for closed fracture with routine healing: Secondary | ICD-10-CM | POA: Diagnosis not present

## 2023-03-10 ENCOUNTER — Ambulatory Visit: Payer: BC Managed Care – PPO | Admitting: Family Medicine

## 2023-03-16 ENCOUNTER — Ambulatory Visit: Payer: Self-pay | Admitting: Family Medicine

## 2023-03-16 DIAGNOSIS — E782 Mixed hyperlipidemia: Secondary | ICD-10-CM

## 2023-03-16 DIAGNOSIS — F321 Major depressive disorder, single episode, moderate: Secondary | ICD-10-CM

## 2023-03-16 DIAGNOSIS — Z5181 Encounter for therapeutic drug level monitoring: Secondary | ICD-10-CM

## 2023-03-16 DIAGNOSIS — R7303 Prediabetes: Secondary | ICD-10-CM

## 2023-03-16 DIAGNOSIS — I10 Essential (primary) hypertension: Secondary | ICD-10-CM

## 2023-03-25 ENCOUNTER — Ambulatory Visit: Payer: BC Managed Care – PPO | Admitting: Urology

## 2023-03-25 VITALS — BP 129/76 | HR 86 | Ht 74.0 in | Wt 235.1 lb

## 2023-03-25 DIAGNOSIS — N401 Enlarged prostate with lower urinary tract symptoms: Secondary | ICD-10-CM

## 2023-03-25 DIAGNOSIS — R3129 Other microscopic hematuria: Secondary | ICD-10-CM | POA: Diagnosis not present

## 2023-03-25 DIAGNOSIS — R350 Frequency of micturition: Secondary | ICD-10-CM | POA: Diagnosis not present

## 2023-03-25 LAB — URINALYSIS, COMPLETE
Bilirubin, UA: NEGATIVE
Glucose, UA: NEGATIVE
Ketones, UA: NEGATIVE
Leukocytes,UA: NEGATIVE
Nitrite, UA: NEGATIVE
Specific Gravity, UA: 1.02 (ref 1.005–1.030)
Urobilinogen, Ur: 0.2 mg/dL (ref 0.2–1.0)
pH, UA: 5 (ref 5.0–7.5)

## 2023-03-25 LAB — MICROSCOPIC EXAMINATION

## 2023-03-25 MED ORDER — SILDENAFIL CITRATE 20 MG PO TABS: ORAL_TABLET | ORAL | 5 refills | Status: DC

## 2023-03-25 NOTE — Progress Notes (Signed)
  03/25/23  Chief Complaint  Patient presents with   Cysto     HPI: 62 y.o. year-old male with refractory urinary symptoms related to BPH who presents today to the office for cystoscopy and prostate sizing   Please see previous notes for details.     Blood pressure 129/76, pulse 86, height 6\' 2"  (1.88 m), weight 235 lb 2 oz (106.7 kg). NED. A&Ox3.   No respiratory distress   Abd soft, NT, ND Normal phallus with bilateral descended testicles    Cystoscopy Procedure Note  Patient identification was confirmed, informed consent was obtained, and patient was prepped using Betadine solution.  Lidocaine jelly was administered per urethral meatus.    Preoperative abx where received prior to procedure.     Pre-Procedure: - Inspection reveals a normal caliber ureteral meatus.  Procedure: The flexible cystoscope was introduced without difficulty - No urethral strictures/lesions are present. - Enlarged prostate bilobar coaptation, tight prostatic fossa - Normal bladder neck - Bilateral ureteral orifices identified - Bladder mucosa  reveals no ulcers, tumors, or lesions - No bladder stones - Minimal trabeculation  Retroflexion shows unremarkable   Post-Procedure: - Patient tolerated the procedure well   Prostate transrectal ultrasound sizing   Informed consent was obtained after discussing risks/benefits of the procedure.  A time out was performed to ensure correct patient identity.   Pre-Procedure: -Transrectal probe was placed without difficulty -Transrectal Ultrasound performed revealing a 33.62 gm prostate measuring 3.07 x 4.17 x 5.02 cm (length) -No significant hypoechoic or median lobe noted      Assessment/ Plan:   1. Benign prostatic hyperplasia with urinary frequency Mildly enlarged prostate with relatively tight prostatic fossa  Continues to have poorly controlled urinary symptoms despite Flomax and oxybutynin 15 mg XL  He may be a good candidate for  outlet procedure including UroLift which was discussed at length today.  He was provided with literature.  Risk and benefits including pelvic pain, infection, functional migration, need for repeat procedure amongst others were all discussed.  He may consider this and will let us know.  Otherwise, his prescriptions were refilled today.   - PSA; Future - Ultrasound renal complete; Future  2. Microscopic hematuria No microscopic hematuria, due to report cells per high-power field with an otherwise unremarkable UA  No evidence of disease in the bladder  Given his relatively low risk in the absence of smoking history, we will plan for renal ultrasound and escalate as needed.  He is agreeable this plan.  Will call with results.  - Ultrasound renal complete; Future  F/u 1 year for PSA/ DRe/ IPSS/ PVR or sooner as needed  Hollice Espy, MD

## 2023-03-27 ENCOUNTER — Ambulatory Visit
Admission: RE | Admit: 2023-03-27 | Discharge: 2023-03-27 | Disposition: A | Discharge status: Home / Self Care | Payer: BLUE CROSS/BLUE SHIELD | Source: Ambulatory Visit | Attending: Urology | Admitting: Urology

## 2023-03-27 DIAGNOSIS — R350 Frequency of micturition: Secondary | ICD-10-CM | POA: Insufficient documentation

## 2023-03-27 DIAGNOSIS — N401 Enlarged prostate with lower urinary tract symptoms: Secondary | ICD-10-CM | POA: Insufficient documentation

## 2023-03-27 DIAGNOSIS — R3129 Other microscopic hematuria: Secondary | ICD-10-CM | POA: Diagnosis not present

## 2023-03-27 DIAGNOSIS — N200 Calculus of kidney: Secondary | ICD-10-CM | POA: Diagnosis not present

## 2023-03-30 ENCOUNTER — Ambulatory Visit: Payer: Self-pay | Admitting: Family Medicine

## 2023-03-30 DIAGNOSIS — E782 Mixed hyperlipidemia: Secondary | ICD-10-CM

## 2023-03-30 DIAGNOSIS — F321 Major depressive disorder, single episode, moderate: Secondary | ICD-10-CM

## 2023-03-30 DIAGNOSIS — I10 Essential (primary) hypertension: Secondary | ICD-10-CM

## 2023-04-01 DIAGNOSIS — S72002D Fracture of unspecified part of neck of left femur, subsequent encounter for closed fracture with routine healing: Secondary | ICD-10-CM | POA: Diagnosis not present

## 2023-04-02 ENCOUNTER — Other Ambulatory Visit: Payer: Self-pay | Admitting: Family Medicine

## 2023-04-02 DIAGNOSIS — E782 Mixed hyperlipidemia: Secondary | ICD-10-CM

## 2023-04-03 ENCOUNTER — Encounter: Payer: Self-pay | Admitting: Internal Medicine

## 2023-04-03 ENCOUNTER — Inpatient Hospital Stay: Payer: BLUE CROSS/BLUE SHIELD

## 2023-04-03 ENCOUNTER — Inpatient Hospital Stay: Payer: BLUE CROSS/BLUE SHIELD | Admitting: Internal Medicine

## 2023-04-03 ENCOUNTER — Inpatient Hospital Stay: Payer: BC Managed Care – PPO

## 2023-04-03 ENCOUNTER — Inpatient Hospital Stay: Payer: BC Managed Care – PPO | Attending: Internal Medicine | Admitting: Internal Medicine

## 2023-04-03 VITALS — BP 155/90 | HR 83 | Temp 97.7°F | Resp 16 | Ht 74.0 in | Wt 235.2 lb

## 2023-04-03 DIAGNOSIS — N183 Chronic kidney disease, stage 3 unspecified: Secondary | ICD-10-CM | POA: Insufficient documentation

## 2023-04-03 DIAGNOSIS — I825Y2 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: Secondary | ICD-10-CM | POA: Insufficient documentation

## 2023-04-03 DIAGNOSIS — Z7901 Long term (current) use of anticoagulants: Secondary | ICD-10-CM | POA: Insufficient documentation

## 2023-04-03 DIAGNOSIS — N401 Enlarged prostate with lower urinary tract symptoms: Secondary | ICD-10-CM

## 2023-04-03 LAB — COMPREHENSIVE METABOLIC PANEL
ALT: 17 U/L (ref 0–44)
AST: 17 U/L (ref 15–41)
Albumin: 4 g/dL (ref 3.5–5.0)
Alkaline Phosphatase: 115 U/L (ref 38–126)
Anion gap: 9 (ref 5–15)
BUN: 30 mg/dL — ABNORMAL HIGH (ref 8–23)
CO2: 23 mmol/L (ref 22–32)
Calcium: 8.6 mg/dL — ABNORMAL LOW (ref 8.9–10.3)
Chloride: 103 mmol/L (ref 98–111)
Creatinine, Ser: 1.51 mg/dL — ABNORMAL HIGH (ref 0.61–1.24)
GFR, Estimated: 52 mL/min — ABNORMAL LOW (ref >60)
Glucose, Bld: 109 mg/dL — ABNORMAL HIGH (ref 70–99)
Potassium: 4.4 mmol/L (ref 3.5–5.1)
Sodium: 135 mmol/L (ref 135–145)
Total Bilirubin: 0.8 mg/dL (ref 0.3–1.2)
Total Protein: 7.6 g/dL (ref 6.5–8.1)

## 2023-04-03 LAB — CBC WITH DIFFERENTIAL/PLATELET
Abs Immature Granulocytes: 0.01 10*3/uL (ref 0.00–0.07)
Basophils Absolute: 0 10*3/uL (ref 0.0–0.1)
Basophils Relative: 0 %
Eosinophils Absolute: 0 10*3/uL (ref 0.0–0.5)
Eosinophils Relative: 0 %
HCT: 47.7 % (ref 39.0–52.0)
Hemoglobin: 15.5 g/dL (ref 13.0–17.0)
Immature Granulocytes: 0 %
Lymphocytes Relative: 9 %
Lymphs Abs: 0.5 10*3/uL — ABNORMAL LOW (ref 0.7–4.0)
MCH: 29.3 pg (ref 26.0–34.0)
MCHC: 32.5 g/dL (ref 30.0–36.0)
MCV: 90.2 fL (ref 80.0–100.0)
Monocytes Absolute: 0.5 10*3/uL (ref 0.1–1.0)
Monocytes Relative: 8 %
Neutro Abs: 4.8 10*3/uL (ref 1.7–7.7)
Neutrophils Relative %: 83 %
Platelets: 193 10*3/uL (ref 150–400)
RBC: 5.29 MIL/uL (ref 4.22–5.81)
RDW: 14.7 % (ref 11.5–15.5)
WBC: 5.8 10*3/uL (ref 4.0–10.5)
nRBC: 0 % (ref 0.0–0.2)

## 2023-04-03 NOTE — Telephone Encounter (Signed)
Patient is inquiring about his medications, patient has been out of medication for three weeks he said. Please advise

## 2023-04-03 NOTE — Progress Notes (Signed)
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 SLE/Nephritis [cellcept/prednisolone;~creat 1.5; Dr.Lateef]  Oncology History   No history exists.     HISTORY OF PRESENTING ILLNESS: Alone.  Ambulating independently.  Jonathon Snow 62 y.o.  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  Recently patient had a fall at work had a hip fracture. S/p ORIF.   Patient has not had any blood clots.  Denies any interruptions in his Eliquis.  States his lupus is medically well.  No blood in stools or black-colored stools.  Review of Systems  Constitutional:  Positive for malaise/fatigue. Negative for chills, diaphoresis, fever and weight loss.  HENT:  Negative for nosebleeds and sore throat.   Eyes:  Negative for double vision.  Respiratory:  Negative for cough, hemoptysis, sputum production, shortness of breath and wheezing.   Cardiovascular:  Positive for leg swelling. Negative for chest pain, palpitations and orthopnea.  Gastrointestinal:  Negative for abdominal pain, blood in stool, constipation, diarrhea, heartburn, melena, nausea and vomiting.  Genitourinary:  Negative for dysuria, frequency and urgency.  Musculoskeletal:  Negative for back pain and joint pain.   Skin:  Positive for rash.  Neurological:  Negative for dizziness, tingling, focal weakness, weakness and headaches.  Endo/Heme/Allergies:  Does not bruise/bleed easily.  Psychiatric/Behavioral:  Negative for depression. The patient is not nervous/anxious and does not have insomnia.      MEDICAL HISTORY:  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   Overview:  superior sagittal sinus, left transverse sinus and cortical veins    COVID-19 virus infection 12/2020   Depression    DVT (deep venous thrombosis)    Dyspnea    GERD (gastroesophageal reflux disease)    Heel spur, left 02/16/2019   Heel spur, right 02/16/2019   Herpes zoster infection of lumbar region 02/20/2020   Hyperlipidemia    Hypertension    Lupus    Lymphedema 10/07/2018   Morbid obesity    Opiate abuse, episodic 02/26/2018   Osteoporosis    Pneumonia    PONV (postoperative nausea and vomiting)    Postphlebitic syndrome with ulcer, left 11/18/2016   Presence of IVC filter 03/22/2020   Removed   Pulmonary embolism    Renal disorder    Stage III    SURGICAL HISTORY: Past Surgical History:  Procedure Laterality Date   ANKLE SURGERY Right    BACK SURGERY     BRONCHIAL WASHINGS N/A 11/01/2021   Procedure: BRONCHIAL WASHINGS;  Surgeon: Vida Rigger, MD;  Location: [facility] ORS;  Service: Thoracic;  Laterality: N/A;   COLONOSCOPY WITH PROPOFOL N/A 05/28/2020   Procedure: COLONOSCOPY WITH PROPOFOL;  Surgeon: Wyline Mood, MD;  Location[facility] ENDOSCOPY;  Service: Endoscopy;  Laterality: N/A;   CYST EXCISION  92 or 93  Liver cyst removal [facility]   FLEXIBLE BRONCHOSCOPY N/A 11/01/2021   Procedure: FLEXIBLE BRONCHOSCOPY;  Surgeon: Vida Rigger, MD;  Location: [facility] ORS;  Service: Thoracic;  Laterality: N/A;   HIP PINNING,CANNULATED Left 02/19/2023   Procedure: PERCUTANEOUS FIXATION OF FEMORAL NECK;  Surgeon: Signa Kell, MD;  Location: [facility] ORS;   Service: Orthopedics;  Laterality: Left;   I & D EXTREMITY Right 04/29/2017   Procedure: IRRIGATION AND DEBRIDEMENT EXTREMITY;  Surgeon: Ricarda Frame, MD;  Location: [facility] ORS;  Service: General;  Laterality: Right;   IRRIGATION AND DEBRIDEMENT ABSCESS Left 04/29/2017   Procedure: IRRIGATION AND DEBRIDEMENT Scrotal ABSCESS;  Surgeon: Ricarda Frame, MD;  Location: [facility] ORS;  Service: General;  Laterality: Left;    SOCIAL HISTORY: Social History   Socioeconomic History   Marital status: Divorced    Spouse name: Not on file   Number of children: Not on file   Years of education: Not on file   Highest education level: Not on file  Occupational History   Not on file  Tobacco Use   Smoking status: Never   Smokeless tobacco: Never  Vaping Use   Vaping Use: Never used  Substance and Sexual Activity   Alcohol use: No   Drug use: No    Comment: PT DENIES   Sexual activity: Yes  Other Topics Concern   Not on file  Social History Narrative   Not on file   Social Determinants of Health   Financial Resource Strain: Not on file  Food Insecurity: No Food Insecurity (02/19/2023)   Hunger Vital Sign    Worried About Running Out of Food in the Last Year: Never true    Ran Out of Food in the Last Year: Never true  Transportation Needs: No Transportation Needs (02/19/2023)   PRAPARE - Administrator, Civil Service (Medical): No    Lack of Transportation (Non-Medical): No  Physical Activity: Not on file  Stress: Not on file  Social Connections: Not on file  Intimate Partner Violence: Not At Risk (02/19/2023)   Humiliation, Afraid, Rape, and Kick questionnaire    Fear of Current or Ex-Partner: No    Emotionally Abused: No    Physically Abused: No    Sexually Abused: No    FAMILY HISTORY: Family History  Problem Relation Age of Onset   Hypertension Father    Heart disease Father    Clotting disorder Mother    Kidney disease Brother    Heart attack Maternal  Grandmother    Heart attack Maternal Grandfather    Heart attack Paternal Grandfather     ALLERGIES:  is allergic to enalapril and vicodin [hydrocodone-acetaminophen].  MEDICATIONS:  Current Outpatient Medications  Medication Sig Dispense Refill   albuterol (VENTOLIN HFA) 108 (90 Base) MCG/ACT inhaler Inhale 2 puffs into the lungs every 6 (six) hours as needed for wheezing or shortness of breath. 18 g 2   amLODipine (NORVASC) 5 MG tablet Take 1 tablet (5 mg total) by mouth daily. 90 tablet 3   apixaban (ELIQUIS) 5 MG TABS tablet Take 1 tablet (5 mg total) by mouth 2 (two) times daily. 60 tablet 6   cholecalciferol (VITAMIN D3) 25 MCG (1000 UNIT) tablet Take 1,000 Units by mouth daily.     levocetirizine (XYZAL) 5 MG tablet Take 1 tablet (5 mg total) by mouth every evening. 30 tablet 5   losartan (COZAAR) 100 MG tablet Take 1 tablet (100 mg total) by mouth daily. 90 tablet 1   meclizine (ANTIVERT)  25 MG tablet Take 0.5-1 tablets (12.5-25 mg total) by mouth 3 (three) times daily as needed for dizziness. 30 tablet 1   montelukast (SINGULAIR) 10 MG tablet Take 1 tablet (10 mg total) by mouth at bedtime. 90 tablet 3   Multiple Vitamins-Minerals (MULTIVITAMIN WITH MINERALS) tablet Take 1 tablet by mouth daily.     mycophenolate (CELLCEPT) 500 MG tablet Take 1,000 mg by mouth 2 (two) times daily.     Olopatadine-Mometasone (RYALTRIS) X543819 MCG/ACT SUSP Place 2 sprays into the nose 2 (two) times daily as needed. 29 g 2   ondansetron (ZOFRAN) 4 MG tablet Take 1 tablet (4 mg total) by mouth every 6 (six) hours as needed for nausea. 20 tablet 0   oxyCODONE (OXY IR/ROXICODONE) 5 MG immediate release tablet Take 0.5-1 tablets (2.5-5 mg total) by mouth every 4 (four) hours as needed for moderate pain (pain score 4-6). 30 tablet 0   predniSONE (DELTASONE) 10 MG tablet Take 1 tablet by mouth daily.     rosuvastatin (CRESTOR) 10 MG tablet Take 1 tablet by mouth once daily 90 tablet 0   sildenafil (REVATIO)  20 MG tablet TAKE 3 TO 5 TABLETS BY MOUTH AS NEEDED 30 MIN PRIOR TO INTERCOURSE 30 tablet 5   traMADol (ULTRAM) 50 MG tablet Take 1 tablet (50 mg total) by mouth every 6 (six) hours as needed for moderate pain. 30 tablet 0   gabapentin (NEURONTIN) 300 MG capsule Take 1 capsule by mouth 3 (three) times daily. (Patient not taking: Reported on 04/03/2023)     promethazine-dextromethorphan (PROMETHAZINE-DM) 6.25-15 MG/5ML syrup Take 5 mLs by mouth 4 (four) times daily as needed for cough. (Patient not taking: Reported on 04/03/2023) 118 mL 0   No current facility-administered medications for this visit.       PHYSICAL EXAMINATION:  Vitals:   04/03/23 1301  BP: (!) 155/90  Pulse: 83  Resp: 16  Temp: 97.7 F (36.5 C)  SpO2: 95%   Filed Weights   04/03/23 1301  Weight: 235 lb 3.2 oz (106.7 kg)    Physical Exam Vitals and nursing note reviewed.  HENT:     Head: Normocephalic and atraumatic.     Mouth/Throat:     Pharynx: Oropharynx is clear.  Eyes:     Extraocular Movements: Extraocular movements intact.     Pupils: Pupils are equal, round, and reactive to light.  Cardiovascular:     Rate and Rhythm: Normal rate and regular rhythm.  Pulmonary:     Comments: Decreased breath sounds bilaterally.  Abdominal:     Palpations: Abdomen is soft.  Musculoskeletal:        General: Normal range of motion.     Cervical back: Normal range of motion.  Skin:    General: Skin is warm.  Neurological:     General: No focal deficit present.     Mental Status: He is alert and oriented to person, place, and time.  Psychiatric:        Behavior: Behavior normal.        Judgment: Judgment normal.      LABORATORY DATA:  I have reviewed the data as listed Lab Results  Component Value Date   WBC 5.8 04/03/2023   HGB 15.5 04/03/2023   HCT 47.7 04/03/2023   MCV 90.2 04/03/2023   PLT 193 04/03/2023   Recent Labs    08/19/22 1555 10/01/22 1031 02/18/23 2214 02/19/23 0409 02/20/23 0259  04/03/23 1253  NA  --  137   < >  138 136 135  K  --  4.2   < > 3.7 4.8 4.4  CL  --  107   < > 108 108 103  CO2  --  24   < > 20* 21* 23  GLUCOSE  --  108*   < > 105* 147* 109*  BUN  --  24*   < > 30* 34* 30*  CREATININE  --  1.37*   < > 1.48* 1.62* 1.51*  CALCIUM  --  8.8*   < > 7.9* 8.2* 8.6*  GFRNONAA  --  59*   < > 53* 48* 52*  PROT 6.4 7.2  --   --   --  7.6  ALBUMIN 4.0 3.7  --   --   --  4.0  AST 15 19  --   --   --  17  ALT 14 21  --   --   --  17  ALKPHOS 94 87  --   --   --  115  BILITOT 0.4 0.7  --   --   --  0.8  BILIDIR 0.13  --   --   --   --   --    < > = values in this interval not displayed.    RADIOGRAPHIC STUDIES: I have personally reviewed the radiological images as listed and agreed with the findings in the report. Ultrasound renal complete  Result Date: 03/29/2023 CLINICAL DATA:  Microscopic hematuria EXAM: RENAL / URINARY TRACT ULTRASOUND COMPLETE COMPARISON:  CT abdomen and pelvis dated October 18, 2017 FINDINGS: Right Kidney: Renal measurements: 11.3 x 6.0 x 5.7 cm = volume: 203.1 mL. Increased parenchymal echogenicity. No mass or hydronephrosis visualized. Multiple shadowing calculi, largest measures 5.8 mm. Left Kidney: Renal measurements: 0.8 x 5.5 x 4.5 cm = volume: 126.3 mL. Increased parenchymal echogenicity. No mass or hydronephrosis visualized. Multiple shadowing calculi, largest measures 4.3 mm. Bladder: Appears normal for degree of bladder distention. Bilateral jets seen. Other: Partially visualized large septated right hepatic cyst measuring 8.4 x 7.5 x 7.3 cm, previously seen on prior CT. IMPRESSION: 1. Bilateral nephrolithiasis. No hydronephrosis. 2. Increased parenchymal echogenicity, findings can be seen in the setting of medical renal disease. Electronically Signed   By: Allegra Lai M.D.   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 Tolerating well.  Refill Eliquis. stable   # Mild chronic intermittent thrombocytopenia- ? Sec to immune process- monitor for now. OCT 2023- [[facility]]-platelets normal- stable   # History of nephritis/lupus/ CKD stage III-cellcept+prednisone following up with nephrology. Stable.   #DISPOSITION: #  follow up in 6 months- MD;/labs- cbc/cmp-Dr.B      All questions were answered. The patient knows to call the clinic with any problems, questions or concerns.    Earna Coder, MD 04/03/2023 1:18 PM

## 2023-04-03 NOTE — Assessment & Plan Note (Addendum)
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 Tolerating well.  Refill Eliquis. stable   # Mild chronic intermittent thrombocytopenia- ? Sec to immune process- monitor for now. OCT 2023- [[facility]]-platelets normal- stable   # History of nephritis/lupus/ CKD stage III-cellcept+prednisone following up with nephrology. Stable.   #DISPOSITION: #  follow up in 6 months- MD;/labs- cbc/cmp-Dr.B

## 2023-04-03 NOTE — Progress Notes (Signed)
Patient has no concerns today. 

## 2023-04-06 ENCOUNTER — Other Ambulatory Visit: Payer: Self-pay

## 2023-04-06 ENCOUNTER — Telehealth: Payer: Self-pay

## 2023-04-06 DIAGNOSIS — Z86010 Personal history of colonic polyps: Secondary | ICD-10-CM

## 2023-04-06 MED ORDER — NA SULFATE-K SULFATE-MG SULF 17.5-3.13-1.6 GM/177ML PO SOLN: 354.0000 mL | Freq: Once | ORAL | 0 refills | Status: AC

## 2023-04-06 NOTE — Telephone Encounter (Signed)
Patient states he got a notification it was time to schedule his repeat colonoscopy. Patient would like to get this schedule.

## 2023-04-07 ENCOUNTER — Ambulatory Visit (INDEPENDENT_AMBULATORY_CARE_PROVIDER_SITE_OTHER): Payer: BC Managed Care – PPO | Admitting: Family Medicine

## 2023-04-07 ENCOUNTER — Encounter: Payer: Self-pay | Admitting: Family Medicine

## 2023-04-07 VITALS — BP 130/72 | HR 86 | Temp 97.9°F | Resp 16 | Ht 74.0 in | Wt 236.0 lb

## 2023-04-07 DIAGNOSIS — I825Y2 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: Secondary | ICD-10-CM

## 2023-04-07 DIAGNOSIS — F321 Major depressive disorder, single episode, moderate: Secondary | ICD-10-CM | POA: Diagnosis not present

## 2023-04-07 DIAGNOSIS — I10 Essential (primary) hypertension: Secondary | ICD-10-CM | POA: Diagnosis not present

## 2023-04-07 DIAGNOSIS — R7303 Prediabetes: Secondary | ICD-10-CM | POA: Diagnosis not present

## 2023-04-07 DIAGNOSIS — Q446 Cystic disease of liver: Secondary | ICD-10-CM

## 2023-04-07 DIAGNOSIS — E782 Mixed hyperlipidemia: Secondary | ICD-10-CM

## 2023-04-07 DIAGNOSIS — Z79899 Other long term (current) drug therapy: Secondary | ICD-10-CM

## 2023-04-07 DIAGNOSIS — K769 Liver disease, unspecified: Secondary | ICD-10-CM

## 2023-04-07 MED ORDER — LOSARTAN POTASSIUM 100 MG PO TABS
100.0000 mg | ORAL_TABLET | Freq: Every day | ORAL | 1 refills | Status: DC
Start: 2023-04-07 — End: 2023-11-13

## 2023-04-07 MED ORDER — AMLODIPINE BESYLATE 5 MG PO TABS
5.0000 mg | ORAL_TABLET | Freq: Every day | ORAL | 1 refills | Status: DC
Start: 2023-04-07 — End: 2023-11-13

## 2023-04-07 MED ORDER — ROSUVASTATIN CALCIUM 10 MG PO TABS: 10.0000 mg | ORAL_TABLET | Freq: Every day | ORAL | 3 refills | Status: DC

## 2023-04-07 NOTE — Progress Notes (Signed)
Name: Jonathon Snow   MRN: [id number]    DOB: 1961-06-13   Date:04/07/2023       Progress Note  Chief Complaint  Patient presents with   Follow-up   Hypertension   Hyperlipidemia   Depression     Subjective:   Jonathon Snow is a 62 y.o. male, presents to clinic for   F/up on liver cyst/lesion - appears stable over several years with imaging, recent [facility] noted as well    [facility] - March 2024 Partially visualized multilobular complex cystic lesion of the liver. Dedicated liver CT pre and postcontrast could be obtained for further characterization if indicated.   08/22/2021 -   Upper Abdomen: Mildly complex cyst in the right hepatic lobe with calcified septations is grossly stable at 9.7 x 8.2 cm.  In 1980's he had it found and fluid drained off of it     Lab Results  Component Value Date   HGBA1C 5.5 09/09/2022    BP Readings from Last 10 Encounters:  04/07/23 130/72  04/03/23 (!) 155/90  03/25/23 129/76  02/20/23 133/68  02/13/23 136/84  02/03/23 (!) 150/82  01/13/23 (!) 157/93  12/08/22 (!) 166/84  10/01/22 (!) 138/91  09/09/22 114/76         Current Outpatient Medications:    albuterol (VENTOLIN HFA) 108 (90 Base) MCG/ACT inhaler, Inhale 2 puffs into the lungs every 6 (six) hours as needed for wheezing or shortness of breath., Disp: 18 g, Rfl: 2   amLODipine (NORVASC) 5 MG tablet, Take 1 tablet (5 mg total) by mouth daily., Disp: 90 tablet, Rfl: 3   apixaban (ELIQUIS) 5 MG TABS tablet, Take 1 tablet (5 mg total) by mouth 2 (two) times daily., Disp: 60 tablet, Rfl: 6   cholecalciferol (VITAMIN D3) 25 MCG (1000 UNIT) tablet, Take 1,000 Units by mouth daily., Disp: , Rfl:    gabapentin (NEURONTIN) 300 MG capsule, Take 1 capsule by mouth 3 (three) times daily., Disp: , Rfl:    levocetirizine (XYZAL) 5 MG tablet, Take 1 tablet (5 mg total) by mouth every evening., Disp: 30 tablet, Rfl: 5   losartan (COZAAR) 100 MG tablet, Take 1 tablet (100 mg  total) by mouth daily., Disp: 90 tablet, Rfl: 1   montelukast (SINGULAIR) 10 MG tablet, Take 1 tablet (10 mg total) by mouth at bedtime., Disp: 90 tablet, Rfl: 3   Multiple Vitamins-Minerals (MULTIVITAMIN WITH MINERALS) tablet, Take 1 tablet by mouth daily., Disp: , Rfl:    mycophenolate (CELLCEPT) 500 MG tablet, Take 1,000 mg by mouth 2 (two) times daily., Disp: , Rfl:    Olopatadine-Mometasone (RYALTRIS) 665-25 MCG/ACT SUSP, Place 2 sprays into the nose 2 (two) times daily as needed., Disp: 29 g, Rfl: 2   ondansetron (ZOFRAN) 4 MG tablet, Take 1 tablet (4 mg total) by mouth every 6 (six) hours as needed for nausea., Disp: 20 tablet, Rfl: 0   oxyCODONE (OXY IR/ROXICODONE) 5 MG immediate release tablet, Take 0.5-1 tablets (2.5-5 mg total) by mouth every 4 (four) hours as needed for moderate pain (pain score 4-6)., Disp: 30 tablet, Rfl: 0   predniSONE (DELTASONE) 10 MG tablet, Take 1 tablet by mouth daily., Disp: , Rfl:    promethazine-dextromethorphan (PROMETHAZINE-DM) 6.25-15 MG/5ML syrup, Take 5 mLs by mouth 4 (four) times daily as needed for cough., Disp: 118 mL, Rfl: 0   rosuvastatin (CRESTOR) 10 MG tablet, Take 1 tablet by mouth once daily, Disp: 90 tablet, Rfl: 0   sildenafil (REVATIO) 20  MG tablet, TAKE 3 TO 5 TABLETS BY MOUTH AS NEEDED 30 MIN PRIOR TO INTERCOURSE, Disp: 30 tablet, Rfl: 5   traMADol (ULTRAM) 50 MG tablet, Take 1 tablet (50 mg total) by mouth every 6 (six) hours as needed for moderate pain., Disp: 30 tablet, Rfl: 0   meclizine (ANTIVERT) 25 MG tablet, Take 0.5-1 tablets (12.5-25 mg total) by mouth 3 (three) times daily as needed for dizziness. (Patient not taking: Reported on 04/07/2023), Disp: 30 tablet, Rfl: 1  Patient Active Problem List   Diagnosis Date Noted   SLE (systemic lupus erythematosus related syndrome) 02/19/2023   Closed left hip fracture, initial encounter 02/18/2023   Pain and swelling of right lower extremity 02/03/2023   Erectile disorder 01/22/2023    Coronary artery disease of native artery of native heart with stable angina pectoris 09/09/2022   Current moderate episode of major depressive disorder 09/09/2022   Varicose 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 01/03/2022   Immunocompromised state due to drug therapy 09/18/2021   Prediabetes 11/08/2020   Stage 3a chronic kidney disease 11/08/2020   Seasonal allergies 11/08/2020   Rhinosinusitis 11/08/2020   Long term current use of systemic steroids 11/08/2020   Anticoagulant disorder 11/07/2020   Senile purpura 11/07/2020   History of DVT (deep vein thrombosis) 03/22/2020   MDD (major depressive disorder), recurrent episode, mild 09/12/2019   Other spondylosis with radiculopathy, lumbar region 02/16/2019   Osteoporosis 10/12/2018   Chronic venous insufficiency 10/07/2018   Lymphedema 10/07/2018   SLE glomerulonephritis syndrome, WHO class V 03/03/2018   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 10/29/2016   Mixed hyperlipidemia 01/15/2016   Primary hypertension 01/15/2016   Obesity (BMI 30.0-34.9) 01/15/2016   Long term current use of anticoagulant 10/04/2015   Systemic lupus erythematosus 05/30/2015   COPD, moderate 06/27/2014   History of pulmonary embolism 12/11/2013   Nodule of right lung 12/11/2013   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   Cystic disease of liver 08/21/2013    Past Surgical History:  Procedure Laterality Date   ANKLE SURGERY Right    BACK SURGERY     BRONCHIAL WASHINGS N/A 11/01/2021   Procedure: BRONCHIAL WASHINGS;  Surgeon: Vida Rigger, MD;  Location: [facility] ORS;  Service: Thoracic;  Laterality: N/A;   COLONOSCOPY WITH PROPOFOL N/A 05/28/2020   Procedure: COLONOSCOPY WITH PROPOFOL;  Surgeon: Wyline Mood, MD;  Location: [facility] ENDOSCOPY;  Service: Endoscopy;  Laterality: N/A;   CYST EXCISION  92 or 93    Liver cyst removal [facility]   FLEXIBLE BRONCHOSCOPY N/A 11/01/2021   Procedure: FLEXIBLE  BRONCHOSCOPY;  Surgeon: Vida Rigger, MD;  Location: [facility]S;  Service: Thoracic;  Laterality: N/A;   HIP PINNING,CANNULATED Left 02/19/2023   Procedure: PERCUTANEOUS FIXATION OF FEMORAL NECK;  Surgeon: Signa Kell, MD;  Location: [facility];  Service: Orthopedics;  Laterality: Left;   I & D EXTREMITY Right 04/29/2017   Procedure: IRRIGATION AND DEBRIDEMENT EXTREMITY;  Surgeon: Ricarda Frame, MD;  Location: [facility]S;  Service: General;  Laterality: Right;   IRRIGATION AND DEBRIDEMENT ABSCESS Left 04/29/2017   Procedure: IRRIGATION AND DEBRIDEMENT Scrotal ABSCESS;  Surgeon: Ricarda Frame, MD;  Location: [facility];  Service: General;  Laterality: Left;    Family History  Problem Relation Age of Onset   Hypertension Father    Heart disease Father    Clotting disorder Mother    Kidney disease Brother    Heart attack Maternal Grandmother    Heart attack Maternal  Grandfather    Heart attack Paternal Grandfather     Social History   Tobacco Use   Smoking status: Never   Smokeless tobacco: Never  Vaping Use   Vaping Use: Never used  Substance Use Topics   Alcohol use: No   Drug use: No    Comment: PT DENIES     Allergies  Allergen Reactions   Enalapril Other (See Comments)    Unknown reaction   Vicodin [Hydrocodone-Acetaminophen] Hives and Rash    Severe headaches (also) NAME BRAND ONLY PER PT CAN TAKE GENERIC    Health Maintenance  Topic Date Due   COVID-19 Vaccine (6 - 2023-24 season) 04/23/2023 (Originally 08/22/2022)   Zoster Vaccines- Shingrix (1 of 2) 07/07/2023 (Originally 12/11/1980)   COLONOSCOPY (Pts 45-71yrs Insurance coverage will need to be confirmed)  05/29/2023   INFLUENZA VACCINE  07/23/2023   DTaP/Tdap/Td (2 - Td or Tdap) 08/09/2031   Hepatitis C Screening  Completed   HIV Screening  Completed   HPV VACCINES  Aged Out    Chart Review Today: I personally reviewed active problem list, medication list, allergies, family history, social history, health  maintenance, notes from last encounter, lab results, imaging with the patient/caregiver today.   Review of Systems  Constitutional: Negative.   HENT: Negative.    Eyes: Negative.   Respiratory: Negative.    Cardiovascular: Negative.   Gastrointestinal: Negative.   Endocrine: Negative.   Genitourinary: Negative.   Musculoskeletal: Negative.   Skin: Negative.   Allergic/Immunologic: Negative.   Neurological: Negative.   Hematological: Negative.   Psychiatric/Behavioral: Negative.    All other systems reviewed and are negative.    Objective:   Vitals:   04/07/23 0942  BP: 130/72  Pulse: 86  Resp: 16  Temp: 97.9 F (36.6 C)  TempSrc: Oral  SpO2: 95%  Weight: 236 lb (107 kg)  Height: 6\' 2"  (1.88 m)    Body mass index is 30.3 kg/m.  Physical Exam Vitals and nursing note reviewed.  Constitutional:      General: He is not in acute distress.    Appearance: Normal appearance. He is obese. He is not ill-appearing (chronically ill appearing), toxic-appearing or diaphoretic.  HENT:     Head: Normocephalic and atraumatic.     Right Ear: External ear normal.     Left Ear: External ear normal.  Eyes:     General: No scleral icterus.       Right eye: No discharge.        Left eye: No discharge.     Conjunctiva/sclera: Conjunctivae normal.  Pulmonary:     Effort: Pulmonary effort is normal. No respiratory distress.     Breath sounds: Normal breath sounds. No stridor. No wheezing, rhonchi or rales.  Abdominal:     General: Bowel sounds are normal.     Palpations: Abdomen is soft.  Musculoskeletal:     Right lower leg: No edema.     Left lower leg: No edema.  Skin:    General: Skin is warm and dry.     Capillary Refill: Capillary refill takes less than 2 seconds.     Coloration: Skin is pale (chronically mildly pale). Skin is not jaundiced.  Neurological:     Mental Status: He is alert. Mental status is at baseline.  Psychiatric:        Mood and Affect: Mood normal.          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    Per hem on eliquis long term      Relevant Medications   amLODipine (NORVASC) 5 MG tablet   losartan (COZAAR) 100 MG tablet   rosuvastatin (CRESTOR) 10 MG tablet     Digestive   Cystic disease of liver    Imaging appears stable over several years in our system and when compared to outside imaging through care everywhere Asx, previously worked up        Other   Mixed hyperlipidemia   Relevant Medications   amLODipine (NORVASC) 5 MG tablet   losartan (COZAAR) 100 MG tablet   rosuvastatin (CRESTOR) 10 MG tablet   Prediabetes    Lab Results  Component Value Date   HGBA1C 5.5 09/09/2022  Last A1C normal, will continue to monitor lab glucose readings       Current moderate episode of major depressive disorder       04/07/2023    9:40 AM 02/03/2023   10:16 AM 12/10/2022   10:03 AM  Depression screen PHQ 2/9  Decreased Interest 0 0 0  Down, Depressed, Hopeless 0 0 0  PHQ - 2 Score 0 0 0  Altered sleeping 0 0 0  Tired, decreased energy 0 0 0  Change in appetite 0 0 0  Feeling bad or failure about yourself  0 0 0  Trouble concentrating 0 0 0  Moving slowly or fidgety/restless 0 0 0  Suicidal thoughts 0 0 0  PHQ-9 Score 0 0 0  Difficult doing work/chores Not difficult at all Not difficult at all Not difficult at all  Phq 9 reviewed and negative - last Sept he was on zoloft 25 mg, not currently taking or on list       Other  Visit Diagnoses     Liver lesion       see above   High risk medication use       long term steroid use - prior dexa with need for monitoring, Dexa ordered - reviewed prior dexa in chart/care everywhere   Relevant Orders   DG Bone Density   Hypertension, unspecified type       Blood pressure stable and well-controlled today on losartan 100 mg and amlodipine 5 mg, reviewed labs per care everywhere    Relevant Medications   amLODipine (NORVASC) 5 MG tablet   losartan (COZAAR) 100 MG tablet   rosuvastatin (CRESTOR) 10 MG tablet       Labs recently done and reviewed by me personally - relative at all dx noted above   Return in about 3 months (around 07/07/2023) for Routine follow-up.   Danelle Berry, PA-C 04/07/23 10:02 AM

## 2023-04-10 ENCOUNTER — Telehealth: Payer: Self-pay

## 2023-04-10 ENCOUNTER — Encounter: Payer: Self-pay | Admitting: Urology

## 2023-04-10 NOTE — Telephone Encounter (Signed)
Spoke with patient and scheduled appointment.

## 2023-04-10 NOTE — Telephone Encounter (Signed)
Called patient to let him know that Dr. Donneta Romberg wants him to hold his Eliquis 3 days before his procedure and restart 1 day after his procedure.

## 2023-04-10 NOTE — Progress Notes (Deleted)
04/13/2023 9:49 AM   [name] Mar 09, 1961 [id number]  Referring provider: Danelle Berry, PA-C [address] [place],  [name] [address]  Urological history: 1.  Erectile dysfunction -Contributing factors of age, hypertension, COPD, lupus, CKD, hyperlipidemia, obesity, depression, episodic opioid abuse and anticoagulation therapy -failed PDE5i's   2. BPH with LU TS -PSA (08/2021) 0.42 -cysto (03/2023) - Enlarged prostate bilobar coaptation, tight prostatic fossa - Minimal trabeculation  -prostate volume (03/2023) ~ 34 cc -Tamsulosin 0.4 mg and oxybutynin 15 mg XL  3. Nephrolithiasis -RUS (03/2023) - Bilateral nephrolithiasis   No chief complaint on file.   HPI: Jonathon Snow is a 62 y.o. male who presents today for ED.   Previous records reviewed.     Score: 1-7 Severe ED 8-11 Moderate ED 12-16 Mild-Moderate ED 17-21 Mild ED 22-25 No ED    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   Overview:  superior sagittal sinus, left transverse sinus and cortical veins    COVID-19 virus infection 12/2020   Depression    DVT (deep venous thrombosis)    Dyspnea    GERD (gastroesophageal reflux disease)    Heel spur, left 02/16/2019   Heel spur, right 02/16/2019   Herpes zoster infection of lumbar region 02/20/2020   Hyperlipidemia    Hypertension    Lupus    Lymphedema 10/07/2018   Morbid obesity    Opiate abuse, episodic 02/26/2018   Osteoporosis    Pneumonia    PONV (postoperative nausea and vomiting)    Postphlebitic syndrome with ulcer, left 11/18/2016   Presence of IVC filter 03/22/2020   Removed   Pulmonary embolism    Renal disorder    Stage III    Surgical History: Past Surgical History:  Procedure Laterality Date   ANKLE SURGERY Right    BACK SURGERY     BRONCHIAL WASHINGS N/A 11/01/2021   Procedure: BRONCHIAL WASHINGS;  Surgeon: Vida Rigger,  MD;  Location: [facility];  Service: Thoracic;  Laterality: N/A;   COLONOSCOPY WITH PROPOFOL N/A 05/28/2020   Procedure: COLONOSCOPY WITH PROPOFOL;  Surgeon: Wyline Mood, MD;  Location: [facility] ENDOSCOPY;  Service: Endoscopy;  Laterality: N/A;   CYST EXCISION  92 or 93    Liver cyst removal [facility]   FLEXIBLE BRONCHOSCOPY N/A 11/01/2021   Procedure: FLEXIBLE BRONCHOSCOPY;  Surgeon: Vida Rigger, MD;  Location: [facility];  Service: Thoracic;  Laterality: N/A;   HIP PINNING,CANNULATED Left 02/19/2023   Procedure: PERCUTANEOUS FIXATION OF FEMORAL NECK;  Surgeon: Signa Kell, MD;  Location: [facility];  Service: Orthopedics;  Laterality: Left;   I & D EXTREMITY Right 04/29/2017   Procedure: IRRIGATION AND DEBRIDEMENT EXTREMITY;  Surgeon: Ricarda Frame, MD;  Location: [facility];  Service: General;  Laterality: Right;   IRRIGATION AND DEBRIDEMENT ABSCESS Left 04/29/2017   Procedure: IRRIGATION AND DEBRIDEMENT Scrotal ABSCESS;  Surgeon: Ricarda Frame, MD;  Location: [facility];  Service: General;  Laterality: Left;    Home Medications:  Allergies as of 04/13/2023       Reactions   Enalapril Other (See Comments)   Unknown reaction   Vicodin [hydrocodone-acetaminophen] Hives, Rash   Severe headaches (also) NAME BRAND ONLY PER PT CAN TAKE GENERIC        Medication List        Accurate as of April 10, 2023  9:49 AM. If you have any questions, ask your nurse or doctor.  albuterol 108 (90 Base) MCG/ACT inhaler Commonly known as: VENTOLIN HFA Inhale 2 puffs into the lungs every 6 (six) hours as needed for wheezing or shortness of breath.   amLODipine 5 MG tablet Commonly known as: NORVASC Take 1 tablet (5 mg total) by mouth daily.   apixaban 5 MG Tabs tablet Commonly known as: Eliquis Take 1 tablet (5 mg total) by mouth 2 (two) times daily.   cholecalciferol 25 MCG (1000 UNIT) tablet Commonly known as: VITAMIN D3 Take 1,000 Units by mouth daily.   gabapentin 300 MG  capsule Commonly known as: NEURONTIN Take 1 capsule by mouth 3 (three) times daily.   levocetirizine 5 MG tablet Commonly known as: XYZAL Take 1 tablet (5 mg total) by mouth every evening.   losartan 100 MG tablet Commonly known as: COZAAR Take 1 tablet (100 mg total) by mouth daily.   meclizine 25 MG tablet Commonly known as: ANTIVERT Take 0.5-1 tablets (12.5-25 mg total) by mouth 3 (three) times daily as needed for dizziness.   montelukast 10 MG tablet Commonly known as: SINGULAIR Take 1 tablet (10 mg total) by mouth at bedtime.   multivitamin with minerals tablet Take 1 tablet by mouth daily.   mycophenolate 500 MG tablet Commonly known as: CELLCEPT Take 1,000 mg by mouth 2 (two) times daily.   ondansetron 4 MG tablet Commonly known as: ZOFRAN Take 1 tablet (4 mg total) by mouth every 6 (six) hours as needed for nausea.   oxyCODONE 5 MG immediate release tablet Commonly known as: Oxy IR/ROXICODONE Take 0.5-1 tablets (2.5-5 mg total) by mouth every 4 (four) hours as needed for moderate pain (pain score 4-6).   predniSONE 10 MG tablet Commonly known as: DELTASONE Take 1 tablet by mouth daily.   promethazine-dextromethorphan 6.25-15 MG/5ML syrup Commonly known as: PROMETHAZINE-DM Take 5 mLs by mouth 4 (four) times daily as needed for cough.   rosuvastatin 10 MG tablet Commonly known as: CRESTOR Take 1 tablet (10 mg total) by mouth daily.   Ryaltris 161-09 MCG/ACT Susp Generic drug: Olopatadine-Mometasone Place 2 sprays into the nose 2 (two) times daily as needed.   sildenafil 20 MG tablet Commonly known as: REVATIO TAKE 3 TO 5 TABLETS BY MOUTH AS NEEDED 30 MIN PRIOR TO INTERCOURSE   traMADol 50 MG tablet Commonly known as: ULTRAM Take 1 tablet (50 mg total) by mouth every 6 (six) hours as needed for moderate pain.        Allergies:  Allergies  Allergen Reactions   Enalapril Other (See Comments)    Unknown reaction   Vicodin  [Hydrocodone-Acetaminophen] Hives and Rash    Severe headaches (also) NAME BRAND ONLY PER PT CAN TAKE GENERIC    Family History: Family History  Problem Relation Age of Onset   Hypertension Father    Heart disease Father    Clotting disorder Mother    Kidney disease Brother    Heart attack Maternal Grandmother    Heart attack Maternal Grandfather    Heart attack Paternal Grandfather     Social History:  reports that he has never smoked. He has never used smokeless tobacco. He reports that he does not drink alcohol and does not use drugs.  ROS: Pertinent ROS in HPI  Physical Exam: There were no vitals taken for this visit.  Constitutional:  Well nourished. Alert and oriented, No acute distress. HEENT: [place] AT, moist mucus membranes.  Trachea midline, no masses. Cardiovascular: No clubbing, cyanosis, or edema. Respiratory: Normal respiratory effort, no increased  work of breathing. GI: Abdomen is soft, non tender, non distended, no abdominal masses. Liver and spleen not palpable.  No hernias appreciated.  Stool sample for occult testing is not indicated.   GU: No CVA tenderness.  No bladder fullness or masses.  Patient with circumcised/uncircumcised phallus. ***Foreskin easily retracted***  Urethral meatus is patent.  No penile discharge. No penile lesions or rashes. Scrotum without lesions, cysts, rashes and/or edema.  Testicles are located scrotally bilaterally. No masses are appreciated in the testicles. Left and right epididymis are normal. Rectal: Patient with  normal sphincter tone. Anus and perineum without scarring or rashes. No rectal masses are appreciated. Prostate is approximately *** grams, *** nodules are appreciated. Seminal vesicles are normal. Skin: No rashes, bruises or suspicious lesions. Lymph: No cervical or inguinal adenopathy. Neurologic: Grossly intact, no focal deficits, moving all 4 extremities. Psychiatric: Normal mood and affect.  Laboratory Data: Lab  Results  Component Value Date   WBC 5.8 04/03/2023   HGB 15.5 04/03/2023   HCT 47.7 04/03/2023   MCV 90.2 04/03/2023   PLT 193 04/03/2023    Lab Results  Component Value Date   CREATININE 1.51 (H) 04/03/2023    Lab Results  Component Value Date   PSA 1.0 07/28/2018   PSA 0.6 11/26/2016   PSA 0.4 07/14/2013   Lab Results  Component Value Date   HGBA1C 5.5 09/09/2022       Component Value Date/Time   CHOL 225 (H) 09/09/2022 1040   CHOL 208 (H) 04/17/2016 0924   HDL 56 09/09/2022 1040   HDL 48 04/17/2016 0924   CHOLHDL 4.0 09/09/2022 1040   VLDL 25 10/18/2017 1844   LDLCALC 150 (H) 09/09/2022 1040    Lab Results  Component Value Date   AST 17 04/03/2023   Lab Results  Component Value Date   ALT 17 04/03/2023  I have reviewed the labs.   Pertinent Imaging: N/A  Assessment & Plan:  ***  1. Erectile dysfunction - I explained to the patient that in order to achieve an erection it takes good functioning of the nervous system (parasympathetic and rs, sympathetic, sensory and motor), good blood flow into the erectile tissue of the penis and a desire to have sex - I explained that conditions like diabetes, hypertension, coronary artery disease, peripheral vascular disease, smoking, alcohol consumption, age, sleep apnea and BPH can diminish the ability to have an erection - I explained the ED may be a risk marker for underlying CVD and he should follow up with PCP for further studies *** - we will obtain a serum testosterone level at this time; if it is abnormal we will need to repeat the study for confirmation *** - A recent study published in Sex Med 2018 Apr 13 revealed moderate to vigorous aerobic exercise for 40 minutes 4 times per week can decrease erectile problems caused by physical inactivity, obesity, hypertension, metabolic syndrome and/or cardiovascular diseases *** - We discussed trying a *** different PDE5 inhibitor, intra-urethral suppositories,  intracavernous vasoactive drug injection therapy, vacuum erection devices, LI-ESWT and penile prosthesis implantation   No follow-ups on file.  These notes generated with voice recognition software. I apologize for typographical errors.  Cloretta Ned  [facility] [address]  [address] [place], [name] [address] [phone]

## 2023-04-13 ENCOUNTER — Ambulatory Visit: Payer: BC Managed Care – PPO | Admitting: Urology

## 2023-04-13 DIAGNOSIS — N529 Male erectile dysfunction, unspecified: Secondary | ICD-10-CM

## 2023-04-14 ENCOUNTER — Ambulatory Visit (INDEPENDENT_AMBULATORY_CARE_PROVIDER_SITE_OTHER): Payer: BLUE CROSS/BLUE SHIELD | Admitting: Vascular Surgery

## 2023-04-16 ENCOUNTER — Encounter: Payer: Self-pay | Admitting: Family Medicine

## 2023-04-16 NOTE — Assessment & Plan Note (Signed)
Imaging appears stable over several years in our system and when compared to outside imaging through care everywhere Asx, previously worked up

## 2023-04-16 NOTE — Assessment & Plan Note (Signed)
    04/07/2023    9:40 AM 02/03/2023   10:16 AM 12/10/2022   10:03 AM  Depression screen PHQ 2/9  Decreased Interest 0 0 0  Down, Depressed, Hopeless 0 0 0  PHQ - 2 Score 0 0 0  Altered sleeping 0 0 0  Tired, decreased energy 0 0 0  Change in appetite 0 0 0  Feeling bad or failure about yourself  0 0 0  Trouble concentrating 0 0 0  Moving slowly or fidgety/restless 0 0 0  Suicidal thoughts 0 0 0  PHQ-9 Score 0 0 0  Difficult doing work/chores Not difficult at all Not difficult at all Not difficult at all  Phq 9 reviewed and negative - last Sept he was on zoloft 25 mg, not currently taking or on list

## 2023-04-16 NOTE — Assessment & Plan Note (Signed)
Lab Results  Component Value Date   HGBA1C 5.5 09/09/2022  Last A1C normal, will continue to monitor lab glucose readings

## 2023-04-16 NOTE — Assessment & Plan Note (Signed)
Bp well controlled today BP Readings from Last 3 Encounters:  04/07/23 130/72  04/03/23 (!) 155/90  03/25/23 129/76  Continue on norvasc and losartan 100 mg daily

## 2023-04-16 NOTE — Assessment & Plan Note (Signed)
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) WRITTEN ON 04/16/2023  5:16 PM BY Khori Underberg, PA-C     04/07/2023    9:40 AM 02/03/2023   10:16 AM 12/10/2022   10:03 AM  Depression screen PHQ 2/9  Decreased Interest 0 0 0  Down, Depressed, Hopeless 0 0 0  PHQ - 2 Score 0 0 0  Altered sleeping 0 0 0  Tired, decreased energy 0 0 0  Change in appetite 0 0 0  Feeling bad or failure about yourself  0 0 0  Trouble concentrating 0 0 0  Moving slowly or fidgety/restless 0 0 0  Suicidal thoughts 0 0 0  PHQ-9 Score 0 0 0  Difficult doing work/chores Not difficult at all Not difficult at all Not difficult at all  Phq 9 reviewed and negative - last Sept he was on zoloft 25 mg, not currently taking or on list

## 2023-04-16 NOTE — Assessment & Plan Note (Signed)
Per hem on eliquis long term

## 2023-04-22 NOTE — Telephone Encounter (Signed)
error 

## 2023-04-23 ENCOUNTER — Ambulatory Visit: Payer: Self-pay
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
## 2023-04-24 ENCOUNTER — Ambulatory Visit (INDEPENDENT_AMBULATORY_CARE_PROVIDER_SITE_OTHER): Payer: BLUE CROSS/BLUE SHIELD | Admitting: Vascular Surgery

## 2023-04-24 ENCOUNTER — Encounter: Payer: Self-pay | Admitting: Family Medicine

## 2023-04-24 ENCOUNTER — Ambulatory Visit: Payer: BC Managed Care – PPO | Admitting: Family Medicine

## 2023-04-24 VITALS — BP 132/84 | HR 97 | Temp 97.6°F | Resp 16 | Ht 74.0 in | Wt 237.5 lb

## 2023-04-24 DIAGNOSIS — F332 Major depressive disorder, recurrent severe without psychotic features: Secondary | ICD-10-CM | POA: Diagnosis not present

## 2023-04-24 DIAGNOSIS — F419 Anxiety disorder, unspecified: Secondary | ICD-10-CM | POA: Diagnosis not present

## 2023-04-24 MED ORDER — SERTRALINE HCL 50 MG PO TABS
50.0000 mg | ORAL_TABLET | Freq: Every day | ORAL | 1 refills | Status: DC
Start: 2023-04-24 — End: 2023-06-23

## 2023-04-24 MED ORDER — LORAZEPAM 0.5 MG PO TABS
0.5000 mg | ORAL_TABLET | Freq: Two times a day (BID) | ORAL | 0 refills | Status: DC | PRN
Start: 2023-04-24 — End: 2023-11-13

## 2023-04-24 NOTE — Progress Notes (Signed)
Patient ID: Jonathon Snow, male    DOB: October 30, 1961, 62 y.o.   MRN: [id number]  PCP: Danelle Berry, PA-C  Chief Complaint  Patient presents with   Depression    Subjective:   Jonathon Snow is a 62 y.o. male, presents to clinic with CC of the following:  HPI  Pt here for severe worsening depression and anxiety sx He Lives alone One daughter in [place] no other family in the area Has been out of work since June 2022 - back surgery  Focus forgetfullness, inability to get stuff done, spends more time trying to make himself start to get stuff done than getting stuff done, he feels stress, guilt, same, passive SI no plans Forgets his meds sometimes, doesn't know if he take them and then is afraid to take them double  Increase   Very poor sleep, can't fall asleep and then up and down all night exhausted in the morning Social anxiety worse lastey  Pain meds from broken him and surgery he has pain meds prescribed - has maybe taken one or two pain pills in the last week  No ETOH   PHQ 9 and GAD 7 positive and reviewed today, denies SI/HI/AVH    04/24/2023    8:15 AM 04/07/2023    9:40 AM 02/03/2023   10:16 AM  Depression screen PHQ 2/9  Decreased Interest 3 0 0  Down, Depressed, Hopeless 3 0 0  PHQ - 2 Score 6 0 0  Altered sleeping 3 0 0  Tired, decreased energy 3 0 0  Change in appetite 3 0 0  Feeling bad or failure about yourself  3 0 0  Trouble concentrating 3 0 0  Moving slowly or fidgety/restless 0 0 0  Suicidal thoughts 1 0 0  PHQ-9 Score 22 0 0  Difficult doing work/chores Extremely dIfficult Not difficult at all Not difficult at all      04/24/2023    8:16 AM 10/02/2021    8:17 AM 09/18/2021    1:48 PM 08/08/2021    8:51 AM  GAD 7 : Generalized Anxiety Score  Nervous, Anxious, on Edge 3 2 3  0  Control/stop worrying 3 2 2  0  Worry too much - different things 3 2 2  0  Trouble relaxing 3 2 3  0  Restless 3 2 3  0  Easily annoyed or irritable 3 0 0 0  Afraid -  awful might happen 3 2 2  0  Total GAD 7 Score 21 12 15  0  Anxiety Difficulty Extremely difficult Somewhat difficult Very difficult Not difficult at all    Nurse call and notes last night and this am: Chief Complaint: Anxiety/depression Symptoms: nausea, heart racing Frequency: last 2 weeks worse, but ongoing for months Pertinent Negatives: Patient denies chest pain Disposition: [x] ED /[] Urgent Care (no appt availability in office) / [] Appointment(In office/virtual)/ []  [facility]/ [] Home Care/ [x] Refused Recommended Disposition /[] [facility]/ []  Follow-up with PCP Additional Notes: Patient called saying he's having issues with depression. He says he doesn't want to go out the house because he can't be around people, postponing doctor's appointments, crying watching sports shows on tv, crying watching action shows on tv, crying listening to the radio (nothing is sad about any of these activities, but he says he just starts crying for no reason). He says he doesn't let anyone come in the house, they knock and he just sits there waiting on them to leave, he doesn't open the door.  He says that this has been ongoing for months, but the last 2 weeks have been the most difficult. He says he tries to call family/friends to get his mind off of what's going on, but as soon as they say hello he's hurrying to get off the phone and doesn't really carry on a conversation. He says his house is a "wreck" because he just can't get up the energy/desire to clean it and he wasn't raised like this. He says he's not sleeping good at night, not slept past 2 nights, spends days trying to sleep but only able to rest for about 1 hour then he's up. He is out of work due to hip fracture. He says he was having PT come to the house, but they started him going to the office for PT and he says he's anxious while he's there, but able to function and when it's over he's rushing to get out and get home as quickly  as he can. He says anxiety severity is a 10/10, exhausted to the point of having headaches due to no sleep. When I asked about suicide, wanting to harm or kill himself, he said "I can't say I have thoughts of killing myself, no plans of killing myself with a gun or anything. But if I could go to sleep and not wake up, that would be ok with me." Physical symptoms present now are heart racing and nausea. I advised ED for evaluation. He says he doesn't want to go there because all he will do is just sit and they will do nothing for him. I advised [facility] phone numbers to call, he took the numbers down, I advised suicide hotline number. He says that's not what I need, I need my doctor to help me so that I can stop feeling like this. Patient was tearful during the triage call. I advised even though he doesn't have thoughts of harming/killing himself, because he wants to sleep and not wake up, that is wanting to kill yourself. I placed him on hold and called the office, spoke to [place], [employer]. She says he will need to come in at 0820 in the morning to see Sitlali Koerner. Patient advised, he breathing heavily and sighing with no words. I asked him to hold, connected him successfully to [facility] to speak to him about the appointment.     Reason for Disposition  [1] SEVERE anxiety (e.g., extremely anxious with intense emotional symptoms such as feeling of unreality, urge to flee, unable to calm down; unable to cope or function) AND [2] not better after 10 minutes of reassurance and Care Advice  Answer Assessment - Initial Assessment Questions 1. CONCERN: "Did anything happen that prompted you to call today?"      Talking to stepson about what is going on with me and he has mental health issues, I trust to talk to him 2. ANXIETY SYMPTOMS: "Can you describe how you (your loved one; patient) have been feeling?" (e.g., tense, restless, panicky, anxious, keyed up, overwhelmed, sense of impending doom).      Unable to go out the  house, cry watching tv, cry listening to the radio, depressed, don't want to be around anyone, not sleeping x 2 days, postpone doctor's appointments because don't want to be around anyone 3. ONSET: "How long have you been feeling this way?" (e.g., hours, days, weeks)     Months, some good days, especially last 2 weeks most difficult 4. SEVERITY: "How would you rate the level of  anxiety?" (e.g., 0 - 10; or mild, moderate, severe).     10 5. FUNCTIONAL IMPAIRMENT: "How have these feelings affected your ability to do daily activities?" "Have you had more difficulty than usual doing your normal daily activities?" (e.g., getting better, same, worse; self-care, school, work, interactions)     Jonathon Snow is a wreck, spend the day trying to sleep, exhausted to the point of having a headache due to no sleep 6. HISTORY: "Have you felt this way before?" "Have you ever been diagnosed with an anxiety problem in the past?" (e.g., generalized anxiety disorder, panic attacks, PTSD). If Yes, ask: "How was this problem treated?" (e.g., medicines, counseling, etc.)     Yes, not as extreme as it is now. She prescribed medication that didn't work, so stopped the medication. Felt like could function 7. RISK OF HARM - SUICIDAL IDEATION: "Do you ever have thoughts of hurting or killing yourself?" If Yes, ask:  "Do you have these feelings now?" "Do you have a plan on how you would do this?"     Feeling like don't want to do on, can't say thoughts of killing self, no plans or thoughts of killing self. I would like to fall asleep and not wake up 8. TREATMENT:  "What has been done so far to treat this anxiety?" (e.g., medicines, relaxation strategies). "What has helped?"     Find things to distract me, reach out to people to talk about other things (unable to carry on a conversation), try to entertain self with watching television or listening to music then will start crying 9. TREATMENT - THERAPIST: "Do you have a counselor or  therapist? Name?"     No 10. POTENTIAL TRIGGERS: "Do you drink caffeinated beverages (e.g., coffee, colas, teas), and how much daily?" "Do you drink alcohol or use any drugs?" "Have you started any new medicines recently?"       No alcohol/drugs. Drink more soda than should, but not like used to due to kidney disease 11. PATIENT SUPPORT: "Who is with you now?" "Who do you live with?" "Do you have family or friends who you can talk to?"        Live alone. Haven't said anything to the family (raised not to speak of depression) 12. OTHER SYMPTOMS: "Do you have any other symptoms?" (e.g., feeling depressed, trouble concentrating, trouble sleeping, trouble breathing, palpitations or fast heartbeat, chest pain, sweating, nausea, or diarrhea)       Feels like heart is faster half the time, nausea  Protocols used: Anxiety and Panic Attack-A-AH  Jonathon Snow, [facility], Miel R 20 hours ago [place], Lisa W, RN to [employer] 19 hours ago Lorain Childes to [place], see triage notes, refused ED  Mock, Orlinda Blalock R 4 hours ago MM Note I spoke with patient and informed him that since he didn't want to go to ED he would need an in person appointment.  Patient allowed me to make him an appointment with his pcp Danelle Berry, PA for 04/24/23 @ 8:20 AM.  Patient stated that he would try to come in office at that time.  Patient stated that he wasn't truthful as far as his depression on his last visit.  Patient stated he does have the number to the mobile crisis unit and will use that if he has to because doesn't want to go to ER.  Previously on zoloft 25, stopped taking last year cause he didn't think it was helping him Never discussed sx previously to adjust meds, never  had therapist or psych help in the past Nothing happened over the past couple months or weeks it just seems all the sudden out of control   Patient Active Problem List   Diagnosis Date Noted   SLE (systemic lupus erythematosus related syndrome) (HCC)  02/19/2023   Closed left hip fracture, initial encounter (HCC) 02/18/2023   Pain and swelling of right lower extremity 02/03/2023   Erectile disorder 01/22/2023   Coronary artery disease of native artery of native heart with stable angina pectoris (HCC) 09/09/2022   Current moderate episode of major depressive disorder (HCC) 09/09/2022   Varicose 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 01/03/2022   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) 09/18/2021   Prediabetes 11/08/2020   Stage 3a chronic kidney disease (HCC) 11/08/2020   Seasonal allergies 11/08/2020   Rhinosinusitis 11/08/2020   Long term current use of systemic steroids 11/08/2020   Anticoagulant disorder (HCC) 11/07/2020   Senile purpura (HCC) 11/07/2020   History of DVT (deep vein thrombosis) 03/22/2020   MDD (major depressive disorder), recurrent episode, mild (HCC) 09/12/2019   Other spondylosis with radiculopathy, lumbar region 02/16/2019   Osteoporosis 10/12/2018   Chronic venous insufficiency 10/07/2018   Lymphedema 10/07/2018   SLE glomerulonephritis syndrome, 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HCC) 03/03/2018   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) 10/29/2016   Mixed hyperlipidemia 01/15/2016   Primary hypertension 01/15/2016   Obesity (BMI 30.0-34.9) 01/15/2016   Long term current use of anticoagulant 10/04/2015   Systemic lupus erythematosus (HCC) 05/30/2015   COPD, moderate (HCC) 06/27/2014   History of pulmonary embolism 12/11/2013   Nodule of right lung 12/11/2013   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   Cystic disease of liver 08/21/2013      Current Outpatient Medications:    albuterol (VENTOLIN HFA) 108 (90 Base) MCG/ACT inhaler, Inhale 2 puffs into the lungs every 6 (six) hours as needed for wheezing or shortness of breath., Disp: 18 g, Rfl: 2   amLODipine (NORVASC) 5 MG tablet, Take 1 tablet (5 mg total) by mouth daily., Disp: 90 tablet, Rfl:  1   apixaban (ELIQUIS) 5 MG TABS tablet, Take 1 tablet (5 mg total) by mouth 2 (two) times daily., Disp: 60 tablet, Rfl: 6   cholecalciferol (VITAMIN D3) 25 MCG (1000 UNIT) tablet, Take 1,000 Units by mouth daily., Disp: , Rfl:    gabapentin (NEURONTIN) 300 MG capsule, Take 1 capsule by mouth 3 (three) times daily., Disp: , Rfl:    levocetirizine (XYZAL) 5 MG tablet, Take 1 tablet (5 mg total) by mouth every evening., Disp: 30 tablet, Rfl: 5   losartan (COZAAR) 100 MG tablet, Take 1 tablet (100 mg total) by mouth daily., Disp: 90 tablet, Rfl: 1   montelukast (SINGULAIR) 10 MG tablet, Take 1 tablet (10 mg total) by mouth at bedtime., Disp: 90 tablet, Rfl: 3   Multiple Vitamins-Minerals (MULTIVITAMIN WITH MINERALS) tablet, Take 1 tablet by mouth daily., Disp: , Rfl:    mycophenolate (CELLCEPT) 500 MG tablet, Take 1,000 mg by mouth 2 (two) times daily., Disp: , Rfl:    Olopatadine-Mometasone (RYALTRIS) 665-25 MCG/ACT SUSP, Place 2 sprays into the nose 2 (two) times daily as needed., Disp: 29 g, Rfl: 2   ondansetron (ZOFRAN) 4 MG tablet, Take 1 tablet (4 mg total) by mouth every 6 (six) hours as needed for nausea., Disp: 20 tablet, Rfl: 0   oxyCODONE (OXY IR/ROXICODONE) 5 MG immediate release tablet, Take 0.5-1 tablets (2.5-5  mg total) by mouth every 4 (four) hours as needed for moderate pain (pain score 4-6)., Disp: 30 tablet, Rfl: 0   predniSONE (DELTASONE) 10 MG tablet, Take 1 tablet by mouth daily., Disp: , Rfl:    promethazine-dextromethorphan (PROMETHAZINE-DM) 6.25-15 MG/5ML syrup, Take 5 mLs by mouth 4 (four) times daily as needed for cough., Disp: 118 mL, Rfl: 0   rosuvastatin (CRESTOR) 10 MG tablet, Take 1 tablet (10 mg total) by mouth daily., Disp: 90 tablet, Rfl: 3   sildenafil (REVATIO) 20 MG tablet, TAKE 3 TO 5 TABLETS BY MOUTH AS NEEDED 30 MIN PRIOR TO INTERCOURSE, Disp: 30 tablet, Rfl: 5   traMADol (ULTRAM) 50 MG tablet, Take 1 tablet (50 mg total) by mouth every 6 (six) hours as needed  for moderate pain., Disp: 30 tablet, Rfl: 0   meclizine (ANTIVERT) 25 MG tablet, Take 0.5-1 tablets (12.5-25 mg total) by mouth 3 (three) times daily as needed for dizziness. (Patient not taking: Reported on 04/07/2023), Disp: 30 tablet, Rfl: 1   Allergies  Allergen Reactions   Enalapril Other (See Comments)    Unknown reaction   Vicodin [Hydrocodone-Acetaminophen] Hives and Rash    Severe headaches (also) NAME BRAND ONLY PER PT CAN TAKE GENERIC     Social History   Tobacco Use   Smoking status: Never   Smokeless tobacco: Never  Vaping Use   Vaping Use: Never used  Substance Use Topics   Alcohol use: No   Drug use: No    Comment: PT DENIES      Chart Review Today: I personally reviewed active problem list, medication list, allergies, family history, social history, health maintenance, notes from last encounter, lab results, imaging with the patient/caregiver today.   Review of Systems  Constitutional: Negative.   HENT: Negative.    Eyes: Negative.   Respiratory: Negative.    Cardiovascular: Negative.   Gastrointestinal: Negative.   Endocrine: Negative.   Genitourinary: Negative.   Musculoskeletal: Negative.   Skin: Negative.   Allergic/Immunologic: Negative.   Neurological: Negative.   Hematological: Negative.   Psychiatric/Behavioral: Negative.    All other systems reviewed and are negative.      Objective:   Vitals:   04/24/23 0817  BP: 132/84  Pulse: 97  Resp: 16  Temp: 97.6 F (36.4 C)  TempSrc: Oral  SpO2: 94%  Weight: 237 lb 8 oz (107.7 kg)  Height: 6\' 2"  (1.88 m)    Body mass index is 30.49 kg/m.  Physical Exam Vitals and nursing note reviewed.  Constitutional:      General: He is not in acute distress.    Appearance: Normal appearance. He is well-developed. He is obese. He is not ill-appearing, toxic-appearing or diaphoretic.  HENT:     Head: Normocephalic and atraumatic.     Nose: Nose normal.  Eyes:     General:        Right eye:  No discharge.        Left eye: No discharge.     Conjunctiva/sclera: Conjunctivae normal.  Neck:     Trachea: No tracheal deviation.  Cardiovascular:     Rate and Rhythm: Normal rate and regular rhythm.  Pulmonary:     Effort: Pulmonary effort is normal. No respiratory distress.     Breath sounds: No stridor.  Musculoskeletal:        General: Normal range of motion.  Skin:    General: Skin is warm and dry.     Findings: No rash.  Neurological:  Mental Status: He is alert.     Motor: Tremor (bilteral UE) present. No abnormal muscle tone.     Coordination: Coordination normal.  Psychiatric:        Mood and Affect: Mood is anxious and depressed. Affect is tearful.        Speech: Speech normal.        Behavior: Behavior normal. Behavior is cooperative.        Thought Content: Thought content normal. Thought content is not paranoid or delusional. Thought content does not include homicidal or suicidal ideation. Thought content does not include homicidal or suicidal plan.        Cognition and Memory: Cognition is not impaired. Memory is not impaired.     Comments: Poor eye contact, crying       Results for orders placed or performed in visit on 04/03/23  Comprehensive metabolic panel  Result Value Ref Range   Sodium 135 135 - 145 mmol/L   Potassium 4.4 3.5 - 5.1 mmol/L   Chloride 103 98 - 111 mmol/L   CO2 23 22 - 32 mmol/L   Glucose, Bld 109 (H) 70 - 99 mg/dL   BUN 30 (H) 8 - 23 mg/dL   Creatinine, Ser 1.61 (H) 0.61 - 1.24 mg/dL   Calcium 8.6 (L) 8.9 - 10.3 mg/dL   Total Protein 7.6 6.5 - 8.1 g/dL   Albumin 4.0 3.5 - 5.0 g/dL   AST 17 15 - 41 U/L   ALT 17 0 - 44 U/L   Alkaline Phosphatase 115 38 - 126 U/L   Total Bilirubin 0.8 0.3 - 1.2 mg/dL   GFR, Estimated 52 (L) >60 mL/min   Anion gap 9 5 - 15  CBC with Differential/Platelet  Result Value Ref Range   WBC 5.8 4.0 - 10.5 K/uL   RBC 5.29 4.22 - 5.81 MIL/uL   Hemoglobin 15.5 13.0 - 17.0 g/dL   HCT 09.6 04.5 - 40.9 %    MCV 90.2 80.0 - 100.0 fL   MCH 29.3 26.0 - 34.0 pg   MCHC 32.5 30.0 - 36.0 g/dL   RDW 81.1 91.4 - 78.2 %   Platelets 193 150 - 400 K/uL   nRBC 0.0 0.0 - 0.2 %   Neutrophils Relative % 83 %   Neutro Abs 4.8 1.7 - 7.7 K/uL   Lymphocytes Relative 9 %   Lymphs Abs 0.5 (L) 0.7 - 4.0 K/uL   Monocytes Relative 8 %   Monocytes Absolute 0.5 0.1 - 1.0 K/uL   Eosinophils Relative 0 %   Eosinophils Absolute 0.0 0.0 - 0.5 K/uL   Basophils Relative 0 %   Basophils Absolute 0.0 0.0 - 0.1 K/uL   Immature Granulocytes 0 %   Abs Immature Granulocytes 0.01 0.00 - 0.07 K/uL       Assessment & Plan:     ICD-10-CM   1. Severe episode of recurrent major depressive disorder, without psychotic features (HCC)  F33.2 Ambulatory referral to Psychiatry    sertraline (ZOLOFT) 50 MG tablet   phq 9 very positive, pt having sx for some time but severe over the past months and weeks, restart zoloft, 25 mg x 7 d then 50 mg close f/up in office, psych    2. Anxiety  F41.9 Ambulatory referral to Psychiatry    sertraline (ZOLOFT) 50 MG tablet    LORazepam (ATIVAN) 0.5 MG tablet   likely some social anxiety and generalized, having sx consistent with panic attacks - I explained that higher  doses of zololft may help with psych tx     Pt given number or [facility] to call to f/up on referral, given mobile crisis number He is not currently taking pain meds -but did recently get Rx filled last month - advised to not take benzo with pain meds due to risk of unintensional OD, he verbalized understanding Pt will try to reach out to his daughter or friend for help with coordinating his care or helping him f/up if he is not able to do it himself. I explained that he should try to get some of his worrisome thoughts out, go for walks, work on finding some coping skills and getting out of his home may help.  He did express a lot of panic and social anxiety - to that may limit his ability to do that - encouraged him to get up and  dressed and go out somewhere w/o crowds - walk around his [place] or a park Also explained zoloft/SSRIs take 4-6 weeks to start being effective and full 12 weeks sometimes to see full effect.  For his sx, a dose of zoloft much higher may be appropriate 100-225.  For short term use antianxiety meds. Close f/up in office if he's unable to get into psychiatry Urgent referral placed  Pt was very emotional but very clear headed with cognition, memory, he adamantly denied any suicidal ideations or plan, no HI, AVH.   Pt can go to the ER, call hotline, mobile crisis unit or go over to [facility] urgent care if he needs to this weekend, but I did not feel like he had to go to the ER right now.  He understands he can go to the ER to seek psychiatric help if his symptoms worsen at any time  More than 45 min were spent with the pt today in the exam room, supportive listening, education, discussion of meds/options/follow up plans reviewed.  Resources gathered for him and given on AVS and printed hand outs.  Danelle Berry, PA-C 04/24/23 8:41 AM

## 2023-04-24 NOTE — Telephone Encounter (Signed)
I spoke with patient and informed him that since he didn't want to go to ED he would need an in person appointment.  Patient allowed me to make him an appointment with his pcp Danelle Berry, PA for 04/24/23 @ 8:20 AM.  Patient stated that he would try to come in office at that time.  Patient stated that he wasn't truthful as far as his depression on his last visit.  Patient stated he does have the number to the mobile crisis unit and will use that if he has to because doesn't want to go to ER.

## 2023-04-24 NOTE — Patient Instructions (Addendum)
Team Member Role and Specialty Contact Info Address Start End Comments  Neysa Hotter, MD Consulting Physician (Psychiatry) Phone: [phone] Fax: [phone] [address] [name] [address] 04/24/2023 - -  [facility]  Call to see if you can get an appt    Here are some resources to help you if you feel you are in a mental health crisis:  National Suicide Prevention Lifeline - Call 705-451-3057  for help - Website with more resources: ARanked.fi  [facility] [facility] - Address: [address], [place] - Telephone: [phone]  - Hours of Operation: Sunday - Saturday - 8:00 a.m. - 8:00 p.m. - Medicaid, Medicare (Government Issued Only), BCBS, and [employer] - Pay - Crisis Management, Outpatient Individual & Group Therapy, Psychiatrists on-site to provide medication management, In-Home Psychiatric Care, and Peer Support Care.  National Mobile Crisis: [phone] - Mobile Crisis Program available 24 hours a day, 365 days a year. - Available for anyone of any age in [place] & [facility]

## 2023-04-30 ENCOUNTER — Ambulatory Visit: Payer: BC Managed Care – PPO | Admitting: Family Medicine

## 2023-04-30 NOTE — Progress Notes (Deleted)
Patient ID: Jonathon Snow, male    DOB: Jun 13, 1961, 62 y.o.   MRN: [id number]  PCP: Danelle Berry, PA-C  No chief complaint on file.   Subjective:   Jonathon Snow is a 62 y.o. male, presents to clinic with CC of the following:  HPI  Returns for close f/up on severe MDD/tearfulness, anxiety GAD/social anxiety? Restarted meds, SSRI zoloft with some rescue medications We discussed psych resources and did referral however [facility] is unable to take new pt at this time.  Today pt reports ***    04/24/2023    8:15 AM 04/07/2023    9:40 AM 02/03/2023   10:16 AM  Depression screen PHQ 2/9  Decreased Interest 3 0 0  Down, Depressed, Hopeless 3 0 0  PHQ - 2 Score 6 0 0  Altered sleeping 3 0 0  Tired, decreased energy 3 0 0  Change in appetite 3 0 0  Feeling bad or failure about yourself  3 0 0  Trouble concentrating 3 0 0  Moving slowly or fidgety/restless 0 0 0  Suicidal thoughts 1 0 0  PHQ-9 Score 22 0 0  Difficult doing work/chores Extremely dIfficult Not difficult at all Not difficult at all      04/24/2023    8:16 AM 10/02/2021    8:17 AM 09/18/2021    1:48 PM 08/08/2021    8:51 AM  GAD 7 : Generalized Anxiety Score  Nervous, Anxious, on Edge 3 2 3  0  Control/stop worrying 3 2 2  0  Worry too much - different things 3 2 2  0  Trouble relaxing 3 2 3  0  Restless 3 2 3  0  Easily annoyed or irritable 3 0 0 0  Afraid - awful might happen 3 2 2  0  Total GAD 7 Score 21 12 15  0  Anxiety Difficulty Extremely difficult Somewhat difficult Very difficult Not difficult at all      Patient Active Problem List   Diagnosis Date Noted   SLE (systemic lupus erythematosus related syndrome) (HCC) 02/19/2023   Closed left hip fracture, initial encounter (HCC) 02/18/2023   Pain and swelling of right lower extremity 02/03/2023   Erectile disorder 01/22/2023   Coronary artery disease of native artery of native heart with stable angina pectoris (HCC) 09/09/2022   Current moderate episode of  major depressive disorder (HCC) 09/09/2022   Varicose 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 01/03/2022   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) 09/18/2021   Prediabetes 11/08/2020   Stage 3a chronic kidney disease (HCC) 11/08/2020   Seasonal allergies 11/08/2020   Rhinosinusitis 11/08/2020   Long term current use of systemic steroids 11/08/2020   Anticoagulant disorder (HCC) 11/07/2020   Senile purpura (HCC) 11/07/2020   History of DVT (deep vein thrombosis) 03/22/2020   MDD (major depressive disorder), recurrent episode, mild (HCC) 09/12/2019   Other spondylosis with radiculopathy, lumbar region 02/16/2019   Osteoporosis 10/12/2018   Chronic venous insufficiency 10/07/2018   Lymphedema 10/07/2018   SLE glomerulonephritis syndrome, 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HCC) 03/03/2018   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) 10/29/2016   Mixed hyperlipidemia 01/15/2016   Primary hypertension 01/15/2016   Obesity (BMI 30.0-34.9) 01/15/2016   Long term current use of anticoagulant 10/04/2015   Systemic lupus erythematosus (HCC) 05/30/2015   COPD, moderate (HCC) 06/27/2014   History of pulmonary embolism 12/11/2013   Nodule of right lung 12/11/2013   Cerebral venous sinus thrombosis  08/21/2013   Cystic disease of liver 08/21/2013      Current Outpatient Medications:    albuterol (VENTOLIN HFA) 108 (90 Base) MCG/ACT inhaler, Inhale 2 puffs into the lungs every 6 (six) hours as needed for wheezing or shortness of breath., Disp: 18 g, Rfl: 2   amLODipine (NORVASC) 5 MG tablet, Take 1 tablet (5 mg total) by mouth daily., Disp: 90 tablet, Rfl: 1   apixaban (ELIQUIS) 5 MG TABS tablet, Take 1 tablet (5 mg total) by mouth 2 (two) times daily., Disp: 60 tablet, Rfl: 6   cholecalciferol (VITAMIN D3) 25 MCG (1000 UNIT) tablet, Take 1,000 Units by mouth daily., Disp: , Rfl:    gabapentin (NEURONTIN) 300 MG capsule, Take 1 capsule by mouth 3  (three) times daily., Disp: , Rfl:    levocetirizine (XYZAL) 5 MG tablet, Take 1 tablet (5 mg total) by mouth every evening., Disp: 30 tablet, Rfl: 5   LORazepam (ATIVAN) 0.5 MG tablet, Take 1 tablet (0.5 mg total) by mouth 2 (two) times daily as needed for anxiety., Disp: 20 tablet, Rfl: 0   losartan (COZAAR) 100 MG tablet, Take 1 tablet (100 mg total) by mouth daily., Disp: 90 tablet, Rfl: 1   meclizine (ANTIVERT) 25 MG tablet, Take 0.5-1 tablets (12.5-25 mg total) by mouth 3 (three) times daily as needed for dizziness. (Patient not taking: Reported on 04/07/2023), Disp: 30 tablet, Rfl: 1   montelukast (SINGULAIR) 10 MG tablet, Take 1 tablet (10 mg total) by mouth at bedtime., Disp: 90 tablet, Rfl: 3   Multiple Vitamins-Minerals (MULTIVITAMIN WITH MINERALS) tablet, Take 1 tablet by mouth daily., Disp: , Rfl:    mycophenolate (CELLCEPT) 500 MG tablet, Take 1,000 mg by mouth 2 (two) times daily., Disp: , Rfl:    Olopatadine-Mometasone (RYALTRIS) 665-25 MCG/ACT SUSP, Place 2 sprays into the nose 2 (two) times daily as needed., Disp: 29 g, Rfl: 2   ondansetron (ZOFRAN) 4 MG tablet, Take 1 tablet (4 mg total) by mouth every 6 (six) hours as needed for nausea., Disp: 20 tablet, Rfl: 0   oxyCODONE (OXY IR/ROXICODONE) 5 MG immediate release tablet, Take 0.5-1 tablets (2.5-5 mg total) by mouth every 4 (four) hours as needed for moderate pain (pain score 4-6)., Disp: 30 tablet, Rfl: 0   predniSONE (DELTASONE) 10 MG tablet, Take 1 tablet by mouth daily., Disp: , Rfl:    promethazine-dextromethorphan (PROMETHAZINE-DM) 6.25-15 MG/5ML syrup, Take 5 mLs by mouth 4 (four) times daily as needed for cough., Disp: 118 mL, Rfl: 0   rosuvastatin (CRESTOR) 10 MG tablet, Take 1 tablet (10 mg total) by mouth daily., Disp: 90 tablet, Rfl: 3   sertraline (ZOLOFT) 50 MG tablet, Take 1 tablet (50 mg total) by mouth daily., Disp: 30 tablet, Rfl: 1   sildenafil (REVATIO) 20 MG tablet, TAKE 3 TO 5 TABLETS BY MOUTH AS NEEDED 30 MIN  PRIOR TO INTERCOURSE, Disp: 30 tablet, Rfl: 5   traMADol (ULTRAM) 50 MG tablet, Take 1 tablet (50 mg total) by mouth every 6 (six) hours as needed for moderate pain., Disp: 30 tablet, Rfl: 0   Allergies  Allergen Reactions   Enalapril Other (See Comments)    Unknown reaction   Vicodin [Hydrocodone-Acetaminophen] Hives and Rash    Severe headaches (also) NAME BRAND ONLY PER PT CAN TAKE GENERIC     Social History   Tobacco Use   Smoking status: Never   Smokeless tobacco: Never  Vaping Use   Vaping Use: Never used  Substance Use Topics  Alcohol use: No   Drug use: No    Comment: PT DENIES      Chart Review Today: ***  Review of Systems     Objective:   There were no vitals filed for this visit.  There is no height or weight on file to calculate BMI.  Physical Exam   Results for orders placed or performed in visit on 04/03/23  Comprehensive metabolic panel  Result Value Ref Range   Sodium 135 135 - 145 mmol/L   Potassium 4.4 3.5 - 5.1 mmol/L   Chloride 103 98 - 111 mmol/L   CO2 23 22 - 32 mmol/L   Glucose, Bld 109 (H) 70 - 99 mg/dL   BUN 30 (H) 8 - 23 mg/dL   Creatinine, Ser 1.61 (H) 0.61 - 1.24 mg/dL   Calcium 8.6 (L) 8.9 - 10.3 mg/dL   Total Protein 7.6 6.5 - 8.1 g/dL   Albumin 4.0 3.5 - 5.0 g/dL   AST 17 15 - 41 U/L   ALT 17 0 - 44 U/L   Alkaline Phosphatase 115 38 - 126 U/L   Total Bilirubin 0.8 0.3 - 1.2 mg/dL   GFR, Estimated 52 (L) >60 mL/min   Anion gap 9 5 - 15  CBC with Differential/Platelet  Result Value Ref Range   WBC 5.8 4.0 - 10.5 K/uL   RBC 5.29 4.22 - 5.81 MIL/uL   Hemoglobin 15.5 13.0 - 17.0 g/dL   HCT 09.6 04.5 - 40.9 %   MCV 90.2 80.0 - 100.0 fL   MCH 29.3 26.0 - 34.0 pg   MCHC 32.5 30.0 - 36.0 g/dL   RDW 81.1 91.4 - 78.2 %   Platelets 193 150 - 400 K/uL   nRBC 0.0 0.0 - 0.2 %   Neutrophils Relative % 83 %   Neutro Abs 4.8 1.7 - 7.7 K/uL   Lymphocytes Relative 9 %   Lymphs Abs 0.5 (L) 0.7 - 4.0 K/uL   Monocytes Relative 8 %    Monocytes Absolute 0.5 0.1 - 1.0 K/uL   Eosinophils Relative 0 %   Eosinophils Absolute 0.0 0.0 - 0.5 K/uL   Basophils Relative 0 %   Basophils Absolute 0.0 0.0 - 0.1 K/uL   Immature Granulocytes 0 %   Abs Immature Granulocytes 0.01 0.00 - 0.07 K/uL       Assessment & Plan:   ***     Danelle Berry, PA-C 04/30/23 5:24 PM

## 2023-05-01 ENCOUNTER — Ambulatory Visit: Payer: BC Managed Care – PPO | Admitting: Family Medicine

## 2023-05-01 DIAGNOSIS — F332 Major depressive disorder, recurrent severe without psychotic features: Secondary | ICD-10-CM

## 2023-05-01 DIAGNOSIS — F419 Anxiety disorder, unspecified: Secondary | ICD-10-CM

## 2023-05-01 DIAGNOSIS — Z5181 Encounter for therapeutic drug level monitoring: Secondary | ICD-10-CM

## 2023-05-04 ENCOUNTER — Encounter: Payer: Self-pay | Admitting: Family Medicine

## 2023-05-06 ENCOUNTER — Ambulatory Visit: Payer: BC Managed Care – PPO | Admitting: Urology

## 2023-05-07 ENCOUNTER — Telehealth: Payer: Self-pay | Admitting: Family Medicine

## 2023-05-07 DIAGNOSIS — F332 Major depressive disorder, recurrent severe without psychotic features: Secondary | ICD-10-CM

## 2023-05-07 DIAGNOSIS — F419 Anxiety disorder, unspecified: Secondary | ICD-10-CM

## 2023-05-07 NOTE — Telephone Encounter (Signed)
Referral has been placed. 

## 2023-05-07 NOTE — Telephone Encounter (Signed)
Copied from CRM [id number]. Topic: Referral - Request for Referral >> May 07, 2023  2:28 PM Marlow Baars wrote: Has patient seen PCP for this complaint? Yes.   Referral for which specialty: Psychiatry/Mental Health Preferred provider/office: [facility] Reason for referral: Mental Health  The original office was not accepting his insurance and this office does take his insurance so please send referral. He needs this as soon possible

## 2023-05-07 NOTE — Telephone Encounter (Signed)
Pt.notified

## 2023-05-12 NOTE — Telephone Encounter (Signed)
Copied from CRM [id number]. Topic: Referral - Status >> May 12, 2023  3:46 PM Macon Large wrote: Reason for CRM: Pt requests call back regarding the referral for psychiatry. Cb# [phone]

## 2023-05-12 NOTE — Telephone Encounter (Signed)
Spoke to patient, he wanted to try another referral placed to another location due to second location not reaching out to him . I gave information to patient from second referral that was placed. He stated he will call and see if they are accepting new patients and will let us know if any changes need to be done.

## 2023-05-12 NOTE — Telephone Encounter (Signed)
Pt was given this information: Referral has been sent to [facility]   P: [phone] F: [phone]   Release ID # [id number]

## 2023-05-12 NOTE — Telephone Encounter (Deleted)
Copied from CRM #[id number]. Topic: Referral - Status >> May 12, 2023  3:46 PM Ja-Kwan M wrote: Reason for CRM: Pt requests call back regarding the referral for psychiatry. Cb# [phone] 

## 2023-05-19 ENCOUNTER — Ambulatory Visit: Payer: BC Managed Care – PPO | Admitting: Urology

## 2023-05-21 ENCOUNTER — Ambulatory Visit: Payer: BC Managed Care – PPO | Admitting: Family Medicine

## 2023-05-22 ENCOUNTER — Telehealth: Payer: Self-pay

## 2023-05-22 ENCOUNTER — Encounter: Payer: Self-pay | Admitting: Gastroenterology

## 2023-05-22 NOTE — Telephone Encounter (Signed)
Received  message from Trish in Endo that patient needed to reschedule his colonoscopy. Currently scheduled 05/29/23 with Dr. Tobi Bastos.  Returned phone call to patient to reschedule.  He thinks he will need to r/s for the week of July7th, however he could not reach his daughter to confirm what her schedule was like for that week to reschedule.  He said he will call back on Monday after he talks to her.  Rosann Auerbach has been asked to keep him in the dep[place].  Thanks, [place], [place]

## 2023-05-25 ENCOUNTER — Telehealth: Payer: Self-pay | Admitting: *Deleted

## 2023-05-25 NOTE — Telephone Encounter (Signed)
Patient called office and request to reschedule on Wednesday 07/01/2023.   Called and spoken to Vicky to make the change.   New instructions will be sent. Patient have already pick up prep solution.   Patient verbalized understanding. 

## 2023-05-25 NOTE — Telephone Encounter (Signed)
Patient called office and request to reschedule on Wednesday 07/01/2023.   Called and spoken to [facility] to make the change.   New instructions will be sent. Patient have already pick up prep solution.   Patient verbalized understanding.

## 2023-05-25 NOTE — Telephone Encounter (Signed)
Eliquis   Dr. Donneta Romberg wants him to hold his Eliquis 3 days before his procedure and restart 1 day after his procedure.

## 2023-06-02 ENCOUNTER — Ambulatory Visit (INDEPENDENT_AMBULATORY_CARE_PROVIDER_SITE_OTHER): Payer: BLUE CROSS/BLUE SHIELD | Admitting: Vascular Surgery

## 2023-06-10 DIAGNOSIS — F332 Major depressive disorder, recurrent severe without psychotic features: Secondary | ICD-10-CM | POA: Diagnosis not present

## 2023-06-18 DIAGNOSIS — F332 Major depressive disorder, recurrent severe without psychotic features: Secondary | ICD-10-CM | POA: Diagnosis not present

## 2023-06-23 ENCOUNTER — Encounter: Payer: Self-pay | Admitting: Gastroenterology

## 2023-06-23 ENCOUNTER — Encounter: Payer: Self-pay | Admitting: Family Medicine

## 2023-06-23 ENCOUNTER — Telehealth (INDEPENDENT_AMBULATORY_CARE_PROVIDER_SITE_OTHER): Payer: Medicaid Other | Admitting: Family Medicine

## 2023-06-23 DIAGNOSIS — F419 Anxiety disorder, unspecified: Secondary | ICD-10-CM

## 2023-06-23 DIAGNOSIS — F332 Major depressive disorder, recurrent severe without psychotic features: Secondary | ICD-10-CM

## 2023-06-23 MED ORDER — SERTRALINE HCL 50 MG PO TABS
75.0000 mg | ORAL_TABLET | Freq: Every day | ORAL | 0 refills | Status: DC
Start: 2023-06-23 — End: 2023-07-22

## 2023-06-23 MED ORDER — SERTRALINE HCL 100 MG PO TABS
100.0000 mg | ORAL_TABLET | Freq: Every day | ORAL | 3 refills | Status: DC
Start: 2023-07-07 — End: 2023-11-13

## 2023-06-23 NOTE — Progress Notes (Addendum)
Name: Jonathon Snow   MRN: [id number]    DOB: 1961/03/24   Date:06/23/2023       Progress Note  Subjective:    I connected with  [name]  on 06/23/23 at  9:40 AM EDT by a video enabled telemedicine application and verified that I am speaking with the correct person using two identifiers.  I discussed the limitations of evaluation and management by telemedicine and the availability of in person appointments. The patient expressed understanding and agreed to proceed. Staff also discussed with the patient that there may be a patient responsible charge related to this service. Patient Location: home Provider Location: private office Additional Individuals present: none  Chief Complaint  Patient presents with   Follow-up   Depression    New med dose pt has not seen any difference    Jonathon Snow is a 62 y.o. male, presents for virtual visit for routine follow up on the conditions listed above.  Started zoloft again for depressed mood and did not due his short term f/up, he notes no improvement after restarting zoloft 2 months ago and increasing from 25 mg to 50 mg daily Phq 9 and GAD 7 reviewed and positive Pt did try to est with psych but had a bad experience with [facility] - prior referral to [facility] was denied due to unavailability     06/23/2023    9:15 AM 04/24/2023    8:15 AM 04/07/2023    9:40 AM  Depression screen PHQ 2/9  Decreased Interest 3 3 0  Down, Depressed, Hopeless 3 3 0  PHQ - 2 Score 6 6 0  Altered sleeping 2 3 0  Tired, decreased energy 3 3 0  Change in appetite 3 3 0  Feeling bad or failure about yourself  3 3 0  Trouble concentrating 3 3 0  Moving slowly or fidgety/restless 0 0 0  Suicidal thoughts 0 1 0  PHQ-9 Score 20 22 0  Difficult doing work/chores Very difficult Extremely dIfficult Not difficult at all      06/23/2023    9:17 AM 04/24/2023    8:16 AM 10/02/2021    8:17 AM 09/18/2021    1:48 PM  GAD 7 : Generalized Anxiety Score  Nervous, Anxious, on  Edge 3 3 2 3   Control/stop worrying 3 3 2 2   Worry too much - different things 3 3 2 2   Trouble relaxing 3 3 2 3   Restless 3 3 2 3   Easily annoyed or irritable 3 3 0 0  Afraid - awful might happen 3 3 2 2   Total GAD 7 Score 21 21 12 15   Anxiety Difficulty Very difficult Extremely difficult Somewhat difficult Very difficult        Patient Active Problem List   Diagnosis Date Noted   SLE (systemic lupus erythematosus related syndrome) (HCC) 02/19/2023   Closed left hip fracture, initial encounter (HCC) 02/18/2023   Pain and swelling of right lower extremity 02/03/2023   Erectile disorder 01/22/2023   Coronary artery disease of native artery of native heart with stable angina pectoris (HCC) 09/09/2022   Current moderate episode of major depressive disorder (HCC) 09/09/2022   Varicose 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 01/03/2022   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) 09/18/2021   Prediabetes 11/08/2020   Stage 3a chronic kidney disease (HCC) 11/08/2020   Seasonal allergies 11/08/2020   Rhinosinusitis 11/08/2020   Long term current use of systemic steroids 11/08/2020  Anticoagulant disorder (HCC) 11/07/2020   Senile purpura (HCC) 11/07/2020   History of DVT (deep vein thrombosis) 03/22/2020   MDD (major depressive disorder), recurrent episode, mild (HCC) 09/12/2019   Other spondylosis with radiculopathy, lumbar region 02/16/2019   Osteoporosis 10/12/2018   Chronic venous insufficiency 10/07/2018   Lymphedema 10/07/2018   SLE glomerulonephritis syndrome, 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HCC) 03/03/2018   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) 10/29/2016   Mixed hyperlipidemia 01/15/2016   Primary hypertension 01/15/2016   Obesity (BMI 30.0-34.9) 01/15/2016   Long term current use of anticoagulant 10/04/2015   Systemic lupus erythematosus (HCC) 05/30/2015   COPD, moderate (HCC) 06/27/2014   History of pulmonary  embolism 12/11/2013   Nodule of right lung 12/11/2013   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   Cystic disease of liver 08/21/2013    Current Outpatient Medications:    albuterol (VENTOLIN HFA) 108 (90 Base) MCG/ACT inhaler, Inhale 2 puffs into the lungs every 6 (six) hours as needed for wheezing or shortness of breath., Disp: 18 g, Rfl: 2   amLODipine (NORVASC) 5 MG tablet, Take 1 tablet (5 mg total) by mouth daily., Disp: 90 tablet, Rfl: 1   apixaban (ELIQUIS) 5 MG TABS tablet, Take 1 tablet (5 mg total) by mouth 2 (two) times daily., Disp: 60 tablet, Rfl: 6   cholecalciferol (VITAMIN D3) 25 MCG (1000 UNIT) tablet, Take 1,000 Units by mouth daily., Disp: , Rfl:    gabapentin (NEURONTIN) 300 MG capsule, Take 1 capsule by mouth 3 (three) times daily., Disp: , Rfl:    levocetirizine (XYZAL) 5 MG tablet, Take 1 tablet (5 mg total) by mouth every evening., Disp: 30 tablet, Rfl: 5   LORazepam (ATIVAN) 0.5 MG tablet, Take 1 tablet (0.5 mg total) by mouth 2 (two) times daily as needed for anxiety., Disp: 20 tablet, Rfl: 0   losartan (COZAAR) 100 MG tablet, Take 1 tablet (100 mg total) by mouth daily., Disp: 90 tablet, Rfl: 1   montelukast (SINGULAIR) 10 MG tablet, Take 1 tablet (10 mg total) by mouth at bedtime., Disp: 90 tablet, Rfl: 3   Multiple Vitamins-Minerals (MULTIVITAMIN WITH MINERALS) tablet, Take 1 tablet by mouth daily., Disp: , Rfl:    mycophenolate (CELLCEPT) 500 MG tablet, Take 1,000 mg by mouth 2 (two) times daily., Disp: , Rfl:    Olopatadine-Mometasone (RYALTRIS) 665-25 MCG/ACT SUSP, Place 2 sprays into the nose 2 (two) times daily as needed., Disp: 29 g, Rfl: 2   ondansetron (ZOFRAN) 4 MG tablet, Take 1 tablet (4 mg total) by mouth every 6 (six) hours as needed for nausea., Disp: 20 tablet, Rfl: 0   predniSONE (DELTASONE) 10 MG tablet, Take 1 tablet by mouth daily., Disp: , Rfl:    rosuvastatin (CRESTOR) 10 MG tablet, Take 1 tablet (10 mg total) by mouth daily., Disp: 90  tablet, Rfl: 3   sertraline (ZOLOFT) 50 MG tablet, Take 1 tablet (50 mg total) by mouth daily., Disp: 30 tablet, Rfl: 1   sildenafil (REVATIO) 20 MG tablet, TAKE 3 TO 5 TABLETS BY MOUTH AS NEEDED 30 MIN PRIOR TO INTERCOURSE, Disp: 30 tablet, Rfl: 5   traMADol (ULTRAM) 50 MG tablet, Take 1 tablet (50 mg total) by mouth every 6 (six) hours as needed for moderate pain., Disp: 30 tablet, Rfl: 0   meclizine (ANTIVERT) 25 MG tablet, Take 0.5-1 tablets (12.5-25 mg total) by mouth 3 (three) times daily as needed for dizziness. (Patient not taking: Reported on 04/07/2023),  Disp: 30 tablet, Rfl: 1   oxyCODONE (OXY IR/ROXICODONE) 5 MG immediate release tablet, Take 0.5-1 tablets (2.5-5 mg total) by mouth every 4 (four) hours as needed for moderate pain (pain score 4-6). (Patient not taking: Reported on 06/23/2023), Disp: 30 tablet, Rfl: 0   promethazine-dextromethorphan (PROMETHAZINE-DM) 6.25-15 MG/5ML syrup, Take 5 mLs by mouth 4 (four) times daily as needed for cough. (Patient not taking: Reported on 06/23/2023), Disp: 118 mL, Rfl: 0 Allergies  Allergen Reactions   Enalapril Other (See Comments)    Unknown reaction   Vicodin [Hydrocodone-Acetaminophen] Hives and Rash    Severe headaches (also) NAME BRAND ONLY PER PT CAN TAKE GENERIC    Past Surgical History:  Procedure Laterality Date   ANKLE SURGERY Right    BACK SURGERY     BRONCHIAL WASHINGS N/A 11/01/2021   Procedure: BRONCHIAL WASHINGS;  Surgeon: Vida Rigger, MD;  Location: [facility] ORS;  Service: Thoracic;  Laterality: N/A;   COLONOSCOPY WITH PROPOFOL N/A 05/28/2020   Procedure: COLONOSCOPY WITH PROPOFOL;  Surgeon: Wyline Mood, MD;  Location: [facility] ENDOSCOPY;  Service: Endoscopy;  Laterality: N/A;   CYST EXCISION  92 or 93    Liver cyst removal [facility]   FLEXIBLE BRONCHOSCOPY N/A 11/01/2021   Procedure: FLEXIBLE BRONCHOSCOPY;  Surgeon: Vida Rigger, MD;  Location: [facility] ORS;  Service: Thoracic;  Laterality: N/A;   HIP PINNING,CANNULATED Left  02/19/2023   Procedure: PERCUTANEOUS FIXATION OF FEMORAL NECK;  Surgeon: Signa Kell, MD;  Location: [facility] ORS;  Service: Orthopedics;  Laterality: Left;   I & D EXTREMITY Right 04/29/2017   Procedure: IRRIGATION AND DEBRIDEMENT EXTREMITY;  Surgeon: Ricarda Frame, MD;  Location: [facility] ORS;  Service: General;  Laterality: Right;   IRRIGATION AND DEBRIDEMENT ABSCESS Left 04/29/2017   Procedure: IRRIGATION AND DEBRIDEMENT Scrotal ABSCESS;  Surgeon: Ricarda Frame, MD;  Location: [facility] ORS;  Service: General;  Laterality: Left;   Family History  Problem Relation Age of Onset   Hypertension Father    Heart disease Father    Clotting disorder Mother    Kidney disease Brother    Heart attack Maternal Grandmother    Heart attack Maternal Grandfather    Heart attack Paternal Grandfather    Social History   Socioeconomic History   Marital status: Divorced    Spouse name: Not on file   Number of children: Not on file   Years of education: Not on file   Highest education level: Not on file  Occupational History   Not on file  Tobacco Use   Smoking status: Never   Smokeless tobacco: Never  Vaping Use   Vaping Use: Never used  Substance and Sexual Activity   Alcohol use: No   Drug use: No    Comment: PT DENIES   Sexual activity: Yes  Other Topics Concern   Not on file  Social History Narrative   Not on file   Social Determinants of Health   Financial Resource Strain: Not on file  Food Insecurity: No Food Insecurity (02/19/2023)   Hunger Vital Sign    Worried About Running Out of Food in the Last Year: Never true    Ran Out of Food in the Last Year: Never true  Transportation Needs: No Transportation Needs (02/19/2023)   PRAPARE - Administrator, Civil Service (Medical): No    Lack of Transportation (Non-Medical): No  Physical Activity: Not on file  Stress: Not on file  Social Connections: Not on file  Intimate Partner Violence: Not At Risk (  02/19/2023)    Humiliation, Afraid, Rape, and Kick questionnaire    Fear of Current or Ex-Partner: No    Emotionally Abused: No    Physically Abused: No    Sexually Abused: No    Chart Review Today: I personally reviewed active problem list, medication list, allergies, family history, social history, health maintenance, notes from last encounter, lab results, imaging with the patient/caregiver today.   Review of Systems  Constitutional: Negative.   HENT: Negative.    Eyes: Negative.   Respiratory: Negative.    Cardiovascular: Negative.   Gastrointestinal: Negative.   Endocrine: Negative.   Genitourinary: Negative.   Musculoskeletal: Negative.   Skin: Negative.   Allergic/Immunologic: Negative.   Neurological: Negative.   Hematological: Negative.   Psychiatric/Behavioral: Negative.    All other systems reviewed and are negative.     Objective:    Virtual encounter, vitals limited, only able to obtain the following There were no vitals filed for this visit. There is no height or weight on file to calculate BMI. Nursing Note and Vital Signs reviewed.  Physical Exam Vitals and nursing note reviewed.  Constitutional:      Appearance: He is obese.  Pulmonary:     Effort: Pulmonary effort is normal.  Neurological:     Mental Status: He is alert.  Psychiatric:        Attention and Perception: Attention normal.        Mood and Affect: Mood is depressed. Affect is tearful.        Speech: Speech normal.        Behavior: Behavior is cooperative.        Thought Content: Thought content does not include homicidal or suicidal ideation. Thought content does not include homicidal or suicidal plan.     PE limited by viirtual/video encounter  No results found for this or any previous visit (from the past 72 hour(s)).  PHQ2/9:    06/23/2023    9:15 AM 04/24/2023    8:15 AM 04/07/2023    9:40 AM 02/03/2023   10:16 AM 12/10/2022   10:03 AM  Depression screen PHQ 2/9  Decreased Interest 3 3 0  0 0  Down, Depressed, Hopeless 3 3 0 0 0  PHQ - 2 Score 6 6 0 0 0  Altered sleeping 2 3 0 0 0  Tired, decreased energy 3 3 0 0 0  Change in appetite 3 3 0 0 0  Feeling bad or failure about yourself  3 3 0 0 0  Trouble concentrating 3 3 0 0 0  Moving slowly or fidgety/restless 0 0 0 0 0  Suicidal thoughts 0 1 0 0 0  PHQ-9 Score 20 22 0 0 0  Difficult doing work/chores Very difficult Extremely dIfficult Not difficult at all Not difficult at all Not difficult at all   PHQ-2/9 Result is positive - see A&P below  Fall Risk:    06/23/2023    9:15 AM 04/24/2023    8:15 AM 04/07/2023    9:40 AM 02/03/2023   10:16 AM 12/10/2022   10:03 AM  Fall Risk   Falls in the past year? 1 1 1  0 0  Number falls in past yr: 0 0 0 0 0  Injury with Fall? 1 1 1  0 0  Risk for fall due to : Impaired balance/gait Impaired balance/gait Impaired balance/gait No Fall Risks No Fall Risks  Follow up Falls prevention discussed;Education provided;Falls evaluation completed Falls prevention discussed;Education provided;Falls evaluation completed Falls  prevention discussed;Education provided;Falls evaluation completed Falls prevention discussed;Education provided;Falls evaluation completed Falls prevention discussed;Education provided;Falls evaluation completed     Assessment and Plan:     ICD-10-CM   1. Severe episode of recurrent major depressive disorder, without psychotic features (HCC)  F33.2 sertraline (ZOLOFT) 50 MG tablet    sertraline (ZOLOFT) 100 MG tablet    Ambulatory referral to Psychiatry   phq 9 very positive, pt sx not much improved after restarting zoloft, he did not keep close f/up appt, dose increase with close f/up OV, try to est with psych    2. Anxiety  F41.9 sertraline (ZOLOFT) 50 MG tablet    sertraline (ZOLOFT) 100 MG tablet   increase zoloft dose to 75 mg x 1-2 weeks and then to 100 mg daily, referral for new consult/psych ooffice     Dose increase to 75 mg and then to 100 mg daily on  zoloft  Close f/up in 4 weeks if not est with psychiatry May need different medication that what he used previously, consider change to lexapro or prozac or adding wellbutrin if not responding to zoloft Many need SNRI Really have urged pt to work with psychiatry - [facility] may be very helpful with genetic/drug testing and with pt's medial complexity He tried [facility] and had a bad experience  I discussed the assessment and treatment plan with the patient. The patient was provided an opportunity to ask questions and all were answered. The patient agreed with the plan and demonstrated an understanding of the instructions.  The patient was advised to call back or seek an in-person evaluation if the symptoms worsen or if the condition fails to improve as anticipated.  I provided 25+ minutes of non-face-to-face time during this encounter.  Danelle Berry, PA-C 06/23/23 10:11 AM

## 2023-06-24 DIAGNOSIS — N1831 Chronic kidney disease, stage 3a: Secondary | ICD-10-CM | POA: Diagnosis not present

## 2023-06-24 DIAGNOSIS — M3214 Glomerular disease in systemic lupus erythematosus: Secondary | ICD-10-CM | POA: Diagnosis not present

## 2023-06-24 DIAGNOSIS — I10 Essential (primary) hypertension: Secondary | ICD-10-CM | POA: Diagnosis not present

## 2023-06-24 DIAGNOSIS — R809 Proteinuria, unspecified: Secondary | ICD-10-CM | POA: Diagnosis not present

## 2023-06-26 DIAGNOSIS — F332 Major depressive disorder, recurrent severe without psychotic features: Secondary | ICD-10-CM | POA: Diagnosis not present

## 2023-06-29 DIAGNOSIS — Z86718 Personal history of other venous thrombosis and embolism: Secondary | ICD-10-CM | POA: Diagnosis not present

## 2023-06-29 DIAGNOSIS — Z7901 Long term (current) use of anticoagulants: Secondary | ICD-10-CM | POA: Diagnosis not present

## 2023-06-29 DIAGNOSIS — L601 Onycholysis: Secondary | ICD-10-CM | POA: Diagnosis not present

## 2023-06-29 DIAGNOSIS — L608 Other nail disorders: Secondary | ICD-10-CM | POA: Diagnosis not present

## 2023-06-29 DIAGNOSIS — M79674 Pain in right toe(s): Secondary | ICD-10-CM | POA: Diagnosis not present

## 2023-06-29 DIAGNOSIS — M3214 Glomerular disease in systemic lupus erythematosus: Secondary | ICD-10-CM | POA: Diagnosis not present

## 2023-06-29 DIAGNOSIS — M79675 Pain in left toe(s): Secondary | ICD-10-CM | POA: Diagnosis not present

## 2023-06-29 DIAGNOSIS — B351 Tinea unguium: Secondary | ICD-10-CM | POA: Diagnosis not present

## 2023-06-30 ENCOUNTER — Ambulatory Visit (INDEPENDENT_AMBULATORY_CARE_PROVIDER_SITE_OTHER): Payer: BLUE CROSS/BLUE SHIELD | Admitting: Vascular Surgery

## 2023-06-30 ENCOUNTER — Encounter: Payer: Self-pay | Admitting: Urology

## 2023-07-01 ENCOUNTER — Encounter: Payer: Self-pay | Admitting: Gastroenterology

## 2023-07-01 ENCOUNTER — Ambulatory Visit: Payer: Medicaid Other

## 2023-07-01 ENCOUNTER — Encounter
Admission: RE | Disposition: A | Discharge status: Home / Self Care | Payer: Self-pay | Source: Home / Self Care | Attending: Gastroenterology

## 2023-07-01 ENCOUNTER — Ambulatory Visit: Payer: BC Managed Care – PPO | Admitting: Urology

## 2023-07-01 ENCOUNTER — Ambulatory Visit
Admission: RE | Admit: 2023-07-01 | Discharge: 2023-07-01 | Disposition: A | Discharge status: Home / Self Care | Payer: Medicaid Other | Attending: Gastroenterology | Admitting: Gastroenterology

## 2023-07-01 DIAGNOSIS — M329 Systemic lupus erythematosus, unspecified: Secondary | ICD-10-CM | POA: Insufficient documentation

## 2023-07-01 DIAGNOSIS — Z86711 Personal history of pulmonary embolism: Secondary | ICD-10-CM | POA: Insufficient documentation

## 2023-07-01 DIAGNOSIS — I2510 Atherosclerotic heart disease of native coronary artery without angina pectoris: Secondary | ICD-10-CM | POA: Diagnosis not present

## 2023-07-01 DIAGNOSIS — I25118 Atherosclerotic heart disease of native coronary artery with other forms of angina pectoris: Secondary | ICD-10-CM | POA: Diagnosis not present

## 2023-07-01 DIAGNOSIS — Z09 Encounter for follow-up examination after completed treatment for conditions other than malignant neoplasm: Secondary | ICD-10-CM | POA: Diagnosis present

## 2023-07-01 DIAGNOSIS — Z86718 Personal history of other venous thrombosis and embolism: Secondary | ICD-10-CM | POA: Insufficient documentation

## 2023-07-01 DIAGNOSIS — I739 Peripheral vascular disease, unspecified: Secondary | ICD-10-CM | POA: Insufficient documentation

## 2023-07-01 DIAGNOSIS — K5730 Diverticulosis of large intestine without perforation or abscess without bleeding: Secondary | ICD-10-CM | POA: Insufficient documentation

## 2023-07-01 DIAGNOSIS — Z6830 Body mass index (BMI) 30.0-30.9, adult: Secondary | ICD-10-CM | POA: Diagnosis not present

## 2023-07-01 DIAGNOSIS — E785 Hyperlipidemia, unspecified: Secondary | ICD-10-CM | POA: Insufficient documentation

## 2023-07-01 DIAGNOSIS — Z86010 Personal history of colonic polyps: Secondary | ICD-10-CM | POA: Diagnosis not present

## 2023-07-01 DIAGNOSIS — Z8616 Personal history of COVID-19: Secondary | ICD-10-CM | POA: Diagnosis not present

## 2023-07-01 DIAGNOSIS — D122 Benign neoplasm of ascending colon: Secondary | ICD-10-CM | POA: Diagnosis not present

## 2023-07-01 DIAGNOSIS — I129 Hypertensive chronic kidney disease with stage 1 through stage 4 chronic kidney disease, or unspecified chronic kidney disease: Secondary | ICD-10-CM | POA: Diagnosis not present

## 2023-07-01 DIAGNOSIS — J449 Chronic obstructive pulmonary disease, unspecified: Secondary | ICD-10-CM | POA: Diagnosis not present

## 2023-07-01 DIAGNOSIS — D126 Benign neoplasm of colon, unspecified: Secondary | ICD-10-CM

## 2023-07-01 DIAGNOSIS — I10 Essential (primary) hypertension: Secondary | ICD-10-CM | POA: Insufficient documentation

## 2023-07-01 DIAGNOSIS — N183 Chronic kidney disease, stage 3 unspecified: Secondary | ICD-10-CM | POA: Diagnosis not present

## 2023-07-01 DIAGNOSIS — K635 Polyp of colon: Secondary | ICD-10-CM | POA: Diagnosis not present

## 2023-07-01 HISTORY — DX: Personal history of urinary calculi: Z87.442

## 2023-07-01 HISTORY — PX: COLONOSCOPY WITH PROPOFOL: SHX5780

## 2023-07-01 HISTORY — PX: POLYPECTOMY: SHX5525

## 2023-07-01 SURGERY — COLONOSCOPY WITH PROPOFOL
Anesthesia: General

## 2023-07-01 MED ORDER — PROPOFOL 500 MG/50ML IV EMUL
INTRAVENOUS | Status: DC | PRN
  Administered 2023-07-01: 50 mg via INTRAVENOUS
  Administered 2023-07-01: 20 mg via INTRAVENOUS
  Administered 2023-07-01: 150 ug/kg/min via INTRAVENOUS
  Administered 2023-07-01: 20 mg via INTRAVENOUS

## 2023-07-01 MED ORDER — DIPHENHYDRAMINE HCL 50 MG/ML IJ SOLN
INTRAMUSCULAR | Status: DC | PRN
  Administered 2023-07-01: 12.5 mg via INTRAVENOUS

## 2023-07-01 MED ORDER — DEXAMETHASONE SODIUM PHOSPHATE 10 MG/ML IJ SOLN
INTRAMUSCULAR | Status: DC | PRN
  Administered 2023-07-01: 5 mg via INTRAVENOUS

## 2023-07-01 MED ORDER — SODIUM CHLORIDE 0.9 % IV SOLN: INTRAVENOUS | Status: DC | PRN

## 2023-07-01 MED ORDER — SODIUM CHLORIDE 0.9 % IV SOLN
INTRAVENOUS | Status: DC
  Administered 2023-07-01: 1000 mL via INTRAVENOUS

## 2023-07-01 MED ORDER — ONDANSETRON HCL 4 MG/2ML IJ SOLN
INTRAMUSCULAR | Status: DC | PRN
  Administered 2023-07-01: 4 mg via INTRAVENOUS

## 2023-07-01 NOTE — H&P (Signed)
Wyline Mood, MD [address], [address], [place], [name], [address] [address], [address], [place], [name], [address] Phone: [phone]  Fax: [phone]  Primary Care Physician:  Danelle Berry, PA-C   Pre-Procedure History & Physical: HPI:  Jonathon Snow is a 62 y.o. male is here for an colonoscopy.   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   Overview:  superior sagittal sinus, left transverse sinus and cortical veins    COVID-19 virus infection 12/2020   Depression    DVT (deep venous thrombosis) (HCC)    Dyspnea    GERD (gastroesophageal reflux disease)    Heel spur, left 02/16/2019   Heel spur, right 02/16/2019   Herpes zoster infection of lumbar region 02/20/2020   Hyperlipidemia    Hypertension    Lupus (HCC)    Lymphedema 10/07/2018   Morbid obesity (HCC)    Opiate abuse, episodic (HCC) 02/26/2018   Osteoporosis    Pneumonia    PONV (postoperative nausea and vomiting)    Postphlebitic syndrome with ulcer, 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   Presence of IVC filter 03/22/2020   Removed   Pulmonary embolism (HCC)    Renal disorder    Stage III    Past Surgical History:  Procedure Laterality Date   ANKLE SURGERY Right    BACK SURGERY     BRONCHIAL WASHINGS N/A 11/01/2021   Procedure: BRONCHIAL WASHINGS;  Surgeon: Vida Rigger, MD;  Location: [facility] ORS;  Service: Thoracic;  Laterality: N/A;   COLONOSCOPY WITH PROPOFOL N/A 05/28/2020   Procedure: COLONOSCOPY WITH PROPOFOL;  Surgeon: Wyline Mood, MD;  Location: [facility] ENDOSCOPY;  Service: Endoscopy;  Laterality: N/A;   CYST EXCISION  92 or 93    Liver cyst removal [facility]   FLEXIBLE BRONCHOSCOPY N/A 11/01/2021   Procedure: FLEXIBLE BRONCHOSCOPY;  Surgeon: Vida Rigger, MD;  Location: [facility] ORS;  Service: Thoracic;  Laterality: N/A;   HIP PINNING,CANNULATED Left 02/19/2023   Procedure: PERCUTANEOUS FIXATION OF FEMORAL NECK;  Surgeon: Signa Kell,  MD;  Location: [facility] ORS;  Service: Orthopedics;  Laterality: Left;   I & D EXTREMITY Right 04/29/2017   Procedure: IRRIGATION AND DEBRIDEMENT EXTREMITY;  Surgeon: Ricarda Frame, MD;  Location: [facility] ORS;  Service: General;  Laterality: Right;   IRRIGATION AND DEBRIDEMENT ABSCESS Left 04/29/2017   Procedure: IRRIGATION AND DEBRIDEMENT Scrotal ABSCESS;  Surgeon: Ricarda Frame, MD;  Location: [facility] ORS;  Service: General;  Laterality: Left;    Prior to Admission medications   Medication Sig Start Date End Date Taking? Authorizing Provider  albuterol (VENTOLIN HFA) 108 (90 Base) MCG/ACT inhaler Inhale 2 puffs into the lungs every 6 (six) hours as needed for wheezing or shortness of breath. 10/29/22   Berniece Salines, FNP  amLODipine (NORVASC) 5 MG tablet Take 1 tablet (5 mg total) by mouth daily. 04/07/23   Danelle Berry, PA-C  apixaban (ELIQUIS) 5 MG TABS tablet Take 1 tablet (5 mg total) by mouth 2 (two) times daily. 10/01/22   Earna Coder, MD  cholecalciferol (VITAMIN D3) 25 MCG (1000 UNIT) tablet Take 1,000 Units by mouth daily.    [provider]  gabapentin (NEURONTIN) 300 MG capsule Take 1 capsule by mouth 3 (three) times daily. 04/01/23 03/31/24  [provider]  levocetirizine (XYZAL) 5 MG tablet Take 1 tablet (5 mg total) by mouth every evening. 12/08/22   Danelle Berry, PA-C  LORazepam (ATIVAN) 0.5 MG tablet Take 1 tablet (0.5  mg total) by mouth 2 (two) times daily as needed for anxiety. 04/24/23   Danelle Berry, PA-C  losartan (COZAAR) 100 MG tablet Take 1 tablet (100 mg total) by mouth daily. 04/07/23   Danelle Berry, PA-C  meclizine (ANTIVERT) 25 MG tablet Take 0.5-1 tablets (12.5-25 mg total) by mouth 3 (three) times daily as needed for dizziness. Patient not taking: Reported on 04/07/2023 09/09/22   Danelle Berry, PA-C  montelukast (SINGULAIR) 10 MG tablet Take 1 tablet (10 mg total) by mouth at bedtime. 12/08/22   Danelle Berry, PA-C  Multiple Vitamins-Minerals  (MULTIVITAMIN WITH MINERALS) tablet Take 1 tablet by mouth daily.    [provider]  mycophenolate (CELLCEPT) 500 MG tablet Take 1,000 mg by mouth 2 (two) times daily.    [provider]  Olopatadine-Mometasone [facility]al [employer]) [phone] MCG/ACT SUSP Place 2 sprays into the nose 2 (two) times daily as needed. 12/08/22   Danelle Berry, PA-C  ondansetron (ZOFRAN) 4 MG tablet Take 1 tablet (4 mg total) by mouth every 6 (six) hours as needed for nausea. 02/20/23   Dedra Skeens, PA-C  oxyCODONE (OXY IR/ROXICODONE) 5 MG immediate release tablet Take 0.5-1 tablets (2.5-5 mg total) by mouth every 4 (four) hours as needed for moderate pain (pain score 4-6). Patient not taking: Reported on 06/23/2023 02/20/23   Dedra Skeens, PA-C  predniSONE (DELTASONE) 10 MG tablet Take 1 tablet by mouth daily. 11/28/22   [provider]  promethazine-dextromethorphan (PROMETHAZINE-DM) 6.25-15 MG/5ML syrup Take 5 mLs by mouth 4 (four) times daily as needed for cough. Patient not taking: Reported on 06/23/2023 12/10/22   Mecum, Erin E, PA-C  rosuvastatin (CRESTOR) 10 MG tablet Take 1 tablet (10 mg total) by mouth daily. 04/07/23   Danelle Berry, PA-C  sertraline (ZOLOFT) 100 MG tablet Take 1 tablet (100 mg total) by mouth daily. 07/07/23   Danelle Berry, PA-C  sertraline (ZOLOFT) 50 MG tablet Take 1.5 tablets (75 mg total) by mouth daily for 14 days. 06/23/23 07/07/23  Danelle Berry, PA-C  sildenafil (REVATIO) 20 MG tablet TAKE 3 TO 5 TABLETS BY MOUTH AS NEEDED 30 MIN PRIOR TO INTERCOURSE 03/25/23   Vanna Scotland, MD  traMADol (ULTRAM) 50 MG tablet Take 1 tablet (50 mg total) by mouth every 6 (six) hours as needed for moderate pain. 02/20/23   Dedra Skeens, PA-C    Allergies as of 04/06/2023 - Review Complete 04/03/2023  Allergen Reaction Noted   Enalapril Other (See Comments) 07/31/2015   Vicodin [hydrocodone-acetaminophen] Hives and Rash 12/09/2013    Family History  Problem Relation Age of Onset   Hypertension Father     Heart disease Father    Clotting disorder Mother    Kidney disease Brother    Heart attack Maternal Grandmother    Heart attack Maternal Grandfather    Heart attack Paternal Grandfather     Social History   Socioeconomic History   Marital status: Divorced    Spouse name: Not on file   Number of children: Not on file   Years of education: Not on file   Highest education level: Not on file  Occupational History   Not on file  Tobacco Use   Smoking status: Never   Smokeless tobacco: Never  Vaping Use   Vaping Use: Never used  Substance and Sexual Activity   Alcohol use: No   Drug use: No    Comment: PT DENIES   Sexual activity: Yes  Other Topics Concern   Not on file  Social History  Narrative   Not on file   Social Determinants of Health   Financial Resource Strain: Not on file  Food Insecurity: No Food Insecurity (02/19/2023)   Hunger Vital Sign    Worried About Running Out of Food in the Last Year: Never true    Ran Out of Food in the Last Year: Never true  Transportation Needs: No Transportation Needs (02/19/2023)   PRAPARE - Administrator, Civil Service (Medical): No    Lack of Transportation (Non-Medical): No  Physical Activity: Not on file  Stress: Not on file  Social Connections: Not on file  Intimate Partner Violence: Not At Risk (02/19/2023)   Humiliation, Afraid, Rape, and Kick questionnaire    Fear of Current or Ex-Partner: No    Emotionally Abused: No    Physically Abused: No    Sexually Abused: No    Review of Systems: See HPI, otherwise negative ROS  Physical Exam: There were no vitals taken for this visit. General:   Alert,  pleasant and cooperative in NAD Head:  Normocephalic and atraumatic. Neck:  Supple; no masses or thyromegaly. Lungs:  Clear throughout to auscultation, normal respiratory effort.    Heart:  +S1, +S2, Regular rate and rhythm, No edema. Abdomen:  Soft, nontender and nondistended. Normal bowel sounds, without  guarding, and without rebound.   Neurologic:  Alert and  oriented x4;  grossly normal neurologically.  Impression/Plan: Jonathon Snow is here for an colonoscopy to be performed for surveillance due to prior history of colon polyps   Risks, benefits, limitations, and alternatives regarding  colonoscopy have been reviewed with the patient.  Questions have been answered.  All parties agreeable.   Wyline Mood, MD  07/01/2023, 8:41 AM

## 2023-07-01 NOTE — Transfer of Care (Signed)
Immediate Anesthesia Transfer of Care Note  Patient: Jonathon Snow  Procedure(s) Performed: COLONOSCOPY WITH PROPOFOL POLYPECTOMY  Patient Location: PACU  Anesthesia Type:MAC  Level of Consciousness: drowsy  Airway & Oxygen Therapy: Patient Spontanous Breathing and Patient connected to face mask oxygen  Post-op Assessment: Report given to RN and Post -op Vital signs reviewed and stable  Post vital signs: Reviewed  Last Vitals:  Vitals Value Taken Time  BP 103/61 07/01/23 0945  Temp 989F   Pulse 80 07/01/23 0946  Resp 36 07/01/23 0946  SpO2 94 % 07/01/23 0946  Vitals shown include unvalidated device data.  Last Pain:  Vitals:   07/01/23 0847  TempSrc: Temporal  PainSc: 6          Complications: No notable events documented.

## 2023-07-01 NOTE — Anesthesia Postprocedure Evaluation (Signed)
Anesthesia Post Note  Patient: Jonathon Snow  Procedure(s) Performed: COLONOSCOPY WITH PROPOFOL POLYPECTOMY  Patient location during evaluation: Endoscopy Anesthesia Type: General Level of consciousness: awake and alert Pain management: pain level controlled Vital Signs Assessment: post-procedure vital signs reviewed and stable Respiratory status: spontaneous breathing, nonlabored ventilation, respiratory function stable and patient connected to nasal cannula oxygen Cardiovascular status: blood pressure returned to baseline and stable Postop Assessment: no apparent nausea or vomiting Anesthetic complications: no   No notable events documented.   Last Vitals:  Vitals:   07/01/23 0954 07/01/23 1004  BP: 117/65 137/81  Pulse: 76 74  Resp: (!) 35 13  Temp:    SpO2: 98% 100%    Last Pain:  Vitals:   07/01/23 1004  TempSrc:   PainSc: 0-No pain                 Cleda Mccreedy Mae Cianci

## 2023-07-01 NOTE — Op Note (Signed)
[facility] Patient Name: Jonathon Snow Procedure Date: 07/01/2023 9:16 AM MRN: [id number] Account #: [id number] Date of Birth: [birth year] Admit Type: Outpatient Age: 62 Room: [facility] ENDO ROOM 3 Gender: Male Note Status: Finalized Instrument Name: Nelda Marseille [id number] Procedure:             Colonoscopy Indications:           Surveillance: Personal history of adenomatous polyps                         on last colonoscopy > 3 years ago Providers:             Wyline Mood MD, MD Referring MD:          Wyline Mood MD, MD (Referring MD), Danelle Berry                         (Referring MD) Medicines:             Monitored Anesthesia Care Complications:         No immediate complications. Procedure:             Pre-Anesthesia Assessment:                        - Prior to the procedure, a History and Physical was                         performed, and patient medications, allergies and                         sensitivities were reviewed. The patient's tolerance                         of previous anesthesia was reviewed.                        - The risks and benefits of the procedure and the                         sedation options and risks were discussed with the                         patient. All questions were answered and informed                         consent was obtained.                        - ASA Grade Assessment: II - A patient with mild                         systemic disease.                        After obtaining informed consent, the colonoscope was                         passed under direct vision. Throughout the procedure,                         the patient's  blood pressure, pulse, and oxygen                         saturations were monitored continuously. The                         Colonoscope was introduced through the anus and                         advanced to the the cecum, identified by the                         appendiceal  orifice. The ileocecal valve, appendiceal                         orifice, and rectum were photographed. Findings:      The perianal and digital rectal examinations were normal.      An 8 mm polyp was found in the ascending colon. The polyp was sessile.       The polyp was removed with a cold snare. Resection and retrieval were       complete.      A 3 mm polyp was found in the ascending colon. The polyp was sessile.       The polyp was removed with a cold biopsy forceps. Resection and       retrieval were complete.      Multiple medium-mouthed diverticula were found in the sigmoid colon.      The exam was otherwise without abnormality on direct and retroflexion       views. Impression:            - One 8 mm polyp in the ascending colon, removed with                         a cold snare. Resected and retrieved.                        - One 3 mm polyp in the ascending colon, removed with                         a cold biopsy forceps. Resected and retrieved.                        - Diverticulosis in the sigmoid colon.                        - The examination was otherwise normal on direct and                         retroflexion views. Recommendation:        - Discharge patient to home (with escort).                        - Resume previous diet.                        - Continue present medications.                        - Await pathology results.                        -  Repeat colonoscopy in 7 years for surveillance. Procedure Code(s):     --- Professional ---                        [phone], Colonoscopy, flexible; with removal of                         tumor(s), polyp(s), or other lesion(s) by snare                         technique                        [id number], 59, Colonoscopy, flexible; with biopsy, single                         or multiple Diagnosis Code(s):     --- Professional ---                        Z86.010, Personal history of colonic polyps                        D12.2,  Benign neoplasm of ascending colon                        K57.30, Diverticulosis of large intestine without                         perforation or abscess without bleeding CPT copyright 2022 [employer]. All rights reserved. The codes documented in this report are preliminary and upon coder review may  be revised to meet current compliance requirements. Wyline Mood, MD Wyline Mood MD, MD 07/01/2023 9:45:45 AM This report has been signed electronically. Number of Addenda: 0 Note Initiated On: 07/01/2023 9:16 AM Scope Withdrawal Time: 0 hours 10 minutes 46 seconds  Total Procedure Duration: 0 hours 15 minutes 25 seconds  Estimated Blood Loss:  Estimated blood loss: none.      [facility]

## 2023-07-01 NOTE — Anesthesia Preprocedure Evaluation (Signed)
Anesthesia Evaluation  Patient identified by MRN, date of birth, ID band Patient awake    Reviewed: Allergy & Precautions, NPO status , Patient's Chart, lab work & pertinent test results  History of Anesthesia Complications (+) PONV and history of anesthetic complications  Airway Mallampati: III  TM Distance: <3 FB Neck ROM: full    Dental  (+) Chipped   Pulmonary COPD   Pulmonary exam normal        Cardiovascular Exercise Tolerance: Good hypertension, (-) angina + CAD and + Peripheral Vascular Disease  Normal cardiovascular exam     Neuro/Psych  PSYCHIATRIC DISORDERS       Neuromuscular disease    GI/Hepatic Neg liver ROS,GERD  Controlled,,  Endo/Other  negative endocrine ROS    Renal/GU Renal disease  negative genitourinary   Musculoskeletal   Abdominal   Peds  Hematology negative hematology ROS (+)   Anesthesia Other Findings 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     Comment:  Overview:  superior sagittal sinus, left transverse               sinus and cortical veins  12/2020: COVID-19 virus infection No date: Depression No date: DVT (deep venous thrombosis) (HCC) No date: Dyspnea No date: GERD (gastroesophageal reflux disease) 02/16/2019: Heel spur, left 02/16/2019: Heel spur, right 02/20/2020: Herpes zoster infection of lumbar region No date: History of kidney stones No date: Hyperlipidemia No date: Hypertension No date: Lupus (HCC) 10/07/2018: Lymphedema No date: Morbid obesity (HCC) 02/26/2018: Opiate abuse, episodic (HCC) No date: Osteoporosis No date: Pneumonia No date: PONV (postoperative nausea and vomiting) 11/18/2016: Postphlebitic syndrome with ulcer, left (HCC) 03/22/2020: Presence of IVC filter     Comment:  Removed No date: Pulmonary embolism (HCC) No date: Renal disorder     Comment:  Stage III  Past Surgical  History: No date: ANKLE SURGERY; Right No date: BACK SURGERY 11/01/2021: BRONCHIAL WASHINGS; N/A     Comment:  Procedure: BRONCHIAL WASHINGS;  Surgeon: Vida Rigger, MD;  Location: [facility] ORS;  Service: Thoracic;                Laterality: N/A; 05/28/2020: COLONOSCOPY WITH PROPOFOL; N/A     Comment:  Procedure: COLONOSCOPY WITH PROPOFOL;  Surgeon: Wyline Mood, MD;  Location: [facility] ENDOSCOPY;  Service:               Endoscopy;  Laterality: N/A; 92 or 93 : CYST EXCISION     Comment:  Liver cyst removal [facility] 11/01/2021: FLEXIBLE BRONCHOSCOPY; N/A     Comment:  Procedure: FLEXIBLE BRONCHOSCOPY;  Surgeon: Vida Rigger, MD;  Location: [facility] ORS;  Service: Thoracic;                Laterality: N/A; 02/19/2023: HIP PINNING,CANNULATED; Left     Comment:  Procedure: PERCUTANEOUS FIXATION OF FEMORAL NECK;                Surgeon: Signa Kell, MD;  Location: [facility] ORS;  Service:              Orthopedics;  Laterality: Left; 04/29/2017: I & D EXTREMITY; Right     Comment:  Procedure: IRRIGATION AND DEBRIDEMENT EXTREMITY;  Surgeon: Ricarda Frame, MD;  Location: [facility] ORS;                Service: General;  Laterality: Right; 04/29/2017: IRRIGATION AND DEBRIDEMENT ABSCESS; Left     Comment:  Procedure: IRRIGATION AND DEBRIDEMENT Scrotal ABSCESS;                Surgeon: Ricarda Frame, MD;  Location: [facility] ORS;                Service: General;  Laterality: Left;  BMI    Body Mass Index: 30.36 kg/m      Reproductive/Obstetrics negative OB ROS                             Anesthesia Physical Anesthesia Plan  ASA: 3  Anesthesia Plan: General   Post-op Pain Management:    Induction: Intravenous  PONV Risk Score and Plan: Propofol infusion and TIVA  Airway Management Planned: Natural Airway and Nasal Cannula  Additional Equipment:   Intra-op Plan:   Post-operative Plan:   Informed Consent: I have  reviewed the patients History and Physical, chart, labs and discussed the procedure including the risks, benefits and alternatives for the proposed anesthesia with the patient or authorized representative who has indicated his/her understanding and acceptance.     Dental Advisory Given  Plan Discussed with: Anesthesiologist, CRNA and Surgeon  Anesthesia Plan Comments: (Patient consented for risks of anesthesia including but not limited to:  - adverse reactions to medications - risk of airway placement if required - damage to eyes, teeth, lips or other oral mucosa - nerve damage due to positioning  - sore throat or hoarseness - Damage to heart, brain, nerves, lungs, other parts of body or loss of life  Patient voiced understanding.)       Anesthesia Quick Evaluation

## 2023-07-02 ENCOUNTER — Ambulatory Visit: Payer: Medicaid Other | Admitting: Podiatry

## 2023-07-02 ENCOUNTER — Encounter: Payer: Self-pay | Admitting: Gastroenterology

## 2023-07-02 DIAGNOSIS — F332 Major depressive disorder, recurrent severe without psychotic features: Secondary | ICD-10-CM | POA: Diagnosis not present

## 2023-07-03 ENCOUNTER — Encounter: Payer: Self-pay | Admitting: Gastroenterology

## 2023-07-07 ENCOUNTER — Ambulatory Visit: Payer: Medicaid Other | Attending: Vascular Surgery | Admitting: Occupational Therapy

## 2023-07-07 ENCOUNTER — Encounter: Payer: Self-pay | Admitting: Family Medicine

## 2023-07-07 ENCOUNTER — Encounter: Payer: Self-pay | Admitting: Occupational Therapy

## 2023-07-07 ENCOUNTER — Ambulatory Visit (INDEPENDENT_AMBULATORY_CARE_PROVIDER_SITE_OTHER): Payer: Medicaid Other | Admitting: Family Medicine

## 2023-07-07 VITALS — BP 138/84 | HR 98 | Temp 97.9°F | Resp 16 | Ht 74.0 in | Wt 237.6 lb

## 2023-07-07 DIAGNOSIS — I890 Lymphedema, not elsewhere classified: Secondary | ICD-10-CM

## 2023-07-07 DIAGNOSIS — Z7952 Long term (current) use of systemic steroids: Secondary | ICD-10-CM | POA: Diagnosis not present

## 2023-07-07 DIAGNOSIS — Z7189 Other specified counseling: Secondary | ICD-10-CM | POA: Diagnosis not present

## 2023-07-07 DIAGNOSIS — I10 Essential (primary) hypertension: Secondary | ICD-10-CM

## 2023-07-07 DIAGNOSIS — F332 Major depressive disorder, recurrent severe without psychotic features: Secondary | ICD-10-CM | POA: Insufficient documentation

## 2023-07-07 DIAGNOSIS — J449 Chronic obstructive pulmonary disease, unspecified: Secondary | ICD-10-CM | POA: Diagnosis not present

## 2023-07-07 DIAGNOSIS — R7303 Prediabetes: Secondary | ICD-10-CM

## 2023-07-07 DIAGNOSIS — D699 Hemorrhagic condition, unspecified: Secondary | ICD-10-CM | POA: Diagnosis not present

## 2023-07-07 DIAGNOSIS — M5442 Lumbago with sciatica, left side: Secondary | ICD-10-CM | POA: Diagnosis not present

## 2023-07-07 DIAGNOSIS — Z7901 Long term (current) use of anticoagulants: Secondary | ICD-10-CM

## 2023-07-07 DIAGNOSIS — E782 Mixed hyperlipidemia: Secondary | ICD-10-CM | POA: Diagnosis not present

## 2023-07-07 DIAGNOSIS — N1831 Chronic kidney disease, stage 3a: Secondary | ICD-10-CM | POA: Diagnosis not present

## 2023-07-07 DIAGNOSIS — G629 Polyneuropathy, unspecified: Secondary | ICD-10-CM | POA: Diagnosis not present

## 2023-07-07 DIAGNOSIS — M3214 Glomerular disease in systemic lupus erythematosus: Secondary | ICD-10-CM | POA: Diagnosis not present

## 2023-07-07 DIAGNOSIS — G8929 Other chronic pain: Secondary | ICD-10-CM | POA: Insufficient documentation

## 2023-07-07 DIAGNOSIS — M810 Age-related osteoporosis without current pathological fracture: Secondary | ICD-10-CM

## 2023-07-07 DIAGNOSIS — E669 Obesity, unspecified: Secondary | ICD-10-CM

## 2023-07-07 DIAGNOSIS — M4726 Other spondylosis with radiculopathy, lumbar region: Secondary | ICD-10-CM

## 2023-07-07 DIAGNOSIS — D692 Other nonthrombocytopenic purpura: Secondary | ICD-10-CM

## 2023-07-07 HISTORY — DX: Major depressive disorder, recurrent severe without psychotic features: F33.2

## 2023-07-07 NOTE — Assessment & Plan Note (Signed)
On gabapentin 300 mg BID - we discussed possibly increasing the dose or trying lyrica

## 2023-07-07 NOTE — Assessment & Plan Note (Signed)
Managed by nephrology, proteinuria, HTN, CKD has been stable on losartan and norvasc

## 2023-07-07 NOTE — Patient Instructions (Signed)
Consider (or ask psychiatry about these medications)  Adding cymbalta - which often helps with chronic pain And consider increasing gabapentin dose or trying lyrica instead - also for chronic pain

## 2023-07-07 NOTE — Assessment & Plan Note (Signed)
Per nephrology, reviewed last OV and plan 06/24/2023 seeing every 3 months on cellcept and prednisone

## 2023-07-07 NOTE — Assessment & Plan Note (Signed)
Multiple areas of bruising to arms/hands

## 2023-07-07 NOTE — Assessment & Plan Note (Signed)
He will be managing with [facility] who have kept him on zoloft and added abilify - he notes only some increased aggitation/irritability - encouraged him to let them know about SE and keep his f/up appts

## 2023-07-07 NOTE — Assessment & Plan Note (Signed)
Managed by vascular, wearing compression stockings

## 2023-07-07 NOTE — Assessment & Plan Note (Signed)
On eliquis per hem/onc

## 2023-07-07 NOTE — Assessment & Plan Note (Signed)
Bp near goal today while pt was very distressed and in pain, no change to meds Continue losartan 100 mg and norvasc 5 mg daily BP Readings from Last 3 Encounters:  07/07/23 138/84  07/01/23 137/81  04/24/23 132/84

## 2023-07-07 NOTE — Assessment & Plan Note (Signed)
Currently flared up with radiation to left leg

## 2023-07-07 NOTE — Assessment & Plan Note (Signed)
Was previously managed by specialists - at high risk with SLE/nephritis, CKD longterm steroid use Will check labs, I do not see that anyone has managed this recently

## 2023-07-07 NOTE — Assessment & Plan Note (Signed)
On crestor, last labs LDL poorly controlled, due for recheck of labs

## 2023-07-07 NOTE — Therapy (Unsigned)
OUTPATIENT PHYSICAL THERAPY LOWER EXTREMITY LYMPHEDEMA EVALUATION  Patient Name: Jonathon Snow MRN: [id number] DOB:October 11, 1961, 62 y.o., male Today's Date: 07/07/2023  END OF SESSION:   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   Overview:  superior sagittal sinus, left transverse sinus and cortical veins    COVID-19 virus infection 12/2020   Depression    DVT (deep venous thrombosis) (HCC)    Dyspnea    GERD (gastroesophageal reflux disease)    Heel spur, left 02/16/2019   Heel spur, right 02/16/2019   Herpes zoster infection of lumbar region 02/20/2020   History of kidney stones    Hyperlipidemia    Hypertension    Lupus (HCC)    Lymphedema 10/07/2018   Morbid obesity (HCC)    Opiate abuse, episodic (HCC) 02/26/2018   Osteoporosis    Pneumonia    PONV (postoperative nausea and vomiting)    Postphlebitic syndrome with ulcer, 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   Presence of IVC filter 03/22/2020   Removed   Pulmonary embolism (HCC)    Renal disorder    Stage III   Past Surgical History:  Procedure Laterality Date   ANKLE SURGERY Right    BACK SURGERY     BRONCHIAL WASHINGS N/A 11/01/2021   Procedure: BRONCHIAL WASHINGS;  Surgeon: Vida Rigger, MD;  Location: [facility] ORS;  Service: Thoracic;  Laterality: N/A;   COLONOSCOPY WITH PROPOFOL N/A 05/28/2020   Procedure: COLONOSCOPY WITH PROPOFOL;  Surgeon: Wyline Mood, MD;  Location: [facility] ENDOSCOPY;  Service: Endoscopy;  Laterality: N/A;   COLONOSCOPY WITH PROPOFOL N/A 07/01/2023   Procedure: COLONOSCOPY WITH PROPOFOL;  Surgeon: Wyline Mood, MD;  Location: [facility] ENDOSCOPY;  Service: Gastroenterology;  Laterality: N/A;   CYST EXCISION  92 or 93    Liver cyst removal [facility]   FLEXIBLE BRONCHOSCOPY N/A 11/01/2021   Procedure: FLEXIBLE BRONCHOSCOPY;  Surgeon: Vida Rigger, MD;  Location: [facility]S;  Service: Thoracic;  Laterality: N/A;   HIP PINNING,CANNULATED Left  02/19/2023   Procedure: PERCUTANEOUS FIXATION OF FEMORAL NECK;  Surgeon: Signa Kell, MD;  Location: [facility] ORS;  Service: Orthopedics;  Laterality: Left;   I & D EXTREMITY Right 04/29/2017   Procedure: IRRIGATION AND DEBRIDEMENT EXTREMITY;  Surgeon: Ricarda Frame, MD;  Location: [facility] ORS;  Service: General;  Laterality: Right;   IRRIGATION AND DEBRIDEMENT ABSCESS Left 04/29/2017   Procedure: IRRIGATION AND DEBRIDEMENT Scrotal ABSCESS;  Surgeon: Ricarda Frame, MD;  Location: [facility];  Service: General;  Laterality: Left;   POLYPECTOMY  07/01/2023   Procedure: POLYPECTOMY;  Surgeon: Wyline Mood, MD;  Location: [facility] ENDOSCOPY;  Service: Gastroenterology;;   Patient Active Problem List   Diagnosis Date Noted   Hx of colonic polyps 07/01/2023   Adenomatous polyp of colon 07/01/2023   SLE (systemic lupus erythematosus related syndrome) (HCC) 02/19/2023   Closed left hip fracture, initial encounter (HCC) 02/18/2023   Pain and swelling of right lower extremity 02/03/2023   Erectile disorder 01/22/2023   Coronary artery disease of native artery of native heart with stable angina pectoris (HCC) 09/09/2022   Current moderate episode of major depressive disorder (HCC) 09/09/2022   Varicose 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 01/03/2022   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) 09/18/2021   Prediabetes 11/08/2020   Stage 3a chronic kidney disease (HCC) 11/08/2020   Seasonal allergies 11/08/2020   Rhinosinusitis 11/08/2020   Long term current use of systemic steroids 11/08/2020   Anticoagulant disorder (HCC)  11/07/2020   Senile purpura (HCC) 11/07/2020   History of DVT (deep vein thrombosis) 03/22/2020   MDD (major depressive disorder), recurrent episode, mild (HCC) 09/12/2019   Other spondylosis with radiculopathy, lumbar region 02/16/2019   Osteoporosis 10/12/2018   Chronic venous insufficiency 10/07/2018   Lymphedema 10/07/2018   SLE glomerulonephritis syndrome, WHO  class V (HCC) 03/03/2018   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) 10/29/2016   Mixed hyperlipidemia 01/15/2016   Primary hypertension 01/15/2016   Obesity (BMI 30.0-34.9) 01/15/2016   Long term current use of anticoagulant 10/04/2015   Systemic lupus erythematosus (HCC) 05/30/2015   COPD, moderate (HCC) 06/27/2014   History of pulmonary embolism 12/11/2013   Nodule of right lung 12/11/2013   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 Yes: NPRS scale: 7/10 Pain location: L lower extremity- medial thigh and bottom of foot and toes Pain description: burning, tingling tight, stabbing, heavy Aggravating factors: walking, standing, extended sitting with dependent positioning Relieving factors: elevation  PRECAUTIONS: {Therapy precautions:24002}  RED FLAGS: {PT Red Flags:29287}   WEIGHT BEARING RESTRICTIONS: {Yes ***/No:24003}  FALLS:  Has patient fallen in last 6 months? Yes. Number of falls 1. Resulted in L hip fracture  LIVING ENVIRONMENT: Lives with: lives with their family and lives alone Lives in: House/apartment Stairs: No; External: 0 steps; none Has following equipment at home: shower chair  OCCUPATION: unemployed  LEISURE: outdoors person. I like fishing, camping, hiking  HAND DOMINANCE: right   PRIOR LEVEL OF FUNCTION: Independent  PATIENT GOALS: "I'd like to get back to the  condition I was in before all this started happening to me."   OBJECTIVE:  COGNITION:  Overall cognitive status: Within functional limits for tasks assessed   PALPATION: ***  OBSERVATIONS / OTHER ASSESSMENTS: ***  SENSATION: {sensation:27233}  POSTURE: ***  LE ROM:   {AROM/PROM:27142}  Right 07/07/2023 LEFT 07/07/2023  Hip flexion    Hip extension    Hip abduction    Hip adduction    Hip internal rotation    Hip external  rotation    Knee flexion    Knee extension    Ankle dorsiflexion    Ankle plantarflexion    Ankle inversion    Ankle eversion     (Blank rows = not tested)  LE MMT:   MMT Right 07/07/2023 Left 07/07/2023  Hip flexion    Hip extension    Hip abduction    Hip adduction    Hip internal rotation    Hip external rotation    Knee flexion    Knee extension    Ankle dorsiflexion    Ankle plantarflexion    Ankle inversion    Ankle eversion     (Blank rows = not tested)  LYMPHEDEMA ASSESSMENTS:   SURGERY TYPE/DATE: ***  NUMBER OF LYMPH NODES REMOVED: ***  CHEMOTHERAPY: ***  RADIATION:***  HORMONE TREATMENT: ***  INFECTIONS: ***   LYMPHEDEMA ASSESSMENTS:   LANDMARK RIGHT  07/07/2023  10 cm proximal to olecranon process   Olecranon process   10 cm proximal to ulnar styloid process   Just proximal to ulnar styloid process   Across hand at thumb web space   At base of 2nd digit   (Blank rows = not tested)  LANDMARK LEFT  07/07/2023  10 cm proximal to olecranon process   Olecranon process   10 cm proximal to ulnar styloid process   Just proximal to ulnar styloid process   Across hand at thumb web space   At base of 2nd digit   (Blank rows = not tested)   LE LANDMARK RIGHT 07/07/2023  At groin   30 cm proximal to suprapatella   20 cm proximal to suprapatella   10 cm proximal to suprapatella   At midpatella / popliteal crease   30 cm proximal to floor at lateral plantar foot   20 cm proximal to floor at lateral plantar foot    10 cm proximal to floor at lateral plantar foot   Circumference of ankle/heel   5 cm proximal to 1st MTP joint   Across MTP joint   Around proximal great toe   (Blank rows = not tested)  LE LANDMARK LEFT 07/07/2023  At groin   30 cm proximal to suprapatella   20 cm proximal to suprapatella   10 cm proximal to suprapatella   At midpatella / popliteal crease   30 cm proximal to floor at lateral plantar foot   20 cm proximal to floor at lateral plantar foot   10 cm proximal to floor at lateral plantar foot   Circumference of ankle/heel   5 cm proximal to 1st MTP joint   Across MTP joint   Around proximal great toe   (Blank rows = not tested)  FUNCTIONAL TESTS:  {Functional tests:24029}  GAIT: Distance walked: *** Assistive device utilized: {Assistive devices:23999} Level of assistance: {Levels of assistance:24026} Comments: ***  LYMPHEDEMA LIFE IMPACT SCALE (LLIS):  I. Physical Concerns The amount of pain associated with my lymphedema is: {LLISSELECTION:27229} The amount of limb heaviness associated with my lymphedema is: {LLISSELECTION:27229} The amount of skin tightness associated with my lymphedema is: {LLISSELECTION:27229} In comparison to my unaffected limb, the size of my swollen limb seems: {LLISSELECTION:27229} In comparison to my unaffected limb, the texture of my swollen limb feels: {LLISSELECTION:27229} Lymphedema affects movement of my swollen limb: {LLISSELECTION:27229} The strength in my swollen limb compared with the unaffected limb is: {LLISSELECTION:27229} How often have you become ill wit an infection in your swollen limb requiring oral antibiotics or hospitalization in the  past 2 YEARS? {LLISSELECTION:27229}  II. Psychosocial Concerns 9.   Lymphedema affects my body image (ie. "How I think I look"): {LLISSELECTION:27229} 10. Lymphedema affects my socializing with others: {LLISSELECTION:27229} 11. Lymphedema affects my intimate relations:  {LLISSELECTION:27229} 12. Lymphedema "gets me down" (ie. I have feelings of depression, frustration, or anger due to the lymphedema): {LLISSELECTION:27229}  III. Functional Concerns 13. Lymphedema affects my ability to perform duties at home: {LLISSELECTION:27229} 14. Lymphedema affects my ability to perform dueites at work (if applicable): {LLISSELECTION:27229} 15. Lymphedema affects my performance of preferred recreational activities: {LLISSELECTION:27229} 16. Lymphedema affects the proper fit of clothing/shoes: {LLISSELECTION:27229} 17. Lymphedema affects my sleep: {LLISSELECTION:27229} 18. I must rely on others for help due to my lymphedema: {LLISSELECTION:27229}  TODAY'S TREATMENT:                                                                                                                                         DATE: ***  PATIENT EDUCATION:  Education details: *** Person educated: {Person educated:25204} Education method: {Education Method:25205} Education comprehension: {Education Comprehension:25206}  HOME EXERCISE PROGRAM: ***  ASSESSMENT:  CLINICAL IMPRESSION: Patient is a *** y.o. *** who was seen today for physical therapy evaluation and treatment for ***.   OBJECTIVE IMPAIRMENTS: {opptimpairments:25111}.   ACTIVITY LIMITATIONS: {activitylimitations:27494}  PARTICIPATION LIMITATIONS: {participationrestrictions:25113}  PERSONAL FACTORS: {Personal factors:25162} are also affecting patient's functional outcome.   REHAB POTENTIAL: {rehabpotential:25112}  CLINICAL DECISION MAKING: {clinical decision making:25114}  EVALUATION COMPLEXITY: {Evaluation complexity:25115}   GOALS: Goals reviewed with patient? {yes/no:20286}  SHORT TERM GOALS: Target date: ***  *** Baseline: Goal status: INITIAL  2.  *** Baseline:  Goal status: INITIAL  3.  *** Baseline:  Goal status: INITIAL  4.  *** Baseline:  Goal status: INITIAL  5.  *** Baseline:  Goal status:  INITIAL  6.  *** Baseline:  Goal status: INITIAL  LONG TERM GOALS: Target date: ***  *** Baseline:  Goal status: INITIAL  2.  *** Baseline:  Goal status: INITIAL  3.  *** Baseline:  Goal status: INITIAL  4.  *** Baseline:  Goal status: INITIAL  5.  *** Baseline:  Goal status: INITIAL  6.  *** Baseline:  Goal status: INITIAL   PLAN:  PT FREQUENCY: {rehab frequency:25116}  PT DURATION: {rehab duration:25117}  PLANNED INTERVENTIONS: {rehab planned interventions:25118::"Therapeutic exercises","Therapeutic activity","Neuromuscular re-education","Balance training","Gait training","Patient/Family education","Self Care","Joint mobilization"}  PLAN FOR NEXT SESSION: ***   Loel Dubonnet, MS, OTR/L, CLT-LANA 07/07/23 11:35 AM

## 2023-07-07 NOTE — Assessment & Plan Note (Signed)
 Recheck labs 

## 2023-07-07 NOTE — Progress Notes (Signed)
Name: Jonathon Snow   MRN: [id number]    DOB: 1961/03/05   Date:07/07/2023       Progress Note  Chief Complaint  Patient presents with   Follow-up   Hypertension   Hyperlipidemia     Subjective:   Jonathon Snow is a 62 y.o. male, presents to clinic for routine f/up  He is in severe pain today and mood is not great (now working with [facility])  Hypertension:  Currently managed on norvasc and losartan Pt reports good med compliance and denies any SE.   Blood pressure today is slightly elevated but has been well- controlled in the past - currently distressed in office BP Readings from Last 3 Encounters:  07/07/23 138/84  07/01/23 137/81  04/24/23 132/84    HLD managed with crestor, good compliance no SE or concerns, due for labs in next 2 months, last labs were poorly controlled Lab Results  Component Value Date   CHOL 225 (H) 09/09/2022   HDL 56 09/09/2022   LDLCALC 150 (H) 09/09/2022   TRIG 86 09/09/2022   CHOLHDL 4.0 09/09/2022   Hypercoaguable state/lupus- hematology managing, seeing 1-2 x a year on eliquis  Nephrologist - Lateef managing cellcept and prednisone - he hasn't followed up in a while  Chronic pain gabapentin 300 tramadol and oxy from ortho when he goes and asks for it, hx of spine surgery, chronic back pain that flares, he has previously been referred to pain management but not est with provider, states in the past referred to [facility] clinic - pain management didn't take his insurance  Over the last year he was getting tramadol and oxy from specialists Last rx 2 months ago, having a current back pain flare radiates to left leg, some neuropathy as well on gabapentin 300 gm BID   Pt did recently f/up with nephrology note, plan and labs reviewed: Impression/Recommendations  Jonathon Snow is a 62 y.o. male with with past history of pulmonary embolus, history of IVC filter placement, right ankle fracture status post ORIF, and herniated disc status post L5-S1  fusion, who returns today in followup of lupus nephritis, proteinuria, CKD stage III, hypertension.  Problem List: 1. Lupus Nephritis, underwent induction with cellcept x 1.5 years. Relapse 01/2017, restarted on cellcept. 2. Proteinuria. 3. Hypertension. 4. Positive ANA with positive double-stranded DNA, anti-chromatin antibodies, SSA antibody, and RNP antibody. 5. Systemic lupus erythematosis. 6. Drug induced rash secondary to cytoxan. 7. History of anemia of CKD. 8. CKD stage IIIa with episode of acute renal failure 04/2017 requiring dialysis.  9. History of herpes zoster infection due to immunosuppression. 10. Right axillary and left groin abscess 04/2017. 11. Syncope 10/18/17. 12. Episode of unresponsiveness/respiratory failure/acute renal failure 02/2018 13. L5-S1 spinal fusion 06/17/21.  14. Secondary hyperparathyroidism.  Plan: The patient continues to demonstrate stable chronic kidney disease most recent EGFR 50 with a urine protein creatinine ratio 0.3. His lupus parameters remain stable with double-stranded DNA titer of 6 with normal complements. Therefore we will maintain the patient on current dosage of CellCept 1000 mg twice daily as well as prednisone 10 mg daily. We plan to follow-up renal parameters, urine protein creatinine ratio, C3, C4, and double-stranded ENA titer today. For hypertension he will be maintained on losartan and amlodipine.  Return in about 3 months (around 09/24/2023).  [name], MD     Current Outpatient Medications:    albuterol (VENTOLIN HFA) 108 (90 Base) MCG/ACT inhaler, Inhale 2 puffs into the lungs every 6 (six)  hours as needed for wheezing or shortness of breath., Disp: 18 g, Rfl: 2   amLODipine (NORVASC) 5 MG tablet, Take 1 tablet (5 mg total) by mouth daily., Disp: 90 tablet, Rfl: 1   apixaban (ELIQUIS) 5 MG TABS tablet, Take 1 tablet (5 mg total) by mouth 2 (two) times daily., Disp: 60 tablet, Rfl: 6   ARIPiprazole (ABILIFY) 2 MG tablet, ,  Disp: , Rfl:    cholecalciferol (VITAMIN D3) 25 MCG (1000 UNIT) tablet, Take 1,000 Units by mouth daily., Disp: , Rfl:    ciclopirox (PENLAC) 8 % solution, Apply topically., Disp: , Rfl:    gabapentin (NEURONTIN) 300 MG capsule, Take 1 capsule by mouth 3 (three) times daily., Disp: , Rfl:    levocetirizine (XYZAL) 5 MG tablet, Take 1 tablet (5 mg total) by mouth every evening., Disp: 30 tablet, Rfl: 5   LORazepam (ATIVAN) 0.5 MG tablet, Take 1 tablet (0.5 mg total) by mouth 2 (two) times daily as needed for anxiety., Disp: 20 tablet, Rfl: 0   losartan (COZAAR) 100 MG tablet, Take 1 tablet (100 mg total) by mouth daily., Disp: 90 tablet, Rfl: 1   montelukast (SINGULAIR) 10 MG tablet, Take 1 tablet (10 mg total) by mouth at bedtime., Disp: 90 tablet, Rfl: 3   Multiple Vitamins-Minerals (MULTIVITAMIN WITH MINERALS) tablet, Take 1 tablet by mouth daily., Disp: , Rfl:    mycophenolate (CELLCEPT) 500 MG tablet, Take 1,000 mg by mouth 2 (two) times daily., Disp: , Rfl:    Olopatadine-Mometasone (RYALTRIS) 665-25 MCG/ACT SUSP, Place 2 sprays into the nose 2 (two) times daily as needed., Disp: 29 g, Rfl: 2   ondansetron (ZOFRAN) 4 MG tablet, Take 1 tablet (4 mg total) by mouth every 6 (six) hours as needed for nausea., Disp: 20 tablet, Rfl: 0   oxyCODONE (OXY IR/ROXICODONE) 5 MG immediate release tablet, Take 0.5-1 tablets (2.5-5 mg total) by mouth every 4 (four) hours as needed for moderate pain (pain score 4-6)., Disp: 30 tablet, Rfl: 0   predniSONE (DELTASONE) 10 MG tablet, Take 1 tablet by mouth daily., Disp: , Rfl:    promethazine-dextromethorphan (PROMETHAZINE-DM) 6.25-15 MG/5ML syrup, Take 5 mLs by mouth 4 (four) times daily as needed for cough., Disp: 118 mL, Rfl: 0   rosuvastatin (CRESTOR) 10 MG tablet, Take 1 tablet (10 mg total) by mouth daily., Disp: 90 tablet, Rfl: 3   sertraline (ZOLOFT) 100 MG tablet, Take 1 tablet (100 mg total) by mouth daily., Disp: 30 tablet, Rfl: 3   sertraline (ZOLOFT)  50 MG tablet, Take 1.5 tablets (75 mg total) by mouth daily for 14 days., Disp: 21 tablet, Rfl: 0   sildenafil (REVATIO) 20 MG tablet, TAKE 3 TO 5 TABLETS BY MOUTH AS NEEDED 30 MIN PRIOR TO INTERCOURSE, Disp: 30 tablet, Rfl: 5   traMADol (ULTRAM) 50 MG tablet, Take 1 tablet (50 mg total) by mouth every 6 (six) hours as needed for moderate pain., Disp: 30 tablet, Rfl: 0   meclizine (ANTIVERT) 25 MG tablet, Take 0.5-1 tablets (12.5-25 mg total) by mouth 3 (three) times daily as needed for dizziness. (Patient not taking: Reported on 04/07/2023), Disp: 30 tablet, Rfl: 1  Patient Active Problem List   Diagnosis Date Noted   Hx of colonic polyps 07/01/2023   Adenomatous polyp of colon 07/01/2023   SLE (systemic lupus erythematosus related syndrome) (HCC) 02/19/2023   Closed left hip fracture, initial encounter (HCC) 02/18/2023   Pain and swelling of right lower extremity 02/03/2023   Erectile disorder  01/22/2023   Coronary artery disease of native artery of native heart with stable angina pectoris (HCC) 09/09/2022   Current moderate episode of major depressive disorder (HCC) 09/09/2022   Varicose 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 01/03/2022   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) 09/18/2021   Prediabetes 11/08/2020   Stage 3a chronic kidney disease (HCC) 11/08/2020   Seasonal allergies 11/08/2020   Rhinosinusitis 11/08/2020   Long term current use of systemic steroids 11/08/2020   Anticoagulant disorder (HCC) 11/07/2020   Senile purpura (HCC) 11/07/2020   History of DVT (deep vein thrombosis) 03/22/2020   MDD (major depressive disorder), recurrent episode, mild (HCC) 09/12/2019   Other spondylosis with radiculopathy, lumbar region 02/16/2019   Osteoporosis 10/12/2018   Chronic venous insufficiency 10/07/2018   Lymphedema 10/07/2018   SLE glomerulonephritis syndrome, 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HCC) 03/03/2018   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 of left  proximal lower extremity (HCC) 10/29/2016   Mixed hyperlipidemia 01/15/2016   Primary hypertension 01/15/2016   Obesity (BMI 30.0-34.9) 01/15/2016   Long term current use of anticoagulant 10/04/2015   Systemic lupus erythematosus (HCC) 05/30/2015   COPD, moderate (HCC) 06/27/2014   History of pulmonary embolism 12/11/2013   Nodule of right lung 12/11/2013   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   Cystic disease of liver 08/21/2013    Past Surgical History:  Procedure Laterality Date   ANKLE SURGERY Right    BACK SURGERY     BRONCHIAL WASHINGS N/A 11/01/2021   Procedure: BRONCHIAL WASHINGS;  Surgeon: Vida Rigger, MD;  Location: [facility] ORS;  Service: Thoracic;  Laterality: N/A;   COLONOSCOPY WITH PROPOFOL N/A 05/28/2020   Procedure: COLONOSCOPY WITH PROPOFOL;  Surgeon: Wyline Mood, MD;  Location: [facility] ENDOSCOPY;  Service: Endoscopy;  Laterality: N/A;   COLONOSCOPY WITH PROPOFOL N/A 07/01/2023   Procedure: COLONOSCOPY WITH PROPOFOL;  Surgeon: Wyline Mood, MD;  Location: [facility] ENDOSCOPY;  Service: Gastroenterology;  Laterality: N/A;   CYST EXCISION  92 or 93    Liver cyst removal [facility]   FLEXIBLE BRONCHOSCOPY N/A 11/01/2021   Procedure: FLEXIBLE BRONCHOSCOPY;  Surgeon: Vida Rigger, MD;  Location: [facility] ORS;  Service: Thoracic;  Laterality: N/A;   HIP PINNING,CANNULATED Left 02/19/2023   Procedure: PERCUTANEOUS FIXATION OF FEMORAL NECK;  Surgeon: Signa Kell, MD;  Location: [facility] ORS;  Service: Orthopedics;  Laterality: Left;   I & D EXTREMITY Right 04/29/2017   Procedure: IRRIGATION AND DEBRIDEMENT EXTREMITY;  Surgeon: Ricarda Frame, MD;  Location: [facility] ORS;  Service: General;  Laterality: Right;   IRRIGATION AND DEBRIDEMENT ABSCESS Left 04/29/2017   Procedure: IRRIGATION AND DEBRIDEMENT Scrotal ABSCESS;  Surgeon: Ricarda Frame, MD;  Location: [facility] ORS;  Service: General;  Laterality: Left;   POLYPECTOMY  07/01/2023   Procedure: POLYPECTOMY;  Surgeon: Wyline Mood, MD;   Location: [facility] ENDOSCOPY;  Service: Gastroenterology;;    Family History  Problem Relation Age of Onset   Hypertension Father    Heart disease Father    Clotting disorder Mother    Kidney disease Brother    Heart attack Maternal Grandmother    Heart attack Maternal Grandfather    Heart attack Paternal Grandfather     Social History   Tobacco Use   Smoking status: Never   Smokeless tobacco: Never  Vaping Use   Vaping status: Never Used  Substance Use Topics   Alcohol use: No   Drug use: No    Comment: PT DENIES     Allergies  Allergen Reactions  Enalapril Other (See Comments)    Unknown reaction   Vicodin [Hydrocodone-Acetaminophen] Hives and Rash    Severe headaches (also) NAME BRAND ONLY PER PT CAN TAKE GENERIC    Health Maintenance  Topic Date Due   Zoster Vaccines- Shingrix (1 of 2) 07/07/2023 (Originally 12/11/1980)   COVID-19 Vaccine (6 - 2023-24 season) 07/23/2023 (Originally 08/22/2022)   INFLUENZA VACCINE  07/23/2023   Colonoscopy  06/30/2030   DTaP/Tdap/Td (2 - Td or Tdap) 08/09/2031   Hepatitis C Screening  Completed   HIV Screening  Completed   HPV VACCINES  Aged Out    Chart Review Today: I personally reviewed active problem list, medication list, allergies, family history, social history, health maintenance, notes from last encounter, lab results, imaging with the patient/caregiver today.   Review of Systems  Constitutional: Negative.  Negative for activity change, appetite change, chills, diaphoresis, fatigue and fever.  HENT: Negative.    Eyes: Negative.   Respiratory: Negative.  Negative for chest tightness and shortness of breath.   Cardiovascular: Negative.  Negative for chest pain and palpitations.  Gastrointestinal: Negative.  Negative for abdominal pain.  Endocrine: Negative.   Genitourinary: Negative.   Musculoskeletal:  Positive for arthralgias, back pain, joint swelling and myalgias.  Skin: Negative.   Allergic/Immunologic:  Negative.   Neurological:  Positive for tremors (chronic). Negative for syncope, weakness and light-headedness.  Hematological: Negative.   Psychiatric/Behavioral:  Positive for agitation and dysphoric mood. The patient is nervous/anxious.   All other systems reviewed and are negative.    Objective:   Vitals:   07/07/23 1000  BP: 138/84  Pulse: 98  Resp: 16  Temp: 97.9 F (36.6 C)  TempSrc: Oral  SpO2: 98%  Weight: 237 lb 9.6 oz (107.8 kg)  Height: 6\' 2"  (1.88 m)    Body mass index is 30.51 kg/m.  Physical Exam Vitals and nursing note reviewed.  Constitutional:      General: He is not in acute distress.    Appearance: He is well-developed and well-groomed. He is obese. He is not toxic-appearing or diaphoretic.     Comments: Chronically ill and uncomfortable appearing, fidgeting/grimacing   HENT:     Right Ear: External ear normal.     Left Ear: External ear normal.  Eyes:     General:        Right eye: No discharge.        Left eye: No discharge.     Conjunctiva/sclera: Conjunctivae normal.  Cardiovascular:     Rate and Rhythm: Normal rate and regular rhythm.     Pulses: Normal pulses.     Heart sounds: Normal heart sounds. No murmur heard.    No friction rub. No gallop.  Pulmonary:     Effort: Pulmonary effort is normal. No respiratory distress.     Breath sounds: Normal breath sounds. No stridor. No wheezing or rales.  Abdominal:     General: Bowel sounds are normal.     Palpations: Abdomen is soft.  Musculoskeletal:     Right lower leg: Edema present.     Left lower leg: Edema present.  Skin:    Coloration: Skin is pale.     Findings: Bruising (multiple bruises to bilateral arms and hands) present. No rash.  Neurological:     Mental Status: He is alert. Mental status is at baseline.     Gait: Gait abnormal (antalgic).  Psychiatric:        Attention and Perception: Attention normal.  Mood and Affect: Mood is anxious and depressed.        Behavior:  Behavior is cooperative.         Assessment & Plan:   Problem List Items Addressed This Visit       Cardiovascular and Mediastinum   Primary hypertension - Primary    Bp near goal today while pt was very distressed and in pain, no change to meds Continue losartan 100 mg and norvasc 5 mg daily BP Readings from Last 3 Encounters:  07/07/23 138/84  07/01/23 137/81  04/24/23 132/84         Relevant Orders   COMPLETE METABOLIC PANEL WITH GFR   Senile purpura (HCC)    Multiple areas of bruising to arms/hands        Respiratory   COPD, moderate (HCC)    Pulmonary sx currently stable        Nervous and Auditory   Other spondylosis with radiculopathy, lumbar region    S/p spine surgery, seeing ortho at [facility], needs to get est with physiatry, PM&R or pain management      Relevant Medications   ARIPiprazole (ABILIFY) 2 MG tablet   Chronic bilateral low back pain with left-sided sciatica    Currently flared up with radiation to left leg      Relevant Medications   ARIPiprazole (ABILIFY) 2 MG tablet   Neuropathy    On gabapentin 300 mg BID - we discussed possibly increasing the dose or trying lyrica      Relevant Orders   Drugs of abuse screen w/o alc, rtn urine-sln   CBC with Differential/Platelet   COMPLETE METABOLIC PANEL WITH GFR   TSH   Vitamin B12   Iron, TIBC and Ferritin Panel     Musculoskeletal and Integument   Osteoporosis    Was previously managed by specialists - at high risk with SLE/nephritis, CKD longterm steroid use Will check labs, I do not see that anyone has managed this recently        Genitourinary   SLE glomerulonephritis syndrome, 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HCC)    Per nephrology, reviewed last OV and plan 06/24/2023 seeing every 3 months on cellcept and prednisone      Stage 3a chronic kidney disease (HCC)    Managed by nephrology, proteinuria, HTN, CKD has been stable on losartan and norvasc      Relevant Orders   CBC with  Differential/Platelet   COMPLETE METABOLIC PANEL WITH GFR   VITAMIN D 25 Hydroxy (Vit-D Deficiency, Fractures)   Parathyroid hormone, intact (no Ca)   Iron, TIBC and Ferritin Panel     Hematopoietic and Hemostatic   Anticoagulant disorder (HCC)    On eliquis per hem/onc        Other   Mixed hyperlipidemia    On crestor, last labs LDL poorly controlled, due for recheck of labs      Relevant Orders   COMPLETE METABOLIC PANEL WITH GFR   Lipid panel   Lymphedema    Managed by vascular, wearing compression stockings      Prediabetes    Recheck labs      Relevant Orders   COMPLETE METABOLIC PANEL WITH GFR   Hemoglobin A1c   Long term current use of systemic steroids   Relevant Orders   Drugs of abuse screen w/o alc, rtn urine-sln   CBC with Differential/Platelet   COMPLETE METABOLIC PANEL WITH GFR   Severe episode of recurrent major depressive disorder, without psychotic features (HCC)  He will be managing with [facility] who have kept him on zoloft and added abilify - he notes only some increased aggitation/irritability - encouraged him to let them know about SE and keep his f/up appts      Other Visit Diagnoses     Pain management contract discussed       pt agreed to UDS today, he reports no narcotics since May last Rx, controlled substance database reviewed, pending decisions for pain meds   Relevant Orders   Drugs of abuse screen w/o alc, rtn urine-sln   Encounter for current long-term use of anticoagulants       Relevant Orders   CBC with Differential/Platelet   COMPLETE METABOLIC PANEL WITH GFR   Class 1 obesity with serious comorbidity and body mass index (BMI) of 30.0 to 30.9 in adult, unspecified obesity type       Relevant Orders   CBC with Differential/Platelet   COMPLETE METABOLIC PANEL WITH GFR   Hemoglobin A1c   Lipid panel   TSH   VITAMIN D 25 Hydroxy (Vit-D Deficiency, Fractures)         Would like to get him managed by pain management  specialists, he was referred to physiatry but cost/insurance issues Could refer to [facility] PM&R/pain management It does appear that he has f/up appt soon with [facility] (hip and spine surgery- they have been seeing him regularly) per chart review there appears to be some hx of narcotic abuse/issues?  I will have to review further - I explained that I may be able to give him some pain meds while trying to get him into the specialists but I did state that depends on past hx, UDS, agreeing on a contract etc.  He just had colonoscopy and multiple sticks for IV with several bruised areas/blown veins - he will come back for labs in a few weeks.   Return for needs to complete labs in the next month, 1 month f/up.   Danelle Berry, PA-C 07/07/23 10:11 AM

## 2023-07-07 NOTE — Assessment & Plan Note (Signed)
S/p spine surgery, seeing ortho at [facility], needs to get est with physiatry, PM&R or pain management

## 2023-07-07 NOTE — Assessment & Plan Note (Signed)
Pulmonary sx currently stable

## 2023-07-08 LAB — DRUG MONITOR, PANEL 1, W/CONF, URINE
Amphetamines: NEGATIVE ng/mL (ref <500)
Barbiturates: NEGATIVE ng/mL (ref <300)
Benzodiazepines: NEGATIVE ng/mL (ref <100)
Cocaine Metabolite: NEGATIVE ng/mL (ref <150)
Creatinine: 92 mg/dL (ref >=20.0)
Marijuana Metabolite: NEGATIVE ng/mL (ref <20)
Methadone Metabolite: NEGATIVE ng/mL (ref <100)
Opiates: NEGATIVE ng/mL (ref <100)
Oxidant: NEGATIVE ug/mL (ref <200)
Oxycodone: NEGATIVE ng/mL (ref <100)
Phencyclidine: NEGATIVE ng/mL (ref <25)
pH: 5.6 (ref 4.5–9.0)

## 2023-07-08 LAB — DM TEMPLATE

## 2023-07-09 DIAGNOSIS — F332 Major depressive disorder, recurrent severe without psychotic features: Secondary | ICD-10-CM | POA: Diagnosis not present

## 2023-07-09 DIAGNOSIS — M5136 Other intervertebral disc degeneration, lumbar region: Secondary | ICD-10-CM | POA: Diagnosis not present

## 2023-07-09 DIAGNOSIS — M5416 Radiculopathy, lumbar region: Secondary | ICD-10-CM | POA: Diagnosis not present

## 2023-07-14 ENCOUNTER — Ambulatory Visit: Payer: Medicaid Other | Admitting: Occupational Therapy

## 2023-07-14 ENCOUNTER — Encounter: Payer: Self-pay | Admitting: Occupational Therapy

## 2023-07-14 ENCOUNTER — Ambulatory Visit (INDEPENDENT_AMBULATORY_CARE_PROVIDER_SITE_OTHER): Payer: BLUE CROSS/BLUE SHIELD | Admitting: Vascular Surgery

## 2023-07-14 DIAGNOSIS — I890 Lymphedema, not elsewhere classified: Secondary | ICD-10-CM | POA: Diagnosis not present

## 2023-07-14 NOTE — Therapy (Signed)
OUTPATIENT OCCUPATIONAL THERAPY NOTE  LOWER EXTREMITY LYMPHEDEMA EVALUATION  Patient Name: Jonathon Snow MRN: [id number] DOB:03/01/1961, 62 y.o., male Today's Date: 07/14/2023  END OF SESSION:   OT End of Session - 07/14/23 1214     Visit Number 2    Number of Visits 36    Date for OT Re-Evaluation 10/05/23    OT Start Time 1105    OT Stop Time 1210    OT Time Calculation (min) 65 min    Activity Tolerance Patient tolerated treatment well;No increased pain;Patient limited by pain   Pt moving constantly in seat and pursed lips bgreathing throughout eval session 2/2 reported back pain. 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   Overview:  superior sagittal sinus, left transverse sinus and cortical veins    COVID-19 virus infection 12/2020   Depression    DVT (deep venous thrombosis) (HCC)    Dyspnea    GERD (gastroesophageal reflux disease)    Heel spur, left 02/16/2019   Heel spur, right 02/16/2019   Herpes zoster infection of lumbar region 02/20/2020   History of kidney stones    Hyperlipidemia    Hypertension    Lupus (HCC)    Lymphedema 10/07/2018   Morbid obesity (HCC)    Opiate abuse, episodic (HCC) 02/26/2018   Osteoporosis    Pneumonia    PONV (postoperative nausea and vomiting)    Postphlebitic syndrome with ulcer, 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   Presence of IVC filter 03/22/2020   Removed   Pulmonary embolism (HCC)    Renal disorder    Stage III   Past Surgical History:  Procedure Laterality Date   ANKLE SURGERY Right    BACK SURGERY     BRONCHIAL WASHINGS N/A 11/01/2021   Procedure: BRONCHIAL WASHINGS;  Surgeon: Vida Rigger, MD;  Location: [facility] ORS;  Service: Thoracic;  Laterality: N/A;   COLONOSCOPY WITH PROPOFOL N/A 05/28/2020   Procedure: COLONOSCOPY WITH PROPOFOL;  Surgeon:  Wyline Mood, MD;  Location: [facility] ENDOSCOPY;  Service: Endoscopy;  Laterality: N/A;   COLONOSCOPY WITH PROPOFOL N/A 07/01/2023   Procedure: COLONOSCOPY WITH PROPOFOL;  Surgeon: Wyline Mood, MD;  Location: [facility] ENDOSCOPY;  Service: Gastroenterology;  Laterality: N/A;   CYST EXCISION  92 or 93    Liver cyst removal [facility]   FLEXIBLE BRONCHOSCOPY N/A 11/01/2021   Procedure: FLEXIBLE BRONCHOSCOPY;  Surgeon: Vida Rigger, MD;  Location: [facility] ORS;  Service: Thoracic;  Laterality: N/A;   HIP PINNING,CANNULATED Left 02/19/2023   Procedure: PERCUTANEOUS FIXATION OF FEMORAL NECK;  Surgeon: Signa Kell, MD;  Location: [facility] ORS;  Service: Orthopedics;  Laterality: Left;   I & D EXTREMITY Right 04/29/2017   Procedure: IRRIGATION AND DEBRIDEMENT EXTREMITY;  Surgeon: Ricarda Frame, MD;  Location: [facility] ORS;  Service: General;  Laterality: Right;   IRRIGATION AND DEBRIDEMENT ABSCESS Left 04/29/2017   Procedure: IRRIGATION AND DEBRIDEMENT Scrotal ABSCESS;  Surgeon: Ricarda Frame, MD;  Location: [facility] ORS;  Service: General;  Laterality: Left;   POLYPECTOMY  07/01/2023   Procedure: POLYPECTOMY;  Surgeon: Wyline Mood, MD;  Location: [facility] ENDOSCOPY;  Service: Gastroenterology;;   Patient Active Problem List   Diagnosis Date Noted   Chronic bilateral low back pain with left-sided sciatica 07/07/2023   Severe episode of recurrent major depressive disorder, without psychotic features (HCC)  07/07/2023   Neuropathy 07/07/2023   Hx of colonic polyps 07/01/2023   Adenomatous polyp of colon 07/01/2023   SLE (systemic lupus erythematosus related syndrome) (HCC) 02/19/2023   Closed left hip fracture, initial encounter (HCC) 02/18/2023   Pain and swelling of right lower extremity 02/03/2023   Erectile disorder 01/22/2023   Coronary artery disease of native artery of native heart with stable angina pectoris (HCC) 09/09/2022   Current moderate episode of major depressive disorder (HCC) 09/09/2022   Varicose veins of  left lower extremity with inflammation 01/03/2022   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) 09/18/2021   Prediabetes 11/08/2020   Stage 3a chronic kidney disease (HCC) 11/08/2020   Seasonal allergies 11/08/2020   Rhinosinusitis 11/08/2020   Long term current use of systemic steroids 11/08/2020   Anticoagulant disorder (HCC) 11/07/2020   Senile purpura (HCC) 11/07/2020   History of DVT (deep vein thrombosis) 03/22/2020   MDD (major depressive disorder), recurrent episode, mild (HCC) 09/12/2019   Other spondylosis with radiculopathy, lumbar region 02/16/2019   Osteoporosis 10/12/2018   Chronic venous insufficiency 10/07/2018   Lymphedema 10/07/2018   SLE glomerulonephritis syndrome, 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HCC) 03/03/2018   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) 10/29/2016   Mixed hyperlipidemia 01/15/2016   Primary hypertension 01/15/2016   Obesity (BMI 30.0-34.9) 01/15/2016   Long term current use of anticoagulant 10/04/2015   Systemic lupus erythematosus (HCC) 05/30/2015   COPD, moderate (HCC) 06/27/2014   History of pulmonary embolism 12/11/2013   Nodule of right lung 12/11/2013   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 Pt reports pain level is decreased to 1/10 today. Pt states he is hopeful OT and CDT will give him some relief from chronic pain in his leg and  thigh.  PERTINENT HISTORY: systemic Lupus, HTN, CVI, varicose veins w inflammation, CKD, stage III, Hx DVT, Hx PE, CAD, COPD, L hip Fx 2/24. 2019 IVC filter removed, post phlebitic syndrome w ulcer, ankle Fx-R, back sx, hx scrotal abscess, Major depressive disorder. Medications with swelling  side effect include amlodipine (NORVASC) , Gabapentin and Prednisone.  PAIN:  Are you having pain? Yes: NPRS scale: 7/10 Pain location: L lower extremity- medial thigh and bottom of foot and toes Pain description: burning, tingling ,tight, stabbing, heavy Aggravating factors: walking, standing, extended sitting with dependent positioning Relieving factors: elevation  PRECAUTIONS: Other: LYMPHEDEMA PRECAUTIONS: CARDIAC,  PULMONARY,  WEIGHT BEARING RESTRICTIONS: No  FALLS:  Has patient fallen in last 6 months? Yes. Number of falls 1. Resulted in L hip fracture  LIVING ENVIRONMENT: Lives with:  lives alone Lives in: House/apartment Stairs: No; External: 0 steps; none Has following equipment at home: shower chair  OCCUPATION: unemployed  LEISURE: outdoors person. I like fishing, camping, hiking  HAND DOMINANCE: right   PRIOR LEVEL OF FUNCTION: Independent  PATIENT GOALS: "I'd like to get back to the condition I was in before all this started happening to me."   OBJECTIVE:  FOTO functional outcome measure. Intake score: 45%  Lymphedema Life Impact Scale (Lymphedema life Impact Scale) Intake: 82.35%  (The extent to which lymphedema -related problems impacted your life during the last week.)  COGNITION/ MOOD: Overall cognitive status: Within functional limits for tasks assessed with mild anxiety initially, then more relaxed by end of session  OBSERVATIONS / OTHER ASSESSMENTS: Mild, stage II, BLE lymphedema 2 CVI and suspected, long term lymphatic overload by systemic fluid accumulation   BLE COMPARATIVE LIMB VOLUMETRICS Initial 07/14/23   LANDMARK RIGHT  (dominant)  R LEG (A-D) 3615.6 ml   R THIGH (E-G) 4542.6 ml  R FULL LIMB (A-G) 8163.2 ml  Limb Volume differential (LVD)  %  Volume change since initial %  Volume change overall V  (Blank rows = not tested)  LANDMARK LEFT  (Rx)  L LEG (A-D) 4526.4 ml  L THIGH (E-G) 6246.3 ml  L FULL LIMB (A-G) 10822.7 ml  Limb Volume differential (LVD)  Leg LVD = 19.76%, L>R. Thigh LVD = 27.77%, L>R Full limb LVD = 24.6%, L>R  Volume change since initial %  Volume change overall %  (Blank rows = not tested)   TODAY'S TREATMENT:                                                                                                                                         Initial BLE comparative limb volumetrics Initial multi-layer gradient compression wrap to L leg from base of toes to popliteal fossa Pt edu throughout session re results of volumetrics and comparison between long and short stretch bandage technology for lymphedema  PATIENT EDUCATION:  Education details: Provided Pt/ family education regarding lymphatic structure and function, lymphedema etiology, onset patterns and stages of progression. Taught interaction between blood circulatory system and lymphatic circulation.Discussed  impact of gravity and co-morbidities on lymphatic function. Outlined Complete Decongestive Therapy (CDT)  as standard of care and provided in depth information regarding 4 primary components of Intensive and Self Management  Phases, including Manual Lymph Drainage (MLD), compression wrapping and garments, skin care, and therapeutic exercise. Homero Fellers discussion with re need for frequent attendance and high burden of care when caregiver is needed, impact of co morbidities. We discussed  the chronic, progressive nature of lymphedema and Importance of daily, ongoing LE self-care essential for limiting progression and infection risk. Discussed the demanding nature of CDT, high burden of care between visits and throughout self-management phase,  and importance of high level  of compliance with all home program components for optimal outcome.  Person educated: Patient Education method: Explanation, Demonstration, and Handouts Education comprehension: verbalized understanding, returned demonstration, needs more edu  HOME EXERCISE PROGRAM: At least 2 x daily BLE Lymphatic Pumping There ex Daily BLE/BLQ Skin care to limit infection risk and increase skin excursion Compression Intensive stage compression: multilayer short stretch wraps with gradient techniques. One limb at a time. Length patient dependent. Self-management Phase Compression: Custom, flat knit, BLE compression knee highs. Consider Elvarex classic for daytime and Jobst Relax for HOS  Custom-made gradient compression garments and HOS devices are medically necessary in this case because they are uniquely sized and shaped to fit the exact dimensions of the affected extremities with deformities, and to provide accurate and consistent gradient compression and containment, essential to optimally managing this patient's symptoms of chronic, progressive lymphedema. Multiple custom compression garments are needed for optimal hygiene to limit infection risk. Custom compression garments should be replaced q 3-6 months When worn consistently for optimal lipo-lymphedema self-management over time.  ASSESSMENT: CLINICAL IMPRESSION: BLE limb volumetrics reveal dramatic limb volume differentials for leg, thigh and full LLE. See chart for LVD for each segment. LVD is typically under 4-5 % with dominant limb being larger based on muscle mass. Pt tolerated knee length multilayer wraps applied from base of toes to popliteal fossa. Pt instructed to leave wraps in place for 24 hours if tolerated. Next session we'll start teaching self wrapping. Cont as per POC.    SHORT TERM GOALS: Target date: 4th OT Rx visit   Pt will demonstrate understanding of lymphedema precautions and prevention strategies with modified independence using  a printed reference to identify at least 5 precautions and discussing how s/he may implement them into daily life to reduce risk of progression with extra time. Baseline:Max A Goal status: INITIAL  2.  Pt will be able to apply multilayer, knee length, gradient, compression wraps to one leg at a time with modified assistance (extra time and assistive device/s) to decrease limb volume, to limit infection risk, and to limit lymphedema progression.  Baseline: Dependent Goal status: INITIAL  LONG TERM GOALS: Target date: 10/05/23  Given this patient's Intake score of 45 % on the functional outcomes FOTO tool, patient will experience an increase in function of 3 points to improve basic and instrumental ADLs performance, including lymphedema self-care.  Baseline: Max A Goal status: INITIAL  2.  Given this patient's Intake score of 82.35% on the Lymphedema Life Impact Scale (LLIS), patient will experience a reduction of at least 5 points in her perceived level of functional impairment resulting from lymphedema to improve functional performance and quality of life (QOL). Baseline: 64.71% Goal status: INITIAL  3.  Pt will achieve at least a 10% volume reduction in LLE to return limb to typical size and shape, to limit infection risk and LE progression, to decrease pain, to improve function. Baseline: Dependent Goal status: INITIAL  4.  Pt will obtain proper compression garments/devices and achieve modified independence (extra  time + assistive devices) with donning/doffing to optimize limb volume reductions and limit LE  progression over time. Baseline:  Goal status: INITIAL  5.  During Intensive phase CDT , with modified independence, Pt will achieve at least 85% compliance with all lymphedema self-care home program components, including daily skin care, compression wraps and /or garments, simple self MLD and lymphatic pumping therex to habituate LE self care protocol  into ADLs for optimal LE  self-management over time. Baseline: Dependent Goal status: INITIAL   OBJECTIVE IMPAIRMENTS: Abnormal gait, cardiopulmonary status limiting activity, decreased activity tolerance, decreased endurance, decreased knowledge of condition, decreased knowledge of use of DME, decreased mobility, and difficulty walking.   ACTIVITY LIMITATIONS: carrying, lifting, bending, sitting, standing, squatting, sleeping, stairs, transfers, bathing, dressing, reach over head, hygiene/grooming, and functional ambulation and mobility.  PARTICIPATION LIMITATIONS: meal prep, cleaning, laundry, interpersonal relationship, driving, shopping, community activity, occupation, and yard work  PERSONAL FACTORS: Behavior pattern, Past/current experiences, Time since onset of injury/illness/exacerbation, and 3+ co morbidities: also affect patient's functional outcome.   REHAB POTENTIAL: Good  EVALUATION COMPLEXITY: Moderate  PLAN: OT FREQUENCY: 2x/week  OT DURATION: 12 weeks and PRN  PLANNED INTERVENTIONS: Therapeutic exercises, Therapeutic activity, Patient/Family education, Self Care, DME instructions, Manual lymph drainage, Compression bandaging, and Manual therapy, customer compression garment and device fitting.  PLAN FOR NEXT SESSION:  Commence teaching self bandaging Teach lymphatic pumping therex    Loel Dubonnet, MS, OTR/L, CLT-LANA 07/14/23 12:16 PM

## 2023-07-16 ENCOUNTER — Ambulatory Visit: Payer: Medicaid Other | Admitting: Podiatry

## 2023-07-16 DIAGNOSIS — F332 Major depressive disorder, recurrent severe without psychotic features: Secondary | ICD-10-CM | POA: Diagnosis not present

## 2023-07-17 ENCOUNTER — Encounter: Payer: Self-pay | Admitting: Occupational Therapy

## 2023-07-17 ENCOUNTER — Ambulatory Visit: Payer: Medicaid Other | Admitting: Occupational Therapy

## 2023-07-17 DIAGNOSIS — I890 Lymphedema, not elsewhere classified: Secondary | ICD-10-CM | POA: Diagnosis not present

## 2023-07-17 NOTE — Therapy (Signed)
OUTPATIENT OCCUPATIONAL THERAPY NOTE  LOWER EXTREMITY LYMPHEDEMA EVALUATION  Patient Name: Jonathon Snow MRN: [id number] DOB:1961/08/29, 62 y.o., male Today's Date: 07/17/2023  END OF SESSION:   OT End of Session - 07/17/23 1009     Visit Number 3    Number of Visits 36    Date for OT Re-Evaluation 10/05/23    OT Start Time 1000    OT Stop Time 1055    OT Time Calculation (min) 55 min    Activity Tolerance Patient tolerated treatment well;No increased pain;Patient limited by pain   Pt moving constantly in seat and pursed lips bgreathing throughout eval session 2/2 reported back pain. 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   Overview:  superior sagittal sinus, left transverse sinus and cortical veins    COVID-19 virus infection 12/2020   Depression    DVT (deep venous thrombosis) (HCC)    Dyspnea    GERD (gastroesophageal reflux disease)    Heel spur, left 02/16/2019   Heel spur, right 02/16/2019   Herpes zoster infection of lumbar region 02/20/2020   History of kidney stones    Hyperlipidemia    Hypertension    Lupus (HCC)    Lymphedema 10/07/2018   Morbid obesity (HCC)    Opiate abuse, episodic (HCC) 02/26/2018   Osteoporosis    Pneumonia    PONV (postoperative nausea and vomiting)    Postphlebitic syndrome with ulcer, 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   Presence of IVC filter 03/22/2020   Removed   Pulmonary embolism (HCC)    Renal disorder    Stage III   Past Surgical History:  Procedure Laterality Date   ANKLE SURGERY Right    BACK SURGERY     BRONCHIAL WASHINGS N/A 11/01/2021   Procedure: BRONCHIAL WASHINGS;  Surgeon: Vida Rigger, MD;  Location: [facility] ORS;  Service: Thoracic;  Laterality: N/A;   COLONOSCOPY WITH PROPOFOL N/A 05/28/2020   Procedure: COLONOSCOPY WITH PROPOFOL;  Surgeon:  Wyline Mood, MD;  Location: [facility] ENDOSCOPY;  Service: Endoscopy;  Laterality: N/A;   COLONOSCOPY WITH PROPOFOL N/A 07/01/2023   Procedure: COLONOSCOPY WITH PROPOFOL;  Surgeon: Wyline Mood, MD;  Location: [facility] ENDOSCOPY;  Service: Gastroenterology;  Laterality: N/A;   CYST EXCISION  92 or 93    Liver cyst removal [facility]   FLEXIBLE BRONCHOSCOPY N/A 11/01/2021   Procedure: FLEXIBLE BRONCHOSCOPY;  Surgeon: Vida Rigger, MD;  Location: [facility] ORS;  Service: Thoracic;  Laterality: N/A;   HIP PINNING,CANNULATED Left 02/19/2023   Procedure: PERCUTANEOUS FIXATION OF FEMORAL NECK;  Surgeon: Signa Kell, MD;  Location: [facility] ORS;  Service: Orthopedics;  Laterality: Left;   I & D EXTREMITY Right 04/29/2017   Procedure: IRRIGATION AND DEBRIDEMENT EXTREMITY;  Surgeon: Ricarda Frame, MD;  Location: [facility] ORS;  Service: General;  Laterality: Right;   IRRIGATION AND DEBRIDEMENT ABSCESS Left 04/29/2017   Procedure: IRRIGATION AND DEBRIDEMENT Scrotal ABSCESS;  Surgeon: Ricarda Frame, MD;  Location: [facility] ORS;  Service: General;  Laterality: Left;   POLYPECTOMY  07/01/2023   Procedure: POLYPECTOMY;  Surgeon: Wyline Mood, MD;  Location: [facility] ENDOSCOPY;  Service: Gastroenterology;;   Patient Active Problem List   Diagnosis Date Noted   Chronic bilateral low back pain with left-sided sciatica 07/07/2023   Severe episode of recurrent major depressive disorder, without psychotic features (HCC)  07/07/2023   Neuropathy 07/07/2023   Hx of colonic polyps 07/01/2023   Adenomatous polyp of colon 07/01/2023   SLE (systemic lupus erythematosus related syndrome) (HCC) 02/19/2023   Closed left hip fracture, initial encounter (HCC) 02/18/2023   Pain and swelling of right lower extremity 02/03/2023   Erectile disorder 01/22/2023   Coronary artery disease of native artery of native heart with stable angina pectoris (HCC) 09/09/2022   Current moderate episode of major depressive disorder (HCC) 09/09/2022   Varicose veins of  left lower extremity with inflammation 01/03/2022   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) 09/18/2021   Prediabetes 11/08/2020   Stage 3a chronic kidney disease (HCC) 11/08/2020   Seasonal allergies 11/08/2020   Rhinosinusitis 11/08/2020   Long term current use of systemic steroids 11/08/2020   Anticoagulant disorder (HCC) 11/07/2020   Senile purpura (HCC) 11/07/2020   History of DVT (deep vein thrombosis) 03/22/2020   MDD (major depressive disorder), recurrent episode, mild (HCC) 09/12/2019   Other spondylosis with radiculopathy, lumbar region 02/16/2019   Osteoporosis 10/12/2018   Chronic venous insufficiency 10/07/2018   Lymphedema 10/07/2018   SLE glomerulonephritis syndrome, 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HCC) 03/03/2018   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) 10/29/2016   Mixed hyperlipidemia 01/15/2016   Primary hypertension 01/15/2016   Obesity (BMI 30.0-34.9) 01/15/2016   Long term current use of anticoagulant 10/04/2015   Systemic lupus erythematosus (HCC) 05/30/2015   COPD, moderate (HCC) 06/27/2014   History of pulmonary embolism 12/11/2013   Nodule of right lung 12/11/2013   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 Pt reports pain level in his back  5/10 today. Pt states he is hopeful OT and CDT will give him some relief from chronic pain in his leg and thigh.  Pt reports he tolerated compression wrapping without difficulty during the visit interval.  PERTINENT HISTORY: systemic Lupus, HTN, CVI, varicose veins w inflammation, CKD, stage III, Hx DVT, Hx PE, CAD, COPD, L hip Fx 2/24. 2019 IVC filter removed, post phlebitic syndrome w ulcer,  ankle Fx-R, back sx, hx scrotal abscess, Major depressive disorder. Medications with swelling side effect include amlodipine (NORVASC) , Gabapentin and Prednisone.  PAIN:  Are you having pain? Yes: NPRS scale: 5/10 Pain location: L lower extremity- medial thigh and bottom of foot and toes, back Pain description: burning, tingling ,tight, stabbing, heavy Aggravating factors: walking, standing, extended sitting with dependent positioning Relieving factors: elevation  PRECAUTIONS: Other: LYMPHEDEMA PRECAUTIONS: CARDIAC,  PULMONARY,  WEIGHT BEARING RESTRICTIONS: No  FALLS:  Has patient fallen in last 6 months? Yes. Number of falls 1. Resulted in L hip fracture  LIVING ENVIRONMENT: Lives with:  lives alone Lives in: House/apartment Stairs: No; External: 0 steps; none Has following equipment at home: shower chair  OCCUPATION: unemployed  LEISURE: outdoors person. I like fishing, camping, hiking  HAND DOMINANCE: right   PRIOR LEVEL OF FUNCTION: Independent  PATIENT GOALS: "I'd like to get back to the condition I was in before all this started happening to me."   OBJECTIVE:  FOTO functional outcome measure. Intake score: 45%  Lymphedema Life Impact Scale (Lymphedema life Impact Scale) Intake: 82.35%  (The extent to which lymphedema -related problems impacted your life during the last week.)  COGNITION/ MOOD: Overall cognitive status: Within functional limits for tasks assessed with mild anxiety initially, then more relaxed by end of session  OBSERVATIONS / OTHER ASSESSMENTS: Mild, stage II, BLE lymphedema 2 CVI and suspected, long term lymphatic overload by systemic fluid accumulation   BLE  COMPARATIVE LIMB VOLUMETRICS Initial 07/14/23   LANDMARK RIGHT  (dominant)  R LEG (A-D) 3615.6 ml  R THIGH (E-G) 4542.6 ml  R FULL LIMB (A-G) 8163.2 ml  Limb Volume differential (LVD)  %  Volume change since initial %  Volume change overall V  (Blank rows = not tested)  LANDMARK LEFT  (Rx)  L LEG (A-D) 4526.4 ml  L THIGH (E-G) 6246.3 ml  L FULL LIMB (A-G) 10822.7 ml  Limb Volume differential (LVD)  Leg LVD = 19.76%, L>R. Thigh LVD = 27.77%, L>R Full limb LVD = 24.6%, L>R  Volume change since initial %  Volume change overall %  (Blank rows = not tested)   TODAY'S TREATMENT:                                                                                                                                         Thigh length, multi-layer gradient compression wrap to L leg from base of toes to groin using 1 each 8, 10 and 12 cm wide short stretch bandages over single layer of Rosidal foam on the leg in circumferential pattern, and 2 additional 12 cm wide bandages over Rosidal  to groin.   Pt edu throughout session for self wrapping  PATIENT EDUCATION:  Education details: Provided Pt/ family education regarding lymphatic structure and function, lymphedema etiology, onset patterns and stages of progression. Taught interaction between blood circulatory system and  lymphatic circulation.Discussed  impact of gravity and co-morbidities on lymphatic function. Outlined Complete Decongestive Therapy (CDT)  as standard of care and provided in depth information regarding 4 primary components of Intensive and Self Management Phases, including Manual Lymph Drainage (MLD), compression wrapping and garments, skin care, and therapeutic exercise. Homero Fellers discussion with re need for frequent attendance and high burden of care when caregiver is needed, impact of co morbidities. We discussed  the chronic, progressive nature of lymphedema and Importance of daily, ongoing LE self-care essential for limiting progression  and infection risk. Discussed the demanding nature of CDT, high burden of care between visits and throughout self-management phase,  and importance of high level of compliance with all home program components for optimal outcome.  Person educated: Patient Education method: Explanation, Demonstration, and Handouts Education comprehension: verbalized understanding, returned demonstration, needs more edu  HOME EXERCISE PROGRAM: At least 2 x daily BLE Lymphatic Pumping There ex Daily BLE/BLQ Skin care to limit infection risk and increase skin excursion Compression Intensive stage compression: multilayer short stretch wraps with gradient techniques. One limb at a time. Length patient dependent. Self-management Phase Compression: Custom, flat knit, BLE compression knee highs. Consider Elvarex classic for daytime and Jobst Relax for HOS  Custom-made gradient compression garments and HOS devices are medically necessary in this case because they are uniquely sized and shaped to fit the exact dimensions of the affected extremities with deformities, and to provide accurate and consistent gradient compression and containment, essential to optimally managing this patient's symptoms of chronic, progressive lymphedema. Multiple custom compression garments are needed for optimal hygiene to limit infection risk. Custom compression garments should be replaced q 3-6 months When worn consistently for optimal lipo-lymphedema self-management over time.  ASSESSMENT: CLINICAL IMPRESSION: After skilled teaching, including opportunities to practice, verbal and physical cues, Pt is able to apply multilayer  compression wraps from toes to groin using correct gradient technique. Goal Met. Cont as per POC.  SHORT TERM GOALS: Target date: 4th OT Rx visit   Pt will demonstrate understanding of lymphedema precautions and prevention strategies with modified independence using a printed reference to identify at least 5 precautions  and discussing how s/he may implement them into daily life to reduce risk of progression with extra time. Baseline:Max A Goal status: INITIAL  2.  Pt will be able to apply multilayer, knee length, gradient, compression wraps to one leg at a time with modified assistance (extra time and assistive device/s) to decrease limb volume, to limit infection risk, and to limit lymphedema progression.  Baseline: Dependent Goal status: 07/17/23  GOAL MET  LONG TERM GOALS: Target date: 10/05/23  Given this patient's Intake score of 45 % on the functional outcomes FOTO tool, patient will experience an increase in function of 3 points to improve basic and instrumental ADLs performance, including lymphedema self-care.  Baseline: Max A Goal status: INITIAL  2.  Given this patient's Intake score of 82.35% on the Lymphedema Life Impact Scale (LLIS), patient will experience a reduction of at least 5 points in her perceived level of functional impairment resulting from lymphedema to improve functional performance and quality of life (QOL). Baseline: 64.71% Goal status: INITIAL  3.  Pt will achieve at least a 10% volume reduction in LLE to return limb to typical size and shape, to limit infection risk and LE progression, to decrease pain, to improve function. Baseline: Dependent Goal status: INITIAL  4.  Pt will obtain proper compression garments/devices and achieve modified independence (extra time + assistive  devices) with donning/doffing to optimize limb volume reductions and limit LE  progression over time. Baseline:  Goal status: INITIAL  5.  During Intensive phase CDT , with modified independence, Pt will achieve at least 85% compliance with all lymphedema self-care home program components, including daily skin care, compression wraps and /or garments, simple self MLD and lymphatic pumping therex to habituate LE self care protocol  into ADLs for optimal LE self-management over time. Baseline:  Dependent Goal status: INITIAL   OBJECTIVE IMPAIRMENTS: Abnormal gait, cardiopulmonary status limiting activity, decreased activity tolerance, decreased endurance, decreased knowledge of condition, decreased knowledge of use of DME, decreased mobility, and difficulty walking.   ACTIVITY LIMITATIONS: carrying, lifting, bending, sitting, standing, squatting, sleeping, stairs, transfers, bathing, dressing, reach over head, hygiene/grooming, and functional ambulation and mobility.  PARTICIPATION LIMITATIONS: meal prep, cleaning, laundry, interpersonal relationship, driving, shopping, community activity, occupation, and yard work  PERSONAL FACTORS: Behavior pattern, Past/current experiences, Time since onset of injury/illness/exacerbation, and 3+ co morbidities: also affect patient's functional outcome.   REHAB POTENTIAL: Good  EVALUATION COMPLEXITY: Moderate  PLAN: OT FREQUENCY: 2x/week  OT DURATION: 12 weeks and PRN  PLANNED INTERVENTIONS: Therapeutic exercises, Therapeutic activity, Patient/Family education, Self Care, DME instructions, Manual lymph drainage, Compression bandaging, and Manual therapy, customer compression garment and device fitting.  PLAN FOR NEXT SESSION:  Commence teaching self bandaging Teach lymphatic pumping therex    Loel Dubonnet, MS, OTR/L, CLT-LANA 07/17/23 10:58 AM

## 2023-07-20 ENCOUNTER — Ambulatory Visit
Admission: RE | Admit: 2023-07-20 | Discharge: 2023-07-20 | Disposition: A | Discharge status: Home / Self Care | Payer: Medicaid Other | Source: Ambulatory Visit | Attending: Family Medicine | Admitting: Family Medicine

## 2023-07-20 DIAGNOSIS — M810 Age-related osteoporosis without current pathological fracture: Secondary | ICD-10-CM | POA: Diagnosis not present

## 2023-07-20 DIAGNOSIS — Z79899 Other long term (current) drug therapy: Secondary | ICD-10-CM | POA: Diagnosis not present

## 2023-07-21 ENCOUNTER — Encounter: Payer: Self-pay | Admitting: Occupational Therapy

## 2023-07-21 ENCOUNTER — Ambulatory Visit: Payer: Medicaid Other | Admitting: Occupational Therapy

## 2023-07-21 DIAGNOSIS — M5416 Radiculopathy, lumbar region: Secondary | ICD-10-CM | POA: Diagnosis not present

## 2023-07-21 DIAGNOSIS — I890 Lymphedema, not elsewhere classified: Secondary | ICD-10-CM

## 2023-07-21 DIAGNOSIS — Z981 Arthrodesis status: Secondary | ICD-10-CM | POA: Diagnosis not present

## 2023-07-21 NOTE — Therapy (Signed)
OUTPATIENT OCCUPATIONAL THERAPY NOTE  LOWER EXTREMITY LYMPHEDEMA  Patient Name: Jonathon Snow MRN: [id number] DOB:[birth year], 62 y.o., male Today's Date: 07/21/2023  END OF SESSION:   OT End of Session - 07/21/23 1259     Visit Number 4    Number of Visits 36    Date for OT Re-Evaluation 10/05/23    OT Start Time 1100    OT Stop Time 1200    OT Time Calculation (min) 60 min    Activity Tolerance Patient tolerated treatment well;No increased pain;Patient limited by pain   Pt moving constantly in seat and pursed lips bgreathing throughout eval session 2/2 reported back pain. 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   Overview:  superior sagittal sinus, left transverse sinus and cortical veins    COVID-19 virus infection 12/2020   Depression    DVT (deep venous thrombosis) (HCC)    Dyspnea    GERD (gastroesophageal reflux disease)    Heel spur, left 02/16/2019   Heel spur, right 02/16/2019   Herpes zoster infection of lumbar region 02/20/2020   History of kidney stones    Hyperlipidemia    Hypertension    Lupus (HCC)    Lymphedema 10/07/2018   Morbid obesity (HCC)    Opiate abuse, episodic (HCC) 02/26/2018   Osteoporosis    Pneumonia    PONV (postoperative nausea and vomiting)    Postphlebitic syndrome with ulcer, 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   Presence of IVC filter 03/22/2020   Removed   Pulmonary embolism (HCC)    Renal disorder    Stage III   Past Surgical History:  Procedure Laterality Date   ANKLE SURGERY Right    BACK SURGERY     BRONCHIAL WASHINGS N/A 11/01/2021   Procedure: BRONCHIAL WASHINGS;  Surgeon: Vida Rigger, MD;  Location: [facility] ORS;  Service: Thoracic;  Laterality: N/A;   COLONOSCOPY WITH PROPOFOL N/A 05/28/2020   Procedure: COLONOSCOPY WITH PROPOFOL;  Surgeon: Wyline Mood,  MD;  Location: [facility] ENDOSCOPY;  Service: Endoscopy;  Laterality: N/A;   COLONOSCOPY WITH PROPOFOL N/A 07/01/2023   Procedure: COLONOSCOPY WITH PROPOFOL;  Surgeon: Wyline Mood, MD;  Location: [facility] ENDOSCOPY;  Service: Gastroenterology;  Laterality: N/A;   CYST EXCISION  92 or 93    Liver cyst removal [facility]   FLEXIBLE BRONCHOSCOPY N/A 11/01/2021   Procedure: FLEXIBLE BRONCHOSCOPY;  Surgeon: Vida Rigger, MD;  Location: [facility] ORS;  Service: Thoracic;  Laterality: N/A;   HIP PINNING,CANNULATED Left 02/19/2023   Procedure: PERCUTANEOUS FIXATION OF FEMORAL NECK;  Surgeon: Signa Kell, MD;  Location: [facility] ORS;  Service: Orthopedics;  Laterality: Left;   I & D EXTREMITY Right 04/29/2017   Procedure: IRRIGATION AND DEBRIDEMENT EXTREMITY;  Surgeon: Ricarda Frame, MD;  Location: [facility] ORS;  Service: General;  Laterality: Right;   IRRIGATION AND DEBRIDEMENT ABSCESS Left 04/29/2017   Procedure: IRRIGATION AND DEBRIDEMENT Scrotal ABSCESS;  Surgeon: Ricarda Frame, MD;  Location: [facility] ORS;  Service: General;  Laterality: Left;   POLYPECTOMY  07/01/2023   Procedure: POLYPECTOMY;  Surgeon: Wyline Mood, MD;  Location: [facility] ENDOSCOPY;  Service: Gastroenterology;;   Patient Active Problem List   Diagnosis Date Noted   Chronic bilateral low back pain with left-sided sciatica 07/07/2023   Severe episode of recurrent major depressive disorder, 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  Neuropathy 07/07/2023   Hx of colonic polyps 07/01/2023   Adenomatous polyp of colon 07/01/2023   SLE (systemic lupus erythematosus related syndrome) (HCC) 02/19/2023   Closed left hip fracture, initial encounter (HCC) 02/18/2023   Pain and swelling of right lower extremity 02/03/2023   Erectile disorder 01/22/2023   Coronary artery disease of native artery of native heart with stable angina pectoris (HCC) 09/09/2022   Current moderate episode of major depressive disorder (HCC) 09/09/2022   Varicose veins of left lower  extremity with inflammation 01/03/2022   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) 09/18/2021   Prediabetes 11/08/2020   Stage 3a chronic kidney disease (HCC) 11/08/2020   Seasonal allergies 11/08/2020   Rhinosinusitis 11/08/2020   Long term current use of systemic steroids 11/08/2020   Anticoagulant disorder (HCC) 11/07/2020   Senile purpura (HCC) 11/07/2020   History of DVT (deep vein thrombosis) 03/22/2020   MDD (major depressive disorder), recurrent episode, mild (HCC) 09/12/2019   Other spondylosis with radiculopathy, lumbar region 02/16/2019   Osteoporosis 10/12/2018   Chronic venous insufficiency 10/07/2018   Lymphedema 10/07/2018   SLE glomerulonephritis syndrome, 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HCC) 03/03/2018   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) 10/29/2016   Mixed hyperlipidemia 01/15/2016   Primary hypertension 01/15/2016   Obesity (BMI 30.0-34.9) 01/15/2016   Long term current use of anticoagulant 10/04/2015   Systemic lupus erythematosus (HCC) 05/30/2015   COPD, moderate (HCC) 06/27/2014   History of pulmonary embolism 12/11/2013   Nodule of right lung 12/11/2013   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 Pt reports pain is unchanged from last visit. Pt reports he tolerated compression wrapping without difficulty during the visit interval. He was able to apply  wraps at home x 2 since last session. He did not bring wraps to clinic.  PERTINENT HISTORY: systemic Lupus, HTN, CVI, varicose veins w inflammation, CKD, stage III, Hx DVT, Hx PE, CAD, COPD, L hip Fx 2/24. 2019 IVC filter removed, post phlebitic syndrome w ulcer, ankle Fx-R, back sx, hx scrotal  abscess, Major depressive disorder. Medications with swelling side effect include amlodipine (NORVASC) , Gabapentin and Prednisone.  PAIN:  Are you having pain? Yes: NPRS scale: 5/10 Pain location: L lower extremity- medial thigh and bottom of foot and toes, back Pain description: burning, tingling ,tight, stabbing, heavy Aggravating factors: walking, standing, extended sitting with dependent positioning Relieving factors: elevation  PRECAUTIONS: Other: LYMPHEDEMA PRECAUTIONS: CARDIAC,  PULMONARY,  WEIGHT BEARING RESTRICTIONS: No  FALLS:  Has patient fallen in last 6 months? Yes. Number of falls 1. Resulted in L hip fracture  LIVING ENVIRONMENT: Lives with:  lives alone Lives in: House/apartment Stairs: No; External: 0 steps; none Has following equipment at home: shower chair  OCCUPATION: unemployed  LEISURE: outdoors person. I like fishing, camping, hiking  HAND DOMINANCE: right   PRIOR LEVEL OF FUNCTION: Independent  PATIENT GOALS: "I'd like to get back to the condition I was in before all this started happening to me."   OBJECTIVE:  FOTO functional outcome measure. Intake score: 45%  Lymphedema Life Impact Scale (Lymphedema life Impact Scale) Intake: 82.35%  (The extent to which lymphedema -related problems impacted your life during the last week.)  COGNITION/ MOOD: Overall cognitive status: Within functional limits for tasks assessed with mild anxiety initially, then more relaxed by end of session  OBSERVATIONS / OTHER ASSESSMENTS: Mild, stage II, BLE lymphedema 2 CVI and suspected, long term lymphatic overload by systemic fluid accumulation   BLE COMPARATIVE LIMB  VOLUMETRICS Initial 07/14/23   LANDMARK RIGHT  (dominant)  R LEG (A-D) 3615.6 ml  R THIGH (E-G) 4542.6 ml  R FULL LIMB (A-G) 8163.2 ml  Limb Volume differential (LVD)  %  Volume change since initial %  Volume change overall V  (Blank rows = not tested)  LANDMARK LEFT  (Rx)  L LEG (A-D) 4526.4 ml  L THIGH (E-G) 6246.3 ml  L FULL LIMB (A-G) 10822.7 ml  Limb Volume differential (LVD)  Leg LVD = 19.76%, L>R. Thigh LVD = 27.77%, L>R Full limb LVD = 24.6%, L>R  Volume change since initial %  Volume change overall %  (Blank rows = not tested)   TODAY'S TREATMENT:                                                                                                                                         LLE  does not appear reduced in volume since last     visit by gross visual assessment and palpation. Commenced MLD today using functional inguinal LN, terminus and deep lymphatics with diaphragmatic breathing. Pt tolerated well. Provided skilled teaching throughout session for lymphatic map and structure and function of lymphatic system in relation to MLD. Pt will apply wraps when he gets home since he did not bring them, to clinic this morning. Cont as per POC.  PATIENT EDUCATION:  Education details: Provided Pt/ family education regarding lymphatic structure and function, lymphedema etiology,  onset patterns and stages of progression. Taught interaction between blood circulatory system and lymphatic circulation.Discussed  impact of gravity and co-morbidities on lymphatic function. Outlined Complete Decongestive Therapy (CDT)  as standard of care and provided in depth information regarding 4 primary components of Intensive and Self Management Phases, including Manual Lymph Drainage (MLD), compression wrapping and garments, skin care, and therapeutic exercise. Homero Fellers discussion with re need for frequent attendance and high burden of care when caregiver is needed, impact of co morbidities. We discussed  the  chronic, progressive nature of lymphedema and Importance of daily, ongoing LE self-care essential for limiting progression and infection risk. Discussed the demanding nature of CDT, high burden of care between visits and throughout self-management phase,  and importance of high level of compliance with all home program components for optimal outcome.  Person educated: Patient Education method: Explanation, Demonstration, and Handouts Education comprehension: verbalized understanding, returned demonstration, needs more edu  HOME EXERCISE PROGRAM: At least 2 x daily BLE Lymphatic Pumping There ex Daily BLE/BLQ Skin care to limit infection risk and increase skin excursion Compression Intensive stage compression: multilayer short stretch wraps with gradient techniques. One limb at a time. Thigh length, multi-layer gradient compression wrap to L leg from base of toes to groin using 1 each 8, 10 and 12 cm wide short stretch bandages over single layer of Rosidal foam on the leg in circumferential pattern, and 2 additional 12 cm wide bandages over Rosidal  to groin.  Self-management Phase Compression: Custom, flat knit, BLE compression knee highs. Consider Elvarex classic for daytime and Jobst Relax for HOS  Custom-made gradient compression garments and HOS devices are medically necessary in this case because they are uniquely sized and shaped to fit the exact dimensions of the affected extremities with deformities, and to provide accurate and consistent gradient compression and containment, essential to optimally managing this patient's symptoms of chronic, progressive lymphedema. Multiple custom compression garments are needed for optimal hygiene to limit infection risk. Custom compression garments should be replaced q 3-6 months When worn consistently for optimal lipo-lymphedema self-management over time.  ASSESSMENT: CLINICAL IMPRESSION: After skilled teaching, including opportunities to practice, verbal  and physical cues, Pt is able to apply multilayer  compression wraps from toes to groin using correct gradient technique. Goal Met. Cont as per POC.  SHORT TERM GOALS: Target date: 4th OT Rx visit   Pt will demonstrate understanding of lymphedema precautions and prevention strategies with modified independence using a printed reference to identify at least 5 precautions and discussing how s/he may implement them into daily life to reduce risk of progression with extra time. Baseline:Max A Goal status: INITIAL  2.  Pt will be able to apply multilayer, knee length, gradient, compression wraps to one leg at a time with modified assistance (extra time and assistive device/s) to decrease limb volume, to limit infection risk, and to limit lymphedema progression.  Baseline: Dependent Goal status: 07/17/23  GOAL MET  LONG TERM GOALS: Target date: 10/05/23  Given this patient's Intake score of 45 % on the functional outcomes FOTO tool, patient will experience an increase in function of 3 points to improve basic and instrumental ADLs performance, including lymphedema self-care.  Baseline: Max A Goal status: INITIAL  2.  Given this patient's Intake score of 82.35% on the Lymphedema Life Impact Scale (LLIS), patient will experience a reduction of at least 5 points in her perceived level of functional impairment resulting from lymphedema to improve functional performance and quality of life (QOL).  Baseline: 64.71% Goal status: INITIAL  3.  Pt will achieve at least a 10% volume reduction in LLE to return limb to typical size and shape, to limit infection risk and LE progression, to decrease pain, to improve function. Baseline: Dependent Goal status: INITIAL  4.  Pt will obtain proper compression garments/devices and achieve modified independence (extra time + assistive devices) with donning/doffing to optimize limb volume reductions and limit LE  progression over time. Baseline:  Goal status:  INITIAL  5.  During Intensive phase CDT , with modified independence, Pt will achieve at least 85% compliance with all lymphedema self-care home program components, including daily skin care, compression wraps and /or garments, simple self MLD and lymphatic pumping therex to habituate LE self care protocol  into ADLs for optimal LE self-management over time. Baseline: Dependent Goal status: INITIAL   OBJECTIVE IMPAIRMENTS: Abnormal gait, cardiopulmonary status limiting activity, decreased activity tolerance, decreased endurance, decreased knowledge of condition, decreased knowledge of use of DME, decreased mobility, and difficulty walking.   ACTIVITY LIMITATIONS: carrying, lifting, bending, sitting, standing, squatting, sleeping, stairs, transfers, bathing, dressing, reach over head, hygiene/grooming, and functional ambulation and mobility.  PARTICIPATION LIMITATIONS: meal prep, cleaning, laundry, interpersonal relationship, driving, shopping, community activity, occupation, and yard work  PERSONAL FACTORS: Behavior pattern, Past/current experiences, Time since onset of injury/illness/exacerbation, and 3+ co morbidities: also affect patient's functional outcome.   REHAB POTENTIAL: Good  EVALUATION COMPLEXITY: Moderate  PLAN: OT FREQUENCY: 2x/week  OT DURATION: 12 weeks and PRN  PLANNED INTERVENTIONS: Therapeutic exercises, Therapeutic activity, Patient/Family education, Self Care, DME instructions, Manual lymph drainage, Compression bandaging, and Manual therapy, customer compression garment and device fitting.  PLAN FOR NEXT SESSION:  Commence teaching self bandaging Teach lymphatic pumping therex    Loel Dubonnet, MS, OTR/L, CLT-LANA 07/21/23 1:00 PM

## 2023-07-22 ENCOUNTER — Ambulatory Visit: Payer: Medicaid Other | Admitting: Urology

## 2023-07-22 VITALS — BP 160/85 | HR 83 | Ht 74.0 in | Wt 237.5 lb

## 2023-07-22 DIAGNOSIS — N401 Enlarged prostate with lower urinary tract symptoms: Secondary | ICD-10-CM

## 2023-07-22 DIAGNOSIS — N529 Male erectile dysfunction, unspecified: Secondary | ICD-10-CM

## 2023-07-22 DIAGNOSIS — R350 Frequency of micturition: Secondary | ICD-10-CM | POA: Diagnosis not present

## 2023-07-22 MED ORDER — TADALAFIL 20 MG PO TABS
20.0000 mg | ORAL_TABLET | Freq: Every day | ORAL | 11 refills | Status: DC | PRN
Start: 2023-07-22 — End: 2023-11-25

## 2023-07-22 NOTE — Progress Notes (Signed)
Marcelle Overlie Plume,acting as a scribe for Vanna Scotland, MD.,have documented all relevant documentation on the behalf of Vanna Scotland, MD,as directed by  Vanna Scotland, MD while in the presence of Vanna Scotland, MD.  07/22/2023 12:07 PM   Jonathon Snow 17-Sep-1961 [id number]  Referring provider: Danelle Berry, PA-C [address] [place],  [name] [address]  Chief Complaint Patient presents with  Erectile Dysfunction   HPI: 62 year-old male who presents today specifically to discuss erectile dysfunction.   He has been taking sildenafil for a number of years and feels as though it is not working anymore. He was last seen by me in April with a workup including cystoscopy and prostate sizing. At that point, he was having refractory symptoms despite Flomax and oxybutynin. He was deemed a good candidate for UroLift and was going to let us know if he would like to pursue anything else. He also had microscopic hematuria with no identifiable cause. His renal ultrasound showed bilateral stones, but otherwise no GU pathology.   Today, he reports inconsistent success with sildenafil, even after taking 4-5 tablets and would like to discuss other treatment options.    SHIM  Row Name 07/22/23 1031 SHIM: Over the last 6 months: How do you rate your confidence that you could get and keep an erection? Very Low When you had erections with sexual stimulation, how often were your erections hard enough for penetration (entering your partner)? A Few Times (much less than half the time) During sexual intercourse, how often were you able to maintain your erection after you had penetrated (entered) your partner? A Few Times (much less than half the time) During sexual intercourse, how difficult was it to maintain your erection to completion of intercourse? Very Difficult When you attempted sexual intercourse, how often was it satisfactory for you? 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 Overview:  superior sagittal sinus, left transverse sinus and cortical veins   COVID-19 virus infection 12/2020  Depression  DVT (deep venous thrombosis) (HCC)  Dyspnea  GERD (gastroesophageal reflux disease)  Heel spur, left 02/16/2019  Heel spur, right 02/16/2019  Herpes zoster infection of lumbar region 02/20/2020  History of kidney stones  Hyperlipidemia  Hypertension  Lupus (HCC)  Lymphedema 10/07/2018  Morbid obesity (HCC)  Opiate abuse, episodic (HCC) 02/26/2018  Osteoporosis  Pneumonia  PONV (postoperative nausea and vomiting)  Postphlebitic syndrome with ulcer, 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  Presence of IVC filter 03/22/2020 Removed  Pulmonary embolism (HCC)  Renal disorder Stage III   Surgical History: Past Surgical History: Procedure Laterality Date  ANKLE SURGERY Right  BACK SURGERY  BRONCHIAL WASHINGS N/A 11/01/2021 Procedure: BRONCHIAL WASHINGS;  Surgeon: Vida Rigger, MD;  Location: [facility] ORS;  Service: Thoracic;  Laterality: N/A;  COLONOSCOPY WITH PROPOFOL N/A 05/28/2020 Procedure: COLONOSCOPY WITH PROPOFOL;  Surgeon: Wyline Mood, MD;  Location: [facility] ENDOSCOPY;  Service: Endoscopy;  Laterality: N/A;  COLONOSCOPY WITH PROPOFOL N/A 07/01/2023 Procedure: COLONOSCOPY WITH PROPOFOL;  Surgeon: Wyline Mood, MD;  Location: [facility] ENDOSCOPY;  Service: Gastroenterology;  Laterality: N/A;  CYST EXCISION 92 or 93  Liver cyst removal [facility]  FLEXIBLE BRONCHOSCOPY N/A 11/01/2021 Procedure: FLEXIBLE BRONCHOSCOPY;  Surgeon: Vida Rigger, MD;  Location: [facility] ORS;  Service: Thoracic;  Laterality: N/A;  HIP PINNING,CANNULATED Left 02/19/2023 Procedure: PERCUTANEOUS FIXATION OF FEMORAL NECK;  Surgeon: Signa Kell, MD;  Location[facility]  ORS;  Service: Orthopedics;  Laterality: Left;  I  & D EXTREMITY Right 04/29/2017 Procedure: IRRIGATION AND DEBRIDEMENT EXTREMITY;  Surgeon: Ricarda Frame, MD;  Location: [facility] ORS;  Service: General;  Laterality: Right;  IRRIGATION AND DEBRIDEMENT ABSCESS Left 04/29/2017 Procedure: IRRIGATION AND DEBRIDEMENT Scrotal ABSCESS;  Surgeon: Ricarda Frame, MD;  Location: [facility] ORS;  Service: General;  Laterality: Left;  POLYPECTOMY 07/01/2023 Procedure: POLYPECTOMY;  Surgeon: Wyline Mood, MD;  Location: [facility] ENDOSCOPY;  Service: Gastroenterology;;   Home Medications:  Allergies as of 07/22/2023     Reactions Enalapril Other (See Comments) Unknown reaction Vicodin [hydrocodone-acetaminophen] Hives, Rash Severe headaches (also) NAME BRAND ONLY PER PT CAN TAKE GENERIC   Medication List   Accurate as of July 22, 2023 12:07 PM. If you have any questions, ask your nurse or doctor.   STOP taking these medications   ciclopirox 8 % solution Commonly known as: PENLAC meclizine 25 MG tablet Commonly known as: ANTIVERT ondansetron 4 MG tablet Commonly known as: ZOFRAN oxyCODONE 5 MG immediate release tablet Commonly known as: Oxy IR/ROXICODONE promethazine-dextromethorphan 6.25-15 MG/5ML syrup Commonly known as: PROMETHAZINE-DM Ryaltris 665-25 MCG/ACT Susp Generic drug: Olopatadine-Mometasone sildenafil 20 MG tablet Commonly known as: REVATIO   TAKE these medications   albuterol 108 (90 Base) MCG/ACT inhaler Commonly known as: VENTOLIN HFA Inhale 2 puffs into the lungs every 6 (six) hours as needed for wheezing or shortness of breath. amLODipine 5 MG tablet Commonly known as: NORVASC Take 1 tablet (5 mg total) by mouth daily. apixaban 5 MG Tabs tablet Commonly known as: Eliquis Take 1 tablet (5 mg total) by mouth 2 (two) times daily. ARIPiprazole 2 MG tablet Commonly known as: ABILIFY cholecalciferol 25 MCG (1000 UNIT) tablet Commonly known as: VITAMIN D3 Take 1,000 Units by mouth daily. gabapentin 300 MG  capsule Commonly known as: NEURONTIN Take 1 capsule by mouth 3 (three) times daily. levocetirizine 5 MG tablet Commonly known as: XYZAL Take 1 tablet (5 mg total) by mouth every evening. LORazepam 0.5 MG tablet Commonly known as: ATIVAN Take 1 tablet (0.5 mg total) by mouth 2 (two) times daily as needed for anxiety. losartan 100 MG tablet Commonly known as: COZAAR Take 1 tablet (100 mg total) by mouth daily. montelukast 10 MG tablet Commonly known as: SINGULAIR Take 1 tablet (10 mg total) by mouth at bedtime. multivitamin with minerals tablet Take 1 tablet by mouth daily. mycophenolate 500 MG tablet Commonly known as: CELLCEPT Take 1,000 mg by mouth 2 (two) times daily. predniSONE 10 MG tablet Commonly known as: DELTASONE Take 1 tablet by mouth daily. rosuvastatin 10 MG tablet Commonly known as: CRESTOR Take 1 tablet (10 mg total) by mouth daily. sertraline 100 MG tablet Commonly known as: ZOLOFT Take 1 tablet (100 mg total) by mouth daily. What changed: Another medication with the same name was removed. Continue taking this medication, and follow the directions you see here. tadalafil 20 MG tablet Commonly known as: CIALIS Take 1 tablet (20 mg total) by mouth daily as needed for erectile dysfunction. traMADol 50 MG tablet Commonly known as: ULTRAM Take 1 tablet (50 mg total) by mouth every 6 (six) hours as needed for moderate pain.    Allergies:  Allergies Allergen Reactions  Enalapril Other (See Comments) Unknown reaction  Vicodin [Hydrocodone-Acetaminophen] Hives and Rash Severe headaches (also) NAME BRAND ONLY PER PT CAN TAKE GENERIC   Family History: Family History Problem Relation Age of Onset  Hypertension Father  Heart disease Father  Clotting disorder Mother  Kidney disease Brother  Heart attack Maternal Grandmother  Heart attack Maternal Grandfather  Heart attack Paternal Grandfather   Social History:  reports that he has  never smoked. He has never used smokeless tobacco. He reports that he does not drink alcohol and does not use drugs.   Physical Exam: BP (!) 160/85   Pulse 83   Ht 6\' 2"  (1.88 m)   Wt 237 lb 8 oz (107.7 kg)   BMI 30.49 kg/m   Constitutional:  Alert and oriented, No acute distress. HEENT: [place] AT, moist mucus membranes.  Trachea midline, no masses. Neurologic: Grossly intact, no focal deficits, moving all 4 extremities. Psychiatric: Normal mood and affect.   Assessment & Plan:    1.  Erectile dysfunction - We discussed the pathophysiology of erectile dysfunction today along with possible contributing factors. Discussed possible treatment options including PDE 5 inhibitors, vacuum erectile device, intracavernosal injection, MUSE, and placement of the inflatable or malleable penile prosthesis for refractory cases.  In terms of PDE 5 inhibitors, we discussed contraindications for this medication as well as common side effects. Patient was counseled on optimal use. All of his questions were answered in detail. - Discontinue sildenafil due to lack of efficacy. - Start tadalafil 20 mg, to be taken as needed. Discussed that it takes longer to take effect (2+ hours) but lasts longer - Follow up via MyChart to report efficacy of tadalafil. -If he fails this, slightly may consider intracavernosal injections, we discussed our office protocol today along with risk and benefits.  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  He is not interested in prosthesis has a relative who has 1.  2. BPH - Follow up with previously scheduled visit next year   Return in about 1 year (around 07/21/2024).  I have reviewed the above documentation for accuracy and completeness, and I agree with the above.   Vanna Scotland, MD   [facility] [address], [address] [place], [name] [address] [phone]

## 2023-07-23 ENCOUNTER — Ambulatory Visit: Payer: Medicaid Other | Attending: Vascular Surgery | Admitting: Occupational Therapy

## 2023-07-23 DIAGNOSIS — I890 Lymphedema, not elsewhere classified: Secondary | ICD-10-CM | POA: Diagnosis not present

## 2023-07-23 DIAGNOSIS — F332 Major depressive disorder, recurrent severe without psychotic features: Secondary | ICD-10-CM | POA: Diagnosis not present

## 2023-07-23 NOTE — Therapy (Signed)
OUTPATIENT OCCUPATIONAL THERAPY NOTE  LOWER EXTREMITY LYMPHEDEMA  Patient Name: Jonathon Snow MRN: [id number] DOB:04/01/1961, 62 y.o., male Today's Date: 07/23/2023  END OF SESSION:   OT End of Session - 07/23/23 1210     Visit Number 5    Number of Visits 36    Date for OT Re-Evaluation 10/05/23    OT Start Time 1104    OT Stop Time 1210    OT Time Calculation (min) 66 min    Activity Tolerance Patient tolerated treatment well;No increased pain;Patient limited by pain   Pt moving constantly in seat and pursed lips bgreathing throughout eval session 2/2 reported back pain. 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   Overview:  superior sagittal sinus, left transverse sinus and cortical veins    COVID-19 virus infection 12/2020   Depression    DVT (deep venous thrombosis) (HCC)    Dyspnea    GERD (gastroesophageal reflux disease)    Heel spur, left 02/16/2019   Heel spur, right 02/16/2019   Herpes zoster infection of lumbar region 02/20/2020   History of kidney stones    Hyperlipidemia    Hypertension    Lupus (HCC)    Lymphedema 10/07/2018   Morbid obesity (HCC)    Opiate abuse, episodic (HCC) 02/26/2018   Osteoporosis    Pneumonia    PONV (postoperative nausea and vomiting)    Postphlebitic syndrome with ulcer, 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   Presence of IVC filter 03/22/2020   Removed   Pulmonary embolism (HCC)    Renal disorder    Stage III   Past Surgical History:  Procedure Laterality Date   ANKLE SURGERY Right    BACK SURGERY     BRONCHIAL WASHINGS N/A 11/01/2021   Procedure: BRONCHIAL WASHINGS;  Surgeon: Vida Rigger, MD;  Location: [facility] ORS;  Service: Thoracic;  Laterality: N/A;   COLONOSCOPY WITH PROPOFOL N/A 05/28/2020   Procedure: COLONOSCOPY WITH PROPOFOL;  Surgeon: Wyline Mood,  MD;  Location: [facility] ENDOSCOPY;  Service: Endoscopy;  Laterality: N/A;   COLONOSCOPY WITH PROPOFOL N/A 07/01/2023   Procedure: COLONOSCOPY WITH PROPOFOL;  Surgeon: Wyline Mood, MD;  Location: [facility] ENDOSCOPY;  Service: Gastroenterology;  Laterality: N/A;   CYST EXCISION  92 or 93    Liver cyst removal [facility]   FLEXIBLE BRONCHOSCOPY N/A 11/01/2021   Procedure: FLEXIBLE BRONCHOSCOPY;  Surgeon: Vida Rigger, MD;  Location: [facility] ORS;  Service: Thoracic;  Laterality: N/A;   HIP PINNING,CANNULATED Left 02/19/2023   Procedure: PERCUTANEOUS FIXATION OF FEMORAL NECK;  Surgeon: Signa Kell, MD;  Location: [facility] ORS;  Service: Orthopedics;  Laterality: Left;   I & D EXTREMITY Right 04/29/2017   Procedure: IRRIGATION AND DEBRIDEMENT EXTREMITY;  Surgeon: Ricarda Frame, MD;  Location: [facility] ORS;  Service: General;  Laterality: Right;   IRRIGATION AND DEBRIDEMENT ABSCESS Left 04/29/2017   Procedure: IRRIGATION AND DEBRIDEMENT Scrotal ABSCESS;  Surgeon: Ricarda Frame, MD;  Location: [facility] ORS;  Service: General;  Laterality: Left;   POLYPECTOMY  07/01/2023   Procedure: POLYPECTOMY;  Surgeon: Wyline Mood, MD;  Location: [facility] ENDOSCOPY;  Service: Gastroenterology;;   Patient Active Problem List   Diagnosis Date Noted   Chronic bilateral low back pain with left-sided sciatica 07/07/2023   Severe episode of recurrent major depressive disorder, 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  Neuropathy 07/07/2023   Hx of colonic polyps 07/01/2023   Adenomatous polyp of colon 07/01/2023   SLE (systemic lupus erythematosus related syndrome) (HCC) 02/19/2023   Closed left hip fracture, initial encounter (HCC) 02/18/2023   Pain and swelling of right lower extremity 02/03/2023   Erectile disorder 01/22/2023   Coronary artery disease of native artery of native heart with stable angina pectoris (HCC) 09/09/2022   Current moderate episode of major depressive disorder (HCC) 09/09/2022   Varicose veins of left lower  extremity with inflammation 01/03/2022   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) 09/18/2021   Prediabetes 11/08/2020   Stage 3a chronic kidney disease (HCC) 11/08/2020   Seasonal allergies 11/08/2020   Rhinosinusitis 11/08/2020   Long term current use of systemic steroids 11/08/2020   Anticoagulant disorder (HCC) 11/07/2020   Senile purpura (HCC) 11/07/2020   History of DVT (deep vein thrombosis) 03/22/2020   MDD (major depressive disorder), recurrent episode, mild (HCC) 09/12/2019   Other spondylosis with radiculopathy, lumbar region 02/16/2019   Osteoporosis 10/12/2018   Chronic venous insufficiency 10/07/2018   Lymphedema 10/07/2018   SLE glomerulonephritis syndrome, 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HCC) 03/03/2018   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) 10/29/2016   Mixed hyperlipidemia 01/15/2016   Primary hypertension 01/15/2016   Obesity (BMI 30.0-34.9) 01/15/2016   Long term current use of anticoagulant 10/04/2015   Systemic lupus erythematosus (HCC) 05/30/2015   COPD, moderate (HCC) 06/27/2014   History of pulmonary embolism 12/11/2013   Nodule of right lung 12/11/2013   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 He presents without compression in place. Pt reports pain is unchanged from last visit. Pt reports he tolerated compression wrapping without difficulty during  the visit interval. He was able to apply wraps at home without difficulty. He brings clean compression wraps to clinic.  PERTINENT HISTORY: systemic Lupus, HTN, CVI, varicose veins w inflammation, CKD, stage III, Hx DVT, Hx PE, CAD, COPD, L hip Fx 2/24. 2019 IVC filter removed, post phlebitic syndrome w ulcer, ankle Fx-R, back  sx, hx scrotal abscess, Major depressive disorder. Medications with swelling side effect include amlodipine (NORVASC) , Gabapentin and Prednisone.  PAIN:  Are you having pain? Yes: NPRS scale: 5/10 Pain location: L lower extremity- medial thigh and bottom of foot and toes, back Pain description: burning, tingling ,tight, stabbing, heavy Aggravating factors: walking, standing, extended sitting with dependent positioning Relieving factors: elevation  PRECAUTIONS: Other: LYMPHEDEMA PRECAUTIONS: CARDIAC,  PULMONARY,  WEIGHT BEARING RESTRICTIONS: No  FALLS:  Has patient fallen in last 6 months? Yes. Number of falls 1. Resulted in L hip fracture  LIVING ENVIRONMENT: Lives with:  lives alone Lives in: House/apartment Stairs: No; External: 0 steps; none Has following equipment at home: shower chair  OCCUPATION: unemployed  LEISURE: outdoors person. I like fishing, camping, hiking  HAND DOMINANCE: right   PRIOR LEVEL OF FUNCTION: Independent  PATIENT GOALS: "I'd like to get back to the condition I was in before all this started happening to me."   OBJECTIVE:  FOTO functional outcome measure. Intake score: 45%  Lymphedema Life Impact Scale (Lymphedema life Impact Scale) Intake: 82.35%  (The extent to which lymphedema -related problems impacted your life during the last week.)  COGNITION/ MOOD: Overall cognitive status: Within functional limits for tasks assessed with mild anxiety initially, then more relaxed by end of session  OBSERVATIONS / OTHER ASSESSMENTS: Mild, stage II, BLE lymphedema 2 CVI and suspected, long term lymphatic overload by systemic fluid  accumulation   BLE COMPARATIVE LIMB VOLUMETRICS Initial 07/14/23   LANDMARK RIGHT  (dominant)  R LEG (A-D) 3615.6 ml  R THIGH (E-G) 4542.6 ml  R FULL LIMB (A-G) 8163.2 ml  Limb Volume differential (LVD)  %  Volume change since initial %  Volume change overall V  (Blank rows = not tested)  LANDMARK LEFT  (Rx)  L LEG (A-D) 4526.4 ml  L THIGH (E-G) 6246.3 ml  L FULL LIMB (A-G) 10822.7 ml  Limb Volume differential (LVD)  Leg LVD = 19.76%, L>R. Thigh LVD = 27.77%, L>R Full limb LVD = 24.6%, L>R  Volume change since initial %  Volume change overall %  (Blank rows = not tested)   TODAY'S TREATMENT:                                                                                                                                         Pt edu LE self care LLE/LLQ  MLD LLE multilayer  gradient compression wrapping- toes to groin  PATIENT EDUCATION:  Education details: Provided Pt/ family education regarding lymphatic structure and function, lymphedema etiology, onset patterns and stages of progression. Taught interaction between blood circulatory system and lymphatic circulation.Discussed  impact of gravity and co-morbidities on lymphatic function. Outlined Complete Decongestive Therapy (CDT)  as standard of care and provided in depth information regarding 4 primary components of Intensive and Self Management Phases, including Manual Lymph Drainage (MLD), compression wrapping and garments, skin  care, and therapeutic exercise. Homero Fellers discussion with re need for frequent attendance and high burden of care when caregiver is needed, impact of co morbidities. We discussed  the chronic, progressive nature of lymphedema and Importance of daily, ongoing LE self-care essential for limiting progression and infection risk. Discussed the demanding nature of CDT, high burden of care between visits and throughout self-management phase,  and importance of high level of compliance with all home program components  for optimal outcome.  Person educated: Patient Education method: Explanation, Demonstration, and Handouts Education comprehension: verbalized understanding, returned demonstration, needs more edu  HOME EXERCISE PROGRAM: At least 2 x daily BLE Lymphatic Pumping There ex Daily BLE/BLQ Skin care to limit infection risk and increase skin excursion Compression Intensive stage compression: multilayer short stretch wraps with gradient techniques. One limb at a time. Thigh length, multi-layer gradient compression wrap to L leg from base of toes to groin using 1 each 8, 10 and 12 cm wide short stretch bandages over single layer of Rosidal foam on the leg in circumferential pattern, and 2 additional 12 cm wide bandages over Rosidal  to groin.  Self-management Phase Compression: Custom, flat knit, BLE compression knee highs. Consider Elvarex classic for daytime and Jobst Relax for HOS  Custom-made gradient compression garments and HOS devices are medically necessary in this case because they are uniquely sized and shaped to fit the exact dimensions of the affected extremities with deformities, and to provide accurate and consistent gradient compression and containment, essential to optimally managing this patient's symptoms of chronic, progressive lymphedema. Multiple custom compression garments are needed for optimal hygiene to limit infection risk. Custom compression garments should be replaced q 3-6 months When worn consistently for optimal lipo-lymphedema self-management over time.  ASSESSMENT: CLINICAL IMPRESSION: Continued MLD to LLE/ LLQ as established using functional inguinal LN, deep abdominal lymphatics to terminus. Pt tolerated manual therapy without increased pain. After skilled teaching he was able to independently performs the J stroke on his neck   as first step of short neck sequence. Applied gradient compression wraps from base of toes to groin. Pt demonstrates a nice response to CDT thus far  with visible and palpable limb volume reduction in full limb. Thigh swelling remains more obviously swollen and painful    by report. Cont as per POC.\   SHORT TERM GOALS: Target date: 4th OT Rx visit   Pt will demonstrate understanding of lymphedema precautions and prevention strategies with modified independence using a printed reference to identify at least 5 precautions and discussing how s/he may implement them into daily life to reduce risk of progression with extra time. Baseline:Max A Goal status: INITIAL  2.  Pt will be able to apply multilayer, knee length, gradient, compression wraps to one leg at a time with modified assistance (extra time and assistive device/s) to decrease limb volume, to limit infection risk, and to limit lymphedema progression.  Baseline: Dependent Goal status: 07/17/23  GOAL MET  LONG TERM GOALS: Target date: 10/05/23  Given this patient's Intake score of 45 % on the functional outcomes FOTO tool, patient will experience an increase in function of 3 points to improve basic and instrumental ADLs performance, including lymphedema self-care.  Baseline: Max A Goal status: INITIAL  2.  Given this patient's Intake score of 82.35% on the Lymphedema Life Impact Scale (LLIS), patient will experience a reduction of at least 5 points in her perceived level of functional impairment resulting from lymphedema to improve functional performance and quality of life (  QOL). Baseline: 64.71% Goal status: INITIAL  3.  Pt will achieve at least a 10% volume reduction in LLE to return limb to typical size and shape, to limit infection risk and LE progression, to decrease pain, to improve function. Baseline: Dependent Goal status: INITIAL  4.  Pt will obtain proper compression garments/devices and achieve modified independence (extra time + assistive devices) with donning/doffing to optimize limb volume reductions and limit LE  progression over time. Baseline:  Goal status:  INITIAL  5.  During Intensive phase CDT , with modified independence, Pt will achieve at least 85% compliance with all lymphedema self-care home program components, including daily skin care, compression wraps and /or garments, simple self MLD and lymphatic pumping therex to habituate LE self care protocol  into ADLs for optimal LE self-management over time. Baseline: Dependent Goal status: INITIAL   OBJECTIVE IMPAIRMENTS: Abnormal gait, cardiopulmonary status limiting activity, decreased activity tolerance, decreased endurance, decreased knowledge of condition, decreased knowledge of use of DME, decreased mobility, and difficulty walking.   ACTIVITY LIMITATIONS: carrying, lifting, bending, sitting, standing, squatting, sleeping, stairs, transfers, bathing, dressing, reach over head, hygiene/grooming, and functional ambulation and mobility.  PARTICIPATION LIMITATIONS: meal prep, cleaning, laundry, interpersonal relationship, driving, shopping, community activity, occupation, and yard work  PERSONAL FACTORS: Behavior pattern, Past/current experiences, Time since onset of injury/illness/exacerbation, and 3+ co morbidities: also affect patient's functional outcome.   REHAB POTENTIAL: Good  EVALUATION COMPLEXITY: Moderate  PLAN: OT FREQUENCY: 2x/week  OT DURATION: 12 weeks and PRN  PLANNED INTERVENTIONS: Therapeutic exercises, Therapeutic activity, Patient/Family education, Self Care, DME instructions, Manual lymph drainage, Compression bandaging, and Manual therapy, customer compression garment and device fitting.  PLAN FOR NEXT SESSION:  Cont teaching LE self care-lymphatic pumping therex MLD Bandaging Skin care    Loel Dubonnet, MS, OTR/L, CLT-LANA 07/23/23 12:12 PM

## 2023-07-28 ENCOUNTER — Ambulatory Visit: Payer: Medicaid Other | Admitting: Podiatry

## 2023-07-28 ENCOUNTER — Encounter: Payer: Self-pay | Admitting: Occupational Therapy

## 2023-07-28 ENCOUNTER — Ambulatory Visit: Payer: Medicaid Other | Admitting: Occupational Therapy

## 2023-07-28 DIAGNOSIS — I890 Lymphedema, not elsewhere classified: Secondary | ICD-10-CM | POA: Diagnosis not present

## 2023-07-29 ENCOUNTER — Encounter: Payer: Self-pay | Admitting: Occupational Therapy

## 2023-07-29 NOTE — Therapy (Signed)
OUTPATIENT OCCUPATIONAL THERAPY NOTE  LOWER EXTREMITY LYMPHEDEMA  Patient Name: Jonathon Snow MRN: [id number] DOB:10-Sep-1961, 62 y.o., male Today's Date: 07/29/2023  END OF SESSION:   OT End of Session - 07/28/23 1310     Visit Number 6    Number of Visits 36    Date for OT Re-Evaluation 10/05/23    OT Start Time 0105    OT Stop Time 0205    OT Time Calculation (min) 60 min    Activity Tolerance Patient tolerated treatment well;No increased pain   Pt moving constantly in seat and pursed lips bgreathing throughout eval session 2/2 reported back pain. 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   Overview:  superior sagittal sinus, left transverse sinus and cortical veins    COVID-19 virus infection 12/2020   Depression    DVT (deep venous thrombosis) (HCC)    Dyspnea    GERD (gastroesophageal reflux disease)    Heel spur, left 02/16/2019   Heel spur, right 02/16/2019   Herpes zoster infection of lumbar region 02/20/2020   History of kidney stones    Hyperlipidemia    Hypertension    Lupus (HCC)    Lymphedema 10/07/2018   Morbid obesity (HCC)    Opiate abuse, episodic (HCC) 02/26/2018   Osteoporosis    Pneumonia    PONV (postoperative nausea and vomiting)    Postphlebitic syndrome with ulcer, 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   Presence of IVC filter 03/22/2020   Removed   Pulmonary embolism (HCC)    Renal disorder    Stage III   Past Surgical History:  Procedure Laterality Date   ANKLE SURGERY Right    BACK SURGERY     BRONCHIAL WASHINGS N/A 11/01/2021   Procedure: BRONCHIAL WASHINGS;  Surgeon: Vida Rigger, MD;  Location: [facility] ORS;  Service: Thoracic;  Laterality: N/A;   COLONOSCOPY WITH PROPOFOL N/A 05/28/2020   Procedure: COLONOSCOPY WITH PROPOFOL;  Surgeon: Wyline Mood, MD;  Location: [facility]  ENDOSCOPY;  Service: Endoscopy;  Laterality: N/A;   COLONOSCOPY WITH PROPOFOL N/A 07/01/2023   Procedure: COLONOSCOPY WITH PROPOFOL;  Surgeon: Wyline Mood, MD;  Location: [facility] ENDOSCOPY;  Service: Gastroenterology;  Laterality: N/A;   CYST EXCISION  92 or 93    Liver cyst removal [facility]   FLEXIBLE BRONCHOSCOPY N/A 11/01/2021   Procedure: FLEXIBLE BRONCHOSCOPY;  Surgeon: Vida Rigger, MD;  Location: [facility] ORS;  Service: Thoracic;  Laterality: N/A;   HIP PINNING,CANNULATED Left 02/19/2023   Procedure: PERCUTANEOUS FIXATION OF FEMORAL NECK;  Surgeon: Signa Kell, MD;  Location: [facility] ORS;  Service: Orthopedics;  Laterality: Left;   I & D EXTREMITY Right 04/29/2017   Procedure: IRRIGATION AND DEBRIDEMENT EXTREMITY;  Surgeon: Ricarda Frame, MD;  Location: [facility] ORS;  Service: General;  Laterality: Right;   IRRIGATION AND DEBRIDEMENT ABSCESS Left 04/29/2017   Procedure: IRRIGATION AND DEBRIDEMENT Scrotal ABSCESS;  Surgeon: Ricarda Frame, MD;  Location: [facility] ORS;  Service: General;  Laterality: Left;   POLYPECTOMY  07/01/2023   Procedure: POLYPECTOMY;  Surgeon: Wyline Mood, MD;  Location: [facility] ENDOSCOPY;  Service: Gastroenterology;;   Patient Active Problem List   Diagnosis Date Noted   Chronic bilateral low back pain with left-sided sciatica 07/07/2023   Severe episode of recurrent major depressive disorder, 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   Neuropathy  07/07/2023   Hx of colonic polyps 07/01/2023   Adenomatous polyp of colon 07/01/2023   SLE (systemic lupus erythematosus related syndrome) (HCC) 02/19/2023   Closed left hip fracture, initial encounter (HCC) 02/18/2023   Pain and swelling of right lower extremity 02/03/2023   Erectile disorder 01/22/2023   Coronary artery disease of native artery of native heart with stable angina pectoris (HCC) 09/09/2022   Current moderate episode of major depressive disorder (HCC) 09/09/2022   Varicose veins of left lower extremity with  inflammation 01/03/2022   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) 09/18/2021   Prediabetes 11/08/2020   Stage 3a chronic kidney disease (HCC) 11/08/2020   Seasonal allergies 11/08/2020   Rhinosinusitis 11/08/2020   Long term current use of systemic steroids 11/08/2020   Anticoagulant disorder (HCC) 11/07/2020   Senile purpura (HCC) 11/07/2020   History of DVT (deep vein thrombosis) 03/22/2020   MDD (major depressive disorder), recurrent episode, mild (HCC) 09/12/2019   Other spondylosis with radiculopathy, lumbar region 02/16/2019   Osteoporosis 10/12/2018   Chronic venous insufficiency 10/07/2018   Lymphedema 10/07/2018   SLE glomerulonephritis syndrome, 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HCC) 03/03/2018   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) 10/29/2016   Mixed hyperlipidemia 01/15/2016   Primary hypertension 01/15/2016   Obesity (BMI 30.0-34.9) 01/15/2016   Long term current use of anticoagulant 10/04/2015   Systemic lupus erythematosus (HCC) 05/30/2015   COPD, moderate (HCC) 06/27/2014   History of pulmonary embolism 12/11/2013   Nodule of right lung 12/11/2013   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 He presents without compression in place. Pt reports pain is unchanged from last visit. Pt reports he tolerated compression wrapping without difficulty during the visit  interval. He was able to apply wraps at home without difficulty. He brings clean compression wraps to clinic.  PERTINENT HISTORY: systemic Lupus, HTN, CVI, varicose veins w inflammation, CKD, stage III, Hx DVT, Hx PE, CAD, COPD, L hip Fx 2/24. 2019 IVC filter removed, post phlebitic syndrome w ulcer, ankle Fx-R, back sx,  hx scrotal abscess, Major depressive disorder. Medications with swelling side effect include amlodipine (NORVASC) , Gabapentin and Prednisone.  PAIN:  Are you having pain? Yes: NPRS scale: 5/10 Pain location: L lower extremity- medial thigh and bottom of foot and toes, back Pain description: burning, tingling ,tight, stabbing, heavy Aggravating factors: walking, standing, extended sitting with dependent positioning Relieving factors: elevation  PRECAUTIONS: Other: LYMPHEDEMA PRECAUTIONS: CARDIAC,  PULMONARY,  FALLS:  Has patient fallen in last 6 months? Yes. Number of falls 1. Resulted in L hip fracture  LIVING ENVIRONMENT: Lives with:  lives alone Lives in: House/apartment Stairs: No; External: 0 steps; none Has following equipment at home: shower chair  PRIOR LEVEL OF FUNCTION: Independent  PATIENT GOALS: "I'd like to get back to the condition I was in before all this started happening to me."   OBJECTIVE:  FOTO functional outcome measure. Intake score: 45%  Lymphedema Life Impact Scale (Lymphedema life Impact Scale) Intake: 82.35%  (The extent to which lymphedema -related problems impacted your life during the last week.)  COGNITION/ MOOD:  Within functional limits for tasks assessed   OBSERVATIONS / OTHER ASSESSMENTS: Mild, stage II, BLE lymphedema 2 CVI and suspected, long term lymphatic overload by systemic fluid accumulation   BLE COMPARATIVE LIMB VOLUMETRICS Initial 07/14/23   LANDMARK RIGHT  (dominant)  R LEG (A-D) 3615.6 ml  R THIGH (E-G) 4542.6 ml  R FULL LIMB (A-G) 8163.2 ml  Limb Volume differential (LVD)  %  Volume change since initial %   Volume change overall V  (Blank rows = not tested)  LANDMARK LEFT  (Rx)  L LEG (A-D) 4526.4 ml  L THIGH (E-G) 6246.3 ml  L FULL LIMB (A-G) 10822.7 ml  Limb Volume differential (LVD)  Leg LVD = 19.76%, L>R. Thigh LVD = 27.77%, L>R Full limb LVD = 24.6%, L>R  Volume change since initial %  Volume change overall %  (Blank rows = not tested)   TODAY'S TREATMENT:                                                                                                                                         Pt edu LE self care LLE/LLQ  MLD LLE multilayer  gradient compression wrapping- toes to groin  PATIENT EDUCATION:  Education details:Continued Pt/ CG edu for lymphedema self care home program throughout session. Topics include outcome of comparative limb volumetrics- starting limb volume differentials (LVDs), technology and gradient techniques used for short stretch, multilayer compression wrapping, simple self-MLD, therapeutic lymphatic pumping exercises, skin/nail care, LE precautions,. compression garment recommendations and specifications, wear and care schedule and compression garment donning / doffing w assistive devices. Discussed progress towards all OT goals since commencing CDT. All questions answered to the Pt's satisfaction. Good return. Person educated: Patient and Spouse Education method: Explanation, Demonstration, and Handouts Education comprehension: verbalized understanding, returned demonstration, verbal cues required, and needs further education  HOME EXERCISE PROGRAM: At least 2 x daily BLE Lymphatic Pumping There ex Daily BLE/BLQ Skin care to limit infection risk and increase skin excursion Compression Intensive stage compression: multilayer short stretch wraps with gradient techniques. One limb at a time. Thigh length, multi-layer gradient compression wrap to L leg from base of toes to groin using 1 each 8, 10 and 12 cm wide short stretch bandages over single layer of  Rosidal foam on the leg in circumferential pattern, and 2 additional 12 cm wide bandages over Rosidal  to groin.  Self-management Phase Compression: Custom, flat knit, BLE compression knee highs. Consider Elvarex classic for daytime and Jobst Relax for HOS  Custom-made gradient compression garments and HOS devices are medically necessary in this case because they are uniquely sized and shaped to fit the exact dimensions of the affected extremities with deformities, and to provide accurate and consistent gradient compression and containment, essential to optimally managing this patient's symptoms of chronic, progressive lymphedema. Multiple custom compression garments are needed for optimal hygiene to limit infection risk. Custom compression garments should be replaced q 3-6 months When worn consistently for optimal lipo-lymphedema self-management over time.  ASSESSMENT: CLINICAL IMPRESSION: LLE swelling appears mildly increased since last visit. Pt is not aware of any precipitating cause . It is certainly possible wraps have been off for several hours prior to session today as leg is palpably more dense than last session as well. Continued MLD to LLE/ LLQ as established using functional inguinal LN, deep abdominal lymphatics to terminus. Pt tolerated manual therapy without increased pain. Applied gradient compression wraps from base of toes to groin.  Cont as per POC.\   SHORT TERM GOALS: Target date: 4th OT Rx visit   Pt will demonstrate understanding of lymphedema precautions and prevention strategies with modified independence using a printed reference to identify at least 5 precautions and discussing how s/he may implement them into daily life to reduce risk of progression with extra time. Baseline:Max A Goal status: INITIAL  2.  Pt will be able to apply multilayer, knee length, gradient, compression wraps to one leg at a time with modified assistance (extra time and assistive device/s) to  decrease limb volume, to limit infection risk, and to limit lymphedema progression.  Baseline: Dependent Goal status: 07/17/23  GOAL MET  LONG TERM GOALS: Target date: 10/05/23  Given this patient's Intake score of 45 % on the functional outcomes FOTO tool, patient will experience an increase in function of 3 points to improve basic and instrumental ADLs performance, including lymphedema self-care.  Baseline: Max A Goal status: INITIAL  2.  Given this patient's Intake score of 82.35% on the Lymphedema Life Impact Scale (LLIS), patient will experience a reduction of at least 5 points in her perceived level of functional impairment resulting from lymphedema to improve functional performance and quality of life (QOL). Baseline: 64.71% Goal status: INITIAL  3.  Pt will achieve at least a 10% volume reduction in LLE to return limb to typical size and shape, to limit infection risk and LE progression, to decrease pain, to improve function. Baseline: Dependent Goal status: INITIAL  4.  Pt will obtain proper compression garments/devices and achieve modified independence (extra time + assistive devices) with donning/doffing to optimize limb volume reductions and limit LE  progression over time. Baseline:  Goal status: INITIAL  5.  During Intensive phase CDT , with modified independence, Pt will achieve at least 85% compliance with all lymphedema self-care home program components, including daily skin care,  compression wraps and /or garments, simple self MLD and lymphatic pumping therex to habituate LE self care protocol  into ADLs for optimal LE self-management over time. Baseline: Dependent Goal status: INITIAL   OBJECTIVE IMPAIRMENTS: Abnormal gait, cardiopulmonary status limiting activity, decreased activity tolerance, decreased endurance, decreased knowledge of condition, decreased knowledge of use of DME, decreased mobility, and difficulty walking.   ACTIVITY LIMITATIONS: carrying, lifting,  bending, sitting, standing, squatting, sleeping, stairs, transfers, bathing, dressing, reach over head, hygiene/grooming, and functional ambulation and mobility.  PARTICIPATION LIMITATIONS: meal prep, cleaning, laundry, interpersonal relationship, driving, shopping, community activity, occupation, and yard work  PERSONAL FACTORS: Behavior pattern, Past/current experiences, Time since onset of injury/illness/exacerbation, and 3+ co morbidities: also affect patient's functional outcome.   REHAB POTENTIAL: Good  EVALUATION COMPLEXITY: Moderate  PLAN: OT FREQUENCY: 2x/week  OT DURATION: 12 weeks and PRN  PLANNED INTERVENTIONS: Therapeutic exercises, Therapeutic activity, Patient/Family education, Self Care, DME instructions, Manual lymph drainage, Compression bandaging, and Manual therapy, customer compression garment and device fitting.  PLAN FOR NEXT SESSION:  Cont teaching LE self care-lymphatic pumping therex MLD Bandaging Skin care    Loel Dubonnet, MS, OTR/L, CLT-LANA 07/29/23 9:49 AM

## 2023-07-31 ENCOUNTER — Ambulatory Visit: Payer: Medicaid Other | Admitting: Occupational Therapy

## 2023-07-31 ENCOUNTER — Encounter: Payer: Self-pay | Admitting: Occupational Therapy

## 2023-07-31 DIAGNOSIS — I890 Lymphedema, not elsewhere classified: Secondary | ICD-10-CM | POA: Diagnosis not present

## 2023-07-31 DIAGNOSIS — M5416 Radiculopathy, lumbar region: Secondary | ICD-10-CM | POA: Diagnosis not present

## 2023-07-31 NOTE — Therapy (Signed)
OUTPATIENT OCCUPATIONAL THERAPY NOTE  LOWER EXTREMITY LYMPHEDEMA  Patient Name: Jonathon Snow MRN: [id number] DOB:10-Mar-1961, 62 y.o., male Today's Date: 07/31/2023  END OF SESSION:   OT End of Session - 07/31/23 1000     Visit Number 7    Number of Visits 36    Date for OT Re-Evaluation 10/05/23    OT Start Time 1000    OT Stop Time 1100    OT Time Calculation (min) 60 min    Activity Tolerance Patient tolerated treatment well;No increased pain   Pt moving constantly in seat and pursed lips bgreathing throughout eval session 2/2 reported back pain. 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   Overview:  superior sagittal sinus, left transverse sinus and cortical veins    COVID-19 virus infection 12/2020   Depression    DVT (deep venous thrombosis) (HCC)    Dyspnea    GERD (gastroesophageal reflux disease)    Heel spur, left 02/16/2019   Heel spur, right 02/16/2019   Herpes zoster infection of lumbar region 02/20/2020   History of kidney stones    Hyperlipidemia    Hypertension    Lupus (HCC)    Lymphedema 10/07/2018   Morbid obesity (HCC)    Opiate abuse, episodic (HCC) 02/26/2018   Osteoporosis    Pneumonia    PONV (postoperative nausea and vomiting)    Postphlebitic syndrome with ulcer, 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   Presence of IVC filter 03/22/2020   Removed   Pulmonary embolism (HCC)    Renal disorder    Stage III   Past Surgical History:  Procedure Laterality Date   ANKLE SURGERY Right    BACK SURGERY     BRONCHIAL WASHINGS N/A 11/01/2021   Procedure: BRONCHIAL WASHINGS;  Surgeon: Vida Rigger, MD;  Location: [facility] ORS;  Service: Thoracic;  Laterality: N/A;   COLONOSCOPY WITH PROPOFOL N/A 05/28/2020   Procedure: COLONOSCOPY WITH PROPOFOL;  Surgeon: Wyline Mood, MD;  Location: [facility]  ENDOSCOPY;  Service: Endoscopy;  Laterality: N/A;   COLONOSCOPY WITH PROPOFOL N/A 07/01/2023   Procedure: COLONOSCOPY WITH PROPOFOL;  Surgeon: Wyline Mood, MD;  Location: [facility] ENDOSCOPY;  Service: Gastroenterology;  Laterality: N/A;   CYST EXCISION  92 or 93    Liver cyst removal [facility]   FLEXIBLE BRONCHOSCOPY N/A 11/01/2021   Procedure: FLEXIBLE BRONCHOSCOPY;  Surgeon: Vida Rigger, MD;  Location: [facility] ORS;  Service: Thoracic;  Laterality: N/A;   HIP PINNING,CANNULATED Left 02/19/2023   Procedure: PERCUTANEOUS FIXATION OF FEMORAL NECK;  Surgeon: Signa Kell, MD;  Location: [facility] ORS;  Service: Orthopedics;  Laterality: Left;   I & D EXTREMITY Right 04/29/2017   Procedure: IRRIGATION AND DEBRIDEMENT EXTREMITY;  Surgeon: Ricarda Frame, MD;  Location: [facility] ORS;  Service: General;  Laterality: Right;   IRRIGATION AND DEBRIDEMENT ABSCESS Left 04/29/2017   Procedure: IRRIGATION AND DEBRIDEMENT Scrotal ABSCESS;  Surgeon: Ricarda Frame, MD;  Location: [facility] ORS;  Service: General;  Laterality: Left;   POLYPECTOMY  07/01/2023   Procedure: POLYPECTOMY;  Surgeon: Wyline Mood, MD;  Location: [facility] ENDOSCOPY;  Service: Gastroenterology;;   Patient Active Problem List   Diagnosis Date Noted   Chronic bilateral low back pain with left-sided sciatica 07/07/2023   Severe episode of recurrent major depressive disorder, 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   Neuropathy  07/07/2023   Hx of colonic polyps 07/01/2023   Adenomatous polyp of colon 07/01/2023   SLE (systemic lupus erythematosus related syndrome) (HCC) 02/19/2023   Closed left hip fracture, initial encounter (HCC) 02/18/2023   Pain and swelling of right lower extremity 02/03/2023   Erectile disorder 01/22/2023   Coronary artery disease of native artery of native heart with stable angina pectoris (HCC) 09/09/2022   Current moderate episode of major depressive disorder (HCC) 09/09/2022   Varicose veins of left lower extremity with  inflammation 01/03/2022   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) 09/18/2021   Prediabetes 11/08/2020   Stage 3a chronic kidney disease (HCC) 11/08/2020   Seasonal allergies 11/08/2020   Rhinosinusitis 11/08/2020   Long term current use of systemic steroids 11/08/2020   Anticoagulant disorder (HCC) 11/07/2020   Senile purpura (HCC) 11/07/2020   History of DVT (deep vein thrombosis) 03/22/2020   MDD (major depressive disorder), recurrent episode, mild (HCC) 09/12/2019   Other spondylosis with radiculopathy, lumbar region 02/16/2019   Osteoporosis 10/12/2018   Chronic venous insufficiency 10/07/2018   Lymphedema 10/07/2018   SLE glomerulonephritis syndrome, 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HCC) 03/03/2018   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) 10/29/2016   Mixed hyperlipidemia 01/15/2016   Primary hypertension 01/15/2016   Obesity (BMI 30.0-34.9) 01/15/2016   Long term current use of anticoagulant 10/04/2015   Systemic lupus erythematosus (HCC) 05/30/2015   COPD, moderate (HCC) 06/27/2014   History of pulmonary embolism 12/11/2013   Nodule of right lung 12/11/2013   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 He presents without compression in place. Pt reports pain is a little better than it was today compared from last visit. He rates it today as 4/10. Pt reports he tolerated  compression wrapping without difficulty during the visit interval. He was able to apply wraps at home without difficulty. He brings clean compression wraps to clinic. He states his LLE is getting better.  PERTINENT HISTORY: systemic Lupus, HTN, CVI, varicose veins w inflammation, CKD, stage III, Hx DVT, Hx  PE, CAD, COPD, L hip Fx 2/24. 2019 IVC filter removed, post phlebitic syndrome w ulcer, ankle Fx-R, back sx, hx scrotal abscess, Major depressive disorder. Medications with swelling side effect include amlodipine (NORVASC) , Gabapentin and Prednisone.  PAIN:  Are you having pain? Yes: NPRS scale: 4/10 Pain location: L lower extremity- medial thigh and bottom of foot and toes, back Pain description: burning, tingling ,tight, stabbing, heavy Aggravating factors: walking, standing, extended sitting with dependent positioning Relieving factors: elevation  PRECAUTIONS: Other: LYMPHEDEMA PRECAUTIONS: CARDIAC,  PULMONARY,  FALLS:  Has patient fallen in last 6 months? Yes. Number of falls 1. Resulted in L hip fracture  LIVING ENVIRONMENT: Lives with:  lives alone Lives in: House/apartment Stairs: No; External: 0 steps; none Has following equipment at home: shower chair  PRIOR LEVEL OF FUNCTION: Independent  PATIENT GOALS: "I'd like to get back to the condition I was in before all this started happening to me."   OBJECTIVE:  FOTO functional outcome measure. Intake score: 45%  Lymphedema Life Impact Scale (Lymphedema life Impact Scale) Intake: 82.35%  (The extent to which lymphedema -related problems impacted your life during the last week.)  COGNITION/ MOOD:  Within functional limits for tasks assessed   OBSERVATIONS / OTHER ASSESSMENTS: Mild, stage II, BLE lymphedema 2 CVI and suspected, long term lymphatic overload by systemic fluid accumulation   BLE COMPARATIVE LIMB VOLUMETRICS Initial 07/14/23   LANDMARK RIGHT  (dominant)  R LEG (A-D) 3615.6 ml  R THIGH (E-G) 4542.6 ml  R  FULL LIMB (A-G) 8163.2 ml  Limb Volume differential (LVD)  %  Volume change since initial %  Volume change overall V  (Blank rows = not tested)  LANDMARK LEFT  (Rx)  L LEG (A-D) 4526.4 ml  L THIGH (E-G) 6246.3 ml  L FULL LIMB (A-G) 10822.7 ml  Limb Volume differential (LVD)  Leg LVD = 19.76%, L>R. Thigh LVD = 27.77%, L>R Full limb LVD = 24.6%, L>R  Volume change since initial %  Volume change overall %  (Blank rows = not tested)   TODAY'S TREATMENT:                                                                                                                                         Pt edu LE self care LLE/LLQ  MLD LLE multilayer  gradient compression wrapping- toes to groin  PATIENT EDUCATION:  Education details:Continued Pt/ CG edu for lymphedema self care home program throughout session. Topics include outcome of comparative limb volumetrics- starting limb volume differentials (LVDs), technology and gradient techniques used for short stretch, multilayer compression wrapping, simple self-MLD, therapeutic lymphatic pumping exercises, skin/nail care, LE precautions,. compression garment recommendations and specifications, wear and care schedule and compression garment donning / doffing w assistive devices. Discussed progress towards all OT goals since commencing CDT. All questions answered to the Pt's satisfaction. Good return. Person educated: Patient and Spouse  Education method: Explanation, Demonstration, and Handouts Education comprehension: verbalized understanding, returned demonstration, verbal cues required, and needs further education   HOME EXERCISE PROGRAM: At least 2 x daily BLE Lymphatic Pumping There ex Daily BLE/BLQ Skin care to limit infection risk and increase skin excursion Compression Intensive stage compression: multilayer short stretch wraps with gradient techniques. One limb at a time. Thigh length, multi-layer gradient compression wrap to L leg from base of toes  to groin using 1 each 8, 10 and 12 cm wide short stretch bandages over single layer of Rosidal foam on the leg in circumferential pattern, and 2 additional 12 cm wide bandages over Rosidal  to groin.  Self-management Phase Compression: Custom, flat knit, BLE compression knee highs. Consider Elvarex classic for daytime and Jobst Relax for HOS  Custom-made gradient compression garments and HOS devices are medically necessary in this case because they are uniquely sized and shaped to fit the exact dimensions of the affected extremities with deformities, and to provide accurate and consistent gradient compression and containment, essential to optimally managing this patient's symptoms of chronic, progressive lymphedema. Multiple custom compression garments are needed for optimal hygiene to limit infection risk. Custom compression garments should be replaced q 3-6 months When worn consistently for optimal lipo-lymphedema self-management over time.  ASSESSMENT: CLINICAL IMPRESSION:  Continued MLD to LLE/ LLQ as established using functional inguinal LN, deep abdominal lymphatics to terminus. Pt tolerated manual therapy without increased pain. Applied gradient compression wraps from base of toes to groin.  Productive session. Slow but steady response to Rx. Cont as per POC.   SHORT TERM GOALS: Target date: 4th OT Rx visit   Pt will demonstrate understanding of lymphedema precautions and prevention strategies with modified independence using a printed reference to identify at least 5 precautions and discussing how s/he may implement them into daily life to reduce risk of progression with extra time. Baseline:Max A Goal status: INITIAL  2.  Pt will be able to apply multilayer, knee length, gradient, compression wraps to one leg at a time with modified assistance (extra time and assistive device/s) to decrease limb volume, to limit infection risk, and to limit lymphedema progression.  Baseline: Dependent Goal  status: 07/17/23  GOAL MET  LONG TERM GOALS: Target date: 10/05/23  Given this patient's Intake score of 45 % on the functional outcomes FOTO tool, patient will experience an increase in function of 3 points to improve basic and instrumental ADLs performance, including lymphedema self-care.  Baseline: Max A Goal status: INITIAL  2.  Given this patient's Intake score of 82.35% on the Lymphedema Life Impact Scale (LLIS), patient will experience a reduction of at least 5 points in her perceived level of functional impairment resulting from lymphedema to improve functional performance and quality of life (QOL). Baseline: 64.71% Goal status: INITIAL  3.  Pt will achieve at least a 10% volume reduction in LLE to return limb to typical size and shape, to limit infection risk and LE progression, to decrease pain, to improve function. Baseline: Dependent Goal status: INITIAL  4.  Pt will obtain proper compression garments/devices and achieve modified independence (extra time + assistive devices) with donning/doffing to optimize limb volume reductions and limit LE  progression over time. Baseline:  Goal status: INITIAL  5.  During Intensive phase CDT , with modified independence, Pt will achieve at least 85% compliance with all lymphedema self-care home program components, including daily skin care, compression wraps and /or garments, simple self MLD and lymphatic pumping therex to  habituate LE self care protocol  into ADLs for optimal LE self-management over time. Baseline: Dependent Goal status: INITIAL   OBJECTIVE IMPAIRMENTS: Abnormal gait, cardiopulmonary status limiting activity, decreased activity tolerance, decreased endurance, decreased knowledge of condition, decreased knowledge of use of DME, decreased mobility, and difficulty walking.   ACTIVITY LIMITATIONS: carrying, lifting, bending, sitting, standing, squatting, sleeping, stairs, transfers, bathing, dressing, reach over head,  hygiene/grooming, and functional ambulation and mobility.  PARTICIPATION LIMITATIONS: meal prep, cleaning, laundry, interpersonal relationship, driving, shopping, community activity, occupation, and yard work  PERSONAL FACTORS: Behavior pattern, Past/current experiences, Time since onset of injury/illness/exacerbation, and 3+ co morbidities: also affect patient's functional outcome.   REHAB POTENTIAL: Good  EVALUATION COMPLEXITY: Moderate  PLAN: OT FREQUENCY: 2x/week  OT DURATION: 12 weeks and PRN  PLANNED INTERVENTIONS: Therapeutic exercises, Therapeutic activity, Patient/Family education, Self Care, DME instructions, Manual lymph drainage, Compression bandaging, and Manual therapy, customer compression garment and device fitting.  PLAN FOR NEXT SESSION:  Cont teaching LE self care-lymphatic pumping therex MLD Bandaging Skin care    Loel Dubonnet, MS, OTR/L, CLT-LANA 07/31/23 11:02 AM

## 2023-08-03 ENCOUNTER — Other Ambulatory Visit: Payer: Self-pay

## 2023-08-03 DIAGNOSIS — M80059A Age-related osteoporosis with current pathological fracture, unspecified femur, initial encounter for fracture: Secondary | ICD-10-CM

## 2023-08-03 DIAGNOSIS — M810 Age-related osteoporosis without current pathological fracture: Secondary | ICD-10-CM

## 2023-08-04 ENCOUNTER — Ambulatory Visit: Payer: Medicaid Other | Admitting: Occupational Therapy

## 2023-08-05 ENCOUNTER — Ambulatory Visit: Payer: Medicaid Other | Admitting: Dermatology

## 2023-08-06 ENCOUNTER — Ambulatory Visit: Payer: Medicaid Other | Admitting: Occupational Therapy

## 2023-08-06 DIAGNOSIS — F332 Major depressive disorder, recurrent severe without psychotic features: Secondary | ICD-10-CM | POA: Diagnosis not present

## 2023-08-11 ENCOUNTER — Ambulatory Visit: Payer: Medicaid Other | Admitting: Occupational Therapy

## 2023-08-11 DIAGNOSIS — I890 Lymphedema, not elsewhere classified: Secondary | ICD-10-CM

## 2023-08-11 NOTE — Therapy (Signed)
OUTPATIENT OCCUPATIONAL THERAPY NOTE  LOWER EXTREMITY LYMPHEDEMA  Patient Name: Jonathon Snow MRN: [id number] DOB:09/03/1961, 62 y.o., male Today's Date: 08/11/2023  END OF SESSION:   OT End of Session - 08/11/23 1250     Visit Number 8    Number of Visits 36    Date for OT Re-Evaluation 10/05/23    OT Start Time 1100    OT Stop Time 1200    OT Time Calculation (min) 60 min    Activity Tolerance Patient tolerated treatment well;No increased pain   Pt moving constantly in seat and pursed lips bgreathing throughout eval session 2/2 reported back pain. 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   Overview:  superior sagittal sinus, left transverse sinus and cortical veins    COVID-19 virus infection 12/2020   Depression    DVT (deep venous thrombosis) (HCC)    Dyspnea    GERD (gastroesophageal reflux disease)    Heel spur, left 02/16/2019   Heel spur, right 02/16/2019   Herpes zoster infection of lumbar region 02/20/2020   History of kidney stones    Hyperlipidemia    Hypertension    Lupus (HCC)    Lymphedema 10/07/2018   Morbid obesity (HCC)    Opiate abuse, episodic (HCC) 02/26/2018   Osteoporosis    Pneumonia    PONV (postoperative nausea and vomiting)    Postphlebitic syndrome with ulcer, 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   Presence of IVC filter 03/22/2020   Removed   Pulmonary embolism (HCC)    Renal disorder    Stage III   Past Surgical History:  Procedure Laterality Date   ANKLE SURGERY Right    BACK SURGERY     BRONCHIAL WASHINGS N/A 11/01/2021   Procedure: BRONCHIAL WASHINGS;  Surgeon: Vida Rigger, MD;  Location: [facility] ORS;  Service: Thoracic;  Laterality: N/A;   COLONOSCOPY WITH PROPOFOL N/A 05/28/2020   Procedure: COLONOSCOPY WITH PROPOFOL;  Surgeon: Wyline Mood, MD;  Location: [facility]  ENDOSCOPY;  Service: Endoscopy;  Laterality: N/A;   COLONOSCOPY WITH PROPOFOL N/A 07/01/2023   Procedure: COLONOSCOPY WITH PROPOFOL;  Surgeon: Wyline Mood, MD;  Location: [facility] ENDOSCOPY;  Service: Gastroenterology;  Laterality: N/A;   CYST EXCISION  92 or 93    Liver cyst removal [facility]   FLEXIBLE BRONCHOSCOPY N/A 11/01/2021   Procedure: FLEXIBLE BRONCHOSCOPY;  Surgeon: Vida Rigger, MD;  Location: [facility] ORS;  Service: Thoracic;  Laterality: N/A;   HIP PINNING,CANNULATED Left 02/19/2023   Procedure: PERCUTANEOUS FIXATION OF FEMORAL NECK;  Surgeon: Signa Kell, MD;  Location: [facility] ORS;  Service: Orthopedics;  Laterality: Left;   I & D EXTREMITY Right 04/29/2017   Procedure: IRRIGATION AND DEBRIDEMENT EXTREMITY;  Surgeon: Ricarda Frame, MD;  Location: [facility] ORS;  Service: General;  Laterality: Right;   IRRIGATION AND DEBRIDEMENT ABSCESS Left 04/29/2017   Procedure: IRRIGATION AND DEBRIDEMENT Scrotal ABSCESS;  Surgeon: Ricarda Frame, MD;  Location: [facility] ORS;  Service: General;  Laterality: Left;   POLYPECTOMY  07/01/2023   Procedure: POLYPECTOMY;  Surgeon: Wyline Mood, MD;  Location: [facility] ENDOSCOPY;  Service: Gastroenterology;;   Patient Active Problem List   Diagnosis Date Noted   Chronic bilateral low back pain with left-sided sciatica 07/07/2023   Severe episode of recurrent major depressive disorder, 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   Neuropathy  07/07/2023   Hx of colonic polyps 07/01/2023   Adenomatous polyp of colon 07/01/2023   SLE (systemic lupus erythematosus related syndrome) (HCC) 02/19/2023   Closed left hip fracture, initial encounter (HCC) 02/18/2023   Pain and swelling of right lower extremity 02/03/2023   Erectile disorder 01/22/2023   Coronary artery disease of native artery of native heart with stable angina pectoris (HCC) 09/09/2022   Current moderate episode of major depressive disorder (HCC) 09/09/2022   Varicose veins of left lower extremity with  inflammation 01/03/2022   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) 09/18/2021   Prediabetes 11/08/2020   Stage 3a chronic kidney disease (HCC) 11/08/2020   Seasonal allergies 11/08/2020   Rhinosinusitis 11/08/2020   Long term current use of systemic steroids 11/08/2020   Anticoagulant disorder (HCC) 11/07/2020   Senile purpura (HCC) 11/07/2020   History of DVT (deep vein thrombosis) 03/22/2020   MDD (major depressive disorder), recurrent episode, mild (HCC) 09/12/2019   Other spondylosis with radiculopathy, lumbar region 02/16/2019   Osteoporosis 10/12/2018   Chronic venous insufficiency 10/07/2018   Lymphedema 10/07/2018   SLE glomerulonephritis syndrome, 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HCC) 03/03/2018   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) 10/29/2016   Mixed hyperlipidemia 01/15/2016   Primary hypertension 01/15/2016   Obesity (BMI 30.0-34.9) 01/15/2016   Long term current use of anticoagulant 10/04/2015   Systemic lupus erythematosus (HCC) 05/30/2015   COPD, moderate (HCC) 06/27/2014   History of pulmonary embolism 12/11/2013   Nodule of right lung 12/11/2013   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 He presents without compression in place. He does not rate pain numerically. Pt states that he did not take planned trip because he had a few bad days.  PERTINENT HISTORY:  systemic Lupus, HTN, CVI, varicose veins w inflammation, CKD, stage III, Hx DVT, Hx PE, CAD, COPD, L hip Fx 2/24. 2019 IVC filter removed, post phlebitic syndrome w ulcer, ankle Fx-R, back sx, hx scrotal abscess, Major depressive disorder. Medications with swelling side effect include amlodipine (NORVASC) ,  Gabapentin and Prednisone.  PAIN:  Are you having pain? Yes: NPRS scale: 4/10 Pain location: L lower extremity- medial thigh and bottom of foot and toes, back Pain description: burning, tingling ,tight, stabbing, heavy Aggravating factors: walking, standing, extended sitting with dependent positioning Relieving factors: elevation  PRECAUTIONS: Other: LYMPHEDEMA PRECAUTIONS: CARDIAC,  PULMONARY,  FALLS:  Has patient fallen in last 6 months? Yes. Number of falls 1. Resulted in L hip fracture  LIVING ENVIRONMENT: Lives with:  lives alone Lives in: House/apartment Stairs: No; External: 0 steps; none Has following equipment at home: shower chair  PRIOR LEVEL OF FUNCTION: Independent  PATIENT GOALS: "I'd like to get back to the condition I was in before all this started happening to me."   OBJECTIVE:  FOTO functional outcome measure. Intake score: 45%  Lymphedema Life Impact Scale (Lymphedema life Impact Scale) Intake: 82.35%  (The extent to which lymphedema -related problems impacted your life during the last week.)  COGNITION/ MOOD:  Within functional limits for tasks assessed   OBSERVATIONS / OTHER ASSESSMENTS: Mild, stage II, BLE lymphedema 2 CVI and suspected, long term lymphatic overload by systemic fluid accumulation   BLE COMPARATIVE LIMB VOLUMETRICS Initial 07/14/23   LANDMARK RIGHT  (dominant)  R LEG (A-D) 3615.6 ml  R THIGH (E-G) 4542.6 ml  R FULL LIMB (A-G) 8163.2 ml  Limb Volume differential (LVD)  %  Volume change since initial %  Volume change overall V  (Blank rows = not tested)  LANDMARK LEFT  (Rx)  L LEG (A-D) 4526.4 ml  L THIGH (E-G) 6246.3 ml  L FULL  LIMB (A-G) 10822.7 ml  Limb Volume differential (LVD)  Leg LVD = 19.76%, L>R. Thigh LVD = 27.77%, L>R Full limb LVD = 24.6%, L>R  Volume change since initial %  Volume change overall %  (Blank rows = not tested)   TODAY'S TREATMENT:                                                                                                                                         Pt edu LE self care LLE/LLQ  MLD LLE multilayer  gradient compression wrapping- toes to groin  PATIENT EDUCATION: Pt edu re differences between basic vaso pneumatic pump that squeezes and releases the leg in a retrograde fashion from distal to proximal and stops at the knee or groin, and an advanced sequential compression device aimed at facilitating lymphatic fluid return through regional LN and lymphatic pathways. Provided manufacturer info for [employer] if he would like to explore. This OT no longer works with [employer] due to customer services issues. Person educated: Patient  Education method: Explanation, Demonstration, and Handouts Education comprehension: verbalized understanding, returned demonstration, verbal cues required, and needs further education   HOME EXERCISE PROGRAM: At least 2 x daily BLE Lymphatic Pumping There ex Daily BLE/BLQ Skin care to limit infection risk and increase skin excursion Compression  Intensive stage compression: multilayer short stretch wraps with gradient techniques. One limb at a time. Thigh length, multi-layer gradient compression wrap to L leg from base of toes to groin using 1 each 8, 10 and 12 cm wide short stretch bandages over single layer of Rosidal foam on the leg in circumferential pattern, and 2 additional 12 cm wide bandages over Rosidal  to groin.  Self-management Phase Compression: Custom, flat knit, BLE compression knee highs. Consider Elvarex classic for daytime and Jobst Relax for HOS  Custom-made gradient compression garments and HOS devices are medically necessary in  this case because they are uniquely sized and shaped to fit the exact dimensions of the affected extremities with deformities, and to provide accurate and consistent gradient compression and containment, essential to optimally managing this patient's symptoms of chronic, progressive lymphedema. Multiple custom compression garments are needed for optimal hygiene to limit infection risk. Custom compression garments should be replaced q 3-6 months When worn consistently for optimal lipo-lymphedema self-management over time.  ASSESSMENT: CLINICAL IMPRESSION:  LLE is more swollen than last visit on 07/31/23. Pt is not getting in for OT  at 2 x week recommended frequency and it's unclear if he is using compression wraps daily as directed without OT support and that accountability that comes with more frequent visits. Provided Pt edu re difference between basic pneumatic compression pump and an advanced sequential device. We Continued MLD to LLE/ LLQ as established using functional inguinal LN, deep abdominal lymphatics to terminus. Pt tolerated manual therapy without increased pain. Applied gradient compression wraps from base of toes to groin.  Requested scheduler increase back to 2 x weekly OT frequency for LE care.   SHORT TERM GOALS: Target date: 4th OT Rx visit   Pt will demonstrate understanding of lymphedema precautions and prevention strategies with modified independence using a printed reference to identify at least 5 precautions and discussing how s/he may implement them into daily life to reduce risk of progression with extra time. Baseline:Max A Goal status: INITIAL  2.  Pt will be able to apply multilayer, knee length, gradient, compression wraps to one leg at a time with modified assistance (extra time and assistive device/s) to decrease limb volume, to limit infection risk, and to limit lymphedema progression.  Baseline: Dependent Goal status: 07/17/23  GOAL MET  LONG TERM GOALS: Target date:  10/05/23  Given this patient's Intake score of 45 % on the functional outcomes FOTO tool, patient will experience an increase in function of 3 points to improve basic and instrumental ADLs performance, including lymphedema self-care.  Baseline: Max A Goal status: INITIAL  2.  Given this patient's Intake score of 82.35% on the Lymphedema Life Impact Scale (LLIS), patient will experience a reduction of at least 5 points in her perceived level of functional impairment resulting from lymphedema to improve functional performance and quality of life (QOL). Baseline: 64.71% Goal status: INITIAL  3.  Pt will achieve at least a 10% volume reduction in LLE to return limb to typical size and shape, to limit infection risk and LE progression, to decrease pain, to improve function. Baseline: Dependent Goal status: INITIAL  4.  Pt will obtain proper compression garments/devices and achieve modified independence (extra time + assistive devices) with donning/doffing to optimize limb volume reductions and limit LE  progression over time. Baseline:  Goal status: INITIAL  5.  During Intensive phase CDT , with modified independence, Pt will achieve at least 85% compliance with all lymphedema self-care home program components,  including daily skin care, compression wraps and /or garments, simple self MLD and lymphatic pumping therex to habituate LE self care protocol  into ADLs for optimal LE self-management over time. Baseline: Dependent Goal status: INITIAL   OBJECTIVE IMPAIRMENTS: Abnormal gait, cardiopulmonary status limiting activity, decreased activity tolerance, decreased endurance, decreased knowledge of condition, decreased knowledge of use of DME, decreased mobility, and difficulty walking.   ACTIVITY LIMITATIONS: carrying, lifting, bending, sitting, standing, squatting, sleeping, stairs, transfers, bathing, dressing, reach over head, hygiene/grooming, and functional ambulation and  mobility.  PARTICIPATION LIMITATIONS: meal prep, cleaning, laundry, interpersonal relationship, driving, shopping, community activity, occupation, and yard work  PERSONAL FACTORS: Behavior pattern, Past/current experiences, Time since onset of injury/illness/exacerbation, and 3+ co morbidities: also affect patient's functional outcome.   REHAB POTENTIAL: Good  EVALUATION COMPLEXITY: Moderate  PLAN: OT FREQUENCY: 2x/week  OT DURATION: 12 weeks and PRN  PLANNED INTERVENTIONS: Therapeutic exercises, Therapeutic activity, Patient/Family education, Self Care, DME instructions, Manual lymph drainage, Compression bandaging, and Manual therapy, customer compression garment and device fitting.  PLAN FOR NEXT SESSION:  Cont teaching LE self care-lymphatic pumping therex MLD Bandaging Skin care    Loel Dubonnet, MS, OTR/L, CLT-LANA 08/11/23 12:55 PM

## 2023-08-12 DIAGNOSIS — M5416 Radiculopathy, lumbar region: Secondary | ICD-10-CM | POA: Diagnosis not present

## 2023-08-12 DIAGNOSIS — M545 Low back pain, unspecified: Secondary | ICD-10-CM | POA: Diagnosis not present

## 2023-08-13 ENCOUNTER — Encounter: Payer: Medicaid Other | Admitting: Occupational Therapy

## 2023-08-13 DIAGNOSIS — F332 Major depressive disorder, recurrent severe without psychotic features: Secondary | ICD-10-CM | POA: Diagnosis not present

## 2023-08-14 ENCOUNTER — Ambulatory Visit (INDEPENDENT_AMBULATORY_CARE_PROVIDER_SITE_OTHER): Payer: Medicaid Other | Admitting: Vascular Surgery

## 2023-08-14 ENCOUNTER — Encounter (INDEPENDENT_AMBULATORY_CARE_PROVIDER_SITE_OTHER): Payer: Self-pay | Admitting: Vascular Surgery

## 2023-08-14 VITALS — BP 151/94 | HR 80 | Resp 18 | Ht 74.0 in | Wt 236.2 lb

## 2023-08-14 DIAGNOSIS — E782 Mixed hyperlipidemia: Secondary | ICD-10-CM

## 2023-08-14 DIAGNOSIS — I872 Venous insufficiency (chronic) (peripheral): Secondary | ICD-10-CM | POA: Diagnosis not present

## 2023-08-14 DIAGNOSIS — N1831 Chronic kidney disease, stage 3a: Secondary | ICD-10-CM

## 2023-08-14 DIAGNOSIS — I10 Essential (primary) hypertension: Secondary | ICD-10-CM

## 2023-08-14 DIAGNOSIS — I890 Lymphedema, not elsewhere classified: Secondary | ICD-10-CM

## 2023-08-14 NOTE — Assessment & Plan Note (Signed)
Patient has hyperpigmentation, swelling, and pain despite appropriate use of compression socks, elevation, and exercise for many months.  He has been going to the lymphedema clinic.  He would clearly benefit from the lymphedema pump and we will try to get that approved again for him.  Follow-up in 6 months.

## 2023-08-14 NOTE — Progress Notes (Signed)
MRN : [id number]  Jonathon Snow is a 62 y.o. (12-12-1961) male who presents with chief complaint of  Chief Complaint  Patient presents with   Follow-up    fu in 4-6 months with no studies  .  History of Present Illness: Patient returns today in follow up of lymphedema and leg swelling.  His swelling is actually under fairly good control at this point.  He wears his compression socks every day.  He elevates his legs.  He has been getting manual wraps and these are working fairly well.  He still has not yet received his lymphedema pump which would clearly be an excellent adjuvant therapy for his stage II lymphedema.   Current Outpatient Medications  Medication Sig Dispense Refill   albuterol (VENTOLIN HFA) 108 (90 Base) MCG/ACT inhaler Inhale 2 puffs into the lungs every 6 (six) hours as needed for wheezing or shortness of breath. 18 g 2   amLODipine (NORVASC) 5 MG tablet Take 1 tablet (5 mg total) by mouth daily. 90 tablet 1   apixaban (ELIQUIS) 5 MG TABS tablet Take 1 tablet (5 mg total) by mouth 2 (two) times daily. 60 tablet 6   ARIPiprazole (ABILIFY) 2 MG tablet      cholecalciferol (VITAMIN D3) 25 MCG (1000 UNIT) tablet Take 1,000 Units by mouth daily.     gabapentin (NEURONTIN) 300 MG capsule Take 1 capsule by mouth 3 (three) times daily.     hydrOXYzine (ATARAX) 25 MG tablet Take 25 mg by mouth 2 (two) times daily.     levocetirizine (XYZAL) 5 MG tablet Take 1 tablet (5 mg total) by mouth every evening. 30 tablet 5   LORazepam (ATIVAN) 0.5 MG tablet Take 1 tablet (0.5 mg total) by mouth 2 (two) times daily as needed for anxiety. 20 tablet 0   losartan (COZAAR) 100 MG tablet Take 1 tablet (100 mg total) by mouth daily. 90 tablet 1   montelukast (SINGULAIR) 10 MG tablet Take 1 tablet (10 mg total) by mouth at bedtime. 90 tablet 3   Multiple Vitamins-Minerals (MULTIVITAMIN WITH MINERALS) tablet Take 1 tablet by mouth daily.     mycophenolate (CELLCEPT) 500 MG tablet Take 1,000 mg  by mouth 2 (two) times daily.     predniSONE (DELTASONE) 10 MG tablet Take 1 tablet by mouth daily.     rosuvastatin (CRESTOR) 10 MG tablet Take 1 tablet (10 mg total) by mouth daily. 90 tablet 3   sertraline (ZOLOFT) 100 MG tablet Take 1 tablet (100 mg total) by mouth daily. 30 tablet 3   tadalafil (CIALIS) 20 MG tablet Take 1 tablet (20 mg total) by mouth daily as needed for erectile dysfunction. 30 tablet 11   traMADol (ULTRAM) 50 MG tablet Take 1 tablet (50 mg total) by mouth every 6 (six) hours as needed for moderate pain. 30 tablet 0   No current facility-administered medications for this visit.    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   Overview:  superior sagittal sinus, left transverse sinus and cortical veins    COVID-19 virus infection 12/2020   Depression    DVT (deep venous thrombosis) (HCC)    Dyspnea    GERD (gastroesophageal reflux disease)    Heel spur, left 02/16/2019   Heel spur, right 02/16/2019   Herpes zoster infection of lumbar region 02/20/2020   History of kidney stones    Hyperlipidemia    Hypertension  Lupus (HCC)    Lymphedema 10/07/2018   Morbid obesity (HCC)    Opiate abuse, episodic (HCC) 02/26/2018   Osteoporosis    Pneumonia    PONV (postoperative nausea and vomiting)    Postphlebitic syndrome with ulcer, 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   Presence of IVC filter 03/22/2020   Removed   Pulmonary embolism (HCC)    Renal disorder    Stage III    Past Surgical History:  Procedure Laterality Date   ANKLE SURGERY Right    BACK SURGERY     BRONCHIAL WASHINGS N/A 11/01/2021   Procedure: BRONCHIAL WASHINGS;  Surgeon: Vida Rigger, MD;  Location: [facility];  Service: Thoracic;  Laterality: N/A;   COLONOSCOPY WITH PROPOFOL N/A 05/28/2020   Procedure: COLONOSCOPY WITH PROPOFOL;  Surgeon: Wyline Mood, MD;  Location: [facility] ENDOSCOPY;  Service: Endoscopy;  Laterality: N/A;    COLONOSCOPY WITH PROPOFOL N/A 07/01/2023   Procedure: COLONOSCOPY WITH PROPOFOL;  Surgeon: Wyline Mood, MD;  Location: [facility] ENDOSCOPY;  Service: Gastroenterology;  Laterality: N/A;   CYST EXCISION  92 or 93    Liver cyst removal [facility]   FLEXIBLE BRONCHOSCOPY N/A 11/01/2021   Procedure: FLEXIBLE BRONCHOSCOPY;  Surgeon: Vida Rigger, MD;  Location: [facility];  Service: Thoracic;  Laterality: N/A;   HIP PINNING,CANNULATED Left 02/19/2023   Procedure: PERCUTANEOUS FIXATION OF FEMORAL NECK;  Surgeon: Signa Kell, MD;  Location: [facility];  Service: Orthopedics;  Laterality: Left;   I & D EXTREMITY Right 04/29/2017   Procedure: IRRIGATION AND DEBRIDEMENT EXTREMITY;  Surgeon: Ricarda Frame, MD;  Location: [facility];  Service: General;  Laterality: Right;   IRRIGATION AND DEBRIDEMENT ABSCESS Left 04/29/2017   Procedure: IRRIGATION AND DEBRIDEMENT Scrotal ABSCESS;  Surgeon: Ricarda Frame, MD;  Location: [facility];  Service: General;  Laterality: Left;   POLYPECTOMY  07/01/2023   Procedure: POLYPECTOMY;  Surgeon: Wyline Mood, MD;  Location: [facility] ENDOSCOPY;  Service: Gastroenterology;;     Social History   Tobacco Use   Smoking status: Never   Smokeless tobacco: Never  Vaping Use   Vaping status: Never Used  Substance Use Topics   Alcohol use: No   Drug use: No    Comment: PT DENIES       Family History  Problem Relation Age of Onset   Hypertension Father    Heart disease Father    Clotting disorder Mother    Kidney disease Brother    Heart attack Maternal Grandmother    Heart attack Maternal Grandfather    Heart attack Paternal Grandfather      Allergies  Allergen Reactions   Enalapril Other (See Comments)    Unknown reaction   Vicodin [Hydrocodone-Acetaminophen] Hives and Rash    Severe headaches (also) NAME BRAND ONLY PER PT CAN TAKE GENERIC     REVIEW OF SYSTEMS (Negative unless checked)   Constitutional: [] Weight loss  [] Fever  [] Chills Cardiac: [] Chest pain    [] Chest pressure   [] Palpitations   [] Shortness of breath when laying flat   [] Shortness of breath at rest   [] Shortness of breath with exertion. Vascular:  [] Pain in legs with walking   [] Pain in legs at rest   [] Pain in legs when laying flat   [] Claudication   [] Pain in feet when walking  [] Pain in feet at rest  [] Pain in feet when laying flat   [x] History of DVT   [] Phlebitis   [x] Swelling in legs   [x] Varicose veins   [] Non-healing ulcers Pulmonary:   [] Uses home oxygen   []   Productive cough   [] Hemoptysis   [] Wheeze  [] COPD   [] Asthma Neurologic:  [] Dizziness  [] Blackouts   [] Seizures   [] History of stroke   [] History of TIA  [] Aphasia   [] Temporary blindness   [] Dysphagia   [] Weakness or numbness in arms   [] Weakness or numbness in legs Musculoskeletal:  [x] Arthritis   [] Joint swelling   [] Joint pain   [] Low back pain Hematologic:  [] Easy bruising  [] Easy bleeding   [] Hypercoagulable state   [] Anemic   Gastrointestinal:  [] Blood in stool   [] Vomiting blood  [] Gastroesophageal reflux/heartburn   [] Abdominal pain Genitourinary:  [x] Chronic kidney disease   [] Difficult urination  [] Frequent urination  [] Burning with urination   [] Hematuria Skin:  [] Rashes   [] Ulcers   [] Wounds Psychological:  [] History of anxiety   []  History of major depression.  Physical Examination  BP (!) 151/94 (BP Location: Left Arm)   Pulse 80   Resp 18   Ht 6\' 2"  (1.88 m)   Wt 236 lb 3.2 oz (107.1 kg)   BMI 30.33 kg/m  Gen:  WD/WN, NAD Head: [place]/AT, No temporalis wasting. Ear/Nose/Throat: Hearing grossly intact, nares w/o erythema or drainage Eyes: Conjunctiva clear. Sclera non-icteric Neck: Supple.  Trachea midline Pulmonary:  Good air movement, no use of accessory muscles.  Cardiac: RRR, no JVD Vascular:  Vessel Right Left  Radial Palpable Palpable               Musculoskeletal: M/S 5/5 throughout.  No deformity or atrophy.  Hyperpigmentation present bilaterally.  1+ bilateral lower extremity  edema. Neurologic: Sensation grossly intact in extremities.  Symmetrical.  Speech is fluent.  Psychiatric: Judgment intact, Mood & affect appropriate for pt's clinical situation. Dermatologic: No rashes or ulcers noted.  No cellulitis or open wounds.      Labs Recent Results (from the past 2160 hour(s))  DRUG MONITOR, PANEL 1, W/CONF, URINE     Status: None   Collection Time: 07/07/23 11:36 AM  Result Value Ref Range   Amphetamines NEGATIVE <500 ng/mL   Barbiturates NEGATIVE <300 ng/mL   Benzodiazepines NEGATIVE <100 ng/mL   Cocaine Metabolite NEGATIVE <150 ng/mL   Marijuana Metabolite NEGATIVE <20 ng/mL   Methadone Metabolite NEGATIVE <100 ng/mL   Opiates NEGATIVE <100 ng/mL   Oxycodone NEGATIVE <100 ng/mL   Phencyclidine NEGATIVE <25 ng/mL   Creatinine 92.0 > or = 20.0 mg/dL   pH 5.6 4.5 - 9.0   Oxidant NEGATIVE <200 mcg/mL  DM TEMPLATE     Status: None   Collection Time: 07/07/23 11:36 AM  Result Value Ref Range   Notes and Comments      Comment: This drug testing is for medical treatment only. Analysis was performed as non-forensic testing and these results should be used only by healthcare providers to render diagnosis or treatment, or to monitor progress of medical conditions. . . Healthcare Providers needing Interpretation assistance,  please contact us at [phone] ([phone])  M-F, 8am to 10pm EST    Patient Historical Report      Comment: ????    Radiology DG Bone Density  Result Date: 07/20/2023 EXAM: DUAL X-RAY ABSORPTIOMETRY (DXA) FOR BONE MINERAL DENSITY IMPRESSION: Your patient Yardley Minser completed a BMD test on 07/20/2023 using the [employer] iDXA DXA System (software version: 14.10) manufactured by [employer]. The following summarizes the results of our evaluation. Technologist: [facility] PATIENT BIOGRAPHICAL: Name: Zaylan, Woosley Patient ID: [id number] Birth Date: 1961/05/22 Height: 74.0 in. Gender: Male Exam Date: 07/20/2023 Weight:  233.1 lbs. Indications: Caucasian, Chronic Glucocorticoids, History of Spinal Surgery, Kidney Disease, Long Term Prednisone Use, Lupus, Parent Hip Fracture Fractures: Ankle Right, fingers, hip left Treatments: Prednisone, Vitamin D DENSITOMETRY RESULTS: Site         Region     Measured Date Measured Age WHO Classification Young Adult T-score BMD         %Change vs. Previous Significant Change (*) AP Spine L1-L2 07/20/[age].6 Osteopenia -1.2 1.064 g/cm2 Right Femur Total 07/20/2023 61.6 Osteoporosis -3.2 0.643 g/cm2 Left Forearm Radius 33% 07[age]20/2023 61.6 Osteopenia -2.3 0.759 g/cm2 ASSESSMENT: The BMD measured at Femur Total is 0.643 g/cm2 with a T-score of -3.2. This patient is considered Osteoporosis according to the [facility]) criteria. The scan quality is good. L-3 & 4 was excluded due to degenerative changes. Left femur was excluded due to surgical hardware. [employer] writer [facility]) criteria for post-menopausal, Caucasian Women: Normal:                   T-score at or above -1 SD Osteopenia/low bone mass: T-score between -1 and -2.5 SD Osteoporosis:             T-score at or below -2.5 SD RECOMMENDATIONS: 1. All patients should optimize calcium and vitamin D intake. 2. Consider FDA-approved medical therapies in postmenopausal women and men aged 15 years and older, based on the following: a. A hip or vertebral(clinical or morphometric) fracture b. T-score < -2.5 at the femoral neck or spine after appropriate evaluation to exclude secondary causes c. Low bone mass (T-score between -1.0 and -2.5 at the femoral neck or spine) and a 10-year probability of a hip fracture > 3% or a 10-year probability of a major osteoporosis-related fracture > 20% based on the [place]-adapted WHO algorithm 3. Clinician judgment and/or patient preferences may indicate treatment for people with 10-year fracture probabilities above or below these levels FOLLOW-UP: People with diagnosed cases of osteoporosis or  osteopenia should be regularly tested for bone mineral density. For patients eligible for Medicare, routine testing is allowed once every 2 years. The testing frequency can be increased to one year for patients who have rapidly progressing disease, or for those who are receiving medical therapy to restore bone mass. I have reviewed this report, and agree with the above findings. [facility] Electronically Signed   By: Baird Lyons M.D.   On: 07/20/2023 10:38    Assessment/Plan Benign essential hypertension blood pressure control important in reducing the progression of atherosclerotic disease. On appropriate oral medications.     Stage 3a chronic kidney disease (HCC) May worsen LE swelling   Mixed hyperlipidemia lipid control important in reducing the progression of atherosclerotic disease. Continue statin therapy  Chronic venous insufficiency Previous history of DVT and venous ablation as well.  Recent duplex does not show acute DVT despite initial reports from an outside duplex that suggested it might.  He is already on anticoagulation chronically.  Continue compression socks, elevation, and referral to the lymphedema clinic to manage his lymphedema which has developed.  Lymphedema Patient has hyperpigmentation, swelling, and pain despite appropriate use of compression socks, elevation, and exercise for many months.  He has been going to the lymphedema clinic.  He would clearly benefit from the lymphedema pump and we will try to get that approved again for him.  Follow-up in 6 months.    Festus Barren, MD  08/14/2023 12:42 PM    This note was created with Dragon medical transcription system.  Any errors from dictation are purely unintentional

## 2023-08-17 DIAGNOSIS — M545 Low back pain, unspecified: Secondary | ICD-10-CM | POA: Diagnosis not present

## 2023-08-17 DIAGNOSIS — M5416 Radiculopathy, lumbar region: Secondary | ICD-10-CM | POA: Diagnosis not present

## 2023-08-18 ENCOUNTER — Ambulatory Visit: Payer: Medicaid Other | Admitting: Occupational Therapy

## 2023-08-18 DIAGNOSIS — I890 Lymphedema, not elsewhere classified: Secondary | ICD-10-CM | POA: Diagnosis not present

## 2023-08-18 DIAGNOSIS — M1652 Unilateral post-traumatic osteoarthritis, left hip: Secondary | ICD-10-CM | POA: Diagnosis not present

## 2023-08-18 NOTE — Therapy (Signed)
OUTPATIENT OCCUPATIONAL THERAPY NOTE  LOWER EXTREMITY LYMPHEDEMA  Patient Name: Jonathon Snow MRN: [id number] DOB:February 28, 1961, 62 y.o., male Today's Date: 08/18/2023  END OF SESSION:   OT End of Session - 08/18/23 1113     Visit Number 9    Number of Visits 36    Date for OT Re-Evaluation 10/05/23    OT Start Time 1110    OT Stop Time 1210    OT Time Calculation (min) 60 min    Activity Tolerance Patient tolerated treatment well;No increased pain   Pt moving constantly in seat and pursed lips bgreathing throughout eval session 2/2 reported back pain. Not rated numerically   Behavior During Therapy [facility] for tasks assessed/performed              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   Overview:  superior sagittal sinus, left transverse sinus and cortical veins    COVID-19 virus infection 12/2020   Depression    DVT (deep venous thrombosis) (HCC)    Dyspnea    GERD (gastroesophageal reflux disease)    Heel spur, left 02/16/2019   Heel spur, right 02/16/2019   Herpes zoster infection of lumbar region 02/20/2020   History of kidney stones    Hyperlipidemia    Hypertension    Lupus (HCC)    Lymphedema 10/07/2018   Morbid obesity (HCC)    Opiate abuse, episodic (HCC) 02/26/2018   Osteoporosis    Pneumonia    PONV (postoperative nausea and vomiting)    Postphlebitic syndrome with ulcer, 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   Presence of IVC filter 03/22/2020   Removed   Pulmonary embolism (HCC)    Renal disorder    Stage III   Past Surgical History:  Procedure Laterality Date   ANKLE SURGERY Right    BACK SURGERY     BRONCHIAL WASHINGS N/A 11/01/2021   Procedure: BRONCHIAL WASHINGS;  Surgeon: Vida Rigger, MD;  Location: [facility] ORS;  Service: Thoracic;  Laterality: N/A;   COLONOSCOPY WITH PROPOFOL N/A 05/28/2020   Procedure: COLONOSCOPY WITH PROPOFOL;  Surgeon: Wyline Mood, MD;  Location: [facility]  ENDOSCOPY;  Service: Endoscopy;  Laterality: N/A;   COLONOSCOPY WITH PROPOFOL N/A 07/01/2023   Procedure: COLONOSCOPY WITH PROPOFOL;  Surgeon: Wyline Mood, MD;  Location: [facility] ENDOSCOPY;  Service: Gastroenterology;  Laterality: N/A;   CYST EXCISION  92 or 93    Liver cyst removal [facility]   FLEXIBLE BRONCHOSCOPY N/A 11/01/2021   Procedure: FLEXIBLE BRONCHOSCOPY;  Surgeon: Vida Rigger, MD;  Location: [facility] ORS;  Service: Thoracic;  Laterality: N/A;   HIP PINNING,CANNULATED Left 02/19/2023   Procedure: PERCUTANEOUS FIXATION OF FEMORAL NECK;  Surgeon: Signa Kell, MD;  Location: [facility] ORS;  Service: Orthopedics;  Laterality: Left;   I & D EXTREMITY Right 04/29/2017   Procedure: IRRIGATION AND DEBRIDEMENT EXTREMITY;  Surgeon: Ricarda Frame, MD;  Location: [facility] ORS;  Service: General;  Laterality: Right;   IRRIGATION AND DEBRIDEMENT ABSCESS Left 04/29/2017   Procedure: IRRIGATION AND DEBRIDEMENT Scrotal ABSCESS;  Surgeon: Ricarda Frame, MD;  Location: [facility] ORS;  Service: General;  Laterality: Left;   POLYPECTOMY  07/01/2023   Procedure: POLYPECTOMY;  Surgeon: Wyline Mood, MD;  Location: [facility] ENDOSCOPY;  Service: Gastroenterology;;   Patient Active Problem List   Diagnosis Date Noted   Chronic bilateral low back pain with left-sided sciatica 07/07/2023   Severe episode of recurrent major depressive disorder, 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   Neuropathy  07/07/2023   Hx of colonic polyps 07/01/2023   Adenomatous polyp of colon 07/01/2023   SLE (systemic lupus erythematosus related syndrome) (HCC) 02/19/2023   Closed left hip fracture, initial encounter (HCC) 02/18/2023   Pain and swelling of right lower extremity 02/03/2023   Erectile disorder 01/22/2023   Coronary artery disease of native artery of native heart with stable angina pectoris (HCC) 09/09/2022   Current moderate episode of major depressive disorder (HCC) 09/09/2022   Varicose veins of left lower extremity with  inflammation 01/03/2022   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) 09/18/2021   Prediabetes 11/08/2020   Stage 3a chronic kidney disease (HCC) 11/08/2020   Seasonal allergies 11/08/2020   Rhinosinusitis 11/08/2020   Long term current use of systemic steroids 11/08/2020   Anticoagulant disorder (HCC) 11/07/2020   Senile purpura (HCC) 11/07/2020   History of DVT (deep vein thrombosis) 03/22/2020   MDD (major depressive disorder), recurrent episode, mild (HCC) 09/12/2019   Other spondylosis with radiculopathy, lumbar region 02/16/2019   Osteoporosis 10/12/2018   Chronic venous insufficiency 10/07/2018   Lymphedema 10/07/2018   SLE glomerulonephritis syndrome, 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HCC) 03/03/2018   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) 10/29/2016   Mixed hyperlipidemia 01/15/2016   Primary hypertension 01/15/2016   Obesity (BMI 30.0-34.9) 01/15/2016   Long term current use of anticoagulant 10/04/2015   Systemic lupus erythematosus (HCC) 05/30/2015   COPD, moderate (HCC) 06/27/2014   History of pulmonary embolism 12/11/2013   Nodule of right lung 12/11/2013   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 He presents without compression in place. He does not rate pain numerically. Pt states he is wrapping between visits and sees  the volume of his R leg going down slowly but  steadily.  PERTINENT HISTORY: systemic Lupus, HTN, CVI, varicose veins w inflammation, CKD, stage III, Hx DVT, Hx PE, CAD, COPD, L hip Fx 2/24. 2019 IVC filter removed, post phlebitic syndrome w ulcer, ankle Fx-R, back sx, hx scrotal abscess, Major depressive disorder. Medications with swelling side  effect include amlodipine (NORVASC) , Gabapentin and Prednisone.  PAIN:  Are you having pain? Yes: NPRS scale: 4/10 Pain location: L lower extremity- medial thigh and bottom of foot and toes, back Pain description: burning, tingling ,tight, stabbing, heavy Aggravating factors: walking, standing, extended sitting with dependent positioning Relieving factors: elevation  PRECAUTIONS: Other: LYMPHEDEMA PRECAUTIONS: CARDIAC,  PULMONARY,  FALLS:  Has patient fallen in last 6 months? Yes. Number of falls 1. Resulted in L hip fracture  LIVING ENVIRONMENT: Lives with:  lives alone Lives in: House/apartment Stairs: No; External: 0 steps; none Has following equipment at home: shower chair  PRIOR LEVEL OF FUNCTION: Independent  PATIENT GOALS: "I'd like to get back to the condition I was in before all this started happening to me."   OBJECTIVE:  FOTO functional outcome measure. Intake score: 45%  Lymphedema Life Impact Scale (Lymphedema life Impact Scale) Intake: 82.35%  (The extent to which lymphedema -related problems impacted your life during the last week.)  COGNITION/ MOOD:  Within functional limits for tasks assessed   OBSERVATIONS / OTHER ASSESSMENTS: Mild, stage II, BLE lymphedema 2 CVI and suspected, long term lymphatic overload by systemic fluid accumulation   BLE COMPARATIVE LIMB VOLUMETRICS Initial 07/14/23   LANDMARK RIGHT  (dominant)  R LEG (A-D) 3615.6 ml  R THIGH (E-G) 4542.6 ml  R FULL LIMB (A-G) 8163.2 ml  Limb Volume differential (LVD)  %  Volume change since initial %  Volume change overall V  (Blank rows = not tested)  LANDMARK LEFT  (Rx)  L LEG (A-D) 4526.4 ml   L THIGH (E-G) 6246.3 ml  L FULL LIMB (A-G) 10822.7 ml  Limb Volume differential (LVD)  Leg LVD = 19.76%, L>R. Thigh LVD = 27.77%, L>R Full limb LVD = 24.6%, L>R  Volume change since initial %  Volume change overall %  (Blank rows = not tested)   RLE Volumetrics VISIT 10 :TBA next session for progress report  LANDMARK RIGHT  (dominant; Rx))  R LEG (A-D) ml  R THIGH (E-G)  ml  R FULL LIMB (A-G)  ml  Limb Volume differential (LVD)  %  Volume change since initial %  Volume change overall V  (Blank rows = not tested)    TODAY'S TREATMENT:                                                                                                                                         Pt edu LE importance of following POC and Rx frequency for best outcome; Began discussing compression garment options  LLE/LLQ  MLD and simultaneous skin care LLE multilayer  gradient compression wrapping- toes to groin  PATIENT EDUCATION: Continued Pt/ CG edu for lymphedema self care home program throughout session. Topics include outcome of comparative limb volumetrics- starting limb volume differentials (LVDs), technology and gradient techniques used for short stretch, multilayer compression wrapping, simple self-MLD, therapeutic lymphatic  pumping exercises, skin/nail care, LE precautions,. compression garment recommendations and specifications, wear and care schedule and compression garment donning / doffing w assistive devices. Discussed progress towards all OT goals since commencing CDT. All questions answered to the Pt's satisfaction. Good return.  Person educated: Patient  Education method: Explanation, Demonstration, and Handouts Education comprehension: verbalized understanding, returned demonstration, verbal cues required, and needs further education   HOME EXERCISE PROGRAM: At least 2 x daily BLE Lymphatic Pumping There ex Daily BLE/BLQ Skin care to limit infection risk and increase skin  excursion Compression Intensive stage compression: multilayer short stretch wraps with gradient techniques. One limb at a time. Thigh length, multi-layer gradient compression wrap to L leg from base of toes to groin using 1 each 8, 10 and 12 cm wide short stretch bandages over single layer of Rosidal foam on the leg in circumferential pattern, and 2 additional 12 cm wide bandages over Rosidal  to groin.  Self-management Phase Compression: Custom, flat knit, BLE compression knee highs. Consider Elvarex classic for daytime and Jobst Relax for HOS  Custom-made gradient compression garments and HOS devices are medically necessary in this case because they are uniquely sized and shaped to fit the exact dimensions of the affected extremities with deformities, and to provide accurate and consistent gradient compression and containment, essential to optimally managing this patient's symptoms of chronic, progressive lymphedema. Multiple custom compression garments are needed for optimal hygiene to limit infection risk. Custom compression garments should be replaced q 3-6 months When worn consistently for optimal lipo-lymphedema self-management over time.  ASSESSMENT: CLINICAL IMPRESSION:  LLE swelling appears well controlled with obvious reduction in volume since last session.  We Continued MLD to LLE/ LLQ as established using functional inguinal LN, deep abdominal lymphatics to terminus. Pt tolerated manual therapy without increased pain. Applied gradient compression wraps from base of toes to groin.  Began discussing options and recommendations for compression garments. Pt open to the idea of adding compression shorts over L thigh high garment to move edema beyond the inguinal LN and towards deeper abdominal LN. We'll discuss more next visit and explore garment manufacturer's offerings for these.  SHORT TERM GOALS: Target date: 4th OT Rx visit   Pt will demonstrate understanding of lymphedema precautions and  prevention strategies with modified independence using a printed reference to identify at least 5 precautions and discussing how s/he may implement them into daily life to reduce risk of progression with extra time. Baseline:Max A Goal status: INITIAL  2.  Pt will be able to apply multilayer, knee length, gradient, compression wraps to one leg at a time with modified assistance (extra time and assistive device/s) to decrease limb volume, to limit infection risk, and to limit lymphedema progression.  Baseline: Dependent Goal status: 07/17/23  GOAL MET  LONG TERM GOALS: Target date: 10/05/23  Given this patient's Intake score of 45 % on the functional outcomes FOTO tool, patient will experience an increase in function of 3 points to improve basic and instrumental ADLs performance, including lymphedema self-care.  Baseline: Max A Goal status: INITIAL  2.  Given this patient's Intake score of 82.35% on the Lymphedema Life Impact Scale (LLIS), patient will experience a reduction of at least 5 points in her perceived level of functional impairment resulting from lymphedema to improve functional performance and quality of life (QOL). Baseline: 64.71% Goal status: INITIAL  3.  Pt will achieve at least a 10% volume reduction in LLE to return limb to typical size and shape, to limit  infection risk and LE progression, to decrease pain, to improve function. Baseline: Dependent Goal status: INITIAL  4.  Pt will obtain proper compression garments/devices and achieve modified independence (extra time + assistive devices) with donning/doffing to optimize limb volume reductions and limit LE  progression over time. Baseline:  Goal status: INITIAL  5.  During Intensive phase CDT , with modified independence, Pt will achieve at least 85% compliance with all lymphedema self-care home program components, including daily skin care, compression wraps and /or garments, simple self MLD and lymphatic pumping therex to  habituate LE self care protocol  into ADLs for optimal LE self-management over time. Baseline: Dependent Goal status: INITIAL   OBJECTIVE IMPAIRMENTS: Abnormal gait, cardiopulmonary status limiting activity, decreased activity tolerance, decreased endurance, decreased knowledge of condition, decreased knowledge of use of DME, decreased mobility, and difficulty walking.   ACTIVITY LIMITATIONS: carrying, lifting, bending, sitting, standing, squatting, sleeping, stairs, transfers, bathing, dressing, reach over head, hygiene/grooming, and functional ambulation and mobility.  PARTICIPATION LIMITATIONS: meal prep, cleaning, laundry, interpersonal relationship, driving, shopping, community activity, occupation, and yard work  PERSONAL FACTORS: Behavior pattern, Past/current experiences, Time since onset of injury/illness/exacerbation, and 3+ co morbidities: also affect patient's functional outcome.   REHAB POTENTIAL: Good  EVALUATION COMPLEXITY: Moderate  PLAN: OT FREQUENCY: 2x/week  OT DURATION: 12 weeks and PRN  PLANNED INTERVENTIONS: Therapeutic exercises, Therapeutic activity, Patient/Family education, Self Care, DME instructions, Manual lymph drainage, Compression bandaging, and Manual therapy, customer compression garment and device fitting.  PLAN FOR NEXT SESSION:  LLE comparative limb volumetrics Multilayer gradient compression wraps -toes to groin Pt edu re LE self-care- garment options and recommendations    Loel Dubonnet, MS, OTR/L, CLT-LANA 08/18/23 12:20 PM

## 2023-08-19 DIAGNOSIS — I890 Lymphedema, not elsewhere classified: Secondary | ICD-10-CM | POA: Diagnosis not present

## 2023-08-20 ENCOUNTER — Encounter: Payer: Medicaid Other | Admitting: Occupational Therapy

## 2023-08-25 ENCOUNTER — Encounter: Payer: Self-pay | Admitting: Occupational Therapy

## 2023-08-25 ENCOUNTER — Ambulatory Visit: Payer: Medicaid Other | Attending: Vascular Surgery | Admitting: Occupational Therapy

## 2023-08-25 DIAGNOSIS — I890 Lymphedema, not elsewhere classified: Secondary | ICD-10-CM | POA: Insufficient documentation

## 2023-08-25 NOTE — Therapy (Unsigned)
OUTPATIENT OCCUPATIONAL THERAPY TREATMENT NOTE AND PROGRESS REPORT  LOWER EXTREMITY LYMPHEDEMA  Patient Name: Jonathon Snow MRN: [id number] DOB:[birth year], 62 y.o., male Today's Date: 08/25/2023  REPORTING PERIOD: 07/07/23 - 08/25/23  END OF SESSION:   OT End of Session - 08/25/23 1109     Visit Number 10    Number of Visits 36    Date for OT Re-Evaluation 10/05/23    OT Start Time 1103    OT Stop Time 1206    OT Time Calculation (min) 63 min    Activity Tolerance Patient tolerated treatment well;No increased pain   Pt moving constantly in seat and pursed lips bgreathing throughout eval session 2/2 reported back pain. 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   Overview:  superior sagittal sinus, left transverse sinus and cortical veins    COVID-19 virus infection 12/2020   Depression    DVT (deep venous thrombosis) (HCC)    Dyspnea    GERD (gastroesophageal reflux disease)    Heel spur, left 02/16/2019   Heel spur, right 02/16/2019   Herpes zoster infection of lumbar region 02/20/2020   History of kidney stones    Hyperlipidemia    Hypertension    Lupus (HCC)    Lymphedema 10/07/2018   Morbid obesity (HCC)    Opiate abuse, episodic (HCC) 02/26/2018   Osteoporosis    Pneumonia    PONV (postoperative nausea and vomiting)    Postphlebitic syndrome with ulcer, 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   Presence of IVC filter 03/22/2020   Removed   Pulmonary embolism (HCC)    Renal disorder    Stage III   Past Surgical History:  Procedure Laterality Date   ANKLE SURGERY Right    BACK SURGERY     BRONCHIAL WASHINGS N/A 11/01/2021   Procedure: BRONCHIAL WASHINGS;  Surgeon: Vida Rigger, MD;  Location: [facility] ORS;  Service: Thoracic;  Laterality: N/A;   COLONOSCOPY WITH PROPOFOL N/A 05/28/2020   Procedure:  COLONOSCOPY WITH PROPOFOL;  Surgeon: Wyline Mood, MD;  Location: [facility] ENDOSCOPY;  Service: Endoscopy;  Laterality: N/A;   COLONOSCOPY WITH PROPOFOL N/A 07/01/2023   Procedure: COLONOSCOPY WITH PROPOFOL;  Surgeon: Wyline Mood, MD;  Location: [facility] ENDOSCOPY;  Service: Gastroenterology;  Laterality: N/A;   CYST EXCISION  92 or 93    Liver cyst removal [facility]   FLEXIBLE BRONCHOSCOPY N/A 11/01/2021   Procedure: FLEXIBLE BRONCHOSCOPY;  Surgeon: Vida Rigger, MD;  Location: [facility] ORS;  Service: Thoracic;  Laterality: N/A;   HIP PINNING,CANNULATED Left 02/19/2023   Procedure: PERCUTANEOUS FIXATION OF FEMORAL NECK;  Surgeon: Signa Kell, MD;  Location: [facility] ORS;  Service: Orthopedics;  Laterality: Left;   I & D EXTREMITY Right 04/29/2017   Procedure: IRRIGATION AND DEBRIDEMENT EXTREMITY;  Surgeon: Ricarda Frame, MD;  Location: [facility] ORS;  Service: General;  Laterality: Right;   IRRIGATION AND DEBRIDEMENT ABSCESS Left 04/29/2017   Procedure: IRRIGATION AND DEBRIDEMENT Scrotal ABSCESS;  Surgeon: Ricarda Frame, MD;  Location: [facility] ORS;  Service: General;  Laterality: Left;   POLYPECTOMY  07/01/2023   Procedure: POLYPECTOMY;  Surgeon: Wyline Mood, MD;  Location: [facility] ENDOSCOPY;  Service: Gastroenterology;;   Patient Active Problem List   Diagnosis Date Noted   Chronic bilateral low back pain with left-sided sciatica 07/07/2023   Severe episode of recurrent major  depressive disorder, 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   Neuropathy 07/07/2023   Hx of colonic polyps 07/01/2023   Adenomatous polyp of colon 07/01/2023   SLE (systemic lupus erythematosus related syndrome) (HCC) 02/19/2023   Closed left hip fracture, initial encounter (HCC) 02/18/2023   Pain and swelling of right lower extremity 02/03/2023   Erectile disorder 01/22/2023   Coronary artery disease of native artery of native heart with stable angina pectoris (HCC) 09/09/2022   Current moderate episode of major depressive disorder  (HCC) 09/09/2022   Varicose 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 01/03/2022   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) 09/18/2021   Prediabetes 11/08/2020   Stage 3a chronic kidney disease (HCC) 11/08/2020   Seasonal allergies 11/08/2020   Rhinosinusitis 11/08/2020   Long term current use of systemic steroids 11/08/2020   Anticoagulant disorder (HCC) 11/07/2020   Senile purpura (HCC) 11/07/2020   History of DVT (deep vein thrombosis) 03/22/2020   MDD (major depressive disorder), recurrent episode, mild (HCC) 09/12/2019   Other spondylosis with radiculopathy, lumbar region 02/16/2019   Osteoporosis 10/12/2018   Chronic venous insufficiency 10/07/2018   Lymphedema 10/07/2018   SLE glomerulonephritis syndrome, 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HCC) 03/03/2018   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) 10/29/2016   Mixed hyperlipidemia 01/15/2016   Primary hypertension 01/15/2016   Obesity (BMI 30.0-34.9) 01/15/2016   Long term current use of anticoagulant 10/04/2015   Systemic lupus erythematosus (HCC) 05/30/2015   COPD, moderate (HCC) 06/27/2014   History of pulmonary embolism 12/11/2013   Nodule of right lung 12/11/2013   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 He presents without compression in place. He does not rate pain numerically. Pt states, although he is diligent  with wrapping between visits, his leg feels more swollen today than usual. Pt reports did eat salty snacks yesterday while watching sports on TV.   PERTINENT HISTORY: systemic Lupus, HTN, CVI, varicose veins w inflammation, CKD, stage III, Hx DVT, Hx PE, CAD, COPD, L hip Fx 2/24. 2019 IVC filter  removed, post phlebitic syndrome w ulcer, ankle Fx-R, back sx, hx scrotal abscess, Major depressive disorder. Medications with swelling side effect include amlodipine (NORVASC) , Gabapentin and Prednisone.  PAIN:  Are you having pain? Yes: NPRS scale:  not rated numerically/10 Pain location: L lower extremity- medial thigh and bottom of foot and toes, back Pain description: burning, tingling ,tight, stabbing, heavy Aggravating factors: walking, standing, extended sitting with dependent positioning Relieving factors: elevation  PRECAUTIONS: Other: LYMPHEDEMA PRECAUTIONS: CARDIAC,  PULMONARY,  FALLS:  Has patient fallen in last 6 months? Yes. Number of falls 1. Resulted in L hip fracture  LIVING ENVIRONMENT: Lives with:  lives alone Lives in: House/apartment Stairs: No; External: 0 steps; none Has following equipment at home: shower chair  PRIOR LEVEL OF FUNCTION: Independent  PATIENT GOALS: "I'd like to get back to the condition I was in before all this started happening to me."   OBJECTIVE:  FOTO functional outcome measure. Intake score: 45%  Lymphedema Life Impact Scale (Lymphedema life Impact Scale) Intake: 82.35%  (The extent to which lymphedema -related problems impacted your life during the last week.)  COGNITION/ MOOD:  Within functional limits for tasks assessed   OBSERVATIONS / OTHER ASSESSMENTS: Mild, stage II, BLE lymphedema 2 CVI and suspected, long term lymphatic overload by systemic fluid accumulation   BLE COMPARATIVE LIMB VOLUMETRICS Initial 07/14/23   LANDMARK RIGHT  (dominant)  R LEG (A-D) 3615.6 ml  R THIGH (E-G) 4542.6 ml  R FULL LIMB (A-G) 8163.2 ml  Limb Volume  differential (LVD)  %  Volume change since initial %  Volume change overall V  (Blank rows = not tested)  LANDMARK LEFT  (Rx)  L LEG (A-D) 4526.4 ml  L THIGH (E-G) 6246.3 ml  L FULL LIMB (A-G) 10822.7 ml  Limb Volume differential (LVD)  Leg LVD = 19.76%, L>R. Thigh LVD = 27.77%, L>R Full limb LVD = 24.6%, L>R  Volume change since initial %  Volume change overall %  (Blank rows = not tested)   LLE Volumetrics VISIT 10 :08/25/23  LANDMARK LEFT (Rx)  L LEG (A-D) 4230.5 ml  L THIGH (E-G) 5797.9  ml  L FULL LIMB (A-G) 10028.4  ml  Limb Volume differential (LVD)  %  Volume change since last measured on 07/14/23 The L LEG is decreased in volume by 4.54 % since commencing OT for CDT on 07/14/23. The L THIGH is decreased in volume by 7.9%, and the overall L FULL LIMB reduction measures 7.3%.   Volume change overall V  (Blank rows = not tested)    TODAY'S TREATMENT:                                                                                                                                         Pt edu re compression garment options and recommendations Pt edu re outcome of volumetrics and reviewed progress towards goals to  date. LLE multilayer  gradient compression wrapping- toes to groin  PATIENT EDUCATION: Continued Pt/ CG edu for lymphedema self care home program throughout session. Topics include outcome of comparative limb volumetrics- starting limb volume differentials (LVDs), technology and gradient techniques used for short stretch, multilayer compression wrapping, simple self-MLD, therapeutic lymphatic pumping exercises, skin/nail care, LE precautions,. compression garment recommendations and specifications, wear and care schedule and compression garment donning / doffing w assistive devices. Discussed progress towards all OT goals since commencing CDT. All questions answered to the Pt's satisfaction. Good return.  Person educated: Patient  Education method: Explanation,  Demonstration, and Handouts Education comprehension: verbalized understanding, returned demonstration, verbal cues required, and needs further education   HOME EXERCISE PROGRAM: At least 2 x daily BLE Lymphatic Pumping There ex Daily BLE/BLQ Skin care to limit infection risk and increase skin excursion Compression Intensive stage compression: multilayer short stretch wraps with gradient techniques. One limb at a time. Thigh length, multi-layer gradient compression wrap to L leg from base of toes to groin using 1 each 8, 10 and 12 cm wide short stretch bandages over single layer of Rosidal foam on the leg in circumferential pattern, and 2 additional 12 cm wide bandages over Rosidal  to groin.  Self-management Phase Compression: Custom, flat knit, BLE compression knee highs. Consider Elvarex classic for daytime and Jobst Relax for HOS  Custom-made gradient compression garments and HOS devices are medically necessary in this case because they are uniquely sized and shaped to fit the exact dimensions of the affected extremities with deformities, and to provide accurate and consistent gradient compression and containment, essential to optimally managing this patient's symptoms of chronic, progressive lymphedema. Multiple custom compression garments are needed for optimal hygiene to limit infection risk. Custom compression garments should be replaced q 3-6 months When worn consistently for optimal lipo-lymphedema self-management over time.  ASSESSMENT: CLINICAL IMPRESSION:  Reviewed progress to date, including LLE comparative limb volumetrics measured and calculated today. The L LEG is decreased in volume by 4.54 % since commencing OT for CDT on 07/14/23. The L THIGH is decreased in volume by 7.9%, and the overall L FULL LIMB reduction measures 7.3%. While these values do not yet reach the 10% volume reduction goal they significant and demonstrate good progress and Pt's compliance with LE self-care home  program. Pt demonstrates steady progress towards all OT goals for lymphedema care and self management. Pleas refer to GOALS section below for detailed information. Pt has nearly exhausted 11 initially approved OT visits for treatment of firmly ensconced, chronic, progressive, incurable lymphedema, and additional visits are medically necessary for optimal reduction of swelling, to limit infection risk, to facilitate improved functional   ambulation and transfers, and to limit progression and further functional decline. Without comprehensive lymphedema care and self-care training, LE will progress and further functional decline is expected. Cont 2 x weekly as per POC.   SHORT TERM GOALS: Target date: 4th OT Rx visit   Pt will demonstrate understanding of lymphedema precautions and prevention strategies with modified independence using a printed reference to identify at least 5 precautions and discussing how s/he may implement them into daily life to reduce risk of progression with extra time. Baseline:Max A Goal status:08/25/23 GOAL MET  2.  Pt will be able to apply multilayer, knee length, gradient, compression wraps to one leg at a time with modified assistance (extra time and assistive device/s) to decrease limb volume, to limit infection risk, and to limit lymphedema progression.  Baseline: Dependent Goal  status: 07/17/23  GOAL MET  LONG TERM GOALS: Target date: 10/05/23  Given this patient's Intake score of 45 % on the functional outcomes FOTO tool, patient will experience an increase in function of 3 points to improve basic and instrumental ADLs performance, including lymphedema self-care.  Baseline: Max A Goal status: 08/25/23 PROGRESSING  2.  Given this patient's Intake score of 82.35% on the Lymphedema Life Impact Scale (LLIS), patient will experience a reduction of at least 5 points in her perceived level of functional impairment resulting from lymphedema to improve functional performance and  quality of life (QOL). Baseline: 64.71% Goal status: 08/25/23 PROGRESSING  3.  Pt will achieve at least a 10% volume reduction in LLE to return limb to typical size and shape, to limit infection risk and LE progression, to decrease pain, to improve function. Baseline: Dependent Goal status: 08/25/23 PROGRESSING: The L LEG is decreased in volume by 4.54 % since commencing OT for CDT on 07/14/23. The L THIGH is decreased in volume by 7.9%, and the overall L FULL LIMB reduction measures 7.3%.  4.  Pt will obtain proper compression garments/devices and achieve modified independence (extra time + assistive devices) with donning/doffing to optimize limb volume reductions and limit LE  progression over time. Baseline:  Goal status:08/25/23 PROGRESSING:  5.  During Intensive phase CDT , with modified independence, Pt will achieve at least 85% compliance with all lymphedema self-care home program components, including daily skin care, compression wraps and /or garments, simple self MLD and lymphatic pumping therex to habituate LE self care protocol  into ADLs for optimal LE self-management over time. Baseline: Dependent Goal status: 08/25/23 PROGRESSING:  OBJECTIVE IMPAIRMENTS: Abnormal gait, cardiopulmonary status limiting activity, decreased activity tolerance, decreased endurance, decreased knowledge of condition, decreased knowledge of use of DME, decreased mobility, and difficulty walking.   ACTIVITY LIMITATIONS: carrying, lifting, bending, sitting, standing, squatting, sleeping, stairs, transfers, bathing, dressing, reach over head, hygiene/grooming, and functional ambulation and mobility.  PARTICIPATION LIMITATIONS: meal prep, cleaning, laundry, interpersonal relationship, driving, shopping, community activity, occupation, and yard work  PERSONAL FACTORS: Behavior pattern, Past/current experiences, Time since onset of injury/illness/exacerbation, and 3+ co morbidities: also affect patient's functional  outcome.   REHAB POTENTIAL: Good  EVALUATION COMPLEXITY: Moderate  PLAN: OT FREQUENCY: 2x/week  OT DURATION: 12 weeks and PRN  PLANNED INTERVENTIONS: Therapeutic exercises, Therapeutic activity, Patient/Family education, Self Care, DME instructions, Manual lymph drainage, Compression bandaging, and Manual therapy, customer compression garment and device fitting.  PLAN FOR NEXT SESSION:  Complete custom compression garment and device specifications and measurements. Send to DME vendor for reauthorization and processing LLE Multilayer gradient compression wraps -toes to groin Pt edu re LE self-care- garment options and recommendations  Loel Dubonnet, MS, OTR/L, CLT-LANA 08/25/23 12:46 PM

## 2023-08-25 NOTE — Therapy (Deleted)
OUTPATIENT OCCUPATIONAL THERAPY NOTE AND PROGRESS REPORT  LOWER EXTREMITY LYMPHEDEMA  Patient Name: ANIKEN STOLLINGS MRN: [id number] DOB:1961/09/02, 62 y.o., male Today's Date: 08/25/2023  REPORTING PERIOD: 07/07/23 - 08/25/23  END OF SESSION:   OT End of Session - 08/25/23 1109     Visit Number 10    Number of Visits 36    Date for OT Re-Evaluation 10/05/23    OT Start Time 1103    Activity Tolerance Patient tolerated treatment well;No increased pain   Pt moving constantly in seat and pursed lips bgreathing throughout eval session 2/2 reported back pain. Not rated numerically   Behavior During Therapy [facility] for tasks assessed/performed              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   Overview:  superior sagittal sinus, left transverse sinus and cortical veins    COVID-19 virus infection 12/2020   Depression    DVT (deep venous thrombosis) (HCC)    Dyspnea    GERD (gastroesophageal reflux disease)    Heel spur, left 02/16/2019   Heel spur, right 02/16/2019   Herpes zoster infection of lumbar region 02/20/2020   History of kidney stones    Hyperlipidemia    Hypertension    Lupus (HCC)    Lymphedema 10/07/2018   Morbid obesity (HCC)    Opiate abuse, episodic (HCC) 02/26/2018   Osteoporosis    Pneumonia    PONV (postoperative nausea and vomiting)    Postphlebitic syndrome with ulcer, 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   Presence of IVC filter 03/22/2020   Removed   Pulmonary embolism (HCC)    Renal disorder    Stage III   Past Surgical History:  Procedure Laterality Date   ANKLE SURGERY Right    BACK SURGERY     BRONCHIAL WASHINGS N/A 11/01/2021   Procedure: BRONCHIAL WASHINGS;  Surgeon: Vida Rigger, MD;  Location: [facility] ORS;  Service: Thoracic;  Laterality: N/A;   COLONOSCOPY WITH PROPOFOL N/A 05/28/2020   Procedure: COLONOSCOPY WITH PROPOFOL;  Surgeon: Wyline Mood, MD;  Location: [facility]  ENDOSCOPY;  Service: Endoscopy;  Laterality: N/A;   COLONOSCOPY WITH PROPOFOL N/A 07/01/2023   Procedure: COLONOSCOPY WITH PROPOFOL;  Surgeon: Wyline Mood, MD;  Location: [facility] ENDOSCOPY;  Service: Gastroenterology;  Laterality: N/A;   CYST EXCISION  92 or 93    Liver cyst removal [facility]   FLEXIBLE BRONCHOSCOPY N/A 11/01/2021   Procedure: FLEXIBLE BRONCHOSCOPY;  Surgeon: Vida Rigger, MD;  Location: [facility] ORS;  Service: Thoracic;  Laterality: N/A;   HIP PINNING,CANNULATED Left 02/19/2023   Procedure: PERCUTANEOUS FIXATION OF FEMORAL NECK;  Surgeon: Signa Kell, MD;  Location: [facility] ORS;  Service: Orthopedics;  Laterality: Left;   I & D EXTREMITY Right 04/29/2017   Procedure: IRRIGATION AND DEBRIDEMENT EXTREMITY;  Surgeon: Ricarda Frame, MD;  Location: [facility] ORS;  Service: General;  Laterality: Right;   IRRIGATION AND DEBRIDEMENT ABSCESS Left 04/29/2017   Procedure: IRRIGATION AND DEBRIDEMENT Scrotal ABSCESS;  Surgeon: Ricarda Frame, MD;  Location: [facility] ORS;  Service: General;  Laterality: Left;   POLYPECTOMY  07/01/2023   Procedure: POLYPECTOMY;  Surgeon: Wyline Mood, MD;  Location: [facility] ENDOSCOPY;  Service: Gastroenterology;;   Patient Active Problem List   Diagnosis Date Noted   Chronic bilateral low back pain with left-sided sciatica 07/07/2023   Severe episode of recurrent major depressive disorder, 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   Neuropathy 07/07/2023   Hx of colonic polyps  07/01/2023   Adenomatous polyp of colon 07/01/2023   SLE (systemic lupus erythematosus related syndrome) (HCC) 02/19/2023   Closed left hip fracture, initial encounter (HCC) 02/18/2023   Pain and swelling of right lower extremity 02/03/2023   Erectile disorder 01/22/2023   Coronary artery disease of native artery of native heart with stable angina pectoris (HCC) 09/09/2022   Current moderate episode of major depressive disorder (HCC) 09/09/2022   Varicose veins of left lower extremity with  inflammation 01/03/2022   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) 09/18/2021   Prediabetes 11/08/2020   Stage 3a chronic kidney disease (HCC) 11/08/2020   Seasonal allergies 11/08/2020   Rhinosinusitis 11/08/2020   Long term current use of systemic steroids 11/08/2020   Anticoagulant disorder (HCC) 11/07/2020   Senile purpura (HCC) 11/07/2020   History of DVT (deep vein thrombosis) 03/22/2020   MDD (major depressive disorder), recurrent episode, mild (HCC) 09/12/2019   Other spondylosis with radiculopathy, lumbar region 02/16/2019   Osteoporosis 10/12/2018   Chronic venous insufficiency 10/07/2018   Lymphedema 10/07/2018   SLE glomerulonephritis syndrome, 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HCC) 03/03/2018   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) 10/29/2016   Mixed hyperlipidemia 01/15/2016   Primary hypertension 01/15/2016   Obesity (BMI 30.0-34.9) 01/15/2016   Long term current use of anticoagulant 10/04/2015   Systemic lupus erythematosus (HCC) 05/30/2015   COPD, moderate (HCC) 06/27/2014   History of pulmonary embolism 12/11/2013   Nodule of right lung 12/11/2013   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   Cystic disease of liver 08/21/2013    PCP: Danelle Berry, PA-C  REFERRING PROVIDER: Festus Barren, MD  REFERRING DIAG: I89.0  THERAPY DIAG:  Lymphedema, not elsewhere classified  Rationale for Evaluation and Treatment: Habilitation  ONSET DATE: 2013 s/p DVT  SUBJECTIVE:                                                                                                                                                                                           SUBJECTIVE STATEMENT: [name] presents to Occupational Therapy for treatment of LLE lymphedema. He presents without compression in place. He does not rate pain numerically. Pt states he is wrapping between visits and sees  the volume of his R leg going down slowly but  steadily.  PERTINENT HISTORY: systemic Lupus, HTN, CVI, varicose veins w inflammation, CKD, stage III, Hx DVT, Hx PE, CAD, COPD, L hip Fx 2/24. 2019 IVC filter removed, post phlebitic syndrome w ulcer, ankle Fx-R, back sx, hx scrotal abscess, Major depressive disorder. Medications with swelling side effect include amlodipine (NORVASC) , Gabapentin and  Prednisone.  PAIN:  Are you having pain? Yes: NPRS scale: 4/10 Pain location: L lower extremity- medial thigh and bottom of foot and toes, back Pain description: burning, tingling ,tight, stabbing, heavy Aggravating factors: walking, standing, extended sitting with dependent positioning Relieving factors: elevation  PRECAUTIONS: Other: LYMPHEDEMA PRECAUTIONS: CARDIAC,  PULMONARY,  FALLS:  Has patient fallen in last 6 months? Yes. Number of falls 1. Resulted in L hip fracture  LIVING ENVIRONMENT: Lives with:  lives alone Lives in: House/apartment Stairs: No; External: 0 steps; none Has following equipment at home: shower chair  PRIOR LEVEL OF FUNCTION: Independent  PATIENT GOALS: "I'd like to get back to the condition I was in before all this started happening to me."   OBJECTIVE:  FOTO functional outcome measure. Intake score: 45%  Lymphedema Life Impact Scale (Lymphedema life Impact Scale) Intake: 82.35%  (The extent to which lymphedema -related problems impacted your life during the last week.)  COGNITION/ MOOD:  Within functional limits for tasks assessed   OBSERVATIONS / OTHER ASSESSMENTS: Mild, stage II, BLE lymphedema 2 CVI and suspected, long term lymphatic overload by systemic fluid accumulation   BLE COMPARATIVE LIMB VOLUMETRICS Initial 07/14/23   LANDMARK RIGHT  (dominant)  R LEG (A-D) 3615.6 ml  R THIGH (E-G) 4542.6 ml  R FULL LIMB (A-G) 8163.2 ml  Limb Volume differential (LVD)  %  Volume change since initial %  Volume change overall V  (Blank rows = not tested)  LANDMARK LEFT  (Rx)  L LEG (A-D) 4526.4 ml   L THIGH (E-G) 6246.3 ml  L FULL LIMB (A-G) 10822.7 ml  Limb Volume differential (LVD)  Leg LVD = 19.76%, L>R. Thigh LVD = 27.77%, L>R Full limb LVD = 24.6%, L>R  Volume change since initial %  Volume change overall %  (Blank rows = not tested)   RLE Volumetrics VISIT 10 : 08/25/23  LANDMARK LEFT  (Rx)  L LEG (A-D) ml  L THIGH (E-G)  ml  L FULL LIMB (A-G)  ml  Limb Volume differential (LVD)  %  Volume change since initial %  Volume change overall V  (Blank rows = not tested)    TODAY'S TREATMENT:                                                                                                                                         Pt edu LE importance of following POC and Rx frequency for best outcome; Began discussing compression garment options  LLE/LLQ  MLD and simultaneous skin care LLE multilayer  gradient compression wrapping- toes to groin  PATIENT EDUCATION: Continued Pt/ CG edu for lymphedema self care home program throughout session. Topics include outcome of comparative limb volumetrics- starting limb volume differentials (LVDs), technology and gradient techniques used for short stretch, multilayer compression wrapping, simple self-MLD, therapeutic lymphatic pumping exercises, skin/nail care, LE precautions,. compression garment recommendations and specifications, wear  and care schedule and compression garment donning / doffing w assistive devices. Discussed progress towards all OT goals since commencing CDT. All questions answered to the Pt's satisfaction. Good return.  Person educated: Patient  Education method: Explanation, Demonstration, and Handouts Education comprehension: verbalized understanding, returned demonstration, verbal cues required, and needs further education   HOME EXERCISE PROGRAM: At least 2 x daily BLE Lymphatic Pumping There ex Daily BLE/BLQ Skin care to limit infection risk and increase skin excursion Compression Intensive stage compression:  multilayer short stretch wraps with gradient techniques. One limb at a time. Thigh length, multi-layer gradient compression wrap to L leg from base of toes to groin using 1 each 8, 10 and 12 cm wide short stretch bandages over single layer of Rosidal foam on the leg in circumferential pattern, and 2 additional 12 cm wide bandages over Rosidal  to groin.  Self-management Phase Compression: Custom, flat knit, BLE compression knee highs. Consider Elvarex classic for daytime and Jobst Relax for HOS  Custom-made gradient compression garments and HOS devices are medically necessary in this case because they are uniquely sized and shaped to fit the exact dimensions of the affected extremities with deformities, and to provide accurate and consistent gradient compression and containment, essential to optimally managing this patient's symptoms of chronic, progressive lymphedema. Multiple custom compression garments are needed for optimal hygiene to limit infection risk. Custom compression garments should be replaced q 3-6 months When worn consistently for optimal lipo-lymphedema self-management over time.  ASSESSMENT: CLINICAL IMPRESSION:  LLE swelling appears well controlled with obvious reduction in volume since last session.  We Continued MLD to LLE/ LLQ as established using functional inguinal LN, deep abdominal lymphatics to terminus. Pt tolerated manual therapy without increased pain. Applied gradient compression wraps from base of toes to groin.  Began discussing options and recommendations for compression garments. Pt open to the idea of adding compression shorts over L thigh high garment to move edema beyond the inguinal LN and towards deeper abdominal LN. We'll discuss more next visit and explore garment manufacturer's offerings for these.  SHORT TERM GOALS: Target date: 4th OT Rx visit   Pt will demonstrate understanding of lymphedema precautions and prevention strategies with modified independence using  a printed reference to identify at least 5 precautions and discussing how s/he may implement them into daily life to reduce risk of progression with extra time. Baseline:Max A Goal status: INITIAL  2.  Pt will be able to apply multilayer, knee length, gradient, compression wraps to one leg at a time with modified assistance (extra time and assistive device/s) to decrease limb volume, to limit infection risk, and to limit lymphedema progression.  Baseline: Dependent Goal status: 07/17/23  GOAL MET  LONG TERM GOALS: Target date: 10/05/23  Given this patient's Intake score of 45 % on the functional outcomes FOTO tool, patient will experience an increase in function of 3 points to improve basic and instrumental ADLs performance, including lymphedema self-care.  Baseline: Max A Goal status: INITIAL  2.  Given this patient's Intake score of 82.35% on the Lymphedema Life Impact Scale (LLIS), patient will experience a reduction of at least 5 points in her perceived level of functional impairment resulting from lymphedema to improve functional performance and quality of life (QOL). Baseline: 64.71% Goal status: INITIAL  3.  Pt will achieve at least a 10% volume reduction in LLE to return limb to typical size and shape, to limit infection risk and LE progression, to decrease pain, to improve function. Baseline:  Dependent Goal status: INITIAL  4.  Pt will obtain proper compression garments/devices and achieve modified independence (extra time + assistive devices) with donning/doffing to optimize limb volume reductions and limit LE  progression over time. Baseline:  Goal status: INITIAL  5.  During Intensive phase CDT , with modified independence, Pt will achieve at least 85% compliance with all lymphedema self-care home program components, including daily skin care, compression wraps and /or garments, simple self MLD and lymphatic pumping therex to habituate LE self care protocol  into ADLs for optimal  LE self-management over time. Baseline: Dependent Goal status: INITIAL   OBJECTIVE IMPAIRMENTS: Abnormal gait, cardiopulmonary status limiting activity, decreased activity tolerance, decreased endurance, decreased knowledge of condition, decreased knowledge of use of DME, decreased mobility, and difficulty walking.   ACTIVITY LIMITATIONS: carrying, lifting, bending, sitting, standing, squatting, sleeping, stairs, transfers, bathing, dressing, reach over head, hygiene/grooming, and functional ambulation and mobility.  PARTICIPATION LIMITATIONS: meal prep, cleaning, laundry, interpersonal relationship, driving, shopping, community activity, occupation, and yard work  PERSONAL FACTORS: Behavior pattern, Past/current experiences, Time since onset of injury/illness/exacerbation, and 3+ co morbidities: also affect patient's functional outcome.   REHAB POTENTIAL: Good  EVALUATION COMPLEXITY: Moderate  PLAN: OT FREQUENCY: 2x/week  OT DURATION: 12 weeks and PRN  PLANNED INTERVENTIONS: Therapeutic exercises, Therapeutic activity, Patient/Family education, Self Care, DME instructions, Manual lymph drainage, Compression bandaging, and Manual therapy, customer compression garment and device fitting.  PLAN FOR NEXT SESSION:  LLE comparative limb volumetrics Multilayer gradient compression wraps -toes to groin Pt edu re LE self-care- garment options and recommendations    Loel Dubonnet, MS, OTR/L, CLT-LANA 08/25/23 11:11 AM

## 2023-08-26 ENCOUNTER — Other Ambulatory Visit: Payer: Self-pay | Admitting: Orthopedic Surgery

## 2023-08-26 DIAGNOSIS — M533 Sacrococcygeal disorders, not elsewhere classified: Secondary | ICD-10-CM | POA: Diagnosis not present

## 2023-08-26 DIAGNOSIS — M7062 Trochanteric bursitis, left hip: Secondary | ICD-10-CM

## 2023-08-27 DIAGNOSIS — F332 Major depressive disorder, recurrent severe without psychotic features: Secondary | ICD-10-CM | POA: Diagnosis not present

## 2023-08-28 ENCOUNTER — Ambulatory Visit: Payer: Medicaid Other | Admitting: Occupational Therapy

## 2023-08-28 DIAGNOSIS — I890 Lymphedema, not elsewhere classified: Secondary | ICD-10-CM

## 2023-08-28 DIAGNOSIS — M5416 Radiculopathy, lumbar region: Secondary | ICD-10-CM | POA: Diagnosis not present

## 2023-08-28 DIAGNOSIS — M545 Low back pain, unspecified: Secondary | ICD-10-CM | POA: Diagnosis not present

## 2023-08-28 NOTE — Therapy (Signed)
OUTPATIENT OCCUPATIONAL THERAPY TREATMENT NOTE AND PROGRESS REPORT  LOWER EXTREMITY LYMPHEDEMA  Patient Name: Jonathon Snow MRN: [id number] DOB:05-31-1961, 61 y.o., male Today's Date: 08/28/2023  REPORTING PERIOD: 07/07/23 - 08/25/23  END OF SESSION:   OT End of Session - 08/28/23 1206     Visit Number 11    Number of Visits 36    Date for OT Re-Evaluation 10/05/23    OT Start Time 1010    OT Stop Time 1130    OT Time Calculation (min) 80 min    Activity Tolerance Patient tolerated treatment well;No increased pain   Pt moving constantly in seat and pursed lips bgreathing throughout eval session 2/2 reported back pain. Not rated numerically   Behavior During Therapy [facility] for tasks assessed/performed              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   Overview:  superior sagittal sinus, left transverse sinus and cortical veins    COVID-19 virus infection 12/2020   Depression    DVT (deep venous thrombosis) (HCC)    Dyspnea    GERD (gastroesophageal reflux disease)    Heel spur, left 02/16/2019   Heel spur, right 02/16/2019   Herpes zoster infection of lumbar region 02/20/2020   History of kidney stones    Hyperlipidemia    Hypertension    Lupus (HCC)    Lymphedema 10/07/2018   Morbid obesity (HCC)    Opiate abuse, episodic (HCC) 02/26/2018   Osteoporosis    Pneumonia    PONV (postoperative nausea and vomiting)    Postphlebitic syndrome with ulcer, 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   Presence of IVC filter 03/22/2020   Removed   Pulmonary embolism (HCC)    Renal disorder    Stage III   Past Surgical History:  Procedure Laterality Date   ANKLE SURGERY Right    BACK SURGERY     BRONCHIAL WASHINGS N/A 11/01/2021   Procedure: BRONCHIAL WASHINGS;  Surgeon: Vida Rigger, MD;  Location: [facility] ORS;  Service: Thoracic;  Laterality: N/A;   COLONOSCOPY WITH PROPOFOL N/A 05/28/2020   Procedure:  COLONOSCOPY WITH PROPOFOL;  Surgeon: Wyline Mood, MD;  Location: [facility] ENDOSCOPY;  Service: Endoscopy;  Laterality: N/A;   COLONOSCOPY WITH PROPOFOL N/A 07/01/2023   Procedure: COLONOSCOPY WITH PROPOFOL;  Surgeon: Wyline Mood, MD;  Location: [facility] ENDOSCOPY;  Service: Gastroenterology;  Laterality: N/A;   CYST EXCISION  92 or 93    Liver cyst removal [facility]   FLEXIBLE BRONCHOSCOPY N/A 11/01/2021   Procedure: FLEXIBLE BRONCHOSCOPY;  Surgeon: Vida Rigger, MD;  Location: [facility] ORS;  Service: Thoracic;  Laterality: N/A;   HIP PINNING,CANNULATED Left 02/19/2023   Procedure: PERCUTANEOUS FIXATION OF FEMORAL NECK;  Surgeon: Signa Kell, MD;  Location: [facility] ORS;  Service: Orthopedics;  Laterality: Left;   I & D EXTREMITY Right 04/29/2017   Procedure: IRRIGATION AND DEBRIDEMENT EXTREMITY;  Surgeon: Ricarda Frame, MD;  Location: [facility] ORS;  Service: General;  Laterality: Right;   IRRIGATION AND DEBRIDEMENT ABSCESS Left 04/29/2017   Procedure: IRRIGATION AND DEBRIDEMENT Scrotal ABSCESS;  Surgeon: Ricarda Frame, MD;  Location: [facility] ORS;  Service: General;  Laterality: Left;   POLYPECTOMY  07/01/2023   Procedure: POLYPECTOMY;  Surgeon: Wyline Mood, MD;  Location: [facility] ENDOSCOPY;  Service: Gastroenterology;;   Patient Active Problem List   Diagnosis Date Noted   Chronic bilateral low back pain with left-sided sciatica 07/07/2023   Severe episode of recurrent major  depressive disorder, 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   Neuropathy 07/07/2023   Hx of colonic polyps 07/01/2023   Adenomatous polyp of colon 07/01/2023   SLE (systemic lupus erythematosus related syndrome) (HCC) 02/19/2023   Closed left hip fracture, initial encounter (HCC) 02/18/2023   Pain and swelling of right lower extremity 02/03/2023   Erectile disorder 01/22/2023   Coronary artery disease of native artery of native heart with stable angina pectoris (HCC) 09/09/2022   Current moderate episode of major depressive disorder  (HCC) 09/09/2022   Varicose 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 01/03/2022   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) 09/18/2021   Prediabetes 11/08/2020   Stage 3a chronic kidney disease (HCC) 11/08/2020   Seasonal allergies 11/08/2020   Rhinosinusitis 11/08/2020   Long term current use of systemic steroids 11/08/2020   Anticoagulant disorder (HCC) 11/07/2020   Senile purpura (HCC) 11/07/2020   History of DVT (deep vein thrombosis) 03/22/2020   MDD (major depressive disorder), recurrent episode, mild (HCC) 09/12/2019   Other spondylosis with radiculopathy, lumbar region 02/16/2019   Osteoporosis 10/12/2018   Chronic venous insufficiency 10/07/2018   Lymphedema 10/07/2018   SLE glomerulonephritis syndrome, 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HCC) 03/03/2018   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) 10/29/2016   Mixed hyperlipidemia 01/15/2016   Primary hypertension 01/15/2016   Obesity (BMI 30.0-34.9) 01/15/2016   Long term current use of anticoagulant 10/04/2015   Systemic lupus erythematosus (HCC) 05/30/2015   COPD, moderate (HCC) 06/27/2014   History of pulmonary embolism 12/11/2013   Nodule of right lung 12/11/2013   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 He presents without compression in place. He does not rate pain numerically. Pt states, although he is diligent  with wrapping between visits, his leg feels more swollen today than usual. Pt reports did eat salty snacks yesterday while watching sports on TV.   PERTINENT HISTORY: systemic Lupus, HTN, CVI, varicose veins w inflammation, CKD, stage III, Hx DVT, Hx PE, CAD, COPD, L hip Fx 2/24. 2019 IVC filter  removed, post phlebitic syndrome w ulcer, ankle Fx-R, back sx, hx scrotal abscess, Major depressive disorder. Medications with swelling side effect include amlodipine (NORVASC) , Gabapentin and Prednisone.  PAIN:  Are you having pain? Yes: NPRS scale:  not rated numerically/10 Pain location: L lower extremity- medial thigh and bottom of foot and toes, back Pain description: burning, tingling ,tight, stabbing, heavy Aggravating factors: walking, standing, extended sitting with dependent positioning Relieving factors: elevation  PRECAUTIONS: Other: LYMPHEDEMA PRECAUTIONS: CARDIAC,  PULMONARY,  FALLS:  Has patient fallen in last 6 months? Yes. Number of falls 1. Resulted in L hip fracture  LIVING ENVIRONMENT: Lives with:  lives alone Lives in: House/apartment Stairs: No; External: 0 steps; none Has following equipment at home: shower chair  PRIOR LEVEL OF FUNCTION: Independent  PATIENT GOALS: "I'd like to get back to the condition I was in before all this started happening to me."   OBJECTIVE:  FOTO functional outcome measure. Intake score: 45%  Lymphedema Life Impact Scale (Lymphedema life Impact Scale) Intake: 82.35%  (The extent to which lymphedema -related problems impacted your life during the last week.)  COGNITION/ MOOD:  Within functional limits for tasks assessed   OBSERVATIONS / OTHER ASSESSMENTS: Mild, stage II, BLE lymphedema 2 CVI and suspected, long term lymphatic overload by systemic fluid accumulation   BLE COMPARATIVE LIMB VOLUMETRICS Initial 07/14/23   LANDMARK RIGHT  (dominant)  R LEG (A-D) 3615.6 ml  R THIGH (E-G) 4542.6 ml  R FULL LIMB (A-G) 8163.2 ml  Limb Volume  differential (LVD)  %  Volume change since initial %  Volume change overall V  (Blank rows = not tested)  LANDMARK LEFT  (Rx)  L LEG (A-D) 4526.4 ml  L THIGH (E-G) 6246.3 ml  L FULL LIMB (A-G) 10822.7 ml  Limb Volume differential (LVD)  Leg LVD = 19.76%, L>R. Thigh LVD = 27.77%, L>R Full limb LVD = 24.6%, L>R  Volume change since initial %  Volume change overall %  (Blank rows = not tested)   LLE Volumetrics VISIT 10 :08/25/23  LANDMARK LEFT (Rx)  L LEG (A-D) 4230.5 ml  L THIGH (E-G) 5797.9  ml  L FULL LIMB (A-G) 10028.4  ml  Limb Volume differential (LVD)  %  Volume change since last measured on 07/14/23 The L LEG is decreased in volume by 4.54 % since commencing OT for CDT on 07/14/23. The L THIGH is decreased in volume by 7.9%, and the overall L FULL LIMB reduction measures 7.3%.   Volume change overall V  (Blank rows = not tested)    TODAY'S TREATMENT:                                                                                                                                         Pt edu re compression garment options and recommendations while measuring for garments and devices LLE multilayer  gradient compression wrapping-  toes to groin  PATIENT EDUCATION: Continued Pt/ CG edu for lymphedema self care home program throughout session. Topics include outcome of comparative limb volumetrics- starting limb volume differentials (LVDs), technology and gradient techniques used for short stretch, multilayer compression wrapping, simple self-MLD, therapeutic lymphatic pumping exercises, skin/nail care, LE precautions,. compression garment recommendations and specifications, wear and care schedule and compression garment donning / doffing w assistive devices. Discussed progress towards all OT goals since commencing CDT. All questions answered to the Pt's satisfaction. Good return. Person educated: Patient  Education method: Explanation, Demonstration, and Handouts Education  comprehension: verbalized understanding, returned demonstration, verbal cues required, and needs further education   HOME EXERCISE PROGRAM: At least 2 x daily BLE Lymphatic Pumping There ex Daily BLE/BLQ Skin care to limit infection risk and increase skin excursion Compression Intensive stage compression: multilayer short stretch wraps with gradient techniques. One limb at a time. Thigh length, multi-layer gradient compression wrap to L leg from base of toes to groin using 1 each 8, 10 and 12 cm wide short stretch bandages over single layer of Rosidal foam on the leg in circumferential pattern, and 2 additional 12 cm wide bandages over Rosidal  to groin.  Self-management Phase Compression: Custom, flat knit, BLE compression knee highs. Consider Elvarex classic for daytime and Jobst Relax for HOS  Custom-made gradient compression garments and HOS devices are medically necessary in this case because they are uniquely sized and shaped to fit the exact dimensions of the affected extremities with deformities, and to provide accurate and consistent gradient compression and containment, essential to optimally managing this patient's symptoms of chronic, progressive lymphedema. Multiple custom compression garments are needed for optimal hygiene to limit infection risk. Custom compression garments should be replaced q 3-6 months When worn consistently for optimal lipo-lymphedema self-management over time.  ASSESSMENT: CLINICAL IMPRESSION:  Completed anatomical measurements for custom compression thigh highs, biker shorts and HOS device and faxed to DME provider after session. For optimal lymphedema self management over time Pt will be fitted with : 3, LLE, Custom, Elvarex Classic , flat knit, ccl 2 ( 23-32 mmHg) compression thigh highs 3 Pr ccl 1 (18-21 mmHg) biker shorts 1 LLE thigh length Jobst Relax for HOS Provided Pt edu throughout the session, and then Applied compression wraps as established.  Cont 2  x weekly as per POC.   SHORT TERM GOALS: Target date: 4th OT Rx visit   Pt will demonstrate understanding of lymphedema precautions and prevention strategies with modified independence using a printed reference to identify at least 5 precautions and discussing how s/he may implement them into daily life to reduce risk of progression with extra time. Baseline:Max A Goal status:08/25/23 GOAL MET  2.  Pt will be able to apply multilayer, knee length, gradient, compression wraps to one leg at a time with modified assistance (extra time and assistive device/s) to decrease limb volume, to limit infection risk, and to limit lymphedema progression.  Baseline: Dependent Goal status: 07/17/23  GOAL MET  LONG TERM GOALS: Target date: 10/05/23  Given this patient's Intake score of 45 % on the functional outcomes FOTO tool, patient will experience an increase in function of 3 points to improve basic and instrumental ADLs performance, including lymphedema self-care.  Baseline: Max A Goal status: 08/25/23 PROGRESSING  2.  Given this patient's Intake score of 82.35% on the Lymphedema Life Impact Scale (LLIS), patient will experience a reduction of at least 5 points in her perceived level of functional impairment resulting from lymphedema  to improve functional performance and quality of life (QOL). Baseline: 64.71% Goal status: 08/25/23 PROGRESSING  3.  Pt will achieve at least a 10% volume reduction in LLE to return limb to typical size and shape, to limit infection risk and LE progression, to decrease pain, to improve function. Baseline: Dependent Goal status: 08/25/23 PROGRESSING: The L LEG is decreased in volume by 4.54 % since commencing OT for CDT on 07/14/23. The L THIGH is decreased in volume by 7.9%, and the overall L FULL LIMB reduction measures 7.3%.  4.  Pt will obtain proper compression garments/devices and achieve modified independence (extra time + assistive devices) with donning/doffing to optimize  limb volume reductions and limit LE  progression over time. Baseline:  Goal status:08/25/23 PROGRESSING:  5.  During Intensive phase CDT , with modified independence, Pt will achieve at least 85% compliance with all lymphedema self-care home program components, including daily skin care, compression wraps and /or garments, simple self MLD and lymphatic pumping therex to habituate LE self care protocol  into ADLs for optimal LE self-management over time. Baseline: Dependent Goal status: 08/25/23 PROGRESSING:  OBJECTIVE IMPAIRMENTS: Abnormal gait, cardiopulmonary status limiting activity, decreased activity tolerance, decreased endurance, decreased knowledge of condition, decreased knowledge of use of DME, decreased mobility, and difficulty walking.   ACTIVITY LIMITATIONS: carrying, lifting, bending, sitting, standing, squatting, sleeping, stairs, transfers, bathing, dressing, reach over head, hygiene/grooming, and functional ambulation and mobility.  PARTICIPATION LIMITATIONS: meal prep, cleaning, laundry, interpersonal relationship, driving, shopping, community activity, occupation, and yard work  PERSONAL FACTORS: Behavior pattern, Past/current experiences, Time since onset of injury/illness/exacerbation, and 3+ co morbidities: also affect patient's functional outcome.   REHAB POTENTIAL: Good  EVALUATION COMPLEXITY: Moderate  PLAN: OT FREQUENCY: 2x/week  OT DURATION: 12 weeks and PRN  PLANNED INTERVENTIONS: Therapeutic exercises, Therapeutic activity, Patient/Family education, Self Care, DME instructions, Manual lymph drainage, Compression bandaging, and Manual therapy, customer compression garment and device fitting.  PLAN FOR NEXT SESSION:  Complete custom compression garment and device specifications and measurements. Send to DME vendor for reauthorization and processing LLE Multilayer gradient compression wraps -toes to groin Pt edu re LE self-care- garment options and  recommendations  Loel Dubonnet, MS, OTR/L, CLT-LANA 08/28/23 12:08 PM

## 2023-08-31 ENCOUNTER — Ambulatory Visit: Payer: Medicaid Other | Admitting: Occupational Therapy

## 2023-08-31 ENCOUNTER — Encounter: Payer: Self-pay | Admitting: Occupational Therapy

## 2023-08-31 DIAGNOSIS — I890 Lymphedema, not elsewhere classified: Secondary | ICD-10-CM | POA: Diagnosis not present

## 2023-08-31 NOTE — Therapy (Signed)
OUTPATIENT OCCUPATIONAL THERAPY TREATMENT NOTE  LOWER EXTREMITY LYMPHEDEMA  Patient Name: Jonathon Snow MRN: [id number] DOB:[birth year], 62 y.o., male Today's Date: 08/31/2023  REPORTING PERIOD: 07/07/23 - 08/25/23  END OF SESSION:   OT End of Session - 08/31/23 1008     Visit Number 12    Number of Visits 36    Date for OT Re-Evaluation 10/05/23    OT Start Time 1004    OT Stop Time 1104    OT Time Calculation (min) 60 min    Activity Tolerance Patient tolerated treatment well;No increased pain   Pt moving constantly in seat and pursed lips bgreathing throughout eval session 2/2 reported back pain. 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   Overview:  superior sagittal sinus, left transverse sinus and cortical veins    COVID-19 virus infection 12/2020   Depression    DVT (deep venous thrombosis) (HCC)    Dyspnea    GERD (gastroesophageal reflux disease)    Heel spur, left 02/16/2019   Heel spur, right 02/16/2019   Herpes zoster infection of lumbar region 02/20/2020   History of kidney stones    Hyperlipidemia    Hypertension    Lupus (HCC)    Lymphedema 10/07/2018   Morbid obesity (HCC)    Opiate abuse, episodic (HCC) 02/26/2018   Osteoporosis    Pneumonia    PONV (postoperative nausea and vomiting)    Postphlebitic syndrome with ulcer, 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   Presence of IVC filter 03/22/2020   Removed   Pulmonary embolism (HCC)    Renal disorder    Stage III   Past Surgical History:  Procedure Laterality Date   ANKLE SURGERY Right    BACK SURGERY     BRONCHIAL WASHINGS N/A 11/01/2021   Procedure: BRONCHIAL WASHINGS;  Surgeon: Vida Rigger, MD;  Location: [facility] ORS;  Service: Thoracic;  Laterality: N/A;   COLONOSCOPY WITH PROPOFOL N/A 05/28/2020   Procedure: COLONOSCOPY WITH PROPOFOL;   Surgeon: Wyline Mood, MD;  Location: [facility] ENDOSCOPY;  Service: Endoscopy;  Laterality: N/A;   COLONOSCOPY WITH PROPOFOL N/A 07/01/2023   Procedure: COLONOSCOPY WITH PROPOFOL;  Surgeon: Wyline Mood, MD;  Location: [facility] ENDOSCOPY;  Service: Gastroenterology;  Laterality: N/A;   CYST EXCISION  92 or 93    Liver cyst removal [facility]   FLEXIBLE BRONCHOSCOPY N/A 11/01/2021   Procedure: FLEXIBLE BRONCHOSCOPY;  Surgeon: Vida Rigger, MD;  Location: [facility] ORS;  Service: Thoracic;  Laterality: N/A;   HIP PINNING,CANNULATED Left 02/19/2023   Procedure: PERCUTANEOUS FIXATION OF FEMORAL NECK;  Surgeon: Signa Kell, MD;  Location: [facility] ORS;  Service: Orthopedics;  Laterality: Left;   I & D EXTREMITY Right 04/29/2017   Procedure: IRRIGATION AND DEBRIDEMENT EXTREMITY;  Surgeon: Ricarda Frame, MD;  Location: [facility] ORS;  Service: General;  Laterality: Right;   IRRIGATION AND DEBRIDEMENT ABSCESS Left 04/29/2017   Procedure: IRRIGATION AND DEBRIDEMENT Scrotal ABSCESS;  Surgeon: Ricarda Frame, MD;  Location: [facility] OR[facility];  Service: General;  Laterality: Left;   POLYPECTOMY  07/01/2023   Procedure: POLYPECTOMY;  Surgeon: Wyline Mood, MD;  Location: [facility] ENDOSCOPY;  Service: Gastroenterology;;   Patient Active Problem List   Diagnosis Date Noted   Chronic bilateral low back pain with left-sided sciatica 07/07/2023   Severe episode of recurrent major depressive disorder, without  psychotic features (HCC) 07/07/2023   Neuropathy 07/07/2023   Hx of colonic polyps 07/01/2023   Adenomatous polyp of colon 07/01/2023   SLE (systemic lupus erythematosus related syndrome) (HCC) 02/19/2023   Closed left hip fracture, initial encounter (HCC) 02/18/2023   Pain and swelling of right lower extremity 02/03/2023   Erectile disorder 01/22/2023   Coronary artery disease of native artery of native heart with stable angina pectoris (HCC) 09/09/2022   Current moderate episode of major depressive disorder (HCC) 09/09/2022   Varicose  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 01/03/2022   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) 09/18/2021   Prediabetes 11/08/2020   Stage 3a chronic kidney disease (HCC) 11/08/2020   Seasonal allergies 11/08/2020   Rhinosinusitis 11/08/2020   Long term current use of systemic steroids 11/08/2020   Anticoagulant disorder (HCC) 11/07/2020   Senile purpura (HCC) 11/07/2020   History of DVT (deep vein thrombosis) 03/22/2020   MDD (major depressive disorder), recurrent episode, mild (HCC) 09/12/2019   Other spondylosis with radiculopathy, lumbar region 02/16/2019   Osteoporosis 10/12/2018   Chronic venous insufficiency 10/07/2018   Lymphedema 10/07/2018   SLE glomerulonephritis syndrome, 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HCC) 03/03/2018   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) 10/29/2016   Mixed hyperlipidemia 01/15/2016   Primary hypertension 01/15/2016   Obesity (BMI 30.0-34.9) 01/15/2016   Long term current use of anticoagulant 10/04/2015   Systemic lupus erythematosus (HCC) 05/30/2015   COPD, moderate (HCC) 06/27/2014   History of pulmonary embolism 12/11/2013   Nodule of right lung 12/11/2013   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 He presents without compression in place. He does not rate lymphedema-related pain numerically. Pt has no new  questions or concerns.    PERTINENT HISTORY: systemic Lupus, HTN, CVI, varicose veins w inflammation, CKD, stage III, Hx DVT, Hx PE, CAD, COPD, L hip Fx 2/24. 2019 IVC filter removed, post phlebitic syndrome w ulcer, ankle Fx-R, back sx, hx scrotal abscess, Major depressive disorder. Medications with swelling side effect include amlodipine (NORVASC) ,  Gabapentin and Prednisone.  PAIN:  Are you having pain? Yes: NPRS scale:  not rated numerically/10 Pain location: L lower extremity- medial thigh and bottom of foot and toes, back Pain description: burning, tingling ,tight, stabbing, heavy Aggravating factors: walking, standing, extended sitting with dependent positioning Relieving factors: elevation  PRECAUTIONS: Other: LYMPHEDEMA PRECAUTIONS: CARDIAC,  PULMONARY,  FALLS:  Has patient fallen in last 6 months? Yes. Number of falls 1. Resulted in L hip fracture  LIVING ENVIRONMENT: Lives with:  lives alone Lives in: House/apartment Stairs: No; External: 0 steps; none Has following equipment at home: shower chair  PRIOR LEVEL OF FUNCTION: Independent  PATIENT GOALS: "I'd like to get back to the condition I was in before all this started happening to me."   OBJECTIVE:  FOTO functional outcome measure. Intake score: 45%  Lymphedema Life Impact Scale (Lymphedema life Impact Scale) Intake: 82.35%  (The extent to which lymphedema -related problems impacted your life during the last week.)  COGNITION/ MOOD:  Within functional limits for tasks assessed   OBSERVATIONS / OTHER ASSESSMENTS: Mild, stage II, BLE lymphedema 2 CVI and suspected, long term lymphatic overload by systemic fluid accumulation   BLE COMPARATIVE LIMB VOLUMETRICS Initial 07/14/23   LANDMARK RIGHT  (dominant)  R LEG (A-D) 3615.6 ml  R THIGH (E-G) 4542.6 ml  R FULL LIMB (A-G) 8163.2 ml  Limb Volume differential (LVD)  %  Volume change since initial %  Volume change overall V  (Blank rows = not tested)  LANDMARK LEFT  (Rx)  L LEG (A-D)  4526.4 ml  L THIGH (E-G) 6246.3 ml  L FULL LIMB (A-G) 10822.7 ml  Limb Volume differential (LVD)  Leg LVD = 19.76%, L>R. Thigh LVD = 27.77%, L>R Full limb LVD = 24.6%, L>R  Volume change since initial %  Volume change overall %  (Blank rows = not tested)   LLE Volumetrics VISIT 10 :08/25/23  LANDMARK LEFT (Rx)  L LEG (A-D) 4230.5 ml  L THIGH (E-G) 5797.9  ml  L FULL LIMB (A-G) 10028.4  ml  Limb Volume differential (LVD)  %  Volume change since last measured on 07/14/23 The L LEG is decreased in volume by 4.54 % since commencing OT for CDT on 07/14/23. The L THIGH is decreased in volume by 7.9%, and the overall L FULL LIMB reduction measures 7.3%.   Volume change overall V  (Blank rows = not tested)    TODAY'S TREATMENT:                                                                                                                                         MLD and fibrosis techniques to LLE as established LLE multilayer  gradient compression wrapping- toes to groin  PATIENT EDUCATION: Continued Pt/ CG edu for lymphedema self care home program throughout session. Topics include outcome of comparative limb volumetrics- starting limb volume differentials (LVDs), technology  and gradient techniques used for short stretch, multilayer compression wrapping, simple self-MLD, therapeutic lymphatic pumping exercises, skin/nail care, LE precautions,. compression garment recommendations and specifications, wear and care schedule and compression garment donning / doffing w assistive devices. Discussed progress towards all OT goals since commencing CDT. All questions answered to the Pt's satisfaction. Good return. Person educated: Patient  Education method: Explanation, Demonstration, and Handouts Education comprehension: verbalized understanding, returned demonstration, verbal cues required, and needs further education   HOME EXERCISE PROGRAM: At least 2 x daily BLE Lymphatic Pumping There ex Daily  BLE/BLQ Skin care to limit infection risk and increase skin excursion Compression Intensive stage compression: multilayer short stretch wraps with gradient techniques. One limb at a time. Thigh length, multi-layer gradient compression wrap to L leg from base of toes to groin using 1 each 8, 10 and 12 cm wide short stretch bandages over single layer of Rosidal foam on the leg in circumferential pattern, and 2 additional 12 cm wide bandages over Rosidal  to groin.  Self-management Phase Compression: Custom, flat knit, BLE compression knee highs. Consider Elvarex classic for daytime and Jobst Relax for HOS  3, LLE, Custom, Elvarex Classic , flat knit, ccl 2 ( 23-32 mmHg) compression thigh highs 3 Pr ccl 1 (18-21 mmHg) biker shorts 1 LLE thigh length Jobst Relax for HOS  Custom-made gradient compression garments and HOS devices are medically necessary in this case because they are uniquely sized and shaped to fit the exact dimensions of the affected extremities with deformities, and to provide accurate and consistent gradient compression and containment, essential to optimally managing this patient's symptoms of chronic, progressive lymphedema. Multiple custom compression garments are needed for optimal hygiene to limit infection risk. Custom compression garments should be replaced q 3-6 months When worn consistently for optimal lipo-lymphedema self-management over time.  ASSESSMENT: CLINICAL IMPRESSION:  Continued LLE/LLQ MLD with simultaneous skin care w emphasis on fibrosis techniques at distal leg and ankle to improve skin excursion and flexibility. Good tolerance.  Provided Pt edu throughout the session re LE self care, , and then Applied compression wraps as established. Cont as per POC.  Cont 2 x weekly as per POC.   SHORT TERM GOALS: Target date: 4th OT Rx visit   Pt will demonstrate understanding of lymphedema precautions and prevention strategies with modified independence using a printed  reference to identify at least 5 precautions and discussing how s/he may implement them into daily life to reduce risk of progression with extra time. Baseline:Max A Goal status:08/25/23 GOAL MET  2.  Pt will be able to apply multilayer, knee length, gradient, compression wraps to one leg at a time with modified assistance (extra time and assistive device/s) to decrease limb volume, to limit infection risk, and to limit lymphedema progression.  Baseline: Dependent Goal status: 07/17/23  GOAL MET  LONG TERM GOALS: Target date: 10/05/23  Given this patient's Intake score of 45 % on the functional outcomes FOTO tool, patient will experience an increase in function of 3 points to improve basic and instrumental ADLs performance, including lymphedema self-care.  Baseline: Max A Goal status: 08/25/23 PROGRESSING  2.  Given this patient's Intake score of 82.35% on the Lymphedema Life Impact Scale (LLIS), patient will experience a reduction of at least 5 points in her perceived level of functional impairment resulting from lymphedema to improve functional performance and quality of life (QOL). Baseline: 64.71% Goal status: 08/25/23 PROGRESSING  3.  Pt will achieve at least a 10% volume reduction in  LLE to return limb to typical size and shape, to limit infection risk and LE progression, to decrease pain, to improve function. Baseline: Dependent Goal status: 08/25/23 PROGRESSING: The L LEG is decreased in volume by 4.54 % since commencing OT for CDT on 07/14/23. The L THIGH is decreased in volume by 7.9%, and the overall L FULL LIMB reduction measures 7.3%.  4.  Pt will obtain proper compression garments/devices and achieve modified independence (extra time + assistive devices) with donning/doffing to optimize limb volume reductions and limit LE  progression over time. Baseline:  Goal status:08/25/23 PROGRESSING:  5.  During Intensive phase CDT , with modified independence, Pt will achieve at least 85%  compliance with all lymphedema self-care home program components, including daily skin care, compression wraps and /or garments, simple self MLD and lymphatic pumping therex to habituate LE self care protocol  into ADLs for optimal LE self-management over time. Baseline: Dependent Goal status: 08/25/23 PROGRESSING:  OBJECTIVE IMPAIRMENTS: Abnormal gait, cardiopulmonary status limiting activity, decreased activity tolerance, decreased endurance, decreased knowledge of condition, decreased knowledge of use of DME, decreased mobility, and difficulty walking.   ACTIVITY LIMITATIONS: carrying, lifting, bending, sitting, standing, squatting, sleeping, stairs, transfers, bathing, dressing, reach over head, hygiene/grooming, and functional ambulation and mobility.  PARTICIPATION LIMITATIONS: meal prep, cleaning, laundry, interpersonal relationship, driving, shopping, community activity, occupation, and yard work  PERSONAL FACTORS: Behavior pattern, Past/current experiences, Time since onset of injury/illness/exacerbation, and 3+ co morbidities: also affect patient's functional outcome.   REHAB POTENTIAL: Good  EVALUATION COMPLEXITY: Moderate  PLAN: OT FREQUENCY: 2x/week  OT DURATION: 12 weeks and PRN  PLANNED INTERVENTIONS: Therapeutic exercises, Therapeutic activity, Patient/Family education, Self Care, DME instructions, Manual lymph drainage, Compression bandaging, and Manual therapy, customer compression garment and device fitting.  PLAN FOR NEXT SESSION:  Complete custom compression garment and device specifications and measurements. Send to DME vendor for reauthorization and processing LLE Multilayer gradient compression wraps -toes to groin Pt edu re LE self-care- garment options and recommendations  Loel Dubonnet, MS, OTR/L, CLT-LANA 08/31/23 12:21 PM

## 2023-09-01 ENCOUNTER — Ambulatory Visit: Payer: Medicaid Other | Admitting: Occupational Therapy

## 2023-09-02 DIAGNOSIS — M5416 Radiculopathy, lumbar region: Secondary | ICD-10-CM | POA: Diagnosis not present

## 2023-09-02 DIAGNOSIS — M545 Low back pain, unspecified: Secondary | ICD-10-CM | POA: Diagnosis not present

## 2023-09-03 DIAGNOSIS — F332 Major depressive disorder, recurrent severe without psychotic features: Secondary | ICD-10-CM | POA: Diagnosis not present

## 2023-09-07 DIAGNOSIS — M5416 Radiculopathy, lumbar region: Secondary | ICD-10-CM | POA: Diagnosis not present

## 2023-09-07 DIAGNOSIS — M545 Low back pain, unspecified: Secondary | ICD-10-CM | POA: Diagnosis not present

## 2023-09-08 ENCOUNTER — Ambulatory Visit: Payer: Medicaid Other | Admitting: Occupational Therapy

## 2023-09-08 ENCOUNTER — Encounter: Payer: Self-pay | Admitting: Occupational Therapy

## 2023-09-08 DIAGNOSIS — I890 Lymphedema, not elsewhere classified: Secondary | ICD-10-CM | POA: Diagnosis not present

## 2023-09-08 DIAGNOSIS — M8000XD Age-related osteoporosis with current pathological fracture, unspecified site, subsequent encounter for fracture with routine healing: Secondary | ICD-10-CM | POA: Diagnosis not present

## 2023-09-08 NOTE — Therapy (Signed)
OUTPATIENT OCCUPATIONAL THERAPY TREATMENT NOTE  LOWER EXTREMITY LYMPHEDEMA  Patient Name: Jonathon Snow MRN: [id number] DOB:1961-09-24, 62 y.o., male Today's Date: 09/08/2023  REPORTING PERIOD: 07/07/23 - 08/25/23  END OF SESSION:   OT End of Session - 09/08/23 1108     Visit Number 13    Number of Visits 36    Date for OT Re-Evaluation 10/05/23    OT Start Time 1105    OT Stop Time 1205    OT Time Calculation (min) 60 min    Activity Tolerance Patient tolerated treatment well;No increased pain   Pt moving constantly in seat and pursed lips bgreathing throughout eval session 2/2 reported back pain. Not rated numerically   Behavior During Therapy [facility] for tasks assessed/performed              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   Overview:  superior sagittal sinus, left transverse sinus and cortical veins    COVID-19 virus infection 12/2020   Depression    DVT (deep venous thrombosis) (HCC)    Dyspnea    GERD (gastroesophageal reflux disease)    Heel spur, left 02/16/2019   Heel spur, right 02/16/2019   Herpes zoster infection of lumbar region 02/20/2020   History of kidney stones    Hyperlipidemia    Hypertension    Lupus (HCC)    Lymphedema 10/07/2018   Morbid obesity (HCC)    Opiate abuse, episodic (HCC) 02/26/2018   Osteoporosis    Pneumonia    PONV (postoperative nausea and vomiting)    Postphlebitic syndrome with ulcer, 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   Presence of IVC filter 03/22/2020   Removed   Pulmonary embolism (HCC)    Renal disorder    Stage III   Past Surgical History:  Procedure Laterality Date   ANKLE SURGERY Right    BACK SURGERY     BRONCHIAL WASHINGS N/A 11/01/2021   Procedure: BRONCHIAL WASHINGS;  Surgeon: Vida Rigger, MD;  Location: [facility] ORS;  Service: Thoracic;  Laterality: N/A;   COLONOSCOPY WITH PROPOFOL N/A 05/28/2020   Procedure: COLONOSCOPY WITH  PROPOFOL;  Surgeon: Wyline Mood, MD;  Location: [facility] ENDOSCOPY;  Service: Endoscopy;  Laterality: N/A;   COLONOSCOPY WITH PROPOFOL N/A 07/01/2023   Procedure: COLONOSCOPY WITH PROPOFOL;  Surgeon: Wyline Mood, MD;  Location: [facility] ENDOSCOPY;  Service: Gastroenterology;  Laterality: N/A;   CYST EXCISION  92 or 93    Liver cyst removal [facility]   FLEXIBLE BRONCHOSCOPY N/A 11/01/2021   Procedure: FLEXIBLE BRONCHOSCOPY;  Surgeon: Vida Rigger, MD;  Location: [facility] ORS;  Service: Thoracic;  Laterality: N/A;   HIP PINNING,CANNULATED Left 02/19/2023   Procedure: PERCUTANEOUS FIXATION OF FEMORAL NECK;  Surgeon: Signa Kell, MD;  Location: [facility] ORS;  Service: Orthopedics;  Laterality: Left;   I & D EXTREMITY Right 04/29/2017   Procedure: IRRIGATION AND DEBRIDEMENT EXTREMITY;  Surgeon: Ricarda Frame, MD;  Location: [facility] ORS;  Service: General;  Laterality: Right;   IRRIGATION AND DEBRIDEMENT ABSCESS Left 04/29/2017   Procedure: IRRIGATION AND DEBRIDEMENT Scrotal ABSCESS;  Surgeon: Ricarda Frame, MD;  Location: [facility] ORS;  Service: General;  Laterality: Left;   POLYPECTOMY  07/01/2023   Procedure: POLYPECTOMY;  Surgeon: Wyline Mood, MD;  Location: [facility] ENDOSCOPY;  Service: Gastroenterology;;   Patient Active Problem List   Diagnosis Date Noted   Chronic bilateral low back pain with left-sided sciatica 07/07/2023   Severe episode of recurrent major depressive disorder, without  psychotic features (HCC) 07/07/2023   Neuropathy 07/07/2023   Hx of colonic polyps 07/01/2023   Adenomatous polyp of colon 07/01/2023   SLE (systemic lupus erythematosus related syndrome) (HCC) 02/19/2023   Closed left hip fracture, initial encounter (HCC) 02/18/2023   Pain and swelling of right lower extremity 02/03/2023   Erectile disorder 01/22/2023   Coronary artery disease of native artery of native heart with stable angina pectoris (HCC) 09/09/2022   Current moderate episode of major depressive disorder (HCC) 09/09/2022    Varicose 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 01/03/2022   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) 09/18/2021   Prediabetes 11/08/2020   Stage 3a chronic kidney disease (HCC) 11/08/2020   Seasonal allergies 11/08/2020   Rhinosinusitis 11/08/2020   Long term current use of systemic steroids 11/08/2020   Anticoagulant disorder (HCC) 11/07/2020   Senile purpura (HCC) 11/07/2020   History of DVT (deep vein thrombosis) 03/22/2020   MDD (major depressive disorder), recurrent episode, mild (HCC) 09/12/2019   Other spondylosis with radiculopathy, lumbar region 02/16/2019   Osteoporosis 10/12/2018   Chronic venous insufficiency 10/07/2018   Lymphedema 10/07/2018   SLE glomerulonephritis syndrome, 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HCC) 03/03/2018   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) 10/29/2016   Mixed hyperlipidemia 01/15/2016   Primary hypertension 01/15/2016   Obesity (BMI 30.0-34.9) 01/15/2016   Long term current use of anticoagulant 10/04/2015   Systemic lupus erythematosus (HCC) 05/30/2015   COPD, moderate (HCC) 06/27/2014   History of pulmonary embolism 12/11/2013   Nodule of right lung 12/11/2013   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 He presents without compression in place. He does not rate lymphedema-related pain numerically. Pt has no new  questions or  concerns. He states he hopes his compression garments arrive soon. OT emailed DME vendor for status update, and she emailed back that they are in [place] authorization process.  PERTINENT HISTORY: systemic Lupus, HTN, CVI, varicose veins w inflammation, CKD, stage III, Hx DVT, Hx PE, CAD, COPD, L hip Fx 2/24. 2019  IVC filter removed, post phlebitic syndrome w ulcer, ankle Fx-R, back sx, hx scrotal abscess, Major depressive disorder. Medications with swelling side effect include amlodipine (NORVASC) , Gabapentin and Prednisone.  PAIN:  Are you having pain? Yes: NPRS scale:  not rated numerically/10 Pain location: L lower extremity- medial thigh and bottom of foot and toes, back Pain description: burning, tingling ,tight, stabbing, heavy Aggravating factors: walking, standing, extended sitting with dependent positioning Relieving factors: elevation  PRECAUTIONS: Other: LYMPHEDEMA PRECAUTIONS: CARDIAC,  PULMONARY,  FALLS:  Has patient fallen in last 6 months? Yes. Number of falls 1. Resulted in L hip fracture  LIVING ENVIRONMENT: Lives with:  lives alone Lives in: House/apartment Stairs: No; External: 0 steps; none Has following equipment at home: shower chair  PRIOR LEVEL OF FUNCTION: Independent  PATIENT GOALS: "I'd like to get back to the condition I was in before all this started happening to me."   OBJECTIVE:  FOTO functional outcome measure. Intake score: 45%  Lymphedema Life Impact Scale (Lymphedema life Impact Scale) Intake: 82.35%  (The extent to which lymphedema -related problems impacted your life during the last week.)  COGNITION/ MOOD:  Within functional limits for tasks assessed   OBSERVATIONS / OTHER ASSESSMENTS: Mild, stage II, BLE lymphedema 2 CVI and suspected, long term lymphatic overload by systemic fluid accumulation   BLE COMPARATIVE LIMB VOLUMETRICS Initial 07/14/23   LANDMARK RIGHT  (dominant)  R LEG (A-D) 3615.6 ml  R THIGH (E-G) 4542.6 ml  R FULL  LIMB (A-G) 8163.2 ml  Limb Volume differential (LVD)  %  Volume change since initial %  Volume change overall V  (Blank rows = not tested)  LANDMARK LEFT  (Rx)  L LEG (A-D) 4526.4 ml  L THIGH (E-G) 6246.3 ml  L FULL LIMB (A-G) 10822.7 ml  Limb Volume differential (LVD)  Leg LVD = 19.76%, L>R. Thigh LVD = 27.77%, L>R Full limb LVD = 24.6%, L>R  Volume change since initial %  Volume change overall %  (Blank rows = not tested)   LLE Volumetrics VISIT 10 :08/25/23  LANDMARK LEFT (Rx)  L LEG (A-D) 4230.5 ml  L THIGH (E-G) 5797.9  ml  L FULL LIMB (A-G) 10028.4  ml  Limb Volume differential (LVD)  %  Volume change since last measured on 07/14/23 The L LEG is decreased in volume by 4.54 % since commencing OT for CDT on 07/14/23. The L THIGH is decreased in volume by 7.9%, and the overall L FULL LIMB reduction measures 7.3%.   Volume change overall V  (Blank rows = not tested)    TODAY'S TREATMENT:                                                                                                                                         MLD and fibrosis techniques to LLE as established LLE multilayer  gradient compression wrapping- toes to groin  PATIENT EDUCATION: Continued Pt/ CG edu for lymphedema self care home program throughout session. Topics include outcome of comparative limb volumetrics- starting limb volume differentials (LVDs), technology and gradient techniques used for short stretch, multilayer compression wrapping, simple self-MLD, therapeutic lymphatic pumping exercises, skin/nail care, LE precautions,. compression garment recommendations and specifications, wear and care schedule and compression garment donning / doffing w assistive devices. Discussed progress towards all OT goals since commencing CDT. All questions answered to the Pt's satisfaction. Good return. Person educated: Patient  Education method: Explanation, Demonstration, and Handouts Education comprehension:  verbalized understanding, returned demonstration, verbal cues required, and needs further education   HOME EXERCISE PROGRAM: At least 2 x daily BLE Lymphatic Pumping There ex Daily BLE/BLQ Skin care to limit infection risk and increase skin excursion Compression Intensive stage compression: multilayer short stretch wraps with gradient techniques. One limb at a time. Thigh length, multi-layer gradient compression wrap to L leg from base of toes to groin using 1 each 8, 10 and 12 cm wide short stretch bandages over single layer of Rosidal foam on the leg in circumferential pattern, and 2 additional 12 cm wide bandages over Rosidal  to groin.  Self-management Phase Compression: Custom, flat knit, BLE compression knee highs. Consider Elvarex classic for daytime and Jobst Relax for HOS  3, LLE, Custom, Elvarex Classic , flat knit, ccl 2 ( 23-32 mmHg) compression thigh highs 3 Pr ccl 1 (18-21 mmHg) biker shorts 1 LLE thigh length Jobst Relax for HOS  Custom-made gradient compression garments and HOS devices are medically necessary in this case because they are uniquely sized and shaped to fit the exact dimensions of the affected extremities with deformities, and to provide accurate and consistent gradient compression and containment, essential to optimally managing this patient's symptoms of chronic, progressive lymphedema. Multiple custom compression garments are needed for optimal hygiene to limit infection risk. Custom compression garments should be replaced q 3-6 months When worn consistently for optimal lipo-lymphedema self-management over time.  ASSESSMENT: CLINICAL IMPRESSION:  Continued LLE/LLQ MLD with simultaneous skin care w emphasis on fibrosis techniques at distal leg and ankle to improve skin excursion and flexibility. Good tolerance.  Provided Pt edu throughout the session re LE self care, , and then Applied compression wraps as established. Cont as per POC 2 x weekly .  SHORT TERM GOALS:  Target date: 4th OT Rx visit   Pt will demonstrate understanding of lymphedema precautions and prevention strategies with modified independence using a printed reference to identify at least 5 precautions and discussing how s/he may implement them into daily life to reduce risk of progression with extra time. Baseline:Max A Goal status:08/25/23 GOAL MET  2.  Pt will be able to apply multilayer, knee length, gradient, compression wraps to one leg at a time with modified assistance (extra time and assistive device/s) to decrease limb volume, to limit infection risk, and to limit lymphedema progression.  Baseline: Dependent Goal status: 07/17/23  GOAL MET  LONG TERM GOALS: Target date: 10/05/23  Given this patient's Intake score of 45 % on the functional outcomes FOTO tool, patient will experience an increase in function of 3 points to improve basic and instrumental ADLs performance, including lymphedema self-care.  Baseline: Max A Goal status: 08/25/23 PROGRESSING  2.  Given this patient's Intake score of 82.35% on the Lymphedema Life Impact Scale (LLIS), patient will experience a reduction of at least 5 points in her perceived level of functional impairment resulting from lymphedema to improve functional performance and quality  of life (QOL). Baseline: 64.71% Goal status: 08/25/23 PROGRESSING  3.  Pt will achieve at least a 10% volume reduction in LLE to return limb to typical size and shape, to limit infection risk and LE progression, to decrease pain, to improve function. Baseline: Dependent Goal status: 08/25/23 PROGRESSING: The L LEG is decreased in volume by 4.54 % since commencing OT for CDT on 07/14/23. The L THIGH is decreased in volume by 7.9%, and the overall L FULL LIMB reduction measures 7.3%.  4.  Pt will obtain proper compression garments/devices and achieve modified independence (extra time + assistive devices) with donning/doffing to optimize limb volume reductions and limit LE   progression over time. Baseline:  Goal status:08/25/23 PROGRESSING:  5.  During Intensive phase CDT , with modified independence, Pt will achieve at least 85% compliance with all lymphedema self-care home program components, including daily skin care, compression wraps and /or garments, simple self MLD and lymphatic pumping therex to habituate LE self care protocol  into ADLs for optimal LE self-management over time. Baseline: Dependent Goal status: 08/25/23 PROGRESSING:  OBJECTIVE IMPAIRMENTS: Abnormal gait, cardiopulmonary status limiting activity, decreased activity tolerance, decreased endurance, decreased knowledge of condition, decreased knowledge of use of DME, decreased mobility, and difficulty walking.   ACTIVITY LIMITATIONS: carrying, lifting, bending, sitting, standing, squatting, sleeping, stairs, transfers, bathing, dressing, reach over head, hygiene/grooming, and functional ambulation and mobility.  PARTICIPATION LIMITATIONS: meal prep, cleaning, laundry, interpersonal relationship, driving, shopping, community activity, occupation, and yard work  PERSONAL FACTORS: Behavior pattern, Past/current experiences, Time since onset of injury/illness/exacerbation, and 3+ co morbidities: also affect patient's functional outcome.   REHAB POTENTIAL: Good  EVALUATION COMPLEXITY: Moderate  PLAN: OT FREQUENCY: 2x/week  OT DURATION: 12 weeks and PRN  PLANNED INTERVENTIONS: Therapeutic exercises, Therapeutic activity, Patient/Family education, Self Care, DME instructions, Manual lymph drainage, Compression bandaging, and Manual therapy, customer compression garment and device fitting.  PLAN FOR NEXT SESSION:  Complete custom compression garment and device specifications and measurements. Send to DME vendor for reauthorization and processing LLE Multilayer gradient compression wraps -toes to groin Pt edu re LE self-care- garment options and recommendations  Loel Dubonnet, MS, OTR/L,  CLT-LANA 09/08/23 12:52 PM

## 2023-09-09 DIAGNOSIS — M8000XD Age-related osteoporosis with current pathological fracture, unspecified site, subsequent encounter for fracture with routine healing: Secondary | ICD-10-CM | POA: Diagnosis not present

## 2023-09-10 ENCOUNTER — Ambulatory Visit: Payer: Medicaid Other | Admitting: Dermatology

## 2023-09-10 DIAGNOSIS — F418 Other specified anxiety disorders: Secondary | ICD-10-CM | POA: Diagnosis not present

## 2023-09-10 DIAGNOSIS — M5416 Radiculopathy, lumbar region: Secondary | ICD-10-CM | POA: Diagnosis not present

## 2023-09-11 ENCOUNTER — Ambulatory Visit: Payer: Medicaid Other | Admitting: Occupational Therapy

## 2023-09-14 ENCOUNTER — Encounter: Payer: Self-pay | Admitting: Orthopedic Surgery

## 2023-09-15 ENCOUNTER — Ambulatory Visit (INDEPENDENT_AMBULATORY_CARE_PROVIDER_SITE_OTHER): Payer: Medicaid Other

## 2023-09-15 ENCOUNTER — Ambulatory Visit: Payer: Medicaid Other | Admitting: Occupational Therapy

## 2023-09-15 DIAGNOSIS — I890 Lymphedema, not elsewhere classified: Secondary | ICD-10-CM

## 2023-09-15 DIAGNOSIS — Z23 Encounter for immunization: Secondary | ICD-10-CM

## 2023-09-15 NOTE — Therapy (Signed)
OUTPATIENT OCCUPATIONAL THERAPY TREATMENT NOTE  LOWER EXTREMITY LYMPHEDEMA  Patient Name: Jonathon Snow MRN: [id number] DOB:19-Feb-1961, 62 y.o., male Today's Date: 09/15/2023  END OF SESSION:   OT End of Session - 09/15/23 0854     Visit Number 14    Number of Visits 36    Date for OT Re-Evaluation 10/05/23    OT Start Time 0855    OT Stop Time 0957    OT Time Calculation (min) 62 min    Activity Tolerance Patient tolerated treatment well;No increased pain      Behavior During Therapy [facility] for tasks assessed/performed              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   Overview:  superior sagittal sinus, left transverse sinus and cortical veins    COVID-19 virus infection 12/2020   Depression    DVT (deep venous thrombosis) (HCC)    Dyspnea    GERD (gastroesophageal reflux disease)    Heel spur, left 02/16/2019   Heel spur, right 02/16/2019   Herpes zoster infection of lumbar region 02/20/2020   History of kidney stones    Hyperlipidemia    Hypertension    Lupus (HCC)    Lymphedema 10/07/2018   Morbid obesity (HCC)    Opiate abuse, episodic (HCC) 02/26/2018   Osteoporosis    Pneumonia    PONV (postoperative nausea and vomiting)    Postphlebitic syndrome with ulcer, 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   Presence of IVC filter 03/22/2020   Removed   Pulmonary embolism (HCC)    Renal disorder    Stage III   Past Surgical History:  Procedure Laterality Date   ANKLE SURGERY Right    BACK SURGERY     BRONCHIAL WASHINGS N/A 11/01/2021   Procedure: BRONCHIAL WASHINGS;  Surgeon: Vida Rigger, MD;  Location: [facility] ORS;  Service: Thoracic;  Laterality: N/A;   COLONOSCOPY WITH PROPOFOL N/A 05/28/2020   Procedure: COLONOSCOPY WITH PROPOFOL;  Surgeon: Wyline Mood, MD;  Location: [facility] ENDOSCOPY;  Service: Endoscopy;  Laterality: N/A;   COLONOSCOPY WITH PROPOFOL N/A 07/01/2023   Procedure: COLONOSCOPY  WITH PROPOFOL;  Surgeon: Wyline Mood, MD;  Location: [facility] ENDOSCOPY;  Service: Gastroenterology;  Laterality: N/A;   CYST EXCISION  92 or 93    Liver cyst removal [facility]   FLEXIBLE BRONCHOSCOPY N/A 11/01/2021   Procedure: FLEXIBLE BRONCHOSCOPY;  Surgeon: Vida Rigger, MD;  Location: [facility] ORS;  Service: Thoracic;  Laterality: N/A;   HIP PINNING,CANNULATED Left 02/19/2023   Procedure: PERCUTANEOUS FIXATION OF FEMORAL NECK;  Surgeon: Signa Kell, MD;  Location: [facility] ORS;  Service: Orthopedics;  Laterality: Left;   I & D EXTREMITY Right 04/29/2017   Procedure: IRRIGATION AND DEBRIDEMENT EXTREMITY;  Surgeon: Ricarda Frame, MD;  Location: [facility] ORS;  Service: General;  Laterality: Right;   IRRIGATION AND DEBRIDEMENT ABSCESS Left 04/29/2017   Procedure: IRRIGATION AND DEBRIDEMENT Scrotal ABSCESS;  Surgeon: Ricarda Frame, MD;  Location: [facility] ORS;  Service: General;  Laterality: Left;   POLYPECTOMY  07/01/2023   Procedure: POLYPECTOMY;  Surgeon: Wyline Mood, MD;  Location: [facility] ENDOSCOPY;  Service: Gastroenterology;;   Patient Active Problem List   Diagnosis Date Noted   Chronic bilateral low back pain with left-sided sciatica 07/07/2023   Severe episode of recurrent major depressive disorder, 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   Neuropathy 07/07/2023   Hx of colonic polyps 07/01/2023   Adenomatous polyp of colon 07/01/2023  SLE (systemic lupus erythematosus related syndrome) (HCC) 02/19/2023   Closed left hip fracture, initial encounter (HCC) 02/18/2023   Pain and swelling of right lower extremity 02/03/2023   Erectile disorder 01/22/2023   Coronary artery disease of native artery of native heart with stable angina pectoris (HCC) 09/09/2022   Current moderate episode of major depressive disorder (HCC) 09/09/2022   Varicose 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 01/03/2022   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) 09/18/2021   Prediabetes 11/08/2020   Stage 3a  chronic kidney disease (HCC) 11/08/2020   Seasonal allergies 11/08/2020   Rhinosinusitis 11/08/2020   Long term current use of systemic steroids 11/08/2020   Anticoagulant disorder (HCC) 11/07/2020   Senile purpura (HCC) 11/07/2020   History of DVT (deep vein thrombosis) 03/22/2020   MDD (major depressive disorder), recurrent episode, mild (HCC) 09/12/2019   Other spondylosis with radiculopathy, lumbar region 02/16/2019   Osteoporosis 10/12/2018   Chronic venous insufficiency 10/07/2018   Lymphedema 10/07/2018   SLE glomerulonephritis syndrome, 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HCC) 03/03/2018   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) 10/29/2016   Mixed hyperlipidemia 01/15/2016   Primary hypertension 01/15/2016   Obesity (BMI 30.0-34.9) 01/15/2016   Long term current use of anticoagulant 10/04/2015   Systemic lupus erythematosus (HCC) 05/30/2015   COPD, moderate (HCC) 06/27/2014   History of pulmonary embolism 12/11/2013   Nodule of right lung 12/11/2013   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 We were able to get him early this morning after a previous appointment so he would not have to wait. He presents without compression in place. He denies LE related pain, but rates back pain at 6/10.  Pt reports he has a little more swelling today and bruising on the L foot. "I don't know where  that's coming from."  PERTINENT HISTORY: systemic Lupus, HTN, CVI, varicose veins w inflammation, CKD, stage III, Hx DVT, Hx PE, CAD, COPD, L hip Fx 2/24. 2019 IVC filter removed, post phlebitic syndrome w ulcer, ankle Fx-R, back sx,  hx scrotal abscess, Major depressive disorder. Medications with swelling side effect include amlodipine (NORVASC) , Gabapentin and Prednisone.  PAIN:  Are you having pain? No LE-related pain; Back pain 6/10 Pain location: back Pain description: burning, tingling ,tight, stabbing, heavy Aggravating factors: walking, standing, extended sitting with dependent positioning Relieving factors: elevation  PRECAUTIONS: Other: LYMPHEDEMA PRECAUTIONS: CARDIAC,  PULMONARY,  FALLS:  Has patient fallen in last 6 months? Yes. Number of falls 1. Resulted in L hip fracture  LIVING ENVIRONMENT: Lives with:  lives alone Lives in: House/apartment Stairs: No; External: 0 steps; none Has following equipment at home: shower chair  PRIOR LEVEL OF FUNCTION: Independent  PATIENT GOALS: "I'd like to get back to the condition I was in before all this started happening to me."   OBJECTIVE:  FOTO functional outcome measure. Intake score: 45%  Lymphedema Life Impact Scale (Lymphedema life Impact Scale) Intake: 82.35%  (The extent to which lymphedema -related problems impacted your life during the last week.)  COGNITION/ MOOD:  Within functional limits for tasks assessed   OBSERVATIONS / OTHER ASSESSMENTS: Mild, stage II, BLE lymphedema 2 CVI and suspected, long term lymphatic overload by systemic fluid accumulation   BLE COMPARATIVE LIMB VOLUMETRICS Initial 07/14/23   LANDMARK RIGHT  (dominant)  R LEG (A-D) 3615.6 ml  R THIGH (E-G) 4542.6 ml  R FULL LIMB (A-G) 8163.2 ml  Limb Volume differential (LVD)  %  Volume change since initial %  Volume change overall V  (Blank rows = not tested)  LANDMARK LEFT  (Rx)  L LEG (A-D) 4526.4 ml  L THIGH (E-G) 6246.3 ml  L FULL LIMB  (A-G) 10822.7 ml  Limb Volume differential (LVD)  Leg LVD = 19.76%, L>R. Thigh LVD = 27.77%, L>R Full limb LVD = 24.6%, L>R  Volume change since initial %  Volume change overall %  (Blank rows = not tested)   LLE Volumetrics VISIT 10 :08/25/23  LANDMARK LEFT (Rx)  L LEG (A-D) 4230.5 ml  L THIGH (E-G) 5797.9  ml  L FULL LIMB (A-G) 10028.4  ml  Limb Volume differential (LVD)  %  Volume change since last measured on 07/14/23 The L LEG is decreased in volume by 4.54 % since commencing OT for CDT on 07/14/23. The L THIGH is decreased in volume by 7.9%, and the overall L FULL LIMB reduction measures 7.3%.   Volume change overall V  (Blank rows = not tested)   LLE Volumetrics VISIT 20: TBA  LANDMARK LEFT (Rx)  L LEG (A-D) 4230.5 ml  L THIGH (E-G) 5797.9  ml  L FULL LIMB (A-G) 10028.4  ml  Limb Volume differential (LVD)  %  Change since last measured   Volume change overall V  (Blank rows = not tested)   TODAY'S TREATMENT:  MLD and fibrosis techniques to LLE as established LLE multilayer  gradient compression wrapping- toes to groin  PATIENT EDUCATION: Continued Pt/ CG edu for lymphedema self care home program throughout session. Topics include outcome of comparative limb volumetrics- starting limb volume differentials (LVDs), technology and gradient techniques used for short stretch, multilayer compression wrapping, simple self-MLD, therapeutic lymphatic pumping exercises, skin/nail care, LE precautions,. compression garment recommendations and specifications, wear and care schedule and compression garment donning / doffing w assistive devices. Discussed progress towards all OT goals since commencing CDT. All questions answered to the Pt's satisfaction. Good return. Person educated: Patient  Education method: Explanation, Demonstration, and  Handouts Education comprehension: verbalized understanding, returned demonstration, verbal cues required, and needs further education   HOME EXERCISE PROGRAM: At least 2 x daily BLE Lymphatic Pumping There ex Daily BLE/BLQ Skin care to limit infection risk and increase skin excursion Compression Intensive stage compression: multilayer short stretch wraps with gradient techniques. One limb at a time. Thigh length, multi-layer gradient compression wrap to L leg from base of toes to groin using 1 each 8, 10 and 12 cm wide short stretch bandages over single layer of Rosidal foam on the leg in circumferential pattern, and 2 additional 12 cm wide bandages over Rosidal  to groin.  Self-management Phase Compression: Custom, flat knit, BLE compression knee highs. Consider Elvarex classic for daytime and Jobst Relax for HOS  3, LLE, Custom, Elvarex Classic , flat knit, ccl 2 ( 23-32 mmHg) compression thigh highs 3 Pr ccl 1 (18-21 mmHg) biker shorts 1 LLE thigh length Jobst Relax for HOS  Custom-made gradient compression garments and HOS devices are medically necessary in this case because they are uniquely sized and shaped to fit the exact dimensions of the affected extremities with deformities, and to provide accurate and consistent gradient compression and containment, essential to optimally managing this patient's symptoms of chronic, progressive lymphedema. Multiple custom compression garments are needed for optimal hygiene to limit infection risk. Custom compression garments should be replaced q 3-6 months When worn consistently for optimal lipo-lymphedema self-management over time.  ASSESSMENT: CLINICAL IMPRESSION:  Pt has quarter-sized, round, reddened bruise on dorsal surface of foot at base of 3rd and 4th toes. He also has diffuse, brownish bruising on lateral surface of foot adjacent to the met head.  Swelling appears well controlled and not increased compared w last visit. Pt is unsure of  how  bruising occurred. It does not appear to be a hemophage. Pt denies associated pain.  Continued LLE/LLQ MLD with simultaneous skin care w emphasis on fibrosis techniques at distal leg and ankle to improve skin excursion and flexibility. Good tolerance.  Provided Pt edu throughout the session re LE self care, , and then Applied compression wraps as established. Cont as per POC 2 x weekly .  SHORT TERM GOALS: Target date: 4th OT Rx visit   Pt will demonstrate understanding of lymphedema precautions and prevention strategies with modified independence using a printed reference to identify at least 5 precautions and discussing how s/he may implement them into daily life to reduce risk of progression with extra time. Baseline:Max A Goal status:08/25/23 GOAL MET  2.  Pt will be able to apply multilayer, knee length, gradient, compression wraps to one leg at a time with modified assistance (extra time and assistive device/s) to decrease limb volume, to limit infection risk, and to limit lymphedema progression.  Baseline: Dependent Goal status: 07/17/23  GOAL MET  LONG TERM GOALS: Target date: 10/05/23  Given this patient's Intake score of 45 % on the functional outcomes FOTO tool, patient will experience an increase in function of 3 points to improve basic and instrumental ADLs performance, including lymphedema self-care.  Baseline: Max A Goal status: 08/25/23 PROGRESSING  2.  Given this patient's Intake score of 82.35% on the Lymphedema Life Impact Scale (LLIS), patient will experience a reduction of at least 5 points in her perceived level of functional impairment resulting from lymphedema to improve functional performance and quality of life (QOL). Baseline: 64.71% Goal status: 08/25/23 PROGRESSING  3.  Pt will achieve at least a 10% volume reduction in LLE to return limb to typical size and shape, to limit infection risk and LE progression, to decrease pain, to improve function. Baseline: Dependent Goal  status: 08/25/23 PROGRESSING: The L LEG is decreased in volume by 4.54 % since commencing OT for CDT on 07/14/23. The L THIGH is decreased in volume by 7.9%, and the overall L FULL LIMB reduction measures 7.3%.  4.  Pt will obtain proper compression garments/devices and achieve modified independence (extra time + assistive devices) with donning/doffing to optimize limb volume reductions and limit LE  progression over time. Baseline:  Goal status:08/25/23 PROGRESSING:  5.  During Intensive phase CDT , with modified independence, Pt will achieve at least 85% compliance with all lymphedema self-care home program components, including daily skin care, compression wraps and /or garments, simple self MLD and lymphatic pumping therex to habituate LE self care protocol  into ADLs for optimal LE self-management over time. Baseline: Dependent Goal status: 08/25/23 PROGRESSING:  OBJECTIVE IMPAIRMENTS: Abnormal gait, cardiopulmonary status limiting activity, decreased activity tolerance, decreased endurance, decreased knowledge of condition, decreased knowledge of use of DME, decreased mobility, and difficulty walking.   ACTIVITY LIMITATIONS: carrying, lifting, bending, sitting, standing, squatting, sleeping, stairs, transfers, bathing, dressing, reach over head, hygiene/grooming, and functional ambulation and mobility.  PARTICIPATION LIMITATIONS: meal prep, cleaning, laundry, interpersonal relationship, driving, shopping, community activity, occupation, and yard work  PERSONAL FACTORS: Behavior pattern, Past/current experiences, Time since onset of injury/illness/exacerbation, and 3+ co morbidities: also affect patient's functional outcome.   REHAB POTENTIAL: Good  EVALUATION COMPLEXITY: Moderate  PLAN: OT FREQUENCY: 2x/week  OT DURATION: 12 weeks and PRN  PLANNED INTERVENTIONS: Therapeutic exercises, Therapeutic activity, Patient/Family education, Self Care, DME instructions, Manual lymph drainage,  Compression bandaging, and Manual therapy, customer compression garment and device fitting.  PLAN FOR NEXT SESSION:  Complete custom compression garment and device specifications and measurements. Send to DME vendor for reauthorization and processing LLE Multilayer gradient compression wraps -toes to groin Pt edu re LE self-care- garment options and recommendations  Loel Dubonnet, MS, OTR/L, CLT-LANA 09/15/23 11:41 AM

## 2023-09-15 NOTE — Progress Notes (Signed)
Patient presented to clinic in expressed good health for influenza vaccination. After screening questions were passed he was given the injection in the right deltoid with no expressed discomfort. Patient had no signs/symptoms of an allergic response at the time of clinic departure. VIS was given and patient verbalized understanding of all risks and potential side effects as noted in that documentation.

## 2023-09-16 ENCOUNTER — Other Ambulatory Visit: Payer: Medicaid Other

## 2023-09-16 ENCOUNTER — Encounter: Payer: Medicaid Other | Admitting: Occupational Therapy

## 2023-09-16 DIAGNOSIS — M5416 Radiculopathy, lumbar region: Secondary | ICD-10-CM | POA: Diagnosis not present

## 2023-09-16 DIAGNOSIS — M5136 Other intervertebral disc degeneration, lumbar region: Secondary | ICD-10-CM | POA: Diagnosis not present

## 2023-09-17 ENCOUNTER — Ambulatory Visit: Payer: Medicaid Other | Admitting: Occupational Therapy

## 2023-09-21 ENCOUNTER — Encounter: Payer: Self-pay | Admitting: Dermatology

## 2023-09-21 ENCOUNTER — Ambulatory Visit: Payer: Medicaid Other | Admitting: Dermatology

## 2023-09-21 VITALS — BP 135/80 | HR 72

## 2023-09-21 DIAGNOSIS — L720 Epidermal cyst: Secondary | ICD-10-CM | POA: Diagnosis not present

## 2023-09-21 DIAGNOSIS — L918 Other hypertrophic disorders of the skin: Secondary | ICD-10-CM

## 2023-09-21 NOTE — Patient Instructions (Signed)
Cryotherapy Aftercare  Wash gently with soap and water everyday.   Apply Vaseline Jelly daily until healed.    Recommend daily broad spectrum sunscreen SPF 30+ to sun-exposed areas, reapply every 2 hours as needed. Call for new or changing lesions.  Staying in the shade or wearing long sleeves, sun glasses (UVA+UVB protection) and wide brim hats (4-inch brim around the entire circumference of the hat) are also recommended for sun protection.    Due to recent changes in healthcare laws, you may see results of your pathology and/or laboratory studies on MyChart before the doctors have had a chance to review them. We understand that in some cases there may be results that are confusing or concerning to you. Please understand that not all results are received at the same time and often the doctors may need to interpret multiple results in order to provide you with the best plan of care or course of treatment. Therefore, we ask that you please give [place] 2 business days to thoroughly review all your results before contacting the office for clarification. Should we see a critical lab result, you will be contacted sooner.   If You Need Anything After Your Visit  If you have any questions or concerns for your doctor, please call our main line at [phone] and press option 4 to reach your doctor's medical assistant. If no one answers, please leave a voicemail as directed and we will return your call as soon as possible. Messages left after 4 pm will be answered the following business day.   You may also send [place] a message via MyChart. We typically respond to MyChart messages within 1-2 business days.  For prescription refills, please ask your pharmacy to contact our office. Our fax number is [phone].  If you have an urgent issue when the clinic is closed that cannot wait until the next business day, you can page your doctor at the number below.    Please note that while we do our best to be  available for urgent issues outside of office hours, we are not available 24/7.   If you have an urgent issue and are unable to reach [place], you may choose to seek medical care at your doctor's office, retail clinic, urgent care center, or emergency room.  If you have a medical emergency, please immediately call 911 or go to the emergency department.  Pager Numbers  - Dr. Gwen Pounds: [phone]  - Dr. Roseanne Reno: [phone]  - Dr. Katrinka Blazing: [phone]   In the event of inclement weather, please call our main line at [phone] for an update on the status of any delays or closures.  Dermatology Medication Tips: Please keep the boxes that topical medications come in in order to help keep track of the instructions about where and how to use these. Pharmacies typically print the medication instructions only on the boxes and not directly on the medication tubes.   If your medication is too expensive, please contact our office at [phone] option 4 or send [place] a message through MyChart.   We are unable to tell what your co-pay for medications will be in advance as this is different depending on your insurance coverage. However, we may be able to find a substitute medication at lower cost or fill out paperwork to get insurance to cover a needed medication.   If a prior authorization is required to get your medication covered by your insurance company, please allow [place] 1-2 business days to complete this process.  Drug prices often vary depending on where the prescription is filled and some pharmacies may offer cheaper prices.  The website www.goodrx.com contains coupons for medications through different pharmacies. The prices here do not account for what the cost may be with help from insurance (it may be cheaper with your insurance), but the website can give you the price if you did not use any insurance.  - You can print the associated coupon and take it with your prescription to the pharmacy.   - You may also stop by our office during regular business hours and pick up a GoodRx coupon card.  - If you need your prescription sent electronically to a different pharmacy, notify our office through [facility] or by phone at [phone] option 4.     Si Usted Necesita Algo Despus de Su Visita  Tambin puede enviarnos un mensaje a travs de Clinical cytogeneticist. Por lo general respondemos a los mensajes de MyChart en el transcurso de 1 a 2 das hbiles.  Para renovar recetas, por favor pida a su farmacia que se ponga en contacto con nuestra oficina. Annie Sable de fax es [place] [phone].  Si tiene un asunto urgente cuando la clnica est cerrada y que no puede esperar hasta el siguiente da hbil, puede llamar/localizar a su doctor(a) al nmero que aparece a continuacin.   Por favor, tenga en cuenta que aunque hacemos todo lo posible para estar disponibles para asuntos urgentes fuera del horario de [place], no estamos disponibles las 24 horas del da, los 7 [address]  [address] de la [place].   Si tiene un problema urgente y no puede comunicarse con nosotros, puede optar por buscar atencin mdica  en el consultorio de su doctor(a), en una clnica privada, en un centro de atencin urgente o en una sala de emergencias.  Si tiene Engineer, drilling, por favor llame inmediatamente al 911 o vaya a la sala de emergencias.  Nmeros de bper  - Dr. Gwen Pounds: [phone]  - Dra. Roseanne Reno: [phone]  - Dr. Katrinka Blazing: [phone]   En caso de inclemencias del tiempo, por favor llame a Lacy Duverney principal al [phone] para una actualizacin sobre el [place] de cualquier retraso o cierre.  Consejos para la medicacin en dermatologa: Por favor, guarde las cajas en las que vienen los medicamentos de uso tpico para ayudarle a seguir las instrucciones sobre dnde y cmo usarlos. Las farmacias generalmente imprimen las instrucciones del medicamento slo en las cajas y no directamente en los tubos del  [place].   Si su medicamento es muy caro, por favor, pngase en contacto con Rolm Gala llamando al [phone] y presione la opcin 4 o envenos un mensaje a travs de Clinical cytogeneticist.   No podemos decirle cul ser su copago por los medicamentos por adelantado ya que esto es diferente dependiendo de la cobertura de su seguro. Sin embargo, es posible que podamos encontrar un medicamento sustituto a Audiological scientist un formulario para que el seguro cubra el medicamento que se considera necesario.   Si se requiere una autorizacin previa para que su compaa de seguros [place] su medicamento, por favor permtanos de 1 a 2 das hbiles para completar [address]  Los precios de los medicamentos varan con frecuencia dependiendo del Environmental consultant de dnde se surte la receta y alguna farmacias pueden ofrecer precios ms baratos.  El sitio web www.goodrx.com tiene cupones para medicamentos de Health and safety inspector. Los precios aqu no tienen en cuenta lo que podra costar con la ayuda del seguro (puede ser  ms barato con su seguro), pero el sitio web puede darle el precio si no Visual merchandiser.  - Puede imprimir el cupn correspondiente y llevarlo con su receta a la farmacia.  - Tambin puede pasar por nuestra oficina durante el horario de atencin regular y Education officer, museum una tarjeta de cupones de GoodRx.  - Si necesita que su receta se enve electrnicamente a una farmacia diferente, informe a nuestra oficina a travs de MyChart de [place] o por telfono llamando al [phone] y presione la opcin 4.

## 2023-09-21 NOTE — Progress Notes (Signed)
   Follow-Up Visit   Subjective  Jonathon Snow is a 62 y.o. male who presents for the following: Spots on left upper chest. Patient states "they popped up quickly". Non tender, denies itching, denies bleeding. No personal hx of skin cancer.   The patient has spots, moles and lesions to be evaluated, some may be new or changing and the patient may have concern these could be cancer.     The following portions of the chart were reviewed this encounter and updated as appropriate: medications, allergies, medical history  Review of Systems:  No other skin or systemic complaints except as noted in HPI or Assessment and Plan.  Objective  Well appearing patient in no apparent distress; mood and affect are within normal limits.  A focused examination was performed of the following areas: Chest   Relevant physical exam findings are noted in the Assessment and Plan.  Left Upper Chest x1 Fleshy, skin-colored pedunculated papule    Assessment & Plan   Milia  Acrochordon Left Upper Chest x1  Lesion frozen at test spot. DO NOT BILL INSURANCE  Destruction of lesion - Left Upper Chest x1 Complexity: simple   Destruction method: cryotherapy   Informed consent: discussed and consent obtained   Timeout:  patient name, date of birth, surgical site, and procedure verified Lesion destroyed using liquid nitrogen: Yes   Region frozen until ice ball extended beyond lesion: Yes   Cryotherapy cycles:  2 (1 or 2) Outcome: patient tolerated procedure well with no complications   Post-procedure details: wound care instructions given   Additional details:  Prior to procedure, discussed risks of blister formation, small wound, skin dyspigmentation, or rare scar following cryotherapy. Recommend Vaseline ointment to treated areas while healing.    Milia - tiny firm white papule at left upper chest - type of cyst - benign - sometimes these will clear with nightly OTC adapalene/Differin 0.1% gel or  retinol. - may be extracted if symptomatic - observe    Return if symptoms worsen or fail to improve.  I, Lawson Radar, CMA, am acting as scribe for Elie Goody, MD.   Documentation: I have reviewed the above documentation for accuracy and completeness, and I agree with the above.  Elie Goody, MD

## 2023-09-22 ENCOUNTER — Ambulatory Visit: Payer: Medicaid Other | Attending: Vascular Surgery | Admitting: Occupational Therapy

## 2023-09-22 ENCOUNTER — Encounter: Payer: Self-pay | Admitting: Occupational Therapy

## 2023-09-22 DIAGNOSIS — I890 Lymphedema, not elsewhere classified: Secondary | ICD-10-CM | POA: Insufficient documentation

## 2023-09-22 DIAGNOSIS — F332 Major depressive disorder, recurrent severe without psychotic features: Secondary | ICD-10-CM | POA: Diagnosis not present

## 2023-09-22 NOTE — Therapy (Deleted)
OUTPATIENT OCCUPATIONAL THERAPY TREATMENT NOTE  LOWER EXTREMITY LYMPHEDEMA  Patient Name: Jonathon Snow MRN: [id number] DOB:Oct 11, 1961, 62 y.o., male Today's Date: 09/22/2023  END OF SESSION:   OT End of Session - 09/15/23 0854     Visit Number 14    Number of Visits 36    Date for OT Re-Evaluation 10/05/23    OT Start Time 1115   OT Stop Time 1209   OT Time Calculation (min)    Activity Tolerance Patient tolerated treatment well;No increased pain      Behavior During Therapy [facility] for tasks assessed/performed              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   Overview:  superior sagittal sinus, left transverse sinus and cortical veins    COVID-19 virus infection 12/2020   Depression    DVT (deep venous thrombosis) (HCC)    Dyspnea    GERD (gastroesophageal reflux disease)    Heel spur, left 02/16/2019   Heel spur, right 02/16/2019   Herpes zoster infection of lumbar region 02/20/2020   History of kidney stones    Hyperlipidemia    Hypertension    Lupus    Lymphedema 10/07/2018   Morbid obesity (HCC)    Opiate abuse, episodic (HCC) 02/26/2018   Osteoporosis    Pneumonia    PONV (postoperative nausea and vomiting)    Postphlebitic syndrome with ulcer, 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   Presence of IVC filter 03/22/2020   Removed   Pulmonary embolism (HCC)    Renal disorder    Stage III   Past Surgical History:  Procedure Laterality Date   ANKLE SURGERY Right    BACK SURGERY     BRONCHIAL WASHINGS N/A 11/01/2021   Procedure: BRONCHIAL WASHINGS;  Surgeon: Vida Rigger, MD;  Location: [facility] ORS;  Service: Thoracic;  Laterality: N/A;   COLONOSCOPY WITH PROPOFOL N/A 05/28/2020   Procedure: COLONOSCOPY WITH PROPOFOL;  Surgeon: Wyline Mood, MD;  Location: [facility] ENDOSCOPY;  Service: Endoscopy;  Laterality: N/A;   COLONOSCOPY WITH PROPOFOL N/A 07/01/2023   Procedure: COLONOSCOPY WITH  PROPOFOL;  Surgeon: Wyline Mood, MD;  Location: [facility] ENDOSCOPY;  Service: Gastroenterology;  Laterality: N/A;   CYST EXCISION  92 or 93    Liver cyst removal [facility]   FLEXIBLE BRONCHOSCOPY N/A 11/01/2021   Procedure: FLEXIBLE BRONCHOSCOPY;  Surgeon: Vida Rigger, MD;  Location: [facility] ORS;  Service: Thoracic;  Laterality: N/A;   HIP PINNING,CANNULATED Left 02/19/2023   Procedure: PERCUTANEOUS FIXATION OF FEMORAL NECK;  Surgeon: Signa Kell, MD;  Location: [facility] ORS;  Service: Orthopedics;  Laterality: Left;   I & D EXTREMITY Right 04/29/2017   Procedure: IRRIGATION AND DEBRIDEMENT EXTREMITY;  Surgeon: Ricarda Frame, MD;  Location: [facility] ORS;  Service: General;  Laterality: Right;   IRRIGATION AND DEBRIDEMENT ABSCESS Left 04/29/2017   Procedure: IRRIGATION AND DEBRIDEMENT Scrotal ABSCESS;  Surgeon: Ricarda Frame, MD;  Location: [facility] ORS;  Service: General;  Laterality: Left;   POLYPECTOMY  07/01/2023   Procedure: POLYPECTOMY;  Surgeon: Wyline Mood, MD;  Location: [facility] ENDOSCOPY;  Service: Gastroenterology;;   Patient Active Problem List   Diagnosis Date Noted   Chronic bilateral low back pain with left-sided sciatica 07/07/2023   Severe episode of recurrent major depressive disorder, 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   Neuropathy 07/07/2023   Hx of colonic polyps 07/01/2023   Adenomatous polyp of colon 07/01/2023   SLE (systemic lupus erythematosus  related syndrome) (HCC) 02/19/2023   Closed left hip fracture, initial encounter (HCC) 02/18/2023   Pain and swelling of right lower extremity 02/03/2023   Erectile disorder 01/22/2023   Coronary artery disease of native artery of native heart with stable angina pectoris (HCC) 09/09/2022   Current moderate episode of major depressive disorder (HCC) 09/09/2022   Varicose 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 01/03/2022   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) 09/18/2021   Prediabetes 11/08/2020   Stage 3a chronic  kidney disease (HCC) 11/08/2020   Seasonal allergies 11/08/2020   Rhinosinusitis 11/08/2020   Long term current use of systemic steroids 11/08/2020   Anticoagulant disorder (HCC) 11/07/2020   Senile purpura (HCC) 11/07/2020   History of DVT (deep vein thrombosis) 03/22/2020   MDD (major depressive disorder), recurrent episode, mild (HCC) 09/12/2019   Other spondylosis with radiculopathy, lumbar region 02/16/2019   Osteoporosis 10/12/2018   Chronic venous insufficiency 10/07/2018   Lymphedema 10/07/2018   SLE glomerulonephritis syndrome, 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HCC) 03/03/2018   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) 10/29/2016   Mixed hyperlipidemia 01/15/2016   Primary hypertension 01/15/2016   Obesity (BMI 30.0-34.9) 01/15/2016   Long term current use of anticoagulant 10/04/2015   Systemic lupus erythematosus (HCC) 05/30/2015   COPD, moderate (HCC) 06/27/2014   History of pulmonary embolism 12/11/2013   Nodule of right lung 12/11/2013   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 We were able to get him early this morning after a previous appointment so he would not have to wait. He presents without compression in place. He denies LE related pain, but rates back pain at 6/10.  Pt reports he has a little more swelling today and bruising on the L foot. "I don't know where that's coming  from."  PERTINENT HISTORY: systemic Lupus, HTN, CVI, varicose veins w inflammation, CKD, stage III, Hx DVT, Hx PE, CAD, COPD, L hip Fx 2/24. 2019 IVC filter removed, post phlebitic syndrome w ulcer, ankle Fx-R, back sx, hx scrotal abscess, Major  depressive disorder. Medications with swelling side effect include amlodipine (NORVASC) , Gabapentin and Prednisone.  PAIN:  Are you having pain? No LE-related pain; Back pain 6/10 Pain location: back Pain description: burning, tingling ,tight, stabbing, heavy Aggravating factors: walking, standing, extended sitting with dependent positioning Relieving factors: elevation  PRECAUTIONS: Other: LYMPHEDEMA PRECAUTIONS: CARDIAC,  PULMONARY,  FALLS:  Has patient fallen in last 6 months? Yes. Number of falls 1. Resulted in L hip fracture  LIVING ENVIRONMENT: Lives with:  lives alone Lives in: House/apartment Stairs: No; External: 0 steps; none Has following equipment at home: shower chair  PRIOR LEVEL OF FUNCTION: Independent  PATIENT GOALS: "I'd like to get back to the condition I was in before all this started happening to me."   OBJECTIVE:  FOTO functional outcome measure. Intake score: 45%  Lymphedema Life Impact Scale (Lymphedema life Impact Scale) Intake: 82.35%  (The extent to which lymphedema -related problems impacted your life during the last week.)  COGNITION/ MOOD:  Within functional limits for tasks assessed   OBSERVATIONS / OTHER ASSESSMENTS: Mild, stage II, BLE lymphedema 2 CVI and suspected, long term lymphatic overload by systemic fluid accumulation   BLE COMPARATIVE LIMB VOLUMETRICS Initial 07/14/23   LANDMARK RIGHT  (dominant)  R LEG (A-D) 3615.6 ml  R THIGH (E-G) 4542.6 ml  R FULL LIMB (A-G) 8163.2 ml  Limb Volume differential (LVD)  %  Volume change since initial %  Volume change overall V  (Blank rows = not tested)  LANDMARK LEFT  (Rx)  L LEG (A-D) 4526.4 ml  L THIGH (E-G) 6246.3 ml  L FULL LIMB (A-G) 10822.7  ml  Limb Volume differential (LVD)  Leg LVD = 19.76%, L>R. Thigh LVD = 27.77%, L>R Full limb LVD = 24.6%, L>R  Volume change since initial %  Volume change overall %  (Blank rows = not tested)   LLE Volumetrics VISIT 10 :08/25/23  LANDMARK LEFT (Rx)  L LEG (A-D) 4230.5 ml  L THIGH (E-G) 5797.9  ml  L FULL LIMB (A-G) 10028.4  ml  Limb Volume differential (LVD)  %  Volume change since last measured on 07/14/23 The L LEG is decreased in volume by 4.54 % since commencing OT for CDT on 07/14/23. The L THIGH is decreased in volume by 7.9%, and the overall L FULL LIMB reduction measures 7.3%.   Volume change overall V  (Blank rows = not tested)   LLE Volumetrics VISIT 20: TBA  LANDMARK LEFT (Rx)  L LEG (A-D) 4230.5 ml  L THIGH (E-G) 5797.9  ml  L FULL LIMB (A-G) 10028.4  ml  Limb Volume differential (LVD)  %  Change since last measured   Volume change overall V  (Blank rows = not tested)   TODAY'S TREATMENT:  MLD and fibrosis techniques to LLE as established LLE multilayer  gradient compression wrapping- toes to groin  PATIENT EDUCATION: Continued Pt/ CG edu for lymphedema self care home program throughout session. Topics include outcome of comparative limb volumetrics- starting limb volume differentials (LVDs), technology and gradient techniques used for short stretch, multilayer compression wrapping, simple self-MLD, therapeutic lymphatic pumping exercises, skin/nail care, LE precautions,. compression garment recommendations and specifications, wear and care schedule and compression garment donning / doffing w assistive devices. Discussed progress towards all OT goals since commencing CDT. All questions answered to the Pt's satisfaction. Good return. Person educated: Patient  Education method: Explanation, Demonstration, and Handouts Education  comprehension: verbalized understanding, returned demonstration, verbal cues required, and needs further education   HOME EXERCISE PROGRAM: At least 2 x daily BLE Lymphatic Pumping There ex Daily BLE/BLQ Skin care to limit infection risk and increase skin excursion Compression Intensive stage compression: multilayer short stretch wraps with gradient techniques. One limb at a time. Thigh length, multi-layer gradient compression wrap to L leg from base of toes to groin using 1 each 8, 10 and 12 cm wide short stretch bandages over single layer of Rosidal foam on the leg in circumferential pattern, and 2 additional 12 cm wide bandages over Rosidal  to groin.  Self-management Phase Compression: Custom, flat knit, BLE compression knee highs. Consider Elvarex classic for daytime and Jobst Relax for HOS  3, LLE, Custom, Elvarex Classic , flat knit, ccl 2 ( 23-32 mmHg) compression thigh highs 3 Pr ccl 1 (18-21 mmHg) biker shorts 1 LLE thigh length Jobst Relax for HOS  Custom-made gradient compression garments and HOS devices are medically necessary in this case because they are uniquely sized and shaped to fit the exact dimensions of the affected extremities with deformities, and to provide accurate and consistent gradient compression and containment, essential to optimally managing this patient's symptoms of chronic, progressive lymphedema. Multiple custom compression garments are needed for optimal hygiene to limit infection risk. Custom compression garments should be replaced q 3-6 months When worn consistently for optimal lipo-lymphedema self-management over time.  ASSESSMENT: CLINICAL IMPRESSION:  Pt has quarter-sized, round, reddened bruise on dorsal surface of foot at base of 3rd and 4th toes. He also has diffuse, brownish bruising on lateral surface of foot adjacent to the met head.  Swelling appears well controlled and not increased compared w last visit. Pt is unsure of  how bruising occurred. It  does not appear to be a hemophage. Pt denies associated pain.  Continued LLE/LLQ MLD with simultaneous skin care w emphasis on fibrosis techniques at distal leg and ankle to improve skin excursion and flexibility. Good tolerance.  Provided Pt edu throughout the session re LE self care, , and then Applied compression wraps as established. Cont as per POC 2 x weekly .  SHORT TERM GOALS: Target date: 4th OT Rx visit   Pt will demonstrate understanding of lymphedema precautions and prevention strategies with modified independence using a printed reference to identify at least 5 precautions and discussing how s/he may implement them into daily life to reduce risk of progression with extra time. Baseline:Max A Goal status:08/25/23 GOAL MET  2.  Pt will be able to apply multilayer, knee length, gradient, compression wraps to one leg at a time with modified assistance (extra time and assistive device/s) to decrease limb volume, to limit infection risk, and to limit lymphedema progression.  Baseline: Dependent Goal status: 07/17/23  GOAL MET  LONG TERM GOALS: Target date: 10/05/23  Given this patient's Intake score of 45 % on the functional outcomes FOTO tool, patient will experience an increase in function of 3 points to improve basic and instrumental ADLs performance, including lymphedema self-care.  Baseline: Max A Goal status: 08/25/23 PROGRESSING  2.  Given this patient's Intake score of 82.35% on the Lymphedema Life Impact Scale (LLIS), patient will experience a reduction of at least 5 points in her perceived level of functional impairment resulting from lymphedema to improve functional performance and quality of life (QOL). Baseline: 64.71% Goal status: 08/25/23 PROGRESSING  3.  Pt will achieve at least a 10% volume reduction in LLE to return limb to typical size and shape, to limit infection risk and LE progression, to decrease pain, to improve function. Baseline: Dependent Goal status: 08/25/23  PROGRESSING: The L LEG is decreased in volume by 4.54 % since commencing OT for CDT on 07/14/23. The L THIGH is decreased in volume by 7.9%, and the overall L FULL LIMB reduction measures 7.3%.  4.  Pt will obtain proper compression garments/devices and achieve modified independence (extra time + assistive devices) with donning/doffing to optimize limb volume reductions and limit LE  progression over time. Baseline:  Goal status:08/25/23 PROGRESSING:  5.  During Intensive phase CDT , with modified independence, Pt will achieve at least 85% compliance with all lymphedema self-care home program components, including daily skin care, compression wraps and /or garments, simple self MLD and lymphatic pumping therex to habituate LE self care protocol  into ADLs for optimal LE self-management over time. Baseline: Dependent Goal status: 08/25/23 PROGRESSING:  OBJECTIVE IMPAIRMENTS: Abnormal gait, cardiopulmonary status limiting activity, decreased activity tolerance, decreased endurance, decreased knowledge of condition, decreased knowledge of use of DME, decreased mobility, and difficulty walking.   ACTIVITY LIMITATIONS: carrying, lifting, bending, sitting, standing, squatting, sleeping, stairs, transfers, bathing, dressing, reach over head, hygiene/grooming, and functional ambulation and mobility.  PARTICIPATION LIMITATIONS: meal prep, cleaning, laundry, interpersonal relationship, driving, shopping, community activity, occupation, and yard work  PERSONAL FACTORS: Behavior pattern, Past/current experiences, Time since onset of injury/illness/exacerbation, and 3+ co morbidities: also affect patient's functional outcome.   REHAB POTENTIAL: Good  EVALUATION COMPLEXITY: Moderate  PLAN: OT FREQUENCY: 2x/week  OT DURATION: 12 weeks and PRN  PLANNED INTERVENTIONS: Therapeutic exercises, Therapeutic activity, Patient/Family education, Self Care, DME instructions, Manual lymph drainage, Compression bandaging,  and Manual therapy, customer compression garment and device fitting.  PLAN FOR NEXT SESSION:  Complete custom compression garment and device specifications and measurements. Send to DME vendor for reauthorization and processing LLE Multilayer gradient compression wraps -toes to groin Pt edu re LE self-care- garment options and recommendations  Loel Dubonnet, MS, OTR/L, CLT-LANA 09/22/23 3:08 PM

## 2023-09-22 NOTE — Therapy (Signed)
OUTPATIENT OCCUPATIONAL THERAPY TREATMENT NOTE  LOWER EXTREMITY LYMPHEDEMA  Patient Name: Jonathon Snow MRN: [id number] DOB:[birth year], 62 y.o., male Today's Date: 09/22/2023  END OF SESSION:   OT End of Session - 09/22/23 1505     Visit Number 15    Number of Visits 36    Date for OT Re-Evaluation 10/05/23    OT Start Time 1115    OT Stop Time 1209    OT Time Calculation (min) 54 min    Activity Tolerance Patient tolerated treatment well;No increased pain   Pt moving constantly in seat and pursed lips bgreathing throughout eval session 2/2 reported back pain. 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   Overview:  superior sagittal sinus, left transverse sinus and cortical veins    COVID-19 virus infection 12/2020   Depression    DVT (deep venous thrombosis) (HCC)    Dyspnea    GERD (gastroesophageal reflux disease)    Heel spur, left 02/16/2019   Heel spur, right 02/16/2019   Herpes zoster infection of lumbar region 02/20/2020   History of kidney stones    Hyperlipidemia    Hypertension    Lupus    Lymphedema 10/07/2018   Morbid obesity (HCC)    Opiate abuse, episodic (HCC) 02/26/2018   Osteoporosis    Pneumonia    PONV (postoperative nausea and vomiting)    Postphlebitic syndrome with ulcer, 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   Presence of IVC filter 03/22/2020   Removed   Pulmonary embolism (HCC)    Renal disorder    Stage III   Past Surgical History:  Procedure Laterality Date   ANKLE SURGERY Right    BACK SURGERY     BRONCHIAL WASHINGS N/A 11/01/2021   Procedure: BRONCHIAL WASHINGS;  Surgeon: Vida Rigger, MD;  Location: [facility] ORS;  Service: Thoracic;  Laterality: N/A;   COLONOSCOPY WITH PROPOFOL N/A 05/28/2020   Procedure: COLONOSCOPY WITH PROPOFOL;  Surgeon: Wyline Mood, MD;   Location: [facility] ENDOSCOPY;  Service: Endoscopy;  Laterality: N/A;   COLONOSCOPY WITH PROPOFOL N/A 07/01/2023   Procedure: COLONOSCOPY WITH PROPOFOL;  Surgeon: Wyline Mood, MD;  Location: [facility] ENDOSCOPY;  Service: Gastroenterology;  Laterality: N/A;   CYST EXCISION  92 or 93    Liver cyst removal [facility]   FLEXIBLE BRONCHOSCOPY N/A 11/01/2021   Procedure: FLEXIBLE BRONCHOSCOPY;  Surgeon: Vida Rigger, MD;  Location: [facility] ORS;  Service: Thoracic;  Laterality: N/A;   HIP PINNING,CANNULATED Left 02/19/2023   Procedure: PERCUTANEOUS FIXATION OF FEMORAL NECK;  Surgeon: Signa Kell, MD;  Location: [facility] ORS;  Service: Orthopedics;  Laterality: Left;   I & D EXTREMITY Right 04/29/2017   Procedure: IRRIGATION AND DEBRIDEMENT EXTREMITY;  Surgeon: Ricarda Frame, MD;  Location: [facility] ORS;  Service: General;  Laterality: Right;   IRRIGATION AND DEBRIDEMENT ABSCESS Left 04/29/2017   Procedure: IRRIGATION AND DEBRIDEMENT Scrotal ABSCESS;  Surgeon: Ricarda Frame, MD;  Location: [facility] ORS;  Service: General;  Laterality: Left;   POLYPECTOMY  07/01/2023   Procedure: POLYPECTOMY;  Surgeon: Wyline Mood, MD;  Location: [facility] ENDOSCOPY;  Service: Gastroenterology;;   Patient Active Problem List   Diagnosis Date Noted   Chronic bilateral low back pain with left-sided sciatica 07/07/2023   Severe episode of recurrent major depressive disorder, without psychotic  features (HCC) 07/07/2023   Neuropathy 07/07/2023   Hx of colonic polyps 07/01/2023   Adenomatous polyp of colon 07/01/2023   SLE (systemic lupus erythematosus related syndrome) (HCC) 02/19/2023   Closed left hip fracture, initial encounter (HCC) 02/18/2023   Pain and swelling of right lower extremity 02/03/2023   Erectile disorder 01/22/2023   Coronary artery disease of native artery of native heart with stable angina pectoris (HCC) 09/09/2022   Current moderate episode of major depressive disorder (HCC) 09/09/2022   Varicose veins of left lower extremity  with inflammation 01/03/2022   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) 09/18/2021   Prediabetes 11/08/2020   Stage 3a chronic kidney disease (HCC) 11/08/2020   Seasonal allergies 11/08/2020   Rhinosinusitis 11/08/2020   Long term current use of systemic steroids 11/08/2020   Anticoagulant disorder (HCC) 11/07/2020   Senile purpura (HCC) 11/07/2020   History of DVT (deep vein thrombosis) 03/22/2020   MDD (major depressive disorder), recurrent episode, mild (HCC) 09/12/2019   Other spondylosis with radiculopathy, lumbar region 02/16/2019   Osteoporosis 10/12/2018   Chronic venous insufficiency 10/07/2018   Lymphedema 10/07/2018   SLE glomerulonephritis syndrome, 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HCC) 03/03/2018   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) 10/29/2016   Mixed hyperlipidemia 01/15/2016   Primary hypertension 01/15/2016   Obesity (BMI 30.0-34.9) 01/15/2016   Long term current use of anticoagulant 10/04/2015   Systemic lupus erythematosus (HCC) 05/30/2015   COPD, moderate (HCC) 06/27/2014   History of pulmonary embolism 12/11/2013   Nodule of right lung 12/11/2013   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  He presents without compression in place. He denies LE related pain, but rates back pain at 6/10.  Pt reports he has a little more swelling today and bruising on the L  foot. "I don't know where that's coming from."  PERTINENT HISTORY: systemic Lupus, HTN, CVI, varicose veins w inflammation, CKD, stage III, Hx DVT, Hx PE, CAD, COPD, L hip Fx 2/24. 2019 IVC filter removed, post phlebitic syndrome w ulcer,  ankle Fx-R, back sx, hx scrotal abscess, Major depressive disorder. Medications with swelling side effect include amlodipine (NORVASC) , Gabapentin and Prednisone.  PAIN:  Are you having pain? No LE-related pain; Back pain 6/10 Pain location: back Pain description: burning, tingling ,tight, stabbing, heavy Aggravating factors: walking, standing, extended sitting with dependent positioning Relieving factors: elevation  PRECAUTIONS: Other: LYMPHEDEMA PRECAUTIONS: CARDIAC,  PULMONARY,  FALLS:  Has patient fallen in last 6 months? Yes. Number of falls 1. Resulted in L hip fracture  LIVING ENVIRONMENT: Lives with:  lives alone Lives in: House/apartment Stairs: No; External: 0 steps; none Has following equipment at home: shower chair  PRIOR LEVEL OF FUNCTION: Independent  PATIENT GOALS: "I'd like to get back to the condition I was in before all this started happening to me."   OBJECTIVE:  FOTO functional outcome measure. Intake score: 45%  Lymphedema Life Impact Scale (Lymphedema life Impact Scale) Intake: 82.35%  (The extent to which lymphedema -related problems impacted your life during the last week.)  COGNITION/ MOOD:  Within functional limits for tasks assessed   OBSERVATIONS / OTHER ASSESSMENTS: Mild, stage II, BLE lymphedema 2 CVI and suspected, long term lymphatic overload by systemic fluid accumulation   BLE COMPARATIVE LIMB VOLUMETRICS Initial 07/14/23   LANDMARK RIGHT  (dominant)  R LEG (A-D) 3615.6 ml  R THIGH (E-G) 4542.6 ml  R FULL LIMB (A-G) 8163.2 ml  Limb Volume differential (LVD)  %  Volume change since initial %  Volume change overall V  (Blank rows = not tested)  LANDMARK LEFT  (Rx)  L LEG (A-D) 4526.4 ml  L THIGH  (E-G) 6246.3 ml  L FULL LIMB (A-G) 10822.7 ml  Limb Volume differential (LVD)  Leg LVD = 19.76%, L>R. Thigh LVD = 27.77%, L>R Full limb LVD = 24.6%, L>R  Volume change since initial %  Volume change overall %  (Blank rows = not tested)   LLE Volumetrics VISIT 10 :08/25/23  LANDMARK LEFT (Rx)  L LEG (A-D) 4230.5 ml  L THIGH (E-G) 5797.9  ml  L FULL LIMB (A-G) 10028.4  ml  Limb Volume differential (LVD)  %  Volume change since last measured on 07/14/23 The L LEG is decreased in volume by 4.54 % since commencing OT for CDT on 07/14/23. The L THIGH is decreased in volume by 7.9%, and the overall L FULL LIMB reduction measures 7.3%.   Volume change overall V  (Blank rows = not tested)   LLE Volumetrics VISIT 20: TBA  LANDMARK LEFT (Rx)  L LEG (A-D) 4230.5 ml  L THIGH (E-G) 5797.9  ml  L FULL LIMB (A-G) 10028.4  ml  Limb Volume differential (LVD)  %  Change since last measured   Volume change overall V  (Blank rows = not tested)   TODAY'S TREATMENT:  MLD and fibrosis techniques to LLE as established LLE multilayer  gradient compression wrapping- toes to groin  PATIENT EDUCATION: Continued Pt/ CG edu for lymphedema self care home program throughout session. Topics include outcome of comparative limb volumetrics- starting limb volume differentials (LVDs), technology and gradient techniques used for short stretch, multilayer compression wrapping, simple self-MLD, therapeutic lymphatic pumping exercises, skin/nail care, LE precautions,. compression garment recommendations and specifications, wear and care schedule and compression garment donning / doffing w assistive devices. Discussed progress towards all OT goals since commencing CDT. All questions answered to the Pt's satisfaction. Good return. Person educated: Patient  Education method: Explanation,  Demonstration, and Handouts Education comprehension: verbalized understanding, returned demonstration, verbal cues required, and needs further education   HOME EXERCISE PROGRAM: At least 2 x daily BLE Lymphatic Pumping There ex Daily BLE/BLQ Skin care to limit infection risk and increase skin excursion Compression Intensive stage compression: multilayer short stretch wraps with gradient techniques. One limb at a time. Thigh length, multi-layer gradient compression wrap to L leg from base of toes to groin using 1 each 8, 10 and 12 cm wide short stretch bandages over single layer of Rosidal foam on the leg in circumferential pattern, and 2 additional 12 cm wide bandages over Rosidal  to groin.  Self-management Phase Compression: Custom, flat knit, BLE compression knee highs. Consider Elvarex classic for daytime and Jobst Relax for HOS  3, LLE, Custom, Elvarex Classic , flat knit, ccl 2 ( 23-32 mmHg) compression thigh highs 3 Pr ccl 1 (18-21 mmHg) biker shorts 1 LLE thigh length Jobst Relax for HOS  Custom-made gradient compression garments and HOS devices are medically necessary in this case because they are uniquely sized and shaped to fit the exact dimensions of the affected extremities with deformities, and to provide accurate and consistent gradient compression and containment, essential to optimally managing this patient's symptoms of chronic, progressive lymphedema. Multiple custom compression garments are needed for optimal hygiene to limit infection risk. Custom compression garments should be replaced q 3-6 months When worn consistently for optimal lipo-lymphedema self-management over time.  ASSESSMENT: CLINICAL IMPRESSION:  Swelling appears well controlled and not increased compared w last visit. Pt is unsure of  how bruising occurred. It does not appear to be a hemophage. Pt denies associated pain.  Continued LLE/LLQ MLD with simultaneous skin care w emphasis on fibrosis techniques at distal  leg and ankle to improve skin excursion and flexibility. Good tolerance.  Provided Pt edu throughout the session re LE self care, , and then Applied compression wraps as established. Decreased to 1 x weekly to conserve OT visits for fitting.[name]  SHORT TERM GOALS: Target date: 4th OT Rx visit   Pt will demonstrate understanding of lymphedema precautions and prevention strategies with modified independence using a printed reference to identify at least 5 precautions and discussing how s/he may implement them into daily life to reduce risk of progression with extra time. Baseline:Max A Goal status:08/25/23 GOAL MET  2.  Pt will be able to apply multilayer, knee length, gradient, compression wraps to one leg at a time with modified assistance (extra time and assistive device/s) to decrease limb volume, to limit infection risk, and to limit lymphedema progression.  Baseline: Dependent Goal status: 07/17/23  GOAL MET  LONG TERM GOALS: Target date: 10/05/23  Given this patient's Intake score of 45 % on the functional outcomes FOTO tool, patient will experience an increase in function of 3 points to improve basic and instrumental ADLs performance, including  lymphedema self-care.  Baseline: Max A Goal status: 08/25/23 PROGRESSING  2.  Given this patient's Intake score of 82.35% on the Lymphedema Life Impact Scale (LLIS), patient will experience a reduction of at least 5 points in her perceived level of functional impairment resulting from lymphedema to improve functional performance and quality of life (QOL). Baseline: 64.71% Goal status: 08/25/23 PROGRESSING  3.  Pt will achieve at least a 10% volume reduction in LLE to return limb to typical size and shape, to limit infection risk and LE progression, to decrease pain, to improve function. Baseline: Dependent Goal status: 08/25/23 PROGRESSING: The L LEG is decreased in volume by 4.54 % since commencing OT for CDT on 07/14/23. The L THIGH is decreased in volume  by 7.9%, and the overall L FULL LIMB reduction measures 7.3%.  4.  Pt will obtain proper compression garments/devices and achieve modified independence (extra time + assistive devices) with donning/doffing to optimize limb volume reductions and limit LE  progression over time. Baseline:  Goal status:08/25/23 PROGRESSING:  5.  During Intensive phase CDT , with modified independence, Pt will achieve at least 85% compliance with all lymphedema self-care home program components, including daily skin care, compression wraps and /or garments, simple self MLD and lymphatic pumping therex to habituate LE self care protocol  into ADLs for optimal LE self-management over time. Baseline: Dependent Goal status: 08/25/23 PROGRESSING:  OBJECTIVE IMPAIRMENTS: Abnormal gait, cardiopulmonary status limiting activity, decreased activity tolerance, decreased endurance, decreased knowledge of condition, decreased knowledge of use of DME, decreased mobility, and difficulty walking.   ACTIVITY LIMITATIONS: carrying, lifting, bending, sitting, standing, squatting, sleeping, stairs, transfers, bathing, dressing, reach over head, hygiene/grooming, and functional ambulation and mobility.  PARTICIPATION LIMITATIONS: meal prep, cleaning, laundry, interpersonal relationship, driving, shopping, community activity, occupation, and yard work  PERSONAL FACTORS: Behavior pattern, Past/current experiences, Time since onset of injury/illness/exacerbation, and 3+ co morbidities: also affect patient's functional outcome.   REHAB POTENTIAL: Good  EVALUATION COMPLEXITY: Moderate  PLAN: OT FREQUENCY: 2x/week  OT DURATION: 12 weeks and PRN  PLANNED INTERVENTIONS: Therapeutic exercises, Therapeutic activity, Patient/Family education, Self Care, DME instructions, Manual lymph drainage, Compression bandaging, and Manual therapy, customer compression garment and device fitting.  PLAN FOR NEXT SESSION:  Complete custom compression  garment and device specifications and measurements. Send to DME vendor for reauthorization and processing LLE Multilayer gradient compression wraps -toes to groin Pt edu re LE self-care- garment options and recommendations  Loel Dubonnet, MS, OTR/L, CLT-LANA 09/22/23 3:09 PM

## 2023-09-24 ENCOUNTER — Ambulatory Visit: Payer: Medicaid Other | Admitting: Occupational Therapy

## 2023-09-24 DIAGNOSIS — F332 Major depressive disorder, recurrent severe without psychotic features: Secondary | ICD-10-CM | POA: Diagnosis not present

## 2023-09-29 ENCOUNTER — Encounter: Payer: Self-pay | Admitting: Nurse Practitioner

## 2023-09-29 ENCOUNTER — Ambulatory Visit: Payer: Medicaid Other | Admitting: Occupational Therapy

## 2023-09-29 ENCOUNTER — Telehealth: Payer: Self-pay | Admitting: *Deleted

## 2023-09-29 ENCOUNTER — Inpatient Hospital Stay: Payer: Medicaid Other | Attending: Internal Medicine

## 2023-09-29 ENCOUNTER — Inpatient Hospital Stay (HOSPITAL_BASED_OUTPATIENT_CLINIC_OR_DEPARTMENT_OTHER): Payer: Medicaid Other | Admitting: Nurse Practitioner

## 2023-09-29 VITALS — BP 137/82 | HR 76 | Temp 97.5°F | Ht 74.0 in | Wt 236.0 lb

## 2023-09-29 DIAGNOSIS — I890 Lymphedema, not elsewhere classified: Secondary | ICD-10-CM | POA: Diagnosis not present

## 2023-09-29 DIAGNOSIS — N183 Chronic kidney disease, stage 3 unspecified: Secondary | ICD-10-CM | POA: Insufficient documentation

## 2023-09-29 DIAGNOSIS — Z7901 Long term (current) use of anticoagulants: Secondary | ICD-10-CM | POA: Diagnosis not present

## 2023-09-29 DIAGNOSIS — N2581 Secondary hyperparathyroidism of renal origin: Secondary | ICD-10-CM | POA: Diagnosis not present

## 2023-09-29 DIAGNOSIS — N182 Chronic kidney disease, stage 2 (mild): Secondary | ICD-10-CM | POA: Diagnosis not present

## 2023-09-29 DIAGNOSIS — I10 Essential (primary) hypertension: Secondary | ICD-10-CM | POA: Diagnosis not present

## 2023-09-29 DIAGNOSIS — M3214 Glomerular disease in systemic lupus erythematosus: Secondary | ICD-10-CM | POA: Diagnosis not present

## 2023-09-29 DIAGNOSIS — I129 Hypertensive chronic kidney disease with stage 1 through stage 4 chronic kidney disease, or unspecified chronic kidney disease: Secondary | ICD-10-CM | POA: Diagnosis not present

## 2023-09-29 DIAGNOSIS — I825Y2 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: Secondary | ICD-10-CM | POA: Insufficient documentation

## 2023-09-29 LAB — CMP (CANCER CENTER ONLY)
ALT: 13 U/L (ref 0–44)
AST: 14 U/L — ABNORMAL LOW (ref 15–41)
Albumin: 3.5 g/dL (ref 3.5–5.0)
Alkaline Phosphatase: 61 U/L (ref 38–126)
Anion gap: 7 (ref 5–15)
BUN: 20 mg/dL (ref 8–23)
CO2: 21 mmol/L — ABNORMAL LOW (ref 22–32)
Calcium: 8.8 mg/dL — ABNORMAL LOW (ref 8.9–10.3)
Chloride: 109 mmol/L (ref 98–111)
Creatinine: 1.35 mg/dL — ABNORMAL HIGH (ref 0.61–1.24)
GFR, Estimated: 60 mL/min — ABNORMAL LOW (ref >60)
Glucose, Bld: 110 mg/dL — ABNORMAL HIGH (ref 70–99)
Potassium: 3.8 mmol/L (ref 3.5–5.1)
Sodium: 137 mmol/L (ref 135–145)
Total Bilirubin: 0.9 mg/dL (ref 0.3–1.2)
Total Protein: 6.6 g/dL (ref 6.5–8.1)

## 2023-09-29 LAB — CBC WITH DIFFERENTIAL (CANCER CENTER ONLY)
Abs Immature Granulocytes: 0.02 10*3/uL (ref 0.00–0.07)
Basophils Absolute: 0 10*3/uL (ref 0.0–0.1)
Basophils Relative: 1 %
Eosinophils Absolute: 0.1 10*3/uL (ref 0.0–0.5)
Eosinophils Relative: 1 %
HCT: 46.8 % (ref 39.0–52.0)
Hemoglobin: 14.8 g/dL (ref 13.0–17.0)
Immature Granulocytes: 1 %
Lymphocytes Relative: 13 %
Lymphs Abs: 0.6 10*3/uL — ABNORMAL LOW (ref 0.7–4.0)
MCH: 28.9 pg (ref 26.0–34.0)
MCHC: 31.6 g/dL (ref 30.0–36.0)
MCV: 91.4 fL (ref 80.0–100.0)
Monocytes Absolute: 0.4 10*3/uL (ref 0.1–1.0)
Monocytes Relative: 10 %
Neutro Abs: 3.2 10*3/uL (ref 1.7–7.7)
Neutrophils Relative %: 74 %
Platelet Count: 134 10*3/uL — ABNORMAL LOW (ref 150–400)
RBC: 5.12 MIL/uL (ref 4.22–5.81)
RDW: 15 % (ref 11.5–15.5)
WBC Count: 4.3 10*3/uL (ref 4.0–10.5)
nRBC: 0 % (ref 0.0–0.2)

## 2023-09-29 MED ORDER — APIXABAN 5 MG PO TABS: 5.0000 mg | ORAL_TABLET | Freq: Two times a day (BID) | ORAL | 1 refills | Status: DC

## 2023-09-29 NOTE — Progress Notes (Signed)
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 SLE/Nephritis [cellcept/prednisolone;~creat 1.5; Dr.Lateef]  Oncology History   No history exists.     HISTORY OF PRESENTING ILLNESS: Alone.  Ambulating independently.  COYT GOVONI 62 y.o. 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   He is followed by Dr Wyn Quaker for lymphedema. He denies interval blood clots. Reports compliance with eliquis. States lupus has been well controlled. Denies black or bloody stools. No hematuria.    Review of Systems  Constitutional:  Negative for chills, diaphoresis, fever, malaise/fatigue and weight loss.  HENT:  Negative for nosebleeds and sore throat.   Eyes:  Negative for double vision.  Respiratory:  Negative for cough, hemoptysis, sputum production, shortness of breath and wheezing.   Cardiovascular:  Positive for leg swelling. Negative for chest pain, palpitations and orthopnea.  Gastrointestinal:  Negative for abdominal pain, blood in stool, constipation, diarrhea, heartburn, melena, nausea and vomiting.  Genitourinary:  Negative for dysuria, frequency, hematuria and urgency.   Musculoskeletal:  Negative for back pain, falls and joint pain.  Skin:  Negative for rash.  Neurological:  Negative for dizziness, tingling, focal weakness, weakness and headaches.  Endo/Heme/Allergies:  Does not bruise/bleed easily.  Psychiatric/Behavioral:  Negative for depression. The patient is not nervous/anxious and does not have insomnia.      MEDICAL HISTORY:  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   Overview:  superior sagittal sinus, left transverse sinus and cortical veins    COVID-19 virus infection 12/2020   Depression    DVT (deep venous thrombosis) (HCC)    Dyspnea    GERD (gastroesophageal reflux disease)    Heel spur, left 02/16/2019   Heel spur, right 02/16/2019   Herpes zoster infection of lumbar region 02/20/2020   History of kidney stones    Hyperlipidemia    Hypertension    Lupus    Lymphedema 10/07/2018   Morbid obesity (HCC)    Opiate abuse, episodic (HCC) 02/26/2018   Osteoporosis    Pneumonia    PONV (postoperative nausea and vomiting)    Postphlebitic syndrome with ulcer, 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   Presence of IVC filter 03/22/2020   Removed   Pulmonary embolism (HCC)    Renal disorder    Stage III    SURGICAL HISTORY: Past Surgical History:  Procedure Laterality Date   ANKLE SURGERY Right    BACK SURGERY     BRONCHIAL WASHINGS N/A 11/01/2021   Procedure: BRONCHIAL WASHINGS;  Surgeon: Vida Rigger, MD;  Location: [facility] ORS;  Service: Thoracic;  Laterality: N/A;   COLONOSCOPY WITH PROPOFOL N/A 05/28/2020   Procedure: COLONOSCOPY WITH PROPOFOL;  Surgeon: Wyline Mood, MD;  Location: [facility] ENDOSCOPY;  Service: Endoscopy;  Laterality:  N/A;   COLONOSCOPY WITH PROPOFOL N/A 07/01/2023   Procedure: COLONOSCOPY WITH PROPOFOL;  Surgeon: Wyline Mood, MD;  Location: [facility] ENDOSCOPY;  Service: Gastroenterology;  Laterality: N/A;   CYST EXCISION  92 or 93    Liver cyst removal [facility]    FLEXIBLE BRONCHOSCOPY N/A 11/01/2021   Procedure: FLEXIBLE BRONCHOSCOPY;  Surgeon: Vida Rigger, MD;  Location: [facility] ORS;  Service: Thoracic;  Laterality: N/A;   HIP PINNING,CANNULATED Left 02/19/2023   Procedure: PERCUTANEOUS FIXATION OF FEMORAL NECK;  Surgeon: Signa Kell, MD;  Location: [facility] ORS;  Service: Orthopedics;  Laterality: Left;   I & D EXTREMITY Right 04/29/2017   Procedure: IRRIGATION AND DEBRIDEMENT EXTREMITY;  Surgeon: Ricarda Frame, MD;  Location: [facility] ORS;  Service: General;  Laterality: Right;   IRRIGATION AND DEBRIDEMENT ABSCESS Left 04/29/2017   Procedure: IRRIGATION AND DEBRIDEMENT Scrotal ABSCESS;  Surgeon: Ricarda Frame, MD;  Location: [facility] ORS;  Service: General;  Laterality: Left;   POLYPECTOMY  07/01/2023   Procedure: POLYPECTOMY;  Surgeon: Wyline Mood, MD;  Location: [facility] ENDOSCOPY;  Service: Gastroenterology;;    SOCIAL HISTORY: Social History   Socioeconomic History   Marital status: Divorced    Spouse name: Not on file   Number of children: Not on file   Years of education: Not on file   Highest education level: Not on file  Occupational History   Not on file  Tobacco Use   Smoking status: Never   Smokeless tobacco: Never  Vaping Use   Vaping status: Never Used  Substance and Sexual Activity   Alcohol use: No   Drug use: No    Comment: PT DENIES   Sexual activity: Yes  Other Topics Concern   Not on file  Social History Narrative   Not on file   Social Determinants of Health   Financial Resource Strain: Not on file  Food Insecurity: No Food Insecurity (02/19/2023)   Hunger Vital Sign    Worried About Running Out of Food in the Last Year: Never true    Ran Out of Food in the Last Year: Never true  Transportation Needs: No Transportation Needs (02/19/2023)   PRAPARE - Transportation    Lack of Transportation (Medical): No    Lack of Transportation (Non-Medical): No  Physical Activity: Not on file  Stress: Not on file  Social  Connections: Unknown (04/25/2022)   Received from [facility], [facility]   Social Network    Social Network: Not on file  Intimate Partner Violence: Not At Risk (02/19/2023)   Humiliation, Afraid, Rape, and Kick questionnaire    Fear of Current or Ex-Partner: No    Emotionally Abused: No    Physically Abused: No    Sexually Abused: No   FAMILY HISTORY: Family History  Problem Relation Age of Onset   Hypertension Father    Heart disease Father    Clotting disorder Mother    Kidney disease Brother    Heart attack Maternal Grandmother    Heart attack Maternal Grandfather    Heart attack Paternal Grandfather    ALLERGIES:  is allergic to enalapril and vicodin [hydrocodone-acetaminophen].  MEDICATIONS:  Current Outpatient Medications  Medication Sig Dispense Refill   albuterol (VENTOLIN HFA) 108 (90 Base) MCG/ACT inhaler Inhale 2 puffs into the lungs every 6 (six) hours as needed for wheezing or shortness of breath. 18 g 2   amLODipine (NORVASC) 5 MG tablet Take 1 tablet (5 mg total) by mouth daily. 90 tablet 1   apixaban (ELIQUIS) 5 MG  TABS tablet Take 1 tablet (5 mg total) by mouth 2 (two) times daily. 60 tablet 6   ARIPiprazole (ABILIFY) 2 MG tablet      cholecalciferol (VITAMIN D3) 25 MCG (1000 UNIT) tablet Take 1,000 Units by mouth daily.     gabapentin (NEURONTIN) 300 MG capsule Take 1 capsule by mouth 3 (three) times daily.     hydrOXYzine (ATARAX) 25 MG tablet Take 25 mg by mouth 2 (two) times daily.     levocetirizine (XYZAL) 5 MG tablet Take 1 tablet (5 mg total) by mouth every evening. 30 tablet 5   LORazepam (ATIVAN) 0.5 MG tablet Take 1 tablet (0.5 mg total) by mouth 2 (two) times daily as needed for anxiety. 20 tablet 0   losartan (COZAAR) 100 MG tablet Take 1 tablet (100 mg total) by mouth daily. 90 tablet 1   montelukast (SINGULAIR) 10 MG tablet Take 1 tablet (10 mg total) by mouth at bedtime. 90 tablet 3   Multiple Vitamins-Minerals (MULTIVITAMIN WITH MINERALS)  tablet Take 1 tablet by mouth daily.     mycophenolate (CELLCEPT) 500 MG tablet Take 1,000 mg by mouth 2 (two) times daily.     predniSONE (DELTASONE) 10 MG tablet Take 1 tablet by mouth daily.     rosuvastatin (CRESTOR) 10 MG tablet Take 1 tablet (10 mg total) by mouth daily. 90 tablet 3   sertraline (ZOLOFT) 100 MG tablet Take 1 tablet (100 mg total) by mouth daily. 30 tablet 3   tadalafil (CIALIS) 20 MG tablet Take 1 tablet (20 mg total) by mouth daily as needed for erectile dysfunction. 30 tablet 11   traMADol (ULTRAM) 50 MG tablet Take 1 tablet (50 mg total) by mouth every 6 (six) hours as needed for moderate pain. 30 tablet 0   No current facility-administered medications for this visit.     PHYSICAL EXAMINATION: Vitals:   09/29/23 0926  BP: 137/82  Pulse: 76  Temp: (!) 97.5 F (36.4 C)  SpO2: 98%   Filed Weights   09/29/23 0926  Weight: 236 lb (107 kg)   Physical Exam Vitals reviewed.  Constitutional:      Appearance: He is not ill-appearing.  Cardiovascular:     Rate and Rhythm: Normal rate and regular rhythm.  Pulmonary:     Effort: No respiratory distress.  Abdominal:     General: There is no distension.     Palpations: Abdomen is soft.     Tenderness: There is no guarding.  Musculoskeletal:        General: No deformity.     Right lower leg: No edema.     Left lower leg: Edema present.  Skin:    General: Skin is warm and dry.  Neurological:     Mental Status: He is alert and oriented to person, place, and time.  Psychiatric:        Mood and Affect: Mood normal.        Behavior: Behavior normal.      LABORATORY DATA:  I have reviewed the data as listed Lab Results  Component Value Date   WBC 4.3 09/29/2023   HGB 14.8 09/29/2023   HCT 46.8 09/29/2023   MCV 91.4 09/29/2023   PLT 134 (L) 09/29/2023   Recent Labs    10/01/22 1031 02/18/23 2214 02/20/23 0259 04/03/23 1253 09/29/23 0904  NA 137   < > 136 135 137  K 4.2   < > 4.8 4.4 3.8  CL 107    < >  108 103 109  CO2 24   < > 21* 23 21*  GLUCOSE 108*   < > 147* 109* 110*  BUN 24*   < > 34* 30* 20  CREATININE 1.37*   < > 1.62* 1.51* 1.35*  CALCIUM 8.8*   < > 8.2* 8.6* 8.8*  GFRNONAA 59*   < > 48* 52* 60*  PROT 7.2  --   --  7.6 6.6  ALBUMIN 3.7  --   --  4.0 3.5  AST 19  --   --  17 14*  ALT 21  --   --  17 13  ALKPHOS 87  --   --  115 61  BILITOT 0.7  --   --  0.8 0.9   < > = values in this interval not displayed.    RADIOGRAPHIC STUDIES: I have personally reviewed the radiological images as listed and agreed with the findings in the report. No results found.  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 Tolerating well. Had 1 fall w/ fracture but no recurrent falls. Continue. Refilled Eliquis.    # Mild chronic intermittent thrombocytopenia- ? Sec to immune process. Platelets remain mildly decreased. No additional work up for now. Monitor.    # History of nephritis/lupus/ CKD stage III-cellcept+prednisone following up with nephrology. Stable.     DISPOSITION: #  follow up in 6 months- labs (cbc, cmp), Dr Donneta Romberg- [place]  No problem-specific Assessment & Plan notes found for this encounter.  All questions were answered. The patient knows to call the clinic with any problems, questions or concerns.    Alinda Dooms, NP 09/29/2023

## 2023-09-29 NOTE — Telephone Encounter (Signed)
Medical clearance faxed back to [facility] for epidural injection.

## 2023-09-30 ENCOUNTER — Ambulatory Visit
Admission: RE | Admit: 2023-09-30 | Discharge: 2023-09-30 | Disposition: A | Discharge status: Home / Self Care | Payer: Medicaid Other | Source: Ambulatory Visit | Attending: Orthopedic Surgery

## 2023-09-30 DIAGNOSIS — M25552 Pain in left hip: Secondary | ICD-10-CM | POA: Diagnosis not present

## 2023-09-30 DIAGNOSIS — M7062 Trochanteric bursitis, left hip: Secondary | ICD-10-CM | POA: Diagnosis not present

## 2023-09-30 NOTE — Therapy (Signed)
OUTPATIENT OCCUPATIONAL THERAPY TREATMENT NOTE  LOWER EXTREMITY LYMPHEDEMA  Patient Name: Jonathon Snow MRN: [id number] DOB:1961-10-05, 62 y.o., male Today's Date: 09/30/2023  END OF SESSION:   OT End of Session - 09/29/23 1111     Visit Number 16    Number of Visits 36    Date for OT Re-Evaluation 10/05/23    OT Start Time 1105    OT Stop Time 1205    OT Time Calculation (min) 60 min    Activity Tolerance Patient tolerated treatment well;No increased pain   Pt moving constantly in seat and pursed lips bgreathing throughout eval session 2/2 reported back pain. 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   Overview:  superior sagittal sinus, left transverse sinus and cortical veins    COVID-19 virus infection 12/2020   Depression    DVT (deep venous thrombosis) (HCC)    Dyspnea    GERD (gastroesophageal reflux disease)    Heel spur, left 02/16/2019   Heel spur, right 02/16/2019   Herpes zoster infection of lumbar region 02/20/2020   History of kidney stones    Hyperlipidemia    Hypertension    Lupus    Lymphedema 10/07/2018   Morbid obesity (HCC)    Opiate abuse, episodic (HCC) 02/26/2018   Osteoporosis    Pneumonia    PONV (postoperative nausea and vomiting)    Postphlebitic syndrome with ulcer, 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   Presence of IVC filter 03/22/2020   Removed   Pulmonary embolism (HCC)    Renal disorder    Stage III   Past Surgical History:  Procedure Laterality Date   ANKLE SURGERY Right    BACK SURGERY     BRONCHIAL WASHINGS N/A 11/01/2021   Procedure: BRONCHIAL WASHINGS;  Surgeon: Vida Rigger, MD;  Location: [facility] ORS;  Service: Thoracic;  Laterality: N/A;   COLONOSCOPY WITH PROPOFOL N/A 05/28/2020   Procedure: COLONOSCOPY WITH PROPOFOL;  Surgeon: Wyline Mood, MD;   Location: [facility] ENDOSCOPY;  Service: Endoscopy;  Laterality: N/A;   COLONOSCOPY WITH PROPOFOL N/A 07/01/2023   Procedure: COLONOSCOPY WITH PROPOFOL;  Surgeon: Wyline Mood, MD;  Location: [facility] ENDOSCOPY;  Service: Gastroenterology;  Laterality: N/A;   CYST EXCISION  92 or 93    Liver cyst removal [facility]   FLEXIBLE BRONCHOSCOPY N/A 11/01/2021   Procedure: FLEXIBLE BRONCHOSCOPY;  Surgeon: Vida Rigger, MD;  Location: [facility] ORS;  Service: Thoracic;  Laterality: N/A;   HIP PINNING,CANNULATED Left 02/19/2023   Procedure: PERCUTANEOUS FIXATION OF FEMORAL NECK;  Surgeon: Signa Kell, MD;  Location: [facility] ORS;  Service: Orthopedics;  Laterality: Left;   I & D EXTREMITY Right 04/29/2017   Procedure: IRRIGATION AND DEBRIDEMENT EXTREMITY;  Surgeon: Ricarda Frame, MD;  Location: [facility] ORS;  Service: General;  Laterality: Right;   IRRIGATION AND DEBRIDEMENT ABSCESS Left 04/29/2017   Procedure: IRRIGATION AND DEBRIDEMENT Scrotal ABSCESS;  Surgeon: Ricarda Frame, MD;  Location: [facility] ORS;  Service: General;  Laterality: Left;   POLYPECTOMY  07/01/2023   Procedure: POLYPECTOMY;  Surgeon: Wyline Mood, MD;  Location: [facility] ENDOSCOPY;  Service: Gastroenterology;;   Patient Active Problem List   Diagnosis Date Noted   Chronic bilateral low back pain with left-sided sciatica 07/07/2023   Severe episode of recurrent major depressive disorder, without psychotic  features (HCC) 07/07/2023   Neuropathy 07/07/2023   Hx of colonic polyps 07/01/2023   Adenomatous polyp of colon 07/01/2023   SLE (systemic lupus erythematosus related syndrome) (HCC) 02/19/2023   Closed left hip fracture, initial encounter (HCC) 02/18/2023   Pain and swelling of right lower extremity 02/03/2023   Erectile disorder 01/22/2023   Coronary artery disease of native artery of native heart with stable angina pectoris (HCC) 09/09/2022   Current moderate episode of major depressive disorder (HCC) 09/09/2022   Varicose veins of left lower extremity  with inflammation 01/03/2022   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) 09/18/2021   Prediabetes 11/08/2020   Stage 3a chronic kidney disease (HCC) 11/08/2020   Seasonal allergies 11/08/2020   Rhinosinusitis 11/08/2020   Long term current use of systemic steroids 11/08/2020   Anticoagulant disorder (HCC) 11/07/2020   Senile purpura (HCC) 11/07/2020   History of DVT (deep vein thrombosis) 03/22/2020   MDD (major depressive disorder), recurrent episode, mild (HCC) 09/12/2019   Other spondylosis with radiculopathy, lumbar region 02/16/2019   Osteoporosis 10/12/2018   Chronic venous insufficiency 10/07/2018   Lymphedema 10/07/2018   SLE glomerulonephritis syndrome, 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HCC) 03/03/2018   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) 10/29/2016   Mixed hyperlipidemia 01/15/2016   Primary hypertension 01/15/2016   Obesity (BMI 30.0-34.9) 01/15/2016   Long term current use of anticoagulant 10/04/2015   Systemic lupus erythematosus (HCC) 05/30/2015   COPD, moderate (HCC) 06/27/2014   History of pulmonary embolism 12/11/2013   Nodule of right lung 12/11/2013   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  He presents without compression in place. He denies LE related pain, but rates back pain at 6/10.  Pt reports he has a little more swelling today and bruising on the L  foot. "I don't know where that's coming from."  PERTINENT HISTORY: systemic Lupus, HTN, CVI, varicose veins w inflammation, CKD, stage III, Hx DVT, Hx PE, CAD, COPD, L hip Fx 2/24. 2019 IVC filter removed, post phlebitic syndrome w ulcer,  ankle Fx-R, back sx, hx scrotal abscess, Major depressive disorder. Medications with swelling side effect include amlodipine (NORVASC) , Gabapentin and Prednisone.  PAIN:  Are you having pain? No LE-related pain; Back pain 6/10 Pain location: back Pain description: burning, tingling ,tight, stabbing, heavy Aggravating factors: walking, standing, extended sitting with dependent positioning Relieving factors: elevation  PRECAUTIONS: Other: LYMPHEDEMA PRECAUTIONS: CARDIAC,  PULMONARY,  FALLS:  Has patient fallen in last 6 months? Yes. Number of falls 1. Resulted in L hip fracture  LIVING ENVIRONMENT: Lives with:  lives alone Lives in: House/apartment Stairs: No; External: 0 steps; none Has following equipment at home: shower chair  PRIOR LEVEL OF FUNCTION: Independent  PATIENT GOALS: "I'd like to get back to the condition I was in before all this started happening to me."   OBJECTIVE:  FOTO functional outcome measure. Intake score: 45%  Lymphedema Life Impact Scale (Lymphedema life Impact Scale) Intake: 82.35%  (The extent to which lymphedema -related problems impacted your life during the last week.)  COGNITION/ MOOD:  Within functional limits for tasks assessed   OBSERVATIONS / OTHER ASSESSMENTS: Mild, stage II, BLE lymphedema 2 CVI and suspected, long term lymphatic overload by systemic fluid accumulation   BLE COMPARATIVE LIMB VOLUMETRICS Initial 07/14/23   LANDMARK RIGHT  (dominant)  R LEG (A-D) 3615.6 ml  R THIGH (E-G) 4542.6 ml  R FULL LIMB (A-G) 8163.2 ml  Limb Volume differential (LVD)  %  Volume change since initial %  Volume change overall V  (Blank rows = not tested)  LANDMARK LEFT  (Rx)  L LEG (A-D) 4526.4 ml  L THIGH  (E-G) 6246.3 ml  L FULL LIMB (A-G) 10822.7 ml  Limb Volume differential (LVD)  Leg LVD = 19.76%, L>R. Thigh LVD = 27.77%, L>R Full limb LVD = 24.6%, L>R  Volume change since initial %  Volume change overall %  (Blank rows = not tested)   LLE Volumetrics VISIT 10 :08/25/23  LANDMARK LEFT (Rx)  L LEG (A-D) 4230.5 ml  L THIGH (E-G) 5797.9  ml  L FULL LIMB (A-G) 10028.4  ml  Limb Volume differential (LVD)  %  Volume change since last measured on 07/14/23 The L LEG is decreased in volume by 4.54 % since commencing OT for CDT on 07/14/23. The L THIGH is decreased in volume by 7.9%, and the overall L FULL LIMB reduction measures 7.3%.   Volume change overall V  (Blank rows = not tested)   LLE Volumetrics VISIT 20: TBA  LANDMARK LEFT (Rx)  L LEG (A-D) 4230.5 ml  L THIGH (E-G) 5797.9  ml  L FULL LIMB (A-G) 10028.4  ml  Limb Volume differential (LVD)  %  Change since last measured   Volume change overall V  (Blank rows = not tested)   TODAY'S TREATMENT:  MLD and fibrosis techniques to LLE as established LLE multilayer  gradient compression wrapping- toes to groin  PATIENT EDUCATION: Continued Pt/ CG edu for lymphedema self care home program throughout session. Topics include outcome of comparative limb volumetrics- starting limb volume differentials (LVDs), technology and gradient techniques used for short stretch, multilayer compression wrapping, simple self-MLD, therapeutic lymphatic pumping exercises, skin/nail care, LE precautions,. compression garment recommendations and specifications, wear and care schedule and compression garment donning / doffing w assistive devices. Discussed progress towards all OT goals since commencing CDT. All questions answered to the Pt's satisfaction. Good return. Person educated: Patient  Education method: Explanation,  Demonstration, and Handouts Education comprehension: verbalized understanding, returned demonstration, verbal cues required, and needs further education   HOME EXERCISE PROGRAM: At least 2 x daily BLE Lymphatic Pumping There ex Daily BLE/BLQ Skin care to limit infection risk and increase skin excursion Compression Intensive stage compression: multilayer short stretch wraps with gradient techniques. One limb at a time. Thigh length, multi-layer gradient compression wrap to L leg from base of toes to groin using 1 each 8, 10 and 12 cm wide short stretch bandages over single layer of Rosidal foam on the leg in circumferential pattern, and 2 additional 12 cm wide bandages over Rosidal  to groin.  Self-management Phase Compression: Custom, flat knit, BLE compression knee highs. Consider Elvarex classic for daytime and Jobst Relax for HOS  3, LLE, Custom, Elvarex Classic , flat knit, ccl 2 ( 23-32 mmHg) compression thigh highs 3 Pr ccl 1 (18-21 mmHg) biker shorts 1 LLE thigh length Jobst Relax for HOS  Custom-made gradient compression garments and HOS devices are medically necessary in this case because they are uniquely sized and shaped to fit the exact dimensions of the affected extremities with deformities, and to provide accurate and consistent gradient compression and containment, essential to optimally managing this patient's symptoms of chronic, progressive lymphedema. Multiple custom compression garments are needed for optimal hygiene to limit infection risk. Custom compression garments should be replaced q 3-6 months When worn consistently for optimal lipo-lymphedema self-management over time.  ASSESSMENT: CLINICAL IMPRESSION:  Swelling appears well controlled and not increased compared w last visit. Pt is unsure of  how bruising occurred. It does not appear to be a hemophage.  Continued LLE/LLQ MLD with simultaneous skin care w emphasis on fibrosis techniques at distal leg and ankle to improve  skin excursion and flexibility. Good tolerance.  Provided Pt edu throughout the session re LE self care, , and then Applied compression wraps as established. Decreased to 1 x weekly to conserve OT visits for fitting.[name]  SHORT TERM GOALS: Target date: 4th OT Rx visit   Pt will demonstrate understanding of lymphedema precautions and prevention strategies with modified independence using a printed reference to identify at least 5 precautions and discussing how s/he may implement them into daily life to reduce risk of progression with extra time. Baseline:Max A Goal status:08/25/23 GOAL MET  2.  Pt will be able to apply multilayer, knee length, gradient, compression wraps to one leg at a time with modified assistance (extra time and assistive device/s) to decrease limb volume, to limit infection risk, and to limit lymphedema progression.  Baseline: Dependent Goal status: 07/17/23  GOAL MET  LONG TERM GOALS: Target date: 10/05/23  Given this patient's Intake score of 45 % on the functional outcomes FOTO tool, patient will experience an increase in function of 3 points to improve basic and instrumental ADLs performance, including lymphedema self-care.  Baseline:  Max A Goal status: 08/25/23 PROGRESSING  2.  Given this patient's Intake score of 82.35% on the Lymphedema Life Impact Scale (LLIS), patient will experience a reduction of at least 5 points in her perceived level of functional impairment resulting from lymphedema to improve functional performance and quality of life (QOL). Baseline: 64.71% Goal status: 08/25/23 PROGRESSING  3.  Pt will achieve at least a 10% volume reduction in LLE to return limb to typical size and shape, to limit infection risk and LE progression, to decrease pain, to improve function. Baseline: Dependent Goal status: 08/25/23 PROGRESSING: The L LEG is decreased in volume by 4.54 % since commencing OT for CDT on 07/14/23. The L THIGH is decreased in volume by 7.9%, and the overall L  FULL LIMB reduction measures 7.3%.  4.  Pt will obtain proper compression garments/devices and achieve modified independence (extra time + assistive devices) with donning/doffing to optimize limb volume reductions and limit LE  progression over time. Baseline:  Goal status:08/25/23 PROGRESSING:  5.  During Intensive phase CDT , with modified independence, Pt will achieve at least 85% compliance with all lymphedema self-care home program components, including daily skin care, compression wraps and /or garments, simple self MLD and lymphatic pumping therex to habituate LE self care protocol  into ADLs for optimal LE self-management over time. Baseline: Dependent Goal status: 08/25/23 PROGRESSING:  OBJECTIVE IMPAIRMENTS: Abnormal gait, cardiopulmonary status limiting activity, decreased activity tolerance, decreased endurance, decreased knowledge of condition, decreased knowledge of use of DME, decreased mobility, and difficulty walking.   ACTIVITY LIMITATIONS: carrying, lifting, bending, sitting, standing, squatting, sleeping, stairs, transfers, bathing, dressing, reach over head, hygiene/grooming, and functional ambulation and mobility.  PARTICIPATION LIMITATIONS: meal prep, cleaning, laundry, interpersonal relationship, driving, shopping, community activity, occupation, and yard work  PERSONAL FACTORS: Behavior pattern, Past/current experiences, Time since onset of injury/illness/exacerbation, and 3+ co morbidities: also affect patient's functional outcome.   REHAB POTENTIAL: Good  EVALUATION COMPLEXITY: Moderate  PLAN: OT FREQUENCY: 2x/week  OT DURATION: 12 weeks and PRN  PLANNED INTERVENTIONS: Therapeutic exercises, Therapeutic activity, Patient/Family education, Self Care, DME instructions, Manual lymph drainage, Compression bandaging, and Manual therapy, customer compression garment and device fitting.  PLAN FOR NEXT SESSION:  MLD as established Fit custom compression garments/ devices  ASAP LLE Multilayer gradient compression wraps -toes to groin Pt edu re LE self-care- garment options and recommendations  Loel Dubonnet, MS, OTR/L, CLT-LANA 09/30/23 2:48 PM

## 2023-10-01 ENCOUNTER — Ambulatory Visit: Payer: Medicaid Other | Admitting: Occupational Therapy

## 2023-10-02 ENCOUNTER — Ambulatory Visit: Payer: BLUE CROSS/BLUE SHIELD | Admitting: Nurse Practitioner

## 2023-10-02 ENCOUNTER — Other Ambulatory Visit: Payer: BLUE CROSS/BLUE SHIELD

## 2023-10-05 DIAGNOSIS — I890 Lymphedema, not elsewhere classified: Secondary | ICD-10-CM | POA: Diagnosis not present

## 2023-10-06 ENCOUNTER — Ambulatory Visit: Payer: Medicaid Other | Admitting: Occupational Therapy

## 2023-10-06 DIAGNOSIS — M48062 Spinal stenosis, lumbar region with neurogenic claudication: Secondary | ICD-10-CM | POA: Diagnosis not present

## 2023-10-06 DIAGNOSIS — M5416 Radiculopathy, lumbar region: Secondary | ICD-10-CM | POA: Diagnosis not present

## 2023-10-08 ENCOUNTER — Ambulatory Visit: Payer: Medicaid Other | Admitting: Occupational Therapy

## 2023-10-08 DIAGNOSIS — I890 Lymphedema, not elsewhere classified: Secondary | ICD-10-CM | POA: Diagnosis not present

## 2023-10-08 NOTE — Addendum Note (Signed)
Addended by: Judithann Sauger on: 10/08/2023 09:57 AM   Modules accepted: Orders

## 2023-10-08 NOTE — Therapy (Signed)
OUTPATIENT OCCUPATIONAL THERAPY TREATMENT NOTE  LOWER EXTREMITY LYMPHEDEMA  Patient Name: Jonathon Snow MRN: [id number] DOB:[birth year], 62 y.o., male Today's Date: 10/08/2023  END OF SESSION:   OT End of Session - 10/08/23 0932     Visit Number 17    Number of Visits 36    Date for OT Re-Evaluation 01/06/24    OT Start Time 0900    OT Stop Time 0935    OT Time Calculation (min) 35 min    Activity Tolerance Patient tolerated treatment well;No increased pain   Pt moving constantly in seat and pursed lips bgreathing throughout eval session 2/2 reported back pain. 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   Overview:  superior sagittal sinus, left transverse sinus and cortical veins    COVID-19 virus infection 12/2020   Depression    DVT (deep venous thrombosis) (HCC)    Dyspnea    GERD (gastroesophageal reflux disease)    Heel spur, left 02/16/2019   Heel spur, right 02/16/2019   Herpes zoster infection of lumbar region 02/20/2020   History of kidney stones    Hyperlipidemia    Hypertension    Lupus    Lymphedema 10/07/2018   Morbid obesity (HCC)    Opiate abuse, episodic (HCC) 02/26/2018   Osteoporosis    Pneumonia    PONV (postoperative nausea and vomiting)    Postphlebitic syndrome with ulcer, 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   Presence of IVC filter 03/22/2020   Removed   Pulmonary embolism (HCC)    Renal disorder    Stage III   Past Surgical History:  Procedure Laterality Date   ANKLE SURGERY Right    BACK SURGERY     BRONCHIAL WASHINGS N/A 11/01/2021   Procedure: BRONCHIAL WASHINGS;  Surgeon: Vida Rigger, MD;  Location: [facility] ORS;  Service: Thoracic;  Laterality: N/A;   COLONOSCOPY WITH PROPOFOL N/A 05/28/2020   Procedure: COLONOSCOPY WITH PROPOFOL;  Surgeon: Wyline Mood, MD;   Location: [facility] ENDOSCOPY;  Service: Endoscopy;  Laterality: N/A;   COLONOSCOPY WITH PROPOFOL N/A 07/01/2023   Procedure: COLONOSCOPY WITH PROPOFOL;  Surgeon: Wyline Mood, MD;  Location: [facility] END[facility]COPY;  Service: Gastroenterology;  Laterality: N/A;   CYST EXCISION  92 or 93    Liver cyst removal [facility]   FLEXIBLE BRONCHOSCOPY N/A 11/01/2021   Procedure: FLEXIBLE BRONCHOSCOPY;  Surgeon: Vida Rigger, MD;  Location: [facility] ORS;  Service: Thoracic;  Laterality: N/A;   HIP PINNING,CANNULATED Left 02/19/2023   Procedure: PERCUTANEOUS FIXATION OF FEMORAL NECK;  Surgeon: Signa Kell, MD;  Location: [facility] ORS;  Service: Orthopedics;  Laterality: Left;   I & D EXTREMITY Right 04/29/2017   Procedure: IRRIGATION AND DEBRIDEMENT EXTREMITY;  Surgeon: Ricarda Frame, MD;  Location: [facility] ORS;  Service: General;  Laterality: Right;   IRRIGATION AND DEBRIDEMENT ABSCESS Left 04/29/2017   Procedure: IRRIGATION AND DEBRIDEMENT Scrotal ABSCESS;  Surgeon: Ricarda Frame, MD;  Location: [facility] ORS;  Service: General;  Laterality: Left;   POLYPECTOMY  07/01/2023   Procedure: POLYPECTOMY;  Surgeon: Wyline Mood, MD;  Location: [facility] ENDOSCOPY;  Service: Gastroenterology;;   Patient Active Problem List   Diagnosis Date Noted   Chronic bilateral low back pain with left-sided sciatica 07/07/2023   Severe episode of recurrent major depressive disorder, without psychotic  features (HCC) 07/07/2023   Neuropathy 07/07/2023   Hx of colonic polyps 07/01/2023   Adenomatous polyp of colon 07/01/2023   SLE (systemic lupus erythematosus related syndrome) (HCC) 02/19/2023   Closed left hip fracture, initial encounter (HCC) 02/18/2023   Pain and swelling of right lower extremity 02/03/2023   Erectile disorder 01/22/2023   Coronary artery disease of native artery of native heart with stable angina pectoris (HCC) 09/09/2022   Current moderate episode of major depressive disorder (HCC) 09/09/2022   Varicose veins of left lower extremity  with inflammation 01/03/2022   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) 09/18/2021   Prediabetes 11/08/2020   Stage 3a chronic kidney disease (HCC) 11/08/2020   Seasonal allergies 11/08/2020   Rhinosinusitis 11/08/2020   Long term current use of systemic steroids 11/08/2020   Anticoagulant disorder (HCC) 11/07/2020   Senile purpura (HCC) 11/07/2020   History of DVT (deep vein thrombosis) 03/22/2020   MDD (major depressive disorder), recurrent episode, mild (HCC) 09/12/2019   Other spondylosis with radiculopathy, lumbar region 02/16/2019   Osteoporosis 10/12/2018   Chronic venous insufficiency 10/07/2018   Lymphedema 10/07/2018   SLE glomerulonephritis syndrome, 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HCC) 03/03/2018   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) 10/29/2016   Mixed hyperlipidemia 01/15/2016   Primary hypertension 01/15/2016   Obesity (BMI 30.0-34.9) 01/15/2016   Long term current use of anticoagulant 10/04/2015   Systemic lupus erythematosus (HCC) 05/30/2015   COPD, moderate (HCC) 06/27/2014   History of pulmonary embolism 12/11/2013   Nodule of right lung 12/11/2013   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  He presents without compression in place. He denies LE related pain, but rates back pain at 6/10.  Pt reports he has a little more swelling today and bruising on the L  foot. "I don't know where that's coming from."  PERTINENT HISTORY: systemic Lupus, HTN, CVI, varicose veins w inflammation, CKD, stage III, Hx DVT, Hx PE, CAD, COPD, L hip Fx 2/24. 2019 IVC filter removed, post phlebitic syndrome w ulcer,  ankle Fx-R, back sx, hx scrotal abscess, Major depressive disorder. Medications with swelling side effect include amlodipine (NORVASC) , Gabapentin and Prednisone.  PAIN:  Are you having pain? No LE-related pain; Back pain 6/10 Pain location: back Pain description: burning, tingling ,tight, stabbing, heavy Aggravating factors: walking, standing, extended sitting with dependent positioning Relieving factors: elevation  PRECAUTIONS: Other: LYMPHEDEMA PRECAUTIONS: CARDIAC,  PULMONARY,  FALLS:  Has patient fallen in last 6 months? Yes. Number of falls 1. Resulted in L hip fracture  LIVING ENVIRONMENT: Lives with:  lives alone Lives in: House/apartment Stairs: No; External: 0 steps; none Has following equipment at home: shower chair  PRIOR LEVEL OF FUNCTION: Independent  PATIENT GOALS: "I'd like to get back to the condition I was in before all this started happening to me."   OBJECTIVE:  FOTO functional outcome measure. Intake score: 45%  Lymphedema Life Impact Scale (Lymphedema life Impact Scale) Intake: 82.35%  (The extent to which lymphedema -related problems impacted your life during the last week.)  COGNITION/ MOOD:  Within functional limits for tasks assessed   OBSERVATIONS / OTHER ASSESSMENTS: Mild, stage II, BLE lymphedema 2 CVI and suspected, long term lymphatic overload by systemic fluid accumulation   BLE COMPARATIVE LIMB VOLUMETRICS Initial 07/14/23   LANDMARK RIGHT  (dominant)  R LEG (A-D) 3615.6 ml  R THIGH (E-G) 4542.6 ml  R FULL LIMB (A-G) 8163.2 ml  Limb Volume differential (LVD)  %  Volume change since initial %  Volume change overall V  (Blank rows = not tested)  LANDMARK LEFT  (Rx)  L LEG (A-D) 4526.4 ml  L THIGH  (E-G) 6246.3 ml  L FULL LIMB (A-G) 10822.7 ml  Limb Volume differential (LVD)  Leg LVD = 19.76%, L>R. Thigh LVD = 27.77%, L>R Full limb LVD = 24.6%, L>R  Volume change since initial %  Volume change overall %  (Blank rows = not tested)   LLE Volumetrics VISIT 10 :08/25/23  LANDMARK LEFT (Rx)  L LEG (A-D) 4230.5 ml  L THIGH (E-G) 5797.9  ml  L FULL LIMB (A-G) 10028.4  ml  Limb Volume differential (LVD)  %  Volume change since last measured on 07/14/23 The L LEG is decreased in volume by 4.54 % since commencing OT for CDT on 07/14/23. The L THIGH is decreased in volume by 7.9%, and the overall L FULL LIMB reduction measures 7.3%.   Volume change overall V  (Blank rows = not tested)   LLE Volumetrics VISIT 20: TBA  LANDMARK LEFT (Rx)  L LEG (A-D) 4230.5 ml  L THIGH (E-G) 5797.9  ml  L FULL LIMB (A-G) 10028.4  ml  Limb Volume differential (LVD)  %  Change since last measured   Volume change overall V  (Blank rows = not tested)   TODAY'S TREATMENT:  MLD and fibrosis techniques to LLE as established LLE multilayer  gradient compression wrapping- toes to groin  PATIENT EDUCATION: Continued Pt/ CG edu for lymphedema self care home program throughout session. Topics include outcome of comparative limb volumetrics- starting limb volume differentials (LVDs), technology and gradient techniques used for short stretch, multilayer compression wrapping, simple self-MLD, therapeutic lymphatic pumping exercises, skin/nail care, LE precautions,. compression garment recommendations and specifications, wear and care schedule and compression garment donning / doffing w assistive devices. Discussed progress towards all OT goals since commencing CDT. All questions answered to the Pt's satisfaction. Good return. Person educated: Patient  Education method: Explanation,  Demonstration, and Handouts Education comprehension: verbalized understanding, returned demonstration, verbal cues required, and needs further education   HOME EXERCISE PROGRAM: At least 2 x daily BLE Lymphatic Pumping There ex Daily BLE/BLQ Skin care to limit infection risk and increase skin excursion Compression Intensive stage compression: multilayer short stretch wraps with gradient techniques. One limb at a time. Thigh length, multi-layer gradient compression wrap to L leg from base of toes to groin using 1 each 8, 10 and 12 cm wide short stretch bandages over single layer of Rosidal foam on the leg in circumferential pattern, and 2 additional 12 cm wide bandages over Rosidal  to groin.  Self-management Phase Compression: Custom, flat knit, BLE compression knee highs. Consider Elvarex classic for daytime and Jobst Relax for HOS  3, LLE, Custom, Elvarex Classic , flat knit, ccl 2 ( 23-32 mmHg) compression thigh highs 3 Pr ccl 1 (18-21 mmHg) biker shorts 1 LLE thigh length Jobst Relax for HOS  Custom-made gradient compression garments and HOS devices are medically necessary in this case because they are uniquely sized and shaped to fit the exact dimensions of the affected extremities with deformities, and to provide accurate and consistent gradient compression and containment, essential to optimally managing this patient's symptoms of chronic, progressive lymphedema. Multiple custom compression garments are needed for optimal hygiene to limit infection risk. Custom compression garments should be replaced q 3-6 months When worn consistently for optimal lipo-lymphedema self-management over time.  ASSESSMENT: CLINICAL IMPRESSION:  Pt obtained garments from    vendor. Shorts are being remade my vendor. Pt is pleased with garment. I assessed garment today and am also pleased with fit. Pt is able to don and doff using correct techniques and assistive devices after final teaching. Pt has met all b 1 OT  goals for lymphedema care, and that is the volumetric reduction goal. That goal is , however, partially met with excellent results. Pt agrees with plan to schedule 3 month follow up to monitor condition , home program effectiveness and garment condition.    SHORT TERM GOALS: Target date: 4th OT Rx visit   Pt will demonstrate understanding of lymphedema precautions and prevention strategies with modified independence using a printed reference to identify at least 5 precautions and discussing how s/he may implement them into daily life to reduce risk of progression with extra time. Baseline:Max A Goal status:08/25/23 GOAL MET  2.  Pt will be able to apply multilayer, knee length, gradient, compression wraps to one leg at a time with modified assistance (extra time and assistive device/s) to decrease limb volume, to limit infection risk, and to limit lymphedema progression.  Baseline: Dependent Goal status: 07/17/23  GOAL MET  LONG TERM GOALS: Target date: 10/05/23  Given this patient's Intake score of 45 % on the functional outcomes FOTO tool, patient will experience an increase in function of 3 points  to improve basic and instrumental ADLs performance, including lymphedema self-care.  Baseline: 45% Final Score: 52%, increased 7% Goal status: 10/08/23 GOAL MET  2.  Given this patient's Intake score of 82.35% on the Lymphedema Life Impact Scale (LLIS), patient will experience a reduction of at least 5 points in her perceived level of functional impairment resulting from lymphedema to improve functional performance and quality of life (QOL). Baseline: 82.25% Final Score 50%, decreased 32.35% Goal status:10/08/23 GOAL MET  3.  Pt will achieve at least a 10% volume reduction in LLE to return limb to typical size and shape, to limit infection risk and LE progression, to decrease pain, to improve function. Baseline: Dependent Goal status:PARTIALLY MET.  The L LEG is decreased in volume by 4.54 % since  commencing OT for CDT on 07/14/23. The L THIGH is decreased in volume by 7.9%, and the overall L FULL LIMB reduction measures 7.3%.  4.  Pt will obtain proper compression garments/devices and achieve modified independence (extra time + assistive devices) with donning/doffing to optimize limb volume reductions and limit LE  progression over time. Baseline:  Goal status:10/08/23 GOAL MET  5.  During Intensive phase CDT , with modified independence, Pt will achieve at least 85% compliance with all lymphedema self-care home program components, including daily skin care, compression wraps and /or garments, simple self MLD and lymphatic pumping therex to habituate LE self care protocol  into ADLs for optimal LE self-management over time. Baseline: Dependent Goal status: 10/08/23 GOAL MET  OBJECTIVE IMPAIRMENTS: Abnormal gait, cardiopulmonary status limiting activity, decreased activity tolerance, decreased endurance, decreased knowledge of condition, decreased knowledge of use of DME, decreased mobility, and difficulty walking.   ACTIVITY LIMITATIONS: carrying, lifting, bending, sitting, standing, squatting, sleeping, stairs, transfers, bathing, dressing, reach over head, hygiene/grooming, and functional ambulation and mobility.  PARTICIPATION LIMITATIONS: meal prep, cleaning, laundry, interpersonal relationship, driving, shopping, community activity, occupation, and yard work  PERSONAL FACTORS: Behavior pattern, Past/current experiences, Time since onset of injury/illness/exacerbation, and 3+ co morbidities: also affect patient's functional outcome.   REHAB POTENTIAL: Good  EVALUATION COMPLEXITY: Moderate  PLAN: OT FREQUENCY: 2x/week  OT DURATION: 12 weeks and PRN  PLANNED INTERVENTIONS: Therapeutic exercises, Therapeutic activity, Patient/Family education, Self Care, DME instructions, Manual lymph drainage, Compression bandaging, and Manual therapy, customer compression garment and device  fitting.  PLAN FOR NEXT SESSION:  3 month f/u Pt will complete final fittings w vendor  Loel Dubonnet, MS, OTR/L, CLT-LANA 10/08/23 9:42 AM

## 2023-10-13 ENCOUNTER — Ambulatory Visit: Payer: Medicaid Other | Admitting: Occupational Therapy

## 2023-10-15 ENCOUNTER — Ambulatory Visit: Payer: Medicaid Other | Admitting: Occupational Therapy

## 2023-10-15 DIAGNOSIS — F332 Major depressive disorder, recurrent severe without psychotic features: Secondary | ICD-10-CM | POA: Diagnosis not present

## 2023-10-20 ENCOUNTER — Ambulatory Visit: Payer: Medicaid Other | Admitting: Occupational Therapy

## 2023-10-22 ENCOUNTER — Encounter: Payer: Medicaid Other | Admitting: Occupational Therapy

## 2023-10-27 ENCOUNTER — Ambulatory Visit: Payer: Medicaid Other | Admitting: Occupational Therapy

## 2023-10-27 DIAGNOSIS — M48062 Spinal stenosis, lumbar region with neurogenic claudication: Secondary | ICD-10-CM | POA: Diagnosis not present

## 2023-10-27 DIAGNOSIS — M5416 Radiculopathy, lumbar region: Secondary | ICD-10-CM | POA: Diagnosis not present

## 2023-10-28 ENCOUNTER — Ambulatory Visit: Payer: Medicaid Other | Admitting: Nurse Practitioner

## 2023-10-28 ENCOUNTER — Encounter: Payer: Self-pay | Admitting: Nurse Practitioner

## 2023-10-28 ENCOUNTER — Other Ambulatory Visit: Payer: Self-pay

## 2023-10-28 ENCOUNTER — Ambulatory Visit: Payer: Self-pay

## 2023-10-28 VITALS — BP 126/84 | HR 100 | Temp 97.8°F | Resp 16 | Ht 74.0 in | Wt 230.5 lb

## 2023-10-28 DIAGNOSIS — G629 Polyneuropathy, unspecified: Secondary | ICD-10-CM | POA: Diagnosis not present

## 2023-10-28 MED ORDER — GABAPENTIN 300 MG PO CAPS: 300.0000 mg | ORAL_CAPSULE | Freq: Three times a day (TID) | ORAL | 1 refills | Status: DC

## 2023-10-28 NOTE — Progress Notes (Signed)
BP 126/84   Pulse 100   Temp 97.8 F (36.6 C) (Oral)   Resp 16   Ht 6\' 2"  (1.88 m)   Wt 230 lb 8 oz (104.6 kg)   SpO2 98%   BMI 29.59 kg/m    Subjective:    Patient ID: Jonathon Snow, male    DOB: 1961/11/09, 62 y.o.   MRN: [id number]  HPI: Jonathon Snow is a 62 y.o. male  Chief Complaint  Patient presents with   Foot Pain    Bilateral foot pain, stinging   Hand Pain    Stinging that goes to elbow   Polyneuropathy: patient reports that he has bilateral foot pain and stinging and stinging in his hands that radiates to his elbow. He says that it started three days ago.  He denies any new injury. He does have a history of back problems and prediabetes. Discussed that it could be multiple things that could be causing these symptoms.  Patient has not been taking the gabapentin. He says he ran out.  Will send to pharmacy.  Plan is to get labs, and if negative will refer to neuro for nerve testing.   Relevant past medical, surgical, family and social history reviewed and updated as indicated. Interim medical history since our last visit reviewed. Allergies and medications reviewed and updated.  Review of Systems  Ten systems reviewed and is negative except as mentioned in HPI       Objective:    BP 126/84   Pulse 100   Temp 97.8 F (36.6 C) (Oral)   Resp 16   Ht 6\' 2"  (1.88 m)   Wt 230 lb 8 oz (104.6 kg)   SpO2 98%   BMI 29.59 kg/m   Wt Readings from Last 3 Encounters:  10/28/23 230 lb 8 oz (104.6 kg)  09/29/23 236 lb (107 kg)  08/14/23 236 lb 3.2 oz (107.1 kg)    Physical Exam  Constitutional: Patient appears well-developed and well-nourished. Obese  No distress.  HEENT: head atraumatic, normocephalic, pupils equal and reactive to light, neck supple Cardiovascular: Normal rate, regular rhythm and normal heart sounds.  No murmur heard. No BLE edema. Pulmonary/Chest: Effort normal and breath sounds normal. No respiratory distress. Abdominal: Soft.  There is no  tenderness. Psychiatric: Patient has a normal mood and affect. behavior is normal. Judgment and thought content normal.   Results for orders placed or performed in visit on 09/29/23  CMP (Cancer Center only)  Result Value Ref Range   Sodium 137 135 - 145 mmol/L   Potassium 3.8 3.5 - 5.1 mmol/L   Chloride 109 98 - 111 mmol/L   CO2 21 (L) 22 - 32 mmol/L   Glucose, Bld 110 (H) 70 - 99 mg/dL   BUN 20 8 - 23 mg/dL   Creatinine 4.09 (H) 8.11 - 1.24 mg/dL   Calcium 8.8 (L) 8.9 - 10.3 mg/dL   Total Protein 6.6 6.5 - 8.1 g/dL   Albumin 3.5 3.5 - 5.0 g/dL   AST 14 (L) 15 - 41 U/L   ALT 13 0 - 44 U/L   Alkaline Phosphatase 61 38 - 126 U/L   Total Bilirubin 0.9 0.3 - 1.2 mg/dL   GFR, Estimated 60 (L) >60 mL/min   Anion gap 7 5 - 15  CBC with Differential (Cancer Center Only)  Result Value Ref Range   WBC Count 4.3 4.0 - 10.5 K/uL   RBC 5.12 4.22 - 5.81 MIL/uL   Hemoglobin  14.8 13.0 - 17.0 g/dL   HCT 56.3 87.5 - 64.3 %   MCV 91.4 80.0 - 100.0 fL   MCH 28.9 26.0 - 34.0 pg   MCHC 31.6 30.0 - 36.0 g/dL   RDW 32.9 51.8 - 84.1 %   Platelet Count 134 (L) 150 - 400 K/uL   nRBC 0.0 0.0 - 0.2 %   Neutrophils Relative % 74 %   Neutro Abs 3.2 1.7 - 7.7 K/uL   Lymphocytes Relative 13 %   Lymphs Abs 0.6 (L) 0.7 - 4.0 K/uL   Monocytes Relative 10 %   Monocytes Absolute 0.4 0.1 - 1.0 K/uL   Eosinophils Relative 1 %   Eosinophils Absolute 0.1 0.0 - 0.5 K/uL   Basophils Relative 1 %   Basophils Absolute 0.0 0.0 - 0.1 K/uL   Immature Granulocytes 1 %   Abs Immature Granulocytes 0.02 0.00 - 0.07 K/uL      Assessment & Plan:   Problem List Items Addressed This Visit   None Visit Diagnoses     Polyneuropathy    -  Primary   restart gabapentin 300 mg TID,  getting labs.  may need to consider referral to neurology for nerve testing.   Relevant Medications   gabapentin (NEURONTIN) 300 MG capsule   Other Relevant Orders   Vitamin B12   CBC with Differential/Platelet   COMPLETE METABOLIC PANEL  WITH GFR   HIV Antibody (routine testing w rflx)   TSH   B. burgdorfi antibodies   Hemoglobin A1c        Follow up plan: Return if symptoms worsen or fail to improve.

## 2023-10-28 NOTE — Telephone Encounter (Signed)
  Chief Complaint: feels like needle sticks ankles, feet, leg arm  Symptoms: sx come and go except for the feet Frequency: 2 days Pertinent Negatives: Patient denies fever, rash Disposition: [] ED /[] Urgent Care (no appt availability in office) / [x] Appointment(In office/virtual)/ []  [place] [facility]/ [] Home Care/ [] Refused Recommended Disposition /[] [place] [facility]/ []  Follow-up with PCP Additional Notes: pt asking for appt tomorrow- pt scheduled Reason for Disposition  [1] Numbness or tingling on both sides of body AND [2] is a new symptom present > 24 hours  Answer Assessment - Initial Assessment Questions 1. SYMPTOM: "What is the main symptom you are concerned about?" (e.g., weakness, numbness)     Feels bunch of pins sometimes makes him wince thingling 2. ONSET: "When did this start?" (minutes, hours, days; while sleeping)     2 days 3. LAST NORMAL: "When was the last time you (the patient) were normal (no symptoms)?"     4. PATTERN "Does this come and go, or has it been constant since it started?"  "Is it present now?"     Comes and goes except for the ankle and feet 6. NEUROLOGIC SYMPTOMS: "Have you had any of the following symptoms: headache, dizziness, vision loss, double vision, changes in speech, unsteady on your feet?"    no 7. OTHER SYMPTOMS: "Do you have any other symptoms?"     no 8. PREGNANCY: "Is there any chance you are pregnant?" "When was your last menstrual period?"     N/a  Protocols used: Neurologic Deficit-A-AH

## 2023-10-29 ENCOUNTER — Ambulatory Visit: Payer: Medicaid Other | Admitting: Occupational Therapy

## 2023-10-29 ENCOUNTER — Encounter: Payer: Self-pay | Admitting: Nurse Practitioner

## 2023-10-29 ENCOUNTER — Ambulatory Visit: Payer: Medicaid Other | Admitting: Nurse Practitioner

## 2023-10-29 ENCOUNTER — Other Ambulatory Visit: Payer: Self-pay | Admitting: Nurse Practitioner

## 2023-10-29 DIAGNOSIS — R7303 Prediabetes: Secondary | ICD-10-CM

## 2023-10-30 LAB — CBC WITH DIFFERENTIAL/PLATELET
Absolute Lymphocytes: 360 {cells}/uL — ABNORMAL LOW (ref 850–3900)
Absolute Monocytes: 429 {cells}/uL (ref 200–950)
Basophils Absolute: 11 {cells}/uL (ref 0–200)
Basophils Relative: 0.2 %
Eosinophils Absolute: 11 {cells}/uL — ABNORMAL LOW (ref 15–500)
Eosinophils Relative: 0.2 %
HCT: 49.8 % (ref 38.5–50.0)
Hemoglobin: 16 g/dL (ref 13.2–17.1)
MCH: 29.1 pg (ref 27.0–33.0)
MCHC: 32.1 g/dL (ref 32.0–36.0)
MCV: 90.7 fL (ref 80.0–100.0)
MPV: 12.5 fL (ref 7.5–12.5)
Monocytes Relative: 8.1 %
Neutro Abs: 4489 {cells}/uL (ref 1500–7800)
Neutrophils Relative %: 84.7 %
Platelets: 147 10*3/uL (ref 140–400)
RBC: 5.49 10*6/uL (ref 4.20–5.80)
RDW: 14.4 % (ref 11.0–15.0)
Total Lymphocyte: 6.8 %
WBC: 5.3 10*3/uL (ref 3.8–10.8)

## 2023-10-30 LAB — COMPLETE METABOLIC PANEL WITH GFR
AG Ratio: 1.6 (calc) (ref 1.0–2.5)
ALT: 11 U/L (ref 9–46)
AST: 13 U/L (ref 10–35)
Albumin: 4.1 g/dL (ref 3.6–5.1)
Alkaline phosphatase (APISO): 71 U/L (ref 35–144)
BUN/Creatinine Ratio: 13 (calc) (ref 6–22)
BUN: 22 mg/dL (ref 7–25)
CO2: 24 mmol/L (ref 20–32)
Calcium: 9 mg/dL (ref 8.6–10.3)
Chloride: 106 mmol/L (ref 98–110)
Creat: 1.64 mg/dL — ABNORMAL HIGH (ref 0.70–1.35)
Globulin: 2.5 g/dL (ref 1.9–3.7)
Glucose, Bld: 97 mg/dL (ref 65–99)
Potassium: 4.8 mmol/L (ref 3.5–5.3)
Sodium: 142 mmol/L (ref 135–146)
Total Bilirubin: 0.6 mg/dL (ref 0.2–1.2)
Total Protein: 6.6 g/dL (ref 6.1–8.1)
eGFR: 47 mL/min/{1.73_m2} — ABNORMAL LOW (ref >=60)

## 2023-10-30 LAB — HIV ANTIBODY (ROUTINE TESTING W REFLEX): HIV 1&2 Ab, 4th Generation: NONREACTIVE

## 2023-10-30 LAB — TSH: TSH: 1.06 m[IU]/L (ref 0.40–4.50)

## 2023-10-30 LAB — HEMOGLOBIN A1C
Hgb A1c MFr Bld: 6 %{Hb} — ABNORMAL HIGH (ref <5.7)
Mean Plasma Glucose: 126 mg/dL
eAG (mmol/L): 7 mmol/L

## 2023-10-30 LAB — B. BURGDORFI ANTIBODIES: B burgdorferi Ab IgG+IgM: <0.9 {index}

## 2023-10-30 LAB — VITAMIN B12: Vitamin B-12: 274 pg/mL (ref 200–1100)

## 2023-11-03 ENCOUNTER — Ambulatory Visit (INDEPENDENT_AMBULATORY_CARE_PROVIDER_SITE_OTHER): Payer: Medicaid Other

## 2023-11-03 ENCOUNTER — Encounter: Payer: Medicaid Other | Admitting: Occupational Therapy

## 2023-11-03 DIAGNOSIS — F332 Major depressive disorder, recurrent severe without psychotic features: Secondary | ICD-10-CM | POA: Diagnosis not present

## 2023-11-03 DIAGNOSIS — E538 Deficiency of other specified B group vitamins: Secondary | ICD-10-CM

## 2023-11-03 MED ORDER — CYANOCOBALAMIN 1000 MCG/ML IJ SOLN
1000.0000 ug | Freq: Once | INTRAMUSCULAR | Status: AC
Start: 2023-11-03 — End: 2023-11-03
  Administered 2023-11-03: 1000 ug via INTRAMUSCULAR

## 2023-11-05 ENCOUNTER — Inpatient Hospital Stay: Payer: Medicaid Other

## 2023-11-05 ENCOUNTER — Inpatient Hospital Stay
Admission: EM | Admit: 2023-11-05 | Discharge: 2023-11-13 | DRG: 321 | Disposition: A | Discharge status: Home / Self Care | Payer: Medicaid Other | Attending: Osteopathic Medicine | Admitting: Osteopathic Medicine

## 2023-11-05 ENCOUNTER — Emergency Department: Payer: Medicaid Other

## 2023-11-05 ENCOUNTER — Encounter: Payer: Medicaid Other | Admitting: Occupational Therapy

## 2023-11-05 ENCOUNTER — Encounter
Admission: EM | Disposition: A | Discharge status: Home / Self Care | Payer: Self-pay | Source: Home / Self Care | Attending: Osteopathic Medicine

## 2023-11-05 DIAGNOSIS — I129 Hypertensive chronic kidney disease with stage 1 through stage 4 chronic kidney disease, or unspecified chronic kidney disease: Secondary | ICD-10-CM | POA: Diagnosis not present

## 2023-11-05 DIAGNOSIS — R001 Bradycardia, unspecified: Secondary | ICD-10-CM | POA: Diagnosis not present

## 2023-11-05 DIAGNOSIS — D631 Anemia in chronic kidney disease: Secondary | ICD-10-CM | POA: Diagnosis present

## 2023-11-05 DIAGNOSIS — S22008A Other fracture of unspecified thoracic vertebra, initial encounter for closed fracture: Secondary | ICD-10-CM | POA: Diagnosis present

## 2023-11-05 DIAGNOSIS — I9581 Postprocedural hypotension: Secondary | ICD-10-CM | POA: Diagnosis not present

## 2023-11-05 DIAGNOSIS — S22078K Other fracture of T9-T10 vertebra, subsequent encounter for fracture with nonunion: Secondary | ICD-10-CM | POA: Diagnosis not present

## 2023-11-05 DIAGNOSIS — R338 Other retention of urine: Secondary | ICD-10-CM | POA: Diagnosis not present

## 2023-11-05 DIAGNOSIS — Z888 Allergy status to other drugs, medicaments and biological substances status: Secondary | ICD-10-CM

## 2023-11-05 DIAGNOSIS — W1830XA Fall on same level, unspecified, initial encounter: Secondary | ICD-10-CM | POA: Diagnosis present

## 2023-11-05 DIAGNOSIS — N179 Acute kidney failure, unspecified: Secondary | ICD-10-CM | POA: Diagnosis not present

## 2023-11-05 DIAGNOSIS — S301XXD Contusion of abdominal wall, subsequent encounter: Secondary | ICD-10-CM | POA: Diagnosis not present

## 2023-11-05 DIAGNOSIS — N401 Enlarged prostate with lower urinary tract symptoms: Secondary | ICD-10-CM | POA: Diagnosis not present

## 2023-11-05 DIAGNOSIS — D696 Thrombocytopenia, unspecified: Secondary | ICD-10-CM | POA: Diagnosis not present

## 2023-11-05 DIAGNOSIS — M328 Other forms of systemic lupus erythematosus: Secondary | ICD-10-CM

## 2023-11-05 DIAGNOSIS — N528 Other male erectile dysfunction: Secondary | ICD-10-CM | POA: Diagnosis present

## 2023-11-05 DIAGNOSIS — Z79624 Long term (current) use of inhibitors of nucleotide synthesis: Secondary | ICD-10-CM

## 2023-11-05 DIAGNOSIS — I2582 Chronic total occlusion of coronary artery: Secondary | ICD-10-CM | POA: Diagnosis present

## 2023-11-05 DIAGNOSIS — N170 Acute kidney failure with tubular necrosis: Secondary | ICD-10-CM | POA: Diagnosis present

## 2023-11-05 DIAGNOSIS — I700 Atherosclerosis of aorta: Secondary | ICD-10-CM | POA: Diagnosis not present

## 2023-11-05 DIAGNOSIS — E785 Hyperlipidemia, unspecified: Secondary | ICD-10-CM

## 2023-11-05 DIAGNOSIS — F339 Major depressive disorder, recurrent, unspecified: Secondary | ICD-10-CM | POA: Diagnosis present

## 2023-11-05 DIAGNOSIS — Z841 Family history of disorders of kidney and ureter: Secondary | ICD-10-CM

## 2023-11-05 DIAGNOSIS — I130 Hypertensive heart and chronic kidney disease with heart failure and stage 1 through stage 4 chronic kidney disease, or unspecified chronic kidney disease: Secondary | ICD-10-CM | POA: Diagnosis present

## 2023-11-05 DIAGNOSIS — I5032 Chronic diastolic (congestive) heart failure: Secondary | ICD-10-CM | POA: Diagnosis present

## 2023-11-05 DIAGNOSIS — Z7952 Long term (current) use of systemic steroids: Secondary | ICD-10-CM

## 2023-11-05 DIAGNOSIS — R6889 Other general symptoms and signs: Secondary | ICD-10-CM | POA: Diagnosis not present

## 2023-11-05 DIAGNOSIS — N1831 Chronic kidney disease, stage 3a: Secondary | ICD-10-CM | POA: Diagnosis not present

## 2023-11-05 DIAGNOSIS — G8929 Other chronic pain: Secondary | ICD-10-CM | POA: Diagnosis present

## 2023-11-05 DIAGNOSIS — M329 Systemic lupus erythematosus, unspecified: Secondary | ICD-10-CM | POA: Diagnosis present

## 2023-11-05 DIAGNOSIS — J449 Chronic obstructive pulmonary disease, unspecified: Secondary | ICD-10-CM | POA: Diagnosis not present

## 2023-11-05 DIAGNOSIS — Z6829 Body mass index (BMI) 29.0-29.9, adult: Secondary | ICD-10-CM

## 2023-11-05 DIAGNOSIS — I200 Unstable angina: Secondary | ICD-10-CM

## 2023-11-05 DIAGNOSIS — I219 Acute myocardial infarction, unspecified: Secondary | ICD-10-CM

## 2023-11-05 DIAGNOSIS — S0990XA Unspecified injury of head, initial encounter: Secondary | ICD-10-CM | POA: Diagnosis not present

## 2023-11-05 DIAGNOSIS — N281 Cyst of kidney, acquired: Secondary | ICD-10-CM | POA: Diagnosis not present

## 2023-11-05 DIAGNOSIS — E669 Obesity, unspecified: Secondary | ICD-10-CM | POA: Diagnosis not present

## 2023-11-05 DIAGNOSIS — R579 Shock, unspecified: Secondary | ICD-10-CM

## 2023-11-05 DIAGNOSIS — S0101XA Laceration without foreign body of scalp, initial encounter: Secondary | ICD-10-CM | POA: Diagnosis present

## 2023-11-05 DIAGNOSIS — Z8616 Personal history of COVID-19: Secondary | ICD-10-CM | POA: Diagnosis not present

## 2023-11-05 DIAGNOSIS — M3214 Glomerular disease in systemic lupus erythematosus: Secondary | ICD-10-CM | POA: Diagnosis not present

## 2023-11-05 DIAGNOSIS — R197 Diarrhea, unspecified: Secondary | ICD-10-CM | POA: Diagnosis present

## 2023-11-05 DIAGNOSIS — S335XXA Sprain of ligaments of lumbar spine, initial encounter: Secondary | ICD-10-CM | POA: Diagnosis present

## 2023-11-05 DIAGNOSIS — Z8249 Family history of ischemic heart disease and other diseases of the circulatory system: Secondary | ICD-10-CM

## 2023-11-05 DIAGNOSIS — Z885 Allergy status to narcotic agent status: Secondary | ICD-10-CM

## 2023-11-05 DIAGNOSIS — M532X4 Spinal instabilities, thoracic region: Secondary | ICD-10-CM | POA: Diagnosis not present

## 2023-11-05 DIAGNOSIS — Z7901 Long term (current) use of anticoagulants: Secondary | ICD-10-CM

## 2023-11-05 DIAGNOSIS — F419 Anxiety disorder, unspecified: Secondary | ICD-10-CM

## 2023-11-05 DIAGNOSIS — W19XXXA Unspecified fall, initial encounter: Secondary | ICD-10-CM | POA: Diagnosis not present

## 2023-11-05 DIAGNOSIS — K219 Gastro-esophageal reflux disease without esophagitis: Secondary | ICD-10-CM | POA: Diagnosis not present

## 2023-11-05 DIAGNOSIS — R404 Transient alteration of awareness: Secondary | ICD-10-CM | POA: Diagnosis not present

## 2023-11-05 DIAGNOSIS — I959 Hypotension, unspecified: Secondary | ICD-10-CM | POA: Diagnosis not present

## 2023-11-05 DIAGNOSIS — I2121 ST elevation (STEMI) myocardial infarction involving left circumflex coronary artery: Secondary | ICD-10-CM | POA: Diagnosis not present

## 2023-11-05 DIAGNOSIS — K5900 Constipation, unspecified: Secondary | ICD-10-CM | POA: Diagnosis not present

## 2023-11-05 DIAGNOSIS — Z981 Arthrodesis status: Secondary | ICD-10-CM

## 2023-11-05 DIAGNOSIS — K5730 Diverticulosis of large intestine without perforation or abscess without bleeding: Secondary | ICD-10-CM | POA: Diagnosis not present

## 2023-11-05 DIAGNOSIS — Z751 Person awaiting admission to adequate facility elsewhere: Secondary | ICD-10-CM

## 2023-11-05 DIAGNOSIS — S5011XA Contusion of right forearm, initial encounter: Secondary | ICD-10-CM | POA: Diagnosis present

## 2023-11-05 DIAGNOSIS — Z86718 Personal history of other venous thrombosis and embolism: Secondary | ICD-10-CM

## 2023-11-05 DIAGNOSIS — I213 ST elevation (STEMI) myocardial infarction of unspecified site: Secondary | ICD-10-CM

## 2023-11-05 DIAGNOSIS — J9811 Atelectasis: Secondary | ICD-10-CM | POA: Diagnosis not present

## 2023-11-05 DIAGNOSIS — I10 Essential (primary) hypertension: Secondary | ICD-10-CM

## 2023-11-05 DIAGNOSIS — N2581 Secondary hyperparathyroidism of renal origin: Secondary | ICD-10-CM | POA: Diagnosis present

## 2023-11-05 DIAGNOSIS — R110 Nausea: Secondary | ICD-10-CM | POA: Diagnosis not present

## 2023-11-05 DIAGNOSIS — N1832 Chronic kidney disease, stage 3b: Secondary | ICD-10-CM | POA: Diagnosis present

## 2023-11-05 DIAGNOSIS — S22079A Unspecified fracture of T9-T10 vertebra, initial encounter for closed fracture: Secondary | ICD-10-CM | POA: Diagnosis not present

## 2023-11-05 DIAGNOSIS — J90 Pleural effusion, not elsewhere classified: Secondary | ICD-10-CM | POA: Diagnosis not present

## 2023-11-05 DIAGNOSIS — M4804 Spinal stenosis, thoracic region: Secondary | ICD-10-CM | POA: Diagnosis not present

## 2023-11-05 DIAGNOSIS — T40605A Adverse effect of unspecified narcotics, initial encounter: Secondary | ICD-10-CM | POA: Diagnosis not present

## 2023-11-05 DIAGNOSIS — I499 Cardiac arrhythmia, unspecified: Secondary | ICD-10-CM | POA: Diagnosis not present

## 2023-11-05 DIAGNOSIS — Z832 Family history of diseases of the blood and blood-forming organs and certain disorders involving the immune mechanism: Secondary | ICD-10-CM

## 2023-11-05 DIAGNOSIS — I2111 ST elevation (STEMI) myocardial infarction involving right coronary artery: Secondary | ICD-10-CM

## 2023-11-05 DIAGNOSIS — Z86711 Personal history of pulmonary embolism: Secondary | ICD-10-CM

## 2023-11-05 DIAGNOSIS — E663 Overweight: Secondary | ICD-10-CM | POA: Diagnosis present

## 2023-11-05 DIAGNOSIS — I872 Venous insufficiency (chronic) (peripheral): Secondary | ICD-10-CM | POA: Diagnosis present

## 2023-11-05 DIAGNOSIS — S22008K Other fracture of unspecified thoracic vertebra, subsequent encounter for fracture with nonunion: Secondary | ICD-10-CM | POA: Diagnosis not present

## 2023-11-05 DIAGNOSIS — S199XXA Unspecified injury of neck, initial encounter: Secondary | ICD-10-CM | POA: Diagnosis not present

## 2023-11-05 DIAGNOSIS — S301XXA Contusion of abdominal wall, initial encounter: Secondary | ICD-10-CM | POA: Diagnosis not present

## 2023-11-05 DIAGNOSIS — N183 Chronic kidney disease, stage 3 unspecified: Secondary | ICD-10-CM | POA: Diagnosis not present

## 2023-11-05 DIAGNOSIS — I2119 ST elevation (STEMI) myocardial infarction involving other coronary artery of inferior wall: Principal | ICD-10-CM

## 2023-11-05 DIAGNOSIS — S22078A Other fracture of T9-T10 vertebra, initial encounter for closed fracture: Secondary | ICD-10-CM | POA: Diagnosis not present

## 2023-11-05 DIAGNOSIS — E782 Mixed hyperlipidemia: Secondary | ICD-10-CM | POA: Diagnosis present

## 2023-11-05 DIAGNOSIS — R55 Syncope and collapse: Secondary | ICD-10-CM

## 2023-11-05 DIAGNOSIS — S22072A Unstable burst fracture of T9-T10 vertebra, initial encounter for closed fracture: Secondary | ICD-10-CM | POA: Diagnosis not present

## 2023-11-05 DIAGNOSIS — T508X5A Adverse effect of diagnostic agents, initial encounter: Secondary | ICD-10-CM | POA: Diagnosis not present

## 2023-11-05 DIAGNOSIS — Z79899 Other long term (current) drug therapy: Secondary | ICD-10-CM

## 2023-11-05 DIAGNOSIS — Z860101 Personal history of adenomatous and serrated colon polyps: Secondary | ICD-10-CM

## 2023-11-05 DIAGNOSIS — M5414 Radiculopathy, thoracic region: Secondary | ICD-10-CM | POA: Diagnosis not present

## 2023-11-05 DIAGNOSIS — I2510 Atherosclerotic heart disease of native coronary artery without angina pectoris: Secondary | ICD-10-CM

## 2023-11-05 DIAGNOSIS — S3991XA Unspecified injury of abdomen, initial encounter: Secondary | ICD-10-CM | POA: Diagnosis not present

## 2023-11-05 DIAGNOSIS — R0609 Other forms of dyspnea: Secondary | ICD-10-CM | POA: Diagnosis present

## 2023-11-05 DIAGNOSIS — F332 Major depressive disorder, recurrent severe without psychotic features: Secondary | ICD-10-CM

## 2023-11-05 HISTORY — DX: Unspecified injury of head, initial encounter: S09.90XA

## 2023-11-05 HISTORY — DX: ST elevation (STEMI) myocardial infarction of unspecified site: I21.3

## 2023-11-05 HISTORY — PX: CORONARY/GRAFT ACUTE MI REVASCULARIZATION: CATH118305

## 2023-11-05 HISTORY — PX: CORONARY ULTRASOUND/IVUS: CATH118244

## 2023-11-05 HISTORY — PX: LEFT HEART CATH AND CORONARY ANGIOGRAPHY: CATH118249

## 2023-11-05 HISTORY — DX: Acute myocardial infarction, unspecified: I21.9

## 2023-11-05 LAB — COMPREHENSIVE METABOLIC PANEL
ALT: 19 U/L (ref 0–44)
ALT: 17 U/L (ref 0–44)
AST: 21 U/L (ref 15–41)
AST: 17 U/L (ref 15–41)
Albumin: 4.4 g/dL (ref 3.5–5.0)
Albumin: 3.4 g/dL — ABNORMAL LOW (ref 3.5–5.0)
Alkaline Phosphatase: 73 U/L (ref 38–126)
Alkaline Phosphatase: 57 U/L (ref 38–126)
Anion gap: 10 (ref 5–15)
Anion gap: 6 (ref 5–15)
BUN: 24 mg/dL — ABNORMAL HIGH (ref 8–23)
BUN: 24 mg/dL — ABNORMAL HIGH (ref 8–23)
CO2: 25 mmol/L (ref 22–32)
CO2: 22 mmol/L (ref 22–32)
Calcium: 9.2 mg/dL (ref 8.9–10.3)
Calcium: 7.9 mg/dL — ABNORMAL LOW (ref 8.9–10.3)
Chloride: 105 mmol/L (ref 98–111)
Chloride: 109 mmol/L (ref 98–111)
Creatinine, Ser: 1.64 mg/dL — ABNORMAL HIGH (ref 0.61–1.24)
Creatinine, Ser: 1.5 mg/dL — ABNORMAL HIGH (ref 0.61–1.24)
GFR, Estimated: 47 mL/min — ABNORMAL LOW (ref >60)
GFR, Estimated: 53 mL/min — ABNORMAL LOW (ref >60)
Glucose, Bld: 119 mg/dL — ABNORMAL HIGH (ref 70–99)
Glucose, Bld: 96 mg/dL (ref 70–99)
Potassium: 3.9 mmol/L (ref 3.5–5.1)
Potassium: 4 mmol/L (ref 3.5–5.1)
Sodium: 140 mmol/L (ref 135–145)
Sodium: 137 mmol/L (ref 135–145)
Total Bilirubin: 1.2 mg/dL — ABNORMAL HIGH (ref <1.2)
Total Bilirubin: 1.2 mg/dL — ABNORMAL HIGH (ref <1.2)
Total Protein: 7.9 g/dL (ref 6.5–8.1)
Total Protein: 5.9 g/dL — ABNORMAL LOW (ref 6.5–8.1)

## 2023-11-05 LAB — CBC WITH DIFFERENTIAL/PLATELET
Abs Immature Granulocytes: 0.07 10*3/uL (ref 0.00–0.07)
Abs Immature Granulocytes: 0.03 10*3/uL (ref 0.00–0.07)
Basophils Absolute: 0 10*3/uL (ref 0.0–0.1)
Basophils Absolute: 0 10*3/uL (ref 0.0–0.1)
Basophils Relative: 0 %
Basophils Relative: 0 %
Eosinophils Absolute: 0 10*3/uL (ref 0.0–0.5)
Eosinophils Absolute: 0 10*3/uL (ref 0.0–0.5)
Eosinophils Relative: 1 %
Eosinophils Relative: 0 %
HCT: 52.9 % — ABNORMAL HIGH (ref 39.0–52.0)
HCT: 42.9 % (ref 39.0–52.0)
Hemoglobin: 16.8 g/dL (ref 13.0–17.0)
Hemoglobin: 14 g/dL (ref 13.0–17.0)
Immature Granulocytes: 1 %
Immature Granulocytes: 0 %
Lymphocytes Relative: 8 %
Lymphocytes Relative: 8 %
Lymphs Abs: 0.7 10*3/uL (ref 0.7–4.0)
Lymphs Abs: 0.7 10*3/uL (ref 0.7–4.0)
MCH: 29.6 pg (ref 26.0–34.0)
MCH: 29.5 pg (ref 26.0–34.0)
MCHC: 31.8 g/dL (ref 30.0–36.0)
MCHC: 32.6 g/dL (ref 30.0–36.0)
MCV: 93.1 fL (ref 80.0–100.0)
MCV: 90.5 fL (ref 80.0–100.0)
Monocytes Absolute: 0.4 10*3/uL (ref 0.1–1.0)
Monocytes Absolute: 0.8 10*3/uL (ref 0.1–1.0)
Monocytes Relative: 5 %
Monocytes Relative: 9 %
Neutro Abs: 6.8 10*3/uL (ref 1.7–7.7)
Neutro Abs: 7.2 10*3/uL (ref 1.7–7.7)
Neutrophils Relative %: 85 %
Neutrophils Relative %: 83 %
Platelets: 149 10*3/uL — ABNORMAL LOW (ref 150–400)
Platelets: 111 10*3/uL — ABNORMAL LOW (ref 150–400)
RBC: 5.68 MIL/uL (ref 4.22–5.81)
RBC: 4.74 MIL/uL (ref 4.22–5.81)
RDW: 15.5 % (ref 11.5–15.5)
RDW: 15.3 % (ref 11.5–15.5)
WBC: 8 10*3/uL (ref 4.0–10.5)
WBC: 8.8 10*3/uL (ref 4.0–10.5)
nRBC: 0 % (ref 0.0–0.2)
nRBC: 0 % (ref 0.0–0.2)

## 2023-11-05 LAB — HEPARIN LEVEL (UNFRACTIONATED): Heparin Unfractionated: >1.1 [IU]/mL — ABNORMAL HIGH (ref 0.30–0.70)

## 2023-11-05 LAB — GLUCOSE, CAPILLARY: Glucose-Capillary: 93 mg/dL (ref 70–99)

## 2023-11-05 LAB — TROPONIN I (HIGH SENSITIVITY)
Troponin I (High Sensitivity): 8 ng/L (ref <18)
Troponin I (High Sensitivity): 6 ng/L (ref <18)
Troponin I (High Sensitivity): 10 ng/L (ref <18)
Troponin I (High Sensitivity): 10 ng/L (ref <18)

## 2023-11-05 LAB — LIPID PANEL
Cholesterol: 199 mg/dL (ref 0–200)
HDL: 59 mg/dL (ref >40)
LDL Cholesterol: 119 mg/dL — ABNORMAL HIGH (ref 0–99)
Total CHOL/HDL Ratio: 3.4 {ratio}
Triglycerides: 105 mg/dL (ref <150)
VLDL: 21 mg/dL (ref 0–40)

## 2023-11-05 LAB — MRSA NEXT GEN BY PCR, NASAL: MRSA by PCR Next Gen: NOT DETECTED

## 2023-11-05 LAB — PHOSPHORUS: Phosphorus: 2.6 mg/dL (ref 2.5–4.6)

## 2023-11-05 LAB — MAGNESIUM: Magnesium: 2.1 mg/dL (ref 1.7–2.4)

## 2023-11-05 LAB — LIPASE, BLOOD: Lipase: 34 U/L (ref 11–51)

## 2023-11-05 LAB — POCT ACTIVATED CLOTTING TIME
Activated Clotting Time: 222 s
Activated Clotting Time: 245 s
Activated Clotting Time: 268 s
Activated Clotting Time: 279 s

## 2023-11-05 LAB — PROTIME-INR
INR: 1.2 (ref 0.8–1.2)
Prothrombin Time: 15.1 s (ref 11.4–15.2)

## 2023-11-05 LAB — APTT: aPTT: 23 s — ABNORMAL LOW (ref 24–36)

## 2023-11-05 SURGERY — CORONARY/GRAFT ACUTE MI REVASCULARIZATION
Anesthesia: Moderate Sedation

## 2023-11-05 MED ORDER — HYDRALAZINE HCL 20 MG/ML IJ SOLN: 10.0000 mg | INTRAMUSCULAR | Status: AC | PRN

## 2023-11-05 MED ORDER — SODIUM CHLORIDE 0.9 % IV SOLN: 250.0000 mL | INTRAVENOUS | Status: AC | PRN

## 2023-11-05 MED ORDER — MYCOPHENOLATE MOFETIL 250 MG PO CAPS
1000.0000 mg | ORAL_CAPSULE | Freq: Two times a day (BID) | ORAL | Status: DC
  Administered 2023-11-05: 1000 mg via ORAL
  Filled 2023-11-05 (×2): qty 4

## 2023-11-05 MED ORDER — SODIUM CHLORIDE 0.9 % IV SOLN: INTRAVENOUS | Status: DC

## 2023-11-05 MED ORDER — HEPARIN SODIUM (PORCINE) 1000 UNIT/ML IJ SOLN
INTRAMUSCULAR | Status: AC
  Filled 2023-11-05: qty 10

## 2023-11-05 MED ORDER — CLOPIDOGREL BISULFATE 75 MG PO TABS
ORAL_TABLET | ORAL | Status: AC
  Filled 2023-11-05: qty 8

## 2023-11-05 MED ORDER — NITROGLYCERIN 1 MG/10 ML FOR IR/CATH LAB
INTRA_ARTERIAL | Status: DC | PRN
  Administered 2023-11-05 (×2): 200 ug via INTRACORONARY

## 2023-11-05 MED ORDER — HEPARIN SODIUM (PORCINE) 1000 UNIT/ML IJ SOLN
INTRAMUSCULAR | Status: DC | PRN
  Administered 2023-11-05: 9000 [IU] via INTRAVENOUS
  Administered 2023-11-05: 3000 [IU] via INTRAVENOUS
  Administered 2023-11-05 (×2): 2000 [IU] via INTRAVENOUS

## 2023-11-05 MED ORDER — PREDNISONE 10 MG PO TABS: 10.0000 mg | ORAL_TABLET | Freq: Every day | ORAL | Status: DC

## 2023-11-05 MED ORDER — HYDROMORPHONE HCL 1 MG/ML IJ SOLN
0.5000 mg | Freq: Once | INTRAMUSCULAR | Status: AC
  Administered 2023-11-05: 0.5 mg via INTRAVENOUS
  Filled 2023-11-05: qty 1

## 2023-11-05 MED ORDER — ROSUVASTATIN CALCIUM 10 MG PO TABS
20.0000 mg | ORAL_TABLET | Freq: Every day | ORAL | Status: DC
  Administered 2023-11-06 – 2023-11-13 (×8): 20 mg via ORAL
  Filled 2023-11-05 (×8): qty 2

## 2023-11-05 MED ORDER — ASPIRIN 81 MG PO CHEW
CHEWABLE_TABLET | ORAL | Status: AC
  Filled 2023-11-05: qty 4

## 2023-11-05 MED ORDER — ASPIRIN 81 MG PO CHEW
81.0000 mg | CHEWABLE_TABLET | Freq: Every day | ORAL | Status: DC
  Administered 2023-11-06 – 2023-11-10 (×5): 81 mg via ORAL
  Filled 2023-11-05 (×5): qty 1

## 2023-11-05 MED ORDER — ASPIRIN 81 MG PO CHEW: 324.0000 mg | CHEWABLE_TABLET | Freq: Once | ORAL | Status: DC

## 2023-11-05 MED ORDER — FENTANYL CITRATE (PF) 100 MCG/2ML IJ SOLN
INTRAMUSCULAR | Status: DC | PRN
  Administered 2023-11-05 (×2): 25 ug via INTRAVENOUS

## 2023-11-05 MED ORDER — MIDAZOLAM HCL 2 MG/2ML IJ SOLN
INTRAMUSCULAR | Status: DC | PRN
  Administered 2023-11-05: 1 mg via INTRAVENOUS

## 2023-11-05 MED ORDER — CLOPIDOGREL BISULFATE 75 MG PO TABS: 75.0000 mg | ORAL_TABLET | Freq: Every day | ORAL | Status: DC

## 2023-11-05 MED ORDER — SODIUM CHLORIDE 0.9 % IV BOLUS
500.0000 mL | Freq: Once | INTRAVENOUS | Status: AC
  Administered 2023-11-05: 500 mL via INTRAVENOUS

## 2023-11-05 MED ORDER — VERAPAMIL HCL 2.5 MG/ML IV SOLN
INTRAVENOUS | Status: DC | PRN
  Administered 2023-11-05 (×2): 2.5 mg via INTRA_ARTERIAL

## 2023-11-05 MED ORDER — SODIUM CHLORIDE 0.9 % IV SOLN: INTRAVENOUS | Status: AC

## 2023-11-05 MED ORDER — SODIUM CHLORIDE 0.9% FLUSH: 3.0000 mL | INTRAVENOUS | Status: DC | PRN

## 2023-11-05 MED ORDER — IOHEXOL 350 MG/ML SOLN
100.0000 mL | Freq: Once | INTRAVENOUS | Status: AC | PRN
  Administered 2023-11-05: 100 mL via INTRAVENOUS

## 2023-11-05 MED ORDER — NOREPINEPHRINE 4 MG/250ML-% IV SOLN
INTRAVENOUS | Status: AC
  Administered 2023-11-05: 2 ug/min via INTRAVENOUS
  Filled 2023-11-05: qty 250

## 2023-11-05 MED ORDER — GABAPENTIN 300 MG PO CAPS
300.0000 mg | ORAL_CAPSULE | Freq: Three times a day (TID) | ORAL | Status: DC
  Administered 2023-11-05 – 2023-11-07 (×6): 300 mg via ORAL
  Filled 2023-11-05 (×7): qty 1

## 2023-11-05 MED ORDER — SERTRALINE HCL 50 MG PO TABS
100.0000 mg | ORAL_TABLET | Freq: Every day | ORAL | Status: DC
  Administered 2023-11-06 – 2023-11-12 (×7): 100 mg via ORAL
  Filled 2023-11-05 (×7): qty 2

## 2023-11-05 MED ORDER — TRAMADOL HCL 50 MG PO TABS
50.0000 mg | ORAL_TABLET | Freq: Four times a day (QID) | ORAL | Status: DC | PRN
  Administered 2023-11-05: 50 mg via ORAL
  Filled 2023-11-05: qty 1

## 2023-11-05 MED ORDER — MIDAZOLAM HCL 2 MG/2ML IJ SOLN
INTRAMUSCULAR | Status: AC
  Filled 2023-11-05: qty 2

## 2023-11-05 MED ORDER — SODIUM CHLORIDE 0.9% FLUSH
3.0000 mL | Freq: Two times a day (BID) | INTRAVENOUS | Status: DC
  Administered 2023-11-05 – 2023-11-13 (×15): 3 mL via INTRAVENOUS

## 2023-11-05 MED ORDER — ORAL CARE MOUTH RINSE: 15.0000 mL | OROMUCOSAL | Status: DC | PRN

## 2023-11-05 MED ORDER — SODIUM CHLORIDE 0.9 % IV SOLN
12.5000 mg | Freq: Once | INTRAVENOUS | Status: DC
  Filled 2023-11-05: qty 0.5

## 2023-11-05 MED ORDER — ALBUTEROL SULFATE (2.5 MG/3ML) 0.083% IN NEBU: 3.0000 mL | INHALATION_SOLUTION | Freq: Four times a day (QID) | RESPIRATORY_TRACT | Status: DC | PRN

## 2023-11-05 MED ORDER — ARIPIPRAZOLE 2 MG PO TABS
2.0000 mg | ORAL_TABLET | Freq: Every day | ORAL | Status: DC
Start: 2023-11-06 — End: 2023-11-13
  Administered 2023-11-06 – 2023-11-13 (×8): 2 mg via ORAL
  Filled 2023-11-05 (×8): qty 1

## 2023-11-05 MED ORDER — ONDANSETRON HCL 4 MG/2ML IJ SOLN
4.0000 mg | Freq: Four times a day (QID) | INTRAMUSCULAR | Status: DC | PRN
  Administered 2023-11-05 – 2023-11-11 (×6): 4 mg via INTRAVENOUS
  Filled 2023-11-05 (×6): qty 2

## 2023-11-05 MED ORDER — SODIUM CHLORIDE 0.9 % IV SOLN
INTRAVENOUS | Status: DC | PRN
  Administered 2023-11-05: 125 mL/h via INTRAVENOUS

## 2023-11-05 MED ORDER — CHLORHEXIDINE GLUCONATE CLOTH 2 % EX PADS
6.0000 | MEDICATED_PAD | Freq: Every day | CUTANEOUS | Status: DC
  Administered 2023-11-05 – 2023-11-09 (×4): 6 via TOPICAL

## 2023-11-05 MED ORDER — NOREPINEPHRINE 4 MG/250ML-% IV SOLN
2.0000 ug/min | INTRAVENOUS | Status: DC
  Administered 2023-11-07: 2 ug/min via INTRAVENOUS
  Filled 2023-11-05: qty 250

## 2023-11-05 MED ORDER — CLOPIDOGREL BISULFATE 75 MG PO TABS
ORAL_TABLET | ORAL | Status: DC | PRN
  Administered 2023-11-05: 600 mg via ORAL

## 2023-11-05 MED ORDER — HYDROXYZINE HCL 25 MG PO TABS
25.0000 mg | ORAL_TABLET | Freq: Two times a day (BID) | ORAL | Status: DC
  Administered 2023-11-06 – 2023-11-07 (×2): 25 mg via ORAL
  Filled 2023-11-05 (×4): qty 1

## 2023-11-05 MED ORDER — FENTANYL CITRATE (PF) 100 MCG/2ML IJ SOLN
INTRAMUSCULAR | Status: AC
  Filled 2023-11-05: qty 2

## 2023-11-05 MED ORDER — ACETAMINOPHEN 325 MG PO TABS
650.0000 mg | ORAL_TABLET | ORAL | Status: DC | PRN
  Administered 2023-11-05: 650 mg via ORAL
  Filled 2023-11-05: qty 2

## 2023-11-05 MED ORDER — LORATADINE 10 MG PO TABS
5.0000 mg | ORAL_TABLET | Freq: Every evening | ORAL | Status: DC
  Administered 2023-11-05 – 2023-11-12 (×7): 5 mg via ORAL
  Filled 2023-11-05 (×7): qty 1

## 2023-11-05 MED ORDER — IOHEXOL 300 MG/ML  SOLN
INTRAMUSCULAR | Status: DC | PRN
  Administered 2023-11-05: 96 mL

## 2023-11-05 MED ORDER — VERAPAMIL HCL 2.5 MG/ML IV SOLN
INTRAVENOUS | Status: AC
  Filled 2023-11-05: qty 2

## 2023-11-05 MED ORDER — ASPIRIN 81 MG PO CHEW
CHEWABLE_TABLET | ORAL | Status: DC | PRN
  Administered 2023-11-05: 324 mg via ORAL

## 2023-11-05 MED ORDER — NITROGLYCERIN 1 MG/10 ML FOR IR/CATH LAB
INTRA_ARTERIAL | Status: AC
  Filled 2023-11-05: qty 10

## 2023-11-05 MED ORDER — LORAZEPAM 0.5 MG PO TABS: 0.5000 mg | ORAL_TABLET | Freq: Two times a day (BID) | ORAL | Status: DC | PRN

## 2023-11-05 MED ORDER — LABETALOL HCL 5 MG/ML IV SOLN
10.0000 mg | INTRAVENOUS | Status: AC | PRN
Start: 2023-11-05 — End: 2023-11-05

## 2023-11-05 MED ORDER — HEPARIN (PORCINE) 25000 UT/250ML-% IV SOLN: 1250.0000 [IU]/h | INTRAVENOUS | Status: DC

## 2023-11-05 MED ORDER — MONTELUKAST SODIUM 10 MG PO TABS
10.0000 mg | ORAL_TABLET | Freq: Every day | ORAL | Status: DC
  Administered 2023-11-05 – 2023-11-12 (×8): 10 mg via ORAL
  Filled 2023-11-05 (×8): qty 1

## 2023-11-05 SURGICAL SUPPLY — 26 items
BALLN MINITREK RX 2.0X12 (BALLOONS) ×1
BALLN TREK RX 2.5X12 (BALLOONS) ×1
BALLN ~~LOC~~ TREK NEO RX 2.25X15 (BALLOONS)
BALLN ~~LOC~~ TREK NEO RX 3.25X15 (BALLOONS) ×1
BALLOON MINITREK RX 2.0X12 (BALLOONS) IMPLANT
BALLOON TREK RX 2.5X12 (BALLOONS) IMPLANT
BALLOON ~~LOC~~ TREK NEO RX 2.25X15 (BALLOONS) IMPLANT
BALLOON ~~LOC~~ TREK NEO RX 3.25X15 (BALLOONS) IMPLANT
CATH EAGLE EYE PLAT IMAGING (CATHETERS) IMPLANT
CATH INFINITI AMBI 5FR TG (CATHETERS) IMPLANT
CATH LAUNCHER 6FR EBU3.5 (CATHETERS) IMPLANT
CATH LAUNCHER 6FR JR4 (CATHETERS) IMPLANT
DEVICE RAD TR BAND REGULAR (VASCULAR PRODUCTS) IMPLANT
DRAPE BRACHIAL (DRAPES) IMPLANT
GLIDESHEATH SLEND SS 6F .021 (SHEATH) IMPLANT
GUIDEWIRE INQWIRE 1.5J.035X260 (WIRE) IMPLANT
INQWIRE 1.5J .035X260CM (WIRE) ×1
KIT ENCORE 26 ADVANTAGE (KITS) IMPLANT
KIT SYRINGE INJ CVI SPIKEX1 (MISCELLANEOUS) IMPLANT
PACK CARDIAC CATH (CUSTOM PROCEDURE TRAY) ×1 IMPLANT
PROTECTION STATION PRESSURIZED (MISCELLANEOUS) ×1
SET ATX-X65L (MISCELLANEOUS) IMPLANT
STATION PROTECTION PRESSURIZED (MISCELLANEOUS) IMPLANT
STENT ONYX FRONTIER 3.0X30 (Permanent Stent) IMPLANT
WIRE RUNTHROUGH .014X180CM (WIRE) IMPLANT
WIRE RUNTHROUGH IZANAI 014 180 (WIRE) IMPLANT

## 2023-11-05 NOTE — Consult Note (Signed)
Cardiology Consultation   Patient ID: Jonathon Snow MRN: [id number]; DOB: [birth year]  Admit date: 11/05/2023 Date of Consult: 11/05/2023  PCP:  Danelle Berry, PA-C   [facility] Providers Cardiologist:  Dossie Arbour, MD PhD   Patient Profile:   Jonathon Snow is a 62 y.o. 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 Larinda Buttery.  History of Present Illness:   Jonathon Snow was in his usual state of health, cooking this morning in his home.  He suddenly felt lightheaded and fell backwards, striking his head.  He believes that he passed out for short period of time and fell blood when he awoke.  He summoned EMS, who found Jonathon Snow to be hypotensive and bleeding from his scalp.  EKG showed inferior ST segment elevation, prompting activation of the STEMI team.  Upon arrival in the ED, active bleeding from posterior scalp laceration had ceased.  Jonathon Snow denied chest pain but complained of mid back pain as well as increased shortness of breath from baseline.  He also felt diaphoretic.  Blood pressure remained soft.  He notes being compliant with his medications, including apixaban, which he last took this morning.  He was referred for emergent CT head and cervical spine and then taken to the Cath Lab for emergent cardiac catheterization and possible PCI.  He was found to have multivessel CAD with subtotal occlusion of the proximal RCA that could not be wired and is likely chronic as well as hazy 80 to 90% distal LCx stenosis concerning for acute plaque rupture.  Moderate to severe but noncritical LAD and ramus intermedius disease was also noted.  Patient underwent PCI to LCx.  At this time, his only complaints are of occipital headache and back pain.   Past  Medical History:  Diagnosis Date   Calculus of kidney 08/21/2013   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   Overview:  superior sagittal sinus, left transverse sinus and cortical veins    COVID-19 virus infection 12/2020   Depression    DVT (deep venous thrombosis) (HCC)    Dyspnea    GERD (gastroesophageal reflux disease)    Heel spur, left 02/16/2019   Heel spur, right 02/16/2019   Herpes zoster infection of lumbar region 02/20/2020   History of kidney stones    Hyperlipidemia    Hypertension    Lupus    Lymphedema 10/07/2018   Morbid obesity (HCC)    Opiate abuse, episodic (HCC) 02/26/2018   Osteoporosis    Pneumonia    PONV (postoperative nausea and vomiting)    Postphlebitic syndrome with ulcer, 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   Presence of IVC filter 03/22/2020   Removed   Pulmonary embolism (HCC)    Renal disorder    Stage III    Past Surgical History:  Procedure Laterality Date   ANKLE SURGERY Right    BACK SURGERY     BRONCHIAL WASHINGS N/A 11/01/2021   Procedure: BRONCHIAL WASHINGS;  Surgeon: Vida Rigger, MD;  Location: [facility] ORS;  Service: Thoracic;  Laterality: N/A;   COLONOSCOPY WITH PROPOFOL N/A 05/28/2020   Procedure: COLONOSCOPY WITH PROPOFOL;  Surgeon: Wyline Mood, MD;  Location: [facility] ENDOSCOPY;  Service: Endoscopy;  Laterality: N/A;   COLONOSCOPY WITH PROPOFOL N/A 07/01/2023   Procedure: COLONOSCOPY WITH PROPOFOL;  Surgeon:  Wyline Mood, MD;  Location: [facility] ENDOSCOPY;  Service: Gastroenterology;  Laterality: N/A;   CYST EXCISION  92 or 93    Liver cyst removal [facility]   FLEXIBLE BRONCHOSCOPY N/A 11/01/2021   Procedure: FLEXIBLE BRONCHOSCOPY;  Surgeon: Vida Rigger, MD;  Location: [facility] ORS;  Service: Thoracic;  Laterality: N/A;   HIP PINNING,CANNULATED Left 02/19/2023   Procedure: PERCUTANEOUS FIXATION OF FEMORAL NECK;  Surgeon: Signa Kell, MD;  Location: [facility] ORS;  Service: Orthopedics;  Laterality: Left;   I & D EXTREMITY Right 04/29/2017   Procedure:  IRRIGATION AND DEBRIDEMENT EXTREMITY;  Surgeon: Ricarda Frame, MD;  Location: [facility] ORS;  Service: General;  Laterality: Right;   IRRIGATION AND DEBRIDEMENT ABSCESS Left 04/29/2017   Procedure: IRRIGATION AND DEBRIDEMENT Scrotal ABSCESS;  Surgeon: Ricarda Frame, MD;  Location: [facility] ORS;  Service: General;  Laterality: Left;   POLYPECTOMY  07/01/2023   Procedure: POLYPECTOMY;  Surgeon: Wyline Mood, MD;  Location: [facility] ENDOSCOPY;  Service: Gastroenterology;;     Inpatient Medications: Scheduled Meds:  aspirin  324 mg Oral Once   [START ON 11/06/2023] aspirin  81 mg Oral Daily   Chlorhexidine Gluconate Cloth  6 each Topical Daily   [START ON 11/06/2023] clopidogrel  75 mg Oral Q breakfast   [START ON 11/06/2023] rosuvastatin  20 mg Oral Daily   sodium chloride flush  3 mL Intravenous Q12H   Continuous Infusions:  sodium chloride     sodium chloride     PRN Meds: sodium chloride, acetaminophen, hydrALAZINE, labetalol, ondansetron (ZOFRAN) IV, sodium chloride flush  Allergies:    Allergies  Allergen Reactions   Enalapril Other (See Comments)    Unknown reaction   Vicodin [Hydrocodone-Acetaminophen] Hives and Rash    Severe headaches (also) NAME BRAND ONLY PER PT CAN TAKE GENERIC    Social History:   Social History   Tobacco Use   Smoking status: Never   Smokeless tobacco: Never  Vaping Use   Vaping status: Never Used  Substance Use Topics   Alcohol use: No   Drug use: No    Comment: PT DENIES    Family History:   Family History  Problem Relation Age of Onset   Hypertension Father    Heart disease Father    Clotting disorder Mother    Kidney disease Brother    Heart attack Maternal Grandmother    Heart attack Maternal Grandfather    Heart attack Paternal Grandfather      ROS:  Review of Systems  Unable to perform ROS: Acuity of condition    Physical Exam/Data:   Vitals:   11/05/23 1225 11/05/23 1230 11/05/23 1257 11/05/23 1301  BP: 97/64 111/77 116/77  129/73  Pulse: 66 (!) 58 66 70  Resp: 15 19 14 17   Temp:    97.7 F (36.5 C)  TempSrc:    Oral  SpO2: 98% 95% 96% 95%  Weight:    102.8 kg  Height:   6\' 2"  (1.88 m) 6\' 2"  (1.88 m)   No intake or output data in the 24 hours ending 11/05/23 1330    11/05/2023    1:01 PM 11/05/2023   10:16 AM 10/28/2023    1:32 PM  Last 3 Weights  Weight (lbs) 226 lb 10.1 oz 237 lb 14 oz 230 lb 8 oz  Weight (kg) 102.8 kg 107.9 kg 104.554 kg     Body mass index is 29.1 kg/m.  General: Uncomfortable appearing man, lying on EMS stretcher. HEENT: Blood noted on posterior scalp without  active bleeding.  Superficial abrasions noted. Neck: Unable to assess JVP due to cervical spine collar. Vascular: 2+ radial pulses bilaterally. Cardiac: Regular rate and rhythm without murmurs, rubs, or gallops. Lungs:  clear to auscultation bilaterally, no wheezing, rhonchi or rales  Abd: soft, nontender, no hepatomegaly  Ext: no edema Musculoskeletal: Mid and lower back pain to palpation. Skin: warm and dry  Neuro:  CNs 2-12 intact, no focal abnormalities noted Psych:  Normal affect   EKG:  The EKG performed by EMS and 9:48 AM on 11/05/2023 was personally reviewed and demonstrates: Normal sinus rhythm with 1 mm of inferior ST segment elevation.  Relevant CV Studies: See cath report below.  Laboratory Data:  High Sensitivity Troponin:   Recent Labs  Lab 11/05/23 1015  TROPONINIHS 8     Chemistry Recent Labs  Lab 11/05/23 1015  NA 140  K 3.9  CL 105  CO2 25  GLUCOSE 119*  BUN 24*  CREATININE 1.64*  CALCIUM 9.2  GFRNONAA 47*  ANIONGAP 10    Recent Labs  Lab 11/05/23 1015  PROT 7.9  ALBUMIN 4.4  AST 21  ALT 19  ALKPHOS 73  BILITOT 1.2*   Lipids  Recent Labs  Lab 11/05/23 1012  CHOL 199  TRIG 105  HDL 59  LDLCALC 119*  CHOLHDL 3.4    Hematology Recent Labs  Lab 11/05/23 1015  WBC 8.0  RBC 5.68  HGB 16.8  HCT 52.9*  MCV 93.1  MCH 29.6  MCHC 31.8  RDW 15.5  PLT 149*    Thyroid No results for input(s): "TSH", "FREET4" in the last 168 hours.  BNPNo results for input(s): "BNP", "PROBNP" in the last 168 hours.  DDimer No results for input(s): "DDIMER" in the last 168 hours.   Radiology/Studies:  CARDIAC CATHETERIZATION  Result Date: 11/05/2023 Conclusions: Severe multivessel coronary artery disease, as detailed above.  Culprit lesion for inferior STEMI appears to be hazy 80-90% distal LCx stenosis.  There is also occlusion of the proximal RCA with bridging and left-to-right collaterals that could not be crossed with a wire suggesting chronic nature.  Moderate to severe but noncritical proximal/mid LAD and proximal ramus disease also present. Normal left ventricular filling pressure (LVEDP 14 mmHg). Successful IVUS-guided PCI to hazy 80-90% distal LCx stenosis using Onyx Frontier 3.0 x 30 mm drug-eluting stent with 0% residual stenosis and TIMI-3 flow.  First medical contact to balloon time was delayed because of the patient's syncope and head trauma necessitating CT head and cervical spine in the ER prior to transport to the cath lab as well as multiple potential culprit lesions. Recommendations: Admit to ICU for post STEMI monitoring. Begin heparin infusion when next dose of apixaban would be due (approximately 8 PM).  Transition to apixaban as soon as tomorrow if there is no evidence of bleeding or vascular complication. Anticipate triple therapy with aspirin, clopidogrel, and apixaban for 1 month after which time aspirin can be discontinued and clopidogrel and apixaban continued for 12 months. Aggressive secondary prevention of coronary disease, including escalation of rosuvastatin. Consider functional study when patient has recovered from this hospitalization to assess hemodynamic significance of LAD and ramus intermedius disease. Gentle postcatheterization hydration in the setting of chronic kidney disease. Yvonne Kendall, MD [facility]  CT Head Wo  Contrast  Result Date: 11/05/2023 CLINICAL DATA:  Head trauma, coagulopathy (Age 62-64y); Neck trauma, midline tenderness (Age 27-64y) EXAM: CT HEAD WITHOUT CONTRAST CT CERVICAL SPINE WITHOUT CONTRAST TECHNIQUE: Multidetector CT imaging of the head  and cervical spine was performed following the standard protocol without intravenous contrast. Multiplanar CT image reconstructions of the cervical spine were also generated. RADIATION DOSE REDUCTION: This exam was performed according to the departmental dose-optimization program which includes automated exposure control, adjustment of the mA and/or kV according to patient size and/or use of iterative reconstruction technique. COMPARISON:  None Available. FINDINGS: CT HEAD FINDINGS Brain: No evidence of acute infarction, hemorrhage, hydrocephalus, extra-axial collection or mass lesion/mass effect. Vascular: No hyperdense vessel. Skull: No acute fracture. Sinuses/Orbits: Clear sinuses.  No acute orbital findings. Other: No mastoid effusions. Heterogeneous appearance of the parotid glands with many small calcifications. CT CERVICAL SPINE FINDINGS Alignment: No substantial sagittal subluxation. Skull base and vertebrae: Mild height loss of upper thoracic vertebral bodies appears similar to CT of the chest from Apr 27, 2017. No evidence of acute fracture. Soft tissues and spinal canal: No prevertebral fluid or swelling. No visible canal hematoma. Disc levels:  Moderate multilevel degenerative change. Upper chest: Visualized lung apices are clear. IMPRESSION: 1. No evidence of acute abnormality intracranially or in the cervical spine. 2. Heterogeneous appearance of the parotid glands with many small calcifications, which suggests chronic inflammation (such as with Sjogren's). Electronically Signed   By: Feliberto Harts M.D.   On: 11/05/2023 10:43   CT Cervical Spine Wo Contrast  Result Date: 11/05/2023 CLINICAL DATA:  Head trauma, coagulopathy (Age 42-64y); Neck  trauma, midline tenderness (Age 54-64y) EXAM: CT HEAD WITHOUT CONTRAST CT CERVICAL SPINE WITHOUT CONTRAST TECHNIQUE: Multidetector CT imaging of the head and cervical spine was performed following the standard protocol without intravenous contrast. Multiplanar CT image reconstructions of the cervical spine were also generated. RADIATION DOSE REDUCTION: This exam was performed according to the departmental dose-optimization program which includes automated exposure control, adjustment of the mA and/or kV according to patient size and/or use of iterative reconstruction technique. COMPARISON:  None Available. FINDINGS: CT HEAD FINDINGS Brain: No evidence of acute infarction, hemorrhage, hydrocephalus, extra-axial collection or mass lesion/mass effect. Vascular: No hyperdense vessel. Skull: No acute fracture. Sinuses/Orbits: Clear sinuses.  No acute orbital findings. Other: No mastoid effusions. Heterogeneous appearance of the parotid glands with many small calcifications. CT CERVICAL SPINE FINDINGS Alignment: No substantial sagittal subluxation. Skull base and vertebrae: Mild height loss of upper thoracic vertebral bodies appears similar to CT of the chest from Apr 27, 2017. No evidence of acute fracture. Soft tissues and spinal canal: No prevertebral fluid or swelling. No visible canal hematoma. Disc levels:  Moderate multilevel degenerative change. Upper chest: Visualized lung apices are clear. IMPRESSION: 1. No evidence of acute abnormality intracranially or in the cervical spine. 2. Heterogeneous appearance of the parotid glands with many small calcifications, which suggests chronic inflammation (such as with Sjogren's). Electronically Signed   By: Feliberto Harts M.D.   On: 11/05/2023 10:43     Assessment and Plan:   Inferior STEMI and syncope: Jonathon Snow has not had any chest pain but has syncopal episode at home leading to head trauma.  EKG by EMS showed new inferior ST segment elevation.  Given these  findings, Jonathon Snow was referred for emergent cardiac catheterization and possible PCI that demonstrated multivessel CAD.  He underwent PCI to distal LCx and remains stable at this time. -Admit to ICU for post STEMI monitoring/care.  Appreciate hospitalist assistance with admission. -Increase rosuvastatin to 20 mg daily to target LDL less than 70. -Given history of intracerebral venous sinus thrombosis and VTE, recommend starting heparin infusion when next dose of apixaban  would be due.  If no evidence of bleeding or vascular injury, apixaban could be restarted as soon as tomorrow. -Anticipate triple therapy with aspirin, clopidogrel, and apixaban for 1 month, after which time aspirin can be discontinued and clopidogrel and apixaban continued for a total of 12 months of therapy. -Obtain echocardiogram. -Consider functional study when patient has recovered from his syncope/injury to determine hemodynamic significance and need for revascularization of LAD/ramus disease, particularly if the patient has any further symptoms.  Syncope: She also has had intermittent dizziness in the past, though this is his first frank syncopal episode.  Given multivessel CAD, ischemia leading to arrhythmias of concern. -Continue telemetry monitoring. -Obtain echocardiogram. -Will likely need event monitor at discharge.  VTE and history of intracranial venous sinus thrombosis: Patient compliant with apixaban. -Transition to heparin this evening.  If no evidence of bleeding or vascular injury, he can be switched back to apixaban as soon as tomorrow.  Lupus: Chart indicates patient has been on prednisone and CellCept. -Ongoing management per internal medicine.  Chronic kidney disease stage III: Creatinine near baseline today.  LVEDP upper normal. -Gentle postcatheterization hydration. -Avoid nephrotoxic agents, including additional IV contrast if at all possible.  Hyperlipidemia: LDL suboptimally controlled on  labs today. -Increase rosuvastatin to 20 mg daily.  Acute on chronic back pain: No evidence of acute intracranial or cervical spine injury on CTs.  Patient has history of chronic back pain though he reports that it is worse after his fall today. -Defer further workup to primary team.   Risk Assessment/Risk Scores:     TIMI Risk Score for ST  Elevation MI:   The patient's TIMI risk score is 4, which indicates a 7.3% risk of all cause mortality at 30 days.    For questions or updates, please contact [facility] Please consult [email or web address] for contact info under [facility] Cardiology.  Signed, Yvonne Kendall, MD  11/05/2023 1:30 PM

## 2023-11-05 NOTE — ED Notes (Signed)
Dr. Saunders Revel with cardiology at bedside.

## 2023-11-05 NOTE — ED Notes (Signed)
Pt taken to CT scan for head and c spine prior to cath accompanied by Dr. Okey Dupre, Dr. Larinda Buttery.

## 2023-11-05 NOTE — Progress Notes (Signed)
CT abdomen/pelvis results called in by Radiology: 1. Fat stranding of the visualized posterior distal mediastinum. Finding likely related to thoracic spine fracture below. Recommend CT chest with intravenous contrast for further evaluation. Differential diagnosis includes inflammation, aortic injury, esophageal injury. 2. Distraction injury of the thoracic spine. Recommend thoracolumbar MRI for further evaluation of possible underlying ligamentous injury. Acute transverse fracture through the T10 vertebral body anterior wall and contiguous osteophytes. Query similar finding along the T11 inferior endplate. Colonic diverticulosis with no acute diverticulitis. 3. Left lower anterior abdominal wall subcutaneus soft tissue edema and hematoma formation. Associated subcutaneus soft tissue emphysema. No retained radiopaque foreign body. 4. Prostatomegaly. 5.  Aortic Atherosclerosis   Of note patient retaining urine, requiring I&O catheterization. Case discussed with [facility] NP, in agreement with plan below: - MRI thoracic spine - CT angio chest   Cheryll Cockayne Rust-Chester, AGACNP-BC Acute Care Nurse Practitioner [facility]   [phone] / [phone] Please see Amion for details.

## 2023-11-05 NOTE — ED Notes (Signed)
C-collar applied by Dr. Larinda Buttery

## 2023-11-05 NOTE — Progress Notes (Signed)
[facility] Interventional Cardiology Note  I was alerted around 1720 that the patient developed chest pain and nausea, for which she was prescribed hydromorphone by the hospitalist team.  Stat EKG showed persistent mild inferolateral ST segment elevation, unchanged from prior tracings today.  Right forearm hematoma stable to slightly larger with TR band still in place.  Hematoma soft without evidence of active expansion on examination.  During my evaluation of Jonathon Snow, he suddenly became bradycardic and hypotensive with heart rates into the 40s and systolic pressures in the 60s.  He was diaphoretic and somewhat somnolent.  He was given a normal saline fluid bolus without significant improvement.  He was started on norepinephrine 2 mcg/min with normalization of heart rate and blood pressure.  Nausea also resolved after he vomited.  He began to complain of left sided abdominal pain that was new.  Examination revealed bruising along the abdominal wall in the left lower quadrant towards the left flank that was not present on evaluation when the patient first presented today.  The patient denies hitting his left side or abdomen during his fall today.  He has also had continued oozing from his scalp laceration.  I personally performed a limited bedside echocardiogram that was technically challenging.  LVEF appears grossly normal.  RV grossly normal in size but not well-visualized.  No significant pericardial effusion seen.  Repeat labs this afternoon are notable for high-sensitivity troponin I of 10 and hemoglobin of 14.0 (down from 16.8 on initial presentation).  In summary, Jonathon Snow is a 62 year old man with sudden lightheadedness and syncope earlier today, admitted with new inferior ST segment elevation prompting emergent cardiac catheterization and PCI to distal LCx.  He was also noted to have CTO of relatively small RCA and moderate to severe but not critical ramus intermedius and LAD disease.   Etiology for his syncope earlier today remains unclear.  Initial thought was acute plaque rupture in the distal LCx, though his troponin has remained relatively flat and within the normal range, arguing against this.  He had recurrent lightheadedness with hypotension and bradycardia this evening suspicious for a vasovagal episode.  This has been complicated by interval development of left lower abdominal wall hematoma.  He has also not voided significantly since coming to the floor.  Bladder scan has been requested to ensure that urinary retention and bladder distention are not playing a role.  I have consulted the critical care team to assist with further workup of his intermittent hypotension.  He has been weaned off norepinephrine at this time.  I recommend considering CT of the abdomen and pelvis as well as serial CBCs every 6 hours.  I will hold initiation of heparin pending further workup of possible intraabdominal injury as well as cessation of scalp bleeding.  Dr. Chipper Herb has already consulted general surgery to assist with evaluation and management of scalp laceration.  Based on CT of the abdomen pelvis, their assistance would also be appreciated with any traumatic injury in the abdomen and pelvis that is identified.  Continue to hold home hypertensive medications in the setting of transient hypotension.  Jonathon Kendall, MD [facility] 11/05/23 6:50 PM

## 2023-11-05 NOTE — Progress Notes (Signed)
[facility] Interventional Cardiology Note  Patient assessed following catheterization due to concerns for forearm hematoma.  Moderate sized hematoma noted above TR band upon my arrival with nursing applying pressure to the site.  It appeared the TR band may have shifted after transport to the ICU.  The TR band was repositioned more proximally with hemostasis achieved.  Some oozing noted after subsequent deflation of TR band, leading me to add back 3 additional mL of air.  TR band should be slowly deflated (initially 1 mL after 30 minutes, followed by additional 3 mL every 15 minutes until TR band has been deflated).  Continue to monitor forearm in the ICU and keep forearm elevated when possible.  As long as there is no evidence of continued bleeding or enlargement of the hematoma, heparin can be initiated this evening around 8 PM without a bolus, given the patient's high thrombotic risk.  Yvonne Kendall, MD [facility] 11/05/23 4:41 PM

## 2023-11-05 NOTE — ED Triage Notes (Signed)
Pt arrives via [facility] from home with CODE STEMI. Pt was reportedly cooking outside and felt dizzy and had syncopal episode. Pt had stemi called in the field in inferior leads. Pt has laceration to the posterior or his head. Anticoagulated on Eliquis. Initially hypotensive 83/38.   EMS last VS 106/78

## 2023-11-05 NOTE — ED Provider Notes (Signed)
[facility] Provider Note    Event Date/Time   First MD Initiated Contact with Patient 11/05/23 1017     (approximate)   History   Chief Complaint Code STEMI   HPI  Jonathon Snow is a 62 y.o. male with past medical history of hypertension, lupus, CKD, and PE on Eliquis who presents to the ED complaining of syncope.  Patient reports that he was cooking something at home when he began to feel lightheaded and dizzy, eventually passed out and fell backwards, striking his head.  He denies any chest pain with this episode or afterwards, but does report that he is feeling more short of breath than usual.  He additionally complains of pain in the middle of his back following the fall, deals with chronic pain in his back but states this is worse than usual.  Per EMS, patient with laceration to posterior scalp but bleeding controlled.  Prehospital EKG was concerning for STEMI with inferior ST elevation, code STEMI activated prior to arrival.     Physical Exam   Triage Vital Signs: ED Triage Vitals  Encounter Vitals Group     BP 11/05/23 1015 91/68     Systolic BP Percentile --      Diastolic BP Percentile --      Pulse Rate 11/05/23 1015 67     Resp 11/05/23 1015 17     Temp --      Temp src --      SpO2 11/05/23 1015 97 %     Weight 11/05/23 1016 237 lb 14 oz (107.9 kg)     Height --      Head Circumference --      Peak Flow --      Pain Score --      Pain Loc --      Pain Education --      Exclude from Growth Chart --     Most recent vital signs: Vitals:   11/05/23 1015 11/05/23 1034  BP: 91/68   Pulse: 67   Resp: 17   SpO2: 97% 99%    Constitutional: Alert and oriented. Eyes: Conjunctivae are normal. Head: Superficial laceration to posterior scalp with no active bleeding. Nose: No congestion/rhinnorhea. Mouth/Throat: Mucous membranes are moist.  Neck: Midline cervical spine tenderness to palpation. Cardiovascular: Normal rate, regular  rhythm. Grossly normal heart sounds.  2+ radial pulses bilaterally. Respiratory: Normal respiratory effort.  No retractions. Lungs CTAB.  No chest wall tenderness to palpation. Gastrointestinal: Soft and nontender. No distention. Musculoskeletal: No lower extremity tenderness nor edema.  No upper extremity bony tenderness to palpation.  Midline lower thoracic and upper lumbar spinal tenderness to palpation. Neurologic:  Normal speech and language. No gross focal neurologic deficits are appreciated.    ED Results / Procedures / Treatments   Labs (all labs ordered are listed, but only abnormal results are displayed) Labs Reviewed  CBC WITH DIFFERENTIAL/PLATELET  PROTIME-INR  APTT  COMPREHENSIVE METABOLIC PANEL  LIPID PANEL  I-STAT CG4 LACTIC ACID, ED  TROPONIN I (HIGH SENSITIVITY)   RADIOLOGY CT head reviewed and interpreted by me with no hemorrhage or midline shift.  PROCEDURES:  Critical Care performed: Yes, see critical care procedure note(s)  .Critical Care  Performed by: Chesley Noon, MD Authorized by: Chesley Noon, MD   Critical care provider statement:    Critical care time (minutes):  30   Critical care time was exclusive of:  Separately billable procedures and treating other patients and  teaching time   Critical care was necessary to treat or prevent imminent or life-threatening deterioration of the following conditions:  Cardiac failure and trauma   Critical care was time spent personally by me on the following activities:  Development of treatment plan with patient or surrogate, discussions with consultants, evaluation of patient's response to treatment, examination of patient, ordering and review of laboratory studies, ordering and review of radiographic studies, ordering and performing treatments and interventions, pulse oximetry, re-evaluation of patient's condition and review of old charts   I assumed direction of critical care for this patient from another  provider in my specialty: no     Care discussed with: admitting provider      MEDICATIONS ORDERED IN ED: Medications  0.9 %  sodium chloride infusion (has no administration in time range)  aspirin chewable tablet 324 mg ( Oral MAR Hold 11/05/23 1026)     IMPRESSION / MDM / ASSESSMENT AND PLAN / ED COURSE  I reviewed the triage vital signs and the nursing notes.                              62 y.o. male with past medical history of hypertension, lupus, CKD, and PE on Eliquis who presents to the ED following syncopal episode at home where he struck his head, now complains of shortness of breath with headache, neck pain, and back pain.  Patient's presentation is most consistent with acute presentation with potential threat to life or bodily function.  Differential diagnosis includes, but is not limited to, ACS, PE, intracranial injury, cervical spine injury, back injury, dissection, pneumonia, pneumothorax.  Patient uncomfortable but nontoxic-appearing and in no acute distress, vital signs remarkable for borderline hypotension but otherwise reassuring.  Prehospital EKG was reviewed and shows ST elevation inferiorly as well as in V5 and V6, concerning for STEMI.  While he has no chest pain, shortness of breath could be due to ACS.  Low suspicion for dissection as pain in the middle of his back is reproducible with palpation, suspect this is due to his fall.  Dr. Okey Dupre of cardiology is at bedside and would like to take patient to the Cath Lab.  Patient was placed in a cervical collar and CT head and cervical spine were done prior to patient being taken to the Cath Lab.  No obvious hemorrhage noted on CT head by my read, Dr. Okey Dupre to discuss with radiology and take patient to the Cath Lab at this time.      FINAL CLINICAL IMPRESSION(S) / ED DIAGNOSES   Final diagnoses:  ST elevation myocardial infarction (STEMI), unspecified artery (HCC)  Syncope and collapse  Injury of head, initial encounter      Rx / DC Orders   ED Discharge Orders     None        Note:  This document was prepared using Dragon voice recognition software and may include unintentional dictation errors.   Chesley Noon, MD 11/05/23 [phone]

## 2023-11-05 NOTE — Progress Notes (Signed)
Patient complains about new onset of chest pain, centrally located. Stat EKG showed no new ST-T changes. CBC showed Hb=14.0 compared to 16.8 earlier this morning.  Patient also has a scalp laceration started to bleed this afternoon. Contacted Medical sales representative for stapling.

## 2023-11-05 NOTE — Consult Note (Signed)
Patient ID: Jonathon Snow, male   DOB: July 22, 1961, 62 y.o.   MRN: [id number] CC: Scalp Laceration  History of Present Illness Jonathon Snow is a 62 y.o. 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  He says that he passed out and then fell on the floor hitting the back of his head.  He is unsure about the duration of his unconsciousness but was able to call the ambulance.  When he arrived to the emergency department he was found to have a STEMI and was taken to the Cath Lab for stenting.  He was also noted to have bleeding from the back of his head for which the surgical team is consulted.  When I saw him on exam he was in the ICU after returning from a CT abdomen pelvis given abdominal pain and nausea.  He reports headache.  There is no active bleeding at the time of my exam.  He also reports abdominal pain in LLQ and nausea.   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   Overview:  superior sagittal sinus, left transverse sinus and cortical veins    COVID-19 virus infection 12/2020   Depression    DVT (deep venous thrombosis) (HCC)    Dyspnea    GERD (gastroesophageal reflux disease)    Heel spur, left 02/16/2019   Heel spur, right 02/16/2019   Herpes zoster infection of lumbar region 02/20/2020   History of kidney stones    Hyperlipidemia    Hypertension    Lupus    Lymphedema 10/07/2018   Morbid obesity (HCC)    Opiate abuse, episodic (HCC) 02/26/2018   Osteoporosis    Pneumonia    PONV (postoperative nausea and vomiting)    Postphlebitic syndrome with ulcer, 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   Presence of IVC filter 03/22/2020   Removed   Pulmonary embolism (HCC)    Renal disorder    Stage III       Past Surgical History:  Procedure Laterality Date   ANKLE SURGERY Right    BACK SURGERY      BRONCHIAL WASHINGS N/A 11/01/2021   Procedure: BRONCHIAL WASHINGS;  Surgeon: Vida Rigger, MD;  Location: [facility] ORS;  Service: Thoracic;  Laterality: N/A;   COLONOSCOPY WITH PROPOFOL N/A 05/28/2020   Procedure: COLONOSCOPY WITH PROPOFOL;  Surgeon: Wyline Mood, MD;  Location: [facility] ENDOSCOPY;  Service: Endoscopy;  Laterality: N/A;   COLONOSCOPY WITH PROPOFOL N/A 07/01/2023   Procedure: COLONOSCOPY WITH PROPOFOL;  Surgeon: Wyline Mood, MD;  Location: [facility] ENDOSCOPY;  Service: Gastroenterology;  Laterality: N/A;   CYST EXCISION  92 or 93    Liver cyst removal [facility]   FLEXIBLE BRONCHOSCOPY N/A 11/01/2021   Procedure: FLEXIBLE BRONCHOSCOPY;  Surgeon: Vida Rigger, MD;  Location: [facility] ORS;  Service: Thoracic;  Laterality: N/A;   HIP PINNING,CANNULATED Left 02/19/2023   Procedure: PERCUTANEOUS FIXATION OF FEMORAL NECK;  Surgeon: Signa Kell, MD;  Location: [facility] ORS;  Service: Orthopedics;  Laterality: Left;   I & D EXTREMITY Right 04/29/2017   Procedure: IRRIGATION AND DEBRIDEMENT EXTREMITY;  Surgeon: Ricarda Frame, MD;  Location: [facility] ORS;  Service: General;  Laterality: Right;   IRRIGATION AND DEBRIDEMENT ABSCESS Left 04/29/2017   Procedure: IRRIGATION AND DEBRIDEMENT Scrotal ABSCESS;  Surgeon: Ricarda Frame, MD;  Location: [facility] ORS;  Service: General;  Laterality: Left;  POLYPECTOMY  07/01/2023   Procedure: POLYPECTOMY;  Surgeon: Wyline Mood, MD;  Location: [facility] ENDOSCOPY;  Service: Gastroenterology;;    Allergies  Allergen Reactions   Enalapril Other (See Comments)    Unknown reaction   Vicodin [Hydrocodone-Acetaminophen] Hives and Rash    Severe headaches (also) NAME BRAND ONLY PER PT CAN TAKE GENERIC    Current Facility-Administered Medications  Medication Dose Route Frequency Provider Last Rate Last Admin   0.9 %  sodium chloride infusion  250 mL Intravenous PRN End, Cristal Deer, MD       acetaminophen (TYLENOL) tablet 650 mg  650 mg Oral Q4H PRN End, Cristal Deer, MD   650 mg at  11/05/23 1340   albuterol (PROVENTIL) (2.5 MG/3ML) 0.083% nebulizer solution 3 mL  3 mL Inhalation Q6H PRN Emeline General, MD       [START ON 11/06/2023] ARIPiprazole (ABILIFY) tablet 2 mg  2 mg Oral Daily Mikey College T, MD       aspirin chewable tablet 324 mg  324 mg Oral Once Chesley Noon, MD       Melene Muller ON 11/06/2023] aspirin chewable tablet 81 mg  81 mg Oral Daily End, Cristal Deer, MD       Chlorhexidine Gluconate Cloth 2 % PADS 6 each  6 each Topical Daily End, Christopher, MD   6 each at 11/05/23 1339   [START ON 11/06/2023] clopidogrel (PLAVIX) tablet 75 mg  75 mg Oral Q breakfast End, Cristal Deer, MD       gabapentin (NEURONTIN) capsule 300 mg  300 mg Oral TID Mikey College T, MD   300 mg at 11/05/23 1530   hydrOXYzine (ATARAX) tablet 25 mg  25 mg Oral BID Mikey College T, MD       loratadine (CLARITIN) tablet 5 mg  5 mg Oral QPM Mikey College T, MD   5 mg at 11/05/23 1714   LORazepam (ATIVAN) tablet 0.5 mg  0.5 mg Oral BID PRN Emeline General, MD       montelukast (SINGULAIR) tablet 10 mg  10 mg Oral QHS Mikey College T, MD       mycophenolate (CELLCEPT) capsule 1,000 mg  1,000 mg Oral BID Mikey College T, MD       norepinephrine (LEVOPHED) 4mg  in (0.016 mg/mL) premix infusion  2-10 mcg/min Intravenous Titrated End, Christopher, MD   Stopped at 11/05/23 1815   ondansetron (ZOFRAN) injection 4 mg  4 mg Intravenous Q6H PRN End, Cristal Deer, MD   4 mg at 11/05/23 1658   Oral care mouth rinse  15 mL Mouth Rinse PRN Marrion Coy, MD       Melene Muller ON 11/06/2023] predniSONE (DELTASONE) tablet 10 mg  10 mg Oral Daily Mikey College T, MD       promethazine (PHENERGAN) 12.5 mg in sodium chloride 0.9 % 50 mL IVPB  12.5 mg Intravenous Once End, Cristal Deer, MD       Melene Muller ON 11/06/2023] rosuvastatin (CRESTOR) tablet 20 mg  20 mg Oral Daily End, Cristal Deer, MD       Melene Muller ON 11/06/2023] sertraline (ZOLOFT) tablet 100 mg  100 mg Oral Daily Mikey College T, MD       sodium chloride flush (NS) 0.9 %  injection 3 mL  3 mL Intravenous Q12H End, Christopher, MD   3 mL at 11/05/23 1713   sodium chloride flush (NS) 0.9 % injection 3 mL  3 mL Intravenous PRN End, Cristal Deer, MD       traMADol Janean Sark) tablet 50 mg  50 mg Oral Q6H PRN Emeline General, MD        Family History Family History  Problem Relation Age of Onset   Hypertension Father    Heart disease Father    Clotting disorder Mother    Kidney disease Brother    Heart attack Maternal Grandmother    Heart attack Maternal Grandfather    Heart attack Paternal Grandfather       Social History Social History   Tobacco Use   Smoking status: Never   Smokeless tobacco: Never  Vaping Use   Vaping status: Never Used  Substance Use Topics   Alcohol use: No   Drug use: No    Comment: PT DENIES      ROS Full ROS of systems performed and is otherwise negative there than what is stated in the HPI  Physical Exam Blood pressure 137/75, pulse 65, temperature 97.7 F (36.5 C), temperature source Oral, resp. rate 20, height 6\' 2"  (1.88 m), weight 102.8 kg, SpO2 100%. Patient examined in the ICU and appears ill.  He has a emesis bag with him.  On the back of his head there is a 1 cm laceration with the wound edges that are well-approximated.  It is hemostatic.  It is located on the central occiput.  His abdomen is soft and nontender distended.  He has bruising along the lower abdomen with pain over the bruising to palpation.  Data Reviewed I independently reviewed his CT scans.  There is no intracranial bleeding noted.  On CT abdomen pelvis he has evidence of a previous liver cyst.  I do not see evidence of splenic or hepatic laceration although this is a noncontrasted scan.  He has some soft tissue stranding over that his lower abdomen that is consistent with physical exam  I have personally reviewed the patient's imaging and medical records.    Assessment    Mr. Lawernce Ion is a 62 year old male that had a syncopal episode today and was  found to be he having an MI.  He also had a laceration to the back of his head that is currently hemostatic.  He has bruising to his abdominal wall and a CT scan that shows extraperitoneal soft tissue stranding  Plan  Given that the wound edges of his small scalp laceration are well-approximated I think it is okay at this time to hold off on stapling the wound given that it is hemostatic.  I did ask the ICU team to have a staple at bedside in case there is ongoing bleeding.  I also discussed with them that if he does have ongoing bleeding you can wrap his head and Coban to control the bleeding.  As far as his abdomen again I do not see any active bleeding or obvious traumatic injury although this is a noncontrasted scan.  Recommend trending H&H's to ensure that there is no concern for ongoing bleeding.  Given his nausea I would keep him n.p.o. tonight and if this abates he can have a diet from our perspective.  I have discussed with the cardiologist and we will hold any heparin drip for tonight.    Kandis Cocking 11/05/2023, 7:41 PM

## 2023-11-05 NOTE — H&P (Signed)
History and Physical    Jonathon Snow DGU:[id number] DOB: 1960-12-24 DOA: 11/05/2023  PCP: Danelle Berry, PA-C (Confirm with patient/family/NH records and if not entered, this has to be entered at [facility] point of entry) Patient coming from: Home  I have personally briefly reviewed patient's old medical records in [facility]  Chief Complaint: I passed out  HPI: Jonathon Snow is a 62 y.o. 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  Patient suddenly felt lightheadedness while cooking breakfast alone at home and then passed out and collapsed on the floor and hit the backside of his head.  Recovered consciousness by his own and seeing breakfast still cooking on the stove top.  Unsure about duration of unconsciousness.  Able to call EMS himself.  Denied that loss control urine bowel movements no tongue biting.  Denied any chest pain shortness of breath, no numbness weakness of any of the limbs.  ED Course: Blood pressure borderline low, 91/68, bradycardia borderline, EKG showed ST elevation in lead to 3 and aVF and code STEMI was called and patient shifted to Cath Lab.  LHC showed chronic near occlusion of RCA unable to stent but LCx 80-90% stenosis which was stented.  Noncritical stenosis of LAD and ramus, not stented.  CT head and neck negative for fracture or dislocation.  Review of Systems: As per HPI otherwise 14 point review of systems negative.    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   Overview:  superior sagittal sinus, left transverse sinus and cortical veins    COVID-19 virus infection 12/2020   Depression    DVT (deep venous thrombosis) (HCC)    Dyspnea    GERD (gastroesophageal reflux disease)    Heel spur, left 02/16/2019   Heel spur, right  02/16/2019   Herpes zoster infection of lumbar region 02/20/2020   History of kidney stones    Hyperlipidemia    Hypertension    Lupus    Lymphedema 10/07/2018   Morbid obesity (HCC)    Opiate abuse, episodic (HCC) 02/26/2018   Osteoporosis    Pneumonia    PONV (postoperative nausea and vomiting)    Postphlebitic syndrome with ulcer, 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   Presence of IVC filter 03/22/2020   Removed   Pulmonary embolism (HCC)    Renal disorder    Stage III    Past Surgical History:  Procedure Laterality Date   ANKLE SURGERY Right    BACK SURGERY     BRONCHIAL WASHINGS N/A 11/01/2021   Procedure: BRONCHIAL WASHINGS;  Surgeon: Vida Rigger, MD;  Location: [facility] ORS;  Service: Thoracic;  Laterality: N/A;   COLONOSCOPY WITH PROPOFOL N/A 05/28/2020   Procedure: COLONOSCOPY WITH PROPOFOL;  Surgeon: Wyline Mood, MD;  Location: [facility] ENDOSCOPY;  Service: Endoscopy;  Laterality: N/A;   COLONOSCOPY WITH PROPOFOL N/A 07/01/2023   Procedure: COLONOSCOPY WITH PROPOFOL;  Surgeon: Wyline Mood, MD;  Location: [facility] ENDOSCOPY;  Service: Gastroenterology;  Laterality: N/A;   CYST EXCISION  92 or 93    Liver cyst removal [facility]   FLEXIBLE BRONCHOSCOPY N/A 11/01/2021   Procedure: FLEXIBLE BRONCHOSCOPY;  Surgeon: Vida Rigger, MD;  Location: [facility] ORS;  Service: Thoracic;  Laterality: N/A;   HIP PINNING,CANNULATED Left 02/19/2023   Procedure: PERCUTANEOUS FIXATION OF FEMORAL NECK;  Surgeon: Signa Kell, MD;  Location: [facility] ORS;  Service: Orthopedics;  Laterality: Left;   I & D EXTREMITY Right 04/29/2017   Procedure: IRRIGATION AND DEBRIDEMENT EXTREMITY;  Surgeon: Ricarda Frame, MD;  Location: [facility] ORS;  Service: General;  Laterality: Right;   IRRIGATION AND DEBRIDEMENT ABSCESS Left 04/29/2017   Procedure: IRRIGATION AND DEBRIDEMENT Scrotal ABSCESS;  Surgeon: Ricarda Frame, MD;  Location: [facility] ORS;  Service: General;  Laterality: Left;   POLYPECTOMY  07/01/2023   Procedure: POLYPECTOMY;   Surgeon: Wyline Mood, MD;  Location: [facility] ENDOSCOPY;  Service: Gastroenterology;;     reports that he has never smoked. He has never used smokeless tobacco. He reports that he does not drink alcohol and does not use drugs.  Allergies  Allergen Reactions   Enalapril Other (See Comments)    Unknown reaction   Vicodin [Hydrocodone-Acetaminophen] Hives and Rash    Severe headaches (also) NAME BRAND ONLY PER PT CAN TAKE GENERIC    Family History  Problem Relation Age of Onset   Hypertension Father    Heart disease Father    Clotting disorder Mother    Kidney disease Brother    Heart attack Maternal Grandmother    Heart attack Maternal Grandfather    Heart attack Paternal Grandfather      Prior to Admission medications   Medication Sig Start Date End Date Taking? Authorizing Provider  albuterol (VENTOLIN HFA) 108 (90 Base) MCG/ACT inhaler Inhale 2 puffs into the lungs every 6 (six) hours as needed for wheezing or shortness of breath. 10/29/22   Berniece Salines, FNP  amLODipine (NORVASC) 5 MG tablet Take 1 tablet (5 mg total) by mouth daily. 04/07/23   Danelle Berry, PA-C  apixaban (ELIQUIS) 5 MG TABS tablet Take 1 tablet (5 mg total) by mouth 2 (two) times daily. 09/29/23   Alinda Dooms, NP  ARIPiprazole (ABILIFY) 2 MG tablet  06/26/23   [provider]  cholecalciferol (VITAMIN D3) 25 MCG (1000 UNIT) tablet Take 1,000 Units by mouth daily.    [provider]  gabapentin (NEURONTIN) 300 MG capsule Take 1 capsule (300 mg total) by mouth 3 (three) times daily. 10/28/23   Berniece Salines, FNP  hydrOXYzine (ATARAX) 25 MG tablet Take 25 mg by mouth 2 (two) times daily. 08/13/23   [provider]  levocetirizine (XYZAL) 5 MG tablet Take 1 tablet (5 mg total) by mouth every evening. 12/08/22   Danelle Berry, PA-C  LORazepam (ATIVAN) 0.5 MG tablet Take 1 tablet (0.5 mg total) by mouth 2 (two) times daily as needed for anxiety. 04/24/23   Danelle Berry, PA-C  losartan (COZAAR)  100 MG tablet Take 1 tablet (100 mg total) by mouth daily. 04/07/23   Danelle Berry, PA-C  montelukast (SINGULAIR) 10 MG tablet Take 1 tablet (10 mg total) by mouth at bedtime. 12/08/22   Danelle Berry, PA-C  Multiple Vitamins-Minerals (MULTIVITAMIN WITH MINERALS) tablet Take 1 tablet by mouth daily.    [provider]  mycophenolate (CELLCEPT) 500 MG tablet Take 1,000 mg by mouth 2 (two) times daily.    [provider]  predniSONE (DELTASONE) 10 MG tablet Take 1 tablet by mouth daily. 11/28/22   [provider]  rosuvastatin (CRESTOR) 10 MG tablet Take 1 tablet (10 mg total) by mouth daily. 04/07/23   Danelle Berry, PA-C  sertraline (ZOLOFT) 100 MG tablet Take 1 tablet (100 mg total) by mouth daily. 07/07/23   Danelle Berry, PA-C  tadalafil (CIALIS) 20 MG tablet Take 1 tablet (20 mg total) by  mouth daily as needed for erectile dysfunction. 07/22/23   Vanna Scotland, MD  traMADol (ULTRAM) 50 MG tablet Take 1 tablet (50 mg total) by mouth every 6 (six) hours as needed for moderate pain. 02/20/23   Dedra Skeens, PA-C    Physical Exam: Vitals:   11/05/23 1230 11/05/23 1257 11/05/23 1301 11/05/23 1400  BP: 111/77 116/77 129/73 135/89  Pulse: (!) 58 66 70 69  Resp: 19 14 17 18   Temp:   97.7 F (36.5 C)   TempSrc:   Oral   SpO2: 95% 96% 95% 98%  Weight:   102.8 kg   Height:  6\' 2"  (1.88 m) 6\' 2"  (1.88 m)     Constitutional: NAD, calm, comfortable Vitals:   11/05/23 1230 11/05/23 1257 11/05/23 1301 11/05/23 1400  BP: 111/77 116/77 129/73 135/89  Pulse: (!) 58 66 70 69  Resp: 19 14 17 18   Temp:   97.7 F (36.5 C)   TempSrc:   Oral   SpO2: 95% 96% 95% 98%  Weight:   102.8 kg   Height:  6\' 2"  (1.88 m) 6\' 2"  (1.88 m)    Eyes: PERRL, lids and conjunctivae normal ENMT: Mucous membranes are moist. Posterior pharynx clear of any exudate or lesions.Normal dentition.  Neck: normal, supple, no masses, no thyromegaly Respiratory: clear to auscultation bilaterally, no wheezing,  no crackles. Normal respiratory effort. No accessory muscle use.  Cardiovascular: Regular rate and rhythm, no murmurs / rubs / gallops. No extremity edema. 2+ pedal pulses. No carotid bruits.  Abdomen: no tenderness, no masses palpated. No hepatosplenomegaly. Bowel sounds positive.  Musculoskeletal: no clubbing / cyanosis. No joint deformity upper and lower extremities. Good ROM, no contractures. Normal muscle tone.  Skin: no rashes, lesions, ulcers. No induration Neurologic: CN 2-12 grossly intact. Sensation intact, DTR normal. Strength 5/5 in all 4.  Psychiatric: Normal judgment and insight. Alert and oriented x 3. Normal mood.     Labs on Admission: I have personally reviewed following labs and imaging studies  CBC: Recent Labs  Lab 11/05/23 1015  WBC 8.0  NEUTROABS 6.8  HGB 16.8  HCT 52.9*  MCV 93.1  PLT 149*   Basic Metabolic Panel: Recent Labs  Lab 11/05/23 1015  NA 140  K 3.9  CL 105  CO2 25  GLUCOSE 119*  BUN 24*  CREATININE 1.64*  CALCIUM 9.2   GFR: Estimated Creatinine Clearance: 60.5 mL/min (A) (by C-G formula based on SCr of 1.64 mg/dL (H)). Liver Function Tests: Recent Labs  Lab 11/05/23 1015  AST 21  ALT 19  ALKPHOS 73  BILITOT 1.2*  PROT 7.9  ALBUMIN 4.4   No results for input(s): "LIPASE", "AMYLASE" in the last 168 hours. No results for input(s): "AMMONIA" in the last 168 hours. Coagulation Profile: Recent Labs  Lab 11/05/23 1015  INR 1.2   Cardiac Enzymes: No results for input(s): "CKTOTAL", "CKMB", "CKMBINDEX", "TROPONINI" in the last 168 hours. BNP (last 3 results) No results for input(s): "PROBNP" in the last 8760 hours. HbA1C: No results for input(s): "HGBA1C" in the last 72 hours. CBG: Recent Labs  Lab 11/05/23 1256  GLUCAP 93   Lipid Profile: Recent Labs    11/05/23 1012  CHOL 199  HDL 59  LDLCALC 119*  TRIG 105  CHOLHDL 3.4   Thyroid Function Tests: No results for input(s): "TSH", "T4TOTAL", "FREET4", "T3FREE",  "THYROIDAB" in the last 72 hours. Anemia Panel: No results for input(s): "VITAMINB12", "FOLATE", "FERRITIN", "TIBC", "IRON", "RETICCTPCT" in the last 72  hours. Urine analysis:    Component Value Date/Time   COLORURINE YELLOW 09/13/2021 1010   APPEARANCEUR Clear 03/25/2023 0833   LABSPEC 1.025 09/13/2021 1010   LABSPEC 1.013 04/10/2015 1036   PHURINE 5.5 09/13/2021 1010   GLUCOSEU Negative 03/25/2023 0833   GLUCOSEU Negative 04/10/2015 1036   HGBUR SMALL (A) 09/13/2021 1010   BILIRUBINUR Negative 03/25/2023 0833   BILIRUBINUR Negative 04/10/2015 1036   KETONESUR NEGATIVE 09/13/2021 1010   PROTEINUR 1+ (A) 03/25/2023 0833   PROTEINUR >300 (A) 09/13/2021 1010   UROBILINOGEN 0.2 07/23/2021 1423   UROBILINOGEN 0.2 03/17/2015 0025   NITRITE Negative 03/25/2023 0833   NITRITE NEGATIVE 09/13/2021 1010   LEUKOCYTESUR Negative 03/25/2023 0833   LEUKOCYTESUR NEGATIVE 09/13/2021 1010   LEUKOCYTESUR Negative 04/10/2015 1036    Radiological Exams on Admission: CARDIAC CATHETERIZATION  Result Date: 11/05/2023 Conclusions: Severe multivessel coronary artery disease, as detailed above.  Culprit lesion for inferior STEMI appears to be hazy 80-90% distal LCx stenosis.  There is also occlusion of the proximal RCA with bridging and left-to-right collaterals that could not be crossed with a wire suggesting chronic nature.  Moderate to severe but noncritical proximal/mid LAD and proximal ramus disease also present. Normal left ventricular filling pressure (LVEDP 14 mmHg). Successful IVUS-guided PCI to hazy 80-90% distal LCx stenosis using Onyx Frontier 3.0 x 30 mm drug-eluting stent with 0% residual stenosis and TIMI-3 flow.  First medical contact to balloon time was delayed because of the patient's syncope and head trauma necessitating CT head and cervical spine in the ER prior to transport to the cath lab as well as multiple potential culprit lesions. Recommendations: Admit to ICU for post STEMI  monitoring. Begin heparin infusion when next dose of apixaban would be due (approximately 8 PM).  Transition to apixaban as soon as tomorrow if there is no evidence of bleeding or vascular complication. Anticipate triple therapy with aspirin, clopidogrel, and apixaban for 1 month after which time aspirin can be discontinued and clopidogrel and apixaban continued for 12 months. Aggressive secondary prevention of coronary disease, including escalation of rosuvastatin. Consider functional study when patient has recovered from this hospitalization to assess hemodynamic significance of LAD and ramus intermedius disease. Gentle postcatheterization hydration in the setting of chronic kidney disease. Yvonne Kendall, MD [facility]  CT Head Wo Contrast  Result Date: 11/05/2023 CLINICAL DATA:  Head trauma, coagulopathy (Age 42-64y); Neck trauma, midline tenderness (Age 72-64y) EXAM: CT HEAD WITHOUT CONTRAST CT CERVICAL SPINE WITHOUT CONTRAST TECHNIQUE: Multidetector CT imaging of the head and cervical spine was performed following the standard protocol without intravenous contrast. Multiplanar CT image reconstructions of the cervical spine were also generated. RADIATION DOSE REDUCTION: This exam was performed according to the departmental dose-optimization program which includes automated exposure control, adjustment of the mA and/or kV according to patient size and/or use of iterative reconstruction technique. COMPARISON:  None Available. FINDINGS: CT HEAD FINDINGS Brain: No evidence of acute infarction, hemorrhage, hydrocephalus, extra-axial collection or mass lesion/mass effect. Vascular: No hyperdense vessel. Skull: No acute fracture. Sinuses/Orbits: Clear sinuses.  No acute orbital findings. Other: No mastoid effusions. Heterogeneous appearance of the parotid glands with many small calcifications. CT CERVICAL SPINE FINDINGS Alignment: No substantial sagittal subluxation. Skull base and vertebrae: Mild height  loss of upper thoracic vertebral bodies appears similar to CT of the chest from Apr 27, 2017. No evidence of acute fracture. Soft tissues and spinal canal: No prevertebral fluid or swelling. No visible canal hematoma. Disc levels:  Moderate multilevel degenerative change.  Upper chest: Visualized lung apices are clear. IMPRESSION: 1. No evidence of acute abnormality intracranially or in the cervical spine. 2. Heterogeneous appearance of the parotid glands with many small calcifications, which suggests chronic inflammation (such as with Sjogren's). Electronically Signed   By: Feliberto Harts M.D.   On: 11/05/2023 10:43   CT Cervical Spine Wo Contrast  Result Date: 11/05/2023 CLINICAL DATA:  Head trauma, coagulopathy (Age 76-64y); Neck trauma, midline tenderness (Age 22-64y) EXAM: CT HEAD WITHOUT CONTRAST CT CERVICAL SPINE WITHOUT CONTRAST TECHNIQUE: Multidetector CT imaging of the head and cervical spine was performed following the standard protocol without intravenous contrast. Multiplanar CT image reconstructions of the cervical spine were also generated. RADIATION DOSE REDUCTION: This exam was performed according to the departmental dose-optimization program which includes automated exposure control, adjustment of the mA and/or kV according to patient size and/or use of iterative reconstruction technique. COMPARISON:  None Available. FINDINGS: CT HEAD FINDINGS Brain: No evidence of acute infarction, hemorrhage, hydrocephalus, extra-axial collection or mass lesion/mass effect. Vascular: No hyperdense vessel. Skull: No acute fracture. Sinuses/Orbits: Clear sinuses.  No acute orbital findings. Other: No mastoid effusions. Heterogeneous appearance of the parotid glands with many small calcifications. CT CERVICAL SPINE FINDINGS Alignment: No substantial sagittal subluxation. Skull base and vertebrae: Mild height loss of upper thoracic vertebral bodies appears similar to CT of the chest from Apr 27, 2017. No  evidence of acute fracture. Soft tissues and spinal canal: No prevertebral fluid or swelling. No visible canal hematoma. Disc levels:  Moderate multilevel degenerative change. Upper chest: Visualized lung apices are clear. IMPRESSION: 1. No evidence of acute abnormality intracranially or in the cervical spine. 2. Heterogeneous appearance of the parotid glands with many small calcifications, which suggests chronic inflammation (such as with Sjogren's). Electronically Signed   By: Feliberto Harts M.D.   On: 11/05/2023 10:43    EKG: Independently reviewed.  ST elevation in lead 2 3 and aVF  Assessment/Plan Principal Problem:   STEMI (ST elevation myocardial infarction) [facility]) Active Problems:   Syncope and collapse   Heart attack (HCC)  (please populate well all problems here in Problem List. (For example, if patient is on BP meds at home and you resume or decide to hold them, it is a problem that needs to be her. Same for CAD, COPD, HLD and so on)   STEMI -Successful LCx stenting -Continue medical management with dual antiplatelet aspirin Plavix -Agree with increased statin dosage. -Cardiology consultation appreciated, multivessel disease including LAD and ramus lesions, cardiology recommend outpatient functional study for elective revascularization -Agreed with echocardiogram  Syncope -Strongly suspect MI associated ventricle arrhythmia -Pressure too low to consider beta-blocking right now -Will check Mg and Phos level  Hx of PE/DVT and intracranial venous thrombosis -Discussed with cardiology, who prefers heparin drip for today -Expect patient discharged home on Eliquis tomorrow  CKD stage IIIa -Euvolemic, creatinine level stable -On IV fluid for prophylaxis for IV contrast kidney injury  SLE -Stable, continue CellCept -Continue steroid 10 mg daily  HTN -Hold off home BP today -Agreed with PRN Hydralazine  DVT prophylaxis: Heparin drip Code Status: Full code Family  Communication: None at bedside Disposition Plan: Expect 1 to 2 days hospital stay Consults called: Cardiology Admission status: ICU   Emeline General MD [facility] Pager [phone]  11/05/2023, 2:40 PM

## 2023-11-05 NOTE — Consult Note (Signed)
NAME:  Jonathon Snow, MRN:  [id number], DOB:  1961-04-08, LOS: 0 ADMISSION DATE:  11/05/2023, CONSULTATION DATE:  11/05/23 REFERRING MD:  Dr. Okey Dupre, CHIEF COMPLAINT:  CODE STEMI  History of Present Illness:  62 yo M presenting to [facility] ED on 11/14 from home via EMS for evaluation of possible CODE STEMI after syncopal episode.  History obtained per chart review  and patient bedside report. Patient was cooking at home when he began to feel light-headed and dizzy, saw spots, ultimately synopsizing and falling backwards- hitting his head. Unsure length of unconsciousness, but patient called EMS himself. He is on Eliquis for history of PE/DVT and intracranial thrombus. He denied numbness or weakness in any limbs. Patient did report some chest pain earlier in the day, which has now resolved. Patient reports nausea and diarrhea over the last week and fever/chills over the last few days. Since admission, patient also complaining of LLQ abdominal pain. Patient also endorses chronic dyspnea that is unchanged (however, initially told cardiology he felt his dyspnea was increased from baseline), patient is not on oxygen chronically during the day or nocturnally. He also reported to cardiology mid back pain and diaphoresis. He denies any prior syncopal episodes but does report at times, sometimes when changing position quickly getting dizzy and light-headed. He denies tobacco/ ETOH/ recreational drug use. He denies any history of seizures.  EMS reported initial BP as hypotensive in the 80's which resolved prior to arrival in ED.   ED course: Upon arrival patient alert and oriented with stable vitals. Inferior STE noted & CODE STEMI called and patient taken for urgent PCI. TRH consulted for admission. 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 Patient also has 80-90% distal LCx stenosis concerning for acute plaque rupture. Moderate to severe but non critical LAD and ramus intermedius disease were also noted. PCI to LCx. Surgical consultation for scalp laceration and extraperitoneal soft tissue stranding. Heparin drip held overnight due to bleeding concerns. Patient mildly hypotensive post cath and peripheral levophed initiated. PCCM consulted.  Significant labs: (Labs/ Imaging personally reviewed) I, Cheryll Cockayne Rust-Chester, AGACNP-BC, personally viewed and interpreted this ECG. EKG Interpretation: Date: 11/05/23, EKG Time: 13:08, Rate: 63, Rhythm: NSR, QRS Axis: normal, Intervals: normal, ST/T Wave abnormalities: Inferior STE, Narrative Interpretation: Inferior STEMI Chemistry: Na+: 137, K+: 4.0, BUN/Cr.: 24/1.50, Serum CO2/ AG: 22/6 Hematology: WBC: 8.02, Hgb: 16.8 > 14,  Troponin: 8 > 6 > 10 > 10  CT head wo contrast 11/05/23: Heterogeneous appearance of the parotid glands with many small calcifications, which suggests chronic inflammation (such as with Sjogren's). CT cervical spine wo contrast 11/05/23:  No evidence of acute abnormality intracranially or in the cervical spine CT abdomen/pelvis wo contrast 11/05/23: pending  PCCM consulted for assistance in management and monitoring due to intermittent peripheral vasopressor requirements.  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  Interim History / Subjective:  Patient alert and responsive, stating he just wants  to sleep.  Reports some mild back and head pain from fall. Not interested in pain medication at this time, stating he just wants to try to sleep. All questions and concerns answered at this time. Levophed currently running at 1 mcg  Objective   Blood pressure 137/75, pulse 65, temperature 97.7 F (36.5 C), temperature source Oral, resp. rate 20, height 6\' 2"  (1.88 m), weight 102.8 kg, SpO2 100%.        Intake/Output Summary (Last 24 hours) at 11/05/2023 2033 Last data filed at 11/05/2023 1900 Gross per 24 hour  Intake 1858.64 ml  Output 300 ml  Net 1558.64 ml   Filed Weights   11/05/23 1016 11/05/23 1301  Weight: 107.9 kg 102.8 kg    Examination: General: Adult male, acutely ill, lying in bed NAD HEENT: MM pink/moist, anicteric, closed- non bleeding scalp laceration, neck supple Neuro: A&O x 4, able to follow commands, PERRL +3, MAE CV: s1s2 RRR, NSR on monitor, no r/m/g Pulm: Regular, non labored on RA , breath sounds clear-BUL & clear/diminished-BLL GI: soft, rounded, mild LLQ tenderness, bs x 4 Skin: abdominal ecchymosis, posterior scalp laceration Extremities: warm/dry, pulses + 2 R/P, no edema noted  Resolved Hospital Problem list     Assessment & Plan:  Mild Hypotension possible vagal response - continue peripheral levophed, wean as tolerated to maintain MAP > 65 - continue NS infusion overnight - hold outpatient antihypertensive medication overnight  Inferior STEMI s/p PCI to LCx PMHx: HTN, PE/DVT and intracerebral venous thrombosis, HLD, HFpEF - echocardiogram ordered - heparin held overnight due to traumatic fall with injury - cardiology following, appreciate input - rosuvastatin increased by cardiology   Syncope Acute on Chronic Back Pain Scalp Laceration Abdominal Hematoma - f/u CT abdomen/pelvis results - continue pain management PRN - echo as above, heparin on hold as above - general surgery following, please alert if ongoing bleeding occurs- keep staple gun  available for surgery PRN - H&H Q 6 overnight  CKD stage 3a - Strict I/O's: alert provider if UOP < 0.5 mL/kg/hr - gentle IVF hydration  - Daily BMP, replace electrolytes PRN - Avoid nephrotoxic agents as able, ensure adequate renal perfusion - bladder scan  Chronic SLE - outpatient regimen continued per primary  Nausea/Diarrhea s/t unknown etiology Patient also described fever & chills, however did report diaphoresis to cardiology - continue Zofran PRN, utilize phenergan if needed - will consider additional CT brain imaging if patient begins to vomit - f/u Ct abdomen/pelvis results - daily CBC, monitor WBC/fever trend  Best Practice (right click and "Reselect all SmartList Selections" daily)  Diet/type: Regular consistency (see orders) DVT prophylaxis: SCD GI prophylaxis: N/A Lines: N/A Foley:  N/A Code Status:  full code Last date of multidisciplinary goals of care discussion [per primary- 11/14]  Labs   CBC: Recent Labs  Lab 11/05/23 1015 11/05/23 1757  WBC 8.0 8.8  NEUTROABS 6.8 7.2  HGB 16.8 14.0  HCT 52.9* 42.9  MCV 93.1 90.5  PLT 149* 111*    Basic Metabolic Panel: Recent Labs  Lab 11/05/23 1015 11/05/23 1840  NA 140 137  K 3.9 4.0  CL 105 109  CO2 25 22  GLUCOSE 119* 96  BUN 24* 24*  CREATININE 1.64* 1.50*  CALCIUM 9.2 7.9*  MG 2.1  --   PHOS 2.6  --    GFR: Estimated Creatinine Clearance: 66.1 mL/min (A) (by C-G formula based on SCr of 1.5 mg/dL (H)). Recent Labs  Lab 11/05/23 1015 11/05/23 1757  WBC 8.0 8.8    Liver Function Tests: Recent Labs  Lab 11/05/23 1015 11/05/23 1840  AST 21 17  ALT 19 17  ALKPHOS 73 57  BILITOT 1.2* 1.2*  PROT 7.9 5.9*  ALBUMIN 4.4 3.4*   Recent Labs  Lab 11/05/23 1840  LIPASE 34   No results for input(s): "AMMONIA" in the last 168 hours.  ABG    Component Value Date/Time   PHART 7.32 (L) 02/22/2018 0500   PCO2ART 50 (H) 02/22/2018 0500   PO2ART 119 (H) 02/22/2018 0500   HCO3 25.8  02/22/2018 0500   TCO2 26 12/09/2013 2348   ACIDBASEDEF 1.0 02/22/2018 0500   O2SAT 98.3 02/22/2018 0500     Coagulation Profile: Recent Labs  Lab 11/05/23 1015  INR 1.2    Cardiac Enzymes: No results for input(s): "CKTOTAL", "CKMB", "CKMBINDEX", "TROPONINI" in the last 168 hours.  HbA1C: Hgb A1c MFr Bld  Date/Time Value Ref Range Status  10/28/2023 01:59 PM 6.0 (H) <5.7 % of total Hgb Final    Comment:    For someone without known diabetes, a hemoglobin  A1c value between 5.7% and 6.4% is consistent with prediabetes and should be confirmed with a  follow-up test. . For someone with known diabetes, a value <7% indicates that their diabetes is well controlled. A1c targets should be individualized based on duration of diabetes, age, comorbid conditions, and other considerations. . This assay result is consistent with an increased risk of diabetes. . Currently, no consensus exists regarding use of hemoglobin A1c for diagnosis of diabetes for children. [name]   09/09/2022 10:40 AM 5.5 <5.7 % of total Hgb Final    Comment:    For the purpose of screening for the presence of diabetes: . <5.7%       Consistent with the absence of diabetes 5.7-6.4%    Consistent with increased risk for diabetes             (prediabetes) > or =6.5%  Consistent with diabetes . This assay result is consistent with a decreased risk of diabetes. . Currently, no consensus exists regarding use of hemoglobin A1c for diagnosis of diabetes in children. . According to American Diabetes Association (ADA) guidelines, hemoglobin A1c <7.0% represents optimal control in non-pregnant diabetic patients. Different metrics may apply to specific patient populations.  Standards of Medical Care in Diabetes(ADA). .     CBG: Recent Labs  Lab 11/05/23 1256  GLUCAP 93    Review of Systems:   Gen: Denies fever, chills, weight change, fatigue, night sweats HEENT: Denies blurred vision, double vision,  hearing loss, tinnitus, sinus congestion, rhinorrhea, sore throat, neck stiffness, dysphagia PULM: Denies shortness of breath, cough, sputum production, hemoptysis, wheezing CV: Denies chest pain, edema, orthopnea, paroxysmal nocturnal dyspnea, palpitations GI: Denies abdominal pain, nausea, vomiting, diarrhea, hematochezia, melena, constipation, change in bowel habits GU: Denies dysuria, hematuria, polyuria, oliguria, urethral discharge Endocrine: Denies hot or cold intolerance, polyuria, polyphagia or appetite change Derm: Denies rash, dry skin, scaling or peeling skin change Heme: Denies easy bruising, bleeding, bleeding gums Neuro: Denies headache, numbness, weakness, slurred speech, loss of memory or consciousness  Past Medical History:  He,  has a past medical history of Calculus of kidney (08/21/2013), Cerebral venous sinus thrombosis (08/21/2013), COVID-19 virus infection (12/2020), Depression, DVT (deep venous thrombosis) (HCC), Dyspnea, GERD (gastroesophageal reflux disease), Heel spur, left (02/16/2019), Heel spur, right (02/16/2019), Herpes zoster infection of lumbar region (02/20/2020), History of kidney stones, Hyperlipidemia, Hypertension, Lupus, Lymphedema (10/07/2018), Morbid obesity (HCC),  Opiate abuse, episodic (HCC) (02/26/2018), Osteoporosis, Pneumonia, PONV (postoperative nausea and vomiting), Postphlebitic syndrome with ulcer, left (HCC) (11/18/2016), Presence of IVC filter (03/22/2020), Pulmonary embolism (HCC), and Renal disorder.   Surgical History:   Past Surgical History:  Procedure Laterality Date   ANKLE SURGERY Right    BACK SURGERY     BRONCHIAL WASHINGS N/A 11/01/2021   Procedure: BRONCHIAL WASHINGS;  Surgeon: Vida Rigger, MD;  Location: [facility] ORS;  Service: Thoracic;  Laterality: N/A;   COLONOSCOPY WITH PROPOFOL N/A 05/28/2020   Procedure: COLONOSCOPY WITH PROPOFOL;  Surgeon: Wyline Mood, MD;  Location: [facility] ENDOSCOPY;  Service: Endoscopy;  Laterality: N/A;    COLONOSCOPY WITH PROPOFOL N/A 07/01/2023   Procedure: COLONOSCOPY WITH PROPOFOL;  Surgeon: Wyline Mood, MD;  Location: [facility] ENDOSCOPY;  Service: Gastroenterology;  Laterality: N/A;   CYST EXCISION  92 or 93    Liver cyst removal [facility]   FLEXIBLE BRONCHOSCOPY N/A 11/01/2021   Procedure: FLEXIBLE BRONCHOSCOPY;  Surgeon: Vida Rigger, MD;  Location: [facility] ORS;  Service: Thoracic;  Laterality: N/A;   HIP PINNING,CANNULATED Left 02/19/2023   Procedure: PERCUTANEOUS FIXATION OF FEMORAL NECK;  Surgeon: Signa Kell, MD;  Location: [facility] ORS;  Service: Orthopedics;  Laterality: Left;   I & D EXTREMITY Right 04/29/2017   Procedure: IRRIGATION AND DEBRIDEMENT EXTREMITY;  Surgeon: Ricarda Frame, MD;  Location: [facility] ORS;  Service: General;  Laterality: Right;   IRRIGATION AND DEBRIDEMENT ABSCESS Left 04/29/2017   Procedure: IRRIGATION AND DEBRIDEMENT Scrotal ABSCESS;  Surgeon: Ricarda Frame, MD;  Location: [facility] ORS;  Service: General;  Laterality: Left;   POLYPECTOMY  07/01/2023   Procedure: POLYPECTOMY;  Surgeon: Wyline Mood, MD;  Location: [facility] ENDOSCOPY;  Service: Gastroenterology;;     Social History:   reports that he has never smoked. He has never used smokeless tobacco. He reports that he does not drink alcohol and does not use drugs.   Family History:  His family history includes Clotting disorder in his mother; Heart attack in his maternal grandfather, maternal grandmother, and paternal grandfather; Heart disease in his father; Hypertension in his father; Kidney disease in his brother.   Allergies Allergies  Allergen Reactions   Enalapril Other (See Comments)    Unknown reaction   Vicodin [Hydrocodone-Acetaminophen] Hives and Rash    Severe headaches (also) NAME BRAND ONLY PER PT CAN TAKE GENERIC     Home Medications  Prior to Admission medications   Medication Sig Start Date End Date Taking? Authorizing Provider  albuterol (VENTOLIN HFA) 108 (90 Base) MCG/ACT inhaler Inhale 2  puffs into the lungs every 6 (six) hours as needed for wheezing or shortness of breath. 10/29/22   Berniece Salines, FNP  amLODipine (NORVASC) 5 MG tablet Take 1 tablet (5 mg total) by mouth daily. 04/07/23   Danelle Berry, PA-C  apixaban (ELIQUIS) 5 MG TABS tablet Take 1 tablet (5 mg total) by mouth 2 (two) times daily. 09/29/23   Alinda Dooms, NP  ARIPiprazole (ABILIFY) 2 MG tablet  06/26/23   [provider]  cholecalciferol (VITAMIN D3) 25 MCG (1000 UNIT) tablet Take 1,000 Units by mouth daily.    [provider]  gabapentin (NEURONTIN) 300 MG capsule Take 1 capsule (300 mg total) by mouth 3 (three) times daily. 10/28/23   Berniece Salines, FNP  hydrOXYzine (ATARAX) 25 MG tablet Take 25 mg by mouth 2 (two) times daily. 08/13/23   [provider]  levocetirizine (XYZAL) 5 MG tablet Take 1 tablet (5 mg total) by mouth every  evening. 12/08/22   Danelle Berry, PA-C  LORazepam (ATIVAN) 0.5 MG tablet Take 1 tablet (0.5 mg total) by mouth 2 (two) times daily as needed for anxiety. 04/24/23   Danelle Berry, PA-C  losartan (COZAAR) 100 MG tablet Take 1 tablet (100 mg total) by mouth daily. 04/07/23   Danelle Berry, PA-C  montelukast (SINGULAIR) 10 MG tablet Take 1 tablet (10 mg total) by mouth at bedtime. 12/08/22   Danelle Berry, PA-C  Multiple Vitamins-Minerals (MULTIVITAMIN WITH MINERALS) tablet Take 1 tablet by mouth daily.    [provider]  mycophenolate (CELLCEPT) 500 MG tablet Take 1,000 mg by mouth 2 (two) times daily.    [provider]  predniSONE (DELTASONE) 10 MG tablet Take 1 tablet by mouth daily. 11/28/22   [provider]  rosuvastatin (CRESTOR) 10 MG tablet Take 1 tablet (10 mg total) by mouth daily. 04/07/23   Danelle Berry, PA-C  sertraline (ZOLOFT) 100 MG tablet Take 1 tablet (100 mg total) by mouth daily. 07/07/23   Danelle Berry, PA-C  tadalafil (CIALIS) 20 MG tablet Take 1 tablet (20 mg total) by mouth daily as needed for erectile dysfunction.  07/22/23   Vanna Scotland, MD  traMADol (ULTRAM) 50 MG tablet Take 1 tablet (50 mg total) by mouth every 6 (six) hours as needed for moderate pain. 02/20/23   Dedra Skeens, PA-C     Critical care time: 65 minutes       Cheryll Cockayne Rust-Chester, AGACNP-BC Acute Care Nurse Practitioner [facility]   [phone] / [phone] Please see Amion for details.

## 2023-11-05 NOTE — Consult Note (Signed)
PHARMACY - ANTICOAGULATION CONSULT NOTE  Pharmacy Consult for Heparin  Indication: History of DVT/PE and cerebral venous sinus thrombosis   Allergies  Allergen Reactions   Enalapril Other (See Comments)    Unknown reaction   Vicodin [Hydrocodone-Acetaminophen] Hives and Rash    Severe headaches (also) NAME BRAND ONLY PER PT CAN TAKE GENERIC   Patient Measurements: Height: 6\' 2"  (188 cm) Weight: 102.8 kg (226 lb 10.1 oz) IBW/kg (Calculated) : 82.2 Heparin Dosing Weight: 102.8 kg   Vital Signs: Temp: 97.7 F (36.5 C) (11/14 1301) Temp Source: Oral (11/14 1301) BP: 129/73 (11/14 1301) Pulse Rate: 70 (11/14 1301)  Labs: Recent Labs    11/05/23 1015  HGB 16.8  HCT 52.9*  PLT 149*  APTT 23*  LABPROT 15.1  INR 1.2  CREATININE 1.64*  TROPONINIHS 8   Estimated Creatinine Clearance: 60.5 mL/min (A) (by C-G formula based on SCr of 1.64 mg/dL (H)).  Medical History: 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   Overview:  superior sagittal sinus, left transverse sinus and cortical veins    COVID-19 virus infection 12/2020   Depression    DVT (deep venous thrombosis) (HCC)    Dyspnea    GERD (gastroesophageal reflux disease)    Heel spur, left 02/16/2019   Heel spur, right 02/16/2019   Herpes zoster infection of lumbar region 02/20/2020   History of kidney stones    Hyperlipidemia    Hypertension    Lupus    Lymphedema 10/07/2018   Morbid obesity (HCC)    Opiate abuse, episodic (HCC) 02/26/2018   Osteoporosis    Pneumonia    PONV (postoperative nausea and vomiting)    Postphlebitic syndrome with ulcer, 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   Presence of IVC filter 03/22/2020   Removed   Pulmonary embolism (HCC)    Renal disorder    Stage III   Medications:  PTA apixaban - last dose taken this morning around 8 AM   Assessment: Jonathon Snow is a 62 year old male with past medical history of PE on  Eliquis, HTN, CKD, and lupus who presents to the ED complaining of syncope, SOB, headache, and neck/back pain. EKG concerning for STEMI with inferior ST elevation. He was taken to the cath lab for emergent cardiac catheterization and PCI. Pharmacy has been consulted for management of heparin infusion for history of DVT/PE and cerebral venous sinus thrombosis. Baseline labs: aPTT 23, INR 1.2, HL > 1.10, Hgb 16.8   Goal of Therapy:  Heparin level 0.3 - 0.7  aPTT level 66 - 102  Monitor platelets by anticoagulation protocol: Yes   Plan:  Will avoid initial bolus as patient took last dose of apixaban this morning  Start heparin infusion this evening at 1250 units/hr when next dose of apixaban would be due Check aPTT 6 hours after start of infusion and until correlating with HL Monitor CBC and HL daily while on heparin   Littie Deeds, PharmD Pharmacy Resident  11/05/2023 1:15 PM

## 2023-11-06 ENCOUNTER — Inpatient Hospital Stay: Payer: Medicaid Other | Admitting: General Practice

## 2023-11-06 ENCOUNTER — Encounter: Payer: Self-pay | Admitting: Internal Medicine

## 2023-11-06 ENCOUNTER — Encounter
Admission: EM | Disposition: A | Discharge status: Home / Self Care | Payer: Self-pay | Source: Home / Self Care | Attending: Osteopathic Medicine

## 2023-11-06 ENCOUNTER — Inpatient Hospital Stay (HOSPITAL_COMMUNITY)
Admit: 2023-11-06 | Discharge: 2023-11-06 | Disposition: A | Discharge status: Home / Self Care | Payer: Medicaid Other | Attending: Internal Medicine | Admitting: Internal Medicine

## 2023-11-06 ENCOUNTER — Inpatient Hospital Stay: Payer: Medicaid Other

## 2023-11-06 DIAGNOSIS — M532X4 Spinal instabilities, thoracic region: Secondary | ICD-10-CM | POA: Diagnosis not present

## 2023-11-06 DIAGNOSIS — S22008A Other fracture of unspecified thoracic vertebra, initial encounter for closed fracture: Secondary | ICD-10-CM | POA: Diagnosis not present

## 2023-11-06 DIAGNOSIS — I2121 ST elevation (STEMI) myocardial infarction involving left circumflex coronary artery: Secondary | ICD-10-CM | POA: Diagnosis not present

## 2023-11-06 DIAGNOSIS — R579 Shock, unspecified: Secondary | ICD-10-CM

## 2023-11-06 DIAGNOSIS — S22079A Unspecified fracture of T9-T10 vertebra, initial encounter for closed fracture: Secondary | ICD-10-CM | POA: Diagnosis present

## 2023-11-06 DIAGNOSIS — S22078A Other fracture of T9-T10 vertebra, initial encounter for closed fracture: Secondary | ICD-10-CM

## 2023-11-06 DIAGNOSIS — R55 Syncope and collapse: Secondary | ICD-10-CM

## 2023-11-06 HISTORY — DX: Shock, unspecified: R57.9

## 2023-11-06 LAB — PHOSPHORUS: Phosphorus: 3.8 mg/dL (ref 2.5–4.6)

## 2023-11-06 LAB — BASIC METABOLIC PANEL
Anion gap: 9 (ref 5–15)
BUN: 28 mg/dL — ABNORMAL HIGH (ref 8–23)
CO2: 22 mmol/L (ref 22–32)
Calcium: 8 mg/dL — ABNORMAL LOW (ref 8.9–10.3)
Chloride: 105 mmol/L (ref 98–111)
Creatinine, Ser: 1.78 mg/dL — ABNORMAL HIGH (ref 0.61–1.24)
GFR, Estimated: 43 mL/min — ABNORMAL LOW (ref >60)
Glucose, Bld: 110 mg/dL — ABNORMAL HIGH (ref 70–99)
Potassium: 4.4 mmol/L (ref 3.5–5.1)
Sodium: 136 mmol/L (ref 135–145)

## 2023-11-06 LAB — CBC
HCT: 40.3 % (ref 39.0–52.0)
Hemoglobin: 13 g/dL (ref 13.0–17.0)
MCH: 29.2 pg (ref 26.0–34.0)
MCHC: 32.3 g/dL (ref 30.0–36.0)
MCV: 90.6 fL (ref 80.0–100.0)
Platelets: 110 10*3/uL — ABNORMAL LOW (ref 150–400)
RBC: 4.45 MIL/uL (ref 4.22–5.81)
RDW: 15.8 % — ABNORMAL HIGH (ref 11.5–15.5)
WBC: 7.6 10*3/uL (ref 4.0–10.5)
nRBC: 0 % (ref 0.0–0.2)

## 2023-11-06 LAB — ECHOCARDIOGRAM COMPLETE
AR max vel: 1.99 cm2
AV Area VTI: 1.97 cm2
AV Area mean vel: 1.64 cm2
AV Mean grad: 8 mm[Hg]
AV Peak grad: 11 mm[Hg]
Ao pk vel: 1.66 m/s
Area-P 1/2: 4.65 cm2
Height: 74 in
MV VTI: 2.85 cm2
S' Lateral: 3 cm
Weight: 3626.13 [oz_av]

## 2023-11-06 LAB — HEMOGLOBIN AND HEMATOCRIT, BLOOD
HCT: 40.8 % (ref 39.0–52.0)
Hemoglobin: 13 g/dL (ref 13.0–17.0)

## 2023-11-06 LAB — TYPE AND SCREEN
ABO/RH(D): A POS
Antibody Screen: NEGATIVE

## 2023-11-06 LAB — MAGNESIUM: Magnesium: 2 mg/dL (ref 1.7–2.4)

## 2023-11-06 SURGERY — POSTERIOR THORACIC FUSION 4 LEVELS
Anesthesia: General

## 2023-11-06 MED ORDER — DEXAMETHASONE SODIUM PHOSPHATE 10 MG/ML IJ SOLN
INTRAMUSCULAR | Status: DC | PRN
  Administered 2023-11-06: 8 mg via INTRAVENOUS

## 2023-11-06 MED ORDER — FENTANYL CITRATE (PF) 250 MCG/5ML IJ SOLN
INTRAMUSCULAR | Status: AC
  Filled 2023-11-06: qty 5

## 2023-11-06 MED ORDER — PROPOFOL 1000 MG/100ML IV EMUL
INTRAVENOUS | Status: AC
  Filled 2023-11-06: qty 100

## 2023-11-06 MED ORDER — SORBITOL 70 % SOLN: 30.0000 mL | Freq: Every day | Status: DC | PRN

## 2023-11-06 MED ORDER — ACETAMINOPHEN 500 MG PO TABS
1000.0000 mg | ORAL_TABLET | Freq: Four times a day (QID) | ORAL | Status: DC
  Administered 2023-11-06 – 2023-11-13 (×22): 1000 mg via ORAL
  Filled 2023-11-06 (×28): qty 2

## 2023-11-06 MED ORDER — EPHEDRINE SULFATE-NACL 50-0.9 MG/10ML-% IV SOSY
PREFILLED_SYRINGE | INTRAVENOUS | Status: DC | PRN
  Administered 2023-11-06 (×3): 5 mg via INTRAVENOUS

## 2023-11-06 MED ORDER — BUPIVACAINE-EPINEPHRINE (PF) 0.5% -1:200000 IJ SOLN
INTRAMUSCULAR | Status: AC
  Filled 2023-11-06: qty 10

## 2023-11-06 MED ORDER — KETAMINE HCL 10 MG/ML IJ SOLN
INTRAMUSCULAR | Status: DC | PRN
  Administered 2023-11-06: 30 mg via INTRAVENOUS
  Administered 2023-11-06: 20 mg via INTRAVENOUS

## 2023-11-06 MED ORDER — KETAMINE HCL 50 MG/5ML IJ SOSY
PREFILLED_SYRINGE | INTRAMUSCULAR | Status: AC
  Filled 2023-11-06: qty 5

## 2023-11-06 MED ORDER — CALCIUM CHLORIDE 10 % IV SOLN
INTRAVENOUS | Status: DC | PRN
  Administered 2023-11-06: 500 mg via INTRAVENOUS

## 2023-11-06 MED ORDER — PHENYLEPHRINE HCL-NACL 20-0.9 MG/250ML-% IV SOLN
INTRAVENOUS | Status: DC | PRN
  Administered 2023-11-06: 40 ug/min via INTRAVENOUS

## 2023-11-06 MED ORDER — POLYETHYLENE GLYCOL 3350 17 G PO PACK
17.0000 g | PACK | Freq: Every day | ORAL | Status: DC | PRN
  Administered 2023-11-09: 17 g via ORAL
  Filled 2023-11-06: qty 1

## 2023-11-06 MED ORDER — MIDAZOLAM HCL 2 MG/2ML IJ SOLN
INTRAMUSCULAR | Status: DC | PRN
  Administered 2023-11-06: 2 mg via INTRAVENOUS

## 2023-11-06 MED ORDER — MORPHINE SULFATE (PF) 2 MG/ML IV SOLN
1.0000 mg | INTRAVENOUS | Status: DC | PRN
  Administered 2023-11-06: 1 mg via INTRAVENOUS
  Filled 2023-11-06: qty 1

## 2023-11-06 MED ORDER — METHOCARBAMOL 1000 MG/10ML IJ SOLN: 500.0000 mg | Freq: Four times a day (QID) | INTRAMUSCULAR | Status: DC | PRN

## 2023-11-06 MED ORDER — FENTANYL CITRATE (PF) 100 MCG/2ML IJ SOLN
INTRAMUSCULAR | Status: DC | PRN
  Administered 2023-11-06: 50 ug via INTRAVENOUS
  Administered 2023-11-06 (×2): 100 ug via INTRAVENOUS

## 2023-11-06 MED ORDER — SENNA 8.6 MG PO TABS
1.0000 | ORAL_TABLET | Freq: Two times a day (BID) | ORAL | Status: DC
  Administered 2023-11-06 – 2023-11-12 (×10): 8.6 mg via ORAL
  Filled 2023-11-06 (×12): qty 1

## 2023-11-06 MED ORDER — METHOCARBAMOL 500 MG PO TABS
500.0000 mg | ORAL_TABLET | Freq: Four times a day (QID) | ORAL | Status: DC | PRN
  Administered 2023-11-09 – 2023-11-11 (×5): 500 mg via ORAL
  Filled 2023-11-06 (×6): qty 1

## 2023-11-06 MED ORDER — LIDOCAINE HCL (CARDIAC) PF 100 MG/5ML IV SOSY
PREFILLED_SYRINGE | INTRAVENOUS | Status: DC | PRN
  Administered 2023-11-06: 80 mg via INTRAVENOUS

## 2023-11-06 MED ORDER — OXYCODONE HCL 5 MG PO TABS
5.0000 mg | ORAL_TABLET | ORAL | Status: DC | PRN
  Administered 2023-11-07: 5 mg via ORAL
  Filled 2023-11-06 (×2): qty 1

## 2023-11-06 MED ORDER — BUPIVACAINE HCL (PF) 0.5 % IJ SOLN
INTRAMUSCULAR | Status: AC
  Filled 2023-11-06: qty 30

## 2023-11-06 MED ORDER — SODIUM CHLORIDE 0.9 % IV SOLN: INTRAVENOUS | Status: DC | PRN

## 2023-11-06 MED ORDER — PROPOFOL 10 MG/ML IV BOLUS
INTRAVENOUS | Status: AC
  Filled 2023-11-06: qty 40

## 2023-11-06 MED ORDER — FENTANYL CITRATE (PF) 100 MCG/2ML IJ SOLN: 25.0000 ug | INTRAMUSCULAR | Status: DC | PRN

## 2023-11-06 MED ORDER — REMIFENTANIL HCL 1 MG IV SOLR
INTRAVENOUS | Status: AC
  Filled 2023-11-06: qty 1000

## 2023-11-06 MED ORDER — 0.9 % SODIUM CHLORIDE (POUR BTL) OPTIME
TOPICAL | Status: DC | PRN
  Administered 2023-11-06: 500 mL

## 2023-11-06 MED ORDER — ACETAMINOPHEN 10 MG/ML IV SOLN
INTRAVENOUS | Status: DC | PRN
  Administered 2023-11-06: 1000 mg via INTRAVENOUS

## 2023-11-06 MED ORDER — CALCIUM CHLORIDE 10 % IV SOLN
INTRAVENOUS | Status: AC
Start: 2023-11-06 — End: ?
  Filled 2023-11-06: qty 10

## 2023-11-06 MED ORDER — IRRISEPT - 450ML BOTTLE WITH 0.05% CHG IN STERILE WATER, USP 99.95% OPTIME
TOPICAL | Status: DC | PRN
  Administered 2023-11-06: 450 mL

## 2023-11-06 MED ORDER — OXYCODONE HCL 5 MG/5ML PO SOLN
5.0000 mg | Freq: Once | ORAL | Status: DC | PRN
Start: 2023-11-06 — End: 2023-11-06

## 2023-11-06 MED ORDER — REMIFENTANIL HCL 1 MG IV SOLR
INTRAVENOUS | Status: DC | PRN
  Administered 2023-11-06: .1 ug/kg/min via INTRAVENOUS

## 2023-11-06 MED ORDER — OXYCODONE HCL 5 MG PO TABS: 5.0000 mg | ORAL_TABLET | Freq: Once | ORAL | Status: DC | PRN

## 2023-11-06 MED ORDER — SODIUM CHLORIDE (PF) 0.9 % IJ SOLN
INTRAMUSCULAR | Status: AC
Start: 2023-11-06 — End: ?
  Filled 2023-11-06: qty 20

## 2023-11-06 MED ORDER — MAGNESIUM CITRATE PO SOLN: 1.0000 | Freq: Once | ORAL | Status: DC | PRN

## 2023-11-06 MED ORDER — DOCUSATE SODIUM 100 MG PO CAPS
100.0000 mg | ORAL_CAPSULE | Freq: Two times a day (BID) | ORAL | Status: DC
Start: 2023-11-06 — End: 2023-11-13
  Administered 2023-11-06 – 2023-11-12 (×10): 100 mg via ORAL
  Filled 2023-11-06 (×12): qty 1

## 2023-11-06 MED ORDER — BUPIVACAINE-EPINEPHRINE 0.5% -1:200000 IJ SOLN
INTRAMUSCULAR | Status: DC | PRN
  Administered 2023-11-06: 15 mL

## 2023-11-06 MED ORDER — PROPOFOL 10 MG/ML IV BOLUS
INTRAVENOUS | Status: DC | PRN
  Administered 2023-11-06: 50 mg via INTRAVENOUS
  Administered 2023-11-06: 80 ug/kg/min via INTRAVENOUS
  Administered 2023-11-06: 150 mg via INTRAVENOUS
  Administered 2023-11-06: 100 mg via INTRAVENOUS

## 2023-11-06 MED ORDER — HYDROMORPHONE HCL 1 MG/ML IJ SOLN
0.5000 mg | INTRAMUSCULAR | Status: DC | PRN
Start: 2023-11-06 — End: 2023-11-07
  Administered 2023-11-06: 1 mg via INTRAVENOUS
  Filled 2023-11-06: qty 1

## 2023-11-06 MED ORDER — PHENYLEPHRINE 80 MCG/ML (10ML) SYRINGE FOR IV PUSH (FOR BLOOD PRESSURE SUPPORT)
PREFILLED_SYRINGE | INTRAVENOUS | Status: DC | PRN
  Administered 2023-11-06 (×2): 160 ug via INTRAVENOUS
  Administered 2023-11-06: 80 ug via INTRAVENOUS
  Administered 2023-11-06 (×2): 160 ug via INTRAVENOUS
  Administered 2023-11-06 (×4): 80 ug via INTRAVENOUS

## 2023-11-06 MED ORDER — OXYCODONE HCL 5 MG PO TABS
10.0000 mg | ORAL_TABLET | ORAL | Status: DC | PRN
  Administered 2023-11-06 – 2023-11-07 (×3): 10 mg via ORAL
  Filled 2023-11-06 (×3): qty 2

## 2023-11-06 MED ORDER — SODIUM CHLORIDE (PF) 0.9 % IJ SOLN
INTRAMUSCULAR | Status: DC | PRN
  Administered 2023-11-06: 60 mL

## 2023-11-06 MED ORDER — MIDAZOLAM HCL 2 MG/2ML IJ SOLN
INTRAMUSCULAR | Status: AC
  Filled 2023-11-06: qty 2

## 2023-11-06 MED ORDER — ONDANSETRON HCL 4 MG/2ML IJ SOLN
INTRAMUSCULAR | Status: DC | PRN
  Administered 2023-11-06: 4 mg via INTRAVENOUS

## 2023-11-06 MED ORDER — SUCCINYLCHOLINE CHLORIDE 200 MG/10ML IV SOSY
PREFILLED_SYRINGE | INTRAVENOUS | Status: DC | PRN
  Administered 2023-11-06: 50 mg via INTRAVENOUS
  Administered 2023-11-06: 100 mg via INTRAVENOUS

## 2023-11-06 MED ORDER — BUPIVACAINE LIPOSOME 1.3 % IJ SUSP
INTRAMUSCULAR | Status: AC
Start: 2023-11-06 — End: ?
  Filled 2023-11-06: qty 20

## 2023-11-06 MED ORDER — ACETAMINOPHEN 10 MG/ML IV SOLN
INTRAVENOUS | Status: AC
Start: 2023-11-06 — End: ?
  Filled 2023-11-06: qty 100

## 2023-11-06 MED ORDER — SURGIFLO WITH THROMBIN (HEMOSTATIC MATRIX KIT) OPTIME
TOPICAL | Status: DC | PRN
  Administered 2023-11-06 (×3): 1 via TOPICAL

## 2023-11-06 MED ORDER — SODIUM CHLORIDE 0.9% IV SOLUTION: Freq: Once | INTRAVENOUS | Status: AC

## 2023-11-06 MED ORDER — CEFAZOLIN SODIUM-DEXTROSE 2-3 GM-%(50ML) IV SOLR
INTRAVENOUS | Status: DC | PRN
  Administered 2023-11-06: 2 g via INTRAVENOUS

## 2023-11-06 SURGICAL SUPPLY — 61 items
ALLOGRAFT BONESTRIP KORE 2.5X5 (Bone Implant) IMPLANT
BASIN KIT SINGLE STR (MISCELLANEOUS) ×1 IMPLANT
BLADE BOVIE TIP EXT 4 (BLADE) IMPLANT
BUR NEURO DRILL SOFT 3.0X3.8M (BURR) ×1 IMPLANT
CEMENT MAX VISCOSITY NUVA (Cement) IMPLANT
DERMABOND ADVANCED .7 DNX12 (GAUZE/BANDAGES/DRESSINGS) ×1 IMPLANT
DRAPE C ARM PK CFD 31 SPINE (DRAPES) ×1 IMPLANT
DRAPE LAPAROTOMY 100X77 ABD (DRAPES) ×1 IMPLANT
DRAPE SCAN PATIENT (DRAPES) IMPLANT
DRSG OPSITE POSTOP 3X4 (GAUZE/BANDAGES/DRESSINGS) IMPLANT
DRSG OPSITE POSTOP 4X12 (GAUZE/BANDAGES/DRESSINGS) IMPLANT
ELECT REM PT RETURN 9FT ADLT (ELECTROSURGICAL) ×1
ELECTRODE REM PT RTRN 9FT ADLT (ELECTROSURGICAL) ×1 IMPLANT
EX-PIN ORTHOLOCK NAV 4X150 (PIN) IMPLANT
FEE INTRAOP CADWELL SUPPLY NCS (MISCELLANEOUS) IMPLANT
FEE INTRAOP MONITOR IMPULS NCS (MISCELLANEOUS) IMPLANT
Fenny replacement tip IMPLANT
GLOVE BIOGEL PI IND STRL 6.5 (GLOVE) ×1 IMPLANT
GLOVE SURG SYN 6.5 ES PF (GLOVE) ×2 IMPLANT
GLOVE SURG SYN 6.5 PF PI (GLOVE) ×2 IMPLANT
GLOVE SURG SYN 8.5 E (GLOVE) ×3 IMPLANT
GLOVE SURG SYN 8.5 PF PI (GLOVE) ×3 IMPLANT
GOWN SRG LRG LVL 4 IMPRV REINF (GOWNS) ×1 IMPLANT
GOWN SRG XL LVL 3 NONREINFORCE (GOWNS) ×1 IMPLANT
GOWN STRL NON-REIN TWL XL LVL3 (GOWNS) ×1
GOWN STRL REIN LRG LVL4 (GOWNS) ×1
GUIDEWIRE NITINOL BEVEL TIP (WIRE) IMPLANT
HOLDER FOLEY CATH W/STRAP (MISCELLANEOUS) ×1 IMPLANT
INTRAOP CADWELL SUPPLY FEE NCS (MISCELLANEOUS) ×1
INTRAOP MONITOR FEE IMPULS NCS (MISCELLANEOUS) ×1
JET LAVAGE IRRISEPT WOUND (IRRIGATION / IRRIGATOR) ×1
KIT PREVENA INCISION MGT 13 (CANNISTER) ×1 IMPLANT
KIT SPINAL PRONEVIEW (KITS) ×1 IMPLANT
LAVAGE JET IRRISEPT WOUND (IRRIGATION / IRRIGATOR) ×1 IMPLANT
MANIFOLD NEPTUNE II (INSTRUMENTS) ×1 IMPLANT
MARKER SKIN DUAL TIP RULER LAB (MISCELLANEOUS) ×1 IMPLANT
MARKER SPHERE PSV REFLC 13MM (MARKER) IMPLANT
NDL AND PUSHER RELINE MAS (NEEDLE) IMPLANT
NDL SAFETY ECLIPSE 18X1.5 (NEEDLE) ×1 IMPLANT
NS IRRIG 1000ML POUR BTL (IV SOLUTION) ×1 IMPLANT
PACK LAMINECTOMY ARMC (PACKS) ×1 IMPLANT
PAD ARMBOARD 7.5X6 YLW CONV (MISCELLANEOUS) ×1 IMPLANT
ROD STRAIGHT RELINE 5.5X500 (Rod) IMPLANT
SCREW LOCK RELINE 5.5 TULIP (Screw) IMPLANT
SCREW RELINE PA 2FC 6.5X50 (Screw) IMPLANT
SCREW RELINE PA 6.5X45 (Screw) IMPLANT
SCREW RELINE PA 6.5X55 (Screw) IMPLANT
SCREW RELINE RED 6.5X45MM POLY (Screw) IMPLANT
STAPLER SKIN PROX 35W (STAPLE) IMPLANT
SURGIFLO W/THROMBIN 8M KIT (HEMOSTASIS) ×1 IMPLANT
SUT STRATA 3-0 15 PS-2 (SUTURE) ×1 IMPLANT
SUT VIC AB 0 CT1 27 (SUTURE) ×2
SUT VIC AB 0 CT1 27XCR 8 STRN (SUTURE) ×2 IMPLANT
SUT VIC AB 2-0 CT1 18 (SUTURE) ×2 IMPLANT
SUT VICRYL 0 UR6 27IN ABS (SUTURE) IMPLANT
SYR 10ML LL (SYRINGE) ×1 IMPLANT
SYR 30ML LL (SYRINGE) ×2 IMPLANT
SYS RELINE MAS CMNT GUIDE QC (NEEDLE) IMPLANT
TIP FENS REPLACE RELINE (MISCELLANEOUS) IMPLANT
TRAP FLUID SMOKE EVACUATOR (MISCELLANEOUS) ×1 IMPLANT
WATER STERILE IRR 1000ML POUR (IV SOLUTION) ×2 IMPLANT

## 2023-11-06 NOTE — Hospital Course (Addendum)
HPI: Jonathon Snow is a 62 y.o. 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 Patient suddenly felt lightheadedness while cooking breakfast alone at home and then passed out and collapsed on the floor and hit the backside of his head. Recovered consciousness by his own and seeing breakfast still cooking on the stove top. Unsure about duration of unconsciousness. Able to call EMS himself.   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  Noncritical stenosis of LAD and ramus, not stented.  CT head and neck negative for fracture or dislocation. Hypotensive requiring pressors, concern w/ hematoma forearm at cath site as well as abdomen, CT showed distraction injury T10, abdominal wall hematoma no concerns for internal hemorrhage. Neurosurgery consult and additional imaging.  11/15: T7-12 posterior fusion, Plt transfusion. Echo - EF 55-60%, no RWMA, G1 diast df ,mild elevated pulm artery pressure, no significant valvular disease.  Stable following surgery  11/16: hypotensive, back on pressors  11/17: off pressors. AKI worsening, nephrology consulted.  11/18: renal function improving, BP improving  11/19: d/c ASA, continue Plavix, restart Eliquis 2.5 mg bid 11/20: renal function now close to normal 11/21: pending SNF rehab 11/22: improving enough not needing rehab at this time, ok for d/c, cardiology will mail Zio monitor, following up w/ PCP, cardiology, neurosurgery    Consultants:  Cardiology  Critical Care General Surgery  Neurosurgery  Nephrology   Procedures/Surgeries: 11/14: cardiac cath w/ DES to the LCX  11/15: arterial line insertion 11/15: Open Reduction and internal fixation of T10 fracture, Posterolateral arthrodesis T7 to  T12      ASSESSMENT & PLAN:   STEMI S/p successful LCx stenting 11/14 Echocardiogram no serious abnormality - EF 55-60%, no RWMA, G1 diast df ,mild elevated pulm artery pressure, no significant valvular disease. Cardiology following DAPT w/ aspirin and clopidogrel for now. After 1 month, aspirin can be stopped and Eliquis resumed.  High potency statin Holding beta blocker and ACE/ARB d/t hypotension in setting of likely spinal shock +/- opiate effect see below  cardiology recs for outpatient functional study for elective revascularization Will need ZIO monitor at discharge!    Hypotension Ddx: neurogenic shock in setting of recent trauma/surgery, complicated by opiate adverse effect, less likely bleeding but watching this closely given thrombocytopenia and hematomas Norepinephrine, additional pressor support as needed - ICU following, pressors off today   IV fluids restarted given AKI Holding antihypertensives  Active bleeding low suspicion but closely monitoring HH/CBC  AKI on CKD stage IIIa Complicated by hx lupus nephritis and hypotension, recent contrast  IV fluids restarted this morning Monitor BMP Avoid nephrotoxins  Given SLE hx, will ask nephrology to consult --> monitor and will follow   Unstable thoracic spine fracture T10 s/p T7-12 posterior fusion 11/15  Neurosurgery following. TLSO brace Pain control - limiting opiates d/t hypotension   Thrombocytopenia  Bleeding/hematoma, high bleed risk  Forearm Hematoma noted 11/14 post-cath at TR band site Monitor CBC SCD DVT Ppx ASA, can restart Plavix Monday 11/18 per neurosurgery    LLQ Abdominal wall hematoma  serial CBCs  Holding heparin, ASA, plavix   Chest pain evening 11/14, subsequently bradycardic and hypotensive - resolved with heart rates into the 40s and systolic pressures in the 60s. Concern for hypovolemia/bleeding, vasovagal urinary  retention vs other, w/ newly found T10 fx possible cord syndrome most  likely cause for neurogenic shock  Pressors - norepi weaned off 11/14 btu back on 11/16 Critical care team has s/o     Urine retention Cath as needed / Foley   Syncope suspect MI associated ventricle arrhythmia vs vasovagal  Pressure too low to consider beta-blocking right now   Scalp laceration Surgical consult for stapling   Hx of PE/DVT and intracranial venous thrombosis On Eliquis prior to hospitalization  Holding anticoag for now given bleeding issues  Expect patient discharged home on Eliquis    HTN Hold home antihypertensives for now given hypotension   SLE Stable holding CellCept and steroids    overweight based on BMI: Body mass index is 29.1 kg/m.  Underweight - under 18.5  normal weight - 18.5 to 24.9 overweight - 25 to 29.9 obese - 30 or more   DVT prophylaxis: SCD IV fluids: gentle continuous IV fluids  given low BP / bleeding concern Nutrition: regular diet  Central lines / invasive devices: none  Code Status: FULL CODE ACP documentation reviewed:  none on file in [facility]  TOC needs: TBD Barriers to dispo / significant pending items: see above

## 2023-11-06 NOTE — TOC CM/SW Note (Signed)
Patient with high risk for readmission score. Patient currently off the unit for procedure.  TOC to follow up

## 2023-11-06 NOTE — Progress Notes (Signed)
PROGRESS NOTE    Jonathon Snow   GNF:[id number] DOB: Nov 02, 1961  DOA: 11/05/2023 Date of Service: 11/06/23 which is hospital day 1  PCP: Danelle Berry, PA-C    HPI: Jonathon Snow is a 62 y.o. 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 Patient suddenly felt lightheadedness while cooking breakfast alone at home and then passed out and collapsed on the floor and hit the backside of his head. Recovered consciousness by his own and seeing breakfast still cooking on the stove top. Unsure about duration of unconsciousness. Able to call EMS himself.   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  Noncritical stenosis of LAD and ramus, not stented.  CT head and neck negative for fracture or dislocation. Hypotensive requiring pressors, concern w/ hematoma forearm at cath site as well as abdomen, CT showed distraction injury T10, abdominal wall hematoma no concerns for internal hemorrhage. Neurosurgery consult and additional imaging.  11/15: T7-12 posterior fusion, Plt transfusion. Formal echo pending read.   Consultants:  Cardiology  Critical Care General Surgery  Neurosurgery   Procedures/Surgeries: 11/15: arterial line to monitor BP 11/15: Open Reduction and internal fixation of T10 fracture, Posterolateral arthrodesis T7 to T12      ASSESSMENT & PLAN:   STEMI S/p successful LCx stenting dual antiplatelet aspirin Plavix on hold given bleeding - see below  increased statin dosage cardiology recommend outpatient functional study for elective revascularization Echocardiogram pending read    Unstable thoracic spine fracture T10  Neurosurgery following S/p T7-12 posterior fusion today - monitoring postop Given bleeding  problems as below, cross 2 units Plt and give 1 prior to surgery   Syncope suspect MI associated ventricle arrhythmia vs vasovagal  Pressure too low to consider beta-blocking right now but is improving   Forearm Hematoma noted 11/14 post-cath at TR band site Band care protocol   LLQ Abdominal wall hematoma  serial CBCs every 6 hours  Holding heparin, ASA, plavix   Chest pain evening 11/14, subsequently bradycardic and hypotensive with heart rates into the 40s and systolic pressures in the 60s.  Concern for hypovolemia/bleeding, vasovagal urinary retention vs other, w/ newly found T10 fx possible cord syndrome  Bedside echo w/ Dr End 11/14 - LVEF appears grossly normal. RV grossly normal in size but not well-visualized. No significant pericardial effusion seen.  Pressors - norepi weaned off 11/14 ICU following    Urine retention Cath as needed / Foley   Thrombocytopenia  Bleeding/hematoma, high bleed risk  Monitor CBC Given bleeding problems as above, cross 2 units Plt and give 1 prior to surgery   Scalp laceration Surgical consult for stapling   Hx of PE/DVT and intracranial venous thrombosis On Eliquis prior to hospitalization  Holding anticoag for now given bleeding issues  Expect patient discharged home on Eliquis    AKI on CKD stage IIIa Likely component of contrast induced w/ catheterization  IV fluids, po as able  Monitor BMPEuvolemic, creatinine level stable   SLE Stable holding CellCept Continue steroid 10 mg daily   HTN Hold off home antihypertensives for now given hypotension      overweight based on BMI: Body mass index is 29.1 kg/m.  Underweight - under 18.5  normal weight - 18.5 to 24.9 overweight -  25 to 29.9 obese - 30 or more   DVT prophylaxis: SCD IV fluids: gentle continuous IV fluids  given low BP / bleeding concern Nutrition: regular diet  Central lines / invasive devices: none  Code Status: FULL CODE ACP documentation reviewed:   none on file in [facility]  TOC needs: TBD Barriers to dispo / significant pending items: see above             Subjective / Brief ROS:  Patient reports back pain bothering him otherwise just tired Denies CP/SOB.  Denies new weakness.  Tolerating diet.  Reports no concerns w/ urination/defecation.   Family Communication: none at thist ime     Objective Findings:  Vitals:   11/06/23 1455 11/06/23 1500 11/06/23 1545 11/06/23 1600  BP:  113/68 127/68 130/75  Pulse: 89 93 86 92  Resp: 15 12 11 14   Temp:    (!) 96.8 F (36 C)  TempSrc:    Axillary  SpO2: 96% 93% 95% 93%  Weight:      Height:        Intake/Output Summary (Last 24 hours) at 11/06/2023 1654 Last data filed at 11/06/2023 1430 Gross per 24 hour  Intake 3913.61 ml  Output 1150 ml  Net 2763.61 ml   Filed Weights   11/05/23 1016 11/05/23 1301  Weight: 107.9 kg 102.8 kg    Examination:  Physical Exam Constitutional:      General: He is not in acute distress.    Appearance: He is not ill-appearing.  Cardiovascular:     Rate and Rhythm: Normal rate and regular rhythm.     Heart sounds: Normal heart sounds.  Pulmonary:     Effort: Pulmonary effort is normal.  Abdominal:     General: Abdomen is flat.     Palpations: Abdomen is soft.  Musculoskeletal:     Right lower leg: No edema.     Left lower leg: No edema.  Skin:    General: Skin is warm and dry.  Neurological:     General: No focal deficit present.     Mental Status: He is alert and oriented to person, place, and time.  Psychiatric:        Mood and Affect: Mood normal.        Behavior: Behavior normal.          Scheduled Medications:   sodium chloride   Intravenous Once   acetaminophen  1,000 mg Oral Q6H   ARIPiprazole  2 mg Oral Daily   aspirin  324 mg Oral Once   aspirin  81 mg Oral Daily   Chlorhexidine Gluconate Cloth  6 each Topical Daily   docusate sodium  100 mg Oral BID   gabapentin  300 mg Oral TID   hydrOXYzine   25 mg Oral BID   loratadine  5 mg Oral QPM   montelukast  10 mg Oral QHS   rosuvastatin  20 mg Oral Daily   senna  1 tablet Oral BID   sertraline  100 mg Oral Daily   sodium chloride flush  3 mL Intravenous Q12H    Continuous Infusions:  sodium chloride 75 mL/hr at 11/06/23 0446   norepinephrine (LEVOPHED) Adult infusion Stopped (11/05/23 1815)   promethazine (PHENERGAN) injection (IM or IVPB) Stopped (11/05/23 2050)    PRN Medications:  acetaminophen, albuterol, HYDROmorphone (DILAUDID) injection, LORazepam, magnesium citrate, methocarbamol **OR** methocarbamol (ROBAXIN) injection, ondansetron (ZOFRAN) IV, mouth rinse, oxyCODONE, oxyCODONE, polyethylene glycol, sodium chloride flush, sorbitol  Antimicrobials from admission:  Anti-infectives (From admission, onward)    None           Data Reviewed:  I have personally reviewed the following...  CBC: Recent Labs  Lab 11/05/23 1015 11/05/23 1757 11/06/23 0053 11/06/23 0425  WBC 8.0 8.8  --  7.6  NEUTROABS 6.8 7.2  --   --   HGB 16.8 14.0 13.0 13.0  HCT 52.9* 42.9 40.8 40.3  MCV 93.1 90.5  --  90.6  PLT 149* 111*  --  110*   Basic Metabolic Panel: Recent Labs  Lab 11/05/23 1015 11/05/23 1840 11/06/23 0425  NA 140 137 136  K 3.9 4.0 4.4  CL 105 109 105  CO2 25 22 22   GLUCOSE 119* 96 110*  BUN 24* 24* 28*  CREATININE 1.64* 1.50* 1.78*  CALCIUM 9.2 7.9* 8.0*  MG 2.1  --  2.0  PHOS 2.6  --  3.8   GFR: Estimated Creatinine Clearance: 55.7 mL/min (A) (by C-G formula based on SCr of 1.78 mg/dL (H)). Liver Function Tests: Recent Labs  Lab 11/05/23 1015 11/05/23 1840  AST 21 17  ALT 19 17  ALKPHOS 73 57  BILITOT 1.2* 1.2*  PROT 7.9 5.9*  ALBUMIN 4.4 3.4*   Recent Labs  Lab 11/05/23 1840  LIPASE 34   No results for input(s): "AMMONIA" in the last 168 hours. Coagulation Profile: Recent Labs  Lab 11/05/23 1015  INR 1.2   Cardiac Enzymes: No results for input(s): "CKTOTAL", "CKMB",  "CKMBINDEX", "TROPONINI" in the last 168 hours. BNP (last 3 results) No results for input(s): "PROBNP" in the last 8760 hours. HbA1C: No results for input(s): "HGBA1C" in the last 72 hours. CBG: Recent Labs  Lab 11/05/23 1256  GLUCAP 93   Lipid Profile: Recent Labs    11/05/23 1012  CHOL 199  HDL 59  LDLCALC 119*  TRIG 105  CHOLHDL 3.4   Thyroid Function Tests: No results for input(s): "TSH", "T4TOTAL", "FREET4", "T3FREE", "THYROIDAB" in the last 72 hours. Anemia Panel: No results for input(s): "VITAMINB12", "FOLATE", "FERRITIN", "TIBC", "IRON", "RETICCTPCT" in the last 72 hours. Most Recent Urinalysis On File:     Component Value Date/Time   COLORURINE YELLOW 09/13/2021 1010   APPEARANCEUR Clear 03/25/2023 0833   LABSPEC 1.025 09/13/2021 1010   LABSPEC 1.013 04/10/2015 1036   PHURINE 5.5 09/13/2021 1010   GLUCOSEU Negative 03/25/2023 0833   GLUCOSEU Negative 04/10/2015 1036   HGBUR SMALL (A) 09/13/2021 1010   BILIRUBINUR Negative 03/25/2023 0833   BILIRUBINUR Negative 04/10/2015 1036   KETONESUR NEGATIVE 09/13/2021 1010   PROTEINUR 1+ (A) 03/25/2023 0833   PROTEINUR >300 (A) 09/13/2021 1010   UROBILINOGEN 0.2 07/23/2021 1423   UROBILINOGEN 0.2 03/17/2015 0025   NITRITE Negative 03/25/2023 0833   NITRITE NEGATIVE 09/13/2021 1010   LEUKOCYTESUR Negative 03/25/2023 0833   LEUKOCYTESUR NEGATIVE 09/13/2021 1010   LEUKOCYTESUR Negative 04/10/2015 1036   Sepsis Labs: @LABRCNTIP (procalcitonin:4,lacticidven:4) Microbiology: Recent Results (from the past 240 hour(s))  MRSA Next Gen by PCR, Nasal     Status: None   Collection Time: 11/05/23  1:12 PM   Specimen: Nasal Mucosa; Nasal Swab  Result Value Ref Range Status   MRSA by PCR Next Gen NOT DETECTED NOT DETECTED Final    Comment: (NOTE) The GeneXpert MRSA Assay (FDA approved for NASAL specimens only), is one component of a comprehensive MRSA colonization surveillance program. It is not intended to diagnose MRSA  infection nor to guide or monitor treatment for MRSA infections. Test performance is not  FDA approved in patients less than 77 years old. Performed at [facility], [address] [place], [name] [address]       Radiology Studies last 3 days: DG Thoracic Spine 2 View  Result Date: 11/06/2023 CLINICAL DATA:  Elective surgery. EXAM: THORACIC SPINE 2 VIEWS COMPARISON:  Thoracic spine MRI yesterday FINDINGS: Two fluoroscopic spot views of the thoracic spine as well as intraoperative CT imaging obtained during multilevel thoracic spine fusion. Fluoroscopy time 1 minute 6 seconds. Dose 85.785 mGy. IMPRESSION: Intraoperative fluoroscopy during thoracic spine fusion. Electronically Signed   By: Narda Rutherford M.D.   On: 11/06/2023 16:30   DG C-Arm 1-60 Min-No Report  Result Date: 11/06/2023 Fluoroscopy was utilized by the requesting physician.  No radiographic interpretation.   MR THORACIC SPINE WO CONTRAST  Result Date: 11/06/2023 CLINICAL DATA:  Initial evaluation for acute mid back pain. EXAM: MRI THORACIC SPINE WITHOUT CONTRAST TECHNIQUE: Multiplanar, multisequence MR imaging of the thoracic spine was performed. No intravenous contrast was administered. COMPARISON:  Prior CT from earlier the same day. FINDINGS: Alignment: Physiologic with preservation of the normal thoracic kyphosis. No listhesis. Vertebrae: Acute transverse fracture extending through the T10 vertebral body again seen. Evidence for extension through the posterior elements on the right (series 19, image 6). Findings consistent with an acute Chance type fracture, and unstable injury by definition. No significant vertebral body height loss. Probable injury/disruption of the anterior longitudinal ligament at this level. Remainder of the major ligamentous structures appear grossly intact. No other acute or recent fracture within the thoracic spine. Mild chronic compression deformity of L1 noted. Bone marrow signal  intensity diffusely heterogeneous but overall within normal limits. Few scattered benign hemangiomata noted. No worrisome osseous lesions. No other abnormal marrow edema. Cord: Normal signal and morphology. No evidence for traumatic cord injury. Paraspinal and other soft tissues: Paraspinous soft tissues demonstrate no acute finding. Small layering bilateral pleural effusions. Complex cystic lesion within the partially visualized right hepatic lobe noted, better seen on prior CT. 1.6 cm simple right renal cyst, benign in appearance, no follow-up imaging recommended. Disc levels: Ordinary for age multilevel disc desiccation seen throughout the thoracic spine. No significant disc bulge or focal disc herniation. Mild multilevel facet degeneration. No spinal stenosis. Neural foramina remain patent. IMPRESSION: 1. Acute transverse fracture extending through the T10 vertebral body, with extension through the posterior elements on the right. Finding consistent with an acute "Chance" type fracture, an unstable injury by definition. No significant vertebral body height loss. Probable injury/disruption of the anterior longitudinal ligament at this level. 2. No other acute traumatic injury within the thoracic spine or spinal cord. 3. Small layering bilateral pleural effusions. Electronically Signed   By: Rise Mu M.D.   On: 11/06/2023 00:57   CT ANGIO CHEST AORTA W/CM &/OR WO/CM  Result Date: 11/06/2023 CLINICAL DATA:  Chest trauma, posterior mediastinal stranding on CT abdomen/pelvis EXAM: CT ANGIOGRAPHY CHEST WITH CONTRAST TECHNIQUE: Multidetector CT imaging of the chest was performed using the standard protocol during bolus administration of intravenous contrast. Multiplanar CT image reconstructions and MIPs were obtained to evaluate the vascular anatomy. RADIATION DOSE REDUCTION: This exam was performed according to the departmental dose-optimization program which includes automated exposure control,  adjustment of the mA and/or kV according to patient size and/or use of iterative reconstruction technique. CONTRAST:  OMNIPAQUE IOHEXOL 350 MG/ML SOLN COMPARISON:  MRI thoracic spine dated 11/05/2023. CT abdomen/pelvis dated 11142 than 24. FINDINGS: Cardiovascular: On unenhanced CT, there is no evidence of  intramural hematoma. Following contrast administration, there is no evidence of traumatic aortic injury. Mild atherosclerotic calcifications of the aortic arch. Although not tailored for evaluation of the pulmonary arteries, there is no evidence of central pulmonary embolism to the lobar level. The heart is normal in size.  No pericardial effusion. Moderate three-vessel coronary atherosclerosis. Mediastinum/Nodes: No evidence of anterior mediastinal hematoma. No suspicious mediastinal lymphadenopathy. Mild prevertebral stranding/hemorrhage at T10 (series 4/image 46), related to the vertebral body fracture at this level, better evaluated on MR. Visualized thyroid is unremarkable. Lungs/Pleura: Mild dependent atelectasis in the bilateral lower lobes. No focal consolidation or aspiration. No suspicious pulmonary nodules. No pleural effusion or pneumothorax. Upper Abdomen: Evaluated on recent CT abdomen/pelvis. Musculoskeletal: Dedicated thoracic spine evaluation (with T10 fracture) is better evaluated on recent MR thoracic spine. Otherwise, no fracture is seen. Review of the MIP images confirms the above findings. IMPRESSION: T10 vertebral body fracture, better evaluated on recent MRI thoracic spine. Associated mild prevertebral stranding/hemorrhage at this level, corresponding to the finding on recent CT abdomen/pelvis. No evidence of traumatic aortic injury. Aortic Atherosclerosis (ICD10-I70.0). Electronically Signed   By: Charline Bills M.D.   On: 11/06/2023 00:55   CT ABDOMEN PELVIS WO CONTRAST  Result Date: 11/05/2023 CLINICAL DATA:  Abdominal trauma, blunt r/o hematoma EXAM: CT ABDOMEN AND PELVIS  WITHOUT CONTRAST TECHNIQUE: Multidetector CT imaging of the abdomen and pelvis was performed following the standard protocol without IV contrast. RADIATION DOSE REDUCTION: This exam was performed according to the departmental dose-optimization program which includes automated exposure control, adjustment of the mA and/or kV according to patient size and/or use of iterative reconstruction technique. COMPARISON:  CT abdomen pelvis 03/17/2018, MRI 09/30/2023 FINDINGS: Lower chest: Stranding of the visualized posterior distal mediastinum. Coronary calcification. Mitral annular calcification. Hepatobiliary: Chronic 9.4 x 8.2 cm right posterior hepatic lobe fluid dense lesion with thin septations and associated calcification. Possible vicarious excretion of contrast via the gallbladder. No gallstones, gallbladder wall thickening, or pericholecystic fluid. No biliary dilatation. Pancreas: No focal lesion. Normal pancreatic contour. No surrounding inflammatory changes. No main pancreatic ductal dilatation. Spleen: Normal in size without focal abnormality. Adrenals/Urinary Tract: No adrenal nodule bilaterally. No nephrolithiasis and no hydronephrosis. No definite contour-deforming renal mass. No ureterolithiasis or hydroureter. Renal excretion of previously administered intravenous contrast. The urinary bladder is unremarkable. Stomach/Bowel: Stomach is within normal limits. No evidence of bowel wall thickening or dilatation. Colonic diverticulosis. Appendix appears normal. Vascular/Lymphatic: No abdominal aorta or iliac aneurysm. Mild atherosclerotic plaque of the aorta and its branches. No abdominal, pelvic, or inguinal lymphadenopathy. Reproductive: The prostate is enlarged measuring up to 4.9 cm. Other: No intraperitoneal free fluid. No intraperitoneal free gas. No organized fluid collection. Musculoskeletal: Left lower anterior abdominal wall subcutaneus soft tissue edema and hematoma formation. Associated subcutaneus  soft tissue emphysema. No suspicious lytic or blastic osseous lesions. Acute transverse fracture through the T10 anterior wall and contiguous osteophytes (5:69, 6:68). Query similar finding along the T11 inferior endplate (6:68). Surgical hardware fixation of the left proximal femur. Left L5-S1 posterolateral and anterior surgical hardware. Chronic L4 superior endplate compression fracture status post kyphoplasty. IMPRESSION: 1. Fat stranding of the visualized posterior distal mediastinum. Finding likely related to thoracic spine fracture below. Recommend CT chest with intravenous contrast for further evaluation. Differential diagnosis includes inflammation, aortic injury, esophageal injury. 2. Distraction injury of the thoracic spine. Recommend thoracolumbar MRI for further evaluation of possible underlying ligamentous injury. Acute transverse fracture through the T10 vertebral body anterior wall and contiguous osteophytes. Query similar  finding along the T11 inferior endplate. Colonic diverticulosis with no acute diverticulitis. 3. Left lower anterior abdominal wall subcutaneus soft tissue edema and hematoma formation. Associated subcutaneus soft tissue emphysema. No retained radiopaque foreign body. 4. Prostatomegaly. 5.  Aortic Atherosclerosis (ICD10-I70.0). These results will be called to the ordering clinician or representative by the Radiologist Assistant, and communication documented in the PACS or [employer]. Electronically Signed   By: Tish Frederickson M.D.   On: 11/05/2023 22:20   CARDIAC CATHETERIZATION  Result Date: 11/05/2023 Conclusions: Severe multivessel coronary artery disease, as detailed above.  Culprit lesion for inferior STEMI appears to be hazy 80-90% distal LCx stenosis.  There is also occlusion of the proximal RCA with bridging and left-to-right collaterals that could not be crossed with a wire suggesting chronic nature.  Moderate to severe but noncritical proximal/mid LAD and  proximal ramus disease also present. Normal left ventricular filling pressure (LVEDP 14 mmHg). Successful IVUS-guided PCI to hazy 80-90% distal LCx stenosis using Onyx Frontier 3.0 x 30 mm drug-eluting stent with 0% residual stenosis and TIMI-3 flow.  First medical contact to balloon time was delayed because of the patient's syncope and head trauma necessitating CT head and cervical spine in the ER prior to transport to the cath lab as well as multiple potential culprit lesions. Recommendations: Admit to ICU for post STEMI monitoring. Begin heparin infusion when next dose of apixaban would be due (approximately 8 PM).  Transition to apixaban as soon as tomorrow if there is no evidence of bleeding or vascular complication. Anticipate triple therapy with aspirin, clopidogrel, and apixaban for 1 month after which time aspirin can be discontinued and clopidogrel and apixaban continued for 12 months. Aggressive secondary prevention of coronary disease, including escalation of rosuvastatin. Consider functional study when patient has recovered from this hospitalization to assess hemodynamic significance of LAD and ramus intermedius disease. Gentle postcatheterization hydration in the setting of chronic kidney disease. Yvonne Kendall, MD [facility]  CT Head Wo Contrast  Result Date: 11/05/2023 CLINICAL DATA:  Head trauma, coagulopathy (Age 23-64y); Neck trauma, midline tenderness (Age 55-64y) EXAM: CT HEAD WITHOUT CONTRAST CT CERVICAL SPINE WITHOUT CONTRAST TECHNIQUE: Multidetector CT imaging of the head and cervical spine was performed following the standard protocol without intravenous contrast. Multiplanar CT image reconstructions of the cervical spine were also generated. RADIATION DOSE REDUCTION: This exam was performed according to the departmental dose-optimization program which includes automated exposure control, adjustment of the mA and/or kV according to patient size and/or use of iterative  reconstruction technique. COMPARISON:  None Available. FINDINGS: CT HEAD FINDINGS Brain: No evidence of acute infarction, hemorrhage, hydrocephalus, extra-axial collection or mass lesion/mass effect. Vascular: No hyperdense vessel. Skull: No acute fracture. Sinuses/Orbits: Clear sinuses.  No acute orbital findings. Other: No mastoid effusions. Heterogeneous appearance of the parotid glands with many small calcifications. CT CERVICAL SPINE FINDINGS Alignment: No substantial sagittal subluxation. Skull base and vertebrae: Mild height loss of upper thoracic vertebral bodies appears similar to CT of the chest from Apr 27, 2017. No evidence of acute fracture. Soft tissues and spinal canal: No prevertebral fluid or swelling. No visible canal hematoma. Disc levels:  Moderate multilevel degenerative change. Upper chest: Visualized lung apices are clear. IMPRESSION: 1. No evidence of acute abnormality intracranially or in the cervical spine. 2. Heterogeneous appearance of the parotid glands with many small calcifications, which suggests chronic inflammation (such as with Sjogren's). Electronically Signed   By: Feliberto Harts M.D.   On: 11/05/2023 10:43   CT Cervical  Spine Wo Contrast  Result Date: 11/05/2023 CLINICAL DATA:  Head trauma, coagulopathy (Age 39-64y); Neck trauma, midline tenderness (Age 3-64y) EXAM: CT HEAD WITHOUT CONTRAST CT CERVICAL SPINE WITHOUT CONTRAST TECHNIQUE: Multidetector CT imaging of the head and cervical spine was performed following the standard protocol without intravenous contrast. Multiplanar CT image reconstructions of the cervical spine were also generated. RADIATION DOSE REDUCTION: This exam was performed according to the departmental dose-optimization program which includes automated exposure control, adjustment of the mA and/or kV according to patient size and/or use of iterative reconstruction technique. COMPARISON:  None Available. FINDINGS: CT HEAD FINDINGS Brain: No evidence of  acute infarction, hemorrhage, hydrocephalus, extra-axial collection or mass lesion/mass effect. Vascular: No hyperdense vessel. Skull: No acute fracture. Sinuses/Orbits: Clear sinuses.  No acute orbital findings. Other: No mastoid effusions. Heterogeneous appearance of the parotid glands with many small calcifications. CT CERVICAL SPINE FINDINGS Alignment: No substantial sagittal subluxation. Skull base and vertebrae: Mild height loss of upper thoracic vertebral bodies appears similar to CT of the chest from Apr 27, 2017. No evidence of acute fracture. Soft tissues and spinal canal: No prevertebral fluid or swelling. No visible canal hematoma. Disc levels:  Moderate multilevel degenerative change. Upper chest: Visualized lung apices are clear. IMPRESSION: 1. No evidence of acute abnormality intracranially or in the cervical spine. 2. Heterogeneous appearance of the parotid glands with many small calcifications, which suggests chronic inflammation (such as with Sjogren's). Electronically Signed   By: Feliberto Harts M.D.   On: 11/05/2023 10:43       Time spent: 50 min     Sunnie Nielsen, DO [facility] 11/06/2023, 4:54 PM    Dictation software may have been used to generate the above note. Typos may occur and escape review in typed/dictated notes. Please contact Dr Lyn Hollingshead directly for clarity if needed.  Staff may message me via secure chat in Epic  but this may not receive an immediate response,  please page me for urgent matters!  If 7PM-7AM, please contact night coverage [email or web address]

## 2023-11-06 NOTE — Anesthesia Procedure Notes (Signed)
Procedure Name: Intubation Date/Time: 11/06/2023 10:09 AM  Performed by: Henrietta Hoover, CRNAPre-anesthesia Checklist: Patient identified, Emergency Drugs available, Suction available and Patient being monitored Patient Re-evaluated:Patient Re-evaluated prior to induction Oxygen Delivery Method: Circle system utilized Preoxygenation: Pre-oxygenation with 100% oxygen Induction Type: IV induction and Rapid sequence Laryngoscope Size: 3 and McGrath Grade View: Grade I Tube type: Oral Tube size: 7.5 mm Number of attempts: 1 Airway Equipment and Method: Stylet and Video-laryngoscopy Placement Confirmation: ETT inserted through vocal cords under direct vision, positive ETCO2 and breath sounds checked- equal and bilateral Secured at: 23 cm Tube secured with: Tape Dental Injury: Teeth and Oropharynx as per pre-operative assessment  Comments: Pt in hosp bed when intubating.

## 2023-11-06 NOTE — Anesthesia Preprocedure Evaluation (Addendum)
Anesthesia Evaluation  Patient identified by MRN, date of birth, ID band Patient awake    Reviewed: Allergy & Precautions, NPO status , Patient's Chart, lab work & pertinent test results  History of Anesthesia Complications (+) PONV and history of anesthetic complications  Airway Mallampati: III  TM Distance: <3 FB Neck ROM: full    Dental  (+) Chipped   Pulmonary shortness of breath and at rest, COPD   Pulmonary exam normal        Cardiovascular Exercise Tolerance: Good hypertension, (-) angina + CAD, + Past MI and + Peripheral Vascular Disease  Normal cardiovascular exam Rhythm:regular Rate:Normal     Neuro/Psych  PSYCHIATRIC DISORDERS       Neuromuscular disease    GI/Hepatic Neg liver ROS,GERD  Controlled,,  Endo/Other  negative endocrine ROS    Renal/GU      Musculoskeletal   Abdominal   Peds  Hematology negative hematology ROS (+)   Anesthesia Other Findings Patient with a significant PMH of STEMI with stent placement yesterday 11/14. Patient seen in ICU and states he feels short of breath but it is normal for him with his COPD. Denies any home oxygen. Patient saturating well on room air. Denies chest pain this morning. EKG changes noted in inferior leads on EKG. Per Cardiology's note today 11/15:  Patient was found to have a Chance fracture at T10 on CT of the abdomen and pelvis yesterday evening.  He was evaluated by Dr. Marcell Barlow of neurosurgery, who has recommended urgent surgical stabilization due to concern for this being a highly unstable fracture that could lead to paralysis.  Given that Mr. Fooshee had been on apixaban and received aspirin and clopidogrel as part of his PCI yesterday, he has increased risk for bleeding.  Given high risk nature of the patient's fracture, I recommend proceeding with neurosurgical intervention per Dr. Osborne Oman recommendations.   Patient to receive platelets before the  beginning of the surgery. Patient seen and ICU and discussed with the RN in the room the need to start platelets. The platelets are coming from [place]. We will proceed with the procedure as the case is extreme urgency per the surgeon. Discussed the case in detail with the patient and he is aware that he is at elevated risk of problems under anesthesia.   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     Comment:  Overview:  superior sagittal sinus, left transverse               sinus and cortical veins  12/2020: COVID-19 virus infection No date: Depression No date: DVT (deep venous thrombosis) (HCC) No date: Dyspnea No date: GERD (gastroesophageal reflux disease) 02/16/2019: Heel spur, left 02/16/2019: Heel spur, right 02/20/2020: Herpes zoster infection of lumbar region No date: History of kidney stones No date: Hyperlipidemia No date: Hypertension No date: Lupus (HCC) 10/07/2018: Lymphedema No date: Morbid obesity (HCC) 02/26/2018: Opiate abuse, episodic (HCC) No date: Osteoporosis No date: Pneumonia No date: PONV (postoperative nausea and vomiting) 11/18/2016: Postphlebitic syndrome with ulcer, left (HCC) 03/22/2020: Presence of IVC filter     Comment:  Removed No date: Pulmonary embolism (HCC) No date: Renal disorder     Comment:  Stage III  Past Surgical History: No date: ANKLE SURGERY; Right No date: BACK SURGERY 11/01/2021: BRONCHIAL WASHINGS; N/A     Comment:  Procedure: BRONCHIAL WASHINGS;  Surgeon: Vida Rigger, MD;  Location: [facility] ORS;  Service: Thoracic;                Laterality: N/A; 05/28/2020: COLONOSCOPY WITH PROPOFOL; N/A     Comment:  Procedure: COLONOSCOPY WITH PROPOFOL;  Surgeon: Wyline Mood, MD;  Location: [facility] ENDOSCOPY;  Service:               Endoscopy;  Laterality: N/A; 92 or 93 : CYST EXCISION     Comment:  Liver cyst removal [facility] 11/01/2021: FLEXIBLE  BRONCHOSCOPY; N/A     Comment:  Procedure: FLEXIBLE BRONCHOSCOPY;  Surgeon: Vida Rigger, MD;  Location: [facility] ORS;  Service: Thoracic;                Laterality: N/A; 02/19/2023: HIP PINNING,CANNULATED; Left     Comment:  Procedure: PERCUTANEOUS FIXATION OF FEMORAL NECK;                Surgeon: Signa Kell, MD;  Location: [facility] ORS;  Service:              Orthopedics;  Laterality: Left; 04/29/2017: I & D EXTREMITY; Right     Comment:  Procedure: IRRIGATION AND DEBRIDEMENT EXTREMITY;                Surgeon: Ricarda Frame, MD;  Location: [facility] ORS;                Service: General;  Laterality: Right; 04/29/2017: IRRIGATION AND DEBRIDEMENT ABSCESS; Left     Comment:  Procedure: IRRIGATION AND DEBRIDEMENT Scrotal ABSCESS;                Surgeon: Ricarda Frame, MD;  Location: [facility] ORS;                Service: General;  Laterality: Left;  BMI    Body Mass Index: 30.36 kg/m      Reproductive/Obstetrics negative OB ROS                             Anesthesia Physical Anesthesia Plan  ASA: 4 and emergent  Anesthesia Plan: General   Post-op Pain Management:    Induction: Intravenous  PONV Risk Score and Plan: 2 and Ondansetron, Dexamethasone and Midazolam  Airway Management Planned: Oral ETT  Additional Equipment: Arterial line  Intra-op Plan:   Post-operative Plan: Extubation in OR  Informed Consent: I have reviewed the patients History and Physical, chart, labs and discussed the procedure including the risks, benefits and alternatives for the proposed anesthesia with the patient or authorized representative who has indicated his/her understanding and acceptance.     Dental Advisory Given  Plan Discussed with: Anesthesiologist, CRNA and Surgeon  Anesthesia Plan Comments: (Patient consented for risks of anesthesia including but not limited to:  - adverse reactions to medications - damage to eyes, teeth, lips or other oral  mucosa - nerve damage due to positioning  - sore throat or hoarseness - Damage to heart, brain, nerves, lungs, other parts of body or loss of life  Discussed with patient that he is at a high risk of problems under anesthesia including heart attacks, strokes, and even possibly death. Patient voiced understanding and assent.)       Anesthesia Quick Evaluation

## 2023-11-06 NOTE — Consult Note (Addendum)
Consult requested by:  Cheryll Cockayne  Consult requested for:  Unstable T10 fracture  Primary Physician:  Danelle Berry, PA-C  History of Present Illness: 11/06/2023 Mr. Tyjuan Kostek is here today with a chief complaint of fall of unclear etiology yesterday.  He presented to the hospital where he had severe chest pain and was a STEMI activation.  He went to the cardiac lab where he had a stent placed in his left circumflex artery.  He was loaded with Plavix and was on Eliquis until yesterday.  He is currently not on Eliquis and has not started aspirin and Plavix yet orally.  He is also on CellCept for his lupus.  He has severe chest pain.  He denies any neurologic symptoms otherwise.  He has not mobilized since his procedure.  I was called late last evening when an unstable fracture was identified. Review of Systems:  A 10 point review of systems is negative, except for the pertinent positives and negatives detailed in the HPI.  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   Overview:  superior sagittal sinus, left transverse sinus and cortical veins    COVID-19 virus infection 12/2020   Depression    DVT (deep venous thrombosis) (HCC)    Dyspnea    GERD (gastroesophageal reflux disease)    Heel spur, left 02/16/2019   Heel spur, right 02/16/2019   Herpes zoster infection of lumbar region 02/20/2020   History of kidney stones    Hyperlipidemia    Hypertension    Lupus    Lymphedema 10/07/2018   Morbid obesity (HCC)    Opiate abuse, episodic (HCC) 02/26/2018   Osteoporosis    Pneumonia    PONV (postoperative nausea and vomiting)    Postphlebitic syndrome with ulcer, 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   Presence of IVC filter 03/22/2020   Removed   Pulmonary embolism (HCC)    Renal disorder    Stage III    Past Surgical History: Past Surgical History:  Procedure Laterality Date    ANKLE SURGERY Right    BACK SURGERY     BRONCHIAL WASHINGS N/A 11/01/2021   Procedure: BRONCHIAL WASHINGS;  Surgeon: Vida Rigger, MD;  Location: [facility] ORS;  Service: Thoracic;  Laterality: N/A;   COLONOSCOPY WITH PROPOFOL N/A 05/28/2020   Procedure: COLONOSCOPY WITH PROPOFOL;  Surgeon: Wyline Mood, MD;  Location: [facility] ENDOSCOPY;  Service: Endoscopy;  Laterality: N/A;   COLONOSCOPY WITH PROPOFOL N/A 07/01/2023   Procedure: COLONOSCOPY WITH PROPOFOL;  Surgeon: Wyline Mood, MD;  Location: [facility] ENDOSCOPY;  Service: Gastroenterology;  Laterality: N/A;   CYST EXCISION  92 or 93    Liver cyst removal [facility]   FLEXIBLE BRONCHOSCOPY N/A 11/01/2021   Procedure: FLEXIBLE BRONCHOSCOPY;  Surgeon: Vida Rigger, MD;  Location: [facility] ORS;  Service: Thoracic;  Laterality: N/A;   HIP PINNING,CANNULATED Left 02/19/2023   Procedure: PERCUTANEOUS FIXATION OF FEMORAL NECK;  Surgeon: Signa Kell, MD;  Location: [facility] ORS;  Service: Orthopedics;  Laterality: Left;   I & D EXTREMITY Right 04/29/2017   Procedure: IRRIGATION AND DEBRIDEMENT EXTREMITY;  Surgeon: Ricarda Frame, MD;  Location: [facility] ORS;  Service: General;  Laterality: Right;   IRRIGATION AND DEBRIDEMENT ABSCESS Left 04/29/2017   Procedure: IRRIGATION AND DEBRIDEMENT Scrotal ABSCESS;  Surgeon: Ricarda Frame, MD;  Location: [facility] ORS;  Service: General;  Laterality: Left;   POLYPECTOMY  07/01/2023   Procedure: POLYPECTOMY;  Surgeon: Wyline Mood, MD;  Location: [facility] ENDOSCOPY;  Service: Gastroenterology;;    Allergies: Allergies as of 11/05/2023 - Review Complete 11/05/2023  Allergen Reaction Noted   Enalapril Other (See Comments) 07/31/2015   Vicodin [hydrocodone-acetaminophen] Hives and Rash 12/09/2013    Medications: No outpatient medications have been marked as taking for the 11/05/23 encounter [facility] Encounter).    Social History: Social History   Tobacco Use   Smoking status: Never   Smokeless tobacco: Never  Vaping Use   Vaping  status: Never Used  Substance Use Topics   Alcohol use: No   Drug use: No    Comment: PT DENIES    Family Medical History: Family History  Problem Relation Age of Onset   Hypertension Father    Heart disease Father    Clotting disorder Mother    Kidney disease Brother    Heart attack Maternal Grandmother    Heart attack Maternal Grandfather    Heart attack Paternal Grandfather     Physical Examination: Vitals:   11/06/23 0645 11/06/23 0700  BP: 122/78 122/80  Pulse: 81 89  Resp: 15 19  Temp:    SpO2: 95% 96%    General: Patient is in no apparent distress. Attention to examination is appropriate.  Neck:   Supple.  Full range of motion.  Respiratory: Patient is breathing without any difficulty.   NEUROLOGICAL:     Awake, alert, oriented to person, place, and time.  Speech is clear and fluent.  Cranial Nerves: Pupils equal round and reactive to light.  Facial tone is symmetric.  Facial sensation is symmetric. Shoulder shrug is symmetric. Tongue protrusion is midline.   Strength: Side Biceps Triceps Deltoid Interossei Grip Wrist Ext. Wrist Flex.  R 5 5 5 5 5 5 5   L 5 5 5 5 5 5 5    Side Iliopsoas Quads Hamstring PF DF EHL  R 5 5 5 5 5 5   L 5 5 5 5 5 5     Bilateral upper and lower extremity sensation is intact to light touch.    No evidence of dysmetria noted.  Gait is untested.     Medical Decision Making  Imaging: MRI T spine 11/05/2023 IMPRESSION: 1. Acute transverse fracture extending through the T10 vertebral body, with extension through the posterior elements on the right. Finding consistent with an acute "Chance" type fracture, an unstable injury by definition. No significant vertebral body height loss. Probable injury/disruption of the anterior longitudinal ligament at this level. 2. No other acute traumatic injury within the thoracic spine or spinal cord. 3. Small layering bilateral pleural effusions.     Electronically Signed   By:  Rise Mu M.D.   On: 11/06/2023 00:57  CT T spine 11/05/2023 IMPRESSION: T10 vertebral body fracture, better evaluated on recent MRI thoracic spine.   Associated mild prevertebral stranding/hemorrhage at this level, corresponding to the finding on recent CT abdomen/pelvis.   No evidence of traumatic aortic injury.   Aortic Atherosclerosis (ICD10-I70.0).     Electronically Signed   By: Charline Bills M.D.   On: 11/06/2023 00:55    I have personally reviewed the images and agree with the above interpretation.  Assessment and Plan: Mr. Wienckowski is a pleasant 62 y.o. male with an unstable 3 column fracture of the thoracic spine at T10 consistent with a Chance fracture.  He has an anterior longitudinal ligament injury in addition to anterior, middle, and posterior column injuries.  He is at extremely high risk of spinal cord injury with mobilization or  falls.   Unfortunate, there are multiple complicating matters.  He was loaded on Plavix yesterday for stenting procedure.  Fortunately, it looks as though he has not had a major cardiac event associated with his presentation.  However, he will be at risk of in-stent thrombosis as he will need to have fresh platelets administered to allow for surgical fixation of this fracture.  Additionally, he is on CellCept for his lupus.  We will hold that for at least a few days to allow for surgical site healing.  He was on Eliquis until prior to presentation.  His INR is now normalized.  We will cross 2 units of platelets and give 1 now.  Have also asked the ICU to place an arterial line to monitor his blood pressure.  Based on the level of fracture, I have recommended T7-12 fixation to allow for operative fixation of his T10 fracture and fusion of the thoracic spine.  I discussed the planned procedure at length with the patient, including the risks, benefits, alternatives, and indications. The risks discussed include but are not limited  to bleeding, infection, need for reoperation, spinal fluid leak, stroke, vision loss, anesthetic complication, coma, paralysis, and even death. I also described in detail that improvement was not guaranteed.  The patient expressed understanding of these risks, and asked that we proceed with surgery. I described the surgery in layman's terms, and gave ample opportunity for questions, which were answered to the best of my ability.  I have discussed with him the complicating matters of his condition.  Because of the extreme inherent instability of this type of fracture, I do not think there is benefit to waiting for surgical fixation.  He is at significant risk of spinal cord injury with any falls.  I have spoken with Dr. Okey Dupre, and we will delay restarting Plavix for a few days.  I will contact Dr. Lourdes Sledge as well, who manages his medications for his lupus and kidneys.  I have communicated my recommendations to the requesting physician and coordinated care to facilitate these recommendations.     [place]attle Dalporto K. Myer Haff MD, [facility] Neurosurgery

## 2023-11-06 NOTE — Op Note (Addendum)
Indications: [name] is suffering from unstable 3 column T10 fracture. Due to instability, surgery was recommended  Findings: successful orif  Preoperative Diagnosis: thoracic instability, T10 fracture, unstable three column thoracic fracture Postoperative Diagnosis: same   EBL: 250 ml IVF: See anesthesia record Drains: none placed Disposition: Extubated and Stable to PACU Complications: none  Preoperative Note:   Risks of surgery discussed include: infection, bleeding, stroke, coma, death, paralysis, CSF leak, nerve/spinal cord injury, numbness, tingling, weakness, complex regional pain syndrome, recurrent stenosis and/or disc herniation, vascular injury, development of instability, neck/back pain, need for further surgery, persistent symptoms, development of deformity, and the risks of anesthesia. The patient understood these risks and agreed to proceed.  Operative Note:  1. Open Reduction and internal fixation of T10 fracture 2. Posterolateral arthrodesis T7 to T12 3. Use of stereotaxis ([employer])  The patient was brought to the Operating Room, intubated and turned into the prone position. All pressure points were checked and double checked. The patient was prepped and draped in the standard fashion. A full timeout was performed. Preoperative antibiotics were given.   After draping, the stereotactic array was placed.  Stereotactic imaging was acquired and registered to the [employer] system.  We then used the stereotactic system to choose appropriate incisions for placement of posterior fixation.  The incisions were made and the fascia divided.  We then utilized the stereotactic drill guide to cannulate the pedicles bilaterally from T7-T12.    We advanced NuVasive reline screws at each level.  At T7, T8, T11, and T12, fenestrated screws were used for cement augmentation.  At T9 and T10, regular cannulated screws were utilized.  After all screws were placed, the imaging system  was used to confirm placement of all screws.  Monitoring was then used to confirm no changes.  We then used the cement augmentation system to cement augment the top 2 and bottom 2 screws on each side.  We then shaped a rod and advanced this through the towers at each level.  The rod was secured using locking caps to manufacturer specifications.  By reducing the screws to the rod, the T10 fracture was openly reduced and internally fixated.    The operative site was irrigated, then bone graft was placed between T7 and T12 after preparation of the posterolateral elements for arthrodesis.    We then focused on closure.  After hemostasis was achieved.  The wound was closed in layers using 0 and 2-0 Vicryl.  We used staples on the skin.  Sterile dressings were placed.  The patient was then flipped supine and extubated with incident. All counts were correct times 2 at the end of the case. No immediate complications were noted.  Manning Charity PA assisted in the entire procedure. An assistant was required for this procedure due to the complexity.  The assistant provided assistance in tissue manipulation and suction, and was required for the successful and safe performance of the procedure. I performed the critical portions of the procedure.   Venetia Night MD

## 2023-11-06 NOTE — Progress Notes (Signed)
MRI & CT results released: CT angio chest 11/06/23: T10 vertebral body fracture, better evaluated on recent MRI thoracic spine.Associated mild prevertebral stranding/hemorrhage at this level, corresponding to the finding on recent CT abdomen/pelvis. No evidence of traumatic aortic injury.  MRI thoracic spine wo contrast 11/05/23:  Acute transverse fracture extending through the T10 vertebral body, with extension through the posterior elements on the right. Finding consistent with an acute "Chance" type fracture, an unstable injury by definition. No significant vertebral body height loss. Probable injury/disruption of the anterior longitudinal ligament at this level. No other acute traumatic injury within the thoracic spine or spinal cord. Small layering bilateral pleural effusions  Discussed case with Neuro-surgery on call who is reviewing chart for possible OR intervention.   - patient NPO  Betsey Holiday, AGACNP-BC Acute Care Nurse Practitioner [facility]   [phone] / [phone] Please see Amion for details.

## 2023-11-06 NOTE — Progress Notes (Signed)
[facility] Interventional Cardiology Note  Date: 11/06/23  Time: 7:45 AM  Patient was found to have a Chance fracture at T10 on CT of the abdomen and pelvis yesterday evening.  He was evaluated by Dr. Marcell Barlow of neurosurgery, who has recommended urgent surgical stabilization due to concern for this being a highly unstable fracture that could lead to paralysis.  Given that Mr. Musselman had been on apixaban and received aspirin and clopidogrel as part of his PCI yesterday, he has increased risk for bleeding.  Given high risk nature of the patient's fracture, I recommend proceeding with neurosurgical intervention per Dr. Osborne Oman recommendations.  In regard to his antiplatelet therapy, I agree with holding clopidogrel in the short-term.  If platelets need to be given to achieve adequate hemostasis, this should be done judiciously in order to minimize the risk for thrombosis of the distal LCx stent placed yesterday.  Clopidogrel should be resumed as soon as it is felt safe to do so by Dr. Marcell Barlow.  Aspirin should be continued in the perioperative period if it is felt safe to do so by Dr. Marcell Barlow.  Case was reviewed with Dr. Kirke Corin, who will also be rounding on Mr. Boyadjian today and is on-call for interventional cardiology over the weekend if any questions or concerns arise.  Yvonne Kendall, MD [facility]

## 2023-11-06 NOTE — Progress Notes (Signed)
    Subjective    S/p back surgery earlier today.  No chest pain.  Says he has chronic dyspnea - no acute changes.    Objective    Vitals:   11/06/23 1600 11/06/23 1700  BP: 130/75 (!) 104/48  Pulse: 92 87  Resp: 14 13  Temp: (!) 96.8 F (36 C)   SpO2: 93% 92%   Lab Results  Component Value Date   WBC 7.6 11/06/2023   HGB 13.0 11/06/2023   HCT 40.3 11/06/2023   MCV 90.6 11/06/2023   PLT 110 (L) 11/06/2023    Lab Results  Component Value Date   CREATININE 1.78 (H) 11/06/2023   BUN 28 (H) 11/06/2023   NA 136 11/06/2023   K 4.4 11/06/2023   CL 105 11/06/2023   CO2 22 11/06/2023   Recent Labs  Lab 11/05/23 1015 11/05/23 1346 11/05/23 1757 11/05/23 1840  TROPONINIHS 8 6 10 10      BNP    Component Value Date/Time   BNP 38.8 09/10/2021 1831   Assessment & Plan    1.  Inferior STEMI/CAD:  s/p DES to the LCX 11/14.  Required holding of antiplatelet rx this AM due to need for emergent thoracic spinal surgery.  No c/p.  Due for asa 324 tonight.  Follow.  We will look to resume clopidogrel when deemed feasible by neurosurgery.  2.  T10 fx: s/p ORIF.  Per neurosurgery.   Nicolasa Ducking, NP 11/06/2023, 5:41 PM

## 2023-11-06 NOTE — Transfer of Care (Addendum)
Immediate Anesthesia Transfer of Care Note  Patient: Jonathon Snow  Procedure(s) Performed: POSTERIOR THORACIC FUSION 4 LEVELS  Patient Location: PACU  Anesthesia Type:General  Level of Consciousness: awake, drowsy, and patient cooperative  Airway & Oxygen Therapy: Patient Spontanous Breathing and Patient connected to face mask oxygen  Post-op Assessment: Report given to RN and Post -op Vital signs reviewed and stable  Post vital signs: Reviewed and stable  Last Vitals:  Vitals Value Taken Time  BP 95/57 11/06/23 1315  Temp 36.1 C 11/06/23 1306  Pulse 79 11/06/23 1323  Resp 10 11/06/23 1323  SpO2 88 % 11/06/23 1323  Vitals shown include unfiled device data.  Dr Lorette Ang called to PACU d/t low BP.  Phenylephrine given with improvement.  Plan is for PACU to document assessment and send to ICU for further management.  Last Pain:  Vitals:   11/06/23 1306  TempSrc:   PainSc: Asleep         Complications: No notable events documented.

## 2023-11-06 NOTE — Progress Notes (Addendum)
Patient off the floor to OR. Platelets hung at 0936 and sent to OR with patient.

## 2023-11-06 NOTE — Anesthesia Postprocedure Evaluation (Signed)
Anesthesia Post Note  Patient: Jonathon Snow  Procedure(s) Performed: POSTERIOR THORACIC FUSION 4 LEVELS  Patient location during evaluation: PACU Anesthesia Type: General Level of consciousness: awake and alert Pain management: pain level controlled Vital Signs Assessment: post-procedure vital signs reviewed and stable Respiratory status: spontaneous breathing, nonlabored ventilation, respiratory function stable and patient connected to nasal cannula oxygen Cardiovascular status: blood pressure returned to baseline and stable Postop Assessment: no apparent nausea or vomiting Anesthetic complications: no  No notable events documented.   Last Vitals:  Vitals:   11/06/23 1330 11/06/23 1336  BP: (!) 98/53   Pulse: 82 86  Resp: (!) 27 18  Temp:  36.4 C  SpO2: 90% 92%    Last Pain:  Vitals:   11/06/23 1404  TempSrc:   PainSc: 10-Worst pain ever                 [employer]

## 2023-11-06 NOTE — Progress Notes (Signed)
NAME:  Jonathon Snow, MRN:  [id number], DOB:  07/09/1961, LOS: 1 ADMISSION DATE:  11/05/2023, CONSULTATION DATE:  11/05/23 REFERRING MD:  Dr. Okey Dupre, CHIEF COMPLAINT:  CODE STEMI  History of Present Illness:  62 yo M presenting to [facility] ED on 11/14 from home via EMS for evaluation of possible CODE STEMI after syncopal episode.  History obtained per chart review  and patient bedside report. Patient was cooking at home when he began to feel light-headed and dizzy, saw spots, ultimately synopsizing and falling backwards- hitting his head. Unsure length of unconsciousness, but patient called EMS himself. He is on Eliquis for history of PE/DVT and intracranial thrombus. He denied numbness or weakness in any limbs. Patient did report some chest pain earlier in the day, which has now resolved. Patient reports nausea and diarrhea over the last week and fever/chills over the last few days. Since admission, patient also complaining of LLQ abdominal pain. Patient also endorses chronic dyspnea that is unchanged (however, initially told cardiology he felt his dyspnea was increased from baseline), patient is not on oxygen chronically during the day or nocturnally. He also reported to cardiology mid back pain and diaphoresis. He denies any prior syncopal episodes but does report at times, sometimes when changing position quickly getting dizzy and light-headed. He denies tobacco/ ETOH/ recreational drug use. He denies any history of seizures.  EMS reported initial BP as hypotensive in the 80's which resolved prior to arrival in ED.   ED course: Upon arrival patient alert and oriented with stable vitals. Inferior STE noted & CODE STEMI called and patient taken for urgent PCI. TRH consulted for admission. 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 Patient also has 80-90% distal LCx stenosis concerning for acute plaque rupture. Moderate to severe but non critical LAD and ramus intermedius disease were also noted. PCI to LCx. Surgical consultation for scalp laceration and extraperitoneal soft tissue stranding. Heparin drip held overnight due to bleeding concerns. Patient mildly hypotensive post cath and peripheral levophed initiated. PCCM consulted.  Significant labs: (Labs/ Imaging personally reviewed) I, Cheryll Cockayne Rust-Chester, AGACNP-BC, personally viewed and interpreted this ECG. EKG Interpretation: Date: 11/05/23, EKG Time: 13:08, Rate: 63, Rhythm: NSR, QRS Axis: normal, Intervals: normal, ST/T Wave abnormalities: Inferior STE, Narrative Interpretation: Inferior STEMI Chemistry: Na+: 137, K+: 4.0, BUN/Cr.: 24/1.50, Serum CO2/ AG: 22/6 Hematology: WBC: 8.02, Hgb: 16.8 > 14,  Troponin: 8 > 6 > 10 > 10  CT head wo contrast 11/05/23: Heterogeneous appearance of the parotid glands with many small calcifications, which suggests chronic inflammation (such as with Sjogren's). CT cervical spine wo contrast 11/05/23:  No evidence of acute abnormality intracranially or in the cervical spine CT abdomen/pelvis wo contrast 11/05/23: pending  PCCM consulted for assistance in management and monitoring due to intermittent peripheral vasopressor requirements.  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  CT showed acute distraction injury of the thoracic spine T10, Neurosurgery consulted.  11/06/23:  Neurosurgery planing on T7-12 posterior fusion, to receive platelet transfusion prior to procedure.  Vasopressors weaned off. Left radial A-line placed  Interim History / Subjective:  -Overnight CTa Chest/Abdomen and MRI Thoracic spine concerning for T10 Vertebral body fracture with associated mild pervertebral stranding/hemorrhage ~ Neurosurgery was consulted -Neurosurgery planning on T7-12 posterior fusion ~ to receive 1 unit platelets prior to procedure -Arterial line placed per Neurosurgery request  -Vasopressors weaned off -Hemoglobin remains stable at 13 -Creatinine slightly worsened to 1.78 from 1.5, electrolytes acceptable, UOP 800 last 24 hrs (net +1.1 L)   Objective   Blood pressure 122/80, pulse 89, temperature 99.5 F (37.5 C), temperature source Oral, resp. rate 19, height 6\' 2"  (1.88 m), weight 102.8 kg, SpO2 96%.        Intake/Output Summary (Last 24 hours) at 11/06/2023 0820 Last data filed at 11/06/2023 0300 Gross per 24 hour  Intake 2175.61 ml  Output 1000 ml  Net 1175.61 ml   Filed Weights   11/05/23 1016 11/05/23 1301  Weight: 107.9 kg 102.8 kg    Examination: General: Adult male, acutely ill, lying in bed NAD HEENT: MM pink/moist, anicteric, closed- non bleeding scalp laceration, neck supple Neuro: A&O x 4, able to follow commands, PERRL +3, MAE CV: s1s2 RRR, NSR on monitor, no r/m/g Pulm: Regular, non labored on RA , breath sounds clear-BUL & clear/diminished-BLL GI: soft, rounded, mild LLQ tenderness, bs x 4 Skin: abdominal ecchymosis, posterior scalp laceration Extremities: warm/dry, pulses + 2 R/P, no edema noted  Resolved Hospital Problem list     Assessment & Plan:   #Mild Hypotension,  possible vagal response vs ? Neurogenic component with spinal fracture  #Inferior STEMI s/p PCI to LCx #Syncope PMHx: HTN, PE/DVT and intracerebral venous thrombosis, HLD, HFpEF Cardiac Cath 11/14: severe multivessel disease, 80-90% stenosis in distal LCx,  moderate to severe but non critical proximal/mid LAD disease,  -Continuous cardiac monitoring -Maintain MAP >65 -IV fluids -Vasopressors as needed to maintain MAP goal -Trend lactic acid until normalized -HS Troponin negative x3 -Echocardiogram pending -Hold home antihypertensives for now -Cardiology following, appreciate input -Heparin/Eliquis/Antiplatelets currently on hold for plan for OR and T7-12 fusion  #Unstable Thoracic Spine T10 fracture (Chance Fracture) #Acute on Chronic Back pain -Neurosurgery following, appreciate input ~ plan for T7-12 posterior fusion -Pain control  #Abdominal Hematoma #Thrombocytopenia -Monitor for S/Sx of bleeding -Trend CBC (H&H q6h) -SCD's for VTE Prophylaxis  -Transfuse for Hgb <7 (H&H q6h) -General Surgery following, appreciate input  #AKI on CKD stage 3a -Monitor I&O's / urinary output -Follow BMP -Ensure adequate renal perfusion -Avoid nephrotoxic agents as able -Replace electrolytes as indicated ~ Pharmacy following for assistance with electrolyte replacement -IV fluids  #Chronic SLE - outpatient regimen continued per primary     Best Practice (right click and "Reselect all SmartList Selections" daily)  Diet/type: NPO DVT prophylaxis: SCD GI prophylaxis: N/A Lines: Left radial A-line, still needed Foley:  N/A Code Status:  full code Last date of multidisciplinary goals of care discussion [11/15]  11/15: Pt updated at bedside on plan of care  Labs   CBC: Recent Labs  Lab 11/05/23 1015 11/05/23 1757 11/06/23 0053 11/06/23 0425  WBC 8.0 8.8  --  7.6  NEUTROABS 6.8 7.2  --   --   HGB 16.8 14.0 13.0 13.0  HCT 52.9* 42.9 40.8 40.3  MCV 93.1 90.5  --  90.6  PLT 149* 111*  --  110*    Basic Metabolic Panel: Recent Labs  Lab 11/05/23 1015 11/05/23 1840 11/06/23 0425  NA 140 137 136  K 3.9 4.0 4.4  CL 105 109 105  CO2 25 22 22   GLUCOSE 119* 96 110*  BUN 24* 24* 28*  CREATININE 1.64* 1.50* 1.78*  CALCIUM 9.2  7.9* 8.0*  MG 2.1  --  2.0  PHOS 2.6  --  3.8   GFR: Estimated Creatinine Clearance: 55.7 mL/min (A) (by C-G formula based on SCr of 1.78 mg/dL (H)). Recent Labs  Lab 11/05/23 1015 11/05/23 1757 11/06/23 0425  WBC 8.0 8.8 7.6    Liver Function Tests: Recent Labs  Lab 11/05/23 1015 11/05/23 1840  AST 21 17  ALT 19 17  ALKPHOS 73 57  BILITOT 1.2* 1.2*  PROT 7.9 5.9*  ALBUMIN 4.4 3.4*   Recent Labs  Lab 11/05/23 1840  LIPASE 34   No results for input(s): "AMMONIA" in the last 168 hours.  ABG    Component Value Date/Time   PHART 7.32 (L) 02/22/2018 0500   PCO2ART 50 (H) 02/22/2018 0500   PO2ART 119 (H) 02/22/2018 0500   HCO3 25.8 02/22/2018 0500   TCO2 26 12/09/2013 2348   ACIDBASEDEF 1.0 02/22/2018 0500   O2SAT 98.3 02/22/2018 0500     Coagulation Profile: Recent Labs  Lab 11/05/23 1015  INR 1.2    Cardiac Enzymes: No results for input(s): "CKTOTAL", "CKMB", "CKMBINDEX", "TROPONINI" in the last 168 hours.  HbA1C: Hgb A1c MFr Bld  Date/Time Value Ref Range Status  10/28/2023 01:59 PM 6.0 (H) <5.7 % of total Hgb Final    Comment:    For someone without known diabetes, a hemoglobin  A1c value between 5.7% and 6.4% is consistent with prediabetes and should be confirmed with a  follow-up test. . For someone with known diabetes, a value <7% indicates that their diabetes is well controlled. A1c targets should be individualized based on duration of diabetes, age, comorbid conditions, and other considerations. . This assay result is consistent with an increased risk of diabetes. . Currently, no consensus exists regarding use of hemoglobin A1c for diagnosis of diabetes for children. [name]   09/09/2022 10:40 AM 5.5 <5.7 % of total Hgb Final    Comment:    For the purpose of screening for the presence of diabetes: . <5.7%       Consistent with the absence of diabetes 5.7-6.4%    Consistent with increased risk for diabetes             (prediabetes) >  or =6.5%  Consistent with diabetes . This assay result is consistent with a decreased risk of diabetes. . Currently, no consensus exists regarding use of hemoglobin A1c for diagnosis of diabetes in children. . According to American Diabetes Association (ADA) guidelines, hemoglobin A1c <7.0% represents optimal control in non-pregnant diabetic patients. Different metrics may apply to specific patient populations.  Standards of Medical Care in Diabetes(ADA). .     CBG: Recent Labs  Lab 11/05/23 1256  GLUCAP 93    Review of Systems:   Gen: Denies fever, chills, weight change, fatigue, night sweats HEENT: Denies blurred vision, double vision, hearing loss, tinnitus, sinus congestion, rhinorrhea, sore throat, neck stiffness, dysphagia PULM: Denies shortness of breath, cough, sputum production, hemoptysis, wheezing CV: Denies chest pain, edema, orthopnea, paroxysmal nocturnal dyspnea, palpitations GI: Denies abdominal pain, nausea, vomiting, diarrhea, hematochezia, melena, constipation, change in bowel habits GU: Denies dysuria, hematuria, polyuria, oliguria, urethral discharge Endocrine: Denies hot or cold intolerance, polyuria, polyphagia or appetite change Derm:  Denies rash, dry skin, scaling or peeling skin change Heme: Denies easy bruising, bleeding, bleeding gums Neuro: Denies headache, numbness, weakness, slurred speech, loss of memory or consciousness  Past Medical History:  He,  has a past medical history of Calculus of kidney (08/21/2013), Cerebral venous sinus thrombosis (08/21/2013), COVID-19 virus infection (12/2020), Depression, DVT (deep venous thrombosis) (HCC), Dyspnea, GERD (gastroesophageal reflux disease), Heel spur, left (02/16/2019), Heel spur, right (02/16/2019), Herpes zoster infection of lumbar region (02/20/2020), History of kidney stones, Hyperlipidemia, Hypertension, Lupus, Lymphedema (10/07/2018), Morbid obesity (HCC), Opiate abuse, episodic (HCC)  (02/26/2018), Osteoporosis, Pneumonia, PONV (postoperative nausea and vomiting), Postphlebitic syndrome with ulcer, left (HCC) (11/18/2016), Presence of IVC filter (03/22/2020), Pulmonary embolism (HCC), and Renal disorder.   Surgical History:   Past Surgical History:  Procedure Laterality Date   ANKLE SURGERY Right    BACK SURGERY     BRONCHIAL WASHINGS N/A 11/01/2021   Procedure: BRONCHIAL WASHINGS;  Surgeon: Vida Rigger, MD;  Location: [facility] ORS;  Service: Thoracic;  Laterality: N/A;   COLONOSCOPY WITH PROPOFOL N/A 05/28/2020   Procedure: COLONOSCOPY WITH PROPOFOL;  Surgeon: Wyline Mood, MD;  Location[facility] ENDOSCOPY;  Service: Endoscopy;  Laterality: N/A;   COLONOSCOPY WITH PROPOFOL N/A 07/01/2023   Procedure: COLONOSCOPY WITH PROPOFOL;  Surgeon: Wyline Mood, MD;  Location: [facility] ENDOSCOPY;  Service: Gastroenterology;  Laterality: N/A;   CORONARY ULTRASOUND/IVUS N/A 11/05/2023   Procedure: Coronary Ultrasound/IVUS;  Surgeon: Yvonne Kendall, MD;  Location: [facility] INVASIVE CV LAB;  Service: Cardiovascular;  Laterality: N/A;   CORONARY/GRAFT ACUTE MI REVASCULARIZATION N/A 11/05/2023   Procedure: Coronary/Graft Acute MI Revascularization;  Surgeon: Yvonne Kendall, MD;  Location: [facility] INVASIVE CV LAB;  Service: Cardiovascular;  Laterality: N/A;   CYST EXCISION  92 or 93    Liver cyst removal [facility]   FLEXIBLE BRONCHOSCOPY N/A 11/01/2021   Procedure: FLEXIBLE BRONCHOSCOPY;  Surgeon: Vida Rigger, MD;  Location: [facility] ORS;  Service: Thoracic;  Laterality: N/A;   HIP PINNING,CANNULATED Left 02/19/2023   Procedure: PERCUTANEOUS FIXATION OF FEMORAL NECK;  Surgeon: Signa Kell, MD;  Location: [facility] ORS;  Service: Orthopedics;  Laterality: Left;   I & D EXTREMITY Right 04/29/2017   Procedure: IRRIGATION AND DEBRIDEMENT EXTREMITY;  Surgeon: Ricarda Frame, MD;  Location: [facility] ORS;  Service: General;  Laterality: Right;   IRRIGATION AND DEBRIDEMENT ABSCESS Left 04/29/2017   Procedure: IRRIGATION  AND DEBRIDEMENT Scrotal ABSCESS;  Surgeon: Ricarda Frame, MD;  Location: [facility] ORS;  Service: General;  Laterality: Left;   LEFT HEART CATH AND CORONARY ANGIOGRAPHY N/A 11/05/2023   Procedure: LEFT HEART CATH AND CORONARY ANGIOGRAPHY;  Surgeon: Yvonne Kendall, MD;  Location: [facility] INVASIVE CV LAB;  Service: Cardiovascular;  Laterality: N/A;   POLYPECTOMY  07/01/2023   Procedure: POLYPECTOMY;  Surgeon: Wyline Mood, MD;  Location[facility] ENDOSCOPY;  Service: Gastroenterology;;     Social History:   reports that he has never smoked. He has never used smokeless tobacco. He reports that he does not drink alcohol and does not use drugs.   Family History:  His family history includes Clotting disorder in his mother; Heart attack in his maternal grandfather, maternal grandmother, and paternal grandfather; Heart disease in his father; Hypertension in his father; Kidney disease in his brother.   Allergies Allergies  Allergen Reactions   Enalapril Other (See Comments)    Unknown reaction   Vicodin [Hydrocodone-Acetaminophen] Hives and Rash    Severe headaches (also) NAME BRAND ONLY PER PT CAN TAKE GENERIC     Home Medications  Prior to Admission  medications   Medication Sig Start Date End Date Taking? Authorizing Provider  albuterol (VENTOLIN HFA) 108 (90 Base) MCG/ACT inhaler Inhale 2 puffs into the lungs every 6 (six) hours as needed for wheezing or shortness of breath. 10/29/22   Berniece Salines, FNP  amLODipine (NORVASC) 5 MG tablet Take 1 tablet (5 mg total) by mouth daily. 04/07/23   Danelle Berry, PA-C  apixaban (ELIQUIS) 5 MG TABS tablet Take 1 tablet (5 mg total) by mouth 2 (two) times daily. 09/29/23   Alinda Dooms, NP  ARIPiprazole (ABILIFY) 2 MG tablet  06/26/23   [provider]  cholecalciferol (VITAMIN D3) 25 MCG (1000 UNIT) tablet Take 1,000 Units by mouth daily.    [provider]  gabapentin (NEURONTIN) 300 MG capsule Take 1 capsule (300 mg total) by mouth 3  (three) times daily. 10/28/23   Berniece Salines, FNP  hydrOXYzine (ATARAX) 25 MG tablet Take 25 mg by mouth 2 (two) times daily. 08/13/23   [provider]  levocetirizine (XYZAL) 5 MG tablet Take 1 tablet (5 mg total) by mouth every evening. 12/08/22   Danelle Berry, PA-C  LORazepam (ATIVAN) 0.5 MG tablet Take 1 tablet (0.5 mg total) by mouth 2 (two) times daily as needed for anxiety. 04/24/23   Danelle Berry, PA-C  losartan (COZAAR) 100 MG tablet Take 1 tablet (100 mg total) by mouth daily. 04/07/23   Danelle Berry, PA-C  montelukast (SINGULAIR) 10 MG tablet Take 1 tablet (10 mg total) by mouth at bedtime. 12/08/22   Danelle Berry, PA-C  Multiple Vitamins-Minerals (MULTIVITAMIN WITH MINERALS) tablet Take 1 tablet by mouth daily.    [provider]  mycophenolate (CELLCEPT) 500 MG tablet Take 1,000 mg by mouth 2 (two) times daily.    [provider]  predniSONE (DELTASONE) 10 MG tablet Take 1 tablet by mouth daily. 11/28/22   [provider]  rosuvastatin (CRESTOR) 10 MG tablet Take 1 tablet (10 mg total) by mouth daily. 04/07/23   Danelle Berry, PA-C  sertraline (ZOLOFT) 100 MG tablet Take 1 tablet (100 mg total) by mouth daily. 07/07/23   Danelle Berry, PA-C  tadalafil (CIALIS) 20 MG tablet Take 1 tablet (20 mg total) by mouth daily as needed for erectile dysfunction. 07/22/23   Vanna Scotland, MD  traMADol (ULTRAM) 50 MG tablet Take 1 tablet (50 mg total) by mouth every 6 (six) hours as needed for moderate pain. 02/20/23   Dedra Skeens, PA-C     Critical care time: 40 minutes    Harlon Ditty, AGACNP-BC [facility] Prefer epic messenger for cross cover needs If after hours, please call E-link

## 2023-11-06 NOTE — Progress Notes (Signed)
*  PRELIMINARY RESULTS* Echocardiogram 2D Echocardiogram has been performed.  Carolyne Fiscal 11/06/2023, 4:13 PM

## 2023-11-06 NOTE — H&P (View-Only) (Signed)
Consult requested by:  Jonathon Snow  Consult requested for:  Unstable T10 fracture  Primary Physician:  Jonathon Berry, PA-C  History of Present Illness: 11/06/2023 [name] is here today with a chief complaint of fall of unclear etiology yesterday.  He presented to the hospital where he had severe chest pain and was a STEMI activation.  He went to the cardiac lab where he had a stent placed in his left circumflex artery.  He was loaded with Plavix and was on Eliquis until yesterday.  He is currently not on Eliquis and has not started aspirin and Plavix yet orally.  He is also on CellCept for his lupus.  He has severe chest pain.  He denies any neurologic symptoms otherwise.  He has not mobilized since his procedure.  I was called late last evening when an unstable fracture was identified. Review of Systems:  A 10 point review of systems is negative, except for the pertinent positives and negatives detailed in the HPI.  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   Overview:  superior sagittal sinus, left transverse sinus and cortical veins    COVID-19 virus infection 12/2020   Depression    DVT (deep venous thrombosis) (HCC)    Dyspnea    GERD (gastroesophageal reflux disease)    Heel spur, left 02/16/2019   Heel spur, right 02/16/2019   Herpes zoster infection of lumbar region 02/20/2020   History of kidney stones    Hyperlipidemia    Hypertension    Lupus    Lymphedema 10/07/2018   Morbid obesity (HCC)    Opiate abuse, episodic (HCC) 02/26/2018   Osteoporosis    Pneumonia    PONV (postoperative nausea and vomiting)    Postphlebitic syndrome with ulcer, 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   Presence of IVC filter 03/22/2020   Removed   Pulmonary embolism (HCC)    Renal disorder    Stage III    Past Surgical History: Past Surgical History:  Procedure Laterality Date    ANKLE SURGERY Right    BACK SURGERY     BRONCHIAL WASHINGS N/A 11/01/2021   Procedure: BRONCHIAL WASHINGS;  Surgeon: Vida Rigger, MD;  Location: [facility] ORS;  Service: Thoracic;  Laterality: N/A;   COLONOSCOPY WITH PROPOFOL N/A 05/28/2020   Procedure: COLONOSCOPY WITH PROPOFOL;  Surgeon: Wyline Mood, MD;  Location: [facility] ENDOSCOPY;  Service: Endoscopy;  Laterality: N/A;   COLONOSCOPY WITH PROPOFOL N/A 07/01/2023   Procedure: COLONOSCOPY WITH PROPOFOL;  Surgeon: Wyline Mood, MD;  Location: [facility] ENDOSCOPY;  Service: Gastroenterology;  Laterality: N/A;   CYST EXCISION  92 or 93    Liver cyst removal [facility]   FLEXIBLE BRONCHOSCOPY N/A 11/01/2021   Procedure: FLEXIBLE BRONCHOSCOPY;  Surgeon: Vida Rigger, MD;  Location: [facility] ORS;  Service: Thoracic;  Laterality: N/A;   HIP PINNING,CANNULATED Left 02/19/2023   Procedure: PERCUTANEOUS FIXATION OF FEMORAL NECK;  Surgeon: Signa Kell, MD;  Location: [facility] ORS;  Service: Orthopedics;  Laterality: Left;   I & D EXTREMITY Right 04/29/2017   Procedure: IRRIGATION AND DEBRIDEMENT EXTREMITY;  Surgeon: Ricarda Frame, MD;  Location: [facility] ORS;  Service: General;  Laterality: Right;   IRRIGATION AND DEBRIDEMENT ABSCESS Left 04/29/2017   Procedure: IRRIGATION AND DEBRIDEMENT Scrotal ABSCESS;  Surgeon: Ricarda Frame, MD;  Location: [facility] ORS;  Service: General;  Laterality: Left;   POLYPECTOMY  07/01/2023   Procedure: POLYPECTOMY;  Surgeon: Wyline Mood, MD;  Location: [facility] ENDOSCOPY;  Service: Gastroenterology;;    Allergies: Allergies as of 11/05/2023 - Review Complete 11/05/2023  Allergen Reaction Noted   Enalapril Other (See Comments) 07/31/2015   Vicodin [hydrocodone-acetaminophen] Hives and Rash 12/09/2013    Medications: No outpatient medications have been marked as taking for the 11/05/23 encounter [facility] Encounter).    Social History: Social History   Tobacco Use   Smoking status: Never   Smokeless tobacco: Never  Vaping Use   Vaping  status: Never Used  Substance Use Topics   Alcohol use: No   Drug use: No    Comment: PT DENIES    Family Medical History: Family History  Problem Relation Age of Onset   Hypertension Father    Heart disease Father    Clotting disorder Mother    Kidney disease Brother    Heart attack Maternal Grandmother    Heart attack Maternal Grandfather    Heart attack Paternal Grandfather     Physical Examination: Vitals:   11/06/23 0645 11/06/23 0700  BP: 122/78 122/80  Pulse: 81 89  Resp: 15 19  Temp:    SpO2: 95% 96%    General: Patient is in no apparent distress. Attention to examination is appropriate.  Neck:   Supple.  Full range of motion.  Respiratory: Patient is breathing without any difficulty.   NEUROLOGICAL:     Awake, alert, oriented to person, place, and time.  Speech is clear and fluent.  Cranial Nerves: Pupils equal round and reactive to light.  Facial tone is symmetric.  Facial sensation is symmetric. Shoulder shrug is symmetric. Tongue protrusion is midline.   Strength: Side Biceps Triceps Deltoid Interossei Grip Wrist Ext. Wrist Flex.  R 5 5 5 5 5 5 5   L 5 5 5 5 5 5 5    Side Iliopsoas Quads Hamstring PF DF EHL  R 5 5 5 5 5 5   L 5 5 5 5 5 5     Bilateral upper and lower extremity sensation is intact to light touch.    No evidence of dysmetria noted.  Gait is untested.     Medical Decision Making  Imaging: MRI T spine 11/05/2023 IMPRESSION: 1. Acute transverse fracture extending through the T10 vertebral body, with extension through the posterior elements on the right. Finding consistent with an acute "Chance" type fracture, an unstable injury by definition. No significant vertebral body height loss. Probable injury/disruption of the anterior longitudinal ligament at this level. 2. No other acute traumatic injury within the thoracic spine or spinal cord. 3. Small layering bilateral pleural effusions.     Electronically Signed   By:  Rise Mu M.D.   On: 11/06/2023 00:57  CT T spine 11/05/2023 IMPRESSION: T10 vertebral body fracture, better evaluated on recent MRI thoracic spine.   Associated mild prevertebral stranding/hemorrhage at this level, corresponding to the finding on recent CT abdomen/pelvis.   No evidence of traumatic aortic injury.   Aortic Atherosclerosis (ICD10-I70.0).     Electronically Signed   By: Charline Bills M.D.   On: 11/06/2023 00:55    I have personally reviewed the images and agree with the above interpretation.  Assessment and Plan: Mr. Wienckowski is a pleasant 62 y.o. male with an unstable 3 column fracture of the thoracic spine at T10 consistent with a Chance fracture.  He has an anterior longitudinal ligament injury in addition to anterior, middle, and posterior column injuries.  He is at extremely high risk of spinal cord injury with mobilization or  falls.   Unfortunate, there are multiple complicating matters.  He was loaded on Plavix yesterday for stenting procedure.  Fortunately, it looks as though he has not had a major cardiac event associated with his presentation.  However, he will be at risk of in-stent thrombosis as he will need to have fresh platelets administered to allow for surgical fixation of this fracture.  Additionally, he is on CellCept for his lupus.  We will hold that for at least a few days to allow for surgical site healing.  He was on Eliquis until prior to presentation.  His INR is now normalized.  We will cross 2 units of platelets and give 1 now.  Have also asked the ICU to place an arterial line to monitor his blood pressure.  Based on the level of fracture, I have recommended T7-12 fixation to allow for operative fixation of his T10 fracture and fusion of the thoracic spine.  I discussed the planned procedure at length with the patient, including the risks, benefits, alternatives, and indications. The risks discussed include but are not limited  to bleeding, infection, need for reoperation, spinal fluid leak, stroke, vision loss, anesthetic complication, coma, paralysis, and even death. I also described in detail that improvement was not guaranteed.  The patient expressed understanding of these risks, and asked that we proceed with surgery. I described the surgery in layman's terms, and gave ample opportunity for questions, which were answered to the best of my ability.  I have discussed with him the complicating matters of his condition.  Because of the extreme inherent instability of this type of fracture, I do not think there is benefit to waiting for surgical fixation.  He is at significant risk of spinal cord injury with any falls.  I have spoken with Dr. Okey Dupre, and we will delay restarting Plavix for a few days.  I will contact Dr. Lourdes Sledge as well, who manages his medications for his lupus and kidneys.  I have communicated my recommendations to the requesting physician and coordinated care to facilitate these recommendations.     [place] Dalporto K. Myer Haff MD, [facility] Neurosurgery

## 2023-11-06 NOTE — Interval H&P Note (Signed)
History and Physical Interval Note:  11/06/2023 9:11 AM  Jonathon Snow  has presented today for surgery, with the diagnosis of unstable thoracic spine fracture T10.  The various methods of treatment have been discussed with the patient and family. After consideration of risks, benefits and other options for treatment, the patient has consented to  Procedure(s) with comments: POSTERIOR THORACIC FUSION 4 LEVELS (N/A) - T7-12 posterior fusion, cement augmentation as a surgical intervention.  The patient's history has been reviewed, patient examined, no change in status, stable for surgery.  I have reviewed the patient's chart and labs.  Questions were answered to the patient's satisfaction.    Heart sounds normal no MRG. Chest Clear to Auscultation Bilaterally.  Dayshon Roback

## 2023-11-06 NOTE — Progress Notes (Signed)
Orthopedic Tech Progress Note Patient Details:  JONH POFFENBERGER 08-29-1961 [id number]  Called in order to [employer] for a TLSO BRACE.   Patient ID: Jonathon Snow, male   DOB: [birth year], 62 y.o.   MRN: [id number]  Donald Pore 11/06/2023, 9:13 PM

## 2023-11-06 NOTE — Procedures (Signed)
Arterial Catheter Insertion Procedure Note  Jonathon Snow  [id number]  27-Oct-1961  Date:11/06/23  Time:8:17 AM    Provider Performing: Judithe Modest    Procedure: Insertion of Arterial Line (46962) with US guidance ([id number])   Indication(s) Blood pressure monitoring and/or need for frequent ABGs  Consent Risks of the procedure as well as the alternatives and risks of each were explained to the patient and/or caregiver.  Consent for the procedure was obtained and is signed in the bedside chart  Anesthesia None   Time Out Verified patient identification, verified procedure, site/side was marked, verified correct patient position, special equipment/implants available, medications/allergies/relevant history reviewed, required imaging and test results available.   Sterile Technique Maximal sterile technique including full sterile barrier drape, hand hygiene, sterile gown, sterile gloves, mask, hair covering, sterile ultrasound probe cover (if used).   Procedure Description Area of catheter insertion was cleaned with chlorhexidine and draped in sterile fashion. With real-time ultrasound guidance an arterial catheter was placed into the left radial artery.  Appropriate arterial tracings confirmed on monitor.     Complications/Tolerance None; patient tolerated the procedure well.   EBL Minimal   Specimen(s) None    Jonathon Snow, AGACNP-BC [facility] Prefer epic messenger for cross cover needs If after hours, please call E-link

## 2023-11-07 DIAGNOSIS — I2121 ST elevation (STEMI) myocardial infarction involving left circumflex coronary artery: Secondary | ICD-10-CM | POA: Diagnosis not present

## 2023-11-07 LAB — BPAM PLATELET PHERESIS
Blood Product Expiration Date: 202411172359
Blood Product Expiration Date: 202411162359
ISSUE DATE / TIME: 202411150932
Unit Type and Rh: 5100
Unit Type and Rh: 5100

## 2023-11-07 LAB — BASIC METABOLIC PANEL
Anion gap: 9 (ref 5–15)
Anion gap: 8 (ref 5–15)
BUN: 31 mg/dL — ABNORMAL HIGH (ref 8–23)
BUN: 36 mg/dL — ABNORMAL HIGH (ref 8–23)
CO2: 20 mmol/L — ABNORMAL LOW (ref 22–32)
CO2: 21 mmol/L — ABNORMAL LOW (ref 22–32)
Calcium: 7.7 mg/dL — ABNORMAL LOW (ref 8.9–10.3)
Calcium: 7.7 mg/dL — ABNORMAL LOW (ref 8.9–10.3)
Chloride: 107 mmol/L (ref 98–111)
Chloride: 107 mmol/L (ref 98–111)
Creatinine, Ser: 2.02 mg/dL — ABNORMAL HIGH (ref 0.61–1.24)
Creatinine, Ser: 2.89 mg/dL — ABNORMAL HIGH (ref 0.61–1.24)
GFR, Estimated: 37 mL/min — ABNORMAL LOW (ref >60)
GFR, Estimated: 24 mL/min — ABNORMAL LOW (ref >60)
Glucose, Bld: 147 mg/dL — ABNORMAL HIGH (ref 70–99)
Glucose, Bld: 126 mg/dL — ABNORMAL HIGH (ref 70–99)
Potassium: 5 mmol/L (ref 3.5–5.1)
Potassium: 5.2 mmol/L — ABNORMAL HIGH (ref 3.5–5.1)
Sodium: 136 mmol/L (ref 135–145)
Sodium: 136 mmol/L (ref 135–145)

## 2023-11-07 LAB — CBC
HCT: 34.6 % — ABNORMAL LOW (ref 39.0–52.0)
HCT: 35.8 % — ABNORMAL LOW (ref 39.0–52.0)
Hemoglobin: 11.2 g/dL — ABNORMAL LOW (ref 13.0–17.0)
Hemoglobin: 11.4 g/dL — ABNORMAL LOW (ref 13.0–17.0)
MCH: 29.4 pg (ref 26.0–34.0)
MCH: 30.2 pg (ref 26.0–34.0)
MCHC: 32.4 g/dL (ref 30.0–36.0)
MCHC: 31.8 g/dL (ref 30.0–36.0)
MCV: 90.8 fL (ref 80.0–100.0)
MCV: 94.7 fL (ref 80.0–100.0)
Platelets: 118 10*3/uL — ABNORMAL LOW (ref 150–400)
Platelets: 112 10*3/uL — ABNORMAL LOW (ref 150–400)
RBC: 3.81 MIL/uL — ABNORMAL LOW (ref 4.22–5.81)
RBC: 3.78 MIL/uL — ABNORMAL LOW (ref 4.22–5.81)
RDW: 15.8 % — ABNORMAL HIGH (ref 11.5–15.5)
RDW: 15.9 % — ABNORMAL HIGH (ref 11.5–15.5)
WBC: 8.6 10*3/uL (ref 4.0–10.5)
WBC: 8.7 10*3/uL (ref 4.0–10.5)
nRBC: 0 % (ref 0.0–0.2)
nRBC: 0 % (ref 0.0–0.2)

## 2023-11-07 LAB — PREPARE PLATELET PHERESIS
Unit division: 0
Unit division: 0

## 2023-11-07 MED ORDER — OXYCODONE HCL 5 MG PO TABS: 7.5000 mg | ORAL_TABLET | ORAL | Status: DC | PRN

## 2023-11-07 MED ORDER — HEPARIN SODIUM (PORCINE) 5000 UNIT/ML IJ SOLN: 5000.0000 [IU] | Freq: Three times a day (TID) | INTRAMUSCULAR | Status: DC

## 2023-11-07 MED ORDER — SODIUM CHLORIDE 0.9 % IV BOLUS
500.0000 mL | Freq: Once | INTRAVENOUS | Status: AC
  Administered 2023-11-07: 500 mL via INTRAVENOUS

## 2023-11-07 NOTE — Plan of Care (Signed)
  Problem: Education: Goal: Knowledge of General Education information will improve Description Including pain rating scale, medication(s)/side effects and non-pharmacologic comfort measures Outcome: Progressing   Problem: Clinical Measurements: Goal: Ability to maintain clinical measurements within normal limits will improve Outcome: Progressing   Problem: Safety: Goal: Ability to remain free from injury will improve Outcome: Progressing   Problem: Skin Integrity: Goal: Risk for impaired skin integrity will decrease Outcome: Progressing   

## 2023-11-07 NOTE — Progress Notes (Addendum)
PROGRESS NOTE    Jonathon Snow   VZD:[id number] DOB: 1961-05-02  DOA: 11/05/2023 Date of Service: 11/07/23 which is hospital day 2  PCP: Danelle Berry, PA-C    HPI: Jonathon Snow is a 62 y.o. 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 Patient suddenly felt lightheadedness while cooking breakfast alone at home and then passed out and collapsed on the floor and hit the backside of his head. Recovered consciousness by his own and seeing breakfast still cooking on the stove top. Unsure about duration of unconsciousness. Able to call EMS himself.   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  Noncritical stenosis of LAD and ramus, not stented.  CT head and neck negative for fracture or dislocation. Hypotensive requiring pressors, concern w/ hematoma forearm at cath site as well as abdomen, CT showed distraction injury T10, abdominal wall hematoma no concerns for internal hemorrhage. Neurosurgery consult and additional imaging.  11/15: T7-12 posterior fusion, Plt transfusion. Echo - EF 55-60%, no RWMA, G1 diast df ,mild elevated pulm artery pressure, no significant valvular disease.  Stable following surgery  11/16: hypotensive, back on pressors   Consultants:  Cardiology  Critical Care General Surgery  Neurosurgery   Procedures/Surgeries: 11/14: cardiac cath w/ DES to the LCX  11/15: arterial line insertion 11/15: Open Reduction and internal fixation of T10 fracture, Posterolateral arthrodesis T7 to T12      ASSESSMENT & PLAN:   STEMI S/p successful LCx stenting 11/14 Echocardiogram no serious abnormality - EF 55-60%, no RWMA, G1 diast df ,mild elevated pulm artery pressure, no significant valvular disease. D/w  neurosurgery today Dr Adriana Simas- ASA ok for now, hold Plavix until ideally Monday 11/18 increased statin dosage Holding beta blocker and ACE/ARB d/t hypotension in setting of likely spinal shock +/- opiate effect see below  cardiology recs for outpatient functional study for elective revascularization   Hypotension Ddx: neurogenic shock in setting of recent trauma/surgery, complicated by opiate adverse effect, less likely bleeding but watching this closely given thrombocytopenia and hematomas Norepinephrine, additional pressor support as needed - ICU following  IV fluids  Holding antihypertensives and anticoagulant/antiplatelet, will d/w cardiology if risk for restenosis would warrant plavix/asa despite risk bleeding   Unstable thoracic spine fracture T10 s/p T7-12 posterior fusion 11/15  Neurosurgery following. TLSO brace Pain control - limiting opiates d/t hypotension   Thrombocytopenia  Bleeding/hematoma, high bleed risk  Forearm Hematoma noted 11/14 post-cath at TR band site Monitor CBC Holding all anticoag at this time - got one dose heparin today for DVT ppx but will maintain on SCD for now   AKI on CKD3a Concern for hypotension/hypoperfusion  IV fluids + pressors to maintain perfusion  LLQ Abdominal wall hematoma  serial CBCs  Holding heparin, ASA, plavix   Chest pain evening 11/14, subsequently bradycardic and hypotensive - resolved with heart rates into the 40s and systolic pressures in the 60s. Concern for hypovolemia/bleeding, vasovagal urinary retention vs other, w/ newly found T10 fx possible cord syndrome most likely cause for neurogenic shock  Pressors - norepi weaned off 11/14 btu back on 11/16 ICU team has s/o     Urine retention Cath as needed / Foley   Syncope suspect MI associated ventricle arrhythmia vs vasovagal  Pressure too  low to consider beta-blocking right now   Scalp laceration Surgical consult for stapling   Hx of PE/DVT and intracranial venous  thrombosis On Eliquis prior to hospitalization  Holding anticoag for now given bleeding issues  Expect patient discharged home on Eliquis    AKI on CKD stage IIIa Likely component of contrast induced w/ catheterization  IV fluids, po as able  Monitor BMP Avoid nephrotoxins    HTN Hold home antihypertensives for now given hypotension   SLE Stable holding CellCept Continue steroid 10 mg daily   overweight based on BMI: Body mass index is 29.1 kg/m.  Underweight - under 18.5  normal weight - 18.5 to 24.9 overweight - 25 to 29.9 obese - 30 or more   DVT prophylaxis: SCD IV fluids: gentle continuous IV fluids  given low BP / bleeding concern Nutrition: regular diet  Central lines / invasive devices: none  Code Status: FULL CODE ACP documentation reviewed:  none on file in [facility]  TOC needs: TBD Barriers to dispo / significant pending items: see above             Subjective / Brief ROS:  Patient reports feeling woozy and nauseous, low BP at this time  Denies worsening abdominal pain  Denies CP/SOB.     Family Communication: spoke on phone this morning w/ daughter [name] as we were restarting pressor support all questions answered     Objective Findings:  Vitals:   11/07/23 1115 11/07/23 1130 11/07/23 1145 11/07/23 1154  BP:      Pulse: 87 83 87 78  Resp: 12 (!) 9 (!) 8 12  Temp:    97.6 F (36.4 C)  TempSrc:    Oral  SpO2: 94% 93% 91% 93%  Weight:      Height:        Intake/Output Summary (Last 24 hours) at 11/07/2023 1320 Last data filed at 11/07/2023 1057 Gross per 24 hour  Intake 714 ml  Output 725 ml  Net -11 ml   Filed Weights   11/05/23 1016 11/05/23 1301  Weight: 107.9 kg 102.8 kg    Examination:  Physical Exam Constitutional:      General: He is not in acute distress.    Appearance: He is ill-appearing and diaphoretic.  Cardiovascular:     Rate and Rhythm: Normal rate and regular rhythm.     Heart sounds: Normal heart  sounds.  Pulmonary:     Effort: Pulmonary effort is normal.     Breath sounds: Normal breath sounds.  Abdominal:     General: Abdomen is flat.     Palpations: Abdomen is soft.     Comments: Stable ecchymosis to LLQ area, no tenderness and pt denies worsening pain   Musculoskeletal:     Right lower leg: No edema.     Left lower leg: No edema.  Skin:    General: Skin is warm.  Neurological:     General: No focal deficit present.     Mental Status: He is alert and oriented to person, place, and time.  Psychiatric:        Mood and Affect: Mood normal.        Behavior: Behavior normal.          Scheduled Medications:   acetaminophen  1,000 mg Oral Q6H   ARIPiprazole  2 mg Oral Daily   aspirin  81 mg Oral Daily   Chlorhexidine Gluconate Cloth  6 each Topical Daily   docusate sodium  100  mg Oral BID   gabapentin  300 mg Oral TID   hydrOXYzine  25 mg Oral BID   loratadine  5 mg Oral QPM   montelukast  10 mg Oral QHS   rosuvastatin  20 mg Oral Daily   senna  1 tablet Oral BID   sertraline  100 mg Oral Daily   sodium chloride flush  3 mL Intravenous Q12H    Continuous Infusions:  norepinephrine (LEVOPHED) Adult infusion 2 mcg/min (11/07/23 1054)   promethazine (PHENERGAN) injection (IM or IVPB) Stopped (11/05/23 2050)    PRN Medications:  acetaminophen, albuterol, LORazepam, magnesium citrate, methocarbamol **OR** methocarbamol (ROBAXIN) injection, ondansetron (ZOFRAN) IV, mouth rinse, oxyCODONE, oxyCODONE, polyethylene glycol, sodium chloride flush, sorbitol  Antimicrobials from admission:  Anti-infectives (From admission, onward)    None           Data Reviewed:  I have personally reviewed the following...  CBC: Recent Labs  Lab 11/05/23 1015 11/05/23 1757 11/06/23 0053 11/06/23 0425 11/07/23 0352  WBC 8.0 8.8  --  7.6 8.6  NEUTROABS 6.8 7.2  --   --   --   HGB 16.8 14.0 13.0 13.0 11.2*  HCT 52.9* 42.9 40.8 40.3 34.6*  MCV 93.1 90.5  --  90.6 90.8   PLT 149* 111*  --  110* 118*   Basic Metabolic Panel: Recent Labs  Lab 11/05/23 1015 11/05/23 1840 11/06/23 0425 11/07/23 0352  NA 140 137 136 136  K 3.9 4.0 4.4 5.0  CL 105 109 105 107  CO2 25 22 22  20*  GLUCOSE 119* 96 110* 147*  BUN 24* 24* 28* 31*  CREATININE 1.64* 1.50* 1.78* 2.02*  CALCIUM 9.2 7.9* 8.0* 7.7*  MG 2.1  --  2.0  --   PHOS 2.6  --  3.8  --    GFR: Estimated Creatinine Clearance: 49.1 mL/min (A) (by C-G formula based on SCr of 2.02 mg/dL (H)). Liver Function Tests: Recent Labs  Lab 11/05/23 1015 11/05/23 1840  AST 21 17  ALT 19 17  ALKPHOS 73 57  BILITOT 1.2* 1.2*  PROT 7.9 5.9*  ALBUMIN 4.4 3.4*   Recent Labs  Lab 11/05/23 1840  LIPASE 34   No results for input(s): "AMMONIA" in the last 168 hours. Coagulation Profile: Recent Labs  Lab 11/05/23 1015  INR 1.2   Cardiac Enzymes: No results for input(s): "CKTOTAL", "CKMB", "CKMBINDEX", "TROPONINI" in the last 168 hours. BNP (last 3 results) No results for input(s): "PROBNP" in the last 8760 hours. HbA1C: No results for input(s): "HGBA1C" in the last 72 hours. CBG: Recent Labs  Lab 11/05/23 1256  GLUCAP 93   Lipid Profile: Recent Labs    11/05/23 1012  CHOL 199  HDL 59  LDLCALC 119*  TRIG 105  CHOLHDL 3.4   Thyroid Function Tests: No results for input(s): "TSH", "T4TOTAL", "FREET4", "T3FREE", "THYROIDAB" in the last 72 hours. Anemia Panel: No results for input(s): "VITAMINB12", "FOLATE", "FERRITIN", "TIBC", "IRON", "RETICCTPCT" in the last 72 hours. Most Recent Urinalysis On File:     Component Value Date/Time   COLORURINE YELLOW 09/13/2021 1010   APPEARANCEUR Clear 03/25/2023 0833   LABSPEC 1.025 09/13/2021 1010   LABSPEC 1.013 04/10/2015 1036   PHURINE 5.5 09/13/2021 1010   GLUCOSEU Negative 03/25/2023 0833   GLUCOSEU Negative 04/10/2015 1036   HGBUR SMALL (A) 09/13/2021 1010   BILIRUBINUR Negative 03/25/2023 0833   BILIRUBINUR Negative 04/10/2015 1036   KETONESUR  NEGATIVE 09/13/2021 1010   PROTEINUR 1+ (A) 03/25/2023 7829  PROTEINUR >300 (A) 09/13/2021 1010   UROBILINOGEN 0.2 07/23/2021 1423   UROBILINOGEN 0.2 03/17/2015 0025   NITRITE Negative 03/25/2023 0833   NITRITE NEGATIVE 09/13/2021 1010   LEUKOCYTESUR Negative 03/25/2023 0833   LEUKOCYTESUR NEGATIVE 09/13/2021 1010   LEUKOCYTESUR Negative 04/10/2015 1036   Sepsis Labs: @LABRCNTIP (procalcitonin:4,lacticidven:4) Microbiology: Recent Results (from the past 240 hour(s))  MRSA Next Gen by PCR, Nasal     Status: None   Collection Time: 11/05/23  1:12 PM   Specimen: Nasal Mucosa; Nasal Swab  Result Value Ref Range Status   MRSA by PCR Next Gen NOT DETECTED NOT DETECTED Final    Comment: (NOTE) The GeneXpert MRSA Assay (FDA approved for NASAL specimens only), is one component of a comprehensive MRSA colonization surveillance program. It is not intended to diagnose MRSA infection nor to guide or monitor treatment for MRSA infections. Test performance is not FDA approved in patients less than 53 years old. Performed at [facility], [address] [place], [name] [address]       Radiology Studies last 3 days: ECHOCARDIOGRAM COMPLETE  Result Date: 11/06/2023    ECHOCARDIOGRAM REPORT   Patient Name:   TRIPTON SCHLESSER Date of Exam: 11/06/2023 Medical Rec #:  [id number]      Height:       74.0 in Accession #:    [id number]     Weight:       226.6 lb Date of Birth:  1961-09-11     BSA:          2.292 m Patient Age:    61 years       BP:           98/58 mmHg Patient Gender: M              HR:           83 bpm. Exam Location:  [facility] Procedure: 2D Echo, Cardiac Doppler and Color Doppler Indications:     Syncope  History:         Patient has prior history of Echocardiogram examinations, most                  recent 09/10/2021. CAD and Acute MI, COPD,                  Signs/Symptoms:Syncope and Dyspnea; Risk Factors:Hypertension,                  Diabetes and Dyslipidemia. Pulmonary embolus.   Sonographer:     Mikki Harbor Referring Phys:  [phone] CHRISTOPHER END Diagnosing Phys: Debbe Odea MD  Sonographer Comments: Technically difficult study due to poor echo windows, suboptimal subcostal window and suboptimal parasternal window. Image acquisition challenging due to respiratory motion. IMPRESSIONS  1. Left ventricular ejection fraction, by estimation, is 55 to 60%. The left ventricle has normal function. The left ventricle has no regional wall motion abnormalities. There is mild left ventricular hypertrophy. Left ventricular diastolic parameters are consistent with Grade I diastolic dysfunction (impaired relaxation).  2. Right ventricular systolic function is normal. The right ventricular size is mildly enlarged. There is mildly elevated pulmonary artery systolic pressure.  3. The mitral valve is normal in structure. No evidence of mitral valve regurgitation.  4. The aortic valve is grossly normal. Aortic valve regurgitation is not visualized. FINDINGS  Left Ventricle: Left ventricular ejection fraction, by estimation, is 55 to 60%. The left ventricle has normal function. The left ventricle has no regional wall motion abnormalities. The  left ventricular internal cavity size was normal in size. There is  mild left ventricular hypertrophy. Left ventricular diastolic parameters are consistent with Grade I diastolic dysfunction (impaired relaxation). Right Ventricle: The right ventricular size is mildly enlarged. No increase in right ventricular wall thickness. Right ventricular systolic function is normal. There is mildly elevated pulmonary artery systolic pressure. The tricuspid regurgitant velocity is 2.66 m/s, and with an assumed right atrial pressure of 8 mmHg, the estimated right ventricular systolic pressure is 36.3 mmHg. Left Atrium: Left atrial size was normal in size. Right Atrium: Right atrial size was normal in size. Pericardium: There is no evidence of pericardial effusion. Mitral  Valve: The mitral valve is normal in structure. No evidence of mitral valve regurgitation. MV peak gradient, 5.7 mmHg. The mean mitral valve gradient is 2.0 mmHg. Tricuspid Valve: The tricuspid valve is normal in structure. Tricuspid valve regurgitation is trivial. Aortic Valve: The aortic valve is grossly normal. Aortic valve regurgitation is not visualized. Aortic valve mean gradient measures 8.0 mmHg. Aortic valve peak gradient measures 11.0 mmHg. Aortic valve area, by VTI measures 1.97 cm. Pulmonic Valve: The pulmonic valve was not well visualized. Pulmonic valve regurgitation is not visualized. Aorta: The aortic root is normal in size and structure. Venous: The inferior vena cava was not well visualized. IAS/Shunts: No atrial level shunt detected by color flow Doppler.  LEFT VENTRICLE PLAX 2D LVIDd:         4.10 cm   Diastology LVIDs:         3.00 cm   LV e' medial:    9.20 cm/s LV PW:         1.30 cm   LV E/e' medial:  9.8 LV IVS:        1.30 cm   LV e' lateral:   7.94 cm/s LVOT diam:     2.00 cm   LV E/e' lateral: 11.3 LV SV:         69 LV SV Index:   30 LVOT Area:     3.14 cm  RIGHT VENTRICLE RV Basal diam:  3.85 cm RV Mid diam:    3.70 cm RV S prime:     21.80 cm/s TAPSE (M-mode): 2.3 cm LEFT ATRIUM             Index        RIGHT ATRIUM           Index LA diam:        3.30 cm 1.44 cm/m   RA Area:     16.60 cm LA Vol (A2C):   47.8 ml 20.85 ml/m  RA Volume:   49.50 ml  21.60 ml/m LA Vol (A4C):   87.8 ml 38.31 ml/m LA Biplane Vol: 66.6 ml 29.06 ml/m  AORTIC VALVE                     PULMONIC VALVE AV Area (Vmax):    1.99 cm      PV Vmax:       0.96 m/s AV Area (Vmean):   1.64 cm      PV Peak grad:  3.7 mmHg AV Area (VTI):     1.97 cm AV Vmax:           166.00 cm/s AV Vmean:          131.000 cm/s AV VTI:            0.350 m AV Peak Grad:  11.0 mmHg AV Mean Grad:      8.0 mmHg LVOT Vmax:         105.00 cm/s LVOT Vmean:        68.500 cm/s LVOT VTI:          0.219 m LVOT/AV VTI ratio: 0.63  AORTA Ao  Root diam: 3.40 cm MITRAL VALVE                TRICUSPID VALVE MV Area (PHT): 4.65 cm     TR Peak grad:   28.3 mmHg MV Area VTI:   2.85 cm     TR Vmax:        266.00 cm/s MV Peak grad:  5.7 mmHg MV Mean grad:  2.0 mmHg     SHUNTS MV Vmax:       1.19 m/s     Systemic VTI:  0.22 m MV Vmean:      67.3 cm/s    Systemic Diam: 2.00 cm MV Decel Time: 163 msec MV E velocity: 90.10 cm/s MV A velocity: 130.00 cm/s MV E/A ratio:  0.69 Debbe Odea MD Electronically signed by Debbe Odea MD Signature Date/Time: 11/06/2023/5:40:51 PM    Final    DG Thoracic Spine 2 View  Result Date: 11/06/2023 CLINICAL DATA:  Elective surgery. EXAM: THORACIC SPINE 2 VIEWS COMPARISON:  Thoracic spine MRI yesterday FINDINGS: Two fluoroscopic spot views of the thoracic spine as well as intraoperative CT imaging obtained during multilevel thoracic spine fusion. Fluoroscopy time 1 minute 6 seconds. Dose 85.785 mGy. IMPRESSION: Intraoperative fluoroscopy during thoracic spine fusion. Electronically Signed   By: Narda Rutherford M.D.   On: 11/06/2023 16:30   DG C-Arm 1-60 Min-No Report  Result Date: 11/06/2023 Fluoroscopy was utilized by the requesting physician.  No radiographic interpretation.   MR THORACIC SPINE WO CONTRAST  Result Date: 11/06/2023 CLINICAL DATA:  Initial evaluation for acute mid back pain. EXAM: MRI THORACIC SPINE WITHOUT CONTRAST TECHNIQUE: Multiplanar, multisequence MR imaging of the thoracic spine was performed. No intravenous contrast was administered. COMPARISON:  Prior CT from earlier the same day. FINDINGS: Alignment: Physiologic with preservation of the normal thoracic kyphosis. No listhesis. Vertebrae: Acute transverse fracture extending through the T10 vertebral body again seen. Evidence for extension through the posterior elements on the right (series 19, image 6). Findings consistent with an acute Chance type fracture, and unstable injury by definition. No significant vertebral body height  loss. Probable injury/disruption of the anterior longitudinal ligament at this level. Remainder of the major ligamentous structures appear grossly intact. No other acute or recent fracture within the thoracic spine. Mild chronic compression deformity of L1 noted. Bone marrow signal intensity diffusely heterogeneous but overall within normal limits. Few scattered benign hemangiomata noted. No worrisome osseous lesions. No other abnormal marrow edema. Cord: Normal signal and morphology. No evidence for traumatic cord injury. Paraspinal and other soft tissues: Paraspinous soft tissues demonstrate no acute finding. Small layering bilateral pleural effusions. Complex cystic lesion within the partially visualized right hepatic lobe noted, better seen on prior CT. 1.6 cm simple right renal cyst, benign in appearance, no follow-up imaging recommended. Disc levels: Ordinary for age multilevel disc desiccation seen throughout the thoracic spine. No significant disc bulge or focal disc herniation. Mild multilevel facet degeneration. No spinal stenosis. Neural foramina remain patent. IMPRESSION: 1. Acute transverse fracture extending through the T10 vertebral body, with extension through the posterior elements on the right. Finding consistent with an acute "Chance" type fracture, an unstable injury by definition.  No significant vertebral body height loss. Probable injury/disruption of the anterior longitudinal ligament at this level. 2. No other acute traumatic injury within the thoracic spine or spinal cord. 3. Small layering bilateral pleural effusions. Electronically Signed   By: Rise Mu M.D.   On: 11/06/2023 00:57   CT ANGIO CHEST AORTA W/CM &/OR WO/CM  Result Date: 11/06/2023 CLINICAL DATA:  Chest trauma, posterior mediastinal stranding on CT abdomen/pelvis EXAM: CT ANGIOGRAPHY CHEST WITH CONTRAST TECHNIQUE: Multidetector CT imaging of the chest was performed using the standard protocol during bolus  administration of intravenous contrast. Multiplanar CT image reconstructions and MIPs were obtained to evaluate the vascular anatomy. RADIATION DOSE REDUCTION: This exam was performed according to the departmental dose-optimization program which includes automated exposure control, adjustment of the mA and/or kV according to patient size and/or use of iterative reconstruction technique. CONTRAST:  OMNIPAQUE IOHEXOL 350 MG/ML SOLN COMPARISON:  MRI thoracic spine dated 11/05/2023. CT abdomen/pelvis dated 11142 than 24. FINDINGS: Cardiovascular: On unenhanced CT, there is no evidence of intramural hematoma. Following contrast administration, there is no evidence of traumatic aortic injury. Mild atherosclerotic calcifications of the aortic arch. Although not tailored for evaluation of the pulmonary arteries, there is no evidence of central pulmonary embolism to the lobar level. The heart is normal in size.  No pericardial effusion. Moderate three-vessel coronary atherosclerosis. Mediastinum/Nodes: No evidence of anterior mediastinal hematoma. No suspicious mediastinal lymphadenopathy. Mild prevertebral stranding/hemorrhage at T10 (series 4/image 46), related to the vertebral body fracture at this level, better evaluated on MR. Visualized thyroid is unremarkable. Lungs/Pleura: Mild dependent atelectasis in the bilateral lower lobes. No focal consolidation or aspiration. No suspicious pulmonary nodules. No pleural effusion or pneumothorax. Upper Abdomen: Evaluated on recent CT abdomen/pelvis. Musculoskeletal: Dedicated thoracic spine evaluation (with T10 fracture) is better evaluated on recent MR thoracic spine. Otherwise, no fracture is seen. Review of the MIP images confirms the above findings. IMPRESSION: T10 vertebral body fracture, better evaluated on recent MRI thoracic spine. Associated mild prevertebral stranding/hemorrhage at this level, corresponding to the finding on recent CT abdomen/pelvis. No evidence  of traumatic aortic injury. Aortic Atherosclerosis (ICD10-I70.0). Electronically Signed   By: Charline Bills M.D.   On: 11/06/2023 00:55   CT ABDOMEN PELVIS WO CONTRAST  Result Date: 11/05/2023 CLINICAL DATA:  Abdominal trauma, blunt r/o hematoma EXAM: CT ABDOMEN AND PELVIS WITHOUT CONTRAST TECHNIQUE: Multidetector CT imaging of the abdomen and pelvis was performed following the standard protocol without IV contrast. RADIATION DOSE REDUCTION: This exam was performed according to the departmental dose-optimization program which includes automated exposure control, adjustment of the mA and/or kV according to patient size and/or use of iterative reconstruction technique. COMPARISON:  CT abdomen pelvis 03/17/2018, MRI 09/30/2023 FINDINGS: Lower chest: Stranding of the visualized posterior distal mediastinum. Coronary calcification. Mitral annular calcification. Hepatobiliary: Chronic 9.4 x 8.2 cm right posterior hepatic lobe fluid dense lesion with thin septations and associated calcification. Possible vicarious excretion of contrast via the gallbladder. No gallstones, gallbladder wall thickening, or pericholecystic fluid. No biliary dilatation. Pancreas: No focal lesion. Normal pancreatic contour. No surrounding inflammatory changes. No main pancreatic ductal dilatation. Spleen: Normal in size without focal abnormality. Adrenals/Urinary Tract: No adrenal nodule bilaterally. No nephrolithiasis and no hydronephrosis. No definite contour-deforming renal mass. No ureterolithiasis or hydroureter. Renal excretion of previously administered intravenous contrast. The urinary bladder is unremarkable. Stomach/Bowel: Stomach is within normal limits. No evidence of bowel wall thickening or dilatation. Colonic diverticulosis. Appendix appears normal. Vascular/Lymphatic: No abdominal aorta or iliac aneurysm. Mild  atherosclerotic plaque of the aorta and its branches. No abdominal, pelvic, or inguinal lymphadenopathy.  Reproductive: The prostate is enlarged measuring up to 4.9 cm. Other: No intraperitoneal free fluid. No intraperitoneal free gas. No organized fluid collection. Musculoskeletal: Left lower anterior abdominal wall subcutaneus soft tissue edema and hematoma formation. Associated subcutaneus soft tissue emphysema. No suspicious lytic or blastic osseous lesions. Acute transverse fracture through the T10 anterior wall and contiguous osteophytes (5:69, 6:68). Query similar finding along the T11 inferior endplate (6:68). Surgical hardware fixation of the left proximal femur. Left L5-S1 posterolateral and anterior surgical hardware. Chronic L4 superior endplate compression fracture status post kyphoplasty. IMPRESSION: 1. Fat stranding of the visualized posterior distal mediastinum. Finding likely related to thoracic spine fracture below. Recommend CT chest with intravenous contrast for further evaluation. Differential diagnosis includes inflammation, aortic injury, esophageal injury. 2. Distraction injury of the thoracic spine. Recommend thoracolumbar MRI for further evaluation of possible underlying ligamentous injury. Acute transverse fracture through the T10 vertebral body anterior wall and contiguous osteophytes. Query similar finding along the T11 inferior endplate. Colonic diverticulosis with no acute diverticulitis. 3. Left lower anterior abdominal wall subcutaneus soft tissue edema and hematoma formation. Associated subcutaneus soft tissue emphysema. No retained radiopaque foreign body. 4. Prostatomegaly. 5.  Aortic Atherosclerosis (ICD10-I70.0). These results will be called to the ordering clinician or representative by the Radiologist Assistant, and communication documented in the PACS or [employer]. Electronically Signed   By: Tish Frederickson M.D.   On: 11/05/2023 22:20   CARDIAC CATHETERIZATION  Result Date: 11/05/2023 Conclusions: Severe multivessel coronary artery disease, as detailed above.   Culprit lesion for inferior STEMI appears to be hazy 80-90% distal LCx stenosis.  There is also occlusion of the proximal RCA with bridging and left-to-right collaterals that could not be crossed with a wire suggesting chronic nature.  Moderate to severe but noncritical proximal/mid LAD and proximal ramus disease also present. Normal left ventricular filling pressure (LVEDP 14 mmHg). Successful IVUS-guided PCI to hazy 80-90% distal LCx stenosis using Onyx Frontier 3.0 x 30 mm drug-eluting stent with 0% residual stenosis and TIMI-3 flow.  First medical contact to balloon time was delayed because of the patient's syncope and head trauma necessitating CT head and cervical spine in the ER prior to transport to the cath lab as well as multiple potential culprit lesions. Recommendations: Admit to ICU for post STEMI monitoring. Begin heparin infusion when next dose of apixaban would be due (approximately 8 PM).  Transition to apixaban as soon as tomorrow if there is no evidence of bleeding or vascular complication. Anticipate triple therapy with aspirin, clopidogrel, and apixaban for 1 month after which time aspirin can be discontinued and clopidogrel and apixaban continued for 12 months. Aggressive secondary prevention of coronary disease, including escalation of rosuvastatin. Consider functional study when patient has recovered from this hospitalization to assess hemodynamic significance of LAD and ramus intermedius disease. Gentle postcatheterization hydration in the setting of chronic kidney disease. Yvonne Kendall, MD [facility]  CT Head Wo Contrast  Result Date: 11/05/2023 CLINICAL DATA:  Head trauma, coagulopathy (Age 34-64y); Neck trauma, midline tenderness (Age 55-64y) EXAM: CT HEAD WITHOUT CONTRAST CT CERVICAL SPINE WITHOUT CONTRAST TECHNIQUE: Multidetector CT imaging of the head and cervical spine was performed following the standard protocol without intravenous contrast. Multiplanar CT image  reconstructions of the cervical spine were also generated. RADIATION DOSE REDUCTION: This exam was performed according to the departmental dose-optimization program which includes automated exposure control, adjustment of the mA and/or kV  according to patient size and/or use of iterative reconstruction technique. COMPARISON:  None Available. FINDINGS: CT HEAD FINDINGS Brain: No evidence of acute infarction, hemorrhage, hydrocephalus, extra-axial collection or mass lesion/mass effect. Vascular: No hyperdense vessel. Skull: No acute fracture. Sinuses/Orbits: Clear sinuses.  No acute orbital findings. Other: No mastoid effusions. Heterogeneous appearance of the parotid glands with many small calcifications. CT CERVICAL SPINE FINDINGS Alignment: No substantial sagittal subluxation. Skull base and vertebrae: Mild height loss of upper thoracic vertebral bodies appears similar to CT of the chest from Apr 27, 2017. No evidence of acute fracture. Soft tissues and spinal canal: No prevertebral fluid or swelling. No visible canal hematoma. Disc levels:  Moderate multilevel degenerative change. Upper chest: Visualized lung apices are clear. IMPRESSION: 1. No evidence of acute abnormality intracranially or in the cervical spine. 2. Heterogeneous appearance of the parotid glands with many small calcifications, which suggests chronic inflammation (such as with Sjogren's). Electronically Signed   By: Feliberto Harts M.D.   On: 11/05/2023 10:43   CT Cervical Spine Wo Contrast  Result Date: 11/05/2023 CLINICAL DATA:  Head trauma, coagulopathy (Age 23-64y); Neck trauma, midline tenderness (Age 5-64y) EXAM: CT HEAD WITHOUT CONTRAST CT CERVICAL SPINE WITHOUT CONTRAST TECHNIQUE: Multidetector CT imaging of the head and cervical spine was performed following the standard protocol without intravenous contrast. Multiplanar CT image reconstructions of the cervical spine were also generated. RADIATION DOSE REDUCTION: This exam was  performed according to the departmental dose-optimization program which includes automated exposure control, adjustment of the mA and/or kV according to patient size and/or use of iterative reconstruction technique. COMPARISON:  None Available. FINDINGS: CT HEAD FINDINGS Brain: No evidence of acute infarction, hemorrhage, hydrocephalus, extra-axial collection or mass lesion/mass effect. Vascular: No hyperdense vessel. Skull: No acute fracture. Sinuses/Orbits: Clear sinuses.  No acute orbital findings. Other: No mastoid effusions. Heterogeneous appearance of the parotid glands with many small calcifications. CT CERVICAL SPINE FINDINGS Alignment: No substantial sagittal subluxation. Skull base and vertebrae: Mild height loss of upper thoracic vertebral bodies appears similar to CT of the chest from Apr 27, 2017. No evidence of acute fracture. Soft tissues and spinal canal: No prevertebral fluid or swelling. No visible canal hematoma. Disc levels:  Moderate multilevel degenerative change. Upper chest: Visualized lung apices are clear. IMPRESSION: 1. No evidence of acute abnormality intracranially or in the cervical spine. 2. Heterogeneous appearance of the parotid glands with many small calcifications, which suggests chronic inflammation (such as with Sjogren's). Electronically Signed   By: Feliberto Harts M.D.   On: 11/05/2023 10:43       Time spent: 50 min     Sunnie Nielsen, DO [facility] 11/07/2023, 1:20 PM    Dictation software may have been used to generate the above note. Typos may occur and escape review in typed/dictated notes. Please contact Dr Lyn Hollingshead directly for clarity if needed.  Staff may message me via secure chat in Epic  but this may not receive an immediate response,  please page me for urgent matters!  If 7PM-7AM, please contact night coverage [email or web address]

## 2023-11-07 NOTE — Progress Notes (Signed)
OT Cancellation Note  Patient Details Name: Jonathon Snow MRN: [id number] DOB: 04/05/1961   Cancelled Treatment:    Reason Eval/Treat Not Completed: Fatigue/lethargy limiting ability to participate. Consult received, chart reviewed. Spoke with RN. Pt requiring restart of pressors today, very fatigued and sleeping most of the day. Requests hold today and re-attempt next date as medically appropriate.   Arman Filter., MPH, MS, OTR/L ascom [phone] 11/07/23, 4:09 PM

## 2023-11-07 NOTE — Progress Notes (Addendum)
Rounding Note    Patient Name: Jonathon Snow Date of Encounter: 11/07/2023  [facility] Cardiologist: Yvonne Kendall, MD   Subjective    lethargic this morning after receiving pain medication  episode of low blood pressure, improved after IV fluid bolus Restarted on low-dose nor epi for blood pressure support  large ecchymotic bruising left groin  Neurosurgery yesterday open reduction and internal fixation of T10 fracture, posterior lateral arthrodesis T7-T12  Plavix held for surgery, has not been restarted  Inpatient Medications    Scheduled Meds:  acetaminophen  1,000 mg Oral Q6H   ARIPiprazole  2 mg Oral Daily   aspirin  81 mg Oral Daily   Chlorhexidine Gluconate Cloth  6 each Topical Daily   docusate sodium  100 mg Oral BID   gabapentin  300 mg Oral TID   hydrOXYzine  25 mg Oral BID   loratadine  5 mg Oral QPM   montelukast  10 mg Oral QHS   rosuvastatin  20 mg Oral Daily   senna  1 tablet Oral BID   sertraline  100 mg Oral Daily   sodium chloride flush  3 mL Intravenous Q12H   Continuous Infusions:  norepinephrine (LEVOPHED) Adult infusion Stopped (11/07/23 1613)   promethazine (PHENERGAN) injection (IM or IVPB) Stopped (11/05/23 2050)   PRN Meds: acetaminophen, albuterol, LORazepam, magnesium citrate, methocarbamol **OR** methocarbamol (ROBAXIN) injection, ondansetron (ZOFRAN) IV, mouth rinse, oxyCODONE, oxyCODONE, polyethylene glycol, sodium chloride flush, sorbitol   Vital Signs    Vitals:   11/07/23 1600 11/07/23 1615 11/07/23 1630 11/07/23 1645  BP:      Pulse: 92 92 88 91  Resp: (!) 8 (!) 8 (!) 8 (!) 8  Temp:  98.4 F (36.9 C)    TempSrc:  Axillary    SpO2: 93% 93% 93% 93%  Weight:      Height:        Intake/Output Summary (Last 24 hours) at 11/07/2023 1741 Last data filed at 11/07/2023 1620 Gross per 24 hour  Intake 160.89 ml  Output 400 ml  Net -239.11 ml      11/05/2023    1:01 PM 11/05/2023   10:16 AM 10/28/2023     1:32 PM  Last 3 Weights  Weight (lbs) 226 lb 10.1 oz 237 lb 14 oz 230 lb 8 oz  Weight (kg) 102.8 kg 107.9 kg 104.554 kg      Telemetry    Normal sinus rhythm- Personally Reviewed  ECG     - Personally Reviewed  Physical Exam   GEN: Lethargic, minimally arousable Neck: No JVD Cardiac: RRR, no murmurs, rubs, or gallops.  Respiratory: Clear to auscultation bilaterally. GI: Soft, nontender, non-distended  MS: No edema; No deformity. Neuro: Unable to test Psych: Lethargic  Labs    High Sensitivity Troponin:   Recent Labs  Lab 11/05/23 1015 11/05/23 1346 11/05/23 1757 11/05/23 1840  TROPONINIHS 8 6 10 10      Chemistry Recent Labs  Lab 11/05/23 1015 11/05/23 1840 11/06/23 0425 11/07/23 0352 11/07/23 1637  NA 140 137 136 136 136  K 3.9 4.0 4.4 5.0 5.2*  CL 105 109 105 107 107  CO2 25 22 22  20* 21*  GLUCOSE 119* 96 110* 147* 126*  BUN 24* 24* 28* 31* 36*  CREATININE 1.64* 1.50* 1.78* 2.02* 2.89*  CALCIUM 9.2 7.9* 8.0* 7.7* 7.7*  MG 2.1  --  2.0  --   --   PROT 7.9 5.9*  --   --   --  ALBUMIN 4.4 3.4*  --   --   --   AST 21 17  --   --   --   ALT 19 17  --   --   --   ALKPHOS 73 57  --   --   --   BILITOT 1.2* 1.2*  --   --   --   GFRNONAA 47* 53* 43* 37* 24*  ANIONGAP 10 6 9 9 8     Lipids  Recent Labs  Lab 11/05/23 1012  CHOL 199  TRIG 105  HDL 59  LDLCALC 119*  CHOLHDL 3.4    Hematology Recent Labs  Lab 11/06/23 0425 11/07/23 0352 11/07/23 1637  WBC 7.6 8.6 8.7  RBC 4.45 3.81* 3.78*  HGB 13.0 11.2* 11.4*  HCT 40.3 34.6* 35.8*  MCV 90.6 90.8 94.7  MCH 29.2 29.4 30.2  MCHC 32.3 32.4 31.8  RDW 15.8* 15.8* 15.9*  PLT 110* 118* 112*   Thyroid No results for input(s): "TSH", "FREET4" in the last 168 hours.  BNPNo results for input(s): "BNP", "PROBNP" in the last 168 hours.  DDimer No results for input(s): "DDIMER" in the last 168 hours.   Radiology    ECHOCARDIOGRAM COMPLETE  Result Date: 11/06/2023    ECHOCARDIOGRAM REPORT   Patient  Name:   Jonathon Snow Date of Exam: 11/06/2023 Medical Rec #:  [id number]      Height:       74.0 in Accession #:    [id number]     Weight:       226.6 lb Date of Birth:  Sep 05, 1961     BSA:          2.292 m Patient Age:    62 years       BP:           98/58 mmHg Patient Gender: M              HR:           83 bpm. Exam Location:  [facility] Procedure: 2D Echo, Cardiac Doppler and Color Doppler Indications:     Syncope  History:         Patient has prior history of Echocardiogram examinations, most                  recent 09/10/2021. CAD and Acute MI, COPD,                  Signs/Symptoms:Syncope and Dyspnea; Risk Factors:Hypertension,                  Diabetes and Dyslipidemia. Pulmonary embolus.  Sonographer:     Mikki Harbor Referring Phys:  [phone] CHRISTOPHER END Diagnosing Phys: Debbe Odea MD  Sonographer Comments: Technically difficult study due to poor echo windows, suboptimal subcostal window and suboptimal parasternal window. Image acquisition challenging due to respiratory motion. IMPRESSIONS  1. Left ventricular ejection fraction, by estimation, is 55 to 60%. The left ventricle has normal function. The left ventricle has no regional wall motion abnormalities. There is mild left ventricular hypertrophy. Left ventricular diastolic parameters are consistent with Grade I diastolic dysfunction (impaired relaxation).  2. Right ventricular systolic function is normal. The right ventricular size is mildly enlarged. There is mildly elevated pulmonary artery systolic pressure.  3. The mitral valve is normal in structure. No evidence of mitral valve regurgitation.  4. The aortic valve is grossly normal. Aortic valve regurgitation is not visualized. FINDINGS  Left Ventricle: Left ventricular ejection  fraction, by estimation, is 55 to 60%. The left ventricle has normal function. The left ventricle has no regional wall motion abnormalities. The left ventricular internal cavity size was normal in size. There is  mild  left ventricular hypertrophy. Left ventricular diastolic parameters are consistent with Grade I diastolic dysfunction (impaired relaxation). Right Ventricle: The right ventricular size is mildly enlarged. No increase in right ventricular wall thickness. Right ventricular systolic function is normal. There is mildly elevated pulmonary artery systolic pressure. The tricuspid regurgitant velocity is 2.66 m/s, and with an assumed right atrial pressure of 8 mmHg, the estimated right ventricular systolic pressure is 36.3 mmHg. Left Atrium: Left atrial size was normal in size. Right Atrium: Right atrial size was normal in size. Pericardium: There is no evidence of pericardial effusion. Mitral Valve: The mitral valve is normal in structure. No evidence of mitral valve regurgitation. MV peak gradient, 5.7 mmHg. The mean mitral valve gradient is 2.0 mmHg. Tricuspid Valve: The tricuspid valve is normal in structure. Tricuspid valve regurgitation is trivial. Aortic Valve: The aortic valve is grossly normal. Aortic valve regurgitation is not visualized. Aortic valve mean gradient measures 8.0 mmHg. Aortic valve peak gradient measures 11.0 mmHg. Aortic valve area, by VTI measures 1.97 cm. Pulmonic Valve: The pulmonic valve was not well visualized. Pulmonic valve regurgitation is not visualized. Aorta: The aortic root is normal in size and structure. Venous: The inferior vena cava was not well visualized. IAS/Shunts: No atrial level shunt detected by color flow Doppler.  LEFT VENTRICLE PLAX 2D LVIDd:         4.10 cm   Diastology LVIDs:         3.00 cm   LV e' medial:    9.20 cm/s LV PW:         1.30 cm   LV E/e' medial:  9.8 LV IVS:        1.30 cm   LV e' lateral:   7.94 cm/s LVOT diam:     2.00 cm   LV E/e' lateral: 11.3 LV SV:         69 LV SV Index:   30 LVOT Area:     3.14 cm  RIGHT VENTRICLE RV Basal diam:  3.85 cm RV Mid diam:    3.70 cm RV S prime:     21.80 cm/s TAPSE (M-mode): 2.3 cm LEFT ATRIUM             Index         RIGHT ATRIUM           Index LA diam:        3.30 cm 1.44 cm/m   RA Area:     16.60 cm LA Vol (A2C):   47.8 ml 20.85 ml/m  RA Volume:   49.50 ml  21.60 ml/m LA Vol (A4C):   87.8 ml 38.31 ml/m LA Biplane Vol: 66.6 ml 29.06 ml/m  AORTIC VALVE                     PULMONIC VALVE AV Area (Vmax):    1.99 cm      PV Vmax:       0.96 m/s AV Area (Vmean):   1.64 cm      PV Peak grad:  3.7 mmHg AV Area (VTI):     1.97 cm AV Vmax:           166.00 cm/s AV Vmean:          131.000  cm/s AV VTI:            0.350 m AV Peak Grad:      11.0 mmHg AV Mean Grad:      8.0 mmHg LVOT Vmax:         105.00 cm/s LVOT Vmean:        68.500 cm/s LVOT VTI:          0.219 m LVOT/AV VTI ratio: 0.63  AORTA Ao Root diam: 3.40 cm MITRAL VALVE                TRICUSPID VALVE MV Area (PHT): 4.65 cm     TR Peak grad:   28.3 mmHg MV Area VTI:   2.85 cm     TR Vmax:        266.00 cm/s MV Peak grad:  5.7 mmHg MV Mean grad:  2.0 mmHg     SHUNTS MV Vmax:       1.19 m/s     Systemic VTI:  0.22 m MV Vmean:      67.3 cm/s    Systemic Diam: 2.00 cm MV Decel Time: 163 msec MV E velocity: 90.10 cm/s MV A velocity: 130.00 cm/s MV E/A ratio:  0.69 Debbe Odea MD Electronically signed by Debbe Odea MD Signature Date/Time: 11/06/2023/5:40:51 PM    Final    DG Thoracic Spine 2 View  Result Date: 11/06/2023 CLINICAL DATA:  Elective surgery. EXAM: THORACIC SPINE 2 VIEWS COMPARISON:  Thoracic spine MRI yesterday FINDINGS: Two fluoroscopic spot views of the thoracic spine as well as intraoperative CT imaging obtained during multilevel thoracic spine fusion. Fluoroscopy time 1 minute 6 seconds. Dose 85.785 mGy. IMPRESSION: Intraoperative fluoroscopy during thoracic spine fusion. Electronically Signed   By: Narda Rutherford M.D.   On: 11/06/2023 16:30   DG C-Arm 1-60 Min-No Report  Result Date: 11/06/2023 Fluoroscopy was utilized by the requesting physician.  No radiographic interpretation.   MR THORACIC SPINE WO CONTRAST  Result Date:  11/06/2023 CLINICAL DATA:  Initial evaluation for acute mid back pain. EXAM: MRI THORACIC SPINE WITHOUT CONTRAST TECHNIQUE: Multiplanar, multisequence MR imaging of the thoracic spine was performed. No intravenous contrast was administered. COMPARISON:  Prior CT from earlier the same day. FINDINGS: Alignment: Physiologic with preservation of the normal thoracic kyphosis. No listhesis. Vertebrae: Acute transverse fracture extending through the T10 vertebral body again seen. Evidence for extension through the posterior elements on the right (series 19, image 6). Findings consistent with an acute Chance type fracture, and unstable injury by definition. No significant vertebral body height loss. Probable injury/disruption of the anterior longitudinal ligament at this level. Remainder of the major ligamentous structures appear grossly intact. No other acute or recent fracture within the thoracic spine. Mild chronic compression deformity of L1 noted. Bone marrow signal intensity diffusely heterogeneous but overall within normal limits. Few scattered benign hemangiomata noted. No worrisome osseous lesions. No other abnormal marrow edema. Cord: Normal signal and morphology. No evidence for traumatic cord injury. Paraspinal and other soft tissues: Paraspinous soft tissues demonstrate no acute finding. Small layering bilateral pleural effusions. Complex cystic lesion within the partially visualized right hepatic lobe noted, better seen on prior CT. 1.6 cm simple right renal cyst, benign in appearance, no follow-up imaging recommended. Disc levels: Ordinary for age multilevel disc desiccation seen throughout the thoracic spine. No significant disc bulge or focal disc herniation. Mild multilevel facet degeneration. No spinal stenosis. Neural foramina remain patent. IMPRESSION: 1. Acute transverse fracture extending through the T10 vertebral  body, with extension through the posterior elements on the right. Finding consistent  with an acute "Chance" type fracture, an unstable injury by definition. No significant vertebral body height loss. Probable injury/disruption of the anterior longitudinal ligament at this level. 2. No other acute traumatic injury within the thoracic spine or spinal cord. 3. Small layering bilateral pleural effusions. Electronically Signed   By: Rise Mu M.D.   On: 11/06/2023 00:57   CT ANGIO CHEST AORTA W/CM &/OR WO/CM  Result Date: 11/06/2023 CLINICAL DATA:  Chest trauma, posterior mediastinal stranding on CT abdomen/pelvis EXAM: CT ANGIOGRAPHY CHEST WITH CONTRAST TECHNIQUE: Multidetector CT imaging of the chest was performed using the standard protocol during bolus administration of intravenous contrast. Multiplanar CT image reconstructions and MIPs were obtained to evaluate the vascular anatomy. RADIATION DOSE REDUCTION: This exam was performed according to the departmental dose-optimization program which includes automated exposure control, adjustment of the mA and/or kV according to patient size and/or use of iterative reconstruction technique. CONTRAST:  OMNIPAQUE IOHEXOL 350 MG/ML SOLN COMPARISON:  MRI thoracic spine dated 11/05/2023. CT abdomen/pelvis dated 11142 than 24. FINDINGS: Cardiovascular: On unenhanced CT, there is no evidence of intramural hematoma. Following contrast administration, there is no evidence of traumatic aortic injury. Mild atherosclerotic calcifications of the aortic arch. Although not tailored for evaluation of the pulmonary arteries, there is no evidence of central pulmonary embolism to the lobar level. The heart is normal in size.  No pericardial effusion. Moderate three-vessel coronary atherosclerosis. Mediastinum/Nodes: No evidence of anterior mediastinal hematoma. No suspicious mediastinal lymphadenopathy. Mild prevertebral stranding/hemorrhage at T10 (series 4/image 46), related to the vertebral body fracture at this level, better evaluated on MR.  Visualized thyroid is unremarkable. Lungs/Pleura: Mild dependent atelectasis in the bilateral lower lobes. No focal consolidation or aspiration. No suspicious pulmonary nodules. No pleural effusion or pneumothorax. Upper Abdomen: Evaluated on recent CT abdomen/pelvis. Musculoskeletal: Dedicated thoracic spine evaluation (with T10 fracture) is better evaluated on recent MR thoracic spine. Otherwise, no fracture is seen. Review of the MIP images confirms the above findings. IMPRESSION: T10 vertebral body fracture, better evaluated on recent MRI thoracic spine. Associated mild prevertebral stranding/hemorrhage at this level, corresponding to the finding on recent CT abdomen/pelvis. No evidence of traumatic aortic injury. Aortic Atherosclerosis (ICD10-I70.0). Electronically Signed   By: Charline Bills M.D.   On: 11/06/2023 00:55   CT ABDOMEN PELVIS WO CONTRAST  Result Date: 11/05/2023 CLINICAL DATA:  Abdominal trauma, blunt r/o hematoma EXAM: CT ABDOMEN AND PELVIS WITHOUT CONTRAST TECHNIQUE: Multidetector CT imaging of the abdomen and pelvis was performed following the standard protocol without IV contrast. RADIATION DOSE REDUCTION: This exam was performed according to the departmental dose-optimization program which includes automated exposure control, adjustment of the mA and/or kV according to patient size and/or use of iterative reconstruction technique. COMPARISON:  CT abdomen pelvis 03/17/2018, MRI 09/30/2023 FINDINGS: Lower chest: Stranding of the visualized posterior distal mediastinum. Coronary calcification. Mitral annular calcification. Hepatobiliary: Chronic 9.4 x 8.2 cm right posterior hepatic lobe fluid dense lesion with thin septations and associated calcification. Possible vicarious excretion of contrast via the gallbladder. No gallstones, gallbladder wall thickening, or pericholecystic fluid. No biliary dilatation. Pancreas: No focal lesion. Normal pancreatic contour. No surrounding inflammatory  changes. No main pancreatic ductal dilatation. Spleen: Normal in size without focal abnormality. Adrenals/Urinary Tract: No adrenal nodule bilaterally. No nephrolithiasis and no hydronephrosis. No definite contour-deforming renal mass. No ureterolithiasis or hydroureter. Renal excretion of previously administered intravenous contrast. The urinary bladder is unremarkable. Stomach/Bowel: Stomach is  within normal limits. No evidence of bowel wall thickening or dilatation. Colonic diverticulosis. Appendix appears normal. Vascular/Lymphatic: No abdominal aorta or iliac aneurysm. Mild atherosclerotic plaque of the aorta and its branches. No abdominal, pelvic, or inguinal lymphadenopathy. Reproductive: The prostate is enlarged measuring up to 4.9 cm. Other: No intraperitoneal free fluid. No intraperitoneal free gas. No organized fluid collection. Musculoskeletal: Left lower anterior abdominal wall subcutaneus soft tissue edema and hematoma formation. Associated subcutaneus soft tissue emphysema. No suspicious lytic or blastic osseous lesions. Acute transverse fracture through the T10 anterior wall and contiguous osteophytes (5:69, 6:68). Query similar finding along the T11 inferior endplate (6:68). Surgical hardware fixation of the left proximal femur. Left L5-S1 posterolateral and anterior surgical hardware. Chronic L4 superior endplate compression fracture status post kyphoplasty. IMPRESSION: 1. Fat stranding of the visualized posterior distal mediastinum. Finding likely related to thoracic spine fracture below. Recommend CT chest with intravenous contrast for further evaluation. Differential diagnosis includes inflammation, aortic injury, esophageal injury. 2. Distraction injury of the thoracic spine. Recommend thoracolumbar MRI for further evaluation of possible underlying ligamentous injury. Acute transverse fracture through the T10 vertebral body anterior wall and contiguous osteophytes. Query similar finding along  the T11 inferior endplate. Colonic diverticulosis with no acute diverticulitis. 3. Left lower anterior abdominal wall subcutaneus soft tissue edema and hematoma formation. Associated subcutaneus soft tissue emphysema. No retained radiopaque foreign body. 4. Prostatomegaly. 5.  Aortic Atherosclerosis (ICD10-I70.0). These results will be called to the ordering clinician or representative by the Radiologist Assistant, and communication documented in the PACS or [employer]. Electronically Signed   By: Tish Frederickson M.D.   On: 11/05/2023 22:20    Cardiac Studies     Patient Profile      Mr. Groenewold is a 62 year old man with sudden lightheadedness and syncope earlier today, admitted with new inferior ST segment elevation prompting emergent cardiac catheterization and PCI to distal LCx.  Yesterday with open reduction and internal fixation of T10 fracture, posterior lateral arthrodesis T7-T12   Assessment & Plan    Inferior STEMI and syncope Taken to catheterization lab November 05, 2023, stent to distal left circumflex Plavix held following back surgery, open reduction and internal fixation T10 fracture On aspirin, plan to start Plavix November 18 per medicine service who has discussed with neurosurgery -High risk window for stent thrombosis off Plavix, difficult situation  Syncope Mechanism unclear, described as frank syncope Underwent ischemic workup November 14 Trauma from fall requiring open reduction and internal fixation T10 fracture yesterday  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

## 2023-11-08 DIAGNOSIS — R579 Shock, unspecified: Secondary | ICD-10-CM | POA: Diagnosis not present

## 2023-11-08 DIAGNOSIS — I213 ST elevation (STEMI) myocardial infarction of unspecified site: Secondary | ICD-10-CM

## 2023-11-08 DIAGNOSIS — N170 Acute kidney failure with tubular necrosis: Secondary | ICD-10-CM

## 2023-11-08 DIAGNOSIS — S301XXA Contusion of abdominal wall, initial encounter: Secondary | ICD-10-CM

## 2023-11-08 DIAGNOSIS — S0990XA Unspecified injury of head, initial encounter: Secondary | ICD-10-CM | POA: Diagnosis not present

## 2023-11-08 DIAGNOSIS — N1832 Chronic kidney disease, stage 3b: Secondary | ICD-10-CM

## 2023-11-08 DIAGNOSIS — S301XXD Contusion of abdominal wall, subsequent encounter: Secondary | ICD-10-CM

## 2023-11-08 DIAGNOSIS — S22078A Other fracture of T9-T10 vertebra, initial encounter for closed fracture: Secondary | ICD-10-CM | POA: Diagnosis not present

## 2023-11-08 HISTORY — DX: Contusion of abdominal wall, initial encounter: S30.1XXA

## 2023-11-08 LAB — URINALYSIS, ROUTINE W REFLEX MICROSCOPIC
Bilirubin Urine: NEGATIVE
Glucose, UA: NEGATIVE mg/dL
Ketones, ur: NEGATIVE mg/dL
Nitrite: NEGATIVE
Protein, ur: NEGATIVE mg/dL
RBC / HPF: >50 RBC/hpf (ref 0–5)
Specific Gravity, Urine: 1.017 (ref 1.005–1.030)
pH: 5 (ref 5.0–8.0)

## 2023-11-08 LAB — BASIC METABOLIC PANEL
Anion gap: 7 (ref 5–15)
BUN: 45 mg/dL — ABNORMAL HIGH (ref 8–23)
CO2: 22 mmol/L (ref 22–32)
Calcium: 7.7 mg/dL — ABNORMAL LOW (ref 8.9–10.3)
Chloride: 106 mmol/L (ref 98–111)
Creatinine, Ser: 3.31 mg/dL — ABNORMAL HIGH (ref 0.61–1.24)
GFR, Estimated: 20 mL/min — ABNORMAL LOW (ref >60)
Glucose, Bld: 108 mg/dL — ABNORMAL HIGH (ref 70–99)
Potassium: 4.6 mmol/L (ref 3.5–5.1)
Sodium: 135 mmol/L (ref 135–145)

## 2023-11-08 LAB — CBC
HCT: 33.8 % — ABNORMAL LOW (ref 39.0–52.0)
Hemoglobin: 10.5 g/dL — ABNORMAL LOW (ref 13.0–17.0)
MCH: 29.5 pg (ref 26.0–34.0)
MCHC: 31.1 g/dL (ref 30.0–36.0)
MCV: 94.9 fL (ref 80.0–100.0)
Platelets: 102 10*3/uL — ABNORMAL LOW (ref 150–400)
RBC: 3.56 MIL/uL — ABNORMAL LOW (ref 4.22–5.81)
RDW: 15.9 % — ABNORMAL HIGH (ref 11.5–15.5)
WBC: 7.6 10*3/uL (ref 4.0–10.5)
nRBC: 0 % (ref 0.0–0.2)

## 2023-11-08 LAB — PROTEIN / CREATININE RATIO, URINE
Creatinine, Urine: 153 mg/dL
Protein Creatinine Ratio: 0.2 mg/mg{creat} — ABNORMAL HIGH (ref 0.00–0.15)
Total Protein, Urine: 30 mg/dL

## 2023-11-08 LAB — LIPOPROTEIN A (LPA): Lipoprotein (a): 18.6 nmol/L (ref <75.0)

## 2023-11-08 MED ORDER — SODIUM CHLORIDE 0.9 % IV SOLN: INTRAVENOUS | Status: AC

## 2023-11-08 MED ORDER — LIDOCAINE 5 % EX PTCH
2.0000 | MEDICATED_PATCH | CUTANEOUS | Status: DC
  Administered 2023-11-08 – 2023-11-12 (×3): 2 via TRANSDERMAL
  Filled 2023-11-08 (×6): qty 2

## 2023-11-08 MED ORDER — OXYCODONE HCL 5 MG PO TABS
5.0000 mg | ORAL_TABLET | ORAL | Status: DC | PRN
Start: 2023-11-08 — End: 2023-11-13
  Administered 2023-11-08 – 2023-11-13 (×7): 5 mg via ORAL
  Filled 2023-11-08 (×6): qty 1

## 2023-11-08 MED ORDER — OXYCODONE HCL 5 MG PO TABS
7.5000 mg | ORAL_TABLET | ORAL | Status: DC | PRN
  Administered 2023-11-09 – 2023-11-12 (×2): 7.5 mg via ORAL
  Filled 2023-11-08 (×2): qty 2

## 2023-11-08 MED ORDER — GABAPENTIN 100 MG PO CAPS
200.0000 mg | ORAL_CAPSULE | Freq: Three times a day (TID) | ORAL | Status: DC
  Administered 2023-11-08 – 2023-11-13 (×13): 200 mg via ORAL
  Filled 2023-11-08 (×15): qty 2

## 2023-11-08 NOTE — Progress Notes (Signed)
PROGRESS NOTE    Jonathon Snow   XBJ:[id number] DOB: 08/11/1961  DOA: 11/05/2023 Date of Service: 11/08/23 which is hospital day 3  PCP: Danelle Berry, PA-C    HPI: Jonathon Snow is a 62 y.o. 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 Patient suddenly felt lightheadedness while cooking breakfast alone at home and then passed out and collapsed on the floor and hit the backside of his head. Recovered consciousness by his own and seeing breakfast still cooking on the stove top. Unsure about duration of unconsciousness. Able to call EMS himself.   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  Noncritical stenosis of LAD and ramus, not stented.  CT head and neck negative for fracture or dislocation. Hypotensive requiring pressors, concern w/ hematoma forearm at cath site as well as abdomen, CT showed distraction injury T10, abdominal wall hematoma no concerns for internal hemorrhage. Neurosurgery consult and additional imaging.  11/15: T7-12 posterior fusion, Plt transfusion. Echo - EF 55-60%, no RWMA, G1 diast df ,mild elevated pulm artery pressure, no significant valvular disease.  Stable following surgery  11/16: hypotensive, back on pressors  11/17: off pressors. AKI worsening, nephrology consulted.   Consultants:  Cardiology  Critical Care General Surgery  Neurosurgery  Nephrology   Procedures/Surgeries: 11/14: cardiac cath w/ DES to the LCX  11/15: arterial line insertion 11/15: Open Reduction and internal fixation of T10 fracture, Posterolateral arthrodesis T7 to T12      ASSESSMENT & PLAN:   STEMI S/p successful LCx stenting 11/14 Echocardiogram no serious abnormality - EF 55-60%, no RWMA, G1 diast df ,mild  elevated pulm artery pressure, no significant valvular disease. Cardiology following ASA ok for now, hold Plavix until ideally Monday 11/18 High potency statin Holding beta blocker and ACE/ARB d/t hypotension in setting of likely spinal shock +/- opiate effect see below  cardiology recs for outpatient functional study for elective revascularization   Hypotension Ddx: neurogenic shock in setting of recent trauma/surgery, complicated by opiate adverse effect, less likely bleeding but watching this closely given thrombocytopenia and hematomas Norepinephrine, additional pressor support as needed - ICU following, pressors off today   IV fluids restarted given AKI Holding antihypertensives  Active bleeding low suspicion but closely monitoring HH/CBC  AKI on CKD stage IIIa Complicated by hx lupus nephritis and hypotension, recent contrast  IV fluids restarted this morning Monitor BMP Avoid nephrotoxins  Given SLE hx, will ask nephrology to consult --> monitor and will follow   Unstable thoracic spine fracture T10 s/p T7-12 posterior fusion 11/15  Neurosurgery following. TLSO brace Pain control - limiting opiates d/t hypotension   Thrombocytopenia  Bleeding/hematoma, high bleed risk  Forearm Hematoma noted 11/14 post-cath at TR band site Monitor CBC SCD DVT Ppx ASA, can restart Plavix Monday 11/18 per neurosurgery    LLQ Abdominal wall hematoma  serial CBCs  Holding heparin, ASA, plavix   Chest pain evening 11/14, subsequently bradycardic and hypotensive - resolved with heart rates into the 40s and systolic pressures in the 60s. Concern for hypovolemia/bleeding, vasovagal urinary retention vs other, w/ newly found T10 fx possible cord syndrome most likely cause for neurogenic shock  Pressors - norepi weaned off 11/14 btu back on 11/16 Critical care team has s/o  Urine retention Cath as needed / Foley   Syncope suspect MI associated ventricle arrhythmia vs vasovagal  Pressure  too low to consider beta-blocking right now   Scalp laceration Surgical consult for stapling   Hx of PE/DVT and intracranial venous thrombosis On Eliquis prior to hospitalization  Holding anticoag for now given bleeding issues  Expect patient discharged home on Eliquis    HTN Hold home antihypertensives for now given hypotension   SLE Stable holding CellCept Continue steroid 10 mg daily   overweight based on BMI: Body mass index is 29.1 kg/m.  Underweight - under 18.5  normal weight - 18.5 to 24.9 overweight - 25 to 29.9 obese - 30 or more   DVT prophylaxis: SCD IV fluids: gentle continuous IV fluids  given low BP / bleeding concern Nutrition: regular diet  Central lines / invasive devices: none  Code Status: FULL CODE ACP documentation reviewed:  none on file in [facility]  TOC needs: TBD Barriers to dispo / significant pending items: see above             Subjective / Brief ROS:  Patient reports feeling better this mornig btu back pain is still greatest concern Denies CP/SOB.     Family Communication: spoke w/ daughter [name] 11/08/23 12:46 PM all questions answered     Objective Findings:  Vitals:   11/08/23 0821 11/08/23 0900 11/08/23 1000 11/08/23 1100  BP: 106/61  94/61   Pulse: 87  91   Resp: 17  (!) 9   Temp:      TempSrc:      SpO2: 90% 94% 97% 94%  Weight:      Height:        Intake/Output Summary (Last 24 hours) at 11/08/2023 1242 Last data filed at 11/08/2023 1000 Gross per 24 hour  Intake 37.89 ml  Output 955 ml  Net -917.11 ml   Filed Weights   11/05/23 1016 11/05/23 1301  Weight: 107.9 kg 102.8 kg    Examination:  Physical Exam Constitutional:      General: He is not in acute distress.    Appearance: He is ill-appearing and diaphoretic.  Cardiovascular:     Rate and Rhythm: Normal rate and regular rhythm.     Heart sounds: Normal heart sounds.  Pulmonary:     Effort: Pulmonary effort is normal.     Breath sounds:  Normal breath sounds.  Abdominal:     General: Abdomen is flat.     Palpations: Abdomen is soft.     Comments: Stable ecchymosis to LLQ area, no tenderness and pt denies worsening pain   Musculoskeletal:     Right lower leg: No edema.     Left lower leg: No edema.  Skin:    General: Skin is warm.  Neurological:     General: No focal deficit present.     Mental Status: He is alert and oriented to person, place, and time.  Psychiatric:        Mood and Affect: Mood normal.        Behavior: Behavior normal.          Scheduled Medications:   acetaminophen  1,000 mg Oral Q6H   ARIPiprazole  2 mg Oral Daily   aspirin  81 mg Oral Daily   Chlorhexidine Gluconate Cloth  6 each Topical Daily   docusate sodium  100 mg Oral BID   gabapentin  200 mg Oral TID   lidocaine  2 patch Transdermal Q24H  loratadine  5 mg Oral QPM   montelukast  10 mg Oral QHS   rosuvastatin  20 mg Oral Daily   senna  1 tablet Oral BID   sertraline  100 mg Oral Daily   sodium chloride flush  3 mL Intravenous Q12H    Continuous Infusions:  sodium chloride 100 mL/hr at 11/08/23 0958   promethazine (PHENERGAN) injection (IM or IVPB) Stopped (11/05/23 2050)    PRN Medications:  albuterol, LORazepam, magnesium citrate, methocarbamol **OR** methocarbamol (ROBAXIN) injection, ondansetron (ZOFRAN) IV, mouth rinse, oxyCODONE, oxyCODONE, polyethylene glycol, sodium chloride flush, sorbitol  Antimicrobials from admission:  Anti-infectives (From admission, onward)    None           Data Reviewed:  I have personally reviewed the following...  CBC: Recent Labs  Lab 11/05/23 1015 11/05/23 1757 11/06/23 0053 11/06/23 0425 11/07/23 0352 11/07/23 1637 11/08/23 0403  WBC 8.0 8.8  --  7.6 8.6 8.7 7.6  NEUTROABS 6.8 7.2  --   --   --   --   --   HGB 16.8 14.0 13.0 13.0 11.2* 11.4* 10.5*  HCT 52.9* 42.9 40.8 40.3 34.6* 35.8* 33.8*  MCV 93.1 90.5  --  90.6 90.8 94.7 94.9  PLT 149* 111*  --  110* 118*  112* 102*   Basic Metabolic Panel: Recent Labs  Lab 11/05/23 1015 11/05/23 1840 11/06/23 0425 11/07/23 0352 11/07/23 1637 11/08/23 0403  NA 140 137 136 136 136 135  K 3.9 4.0 4.4 5.0 5.2* 4.6  CL 105 109 105 107 107 106  CO2 25 22 22  20* 21* 22  GLUCOSE 119* 96 110* 147* 126* 108*  BUN 24* 24* 28* 31* 36* 45*  CREATININE 1.64* 1.50* 1.78* 2.02* 2.89* 3.31*  CALCIUM 9.2 7.9* 8.0* 7.7* 7.7* 7.7*  MG 2.1  --  2.0  --   --   --   PHOS 2.6  --  3.8  --   --   --    GFR: Estimated Creatinine Clearance: 30 mL/min (A) (by C-G formula based on SCr of 3.31 mg/dL (H)). Liver Function Tests: Recent Labs  Lab 11/05/23 1015 11/05/23 1840  AST 21 17  ALT 19 17  ALKPHOS 73 57  BILITOT 1.2* 1.2*  PROT 7.9 5.9*  ALBUMIN 4.4 3.4*   Recent Labs  Lab 11/05/23 1840  LIPASE 34   No results for input(s): "AMMONIA" in the last 168 hours. Coagulation Profile: Recent Labs  Lab 11/05/23 1015  INR 1.2   Cardiac Enzymes: No results for input(s): "CKTOTAL", "CKMB", "CKMBINDEX", "TROPONINI" in the last 168 hours. BNP (last 3 results) No results for input(s): "PROBNP" in the last 8760 hours. HbA1C: No results for input(s): "HGBA1C" in the last 72 hours. CBG: Recent Labs  Lab 11/05/23 1256  GLUCAP 93   Lipid Profile: No results for input(s): "CHOL", "HDL", "LDLCALC", "TRIG", "CHOLHDL", "LDLDIRECT" in the last 72 hours.  Thyroid Function Tests: No results for input(s): "TSH", "T4TOTAL", "FREET4", "T3FREE", "THYROIDAB" in the last 72 hours. Anemia Panel: No results for input(s): "VITAMINB12", "FOLATE", "FERRITIN", "TIBC", "IRON", "RETICCTPCT" in the last 72 hours. Most Recent Urinalysis On File:     Component Value Date/Time   COLORURINE YELLOW 09/13/2021 1010   APPEARANCEUR Clear 03/25/2023 0833   LABSPEC 1.025 09/13/2021 1010   LABSPEC 1.013 04/10/2015 1036   PHURINE 5.5 09/13/2021 1010   GLUCOSEU Negative 03/25/2023 0833   GLUCOSEU Negative 04/10/2015 1036   HGBUR SMALL  (A) 09/13/2021 1010   BILIRUBINUR Negative  03/25/2023 0833   BILIRUBINUR Negative 04/10/2015 1036   KETONESUR NEGATIVE 09/13/2021 1010   PROTEINUR 1+ (A) 03/25/2023 0833   PROTEINUR >300 (A) 09/13/2021 1010   UROBILINOGEN 0.2 07/23/2021 1423   UROBILINOGEN 0.2 03/17/2015 0025   NITRITE Negative 03/25/2023 0833   NITRITE NEGATIVE 09/13/2021 1010   LEUKOCYTESUR Negative 03/25/2023 0833   LEUKOCYTESUR NEGATIVE 09/13/2021 1010   LEUKOCYTESUR Negative 04/10/2015 1036   Sepsis Labs: @LABRCNTIP (procalcitonin:4,lacticidven:4) Microbiology: Recent Results (from the past 240 hour(s))  MRSA Next Gen by PCR, Nasal     Status: None   Collection Time: 11/05/23  1:12 PM   Specimen: Nasal Mucosa; Nasal Swab  Result Value Ref Range Status   MRSA by PCR Next Gen NOT DETECTED NOT DETECTED Final    Comment: (NOTE) The GeneXpert MRSA Assay (FDA approved for NASAL specimens only), is one component of a comprehensive MRSA colonization surveillance program. It is not intended to diagnose MRSA infection nor to guide or monitor treatment for MRSA infections. Test performance is not FDA approved in patients less than 73 years old. Performed at [facility], [address] [place], [name] [address]       Radiology Studies last 3 days: ECHOCARDIOGRAM COMPLETE  Result Date: 11/06/2023    ECHOCARDIOGRAM REPORT   Patient Name:   CHANDON DAILY Date of Exam: 11/06/2023 Medical Rec #:  [id number]      Height:       74.0 in Accession #:    [id number]     Weight:       226.6 lb Date of Birth:  1961/07/01     BSA:          2.292 m Patient Age:    61 years       BP:           98/58 mmHg Patient Gender: M              HR:           83 bpm. Exam Location:  [facility] Procedure: 2D Echo, Cardiac Doppler and Color Doppler Indications:     Syncope  History:         Patient has prior history of Echocardiogram examinations, most                  recent 09/10/2021. CAD and Acute MI, COPD,                   Signs/Symptoms:Syncope and Dyspnea; Risk Factors:Hypertension,                  Diabetes and Dyslipidemia. Pulmonary embolus.  Sonographer:     Mikki Harbor Referring Phys:  [phone] CHRISTOPHER END Diagnosing Phys: Debbe Odea MD  Sonographer Comments: Technically difficult study due to poor echo windows, suboptimal subcostal window and suboptimal parasternal window. Image acquisition challenging due to respiratory motion. IMPRESSIONS  1. Left ventricular ejection fraction, by estimation, is 55 to 60%. The left ventricle has normal function. The left ventricle has no regional wall motion abnormalities. There is mild left ventricular hypertrophy. Left ventricular diastolic parameters are consistent with Grade I diastolic dysfunction (impaired relaxation).  2. Right ventricular systolic function is normal. The right ventricular size is mildly enlarged. There is mildly elevated pulmonary artery systolic pressure.  3. The mitral valve is normal in structure. No evidence of mitral valve regurgitation.  4. The aortic valve is grossly normal. Aortic valve regurgitation is not visualized. FINDINGS  Left Ventricle: Left ventricular ejection  fraction, by estimation, is 55 to 60%. The left ventricle has normal function. The left ventricle has no regional wall motion abnormalities. The left ventricular internal cavity size was normal in size. There is  mild left ventricular hypertrophy. Left ventricular diastolic parameters are consistent with Grade I diastolic dysfunction (impaired relaxation). Right Ventricle: The right ventricular size is mildly enlarged. No increase in right ventricular wall thickness. Right ventricular systolic function is normal. There is mildly elevated pulmonary artery systolic pressure. The tricuspid regurgitant velocity is 2.66 m/s, and with an assumed right atrial pressure of 8 mmHg, the estimated right ventricular systolic pressure is 36.3 mmHg. Left Atrium: Left atrial size was normal in  size. Right Atrium: Right atrial size was normal in size. Pericardium: There is no evidence of pericardial effusion. Mitral Valve: The mitral valve is normal in structure. No evidence of mitral valve regurgitation. MV peak gradient, 5.7 mmHg. The mean mitral valve gradient is 2.0 mmHg. Tricuspid Valve: The tricuspid valve is normal in structure. Tricuspid valve regurgitation is trivial. Aortic Valve: The aortic valve is grossly normal. Aortic valve regurgitation is not visualized. Aortic valve mean gradient measures 8.0 mmHg. Aortic valve peak gradient measures 11.0 mmHg. Aortic valve area, by VTI measures 1.97 cm. Pulmonic Valve: The pulmonic valve was not well visualized. Pulmonic valve regurgitation is not visualized. Aorta: The aortic root is normal in size and structure. Venous: The inferior vena cava was not well visualized. IAS/Shunts: No atrial level shunt detected by color flow Doppler.  LEFT VENTRICLE PLAX 2D LVIDd:         4.10 cm   Diastology LVIDs:         3.00 cm   LV e' medial:    9.20 cm/s LV PW:         1.30 cm   LV E/e' medial:  9.8 LV IVS:        1.30 cm   LV e' lateral:   7.94 cm/s LVOT diam:     2.00 cm   LV E/e' lateral: 11.3 LV SV:         69 LV SV Index:   30 LVOT Area:     3.14 cm  RIGHT VENTRICLE RV Basal diam:  3.85 cm RV Mid diam:    3.70 cm RV S prime:     21.80 cm/s TAPSE (M-mode): 2.3 cm LEFT ATRIUM             Index        RIGHT ATRIUM           Index LA diam:        3.30 cm 1.44 cm/m   RA Area:     16.60 cm LA Vol (A2C):   47.8 ml 20.85 ml/m  RA Volume:   49.50 ml  21.60 ml/m LA Vol (A4C):   87.8 ml 38.31 ml/m LA Biplane Vol: 66.6 ml 29.06 ml/m  AORTIC VALVE                     PULMONIC VALVE AV Area (Vmax):    1.99 cm      PV Vmax:       0.96 m/s AV Area (Vmean):   1.64 cm      PV Peak grad:  3.7 mmHg AV Area (VTI):     1.97 cm AV Vmax:           166.00 cm/s AV Vmean:          131.000 cm/s  AV VTI:            0.350 m AV Peak Grad:      11.0 mmHg AV Mean Grad:      8.0 mmHg  LVOT Vmax:         105.00 cm/s LVOT Vmean:        68.500 cm/s LVOT VTI:          0.219 m LVOT/AV VTI ratio: 0.63  AORTA Ao Root diam: 3.40 cm MITRAL VALVE                TRICUSPID VALVE MV Area (PHT): 4.65 cm     TR Peak grad:   28.3 mmHg MV Area VTI:   2.85 cm     TR Vmax:        266.00 cm/s MV Peak grad:  5.7 mmHg MV Mean grad:  2.0 mmHg     SHUNTS MV Vmax:       1.19 m/s     Systemic VTI:  0.22 m MV Vmean:      67.3 cm/s    Systemic Diam: 2.00 cm MV Decel Time: 163 msec MV E velocity: 90.10 cm/s MV A velocity: 130.00 cm/s MV E/A ratio:  0.69 Debbe Odea MD Electronically signed by Debbe Odea MD Signature Date/Time: 11/06/2023/5:40:51 PM    Final    DG Thoracic Spine 2 View  Result Date: 11/06/2023 CLINICAL DATA:  Elective surgery. EXAM: THORACIC SPINE 2 VIEWS COMPARISON:  Thoracic spine MRI yesterday FINDINGS: Two fluoroscopic spot views of the thoracic spine as well as intraoperative CT imaging obtained during multilevel thoracic spine fusion. Fluoroscopy time 1 minute 6 seconds. Dose 85.785 mGy. IMPRESSION: Intraoperative fluoroscopy during thoracic spine fusion. Electronically Signed   By: Narda Rutherford M.D.   On: 11/06/2023 16:30   DG C-Arm 1-60 Min-No Report  Result Date: 11/06/2023 Fluoroscopy was utilized by the requesting physician.  No radiographic interpretation.   MR THORACIC SPINE WO CONTRAST  Result Date: 11/06/2023 CLINICAL DATA:  Initial evaluation for acute mid back pain. EXAM: MRI THORACIC SPINE WITHOUT CONTRAST TECHNIQUE: Multiplanar, multisequence MR imaging of the thoracic spine was performed. No intravenous contrast was administered. COMPARISON:  Prior CT from earlier the same day. FINDINGS: Alignment: Physiologic with preservation of the normal thoracic kyphosis. No listhesis. Vertebrae: Acute transverse fracture extending through the T10 vertebral body again seen. Evidence for extension through the posterior elements on the right (series 19, image 6).  Findings consistent with an acute Chance type fracture, and unstable injury by definition. No significant vertebral body height loss. Probable injury/disruption of the anterior longitudinal ligament at this level. Remainder of the major ligamentous structures appear grossly intact. No other acute or recent fracture within the thoracic spine. Mild chronic compression deformity of L1 noted. Bone marrow signal intensity diffusely heterogeneous but overall within normal limits. Few scattered benign hemangiomata noted. No worrisome osseous lesions. No other abnormal marrow edema. Cord: Normal signal and morphology. No evidence for traumatic cord injury. Paraspinal and other soft tissues: Paraspinous soft tissues demonstrate no acute finding. Small layering bilateral pleural effusions. Complex cystic lesion within the partially visualized right hepatic lobe noted, better seen on prior CT. 1.6 cm simple right renal cyst, benign in appearance, no follow-up imaging recommended. Disc levels: Ordinary for age multilevel disc desiccation seen throughout the thoracic spine. No significant disc bulge or focal disc herniation. Mild multilevel facet degeneration. No spinal stenosis. Neural foramina remain patent. IMPRESSION: 1. Acute transverse fracture extending through the T10 vertebral  body, with extension through the posterior elements on the right. Finding consistent with an acute "Chance" type fracture, an unstable injury by definition. No significant vertebral body height loss. Probable injury/disruption of the anterior longitudinal ligament at this level. 2. No other acute traumatic injury within the thoracic spine or spinal cord. 3. Small layering bilateral pleural effusions. Electronically Signed   By: Rise Mu M.D.   On: 11/06/2023 00:57   CT ANGIO CHEST AORTA W/CM &/OR WO/CM  Result Date: 11/06/2023 CLINICAL DATA:  Chest trauma, posterior mediastinal stranding on CT abdomen/pelvis EXAM: CT ANGIOGRAPHY  CHEST WITH CONTRAST TECHNIQUE: Multidetector CT imaging of the chest was performed using the standard protocol during bolus administration of intravenous contrast. Multiplanar CT image reconstructions and MIPs were obtained to evaluate the vascular anatomy. RADIATION DOSE REDUCTION: This exam was performed according to the departmental dose-optimization program which includes automated exposure control, adjustment of the mA and/or kV according to patient size and/or use of iterative reconstruction technique. CONTRAST:  OMNIPAQUE IOHEXOL 350 MG/ML SOLN COMPARISON:  MRI thoracic spine dated 11/05/2023. CT abdomen/pelvis dated 11142 than 24. FINDINGS: Cardiovascular: On unenhanced CT, there is no evidence of intramural hematoma. Following contrast administration, there is no evidence of traumatic aortic injury. Mild atherosclerotic calcifications of the aortic arch. Although not tailored for evaluation of the pulmonary arteries, there is no evidence of central pulmonary embolism to the lobar level. The heart is normal in size.  No pericardial effusion. Moderate three-vessel coronary atherosclerosis. Mediastinum/Nodes: No evidence of anterior mediastinal hematoma. No suspicious mediastinal lymphadenopathy. Mild prevertebral stranding/hemorrhage at T10 (series 4/image 46), related to the vertebral body fracture at this level, better evaluated on MR. Visualized thyroid is unremarkable. Lungs/Pleura: Mild dependent atelectasis in the bilateral lower lobes. No focal consolidation or aspiration. No suspicious pulmonary nodules. No pleural effusion or pneumothorax. Upper Abdomen: Evaluated on recent CT abdomen/pelvis. Musculoskeletal: Dedicated thoracic spine evaluation (with T10 fracture) is better evaluated on recent MR thoracic spine. Otherwise, no fracture is seen. Review of the MIP images confirms the above findings. IMPRESSION: T10 vertebral body fracture, better evaluated on recent MRI thoracic spine. Associated  mild prevertebral stranding/hemorrhage at this level, corresponding to the finding on recent CT abdomen/pelvis. No evidence of traumatic aortic injury. Aortic Atherosclerosis (ICD10-I70.0). Electronically Signed   By: Charline Bills M.D.   On: 11/06/2023 00:55   CT ABDOMEN PELVIS WO CONTRAST  Result Date: 11/05/2023 CLINICAL DATA:  Abdominal trauma, blunt r/o hematoma EXAM: CT ABDOMEN AND PELVIS WITHOUT CONTRAST TECHNIQUE: Multidetector CT imaging of the abdomen and pelvis was performed following the standard protocol without IV contrast. RADIATION DOSE REDUCTION: This exam was performed according to the departmental dose-optimization program which includes automated exposure control, adjustment of the mA and/or kV according to patient size and/or use of iterative reconstruction technique. COMPARISON:  CT abdomen pelvis 03/17/2018, MRI 09/30/2023 FINDINGS: Lower chest: Stranding of the visualized posterior distal mediastinum. Coronary calcification. Mitral annular calcification. Hepatobiliary: Chronic 9.4 x 8.2 cm right posterior hepatic lobe fluid dense lesion with thin septations and associated calcification. Possible vicarious excretion of contrast via the gallbladder. No gallstones, gallbladder wall thickening, or pericholecystic fluid. No biliary dilatation. Pancreas: No focal lesion. Normal pancreatic contour. No surrounding inflammatory changes. No main pancreatic ductal dilatation. Spleen: Normal in size without focal abnormality. Adrenals/Urinary Tract: No adrenal nodule bilaterally. No nephrolithiasis and no hydronephrosis. No definite contour-deforming renal mass. No ureterolithiasis or hydroureter. Renal excretion of previously administered intravenous contrast. The urinary bladder is unremarkable. Stomach/Bowel: Stomach is within  normal limits. No evidence of bowel wall thickening or dilatation. Colonic diverticulosis. Appendix appears normal. Vascular/Lymphatic: No abdominal aorta or iliac  aneurysm. Mild atherosclerotic plaque of the aorta and its branches. No abdominal, pelvic, or inguinal lymphadenopathy. Reproductive: The prostate is enlarged measuring up to 4.9 cm. Other: No intraperitoneal free fluid. No intraperitoneal free gas. No organized fluid collection. Musculoskeletal: Left lower anterior abdominal wall subcutaneus soft tissue edema and hematoma formation. Associated subcutaneus soft tissue emphysema. No suspicious lytic or blastic osseous lesions. Acute transverse fracture through the T10 anterior wall and contiguous osteophytes (5:69, 6:68). Query similar finding along the T11 inferior endplate (6:68). Surgical hardware fixation of the left proximal femur. Left L5-S1 posterolateral and anterior surgical hardware. Chronic L4 superior endplate compression fracture status post kyphoplasty. IMPRESSION: 1. Fat stranding of the visualized posterior distal mediastinum. Finding likely related to thoracic spine fracture below. Recommend CT chest with intravenous contrast for further evaluation. Differential diagnosis includes inflammation, aortic injury, esophageal injury. 2. Distraction injury of the thoracic spine. Recommend thoracolumbar MRI for further evaluation of possible underlying ligamentous injury. Acute transverse fracture through the T10 vertebral body anterior wall and contiguous osteophytes. Query similar finding along the T11 inferior endplate. Colonic diverticulosis with no acute diverticulitis. 3. Left lower anterior abdominal wall subcutaneus soft tissue edema and hematoma formation. Associated subcutaneus soft tissue emphysema. No retained radiopaque foreign body. 4. Prostatomegaly. 5.  Aortic Atherosclerosis (ICD10-I70.0). These results will be called to the ordering clinician or representative by the Radiologist Assistant, and communication documented in the PACS or [employer]. Electronically Signed   By: Tish Frederickson M.D.   On: 11/05/2023 22:20   CARDIAC  CATHETERIZATION  Result Date: 11/05/2023 Conclusions: Severe multivessel coronary artery disease, as detailed above.  Culprit lesion for inferior STEMI appears to be hazy 80-90% distal LCx stenosis.  There is also occlusion of the proximal RCA with bridging and left-to-right collaterals that could not be crossed with a wire suggesting chronic nature.  Moderate to severe but noncritical proximal/mid LAD and proximal ramus disease also present. Normal left ventricular filling pressure (LVEDP 14 mmHg). Successful IVUS-guided PCI to hazy 80-90% distal LCx stenosis using Onyx Frontier 3.0 x 30 mm drug-eluting stent with 0% residual stenosis and TIMI-3 flow.  First medical contact to balloon time was delayed because of the patient's syncope and head trauma necessitating CT head and cervical spine in the ER prior to transport to the cath lab as well as multiple potential culprit lesions. Recommendations: Admit to ICU for post STEMI monitoring. Begin heparin infusion when next dose of apixaban would be due (approximately 8 PM).  Transition to apixaban as soon as tomorrow if there is no evidence of bleeding or vascular complication. Anticipate triple therapy with aspirin, clopidogrel, and apixaban for 1 month after which time aspirin can be discontinued and clopidogrel and apixaban continued for 12 months. Aggressive secondary prevention of coronary disease, including escalation of rosuvastatin. Consider functional study when patient has recovered from this hospitalization to assess hemodynamic significance of LAD and ramus intermedius disease. Gentle postcatheterization hydration in the setting of chronic kidney disease. Yvonne Kendall, MD [facility]  CT Head Wo Contrast  Result Date: 11/05/2023 CLINICAL DATA:  Head trauma, coagulopathy (Age 58-64y); Neck trauma, midline tenderness (Age 69-64y) EXAM: CT HEAD WITHOUT CONTRAST CT CERVICAL SPINE WITHOUT CONTRAST TECHNIQUE: Multidetector CT imaging of the head and  cervical spine was performed following the standard protocol without intravenous contrast. Multiplanar CT image reconstructions of the cervical spine were also generated. RADIATION  DOSE REDUCTION: This exam was performed according to the departmental dose-optimization program which includes automated exposure control, adjustment of the mA and/or kV according to patient size and/or use of iterative reconstruction technique. COMPARISON:  None Available. FINDINGS: CT HEAD FINDINGS Brain: No evidence of acute infarction, hemorrhage, hydrocephalus, extra-axial collection or mass lesion/mass effect. Vascular: No hyperdense vessel. Skull: No acute fracture. Sinuses/Orbits: Clear sinuses.  No acute orbital findings. Other: No mastoid effusions. Heterogeneous appearance of the parotid glands with many small calcifications. CT CERVICAL SPINE FINDINGS Alignment: No substantial sagittal subluxation. Skull base and vertebrae: Mild height loss of upper thoracic vertebral bodies appears similar to CT of the chest from Apr 27, 2017. No evidence of acute fracture. Soft tissues and spinal canal: No prevertebral fluid or swelling. No visible canal hematoma. Disc levels:  Moderate multilevel degenerative change. Upper chest: Visualized lung apices are clear. IMPRESSION: 1. No evidence of acute abnormality intracranially or in the cervical spine. 2. Heterogeneous appearance of the parotid glands with many small calcifications, which suggests chronic inflammation (such as with Sjogren's). Electronically Signed   By: Feliberto Harts M.D.   On: 11/05/2023 10:43   CT Cervical Spine Wo Contrast  Result Date: 11/05/2023 CLINICAL DATA:  Head trauma, coagulopathy (Age 18-64y); Neck trauma, midline tenderness (Age 89-64y) EXAM: CT HEAD WITHOUT CONTRAST CT CERVICAL SPINE WITHOUT CONTRAST TECHNIQUE: Multidetector CT imaging of the head and cervical spine was performed following the standard protocol without intravenous contrast. Multiplanar  CT image reconstructions of the cervical spine were also generated. RADIATION DOSE REDUCTION: This exam was performed according to the departmental dose-optimization program which includes automated exposure control, adjustment of the mA and/or kV according to patient size and/or use of iterative reconstruction technique. COMPARISON:  None Available. FINDINGS: CT HEAD FINDINGS Brain: No evidence of acute infarction, hemorrhage, hydrocephalus, extra-axial collection or mass lesion/mass effect. Vascular: No hyperdense vessel. Skull: No acute fracture. Sinuses/Orbits: Clear sinuses.  No acute orbital findings. Other: No mastoid effusions. Heterogeneous appearance of the parotid glands with many small calcifications. CT CERVICAL SPINE FINDINGS Alignment: No substantial sagittal subluxation. Skull base and vertebrae: Mild height loss of upper thoracic vertebral bodies appears similar to CT of the chest from Apr 27, 2017. No evidence of acute fracture. Soft tissues and spinal canal: No prevertebral fluid or swelling. No visible canal hematoma. Disc levels:  Moderate multilevel degenerative change. Upper chest: Visualized lung apices are clear. IMPRESSION: 1. No evidence of acute abnormality intracranially or in the cervical spine. 2. Heterogeneous appearance of the parotid glands with many small calcifications, which suggests chronic inflammation (such as with Sjogren's). Electronically Signed   By: Feliberto Harts M.D.   On: 11/05/2023 10:43       Time spent: 50 min     Sunnie Nielsen, DO [facility] 11/08/2023, 12:42 PM    Dictation software may have been used to generate the above note. Typos may occur and escape review in typed/dictated notes. Please contact Dr Lyn Hollingshead directly for clarity if needed.  Staff may message me via secure chat in Epic  but this may not receive an immediate response,  please page me for urgent matters!  If 7PM-7AM, please contact night  coverage [email or web address]

## 2023-11-08 NOTE — Evaluation (Signed)
Occupational Therapy Evaluation Patient Details Name: Jonathon Snow MRN: [id number] DOB: Jun 01, 1961 Today's Date: 11/08/2023   History of Present Illness Pt is a 62 yo male that presented with sudden lightheadness and syncope, admitted with new inferior ST segment elevation, underwent emergent cardiac cath and PCI, also s/p  open reduction and internal fixation of T10 fracture, posterior lateral arthrodesis T7-T12.   Clinical Impression   Pt was seen for OT evaluation this date and co-tx with PT to optimize safety with ADL mobility. Prior to hospital admission, pt was independent with ADL and mobility, driving, and endorses 3 falls in past 39mo 2/2 passing out. Pt lives alone and not currently working. Pt presents to acute OT demonstrating impaired ADL performance and functional mobility 2/2 significant back pain, need for acute O2, impaired balance, and activity tolerance (See OT problem list for additional functional deficits). Pt currently requires MAX A +2 for all aspects of bed mobility, MAX A for donning TLSO. Pt ultimately too pain limited to attempt STS transfers. Pt required MAX A for bed level LB dressing. Pt would benefit from skilled OT services to address noted impairments and functional limitations (see below for any additional details) in order to maximize safety and independence while minimizing falls risk and caregiver burden.     If plan is discharge home, recommend the following: Two people to help with walking and/or transfers;Two people to help with bathing/dressing/bathroom;Assistance with cooking/housework;Help with stairs or ramp for entrance;Assist for transportation    Functional Status Assessment  Patient has had a recent decline in their functional status and demonstrates the ability to make significant improvements in function in a reasonable and predictable amount of time.  Equipment Recommendations  Other (comment) (defer to next venue)    Recommendations for  Other Services       Precautions / Restrictions Precautions Precautions: Back;Fall;Other (comment) Precaution Comments: LUE A line, s/p R radial heart cath Restrictions Weight Bearing Restrictions: Yes RUE Weight Bearing: Non weight bearing LUE Weight Bearing: Non weight bearing Other Position/Activity Restrictions: LUE A line NWBing through wrist while A line is in - clarify with RN restrictions once removed; s/p R radial - both essentially NWBing through wrists ~1 wk      Mobility Bed Mobility Overal bed mobility: Needs Assistance Bed Mobility: Rolling, Supine to Sit, Sit to Supine Rolling: Max assist   Supine to sit: Max assist, +2 for physical assistance Sit to supine: +2 for physical assistance, Max assist        Transfers                   General transfer comment: ultimately too pain limited to attempt      Balance Overall balance assessment: Needs assistance Sitting-balance support: Feet supported, No upper extremity supported Sitting balance-Leahy Scale: Fair Sitting balance - Comments: initial assist required 2/2 dizziness and pain, improving to SBA-CGA for static sitting balance. VC to uncross his ankles for improved balance                                   ADL either performed or assessed with clinical judgement   ADL Overall ADL's : Needs assistance/impaired                                       General ADL Comments: Pt currently  requiring MAX A for LB ADL tasks, MAX A for brace mgt, and anticipate MIN-MOD A for seated UB ADL tasks. Very pain limited.     Vision         Perception         Praxis         Pertinent Vitals/Pain Pain Assessment Pain Assessment: 0-10 Pain Score: 8  Pain Location: middle back, worse in sitting Pain Descriptors / Indicators: Aching, Moaning, Sharp, Guarding, Grimacing Pain Intervention(s): Limited activity within patient's tolerance, Monitored during session, Repositioned,  Premedicated before session     Extremity/Trunk Assessment Upper Extremity Assessment Upper Extremity Assessment: Right hand dominant;RUE deficits/detail;LUE deficits/detail RUE Deficits / Details: s/p R radial heart cath, NWBing through wrist x1wk, formal MMT limited LUE Deficits / Details: A line present, formal MMT limited   Lower Extremity Assessment Lower Extremity Assessment: Defer to PT evaluation (limited by pain)   Cervical / Trunk Assessment Cervical / Trunk Assessment: Back Surgery   Communication     Cognition Arousal: Lethargic Behavior During Therapy: WFL for tasks assessed/performed Overall Cognitive Status: Within Functional Limits for tasks assessed                                 General Comments: Oriented x4, had tendency to close his eyes at times but easily alerted with VC, able to follow commands well     General Comments  SpO2 ~ 85% on 3L EOB, increased to 4L and improved to low 90's. Back down to 3L at end of session and maintaining in low 90's. BP monitored via A line throughout. BP and HR WFL for tasks assessed.    Exercises Other Exercises Other Exercises: Pt educated in back precautions, log roll cues during bed mobility   Shoulder Instructions      Home Living Family/patient expects to be discharged to:: Private residence Living Arrangements: Alone   Type of Home: House Home Access: Level entry     Home Layout: One level     Bathroom Shower/Tub: Chief Strategy Officer: Standard     Home Equipment: Agricultural consultant (2 wheels)          Prior Functioning/Environment Prior Level of Function : Independent/Modified Independent;Driving;History of Falls (last six months)             Mobility Comments: had been using 2WW but had been able to transition off of it. Endorses 3 falls in past 32mo 2/2 passing out. ADLs Comments: Indep with ADL and IADL including driving. 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)        OT Problem List: Pain;Cardiopulmonary status limiting activity;Decreased activity tolerance;Decreased knowledge of use of DME or AE;Impaired balance (sitting and/or standing);Impaired UE functional use;Decreased knowledge of precautions      OT Treatment/Interventions: Self-care/ADL training;Therapeutic exercise;Therapeutic activities;DME and/or AE instruction;Patient/family education;Balance training    OT Goals(Current goals can be found in the care plan section) Acute Rehab OT Goals Patient Stated Goal: get better, have less pain OT Goal Formulation: With patient Time For Goal Achievement: 11/22/23 Potential to Achieve Goals: Good ADL Goals Pt Will Perform Lower Body Dressing: sit to/from stand;with adaptive equipment;with min assist Pt Will Transfer to Toilet: bedside commode;ambulating;with min assist (LRAD) Pt Will Perform Toileting - Clothing Manipulation and hygiene: with modified independence Additional ADL Goal #1: Pt will don TLSO requiring set up and supv while seated, 2/2  opportunities. Additional ADL Goal #2: Pt will maintain back precautions during bed mobility requiring MIN A in preparation for EOB ADL, 3/3 opportunities.  OT Frequency: Min 1X/week    Co-evaluation PT/OT/SLP Co-Evaluation/Treatment: Yes Reason for Co-Treatment: For patient/therapist safety;To address functional/ADL transfers PT goals addressed during session: Mobility/safety with mobility;Balance OT goals addressed during session: ADL's and self-care      AM-PAC OT "6 Clicks" Daily Activity     Outcome Measure Help from another person eating meals?: None Help from another person taking care of personal grooming?: A Little Help from another person toileting, which includes using toliet, bedpan, or urinal?: Total Help from another person bathing (including washing, rinsing, drying)?: A Lot Help from another person to put on and taking off regular upper body clothing?: A  Lot Help from another person to put on and taking off regular lower body clothing?: A Lot 6 Click Score: 14   End of Session Equipment Utilized During Treatment: Oxygen;Back brace Nurse Communication: Mobility status;Patient requests pain meds  Activity Tolerance: Patient limited by pain Patient left: in bed;with call bell/phone within reach;with bed alarm set  OT Visit Diagnosis: Other abnormalities of gait and mobility (R26.89);Pain;History of falling (Z91.81) Pain - part of body:  (middle back)                Time: 0829-0900 OT Time Calculation (min): 31 min Charges:  OT General Charges $OT Visit: 1 Visit OT Evaluation $OT Eval High Complexity: 1 High OT Treatments $Self Care/Home Management : 8-22 mins  Arman Filter., MPH, MS, OTR/L ascom [phone] 11/08/23, 11:08 AM

## 2023-11-08 NOTE — Progress Notes (Signed)
[facility] [place], [name] 11/08/23  Subjective:   Hospital day # 3  Patient known to our practice from office follow-up for lupus nephritis.  Last seen in October 2024.  He presented via EMS from home with code STEMI.  Per notes, he was cooking outside and felt dizzy and had a syncopal episode.  He sustained a laceration to the posterior part of his head. He underwent emergent coronary angiography and was found to have multivessel coronary artery disease.  He underwent PCI to left circumflex.  CT scan of the abdomen and pelvis showed thoracic spine fracture, left lower anterior abdominal wall subcutaneous hematoma.  He then underwent CT angiography of the chest which confirmed T10 vertebral body fracture with associated mild prevertebral stranding and hemorrhage.    He was evaluated by neurosurgery..  Because of various thoracic spine injuries, he is deemed to be extremely high risk for spinal cord injury.  T7-T12 fixation of the fracture and fusion of the thoracic spine was recommended.Anticoagulation with apixaban and aspirin and clopidogrel is on hold.  Patient underwent successful open reduction internal fixation of T10 fracture, posterior lateral arthrodesis T7-T12 on 11/06/2023.  Unfortunately patient has developed acute kidney injury.  His creatinine has increased fromBaseline 1.35/GFR 60 on 09/29/2019 24-3.31/GFR 20 this morning.  Nephrology consult has not been requested to evaluate. This morning he feels well.  Sitting up in bed, eating breakfast when seen.   Objective:  Vital signs in last 24 hours:  Temp:  [97.4 F (36.3 C)-98.6 F (37 C)] 97.7 F (36.5 C) (11/17 0800) Pulse Rate:  [76-106] 87 (11/17 0821) Resp:  [4-24] 17 (11/17 0821) BP: (88-139)/(60-93) 106/61 (11/17 0821) SpO2:  [86 %-95 %] 90 % (11/17 0821) Arterial Line BP: (76-180)/(41-81) 130/61 (11/17 0821)  Weight change:  Filed Weights   11/05/23 1016 11/05/23 1301  Weight: 107.9  kg 102.8 kg    Intake/Output:    Intake/Output Summary (Last 24 hours) at 11/08/2023 0923 Last data filed at 11/08/2023 0506 Gross per 24 hour  Intake 157.89 ml  Output 1055 ml  Net -897.11 ml     Physical Exam: General: No acute distress, laying in the bed  HEENT Moist oral mucous membranes  Pulm/lungs Normal breathing effort, [place] O2 3 to 4 L/min  CVS/Heart Tachycardic, regular  Abdomen:  Soft, nontender  Extremities: No edema  Neurologic: Alert, oriented  Skin: Left groin hematoma, right forearm hematoma  Foley In place       Basic Metabolic Panel:  Recent Labs  Lab 11/05/23 1015 11/05/23 1840 11/06/23 0425 11/07/23 0352 11/07/23 1637 11/08/23 0403  NA 140 137 136 136 136 135  K 3.9 4.0 4.4 5.0 5.2* 4.6  CL 105 109 105 107 107 106  CO2 25 22 22  20* 21* 22  GLUCOSE 119* 96 110* 147* 126* 108*  BUN 24* 24* 28* 31* 36* 45*  CREATININE 1.64* 1.50* 1.78* 2.02* 2.89* 3.31*  CALCIUM 9.2 7.9* 8.0* 7.7* 7.7* 7.7*  MG 2.1  --  2.0  --   --   --   PHOS 2.6  --  3.8  --   --   --      CBC: Recent Labs  Lab 11/05/23 1015 11/05/23 1757 11/06/23 0053 11/06/23 0425 11/07/23 0352 11/07/23 1637 11/08/23 0403  WBC 8.0 8.8  --  7.6 8.6 8.7 7.6  NEUTROABS 6.8 7.2  --   --   --   --   --   HGB 16.8 14.0 13.0  13.0 11.2* 11.4* 10.5*  HCT 52.9* 42.9 40.8 40.3 34.6* 35.8* 33.8*  MCV 93.1 90.5  --  90.6 90.8 94.7 94.9  PLT 149* 111*  --  110* 118* 112* 102*     No results found for: "HEPBSAG", "HEPBSAB", "HEPBIGM"    Microbiology:  Recent Results (from the past 240 hour(s))  MRSA Next Gen by PCR, Nasal     Status: None   Collection Time: 11/05/23  1:12 PM   Specimen: Nasal Mucosa; Nasal Swab  Result Value Ref Range Status   MRSA by PCR Next Gen NOT DETECTED NOT DETECTED Final    Comment: (NOTE) The GeneXpert MRSA Assay (FDA approved for NASAL specimens only), is one component of a comprehensive MRSA colonization surveillance program. It is not intended to  diagnose MRSA infection nor to guide or monitor treatment for MRSA infections. Test performance is not FDA approved in patients less than 45 years old. Performed at [facility], [address] [place], [name] [address]     Coagulation Studies: Recent Labs    11/05/23 1015  LABPROT 15.1  INR 1.2    Urinalysis: No results for input(s): "COLORURINE", "LABSPEC", "PHURINE", "GLUCOSEU", "HGBUR", "BILIRUBINUR", "KETONESUR", "PROTEINUR", "UROBILINOGEN", "NITRITE", "LEUKOCYTESUR" in the last 72 hours.  Invalid input(s): "APPERANCEUR"    Imaging: ECHOCARDIOGRAM COMPLETE  Result Date: 11/06/2023    ECHOCARDIOGRAM REPORT   Patient Name:   ADEKUNLE MEECE Date of Exam: 11/06/2023 Medical Rec #:  [id number]      Height:       74.0 in Accession #:    [id number]     Weight:       226.6 lb Date of Birth:  02-25-1961     BSA:          2.292 m Patient Age:    61 years       BP:           98/58 mmHg Patient Gender: M              HR:           83 bpm. Exam Location:  [facility] Procedure: 2D Echo, Cardiac Doppler and Color Doppler Indications:     Syncope  History:         Patient has prior history of Echocardiogram examinations, most                  recent 09/10/2021. CAD and Acute MI, COPD,                  Signs/Symptoms:Syncope and Dyspnea; Risk Factors:Hypertension,                  Diabetes and Dyslipidemia. Pulmonary embolus.  Sonographer:     Mikki Harbor Referring Phys:  [id number] CHRISTOPHER END Diagnosing Phys: Debbe Odea MD  Sonographer Comments: Technically difficult study due to poor echo windows, suboptimal subcostal window and suboptimal parasternal window. Image acquisition challenging due to respiratory motion. IMPRESSIONS  1. Left ventricular ejection fraction, by estimation, is 55 to 60%. The left ventricle has normal function. The left ventricle has no regional wall motion abnormalities. There is mild left ventricular hypertrophy. Left ventricular diastolic parameters are  consistent with Grade I diastolic dysfunction (impaired relaxation).  2. Right ventricular systolic function is normal. The right ventricular size is mildly enlarged. There is mildly elevated pulmonary artery systolic pressure.  3. The mitral valve is normal in structure. No evidence of mitral valve regurgitation.  4. The aortic valve  is grossly normal. Aortic valve regurgitation is not visualized. FINDINGS  Left Ventricle: Left ventricular ejection fraction, by estimation, is 55 to 60%. The left ventricle has normal function. The left ventricle has no regional wall motion abnormalities. The left ventricular internal cavity size was normal in size. There is  mild left ventricular hypertrophy. Left ventricular diastolic parameters are consistent with Grade I diastolic dysfunction (impaired relaxation). Right Ventricle: The right ventricular size is mildly enlarged. No increase in right ventricular wall thickness. Right ventricular systolic function is normal. There is mildly elevated pulmonary artery systolic pressure. The tricuspid regurgitant velocity is 2.66 m/s, and with an assumed right atrial pressure of 8 mmHg, the estimated right ventricular systolic pressure is 36.3 mmHg. Left Atrium: Left atrial size was normal in size. Right Atrium: Right atrial size was normal in size. Pericardium: There is no evidence of pericardial effusion. Mitral Valve: The mitral valve is normal in structure. No evidence of mitral valve regurgitation. MV peak gradient, 5.7 mmHg. The mean mitral valve gradient is 2.0 mmHg. Tricuspid Valve: The tricuspid valve is normal in structure. Tricuspid valve regurgitation is trivial. Aortic Valve: The aortic valve is grossly normal. Aortic valve regurgitation is not visualized. Aortic valve mean gradient measures 8.0 mmHg. Aortic valve peak gradient measures 11.0 mmHg. Aortic valve area, by VTI measures 1.97 cm. Pulmonic Valve: The pulmonic valve was not well visualized. Pulmonic valve  regurgitation is not visualized. Aorta: The aortic root is normal in size and structure. Venous: The inferior vena cava was not well visualized. IAS/Shunts: No atrial level shunt detected by color flow Doppler.  LEFT VENTRICLE PLAX 2D LVIDd:         4.10 cm   Diastology LVIDs:         3.00 cm   LV e' medial:    9.20 cm/s LV PW:         1.30 cm   LV E/e' medial:  9.8 LV IVS:        1.30 cm   LV e' lateral:   7.94 cm/s LVOT diam:     2.00 cm   LV E/e' lateral: 11.3 LV SV:         69 LV SV Index:   30 LVOT Area:     3.14 cm  RIGHT VENTRICLE RV Basal diam:  3.85 cm RV Mid diam:    3.70 cm RV S prime:     21.80 cm/s TAPSE (M-mode): 2.3 cm LEFT ATRIUM             Index        RIGHT ATRIUM           Index LA diam:        3.30 cm 1.44 cm/m   RA Area:     16.60 cm LA Vol (A2C):   47.8 ml 20.85 ml/m  RA Volume:   49.50 ml  21.60 ml/m LA Vol (A4C):   87.8 ml 38.31 ml/m LA Biplane Vol: 66.6 ml 29.06 ml/m  AORTIC VALVE                     PULMONIC VALVE AV Area (Vmax):    1.99 cm      PV Vmax:       0.96 m/s AV Area (Vmean):   1.64 cm      PV Peak grad:  3.7 mmHg AV Area (VTI):     1.97 cm AV Vmax:  166.00 cm/s AV Vmean:          131.000 cm/s AV VTI:            0.350 m AV Peak Grad:      11.0 mmHg AV Mean Grad:      8.0 mmHg LVOT Vmax:         105.00 cm/s LVOT Vmean:        68.500 cm/s LVOT VTI:          0.219 m LVOT/AV VTI ratio: 0.63  AORTA Ao Root diam: 3.40 cm MITRAL VALVE                TRICUSPID VALVE MV Area (PHT): 4.65 cm     TR Peak grad:   28.3 mmHg MV Area VTI:   2.85 cm     TR Vmax:        266.00 cm/s MV Peak grad:  5.7 mmHg MV Mean grad:  2.0 mmHg     SHUNTS MV Vmax:       1.19 m/s     Systemic VTI:  0.22 m MV Vmean:      67.3 cm/s    Systemic Diam: 2.00 cm MV Decel Time: 163 msec MV E velocity: 90.10 cm/s MV A velocity: 130.00 cm/s MV E/A ratio:  0.69 Debbe Odea MD Electronically signed by Debbe Odea MD Signature Date/Time: 11/06/2023/5:40:51 PM    Final    DG Thoracic Spine 2  View  Result Date: 11/06/2023 CLINICAL DATA:  Elective surgery. EXAM: THORACIC SPINE 2 VIEWS COMPARISON:  Thoracic spine MRI yesterday FINDINGS: Two fluoroscopic spot views of the thoracic spine as well as intraoperative CT imaging obtained during multilevel thoracic spine fusion. Fluoroscopy time 1 minute 6 seconds. Dose 85.785 mGy. IMPRESSION: Intraoperative fluoroscopy during thoracic spine fusion. Electronically Signed   By: Narda Rutherford M.D.   On: 11/06/2023 16:30   DG C-Arm 1-60 Min-No Report  Result Date: 11/06/2023 Fluoroscopy was utilized by the requesting physician.  No radiographic interpretation.     Medications:    sodium chloride     norepinephrine (LEVOPHED) Adult infusion Stopped (11/07/23 1613)   promethazine (PHENERGAN) injection (IM or IVPB) Stopped (11/05/23 2050)    acetaminophen  1,000 mg Oral Q6H   ARIPiprazole  2 mg Oral Daily   aspirin  81 mg Oral Daily   Chlorhexidine Gluconate Cloth  6 each Topical Daily   docusate sodium  100 mg Oral BID   gabapentin  300 mg Oral TID   hydrOXYzine  25 mg Oral BID   loratadine  5 mg Oral QPM   montelukast  10 mg Oral QHS   rosuvastatin  20 mg Oral Daily   senna  1 tablet Oral BID   sertraline  100 mg Oral Daily   sodium chloride flush  3 mL Intravenous Q12H   acetaminophen, albuterol, LORazepam, magnesium citrate, methocarbamol **OR** methocarbamol (ROBAXIN) injection, ondansetron (ZOFRAN) IV, mouth rinse, oxyCODONE, oxyCODONE, polyethylene glycol, sodium chloride flush, sorbitol  Assessment/ Plan:  62 y.o. male with lupus nephritis, hypertension, mild CKD, lymphedema, secondary hyperparathyroidism, history of lumbar herpes zoster infection, history of pulmonary embolus with IVC filter placement, right ankle fracture status post ORIF, herniated disc status post L5-S1 fusion history of right axillary and left groin abscess in May 2018    admitted on 11/05/2023 for STEMI (ST elevation myocardial infarction) [facility])  [I21.3]  Hospital course is complicated by Scalp laceration, left circumflex PCI with stent placement, thoracic spine fracture status post T7-T12  fixation and thoracic spine fusion by neurosurgery this admission, acute kidney injury  1.  Acute kidney injury on chronic kidney disease stage 3a with underlying lupus nephritis, hypotension.  Lupus nephritis has been managed with CellCept (1000 mg twice a day), hydroxychloroquine 200 mg p.o. daily, losartan 100 mg p.o. daily, prednisone 10 mg daily as outpatient. Most recent outpatient labs-09/29/2023-creatinine 1.36, GFR 59 AKI likely secondary to hypotension and multiple IV contrast exposures. Serum creatinine has increased to 3.31 today.  Urine output of 855 cc. CellCept is currently on hold to allow for recent neurosurgical site healing. We will continue to monitor renal parameters during hospitalization and obtain urinalysis and complement studies. No acute indication for dialysis at present. We will follow along.    LOS: 3 Goldie Dimmer 11/17/20249:23 AM  [facility] [place], [name] [phone]  Note: This note was prepared with Dragon dictation. Any transcription errors are unintentional

## 2023-11-08 NOTE — Progress Notes (Signed)
Rounding Note    Patient Name: Jonathon Snow Date of Encounter: 11/08/2023  [facility] Cardiologist: Yvonne Kendall, MD   Subjective   Alert today, communicative Starting to work with PT On aspirin, no Plavix Nor epi held, blood pressure low but stable   large ecchymotic bruising left groin  Neurosurgery yesterday open reduction and internal fixation of T10 fracture, posterior lateral arthrodesis T7-T12  Inpatient Medications    Scheduled Meds:  acetaminophen  1,000 mg Oral Q6H   ARIPiprazole  2 mg Oral Daily   aspirin  81 mg Oral Daily   Chlorhexidine Gluconate Cloth  6 each Topical Daily   docusate sodium  100 mg Oral BID   gabapentin  300 mg Oral TID   hydrOXYzine  25 mg Oral BID   loratadine  5 mg Oral QPM   montelukast  10 mg Oral QHS   rosuvastatin  20 mg Oral Daily   senna  1 tablet Oral BID   sertraline  100 mg Oral Daily   sodium chloride flush  3 mL Intravenous Q12H   Continuous Infusions:  sodium chloride     norepinephrine (LEVOPHED) Adult infusion Stopped (11/07/23 1613)   promethazine (PHENERGAN) injection (IM or IVPB) Stopped (11/05/23 2050)   PRN Meds: acetaminophen, albuterol, LORazepam, magnesium citrate, methocarbamol **OR** methocarbamol (ROBAXIN) injection, ondansetron (ZOFRAN) IV, mouth rinse, oxyCODONE, oxyCODONE, polyethylene glycol, sodium chloride flush, sorbitol   Vital Signs    Vitals:   11/08/23 0503 11/08/23 0554 11/08/23 0800 11/08/23 0821  BP:    106/61  Pulse:  89  87  Resp:  12  17  Temp: 97.9 F (36.6 C)  97.7 F (36.5 C)   TempSrc: Axillary  Oral   SpO2:  94%  90%  Weight:      Height:        Intake/Output Summary (Last 24 hours) at 11/08/2023 0906 Last data filed at 11/08/2023 0506 Gross per 24 hour  Intake 157.89 ml  Output 1055 ml  Net -897.11 ml      11/05/2023    1:01 PM 11/05/2023   10:16 AM 10/28/2023    1:32 PM  Last 3 Weights  Weight (lbs) 226 lb 10.1 oz 237 lb 14 oz 230 lb 8 oz   Weight (kg) 102.8 kg 107.9 kg 104.554 kg      Telemetry    Normal sinus rhythm- Personally Reviewed  ECG     - Personally Reviewed  Physical Exam   Constitutional:  oriented to person, place, and time. No distress.  HENT:  Head: Grossly normal Eyes:  no discharge. No scleral icterus.  Neck: No JVD, no carotid bruits  Cardiovascular: Regular rate and rhythm, no murmurs appreciated Pulmonary/Chest: Clear to auscultation bilaterally, no wheezes or rails Abdominal: Soft.  no distension.  no tenderness.  Musculoskeletal: Normal range of motion Neurological:  normal muscle tone. Coordination normal. No atrophy Skin: Skin warm and dry large region ecchymotic bruising left groin Psychiatric: normal affect, pleasant   Labs    High Sensitivity Troponin:   Recent Labs  Lab 11/05/23 1015 11/05/23 1346 11/05/23 1757 11/05/23 1840  TROPONINIHS 8 6 10 10      Chemistry Recent Labs  Lab 11/05/23 1015 11/05/23 1840 11/06/23 0425 11/07/23 0352 11/07/23 1637 11/08/23 0403  NA 140 137 136 136 136 135  K 3.9 4.0 4.4 5.0 5.2* 4.6  CL 105 109 105 107 107 106  CO2 25 22 22  20* 21* 22  GLUCOSE 119* 96 110* 147*  126* 108*  BUN 24* 24* 28* 31* 36* 45*  CREATININE 1.64* 1.50* 1.78* 2.02* 2.89* 3.31*  CALCIUM 9.2 7.9* 8.0* 7.7* 7.7* 7.7*  MG 2.1  --  2.0  --   --   --   PROT 7.9 5.9*  --   --   --   --   ALBUMIN 4.4 3.4*  --   --   --   --   AST 21 17  --   --   --   --   ALT 19 17  --   --   --   --   ALKPHOS 73 57  --   --   --   --   BILITOT 1.2* 1.2*  --   --   --   --   GFRNONAA 47* 53* 43* 37* 24* 20*  ANIONGAP 10 6 9 9 8 7     Lipids  Recent Labs  Lab 11/05/23 1012  CHOL 199  TRIG 105  HDL 59  LDLCALC 119*  CHOLHDL 3.4    Hematology Recent Labs  Lab 11/07/23 0352 11/07/23 1637 11/08/23 0403  WBC 8.6 8.7 7.6  RBC 3.81* 3.78* 3.56*  HGB 11.2* 11.4* 10.5*  HCT 34.6* 35.8* 33.8*  MCV 90.8 94.7 94.9  MCH 29.4 30.2 29.5  MCHC 32.4 31.8 31.1  RDW 15.8* 15.9*  15.9*  PLT 118* 112* 102*   Thyroid No results for input(s): "TSH", "FREET4" in the last 168 hours.  BNPNo results for input(s): "BNP", "PROBNP" in the last 168 hours.  DDimer No results for input(s): "DDIMER" in the last 168 hours.   Radiology    ECHOCARDIOGRAM COMPLETE  Result Date: 11/06/2023    ECHOCARDIOGRAM REPORT   Patient Name:   Jonathon Snow Date of Exam: 11/06/2023 Medical Rec #:  [id number]      Height:       74.0 in Accession #:    [id number]     Weight:       226.6 lb Date of Birth:  [birth year]     BSA:          2.292 m Patient Age:    61 years       BP:           98/58 mmHg Patient Gender: M              HR:           83 bpm. Exam Location:  [facility] Procedure: 2D Echo, Cardiac Doppler and Color Doppler Indications:     Syncope  History:         Patient has prior history of Echocardiogram examinations, most                  recent 09/10/2021. CAD and Acute MI, COPD,                  Signs/Symptoms:Syncope and Dyspnea; Risk Factors:Hypertension,                  Diabetes and Dyslipidemia. Pulmonary embolus.  Sonographer:     Mikki Harbor Referring Phys:  [phone] CHRISTOPHER END Diagnosing Phys: Debbe Odea MD  Sonographer Comments: Technically difficult study due to poor echo windows, suboptimal subcostal window and suboptimal parasternal window. Image acquisition challenging due to respiratory motion. IMPRESSIONS  1. Left ventricular ejection fraction, by estimation, is 55 to 60%. The left ventricle has normal function. The left ventricle has no regional wall motion abnormalities. There is  mild left ventricular hypertrophy. Left ventricular diastolic parameters are consistent with Grade I diastolic dysfunction (impaired relaxation).  2. Right ventricular systolic function is normal. The right ventricular size is mildly enlarged. There is mildly elevated pulmonary artery systolic pressure.  3. The mitral valve is normal in structure. No evidence of mitral valve regurgitation.  4. The  aortic valve is grossly normal. Aortic valve regurgitation is not visualized. FINDINGS  Left Ventricle: Left ventricular ejection fraction, by estimation, is 55 to 60%. The left ventricle has normal function. The left ventricle has no regional wall motion abnormalities. The left ventricular internal cavity size was normal in size. There is  mild left ventricular hypertrophy. Left ventricular diastolic parameters are consistent with Grade I diastolic dysfunction (impaired relaxation). Right Ventricle: The right ventricular size is mildly enlarged. No increase in right ventricular wall thickness. Right ventricular systolic function is normal. There is mildly elevated pulmonary artery systolic pressure. The tricuspid regurgitant velocity is 2.66 m/s, and with an assumed right atrial pressure of 8 mmHg, the estimated right ventricular systolic pressure is 36.3 mmHg. Left Atrium: Left atrial size was normal in size. Right Atrium: Right atrial size was normal in size. Pericardium: There is no evidence of pericardial effusion. Mitral Valve: The mitral valve is normal in structure. No evidence of mitral valve regurgitation. MV peak gradient, 5.7 mmHg. The mean mitral valve gradient is 2.0 mmHg. Tricuspid Valve: The tricuspid valve is normal in structure. Tricuspid valve regurgitation is trivial. Aortic Valve: The aortic valve is grossly normal. Aortic valve regurgitation is not visualized. Aortic valve mean gradient measures 8.0 mmHg. Aortic valve peak gradient measures 11.0 mmHg. Aortic valve area, by VTI measures 1.97 cm. Pulmonic Valve: The pulmonic valve was not well visualized. Pulmonic valve regurgitation is not visualized. Aorta: The aortic root is normal in size and structure. Venous: The inferior vena cava was not well visualized. IAS/Shunts: No atrial level shunt detected by color flow Doppler.  LEFT VENTRICLE PLAX 2D LVIDd:         4.10 cm   Diastology LVIDs:         3.00 cm   LV e' medial:    9.20 cm/s LV PW:          1.30 cm   LV E/e' medial:  9.8 LV IVS:        1.30 cm   LV e' lateral:   7.94 cm/s LVOT diam:     2.00 cm   LV E/e' lateral: 11.3 LV SV:         69 LV SV Index:   30 LVOT Area:     3.14 cm  RIGHT VENTRICLE RV Basal diam:  3.85 cm RV Mid diam:    3.70 cm RV S prime:     21.80 cm/s TAPSE (M-mode): 2.3 cm LEFT ATRIUM             Index        RIGHT ATRIUM           Index LA diam:        3.30 cm 1.44 cm/m   RA Area:     16.60 cm LA Vol (A2C):   47.8 ml 20.85 ml/m  RA Volume:   49.50 ml  21.60 ml/m LA Vol (A4C):   87.8 ml 38.31 ml/m LA Biplane Vol: 66.6 ml 29.06 ml/m  AORTIC VALVE                     PULMONIC VALVE  AV Area (Vmax):    1.99 cm      PV Vmax:       0.96 m/s AV Area (Vmean):   1.64 cm      PV Peak grad:  3.7 mmHg AV Area (VTI):     1.97 cm AV Vmax:           166.00 cm/s AV Vmean:          131.000 cm/s AV VTI:            0.350 m AV Peak Grad:      11.0 mmHg AV Mean Grad:      8.0 mmHg LVOT Vmax:         105.00 cm/s LVOT Vmean:        68.500 cm/s LVOT VTI:          0.219 m LVOT/AV VTI ratio: 0.63  AORTA Ao Root diam: 3.40 cm MITRAL VALVE                TRICUSPID VALVE MV Area (PHT): 4.65 cm     TR Peak grad:   28.3 mmHg MV Area VTI:   2.85 cm     TR Vmax:        266.00 cm/s MV Peak grad:  5.7 mmHg MV Mean grad:  2.0 mmHg     SHUNTS MV Vmax:       1.19 m/s     Systemic VTI:  0.22 m MV Vmean:      67.3 cm/s    Systemic Diam: 2.00 cm MV Decel Time: 163 msec MV E velocity: 90.10 cm/s MV A velocity: 130.00 cm/s MV E/A ratio:  0.69 Debbe Odea MD Electronically signed by Debbe Odea MD Signature Date/Time: 11/06/2023/5:40:51 PM    Final    DG Thoracic Spine 2 View  Result Date: 11/06/2023 CLINICAL DATA:  Elective surgery. EXAM: THORACIC SPINE 2 VIEWS COMPARISON:  Thoracic spine MRI yesterday FINDINGS: Two fluoroscopic spot views of the thoracic spine as well as intraoperative CT imaging obtained during multilevel thoracic spine fusion. Fluoroscopy time 1 minute 6 seconds. Dose 85.785  mGy. IMPRESSION: Intraoperative fluoroscopy during thoracic spine fusion. Electronically Signed   By: Narda Rutherford M.D.   On: 11/06/2023 16:30   DG C-Arm 1-60 Min-No Report  Result Date: 11/06/2023 Fluoroscopy was utilized by the requesting physician.  No radiographic interpretation.    Cardiac Studies     Patient Profile      Mr. Novey is a 62 year old man with sudden lightheadedness and syncope earlier today, admitted with new inferior ST segment elevation prompting emergent cardiac catheterization and PCI to distal LCx.  Yesterday with open reduction and internal fixation of T10 fracture, posterior lateral arthrodesis T7-T12   Assessment & Plan    Inferior STEMI and syncope catheterization lab November 05, 2023, stent to distal left circumflex Plavix held catheterization for back surgery, open reduction and internal fixation T10 fracture On aspirin, plan to start Plavix November 18 per medicine service who has discussed with neurosurgery -High risk window for stent thrombosis off Plavix, difficult situation So far stable, denies angina  Syncope Mechanism unclear, described as frank syncope Underwent ischemic workup November 14, stent placement distal left circumflex Trauma from fall requiring open reduction and internal fixation T10 fracture yesterday Starting to work with PT  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 Will need DVT prophylaxis, SCIDS As outpatient was taking Eliquis, this is on hold in the setting of recent back surgery  Will discuss with interventional cardiology whether he should be on Eliquis Plavix at discharge  Hypotension Exacerbated by pain medication, anesthesia, back surgery yesterday Echocardiogram with normal ejection fraction Off pressors, blood pressure stable Starting IV fluids  Acute on chronic renal failure Worsening creatinine now greater than 3 Agree with IV fluids If no improvement in renal function,  unable to exclude contrast nephropathy in the setting of chronic renal failure     For questions or updates, please contact [facility] Please consult [email or web address] for contact info under        Signed, Julien Nordmann, MD  11/08/2023, 9:06 AM

## 2023-11-08 NOTE — Plan of Care (Signed)
  Problem: Education: Goal: Knowledge of General Education information will improve Description: Including pain rating scale, medication(s)/side effects and non-pharmacologic comfort measures Outcome: Progressing   Problem: Clinical Measurements: Goal: Ability to maintain clinical measurements within normal limits will improve Outcome: Progressing Goal: Will remain free from infection Outcome: Progressing Goal: Respiratory complications will improve Outcome: Progressing Goal: Cardiovascular complication will be avoided Outcome: Progressing   Problem: Activity: Goal: Risk for activity intolerance will decrease Outcome: Not Progressing   Problem: Pain Management: Goal: General experience of comfort will improve Outcome: Not Progressing   Problem: Skin Integrity: Goal: Risk for impaired skin integrity will decrease Outcome: Not Progressing

## 2023-11-08 NOTE — Evaluation (Signed)
Physical Therapy Evaluation Patient Details Name: Jonathon Snow MRN: [id number] DOB: 1961-11-20 Today's Date: 11/08/2023  History of Present Illness  Pt is a 62 yo male that presented with sudden lightheadness and syncope, admitted with new inferior ST segment elevation, underwent emergent cardiac cath and PCI, also s/p  open reduction and internal fixation of T10 fracture, posterior lateral arthrodesis T7-T12. PMH of COPD, HTN, CAD, past MI, PVD, DVT, cerbral venous sinus thrombosis,  lupus, lymphedema, PE, renal disorder stage III, back surgery .   Clinical Impression  The patient was lethargic throughout evaluation but able to follow commands and open eyes but did return to resting when not directly spoken too. Pt stated at baseline he is modI/I, recently stopped using a RW, lives alone.  The patient required maxA1-3 assist for all mobility. Supine to sit with maxAX2, very challenged by essentially NWB of BUE (R heart cath with hematoma, A line in L wrist). Able to sit EOB for several minutes and progressed to sitting with CGA-supervision. Pt agreeable to attempt standing with bilateral handheld assist (prepped for weight bearing bialteral elbows) but when he initiated the movement he was unable to do so due to elevated pain. Returned to supine maxAx3 for safety.  Overall the patient demonstrated deficits (see "PT Problem List") that impede the patient's functional abilities, safety, and mobility and would benefit from skilled PT intervention.          If plan is discharge home, recommend the following: Two people to help with walking and/or transfers;Two people to help with bathing/dressing/bathroom;Direct supervision/assist for medications management;Help with stairs or ramp for entrance;Assist for transportation;Assistance with feeding;Assistance with cooking/housework   Can travel by private vehicle   No    Equipment Recommendations Other (comment) (TBD)  Recommendations for Other  Services       Functional Status Assessment Patient has had a recent decline in their functional status and demonstrates the ability to make significant improvements in function in a reasonable and predictable amount of time.     Precautions / Restrictions Precautions Precautions: Back;Fall;Other (comment) Precaution Comments: LUE A line, s/p R radial heart cath Restrictions Weight Bearing Restrictions: Yes RUE Weight Bearing: Non weight bearing LUE Weight Bearing: Non weight bearing Other Position/Activity Restrictions: LUE A line NWBing through wrist while A line is in - clarify with RN restrictions once removed; s/p R radial - both essentially NWBing through wrists ~1 wk      Mobility  Bed Mobility Overal bed mobility: Needs Assistance Bed Mobility: Rolling, Supine to Sit, Sit to Supine Rolling: Max assist, +2 for safety/equipment   Supine to sit: Max assist, +2 for physical assistance Sit to supine: +2 for physical assistance, Max assist        Transfers                   General transfer comment: ultimately too pain limited to attempt    Ambulation/Gait                  Stairs            Wheelchair Mobility     Tilt Bed    Modified Rankin (Stroke Patients Only)       Balance Overall balance assessment: Needs assistance Sitting-balance support: Feet supported, No upper extremity supported Sitting balance-Leahy Scale: Fair Sitting balance - Comments: initial assist required 2/2 dizziness and pain, improving to SBA-CGA for static sitting balance. VC to uncross his ankles for improved balance  Pertinent Vitals/Pain Pain Assessment Pain Assessment: 0-10 Pain Score: 8  Pain Location: middle back, worse in sitting Pain Descriptors / Indicators: Aching, Moaning, Sharp, Guarding, Grimacing Pain Intervention(s): Limited activity within patient's tolerance, Monitored during session,  Repositioned, Premedicated before session    Home Living Family/patient expects to be discharged to:: Private residence Living Arrangements: Alone   Type of Home: House Home Access: Level entry       Home Layout: One level Home Equipment: Agricultural consultant (2 wheels)      Prior Function Prior Level of Function : Independent/Modified Independent;Driving;History of Falls (last six months)             Mobility Comments: had been using 2WW but had been able to transition off of it. Endorses 3 falls in past 59mo 2/2 passing out. ADLs Comments: Indep with ADL and IADL including driving. 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)     Extremity/Trunk Assessment   Upper Extremity Assessment Upper Extremity Assessment: Defer to OT evaluation RUE Deficits / Details: s/p R radial heart cath, NWBing through wrist x1wk, formal MMT limited LUE Deficits / Details: A line present, formal MMT limited    Lower Extremity Assessment Lower Extremity Assessment: Generalized weakness (limited by pain more than strength, but able to heel slide and lift BLE from bed minimally)    Cervical / Trunk Assessment Cervical / Trunk Assessment: Back Surgery  Communication      Cognition Arousal: Lethargic Behavior During Therapy: [facility] for tasks assessed/performed                                   General Comments: Oriented x4, had tendency to close his eyes at times but easily alerted with VC, able to follow commands well        General Comments General comments (skin integrity, edema, etc.): SpO2 ~ 85% on 3L EOB, increased to 4L and improved to low 90's. Back down to 3L at end of session and maintaining in low 90's. BP monitored via A line throughout. BP and HR WFL for tasks assessed.    Exercises     Assessment/Plan    PT Assessment Patient needs continued PT services  PT Problem List Decreased strength;Pain;Decreased range of motion;Decreased activity  tolerance;Decreased knowledge of use of DME;Decreased balance;Decreased mobility;Decreased knowledge of precautions       PT Treatment Interventions DME instruction;Neuromuscular re-education;Gait training;Stair training;Patient/family education;Functional mobility training;Therapeutic activities;Therapeutic exercise;Balance training    PT Goals (Current goals can be found in the Care Plan section)  Acute Rehab PT Goals Patient Stated Goal: to have less pain PT Goal Formulation: With patient Time For Goal Achievement: 11/22/23 Potential to Achieve Goals: Good    Frequency 7X/week     Co-evaluation PT/OT/SLP Co-Evaluation/Treatment: Yes Reason for Co-Treatment: For patient/therapist safety;To address functional/ADL transfers PT goals addressed during session: Mobility/safety with mobility;Balance OT goals addressed during session: ADL's and self-care       AM-PAC PT "6 Clicks" Mobility  Outcome Measure Help needed turning from your back to your side while in a flat bed without using bedrails?: A Lot Help needed moving from lying on your back to sitting on the side of a flat bed without using bedrails?: Total Help needed moving to and from a bed to a chair (including a wheelchair)?: Total Help needed standing up from a chair using your arms (e.g., wheelchair or bedside chair)?: Total Help  needed to walk in hospital room?: Total Help needed climbing 3-5 steps with a railing? : Total 6 Click Score: 7    End of Session Equipment Utilized During Treatment: Back brace Activity Tolerance: Patient limited by pain Patient left: in bed;with call bell/phone within reach;with bed alarm set Nurse Communication: Mobility status PT Visit Diagnosis: Difficulty in walking, not elsewhere classified (R26.2);Other abnormalities of gait and mobility (R26.89);Muscle weakness (generalized) (M62.81);Pain Pain - Right/Left:  (midline) Pain - part of body:  (back)    Time: 0829-0900 PT Time  Calculation (min) (ACUTE ONLY): 31 min   Charges:   PT Evaluation $PT Eval Moderate Complexity: 1 Mod PT Treatments $Therapeutic Activity: 8-22 mins PT General Charges $$ ACUTE PT VISIT: 1 Visit        Olga Coaster PT, DPT 11:59 AM,11/08/23

## 2023-11-08 NOTE — TOC Initial Note (Signed)
Transition of Care [facility]) - Initial/Assessment Note    Patient Details  Name: Jonathon Snow MRN: [id number] Date of Birth: [birth year]  Transition of Care [facility]) CM/SW Contact:    Colette Ribas, LCSWA Phone Number: 11/08/2023, 12:07 PM  Clinical Narrative:                 CSW spoke with patient explained role. Patiet has intermittent transportation but expects to have transportation at DC. His PCP's on file are up to date. He uses [facility] in [place]. He has a HH history unsure of HH agency used in the past. He does not not have SNF. Asked patient if he has any addtl needs at this time. He declined.        Patient Goals and CMS Choice            Expected Discharge Plan and Services                                              Prior Living Arrangements/Services                       Activities of Daily Living      Permission Sought/Granted                  Emotional Assessment              Admission diagnosis:  STEMI (ST elevation myocardial infarction) (HCC) [I21.3] Patient Active Problem List   Diagnosis Date Noted   Hematoma of groin 11/08/2023   Closed T10 spinal fracture (HCC) 11/06/2023   Closed tricolumnar fracture of thoracic vertebra (HCC) 11/06/2023   Thoracic spine instability 11/06/2023   Shock circulatory (HCC) 11/06/2023   Heart attack (HCC) 11/05/2023   STEMI (ST elevation myocardial infarction) (HCC) 11/05/2023   Head injury 11/05/2023   Chronic bilateral low back pain with left-sided sciatica 07/07/2023   Severe episode of recurrent major depressive disorder, 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   Neuropathy 07/07/2023   Hx of colonic polyps 07/01/2023   Adenomatous polyp of colon 07/01/2023   SLE (systemic lupus erythematosus related syndrome) (HCC) 02/19/2023   Closed left hip fracture, initial encounter (HCC) 02/18/2023   Pain and swelling of right lower extremity 02/03/2023   Erectile disorder  01/22/2023   Coronary artery disease of native artery of native heart with stable angina pectoris (HCC) 09/09/2022   Current moderate episode of major depressive disorder (HCC) 09/09/2022   Varicose 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 01/03/2022   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) 09/18/2021   Prediabetes 11/08/2020   Stage 3a chronic kidney disease (HCC) 11/08/2020   Seasonal allergies 11/08/2020   Rhinosinusitis 11/08/2020   Long term current use of systemic steroids 11/08/2020   Anticoagulant disorder (HCC) 11/07/2020   Senile purpura (HCC) 11/07/2020   History of DVT (deep vein thrombosis) 03/22/2020   MDD (major depressive disorder), recurrent episode, mild (HCC) 09/12/2019   Other spondylosis with radiculopathy, lumbar region 02/16/2019   Osteoporosis 10/12/2018   Chronic venous insufficiency 10/07/2018   Lymphedema 10/07/2018   SLE glomerulonephritis syndrome, 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HCC) 03/03/2018   Syncope and collapse 12/29/2017   Acute renal failure with acute tubular necrosis superimposed on stage 3b chronic kidney disease (HCC) 04/27/2017   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) 10/29/2016   Mixed hyperlipidemia 01/15/2016   Primary hypertension 01/15/2016   Obesity (BMI 30.0-34.9) 01/15/2016   Long term current use of anticoagulant 10/04/2015   Systemic lupus erythematosus (HCC) 05/30/2015   COPD, moderate (HCC) 06/27/2014   History of pulmonary embolism 12/11/2013   Nodule of right lung 12/11/2013   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   Cystic disease of liver 08/21/2013   PCP:  Danelle Berry, PA-C Pharmacy:   [facility] [id number] [place], [place] - [address] AT [facility] OF [address] [name] [address] Phone: [phone] Fax: [phone]     Social Determinants of Health (SDOH) Social History: SDOH Screenings   Food Insecurity: No Food  Insecurity (02/19/2023)  Housing: Low Risk  (02/19/2023)  Transportation Needs: No Transportation Needs (02/19/2023)  Utilities: Not At Risk (02/19/2023)  Alcohol Screen: Low Risk  (10/28/2023)  Depression (PHQ2-9): Low Risk  (10/28/2023)  Social Connections: Unknown (04/25/2022)   Received from [facility], [facility]  Tobacco Use: Low Risk  (10/28/2023)   SDOH Interventions:     Readmission Risk Interventions     No data to display

## 2023-11-08 NOTE — Progress Notes (Signed)
   Progress Note   Date: 11/08/2023 T7-12 PSF  HPI: Jonathon. Jonathon Snow is here today with a chief complaint of fall of unclear etiology yesterday.  He presented to the hospital where he had severe chest pain and was a STEMI activation.  He went to the cardiac lab where he had a stent placed in his left circumflex artery.  He was loaded with Plavix and was on Eliquis until yesterday.  He is currently not on Eliquis and has not started aspirin and Plavix yet orally.  He is also on CellCept for his lupus.   He has severe chest pain.  He denies any neurologic symptoms otherwise.  He has not mobilized since his procedure.  24 Hour note: POD#1 Patient in ICU, complaining of expected back pain. He does not report any new lower extremity symptoms    Problem List Patient Active Problem List   Diagnosis Date Noted   Closed T10 spinal fracture (HCC) 11/06/2023   Closed tricolumnar fracture of thoracic vertebra (HCC) 11/06/2023   Thoracic spine instability 11/06/2023   Shock circulatory (HCC) 11/06/2023   Heart attack (HCC) 11/05/2023   STEMI (ST elevation myocardial infarction) (HCC) 11/05/2023   Head injury 11/05/2023   Chronic bilateral low back pain with left-sided sciatica 07/07/2023   Severe episode of recurrent major depressive disorder, 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   Neuropathy 07/07/2023   Hx of colonic polyps 07/01/2023   Adenomatous polyp of colon 07/01/2023   SLE (systemic lupus erythematosus related syndrome) (HCC) 02/19/2023   Closed left hip fracture, initial encounter (HCC) 02/18/2023   Pain and swelling of right lower extremity 02/03/2023   Erectile disorder 01/22/2023   Coronary artery disease of native artery of native heart with stable angina pectoris (HCC) 09/09/2022   Current moderate episode of major depressive disorder (HCC) 09/09/2022   Varicose 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 01/03/2022   Immunocompromised state due to drug therapy  (HCC) 09/18/2021   Prediabetes 11/08/2020   Stage 3a chronic kidney disease (HCC) 11/08/2020   Seasonal allergies 11/08/2020   Rhinosinusitis 11/08/2020   Long term current use of systemic steroids 11/08/2020   Anticoagulant disorder (HCC) 11/07/2020   Senile purpura (HCC) 11/07/2020   History of DVT (deep vein thrombosis) 03/22/2020   MDD (major depressive disorder), recurrent episode, mild (HCC) 09/12/2019   Other spondylosis with radiculopathy, lumbar region 02/16/2019   Osteoporosis 10/12/2018   Chronic venous insufficiency 10/07/2018   Lymphedema 10/07/2018   SLE glomerulonephritis syndrome, 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HCC) 03/03/2018   Syncope and collapse 12/29/2017   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   - Can start working with PT/OT with brace, will need TLSO brace - Pain control as needed - Advance diet as tolerated - Clear for DVT prophylaxis and ASA, Plavix Monday unless needed sooner    Lucy Chris, MD

## 2023-11-09 LAB — CBC
HCT: 31.5 % — ABNORMAL LOW (ref 39.0–52.0)
Hemoglobin: 10 g/dL — ABNORMAL LOW (ref 13.0–17.0)
MCH: 29.7 pg (ref 26.0–34.0)
MCHC: 31.7 g/dL (ref 30.0–36.0)
MCV: 93.5 fL (ref 80.0–100.0)
Platelets: 101 10*3/uL — ABNORMAL LOW (ref 150–400)
RBC: 3.37 MIL/uL — ABNORMAL LOW (ref 4.22–5.81)
RDW: 15.7 % — ABNORMAL HIGH (ref 11.5–15.5)
WBC: 5.6 10*3/uL (ref 4.0–10.5)
nRBC: 0 % (ref 0.0–0.2)

## 2023-11-09 LAB — BASIC METABOLIC PANEL
Anion gap: 5 (ref 5–15)
BUN: 47 mg/dL — ABNORMAL HIGH (ref 8–23)
CO2: 23 mmol/L (ref 22–32)
Calcium: 7.8 mg/dL — ABNORMAL LOW (ref 8.9–10.3)
Chloride: 107 mmol/L (ref 98–111)
Creatinine, Ser: 2.25 mg/dL — ABNORMAL HIGH (ref 0.61–1.24)
GFR, Estimated: 32 mL/min — ABNORMAL LOW (ref >60)
Glucose, Bld: 110 mg/dL — ABNORMAL HIGH (ref 70–99)
Potassium: 4 mmol/L (ref 3.5–5.1)
Sodium: 135 mmol/L (ref 135–145)

## 2023-11-09 MED ORDER — DEXTROSE-SODIUM CHLORIDE 5-0.45 % IV SOLN: INTRAVENOUS | Status: DC

## 2023-11-09 MED ORDER — POLYETHYLENE GLYCOL 3350 17 G PO PACK
34.0000 g | PACK | Freq: Every day | ORAL | Status: DC
  Administered 2023-11-09 – 2023-11-11 (×3): 34 g via ORAL
  Filled 2023-11-09 (×3): qty 2

## 2023-11-09 MED ORDER — TAMSULOSIN HCL 0.4 MG PO CAPS
0.4000 mg | ORAL_CAPSULE | Freq: Every day | ORAL | Status: DC
  Administered 2023-11-09 – 2023-11-12 (×4): 0.4 mg via ORAL
  Filled 2023-11-09 (×4): qty 1

## 2023-11-09 MED ORDER — ENOXAPARIN SODIUM 40 MG/0.4ML IJ SOSY: 40.0000 mg | PREFILLED_SYRINGE | INTRAMUSCULAR | Status: DC

## 2023-11-09 MED ORDER — CLOPIDOGREL BISULFATE 75 MG PO TABS
75.0000 mg | ORAL_TABLET | Freq: Every day | ORAL | Status: DC
  Administered 2023-11-09 – 2023-11-13 (×5): 75 mg via ORAL
  Filled 2023-11-09 (×5): qty 1

## 2023-11-09 NOTE — NC FL2 (Signed)
[place]  MEDICAID FL2 LEVEL OF CARE FORM     IDENTIFICATION  Patient Name: Jonathon Snow Birthdate: 10-22-1961 Sex: male Admission Date (Current Location): 11/05/2023  [facility] and [place] Number:  Chiropodist and Address:  [facility], [address], [place], [name] [address]      Provider Number: [id number]  Attending Physician Name and Address:  Kathrynn Running, MD  Relative Name and Phone Number:  [name] 9daughter)  [phone]    Current Level of Care: Hospital Recommended Level of Care: Skilled Nursing Facility Prior Approval Number:    Date Approved/Denied:   PASRR Number: [id number]  Discharge Plan: SNF    Current Diagnoses: Patient Active Problem List   Diagnosis Date Noted   Hematoma of groin 11/08/2023   Closed T10 spinal fracture (HCC) 11/06/2023   Closed tricolumnar fracture of thoracic vertebra (HCC) 11/06/2023   Thoracic spine instability 11/06/2023   Shock circulatory (HCC) 11/06/2023   Heart attack (HCC) 11/05/2023   STEMI (ST elevation myocardial infarction) (HCC) 11/05/2023   Head injury 11/05/2023   Chronic bilateral low back pain with left-sided sciatica 07/07/2023   Severe episode of recurrent major depressive disorder, 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   Neuropathy 07/07/2023   Hx of colonic polyps 07/01/2023   Adenomatous polyp of colon 07/01/2023   SLE (systemic lupus erythematosus related syndrome) (HCC) 02/19/2023   Closed left hip fracture, initial encounter (HCC) 02/18/2023   Pain and swelling of right lower extremity 02/03/2023   Erectile disorder 01/22/2023   Coronary artery disease of native artery of native heart with stable angina pectoris (HCC) 09/09/2022   Current moderate episode of major depressive disorder (HCC) 09/09/2022   Varicose 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 01/03/2022   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) 09/18/2021   Prediabetes  11/08/2020   Stage 3a chronic kidney disease (HCC) 11/08/2020   Seasonal allergies 11/08/2020   Rhinosinusitis 11/08/2020   Long term current use of systemic steroids 11/08/2020   Anticoagulant disorder (HCC) 11/07/2020   Senile purpura (HCC) 11/07/2020   History of DVT (deep vein thrombosis) 03/22/2020   MDD (major depressive disorder), recurrent episode, mild (HCC) 09/12/2019   Other spondylosis with radiculopathy, lumbar region 02/16/2019   Osteoporosis 10/12/2018   Chronic venous insufficiency 10/07/2018   Lymphedema 10/07/2018   SLE glomerulonephritis syndrome, 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HCC) 03/03/2018   Syncope and collapse 12/29/2017   Acute renal failure with acute tubular necrosis superimposed on stage 3b chronic kidney disease (HCC) 04/27/2017   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) 10/29/2016   Mixed hyperlipidemia 01/15/2016   Primary hypertension 01/15/2016   Obesity (BMI 30.0-34.9) 01/15/2016   Long term current use of anticoagulant 10/04/2015   Systemic lupus erythematosus (HCC) 05/30/2015   COPD, moderate (HCC) 06/27/2014   History of pulmonary embolism 12/11/2013   Nodule of right lung 12/11/2013   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   Cystic disease of liver 08/21/2013    Orientation RESPIRATION BLADDER Height & Weight     Time, Situation, Place, Self  O2 (1L nasal cannula) Continent Weight: 226 lb 10.1 oz (102.8 kg) Height:  6\' 2"  (188 cm)  BEHAVIORAL SYMPTOMS/MOOD NEUROLOGICAL BOWEL NUTRITION STATUS      Continent Diet (see discharge summary)  AMBULATORY STATUS COMMUNICATION OF NEEDS Skin   Extensive Assist Verbally Other (Comment) (incisions backs)  Personal Care Assistance Level of Assistance  Bathing, Feeding, Dressing, Total care Bathing Assistance: Maximum assistance Feeding assistance: Independent Dressing Assistance: Maximum assistance Total Care Assistance: Maximum  assistance   Functional Limitations Info  Sight, Hearing, Speech Sight Info: Adequate Hearing Info: Adequate Speech Info: Adequate    SPECIAL CARE FACTORS FREQUENCY  PT (By licensed PT), OT (By licensed OT)     PT Frequency: min 4x weekly OT Frequency: min 4x weekly            Contractures Contractures Info: Not present    Additional Factors Info  Code Status, Allergies Code Status Info: full Allergies Info: enalapril, vicodin (hydrocodone-acetaminophen           Current Medications (11/09/2023):  This is the current hospital active medication list Current Facility-Administered Medications  Medication Dose Route Frequency Provider Last Rate Last Admin   acetaminophen (TYLENOL) tablet 1,000 mg  1,000 mg Oral Q6H Susanne Borders, PA   1,000 mg at 11/09/23 0500   albuterol (PROVENTIL) (2.5 MG/3ML) 0.083% nebulizer solution 3 mL  3 mL Inhalation Q6H PRN Susanne Borders, PA       ARIPiprazole (ABILIFY) tablet 2 mg  2 mg Oral Daily Susanne Borders, PA   2 mg at 11/08/23 1000   aspirin chewable tablet 81 mg  81 mg Oral Daily Susanne Borders, [place]   81 mg at 11/08/23 1000   Chlorhexidine Gluconate Cloth 2 % PADS 6 each  6 each Topical Daily Susanne Borders, PA   6 each at 11/08/23 0859   docusate sodium (COLACE) capsule 100 mg  100 mg Oral BID Susanne Borders, PA   100 mg at 11/09/23 0830   gabapentin (NEURONTIN) capsule 200 mg  200 mg Oral TID Sunnie Nielsen, DO   200 mg at 11/09/23 0830   lidocaine (LIDODERM) 5 % 2 patch  2 patch Transdermal Q24H Sunnie Nielsen, DO   2 patch at 11/08/23 1159   loratadine (CLARITIN) tablet 5 mg  5 mg Oral QPM Susanne Borders, PA   5 mg at 11/08/23 1738   LORazepam (ATIVAN) tablet 0.5 mg  0.5 mg Oral BID PRN Susanne Borders, PA       magnesium citrate solution 1 Bottle  1 Bottle Oral Once PRN Susanne Borders, PA       methocarbamol (ROBAXIN) tablet 500 mg  500 mg Oral Q6H PRN Susanne Borders, PA       Or    methocarbamol (ROBAXIN) injection 500 mg  500 mg Intravenous Q6H PRN Susanne Borders, PA       montelukast (SINGULAIR) tablet 10 mg  10 mg Oral QHS Susanne Borders, PA   10 mg at 11/08/23 2114   ondansetron (ZOFRAN) injection 4 mg  4 mg Intravenous Q6H PRN Susanne Borders, PA   4 mg at 11/07/23 2003   Oral care mouth rinse  15 mL Mouth Rinse PRN Susanne Borders, PA       oxyCODONE (Oxy IR/ROXICODONE) immediate release tablet 5 mg  5 mg Oral Q4H PRN Sunnie Nielsen, DO   5 mg at 11/09/23 0830   oxyCODONE (Oxy IR/ROXICODONE) immediate release tablet 7.5 mg  7.5 mg Oral Q4H PRN Sunnie Nielsen, DO       polyethylene glycol (MIRALAX / GLYCOLAX) packet 17 g  17 g Oral Daily PRN Susanne Borders, PA   17 g at 11/09/23 0830   rosuvastatin (CRESTOR) tablet 20 mg  20 mg  Oral Daily Susanne Borders, [place]   20 mg at 11/08/23 1610   senna (SENOKOT) tablet 8.6 mg  1 tablet Oral BID Susanne Borders, PA   8.6 mg at 11/09/23 0830   sertraline (ZOLOFT) tablet 100 mg  100 mg Oral Daily Susanne Borders, [place]   100 mg at 11/08/23 9604   sodium chloride flush (NS) 0.9 % injection 3 mL  3 mL Intravenous Q12H Susanne Borders, [place]   3 mL at 11/08/23 2117   sodium chloride flush (NS) 0.9 % injection 3 mL  3 mL Intravenous PRN Susanne Borders, PA       sorbitol 70 % solution 30 mL  30 mL Oral Daily PRN Susanne Borders, PA         Discharge Medications: Please see discharge summary for a list of discharge medications.  Relevant Imaging Results:  Relevant Lab Results:   Additional Information SSN: [id number]  Darolyn Rua, LCSW

## 2023-11-09 NOTE — Progress Notes (Signed)
PROGRESS NOTE    [name]   OZD:[id number] DOB: 1960-12-27  DOA: 11/05/2023 Date of Service: 11/09/23 which is hospital day 4  PCP: Danelle Berry, PA-C    HPI: Jonathon Snow is a 62 y.o. 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 Patient suddenly felt lightheadedness while cooking breakfast alone at home and then passed out and collapsed on the floor and hit the backside of his head. Recovered consciousness by his own and seeing breakfast still cooking on the stove top. Unsure about duration of unconsciousness. Able to call EMS himself.   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  Noncritical stenosis of LAD and ramus, not stented.  CT head and neck negative for fracture or dislocation. Hypotensive requiring pressors, concern w/ hematoma forearm at cath site as well as abdomen, CT showed distraction injury T10, abdominal wall hematoma no concerns for internal hemorrhage. Neurosurgery consult and additional imaging.  11/15: T7-12 posterior fusion, Plt transfusion. Echo - EF 55-60%, no RWMA, G1 diast df ,mild elevated pulm artery pressure, no significant valvular disease.  Stable following surgery  11/16: hypotensive, back on pressors  11/17: off pressors. AKI worsening, nephrology consulted.   Consultants:  Cardiology  Critical Care General Surgery  Neurosurgery  Nephrology   Procedures/Surgeries: 11/14: cardiac cath w/ DES to the LCX  11/15: arterial line insertion 11/15: Open Reduction and internal fixation of T10 fracture, Posterolateral arthrodesis T7 to T12      ASSESSMENT & PLAN:   STEMI S/p successful LCx stenting 11/14 Echocardiogram no serious abnormality - EF 55-60%, no RWMA, G1 diast df ,mild  elevated pulm artery pressure, no significant valvular disease. Cardiology following ASA, adding back plavix today High potency statin Holding beta blocker and ACE/ARB d/t hypotension in setting of likely spinal shock +/- opiate effect see below    Hypotension Ddx: neurogenic shock in setting of recent trauma/surgery, complicated by opiate adverse effect, less likely bleeding but watching this closely given thrombocytopenia and hematomas S/p pressor support IV fluids continuing given AKI Holding antihypertensives  Active bleeding low suspicion but closely monitoring HH/CBC  AKI on CKD stage IIIa Complicated by hx lupus nephritis and hypotension, recent contrast, hypotension Improving today, 2.25 from baseline of 1.3, from peak of 3s IV fluids this morning Monitor BMP Avoid nephrotoxins  Nephrology following  Unstable thoracic spine fracture T10 s/p T7-12 posterior fusion 11/15  Neurosurgery following. TLSO brace Pain control - limiting opiates d/t hypotension  For SNF, TOC working on that  Thrombocytopenia  Bleeding/hematoma, high bleed risk  Forearm Hematoma noted 11/14 post-cath at TR band site Appears stable, hgb stable   LLQ Abdominal wall hematoma  stable  Urine retention? Catheterized 2 days ago but I see no report of why placed, that he failed CIC, etc. Will d/c foley and monitor bladder scans  Syncope suspect MI associated ventricle arrhythmia vs vasovagal  Pressure too low to consider beta-blocking right now   Scalp laceration Gen surg evaluated, hemostatic and well-approximated, no indication for suture/stabple  Hx of PE/DVT and intracranial venous thrombosis On Eliquis prior to hospitalization  Cardiology advises holding for now, possible resumption in about a month if remains stable   HTN Hold home antihypertensives for now given hypotension   SLE Stable  holding CellCept Continue steroid 10 mg daily  Constipation Reports no bm last 3 days - will  make mirlaax standing and increase dose to 2 packets   DVT prophylaxis: lovenox IV fluids: gentle continuous IV fluids  Nutrition: regular diet   Code Status: FULL CODE ACP documentation reviewed:  none on file in [facility]  TOC needs: snf placement, bed search underway Family communication:see above             Subjective / Brief ROS:  Reports feeling fatigued, stable neck pain, no bm last few days    Family Communication: spoke w/ daughter [name] 11/09/23 3:43 PM all questions answered     Objective Findings:  Vitals:   11/09/23 0730 11/09/23 0815 11/09/23 1144 11/09/23 1400  BP: 125/78 (!) 149/90 (!) 143/86   Pulse: 77 77 80   Resp: 17 16 18    Temp: 98 F (36.7 C) 97.6 F (36.4 C) 98.8 F (37.1 C)   TempSrc: Oral Oral    SpO2: 93% 95% 93% 92%  Weight:      Height:        Intake/Output Summary (Last 24 hours) at 11/09/2023 1543 Last data filed at 11/09/2023 1054 Gross per 24 hour  Intake 769.65 ml  Output 1000 ml  Net -230.35 ml   Filed Weights   11/05/23 1016 11/05/23 1301  Weight: 107.9 kg 102.8 kg    Examination:  NAD RRR CTAB Abdomen soft Bruise left forearm Mild LE swelling       Scheduled Medications:   acetaminophen  1,000 mg Oral Q6H   ARIPiprazole  2 mg Oral Daily   aspirin  81 mg Oral Daily   Chlorhexidine Gluconate Cloth  6 each Topical Daily   clopidogrel  75 mg Oral Daily   docusate sodium  100 mg Oral BID   gabapentin  200 mg Oral TID   lidocaine  2 patch Transdermal Q24H   loratadine  5 mg Oral QPM   montelukast  10 mg Oral QHS   rosuvastatin  20 mg Oral Daily   senna  1 tablet Oral BID   sertraline  100 mg Oral Daily   sodium chloride flush  3 mL Intravenous Q12H    Continuous Infusions:    PRN Medications:  albuterol, LORazepam, magnesium citrate, methocarbamol **OR** methocarbamol (ROBAXIN) injection, ondansetron (ZOFRAN) IV, mouth rinse, oxyCODONE, oxyCODONE, polyethylene glycol, sodium chloride flush,  sorbitol  Antimicrobials from admission:  Anti-infectives (From admission, onward)    None           Data Reviewed:  I have personally reviewed the following...  CBC: Recent Labs  Lab 11/05/23 1015 11/05/23 1757 11/06/23 0053 11/06/23 0425 11/07/23 0352 11/07/23 1637 11/08/23 0403 11/09/23 0410  WBC 8.0 8.8  --  7.6 8.6 8.7 7.6 5.6  NEUTROABS 6.8 7.2  --   --   --   --   --   --   HGB 16.8 14.0   < > 13.0 11.2* 11.4* 10.5* 10.0*  HCT 52.9* 42.9   < > 40.3 34.6* 35.8* 33.8* 31.5*  MCV 93.1 90.5  --  90.6 90.8 94.7 94.9 93.5  PLT 149* 111*  --  110* 118* 112* 102* 101*   < > = values in this interval not displayed.   Basic Metabolic Panel: Recent Labs  Lab 11/05/23 1015 11/05/23 1840 11/06/23 0425 11/07/23 0352 11/07/23 1637 11/08/23 0403 11/09/23 0410  NA 140   < > 136 136 136 135 135  K 3.9   < >  4.4 5.0 5.2* 4.6 4.0  CL 105   < > 105 107 107 106 107  CO2 25   < > 22 20* 21* 22 23  GLUCOSE 119*   < > 110* 147* 126* 108* 110*  BUN 24*   < > 28* 31* 36* 45* 47*  CREATININE 1.64*   < > 1.78* 2.02* 2.89* 3.31* 2.25*  CALCIUM 9.2   < > 8.0* 7.7* 7.7* 7.7* 7.8*  MG 2.1  --  2.0  --   --   --   --   PHOS 2.6  --  3.8  --   --   --   --    < > = values in this interval not displayed.   GFR: Estimated Creatinine Clearance: 44.1 mL/min (A) (by C-G formula based on SCr of 2.25 mg/dL (H)). Liver Function Tests: Recent Labs  Lab 11/05/23 1015 11/05/23 1840  AST 21 17  ALT 19 17  ALKPHOS 73 57  BILITOT 1.2* 1.2*  PROT 7.9 5.9*  ALBUMIN 4.4 3.4*   Recent Labs  Lab 11/05/23 1840  LIPASE 34   No results for input(s): "AMMONIA" in the last 168 hours. Coagulation Profile: Recent Labs  Lab 11/05/23 1015  INR 1.2   Cardiac Enzymes: No results for input(s): "CKTOTAL", "CKMB", "CKMBINDEX", "TROPONINI" in the last 168 hours. BNP (last 3 results) No results for input(s): "PROBNP" in the last 8760 hours. HbA1C: No results for input(s): "HGBA1C" in the  last 72 hours. CBG: Recent Labs  Lab 11/05/23 1256  GLUCAP 93   Lipid Profile: No results for input(s): "CHOL", "HDL", "LDLCALC", "TRIG", "CHOLHDL", "LDLDIRECT" in the last 72 hours.  Thyroid Function Tests: No results for input(s): "TSH", "T4TOTAL", "FREET4", "T3FREE", "THYROIDAB" in the last 72 hours. Anemia Panel: No results for input(s): "VITAMINB12", "FOLATE", "FERRITIN", "TIBC", "IRON", "RETICCTPCT" in the last 72 hours. Most Recent Urinalysis On File:     Component Value Date/Time   COLORURINE YELLOW (A) 11/08/2023 1204   APPEARANCEUR HAZY (A) 11/08/2023 1204   APPEARANCEUR Clear 03/25/2023 0833   LABSPEC 1.017 11/08/2023 1204   LABSPEC 1.013 04/10/2015 1036   PHURINE 5.0 11/08/2023 1204   GLUCOSEU NEGATIVE 11/08/2023 1204   GLUCOSEU Negative 04/10/2015 1036   HGBUR LARGE (A) 11/08/2023 1204   BILIRUBINUR NEGATIVE 11/08/2023 1204   BILIRUBINUR Negative 03/25/2023 0833   BILIRUBINUR Negative 04/10/2015 1036   KETONESUR NEGATIVE 11/08/2023 1204   PROTEINUR NEGATIVE 11/08/2023 1204   UROBILINOGEN 0.2 07/23/2021 1423   UROBILINOGEN 0.2 03/17/2015 0025   NITRITE NEGATIVE 11/08/2023 1204   LEUKOCYTESUR SMALL (A) 11/08/2023 1204   LEUKOCYTESUR Negative 04/10/2015 1036   Sepsis Labs: @LABRCNTIP (procalcitonin:4,lacticidven:4) Microbiology: Recent Results (from the past 240 hour(s))  MRSA Next Gen by PCR, Nasal     Status: None   Collection Time: 11/05/23  1:12 PM   Specimen: Nasal Mucosa; Nasal Swab  Result Value Ref Range Status   MRSA by PCR Next Gen NOT DETECTED NOT DETECTED Final    Comment: (NOTE) The GeneXpert MRSA Assay (FDA approved for NASAL specimens only), is one component of a comprehensive MRSA colonization surveillance program. It is not intended to diagnose MRSA infection nor to guide or monitor treatment for MRSA infections. Test performance is not FDA approved in patients less than 33 years old. Performed at [facility], [address], [place], [name] [address]       Radiology Studies last 3 days: ECHOCARDIOGRAM COMPLETE  Result Date: 11/06/2023    ECHOCARDIOGRAM REPORT  Patient Name:   JOWAN FERREL Date of Exam: 11/06/2023 Medical Rec #:  [id number]      Height:       74.0 in Accession #:    [id number]     Weight:       226.6 lb Date of Birth:  [birth year]     BSA:          2.292 m Patient Age:    61 years       BP:           98/58 mmHg Patient Gender: M              HR:           83 bpm. Exam Location:  [facility] Procedure: 2D Echo, Cardiac Doppler and Color Doppler Indications:     Syncope  History:         Patient has prior history of Echocardiogram examinations, most                  recent 09/10/2021. CAD and Acute MI, COPD,                  Signs/Symptoms:Syncope and Dyspnea; Risk Factors:Hypertension,                  Diabetes and Dyslipidemia. Pulmonary embolus.  Sonographer:     Mikki Harbor Referring Phys:  [phone] CHRISTOPHER END Diagnosing Phys: Debbe Odea MD  Sonographer Comments: Technically difficult study due to poor echo windows, suboptimal subcostal window and suboptimal parasternal window. Image acquisition challenging due to respiratory motion. IMPRESSIONS  1. Left ventricular ejection fraction, by estimation, is 55 to 60%. The left ventricle has normal function. The left ventricle has no regional wall motion abnormalities. There is mild left ventricular hypertrophy. Left ventricular diastolic parameters are consistent with Grade I diastolic dysfunction (impaired relaxation).  2. Right ventricular systolic function is normal. The right ventricular size is mildly enlarged. There is mildly elevated pulmonary artery systolic pressure.  3. The mitral valve is normal in structure. No evidence of mitral valve regurgitation.  4. The aortic valve is grossly normal. Aortic valve regurgitation is not visualized. FINDINGS  Left Ventricle: Left ventricular ejection fraction, by estimation, is 55 to 60%. The left ventricle  has normal function. The left ventricle has no regional wall motion abnormalities. The left ventricular internal cavity size was normal in size. There is  mild left ventricular hypertrophy. Left ventricular diastolic parameters are consistent with Grade I diastolic dysfunction (impaired relaxation). Right Ventricle: The right ventricular size is mildly enlarged. No increase in right ventricular wall thickness. Right ventricular systolic function is normal. There is mildly elevated pulmonary artery systolic pressure. The tricuspid regurgitant velocity is 2.66 m/s, and with an assumed right atrial pressure of 8 mmHg, the estimated right ventricular systolic pressure is 36.3 mmHg. Left Atrium: Left atrial size was normal in size. Right Atrium: Right atrial size was normal in size. Pericardium: There is no evidence of pericardial effusion. Mitral Valve: The mitral valve is normal in structure. No evidence of mitral valve regurgitation. MV peak gradient, 5.7 mmHg. The mean mitral valve gradient is 2.0 mmHg. Tricuspid Valve: The tricuspid valve is normal in structure. Tricuspid valve regurgitation is trivial. Aortic Valve: The aortic valve is grossly normal. Aortic valve regurgitation is not visualized. Aortic valve mean gradient measures 8.0 mmHg. Aortic valve peak gradient measures 11.0 mmHg. Aortic valve area, by VTI measures 1.97 cm. Pulmonic Valve: The pulmonic valve was not well  visualized. Pulmonic valve regurgitation is not visualized. Aorta: The aortic root is normal in size and structure. Venous: The inferior vena cava was not well visualized. IAS/Shunts: No atrial level shunt detected by color flow Doppler.  LEFT VENTRICLE PLAX 2D LVIDd:         4.10 cm   Diastology LVIDs:         3.00 cm   LV e' medial:    9.20 cm/s LV PW:         1.30 cm   LV E/e' medial:  9.8 LV IVS:        1.30 cm   LV e' lateral:   7.94 cm/s LVOT diam:     2.00 cm   LV E/e' lateral: 11.3 LV SV:         69 LV SV Index:   30 LVOT Area:      3.14 cm  RIGHT VENTRICLE RV Basal diam:  3.85 cm RV Mid diam:    3.70 cm RV S prime:     21.80 cm/s TAPSE (M-mode): 2.3 cm LEFT ATRIUM             Index        RIGHT ATRIUM           Index LA diam:        3.30 cm 1.44 cm/m   RA Area:     16.60 cm LA Vol (A2C):   47.8 ml 20.85 ml/m  RA Volume:   49.50 ml  21.60 ml/m LA Vol (A4C):   87.8 ml 38.31 ml/m LA Biplane Vol: 66.6 ml 29.06 ml/m  AORTIC VALVE                     PULMONIC VALVE AV Area (Vmax):    1.99 cm      PV Vmax:       0.96 m/s AV Area (Vmean):   1.64 cm      PV Peak grad:  3.7 mmHg AV Area (VTI):     1.97 cm AV Vmax:           166.00 cm/s AV Vmean:          131.000 cm/s AV VTI:            0.350 m AV Peak Grad:      11.0 mmHg AV Mean Grad:      8.0 mmHg LVOT Vmax:         105.00 cm/s LVOT Vmean:        68.500 cm/s LVOT VTI:          0.219 m LVOT/AV VTI ratio: 0.63  AORTA Ao Root diam: 3.40 cm MITRAL VALVE                TRICUSPID VALVE MV Area (PHT): 4.65 cm     TR Peak grad:   28.3 mmHg MV Area VTI:   2.85 cm     TR Vmax:        266.00 cm/s MV Peak grad:  5.7 mmHg MV Mean grad:  2.0 mmHg     SHUNTS MV Vmax:       1.19 m/s     Systemic VTI:  0.22 m MV Vmean:      67.3 cm/s    Systemic Diam: 2.00 cm MV Decel Time: 163 msec MV E velocity: 90.10 cm/s MV A velocity: 130.00 cm/s MV E/A ratio:  0.69 Debbe Odea MD Electronically signed by Debbe Odea MD  Signature Date/Time: 11/06/2023/5:40:51 PM    Final    DG Thoracic Spine 2 View  Result Date: 11/06/2023 CLINICAL DATA:  Elective surgery. EXAM: THORACIC SPINE 2 VIEWS COMPARISON:  Thoracic spine MRI yesterday FINDINGS: Two fluoroscopic spot views of the thoracic spine as well as intraoperative CT imaging obtained during multilevel thoracic spine fusion. Fluoroscopy time 1 minute 6 seconds. Dose 85.785 mGy. IMPRESSION: Intraoperative fluoroscopy during thoracic spine fusion. Electronically Signed   By: Narda Rutherford M.D.   On: 11/06/2023 16:30   DG C-Arm 1-60 Min-No  Report  Result Date: 11/06/2023 Fluoroscopy was utilized by the requesting physician.  No radiographic interpretation.   MR THORACIC SPINE WO CONTRAST  Result Date: 11/06/2023 CLINICAL DATA:  Initial evaluation for acute mid back pain. EXAM: MRI THORACIC SPINE WITHOUT CONTRAST TECHNIQUE: Multiplanar, multisequence MR imaging of the thoracic spine was performed. No intravenous contrast was administered. COMPARISON:  Prior CT from earlier the same day. FINDINGS: Alignment: Physiologic with preservation of the normal thoracic kyphosis. No listhesis. Vertebrae: Acute transverse fracture extending through the T10 vertebral body again seen. Evidence for extension through the posterior elements on the right (series 19, image 6). Findings consistent with an acute Chance type fracture, and unstable injury by definition. No significant vertebral body height loss. Probable injury/disruption of the anterior longitudinal ligament at this level. Remainder of the major ligamentous structures appear grossly intact. No other acute or recent fracture within the thoracic spine. Mild chronic compression deformity of L1 noted. Bone marrow signal intensity diffusely heterogeneous but overall within normal limits. Few scattered benign hemangiomata noted. No worrisome osseous lesions. No other abnormal marrow edema. Cord: Normal signal and morphology. No evidence for traumatic cord injury. Paraspinal and other soft tissues: Paraspinous soft tissues demonstrate no acute finding. Small layering bilateral pleural effusions. Complex cystic lesion within the partially visualized right hepatic lobe noted, better seen on prior CT. 1.6 cm simple right renal cyst, benign in appearance, no follow-up imaging recommended. Disc levels: Ordinary for age multilevel disc desiccation seen throughout the thoracic spine. No significant disc bulge or focal disc herniation. Mild multilevel facet degeneration. No spinal stenosis. Neural foramina remain  patent. IMPRESSION: 1. Acute transverse fracture extending through the T10 vertebral body, with extension through the posterior elements on the right. Finding consistent with an acute "Chance" type fracture, an unstable injury by definition. No significant vertebral body height loss. Probable injury/disruption of the anterior longitudinal ligament at this level. 2. No other acute traumatic injury within the thoracic spine or spinal cord. 3. Small layering bilateral pleural effusions. Electronically Signed   By: Rise Mu M.D.   On: 11/06/2023 00:57   CT ANGIO CHEST AORTA W/CM &/OR WO/CM  Result Date: 11/06/2023 CLINICAL DATA:  Chest trauma, posterior mediastinal stranding on CT abdomen/pelvis EXAM: CT ANGIOGRAPHY CHEST WITH CONTRAST TECHNIQUE: Multidetector CT imaging of the chest was performed using the standard protocol during bolus administration of intravenous contrast. Multiplanar CT image reconstructions and MIPs were obtained to evaluate the vascular anatomy. RADIATION DOSE REDUCTION: This exam was performed according to the departmental dose-optimization program which includes automated exposure control, adjustment of the mA and/or kV according to patient size and/or use of iterative reconstruction technique. CONTRAST:  OMNIPAQUE IOHEXOL 350 MG/ML SOLN COMPARISON:  MRI thoracic spine dated 11/05/2023. CT abdomen/pelvis dated 11142 than 24. FINDINGS: Cardiovascular: On unenhanced CT, there is no evidence of intramural hematoma. Following contrast administration, there is no evidence of traumatic aortic injury. Mild atherosclerotic calcifications of the aortic arch. Although  not tailored for evaluation of the pulmonary arteries, there is no evidence of central pulmonary embolism to the lobar level. The heart is normal in size.  No pericardial effusion. Moderate three-vessel coronary atherosclerosis. Mediastinum/Nodes: No evidence of anterior mediastinal hematoma. No suspicious mediastinal  lymphadenopathy. Mild prevertebral stranding/hemorrhage at T10 (series 4/image 46), related to the vertebral body fracture at this level, better evaluated on MR. Visualized thyroid is unremarkable. Lungs/Pleura: Mild dependent atelectasis in the bilateral lower lobes. No focal consolidation or aspiration. No suspicious pulmonary nodules. No pleural effusion or pneumothorax. Upper Abdomen: Evaluated on recent CT abdomen/pelvis. Musculoskeletal: Dedicated thoracic spine evaluation (with T10 fracture) is better evaluated on recent MR thoracic spine. Otherwise, no fracture is seen. Review of the MIP images confirms the above findings. IMPRESSION: T10 vertebral body fracture, better evaluated on recent MRI thoracic spine. Associated mild prevertebral stranding/hemorrhage at this level, corresponding to the finding on recent CT abdomen/pelvis. No evidence of traumatic aortic injury. Aortic Atherosclerosis (ICD10-I70.0). Electronically Signed   By: Charline Bills M.D.   On: 11/06/2023 00:55   CT ABDOMEN PELVIS WO CONTRAST  Result Date: 11/05/2023 CLINICAL DATA:  Abdominal trauma, blunt r/o hematoma EXAM: CT ABDOMEN AND PELVIS WITHOUT CONTRAST TECHNIQUE: Multidetector CT imaging of the abdomen and pelvis was performed following the standard protocol without IV contrast. RADIATION DOSE REDUCTION: This exam was performed according to the departmental dose-optimization program which includes automated exposure control, adjustment of the mA and/or kV according to patient size and/or use of iterative reconstruction technique. COMPARISON:  CT abdomen pelvis 03/17/2018, MRI 09/30/2023 FINDINGS: Lower chest: Stranding of the visualized posterior distal mediastinum. Coronary calcification. Mitral annular calcification. Hepatobiliary: Chronic 9.4 x 8.2 cm right posterior hepatic lobe fluid dense lesion with thin septations and associated calcification. Possible vicarious excretion of contrast via the gallbladder. No  gallstones, gallbladder wall thickening, or pericholecystic fluid. No biliary dilatation. Pancreas: No focal lesion. Normal pancreatic contour. No surrounding inflammatory changes. No main pancreatic ductal dilatation. Spleen: Normal in size without focal abnormality. Adrenals/Urinary Tract: No adrenal nodule bilaterally. No nephrolithiasis and no hydronephrosis. No definite contour-deforming renal mass. No ureterolithiasis or hydroureter. Renal excretion of previously administered intravenous contrast. The urinary bladder is unremarkable. Stomach/Bowel: Stomach is within normal limits. No evidence of bowel wall thickening or dilatation. Colonic diverticulosis. Appendix appears normal. Vascular/Lymphatic: No abdominal aorta or iliac aneurysm. Mild atherosclerotic plaque of the aorta and its branches. No abdominal, pelvic, or inguinal lymphadenopathy. Reproductive: The prostate is enlarged measuring up to 4.9 cm. Other: No intraperitoneal free fluid. No intraperitoneal free gas. No organized fluid collection. Musculoskeletal: Left lower anterior abdominal wall subcutaneus soft tissue edema and hematoma formation. Associated subcutaneus soft tissue emphysema. No suspicious lytic or blastic osseous lesions. Acute transverse fracture through the T10 anterior wall and contiguous osteophytes (5:69, 6:68). Query similar finding along the T11 inferior endplate (6:68). Surgical hardware fixation of the left proximal femur. Left L5-S1 posterolateral and anterior surgical hardware. Chronic L4 superior endplate compression fracture status post kyphoplasty. IMPRESSION: 1. Fat stranding of the visualized posterior distal mediastinum. Finding likely related to thoracic spine fracture below. Recommend CT chest with intravenous contrast for further evaluation. Differential diagnosis includes inflammation, aortic injury, esophageal injury. 2. Distraction injury of the thoracic spine. Recommend thoracolumbar MRI for further evaluation  of possible underlying ligamentous injury. Acute transverse fracture through the T10 vertebral body anterior wall and contiguous osteophytes. Query similar finding along the T11 inferior endplate. Colonic diverticulosis with no acute diverticulitis. 3. Left lower anterior abdominal wall subcutaneus soft  tissue edema and hematoma formation. Associated subcutaneus soft tissue emphysema. No retained radiopaque foreign body. 4. Prostatomegaly. 5.  Aortic Atherosclerosis (ICD10-I70.0). These results will be called to the ordering clinician or representative by the Radiologist Assistant, and communication documented in the PACS or [employer]. Electronically Signed   By: Tish Frederickson M.D.   On: 11/05/2023 22:20       Time spent: 50 min     Silvano Bilis, DO [facility] 11/09/2023, 3:43 PM    Dictation software may have been used to generate the above note. Typos may occur and escape review in typed/dictated notes. Please contact Dr Lyn Hollingshead directly for clarity if needed.  Staff may message me via secure chat in Epic  but this may not receive an immediate response,  please page me for urgent matters!  If 7PM-7AM, please contact night coverage [email or web address]

## 2023-11-09 NOTE — TOC Progression Note (Signed)
Transition of Care [facility]) - Progression Note    Patient Details  Name: Jonathon Snow MRN: [id number] Date of Birth: 01-25-1961  Transition of Care [facility]) CM/SW Contact  Darolyn Rua, [name] Phone Number: 11/09/2023, 10:44 AM  Clinical Narrative:     CSW spoke with patent regarding SNF recommendations, he reports being agreeable for referrals to be sent out for bed offers. Reports he currently lives home alone PCP Danelle Berry PA  Referrals sent pending bed offers       Expected Discharge Plan and Services                                               Social Determinants of Health (SDOH) Interventions SDOH Screenings   Food Insecurity: No Food Insecurity (02/19/2023)  Housing: Low Risk  (02/19/2023)  Transportation Needs: No Transportation Needs (02/19/2023)  Utilities: Not At Risk (02/19/2023)  Alcohol Screen: Low Risk  (10/28/2023)  Depression (PHQ2-9): Low Risk  (10/28/2023)  Social Connections: Unknown (04/25/2022)   Received from [facility], [facility]  Tobacco Use: Low Risk  (10/28/2023)    Readmission Risk Interventions     No data to display

## 2023-11-09 NOTE — Progress Notes (Signed)
Physical Therapy Treatment Patient Details Name: Jonathon Snow MRN: [id number] DOB: [birth year] Today's Date: 11/09/2023   History of Present Illness Pt is a 62 yo male that presented with sudden lightheadness and syncope, admitted with new inferior ST segment elevation, underwent emergent cardiac cath and PCI, also s/p  open reduction and internal fixation of T10 fracture, posterior lateral arthrodesis T7-T12. PMH of COPD, HTN, CAD, past MI, PVD, DVT, cerbral venous sinus thrombosis,  lupus, lymphedema, PE, renal disorder stage III, back surgery    PT Comments  Pt motivated to participate. Exercises written on white board and encouraged pt and son for participation. Pt able to tolerate 2 bouts of static standing, limited by dizziness/fatigue/pain. RN in room and able to change dressings. Pt still requires +2 assist for all mobility. Educated on log rolling, donning back brace and expectations while in acute care. Will continue to progress as able.    If plan is discharge home, recommend the following: Two people to help with walking and/or transfers;Two people to help with bathing/dressing/bathroom;Direct supervision/assist for medications management;Help with stairs or ramp for entrance;Assist for transportation;Assistance with feeding;Assistance with cooking/housework   Can travel by private vehicle     No  Equipment Recommendations   (unsure at this time)    Recommendations for Other Services       Precautions / Restrictions Precautions Precautions: Back;Fall;Other (comment) Precaution Comments: A line removed, s/p R radial cath Restrictions Weight Bearing Restrictions: Yes RUE Weight Bearing: Non weight bearing LUE Weight Bearing: Weight bearing as tolerated     Mobility  Bed Mobility Overal bed mobility: Needs Assistance Bed Mobility: Rolling, Supine to Sit, Sit to Supine Rolling: Max assist, +2 for safety/equipment   Supine to sit: Max assist, +2 for physical  assistance Sit to supine: +2 for physical assistance, Max assist   General bed mobility comments: follows commands well. Able to perform log rolling, needs heavy +2 assist. Once seated at EOB, complains of dizziness and pre syncope symptoms. Wet washcloth and water provided. Increased time for symptoms to resolve.    Transfers Overall transfer level: Needs assistance Equipment used: 2 person hand held assist Transfers: Sit to/from Stand Sit to Stand: Max assist, +2 safety/equipment, From elevated surface           General transfer comment: agreeable to 2 separate sit<>Stands with seated rest break between bouts. Able to stand ~30 seconds with very effortful/painful initiation. Unable to weight shift or perform pre gait activities secondary to dizziness. Once seated, TLSO brace donned.    Ambulation/Gait               General Gait Details: unable to tolerate secondary to fatigue/dizziness.   Stairs             Wheelchair Mobility     Tilt Bed    Modified Rankin (Stroke Patients Only)       Balance Overall balance assessment: Needs assistance Sitting-balance support: Feet supported, No upper extremity supported Sitting balance-Leahy Scale: Fair     Standing balance support: Single extremity supported Standing balance-Leahy Scale: Fair                              Cognition Arousal: Alert Behavior During Therapy: WFL for tasks assessed/performed Overall Cognitive Status: Within Functional Limits for tasks assessed  General Comments: alert and pleasant. Agreeable to session        Exercises Other Exercises Other Exercises: discussed PLB and monitored HR with activity elevating to 83bpm with exertion    General Comments        Pertinent Vitals/Pain Pain Assessment Pain Assessment: 0-10 Pain Score: 4  Pain Location: midline back Pain Descriptors / Indicators: Aching, Moaning, Sharp,  Guarding, Grimacing Pain Intervention(s): Limited activity within patient's tolerance, Patient requesting pain meds-RN notified, RN gave pain meds during session    Home Living                          Prior Function            PT Goals (current goals can now be found in the care plan section) Acute Rehab PT Goals Patient Stated Goal: to have less pain PT Goal Formulation: With patient Time For Goal Achievement: 11/22/23 Potential to Achieve Goals: Good Progress towards PT goals: Progressing toward goals    Frequency    7X/week      PT Plan      Co-evaluation              AM-PAC PT "6 Clicks" Mobility   Outcome Measure  Help needed turning from your back to your side while in a flat bed without using bedrails?: A Lot Help needed moving from lying on your back to sitting on the side of a flat bed without using bedrails?: A Lot Help needed moving to and from a bed to a chair (including a wheelchair)?: A Lot Help needed standing up from a chair using your arms (e.g., wheelchair or bedside chair)?: A Lot Help needed to walk in hospital room?: Total Help needed climbing 3-5 steps with a railing? : Total 6 Click Score: 10    End of Session Equipment Utilized During Treatment: Back brace Activity Tolerance: Patient limited by pain Patient left: in bed;with call bell/phone within reach;with bed alarm set Nurse Communication: Mobility status PT Visit Diagnosis: Difficulty in walking, not elsewhere classified (R26.2);Other abnormalities of gait and mobility (R26.89);Muscle weakness (generalized) (M62.81);Pain     Time: 5621-3086 PT Time Calculation (min) (ACUTE ONLY): 29 min  Charges:    $Therapeutic Activity: 23-37 mins PT General Charges $$ ACUTE PT VISIT: 1 Visit                     Elizabeth Palau, PT, DPT, GCS [phone]    Cherril Hett 11/09/2023, 2:16 PM

## 2023-11-09 NOTE — Progress Notes (Signed)
[place]athon Snow, [place] 11/09/23  Subjective:   Hospital day # 4  Patient known to our practice from office follow-up for lupus nephritis.  Last seen in October 2024.  He presented via EMS from home with code STEMI.  Per notes, he was cooking outside and felt dizzy and had a syncopal episode.  He sustained a laceration to the posterior part of his head. He underwent emergent coronary angiography and was found to have multivessel coronary artery disease.  He underwent PCI to left circumflex.  CT scan of the abdomen and pelvis showed thoracic spine fracture, left lower anterior abdominal wall subcutaneous hematoma.  He then underwent CT angiography of the chest which confirmed T10 vertebral body fracture with associated mild prevertebral stranding and hemorrhage.    He was evaluated by neurosurgery..  Because of various thoracic spine injuries, he is deemed to be extremely high risk for spinal cord injury.  T7-T12 fixation of the fracture and fusion of the thoracic spine was recommended.Anticoagulation with apixaban and aspirin and clopidogrel is on hold.  Patient underwent successful open reduction internal fixation of T10 fracture, posterior lateral arthrodesis T7-T12 on 11/06/2023.  Unfortunately patient has developed acute kidney injury.  His creatinine has increased from Baseline 1.35/GFR 60 on 09/29/2023   Update: Renal function improved this a.m.  BUN currently 47 with a creatinine 2.25.  Potassium also down to 4.0.   Objective:  Vital signs in last 24 hours:  Temp:  [97.6 F (36.4 C)-98.7 F (37.1 C)] 97.6 F (36.4 C) (11/18 0815) Pulse Rate:  [76-91] 77 (11/18 0815) Resp:  [8-17] 17 (11/18 0730) BP: (94-149)/(53-90) 149/90 (11/18 0815) SpO2:  [90 %-97 %] 95 % (11/18 0815) Arterial Line BP: (104-144)/(49-66) 144/65 (11/17 1200)  Weight change:  Filed Weights   11/05/23 1016 11/05/23 1301  Weight: 107.9 kg 102.8 kg    Intake/Output:     Intake/Output Summary (Last 24 hours) at 11/09/2023 0843 Last data filed at 11/09/2023 0505 Gross per 24 hour  Intake 1009.65 ml  Output 1200 ml  Net -190.35 ml     Physical Exam: General: No acute distress, laying in the bed  HEENT Moist oral mucous membranes  Pulm/lungs Normal breathing effort  CVS/Heart S1S2 no rubs  Abdomen:  Soft, nontender  Extremities: No edema  Neurologic: Alert, oriented  Skin: Left groin hematoma, right forearm hematoma  Foley In place       Basic Metabolic Panel:  Recent Labs  Lab 11/05/23 1015 11/05/23 1840 11/06/23 0425 11/07/23 0352 11/07/23 1637 11/08/23 0403 11/09/23 0410  NA 140   < > 136 136 136 135 135  K 3.9   < > 4.4 5.0 5.2* 4.6 4.0  CL 105   < > 105 107 107 106 107  CO2 25   < > 22 20* 21* 22 23  GLUCOSE 119*   < > 110* 147* 126* 108* 110*  BUN 24*   < > 28* 31* 36* 45* 47*  CREATININE 1.64*   < > 1.78* 2.02* 2.89* 3.31* 2.25*  CALCIUM 9.2   < > 8.0* 7.7* 7.7* 7.7* 7.8*  MG 2.1  --  2.0  --   --   --   --   PHOS 2.6  --  3.8  --   --   --   --    < > = values in this interval not displayed.     CBC: Recent Labs  Lab 11/05/23 1015 11/05/23 1757 11/06/23 0053 11/06/23  0425 11/07/23 0352 11/07/23 1637 11/08/23 0403 11/09/23 0410  WBC 8.0 8.8  --  7.6 8.6 8.7 7.6 5.6  NEUTROABS 6.8 7.2  --   --   --   --   --   --   HGB 16.8 14.0   < > 13.0 11.2* 11.4* 10.5* 10.0*  HCT 52.9* 42.9   < > 40.3 34.6* 35.8* 33.8* 31.5*  MCV 93.1 90.5  --  90.6 90.8 94.7 94.9 93.5  PLT 149* 111*  --  110* 118* 112* 102* 101*   < > = values in this interval not displayed.     No results found for: "HEPBSAG", "HEPBSAB", "HEPBIGM"    Microbiology:  Recent Results (from the past 240 hour(s))  MRSA Next Gen by PCR, Nasal     Status: Snow   Collection Time: 11/05/23  1:12 PM   Specimen: Nasal Mucosa; Nasal Swab  Result Value Ref Range Status   MRSA by PCR Next Gen NOT DETECTED NOT DETECTED Final    Comment: (NOTE) The  GeneXpert MRSA Assay (FDA approved for NASAL specimens only), is one component of a comprehensive MRSA colonization surveillance program. It is not intended to diagnose MRSA infection nor to guide or monitor treatment for MRSA infections. Test performance is not FDA approved in patients less than 32 years old. Performed at [facility], [address] [place], [place] [address]     Coagulation Studies: No results for input(s): "LABPROT", "INR" in the last 72 hours.   Urinalysis: Recent Labs    11/08/23 1204  COLORURINE YELLOW*  LABSPEC 1.017  PHURINE 5.0  GLUCOSEU NEGATIVE  HGBUR LARGE*  BILIRUBINUR NEGATIVE  KETONESUR NEGATIVE  PROTEINUR NEGATIVE  NITRITE NEGATIVE  LEUKOCYTESUR SMALL*      Imaging: No results found.   Medications:    promethazine (PHENERGAN) injection (IM or IVPB) Stopped (11/05/23 2050)    acetaminophen  1,000 mg Oral Q6H   ARIPiprazole  2 mg Oral Daily   aspirin  81 mg Oral Daily   Chlorhexidine Gluconate Cloth  6 each Topical Daily   docusate sodium  100 mg Oral BID   gabapentin  200 mg Oral TID   lidocaine  2 patch Transdermal Q24H   loratadine  5 mg Oral QPM   montelukast  10 mg Oral QHS   rosuvastatin  20 mg Oral Daily   senna  1 tablet Oral BID   sertraline  100 mg Oral Daily   sodium chloride flush  3 mL Intravenous Q12H   albuterol, LORazepam, magnesium citrate, methocarbamol **OR** methocarbamol (ROBAXIN) injection, ondansetron (ZOFRAN) IV, mouth rinse, oxyCODONE, oxyCODONE, polyethylene glycol, sodium chloride flush, sorbitol  Assessment/ Plan:  62 y.o. male with lupus nephritis, hypertension, mild CKD, lymphedema, secondary hyperparathyroidism, history of lumbar herpes zoster infection, history of pulmonary embolus with IVC filter placement, right ankle fracture status post ORIF, herniated disc status post L5-S1 fusion history of right axillary and left groin abscess in May 2018    admitted on 11/05/2023 for STEMI (ST  elevation myocardial infarction) [facility]) [I21.3]  Hospital course is complicated by Scalp laceration, left circumflex PCI with stent placement, thoracic spine fracture status post T7-T12 fixation and thoracic spine fusion by neurosurgery this admission, acute kidney injury  1.  Acute kidney injury on chronic kidney disease stage 3a with underlying lupus nephritis, hypotension.  Lupus nephritis has been managed with CellCept (1000 mg twice a day), hydroxychloroquine 200 mg p.o. daily, losartan 100 mg p.o. daily, prednisone 10 mg daily  as outpatient. Most recent outpatient labs-09/29/2023-creatinine 1.36, GFR 59 AKI likely secondary to hypotension and multiple IV contrast exposures. Update: Renal function improved today as creatinine down to 2.25.  Serum potassium acceptable at 4.0 with a serum bicarbonate of 23.  Continue supportive care for now as renal function improving.  I did advise him to increase p.o. fluid intake and he verbalized understanding.  2.  Anemia of chronic kidney disease.  Hemoglobin 10.0.  No indication for Epogen at the moment.    LOS: 4 Devona Holmes 11/18/20248:43 AM  [facility] [place], [place] [phone]  Note: This note was prepared with Dragon dictation. Any transcription errors are unintentional

## 2023-11-09 NOTE — Progress Notes (Signed)
Rounding Note    Patient Name: [place]athon Snow Date of Encounter: 11/09/2023  [facility] Cardiologist: Yvonne Kendall, MD   Subjective    Neurosurgery on 11/15 open reduction and internal fixation of T10 fracture, posterior lateral arthrodesis T7-T12  He reports no chest pain.  He does complain of shortness of breath.  He appears to be very deconditioned.  Inpatient Medications    Scheduled Meds:  acetaminophen  1,000 mg Oral Q6H   ARIPiprazole  2 mg Oral Daily   aspirin  81 mg Oral Daily   Chlorhexidine Gluconate Cloth  6 each Topical Daily   docusate sodium  100 mg Oral BID   gabapentin  200 mg Oral TID   lidocaine  2 patch Transdermal Q24H   loratadine  5 mg Oral QPM   montelukast  10 mg Oral QHS   rosuvastatin  20 mg Oral Daily   senna  1 tablet Oral BID   sertraline  100 mg Oral Daily   sodium chloride flush  3 mL Intravenous Q12H   Continuous Infusions:   PRN Meds: albuterol, LORazepam, magnesium citrate, methocarbamol **OR** methocarbamol (ROBAXIN) injection, ondansetron (ZOFRAN) IV, mouth rinse, oxyCODONE, oxyCODONE, polyethylene glycol, sodium chloride flush, sorbitol   Vital Signs    Vitals:   11/09/23 0700 11/09/23 0730 11/09/23 0815 11/09/23 1144  BP: 128/79 125/78 (!) 149/90 (!) 143/86  Pulse: 76 77 77 80  Resp: 10 17 16 18   Temp:  98 F (36.7 C) 97.6 F (36.4 C) 98.8 F (37.1 C)  TempSrc:  Oral Oral   SpO2: 93% 93% 95% 93%  Weight:      Height:        Intake/Output Summary (Last 24 hours) at 11/09/2023 1256 Last data filed at 11/09/2023 1054 Gross per 24 hour  Intake 769.65 ml  Output 1100 ml  Net -330.35 ml      11/05/2023    1:01 PM 11/05/2023   10:16 AM 10/28/2023    1:32 PM  Last 3 Weights  Weight (lbs) 226 lb 10.1 oz 237 lb 14 oz 230 lb 8 oz  Weight (kg) 102.8 kg 107.9 kg 104.554 kg      Telemetry    Normal sinus rhythm- Personally Reviewed  ECG     - Personally Reviewed  Physical Exam    Constitutional:  oriented to person, place, and time. No distress.  HENT:  Head: Grossly normal Eyes:  no discharge. No scleral icterus.  Neck: No JVD, no carotid bruits  Cardiovascular: Regular rate and rhythm, no murmurs appreciated Pulmonary/Chest: Clear to auscultation bilaterally, no wheezes or rails Abdominal: Soft.  no distension.  no tenderness.  Musculoskeletal: Normal range of motion Neurological:  normal muscle tone. Coordination normal. No atrophy Skin: Skin warm and dry large region ecchymotic bruising left groin Psychiatric: normal affect, pleasant There is bruising on the right forearm at the site of radial cath.   Labs    High Sensitivity Troponin:   Recent Labs  Lab 11/05/23 1015 11/05/23 1346 11/05/23 1757 11/05/23 1840  TROPONINIHS 8 6 10 10      Chemistry Recent Labs  Lab 11/05/23 1015 11/05/23 1840 11/06/23 0425 11/07/23 0352 11/07/23 1637 11/08/23 0403 11/09/23 0410  NA 140 137 136   < > 136 135 135  K 3.9 4.0 4.4   < > 5.2* 4.6 4.0  CL 105 109 105   < > 107 106 107  CO2 25 22 22    < > 21* 22 23  GLUCOSE 119* 96 110*   < > 126* 108* 110*  BUN 24* 24* 28*   < > 36* 45* 47*  CREATININE 1.64* 1.50* 1.78*   < > 2.89* 3.31* 2.25*  CALCIUM 9.2 7.9* 8.0*   < > 7.7* 7.7* 7.8*  MG 2.1  --  2.0  --   --   --   --   PROT 7.9 5.9*  --   --   --   --   --   ALBUMIN 4.4 3.4*  --   --   --   --   --   AST 21 17  --   --   --   --   --   ALT 19 17  --   --   --   --   --   ALKPHOS 73 57  --   --   --   --   --   BILITOT 1.2* 1.2*  --   --   --   --   --   GFRNONAA 47* 53* 43*   < > 24* 20* 32*  ANIONGAP 10 6 9    < > 8 7 5    < > = values in this interval not displayed.    Lipids  Recent Labs  Lab 11/05/23 1012  CHOL 199  TRIG 105  HDL 59  LDLCALC 119*  CHOLHDL 3.4    Hematology Recent Labs  Lab 11/07/23 1637 11/08/23 0403 11/09/23 0410  WBC 8.7 7.6 5.6  RBC 3.78* 3.56* 3.37*  HGB 11.4* 10.5* 10.0*  HCT 35.8* 33.8* 31.5*  MCV 94.7 94.9  93.5  MCH 30.2 29.5 29.7  MCHC 31.8 31.1 31.7  RDW 15.9* 15.9* 15.7*  PLT 112* 102* 101*   Thyroid No results for input(s): "TSH", "FREET4" in the last 168 hours.  BNPNo results for input(s): "BNP", "PROBNP" in the last 168 hours.  DDimer No results for input(s): "DDIMER" in the last 168 hours.   Radiology    No results found.  Cardiac Studies     Patient Profile      [name] is a 62 year old man with sudden lightheadedness and syncope , thought to have inferior STEMI with borderline EKG changes  prompting emergent cardiac catheterization and PCI to distal LCx.  Troponin remained negative Complex T10 fracture requiring neurosurgery the day after stent placement.    Assessment & Plan    Syncope with possible unstable angina: Borderline inferior ST changes but troponin remained negative. Status post PCI to the distal left circumflex.  The patient was found to have calcified multivessel disease which appeared chronic. Continue low-dose aspirin. I messaged neurosurgery regarding resuming clopidogrel today.  Syncope Mechanism unclear, described as frank syncope Ejection fraction was normal by echo and troponin remained negative.  Thus, unlikely to be due to arrhythmia.  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 I favor dual antiplatelet therapy with aspirin and clopidogrel for now.  After 1 month, aspirin can be stopped and Eliquis resumed.  Hypotension Exacerbated by pain medication, anesthesia, back surgery His blood pressure improved and is mildly elevated today.  Acute on chronic renal failure Worsening creatinine now greater than 3 Gradually improving and is down to 2.25 today.  Peak creatinine  was 3.31.     For  questions or updates, please contact [facility] Please consult [email or web address] for contact info under        Signed, Lorine Bears, MD  11/09/2023, 12:56 PM

## 2023-11-09 NOTE — Progress Notes (Signed)
   Neurosurgery Progress Note  History: [place] Snow is s/p T7-T12 percutaneous PSF for T10 fracture  POD3: continued back pain this morning. Pt denies any radiating leg pain or associated numbness or tingling  Physical Exam: Vitals:   11/09/23 0500 11/09/23 0600  BP: 115/76 124/81  Pulse: 78 77  Resp: 10 (!) 8  Temp: 98.1 F (36.7 C)   SpO2: 93% 92%    AA Ox3 CNI  Strength:5/5 throughout BLE Sensation intact   Assessment/Plan:  [place] Snow is a 62 y.o presenting after a fall resulting in T10 fracture s/p T7-T12 PSF.   - mobilize - pain control - DVT prophylaxis - PTOT. TLSO brace at bedside   Manning Charity PA-C Department of Neurosurgery

## 2023-11-09 NOTE — Progress Notes (Signed)
OT Cancellation Note  Patient Details Name: [place] Snow MRN: [id number] DOB: [birth year]   Cancelled Treatment:    Reason Eval/Treat Not Completed: Patient declined, reports he was just falling asleep and would like to do therapy, just not right now. OT edu on importance of therapy for his recovery, however pt still politely declining. Will re-attempt at later date/time following POC.  Constance Goltz 11/09/2023, 3:51 PM

## 2023-11-09 NOTE — Plan of Care (Signed)
  Problem: Health Behavior/Discharge Planning: Goal: Ability to manage health-related needs will improve Outcome: Progressing   Problem: Clinical Measurements: Goal: Ability to maintain clinical measurements within normal limits will improve Outcome: Progressing Goal: Will remain free from infection Outcome: Progressing Goal: Respiratory complications will improve Outcome: Progressing   Problem: Elimination: Goal: Will not experience complications related to urinary retention Outcome: Progressing   Problem: Pain Management: Goal: General experience of comfort will improve Outcome: Progressing   Problem: Activity: Goal: Risk for activity intolerance will decrease Outcome: Not Progressing

## 2023-11-09 NOTE — Discharge Instructions (Addendum)
NEUROSURGERY DISCHARGE INSTRUCTIONS  Admission diagnosis: STEMI (ST elevation myocardial infarction) (HCC) [I21.3]  Operative procedure: T7-T12 posterior spinal fusion  What to do after you leave the hospital:  Recommended diet: regular diet. Increase protein intake to promote wound healing.  Recommended activity: no lifting, driving, or strenuous exercise for 4 weeks . You should walk multiple times per day  Special Instructions  No straining, no heavy lifting > 10lbs x 4 weeks.  Keep incision area clean and dry. May shower in 2 days. No baths or pools for 6 weeks.  Please remove dressing tomorrow, no need to apply a bandage afterwards  You have sutures or staples that will be removed in clinic.   Please take pain medications as directed. Take a stool softener if on pain medications   Please Report any of the following: Nausea or Vomiting, Temperature is greater than 101.75F (38.1C) degrees, Dizziness, Abdominal Pain, Difficulty Breathing or Shortness of Breath, Inability to Eat, drink Fluids, or Take medications, Bleeding, swelling, or drainage from surgical incision sites, New numbness or weakness, and Bowel or bladder dysfunction to the neurosurgeon on call. How to contact us:  If you have any questions/concerns before or after surgery, you can reach [place] at [phone], or you can send a mychart message. We can be reached by phone or mychart 8am-4pm, Monday-Friday.  *Please note: Calls after 4pm are forwarded to a third party answering service. Mychart messages are not routinely monitored during evenings, weekends, and holidays. Please call our office to contact the answering service for urgent concerns during non-business hours.   Additional Follow up appointments Please follow up with Joan Flores PA-C as scheduled in 2-3 weeks   Please see below for scheduled appointments:  Future Appointments  Date Time Provider Department Center  11/23/2023  2:30 PM Joan Flores, PA-C  [facility] None  12/22/2023  3:30 PM Venetia Night, MD [facility] None  01/28/2024 10:30 AM Susanne Borders, PA [facility] None  02/16/2024  8:45 AM Wyn Quaker, Marlow Baars, MD [facility] None  03/18/2024  8:15 AM B[facility]LAB [facility] None  03/29/2024  8:45 AM Vanna Scotland, MD [facility] None  03/29/2024 10:15 AM [facility] LAB [facility] None  03/29/2024 10:30 AM Earna Coder, MD [facility] None

## 2023-11-09 NOTE — Plan of Care (Signed)
  Problem: Clinical Measurements: Goal: Ability to maintain clinical measurements within normal limits will improve Outcome: Progressing   Problem: Clinical Measurements: Goal: Respiratory complications will improve Outcome: Progressing   Problem: Clinical Measurements: Goal: Cardiovascular complication will be avoided Outcome: Progressing   Problem: Pain Management: Goal: Pain level will decrease Outcome: Progressing

## 2023-11-10 ENCOUNTER — Encounter: Payer: Medicaid Other | Admitting: Occupational Therapy

## 2023-11-10 DIAGNOSIS — I200 Unstable angina: Secondary | ICD-10-CM

## 2023-11-10 LAB — CBC
HCT: 31.7 % — ABNORMAL LOW (ref 39.0–52.0)
Hemoglobin: 10.2 g/dL — ABNORMAL LOW (ref 13.0–17.0)
MCH: 29.6 pg (ref 26.0–34.0)
MCHC: 32.2 g/dL (ref 30.0–36.0)
MCV: 91.9 fL (ref 80.0–100.0)
Platelets: 122 10*3/uL — ABNORMAL LOW (ref 150–400)
RBC: 3.45 MIL/uL — ABNORMAL LOW (ref 4.22–5.81)
RDW: 15.5 % (ref 11.5–15.5)
WBC: 4.6 10*3/uL (ref 4.0–10.5)
nRBC: 0 % (ref 0.0–0.2)

## 2023-11-10 LAB — BASIC METABOLIC PANEL
Anion gap: 8 (ref 5–15)
BUN: 36 mg/dL — ABNORMAL HIGH (ref 8–23)
CO2: 21 mmol/L — ABNORMAL LOW (ref 22–32)
Calcium: 8.2 mg/dL — ABNORMAL LOW (ref 8.9–10.3)
Chloride: 109 mmol/L (ref 98–111)
Creatinine, Ser: 1.36 mg/dL — ABNORMAL HIGH (ref 0.61–1.24)
GFR, Estimated: 59 mL/min — ABNORMAL LOW (ref >60)
Glucose, Bld: 97 mg/dL (ref 70–99)
Potassium: 3.9 mmol/L (ref 3.5–5.1)
Sodium: 138 mmol/L (ref 135–145)

## 2023-11-10 LAB — C3 COMPLEMENT: C3 Complement: 100 mg/dL (ref 82–167)

## 2023-11-10 LAB — C4 COMPLEMENT: Complement C4, Body Fluid: 17 mg/dL (ref 12–38)

## 2023-11-10 LAB — ANTI-DNA ANTIBODY, DOUBLE-STRANDED: ds DNA Ab: 2 [IU]/mL (ref 0–9)

## 2023-11-10 MED ORDER — APIXABAN 2.5 MG PO TABS
2.5000 mg | ORAL_TABLET | Freq: Two times a day (BID) | ORAL | Status: DC
  Administered 2023-11-10 – 2023-11-13 (×6): 2.5 mg via ORAL
  Filled 2023-11-10 (×6): qty 1

## 2023-11-10 MED ORDER — LACTULOSE 10 GM/15ML PO SOLN
30.0000 g | Freq: Once | ORAL | Status: AC
  Administered 2023-11-10: 30 g via ORAL
  Filled 2023-11-10: qty 60

## 2023-11-10 NOTE — TOC Progression Note (Signed)
Transition of Care [facility]) - Progression Note    Patient Details  Name: Jonathon Snow MRN: [id number] Date of Birth: December 12, 1961  Transition of [facility]) CM/SW Contact  Darolyn Rua, [name] Phone Number: 11/10/2023, 9:57 AM  Clinical Narrative:     CSW spoke with patient's daughter [name] regarding SNF, informed her that we had sent out referrals yesterday locally however all local facilities have declined due to patient's insurance. She's agreeable for referrals to be sent out to larger geographical area. Additional referrals sent.   Pending bed offers at this time.        Expected Discharge Plan and Services                                               Social Determinants of Health (SDOH) Interventions SDOH Screenings   Food Insecurity: No Food Insecurity (11/10/2023)  Housing: Low Risk  (02/19/2023)  Transportation Needs: No Transportation Needs (11/10/2023)  Utilities: Not At Risk (11/10/2023)  Alcohol Screen: Low Risk  (10/28/2023)  Depression (PHQ2-9): Low Risk  (10/28/2023)  Social Connections: Unknown (04/25/2022)   Received from [facility], [facility]  Tobacco Use: Low Risk  (10/28/2023)    Readmission Risk Interventions     No data to display

## 2023-11-10 NOTE — Progress Notes (Signed)
PROGRESS NOTE    Jonathon Snow   ZHY:[id number] DOB: March 15, 1961  DOA: 11/05/2023 Date of Service: 11/10/23 which is hospital day 5  PCP: Danelle Berry, PA-C    HPI: Jonathon Snow is a 62 y.o. 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 Patient suddenly felt lightheadedness while cooking breakfast alone at home and then passed out and collapsed on the floor and hit the backside of his head. Recovered consciousness by his own and seeing breakfast still cooking on the stove top. Unsure about duration of unconsciousness. Able to call EMS himself.   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  Noncritical stenosis of LAD and ramus, not stented.  CT head and neck negative for fracture or dislocation. Hypotensive requiring pressors, concern w/ hematoma forearm at cath site as well as abdomen, CT showed distraction injury T10, abdominal wall hematoma no concerns for internal hemorrhage. Neurosurgery consult and additional imaging.  11/15: T7-12 posterior fusion, Plt transfusion. Echo - EF 55-60%, no RWMA, G1 diast df ,mild elevated pulm artery pressure, no significant valvular disease.  Stable following surgery  11/16: hypotensive, back on pressors  11/17: off pressors. AKI worsening, nephrology consulted.   Consultants:  Cardiology  Critical Care General Surgery  Neurosurgery  Nephrology   Procedures/Surgeries: 11/14: cardiac cath w/ DES to the LCX  11/15: arterial line insertion 11/15: Open Reduction and internal fixation of T10 fracture, Posterolateral arthrodesis T7 to T12      ASSESSMENT & PLAN:   STEMI S/p successful LCx stenting 11/14 Echocardiogram no serious abnormality - EF 55-60%, no RWMA, G1 diast df ,mild  elevated pulm artery pressure, no significant valvular disease. Cardiology following ASA, plavix, statin Holding beta blocker and ACE/ARB d/t hypotension in setting of likely spinal shock +/- opiate effect see below    Hypotension Resolved.  - d/c fluids, continue to hold home antihypertensives   AKI on CKD stage IIIa Complicated by hx lupus nephritis and hypotension, recent contrast, hypotension Resolved, kidney function has returned to baseline today D/c fluids and monitor  Unstable thoracic spine fracture T10 s/p T7-12 posterior fusion 11/15  Neurosurgery following. TLSO brace Pain control - limiting opiates d/t hypotension  For SNF, TOC working on that, no beds yet  Thrombocytopenia  Bleeding/hematoma, high bleed risk  Forearm Hematoma noted 11/14 post-cath at TR band site Appears stable, hgb stable   LLQ Abdominal wall hematoma  stable  Urine retention Catheterized 2 days ago but I see no report of why placed, that he failed CIC, etc. We are bladder scanning and CICing today, has required cath twice, will place foley next check if still retaining  Syncope suspect MI associated ventricle arrhythmia vs vasovagal   Scalp laceration Gen surg evaluated, hemostatic and well-approximated, no indication for suture/stabple  Hx of PE/DVT and intracranial venous thrombosis On Eliquis prior to hospitalization. Cardiology discussed w/ neurosurg today, safe to resume - apixaban resumed at 2.5 bid   HTN Hold home antihypertensives for now   SLE Stable holding CellCept Continue steroid 10 mg daily  Constipation Reports no bm last 4 days - continue current regimen, add lactulose   DVT prophylaxis: apixaban Nutrition: regular diet   Code Status: FULL CODE ACP documentation reviewed:  none on file in [facility]  TOC needs: snf placement, bed search underway  Family Communication: daughter tiffany updated telephonically 11/19           Subjective / Brief ROS:   Reports feeling fatigued, no pain, eating some diet        Objective Findings:  Vitals:   11/09/23 2335 11/10/23 0429 11/10/23 0814 11/10/23 1208  BP: (!) 143/79 (!) 146/76 (!) 159/96 (!) 155/71  Pulse: 80 82 79 74  Resp: 18 18 16 16   Temp: 98.5 F (36.9 C) 97.8 F (36.6 C) 97.9 F (36.6 C) 97.6 F (36.4 C)  TempSrc:   Oral Oral  SpO2: 91% 95% 92% 94%  Weight:      Height:        Intake/Output Summary (Last 24 hours) at 11/10/2023 1512 Last data filed at 11/10/2023 1017 Gross per 24 hour  Intake 72.41 ml  Output 2100 ml  Net -2027.59 ml   Filed Weights   11/05/23 1016 11/05/23 1301  Weight: 107.9 kg 102.8 kg    Examination:  NAD RRR CTAB Abdomen soft, bruise/hematoma lllq/flank Bruise left forearm No edema       Scheduled Medications:   acetaminophen  1,000 mg Oral Q6H   apixaban  2.5 mg Oral BID   ARIPiprazole  2 mg Oral Daily   aspirin  81 mg Oral Daily   Chlorhexidine Gluconate Cloth  6 each Topical Daily   clopidogrel  75 mg Oral Daily   docusate sodium  100 mg Oral BID   gabapentin  200 mg Oral TID   lidocaine  2 patch Transdermal Q24H   loratadine  5 mg Oral QPM   montelukast  10 mg Oral QHS   polyethylene glycol  34 g Oral Daily   rosuvastatin  20 mg Oral Daily   senna  1 tablet Oral BID   sertraline  100 mg Oral Daily   sodium chloride flush  3 mL Intravenous Q12H   tamsulosin  0.4 mg Oral QPC supper    Continuous Infusions:    PRN Medications:  albuterol, LORazepam, magnesium citrate, methocarbamol **OR** methocarbamol (ROBAXIN) injection, ondansetron (ZOFRAN) IV, mouth rinse, oxyCODONE, oxyCODONE, sodium chloride flush, sorbitol  Antimicrobials from admission:  Anti-infectives (From admission, onward)    None           Data Reviewed:  I have personally reviewed the following...  CBC: Recent Labs  Lab 11/05/23 1015 11/05/23 1757 11/06/23 0053 11/07/23 0352 11/07/23 1637 11/08/23 0403 11/09/23 0410  11/10/23 0636  WBC 8.0 8.8   < > 8.6 8.7 7.6 5.6 4.6  NEUTROABS 6.8 7.2  --   --   --   --   --   --   HGB 16.8 14.0   < > 11.2* 11.4* 10.5* 10.0* 10.2*  HCT 52.9* 42.9   < > 34.6* 35.8* 33.8* 31.5* 31.7*  MCV 93.1 90.5   < > 90.8 94.7 94.9 93.5 91.9  PLT 149* 111*   < > 118* 112* 102* 101* 122*   < > = values in this interval not displayed.   Basic Metabolic Panel: Recent Labs  Lab 11/05/23 1015 11/05/23 1840 11/06/23 0425 11/07/23 0352 11/07/23 1637 11/08/23 0403 11/09/23 0410 11/10/23 0636  NA 140   < > 136 136 136 135 135 138  K 3.9   < > 4.4 5.0 5.2* 4.6 4.0 3.9  CL 105   < > 105 107 107 106 107 109  CO2 25   < > 22 20* 21* 22  23 21*  GLUCOSE 119*   < > 110* 147* 126* 108* 110* 97  BUN 24*   < > 28* 31* 36* 45* 47* 36*  CREATININE 1.64*   < > 1.78* 2.02* 2.89* 3.31* 2.25* 1.36*  CALCIUM 9.2   < > 8.0* 7.7* 7.7* 7.7* 7.8* 8.2*  MG 2.1  --  2.0  --   --   --   --   --   PHOS 2.6  --  3.8  --   --   --   --   --    < > = values in this interval not displayed.   GFR: Estimated Creatinine Clearance: 72.9 mL/min (A) (by C-G formula based on SCr of 1.36 mg/dL (H)). Liver Function Tests: Recent Labs  Lab 11/05/23 1015 11/05/23 1840  AST 21 17  ALT 19 17  ALKPHOS 73 57  BILITOT 1.2* 1.2*  PROT 7.9 5.9*  ALBUMIN 4.4 3.4*   Recent Labs  Lab 11/05/23 1840  LIPASE 34   No results for input(s): "AMMONIA" in the last 168 hours. Coagulation Profile: Recent Labs  Lab 11/05/23 1015  INR 1.2   Cardiac Enzymes: No results for input(s): "CKTOTAL", "CKMB", "CKMBINDEX", "TROPONINI" in the last 168 hours. BNP (last 3 results) No results for input(s): "PROBNP" in the last 8760 hours. HbA1C: No results for input(s): "HGBA1C" in the last 72 hours. CBG: Recent Labs  Lab 11/05/23 1256  GLUCAP 93   Lipid Profile: No results for input(s): "CHOL", "HDL", "LDLCALC", "TRIG", "CHOLHDL", "LDLDIRECT" in the last 72 hours.  Thyroid Function Tests: No results for input(s):  "TSH", "T4TOTAL", "FREET4", "T3FREE", "THYROIDAB" in the last 72 hours. Anemia Panel: No results for input(s): "VITAMINB12", "FOLATE", "FERRITIN", "TIBC", "IRON", "RETICCTPCT" in the last 72 hours. Most Recent Urinalysis On File:     Component Value Date/Time   COLORURINE YELLOW (A) 11/08/2023 1204   APPEARANCEUR HAZY (A) 11/08/2023 1204   APPEARANCEUR Clear 03/25/2023 0833   LABSPEC 1.017 11/08/2023 1204   LABSPEC 1.013 04/10/2015 1036   PHURINE 5.0 11/08/2023 1204   GLUCOSEU NEGATIVE 11/08/2023 1204   GLUCOSEU Negative 04/10/2015 1036   HGBUR LARGE (A) 11/08/2023 1204   BILIRUBINUR NEGATIVE 11/08/2023 1204   BILIRUBINUR Negative 03/25/2023 0833   BILIRUBINUR Negative 04/10/2015 1036   KETONESUR NEGATIVE 11/08/2023 1204   PROTEINUR NEGATIVE 11/08/2023 1204   UROBILINOGEN 0.2 07/23/2021 1423   UROBILINOGEN 0.2 03/17/2015 0025   NITRITE NEGATIVE 11/08/2023 1204   LEUKOCYTESUR SMALL (A) 11/08/2023 1204   LEUKOCYTESUR Negative 04/10/2015 1036   Sepsis Labs: @LABRCNTIP (procalcitonin:4,lacticidven:4) Microbiology: Recent Results (from the past 240 hour(s))  MRSA Next Gen by PCR, Nasal     Status: None   Collection Time: 11/05/23  1:12 PM   Specimen: Nasal Mucosa; Nasal Swab  Result Value Ref Range Status   MRSA by PCR Next Gen NOT DETECTED NOT DETECTED Final    Comment: (NOTE) The GeneXpert MRSA Assay (FDA approved for NASAL specimens only), is one component of a comprehensive MRSA colonization surveillance program. It is not intended to diagnose MRSA infection nor to guide or monitor treatment for MRSA infections. Test performance is not FDA approved in patients less than 85 years old. Performed at [facility], [address] [place], [name] [address]       Radiology Studies last 3 days: ECHOCARDIOGRAM COMPLETE  Result Date: 11/06/2023    ECHOCARDIOGRAM REPORT   Patient Name:   Jonathon Snow Date of Exam: 11/06/2023 Medical Rec #:  [id number]  Height:        74.0 in Accession #:    [id number]     Weight:       226.6 lb Date of Birth:  1961-08-30     BSA:          2.292 m Patient Age:    61 years       BP:           98/58 mmHg Patient Gender: M              HR:           83 bpm. Exam Location:  [facility] Procedure: 2D Echo, Cardiac Doppler and Color Doppler Indications:     Syncope  History:         Patient has prior history of Echocardiogram examinations, most                  recent 09/10/2021. CAD and Acute MI, COPD,                  Signs/Symptoms:Syncope and Dyspnea; Risk Factors:Hypertension,                  Diabetes and Dyslipidemia. Pulmonary embolus.  Sonographer:     Mikki Harbor Referring Phys:  [phone] CHRISTOPHER END Diagnosing Phys: Debbe Odea MD  Sonographer Comments: Technically difficult study due to poor echo windows, suboptimal subcostal window and suboptimal parasternal window. Image acquisition challenging due to respiratory motion. IMPRESSIONS  1. Left ventricular ejection fraction, by estimation, is 55 to 60%. The left ventricle has normal function. The left ventricle has no regional wall motion abnormalities. There is mild left ventricular hypertrophy. Left ventricular diastolic parameters are consistent with Grade I diastolic dysfunction (impaired relaxation).  2. Right ventricular systolic function is normal. The right ventricular size is mildly enlarged. There is mildly elevated pulmonary artery systolic pressure.  3. The mitral valve is normal in structure. No evidence of mitral valve regurgitation.  4. The aortic valve is grossly normal. Aortic valve regurgitation is not visualized. FINDINGS  Left Ventricle: Left ventricular ejection fraction, by estimation, is 55 to 60%. The left ventricle has normal function. The left ventricle has no regional wall motion abnormalities. The left ventricular internal cavity size was normal in size. There is  mild left ventricular hypertrophy. Left ventricular diastolic parameters are consistent with  Grade I diastolic dysfunction (impaired relaxation). Right Ventricle: The right ventricular size is mildly enlarged. No increase in right ventricular wall thickness. Right ventricular systolic function is normal. There is mildly elevated pulmonary artery systolic pressure. The tricuspid regurgitant velocity is 2.66 m/s, and with an assumed right atrial pressure of 8 mmHg, the estimated right ventricular systolic pressure is 36.3 mmHg. Left Atrium: Left atrial size was normal in size. Right Atrium: Right atrial size was normal in size. Pericardium: There is no evidence of pericardial effusion. Mitral Valve: The mitral valve is normal in structure. No evidence of mitral valve regurgitation. MV peak gradient, 5.7 mmHg. The mean mitral valve gradient is 2.0 mmHg. Tricuspid Valve: The tricuspid valve is normal in structure. Tricuspid valve regurgitation is trivial. Aortic Valve: The aortic valve is grossly normal. Aortic valve regurgitation is not visualized. Aortic valve mean gradient measures 8.0 mmHg. Aortic valve peak gradient measures 11.0 mmHg. Aortic valve area, by VTI measures 1.97 cm. Pulmonic Valve: The pulmonic valve was not well visualized. Pulmonic valve regurgitation is not visualized. Aorta: The aortic root is normal in size and structure. Venous: The inferior  vena cava was not well visualized. IAS/Shunts: No atrial level shunt detected by color flow Doppler.  LEFT VENTRICLE PLAX 2D LVIDd:         4.10 cm   Diastology LVIDs:         3.00 cm   LV e' medial:    9.20 cm/s LV PW:         1.30 cm   LV E/e' medial:  9.8 LV IVS:        1.30 cm   LV e' lateral:   7.94 cm/s LVOT diam:     2.00 cm   LV E/e' lateral: 11.3 LV SV:         69 LV SV Index:   30 LVOT Area:     3.14 cm  RIGHT VENTRICLE RV Basal diam:  3.85 cm RV Mid diam:    3.70 cm RV S prime:     21.80 cm/s TAPSE (M-mode): 2.3 cm LEFT ATRIUM             Index        RIGHT ATRIUM           Index LA diam:        3.30 cm 1.44 cm/m   RA Area:     16.60  cm LA Vol (A2C):   47.8 ml 20.85 ml/m  RA Volume:   49.50 ml  21.60 ml/m LA Vol (A4C):   87.8 ml 38.31 ml/m LA Biplane Vol: 66.6 ml 29.06 ml/m  AORTIC VALVE                     PULMONIC VALVE AV Area (Vmax):    1.99 cm      PV Vmax:       0.96 m/s AV Area (Vmean):   1.64 cm      PV Peak grad:  3.7 mmHg AV Area (VTI):     1.97 cm AV Vmax:           166.00 cm/s AV Vmean:          131.000 cm/s AV VTI:            0.350 m AV Peak Grad:      11.0 mmHg AV Mean Grad:      8.0 mmHg LVOT Vmax:         105.00 cm/s LVOT Vmean:        68.500 cm/s LVOT VTI:          0.219 m LVOT/AV VTI ratio: 0.63  AORTA Ao Root diam: 3.40 cm MITRAL VALVE                TRICUSPID VALVE MV Area (PHT): 4.65 cm     TR Peak grad:   28.3 mmHg MV Area VTI:   2.85 cm     TR Vmax:        266.00 cm/s MV Peak grad:  5.7 mmHg MV Mean grad:  2.0 mmHg     SHUNTS MV Vmax:       1.19 m/s     Systemic VTI:  0.22 m MV Vmean:      67.3 cm/s    Systemic Diam: 2.00 cm MV Decel Time: 163 msec MV E velocity: 90.10 cm/s MV A velocity: 130.00 cm/s MV E/A ratio:  0.69 Debbe Odea MD Electronically signed by Debbe Odea MD Signature Date/Time: 11/06/2023/5:40:51 PM    Final    DG Thoracic Spine 2 View  Result Date: 11/06/2023  CLINICAL DATA:  Elective surgery. EXAM: THORACIC SPINE 2 VIEWS COMPARISON:  Thoracic spine MRI yesterday FINDINGS: Two fluoroscopic spot views of the thoracic spine as well as intraoperative CT imaging obtained during multilevel thoracic spine fusion. Fluoroscopy time 1 minute 6 seconds. Dose 85.785 mGy. IMPRESSION: Intraoperative fluoroscopy during thoracic spine fusion. Electronically Signed   By: Narda Rutherford M.D.   On: 11/06/2023 16:30   DG C-Arm 1-60 Min-No Report  Result Date: 11/06/2023 Fluoroscopy was utilized by the requesting physician.  No radiographic interpretation.         Silvano Bilis, MD [facility] 11/10/2023, 3:12 PM    If 7PM-7AM, please contact night  coverage [email or web address]

## 2023-11-10 NOTE — Progress Notes (Signed)
   Neurosurgery Progress Note  History: Jonathon Snow is s/p T7-T12 percutaneous PSF for T10 fracture  POD4: Continued back pain. POD3: continued back pain this morning. Pt denies any radiating leg pain or associated numbness or tingling  Physical Exam: Vitals:   11/10/23 0429 11/10/23 0814  BP: (!) 146/76 (!) 159/96  Pulse: 82 79  Resp: 18 16  Temp: 97.8 F (36.6 C) 97.9 F (36.6 C)  SpO2: 95% 92%    AA Ox3 CNI  Strength:5/5 throughout BLE Sensation intact   Assessment/Plan:  Jonathon Snow is a 62 y.o presenting after a fall resulting in T10 fracture s/p T7-T12 PSF.   - mobilize - pain control - DVT prophylaxis - PTOT. TLSO brace at bedside  - OK to leave incision open to air - eliquis to restart this week per cardiology - on ASA/plavix due to CAD risk  Venetia Night MD Department of Neurosurgery

## 2023-11-10 NOTE — Progress Notes (Signed)
Occupational Therapy Treatment Patient Details Name: Jonathon Snow MRN: [id number] DOB: 07/08/1961 Today's Date: 11/10/2023   History of present illness Pt is a 62 yo male that presented with sudden lightheadness and syncope, admitted with new inferior ST segment elevation, underwent emergent cardiac cath and PCI, also s/p  open reduction and internal fixation of T10 fracture, posterior lateral arthrodesis T7-T12. PMH of COPD, HTN, CAD, past MI, PVD, DVT, cerbral venous sinus thrombosis,  lupus, lymphedema, PE, renal disorder stage III, back surgery   OT comments  Pt seen for OT treatment on this date. Upon arrival to room pt supine in bed with son at bedside, agreeable to OT/PT co-tx. Pt completed all bed mobility with MOD Ax2. Pt completed STS at the bedside with MOD A x2 +R platform walker. On second attempt pt took 4 side steps towards the [facility] and attempted to use the urinal in standing with Mod A. Pt unable to void in at this time, but encouraged to do so in standing during the day. Pt completed supine<>sit t/f with Mod Ax2. Pt completed oral care in sitting with supervision and changed into clean gown. Pt left supine in bed with all needs met and RN in the room. Pt making good progress toward goals, will continue to follow POC. Discharge recommendation remains appropriate.        If plan is discharge home, recommend the following:  Two people to help with walking and/or transfers;Two people to help with bathing/dressing/bathroom;Assistance with cooking/housework;Help with stairs or ramp for entrance;Assist for transportation   Equipment Recommendations  Other (comment) (defer to next venue of care)    Recommendations for Other Services      Precautions / Restrictions Precautions Precautions: Back;Fall;Other (comment) Restrictions Weight Bearing Restrictions: Yes RUE Weight Bearing: Non weight bearing LUE Weight Bearing: Weight bearing as tolerated Other Position/Activity  Restrictions: LUE A line NWBing through wrist while A line is in - clarify with RN restrictions once removed; s/p R radial - both essentially NWBing through wrists ~1 wk       Mobility Bed Mobility Overal bed mobility: Needs Assistance Bed Mobility: Rolling, Supine to Sit, Sit to Supine Rolling: +2 for safety/equipment, Mod assist   Supine to sit: +2 for physical assistance, Mod assist Sit to supine: +2 for physical assistance, Mod assist     Patient Response: Cooperative  Transfers Overall transfer level: Needs assistance Equipment used: Right platform walker Transfers: Sit to/from Stand Sit to Stand: +2 safety/equipment, From elevated surface, Mod assist                 Balance Overall balance assessment: Needs assistance Sitting-balance support: Feet supported, No upper extremity supported Sitting balance-Leahy Scale: Fair     Standing balance support: Bilateral upper extremity supported, During functional activity, Reliant on assistive device for balance Standing balance-Leahy Scale: Fair                             ADL either performed or assessed with clinical judgement   ADL Overall ADL's : Needs assistance/impaired                                     Functional mobility during ADLs: Supervision/safety;Moderate assistance;+2 for physical assistance;Rolling walker (2 wheels) General ADL Comments: Pt completed STS at the bedside with MOD A x2 +R platform walker. On second attempt pt took  4 side steps towards the [facility] and attempted to use the urinal in standing with Mod A. Pt unable to void in at this time, but encouraged to do so in standing during the day. Pt completed supine<>sit t/f with Mod Ax2. Pt completed oral care in sitting with supervision and set up. Pt changed into clean gown.    Extremity/Trunk Assessment Upper Extremity Assessment Upper Extremity Assessment: Generalized weakness   Lower Extremity Assessment Lower  Extremity Assessment: Generalized weakness   Cervical / Trunk Assessment Cervical / Trunk Assessment: Back Surgery    Vision       Perception     Praxis      Cognition Arousal: Alert Behavior During Therapy: WFL for tasks assessed/performed Overall Cognitive Status: Within Functional Limits for tasks assessed                                 General Comments: alert and pleasant. Agreeable to session        Exercises      Shoulder Instructions       General Comments      Pertinent Vitals/ Pain       Pain Assessment Pain Assessment: Faces Faces Pain Scale: Hurts little more Pain Location: midline back Pain Descriptors / Indicators: Aching, Moaning, Sharp, Guarding, Grimacing Pain Intervention(s): Limited activity within patient's tolerance, Monitored during session, Repositioned  Home Living                                          Prior Functioning/Environment              Frequency  Min 1X/week        Progress Toward Goals  OT Goals(current goals can now be found in the care plan section)  Progress towards OT goals: Progressing toward goals     Plan      Co-evaluation    PT/OT/SLP Co-Evaluation/Treatment: Yes Reason for Co-Treatment: For patient/therapist safety;To address functional/ADL transfers   OT goals addressed during session: ADL's and self-care      AM-PAC OT "6 Clicks" Daily Activity     Outcome Measure   Help from another person eating meals?: None Help from another person taking care of personal grooming?: A Little Help from another person toileting, which includes using toliet, bedpan, or urinal?: Total Help from another person bathing (including washing, rinsing, drying)?: A Lot Help from another person to put on and taking off regular upper body clothing?: A Lot Help from another person to put on and taking off regular lower body clothing?: A Lot 6 Click Score: 14    End of Session  Equipment Utilized During Treatment: Back brace;Rolling walker (2 wheels)  OT Visit Diagnosis: Other abnormalities of gait and mobility (R26.89);Pain;History of falling (Z91.81)   Activity Tolerance Patient tolerated treatment well   Patient Left in bed;with call bell/phone within reach;with bed alarm set   Nurse Communication Mobility status        Time: 1610-9604 OT Time Calculation (min): 33 min  Charges:   Butch Penny, SOT

## 2023-11-10 NOTE — Progress Notes (Signed)
[facility] [place], [name] 11/10/23  Subjective:   Hospital day # 5  Patient known to our practice from office follow-up for lupus nephritis.  Last seen in October 2024.  He presented via EMS from home with code STEMI.  Per notes, he was cooking outside and felt dizzy and had a syncopal episode.  He sustained a laceration to the posterior part of his head. He underwent emergent coronary angiography and was found to have multivessel coronary artery disease.  He underwent PCI to left circumflex.  CT scan of the abdomen and pelvis showed thoracic spine fracture, left lower anterior abdominal wall subcutaneous hematoma.  He then underwent CT angiography of the chest which confirmed T10 vertebral body fracture with associated mild prevertebral stranding and hemorrhage.    He was evaluated by neurosurgery..  Because of various thoracic spine injuries, he is deemed to be extremely high risk for spinal cord injury.  T7-T12 fixation of the fracture and fusion of the thoracic spine was recommended.Anticoagulation with apixaban and aspirin and clopidogrel is on hold.  Patient underwent successful open reduction internal fixation of T10 fracture, posterior lateral arthrodesis T7-T12 on 11/06/2023.  Unfortunately patient has developed acute kidney injury.  His creatinine has increased from Baseline 1.35/GFR 60 on 09/29/2023   Update: Renal function continues to improve. BUN down to 36 with a creatinine 1.36. Reports that his appetite is improving.   Objective:  Vital signs in last 24 hours:  Temp:  [97.6 F (36.4 C)-98.5 F (36.9 C)] 98.1 F (36.7 C) (11/19 2025) Pulse Rate:  [74-90] 90 (11/19 2025) Resp:  [16-18] 18 (11/19 2025) BP: (130-173)/(71-96) 173/80 (11/19 2025) SpO2:  [91 %-98 %] 98 % (11/19 2025)  Weight change:  Filed Weights   11/05/23 1016 11/05/23 1301  Weight: 107.9 kg 102.8 kg    Intake/Output:    Intake/Output Summary (Last 24 hours) at  11/10/2023 2040 Last data filed at 11/10/2023 1017 Gross per 24 hour  Intake 72.41 ml  Output 1100 ml  Net -1027.59 ml     Physical Exam: General: No acute distress, laying in the bed  HEENT Moist oral mucous membranes  Pulm/lungs Normal breathing effort  CVS/Heart S1S2 no rubs  Abdomen:  Soft, nontender  Extremities: No edema  Neurologic: Alert, oriented  Skin: Left groin hematoma, right forearm hematoma  Foley In place       Basic Metabolic Panel:  Recent Labs  Lab 11/05/23 1015 11/05/23 1840 11/06/23 0425 11/07/23 0352 11/07/23 1637 11/08/23 0403 11/09/23 0410 11/10/23 0636  NA 140   < > 136 136 136 135 135 138  K 3.9   < > 4.4 5.0 5.2* 4.6 4.0 3.9  CL 105   < > 105 107 107 106 107 109  CO2 25   < > 22 20* 21* 22 23 21*  GLUCOSE 119*   < > 110* 147* 126* 108* 110* 97  BUN 24*   < > 28* 31* 36* 45* 47* 36*  CREATININE 1.64*   < > 1.78* 2.02* 2.89* 3.31* 2.25* 1.36*  CALCIUM 9.2   < > 8.0* 7.7* 7.7* 7.7* 7.8* 8.2*  MG 2.1  --  2.0  --   --   --   --   --   PHOS 2.6  --  3.8  --   --   --   --   --    < > = values in this interval not displayed.     CBC: Recent Labs  Lab 11/05/23 1015 11/05/23 1757 11/06/23 0053 11/07/23 0352 11/07/23 1637 11/08/23 0403 11/09/23 0410 11/10/23 0636  WBC 8.0 8.8   < > 8.6 8.7 7.6 5.6 4.6  NEUTROABS 6.8 7.2  --   --   --   --   --   --   HGB 16.8 14.0   < > 11.2* 11.4* 10.5* 10.0* 10.2*  HCT 52.9* 42.9   < > 34.6* 35.8* 33.8* 31.5* 31.7*  MCV 93.1 90.5   < > 90.8 94.7 94.9 93.5 91.9  PLT 149* 111*   < > 118* 112* 102* 101* 122*   < > = values in this interval not displayed.     No results found for: "HEPBSAG", "HEPBSAB", "HEPBIGM"    Microbiology:  Recent Results (from the past 240 hour(s))  MRSA Next Gen by PCR, Nasal     Status: None   Collection Time: 11/05/23  1:12 PM   Specimen: Nasal Mucosa; Nasal Swab  Result Value Ref Range Status   MRSA by PCR Next Gen NOT DETECTED NOT DETECTED Final    Comment:  (NOTE) The GeneXpert MRSA Assay (FDA approved for NASAL specimens only), is one component of a comprehensive MRSA colonization surveillance program. It is not intended to diagnose MRSA infection nor to guide or monitor treatment for MRSA infections. Test performance is not FDA approved in patients less than 74 years old. Performed at [facility], [address] [place], [name] [address]     Coagulation Studies: No results for input(s): "LABPROT", "INR" in the last 72 hours.   Urinalysis: Recent Labs    11/08/23 1204  COLORURINE YELLOW*  LABSPEC 1.017  PHURINE 5.0  GLUCOSEU NEGATIVE  HGBUR LARGE*  BILIRUBINUR NEGATIVE  KETONESUR NEGATIVE  PROTEINUR NEGATIVE  NITRITE NEGATIVE  LEUKOCYTESUR SMALL*      Imaging: No results found.   Medications:      acetaminophen  1,000 mg Oral Q6H   apixaban  2.5 mg Oral BID   ARIPiprazole  2 mg Oral Daily   aspirin  81 mg Oral Daily   Chlorhexidine Gluconate Cloth  6 each Topical Daily   clopidogrel  75 mg Oral Daily   docusate sodium  100 mg Oral BID   gabapentin  200 mg Oral TID   lidocaine  2 patch Transdermal Q24H   loratadine  5 mg Oral QPM   montelukast  10 mg Oral QHS   polyethylene glycol  34 g Oral Daily   rosuvastatin  20 mg Oral Daily   senna  1 tablet Oral BID   sertraline  100 mg Oral Daily   sodium chloride flush  3 mL Intravenous Q12H   tamsulosin  0.4 mg Oral QPC supper   albuterol, LORazepam, magnesium citrate, methocarbamol **OR** methocarbamol (ROBAXIN) injection, ondansetron (ZOFRAN) IV, mouth rinse, oxyCODONE, oxyCODONE, sodium chloride flush, sorbitol  Assessment/ Plan:  62 y.o. male with lupus nephritis, hypertension, mild CKD, lymphedema, secondary hyperparathyroidism, history of lumbar herpes zoster infection, history of pulmonary embolus with IVC filter placement, right ankle fracture status post ORIF, herniated disc status post L5-S1 fusion history of right axillary and left groin  abscess in May 2018    admitted on 11/05/2023 for STEMI (ST elevation myocardial infarction) [facility]) [I21.3]  Hospital course is complicated by Scalp laceration, left circumflex PCI with stent placement, thoracic spine fracture status post T7-T12 fixation and thoracic spine fusion by neurosurgery this admission, acute kidney injury  1.  Acute kidney injury on chronic kidney disease stage  3a with underlying lupus nephritis, hypotension.  Lupus nephritis has been managed with CellCept (1000 mg twice a day), hydroxychloroquine 200 mg p.o. daily, losartan 100 mg p.o. daily, prednisone 10 mg daily as outpatient. Most recent outpatient labs-09/29/2023-creatinine 1.36, GFR 59 AKI likely secondary to hypotension and multiple IV contrast exposures. Update: Renal function continues to improve.  Creatinine down to 1.36.  Continue to hold CellCept at this time.  Will likely resume as an outpatient to prevent his lupus from reflaring.  2.  Anemia of chronic kidney disease.  Hemoglobin currently 10.2.  No immediate need for Epogen.  Monitor CBC.    LOS: 5 Edi Gorniak 11/19/20248:40 PM  [facility] [place], [name] [phone]  Note: This note was prepared with Dragon dictation. Any transcription errors are unintentional

## 2023-11-10 NOTE — Progress Notes (Addendum)
Patient Name: Jonathon Snow Date of Encounter: 11/10/2023 [facility] Cardiologist: Yvonne Kendall, MD   Interval Summary  .    Complains of continued back pain at surgical site, though this is getting better.  No chest pain or shortness of breath.  He endorses some orthostatic lightheadedness when sitting up with physical therapy.  Vital Signs .    Vitals:   11/10/23 0429 11/10/23 0814 11/10/23 1208 11/10/23 1603  BP: (!) 146/76 (!) 159/96 (!) 155/71 130/80  Pulse: 82 79 74 76  Resp: 18 16 16 16   Temp: 97.8 F (36.6 C) 97.9 F (36.6 C) 97.6 F (36.4 C) 98 F (36.7 C)  TempSrc:  Oral Oral   SpO2: 95% 92% 94% 93%  Weight:      Height:        Intake/Output Summary (Last 24 hours) at 11/10/2023 1810 Last data filed at 11/10/2023 1017 Gross per 24 hour  Intake 72.41 ml  Output 1100 ml  Net -1027.59 ml      11/05/2023    1:01 PM 11/05/2023   10:16 AM 10/28/2023    1:32 PM  Last 3 Weights  Weight (lbs) 226 lb 10.1 oz 237 lb 14 oz 230 lb 8 oz  Weight (kg) 102.8 kg 107.9 kg 104.554 kg      Telemetry/ECG    Normal sinus rhythm - Personally Reviewed  Physical Exam .   GEN: No acute distress.   Neck: No JVD Cardiac: RRR, no murmurs, rubs, or gallops.  Respiratory: Clear anteriorly.  Posterior auscultation not performed. GI: Soft, nontender, non-distended  MS: No edema  Assessment & Plan .     Syncope and unstable angina: Patient presented with syncopal episode of unclear etiology, found to have 1 mm inferior ST elevation when EMS arrived.  He was taken for emergent cardiac catheterization that revealed multivessel CAD including CTO of the RCA and hazy 80-90% stenosis of distal LCx.  The latter lesion was treated with PCI, though subsequent troponins never rose.  Clopidogrel resumed yesterday following clearance by neurosurgery. -Continue clopidogrel 75 mg daily for 12 months. -Hold aspirin going forward with the addition of apixaban 2.5 mg twice daily  (see details below). -Continue rosuvastatin 20 mg daily. -Consider adding beta-blocker tomorrow if blood pressure allows and orthostatic lightheadedness continues to improve. -Defer further management of chronic CAD involving LAD, ramus intermedius, and RCA to the outpatient setting.  If the patient were to develop angina, functional study to assess hemodynamic significance of LAD and ramus intermedius disease will need to be considered. -Recommend 14-day event monitor (Zio AT) at discharge.  T10 fracture: Back pain gradually improving.  Patient participating with PT. -Continue PT and post-op care per neurosurgery.  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  Patient's presentation and hospital course were discussed with Dr. Marcell Barlow, Dr. Donneta Romberg, and Dr. Ashok Pall.  It was felt that his risk of recurrent VTE is high and then anticoagulation should be resumed. -Begin apixaban 2.5 mg twice daily. -Hold aspirin. -Continue close monitoring for bleeding from surgical incisions.  If bleeding develops or hemoglobin drops significantly, apixaban will need to be held. -Further management of lupus per IM.  Acute kidney injury superimposed on chronic kidney disease: Renal function improved and back to baseline. -Maintain net even fluid balance. -Avoid nephrotoxic drugs.  Hypotension - resolving: Hypotension has resolved though  Jonathon Snow continues to note some orthostatic lightheadedness. -Obtain orthostatic vital signs when able to do so, ideally with physical therapy. -Consider adding low-dose beta-blocker as blood pressure allows.  For questions or updates, please contact  [facility] Please consult [email or web address] for contact info under [facility].     Signed, Yvonne Kendall, MD

## 2023-11-10 NOTE — Progress Notes (Signed)
Physical Therapy Treatment Patient Details Name: Jonathon Snow MRN: [id number] DOB: 05/18/1961 Today's Date: 11/10/2023   History of Present Illness Pt is a 62 yo male that presented with sudden lightheadness and syncope, admitted with new inferior ST segment elevation, underwent emergent cardiac cath and PCI, also s/p  open reduction and internal fixation of T10 fracture, posterior lateral arthrodesis T7-T12. PMH of COPD, HTN, CAD, past MI, PVD, DVT, cerbral venous sinus thrombosis,  lupus, lymphedema, PE, renal disorder stage III, back surgery    PT Comments  Co-treat performed with OT. Pt reports feeling poorly throughout day. Ensure protein drink at bedside. Pt reports he is willing to perform combined session this date. Pt still complains of dizziness/nausea with position change, consider orthostatics next session. Able to perform bed mobility/transfers with decreased assist and R platform RW used. Encouraged pt to continue sitting/standing attempts with RN staff, communicated at bedside and explained expectation for sitting in recliner next date. Will continue to progress as able.   If plan is discharge home, recommend the following: Two people to help with walking and/or transfers;Two people to help with bathing/dressing/bathroom;Direct supervision/assist for medications management;Help with stairs or ramp for entrance;Assist for transportation;Assistance with feeding;Assistance with cooking/housework   Can travel by private vehicle     No  Equipment Recommendations  Other (comment)    Recommendations for Other Services       Precautions / Restrictions Precautions Precautions: Back;Fall;Other (comment) Precaution Comments: A line removed, s/p R radial cath Restrictions Weight Bearing Restrictions: Yes RUE Weight Bearing: Non weight bearing LUE Weight Bearing: Weight bearing as tolerated Other Position/Activity Restrictions: LUE A line NWBing through wrist while A line is in -  clarify with RN restrictions once removed; s/p R radial - both essentially NWBing through wrists ~1 wk     Mobility  Bed Mobility Overal bed mobility: Needs Assistance Bed Mobility: Rolling, Supine to Sit, Sit to Supine Rolling: +2 for safety/equipment, Mod assist   Supine to sit: +2 for physical assistance, Mod assist     General bed mobility comments: Needs heavy cues for sequencing including log rolling. Follows commands well and once seated at EOB, able to sit with upright posture. Dizziness noted with change in position    Transfers Overall transfer level: Needs assistance Equipment used: Right platform walker Transfers: Sit to/from Stand Sit to Stand: +2 safety/equipment, From elevated surface, Mod assist           General transfer comment: TLSO brace donned. 2 transfers performed with safe technique and decreased assist compared to previous date. Fatigues quickly, however able to stand ~1-2 mins    Ambulation/Gait Ambulation/Gait assistance: Min assist, +2 physical assistance Gait Distance (Feet): 3 Feet Assistive device: Right platform walker Gait Pattern/deviations: Step-to pattern       General Gait Details: small shuffle gait up towards HOB. Unable to tolerate further activity this date. Pre gait weight shifts performed   Stairs             Wheelchair Mobility     Tilt Bed    Modified Rankin (Stroke Patients Only)       Balance Overall balance assessment: Needs assistance Sitting-balance support: Feet supported, No upper extremity supported Sitting balance-Leahy Scale: Fair     Standing balance support: Bilateral upper extremity supported, During functional activity, Reliant on assistive device for balance Standing balance-Leahy Scale: Fair  Cognition Arousal: Alert Behavior During Therapy: WFL for tasks assessed/performed Overall Cognitive Status: Within Functional Limits for tasks assessed                                  General Comments: alert and pleasant. Agreeable to session        Exercises Other Exercises Other Exercises: seated ther-ex performed on B LE including LAQ and AP. 10 reps performed. Heel slides x 10 reps while supine in bed. Supervision given Other Exercises: while standing, attempted for urination, unable to urinate at this time.    General Comments        Pertinent Vitals/Pain Pain Assessment Pain Assessment: 0-10 Pain Score: 7  Pain Location: midline back Pain Descriptors / Indicators: Aching, Moaning, Sharp, Guarding, Grimacing Pain Intervention(s): Limited activity within patient's tolerance    Home Living                          Prior Function            PT Goals (current goals can now be found in the care plan section) Acute Rehab PT Goals Patient Stated Goal: to have less pain PT Goal Formulation: With patient Time For Goal Achievement: 11/22/23 Potential to Achieve Goals: Good Progress towards PT goals: Progressing toward goals    Frequency    7X/week      PT Plan      Co-evaluation PT/OT/SLP Co-Evaluation/Treatment: Yes Reason for Co-Treatment: For patient/therapist safety;To address functional/ADL transfers PT goals addressed during session: Mobility/safety with mobility;Balance OT goals addressed during session: ADL's and self-care      AM-PAC PT "6 Clicks" Mobility   Outcome Measure  Help needed turning from your back to your side while in a flat bed without using bedrails?: A Lot Help needed moving from lying on your back to sitting on the side of a flat bed without using bedrails?: A Lot Help needed moving to and from a bed to a chair (including a wheelchair)?: A Lot Help needed standing up from a chair using your arms (e.g., wheelchair or bedside chair)?: A Lot Help needed to walk in hospital room?: Total Help needed climbing 3-5 steps with a railing? : Total 6 Click Score: 10     End of Session Equipment Utilized During Treatment: Back brace Activity Tolerance: Patient limited by pain Patient left:  (left seated at EOB with RN and OT) Nurse Communication: Mobility status PT Visit Diagnosis: Difficulty in walking, not elsewhere classified (R26.2);Other abnormalities of gait and mobility (R26.89);Muscle weakness (generalized) (M62.81);Pain     Time: 1914-7829 PT Time Calculation (min) (ACUTE ONLY): 29 min  Charges:    $Therapeutic Exercise: 8-22 mins PT General Charges $$ ACUTE PT VISIT: 1 Visit                     Elizabeth Palau, PT, DPT, GCS [phone]    Arlyss Weathersby 11/10/2023, 4:09 PM

## 2023-11-11 ENCOUNTER — Encounter: Payer: Self-pay | Admitting: Neurosurgery

## 2023-11-11 DIAGNOSIS — R338 Other retention of urine: Secondary | ICD-10-CM

## 2023-11-11 HISTORY — DX: Other retention of urine: R33.8

## 2023-11-11 LAB — CBC
HCT: 31.9 % — ABNORMAL LOW (ref 39.0–52.0)
Hemoglobin: 10.4 g/dL — ABNORMAL LOW (ref 13.0–17.0)
MCH: 29.1 pg (ref 26.0–34.0)
MCHC: 32.6 g/dL (ref 30.0–36.0)
MCV: 89.1 fL (ref 80.0–100.0)
Platelets: 149 10*3/uL — ABNORMAL LOW (ref 150–400)
RBC: 3.58 MIL/uL — ABNORMAL LOW (ref 4.22–5.81)
RDW: 15.3 % (ref 11.5–15.5)
WBC: 5.8 10*3/uL (ref 4.0–10.5)
nRBC: 0 % (ref 0.0–0.2)

## 2023-11-11 LAB — BASIC METABOLIC PANEL
Anion gap: 7 (ref 5–15)
BUN: 30 mg/dL — ABNORMAL HIGH (ref 8–23)
CO2: 21 mmol/L — ABNORMAL LOW (ref 22–32)
Calcium: 8.7 mg/dL — ABNORMAL LOW (ref 8.9–10.3)
Chloride: 112 mmol/L — ABNORMAL HIGH (ref 98–111)
Creatinine, Ser: 1.19 mg/dL (ref 0.61–1.24)
GFR, Estimated: >60 mL/min (ref >60)
Glucose, Bld: 96 mg/dL (ref 70–99)
Potassium: 4 mmol/L (ref 3.5–5.1)
Sodium: 140 mmol/L (ref 135–145)

## 2023-11-11 MED ORDER — POLYETHYLENE GLYCOL 3350 17 G PO PACK
17.0000 g | PACK | Freq: Every day | ORAL | Status: DC
  Filled 2023-11-11: qty 1

## 2023-11-11 MED ORDER — FAMOTIDINE 20 MG PO TABS
20.0000 mg | ORAL_TABLET | Freq: Two times a day (BID) | ORAL | Status: DC | PRN
  Administered 2023-11-11: 20 mg via ORAL
  Filled 2023-11-11: qty 1

## 2023-11-11 MED ORDER — METOPROLOL SUCCINATE ER 25 MG PO TB24
12.5000 mg | ORAL_TABLET | Freq: Every day | ORAL | Status: DC
  Administered 2023-11-11 – 2023-11-13 (×3): 12.5 mg via ORAL
  Filled 2023-11-11 (×3): qty 1

## 2023-11-11 MED ORDER — AMLODIPINE BESYLATE 10 MG PO TABS
10.0000 mg | ORAL_TABLET | Freq: Every day | ORAL | Status: DC
  Administered 2023-11-11 – 2023-11-12 (×2): 10 mg via ORAL
  Filled 2023-11-11 (×3): qty 1

## 2023-11-11 NOTE — Progress Notes (Signed)
Occupational Therapy Treatment Patient Details Name: Jonathon Snow MRN: [id number] DOB: 1961-07-04 Today's Date: 11/11/2023   History of present illness Pt is a 62 yo male that presented with sudden lightheadness and syncope, admitted with new inferior ST segment elevation, underwent emergent cardiac cath and PCI, also s/p  open reduction and internal fixation of T10 fracture, posterior lateral arthrodesis T7-T12. PMH of COPD, HTN, CAD, past MI, PVD, DVT, cerbral venous sinus thrombosis,  lupus, lymphedema, PE, renal disorder stage III, back surgery   OT comments  Jonathon Snow was seen for OT/PT co-treatment on this date. Upon arrival to room pt supine in bed, throwing up into emesis bag, RN in to room to give nausea medication during sesison. Pt agreeable to Tx session. OT/PT facilitated ADL management with education and assist as described below. See ADL section for additional details regarding occupational performance. Pt continues to be functionally limited by increased pain with mobility, decreased activity tolerance, and generalized weakness. Pt return verbalizes understanding of education provided t/o session. Pt is progressing toward OT goals and continues to benefit from skilled OT services to maximize return to PLOF and minimize risk of future falls, injury, caregiver burden, and readmission. Will continue to follow POC as written. Discharge recommendation remains appropriate.        If plan is discharge home, recommend the following:  Two people to help with walking and/or transfers;Assistance with cooking/housework;Help with stairs or ramp for entrance;Assist for transportation;Two people to help with bathing/dressing/bathroom   Equipment Recommendations   (defer)    Recommendations for Other Services      Precautions / Restrictions Precautions Precautions: Back;Fall;Other (comment) Precaution Comments: A line removed, s/p R radial cath Restrictions Weight Bearing Restrictions:  Yes RUE Weight Bearing: Non weight bearing LUE Weight Bearing: Weight bearing as tolerated Other Position/Activity Restrictions: LUE A line NWBing through wrist while A line is in - clarify with RN restrictions once removed; s/p R radial - both essentially NWBing through wrists ~1 wk       Mobility Bed Mobility Overal bed mobility: Needs Assistance Bed Mobility: Rolling, Sidelying to Sit Rolling: Min assist Sidelying to sit: Min assist       General bed mobility comments: improved technique, still required cues for sequencing. Once seated, needs assist for donning TLSO. Still presents with dizziness with position change    Transfers Overall transfer level: Needs assistance Equipment used: Right platform walker Transfers: Sit to/from Stand Sit to Stand: +2 safety/equipment, From elevated surface, Mod assist           General transfer comment: Bed slightly elevated for ease of standing. First attempt with 1 assist and pt unable, needed +2 for safe technique in addition to cues. Once standing, dizziness still present.     Balance Overall balance assessment: Needs assistance Sitting-balance support: Feet supported, No upper extremity supported Sitting balance-Leahy Scale: Fair     Standing balance support: Bilateral upper extremity supported, During functional activity, Reliant on assistive device for balance Standing balance-Leahy Scale: Good                             ADL either performed or assessed with clinical judgement   ADL Overall ADL's : Needs assistance/impaired                 Upper Body Dressing : Sitting;Maximal assistance Upper Body Dressing Details (indicate cue type and reason): MAX A to don TLSO in sitting.  Toilet Transfer: +2 for safety/equipment;Moderate assistance;BSC/3in1 Toilet Transfer Details (indicate cue type and reason): MOD A for initial STS from EOB pt able to take slow effortful steps to turn to [facility] He is able to  STS from [facility] with MIN A and +2 to assist with stabalizing RW. Toileting- Clothing Manipulation and Hygiene: Sitting/lateral lean;Supervision/safety;Set up       Functional mobility during ADLs: Rolling walker (2 wheels);+2 for safety/equipment;Supervision/safety;Set up General ADL Comments: Pt able to perform transfers from bed>BSC>room recliner with supervision for safety once up in standing. Initial MOD A progressing to MIN A for subsequent STS t/fs during session. Cues to adhere to WB precautions provided t/o session.    Extremity/Trunk Assessment              Vision Patient Visual Report: No change from baseline     Perception     Praxis      Cognition Arousal: Alert Behavior During Therapy: WFL for tasks assessed/performed, Agitated Overall Cognitive Status: Within Functional Limits for tasks assessed                                 General Comments: Pt alert, mildly agitated during sesison. Provides limited 1 word answers to questions, but ultimately participates in therapy session.        Exercises Other Exercises Other Exercises: orthostatics taken, placed in vitals Other Exercises: OT/PT assisted with ADL management as described below. See ADL section for additional details.    Shoulder Instructions       General Comments      Pertinent Vitals/ Pain       Pain Assessment Pain Assessment: 0-10 Pain Score: 7  Pain Location: back pain Pain Descriptors / Indicators: Aching, Moaning, Sharp, Guarding, Grimacing Pain Intervention(s): Limited activity within patient's tolerance, Monitored during session, RN gave pain meds during session, Repositioned  Home Living                                          Prior Functioning/Environment              Frequency  Min 1X/week        Progress Toward Goals  OT Goals(current goals can now be found in the care plan section)  Progress towards OT goals: Progressing toward  goals  Acute Rehab OT Goals Patient Stated Goal: get better, have less pain OT Goal Formulation: With patient Time For Goal Achievement: 11/22/23 Potential to Achieve Goals: Good  Plan      Co-evaluation    PT/OT/SLP Co-Evaluation/Treatment: Yes Reason for Co-Treatment: For patient/therapist safety;To address functional/ADL transfers PT goals addressed during session: Mobility/safety with mobility;Balance OT goals addressed during session: ADL's and self-care      AM-PAC OT "6 Clicks" Daily Activity     Outcome Measure   Help from another person eating meals?: None Help from another person taking care of personal grooming?: A Little Help from another person toileting, which includes using toliet, bedpan, or urinal?: A Little Help from another person bathing (including washing, rinsing, drying)?: A Lot Help from another person to put on and taking off regular upper body clothing?: A Lot Help from another person to put on and taking off regular lower body clothing?: A Lot 6 Click Score: 16    End of Session Equipment Utilized During Treatment: Back  brace;Rolling walker (2 wheels) ([facility])  OT Visit Diagnosis: Other abnormalities of gait and mobility (R26.89);Pain;History of falling (Z91.81) Pain - part of body:  (mid back)   Activity Tolerance Patient tolerated treatment well   Patient Left in chair;with call bell/phone within reach;with chair alarm set   Nurse Communication Mobility status;Patient requests pain meds        Time: 4098-1191 OT Time Calculation (min): 29 min  Charges: OT General Charges $OT Visit: 1 Visit OT Treatments $Self Care/Home Management : 8-22 mins  Rockney Ghee, M.S., OTR/L 11/11/23, 3:14 PM

## 2023-11-11 NOTE — Progress Notes (Signed)
PROGRESS NOTE    TAEVON SENDRA   QVZ:[id number] DOB: 1961-10-21  DOA: 11/05/2023 Date of Service: 11/11/23 which is hospital day 6  PCP: Danelle Berry, PA-C    HPI: Jonathon Snow is a 62 y.o. 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 Patient suddenly felt lightheadedness while cooking breakfast alone at home and then passed out and collapsed on the floor and hit the backside of his head. Recovered consciousness by his own and seeing breakfast still cooking on the stove top. Unsure about duration of unconsciousness. Able to call EMS himself.   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  Noncritical stenosis of LAD and ramus, not stented.  CT head and neck negative for fracture or dislocation. Hypotensive requiring pressors, concern w/ hematoma forearm at cath site as well as abdomen, CT showed distraction injury T10, abdominal wall hematoma no concerns for internal hemorrhage. Neurosurgery consult and additional imaging.  11/15: T7-12 posterior fusion, Plt transfusion. Echo - EF 55-60%, no RWMA, G1 diast df ,mild elevated pulm artery pressure, no significant valvular disease.  Stable following surgery  11/16: hypotensive, back on pressors  11/17: off pressors. AKI worsening, nephrology consulted.   Consultants:  Cardiology  Critical Care General Surgery  Neurosurgery  Nephrology   Procedures/Surgeries: 11/14: cardiac cath w/ DES to the LCX  11/15: arterial line insertion 11/15: Open Reduction and internal fixation of T10 fracture, Posterolateral arthrodesis T7 to T12      ASSESSMENT & PLAN:   STEMI S/p successful LCx stenting 11/14 Echocardiogram no serious abnormality - EF 55-60%, no RWMA, G1 diast df ,mild  elevated pulm artery pressure, no significant valvular disease. Cardiology following  plavix, statin, apixaban Metop resumed today   Hypotension Resolved.   AKI on CKD stage IIIa Complicated by hx lupus nephritis and hypotension, recent contrast, hypotension Resolved, kidney function has returned to baseline today D/c fluids and monitor Hold cellcept until outpt f/u per nephro  Unstable thoracic spine fracture T10 s/p T7-12 posterior fusion 11/15  Neurosurgery following. TLSO brace Pain control - limiting opiates d/t hypotension  For SNF, TOC working on that, no beds yet  Thrombocytopenia  Bleeding/hematoma, high bleed risk  Forearm Hematoma noted 11/14 post-cath at TR band site Appears stable and actually improving  LLQ Abdominal wall hematoma  stable  Urine retention Catheterized 2 days ago but I see no report of why placed, that he failed CIC, etc. We are bladder scanning and CIC intermittently able to void overnight and this morning without retention Continue flomax  Syncope suspect MI associated ventricle arrhythmia vs vasovagal   Scalp laceration Gen surg evaluated, hemostatic and well-approximated, no indication for suture/stabple  Hx of PE/DVT and intracranial venous thrombosis On Eliquis prior to hospitalization. Cardiology discussed w/ neurosurg , safe to resume - apixaban     HTN Bp now elevated - metop started - start amlod - hold home losart given aki  SLE Stable holding CellCept until outpt nephro f/u Continue steroid 10 mg daily  Constipation Resolved with lactulose - continue miralax   DVT prophylaxis: apixaban Nutrition: regular diet   Code Status: FULL CODE ACP documentation reviewed:  none on file in [facility]  TOC needs: snf placement, bed search underway  Family Communication: daughter tiffany  updated telephonically 11/20    Subjective / Brief ROS:  Reports fatigue, tolerating diet, bm yesterday      Objective  Findings:  Vitals:   11/10/23 2025 11/11/23 0025 11/11/23 0357 11/11/23 0952  BP: (!) 173/80 (!) 160/85 (!) 166/96 (!) 172/86  Pulse: 90 79 86 81  Resp: 18 18 18 16   Temp: 98.1 F (36.7 C) 98.3 F (36.8 C) 98.8 F (37.1 C) 98 F (36.7 C)  TempSrc: Oral Oral Oral   SpO2: 98% 92% 91% 94%  Weight:      Height:        Intake/Output Summary (Last 24 hours) at 11/11/2023 1200 Last data filed at 11/11/2023 0900 Gross per 24 hour  Intake --  Output 475 ml  Net -475 ml   Filed Weights   11/05/23 1016 11/05/23 1301  Weight: 107.9 kg 102.8 kg    Examination:  NAD RRR CTAB Abdomen soft, bruise/hematoma lllq/flank Bruise left forearm No edema    Scheduled Medications:   acetaminophen  1,000 mg Oral Q6H   amLODipine  10 mg Oral Daily   apixaban  2.5 mg Oral BID   ARIPiprazole  2 mg Oral Daily   clopidogrel  75 mg Oral Daily   docusate sodium  100 mg Oral BID   gabapentin  200 mg Oral TID   lidocaine  2 patch Transdermal Q24H   loratadine  5 mg Oral QPM   metoprolol succinate  12.5 mg Oral Daily   montelukast  10 mg Oral QHS   polyethylene glycol  34 g Oral Daily   rosuvastatin  20 mg Oral Daily   senna  1 tablet Oral BID   sertraline  100 mg Oral Daily   sodium chloride flush  3 mL Intravenous Q12H   tamsulosin  0.4 mg Oral QPC supper    Continuous Infusions:    PRN Medications:  albuterol, LORazepam, magnesium citrate, methocarbamol **OR** methocarbamol (ROBAXIN) injection, ondansetron (ZOFRAN) IV, mouth rinse, oxyCODONE, oxyCODONE, sodium chloride flush, sorbitol  Antimicrobials from admission:  Anti-infectives (From admission, onward)    None           Data Reviewed:  I have personally reviewed the following...  CBC: Recent Labs  Lab 11/05/23 1015 11/05/23 1757 11/06/23 0053 11/07/23 1637 11/08/23 0403 11/09/23 0410 11/10/23 0636 11/11/23 0616  WBC 8.0 8.8   < > 8.7 7.6 5.6 4.6 5.8  NEUTROABS 6.8 7.2  --   --   --   --   --   --    HGB 16.8 14.0   < > 11.4* 10.5* 10.0* 10.2* 10.4*  HCT 52.9* 42.9   < > 35.8* 33.8* 31.5* 31.7* 31.9*  MCV 93.1 90.5   < > 94.7 94.9 93.5 91.9 89.1  PLT 149* 111*   < > 112* 102* 101* 122* 149*   < > = values in this interval not displayed.   Basic Metabolic Panel: Recent Labs  Lab 11/05/23 1015 11/05/23 1840 11/06/23 0425 11/07/23 0352 11/07/23 1637 11/08/23 0403 11/09/23 0410 11/10/23 0636 11/11/23 0616  NA 140   < > 136   < > 136 135 135 138 140  K 3.9   < > 4.4   < > 5.2* 4.6 4.0 3.9 4.0  CL 105   < > 105   < > 107 106 107 109 112*  CO2 25   < > 22   < > 21* 22 23 21* 21*  GLUCOSE 119*   < > 110*   < >  126* 108* 110* 97 96  BUN 24*   < > 28*   < > 36* 45* 47* 36* 30*  CREATININE 1.64*   < > 1.78*   < > 2.89* 3.31* 2.25* 1.36* 1.19  CALCIUM 9.2   < > 8.0*   < > 7.7* 7.7* 7.8* 8.2* 8.7*  MG 2.1  --  2.0  --   --   --   --   --   --   PHOS 2.6  --  3.8  --   --   --   --   --   --    < > = values in this interval not displayed.   GFR: Estimated Creatinine Clearance: 83.4 mL/min (by C-G formula based on SCr of 1.19 mg/dL). Liver Function Tests: Recent Labs  Lab 11/05/23 1015 11/05/23 1840  AST 21 17  ALT 19 17  ALKPHOS 73 57  BILITOT 1.2* 1.2*  PROT 7.9 5.9*  ALBUMIN 4.4 3.4*   Recent Labs  Lab 11/05/23 1840  LIPASE 34   No results for input(s): "AMMONIA" in the last 168 hours. Coagulation Profile: Recent Labs  Lab 11/05/23 1015  INR 1.2   Cardiac Enzymes: No results for input(s): "CKTOTAL", "CKMB", "CKMBINDEX", "TROPONINI" in the last 168 hours. BNP (last 3 results) No results for input(s): "PROBNP" in the last 8760 hours. HbA1C: No results for input(s): "HGBA1C" in the last 72 hours. CBG: Recent Labs  Lab 11/05/23 1256  GLUCAP 93   Lipid Profile: No results for input(s): "CHOL", "HDL", "LDLCALC", "TRIG", "CHOLHDL", "LDLDIRECT" in the last 72 hours.  Thyroid Function Tests: No results for input(s): "TSH", "T4TOTAL", "FREET4", "T3FREE",  "THYROIDAB" in the last 72 hours. Anemia Panel: No results for input(s): "VITAMINB12", "FOLATE", "FERRITIN", "TIBC", "IRON", "RETICCTPCT" in the last 72 hours. Most Recent Urinalysis On File:     Component Value Date/Time   COLORURINE YELLOW (A) 11/08/2023 1204   APPEARANCEUR HAZY (A) 11/08/2023 1204   APPEARANCEUR Clear 03/25/2023 0833   LABSPEC 1.017 11/08/2023 1204   LABSPEC 1.013 04/10/2015 1036   PHURINE 5.0 11/08/2023 1204   GLUCOSEU NEGATIVE 11/08/2023 1204   GLUCOSEU Negative 04/10/2015 1036   HGBUR LARGE (A) 11/08/2023 1204   BILIRUBINUR NEGATIVE 11/08/2023 1204   BILIRUBINUR Negative 03/25/2023 0833   BILIRUBINUR Negative 04/10/2015 1036   KETONESUR NEGATIVE 11/08/2023 1204   PROTEINUR NEGATIVE 11/08/2023 1204   UROBILINOGEN 0.2 07/23/2021 1423   UROBILINOGEN 0.2 03/17/2015 0025   NITRITE NEGATIVE 11/08/2023 1204   LEUKOCYTESUR SMALL (A) 11/08/2023 1204   LEUKOCYTESUR Negative 04/10/2015 1036   Sepsis Labs: @LABRCNTIP (procalcitonin:4,lacticidven:4) Microbiology: Recent Results (from the past 240 hour(s))  MRSA Next Gen by PCR, Nasal     Status: None   Collection Time: 11/05/23  1:12 PM   Specimen: Nasal Mucosa; Nasal Swab  Result Value Ref Range Status   MRSA by PCR Next Gen NOT DETECTED NOT DETECTED Final    Comment: (NOTE) The GeneXpert MRSA Assay (FDA approved for NASAL specimens only), is one component of a comprehensive MRSA colonization surveillance program. It is not intended to diagnose MRSA infection nor to guide or monitor treatment for MRSA infections. Test performance is not FDA approved in patients less than 25 years old. Performed at [facility], [address], [place], [name] [address]       Radiology Studies last 3 days: No results found.       Silvano Bilis, MD [facility] 11/11/2023, 12:00 PM    If 7PM-7AM,  please contact night coverage [email or web address]

## 2023-11-11 NOTE — Plan of Care (Signed)
  Problem: Health Behavior/Discharge Planning: Goal: Ability to manage health-related needs will improve Outcome: Progressing   Problem: Clinical Measurements: Goal: Ability to maintain clinical measurements within normal limits will improve Outcome: Progressing Goal: Will remain free from infection Outcome: Progressing Goal: Diagnostic test results will improve Outcome: Progressing Goal: Respiratory complications will improve Outcome: Progressing Goal: Cardiovascular complication will be avoided Outcome: Progressing   Problem: Activity: Goal: Risk for activity intolerance will decrease Outcome: Progressing   Problem: Nutrition: Goal: Adequate nutrition will be maintained Outcome: Progressing   Problem: Coping: Goal: Level of anxiety will decrease Outcome: Progressing   Problem: Elimination: Goal: Will not experience complications related to bowel motility Outcome: Progressing Goal: Will not experience complications related to urinary retention Outcome: Progressing   Problem: Pain Management: Goal: General experience of comfort will improve Outcome: Progressing   Problem: Safety: Goal: Ability to remain free from injury will improve Outcome: Progressing   Problem: Skin Integrity: Goal: Risk for impaired skin integrity will decrease Outcome: Progressing   Problem: Education: Goal: Understanding of CV disease, CV risk reduction, and recovery process will improve Outcome: Progressing   Problem: Activity: Goal: Ability to return to baseline activity level will improve Outcome: Progressing   Problem: Cardiovascular: Goal: Ability to achieve and maintain adequate cardiovascular perfusion will improve Outcome: Progressing Goal: Vascular access site(s) Level 0-1 will be maintained Outcome: Progressing   Problem: Health Behavior/Discharge Planning: Goal: Ability to safely manage health-related needs after discharge will improve Outcome: Progressing   Problem:  Education: Goal: Ability to verbalize activity precautions or restrictions will improve Outcome: Progressing Goal: Knowledge of the prescribed therapeutic regimen will improve Outcome: Progressing Goal: Understanding of discharge needs will improve Outcome: Progressing   Problem: Activity: Goal: Ability to avoid complications of mobility impairment will improve Outcome: Progressing Goal: Ability to tolerate increased activity will improve Outcome: Progressing Goal: Will remain free from falls Outcome: Progressing   Problem: Bowel/Gastric: Goal: Gastrointestinal status for postoperative course will improve Outcome: Progressing   Problem: Clinical Measurements: Goal: Ability to maintain clinical measurements within normal limits will improve Outcome: Progressing Goal: Postoperative complications will be avoided or minimized Outcome: Progressing Goal: Diagnostic test results will improve Outcome: Progressing   Problem: Pain Management: Goal: Pain level will decrease Outcome: Progressing   Problem: Skin Integrity: Goal: Will show signs of wound healing Outcome: Progressing   Problem: Health Behavior/Discharge Planning: Goal: Identification of resources available to assist in meeting health care needs will improve Outcome: Progressing   Problem: Bladder/Genitourinary: Goal: Urinary functional status for postoperative course will improve Outcome: Progressing

## 2023-11-11 NOTE — Progress Notes (Signed)
Physical Therapy Treatment Patient Details Name: Jonathon Snow MRN: [id number] DOB: [birth year] Today's Date: 11/11/2023   History of Present Illness Pt is a 62 yo male that presented with sudden lightheadness and syncope, admitted with new inferior ST segment elevation, underwent emergent cardiac cath and PCI, also s/p  open reduction and internal fixation of T10 fracture, posterior lateral arthrodesis T7-T12. PMH of COPD, HTN, CAD, past MI, PVD, DVT, cerbral venous sinus thrombosis,  lupus, lymphedema, PE, renal disorder stage III, back surgery    PT Comments  Pt is making good progress towards goals. Co-treat performed with OT this date. Pt still complaining of dizziness with exertion, orthostatics obtained. Very limited by pain and active N/V during session. TLSO donned and pt agreeable to sit in recliner. Continued education to nursing staff about importance of OOB for toileting as RN concerned about retaining urine. Will continue to progress as able. If pt able to demonstrate improved tolerance for rehab- consider upgrading recs to CIR.    If plan is discharge home, recommend the following: Two people to help with walking and/or transfers;Two people to help with bathing/dressing/bathroom;Direct supervision/assist for medications management;Help with stairs or ramp for entrance;Assist for transportation;Assistance with feeding;Assistance with cooking/housework   Can travel by private vehicle     No  Equipment Recommendations  Other (comment)    Recommendations for Other Services       Precautions / Restrictions Precautions Precautions: Back;Fall;Other (comment) Precaution Comments: A line removed, s/p R radial cath Restrictions Weight Bearing Restrictions: Yes RUE Weight Bearing: Non weight bearing LUE Weight Bearing: Weight bearing as tolerated Other Position/Activity Restrictions: LUE A line NWBing through wrist while A line is in - clarify with RN restrictions once removed; s/p  R radial - both essentially NWBing through wrists ~1 wk     Mobility  Bed Mobility Overal bed mobility: Needs Assistance Bed Mobility: Rolling, Supine to Sit, Sit to Supine Rolling: Min assist   Supine to sit: Min assist     General bed mobility comments: improved technique, still required cues for sequencing. Once seated, needs assist for donning TLSO. Still presents with dizziness with position change    Transfers Overall transfer level: Needs assistance Equipment used: Right platform walker Transfers: Sit to/from Stand Sit to Stand: +2 safety/equipment, From elevated surface, Mod assist           General transfer comment: Bed slightly elevated for ease of standing. First attempt with 1 assist and pt unable, needed +2 for safe technique in addition to cues. Once standing, dizziness still present.    Ambulation/Gait Ambulation/Gait assistance: Min assist, +2 safety/equipment Gait Distance (Feet): 5 Feet Assistive device: Right platform walker Gait Pattern/deviations: Step-to pattern       General Gait Details: pt able to take small steps from bed to [facility] and then to recliner. Pt with quick fatigue and nauseated, distance limited.   Stairs             Wheelchair Mobility     Tilt Bed    Modified Rankin (Stroke Patients Only)       Balance Overall balance assessment: Needs assistance Sitting-balance support: Feet supported, No upper extremity supported Sitting balance-Leahy Scale: Fair     Standing balance support: Bilateral upper extremity supported, During functional activity, Reliant on assistive device for balance Standing balance-Leahy Scale: Good  Cognition Arousal: Alert Behavior During Therapy: Restless Overall Cognitive Status: Within Functional Limits for tasks assessed                                 General Comments: very grouchy today, eventually agreeable to PT session         Exercises Other Exercises Other Exercises: orthostatics taken, placed in vitals Other Exercises: Assist for toileting at [facility] with OT'    General Comments        Pertinent Vitals/Pain Pain Assessment Pain Assessment: 0-10 Pain Score: 7  Pain Location: midline back Pain Descriptors / Indicators: Aching, Moaning, Sharp, Guarding, Grimacing Pain Intervention(s): Limited activity within patient's tolerance, Repositioned, Premedicated before session    Home Living                          Prior Function            PT Goals (current goals can now be found in the care plan section) Acute Rehab PT Goals Patient Stated Goal: to have less pain PT Goal Formulation: With patient Time For Goal Achievement: 11/22/23 Potential to Achieve Goals: Good Progress towards PT goals: Progressing toward goals    Frequency    7X/week      PT Plan      Co-evaluation PT/OT/SLP Co-Evaluation/Treatment: Yes Reason for Co-Treatment: For patient/therapist safety;To address functional/ADL transfers PT goals addressed during session: Mobility/safety with mobility;Balance OT goals addressed during session: ADL's and self-care      AM-PAC PT "6 Clicks" Mobility   Outcome Measure  Help needed turning from your back to your side while in a flat bed without using bedrails?: A Little Help needed moving from lying on your back to sitting on the side of a flat bed without using bedrails?: A Little Help needed moving to and from a bed to a chair (including a wheelchair)?: A Lot Help needed standing up from a chair using your arms (e.g., wheelchair or bedside chair)?: A Lot Help needed to walk in hospital room?: Total Help needed climbing 3-5 steps with a railing? : Total 6 Click Score: 12    End of Session Equipment Utilized During Treatment: Back brace Activity Tolerance: Patient limited by pain Patient left: in chair;with chair alarm set Nurse Communication: Mobility status PT  Visit Diagnosis: Difficulty in walking, not elsewhere classified (R26.2);Other abnormalities of gait and mobility (R26.89);Muscle weakness (generalized) (M62.81);Pain     Time: 8657-8469 PT Time Calculation (min) (ACUTE ONLY): 30 min  Charges:    $Gait Training: 8-22 mins PT General Charges $$ ACUTE PT VISIT: 1 Visit                     Elizabeth Palau, PT, DPT, GCS [phone]    Leanora Murin 11/11/2023, 3:00 PM

## 2023-11-11 NOTE — Progress Notes (Signed)
Rounding Note    Patient Name: Jonathon Snow Date of Encounter: 11/11/2023  [facility] Cardiologist: Yvonne Kendall, MD   Subjective   Continued back pain, slowly improving Yesterday reported some orthostatic lightheadedness when sitting up with PT Systolic pressures running 150s to 170s No documentation of significant blood pressure drops At home is on losartan 100, previously on amlodipine 5  Discussed syncopal events again, reports that he got very weak, dizzy and went down at home in the kitchen  Inpatient Medications    Scheduled Meds:  acetaminophen  1,000 mg Oral Q6H   amLODipine  10 mg Oral Daily   apixaban  2.5 mg Oral BID   ARIPiprazole  2 mg Oral Daily   clopidogrel  75 mg Oral Daily   docusate sodium  100 mg Oral BID   gabapentin  200 mg Oral TID   lidocaine  2 patch Transdermal Q24H   loratadine  5 mg Oral QPM   metoprolol succinate  12.5 mg Oral Daily   montelukast  10 mg Oral QHS   polyethylene glycol  34 g Oral Daily   rosuvastatin  20 mg Oral Daily   senna  1 tablet Oral BID   sertraline  100 mg Oral Daily   sodium chloride flush  3 mL Intravenous Q12H   tamsulosin  0.4 mg Oral QPC supper   Continuous Infusions:  PRN Meds: albuterol, LORazepam, magnesium citrate, methocarbamol **OR** methocarbamol (ROBAXIN) injection, ondansetron (ZOFRAN) IV, mouth rinse, oxyCODONE, oxyCODONE, sodium chloride flush, sorbitol   Vital Signs    Vitals:   11/10/23 2025 11/11/23 0025 11/11/23 0357 11/11/23 0952  BP: (!) 173/80 (!) 160/85 (!) 166/96 (!) 172/86  Pulse: 90 79 86 81  Resp: 18 18 18 16   Temp: 98.1 F (36.7 C) 98.3 F (36.8 C) 98.8 F (37.1 C) 98 F (36.7 C)  TempSrc: Oral Oral Oral   SpO2: 98% 92% 91% 94%  Weight:      Height:        Intake/Output Summary (Last 24 hours) at 11/11/2023 1154 Last data filed at 11/11/2023 0900 Gross per 24 hour  Intake --  Output 475 ml  Net -475 ml      11/05/2023    1:01 PM 11/05/2023    10:16 AM 10/28/2023    1:32 PM  Last 3 Weights  Weight (lbs) 226 lb 10.1 oz 237 lb 14 oz 230 lb 8 oz  Weight (kg) 102.8 kg 107.9 kg 104.554 kg      Telemetry    Normal sinus rhythm- Personally Reviewed  ECG     - Personally Reviewed  Physical Exam   GEN: No acute distress.   Neck: No JVD Cardiac: RRR, no murmurs, rubs, or gallops.  Respiratory: Clear to auscultation bilaterally. GI: Soft, nontender, non-distended  MS: No edema; No deformity. Neuro:  Nonfocal  Psych: Normal affect   Labs    High Sensitivity Troponin:   Recent Labs  Lab 11/05/23 1015 11/05/23 1346 11/05/23 1757 11/05/23 1840  TROPONINIHS 8 6 10 10      Chemistry Recent Labs  Lab 11/05/23 1015 11/05/23 1840 11/06/23 0425 11/07/23 0352 11/09/23 0410 11/10/23 0636 11/11/23 0616  NA 140 137 136   < > 135 138 140  K 3.9 4.0 4.4   < > 4.0 3.9 4.0  CL 105 109 105   < > 107 109 112*  CO2 25 22 22    < > 23 21* 21*  GLUCOSE 119* 96 110*   < >  110* 97 96  BUN 24* 24* 28*   < > 47* 36* 30*  CREATININE 1.64* 1.50* 1.78*   < > 2.25* 1.36* 1.19  CALCIUM 9.2 7.9* 8.0*   < > 7.8* 8.2* 8.7*  MG 2.1  --  2.0  --   --   --   --   PROT 7.9 5.9*  --   --   --   --   --   ALBUMIN 4.4 3.4*  --   --   --   --   --   AST 21 17  --   --   --   --   --   ALT 19 17  --   --   --   --   --   ALKPHOS 73 57  --   --   --   --   --   BILITOT 1.2* 1.2*  --   --   --   --   --   GFRNONAA 47* 53* 43*   < > 32* 59* >60  ANIONGAP 10 6 9    < > 5 8 7    < > = values in this interval not displayed.    Lipids  Recent Labs  Lab 11/05/23 1012  CHOL 199  TRIG 105  HDL 59  LDLCALC 119*  CHOLHDL 3.4    Hematology Recent Labs  Lab 11/09/23 0410 11/10/23 0636 11/11/23 0616  WBC 5.6 4.6 5.8  RBC 3.37* 3.45* 3.58*  HGB 10.0* 10.2* 10.4*  HCT 31.5* 31.7* 31.9*  MCV 93.5 91.9 89.1  MCH 29.7 29.6 29.1  MCHC 31.7 32.2 32.6  RDW 15.7* 15.5 15.3  PLT 101* 122* 149*   Thyroid No results for input(s): "TSH", "FREET4" in  the last 168 hours.  BNPNo results for input(s): "BNP", "PROBNP" in the last 168 hours.  DDimer No results for input(s): "DDIMER" in the last 168 hours.   Radiology    No results found.  Cardiac Studies     Patient Profile      Mr. Armbrecht is a 62 year old man with sudden lightheadedness and syncope earlier today, admitted with new inferior ST segment elevation prompting emergent cardiac catheterization and PCI to distal LCx.  Yesterday with open reduction and internal fixation of T10 fracture, posterior lateral arthrodesis T7-T12   Assessment & Plan    Inferior STEMI and syncope catheterization lab November 05, 2023, stent to distal left circumflex Plavix held following catheterization for back surgery, open reduction and internal fixation T10 fracture Continued on aspirin, Plavix restarted November 18 without loading dose Aspirin held to initiate Eliquis 2.5 twice daily Stable on Plavix metoprolol succinate 12.5 daily, Eliquis 2.5 twice daily Crestor 20 daily as detailed below,  Syncope Mechanism unclear, described as frank syncope Symptoms included profound acute weakness, dizziness Underwent ischemic workup November 14, stent placement distal left circumflex Trauma from fall requiring open reduction and internal fixation T10 fracture yesterday -Will need live Zio monitor at discharge   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

## 2023-11-11 NOTE — Progress Notes (Signed)
[facility] Jonathon Snow, [name] 11/11/23  Subjective:   Hospital day # 6  Patient known to our practice from office follow-up for lupus nephritis.  Last seen in October 2024.  He presented via EMS from home with code STEMI.  Per notes, he was cooking outside and felt dizzy and had a syncopal episode.  He sustained a laceration to the posterior part of his head. He underwent emergent coronary angiography and was found to have multivessel coronary artery disease.  He underwent PCI to left circumflex.  CT scan of the abdomen and pelvis showed thoracic spine fracture, left lower anterior abdominal wall subcutaneous hematoma.  He then underwent CT angiography of the chest which confirmed T10 vertebral body fracture with associated mild prevertebral stranding and hemorrhage.    He was evaluated by neurosurgery..  Because of various thoracic spine injuries, he is deemed to be extremely high risk for spinal cord injury.  T7-T12 fixation of the fracture and fusion of the thoracic spine was recommended.Anticoagulation with apixaban and aspirin and clopidogrel is on hold.  Patient underwent successful open reduction internal fixation of T10 fracture, posterior lateral arthrodesis T7-T12 on 11/06/2023.  Unfortunately patient has developed acute kidney injury.  His creatinine has increased from Baseline 1.35/GFR 60 on 09/29/2023   Update: Patient seen and evaluated at bedside. Renal function now close to normal with a creatinine of 1.2.   Objective:  Vital signs in last 24 hours:  Temp:  [97.6 F (36.4 C)-98.8 F (37.1 C)] 98.8 F (37.1 C) (11/20 0357) Pulse Rate:  [74-90] 86 (11/20 0357) Resp:  [16-18] 18 (11/20 0357) BP: (130-173)/(71-96) 166/96 (11/20 0357) SpO2:  [91 %-98 %] 91 % (11/20 0357)  Weight change:  Filed Weights   11/05/23 1016 11/05/23 1301  Weight: 107.9 kg 102.8 kg    Intake/Output:    Intake/Output Summary (Last 24 hours) at 11/11/2023 0943 Last  data filed at 11/10/2023 1017 Gross per 24 hour  Intake --  Output 475 ml  Net -475 ml     Physical Exam: General: No acute distress, laying in the bed  HEENT Moist oral mucous membranes  Pulm/lungs Normal breathing effort  CVS/Heart S1S2 no rubs  Abdomen:  Soft, nontender  Extremities: No edema  Neurologic: Alert, oriented  Skin: Left groin hematoma, right forearm hematoma  Foley In place       Basic Metabolic Panel:  Recent Labs  Lab 11/05/23 1015 11/05/23 1840 11/06/23 0425 11/07/23 0352 11/07/23 1637 11/08/23 0403 11/09/23 0410 11/10/23 0636 11/11/23 0616  NA 140   < > 136   < > 136 135 135 138 140  K 3.9   < > 4.4   < > 5.2* 4.6 4.0 3.9 4.0  CL 105   < > 105   < > 107 106 107 109 112*  CO2 25   < > 22   < > 21* 22 23 21* 21*  GLUCOSE 119*   < > 110*   < > 126* 108* 110* 97 96  BUN 24*   < > 28*   < > 36* 45* 47* 36* 30*  CREATININE 1.64*   < > 1.78*   < > 2.89* 3.31* 2.25* 1.36* 1.19  CALCIUM 9.2   < > 8.0*   < > 7.7* 7.7* 7.8* 8.2* 8.7*  MG 2.1  --  2.0  --   --   --   --   --   --   PHOS 2.6  --  3.8  --   --   --   --   --   --    < > = values in this interval not displayed.     CBC: Recent Labs  Lab 11/05/23 1015 11/05/23 1757 11/06/23 0053 11/07/23 1637 11/08/23 0403 11/09/23 0410 11/10/23 0636 11/11/23 0616  WBC 8.0 8.8   < > 8.7 7.6 5.6 4.6 5.8  NEUTROABS 6.8 7.2  --   --   --   --   --   --   HGB 16.8 14.0   < > 11.4* 10.5* 10.0* 10.2* 10.4*  HCT 52.9* 42.9   < > 35.8* 33.8* 31.5* 31.7* 31.9*  MCV 93.1 90.5   < > 94.7 94.9 93.5 91.9 89.1  PLT 149* 111*   < > 112* 102* 101* 122* 149*   < > = values in this interval not displayed.     No results found for: "HEPBSAG", "HEPBSAB", "HEPBIGM"    Microbiology:  Recent Results (from the past 240 hour(s))  MRSA Next Gen by PCR, Nasal     Status: None   Collection Time: 11/05/23  1:12 PM   Specimen: Nasal Mucosa; Nasal Swab  Result Value Ref Range Status   MRSA by PCR Next Gen NOT  DETECTED NOT DETECTED Final    Comment: (NOTE) The GeneXpert MRSA Assay (FDA approved for NASAL specimens only), is one component of a comprehensive MRSA colonization surveillance program. It is not intended to diagnose MRSA infection nor to guide or monitor treatment for MRSA infections. Test performance is not FDA approved in patients less than 28 years old. Performed at [facility], [address] [place], [name] [address]     Coagulation Studies: No results for input(s): "LABPROT", "INR" in the last 72 hours.   Urinalysis: Recent Labs    11/08/23 1204  COLORURINE YELLOW*  LABSPEC 1.017  PHURINE 5.0  GLUCOSEU NEGATIVE  HGBUR LARGE*  BILIRUBINUR NEGATIVE  KETONESUR NEGATIVE  PROTEINUR NEGATIVE  NITRITE NEGATIVE  LEUKOCYTESUR SMALL*      Imaging: No results found.   Medications:      acetaminophen  1,000 mg Oral Q6H   apixaban  2.5 mg Oral BID   ARIPiprazole  2 mg Oral Daily   clopidogrel  75 mg Oral Daily   docusate sodium  100 mg Oral BID   gabapentin  200 mg Oral TID   lidocaine  2 patch Transdermal Q24H   loratadine  5 mg Oral QPM   montelukast  10 mg Oral QHS   polyethylene glycol  34 g Oral Daily   rosuvastatin  20 mg Oral Daily   senna  1 tablet Oral BID   sertraline  100 mg Oral Daily   sodium chloride flush  3 mL Intravenous Q12H   tamsulosin  0.4 mg Oral QPC supper   albuterol, LORazepam, magnesium citrate, methocarbamol **OR** methocarbamol (ROBAXIN) injection, ondansetron (ZOFRAN) IV, mouth rinse, oxyCODONE, oxyCODONE, sodium chloride flush, sorbitol  Assessment/ Plan:  62 y.o. male with lupus nephritis, hypertension, mild CKD, lymphedema, secondary hyperparathyroidism, history of lumbar herpes zoster infection, history of pulmonary embolus with IVC filter placement, right ankle fracture status post ORIF, herniated disc status post L5-S1 fusion history of right axillary and left groin abscess in May 2018    admitted on 11/05/2023  for STEMI (ST elevation myocardial infarction) [facility]) [I21.3]  Hospital course is complicated by Scalp laceration, left circumflex PCI with stent placement, thoracic spine fracture status post T7-T12 fixation and thoracic  spine fusion by neurosurgery this admission, acute kidney injury  1.  Acute kidney injury on chronic kidney disease stage 3a with underlying lupus nephritis, hypotension.  Lupus nephritis has been managed with CellCept (1000 mg twice a day), hydroxychloroquine 200 mg p.o. daily, losartan 100 mg p.o. daily, prednisone 10 mg daily as outpatient. Most recent outpatient labs-09/29/2023-creatinine 1.36, GFR 59 AKI likely secondary to hypotension and multiple IV contrast exposures. Update: Renal function improved to his baseline.  Creatinine down to 1.2.  Continue supportive care.  Okay to hold CellCept until we reevaluate him as an outpatient.  2.  Anemia of chronic kidney disease.   Lab Results  Component Value Date   HGB 10.4 (L) 11/11/2023  Hemoglobin up slightly to 10.4.  No immediate need for Epogen.  Continue to monitor as outpatient.     LOS: 6 Jonathon Snow 11/20/20249:43 AM  [facility] [place], [name] [phone]  Note: This note was prepared with Dragon dictation. Any transcription errors are unintentional

## 2023-11-11 NOTE — Plan of Care (Signed)
  Problem: Clinical Measurements: Goal: Will remain free from infection Outcome: Progressing Goal: Respiratory complications will improve Outcome: Progressing   Problem: Nutrition: Goal: Adequate nutrition will be maintained Outcome: Progressing   Problem: Elimination: Goal: Will not experience complications related to bowel motility Outcome: Progressing   Problem: Health Behavior/Discharge Planning: Goal: Ability to manage health-related needs will improve Outcome: Not Progressing   Problem: Activity: Goal: Risk for activity intolerance will decrease Outcome: Not Progressing   Problem: Coping: Goal: Level of anxiety will decrease Outcome: Not Progressing   Problem: Pain Management: Goal: General experience of comfort will improve Outcome: Not Progressing   Problem: Activity: Goal: Ability to return to baseline activity level will improve Outcome: Not Progressing

## 2023-11-12 ENCOUNTER — Encounter: Payer: Medicaid Other | Admitting: Occupational Therapy

## 2023-11-12 DIAGNOSIS — S0990XA Unspecified injury of head, initial encounter: Secondary | ICD-10-CM | POA: Diagnosis not present

## 2023-11-12 DIAGNOSIS — I213 ST elevation (STEMI) myocardial infarction of unspecified site: Secondary | ICD-10-CM | POA: Diagnosis not present

## 2023-11-12 DIAGNOSIS — S22008K Other fracture of unspecified thoracic vertebra, subsequent encounter for fracture with nonunion: Secondary | ICD-10-CM

## 2023-11-12 DIAGNOSIS — R579 Shock, unspecified: Secondary | ICD-10-CM | POA: Diagnosis not present

## 2023-11-12 DIAGNOSIS — M532X4 Spinal instabilities, thoracic region: Secondary | ICD-10-CM | POA: Diagnosis not present

## 2023-11-12 DIAGNOSIS — S22078K Other fracture of T9-T10 vertebra, subsequent encounter for fracture with nonunion: Secondary | ICD-10-CM

## 2023-11-12 MED ORDER — LOSARTAN POTASSIUM 25 MG PO TABS
25.0000 mg | ORAL_TABLET | Freq: Every day | ORAL | Status: DC
  Administered 2023-11-12 – 2023-11-13 (×2): 25 mg via ORAL
  Filled 2023-11-12 (×2): qty 1

## 2023-11-12 MED ORDER — SERTRALINE HCL 50 MG PO TABS
150.0000 mg | ORAL_TABLET | Freq: Every day | ORAL | Status: DC
Start: 2023-11-13 — End: 2023-11-13
  Filled 2023-11-12: qty 3

## 2023-11-12 NOTE — Progress Notes (Signed)
PROGRESS NOTE    [name]   ZOX:[id number] DOB: 10-02-1961  DOA: 11/05/2023 Date of Service: 11/12/23 which is hospital day 7  PCP: Danelle Berry, PA-C    HPI: Jonathon Snow is a 62 y.o. 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 Patient suddenly felt lightheadedness while cooking breakfast alone at home and then passed out and collapsed on the floor and hit the backside of his head. Recovered consciousness by his own and seeing breakfast still cooking on the stove top. Unsure about duration of unconsciousness. Able to call EMS himself.   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  Noncritical stenosis of LAD and ramus, not stented.  CT head and neck negative for fracture or dislocation. Hypotensive requiring pressors, concern w/ hematoma forearm at cath site as well as abdomen, CT showed distraction injury T10, abdominal wall hematoma no concerns for internal hemorrhage. Neurosurgery consult and additional imaging.  11/15: T7-12 posterior fusion, Plt transfusion. Echo - EF 55-60%, no RWMA, G1 diast df ,mild elevated pulm artery pressure, no significant valvular disease.  Stable following surgery  11/16: hypotensive, back on pressors  11/17: off pressors. AKI worsening, nephrology consulted.  11/18: renal function improving, BP improving  11/19: d/c ASA, continue Plavix, restart Eliquis 2.5 mg bid 11/20: renal function now close to normal 11/21: pending SNF rehab   Consultants:  Cardiology  Critical Care General Surgery  Neurosurgery  Nephrology   Procedures/Surgeries: 11/14: cardiac cath w/ DES to the LCX  11/15: arterial line insertion 11/15: Open Reduction and internal fixation of T10 fracture,  Posterolateral arthrodesis T7 to T12      ASSESSMENT & PLAN:   STEMI S/p successful LCx stenting 11/14 Echocardiogram no serious abnormality - EF 55-60%, no RWMA, G1 diast df ,mild elevated pulm artery pressure, no significant valvular disease. Cardiology following DAPT w/ aspirin and clopidogrel for now. After 1 month, aspirin can be stopped and Eliquis resumed.  High potency statin Holding beta blocker and ACE/ARB d/t hypotension in setting of likely spinal shock +/- opiate effect see below  cardiology recs for outpatient functional study for elective revascularization Will need ZIO monitor at discharge!    Hypotension Ddx: neurogenic shock in setting of recent trauma/surgery, complicated by opiate adverse effect, less likely bleeding but watching this closely given thrombocytopenia and hematomas Norepinephrine, additional pressor support as needed - ICU following, pressors off today   IV fluids restarted given AKI Holding antihypertensives  Active bleeding low suspicion but closely monitoring HH/CBC  AKI on CKD stage IIIa Complicated by hx lupus nephritis and hypotension, recent contrast  IV fluids restarted this morning Monitor BMP Avoid nephrotoxins  Given SLE hx, will ask nephrology to consult --> monitor and will follow   Unstable thoracic spine fracture T10 s/p T7-12 posterior fusion 11/15  Neurosurgery following. TLSO brace Pain control - limiting opiates d/t hypotension   Thrombocytopenia  Bleeding/hematoma, high bleed risk  Forearm Hematoma noted 11/14 post-cath at TR band site Monitor CBC SCD DVT Ppx ASA, can restart Plavix Monday 11/18 per neurosurgery    LLQ Abdominal wall hematoma  serial CBCs  Holding heparin, ASA, plavix   Chest pain evening 11/14, subsequently bradycardic and hypotensive - resolved with heart rates into the 40s and systolic  pressures in the 60s. Concern for hypovolemia/bleeding, vasovagal urinary retention vs other, w/ newly found T10  fx possible cord syndrome most likely cause for neurogenic shock  Pressors - norepi weaned off 11/14 btu back on 11/16 Critical care team has s/o     Urine retention Cath as needed / Foley   Syncope suspect MI associated ventricle arrhythmia vs vasovagal  Pressure too low to consider beta-blocking right now   Scalp laceration Surgical consult for stapling   Hx of PE/DVT and intracranial venous thrombosis On Eliquis prior to hospitalization  Holding anticoag for now given bleeding issues  Expect patient discharged home on Eliquis    HTN Hold home antihypertensives for now given hypotension   SLE Stable holding CellCept Continue steroid 10 mg daily   overweight based on BMI: Body mass index is 29.1 kg/m.  Underweight - under 18.5  normal weight - 18.5 to 24.9 overweight - 25 to 29.9 obese - 30 or more   DVT prophylaxis: SCD IV fluids: gentle continuous IV fluids  given low BP / bleeding concern Nutrition: regular diet  Central lines / invasive devices: none  Code Status: FULL CODE ACP documentation reviewed:  none on file in [facility]  TOC needs: TBD Barriers to dispo / significant pending items: see above             Subjective / Brief ROS:  Patient reports doing okay today, no concerns Denies CP/SOB.  Pain controlled.  Denies new weakness.  Tolerating diet.  Reports no concerns w/ urination/defecation.   Family Communication: none at this time     Objective Findings:  Vitals:   11/11/23 2004 11/11/23 2344 11/12/23 0825 11/12/23 1227  BP: 135/74 130/71 (!) 158/91 (!) 146/77  Pulse: 83 83 78 71  Resp: 11  16 16   Temp: 98.6 F (37 C) 98.5 F (36.9 C) 97.9 F (36.6 C) 98.2 F (36.8 C)  TempSrc: Oral Oral Oral   SpO2: 93% 94% 94% 93%  Weight:      Height:        Intake/Output Summary (Last 24 hours) at 11/12/2023 1414 Last data filed at 11/12/2023 1030 Gross per 24 hour  Intake --  Output 1500 ml  Net -1500 ml   Filed Weights    11/05/23 1016 11/05/23 1301  Weight: 107.9 kg 102.8 kg    Examination:  Physical Exam Constitutional:      General: He is not in acute distress. Cardiovascular:     Rate and Rhythm: Normal rate and regular rhythm.  Pulmonary:     Effort: Pulmonary effort is normal.     Breath sounds: Normal breath sounds.  Abdominal:     General: Abdomen is flat.     Palpations: Abdomen is soft.  Musculoskeletal:     Right lower leg: No edema.     Left lower leg: No edema.  Skin:    General: Skin is warm and dry.  Neurological:     General: No focal deficit present.     Mental Status: He is alert and oriented to person, place, and time.  Psychiatric:        Mood and Affect: Mood normal.        Behavior: Behavior normal.          Scheduled Medications:   acetaminophen  1,000 mg Oral Q6H   amLODipine  10 mg Oral Daily   apixaban  2.5 mg Oral BID   ARIPiprazole  2 mg Oral Daily   clopidogrel  75 mg Oral Daily   docusate sodium  100 mg Oral BID   gabapentin  200 mg Oral TID   lidocaine  2 patch Transdermal Q24H   loratadine  5 mg Oral QPM   losartan  25 mg Oral Daily   metoprolol succinate  12.5 mg Oral Daily   montelukast  10 mg Oral QHS   polyethylene glycol  17 g Oral Daily   rosuvastatin  20 mg Oral Daily   senna  1 tablet Oral BID   [START ON 11/13/2023] sertraline  150 mg Oral Daily   sodium chloride flush  3 mL Intravenous Q12H   tamsulosin  0.4 mg Oral QPC supper    Continuous Infusions:   PRN Medications:  albuterol, famotidine, LORazepam, magnesium citrate, methocarbamol **OR** methocarbamol (ROBAXIN) injection, ondansetron (ZOFRAN) IV, mouth rinse, oxyCODONE, oxyCODONE, sodium chloride flush, sorbitol  Antimicrobials from admission:  Anti-infectives (From admission, onward)    None           Data Reviewed:  I have personally reviewed the following...  CBC: Recent Labs  Lab 11/05/23 1757 11/06/23 0053 11/07/23 1637 11/08/23 0403 11/09/23 0410  11/10/23 0636 11/11/23 0616  WBC 8.8   < > 8.7 7.6 5.6 4.6 5.8  NEUTROABS 7.2  --   --   --   --   --   --   HGB 14.0   < > 11.4* 10.5* 10.0* 10.2* 10.4*  HCT 42.9   < > 35.8* 33.8* 31.5* 31.7* 31.9*  MCV 90.5   < > 94.7 94.9 93.5 91.9 89.1  PLT 111*   < > 112* 102* 101* 122* 149*   < > = values in this interval not displayed.   Basic Metabolic Panel: Recent Labs  Lab 11/06/23 0425 11/07/23 0352 11/07/23 1637 11/08/23 0403 11/09/23 0410 11/10/23 0636 11/11/23 0616  NA 136   < > 136 135 135 138 140  K 4.4   < > 5.2* 4.6 4.0 3.9 4.0  CL 105   < > 107 106 107 109 112*  CO2 22   < > 21* 22 23 21* 21*  GLUCOSE 110*   < > 126* 108* 110* 97 96  BUN 28*   < > 36* 45* 47* 36* 30*  CREATININE 1.78*   < > 2.89* 3.31* 2.25* 1.36* 1.19  CALCIUM 8.0*   < > 7.7* 7.7* 7.8* 8.2* 8.7*  MG 2.0  --   --   --   --   --   --   PHOS 3.8  --   --   --   --   --   --    < > = values in this interval not displayed.   GFR: Estimated Creatinine Clearance: 83.4 mL/min (by C-G formula based on SCr of 1.19 mg/dL). Liver Function Tests: Recent Labs  Lab 11/05/23 1840  AST 17  ALT 17  ALKPHOS 57  BILITOT 1.2*  PROT 5.9*  ALBUMIN 3.4*   Recent Labs  Lab 11/05/23 1840  LIPASE 34   No results for input(s): "AMMONIA" in the last 168 hours. Coagulation Profile: No results for input(s): "INR", "PROTIME" in the last 168 hours.  Cardiac Enzymes: No results for input(s): "CKTOTAL", "CKMB", "CKMBINDEX", "TROPONINI" in the last 168 hours. BNP (last 3 results) No results for input(s): "PROBNP" in the last 8760 hours. HbA1C: No results for input(s): "HGBA1C" in the last 72 hours. CBG: No results for input(s): "GLUCAP" in the last 168 hours.  Lipid Profile:  No results for input(s): "CHOL", "HDL", "LDLCALC", "TRIG", "CHOLHDL", "LDLDIRECT" in the last 72 hours. Thyroid Function Tests: No results for input(s): "TSH", "T4TOTAL", "FREET4", "T3FREE", "THYROIDAB" in the last 72 hours. Anemia Panel: No  results for input(s): "VITAMINB12", "FOLATE", "FERRITIN", "TIBC", "IRON", "RETICCTPCT" in the last 72 hours. Most Recent Urinalysis On File:     Component Value Date/Time   COLORURINE YELLOW (A) 11/08/2023 1204   APPEARANCEUR HAZY (A) 11/08/2023 1204   APPEARANCEUR Clear 03/25/2023 0833   LABSPEC 1.017 11/08/2023 1204   LABSPEC 1.013 04/10/2015 1036   PHURINE 5.0 11/08/2023 1204   GLUCOSEU NEGATIVE 11/08/2023 1204   GLUCOSEU Negative 04/10/2015 1036   HGBUR LARGE (A) 11/08/2023 1204   BILIRUBINUR NEGATIVE 11/08/2023 1204   BILIRUBINUR Negative 03/25/2023 0833   BILIRUBINUR Negative 04/10/2015 1036   KETONESUR NEGATIVE 11/08/2023 1204   PROTEINUR NEGATIVE 11/08/2023 1204   UROBILINOGEN 0.2 07/23/2021 1423   UROBILINOGEN 0.2 03/17/2015 0025   NITRITE NEGATIVE 11/08/2023 1204   LEUKOCYTESUR SMALL (A) 11/08/2023 1204   LEUKOCYTESUR Negative 04/10/2015 1036   Sepsis Labs: @LABRCNTIP (procalcitonin:4,lacticidven:4) Microbiology: Recent Results (from the past 240 hour(s))  MRSA Next Gen by PCR, Nasal     Status: None   Collection Time: 11/05/23  1:12 PM   Specimen: Nasal Mucosa; Nasal Swab  Result Value Ref Range Status   MRSA by PCR Next Gen NOT DETECTED NOT DETECTED Final    Comment: (NOTE) The GeneXpert MRSA Assay (FDA approved for NASAL specimens only), is one component of a comprehensive MRSA colonization surveillance program. It is not intended to diagnose MRSA infection nor to guide or monitor treatment for MRSA infections. Test performance is not FDA approved in patients less than 35 years old. Performed at [facility], [address] [place], [name] [address]       Radiology Studies last 3 days: No results found.       Sunnie Nielsen, DO [facility] 11/12/2023, 2:14 PM    Dictation software may have been used to generate the above note. Typos may occur and escape review in typed/dictated notes. Please contact Dr Lyn Hollingshead directly  for clarity if needed.  Staff may message me via secure chat in Epic  but this may not receive an immediate response,  please page me for urgent matters!  If 7PM-7AM, please contact night coverage [email or web address]

## 2023-11-12 NOTE — Progress Notes (Signed)
   Patient Name: Jonathon Snow Date of Encounter: 11/12/2023 [facility] Cardiologist: Yvonne Kendall, MD   Interval Summary  .    Patient seen on a.m. rounds.  Denies any chest pain or shortness of breath.  Laying in bed having dressing changed to his back. Blood pressure has improved with additional medications added yesterday.  Pain continues to slowly improve.  Vital Signs .    Vitals:   11/11/23 2004 11/11/23 2344 11/12/23 0825 11/12/23 1227  BP: 135/74 130/71 (!) 158/91 (!) 146/77  Pulse: 83 83 78 71  Resp: 11  16 16   Temp: 98.6 F (37 C) 98.5 F (36.9 C) 97.9 F (36.6 C) 98.2 F (36.8 C)  TempSrc: Oral Oral Oral   SpO2: 93% 94% 94% 93%  Weight:      Height:        Intake/Output Summary (Last 24 hours) at 11/12/2023 1402 Last data filed at 11/12/2023 1030 Gross per 24 hour  Intake --  Output 1500 ml  Net -1500 ml      11/05/2023    1:01 PM 11/05/2023   10:16 AM 10/28/2023    1:32 PM  Last 3 Weights  Weight (lbs) 226 lb 10.1 oz 237 lb 14 oz 230 lb 8 oz  Weight (kg) 102.8 kg 107.9 kg 104.554 kg      Telemetry/ECG    Sinus  - Personally Reviewed  Physical Exam .   GEN: No acute distress.   Neck: No JVD Cardiac: RRR, no murmurs, rubs, or gallops.  Respiratory: Clear to auscultation bilaterally. GI: Soft, nontender, non-distended  MS: No edema  Assessment & Plan .     Inferior STEMI -Catheterization completed on 11/05/2023 with stent to the distal left circumflex -Aspirin remains on hold but he is continued on apixaban 2.5 mg twice daily -Continued on clopidogrel 75 mg daily and rosuvastatin 20 mg daily -Continued on telemetry monitoring -Currently remains chest pain-free -EKG as needed for pain or changes  Syncope -Mechanism unclear -Symptoms including profound acute weakness and dizziness -Ischemic workup completed with stent placement -Trauma from fall requiring open reduction internal fixation of T10 fracture yesterday -Will  require ZIO AT monitor on discharge  Hypertension with postoperative hypotension -Blood pressure issues have resolved -Blood pressure 146/77 -Elevation likely component of some postoperative pain -Continued on amlodipine 10 mg daily, Toprol-XL -Started on losartan 25 mg daily -Vital signs per unit protocol  Acute on chronic renal failure -Serum creatinine 1.19 this returned back to baseline -Continue to monitor urine output -Monitor/trend/replete electrolytes as needed -Daily BMP -Avoid nephrotoxic agents were able  T10 fracture -Pain gradually improving -Participating with PT -Continue care per neurosurgery and IM  Lupus and history of DVT/PE with history of intracranial venous sinus thrombosis -Continued on apixaban 2.5 mg twice daily -Continue to hold aspirin therapy -Daily CBC to monitor for bleeding -Further management of lupus per IM  For questions or updates, please contact [facility] Please consult [email or web address] for contact info under        Signed, Arva Slaugh, NP

## 2023-11-12 NOTE — Progress Notes (Signed)
Rounding Note    Patient Name: Jonathon Snow Date of Encounter: 11/12/2023  [facility] Cardiologist: Jonathon Kendall, MD   Subjective   Reports feeling well, no complaints Very slow improvement in his back pain Dressing on back being changed during rounds today Denies chest pain concerning for angina On amlodipine 10 daily, metoprolol succinate 12.5 daily   Inpatient Medications    Scheduled Meds:  acetaminophen  1,000 mg Oral Q6H   amLODipine  10 mg Oral Daily   apixaban  2.5 mg Oral BID   ARIPiprazole  2 mg Oral Daily   clopidogrel  75 mg Oral Daily   docusate sodium  100 mg Oral BID   gabapentin  200 mg Oral TID   lidocaine  2 patch Transdermal Q24H   loratadine  5 mg Oral QPM   metoprolol succinate  12.5 mg Oral Daily   montelukast  10 mg Oral QHS   polyethylene glycol  17 g Oral Daily   rosuvastatin  20 mg Oral Daily   senna  1 tablet Oral BID   [START ON 11/13/2023] sertraline  150 mg Oral Daily   sodium chloride flush  3 mL Intravenous Q12H   tamsulosin  0.4 mg Oral QPC supper   Continuous Infusions:  PRN Meds: albuterol, famotidine, LORazepam, magnesium citrate, methocarbamol **OR** methocarbamol (ROBAXIN) injection, ondansetron (ZOFRAN) IV, mouth rinse, oxyCODONE, oxyCODONE, sodium chloride flush, sorbitol   Vital Signs    Vitals:   11/11/23 2004 11/11/23 2344 11/12/23 0825 11/12/23 1227  BP: 135/74 130/71 (!) 158/91 (!) 146/77  Pulse: 83 83 78 71  Resp: 11  16 16   Temp: 98.6 F (37 C) 98.5 F (36.9 C) 97.9 F (36.6 C) 98.2 F (36.8 C)  TempSrc: Oral Oral Oral   SpO2: 93% 94% 94% 93%  Weight:      Height:        Intake/Output Summary (Last 24 hours) at 11/12/2023 1359 Last data filed at 11/12/2023 1030 Gross per 24 hour  Intake --  Output 2125 ml  Net -2125 ml      11/05/2023    1:01 PM 11/05/2023   10:16 AM 10/28/2023    1:32 PM  Last 3 Weights  Weight (lbs) 226 lb 10.1 oz 237 lb 14 oz 230 lb 8 oz  Weight (kg) 102.8  kg 107.9 kg 104.554 kg      Telemetry    Normal sinus rhythm- Personally Reviewed  ECG     - Personally Reviewed  Physical Exam   Constitutional:  oriented to person, place, and time.  Laying on his right shoulder in bed, back dressing being performed HENT:  Head: Grossly normal Eyes:  no discharge. No scleral icterus.  Neck: No JVD, no carotid bruits  Cardiovascular: Regular rate and rhythm, no murmurs appreciated Pulmonary/Chest: Clear to auscultation bilaterally, no wheezes or rails Abdominal: Soft.  no distension.  no tenderness.  Musculoskeletal: Normal range of motion Neurological:  normal muscle tone. Coordination normal. No atrophy Skin: Skin warm and dry Psychiatric: normal affect, pleasant   Labs    High Sensitivity Troponin:   Recent Labs  Lab 11/05/23 1015 11/05/23 1346 11/05/23 1757 11/05/23 1840  TROPONINIHS 8 6 10 10      Chemistry Recent Labs  Lab 11/05/23 1840 11/06/23 0425 11/07/23 0352 11/09/23 0410 11/10/23 0636 11/11/23 0616  NA 137 136   < > 135 138 140  K 4.0 4.4   < > 4.0 3.9 4.0  CL 109 105   < >  107 109 112*  CO2 22 22   < > 23 21* 21*  GLUCOSE 96 110*   < > 110* 97 96  BUN 24* 28*   < > 47* 36* 30*  CREATININE 1.50* 1.78*   < > 2.25* 1.36* 1.19  CALCIUM 7.9* 8.0*   < > 7.8* 8.2* 8.7*  MG  --  2.0  --   --   --   --   PROT 5.9*  --   --   --   --   --   ALBUMIN 3.4*  --   --   --   --   --   AST 17  --   --   --   --   --   ALT 17  --   --   --   --   --   ALKPHOS 57  --   --   --   --   --   BILITOT 1.2*  --   --   --   --   --   GFRNONAA 53* 43*   < > 32* 59* >60  ANIONGAP 6 9   < > 5 8 7    < > = values in this interval not displayed.    Lipids  No results for input(s): "CHOL", "TRIG", "HDL", "LABVLDL", "LDLCALC", "CHOLHDL" in the last 168 hours.   Hematology Recent Labs  Lab 11/09/23 0410 11/10/23 0636 11/11/23 0616  WBC 5.6 4.6 5.8  RBC 3.37* 3.45* 3.58*  HGB 10.0* 10.2* 10.4*  HCT 31.5* 31.7* 31.9*  MCV 93.5  91.9 89.1  MCH 29.7 29.6 29.1  MCHC 31.7 32.2 32.6  RDW 15.7* 15.5 15.3  PLT 101* 122* 149*   Thyroid No results for input(s): "TSH", "FREET4" in the last 168 hours.  BNPNo results for input(s): "BNP", "PROBNP" in the last 168 hours.  DDimer No results for input(s): "DDIMER" in the last 168 hours.   Radiology    No results found.  Cardiac Studies     Patient Profile      Jonathon Snow is a 62 year old man with sudden lightheadedness and syncope earlier today, admitted with new inferior ST segment elevation prompting emergent cardiac catheterization and PCI to distal LCx.  Yesterday with open reduction and internal fixation of T10 fracture, posterior lateral arthrodesis T7-T12   Assessment & Plan    Inferior STEMI and syncope catheterization lab November 05, 2023, stent to distal left circumflex Plavix held following catheterization for back surgery, open reduction and internal fixation T10 fracture Continued on aspirin, Plavix restarted November 18 without loading dose Aspirin held to initiate Eliquis 2.5 twice daily Continue on Plavix metoprolol succinate 12.5 daily, Eliquis 2.5 twice daily Crestor 20 daily   Syncope Mechanism unclear, described as frank syncope Symptoms included profound acute weakness, dizziness Underwent ischemic workup November 14, stent placement distal left circumflex Trauma from fall requiring open reduction and internal fixation T10 fracture yesterday -Will need live Zio monitor at discharge   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 -Restarted on Eliquis 2.5 twice daily, aspirin on hold, continue Plavix   Hypotension Initial hypotension in the setting of postsurgical, pain medication, anesthesia, back surgery  Echocardiogram with normal ejection fraction Off pressors, blood pressure stabilized On amlodipine, metoprolol succinate We will restart low-dose losartan 25 daily now that renal function stabilized  Acute on  chronic renal failure ATN in the setting of possible contrast nephropathy Creatinine close to baseline 1.19 Losartan as  above   [facility] will sign off.   Medication Recommendations: Medications as above Other recommendations (labs, testing, etc): No further testing Follow up as an outpatient: Outpatient follow-up with dr. end   For questions or updates, please contact [facility] Please consult [email or web address] for contact info under        Signed, Julien Nordmann, MD  11/12/2023, 1:59 PM

## 2023-11-12 NOTE — Progress Notes (Signed)
   Neurosurgery Progress Note  History: Jonathon Snow is s/p T7-T12 percutaneous PSF for T10 fracture  POD6: mild improvement in the severity of his back pain. POD4: Continued back pain. POD3: continued back pain this morning. Pt denies any radiating leg pain or associated numbness or tingling  Physical Exam: Vitals:   11/11/23 2344 11/12/23 0825  BP: 130/71 (!) 158/91  Pulse: 83 78  Resp:  16  Temp: 98.5 F (36.9 C) 97.9 F (36.6 C)  SpO2: 94% 94%    AA Ox3 CNI  Strength:5/5 throughout BLE Sensation intact Incisions intact with staples in place.  Notable areas of weeping blisters with serous fluid adjacent to incisions.  Assessment/Plan:  Jonathon Snow is a 62 y.o presenting after a fall resulting in T10 fracture s/p T7-T12 PSF.   - mobilize - pain control - DVT prophylaxis - PTOT. TLSO brace at bedside  - OK to leave incision open to air; placed a consult to wound for weeping blisters need incisions. We appreciate any recommendations - eliquis to restart this week per cardiology - will hold Cellcept until incision is healed. - on ASA/plavix due to CAD risk - ok to go to SNF/ rehab from neurosurgical standpoint  Manning Charity PA-C Department of Neurosurgery

## 2023-11-12 NOTE — Progress Notes (Signed)
Occupational Therapy Treatment Patient Details Name: Jonathon Snow MRN: [id number] DOB: 18-Jun-1961 Today's Date: 11/12/2023   History of present illness Pt is a 62 yo male that presented with sudden lightheadness and syncope, admitted with new inferior ST segment elevation, underwent emergent cardiac cath and PCI, also s/p  open reduction and internal fixation of T10 fracture, posterior lateral arthrodesis T7-T12. PMH of COPD, HTN, CAD, past MI, PVD, DVT, cerbral venous sinus thrombosis,  lupus, lymphedema, PE, renal disorder stage III, back surgery   OT comments  Mr. Cosgrove was seen for OT/PT co-treatment to maximize mobility progression and safety this date. Pt agreeable to Tx session. OT/PT facilitated ADL management with education and assist as described below. See ADL section for additional details regarding occupational performance. Pt continues to be functionally limited by increased pain with mobility, decreased activity tolerance, and generalized weakness. Pt return verbalizes understanding of education provided t/o session with good recall/carryover from previous sessions noted. Pt is progressing toward OT goals and continues to benefit from skilled OT services to maximize return to PLOF and minimize risk of future falls, injury, caregiver burden, and readmission. Will continue to follow POC as written. Discharge recommendation remains appropriate.       If plan is discharge home, recommend the following:  Two people to help with walking and/or transfers;Assistance with cooking/housework;Help with stairs or ramp for entrance;Assist for transportation;Two people to help with bathing/dressing/bathroom   Equipment Recommendations   (defer)    Recommendations for Other Services      Precautions / Restrictions Precautions Precautions: Back;Fall;Other (comment) Precaution Comments: A line removed, s/p R radial cath Restrictions Weight Bearing Restrictions: No RUE Weight Bearing: Non  weight bearing LUE Weight Bearing: Weight bearing as tolerated Other Position/Activity Restrictions: LUE A line NWBing through wrist while A line is in - clarify with RN restrictions once removed; s/p R radial - both essentially NWBing through wrists ~1 wk       Mobility Bed Mobility Overal bed mobility: Needs Assistance Bed Mobility: Rolling, Sidelying to Sit, Sit to Sidelying Rolling: Min assist Sidelying to sit: Min assist     Sit to sidelying: Min assist General bed mobility comments: improved technique, still required cues for sequencing. Once seated, needs assist for donning TLSO. Still presents with dizziness with position change    Transfers Overall transfer level: Needs assistance Equipment used: Right platform walker Transfers: Sit to/from Stand Sit to Stand: +2 safety/equipment, From elevated surface, Mod assist, Min assist           General transfer comment: Dizziness/nausea improved from previous date. Pt is able to perform functional mobility in room and hall with +2 for safety/chair follow. See PT note.     Balance Overall balance assessment: Needs assistance Sitting-balance support: Feet supported, No upper extremity supported Sitting balance-Leahy Scale: Good     Standing balance support: Bilateral upper extremity supported, During functional activity, Reliant on assistive device for balance Standing balance-Leahy Scale: Good                             ADL either performed or assessed with clinical judgement   ADL Overall ADL's : Needs assistance/impaired                 Upper Body Dressing : Sitting;Moderate assistance Upper Body Dressing Details (indicate cue type and reason): MOD A to don TLSO in sitting, MIN A to doff.     Toilet Transfer: +2  for safety/equipment;Moderate assistance;BSC/3in1;Minimal assistance Toilet Transfer Details (indicate cue type and reason): Continues to require some assistance for STS from [facility], but  improved transfer technique and tolerance from previous session. +2 for safety Toileting- Clothing Manipulation and Hygiene: Sitting/lateral lean;Supervision/safety;Set up       Functional mobility during ADLs: Rolling walker (2 wheels);+2 for safety/equipment;Minimal assistance;Moderate assistance General ADL Comments: Variable assist t/o session, but generally +2 MIN-MOD A for STS t/fs from variable heigts during session. Pt is able to STS from bed in lowest position with increased asssit. Requires cueing to adhere to back/broken wing precautions t/o session.    Extremity/Trunk Assessment              Vision Patient Visual Report: No change from baseline     Perception     Praxis      Cognition Arousal: Alert Behavior During Therapy: WFL for tasks assessed/performed Overall Cognitive Status: Within Functional Limits for tasks assessed                                 General Comments: Pt in better spirits today, more eager/participatory in therapy session.        Exercises Other Exercises Other Exercises: OT/PT assisted with ADL management as described below. See ADL section for additional details.    Shoulder Instructions       General Comments      Pertinent Vitals/ Pain       Pain Assessment Pain Assessment: 0-10 Pain Score: 7  Pain Location: back pain Pain Descriptors / Indicators: Aching, Moaning, Sharp, Guarding, Grimacing Pain Intervention(s): Limited activity within patient's tolerance, Monitored during session, Patient requesting pain meds-RN notified, Repositioned  Home Living                                          Prior Functioning/Environment              Frequency  Min 1X/week        Progress Toward Goals  OT Goals(current goals can now be found in the care plan section)  Progress towards OT goals: Progressing toward goals  Acute Rehab OT Goals Patient Stated Goal: To feel better, have less  pain OT Goal Formulation: With patient Time For Goal Achievement: 11/22/23 Potential to Achieve Goals: Good  Plan      Co-evaluation    PT/OT/SLP Co-Evaluation/Treatment: Yes Reason for Co-Treatment: For patient/therapist safety;To address functional/ADL transfers PT goals addressed during session: Mobility/safety with mobility;Balance OT goals addressed during session: ADL's and self-care      AM-PAC OT "6 Clicks" Daily Activity     Outcome Measure   Help from another person eating meals?: None Help from another person taking care of personal grooming?: A Little Help from another person toileting, which includes using toliet, bedpan, or urinal?: A Little Help from another person bathing (including washing, rinsing, drying)?: A Lot Help from another person to put on and taking off regular upper body clothing?: A Lot Help from another person to put on and taking off regular lower body clothing?: A Lot 6 Click Score: 16    End of Session Equipment Utilized During Treatment: Back brace;Rolling walker (2 wheels)  OT Visit Diagnosis: Other abnormalities of gait and mobility (R26.89);Pain;History of falling (Z91.81) Pain - part of body:  (Mid back)   Activity Tolerance  Patient tolerated treatment well   Patient Left in chair;with call bell/phone within reach;with chair alarm set   Nurse Communication Mobility status;Patient requests pain meds        Time: 4403-4742 OT Time Calculation (min): 27 min  Charges: OT General Charges $OT Visit: 1 Visit OT Treatments $Self Care/Home Management : 8-22 mins  Rockney Ghee, M.S., OTR/L 11/12/23, 3:27 PM

## 2023-11-12 NOTE — Progress Notes (Signed)
Physical Therapy Treatment Patient Details Name: Jonathon Snow MRN: [id number] DOB: 20-May-1961 Today's Date: 11/12/2023   History of Present Illness Pt is a 62 yo male that presented with sudden lightheadness and syncope, admitted with new inferior ST segment elevation, underwent emergent cardiac cath and PCI, also s/p  open reduction and internal fixation of T10 fracture, posterior lateral arthrodesis T7-T12. PMH of COPD, HTN, CAD, past MI, PVD, DVT, cerbral venous sinus thrombosis,  lupus, lymphedema, PE, renal disorder stage III, back surgery    PT Comments   PT/OT co-treat 2/2 to pt's limited activity tolerance. Pt was able to exit R side of bed, stand to RW, and ambulate 3 x with chair follow. TLSO applied at EOB prior to standing. Further distance walked ~ 50 ft.  Encouraged more upright posture throughout session and standing activity. Pt is far from his baseline abilities and will greatly benefit from continued skilled PT to maximize independence and safety with all ADLs.    If plan is discharge home, recommend the following: A lot of help with walking and/or transfers;A lot of help with bathing/dressing/bathroom;Assistance with cooking/housework;Assist for transportation;Help with stairs or ramp for entrance     Equipment Recommendations  Other (comment) (Defer to next level of care)       Precautions / Restrictions Precautions Precautions: Back;Fall;Other (comment) Precaution Comments: A line removed, s/p R radial cath Restrictions Weight Bearing Restrictions: No RUE Weight Bearing: Non weight bearing (( need to clarify with MD)) LUE Weight Bearing: Weight bearing as tolerated Other Position/Activity Restrictions: LUE A line NWBing through wrist while A line is in - clarify with RN restrictions once removed; s/p R radial - both essentially NWBing through wrists ~1 wk     Mobility  Bed Mobility Overal bed mobility: Needs Assistance Bed Mobility: Rolling, Sidelying to Sit, Sit  to Sidelying, Sit to Supine Rolling: Min assist Sidelying to sit: Min assist  Sit to sidelying: Min assist   Transfers Overall transfer level: Needs assistance Equipment used: Right platform walker Transfers: Sit to/from Stand Sit to Stand: +2 safety/equipment, From elevated surface, Mod assist, Min assist     Ambulation/Gait Ambulation/Gait assistance: Contact guard assist, +2 safety/equipment (chair follow for safety) Gait Distance (Feet): 50 Feet Assistive device: Right platform walker Gait Pattern/deviations: Step-to pattern Gait velocity: decreased  General Gait Details: pt ambulated 3 x with further distance ~ 50 ft    Balance Overall balance assessment: Needs assistance Sitting-balance support: Feet supported, No upper extremity supported Sitting balance-Leahy Scale: Good     Standing balance support: Bilateral upper extremity supported, During functional activity, Reliant on assistive device for balance Standing balance-Leahy Scale: Good       Cognition Arousal: Alert Behavior During Therapy: WFL for tasks assessed/performed Overall Cognitive Status: Within Functional Limits for tasks assessed    General Comments: Pt is A and O and agreeable to sesison        Exercises Other Exercises Other Exercises: OT/PT assisted with ADL management in OT note. See ADL section for additional details.     Pertinent Vitals/Pain Pain Assessment Pain Assessment: 0-10 Pain Score: 6  Pain Location: back pain Pain Descriptors / Indicators: Aching, Moaning, Sharp, Guarding, Grimacing Pain Intervention(s): Limited activity within patient's tolerance, Monitored during session, Premedicated before session, Repositioned, Patient requesting pain meds-RN notified           PT Goals (current goals can now be found in the care plan section) Acute Rehab PT Goals Patient Stated Goal: to have less pain  Progress towards PT goals: Progressing toward goals    Frequency     7X/week       Co-evaluation   Reason for Co-Treatment: For patient/therapist safety;To address functional/ADL transfers PT goals addressed during session: Mobility/safety with mobility;Balance OT goals addressed during session: ADL's and self-care      AM-PAC PT "6 Clicks" Mobility   Outcome Measure  Help needed turning from your back to your side while in a flat bed without using bedrails?: A Little Help needed moving from lying on your back to sitting on the side of a flat bed without using bedrails?: A Lot Help needed moving to and from a bed to a chair (including a wheelchair)?: A Lot Help needed standing up from a chair using your arms (e.g., wheelchair or bedside chair)?: A Lot Help needed to walk in hospital room?: A Lot Help needed climbing 3-5 steps with a railing? : Total 6 Click Score: 12    End of Session Equipment Utilized During Treatment: Back brace Activity Tolerance: Patient limited by pain Patient left: in bed;with call bell/phone within reach;with bed alarm set Nurse Communication: Mobility status PT Visit Diagnosis: Difficulty in walking, not elsewhere classified (R26.2);Other abnormalities of gait and mobility (R26.89);Muscle weakness (generalized) (M62.81);Pain     Time: 1610-9604 PT Time Calculation (min) (ACUTE ONLY): 29 min  Charges:    $Gait Training: 8-22 mins PT General Charges $$ ACUTE PT VISIT: 1 Visit                     Jetta Lout PTA 11/12/23, 4:13 PM

## 2023-11-12 NOTE — TOC Progression Note (Addendum)
Transition of Care [facility]) - Progression Note    Patient Details  Name: Jonathon Snow MRN: [id number] Date of Birth: November 28, 1961  [facility] of [facility] CM/SW Contact  Darolyn Rua, [name] Phone Number: 11/12/2023, 12:52 PM  Clinical Narrative:     Update 2:05 pm: Chose [facility] - CSW called facility at [phone] they report they will start insurance auth and they can take him Monday. Treatment team updated, daughter updated.    CSW spoke with patient daughter regarding bed offers that are not local. She requested email of list be sent to [email or web address] and she will discuss options with her brother and let CSW know choice.        Expected Discharge Plan and Services                                               Social Determinants of Health (SDOH) Interventions SDOH Screenings   Food Insecurity: No Food Insecurity (11/10/2023)  Housing: Low Risk  (02/19/2023)  Transportation Needs: No Transportation Needs (11/10/2023)  Utilities: Not At Risk (11/10/2023)  Alcohol Screen: Low Risk  (10/28/2023)  Depression (PHQ2-9): Low Risk  (10/28/2023)  Social Connections: Unknown (04/25/2022)   Received from [facility]  Tobacco Use: Low Risk  (10/28/2023)    Readmission Risk Interventions     No data to display

## 2023-11-12 NOTE — Consult Note (Signed)
WOC Nurse Consult Note: Reason for Consult:Medical adhesive related skin injury to spinal surgical site.  Staples clean dry and intact, periphery with ruptured serum filled blistering from honeycomb dressing.  Will implement topical wound care. Wound type:inflammatory from medical adhesive (honeycomb dressing)  No known adhesive allergies or sensitivity noted before, he states.  Pressure Injury POA: NA Measurement: Entire periphery of staple line along thoracic spine due to surgical repair of fracture from trauma Wound bed: red and moist  Drainage (amount, consistency, odor) minimal serosanguinous no odor Periwound: staple line is intact Dressing procedure/placement/frequency: Cleanse irritation around staples with NS and pat dry. Apply xeroform to irritation (not over staples) and cover entire area with silicone foam. Change daily.   Will not follow at this time.  Please re-consult if needed.  Jonathon Gip MSN, RN, FNP-BC CWON Wound, Ostomy, Continence Nurse [facility] [phone] Pager [phone]

## 2023-11-12 NOTE — Progress Notes (Signed)
PT Cancellation Note  Patient Details Name: Jonathon Snow MRN: [id number] DOB: [birth year]   Cancelled Treatment:     PT attempt. Pt requested author return after lunch at 1pm. Will return later this date as requested.    Rushie Chestnut 11/12/2023, 7:33 AM

## 2023-11-13 ENCOUNTER — Telehealth (HOSPITAL_COMMUNITY): Payer: Self-pay | Admitting: Pharmacy Technician

## 2023-11-13 ENCOUNTER — Other Ambulatory Visit (HOSPITAL_COMMUNITY): Payer: Self-pay

## 2023-11-13 ENCOUNTER — Telehealth: Payer: Self-pay | Admitting: Internal Medicine

## 2023-11-13 ENCOUNTER — Telehealth: Payer: Self-pay | Admitting: *Deleted

## 2023-11-13 DIAGNOSIS — R55 Syncope and collapse: Secondary | ICD-10-CM

## 2023-11-13 DIAGNOSIS — I2121 ST elevation (STEMI) myocardial infarction involving left circumflex coronary artery: Secondary | ICD-10-CM | POA: Diagnosis not present

## 2023-11-13 LAB — CBC
HCT: 31.7 % — ABNORMAL LOW (ref 39.0–52.0)
Hemoglobin: 10.3 g/dL — ABNORMAL LOW (ref 13.0–17.0)
MCH: 29.6 pg (ref 26.0–34.0)
MCHC: 32.5 g/dL (ref 30.0–36.0)
MCV: 91.1 fL (ref 80.0–100.0)
Platelets: 165 10*3/uL (ref 150–400)
RBC: 3.48 MIL/uL — ABNORMAL LOW (ref 4.22–5.81)
RDW: 15.7 % — ABNORMAL HIGH (ref 11.5–15.5)
WBC: 5.5 10*3/uL (ref 4.0–10.5)
nRBC: 0 % (ref 0.0–0.2)

## 2023-11-13 LAB — BASIC METABOLIC PANEL
Anion gap: 10 (ref 5–15)
BUN: 26 mg/dL — ABNORMAL HIGH (ref 8–23)
CO2: 22 mmol/L (ref 22–32)
Calcium: 8.3 mg/dL — ABNORMAL LOW (ref 8.9–10.3)
Chloride: 105 mmol/L (ref 98–111)
Creatinine, Ser: 1.39 mg/dL — ABNORMAL HIGH (ref 0.61–1.24)
GFR, Estimated: 58 mL/min — ABNORMAL LOW (ref >60)
Glucose, Bld: 101 mg/dL — ABNORMAL HIGH (ref 70–99)
Potassium: 3.7 mmol/L (ref 3.5–5.1)
Sodium: 137 mmol/L (ref 135–145)

## 2023-11-13 MED ORDER — ROSUVASTATIN CALCIUM 20 MG PO TABS: 20.0000 mg | ORAL_TABLET | Freq: Every day | ORAL | 0 refills | Status: DC

## 2023-11-13 MED ORDER — GABAPENTIN 100 MG PO CAPS: 200.0000 mg | ORAL_CAPSULE | Freq: Three times a day (TID) | ORAL | 0 refills | Status: DC

## 2023-11-13 MED ORDER — METOPROLOL SUCCINATE ER 25 MG PO TB24: 12.5000 mg | ORAL_TABLET | Freq: Every day | ORAL | 0 refills | Status: DC

## 2023-11-13 MED ORDER — LOSARTAN POTASSIUM 25 MG PO TABS: 25.0000 mg | ORAL_TABLET | Freq: Every day | ORAL | 0 refills | Status: DC

## 2023-11-13 MED ORDER — METHOCARBAMOL 500 MG PO TABS: 500.0000 mg | ORAL_TABLET | Freq: Three times a day (TID) | ORAL | 0 refills | Status: DC | PRN

## 2023-11-13 MED ORDER — LIDOCAINE 5 % EX PTCH: 2.0000 | MEDICATED_PATCH | CUTANEOUS | 0 refills | Status: DC

## 2023-11-13 MED ORDER — AMLODIPINE BESYLATE 5 MG PO TABS: 5.0000 mg | ORAL_TABLET | Freq: Every day | ORAL | 0 refills | Status: DC

## 2023-11-13 MED ORDER — SERTRALINE HCL 100 MG PO TABS: 150.0000 mg | ORAL_TABLET | Freq: Every day | ORAL | Status: DC

## 2023-11-13 MED ORDER — OXYCODONE HCL 5 MG PO TABS: 5.0000 mg | ORAL_TABLET | Freq: Four times a day (QID) | ORAL | 0 refills | Status: DC | PRN

## 2023-11-13 MED ORDER — HYDROXYZINE HCL 25 MG PO TABS: 25.0000 mg | ORAL_TABLET | Freq: Two times a day (BID) | ORAL | Status: DC | PRN

## 2023-11-13 MED ORDER — TAMSULOSIN HCL 0.4 MG PO CAPS: 0.4000 mg | ORAL_CAPSULE | Freq: Every day | ORAL | 0 refills | Status: DC

## 2023-11-13 MED ORDER — CLOPIDOGREL BISULFATE 75 MG PO TABS: 75.0000 mg | ORAL_TABLET | Freq: Every day | ORAL | 0 refills | Status: DC

## 2023-11-13 MED ORDER — SENNA 8.6 MG PO TABS: 2.0000 | ORAL_TABLET | Freq: Every day | ORAL | 0 refills | Status: DC

## 2023-11-13 MED ORDER — MYCOPHENOLATE MOFETIL 500 MG PO TABS: 1000.0000 mg | ORAL_TABLET | Freq: Two times a day (BID) | ORAL | Status: AC

## 2023-11-13 MED ORDER — APIXABAN 2.5 MG PO TABS: 2.5000 mg | ORAL_TABLET | Freq: Two times a day (BID) | ORAL | 0 refills | Status: DC

## 2023-11-13 MED ORDER — AMLODIPINE BESYLATE 10 MG PO TABS: 10.0000 mg | ORAL_TABLET | Freq: Every day | ORAL | 0 refills | Status: DC

## 2023-11-13 NOTE — Telephone Encounter (Signed)
Pharmacy Patient Advocate Encounter  Received notification from [facility] that Prior Authorization for Lidocaine 5% patches has been APPROVED from 11/13/2023 to 11/12/2024. Ran test claim, Copay is $4.00. This test claim was processed through [facility]- copay amounts may vary at other pharmacies due to pharmacy/plan contracts, or as the patient moves through the different stages of their insurance plan.   PA #/Case ID/Reference #: [id number] Key: [id number]

## 2023-11-13 NOTE — Discharge Summary (Signed)
Physician Discharge Summary   Patient: Jonathon Snow MRN: [id number]  DOB: 1961-05-29   Admit:     Date of Admission: 11/05/2023 Admitted from: home   Discharge: Date of discharge: 11/13/23 Disposition: Home Condition at discharge: good  CODE STATUS: FULL CODE     Discharge Physician: Sunnie Nielsen, DO [facility]     PCP: Danelle Berry, PA-C  Recommendations for Outpatient Follow-up:  Follow up with PCP Danelle Berry, PA-C in 1-2 weeks Follow up with Dr Lucienne Capers office (neurosurgery) in 1 week to monitor postop  Follow up with [facility] cardiology in 1-2 weeks  Please obtain labs/tests: BMP, CBC in 1 week or sooner.  Needs Zio monitor - this will be mailed to him per cardiology    Discharge Instructions     AMB Referral to Cardiac Rehabilitation - Phase II   Complete by: As directed    Diagnosis:  Coronary Stents STEMI     After initial evaluation and assessments completed: Virtual Based Care may be provided alone or in conjunction with Phase 2 Cardiac Rehab based on patient barriers.: Yes   Intensive Cardiac Rehabilitation (ICR) [facility] location only OR Traditional Cardiac Rehabilitation (TCR) *If criteria for ICR are not met will enroll in TCR [facility] only): Yes   Diet - low sodium heart healthy   Complete by: As directed    Discharge wound care:   Complete by: As directed    Cleanse irritation around staples with NS and pat dry. Apply xeroform to irritation (not over staples) and cover entire area with silicone foam. Change daily.   Incentive spirometry RT   Complete by: As directed    Increase activity slowly   Complete by: As directed          Discharge Diagnoses: Principal Problem:   STEMI (ST elevation myocardial infarction) (HCC) Active Problems:   Acute renal failure with acute tubular necrosis superimposed on stage 3b chronic kidney disease (HCC)   Syncope and collapse   Heart attack (HCC)   Head injury   Closed T10 spinal fracture  (HCC)   Closed tricolumnar fracture of thoracic vertebra (HCC)   Thoracic spine instability   Shock circulatory (HCC)   Hematoma of groin   Unstable angina (HCC)   Acute urinary retention        HPI: Jonathon Snow is a 62 y.o. 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 Patient suddenly felt lightheadedness while cooking breakfast alone at home and then passed out and collapsed on the floor and hit the backside of his head. Recovered consciousness by his own and seeing breakfast still cooking on the stove top. Unsure about duration of unconsciousness. Able to call EMS himself.   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  Noncritical stenosis of LAD and ramus, not stented.  CT head and neck negative for fracture or dislocation. Hypotensive requiring pressors, concern w/ hematoma forearm at cath site as well as abdomen, CT showed distraction injury T10, abdominal wall hematoma no concerns for internal hemorrhage. Neurosurgery consult and additional imaging.  11/15: T7-12 posterior fusion, Plt transfusion. Echo - EF 55-60%, no RWMA, G1 diast df ,mild elevated pulm artery pressure, no significant valvular disease.  Stable following surgery  11/16: hypotensive,  back on pressors  11/17: off pressors. AKI worsening, nephrology consulted.  11/18: renal function improving, BP improving  11/19: d/c ASA, continue Plavix, restart Eliquis 2.5 mg bid 11/20: renal function now close to normal 11/21: pending SNF rehab 11/22: iomproving enough not needing rehab at this time, ok for d/c, cardiology will mail Zio monitor, following up w/ PCP, cardiology, neurosurgery    Consultants:  Cardiology  Critical Care General Surgery   Neurosurgery  Nephrology   Procedures/Surgeries: 11/14: cardiac cath w/ DES to the LCX  11/15: arterial line insertion 11/15: Open Reduction and internal fixation of T10 fracture, Posterolateral arthrodesis T7 to T12      ASSESSMENT & PLAN:   STEMI S/p successful LCx stenting 11/14 Echocardiogram no serious abnormality - EF 55-60%, no RWMA, G1 diast df ,mild elevated pulm artery pressure, no significant valvular disease. No ASA d/t on Eliquis + Plavix  High potency statin Adjusted doses beta blocker and ACE/ARB and amlodipine, lower dose ACE/ARB d/t recovering renal function  cardiology recs for outpatient functional study for elective revascularization Will need ZIO monitor at discharge - cardiology team will mail this    Hypotension - resolved  Ddx: neurogenic shock in setting of recent trauma/surgery, complicated by opiate adverse effect, less likely bleeding but watching this closely given thrombocytopenia and hematomas Monitor BP outpatient   AKI on CKD stage IIIa - improved  Complicated by hx lupus nephritis and hypotension, recent contrast  Monitor BMP Avoid nephrotoxins   Unstable thoracic spine fracture T10 s/p T7-12 posterior fusion 11/15  Neurosurgery following outpatient. TLSO brace, outpatient PT/OT  Pain control - limiting opiates as able  Thrombocytopenia  Bleeding/hematoma, high bleed risk - improved  LLQ Abdominal wall hematoma - improved  Forearm Hematoma noted 11/14 post-cath at TR band site - improved Monitor CBC   Urine retention - resolved Tamsulosin on discharge   Scalp laceration Follow outpatient   Hx of PE/DVT and intracranial venous thrombosis Resume home on Eliquis   SLE Stable holding [employer]  steroid 10 mg daily   overweight based on BMI: Body mass index is 29.1 kg/m.  Underweight - under 18.5  normal weight - 18.5 to 24.9 overweight - 25 to 29.9 obese - 30 or more        Discharge Instructions  Allergies  as of 11/13/2023       Reactions   Enalapril Other (See Comments)   Unknown reaction   Vicodin [hydrocodone-acetaminophen] Hives, Rash   Severe headaches (also) NAME BRAND ONLY PER PT CAN TAKE GENERIC        Medication List     STOP taking these medications    LORazepam 0.5 MG tablet Commonly known as: ATIVAN   predniSONE 10 MG tablet Commonly known as: DELTASONE   traMADol 50 MG tablet Commonly known as: ULTRAM       TAKE these medications    albuterol 108 (90 Base) MCG/ACT inhaler Commonly known as: VENTOLIN HFA Inhale 2 puffs into the lungs every 6 (six) hours as needed for wheezing or shortness of breath.   amLODipine 5 MG tablet Commonly known as: NORVASC Take 1 tablet (5 mg total) by mouth daily.   apixaban 2.5 MG Tabs tablet Commonly known as: ELIQUIS Take 1 tablet (2.5 mg total) by mouth 2 (two) times daily. What changed:  medication strength how much to take   ARIPiprazole 2 MG tablet Commonly known as: ABILIFY Take 2 mg by mouth daily.   cholecalciferol 25 MCG (1000 UNIT) tablet Commonly  known as: VITAMIN D3 Take 1,000 Units by mouth daily.   clonazePAM 0.5 MG tablet Commonly known as: KLONOPIN Take 0.5 mg by mouth daily.   clopidogrel 75 MG tablet Commonly known as: PLAVIX Take 1 tablet (75 mg total) by mouth daily. Start taking on: November 14, 2023   gabapentin 100 MG capsule Commonly known as: NEURONTIN Take 2 capsules (200 mg total) by mouth 3 (three) times daily. What changed:  medication strength how much to take   hydrOXYzine 25 MG tablet Commonly known as: ATARAX Take 1 tablet (25 mg total) by mouth 2 (two) times daily as needed for anxiety, itching or nausea. What changed:  when to take this reasons to take this   levocetirizine 5 MG tablet Commonly known as: XYZAL Take 1 tablet (5 mg total) by mouth every evening.   lidocaine 5 % Commonly known as: LIDODERM Place 2 patches onto the skin daily. Remove & Discard  patch within 12 hours or as directed by MD Start taking on: November 14, 2023   losartan 25 MG tablet Commonly known as: COZAAR Take 1 tablet (25 mg total) by mouth daily. Start taking on: November 14, 2023 What changed:  medication strength how much to take   methocarbamol 500 MG tablet Commonly known as: ROBAXIN Take 1 tablet (500 mg total) by mouth every 8 (eight) hours as needed for muscle spasms.   metoprolol succinate 25 MG 24 hr tablet Commonly known as: TOPROL-XL Take 0.5 tablets (12.5 mg total) by mouth daily. Start taking on: November 14, 2023   montelukast 10 MG tablet Commonly known as: SINGULAIR Take 1 tablet (10 mg total) by mouth at bedtime.   multivitamin with minerals tablet Take 1 tablet by mouth daily.   mycophenolate 500 MG tablet Commonly known as: CELLCEPT Take 2 tablets (1,000 mg total) by mouth 2 (two) times daily. HOLD until neurosurgery is ok to restart this (waiting for incision to heal) What changed: additional instructions   oxyCODONE 5 MG immediate release tablet Commonly known as: Oxy IR/ROXICODONE Take 1-2 tablets (5-10 mg total) by mouth every 6 (six) hours as needed for moderate pain (pain score 4-6) or severe pain (pain score 7-10).   rosuvastatin 20 MG tablet Commonly known as: CRESTOR Take 1 tablet (20 mg total) by mouth daily. Start taking on: November 14, 2023 What changed:  medication strength how much to take   senna 8.6 MG Tabs tablet Commonly known as: SENOKOT Take 2 tablets (17.2 mg total) by mouth at bedtime. Stop if loose stool   sertraline 100 MG tablet Commonly known as: ZOLOFT Take 1.5 tablets (150 mg total) by mouth daily.   tadalafil 20 MG tablet Commonly known as: CIALIS Take 1 tablet (20 mg total) by mouth daily as needed for erectile dysfunction.   tamsulosin 0.4 MG Caps capsule Commonly known as: FLOMAX Take 1 capsule (0.4 mg total) by mouth daily after supper.               Discharge Care  Instructions  (From admission, onward)           Start     Ordered   11/13/23 0000  Discharge wound care:       Comments: Cleanse irritation around staples with NS and pat dry. Apply xeroform to irritation (not over staples) and cover entire area with silicone foam. Change daily.   11/13/23 1331             Contact information for follow-up providers  Joan Flores, PA-C Follow up on 11/23/2023.   Specialty: Physician Assistant Contact information: [address] [place], [address] [name] [address] [phone]         Yvonne Kendall, MD. Schedule an appointment as soon as possible for a visit.   Specialty: Cardiology Why: follow up hospitalization, STEMI Contact information: [address] [name] [address] [phone]         Venetia Night, MD. Schedule an appointment as soon as possible for a visit.   Specialty: Neurosurgery Why: follw up hospitalization T10 fracture / fixation, For wound re-check and check on fracture/therapy progress Contact information: [address] [name] [address] [phone]              Contact information for after-discharge care     Destination     [facility] .   Service: Skilled Nursing Contact information: [address] [place] [address] [phone]                     Allergies  Allergen Reactions   Enalapril Other (See Comments)    Unknown reaction   Vicodin [Hydrocodone-Acetaminophen] Hives and Rash    Severe headaches (also) NAME BRAND ONLY PER PT CAN TAKE GENERIC     Subjective: pt reports feeling well today, energetic, does not want to go to rehab and thankfully much stronger today tha he's been and able to use arm now that NWB restrictions lifted/ No CP/SOB. Back pain is controlled,    Discharge Exam: BP 121/63 (BP Location: Left Arm)   Pulse 75   Temp (!) 97.5 F (36.4 C)  (Oral)   Resp 15   Ht 6\' 2"  (1.88 m)   Wt 102.8 kg   SpO2 96%   BMI 29.10 kg/m  General: Pt is alert, awake, not in acute distress Cardiovascular: RRR, S1/S2 +, no rubs, no gallops Respiratory: CTA bilaterally, no wheezing, no rhonchi Abdominal: Soft, NT, ND, bowel sounds + Extremities: no edema, no cyanosis     The results of significant diagnostics from this hospitalization (including imaging, microbiology, ancillary and laboratory) are listed below for reference.     Microbiology: Recent Results (from the past 240 hour(s))  MRSA Next Gen by PCR, Nasal     Status: None   Collection Time: 11/05/23  1:12 PM   Specimen: Nasal Mucosa; Nasal Swab  Result Value Ref Range Status   MRSA by PCR Next Gen NOT DETECTED NOT DETECTED Final    Comment: (NOTE) The GeneXpert MRSA Assay (FDA approved for NASAL specimens only), is one component of a comprehensive MRSA colonization surveillance program. It is not intended to diagnose MRSA infection nor to guide or monitor treatment for MRSA infections. Test performance is not FDA approved in patients less than 57 years old. Performed at [facility], [address] [place], [name] [address]      Labs: BNP (last 3 results) No results for input(s): "BNP" in the last 8760 hours. Basic Metabolic Panel: Recent Labs  Lab 11/08/23 0403 11/09/23 0410 11/10/23 0636 11/11/23 0616 11/13/23 0417  NA 135 135 138 140 137  K 4.6 4.0 3.9 4.0 3.7  CL 106 107 109 112* 105  CO2 22 23 21* 21* 22  GLUCOSE 108* 110* 97 96 101*  BUN 45* 47* 36* 30* 26*  CREATININE 3.31* 2.25* 1.36* 1.19 1.39*  CALCIUM 7.7* 7.8* 8.2* 8.7* 8.3*   Liver Function Tests: No  results for input(s): "AST", "ALT", "ALKPHOS", "BILITOT", "PROT", "ALBUMIN" in the last 168 hours. No results for input(s): "LIPASE", "AMYLASE" in the last 168 hours. No results for input(s): "AMMONIA" in the last 168 hours. CBC: Recent Labs  Lab 11/08/23 0403 11/09/23 0410  11/10/23 0636 11/11/23 0616 11/13/23 0417  WBC 7.6 5.6 4.6 5.8 5.5  HGB 10.5* 10.0* 10.2* 10.4* 10.3*  HCT 33.8* 31.5* 31.7* 31.9* 31.7*  MCV 94.9 93.5 91.9 89.1 91.1  PLT 102* 101* 122* 149* 165   Cardiac Enzymes: No results for input(s): "CKTOTAL", "CKMB", "CKMBINDEX", "TROPONINI" in the last 168 hours. BNP: Invalid input(s): "POCBNP" CBG: No results for input(s): "GLUCAP" in the last 168 hours. D-Dimer No results for input(s): "DDIMER" in the last 72 hours. Hgb A1c No results for input(s): "HGBA1C" in the last 72 hours. Lipid Profile No results for input(s): "CHOL", "HDL", "LDLCALC", "TRIG", "CHOLHDL", "LDLDIRECT" in the last 72 hours. Thyroid function studies No results for input(s): "TSH", "T4TOTAL", "T3FREE", "THYROIDAB" in the last 72 hours.  Invalid input(s): "FREET3" Anemia work up No results for input(s): "VITAMINB12", "FOLATE", "FERRITIN", "TIBC", "IRON", "RETICCTPCT" in the last 72 hours. Urinalysis    Component Value Date/Time   COLORURINE YELLOW (A) 11/08/2023 1204   APPEARANCEUR HAZY (A) 11/08/2023 1204   APPEARANCEUR Clear 03/25/2023 0833   LABSPEC 1.017 11/08/2023 1204   LABSPEC 1.013 04/10/2015 1036   PHURINE 5.0 11/08/2023 1204   GLUCOSEU NEGATIVE 11/08/2023 1204   GLUCOSEU Negative 04/10/2015 1036   HGBUR LARGE (A) 11/08/2023 1204   BILIRUBINUR NEGATIVE 11/08/2023 1204   BILIRUBINUR Negative 03/25/2023 0833   BILIRUBINUR Negative 04/10/2015 1036   KETONESUR NEGATIVE 11/08/2023 1204   PROTEINUR NEGATIVE 11/08/2023 1204   UROBILINOGEN 0.2 07/23/2021 1423   UROBILINOGEN 0.2 03/17/2015 0025   NITRITE NEGATIVE 11/08/2023 1204   LEUKOCYTESUR SMALL (A) 11/08/2023 1204   LEUKOCYTESUR Negative 04/10/2015 1036   Sepsis Labs Recent Labs  Lab 11/09/23 0410 11/10/23 0636 11/11/23 0616 11/13/23 0417  WBC 5.6 4.6 5.8 5.5   Microbiology Recent Results (from the past 240 hour(s))  MRSA Next Gen by PCR, Nasal     Status: None   Collection Time:  11/05/23  1:12 PM   Specimen: Nasal Mucosa; Nasal Swab  Result Value Ref Range Status   MRSA by PCR Next Gen NOT DETECTED NOT DETECTED Final    Comment: (NOTE) The GeneXpert MRSA Assay (FDA approved for NASAL specimens only), is one component of a comprehensive MRSA colonization surveillance program. It is not intended to diagnose MRSA infection nor to guide or monitor treatment for MRSA infections. Test performance is not FDA approved in patients less than 71 years old. Performed at [facility], [address] [place], [name] [address]    Imaging ECHOCARDIOGRAM COMPLETE  Result Date: 11/06/2023    ECHOCARDIOGRAM REPORT   Patient Name:   Jonathon Snow Date of Exam: 11/06/2023 Medical Rec #:  [id number]      Height:       74.0 in Accession #:    [id number]     Weight:       226.6 lb Date of Birth:  1961-01-18     BSA:          2.292 m Patient Age:    61 years       BP:           98/58 mmHg Patient Gender: M              HR:  83 bpm. Exam Location:  [facility] Procedure: 2D Echo, Cardiac Doppler and Color Doppler Indications:     Syncope  History:         Patient has prior history of Echocardiogram examinations, most                  recent 09/10/2021. CAD and Acute MI, COPD,                  Signs/Symptoms:Syncope and Dyspnea; Risk Factors:Hypertension,                  Diabetes and Dyslipidemia. Pulmonary embolus.  Sonographer:     Mikki Harbor Referring Phys:  [phone] CHRISTOPHER END Diagnosing Phys: Debbe Odea MD  Sonographer Comments: Technically difficult study due to poor echo windows, suboptimal subcostal window and suboptimal parasternal window. Image acquisition challenging due to respiratory motion. IMPRESSIONS  1. Left ventricular ejection fraction, by estimation, is 55 to 60%. The left ventricle has normal function. The left ventricle has no regional wall motion abnormalities. There is mild left ventricular hypertrophy. Left ventricular diastolic parameters are  consistent with Grade I diastolic dysfunction (impaired relaxation).  2. Right ventricular systolic function is normal. The right ventricular size is mildly enlarged. There is mildly elevated pulmonary artery systolic pressure.  3. The mitral valve is normal in structure. No evidence of mitral valve regurgitation.  4. The aortic valve is grossly normal. Aortic valve regurgitation is not visualized. FINDINGS  Left Ventricle: Left ventricular ejection fraction, by estimation, is 55 to 60%. The left ventricle has normal function. The left ventricle has no regional wall motion abnormalities. The left ventricular internal cavity size was normal in size. There is  mild left ventricular hypertrophy. Left ventricular diastolic parameters are consistent with Grade I diastolic dysfunction (impaired relaxation). Right Ventricle: The right ventricular size is mildly enlarged. No increase in right ventricular wall thickness. Right ventricular systolic function is normal. There is mildly elevated pulmonary artery systolic pressure. The tricuspid regurgitant velocity is 2.66 m/s, and with an assumed right atrial pressure of 8 mmHg, the estimated right ventricular systolic pressure is 36.3 mmHg. Left Atrium: Left atrial size was normal in size. Right Atrium: Right atrial size was normal in size. Pericardium: There is no evidence of pericardial effusion. Mitral Valve: The mitral valve is normal in structure. No evidence of mitral valve regurgitation. MV peak gradient, 5.7 mmHg. The mean mitral valve gradient is 2.0 mmHg. Tricuspid Valve: The tricuspid valve is normal in structure. Tricuspid valve regurgitation is trivial. Aortic Valve: The aortic valve is grossly normal. Aortic valve regurgitation is not visualized. Aortic valve mean gradient measures 8.0 mmHg. Aortic valve peak gradient measures 11.0 mmHg. Aortic valve area, by VTI measures 1.97 cm. Pulmonic Valve: The pulmonic valve was not well visualized. Pulmonic valve  regurgitation is not visualized. Aorta: The aortic root is normal in size and structure. Venous: The inferior vena cava was not well visualized. IAS/Shunts: No atrial level shunt detected by color flow Doppler.  LEFT VENTRICLE PLAX 2D LVIDd:         4.10 cm   Diastology LVIDs:         3.00 cm   LV e' medial:    9.20 cm/s LV PW:         1.30 cm   LV E/e' medial:  9.8 LV IVS:        1.30 cm   LV e' lateral:   7.94 cm/s LVOT diam:     2.00  cm   LV E/e' lateral: 11.3 LV SV:         69 LV SV Index:   30 LVOT Area:     3.14 cm  RIGHT VENTRICLE RV Basal diam:  3.85 cm RV Mid diam:    3.70 cm RV S prime:     21.80 cm/s TAPSE (M-mode): 2.3 cm LEFT ATRIUM             Index        RIGHT ATRIUM           Index LA diam:        3.30 cm 1.44 cm/m   RA Area:     16.60 cm LA Vol (A2C):   47.8 ml 20.85 ml/m  RA Volume:   49.50 ml  21.60 ml/m LA Vol (A4C):   87.8 ml 38.31 ml/m LA Biplane Vol: 66.6 ml 29.06 ml/m  AORTIC VALVE                     PULMONIC VALVE AV Area (Vmax):    1.99 cm      PV Vmax:       0.96 m/s AV Area (Vmean):   1.64 cm      PV Peak grad:  3.7 mmHg AV Area (VTI):     1.97 cm AV Vmax:           166.00 cm/s AV Vmean:          131.000 cm/s AV VTI:            0.350 m AV Peak Grad:      11.0 mmHg AV Mean Grad:      8.0 mmHg LVOT Vmax:         105.00 cm/s LVOT Vmean:        68.500 cm/s LVOT VTI:          0.219 m LVOT/AV VTI ratio: 0.63  AORTA Ao Root diam: 3.40 cm MITRAL VALVE                TRICUSPID VALVE MV Area (PHT): 4.65 cm     TR Peak grad:   28.3 mmHg MV Area VTI:   2.85 cm     TR Vmax:        266.00 cm/s MV Peak grad:  5.7 mmHg MV Mean grad:  2.0 mmHg     SHUNTS MV Vmax:       1.19 m/s     Systemic VTI:  0.22 m MV Vmean:      67.3 cm/s    Systemic Diam: 2.00 cm MV Decel Time: 163 msec MV E velocity: 90.10 cm/s MV A velocity: 130.00 cm/s MV E/A ratio:  0.69 Debbe Odea MD Electronically signed by Debbe Odea MD Signature Date/Time: 11/06/2023/5:40:51 PM    Final    DG Thoracic Spine 2  View  Result Date: 11/06/2023 CLINICAL DATA:  Elective surgery. EXAM: THORACIC SPINE 2 VIEWS COMPARISON:  Thoracic spine MRI yesterday FINDINGS: Two fluoroscopic spot views of the thoracic spine as well as intraoperative CT imaging obtained during multilevel thoracic spine fusion. Fluoroscopy time 1 minute 6 seconds. Dose 85.785 mGy. IMPRESSION: Intraoperative fluoroscopy during thoracic spine fusion. Electronically Signed   By: Narda Rutherford M.D.   On: 11/06/2023 16:30   DG C-Arm 1-60 Min-No Report  Result Date: 11/06/2023 Fluoroscopy was utilized by the requesting physician.  No radiographic interpretation.   CT ANGIO CHEST AORTA W/CM &/OR WO/CM  Result Date: 11/06/2023  CLINICAL DATA:  Chest trauma, posterior mediastinal stranding on CT abdomen/pelvis EXAM: CT ANGIOGRAPHY CHEST WITH CONTRAST TECHNIQUE: Multidetector CT imaging of the chest was performed using the standard protocol during bolus administration of intravenous contrast. Multiplanar CT image reconstructions and MIPs were obtained to evaluate the vascular anatomy. RADIATION DOSE REDUCTION: This exam was performed according to the departmental dose-optimization program which includes automated exposure control, adjustment of the mA and/or kV according to patient size and/or use of iterative reconstruction technique. CONTRAST:  OMNIPAQUE IOHEXOL 350 MG/ML SOLN COMPARISON:  MRI thoracic spine dated 11/05/2023. CT abdomen/pelvis dated 11142 than 24. FINDINGS: Cardiovascular: On unenhanced CT, there is no evidence of intramural hematoma. Following contrast administration, there is no evidence of traumatic aortic injury. Mild atherosclerotic calcifications of the aortic arch. Although not tailored for evaluation of the pulmonary arteries, there is no evidence of central pulmonary embolism to the lobar level. The heart is normal in size.  No pericardial effusion. Moderate three-vessel coronary atherosclerosis. Mediastinum/Nodes: No  evidence of anterior mediastinal hematoma. No suspicious mediastinal lymphadenopathy. Mild prevertebral stranding/hemorrhage at T10 (series 4/image 46), related to the vertebral body fracture at this level, better evaluated on MR. Visualized thyroid is unremarkable. Lungs/Pleura: Mild dependent atelectasis in the bilateral lower lobes. No focal consolidation or aspiration. No suspicious pulmonary nodules. No pleural effusion or pneumothorax. Upper Abdomen: Evaluated on recent CT abdomen/pelvis. Musculoskeletal: Dedicated thoracic spine evaluation (with T10 fracture) is better evaluated on recent MR thoracic spine. Otherwise, no fracture is seen. Review of the MIP images confirms the above findings. IMPRESSION: T10 vertebral body fracture, better evaluated on recent MRI thoracic spine. Associated mild prevertebral stranding/hemorrhage at this level, corresponding to the finding on recent CT abdomen/pelvis. No evidence of traumatic aortic injury. Aortic Atherosclerosis (ICD10-I70.0). Electronically Signed   By: Charline Bills M.D.   On: 11/06/2023 00:55      Time coordinating discharge: over 30 minutes  SIGNED:  Sunnie Nielsen DO [facility]

## 2023-11-13 NOTE — Progress Notes (Signed)
Physical Therapy Treatment Patient Details Name: Jonathon Snow MRN: [id number] DOB: 1961/03/31 Today's Date: 11/13/2023   History of Present Illness Pt is a 62 yo male that presented with sudden lightheadness and syncope, admitted with new inferior ST segment elevation, underwent emergent cardiac cath and PCI, also s/p  open reduction and internal fixation of T10 fracture, posterior lateral arthrodesis T7-T12. PMH of COPD, HTN, CAD, past MI, PVD, DVT, cerbral venous sinus thrombosis,  lupus, lymphedema, PE, renal disorder stage III, back surgery    PT Comments  Pt was long sitting in bed upon arrival. He is alert and oriented + agreeable to session. BP at rest 109/76 prior to OOB activity. No c/o dizziness sitting EOB x 4 minutes. Sitting EOB BP 123/78. Author clarified wt bearing orders on RUE. Pt is now WBAT. Overall pt demonstrated much improved abilities and safety with all activity. He tolerated log roll R to short sit with supervision only. Stood to [employer] and ambulated 200 ft without LOB. HR stayed in upper 90s during gait training. Pt also able to safely perform ascending/descending step to simulate home entry. Pt is now considering Dcing straight home from acute hospital. Daughter is planning to come visit today to discuss DC disposition with pt + TOC/MD. DC recs updated to reflect pt's improvement in abilities/ safety.    If plan is discharge home, recommend the following: A little help with walking and/or transfers;A little help with bathing/dressing/bathroom;Assistance with cooking/housework;Direct supervision/assist for medications management;Direct supervision/assist for financial management;Assist for transportation;Help with stairs or ramp for entrance     Equipment Recommendations  None recommended by PT (pt endorses having all equipment needs met)       Precautions / Restrictions Precautions Precautions: Back;Fall;Other (comment) Precaution Comments: Clarified with MD NWB  restrictions on RUE. Per MD" Pt is now WBAT RUE." Required Braces or Orthoses: Other Brace (TLSO) Restrictions Weight Bearing Restrictions: Yes RUE Weight Bearing: Weight bearing as tolerated LUE Weight Bearing: Weight bearing as tolerated     Mobility  Bed Mobility Overal bed mobility: Needs Assistance Bed Mobility: Rolling, Sidelying to Sit, Sit to Sidelying, Sit to Supine Rolling: Supervision Sidelying to sit: Supervision, Used rails Supine to sit: Supervision, Used rails  General bed mobility comments: pt was able to roll R to short sit without physical assistance. did use bed rail    Transfers Overall transfer level: Needs assistance Equipment used: Rolling walker (2 wheels) Transfers: Sit to/from Stand Sit to Stand: Contact guard assist  General transfer comment: CGA with first STS but supervision once able to use RUE (WBAT)    Ambulation/Gait Ambulation/Gait assistance: Contact guard assist, +2 safety/equipment Gait Distance (Feet): 200 Feet Assistive device: Right platform walker, Rolling walker (2 wheels) Gait Pattern/deviations: Step-through pattern Gait velocity: decreased  General Gait Details: Pt was able to ambulate 200 ft with RW without LOB or unsteadiness. TLSO was applied at EOB. pt required min assist   Stairs Stairs: Yes Stairs assistance: Supervision Stair Management: No rails, Step to pattern, Backwards, Forwards, With walker Number of Stairs: 1 General stair comments: Pt endorses having one step/threshold to enter home. easily and safely able to perform 1 step with ascending backwards with RW and fwd descending.    Balance Overall balance assessment: Needs assistance Sitting-balance support: Feet supported, No upper extremity supported Sitting balance-Leahy Scale: Good     Standing balance support: Bilateral upper extremity supported, During functional activity, Reliant on assistive device for balance Standing balance-Leahy Scale: Good  Cognition Arousal: Alert Behavior During Therapy: WFL for tasks assessed/performed Overall Cognitive Status: Within Functional Limits for tasks assessed    General Comments: Pt is A and O and agreeable to session           General Comments General comments (skin integrity, edema, etc.): discussed at length DC disposiiton. " I dont want to have to go to rehab. I want to go home. I'm gonna talk o my daughter later today." MD/TOC made aware      Pertinent Vitals/Pain Pain Assessment Pain Assessment: 0-10 Pain Score: 4  Pain Location: back pain Pain Descriptors / Indicators: Aching, Moaning, Sharp, Guarding, Grimacing Pain Intervention(s): Limited activity within patient's tolerance, Monitored during session, Premedicated before session, Repositioned     PT Goals (current goals can now be found in the care plan section) Acute Rehab PT Goals Patient Stated Goal: go home Progress towards PT goals: Progressing toward goals    Frequency    7X/week           Co-evaluation     PT goals addressed during session: Mobility/safety with mobility;Balance;Proper use of DME;Strengthening/ROM        AM-PAC PT "6 Clicks" Mobility   Outcome Measure  Help needed turning from your back to your side while in a flat bed without using bedrails?: A Little Help needed moving from lying on your back to sitting on the side of a flat bed without using bedrails?: A Little Help needed moving to and from a bed to a chair (including a wheelchair)?: A Little Help needed standing up from a chair using your arms (e.g., wheelchair or bedside chair)?: A Little Help needed to walk in hospital room?: A Little Help needed climbing 3-5 steps with a railing? : A Little 6 Click Score: 18    End of Session Equipment Utilized During Treatment: Back brace Activity Tolerance: Patient tolerated treatment well Patient left: in chair;with call bell/phone within reach;with chair alarm set Nurse  Communication: Mobility status PT Visit Diagnosis: Difficulty in walking, not elsewhere classified (R26.2);Other abnormalities of gait and mobility (R26.89);Muscle weakness (generalized) (M62.81);Pain     Time: 2841-3244 PT Time Calculation (min) (ACUTE ONLY): 35 min  Charges:    $Gait Training: 8-22 mins $Therapeutic Activity: 8-22 mins PT General Charges $$ ACUTE PT VISIT: 1 Visit                    Jetta Lout PTA 11/13/23, 9:06 AM

## 2023-11-13 NOTE — Progress Notes (Signed)
     [facility] REFERRAL        Occupational Therapy * Physical Therapy * Speech Therapy                      DATE 11/13/23  PATIENT NAME Jonathon Snow  PATIENT MRN [id number] DIAGNOSIS/DIAGNOSIS CODE I21.3 DATE OF DISCHARGE: 11/22  PRIMARY CARE PHYSICIAN: Danelle Berry, PA     PCP PHONE/FAX [phone]    Dear Provider (Name: [facility] outpatient __  Fax: [phone]   I certify that I have examined this patient and that occupational/physical/speech therapy is necessary on an outpatient basis.    The patient has expressed interest in completing their recommended course of therapy at your  location.  Once a formal order from the patient's primary care physician has been obtained, please  contact him/her to schedule an appointment for evaluation at your earliest convenience.   [ X]  Physical Therapy Evaluate and Treat          The patient's primary care physician (listed above) must furnish and be responsible for a formal order such that the recommended services may be furnished while under the primary physician's care, and that the plan of care will be established and reviewed every 30 days (or more often if condition necessitates).    Darolyn Rua, [place], MSW, [place] [phone]

## 2023-11-13 NOTE — Progress Notes (Signed)
[facility] [place], [name] 11/13/23  Subjective:   Hospital day # 8  Patient known to our practice from office follow-up for lupus nephritis.  Last seen in October 2024.  He presented via EMS from home with code STEMI.  Per notes, he was cooking outside and felt dizzy and had a syncopal episode.  He sustained a laceration to the posterior part of his head. He underwent emergent coronary angiography and was found to have multivessel coronary artery disease.  He underwent PCI to left circumflex.  CT scan of the abdomen and pelvis showed thoracic spine fracture, left lower anterior abdominal wall subcutaneous hematoma.  He then underwent CT angiography of the chest which confirmed T10 vertebral body fracture with associated mild prevertebral stranding and hemorrhage.    He was evaluated by neurosurgery..  Because of various thoracic spine injuries, he is deemed to be extremely high risk for spinal cord injury.  T7-T12 fixation of the fracture and fusion of the thoracic spine was recommended.Anticoagulation with apixaban and aspirin and clopidogrel is on hold.  Patient underwent successful open reduction internal fixation of T10 fracture, posterior lateral arthrodesis T7-T12 on 11/06/2023.  Unfortunately patient has developed acute kidney injury.  His creatinine has increased from Baseline 1.35/GFR 60 on 09/29/2023   Update: Patient seen and evaluated at bedside.  Sitting up in chair at bedside.  Wearing chest brace. Able to eat without nausea or vomiting.   Objective:  Vital signs in last 24 hours:  Temp:  [98.1 F (36.7 C)-98.4 F (36.9 C)] 98.1 F (36.7 C) (11/22 0846) Pulse Rate:  [71-84] 75 (11/22 0846) Resp:  [16-18] 16 (11/22 0846) BP: (98-146)/(61-77) 101/61 (11/22 0846) SpO2:  [92 %-95 %] 95 % (11/22 0846)  Weight change:  Filed Weights   11/05/23 1016 11/05/23 1301  Weight: 107.9 kg 102.8 kg    Intake/Output:    Intake/Output Summary (Last 24  hours) at 11/13/2023 1142 Last data filed at 11/13/2023 1041 Gross per 24 hour  Intake 240 ml  Output 500 ml  Net -260 ml     Physical Exam: General: No acute distress,   HEENT Moist oral mucous membranes  Pulm/lungs Normal breathing effort  CVS/Heart S1S2 no rubs  Abdomen:  Soft, nontender  Extremities: No edema  Neurologic: Alert, oriented  Skin: Left groin hematoma, right forearm hematoma          Basic Metabolic Panel:  Recent Labs  Lab 11/08/23 0403 11/09/23 0410 11/10/23 0636 11/11/23 0616 11/13/23 0417  NA 135 135 138 140 137  K 4.6 4.0 3.9 4.0 3.7  CL 106 107 109 112* 105  CO2 22 23 21* 21* 22  GLUCOSE 108* 110* 97 96 101*  BUN 45* 47* 36* 30* 26*  CREATININE 3.31* 2.25* 1.36* 1.19 1.39*  CALCIUM 7.7* 7.8* 8.2* 8.7* 8.3*     CBC: Recent Labs  Lab 11/08/23 0403 11/09/23 0410 11/10/23 0636 11/11/23 0616 11/13/23 0417  WBC 7.6 5.6 4.6 5.8 5.5  HGB 10.5* 10.0* 10.2* 10.4* 10.3*  HCT 33.8* 31.5* 31.7* 31.9* 31.7*  MCV 94.9 93.5 91.9 89.1 91.1  PLT 102* 101* 122* 149* 165     No results found for: "HEPBSAG", "HEPBSAB", "HEPBIGM"    Microbiology:  Recent Results (from the past 240 hour(s))  MRSA Next Gen by PCR, Nasal     Status: None   Collection Time: 11/05/23  1:12 PM   Specimen: Nasal Mucosa; Nasal Swab  Result Value Ref Range Status   MRSA by  PCR Next Gen NOT DETECTED NOT DETECTED Final    Comment: (NOTE) The GeneXpert MRSA Assay (FDA approved for NASAL specimens only), is one component of a comprehensive MRSA colonization surveillance program. It is not intended to diagnose MRSA infection nor to guide or monitor treatment for MRSA infections. Test performance is not FDA approved in patients less than 40 years old. Performed at [facility], [address] [place], [name] [address]     Coagulation Studies: No results for input(s): "LABPROT", "INR" in the last 72 hours.   Urinalysis: No results for input(s):  "COLORURINE", "LABSPEC", "PHURINE", "GLUCOSEU", "HGBUR", "BILIRUBINUR", "KETONESUR", "PROTEINUR", "UROBILINOGEN", "NITRITE", "LEUKOCYTESUR" in the last 72 hours.  Invalid input(s): "APPERANCEUR"     Imaging: No results found.   Medications:      acetaminophen  1,000 mg Oral Q6H   amLODipine  10 mg Oral Daily   apixaban  2.5 mg Oral BID   ARIPiprazole  2 mg Oral Daily   clopidogrel  75 mg Oral Daily   docusate sodium  100 mg Oral BID   gabapentin  200 mg Oral TID   lidocaine  2 patch Transdermal Q24H   loratadine  5 mg Oral QPM   losartan  25 mg Oral Daily   metoprolol succinate  12.5 mg Oral Daily   montelukast  10 mg Oral QHS   polyethylene glycol  17 g Oral Daily   rosuvastatin  20 mg Oral Daily   senna  1 tablet Oral BID   sertraline  150 mg Oral Daily   sodium chloride flush  3 mL Intravenous Q12H   tamsulosin  0.4 mg Oral QPC supper   albuterol, famotidine, LORazepam, magnesium citrate, methocarbamol **OR** methocarbamol (ROBAXIN) injection, ondansetron (ZOFRAN) IV, mouth rinse, oxyCODONE, oxyCODONE, sodium chloride flush, sorbitol  Assessment/ Plan:  62 y.o. male with lupus nephritis, hypertension, mild CKD, lymphedema, secondary hyperparathyroidism, history of lumbar herpes zoster infection, history of pulmonary embolus with IVC filter placement, right ankle fracture status post ORIF, herniated disc status post L5-S1 fusion history of right axillary and left groin abscess in May 2018    admitted on 11/05/2023 for STEMI (ST elevation myocardial infarction) [facility]) [I21.3]  Hospital course is complicated by Scalp laceration, left circumflex PCI with stent placement, thoracic spine fracture status post T7-T12 fixation and thoracic spine fusion by neurosurgery this admission, acute kidney injury  1.  Acute kidney injury on chronic kidney disease stage 3a with underlying lupus nephritis, hypotension.  Lupus nephritis has been managed with CellCept (1000 mg twice a day),  hydroxychloroquine 200 mg p.o. daily, losartan 100 mg p.o. daily, prednisone 10 mg daily as outpatient. Most recent outpatient labs-09/29/2023-creatinine 1.36, GFR 59 AKI likely secondary to hypotension and multiple IV contrast exposures. Update: Renal function improved to his baseline.  Creatinine down to 1.2-1.4.  Continue supportive care.  Okay to hold CellCept until we reevaluate him as an outpatient.  2.  Anemia of chronic kidney disease.   Lab Results  Component Value Date   HGB 10.3 (L) 11/13/2023  Hemoglobin up slightly to 10.4.  No immediate need for Epogen.  Continue to monitor as outpatient.     LOS: 8 Yerlin Gasparyan Thedore Mins 11/22/202411:42 AM  [facility] [place], [name] [phone]  Note: This note was prepared with Dragon dictation. Any transcription errors are unintentional

## 2023-11-13 NOTE — Telephone Encounter (Signed)
Spoke with patient and made him aware that a heart monitor has been ordered and going to his address on file. He verbalized understanding with no further questions at this time.

## 2023-11-13 NOTE — Telephone Encounter (Signed)
Duplicate encounter

## 2023-11-13 NOTE — Telephone Encounter (Signed)
-----   Message from [facility] sent at 11/13/2023 11:59 AM EST ----- Please have him a ZIO AT monitor for 14 days mailed for syncope. He is pending discharge today.  Thank you, Lavonna Rua

## 2023-11-13 NOTE — Progress Notes (Signed)
Occupational Therapy Treatment Patient Details Name: Jonathon Snow MRN: [id number] DOB: 04/14/1961 Today's Date: 11/13/2023   History of present illness Pt is a 62 yo male that presented with sudden lightheadness and syncope, admitted with new inferior ST segment elevation, underwent emergent cardiac cath and PCI, also s/p  open reduction and internal fixation of T10 fracture, posterior lateral arthrodesis T7-T12. PMH of COPD, HTN, CAD, past MI, PVD, DVT, cerbral venous sinus thrombosis,  lupus, lymphedema, PE, renal disorder stage III, back surgery   OT comments  Pt seen for OT treatment on this date. Upon arrival to room pt supine in bed, agreeable to tx. Pt completed all bed mobility with use of rails into sitting at the EOB with supervision. Pt completed putting TLSO brace with supervision. Pt completed donning/doffing socks utilizing figure four technique with supervision. Pt returned to supine and discussed with OT with preference to go home and no concerns at this time. Pt left supine in bed with call bell within reach and all needs met. Pt making good progress toward goals, will continue to follow POC. Discharge recommendation remains appropriate.        If plan is discharge home, recommend the following:  Two people to help with walking and/or transfers;Assistance with cooking/housework;Help with stairs or ramp for entrance;Assist for transportation;Two people to help with bathing/dressing/bathroom   Equipment Recommendations  BSC/3in1    Recommendations for Other Services      Precautions / Restrictions Precautions Precautions: Back;Fall;Other (comment) Required Braces or Orthoses: Other Brace (TLSO) Restrictions Weight Bearing Restrictions: Yes RUE Weight Bearing: Weight bearing as tolerated LUE Weight Bearing: Weight bearing as tolerated       Mobility Bed Mobility Overal bed mobility: Needs Assistance   Rolling: Supervision           Patient Response:  Cooperative  Transfers                         Balance Overall balance assessment: Needs assistance Sitting-balance support: Feet supported, No upper extremity supported Sitting balance-Leahy Scale: Good                                     ADL either performed or assessed with clinical judgement   ADL Overall ADL's : Needs assistance/impaired                                     Functional mobility during ADLs: Supervision/safety General ADL Comments: Pt completed putting TLSO brace with supervision and MIN A for the straps. Pt completed donning/doffing socks utilizing figure four technique with supervision.    Extremity/Trunk Assessment Upper Extremity Assessment Upper Extremity Assessment: Overall WFL for tasks assessed   Lower Extremity Assessment Lower Extremity Assessment: Defer to PT evaluation   Cervical / Trunk Assessment Cervical / Trunk Assessment: Back Surgery    Vision Patient Visual Report: No change from baseline     Perception     Praxis      Cognition Arousal: Alert Behavior During Therapy: WFL for tasks assessed/performed Overall Cognitive Status: Within Functional Limits for tasks assessed                                 General Comments: Pt is A and O  and agreeable to session        Exercises      Shoulder Instructions       General Comments      Pertinent Vitals/ Pain       Pain Assessment Pain Assessment: No/denies pain  Home Living                                          Prior Functioning/Environment              Frequency  Min 1X/week        Progress Toward Goals  OT Goals(current goals can now be found in the care plan section)  Progress towards OT goals: Progressing toward goals     Plan      Co-evaluation                 AM-PAC OT "6 Clicks" Daily Activity     Outcome Measure   Help from another person eating meals?:  None Help from another person taking care of personal grooming?: A Little Help from another person toileting, which includes using toliet, bedpan, or urinal?: A Little Help from another person bathing (including washing, rinsing, drying)?: A Lot Help from another person to put on and taking off regular upper body clothing?: None Help from another person to put on and taking off regular lower body clothing?: A Little 6 Click Score: 19    End of Session Equipment Utilized During Treatment: Back brace;Rolling walker (2 wheels)  OT Visit Diagnosis: Other abnormalities of gait and mobility (R26.89);Pain;History of falling (Z91.81)   Activity Tolerance Patient tolerated treatment well   Patient Left in bed;with call bell/phone within reach   Nurse Communication          Time: 1610-9604 OT Time Calculation (min): 10 min  Charges:    Butch Penny, SOT

## 2023-11-13 NOTE — Plan of Care (Signed)
Problem: Health Behavior/Discharge Planning: Goal: Ability to manage health-related needs will improve Outcome: Progressing   Problem: Clinical Measurements: Goal: Will remain free from infection Outcome: Progressing Goal: Diagnostic test results will improve Outcome: Progressing Goal: Respiratory complications will improve Outcome: Progressing   Problem: Activity: Goal: Risk for activity intolerance will decrease Outcome: Progressing

## 2023-11-13 NOTE — TOC Transition Note (Addendum)
Transition of Care [facility]) - CM/SW Discharge Note   Patient Details  Name: Jonathon Snow MRN: [id number] Date of Birth: 09-17-1961  Transition of Care [facility]) CM/SW Contact:  Darolyn Rua, LCSW Phone Number: 11/13/2023, 11:34 AM   Clinical Narrative:     Per PT/OT patient has progressed to DC home today, patient is interested in [facility] services. Aware insurance may be a barrier in setting up, requests referrals be sent. If no acceptance, agreeable to outpatient PT referral.   CSW has sent [facility] referrals to :  [facility]: Pending response [facility]: unable to accept insurance [facility]: unable to accept insurance [facility]: unable to accept insurance [facility]: unable to accept due to staffing [facility]: unable to accept insurance [facility]: unable to accept insurance   Since [facility] referrals unsuccessful, referral sent to outpatient PT at [facility] patient aware.   3in1 ordered via Cletis Athens with [facility] for bedside delivery, daughter to pick patient up.   No additional discharge needs.   Update 3:59pm Per Cletis Athens with [facility] patient has an outstanding balance with [facility] , therefore they cannot run BSC through insurance he can pay $75 to get [facility] today or call the billing dept at [facility] to settle overdue balance. Patient said he will call and not wait for [facility].         Patient Goals and CMS Choice      Discharge Placement                         Discharge Plan and Services Additional resources added to the After Visit Summary for                                       Social Determinants of Health (SDOH) Interventions SDOH Screenings   Food Insecurity: No Food Insecurity (11/10/2023)  Housing: Low Risk  (02/19/2023)  Transportation Needs: No Transportation Needs (11/10/2023)  Utilities: Not At Risk (11/10/2023)  Alcohol Screen: Low Risk  (10/28/2023)  Depression (PHQ2-9): Low Risk  (10/28/2023)  Social Connections: Unknown (04/25/2022)   Received from [facility]  Tobacco Use: Low Risk  (10/28/2023)     Readmission Risk Interventions     No data to display

## 2023-11-13 NOTE — Telephone Encounter (Signed)
patient is returning phone cal

## 2023-11-13 NOTE — Progress Notes (Signed)
Patient is not able to walk the distance required to go the bathroom, or he/she is unable to safely negotiate stairs required to access the bathroom.  A 3in1 BSC will alleviate this problem  Darolyn Rua, [place], MSW, [place] [phone]

## 2023-11-13 NOTE — Telephone Encounter (Signed)
Left voicemail message to call back.  

## 2023-11-14 ENCOUNTER — Ambulatory Visit: Payer: Medicaid Other | Attending: Cardiology

## 2023-11-14 DIAGNOSIS — R55 Syncope and collapse: Secondary | ICD-10-CM

## 2023-11-16 ENCOUNTER — Encounter: Payer: Medicaid Other | Admitting: Occupational Therapy

## 2023-11-17 ENCOUNTER — Encounter: Payer: Medicaid Other | Admitting: Occupational Therapy

## 2023-11-17 ENCOUNTER — Telehealth: Payer: Self-pay | Admitting: Family Medicine

## 2023-11-17 ENCOUNTER — Other Ambulatory Visit: Payer: Self-pay

## 2023-11-17 ENCOUNTER — Other Ambulatory Visit: Payer: Self-pay | Admitting: Neurosurgery

## 2023-11-17 ENCOUNTER — Telehealth: Payer: Self-pay | Admitting: Internal Medicine

## 2023-11-17 DIAGNOSIS — S22078D Other fracture of T9-T10 vertebra, subsequent encounter for fracture with routine healing: Secondary | ICD-10-CM

## 2023-11-17 NOTE — Telephone Encounter (Signed)
Left message for patient's daughter instructing her to reach out to patient's PCP if she is seeking social services for the patient such as home health

## 2023-11-17 NOTE — Telephone Encounter (Signed)
Daughter Jonathon Snow calling w/ concerns about the pt, her father, is at home, alone.  He broke his back, and had a heart attack.  Pt doesn't have PT, or nursing orders.  I advised [name] he needs to see his dr. Pt cannot drive.  Pt would like to know how he can set up transportation to get to [facility].  That way he can possibly get the orders he needs.  And get the help he needs. [name] would like a call back as well to keep her informed.  [name] She is on the [facility].  Her number in [phone]

## 2023-11-17 NOTE — Telephone Encounter (Signed)
PT was ordered by Neurosurgery today. He will need apt promptly for hospital DC follow up and labs. May need to refer to social worker for transportation assistance but he will need office apt.

## 2023-11-17 NOTE — Telephone Encounter (Signed)
Could you do a virtual visit?

## 2023-11-17 NOTE — Telephone Encounter (Signed)
Called daughter no answer left vm to call back office. Just wanted to inform her that PT was already put in by Neurosurgery today and I also put in a referral for transportation they should be reach to patient If needed. Pt needs to make a hospital follow up visit with our office.

## 2023-11-17 NOTE — Telephone Encounter (Signed)
Patient's daughter called and said that she is very concerned for patient's health being in house along and she said that he is not eating and in weak. Patient's daughter stay at [employer] [place] so she really can't. Would like for Dr. Okey Dupre and nurse to check up on patient and to make sure he is ok and give her a call back

## 2023-11-23 ENCOUNTER — Ambulatory Visit: Payer: Medicaid Other | Admitting: Physician Assistant

## 2023-11-23 ENCOUNTER — Encounter: Payer: Self-pay | Admitting: Physician Assistant

## 2023-11-23 ENCOUNTER — Emergency Department: Payer: Medicaid Other

## 2023-11-23 ENCOUNTER — Other Ambulatory Visit: Payer: Self-pay

## 2023-11-23 ENCOUNTER — Observation Stay
Admission: EM | Admit: 2023-11-23 | Discharge: 2023-11-25 | Disposition: A | Discharge status: Home / Self Care | Payer: Medicaid Other | Attending: Student | Admitting: Student

## 2023-11-23 VITALS — BP 124/80 | Temp 97.8°F | Ht 74.0 in | Wt 226.0 lb

## 2023-11-23 DIAGNOSIS — Z6825 Body mass index (BMI) 25.0-25.9, adult: Secondary | ICD-10-CM | POA: Diagnosis not present

## 2023-11-23 DIAGNOSIS — R112 Nausea with vomiting, unspecified: Secondary | ICD-10-CM | POA: Diagnosis not present

## 2023-11-23 DIAGNOSIS — I10 Essential (primary) hypertension: Secondary | ICD-10-CM

## 2023-11-23 DIAGNOSIS — J9811 Atelectasis: Secondary | ICD-10-CM | POA: Diagnosis not present

## 2023-11-23 DIAGNOSIS — I130 Hypertensive heart and chronic kidney disease with heart failure and stage 1 through stage 4 chronic kidney disease, or unspecified chronic kidney disease: Secondary | ICD-10-CM | POA: Insufficient documentation

## 2023-11-23 DIAGNOSIS — X58XXXA Exposure to other specified factors, initial encounter: Secondary | ICD-10-CM | POA: Diagnosis not present

## 2023-11-23 DIAGNOSIS — R29818 Other symptoms and signs involving the nervous system: Secondary | ICD-10-CM | POA: Diagnosis not present

## 2023-11-23 DIAGNOSIS — R1110 Vomiting, unspecified: Secondary | ICD-10-CM | POA: Diagnosis not present

## 2023-11-23 DIAGNOSIS — R42 Dizziness and giddiness: Principal | ICD-10-CM

## 2023-11-23 DIAGNOSIS — I5032 Chronic diastolic (congestive) heart failure: Secondary | ICD-10-CM | POA: Insufficient documentation

## 2023-11-23 DIAGNOSIS — F418 Other specified anxiety disorders: Secondary | ICD-10-CM | POA: Diagnosis not present

## 2023-11-23 DIAGNOSIS — S22078A Other fracture of T9-T10 vertebra, initial encounter for closed fracture: Secondary | ICD-10-CM | POA: Diagnosis not present

## 2023-11-23 DIAGNOSIS — M329 Systemic lupus erythematosus, unspecified: Secondary | ICD-10-CM

## 2023-11-23 DIAGNOSIS — J984 Other disorders of lung: Secondary | ICD-10-CM | POA: Diagnosis not present

## 2023-11-23 DIAGNOSIS — N400 Enlarged prostate without lower urinary tract symptoms: Secondary | ICD-10-CM | POA: Diagnosis present

## 2023-11-23 DIAGNOSIS — S22079A Unspecified fracture of T9-T10 vertebra, initial encounter for closed fracture: Secondary | ICD-10-CM | POA: Diagnosis present

## 2023-11-23 DIAGNOSIS — Z86711 Personal history of pulmonary embolism: Secondary | ICD-10-CM

## 2023-11-23 DIAGNOSIS — I2510 Atherosclerotic heart disease of native coronary artery without angina pectoris: Secondary | ICD-10-CM | POA: Diagnosis not present

## 2023-11-23 DIAGNOSIS — J449 Chronic obstructive pulmonary disease, unspecified: Secondary | ICD-10-CM | POA: Diagnosis not present

## 2023-11-23 DIAGNOSIS — N529 Male erectile dysfunction, unspecified: Secondary | ICD-10-CM

## 2023-11-23 DIAGNOSIS — R0789 Other chest pain: Secondary | ICD-10-CM | POA: Diagnosis not present

## 2023-11-23 DIAGNOSIS — M549 Dorsalgia, unspecified: Secondary | ICD-10-CM | POA: Diagnosis not present

## 2023-11-23 DIAGNOSIS — E663 Overweight: Secondary | ICD-10-CM

## 2023-11-23 DIAGNOSIS — R079 Chest pain, unspecified: Secondary | ICD-10-CM

## 2023-11-23 DIAGNOSIS — I676 Nonpyogenic thrombosis of intracranial venous system: Secondary | ICD-10-CM | POA: Diagnosis not present

## 2023-11-23 DIAGNOSIS — Z86718 Personal history of other venous thrombosis and embolism: Secondary | ICD-10-CM

## 2023-11-23 DIAGNOSIS — G08 Intracranial and intraspinal phlebitis and thrombophlebitis: Secondary | ICD-10-CM | POA: Diagnosis not present

## 2023-11-23 DIAGNOSIS — S22078D Other fracture of T9-T10 vertebra, subsequent encounter for fracture with routine healing: Secondary | ICD-10-CM

## 2023-11-23 DIAGNOSIS — E785 Hyperlipidemia, unspecified: Secondary | ICD-10-CM | POA: Diagnosis not present

## 2023-11-23 DIAGNOSIS — N1831 Chronic kidney disease, stage 3a: Secondary | ICD-10-CM | POA: Diagnosis not present

## 2023-11-23 DIAGNOSIS — J4489 Other specified chronic obstructive pulmonary disease: Secondary | ICD-10-CM | POA: Insufficient documentation

## 2023-11-23 LAB — BASIC METABOLIC PANEL
Anion gap: 17 — ABNORMAL HIGH (ref 5–15)
BUN: 18 mg/dL (ref 8–23)
CO2: 16 mmol/L — ABNORMAL LOW (ref 22–32)
Calcium: 9.3 mg/dL (ref 8.9–10.3)
Chloride: 99 mmol/L (ref 98–111)
Creatinine, Ser: 1.72 mg/dL — ABNORMAL HIGH (ref 0.61–1.24)
GFR, Estimated: 45 mL/min — ABNORMAL LOW (ref >60)
Glucose, Bld: 91 mg/dL (ref 70–99)
Potassium: 4.4 mmol/L (ref 3.5–5.1)
Sodium: 132 mmol/L — ABNORMAL LOW (ref 135–145)

## 2023-11-23 LAB — CBC
HCT: 42.4 % (ref 39.0–52.0)
Hemoglobin: 13.7 g/dL (ref 13.0–17.0)
MCH: 29.2 pg (ref 26.0–34.0)
MCHC: 32.3 g/dL (ref 30.0–36.0)
MCV: 90.4 fL (ref 80.0–100.0)
Platelets: 343 10*3/uL (ref 150–400)
RBC: 4.69 MIL/uL (ref 4.22–5.81)
RDW: 15.7 % — ABNORMAL HIGH (ref 11.5–15.5)
WBC: 6.3 10*3/uL (ref 4.0–10.5)
nRBC: 0 % (ref 0.0–0.2)

## 2023-11-23 LAB — BRAIN NATRIURETIC PEPTIDE: B Natriuretic Peptide: 23.1 pg/mL (ref 0.0–100.0)

## 2023-11-23 LAB — TROPONIN I (HIGH SENSITIVITY)
Troponin I (High Sensitivity): 6 ng/L (ref <18)
Troponin I (High Sensitivity): 6 ng/L (ref <18)

## 2023-11-23 MED ORDER — AMLODIPINE BESYLATE 5 MG PO TABS
5.0000 mg | ORAL_TABLET | Freq: Every day | ORAL | Status: DC
  Administered 2023-11-24: 5 mg via ORAL
  Filled 2023-11-23: qty 1

## 2023-11-23 MED ORDER — SERTRALINE HCL 50 MG PO TABS
150.0000 mg | ORAL_TABLET | Freq: Every day | ORAL | Status: DC
  Administered 2023-11-24 – 2023-11-25 (×2): 150 mg via ORAL
  Filled 2023-11-23 (×2): qty 3

## 2023-11-23 MED ORDER — VITAMIN D 25 MCG (1000 UNIT) PO TABS
1000.0000 [IU] | ORAL_TABLET | Freq: Every day | ORAL | Status: DC
  Administered 2023-11-24 – 2023-11-25 (×2): 1000 [IU] via ORAL
  Filled 2023-11-23 (×2): qty 1

## 2023-11-23 MED ORDER — FENTANYL CITRATE PF 50 MCG/ML IJ SOSY: 25.0000 ug | PREFILLED_SYRINGE | INTRAMUSCULAR | Status: DC | PRN

## 2023-11-23 MED ORDER — ROSUVASTATIN CALCIUM 20 MG PO TABS
20.0000 mg | ORAL_TABLET | Freq: Every day | ORAL | Status: DC
  Administered 2023-11-24 – 2023-11-25 (×2): 20 mg via ORAL
  Filled 2023-11-23 (×2): qty 1

## 2023-11-23 MED ORDER — ONDANSETRON HCL 4 MG/2ML IJ SOLN
4.0000 mg | Freq: Once | INTRAMUSCULAR | Status: AC
  Administered 2023-11-23: 4 mg via INTRAVENOUS
  Filled 2023-11-23: qty 2

## 2023-11-23 MED ORDER — HYDRALAZINE HCL 20 MG/ML IJ SOLN: 5.0000 mg | INTRAMUSCULAR | Status: DC | PRN

## 2023-11-23 MED ORDER — ADULT MULTIVITAMIN W/MINERALS CH
1.0000 | ORAL_TABLET | Freq: Every day | ORAL | Status: DC
  Administered 2023-11-24 – 2023-11-25 (×2): 1 via ORAL
  Filled 2023-11-23 (×2): qty 1

## 2023-11-23 MED ORDER — METOPROLOL SUCCINATE ER 25 MG PO TB24
12.5000 mg | ORAL_TABLET | Freq: Every day | ORAL | Status: DC
  Administered 2023-11-24 – 2023-11-25 (×2): 12.5 mg via ORAL
  Filled 2023-11-23 (×2): qty 1

## 2023-11-23 MED ORDER — LACTATED RINGERS IV BOLUS
1000.0000 mL | Freq: Once | INTRAVENOUS | Status: AC
  Administered 2023-11-23: 1000 mL via INTRAVENOUS

## 2023-11-23 MED ORDER — DM-GUAIFENESIN ER 30-600 MG PO TB12: 1.0000 | ORAL_TABLET | Freq: Two times a day (BID) | ORAL | Status: DC | PRN

## 2023-11-23 MED ORDER — APIXABAN 2.5 MG PO TABS
2.5000 mg | ORAL_TABLET | Freq: Two times a day (BID) | ORAL | Status: DC
  Administered 2023-11-24 – 2023-11-25 (×4): 2.5 mg via ORAL
  Filled 2023-11-23 (×5): qty 1

## 2023-11-23 MED ORDER — HYDROXYZINE HCL 50 MG PO TABS: 25.0000 mg | ORAL_TABLET | Freq: Two times a day (BID) | ORAL | Status: DC | PRN

## 2023-11-23 MED ORDER — CLOPIDOGREL BISULFATE 75 MG PO TABS
75.0000 mg | ORAL_TABLET | Freq: Every day | ORAL | Status: DC
  Administered 2023-11-24 – 2023-11-25 (×2): 75 mg via ORAL
  Filled 2023-11-23 (×2): qty 1

## 2023-11-23 MED ORDER — CETIRIZINE HCL 10 MG PO TABS
10.0000 mg | ORAL_TABLET | Freq: Every evening | ORAL | Status: DC
  Administered 2023-11-24 (×2): 10 mg via ORAL
  Filled 2023-11-23 (×3): qty 1

## 2023-11-23 MED ORDER — MYCOPHENOLATE MOFETIL 250 MG PO CAPS
500.0000 mg | ORAL_CAPSULE | Freq: Two times a day (BID) | ORAL | Status: DC
  Administered 2023-11-24 – 2023-11-25 (×4): 500 mg via ORAL
  Filled 2023-11-23 (×5): qty 2

## 2023-11-23 MED ORDER — METHOCARBAMOL 500 MG PO TABS: 500.0000 mg | ORAL_TABLET | Freq: Three times a day (TID) | ORAL | Status: DC | PRN

## 2023-11-23 MED ORDER — DIPHENHYDRAMINE HCL 50 MG/ML IJ SOLN
50.0000 mg | Freq: Once | INTRAMUSCULAR | Status: AC
Start: 2023-11-23 — End: 2023-11-23
  Administered 2023-11-23: 50 mg via INTRAVENOUS
  Filled 2023-11-23: qty 1

## 2023-11-23 MED ORDER — ALBUTEROL SULFATE (2.5 MG/3ML) 0.083% IN NEBU: 2.5000 mg | INHALATION_SOLUTION | RESPIRATORY_TRACT | Status: DC | PRN

## 2023-11-23 MED ORDER — GABAPENTIN 100 MG PO CAPS
200.0000 mg | ORAL_CAPSULE | Freq: Three times a day (TID) | ORAL | Status: DC
  Administered 2023-11-24 (×2): 200 mg via ORAL
  Filled 2023-11-23 (×2): qty 2

## 2023-11-23 MED ORDER — MECLIZINE HCL 25 MG PO TABS: 25.0000 mg | ORAL_TABLET | Freq: Three times a day (TID) | ORAL | Status: DC | PRN

## 2023-11-23 MED ORDER — ARIPIPRAZOLE 2 MG PO TABS
2.0000 mg | ORAL_TABLET | Freq: Every day | ORAL | Status: DC
  Administered 2023-11-24 – 2023-11-25 (×2): 2 mg via ORAL
  Filled 2023-11-23 (×2): qty 1

## 2023-11-23 MED ORDER — LACTATED RINGERS IV SOLN: INTRAVENOUS | Status: DC

## 2023-11-23 MED ORDER — NITROGLYCERIN 0.4 MG SL SUBL: 0.4000 mg | SUBLINGUAL_TABLET | SUBLINGUAL | Status: DC | PRN

## 2023-11-23 MED ORDER — OXYCODONE HCL 5 MG PO TABS: 5.0000 mg | ORAL_TABLET | ORAL | Status: DC | PRN

## 2023-11-23 MED ORDER — MONTELUKAST SODIUM 10 MG PO TABS
10.0000 mg | ORAL_TABLET | Freq: Every day | ORAL | Status: DC
  Administered 2023-11-24 (×2): 10 mg via ORAL
  Filled 2023-11-23 (×2): qty 1

## 2023-11-23 MED ORDER — IOHEXOL 350 MG/ML SOLN
75.0000 mL | Freq: Once | INTRAVENOUS | Status: AC | PRN
  Administered 2023-11-23: 75 mL via INTRAVENOUS

## 2023-11-23 MED ORDER — ONDANSETRON HCL 4 MG/2ML IJ SOLN
4.0000 mg | Freq: Three times a day (TID) | INTRAMUSCULAR | Status: DC | PRN
  Administered 2023-11-23: 4 mg via INTRAVENOUS
  Filled 2023-11-23: qty 2

## 2023-11-23 MED ORDER — CLONAZEPAM 0.5 MG PO TABS
0.5000 mg | ORAL_TABLET | Freq: Every day | ORAL | Status: DC
  Administered 2023-11-24 – 2023-11-25 (×2): 0.5 mg via ORAL
  Filled 2023-11-23 (×2): qty 1

## 2023-11-23 NOTE — H&P (Signed)
History and Physical    HOVSEP SINE UJW:[id number] DOB: 1961/07/26 DOA: 11/23/2023  Referring MD/NP/PA:   PCP: Danelle Berry, PA-C   Patient coming from:  The patient is coming from home.     Chief Complaint: dizziness, chest pain  HPI: Jonathon Snow is a 62 y.o. 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  Patient was recently hospitalized from 11/14 - 11/22 due to STEMI and syncope.  Patient had successful LCx stenting 11/14. Also had unstable thoracic spine fracture of T10. Pt was s/p T7-12 posterior fusion 11/15.  Pt states that he has been feeling dizzy for more than 1 week.  He feels the room spinning around him.  The dizziness is worse when he turns around or sitting up.  No ear ringing.  Associated with nausea and multiple episodes of nonbilious nonbloody vomiting.  No abdominal pain or diarrhea.  Sometimes he has right leg tingling, no weakness in extremities.  He has generalized weakness.  He has nausea, no vomiting, diarrhea or abdominal pain.  He also reports chest pain and shortness of breath, no cough, fever or chills.  He states that his shortness breath is chronic issue.  His chest pain is located in front chest, mild, aching, nonradiating.  No symptoms of UTI.     Pt was seen by PA of neurosurgery today, was sent to ED for further evaluation and treatment.  Data reviewed independently and ED Course: pt was found to have trop 6 --> 6, WBC 6.3, BNP 23, worsening renal function with creatinine 1.72, BUN 18, GFR 45 (recent baseline creatinine 1.19-1.39), temperature 98.2, blood pressure 133/78, heart rate of 103, RR 23, oxygen saturation 97% on room air.  Chest x-ray showed mild right basilar atelectasis.  CT of head is negative acute  intracranial abnormalities.  CTA of chest is negative for PE.  Patient is placed on telemetry bed for observation.  MRI-brain: ***   EKG: I have personally reviewed. Has artificial effects, QTc 461, early R wave progression  Review of Systems:   General: no fevers, chills, no body weight gain, has fatigue HEENT: no blurry vision, hearing changes or sore throat Respiratory: has dyspnea, no coughing, wheezing CV: has chest pain, no palpitations GI: has nausea, no vomiting, abdominal pain, diarrhea, constipation GU: no dysuria, burning on urination, increased urinary frequency, hematuria  Ext: no leg edema Neuro: no unilateral weakness, numbness, or tingling, no vision change or hearing loss. Has dizziness Skin: no rash, no skin tear. MSK: No muscle spasm, no deformity, no limitation of range of movement in spin. Has back pain Heme: No easy bruising.  Travel history: No recent long distant travel.   Allergy:  Allergies  Allergen Reactions   Enalapril Other (See Comments)    Unknown reaction   Vicodin [Hydrocodone-Acetaminophen] Hives and Rash    Severe headaches (also) NAME BRAND ONLY PER PT CAN TAKE GENERIC    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   Overview:  superior sagittal sinus, left transverse sinus and cortical veins    COVID-19 virus infection 12/2020   Depression    DVT (deep venous thrombosis) (HCC)    Dyspnea    GERD (gastroesophageal reflux disease)    Heel spur,  left 02/16/2019   Heel spur, right 02/16/2019   Herpes zoster infection of lumbar region 02/20/2020   History of kidney stones    Hyperlipidemia    Hypertension    Lupus    Lymphedema 10/07/2018   Morbid obesity (HCC)    Opiate abuse, episodic (HCC) 02/26/2018   Osteoporosis    Pneumonia    PONV (postoperative nausea and vomiting)    Postphlebitic syndrome with ulcer, 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   Presence of IVC filter  03/22/2020   Removed   Pulmonary embolism (HCC)    Renal disorder    Stage III    Past Surgical History:  Procedure Laterality Date   ANKLE SURGERY Right    BACK SURGERY     BRONCHIAL WASHINGS N/A 11/01/2021   Procedure: BRONCHIAL WASHINGS;  Surgeon: Vida Rigger, MD;  Location: [facility] ORS;  Service: Thoracic;  Laterality: N/A;   COLONOSCOPY WITH PROPOFOL N/A 05/28/2020   Procedure: COLONOSCOPY WITH PROPOFOL;  Surgeon: Wyline Mood, MD;  Location: [facility] ENDOSCOPY;  Service: Endoscopy;  Laterality: N/A;   COLONOSCOPY WITH PROPOFOL N/A 07/01/2023   Procedure: COLONOSCOPY WITH PROPOFOL;  Surgeon: Wyline Mood, MD;  Location: [facility] ENDOSCOPY;  Service: Gastroenterology;  Laterality: N/A;   CORONARY ULTRASOUND/IVUS N/A 11/05/2023   Procedure: Coronary Ultrasound/IVUS;  Surgeon: Yvonne Kendall, MD;  Location: [facility] INVASIVE CV LAB;  Service: Cardiovascular;  Laterality: N/A;   CORONARY/GRAFT ACUTE MI REVASCULARIZATION N/A 11/05/2023   Procedure: Coronary/Graft Acute MI Revascularization;  Surgeon: Yvonne Kendall, MD;  Location: [facility] INVASIVE CV LAB;  Service: Cardiovascular;  Laterality: N/A;   CYST EXCISION  92 or 93    Liver cyst removal [facility]   FLEXIBLE BRONCHOSCOPY N/A 11/01/2021   Procedure: FLEXIBLE BRONCHOSCOPY;  Surgeon: Vida Rigger, MD;  Location: [facility] ORS;  Service: Thoracic;  Laterality: N/A;   HIP PINNING,CANNULATED Left 02/19/2023   Procedure: PERCUTANEOUS FIXATION OF FEMORAL NECK;  Surgeon: Signa Kell, MD;  Location: [facility] ORS;  Service: Orthopedics;  Laterality: Left;   I & D EXTREMITY Right 04/29/2017   Procedure: IRRIGATION AND DEBRIDEMENT EXTREMITY;  Surgeon: Ricarda Frame, MD;  Location: [facility] ORS;  Service: General;  Laterality: Right;   IRRIGATION AND DEBRIDEMENT ABSCESS Left 04/29/2017   Procedure: IRRIGATION AND DEBRIDEMENT Scrotal ABSCESS;  Surgeon: Ricarda Frame, MD;  Location: [facility] ORS;  Service: General;  Laterality: Left;   LEFT HEART CATH AND CORONARY  ANGIOGRAPHY N/A 11/05/2023   Procedure: LEFT HEART CATH AND CORONARY ANGIOGRAPHY;  Surgeon: Yvonne Kendall, MD;  Location: [facility] INVASIVE CV LAB;  Service: Cardiovascular;  Laterality: N/A;   POLYPECTOMY  07/01/2023   Procedure: POLYPECTOMY;  Surgeon: Wyline Mood, MD;  Location: [facility] ENDOSCOPY;  Service: Gastroenterology;;    Social History:  reports that he has never smoked. He has never used smokeless tobacco. He reports that he does not drink alcohol and does not use drugs.  Family History:  Family History  Problem Relation Age of Onset   Hypertension Father    Heart disease Father    Clotting disorder Mother    Kidney disease Brother    Heart attack Maternal Grandmother    Heart attack Maternal Grandfather    Heart attack Paternal Grandfather      Prior to Admission medications   Medication Sig Start Date End Date Taking? Authorizing Provider  albuterol (VENTOLIN HFA) 108 (90 Base) MCG/ACT inhaler Inhale 2 puffs into the lungs every 6 (six) hours as needed for wheezing or shortness of breath. 10/29/22   Berniece Salines, FNP  amLODipine (NORVASC)  5 MG tablet Take 1 tablet (5 mg total) by mouth daily. 11/13/23   Sunnie Nielsen, DO  apixaban (ELIQUIS) 2.5 MG TABS tablet Take 1 tablet (2.5 mg total) by mouth 2 (two) times daily. 11/13/23   Sunnie Nielsen, DO  ARIPiprazole (ABILIFY) 2 MG tablet Take 2 mg by mouth daily. 06/26/23   [provider]  cholecalciferol (VITAMIN D3) 25 MCG (1000 UNIT) tablet Take 1,000 Units by mouth daily.    [provider]  clonazePAM (KLONOPIN) 0.5 MG tablet Take 0.5 mg by mouth daily. 11/01/23   [provider]  clopidogrel (PLAVIX) 75 MG tablet Take 1 tablet (75 mg total) by mouth daily. 11/14/23   Sunnie Nielsen, DO  gabapentin (NEURONTIN) 100 MG capsule Take 2 capsules (200 mg total) by mouth 3 (three) times daily. 11/13/23   Sunnie Nielsen, DO  hydrOXYzine (ATARAX) 25 MG tablet Take 1 tablet (25 mg total) by  mouth 2 (two) times daily as needed for anxiety, itching or nausea. 11/13/23   Sunnie Nielsen, DO  levocetirizine (XYZAL) 5 MG tablet Take 1 tablet (5 mg total) by mouth every evening. 12/08/22   Danelle Berry, PA-C  lidocaine (LIDODERM) 5 % Place 2 patches onto the skin daily. Remove & Discard patch within 12 hours or as directed by MD 11/14/23   Sunnie Nielsen, DO  losartan (COZAAR) 25 MG tablet Take 1 tablet (25 mg total) by mouth daily. 11/14/23   Sunnie Nielsen, DO  methocarbamol (ROBAXIN) 500 MG tablet Take 1 tablet (500 mg total) by mouth every 8 (eight) hours as needed for muscle spasms. 11/13/23   Sunnie Nielsen, DO  metoprolol succinate (TOPROL-XL) 25 MG 24 hr tablet Take 0.5 tablets (12.5 mg total) by mouth daily. 11/14/23   Sunnie Nielsen, DO  montelukast (SINGULAIR) 10 MG tablet Take 1 tablet (10 mg total) by mouth at bedtime. 12/08/22   Danelle Berry, PA-C  Multiple Vitamins-Minerals (MULTIVITAMIN WITH MINERALS) tablet Take 1 tablet by mouth daily.    [provider]  mycophenolate (CELLCEPT) 500 MG tablet Take 2 tablets (1,000 mg total) by mouth 2 (two) times daily. HOLD until neurosurgery is ok to restart this (waiting for incision to heal) 11/13/23   Sunnie Nielsen, DO  oxyCODONE (OXY IR/ROXICODONE) 5 MG immediate release tablet Take 1-2 tablets (5-10 mg total) by mouth every 6 (six) hours as needed for moderate pain (pain score 4-6) or severe pain (pain score 7-10). 11/13/23   Sunnie Nielsen, DO  rosuvastatin (CRESTOR) 20 MG tablet Take 1 tablet (20 mg total) by mouth daily. 11/14/23   Sunnie Nielsen, DO  senna (SENOKOT) 8.6 MG TABS tablet Take 2 tablets (17.2 mg total) by mouth at bedtime. Stop if loose stool 11/13/23   Sunnie Nielsen, DO  sertraline (ZOLOFT) 100 MG tablet Take 1.5 tablets (150 mg total) by mouth daily. 11/13/23   Sunnie Nielsen, DO  tadalafil (CIALIS) 20 MG tablet Take 1 tablet (20 mg total) by mouth daily as needed for  erectile dysfunction. 07/22/23   Vanna Scotland, MD  tamsulosin (FLOMAX) 0.4 MG CAPS capsule Take 1 capsule (0.4 mg total) by mouth daily after supper. 11/13/23   Sunnie Nielsen, DO    Physical Exam: Vitals:   11/23/23 1800 11/23/23 1900 11/23/23 1945 11/23/23 2200  BP: 118/73 133/78 (!) 141/81 (!) 155/81  Pulse: 91 79 85 88  Resp: 16 15 20 16   Temp:  98.2 F (36.8 C)    TempSrc:  Oral    SpO2: 96% 97% 98% 100%  Weight:      Height:       General: Not in acute distress HEENT:       Eyes: PERRL, EOMI, no jaundice       ENT: No discharge from the ears and nose, no pharynx injection, no tonsillar enlargement.        Neck: No JVD, no bruit, no mass felt. Heme: No neck lymph node enlargement. Cardiac: S1/S2, RRR, No murmurs, No gallops or rubs. Respiratory: No rales, wheezing, rhonchi or rubs. GI: Soft, nondistended, nontender, no rebound pain, no organomegaly, BS present. GU: No hematuria Ext: No pitting leg edema bilaterally. 1+DP/PT pulse bilaterally. Musculoskeletal: has mild small area of erythema surrounding surgical staple sites, no active drainage. Skin: No rashes.  Neuro: Alert, oriented X3, cranial nerves II-XII grossly intact, moves all extremities. Psych: Patient is not psychotic, no suicidal or hemocidal ideation.  Labs on Admission: I have personally reviewed following labs and imaging studies  CBC: Recent Labs  Lab 11/23/23 1505  WBC 6.3  HGB 13.7  HCT 42.4  MCV 90.4  PLT 343   Basic Metabolic Panel: Recent Labs  Lab 11/23/23 1505  NA 132*  K 4.4  CL 99  CO2 16*  GLUCOSE 91  BUN 18  CREATININE 1.72*  CALCIUM 9.3   GFR: Estimated Creatinine Clearance: 57.9 mL/min (A) (by C-G formula based on SCr of 1.72 mg/dL (H)). Liver Function Tests: No results for input(s): "AST", "ALT", "ALKPHOS", "BILITOT", "PROT", "ALBUMIN" in the last 168 hours. No results for input(s): "LIPASE", "AMYLASE" in the last 168 hours. No results for input(s): "AMMONIA" in  the last 168 hours. Coagulation Profile: No results for input(s): "INR", "PROTIME" in the last 168 hours. Cardiac Enzymes: No results for input(s): "CKTOTAL", "CKMB", "CKMBINDEX", "TROPONINI" in the last 168 hours. BNP (last 3 results) No results for input(s): "PROBNP" in the last 8760 hours. HbA1C: No results for input(s): "HGBA1C" in the last 72 hours. CBG: No results for input(s): "GLUCAP" in the last 168 hours. Lipid Profile: No results for input(s): "CHOL", "HDL", "LDLCALC", "TRIG", "CHOLHDL", "LDLDIRECT" in the last 72 hours. Thyroid Function Tests: No results for input(s): "TSH", "T4TOTAL", "FREET4", "T3FREE", "THYROIDAB" in the last 72 hours. Anemia Panel: No results for input(s): "VITAMINB12", "FOLATE", "FERRITIN", "TIBC", "IRON", "RETICCTPCT" in the last 72 hours. Urine analysis:    Component Value Date/Time   COLORURINE YELLOW (A) 11/08/2023 1204   APPEARANCEUR HAZY (A) 11/08/2023 1204   APPEARANCEUR Clear 03/25/2023 0833   LABSPEC 1.017 11/08/2023 1204   LABSPEC 1.013 04/10/2015 1036   PHURINE 5.0 11/08/2023 1204   GLUCOSEU NEGATIVE 11/08/2023 1204   GLUCOSEU Negative 04/10/2015 1036   HGBUR LARGE (A) 11/08/2023 1204   BILIRUBINUR NEGATIVE 11/08/2023 1204   BILIRUBINUR Negative 03/25/2023 0833   BILIRUBINUR Negative 04/10/2015 1036   KETONESUR NEGATIVE 11/08/2023 1204   PROTEINUR NEGATIVE 11/08/2023 1204   UROBILINOGEN 0.2 07/23/2021 1423   UROBILINOGEN 0.2 03/17/2015 0025   NITRITE NEGATIVE 11/08/2023 1204   LEUKOCYTESUR SMALL (A) 11/08/2023 1204   LEUKOCYTESUR Negative 04/10/2015 1036   Sepsis Labs: @LABRCNTIP (procalcitonin:4,lacticidven:4) )No results found for this or any previous visit (from the past 240 hour(s)).   Radiological Exams on Admission: CT Angio Chest PE W/Cm &/Or Wo Cm  Result Date: 11/23/2023 CLINICAL DATA:  Pulmonary embolus suspected with high probability. Vomiting, back pain, and chest tightness for a few days. Dizziness. Recent heart  catheterization and back surgery. EXAM: CT ANGIOGRAPHY CHEST WITH CONTRAST TECHNIQUE: Multidetector CT imaging of the chest was performed  using the standard protocol during bolus administration of intravenous contrast. Multiplanar CT image reconstructions and MIPs were obtained to evaluate the vascular anatomy. RADIATION DOSE REDUCTION: This exam was performed according to the departmental dose-optimization program which includes automated exposure control, adjustment of the mA and/or kV according to patient size and/or use of iterative reconstruction technique. CONTRAST:  75mL OMNIPAQUE IOHEXOL 350 MG/ML SOLN COMPARISON:  11/05/2023 FINDINGS: Cardiovascular: Technically adequate study with good opacification of the central and segmental pulmonary arteries and mild motion artifact. No focal filling defects are demonstrated in the pulmonary arteries. No evidence of significant pulmonary embolus. Normal caliber thoracic aorta. Mild aortic and moderate coronary artery calcification. Normal heart size. No pericardial effusions. Mediastinum/Nodes: Small esophageal hiatal hernia. Esophagus is decompressed. No significant lymphadenopathy. Thyroid gland is unremarkable. Lungs/Pleura: Scarring and atelectasis in the lung bases. No airspace disease or consolidation. No pleural effusions. No pneumothorax. Upper Abdomen: Septated cystic lesion in the right lobe of the liver with some coarse calcifications. Lesion measures about 9.2 cm in diameter. No change since prior study. This may represent a biliary cystadenoma or large congenital cyst. Gallbladder appears mildly distended with probable stones present. No gallbladder wall thickening. Musculoskeletal: Postoperative changes with posterior fixation of the lower thoracic spine. Old rib fractures. Review of the MIP images confirms the above findings. IMPRESSION: 1. No evidence of significant pulmonary embolus. 2. No evidence of active pulmonary disease. Scarring in the lung  bases. 3. Unchanged appearance of large cystic lesion in the right lobe of the liver, likely benign. No imaging follow-up is indicated. Electronically Signed   By: Burman Nieves M.D.   On: 11/23/2023 20:21   DG Chest 2 View  Result Date: 11/23/2023 CLINICAL DATA:  Vomiting with back pain and chest tightness. EXAM: CHEST - 2 VIEW COMPARISON:  February 18, 2023 FINDINGS: The heart size and mediastinal contours are within normal limits. Mild atelectasis is seen along the lateral aspect of the right lung base. No pleural effusion or pneumothorax is identified. Postoperative changes are seen within the mid to lower thoracic spine and upper lumbar spine. No acute osseous abnormalities are identified. IMPRESSION: Mild right basilar atelectasis. Electronically Signed   By: Aram Candela M.D.   On: 11/23/2023 18:48   CT Head Wo Contrast  Result Date: 11/23/2023 CLINICAL DATA:  Neuro deficit, acute, stroke suspected. Dizziness. Vomiting. EXAM: CT HEAD WITHOUT CONTRAST TECHNIQUE: Contiguous axial images were obtained from the base of the skull through the vertex without intravenous contrast. RADIATION DOSE REDUCTION: This exam was performed according to the departmental dose-optimization program which includes automated exposure control, adjustment of the mA and/or kV according to patient size and/or use of iterative reconstruction technique. COMPARISON:  Head CT 11/05/2023 FINDINGS: Brain: There is no evidence of an acute infarct, intracranial hemorrhage, mass, midline shift, or extra-axial fluid collection. The ventricles and sulci are normal. 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 Vascular: Calcified atherosclerosis at the skull base. No hyperdense vessel. Skull: No acute fracture or suspicious osseous lesion. Sinuses/Orbits: Small amount of secretions in a posterior right ethmoid air cell. Clear mastoid air cells. Unremarkable orbits. Other: None.  IMPRESSION: No evidence of acute intracranial abnormality. Electronically Signed   By: Sebastian Ache M.D.   On: 11/23/2023 17:05      Assessment/Plan Principal Problem:   Dizziness Active Problems:   Chest pain   CAD (coronary artery disease)   Nausea & vomiting   COPD, moderate (HCC)   Systemic  lupus erythematosus (HCC)   HTN (hypertension)   HLD (hyperlipidemia)   Stage 3a chronic kidney disease (HCC)   Closed T10 spinal fracture (HCC)   History of pulmonary embolism   History of DVT (deep vein thrombosis)   Cerebral venous sinus thrombosis   Depression with anxiety   Overweight (BMI 25.0-29.9)   Chronic diastolic CHF (congestive heart failure) (HCC)   BPH (benign prostatic hyperplasia)   Assessment and Plan:  Dizziness: CT head negative for acute intracranial abnormalities.   -Please in telemetry bed for observation -Fall precaution -Frequent neurocheck -As needed meclizine -IV fluid: 1 L LR, then 75 cc/h -hold Flomax -Follow-up MRI of brain -PT/OT *** vs. vestibular physical therapy -    Chest pain and CAD (coronary artery disease): s/p of recent  LCx stenting. Trop 6 --> 6.  Patient had LDL 119 and A1c 6.0 on 11/05/2023. -prn fentanyl, oxycodone for pain -As needed nitroglycerin -Crestor -Plavix  Nausea & vomiting: Likely due to vertigo.  No abdominal pain or diarrhea. -Supportive care -prn zofran -IV fluid  COPD, moderate (HCC): -Bronchodilators, Singulair, PR Mucinex  Systemic lupus erythematosus (HCC): pt states that his CellCept dose was decreased from 1000 to 500 mg twice daily.  His prednisone was discontinued. -CellCept   HTN (hypertension) -IV hydralazine as needed -Metoprolol, amlodipine -Hold Cozaar due to worsening renal function  HLD (hyperlipidemia) -Crestor  Stage 3a chronic kidney disease (HCC): Worsening than recent baseline. -Hold Cozaar  Closed T10 spinal fracture [facility]): Pt was s/p T7-12 posterior fusion 11/15. -As needed  Percocet -As needed Robaxin  History of pulmonary embolism and History of DVT (deep vein thrombosis) -Continue Eliquis  History of cerebral venous sinus thrombosis -Continue Eliquis  Depression with anxiety -Continue home medications  Overweight (BMI 25.0-29.9): Body weight well 2 kg, BMI 28.49 -Encouraged losing weight - Exercise and healthy diet  Chronic diastolic CHF: 2D echo on 11/06/2023 showed EF of 55-60% with grade 1 diastolic dysfunction.  Patient does not have leg edema JVD.  BNP normal 23.  CHF is compensated. -watch volume status closely.  BPH: -hold Flomax due to dizziness     DVT ppx: on Eliquis   Code Status: Full code     Family Communication: not done, no family member is at bed side.    Disposition Plan:  Anticipate discharge back to previous environment  Consults called:  none   Admission status and Level of care: Telemetry Cardiac:    for obs   Dispo: The patient is from: Home              Anticipated d/c is to: Home              Anticipated d/c date is: 1 day              Patient currently is not medically stable to d/c.    Severity of Illness:  The appropriate patient status for this patient is OBSERVATION. Observation status is judged to be reasonable and necessary in order to provide the required intensity of service to ensure the patient's safety. The patient's presenting symptoms, physical exam findings, and initial radiographic and laboratory data in the context of their medical condition is felt to place them at decreased risk for further clinical deterioration. Furthermore, it is anticipated that the patient will be medically stable for discharge from the hospital within 2 midnights of admission.        Date of Service 11/23/2023    Lorretta Harp [facility]  If 7PM-7AM, please contact night-coverage [email or web address] 11/23/2023, 11:27 PM

## 2023-11-23 NOTE — Progress Notes (Signed)
   REFERRING PHYSICIAN:  Danelle Berry, Pa-c [address] [place],  [name] [address]  DOS: 11/06/23, T7-12 Posterior fusion with Dr. Myer Haff  HISTORY OF PRESENT ILLNESS: Jonathon Snow is approximately 2 weeks s/p above surgery. Patient is also 2 weeks s/p heart catheterization due to STEMI. Upon coming to clinic today, patient states he was extremely nauseous and on the way here vomited on his back brace. He complains of chest tightness and shortness of breath as well. He denies fever.  PHYSICAL EXAMINATION:  General: Patient is ill appearing.    NEUROLOGICAL:   Awake, alert, oriented to person, place, and time.   Strength not tested due to patient condition            Patient does have erythema surrounding his incision sites. Most staples removed today by nurse. Some drainage also noted.   ROS (Neurologic):  Negative except as noted above   ASSESSMENT/PLAN:  Jonathon Snow is approximately 2 weeks s/p above surgery. Patient is also 2 weeks s/p heart catheterization due to STEMI. Upon coming to clinic today, patient states he was extremely nauseous and on the way here vomited on his back brace. He complains of chest tightness and shortness of breath as well. Erythema noted around his incision sites with some drainage.  Due to patient's condition, patient transport was called for him to go to the ED to be evaluated.  Jonathon Flores PA-C Department of neurosurgery

## 2023-11-23 NOTE — ED Notes (Signed)
Pt states nausea has returned and dizziness is still present

## 2023-11-23 NOTE — ED Triage Notes (Signed)
Pt to ED for emesis, back pain and chest tightness for past few days. +dizziness.  Reports recent heart cath and back surgery. Pt appears shob, difficulty speaking in complete sentences. Reports shob is chronic.

## 2023-11-23 NOTE — ED Notes (Signed)
Swallow screen completed with a PASS. Provider notified and also informed of patient request to eat and drink. Awaiting response back to update patient at this time.

## 2023-11-23 NOTE — ED Provider Notes (Signed)
[facility] Provider Note    Event Date/Time   First MD Initiated Contact with Patient 11/23/23 1523     (approximate)   History   Chief Complaint Emesis and Chest Pain   HPI  Jonathon Snow is a 62 y.o. 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  Patient recently admitted to the hospital for STEMI following syncopal episode as well as T10 fracture requiring fusion.  He states that since he left the hospital he has been feeling dizzy with a sensation that the room is spinning around him.  Symptoms have been gradually worsening since his discharge a couple of weeks ago, he has now feeling nauseous and vomiting when he attempts to move.  He denies any changes in his vision or speech, has not had any focal numbness or weakness in his extremities, does describe generalized weakness.  He also endorses some tightness and sharp pain in the center of his chest starting earlier today along with acute on chronic shortness of breath.  He reports being compliant with his Eliquis dosing.     Physical Exam   Triage Vital Signs: ED Triage Vitals  Encounter Vitals Group     BP 11/23/23 1504 132/78     Systolic BP Percentile --      Diastolic BP Percentile --      Pulse Rate 11/23/23 1504 (!) 103     Resp 11/23/23 1503 (!) 23     Temp 11/23/23 1542 (!) 97.4 F (36.3 C)     Temp Source 11/23/23 1542 Oral     SpO2 11/23/23 1504 98 %     Weight 11/23/23 1503 224 lb 13.9 oz (102 kg)     Height 11/23/23 1503 6' 2.5" (1.892 m)     Head Circumference --      Peak Flow --      Pain Score 11/23/23 1503 6     Pain Loc --      Pain Education --      Exclude from Growth Chart --     Most recent vital signs: Vitals:   11/23/23 1800 11/23/23 1900  BP: 118/73 133/78  Pulse: 91 79  Resp: 16 15  Temp:  98.2 F (36.8 C)  SpO2:  96% 97%    Constitutional: Alert and oriented. Eyes: Conjunctivae are normal. Head: Atraumatic. Nose: No congestion/rhinnorhea. Mouth/Throat: Mucous membranes are dry. Cardiovascular: Normal rate, regular rhythm. Grossly normal heart sounds.  2+ radial pulses bilaterally. Respiratory: Normal respiratory effort.  No retractions. Lungs CTAB. Gastrointestinal: Soft and nontender. No distention. Musculoskeletal: No lower extremity tenderness nor edema.  Neurologic:  Normal speech and language. No gross focal neurologic deficits are appreciated.    ED Results / Procedures / Treatments   Labs (all labs ordered are listed, but only abnormal results are displayed) Labs Reviewed  BASIC METABOLIC PANEL - Abnormal; Notable for the following components:      Result Value   Sodium 132 (*)    CO2 16 (*)    Creatinine, Ser 1.72 (*)    GFR, Estimated 45 (*)    Anion gap 17 (*)    All other components within normal limits  CBC - Abnormal; Notable for the following components:   RDW 15.7 (*)    All other components within normal limits  BRAIN NATRIURETIC PEPTIDE  TROPONIN I (HIGH SENSITIVITY)  TROPONIN I (HIGH SENSITIVITY)     EKG  ED ECG REPORT I, Chesley Noon, the attending physician, personally viewed and interpreted this ECG.   Date: 11/23/2023  EKG Time: 15:10  Rate: 100  Rhythm: sinus tachycardia  Axis: Normal  Intervals:none  ST&T Change: None  RADIOLOGY CT head reviewed and interpreted by me with no hemorrhage or midline shift.  PROCEDURES:  Critical Care performed: No  Procedures   MEDICATIONS ORDERED IN ED: Medications  meclizine (ANTIVERT) tablet 25 mg (has no administration in time range)  diphenhydrAMINE (BENADRYL) injection 50 mg (50 mg Intravenous Given 11/23/23 1558)  ondansetron (ZOFRAN) injection 4 mg (4 mg Intravenous Given 11/23/23 1558)  lactated ringers bolus 1,000 mL (0 mLs Intravenous Stopped 11/23/23 1854)  iohexol (OMNIPAQUE) 350 MG/ML  injection 75 mL (75 mLs Intravenous Contrast Given 11/23/23 1639)     IMPRESSION / MDM / ASSESSMENT AND PLAN / ED COURSE  I reviewed the triage vital signs and the nursing notes.                              62 y.o. 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  Patient's presentation is most consistent with acute presentation with potential threat to life or bodily function.  Differential diagnosis includes, but is not limited to, ACS, PE, pneumonia, pneumothorax, anemia, electrolyte abnormality, AKI, peripheral vertigo, central vertigo, dehydration.  Patient chronically ill-appearing but nontoxic and in no acute distress, vital signs are unremarkable.  He is not in any respiratory distress and lungs are clear to auscultation bilaterally.  EKG shows no evidence of arrhythmia or ischemia, troponin pending at this time but patient at high risk for PE given his history and recent admission.  Will check CTA of his chest, also check CT head given persistent dizziness and possible central vertigo.  We will treat symptomatically with IV Zofran and Benadryl for possible peripheral vertigo, additional labs pending at this time.  CT head negative for acute process, CTA chest negative for PE, pneumonia, or other acute finding.  Labs show mild AKI without acute electrolyte abnormality, anemia, or leukocytosis.  2 sets of troponin within normal limits and I doubt ACS, but patient continues to have significant dizziness and nausea on reassessment.  He would benefit from MRI to rule out stroke, given intractable vomiting with AKI, case discussed with hospitalist for admission.      FINAL CLINICAL IMPRESSION(S) / ED DIAGNOSES   Final diagnoses:   Dizziness  Chest pain, unspecified type     Rx / DC Orders   ED Discharge Orders     None        Note:  This document was prepared using Dragon voice recognition software and may include unintentional dictation errors.   Chesley Noon, MD 11/23/23 2111

## 2023-11-24 ENCOUNTER — Encounter: Payer: Self-pay | Admitting: Osteopathic Medicine

## 2023-11-24 ENCOUNTER — Encounter: Payer: Medicaid Other | Admitting: Occupational Therapy

## 2023-11-24 DIAGNOSIS — R42 Dizziness and giddiness: Secondary | ICD-10-CM | POA: Diagnosis not present

## 2023-11-24 LAB — PROTIME-INR
INR: 1.6 — ABNORMAL HIGH (ref 0.8–1.2)
Prothrombin Time: 19.5 s — ABNORMAL HIGH (ref 11.4–15.2)

## 2023-11-24 LAB — BASIC METABOLIC PANEL
Anion gap: 12 (ref 5–15)
BUN: 18 mg/dL (ref 8–23)
CO2: 19 mmol/L — ABNORMAL LOW (ref 22–32)
Calcium: 8.4 mg/dL — ABNORMAL LOW (ref 8.9–10.3)
Chloride: 103 mmol/L (ref 98–111)
Creatinine, Ser: 1.57 mg/dL — ABNORMAL HIGH (ref 0.61–1.24)
GFR, Estimated: 50 mL/min — ABNORMAL LOW (ref >60)
Glucose, Bld: 78 mg/dL (ref 70–99)
Potassium: 4.1 mmol/L (ref 3.5–5.1)
Sodium: 134 mmol/L — ABNORMAL LOW (ref 135–145)

## 2023-11-24 LAB — CBC
HCT: 34.8 % — ABNORMAL LOW (ref 39.0–52.0)
Hemoglobin: 11.2 g/dL — ABNORMAL LOW (ref 13.0–17.0)
MCH: 29.2 pg (ref 26.0–34.0)
MCHC: 32.2 g/dL (ref 30.0–36.0)
MCV: 90.9 fL (ref 80.0–100.0)
Platelets: 255 10*3/uL (ref 150–400)
RBC: 3.83 MIL/uL — ABNORMAL LOW (ref 4.22–5.81)
RDW: 15.7 % — ABNORMAL HIGH (ref 11.5–15.5)
WBC: 4.6 10*3/uL (ref 4.0–10.5)
nRBC: 0 % (ref 0.0–0.2)

## 2023-11-24 LAB — HEPATIC FUNCTION PANEL
ALT: 11 U/L (ref 0–44)
AST: 15 U/L (ref 15–41)
Albumin: 2.9 g/dL — ABNORMAL LOW (ref 3.5–5.0)
Alkaline Phosphatase: 207 U/L — ABNORMAL HIGH (ref 38–126)
Bilirubin, Direct: 0.3 mg/dL — ABNORMAL HIGH (ref 0.0–0.2)
Indirect Bilirubin: 0.9 mg/dL (ref 0.3–0.9)
Total Bilirubin: 1.2 mg/dL — ABNORMAL HIGH (ref <1.2)
Total Protein: 6.1 g/dL — ABNORMAL LOW (ref 6.5–8.1)

## 2023-11-24 LAB — TROPONIN I (HIGH SENSITIVITY): Troponin I (High Sensitivity): 7 ng/L (ref <18)

## 2023-11-24 MED ORDER — MECLIZINE HCL 25 MG PO TABS
25.0000 mg | ORAL_TABLET | Freq: Three times a day (TID) | ORAL | Status: DC
  Administered 2023-11-24 – 2023-11-25 (×4): 25 mg via ORAL
  Filled 2023-11-24 (×7): qty 1

## 2023-11-24 MED ORDER — LIDOCAINE 5 % EX OINT
TOPICAL_OINTMENT | Freq: Once | CUTANEOUS | Status: AC
  Filled 2023-11-24: qty 35.44

## 2023-11-24 MED ORDER — GABAPENTIN 100 MG PO CAPS
100.0000 mg | ORAL_CAPSULE | Freq: Three times a day (TID) | ORAL | Status: DC
Start: 2023-11-24 — End: 2023-11-25
  Administered 2023-11-24 – 2023-11-25 (×3): 100 mg via ORAL
  Filled 2023-11-24 (×3): qty 1

## 2023-11-24 NOTE — ED Notes (Signed)
[name] @ [phone] (Medical POA): called and updated per patient request about current condition and admission status.

## 2023-11-24 NOTE — ED Notes (Signed)
EDT transporting patient to inpatient room at this time.

## 2023-11-24 NOTE — ED Notes (Addendum)
Assisted patient to toilet with 1 assist using patients own front wheel walker. Patient is able to ambulate well with 1 assist to toilet and back.

## 2023-11-24 NOTE — Hospital Course (Addendum)
HPI: Jonathon Snow is a 62 y.o. 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   Patient was recently hospitalized from 11/14 - 11/22 due to STEMI and syncope.  Patient had successful LCx stenting 11/14. Also had unstable thoracic spine fracture of T10. Pt was s/p T7-12 posterior fusion 11/15.   Pt states that he has been feeling dizzy for more than 1 week.  He feels the room spinning around him.  The dizziness is worse when he turns around or sitting up. Pt was seen by PA of neurosurgery today, was sent to ED for further evaluation and treatment d/t nausea/vomiting in the office  Hospital course / significant events:  12/02: admitted to hospitalist service for vertigo and CP r/o, also AKI. MRI brain no concerns. Troponins flat.  12/03: persistent vertigo w/ positional changes - concern for fall risk. PT/OT eval. Starting scheduled meclizine  Consultants:  none  Procedures/Surgeries: none      ASSESSMENT & PLAN:   Vertigo Likely due to peripheral vertigo.  MRI of brain is negative  As needed meclizine --> scheduled hold Flomax, amlodipine  Tapering down on gabapentin vestibular physical therapy Consider Valium if meclizine unhelpful  Chest pain and CAD (coronary artery disease):  s/p recent  LCx stenting. prn oxycodone for pain As needed nitroglycerin Crestor, Plavix, beta blocker  Holding ARB d/t renal function    Nausea & vomiting:  Likely due to vertigo.  No abdominal pain or diarrhea. Supportive care prn zofran IV fluid d/c, he is tolerating po    AKI on CKD3a S/p IV fluids Hold Cozaar due to worsening renal function Monitor BMP  COPD, moderate (HCC): Bronchodilators, Singulair, prn Mucinex   Systemic lupus erythematosus  (HCC):  states that his CellCept dose was decreased from 1000 to 500 mg twice daily.  His prednisone was discontinued. CellCept    HTN (hypertension) IV hydralazine as needed Metoprolol Hold Cozaar due to worsening renal function   HLD (hyperlipidemia) Crestor    Closed T10 spinal fracture (HCC): Pt was s/p T7-12 posterior fusion 11/15. As needed Percocet As needed Robaxin   Skin staples removal - procedure  Removed 7 staples which were not removed at neurosurgery office were removed by myself today with topical lidocaine analgesia given that some were buried. NO complications   Mild cellulitis at surgical site  No sepsis Keflex  Monitor   History of pulmonary embolism and History of DVT (deep vein thrombosis) Continue Eliquis   History of cerebral venous sinus thrombosis Continue Eliquis   Depression with anxiety Continue home medications   Overweight (BMI 25.0-29.9): Body weight well 2 kg, BMI 28.49 Encouraged losing weight Exercise and healthy diet   Chronic diastolic CHF: 2D echo on 11/06/2023 showed EF of 55-60% with grade 1 diastolic dysfunction.  Patient does not have leg edema JVD.  BNP normal 23.  CHF is compensated. watch volume status closely.   BPH: hold Flomax due to dizziness   DVT prophylaxis: Eliquis IV fluids: have d/c continuous IV fluids  Nutrition: cardiac diet  Central lines / invasive devices: none  Code Status: FULL CODE ACP documentation reviewed:  none on file in [facility]  TOC needs: home health  Barriers to dispo / significant pending items: fall risk, recent spinal fixation/fracture, keeping here today to hopefully find relief  w/ meclizine / trial valium and work w/ PT/OT

## 2023-11-24 NOTE — Progress Notes (Signed)
PROGRESS NOTE    DONSHAY Snow   ZOX:[id number] DOB: [birth year]  DOA: 11/23/2023 Date of Service: 11/24/23 which is hospital day 0  PCP: Danelle Berry, PA-C    HPI: Jonathon Snow is a 62 y.o. 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   Patient was recently hospitalized from 11/14 - 11/22 due to STEMI and syncope.  Patient had successful LCx stenting 11/14. Also had unstable thoracic spine fracture of T10. Pt was s/p T7-12 posterior fusion 11/15.   Pt states that he has been feeling dizzy for more than 1 week.  He feels the room spinning around him.  The dizziness is worse when he turns around or sitting up. Pt was seen by PA of neurosurgery today, was sent to ED for further evaluation and treatment d/t nausea/vomiting in the office  Hospital course / significant events:  12/02: admitted to hospitalist service for vertigo and CP r/o, also AKI. MRI brain no concerns. Troponins flat.  12/03: persistent vertigo w/ positional changes - concern for fall risk. PT/OT eval. Starting scheduled meclizine  Consultants:  none  Procedures/Surgeries: none      ASSESSMENT & PLAN:   Vertigo Likely due to peripheral vertigo.  MRI of brain is negative  As needed meclizine --> scheduled hold Flomax, amlodipine  Tapering down on gabapentin vestibular physical therapy Consider Valium if meclizine unhelpful  Chest pain and CAD (coronary artery disease):  s/p recent  LCx stenting. prn oxycodone for pain As needed nitroglycerin Crestor, Plavix, beta blocker  Holding ARB d/t renal function    Nausea & vomiting:  Likely due to vertigo.  No abdominal pain or diarrhea. Supportive care prn zofran IV fluid d/c, he is tolerating po    AKI on  CKD3a S/p IV fluids Hold Cozaar due to worsening renal function Monitor BMP  COPD, moderate (HCC): Bronchodilators, Singulair, prn Mucinex   Systemic lupus erythematosus (HCC):  states that his CellCept dose was decreased from 1000 to 500 mg twice daily.  His prednisone was discontinued. CellCept    HTN (hypertension) IV hydralazine as needed Metoprolol Hold Cozaar due to worsening renal function   HLD (hyperlipidemia) Crestor    Closed T10 spinal fracture (HCC): Pt was s/p T7-12 posterior fusion 11/15. As needed Percocet As needed Robaxin   Skin staples removal - procedure  Removed 7 staples which were not removed at neurosurgery office were removed by myself today with topical lidocaine analgesia given that some were buried. NO complications   Mild cellulitis at surgical site  No sepsis Keflex  Monitor   History of pulmonary embolism and History of DVT (deep vein thrombosis) Continue Eliquis   History of cerebral venous sinus thrombosis Continue Eliquis   Depression with anxiety Continue home medications   Overweight (BMI 25.0-29.9): Body weight well 2 kg, BMI 28.49 Encouraged losing weight Exercise and healthy diet   Chronic diastolic CHF: 2D echo on 11/06/2023 showed EF of 55-60% with grade 1 diastolic dysfunction.  Patient does not have leg edema JVD.  BNP normal 23.  CHF is compensated. watch volume status closely.   BPH: hold Flomax due to dizziness   DVT prophylaxis: Eliquis IV fluids: have d/c continuous IV fluids  Nutrition: cardiac diet  Central lines / invasive devices: none  Code Status: FULL CODE ACP  documentation reviewed:  none on file in [facility]  TOC needs: home health  Barriers to dispo / significant pending items: fall risk, recent spinal fixation/fracture, keeping here today to hopefully find relief w/ meclizine / trial valium and work w/ PT/OT             Subjective / Brief ROS:  Patient reports persistent  dizziness/spinning with minimal position change, has not vomited this morning  Reports staples were left in place fromsurgery and would like these out if possible Denies CP/SOB.  Pain controlled.  Denies new weakness.  Tolerating diet Reports no concerns w/ urination/defecation.   Family Communication: none at this time     Objective Findings:  Vitals:   11/24/23 0500 11/24/23 0600 11/24/23 0700 11/24/23 1020  BP: 125/63 121/70 125/75 134/79  Pulse: 93 88 85 94  Resp: 18 15 15    Temp: 98 F (36.7 C)     TempSrc:      SpO2: 94% 95% 96%   Weight:      Height:        Intake/Output Summary (Last 24 hours) at 11/24/2023 1517 Last data filed at 11/24/2023 0818 Gross per 24 hour  Intake 240 ml  Output --  Net 240 ml   Filed Weights   11/23/23 1503  Weight: 102 kg    Examination:  Physical Exam Cardiovascular:     Rate and Rhythm: Normal rate and regular rhythm.     Heart sounds: Normal heart sounds.  Pulmonary:     Effort: Pulmonary effort is normal.     Breath sounds: Normal breath sounds.  Abdominal:     General: Bowel sounds are normal.  Musculoskeletal:     Right lower leg: No edema.     Left lower leg: No edema.  Skin:    Comments: Surgical site on back evaluated - 7 staples were left, other removed, mild erythema and scant drainage where few of staples were left in on the right.   Neurological:     General: No focal deficit present.     Mental Status: He is oriented to person, place, and time.  Psychiatric:        Mood and Affect: Mood normal.        Behavior: Behavior normal.          Scheduled Medications:   apixaban  2.5 mg Oral BID   ARIPiprazole  2 mg Oral Daily   cetirizine  10 mg Oral QPM   cholecalciferol  1,000 Units Oral Daily   clonazePAM  0.5 mg Oral Daily   clopidogrel  75 mg Oral Daily   gabapentin  100 mg Oral TID   meclizine  25 mg Oral TID   metoprolol succinate  12.5 mg Oral Daily   montelukast  10 mg Oral QHS    multivitamin with minerals  1 tablet Oral Daily   mycophenolate  500 mg Oral BID   rosuvastatin  20 mg Oral Daily   sertraline  150 mg Oral Daily    Continuous Infusions:   PRN Medications:  albuterol, dextromethorphan-guaiFENesin, hydrALAZINE, hydrOXYzine, methocarbamol, nitroGLYCERIN, ondansetron (ZOFRAN) IV, oxyCODONE  Antimicrobials from admission:  Anti-infectives (From admission, onward)    None           Data Reviewed:  I have personally reviewed the following...  CBC: Recent Labs  Lab 11/23/23 1505 11/24/23 0442  WBC 6.3 4.6  HGB 13.7 11.2*  HCT 42.4 34.8*  MCV 90.4 90.9  PLT 343 255  Basic Metabolic Panel: Recent Labs  Lab 11/23/23 1505 11/24/23 0442  NA 132* 134*  K 4.4 4.1  CL 99 103  CO2 16* 19*  GLUCOSE 91 78  BUN 18 18  CREATININE 1.72* 1.57*  CALCIUM 9.3 8.4*   GFR: Estimated Creatinine Clearance: 63.5 mL/min (A) (by C-G formula based on SCr of 1.57 mg/dL (H)). Liver Function Tests: Recent Labs  Lab 11/24/23 0442  AST 15  ALT 11  ALKPHOS 207*  BILITOT 1.2*  PROT 6.1*  ALBUMIN 2.9*   No results for input(s): "LIPASE", "AMYLASE" in the last 168 hours. No results for input(s): "AMMONIA" in the last 168 hours. Coagulation Profile: Recent Labs  Lab 11/24/23 0442  INR 1.6*   Cardiac Enzymes: No results for input(s): "CKTOTAL", "CKMB", "CKMBINDEX", "TROPONINI" in the last 168 hours. BNP (last 3 results) No results for input(s): "PROBNP" in the last 8760 hours. HbA1C: No results for input(s): "HGBA1C" in the last 72 hours. CBG: No results for input(s): "GLUCAP" in the last 168 hours. Lipid Profile: No results for input(s): "CHOL", "HDL", "LDLCALC", "TRIG", "CHOLHDL", "LDLDIRECT" in the last 72 hours. Thyroid Function Tests: No results for input(s): "TSH", "T4TOTAL", "FREET4", "T3FREE", "THYROIDAB" in the last 72 hours. Anemia Panel: No results for input(s): "VITAMINB12", "FOLATE", "FERRITIN", "TIBC", "IRON", "RETICCTPCT" in  the last 72 hours. Most Recent Urinalysis On File:     Component Value Date/Time   COLORURINE YELLOW (A) 11/08/2023 1204   APPEARANCEUR HAZY (A) 11/08/2023 1204   APPEARANCEUR Clear 03/25/2023 0833   LABSPEC 1.017 11/08/2023 1204   LABSPEC 1.013 04/10/2015 1036   PHURINE 5.0 11/08/2023 1204   GLUCOSEU NEGATIVE 11/08/2023 1204   GLUCOSEU Negative 04/10/2015 1036   HGBUR LARGE (A) 11/08/2023 1204   BILIRUBINUR NEGATIVE 11/08/2023 1204   BILIRUBINUR Negative 03/25/2023 0833   BILIRUBINUR Negative 04/10/2015 1036   KETONESUR NEGATIVE 11/08/2023 1204   PROTEINUR NEGATIVE 11/08/2023 1204   UROBILINOGEN 0.2 07/23/2021 1423   UROBILINOGEN 0.2 03/17/2015 0025   NITRITE NEGATIVE 11/08/2023 1204   LEUKOCYTESUR SMALL (A) 11/08/2023 1204   LEUKOCYTESUR Negative 04/10/2015 1036   Sepsis Labs: @LABRCNTIP (procalcitonin:4,lacticidven:4) Microbiology: No results found for this or any previous visit (from the past 240 hour(s)).    Radiology Studies last 3 days: MR BRAIN WO CONTRAST  Result Date: 11/23/2023 CLINICAL DATA:  Dizziness EXAM: MRI HEAD WITHOUT CONTRAST TECHNIQUE: Multiplanar, multiecho pulse sequences of the brain and surrounding structures were obtained without intravenous contrast. COMPARISON:  11/23/2023 head CT FINDINGS: Brain: No acute infarct, mass effect or extra-axial collection. No chronic microhemorrhage or siderosis. Normal white matter signal, parenchymal volume and CSF spaces. The midline structures are normal. Vascular: Normal flow voids. Skull and upper cervical spine: Normal marrow signal. Sinuses/Orbits: Negative. Other: None. IMPRESSION: Normal brain MRI. Electronically Signed   By: Deatra Robinson M.D.   On: 11/23/2023 23:51   CT Angio Chest PE W/Cm &/Or Wo Cm  Result Date: 11/23/2023 CLINICAL DATA:  Pulmonary embolus suspected with high probability. Vomiting, back pain, and chest tightness for a few days. Dizziness. Recent heart catheterization and back surgery. EXAM:  CT ANGIOGRAPHY CHEST WITH CONTRAST TECHNIQUE: Multidetector CT imaging of the chest was performed using the standard protocol during bolus administration of intravenous contrast. Multiplanar CT image reconstructions and MIPs were obtained to evaluate the vascular anatomy. RADIATION DOSE REDUCTION: This exam was performed according to the departmental dose-optimization program which includes automated exposure control, adjustment of the mA and/or kV according to patient size and/or use of iterative reconstruction technique.  CONTRAST:  75mL OMNIPAQUE IOHEXOL 350 MG/ML SOLN COMPARISON:  11/05/2023 FINDINGS: Cardiovascular: Technically adequate study with good opacification of the central and segmental pulmonary arteries and mild motion artifact. No focal filling defects are demonstrated in the pulmonary arteries. No evidence of significant pulmonary embolus. Normal caliber thoracic aorta. Mild aortic and moderate coronary artery calcification. Normal heart size. No pericardial effusions. Mediastinum/Nodes: Small esophageal hiatal hernia. Esophagus is decompressed. No significant lymphadenopathy. Thyroid gland is unremarkable. Lungs/Pleura: Scarring and atelectasis in the lung bases. No airspace disease or consolidation. No pleural effusions. No pneumothorax. Upper Abdomen: Septated cystic lesion in the right lobe of the liver with some coarse calcifications. Lesion measures about 9.2 cm in diameter. No change since prior study. This may represent a biliary cystadenoma or large congenital cyst. Gallbladder appears mildly distended with probable stones present. No gallbladder wall thickening. Musculoskeletal: Postoperative changes with posterior fixation of the lower thoracic spine. Old rib fractures. Review of the MIP images confirms the above findings. IMPRESSION: 1. No evidence of significant pulmonary embolus. 2. No evidence of active pulmonary disease. Scarring in the lung bases. 3. Unchanged appearance of large  cystic lesion in the right lobe of the liver, likely benign. No imaging follow-up is indicated. Electronically Signed   By: Burman Nieves M.D.   On: 11/23/2023 20:21   DG Chest 2 View  Result Date: 11/23/2023 CLINICAL DATA:  Vomiting with back pain and chest tightness. EXAM: CHEST - 2 VIEW COMPARISON:  February 18, 2023 FINDINGS: The heart size and mediastinal contours are within normal limits. Mild atelectasis is seen along the lateral aspect of the right lung base. No pleural effusion or pneumothorax is identified. Postoperative changes are seen within the mid to lower thoracic spine and upper lumbar spine. No acute osseous abnormalities are identified. IMPRESSION: Mild right basilar atelectasis. Electronically Signed   By: Aram Candela M.D.   On: 11/23/2023 18:48   CT Head Wo Contrast  Result Date: 11/23/2023 CLINICAL DATA:  Neuro deficit, acute, stroke suspected. Dizziness. Vomiting. EXAM: CT HEAD WITHOUT CONTRAST TECHNIQUE: Contiguous axial images were obtained from the base of the skull through the vertex without intravenous contrast. RADIATION DOSE REDUCTION: This exam was performed according to the departmental dose-optimization program which includes automated exposure control, adjustment of the mA and/or kV according to patient size and/or use of iterative reconstruction technique. COMPARISON:  Head CT 11/05/2023 FINDINGS: Brain: There is no evidence of an acute infarct, intracranial hemorrhage, mass, midline shift, or extra-axial fluid collection. The ventricles and sulci are normal. 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 Vascular: Calcified atherosclerosis at the skull base. No hyperdense vessel. Skull: No acute fracture or suspicious osseous lesion. Sinuses/Orbits: Small amount of secretions in a posterior right ethmoid air cell. Clear mastoid air cells. Unremarkable orbits. Other: None. IMPRESSION: No evidence of acute intracranial  abnormality. Electronically Signed   By: Sebastian Ache M.D.   On: 11/23/2023 17:05       Sunnie Nielsen, DO [facility] 11/24/2023, 3:17 PM    Dictation software may have been used to generate the above note. Typos may occur and escape review in typed/dictated notes. Please contact Dr Lyn Hollingshead directly for clarity if needed.  Staff may message me via secure chat in Epic  but this may not receive an immediate response,  please page me for urgent matters!  If 7PM-7AM, please contact night coverage [email or web address]

## 2023-11-24 NOTE — ED Notes (Addendum)
Patient received dinner at this time.

## 2023-11-24 NOTE — ED Notes (Signed)
Call patient's daughter @ [phone] [name] she wants to check on her dad she is out of town please call

## 2023-11-24 NOTE — ED Notes (Addendum)
Pt ambulated independently to and from bathroom with walker. Pt has been using walker since last back surgery. Pt back in bed, eating breakfast independently. Pt states that dizziness and nausea with movement is mildly better than yesterday, but not much improved.

## 2023-11-24 NOTE — Progress Notes (Signed)
Physical Therapy Evaluation Patient Details Name: Jonathon Snow MRN: [id number] DOB: 1961-07-03 Today's Date: 11/24/2023  History of Present Illness  Jonathon Snow is a 62 y.o. 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 Patient was recently hospitalized from 11/14 - 11/22 due to STEMI and syncope.  Patient had successful LCx stenting 11/14. Also had unstable thoracic spine fracture of T10. Pt was s/p T7-12 posterior fusion 11/15. He states that since he left the hospital he has been feeling dizzy with a sensation that the room is spinning around him. Normal brain MRI.   Clinical Impression  Orders Received, Chart Reviewed. Pt supine in bed, agreeable to PT evaluation this date. Pt very lethargic throughout evaluation. Patient verbalizes at baseline is Mod I for ADLs and Mobility with use of RW since back surgery. Patient lives alone in single level home, can have assist from family/friends PRN.   Patient was supervision for bed mobility, patient with increased symptoms with functional mobility. Orthostatic vitals assessed with mild drop in systolic. Patient able to ambulate with CGA and use of RW, did require Min A to stand from low toilet seat height due to fatigue. Ambulation distance limited due to fatigue/nausea this date. With vestibular assessment, patient very symptomatic with Slow VOR and Smooth Pursuits. Completed positional testing assessment, with patient most symptomatic with Right Roll Test. However unable to visualize/assess for nystagmus due to significant nausea/vomiting and patient terminating test. Patient with increased dizziness throughout session limiting tolerance for functional activities. Patient will benefit from skilled acute PT services to address functional impairments (see below for additional) and maximize functional mobility.  Anticipate the need for follow up PT services upon acute hospital discharge. Will continue to follow acutely.         If plan is discharge home, recommend the following: A little help with walking and/or transfers;A little help with bathing/dressing/bathroom;Assist for transportation   Can travel by private vehicle   Yes    Equipment Recommendations None recommended by PT  Recommendations for Other Services       Functional Status Assessment Patient has had a recent decline in their functional status and demonstrates the ability to make significant improvements in function in a reasonable and predictable amount of time.     Precautions / Restrictions Precautions Precautions: Back;Fall Required Braces or Orthoses: Other Brace (was wearing TLSO; not present in room.) Restrictions Weight Bearing Restrictions: No      Mobility  Bed Mobility Overal bed mobility: Needs Assistance Bed Mobility: Rolling, Sidelying to Sit, Sit to Sidelying Rolling: Supervision Sidelying to sit: Supervision     Sit to sidelying: Supervision General bed mobility comments: increased time required due ot nausea and fatigue; able to complete without physical assistance. Verbal cues for continued log roll to help with back pain s/p surgery.    Transfers Overall transfer level: Needs assistance Equipment used: Rolling walker (2 wheels) Transfers: Sit to/from Stand Sit to Stand: Supervision, Min assist           General transfer comment: Pt able to stand from EOB wth supervision and use of RW, no physical assistance needed. To stand from low toilet in room, patient require Min A and use of grab bars. Increased dizziness with sit > stand. Orthostatics assesssed (see below for details)    Ambulation/Gait Ambulation/Gait assistance: Contact guard assist Gait Distance (Feet): 25 Feet Assistive device:  Rolling walker (2 wheels) Gait Pattern/deviations: Step-through pattern Gait velocity:  Decreased     General Gait Details: Able to ambulate from bed <> toilet with CGA and use of RW. Limited to approx 25 due to fatigue/nausea.  Stairs            Wheelchair Mobility     Tilt Bed    Modified Rankin (Stroke Patients Only)       Balance Overall balance assessment: Needs assistance Sitting-balance support: Feet supported, No upper extremity supported Sitting balance-Leahy Scale: Good     Standing balance support: Bilateral upper extremity supported, During functional activity, Reliant on assistive device for balance Standing balance-Leahy Scale: Good Standing balance comment: no overt LOB noted with mobility; mild unsteadiness                           VESTIBULAR ASSESSMENT   SYMPTOM BEHAVIOR:  Subjective history: Pt reports significant dizziness/nausea with transitional movements, describes as lightheadedness with some intermittent spinning sensation, as well as associated nausea. Some blurred vision. Denies diplopia or hearing changes. Denies tinnitus.   Non-Vestibular symptoms: nausea/vomiting  Type of dizziness: Blurred Vision, Spinning/Vertigo, and Lightheadedness/Faint  Frequency: daily  Duration: minutes  Aggravating factors: Induced by position change: lying supine, rolling to the right, rolling to the left, supine to sit, and sit to stand and Induced by motion: turning body quickly and turning head quickly  Relieving factors: closing eyes and rest  Progression of symptoms: unchanged  OCULOMOTOR EXAM:  Ocular Alignment: normal  Ocular ROM: No Limitations  Spontaneous Nystagmus: absent  Gaze-Induced Nystagmus: absent  Smooth Pursuits:  mild saccades; increased challenge with tracking due to symptoms and wanting to close eyes with activity.   Saccades: intact   VESTIBULAR - OCULAR REFLEX:   Slow VOR: Comment: increased symptoms, very sensitive to head movement unable to further test VOR  VOR Cancellation: Comment: unable to test.    Head-Impulse Test: Unable to test due to sensitivity to head movement with Slow VOR.    POSITIONAL TESTING: Right Roll Test: symptoms worse on right side and patient unable to maintain eyes open for nystagmus observation, and significant nausea therefore patient terminated test early. Will benefit from reassessment Left Roll Test: no nystagmus Right Sidelying: no nystagmus Left Sidelying: no nystagmus   OTHOSTATICS: see general comments below for orthostatic details.    Pertinent Vitals/Pain Pain Assessment Pain Assessment: 0-10 Pain Score: 5  Pain Location: Back Pain Pain Descriptors / Indicators: Aching, Moaning, Sharp, Guarding, Grimacing Pain Intervention(s): Limited activity within patient's tolerance, Monitored during session    Home Living Family/patient expects to be discharged to:: Private residence Living Arrangements: Alone Available Help at Discharge: Family;Friend(s);Available PRN/intermittently Type of Home: House Home Access: Level entry       Home Layout: One level Home Equipment: Agricultural consultant (2 wheels)      Prior Function Prior Level of Function : Independent/Modified Independent;History of Falls (last six months)             Mobility Comments: Endorses fall history d/t syncope/passing out. Pt has been using RW since back surgery. Reports increasing fatigue. ADLs Comments: Indep with ADL and IADL including driving     Extremity/Trunk Assessment   Upper Extremity Assessment Upper Extremity Assessment: Overall WFL for tasks assessed    Lower Extremity Assessment Lower Extremity Assessment: Generalized weakness       Communication   Communication Communication: No apparent difficulties  Cognition Arousal: Alert Behavior During  Therapy: WFL for tasks assessed/performed Overall Cognitive Status: Within Functional Limits for tasks assessed                                 General Comments: Pt is A & O and agreeable to PT  session thisd ate.        General Comments General comments (skin integrity, edema, etc.): Assessed Orthostatic Vitals due ot patient symptomatic with supine > sit and sit > stand. BP supine: 139/79, HR: 85, Seated: 122/71, HR: 88 (symptomatic, increased dizziness/nausea), Standing: 119/78, HR: 92 (symptomatic, increased dizziness/nausea. continued to increase with prolonged standing).    Exercises     Assessment/Plan    PT Assessment Patient needs continued PT services  PT Problem List Decreased strength;Pain;Decreased activity tolerance;Decreased knowledge of use of DME;Decreased balance;Decreased mobility;Decreased knowledge of precautions       PT Treatment Interventions DME instruction;Gait training;Functional mobility training;Therapeutic activities;Therapeutic exercise;Balance training;Neuromuscular re-education    PT Goals (Current goals can be found in the Care Plan section)  Acute Rehab PT Goals Patient Stated Goal: Get Home PT Goal Formulation: With patient Time For Goal Achievement: 12/08/23 Potential to Achieve Goals: Good Additional Goals Additional Goal #1: Pt will verbalize 25% improvement in dizziness with functional transfers including sit > stand and supine > sit to demonstrate impved symptoms and improved otlerance for functional activities.    Frequency Min 1X/week     Co-evaluation               AM-PAC PT "6 Clicks" Mobility  Outcome Measure Help needed turning from your back to your side while in a flat bed without using bedrails?: A Little Help needed moving from lying on your back to sitting on the side of a flat bed without using bedrails?: A Little Help needed moving to and from a bed to a chair (including a wheelchair)?: A Little Help needed standing up from a chair using your arms (e.g., wheelchair or bedside chair)?: A Little Help needed to walk in hospital room?: A Little Help needed climbing 3-5 steps with a railing? : A Little 6 Click  Score: 18    End of Session Equipment Utilized During Treatment: Gait belt Activity Tolerance: Other (comment) (Limited secondary to nausea/dizziness) Patient left: in bed;with call bell/phone within reach Nurse Communication: Mobility status PT Visit Diagnosis: Dizziness and giddiness (R42);Difficulty in walking, not elsewhere classified (R26.2);Muscle weakness (generalized) (M62.81);History of falling (Z91.81);Pain Pain - part of body:  (Low Back)    Time: 9147-8295 PT Time Calculation (min) (ACUTE ONLY): 43 min   Charges:   PT Evaluation $PT Eval Moderate Complexity: 1 Mod PT Treatments $Therapeutic Activity: 8-22 mins PT General Charges $$ ACUTE PT VISIT: 1 Visit         Creed Copper Fairly, PT, DPT 11/24/23 9:51 AM

## 2023-11-25 ENCOUNTER — Inpatient Hospital Stay: Payer: Medicaid Other | Admitting: Physician Assistant

## 2023-11-25 DIAGNOSIS — Z09 Encounter for follow-up examination after completed treatment for conditions other than malignant neoplasm: Secondary | ICD-10-CM

## 2023-11-25 DIAGNOSIS — R42 Dizziness and giddiness: Secondary | ICD-10-CM | POA: Diagnosis not present

## 2023-11-25 LAB — BASIC METABOLIC PANEL
Anion gap: 9 (ref 5–15)
BUN: 16 mg/dL (ref 8–23)
CO2: 22 mmol/L (ref 22–32)
Calcium: 8.3 mg/dL — ABNORMAL LOW (ref 8.9–10.3)
Chloride: 103 mmol/L (ref 98–111)
Creatinine, Ser: 1.26 mg/dL — ABNORMAL HIGH (ref 0.61–1.24)
GFR, Estimated: >60 mL/min (ref >60)
Glucose, Bld: 91 mg/dL (ref 70–99)
Potassium: 3.6 mmol/L (ref 3.5–5.1)
Sodium: 134 mmol/L — ABNORMAL LOW (ref 135–145)

## 2023-11-25 LAB — CBC
HCT: 35.8 % — ABNORMAL LOW (ref 39.0–52.0)
Hemoglobin: 11.9 g/dL — ABNORMAL LOW (ref 13.0–17.0)
MCH: 29.5 pg (ref 26.0–34.0)
MCHC: 33.2 g/dL (ref 30.0–36.0)
MCV: 88.8 fL (ref 80.0–100.0)
Platelets: 244 10*3/uL (ref 150–400)
RBC: 4.03 MIL/uL — ABNORMAL LOW (ref 4.22–5.81)
RDW: 15.4 % (ref 11.5–15.5)
WBC: 3.4 10*3/uL — ABNORMAL LOW (ref 4.0–10.5)
nRBC: 0 % (ref 0.0–0.2)

## 2023-11-25 MED ORDER — MIDODRINE HCL 5 MG PO TABS: 5.0000 mg | ORAL_TABLET | Freq: Three times a day (TID) | ORAL | 2 refills | Status: DC

## 2023-11-25 NOTE — TOC Transition Note (Signed)
Transition of Care [facility]) - CM/SW Discharge Note   Patient Details  Name: Jonathon Snow MRN: [id number] Date of Birth: January 21, 1961  [facility] CM/SW Contact:  Allena Katz, LCSW Phone Number: 11/25/2023, 11:21 AM   Clinical Narrative:   Pt to discharge today. Pt reports he would like HH and has no preference. [place] with [facility] has accepted referral for PT/OT/RN.     Final next level of care: Home w Home Health Services Barriers to Discharge: Barriers Resolved   Patient Goals and CMS Choice CMS Medicare.gov Compare Post Acute Care list provided to:: Patient    Discharge Placement                         Discharge Plan and Services Additional resources added to the After Visit Summary for                            HH Arranged: RN, PT, OT [facility] Agency: [facility] Date [facility] Agency Contacted: 11/25/23 Time HH Agency Contacted: 1100 Representative spoke with at [facility] Agency: [place]  Social Determinants of Health (SDOH) Interventions SDOH Screenings   Food Insecurity: No Food Insecurity (11/24/2023)  Housing: Low Risk  (11/24/2023)  Transportation Needs: No Transportation Needs (11/24/2023)  Utilities: Not At Risk (11/24/2023)  Alcohol Screen: Low Risk  (10/28/2023)  Depression (PHQ2-9): Low Risk  (10/28/2023)  Social Connections: Unknown (04/25/2022)   Received from [facility]  Tobacco Use: Low Risk  (11/24/2023)     Readmission Risk Interventions     No data to display

## 2023-11-25 NOTE — Plan of Care (Signed)

## 2023-11-25 NOTE — Discharge Summary (Signed)
[facility] Discharge Summary   Patient: Jonathon Snow NUU:[id number]  PCP: Danelle Berry, PA-C  Date of admission: 11/23/2023   Date of discharge:  11/25/2023     Discharge Diagnoses:   Principal Problem: Orthostatic hypotension  Active Problems:    Vertigo   Chest pain   CAD (coronary artery disease)   Nausea & vomiting   COPD, moderate (HCC)   Systemic lupus erythematosus (HCC)   HTN (hypertension)   HLD (hyperlipidemia)   Stage 3a chronic kidney disease (HCC)   Closed T10 spinal fracture (HCC)   History of pulmonary embolism   History of DVT (deep vein thrombosis)   Cerebral venous sinus thrombosis   Depression with anxiety   Overweight (BMI 25.0-29.9)   Chronic diastolic CHF (congestive heart failure) (HCC)   BPH (benign prostatic hyperplasia)   Admitted From: Home Disposition:  Home   Recommendations for Outpatient Follow-up:  Follow with PCP in 1 week, continue to monitor BP and follow with PCP to titrate medication accordingly.  Patient had orthostatic hypotension, discontinued amlodipine and losartan.  Continued Cialis with caution to avoid if low blood pressure.  Started midodrine 5 mg p.o. 3 times daily with holding parameters to skip the dose if SBP greater than 140 mmHg.  Patient is on Flomax, if persistent symptoms then it can be discontinued Follow-up with PCP for further management as an outpatient Follow up LABS/TEST: As above   Follow-up Information     Danelle Berry, PA-C Follow up in 1 week(s).   Specialty: Family Medicine Contact information: [address] [name] [address] [phone]                Diet recommendation: Cardiac diet  Activity: The patient is advised to gradually reintroduce usual activities, as tolerated  Discharge Condition: stable  Code Status: Full code   History of present illness: As per the H and P dictated on admission Hospital Course:  Jonathon Snow is a 62 y.o. male with medical  history significant of CAD, recent STMEI with stent placement, dCHF, HTN, HDL, COPD, CKD-3a, cerebral venous sinus thrombosis and PE/DVT on Eliquis, s/p of IVC filter placement, lupus, lymphedema, chronic venous insufficiency, who presents with dizziness and chest pain.   Patient was recently hospitalized from 11/14 - 11/22 due to STEMI and syncope.  Patient had successful LCx stenting 11/14. Also had unstable thoracic spine fracture of T10. Pt was s/p T7-12 posterior fusion 11/15.   Pt states that he has been feeling dizzy for more than 1 week.  He feels the room spinning around him.  The dizziness is worse when he turns around or sitting up. Pt was seen by PA of neurosurgery today, was sent to ED for further evaluation and treatment d/t nausea/vomiting in the office   Hospital course / significant events:  12/02: admitted to hospitalist service for vertigo and CP r/o, also AKI. MRI brain no concerns. Troponins flat.  12/03: persistent vertigo w/ positional changes - concern for fall risk. PT/OT eval. Starting scheduled meclizine  ASSESSMENT & PLAN:   # Orthostatic hypotension Patient presented with vertigo and feeling dizzy with changing position especially standing. MRI of brain is negative. S/p meclizine, no improvement. Held Flomax and amlodipine.  Gabapentin dose was decreased during hospital stay. vestibular physical therapy ruled out peripheral vertigo.  Before discharge orthostatics were done and patient was positive of, BP dropped while standing and patient was feeling dizzy.  Discontinued amlodipine and losartan.  Continued metoprolol XL 12.5  mg p.o. daily with holding parameters and patient was advised to take midodrine 5 mg p.o. 3 times daily to keep blood pressure around 140.  Follow with PCP for further management as an outpatient.  Patient verbalized understanding and agreed with the discharge planning.   # Chest pain and CAD (coronary artery disease):  s/p recent  LCx stenting. prn  oxycodone for pain home med Continue Crestor, Plavix, beta blocker.  Discontinued amlodipine and losartan due to orthostatic hypotension and AKI on admission. # Nausea & vomiting: Likely due to vertigo.  No abdominal pain or diarrhea. S/p prn zofran, IV fluid d/c, he is tolerating po  # AKI on CKD3a, resolved S/p IV fluids.discontinued Cozaar due to worsening renal function and orthostatic hypotension.  # COPD, moderate: Continued Bronchodilators, Singulair, prn Mucinex # Systemic lupus erythematosus: states that his CellCept dose was decreased from 1000 to 500 mg twice daily.  His prednisone was discontinued.  Continued CellCept  # HTN (hypertension): Patient has orthostatic hypotension: Resumed Toprol-XL 12.5 mg p.o. daily home dose, discontinued Cozaar due to AKI and low blood pressure, discontinued amlodipine.  Started midodrine 5 mg p.o. 3 times daily with holding parameters.  Patient was advised to monitor BP and follow with PCP to titrate medication accordingly. # HLD (hyperlipidemia) continue Crestor 20 mg home dose # Closed T10 spinal fracture (HCC): Pt was s/p T7-12 posterior fusion 11/15.  Continued prn Percocet and Robaxin # Skin staples removal - procedure: Removed 7 staples which were not removed at neurosurgery office were removed by Dr Lyn Hollingshead on 12/3  with topical lidocaine analgesia given that some were buried. NO complications  # Mild cellulitis at surgical site: No sepsis, s/p Keflex  # History of pulmonary embolism and History of DVT (deep vein thrombosis) Continue Eliquis # History of cerebral venous sinus thrombosis: Continue Eliquis # Depression with anxiety: Continue home medications # Chronic diastolic CHF: 2D echo on 11/06/2023 showed EF of 55-60% with grade 1 diastolic dysfunction.  Patient does not have leg edema JVD.  BNP normal 23.  CHF is compensated.  # BPH: Held Flomax during hospital stay, resumed on discharge to take at nighttime.  Skip the dose if symptomatic  with low blood pressure # Overweight Body mass index is 28.49 kg/m.  Nutrition Interventions: Calorie restricted diet and daily exercise advised to lose body weight.  Lifestyle modification discussed.  - Patient was instructed, not to drive, operate heavy machinery, perform activities at heights, swimming or participation in water activities or provide baby sitting services while on Pain, Sleep and Anxiety Medications; until his outpatient Physician has advised to do so again.  - Also recommended to not to take more than prescribed Pain, Sleep and Anxiety Medications.  Patient was seen by physical therapy, who recommended Home health, which was arranged. On the day of the discharge the patient's vitals were stable, and no other acute medical condition were reported by patient. the patient was felt safe to be discharge at Home with Home health.  Consultants: None Procedures: N-one  Discharge Exam: General: Appear in no distress, no Rash; Oral Mucosa Clear, moist. Cardiovascular: S1 and S2 Present, no Murmur, Respiratory: normal respiratory effort, Bilateral Air entry present and no Crackles, no wheezes Abdomen: Bowel Sound present, Soft and no tenderness, no hernia Extremities: no Pedal edema, no calf tenderness Neurology: alert and oriented to time, place, and person affect appropriate.  Filed Weights   11/23/23 1503  Weight: 102 kg   Vitals:   11/25/23 1118 11/25/23  1202  BP: (!) 93/58 130/69  Pulse: (!) 106 83  Resp:    Temp:    SpO2:  95%    DISCHARGE MEDICATION: Allergies as of 11/25/2023       Reactions   Enalapril Other (See Comments)   Unknown reaction   Vicodin [hydrocodone-acetaminophen] Hives, Rash   Severe headaches (also) NAME BRAND ONLY PER PT CAN TAKE GENERIC        Medication List     STOP taking these medications    amLODipine 5 MG tablet Commonly known as: NORVASC   losartan 25 MG tablet Commonly known as: COZAAR       TAKE these  medications    albuterol 108 (90 Base) MCG/ACT inhaler Commonly known as: VENTOLIN HFA Inhale 2 puffs into the lungs every 6 (six) hours as needed for wheezing or shortness of breath.   apixaban 2.5 MG Tabs tablet Commonly known as: ELIQUIS Take 1 tablet (2.5 mg total) by mouth 2 (two) times daily.   ARIPiprazole 2 MG tablet Commonly known as: ABILIFY Take 2 mg by mouth daily.   cholecalciferol 25 MCG (1000 UNIT) tablet Commonly known as: VITAMIN D3 Take 1,000 Units by mouth daily.   clonazePAM 0.5 MG tablet Commonly known as: KLONOPIN Take 0.5 mg by mouth daily.   clopidogrel 75 MG tablet Commonly known as: PLAVIX Take 1 tablet (75 mg total) by mouth daily.   gabapentin 100 MG capsule Commonly known as: NEURONTIN Take 2 capsules (200 mg total) by mouth 3 (three) times daily.   hydrOXYzine 25 MG tablet Commonly known as: ATARAX Take 1 tablet (25 mg total) by mouth 2 (two) times daily as needed for anxiety, itching or nausea.   levocetirizine 5 MG tablet Commonly known as: XYZAL Take 1 tablet (5 mg total) by mouth every evening.   lidocaine 5 % Commonly known as: LIDODERM Place 2 patches onto the skin daily. Remove & Discard patch within 12 hours or as directed by MD   methocarbamol 500 MG tablet Commonly known as: ROBAXIN Take 1 tablet (500 mg total) by mouth every 8 (eight) hours as needed for muscle spasms.   metoprolol succinate 25 MG 24 hr tablet Commonly known as: TOPROL-XL Take 0.5 tablets (12.5 mg total) by mouth daily.   midodrine 5 MG tablet Commonly known as: PROAMATINE Take 1 tablet (5 mg total) by mouth 3 (three) times daily with meals. Skip the dose if SBP >140 mmHg   montelukast 10 MG tablet Commonly known as: SINGULAIR Take 1 tablet (10 mg total) by mouth at bedtime.   multivitamin with minerals tablet Take 1 tablet by mouth daily.   mycophenolate 500 MG tablet Commonly known as: CELLCEPT Take 2 tablets (1,000 mg total) by mouth 2 (two)  times daily. HOLD until neurosurgery is ok to restart this (waiting for incision to heal) What changed: how much to take   oxyCODONE 5 MG immediate release tablet Commonly known as: Oxy IR/ROXICODONE Take 1-2 tablets (5-10 mg total) by mouth every 6 (six) hours as needed for moderate pain (pain score 4-6) or severe pain (pain score 7-10).   rosuvastatin 20 MG tablet Commonly known as: CRESTOR Take 1 tablet (20 mg total) by mouth daily.   senna 8.6 MG Tabs tablet Commonly known as: SENOKOT Take 2 tablets (17.2 mg total) by mouth at bedtime. Stop if loose stool   sertraline 100 MG tablet Commonly known as: ZOLOFT Take 1.5 tablets (150 mg total) by mouth daily.   tadalafil 20  MG tablet Commonly known as: CIALIS Take 1 tablet (20 mg total) by mouth daily as needed for erectile dysfunction. Avoid taking it if low blood pressure What changed: additional instructions   tamsulosin 0.4 MG Caps capsule Commonly known as: FLOMAX Take 1 capsule (0.4 mg total) by mouth daily after supper.       Allergies  Allergen Reactions   Enalapril Other (See Comments)    Unknown reaction   Vicodin [Hydrocodone-Acetaminophen] Hives and Rash    Severe headaches (also) NAME BRAND ONLY PER PT CAN TAKE GENERIC   Discharge Instructions     Call MD for:   Complete by: As directed    Persistent orthostatic hypotension and dizziness   Call MD for:  difficulty breathing, headache or visual disturbances   Complete by: As directed    Call MD for:  extreme fatigue   Complete by: As directed    Call MD for:  persistant dizziness or light-headedness   Complete by: As directed    Call MD for:  persistant nausea and vomiting   Complete by: As directed    Call MD for:  temperature >100.4   Complete by: As directed    Diet - low sodium heart healthy   Complete by: As directed    Discharge instructions   Complete by: As directed    Follow with PCP in 1 week, continue to monitor BP and follow with PCP to  titrate medication accordingly.  Patient had orthostatic hypotension, discontinued amlodipine and losartan.  Continued Cialis with caution to avoid if low blood pressure.  Started midodrine 5 mg p.o. 3 times daily with holding parameters to skip the dose if SBP greater than 140 mmHg.  Patient is on Flomax, if persistent symptoms then it can be discontinued Follow-up with PCP for further management as an outpatient.   Increase activity slowly   Complete by: As directed    No wound care   Complete by: As directed        The results of significant diagnostics from this hospitalization (including imaging, microbiology, ancillary and laboratory) are listed below for reference.    Significant Diagnostic Studies: MR BRAIN WO CONTRAST  Result Date: 11/23/2023 CLINICAL DATA:  Dizziness EXAM: MRI HEAD WITHOUT CONTRAST TECHNIQUE: Multiplanar, multiecho pulse sequences of the brain and surrounding structures were obtained without intravenous contrast. COMPARISON:  11/23/2023 head CT FINDINGS: Brain: No acute infarct, mass effect or extra-axial collection. No chronic microhemorrhage or siderosis. Normal white matter signal, parenchymal volume and CSF spaces. The midline structures are normal. Vascular: Normal flow voids. Skull and upper cervical spine: Normal marrow signal. Sinuses/Orbits: Negative. Other: None. IMPRESSION: Normal brain MRI. Electronically Signed   By: Deatra Robinson M.D.   On: 11/23/2023 23:51   CT Angio Chest PE W/Cm &/Or Wo Cm  Result Date: 11/23/2023 CLINICAL DATA:  Pulmonary embolus suspected with high probability. Vomiting, back pain, and chest tightness for a few days. Dizziness. Recent heart catheterization and back surgery. EXAM: CT ANGIOGRAPHY CHEST WITH CONTRAST TECHNIQUE: Multidetector CT imaging of the chest was performed using the standard protocol during bolus administration of intravenous contrast. Multiplanar CT image reconstructions and MIPs were obtained to evaluate the  vascular anatomy. RADIATION DOSE REDUCTION: This exam was performed according to the departmental dose-optimization program which includes automated exposure control, adjustment of the mA and/or kV according to patient size and/or use of iterative reconstruction technique. CONTRAST:  75mL OMNIPAQUE IOHEXOL 350 MG/ML SOLN COMPARISON:  11/05/2023 FINDINGS: Cardiovascular: Technically adequate study with  good opacification of the central and segmental pulmonary arteries and mild motion artifact. No focal filling defects are demonstrated in the pulmonary arteries. No evidence of significant pulmonary embolus. Normal caliber thoracic aorta. Mild aortic and moderate coronary artery calcification. Normal heart size. No pericardial effusions. Mediastinum/Nodes: Small esophageal hiatal hernia. Esophagus is decompressed. No significant lymphadenopathy. Thyroid gland is unremarkable. Lungs/Pleura: Scarring and atelectasis in the lung bases. No airspace disease or consolidation. No pleural effusions. No pneumothorax. Upper Abdomen: Septated cystic lesion in the right lobe of the liver with some coarse calcifications. Lesion measures about 9.2 cm in diameter. No change since prior study. This may represent a biliary cystadenoma or large congenital cyst. Gallbladder appears mildly distended with probable stones present. No gallbladder wall thickening. Musculoskeletal: Postoperative changes with posterior fixation of the lower thoracic spine. Old rib fractures. Review of the MIP images confirms the above findings. IMPRESSION: 1. No evidence of significant pulmonary embolus. 2. No evidence of active pulmonary disease. Scarring in the lung bases. 3. Unchanged appearance of large cystic lesion in the right lobe of the liver, likely benign. No imaging follow-up is indicated. Electronically Signed   By: Burman Nieves M.D.   On: 11/23/2023 20:21   DG Chest 2 View  Result Date: 11/23/2023 CLINICAL DATA:  Vomiting with back pain  and chest tightness. EXAM: CHEST - 2 VIEW COMPARISON:  February 18, 2023 FINDINGS: The heart size and mediastinal contours are within normal limits. Mild atelectasis is seen along the lateral aspect of the right lung base. No pleural effusion or pneumothorax is identified. Postoperative changes are seen within the mid to lower thoracic spine and upper lumbar spine. No acute osseous abnormalities are identified. IMPRESSION: Mild right basilar atelectasis. Electronically Signed   By: Aram Candela M.D.   On: 11/23/2023 18:48   CT Head Wo Contrast  Result Date: 11/23/2023 CLINICAL DATA:  Neuro deficit, acute, stroke suspected. Dizziness. Vomiting. EXAM: CT HEAD WITHOUT CONTRAST TECHNIQUE: Contiguous axial images were obtained from the base of the skull through the vertex without intravenous contrast. RADIATION DOSE REDUCTION: This exam was performed according to the departmental dose-optimization program which includes automated exposure control, adjustment of the mA and/or kV according to patient size and/or use of iterative reconstruction technique. COMPARISON:  Head CT 11/05/2023 FINDINGS: Brain: There is no evidence of an acute infarct, intracranial hemorrhage, mass, midline shift, or extra-axial fluid collection. The ventricles and sulci are normal. 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 Vascular: Calcified atherosclerosis at the skull base. No hyperdense vessel. Skull: No acute fracture or suspicious osseous lesion. Sinuses/Orbits: Small amount of secretions in a posterior right ethmoid air cell. Clear mastoid air cells. Unremarkable orbits. Other: None. IMPRESSION: No evidence of acute intracranial abnormality. Electronically Signed   By: Sebastian Ache M.D.   On: 11/23/2023 17:05   ECHOCARDIOGRAM COMPLETE  Result Date: 11/06/2023    ECHOCARDIOGRAM REPORT   Patient Name:   DARIELL GEMMEL Date of Exam: 11/06/2023 Medical Rec #:  [id number]      Height:        74.0 in Accession #:    [id number]     Weight:       226.6 lb Date of Birth:  Apr 26, 1961     BSA:          2.292 m Patient Age:    61 years       BP:           98/58  mmHg Patient Gender: M              HR:           83 bpm. Exam Location:  [facility] Procedure: 2D Echo, Cardiac Doppler and Color Doppler Indications:     Syncope  History:         Patient has prior history of Echocardiogram examinations, most                  recent 09/10/2021. CAD and Acute MI, COPD,                  Signs/Symptoms:Syncope and Dyspnea; Risk Factors:Hypertension,                  Diabetes and Dyslipidemia. Pulmonary embolus.  Sonographer:     Mikki Harbor Referring Phys:  [phone] CHRISTOPHER END Diagnosing Phys: Debbe Odea MD  Sonographer Comments: Technically difficult study due to poor echo windows, suboptimal subcostal window and suboptimal parasternal window. Image acquisition challenging due to respiratory motion. IMPRESSIONS  1. Left ventricular ejection fraction, by estimation, is 55 to 60%. The left ventricle has normal function. The left ventricle has no regional wall motion abnormalities. There is mild left ventricular hypertrophy. Left ventricular diastolic parameters are consistent with Grade I diastolic dysfunction (impaired relaxation).  2. Right ventricular systolic function is normal. The right ventricular size is mildly enlarged. There is mildly elevated pulmonary artery systolic pressure.  3. The mitral valve is normal in structure. No evidence of mitral valve regurgitation.  4. The aortic valve is grossly normal. Aortic valve regurgitation is not visualized. FINDINGS  Left Ventricle: Left ventricular ejection fraction, by estimation, is 55 to 60%. The left ventricle has normal function. The left ventricle has no regional wall motion abnormalities. The left ventricular internal cavity size was normal in size. There is  mild left ventricular hypertrophy. Left ventricular diastolic parameters are consistent  with Grade I diastolic dysfunction (impaired relaxation). Right Ventricle: The right ventricular size is mildly enlarged. No increase in right ventricular wall thickness. Right ventricular systolic function is normal. There is mildly elevated pulmonary artery systolic pressure. The tricuspid regurgitant velocity is 2.66 m/s, and with an assumed right atrial pressure of 8 mmHg, the estimated right ventricular systolic pressure is 36.3 mmHg. Left Atrium: Left atrial size was normal in size. Right Atrium: Right atrial size was normal in size. Pericardium: There is no evidence of pericardial effusion. Mitral Valve: The mitral valve is normal in structure. No evidence of mitral valve regurgitation. MV peak gradient, 5.7 mmHg. The mean mitral valve gradient is 2.0 mmHg. Tricuspid Valve: The tricuspid valve is normal in structure. Tricuspid valve regurgitation is trivial. Aortic Valve: The aortic valve is grossly normal. Aortic valve regurgitation is not visualized. Aortic valve mean gradient measures 8.0 mmHg. Aortic valve peak gradient measures 11.0 mmHg. Aortic valve area, by VTI measures 1.97 cm. Pulmonic Valve: The pulmonic valve was not well visualized. Pulmonic valve regurgitation is not visualized. Aorta: The aortic root is normal in size and structure. Venous: The inferior vena cava was not well visualized. IAS/Shunts: No atrial level shunt detected by color flow Doppler.  LEFT VENTRICLE PLAX 2D LVIDd:         4.10 cm   Diastology LVIDs:         3.00 cm   LV e' medial:    9.20 cm/s LV PW:         1.30 cm   LV E/e' medial:  9.8  LV IVS:        1.30 cm   LV e' lateral:   7.94 cm/s LVOT diam:     2.00 cm   LV E/e' lateral: 11.3 LV SV:         69 LV SV Index:   30 LVOT Area:     3.14 cm  RIGHT VENTRICLE RV Basal diam:  3.85 cm RV Mid diam:    3.70 cm RV S prime:     21.80 cm/s TAPSE (M-mode): 2.3 cm LEFT ATRIUM             Index        RIGHT ATRIUM           Index LA diam:        3.30 cm 1.44 cm/m   RA Area:      16.60 cm LA Vol (A2C):   47.8 ml 20.85 ml/m  RA Volume:   49.50 ml  21.60 ml/m LA Vol (A4C):   87.8 ml 38.31 ml/m LA Biplane Vol: 66.6 ml 29.06 ml/m  AORTIC VALVE                     PULMONIC VALVE AV Area (Vmax):    1.99 cm      PV Vmax:       0.96 m/s AV Area (Vmean):   1.64 cm      PV Peak grad:  3.7 mmHg AV Area (VTI):     1.97 cm AV Vmax:           166.00 cm/s AV Vmean:          131.000 cm/s AV VTI:            0.350 m AV Peak Grad:      11.0 mmHg AV Mean Grad:      8.0 mmHg LVOT Vmax:         105.00 cm/s LVOT Vmean:        68.500 cm/s LVOT VTI:          0.219 m LVOT/AV VTI ratio: 0.63  AORTA Ao Root diam: 3.40 cm MITRAL VALVE                TRICUSPID VALVE MV Area (PHT): 4.65 cm     TR Peak grad:   28.3 mmHg MV Area VTI:   2.85 cm     TR Vmax:        266.00 cm/s MV Peak grad:  5.7 mmHg MV Mean grad:  2.0 mmHg     SHUNTS MV Vmax:       1.19 m/s     Systemic VTI:  0.22 m MV Vmean:      67.3 cm/s    Systemic Diam: 2.00 cm MV Decel Time: 163 msec MV E velocity: 90.10 cm/s MV A velocity: 130.00 cm/s MV E/A ratio:  0.69 Debbe Odea MD Electronically signed by Debbe Odea MD Signature Date/Time: 11/06/2023/5:40:51 PM    Final    DG Thoracic Spine 2 View  Result Date: 11/06/2023 CLINICAL DATA:  Elective surgery. EXAM: THORACIC SPINE 2 VIEWS COMPARISON:  Thoracic spine MRI yesterday FINDINGS: Two fluoroscopic spot views of the thoracic spine as well as intraoperative CT imaging obtained during multilevel thoracic spine fusion. Fluoroscopy time 1 minute 6 seconds. Dose 85.785 mGy. IMPRESSION: Intraoperative fluoroscopy during thoracic spine fusion. Electronically Signed   By: Narda Rutherford M.D.   On: 11/06/2023 16:30   DG C-Arm 1-60 Min-No Report  Result Date: 11/06/2023 Fluoroscopy was utilized by the requesting physician.  No radiographic interpretation.   MR THORACIC SPINE WO CONTRAST  Result Date: 11/06/2023 CLINICAL DATA:  Initial evaluation for acute mid back pain. EXAM: MRI  THORACIC SPINE WITHOUT CONTRAST TECHNIQUE: Multiplanar, multisequence MR imaging of the thoracic spine was performed. No intravenous contrast was administered. COMPARISON:  Prior CT from earlier the same day. FINDINGS: Alignment: Physiologic with preservation of the normal thoracic kyphosis. No listhesis. Vertebrae: Acute transverse fracture extending through the T10 vertebral body again seen. Evidence for extension through the posterior elements on the right (series 19, image 6). Findings consistent with an acute Chance type fracture, and unstable injury by definition. No significant vertebral body height loss. Probable injury/disruption of the anterior longitudinal ligament at this level. Remainder of the major ligamentous structures appear grossly intact. No other acute or recent fracture within the thoracic spine. Mild chronic compression deformity of L1 noted. Bone marrow signal intensity diffusely heterogeneous but overall within normal limits. Few scattered benign hemangiomata noted. No worrisome osseous lesions. No other abnormal marrow edema. Cord: Normal signal and morphology. No evidence for traumatic cord injury. Paraspinal and other soft tissues: Paraspinous soft tissues demonstrate no acute finding. Small layering bilateral pleural effusions. Complex cystic lesion within the partially visualized right hepatic lobe noted, better seen on prior CT. 1.6 cm simple right renal cyst, benign in appearance, no follow-up imaging recommended. Disc levels: Ordinary for age multilevel disc desiccation seen throughout the thoracic spine. No significant disc bulge or focal disc herniation. Mild multilevel facet degeneration. No spinal stenosis. Neural foramina remain patent. IMPRESSION: 1. Acute transverse fracture extending through the T10 vertebral body, with extension through the posterior elements on the right. Finding consistent with an acute "Chance" type fracture, an unstable injury by definition. No  significant vertebral body height loss. Probable injury/disruption of the anterior longitudinal ligament at this level. 2. No other acute traumatic injury within the thoracic spine or spinal cord. 3. Small layering bilateral pleural effusions. Electronically Signed   By: Rise Mu M.D.   On: 11/06/2023 00:57   CT ANGIO CHEST AORTA W/CM &/OR WO/CM  Result Date: 11/06/2023 CLINICAL DATA:  Chest trauma, posterior mediastinal stranding on CT abdomen/pelvis EXAM: CT ANGIOGRAPHY CHEST WITH CONTRAST TECHNIQUE: Multidetector CT imaging of the chest was performed using the standard protocol during bolus administration of intravenous contrast. Multiplanar CT image reconstructions and MIPs were obtained to evaluate the vascular anatomy. RADIATION DOSE REDUCTION: This exam was performed according to the departmental dose-optimization program which includes automated exposure control, adjustment of the mA and/or kV according to patient size and/or use of iterative reconstruction technique. CONTRAST:  OMNIPAQUE IOHEXOL 350 MG/ML SOLN COMPARISON:  MRI thoracic spine dated 11/05/2023. CT abdomen/pelvis dated 11142 than 24. FINDINGS: Cardiovascular: On unenhanced CT, there is no evidence of intramural hematoma. Following contrast administration, there is no evidence of traumatic aortic injury. Mild atherosclerotic calcifications of the aortic arch. Although not tailored for evaluation of the pulmonary arteries, there is no evidence of central pulmonary embolism to the lobar level. The heart is normal in size.  No pericardial effusion. Moderate three-vessel coronary atherosclerosis. Mediastinum/Nodes: No evidence of anterior mediastinal hematoma. No suspicious mediastinal lymphadenopathy. Mild prevertebral stranding/hemorrhage at T10 (series 4/image 46), related to the vertebral body fracture at this level, better evaluated on MR. Visualized thyroid is unremarkable. Lungs/Pleura: Mild dependent atelectasis in  the bilateral lower lobes. No focal consolidation or aspiration. No suspicious pulmonary nodules. No pleural effusion or pneumothorax. Upper  Abdomen: Evaluated on recent CT abdomen/pelvis. Musculoskeletal: Dedicated thoracic spine evaluation (with T10 fracture) is better evaluated on recent MR thoracic spine. Otherwise, no fracture is seen. Review of the MIP images confirms the above findings. IMPRESSION: T10 vertebral body fracture, better evaluated on recent MRI thoracic spine. Associated mild prevertebral stranding/hemorrhage at this level, corresponding to the finding on recent CT abdomen/pelvis. No evidence of traumatic aortic injury. Aortic Atherosclerosis (ICD10-I70.0). Electronically Signed   By: Charline Bills M.D.   On: 11/06/2023 00:55   CT ABDOMEN PELVIS WO CONTRAST  Result Date: 11/05/2023 CLINICAL DATA:  Abdominal trauma, blunt r/o hematoma EXAM: CT ABDOMEN AND PELVIS WITHOUT CONTRAST TECHNIQUE: Multidetector CT imaging of the abdomen and pelvis was performed following the standard protocol without IV contrast. RADIATION DOSE REDUCTION: This exam was performed according to the departmental dose-optimization program which includes automated exposure control, adjustment of the mA and/or kV according to patient size and/or use of iterative reconstruction technique. COMPARISON:  CT abdomen pelvis 03/17/2018, MRI 09/30/2023 FINDINGS: Lower chest: Stranding of the visualized posterior distal mediastinum. Coronary calcification. Mitral annular calcification. Hepatobiliary: Chronic 9.4 x 8.2 cm right posterior hepatic lobe fluid dense lesion with thin septations and associated calcification. Possible vicarious excretion of contrast via the gallbladder. No gallstones, gallbladder wall thickening, or pericholecystic fluid. No biliary dilatation. Pancreas: No focal lesion. Normal pancreatic contour. No surrounding inflammatory changes. No main pancreatic ductal dilatation. Spleen: Normal in size without  focal abnormality. Adrenals/Urinary Tract: No adrenal nodule bilaterally. No nephrolithiasis and no hydronephrosis. No definite contour-deforming renal mass. No ureterolithiasis or hydroureter. Renal excretion of previously administered intravenous contrast. The urinary bladder is unremarkable. Stomach/Bowel: Stomach is within normal limits. No evidence of bowel wall thickening or dilatation. Colonic diverticulosis. Appendix appears normal. Vascular/Lymphatic: No abdominal aorta or iliac aneurysm. Mild atherosclerotic plaque of the aorta and its branches. No abdominal, pelvic, or inguinal lymphadenopathy. Reproductive: The prostate is enlarged measuring up to 4.9 cm. Other: No intraperitoneal free fluid. No intraperitoneal free gas. No organized fluid collection. Musculoskeletal: Left lower anterior abdominal wall subcutaneus soft tissue edema and hematoma formation. Associated subcutaneus soft tissue emphysema. No suspicious lytic or blastic osseous lesions. Acute transverse fracture through the T10 anterior wall and contiguous osteophytes (5:69, 6:68). Query similar finding along the T11 inferior endplate (6:68). Surgical hardware fixation of the left proximal femur. Left L5-S1 posterolateral and anterior surgical hardware. Chronic L4 superior endplate compression fracture status post kyphoplasty. IMPRESSION: 1. Fat stranding of the visualized posterior distal mediastinum. Finding likely related to thoracic spine fracture below. Recommend CT chest with intravenous contrast for further evaluation. Differential diagnosis includes inflammation, aortic injury, esophageal injury. 2. Distraction injury of the thoracic spine. Recommend thoracolumbar MRI for further evaluation of possible underlying ligamentous injury. Acute transverse fracture through the T10 vertebral body anterior wall and contiguous osteophytes. Query similar finding along the T11 inferior endplate. Colonic diverticulosis with no acute diverticulitis.  3. Left lower anterior abdominal wall subcutaneus soft tissue edema and hematoma formation. Associated subcutaneus soft tissue emphysema. No retained radiopaque foreign body. 4. Prostatomegaly. 5.  Aortic Atherosclerosis (ICD10-I70.0). These results will be called to the ordering clinician or representative by the Radiologist Assistant, and communication documented in the PACS or [employer]. Electronically Signed   By: Tish Frederickson M.D.   On: 11/05/2023 22:20   CARDIAC CATHETERIZATION  Result Date: 11/05/2023 Conclusions: Severe multivessel coronary artery disease, as detailed above.  Culprit lesion for inferior STEMI appears to be hazy 80-90% distal LCx stenosis.  There is also  occlusion of the proximal RCA with bridging and left-to-right collaterals that could not be crossed with a wire suggesting chronic nature.  Moderate to severe but noncritical proximal/mid LAD and proximal ramus disease also present. Normal left ventricular filling pressure (LVEDP 14 mmHg). Successful IVUS-guided PCI to hazy 80-90% distal LCx stenosis using Onyx Frontier 3.0 x 30 mm drug-eluting stent with 0% residual stenosis and TIMI-3 flow.  First medical contact to balloon time was delayed because of the patient's syncope and head trauma necessitating CT head and cervical spine in the ER prior to transport to the cath lab as well as multiple potential culprit lesions. Recommendations: Admit to ICU for post STEMI monitoring. Begin heparin infusion when next dose of apixaban would be due (approximately 8 PM).  Transition to apixaban as soon as tomorrow if there is no evidence of bleeding or vascular complication. Anticipate triple therapy with aspirin, clopidogrel, and apixaban for 1 month after which time aspirin can be discontinued and clopidogrel and apixaban continued for 12 months. Aggressive secondary prevention of coronary disease, including escalation of rosuvastatin. Consider functional study when patient has  recovered from this hospitalization to assess hemodynamic significance of LAD and ramus intermedius disease. Gentle postcatheterization hydration in the setting of chronic kidney disease. Yvonne Kendall, MD [facility]  CT Head Wo Contrast  Result Date: 11/05/2023 CLINICAL DATA:  Head trauma, coagulopathy (Age 31-64y); Neck trauma, midline tenderness (Age 48-64y) EXAM: CT HEAD WITHOUT CONTRAST CT CERVICAL SPINE WITHOUT CONTRAST TECHNIQUE: Multidetector CT imaging of the head and cervical spine was performed following the standard protocol without intravenous contrast. Multiplanar CT image reconstructions of the cervical spine were also generated. RADIATION DOSE REDUCTION: This exam was performed according to the departmental dose-optimization program which includes automated exposure control, adjustment of the mA and/or kV according to patient size and/or use of iterative reconstruction technique. COMPARISON:  None Available. FINDINGS: CT HEAD FINDINGS Brain: No evidence of acute infarction, hemorrhage, hydrocephalus, extra-axial collection or mass lesion/mass effect. Vascular: No hyperdense vessel. Skull: No acute fracture. Sinuses/Orbits: Clear sinuses.  No acute orbital findings. Other: No mastoid effusions. Heterogeneous appearance of the parotid glands with many small calcifications. CT CERVICAL SPINE FINDINGS Alignment: No substantial sagittal subluxation. Skull base and vertebrae: Mild height loss of upper thoracic vertebral bodies appears similar to CT of the chest from Apr 27, 2017. No evidence of acute fracture. Soft tissues and spinal canal: No prevertebral fluid or swelling. No visible canal hematoma. Disc levels:  Moderate multilevel degenerative change. Upper chest: Visualized lung apices are clear. IMPRESSION: 1. No evidence of acute abnormality intracranially or in the cervical spine. 2. Heterogeneous appearance of the parotid glands with many small calcifications, which suggests chronic  inflammation (such as with Sjogren's). Electronically Signed   By: Feliberto Harts M.D.   On: 11/05/2023 10:43   CT Cervical Spine Wo Contrast  Result Date: 11/05/2023 CLINICAL DATA:  Head trauma, coagulopathy (Age 68-64y); Neck trauma, midline tenderness (Age 46-64y) EXAM: CT HEAD WITHOUT CONTRAST CT CERVICAL SPINE WITHOUT CONTRAST TECHNIQUE: Multidetector CT imaging of the head and cervical spine was performed following the standard protocol without intravenous contrast. Multiplanar CT image reconstructions of the cervical spine were also generated. RADIATION DOSE REDUCTION: This exam was performed according to the departmental dose-optimization program which includes automated exposure control, adjustment of the mA and/or kV according to patient size and/or use of iterative reconstruction technique. COMPARISON:  None Available. FINDINGS: CT HEAD FINDINGS Brain: No evidence of acute infarction, hemorrhage, hydrocephalus, extra-axial collection or mass  lesion/mass effect. Vascular: No hyperdense vessel. Skull: No acute fracture. Sinuses/Orbits: Clear sinuses.  No acute orbital findings. Other: No mastoid effusions. Heterogeneous appearance of the parotid glands with many small calcifications. CT CERVICAL SPINE FINDINGS Alignment: No substantial sagittal subluxation. Skull base and vertebrae: Mild height loss of upper thoracic vertebral bodies appears similar to CT of the chest from Apr 27, 2017. No evidence of acute fracture. Soft tissues and spinal canal: No prevertebral fluid or swelling. No visible canal hematoma. Disc levels:  Moderate multilevel degenerative change. Upper chest: Visualized lung apices are clear. IMPRESSION: 1. No evidence of acute abnormality intracranially or in the cervical spine. 2. Heterogeneous appearance of the parotid glands with many small calcifications, which suggests chronic inflammation (such as with Sjogren's). Electronically Signed   By: Feliberto Harts M.D.   On:  11/05/2023 10:43    Microbiology: No results found for this or any previous visit (from the past 240 hour(s)).   Labs: CBC: Recent Labs  Lab 11/23/23 1505 11/24/23 0442 11/25/23 0450  WBC 6.3 4.6 3.4*  HGB 13.7 11.2* 11.9*  HCT 42.4 34.8* 35.8*  MCV 90.4 90.9 88.8  PLT 343 255 244   Basic Metabolic Panel: Recent Labs  Lab 11/23/23 1505 11/24/23 0442 11/25/23 0450  NA 132* 134* 134*  K 4.4 4.1 3.6  CL 99 103 103  CO2 16* 19* 22  GLUCOSE 91 78 91  BUN 18 18 16   CREATININE 1.72* 1.57* 1.26*  CALCIUM 9.3 8.4* 8.3*   Liver Function Tests: Recent Labs  Lab 11/24/23 0442  AST 15  ALT 11  ALKPHOS 207*  BILITOT 1.2*  PROT 6.1*  ALBUMIN 2.9*   No results for input(s): "LIPASE", "AMYLASE" in the last 168 hours. No results for input(s): "AMMONIA" in the last 168 hours. Cardiac Enzymes: No results for input(s): "CKTOTAL", "CKMB", "CKMBINDEX", "TROPONINI" in the last 168 hours. BNP (last 3 results) Recent Labs    11/23/23 1505  BNP 23.1   CBG: No results for input(s): "GLUCAP" in the last 168 hours.  Time spent: 35 minutes  Signed:  Gillis Santa  [facility] 11/25/2023 3:45 PM

## 2023-11-25 NOTE — Progress Notes (Signed)
Physical Therapy Treatment Patient Details Name: Jonathon Snow MRN: [id number] DOB: [birth year] Today's Date: 11/25/2023   History of Present Illness [name] is a 62 y.o. 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 Patient was recently hospitalized from 11/14 - 11/22 due to STEMI and syncope.  Patient had successful LCx stenting 11/14. Also had unstable thoracic spine fracture of T10. Pt was s/p T7-12 posterior fusion 11/15. He states that since he left the hospital he has been feeling dizzy with a sensation that the room is spinning around him. Normal brain MRI.    PT Comments  Pt pleasant and engaged with PT, subjective information gathering regarding nausea/vertigo leans toward BPPV but testing R and L DixHallpike appeared negative; performed R Eply 2/2 to clinical presentation.  Pt was able to tolerate a prolonged bout of ambulation today (with FWW) with out vestibular, low back, etc symptoms and through slower than his baseline did not have any overt safety concerns or excessive fatigue.  Pt with good overall effort, will benefit from continued PT to address functional limitations.     If plan is discharge home, recommend the following: A little help with walking and/or transfers;A little help with bathing/dressing/bathroom;Assist for transportation   Can travel by private vehicle     Yes  Equipment Recommendations  None recommended by PT    Recommendations for Other Services       Precautions / Restrictions Precautions Precautions: Back;Fall Restrictions Weight Bearing Restrictions: No     Mobility  Bed Mobility Overal bed mobility: Needs Assistance Bed Mobility: Rolling, Sidelying to Sit, Sit to Sidelying Rolling: Supervision Sidelying to sit: Supervision Supine to sit: Supervision, Used rails     General bed mobility  comments: able to complete without physical assistance, cuing for continued log roll to help with back pain s/p surgery.    Transfers Overall transfer level: Needs assistance Equipment used: Rolling walker (2 wheels) Transfers: Sit to/from Stand Sit to Stand: Supervision, Contact guard assist           General transfer comment: from standard height bed pt showed great effort in getting to standing, needed to use UEs on legs but did so w/o assist.  No dizziness with transition    Ambulation/Gait Ambulation/Gait assistance: Contact guard assist Gait Distance (Feet): 200 Feet Assistive device: Rolling walker (2 wheels)         General Gait Details: Pt with slow but safe ambualtion around nurses' station with FWW.  He reports some fatigue but vitals remained appropriate and he did not have any overt SOB or endorse any symptoms of vertigo or nausea   Stairs             Wheelchair Mobility     Tilt Bed    Modified Rankin (Stroke Patients Only)       Balance Overall balance assessment: Needs assistance Sitting-balance support: Feet supported, No upper extremity supported Sitting balance-Leahy Scale: Good     Standing balance support: Bilateral upper extremity supported, During functional activity, Reliant on assistive device for balance Standing balance-Leahy Scale: Good Standing balance comment: need for walker, no unsteadiness                            Cognition Arousal: Alert Behavior During Therapy: WFL for tasks assessed/performed Overall Cognitive Status: Within Functional Limits  for tasks assessed                                          Exercises      General Comments General comments (skin integrity, edema, etc.): Pt reports short lasting, room spinning dizziness that does appear positional and started after head trauma ~2 weeks ago.  Testing for both R and L DixHallpike with no nystagmus and only very limited/transient  "dizziness" that was not vertiginous or nauseating.  Per lack of signs but prior PT notes performed Eply for R.      Pertinent Vitals/Pain Pain Assessment Pain Assessment: No/denies pain    Home Living                          Prior Function            PT Goals (current goals can now be found in the care plan section) Progress towards PT goals: Progressing toward goals    Frequency    Min 1X/week      PT Plan      Co-evaluation              AM-PAC PT "6 Clicks" Mobility   Outcome Measure  Help needed turning from your back to your side while in a flat bed without using bedrails?: A Little Help needed moving from lying on your back to sitting on the side of a flat bed without using bedrails?: A Little Help needed moving to and from a bed to a chair (including a wheelchair)?: A Little Help needed standing up from a chair using your arms (e.g., wheelchair or bedside chair)?: A Little Help needed to walk in hospital room?: A Little Help needed climbing 3-5 steps with a railing? : A Little 6 Click Score: 18    End of Session Equipment Utilized During Treatment: Gait belt Activity Tolerance: Other (comment) Patient left: in bed;with call bell/phone within reach Nurse Communication: Mobility status PT Visit Diagnosis: Dizziness and giddiness (R42);Difficulty in walking, not elsewhere classified (R26.2);Muscle weakness (generalized) (M62.81);History of falling (Z91.81);Pain Pain - part of body:  (lumbago)     Time: 8295-6213 PT Time Calculation (min) (ACUTE ONLY): 32 min  Charges:    $Therapeutic Activity: 8-22 mins $Canalith Rep Proc: 8-22 mins PT General Charges $$ ACUTE PT VISIT: 1 Visit                     Malachi Pro, DPT 11/25/2023, 1:34 PM

## 2023-11-27 DIAGNOSIS — G8929 Other chronic pain: Secondary | ICD-10-CM | POA: Diagnosis not present

## 2023-11-27 DIAGNOSIS — N39498 Other specified urinary incontinence: Secondary | ICD-10-CM | POA: Diagnosis not present

## 2023-11-27 DIAGNOSIS — F32A Depression, unspecified: Secondary | ICD-10-CM | POA: Diagnosis not present

## 2023-11-27 DIAGNOSIS — M329 Systemic lupus erythematosus, unspecified: Secondary | ICD-10-CM | POA: Diagnosis not present

## 2023-11-27 DIAGNOSIS — Z7901 Long term (current) use of anticoagulants: Secondary | ICD-10-CM | POA: Diagnosis not present

## 2023-11-27 DIAGNOSIS — Z48812 Encounter for surgical aftercare following surgery on the circulatory system: Secondary | ICD-10-CM | POA: Diagnosis not present

## 2023-11-27 DIAGNOSIS — Z86718 Personal history of other venous thrombosis and embolism: Secondary | ICD-10-CM | POA: Diagnosis not present

## 2023-11-27 DIAGNOSIS — E785 Hyperlipidemia, unspecified: Secondary | ICD-10-CM | POA: Diagnosis not present

## 2023-11-27 DIAGNOSIS — I2510 Atherosclerotic heart disease of native coronary artery without angina pectoris: Secondary | ICD-10-CM | POA: Diagnosis not present

## 2023-11-27 DIAGNOSIS — N1831 Chronic kidney disease, stage 3a: Secondary | ICD-10-CM | POA: Diagnosis not present

## 2023-11-27 DIAGNOSIS — N179 Acute kidney failure, unspecified: Secondary | ICD-10-CM | POA: Diagnosis not present

## 2023-11-27 DIAGNOSIS — J449 Chronic obstructive pulmonary disease, unspecified: Secondary | ICD-10-CM | POA: Diagnosis not present

## 2023-11-27 DIAGNOSIS — Z86711 Personal history of pulmonary embolism: Secondary | ICD-10-CM | POA: Diagnosis not present

## 2023-11-27 DIAGNOSIS — N401 Enlarged prostate with lower urinary tract symptoms: Secondary | ICD-10-CM | POA: Diagnosis not present

## 2023-11-27 DIAGNOSIS — M810 Age-related osteoporosis without current pathological fracture: Secondary | ICD-10-CM | POA: Diagnosis not present

## 2023-11-27 DIAGNOSIS — I130 Hypertensive heart and chronic kidney disease with heart failure and stage 1 through stage 4 chronic kidney disease, or unspecified chronic kidney disease: Secondary | ICD-10-CM | POA: Diagnosis not present

## 2023-11-27 DIAGNOSIS — I5032 Chronic diastolic (congestive) heart failure: Secondary | ICD-10-CM | POA: Diagnosis not present

## 2023-11-27 DIAGNOSIS — F419 Anxiety disorder, unspecified: Secondary | ICD-10-CM | POA: Diagnosis not present

## 2023-11-27 DIAGNOSIS — S22079D Unspecified fracture of T9-T10 vertebra, subsequent encounter for fracture with routine healing: Secondary | ICD-10-CM | POA: Diagnosis not present

## 2023-11-27 DIAGNOSIS — K219 Gastro-esophageal reflux disease without esophagitis: Secondary | ICD-10-CM | POA: Diagnosis not present

## 2023-11-27 DIAGNOSIS — I951 Orthostatic hypotension: Secondary | ICD-10-CM | POA: Diagnosis not present

## 2023-11-27 DIAGNOSIS — Z955 Presence of coronary angioplasty implant and graft: Secondary | ICD-10-CM | POA: Diagnosis not present

## 2023-11-27 DIAGNOSIS — I890 Lymphedema, not elsewhere classified: Secondary | ICD-10-CM | POA: Diagnosis not present

## 2023-11-27 NOTE — Telephone Encounter (Unsigned)
Copied from CRM [id number]. Topic: General - Other >> Nov 27, 2023 12:02 PM [name] wrote: Home Health Verbal Orders - Caller/Agency: [name], RN/[facility]  Callback Number: [phone] Service Requested: Skilled Nursing Frequency: 1 wk x4, , 2 prn Any new concerns about the patient? No

## 2023-11-30 ENCOUNTER — Telehealth: Payer: Self-pay | Admitting: Neurosurgery

## 2023-11-30 DIAGNOSIS — N39498 Other specified urinary incontinence: Secondary | ICD-10-CM | POA: Diagnosis not present

## 2023-11-30 DIAGNOSIS — Z86711 Personal history of pulmonary embolism: Secondary | ICD-10-CM | POA: Diagnosis not present

## 2023-11-30 DIAGNOSIS — I130 Hypertensive heart and chronic kidney disease with heart failure and stage 1 through stage 4 chronic kidney disease, or unspecified chronic kidney disease: Secondary | ICD-10-CM | POA: Diagnosis not present

## 2023-11-30 DIAGNOSIS — K219 Gastro-esophageal reflux disease without esophagitis: Secondary | ICD-10-CM | POA: Diagnosis not present

## 2023-11-30 DIAGNOSIS — N401 Enlarged prostate with lower urinary tract symptoms: Secondary | ICD-10-CM | POA: Diagnosis not present

## 2023-11-30 DIAGNOSIS — I2510 Atherosclerotic heart disease of native coronary artery without angina pectoris: Secondary | ICD-10-CM | POA: Diagnosis not present

## 2023-11-30 DIAGNOSIS — Z86718 Personal history of other venous thrombosis and embolism: Secondary | ICD-10-CM | POA: Diagnosis not present

## 2023-11-30 DIAGNOSIS — S22079D Unspecified fracture of T9-T10 vertebra, subsequent encounter for fracture with routine healing: Secondary | ICD-10-CM | POA: Diagnosis not present

## 2023-11-30 DIAGNOSIS — J449 Chronic obstructive pulmonary disease, unspecified: Secondary | ICD-10-CM | POA: Diagnosis not present

## 2023-11-30 DIAGNOSIS — I951 Orthostatic hypotension: Secondary | ICD-10-CM | POA: Diagnosis not present

## 2023-11-30 DIAGNOSIS — F32A Depression, unspecified: Secondary | ICD-10-CM | POA: Diagnosis not present

## 2023-11-30 DIAGNOSIS — M810 Age-related osteoporosis without current pathological fracture: Secondary | ICD-10-CM | POA: Diagnosis not present

## 2023-11-30 DIAGNOSIS — M329 Systemic lupus erythematosus, unspecified: Secondary | ICD-10-CM | POA: Diagnosis not present

## 2023-11-30 DIAGNOSIS — E785 Hyperlipidemia, unspecified: Secondary | ICD-10-CM | POA: Diagnosis not present

## 2023-11-30 DIAGNOSIS — Z7901 Long term (current) use of anticoagulants: Secondary | ICD-10-CM | POA: Diagnosis not present

## 2023-11-30 DIAGNOSIS — I5032 Chronic diastolic (congestive) heart failure: Secondary | ICD-10-CM | POA: Diagnosis not present

## 2023-11-30 DIAGNOSIS — N1831 Chronic kidney disease, stage 3a: Secondary | ICD-10-CM | POA: Diagnosis not present

## 2023-11-30 DIAGNOSIS — Z48812 Encounter for surgical aftercare following surgery on the circulatory system: Secondary | ICD-10-CM | POA: Diagnosis not present

## 2023-11-30 DIAGNOSIS — I890 Lymphedema, not elsewhere classified: Secondary | ICD-10-CM | POA: Diagnosis not present

## 2023-11-30 DIAGNOSIS — G8929 Other chronic pain: Secondary | ICD-10-CM | POA: Diagnosis not present

## 2023-11-30 DIAGNOSIS — Z955 Presence of coronary angioplasty implant and graft: Secondary | ICD-10-CM | POA: Diagnosis not present

## 2023-11-30 DIAGNOSIS — F419 Anxiety disorder, unspecified: Secondary | ICD-10-CM | POA: Diagnosis not present

## 2023-11-30 DIAGNOSIS — N179 Acute kidney failure, unspecified: Secondary | ICD-10-CM | POA: Diagnosis not present

## 2023-11-30 NOTE — Telephone Encounter (Signed)
Irving Burton said she is aware he had some staples left due to a scab covering some of them. He can come in for a nurse visit one day this week for one of [place] to remove the staple. Let me know what day and time he wants to come in and I will add him to the schedule.

## 2023-11-30 NOTE — Telephone Encounter (Signed)
Phu, from [place] is calling to let our office know that he saw the patient for the first time today and he still has a staple in his incision site.    [phone]

## 2023-11-30 NOTE — Telephone Encounter (Signed)
noted 

## 2023-11-30 NOTE — Telephone Encounter (Signed)
I added him to Thursday at 10:30am, he has to request transportation.

## 2023-12-01 ENCOUNTER — Encounter: Payer: Medicaid Other | Admitting: Occupational Therapy

## 2023-12-01 ENCOUNTER — Telehealth: Payer: Self-pay | Admitting: Family Medicine

## 2023-12-01 DIAGNOSIS — F32A Depression, unspecified: Secondary | ICD-10-CM | POA: Diagnosis not present

## 2023-12-01 DIAGNOSIS — E785 Hyperlipidemia, unspecified: Secondary | ICD-10-CM | POA: Diagnosis not present

## 2023-12-01 DIAGNOSIS — N39498 Other specified urinary incontinence: Secondary | ICD-10-CM | POA: Diagnosis not present

## 2023-12-01 DIAGNOSIS — M810 Age-related osteoporosis without current pathological fracture: Secondary | ICD-10-CM | POA: Diagnosis not present

## 2023-12-01 DIAGNOSIS — Z955 Presence of coronary angioplasty implant and graft: Secondary | ICD-10-CM | POA: Diagnosis not present

## 2023-12-01 DIAGNOSIS — N1831 Chronic kidney disease, stage 3a: Secondary | ICD-10-CM | POA: Diagnosis not present

## 2023-12-01 DIAGNOSIS — I2510 Atherosclerotic heart disease of native coronary artery without angina pectoris: Secondary | ICD-10-CM | POA: Diagnosis not present

## 2023-12-01 DIAGNOSIS — I890 Lymphedema, not elsewhere classified: Secondary | ICD-10-CM | POA: Diagnosis not present

## 2023-12-01 DIAGNOSIS — I951 Orthostatic hypotension: Secondary | ICD-10-CM | POA: Diagnosis not present

## 2023-12-01 DIAGNOSIS — Z86718 Personal history of other venous thrombosis and embolism: Secondary | ICD-10-CM | POA: Diagnosis not present

## 2023-12-01 DIAGNOSIS — N401 Enlarged prostate with lower urinary tract symptoms: Secondary | ICD-10-CM | POA: Diagnosis not present

## 2023-12-01 DIAGNOSIS — I5032 Chronic diastolic (congestive) heart failure: Secondary | ICD-10-CM | POA: Diagnosis not present

## 2023-12-01 DIAGNOSIS — F419 Anxiety disorder, unspecified: Secondary | ICD-10-CM | POA: Diagnosis not present

## 2023-12-01 DIAGNOSIS — Z48812 Encounter for surgical aftercare following surgery on the circulatory system: Secondary | ICD-10-CM | POA: Diagnosis not present

## 2023-12-01 DIAGNOSIS — K219 Gastro-esophageal reflux disease without esophagitis: Secondary | ICD-10-CM | POA: Diagnosis not present

## 2023-12-01 DIAGNOSIS — G8929 Other chronic pain: Secondary | ICD-10-CM | POA: Diagnosis not present

## 2023-12-01 DIAGNOSIS — M329 Systemic lupus erythematosus, unspecified: Secondary | ICD-10-CM | POA: Diagnosis not present

## 2023-12-01 DIAGNOSIS — Z7901 Long term (current) use of anticoagulants: Secondary | ICD-10-CM | POA: Diagnosis not present

## 2023-12-01 DIAGNOSIS — J449 Chronic obstructive pulmonary disease, unspecified: Secondary | ICD-10-CM | POA: Diagnosis not present

## 2023-12-01 DIAGNOSIS — N179 Acute kidney failure, unspecified: Secondary | ICD-10-CM | POA: Diagnosis not present

## 2023-12-01 DIAGNOSIS — I130 Hypertensive heart and chronic kidney disease with heart failure and stage 1 through stage 4 chronic kidney disease, or unspecified chronic kidney disease: Secondary | ICD-10-CM | POA: Diagnosis not present

## 2023-12-01 DIAGNOSIS — Z86711 Personal history of pulmonary embolism: Secondary | ICD-10-CM | POA: Diagnosis not present

## 2023-12-01 DIAGNOSIS — S22079D Unspecified fracture of T9-T10 vertebra, subsequent encounter for fracture with routine healing: Secondary | ICD-10-CM | POA: Diagnosis not present

## 2023-12-01 NOTE — Telephone Encounter (Signed)
Home Health Verbal Orders - Caller/Agency: Phu PT with [facility] Number: [phone] Service Requested: Physical Therapy Frequency:   1w9  Any new concerns about the patient? Yes  Phu called the surgeon yesterday, Dorris Singh noticed that there was a staple left even after his staple removal. He left a message for the surgeon to call him back.

## 2023-12-01 NOTE — Telephone Encounter (Signed)
Home Health Verbal Orders - Caller/Agency: sherene from [facility] Callback Number:  [phone] secure vm  Service Requested: plan of care for OT Frequency: 1x7  Any new concerns about the patient? no

## 2023-12-03 ENCOUNTER — Telehealth: Payer: Self-pay | Admitting: Family Medicine

## 2023-12-03 ENCOUNTER — Ambulatory Visit: Payer: Medicaid Other

## 2023-12-03 NOTE — Telephone Encounter (Signed)
Home Health Verbal Orders - Caller/Agency: Rinaldo Cloud from [facility] Number: [phone] Service Requested: Skilled Nursing Frequency: 1x4 2prn/ twice a month for a month Any new concerns about the patient? no   2nd request, caller says called previously about this on 12/06 and this is urgent

## 2023-12-03 NOTE — Telephone Encounter (Signed)
You seen him last

## 2023-12-03 NOTE — Telephone Encounter (Signed)
Verbal orders given  

## 2023-12-08 ENCOUNTER — Ambulatory Visit: Payer: Medicaid Other

## 2023-12-08 ENCOUNTER — Encounter: Payer: Medicaid Other | Admitting: Occupational Therapy

## 2023-12-09 ENCOUNTER — Ambulatory Visit: Payer: Medicaid Other

## 2023-12-09 DIAGNOSIS — N1831 Chronic kidney disease, stage 3a: Secondary | ICD-10-CM | POA: Diagnosis not present

## 2023-12-09 DIAGNOSIS — S22078D Other fracture of T9-T10 vertebra, subsequent encounter for fracture with routine healing: Secondary | ICD-10-CM

## 2023-12-09 DIAGNOSIS — I890 Lymphedema, not elsewhere classified: Secondary | ICD-10-CM | POA: Diagnosis not present

## 2023-12-09 DIAGNOSIS — M810 Age-related osteoporosis without current pathological fracture: Secondary | ICD-10-CM | POA: Diagnosis not present

## 2023-12-09 DIAGNOSIS — N39498 Other specified urinary incontinence: Secondary | ICD-10-CM | POA: Diagnosis not present

## 2023-12-09 DIAGNOSIS — S22079D Unspecified fracture of T9-T10 vertebra, subsequent encounter for fracture with routine healing: Secondary | ICD-10-CM | POA: Diagnosis not present

## 2023-12-09 DIAGNOSIS — Z48812 Encounter for surgical aftercare following surgery on the circulatory system: Secondary | ICD-10-CM | POA: Diagnosis not present

## 2023-12-09 DIAGNOSIS — I951 Orthostatic hypotension: Secondary | ICD-10-CM | POA: Diagnosis not present

## 2023-12-09 DIAGNOSIS — I130 Hypertensive heart and chronic kidney disease with heart failure and stage 1 through stage 4 chronic kidney disease, or unspecified chronic kidney disease: Secondary | ICD-10-CM | POA: Diagnosis not present

## 2023-12-09 DIAGNOSIS — F32A Depression, unspecified: Secondary | ICD-10-CM | POA: Diagnosis not present

## 2023-12-09 DIAGNOSIS — N401 Enlarged prostate with lower urinary tract symptoms: Secondary | ICD-10-CM | POA: Diagnosis not present

## 2023-12-09 DIAGNOSIS — J449 Chronic obstructive pulmonary disease, unspecified: Secondary | ICD-10-CM | POA: Diagnosis not present

## 2023-12-09 DIAGNOSIS — E785 Hyperlipidemia, unspecified: Secondary | ICD-10-CM | POA: Diagnosis not present

## 2023-12-09 DIAGNOSIS — F419 Anxiety disorder, unspecified: Secondary | ICD-10-CM | POA: Diagnosis not present

## 2023-12-09 DIAGNOSIS — Z86711 Personal history of pulmonary embolism: Secondary | ICD-10-CM | POA: Diagnosis not present

## 2023-12-09 DIAGNOSIS — M329 Systemic lupus erythematosus, unspecified: Secondary | ICD-10-CM | POA: Diagnosis not present

## 2023-12-09 DIAGNOSIS — Z86718 Personal history of other venous thrombosis and embolism: Secondary | ICD-10-CM | POA: Diagnosis not present

## 2023-12-09 DIAGNOSIS — K219 Gastro-esophageal reflux disease without esophagitis: Secondary | ICD-10-CM | POA: Diagnosis not present

## 2023-12-09 DIAGNOSIS — I5032 Chronic diastolic (congestive) heart failure: Secondary | ICD-10-CM | POA: Diagnosis not present

## 2023-12-09 DIAGNOSIS — N179 Acute kidney failure, unspecified: Secondary | ICD-10-CM | POA: Diagnosis not present

## 2023-12-09 DIAGNOSIS — I2510 Atherosclerotic heart disease of native coronary artery without angina pectoris: Secondary | ICD-10-CM | POA: Diagnosis not present

## 2023-12-09 DIAGNOSIS — Z7901 Long term (current) use of anticoagulants: Secondary | ICD-10-CM | POA: Diagnosis not present

## 2023-12-09 DIAGNOSIS — Z09 Encounter for follow-up examination after completed treatment for conditions other than malignant neoplasm: Secondary | ICD-10-CM

## 2023-12-09 DIAGNOSIS — G8929 Other chronic pain: Secondary | ICD-10-CM | POA: Diagnosis not present

## 2023-12-09 DIAGNOSIS — Z955 Presence of coronary angioplasty implant and graft: Secondary | ICD-10-CM | POA: Diagnosis not present

## 2023-12-09 NOTE — Progress Notes (Signed)
Patient is here today for a nurse visit to remove a staples that were unable to be removed at his last visit. He still has complaints of Vertigo and nausea since surgery. I removed 3 staples from his back. Patient tolerated well.There was still redness and some yellow/bloody drainage in a couple area. We have taken pictures of the areas and put them in his chart. I had Dr. Katrinka Blazing look at the area. He recommended the area still be covered. I applied Medi honey to the area and covered them with Primapore bandages. I gave him some bandages and the Medi honey to use at home. Patient was a little concerned about not having someone at home to help him with the bandages. He states PT was coming to his house on Friday and he was going to get them to change the dressing. He has a follow up on 12/22/2023 and we will assess the spots at the visit.

## 2023-12-10 DIAGNOSIS — I5032 Chronic diastolic (congestive) heart failure: Secondary | ICD-10-CM | POA: Diagnosis not present

## 2023-12-10 DIAGNOSIS — N39498 Other specified urinary incontinence: Secondary | ICD-10-CM | POA: Diagnosis not present

## 2023-12-10 DIAGNOSIS — Z48812 Encounter for surgical aftercare following surgery on the circulatory system: Secondary | ICD-10-CM | POA: Diagnosis not present

## 2023-12-10 DIAGNOSIS — F32A Depression, unspecified: Secondary | ICD-10-CM | POA: Diagnosis not present

## 2023-12-10 DIAGNOSIS — Z86718 Personal history of other venous thrombosis and embolism: Secondary | ICD-10-CM | POA: Diagnosis not present

## 2023-12-10 DIAGNOSIS — S22079D Unspecified fracture of T9-T10 vertebra, subsequent encounter for fracture with routine healing: Secondary | ICD-10-CM | POA: Diagnosis not present

## 2023-12-10 DIAGNOSIS — M329 Systemic lupus erythematosus, unspecified: Secondary | ICD-10-CM | POA: Diagnosis not present

## 2023-12-10 DIAGNOSIS — Z7901 Long term (current) use of anticoagulants: Secondary | ICD-10-CM | POA: Diagnosis not present

## 2023-12-10 DIAGNOSIS — N179 Acute kidney failure, unspecified: Secondary | ICD-10-CM | POA: Diagnosis not present

## 2023-12-10 DIAGNOSIS — M810 Age-related osteoporosis without current pathological fracture: Secondary | ICD-10-CM | POA: Diagnosis not present

## 2023-12-10 DIAGNOSIS — I890 Lymphedema, not elsewhere classified: Secondary | ICD-10-CM | POA: Diagnosis not present

## 2023-12-10 DIAGNOSIS — K219 Gastro-esophageal reflux disease without esophagitis: Secondary | ICD-10-CM | POA: Diagnosis not present

## 2023-12-10 DIAGNOSIS — I130 Hypertensive heart and chronic kidney disease with heart failure and stage 1 through stage 4 chronic kidney disease, or unspecified chronic kidney disease: Secondary | ICD-10-CM | POA: Diagnosis not present

## 2023-12-10 DIAGNOSIS — Z86711 Personal history of pulmonary embolism: Secondary | ICD-10-CM | POA: Diagnosis not present

## 2023-12-10 DIAGNOSIS — J449 Chronic obstructive pulmonary disease, unspecified: Secondary | ICD-10-CM | POA: Diagnosis not present

## 2023-12-10 DIAGNOSIS — F419 Anxiety disorder, unspecified: Secondary | ICD-10-CM | POA: Diagnosis not present

## 2023-12-10 DIAGNOSIS — I951 Orthostatic hypotension: Secondary | ICD-10-CM | POA: Diagnosis not present

## 2023-12-10 DIAGNOSIS — Z955 Presence of coronary angioplasty implant and graft: Secondary | ICD-10-CM | POA: Diagnosis not present

## 2023-12-10 DIAGNOSIS — N1831 Chronic kidney disease, stage 3a: Secondary | ICD-10-CM | POA: Diagnosis not present

## 2023-12-10 DIAGNOSIS — E785 Hyperlipidemia, unspecified: Secondary | ICD-10-CM | POA: Diagnosis not present

## 2023-12-10 DIAGNOSIS — G8929 Other chronic pain: Secondary | ICD-10-CM | POA: Diagnosis not present

## 2023-12-10 DIAGNOSIS — I2510 Atherosclerotic heart disease of native coronary artery without angina pectoris: Secondary | ICD-10-CM | POA: Diagnosis not present

## 2023-12-10 DIAGNOSIS — N401 Enlarged prostate with lower urinary tract symptoms: Secondary | ICD-10-CM | POA: Diagnosis not present

## 2023-12-11 ENCOUNTER — Telehealth: Payer: Self-pay | Admitting: Neurosurgery

## 2023-12-11 DIAGNOSIS — F419 Anxiety disorder, unspecified: Secondary | ICD-10-CM | POA: Diagnosis not present

## 2023-12-11 DIAGNOSIS — I5032 Chronic diastolic (congestive) heart failure: Secondary | ICD-10-CM | POA: Diagnosis not present

## 2023-12-11 DIAGNOSIS — M329 Systemic lupus erythematosus, unspecified: Secondary | ICD-10-CM | POA: Diagnosis not present

## 2023-12-11 DIAGNOSIS — I890 Lymphedema, not elsewhere classified: Secondary | ICD-10-CM | POA: Diagnosis not present

## 2023-12-11 DIAGNOSIS — K219 Gastro-esophageal reflux disease without esophagitis: Secondary | ICD-10-CM | POA: Diagnosis not present

## 2023-12-11 DIAGNOSIS — F32A Depression, unspecified: Secondary | ICD-10-CM | POA: Diagnosis not present

## 2023-12-11 DIAGNOSIS — S22079D Unspecified fracture of T9-T10 vertebra, subsequent encounter for fracture with routine healing: Secondary | ICD-10-CM | POA: Diagnosis not present

## 2023-12-11 DIAGNOSIS — I951 Orthostatic hypotension: Secondary | ICD-10-CM | POA: Diagnosis not present

## 2023-12-11 DIAGNOSIS — Z7901 Long term (current) use of anticoagulants: Secondary | ICD-10-CM | POA: Diagnosis not present

## 2023-12-11 DIAGNOSIS — N401 Enlarged prostate with lower urinary tract symptoms: Secondary | ICD-10-CM | POA: Diagnosis not present

## 2023-12-11 DIAGNOSIS — Z955 Presence of coronary angioplasty implant and graft: Secondary | ICD-10-CM | POA: Diagnosis not present

## 2023-12-11 DIAGNOSIS — J449 Chronic obstructive pulmonary disease, unspecified: Secondary | ICD-10-CM | POA: Diagnosis not present

## 2023-12-11 DIAGNOSIS — I2510 Atherosclerotic heart disease of native coronary artery without angina pectoris: Secondary | ICD-10-CM | POA: Diagnosis not present

## 2023-12-11 DIAGNOSIS — N179 Acute kidney failure, unspecified: Secondary | ICD-10-CM | POA: Diagnosis not present

## 2023-12-11 DIAGNOSIS — Z48812 Encounter for surgical aftercare following surgery on the circulatory system: Secondary | ICD-10-CM | POA: Diagnosis not present

## 2023-12-11 DIAGNOSIS — N1831 Chronic kidney disease, stage 3a: Secondary | ICD-10-CM | POA: Diagnosis not present

## 2023-12-11 DIAGNOSIS — I130 Hypertensive heart and chronic kidney disease with heart failure and stage 1 through stage 4 chronic kidney disease, or unspecified chronic kidney disease: Secondary | ICD-10-CM | POA: Diagnosis not present

## 2023-12-11 DIAGNOSIS — E785 Hyperlipidemia, unspecified: Secondary | ICD-10-CM | POA: Diagnosis not present

## 2023-12-11 DIAGNOSIS — G8929 Other chronic pain: Secondary | ICD-10-CM | POA: Diagnosis not present

## 2023-12-11 DIAGNOSIS — Z86711 Personal history of pulmonary embolism: Secondary | ICD-10-CM | POA: Diagnosis not present

## 2023-12-11 DIAGNOSIS — Z86718 Personal history of other venous thrombosis and embolism: Secondary | ICD-10-CM | POA: Diagnosis not present

## 2023-12-11 DIAGNOSIS — M810 Age-related osteoporosis without current pathological fracture: Secondary | ICD-10-CM | POA: Diagnosis not present

## 2023-12-11 DIAGNOSIS — N39498 Other specified urinary incontinence: Secondary | ICD-10-CM | POA: Diagnosis not present

## 2023-12-11 NOTE — Telephone Encounter (Signed)
Patient is calling to let our office know that home health PT came out today to change his bandages and the physical therapist advised the patient to contact our office to let us know that upon changing that bandages that the patient's had enough puss that the therapist was concerned. He also states that the physical therapist states that one of his incisions is open. The therapist also asked the patient why he wasn't placed on antibiotics and there was also question regarding the cream given to put on his wound. The patient was advised by PT that he would need to be seen.

## 2023-12-11 NOTE — Telephone Encounter (Signed)
Discussed/clarified with Flonnie Hailstone, who spoke with Dr Katrinka Blazing and with the patient (while Dr Katrinka Blazing was standing next to her). He did NOT see his physical therapist today; he saw them earlier in the week prior to seeing [place] in office. Dr Katrinka Blazing saw his incisions when he was here in clinic on 12/09/23 and felt they looked OK. There are pictures of them in them chart.

## 2023-12-13 DIAGNOSIS — I5032 Chronic diastolic (congestive) heart failure: Secondary | ICD-10-CM | POA: Diagnosis not present

## 2023-12-13 DIAGNOSIS — Z86711 Personal history of pulmonary embolism: Secondary | ICD-10-CM | POA: Diagnosis not present

## 2023-12-13 DIAGNOSIS — I951 Orthostatic hypotension: Secondary | ICD-10-CM | POA: Diagnosis not present

## 2023-12-13 DIAGNOSIS — I890 Lymphedema, not elsewhere classified: Secondary | ICD-10-CM | POA: Diagnosis not present

## 2023-12-13 DIAGNOSIS — Z48812 Encounter for surgical aftercare following surgery on the circulatory system: Secondary | ICD-10-CM | POA: Diagnosis not present

## 2023-12-13 DIAGNOSIS — M329 Systemic lupus erythematosus, unspecified: Secondary | ICD-10-CM | POA: Diagnosis not present

## 2023-12-13 DIAGNOSIS — N39498 Other specified urinary incontinence: Secondary | ICD-10-CM | POA: Diagnosis not present

## 2023-12-13 DIAGNOSIS — I130 Hypertensive heart and chronic kidney disease with heart failure and stage 1 through stage 4 chronic kidney disease, or unspecified chronic kidney disease: Secondary | ICD-10-CM | POA: Diagnosis not present

## 2023-12-13 DIAGNOSIS — Z955 Presence of coronary angioplasty implant and graft: Secondary | ICD-10-CM | POA: Diagnosis not present

## 2023-12-13 DIAGNOSIS — N179 Acute kidney failure, unspecified: Secondary | ICD-10-CM | POA: Diagnosis not present

## 2023-12-13 DIAGNOSIS — M810 Age-related osteoporosis without current pathological fracture: Secondary | ICD-10-CM | POA: Diagnosis not present

## 2023-12-13 DIAGNOSIS — F419 Anxiety disorder, unspecified: Secondary | ICD-10-CM | POA: Diagnosis not present

## 2023-12-13 DIAGNOSIS — F32A Depression, unspecified: Secondary | ICD-10-CM | POA: Diagnosis not present

## 2023-12-13 DIAGNOSIS — Z7901 Long term (current) use of anticoagulants: Secondary | ICD-10-CM | POA: Diagnosis not present

## 2023-12-13 DIAGNOSIS — E785 Hyperlipidemia, unspecified: Secondary | ICD-10-CM | POA: Diagnosis not present

## 2023-12-13 DIAGNOSIS — G8929 Other chronic pain: Secondary | ICD-10-CM | POA: Diagnosis not present

## 2023-12-13 DIAGNOSIS — Z86718 Personal history of other venous thrombosis and embolism: Secondary | ICD-10-CM | POA: Diagnosis not present

## 2023-12-13 DIAGNOSIS — N1831 Chronic kidney disease, stage 3a: Secondary | ICD-10-CM | POA: Diagnosis not present

## 2023-12-13 DIAGNOSIS — N401 Enlarged prostate with lower urinary tract symptoms: Secondary | ICD-10-CM | POA: Diagnosis not present

## 2023-12-13 DIAGNOSIS — S22079D Unspecified fracture of T9-T10 vertebra, subsequent encounter for fracture with routine healing: Secondary | ICD-10-CM | POA: Diagnosis not present

## 2023-12-13 DIAGNOSIS — K219 Gastro-esophageal reflux disease without esophagitis: Secondary | ICD-10-CM | POA: Diagnosis not present

## 2023-12-13 DIAGNOSIS — I2510 Atherosclerotic heart disease of native coronary artery without angina pectoris: Secondary | ICD-10-CM | POA: Diagnosis not present

## 2023-12-13 DIAGNOSIS — J449 Chronic obstructive pulmonary disease, unspecified: Secondary | ICD-10-CM | POA: Diagnosis not present

## 2023-12-14 ENCOUNTER — Ambulatory Visit (INDEPENDENT_AMBULATORY_CARE_PROVIDER_SITE_OTHER): Payer: Medicaid Other | Admitting: Internal Medicine

## 2023-12-14 ENCOUNTER — Other Ambulatory Visit: Payer: Self-pay

## 2023-12-14 ENCOUNTER — Encounter: Payer: Self-pay | Admitting: Internal Medicine

## 2023-12-14 VITALS — BP 132/84 | HR 102 | Temp 98.0°F | Resp 16 | Ht 74.0 in | Wt 223.0 lb

## 2023-12-14 DIAGNOSIS — B372 Candidiasis of skin and nail: Secondary | ICD-10-CM | POA: Diagnosis not present

## 2023-12-14 DIAGNOSIS — F332 Major depressive disorder, recurrent severe without psychotic features: Secondary | ICD-10-CM | POA: Diagnosis not present

## 2023-12-14 DIAGNOSIS — I2510 Atherosclerotic heart disease of native coronary artery without angina pectoris: Secondary | ICD-10-CM

## 2023-12-14 DIAGNOSIS — I10 Essential (primary) hypertension: Secondary | ICD-10-CM | POA: Diagnosis not present

## 2023-12-14 DIAGNOSIS — Z09 Encounter for follow-up examination after completed treatment for conditions other than malignant neoplasm: Secondary | ICD-10-CM | POA: Diagnosis not present

## 2023-12-14 DIAGNOSIS — J449 Chronic obstructive pulmonary disease, unspecified: Secondary | ICD-10-CM | POA: Diagnosis not present

## 2023-12-14 DIAGNOSIS — S22078D Other fracture of T9-T10 vertebra, subsequent encounter for fracture with routine healing: Secondary | ICD-10-CM

## 2023-12-14 DIAGNOSIS — H8113 Benign paroxysmal vertigo, bilateral: Secondary | ICD-10-CM | POA: Diagnosis not present

## 2023-12-14 DIAGNOSIS — N401 Enlarged prostate with lower urinary tract symptoms: Secondary | ICD-10-CM

## 2023-12-14 DIAGNOSIS — F419 Anxiety disorder, unspecified: Secondary | ICD-10-CM | POA: Diagnosis not present

## 2023-12-14 DIAGNOSIS — E782 Mixed hyperlipidemia: Secondary | ICD-10-CM | POA: Diagnosis not present

## 2023-12-14 MED ORDER — ALBUTEROL SULFATE HFA 108 (90 BASE) MCG/ACT IN AERS: 2.0000 | INHALATION_SPRAY | Freq: Four times a day (QID) | RESPIRATORY_TRACT | 2 refills | Status: DC | PRN

## 2023-12-14 MED ORDER — ROSUVASTATIN CALCIUM 20 MG PO TABS: 20.0000 mg | ORAL_TABLET | Freq: Every day | ORAL | 0 refills | Status: DC

## 2023-12-14 MED ORDER — SERTRALINE HCL 100 MG PO TABS: 150.0000 mg | ORAL_TABLET | Freq: Every day | ORAL | 0 refills | Status: DC

## 2023-12-14 MED ORDER — CLOPIDOGREL BISULFATE 75 MG PO TABS: 75.0000 mg | ORAL_TABLET | Freq: Every day | ORAL | 0 refills | Status: DC

## 2023-12-14 MED ORDER — TAMSULOSIN HCL 0.4 MG PO CAPS: 0.4000 mg | ORAL_CAPSULE | Freq: Every day | ORAL | 0 refills | Status: DC

## 2023-12-14 MED ORDER — NYSTATIN 100000 UNIT/GM EX POWD: 1.0000 | Freq: Three times a day (TID) | CUTANEOUS | 0 refills | Status: DC

## 2023-12-14 MED ORDER — GABAPENTIN 100 MG PO CAPS: 200.0000 mg | ORAL_CAPSULE | Freq: Three times a day (TID) | ORAL | 2 refills | Status: DC

## 2023-12-14 MED ORDER — METHOCARBAMOL 500 MG PO TABS: 500.0000 mg | ORAL_TABLET | Freq: Three times a day (TID) | ORAL | 1 refills | Status: DC | PRN

## 2023-12-14 MED ORDER — METOPROLOL SUCCINATE ER 25 MG PO TB24: 12.5000 mg | ORAL_TABLET | Freq: Every day | ORAL | 1 refills | Status: DC

## 2023-12-14 MED ORDER — NYSTATIN 100000 UNIT/GM EX CREA: 1.0000 | TOPICAL_CREAM | Freq: Two times a day (BID) | CUTANEOUS | 0 refills | Status: DC

## 2023-12-14 NOTE — Assessment & Plan Note (Signed)
Has PT/OT home health, new order to extend home health with home health aid.

## 2023-12-14 NOTE — Assessment & Plan Note (Signed)
Refill statin

## 2023-12-14 NOTE — Assessment & Plan Note (Signed)
Refill Zoloft.

## 2023-12-14 NOTE — Assessment & Plan Note (Signed)
Refill Flomax, discussed how this can lower BP as well.

## 2023-12-14 NOTE — Assessment & Plan Note (Signed)
Recent STEMI with stent placement, has follow up with Cardiology 12/31. Refill Plavix, statin and Metoprolol.

## 2023-12-14 NOTE — Assessment & Plan Note (Signed)
Refill Albuterol

## 2023-12-14 NOTE — Progress Notes (Signed)
Established Patient Office Visit  Subjective   Patient ID: Jonathon Snow, male    DOB: [birth year]  Age: 62 y.o. MRN: [id number]  Chief Complaint  Patient presents with   Hospitalization Follow-up    Heart attack   Back Pain    From fall   Rash    Groin area    HPI  Patient presents today for hospital follow up. He is new to me. Had been previously hospitalized in November for STEMI with stent placement and had also recently had back surgery after a thoracici vertebrae fracture.   Discharge Date: 11/23/23 Diagnosis: Orthostatic hypotension Procedures/tests: MRI brain without abnormalities, troponin negative, creatinine 1.26, calcium 8.3, sodium 134, WBC 3.4, Hgb 11.9 Consultants: PT/OT New medications: Midodrine 5 mg TID with holding parameters (skip dose if SBP >140). Only had to take once or twice. BP will fluctuate depending on position, going low when standing Discontinued medications: Amlodipine and Losartan  Status: fluctuating - patient still having vertigo, especially with moving his head or neck. Very limited in his movements due to recent thoracic surgery and back brace.    Patient Active Problem List   Diagnosis Date Noted   Anxiety 12/14/2023   Vertigo 11/23/2023   Chest pain 11/23/2023   CAD (coronary artery disease) 11/23/2023   HTN (hypertension) 11/23/2023   HLD (hyperlipidemia) 11/23/2023   Depression with anxiety 11/23/2023   Overweight (BMI 25.0-29.9) 11/23/2023   Chronic diastolic CHF (congestive heart failure) (HCC) 11/23/2023   BPH (benign prostatic hyperplasia) 11/23/2023   Acute urinary retention 11/11/2023   Unstable angina (HCC) 11/10/2023   Hematoma of groin 11/08/2023   Closed T10 spinal fracture (HCC) 11/06/2023   Closed tricolumnar fracture of thoracic vertebra (HCC) 11/06/2023   Thoracic spine instability 11/06/2023   Shock circulatory (HCC) 11/06/2023   Heart attack (HCC) 11/05/2023   STEMI (ST elevation myocardial infarction) (HCC)  11/05/2023   Head injury 11/05/2023   Chronic bilateral low back pain with left-sided sciatica 07/07/2023   Severe episode of recurrent major depressive disorder, 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   Neuropathy 07/07/2023   Hx of colonic polyps 07/01/2023   Adenomatous polyp of colon 07/01/2023   SLE (systemic lupus erythematosus related syndrome) (HCC) 02/19/2023   Closed left hip fracture, initial encounter (HCC) 02/18/2023   Pain and swelling of right lower extremity 02/03/2023   Erectile disorder 01/22/2023   Coronary artery disease of native artery of native heart with stable angina pectoris (HCC) 09/09/2022   Current moderate episode of major depressive disorder (HCC) 09/09/2022   Varicose 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 01/03/2022   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) 09/18/2021   Prediabetes 11/08/2020   Stage 3a chronic kidney disease (HCC) 11/08/2020   Seasonal allergies 11/08/2020   Rhinosinusitis 11/08/2020   Long term current use of systemic steroids 11/08/2020   Anticoagulant disorder (HCC) 11/07/2020   Senile purpura (HCC) 11/07/2020   History of DVT (deep vein thrombosis) 03/22/2020   MDD (major depressive disorder), recurrent episode, mild (HCC) 09/12/2019   Other spondylosis with radiculopathy, lumbar region 02/16/2019   Osteoporosis 10/12/2018   Chronic venous insufficiency 10/07/2018   Lymphedema 10/07/2018   SLE glomerulonephritis syndrome, 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HCC) 03/03/2018   Syncope and collapse 12/29/2017   Nausea & vomiting 05/07/2017   Acute renal failure with acute tubular necrosis superimposed on stage 3b chronic kidney disease (HCC) 04/27/2017   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)  10/29/2016   Mixed hyperlipidemia 01/15/2016   Primary hypertension 01/15/2016   Obesity (BMI 30.0-34.9) 01/15/2016   Long term current use of anticoagulant 10/04/2015   Systemic  lupus erythematosus (HCC) 05/30/2015   COPD, moderate (HCC) 06/27/2014   History of pulmonary embolism 12/11/2013   Nodule of right lung 12/11/2013   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   Cystic disease of liver 08/21/2013   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   Overview:  superior sagittal sinus, left transverse sinus and cortical veins    COVID-19 virus infection 12/2020   Depression    DVT (deep venous thrombosis) (HCC)    Dyspnea    GERD (gastroesophageal reflux disease)    Heel spur, left 02/16/2019   Heel spur, right 02/16/2019   Herpes zoster infection of lumbar region 02/20/2020   History of kidney stones    Hyperlipidemia    Hypertension    Lupus    Lymphedema 10/07/2018   Morbid obesity (HCC)    Opiate abuse, episodic (HCC) 02/26/2018   Osteoporosis    Pneumonia    PONV (postoperative nausea and vomiting)    Postphlebitic syndrome with ulcer, 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   Presence of IVC filter 03/22/2020   Removed   Pulmonary embolism (HCC)    Renal disorder    Stage III   Past Surgical History:  Procedure Laterality Date   ANKLE SURGERY Right    BACK SURGERY     BRONCHIAL WASHINGS N/A 11/01/2021   Procedure: BRONCHIAL WASHINGS;  Surgeon: Vida Rigger, MD;  Location: [facility] ORS;  Service: Thoracic;  Laterality: N/A;   COLONOSCOPY WITH PROPOFOL N/A 05/28/2020   Procedure: COLONOSCOPY WITH PROPOFOL;  Surgeon: Wyline Mood, MD;  Location: [facility] ENDOSCOPY;  Service: Endoscopy;  Laterality: N/A;   COLONOSCOPY WITH PROPOFOL N/A 07/01/2023   Procedure: COLONOSCOPY WITH PROPOFOL;  Surgeon: Wyline Mood, MD;  Location: [facility] ENDOSCOPY;  Service: Gastroenterology;  Laterality: N/A;   CORONARY ULTRASOUND/IVUS N/A 11/05/2023   Procedure: Coronary Ultrasound/IVUS;  Surgeon: Yvonne Kendall, MD;  Location: [facility] INVASIVE CV LAB;  Service: Cardiovascular;  Laterality: N/A;   CORONARY/GRAFT ACUTE  MI REVASCULARIZATION N/A 11/05/2023   Procedure: Coronary/Graft Acute MI Revascularization;  Surgeon: Yvonne Kendall, MD;  Location: [facility] INVASIVE CV LAB;  Service: Cardiovascular;  Laterality: N/A;   CYST EXCISION  92 or 93    Liver cyst removal [facility]   FLEXIBLE BRONCHOSCOPY N/A 11/01/2021   Procedure: FLEXIBLE BRONCHOSCOPY;  Surgeon: Vida Rigger, MD;  Location: [facility] ORS;  Service: Thoracic;  Laterality: N/A;   HIP PINNING,CANNULATED Left 02/19/2023   Procedure: PERCUTANEOUS FIXATION OF FEMORAL NECK;  Surgeon: Signa Kell, MD;  Location: [facility] ORS;  Service: Orthopedics;  Laterality: Left;   I & D EXTREMITY Right 04/29/2017   Procedure: IRRIGATION AND DEBRIDEMENT EXTREMITY;  Surgeon: Ricarda Frame, MD;  Location: [facility] ORS;  Service: General;  Laterality: Right;   IRRIGATION AND DEBRIDEMENT ABSCESS Left 04/29/2017   Procedure: IRRIGATION AND DEBRIDEMENT Scrotal ABSCESS;  Surgeon: Ricarda Frame, MD;  Location: [facility] ORS;  Service: General;  Laterality: Left;   LEFT HEART CATH AND CORONARY ANGIOGRAPHY N/A 11/05/2023   Procedure: LEFT HEART CATH AND CORONARY ANGIOGRAPHY;  Surgeon: Yvonne Kendall, MD;  Location: [facility] INVASIVE CV LAB;  Service: Cardiovascular;  Laterality: N/A;   POLYPECTOMY  07/01/2023   Procedure: POLYPECTOMY;  Surgeon: Wyline Mood, MD;  Location: [facility] ENDOSCOPY;  Service: Gastroenterology;;   Social History   Tobacco Use  Smoking status: Never   Smokeless tobacco: Never  Vaping Use   Vaping status: Never Used  Substance Use Topics   Alcohol use: No   Drug use: No    Comment: PT DENIES   Social History   Socioeconomic History   Marital status: Divorced    Spouse name: Not on file   Number of children: Not on file   Years of education: Not on file   Highest education level: Not on file  Occupational History   Not on file  Tobacco Use   Smoking status: Never   Smokeless tobacco: Never  Vaping Use   Vaping status: Never Used  Substance and Sexual  Activity   Alcohol use: No   Drug use: No    Comment: PT DENIES   Sexual activity: Yes  Other Topics Concern   Not on file  Social History Narrative   Not on file   Social Drivers of Health   Financial Resource Strain: Not on file  Food Insecurity: No Food Insecurity (11/24/2023)   Hunger Vital Sign    Worried About Running Out of Food in the Last Year: Never true    Ran Out of Food in the Last Year: Never true  Transportation Needs: No Transportation Needs (11/24/2023)   PRAPARE - Administrator, Civil Service (Medical): No    Lack of Transportation (Non-Medical): No  Physical Activity: Not on file  Stress: Not on file  Social Connections: Unknown (04/25/2022)   Received from [facility]   Social Network    Social Network: Not on file  Intimate Partner Violence: Not At Risk (11/24/2023)   Humiliation, Afraid, Rape, and Kick questionnaire    Fear of Current or Ex-Partner: No    Emotionally Abused: No    Physically Abused: No    Sexually Abused: No   Family Status  Relation Name Status   Father  Deceased   Mother  Deceased   Brother  Alive   Daughter  Alive   Son  Alive   MGM  Deceased   MGF  Deceased   PGM not sure Deceased   PGF  Deceased  No partnership data on file   Family History  Problem Relation Age of Onset   Hypertension Father    Heart disease Father    Clotting disorder Mother    Kidney disease Brother    Heart attack Maternal Grandmother    Heart attack Maternal Grandfather    Heart attack Paternal Grandfather    Allergies  Allergen Reactions   Enalapril Other (See Comments)    Unknown reaction   Vicodin [Hydrocodone-Acetaminophen] Hives and Rash    Severe headaches (also) NAME BRAND ONLY PER PT CAN TAKE GENERIC      Review of Systems  Constitutional:  Negative for chills and fever.  HENT:  Negative for ear pain, sinus pain and tinnitus.   Neurological:  Positive for dizziness.      Objective:     BP 132/84  (Cuff Size: Large)   Pulse (!) 102   Temp 98 F (36.7 C) (Oral)   Resp 16   Ht 6\' 2"  (1.88 m)   Wt 223 lb (101.2 kg)   BMI 28.63 kg/m  BP Readings from Last 3 Encounters:  12/14/23 132/84  11/25/23 130/69  11/23/23 124/80   Wt Readings from Last 3 Encounters:  12/14/23 223 lb (101.2 kg)  11/23/23 224 lb 13.9 oz (102 kg)  11/23/23 226 lb (102.5 kg)  Physical Exam Constitutional:      Appearance: Normal appearance.     Comments: Walking with walker with back brace  HENT:     Head: Normocephalic and atraumatic.     Right Ear: Tympanic membrane, ear canal and external ear normal.     Left Ear: Tympanic membrane, ear canal and external ear normal.  Eyes:     Conjunctiva/sclera: Conjunctivae normal.  Cardiovascular:     Rate and Rhythm: Normal rate and regular rhythm.  Pulmonary:     Effort: Pulmonary effort is normal.     Breath sounds: Normal breath sounds.  Skin:    General: Skin is warm and dry.     Comments: Yeast dermatitis on left inner thigh  Neurological:     General: No focal deficit present.     Mental Status: He is alert. Mental status is at baseline.  Psychiatric:        Mood and Affect: Mood normal.        Behavior: Behavior normal.      No results found for any visits on 12/14/23.  Last CBC Lab Results  Component Value Date   WBC 3.4 (L) 11/25/2023   HGB 11.9 (L) 11/25/2023   HCT 35.8 (L) 11/25/2023   MCV 88.8 11/25/2023   MCH 29.5 11/25/2023   RDW 15.4 11/25/2023   PLT 244 11/25/2023   Last metabolic panel Lab Results  Component Value Date   GLUCOSE 91 11/25/2023   NA 134 (L) 11/25/2023   K 3.6 11/25/2023   CL 103 11/25/2023   CO2 22 11/25/2023   BUN 16 11/25/2023   CREATININE 1.26 (H) 11/25/2023   GFRNONAA >60 11/25/2023   CALCIUM 8.3 (L) 11/25/2023   PHOS 3.8 11/06/2023   PROT 6.1 (L) 11/24/2023   ALBUMIN 2.9 (L) 11/24/2023   LABGLOB 3.0 04/17/2016   AGRATIO 1.3 04/17/2016   BILITOT 1.2 (H) 11/24/2023   ALKPHOS 207 (H)  11/24/2023   AST 15 11/24/2023   ALT 11 11/24/2023   ANIONGAP 9 11/25/2023   Last lipids Lab Results  Component Value Date   CHOL 199 11/05/2023   HDL 59 11/05/2023   LDLCALC 119 (H) 11/05/2023   TRIG 105 11/05/2023   CHOLHDL 3.4 11/05/2023   Last hemoglobin A1c Lab Results  Component Value Date   HGBA1C 6.0 (H) 10/28/2023   Last thyroid functions Lab Results  Component Value Date   TSH 1.06 10/28/2023   Last vitamin D Lab Results  Component Value Date   VD25OH 35 11/15/2018   Last vitamin B12 and Folate Lab Results  Component Value Date   VITAMINB12 274 10/28/2023      The ASCVD Risk score (Arnett DK, et al., 2019) failed to calculate for the following reasons:   Risk score cannot be calculated because patient has a medical history suggesting prior/existing ASCVD    Assessment & Plan:  Hospital discharge follow-up -     Basic metabolic panel -     Ambulatory referral to Home Health  Primary hypertension Assessment & Plan: Blood pressure stable today but going low with standing. Agree with discontinuation of Amlodipine and Losartan, continue Metoprolol 12.5 mg daily. Continue to monitor at home and follow up in 1 month.   Orders: -     Clopidogrel Bisulfate; Take 1 tablet (75 mg total) by mouth daily.  Dispense: 30 tablet; Refill: 0 -     Metoprolol Succinate ER; Take 0.5 tablets (12.5 mg total) by mouth daily.  Dispense: 30 tablet;  Refill: 1  Mixed hyperlipidemia Assessment & Plan: Refill statin.   Orders: -     Rosuvastatin Calcium; Take 1 tablet (20 mg total) by mouth daily.  Dispense: 90 tablet; Refill: 0  Coronary artery disease involving native coronary artery of native heart without angina pectoris Assessment & Plan: Recent STEMI with stent placement, has follow up with Cardiology 12/31. Refill Plavix, statin and Metoprolol.   Orders: -     Clopidogrel Bisulfate; Take 1 tablet (75 mg total) by mouth daily.  Dispense: 30 tablet; Refill: 0 -      Metoprolol Succinate ER; Take 0.5 tablets (12.5 mg total) by mouth daily.  Dispense: 30 tablet; Refill: 1 -     Rosuvastatin Calcium; Take 1 tablet (20 mg total) by mouth daily.  Dispense: 90 tablet; Refill: 0  COPD, moderate (HCC) Assessment & Plan: Refill Albuterol.   Orders: -     Albuterol Sulfate HFA; Inhale 2 puffs into the lungs every 6 (six) hours as needed for wheezing or shortness of breath.  Dispense: 18 g; Refill: 2  Anxiety Assessment & Plan: Refill Zoloft.   Orders: -     Sertraline HCl; Take 1.5 tablets (150 mg total) by mouth daily.  Dispense: 90 tablet; Refill: 0  Severe episode of recurrent major depressive disorder, without psychotic features (HCC) Assessment & Plan: Refill Zoloft.   Orders: -     Sertraline HCl; Take 1.5 tablets (150 mg total) by mouth daily.  Dispense: 90 tablet; Refill: 0  Yeast dermatitis -     Nystatin; Apply 1 Application topically 3 (three) times daily.  Dispense: 15 g; Refill: 0 -     Nystatin; Apply 1 Application topically 2 (two) times daily.  Dispense: 30 g; Refill: 0  Other closed fracture of tenth thoracic vertebra with routine healing, subsequent encounter Assessment & Plan: Has PT/OT home health, new order to extend home health with home health aid.   Orders: -     Gabapentin; Take 2 capsules (200 mg total) by mouth 3 (three) times daily.  Dispense: 180 capsule; Refill: 2 -     Methocarbamol; Take 1 tablet (500 mg total) by mouth every 8 (eight) hours as needed for muscle spasms.  Dispense: 60 tablet; Refill: 1  Benign prostatic hyperplasia with lower urinary tract symptoms, symptom details unspecified Assessment & Plan: Refill Flomax, discussed how this can lower BP as well.   Orders: -     Tamsulosin HCl; Take 1 capsule (0.4 mg total) by mouth daily after supper.  Dispense: 30 capsule; Refill: 0  Benign paroxysmal positional vertigo due to bilateral vestibular disorder -     Ambulatory referral to Home Health  Reviewed  hospital discharge summary from 11/23/23 along with labs and imaging. Has home health but will extend and needs an aid as well. Symptoms consistent with BPPV now that BP is more stable, will have PT help him with vestibular exercises as well. Nystatin powder and cream sent to pharmacy for yeast dermatitis.   Return in about 4 weeks (around 01/11/2024) for with me or [name].    Margarita Mail, DO

## 2023-12-14 NOTE — Assessment & Plan Note (Signed)
Blood pressure stable today but going low with standing. Agree with discontinuation of Amlodipine and Losartan, continue Metoprolol 12.5 mg daily. Continue to monitor at home and follow up in 1 month.

## 2023-12-15 LAB — BASIC METABOLIC PANEL
BUN/Creatinine Ratio: 12 (calc) (ref 6–22)
BUN: 17 mg/dL (ref 7–25)
CO2: 20 mmol/L (ref 20–32)
Calcium: 9.3 mg/dL (ref 8.6–10.3)
Chloride: 108 mmol/L (ref 98–110)
Creat: 1.38 mg/dL — ABNORMAL HIGH (ref 0.70–1.35)
Glucose, Bld: 93 mg/dL (ref 65–99)
Potassium: 3.8 mmol/L (ref 3.5–5.3)
Sodium: 139 mmol/L (ref 135–146)

## 2023-12-18 NOTE — Progress Notes (Unsigned)
Cardiology Clinic Note   Date: 12/21/2023 ID: Jonathon Snow, DOB 12-16-1961, MRN [id number]  Primary Cardiologist:  Yvonne Kendall, MD  Patient Profile    Jonathon Snow is a 62 y.o. male who presents to the clinic today for hospital follow up.     Past medical history significant for: CAD. LHC 11/05/2023 (STEMI): Severe multivessel CAD.  He is a 80 to 90% distal LCx.  Occlusion of proximal RCA with bridging and left-to-right collaterals that could not be crossed suggesting chronic nature.  Moderate to severe but noncritical proximal/mid LAD and proximal ramus disease.  IVUS guided PCI with DES 3.0 x 30 mm to distal LCx.  Recommend triple therapy with aspirin, Plavix, Eliquis x 1 month then discontinue aspirin and continue Plavix and Eliquis x 12 months.  Consider functional study when patient has recovered from hospitalization to assess hemodynamic significance of LAD and ramus intermedius disease. Chronic diastolic heart failure. Echo 11/06/2023: EF 55 to 60%.  No RWMA.  Mild LVH.  Grade I DD.  Normal RV function.  Mild LVH.  Mildly elevated PA pressure.  No significant valvular abnormalities. Syncope. Hypertension. Hyperlipidemia. Lipid panel 11/05/2023: LDL 119, HDL 59, TG 105, total 199. Chronic venous insufficiency. DVT/PE. S/p IVC filter. Venous ultrasound 02/03/2023: Nonocclusive DVT left popliteal vein.  No DVT right lower extremity. 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 Varicose veins. COPD. SLE. CKD stage IIIb. T10 fracture. S/p ORIF of T10 fracture and posterior lateral arthrodesis T7-T12 November 2024.  In summary, patient was first evaluated by Dr. Mariah Snow on 09/20/2021 for palpitations and shortness of breath at the request of Jonathon Berry, PA-C.  Echo performed prior to visit demonstrated EF 55 to 60%, no RWMA, Grade II DD, normal RV size/function, no significant valvular abnormalities.  Nuclear stress testing was a low risk study with no evidence of  ischemia.  There were coronary artery calcifications in the LAD and LCx.  Patient presented to the ED on 11/05/2023 following a syncopal event at home.  Patient reported being in his usual state of health cooking at home when he suddenly felt lightheaded and fell backwards striking his head.  He believes he passed out for short period of time.  Upon EMS arrival patient was hypotensive and bleeding from his scalp.  EKG showed inferior ST segment elevation prompting activation of STEMI.  Upon arrival to ED bleeding from scalp laceration had stopped.  He denied chest pain but complained of mid back pain and increased shortness of breath from baseline as well as diaphoresis.  He underwent emergent head CT and cervical spine CT prior to being taken to the Cath Lab emergently where he underwent PCI with DES to distal LCx (further details above).  Patient had recurrent lightheadedness with hypotension and bradycardia suspicious for vasovagal episode.  He improved with saline bolus however complained of left lower abdominal pain and was noted to have new bruising along left lower quadrant/left flank.  It was suggested he undergo CT abdomen pelvis which revealed T10 fracture.  Neurosurgery was consulted and felt fracture was highly unstable and could lead to paralysis and recommended urgent surgical stabilization.  He underwent ORIF of T10 fracture and posterior lateral arthrodesis T7-T12.  Patient was discharged on 11/12/2023.     History of Present Illness    AITOR ORSBURN is followed by Dr. Okey Snow for the above outlined history.   Patient's daughter contacted the office on 11/17/2023 with concerns for patient being alone, not eating and feeling weak.  She requested Dr. Okey Snow and nurse check on patient.  It was suggested she contact PCP to involve social services.  Patient presented to the ED on 11/23/2023 with vomiting, back pain, chest tightness, dizziness.  EKG with no acute changes.  Head CT negative for acute  process.  CTA chest negative for PE or pneumonia.  Initial labs: Sodium 132, potassium 4.4, creatinine 1.72, BUN 18, WBC 6.3, hemoglobin 13.7, BNP 23.1.  Troponin negative x 2.  He was admitted for vertigo.  MRI brain was negative.  Meclizine did not improve symptoms.  Vestibular physical therapy ruled out peripheral vertigo.  Orthostatic vitals before discharge were positive.  Amlodipine and losartan were discontinued.  Toprol was continued with holding parameters.  Patient was discharged with midodrine 5 mg 3 times daily to keep blood pressure around 140.  Patient was discharged on 11/25/2023.  Today, patient is here alone. He reports continued dizzy spells with position changes. The spells are somewhat better with midodrine three times a day. Dizzy spells occur with any position change even rolling over in bed and can last for minutes or longer. He is checking BP regularly at home and typically gets readings around 118. He has not caught his BP going any lower. His nausea is somewhat improved and he is starting to eat better and reports he tries his best to stay hydrated but does not always do great with that. He is working with home PT to rehab his back. He reports occasional central chest tightness with heavier exertion that is brief.      ROS: All other systems reviewed and are otherwise negative except as noted in History of Present Illness.  EKGs/Labs Reviewed    EKG Interpretation Date/Time:  Monday December 21 2023 11:27:04 EST Ventricular Rate:  95 PR Interval:  142 QRS Duration:  88 QT Interval:  360 QTC Calculation: 452 R Axis:   51  Text Interpretation: Normal sinus rhythm Normal ECG When compared with ECG of 23-Nov-2023 15:10, No significant change was found Confirmed by Jonathon Snow [phone]) on 12/21/2023 11:32:24 AM   11/24/2023: ALT 11; AST 15 12/14/2023: BUN 17; Creat 1.38; Potassium 3.8; Sodium 139   11/25/2023: Hemoglobin 11.9; WBC 3.4   10/28/2023: TSH 1.06    11/23/2023: B Natriuretic Peptide 23.1       Physical Exam    VS:  BP 118/60 (BP Location: Left Arm, Patient Position: Sitting, Cuff Size: Normal)   Pulse 95   Ht 6\' 2"  (1.88 m)   Wt 227 lb 6 oz (103.1 kg)   SpO2 98%   BMI 29.19 kg/m  , BMI Body mass index is 29.19 kg/m.  Orthostatic VS for the past 24 hrs (Last 3 readings):  BP- Lying Pulse- Lying BP- Sitting Pulse- Sitting BP- Standing at 0 minutes Pulse- Standing at 0 minutes  12/21/23 1129 122/84 91 108/77 102 103/68 105     GEN: Well nourished, well developed, in no acute distress. Neck: No JVD or carotid bruits. Cardiac:  RRR. No murmurs. No rubs or gallops.   Respiratory:  Respirations regular and unlabored. Clear to auscultation without rales, wheezing or rhonchi. GI: Soft, nontender, nondistended. Extremities: Radials/DP/PT 2+ and equal bilaterally. No clubbing or cyanosis. 1+ pitting edema left lower extremity, trace edema right lower extremity. Chronic skin color changes left lower extremity.  Skin: Warm and dry, no rash. Neuro: Strength intact.  Assessment & Plan   CAD S/p PCI with DES to distal LCx.  CTO proximal RCA with bridging and  left to right collaterals, moderate to severe but noncritical proximal/mid LAD and proximal ramus disease.  Patient reports occasional, brief central chest tightness with heavier exertion.  Denies blood in stool or urine. He has been getting nose bleeds. Suggested he use saline nasal spray.  -Continue Plavix, Eliquis, Toprol, rosuvastatin. -Cannot schedule stress testing at this point secondary to recovering from back surgery and continued orthostasis.   Chronic diastolic heart failure Echo November 2024 EF 55-60%, mild LVH, grade I DD, mildly elevated PA pressure. Patient reports L>R lower extremity edema which is normal for him secondary to history of left lower extremity DVT. 1+ pitting edema on the left and trace edema right lower extremity. Otherwise euvolemic and well  compensated on exam. -GDMT limited secondary to orthostatic hypotension. -Continue Toprol.  Orthostatic hypotension/Dizziness BP today 118/60 on intake. Orthostatic vitals showed a 14 mmHg drop from lying to sitting and 5 mmHg drop from sit to stand with complaints of dizziness with changes in position. He could not stand for 3 minutes secondary to dizziness and back pain. Patient reports continued episodes of dizziness with any position changes including rolling over in bed. He feels the midodrine has helped improve symptoms somewhat. Home SBP is running around 118. He cannot determine a pattern to dizzy spells and is uncertain if there is a time of day that is worse than others. He is eating better since nausea has resolved and he tries to stay well hydrated although some days are better than others. He has not noticed an increase in dizzy spells on the days he does not drink as much.  -Increase midodrine to 10 mg day.  -CBC and BMP today.   Hyperlipidemia LDL November 2024 119, not at goal. -Continue rosuvastatin.  Disposition: CBC and BMP today. Increase midodrine to 10 mg tid. Do not take for SBP >140. Return in 1 month or sooner as needed.          Signed, Etta Grandchild. Ahyan Kreeger, DNP, NP-C

## 2023-12-21 ENCOUNTER — Ambulatory Visit: Payer: Medicaid Other | Attending: Student | Admitting: Student

## 2023-12-21 ENCOUNTER — Other Ambulatory Visit: Payer: Self-pay

## 2023-12-21 ENCOUNTER — Encounter: Payer: Self-pay | Admitting: Student

## 2023-12-21 VITALS — BP 118/60 | HR 95 | Ht 74.0 in | Wt 227.4 lb

## 2023-12-21 DIAGNOSIS — R55 Syncope and collapse: Secondary | ICD-10-CM | POA: Diagnosis not present

## 2023-12-21 DIAGNOSIS — I5032 Chronic diastolic (congestive) heart failure: Secondary | ICD-10-CM | POA: Diagnosis not present

## 2023-12-21 DIAGNOSIS — E785 Hyperlipidemia, unspecified: Secondary | ICD-10-CM

## 2023-12-21 DIAGNOSIS — I951 Orthostatic hypotension: Secondary | ICD-10-CM

## 2023-12-21 DIAGNOSIS — R42 Dizziness and giddiness: Secondary | ICD-10-CM | POA: Diagnosis not present

## 2023-12-21 DIAGNOSIS — I25118 Atherosclerotic heart disease of native coronary artery with other forms of angina pectoris: Secondary | ICD-10-CM | POA: Diagnosis not present

## 2023-12-21 DIAGNOSIS — S22078D Other fracture of T9-T10 vertebra, subsequent encounter for fracture with routine healing: Secondary | ICD-10-CM

## 2023-12-21 DIAGNOSIS — Z79899 Other long term (current) drug therapy: Secondary | ICD-10-CM

## 2023-12-21 MED ORDER — MIDODRINE HCL 10 MG PO TABS: 10.0000 mg | ORAL_TABLET | Freq: Three times a day (TID) | ORAL | 3 refills | Status: DC

## 2023-12-21 NOTE — Patient Instructions (Signed)
Medication Instructions:  Your physician recommends the following medication changes.  START TAKING: Midodrine 10 mg three time daily -- DO NOT take if your systolic (blood pressure top number) is greater than 140  *If you need a refill on your cardiac medications before your next appointment, please call your pharmacy*   Lab Work: Your provider would like for you to have following labs drawn today CBC and BMET.   If you have labs (blood work) drawn today and your tests are completely normal, you will receive your results only by: MyChart Message (if you have MyChart) OR A paper copy in the mail If you have any lab test that is abnormal or we need to change your treatment, we will call you to review the results.   Testing/Procedures: None ordered at this time    Follow-Up: At [facility], you and your health needs are our priority.  As part of our continuing mission to provide you with exceptional heart care, we have created designated Provider Care Teams.  These Care Teams include your primary Cardiologist (physician) and Advanced Practice Providers (APPs -  Physician Assistants and Nurse Practitioners) who all work together to provide you with the care you need, when you need it.  We recommend signing up for the patient portal called "MyChart".  Sign up information is provided on this After Visit Summary.  MyChart is used to connect with patients for Virtual Visits (Telemedicine).  Patients are able to view lab/test results, encounter notes, upcoming appointments, etc.  Non-urgent messages can be sent to your provider as well.   To learn more about what you can do with MyChart, go to [email or web address].    Your next appointment:   1 month(s)  Provider:   You may see Yvonne Kendall, MD

## 2023-12-22 ENCOUNTER — Ambulatory Visit
Admission: RE | Admit: 2023-12-22 | Discharge: 2023-12-22 | Disposition: A | Discharge status: Home / Self Care | Payer: Medicaid Other | Source: Ambulatory Visit | Attending: Neurosurgery | Admitting: Neurosurgery

## 2023-12-22 ENCOUNTER — Ambulatory Visit
Admission: RE | Admit: 2023-12-22 | Discharge: 2023-12-22 | Disposition: A | Discharge status: Home / Self Care | Payer: Medicaid Other | Attending: Neurosurgery | Admitting: Neurosurgery

## 2023-12-22 ENCOUNTER — Ambulatory Visit (INDEPENDENT_AMBULATORY_CARE_PROVIDER_SITE_OTHER): Payer: Medicaid Other | Admitting: Neurosurgery

## 2023-12-22 ENCOUNTER — Encounter: Payer: Self-pay | Admitting: Neurosurgery

## 2023-12-22 VITALS — BP 138/84 | Ht 74.0 in | Wt 227.0 lb

## 2023-12-22 DIAGNOSIS — T8189XD Other complications of procedures, not elsewhere classified, subsequent encounter: Secondary | ICD-10-CM

## 2023-12-22 DIAGNOSIS — S22078D Other fracture of T9-T10 vertebra, subsequent encounter for fracture with routine healing: Secondary | ICD-10-CM

## 2023-12-22 DIAGNOSIS — Z981 Arthrodesis status: Secondary | ICD-10-CM | POA: Diagnosis not present

## 2023-12-22 DIAGNOSIS — Z09 Encounter for follow-up examination after completed treatment for conditions other than malignant neoplasm: Secondary | ICD-10-CM

## 2023-12-22 LAB — CBC
Hematocrit: 38.1 % (ref 37.5–51.0)
Hemoglobin: 11.9 g/dL — ABNORMAL LOW (ref 13.0–17.7)
MCH: 28.8 pg (ref 26.6–33.0)
MCHC: 31.2 g/dL — ABNORMAL LOW (ref 31.5–35.7)
MCV: 92 fL (ref 79–97)
Platelets: 198 10*3/uL (ref 150–450)
RBC: 4.13 x10E6/uL — ABNORMAL LOW (ref 4.14–5.80)
RDW: 14.5 % (ref 11.6–15.4)
WBC: 4.3 10*3/uL (ref 3.4–10.8)

## 2023-12-22 LAB — BASIC METABOLIC PANEL
BUN/Creatinine Ratio: 11 (ref 10–24)
BUN: 16 mg/dL (ref 8–27)
CO2: 18 mmol/L — ABNORMAL LOW (ref 20–29)
Calcium: 9.3 mg/dL (ref 8.6–10.2)
Chloride: 108 mmol/L — ABNORMAL HIGH (ref 96–106)
Creatinine, Ser: 1.41 mg/dL — ABNORMAL HIGH (ref 0.76–1.27)
Glucose: 99 mg/dL (ref 70–99)
Potassium: 4.2 mmol/L (ref 3.5–5.2)
Sodium: 140 mmol/L (ref 134–144)
eGFR: 56 mL/min/{1.73_m2} — ABNORMAL LOW (ref >59)

## 2023-12-22 NOTE — Progress Notes (Signed)
   REFERRING PHYSICIAN:  [name], Pa-c [id number] [place],  [name] [address]  DOS: 11/06/23, T7-12 Posterior fusion with Dr. Clois  HISTORY OF PRESENT ILLNESS: Jonathon Snow is s/p above surgery.  He is doing reasonably well.  He has continued back pain.   PHYSICAL EXAMINATION:  General: Patient is ill appearing.    NEUROLOGICAL:   Awake, alert, oriented to person, place, and time.   Moves all extremities well.  Walking with walker.  He continues to have poor wound healing on multiple of his stab incisions from his percutaneous fixation.   ROS (Neurologic):  Negative except as noted above   ASSESSMENT/PLAN:  Jonathon Snow is s/p above surgery.   He continues to have issues with wound healing.  He is at high risk of surgical wound problems.  Due to this nonhealing wound, I will refer him to the wound care clinic.  I defer further management of his wound to the wound care clinic.   Will see him back in approximately 6 weeks.   Reeves Clois MD Department of neurosurgery

## 2023-12-24 ENCOUNTER — Telehealth: Payer: Self-pay | Admitting: Neurosurgery

## 2023-12-24 DIAGNOSIS — M329 Systemic lupus erythematosus, unspecified: Secondary | ICD-10-CM | POA: Diagnosis not present

## 2023-12-24 DIAGNOSIS — Z7901 Long term (current) use of anticoagulants: Secondary | ICD-10-CM | POA: Diagnosis not present

## 2023-12-24 DIAGNOSIS — Z955 Presence of coronary angioplasty implant and graft: Secondary | ICD-10-CM | POA: Diagnosis not present

## 2023-12-24 DIAGNOSIS — I2510 Atherosclerotic heart disease of native coronary artery without angina pectoris: Secondary | ICD-10-CM | POA: Diagnosis not present

## 2023-12-24 DIAGNOSIS — N39498 Other specified urinary incontinence: Secondary | ICD-10-CM | POA: Diagnosis not present

## 2023-12-24 DIAGNOSIS — F32A Depression, unspecified: Secondary | ICD-10-CM | POA: Diagnosis not present

## 2023-12-24 DIAGNOSIS — G8929 Other chronic pain: Secondary | ICD-10-CM | POA: Diagnosis not present

## 2023-12-24 DIAGNOSIS — I890 Lymphedema, not elsewhere classified: Secondary | ICD-10-CM | POA: Diagnosis not present

## 2023-12-24 DIAGNOSIS — N1831 Chronic kidney disease, stage 3a: Secondary | ICD-10-CM | POA: Diagnosis not present

## 2023-12-24 DIAGNOSIS — I951 Orthostatic hypotension: Secondary | ICD-10-CM | POA: Diagnosis not present

## 2023-12-24 DIAGNOSIS — Z86711 Personal history of pulmonary embolism: Secondary | ICD-10-CM | POA: Diagnosis not present

## 2023-12-24 DIAGNOSIS — Z48812 Encounter for surgical aftercare following surgery on the circulatory system: Secondary | ICD-10-CM | POA: Diagnosis not present

## 2023-12-24 DIAGNOSIS — K219 Gastro-esophageal reflux disease without esophagitis: Secondary | ICD-10-CM | POA: Diagnosis not present

## 2023-12-24 DIAGNOSIS — N179 Acute kidney failure, unspecified: Secondary | ICD-10-CM | POA: Diagnosis not present

## 2023-12-24 DIAGNOSIS — J449 Chronic obstructive pulmonary disease, unspecified: Secondary | ICD-10-CM | POA: Diagnosis not present

## 2023-12-24 DIAGNOSIS — N401 Enlarged prostate with lower urinary tract symptoms: Secondary | ICD-10-CM | POA: Diagnosis not present

## 2023-12-24 DIAGNOSIS — I5032 Chronic diastolic (congestive) heart failure: Secondary | ICD-10-CM | POA: Diagnosis not present

## 2023-12-24 DIAGNOSIS — Z86718 Personal history of other venous thrombosis and embolism: Secondary | ICD-10-CM | POA: Diagnosis not present

## 2023-12-24 DIAGNOSIS — F332 Major depressive disorder, recurrent severe without psychotic features: Secondary | ICD-10-CM | POA: Diagnosis not present

## 2023-12-24 DIAGNOSIS — I130 Hypertensive heart and chronic kidney disease with heart failure and stage 1 through stage 4 chronic kidney disease, or unspecified chronic kidney disease: Secondary | ICD-10-CM | POA: Diagnosis not present

## 2023-12-24 DIAGNOSIS — S22079D Unspecified fracture of T9-T10 vertebra, subsequent encounter for fracture with routine healing: Secondary | ICD-10-CM | POA: Diagnosis not present

## 2023-12-24 DIAGNOSIS — F419 Anxiety disorder, unspecified: Secondary | ICD-10-CM | POA: Diagnosis not present

## 2023-12-24 DIAGNOSIS — E785 Hyperlipidemia, unspecified: Secondary | ICD-10-CM | POA: Diagnosis not present

## 2023-12-24 DIAGNOSIS — M810 Age-related osteoporosis without current pathological fracture: Secondary | ICD-10-CM | POA: Diagnosis not present

## 2023-12-24 NOTE — Telephone Encounter (Signed)
 T7-12 fixation on 11/06/23  The wound clinic scheduled him for their next available opening on 01/28/2024. He said that he can not wait that long. Is there anything that Dr.Yarbrough can do to get him seen asap?

## 2023-12-28 DIAGNOSIS — Z955 Presence of coronary angioplasty implant and graft: Secondary | ICD-10-CM | POA: Diagnosis not present

## 2023-12-28 DIAGNOSIS — E785 Hyperlipidemia, unspecified: Secondary | ICD-10-CM | POA: Diagnosis not present

## 2023-12-28 DIAGNOSIS — Z86718 Personal history of other venous thrombosis and embolism: Secondary | ICD-10-CM | POA: Diagnosis not present

## 2023-12-28 DIAGNOSIS — I951 Orthostatic hypotension: Secondary | ICD-10-CM | POA: Diagnosis not present

## 2023-12-28 DIAGNOSIS — N39498 Other specified urinary incontinence: Secondary | ICD-10-CM | POA: Diagnosis not present

## 2023-12-28 DIAGNOSIS — I130 Hypertensive heart and chronic kidney disease with heart failure and stage 1 through stage 4 chronic kidney disease, or unspecified chronic kidney disease: Secondary | ICD-10-CM | POA: Diagnosis not present

## 2023-12-28 DIAGNOSIS — S22079D Unspecified fracture of T9-T10 vertebra, subsequent encounter for fracture with routine healing: Secondary | ICD-10-CM | POA: Diagnosis not present

## 2023-12-28 DIAGNOSIS — Z86711 Personal history of pulmonary embolism: Secondary | ICD-10-CM | POA: Diagnosis not present

## 2023-12-28 DIAGNOSIS — Z7901 Long term (current) use of anticoagulants: Secondary | ICD-10-CM | POA: Diagnosis not present

## 2023-12-28 DIAGNOSIS — F419 Anxiety disorder, unspecified: Secondary | ICD-10-CM | POA: Diagnosis not present

## 2023-12-28 DIAGNOSIS — J449 Chronic obstructive pulmonary disease, unspecified: Secondary | ICD-10-CM | POA: Diagnosis not present

## 2023-12-28 DIAGNOSIS — K219 Gastro-esophageal reflux disease without esophagitis: Secondary | ICD-10-CM | POA: Diagnosis not present

## 2023-12-28 DIAGNOSIS — M329 Systemic lupus erythematosus, unspecified: Secondary | ICD-10-CM | POA: Diagnosis not present

## 2023-12-28 DIAGNOSIS — N401 Enlarged prostate with lower urinary tract symptoms: Secondary | ICD-10-CM | POA: Diagnosis not present

## 2023-12-28 DIAGNOSIS — I890 Lymphedema, not elsewhere classified: Secondary | ICD-10-CM | POA: Diagnosis not present

## 2023-12-28 DIAGNOSIS — F32A Depression, unspecified: Secondary | ICD-10-CM | POA: Diagnosis not present

## 2023-12-28 DIAGNOSIS — G8929 Other chronic pain: Secondary | ICD-10-CM | POA: Diagnosis not present

## 2023-12-28 DIAGNOSIS — M810 Age-related osteoporosis without current pathological fracture: Secondary | ICD-10-CM | POA: Diagnosis not present

## 2023-12-28 DIAGNOSIS — I2510 Atherosclerotic heart disease of native coronary artery without angina pectoris: Secondary | ICD-10-CM | POA: Diagnosis not present

## 2023-12-28 DIAGNOSIS — N1831 Chronic kidney disease, stage 3a: Secondary | ICD-10-CM | POA: Diagnosis not present

## 2023-12-28 DIAGNOSIS — I5032 Chronic diastolic (congestive) heart failure: Secondary | ICD-10-CM | POA: Diagnosis not present

## 2023-12-28 DIAGNOSIS — N179 Acute kidney failure, unspecified: Secondary | ICD-10-CM | POA: Diagnosis not present

## 2023-12-28 DIAGNOSIS — Z48812 Encounter for surgical aftercare following surgery on the circulatory system: Secondary | ICD-10-CM | POA: Diagnosis not present

## 2023-12-28 NOTE — Telephone Encounter (Signed)
 Patient is calling back . If you can not get him into a wound clinic sooner do you think he should be on antibiotics? He says that the wounds are still bleeding.

## 2023-12-29 NOTE — Telephone Encounter (Signed)
  Media Information   Document Information  Please review message from [facility].

## 2023-12-29 NOTE — Telephone Encounter (Signed)
 Patient is aware that Dr.Yarbrough did speak to the wound clinic about trying to get the patient in sooner. Patient is asking if you want [facility] to take care of the wound in the mean time?

## 2023-12-30 NOTE — Telephone Encounter (Signed)
 [facility] called that they worked the patient in for tomorrow. Trey Paula with [facility] is aware.

## 2023-12-31 ENCOUNTER — Encounter: Payer: Medicaid Other | Attending: Physician Assistant | Admitting: Physician Assistant

## 2023-12-31 DIAGNOSIS — I10 Essential (primary) hypertension: Secondary | ICD-10-CM | POA: Diagnosis not present

## 2023-12-31 DIAGNOSIS — J4489 Other specified chronic obstructive pulmonary disease: Secondary | ICD-10-CM | POA: Diagnosis not present

## 2023-12-31 DIAGNOSIS — I5042 Chronic combined systolic (congestive) and diastolic (congestive) heart failure: Secondary | ICD-10-CM | POA: Diagnosis not present

## 2023-12-31 DIAGNOSIS — I130 Hypertensive heart and chronic kidney disease with heart failure and stage 1 through stage 4 chronic kidney disease, or unspecified chronic kidney disease: Secondary | ICD-10-CM | POA: Diagnosis not present

## 2023-12-31 DIAGNOSIS — Z86718 Personal history of other venous thrombosis and embolism: Secondary | ICD-10-CM | POA: Insufficient documentation

## 2023-12-31 DIAGNOSIS — L98428 Non-pressure chronic ulcer of back with other specified severity: Secondary | ICD-10-CM | POA: Insufficient documentation

## 2023-12-31 DIAGNOSIS — N183 Chronic kidney disease, stage 3 unspecified: Secondary | ICD-10-CM | POA: Diagnosis not present

## 2023-12-31 DIAGNOSIS — W19XXXS Unspecified fall, sequela: Secondary | ICD-10-CM | POA: Insufficient documentation

## 2023-12-31 DIAGNOSIS — I890 Lymphedema, not elsewhere classified: Secondary | ICD-10-CM | POA: Diagnosis not present

## 2023-12-31 DIAGNOSIS — S22078S Other fracture of T9-T10 vertebra, sequela: Secondary | ICD-10-CM | POA: Insufficient documentation

## 2023-12-31 DIAGNOSIS — M328 Other forms of systemic lupus erythematosus: Secondary | ICD-10-CM | POA: Diagnosis not present

## 2023-12-31 DIAGNOSIS — I2510 Atherosclerotic heart disease of native coronary artery without angina pectoris: Secondary | ICD-10-CM | POA: Diagnosis not present

## 2024-01-01 DIAGNOSIS — I2510 Atherosclerotic heart disease of native coronary artery without angina pectoris: Secondary | ICD-10-CM | POA: Diagnosis not present

## 2024-01-01 DIAGNOSIS — Z7901 Long term (current) use of anticoagulants: Secondary | ICD-10-CM | POA: Diagnosis not present

## 2024-01-01 DIAGNOSIS — G8929 Other chronic pain: Secondary | ICD-10-CM | POA: Diagnosis not present

## 2024-01-01 DIAGNOSIS — Z86711 Personal history of pulmonary embolism: Secondary | ICD-10-CM | POA: Diagnosis not present

## 2024-01-01 DIAGNOSIS — F419 Anxiety disorder, unspecified: Secondary | ICD-10-CM | POA: Diagnosis not present

## 2024-01-01 DIAGNOSIS — F32A Depression, unspecified: Secondary | ICD-10-CM | POA: Diagnosis not present

## 2024-01-01 DIAGNOSIS — Z86718 Personal history of other venous thrombosis and embolism: Secondary | ICD-10-CM | POA: Diagnosis not present

## 2024-01-01 DIAGNOSIS — N39498 Other specified urinary incontinence: Secondary | ICD-10-CM | POA: Diagnosis not present

## 2024-01-01 DIAGNOSIS — M329 Systemic lupus erythematosus, unspecified: Secondary | ICD-10-CM | POA: Diagnosis not present

## 2024-01-01 DIAGNOSIS — Z48812 Encounter for surgical aftercare following surgery on the circulatory system: Secondary | ICD-10-CM | POA: Diagnosis not present

## 2024-01-01 DIAGNOSIS — I130 Hypertensive heart and chronic kidney disease with heart failure and stage 1 through stage 4 chronic kidney disease, or unspecified chronic kidney disease: Secondary | ICD-10-CM | POA: Diagnosis not present

## 2024-01-01 DIAGNOSIS — I5032 Chronic diastolic (congestive) heart failure: Secondary | ICD-10-CM | POA: Diagnosis not present

## 2024-01-01 DIAGNOSIS — K219 Gastro-esophageal reflux disease without esophagitis: Secondary | ICD-10-CM | POA: Diagnosis not present

## 2024-01-01 DIAGNOSIS — N401 Enlarged prostate with lower urinary tract symptoms: Secondary | ICD-10-CM | POA: Diagnosis not present

## 2024-01-01 DIAGNOSIS — I890 Lymphedema, not elsewhere classified: Secondary | ICD-10-CM | POA: Diagnosis not present

## 2024-01-01 DIAGNOSIS — E785 Hyperlipidemia, unspecified: Secondary | ICD-10-CM | POA: Diagnosis not present

## 2024-01-01 DIAGNOSIS — S22079D Unspecified fracture of T9-T10 vertebra, subsequent encounter for fracture with routine healing: Secondary | ICD-10-CM | POA: Diagnosis not present

## 2024-01-01 DIAGNOSIS — I951 Orthostatic hypotension: Secondary | ICD-10-CM | POA: Diagnosis not present

## 2024-01-01 DIAGNOSIS — N179 Acute kidney failure, unspecified: Secondary | ICD-10-CM | POA: Diagnosis not present

## 2024-01-01 DIAGNOSIS — Z955 Presence of coronary angioplasty implant and graft: Secondary | ICD-10-CM | POA: Diagnosis not present

## 2024-01-01 DIAGNOSIS — J449 Chronic obstructive pulmonary disease, unspecified: Secondary | ICD-10-CM | POA: Diagnosis not present

## 2024-01-01 DIAGNOSIS — M810 Age-related osteoporosis without current pathological fracture: Secondary | ICD-10-CM | POA: Diagnosis not present

## 2024-01-01 DIAGNOSIS — N1831 Chronic kidney disease, stage 3a: Secondary | ICD-10-CM | POA: Diagnosis not present

## 2024-01-01 NOTE — Progress Notes (Signed)
 Hedtke, Umberto G ([id number]) [phone] [id number] Page 1 of 5 Visit Report for 12/31/2023 Abuse Risk Screen Details Patient Name: Date of Service: Jonathon Snow, Jonathon Snow 12/31/2023 9:30 A M Medical Record Number: [id number] Patient Account Number: [id number] Date of Birth/Sex: Treating RN: February 02, 1961 (63 y.o. [name]) [name] Primary Care [name]: [name] Other Clinician: Referring Samanvi Cuccia: Treating Ardis Fullwood/Extender: [name] [name] Weeks in Treatment: 0 Abuse Risk Screen Items Answer ABUSE RISK SCREEN: Has anyone close to you tried to hurt or harm you recentlyo No Do you feel uncomfortable with anyone in your familyo No Has anyone forced you do things that you didnt want to doo No Electronic Signature(s) Signed: 01/01/2024 8:27:37 AM By: [name] RN Entered By: [name] on 12/31/2023 06:41:18 -------------------------------------------------------------------------------- Activities of Daily Living Details Patient Name: Date of Service: Jonathon Snow, Jonathon Snow 12/31/2023 9:30 A M Medical Record Number: [id number] Patient Account Number: [id number] Date of Birth/Sex: Treating RN: [birth year] (63 y.o. [name] [name] Primary Care [name]: [name] Other Clinician: Referring [name]: Treating Johnluke Haugen/Extender: [name] [name] Weeks in Treatment: 0 Activities of Daily Living Items Answer Activities of Daily Living (Please select one for each item) Drive Automobile Completely Able T Medications ake Completely Able Use T elephone Completely Able Care for Appearance Completely Able Use T oilet Completely Able Bath / Shower Completely Able Dress Self Completely Able Feed Self Completely Able Walk Completely Able Get In / Out Bed Completely Able Housework Completely KAIZER, DISSINGER ([id number]) [id number] Page 2 of 5 Prepare Meals Completely Able Handle Money Completely Able Shop  for Self Completely Able Electronic Signature(s) Signed: 01/01/2024 8:27:37 AM By: [name] RN Entered By: [name] on 12/31/2023 06:41:47 -------------------------------------------------------------------------------- Education Screening Details Patient Name: Date of Service: Jonathon Snow 12/31/2023 9:30 A M Medical Record Number: [id number] Patient Account Number: [id number] Date of Birth/Sex: Treating RN: 24-Nov-1962 (63 y.o. [name] [name] Primary Care Candy Ziegler: [name] Other Clinician: Referring Shruthi Northrup: Treating [name]/Extender: [name] [name] Devra in Treatment: 0 Primary Learner Assessed: Patient Learning Preferences/Education Level/Primary Language Learning Preference: Explanation Highest Education Level: College or Above Preferred Language: English Cognitive Barrier Language Barrier: No Translator Needed: No Memory Deficit: No Emotional Barrier: No Cultural/Religious Beliefs Affecting Medical Care: No Physical Barrier Impaired Vision: No Impaired Hearing: No Decreased Hand dexterity: No Knowledge/Comprehension Knowledge Level: Medium Comprehension Level: High Ability to understand written instructions: High Ability to understand verbal instructions: High Motivation Anxiety Level: Anxious Cooperation: Cooperative Education Importance: Acknowledges Need Interest in Health Problems: Asks Questions Perception: Coherent Willingness to Engage in Self-Management High Activities: Readiness to Engage in Self-Management High Activities: Electronic Signature(s) Signed: 01/01/2024 8:27:37 AM By: [name] RN Entered By: [name] on 12/31/2023 06:42:20 Jonathon Snow ([id number]) [id number] Page 3 of 5 -------------------------------------------------------------------------------- Fall Risk Assessment Details Patient Name: Date of Service: Jonathon Snow, Jonathon Snow 12/31/2023 9:30 A M Medical Record  Number: [id number] Patient Account Number: [id number] Date of Birth/Sex: Treating RN: [birth year] (63 y.o. [name]) [name] Primary Care Kealey Kemmer: [name] Other Clinician: Referring Ravina Milner: Treating [name]/Extender: [name] [name] Weeks in Treatment: 0 Fall Risk Assessment Items Have you had 2 or more falls in the last 12 monthso 0 Yes Have you had any fall that resulted in injury in the last 12 monthso 0 Yes FALLS RISK SCREEN History of falling - immediate or within 3 months 25 Yes Secondary diagnosis (Do you have 2 or more medical diagnoseso) 15 Yes Ambulatory aid None/bed rest/wheelchair/nurse  0 Yes Crutches/cane/walker 0 No Furniture 0 No Intravenous therapy Access/Saline/Heparin  Lock 0 No Gait/Transferring Normal/ bed rest/ wheelchair 0 Yes Weak (short steps with or without shuffle, stooped but able to lift head while walking, may seek 0 No support from furniture) Impaired (short steps with shuffle, may have difficulty arising from chair, head down, impaired 0 No balance) Mental Status Oriented to own ability 0 Yes Electronic Signature(s) Signed: 01/01/2024 8:27:37 AM By: [name] RN Entered By: [name] on 12/31/2023 06:43:11 -------------------------------------------------------------------------------- Foot Assessment Details Patient Name: Date of Service: Jonathon Snow 12/31/2023 9:30 A M Medical Record Number: [id number] Patient Account Number: [id number] Date of Birth/Sex: Treating RN: 05-08-1962 (63 y.o. [name] [name] Primary Care Kijana Estock: [name] Other Clinician: Referring Jefte Carithers: Treating Loan Oguin/Extender: [name] [name] Weeks in Treatment: 0 Foot Assessment Items Site Locations Jonathon Snow, Jonathon Snow ([id number]) [phone] [id number] Page 4 of 5 + = Sensation present, - = Sensation absent, C = Callus, U = Ulcer R = Redness, W = Warmth, M = Maceration, PU = Pre-ulcerative lesion F =  Fissure, S = Swelling, D = Dryness Assessment Right: Left: Other Deformity: No No Prior Foot Ulcer: No No Prior Amputation: No No Charcot Joint: No No Ambulatory Status: Ambulatory With Help Assistance Device: Walker GaitFutures Trader) Signed: 01/01/2024 8:27:37 AM By: [name] RN Entered By: [name] on 12/31/2023 06:43:43 -------------------------------------------------------------------------------- Nutrition Risk Screening Details Patient Name: Date of Service: Jonathon Snow, Jonathon Snow 12/31/2023 9:30 A M Medical Record Number: [id number] Patient Account Number: [id number] Date of Birth/Sex: Treating RN: 24-May-1961 (62 y.o. [name] [name] Primary Care Saida Lonon: [name] Other Clinician: Referring Graciano Batson: Treating Ayaan Shutes/Extender: [name] [name] Weeks in Treatment: 0 Height (in): 74 Weight (lbs): 230 Body Mass Index (BMI): 29.5 Nutrition Risk Screening Items Score Screening NUTRITION RISK SCREEN: I have an illness or condition that made me change the kind and/or amount of food I eat 0 No I eat fewer than two meals per day 0 No I eat few fruits and vegetables, or milk products 0 No I have three or more drinks of beer, liquor or wine almost every day 0 No I have tooth or mouth problems that make it hard for me to eat 0 No Jonathon Snow, Jonathon Snow ([id number]) [id number] Page 5 of 5 I don't always have enough money to buy the food I need 0 No I eat alone most of the time 0 No I take three or more different prescribed or over-the-counter drugs a day 1 Yes Without wanting to, I have lost or gained 10 pounds in the last six months 0 No I am not always physically able to shop, cook and/or feed myself 2 Yes Nutrition Protocols Good Risk Protocol Moderate Risk Protocol 0 Provide education on nutrition High Risk Proctocol Risk Level: Moderate Risk Score: 3 Electronic Signature(s) Signed: 01/01/2024 8:27:37 AM  By: [name] RN Entered By: [name] on 12/31/2023 06:43:29

## 2024-01-01 NOTE — Progress Notes (Signed)
 Jonathon Snow, Jonathon Snow ([id number]) [id number] Page 1 of 8 Visit Report for 12/31/2023 Chief Complaint Document Details Patient Name: Date of Service: Jonathon Snow, Jonathon RRY Snow. 12/31/2023 9:30 A M Medical Record Number: [id number] Patient Account Number: [id number] Date of Birth/Sex: Treating RN: 04-Nov-1961 (63 y.o. [name]) Alverta Snow Primary Care Provider: [name] Other Clinician: Referring Provider: Treating Provider/Extender: [name] [name] Weeks in Treatment: 0 Information Obtained from: Patient Chief Complaint Slow healing wound of the back following surgery Electronic Signature(s) Signed: 12/31/2023 10:13:46 AM By: [name] PA-C Entered By: [name] on 12/31/2023 07:13:46 -------------------------------------------------------------------------------- HPI Details Patient Name: Date of Service: Jonathon Snow Snow 12/31/2023 9:30 A M Medical Record Number: [id number] Patient Account Number: [id number] Date of Birth/Sex: Treating RN: 1961/11/09 (63 y.o. Jonathon Snow Primary Care Provider: [name] Other Clinician: Referring Provider: Treating Provider/Extender: [name] [name] Weeks in Treatment: 0 History of Present Illness HPI Description: 12-31-23 upon evaluation today patient appears to be doing somewhat poorly overall in regard to his pain that he tells me that he is having some significant back pain still he unfortunately had a T12 spinal fracture secondary to a fall and has been managed by Dr. Reeves Nine who is a neurosurgeon locally. Dr. Nine did perform surgery and actually asked us  to evaluate his wounds as he was having difficulty getting this to heal. With that being said upon arrival today the patient tells me that the wounds a week ago were pretty bad he has pictures of those with that being said the great news is once we remove the eschar today all the wounds actually appear to be healed which is awesome  news. A few these areas have very fragile skin noted but otherwise this looks to be doing quite well. Patient has a history of a T12 vertebral spine fracture, lymphedema, hypertension, lupus, a history of DVT coronary artery disease, congestive heart failure, , chronic kidney disease stage III, and COPD. Electronic Signature(s) Signed: 12/31/2023 11:15:53 AM By: [name] PA-C Entered By: [name] on 12/31/2023 08:15:53 Jonathon WADIE MATSU ([id number]) [id number] Page 2 of 8 -------------------------------------------------------------------------------- Physical Exam Details Patient Name: Date of Service: Snow, Jonathon 12/31/2023 9:30 A M Medical Record Number: [id number] Patient Account Number: [id number] Date of Birth/Sex: Treating RN: 1961/11/08 (63 y.o. Jonathon Snow Primary Care Provider: [name] Other Clinician: Referring Provider: Treating Provider/Extender: [name] [name] Weeks in Treatment: 0 Constitutional patient is hypertensive.. pulse regular and within target range for patient.SABRA respirations regular, non-labored and within target range for patient.SABRA temperature within target range for patient.. Well-nourished and well-hydrated in no acute distress. Eyes conjunctiva clear no eyelid edema noted. pupils equal round and reactive to light and accommodation. Ears, Nose, Mouth, and Throat no gross abnormality of ear auricles or external auditory canals. normal hearing noted during conversation. mucus membranes moist. Respiratory normal breathing without difficulty. Cardiovascular 1+ pitting edema of the bilateral lower extremities. Musculoskeletal normal gait and posture. no significant deformity or arthritic changes, no loss or range of motion, no clubbing. Psychiatric this patient is able to make decisions and demonstrates good insight into disease process. Alert and Oriented x 3. pleasant and cooperative. Notes Upon  inspection patient's wounds on his back actually appeared to have initially some scabbing areas but upon further evaluation the scabs actually flipped off very easily and underneath there was new skin there were only 3 areas where the skin appears to be much more fragile and at these areas were identified  and in the end Band-Aids were placed over this region for home health to be able to identify as well. I think that that is probably can to be the best thing to do for now as far as over the next week protecting the area from any breakdown or issue. Honestly I think if we can protect these areas with the Band-Aids I think that he will actually have a good chance at toughen in the month but not allowing this to get any worse. Electronic Signature(s) Signed: 12/31/2023 11:16:46 AM By: Bethena Ferraris PA-C Entered By: Bethena Ferraris on 12/31/2023 08:16:46 -------------------------------------------------------------------------------- Physician Orders Details Patient Name: Date of Service: Jonathon Snow Snow 12/31/2023 9:30 A M Medical Record Number: [id number] Patient Account Number: [id number] Date of Birth/Sex: Treating RN: 09-11-1961 (63 y.o. 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 I89.0 Lymphedema, not elsewhere classified I10 Essential (primary) hypertension M32.8 Other forms of systemic lupus erythematosus Z86.718 Personal history of other venous thrombosis and embolism I25.10 Atherosclerotic  heart disease of native coronary artery without angina pectoris I50.42 Chronic combined systolic (congestive) and diastolic (congestive) heart failure N18.30 Chronic kidney disease, stage 3 unspecified J44.89 Other specified chronic obstructive pulmonary disease [facility]charge [phone] to change band aids on back times 3 , 2 times next week then D/C Electronic Signature(s) Signed: 12/31/2023 3:53:53 PM By: Bethena Ferraris PA-C Signed: 01/01/2024 8:27:37 AM By: [name] RN Entered By: Alverta Snow on 12/31/2023 07:22:29 -------------------------------------------------------------------------------- Problem List Details Patient Name: Date of Service: Jonathon Snow Snow 12/31/2023 9:30 A M Medical Record Number: [id number] Patient Account Number: [id number] Date of Birth/Sex: Treating RN: December 28, 1960 (62 y.o. 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 12/31/2023 No Yes I89.0 Lymphedema, not elsewhere classified 12/31/2023 No Yes I10 Essential (primary) hypertension 12/31/2023 No Yes ENIO, HORNBACK ([id number]) [id number] Page 4 of 8 M32.8 Other forms of systemic lupus erythematosus 12/31/2023 No Yes Z86.718 Personal history of other venous thrombosis and embolism 12/31/2023 No Yes I25.10 Atherosclerotic heart disease of native coronary artery without angina pectoris 12/31/2023 No Yes I50.42 Chronic combined systolic (congestive) and diastolic (congestive) heart failure 12/31/2023 No Yes N18.30 Chronic kidney disease, stage 3 unspecified 12/31/2023 No Yes J44.89 Other specified chronic  obstructive pulmonary disease 12/31/2023 No Yes Inactive Problems Resolved Problems Electronic Signature(s) Signed: 12/31/2023 10:13:24 AM By: Bethena Ferraris PA-C Entered By: Bethena Ferraris on 12/31/2023 07:13:23 -------------------------------------------------------------------------------- Progress Note Details Patient Name: Date of Service: Jonathon Snow Snow 12/31/2023 9:30 A M Medical Record Number: [id number] Patient Account Number: [id number] Date of Birth/Sex: Treating RN: 1961/11/09 (63 y.o. Jonathon Snow Primary Care Provider: [name] Other Clinician: Referring Provider: Treating Provider/Extender: Bethena Ferraris [name] Weeks in Treatment: 0 Subjective Chief Complaint Information obtained from Patient Slow healing wound of the back following surgery History of Present Illness (HPI)  12-31-23 upon evaluation today patient appears to be doing somewhat poorly overall in regard to his pain that he tells me that he is having some significant back pain still he unfortunately had a T12 spinal fracture secondary to a fall and has been managed by Dr. Reeves Nine who is a neurosurgeon locally. Dr. Nine did perform surgery and actually asked us  to evaluate his wounds as he was having difficulty getting this to heal. With that being said upon arrival today the patient tells me that the wounds a week ago were pretty bad he has pictures of those with that being said the great news is once we remove the eschar today all the wounds actually appear to be healed which is awesome news. A few these areas have very fragile skin noted but otherwise this looks to be doing quite well. Patient has a history of a T12 vertebral spine fracture, lymphedema, hypertension, lupus, a history of DVT coronary artery disease, congestive heart failure, , chronic kidney disease stage III, and COPD. Patient History Allergies Sellin, Basheer Snow ([id number]) [id number] Page 5  of 8 Vicodin, enalapril Social History Never smoker, Marital Status - Divorced, Alcohol Use - Never, Drug Use - No History, Caffeine Use - Daily. Medical History Hematologic/Lymphatic Patient has history of Lymphedema Respiratory Patient has history of Asthma, Chronic Obstructive Pulmonary Disease (COPD) Cardiovascular Patient has history of Congestive Heart Failure Review of Systems (ROS) Genitourinary CKD 3 Integumentary (Skin) Complains or has symptoms of Wounds. Objective Constitutional patient is hypertensive.. pulse regular and within target range for patient.SABRA respirations regular, non-labored and within target range for patient.SABRA temperature within target range for patient.. Well-nourished and well-hydrated in no acute distress. Vitals Time Taken: 9:36 AM, Height: 74 in, Source: Stated, Weight: 230 lbs, Source: Stated, BMI: 29.5, Temperature: 97.4 F, Pulse: 83 bpm, Respiratory Rate: 18 breaths/min, Blood Pressure: 155/106 mmHg. Eyes conjunctiva clear no eyelid edema noted. pupils equal round and reactive to light and accommodation. Ears, Nose, Mouth, and Throat no gross abnormality of ear auricles or external auditory canals. normal hearing noted during conversation. mucus membranes moist. Respiratory normal breathing without difficulty. Cardiovascular 1+ pitting edema of the bilateral lower extremities. Musculoskeletal normal gait and posture. no significant deformity or arthritic changes, no loss or range of motion, no clubbing. Psychiatric this patient is able to make decisions and demonstrates good insight into disease process. Alert and Oriented x 3. pleasant and cooperative. General Notes: Upon inspection patient's wounds on his back actually appeared to have initially some scabbing areas but upon further evaluation the scabs actually flipped off very easily and underneath there was new skin there were only 3 areas where the skin appears to be much more fragile and at  these areas were identified and in the end Band-Aids were placed over this region for home health to be able to identify as well. I think that that is probably can to be the best thing to do for now as far as over the next week protecting the area from any breakdown or issue. Honestly I think if we can protect these areas with the Band-Aids I think that he will actually have a good chance at toughen in the month but not allowing this to get any worse. 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 Lymphedema, not elsewhere classified Essential (primary) hypertension Other forms of systemic lupus erythematosus Personal history of other venous thrombosis and embolism Atherosclerotic heart disease of  native coronary artery without angina pectoris Chronic combined systolic (congestive) and diastolic (congestive) heart failure Chronic kidney disease, stage 3 unspecified Other specified chronic obstructive pulmonary disease Plan ABRAHAN, FULMORE ([id number]) [phone].pdf Page 6 of 8 [facility] [phone] to change band aids on back times 3 , 2 times next week then D/C 1. I am going to recommend at this point that we see the patient just for consult only today that does not appear to be any open wounds which is excellent news. I did relay this to Dr. Reeves Nine as well I gave him a call and we discussed the case and Dr. Nine was very pleased to hear about this. He will be seeing the patient back for his standard 6-week follow-up which I think is about 5 weeks away now in his office as far as the surgical evaluation following is concerned. Will see the patient back just in the wound care center for follow-up visit as needed. Electronic Signature(s) Signed: 12/31/2023 11:17:23 AM By: Bethena Ferraris PA-C Entered  By: Bethena Ferraris on 12/31/2023 08:17:23 -------------------------------------------------------------------------------- ROS/PFSH Details Patient Name: Date of Service: Jonathon Snow Snow 12/31/2023 9:30 A M Medical Record Number: [id number] Patient Account Number: [id number] Date of Birth/Sex: Treating RN: November 25, 1961 (62 y.o. Jonathon Snow Primary Care Provider: [name] Other Clinician: Referring Provider: Treating Provider/Extender: Bethena Ferraris [name] Weeks in Treatment: 0 Integumentary (Skin) Complaints and Symptoms: Positive for: Wounds Hematologic/Lymphatic Medical History: Positive for: Lymphedema Respiratory Medical History: Positive for: Asthma; Chronic Obstructive Pulmonary Disease (COPD) Cardiovascular Medical History: Positive for: Congestive Heart Failure Genitourinary Complaints and Symptoms: Review of System Notes: CKD 3 Immunizations Pneumococcal Vaccine: Received Pneumococcal Vaccination: No Implantable Devices None Family and Social History Never smoker; Marital Status - Divorced; Alcohol Use: Never; Drug Use: No History; Caffeine Use: Daily Garlock, Jonathen Snow ([id number]) [phone].pdf Page 7 of 8 Social Determinants of Health (SDOH) 1. In the past 2 months, did you or others you live with eat smaller meals or skip meals because you didn't have money for foodo : No 2. Are you homeless or worried that you might be in the futureo : No 3. Do you have trouble paying for your utilities (gas, electricity, phone)o : No 4. Do you have trouble finding or paying for a rideo : No 5. Do you need daycare, or better daycare, for your kidso : No 6. Are you unemployed or without regular incomeo : No 7. Do you need help finding a better jobo : No 8. Do you need help getting more educationo : No 9. Are you concerned about someone in your home using drugs or alcoholo : No 10. Do you feel unsafe in your daily lifeo : No 11. Is anyone  in your home threatening or abusing youo : No 12. Do you lack quality relationships that make you feel valued and supportedo : No 13. Do you need help getting cultural information in a language you understando : No 14. Do you need help getting internet accesso : No Advanced Directives and Instructions Spiritual or Cultural beliefs preclude asking about Advance Care Planning: No Advanced Directives: No Patient wants information on Advanced Directives: No Do not resuscitate: No Living Will: No Medical Power of Attorney: Yes; daughter Copy Provided: No Surrogate Decision Maker: No Electronic Signature(s) Signed: 12/31/2023 3:53:53 PM By: Bethena Ferraris PA-C Signed: 01/01/2024 8:27:37 AM By: [name] RN Entered By: Alverta Snow on 12/31/2023 06:41:12 -------------------------------------------------------------------------------- SuperBill Details Patient  Name: Date of Service: Odekirk, Jonathon RRY Snow. 12/31/2023 Medical Record Number: [id number] Patient Account Number: [id number] Date of Birth/Sex: Treating RN: 28-Jun-1961 (62 y.o. 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 I89.0 Lymphedema, not elsewhere classified I10 Essential (primary) hypertension M32.8 Other forms of systemic lupus erythematosus Z86.718 Personal history of other venous thrombosis and embolism I25.10 Atherosclerotic heart disease of native coronary artery without angina pectoris I50.42 Chronic combined systolic (congestive) and diastolic (congestive) heart failure N18.30 Chronic kidney disease, stage 3 unspecified J44.89 Other specified chronic obstructive pulmonary disease Facility Procedures : LOVETT, COFFIN Code:  [id number] MYLO MATSU ([id number]) Description: [phone] - WOUND CARE VISIT-LEV 2 EST PT [phone] Modifier: [id number] Quantity: 1 [id number] Page 8 of 8 Physician Procedures : CPT4 Code Description Modifier [id number] WC PHYS LEVEL 3 NEW PT ICD-10 Diagnosis Description S22.078S Other fracture of T9-T10 vertebra, sequela 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

## 2024-01-01 NOTE — Progress Notes (Signed)
 Jonathon Snow ([id number]) [id number] Page 1 of 6 Visit Report for 12/31/2023 Allergy List Details Patient Name: Date of Service: Jonathon Snow, Jonathon Snow 12/31/2023 9:30 A M Medical Record Number: [id number] Patient Account Number: [id number] Date of Birth/Sex: Treating RN: September 16, 1961 (62 y.o. Jonathon Snow) Jonathon Snow Primary Care Jonathon Snow: Jonathon Snow Other Clinician: Referring Jonathon Snow: Treating Jonathon Snow/Extender: Jonathon Snow Jonathon Snow Weeks in Snow: 0 Allergies Active Allergies Vicodin enalapril Allergy Notes Electronic Signature(s) Signed: 01/01/2024 8:27:37 AM By: Jonathon Sailors RN Entered By: Jonathon Snow on 12/31/2023 06:38:08 -------------------------------------------------------------------------------- Arrival Information Details Patient Name: Date of Service: Jonathon Snow 12/31/2023 9:30 A M Medical Record Number: [id number] Patient Account Number: [id number] Date of Birth/Sex: Treating RN: Sep 16, 1961 (62 y.o. Jonathon Snow Jonathon Snow Primary Care Jonathon Snow: Jonathon Snow Other Clinician: Referring Jonathon Snow: Treating Jonathon Snow/Extender: Jonathon Snow Jonathon Snow Jonathon Snow in Snow: 0 Visit Information Patient Arrived: Jonathon Snow Client Time: 09:34 Accompanied By: self Transfer Assistance: None Patient Identification Verified: Yes Secondary Verification Process Completed: Yes Patient Requires Transmission-Based Precautions: No Patient Has Alerts: Yes Patient Alerts: eliquis  Electronic Signature(s) Jonathon Snow ([id number]) [phone] Page 2 of 6 Signed: 01/01/2024 8:27:37 AM By: Jonathon Sailors RN Entered By: Jonathon Snow on 12/31/2023 06:35:16 -------------------------------------------------------------------------------- Clinic Level of Care Assessment Details Patient Name: Date of Service: Jonathon Snow, Jonathon Snow 12/31/2023 9:30 A M Medical Record Number: [id number] Patient Account Number: [id number] Date of Birth/Sex:  Treating RN: 1961-02-27 (62 y.o. Jonathon Snow Jonathon Snow Primary Care Jonathon Snow: Jonathon Snow Other Clinician: Referring Jonathon Snow: Treating Jonathon Snow/Extender: Jonathon Snow Jonathon Snow Weeks in Snow: 0 Clinic Level of Care Assessment Items TOOL 2 Quantity Score X- 1 0 Use when only an EandM is performed on the INITIAL visit ASSESSMENTS - Nursing Assessment / Reassessment X- 1 20 General Physical Exam (combine w/ comprehensive assessment (listed just below) when performed on new pt. evals) X- 1 25 Comprehensive Assessment (HX, ROS, Risk Assessments, Wounds Hx, etc.) ASSESSMENTS - Wound and Skin A ssessment / Reassessment []  - 0 Simple Wound Assessment / Reassessment - one wound []  - 0 Complex Wound Assessment / Reassessment - multiple wounds []  - 0 Dermatologic / Skin Assessment (not related to wound area) ASSESSMENTS - Ostomy and/or Continence Assessment and Care []  - 0 Incontinence Assessment and Management []  - 0 Ostomy Care Assessment and Management (repouching, etc.) PROCESS - Coordination of Care X - Simple Patient / Family Education for ongoing care 1 15 []  - 0 Complex (extensive) Patient / Family Education for ongoing care []  - 0 Staff obtains Chiropractor, Records, T Results / Process Orders est []  - 0 Staff telephones HHA, Nursing Homes / Clarify orders / etc []  - 0 Routine Transfer to another Facility (non-emergent condition) []  - 0 Routine Hospital Admission (non-emergent condition) []  - 0 New Admissions / Manufacturing Engineer / Ordering NPWT Apligraf, etc. , []  - 0 Emergency Hospital Admission (emergent condition) X- 1 10 Simple Discharge Coordination []  - 0 Complex (extensive) Discharge Coordination PROCESS - Special Needs []  - 0 Pediatric / Minor Patient Management []  - 0 Isolation Patient Management []  - 0 Hearing / Language / Visual special needs []  - 0 Assessment of Community assistance (transportation, D/C planning, etc.) []  -  0 Additional assistance / Altered mentation Jonathon Snow, Jonathon Snow ([id number]) [id number] Page 3 of 6 []  - 0 Support Surface(s) Assessment (bed, cushion, seat, etc.) INTERVENTIONS - Wound Cleansing / Measurement []  - 0 Wound Imaging (photographs - any number of wounds) []  - 0 Wound Tracing (instead of photographs) []  - 0 Simple Wound  Measurement - one wound []  - 0 Complex Wound Measurement - multiple wounds []  - 0 Simple Wound Cleansing - one wound []  - 0 Complex Wound Cleansing - multiple wounds INTERVENTIONS - Wound Dressings []  - 0 Small Wound Dressing one or multiple wounds []  - 0 Medium Wound Dressing one or multiple wounds []  - 0 Large Wound Dressing one or multiple wounds []  - 0 Application of Medications - injection INTERVENTIONS - Miscellaneous []  - 0 External ear exam []  - 0 Specimen Collection (cultures, biopsies, blood, body fluids, etc.) []  - 0 Specimen(s) / Culture(s) sent or taken to Lab for analysis []  - 0 Patient Transfer (multiple staff / Deitra Lift / Similar devices) []  - 0 Simple Staple / Suture removal (25 or less) []  - 0 Complex Staple / Suture removal (26 or more) []  - 0 Hypo / Hyperglycemic Management (close monitor of Blood Glucose) []  - 0 Ankle / Brachial Index (ABI) - do not check if billed separately Has the patient been seen at the hospital within the last three years: Yes Total Score: 70 Level Of Care: New/Established - Level 2 Electronic Signature(s) Signed: 01/01/2024 8:27:37 AM By: Jonathon Sailors RN Entered By: Jonathon Snow on 12/31/2023 07:27:15 -------------------------------------------------------------------------------- Encounter Discharge Information Details Patient Name: Date of Service: Jonathon Snow 12/31/2023 9:30 A M Medical Record Number: [id number] Patient Account Number: [id number] Date of Birth/Sex: Treating RN: 15-Nov-1961 (62 y.o. Jonathon Snow Jonathon Snow Primary Care Jonathon Snow: Jonathon Snow Other  Clinician: Referring Jonathon Snow: Treating Jonathon Snow/Extender: Jonathon Snow Jonathon Snow Weeks in Snow: 0 Encounter Discharge Information Items Discharge Condition: Stable Ambulatory Status: Walker Discharge Destination: Home Transportation: Private Auto Accompanied By: self Schedule Follow-up Appointment: No Clinical Summary of Care: Karapetian, Dardan Snow ([id number]) [id number] Page 4 of 6 Electronic Signature(s) Signed: 12/31/2023 10:28:54 AM By: Jonathon Sailors RN Entered By: Jonathon Snow on 12/31/2023 07:28:54 -------------------------------------------------------------------------------- Lower Extremity Assessment Details Patient Name: Date of Service: Jonathon Snow 12/31/2023 9:30 A M Medical Record Number: [id number] Patient Account Number: [id number] Date of Birth/Sex: Treating RN: 13-Mar-1961 (62 y.o. Jonathon Snow Jonathon Snow Primary Care Jullisa Grigoryan: Jonathon Snow Other Clinician: Referring Nickol Collister: Treating Iker Nuttall/Extender: Jonathon Snow Jonathon Snow Weeks in Snow: 0 Electronic Signature(s) Signed: 01/01/2024 8:27:37 AM By: Jonathon Sailors RN Entered By: Jonathon Snow on 12/31/2023 06:43:59 -------------------------------------------------------------------------------- Multi-Disciplinary Care Plan Details Patient Name: Date of Service: Jonathon Snow 12/31/2023 9:30 A M Medical Record Number: [id number] Patient Account Number: [id number] Date of Birth/Sex: Treating RN: 1961-08-03 (62 y.o. Jonathon Snow Jonathon Snow Primary Care Dericka Ostenson: Jonathon Snow Other Clinician: Referring Bing Duffey: Treating Demmi Sindt/Extender: Jonathon Snow Jonathon Snow Weeks in Snow: 0 Active Inactive Electronic Signature(s) Signed: 12/31/2023 10:27:42 AM By: Jonathon Sailors RN Entered By: Jonathon Snow on 12/31/2023 07:27:42 Jonathon Snow ([id number]) [id number] Page 5 of  6 -------------------------------------------------------------------------------- Pain Assessment Details Patient Name: Date of Service: Jonathon Snow, Jonathon Snow 12/31/2023 9:30 A M Medical Record Number: [id number] Patient Account Number: [id number] Date of Birth/Sex: Treating RN: April 04, 1961 (62 y.o. Jonathon Snow Jonathon Snow Primary Care Donata Reddick: Jonathon Snow Other Clinician: Referring Khalfani Weideman: Treating Cloma Rahrig/Extender: Jonathon Snow Jonathon Snow Weeks in Snow: 0 Active Problems Location of Pain Severity and Description of Pain Patient Has Paino No Site Locations Duration of the Pain. Constant / Intermittento Intermittent How Long Does it Lasto Hours: Minutes: 10 Rate the pain. Current Pain Level: 0 Worst Pain Level: 5 Least Pain Level: 0 Tolerable Pain Level: 5 Character of Pain Describe the Pain: Other: stinging Pain Management and Medication Current Pain Management: Medication: No  Cold Application: No Rest: No Massage: No Activity: No T.E.N.S.: No Heat Application: No Leg drop or elevation: No Is the Current Pain Management Adequate: Inadequate How does your wound impact your activities of daily livingo Sleep: Yes Bathing: No Appetite: No Relationship With Others: No Bladder Continence: No Emotions: No Bowel Continence: No Work: No Toileting: No Drive: No Dressing: No Hobbies: No Electronic Signature(s) Signed: 01/01/2024 8:27:37 AM By: Jonathon Sailors RN Entered By: Jonathon Snow on 12/31/2023 06:36:37 Jonathon Snow ([id number]) [id number] Page 6 of 6 -------------------------------------------------------------------------------- Patient/Caregiver Education Details Patient Name: Date of Service: Jonathon Snow, Jonathon RRY Snow. 1/9/2025andnbsp9:30 A M Medical Record Number: [id number] Patient Account Number: [id number] Date of Birth/Gender: Treating RN: 1961/07/12 (62 y.o. Jonathon Snow Jonathon Snow Primary Care Physician: Jonathon Snow Other  Clinician: Referring Physician: Treating Physician/Extender: Jonathon Snow Jonathon Snow Jonathon Snow in Snow: 0 Education Assessment Education Provided To: Patient Education Topics Provided Wound/Skin Impairment: Handouts: Other: discharge instructions Methods: Explain/Verbal, Printed Responses: State content correctly Electronic Signature(s) Signed: 01/01/2024 8:27:37 AM By: Jonathon Sailors RN Entered By: Jonathon Snow on 12/31/2023 07:28:12 -------------------------------------------------------------------------------- Vitals Details Patient Name: Date of Service: Jonathon Snow 12/31/2023 9:30 A M Medical Record Number: [id number] Patient Account Number: [id number] Date of Birth/Sex: Treating RN: May 03, 1961 (62 y.o. Jonathon Snow Jonathon Snow Primary Care Valerya Maxton: Jonathon Snow Other Clinician: Referring Kamuela Magos: Treating Yurani Fettes/Extender: Jonathon Snow Jonathon Snow Weeks in Snow: 0 Vital Signs Time Taken: 09:36 Temperature (F): 97.4 Height (in): 74 Pulse (bpm): 83 Source: Stated Respiratory Rate (breaths/min): 18 Weight (lbs): 230 Blood Pressure (mmHg): 155/106 Source: Stated Reference Range: 80 - 120 mg / dl Body Mass Index (BMI): 29.5 Electronic Signature(s) Signed: 01/01/2024 8:27:37 AM By: Jonathon Sailors RN Entered By: Jonathon Snow on 12/31/2023 06:37:06

## 2024-01-04 DIAGNOSIS — G8929 Other chronic pain: Secondary | ICD-10-CM | POA: Diagnosis not present

## 2024-01-04 DIAGNOSIS — M810 Age-related osteoporosis without current pathological fracture: Secondary | ICD-10-CM | POA: Diagnosis not present

## 2024-01-04 DIAGNOSIS — N1831 Chronic kidney disease, stage 3a: Secondary | ICD-10-CM | POA: Diagnosis not present

## 2024-01-04 DIAGNOSIS — N179 Acute kidney failure, unspecified: Secondary | ICD-10-CM | POA: Diagnosis not present

## 2024-01-04 DIAGNOSIS — I890 Lymphedema, not elsewhere classified: Secondary | ICD-10-CM | POA: Diagnosis not present

## 2024-01-04 DIAGNOSIS — M329 Systemic lupus erythematosus, unspecified: Secondary | ICD-10-CM | POA: Diagnosis not present

## 2024-01-04 DIAGNOSIS — Z86711 Personal history of pulmonary embolism: Secondary | ICD-10-CM | POA: Diagnosis not present

## 2024-01-04 DIAGNOSIS — I951 Orthostatic hypotension: Secondary | ICD-10-CM | POA: Diagnosis not present

## 2024-01-04 DIAGNOSIS — Z86718 Personal history of other venous thrombosis and embolism: Secondary | ICD-10-CM | POA: Diagnosis not present

## 2024-01-04 DIAGNOSIS — I130 Hypertensive heart and chronic kidney disease with heart failure and stage 1 through stage 4 chronic kidney disease, or unspecified chronic kidney disease: Secondary | ICD-10-CM | POA: Diagnosis not present

## 2024-01-04 DIAGNOSIS — S22079D Unspecified fracture of T9-T10 vertebra, subsequent encounter for fracture with routine healing: Secondary | ICD-10-CM | POA: Diagnosis not present

## 2024-01-04 DIAGNOSIS — Z7901 Long term (current) use of anticoagulants: Secondary | ICD-10-CM | POA: Diagnosis not present

## 2024-01-04 DIAGNOSIS — I2510 Atherosclerotic heart disease of native coronary artery without angina pectoris: Secondary | ICD-10-CM | POA: Diagnosis not present

## 2024-01-04 DIAGNOSIS — N39498 Other specified urinary incontinence: Secondary | ICD-10-CM | POA: Diagnosis not present

## 2024-01-04 DIAGNOSIS — J449 Chronic obstructive pulmonary disease, unspecified: Secondary | ICD-10-CM | POA: Diagnosis not present

## 2024-01-04 DIAGNOSIS — E785 Hyperlipidemia, unspecified: Secondary | ICD-10-CM | POA: Diagnosis not present

## 2024-01-04 DIAGNOSIS — F32A Depression, unspecified: Secondary | ICD-10-CM | POA: Diagnosis not present

## 2024-01-04 DIAGNOSIS — K219 Gastro-esophageal reflux disease without esophagitis: Secondary | ICD-10-CM | POA: Diagnosis not present

## 2024-01-04 DIAGNOSIS — F419 Anxiety disorder, unspecified: Secondary | ICD-10-CM | POA: Diagnosis not present

## 2024-01-04 DIAGNOSIS — Z48812 Encounter for surgical aftercare following surgery on the circulatory system: Secondary | ICD-10-CM | POA: Diagnosis not present

## 2024-01-04 DIAGNOSIS — Z955 Presence of coronary angioplasty implant and graft: Secondary | ICD-10-CM | POA: Diagnosis not present

## 2024-01-04 DIAGNOSIS — N401 Enlarged prostate with lower urinary tract symptoms: Secondary | ICD-10-CM | POA: Diagnosis not present

## 2024-01-04 DIAGNOSIS — I5032 Chronic diastolic (congestive) heart failure: Secondary | ICD-10-CM | POA: Diagnosis not present

## 2024-01-05 DIAGNOSIS — M329 Systemic lupus erythematosus, unspecified: Secondary | ICD-10-CM | POA: Diagnosis not present

## 2024-01-05 DIAGNOSIS — I10 Essential (primary) hypertension: Secondary | ICD-10-CM | POA: Diagnosis not present

## 2024-01-05 DIAGNOSIS — J449 Chronic obstructive pulmonary disease, unspecified: Secondary | ICD-10-CM | POA: Diagnosis not present

## 2024-01-05 DIAGNOSIS — S22079D Unspecified fracture of T9-T10 vertebra, subsequent encounter for fracture with routine healing: Secondary | ICD-10-CM | POA: Diagnosis not present

## 2024-01-05 DIAGNOSIS — F419 Anxiety disorder, unspecified: Secondary | ICD-10-CM | POA: Diagnosis not present

## 2024-01-05 DIAGNOSIS — M3214 Glomerular disease in systemic lupus erythematosus: Secondary | ICD-10-CM | POA: Diagnosis not present

## 2024-01-05 DIAGNOSIS — I5032 Chronic diastolic (congestive) heart failure: Secondary | ICD-10-CM | POA: Diagnosis not present

## 2024-01-05 DIAGNOSIS — M810 Age-related osteoporosis without current pathological fracture: Secondary | ICD-10-CM | POA: Diagnosis not present

## 2024-01-05 DIAGNOSIS — Z48812 Encounter for surgical aftercare following surgery on the circulatory system: Secondary | ICD-10-CM | POA: Diagnosis not present

## 2024-01-05 DIAGNOSIS — I890 Lymphedema, not elsewhere classified: Secondary | ICD-10-CM | POA: Diagnosis not present

## 2024-01-05 DIAGNOSIS — I2510 Atherosclerotic heart disease of native coronary artery without angina pectoris: Secondary | ICD-10-CM | POA: Diagnosis not present

## 2024-01-05 DIAGNOSIS — N39498 Other specified urinary incontinence: Secondary | ICD-10-CM | POA: Diagnosis not present

## 2024-01-05 DIAGNOSIS — N1831 Chronic kidney disease, stage 3a: Secondary | ICD-10-CM | POA: Diagnosis not present

## 2024-01-05 DIAGNOSIS — G8929 Other chronic pain: Secondary | ICD-10-CM | POA: Diagnosis not present

## 2024-01-05 DIAGNOSIS — Z86711 Personal history of pulmonary embolism: Secondary | ICD-10-CM | POA: Diagnosis not present

## 2024-01-05 DIAGNOSIS — N179 Acute kidney failure, unspecified: Secondary | ICD-10-CM | POA: Diagnosis not present

## 2024-01-05 DIAGNOSIS — I951 Orthostatic hypotension: Secondary | ICD-10-CM | POA: Diagnosis not present

## 2024-01-05 DIAGNOSIS — N401 Enlarged prostate with lower urinary tract symptoms: Secondary | ICD-10-CM | POA: Diagnosis not present

## 2024-01-05 DIAGNOSIS — E785 Hyperlipidemia, unspecified: Secondary | ICD-10-CM | POA: Diagnosis not present

## 2024-01-05 DIAGNOSIS — F32A Depression, unspecified: Secondary | ICD-10-CM | POA: Diagnosis not present

## 2024-01-05 DIAGNOSIS — Z955 Presence of coronary angioplasty implant and graft: Secondary | ICD-10-CM | POA: Diagnosis not present

## 2024-01-05 DIAGNOSIS — I130 Hypertensive heart and chronic kidney disease with heart failure and stage 1 through stage 4 chronic kidney disease, or unspecified chronic kidney disease: Secondary | ICD-10-CM | POA: Diagnosis not present

## 2024-01-05 DIAGNOSIS — K219 Gastro-esophageal reflux disease without esophagitis: Secondary | ICD-10-CM | POA: Diagnosis not present

## 2024-01-05 DIAGNOSIS — R809 Proteinuria, unspecified: Secondary | ICD-10-CM | POA: Diagnosis not present

## 2024-01-05 DIAGNOSIS — Z7901 Long term (current) use of anticoagulants: Secondary | ICD-10-CM | POA: Diagnosis not present

## 2024-01-05 DIAGNOSIS — Z86718 Personal history of other venous thrombosis and embolism: Secondary | ICD-10-CM | POA: Diagnosis not present

## 2024-01-07 ENCOUNTER — Ambulatory Visit: Payer: Medicaid Other | Admitting: Family Medicine

## 2024-01-07 ENCOUNTER — Encounter: Payer: Self-pay | Admitting: Family Medicine

## 2024-01-07 VITALS — BP 136/70 | HR 89 | Resp 16 | Ht 74.0 in | Wt 226.0 lb

## 2024-01-07 DIAGNOSIS — H538 Other visual disturbances: Secondary | ICD-10-CM | POA: Diagnosis not present

## 2024-01-07 DIAGNOSIS — Z87820 Personal history of traumatic brain injury: Secondary | ICD-10-CM

## 2024-01-07 DIAGNOSIS — R42 Dizziness and giddiness: Secondary | ICD-10-CM | POA: Diagnosis not present

## 2024-01-07 DIAGNOSIS — H109 Unspecified conjunctivitis: Secondary | ICD-10-CM

## 2024-01-07 MED ORDER — CROMOLYN SODIUM 4 % OP SOLN: 1.0000 [drp] | Freq: Four times a day (QID) | OPHTHALMIC | 2 refills | Status: DC | PRN

## 2024-01-07 MED ORDER — LORATADINE 10 MG PO TABS: 10.0000 mg | ORAL_TABLET | Freq: Every day | ORAL | 11 refills | Status: DC

## 2024-01-07 MED ORDER — OFLOXACIN 0.3 % OP SOLN: OPHTHALMIC | 0 refills | Status: DC

## 2024-01-07 NOTE — Patient Instructions (Signed)
If the pharmacy doesn't have cromolyn drops get the pataday drops (over the counter)  If any worsening please get appt with me or eye doctor ASAP and start the ofloxacin antibiotic drops   Otherwise do warm and/or cold compressess  Subconjunctival Hemorrhage Subconjunctival hemorrhage is bleeding that happens between the white part of your eye (sclera) and the clear membrane that covers the outside of your eye (conjunctiva). There are many tiny blood vessels near the surface of your eye. A subconjunctival hemorrhage happens when one or more of these vessels breaks and bleeds, causing a red patch to appear on your eye. This is similar to a bruise. Depending on the amount of bleeding, the red patch may only cover a small area of your eye or it may cover the entire visible part of the sclera. If a lot of blood collects under the conjunctiva, there may also be swelling. Subconjunctival hemorrhages do not affect your vision or cause pain, but your eye may feel irritated if there is swelling. Subconjunctival hemorrhages usually do not require treatment, and they usually disappear on their own within two to four weeks. What are the causes? This condition may be caused by: Mild trauma, such as rubbing your eye too hard. Blunt injuries, such as from playing sports or coming into contact with a deployed airbag. Coughing, sneezing, or vomiting. Straining, such as when lifting a heavy object. Medical conditions, such as: High blood pressure. Diabetes. Recent eye surgery. Certain medicines, especially blood thinners (anticoagulants), including aspirin. Other conditions, such as eye tumors, bleeding disorders, or blood vessel abnormalities. Subconjunctival hemorrhages can also happen without an obvious cause. What are the signs or symptoms? Symptoms of this condition include: A bright red or dark red patch on the white part of the eye. The red area may: Spread out to cover a larger area of the eye before  it goes away. Turn colors such as pink or brownish-yellow before it goes away. Swelling around the eye. Mild eye irritation. How is this diagnosed? This condition is diagnosed with a physical exam. If your subconjunctival hemorrhage was caused by trauma, your health care provider may refer you to an eye specialist (ophthalmologist) or another specialist to check for other injuries. You may have other tests, including: An eye exam including a vision test, checking your eye with a type of microscope (slit lamp) and measuring the pressure in your eye. Your eye may be dilated, especially if your subconjunctival hemorrhage was caused by trauma. A blood pressure check. Blood tests to check for bleeding disorders. If your subconjunctival hemorrhage was caused by trauma, X-rays or a CT scan may be done to check for other injuries. How is this treated? Usually, treatment is not needed for this condition. If you have discomfort, your health care provider may recommend eye drops or cold compresses. Follow these instructions at home: Take over-the-counter and prescription medicines only as directed by your health care provider. Use eye drops or cold compresses to help with discomfort as directed by your health care provider. Avoid activities, things, and environments that may irritate or injure your eye. Keep all follow-up visits. This is important. Contact a health care provider if: You have pain in your eye. The bleeding does not go away within 4 weeks. You keep getting new subconjunctival hemorrhages. Get help right away if: Your vision changes, you have difficulty seeing, or you develop double vision. You suddenly develop severe sensitivity to light. You develop a severe headache, persistent vomiting, confusion, or abnormal tiredness (  lethargy). Your eye seems to bulge or protrude from your eye socket. You develop unexplained bruises on your body. You have unexplained bleeding in another area of  your body. These symptoms may represent a serious problem that is an emergency. Do not wait to see if the symptoms will go away. Get medical help right away. Call your local emergency services (911 in the [place] Do not drive yourself to the hospital. Summary Subconjunctival hemorrhage is bleeding that happens between the white part of your eye and the clear membrane that covers the outside of your eye. This condition is similar to a bruise. Subconjunctival hemorrhages usually do not require treatment, and they usually disappear on their own within two to four weeks. Use eye drops or cold compresses to help with discomfort as directed by your health care provider. This information is not intended to replace advice given to you by your health care provider. Make sure you discuss any questions you have with your health care provider. Document Revised: 02/11/2021 Document Reviewed: 02/13/2021 Elsevier Patient Education  2024 [employer].

## 2024-01-07 NOTE — Progress Notes (Signed)
Patient ID: Jonathon Snow, male    DOB: March 29, 1961, 63 y.o.   MRN: [id number]  PCP: Danelle Berry, PA-C  Chief Complaint  Patient presents with   Eye Drainage    Started yesterday, mostly R eye. Itching a little bit.    Subjective:   Jonathon Snow is a 63 y.o. male, presents to clinic with CC of the following:  HPI  Rigth eye itching, irritation, sometimes both eyes itch, right eye a little blurry vision, no pain Some scan discharge he thinks it was bloody, a little better today Hx of seasonal allergies, immunocompromised and chronically anticoagulated No current nasal sx, he's not on his xyzal right now  Recent MI/fall/spine fx and surgery (Nov 2024) He fell/syncope hit his head, since has had vertigo and he is afraid to fall again. Back surgery per neurosurgery, he's not seen neuro med MI with cath had stent placed    Patient Active Problem List   Diagnosis Date Noted   Anxiety 12/14/2023   Vertigo 11/23/2023   Chest pain 11/23/2023   CAD (coronary artery disease) 11/23/2023   HTN (hypertension) 11/23/2023   HLD (hyperlipidemia) 11/23/2023   Depression with anxiety 11/23/2023   Overweight (BMI 25.0-29.9) 11/23/2023   Chronic diastolic CHF (congestive heart failure) (HCC) 11/23/2023   BPH (benign prostatic hyperplasia) 11/23/2023   Acute urinary retention 11/11/2023   Unstable angina (HCC) 11/10/2023   Hematoma of groin 11/08/2023   Closed T10 spinal fracture (HCC) 11/06/2023   Closed tricolumnar fracture of thoracic vertebra (HCC) 11/06/2023   Thoracic spine instability 11/06/2023   Shock circulatory (HCC) 11/06/2023   Heart attack (HCC) 11/05/2023   STEMI (ST elevation myocardial infarction) (HCC) 11/05/2023   Head injury 11/05/2023   Chronic bilateral low back pain with left-sided sciatica 07/07/2023   Severe episode of recurrent major depressive disorder, 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   Neuropathy 07/07/2023   Hx of colonic polyps  07/01/2023   Adenomatous polyp of colon 07/01/2023   SLE (systemic lupus erythematosus related syndrome) (HCC) 02/19/2023   Closed left hip fracture, initial encounter (HCC) 02/18/2023   Pain and swelling of right lower extremity 02/03/2023   Erectile disorder 01/22/2023   Coronary artery disease of native artery of native heart with stable angina pectoris (HCC) 09/09/2022   Current moderate episode of major depressive disorder (HCC) 09/09/2022   Varicose 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 01/03/2022   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) 09/18/2021   Prediabetes 11/08/2020   Stage 3a chronic kidney disease (HCC) 11/08/2020   Seasonal allergies 11/08/2020   Rhinosinusitis 11/08/2020   Long term current use of systemic steroids 11/08/2020   Anticoagulant disorder (HCC) 11/07/2020   Senile purpura (HCC) 11/07/2020   History of DVT (deep vein thrombosis) 03/22/2020   MDD (major depressive disorder), recurrent episode, mild (HCC) 09/12/2019   Other spondylosis with radiculopathy, lumbar region 02/16/2019   Osteoporosis 10/12/2018   Chronic venous insufficiency 10/07/2018   Lymphedema 10/07/2018   SLE glomerulonephritis syndrome, 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HCC) 03/03/2018   Syncope and collapse 12/29/2017   Nausea & vomiting 05/07/2017   Acute renal failure with acute tubular necrosis superimposed on stage 3b chronic kidney disease (HCC) 04/27/2017   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) 10/29/2016   Mixed hyperlipidemia 01/15/2016   Primary hypertension 01/15/2016   Obesity (BMI 30.0-34.9) 01/15/2016   Long term current use of anticoagulant 10/04/2015   Systemic lupus erythematosus (HCC)  05/30/2015   COPD, moderate (HCC) 06/27/2014   History of pulmonary embolism 12/11/2013   Nodule of right lung 12/11/2013   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   Cystic disease of liver 08/21/2013      Current  Outpatient Medications:    albuterol (VENTOLIN HFA) 108 (90 Base) MCG/ACT inhaler, Inhale 2 puffs into the lungs every 6 (six) hours as needed for wheezing or shortness of breath., Disp: 18 g, Rfl: 2   apixaban (ELIQUIS) 2.5 MG TABS tablet, Take 1 tablet (2.5 mg total) by mouth 2 (two) times daily., Disp: 60 tablet, Rfl: 0   ARIPiprazole (ABILIFY) 2 MG tablet, Take 2 mg by mouth daily., Disp: , Rfl:    cholecalciferol (VITAMIN D3) 25 MCG (1000 UNIT) tablet, Take 1,000 Units by mouth daily., Disp: , Rfl:    clonazePAM (KLONOPIN) 0.5 MG tablet, Take 0.5 mg by mouth daily., Disp: , Rfl:    clopidogrel (PLAVIX) 75 MG tablet, Take 1 tablet (75 mg total) by mouth daily., Disp: 30 tablet, Rfl: 0   gabapentin (NEURONTIN) 100 MG capsule, Take 2 capsules (200 mg total) by mouth 3 (three) times daily., Disp: 180 capsule, Rfl: 2   hydrOXYzine (ATARAX) 25 MG tablet, Take 1 tablet (25 mg total) by mouth 2 (two) times daily as needed for anxiety, itching or nausea., Disp: , Rfl:    levocetirizine (XYZAL) 5 MG tablet, Take 1 tablet (5 mg total) by mouth every evening., Disp: 30 tablet, Rfl: 5   lidocaine (LIDODERM) 5 %, Place 2 patches onto the skin daily. Remove & Discard patch within 12 hours or as directed by MD, Disp: 30 patch, Rfl: 0   methocarbamol (ROBAXIN) 500 MG tablet, Take 1 tablet (500 mg total) by mouth every 8 (eight) hours as needed for muscle spasms., Disp: 60 tablet, Rfl: 1   metoprolol succinate (TOPROL-XL) 25 MG 24 hr tablet, Take 0.5 tablets (12.5 mg total) by mouth daily., Disp: 30 tablet, Rfl: 1   midodrine (PROAMATINE) 10 MG tablet, Take 1 tablet (10 mg total) by mouth 3 (three) times daily with meals. Skip the dose if SBP >140 mmHg, Disp: 270 tablet, Rfl: 3   montelukast (SINGULAIR) 10 MG tablet, Take 1 tablet (10 mg total) by mouth at bedtime., Disp: 90 tablet, Rfl: 3   Multiple Vitamins-Minerals (MULTIVITAMIN WITH MINERALS) tablet, Take 1 tablet by mouth daily., Disp: , Rfl:     mycophenolate (CELLCEPT) 500 MG tablet, Take 2 tablets (1,000 mg total) by mouth 2 (two) times daily. HOLD until neurosurgery is ok to restart this (waiting for incision to heal) (Patient taking differently: Take 500 mg by mouth 2 (two) times daily. HOLD until neurosurgery is ok to restart this (waiting for incision to heal)), Disp: , Rfl:    nystatin (MYCOSTATIN/NYSTOP) powder, Apply 1 Application topically 3 (three) times daily., Disp: 15 g, Rfl: 0   nystatin cream (MYCOSTATIN), Apply 1 Application topically 2 (two) times daily., Disp: 30 g, Rfl: 0   oxyCODONE (OXY IR/ROXICODONE) 5 MG immediate release tablet, Take 1-2 tablets (5-10 mg total) by mouth every 6 (six) hours as needed for moderate pain (pain score 4-6) or severe pain (pain score 7-10)., Disp: 30 tablet, Rfl: 0   rosuvastatin (CRESTOR) 20 MG tablet, Take 1 tablet (20 mg total) by mouth daily., Disp: 90 tablet, Rfl: 0   senna (SENOKOT) 8.6 MG TABS tablet, Take 2 tablets (17.2 mg total) by mouth at bedtime. Stop if loose stool, Disp: 60 tablet, Rfl: 0   sertraline (  ZOLOFT) 100 MG tablet, Take 1.5 tablets (150 mg total) by mouth daily., Disp: 90 tablet, Rfl: 0   tadalafil (CIALIS) 20 MG tablet, Take 1 tablet (20 mg total) by mouth daily as needed for erectile dysfunction. Avoid taking it if low blood pressure, Disp: , Rfl:    tamsulosin (FLOMAX) 0.4 MG CAPS capsule, Take 1 capsule (0.4 mg total) by mouth daily after supper., Disp: 30 capsule, Rfl: 0   Allergies  Allergen Reactions   Enalapril Other (See Comments)    Unknown reaction   Vicodin [Hydrocodone-Acetaminophen] Hives and Rash    Severe headaches (also) NAME BRAND ONLY PER PT CAN TAKE GENERIC     Social History   Tobacco Use   Smoking status: Never   Smokeless tobacco: Never  Vaping Use   Vaping status: Never Used  Substance Use Topics   Alcohol use: No   Drug use: No    Comment: PT DENIES      Chart Review Today: I personally reviewed active problem list,  medication list, allergies, family history, social history, health maintenance, notes from last encounter, lab results, imaging with the patient/caregiver today.   Review of Systems  Constitutional: Negative.   HENT: Negative.    Eyes: Negative.   Respiratory: Negative.    Cardiovascular: Negative.   Gastrointestinal: Negative.   Endocrine: Negative.   Genitourinary: Negative.   Musculoskeletal: Negative.   Skin: Negative.   Allergic/Immunologic: Negative.   Neurological: Negative.   Hematological: Negative.   Psychiatric/Behavioral: Negative.    All other systems reviewed and are negative.      Objective:   Vitals:   01/07/24 1107  BP: 136/70  Pulse: 89  Resp: 16  SpO2: 100%  Weight: 226 lb (102.5 kg)  Height: 6\' 2"  (1.88 m)    Body mass index is 29.02 kg/m.  Physical Exam Vitals and nursing note reviewed.  Constitutional:      General: He is not in acute distress.    Appearance: He is not toxic-appearing or diaphoretic. Ill appearance: chronically ill appearing. HENT:     Head: Normocephalic and atraumatic.     Right Ear: External ear normal.     Left Ear: External ear normal.     Nose: Nose normal.     Mouth/Throat:     Mouth: Mucous membranes are moist.  Eyes:     General: Lids are normal. No scleral icterus.       Right eye: No discharge.        Left eye: No discharge.     Extraocular Movements: Extraocular movements intact.     Right eye: Normal extraocular motion.     Left eye: Normal extraocular motion.     Conjunctiva/sclera:     Right eye: Right conjunctiva is injected. Hemorrhage present. No chemosis or exudate.    Left eye: Left conjunctiva is not injected. No chemosis, exudate or hemorrhage.    Pupils: Pupils are equal, round, and reactive to light.     Funduscopic exam:    Right eye: Red reflex present.        Left eye: Red reflex present. Cardiovascular:     Rate and Rhythm: Normal rate.  Pulmonary:     Effort: No respiratory distress.   Skin:    General: Skin is warm.  Neurological:     Mental Status: He is alert. Mental status is at baseline.     Gait: Gait abnormal (antalgic gait, using walker).     Comments: Bilateral hands tremor Needed  one person assist to get from chair to standing  Psychiatric:        Mood and Affect: Mood normal.        Behavior: Behavior normal.      Results for orders placed or performed in visit on 12/21/23  Basic metabolic panel   Collection Time: 12/21/23 12:12 PM  Result Value Ref Range   Glucose 99 70 - 99 mg/dL   BUN 16 8 - 27 mg/dL   Creatinine, Ser 1.61 (H) 0.76 - 1.27 mg/dL   eGFR 56 (L) >09 UE/AVW/0.98   BUN/Creatinine Ratio 11 10 - 24   Sodium 140 134 - 144 mmol/L   Potassium 4.2 3.5 - 5.2 mmol/L   Chloride 108 (H) 96 - 106 mmol/L   CO2 18 (L) 20 - 29 mmol/L   Calcium 9.3 8.6 - 10.2 mg/dL  CBC   Collection Time: 12/21/23 12:12 PM  Result Value Ref Range   WBC 4.3 3.4 - 10.8 x10E3/uL   RBC 4.13 (L) 4.14 - 5.80 x10E6/uL   Hemoglobin 11.9 (L) 13.0 - 17.7 g/dL   Hematocrit 11.9 14.7 - 51.0 %   MCV 92 79 - 97 fL   MCH 28.8 26.6 - 33.0 pg   MCHC 31.2 (L) 31.5 - 35.7 g/dL   RDW 82.9 56.2 - 13.0 %   Platelets 198 150 - 450 x10E3/uL       Assessment & Plan:     ICD-10-CM   1. Conjunctivitis of both eyes, unspecified conjunctivitis type  H10.9 cromolyn (OPTICROM) 4 % ophthalmic solution    loratadine (CLARITIN) 10 MG tablet    ofloxacin (OCUFLOX) 0.3 % ophthalmic solution    Ambulatory referral to Optometry   warm/cold compresses, can restart allergy pills, steroid nasal sprays and try cromolyn drop or if unavailable get pataday OTC    2. Blurry vision, right eye  H53.8 Ambulatory referral to Optometry   intermittent, warm/cold compresses, avoid itching, can use allergy drops, close f/up if any worsening, doubt abrasion (no pain)    3. Vertigo  R42 Ambulatory referral to Neurology   neuro + neuro PT, pt currently doing [facility] for spine fx/surgery    4. History of  recent traumatic injury of head  Z87.820 Ambulatory referral to Neurology   early november - syncope/fall with scalp lac, no skull fx of bleed, since has worse vertigo and ambulation, consult neuro/neuro PT          Danelle Berry, PA-C 01/07/24 11:15 AM

## 2024-01-11 ENCOUNTER — Ambulatory Visit: Payer: Medicaid Other | Admitting: Internal Medicine

## 2024-01-12 DIAGNOSIS — Z48812 Encounter for surgical aftercare following surgery on the circulatory system: Secondary | ICD-10-CM | POA: Diagnosis not present

## 2024-01-12 DIAGNOSIS — G8929 Other chronic pain: Secondary | ICD-10-CM | POA: Diagnosis not present

## 2024-01-12 DIAGNOSIS — N401 Enlarged prostate with lower urinary tract symptoms: Secondary | ICD-10-CM | POA: Diagnosis not present

## 2024-01-12 DIAGNOSIS — Z86711 Personal history of pulmonary embolism: Secondary | ICD-10-CM | POA: Diagnosis not present

## 2024-01-12 DIAGNOSIS — F32A Depression, unspecified: Secondary | ICD-10-CM | POA: Diagnosis not present

## 2024-01-12 DIAGNOSIS — M810 Age-related osteoporosis without current pathological fracture: Secondary | ICD-10-CM | POA: Diagnosis not present

## 2024-01-12 DIAGNOSIS — I890 Lymphedema, not elsewhere classified: Secondary | ICD-10-CM | POA: Diagnosis not present

## 2024-01-12 DIAGNOSIS — I130 Hypertensive heart and chronic kidney disease with heart failure and stage 1 through stage 4 chronic kidney disease, or unspecified chronic kidney disease: Secondary | ICD-10-CM | POA: Diagnosis not present

## 2024-01-12 DIAGNOSIS — I2510 Atherosclerotic heart disease of native coronary artery without angina pectoris: Secondary | ICD-10-CM | POA: Diagnosis not present

## 2024-01-12 DIAGNOSIS — M329 Systemic lupus erythematosus, unspecified: Secondary | ICD-10-CM | POA: Diagnosis not present

## 2024-01-12 DIAGNOSIS — S22079D Unspecified fracture of T9-T10 vertebra, subsequent encounter for fracture with routine healing: Secondary | ICD-10-CM | POA: Diagnosis not present

## 2024-01-12 DIAGNOSIS — N1831 Chronic kidney disease, stage 3a: Secondary | ICD-10-CM | POA: Diagnosis not present

## 2024-01-12 DIAGNOSIS — K219 Gastro-esophageal reflux disease without esophagitis: Secondary | ICD-10-CM | POA: Diagnosis not present

## 2024-01-12 DIAGNOSIS — J449 Chronic obstructive pulmonary disease, unspecified: Secondary | ICD-10-CM | POA: Diagnosis not present

## 2024-01-12 DIAGNOSIS — N179 Acute kidney failure, unspecified: Secondary | ICD-10-CM | POA: Diagnosis not present

## 2024-01-12 DIAGNOSIS — Z7901 Long term (current) use of anticoagulants: Secondary | ICD-10-CM | POA: Diagnosis not present

## 2024-01-12 DIAGNOSIS — I951 Orthostatic hypotension: Secondary | ICD-10-CM | POA: Diagnosis not present

## 2024-01-12 DIAGNOSIS — E785 Hyperlipidemia, unspecified: Secondary | ICD-10-CM | POA: Diagnosis not present

## 2024-01-12 DIAGNOSIS — Z86718 Personal history of other venous thrombosis and embolism: Secondary | ICD-10-CM | POA: Diagnosis not present

## 2024-01-12 DIAGNOSIS — F419 Anxiety disorder, unspecified: Secondary | ICD-10-CM | POA: Diagnosis not present

## 2024-01-12 DIAGNOSIS — I5032 Chronic diastolic (congestive) heart failure: Secondary | ICD-10-CM | POA: Diagnosis not present

## 2024-01-12 DIAGNOSIS — N39498 Other specified urinary incontinence: Secondary | ICD-10-CM | POA: Diagnosis not present

## 2024-01-12 DIAGNOSIS — Z955 Presence of coronary angioplasty implant and graft: Secondary | ICD-10-CM | POA: Diagnosis not present

## 2024-01-13 DIAGNOSIS — I890 Lymphedema, not elsewhere classified: Secondary | ICD-10-CM | POA: Diagnosis not present

## 2024-01-13 DIAGNOSIS — E785 Hyperlipidemia, unspecified: Secondary | ICD-10-CM | POA: Diagnosis not present

## 2024-01-13 DIAGNOSIS — I951 Orthostatic hypotension: Secondary | ICD-10-CM | POA: Diagnosis not present

## 2024-01-13 DIAGNOSIS — G8929 Other chronic pain: Secondary | ICD-10-CM | POA: Diagnosis not present

## 2024-01-13 DIAGNOSIS — F32A Depression, unspecified: Secondary | ICD-10-CM | POA: Diagnosis not present

## 2024-01-13 DIAGNOSIS — Z955 Presence of coronary angioplasty implant and graft: Secondary | ICD-10-CM | POA: Diagnosis not present

## 2024-01-13 DIAGNOSIS — K219 Gastro-esophageal reflux disease without esophagitis: Secondary | ICD-10-CM | POA: Diagnosis not present

## 2024-01-13 DIAGNOSIS — Z48812 Encounter for surgical aftercare following surgery on the circulatory system: Secondary | ICD-10-CM | POA: Diagnosis not present

## 2024-01-13 DIAGNOSIS — N179 Acute kidney failure, unspecified: Secondary | ICD-10-CM | POA: Diagnosis not present

## 2024-01-13 DIAGNOSIS — Z86718 Personal history of other venous thrombosis and embolism: Secondary | ICD-10-CM | POA: Diagnosis not present

## 2024-01-13 DIAGNOSIS — N401 Enlarged prostate with lower urinary tract symptoms: Secondary | ICD-10-CM | POA: Diagnosis not present

## 2024-01-13 DIAGNOSIS — N1831 Chronic kidney disease, stage 3a: Secondary | ICD-10-CM | POA: Diagnosis not present

## 2024-01-13 DIAGNOSIS — I130 Hypertensive heart and chronic kidney disease with heart failure and stage 1 through stage 4 chronic kidney disease, or unspecified chronic kidney disease: Secondary | ICD-10-CM | POA: Diagnosis not present

## 2024-01-13 DIAGNOSIS — F419 Anxiety disorder, unspecified: Secondary | ICD-10-CM | POA: Diagnosis not present

## 2024-01-13 DIAGNOSIS — Z86711 Personal history of pulmonary embolism: Secondary | ICD-10-CM | POA: Diagnosis not present

## 2024-01-13 DIAGNOSIS — M810 Age-related osteoporosis without current pathological fracture: Secondary | ICD-10-CM | POA: Diagnosis not present

## 2024-01-13 DIAGNOSIS — M329 Systemic lupus erythematosus, unspecified: Secondary | ICD-10-CM | POA: Diagnosis not present

## 2024-01-13 DIAGNOSIS — S22079D Unspecified fracture of T9-T10 vertebra, subsequent encounter for fracture with routine healing: Secondary | ICD-10-CM | POA: Diagnosis not present

## 2024-01-13 DIAGNOSIS — I5032 Chronic diastolic (congestive) heart failure: Secondary | ICD-10-CM | POA: Diagnosis not present

## 2024-01-13 DIAGNOSIS — N39498 Other specified urinary incontinence: Secondary | ICD-10-CM | POA: Diagnosis not present

## 2024-01-13 DIAGNOSIS — Z7901 Long term (current) use of anticoagulants: Secondary | ICD-10-CM | POA: Diagnosis not present

## 2024-01-13 DIAGNOSIS — J449 Chronic obstructive pulmonary disease, unspecified: Secondary | ICD-10-CM | POA: Diagnosis not present

## 2024-01-13 DIAGNOSIS — I2510 Atherosclerotic heart disease of native coronary artery without angina pectoris: Secondary | ICD-10-CM | POA: Diagnosis not present

## 2024-01-14 ENCOUNTER — Other Ambulatory Visit: Payer: Self-pay | Admitting: Internal Medicine

## 2024-01-14 DIAGNOSIS — I10 Essential (primary) hypertension: Secondary | ICD-10-CM

## 2024-01-14 DIAGNOSIS — I2510 Atherosclerotic heart disease of native coronary artery without angina pectoris: Secondary | ICD-10-CM

## 2024-01-15 DIAGNOSIS — F32A Depression, unspecified: Secondary | ICD-10-CM | POA: Diagnosis not present

## 2024-01-15 DIAGNOSIS — N39498 Other specified urinary incontinence: Secondary | ICD-10-CM | POA: Diagnosis not present

## 2024-01-15 DIAGNOSIS — E785 Hyperlipidemia, unspecified: Secondary | ICD-10-CM | POA: Diagnosis not present

## 2024-01-15 DIAGNOSIS — I5032 Chronic diastolic (congestive) heart failure: Secondary | ICD-10-CM | POA: Diagnosis not present

## 2024-01-15 DIAGNOSIS — Z86711 Personal history of pulmonary embolism: Secondary | ICD-10-CM | POA: Diagnosis not present

## 2024-01-15 DIAGNOSIS — K219 Gastro-esophageal reflux disease without esophagitis: Secondary | ICD-10-CM | POA: Diagnosis not present

## 2024-01-15 DIAGNOSIS — I890 Lymphedema, not elsewhere classified: Secondary | ICD-10-CM | POA: Diagnosis not present

## 2024-01-15 DIAGNOSIS — J449 Chronic obstructive pulmonary disease, unspecified: Secondary | ICD-10-CM | POA: Diagnosis not present

## 2024-01-15 DIAGNOSIS — M329 Systemic lupus erythematosus, unspecified: Secondary | ICD-10-CM | POA: Diagnosis not present

## 2024-01-15 DIAGNOSIS — G8929 Other chronic pain: Secondary | ICD-10-CM | POA: Diagnosis not present

## 2024-01-15 DIAGNOSIS — Z86718 Personal history of other venous thrombosis and embolism: Secondary | ICD-10-CM | POA: Diagnosis not present

## 2024-01-15 DIAGNOSIS — S22079D Unspecified fracture of T9-T10 vertebra, subsequent encounter for fracture with routine healing: Secondary | ICD-10-CM | POA: Diagnosis not present

## 2024-01-15 DIAGNOSIS — I130 Hypertensive heart and chronic kidney disease with heart failure and stage 1 through stage 4 chronic kidney disease, or unspecified chronic kidney disease: Secondary | ICD-10-CM | POA: Diagnosis not present

## 2024-01-15 DIAGNOSIS — N179 Acute kidney failure, unspecified: Secondary | ICD-10-CM | POA: Diagnosis not present

## 2024-01-15 DIAGNOSIS — Z7901 Long term (current) use of anticoagulants: Secondary | ICD-10-CM | POA: Diagnosis not present

## 2024-01-15 DIAGNOSIS — N401 Enlarged prostate with lower urinary tract symptoms: Secondary | ICD-10-CM | POA: Diagnosis not present

## 2024-01-15 DIAGNOSIS — I2510 Atherosclerotic heart disease of native coronary artery without angina pectoris: Secondary | ICD-10-CM | POA: Diagnosis not present

## 2024-01-15 DIAGNOSIS — N1831 Chronic kidney disease, stage 3a: Secondary | ICD-10-CM | POA: Diagnosis not present

## 2024-01-15 DIAGNOSIS — I951 Orthostatic hypotension: Secondary | ICD-10-CM | POA: Diagnosis not present

## 2024-01-15 DIAGNOSIS — Z955 Presence of coronary angioplasty implant and graft: Secondary | ICD-10-CM | POA: Diagnosis not present

## 2024-01-15 DIAGNOSIS — F419 Anxiety disorder, unspecified: Secondary | ICD-10-CM | POA: Diagnosis not present

## 2024-01-15 DIAGNOSIS — M810 Age-related osteoporosis without current pathological fracture: Secondary | ICD-10-CM | POA: Diagnosis not present

## 2024-01-15 DIAGNOSIS — Z48812 Encounter for surgical aftercare following surgery on the circulatory system: Secondary | ICD-10-CM | POA: Diagnosis not present

## 2024-01-15 NOTE — Telephone Encounter (Signed)
Requested Prescriptions  Pending Prescriptions Disp Refills   clopidogrel (PLAVIX) 75 MG tablet [Pharmacy Med Name: CLOPIDOGREL 75MG  TABLETS] 30 tablet 0    Sig: TAKE 1 TABLET(75 MG) BY MOUTH DAILY     Hematology: Antiplatelets - clopidogrel Failed - 01/15/2024 11:12 AM      Failed - HGB in normal range and within 180 days    Hemoglobin  Date Value Ref Range Status  12/21/2023 11.9 (L) 13.0 - 17.7 g/dL Final         Failed - Cr in normal range and within 360 days    Creat  Date Value Ref Range Status  12/14/2023 1.38 (H) 0.70 - 1.35 mg/dL Final   Creatinine, Ser  Date Value Ref Range Status  12/21/2023 1.41 (H) 0.76 - 1.27 mg/dL Final   Creatinine, Urine  Date Value Ref Range Status  11/08/2023 153 mg/dL Final         Passed - HCT in normal range and within 180 days    Hematocrit  Date Value Ref Range Status  12/21/2023 38.1 37.5 - 51.0 % Final         Passed - PLT in normal range and within 180 days    Platelets  Date Value Ref Range Status  12/21/2023 198 150 - 450 x10E3/uL Final         Passed - Valid encounter within last 6 months    Recent Outpatient Visits           1 week ago Conjunctivitis of both eyes, unspecified conjunctivitis type   [facility] Danelle Berry, PA-C   1 month ago Hospital discharge follow-up   [facility] Margarita Mail, DO   2 months ago Polyneuropathy   [facility] Berniece Salines, FNP   6 months ago Primary hypertension   [facility] Danelle Berry, PA-C   6 months ago Severe episode of recurrent major depressive disorder, without psychotic features [facility])   [facility] Danelle Berry, PA-C       Future Appointments             In 3 days Margarita Mail, DO [facility]   In 5 days End, Cristal Deer, MD [facility] [place]   In 2 months Vanna Scotland, MD [facility] [place]

## 2024-01-18 ENCOUNTER — Telehealth (INDEPENDENT_AMBULATORY_CARE_PROVIDER_SITE_OTHER): Payer: Self-pay

## 2024-01-18 ENCOUNTER — Encounter: Payer: Self-pay | Admitting: Internal Medicine

## 2024-01-18 ENCOUNTER — Ambulatory Visit: Payer: Medicaid Other | Admitting: Internal Medicine

## 2024-01-18 ENCOUNTER — Other Ambulatory Visit: Payer: Self-pay | Admitting: Internal Medicine

## 2024-01-18 ENCOUNTER — Telehealth: Payer: Self-pay | Admitting: Emergency Medicine

## 2024-01-18 ENCOUNTER — Ambulatory Visit
Admission: RE | Admit: 2024-01-18 | Discharge: 2024-01-18 | Disposition: A | Discharge status: Home / Self Care | Payer: Medicaid Other | Source: Ambulatory Visit | Attending: Internal Medicine | Admitting: Internal Medicine

## 2024-01-18 ENCOUNTER — Other Ambulatory Visit: Payer: Self-pay

## 2024-01-18 VITALS — BP 142/86 | HR 78 | Temp 97.9°F | Resp 18 | Ht 74.0 in | Wt 227.1 lb

## 2024-01-18 DIAGNOSIS — I10 Essential (primary) hypertension: Secondary | ICD-10-CM

## 2024-01-18 DIAGNOSIS — M7989 Other specified soft tissue disorders: Secondary | ICD-10-CM | POA: Diagnosis not present

## 2024-01-18 DIAGNOSIS — I82412 Acute embolism and thrombosis of left femoral vein: Secondary | ICD-10-CM

## 2024-01-18 DIAGNOSIS — I82432 Acute embolism and thrombosis of left popliteal vein: Secondary | ICD-10-CM | POA: Diagnosis not present

## 2024-01-18 DIAGNOSIS — M79605 Pain in left leg: Secondary | ICD-10-CM | POA: Diagnosis not present

## 2024-01-18 MED ORDER — FUROSEMIDE 20 MG PO TABS: 10.0000 mg | ORAL_TABLET | Freq: Every day | ORAL | 0 refills | Status: DC

## 2024-01-18 MED ORDER — APIXABAN (ELIQUIS) VTE STARTER PACK (10MG AND 5MG): ORAL_TABLET | ORAL | 0 refills | Status: DC

## 2024-01-18 NOTE — Telephone Encounter (Signed)
Patient notified of protocol Dr.Andrews stated. Will make appointment for 1 week.

## 2024-01-18 NOTE — Telephone Encounter (Signed)
-----   Message from Margarita Mail sent at 01/18/2024  2:32 PM EST ----- Regarding: DVT Please let Jonathon Snow know that he does have a DVT. I am sending in an Eliquis starter pack - take this instead of regular Eliquis dose. Please have him schedule here in 1 week for follow up. If he has any new onset leg pain, shortness of breath, etc he needs to go to the ER.

## 2024-01-18 NOTE — Patient Instructions (Signed)
It was great seeing you today!  Plan discussed at today's visit: -[place] ordered today to rule out blood clot -Continue to keep legs elevated, use compression stockings and start Lasix 10 mg daily for 2 weeks. Keep legs clean and moisturized, recommend [employer] for diabetics.   Follow up in: 2 weeks   Take care and let us know if you have any questions or concerns prior to your next visit.  Dr. Caralee Ates

## 2024-01-18 NOTE — Telephone Encounter (Signed)
Patient called stating he is having swelling in his leg, ankle and foot. I called back to get information and he stated his primary got him in to a [place] and [facility]. He say the result show he has a blood clot.   Please advice

## 2024-01-18 NOTE — Progress Notes (Signed)
Established Patient Office Visit  Subjective   Patient ID: Jonathon Snow, male    DOB: 1961/09/25  Age: 63 y.o. MRN: [id number]  Chief Complaint  Patient presents with   Follow-up    4 week recheck    HPI  Patient is here for follow up for recheck however he is having left lower extremity edema which has been going almost a week. No swelling in the right lower extremity, does have a history of blood clots, is currently anticoagulated but has history of surgeries involving the left lower extremity as well. Skin now discolored, erythema and dry skin, no blistering or breaks in the skin. No fevers but does have pain.    Review of Systems  Constitutional:  Negative for chills and fever.  Respiratory:  Negative for shortness of breath.   Cardiovascular:  Positive for leg swelling. Negative for chest pain.  Skin:  Positive for rash.      Objective:     BP (!) 142/86 (Cuff Size: Large)   Pulse 78   Temp 97.9 F (36.6 C)   Resp 18   Ht 6\' 2"  (1.88 m)   Wt 227 lb 1.6 oz (103 kg)   SpO2 98%   BMI 29.16 kg/m  BP Readings from Last 3 Encounters:  01/18/24 (!) 142/86  01/07/24 136/70  12/22/23 138/84   Wt Readings from Last 3 Encounters:  01/18/24 227 lb 1.6 oz (103 kg)  01/07/24 226 lb (102.5 kg)  12/22/23 227 lb (103 kg)      Physical Exam Constitutional:      Appearance: Normal appearance.  HENT:     Head: Normocephalic and atraumatic.  Eyes:     Conjunctiva/sclera: Conjunctivae normal.  Cardiovascular:     Rate and Rhythm: Normal rate and regular rhythm.  Pulmonary:     Effort: Pulmonary effort is normal.     Breath sounds: Normal breath sounds.  Musculoskeletal:     Right lower leg: No edema.     Left lower leg: Edema present.     Comments: 2+ pitting edema in the left lower extremity with dry skin and erythema, no current infection or wounds  Skin:    General: Skin is warm and dry.     Findings: Rash present.     Comments: Macular erythematous rash on  bilateral upper extremity  Neurological:     General: No focal deficit present.     Mental Status: He is alert. Mental status is at baseline.  Psychiatric:        Mood and Affect: Mood normal.        Behavior: Behavior normal.      No results found for any visits on 01/18/24.  Last CBC Lab Results  Component Value Date   WBC 4.3 12/21/2023   HGB 11.9 (L) 12/21/2023   HCT 38.1 12/21/2023   MCV 92 12/21/2023   MCH 28.8 12/21/2023   RDW 14.5 12/21/2023   PLT 198 12/21/2023   Last metabolic panel Lab Results  Component Value Date   GLUCOSE 99 12/21/2023   NA 140 12/21/2023   K 4.2 12/21/2023   CL 108 (H) 12/21/2023   CO2 18 (L) 12/21/2023   BUN 16 12/21/2023   CREATININE 1.41 (H) 12/21/2023   EGFR 56 (L) 12/21/2023   CALCIUM 9.3 12/21/2023   PHOS 3.8 11/06/2023   PROT 6.1 (L) 11/24/2023   ALBUMIN 2.9 (L) 11/24/2023   LABGLOB 3.0 04/17/2016   AGRATIO 1.3 04/17/2016   BILITOT  1.2 (H) 11/24/2023   ALKPHOS 207 (H) 11/24/2023   AST 15 11/24/2023   ALT 11 11/24/2023   ANIONGAP 9 11/25/2023   Last lipids Lab Results  Component Value Date   CHOL 199 11/05/2023   HDL 59 11/05/2023   LDLCALC 119 (H) 11/05/2023   TRIG 105 11/05/2023   CHOLHDL 3.4 11/05/2023   Last hemoglobin A1c Lab Results  Component Value Date   HGBA1C 6.0 (H) 10/28/2023   Last thyroid functions Lab Results  Component Value Date   TSH 1.06 10/28/2023   Last vitamin D Lab Results  Component Value Date   VD25OH 35 11/15/2018   Last vitamin B12 and Folate Lab Results  Component Value Date   VITAMINB12 274 10/28/2023      The ASCVD Risk score (Arnett DK, et al., 2019) failed to calculate for the following reasons:   Risk score cannot be calculated because patient has a medical history suggesting prior/existing ASCVD    Assessment & Plan:   1. Swelling of left lower extremity (Primary): Will send for stat US for DVT rule out due to unilateral swelling. On anticoagulation. Discussed  keeping the leg elevated, using compression stockings, only drinking when thirsty and avoiding sodium. Will start Lasix 10 mg daily and plan to recheck in 2 weeks. Renal function appropriate on labs from 01/05/24.   - furosemide (LASIX) 20 MG tablet; Take 0.5 tablets (10 mg total) by mouth daily for 14 days.  Dispense: 30 tablet; Refill: 0 - US Venous Img Lower Unilateral Left (DVT); Future  2. Primary hypertension: BP up slightly today but in pain, will make no changes to medications today and recheck at follow up.    Return in about 2 weeks (around 02/01/2024) for me or PCP.    Margarita Mail, DO

## 2024-01-19 NOTE — Telephone Encounter (Signed)
He said he was only worried about the treatment. He stated the last time he was treated with lovenox. I informed him that Dr.Dew wants him to follow up with his PCP about hematology do a hypercoagulation work up on him.   He confirmed he understands.

## 2024-01-19 NOTE — Telephone Encounter (Signed)
Per Dr. Wyn Quaker, nothing to be done since his PCP has placed him back onto full dose eliquis (5mg  bid).  He doesn't recommend any intervention but would discuss with PCP about having hematology do a hypercoag workup given that he may have failed eliquis.  We can keep his current f/u.  He should elevate leg

## 2024-01-20 ENCOUNTER — Other Ambulatory Visit
Admission: RE | Admit: 2024-01-20 | Discharge: 2024-01-20 | Disposition: A | Discharge status: Home / Self Care | Payer: Medicaid Other | Source: Ambulatory Visit | Attending: Internal Medicine | Admitting: Internal Medicine

## 2024-01-20 ENCOUNTER — Ambulatory Visit
Admission: RE | Admit: 2024-01-20 | Discharge: 2024-01-20 | Disposition: A | Discharge status: Home / Self Care | Payer: Medicaid Other | Source: Ambulatory Visit | Attending: Internal Medicine | Admitting: Internal Medicine

## 2024-01-20 ENCOUNTER — Telehealth: Payer: Self-pay

## 2024-01-20 ENCOUNTER — Ambulatory Visit: Payer: Medicaid Other | Attending: Internal Medicine | Admitting: Internal Medicine

## 2024-01-20 ENCOUNTER — Encounter: Payer: Self-pay | Admitting: Internal Medicine

## 2024-01-20 VITALS — BP 150/100 | HR 79 | Ht 74.0 in | Wt 227.0 lb

## 2024-01-20 DIAGNOSIS — J90 Pleural effusion, not elsewhere classified: Secondary | ICD-10-CM | POA: Diagnosis not present

## 2024-01-20 DIAGNOSIS — R42 Dizziness and giddiness: Secondary | ICD-10-CM | POA: Diagnosis not present

## 2024-01-20 DIAGNOSIS — I2510 Atherosclerotic heart disease of native coronary artery without angina pectoris: Secondary | ICD-10-CM | POA: Diagnosis not present

## 2024-01-20 DIAGNOSIS — R0602 Shortness of breath: Secondary | ICD-10-CM

## 2024-01-20 DIAGNOSIS — I82402 Acute embolism and thrombosis of unspecified deep veins of left lower extremity: Secondary | ICD-10-CM | POA: Insufficient documentation

## 2024-01-20 DIAGNOSIS — R079 Chest pain, unspecified: Secondary | ICD-10-CM | POA: Diagnosis not present

## 2024-01-20 DIAGNOSIS — I5032 Chronic diastolic (congestive) heart failure: Secondary | ICD-10-CM

## 2024-01-20 DIAGNOSIS — I700 Atherosclerosis of aorta: Secondary | ICD-10-CM | POA: Diagnosis not present

## 2024-01-20 DIAGNOSIS — I25119 Atherosclerotic heart disease of native coronary artery with unspecified angina pectoris: Secondary | ICD-10-CM | POA: Diagnosis not present

## 2024-01-20 DIAGNOSIS — I951 Orthostatic hypotension: Secondary | ICD-10-CM

## 2024-01-20 DIAGNOSIS — K769 Liver disease, unspecified: Secondary | ICD-10-CM | POA: Diagnosis not present

## 2024-01-20 LAB — BASIC METABOLIC PANEL
Anion gap: 11 (ref 5–15)
BUN: 15 mg/dL (ref 8–23)
CO2: 21 mmol/L — ABNORMAL LOW (ref 22–32)
Calcium: 9.2 mg/dL (ref 8.9–10.3)
Chloride: 106 mmol/L (ref 98–111)
Creatinine, Ser: 1.35 mg/dL — ABNORMAL HIGH (ref 0.61–1.24)
GFR, Estimated: 59 mL/min — ABNORMAL LOW (ref >60)
Glucose, Bld: 111 mg/dL — ABNORMAL HIGH (ref 70–99)
Potassium: 4 mmol/L (ref 3.5–5.1)
Sodium: 138 mmol/L (ref 135–145)

## 2024-01-20 MED ORDER — METOPROLOL SUCCINATE ER 25 MG PO TB24: 25.0000 mg | ORAL_TABLET | Freq: Every day | ORAL | 3 refills | Status: AC

## 2024-01-20 MED ORDER — IOHEXOL 350 MG/ML SOLN
75.0000 mL | Freq: Once | INTRAVENOUS | Status: AC | PRN
  Administered 2024-01-20: 75 mL via INTRAVENOUS

## 2024-01-20 NOTE — Progress Notes (Signed)
Cardiology Office Note:  .   Date:  01/20/2024  ID:  Jonathon Snow, DOB Sep 13, 1961, MRN [id number] PCP: Danelle Berry, PA-C  [facility] Providers Cardiologist:  Yvonne Kendall, MD     History of Present Illness: .   Jonathon Snow is a 63 y.o. male with history of multivessel CAD status post PCI to the distal LCx in the setting of inferior STEMI in 10/2023 in the setting of syncope and T12 Chance fracture, chronic HFpEF, hypertension, hyperlipidemia, DVT/PE, venous sinus thrombosis, lupus, COPD, and chronic kidney disease stage IIIb, who returns for follow-up of CAD.  He was last seen in our office on 12/21/2023 after he was seen in the ED in early December due to chest pain, back pain, vomiting, and dizziness.  Workup in the ED was unrevealing.  Amlodipine and losartan were discontinued.  At the time of his follow-up visit, he continued to complain of orthostatic dizziness, albeit somewhat better with midodrine.  He noted occasional chest pain with heavy exertion.  Today, Jonathon Snow reports that he has been feeling worse.  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  He had remained on apixaban 2.5 mg twice daily but is now taking 10 mg twice daily.  He also continues on clopidogrel.  He has noticed some increased chest pain that is sometimes exertional and sometimes also occurs at rest.  It is not worsened with deep inspiration but seems to be more pronounced when he lies on his left side.  He describes it as a tightening sensation.  He also continues to be quite short of breath with modest activity.  He also has quite a bit of back pain and stiffness, continuing to work with Dr. Marcell Snow in the neurosurgery clinic.  He has frequently felt dizzy, both when standing up and lying down, described a sensation of things spinning around him.  He has not passed out or fallen.  He has not taken any  of his midodrine as his home blood pressures are typically at least 120 to 130 mmHg systolic.  He never wound up wearing the event monitor ordered at the time of his hospital discharge in November.  ROS: See HPI  Studies Reviewed: [name]   EKG Interpretation Date/Time:  Wednesday January 20 2024 11:21:11 EST Ventricular Rate:  79 PR Interval:  156 QRS Duration:  88 QT Interval:  384 QTC Calculation: 440 R Axis:   41  Text Interpretation: Normal sinus rhythm Normal ECG When compared with ECG of 21-Dec-2023 11:27, No significant change was found Confirmed by Dennis Hegeman [phone]) on 01/20/2024 1:30:15 PM    TTE (11/06/2023):  1. Left ventricular ejection fraction, by estimation, is 55 to 60%. The  left ventricle has normal function. The left ventricle has no regional  wall motion abnormalities. There is mild left ventricular hypertrophy.  Left ventricular diastolic parameters  are consistent with Grade I diastolic dysfunction (impaired relaxation).   2. Right ventricular systolic function is normal. The right ventricular  size is mildly enlarged. There is mildly elevated pulmonary artery  systolic pressure.   3. The mitral valve is normal in structure. No evidence of mitral valve  regurgitation.   4. The aortic valve is grossly normal. Aortic valve regurgitation is not  visualized.  LHC/PCI (11/05/2023): Severe multivessel coronary artery disease, as detailed above.  Culprit lesion for inferior STEMI appears to be  hazy 80-90% distal LCx stenosis.  There is also occlusion of the proximal RCA with bridging and left-to-right collaterals that could not be crossed with a wire suggesting chronic nature.  Moderate to severe but noncritical proximal/mid LAD and proximal ramus disease also present. Normal left ventricular filling pressure (LVEDP 14 mmHg). Successful IVUS-guided PCI to hazy 80-90% distal LCx stenosis using Onyx Frontier 3.0 x 30 mm drug-eluting stent with 0% residual stenosis and  TIMI-3 flow.  First medical contact to balloon time was delayed because of the patient's syncope and head trauma necessitating CT head and cervical spine in the ER prior to transport to the cath lab as well as multiple potential culprit lesions.  Risk Assessment/Calculations:        Physical Exam:   VS:  BP (!) 150/100 (BP Location: Left Arm, Patient Position: Sitting, Cuff Size: Normal)   Pulse 79   Ht 6\' 2"  (1.88 m)   Wt 227 lb (103 kg)   SpO2 99%   BMI 29.15 kg/m    Wt Readings from Last 3 Encounters:  01/20/24 227 lb (103 kg)  01/18/24 227 lb 1.6 oz (103 kg)  01/07/24 226 lb (102.5 kg)    General: Chronically ill-appearing man, seated on exam table. Neck: No JVD or HJR. Lungs: Mildly diminished breath sounds throughout without wheezes or crackles. Heart: Regular rate and rhythm without murmurs, rubs, or gallops. Abdomen: Soft, nontender, nondistended. Extremities: 2+ pitting left calf edema with mild erythema.  ASSESSMENT AND PLAN: .    Chest pain, shortness of breath, and left lower extremity DVT: Chest pain is atypical with both some anginal and nonanginal components.  Given his recurrent left lower extremity DVT despite being on low-dose apixaban, I am concerned that some of his symptoms could be due to recurrent PE.  We discussed risks and benefits of performing a CTA of the chest to document this.  While it would likely not change our treatment strategy of therapeutic apixaban, if there is a significant clot burden, thrombectomy +/- IVC filter placement would need to be considered.  I will check a BMP today.  If his renal function is stable, we will move ahead with CTA chest.  Continue apixaban 10 mg twice daily x 1 week followed by 5 mg twice daily thereafter.  I have also encouraged Jonathon Snow to schedule follow-up with Dr. Donneta Romberg at his earliest convenience to determine if apixaban remains an appropriate anticoagulation strategy given that he had recurrent DVT (albeit on  low-dose apixaban).  Coronary artery disease: Question if chest pain could also be related to residual CAD.  Jonathon Snow was noted to have CTO of the RCA as well as 70 to 80% stenosis of the mid LAD at the time of his catheterization and PCI to the LCx in November.  If his CT of the chest does not show evidence of PE or other finding to explain his chest pain, we may need to consider repeat ischemia evaluation.  In the setting of his acute DVT and need for aggressive anticoagulation, I would favor deferring catheterization unless he has refractory symptoms.  I will increase metoprolol succinate to 25 mg daily.  Consider myocardial PET/CT at follow-up for further evaluation.  For now, continue high-dose apixaban for treatment of recurrent DVT as well as clopidogrel 75 mg daily.  Ideally, would complete 12 months of apixaban and clopidogrel, though if bleeding issues arise, we could consider de-escalation of clopidogrel to aspirin at 3 from his PCI.  Continue rosuvastatin 20  mg daily for secondary prevention of CAD.  Chronic HFpEF and orthostatic hypotension: Jonathon Snow has asymmetric left lower extremity edema in the setting of his acute DVT but otherwise appears euvolemic.  I will continue furosemide 10 mg daily for now, though he may need dose escalation.  I worry about making him to intravascular volume depleted, particularly if he were to have a pulmonary embolism.  Continue as needed midodrine for orthostatic hypotension.  Dizziness: Some of this very well could be due to orthostatic hypotension though a central component also may be playing a role.  Consider consultation with neurology and/or ENT if symptoms persist.    Dispo: Return to clinic in 2 to 3 weeks.  If CTA chest demonstrates PE with significant clot burden, the patient will be referred to the ED for further evaluation and management, including vascular surgery consultation.  Signed, Yvonne Kendall, MD

## 2024-01-20 NOTE — Telephone Encounter (Signed)
Per Dr. Okey Dupre, the patient's BMP is stable and it is okay to proceed with the STAT CTA Chest. The nurse contacted scheduling, who confirmed they can perform the test today as a walk-in, and recommended the patient proceed to [facility]. The patient was contacted, made aware of the plan, and verbalized understanding. The patient will proceed to the testing site.

## 2024-01-20 NOTE — Patient Instructions (Signed)
Medication Instructions:  Your physician recommends the following medication changes.  INCREASE: Metoprolol to 25 mg by mouth daily   *If you need a refill on your cardiac medications before your next appointment, please call your pharmacy*   Lab Work: Your provider would like for you to have following labs drawn today (BMP).     Testing/Procedures: Chest CT Angiography (CTA), is a special type of CT scan that uses a computer to produce multi-dimensional views of major blood vessels throughout the body. In CT angiography, a contrast material is injected through an IV to help visualize the blood vessels    Follow-Up: At [facility], you and your health needs are our priority.  As part of our continuing mission to provide you with exceptional heart care, we have created designated Provider Care Teams.  These Care Teams include your primary Cardiologist (physician) and Advanced Practice Providers (APPs -  Physician Assistants and Nurse Practitioners) who all work together to provide you with the care you need, when you need it.  We recommend signing up for the patient portal called "MyChart".  Sign up information is provided on this After Visit Summary.  MyChart is used to connect with patients for Virtual Visits (Telemedicine).  Patients are able to view lab/test results, encounter notes, upcoming appointments, etc.  Non-urgent messages can be sent to your provider as well.   To learn more about what you can do with MyChart, go to [email or web address].    Your next appointment:   2-3 week(s)  Provider:   You may see Yvonne Kendall, MD or one of the following Advanced Practice Providers on your designated Care Team:   Nicolasa Ducking, NP Eula Listen, PA-C Cadence Fransico Michael, PA-C Charlsie Quest, NP Carlos Levering, NP

## 2024-01-21 DIAGNOSIS — I951 Orthostatic hypotension: Secondary | ICD-10-CM | POA: Diagnosis not present

## 2024-01-21 DIAGNOSIS — Z86711 Personal history of pulmonary embolism: Secondary | ICD-10-CM | POA: Diagnosis not present

## 2024-01-21 DIAGNOSIS — I2510 Atherosclerotic heart disease of native coronary artery without angina pectoris: Secondary | ICD-10-CM | POA: Diagnosis not present

## 2024-01-21 DIAGNOSIS — F32A Depression, unspecified: Secondary | ICD-10-CM | POA: Diagnosis not present

## 2024-01-21 DIAGNOSIS — N39498 Other specified urinary incontinence: Secondary | ICD-10-CM | POA: Diagnosis not present

## 2024-01-21 DIAGNOSIS — N401 Enlarged prostate with lower urinary tract symptoms: Secondary | ICD-10-CM | POA: Diagnosis not present

## 2024-01-21 DIAGNOSIS — Z955 Presence of coronary angioplasty implant and graft: Secondary | ICD-10-CM | POA: Diagnosis not present

## 2024-01-21 DIAGNOSIS — Z86718 Personal history of other venous thrombosis and embolism: Secondary | ICD-10-CM | POA: Diagnosis not present

## 2024-01-21 DIAGNOSIS — I130 Hypertensive heart and chronic kidney disease with heart failure and stage 1 through stage 4 chronic kidney disease, or unspecified chronic kidney disease: Secondary | ICD-10-CM | POA: Diagnosis not present

## 2024-01-21 DIAGNOSIS — I5032 Chronic diastolic (congestive) heart failure: Secondary | ICD-10-CM | POA: Diagnosis not present

## 2024-01-21 DIAGNOSIS — Z7901 Long term (current) use of anticoagulants: Secondary | ICD-10-CM | POA: Diagnosis not present

## 2024-01-21 DIAGNOSIS — N1831 Chronic kidney disease, stage 3a: Secondary | ICD-10-CM | POA: Diagnosis not present

## 2024-01-21 DIAGNOSIS — G8929 Other chronic pain: Secondary | ICD-10-CM | POA: Diagnosis not present

## 2024-01-21 DIAGNOSIS — Z48812 Encounter for surgical aftercare following surgery on the circulatory system: Secondary | ICD-10-CM | POA: Diagnosis not present

## 2024-01-21 DIAGNOSIS — K219 Gastro-esophageal reflux disease without esophagitis: Secondary | ICD-10-CM | POA: Diagnosis not present

## 2024-01-21 DIAGNOSIS — F419 Anxiety disorder, unspecified: Secondary | ICD-10-CM | POA: Diagnosis not present

## 2024-01-21 DIAGNOSIS — M329 Systemic lupus erythematosus, unspecified: Secondary | ICD-10-CM | POA: Diagnosis not present

## 2024-01-21 DIAGNOSIS — J449 Chronic obstructive pulmonary disease, unspecified: Secondary | ICD-10-CM | POA: Diagnosis not present

## 2024-01-21 DIAGNOSIS — S22079D Unspecified fracture of T9-T10 vertebra, subsequent encounter for fracture with routine healing: Secondary | ICD-10-CM | POA: Diagnosis not present

## 2024-01-21 DIAGNOSIS — N179 Acute kidney failure, unspecified: Secondary | ICD-10-CM | POA: Diagnosis not present

## 2024-01-21 DIAGNOSIS — E785 Hyperlipidemia, unspecified: Secondary | ICD-10-CM | POA: Diagnosis not present

## 2024-01-21 DIAGNOSIS — M810 Age-related osteoporosis without current pathological fracture: Secondary | ICD-10-CM | POA: Diagnosis not present

## 2024-01-21 DIAGNOSIS — I890 Lymphedema, not elsewhere classified: Secondary | ICD-10-CM | POA: Diagnosis not present

## 2024-01-22 ENCOUNTER — Ambulatory Visit: Payer: Self-pay

## 2024-01-22 DIAGNOSIS — M810 Age-related osteoporosis without current pathological fracture: Secondary | ICD-10-CM | POA: Diagnosis not present

## 2024-01-22 DIAGNOSIS — N179 Acute kidney failure, unspecified: Secondary | ICD-10-CM | POA: Diagnosis not present

## 2024-01-22 DIAGNOSIS — Z86718 Personal history of other venous thrombosis and embolism: Secondary | ICD-10-CM | POA: Diagnosis not present

## 2024-01-22 DIAGNOSIS — M329 Systemic lupus erythematosus, unspecified: Secondary | ICD-10-CM | POA: Diagnosis not present

## 2024-01-22 DIAGNOSIS — Z7901 Long term (current) use of anticoagulants: Secondary | ICD-10-CM | POA: Diagnosis not present

## 2024-01-22 DIAGNOSIS — I951 Orthostatic hypotension: Secondary | ICD-10-CM | POA: Diagnosis not present

## 2024-01-22 DIAGNOSIS — I890 Lymphedema, not elsewhere classified: Secondary | ICD-10-CM | POA: Diagnosis not present

## 2024-01-22 DIAGNOSIS — Z86711 Personal history of pulmonary embolism: Secondary | ICD-10-CM | POA: Diagnosis not present

## 2024-01-22 DIAGNOSIS — I2510 Atherosclerotic heart disease of native coronary artery without angina pectoris: Secondary | ICD-10-CM | POA: Diagnosis not present

## 2024-01-22 DIAGNOSIS — Z48812 Encounter for surgical aftercare following surgery on the circulatory system: Secondary | ICD-10-CM | POA: Diagnosis not present

## 2024-01-22 DIAGNOSIS — J449 Chronic obstructive pulmonary disease, unspecified: Secondary | ICD-10-CM | POA: Diagnosis not present

## 2024-01-22 DIAGNOSIS — I5032 Chronic diastolic (congestive) heart failure: Secondary | ICD-10-CM | POA: Diagnosis not present

## 2024-01-22 DIAGNOSIS — Z955 Presence of coronary angioplasty implant and graft: Secondary | ICD-10-CM | POA: Diagnosis not present

## 2024-01-22 DIAGNOSIS — F32A Depression, unspecified: Secondary | ICD-10-CM | POA: Diagnosis not present

## 2024-01-22 DIAGNOSIS — F419 Anxiety disorder, unspecified: Secondary | ICD-10-CM | POA: Diagnosis not present

## 2024-01-22 DIAGNOSIS — N401 Enlarged prostate with lower urinary tract symptoms: Secondary | ICD-10-CM | POA: Diagnosis not present

## 2024-01-22 DIAGNOSIS — G8929 Other chronic pain: Secondary | ICD-10-CM | POA: Diagnosis not present

## 2024-01-22 DIAGNOSIS — I130 Hypertensive heart and chronic kidney disease with heart failure and stage 1 through stage 4 chronic kidney disease, or unspecified chronic kidney disease: Secondary | ICD-10-CM | POA: Diagnosis not present

## 2024-01-22 DIAGNOSIS — N1831 Chronic kidney disease, stage 3a: Secondary | ICD-10-CM | POA: Diagnosis not present

## 2024-01-22 DIAGNOSIS — E785 Hyperlipidemia, unspecified: Secondary | ICD-10-CM | POA: Diagnosis not present

## 2024-01-22 DIAGNOSIS — S22079D Unspecified fracture of T9-T10 vertebra, subsequent encounter for fracture with routine healing: Secondary | ICD-10-CM | POA: Diagnosis not present

## 2024-01-22 DIAGNOSIS — N39498 Other specified urinary incontinence: Secondary | ICD-10-CM | POA: Diagnosis not present

## 2024-01-22 DIAGNOSIS — K219 Gastro-esophageal reflux disease without esophagitis: Secondary | ICD-10-CM | POA: Diagnosis not present

## 2024-01-22 NOTE — Telephone Encounter (Signed)
    Chief Complaint: Pt. Seen 01/18/24. Had a rash at that time which is spreading. Red, blotchy skin that itches and is painful. Asking for medication. Symptoms: Above Frequency: Last week Pertinent Negatives: Patient denies fever Disposition: [] ED /[] Urgent Care (no appt availability in office) / [] Appointment(In office/virtual)/ []  [facility]/ [] Home Care/ [] Refused Recommended Disposition /[] [facility]/ [x]  Follow-up with PCP Additional Notes: Please advise pt.  Reason for Disposition  Mild widespread rash  (Exception: Heat rash lasting 3 days or less.)  Answer Assessment - Initial Assessment Questions 1. APPEARANCE of RASH: "Describe the rash." (e.g., spots, blisters, raised areas, skin peeling, scaly)     Red, blotchy 2. SIZE: "How big are the spots?" (e.g., tip of pen, eraser, coin; inches, centimeters)     N/A 3. LOCATION: "Where is the rash located?"     Arms, trunk, shoulders 4. COLOR: "What color is the rash?" (Note: It is difficult to assess rash color in people with darker-colored skin. When this situation occurs, simply ask the caller to describe what they see.)     Red 5. ONSET: "When did the rash begin?"     Last week 6. FEVER: "Do you have a fever?" If Yes, ask: "What is your temperature, how was it measured, and when did it start?"     No 7. ITCHING: "Does the rash itch?" If Yes, ask: "How bad is the itch?" (Scale 1-10; or mild, moderate, severe)     Yes 8. CAUSE: "What do you think is causing the rash?"     Unsure 9. MEDICINE FACTORS: "Have you started any new medicines within the last 2 weeks?" (e.g., antibiotics)      No 10. OTHER SYMPTOMS: "Do you have any other symptoms?" (e.g., dizziness, headache, sore throat, joint pain)       No 11. PREGNANCY: "Is there any chance you are pregnant?" "When was your last menstrual period?"       N/a  Protocols used: Rash or Redness - [facility]

## 2024-01-22 NOTE — Telephone Encounter (Signed)
Lvm letting pt know that we are not able to combine the two visits for Monday. She is asking that you possibly schedule an appointment with her for this afternoon

## 2024-01-25 ENCOUNTER — Encounter: Payer: Self-pay | Admitting: Family Medicine

## 2024-01-25 ENCOUNTER — Ambulatory Visit: Payer: Medicaid Other | Admitting: Family Medicine

## 2024-01-25 VITALS — BP 138/82 | HR 92 | Resp 16 | Ht 74.0 in | Wt 227.0 lb

## 2024-01-25 DIAGNOSIS — R21 Rash and other nonspecific skin eruption: Secondary | ICD-10-CM

## 2024-01-25 DIAGNOSIS — Z7901 Long term (current) use of anticoagulants: Secondary | ICD-10-CM | POA: Diagnosis not present

## 2024-01-25 DIAGNOSIS — I82402 Acute embolism and thrombosis of unspecified deep veins of left lower extremity: Secondary | ICD-10-CM

## 2024-01-25 DIAGNOSIS — Z79899 Other long term (current) drug therapy: Secondary | ICD-10-CM | POA: Diagnosis not present

## 2024-01-25 DIAGNOSIS — M545 Low back pain, unspecified: Secondary | ICD-10-CM | POA: Diagnosis not present

## 2024-01-25 DIAGNOSIS — S91002A Unspecified open wound, left ankle, initial encounter: Secondary | ICD-10-CM | POA: Diagnosis not present

## 2024-01-25 DIAGNOSIS — I739 Peripheral vascular disease, unspecified: Secondary | ICD-10-CM

## 2024-01-25 DIAGNOSIS — M3214 Glomerular disease in systemic lupus erythematosus: Secondary | ICD-10-CM | POA: Diagnosis not present

## 2024-01-25 DIAGNOSIS — G8929 Other chronic pain: Secondary | ICD-10-CM

## 2024-01-25 DIAGNOSIS — D84821 Immunodeficiency due to drugs: Secondary | ICD-10-CM

## 2024-01-25 DIAGNOSIS — L309 Dermatitis, unspecified: Secondary | ICD-10-CM | POA: Diagnosis not present

## 2024-01-25 DIAGNOSIS — D699 Hemorrhagic condition, unspecified: Secondary | ICD-10-CM | POA: Diagnosis not present

## 2024-01-25 DIAGNOSIS — M79604 Pain in right leg: Secondary | ICD-10-CM | POA: Diagnosis not present

## 2024-01-25 MED ORDER — OXYCODONE-ACETAMINOPHEN 5-325 MG PO TABS: 1.0000 | ORAL_TABLET | Freq: Three times a day (TID) | ORAL | 0 refills | Status: AC | PRN

## 2024-01-25 MED ORDER — APIXABAN 5 MG PO TABS: 5.0000 mg | ORAL_TABLET | Freq: Two times a day (BID) | ORAL | 1 refills | Status: DC

## 2024-01-25 MED ORDER — PREDNISONE 10 MG PO TABS: ORAL_TABLET | ORAL | 0 refills | Status: DC

## 2024-01-25 MED ORDER — DOXYCYCLINE HYCLATE 100 MG PO TABS: 100.0000 mg | ORAL_TABLET | Freq: Two times a day (BID) | ORAL | 0 refills | Status: AC

## 2024-01-25 NOTE — Progress Notes (Signed)
Patient ID: Jonathon Snow, male    DOB: [birth year], 63 y.o.   MRN: [id number]  PCP: Danelle Berry, PA-C  Chief Complaint  Patient presents with   Leg Swelling    X2 weeks. L lower leg/ankle. Red and painful.     Subjective:   Jonathon Snow is a 63 y.o. 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 Caralee Ates and Dx with DVT Has been managed by vascular and hematology  Eliquis 10 mg BID tolerating, no bleeding  Leg swelling a little better, however pain worse and new redness worse especially with walkingpain and redness increase to foot and up leg, also peeling skin and wound to inner left lower leg. He has compression socks but cannot get them on right now, too swollen B/L leg pain with walking is from his back, but LLE pain is worse and begins after walking a short distance, it feels different than his back/leg/neurogenic pain Vascular said they do not need to see him since we diagnosed DVT - per pt Pt reports SOB unchanged from his baseline - CT angio done last week on 1/29 No CP   Last hem/onc was 03/2023 at that time was on 5 mg eliquis, Dr. Okey Dupre reduced DOAC to 2.5 mg   Rash to shoulder/arms vesicular itchy bilateral several weeks of rash blanching clusters, also patches and macules to arms dry   SLE/lupus nephritis Taken off prednisone by Dr. Okey Dupre in the hospital Nov - he hasn't seen nephrology since He also decreased other lupus meds and eliquis  Stopped losartan after hospitalization with NSTEMI  Last nephrology appt just 2 weeks ago Problem List: 1. Lupus Nephritis, underwent induction with cellcept x 1.5 years. Relapse 01/2017, restarted on cellcept. 2. Proteinuria. 3. Hypertension. 4. Positive ANA with positive double-stranded DNA, anti-chromatin antibodies, SSA antibody, and RNP antibody. 5. Systemic  lupus erythematosis. 6. Drug induced rash secondary to cytoxan. 7. History of anemia of CKD. 8. 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  9. History of herpes zoster infection due to immunosuppression. 10. Right axillary and left groin abscess 04/2017. 11. Syncope 10/18/17. 12. Episode of unresponsiveness/respiratory failure/acute renal failure 02/2018 13. L5-S1 spinal fusion 06/17/21.  14. Secondary hyperparathyroidism. 15. T7-12 posterior spinal fusion 11/06/2023.  Plan: The patient's renal function did fluctuate during his recent hospitalization. Most recent creatinine was 1.4 with EGFR up to 56. CellCept was reduced to 500 mg twice a day and he was taken off of prednisone to allow for better wound healing. He was also taken off of losartan. We plan to follow-up renal function, urine protein creatinine ratio, C3, C4, double-stranded DNA titer. Maintain the patient on current dosage of CellCept 500 mg twice a day. We will maintain the patient off of prednisone for now.  Return in about 3 months (around 04/04/2024).      Patient Active Problem List   Diagnosis Date Noted   Deep venous embolism and thrombosis of left lower extremity (HCC) 01/20/2024   Anxiety 12/14/2023   Dizziness 11/23/2023   Chest pain 11/23/2023   CAD (coronary artery disease) 11/23/2023   HTN (hypertension) 11/23/2023   HLD (hyperlipidemia) 11/23/2023   Depression with anxiety 11/23/2023   Overweight (BMI 25.0-29.9) 11/23/2023   Chronic heart failure with preserved ejection fraction (HFpEF) (HCC)  11/23/2023   BPH (benign prostatic hyperplasia) 11/23/2023   Acute urinary retention 11/11/2023   Unstable angina (HCC) 11/10/2023   Hematoma of groin 11/08/2023   Closed T10 spinal fracture (HCC) 11/06/2023   Closed tricolumnar fracture of thoracic vertebra (HCC) 11/06/2023   Thoracic spine instability 11/06/2023   Shock circulatory (HCC) 11/06/2023   Heart attack (HCC) 11/05/2023    STEMI (ST elevation myocardial infarction) (HCC) 11/05/2023   Head injury 11/05/2023   Chronic bilateral low back pain with left-sided sciatica 07/07/2023   Severe episode of recurrent major depressive disorder, 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   Neuropathy 07/07/2023   Hx of colonic polyps 07/01/2023   Adenomatous polyp of colon 07/01/2023   SLE (systemic lupus erythematosus related syndrome) (HCC) 02/19/2023   Closed left hip fracture, initial encounter (HCC) 02/18/2023   Pain and swelling of right lower extremity 02/03/2023   Erectile disorder 01/22/2023   Coronary artery disease of native artery of native heart with stable angina pectoris (HCC) 09/09/2022   Current moderate episode of major depressive disorder (HCC) 09/09/2022   Varicose 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 01/03/2022   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) 09/18/2021   Prediabetes 11/08/2020   Stage 3a chronic kidney disease (HCC) 11/08/2020   Seasonal allergies 11/08/2020   Rhinosinusitis 11/08/2020   Long term current use of systemic steroids 11/08/2020   Anticoagulant disorder (HCC) 11/07/2020   Senile purpura (HCC) 11/07/2020   History of DVT (deep vein thrombosis) 03/22/2020   MDD (major depressive disorder), recurrent episode, mild (HCC) 09/12/2019   Other spondylosis with radiculopathy, lumbar region 02/16/2019   Osteoporosis 10/12/2018   Chronic venous insufficiency 10/07/2018   Lymphedema 10/07/2018   SLE glomerulonephritis syndrome, 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HCC) 03/03/2018   Syncope and collapse 12/29/2017   Nausea & vomiting 05/07/2017   Acute renal failure with acute tubular necrosis superimposed on stage 3b chronic kidney disease (HCC) 04/27/2017   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) 10/29/2016   Mixed hyperlipidemia 01/15/2016   Primary hypertension 01/15/2016   Obesity (BMI 30.0-34.9) 01/15/2016   Long term  current use of anticoagulant 10/04/2015   Systemic lupus erythematosus (HCC) 05/30/2015   COPD, moderate (HCC) 06/27/2014   Shortness of breath 06/27/2014   History of pulmonary embolism 12/11/2013   Nodule of right lung 12/11/2013   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   Cystic disease of liver 08/21/2013      Current Outpatient Medications:    albuterol (VENTOLIN HFA) 108 (90 Base) MCG/ACT inhaler, Inhale 2 puffs into the lungs every 6 (six) hours as needed for wheezing or shortness of breath., Disp: 18 g, Rfl: 2   APIXABAN (ELIQUIS) VTE STARTER PACK (10MG  AND 5MG ), Take as directed on package: start with two-5mg  tablets twice daily for 7 days. On day 8, switch to one-5mg  tablet twice daily., Disp: 74 each, Rfl: 0   ARIPiprazole (ABILIFY) 2 MG tablet, Take 2 mg by mouth daily., Disp: , Rfl:    cholecalciferol (VITAMIN D3) 25 MCG (1000 UNIT) tablet, Take 1,000 Units by mouth daily., Disp: , Rfl:    clonazePAM (KLONOPIN) 0.5 MG tablet, Take 0.5 mg by mouth daily., Disp: , Rfl:    clopidogrel (PLAVIX) 75 MG tablet, TAKE 1 TABLET(75 MG) BY MOUTH DAILY, Disp: 30 tablet, Rfl: 0   cromolyn (OPTICROM) 4 % ophthalmic solution, Place 1 drop into both eyes 4 (four) times daily as needed (for itching/allergies)., Disp: 10 mL, Rfl:  2   furosemide (LASIX) 20 MG tablet, Take 0.5 tablets (10 mg total) by mouth daily for 14 days., Disp: 30 tablet, Rfl: 0   gabapentin (NEURONTIN) 100 MG capsule, Take 2 capsules (200 mg total) by mouth 3 (three) times daily., Disp: 180 capsule, Rfl: 2   hydrOXYzine (ATARAX) 25 MG tablet, Take 1 tablet (25 mg total) by mouth 2 (two) times daily as needed for anxiety, itching or nausea., Disp: , Rfl:    levocetirizine (XYZAL) 5 MG tablet, Take 1 tablet (5 mg total) by mouth every evening., Disp: 30 tablet, Rfl: 5   lidocaine (LIDODERM) 5 %, Place 2 patches onto the skin daily. Remove & Discard patch within 12 hours or as directed by MD, Disp: 30 patch, Rfl: 0    loratadine (CLARITIN) 10 MG tablet, Take 1 tablet (10 mg total) by mouth daily., Disp: 30 tablet, Rfl: 11   methocarbamol (ROBAXIN) 500 MG tablet, Take 1 tablet (500 mg total) by mouth every 8 (eight) hours as needed for muscle spasms., Disp: 60 tablet, Rfl: 1   metoprolol succinate (TOPROL-XL) 25 MG 24 hr tablet, Take 1 tablet (25 mg total) by mouth daily., Disp: 90 tablet, Rfl: 3   midodrine (PROAMATINE) 10 MG tablet, Take 1 tablet (10 mg total) by mouth 3 (three) times daily with meals. Skip the dose if SBP >140 mmHg, Disp: 270 tablet, Rfl: 3   montelukast (SINGULAIR) 10 MG tablet, Take 1 tablet (10 mg total) by mouth at bedtime., Disp: 90 tablet, Rfl: 3   Multiple Vitamins-Minerals (MULTIVITAMIN WITH MINERALS) tablet, Take 1 tablet by mouth daily., Disp: , Rfl:    mycophenolate (CELLCEPT) 500 MG tablet, Take 2 tablets (1,000 mg total) by mouth 2 (two) times daily. HOLD until neurosurgery is ok to restart this (waiting for incision to heal) (Patient taking differently: Take 500 mg by mouth 2 (two) times daily. HOLD until neurosurgery is ok to restart this (waiting for incision to heal)), Disp: , Rfl:    nystatin (MYCOSTATIN/NYSTOP) powder, Apply 1 Application topically 3 (three) times daily., Disp: 15 g, Rfl: 0   nystatin cream (MYCOSTATIN), Apply 1 Application topically 2 (two) times daily., Disp: 30 g, Rfl: 0   ofloxacin (OCUFLOX) 0.3 % ophthalmic solution, Instill 1 to 2 drops in affected eye every 4 hours for the first 2 days, then instill 1 to 2 drops 4 times daily for an additional 5 days., Disp: 5 mL, Rfl: 0   oxyCODONE (OXY IR/ROXICODONE) 5 MG immediate release tablet, Take 1-2 tablets (5-10 mg total) by mouth every 6 (six) hours as needed for moderate pain (pain score 4-6) or severe pain (pain score 7-10)., Disp: 30 tablet, Rfl: 0   rosuvastatin (CRESTOR) 20 MG tablet, Take 1 tablet (20 mg total) by mouth daily., Disp: 90 tablet, Rfl: 0   senna (SENOKOT) 8.6 MG TABS tablet, Take 2 tablets  (17.2 mg total) by mouth at bedtime. Stop if loose stool, Disp: 60 tablet, Rfl: 0   sertraline (ZOLOFT) 100 MG tablet, Take 1.5 tablets (150 mg total) by mouth daily., Disp: 90 tablet, Rfl: 0   tadalafil (CIALIS) 20 MG tablet, Take 1 tablet (20 mg total) by mouth daily as needed for erectile dysfunction. Avoid taking it if low blood pressure, Disp: , Rfl:    tamsulosin (FLOMAX) 0.4 MG CAPS capsule, Take 1 capsule (0.4 mg total) by mouth daily after supper., Disp: 30 capsule, Rfl: 0   Allergies  Allergen Reactions   Enalapril Other (See Comments)  Unknown reaction   Vicodin [Hydrocodone-Acetaminophen] Hives and Rash    Severe headaches (also) NAME BRAND ONLY PER PT CAN TAKE GENERIC     Social History   Tobacco Use   Smoking status: Never   Smokeless tobacco: Never  Vaping Use   Vaping status: Never Used  Substance Use Topics   Alcohol use: No   Drug use: No    Comment: PT DENIES      Chart Review Today: I personally reviewed active problem list, medication list, allergies, family history, social history, health maintenance, notes from last encounter, lab results, imaging with the patient/caregiver today.   Review of Systems  Constitutional: Negative.   HENT: Negative.    Eyes: Negative.   Respiratory: Negative.    Cardiovascular: Negative.   Gastrointestinal: Negative.   Endocrine: Negative.   Genitourinary: Negative.   Musculoskeletal: Negative.   Skin: Negative.   Allergic/Immunologic: Negative.   Neurological: Negative.   Hematological: Negative.   Psychiatric/Behavioral: Negative.    All other systems reviewed and are negative.      Objective:   Vitals:   01/25/24 1035  BP: 138/82  Pulse: 92  Resp: 16  SpO2: 95%  Weight: 227 lb (103 kg)  Height: 6\' 2"  (1.88 m)    Body mass index is 29.15 kg/m.  Physical Exam Vitals and nursing note reviewed.  Constitutional:      General: He is not in acute distress.    Appearance: He is well-developed. He is  obese. He is ill-appearing. He is not toxic-appearing or diaphoretic.  HENT:     Head: Normocephalic and atraumatic.     Nose: Nose normal.  Eyes:     General:        Right eye: No discharge.        Left eye: No discharge.     Conjunctiva/sclera: Conjunctivae normal.  Neck:     Trachea: No tracheal deviation.  Cardiovascular:     Rate and Rhythm: Normal rate and regular rhythm.     Pulses: Normal pulses.     Heart sounds: Normal heart sounds.  Pulmonary:     Effort: Pulmonary effort is normal. No respiratory distress.     Breath sounds: Normal breath sounds. No stridor.  Musculoskeletal:     Right lower leg: 1+ Edema present.     Left lower leg: 2+ Edema present.  Skin:    General: Skin is warm.     Findings: Erythema and rash present.  Neurological:     Mental Status: He is alert.     Motor: No abnormal muscle tone.     Coordination: Coordination normal.     Gait: Gait abnormal.  Psychiatric:        Behavior: Behavior normal.              Results for orders placed or performed during the hospital encounter of 01/20/24  Basic metabolic panel   Collection Time: 01/20/24 12:13 PM  Result Value Ref Range   Sodium 138 135 - 145 mmol/L   Potassium 4.0 3.5 - 5.1 mmol/L   Chloride 106 98 - 111 mmol/L   CO2 21 (L) 22 - 32 mmol/L   Glucose, Bld 111 (H) 70 - 99 mg/dL   BUN 15 8 - 23 mg/dL   Creatinine, Ser 1.61 (H) 0.61 - 1.24 mg/dL   Calcium 9.2 8.9 - 09.6 mg/dL   GFR, Estimated 59 (L) >60 mL/min   Anion gap 11 5 - 15  Assessment & Plan:    1. 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  Dispense: 60 tablet;  Refill: 1  2. Pain in both lower extremities Some is related to low back pain and likely neurogenic claudication sx/pain, but LLE with swelling and redness is much worse than his normal - can use pain meds sparingly - oxyCODONE-acetaminophen (PERCOCET/ROXICET) 5-325 MG tablet; Take 1-2 tablets by mouth every 8 (eight) hours as needed for up to 5 days for severe pain (pain score 7-10).  Dispense: 20 tablet; Refill: 0  3. Rash and nonspecific skin eruption All over shoulder and bilateral upper extremitis, some vesicular/clusters and some scattered erythematous maculopapular rash, both blanching, not likely shingles due to bilateral distribution Long steroid taper discussed while pt gets dermatology f/up appt -  May be related to SLE and decrease in multiple meds over the past 2 months? May need bx to dx - predniSONE (DELTASONE) 10 MG tablet; Take 40 mg (4 tabs) po qam x 3 d, then 30 mg poqamx3d, 20 mg poqam x 3d, 10 mg poqam x 3d then 5 mg (1/2 tab) poqam x 4 d  Dispense: 32 tablet; Refill: 0  4. Ankle wound, left, initial encounter From severe edema, then peeling of skin he has scab/wound to left inner lower leg and severe pain which seems out of proportion for the edema and other skin changes most resembles chronic stasis dermatitis but pt reports its acute - he may also have some superficial phlebitis?  Hx of varicositis, lymphedema  Cover with doxy - will try to ask vascular to get him in for eval and wound care - doxycycline (VIBRA-TABS) 100 MG tablet; Take 1 tablet (100 mg total) by mouth 2 (two) times daily for 7 days.  Dispense: 14 tablet; Refill: 0  5. Dermatitis Both left LE which looks most like chronic venous stasis dermatitis  But also bilateral shoulders, UE and truck with rash  6. Acute on chronic low back pain Worse pain than baseline with multiple acute issues - discussed risk and limitation with acute pain rx  - oxyCODONE-acetaminophen (PERCOCET/ROXICET) 5-325 MG tablet; Take  1-2 tablets by mouth every 8 (eight) hours as needed for up to 5 days for severe pain (pain score 7-10).  Dispense: 20 tablet; Refill: 0  7. Claudication of left lower extremity (HCC) Not sure if this is infection/edema on top of baseline LE neurogenic claudication? Pt was encouraged to go to the ED if pain worsned with any pallor to left LE/foot  8. SLE glomerulonephritis syndrome, 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HCC) Med changes in the past couple months Off steroids, decrease in his cellcept Recent visit with nephrology who is managing most of his meds  9. Anticoagulant disorder (HCC) Pt with DVT/PE now x3 at least, will need to f/up with hematology - apixaban (ELIQUIS) 5 MG TABS tablet; Take 1 tablet (5 mg total) by mouth 2 (two) times daily.  Dispense: 60 tablet; Refill: 1  10. Long term current use of anticoagulant Recurrent DVT on eliquis 2.5, last week 1/27 did starter pack 10 mg BID x 7 d then 5 mg BID - will keep him at  5 mg until seen by hematolory  11. Immunocompromised state due to drug therapy [facility]) Will cover with abx for wound/scab and seek f/up care with vascular specialists   At end of visit pt was unable to get himself up from chair independently and he needed assistance.  He left walking in his vehicle and was here alone.  I walked him out of clinic and to his car.  Very short, shuffled, unsteady gait.  I ensured that he did not fall and helped him into vehicle before leaving pt. I encouraged him to bring his walker into clinic and appts.  He does not have a grab belt to help him with assists/prevent falls He is working with [facility] PT right now.     Danelle Berry, PA-C 01/25/24 10:49 AM

## 2024-01-26 DIAGNOSIS — F332 Major depressive disorder, recurrent severe without psychotic features: Secondary | ICD-10-CM | POA: Diagnosis not present

## 2024-01-27 ENCOUNTER — Other Ambulatory Visit: Payer: Self-pay

## 2024-01-27 DIAGNOSIS — S22078D Other fracture of T9-T10 vertebra, subsequent encounter for fracture with routine healing: Secondary | ICD-10-CM

## 2024-01-28 ENCOUNTER — Other Ambulatory Visit (INDEPENDENT_AMBULATORY_CARE_PROVIDER_SITE_OTHER): Payer: Self-pay | Admitting: Nurse Practitioner

## 2024-01-28 ENCOUNTER — Ambulatory Visit: Payer: Medicaid Other | Admitting: Physician Assistant

## 2024-01-28 ENCOUNTER — Ambulatory Visit (INDEPENDENT_AMBULATORY_CARE_PROVIDER_SITE_OTHER): Payer: Medicaid Other | Admitting: Neurosurgery

## 2024-01-28 ENCOUNTER — Encounter: Payer: Self-pay | Admitting: Neurosurgery

## 2024-01-28 ENCOUNTER — Ambulatory Visit
Admission: RE | Admit: 2024-01-28 | Discharge: 2024-01-28 | Disposition: A | Discharge status: Home / Self Care | Payer: Medicaid Other | Attending: Neurosurgery | Admitting: Neurosurgery

## 2024-01-28 ENCOUNTER — Ambulatory Visit: Payer: Self-pay

## 2024-01-28 ENCOUNTER — Ambulatory Visit: Payer: Medicaid Other | Admitting: Family Medicine

## 2024-01-28 ENCOUNTER — Ambulatory Visit
Admission: RE | Admit: 2024-01-28 | Discharge: 2024-01-28 | Disposition: A | Discharge status: Home / Self Care | Payer: Medicaid Other | Source: Ambulatory Visit | Attending: Neurosurgery | Admitting: Neurosurgery

## 2024-01-28 VITALS — BP 178/104 | Temp 97.7°F | Ht 74.0 in | Wt 227.0 lb

## 2024-01-28 DIAGNOSIS — Z4789 Encounter for other orthopedic aftercare: Secondary | ICD-10-CM | POA: Diagnosis not present

## 2024-01-28 DIAGNOSIS — S22078D Other fracture of T9-T10 vertebra, subsequent encounter for fracture with routine healing: Secondary | ICD-10-CM

## 2024-01-28 DIAGNOSIS — L7682 Other postprocedural complications of skin and subcutaneous tissue: Secondary | ICD-10-CM

## 2024-01-28 DIAGNOSIS — Z09 Encounter for follow-up examination after completed treatment for conditions other than malignant neoplasm: Secondary | ICD-10-CM

## 2024-01-28 DIAGNOSIS — I739 Peripheral vascular disease, unspecified: Secondary | ICD-10-CM

## 2024-01-28 NOTE — Progress Notes (Signed)
 REFERRING PHYSICIAN:  [name], Pa-c [address] [place],  [name] [address]  DOS: 11/06/23, T7-12 Posterior fusion with Dr. Clois  HISTORY OF PRESENT ILLNESS:  01/28/24 Jonathon Snow presents today for 3 month follow up of thoracic fusion.  At his last visit he was having trouble with wound healing and was referred to the wound clinic for this.  Today he continues to have stiffness and pain in his back.  He was seen by wound care and his incisions are now healed however he continues to have stinging pain around his incisions that is constant in nature but waxes and wanes in regards to severity.  He continues to work with home therapy. He was diagnosed with another DVT on 01/18/2024 and is currently undergoing anticoagulation for this. Today he is having significantly elevated blood pressure however he states that his losartan  was stopped in the hospital with no guidance as to when he should resume this.  He is checking his blood pressures at home and is having episodes of both high and low blood pressure. In addition to these symptoms, he has continued to have bilateral shoulder pain that is worse with range of motion exercises.  12/22/23 Jonathon Snow is s/p above surgery.  He is doing reasonably well.  He has continued back pain.   PHYSICAL EXAMINATION:  General: Patient is ill appearing.   NEUROLOGICAL:   Awake, alert, oriented to person, place, and time.   Moves all extremities well.  Walking with walker.  He continues to have poor wound healing on multiple of his stab incisions from his percutaneous fixation.   ROS (Neurologic):  Negative except as noted above  Images: 01/28/24 Thoracic xrays without signs of hardware malfunction. Formal radiology read pending  12/22/23 FINDINGS: Pedicle screws extend from the mid to the lower thoracic spine. Exact levels difficult to precisely determine, but top level is suspected to be T7, lowest level is suspected be  T12. Pedicle screws appear well positioned and seated. Vertebral body cement is well centered at the T7, T8 T11 and T12 levels. Interconnecting rods between the pedicle screws intact.   No acute fracture.  No malalignment.   IMPRESSION: 1. Previous posterior thoracic spine fusion well as evidence vertebroplasty. Orthopedic hardware is well seated without evidence complication.     Electronically Signed   By: Alm Parkins M.D.   On: 01/01/2024 09:13  ASSESSMENT/PLAN:  Jonathon Snow is s/p above surgery.  His x-rays are reassuring for routine healing of his recent fusion and his incisions appear to be well-healed.  I discussed his ongoing dysesthetic pain with Dr. Clois who recommended referral to dermatology.  I have placed a referral for this. I have sent a multidisciplinary message to his primary care provider, nephrologist, and cardiology in regards to his blood pressure and further guidance regarding resuming his losartan  as well as his ongoing shoulder pain which seem to worsen since changes in his medications for lupus.  We briefly discussed the role for referral to outpatient physical therapy versus referral to orthopedics versus referral to rheumatology however I will wait to hear back from his other specialist prior to moving forward with this.  I will reach out to the patient via MyChart once I have heard back from his other providers. In regards to his significant hypertension in the office today, I will reach out to his other providers for guidance.  He is not particularly symptomatic and denies any headaches, chest pain, or shortness of  breath at present.  We discussed concerning symptoms that would prompt him to present to the ER. In regards to his thoracic fusion, I recommended follow-up with Dr. Clois in 3 months with thoracic x-rays prior.  He was encouraged to call our office in the interim with any questions or concerns.  He expressed understanding and was in  agreement with this plan.  Edsel Goods PA-C Department of neurosurgery

## 2024-01-28 NOTE — Telephone Encounter (Signed)
  Chief Complaint: Elevated BPs Symptoms: perhaps a little lightheaded Frequency: ongoing Pertinent Negatives: Patient denies missing any meds Disposition: [] ED /[] Urgent Care (no appt availability in office) / [x] Appointment(In office/virtual)/ []  [facility]/ [] Home Care/ [] Refused Recommended Disposition /[] [facility]/ []  Follow-up with PCP Additional Notes: Pt was seen at a medical office this morning and Bps there were elevated . Pt retook bp's at home and these were elevated as well. Pt states he may be a little light headed, but states it may be from not sleeping well. Appt for tomorrow morning. Pt will continue to monitor bp's this evening and s/s.    Reason for Disposition  Systolic BP  >= 180 OR Diastolic >= 110  Answer Assessment - Initial Assessment Questions 1. BLOOD PRESSURE: What is the blood pressure? Did you take at least two measurements 5 minutes apart?     One at medical office today was 178/104 2. ONSET: When did you take your blood pressure?  One at home automatic 180/99 3. HOW: How did you take your blood pressure? (e.g., automatic home BP monitor, visiting nurse)     Home  4. HISTORY: Do you have a history of high blood pressure?     yes 5. MEDICINES: Are you taking any medicines for blood pressure? Have you missed any doses recently?    no 6. OTHER SYMPTOMS: Do you have any symptoms? (e.g., blurred vision, chest pain, difficulty breathing, headache, weakness)     A little lightheaded  Protocols used: Blood Pressure - High-A-AH

## 2024-01-29 ENCOUNTER — Ambulatory Visit: Payer: Medicaid Other | Admitting: Family Medicine

## 2024-01-29 ENCOUNTER — Encounter: Payer: Self-pay | Admitting: Family Medicine

## 2024-01-29 VITALS — BP 162/104 | HR 75 | Resp 16 | Ht 74.0 in | Wt 227.0 lb

## 2024-01-29 DIAGNOSIS — I82402 Acute embolism and thrombosis of unspecified deep veins of left lower extremity: Secondary | ICD-10-CM

## 2024-01-29 DIAGNOSIS — M79605 Pain in left leg: Secondary | ICD-10-CM

## 2024-01-29 DIAGNOSIS — R21 Rash and other nonspecific skin eruption: Secondary | ICD-10-CM | POA: Diagnosis not present

## 2024-01-29 DIAGNOSIS — I10 Essential (primary) hypertension: Secondary | ICD-10-CM

## 2024-01-29 DIAGNOSIS — Z7901 Long term (current) use of anticoagulants: Secondary | ICD-10-CM

## 2024-01-29 DIAGNOSIS — M3214 Glomerular disease in systemic lupus erythematosus: Secondary | ICD-10-CM | POA: Diagnosis not present

## 2024-01-29 DIAGNOSIS — S91002A Unspecified open wound, left ankle, initial encounter: Secondary | ICD-10-CM

## 2024-01-29 DIAGNOSIS — S91002D Unspecified open wound, left ankle, subsequent encounter: Secondary | ICD-10-CM

## 2024-01-29 DIAGNOSIS — M79604 Pain in right leg: Secondary | ICD-10-CM

## 2024-01-29 MED ORDER — HYDRALAZINE HCL 25 MG PO TABS: 25.0000 mg | ORAL_TABLET | Freq: Two times a day (BID) | ORAL | 0 refills | Status: DC

## 2024-01-29 MED ORDER — LOSARTAN POTASSIUM 25 MG PO TABS: 25.0000 mg | ORAL_TABLET | Freq: Every day | ORAL | 1 refills | Status: DC

## 2024-01-29 MED ORDER — HYDROXYZINE HCL 10 MG PO TABS: 10.0000 mg | ORAL_TABLET | Freq: Three times a day (TID) | ORAL | 0 refills | Status: DC | PRN

## 2024-01-29 NOTE — Progress Notes (Signed)
 Patient ID: Jonathon Snow, male    DOB: 1961-10-13, 63 y.o.   MRN: [id number]  PCP: [name], PA-C  Chief Complaint  Patient presents with   Hypertension    Subjective:   Jonathon Snow is a 63 y.o. male, presents to clinic with CC of the following:  HPI  Here for BP recheck BP reading in office recently was near goal, however he went to neurosurgery f/up appt and BP was very high. At home he reported both high and low readings BP Readings from Last 15 Encounters:  01/29/24 (!) 168/104  01/28/24 (!) 178/104  01/25/24 138/82  01/20/24 (!) 150/100  01/18/24 (!) 142/86  01/07/24 136/70  12/22/23 138/84  12/21/23 118/60  12/14/23 132/84  11/25/23 130/69  11/23/23 124/80  11/13/23 121/63  10/28/23 126/84  09/29/23 137/82  09/21/23 135/80  Repeated after sitting 5 min, not much improvement 162/104 He states over the past month Bp has been 130/80 or high, no low readings recently, he had lows only after MI and hospitalization  On midodrine - states he's only taken it once or twice, not taking currently or recently midodrine  (PROAMATINE ) 10 MG tablet 12/21/2023 270 tablet 3 ordered        10 mg, 3 times daily with mealsAuthorized By: Loistine Sober, NP  Summary: Take 1 tablet (10 mg total) by mouth 3 (three) times daily with meals. Skip the dose if SBP >140 mmHg, Starting Mon 12/21/2023, Normal      Most recent nephrology note: (who was prescribing and managing losartan  prior to November surgery) Impression/Recommendations  Jonathon Snow is a 63 y.o. male with with past history of pulmonary embolus, history of IVC filter placement, right ankle fracture status post ORIF, and herniated disc status post L5-S1 fusion, who returns today in followup of lupus nephritis, proteinuria, CKD stage III, hypertension.  Problem List: 1. Lupus Nephritis, underwent induction with cellcept  x 1.5 years. Relapse 01/2017, restarted on cellcept . 2. Proteinuria. 3. Hypertension. 4.  Positive ANA with positive double-stranded DNA, anti-chromatin antibodies, SSA antibody, and RNP antibody. 5. Systemic lupus erythematosis. 6. Drug induced rash secondary to cytoxan. 7. History of anemia of CKD. 8. 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  9. History of herpes zoster infection due to immunosuppression. 10. Right axillary and left groin abscess 04/2017. 11. Syncope 10/18/17. 12. Episode of unresponsiveness/respiratory failure/acute renal failure 02/2018 13. L5-S1 spinal fusion 06/17/21.  14. Secondary hyperparathyroidism. 15. T7-12 posterior spinal fusion 11/06/2023.  Plan: The patient's renal function did fluctuate during his recent hospitalization. Most recent creatinine was 1.4 with EGFR up to 56. CellCept  was reduced to 500 mg twice a day and he was taken off of prednisone  to allow for better wound healing. He was also taken off of losartan . We plan to follow-up renal function, urine protein creatinine ratio, C3, C4, double-stranded DNA titer. Maintain the patient on current dosage of CellCept  500 mg twice a day. We will maintain the patient off of prednisone  for now.  Return in about 3 months (around 04/04/2024).  [name], MD   Still having sig leg pain, LLE is a little better, scab/wound improving, redness and swelling improving, however pain with walking still persists - he has ABI with vascular next week  Rash- slight improvement after a few days of steroids, no improvement to itching - unclear etiology - could be related to lupus? He has derm appt next week.    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) 01/20/2024   Anxiety 12/14/2023   Overweight (BMI 25.0-29.9) 11/23/2023   Chronic heart failure with preserved ejection fraction (HFpEF) (HCC) 11/23/2023   BPH (benign prostatic hyperplasia) 11/23/2023   Unstable angina (HCC) 11/10/2023   Closed  T10 spinal fracture (HCC) 11/06/2023   Closed tricolumnar fracture of thoracic vertebra (HCC) 11/06/2023   Thoracic spine instability 11/06/2023   Chronic bilateral low back pain with left-sided sciatica 07/07/2023   Neuropathy 07/07/2023   Hx of colonic polyps 07/01/2023   Adenomatous polyp of colon 07/01/2023   Erectile disorder 01/22/2023   Coronary artery disease of native artery of native heart with stable angina pectoris (HCC) 09/09/2022   Varicose 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 01/03/2022   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) 09/18/2021   Prediabetes 11/08/2020   Stage 3a chronic kidney disease (HCC) 11/08/2020   Seasonal allergies 11/08/2020   Rhinosinusitis 11/08/2020   Anticoagulant disorder (HCC) 11/07/2020   Senile purpura (HCC) 11/07/2020   MDD (major depressive disorder), recurrent episode, mild (HCC) 09/12/2019   Other spondylosis with radiculopathy, lumbar region 02/16/2019   Osteoporosis 10/12/2018   Chronic venous insufficiency 10/07/2018   Lymphedema 10/07/2018   SLE glomerulonephritis syndrome, 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HCC) 03/03/2018   Acute renal failure with acute tubular necrosis superimposed on stage 3b chronic kidney disease (HCC) 04/27/2017   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) 10/29/2016   Mixed hyperlipidemia 01/15/2016   Primary hypertension 01/15/2016   Obesity (BMI 30.0-34.9) 01/15/2016   Long term current use of anticoagulant 10/04/2015   Systemic lupus erythematosus (HCC) 05/30/2015   COPD, moderate (HCC) 06/27/2014   Shortness of breath 06/27/2014   History of pulmonary embolism 12/11/2013   Nodule of right lung 12/11/2013   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   Cystic disease of liver 08/21/2013      Current Outpatient Medications:    albuterol  (VENTOLIN  HFA) 108 (90 Base) MCG/ACT inhaler, Inhale 2 puffs into the lungs every 6 (six) hours as needed for  wheezing or shortness of breath., Disp: 18 g, Rfl: 2   apixaban  (ELIQUIS ) 5 MG TABS tablet, Take 1 tablet (5 mg total) by mouth 2 (two) times daily., Disp: 60 tablet, Rfl: 1   APIXABAN  (ELIQUIS ) VTE STARTER PACK (10MG  AND 5MG ), Take as directed on package: start with two-5mg  tablets twice daily for 7 days. On day 8, switch to one-5mg  tablet twice daily., Disp: 74 each, Rfl: 0   ARIPiprazole  (ABILIFY ) 2 MG tablet, Take 2 mg by mouth daily., Disp: , Rfl:    cholecalciferol  (VITAMIN D3) 25 MCG (1000 UNIT) tablet, Take 1,000 Units by mouth daily., Disp: , Rfl:    clonazePAM  (KLONOPIN ) 0.5 MG tablet, Take 0.5 mg by mouth daily., Disp: , Rfl:    clopidogrel  (PLAVIX ) 75 MG tablet, TAKE 1 TABLET(75 MG) BY MOUTH DAILY, Disp: 30 tablet, Rfl: 0   cromolyn  (OPTICROM ) 4 % ophthalmic solution, Place 1 drop into both eyes 4 (four) times daily as needed (for itching/allergies)., Disp: 10 mL, Rfl: 2   doxycycline  (VIBRA -TABS) 100 MG tablet, Take 1 tablet (100 mg total) by mouth 2 (two) times daily for 7 days., Disp: 14 tablet, Rfl: 0   furosemide  (LASIX ) 20 MG tablet, Take 0.5 tablets (10 mg total) by mouth daily for 14 days., Disp: 30 tablet, Rfl: 0   gabapentin  (NEURONTIN ) 100 MG capsule, Take 2 capsules (  200 mg total) by mouth 3 (three) times daily., Disp: 180 capsule, Rfl: 2   hydrOXYzine  (ATARAX ) 25 MG tablet, Take 1 tablet (25 mg total) by mouth 2 (two) times daily as needed for anxiety, itching or nausea., Disp: , Rfl:    levocetirizine (XYZAL ) 5 MG tablet, Take 1 tablet (5 mg total) by mouth every evening., Disp: 30 tablet, Rfl: 5   methocarbamol  (ROBAXIN ) 500 MG tablet, Take 1 tablet (500 mg total) by mouth every 8 (eight) hours as needed for muscle spasms., Disp: 60 tablet, Rfl: 1   metoprolol  succinate (TOPROL -XL) 25 MG 24 hr tablet, Take 1 tablet (25 mg total) by mouth daily., Disp: 90 tablet, Rfl: 3   midodrine  (PROAMATINE ) 10 MG tablet, Take 1 tablet (10 mg total) by mouth 3 (three) times daily with  meals. Skip the dose if SBP >140 mmHg, Disp: 270 tablet, Rfl: 3   montelukast  (SINGULAIR ) 10 MG tablet, Take 1 tablet (10 mg total) by mouth at bedtime., Disp: 90 tablet, Rfl: 3   Multiple Vitamins-Minerals (MULTIVITAMIN WITH MINERALS) tablet, Take 1 tablet by mouth daily., Disp: , Rfl:    mycophenolate  (CELLCEPT ) 500 MG tablet, Take 2 tablets (1,000 mg total) by mouth 2 (two) times daily. HOLD until neurosurgery is ok to restart this (waiting for incision to heal) (Patient taking differently: Take 500 mg by mouth 2 (two) times daily. HOLD until neurosurgery is ok to restart this (waiting for incision to heal)), Disp: , Rfl:    nystatin  (MYCOSTATIN /NYSTOP ) powder, Apply 1 Application topically 3 (three) times daily., Disp: 15 g, Rfl: 0   nystatin  cream (MYCOSTATIN ), Apply 1 Application topically 2 (two) times daily., Disp: 30 g, Rfl: 0   ofloxacin  (OCUFLOX ) 0.3 % ophthalmic solution, Instill 1 to 2 drops in affected eye every 4 hours for the first 2 days, then instill 1 to 2 drops 4 times daily for an additional 5 days., Disp: 5 mL, Rfl: 0   oxyCODONE -acetaminophen  (PERCOCET/ROXICET) 5-325 MG tablet, Take 1-2 tablets by mouth every 8 (eight) hours as needed for up to 5 days for severe pain (pain score 7-10)., Disp: 20 tablet, Rfl: 0   predniSONE  (DELTASONE ) 10 MG tablet, Take 40 mg (4 tabs) po qam x 3 d, then 30 mg poqamx3d, 20 mg poqam x 3d, 10 mg poqam x 3d then 5 mg (1/2 tab) poqam x 4 d, Disp: 32 tablet, Rfl: 0   rosuvastatin  (CRESTOR ) 20 MG tablet, Take 1 tablet (20 mg total) by mouth daily., Disp: 90 tablet, Rfl: 0   senna (SENOKOT) 8.6 MG TABS tablet, Take 2 tablets (17.2 mg total) by mouth at bedtime. Stop if loose stool, Disp: 60 tablet, Rfl: 0   sertraline  (ZOLOFT ) 100 MG tablet, Take 1.5 tablets (150 mg total) by mouth daily., Disp: 90 tablet, Rfl: 0   tadalafil  (CIALIS ) 20 MG tablet, Take 1 tablet (20 mg total) by mouth daily as needed for erectile dysfunction. Avoid taking it if low blood  pressure, Disp: , Rfl:    tamsulosin  (FLOMAX ) 0.4 MG CAPS capsule, Take 1 capsule (0.4 mg total) by mouth daily after supper., Disp: 30 capsule, Rfl: 0   Allergies  Allergen Reactions   Enalapril Other (See Comments)    Unknown reaction   Vicodin [Hydrocodone -Acetaminophen ] Hives and Rash    Severe headaches (also) NAME BRAND ONLY PER PT CAN TAKE GENERIC     Social History   Tobacco Use   Smoking status: Never   Smokeless tobacco: Never  Vaping Use  Vaping status: Never Used  Substance Use Topics   Alcohol use: No   Drug use: No    Comment: PT DENIES      Chart Review Today: I personally reviewed active problem list, medication list, allergies, family history, social history, health maintenance, notes from last encounter, lab results, imaging with the patient/caregiver today.   Review of Systems  Constitutional: Negative.   HENT: Negative.    Eyes: Negative.   Respiratory: Negative.    Cardiovascular: Negative.   Gastrointestinal: Negative.   Endocrine: Negative.   Genitourinary: Negative.   Musculoskeletal: Negative.   Skin: Negative.   Allergic/Immunologic: Negative.   Neurological: Negative.   Hematological: Negative.   Psychiatric/Behavioral: Negative.    All other systems reviewed and are negative.      Objective:   Vitals:   01/29/24 1058 01/29/24 1108  BP: (!) 168/104 (!) 162/104  Pulse: 75   Resp: 16   SpO2: 99%   Weight: 227 lb (103 kg)   Height: 6' 2 (1.88 m)     Body mass index is 29.15 kg/m.  Physical Exam Vitals and nursing note reviewed.  Constitutional:      General: He is not in acute distress.    Appearance: Normal appearance. He is well-developed. He is not toxic-appearing or diaphoretic.     Comments: Appears very uncomfortable  HENT:     Head: Normocephalic and atraumatic.     Nose: Nose normal.  Eyes:     General:        Right eye: No discharge.        Left eye: No discharge.     Conjunctiva/sclera: Conjunctivae  normal.  Neck:     Trachea: No tracheal deviation.  Cardiovascular:     Rate and Rhythm: Normal rate and regular rhythm.  Pulmonary:     Effort: Pulmonary effort is normal. No respiratory distress.     Breath sounds: No stridor.  Musculoskeletal:     Left lower leg: Edema (some improvement in pretibial and pedal edema comparted to Monday (4 d ago)) present.  Skin:    General: Skin is warm and dry.     Findings: No rash.  Neurological:     Mental Status: He is alert.     Motor: No abnormal muscle tone.     Coordination: Coordination normal.     Gait: Gait abnormal (has rolling walker today).  Psychiatric:        Behavior: Behavior normal.      Results for orders placed or performed during the hospital encounter of 01/20/24  Basic metabolic panel   Collection Time: 01/20/24 12:13 PM  Result Value Ref Range   Sodium 138 135 - 145 mmol/L   Potassium 4.0 3.5 - 5.1 mmol/L   Chloride 106 98 - 111 mmol/L   CO2 21 (L) 22 - 32 mmol/L   Glucose, Bld 111 (H) 70 - 99 mg/dL   BUN 15 8 - 23 mg/dL   Creatinine, Ser 8.64 (H) 0.61 - 1.24 mg/dL   Calcium  9.2 8.9 - 10.3 mg/dL   GFR, Estimated 59 (L) >60 mL/min   Anion gap 11 5 - 15   *Note: Due to a large number of results and/or encounters for the requested time period, some results have not been displayed. A complete set of results can be found in Results Review.       Assessment & Plan:   1. Hypertension, unspecified type (Primary) Elevated here today in the office, multiple home readings  and at neurosurgery follow-up appointment After his hospitalization in November he was taken off his blood pressure medications We will restart losartan , and in the short-term with severe pain and while waiting to get losartan  back in his system he can use hydralazine  12.5 to 25 mg twice daily to keep blood pressure better controlled He has hx of low BP and syncope -this seems to be for short time after his MI and he reports no low blood pressure  readings for the last several weeks and 1 to 2 months that he has not required midodrine . He also previously was on amlodipine  but has not taken for most of 2024 We will do close blood pressure recheck follow-up in about 2 weeks and watch his renal function Previously Dr. Lazarus was prescribing and managing losartan  45 mg daily for lupus nephritis and CKD His last labs show eGFR is 59  Reviewed with the patient at length today restarting losartan  which is unlikely to bring his blood pressure down significantly from 160-180/100, also start hydralazine  25 mg twice daily and if that decreases blood pressure too low where he feels lightheaded he can reduce to half a tablet twice a day. As losartan  works or with better pain control he may need to decrease hydralazine  or stop hydralazine  We discussed other possible medicines for very high blood pressure we prefer hydralazine  over clonidine (after discussion with patient) He could possibly restart amlodipine  -but he was previously having some side effects from that particularly lower extremity edema and right now he is already having to his leg pain.    - hydrALAZINE  (APRESOLINE ) 25 MG tablet; Take 1 tablet (25 mg total) by mouth in the morning and at bedtime.  Dispense: 60 tablet; Refill: 0 - losartan  (COZAAR ) 25 MG tablet; Take 1 tablet (25 mg total) by mouth daily.  Dispense: 90 tablet; Refill: 1  2. SLE glomerulonephritis syndrome, 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HCC) Reestablish with rheumatology -patient was on prednisone  for many years and CellCept , in November with hospitalization prednisone  was discontinued and CellCept  dose was reduced - Ambulatory referral to Rheumatology  3. 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 Pain in both lower extremities He did follow-up with neurosurgery and he does have follow-up with vascular specialist with  ABI Discussed with neurosurgery PA possible outpatient physical therapy to continue to help his back pain and leg take symptoms, balance gait and strength to help him avoid falls.    5. Rash and nonspecific skin eruption Rashes improved only a slight amount he states to his trunk but continues to be extremely itchy Hydroxyzine  10 mg 3 times daily as needed prescribed  He has derm f/up  6. Ankle wound, left, initial encounter See picture and last OV images -scabbing and wound has improved, he is taking doxycycline , encouraged him to complete the course of antibiotics and he does have follow-up with vascular specialist  7. Long term current use of anticoagulant Eliquis  10 mg BID to 5 mg BID and f/up with hematology arranged      Michelene Cower, PA-C 01/29/24 11:07 AM

## 2024-01-29 NOTE — Patient Instructions (Signed)
 Restart losartan  25 mg once daily Also start the hydralazine  25 mg twice a day if you BP stays of 160/100 We can slowly decrease hydralazine  dose as the losartan  gets in and works, but it will take 2+ weeks

## 2024-01-31 ENCOUNTER — Encounter: Payer: Self-pay | Admitting: Family Medicine

## 2024-02-01 ENCOUNTER — Ambulatory Visit: Payer: Self-pay | Admitting: Family Medicine

## 2024-02-01 ENCOUNTER — Ambulatory Visit: Payer: Medicaid Other | Admitting: Dermatology

## 2024-02-01 DIAGNOSIS — L853 Xerosis cutis: Secondary | ICD-10-CM

## 2024-02-01 DIAGNOSIS — D692 Other nonthrombocytopenic purpura: Secondary | ICD-10-CM

## 2024-02-01 DIAGNOSIS — L300 Nummular dermatitis: Secondary | ICD-10-CM | POA: Diagnosis not present

## 2024-02-01 DIAGNOSIS — L309 Dermatitis, unspecified: Secondary | ICD-10-CM | POA: Diagnosis not present

## 2024-02-01 MED ORDER — CLOBETASOL PROPIONATE 0.05 % EX SOLN: CUTANEOUS | 0 refills | Status: DC

## 2024-02-01 NOTE — Patient Instructions (Addendum)
 Eczema Skin Care  Use 4 weeks if itchy, if not itchy can stop cream mixture and just use Cerave moisturizer    Buy TWO 16oz jars of CeraVe moisturizing cream  [facility], [facility], [facility] (no prescription needed)  Costs about $15 per jar   Jar #1: Use as a moisturizer as needed. Can be applied to any area of the body. Use twice daily to unaffected areas.  Jar #2: Pour one 50ml bottle of clobetasol  0.05% solution into jar, mix well. Label this jar to indicate the medication has been added. Use twice daily to affected areas. Do not apply to face, groin or underarms.  Moisturizer may burn or sting initially. Try for at least 4 weeks.   Avoid applying to face, groin, and axilla. Use as directed. Long-term use can cause thinning of the skin.  Topical steroids (such as triamcinolone , fluocinolone, fluocinonide, mometasone , clobetasol , halobetasol, betamethasone , hydrocortisone) can cause thinning and lightening of the skin if they are used for too long in the same area. Your physician has selected the right strength medicine for your problem and area affected on the body. Please use your medication only as directed by your physician to prevent side effects.        Look for lever 2000 for moisture   Gentle Skin Care Guide  1. Bathe no more than once a day.  2. Avoid bathing in hot water   3. Use a mild soap like Dove, Vanicream, Cetaphil, CeraVe. Can use Lever 2000 or Cetaphil antibacterial soap  4. Use soap only where you need it. On most days, use it under your arms, between your legs, and on your feet. Let the water  rinse other areas unless visibly dirty.  5. When you get out of the bath/shower, use a towel to gently blot your skin dry, don't rub it.  6. While your skin is still a little damp, apply a moisturizing cream such as Vanicream, CeraVe, Cetaphil, Eucerin, Sarna lotion or plain Vaseline Jelly. For hands apply Neutrogena [place] Hand Cream or Excipial Hand Cream.  7. Reapply  moisturizer any time you start to itch or feel dry.  8. Sometimes using free and clear laundry detergents can be helpful. Fabric softener sheets should be avoided. Downy Free & Gentle liquid, or any liquid fabric softener that is free of dyes and perfumes, it acceptable to use  9. If your doctor has given you prescription creams you may apply moisturizers over them   Due to recent changes in healthcare laws, you may see results of your pathology and/or laboratory studies on MyChart before the doctors have had a chance to review them. We understand that in some cases there may be results that are confusing or concerning to you. Please understand that not all results are received at the same time and often the doctors may need to interpret multiple results in order to provide you with the best plan of care or course of treatment. Therefore, we ask that you please give us  2 business days to thoroughly review all your results before contacting the office for clarification. Should we see a critical lab result, you will be contacted sooner.   If You Need Anything After Your Visit  If you have any questions or concerns for your doctor, please call our main line at [phone] and press option 4 to reach your doctor's medical assistant. If no one answers, please leave a voicemail as directed and we will return your call as soon as possible. Messages left after 4  pm will be answered the following business day.   You may also send us  a message via MyChart. We typically respond to MyChart messages within 1-2 business days.  For prescription refills, please ask your pharmacy to contact our office. Our fax number is [phone].  If you have an urgent issue when the clinic is closed that cannot wait until the next business day, you can page your doctor at the number below.    Please note that while we do our best to be available for urgent issues outside of office hours, we are not available 24/7.   If you  have an urgent issue and are unable to reach us , you may choose to seek medical care at your doctor's office, retail clinic, urgent care center, or emergency room.  If you have a medical emergency, please immediately call 911 or go to the emergency department.  Pager Numbers  - Dr. Bary Likes: [phone]  - Dr. Annette Barters: [phone]  - Dr. Felipe Horton: [phone]   In the event of inclement weather, please call our main line at [phone] for an update on the status of any delays or closures.  Dermatology Medication Tips: Please keep the boxes that topical medications come in in order to help keep track of the instructions about where and how to use these. Pharmacies typically print the medication instructions only on the boxes and not directly on the medication tubes.   If your medication is too expensive, please contact our office at [phone] option 4 or send us  a message through MyChart.   We are unable to tell what your co-pay for medications will be in advance as this is different depending on your insurance coverage. However, we may be able to find a substitute medication at lower cost or fill out paperwork to get insurance to cover a needed medication.   If a prior authorization is required to get your medication covered by your insurance company, please allow us  1-2 business days to complete this process.  Drug prices often vary depending on where the prescription is filled and some pharmacies may offer cheaper prices.  The website www.goodrx.com contains coupons for medications through different pharmacies. The prices here do not account for what the cost may be with help from insurance (it may be cheaper with your insurance), but the website can give you the price if you did not use any insurance.  - You can print the associated coupon and take it with your prescription to the pharmacy.  - You may also stop by our office during regular business hours and pick up a GoodRx coupon  card.  - If you need your prescription sent electronically to a different pharmacy, notify our office through [facility] or by phone at [phone] option 4.     Si Usted Necesita Algo Despus de Su Visita  Tambin puede enviarnos un mensaje a travs de Clinical cytogeneticist. Por lo general respondemos a los mensajes de MyChart en el transcurso de 1 a 2 das hbiles.  Para renovar recetas, por favor pida a su farmacia que se ponga en contacto con nuestra oficina. Franz Jacks de fax es [place] [phone].  Si tiene un asunto urgente cuando la clnica est cerrada y que no puede esperar hasta el siguiente da hbil, puede llamar/localizar a su doctor(a) al nmero que aparece a continuacin.   Por favor, tenga en cuenta que aunque hacemos todo lo posible para estar disponibles para asuntos urgentes fuera del horario de oficina, no estamos disponibles las  24 horas del Futures trader, los 7 [address]  [address] de la [place].   Si tiene un problema urgente y no puede comunicarse con nosotros, puede optar por buscar atencin mdica  en el consultorio de su doctor(a), en una clnica privada, en un centro de atencin urgente o en una sala de emergencias.  Si tiene Engineer, drilling, por favor llame inmediatamente al 911 o vaya a la sala de emergencias.  Nmeros de bper  - Dr. Bary Likes: [phone]  - Dra. Annette Barters: [phone]  - Dr. Felipe Horton: [phone]   En caso de inclemencias del tiempo, por favor llame a Lajuan Pila principal al [phone] para una actualizacin sobre el [place] de cualquier retraso o cierre.  Consejos para la medicacin en dermatologa: Por favor, guarde las cajas en las que vienen los medicamentos de uso tpico para ayudarle a seguir las instrucciones sobre dnde y cmo usarlos. Las farmacias generalmente imprimen las instrucciones del medicamento slo en las cajas y no directamente en los tubos del [place].   Si su medicamento es muy caro, por favor, pngase en contacto con Bettyjane Brunet llamando al [phone] y presione la opcin 4 o envenos un mensaje a travs de Clinical cytogeneticist.   No podemos decirle cul ser su copago por los medicamentos por adelantado ya que esto es diferente dependiendo de la cobertura de su seguro. Sin embargo, es posible que podamos encontrar un medicamento sustituto a Audiological scientist un formulario para que el seguro cubra el medicamento que se considera necesario.   Si se requiere una autorizacin previa para que su compaa de seguros [place] su medicamento, por favor permtanos de 1 a 2 das hbiles para completar este proceso.  Los precios de los medicamentos varan con frecuencia dependiendo del Environmental consultant de dnde se surte la receta y alguna farmacias pueden ofrecer precios ms baratos.  El sitio web www.goodrx.com tiene cupones para medicamentos de Health and safety inspector. Los precios aqu no tienen en cuenta lo que podra costar con la ayuda del seguro (puede ser ms barato con su seguro), pero el sitio web puede darle el precio si no utiliz Tourist information centre manager.  - Puede imprimir el cupn correspondiente y llevarlo con su receta a la farmacia.  - Tambin puede pasar por nuestra oficina durante el horario de atencin regular y Education officer, museum una tarjeta de cupones de GoodRx.  - Si necesita que su receta se enve electrnicamente a una farmacia diferente, informe a nuestra oficina a travs de MyChart de  o por telfono llamando al [phone] y presione la opcin 4.

## 2024-02-01 NOTE — Progress Notes (Signed)
 Follow-Up Visit   Subjective  Jonathon Snow is a 62 y.o. male who presents for the following: patient reports he has a developed a rash after stay in hospital in November. Reports he developed red itchy splotches on arms, shoulders, chest, and upper back.  Patient states that he was taken off prednisone  which he had been taking for 12 years.  Was seen by primary care and given some  hydroxyzine  to take as needed for itchy skin and referred to [facility].   The patient has spots, moles and lesions to be evaluated, some may be new or changing and the patient may have concern these could be cancer.   The following portions of the chart were reviewed this encounter and updated as appropriate: medications, allergies, medical history  Review of Systems:  No other skin or systemic complaints except as noted in HPI or Assessment and Plan.  Objective  Well appearing patient in no apparent distress; mood and affect are within normal limits.   A focused examination was performed of the following areas: B/l arms, back, chest, hands, neck  Relevant exam findings are noted in the Assessment and Plan.    Assessment & Plan   Xerosis - diffuse xerotic patches - recommend gentle, hydrating skin care - gentle skin care handout given  NUMMULAR DERMATITIS  Exam: Pink scaly patch at left upper arm,  pink scaly patch on right shoulder, chest, and upper back   Chronic and persistent condition with duration or expected duration over one year. Condition is bothersome/symptomatic for patient. Currently flared.   Nummular dermatitis (eczema) is a chronic, relapsing, pruritic condition that can significantly affect quality of life. It is often associated with dry skin can require treatment with prescription topical medications, in addition to dry skin care.  If there is underlying atopic dermatitis and topicals are not working, then biologic injections may be necessary to control symptoms.   Treatment  Plan:  Start clobetasol /CeraVe mix twice daily x 4 weeks to aas body, avoid f/g/a   Eczema Skin Care  Use for 4 weeks   Buy TWO 16oz jars of CeraVe moisturizing cream  [facility], [facility], [facility] (no prescription needed)  Costs about $15 per jar   Jar #1: Use as a moisturizer as needed. Can be applied to any area of the body. Use twice daily to unaffected areas.  Jar #2: Pour one 50ml bottle of clobetasol  0.05% solution into jar, mix well. Label this jar to indicate the medication has been added. Use twice daily to affected areas. Do not apply to face, groin or underarms.  Moisturizer may burn or sting initially. Try for at least 4 weeks.   Avoid applying to face, groin, and axilla. Use as directed. Long-term use can cause thinning of the skin.   Topical steroids (such as triamcinolone , fluocinolone, fluocinonide, mometasone , clobetasol , halobetasol, betamethasone , hydrocortisone) can cause thinning and lightening of the skin if they are used for too long in the same area. Your physician has selected the right strength medicine for your problem and area affected on the body. Please use your medication only as directed by your physician to prevent side effects.     Purpura - Chronic; persistent and recurrent.  Treatable, but not curable. - Violaceous macules and patches - Benign - Related to trauma, age, sun damage and/or use of blood thinners, chronic use of topical and/or oral steroids - Observe - Can use OTC arnica containing moisturizer such as Dermend Bruise Formula if desired - Call  for worsening or other concerns  DERMATITIS   Related Medications clobetasol  (TEMOVATE ) 0.05 % external solution Apply topically to affected areas as directed in patient handout as needed for rash. Avoid applying to face, groin, and axilla. Use as directed.  Return if symptoms worsen or fail to improve.  I, Randee Busing, CMA, am acting as scribe for Artemio Larry, MD.   Documentation: I have  reviewed the above documentation for accuracy and completeness, and I agree with the above.  Artemio Larry, MD

## 2024-02-02 ENCOUNTER — Other Ambulatory Visit (INDEPENDENT_AMBULATORY_CARE_PROVIDER_SITE_OTHER): Payer: Self-pay | Admitting: Nurse Practitioner

## 2024-02-02 ENCOUNTER — Encounter (INDEPENDENT_AMBULATORY_CARE_PROVIDER_SITE_OTHER): Payer: Self-pay | Admitting: Nurse Practitioner

## 2024-02-02 ENCOUNTER — Ambulatory Visit (INDEPENDENT_AMBULATORY_CARE_PROVIDER_SITE_OTHER): Payer: Medicaid Other

## 2024-02-02 ENCOUNTER — Ambulatory Visit (INDEPENDENT_AMBULATORY_CARE_PROVIDER_SITE_OTHER): Payer: Medicaid Other | Admitting: Nurse Practitioner

## 2024-02-02 VITALS — BP 167/104 | HR 80 | Resp 18 | Ht 75.0 in | Wt 225.4 lb

## 2024-02-02 DIAGNOSIS — I739 Peripheral vascular disease, unspecified: Secondary | ICD-10-CM

## 2024-02-02 DIAGNOSIS — I890 Lymphedema, not elsewhere classified: Secondary | ICD-10-CM

## 2024-02-02 DIAGNOSIS — I82402 Acute embolism and thrombosis of unspecified deep veins of left lower extremity: Secondary | ICD-10-CM

## 2024-02-03 ENCOUNTER — Telehealth: Payer: Self-pay | Admitting: Family Medicine

## 2024-02-03 NOTE — Telephone Encounter (Signed)
Created on: 01/21/2024 12:56 PM By: Cheri Guppy   Primary Information  Source  Jonathon Snow (Patient)   Subject  Jonathon Snow (Patient)   Topic  General - Other    Summary  Verbal Orders  Communication  Reason for CRM: Home Health Verbal Orders - Caller/Agency: [facility]    Callback Number: [phone]-Secured line    Service Requested: Continuation Occupational Therapy    Frequency: 1 week 4 and then every other week for 4 weeks    Any new concerns about the patient? No    Sh[facility]en called back today following up on request from 01/21/2024. They have not yet received a response, please advise

## 2024-02-03 NOTE — Progress Notes (Signed)
Subjective:    Patient ID: Jonathon Snow, male    DOB: [birth year], 63 y.o.   MRN: [id number] Chief Complaint  Patient presents with   Follow-up    - ABI + consult. recurrent DVT + ankle wound + claudication of LLE. 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  He previously had thrombus noted in his tibial vessels but suddenly began having worsening swelling in the left lower extremity.  He also has a history of lymphedema but this swelling was worse than his standard swelling.  He was ultimately found to have DVT present from the distal common femoral vein down to the tibial vessels.  This was largely mixed as acute/chronic.  The patient was recently hospitalized and at that time he was taken from 5 mg of Eliquis twice a day to 2.5 mg twice a day.  Previously he had been on 5 mg of Eliquis without any issues.  He previously had an IVC filter which was removed.  He is also been having pain in his left leg when he walks.  It is described as claudication but he also has significant lower back issues as well.  Today the patient's left lower extremity has triphasic waveforms throughout.  We were unable to do an ABI given the recent thrombus.  He has recannulized portions of of his thrombus in the left lower extremity.  There is noted to have some retrograde flow in the proximal portion of the anterior tibial artery but even with this abnormality overall he has fairly good perfusion.    Review of Systems  Cardiovascular:  Positive for leg swelling.  Skin:  Negative for wound.  All other systems reviewed and are negative.      Objective:   Physical Exam Vitals reviewed.  HENT:     Head: Normocephalic.  Cardiovascular:     Rate and Rhythm: Normal rate.  Pulmonary:     Effort: Pulmonary effort is normal.  Musculoskeletal:     Left lower leg: 2+ Edema present.  Skin:    General: Skin is warm and dry.  Neurological:      Mental Status: He is alert and oriented to person, place, and time.  Psychiatric:        Mood and Affect: Mood normal.        Behavior: Behavior normal.        Thought Content: Thought content normal.        Judgment: Judgment normal.     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   Overview:  superior sagittal sinus, left transverse sinus and cortical veins    Closed left hip fracture, initial encounter (HCC) 02/18/2023   COVID-19 virus infection 12/2020   Depression    DVT (deep venous thrombosis) (HCC)    Dyspnea    GERD (gastroesophageal reflux disease)    Head injury 11/05/2023   Heart attack (HCC) 11/05/2023   Heel spur, left 02/16/2019   Heel spur, right 02/16/2019   Hematoma of groin 11/08/2023   Herpes zoster infection of lumbar region 02/20/2020   History of DVT (deep vein thrombosis) 03/22/2020  History of kidney stones    Hyperlipidemia    Hypertension    Long term current use of systemic steroids 11/08/2020   Lupus    Lymphedema 10/07/2018   Morbid obesity (HCC)    Opiate abuse, episodic (HCC) 02/26/2018   Osteoporosis    Pain and swelling of right lower extremity 02/03/2023   Pneumonia    PONV (postoperative nausea and vomiting)    Postphlebitic syndrome with ulcer, 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   Presence of IVC filter 03/22/2020   Removed   Pulmonary embolism (HCC)    Renal disorder    Stage III   Severe episode of recurrent major depressive disorder, 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   Shock circulatory (HCC) 11/06/2023   STEMI (ST elevation myocardial infarction) (HCC) 11/05/2023    Social History   Socioeconomic History   Marital status: Divorced    Spouse name: Not on file   Number of children: Not on file   Years of  education: Not on file   Highest education level: Not on file  Occupational History   Not on file  Tobacco Use   Smoking status: Never   Smokeless tobacco: Never  Vaping Use   Vaping status: Never Used  Substance and Sexual Activity   Alcohol use: No   Drug use: No    Comment: PT DENIES   Sexual activity: Yes  Other Topics Concern   Not on file  Social History Narrative   Not on file   Social Drivers of Health   Financial Resource Strain: Not on file  Food Insecurity: No Food Insecurity (11/24/2023)   Hunger Vital Sign    Worried About Running Out of Food in the Last Year: Never true    Ran Out of Food in the Last Year: Never true  Transportation Needs: No Transportation Needs (11/24/2023)   PRAPARE - Administrator, Civil Service (Medical): No    Lack of Transportation (Non-Medical): No  Physical Activity: Not on file  Stress: Not on file  Social Connections: Unknown (04/25/2022)   Received [facility]   Social Network    Social Network: Not on file  Intimate Partner Violence: Not At Risk (11/24/2023)   Humiliation, Afraid, Rape, and Kick questionnaire    Fear of Current or Ex-Partner: No    Emotionally Abused: No    Physically Abused: No    Sexually Abused: No    Past Surgical History:  Procedure Laterality Date   ANKLE SURGERY Right    BACK SURGERY     BRONCHIAL WASHINGS N/A 11/01/2021   Procedure: BRONCHIAL WASHINGS;  Surgeon: Vida Rigger, MD;  Location: [facility] ORS;  Service: Thoracic;  Laterality: N/A;   COLONOSCOPY WITH PROPOFOL N/A 05/28/2020   Procedure: COLONOSCOPY WITH PROPOFOL;  Surgeon: Wyline Mood, MD;  Location: [facility] ENDOSCOPY;  Service: Endoscopy;  Laterality: N/A;   COLONOSCOPY WITH PROPOFOL N/A 07/01/2023   Procedure: COLONOSCOPY WITH PROPOFOL;  Surgeon: Wyline Mood, MD;  Location: [facility] ENDOSCOPY;  Service: Gastroenterology;  Laterality: N/A;   CORONARY ULTRASOUND/IVUS N/A 11/05/2023   Procedure: Coronary  Ultrasound/IVUS;  Surgeon: Yvonne Kendall, MD;  Location: [facility] INVASIVE CV LAB;  Service: Cardiovascular;  Laterality: N/A;   CORONARY/GRAFT ACUTE MI REVASCULARIZATION N/A 11/05/2023   Procedure: Coronary/Graft Acute MI Revascularization;  Surgeon: Yvonne Kendall, MD;  Location: [facility] INVASIVE CV LAB;  Service: Cardiovascular;  Laterality: N/A;   CYST EXCISION  92 or 93    Liver cyst removal [facility]   FLEXIBLE BRONCHOSCOPY N/A  11/01/2021   Procedure: FLEXIBLE BRONCHOSCOPY;  Surgeon: Vida Rigger, MD;  Location: [facility] ORS;  Service: Thoracic;  Laterality: N/A;   HIP PINNING,CANNULATED Left 02/19/2023   Procedure: PERCUTANEOUS FIXATION OF FEMORAL NECK;  Surgeon: Signa Kell, MD;  Location: [facility] ORS;  Service: Orthopedics;  Laterality: Left;   I & D EXTREMITY Right 04/29/2017   Procedure: IRRIGATION AND DEBRIDEMENT EXTREMITY;  Surgeon: Ricarda Frame, MD;  Location: [facility] ORS;  Service: General;  Laterality: Right;   IRRIGATION AND DEBRIDEMENT ABSCESS Left 04/29/2017   Procedure: IRRIGATION AND DEBRIDEMENT Scrotal ABSCESS;  Surgeon: Ricarda Frame, MD;  Location: [facility] ORS;  Service: General;  Laterality: Left;   LEFT HEART CATH AND CORONARY ANGIOGRAPHY N/A 11/05/2023   Procedure: LEFT HEART CATH AND CORONARY ANGIOGRAPHY;  Surgeon: Yvonne Kendall, MD;  Location: [facility] INVASIVE CV LAB;  Service: Cardiovascular;  Laterality: N/A;   POLYPECTOMY  07/01/2023   Procedure: POLYPECTOMY;  Surgeon: Wyline Mood, MD;  Location: [facility] ENDOSCOPY;  Service: Gastroenterology;;    Family History  Problem Relation Age of Onset   Hypertension Father    Heart disease Father    Clotting disorder Mother    Kidney disease Brother    Heart attack Maternal Grandmother    Heart attack Maternal Grandfather    Heart attack Paternal Grandfather     Allergies  Allergen Reactions   Enalapril Other (See Comments)    Unknown reaction   Vicodin [Hydrocodone-Acetaminophen] Hives and Rash    Severe headaches (also)  NAME BRAND ONLY PER PT CAN TAKE GENERIC       Latest Ref Rng & Units 12/21/2023   12:12 PM 11/25/2023    4:50 AM 11/24/2023    4:42 AM  CBC  WBC 3.4 - 10.8 x10E3/uL 4.3  3.4  4.6   Hemoglobin 13.0 - 17.7 g/dL 57.8  46.9  62.9   Hematocrit 37.5 - 51.0 % 38.1  35.8  34.8   Platelets 150 - 450 x10E3/uL 198  244  255       CMP     Component Value Date/Time   NA 138 01/20/2024 1213   NA 140 12/21/2023 1212   NA 138 04/10/2015 1036   K 4.0 01/20/2024 1213   K 3.5 04/10/2015 1036   CL 106 01/20/2024 1213   CL 108 04/10/2015 1036   CO2 21 (L) 01/20/2024 1213   CO2 25 04/10/2015 1036   GLUCOSE 111 (H) 01/20/2024 1213   GLUCOSE 96 04/10/2015 1036   BUN 15 01/20/2024 1213   BUN 16 12/21/2023 1212   BUN 16 04/10/2015 1036   CREATININE 1.35 (H) 01/20/2024 1213   CREATININE 1.38 (H) 12/14/2023 1143   CALCIUM 9.2 01/20/2024 1213   CALCIUM 8.4 (L) 04/10/2015 1036   PROT 6.1 (L) 11/24/2023 0442   PROT 6.4 08/19/2022 1555   PROT 7.0 04/10/2015 1036   ALBUMIN 2.9 (L) 11/24/2023 0442   ALBUMIN 4.0 08/19/2022 1555   ALBUMIN 3.9 04/10/2015 1036   AST 15 11/24/2023 0442   AST 14 (L) 09/29/2023 0904   ALT 11 11/24/2023 0442   ALT 13 09/29/2023 0904   ALT 16 (L) 04/10/2015 1036   ALKPHOS 207 (H) 11/24/2023 0442   ALKPHOS 71 04/10/2015 1036   BILITOT 1.2 (H) 11/24/2023 0442   BILITOT 0.9 09/29/2023 0904   EGFR 56 (L) 12/21/2023 1212   GFRNONAA 59 (L) 01/20/2024 1213   GFRNONAA 60 (L) 09/29/2023 0904   GFRNONAA 46 (L) 11/07/2020 1020     No results found.  Assessment & Plan:   1. Lymphedema (Primary) The patient will continue with conservative therapies however I have recommended that he not utilize his lymphedema pump for the next 2 weeks.  In the that timeframe he should concentrate on utilizing compression and elevation.  2. 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  At this time we will keep the patient on 5 mg of Eliquis.  It is recommended that he stay on 5 mg of Eliquis at all times.  If he ever requires cessation of his Eliquis for any extended surgeries orthopedic surgeries, he likely will need IVC filter.  As far as swelling in his lower extremities I have recommended that he utilize conservative therapies but he is unable to wear his compression due to the swelling.  I have recommended boots but there messy and difficult for him to be utilized.  Instead we have discussed compression wraps which may be a little bit better for him and provide him more flexibility with her use.  However if his swelling worsens or becomes uncomfortable he is advised to contact us and we can have him return for Unna boots.  Based on the timing of his DVT as well as the chronicity of it, having the patient undergo intervention for thrombectomy would not be beneficial at this time.  We discussed that optimal thrombectomy is for those with an acute DVT less than 2 weeks old.  Will have him return in 8 weeks to determine progress with his swelling.   Current Outpatient Medications on File Prior to Visit  Medication Sig Dispense Refill   albuterol (VENTOLIN HFA) 108 (90 Base) MCG/ACT inhaler Inhale 2 puffs into the lungs every 6 (six) hours as needed for wheezing or shortness of breath. 18 g 2   apixaban (ELIQUIS) 5 MG TABS tablet Take 1 tablet (5 mg total) by mouth 2 (two) times daily. 60 tablet 1   APIXABAN (ELIQUIS) VTE STARTER PACK (10MG  AND 5MG ) Take as directed on package: start with two-5mg  tablets twice daily for 7 days. On day 8, switch to one-5mg  tablet twice daily. 74 each 0   ARIPiprazole (ABILIFY) 2 MG tablet Take 2 mg by mouth daily.     cholecalciferol (VITAMIN D3) 25 MCG (1000 UNIT) tablet Take 1,000 Units by mouth daily.     clobetasol (TEMOVATE) 0.05 % external solution  Apply topically to affected areas as directed in patient handout as needed for rash. Avoid applying to face, groin, and axilla. Use as directed. 50 mL 0   clonazePAM (KLONOPIN) 0.5 MG tablet Take 0.5 mg by mouth daily.     clopidogrel (PLAVIX) 75 MG tablet TAKE 1 TABLET(75 MG) BY MOUTH DAILY 30 tablet 0   cromolyn (OPTICROM) 4 % ophthalmic solution Place 1 drop into both eyes 4 (four) times daily as needed (for itching/allergies). 10 mL 2   gabapentin (NEURONTIN) 100 MG capsule Take 2 capsules (200 mg total) by mouth 3 (three) times daily. 180 capsule 2   hydrALAZINE (APRESOLINE) 25 MG tablet Take 1 tablet (25 mg total) by mouth in the morning and at bedtime. 60 tablet 0   hydrOXYzine (ATARAX) 10 MG tablet Take 1 tablet (10 mg total) by mouth every 8 (eight) hours as needed for itching. 30 tablet 0   levocetirizine (XYZAL) 5 MG  tablet Take 1 tablet (5 mg total) by mouth every evening. 30 tablet 5   losartan (COZAAR) 25 MG tablet Take 1 tablet (25 mg total) by mouth daily. 90 tablet 1   methocarbamol (ROBAXIN) 500 MG tablet Take 1 tablet (500 mg total) by mouth every 8 (eight) hours as needed for muscle spasms. 60 tablet 1   metoprolol succinate (TOPROL-XL) 25 MG 24 hr tablet Take 1 tablet (25 mg total) by mouth daily. 90 tablet 3   midodrine (PROAMATINE) 10 MG tablet Take 1 tablet (10 mg total) by mouth 3 (three) times daily with meals. Skip the dose if SBP >140 mmHg 270 tablet 3   montelukast (SINGULAIR) 10 MG tablet Take 1 tablet (10 mg total) by mouth at bedtime. 90 tablet 3   Multiple Vitamins-Minerals (MULTIVITAMIN WITH MINERALS) tablet Take 1 tablet by mouth daily.     mycophenolate (CELLCEPT) 500 MG tablet Take 2 tablets (1,000 mg total) by mouth 2 (two) times daily. HOLD until neurosurgery is ok to restart this (waiting for incision to heal) (Patient taking differently: Take 500 mg by mouth 2 (two) times daily. HOLD until neurosurgery is ok to restart this (waiting for incision to heal))      nystatin (MYCOSTATIN/NYSTOP) powder Apply 1 Application topically 3 (three) times daily. 15 g 0   nystatin cream (MYCOSTATIN) Apply 1 Application topically 2 (two) times daily. 30 g 0   ofloxacin (OCUFLOX) 0.3 % ophthalmic solution Instill 1 to 2 drops in affected eye every 4 hours for the first 2 days, then instill 1 to 2 drops 4 times daily for an additional 5 days. 5 mL 0   predniSONE (DELTASONE) 10 MG tablet Take 40 mg (4 tabs) po qam x 3 d, then 30 mg poqamx3d, 20 mg poqam x 3d, 10 mg poqam x 3d then 5 mg (1/2 tab) poqam x 4 d 32 tablet 0   rosuvastatin (CRESTOR) 20 MG tablet Take 1 tablet (20 mg total) by mouth daily. 90 tablet 0   senna (SENOKOT) 8.6 MG TABS tablet Take 2 tablets (17.2 mg total) by mouth at bedtime. Stop if loose stool 60 tablet 0   sertraline (ZOLOFT) 100 MG tablet Take 1.5 tablets (150 mg total) by mouth daily. 90 tablet 0   tadalafil (CIALIS) 20 MG tablet Take 1 tablet (20 mg total) by mouth daily as needed for erectile dysfunction. Avoid taking it if low blood pressure     tamsulosin (FLOMAX) 0.4 MG CAPS capsule Take 1 capsule (0.4 mg total) by mouth daily after supper. 30 capsule 0   furosemide (LASIX) 20 MG tablet Take 0.5 tablets (10 mg total) by mouth daily for 14 days. 30 tablet 0   No current facility-administered medications on file prior to visit.    There are no Patient Instructions on file for this visit. No follow-ups on file.   Georgiana Spinner, NP

## 2024-02-04 NOTE — Telephone Encounter (Signed)
Verbal orders given

## 2024-02-08 ENCOUNTER — Other Ambulatory Visit: Payer: Self-pay | Admitting: Internal Medicine

## 2024-02-08 DIAGNOSIS — I130 Hypertensive heart and chronic kidney disease with heart failure and stage 1 through stage 4 chronic kidney disease, or unspecified chronic kidney disease: Secondary | ICD-10-CM | POA: Diagnosis not present

## 2024-02-08 DIAGNOSIS — M810 Age-related osteoporosis without current pathological fracture: Secondary | ICD-10-CM | POA: Diagnosis not present

## 2024-02-08 DIAGNOSIS — N179 Acute kidney failure, unspecified: Secondary | ICD-10-CM | POA: Diagnosis not present

## 2024-02-08 DIAGNOSIS — N401 Enlarged prostate with lower urinary tract symptoms: Secondary | ICD-10-CM | POA: Diagnosis not present

## 2024-02-08 DIAGNOSIS — I2510 Atherosclerotic heart disease of native coronary artery without angina pectoris: Secondary | ICD-10-CM

## 2024-02-08 DIAGNOSIS — Z86711 Personal history of pulmonary embolism: Secondary | ICD-10-CM | POA: Diagnosis not present

## 2024-02-08 DIAGNOSIS — E785 Hyperlipidemia, unspecified: Secondary | ICD-10-CM | POA: Diagnosis not present

## 2024-02-08 DIAGNOSIS — Z955 Presence of coronary angioplasty implant and graft: Secondary | ICD-10-CM | POA: Diagnosis not present

## 2024-02-08 DIAGNOSIS — K219 Gastro-esophageal reflux disease without esophagitis: Secondary | ICD-10-CM | POA: Diagnosis not present

## 2024-02-08 DIAGNOSIS — I5032 Chronic diastolic (congestive) heart failure: Secondary | ICD-10-CM | POA: Diagnosis not present

## 2024-02-08 DIAGNOSIS — J449 Chronic obstructive pulmonary disease, unspecified: Secondary | ICD-10-CM | POA: Diagnosis not present

## 2024-02-08 DIAGNOSIS — I890 Lymphedema, not elsewhere classified: Secondary | ICD-10-CM | POA: Diagnosis not present

## 2024-02-08 DIAGNOSIS — Z86718 Personal history of other venous thrombosis and embolism: Secondary | ICD-10-CM | POA: Diagnosis not present

## 2024-02-08 DIAGNOSIS — S22079D Unspecified fracture of T9-T10 vertebra, subsequent encounter for fracture with routine healing: Secondary | ICD-10-CM | POA: Diagnosis not present

## 2024-02-08 DIAGNOSIS — M329 Systemic lupus erythematosus, unspecified: Secondary | ICD-10-CM | POA: Diagnosis not present

## 2024-02-08 DIAGNOSIS — G8929 Other chronic pain: Secondary | ICD-10-CM | POA: Diagnosis not present

## 2024-02-08 DIAGNOSIS — I951 Orthostatic hypotension: Secondary | ICD-10-CM | POA: Diagnosis not present

## 2024-02-08 DIAGNOSIS — F32A Depression, unspecified: Secondary | ICD-10-CM | POA: Diagnosis not present

## 2024-02-08 DIAGNOSIS — F419 Anxiety disorder, unspecified: Secondary | ICD-10-CM | POA: Diagnosis not present

## 2024-02-08 DIAGNOSIS — Z7901 Long term (current) use of anticoagulants: Secondary | ICD-10-CM | POA: Diagnosis not present

## 2024-02-08 DIAGNOSIS — Z48812 Encounter for surgical aftercare following surgery on the circulatory system: Secondary | ICD-10-CM | POA: Diagnosis not present

## 2024-02-08 DIAGNOSIS — I10 Essential (primary) hypertension: Secondary | ICD-10-CM

## 2024-02-08 DIAGNOSIS — N39498 Other specified urinary incontinence: Secondary | ICD-10-CM | POA: Diagnosis not present

## 2024-02-08 DIAGNOSIS — N1831 Chronic kidney disease, stage 3a: Secondary | ICD-10-CM | POA: Diagnosis not present

## 2024-02-09 NOTE — Telephone Encounter (Signed)
Requested Prescriptions  Pending Prescriptions Disp Refills   clopidogrel (PLAVIX) 75 MG tablet [Pharmacy Med Name: CLOPIDOGREL 75MG  TABLETS] 90 tablet 0    Sig: TAKE 1 TABLET(75 MG) BY MOUTH DAILY     Hematology: Antiplatelets - clopidogrel Failed - 02/09/2024  2:14 PM      Failed - HGB in normal range and within 180 days    Hemoglobin  Date Value Ref Range Status  12/21/2023 11.9 (L) 13.0 - 17.7 g/dL Final         Failed - Cr in normal range and within 360 days    Creat  Date Value Ref Range Status  12/14/2023 1.38 (H) 0.70 - 1.35 mg/dL Final   Creatinine, Ser  Date Value Ref Range Status  01/20/2024 1.35 (H) 0.61 - 1.24 mg/dL Final   Creatinine, Urine  Date Value Ref Range Status  11/08/2023 153 mg/dL Final         Passed - HCT in normal range and within 180 days    Hematocrit  Date Value Ref Range Status  12/21/2023 38.1 37.5 - 51.0 % Final         Passed - PLT in normal range and within 180 days    Platelets  Date Value Ref Range Status  12/21/2023 198 150 - 450 x10E3/uL Final         Passed - Valid encounter within last 6 months    Recent Outpatient Visits           3 weeks ago Swelling of left lower extremity   [facility] Margarita Mail, DO   1 month ago Conjunctivitis of both eyes, unspecified conjunctivitis type   [facility] Danelle Berry, PA-C   1 month ago Hospital discharge follow-up   [facility] Margarita Mail, DO   3 months ago Polyneuropathy   [facility] Berniece Salines, FNP   7 months ago Primary hypertension   [facility] Danelle Berry, [place]       Future Appointments             Tomorrow End, Cristal Deer, MD [facility] [place]   In 3 days Danelle Berry, PA-C [facility]   In 1 month Vanna Scotland, MD [facility] [place]

## 2024-02-10 ENCOUNTER — Ambulatory Visit: Payer: Medicaid Other | Attending: Internal Medicine | Admitting: Internal Medicine

## 2024-02-10 VITALS — BP 154/90 | HR 78 | Ht 74.0 in | Wt 222.4 lb

## 2024-02-10 DIAGNOSIS — I82402 Acute embolism and thrombosis of unspecified deep veins of left lower extremity: Secondary | ICD-10-CM

## 2024-02-10 DIAGNOSIS — I951 Orthostatic hypotension: Secondary | ICD-10-CM

## 2024-02-10 DIAGNOSIS — Z79899 Other long term (current) drug therapy: Secondary | ICD-10-CM

## 2024-02-10 DIAGNOSIS — I10 Essential (primary) hypertension: Secondary | ICD-10-CM

## 2024-02-10 DIAGNOSIS — E785 Hyperlipidemia, unspecified: Secondary | ICD-10-CM | POA: Diagnosis not present

## 2024-02-10 DIAGNOSIS — I5032 Chronic diastolic (congestive) heart failure: Secondary | ICD-10-CM | POA: Diagnosis not present

## 2024-02-10 DIAGNOSIS — I2510 Atherosclerotic heart disease of native coronary artery without angina pectoris: Secondary | ICD-10-CM | POA: Diagnosis not present

## 2024-02-10 DIAGNOSIS — R079 Chest pain, unspecified: Secondary | ICD-10-CM | POA: Diagnosis not present

## 2024-02-10 MED ORDER — LOSARTAN POTASSIUM 50 MG PO TABS: 50.0000 mg | ORAL_TABLET | Freq: Every day | ORAL | 3 refills | Status: DC

## 2024-02-10 NOTE — Patient Instructions (Signed)
Medication Instructions:  Your physician recommends the following medication changes.  STOP TAKING: Midodrine   INCREASE: Losartan to 50 mg by mouth daily  *If you need a refill on your cardiac medications before your next appointment, please call your pharmacy*   Lab Work: Your provider would like for you to have following labs drawn today (BMP).     Testing/Procedures: No test ordered today    Follow-Up: At [facility], you and your health needs are our priority.  As part of our continuing mission to provide you with exceptional heart care, we have created designated Provider Care Teams.  These Care Teams include your primary Cardiologist (physician) and Advanced Practice Providers (APPs -  Physician Assistants and Nurse Practitioners) who all work together to provide you with the care you need, when you need it.  We recommend signing up for the patient portal called "MyChart".  Sign up information is provided on this After Visit Summary.  MyChart is used to connect with patients for Virtual Visits (Telemedicine).  Patients are able to view lab/test results, encounter notes, upcoming appointments, etc.  Non-urgent messages can be sent to your provider as well.   To learn more about what you can do with MyChart, go to [email or web address].    Your next appointment:   4 week(s)  Provider:   You may see Yvonne Kendall, MD or one of the following Advanced Practice Providers on your designated Care Team:   Nicolasa Ducking, NP Eula Listen, PA-C Cadence Fransico Michael, PA-C Charlsie Quest, NP Carlos Levering, NP

## 2024-02-10 NOTE — Progress Notes (Unsigned)
Cardiology Office Note:  .   Date:  02/11/2024  ID:  Jonathon Snow, DOB 11-21-1961, MRN [id number] PCP: Jonathon Berry, PA-C  [facility] Providers Cardiologist:  Jonathon Kendall, MD     History of Present Illness: .   Jonathon Snow is a 63 y.o. male with history of multivessel CAD status post PCI to the distal LCx in the setting of inferior STEMI in 10/2023 in the setting of syncope and T12 Chance fracture, chronic HFpEF, hypertension, hyperlipidemia, DVT/PE, venous sinus thrombosis, lupus, COPD, and chronic kidney disease stage IIIb, who presents for follow-up of CAD and HFpEF.  I last saw him 3 weeks ago, at which time he noticed shortness of breath and chest pain in the setting of recently diagnosed recurrent left lower extremity DVT.  CT of the chest was negative for PE.  He was also hypertensive at that time, prompting [place] to increase metoprolol.  He has seen multiple providers since then and has still been quite hypertensive prompting addition of losartan and hydralazine.  Today, Jonathon Snow reports that he is feeling a little bit better though he still has shortness of breath at times.  Left leg swelling is minimally improved.  Blood pressures are still running high.  He has not needed to take any of his midodrine.  He notes some continued vertigo but no frank lightheadedness or falls.  He also denies palpitations and chest pain.  He has noted some minor epistaxis at times but otherwise no significant bleeding.  He is working on physical therapy under the direction of Dr. Myer Haff.  ROS: See HPI  Studies Reviewed: Jonathon Snow Kitchen   EKG Interpretation Date/Time:  Wednesday February 10 2024 10:48:43 EST Ventricular Rate:  78 PR Interval:  126 QRS Duration:  94 QT Interval:  390 QTC Calculation: 444 R Axis:   38  Text Interpretation: Normal sinus rhythm Normal ECG When compared with ECG of 20-Jan-2024 11:21, No significant change was found Confirmed by Jonathon Snow ([id number]) on 02/10/2024  10:55:48 AM    Risk Assessment/Calculations:     HYPERTENSION CONTROL Vitals:   02/10/24 1041 02/10/24 1103  BP: (!) 178/98 (!) 154/90    The patient's blood pressure is elevated above target today.  In order to address the patient's elevated BP: A current anti-hypertensive medication was adjusted today.          Physical Exam:   VS:  BP (!) 154/90 (Cuff Size: Normal)   Pulse 78   Ht 6\' 2"  (1.88 m)   Wt 222 lb 6.4 oz (100.9 kg)   SpO2 97%   BMI 28.55 kg/m    Wt Readings from Last 3 Encounters:  02/10/24 222 lb 6.4 oz (100.9 kg)  02/02/24 225 lb 6.4 oz (102.2 kg)  01/29/24 227 lb (103 kg)    General:  NAD. Neck: No JVD or HJR. Lungs: Clear to auscultation bilaterally without wheezes or crackles. Heart: Regular rate and rhythm without murmurs, rubs, or gallops. Abdomen: Soft, nontender, nondistended. Extremities: 1+ left calf edema.  ASSESSMENT AND PLAN: .    Coronary artery disease and hyperlipidemia: No angina reported.  Shortness of breath is slightly better and is likely multifactorial.  Given that he seems to be doing a little bit better, we will defer repeat ischemia evaluation for the time being in order to focus our efforts on improving his blood pressure control and continuing anticoagulation for his recurrent left lower extremity DVT.  In the setting of STEMI and PCI to the distal  LCx in 10/2023, we will continue apixaban and clopidogrel though if bleeding becomes more of an issue, we may need to consider switching from clopidogrel to aspirin.  Continue rosuvastatin 20 mg daily.  Recurrent left lower extremity DVT: Continue therapeutic anticoagulation with apixaban.  Given that DVT occurred while on low-dose apixaban for prophylaxis, Jonathon Snow will likely need indefinite therapeutic dose anticoagulation.  Continue follow-up with vascular surgery and hematology.  Chronic HFpEF: Other than left lower extremity edema related to recent DVT, Jonathon Snow appears  euvolemic.  When history of orthostatic hypotension, I will defer adding a standing diuretic at this time.  Uncontrolled hypertension and orthostatic hypotension: Orthostatic issues has been less pronounced as Jonathon Snow has recovered from his spine injury in November.  I will discontinue his midodrine and escalate losartan to 50 mg daily.  Continue hydralazine.  Check BMP today to ensure renal function and electrolytes are stable with reintroduction of losartan about a week ago.    Dispo: Return to clinic in 1 month.  Signed, Jonathon Kendall, MD

## 2024-02-11 ENCOUNTER — Encounter: Payer: Self-pay | Admitting: Internal Medicine

## 2024-02-11 DIAGNOSIS — I951 Orthostatic hypotension: Secondary | ICD-10-CM | POA: Insufficient documentation

## 2024-02-11 LAB — BASIC METABOLIC PANEL
BUN/Creatinine Ratio: 16 (ref 10–24)
BUN: 21 mg/dL (ref 8–27)
CO2: 20 mmol/L (ref 20–29)
Calcium: 9.3 mg/dL (ref 8.6–10.2)
Chloride: 102 mmol/L (ref 96–106)
Creatinine, Ser: 1.28 mg/dL — ABNORMAL HIGH (ref 0.76–1.27)
Glucose: 102 mg/dL — ABNORMAL HIGH (ref 70–99)
Potassium: 4.7 mmol/L (ref 3.5–5.2)
Sodium: 138 mmol/L (ref 134–144)
eGFR: 63 mL/min/{1.73_m2} (ref >59)

## 2024-02-12 ENCOUNTER — Ambulatory Visit: Payer: Medicaid Other | Admitting: Family Medicine

## 2024-02-12 ENCOUNTER — Telehealth: Payer: Self-pay | Admitting: Internal Medicine

## 2024-02-12 NOTE — Telephone Encounter (Signed)
 Patient is returning call in regards to results. Requesting call back.

## 2024-02-12 NOTE — Telephone Encounter (Signed)
The patient has been notified of the result along with recommendations. Pt verbalized understanding. All questions (if any) were answered    End, Cristal Deer, MD to Me      02/11/24  8:41 AM Result Note Please let Jonathon Snow know that his kidney function and electrolytes are stable.  He should proceed with escalation of losartan to 50 mg daily, as discussed yesterday.  Will plan to repeat a BMP when he returns for follow-up in about a month.

## 2024-02-15 ENCOUNTER — Other Ambulatory Visit: Payer: Self-pay

## 2024-02-15 DIAGNOSIS — I825Y2 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: Secondary | ICD-10-CM

## 2024-02-16 ENCOUNTER — Ambulatory Visit: Payer: Medicaid Other | Admitting: Family Medicine

## 2024-02-16 ENCOUNTER — Encounter: Payer: Self-pay | Admitting: Internal Medicine

## 2024-02-16 ENCOUNTER — Ambulatory Visit (INDEPENDENT_AMBULATORY_CARE_PROVIDER_SITE_OTHER): Payer: Medicaid Other | Admitting: Vascular Surgery

## 2024-02-16 ENCOUNTER — Inpatient Hospital Stay: Payer: Medicaid Other | Admitting: Internal Medicine

## 2024-02-16 ENCOUNTER — Encounter: Payer: Self-pay | Admitting: Family Medicine

## 2024-02-16 ENCOUNTER — Inpatient Hospital Stay: Payer: Medicaid Other | Attending: Internal Medicine

## 2024-02-16 VITALS — BP 136/72 | HR 82 | Resp 16 | Ht 74.0 in | Wt 223.0 lb

## 2024-02-16 VITALS — BP 138/78 | HR 83 | Temp 98.0°F | Ht 74.0 in | Wt 223.8 lb

## 2024-02-16 DIAGNOSIS — M79604 Pain in right leg: Secondary | ICD-10-CM

## 2024-02-16 DIAGNOSIS — F332 Major depressive disorder, recurrent severe without psychotic features: Secondary | ICD-10-CM

## 2024-02-16 DIAGNOSIS — Z86718 Personal history of other venous thrombosis and embolism: Secondary | ICD-10-CM | POA: Insufficient documentation

## 2024-02-16 DIAGNOSIS — M3214 Glomerular disease in systemic lupus erythematosus: Secondary | ICD-10-CM

## 2024-02-16 DIAGNOSIS — I10 Essential (primary) hypertension: Secondary | ICD-10-CM

## 2024-02-16 DIAGNOSIS — Z7901 Long term (current) use of anticoagulants: Secondary | ICD-10-CM

## 2024-02-16 DIAGNOSIS — M818 Other osteoporosis without current pathological fracture: Secondary | ICD-10-CM

## 2024-02-16 DIAGNOSIS — I825Y2 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: Secondary | ICD-10-CM

## 2024-02-16 DIAGNOSIS — D699 Hemorrhagic condition, unspecified: Secondary | ICD-10-CM

## 2024-02-16 DIAGNOSIS — I82402 Acute embolism and thrombosis of unspecified deep veins of left lower extremity: Secondary | ICD-10-CM | POA: Diagnosis not present

## 2024-02-16 DIAGNOSIS — R42 Dizziness and giddiness: Secondary | ICD-10-CM | POA: Diagnosis not present

## 2024-02-16 DIAGNOSIS — J449 Chronic obstructive pulmonary disease, unspecified: Secondary | ICD-10-CM | POA: Diagnosis not present

## 2024-02-16 DIAGNOSIS — N1832 Chronic kidney disease, stage 3b: Secondary | ICD-10-CM

## 2024-02-16 DIAGNOSIS — I2510 Atherosclerotic heart disease of native coronary artery without angina pectoris: Secondary | ICD-10-CM | POA: Diagnosis not present

## 2024-02-16 DIAGNOSIS — F419 Anxiety disorder, unspecified: Secondary | ICD-10-CM | POA: Diagnosis not present

## 2024-02-16 DIAGNOSIS — N1831 Chronic kidney disease, stage 3a: Secondary | ICD-10-CM

## 2024-02-16 DIAGNOSIS — N401 Enlarged prostate with lower urinary tract symptoms: Secondary | ICD-10-CM

## 2024-02-16 DIAGNOSIS — Z86711 Personal history of pulmonary embolism: Secondary | ICD-10-CM | POA: Diagnosis not present

## 2024-02-16 DIAGNOSIS — E782 Mixed hyperlipidemia: Secondary | ICD-10-CM | POA: Diagnosis not present

## 2024-02-16 LAB — COMPREHENSIVE METABOLIC PANEL
ALT: 15 U/L (ref 0–44)
AST: 15 U/L (ref 15–41)
Albumin: 3.7 g/dL (ref 3.5–5.0)
Alkaline Phosphatase: 91 U/L (ref 38–126)
Anion gap: 9 (ref 5–15)
BUN: 31 mg/dL — ABNORMAL HIGH (ref 8–23)
CO2: 21 mmol/L — ABNORMAL LOW (ref 22–32)
Calcium: 8.9 mg/dL (ref 8.9–10.3)
Chloride: 106 mmol/L (ref 98–111)
Creatinine, Ser: 1.48 mg/dL — ABNORMAL HIGH (ref 0.61–1.24)
GFR, Estimated: 53 mL/min — ABNORMAL LOW (ref >60)
Glucose, Bld: 94 mg/dL (ref 70–99)
Potassium: 4.2 mmol/L (ref 3.5–5.1)
Sodium: 136 mmol/L (ref 135–145)
Total Bilirubin: 0.6 mg/dL (ref 0.0–1.2)
Total Protein: 7.2 g/dL (ref 6.5–8.1)

## 2024-02-16 LAB — CBC WITH DIFFERENTIAL (CANCER CENTER ONLY)
Abs Immature Granulocytes: 0.02 10*3/uL (ref 0.00–0.07)
Basophils Absolute: 0 10*3/uL (ref 0.0–0.1)
Basophils Relative: 0 %
Eosinophils Absolute: 0.1 10*3/uL (ref 0.0–0.5)
Eosinophils Relative: 3 %
HCT: 40.2 % (ref 39.0–52.0)
Hemoglobin: 12.8 g/dL — ABNORMAL LOW (ref 13.0–17.0)
Immature Granulocytes: 0 %
Lymphocytes Relative: 21 %
Lymphs Abs: 1.1 10*3/uL (ref 0.7–4.0)
MCH: 28.8 pg (ref 26.0–34.0)
MCHC: 31.8 g/dL (ref 30.0–36.0)
MCV: 90.5 fL (ref 80.0–100.0)
Monocytes Absolute: 0.6 10*3/uL (ref 0.1–1.0)
Monocytes Relative: 11 %
Neutro Abs: 3.5 10*3/uL (ref 1.7–7.7)
Neutrophils Relative %: 65 %
Platelet Count: 181 10*3/uL (ref 150–400)
RBC: 4.44 MIL/uL (ref 4.22–5.81)
RDW: 15.3 % (ref 11.5–15.5)
WBC Count: 5.4 10*3/uL (ref 4.0–10.5)
nRBC: 0 % (ref 0.0–0.2)

## 2024-02-16 MED ORDER — TAMSULOSIN HCL 0.4 MG PO CAPS: 0.4000 mg | ORAL_CAPSULE | Freq: Every day | ORAL | 1 refills | Status: DC

## 2024-02-16 MED ORDER — SERTRALINE HCL 100 MG PO TABS: 150.0000 mg | ORAL_TABLET | Freq: Every day | ORAL | 1 refills | Status: AC

## 2024-02-16 MED ORDER — ROSUVASTATIN CALCIUM 20 MG PO TABS: 20.0000 mg | ORAL_TABLET | Freq: Every day | ORAL | 1 refills | Status: DC

## 2024-02-16 MED ORDER — APIXABAN 5 MG PO TABS: 5.0000 mg | ORAL_TABLET | Freq: Two times a day (BID) | ORAL | 1 refills | Status: DC

## 2024-02-16 MED ORDER — HYDRALAZINE HCL 25 MG PO TABS: 25.0000 mg | ORAL_TABLET | Freq: Three times a day (TID) | ORAL | 1 refills | Status: AC

## 2024-02-16 NOTE — Progress Notes (Signed)
 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 SLE/Nephritis [cellcept/prednisolone;~creat 1.5; Dr.Lateef]  Oncology History   No history exists.     HISTORY OF PRESENTING ILLNESS: Alone.  Ambulating independently.  Jonathon Snow 63 y.o.  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  In the interim in January 2025 patient had a DVT of his left lower extremity while taking Eliquis 2.5 mg twice daily.  He is currently on Eliquis 5 mg twice daily as per vascular.  Patient also been evaluated by cardiology for his ongoing hypertension/cardiac issues.  Patient continues to have chronic swelling of his left lower extremity/lymphedema.  Denies any interruptions in his Eliquis.  States his lupus is medically stable.  No blood in stools or black-colored stools.  Review of Systems  Constitutional:  Positive for  malaise/fatigue. Negative for chills, diaphoresis, fever and weight loss.  HENT:  Negative for nosebleeds and sore throat.   Eyes:  Negative for double vision.  Respiratory:  Negative for cough, hemoptysis, sputum production, shortness of breath and wheezing.   Cardiovascular:  Positive for leg swelling. Negative for chest pain, palpitations and orthopnea.  Gastrointestinal:  Negative for abdominal pain, blood in stool, constipation, diarrhea, heartburn, melena, nausea and vomiting.  Genitourinary:  Negative for dysuria, frequency and urgency.  Musculoskeletal:  Negative for back pain and joint pain.  Skin:  Positive for rash.  Neurological:  Negative for dizziness, tingling, focal weakness, weakness and headaches.  Endo/Heme/Allergies:  Does not bruise/bleed easily.  Psychiatric/Behavioral:  Negative for depression. The patient is not nervous/anxious and does not have insomnia.      MEDICAL HISTORY:  Past Medical History:  Diagnosis Date   Acute urinary retention 11/11/2023   Calculus of kidney 08/21/2013   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   Overview:  superior sagittal sinus, left transverse sinus and cortical veins    Closed left hip fracture, initial encounter (HCC) 02/18/2023   COVID-19 virus infection 12/2020   Depression    DVT (deep venous thrombosis) (HCC)    Dyspnea    GERD (gastroesophageal reflux disease)    Head injury 11/05/2023   Heart attack (HCC) 11/05/2023   Heel spur, left 02/16/2019   Heel spur, right 02/16/2019   Hematoma of groin 11/08/2023   Herpes zoster infection of lumbar region 02/20/2020   History of DVT (deep vein thrombosis) 03/22/2020   History of kidney stones    Hyperlipidemia    Hypertension    Long term current use of systemic steroids 11/08/2020   Lupus  Lymphedema 10/07/2018   Morbid obesity (HCC)    Opiate abuse, episodic (HCC) 02/26/2018   Osteoporosis    Pain and swelling of right lower extremity 02/03/2023   Pneumonia     PONV (postoperative nausea and vomiting)    Postphlebitic syndrome with ulcer, 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   Presence of IVC filter 03/22/2020   Removed   Pulmonary embolism (HCC)    Renal disorder    Stage III   Severe episode of recurrent major depressive disorder, 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   Shock circulatory (HCC) 11/06/2023   STEMI (ST elevation myocardial infarction) (HCC) 11/05/2023    SURGICAL HISTORY: Past Surgical History:  Procedure Laterality Date   ANKLE SURGERY Right    BACK SURGERY     BRONCHIAL WASHINGS N/A 11/01/2021   Procedure: BRONCHIAL WASHINGS;  Surgeon: Vida Rigger, MD;  Location: [facility] ORS;  Service: Thoracic;  Laterality: N/A;   COLONOSCOPY WITH PROPOFOL N/A 05/28/2020   Procedure: COLONOSCOPY WITH PROPOFOL;  Surgeon: Wyline Mood, MD;  Location: [facility] ENDOSCOPY;  Service: Endoscopy;  Laterality: N/A;   COLONOSCOPY WITH PROPOFOL N/A 07/01/2023   Procedure: COLONOSCOPY WITH PROPOFOL;  Surgeon: Wyline Mood, MD;  Location: [facility] ENDOSCOPY;  Service: Gastroenterology;  Laterality: N/A;   CORONARY ULTRASOUND/IVUS N/A 11/05/2023   Procedure: Coronary Ultrasound/IVUS;  Surgeon: Yvonne Kendall, MD;  Location: [facility] INVASIVE CV LAB;  Service: Cardiovascular;  Laterality: N/A;   CORONARY/GRAFT ACUTE MI REVASCULARIZATION N/A 11/05/2023   Procedure: Coronary/Graft Acute MI Revascularization;  Surgeon: Yvonne Kendall, MD;  Location: [facility] INVASIVE CV LAB;  Service: Cardiovascular;  Laterality: N/A;   CYST EXCISION  92 or 93    Liver cyst removal [facility]   FLEXIBLE BRONCHOSCOPY N/A 11/01/2021   Procedure: FLEXIBLE BRONCHOSCOPY;  Surgeon: Vida Rigger, MD;  Location: [facility] ORS;  Service: Thoracic;  Laterality: N/A;   HIP PINNING,CANNULATED Left 02/19/2023   Procedure: PERCUTANEOUS FIXATION OF FEMORAL NECK;  Surgeon: Signa Kell, MD;  Location: [facility] ORS;  Service: Orthopedics;  Laterality: Left;   I & D EXTREMITY Right 04/29/2017   Procedure: IRRIGATION AND  DEBRIDEMENT EXTREMITY;  Surgeon: Ricarda Frame, MD;  Location: [facility] ORS;  Service: General;  Laterality: Right;   IRRIGATION AND DEBRIDEMENT ABSCESS Left 04/29/2017   Procedure: IRRIGATION AND DEBRIDEMENT Scrotal ABSCESS;  Surgeon: Ricarda Frame, MD;  Location: [facility] ORS;  Service: General;  Laterality: Left;   LEFT HEART CATH AND CORONARY ANGIOGRAPHY N/A 11/05/2023   Procedure: LEFT HEART CATH AND CORONARY ANGIOGRAPHY;  Surgeon: Yvonne Kendall, MD;  Location: [facility] INVASIVE CV LAB;  Service: Cardiovascular;  Laterality: N/A;   POLYPECTOMY  07/01/2023   Procedure: POLYPECTOMY;  Surgeon: Wyline Mood, MD;  Location: [facility] ENDOSCOPY;  Service: Gastroenterology;;    SOCIAL HISTORY: Social History   Socioeconomic History   Marital status: Divorced    Spouse name: Not on file   Number of children: Not on file   Years of education: Not on file   Highest education level: Not on file  Occupational History   Not on file  Tobacco Use   Smoking status: Never   Smokeless tobacco: Never  Vaping Use   Vaping status: Never Used  Substance and Sexual Activity   Alcohol use: No   Drug use: No    Comment: PT DENIES   Sexual activity: Yes  Other Topics Concern   Not on file  Social History Narrative   Not on file   Social Drivers of Health   Financial Resource Strain: Not on file  Food Insecurity: No  Food Insecurity (11/24/2023)   Hunger Vital Sign    Worried About Running Out of Food in the Last Year: Never true    Ran Out of Food in the Last Year: Never true  Transportation Needs: No Transportation Needs (11/24/2023)   PRAPARE - Administrator, Civil Service (Medical): No    Lack of Transportation (Non-Medical): No  Physical Activity: Not on file  Stress: Not on file  Social Connections: Unknown (04/25/2022)   [facility]   Social Network    Social Network: Not on file  Intimate Partner Violence: Not At Risk (11/24/2023)   Humiliation,  Afraid, Rape, and Kick questionnaire    Fear of Current or Ex-Partner: No    Emotionally Abused: No    Physically Abused: No    Sexually Abused: No    FAMILY HISTORY: Family History  Problem Relation Age of Onset   Hypertension Father    Heart disease Father    Clotting disorder Mother    Kidney disease Brother    Heart attack Maternal Grandmother    Heart attack Maternal Grandfather    Heart attack Paternal Grandfather     ALLERGIES:  is allergic to enalapril and vicodin [hydrocodone-acetaminophen].  MEDICATIONS:  Current Outpatient Medications  Medication Sig Dispense Refill   albuterol (VENTOLIN HFA) 108 (90 Base) MCG/ACT inhaler Inhale 2 puffs into the lungs every 6 (six) hours as needed for wheezing or shortness of breath. 18 g 2   apixaban (ELIQUIS) 5 MG TABS tablet Take 1 tablet (5 mg total) by mouth 2 (two) times daily. 60 tablet 1   APIXABAN (ELIQUIS) VTE STARTER PACK (10MG  AND 5MG ) Take as directed on package: start with two-5mg  tablets twice daily for 7 days. On day 8, switch to one-5mg  tablet twice daily. 74 each 0   ARIPiprazole (ABILIFY) 2 MG tablet Take 2 mg by mouth daily.     cholecalciferol (VITAMIN D3) 25 MCG (1000 UNIT) tablet Take 1,000 Units by mouth daily.     clobetasol (TEMOVATE) 0.05 % external solution Apply topically to affected areas as directed in patient handout as needed for rash. Avoid applying to face, groin, and axilla. Use as directed. 50 mL 0   clonazePAM (KLONOPIN) 0.5 MG tablet Take 0.5 mg by mouth daily.     clopidogrel (PLAVIX) 75 MG tablet TAKE 1 TABLET(75 MG) BY MOUTH DAILY 90 tablet 0   cromolyn (OPTICROM) 4 % ophthalmic solution Place 1 drop into both eyes 4 (four) times daily as needed (for itching/allergies). 10 mL 2   gabapentin (NEURONTIN) 100 MG capsule Take 2 capsules (200 mg total) by mouth 3 (three) times daily. 180 capsule 2   hydrALAZINE (APRESOLINE) 25 MG tablet Take 1 tablet (25 mg total) by mouth in the morning and at  bedtime. 60 tablet 0   hydrOXYzine (ATARAX) 10 MG tablet Take 1 tablet (10 mg total) by mouth every 8 (eight) hours as needed for itching. 30 tablet 0   levocetirizine (XYZAL) 5 MG tablet Take 1 tablet (5 mg total) by mouth every evening. 30 tablet 5   losartan (COZAAR) 50 MG tablet Take 1 tablet (50 mg total) by mouth daily. 90 tablet 3   methocarbamol (ROBAXIN) 500 MG tablet Take 1 tablet (500 mg total) by mouth every 8 (eight) hours as needed for muscle spasms. 60 tablet 1   metoprolol succinate (TOPROL-XL) 25 MG 24 hr tablet Take 1 tablet (25 mg total) by mouth daily. 90 tablet 3  montelukast (SINGULAIR) 10 MG tablet Take 1 tablet (10 mg total) by mouth at bedtime. 90 tablet 3   Multiple Vitamins-Minerals (MULTIVITAMIN WITH MINERALS) tablet Take 1 tablet by mouth daily.     mycophenolate (CELLCEPT) 500 MG tablet Take 2 tablets (1,000 mg total) by mouth 2 (two) times daily. HOLD until neurosurgery is ok to restart this (waiting for incision to heal) (Patient taking differently: Take 500 mg by mouth 2 (two) times daily. HOLD until neurosurgery is ok to restart this (waiting for incision to heal))     nystatin (MYCOSTATIN/NYSTOP) powder Apply 1 Application topically 3 (three) times daily. 15 g 0   nystatin cream (MYCOSTATIN) Apply 1 Application topically 2 (two) times daily. 30 g 0   ofloxacin (OCUFLOX) 0.3 % ophthalmic solution Instill 1 to 2 drops in affected eye every 4 hours for the first 2 days, then instill 1 to 2 drops 4 times daily for an additional 5 days. 5 mL 0   predniSONE (DELTASONE) 10 MG tablet Take 40 mg (4 tabs) po qam x 3 d, then 30 mg poqamx3d, 20 mg poqam x 3d, 10 mg poqam x 3d then 5 mg (1/2 tab) poqam x 4 d 32 tablet 0   rosuvastatin (CRESTOR) 20 MG tablet Take 1 tablet (20 mg total) by mouth daily. 90 tablet 0   senna (SENOKOT) 8.6 MG TABS tablet Take 2 tablets (17.2 mg total) by mouth at bedtime. Stop if loose stool 60 tablet 0   sertraline (ZOLOFT) 100 MG tablet Take 1.5  tablets (150 mg total) by mouth daily. 90 tablet 0   tadalafil (CIALIS) 20 MG tablet Take 1 tablet (20 mg total) by mouth daily as needed for erectile dysfunction. Avoid taking it if low blood pressure     tamsulosin (FLOMAX) 0.4 MG CAPS capsule Take 1 capsule (0.4 mg total) by mouth daily after supper. 30 capsule 0   furosemide (LASIX) 20 MG tablet Take 0.5 tablets (10 mg total) by mouth daily for 14 days. 30 tablet 0   No current facility-administered medications for this visit.       PHYSICAL EXAMINATION:  Vitals:   02/16/24 1017  BP: 138/78  Pulse: 83  Temp: 98 F (36.7 C)  SpO2: 100%    Filed Weights   02/16/24 1017  Weight: 223 lb 12.8 oz (101.5 kg)     Physical Exam Vitals and nursing note reviewed.  HENT:     Head: Normocephalic and atraumatic.     Mouth/Throat:     Pharynx: Oropharynx is clear.  Eyes:     Extraocular Movements: Extraocular movements intact.     Pupils: Pupils are equal, round, and reactive to light.  Cardiovascular:     Rate and Rhythm: Normal rate and regular rhythm.  Pulmonary:     Comments: Decreased breath sounds bilaterally.  Abdominal:     Palpations: Abdomen is soft.  Musculoskeletal:        General: Normal range of motion.     Cervical back: Normal range of motion.  Skin:    General: Skin is warm.  Neurological:     General: No focal deficit present.     Mental Status: He is alert and oriented to person, place, and time.  Psychiatric:        Behavior: Behavior normal.        Judgment: Judgment normal.      LABORATORY DATA:  I have reviewed the data as listed Lab Results  Component Value Date  WBC 5.4 02/16/2024   HGB 12.8 (L) 02/16/2024   HCT 40.2 02/16/2024   MCV 90.5 02/16/2024   PLT 181 02/16/2024   Recent Labs    11/05/23 1840 11/06/23 0425 11/24/23 0442 11/25/23 0450 12/14/23 1143 01/20/24 1213 02/10/24 1152 02/16/24 0941  NA 137   < > 134* 134*   < > 138 138 136  K 4.0   < > 4.1 3.6   < > 4.0 4.7  4.2  CL 109   < > 103 103   < > 106 102 106  CO2 22   < > 19* 22   < > 21* 20 21*  GLUCOSE 96   < > 78 91   < > 111* 102* 94  BUN 24*   < > 18 16   < > 15 21 31*  CREATININE 1.50*   < > 1.57* 1.26*   < > 1.35* 1.28* 1.48*  CALCIUM 7.9*   < > 8.4* 8.3*   < > 9.2 9.3 8.9  GFRNONAA 53*   < > 50* >60  --  59*  --  53*  PROT 5.9*  --  6.1*  --   --   --   --  7.2  ALBUMIN 3.4*  --  2.9*  --   --   --   --  3.7  AST 17  --  15  --   --   --   --  15  ALT 17  --  11  --   --   --   --  15  ALKPHOS 57  --  207*  --   --   --   --  91  BILITOT 1.2*  --  1.2*  --   --   --   --  0.6  BILIDIR  --   --  0.3*  --   --   --   --   --   IBILI  --   --  0.9  --   --   --   --   --    < > = values in this interval not displayed.    RADIOGRAPHIC STUDIES: I have personally reviewed the radiological images as listed and agreed with the findings in the report. DG Thoracic Spine 2 View Result Date: 02/10/2024 CLINICAL DATA:  Thoracic fusion. Other closed fracture of tenth thoracic vertebra. EXAM: THORACIC SPINE 2 VIEWS COMPARISON:  12/22/2023 FINDINGS: Thoracic fusion from T7 through T12 with augmentation at T7, T8, T11 and T12. Pedicle screws and posterior rods. Components appear well positioned. No evidence of hardware loosening or failure. No evidence of progressive vertebral body collapse. No new abnormality. IMPRESSION: Thoracic fusion from T7 through T12 with augmentation at T7, T8, T11 and T12. No evidence of hardware loosening or failure. No evidence of progressive vertebral body collapse. Electronically Signed   By: Paulina Fusi M.D.   On: 02/10/2024 23:11   VAS [place] LOWER EXTREMITY ARTERIAL DUPLEX Result Date: 02/05/2024 LOWER EXTREMITY ARTERIAL DUPLEX STUDY Patient Name:  Jonathon Snow  Date of Exam:   02/02/2024 Medical Rec #: [id number]       Accession #:    [id number] Date of Birth: 1961-04-25      Patient Gender: M Patient Age:   76 years Exam Location:  [facility] Procedure:      VAS [place]  LOWER EXTREMITY ARTERIAL DUPLEX Referring Phys: Sheppard Plumber --------------------------------------------------------------------------------  Indications: Claudication. High Risk Factors:  Hypertension, hyperlipidemia, no history of smoking. Other Factors: Multiple back issues.  Current ABI: Contraindicated secondary to recent DVT Performing Technologist: Hardie Lora RVT  Examination Guidelines: A complete evaluation includes B-mode imaging, spectral Doppler, color Doppler, and power Doppler as needed of all accessible portions of each vessel. Bilateral testing is considered an integral part of a complete examination. Limited examinations for reoccurring indications may be performed as noted.  +----------+--------+-----+--------+---------+---------------------------------+ LEFT      PSV cm/sRatioStenosisWaveform Comments                          +----------+--------+-----+--------+---------+---------------------------------+ CFA [id number]                  triphasicRecanalized thrombus of                                                   undetermined age at the [facility]       +----------+--------+-----+--------+---------+---------------------------------+ DFA       74                   biphasic                                   +----------+--------+-----+--------+---------+---------------------------------+ SFA Prox  73                   triphasic                                  +----------+--------+-----+--------+---------+---------------------------------+ SFA Mid   100                  triphasic                                  +----------+--------+-----+--------+---------+---------------------------------+ SFA Distal85                   triphasic                                  +----------+--------+-----+--------+---------+---------------------------------+ POP Prox  68                   triphasicRecanalized thrombus of                                                    undetermined age at the popliteal                                         vein                              +----------+--------+-----+--------+---------+---------------------------------+ POP Distal62                   triphasic                                  +----------+--------+-----+--------+---------+---------------------------------+  ATA Distal187                  triphasicRetrograde flow is consistent                                             with proximal obstruction         +----------+--------+-----+--------+---------+---------------------------------+ PTA Distal83                   triphasic                                  +----------+--------+-----+--------+---------+---------------------------------+  Summary: Left: Thrombus of undetermined age seen at CFV and popliteal vein. Retrograde flow in anterior tibial may suggest proximal obstruction. No obvious arterial occlusive disease identified.  See table(s) above for measurements and observations. Electronically signed by Festus Barren MD on 02/05/2024 at 9:48:37 AM.    Final    CT Angio Chest Pulmonary Embolism (PE) W or WO Contrast Result Date: 01/20/2024 CLINICAL DATA:  Pulmonary embolism (PE) suspected, high prob EXAM: CT ANGIOGRAPHY CHEST WITH CONTRAST TECHNIQUE: Multidetector CT imaging of the chest was performed using the standard protocol during bolus administration of intravenous contrast. Multiplanar CT image reconstructions and MIPs were obtained to evaluate the vascular anatomy. RADIATION DOSE REDUCTION: This exam was performed according to the departmental dose-optimization program which includes automated exposure control, adjustment of the mA and/or kV according to patient size and/or use of iterative reconstruction technique. CONTRAST:  75mL OMNIPAQUE IOHEXOL 350 MG/ML SOLN COMPARISON:  11/23/2023 FINDINGS: Cardiovascular: Satisfactory opacification of the pulmonary arteries to the  segmental level. No evidence of pulmonary embolism. Thoracic aorta is nonaneurysmal. Atherosclerotic calcifications of the aorta and coronary arteries. Normal heart size. No pericardial effusion. Mediastinum/Nodes: No enlarged mediastinal, hilar, or axillary lymph nodes. Thyroid gland, trachea, and esophagus demonstrate no significant findings. Lungs/Pleura: Trace pleural fluid is present on the right. Linear scarring at the right lung base. No focal airspace consolidation. No pneumothorax. Upper Abdomen: Unchanged cystic lesion in the posterior right hepatic lobe which does not require dedicated follow-up imaging given stability. No acute findings. Musculoskeletal: No acute osseous abnormality. Prior thoracic spinal fusion. No chest wall abnormality. Review of the MIP images confirms the above findings. IMPRESSION: 1. No evidence of pulmonary embolism. 2. Trace right pleural effusion. 3. Aortic and coronary artery atherosclerosis (ICD10-I70.0). Electronically Signed   By: Duanne Guess D.O.   On: 01/20/2024 15:39   US Venous Img Lower Unilateral Left (DVT) Result Date: 01/18/2024 CLINICAL DATA:  Swelling and pain. Previous left popliteal vein thrombus. EXAM: Left LOWER EXTREMITY VENOUS DOPPLER ULTRASOUND TECHNIQUE: Gray-scale sonography with graded compression, as well as color Doppler and duplex ultrasound were performed to evaluate the lower extremity deep venous systems from the level of the common femoral vein and including the common femoral, femoral, profunda femoral, popliteal and calf veins including the posterior tibial, peroneal and gastrocnemius veins when visible. The superficial great saphenous vein was also interrogated. Spectral Doppler was utilized to evaluate flow at rest and with distal augmentation maneuvers in the common femoral, femoral and popliteal veins. COMPARISON:  02/03/2023 ultrasound FINDINGS: Contralateral Common Femoral Vein: Respiratory phasicity is normal and symmetric with  the symptomatic side. No evidence of thrombus. Normal compressibility. Common Femoral Vein: Incomplete compression. Some flow on Doppler. Nonocclusive thrombus. Saphenofemoral Junction:  No evidence of thrombus. Normal compressibility and flow on color Doppler imaging. Profunda Femoral Vein: No evidence of thrombus. Normal compressibility and flow on color Doppler imaging. Femoral Vein: Poor compression with poor flow on Doppler. Midportion of the vessel consistent with some occlusive thrombus. More normal appearance to the distal femoral vein. Popliteal Vein: Once again nonocclusive thrombus as on prior. Calf Veins: No evidence of thrombus. Normal compressibility and flow on color Doppler imaging. Prone a vein is poorly seen. Superficial Great Saphenous Vein: No evidence of thrombus. Normal compressibility. Venous Reflux:  None. Other Findings: None. Critical Value/emergent results were called by telephone at the time of interpretation on 01/18/2024 at 2:19 pm to provider [facility] , who verbally acknowledged these results. IMPRESSION: Areas of thrombus seen now involving nodules the popliteal region but also nonocclusive in the common femoral vein and occlusive mid femoral vein. Electronically Signed   By: Karen Kays M.D.   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  # Given history of patient's lupus/and recurrent DVT-most recent January 2025-while on Eliquis 2.5 mg twice daily-patient needs to be on full-strength anticoagulation with Eliquis 5 mg twice daily indefinitely. Defer to PCP re; eliquis refills. Discussed with PCP.    # Mild chronic intermittent thrombocytopenia- ? Sec to immune process- monitor for now. OCT 2023- [[facility]]-platelets normal- stable   # History of nephritis/lupus/ CKD stage III-cellcept; OFF prednisone following  up with nephrology [Dr.Lateef]. stable.   #DISPOSITION: #  follow up in 12 months- MD;/labs- cbc/cmp; iron studies; ferritin-Dr.B  All questions were answered. The patient knows to call the clinic with any problems, questions or concerns.    Earna Coder, MD 02/16/2024 10:54 AM

## 2024-02-16 NOTE — Assessment & Plan Note (Addendum)
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  # Given history of patient's lupus/and recurrent DVT-most recent January 2025-while on Eliquis 2.5 mg twice daily-patient needs to be on full-strength anticoagulation with Eliquis 5 mg twice daily indefinitely. Defer to PCP re; eliquis refills. Discussed with PCP.    # Mild chronic intermittent thrombocytopenia- ? Sec to immune process- monitor for now. OCT 2023- [[facility]]-platelets normal- stable   # History of nephritis/lupus/ CKD stage III-cellcept; OFF prednisone following up with nephrology [Dr.Lateef]. stable.   #DISPOSITION: #  follow up in 12 months- MD;/labs- cbc/cmp; iron studies; ferritin-Dr.B

## 2024-02-16 NOTE — Progress Notes (Signed)
 Since last visit, pt has had back surgery and a stent placed  in his heart.

## 2024-02-16 NOTE — Progress Notes (Signed)
 Name: Jonathon Snow   MRN: [id number]    DOB: 07-02-1961   Date:02/16/2024       Progress Note  Chief Complaint  Patient presents with   Hypertension    2 week follow-up     Subjective:   Jonathon Snow is a 63 y.o. male, presents to clinic for routine follow up on chronic conditions  F/up on multiple acute and chronic CC  HTN/BP elevated the last 1-2 month Losartan was restarted at 25 mg and then cardiology increased to 50 - renal function labs from this am reviewed eGFR 53  Hydralazine added a few weeks ago (by me) 25 mg BID and pt could increase to TID for persistently elevated BP - with option to gradually titrate meds as needed (some aspect of HTN is due to uncontrolled pain, low back and leg pain) BP Readings from Last 3 Encounters:  02/16/24 138/78  02/10/24 (!) 154/90  02/02/24 (!) 167/104   LE edema and increased leg pain and swelling since recurrent left DVT He has f/up with vascular and hematology Lifelong anticoagulation on 5 mg eliquis BID which PCP (me) will refill and pt will continue to f/up with hem/onc annually   SLE and lupus nephritis primarily managed by nephrology, recently placed referral to get pt reestablished with rheumatology Cellcept dose decreased and prednisone stopped with hospitalization and surgery a few months ago Unclear if many new sx are related to med doses decrease and d/c  rashes, pain, swelling, recurrent DVT (likely due to lower eliquis dose and surgery/stasis)     Prediabetes Lab Results  Component Value Date   HGBA1C 6.0 (H) 10/28/2023   Mood better with higher dose zoloft now on 150 mg    01/07/2024   11:08 AM 10/28/2023    1:32 PM 07/07/2023   10:00 AM  Depression screen PHQ 2/9  Decreased Interest 1 0 3  Down, Depressed, Hopeless 1 0 3  PHQ - 2 Score 2 0 6  Altered sleeping 1  2  Tired, decreased energy 3  2  Change in appetite 0  2  Feeling bad or failure about yourself  0  0  Trouble concentrating 0  2  Moving  slowly or fidgety/restless 0  0  Suicidal thoughts 0  0  PHQ-9 Score 6  14  Difficult doing work/chores   Very difficult   Hyperlipidemia:  crestor 20 mg - will recheck labs at next routine f/up in 3 months   Lab Results  Component Value Date   CHOL 199 11/05/2023   CHOL 225 (H) 09/09/2022   CHOL 139 09/18/2021   Lab Results  Component Value Date   HDL 59 11/05/2023   HDL 56 09/09/2022   HDL 45 09/18/2021   Lab Results  Component Value Date   LDLCALC 119 (H) 11/05/2023   LDLCALC 150 (H) 09/09/2022   LDLCALC 76 09/18/2021   Lab Results  Component Value Date   TRIG 105 11/05/2023   TRIG 86 09/09/2022   TRIG 92 09/18/2021   Lab Results  Component Value Date   CHOLHDL 3.4 11/05/2023   CHOLHDL 4.0 09/09/2022   CHOLHDL 3.1 09/18/2021   No results found for: "LDLDIRECT"       Current Outpatient Medications:    albuterol (VENTOLIN HFA) 108 (90 Base) MCG/ACT inhaler, Inhale 2 puffs into the lungs every 6 (six) hours as needed for wheezing or shortness of breath., Disp: 18 g, Rfl: 2   apixaban (ELIQUIS) 5 MG TABS  tablet, Take 1 tablet (5 mg total) by mouth 2 (two) times daily., Disp: 60 tablet, Rfl: 1   APIXABAN (ELIQUIS) VTE STARTER PACK (10MG  AND 5MG ), Take as directed on package: start with two-5mg  tablets twice daily for 7 days. On day 8, switch to one-5mg  tablet twice daily., Disp: 74 each, Rfl: 0   ARIPiprazole (ABILIFY) 2 MG tablet, Take 2 mg by mouth daily., Disp: , Rfl:    cholecalciferol (VITAMIN D3) 25 MCG (1000 UNIT) tablet, Take 1,000 Units by mouth daily., Disp: , Rfl:    clobetasol (TEMOVATE) 0.05 % external solution, Apply topically to affected areas as directed in patient handout as needed for rash. Avoid applying to face, groin, and axilla. Use as directed., Disp: 50 mL, Rfl: 0   clonazePAM (KLONOPIN) 0.5 MG tablet, Take 0.5 mg by mouth daily., Disp: , Rfl:    clopidogrel (PLAVIX) 75 MG tablet, TAKE 1 TABLET(75 MG) BY MOUTH DAILY, Disp: 90 tablet, Rfl: 0    cromolyn (OPTICROM) 4 % ophthalmic solution, Place 1 drop into both eyes 4 (four) times daily as needed (for itching/allergies)., Disp: 10 mL, Rfl: 2   gabapentin (NEURONTIN) 100 MG capsule, Take 2 capsules (200 mg total) by mouth 3 (three) times daily., Disp: 180 capsule, Rfl: 2   hydrALAZINE (APRESOLINE) 25 MG tablet, Take 1 tablet (25 mg total) by mouth in the morning and at bedtime., Disp: 60 tablet, Rfl: 0   hydrOXYzine (ATARAX) 10 MG tablet, Take 1 tablet (10 mg total) by mouth every 8 (eight) hours as needed for itching., Disp: 30 tablet, Rfl: 0   levocetirizine (XYZAL) 5 MG tablet, Take 1 tablet (5 mg total) by mouth every evening., Disp: 30 tablet, Rfl: 5   losartan (COZAAR) 50 MG tablet, Take 1 tablet (50 mg total) by mouth daily., Disp: 90 tablet, Rfl: 3   methocarbamol (ROBAXIN) 500 MG tablet, Take 1 tablet (500 mg total) by mouth every 8 (eight) hours as needed for muscle spasms., Disp: 60 tablet, Rfl: 1   metoprolol succinate (TOPROL-XL) 25 MG 24 hr tablet, Take 1 tablet (25 mg total) by mouth daily., Disp: 90 tablet, Rfl: 3   montelukast (SINGULAIR) 10 MG tablet, Take 1 tablet (10 mg total) by mouth at bedtime., Disp: 90 tablet, Rfl: 3   Multiple Vitamins-Minerals (MULTIVITAMIN WITH MINERALS) tablet, Take 1 tablet by mouth daily., Disp: , Rfl:    mycophenolate (CELLCEPT) 500 MG tablet, Take 2 tablets (1,000 mg total) by mouth 2 (two) times daily. HOLD until neurosurgery is ok to restart this (waiting for incision to heal) (Patient taking differently: Take 500 mg by mouth 2 (two) times daily. HOLD until neurosurgery is ok to restart this (waiting for incision to heal)), Disp: , Rfl:    nystatin (MYCOSTATIN/NYSTOP) powder, Apply 1 Application topically 3 (three) times daily., Disp: 15 g, Rfl: 0   nystatin cream (MYCOSTATIN), Apply 1 Application topically 2 (two) times daily., Disp: 30 g, Rfl: 0   ofloxacin (OCUFLOX) 0.3 % ophthalmic solution, Instill 1 to 2 drops in affected eye every 4  hours for the first 2 days, then instill 1 to 2 drops 4 times daily for an additional 5 days., Disp: 5 mL, Rfl: 0   rosuvastatin (CRESTOR) 20 MG tablet, Take 1 tablet (20 mg total) by mouth daily., Disp: 90 tablet, Rfl: 0   senna (SENOKOT) 8.6 MG TABS tablet, Take 2 tablets (17.2 mg total) by mouth at bedtime. Stop if loose stool, Disp: 60 tablet, Rfl: 0   sertraline (  ZOLOFT) 100 MG tablet, Take 1.5 tablets (150 mg total) by mouth daily., Disp: 90 tablet, Rfl: 0   tadalafil (CIALIS) 20 MG tablet, Take 1 tablet (20 mg total) by mouth daily as needed for erectile dysfunction. 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) 01/20/2024   Anxiety 12/14/2023   Overweight (BMI 25.0-29.9) 11/23/2023   Chronic heart failure with preserved ejection fraction (HFpEF) (HCC) 11/23/2023   BPH (benign prostatic hyperplasia) 11/23/2023   Unstable angina (HCC) 11/10/2023   Closed T10 spinal fracture (HCC) 11/06/2023   Closed tricolumnar fracture of thoracic vertebra (HCC) 11/06/2023   Thoracic spine instability 11/06/2023   Chronic bilateral low back pain with left-sided sciatica 07/07/2023   Neuropathy 07/07/2023   Hx of colonic polyps 07/01/2023   Adenomatous polyp of colon 07/01/2023   Erectile disorder 01/22/2023   Coronary artery disease involving native coronary artery of native heart without angina pectoris 09/09/2022   Varicose 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 01/03/2022   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) 09/18/2021   Prediabetes 11/08/2020   Stage 3a chronic  kidney disease (HCC) 11/08/2020   Seasonal allergies 11/08/2020   Rhinosinusitis 11/08/2020   Anticoagulant disorder (HCC) 11/07/2020   Senile purpura (HCC) 11/07/2020   MDD (major depressive disorder), recurrent episode, mild (HCC) 09/12/2019   Other spondylosis with radiculopathy, lumbar region 02/16/2019   Osteoporosis 10/12/2018   Chronic venous insufficiency 10/07/2018   Lymphedema 10/07/2018   SLE glomerulonephritis syndrome, 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HCC) 03/03/2018   Acute renal failure with acute tubular necrosis superimposed on stage 3b chronic kidney disease (HCC) 04/27/2017   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) 10/29/2016   Hyperlipidemia LDL goal <70 01/15/2016   Uncontrolled hypertension 01/15/2016   Obesity (BMI 30.0-34.9) 01/15/2016   Long term current use of anticoagulant 10/04/2015   Systemic lupus erythematosus (HCC) 05/30/2015   COPD, moderate (HCC) 06/27/2014   Shortness of breath 06/27/2014   History of pulmonary embolism 12/11/2013   Nodule of right lung 12/11/2013   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   Cystic disease of liver 08/21/2013    Past Surgical History:  Procedure Laterality Date   ANKLE SURGERY Right    BACK SURGERY     BRONCHIAL WASHINGS N/A 11/01/2021   Procedure: BRONCHIAL WASHINGS;  Surgeon: Vida Rigger, MD;  Location: [facility] ORS;  Service: Thoracic;  Laterality: N/A;   COLONOSCOPY WITH PROPOFOL N/A 05/28/2020   Procedure: COLONOSCOPY WITH PROPOFOL;  Surgeon: Wyline Mood, MD;  Location: [facility] ENDOSCOPY;  Service: Endoscopy;  Laterality: N/A;   COLONOSCOPY WITH PROPOFOL N/A 07/01/2023   Procedure: COLONOSCOPY WITH PROPOFOL;  Surgeon: Wyline Mood, MD;  Location: [facility] ENDOSCOPY;  Service: Gastroenterology;  Laterality: N/A;   CORONARY ULTRASOUND/IVUS N/A 11/05/2023   Procedure: Coronary Ultrasound/IVUS;  Surgeon: Yvonne Kendall, MD;  Location: [facility] INVASIVE CV LAB;  Service:  Cardiovascular;  Laterality: N/A;   CORONARY/GRAFT ACUTE MI REVASCULARIZATION N/A 11/05/2023   Procedure: Coronary/Graft Acute MI Revascularization;  Surgeon: Yvonne Kendall, MD;  Location: [facility]  INVASIVE CV LAB;  Service: Cardiovascular;  Laterality: N/A;   CYST EXCISION  92 or 93    Liver cyst removal [facility]   FLEXIBLE BRONCHOSCOPY N/A 11/01/2021   Procedure: FLEXIBLE BRONCHOSCOPY;  Surgeon: Vida Rigger, MD;  Location: [facility] ORS;  Service: Thoracic;  Laterality: N/A;   HIP PINNING,CANNULATED Left 02/19/2023   Procedure: PERCUTANEOUS FIXATION OF FEMORAL NECK;  Surgeon: Signa Kell, MD;  Location: [facility] ORS;  Service: Orthopedics;  Laterality: Left;   I & D EXTREMITY Right 04/29/2017   Procedure: IRRIGATION AND DEBRIDEMENT EXTREMITY;  Surgeon: Ricarda Frame, MD;  Location: [facility] ORS;  Service: General;  Laterality: Right;   IRRIGATION AND DEBRIDEMENT ABSCESS Left 04/29/2017   Procedure: IRRIGATION AND DEBRIDEMENT Scrotal ABSCESS;  Surgeon: Ricarda Frame, MD;  Location: [facility] ORS;  Service: General;  Laterality: Left;   LEFT HEART CATH AND CORONARY ANGIOGRAPHY N/A 11/05/2023   Procedure: LEFT HEART CATH AND CORONARY ANGIOGRAPHY;  Surgeon: Yvonne Kendall, MD;  Location: [facility] INVASIVE CV LAB;  Service: Cardiovascular;  Laterality: N/A;   POLYPECTOMY  07/01/2023   Procedure: POLYPECTOMY;  Surgeon: Wyline Mood, MD;  Location: [facility] ENDOSCOPY;  Service: Gastroenterology;;    Family History  Problem Relation Age of Onset   Hypertension Father    Heart disease Father    Clotting disorder Mother    Kidney disease Brother    Heart attack Maternal Grandmother    Heart attack Maternal Grandfather    Heart attack Paternal Grandfather     Social History   Tobacco Use   Smoking status: Never   Smokeless tobacco: Never  Vaping Use   Vaping status: Never Used  Substance Use Topics   Alcohol use: No   Drug use: No    Comment: PT DENIES     Allergies  Allergen Reactions   Enalapril  Other (See Comments)    Unknown reaction   Vicodin [Hydrocodone-Acetaminophen] Hives and Rash    Severe headaches (also) NAME BRAND ONLY PER PT CAN TAKE GENERIC    Health Maintenance  Topic Date Due   Zoster Vaccines- Shingrix (1 of 2) Never done   Pneumococcal Vaccine 39-42 Years old (3 of 3 - PPSV23 or PCV20) 03/19/2023   COVID-19 Vaccine (6 - 2024-25 season) 08/23/2023   Colonoscopy  06/30/2030   DTaP/Tdap/Td (2 - Td or Tdap) 08/09/2031   INFLUENZA VACCINE  Completed   Hepatitis C Screening  Completed   HIV Screening  Completed   HPV VACCINES  Aged Out    Chart Review Today: I personally reviewed active problem list, medication list, allergies, family history, social history, health maintenance, notes from last encounter, lab results, imaging with the patient/caregiver today.   Review of Systems  Constitutional: Negative.   HENT: Negative.    Eyes: Negative.   Respiratory: Negative.    Cardiovascular: Negative.   Gastrointestinal: Negative.   Endocrine: Negative.   Genitourinary: Negative.   Musculoskeletal: Negative.   Skin: Negative.   Allergic/Immunologic: Negative.   Neurological: Negative.   Hematological: Negative.   Psychiatric/Behavioral: Negative.    All other systems reviewed and are negative.    Objective:   Vitals:   02/16/24 1310  BP: 136/72  Pulse: 82  Resp: 16  SpO2: 98%  Weight: 223 lb (101.2 kg)  Height: 6\' 2"  (1.88 m)    Body mass index is 28.63 kg/m.  Physical Exam Vitals and nursing note reviewed.  Constitutional:      Appearance: He is well-developed.  HENT:     Head: Normocephalic and  atraumatic.     Nose: Nose normal.  Eyes:     General:        Right eye: No discharge.        Left eye: No discharge.     Conjunctiva/sclera: Conjunctivae normal.  Neck:     Trachea: No tracheal deviation.  Cardiovascular:     Rate and Rhythm: Normal rate and regular rhythm.     Comments: LE improving bilaterally, wearing compression  stockings Pulmonary:     Effort: Pulmonary effort is normal. No respiratory distress.     Breath sounds: Normal breath sounds. No stridor.  Skin:    General: Skin is warm and dry.     Findings: No rash.  Neurological:     Mental Status: He is alert.     Motor: No abnormal muscle tone.     Coordination: Coordination normal.  Psychiatric:        Behavior: Behavior normal.      Results for orders placed or performed in visit on 02/16/24  CBC with Differential (Cancer Center Only)   Collection Time: 02/16/24  9:41 AM  Result Value Ref Range   WBC Count 5.4 4.0 - 10.5 K/uL   RBC 4.44 4.22 - 5.81 MIL/uL   Hemoglobin 12.8 (L) 13.0 - 17.0 g/dL   HCT 40.9 81.1 - 91.4 %   MCV 90.5 80.0 - 100.0 fL   MCH 28.8 26.0 - 34.0 pg   MCHC 31.8 30.0 - 36.0 g/dL   RDW 78.2 95.6 - 21.3 %   Platelet Count 181 150 - 400 K/uL   nRBC 0.0 0.0 - 0.2 %   Neutrophils Relative % 65 %   Neutro Abs 3.5 1.7 - 7.7 K/uL   Lymphocytes Relative 21 %   Lymphs Abs 1.1 0.7 - 4.0 K/uL   Monocytes Relative 11 %   Monocytes Absolute 0.6 0.1 - 1.0 K/uL   Eosinophils Relative 3 %   Eosinophils Absolute 0.1 0.0 - 0.5 K/uL   Basophils Relative 0 %   Basophils Absolute 0.0 0.0 - 0.1 K/uL   Immature Granulocytes 0 %   Abs Immature Granulocytes 0.02 0.00 - 0.07 K/uL  Comprehensive metabolic panel   Collection Time: 02/16/24  9:41 AM  Result Value Ref Range   Sodium 136 135 - 145 mmol/L   Potassium 4.2 3.5 - 5.1 mmol/L   Chloride 106 98 - 111 mmol/L   CO2 21 (L) 22 - 32 mmol/L   Glucose, Bld 94 70 - 99 mg/dL   BUN 31 (H) 8 - 23 mg/dL   Creatinine, Ser 0.86 (H) 0.61 - 1.24 mg/dL   Calcium 8.9 8.9 - 57.8 mg/dL   Total Protein 7.2 6.5 - 8.1 g/dL   Albumin 3.7 3.5 - 5.0 g/dL   AST 15 15 - 41 U/L   ALT 15 0 - 44 U/L   Alkaline Phosphatase 91 38 - 126 U/L   Total Bilirubin 0.6 0.0 - 1.2 mg/dL   GFR, Estimated 53 (L) >60 mL/min   Anion gap 9 5 - 15   *Note: Due to a large number of results and/or encounters for  the requested time period, some results have not been displayed. A complete set of results can be found in Results Review.      Assessment & Plan:   Problem List Items Addressed This Visit     COPD, moderate (HCC)   Managed by pulm      Long term current use of anticoagulant  Reviewed hematology recommendations that he remain on 5 mg eliquis BID      Uncontrolled hypertension - Primary   BP controlled today Continue same meds      Relevant Medications   hydrALAZINE (APRESOLINE) 25 MG tablet      Osteoporosis   Needs to reestablish with endocrinology      SLE glomerulonephritis syndrome, 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HCC)   Managed by nephrology and reestablishing with rheumatology      Anticoagulant disorder (HCC)   Long term anticoagulation      Relevant Medications   apixaban (ELIQUIS) 5 MG TABS tablet   Stage 3a chronic kidney disease (HCC)   Per nephrology      Coronary artery disease involving native coronary artery of native heart without angina pectoris   Per cardiology         BPH (benign prostatic hyperplasia)   Meds refilled      Relevant Medications   tamsulosin (FLOMAX) 0.4 MG CAPS capsule   Anxiety   Est with psych and better on higher zoloft dose      Relevant Medications   sertraline (ZOLOFT) 100 MG tablet   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)   Managing with vascular, hematology and long term anticoagulation, sx in leg improving      Relevant Medications   apixaban (ELIQUIS) 5 MG TABS tablet         Other Visit Diagnoses       Severe episode of recurrent major depressive disorder, without psychotic features (HCC)       phq 9 very positive, pt sx not much improved after restarting zoloft, he did not keep close f/up appt, dose increase with close f/up OV, try to est with psych   Relevant Medications   sertraline (ZOLOFT) 100 MG tablet     Mixed hyperlipidemia       meds refilled   Relevant Medications          rosuvastatin (CRESTOR) 20 MG tablet     Episodic lightheadedness       low blood pressure episode 3 d ago, one other episode a few months ago - watch for patterns with his various meds and time of day f/up sooner if recurrent         Labs prior to f/up appt 3 months  Lipids CMP A1C CBC     Return in about 3 months (around 05/15/2024) for Routine follow-up.   Danelle Berry, PA-C 02/16/24 1:18 PM

## 2024-02-17 DIAGNOSIS — M329 Systemic lupus erythematosus, unspecified: Secondary | ICD-10-CM | POA: Diagnosis not present

## 2024-02-17 DIAGNOSIS — Z48812 Encounter for surgical aftercare following surgery on the circulatory system: Secondary | ICD-10-CM | POA: Diagnosis not present

## 2024-02-17 DIAGNOSIS — N1831 Chronic kidney disease, stage 3a: Secondary | ICD-10-CM | POA: Diagnosis not present

## 2024-02-17 DIAGNOSIS — Z7901 Long term (current) use of anticoagulants: Secondary | ICD-10-CM | POA: Diagnosis not present

## 2024-02-17 DIAGNOSIS — I951 Orthostatic hypotension: Secondary | ICD-10-CM | POA: Diagnosis not present

## 2024-02-17 DIAGNOSIS — N401 Enlarged prostate with lower urinary tract symptoms: Secondary | ICD-10-CM | POA: Diagnosis not present

## 2024-02-17 DIAGNOSIS — I2510 Atherosclerotic heart disease of native coronary artery without angina pectoris: Secondary | ICD-10-CM | POA: Diagnosis not present

## 2024-02-17 DIAGNOSIS — Z86711 Personal history of pulmonary embolism: Secondary | ICD-10-CM | POA: Diagnosis not present

## 2024-02-17 DIAGNOSIS — S22079D Unspecified fracture of T9-T10 vertebra, subsequent encounter for fracture with routine healing: Secondary | ICD-10-CM | POA: Diagnosis not present

## 2024-02-17 DIAGNOSIS — N179 Acute kidney failure, unspecified: Secondary | ICD-10-CM | POA: Diagnosis not present

## 2024-02-17 DIAGNOSIS — I5032 Chronic diastolic (congestive) heart failure: Secondary | ICD-10-CM | POA: Diagnosis not present

## 2024-02-17 DIAGNOSIS — G8929 Other chronic pain: Secondary | ICD-10-CM | POA: Diagnosis not present

## 2024-02-17 DIAGNOSIS — I890 Lymphedema, not elsewhere classified: Secondary | ICD-10-CM | POA: Diagnosis not present

## 2024-02-17 DIAGNOSIS — N39498 Other specified urinary incontinence: Secondary | ICD-10-CM | POA: Diagnosis not present

## 2024-02-17 DIAGNOSIS — F32A Depression, unspecified: Secondary | ICD-10-CM | POA: Diagnosis not present

## 2024-02-17 DIAGNOSIS — I130 Hypertensive heart and chronic kidney disease with heart failure and stage 1 through stage 4 chronic kidney disease, or unspecified chronic kidney disease: Secondary | ICD-10-CM | POA: Diagnosis not present

## 2024-02-17 DIAGNOSIS — K219 Gastro-esophageal reflux disease without esophagitis: Secondary | ICD-10-CM | POA: Diagnosis not present

## 2024-02-17 DIAGNOSIS — F419 Anxiety disorder, unspecified: Secondary | ICD-10-CM | POA: Diagnosis not present

## 2024-02-17 DIAGNOSIS — Z955 Presence of coronary angioplasty implant and graft: Secondary | ICD-10-CM | POA: Diagnosis not present

## 2024-02-17 DIAGNOSIS — J449 Chronic obstructive pulmonary disease, unspecified: Secondary | ICD-10-CM | POA: Diagnosis not present

## 2024-02-17 DIAGNOSIS — Z86718 Personal history of other venous thrombosis and embolism: Secondary | ICD-10-CM | POA: Diagnosis not present

## 2024-02-17 DIAGNOSIS — E785 Hyperlipidemia, unspecified: Secondary | ICD-10-CM | POA: Diagnosis not present

## 2024-02-17 DIAGNOSIS — M810 Age-related osteoporosis without current pathological fracture: Secondary | ICD-10-CM | POA: Diagnosis not present

## 2024-02-18 DIAGNOSIS — I2510 Atherosclerotic heart disease of native coronary artery without angina pectoris: Secondary | ICD-10-CM | POA: Diagnosis not present

## 2024-02-18 DIAGNOSIS — M329 Systemic lupus erythematosus, unspecified: Secondary | ICD-10-CM | POA: Diagnosis not present

## 2024-02-18 DIAGNOSIS — Z7901 Long term (current) use of anticoagulants: Secondary | ICD-10-CM | POA: Diagnosis not present

## 2024-02-18 DIAGNOSIS — N401 Enlarged prostate with lower urinary tract symptoms: Secondary | ICD-10-CM | POA: Diagnosis not present

## 2024-02-18 DIAGNOSIS — I130 Hypertensive heart and chronic kidney disease with heart failure and stage 1 through stage 4 chronic kidney disease, or unspecified chronic kidney disease: Secondary | ICD-10-CM | POA: Diagnosis not present

## 2024-02-18 DIAGNOSIS — Z86718 Personal history of other venous thrombosis and embolism: Secondary | ICD-10-CM | POA: Diagnosis not present

## 2024-02-18 DIAGNOSIS — N1831 Chronic kidney disease, stage 3a: Secondary | ICD-10-CM | POA: Diagnosis not present

## 2024-02-18 DIAGNOSIS — E785 Hyperlipidemia, unspecified: Secondary | ICD-10-CM | POA: Diagnosis not present

## 2024-02-18 DIAGNOSIS — N179 Acute kidney failure, unspecified: Secondary | ICD-10-CM | POA: Diagnosis not present

## 2024-02-18 DIAGNOSIS — K219 Gastro-esophageal reflux disease without esophagitis: Secondary | ICD-10-CM | POA: Diagnosis not present

## 2024-02-18 DIAGNOSIS — M810 Age-related osteoporosis without current pathological fracture: Secondary | ICD-10-CM | POA: Diagnosis not present

## 2024-02-18 DIAGNOSIS — Z86711 Personal history of pulmonary embolism: Secondary | ICD-10-CM | POA: Diagnosis not present

## 2024-02-18 DIAGNOSIS — J449 Chronic obstructive pulmonary disease, unspecified: Secondary | ICD-10-CM | POA: Diagnosis not present

## 2024-02-18 DIAGNOSIS — S22079D Unspecified fracture of T9-T10 vertebra, subsequent encounter for fracture with routine healing: Secondary | ICD-10-CM | POA: Diagnosis not present

## 2024-02-18 DIAGNOSIS — G8929 Other chronic pain: Secondary | ICD-10-CM | POA: Diagnosis not present

## 2024-02-18 DIAGNOSIS — F32A Depression, unspecified: Secondary | ICD-10-CM | POA: Diagnosis not present

## 2024-02-18 DIAGNOSIS — I951 Orthostatic hypotension: Secondary | ICD-10-CM | POA: Diagnosis not present

## 2024-02-18 DIAGNOSIS — N39498 Other specified urinary incontinence: Secondary | ICD-10-CM | POA: Diagnosis not present

## 2024-02-18 DIAGNOSIS — I5032 Chronic diastolic (congestive) heart failure: Secondary | ICD-10-CM | POA: Diagnosis not present

## 2024-02-18 DIAGNOSIS — Z48812 Encounter for surgical aftercare following surgery on the circulatory system: Secondary | ICD-10-CM | POA: Diagnosis not present

## 2024-02-18 DIAGNOSIS — I890 Lymphedema, not elsewhere classified: Secondary | ICD-10-CM | POA: Diagnosis not present

## 2024-02-18 DIAGNOSIS — F419 Anxiety disorder, unspecified: Secondary | ICD-10-CM | POA: Diagnosis not present

## 2024-02-18 DIAGNOSIS — Z955 Presence of coronary angioplasty implant and graft: Secondary | ICD-10-CM | POA: Diagnosis not present

## 2024-02-23 ENCOUNTER — Encounter: Payer: Self-pay | Admitting: Urology

## 2024-02-23 DIAGNOSIS — J449 Chronic obstructive pulmonary disease, unspecified: Secondary | ICD-10-CM | POA: Diagnosis not present

## 2024-02-23 DIAGNOSIS — S22079D Unspecified fracture of T9-T10 vertebra, subsequent encounter for fracture with routine healing: Secondary | ICD-10-CM | POA: Diagnosis not present

## 2024-02-23 DIAGNOSIS — F332 Major depressive disorder, recurrent severe without psychotic features: Secondary | ICD-10-CM | POA: Diagnosis not present

## 2024-02-23 DIAGNOSIS — Z48812 Encounter for surgical aftercare following surgery on the circulatory system: Secondary | ICD-10-CM | POA: Diagnosis not present

## 2024-02-23 DIAGNOSIS — I130 Hypertensive heart and chronic kidney disease with heart failure and stage 1 through stage 4 chronic kidney disease, or unspecified chronic kidney disease: Secondary | ICD-10-CM | POA: Diagnosis not present

## 2024-02-23 DIAGNOSIS — M810 Age-related osteoporosis without current pathological fracture: Secondary | ICD-10-CM | POA: Diagnosis not present

## 2024-02-23 DIAGNOSIS — F419 Anxiety disorder, unspecified: Secondary | ICD-10-CM | POA: Diagnosis not present

## 2024-02-23 DIAGNOSIS — I5032 Chronic diastolic (congestive) heart failure: Secondary | ICD-10-CM | POA: Diagnosis not present

## 2024-02-23 DIAGNOSIS — K219 Gastro-esophageal reflux disease without esophagitis: Secondary | ICD-10-CM | POA: Diagnosis not present

## 2024-02-23 DIAGNOSIS — N39498 Other specified urinary incontinence: Secondary | ICD-10-CM | POA: Diagnosis not present

## 2024-02-23 DIAGNOSIS — Z955 Presence of coronary angioplasty implant and graft: Secondary | ICD-10-CM | POA: Diagnosis not present

## 2024-02-23 DIAGNOSIS — N401 Enlarged prostate with lower urinary tract symptoms: Secondary | ICD-10-CM | POA: Diagnosis not present

## 2024-02-23 DIAGNOSIS — F32A Depression, unspecified: Secondary | ICD-10-CM | POA: Diagnosis not present

## 2024-02-23 DIAGNOSIS — N179 Acute kidney failure, unspecified: Secondary | ICD-10-CM | POA: Diagnosis not present

## 2024-02-23 DIAGNOSIS — I890 Lymphedema, not elsewhere classified: Secondary | ICD-10-CM | POA: Diagnosis not present

## 2024-02-23 DIAGNOSIS — G8929 Other chronic pain: Secondary | ICD-10-CM | POA: Diagnosis not present

## 2024-02-23 DIAGNOSIS — Z86718 Personal history of other venous thrombosis and embolism: Secondary | ICD-10-CM | POA: Diagnosis not present

## 2024-02-23 DIAGNOSIS — Z86711 Personal history of pulmonary embolism: Secondary | ICD-10-CM | POA: Diagnosis not present

## 2024-02-23 DIAGNOSIS — N1831 Chronic kidney disease, stage 3a: Secondary | ICD-10-CM | POA: Diagnosis not present

## 2024-02-23 DIAGNOSIS — I951 Orthostatic hypotension: Secondary | ICD-10-CM | POA: Diagnosis not present

## 2024-02-23 DIAGNOSIS — I2510 Atherosclerotic heart disease of native coronary artery without angina pectoris: Secondary | ICD-10-CM | POA: Diagnosis not present

## 2024-02-23 DIAGNOSIS — Z7901 Long term (current) use of anticoagulants: Secondary | ICD-10-CM | POA: Diagnosis not present

## 2024-02-23 DIAGNOSIS — M329 Systemic lupus erythematosus, unspecified: Secondary | ICD-10-CM | POA: Diagnosis not present

## 2024-02-23 DIAGNOSIS — E785 Hyperlipidemia, unspecified: Secondary | ICD-10-CM | POA: Diagnosis not present

## 2024-02-25 ENCOUNTER — Emergency Department

## 2024-02-25 ENCOUNTER — Other Ambulatory Visit: Payer: Self-pay

## 2024-02-25 ENCOUNTER — Encounter: Payer: Self-pay | Admitting: Emergency Medicine

## 2024-02-25 ENCOUNTER — Observation Stay
Admission: EM | Admit: 2024-02-25 | Discharge: 2024-02-27 | Disposition: A | Discharge status: Home / Self Care | Attending: Emergency Medicine | Admitting: Emergency Medicine

## 2024-02-25 ENCOUNTER — Encounter: Payer: Self-pay | Admitting: Family Medicine

## 2024-02-25 ENCOUNTER — Ambulatory Visit: Payer: Self-pay | Admitting: Family Medicine

## 2024-02-25 ENCOUNTER — Telehealth: Payer: Self-pay | Admitting: Family Medicine

## 2024-02-25 DIAGNOSIS — I5032 Chronic diastolic (congestive) heart failure: Secondary | ICD-10-CM | POA: Diagnosis not present

## 2024-02-25 DIAGNOSIS — I951 Orthostatic hypotension: Secondary | ICD-10-CM | POA: Diagnosis not present

## 2024-02-25 DIAGNOSIS — R42 Dizziness and giddiness: Secondary | ICD-10-CM | POA: Diagnosis not present

## 2024-02-25 DIAGNOSIS — I130 Hypertensive heart and chronic kidney disease with heart failure and stage 1 through stage 4 chronic kidney disease, or unspecified chronic kidney disease: Secondary | ICD-10-CM | POA: Diagnosis not present

## 2024-02-25 DIAGNOSIS — M810 Age-related osteoporosis without current pathological fracture: Secondary | ICD-10-CM | POA: Diagnosis not present

## 2024-02-25 DIAGNOSIS — F32A Depression, unspecified: Secondary | ICD-10-CM | POA: Diagnosis not present

## 2024-02-25 DIAGNOSIS — Z7902 Long term (current) use of antithrombotics/antiplatelets: Secondary | ICD-10-CM | POA: Insufficient documentation

## 2024-02-25 DIAGNOSIS — K219 Gastro-esophageal reflux disease without esophagitis: Secondary | ICD-10-CM | POA: Diagnosis not present

## 2024-02-25 DIAGNOSIS — F419 Anxiety disorder, unspecified: Secondary | ICD-10-CM

## 2024-02-25 DIAGNOSIS — E785 Hyperlipidemia, unspecified: Secondary | ICD-10-CM

## 2024-02-25 DIAGNOSIS — Z7901 Long term (current) use of anticoagulants: Secondary | ICD-10-CM | POA: Insufficient documentation

## 2024-02-25 DIAGNOSIS — I10 Essential (primary) hypertension: Secondary | ICD-10-CM

## 2024-02-25 DIAGNOSIS — Z48812 Encounter for surgical aftercare following surgery on the circulatory system: Secondary | ICD-10-CM | POA: Diagnosis not present

## 2024-02-25 DIAGNOSIS — Z79899 Other long term (current) drug therapy: Secondary | ICD-10-CM | POA: Insufficient documentation

## 2024-02-25 DIAGNOSIS — Z86718 Personal history of other venous thrombosis and embolism: Secondary | ICD-10-CM | POA: Insufficient documentation

## 2024-02-25 DIAGNOSIS — S199XXA Unspecified injury of neck, initial encounter: Secondary | ICD-10-CM | POA: Diagnosis not present

## 2024-02-25 DIAGNOSIS — Z8616 Personal history of COVID-19: Secondary | ICD-10-CM | POA: Diagnosis not present

## 2024-02-25 DIAGNOSIS — I5030 Unspecified diastolic (congestive) heart failure: Secondary | ICD-10-CM | POA: Diagnosis not present

## 2024-02-25 DIAGNOSIS — N39498 Other specified urinary incontinence: Secondary | ICD-10-CM | POA: Diagnosis not present

## 2024-02-25 DIAGNOSIS — G8929 Other chronic pain: Secondary | ICD-10-CM | POA: Diagnosis not present

## 2024-02-25 DIAGNOSIS — S62306A Unspecified fracture of fifth metacarpal bone, right hand, initial encounter for closed fracture: Secondary | ICD-10-CM | POA: Diagnosis not present

## 2024-02-25 DIAGNOSIS — N1832 Chronic kidney disease, stage 3b: Secondary | ICD-10-CM | POA: Insufficient documentation

## 2024-02-25 DIAGNOSIS — I2510 Atherosclerotic heart disease of native coronary artery without angina pectoris: Secondary | ICD-10-CM | POA: Diagnosis not present

## 2024-02-25 DIAGNOSIS — K449 Diaphragmatic hernia without obstruction or gangrene: Secondary | ICD-10-CM | POA: Diagnosis not present

## 2024-02-25 DIAGNOSIS — Z86711 Personal history of pulmonary embolism: Secondary | ICD-10-CM | POA: Diagnosis present

## 2024-02-25 DIAGNOSIS — S3993XA Unspecified injury of pelvis, initial encounter: Secondary | ICD-10-CM | POA: Diagnosis not present

## 2024-02-25 DIAGNOSIS — N1831 Chronic kidney disease, stage 3a: Secondary | ICD-10-CM | POA: Diagnosis not present

## 2024-02-25 DIAGNOSIS — M329 Systemic lupus erythematosus, unspecified: Secondary | ICD-10-CM | POA: Diagnosis not present

## 2024-02-25 DIAGNOSIS — G9009 Other idiopathic peripheral autonomic neuropathy: Secondary | ICD-10-CM | POA: Diagnosis not present

## 2024-02-25 DIAGNOSIS — I6523 Occlusion and stenosis of bilateral carotid arteries: Secondary | ICD-10-CM | POA: Diagnosis not present

## 2024-02-25 DIAGNOSIS — Z955 Presence of coronary angioplasty implant and graft: Secondary | ICD-10-CM | POA: Insufficient documentation

## 2024-02-25 DIAGNOSIS — N401 Enlarged prostate with lower urinary tract symptoms: Secondary | ICD-10-CM | POA: Diagnosis not present

## 2024-02-25 DIAGNOSIS — J449 Chronic obstructive pulmonary disease, unspecified: Secondary | ICD-10-CM | POA: Diagnosis not present

## 2024-02-25 DIAGNOSIS — R0781 Pleurodynia: Secondary | ICD-10-CM | POA: Diagnosis not present

## 2024-02-25 DIAGNOSIS — R55 Syncope and collapse: Secondary | ICD-10-CM | POA: Diagnosis not present

## 2024-02-25 DIAGNOSIS — N400 Enlarged prostate without lower urinary tract symptoms: Secondary | ICD-10-CM | POA: Diagnosis not present

## 2024-02-25 DIAGNOSIS — S22079D Unspecified fracture of T9-T10 vertebra, subsequent encounter for fracture with routine healing: Secondary | ICD-10-CM | POA: Diagnosis not present

## 2024-02-25 DIAGNOSIS — M47812 Spondylosis without myelopathy or radiculopathy, cervical region: Secondary | ICD-10-CM | POA: Diagnosis not present

## 2024-02-25 DIAGNOSIS — S3991XA Unspecified injury of abdomen, initial encounter: Secondary | ICD-10-CM | POA: Diagnosis not present

## 2024-02-25 DIAGNOSIS — G629 Polyneuropathy, unspecified: Secondary | ICD-10-CM

## 2024-02-25 DIAGNOSIS — N179 Acute kidney failure, unspecified: Secondary | ICD-10-CM | POA: Diagnosis not present

## 2024-02-25 DIAGNOSIS — I890 Lymphedema, not elsewhere classified: Secondary | ICD-10-CM | POA: Diagnosis not present

## 2024-02-25 LAB — URINALYSIS, ROUTINE W REFLEX MICROSCOPIC
Bacteria, UA: NONE SEEN
Bilirubin Urine: NEGATIVE
Glucose, UA: NEGATIVE mg/dL
Hgb urine dipstick: NEGATIVE
Ketones, ur: NEGATIVE mg/dL
Leukocytes,Ua: NEGATIVE
Nitrite: NEGATIVE
Protein, ur: 30 mg/dL — AB
Specific Gravity, Urine: 1.012 (ref 1.005–1.030)
pH: 5 (ref 5.0–8.0)

## 2024-02-25 LAB — BASIC METABOLIC PANEL
Anion gap: 10 (ref 5–15)
BUN: 26 mg/dL — ABNORMAL HIGH (ref 8–23)
CO2: 19 mmol/L — ABNORMAL LOW (ref 22–32)
Calcium: 9 mg/dL (ref 8.9–10.3)
Chloride: 108 mmol/L (ref 98–111)
Creatinine, Ser: 1.38 mg/dL — ABNORMAL HIGH (ref 0.61–1.24)
GFR, Estimated: 58 mL/min — ABNORMAL LOW (ref >60)
Glucose, Bld: 107 mg/dL — ABNORMAL HIGH (ref 70–99)
Potassium: 4 mmol/L (ref 3.5–5.1)
Sodium: 137 mmol/L (ref 135–145)

## 2024-02-25 LAB — CBC
HCT: 40.3 % (ref 39.0–52.0)
Hemoglobin: 13 g/dL (ref 13.0–17.0)
MCH: 29 pg (ref 26.0–34.0)
MCHC: 32.3 g/dL (ref 30.0–36.0)
MCV: 89.8 fL (ref 80.0–100.0)
Platelets: 180 10*3/uL (ref 150–400)
RBC: 4.49 MIL/uL (ref 4.22–5.81)
RDW: 15.3 % (ref 11.5–15.5)
WBC: 3.8 10*3/uL — ABNORMAL LOW (ref 4.0–10.5)
nRBC: 0 % (ref 0.0–0.2)

## 2024-02-25 LAB — TROPONIN I (HIGH SENSITIVITY)
Troponin I (High Sensitivity): 6 ng/L (ref <18)
Troponin I (High Sensitivity): 7 ng/L (ref <18)

## 2024-02-25 LAB — CBG MONITORING, ED: Glucose-Capillary: 87 mg/dL (ref 70–99)

## 2024-02-25 MED ORDER — OXYCODONE-ACETAMINOPHEN 5-325 MG PO TABS
1.0000 | ORAL_TABLET | ORAL | Status: AC
  Administered 2024-02-25: 1 via ORAL
  Filled 2024-02-25: qty 1

## 2024-02-25 MED ORDER — IOHEXOL 350 MG/ML SOLN
100.0000 mL | Freq: Once | INTRAVENOUS | Status: AC | PRN
  Administered 2024-02-25: 100 mL via INTRAVENOUS

## 2024-02-25 MED ORDER — FENTANYL CITRATE PF 50 MCG/ML IJ SOSY
75.0000 ug | PREFILLED_SYRINGE | Freq: Once | INTRAMUSCULAR | Status: AC
  Administered 2024-02-25: 75 ug via INTRAVENOUS
  Filled 2024-02-25: qty 2

## 2024-02-25 NOTE — Assessment & Plan Note (Signed)
 Reviewed hematology recommendations that he remain on 5 mg eliquis BID

## 2024-02-25 NOTE — Telephone Encounter (Signed)
 Pt called informed of message states when fell, he fell over bathtub and hit abdomen, head and thinks broke finger told him he needs to go to ER

## 2024-02-25 NOTE — Assessment & Plan Note (Signed)
 Per nephrology

## 2024-02-25 NOTE — ED Provider Notes (Signed)
 [facility] Provider Note    Event Date/Time   First MD Initiated Contact with Patient 02/25/24 1903     (approximate)   History   Loss of Consciousness   HPI  Jonathon Snow is a 63 y.o. male reports he passed out in the shower.  He has noticed bruising on the right fourth digit, ring finger of his right hand.  It is sore but still able to move it.  Also reports some tenderness along the ribs in the left side.   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   Physical Exam   Triage Vital Signs: ED Triage Vitals  Encounter Vitals Group     BP 02/25/24 1558 (!) 141/83     Systolic BP Percentile --      Diastolic BP Percentile --      Pulse Rate 02/25/24 1558 (!) 105     Resp 02/25/24 1558 18     Temp 02/25/24 1558 98.1 F (36.7 C)     Temp Source 02/25/24 1558 Oral     SpO2 02/25/24 1558 98 %     Weight 02/25/24 1557 225 lb (102.1 kg)     Height 02/25/24 1557 6\' 2"  (1.88 m)     Head Circumference --      Peak Flow --      Pain Score 02/25/24 1557 9     Pain Loc --      Pain Education --      Exclude from Growth Chart --     Most recent vital signs: Vitals:   02/26/24 0944 02/26/24 1657  BP: (!) 151/85 139/63  Pulse:  80  Resp:  19  Temp:  98 F (36.7 C)  SpO2:  98%     General: Awake, no distress.  CV:  Good peripheral perfusion.  Normal tones and rate.  Some tenderness along the left side of the chest wall no obvious deformities or bruising or hematoma Resp:  Normal effort.  Clear bilateral normal effort Abd:  No distention.  Soft nontender nontoxic except for slight bruising and some tenderness focally in the left lower quadrant over the left anterior abdominal wall there is mild ecchymosis about the size of the palm Other:  Moves all extremities well demonstrates good use of all extremities with exception to the right hand ring finger he reports some tenderness to range of motion and some bruising along the and  close to the proximal IP joint but no obvious deformity.   ED Results / Procedures / Treatments   Labs (all labs ordered are listed, but only abnormal results are displayed) Labs Reviewed  BASIC METABOLIC PANEL - Abnormal; Notable for the following components:      Result Value   CO2 19 (*)    Glucose, Bld 107 (*)    BUN 26 (*)    Creatinine, Ser 1.38 (*)    GFR, Estimated 58 (*)    All other components within normal limits  CBC - Abnormal; Notable for the following components:   WBC 3.8 (*)    All other components within normal limits  URINALYSIS, ROUTINE W REFLEX MICROSCOPIC - Abnormal; Notable for the following components:   Color, Urine YELLOW (*)    APPearance CLEAR (*)    Protein, ur 30 (*)    All other components within normal limits  BASIC METABOLIC PANEL - Abnormal; Notable for the following components:   CO2 18 (*)    Glucose,  Bld 145 (*)    BUN 28 (*)    Creatinine, Ser 1.61 (*)    GFR, Estimated 48 (*)    All other components within normal limits  CBC - Abnormal; Notable for the following components:   WBC 3.4 (*)    RBC 3.97 (*)    Hemoglobin 11.3 (*)    HCT 35.0 (*)    All other components within normal limits  GLUCOSE, CAPILLARY - Abnormal; Notable for the following components:   Glucose-Capillary 136 (*)    All other components within normal limits  CBG MONITORING, ED  TROPONIN I (HIGH SENSITIVITY)  TROPONIN I (HIGH SENSITIVITY)   Labs normal creatinine 1.6 slightly elevated CO2 18 slightly reduced.  Mild leukopenia  EKG  And interpreted by me at approximately 4 PM heart rate 100 QRS 90 QTc 430 Normal sinus rhythm, possible LVH with repolarization abnormality.  No STEMI.   RADIOLOGY   DG Hand Complete Right Result Date: 02/25/2024 CLINICAL DATA:  Fall with fourth digit pain EXAM: RIGHT HAND - COMPLETE 3+ VIEW COMPARISON:  None Available. FINDINGS: No malalignment. Chronic appearing fracture deformity of the fifth metacarpal. Soft tissues are  unremarkable IMPRESSION: No acute osseous abnormality. Chronic appearing fifth metacarpal fracture Electronically Signed   By: Jasmine Pang M.D.   On: 02/25/2024 23:12   CT ABDOMEN PELVIS W CONTRAST Result Date: 02/25/2024 CLINICAL DATA:  Trauma syncopal episode EXAM: CT ABDOMEN AND PELVIS WITH CONTRAST TECHNIQUE: Multidetector CT imaging of the abdomen and pelvis was performed using the standard protocol following bolus administration of intravenous contrast. RADIATION DOSE REDUCTION: This exam was performed according to the departmental dose-optimization program which includes automated exposure control, adjustment of the mA and/or kV according to patient size and/or use of iterative reconstruction technique. CONTRAST:  OMNIPAQUE IOHEXOL 350 MG/ML SOLN COMPARISON:  11/05/2023, 03/17/2018 FINDINGS: Lower chest: Lung bases demonstrate scarring at the peripheral right base. Coronary vascular calcification. Small hiatal hernia Hepatobiliary: Chronic cystic and calcified lesion in the right hepatic lobe. No calcified gallstone or biliary dilatation. Pancreas: Unremarkable. No pancreatic ductal dilatation or surrounding inflammatory changes. Spleen: Normal in size without focal abnormality. Adrenals/Urinary Tract: Adrenal glands are normal. Kidneys show no hydronephrosis. Subcentimeter hypodensities too small to further characterize, no imaging follow-up is recommended. Bladder is unremarkable Stomach/Bowel: Stomach is within normal limits. Appendix appears normal. No evidence of bowel wall thickening, distention, or inflammatory changes. Probable pill fragments in the right colon. Diverticular disease of the left colon. Vascular/Lymphatic: Aortic atherosclerosis. No enlarged abdominal or pelvic lymph nodes. Reproductive: Prostate is unremarkable. Other: Negative for pelvic effusion or free air. Small fat containing umbilical hernia Musculoskeletal: Threaded screws in the proximal left femur with artifact.  Posterior spinal rods and fixating screws to the level of T12. Treated compression deformities of the thoracic spine and at L4. Mild chronic superior endplate deformities at L1, L2 and L3. Left posterior spinal fusion hardware at L5-S1 with additional anterior fusion hardware. Soft tissue stranding within the left lower quadrant abdominal wall soft tissues consistent with contusion IMPRESSION: 1. No CT evidence for acute intra-abdominal or pelvic abnormality. 2. Soft tissue stranding within the left lower quadrant anterior abdominal wall soft tissues consistent with a contusion 3. Multiple chronic findings as discussed above Electronically Signed   By: Jasmine Pang M.D.   On: 02/25/2024 23:11   CT Angio Chest PE W and/or Wo Contrast Result Date: 02/25/2024 CLINICAL DATA:  Left rib pain fall syncope EXAM: CT ANGIOGRAPHY CHEST WITH CONTRAST TECHNIQUE: Multidetector  CT imaging of the chest was performed using the standard protocol during bolus administration of intravenous contrast. Multiplanar CT image reconstructions and MIPs were obtained to evaluate the vascular anatomy. RADIATION DOSE REDUCTION: This exam was performed according to the departmental dose-optimization program which includes automated exposure control, adjustment of the mA and/or kV according to patient size and/or use of iterative reconstruction technique. CONTRAST:  OMNIPAQUE IOHEXOL 350 MG/ML SOLN COMPARISON:  Chest CT 01/20/2024 FINDINGS: Cardiovascular: Satisfactory opacification of the pulmonary arteries to the segmental level. No evidence of pulmonary embolism. Nonaneurysmal aorta. No dissection. Mild atherosclerosis. Coronary vascular calcification. Normal cardiac size. No pericardial effusion Mediastinum/Nodes: Patent trachea. No thyroid mass. No suspicious lymph nodes. Esophagus shows small hiatal hernia Lungs/Pleura: Lungs are clear. No pleural effusion or pneumothorax. Upper Abdomen: See separately dictated abdominopelvic CT  Musculoskeletal: Posterior spinal fusion hardware from T7, inferior extent incompletely included. Treated compression deformities at T7, T8, T11 and T12. Chronic compression deformities at T1, T2 and T3. Review of the MIP images confirms the above findings. IMPRESSION: 1. Negative for acute pulmonary embolism or aortic dissection 2. Clear lung fields Electronically Signed   By: Jasmine Pang M.D.   On: 02/25/2024 23:01   CT Cervical Spine Wo Contrast Result Date: 02/25/2024 CLINICAL DATA:  Trauma syncope EXAM: CT CERVICAL SPINE WITHOUT CONTRAST TECHNIQUE: Multidetector CT imaging of the cervical spine was performed without intravenous contrast. Multiplanar CT image reconstructions were also generated. RADIATION DOSE REDUCTION: This exam was performed according to the departmental dose-optimization program which includes automated exposure control, adjustment of the mA and/or kV according to patient size and/or use of iterative reconstruction technique. COMPARISON:  CT 11/05/2023 FINDINGS: Alignment: Stable alignment. No subluxation. Facet alignment is normal Skull base and vertebrae: Mild chronic compression deformities at T1, T2 and T3. No acute fracture is seen. Soft tissues and spinal canal: No prevertebral fluid or swelling. No visible canal hematoma. Disc levels:  Moderate multilevel degenerative changes. Upper chest: Multiple punctate parotid calcifications with atrophy of the salivary gland, correlate for any history of inflammatory or autoimmune disease. Lung apices are clear Other: None IMPRESSION: Degenerative changes.  No acute osseous abnormality. Electronically Signed   By: Jasmine Pang M.D.   On: 02/25/2024 22:54      PROCEDURES:  Critical Care performed: No  Procedures   MEDICATIONS ORDERED IN ED: Medications  hydrALAZINE (APRESOLINE) tablet 25 mg (25 mg Oral Given 02/26/24 0944)  losartan (COZAAR) tablet 50 mg (50 mg Oral Given 02/26/24 0943)  metoprolol succinate (TOPROL-XL) 24 hr  tablet 25 mg (25 mg Oral Given 02/26/24 0944)  rosuvastatin (CRESTOR) tablet 20 mg (20 mg Oral Given 02/26/24 0944)  hydrOXYzine (ATARAX) tablet 10 mg (has no administration in time range)  sertraline (ZOLOFT) tablet 150 mg (150 mg Oral Given 02/26/24 0942)  apixaban (ELIQUIS) tablet 5 mg (5 mg Oral Given 02/26/24 0942)  clopidogrel (PLAVIX) tablet 75 mg (75 mg Oral Given 02/26/24 0943)  mycophenolate (CELLCEPT) capsule 500 mg (500 mg Oral Given 02/26/24 1105)  clonazePAM (KLONOPIN) tablet 0.5 mg (0.5 mg Oral Given 02/26/24 0943)  gabapentin (NEURONTIN) capsule 200 mg (200 mg Oral Given 02/26/24 0942)  methocarbamol (ROBAXIN) tablet 500 mg (500 mg Oral Given 02/26/24 1423)  cholecalciferol (VITAMIN D3) 25 MCG (1000 UNIT) tablet 1,000 Units (1,000 Units Oral Given 02/26/24 0942)  multivitamin with minerals tablet 1 tablet (1 tablet Oral Given 02/26/24 0942)  loratadine (CLARITIN) tablet 10 mg (has no administration in time range)  montelukast (SINGULAIR) tablet 10 mg (has  no administration in time range)  sodium chloride flush (NS) 0.9 % injection 3 mL (3 mLs Intravenous Not Given 02/26/24 1610)  acetaminophen (TYLENOL) tablet 650 mg (650 mg Oral Given 02/26/24 1423)    Or  acetaminophen (TYLENOL) suppository 650 mg ( Rectal See Alternative 02/26/24 1423)  traZODone (DESYREL) tablet 25 mg (has no administration in time range)  magnesium hydroxide (MILK OF MAGNESIA) suspension 30 mL (has no administration in time range)  ondansetron (ZOFRAN) tablet 4 mg (has no administration in time range)    Or  ondansetron (ZOFRAN) injection 4 mg (has no administration in time range)  meclizine (ANTIVERT) tablet 12.5 mg (has no administration in time range)  fentaNYL (SUBLIMAZE) injection 75 mcg (75 mcg Intravenous Given 02/25/24 2120)  iohexol (OMNIPAQUE) 350 MG/ML injection 100 mL (100 mLs Intravenous Contrast Given 02/25/24 2022)  oxyCODONE-acetaminophen (PERCOCET/ROXICET) 5-325 MG per tablet 1 tablet (1 tablet Oral Given 02/25/24 2355)      IMPRESSION / MDM / ASSESSMENT AND PLAN / ED COURSE  I reviewed the triage vital signs and the nursing notes.                              Differential diagnosis includes, but is not limited to, injuries suffered from syncopal episode after blunt injuries, cause of syncope is not clear but does carry history of thromboembolism previous ACS he had a STEMI once in the setting of syncope today he does not have symptoms that would be highly suggestive of ACS or STEMI with reassuring twelve-lead etc.  Patient is extensible past medical history as well as a spine exam imaging states conduction no obvious fractures noted, no obvious major injuries some bruising of the abdominal wall contusion of the right hand.  No intracranial hemorrhage.  Cause for syncope unclear but given he was in the shower occurs after standing up orthostatic possibly related to his blood pressure medication which she reports has been causing intermittent highs and lows in blood pressure etc. is considered.  Given his history of syncope and is being a high risk episode consult with the hospitalist who is excepted for admission and further workup.  Patient pain much improved after treatment in ED  Patient's presentation is most consistent with acute complicated illness / injury requiring diagnostic workup.          FINAL CLINICAL IMPRESSION(S) / ED DIAGNOSES   Final diagnoses:  Syncope and collapse     Rx / DC Orders   ED Discharge Orders     None        Note:  This document was prepared using Dragon voice recognition software and may include unintentional dictation errors.   Sharyn Creamer, MD 02/26/24 1740

## 2024-02-25 NOTE — Assessment & Plan Note (Signed)
 Managing with vascular, hematology and long term anticoagulation, sx in leg improving

## 2024-02-25 NOTE — Assessment & Plan Note (Signed)
Managed by pulm 

## 2024-02-25 NOTE — Telephone Encounter (Signed)
 Does he need appt  or need to call his cardiologist?

## 2024-02-25 NOTE — Assessment & Plan Note (Signed)
 Per cardiology

## 2024-02-25 NOTE — Assessment & Plan Note (Signed)
 BP controlled today Continue same meds

## 2024-02-25 NOTE — Telephone Encounter (Signed)
 Called patient back, sounded like in pain told him really wanted him to go to ER to get checked out and if he wanted me to call EMS I would, especially since he fell and hit head and on plavix.  He states he would go and call EMS himself.

## 2024-02-25 NOTE — Telephone Encounter (Signed)
 Patient called in to report that he fainted getting up out of his shower chair this morning. This RN attempted to begin triage and patient became very aggressive and aggravated. Patient requested to speak with someone directly in the office. This RN warm transferred patient to CAL.   Copied from CRM [id number]. Topic: Clinical - Red Word Triage >> Feb 25, 2024 11:03 AM Patsy Lager T wrote: Red Word that prompted transfer to Nurse Triage: patient called stated he got dizzy in the tub, passed out. Blood pressure was 98/63 Reason for Disposition  [1] Caller requesting NON-URGENT health information AND [2] PCP's office is the best resource  Protocols used: Information Only Call - No Triage-A-AH

## 2024-02-25 NOTE — Telephone Encounter (Signed)
 Pt requesting a return call. He fell this morning in the shower and hit his head and stomach. There is a small knot on his head and brusing. When he first checked his bp it was 89/63 but after getting agitated with the call center it jumped to 162/something (per patient). He does have issues with his bp fluctuating. He would like to speak with a nurse.

## 2024-02-25 NOTE — Assessment & Plan Note (Signed)
 Est with psych and better on higher zoloft dose

## 2024-02-25 NOTE — Assessment & Plan Note (Signed)
 Managed by nephrology and reestablishing with rheumatology

## 2024-02-25 NOTE — Assessment & Plan Note (Signed)
 Meds refilled.

## 2024-02-25 NOTE — Assessment & Plan Note (Signed)
 Needs to reestablish with endocrinology

## 2024-02-25 NOTE — ED Triage Notes (Signed)
 Pt via POV from home. Pt states he had a syncopal episode while in the shower. Pt states that he has been having fluctuations in his BP has had recent changes to his BP medications. Reports R ring finger pain, L lower abd pain, and headache. States he did hit his head. Pt does take blood thinners. Pt is A&OX4 and NAD

## 2024-02-25 NOTE — Assessment & Plan Note (Signed)
 Long term anticoagulation

## 2024-02-26 ENCOUNTER — Observation Stay

## 2024-02-26 DIAGNOSIS — G459 Transient cerebral ischemic attack, unspecified: Secondary | ICD-10-CM | POA: Diagnosis not present

## 2024-02-26 DIAGNOSIS — R29818 Other symptoms and signs involving the nervous system: Secondary | ICD-10-CM | POA: Diagnosis not present

## 2024-02-26 DIAGNOSIS — I252 Old myocardial infarction: Secondary | ICD-10-CM | POA: Diagnosis not present

## 2024-02-26 DIAGNOSIS — G959 Disease of spinal cord, unspecified: Secondary | ICD-10-CM | POA: Diagnosis not present

## 2024-02-26 DIAGNOSIS — E785 Hyperlipidemia, unspecified: Secondary | ICD-10-CM | POA: Diagnosis not present

## 2024-02-26 DIAGNOSIS — I951 Orthostatic hypotension: Secondary | ICD-10-CM | POA: Diagnosis not present

## 2024-02-26 DIAGNOSIS — R55 Syncope and collapse: Secondary | ICD-10-CM | POA: Diagnosis not present

## 2024-02-26 DIAGNOSIS — I10 Essential (primary) hypertension: Secondary | ICD-10-CM

## 2024-02-26 DIAGNOSIS — G629 Polyneuropathy, unspecified: Secondary | ICD-10-CM

## 2024-02-26 DIAGNOSIS — R42 Dizziness and giddiness: Secondary | ICD-10-CM

## 2024-02-26 DIAGNOSIS — I6523 Occlusion and stenosis of bilateral carotid arteries: Secondary | ICD-10-CM | POA: Diagnosis not present

## 2024-02-26 DIAGNOSIS — F32A Depression, unspecified: Secondary | ICD-10-CM

## 2024-02-26 DIAGNOSIS — I6622 Occlusion and stenosis of left posterior cerebral artery: Secondary | ICD-10-CM | POA: Diagnosis not present

## 2024-02-26 DIAGNOSIS — M4802 Spinal stenosis, cervical region: Secondary | ICD-10-CM | POA: Diagnosis not present

## 2024-02-26 LAB — BASIC METABOLIC PANEL
Anion gap: 10 (ref 5–15)
BUN: 28 mg/dL — ABNORMAL HIGH (ref 8–23)
CO2: 18 mmol/L — ABNORMAL LOW (ref 22–32)
Calcium: 9 mg/dL (ref 8.9–10.3)
Chloride: 109 mmol/L (ref 98–111)
Creatinine, Ser: 1.61 mg/dL — ABNORMAL HIGH (ref 0.61–1.24)
GFR, Estimated: 48 mL/min — ABNORMAL LOW (ref >60)
Glucose, Bld: 145 mg/dL — ABNORMAL HIGH (ref 70–99)
Potassium: 3.5 mmol/L (ref 3.5–5.1)
Sodium: 137 mmol/L (ref 135–145)

## 2024-02-26 LAB — CBC
HCT: 35 % — ABNORMAL LOW (ref 39.0–52.0)
Hemoglobin: 11.3 g/dL — ABNORMAL LOW (ref 13.0–17.0)
MCH: 28.5 pg (ref 26.0–34.0)
MCHC: 32.3 g/dL (ref 30.0–36.0)
MCV: 88.2 fL (ref 80.0–100.0)
Platelets: 158 10*3/uL (ref 150–400)
RBC: 3.97 MIL/uL — ABNORMAL LOW (ref 4.22–5.81)
RDW: 15.4 % (ref 11.5–15.5)
WBC: 3.4 10*3/uL — ABNORMAL LOW (ref 4.0–10.5)
nRBC: 0 % (ref 0.0–0.2)

## 2024-02-26 LAB — GLUCOSE, CAPILLARY: Glucose-Capillary: 136 mg/dL — ABNORMAL HIGH (ref 70–99)

## 2024-02-26 MED ORDER — ONDANSETRON HCL 4 MG PO TABS: 4.0000 mg | ORAL_TABLET | Freq: Four times a day (QID) | ORAL | Status: DC | PRN

## 2024-02-26 MED ORDER — ONDANSETRON HCL 4 MG/2ML IJ SOLN: 4.0000 mg | Freq: Four times a day (QID) | INTRAMUSCULAR | Status: DC | PRN

## 2024-02-26 MED ORDER — OFLOXACIN 0.3 % OP SOLN
2.0000 [drp] | OPHTHALMIC | Status: DC
Start: 2024-02-26 — End: 2024-02-26

## 2024-02-26 MED ORDER — NYSTATIN 100000 UNIT/GM EX POWD: 1.0000 | Freq: Three times a day (TID) | CUTANEOUS | Status: DC

## 2024-02-26 MED ORDER — MAGNESIUM HYDROXIDE 400 MG/5ML PO SUSP: 30.0000 mL | Freq: Every day | ORAL | Status: DC | PRN

## 2024-02-26 MED ORDER — TRAZODONE HCL 50 MG PO TABS
25.0000 mg | ORAL_TABLET | Freq: Every evening | ORAL | Status: DC | PRN
  Filled 2024-02-26: qty 1

## 2024-02-26 MED ORDER — SERTRALINE HCL 50 MG PO TABS
150.0000 mg | ORAL_TABLET | Freq: Every day | ORAL | Status: DC
  Administered 2024-02-26 – 2024-02-27 (×2): 150 mg via ORAL
  Filled 2024-02-26 (×2): qty 3

## 2024-02-26 MED ORDER — MYCOPHENOLATE MOFETIL 250 MG PO CAPS
500.0000 mg | ORAL_CAPSULE | Freq: Two times a day (BID) | ORAL | Status: DC
  Administered 2024-02-26 – 2024-02-27 (×3): 500 mg via ORAL
  Filled 2024-02-26 (×4): qty 2

## 2024-02-26 MED ORDER — CLONAZEPAM 0.5 MG PO TABS
0.5000 mg | ORAL_TABLET | Freq: Every day | ORAL | Status: DC
  Administered 2024-02-26 – 2024-02-27 (×2): 0.5 mg via ORAL
  Filled 2024-02-26 (×2): qty 1

## 2024-02-26 MED ORDER — ARIPIPRAZOLE 2 MG PO TABS
2.0000 mg | ORAL_TABLET | Freq: Every day | ORAL | Status: DC
  Administered 2024-02-26: 2 mg via ORAL
  Filled 2024-02-26 (×2): qty 1

## 2024-02-26 MED ORDER — ALBUTEROL SULFATE HFA 108 (90 BASE) MCG/ACT IN AERS: 2.0000 | INHALATION_SPRAY | Freq: Four times a day (QID) | RESPIRATORY_TRACT | Status: DC | PRN

## 2024-02-26 MED ORDER — CLOPIDOGREL BISULFATE 75 MG PO TABS
75.0000 mg | ORAL_TABLET | Freq: Every day | ORAL | Status: DC
  Administered 2024-02-26 – 2024-02-27 (×2): 75 mg via ORAL
  Filled 2024-02-26 (×2): qty 1

## 2024-02-26 MED ORDER — MECLIZINE HCL 25 MG PO TABS
12.5000 mg | ORAL_TABLET | Freq: Three times a day (TID) | ORAL | Status: DC | PRN
  Filled 2024-02-26: qty 0.5

## 2024-02-26 MED ORDER — CROMOLYN SODIUM 4 % OP SOLN: 1.0000 [drp] | Freq: Four times a day (QID) | OPHTHALMIC | Status: DC | PRN

## 2024-02-26 MED ORDER — LOSARTAN POTASSIUM 50 MG PO TABS
50.0000 mg | ORAL_TABLET | Freq: Every day | ORAL | Status: DC
  Administered 2024-02-26 – 2024-02-27 (×2): 50 mg via ORAL
  Filled 2024-02-26 (×2): qty 1

## 2024-02-26 MED ORDER — ACETAMINOPHEN 325 MG PO TABS
650.0000 mg | ORAL_TABLET | Freq: Four times a day (QID) | ORAL | Status: DC | PRN
  Administered 2024-02-26: 650 mg via ORAL
  Filled 2024-02-26: qty 2

## 2024-02-26 MED ORDER — METHOCARBAMOL 500 MG PO TABS
500.0000 mg | ORAL_TABLET | Freq: Three times a day (TID) | ORAL | Status: DC | PRN
  Administered 2024-02-26 (×2): 500 mg via ORAL
  Filled 2024-02-26 (×3): qty 1

## 2024-02-26 MED ORDER — ACETAMINOPHEN 650 MG RE SUPP: 650.0000 mg | Freq: Four times a day (QID) | RECTAL | Status: DC | PRN

## 2024-02-26 MED ORDER — VITAMIN D 25 MCG (1000 UNIT) PO TABS
1000.0000 [IU] | ORAL_TABLET | Freq: Every day | ORAL | Status: DC
  Administered 2024-02-26 – 2024-02-27 (×2): 1000 [IU] via ORAL
  Filled 2024-02-26 (×2): qty 1

## 2024-02-26 MED ORDER — SENNA 8.6 MG PO TABS: 2.0000 | ORAL_TABLET | Freq: Every day | ORAL | Status: DC

## 2024-02-26 MED ORDER — ALBUTEROL SULFATE (2.5 MG/3ML) 0.083% IN NEBU
2.5000 mg | INHALATION_SOLUTION | Freq: Four times a day (QID) | RESPIRATORY_TRACT | Status: DC | PRN
Start: 2024-02-26 — End: 2024-02-26

## 2024-02-26 MED ORDER — SODIUM CHLORIDE 0.9 % IV SOLN: INTRAVENOUS | Status: DC

## 2024-02-26 MED ORDER — TAMSULOSIN HCL 0.4 MG PO CAPS
0.4000 mg | ORAL_CAPSULE | Freq: Every day | ORAL | Status: DC
Start: 2024-02-26 — End: 2024-02-26

## 2024-02-26 MED ORDER — SODIUM CHLORIDE 0.9% FLUSH
3.0000 mL | Freq: Two times a day (BID) | INTRAVENOUS | Status: DC
  Administered 2024-02-26 – 2024-02-27 (×2): 3 mL via INTRAVENOUS

## 2024-02-26 MED ORDER — METOPROLOL SUCCINATE ER 25 MG PO TB24
25.0000 mg | ORAL_TABLET | Freq: Every day | ORAL | Status: DC
Start: 2024-02-26 — End: 2024-02-27
  Administered 2024-02-26 – 2024-02-27 (×2): 25 mg via ORAL
  Filled 2024-02-26 (×2): qty 1

## 2024-02-26 MED ORDER — CLOBETASOL PROPIONATE 0.05 % EX SOLN: Freq: Two times a day (BID) | CUTANEOUS | Status: DC

## 2024-02-26 MED ORDER — GABAPENTIN 100 MG PO CAPS
200.0000 mg | ORAL_CAPSULE | Freq: Three times a day (TID) | ORAL | Status: DC
  Administered 2024-02-26 – 2024-02-27 (×4): 200 mg via ORAL
  Filled 2024-02-26 (×4): qty 2

## 2024-02-26 MED ORDER — HYDROXYZINE HCL 10 MG PO TABS: 10.0000 mg | ORAL_TABLET | Freq: Three times a day (TID) | ORAL | Status: DC | PRN

## 2024-02-26 MED ORDER — ADULT MULTIVITAMIN W/MINERALS CH
1.0000 | ORAL_TABLET | Freq: Every day | ORAL | Status: DC
  Administered 2024-02-26 – 2024-02-27 (×2): 1 via ORAL
  Filled 2024-02-26 (×2): qty 1

## 2024-02-26 MED ORDER — ROSUVASTATIN CALCIUM 20 MG PO TABS
20.0000 mg | ORAL_TABLET | Freq: Every day | ORAL | Status: DC
  Administered 2024-02-26 – 2024-02-27 (×2): 20 mg via ORAL
  Filled 2024-02-26 (×2): qty 1

## 2024-02-26 MED ORDER — APIXABAN 5 MG PO TABS
5.0000 mg | ORAL_TABLET | Freq: Two times a day (BID) | ORAL | Status: DC
  Administered 2024-02-26 – 2024-02-27 (×3): 5 mg via ORAL
  Filled 2024-02-26 (×3): qty 1

## 2024-02-26 MED ORDER — HYDRALAZINE HCL 50 MG PO TABS
25.0000 mg | ORAL_TABLET | Freq: Three times a day (TID) | ORAL | Status: DC
  Administered 2024-02-26 – 2024-02-27 (×3): 25 mg via ORAL
  Filled 2024-02-26 (×3): qty 1

## 2024-02-26 MED ORDER — MONTELUKAST SODIUM 10 MG PO TABS
10.0000 mg | ORAL_TABLET | Freq: Every day | ORAL | Status: DC
  Administered 2024-02-26: 10 mg via ORAL
  Filled 2024-02-26: qty 1

## 2024-02-26 MED ORDER — LORATADINE 10 MG PO TABS
10.0000 mg | ORAL_TABLET | Freq: Every evening | ORAL | Status: DC
  Administered 2024-02-26: 10 mg via ORAL
  Filled 2024-02-26: qty 1

## 2024-02-26 NOTE — Assessment & Plan Note (Signed)
-   Will obtain a brain MRI without contrast to assess for VBI. - We will place him on as needed Antivert.

## 2024-02-26 NOTE — Consult Note (Signed)
 Cardiology Consultation   Patient ID: Jonathon Snow MRN: [id number]; DOB: 19-Aug-1961  Admit date: 02/25/2024 Date of Consult: 02/26/2024  PCP:  Danelle Berry, PA-C   [facility] Providers Cardiologist:  Yvonne Kendall, MD        Patient Profile:   Jonathon Snow is a 63 y.o. male with a hx of multivessel CAD s/p PCI to the distal LCx in the setting of inferior STEMI 10/2023 with syncope and T12 chance fx, chronic HFpEF, hypertension, hyperlipidemia, DVT/PE, venous sinus thrombosis, lupus, COPD, and chronic kidney disease IIIb who is being seen 02/26/2024 for the evaluation of syncope at the request of Dr. Georgeann Oppenheim.  History of Present Illness:   Mr. Rains was first evaluated by Dr. Mancil Pfenning Milling on 02/2021 for palpitations shortness of breath showed EF 55 to 60%, no G2 DD, normal RV, no significant valvular abnormalities.  Stress test showed low risk study with no evidence of ischemia.  Very artery calcifications in the LAD and left circumflex.  Patient presented to the ED via EMS 11/05/2023 after syncopal episode during which he struck his head.  Found patient to be hypotensive bleeding from his scalp.  EKG showed inferior ST segment elevation prompting activation of code STEMI.  He denied chest pain but complained of mid back pain and increased shortness of breath with diaphoresis.  Head CT and cervical spine CT prior to being taken to the Cath Lab.  Emergently underwent PCI with DES to distal left circumflex.  Patient had recurrent lightheadedness with bradycardia suspicious for vasovagal episode.  Improved with saline bolus however complained of left lower abdominal pain and noted to have new bruising along left lower quadrant/left flank.  CT abdomen pelvis revealed T10 fracture. Neurology consulted and felt fracture was highly unstable and could lead to paralysis requiring urgent surgical stabilization. He underwent ORIF of an fracture and posterior lateral arthrodesis T7-T12.  Patient was  discharged 11/12/2023. Seen in clinic for follow-up 12/21/2023.  At that time he was having continued dizziness with positional changes was on midodrine 3 times daily with blood pressure stable.  Also having ongoing central chest tightness with exertion.  Midodrine was increased to 10 mg BID as needed.  Seen again in follow-up 01/20/2024 by Dr. Okey Dupre that time he was not taking his midodrine due to blood pressures being elevated in the 120s to 130s. However he was having ongoing dizziness.  He did have recurrent DVT on low-dose apixaban.  With ongoing chest discomfort and elevated blood pressure metoprolol was increased to 25 mg daily.  He was continued on midodrine as needed for orthostatic hypotension.  He was most recently seen 02/10/2024 for routine follow-up with Dr. Okey Dupre.  At that time he was having ongoing symptoms of vertigo but denied any falls, lightheadedness, and syncope.  He had also seen multiple providers since his previous visit and was still hypertensive prompting addition of losartan and hydralazine to his regimen.  BP this visit 154/90.  Midodrine was discontinued and losartan was increased to 50 mg daily with continuation of hydralazine.  Patient reports that since previous hospitalization in November he has been experiencing recurrent lightheadedness and episodes of vertigo. Both of these are associated with positional changes. He also endorses occasional tremor, palpitations, and chest discomfort. Yesterday 3/6 the patient was taking a shower and when he stood up from his shower chair he became lightheaded and passed out. He lost consciousness and did hit his head and left flank on the tub. When he woke up  he called EMS.  In the ED, BP 178/98 with otherwise normal vital signs.  BMP with BUN of 26 and creatinine 1.38.  CBC with WBC of 3.8. Troponin negative EKG showed sinus rhythm with no ischemic changes. CT abdomen pelvis CT head, chest, and A/p without acute findings. Patient was given IV  fentanyl and admitted.  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   Overview:  superior sagittal sinus, left transverse sinus and cortical veins    Closed left hip fracture, initial encounter (HCC) 02/18/2023   COVID-19 virus infection 12/2020   Depression    DVT (deep venous thrombosis) (HCC)    Dyspnea    GERD (gastroesophageal reflux disease)    Head injury 11/05/2023   Heart attack (HCC) 11/05/2023   Heel spur, left 02/16/2019   Heel spur, right 02/16/2019   Hematoma of groin 11/08/2023   Herpes zoster infection of lumbar region 02/20/2020   History of DVT (deep vein thrombosis) 03/22/2020   History of kidney stones    Hyperlipidemia    Hypertension    Long term current use of systemic steroids 11/08/2020   Lupus    Lymphedema 10/07/2018   Morbid obesity (HCC)    Opiate abuse, episodic (HCC) 02/26/2018   Osteoporosis    Pain and swelling of right lower extremity 02/03/2023   Pneumonia    PONV (postoperative nausea and vomiting)    Postphlebitic syndrome with ulcer, 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   Presence of IVC filter 03/22/2020   Removed   Pulmonary embolism (HCC)    Renal disorder    Stage III   Severe episode of recurrent major depressive disorder, 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 ENDOSCOPY;  Service: Endoscopy;  Laterality: N/A;   COLONOSCOPY WITH PROPOFOL N/A 07/01/2023   Procedure: COLONOSCOPY WITH PROPOFOL;  Surgeon: Wyline Mood, MD;  Location: [facility] ENDOSCOPY;  Service: Gastroenterology;  Laterality: N/A;   CORONARY ULTRASOUND/IVUS N/A 11/05/2023   Procedure: Coronary Ultrasound/IVUS;  Surgeon: Yvonne Kendall, MD;  Location: [facility] INVASIVE CV LAB;  Service: Cardiovascular;  Laterality: N/A;   CORONARY/GRAFT ACUTE MI REVASCULARIZATION N/A 11/05/2023   Procedure: Coronary/Graft Acute MI Revascularization;  Surgeon: Yvonne Kendall, MD;  Location: [facility] INVASIVE CV LAB;  Service: Cardiovascular;  Laterality: N/A;   CYST EXCISION  92 or 93    Liver cyst removal [facility]   FLEXIBLE BRONCHOSCOPY N/A 11/01/2021   Procedure: FLEXIBLE BRONCHOSCOPY;  Surgeon: Vida Rigger, MD;  Location: [facility] ORS;  Service: Thoracic;  Laterality: N/A;   HIP PINNING,CANNULATED Left 02/19/2023   Procedure: PERCUTANEOUS FIXATION OF FEMORAL NECK;  Surgeon: Signa Kell, MD;  Location: [facility] ORS;  Service: Orthopedics;  Laterality: Left;   I & D EXTREMITY Right 04/29/2017   Procedure: IRRIGATION AND DEBRIDEMENT EXTREMITY;  Surgeon: Ricarda Frame, MD;  Location: [facility] ORS;  Service: General;  Laterality: Right;   IRRIGATION AND DEBRIDEMENT ABSCESS Left 04/29/2017   Procedure: IRRIGATION AND DEBRIDEMENT Scrotal ABSCESS;  Surgeon: Ricarda Frame, MD;  Location: [facility] ORS;  Service: General;  Laterality: Left;   LEFT HEART CATH AND CORONARY ANGIOGRAPHY N/A 11/05/2023   Procedure: LEFT HEART CATH AND CORONARY ANGIOGRAPHY;  Surgeon: Yvonne Kendall, MD;  Location: [facility] INVASIVE CV LAB;  Service: Cardiovascular;  Laterality: N/A;   POLYPECTOMY  07/01/2023   Procedure: POLYPECTOMY;  Surgeon: Wyline Mood, MD;  Location: [facility] ENDOSCOPY;  Service: Gastroenterology;;       Inpatient Medications: Scheduled Meds:  apixaban  5 mg Oral BID   ARIPiprazole  2 mg Oral Daily   cholecalciferol   1,000 Units Oral Daily   clonazePAM  0.5 mg Oral Daily   clopidogrel  75 mg Oral Daily   gabapentin  200 mg Oral TID   hydrALAZINE  25 mg Oral TID   loratadine  10 mg Oral QPM   losartan  50 mg Oral Daily   metoprolol succinate  25 mg Oral Daily   montelukast  10 mg Oral QHS   multivitamin with minerals  1 tablet Oral Daily   mycophenolate  500 mg Oral BID   rosuvastatin  20 mg Oral Daily   sertraline  150 mg Oral Daily   sodium chloride flush  3 mL Intravenous Q12H   Continuous Infusions:  sodium chloride 100 mL/hr at 02/26/24 0641   PRN Meds: acetaminophen **OR** acetaminophen, hydrOXYzine, magnesium hydroxide, meclizine, methocarbamol, ondansetron **OR** ondansetron (ZOFRAN) IV, traZODone  Allergies:    Allergies  Allergen Reactions   Enalapril Other (See Comments)    Unknown reaction   Vicodin [Hydrocodone-Acetaminophen] Hives and Rash    Severe headaches (also) NAME BRAND ONLY PER PT CAN TAKE GENERIC    Social History:   Social History   Socioeconomic History   Marital status: Divorced    Spouse name: Not on file   Number of children: Not on file   Years of education: Not on file   Highest education level: Not on file  Occupational History   Not on file  Tobacco Use   Smoking status: Never   Smokeless tobacco: Never  Vaping Use   Vaping status: Never Used  Substance and Sexual Activity   Alcohol use: No   Drug use: No    Comment: PT DENIES   Sexual activity: Yes  Other Topics Concern   Not on file  Social History Narrative   Not on file   Social Drivers of Health   Financial Resource Strain: Not on file  Food Insecurity: No Food Insecurity (02/26/2024)   Hunger Vital Sign    Worried About Running Out of Food in the Last Year: Never true    Ran Out of Food in the Last Year: Never true  Transportation Needs: No Transportation Needs (02/26/2024)   PRAPARE - Administrator, Civil Service (Medical): No    Lack of Transportation (Non-Medical):  No  Physical Activity: Not on file  Stress: Not on file  Social Connections: Unknown (04/25/2022)   [facility]   Social Network    Social Network: Not on file  Intimate Partner Violence: Not At Risk (02/26/2024)   Humiliation, Afraid, Rape, and Kick questionnaire    Fear of Current or Ex-Partner: No    Emotionally Abused: No    Physically Abused: No    Sexually Abused: No    Family History:    Family History  Problem Relation Age of Onset   Hypertension Father    Heart disease Father    Clotting disorder Mother    Kidney disease Brother    Heart attack Maternal Grandmother    Heart attack Maternal Grandfather    Heart attack Paternal Grandfather      ROS:  Please see the history of present illness.   Physical Exam/Data:   Vitals:   02/26/24 0130 02/26/24 0318 02/26/24 0801 02/26/24 0944  BP: 131/65 137/78 126/75 (!) 151/85  Pulse: 81 84 85   Resp: 16 18 18    Temp:  98.1 F (36.7 C) (!) 97.4 F (36.3 C)   TempSrc:      SpO2: 95% 100% 98%   Weight:  99.5 kg    Height:  6\' 2"  (1.88 m)      Intake/Output Summary (Last 24 hours) at 02/26/2024 1150 Last data filed at 02/26/2024 1121 Gross per 24 hour  Intake 962.22 ml  Output --  Net 962.22 ml      02/26/2024    3:18 AM 02/25/2024    3:57 PM 02/16/2024    1:10 PM  Last 3 Weights  Weight (lbs) 219 lb 5.7 oz 225 lb 223 lb  Weight (kg) 99.5 kg 102.059 kg 101.152 kg     Body mass index is 28.16 kg/m.  General:  Well nourished, well developed, in no acute distress HEENT: normal Neck: no JVD Cardiac:  normal S1, S2; RRR; no murmur  Lungs:  clear to auscultation bilaterally, no wheezing, rhonchi or rales  Abd: soft, nontender, no hepatomegaly  Ext: no edema Skin: warm and dry  Psych:  Normal affect   EKG:  The EKG was personally reviewed and demonstrates:  Sinus rhythm, rate 98 bpm, with inferior T wave inversions, anterior and inferior infarct  Relevant CV Studies:  11/06/2023 Echo  complete 1. Left ventricular ejection fraction, by estimation, is 55 to 60%. The  left ventricle has normal function. The left ventricle has no regional  wall motion abnormalities. There is mild left ventricular hypertrophy.  Left ventricular diastolic parameters  are consistent with Grade I diastolic dysfunction (impaired relaxation).   2. Right ventricular systolic function is normal. The right ventricular  size is mildly enlarged. There is mildly elevated pulmonary artery  systolic pressure.   3. The mitral valve is normal in structure. No evidence of mitral valve  regurgitation.   4. The aortic valve is grossly normal. Aortic valve regurgitation is not  visualized.   11/05/2023 LHC Severe multivessel coronary artery disease, as detailed above.  Culprit lesion for inferior STEMI appears to be hazy 80-90% distal LCx stenosis.  There is also occlusion of the proximal RCA with bridging and left-to-right collaterals that could not be crossed with a wire suggesting chronic nature.  Moderate to severe but noncritical proximal/mid LAD and proximal ramus disease also present. Normal left ventricular filling pressure (LVEDP 14 mmHg). Successful IVUS-guided PCI to hazy 80-90% distal LCx stenosis using Onyx Frontier 3.0 x 30 mm drug-eluting stent with 0% residual stenosis and TIMI-3 flow.  First medical contact to balloon time was delayed because of the patient's syncope and head trauma necessitating CT head and cervical spine in the ER prior to transport to the cath lab as well as multiple potential culprit lesions.   Laboratory Data:  High Sensitivity Troponin:   Recent Labs  Lab 02/25/24 1608 02/25/24 1924  TROPONINIHS 6 7     Chemistry Recent Labs  Lab 02/25/24 1608 02/26/24 0501  NA 137 137  K 4.0 3.5  CL 108 109  CO2 19* 18*  GLUCOSE 107* 145*  BUN 26* 28*  CREATININE 1.38* 1.61*  CALCIUM 9.0 9.0  GFRNONAA 58* 48*  ANIONGAP 10 10    No results for input(s): "PROT",  "ALBUMIN", "AST", "ALT", "ALKPHOS", "BILITOT" in the last 168 hours. Lipids No results for input(s): "CHOL", "TRIG", "HDL", "LABVLDL", "LDLCALC", "CHOLHDL" in the last 168 hours.  Hematology Recent Labs  Lab 02/25/24 1608 02/26/24 0501  WBC 3.8* 3.4*  RBC 4.49 3.97*  HGB 13.0 11.3*  HCT 40.3 35.0*  MCV 89.8 88.2  MCH 29.0 28.5  MCHC 32.3 32.3  RDW 15.3 15.4  PLT 180 158   Thyroid No results for input(s): "TSH", "FREET4" in the last 168 hours.  BNPNo results for input(s): "BNP", "PROBNP" in the last 168 hours.  DDimer No results for input(s): "DDIMER" in the last 168 hours.   Radiology/Studies:  MR BRAIN WO CONTRAST Result Date: 02/26/2024 IMPRESSION: 1. No acute intracranial abnormality. 2. Normal for age noncontrast MRI appearance of the Brain aside from solitary chronic microhemorrhage in the left corona radiata. 3. Chronically nodular, heterogeneous appearance of the bilateral parotid and salivary glands, suggesting chronic/recurrent salivary inflammation such as due to Sjogren syndrome or other systemic inflammatory process. Electronically Signed   By: Odessa Fleming M.D.   On: 02/26/2024 04:32   DG Hand Complete Right Result Date: 02/25/2024 IMPRESSION: No acute osseous abnormality. Chronic appearing fifth metacarpal fracture Electronically Signed   By: Jasmine Pang M.D.   On: 02/25/2024 23:12   CT ABDOMEN PELVIS W CONTRAST Result Date: 02/25/2024 IMPRESSION: 1. No CT evidence for acute intra-abdominal or pelvic abnormality. 2. Soft tissue stranding within the left lower quadrant anterior abdominal wall soft tissues consistent with a contusion 3. Multiple chronic findings as discussed above Electronically Signed   By: Jasmine Pang M.D.   On: 02/25/2024 23:11   CT Angio Chest PE W and/or Wo Contrast Result Date: 02/25/2024 IMPRESSION: 1. Negative for acute pulmonary embolism or aortic dissection 2. Clear lung fields Electronically Signed   By: Jasmine Pang M.D.   On: 02/25/2024 23:01    CT Cervical Spine Wo Contrast Result Date: 02/25/2024 IMPRESSION: Degenerative changes.  No acute osseous abnormality. Electronically Signed   By: Jasmine Pang M.D.   On: 02/25/2024 22:54   CT HEAD WO CONTRAST Result Date: 02/25/2024 IMPRESSION: No acute intracranial abnormality. Electronically Signed   By: Aram Candela M.D.   On: 02/25/2024 19:14    Assessment and Plan:   Syncope Orthostatic hypotension Vertigo - Previously hospitalized 10/2023 after syncopal episode with STEMI and unstable spinal fx - Presented 3/5 after syncopal episode at home in the shower with intermittent positional lightheadedness and vertigo on going for several months - Echo 10/2023 with LVEF 55-60% with mild LVH, G1DD, no valvular abnormalities - Unclear etiology of syncope although likely component of autonomic dysfunction given labile BP - Orthostatics done overnight positive with drop in BP to 77/57 after 3 minutes standing - Continue orthostatics twice daily - May benefit from outpatient evaluation by dysautonomia EP - MRI brain done this hospitalization detailed above - Neurology consulted, appreciate input  CAD s/p PCI 10/2023 - LHC 10/2023 with PCI to the distal Lcx - Reports intermittent chest discomfort - Denies chest pain at this time - EKG without ischemic changes - Troponin negative - Continue apixaban, clopidogrel, rosuvastatin, and metoprolol succinate - No plan for further ischemic evaluation at this time  Hypertension - BP labile - Continue PTA hydralazine, losartan, and metoprolol  For questions or updates, please contact [facility] Please consult [email or web address] for contact info under    Signed, Orion Crook, PA-C  02/26/2024 11:50 AM

## 2024-02-26 NOTE — H&P (Addendum)
 Jonathon Snow   PATIENT NAME: Jonathon Snow    MR#:  [id number]  DATE OF BIRTH:  Jonathon Snow 06, 1962  DATE OF ADMISSION:  02/25/2024  PRIMARY CARE PHYSICIAN: Jonathon Berry, PA-C   Patient is coming from: Home  REQUESTING/REFERRING PHYSICIAN: Sharyn Creamer, MD  CHIEF COMPLAINT:   Chief Complaint  Patient presents with   Loss of Consciousness    HISTORY OF PRESENT ILLNESS:  WOODROE Snow is a 63 y.o. Caucasian male with medical history significant for GERD, depression, DVT, hypertension, dyslipidemia, PE and coronary artery disease, who presented to the emergency room with acute onset of syncope.  He has been experiencing near syncope over the last few weeks.  Lightheadedness and dizziness.  Before he came to the ER and while getting out of the shower he had a syncopal episode and hit the edge of the tub with his left side.  He had subsequent left lower chest pain and right fourth finger contusion.  He comes in denied any paresthesias or focal muscle weakness.  No chest pain or palpitations.  He admits to recent mild dyspnea without significant cough or wheezing.  He admits to chills without measured fever.  He has been having vertigo.  No nausea or vomiting or abdominal pain.  ED Course: When the patient came to the ER, BP was 178/98 with otherwise normal vital signs.  Labs revealed a BUN of 26 with creatinine 1.38.  High sensitive troponin I was 6 and later 7.  CBC showed WBC of 3.8.  UA was unremarkable. EKG as reviewed by me :  EKG showed normal sinus rhythm with a rate of 97 with minimal voltage criteria for LVH and T wave inversion inferiorly. Imaging: Noncontrasted head CT scan revealed no acute intracranial abnormalities.  Chest CTA revealed no evidence for PE or aortic dissection and clear lung fields.  C-spine CT showed the active changes with no acute osseous abnormality.  Abdominal pelvic CT scan with contrast revealed the following: 1. No CT evidence for acute intra-abdominal or  pelvic abnormality. 2. Soft tissue stranding within the left lower quadrant anterior abdominal wall soft tissues consistent with a contusion 3. Multiple chronic findings as discussed above. Right hand x-ray revealed chronic appearing fifth metacarpal fracture with no acute osseous abnormality.  The patient was given 1 p.o. prep 775 mcg of IV fentanyl.  He will be admitted to a medical telemetry division bed for further evaluation and management. 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   Overview:  superior sagittal sinus, left transverse sinus and cortical veins    Closed left hip fracture, initial encounter (HCC) 02/18/2023   COVID-19 virus infection 12/2020   Depression    DVT (deep venous thrombosis) (HCC)    Dyspnea    GERD (gastroesophageal reflux disease)    Head injury 11/05/2023   Heart attack (HCC) 11/05/2023   Heel spur, left 02/16/2019   Heel spur, right 02/16/2019   Hematoma of groin 11/08/2023   Herpes zoster infection of lumbar region 02/20/2020   History of DVT (deep vein thrombosis) 03/22/2020   History of kidney stones    Hyperlipidemia    Hypertension    Long term current use of systemic steroids 11/08/2020   Lupus  Lymphedema 10/07/2018   Morbid obesity (HCC)    Opiate abuse, episodic (HCC) 02/26/2018   Osteoporosis    Pain and swelling of right lower extremity 02/03/2023   Pneumonia    PONV (postoperative nausea and vomiting)    Postphlebitic syndrome with ulcer, 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   Presence of IVC filter 03/22/2020   Removed   Pulmonary embolism (HCC)    Renal disorder    Stage III   Severe episode of recurrent major depressive disorder, 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   Shock  circulatory (HCC) 11/06/2023   STEMI (ST elevation myocardial infarction) (HCC) 11/05/2023    PAST SURGICAL HISTORY:   Past Surgical History:  Procedure Laterality Date   ANKLE SURGERY Right    BACK SURGERY     BRONCHIAL WASHINGS N/A 11/01/2021   Procedure: BRONCHIAL WASHINGS;  Surgeon: Vida Rigger, MD;  Location: [facility] ORS;  Service: Thoracic;  Laterality: N/A;   COLONOSCOPY WITH PROPOFOL N/A 05/28/2020   Procedure: COLONOSCOPY WITH PROPOFOL;  Surgeon: Wyline Mood, MD;  Location: [facility] ENDOSCOPY;  Service: Endoscopy;  Laterality: N/A;   COLONOSCOPY WITH PROPOFOL N/A 07/01/2023   Procedure: COLONOSCOPY WITH PROPOFOL;  Surgeon: Wyline Mood, MD;  Location: [facility] ENDOSCOPY;  Service: Gastroenterology;  Laterality: N/A;   CORONARY ULTRASOUND/IVUS N/A 11/05/2023   Procedure: Coronary Ultrasound/IVUS;  Surgeon: Yvonne Kendall, MD;  Location: [facility] INVASIVE CV LAB;  Service: Cardiovascular;  Laterality: N/A;   CORONARY/GRAFT ACUTE MI REVASCULARIZATION N/A 11/05/2023   Procedure: Coronary/Graft Acute MI Revascularization;  Surgeon: Yvonne Kendall, MD;  Location: [facility] INVASIVE CV LAB;  Service: Cardiovascular;  Laterality: N/A;   CYST EXCISION  92 or 93    Liver cyst removal [facility]   FLEXIBLE BRONCHOSCOPY N/A 11/01/2021   Procedure: FLEXIBLE BRONCHOSCOPY;  Surgeon: Vida Rigger, MD;  Location: [facility] ORS;  Service: Thoracic;  Laterality: N/A;   HIP PINNING,CANNULATED Left 02/19/2023   Procedure: PERCUTANEOUS FIXATION OF FEMORAL NECK;  Surgeon: Signa Kell, MD;  Location: [facility] ORS;  Service: Orthopedics;  Laterality: Left;   I & D EXTREMITY Right 04/29/2017   Procedure: IRRIGATION AND DEBRIDEMENT EXTREMITY;  Surgeon: Ricarda Frame, MD;  Location: [facility] ORS;  Service: General;  Laterality: Right;   IRRIGATION AND DEBRIDEMENT ABSCESS Left 04/29/2017   Procedure: IRRIGATION AND DEBRIDEMENT Scrotal ABSCESS;  Surgeon: Ricarda Frame, MD;  Location: [facility] ORS;  Service: General;  Laterality: Left;    LEFT HEART CATH AND CORONARY ANGIOGRAPHY N/A 11/05/2023   Procedure: LEFT HEART CATH AND CORONARY ANGIOGRAPHY;  Surgeon: Yvonne Kendall, MD;  Location: [facility] INVASIVE CV LAB;  Service: Cardiovascular;  Laterality: N/A;   POLYPECTOMY  07/01/2023   Procedure: POLYPECTOMY;  Surgeon: Wyline Mood, MD;  Location: [facility] ENDOSCOPY;  Service: Gastroenterology;;    SOCIAL HISTORY:   Social History   Tobacco Use   Smoking status: Never   Smokeless tobacco: Never  Substance Use Topics   Alcohol use: No    FAMILY HISTORY:   Family History  Problem Relation Age of Onset   Hypertension Father    Heart disease Father    Clotting disorder Mother    Kidney disease Brother    Heart attack Maternal Grandmother    Heart attack Maternal Grandfather    Heart attack Paternal Grandfather     DRUG ALLERGIES:   Allergies  Allergen Reactions   Enalapril Other (See Comments)    Unknown reaction   Vicodin [Hydrocodone-Acetaminophen] Hives and Rash    Severe headaches (also) NAME BRAND ONLY PER PT CAN TAKE GENERIC  REVIEW OF SYSTEMS:   ROS As per history of present illness. All pertinent systems were reviewed above. Constitutional, HEENT, cardiovascular, respiratory, GI, GU, musculoskeletal, neuro, psychiatric, endocrine, integumentary and hematologic systems were reviewed and are otherwise negative/unremarkable except for positive findings mentioned above in the HPI.   MEDICATIONS AT HOME:   Prior to Admission medications   Medication Sig Start Date End Date Taking? Authorizing Provider  apixaban (ELIQUIS) 5 MG TABS tablet Take 1 tablet (5 mg total) by mouth 2 (two) times daily. 02/16/24  Yes Jonathon Berry, PA-C  ARIPiprazole (ABILIFY) 2 MG tablet Take 2 mg by mouth daily. 06/26/23  Yes [provider]  cholecalciferol (VITAMIN D3) 25 MCG (1000 UNIT) tablet Take 1,000 Units by mouth daily.   Yes [provider]  clonazePAM (KLONOPIN) 0.5 MG tablet Take 0.5 mg by mouth daily.  11/01/23  Yes [provider]  clopidogrel (PLAVIX) 75 MG tablet TAKE 1 TABLET(75 MG) BY MOUTH DAILY 02/09/24  Yes Jonathon Berry, PA-C  gabapentin (NEURONTIN) 100 MG capsule Take 2 capsules (200 mg total) by mouth 3 (three) times daily. 12/14/23 03/13/24 Yes Margarita Mail, DO  hydrALAZINE (APRESOLINE) 25 MG tablet Take 1 tablet (25 mg total) by mouth 3 (three) times daily. 02/16/24  Yes Jonathon Berry, PA-C  hydrOXYzine (ATARAX) 10 MG tablet Take 1 tablet (10 mg total) by mouth every 8 (eight) hours as needed for itching. 01/29/24  Yes Jonathon Berry, PA-C  levocetirizine (XYZAL) 5 MG tablet Take 1 tablet (5 mg total) by mouth every evening. 12/08/22  Yes Jonathon Berry, PA-C  losartan (COZAAR) 50 MG tablet Take 1 tablet (50 mg total) by mouth daily. 02/10/24  Yes End, Cristal Deer, MD  methocarbamol (ROBAXIN) 500 MG tablet Take 1 tablet (500 mg total) by mouth every 8 (eight) hours as needed for muscle spasms. 12/14/23  Yes Margarita Mail, DO  metoprolol succinate (TOPROL-XL) 25 MG 24 hr tablet Take 1 tablet (25 mg total) by mouth daily. 01/20/24  Yes End, Cristal Deer, MD  montelukast (SINGULAIR) 10 MG tablet Take 1 tablet (10 mg total) by mouth at bedtime. 12/08/22  Yes Jonathon Berry, PA-C  Multiple Vitamins-Minerals (MULTIVITAMIN WITH MINERALS) tablet Take 1 tablet by mouth daily.   Yes [provider]  mycophenolate (CELLCEPT) 500 MG tablet Take 2 tablets (1,000 mg total) by mouth 2 (two) times daily. HOLD until neurosurgery is ok to restart this (waiting for incision to heal) Patient taking differently: Take 500 mg by mouth 2 (two) times daily. HOLD until neurosurgery is ok to restart this (waiting for incision to heal) 11/13/23  Yes Sunnie Nielsen, DO  rosuvastatin (CRESTOR) 20 MG tablet Take 1 tablet (20 mg total) by mouth daily. 02/16/24  Yes Jonathon Berry, PA-C  sertraline (ZOLOFT) 100 MG tablet Take 1.5 tablets (150 mg total) by mouth daily. 02/16/24  Yes Jonathon Berry, PA-C   tadalafil (CIALIS) 20 MG tablet Take 1 tablet (20 mg total) by mouth daily as needed for erectile dysfunction. Avoid taking it if low blood pressure 11/25/23  Yes Gillis Santa, MD  albuterol (VENTOLIN HFA) 108 (90 Base) MCG/ACT inhaler Inhale 2 puffs into the lungs every 6 (six) hours as needed for wheezing or shortness of breath. Patient not taking: Reported on 02/26/2024 12/14/23   Margarita Mail, DO  clobetasol (TEMOVATE) 0.05 % external solution Apply topically to affected areas as directed in patient handout as needed for rash. Avoid applying to face, groin, and axilla. Use as directed. Patient not taking: Reported on 02/26/2024 02/01/24  Willeen Niece, MD  cromolyn (OPTICROM) 4 % ophthalmic solution Place 1 drop into both eyes 4 (four) times daily as needed (for itching/allergies). Patient not taking: Reported on 02/26/2024 01/07/24   Jonathon Berry, PA-C  nystatin (MYCOSTATIN/NYSTOP) powder Apply 1 Application topically 3 (three) times daily. Patient not taking: Reported on 02/26/2024 12/14/23   Margarita Mail, DO  nystatin cream (MYCOSTATIN) Apply 1 Application topically 2 (two) times daily. Patient not taking: Reported on 02/26/2024 12/14/23   Margarita Mail, DO  ofloxacin (OCUFLOX) 0.3 % ophthalmic solution Instill 1 to 2 drops in affected eye every 4 hours for the first 2 days, then instill 1 to 2 drops 4 times daily for an additional 5 days. Patient not taking: Reported on 02/26/2024 01/07/24   Jonathon Berry, PA-C  senna (SENOKOT) 8.6 MG TABS tablet Take 2 tablets (17.2 mg total) by mouth at bedtime. Stop if loose stool Patient not taking: Reported on 02/26/2024 11/13/23   Sunnie Nielsen, DO  tamsulosin (FLOMAX) 0.4 MG CAPS capsule Take 1 capsule (0.4 mg total) by mouth daily after supper. Patient not taking: Reported on 02/26/2024 02/16/24   Jonathon Berry, PA-C      VITAL SIGNS:  Blood pressure 137/78, pulse 84, temperature 98.1 F (36.7 C), resp. rate 18, height 6\' 2"  (1.88 m), weight  99.5 kg, SpO2 100%.  PHYSICAL EXAMINATION:  Physical Exam  GENERAL:  63 y.o.-year-old, male patient lying in the bed with no acute distress.  EYES: Pupils equal, round, reactive to light and accommodation. No scleral icterus. Extraocular muscles intact.  HEENT: Head atraumatic, normocephalic. Oropharynx and nasopharynx clear.  NECK:  Supple, no jugular venous distention. No thyroid enlargement, no tenderness.  LUNGS: Normal breath sounds bilaterally, no wheezing, rales,rhonchi or crepitation. No use of accessory muscles of respiration.  CARDIOVASCULAR: Regular rate and rhythm, S1, S2 normal. No murmurs, rubs, or gallops.  ABDOMEN: Soft, nondistended, nontender. Bowel sounds present. No organomegaly or mass.  EXTREMITIES: No pedal edema, cyanosis, or clubbing.  NEUROLOGIC: Cranial nerves II through XII are intact. Muscle strength 5/5 in all extremities. Sensation intact. Gait not checked.  PSYCHIATRIC: The patient is alert and oriented x 3.  Normal affect and good eye contact. SKIN: No obvious rash, lesion, or ulcer.  Musculoskeletal: Left fourth finger contusion. LABORATORY PANEL:   CBC Recent Labs  Lab 02/25/24 1608  WBC 3.8*  HGB 13.0  HCT 40.3  PLT 180   ------------------------------------------------------------------------------------------------------------------  Chemistries  Recent Labs  Lab 02/25/24 1608  NA 137  K 4.0  CL 108  CO2 19*  GLUCOSE 107*  BUN 26*  CREATININE 1.38*  CALCIUM 9.0   ------------------------------------------------------------------------------------------------------------------  Cardiac Enzymes No results for input(s): "TROPONINI" in the last 168 hours. ------------------------------------------------------------------------------------------------------------------  RADIOLOGY:  DG Hand Complete Right Result Date: 02/25/2024 CLINICAL DATA:  Fall with fourth digit pain EXAM: RIGHT HAND - COMPLETE 3+ VIEW COMPARISON:  None Available.  FINDINGS: No malalignment. Chronic appearing fracture deformity of the fifth metacarpal. Soft tissues are unremarkable IMPRESSION: No acute osseous abnormality. Chronic appearing fifth metacarpal fracture Electronically Signed   By: Jasmine Pang M.D.   On: 02/25/2024 23:12   CT ABDOMEN PELVIS W CONTRAST Result Date: 02/25/2024 CLINICAL DATA:  Trauma syncopal episode EXAM: CT ABDOMEN AND PELVIS WITH CONTRAST TECHNIQUE: Multidetector CT imaging of the abdomen and pelvis was performed using the standard protocol following bolus administration of intravenous contrast. RADIATION DOSE REDUCTION: This exam was performed according to the departmental dose-optimization program which includes automated exposure control, adjustment of the mA  and/or kV according to patient size and/or use of iterative reconstruction technique. CONTRAST:  OMNIPAQUE IOHEXOL 350 MG/ML SOLN COMPARISON:  11/05/2023, 03/17/2018 FINDINGS: Lower chest: Lung bases demonstrate scarring at the peripheral right base. Coronary vascular calcification. Small hiatal hernia Hepatobiliary: Chronic cystic and calcified lesion in the right hepatic lobe. No calcified gallstone or biliary dilatation. Pancreas: Unremarkable. No pancreatic ductal dilatation or surrounding inflammatory changes. Spleen: Normal in size without focal abnormality. Adrenals/Urinary Tract: Adrenal glands are normal. Kidneys show no hydronephrosis. Subcentimeter hypodensities too small to further characterize, no imaging follow-up is recommended. Bladder is unremarkable Stomach/Bowel: Stomach is within normal limits. Appendix appears normal. No evidence of bowel wall thickening, distention, or inflammatory changes. Probable pill fragments in the right colon. Diverticular disease of the left colon. Vascular/Lymphatic: Aortic atherosclerosis. No enlarged abdominal or pelvic lymph nodes. Reproductive: Prostate is unremarkable. Other: Negative for pelvic effusion or free air. Small fat  containing umbilical hernia Musculoskeletal: Threaded screws in the proximal left femur with artifact. Posterior spinal rods and fixating screws to the level of T12. Treated compression deformities of the thoracic spine and at L4. Mild chronic superior endplate deformities at L1, L2 and L3. Left posterior spinal fusion hardware at L5-S1 with additional anterior fusion hardware. Soft tissue stranding within the left lower quadrant abdominal wall soft tissues consistent with contusion IMPRESSION: 1. No CT evidence for acute intra-abdominal or pelvic abnormality. 2. Soft tissue stranding within the left lower quadrant anterior abdominal wall soft tissues consistent with a contusion 3. Multiple chronic findings as discussed above Electronically Signed   By: Jasmine Pang M.D.   On: 02/25/2024 23:11   CT Angio Chest PE W and/or Wo Contrast Result Date: 02/25/2024 CLINICAL DATA:  Left rib pain fall syncope EXAM: CT ANGIOGRAPHY CHEST WITH CONTRAST TECHNIQUE: Multidetector CT imaging of the chest was performed using the standard protocol during bolus administration of intravenous contrast. Multiplanar CT image reconstructions and MIPs were obtained to evaluate the vascular anatomy. RADIATION DOSE REDUCTION: This exam was performed according to the departmental dose-optimization program which includes automated exposure control, adjustment of the mA and/or kV according to patient size and/or use of iterative reconstruction technique. CONTRAST:  OMNIPAQUE IOHEXOL 350 MG/ML SOLN COMPARISON:  Chest CT 01/20/2024 FINDINGS: Cardiovascular: Satisfactory opacification of the pulmonary arteries to the segmental level. No evidence of pulmonary embolism. Nonaneurysmal aorta. No dissection. Mild atherosclerosis. Coronary vascular calcification. Normal cardiac size. No pericardial effusion Mediastinum/Nodes: Patent trachea. No thyroid mass. No suspicious lymph nodes. Esophagus shows small hiatal hernia Lungs/Pleura: Lungs are  clear. No pleural effusion or pneumothorax. Upper Abdomen: See separately dictated abdominopelvic CT Musculoskeletal: Posterior spinal fusion hardware from T7, inferior extent incompletely included. Treated compression deformities at T7, T8, T11 and T12. Chronic compression deformities at T1, T2 and T3. Review of the MIP images confirms the above findings. IMPRESSION: 1. Negative for acute pulmonary embolism or aortic dissection 2. Clear lung fields Electronically Signed   By: Jasmine Pang M.D.   On: 02/25/2024 23:01   CT Cervical Spine Wo Contrast Result Date: 02/25/2024 CLINICAL DATA:  Trauma syncope EXAM: CT CERVICAL SPINE WITHOUT CONTRAST TECHNIQUE: Multidetector CT imaging of the cervical spine was performed without intravenous contrast. Multiplanar CT image reconstructions were also generated. RADIATION DOSE REDUCTION: This exam was performed according to the departmental dose-optimization program which includes automated exposure control, adjustment of the mA and/or kV according to patient size and/or use of iterative reconstruction technique. COMPARISON:  CT 11/05/2023 FINDINGS: Alignment: Stable alignment. No subluxation. Facet  alignment is normal Skull base and vertebrae: Mild chronic compression deformities at T1, T2 and T3. No acute fracture is seen. Soft tissues and spinal canal: No prevertebral fluid or swelling. No visible canal hematoma. Disc levels:  Moderate multilevel degenerative changes. Upper chest: Multiple punctate parotid calcifications with atrophy of the salivary gland, correlate for any history of inflammatory or autoimmune disease. Lung apices are clear Other: None IMPRESSION: Degenerative changes.  No acute osseous abnormality. Electronically Signed   By: Jasmine Pang M.D.   On: 02/25/2024 22:54   CT HEAD WO CONTRAST Result Date: 02/25/2024 CLINICAL DATA:  Syncopal episode. EXAM: CT HEAD WITHOUT CONTRAST TECHNIQUE: Contiguous axial images were obtained from the base of the skull  through the vertex without intravenous contrast. RADIATION DOSE REDUCTION: This exam was performed according to the departmental dose-optimization program which includes automated exposure control, adjustment of the mA and/or kV according to patient size and/or use of iterative reconstruction technique. COMPARISON:  November 23, 2023 FINDINGS: Brain: No evidence of acute infarction, hemorrhage, hydrocephalus, extra-axial collection or mass lesion/mass effect. 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 Vascular: Marked severity calcification of the bilateral cavernous carotid arteries is noted. Skull: Normal. Negative for fracture or focal lesion. Sinuses/Orbits: No acute finding. Other: None. IMPRESSION: No acute intracranial abnormality. Electronically Signed   By: Aram Candela M.D.   On: 02/25/2024 19:14      IMPRESSION AND PLAN:  Assessment and Plan: * Syncope - The patient will be admitted to an observation medically monitored bed. - Will check orthostatics q 12 hours. - Will obtain a bilateral carotid Doppler and 2D echo. - The patient will be gently hydrated with IV normal saline and monitored for arrhythmias. -Differential diagnoses would include neurally mediated syncope, cardiogenic, arrhythmias related,  orthostatic hypotension and less likely hypoglycemia.    Vertigo - Will obtain a brain MRI without contrast to assess for VBI. - We will place him on as needed Antivert.  Essential hypertension - We will continue antihypertensive therapy.  Dyslipidemia - We will continue statin therapy.  Anxiety and depression - We will continue Klonopin and Zoloft.  History of pulmonary embolism - We will continue Eliquis.  Peripheral neuropathy - We will continue Neurontin.  BPH (benign prostatic hyperplasia) - We will continue Flomax.       DVT prophylaxis: Lovenox.  Advanced Care Planning:  Code Status: full code.  Family  Communication:  The plan of care was discussed in details with the patient (and family). I answered all questions. The patient agreed to proceed with the above mentioned plan. Further management will depend upon hospital course. Disposition Plan: Back to previous home environment Consults called: none.  All the records are reviewed and case discussed with ED provider.  Status is: Observation  I certify that at the time of admission, it is my clinical judgment that the patient will require  hospital care extending less than 2 midnights.                            Dispo: The patient is from: Home              Anticipated d/c is to: Home              Patient currently is not medically stable to d/c.              Difficult to place patient: No  Copper Kirtley A  Austyn Seier M.D on 02/26/2024 at 4:06 AM  [facility]   From 7 PM-7 AM, contact night-coverage [email or web address]  CC: Primary care physician; Jonathon Berry, PA-C

## 2024-02-26 NOTE — Assessment & Plan Note (Signed)
 -  We will continue Eliquis.

## 2024-02-26 NOTE — Progress Notes (Signed)
 PROGRESS NOTE    Jonathon Snow  ZOX:[id number] DOB: [birth year] DOA: 02/25/2024 PCP: Danelle Berry, PA-C    Brief Narrative:   63 y.o. Caucasian male with medical history significant for GERD, depression, DVT, hypertension, dyslipidemia, PE and coronary artery disease, who presented to the emergency room with acute onset of syncope.  He has been experiencing near syncope over the last few weeks.  Lightheadedness and dizziness.  Before he came to the ER and while getting out of the shower he had a syncopal episode and hit the edge of the tub with his left side.  He had subsequent left lower chest pain and right fourth finger contusion.  He comes in denied any paresthesias or focal muscle weakness.  No chest pain or palpitations.  He admits to recent mild dyspnea without significant cough or wheezing.  He admits to chills without measured fever.  He has been having vertigo.  No nausea or vomiting or abdominal pain.    Assessment & Plan:   Principal Problem:   Syncope Active Problems:   Essential hypertension   Vertigo   Dyslipidemia   Anxiety and depression   History of pulmonary embolism   BPH (benign prostatic hyperplasia)   Peripheral neuropathy  Recurrent syncope Vertigo Etiology is unclear.  Could be a component of possible autonomic dysfunction.  MRI brain no acute abnormalities that could explain symptoms.  The patient is known to Dr. Okey Dupre from cardiology.  Case discussed with him at length.  Patient has been through numerous medication titrations and seems to have quite labile blood pressures raising the concern for underlying dysautonomia. Plan: Twice daily orthostatics PT evaluations Appreciate cardiology recommendations Neurology consulted Spot EEG   Essential hypertension Blood pressure currently controlled however patient has extremely labile pressures Continue Toprol-XL and losartan   Dyslipidemia PTA statin   Anxiety and depression PTA Klonopin and Zoloft    History of pulmonary embolism PTA Eliquis  Peripheral neuropathy PTA Neurontin   BPH (benign prostatic hyperplasia) PTA Flomax  CAD Antiplatelet Beta-blocker, ACE inhibitor   DVT prophylaxis: Eliquis Code Status: Full Family Communication: None Disposition Plan: Status is: Observation The patient will require care spanning > 2 midnights and should be moved to inpatient because: Ambulatory dysfunction.  Recurrent syncope of unclear etiology.  Needs further workup.   Level of care: Telemetry Medical  Consultants:  Neurology Cardiology  Procedures:  None  Antimicrobials: None   Subjective: Seen and examined.  Lying in bed.  No pain complaints but continues to endorse labile blood pressures dizziness.  Objective: Vitals:   02/26/24 0130 02/26/24 0318 02/26/24 0801 02/26/24 0944  BP: 131/65 137/78 126/75 (!) 151/85  Pulse: 81 84 85   Resp: 16 18 18    Temp:  98.1 F (36.7 C) (!) 97.4 F (36.3 C)   TempSrc:      SpO2: 95% 100% 98%   Weight:  99.5 kg    Height:  6\' 2"  (1.88 m)      Intake/Output Summary (Last 24 hours) at 02/26/2024 1337 Last data filed at 02/26/2024 1121 Gross per 24 hour  Intake 962.22 ml  Output --  Net 962.22 ml   Filed Weights   02/25/24 1557 02/26/24 0318  Weight: 102.1 kg 99.5 kg    Examination:  General exam: Appears calm and comfortable  Respiratory system: Clear to auscultation. Respiratory effort normal. Cardiovascular system: S1-S2, RRR, no murmurs, no pedal edema Gastrointestinal system: Soft, NT/ND, normal bowel sounds Central nervous system: Alert and oriented. No focal neurological  deficits. Extremities: Decreased power bilateral lower extremities.  Gait not assessed Skin: No rashes, lesions or ulcers Psychiatry: Judgement and insight appear normal. Mood & affect appropriate.     Data Reviewed: I have personally reviewed following labs and imaging studies  CBC: Recent Labs  Lab 02/25/24 1608 02/26/24 0501  WBC  3.8* 3.4*  HGB 13.0 11.3*  HCT 40.3 35.0*  MCV 89.8 88.2  PLT 180 158   Basic Metabolic Panel: Recent Labs  Lab 02/25/24 1608 02/26/24 0501  NA 137 137  K 4.0 3.5  CL 108 109  CO2 19* 18*  GLUCOSE 107* 145*  BUN 26* 28*  CREATININE 1.38* 1.61*  CALCIUM 9.0 9.0   GFR: Estimated Creatinine Clearance: 60 mL/min (A) (by C-G formula based on SCr of 1.61 mg/dL (H)). Liver Function Tests: No results for input(s): "AST", "ALT", "ALKPHOS", "BILITOT", "PROT", "ALBUMIN" in the last 168 hours. No results for input(s): "LIPASE", "AMYLASE" in the last 168 hours. No results for input(s): "AMMONIA" in the last 168 hours. Coagulation Profile: No results for input(s): "INR", "PROTIME" in the last 168 hours. Cardiac Enzymes: No results for input(s): "CKTOTAL", "CKMB", "CKMBINDEX", "TROPONINI" in the last 168 hours. BNP (last 3 results) No results for input(s): "PROBNP" in the last 8760 hours. HbA1C: No results for input(s): "HGBA1C" in the last 72 hours. CBG: Recent Labs  Lab 02/25/24 1905 02/26/24 0403  GLUCAP 87 136*   Lipid Profile: No results for input(s): "CHOL", "HDL", "LDLCALC", "TRIG", "CHOLHDL", "LDLDIRECT" in the last 72 hours. Thyroid Function Tests: No results for input(s): "TSH", "T4TOTAL", "FREET4", "T3FREE", "THYROIDAB" in the last 72 hours. Anemia Panel: No results for input(s): "VITAMINB12", "FOLATE", "FERRITIN", "TIBC", "IRON", "RETICCTPCT" in the last 72 hours. Sepsis Labs: No results for input(s): "PROCALCITON", "LATICACIDVEN" in the last 168 hours.  No results found for this or any previous visit (from the past 240 hours).       Radiology Studies: MR BRAIN WO CONTRAST Result Date: 02/26/2024 CLINICAL DATA:  63 year old male neurologic deficit, syncope, fluctuations in blood pressure. EXAM: MRI HEAD WITHOUT CONTRAST TECHNIQUE: Multiplanar, multiecho pulse sequences of the brain and surrounding structures were obtained without intravenous contrast. COMPARISON:   Brain MRI 11/23/2023. Head CT yesterday. FINDINGS: Brain: No restricted diffusion to suggest acute infarction. No midline shift, mass effect, evidence of mass lesion, ventriculomegaly, extra-axial collection or acute intracranial hemorrhage. Mega cisterna magna, normal variant. Cervicomedullary junction is within normal limits. Negative pituitary. Wallace Cullens and white matter signal is stable since December, and largely normal for age. No convincing cortical encephalomalacia. But there is evidence of a solitary chronic microhemorrhage in the left corona radiata on SWI, stable. Vascular: Major intracranial vascular flow voids are stable. Skull and upper cervical spine: Negative for age visible cervical spine. Visualized bone marrow signal is within normal limits. Sinuses/Orbits: Negative orbits. Paranasal sinuses are stable, well aerated overall. Other: Mastoids are clear. Grossly normal visible internal auditory structures. Chronically nodular, heterogeneous appearance of the bilateral parotid and salivary glands. IMPRESSION: 1. No acute intracranial abnormality. 2. Normal for age noncontrast MRI appearance of the Brain aside from solitary chronic microhemorrhage in the left corona radiata. 3. Chronically nodular, heterogeneous appearance of the bilateral parotid and salivary glands, suggesting chronic/recurrent salivary inflammation such as due to Sjogren syndrome or other systemic inflammatory process. Electronically Signed   By: Odessa Fleming M.D.   On: 02/26/2024 04:32   DG Hand Complete Right Result Date: 02/25/2024 CLINICAL DATA:  Fall with fourth digit pain EXAM: RIGHT HAND - COMPLETE  3+ VIEW COMPARISON:  None Available. FINDINGS: No malalignment. Chronic appearing fracture deformity of the fifth metacarpal. Soft tissues are unremarkable IMPRESSION: No acute osseous abnormality. Chronic appearing fifth metacarpal fracture Electronically Signed   By: Jasmine Pang M.D.   On: 02/25/2024 23:12   CT ABDOMEN PELVIS W  CONTRAST Result Date: 02/25/2024 CLINICAL DATA:  Trauma syncopal episode EXAM: CT ABDOMEN AND PELVIS WITH CONTRAST TECHNIQUE: Multidetector CT imaging of the abdomen and pelvis was performed using the standard protocol following bolus administration of intravenous contrast. RADIATION DOSE REDUCTION: This exam was performed according to the departmental dose-optimization program which includes automated exposure control, adjustment of the mA and/or kV according to patient size and/or use of iterative reconstruction technique. CONTRAST:  OMNIPAQUE IOHEXOL 350 MG/ML SOLN COMPARISON:  11/05/2023, 03/17/2018 FINDINGS: Lower chest: Lung bases demonstrate scarring at the peripheral right base. Coronary vascular calcification. Small hiatal hernia Hepatobiliary: Chronic cystic and calcified lesion in the right hepatic lobe. No calcified gallstone or biliary dilatation. Pancreas: Unremarkable. No pancreatic ductal dilatation or surrounding inflammatory changes. Spleen: Normal in size without focal abnormality. Adrenals/Urinary Tract: Adrenal glands are normal. Kidneys show no hydronephrosis. Subcentimeter hypodensities too small to further characterize, no imaging follow-up is recommended. Bladder is unremarkable Stomach/Bowel: Stomach is within normal limits. Appendix appears normal. No evidence of bowel wall thickening, distention, or inflammatory changes. Probable pill fragments in the right colon. Diverticular disease of the left colon. Vascular/Lymphatic: Aortic atherosclerosis. No enlarged abdominal or pelvic lymph nodes. Reproductive: Prostate is unremarkable. Other: Negative for pelvic effusion or free air. Small fat containing umbilical hernia Musculoskeletal: Threaded screws in the proximal left femur with artifact. Posterior spinal rods and fixating screws to the level of T12. Treated compression deformities of the thoracic spine and at L4. Mild chronic superior endplate deformities at L1, L2 and L3. Left  posterior spinal fusion hardware at L5-S1 with additional anterior fusion hardware. Soft tissue stranding within the left lower quadrant abdominal wall soft tissues consistent with contusion IMPRESSION: 1. No CT evidence for acute intra-abdominal or pelvic abnormality. 2. Soft tissue stranding within the left lower quadrant anterior abdominal wall soft tissues consistent with a contusion 3. Multiple chronic findings as discussed above Electronically Signed   By: Jasmine Pang M.D.   On: 02/25/2024 23:11   CT Angio Chest PE W and/or Wo Contrast Result Date: 02/25/2024 CLINICAL DATA:  Left rib pain fall syncope EXAM: CT ANGIOGRAPHY CHEST WITH CONTRAST TECHNIQUE: Multidetector CT imaging of the chest was performed using the standard protocol during bolus administration of intravenous contrast. Multiplanar CT image reconstructions and MIPs were obtained to evaluate the vascular anatomy. RADIATION DOSE REDUCTION: This exam was performed according to the departmental dose-optimization program which includes automated exposure control, adjustment of the mA and/or kV according to patient size and/or use of iterative reconstruction technique. CONTRAST:  OMNIPAQUE IOHEXOL 350 MG/ML SOLN COMPARISON:  Chest CT 01/20/2024 FINDINGS: Cardiovascular: Satisfactory opacification of the pulmonary arteries to the segmental level. No evidence of pulmonary embolism. Nonaneurysmal aorta. No dissection. Mild atherosclerosis. Coronary vascular calcification. Normal cardiac size. No pericardial effusion Mediastinum/Nodes: Patent trachea. No thyroid mass. No suspicious lymph nodes. Esophagus shows small hiatal hernia Lungs/Pleura: Lungs are clear. No pleural effusion or pneumothorax. Upper Abdomen: See separately dictated abdominopelvic CT Musculoskeletal: Posterior spinal fusion hardware from T7, inferior extent incompletely included. Treated compression deformities at T7, T8, T11 and T12. Chronic compression deformities at T1, T2  and T3. Review of the MIP images confirms the above findings. IMPRESSION: 1.  Negative for acute pulmonary embolism or aortic dissection 2. Clear lung fields Electronically Signed   By: Jasmine Pang M.D.   On: 02/25/2024 23:01   CT Cervical Spine Wo Contrast Result Date: 02/25/2024 CLINICAL DATA:  Trauma syncope EXAM: CT CERVICAL SPINE WITHOUT CONTRAST TECHNIQUE: Multidetector CT imaging of the cervical spine was performed without intravenous contrast. Multiplanar CT image reconstructions were also generated. RADIATION DOSE REDUCTION: This exam was performed according to the departmental dose-optimization program which includes automated exposure control, adjustment of the mA and/or kV according to patient size and/or use of iterative reconstruction technique. COMPARISON:  CT 11/05/2023 FINDINGS: Alignment: Stable alignment. No subluxation. Facet alignment is normal Skull base and vertebrae: Mild chronic compression deformities at T1, T2 and T3. No acute fracture is seen. Soft tissues and spinal canal: No prevertebral fluid or swelling. No visible canal hematoma. Disc levels:  Moderate multilevel degenerative changes. Upper chest: Multiple punctate parotid calcifications with atrophy of the salivary gland, correlate for any history of inflammatory or autoimmune disease. Lung apices are clear Other: None IMPRESSION: Degenerative changes.  No acute osseous abnormality. Electronically Signed   By: Jasmine Pang M.D.   On: 02/25/2024 22:54   CT HEAD WO CONTRAST Result Date: 02/25/2024 CLINICAL DATA:  Syncopal episode. EXAM: CT HEAD WITHOUT CONTRAST TECHNIQUE: Contiguous axial images were obtained from the base of the skull through the vertex without intravenous contrast. RADIATION DOSE REDUCTION: This exam was performed according to the departmental dose-optimization program which includes automated exposure control, adjustment of the mA and/or kV according to patient size and/or use of iterative reconstruction  technique. COMPARISON:  November 23, 2023 FINDINGS: Brain: No evidence of acute infarction, hemorrhage, hydrocephalus, extra-axial collection or mass lesion/mass effect. 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 Vascular: Marked severity calcification of the bilateral cavernous carotid arteries is noted. Skull: Normal. Negative for fracture or focal lesion. Sinuses/Orbits: No acute finding. Other: None. IMPRESSION: No acute intracranial abnormality. Electronically Signed   By: Aram Candela M.D.   On: 02/25/2024 19:14        Scheduled Meds:  apixaban  5 mg Oral BID   ARIPiprazole  2 mg Oral Daily   cholecalciferol  1,000 Units Oral Daily   clonazePAM  0.5 mg Oral Daily   clopidogrel  75 mg Oral Daily   gabapentin  200 mg Oral TID   hydrALAZINE  25 mg Oral TID   loratadine  10 mg Oral QPM   losartan  50 mg Oral Daily   metoprolol succinate  25 mg Oral Daily   montelukast  10 mg Oral QHS   multivitamin with minerals  1 tablet Oral Daily   mycophenolate  500 mg Oral BID   rosuvastatin  20 mg Oral Daily   sertraline  150 mg Oral Daily   sodium chloride flush  3 mL Intravenous Q12H   Continuous Infusions:  sodium chloride 100 mL/hr at 02/26/24 0641     LOS: 0 days      Tresa Moore, MD [facility]   If 7PM-7AM, please contact night-coverage  02/26/2024, 1:37 PM

## 2024-02-26 NOTE — Assessment & Plan Note (Signed)
 -  We will continue Neurontin. ?

## 2024-02-26 NOTE — ED Notes (Signed)
 Patient transported to MRI

## 2024-02-26 NOTE — Assessment & Plan Note (Signed)
-  The patient will be admitted to an observation medically monitored bed. - Will check orthostatics q 12 hours. - Will obtain a bilateral carotid Doppler and 2D echo. - The patient will be gently hydrated with IV normal saline and monitored for arrhythmias. -Differential diagnoses would include neurally mediated syncope, cardiogenic, arrhythmias related,  orthostatic hypotension and less likely hypoglycemia.

## 2024-02-26 NOTE — Progress Notes (Signed)
   02/26/24 1858  Vitals  Temp 97.6 F (36.4 C)  Temp Source Oral  BP (!) 81/61  MAP (mmHg) 69  BP Location Right Arm  BP Method Automatic  Patient Position (if appropriate) Sitting  Pulse Rate 83  Pulse Rate Source Monitor  Resp 15    Patients' complained of seeing stars and severe dizziness / nausea after standing up en route to MRI. Patients' BP showed 81/61. Patient taken back to bed and MRI notified. Will notify care team/ pass on info to night shift.

## 2024-02-26 NOTE — Plan of Care (Signed)
  Problem: Education: Goal: Knowledge of condition and prescribed therapy will improve Outcome: Progressing   Problem: Cardiac: Goal: Will achieve and/or maintain adequate cardiac output Outcome: Progressing   Problem: Education: Goal: Knowledge of General Education information will improve Description: Including pain rating scale, medication(s)/side effects and non-pharmacologic comfort measures Outcome: Progressing   Problem: Activity: Goal: Risk for activity intolerance will decrease Outcome: Progressing   Problem: Coping: Goal: Level of anxiety will decrease Outcome: Progressing   Problem: Elimination: Goal: Will not experience complications related to bowel motility Outcome: Progressing Goal: Will not experience complications related to urinary retention Outcome: Progressing   Problem: Pain Managment: Goal: General experience of comfort will improve and/or be controlled Outcome: Progressing   Problem: Safety: Goal: Ability to remain free from injury will improve Outcome: Progressing

## 2024-02-26 NOTE — Progress Notes (Signed)
 Arrived to floor on stretcher by RN, Pt alert and orientated. Introduced to room and call bell. Made comfortable in bed.

## 2024-02-26 NOTE — Progress Notes (Signed)
 Orthostatic BP  Lying 151/85 (100) HR 89  Sitting 149/80 (95) HR 88  Standing 159/80 (96) HR 93

## 2024-02-26 NOTE — Assessment & Plan Note (Signed)
-   We will continue antihypertensive therapy.

## 2024-02-26 NOTE — Assessment & Plan Note (Signed)
 -  We will continue Flomax

## 2024-02-26 NOTE — Consult Note (Signed)
 NEUROLOGY CONSULT NOTE   Date of service: February 26, 2024 Patient Name: Jonathon Snow MRN:  [id number] DOB:  1961-02-11 Chief Complaint: "dizziness, syncope" Requesting Provider: Tresa Moore, MD  History of Present Illness  Jonathon Snow is a 63 y.o. 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  He describes some as episodes of lightheadedness and dizziness as well as overall instability when he is trying to get up but he can have the same feeling while he is lying down or turning over sides.  Before coming to the ER though he had an episode where he syncopized and hit his head at the edge of the tub on the left side.  He did not have any tingling numbness or weakness.  He reports some tremulousness in both his hands upon trying to reach for objects that has been going on for a while now. He is scheduled to see a neurologist but that appointment is not until April. He has been evaluated by cardiology and has gone through numerous blood pressure medication titrations but has had quite labile blood pressures which also raise concern for underlying dysautonomia.  He is not a very reliable historian but he does report that he has no tremulousness at rest.  His gait seems to have worsened because of the constant dizziness and lightheadedness.  Reports of severe neck pain that has worsened over the past few weeks to months   Review of home medications: Specifically looking at medications with potential to cause dizziness lightheadedness etc.-clonazepam, aripiprazole, gabapentin, hydroxyzine, Robaxin, CellCept, sertraline, Flomax.    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   Overview:  superior sagittal sinus, left transverse sinus and cortical veins    Closed left hip fracture, initial encounter (HCC) 02/18/2023   COVID-19 virus infection 12/2020   Depression    DVT (deep venous thrombosis) (HCC)    Dyspnea    GERD (gastroesophageal reflux disease)    Head injury 11/05/2023   Heart attack (HCC) 11/05/2023   Heel spur, left 02/16/2019   Heel spur, right 02/16/2019   Hematoma of groin 11/08/2023   Herpes zoster infection of lumbar region 02/20/2020   History of DVT (deep vein thrombosis) 03/22/2020   History of kidney stones    Hyperlipidemia    Hypertension    Long term current use of systemic steroids 11/08/2020   Lupus    Lymphedema 10/07/2018   Morbid obesity (HCC)    Opiate abuse, episodic (HCC) 02/26/2018   Osteoporosis    Pain and swelling of right lower extremity 02/03/2023   Pneumonia    PONV (postoperative nausea and vomiting)    Postphlebitic syndrome with ulcer, 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   Presence of IVC filter 03/22/2020   Removed   Pulmonary embolism (HCC)    Renal disorder    Stage III   Severe episode of recurrent major depressive disorder, 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   Shock circulatory (HCC) 11/06/2023   STEMI (ST elevation myocardial infarction) (HCC) 11/05/2023    Past Surgical History:  Procedure Laterality Date  ANKLE SURGERY Right    BACK SURGERY     BRONCHIAL WASHINGS N/A 11/01/2021   Procedure: BRONCHIAL WASHINGS;  Surgeon: Vida Rigger, MD;  Location: [facility] ORS;  Service: Thoracic;  Laterality: N/A;   COLONOSCOPY WITH PROPOFOL N/A 05/28/2020   Procedure: COLONOSCOPY WITH PROPOFOL;  Surgeon: Wyline Mood, MD;  Location: [facility] ENDOSCOPY;  Service: Endoscopy;  Laterality: N/A;   COLONOSCOPY WITH PROPOFOL N/A 07/01/2023   Procedure:  COLONOSCOPY WITH PROPOFOL;  Surgeon: Wyline Mood, MD;  Location: [facility] ENDOSCOPY;  Service: Gastroenterology;  Laterality: N/A;   CORONARY ULTRASOUND/IVUS N/A 11/05/2023   Procedure: Coronary Ultrasound/IVUS;  Surgeon: Yvonne Kendall, MD;  Location: [facility] INVASIVE CV LAB;  Service: Cardiovascular;  Laterality: N/A;   CORONARY/GRAFT ACUTE MI REVASCULARIZATION N/A 11/05/2023   Procedure: Coronary/Graft Acute MI Revascularization;  Surgeon: Yvonne Kendall, MD;  Location: [facility] INVASIVE CV LAB;  Service: Cardiovascular;  Laterality: N/A;   CYST EXCISION  92 or 93    Liver cyst removal [facility]   FLEXIBLE BRONCHOSCOPY N/A 11/01/2021   Procedure: FLEXIBLE BRONCHOSCOPY;  Surgeon: Vida Rigger, MD;  Location: [facility] ORS;  Service: Thoracic;  Laterality: N/A;   HIP PINNING,CANNULATED Left 02/19/2023   Procedure: PERCUTANEOUS FIXATION OF FEMORAL NECK;  Surgeon: Signa Kell, MD;  Location: [facility] ORS;  Service: Orthopedics;  Laterality: Left;   I & D EXTREMITY Right 04/29/2017   Procedure: IRRIGATION AND DEBRIDEMENT EXTREMITY;  Surgeon: Ricarda Frame, MD;  Location: [facility] ORS;  Service: General;  Laterality: Right;   IRRIGATION AND DEBRIDEMENT ABSCESS Left 04/29/2017   Procedure: IRRIGATION AND DEBRIDEMENT Scrotal ABSCESS;  Surgeon: Ricarda Frame, MD;  Location: [facility] ORS;  Service: General;  Laterality: Left;   LEFT HEART CATH AND CORONARY ANGIOGRAPHY N/A 11/05/2023   Procedure: LEFT HEART CATH AND CORONARY ANGIOGRAPHY;  Surgeon: Yvonne Kendall, MD;  Location: [facility] INVASIVE CV LAB;  Service: Cardiovascular;  Laterality: N/A;   POLYPECTOMY  07/01/2023   Procedure: POLYPECTOMY;  Surgeon: Wyline Mood, MD;  Location: [facility] ENDOSCOPY;  Service: Gastroenterology;;    Family History: Family History  Problem Relation Age of Onset   Hypertension Father    Heart disease Father    Clotting disorder Mother    Kidney disease Brother    Heart attack Maternal Grandmother    Heart attack Maternal Grandfather     Heart attack Paternal Grandfather     Social History  reports that he has never smoked. He has never used smokeless tobacco. He reports that he does not drink alcohol and does not use drugs.  Allergies  Allergen Reactions   Enalapril Other (See Comments)    Unknown reaction   Vicodin [Hydrocodone-Acetaminophen] Hives and Rash    Severe headaches (also) NAME BRAND ONLY PER PT CAN TAKE GENERIC    Medications   Current Facility-Administered Medications:    acetaminophen (TYLENOL) tablet 650 mg, 650 mg, Oral, Q6H PRN **OR** acetaminophen (TYLENOL) suppository 650 mg, 650 mg, Rectal, Q6H PRN, Mansy, Jan A, MD   apixaban (ELIQUIS) tablet 5 mg, 5 mg, Oral, BID, Mansy, Jan A, MD, 5 mg at 02/26/24 1610   ARIPiprazole (ABILIFY) tablet 2 mg, 2 mg, Oral, Daily, Mansy, Jan A, MD, 2 mg at 02/26/24 1105   cholecalciferol (VITAMIN D3) 25 MCG (1000 UNIT) tablet 1,000 Units, 1,000 Units, Oral, Daily, Mansy, Jan A, MD, 1,000 Units at 02/26/24 9604   clonazePAM (KLONOPIN) tablet 0.5 mg, 0.5 mg, Oral, Daily, Mansy, Jan A, MD, 0.5 mg at 02/26/24 0943   clopidogrel (PLAVIX) tablet 75 mg, 75 mg, Oral, Daily,  Mansy, Jan A, MD, 75 mg at 02/26/24 5956   gabapentin (NEURONTIN) capsule 200 mg, 200 mg, Oral, TID, Mansy, Jan A, MD, 200 mg at 02/26/24 3875   hydrALAZINE (APRESOLINE) tablet 25 mg, 25 mg, Oral, TID, Mansy, Jan A, MD, 25 mg at 02/26/24 0944   hydrOXYzine (ATARAX) tablet 10 mg, 10 mg, Oral, Q8H PRN, Mansy, Jan A, MD   loratadine (CLARITIN) tablet 10 mg, 10 mg, Oral, QPM, Mansy, Jan A, MD   losartan (COZAAR) tablet 50 mg, 50 mg, Oral, Daily, Mansy, Jan A, MD, 50 mg at 02/26/24 6433   magnesium hydroxide (MILK OF MAGNESIA) suspension 30 mL, 30 mL, Oral, Daily PRN, Mansy, Jan A, MD   meclizine (ANTIVERT) tablet 12.5 mg, 12.5 mg, Oral, TID PRN, Mansy, Jan A, MD   methocarbamol (ROBAXIN) tablet 500 mg, 500 mg, Oral, Q8H PRN, Mansy, Jan A, MD, 500 mg at 02/26/24 0341   metoprolol succinate (TOPROL-XL) 24 hr  tablet 25 mg, 25 mg, Oral, Daily, Mansy, Jan A, MD, 25 mg at 02/26/24 0944   montelukast (SINGULAIR) tablet 10 mg, 10 mg, Oral, QHS, Mansy, Jan A, MD   multivitamin with minerals tablet 1 tablet, 1 tablet, Oral, Daily, Mansy, Jan A, MD, 1 tablet at 02/26/24 2951   mycophenolate (CELLCEPT) capsule 500 mg, 500 mg, Oral, BID, Mansy, Jan A, MD, 500 mg at 02/26/24 1105   ondansetron (ZOFRAN) tablet 4 mg, 4 mg, Oral, Q6H PRN **OR** ondansetron (ZOFRAN) injection 4 mg, 4 mg, Intravenous, Q6H PRN, Mansy, Jan A, MD   rosuvastatin (CRESTOR) tablet 20 mg, 20 mg, Oral, Daily, Mansy, Jan A, MD, 20 mg at 02/26/24 0944   sertraline (ZOLOFT) tablet 150 mg, 150 mg, Oral, Daily, Mansy, Jan A, MD, 150 mg at 02/26/24 0942   sodium chloride flush (NS) 0.9 % injection 3 mL, 3 mL, Intravenous, Q12H, Mansy, Jan A, MD   traZODone (DESYREL) tablet 25 mg, 25 mg, Oral, QHS PRN, Mansy, Jan A, MD  Vitals   Vitals:   02/26/24 0130 02/26/24 0318 02/26/24 0801 02/26/24 0944  BP: 131/65 137/78 126/75 (!) 151/85  Pulse: 81 84 85   Resp: 16 18 18    Temp:  98.1 F (36.7 C) (!) 97.4 F (36.3 C)   TempSrc:      SpO2: 95% 100% 98%   Weight:  99.5 kg    Height:  6\' 2"  (1.88 m)      Body mass index is 28.16 kg/m.  Physical Exam  General: Well-developed well-nourished man somewhat disheveled HEENT: Normocephalic/atraumatic Lungs: Clear Cardiovascular: Regular rate rhythm Abdomen nondistended nontender Neurological exam He is awake alert oriented x 3 Speech is not dysarthric He has no evidence of aphasia His attention concentration is mildly diminished Cranial nerves II to XII appear intact Motor examination has no drift in any of the 4 extremities but it does seem like he has a little bit of paratonia in both upper extremities.  There is also mild intention tremor in both upper extremities.  No resting tremor noted.  Tone appears to be grossly normal other than the paratonia at the end. Sensation intact to light  touch Coordination exam with no dysmetria DTRs are mildly brisk all over  Labs/Imaging/Neurodiagnostic studies   CBC:  Recent Labs  Lab 02-28-2024 1608 02/26/24 0501  WBC 3.8* 3.4*  HGB 13.0 11.3*  HCT 40.3 35.0*  MCV 89.8 88.2  PLT 180 158   Basic Metabolic Panel:  Lab Results  Component Value Date   NA 137  02/26/2024   K 3.5 02/26/2024   CO2 18 (L) 02/26/2024   GLUCOSE 145 (H) 02/26/2024   BUN 28 (H) 02/26/2024   CREATININE 1.61 (H) 02/26/2024   CALCIUM 9.0 02/26/2024   GFRNONAA 48 (L) 02/26/2024   GFRAA 53 (L) 11/07/2020   Lipid Panel:  Lab Results  Component Value Date   LDLCALC 119 (H) 11/05/2023   HgbA1c:  Lab Results  Component Value Date   HGBA1C 6.0 (H) 10/28/2023   Urine Drug Screen:     Component Value Date/Time   LABOPIA POSITIVE (A) 02/21/2018 0318   LABOPIA NONE DETECTED 12/10/2013 0127   COCAINSCRNUR NONE DETECTED 02/21/2018 0318   LABBENZ NONE DETECTED 02/21/2018 0318   LABBENZ NONE DETECTED 12/10/2013 0127   AMPHETMU NONE DETECTED 02/21/2018 0318   AMPHETMU NONE DETECTED 12/10/2013 0127   THCU NONE DETECTED 02/21/2018 0318   THCU NONE DETECTED 12/10/2013 0127   LABBARB NONE DETECTED 02/21/2018 0318   LABBARB NONE DETECTED 12/10/2013 0127    Alcohol Level     Component Value Date/Time   ETH <10 02/21/2018 0252   INR  Lab Results  Component Value Date   INR 1.6 (H) 11/24/2023   APTT  Lab Results  Component Value Date   APTT 23 (L) 11/05/2023    CT Head without contrast(Personally reviewed): No acute abnormality.  Normal for age appearance besides solitary chronic microhemorrhage in left coronary artery.  Chronically nodular heterogenous appearance of bilateral parotid and salivary gland suggesting chronic recurrent salivary inflammation-could be due to Sjogren's syndrome or other systemic inflammatory process.  MRI Brain(Personally reviewed): No acute abnormality  ASSESSMENT   Jonathon Snow is a 63 y.o. 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 He has had multiple adjustments of his antihypertensives but has had fluctuations in his blood pressures-has had very labile blood pressures raising concern for some underlying dysautonomia. On examination he does not have frank parkinsonian features but he does have some paratonia and some increased reflexes.  Also complaining of some neck pain.  Would be prudent to rule out a cervical cord lesion causing some of the gait issues and frankly some dizziness/presyncope episodes as well. That said he is on a bunch of medications that can have sedating side effects and can cause a lot of autonomic disturbances 2-these medications include clonazepam, aripiprazole, gabapentin, hydroxyzine Robaxin sertraline Flomax and even medications like CellCept can cause a lot of hypotension dizziness etc.  Impression: Multifactorial syncope  RECOMMENDATIONS  At this time, I would like to got an EEG but there is no technologist available till Monday morning.  This can be done as an outpatient if he is ready for discharge prior to that.  If he remains in the hospital till Monday, obtaining an EEG to rule out any kind of seizure-like activity would be a good idea. Secondly, I would recommend that we discussed with nephrology if there is an alternative to CellCept that he might tolerate better. Thirdly I would recommend to discontinue his Abilify because that can sometimes give parkinsonian symptoms as a side effect although I am not sure if it causes to dysautonomia but the combination of medications that he is on definite looks like polypharmacy. MRI of the brain has been reviewed by me-no abnormalities.  I would look at the C-spine  more carefully. Ideally I would like a CT  angio head and neck but given his lupus nephritis and borderline renal function, I would get an MR angiogram of the head without contrast and carotid Dopplers.  This is mainly to look at carotid stenosis or posterior circulation stenosis  Plan discussed with Dr. Georgeann Oppenheim and Dr. Okey Dupre ______________________________________________________________________    Signed, Milon Dikes, MD [facility]

## 2024-02-26 NOTE — Progress Notes (Signed)
 Occupational Therapy Evaluation Patient Details Name: Jonathon Snow MRN: [id number] DOB: 1961-06-18 Today's Date: 02/26/2024   History of Present Illness   DAVELL BECKSTEAD is a 63 y.o.  male with medical history significant for GERD, depression, DVT, hypertension, dyslipidemia, PE and coronary artery disease, who presented to the emergency room with acute onset of syncope.  He has been experiencing near syncope over the last few weeks.  Lightheadedness and dizziness.  Before he came to the ER and while getting out of the shower he had a syncopal episode and hit the edge of the tub with his left side.  He had subsequent left lower chest pain and right fourth finger contusion. He comes in denied any paresthesias or focal muscle weakness.  No chest pain or palpitations.  He admits to recent mild dyspnea without significant cough or wheezing.  He admits to chills without measured fever. He has be     Clinical Impressions Pt was seen for OT evaluation this date. On entry to room pt supine in bed, Pt reports 9/10 pain - RN notified an in room to assess. Prior to hospital admission, pt was receiving HH services to rehab previous injuries. Pt endorses history of falls d/t syncope/passing out. Pt has been using RW since previous back surgery. Reports increasing fatigue. Pt reports difficulties with IADL/ADLs but doesnt have help, so "he does the best he can." Pt lives alone in one level home. Pt presents to acute OT demonstrating impaired ADL performance, activity tolerance,  functional mobility and pain management skills (See OT problem list for additional functional deficits). Pt currently requires CGA + HOB elevated and use of bed rails for bed mobility. No physical assistance required but visibly effortful. Noted very effortful STS from EOB + RW, pt denied physical assistance. Pt completed sink level grooming tasks with RW + CGA, standing ~34mins. No LOB noted, but limited dynamic standing abilities, pt tended  to keep arms tucked into self during grooming. Pt endorsed the need for assistance at home for home management needs and physical challenges he has been struggling to handle independently. Pt would benefit from skilled OT services to address noted impairments and functional limitations (see below for any additional details) in order to maximize safety and independence while minimizing falls risk and caregiver burden. OT will follow acutely.     If plan is discharge home, recommend the following:   A lot of help with walking and/or transfers;A little help with bathing/dressing/bathroom;Assist for transportation;Help with stairs or ramp for entrance;Assistance with cooking/housework     Functional Status Assessment   Patient has had a recent decline in their functional status and demonstrates the ability to make significant improvements in function in a reasonable and predictable amount of time.     Equipment Recommendations   None recommended by OT      Mobility Bed Mobility Overal bed mobility: Needs Assistance Bed Mobility: Supine to Sit, Sit to Supine     Supine to sit: Used rails, HOB elevated, Contact guard Sit to supine: Used rails, HOB elevated, Contact guard assist   General bed mobility comments: Bed mobility; no physical assistance but effortful due to pain    Transfers Overall transfer level: Needs assistance Equipment used: Rolling walker (2 wheels) Transfers: Sit to/from Stand Sit to Stand: Contact guard assist           General transfer comment: No physical assistance but effortful due to pain levels      Balance Overall balance assessment: Needs  assistance, History of Falls Sitting-balance support: No upper extremity supported, Feet supported Sitting balance-Leahy Scale: Good     Standing balance support: During functional activity, Reliant on assistive device for balance, Bilateral upper extremity supported Standing balance-Leahy Scale:  Fair Standing balance comment: Tolerated standing at sink level ~10 mins no LOB noted arms, limited dynamic standing abilities, very guarded movements                           ADL either performed or assessed with clinical judgement   ADL Overall ADL's : Needs assistance/impaired Eating/Feeding: Modified independent   Grooming: Wash/dry face;Wash/dry hands;Oral care;Brushing hair;Standing;Contact guard assist               Lower Body Dressing: Maximal assistance (Pt reports using sock aid at baseline)               Functional mobility during ADLs: Contact guard assist General ADL Comments: Completed sink level grooming tasks; CGA standing at sink ~10 mins, pt stated he needed to sit down after grooming tasks.     Vision Baseline Vision/History: 1 Wears glasses              Pertinent Vitals/Pain Pain Assessment Pain Assessment: 0-10 Pain Score: 9  Pain Location: Pt states whole L side of body in pain, radiating pain + sharp spasms often. Pain also in neck, shoulders and head. Pain Descriptors / Indicators: Radiating, Shooting, Sharp Pain Intervention(s): Repositioned, Limited activity within patient's tolerance, Monitored during session     Extremity/Trunk Assessment Upper Extremity Assessment Upper Extremity Assessment: Generalized weakness;Overall WFL for tasks assessed   Lower Extremity Assessment Lower Extremity Assessment: Generalized weakness;LLE deficits/detail LLE Deficits / Details: Edema and redness noted - RN notified   Cervical / Trunk Assessment Cervical / Trunk Assessment: Normal   Communication Communication Communication: No apparent difficulties   Cognition Arousal: Alert Behavior During Therapy: WFL for tasks assessed/performed Cognition: No apparent impairments             OT - Cognition Comments: A/Ox4                 Following commands: Intact       Cueing  General Comments   Cueing Techniques: Verbal  cues      Exercises Exercises: Other exercises Other Exercises Other Exercises: Edu: role of OT, Safe ADL completion, log rolling   Shoulder Instructions      Home Living Family/patient expects to be discharged to:: Private residence Living Arrangements: Alone Available Help at Discharge:  (Stated everyone moved away) Type of Home: House Home Access: Level entry     Home Layout: One level     Bathroom Shower/Tub: Chief Strategy Officer: Standard     Home Equipment: Agricultural consultant (2 wheels);BSC/3in1;Shower seat;Toilet riser          Prior Functioning/Environment Prior Level of Function : Independent/Modified Independent;History of Falls (last six months)             Mobility Comments: Endorses fall history d/t syncope/passing out. Pt has been using RW since back surgery. Reports increasing fatigue. ADLs Comments: Reports difficulties with IADL/ADLs but doesnt have help, so "he does the best he can."    OT Problem List: Decreased strength;Decreased activity tolerance;Impaired balance (sitting and/or standing);Decreased coordination;Decreased safety awareness;Decreased knowledge of use of DME or AE;Pain   OT Treatment/Interventions: Energy conservation;Therapeutic activities;Balance training;Self-care/ADL training;Therapeutic exercise      OT Goals(Current goals can  be found in the care plan section)   Acute Rehab OT Goals Patient Stated Goal: to have less pain OT Goal Formulation: With patient Time For Goal Achievement: 03/11/24 Potential to Achieve Goals: Good   OT Frequency:  Min 2X/week    Co-evaluation              AM-PAC OT "6 Clicks" Daily Activity     Outcome Measure Help from another person eating meals?: None Help from another person taking care of personal grooming?: A Lot Help from another person toileting, which includes using toliet, bedpan, or urinal?: A Lot Help from another person bathing (including washing, rinsing,  drying)?: A Lot Help from another person to put on and taking off regular upper body clothing?: None Help from another person to put on and taking off regular lower body clothing?: A Lot 6 Click Score: 16   End of Session Equipment Utilized During Treatment: Rolling walker (2 wheels) Nurse Communication: Mobility status;Patient requests pain meds  Activity Tolerance: Patient limited by pain Patient left: in bed;with call bell/phone within reach;with bed alarm set  OT Visit Diagnosis: Unsteadiness on feet (R26.81);Other abnormalities of gait and mobility (R26.89);History of falling (Z91.81);Muscle weakness (generalized) (M62.81);Pain Pain - Right/Left: Left Pain - part of body:  (Back, neck)                Time: 1415-1440 OT Time Calculation (min): 25 min Charges:  OT General Charges $OT Visit: 1 Visit OT Evaluation $OT Eval Moderate Complexity: 1 Mod  Roberto Romanoski M.S. OTR/L  02/26/24, 4:28 PM

## 2024-02-26 NOTE — Assessment & Plan Note (Signed)
-   We will continue Klonopin and Zoloft. 

## 2024-02-26 NOTE — Evaluation (Signed)
 Physical Therapy Evaluation Patient Details Name: Jonathon Snow MRN: [id number] DOB: [birth year] Today's Date: 02/26/2024  History of Present Illness  Jonathon Snow is a 63 y.o.  male with medical history significant for GERD, depression, DVT, hypertension, dyslipidemia, PE and coronary artery disease, who presented to the emergency room with acute onset of syncope.  He has been experiencing near syncope over the last few weeks.  Lightheadedness and dizziness.  Before he came to the ER and while getting out of the shower he had a syncopal episode and hit the edge of the tub with his left side.  He had subsequent left lower chest pain and right fourth finger contusion. He comes in denied any paresthesias or focal muscle weakness.  No chest pain or palpitations.  He admits to recent mild dyspnea without significant cough or wheezing.  He admits to chills without measured fever. He has be  Clinical Impression  Pt in a lot of pain from recent fall, meds helping facilitate mobility to a degree, however he is resilient in effort despite pain. Pt remains orthostatic, per his report, has been frequently orthostatic since November, symptoms include dizziness, vertigo, visual field loss, and some auditory decrease. Pt educated on how taking hot showers can exacerbate this issue via peripheral vasodilation and central perfusion volume depletion. Pt is also on several medcation which can exacerbate this issue, unclear if these will change anytime soon. Will continue to follow and advance mobility as able, however pt will need to continue to focus on long-term BP management and activity modification for safety at home. Of note: pt also seems to be having some transient vertigo/BPPV type symptoms since hitting his head in November and would likely benefit from formal BPPV assessment at some point when tolerated (likely would be painful at present).     02/26/24 0318  Therapy Vitals  Temp 98.1 F (36.7 C)  Pulse  Rate 84  Resp 18  BP 137/78  Patient Position (if appropriate) Lying  Orthostatic Lying   BP- Lying 137/78  Pulse- Lying 94  Orthostatic Sitting  BP- Sitting 128/86  Pulse- Sitting 83  Orthostatic Standing at 0 minutes  BP- Standing at 0 minutes 110/61  Pulse- Standing at 0 minutes 87  Orthostatic Standing at 3 minutes  BP- Standing at 3 minutes (!) 77/57  Pulse- Standing at 3 minutes 97  Oxygen Therapy  SpO2 100 %  O2 Device Room Air          If plan is discharge home, recommend the following: Assist for transportation;Help with stairs or ramp for entrance   Can travel by private vehicle        Equipment Recommendations None recommended by PT  Recommendations for Other Services       Functional Status Assessment Patient has had a recent decline in their functional status and demonstrates the ability to make significant improvements in function in a reasonable and predictable amount of time.     Precautions / Restrictions Precautions Precautions: Fall Restrictions Weight Bearing Restrictions Per Provider Order: No      Mobility  Bed Mobility Overal bed mobility: Needs Assistance Bed Mobility: Supine to Sit, Sit to Supine     Supine to sit: Supervision Sit to supine: Supervision   General bed mobility comments: Bed mobility; no physical assistance but painful    Transfers Overall transfer level: Needs assistance Equipment used: Rolling walker (2 wheels) Transfers: Sit to/from Stand Sit to Stand: Supervision  Ambulation/Gait Ambulation/Gait assistance: Supervision Gait Distance (Feet): 25 Feet Assistive device: Rolling walker (2 wheels)         General Gait Details: steady, slow antalgic mildly symptomaitc; still orthotatic and painful, hence did not try longer distance at this time  Stairs            Wheelchair Mobility     Tilt Bed    Modified Rankin (Stroke Patients Only)       Balance                                              Pertinent Vitals/Pain      Home Living                          Prior Function                       Extremity/Trunk Assessment                Communication        Cognition Arousal: Alert Behavior During Therapy: WFL for tasks assessed/performed   PT - Cognitive impairments: No apparent impairments                                 Cueing       General Comments      Exercises     Assessment/Plan    PT Assessment Patient needs continued PT services  PT Problem List Decreased strength;Decreased range of motion;Decreased activity tolerance;Decreased balance;Decreased mobility;Decreased knowledge of use of DME;Decreased knowledge of precautions       PT Treatment Interventions DME instruction;Gait training;Patient/family education;Stair training;Functional mobility training;Therapeutic activities;Therapeutic exercise;Balance training;Neuromuscular re-education    PT Goals (Current goals can be found in the Care Plan section)  Acute Rehab PT Goals Patient Stated Goal: improve his activity tolerance overall and stop falling PT Goal Formulation: With patient Time For Goal Achievement: 03/11/24 Potential to Achieve Goals: Fair    Frequency Min 3X/week     Co-evaluation               AM-PAC PT "6 Clicks" Mobility  Outcome Measure Help needed turning from your back to your side while in a flat bed without using bedrails?: A Little Help needed moving from lying on your back to sitting on the side of a flat bed without using bedrails?: A Little Help needed moving to and from a bed to a chair (including a wheelchair)?: A Little Help needed standing up from a chair using your arms (e.g., wheelchair or bedside chair)?: A Little Help needed to walk in hospital room?: A Lot Help needed climbing 3-5 steps with a railing? : A Lot 6 Click Score: 16    End of Session   Activity  Tolerance: Patient limited by pain;Treatment limited secondary to medical complications (Comment) (orthostatic) Patient left: in bed;with call bell/phone within reach;with bed alarm set   PT Visit Diagnosis: Difficulty in walking, not elsewhere classified (R26.2);Unsteadiness on feet (R26.81);Other abnormalities of gait and mobility (R26.89);Other symptoms and signs involving the nervous system (R29.898)    Time: 1450-1526 PT Time Calculation (min) (ACUTE ONLY): 36 min   Charges:   PT Evaluation $PT Eval High Complexity: 1 High PT Treatments $Therapeutic Activity: 8-22  mins $Self Care/Home Management: 8-22 PT General Charges $$ ACUTE PT VISIT: 1 Visit    4:20 PM, 02/26/24 Rosamaria Lints, PT, DPT Physical Therapist - [facility]  [phone] (ASCOM)     Lunna Vogelgesang C 02/26/2024, 4:15 PM

## 2024-02-26 NOTE — Assessment & Plan Note (Signed)
 -  We will continue statin therapy.

## 2024-02-27 DIAGNOSIS — R55 Syncope and collapse: Secondary | ICD-10-CM | POA: Diagnosis not present

## 2024-02-27 LAB — BASIC METABOLIC PANEL
Anion gap: 6 (ref 5–15)
BUN: 30 mg/dL — ABNORMAL HIGH (ref 8–23)
CO2: 20 mmol/L — ABNORMAL LOW (ref 22–32)
Calcium: 8.6 mg/dL — ABNORMAL LOW (ref 8.9–10.3)
Chloride: 110 mmol/L (ref 98–111)
Creatinine, Ser: 1.57 mg/dL — ABNORMAL HIGH (ref 0.61–1.24)
GFR, Estimated: 50 mL/min — ABNORMAL LOW (ref >60)
Glucose, Bld: 91 mg/dL (ref 70–99)
Potassium: 4 mmol/L (ref 3.5–5.1)
Sodium: 136 mmol/L (ref 135–145)

## 2024-02-27 LAB — CBC WITH DIFFERENTIAL/PLATELET
Abs Immature Granulocytes: 0.01 10*3/uL (ref 0.00–0.07)
Basophils Absolute: 0 10*3/uL (ref 0.0–0.1)
Basophils Relative: 0 %
Eosinophils Absolute: 0.3 10*3/uL (ref 0.0–0.5)
Eosinophils Relative: 8 %
HCT: 34.9 % — ABNORMAL LOW (ref 39.0–52.0)
Hemoglobin: 11.3 g/dL — ABNORMAL LOW (ref 13.0–17.0)
Immature Granulocytes: 0 %
Lymphocytes Relative: 19 %
Lymphs Abs: 0.7 10*3/uL (ref 0.7–4.0)
MCH: 28.2 pg (ref 26.0–34.0)
MCHC: 32.4 g/dL (ref 30.0–36.0)
MCV: 87 fL (ref 80.0–100.0)
Monocytes Absolute: 0.7 10*3/uL (ref 0.1–1.0)
Monocytes Relative: 18 %
Neutro Abs: 2 10*3/uL (ref 1.7–7.7)
Neutrophils Relative %: 55 %
Platelets: 163 10*3/uL (ref 150–400)
RBC: 4.01 MIL/uL — ABNORMAL LOW (ref 4.22–5.81)
RDW: 15.3 % (ref 11.5–15.5)
WBC: 3.6 10*3/uL — ABNORMAL LOW (ref 4.0–10.5)
nRBC: 0 % (ref 0.0–0.2)

## 2024-02-27 LAB — GLUCOSE, CAPILLARY: Glucose-Capillary: 137 mg/dL — ABNORMAL HIGH (ref 70–99)

## 2024-02-27 MED ORDER — MECLIZINE HCL 12.5 MG PO TABS: 12.5000 mg | ORAL_TABLET | Freq: Three times a day (TID) | ORAL | 0 refills | Status: AC | PRN

## 2024-02-27 NOTE — TOC Transition Note (Signed)
 Transition of Care [facility]) - Discharge Note   Patient Details  Name: Jonathon Snow MRN: [id number] Date of Birth: Mar 17, 1961  [facility] CM/SW Contact:  Bing Quarry, RN Phone Number: 02/27/2024, 10:10 AM   Clinical Narrative:  3/8: Possible DC today, pending outreach to [facility] to see more than once a week for 30 minutes as they did last discharge per provider. Contacted [facility] weekend rep, Laurelyn Sickle, who is going to check with supervisor to see why it was only once a week and if can be increased. PT/OT home health orders in EPIC.    Gabriel Cirri MSN RN CM  RN Case Manager [place]  Transitions of Care Direct Dial: [phone] (Weekends Only) [facility] Main Office Phone: [phone] [facility] Fax: [phone] [email or web address]            Patient Goals and CMS Choice            Discharge Placement                       Discharge Plan and Services Additional resources added to the After Visit Summary for                                       Social Drivers of Health (SDOH) Interventions SDOH Screenings   Food Insecurity: No Food Insecurity (02/26/2024)  Housing: Low Risk  (02/26/2024)  Transportation Needs: No Transportation Needs (02/26/2024)  Utilities: Not At Risk (02/26/2024)  Alcohol Screen: Low Risk  (10/28/2023)  Depression (PHQ2-9): Medium Risk (01/07/2024)  Social Connections: Unknown (04/25/2022)   Received from [facility], [facility]  Tobacco Use: Low Risk  (02/25/2024)     Readmission Risk Interventions     No data to display

## 2024-02-27 NOTE — Discharge Summary (Signed)
 Physician Discharge Summary  Jonathon Snow ZOX:[id number] DOB: 06-Mar-1961 DOA: 02/25/2024  PCP: Danelle Berry, PA-C  Admit date: 02/25/2024 Discharge date: 02/27/2024  Admitted From: Home Disposition:  Home w home health  Recommendations for Outpatient Follow-up:  Follow up with PCP in 1-2 weeks Ambulatory referral to neurology.  Consider referral to movement disorder specialist Ambulatory referral to EP Dr. Graciela Husbands  Home Health: Yes PT OT Equipment/Devices: RW walker  Discharge Condition: Stable CODE STATUS: Full Diet recommendation: Heart healthy  Brief/Interim Summary:   63 y.o. Caucasian male with medical history significant for GERD, depression, DVT, hypertension, dyslipidemia, PE and coronary artery disease, who presented to the emergency room with acute onset of syncope.  He has been experiencing near syncope over the last few weeks.  Lightheadedness and dizziness.  Before he came to the ER and while getting out of the shower he had a syncopal episode and hit the edge of the tub with his left side.  He had subsequent left lower chest pain and right fourth finger contusion.  He comes in denied any paresthesias or focal muscle weakness.  No chest pain or palpitations.  He admits to recent mild dyspnea without significant cough or wheezing.  He admits to chills without measured fever.  He has been having vertigo.  No nausea or vomiting or abdominal pain.     Discharge Diagnoses:  Principal Problem:   Syncope Active Problems:   Essential hypertension   Vertigo   Dyslipidemia   Anxiety and depression   History of pulmonary embolism   Syncope and collapse   BPH (benign prostatic hyperplasia)   Peripheral neuropathy Recurrent syncope Vertigo Etiology is unclear.  Could be a component of possible autonomic dysfunction.  MRI brain no acute abnormalities that could explain symptoms.  The patient is known to Dr. Okey Dupre from cardiology.  Case discussed with him at length.  Patient has been  through numerous medication titrations and seems to have quite labile blood pressures raising the concern for underlying dysautonomia. Plan: The etiology of patient's dysautonomia and orthostasis is unclear.  No medication changes recommended at this time per cardiology.  Appreciate neurology involvement as well.  Some consideration for Abilify as culprit for autonomic dysfunction and orthostasis.  Recommend discontinuation on discharge.  Also some concern that CellCept is a contributing factor.  Patient has been on this medication since at least 2013 for treatment of lupus nephritis.  Recommend returning back to nephrology prescribing physician to discuss possible alternative.  Patient will be referred to neurology as well as EP on discharge.  Discharge Instructions  Discharge Instructions     Ambulatory referral to Cardiac Electrophysiology   Complete by: As directed    Ambulatory referral to Neurology   Complete by: As directed    Diet - low sodium heart healthy   Complete by: As directed    Increase activity slowly   Complete by: As directed       Allergies as of 02/27/2024       Reactions   Enalapril Other (See Comments)   Unknown reaction   Vicodin [hydrocodone-acetaminophen] Hives, Rash   Severe headaches (also) NAME BRAND ONLY PER PT CAN TAKE GENERIC        Medication List     STOP taking these medications    albuterol 108 (90 Base) MCG/ACT inhaler Commonly known as: VENTOLIN HFA   ARIPiprazole 2 MG tablet Commonly known as: ABILIFY   clobetasol 0.05 % external solution Commonly known as: TEMOVATE   cromolyn  4 % ophthalmic solution Commonly known as: OPTICROM   nystatin cream Commonly known as: MYCOSTATIN   nystatin powder Commonly known as: MYCOSTATIN/NYSTOP   ofloxacin 0.3 % ophthalmic solution Commonly known as: Ocuflox   senna 8.6 MG Tabs tablet Commonly known as: SENOKOT   tamsulosin 0.4 MG Caps capsule Commonly known as: FLOMAX       TAKE  these medications    apixaban 5 MG Tabs tablet Commonly known as: ELIQUIS Take 1 tablet (5 mg total) by mouth 2 (two) times daily.   cholecalciferol 25 MCG (1000 UNIT) tablet Commonly known as: VITAMIN D3 Take 1,000 Units by mouth daily.   clonazePAM 0.5 MG tablet Commonly known as: KLONOPIN Take 0.5 mg by mouth daily.   clopidogrel 75 MG tablet Commonly known as: PLAVIX TAKE 1 TABLET(75 MG) BY MOUTH DAILY   gabapentin 100 MG capsule Commonly known as: NEURONTIN Take 2 capsules (200 mg total) by mouth 3 (three) times daily.   hydrALAZINE 25 MG tablet Commonly known as: APRESOLINE Take 1 tablet (25 mg total) by mouth 3 (three) times daily.   hydrOXYzine 10 MG tablet Commonly known as: ATARAX Take 1 tablet (10 mg total) by mouth every 8 (eight) hours as needed for itching.   levocetirizine 5 MG tablet Commonly known as: XYZAL Take 1 tablet (5 mg total) by mouth every evening.   losartan 50 MG tablet Commonly known as: COZAAR Take 1 tablet (50 mg total) by mouth daily.   meclizine 12.5 MG tablet Commonly known as: ANTIVERT Take 1 tablet (12.5 mg total) by mouth 3 (three) times daily as needed for dizziness.   methocarbamol 500 MG tablet Commonly known as: ROBAXIN Take 1 tablet (500 mg total) by mouth every 8 (eight) hours as needed for muscle spasms.   metoprolol succinate 25 MG 24 hr tablet Commonly known as: TOPROL-XL Take 1 tablet (25 mg total) by mouth daily.   montelukast 10 MG tablet Commonly known as: SINGULAIR Take 1 tablet (10 mg total) by mouth at bedtime.   multivitamin with minerals tablet Take 1 tablet by mouth daily.   mycophenolate 500 MG tablet Commonly known as: CELLCEPT Take 2 tablets (1,000 mg total) by mouth 2 (two) times daily. HOLD until neurosurgery is ok to restart this (waiting for incision to heal) What changed: how much to take   rosuvastatin 20 MG tablet Commonly known as: CRESTOR Take 1 tablet (20 mg total) by mouth daily.    sertraline 100 MG tablet Commonly known as: ZOLOFT Take 1.5 tablets (150 mg total) by mouth daily.   tadalafil 20 MG tablet Commonly known as: CIALIS Take 1 tablet (20 mg total) by mouth daily as needed for erectile dysfunction. Avoid taking it if low blood pressure        Allergies  Allergen Reactions   Enalapril Other (See Comments)    Unknown reaction   Vicodin [Hydrocodone-Acetaminophen] Hives and Rash    Severe headaches (also) NAME BRAND ONLY PER PT CAN TAKE GENERIC    Consultations: Neurology Cardiology   Procedures/Studies: US Carotid Bilateral Result Date: 02/27/2024 CLINICAL DATA:  Syncope 80. Hyperlipidemia. Prior MI. Visual disturbance. Hyperlipidemia. EXAM: BILATERAL CAROTID DUPLEX ULTRASOUND TECHNIQUE: Wallace Cullens scale imaging, color Doppler and duplex ultrasound were performed of bilateral carotid and vertebral arteries in the neck. COMPARISON:  CT neck 08/22/2021 FINDINGS: Criteria: Quantification of carotid stenosis is based on velocity parameters that correlate the residual internal carotid diameter with NASCET-based stenosis levels, using the diameter of the distal internal carotid lumen  as the denominator for stenosis measurement. The following velocity measurements were obtained: RIGHT ICA: 103/33 cm/sec CCA: 124/21 cm/sec SYSTOLIC ICA/CCA RATIO:  0.8 ECA: 135 cm/sec LEFT ICA: 98/35 cm/sec CCA: 142/32 cm/sec SYSTOLIC ICA/CCA RATIO:  0.7 ECA: 138 cm/sec RIGHT CAROTID ARTERY: Mild calcified plaque of the right ICA origin. RIGHT VERTEBRAL ARTERY:  Antegrade flow. LEFT CAROTID ARTERY: Mild calcified plaque of the left internal carotid artery origin. LEFT VERTEBRAL ARTERY:  Antegrade flow. IMPRESSION: Mild calcified plaque of the internal carotid artery origins with velocity parameters indicative of less than 50% stenosis. Electronically Signed   By: Acquanetta Belling M.D.   On: 02/27/2024 06:09   MR ANGIO HEAD WO CONTRAST Result Date: 02/27/2024 CLINICAL DATA:  Stroke/TIA,  determine embolic source EXAM: MRA HEAD WITHOUT CONTRAST TECHNIQUE: Angiographic images of the Circle of Willis were acquired using MRA technique without intravenous contrast. COMPARISON:  None Available. FINDINGS: Anterior circulation: Bilateral intracranial ICAs, MCAs and ACAs are patent without proximal hemodynamically significant stenosis. No aneurysm identified. Posterior circulation: Bilateral intradural vertebral arteries, basilar artery and bilateral posterior cerebral arteries are patent. Severe left P2 PCA stenosis. Prominent posterior communicating arteries bilaterally with small vertebrobasilar system, anatomic variant. IMPRESSION: 1. No large vessel occlusion. 2. Severe left P2 PCA stenosis. Electronically Signed   By: Feliberto Harts M.D.   On: 02/27/2024 02:49   MR CERVICAL SPINE WO CONTRAST Result Date: 02/27/2024 CLINICAL DATA:  Myelopathy, acute, cervical spine EXAM: MRI CERVICAL SPINE WITHOUT CONTRAST TECHNIQUE: Multiplanar, multisequence MR imaging of the cervical spine was performed. No intravenous contrast was administered. COMPARISON:  CT of the cervical spine November 05, 2023. FINDINGS: Alignment: No substantial sagittal subluxation. Vertebrae: No evidence of acute fracture, evidence of discitis, or suspicious bone lesion. Benign vertebral venous malformation at T3. Mild T1-T2 height loss, unchanged. Cord: Normal cord signal.  Neck Posterior Fossa, vertebral arteries, paraspinal tissues: Visualized vertebral artery flow voids are maintained. No paraspinal edema. Disc levels: C2-C3: No significant disc protrusion, foraminal stenosis, or canal stenosis. C3-C4: No significant disc protrusion, foraminal stenosis, or canal stenosis. C4-C5: Right eccentric posterior disc osteophyte complex with right greater than left facet and uncovertebral hypertrophy. Resulting mild right greater than left foraminal stenosis. Mild canal stenosis. C5-C6: Posterior disc osteophyte complex ligamentum flavum  thickening and right greater than left facet and uncovertebral hypertrophy. Resulting mild-to-moderate bilateral foraminal stenosis. Mild to moderate canal stenosis. C6-C7: Posterior disc osteophyte complex with bilateral facet and uncovertebral hypertrophy. Resulting moderate bilateral foraminal stenosis. Mild canal stenosis. C7-T1: No significant disc protrusion, foraminal stenosis, or canal stenosis. IMPRESSION: 1. 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 2. At C5-C6, mild to moderate canal and bilateral foraminal stenosis. 3. At C4-C5, mild canal and bilateral foraminal stenosis. Electronically Signed   By: Feliberto Harts M.D.   On: 02/27/2024 02:42   MR BRAIN WO CONTRAST Result Date: 02/26/2024 CLINICAL DATA:  63 year old male neurologic deficit, syncope, fluctuations in blood pressure. EXAM: MRI HEAD WITHOUT CONTRAST TECHNIQUE: Multiplanar, multiecho pulse sequences of the brain and surrounding structures were obtained without intravenous contrast. COMPARISON:  Brain MRI 11/23/2023. Head CT yesterday. FINDINGS: Brain: No restricted diffusion to suggest acute infarction. No midline shift, mass effect, evidence of mass lesion, ventriculomegaly, extra-axial collection or acute intracranial hemorrhage. Mega cisterna magna, normal variant. Cervicomedullary junction is within normal limits. Negative pituitary. Wallace Cullens and white matter signal is stable since December, and largely normal for age. No convincing cortical encephalomalacia. But there is evidence of a solitary chronic microhemorrhage in the  left corona radiata on SWI, stable. Vascular: Major intracranial vascular flow voids are stable. Skull and upper cervical spine: Negative for age visible cervical spine. Visualized bone marrow signal is within normal limits. Sinuses/Orbits: Negative orbits. Paranasal sinuses are stable, well aerated overall. Other: Mastoids are clear. Grossly normal visible internal auditory structures.  Chronically nodular, heterogeneous appearance of the bilateral parotid and salivary glands. IMPRESSION: 1. No acute intracranial abnormality. 2. Normal for age noncontrast MRI appearance of the Brain aside from solitary chronic microhemorrhage in the left corona radiata. 3. Chronically nodular, heterogeneous appearance of the bilateral parotid and salivary glands, suggesting chronic/recurrent salivary inflammation such as due to Sjogren syndrome or other systemic inflammatory process. Electronically Signed   By: Odessa Fleming M.D.   On: 02/26/2024 04:32   DG Hand Complete Right Result Date: 02/25/2024 CLINICAL DATA:  Fall with fourth digit pain EXAM: RIGHT HAND - COMPLETE 3+ VIEW COMPARISON:  None Available. FINDINGS: No malalignment. Chronic appearing fracture deformity of the fifth metacarpal. Soft tissues are unremarkable IMPRESSION: No acute osseous abnormality. Chronic appearing fifth metacarpal fracture Electronically Signed   By: Jasmine Pang M.D.   On: 02/25/2024 23:12   CT ABDOMEN PELVIS W CONTRAST Result Date: 02/25/2024 CLINICAL DATA:  Trauma syncopal episode EXAM: CT ABDOMEN AND PELVIS WITH CONTRAST TECHNIQUE: Multidetector CT imaging of the abdomen and pelvis was performed using the standard protocol following bolus administration of intravenous contrast. RADIATION DOSE REDUCTION: This exam was performed according to the departmental dose-optimization program which includes automated exposure control, adjustment of the mA and/or kV according to patient size and/or use of iterative reconstruction technique. CONTRAST:  OMNIPAQUE IOHEXOL 350 MG/ML SOLN COMPARISON:  11/05/2023, 03/17/2018 FINDINGS: Lower chest: Lung bases demonstrate scarring at the peripheral right base. Coronary vascular calcification. Small hiatal hernia Hepatobiliary: Chronic cystic and calcified lesion in the right hepatic lobe. No calcified gallstone or biliary dilatation. Pancreas: Unremarkable. No pancreatic ductal dilatation  or surrounding inflammatory changes. Spleen: Normal in size without focal abnormality. Adrenals/Urinary Tract: Adrenal glands are normal. Kidneys show no hydronephrosis. Subcentimeter hypodensities too small to further characterize, no imaging follow-up is recommended. Bladder is unremarkable Stomach/Bowel: Stomach is within normal limits. Appendix appears normal. No evidence of bowel wall thickening, distention, or inflammatory changes. Probable pill fragments in the right colon. Diverticular disease of the left colon. Vascular/Lymphatic: Aortic atherosclerosis. No enlarged abdominal or pelvic lymph nodes. Reproductive: Prostate is unremarkable. Other: Negative for pelvic effusion or free air. Small fat containing umbilical hernia Musculoskeletal: Threaded screws in the proximal left femur with artifact. Posterior spinal rods and fixating screws to the level of T12. Treated compression deformities of the thoracic spine and at L4. Mild chronic superior endplate deformities at L1, L2 and L3. Left posterior spinal fusion hardware at L5-S1 with additional anterior fusion hardware. Soft tissue stranding within the left lower quadrant abdominal wall soft tissues consistent with contusion IMPRESSION: 1. No CT evidence for acute intra-abdominal or pelvic abnormality. 2. Soft tissue stranding within the left lower quadrant anterior abdominal wall soft tissues consistent with a contusion 3. Multiple chronic findings as discussed above Electronically Signed   By: Jasmine Pang M.D.   On: 02/25/2024 23:11   CT Angio Chest PE W and/or Wo Contrast Result Date: 02/25/2024 CLINICAL DATA:  Left rib pain fall syncope EXAM: CT ANGIOGRAPHY CHEST WITH CONTRAST TECHNIQUE: Multidetector CT imaging of the chest was performed using the standard protocol during bolus administration of intravenous contrast. Multiplanar CT image reconstructions and MIPs were obtained to evaluate  the vascular anatomy. RADIATION DOSE REDUCTION: This exam was  performed according to the departmental dose-optimization program which includes automated exposure control, adjustment of the mA and/or kV according to patient size and/or use of iterative reconstruction technique. CONTRAST:  OMNIPAQUE IOHEXOL 350 MG/ML SOLN COMPARISON:  Chest CT 01/20/2024 FINDINGS: Cardiovascular: Satisfactory opacification of the pulmonary arteries to the segmental level. No evidence of pulmonary embolism. Nonaneurysmal aorta. No dissection. Mild atherosclerosis. Coronary vascular calcification. Normal cardiac size. No pericardial effusion Mediastinum/Nodes: Patent trachea. No thyroid mass. No suspicious lymph nodes. Esophagus shows small hiatal hernia Lungs/Pleura: Lungs are clear. No pleural effusion or pneumothorax. Upper Abdomen: See separately dictated abdominopelvic CT Musculoskeletal: Posterior spinal fusion hardware from T7, inferior extent incompletely included. Treated compression deformities at T7, T8, T11 and T12. Chronic compression deformities at T1, T2 and T3. Review of the MIP images confirms the above findings. IMPRESSION: 1. Negative for acute pulmonary embolism or aortic dissection 2. Clear lung fields Electronically Signed   By: Jasmine Pang M.D.   On: 02/25/2024 23:01   CT Cervical Spine Wo Contrast Result Date: 02/25/2024 CLINICAL DATA:  Trauma syncope EXAM: CT CERVICAL SPINE WITHOUT CONTRAST TECHNIQUE: Multidetector CT imaging of the cervical spine was performed without intravenous contrast. Multiplanar CT image reconstructions were also generated. RADIATION DOSE REDUCTION: This exam was performed according to the departmental dose-optimization program which includes automated exposure control, adjustment of the mA and/or kV according to patient size and/or use of iterative reconstruction technique. COMPARISON:  CT 11/05/2023 FINDINGS: Alignment: Stable alignment. No subluxation. Facet alignment is normal Skull base and vertebrae: Mild chronic compression  deformities at T1, T2 and T3. No acute fracture is seen. Soft tissues and spinal canal: No prevertebral fluid or swelling. No visible canal hematoma. Disc levels:  Moderate multilevel degenerative changes. Upper chest: Multiple punctate parotid calcifications with atrophy of the salivary gland, correlate for any history of inflammatory or autoimmune disease. Lung apices are clear Other: None IMPRESSION: Degenerative changes.  No acute osseous abnormality. Electronically Signed   By: Jasmine Pang M.D.   On: 02/25/2024 22:54   CT HEAD WO CONTRAST Result Date: 02/25/2024 CLINICAL DATA:  Syncopal episode. EXAM: CT HEAD WITHOUT CONTRAST TECHNIQUE: Contiguous axial images were obtained from the base of the skull through the vertex without intravenous contrast. RADIATION DOSE REDUCTION: This exam was performed according to the departmental dose-optimization program which includes automated exposure control, adjustment of the mA and/or kV according to patient size and/or use of iterative reconstruction technique. COMPARISON:  November 23, 2023 FINDINGS: Brain: No evidence of acute infarction, hemorrhage, hydrocephalus, extra-axial collection or mass lesion/mass effect. 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 Vascular: Marked severity calcification of the bilateral cavernous carotid arteries is noted. Skull: Normal. Negative for fracture or focal lesion. Sinuses/Orbits: No acute finding. Other: None. IMPRESSION: No acute intracranial abnormality. Electronically Signed   By: Aram Candela M.D.   On: 02/25/2024 19:14   VAS [place] LOWER EXTREMITY ARTERIAL DUPLEX Result Date: 02/05/2024 LOWER EXTREMITY ARTERIAL DUPLEX STUDY Patient Name:  LAFAYETTE DUNLEVY  Date of Exam:   02/02/2024 Medical Rec #: [id number]       Accession #:    [id number] Date of Birth: [birth year]      Patient Gender: M Patient Age:   7 years Exam Location:  [facility] Procedure:      VAS [place] LOWER  EXTREMITY ARTERIAL DUPLEX Referring Phys: Sheppard Plumber --------------------------------------------------------------------------------  Indications: Claudication. High Risk Factors:  Hypertension, hyperlipidemia, no history of smoking. Other Factors: Multiple back issues.  Current ABI: Contraindicated secondary to recent DVT Performing Technologist: Hardie Lora RVT  Examination Guidelines: A complete evaluation includes B-mode imaging, spectral Doppler, color Doppler, and power Doppler as needed of all accessible portions of each vessel. Bilateral testing is considered an integral part of a complete examination. Limited examinations for reoccurring indications may be performed as noted.  +----------+--------+-----+--------+---------+---------------------------------+ LEFT      PSV cm/sRatioStenosisWaveform Comments                          +----------+--------+-----+--------+---------+---------------------------------+ CFA [id number]                  triphasicRecanalized thrombus of                                                   undetermined age at the [facility]       +----------+--------+-----+--------+---------+---------------------------------+ DFA       74                   biphasic                                   +----------+--------+-----+--------+---------+---------------------------------+ SFA Prox  73                   triphasic                                  +----------+--------+-----+--------+---------+---------------------------------+ SFA Mid   100                  triphasic                                  +----------+--------+-----+--------+---------+---------------------------------+ SFA Distal85                   triphasic                                  +----------+--------+-----+--------+---------+---------------------------------+ POP Prox  68                   triphasicRecanalized thrombus of                                                    undetermined age at the popliteal                                         vein                              +----------+--------+-----+--------+---------+---------------------------------+ POP Distal62                   triphasic                                  +----------+--------+-----+--------+---------+---------------------------------+  ATA Distal187                  triphasicRetrograde flow is consistent                                             with proximal obstruction         +----------+--------+-----+--------+---------+---------------------------------+ PTA Distal83                   triphasic                                  +----------+--------+-----+--------+---------+---------------------------------+  Summary: Left: Thrombus of undetermined age seen at CFV and popliteal vein. Retrograde flow in anterior tibial may suggest proximal obstruction. No obvious arterial occlusive disease identified.  See table(s) above for measurements and observations. Electronically signed by Festus Barren MD on 02/05/2024 at 9:48:37 AM.    Final       Subjective: Seen and examined on the day of discharge.  Stable no distress.  Appropriate for discharge home.  Discharge Exam: Vitals:   02/27/24 0817 02/27/24 0820  BP: (!) 107/91 109/64  Pulse: 91 100  Resp: 19 19  Temp:    SpO2: 96% 94%   Vitals:   02/27/24 0559 02/27/24 0813 02/27/24 0817 02/27/24 0820  BP: 104/62 (!) 159/81 (!) 107/91 109/64  Pulse: 90 88 91 100  Resp: 16 18 19 19   Temp: 98 F (36.7 C) 98.5 F (36.9 C)    TempSrc:      SpO2: 96% 97% 96% 94%  Weight:      Height:        General: Pt is alert, awake, not in acute distress Cardiovascular: RRR, S1/S2 +, no rubs, no gallops Respiratory: CTA bilaterally, no wheezing, no rhonchi Abdominal: Soft, NT, ND, bowel sounds + Extremities: no edema, no cyanosis    The results of significant diagnostics from this hospitalization (including  imaging, microbiology, ancillary and laboratory) are listed below for reference.     Microbiology: No results found for this or any previous visit (from the past 240 hours).   Labs: BNP (last 3 results) Recent Labs    11/23/23 1505  BNP 23.1   Basic Metabolic Panel: Recent Labs  Lab 02/25/24 1608 02/26/24 0501 02/27/24 0807  NA 137 137 136  K 4.0 3.5 4.0  CL 108 109 110  CO2 19* 18* 20*  GLUCOSE 107* 145* 91  BUN 26* 28* 30*  CREATININE 1.38* 1.61* 1.57*  CALCIUM 9.0 9.0 8.6*   Liver Function Tests: No results for input(s): "AST", "ALT", "ALKPHOS", "BILITOT", "PROT", "ALBUMIN" in the last 168 hours. No results for input(s): "LIPASE", "AMYLASE" in the last 168 hours. No results for input(s): "AMMONIA" in the last 168 hours. CBC: Recent Labs  Lab 02/25/24 1608 02/26/24 0501 02/27/24 0807  WBC 3.8* 3.4* 3.6*  NEUTROABS  --   --  2.0  HGB 13.0 11.3* 11.3*  HCT 40.3 35.0* 34.9*  MCV 89.8 88.2 87.0  PLT 180 158 163   Cardiac Enzymes: No results for input(s): "CKTOTAL", "CKMB", "CKMBINDEX", "TROPONINI" in the last 168 hours. BNP: Invalid input(s): "POCBNP" CBG: Recent Labs  Lab 02/25/24 1905 02/26/24 0403 02/27/24 0903  GLUCAP 87 136* 137*   D-Dimer No results for input(s): "DDIMER" in the last 72 hours. Hgb A1c  No results for input(s): "HGBA1C" in the last 72 hours. Lipid Profile No results for input(s): "CHOL", "HDL", "LDLCALC", "TRIG", "CHOLHDL", "LDLDIRECT" in the last 72 hours. Thyroid function studies No results for input(s): "TSH", "T4TOTAL", "T3FREE", "THYROIDAB" in the last 72 hours.  Invalid input(s): "FREET3" Anemia work up No results for input(s): "VITAMINB12", "FOLATE", "FERRITIN", "TIBC", "IRON", "RETICCTPCT" in the last 72 hours. Urinalysis    Component Value Date/Time   COLORURINE YELLOW (A) 02/25/2024 1939   APPEARANCEUR CLEAR (A) 02/25/2024 1939   APPEARANCEUR Clear 03/25/2023 0833   LABSPEC 1.012 02/25/2024 1939   LABSPEC 1.013  04/10/2015 1036   PHURINE 5.0 02/25/2024 1939   GLUCOSEU NEGATIVE 02/25/2024 1939   GLUCOSEU Negative 04/10/2015 1036   HGBUR NEGATIVE 02/25/2024 1939   BILIRUBINUR NEGATIVE 02/25/2024 1939   BILIRUBINUR Negative 03/25/2023 0833   BILIRUBINUR Negative 04/10/2015 1036   KETONESUR NEGATIVE 02/25/2024 1939   PROTEINUR 30 (A) 02/25/2024 1939   UROBILINOGEN 0.2 07/23/2021 1423   UROBILINOGEN 0.2 03/17/2015 0025   NITRITE NEGATIVE 02/25/2024 1939   LEUKOCYTESUR NEGATIVE 02/25/2024 1939   LEUKOCYTESUR Negative 04/10/2015 1036   Sepsis Labs Recent Labs  Lab 02/25/24 1608 02/26/24 0501 02/27/24 0807  WBC 3.8* 3.4* 3.6*   Microbiology No results found for this or any previous visit (from the past 240 hours).   Time coordinating discharge: Over 30 minutes  SIGNED:   Tresa Moore, MD  [facility] 02/27/2024, 11:59 AM Pager   If 7PM-7AM, please contact night-coverage

## 2024-02-27 NOTE — Plan of Care (Signed)
  Problem: Education: Goal: Knowledge of condition and prescribed therapy will improve Outcome: Progressing   Problem: Cardiac: Goal: Will achieve and/or maintain adequate cardiac output Outcome: Progressing   Problem: Physical Regulation: Goal: Complications related to the disease process, condition or treatment will be avoided or minimized Outcome: Progressing   Problem: Education: Goal: Knowledge of General Education information will improve Description: Including pain rating scale, medication(s)/side effects and non-pharmacologic comfort measures Outcome: Progressing   Problem: Clinical Measurements: Goal: Ability to maintain clinical measurements within normal limits will improve Outcome: Progressing

## 2024-02-27 NOTE — Progress Notes (Signed)
 Patient is alert and oriented X 4. Discharge instructions given. No any questions at this time. Peripheral I/v removed.

## 2024-02-27 NOTE — Progress Notes (Signed)
 Chart review note  Following up on imaging recommendations from yesterday  Carotid Dopplers-less than 50% stenosis of mild calcified plaque of the internal carotid artery origins bilaterally.  MRI of the cervical spine moderate bilateral foraminal stenosis and mild canal stenosis at C6-7.  Mild to moderate canal and bilateral foraminal stenosis at C5-6 and C4-5.  MR angio of the head reveals no large vessel occlusion.  Reveals severe left P2 PCA stenosis.  None of the causes above are very clearly causative for his symptoms.  I think he needs continuing outpatient workup-specifically with a movement disorder specialist to figure out if there is any true underlying parkinsonian features leading to dysautonomia-that may need further workup and management.  May need consideration for further imaging modalities such as a DaTscan an outpatient.  Plan was relayed to Dr. Georgeann Oppenheim  -- Milon Dikes, MD Neurology

## 2024-03-01 ENCOUNTER — Encounter: Payer: Self-pay | Admitting: Physician Assistant

## 2024-03-01 ENCOUNTER — Ambulatory Visit: Admitting: Physician Assistant

## 2024-03-01 VITALS — BP 133/89 | HR 77 | Temp 97.3°F | Ht 74.0 in | Wt 225.0 lb

## 2024-03-01 DIAGNOSIS — N401 Enlarged prostate with lower urinary tract symptoms: Secondary | ICD-10-CM | POA: Diagnosis not present

## 2024-03-01 DIAGNOSIS — R350 Frequency of micturition: Secondary | ICD-10-CM

## 2024-03-01 LAB — URINALYSIS, COMPLETE
Bilirubin, UA: NEGATIVE
Glucose, UA: NEGATIVE
Ketones, UA: NEGATIVE
Leukocytes,UA: NEGATIVE
Nitrite, UA: NEGATIVE
RBC, UA: NEGATIVE
Specific Gravity, UA: 1.015 (ref 1.005–1.030)
Urobilinogen, Ur: 0.2 mg/dL (ref 0.2–1.0)
pH, UA: 5.5 (ref 5.0–7.5)

## 2024-03-01 LAB — MICROSCOPIC EXAMINATION

## 2024-03-01 LAB — BLADDER SCAN AMB NON-IMAGING

## 2024-03-01 MED ORDER — SOLIFENACIN SUCCINATE 5 MG PO TABS: 5.0000 mg | ORAL_TABLET | Freq: Every day | ORAL | 11 refills | Status: DC

## 2024-03-01 MED ORDER — SILODOSIN 8 MG PO CAPS: 8.0000 mg | ORAL_CAPSULE | Freq: Every day | ORAL | 11 refills | Status: DC

## 2024-03-01 NOTE — Progress Notes (Unsigned)
 03/01/2024 3:18 PM   Jonathon Snow 15-Dec-1961 [id number]  CC: Chief Complaint  Patient presents with   Follow-up   HPI: Jonathon Snow is a 63 y.o. male with PMH BPH with LUTS refractory to Flomax and oxybutynin XL 15 mg and ED on tadalafil who presents today for evaluation of urinary frequency.   Today he reports he is off his urologic medications.  He describes bothersome nocturia x 2-3, nocturnal enuresis, and postvoid dribbling during the daytime.  He was previously recommended to consider UroLift by Dr. Apolinar Junes, but he would rather resume pharmacotherapy for now.  IPSS 20/unhappy as below.  In-office UA today positive for 2+ protein; urine microscopy pan negative. PVR 0mL.   IPSS     Row Name 03/01/24 1400         International Prostate Symptom Score   How often have you had the sensation of not emptying your bladder? About half the time     How often have you had to urinate less than every two hours? More than half the time     How often have you found you stopped and started again several times when you urinated? About half the time     How often have you found it difficult to postpone urination? Less than half the time     How often have you had a weak urinary stream? About half the time     How often have you had to strain to start urination? About half the time     How many times did you typically get up at night to urinate? 2 Times     Total IPSS Score 20       Quality of Life due to urinary symptoms   If you were to spend the rest of your life with your urinary condition just the way it is now how would you feel about that? 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   Overview:  superior sagittal  sinus, left transverse sinus and cortical veins    Closed left hip fracture, initial encounter (HCC) 02/18/2023   COVID-19 virus infection 12/2020   Depression    DVT (deep venous thrombosis) (HCC)    Dyspnea    GERD (gastroesophageal reflux disease)    Head injury 11/05/2023   Heart attack (HCC) 11/05/2023   Heel spur, left 02/16/2019   Heel spur, right 02/16/2019   Hematoma of groin 11/08/2023   Herpes zoster infection of lumbar region 02/20/2020   History of DVT (deep vein thrombosis) 03/22/2020   History of kidney stones    Hyperlipidemia    Hypertension    Long term current use of systemic steroids 11/08/2020   Lupus    Lymphedema 10/07/2018   Morbid obesity (HCC)    Opiate abuse, episodic (HCC) 02/26/2018   Osteoporosis    Pain and swelling of right lower extremity 02/03/2023   Pneumonia    PONV (postoperative nausea and vomiting)    Postphlebitic syndrome with ulcer, 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   Presence of IVC filter 03/22/2020   Removed   Pulmonary embolism (HCC)    Renal disorder    Stage III   Severe episode of recurrent  major depressive disorder, 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   Shock circulatory (HCC) 11/06/2023   STEMI (ST elevation myocardial infarction) (HCC) 11/05/2023    Surgical History: Past Surgical History:  Procedure Laterality Date   ANKLE SURGERY Right    BACK SURGERY     BRONCHIAL WASHINGS N/A 11/01/2021   Procedure: BRONCHIAL WASHINGS;  Surgeon: Vida Rigger, MD;  Location: [facility] ORS;  Service: Thoracic;  Laterality: N/A;   COLONOSCOPY WITH PROPOFOL N/A 05/28/2020   Procedure: COLONOSCOPY WITH PROPOFOL;  Surgeon: Wyline Mood, MD;  Location: [facility] ENDOSCOPY;  Service: Endoscopy;  Laterality: N/A;   COLONOSCOPY WITH PROPOFOL N/A 07/01/2023   Procedure: COLONOSCOPY WITH PROPOFOL;  Surgeon: Wyline Mood, MD;  Location: [facility]COPY;  Service: Gastroenterology;  Laterality: N/A;   CORONARY ULTRASOUND/IVUS N/A 11/05/2023   Procedure:  Coronary Ultrasound/IVUS;  Surgeon: Yvonne Kendall, MD;  Location: [facility] INVASIVE CV LAB;  Service: Cardiovascular;  Laterality: N/A;   CORONARY/GRAFT ACUTE MI REVASCULARIZATION N/A 11/05/2023   Procedure: Coronary/Graft Acute MI Revascularization;  Surgeon: Yvonne Kendall, MD;  Location: [facility] INVASIVE CV LAB;  Service: Cardiovascular;  Laterality: N/A;   CYST EXCISION  92 or 93    Liver cyst removal [facility]   FLEXIBLE BRONCHOSCOPY N/A 11/01/2021   Procedure: FLEXIBLE BRONCHOSCOPY;  Surgeon: Vida Rigger, MD;  Location: [facility] ORS;  Service: Thoracic;  Laterality: N/A;   HIP PINNING,CANNULATED Left 02/19/2023   Procedure: PERCUTANEOUS FIXATION OF FEMORAL NECK;  Surgeon: Signa Kell, MD;  Location: [facility] ORS;  Service: Orthopedics;  Laterality: Left;   I & D EXTREMITY Right 04/29/2017   Procedure: IRRIGATION AND DEBRIDEMENT EXTREMITY;  Surgeon: Ricarda Frame, MD;  Location: [facility] ORS;  Service: General;  Laterality: Right;   IRRIGATION AND DEBRIDEMENT ABSCESS Left 04/29/2017   Procedure: IRRIGATION AND DEBRIDEMENT Scrotal ABSCESS;  Surgeon: Ricarda Frame, MD;  Location: [facility] ORS;  Service: General;  Laterality: Left;   LEFT HEART CATH AND CORONARY ANGIOGRAPHY N/A 11/05/2023   Procedure: LEFT HEART CATH AND CORONARY ANGIOGRAPHY;  Surgeon: Yvonne Kendall, MD;  Location: [facility] INVASIVE CV LAB;  Service: Cardiovascular;  Laterality: N/A;   POLYPECTOMY  07/01/2023   Procedure: POLYPECTOMY;  Surgeon: Wyline Mood, MD;  Location: [facility] ENDOSCOPY;  Service: Gastroenterology;;    Home Medications:  Allergies as of 03/01/2024       Reactions   Enalapril Other (See Comments)   Unknown reaction   Vicodin [hydrocodone-acetaminophen] Hives, Rash   Severe headaches (also) NAME BRAND ONLY PER PT CAN TAKE GENERIC        Medication List        Accurate as of March 01, 2024  3:18 PM. If you have any questions, ask your nurse or doctor.          STOP taking these medications    methocarbamol  500 MG tablet Commonly known as: ROBAXIN Stopped by: Carman Ching   montelukast 10 MG tablet Commonly known as: SINGULAIR Stopped by: Carman Ching       TAKE these medications    apixaban 5 MG Tabs tablet Commonly known as: ELIQUIS Take 1 tablet (5 mg total) by mouth 2 (two) times daily.   cholecalciferol 25 MCG (1000 UNIT) tablet Commonly known as: VITAMIN D3 Take 1,000 Units by mouth daily.   clonazePAM 0.5 MG tablet Commonly known as: KLONOPIN Take 0.5 mg by mouth daily.   clopidogrel 75 MG tablet Commonly known as: PLAVIX TAKE 1 TABLET(75 MG) BY MOUTH DAILY   gabapentin 100 MG capsule Commonly known as: NEURONTIN Take 2 capsules (  200 mg total) by mouth 3 (three) times daily.   hydrALAZINE 25 MG tablet Commonly known as: APRESOLINE Take 1 tablet (25 mg total) by mouth 3 (three) times daily.   hydrOXYzine 10 MG tablet Commonly known as: ATARAX Take 1 tablet (10 mg total) by mouth every 8 (eight) hours as needed for itching.   levocetirizine 5 MG tablet Commonly known as: XYZAL Take 1 tablet (5 mg total) by mouth every evening.   losartan 50 MG tablet Commonly known as: COZAAR Take 1 tablet (50 mg total) by mouth daily.   meclizine 12.5 MG tablet Commonly known as: ANTIVERT Take 1 tablet (12.5 mg total) by mouth 3 (three) times daily as needed for dizziness.   metoprolol succinate 25 MG 24 hr tablet Commonly known as: TOPROL-XL Take 1 tablet (25 mg total) by mouth daily.   multivitamin with minerals tablet Take 1 tablet by mouth daily.   mycophenolate 500 MG tablet Commonly known as: CELLCEPT Take 2 tablets (1,000 mg total) by mouth 2 (two) times daily. HOLD until neurosurgery is ok to restart this (waiting for incision to heal) What changed: how much to take   rosuvastatin 20 MG tablet Commonly known as: CRESTOR Take 1 tablet (20 mg total) by mouth daily.   sertraline 100 MG tablet Commonly known as: ZOLOFT Take 1.5 tablets  (150 mg total) by mouth daily.   silodosin 8 MG Caps capsule Commonly known as: RAPAFLO Take 1 capsule (8 mg total) by mouth daily with breakfast. Started by: Carman Ching   solifenacin 5 MG tablet Commonly known as: VESICARE Take 1 tablet (5 mg total) by mouth daily. Started by: Carman Ching   tadalafil 20 MG tablet Commonly known as: CIALIS Take 1 tablet (20 mg total) by mouth daily as needed for erectile dysfunction. Avoid taking it if low blood pressure        Allergies:  Allergies  Allergen Reactions   Enalapril Other (See Comments)    Unknown reaction   Vicodin [Hydrocodone-Acetaminophen] Hives and Rash    Severe headaches (also) NAME BRAND ONLY PER PT CAN TAKE GENERIC    Family History: Family History  Problem Relation Age of Onset   Hypertension Father    Heart disease Father    Clotting disorder Mother    Kidney disease Brother    Heart attack Maternal Grandmother    Heart attack Maternal Grandfather    Heart attack Paternal Grandfather     Social History:   reports that he has never smoked. He has never used smokeless tobacco. He reports that he does not drink alcohol and does not use drugs.  Physical Exam: BP 133/89   Pulse 77   Temp (!) 97.3 F (36.3 C) (Oral)   Ht 6\' 2"  (1.88 m)   Wt 225 lb (102.1 kg)   BMI 28.89 kg/m   Constitutional:  Alert and oriented, no acute distress, nontoxic appearing HEENT: [place], AT Cardiovascular: No clubbing, cyanosis, or edema Respiratory: Normal respiratory effort, no increased work of breathing Skin: No rashes, bruises or suspicious lesions Neurologic: Grossly intact, no focal deficits, moving all 4 extremities Psychiatric: Normal mood and affect  Laboratory Data: Results for orders placed or performed in visit on 03/01/24  Microscopic Examination   Collection Time: 03/01/24  2:38 PM   Urine  Result Value Ref Range   WBC, UA 0-5 0 - 5 /hpf   RBC, Urine 0-2 0 - 2 /hpf   Epithelial Cells  (non renal) 0-10 0 - 10 /  hpf   Casts Present (A) None seen /lpf   Cast Type Hyaline casts N/A   Bacteria, UA Few None seen/Few  Urinalysis, Complete   Collection Time: 03/01/24  2:38 PM  Result Value Ref Range   Specific Gravity, UA 1.015 1.005 - 1.030   pH, UA 5.5 5.0 - 7.5   Color, UA Yellow Yellow   Appearance Ur Clear Clear   Leukocytes,UA Negative Negative   Protein,UA 2+ (A) Negative/Trace   Glucose, UA Negative Negative   Ketones, UA Negative Negative   RBC, UA Negative Negative   Bilirubin, UA Negative Negative   Urobilinogen, Ur 0.2 0.2 - 1.0 mg/dL   Nitrite, UA Negative Negative   Microscopic Examination See below:   BLADDER SCAN AMB NON-IMAGING   Collection Time: 03/01/24  2:58 PM  Result Value Ref Range   Scan Result 0ml    *Note: Due to a large number of results and/or encounters for the requested time period, some results have not been displayed. A complete set of results can be found in Results Review.   Assessment & Plan:   1. Benign prostatic hyperplasia with urinary frequency (Primary) Bothersome voiding symptoms off pharmacotherapy.  He prefers to resume this and defer consideration of UroLift for now.  Will start Rapaflo and Vesicare and see him back in 6 weeks for symptom recheck. - Urinalysis, Complete - BLADDER SCAN AMB NON-IMAGING - silodosin (RAPAFLO) 8 MG CAPS capsule; Take 1 capsule (8 mg total) by mouth daily with breakfast.  Dispense: 30 capsule; Refill: 11 - solifenacin (VESICARE) 5 MG tablet; Take 1 tablet (5 mg total) by mouth daily.  Dispense: 30 tablet; Refill: 11  Return in about 6 weeks (around 04/12/2024) for Symptom recheck, IPSS, PVR.  Carman Ching, PA-C  [facility] [place] [address] [place], [name] [address] [phone]

## 2024-03-02 DIAGNOSIS — I130 Hypertensive heart and chronic kidney disease with heart failure and stage 1 through stage 4 chronic kidney disease, or unspecified chronic kidney disease: Secondary | ICD-10-CM | POA: Diagnosis not present

## 2024-03-02 DIAGNOSIS — I5032 Chronic diastolic (congestive) heart failure: Secondary | ICD-10-CM | POA: Diagnosis not present

## 2024-03-02 DIAGNOSIS — Z7901 Long term (current) use of anticoagulants: Secondary | ICD-10-CM | POA: Diagnosis not present

## 2024-03-02 DIAGNOSIS — N1831 Chronic kidney disease, stage 3a: Secondary | ICD-10-CM | POA: Diagnosis not present

## 2024-03-02 DIAGNOSIS — Z955 Presence of coronary angioplasty implant and graft: Secondary | ICD-10-CM | POA: Diagnosis not present

## 2024-03-02 DIAGNOSIS — G8929 Other chronic pain: Secondary | ICD-10-CM | POA: Diagnosis not present

## 2024-03-02 DIAGNOSIS — N179 Acute kidney failure, unspecified: Secondary | ICD-10-CM | POA: Diagnosis not present

## 2024-03-02 DIAGNOSIS — N401 Enlarged prostate with lower urinary tract symptoms: Secondary | ICD-10-CM | POA: Diagnosis not present

## 2024-03-02 DIAGNOSIS — Z48812 Encounter for surgical aftercare following surgery on the circulatory system: Secondary | ICD-10-CM | POA: Diagnosis not present

## 2024-03-02 DIAGNOSIS — M810 Age-related osteoporosis without current pathological fracture: Secondary | ICD-10-CM | POA: Diagnosis not present

## 2024-03-02 DIAGNOSIS — M329 Systemic lupus erythematosus, unspecified: Secondary | ICD-10-CM | POA: Diagnosis not present

## 2024-03-02 DIAGNOSIS — N39498 Other specified urinary incontinence: Secondary | ICD-10-CM | POA: Diagnosis not present

## 2024-03-02 DIAGNOSIS — F32A Depression, unspecified: Secondary | ICD-10-CM | POA: Diagnosis not present

## 2024-03-02 DIAGNOSIS — I890 Lymphedema, not elsewhere classified: Secondary | ICD-10-CM | POA: Diagnosis not present

## 2024-03-02 DIAGNOSIS — F419 Anxiety disorder, unspecified: Secondary | ICD-10-CM | POA: Diagnosis not present

## 2024-03-02 DIAGNOSIS — E785 Hyperlipidemia, unspecified: Secondary | ICD-10-CM | POA: Diagnosis not present

## 2024-03-02 DIAGNOSIS — Z86718 Personal history of other venous thrombosis and embolism: Secondary | ICD-10-CM | POA: Diagnosis not present

## 2024-03-02 DIAGNOSIS — I2510 Atherosclerotic heart disease of native coronary artery without angina pectoris: Secondary | ICD-10-CM | POA: Diagnosis not present

## 2024-03-02 DIAGNOSIS — S22079D Unspecified fracture of T9-T10 vertebra, subsequent encounter for fracture with routine healing: Secondary | ICD-10-CM | POA: Diagnosis not present

## 2024-03-02 DIAGNOSIS — J449 Chronic obstructive pulmonary disease, unspecified: Secondary | ICD-10-CM | POA: Diagnosis not present

## 2024-03-02 DIAGNOSIS — I951 Orthostatic hypotension: Secondary | ICD-10-CM | POA: Diagnosis not present

## 2024-03-02 DIAGNOSIS — Z86711 Personal history of pulmonary embolism: Secondary | ICD-10-CM | POA: Diagnosis not present

## 2024-03-02 DIAGNOSIS — K219 Gastro-esophageal reflux disease without esophagitis: Secondary | ICD-10-CM | POA: Diagnosis not present

## 2024-03-04 NOTE — Telephone Encounter (Signed)
 PA approved for medication, pt aware.

## 2024-03-09 NOTE — Progress Notes (Deleted)
 Cardiology Clinic Note   Date: 03/09/2024 ID: JEPTHA HINNENKAMP, DOB [birth year], MRN [id number]  Primary Cardiologist:  Yvonne Kendall, MD  Chief Complaint   KRISTIAN MOGG is a 63 y.o. male who presents to the clinic today for ***  Patient Profile   JOHNTHAN AXTMAN is followed by *** for the history outlined below.      Past medical history significant for: CAD. LHC 11/05/2023 (STEMI): Severe multivessel CAD.  He is a 80 to 90% distal LCx.  Occlusion of proximal RCA with bridging and left-to-right collaterals that could not be crossed suggesting chronic nature.  Moderate to severe but noncritical proximal/mid LAD and proximal ramus disease.  IVUS guided PCI with DES 3.0 x 30 mm to distal LCx.  Recommend triple therapy with aspirin, Plavix, Eliquis x 1 month then discontinue aspirin and continue Plavix and Eliquis x 12 months.  Consider functional study when patient has recovered from hospitalization to assess hemodynamic significance of LAD and ramus intermedius disease. Chronic diastolic heart failure. Echo 11/06/2023: EF 55 to 60%.  No RWMA.  Mild LVH.  Grade I DD.  Normal RV function.  Mild LVH.  Mildly elevated PA pressure.  No significant valvular abnormalities. Syncope.  Carotid ultrasound 02/26/2024: Mild calcified plaque of the ICA origins with velocity parameters indicative of <50% stenosis. Hypertension. Hyperlipidemia. Lipid panel 11/05/2023: LDL 119, HDL 59, TG 105, total 199. Chronic venous insufficiency. DVT/PE. S/p IVC filter. Venous ultrasound 02/03/2023: Nonocclusive DVT left popliteal vein.  No DVT right lower extremity. Venous ultrasound 01/18/2024: Areas of thrombus seen now involving the popliteal region but also nonocclusive in the common femoral vein and occlusive and mid femoral vein. 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 Varicose veins. COPD. SLE. CKD stage IIIb. T10 fracture. S/p ORIF of T10 fracture and posterior lateral arthrodesis T7-T12 November  2024.  In summary, patient was first evaluated by Dr. Mariah Milling on 09/20/2021 for palpitations and shortness of breath at the request of Danelle Berry, PA-C.  Echo performed prior to visit demonstrated EF 55 to 60%, no RWMA, Grade II DD, normal RV size/function, no significant valvular abnormalities.  Nuclear stress testing was a low risk study with no evidence of ischemia.  There were coronary artery calcifications in the LAD and LCx.  Patient presented to the ED on 11/05/2023 following a syncopal event at home.  Patient reported being in his usual state of health cooking at home when he suddenly felt lightheaded and fell backwards striking his head.  He believes he passed out for short period of time.  Upon EMS arrival patient was hypotensive and bleeding from his scalp.  EKG showed inferior ST segment elevation prompting activation of STEMI.  Upon arrival to ED bleeding from scalp laceration had stopped.  He denied chest pain but complained of mid back pain and increased shortness of breath from baseline as well as diaphoresis.  He underwent emergent head CT and cervical spine CT prior to being taken to the Cath Lab emergently where he underwent PCI with DES to distal LCx (further details above).  Patient had recurrent lightheadedness with hypotension and bradycardia suspicious for vasovagal episode.  He improved with saline bolus however complained of left lower abdominal pain and was noted to have new bruising along left lower quadrant/left flank.  It was suggested he undergo CT abdomen pelvis which revealed T10 fracture.  Neurosurgery was consulted and felt fracture was highly unstable and could lead to paralysis and recommended urgent surgical stabilization.  He underwent ORIF  of T10 fracture and posterior lateral arthrodesis T7-T12.  Patient was discharged on 11/12/2023. Patient's daughter contacted the office on 11/17/2023 with concerns for patient being alone, not eating and feeling weak.  She requested Dr.  Okey Dupre and nurse check on patient.  It was suggested she contact PCP to involve social services.   Patient presented to the ED on 11/23/2023 with vomiting, back pain, chest tightness, dizziness.  EKG with no acute changes.  Head CT negative for acute process.  CTA chest negative for PE or pneumonia.  Initial labs: Sodium 132, potassium 4.4, creatinine 1.72, BUN 18, WBC 6.3, hemoglobin 13.7, BNP 23.1.  Troponin negative x 2.  He was admitted for vertigo.  MRI brain was negative.  Meclizine did not improve symptoms.  Vestibular physical therapy ruled out peripheral vertigo.  Orthostatic vitals before discharge were positive.  Amlodipine and losartan were discontinued.  Toprol was continued with holding parameters.  Patient was discharged with midodrine 5 mg 3 times daily to keep blood pressure around 140.  Patient was discharged on 11/25/2023.   Patient was seen in follow up on 12/21/2023. He reported continued positional dizziness.He noted occasional, brief central chest tightness.  Midodrine was increased to 10 mg three times a day.   Patient was seen by PCP on 01/18/2024 with complaints of left lower extremity edema.  Venous ultrasound revealed left lower extremity DVT as detailed above.  He was instructed by PCP to stop low-dose Eliquis and begin Eliquis starter pack.  Patient was evaluated by Dr. Okey Dupre on 01/20/2024 with complaints of increased chest pain described as tightness occurring with and without exertion.  Pain was not worsened with deep inspiration but more pronounced when lying on his left side.  He reported dyspnea with modest activity.  Patient had decided not to wear outpatient cardiac monitor.  He had not taking midodrine as home SBP typically 120-130s.  He was hypertensive at the time of his visit and metoprolol was increased.  Patient underwent CTA chest PE protocol for further evaluation of symptoms and was found to not have a PE.   Patient was last seen in the office by Dr. Okey Dupre on 02/10/2024  for follow-up after medication changes and testing.  He reported feeling better with some shortness of breath and episodes of vertigo with no frank lightheadedness or falls.  No further chest pain.  He had been seen by multiple providers since his visit in January and was noted to be hypertensive prompting the addition of losartan and hydralazine.  He reported continued uncontrolled hypertension.  He had not taken any midodrine.  Losartan was increased and he was instructed to continue hydralazine.  Patient presented to the ED on 02/25/2024 with syncopal episode in the shower.  He was found to have orthostatic hypotension in the ER.  Patient was admitted for further workup.  Radiological studies were unremarkable.  Cardiology was consulted.  There was concern for underlying neurologic condition leading to autonomic dysfunction and potential central vertigo.  Patient was encouraged to stay well-hydrated and use compression garments, particularly abdominal binder, for management of dizzy episodes.  Outpatient cardiac monitor was suggested.  No plan for inpatient ischemic workup.  Neurology reviewed all radiologic testing and found no clear causative issues for his symptoms.  It was recommended further outpatient workup with movement disorder specialist to determine underlying parkinsonian features leading to dysautonomia.     History of Present Illness    Today, patient ***  CAD S/p PCI with DES to distal LCx.  CTO proximal RCA with bridging and left to right collaterals, moderate to severe but noncritical proximal/mid LAD and proximal ramus disease.  Patient*** -Continue Plavix, Eliquis, Toprol, rosuvastatin. -Cardiac PET/CT***   Chronic diastolic heart failure Echo November 2024 EF 55-60%, mild LVH, grade I DD, mildly elevated PA pressure. Patient*** Euvolemic and well compensated on exam.  -GDMT limited secondary to orthostatic hypotension.*** -Continue Toprol, losartan, hydralazine.   Orthostatic  hypotension/Dizziness/syncope Recent hospital admission 02/25/2024 for syncopal event getting out of the shower.  Concern for underlying neurologic condition causing autonomic dysfunction and possible central vertigo.  Patient***BP today***. Orthostatic vitals *** -14-day ZIO*** -***  Hyperlipidemia LDL November 2024 119, not at goal. -Continue rosuvastatin.  ROS: All other systems reviewed and are otherwise negative except as noted in History of Present Illness.  EKGs/Labs Reviewed        02/16/2024: ALT 15; AST 15 02/27/2024: BUN 30; Creatinine, Ser 1.57; Potassium 4.0; Sodium 136   02/27/2024: Hemoglobin 11.3; WBC 3.6   10/28/2023: TSH 1.06   11/23/2023: B Natriuretic Peptide 23.1  ***  Risk Assessment/Calculations    {Does this patient have ATRIAL FIBRILLATION?:[id number]} No BP recorded.  {Refresh Note OR Click here to enter BP  :1}***        Physical Exam    VS:  There were no vitals taken for this visit. , BMI There is no height or weight on file to calculate BMI.  GEN: Well nourished, well developed, in no acute distress. Neck: No JVD or carotid bruits. Cardiac: *** RRR. No murmurs. No rubs or gallops.   Respiratory:  Respirations regular and unlabored. Clear to auscultation without rales, wheezing or rhonchi. GI: Soft, nontender, nondistended. Extremities: Radials/DP/PT 2+ and equal bilaterally. No clubbing or cyanosis. No edema ***  Skin: Warm and dry, no rash. Neuro: Strength intact.  Assessment & Plan   ***  Disposition: ***     {Are you ordering a CV Procedure (e.g. stress test, cath, DCCV, TEE, etc)?   Press F2        :[id number]}   Signed, Etta Grandchild. Pasqual Farias, DNP, NP-C

## 2024-03-10 ENCOUNTER — Encounter (INDEPENDENT_AMBULATORY_CARE_PROVIDER_SITE_OTHER): Payer: Self-pay

## 2024-03-10 ENCOUNTER — Ambulatory Visit: Payer: Medicaid Other | Admitting: Student

## 2024-03-10 NOTE — Telephone Encounter (Signed)
 Bring him in for unna boots

## 2024-03-14 ENCOUNTER — Ambulatory Visit (INDEPENDENT_AMBULATORY_CARE_PROVIDER_SITE_OTHER): Admitting: Nurse Practitioner

## 2024-03-14 ENCOUNTER — Encounter (INDEPENDENT_AMBULATORY_CARE_PROVIDER_SITE_OTHER): Payer: Self-pay

## 2024-03-14 VITALS — BP 176/101 | HR 72 | Resp 18 | Ht 74.0 in | Wt 225.0 lb

## 2024-03-14 DIAGNOSIS — I890 Lymphedema, not elsewhere classified: Secondary | ICD-10-CM | POA: Diagnosis not present

## 2024-03-14 DIAGNOSIS — M8000XD Age-related osteoporosis with current pathological fracture, unspecified site, subsequent encounter for fracture with routine healing: Secondary | ICD-10-CM | POA: Diagnosis not present

## 2024-03-14 NOTE — Progress Notes (Signed)
 History of Present Illness  There is no documented history at this time  Assessments & Plan   There are no diagnoses linked to this encounter.    Additional instructions  Subjective:  Patient presents with venous ulcer of the Left lower extremity.    Procedure:  3 layer unna wrap was placed Left lower extremity.   Plan:   Follow up in one week.

## 2024-03-15 ENCOUNTER — Other Ambulatory Visit: Payer: Self-pay

## 2024-03-15 DIAGNOSIS — N401 Enlarged prostate with lower urinary tract symptoms: Secondary | ICD-10-CM

## 2024-03-17 ENCOUNTER — Other Ambulatory Visit: Payer: Self-pay | Admitting: Neurosurgery

## 2024-03-17 DIAGNOSIS — G8929 Other chronic pain: Secondary | ICD-10-CM

## 2024-03-17 DIAGNOSIS — M329 Systemic lupus erythematosus, unspecified: Secondary | ICD-10-CM

## 2024-03-18 ENCOUNTER — Other Ambulatory Visit: Payer: Medicaid Other

## 2024-03-18 DIAGNOSIS — N401 Enlarged prostate with lower urinary tract symptoms: Secondary | ICD-10-CM | POA: Diagnosis not present

## 2024-03-18 DIAGNOSIS — R350 Frequency of micturition: Secondary | ICD-10-CM | POA: Diagnosis not present

## 2024-03-19 ENCOUNTER — Encounter: Payer: Self-pay | Admitting: Family Medicine

## 2024-03-19 LAB — PSA: Prostate Specific Ag, Serum: 0.4 ng/mL (ref 0.0–4.0)

## 2024-03-21 ENCOUNTER — Ambulatory Visit (INDEPENDENT_AMBULATORY_CARE_PROVIDER_SITE_OTHER): Admitting: Nurse Practitioner

## 2024-03-21 ENCOUNTER — Encounter (INDEPENDENT_AMBULATORY_CARE_PROVIDER_SITE_OTHER): Payer: Self-pay

## 2024-03-21 ENCOUNTER — Other Ambulatory Visit: Payer: Self-pay | Admitting: Neurosurgery

## 2024-03-21 VITALS — BP 96/67 | HR 85 | Resp 16

## 2024-03-21 DIAGNOSIS — I890 Lymphedema, not elsewhere classified: Secondary | ICD-10-CM

## 2024-03-21 DIAGNOSIS — G8929 Other chronic pain: Secondary | ICD-10-CM

## 2024-03-21 NOTE — Progress Notes (Signed)
 History of Present Illness  There is no documented history at this time  Assessments & Plan   There are no diagnoses linked to this encounter.    Additional instructions  Subjective:  Patient presents with venous ulcer of the Left lower extremity.    Procedure:  3 layer unna wrap was placed Left lower extremity.   Plan:   Follow up in one week.

## 2024-03-22 ENCOUNTER — Other Ambulatory Visit: Payer: Self-pay

## 2024-03-22 ENCOUNTER — Ambulatory Visit: Payer: BC Managed Care – PPO | Admitting: Urology

## 2024-03-22 DIAGNOSIS — Z111 Encounter for screening for respiratory tuberculosis: Secondary | ICD-10-CM | POA: Diagnosis not present

## 2024-03-22 DIAGNOSIS — Z86718 Personal history of other venous thrombosis and embolism: Secondary | ICD-10-CM | POA: Diagnosis not present

## 2024-03-22 DIAGNOSIS — M329 Systemic lupus erythematosus, unspecified: Secondary | ICD-10-CM | POA: Diagnosis not present

## 2024-03-22 DIAGNOSIS — Z7960 Long term (current) use of unspecified immunomodulators and immunosuppressants: Secondary | ICD-10-CM | POA: Diagnosis not present

## 2024-03-22 DIAGNOSIS — M2550 Pain in unspecified joint: Secondary | ICD-10-CM | POA: Diagnosis not present

## 2024-03-22 DIAGNOSIS — M3214 Glomerular disease in systemic lupus erythematosus: Secondary | ICD-10-CM | POA: Diagnosis not present

## 2024-03-22 MED ORDER — MONTELUKAST SODIUM 10 MG PO TABS: 10.0000 mg | ORAL_TABLET | Freq: Every day | ORAL | 1 refills | Status: AC

## 2024-03-25 ENCOUNTER — Telehealth: Payer: Self-pay

## 2024-03-25 NOTE — Telephone Encounter (Unsigned)
 Copied from CRM [id number]. Topic: Clinical - Home Health Verbal Orders >> Mar 25, 2024  8:34 AM Marlow Baars wrote: Caller/Agency: Junious Dresser with [facility] Callback Number: [phone] Service Requested: Occupational Therapy Frequency: 1 x a week for 8 weeks for recertification Any new concerns about the patient? No  Please assist patient further

## 2024-03-25 NOTE — Telephone Encounter (Signed)
 Copied from CRM [id number]. Topic: Clinical - Home Health Verbal Orders >> Mar 25, 2024 11:50 AM Ward Chatters wrote: Caller/Agency: Phu from [facility] Callback Number: [phone] Service Requested: Physical Therapy Frequency: 1x9w Any new concerns about the patient? No

## 2024-03-28 ENCOUNTER — Ambulatory Visit: Admitting: Nurse Practitioner

## 2024-03-28 ENCOUNTER — Encounter (INDEPENDENT_AMBULATORY_CARE_PROVIDER_SITE_OTHER): Payer: Self-pay | Admitting: Nurse Practitioner

## 2024-03-28 ENCOUNTER — Ambulatory Visit (INDEPENDENT_AMBULATORY_CARE_PROVIDER_SITE_OTHER): Admitting: Nurse Practitioner

## 2024-03-28 ENCOUNTER — Encounter: Payer: Self-pay | Admitting: Nurse Practitioner

## 2024-03-28 VITALS — BP 106/68 | HR 90 | Resp 18 | Ht 74.0 in | Wt 221.0 lb

## 2024-03-28 DIAGNOSIS — H66002 Acute suppurative otitis media without spontaneous rupture of ear drum, left ear: Secondary | ICD-10-CM

## 2024-03-28 DIAGNOSIS — I890 Lymphedema, not elsewhere classified: Secondary | ICD-10-CM

## 2024-03-28 MED ORDER — AMOXICILLIN 875 MG PO TABS: 875.0000 mg | ORAL_TABLET | Freq: Two times a day (BID) | ORAL | 0 refills | Status: AC

## 2024-03-28 NOTE — Progress Notes (Signed)
 Patient came in today for weekly left unna wraps. Patient will come out of wraps today due to no swelling or open wounds. Patient will keep upcoming appointment as scheduled. Patient was advise to wear compression daily and removed at night.

## 2024-03-28 NOTE — Progress Notes (Signed)
 BP 106/68   Pulse 90   Resp 18   Ht 6\' 2"  (1.88 m)   Wt 221 lb (100.2 kg)   SpO2 98%   BMI 28.37 kg/m    Subjective:    Patient ID: Jonathon Snow, male    DOB: 10-31-1961, 63 y.o.   MRN: [id number]  HPI: Jonathon Snow is a 64 y.o. male Patient presents to clinic with complaint of left ear pain/pressure 3/10 x 1 week. Patient denies occurrence of recent illness and denies associated symptoms of cough, nasal congestion, headache, fever. Patient reports that he is not currently taking any medications for his symptom and states that his left ear pain worsens when laying on left side.        01/07/2024   11:08 AM 10/28/2023    1:32 PM 07/07/2023   10:00 AM  Depression screen PHQ 2/9  Decreased Interest 1 0 3  Down, Depressed, Hopeless 1 0 3  PHQ - 2 Score 2 0 6  Altered sleeping 1  2  Tired, decreased energy 3  2  Change in appetite 0  2  Feeling bad or failure about yourself  0  0  Trouble concentrating 0  2  Moving slowly or fidgety/restless 0  0  Suicidal thoughts 0  0  PHQ-9 Score 6  14  Difficult doing work/chores   Very difficult    Relevant past medical, surgical, family and social history reviewed and updated as indicated. Interim medical history since our last visit reviewed. Allergies and medications reviewed and updated.  Review of Systems  Ten systems reviewed and is negative except as mentioned in HPI  Per HPI unless specifically indicated above     Objective:    BP 106/68   Pulse 90   Resp 18   Ht 6\' 2"  (1.88 m)   Wt 221 lb (100.2 kg)   SpO2 98%   BMI 28.37 kg/m    Wt Readings from Last 3 Encounters:  03/28/24 221 lb (100.2 kg)  03/14/24 225 lb (102.1 kg)  03/01/24 225 lb (102.1 kg)    Physical Exam Vitals reviewed.  Constitutional:      Appearance: Normal appearance.  HENT:     Head: Normocephalic.     Right Ear: Tympanic membrane normal.     Left Ear: A middle ear effusion is present. There is mastoid tenderness.  Cardiovascular:      Rate and Rhythm: Normal rate and regular rhythm.  Pulmonary:     Effort: Pulmonary effort is normal.     Breath sounds: Normal breath sounds.  Musculoskeletal:        General: Normal range of motion.  Skin:    General: Skin is warm and dry.  Neurological:     General: No focal deficit present.     Mental Status: He is alert and oriented to person, place, and time. Mental status is at baseline.  Psychiatric:        Mood and Affect: Mood normal.        Behavior: Behavior normal.        Thought Content: Thought content normal.        Judgment: Judgment normal.     Results for orders placed or performed in visit on 03/18/24  PSA   Collection Time: 03/18/24  8:07 AM  Result Value Ref Range   Prostate Specific Ag, Serum 0.4 0.0 - 4.0 ng/mL   *Note: Due to a large number of results and/or encounters for  the requested time period, some results have not been displayed. A complete set of results can be found in Results Review.       Assessment & Plan:   Problem List Items Addressed This Visit   None Visit Diagnoses       Non-recurrent acute suppurative otitis media of left ear without spontaneous rupture of tympanic membrane    -  Primary   start amoxicillin BID, follow up if no improvement   Relevant Medications   amoxicillin (AMOXIL) 875 MG tablet             Follow up plan: Return if symptoms worsen or fail to improve.

## 2024-03-29 ENCOUNTER — Ambulatory Visit: Payer: Self-pay | Admitting: Urology

## 2024-03-29 ENCOUNTER — Other Ambulatory Visit: Payer: Medicaid Other

## 2024-03-29 ENCOUNTER — Ambulatory Visit: Payer: Medicaid Other | Admitting: Internal Medicine

## 2024-03-29 VITALS — BP 141/93 | HR 85 | Ht 74.0 in | Wt 222.0 lb

## 2024-03-29 DIAGNOSIS — N401 Enlarged prostate with lower urinary tract symptoms: Secondary | ICD-10-CM

## 2024-03-29 DIAGNOSIS — R350 Frequency of micturition: Secondary | ICD-10-CM | POA: Diagnosis not present

## 2024-03-29 LAB — BLADDER SCAN AMB NON-IMAGING: Scan Result: 0

## 2024-03-29 MED ORDER — TAMSULOSIN HCL 0.4 MG PO CAPS: 0.4000 mg | ORAL_CAPSULE | Freq: Every day | ORAL | 11 refills | Status: AC

## 2024-03-29 MED ORDER — OXYBUTYNIN CHLORIDE ER 15 MG PO TB24: 15.0000 mg | ORAL_TABLET | Freq: Every day | ORAL | 11 refills | Status: DC

## 2024-03-29 NOTE — Progress Notes (Signed)
 I,Amy L Pierron,acting as a scribe for Vanna Scotland, MD.,have documented all relevant documentation on the behalf of Vanna Scotland, MD,as directed by  Vanna Scotland, MD while in the presence of Vanna Scotland, MD.  03/29/2024 11:24 AM   [name] [birth year] [id number]  Referring provider: Danelle Berry, PA-C [address] [place],  [name] [address]  Chief Complaint  Patient presents with   Follow-up   Erectile Dysfunction   Benign Prostatic Hypertrophy    HPI: 63 year-old male presents for further evaluation of worsening urinary symptoms.   He has a personal history of symptomatic benign prostatic hyperplasia (BPH) and was recently seen with exacerbated symptoms after discontinuing his urologic medications. He was reinitiated on Vesicare and Rapaflo by [employer].   He previously underwent an anatomic evaluation revealing minimal trabeculation and a relatively small prostate at 33 grams. He was offered a Urolift procedure but declined.   His most recent PSA was 0.4 on 03/18/2024.   He expresses dissatisfaction with his current symptoms, which include urgency and frequency. He reports minimal improvement in symptoms after six weeks of treatment. He was previously on Flomax and Oxybutynin, which he felt were more effective.   He has concerns about undergoing procedures due to a history of multiple unresolved health issues.   Results for orders placed or performed in visit on 03/29/24  BLADDER SCAN AMB NON-IMAGING  Result Value Ref Range   Scan Result 0 ml     IPSS     Row Name 03/29/24 0800         International Prostate Symptom Score   How often have you had the sensation of not emptying your bladder? About half the time     How often have you had to urinate less than every two hours? About half the time     How often have you found you stopped and started again several times when you urinated? More than half the time     How often have you  found it difficult to postpone urination? Less than half the time     How often have you had a weak urinary stream? More than half the time     How often have you had to strain to start urination? More than half the time     How many times did you typically get up at night to urinate? 2 Times     Total IPSS Score 22       Quality of Life due to urinary symptoms   If you were to spend the rest of your life with your urinary condition just the way it is now how would you feel about that? 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   Overview:  superior sagittal sinus, left transverse sinus and cortical veins    Closed left hip fracture, initial encounter (HCC) 02/18/2023   COVID-19 virus infection 12/2020   Depression    DVT (deep venous thrombosis) (HCC)    Dyspnea    GERD (gastroesophageal reflux disease)    Head injury 11/05/2023   Heart attack (HCC) 11/05/2023   Heel spur, left 02/16/2019  Heel spur, right 02/16/2019   Hematoma of groin 11/08/2023   Herpes zoster infection of lumbar region 02/20/2020   History of DVT (deep vein thrombosis) 03/22/2020   History of kidney stones    Hyperlipidemia    Hypertension    Long term current use of systemic steroids 11/08/2020   Lupus    Lymphedema 10/07/2018   Morbid obesity (HCC)    Opiate abuse, episodic (HCC) 02/26/2018   Osteoporosis    Pain and swelling of right lower extremity 02/03/2023   Pneumonia    PONV (postoperative nausea and vomiting)    Postphlebitic syndrome with ulcer, 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   Presence of IVC filter 03/22/2020   Removed   Pulmonary embolism (HCC)    Renal disorder    Stage III   Severe  episode of recurrent major depressive disorder, 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   Shock circulatory (HCC) 11/06/2023   STEMI (ST elevation myocardial infarction) (HCC) 11/05/2023    Surgical History: Past Surgical History:  Procedure Laterality Date   ANKLE SURGERY Right    BACK SURGERY     BRONCHIAL WASHINGS N/A 11/01/2021   Procedure: BRONCHIAL WASHINGS;  Surgeon: Vida Rigger, MD;  Location: [facility] ORS;  Service: Thoracic;  Laterality: N/A;   COLONOSCOPY WITH PROPOFOL N/A 05/28/2020   Procedure: COLONOSCOPY WITH PROPOFOL;  Surgeon: Wyline Mood, MD;  Location: [facility] ENDOSCOPY;  Service: Endoscopy;  Laterality: N/A;   COLONOSCOPY WITH PROPOFOL N/A 07/01/2023   Procedure: COLONOSCOPY WITH PROPOFOL;  Surgeon: Wyline Mood, MD;  Location: [facility] ENDOSCOPY;  Service: Gastroenterology;  Laterality: N/A;   CORONARY ULTRASOUND/IVUS N/A 11/05/2023   Procedure: Coronary Ultrasound/IVUS;  Surgeon: Yvonne Kendall, MD;  Location: [facility] INVASIVE CV LAB;  Service: Cardiovascular;  Laterality: N/A;   CORONARY/GRAFT ACUTE MI REVASCULARIZATION N/A 11/05/2023   Procedure: Coronary/Graft Acute MI Revascularization;  Surgeon: Yvonne Kendall, MD;  Location: [facility] INVASIVE CV LAB;  Service: Cardiovascular;  Laterality: N/A;   CYST EXCISION  92 or 93    Liver cyst removal [facility]   FLEXIBLE BRONCHOSCOPY N/A 11/01/2021   Procedure: FLEXIBLE BRONCHOSCOPY;  Surgeon: Vida Rigger, MD;  Location: [facility] ORS;  Service: Thoracic;  Laterality: N/A;   HIP PINNING,CANNULATED Left 02/19/2023   Procedure: PERCUTANEOUS FIXATION OF FEMORAL NECK;  Surgeon: Signa Kell, MD;  Location: [facility] ORS;  Service: Orthopedics;  Laterality: Left;   I & D EXTREMITY Right 04/29/2017   Procedure: IRRIGATION AND DEBRIDEMENT EXTREMITY;  Surgeon: Ricarda Frame, MD;  Location: [facility] ORS;  Service: General;  Laterality: Right;   IRRIGATION AND DEBRIDEMENT ABSCESS Left 04/29/2017   Procedure: IRRIGATION AND DEBRIDEMENT Scrotal ABSCESS;   Surgeon: Ricarda Frame, MD;  Location: [facility] ORS;  Service: General;  Laterality: Left;   LEFT HEART CATH AND CORONARY ANGIOGRAPHY N/A 11/05/2023   Procedure: LEFT HEART CATH AND CORONARY ANGIOGRAPHY;  Surgeon: Yvonne Kendall, MD;  Location: [facility] INVASIVE CV LAB;  Service: Cardiovascular;  Laterality: N/A;   POLYPECTOMY  07/01/2023   Procedure: POLYPECTOMY;  Surgeon: Wyline Mood, MD;  Location: [facility] ENDOSCOPY;  Service: Gastroenterology;;    Home Medications:  Allergies as of 03/29/2024       Reactions   Enalapril Other (See Comments)   Unknown reaction   Vicodin [hydrocodone-acetaminophen] Hives, Rash   Severe headaches (also) NAME BRAND ONLY PER PT CAN TAKE GENERIC        Medication List        Accurate as of March 29, 2024 11:24 AM. If you have any questions, ask your nurse or doctor.  STOP taking these medications    silodosin 8 MG Caps capsule Commonly known as: RAPAFLO   solifenacin 5 MG tablet Commonly known as: VESICARE       TAKE these medications    amoxicillin 875 MG tablet Commonly known as: AMOXIL Take 1 tablet (875 mg total) by mouth 2 (two) times daily for 7 days.   apixaban 5 MG Tabs tablet Commonly known as: ELIQUIS Take 1 tablet (5 mg total) by mouth 2 (two) times daily.   cholecalciferol 25 MCG (1000 UNIT) tablet Commonly known as: VITAMIN D3 Take 1,000 Units by mouth daily.   clonazePAM 0.5 MG tablet Commonly known as: KLONOPIN Take 0.5 mg by mouth daily.   clopidogrel 75 MG tablet Commonly known as: PLAVIX TAKE 1 TABLET(75 MG) BY MOUTH DAILY   gabapentin 100 MG capsule Commonly known as: NEURONTIN Take 2 capsules (200 mg total) by mouth 3 (three) times daily.   hydrALAZINE 25 MG tablet Commonly known as: APRESOLINE Take 1 tablet (25 mg total) by mouth 3 (three) times daily.   hydrOXYzine 10 MG tablet Commonly known as: ATARAX Take 1 tablet (10 mg total) by mouth every 8 (eight) hours as needed for itching.    levocetirizine 5 MG tablet Commonly known as: XYZAL Take 1 tablet (5 mg total) by mouth every evening.   losartan 50 MG tablet Commonly known as: COZAAR Take 1 tablet (50 mg total) by mouth daily.   meclizine 12.5 MG tablet Commonly known as: ANTIVERT Take 1 tablet (12.5 mg total) by mouth 3 (three) times daily as needed for dizziness.   metoprolol succinate 25 MG 24 hr tablet Commonly known as: TOPROL-XL Take 1 tablet (25 mg total) by mouth daily.   montelukast 10 MG tablet Commonly known as: SINGULAIR Take 1 tablet (10 mg total) by mouth at bedtime.   multivitamin with minerals tablet Take 1 tablet by mouth daily.   mycophenolate 500 MG tablet Commonly known as: CELLCEPT Take 2 tablets (1,000 mg total) by mouth 2 (two) times daily. HOLD until neurosurgery is ok to restart this (waiting for incision to heal) What changed: how much to take   oxybutynin 15 MG 24 hr tablet Commonly known as: DITROPAN XL Take 1 tablet (15 mg total) by mouth daily.   rosuvastatin 20 MG tablet Commonly known as: CRESTOR Take 1 tablet (20 mg total) by mouth daily.   sertraline 100 MG tablet Commonly known as: ZOLOFT Take 1.5 tablets (150 mg total) by mouth daily.   tadalafil 20 MG tablet Commonly known as: CIALIS Take 1 tablet (20 mg total) by mouth daily as needed for erectile dysfunction. Avoid taking it if low blood pressure   tamsulosin 0.4 MG Caps capsule Commonly known as: FLOMAX Take 1 capsule (0.4 mg total) by mouth daily.        Allergies:  Allergies  Allergen Reactions   Enalapril Other (See Comments)    Unknown reaction   Vicodin [Hydrocodone-Acetaminophen] Hives and Rash    Severe headaches (also) NAME BRAND ONLY PER PT CAN TAKE GENERIC    Family History: Family History  Problem Relation Age of Onset   Hypertension Father    Heart disease Father    Clotting disorder Mother    Kidney disease Brother    Heart attack Maternal Grandmother    Heart attack  Maternal Grandfather    Heart attack Paternal Grandfather     Social History:  reports that he has never smoked. He has never used smokeless tobacco. He  reports that he does not drink alcohol and does not use drugs.   Physical Exam: BP (!) 141/93   Pulse 85   Ht 6\' 2"  (1.88 m)   Wt 222 lb (100.7 kg)   BMI 28.50 kg/m   Constitutional:  Alert and oriented, No acute distress. HEENT: [place] AT, moist mucus membranes.  Trachea midline, no masses. Neurologic: Grossly intact, no focal deficits, moving all 4 extremities. Psychiatric: Normal mood and affect.   Assessment & Plan:    1. BPH with urinary symptoms - Current medications, Vesicare and Rapaflo, have not provided the desired improvement after six weeks. Plan to discontinue Vesicare and Rapaflo and restart Flomax and Oxybutynin, increasing the Oxybutynin dose to 15 mg XL to address urgency and frequency.  - Advised him to contact the provider in one month to report on symptom changes.  - He has previously declined the Urolift procedure due to concerns about its efficacy and the potential need for repeat interventions. The option remains available if medication adjustments do not yield satisfactory results.  Return in about 1 year (around 03/29/2025) for IPSS, PSA, PVR, DRE.  I have reviewed the above documentation for accuracy and completeness, and I agree with the above.   Vanna Scotland, MD   [facility] [address] [place], [name] [address] [phone]

## 2024-03-30 ENCOUNTER — Other Ambulatory Visit (INDEPENDENT_AMBULATORY_CARE_PROVIDER_SITE_OTHER): Payer: Self-pay | Admitting: Nurse Practitioner

## 2024-03-30 DIAGNOSIS — I82402 Acute embolism and thrombosis of unspecified deep veins of left lower extremity: Secondary | ICD-10-CM

## 2024-03-31 NOTE — Telephone Encounter (Signed)
 Verbal orders given

## 2024-04-01 ENCOUNTER — Encounter (INDEPENDENT_AMBULATORY_CARE_PROVIDER_SITE_OTHER): Payer: Medicaid Other

## 2024-04-01 ENCOUNTER — Ambulatory Visit (INDEPENDENT_AMBULATORY_CARE_PROVIDER_SITE_OTHER): Payer: Medicaid Other | Admitting: Vascular Surgery

## 2024-04-01 NOTE — Progress Notes (Signed)
 Cardiology Clinic Note   Date: 04/04/2024 ID: [name], DOB [birth year], MRN [id number]  Primary Cardiologist:  Jonathon Crisp, MD  Chief Complaint   Jonathon Snow is a 63 y.o. male who presents to the clinic today for hospital follow up.   Patient Profile   Jonathon Snow is followed by Jonathon Snow for the history outlined below.      Past medical history significant for: CAD. LHC 11/05/2023 (STEMI): Severe multivessel CAD.  He is a 80 to 90% distal LCx.  Occlusion of proximal RCA with bridging and left-to-right collaterals that could not be crossed suggesting chronic nature.  Moderate to severe but noncritical proximal/mid LAD and proximal ramus disease.  IVUS guided PCI with DES 3.0 x 30 mm to distal LCx.  Recommend triple therapy with aspirin, Plavix, Eliquis x 1 month then discontinue aspirin and continue Plavix and Eliquis x 12 months.  Consider functional study when patient has recovered from hospitalization to assess hemodynamic significance of LAD and ramus intermedius disease. Chronic diastolic heart failure. Echo 11/06/2023: EF 55 to 60%.  No RWMA.  Mild LVH.  Grade I DD.  Normal RV function.  Mild LVH.  Mildly elevated PA pressure.  No significant valvular abnormalities. Syncope.  Carotid ultrasound 02/26/2024: Mild calcified plaque of the ICA origins with velocity parameters indicative of <50% stenosis. Hypertension. Hyperlipidemia. Lipid panel 11/05/2023: LDL 119, HDL 59, TG 105, total 199. Chronic venous insufficiency. DVT/PE. S/p IVC filter. Venous ultrasound 02/03/2023: Nonocclusive DVT left popliteal vein.  No DVT right lower extremity. Venous ultrasound 01/18/2024: Areas of thrombus seen now involving the popliteal region but also nonocclusive in the common femoral vein and occlusive and mid femoral vein. 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 Varicose veins. COPD. SLE. CKD stage IIIb. T10 fracture. S/p ORIF of T10 fracture and posterior lateral  arthrodesis T7-T12 November 2024.  In summary, patient was first evaluated by Dr. Gollan on 09/20/2021 for palpitations and shortness of breath at the request of Jonathon Hone, PA-C.  Echo performed prior to visit demonstrated EF 55 to 60%, no RWMA, Grade II DD, normal RV size/function, no significant valvular abnormalities.  Nuclear stress testing was a low risk study with no evidence of ischemia.  There were coronary artery calcifications in the LAD and LCx.  Patient presented to the ED on 11/05/2023 following a syncopal event at home.  Patient reported being in his usual state of health cooking at home when he suddenly felt lightheaded and fell backwards striking his head.  He believes he passed out for short period of time.  Upon EMS arrival patient was hypotensive and bleeding from his scalp.  EKG showed inferior ST segment elevation prompting activation of STEMI.  Upon arrival to ED bleeding from scalp laceration had stopped.  He denied chest pain but complained of mid back pain and increased shortness of breath from baseline as well as diaphoresis.  He underwent emergent head CT and cervical spine CT prior to being taken to the Cath Lab emergently where he underwent PCI with DES to distal LCx (further details above).  Patient had recurrent lightheadedness with hypotension and bradycardia suspicious for vasovagal episode.  He improved with saline bolus however complained of left lower abdominal pain and was noted to have new bruising along left lower quadrant/left flank.  It was suggested he undergo CT abdomen pelvis which revealed T10 fracture.  Neurosurgery was consulted and felt fracture was highly unstable and could lead to paralysis and recommended urgent surgical stabilization.  He underwent ORIF of T10 fracture and posterior lateral arthrodesis T7-T12.  Patient was discharged on 11/12/2023. Patient's daughter contacted the office on 11/17/2023 with concerns for patient being alone, not eating and  feeling weak.  She requested Jonathon Snow and nurse check on patient.  It was suggested she contact PCP to involve social services.   Patient presented to the ED on 11/23/2023 with vomiting, back pain, chest tightness, dizziness.  EKG with no acute changes.  Head CT negative for acute process.  CTA chest negative for PE or pneumonia.  Initial labs: Sodium 132, potassium 4.4, creatinine 1.72, BUN 18, WBC 6.3, hemoglobin 13.7, BNP 23.1.  Troponin negative x 2.  He was admitted for vertigo.  MRI brain was negative.  Meclizine did not improve symptoms.  Vestibular physical therapy ruled out peripheral vertigo.  Orthostatic vitals before discharge were positive.  Amlodipine and losartan were discontinued.  Toprol was continued with holding parameters.  Patient was discharged with midodrine 5 mg 3 times daily to keep blood pressure around 140.  Patient was discharged on 11/25/2023.   Patient was seen in follow up on 12/21/2023. He reported continued positional dizziness.He noted occasional, brief central chest tightness.  Midodrine was increased to 10 mg three times a day.   Patient was seen by PCP on 01/18/2024 with complaints of left lower extremity edema.  Venous ultrasound revealed left lower extremity DVT as detailed above.  He was instructed by PCP to stop low-dose Eliquis and begin Eliquis starter pack.  Patient was evaluated by Jonathon Snow on 01/20/2024 with complaints of increased chest pain described as tightness occurring with and without exertion.  Pain was not worsened with deep inspiration but more pronounced when lying on his left side.  He reported dyspnea with modest activity.  Patient had decided not to wear outpatient cardiac monitor.  He had not taking midodrine as home SBP typically 120-130s.  He was hypertensive at the time of his visit and metoprolol was increased.  Patient underwent CTA chest PE protocol for further evaluation of symptoms and was found to not have a PE.   Patient was last seen in the  office by Jonathon Snow on 02/10/2024 for follow-up after medication changes and testing.  He reported feeling better with some shortness of breath and episodes of vertigo with no frank lightheadedness or falls.  No further chest pain.  He had been seen by multiple providers since his visit in January and was noted to be hypertensive prompting the addition of losartan and hydralazine.  He reported continued uncontrolled hypertension.  He had not taken any midodrine.  Losartan was increased and he was instructed to continue hydralazine.  Patient presented to the ED on 02/25/2024 with syncopal episode in the shower.  He was found to have orthostatic hypotension in the ER.  Patient was admitted for further workup.  Radiological studies were unremarkable.  Cardiology was consulted.  There was concern for underlying neurologic condition leading to autonomic dysfunction and potential central vertigo.  Patient was encouraged to stay well-hydrated and use compression garments, particularly abdominal binder, for management of dizzy episodes.  Outpatient cardiac monitor was suggested.  No plan for inpatient ischemic workup.  Neurology reviewed all radiologic testing and found no clear causative issues for his symptoms.  It was recommended further outpatient workup with movement disorder specialist to determine underlying parkinsonian features leading to dysautonomia.     History of Present Illness    Today, patient reports continued episodes of presyncope occurring 2-3  times a week. He immediately sits down or lays down when he feels an episode starting. He reports wearing compression sleeves that go up to his thighs. He has not tried abdominal binding. He has a visit coming up with neurology. He is unsure if this visit is with a movement disorder specialist. He states he does not recall being told that he needs a referral to one. He reports chest tightness with rest more frequently than with activity. He feels this is related  to his reflux, as he was taken off omeprazole. He started back on it recently. He has not worn the outpatient monitor. He did not get it for several days after getting out of the hospital, as his neighbor had picked it up from his porch not knowing he was home from the hospital and did not bring it to him for several days. By the time he got it he wasn't sure he needed to do it. He inquires about starting cardiac rehab. He is doing home health PT.     ROS: All other systems reviewed and are otherwise negative except as noted in History of Present Illness.  EKGs/Labs Reviewed    EKG Interpretation Date/Time:  Monday April 04 2024 13:33:41 EDT Ventricular Rate:  75 PR Interval:  170 QRS Duration:  94 QT Interval:  378 QTC Calculation: 422 R Axis:   53  Text Interpretation: Normal sinus rhythm Normal ECG When compared with ECG of 25-Feb-2024 15:59, Criteria for Anterior infarct are no longer Present Criteria for Inferior infarct are no longer Present T wave inversion no longer evident in Inferior leads Confirmed by Jonathon Snow [phone]) on 04/04/2024 1:44:07 PM   02/16/2024: ALT 15; AST 15 02/27/2024: BUN 30; Creatinine, Ser 1.57; Potassium 4.0; Sodium 136   02/27/2024: Hemoglobin 11.3; WBC 3.6   10/28/2023: TSH 1.06   11/23/2023: B Natriuretic Peptide 23.1    Physical Exam    VS:  BP 132/80   Pulse 78   Ht 6\' 2"  (1.88 m)   Wt 223 lb 6.4 oz (101.3 kg)   SpO2 99%   BMI 28.68 kg/m  , BMI Body mass index is 28.68 kg/m.  GEN: Well nourished, well developed, in no acute distress. Neck: No JVD or carotid bruits. Cardiac:  RRR. No murmurs. No rubs or gallops.   Respiratory:  Respirations regular and unlabored. Clear to auscultation without rales, wheezing or rhonchi. GI: Soft, nontender, nondistended. Extremities: Radials/DP/PT 2+ and equal bilaterally. No clubbing or cyanosis. Mild, nonpitting lower extremity edema bilaterally with chronic color changes.   Skin: Warm and dry, no  rash. Neuro: Strength intact.  Assessment & Plan   CAD S/p PCI with DES to distal LCx.  CTO proximal RCA with bridging and left to right collaterals, moderate to severe but noncritical proximal/mid LAD and proximal ramus disease.  Patient reports chest tightness with rest more frequently than with activity. He thinks this may be heart burn related, as his omeprazole was stopped. He recently started back on it. Discussed stopping omeprazole secondary to interacting with Plavix. Will start him on pantoprazole. He would like to start cardiac rehab but given his continued presyncopal episodes and pending visit with neurology, would like to hold off for a bit longer.  -Continue Plavix, Eliquis, Toprol, rosuvastatin. -Add Protonix 20 mg daily.    Chronic diastolic heart failure Echo November 2024 EF 55-60%, mild LVH, grade I DD, mildly elevated PA pressure. Patient reports wearing compression sleeves to thighs. He has some mild nonpitting edema  with chronic color changes bilateral lower extremities. Euvolemic and well compensated on exam.  -GDMT limited secondary to orthostatic hypotension. -Continue Toprol, losartan, hydralazine.   Orthostasis/Dizziness/near syncope/syncope Recent hospital admission 02/25/2024 for syncopal event getting out of the shower.  Concern for underlying neurologic condition causing autonomic dysfunction and possible central vertigo.  Patient has a visit coming up with neurology. He is unsure if it is with a movement specialist. He has not worn the [employer] monitor, as he was not sure if he needed to.  BP today 132/80. He reports wearing compression socks and compression sleeves to his thighs. He has not tried abdominal compression.  -Add abdominal compression. -He will place Zio monitor today.  -Will move EP visit to May so monitor will be resulted at that time.   Hyperlipidemia LDL November 2024 119, not at goal. -Continue rosuvastatin.  Disposition: Pantoprazole 20 mg daily.  Return in 5-6 months or sooner as needed.          Signed, Jonathon Snow. Jonathon Gelpi, DNP, NP-C

## 2024-04-04 ENCOUNTER — Encounter: Payer: Self-pay | Admitting: Family Medicine

## 2024-04-04 ENCOUNTER — Encounter: Payer: Self-pay | Admitting: Student

## 2024-04-04 ENCOUNTER — Ambulatory Visit: Admitting: Family Medicine

## 2024-04-04 ENCOUNTER — Ambulatory Visit: Attending: Student | Admitting: Student

## 2024-04-04 VITALS — BP 130/74 | HR 91 | Resp 16 | Ht 74.0 in | Wt 223.0 lb

## 2024-04-04 VITALS — BP 132/80 | HR 78 | Ht 74.0 in | Wt 223.4 lb

## 2024-04-04 DIAGNOSIS — H9202 Otalgia, left ear: Secondary | ICD-10-CM

## 2024-04-04 DIAGNOSIS — J302 Other seasonal allergic rhinitis: Secondary | ICD-10-CM | POA: Diagnosis not present

## 2024-04-04 DIAGNOSIS — R55 Syncope and collapse: Secondary | ICD-10-CM | POA: Diagnosis not present

## 2024-04-04 DIAGNOSIS — I2510 Atherosclerotic heart disease of native coronary artery without angina pectoris: Secondary | ICD-10-CM

## 2024-04-04 DIAGNOSIS — I951 Orthostatic hypotension: Secondary | ICD-10-CM

## 2024-04-04 DIAGNOSIS — H6992 Unspecified Eustachian tube disorder, left ear: Secondary | ICD-10-CM

## 2024-04-04 DIAGNOSIS — R42 Dizziness and giddiness: Secondary | ICD-10-CM

## 2024-04-04 DIAGNOSIS — J329 Chronic sinusitis, unspecified: Secondary | ICD-10-CM

## 2024-04-04 DIAGNOSIS — I5032 Chronic diastolic (congestive) heart failure: Secondary | ICD-10-CM | POA: Diagnosis not present

## 2024-04-04 MED ORDER — FLUTICASONE PROPIONATE 50 MCG/ACT NA SUSP: 2.0000 | Freq: Every day | NASAL | 6 refills | Status: DC

## 2024-04-04 MED ORDER — PANTOPRAZOLE SODIUM 20 MG PO TBEC: 20.0000 mg | DELAYED_RELEASE_TABLET | Freq: Every day | ORAL | 3 refills | Status: DC

## 2024-04-04 NOTE — Patient Instructions (Addendum)
 Continue you're current allergy meds - Xyzal and singulair  And try adding a steroid nose spray and decongestant (if BP and HR tolerates) I sent in flonase to the pharmacy, and get claritin-D from the pharmacist (behind the counter - 24 hour)   04/12/2024 10:30 AM EDT Initial consult [facility]  [address]  [place], [name] [address]  [phone]  Herold Lora, MD  [phone] [facility] [address]  [facility]  [place], [name] [address]  [phone] (Work)  [phone] (Fax)  NPPW mailed 04.11.25  05/24/2024 8:15 AM EDT Office Visit [facility]  [address]  [place], [name] [address]  [phone]  Melissa Spring, MD  [address]  [place], [name] [address]  [phone] (Work)  [phone] (Fax)  2 mo  09/15/2024 8:30 AM EDT Office Visit [facility]  [address]  [place], [name] [address]  [phone]  [facility] [place], [place]  [address]  [place], [name] [address]  [phone] (Work)  [phone] (Fax)  Return in about 6 months (around 09/14/2024).

## 2024-04-04 NOTE — Patient Instructions (Addendum)
 Medication Instructions:  Your physician recommends the following medication changes.   START TAKING:  Pantoprazole 20 mg daily   *If you need a refill on your cardiac medications before your next appointment, please call your pharmacy*  Lab Work: No labs ordered today  If you have labs (blood work) drawn today and your tests are completely normal, you will receive your results only by: MyChart Message (if you have MyChart) OR A paper copy in the mail If you have any lab test that is abnormal or we need to change your treatment, we will call you to review the results.  Testing/Procedures: No test ordered today   Follow-Up: At [facility], you and your health needs are our priority.  As part of our continuing mission to provide you with exceptional heart care, our providers are all part of one team.  This team includes your primary Cardiologist (physician) and Advanced Practice Providers or APPs (Physician Assistants and Nurse Practitioners) who all work together to provide you with the care you need, when you need it.  Your next appointment:   5-6  month(s) follow up Alverta Johns , NP    2 week of May 2025 follow up with any of the EP doctors  Provider:   Richardo Chandler, MD or Harvie Liner, MD , Ardeen Kohler MD

## 2024-04-04 NOTE — Progress Notes (Signed)
 Patient ID: Jonathon Snow, male    DOB: May 22, 1961, 63 y.o.   MRN: [id number]  PCP: Danelle Berry, PA-C  Chief Complaint  Patient presents with   Ear Pain    Left, still not better since seeing Raynelle Fanning    Subjective:   Jonathon Snow is a 63 y.o. 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) 01/20/2024   Anxiety 12/14/2023   Overweight (BMI 25.0-29.9) 11/23/2023   Chronic heart failure with preserved ejection fraction (HFpEF) (HCC) 11/23/2023   BPH (benign prostatic hyperplasia) 11/23/2023   Unstable angina (HCC) 11/10/2023   Closed T10 spinal fracture (HCC) 11/06/2023   Closed tricolumnar fracture of thoracic vertebra (HCC) 11/06/2023   Thoracic spine instability 11/06/2023   Chronic bilateral low back pain with left-sided sciatica 07/07/2023   Neuropathy 07/07/2023   Hx of colonic polyps 07/01/2023   Adenomatous polyp of colon 07/01/2023   Erectile disorder 01/22/2023   Coronary artery disease involving native coronary artery of native heart without angina pectoris 09/09/2022   Varicose 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 01/03/2022   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) 09/18/2021   Prediabetes 11/08/2020   Stage 3a chronic kidney disease (HCC) 11/08/2020   Seasonal allergies 11/08/2020   Rhinosinusitis 11/08/2020   Anticoagulant disorder (HCC) 11/07/2020   Senile purpura  (HCC) 11/07/2020   MDD (major depressive disorder), recurrent episode, mild (HCC) 09/12/2019   Other spondylosis with radiculopathy, lumbar region 02/16/2019   Osteoporosis 10/12/2018   Chronic venous insufficiency 10/07/2018   Lymphedema 10/07/2018   SLE glomerulonephritis syndrome, 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HCC) 03/03/2018   Syncope and collapse 12/29/2017   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) 10/29/2016   Hyperlipidemia LDL goal <70 01/15/2016   Uncontrolled hypertension 01/15/2016   Obesity (BMI 30.0-34.9) 01/15/2016   Long term current use of anticoagulant 10/04/2015   Systemic lupus erythematosus (HCC) 05/30/2015   COPD, moderate (HCC) 06/27/2014   Shortness of breath 06/27/2014   History of pulmonary embolism 12/11/2013   Nodule of right lung 12/11/2013   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   Cystic disease of liver 08/21/2013      Current Outpatient Medications:    amoxicillin (AMOXIL) 875 MG tablet, Take 1 tablet (875 mg total) by mouth 2 (two) times daily for 7 days., Disp: 14 tablet, Rfl: 0   apixaban (ELIQUIS) 5 MG TABS tablet, Take 1 tablet (5 mg total) by mouth 2 (two) times daily., Disp: 180 tablet, Rfl: 1   cholecalciferol (VITAMIN D3) 25 MCG (1000 UNIT) tablet, Take 1,000 Units by mouth daily., Disp: , Rfl:    clonazePAM (KLONOPIN) 0.5 MG tablet, Take 0.5 mg by mouth daily., Disp: , Rfl:    clopidogrel (PLAVIX) 75 MG tablet, TAKE 1 TABLET(75 MG) BY MOUTH DAILY, Disp: 90 tablet, Rfl: 0   hydrALAZINE (APRESOLINE) 25 MG tablet, Take 1  tablet (25 mg total) by mouth 3 (three) times daily., Disp: 270 tablet, Rfl: 1   hydrOXYzine (ATARAX) 10 MG tablet, Take 1 tablet (10 mg total) by mouth every 8 (eight) hours as needed for itching., Disp: 30 tablet, Rfl: 0   levocetirizine (XYZAL) 5 MG tablet, Take 1 tablet (5 mg total) by mouth every evening., Disp: 30 tablet, Rfl: 5   losartan (COZAAR) 50 MG tablet, Take 1 tablet  (50 mg total) by mouth daily., Disp: 90 tablet, Rfl: 3   meclizine (ANTIVERT) 12.5 MG tablet, Take 1 tablet (12.5 mg total) by mouth 3 (three) times daily as needed for dizziness., Disp: 30 tablet, Rfl: 0   metoprolol succinate (TOPROL-XL) 25 MG 24 hr tablet, Take 1 tablet (25 mg total) by mouth daily., Disp: 90 tablet, Rfl: 3   montelukast (SINGULAIR) 10 MG tablet, Take 1 tablet (10 mg total) by mouth at bedtime., Disp: 90 tablet, Rfl: 1   Multiple Vitamins-Minerals (MULTIVITAMIN WITH MINERALS) tablet, Take 1 tablet by mouth daily., Disp: , Rfl:    mycophenolate (CELLCEPT) 500 MG tablet, Take 2 tablets (1,000 mg total) by mouth 2 (two) times daily. HOLD until neurosurgery is ok to restart this (waiting for incision to heal) (Patient taking differently: Take 500 mg by mouth 2 (two) times daily. HOLD until neurosurgery is ok to restart this (waiting for incision to heal)), Disp: , Rfl:    oxybutynin (DITROPAN XL) 15 MG 24 hr tablet, Take 1 tablet (15 mg total) by mouth daily., Disp: 30 tablet, Rfl: 11   pantoprazole (PROTONIX) 20 MG tablet, Take 1 tablet (20 mg total) by mouth daily., Disp: 90 tablet, Rfl: 3   rosuvastatin (CRESTOR) 20 MG tablet, Take 1 tablet (20 mg total) by mouth daily., Disp: 90 tablet, Rfl: 1   sertraline (ZOLOFT) 100 MG tablet, Take 1.5 tablets (150 mg total) by mouth daily., Disp: 135 tablet, Rfl: 1   tadalafil (CIALIS) 20 MG tablet, Take 1 tablet (20 mg total) by mouth daily as needed for erectile dysfunction. Avoid taking it if low blood pressure, Disp: , Rfl:    tamsulosin (FLOMAX) 0.4 MG CAPS capsule, Take 1 capsule (0.4 mg total) by mouth daily., Disp: 30 capsule, Rfl: 11   Allergies  Allergen Reactions   Enalapril Other (See Comments)    Unknown reaction   Vicodin [Hydrocodone-Acetaminophen] Hives and Rash    Severe headaches (also) NAME BRAND ONLY PER PT CAN TAKE GENERIC     Social History   Tobacco Use   Smoking status: Never   Smokeless tobacco: Never   Vaping Use   Vaping status: Never Used  Substance Use Topics   Alcohol use: No   Drug use: No    Comment: PT DENIES      Chart Review Today: I personally reviewed active problem list, medication list, allergies, family history, social history, health maintenance, notes from last encounter, lab results, imaging with the patient/caregiver today.   Review of Systems  Constitutional: Negative.   HENT: Negative.    Eyes: Negative.   Respiratory: Negative.    Cardiovascular: Negative.   Gastrointestinal: Negative.   Endocrine: Negative.   Genitourinary: Negative.   Musculoskeletal: Negative.   Skin: Negative.   Allergic/Immunologic: Negative.   Neurological: Negative.   Hematological: Negative.   Psychiatric/Behavioral: Negative.    All other systems reviewed and are negative.      Objective:   Vitals:   04/04/24 1554  BP: 130/74  Pulse: 91  Resp: 16  SpO2: 99%  Weight: 223 lb (101.2 kg)  Height: 6\' 2"  (1.88 m)    Body mass index is 28.63 kg/m.  Physical Exam Vitals and nursing note reviewed.  Constitutional:      General: He is not in acute distress.    Appearance: Normal appearance. He is well-developed. He is not ill-appearing, toxic-appearing or diaphoretic.  HENT:     Head: Normocephalic and atraumatic.     Right Ear: Tympanic membrane, ear canal and external ear normal. There is no impacted cerumen.     Left Ear: Tympanic membrane, ear canal and external ear normal. There is no impacted cerumen.     Nose: Nose normal.  Eyes:     General:        Right eye: No discharge.        Left eye: No discharge.     Conjunctiva/sclera: Conjunctivae normal.  Neck:     Trachea: No tracheal deviation.  Cardiovascular:     Rate and Rhythm: Normal rate.  Pulmonary:     Effort: Pulmonary effort is normal. No respiratory distress.     Breath sounds: No stridor.  Musculoskeletal:     Right lower leg: Edema present.     Left lower leg: Edema present.  Skin:     General: Skin is warm and dry.     Findings: No rash.  Neurological:     Mental Status: He is alert.     Motor: No abnormal muscle tone.     Coordination: Coordination normal.     Gait: Gait abnormal.  Psychiatric:        Behavior: Behavior normal.      Results for orders placed or performed in visit on 03/29/24  BLADDER SCAN AMB NON-IMAGING   Collection Time: 03/29/24  8:51 AM  Result Value Ref Range   Scan Result 0 ml    *Note: Due to a large number of results and/or encounters for the requested time period, some results have not been displayed. A complete set of results can be found in Results Review.       Assessment & Plan:   1. Otalgia, left (Primary) TM is normal appearing, transparent, normal cone of light, no effusion, no erythema - suspect EDT - fluticasone (FLONASE) 50 MCG/ACT nasal spray; Place 2 sprays into both nostrils daily.  Dispense: 16 g; Refill: 6  2. Eustachian tube dysfunction, left Tx with his current allergy meds - xyzal and singulair and add steroid nose spray and decongestant if tolerated - fluticasone (FLONASE) 50 MCG/ACT nasal spray; Place 2 sprays into both nostrils daily.  Dispense: 16 g; Refill: 6  3. Seasonal allergies Hx of severe seasonal allergies - sx may be due to change in seasons, worsening allergy sx, suggested adding claritin D in am if he tolerates and flonase  4. Rhinosinusitis Hx of recurrent sinusitis infections in the past - likely some EDT secondary to rhinitis/allergies       Adeline Hone, PA-C 04/04/24 4:13 PM

## 2024-04-07 DIAGNOSIS — F332 Major depressive disorder, recurrent severe without psychotic features: Secondary | ICD-10-CM | POA: Diagnosis not present

## 2024-04-12 DIAGNOSIS — I951 Orthostatic hypotension: Secondary | ICD-10-CM | POA: Diagnosis not present

## 2024-04-12 DIAGNOSIS — R569 Unspecified convulsions: Secondary | ICD-10-CM | POA: Diagnosis not present

## 2024-04-12 DIAGNOSIS — E538 Deficiency of other specified B group vitamins: Secondary | ICD-10-CM | POA: Diagnosis not present

## 2024-04-12 DIAGNOSIS — R7309 Other abnormal glucose: Secondary | ICD-10-CM | POA: Diagnosis not present

## 2024-04-12 DIAGNOSIS — S22079D Unspecified fracture of T9-T10 vertebra, subsequent encounter for fracture with routine healing: Secondary | ICD-10-CM | POA: Diagnosis not present

## 2024-04-12 DIAGNOSIS — G20B1 Parkinson's disease with dyskinesia, without mention of fluctuations: Secondary | ICD-10-CM | POA: Diagnosis not present

## 2024-04-13 DIAGNOSIS — M3214 Glomerular disease in systemic lupus erythematosus: Secondary | ICD-10-CM | POA: Diagnosis not present

## 2024-04-13 DIAGNOSIS — I10 Essential (primary) hypertension: Secondary | ICD-10-CM | POA: Diagnosis not present

## 2024-04-13 DIAGNOSIS — N182 Chronic kidney disease, stage 2 (mild): Secondary | ICD-10-CM | POA: Diagnosis not present

## 2024-04-13 DIAGNOSIS — R809 Proteinuria, unspecified: Secondary | ICD-10-CM | POA: Diagnosis not present

## 2024-04-14 ENCOUNTER — Institutional Professional Consult (permissible substitution): Admitting: Internal Medicine

## 2024-04-14 DIAGNOSIS — F332 Major depressive disorder, recurrent severe without psychotic features: Secondary | ICD-10-CM | POA: Diagnosis not present

## 2024-04-16 ENCOUNTER — Encounter: Payer: Self-pay | Admitting: Family Medicine

## 2024-04-16 DIAGNOSIS — H9202 Otalgia, left ear: Secondary | ICD-10-CM

## 2024-04-16 DIAGNOSIS — H6992 Unspecified Eustachian tube disorder, left ear: Secondary | ICD-10-CM

## 2024-04-18 DIAGNOSIS — M542 Cervicalgia: Secondary | ICD-10-CM | POA: Diagnosis not present

## 2024-04-18 DIAGNOSIS — Z79891 Long term (current) use of opiate analgesic: Secondary | ICD-10-CM | POA: Diagnosis not present

## 2024-04-18 DIAGNOSIS — M1990 Unspecified osteoarthritis, unspecified site: Secondary | ICD-10-CM | POA: Diagnosis not present

## 2024-04-18 DIAGNOSIS — M25511 Pain in right shoulder: Secondary | ICD-10-CM | POA: Diagnosis not present

## 2024-04-18 DIAGNOSIS — M328 Other forms of systemic lupus erythematosus: Secondary | ICD-10-CM | POA: Diagnosis not present

## 2024-04-18 DIAGNOSIS — M5413 Radiculopathy, cervicothoracic region: Secondary | ICD-10-CM | POA: Diagnosis not present

## 2024-04-18 DIAGNOSIS — G894 Chronic pain syndrome: Secondary | ICD-10-CM | POA: Diagnosis not present

## 2024-04-18 DIAGNOSIS — F419 Anxiety disorder, unspecified: Secondary | ICD-10-CM | POA: Diagnosis not present

## 2024-04-18 DIAGNOSIS — M25512 Pain in left shoulder: Secondary | ICD-10-CM | POA: Diagnosis not present

## 2024-04-18 DIAGNOSIS — M545 Low back pain, unspecified: Secondary | ICD-10-CM | POA: Diagnosis not present

## 2024-04-18 DIAGNOSIS — M6281 Muscle weakness (generalized): Secondary | ICD-10-CM | POA: Diagnosis not present

## 2024-04-19 ENCOUNTER — Ambulatory Visit (INDEPENDENT_AMBULATORY_CARE_PROVIDER_SITE_OTHER)

## 2024-04-19 ENCOUNTER — Ambulatory Visit (INDEPENDENT_AMBULATORY_CARE_PROVIDER_SITE_OTHER): Admitting: Vascular Surgery

## 2024-04-19 ENCOUNTER — Encounter (INDEPENDENT_AMBULATORY_CARE_PROVIDER_SITE_OTHER): Payer: Self-pay | Admitting: Vascular Surgery

## 2024-04-19 VITALS — BP 120/85 | HR 79 | Resp 18 | Ht 74.0 in | Wt 225.0 lb

## 2024-04-19 DIAGNOSIS — I10 Essential (primary) hypertension: Secondary | ICD-10-CM

## 2024-04-19 DIAGNOSIS — E785 Hyperlipidemia, unspecified: Secondary | ICD-10-CM

## 2024-04-19 DIAGNOSIS — I872 Venous insufficiency (chronic) (peripheral): Secondary | ICD-10-CM | POA: Diagnosis not present

## 2024-04-19 DIAGNOSIS — N1831 Chronic kidney disease, stage 3a: Secondary | ICD-10-CM | POA: Diagnosis not present

## 2024-04-19 DIAGNOSIS — I890 Lymphedema, not elsewhere classified: Secondary | ICD-10-CM | POA: Diagnosis not present

## 2024-04-19 DIAGNOSIS — I82402 Acute embolism and thrombosis of unspecified deep veins of left lower extremity: Secondary | ICD-10-CM | POA: Diagnosis not present

## 2024-04-19 NOTE — Assessment & Plan Note (Signed)
 His duplex today shows only chronic appearing changes in the femoral veins bilaterally.  No acute DVT or superficial thrombophlebitis.  He has deep venous reflux bilaterally and reflux at the saphenofemoral junction with previous ablation on the right.  Only reflux at the saphenofemoral junction and most proximal saphenous vein on the left. No role for intervention with these findings.  Continue compression, elevation, and resume using the lymphedema pump.  A new prescription for compression socks was given due to the change in the size of his legs over time.

## 2024-04-19 NOTE — Assessment & Plan Note (Signed)
 Will resume lymphedema pump use at this time with no acute DVT.

## 2024-04-19 NOTE — Addendum Note (Signed)
 Addended by: Tsosie Gail on: 04/19/2024 08:28 AM   Modules accepted: Orders

## 2024-04-19 NOTE — Progress Notes (Signed)
 MRN : [id number]  Jonathon Snow is a 63 y.o. (November 22, 1961) male who presents with chief complaint of  Chief Complaint  Patient presents with   Follow-up    F/u 6-8 weeks  L venous   .  History of Present Illness: Patient returns today in follow up of his leg pain and swelling.  He still troubled by a lot of swelling in his leg particularly on the left side.  He denies any open wounds or infection.  No fevers or chills.  He has been recently not using his lymphedema pumps because of concerns for DVT.  His duplex today shows only chronic appearing changes in the femoral veins bilaterally.  No acute DVT or superficial thrombophlebitis.  He has deep venous reflux bilaterally and reflux at the saphenofemoral junction with previous ablation on the right.  Only reflux at the saphenofemoral junction and most proximal saphenous vein on the left.  Current Outpatient Medications  Medication Sig Dispense Refill   amLODipine  (NORVASC ) 10 MG tablet Take by mouth.     apixaban  (ELIQUIS ) 5 MG TABS tablet Take 1 tablet (5 mg total) by mouth 2 (two) times daily. 180 tablet 1   carbidopa-levodopa (SINEMET IR) 25-100 MG tablet Take by mouth.     cholecalciferol  (VITAMIN D3) 25 MCG (1000 UNIT) tablet Take 1,000 Units by mouth daily.     ciclopirox (PENLAC) 8 % solution Apply topically.     clonazePAM  (KLONOPIN ) 0.5 MG tablet Take 0.5 mg by mouth daily.     clopidogrel  (PLAVIX ) 75 MG tablet TAKE 1 TABLET(75 MG) BY MOUTH DAILY 90 tablet 0   fluticasone  (FLONASE ) 50 MCG/ACT nasal spray Place 2 sprays into both nostrils daily. 16 g 6   gabapentin  (NEURONTIN ) 100 MG capsule Take 100 mg by mouth 3 (three) times daily.     hydrALAZINE  (APRESOLINE ) 25 MG tablet Take 1 tablet (25 mg total) by mouth 3 (three) times daily. 270 tablet 1   hydrOXYzine  (ATARAX ) 10 MG tablet Take 1 tablet (10 mg total) by mouth every 8 (eight) hours as needed for itching. 30 tablet 0   levocetirizine (XYZAL ) 5 MG tablet Take 1 tablet (5  mg total) by mouth every evening. 30 tablet 5   losartan  (COZAAR ) 50 MG tablet Take 1 tablet (50 mg total) by mouth daily. 90 tablet 3   meclizine  (ANTIVERT ) 12.5 MG tablet Take 1 tablet (12.5 mg total) by mouth 3 (three) times daily as needed for dizziness. 30 tablet 0   metoprolol  succinate (TOPROL -XL) 25 MG 24 hr tablet Take 1 tablet (25 mg total) by mouth daily. 90 tablet 3   montelukast  (SINGULAIR ) 10 MG tablet Take 1 tablet (10 mg total) by mouth at bedtime. 90 tablet 1   Multiple Vitamins-Minerals (MULTIVITAMIN WITH MINERALS) tablet Take 1 tablet by mouth daily.     mycophenolate  (CELLCEPT ) 500 MG tablet Take 2 tablets (1,000 mg total) by mouth 2 (two) times daily. HOLD until neurosurgery is ok to restart this (waiting for incision to heal) (Patient taking differently: Take 500 mg by mouth 2 (two) times daily. HOLD until neurosurgery is ok to restart this (waiting for incision to heal))     oxybutynin  (DITROPAN  XL) 15 MG 24 hr tablet Take 1 tablet (15 mg total) by mouth daily. 30 tablet 11   pantoprazole  (PROTONIX ) 20 MG tablet Take 1 tablet (20 mg total) by mouth daily. 90 tablet 3   rosuvastatin  (CRESTOR ) 20 MG tablet Take 1 tablet (20 mg total) by  mouth daily. 90 tablet 1   sertraline  (ZOLOFT ) 100 MG tablet Take 1.5 tablets (150 mg total) by mouth daily. 135 tablet 1   tadalafil  (CIALIS ) 20 MG tablet Take 1 tablet (20 mg total) by mouth daily as needed for erectile dysfunction. Avoid taking it if low blood pressure     tamsulosin  (FLOMAX ) 0.4 MG CAPS capsule Take 1 capsule (0.4 mg total) by mouth daily. 30 capsule 11   No current facility-administered medications for this visit.    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   Overview:  superior sagittal sinus,  left transverse sinus and cortical veins    Closed left hip fracture, initial encounter (HCC) 02/18/2023   COVID-19 virus infection 12/2020   Depression    DVT (deep venous thrombosis) (HCC)    Dyspnea    GERD (gastroesophageal reflux disease)    Head injury 11/05/2023   Heart attack (HCC) 11/05/2023   Heel spur, left 02/16/2019   Heel spur, right 02/16/2019   Hematoma of groin 11/08/2023   Herpes zoster infection of lumbar region 02/20/2020   History of DVT (deep vein thrombosis) 03/22/2020   History of kidney stones    Hyperlipidemia    Hypertension    Long term current use of systemic steroids 11/08/2020   Lupus    Lymphedema 10/07/2018   Morbid obesity (HCC)    Opiate abuse, episodic (HCC) 02/26/2018   Osteoporosis    Pain and swelling of right lower extremity 02/03/2023   Pneumonia    PONV (postoperative nausea and vomiting)    Postphlebitic syndrome with ulcer, 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   Presence of IVC filter 03/22/2020   Removed   Pulmonary embolism (HCC)    Renal disorder    Stage III   Severe episode of recurrent major depressive disorder, 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   Shock circulatory (HCC) 11/06/2023   STEMI (ST elevation myocardial infarction) (HCC) 11/05/2023    Past Surgical History:  Procedure Laterality Date   ANKLE SURGERY Right    BACK SURGERY     BRONCHIAL WASHINGS N/A 11/01/2021   Procedure: BRONCHIAL WASHINGS;  Surgeon: Erskin Hearing, MD;  Location: [facility] ORS;  Service: Thoracic;  Laterality: N/A;   COLONOSCOPY WITH PROPOFOL  N/A 05/28/2020   Procedure: COLONOSCOPY WITH PROPOFOL ;  Surgeon: Luke Salaam, MD;  Location: [facility] ENDOSCOPY;  Service: Endoscopy;  Laterality: N/A;   COLONOSCOPY WITH PROPOFOL  N/A 07/01/2023   Procedure: COLONOSCOPY WITH PROPOFOL ;  Surgeon: Luke Salaam, MD;  Location: [facility] ENDOSCOPY;  Service: Gastroenterology;  Laterality: N/A;   CORONARY ULTRASOUND/IVUS N/A 11/05/2023   Procedure: Coronary Ultrasound/IVUS;   Surgeon: Sammy Crisp, MD;  Location: [facility] INVASIVE CV LAB;  Service: Cardiovascular;  Laterality: N/A;   CORONARY/GRAFT ACUTE MI REVASCULARIZATION N/A 11/05/2023   Procedure: Coronary/Graft Acute MI Revascularization;  Surgeon: Sammy Crisp, MD;  Location: [facility] INVASIVE CV LAB;  Service: Cardiovascular;  Laterality: N/A;   CYST EXCISION  92 or 93    Liver cyst removal [facility]   FLEXIBLE BRONCHOSCOPY N/A 11/01/2021   Procedure: FLEXIBLE BRONCHOSCOPY;  Surgeon: Erskin Hearing, MD;  Location: [facility] ORS;  Service: Thoracic;  Laterality: N/A;   HIP PINNING,CANNULATED Left 02/19/2023   Procedure: PERCUTANEOUS FIXATION OF FEMORAL NECK;  Surgeon: Lorri Rota, MD;  Location: [facility] ORS;  Service: Orthopedics;  Laterality: Left;  I & D EXTREMITY Right 04/29/2017   Procedure: IRRIGATION AND DEBRIDEMENT EXTREMITY;  Surgeon: Gwyndolyn Lerner, MD;  Location: [facility] ORS;  Service: General;  Laterality: Right;   IRRIGATION AND DEBRIDEMENT ABSCESS Left 04/29/2017   Procedure: IRRIGATION AND DEBRIDEMENT Scrotal ABSCESS;  Surgeon: Gwyndolyn Lerner, MD;  Location: [facility] ORS;  Service: General;  Laterality: Left;   LEFT HEART CATH AND CORONARY ANGIOGRAPHY N/A 11/05/2023   Procedure: LEFT HEART CATH AND CORONARY ANGIOGRAPHY;  Surgeon: Sammy Crisp, MD;  Location: [facility] INVASIVE CV LAB;  Service: Cardiovascular;  Laterality: N/A;   POLYPECTOMY  07/01/2023   Procedure: POLYPECTOMY;  Surgeon: Luke Salaam, MD;  Location: [facility] ENDOSCOPY;  Service: Gastroenterology;;     Social History   Tobacco Use   Smoking status: Never   Smokeless tobacco: Never  Vaping Use   Vaping status: Never Used  Substance Use Topics   Alcohol use: No   Drug use: No    Comment: PT DENIES      Family History  Problem Relation Age of Onset   Hypertension Father    Heart disease Father    Clotting disorder Mother    Kidney disease Brother    Heart attack Maternal Grandmother    Heart attack Maternal Grandfather    Heart  attack Paternal Grandfather      Allergies  Allergen Reactions   Enalapril Other (See Comments)    Unknown reaction   Vicodin [Hydrocodone -Acetaminophen ] Hives and Rash    Severe headaches (also) NAME BRAND ONLY PER PT CAN TAKE GENERIC       REVIEW OF SYSTEMS (Negative unless checked)   Constitutional: [] Weight loss  [] Fever  [] Chills Cardiac: [] Chest pain   [] Chest pressure   [] Palpitations   [] Shortness of breath when laying flat   [] Shortness of breath at rest   [] Shortness of breath with exertion. Vascular:  [] Pain in legs with walking   [] Pain in legs at rest   [] Pain in legs when laying flat   [] Claudication   [] Pain in feet when walking  [] Pain in feet at rest  [] Pain in feet when laying flat   [x] History of DVT   [] Phlebitis   [x] Swelling in legs   [x] Varicose veins   [] Non-healing ulcers Pulmonary:   [] Uses home oxygen   [] Productive cough   [] Hemoptysis   [] Wheeze  [] COPD   [] Asthma Neurologic:  [] Dizziness  [] Blackouts   [] Seizures   [] History of stroke   [] History of TIA  [] Aphasia   [] Temporary blindness   [] Dysphagia   [] Weakness or numbness in arms   [] Weakness or numbness in legs Musculoskeletal:  [x] Arthritis   [] Joint swelling   [] Joint pain   [] Low back pain Hematologic:  [] Easy bruising  [] Easy bleeding   [] Hypercoagulable state   [] Anemic   Gastrointestinal:  [] Blood in stool   [] Vomiting blood  [] Gastroesophageal reflux/heartburn   [] Abdominal pain Genitourinary:  [x] Chronic kidney disease   [] Difficult urination  [] Frequent urination  [] Burning with urination   [] Hematuria Skin:  [] Rashes   [] Ulcers   [] Wounds Psychological:  [] History of anxiety   []  History of major depression.    Physical Examination  BP 120/85   Pulse 79   Resp 18   Ht 6\' 2"  (1.88 m)   Wt 225 lb (102.1 kg)   BMI 28.89 kg/m  Gen:  WD/WN, NAD Head: [place]/AT, No temporalis wasting. Ear/Nose/Throat: Hearing grossly intact, nares w/o erythema or drainage Eyes: Conjunctiva clear. Sclera  non-icteric Neck: Supple.  Trachea midline Pulmonary:  Good  air movement, no use of accessory muscles.  Cardiac: RRR, no JVD Vascular:  Vessel Right Left  Radial Palpable Palpable                   Musculoskeletal: M/S 5/5 throughout.  No deformity or atrophy.  1+ right lower extremity edema, 1-2+ left lower extremity edema.  Moderate to severe stasis dermatitis changes present worse on the left than the right Neurologic: Sensation grossly intact in extremities.  Symmetrical.  Speech is fluent.  Psychiatric: Judgment intact, Mood & affect appropriate for pt's clinical situation. Dermatologic: No rashes or ulcers noted.  No cellulitis or open wounds.      Labs Recent Results (from the past 2160 hours)  Basic metabolic panel     Status: Abnormal   Collection Time: 01/20/24 12:13 PM  Result Value Ref Range   Sodium 138 135 - 145 mmol/L   Potassium 4.0 3.5 - 5.1 mmol/L   Chloride 106 98 - 111 mmol/L   CO2 21 (L) 22 - 32 mmol/L   Glucose, Bld 111 (H) 70 - 99 mg/dL    Comment: Glucose reference range applies only to samples taken after fasting for at least 8 hours.   BUN 15 8 - 23 mg/dL   Creatinine, Ser 5.62 (H) 0.61 - 1.24 mg/dL   Calcium  9.2 8.9 - 10.3 mg/dL   GFR, Estimated 59 (L) >60 mL/min    Comment: (NOTE) Calculated using the CKD-EPI Creatinine Equation (2021)    Anion gap 11 5 - 15    Comment: Performed at [facility], [address] [place], [name] [address]  Basic metabolic panel     Status: Abnormal   Collection Time: 02/10/24 11:52 AM  Result Value Ref Range   Glucose 102 (H) 70 - 99 mg/dL   BUN 21 8 - 27 mg/dL   Creatinine, Ser 5.78 (H) 0.76 - 1.27 mg/dL   eGFR 63 >46 NG/EXB/2.84   BUN/Creatinine Ratio 16 10 - 24   Sodium 138 134 - 144 mmol/L   Potassium 4.7 3.5 - 5.2 mmol/L   Chloride 102 96 - 106 mmol/L   CO2 20 20 - 29 mmol/L   Calcium  9.3 8.6 - 10.2 mg/dL  CBC with Differential (Cancer Center Only)     Status: Abnormal   Collection  Time: 02/16/24  9:41 AM  Result Value Ref Range   WBC Count 5.4 4.0 - 10.5 K/uL   RBC 4.44 4.22 - 5.81 MIL/uL   Hemoglobin 12.8 (L) 13.0 - 17.0 g/dL   HCT 13.2 44.0 - 10.2 %   MCV 90.5 80.0 - 100.0 fL   MCH 28.8 26.0 - 34.0 pg   MCHC 31.8 30.0 - 36.0 g/dL   RDW 72.5 36.6 - 44.0 %   Platelet Count 181 150 - 400 K/uL   nRBC 0.0 0.0 - 0.2 %   Neutrophils Relative % 65 %   Neutro Abs 3.5 1.7 - 7.7 K/uL   Lymphocytes Relative 21 %   Lymphs Abs 1.1 0.7 - 4.0 K/uL   Monocytes Relative 11 %   Monocytes Absolute 0.6 0.1 - 1.0 K/uL   Eosinophils Relative 3 %   Eosinophils Absolute 0.1 0.0 - 0.5 K/uL   Basophils Relative 0 %   Basophils Absolute 0.0 0.0 - 0.1 K/uL   Immature Granulocytes 0 %   Abs Immature Granulocytes 0.02 0.00 - 0.07 K/uL    Comment: Performed at [facility], [address] [place], [name] [address]  Comprehensive metabolic panel     Status: Abnormal   Collection Time: 02/16/24  9:41 AM  Result Value Ref Range   Sodium 136 135 - 145 mmol/L   Potassium 4.2 3.5 - 5.1 mmol/L   Chloride 106 98 - 111 mmol/L   CO2 21 (L) 22 - 32 mmol/L   Glucose, Bld 94 70 - 99 mg/dL    Comment: Glucose reference range applies only to samples taken after fasting for at least 8 hours.   BUN 31 (H) 8 - 23 mg/dL   Creatinine, Ser 1.61 (H) 0.61 - 1.24 mg/dL   Calcium  8.9 8.9 - 10.3 mg/dL   Total Protein 7.2 6.5 - 8.1 g/dL   Albumin  3.7 3.5 - 5.0 g/dL   AST 15 15 - 41 U/L   ALT 15 0 - 44 U/L   Alkaline Phosphatase 91 38 - 126 U/L   Total Bilirubin 0.6 0.0 - 1.2 mg/dL   GFR, Estimated 53 (L) >60 mL/min    Comment: (NOTE) Calculated using the CKD-EPI Creatinine Equation (2021)    Anion gap 9 5 - 15    Comment: Performed at [facility], [address] [place], [name] [address]  Basic metabolic panel     Status: Abnormal   Collection Time: 02/25/24  4:08 PM  Result Value Ref Range   Sodium 137 135 - 145 mmol/L   Potassium 4.0 3.5 - 5.1 mmol/L   Chloride 108 98 - 111  mmol/L   CO2 19 (L) 22 - 32 mmol/L   Glucose, Bld 107 (H) 70 - 99 mg/dL    Comment: Glucose reference range applies only to samples taken after fasting for at least 8 hours.   BUN 26 (H) 8 - 23 mg/dL   Creatinine, Ser 5.40 (H) 0.61 - 1.24 mg/dL   Calcium  9.0 8.9 - 10.3 mg/dL   GFR, Estimated 58 (L) >60 mL/min    Comment: (NOTE) Calculated using the CKD-EPI Creatinine Equation (2021)    Anion gap 10 5 - 15    Comment: Performed at [facility], [address] [place], [name] [address]  CBC     Status: Abnormal   Collection Time: 02/25/24  4:08 PM  Result Value Ref Range   WBC 3.8 (L) 4.0 - 10.5 K/uL   RBC 4.49 4.22 - 5.81 MIL/uL   Hemoglobin 13.0 13.0 - 17.0 g/dL   HCT 14.7 82.9 - 56.2 %   MCV 89.8 80.0 - 100.0 fL   MCH 29.0 26.0 - 34.0 pg   MCHC 32.3 30.0 - 36.0 g/dL   RDW 13.0 86.5 - 78.4 %   Platelets 180 150 - 400 K/uL   nRBC 0.0 0.0 - 0.2 %    Comment: Performed at [facility], [address], [place], [name] [address]  Troponin I (High Sensitivity)     Status: None   Collection Time: 02/25/24  4:08 PM  Result Value Ref Range   Troponin I (High Sensitivity) 6 <18 ng/L    Comment: (NOTE) Elevated high sensitivity troponin I (hsTnI) values and significant  changes across serial measurements may suggest ACS but many other  chronic and acute conditions are known to elevate hsTnI results.  Refer to the "Links" section for chest pain algorithms and additional  guidance. Performed at [facility], [address] [place], [name] [address]   CBG monitoring, ED     Status: None   Collection Time: 02/25/24  7:05 PM  Result Value Ref  Range   Glucose-Capillary 87 70 - 99 mg/dL    Comment: Glucose reference range applies only to samples taken after fasting for at least 8 hours.  Troponin I (High Sensitivity)     Status: None   Collection Time: 02/25/24  7:24 PM  Result Value Ref Range   Troponin I (High Sensitivity) 7 <18 ng/L    Comment:  (NOTE) Elevated high sensitivity troponin I (hsTnI) values and significant  changes across serial measurements may suggest ACS but many other  chronic and acute conditions are known to elevate hsTnI results.  Refer to the "Links" section for chest pain algorithms and additional  guidance. Performed at [facility], [address] [place], [name] [address]   Urinalysis, Routine w reflex microscopic -Urine, Clean Catch     Status: Abnormal   Collection Time: 02/25/24  7:39 PM  Result Value Ref Range   Color, Urine YELLOW (A) YELLOW   APPearance CLEAR (A) CLEAR   Specific Gravity, Urine 1.012 1.005 - 1.030   pH 5.0 5.0 - 8.0   Glucose, UA NEGATIVE NEGATIVE mg/dL   Hgb urine dipstick NEGATIVE NEGATIVE   Bilirubin Urine NEGATIVE NEGATIVE   Ketones, ur NEGATIVE NEGATIVE mg/dL   Protein, ur 30 (A) NEGATIVE mg/dL   Nitrite NEGATIVE NEGATIVE   Leukocytes,Ua NEGATIVE NEGATIVE   RBC / HPF 0-5 0 - 5 RBC/hpf   WBC, UA 0-5 0 - 5 WBC/hpf   Bacteria, UA NONE SEEN NONE SEEN   Squamous Epithelial / HPF 0-5 0 - 5 /HPF   Mucus PRESENT     Comment: Performed at [facility], [address] [place], [name] [address]  Glucose, capillary     Status: Abnormal   Collection Time: 02/26/24  4:03 AM  Result Value Ref Range   Glucose-Capillary 136 (H) 70 - 99 mg/dL    Comment: Glucose reference range applies only to samples taken after fasting for at least 8 hours.  Basic metabolic panel     Status: Abnormal   Collection Time: 02/26/24  5:01 AM  Result Value Ref Range   Sodium 137 135 - 145 mmol/L   Potassium 3.5 3.5 - 5.1 mmol/L   Chloride 109 98 - 111 mmol/L   CO2 18 (L) 22 - 32 mmol/L   Glucose, Bld 145 (H) 70 - 99 mg/dL    Comment: Glucose reference range applies only to samples taken after fasting for at least 8 hours.   BUN 28 (H) 8 - 23 mg/dL   Creatinine, Ser 9.14 (H) 0.61 - 1.24 mg/dL   Calcium  9.0 8.9 - 10.3 mg/dL   GFR, Estimated 48 (L) >60 mL/min    Comment:  (NOTE) Calculated using the CKD-EPI Creatinine Equation (2021)    Anion gap 10 5 - 15    Comment: Performed at [facility], [address] [place], [name] [address]  CBC     Status: Abnormal   Collection Time: 02/26/24  5:01 AM  Result Value Ref Range   WBC 3.4 (L) 4.0 - 10.5 K/uL   RBC 3.97 (L) 4.22 - 5.81 MIL/uL   Hemoglobin 11.3 (L) 13.0 - 17.0 g/dL   HCT 62.1 (L) 30.8 - 65.7 %   MCV 88.2 80.0 - 100.0 fL   MCH 28.5 26.0 - 34.0 pg   MCHC 32.3 30.0 - 36.0 g/dL   RDW 84.6 96.2 - 95.2 %   Platelets 158 150 - 400 K/uL   nRBC 0.0 0.0 - 0.2 %  Comment: Performed at [facility], [address] [place], [name] [address]  CBC with Differential/Platelet     Status: Abnormal   Collection Time: 02/27/24  8:07 AM  Result Value Ref Range   WBC 3.6 (L) 4.0 - 10.5 K/uL   RBC 4.01 (L) 4.22 - 5.81 MIL/uL   Hemoglobin 11.3 (L) 13.0 - 17.0 g/dL   HCT 64.3 (L) 32.9 - 51.8 %   MCV 87.0 80.0 - 100.0 fL   MCH 28.2 26.0 - 34.0 pg   MCHC 32.4 30.0 - 36.0 g/dL   RDW 84.1 66.0 - 63.0 %   Platelets 163 150 - 400 K/uL   nRBC 0.0 0.0 - 0.2 %   Neutrophils Relative % 55 %   Neutro Abs 2.0 1.7 - 7.7 K/uL   Lymphocytes Relative 19 %   Lymphs Abs 0.7 0.7 - 4.0 K/uL   Monocytes Relative 18 %   Monocytes Absolute 0.7 0.1 - 1.0 K/uL   Eosinophils Relative 8 %   Eosinophils Absolute 0.3 0.0 - 0.5 K/uL   Basophils Relative 0 %   Basophils Absolute 0.0 0.0 - 0.1 K/uL   Immature Granulocytes 0 %   Abs Immature Granulocytes 0.01 0.00 - 0.07 K/uL    Comment: Performed at [facility], [address] [place], [name] [address]  Basic metabolic panel     Status: Abnormal   Collection Time: 02/27/24  8:07 AM  Result Value Ref Range   Sodium 136 135 - 145 mmol/L   Potassium 4.0 3.5 - 5.1 mmol/L   Chloride 110 98 - 111 mmol/L   CO2 20 (L) 22 - 32 mmol/L   Glucose, Bld 91 70 - 99 mg/dL    Comment: Glucose reference range applies only to samples taken after fasting for at  least 8 hours.   BUN 30 (H) 8 - 23 mg/dL   Creatinine, Ser 9.32 (H) 0.61 - 1.24 mg/dL   Calcium  8.6 (L) 8.9 - 10.3 mg/dL   GFR, Estimated 50 (L) >60 mL/min    Comment: (NOTE) Calculated using the CKD-EPI Creatinine Equation (2021)    Anion gap 6 5 - 15    Comment: Performed at [facility], [address] [place], [name] [address]  Glucose, capillary     Status: Abnormal   Collection Time: 02/27/24  9:03 AM  Result Value Ref Range   Glucose-Capillary 137 (H) 70 - 99 mg/dL    Comment: Glucose reference range applies only to samples taken after fasting for at least 8 hours.  Urinalysis, Complete     Status: Abnormal   Collection Time: 03/01/24  2:38 PM  Result Value Ref Range   Specific Gravity, UA 1.015 1.005 - 1.030   pH, UA 5.5 5.0 - 7.5   Color, UA Yellow Yellow   Appearance Ur Clear Clear   Leukocytes,UA Negative Negative   Protein,UA 2+ (A) Negative/Trace   Glucose, UA Negative Negative   Ketones, UA Negative Negative   RBC, UA Negative Negative   Bilirubin, UA Negative Negative   Urobilinogen, Ur 0.2 0.2 - 1.0 mg/dL   Nitrite, UA Negative Negative   Microscopic Examination See below:   Microscopic Examination     Status: Abnormal   Collection Time: 03/01/24  2:38 PM   Urine  Result Value Ref Range   WBC, UA 0-5 0 - 5 /hpf   RBC, Urine 0-2 0 - 2 /hpf   Epithelial Cells (non renal) 0-10 0 - 10 /hpf  Casts Present (A) None seen /lpf   Cast Type Hyaline casts N/A   Bacteria, UA Few None seen/Few  BLADDER SCAN AMB NON-IMAGING     Status: None   Collection Time: 03/01/24  2:58 PM  Result Value Ref Range   Scan Result 0ml   PSA     Status: None   Collection Time: 03/18/24  8:07 AM  Result Value Ref Range   Prostate Specific Ag, Serum 0.4 0.0 - 4.0 ng/mL    Comment: Roche ECLIA methodology. According to the American Urological Association, Serum PSA should decrease and remain at undetectable levels after radical prostatectomy. The AUA defines biochemical  recurrence as an initial PSA value 0.2 ng/mL or greater followed by a subsequent confirmatory PSA value 0.2 ng/mL or greater. Values obtained with different assay methods or kits cannot be used interchangeably. Results cannot be interpreted as absolute evidence of the presence or absence of malignant disease.   BLADDER SCAN AMB NON-IMAGING     Status: None   Collection Time: 03/29/24  8:51 AM  Result Value Ref Range   Scan Result 0 ml     Radiology No results found.  Assessment/Plan  Chronic venous insufficiency His duplex today shows only chronic appearing changes in the femoral veins bilaterally.  No acute DVT or superficial thrombophlebitis.  He has deep venous reflux bilaterally and reflux at the saphenofemoral junction with previous ablation on the right.  Only reflux at the saphenofemoral junction and most proximal saphenous vein on the left. No role for intervention with these findings.  Continue compression, elevation, and resume using the lymphedema pump.  A new prescription for compression socks was given due to the change in the size of his legs over time.  Lymphedema Will resume lymphedema pump use at this time with no acute DVT.  Benign essential hypertension blood pressure control important in reducing the progression of atherosclerotic disease. On appropriate oral medications.     Stage 3a chronic kidney disease (HCC) May worsen LE swelling   Mixed hyperlipidemia lipid control important in reducing the progression of atherosclerotic disease. Continue statin therapy  Mikki Alexander, MD  04/19/2024 12:26 PM    This note was created with Dragon medical transcription system.  Any errors from dictation are purely unintentional

## 2024-04-20 DIAGNOSIS — E538 Deficiency of other specified B group vitamins: Secondary | ICD-10-CM | POA: Diagnosis not present

## 2024-04-21 DIAGNOSIS — F332 Major depressive disorder, recurrent severe without psychotic features: Secondary | ICD-10-CM | POA: Diagnosis not present

## 2024-04-23 DIAGNOSIS — R569 Unspecified convulsions: Secondary | ICD-10-CM | POA: Diagnosis not present

## 2024-04-25 ENCOUNTER — Other Ambulatory Visit: Payer: Self-pay

## 2024-04-25 DIAGNOSIS — S22078D Other fracture of T9-T10 vertebra, subsequent encounter for fracture with routine healing: Secondary | ICD-10-CM

## 2024-04-26 ENCOUNTER — Ambulatory Visit
Admission: RE | Admit: 2024-04-26 | Discharge: 2024-04-26 | Disposition: A | Discharge status: Home / Self Care | Attending: Neurosurgery | Admitting: Neurosurgery

## 2024-04-26 ENCOUNTER — Encounter: Payer: Self-pay | Admitting: Neurosurgery

## 2024-04-26 ENCOUNTER — Ambulatory Visit
Admission: RE | Admit: 2024-04-26 | Discharge: 2024-04-26 | Disposition: A | Discharge status: Home / Self Care | Source: Ambulatory Visit | Attending: Neurosurgery | Admitting: Neurosurgery

## 2024-04-26 ENCOUNTER — Ambulatory Visit: Payer: Medicaid Other | Admitting: Neurosurgery

## 2024-04-26 VITALS — BP 132/94 | Ht 74.0 in | Wt 225.0 lb

## 2024-04-26 DIAGNOSIS — S22078D Other fracture of T9-T10 vertebra, subsequent encounter for fracture with routine healing: Secondary | ICD-10-CM

## 2024-04-26 DIAGNOSIS — G8929 Other chronic pain: Secondary | ICD-10-CM

## 2024-04-26 DIAGNOSIS — M545 Low back pain, unspecified: Secondary | ICD-10-CM

## 2024-04-26 DIAGNOSIS — R55 Syncope and collapse: Secondary | ICD-10-CM

## 2024-04-26 DIAGNOSIS — M5412 Radiculopathy, cervical region: Secondary | ICD-10-CM

## 2024-04-26 DIAGNOSIS — Z4789 Encounter for other orthopedic aftercare: Secondary | ICD-10-CM | POA: Diagnosis not present

## 2024-04-26 NOTE — Progress Notes (Signed)
   REFERRING PHYSICIAN:  Adeline Hone, Pa-c [address] [place],  [name] [address]  DOS: 11/06/23, T7-12 Posterior fusion   HISTORY OF PRESENT ILLNESS: Jonathon Snow is s/p above surgery.    He has been dealing with significant back, neck, and arm pain.  Is worse on the left than the right.  He additionally has been dealing with some syncope.  He would like to start rehab, but is felt lightheaded at times.  He has not had a true syncopal episode in 2 months.  He sees Dr. Starlin Echevaria for medical management.  He is interested in injections if possible.    PHYSICAL EXAMINATION:  Vitals:   04/26/24 0913  BP: (!) 132/94       NEUROLOGICAL:   Awake, alert, oriented to person, place, and time.   Moves all extremities well.  5/5 BUE except B grip and L triceps 4+. BLE 5/5  SILT   ROS (Neurologic):  Negative except as noted above   MRI C spine 02/26/2024 IMPRESSION: 1. 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 2. At C5-C6, mild to moderate canal and bilateral foraminal stenosis. 3. At C4-C5, mild canal and bilateral foraminal stenosis.     Electronically Signed   By: Stevenson Elbe M.D.   On: 02/27/2024 02:42  ASSESSMENT/PLAN:  Jonathon Snow is s/p above surgery.  He is doing reasonably well from this, but has developed symptoms of cervical radiculopathy as well as low back pain.  He is interested in interventional procedures, so I will refer him to Dr. Rhesa Celeste for this.  He will continue to see Dr. Starlin Echevaria for medical management.  I will refer him for physical therapy.  I will see him back in 2 months.   I spent a total of 15 minutes in this patient's care today. This time was spent reviewing pertinent records including imaging studies, obtaining and confirming history, performing a directed evaluation, formulating and discussing my recommendations, and documenting the visit within the medical record.     Jodeen Munch  MD Department of neurosurgery

## 2024-04-27 DIAGNOSIS — E538 Deficiency of other specified B group vitamins: Secondary | ICD-10-CM | POA: Diagnosis not present

## 2024-04-27 DIAGNOSIS — F332 Major depressive disorder, recurrent severe without psychotic features: Secondary | ICD-10-CM | POA: Diagnosis not present

## 2024-05-02 ENCOUNTER — Ambulatory Visit: Attending: Neurosurgery

## 2024-05-02 DIAGNOSIS — M542 Cervicalgia: Secondary | ICD-10-CM | POA: Diagnosis not present

## 2024-05-02 DIAGNOSIS — M545 Low back pain, unspecified: Secondary | ICD-10-CM | POA: Insufficient documentation

## 2024-05-02 DIAGNOSIS — R262 Difficulty in walking, not elsewhere classified: Secondary | ICD-10-CM | POA: Insufficient documentation

## 2024-05-02 DIAGNOSIS — R55 Syncope and collapse: Secondary | ICD-10-CM | POA: Insufficient documentation

## 2024-05-02 DIAGNOSIS — R42 Dizziness and giddiness: Secondary | ICD-10-CM | POA: Insufficient documentation

## 2024-05-02 DIAGNOSIS — G8929 Other chronic pain: Secondary | ICD-10-CM | POA: Diagnosis not present

## 2024-05-02 DIAGNOSIS — M5412 Radiculopathy, cervical region: Secondary | ICD-10-CM | POA: Diagnosis not present

## 2024-05-02 DIAGNOSIS — M5416 Radiculopathy, lumbar region: Secondary | ICD-10-CM | POA: Diagnosis not present

## 2024-05-02 DIAGNOSIS — M5459 Other low back pain: Secondary | ICD-10-CM | POA: Diagnosis not present

## 2024-05-02 NOTE — Therapy (Signed)
 OUTPATIENT PHYSICAL THERAPY THORACOLUMBAR EVALUATION   Patient Name: Jonathon Snow MRN: [id number] DOB:01/05/1961, 63 y.o., male Today's Date: 05/02/2024  END OF SESSION:  PT End of Session - 05/02/24 0821     Visit Number 1    Number of Visits 17    Date for PT Re-Evaluation 07/01/24    PT Start Time 0821    PT Stop Time 0903    PT Time Calculation (min) 42 min    Activity Tolerance Patient tolerated treatment well;Patient limited by pain    Behavior During Therapy [facility] for tasks assessed/performed             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   Overview:  superior sagittal sinus, left transverse sinus and cortical veins    Closed left hip fracture, initial encounter (HCC) 02/18/2023   COVID-19 virus infection 12/2020   Depression    DVT (deep venous thrombosis) (HCC)    Dyspnea    GERD (gastroesophageal reflux disease)    Head injury 11/05/2023   Heart attack (HCC) 11/05/2023   Heel spur, left 02/16/2019   Heel spur, right 02/16/2019   Hematoma of groin 11/08/2023   Herpes zoster infection of lumbar region 02/20/2020   History of DVT (deep vein thrombosis) 03/22/2020   History of kidney stones    Hyperlipidemia    Hypertension    Long term current use of systemic steroids 11/08/2020   Lupus    Lymphedema 10/07/2018   Morbid obesity (HCC)    Opiate abuse, episodic (HCC) 02/26/2018   Osteoporosis    Pain and swelling of right lower extremity 02/03/2023   Pneumonia    PONV (postoperative nausea and vomiting)    Postphlebitic syndrome with ulcer, 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   Presence of IVC filter 03/22/2020   Removed   Pulmonary embolism (HCC)    Renal disorder    Stage III   Severe episode of recurrent major depressive disorder,  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   Shock circulatory (HCC) 11/06/2023   STEMI (ST elevation myocardial infarction) (HCC) 11/05/2023   Past Surgical History:  Procedure Laterality Date   ANKLE SURGERY Right    BACK SURGERY     BRONCHIAL WASHINGS N/A 11/01/2021   Procedure: BRONCHIAL WASHINGS;  Surgeon: Erskin Hearing, MD;  Location: [facility] ORS;  Service: Thoracic;  Laterality: N/A;   COLONOSCOPY WITH PROPOFOL  N/A 05/28/2020   Procedure: COLONOSCOPY WITH PROPOFOL ;  Surgeon: Luke Salaam, MD;  Location: [facility] ENDOSCOPY;  Service: Endoscopy;  Laterality: N/A;   COLONOSCOPY WITH PROPOFOL  N/A 07/01/2023   Procedure: COLONOSCOPY WITH PROPOFOL ;  Surgeon: Luke Salaam, MD;  Location: [facility] ENDOSCOPY;  Service: Gastroenterology;  Laterality: N/A;   CORONARY ULTRASOUND/IVUS N/A 11/05/2023   Procedure: Coronary Ultrasound/IVUS;  Surgeon: Sammy Crisp, MD;  Location: [facility] INVASIVE CV LAB;  Service: Cardiovascular;  Laterality: N/A;   CORONARY/GRAFT ACUTE MI REVASCULARIZATION N/A 11/05/2023   Procedure: Coronary/Graft Acute MI Revascularization;  Surgeon: Sammy Crisp, MD;  Location: [facility] INVASIVE CV LAB;  Service: Cardiovascular;  Laterality: N/A;   CYST EXCISION  92 or 93    Liver cyst removal [facility]   FLEXIBLE BRONCHOSCOPY N/A 11/01/2021   Procedure: FLEXIBLE BRONCHOSCOPY;  Surgeon: Erskin Hearing, MD;  Location: [facility] ORS;  Service: Thoracic;  Laterality: N/A;   HIP PINNING,CANNULATED Left  02/19/2023   Procedure: PERCUTANEOUS FIXATION OF FEMORAL NECK;  Surgeon: Lorri Rota, MD;  Location: [facility] ORS;  Service: Orthopedics;  Laterality: Left;   I & D EXTREMITY Right 04/29/2017   Procedure: IRRIGATION AND DEBRIDEMENT EXTREMITY;  Surgeon: Gwyndolyn Lerner, MD;  Location: [facility] ORS;  Service: General;  Laterality: Right;   IRRIGATION AND DEBRIDEMENT ABSCESS Left 04/29/2017   Procedure: IRRIGATION AND DEBRIDEMENT Scrotal ABSCESS;  Surgeon: Gwyndolyn Lerner, MD;  Location: [facility] ORS;  Service:  General;  Laterality: Left;   LEFT HEART CATH AND CORONARY ANGIOGRAPHY N/A 11/05/2023   Procedure: LEFT HEART CATH AND CORONARY ANGIOGRAPHY;  Surgeon: Sammy Crisp, MD;  Location: [facility] INVASIVE CV LAB;  Service: Cardiovascular;  Laterality: N/A;   POLYPECTOMY  07/01/2023   Procedure: POLYPECTOMY;  Surgeon: Luke Salaam, MD;  Location: [facility] ENDOSCOPY;  Service: Gastroenterology;;   Patient Active Problem List   Diagnosis Date Noted   Anxiety and depression 02/26/2024   Dyslipidemia 02/26/2024   Peripheral neuropathy 02/26/2024   Essential hypertension 02/26/2024   Vertigo 02/26/2024   Syncope 02/25/2024   Orthostatic hypotension 02/11/2024   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) 01/20/2024   Anxiety 12/14/2023   Overweight (BMI 25.0-29.9) 11/23/2023   Chronic heart failure with preserved ejection fraction (HFpEF) (HCC) 11/23/2023   BPH (benign prostatic hyperplasia) 11/23/2023   Unstable angina (HCC) 11/10/2023   Closed T10 spinal fracture (HCC) 11/06/2023   Closed tricolumnar fracture of thoracic vertebra (HCC) 11/06/2023   Thoracic spine instability 11/06/2023   Chronic bilateral low back pain with left-sided sciatica 07/07/2023   Neuropathy 07/07/2023   Hx of colonic polyps 07/01/2023   Adenomatous polyp of colon 07/01/2023   Erectile disorder 01/22/2023   Coronary artery disease involving native coronary artery of native heart without angina pectoris 09/09/2022   Varicose 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 01/03/2022   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) 09/18/2021   Prediabetes 11/08/2020   Stage 3a chronic kidney disease (HCC) 11/08/2020   Seasonal allergies 11/08/2020   Rhinosinusitis 11/08/2020   Anticoagulant disorder (HCC) 11/07/2020   Senile purpura (HCC) 11/07/2020   MDD (major depressive disorder), recurrent episode, mild (HCC) 09/12/2019   Other spondylosis with radiculopathy, lumbar region 02/16/2019    Osteoporosis 10/12/2018   Chronic venous insufficiency 10/07/2018   Lymphedema 10/07/2018   SLE glomerulonephritis syndrome, 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HCC) 03/03/2018   Syncope and collapse 12/29/2017   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) 10/29/2016   Hyperlipidemia LDL goal <70 01/15/2016   Uncontrolled hypertension 01/15/2016   Obesity (BMI 30.0-34.9) 01/15/2016   Long term current use of anticoagulant 10/04/2015   Systemic lupus erythematosus (HCC) 05/30/2015   COPD, moderate (HCC) 06/27/2014   Shortness of breath 06/27/2014   History of pulmonary embolism 12/11/2013   Nodule of right lung 12/11/2013   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   Cystic disease of liver 08/21/2013    PCP: Adeline Hone, PA-C   REFERRING PROVIDER: Jodeen Munch, MD  REFERRING DIAG: [phone] (ICD-10-CM) - Cervical radiculopathy M54.50,G89.29 (ICD-10-CM) - Chronic bilateral low back pain without sciatica  Rationale for Evaluation and Treatment: Rehabilitation  THERAPY DIAG:  Cervicalgia - Plan: PT plan of care cert/re-cert  Other low back pain - Plan: PT plan of care cert/re-cert  Radiculopathy, cervical region - Plan: PT plan of care cert/re-cert  Radiculopathy, lumbar region - Plan: PT plan of care cert/re-cert  Difficulty in walking, not elsewhere classified - Plan:  PT plan of care cert/re-cert  Dizziness and giddiness - Plan: PT plan of care cert/re-cert  ONSET DATE: November 2024  SUBJECTIVE:                                                                                                                                                                                           SUBJECTIVE STATEMENT: Neck: 9/10 at worst for the past 3 months Low back: 8/10 at most for the past 3 months.   PERTINENT HISTORY:  Cervicalgia, low back pain. Pt also reports R > L C5/C6 dermatome to elbow bilaterally. Pt has a difficult time turning his  head and looking up. No neck surgery. Had Thoracic and lumbar spine fusion surgery. Had mid back surgery (thoracic) November 2024 due to his fall. Neck pain is more recent. Neck and shoulder pain began November 2024 after his thoracic surgery. Usually sleeps on his L side but is currently difficulty to do secondary to B shoulder and neck pain, so pt is not sleeping well. Neck pain is the same since November 2024. Pt tends to have issues getting dizzy and passes out when he is standing and exerting himself  Vision gets blurry and splotchy. Room feels like it is waving around, pt is not walking on a stable surface, feels like he is walking on a boat and not having sea legs.   The IVC filter was removed.  Still has L LE DVT. Pt states taking Elequis. L LE DVT is still being treated.    PAIN:  Are you having pain? Yes: NPRS scale: 7/10 currently Pain location: neck (cervical paraspinals and upper trap) and B C5/C6 area to elbow Pain description: really really sore Aggravating factors: raising his arms up such as to reach, lifting with his arm, turning his head, looking up.  Relieving factors: Laying flat on his back on his bed with a pillow.    Pt also gets a stinging sensation at mid back around the T7 and above area, not all the time   Are you having pain? Yes: NPRS scale: 6/10 currently (pt sitting on a chair) Pain location: L low back pain; L L4 dermatome radiating symptoms to big toe.  Pain description: stinging, burning, ache Aggravating factors: sitting for long periods, about 15-20 minutes, trying to move around, bending lifting,   Relieving factors: Laying flat on his back, on the bed  PRECAUTIONS: dizzy and passes out when he is standing and exerting himself  L LE DVT, fall risk  RED FLAGS: Bowel or bladder incontinence: No and Cauda equina syndrome: No   WEIGHT BEARING RESTRICTIONS: No  FALLS:  Has  patient fallen in last 6 months? Yes. Number of falls 2 (original fall was  when pt had a heart attack November 2024 and hurt his back. February 22, 2024, pt passed out and fell into the bath tub.   LIVING ENVIRONMENT: Lives with: lives alone Lives in: House/apartment Stairs: No Has following equipment at home: Single point cane and Environmental consultant - 2 wheeled  OCCUPATION: Not currently working  PLOF: Needs assistance with homemaking   PATIENT GOALS: be able to do everything better.   NEXT MD VISIT: Yes but does not know when.   OBJECTIVE:  Note: Objective measures were completed at Evaluation unless otherwise noted.  DIAGNOSTIC FINDINGS:  MR CERVICAL SPINE WO CONTRAST 02/26/2024  Narrative & Impression  CLINICAL DATA:  Myelopathy, acute, cervical spine   EXAM: MRI CERVICAL SPINE WITHOUT CONTRAST   TECHNIQUE: Multiplanar, multisequence MR imaging of the cervical spine was performed. No intravenous contrast was administered.   COMPARISON:  CT of the cervical spine November 05, 2023.   FINDINGS: Alignment: No substantial sagittal subluxation.   Vertebrae: No evidence of acute fracture, evidence of discitis, or suspicious bone lesion. Benign vertebral venous malformation at T3. Mild T1-T2 height loss, unchanged.   Cord: Normal cord signal.  Neck   Posterior Fossa, vertebral arteries, paraspinal tissues: Visualized vertebral artery flow voids are maintained. No paraspinal edema.   Disc levels:   C2-C3: No significant disc protrusion, foraminal stenosis, or canal stenosis.   C3-C4: No significant disc protrusion, foraminal stenosis, or canal stenosis.   C4-C5: Right eccentric posterior disc osteophyte complex with right greater than left facet and uncovertebral hypertrophy. Resulting mild right greater than left foraminal stenosis. Mild canal stenosis.   C5-C6: Posterior disc osteophyte complex ligamentum flavum thickening and right greater than left facet and uncovertebral hypertrophy. Resulting mild-to-moderate bilateral foraminal stenosis. Mild  to moderate canal stenosis.   C6-C7: Posterior disc osteophyte complex with bilateral facet and uncovertebral hypertrophy. Resulting moderate bilateral foraminal stenosis. Mild canal stenosis.   C7-T1: No significant disc protrusion, foraminal stenosis, or canal stenosis.   IMPRESSION: 1. 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 2. At C5-C6, mild to moderate canal and bilateral foraminal stenosis. 3. At C4-C5, mild canal and bilateral foraminal stenosis.     Electronically Signed   By: Stevenson Elbe M.D.   On: 02/27/2024 02:42         PATIENT SURVEYS:  Neck Disability Index score: 41/50 (82%) (05/02/2024)  COGNITION: Overall cognitive status: Within functional limits for tasks assessed     SENSATION:   MUSCLE LENGTH:   POSTURE:   PALPATION: TTP to posterior neck   CERVICAL ROM:   Active ROM A/PROM (deg) eval  Flexion WFL with posterior neck pain  Extension Very limited with pain, movement preference around C5/C6 area  Right lateral flexion Very limited with pain  Left lateral flexion Very limited with pain  Right rotation 20 degrees with pain  Left rotation 22 degrees with pain   (Blank rows = not tested)  UPPER EXTREMITY MMT:  MMT Right eval Left eval  Shoulder flexion 3+ with neck and shoulder and arm pain 4- with pain at available range.   Shoulder extension    Shoulder abduction    Shoulder adduction    Shoulder extension    Shoulder internal rotation    Shoulder external rotation    Middle trapezius    Lower trapezius (seated manually resisted) 3+ with neck pain 3+ with neck pain  Elbow flexion  Elbow extension    Wrist flexion    Wrist extension    Wrist ulnar deviation    Wrist radial deviation    Wrist pronation    Wrist supination    Grip strength     (Blank rows = not tested)  UPPER EXTREMITY ROM:  Active ROM Right eval Left eval  Shoulder flexion 55 (scaption) 55 (scaption)  Shoulder  extension    Shoulder abduction    Shoulder adduction    Shoulder extension    Shoulder internal rotation    Shoulder external rotation    Elbow flexion    Elbow extension    Wrist flexion    Wrist extension    Wrist ulnar deviation    Wrist radial deviation    Wrist pronation    Wrist supination     (Blank rows = not tested)    LUMBAR ROM:   AROM eval  Flexion   Extension   Right lateral flexion   Left lateral flexion   Right rotation   Left rotation    (Blank rows = not tested)  LOWER EXTREMITY ROM:     Passive  Right eval Left eval  Hip flexion    Hip extension    Hip abduction    Hip adduction    Hip internal rotation    Hip external rotation    Knee flexion    Knee extension    Ankle dorsiflexion    Ankle plantarflexion    Ankle inversion    Ankle eversion     (Blank rows = not tested)  LOWER EXTREMITY MMT:    MMT Right eval Left eval  Hip flexion    Hip extension    Hip abduction    Hip adduction    Hip internal rotation    Hip external rotation    Knee flexion    Knee extension    Ankle dorsiflexion    Ankle plantarflexion    Ankle inversion    Ankle eversion     (Blank rows = not tested)  LUMBAR SPECIAL TESTS:    FUNCTIONAL TESTS:    GAIT: Distance walked: 30 ft Assistive device utilized: None Level of assistance: SBA Comments: antalgic, decreased stance R LE, forward flexed, decreased gait speed   TREATMENT DATE: 05/02/2024                                                                                                                               Blood pressure L arm sitting, mechanically taken, normal cuff: 127/77, HR 66   Then after standing for about 2 minutes: 110/77, HR 82   Pt was recommended to press his tongue at the roof of his mouth to activate his anterior cervical muscles throughout the day. Pt demonstrated and verbalized understanding.     PATIENT EDUCATION:  Education details: POC Person educated:  Patient Education method: Explanation Education comprehension: verbalized understanding  HOME EXERCISE PROGRAM: Pt was recommended  to press his tongue at the roof of his mouth to activate his anterior cervical muscles throughout the day. Pt demonstrated and verbalized understanding.   ASSESSMENT:  CLINICAL IMPRESSION: Patient is a 63 y.o. male who was seen today for physical therapy evaluation and treatment for neck, mid, and low back pain He also presents with B UE and L LE radiating symptoms, altered posture, altered gait pattern, TTP, irritability of symptoms, decreased trunk and UE strength, limited B shoulder and cervical AROM, and difficulty reaching, looking around, lifting, tolerating the sitting position, and moving around secondary to pain. Pt will benefit from skilled physical therapy services to address the aforementioned deficits.    OBJECTIVE IMPAIRMENTS: Abnormal gait, decreased balance, decreased endurance, difficulty walking, decreased ROM, decreased strength, dizziness, impaired UE functional use, improper body mechanics, postural dysfunction, and pain.   ACTIVITY LIMITATIONS: carrying, lifting, bending, sitting, standing, squatting, sleeping, stairs, transfers, bed mobility, bathing, toileting, dressing, reach over head, hygiene/grooming, locomotion level, and caring for others  PARTICIPATION LIMITATIONS:   PERSONAL FACTORS: Age, Fitness, Past/current experiences, Time since onset of injury/illness/exacerbation, and 3+ comorbidities: DVT, depression, dyspnea, heart attack, HTN, osteoporosis, are also affecting patient's functional outcome.   REHAB POTENTIAL: Fair    CLINICAL DECISION MAKING: Stable/uncomplicated  EVALUATION COMPLEXITY: Low   GOALS: Goals reviewed with patient? Yes  SHORT TERM GOALS: Target date: 05/13/2024  Pt will be independent with his initial HEP to improve strength, decrease pain, improve ability to lift, look around as well as ambulate and  perform standing tasks with less difficulty and pain. Baseline: Pt has not yet officially started his initial HEP (05/02/2024) Goal status: INITIAL    LONG TERM GOALS: Target date: 07/01/2024  Pt will have a decrease in neck pain to 5/10 or less at worst to promote ability to look around more comfortably.  Baseline: 9/10 neck pain at worst for the past 3 months (05/02/2024) Goal status: INITIAL  2.  Pt will have a decrease on low back pain to 4/10 or less at worst to promote ability to tolerate sitting, standing, walking and performing standing tasks more comfortably for his back.  Baseline: 8/10 low back pain at worst for  Goal status: INITIAL  3.  Pt will improve his Neck Disability Index (NDI) score by at least 20 % as a demonstration of improved function.  Baseline: Neck Disability Index score: 41/50 (82%) (05/02/2024) Goal status: INITIAL  4.  Pt will improve his cervical rotation AROM to 50 degrees or more to promote ability to look around more comfortably. Baseline:  Active ROM A/PROM (deg) eval  Right rotation 20 degrees with pain  Left rotation 22 degrees with pain   (05/02/2024)  Goal status: INITIAL  5.  Pt will improve B shoulder flexion AROM to 100 degrees or more to promote ability to reach as well as lift more comfortably.  Baseline:  Active ROM Right eval Left eval  Shoulder flexion 55 (scaption) 55 (scaption)   (05/02/2024)  Goal status: INITIAL  6.  Pt will improve his shoulder flexion and lower trap strength by at least 1/2 MMT grade to promote ability to raise his arms as well as lift with less difficulty.  Baseline:  MMT Right eval Left eval  Shoulder flexion 3+ with neck and shoulder and arm pain 4- with pain at available range.   Lower trapezius (seated manually resisted) 3+ with neck pain 3+ with neck pain   (05/02/2024)   Goal status: INITIAL   PLAN:  PT FREQUENCY: 1-2x/week  PT DURATION: 8 weeks  PLANNED INTERVENTIONS: 97110-Therapeutic  exercises, 97530- Therapeutic activity, [id number]- Neuromuscular re-education, 97535- Self Care, [id number]- Manual therapy, [phone]- Gait training, [phone]- Electrical stimulation (unattended), [phone]- Ionotophoresis 4mg /ml Dexamethasone , Patient/Family education, and Balance training.  PLAN FOR NEXT SESSION: posture, B scapular, anterior cervical, trunk and glute strengthening, mechanics, manual techniques, modalities PRN   Sammi Stolarz, PT, DPT 05/02/2024, 4:43 PM

## 2024-05-02 NOTE — Progress Notes (Unsigned)
 Electrophysiology Office Note:   Date:  05/03/2024  ID:  Jonathon Snow, DOB 01-Oct-1961, MRN [id number]  Primary Cardiologist: Sammy Crisp, MD Electrophysiologist: Ardeen Kohler, MD      History of Present Illness:   Jonathon Snow is a 63 y.o. male with h/o CAD s/p PCI with DES to distal LCX and CTO of pRCA, chronic diastolic heart failure, chronic venous insufficiency c/b orthostatic hypotension and presyncope who is being seen today for EP evaluation.   Discussed the use of AI scribe software for clinical note transcription with the patient, who gave verbal consent to proceed.  History of Present Illness He wore a Zio monitor for 13 days in April of 2025. Monitor showed normal average heart rate and normal heart rate variability. No symptom triggered episodes. No sustained arrhythmias. Patient estimates he has palpitations lasting for mere seconds occurring a couple of times a week. He has a history of orthostatic hypotension, leading to syncope. He is currently using compression socks and abdominal binders to manage these symptoms. He had diffuse pain which is chronic. No new or acute complaints.   Review of systems complete and found to be negative unless listed in HPI.   EP Information / Studies Reviewed:    EKG is ordered today. Personal review as below.  EKG Interpretation Date/Time:  Tuesday May 03 2024 07:59:09 EDT Ventricular Rate:  74 PR Interval:  162 QRS Duration:  94 QT Interval:  382 QTC Calculation: 424 R Axis:   52  Text Interpretation: Normal sinus rhythm Normal ECG When compared with ECG of 04-Apr-2024 13:33, No significant change was found Confirmed by Ardeen Kohler [phone]) on 05/03/2024 8:07:32 AM   Zio Monitor 10/2023:  Patch Wear Time:  13 days and 20 hours (2025-04-15T10:59:27-0400 to 2025-04-29T07:35:08-399)   Patient had a min HR of 59 bpm, max HR of 194 bpm, and avg HR of 79 bpm. Predominant underlying rhythm was Sinus Rhythm. 1 run of Ventricular  Tachycardia occurred lasting 6 beats with a max rate of 194 bpm (avg 123 bpm). 58 Supraventricular Tachycardia  runs occurred, the run with the fastest interval lasting 4 beats with a max rate of 190 bpm, the longest lasting 13 beats with an avg rate of 97 bpm. Isolated SVEs were occasional (1.3%, 20162), SVE Couplets were rare (<1.0%, 359), and SVE Triplets were  rare (<1.0%, 55). Isolated VEs were rare (<1.0%), VE Couplets were rare (<1.0%), and no VE Triplets were present. Ventricular Bigeminy and Trigeminy were present.   Echo 10/2023:   1. Left ventricular ejection fraction, by estimation, is 55 to 60%. The  left ventricle has normal function. The left ventricle has no regional  wall motion abnormalities. There is mild left ventricular hypertrophy.  Left ventricular diastolic parameters  are consistent with Grade I diastolic dysfunction (impaired relaxation).   2. Right ventricular systolic function is normal. The right ventricular  size is mildly enlarged. There is mildly elevated pulmonary artery  systolic pressure.   3. The mitral valve is normal in structure. No evidence of mitral valve  regurgitation.   4. The aortic valve is grossly normal. Aortic valve regurgitation is not  visualized.    Physical Exam:   VS:  BP 124/75 (BP Location: Left Arm)   Pulse 74   Ht 6\' 2"  (1.88 m)   Wt 221 lb 6.4 oz (100.4 kg)   SpO2 96%   BMI 28.43 kg/m    Wt Readings from Last 3 Encounters:  05/03/24 221 lb 6.4 oz (  100.4 kg)  04/26/24 225 lb (102.1 kg)  04/19/24 225 lb (102.1 kg)     GEN: Well nourished, well developed in no acute distress NECK: No JVD CARDIAC: Normal rate, regular rhythm RESPIRATORY:  Clear to auscultation without rales, wheezing or rhonchi  ABDOMEN: Soft, non-distended EXTREMITIES:  1+ pedal edema   ASSESSMENT AND PLAN:    #. Orthostasis: #. Venous insufficiency: #. Syncope: No sustained arrhythmias or bradycardia on Zio monitor. Likely orthostatic etiology.  -  Continue supportive measures with compression stockings, abdominal binder, adequate salt and fluid intake.  - General cardiology follow up.   #. PSVT: Longest 13 beats on Zio. Appears to be asymptomatic. - Continue low dose metoprolol  XL 25mg  daily.    Follow up with general cardiology. EP as needed.   Signed, Ardeen Kohler, MD

## 2024-05-03 ENCOUNTER — Ambulatory Visit: Attending: Cardiology | Admitting: Cardiology

## 2024-05-03 VITALS — BP 124/75 | HR 74 | Ht 74.0 in | Wt 221.4 lb

## 2024-05-03 DIAGNOSIS — I872 Venous insufficiency (chronic) (peripheral): Secondary | ICD-10-CM | POA: Insufficient documentation

## 2024-05-03 DIAGNOSIS — I471 Supraventricular tachycardia, unspecified: Secondary | ICD-10-CM | POA: Insufficient documentation

## 2024-05-03 DIAGNOSIS — R55 Syncope and collapse: Secondary | ICD-10-CM | POA: Insufficient documentation

## 2024-05-03 DIAGNOSIS — I951 Orthostatic hypotension: Secondary | ICD-10-CM | POA: Diagnosis not present

## 2024-05-03 NOTE — Patient Instructions (Addendum)
 Medication Instructions:  Your physician recommends that you continue on your current medications as directed. Please refer to the Current Medication list given to you today.  *If you need a refill on your cardiac medications before your next appointment, please call your pharmacy*  Follow-Up: At [facility], you and your health needs are our priority.  As part of our continuing mission to provide you with exceptional heart care, our providers are all part of one team.  This team includes your primary Cardiologist (physician) and Advanced Practice Providers or APPs (Physician Assistants and Nurse Practitioners) who all work together to provide you with the care you need, when you need it.  Your next appointment:   As planned with General Cardiology

## 2024-05-04 DIAGNOSIS — R55 Syncope and collapse: Secondary | ICD-10-CM | POA: Diagnosis not present

## 2024-05-04 DIAGNOSIS — E538 Deficiency of other specified B group vitamins: Secondary | ICD-10-CM | POA: Diagnosis not present

## 2024-05-05 ENCOUNTER — Ambulatory Visit: Payer: Self-pay

## 2024-05-05 DIAGNOSIS — H9042 Sensorineural hearing loss, unilateral, left ear, with unrestricted hearing on the contralateral side: Secondary | ICD-10-CM | POA: Diagnosis not present

## 2024-05-05 DIAGNOSIS — F332 Major depressive disorder, recurrent severe without psychotic features: Secondary | ICD-10-CM | POA: Diagnosis not present

## 2024-05-05 DIAGNOSIS — J301 Allergic rhinitis due to pollen: Secondary | ICD-10-CM | POA: Diagnosis not present

## 2024-05-05 DIAGNOSIS — H9122 Sudden idiopathic hearing loss, left ear: Secondary | ICD-10-CM | POA: Diagnosis not present

## 2024-05-05 DIAGNOSIS — H6981 Other specified disorders of Eustachian tube, right ear: Secondary | ICD-10-CM | POA: Diagnosis not present

## 2024-05-08 ENCOUNTER — Encounter: Payer: Self-pay | Admitting: Family Medicine

## 2024-05-09 MED ORDER — BIOTENE DRY MOUTH GENTLE MT LIQD
15.0000 mL | Freq: Three times a day (TID) | OROMUCOSAL | 11 refills | Status: DC | PRN
Start: 2024-05-09 — End: 2024-07-06

## 2024-05-10 ENCOUNTER — Ambulatory Visit

## 2024-05-10 ENCOUNTER — Encounter (INDEPENDENT_AMBULATORY_CARE_PROVIDER_SITE_OTHER): Payer: Self-pay

## 2024-05-10 DIAGNOSIS — M5459 Other low back pain: Secondary | ICD-10-CM

## 2024-05-10 DIAGNOSIS — M542 Cervicalgia: Secondary | ICD-10-CM

## 2024-05-10 DIAGNOSIS — M5416 Radiculopathy, lumbar region: Secondary | ICD-10-CM

## 2024-05-10 DIAGNOSIS — R262 Difficulty in walking, not elsewhere classified: Secondary | ICD-10-CM | POA: Diagnosis not present

## 2024-05-10 DIAGNOSIS — M5412 Radiculopathy, cervical region: Secondary | ICD-10-CM

## 2024-05-10 DIAGNOSIS — G8929 Other chronic pain: Secondary | ICD-10-CM | POA: Diagnosis not present

## 2024-05-10 DIAGNOSIS — R42 Dizziness and giddiness: Secondary | ICD-10-CM | POA: Diagnosis not present

## 2024-05-10 DIAGNOSIS — R55 Syncope and collapse: Secondary | ICD-10-CM | POA: Diagnosis not present

## 2024-05-10 DIAGNOSIS — M545 Low back pain, unspecified: Secondary | ICD-10-CM | POA: Diagnosis not present

## 2024-05-10 NOTE — Therapy (Signed)
 OUTPATIENT PHYSICAL THERAPY Treatment   Patient Name: Jonathon Snow MRN: [id number] DOB:[birth year], 63 y.o., male Today's Date: 05/10/2024  END OF SESSION:  PT End of Session - 05/10/24 1036     Visit Number 2    Number of Visits 17    Date for PT Re-Evaluation 07/01/24    PT Start Time 1036    PT Stop Time 1116    PT Time Calculation (min) 40 min    Activity Tolerance Patient tolerated treatment well;Patient limited by pain    Behavior During Therapy [facility] for tasks assessed/performed              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   Overview:  superior sagittal sinus, left transverse sinus and cortical veins    Closed left hip fracture, initial encounter (HCC) 02/18/2023   COVID-19 virus infection 12/2020   Depression    DVT (deep venous thrombosis) (HCC)    Dyspnea    GERD (gastroesophageal reflux disease)    Head injury 11/05/2023   Heart attack (HCC) 11/05/2023   Heel spur, left 02/16/2019   Heel spur, right 02/16/2019   Hematoma of groin 11/08/2023   Herpes zoster infection of lumbar region 02/20/2020   History of DVT (deep vein thrombosis) 03/22/2020   History of kidney stones    Hyperlipidemia    Hypertension    Long term current use of systemic steroids 11/08/2020   Lupus    Lymphedema 10/07/2018   Morbid obesity (HCC)    Opiate abuse, episodic (HCC) 02/26/2018   Osteoporosis    Pain and swelling of right lower extremity 02/03/2023   Pneumonia    PONV (postoperative nausea and vomiting)    Postphlebitic syndrome with ulcer, 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   Presence of IVC filter 03/22/2020   Removed   Pulmonary embolism (HCC)    Renal disorder    Stage III   Severe episode of recurrent major depressive disorder, without  psychotic features (HCC) 07/07/2023   Shock circulatory (HCC) 11/06/2023   STEMI (ST elevation myocardial infarction) (HCC) 11/05/2023   Past Surgical History:  Procedure Laterality Date   ANKLE SURGERY Right    BACK SURGERY     BRONCHIAL WASHINGS N/A 11/01/2021   Procedure: BRONCHIAL WASHINGS;  Surgeon: Erskin Hearing, MD;  Location: [facility] ORS;  Service: Thoracic;  Laterality: N/A;   COLONOSCOPY WITH PROPOFOL  N/A 05/28/2020   Procedure: COLONOSCOPY WITH PROPOFOL ;  Surgeon: Luke Salaam, MD;  Location: [facility] ENDOSCOPY;  Service: Endoscopy;  Laterality: N/A;   COLONOSCOPY WITH PROPOFOL  N/A 07/01/2023   Procedure: COLONOSCOPY WITH PROPOFOL ;  Surgeon: Luke Salaam, MD;  Location: [facility] ENDOSCOPY;  Service: Gastroenterology;  Laterality: N/A;   CORONARY ULTRASOUND/IVUS N/A 11/05/2023   Procedure: Coronary Ultrasound/IVUS;  Surgeon: Sammy Crisp, MD;  Location: [facility] INVASIVE CV LAB;  Service: Cardiovascular;  Laterality: N/A;   CORONARY/GRAFT ACUTE MI REVASCULARIZATION N/A 11/05/2023   Procedure: Coronary/Graft Acute MI Revascularization;  Surgeon: Sammy Crisp, MD;  Location: [facility] INVASIVE CV LAB;  Service: Cardiovascular;  Laterality: N/A;   CYST EXCISION  92 or 93    Liver cyst removal [facility]   FLEXIBLE BRONCHOSCOPY N/A 11/01/2021   Procedure: FLEXIBLE BRONCHOSCOPY;  Surgeon: Erskin Hearing, MD;  Location: [facility] ORS;  Service: Thoracic;  Laterality: N/A;   HIP PINNING,CANNULATED Left  02/19/2023   Procedure: PERCUTANEOUS FIXATION OF FEMORAL NECK;  Surgeon: Lorri Rota, MD;  Location: [facility] ORS;  Service: Orthopedics;  Laterality: Left;   I & D EXTREMITY Right 04/29/2017   Procedure: IRRIGATION AND DEBRIDEMENT EXTREMITY;  Surgeon: Gwyndolyn Lerner, MD;  Location: [facility] ORS;  Service: General;  Laterality: Right;   IRRIGATION AND DEBRIDEMENT ABSCESS Left 04/29/2017   Procedure: IRRIGATION AND DEBRIDEMENT Scrotal ABSCESS;  Surgeon: Gwyndolyn Lerner, MD;  Location: [facility] ORS;  Service: General;   Laterality: Left;   LEFT HEART CATH AND CORONARY ANGIOGRAPHY N/A 11/05/2023   Procedure: LEFT HEART CATH AND CORONARY ANGIOGRAPHY;  Surgeon: Sammy Crisp, MD;  Location: [facility] INVASIVE CV LAB;  Service: Cardiovascular;  Laterality: N/A;   POLYPECTOMY  07/01/2023   Procedure: POLYPECTOMY;  Surgeon: Luke Salaam, MD;  Location: [facility] ENDOSCOPY;  Service: Gastroenterology;;   Patient Active Problem List   Diagnosis Date Noted   Anxiety and depression 02/26/2024   Dyslipidemia 02/26/2024   Peripheral neuropathy 02/26/2024   Essential hypertension 02/26/2024   Vertigo 02/26/2024   Syncope 02/25/2024   Orthostatic hypotension 02/11/2024   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) 01/20/2024   Anxiety 12/14/2023   Overweight (BMI 25.0-29.9) 11/23/2023   Chronic heart failure with preserved ejection fraction (HFpEF) (HCC) 11/23/2023   BPH (benign prostatic hyperplasia) 11/23/2023   Unstable angina (HCC) 11/10/2023   Closed T10 spinal fracture (HCC) 11/06/2023   Closed tricolumnar fracture of thoracic vertebra (HCC) 11/06/2023   Thoracic spine instability 11/06/2023   Chronic bilateral low back pain with left-sided sciatica 07/07/2023   Neuropathy 07/07/2023   Hx of colonic polyps 07/01/2023   Adenomatous polyp of colon 07/01/2023   Erectile disorder 01/22/2023   Coronary artery disease involving native coronary artery of native heart without angina pectoris 09/09/2022   Varicose 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 01/03/2022   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) 09/18/2021   Prediabetes 11/08/2020   Stage 3a chronic kidney disease (HCC) 11/08/2020   Seasonal allergies 11/08/2020   Rhinosinusitis 11/08/2020   Anticoagulant disorder (HCC) 11/07/2020   Senile purpura (HCC) 11/07/2020   MDD (major depressive disorder), recurrent episode, mild (HCC) 09/12/2019   Other spondylosis with radiculopathy, lumbar region 02/16/2019   Osteoporosis  10/12/2018   Chronic venous insufficiency 10/07/2018   Lymphedema 10/07/2018   SLE glomerulonephritis syndrome, 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HCC) 03/03/2018   Syncope and collapse 12/29/2017   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) 10/29/2016   Hyperlipidemia LDL goal <70 01/15/2016   Uncontrolled hypertension 01/15/2016   Obesity (BMI 30.0-34.9) 01/15/2016   Long term current use of anticoagulant 10/04/2015   Systemic lupus erythematosus (HCC) 05/30/2015   COPD, moderate (HCC) 06/27/2014   Shortness of breath 06/27/2014   History of pulmonary embolism 12/11/2013   Nodule of right lung 12/11/2013   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   Cystic disease of liver 08/21/2013    PCP: Adeline Hone, PA-C   REFERRING PROVIDER: Jodeen Munch, MD  REFERRING DIAG: [phone] (ICD-10-CM) - Cervical radiculopathy M54.50,G89.29 (ICD-10-CM) - Chronic bilateral low back pain without sciatica  Rationale for Evaluation and Treatment: Rehabilitation  THERAPY DIAG:  Cervicalgia  Other low back pain  Radiculopathy, cervical region  Radiculopathy, lumbar region  Difficulty in walking, not elsewhere classified  ONSET DATE: November 2024  SUBJECTIVE:  SUBJECTIVE STATEMENT: Was a little dizzy this morning while getting out of the shower but went away.   Has not really done a lot of moving around today, hurts but not as bad as other days. 6/10 neck pain currently.   Low back: is about the same, 6/10 currently.   Sits down or lays down when he feels like he is going to pass out.      PERTINENT HISTORY:  Cervicalgia, low back pain. Pt also reports R > L C5/C6 dermatome to elbow bilaterally. Pt has a difficult time turning his head and looking up. No neck  surgery. Had Thoracic and lumbar spine fusion surgery. Had mid back surgery (thoracic) November 2024 due to his fall. Neck pain is more recent. Neck and shoulder pain began November 2024 after his thoracic surgery. Usually sleeps on his L side but is currently difficulty to do secondary to B shoulder and neck pain, so pt is not sleeping well. Neck pain is the same since November 2024. Pt tends to have issues getting dizzy and passes out when he is standing and exerting himself  Vision gets blurry and splotchy. Room feels like it is waving around, pt is not walking on a stable surface, feels like he is walking on a boat and not having sea legs.   The IVC filter was removed.  Still has L LE DVT. Pt states taking Elequis. L LE DVT is still being treated.    PAIN:  Are you having pain? Yes: NPRS scale: /10 currently Pain location: neck (cervical paraspinals and upper trap) and B C5/C6 area to elbow Pain description: really really sore Aggravating factors: raising his arms up such as to reach, lifting with his arm, turning his head, looking up.  Relieving factors: Laying flat on his back on his bed with a pillow.    Pt also gets a stinging sensation at mid back around the T7 and above area, not all the time   Are you having pain? Yes: NPRS scale: /10 currently (pt sitting on a chair) Pain location: L low back pain; L L4 dermatome radiating symptoms to big toe.  Pain description: stinging, burning, ache Aggravating factors: sitting for long periods, about 15-20 minutes, trying to move around, bending lifting,   Relieving factors: Laying flat on his back, on the bed  PRECAUTIONS: dizzy and passes out when he is standing and exerting himself  L LE DVT, fall risk  RED FLAGS: Bowel or bladder incontinence: No and Cauda equina syndrome: No   WEIGHT BEARING RESTRICTIONS: No  FALLS:  Has patient fallen in last 6 months? Yes. Number of falls 2 (original fall was when pt had a heart attack  November 2024 and hurt his back. February 22, 2024, pt passed out and fell into the bath tub.   LIVING ENVIRONMENT: Lives with: lives alone Lives in: House/apartment Stairs: No Has following equipment at home: Single point cane and Environmental consultant - 2 wheeled  OCCUPATION: Not currently working  PLOF: Needs assistance with homemaking   PATIENT GOALS: be able to do everything better.   NEXT MD VISIT: Yes but does not know when.   OBJECTIVE:  Note: Objective measures were completed at Evaluation unless otherwise noted.  DIAGNOSTIC FINDINGS:  MR CERVICAL SPINE WO CONTRAST 02/26/2024  Narrative & Impression  CLINICAL DATA:  Myelopathy, acute, cervical spine   EXAM: MRI CERVICAL SPINE WITHOUT CONTRAST   TECHNIQUE: Multiplanar, multisequence MR imaging of the cervical spine was performed. No intravenous contrast was administered.  COMPARISON:  CT of the cervical spine November 05, 2023.   FINDINGS: Alignment: No substantial sagittal subluxation.   Vertebrae: No evidence of acute fracture, evidence of discitis, or suspicious bone lesion. Benign vertebral venous malformation at T3. Mild T1-T2 height loss, unchanged.   Cord: Normal cord signal.  Neck   Posterior Fossa, vertebral arteries, paraspinal tissues: Visualized vertebral artery flow voids are maintained. No paraspinal edema.   Disc levels:   C2-C3: No significant disc protrusion, foraminal stenosis, or canal stenosis.   C3-C4: No significant disc protrusion, foraminal stenosis, or canal stenosis.   C4-C5: Right eccentric posterior disc osteophyte complex with right greater than left facet and uncovertebral hypertrophy. Resulting mild right greater than left foraminal stenosis. Mild canal stenosis.   C5-C6: Posterior disc osteophyte complex ligamentum flavum thickening and right greater than left facet and uncovertebral hypertrophy. Resulting mild-to-moderate bilateral foraminal stenosis. Mild to moderate canal  stenosis.   C6-C7: Posterior disc osteophyte complex with bilateral facet and uncovertebral hypertrophy. Resulting moderate bilateral foraminal stenosis. Mild canal stenosis.   C7-T1: No significant disc protrusion, foraminal stenosis, or canal stenosis.   IMPRESSION: 1. 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 2. At C5-C6, mild to moderate canal and bilateral foraminal stenosis. 3. At C4-C5, mild canal and bilateral foraminal stenosis.     Electronically Signed   By: Stevenson Elbe M.D.   On: 02/27/2024 02:42         PATIENT SURVEYS:  Neck Disability Index score: 41/50 (82%) (05/02/2024)  COGNITION: Overall cognitive status: Within functional limits for tasks assessed     SENSATION:   MUSCLE LENGTH:   POSTURE:   PALPATION: TTP to posterior neck   CERVICAL ROM:   Active ROM A/PROM (deg) eval  Flexion WFL with posterior neck pain  Extension Very limited with pain, movement preference around C5/C6 area  Right lateral flexion Very limited with pain  Left lateral flexion Very limited with pain  Right rotation 20 degrees with pain  Left rotation 22 degrees with pain   (Blank rows = not tested)  UPPER EXTREMITY MMT:  MMT Right eval Left eval  Shoulder flexion 3+ with neck and shoulder and arm pain 4- with pain at available range.   Shoulder extension    Shoulder abduction    Shoulder adduction    Shoulder extension    Shoulder internal rotation    Shoulder external rotation    Middle trapezius    Lower trapezius (seated manually resisted) 3+ with neck pain 3+ with neck pain  Elbow flexion    Elbow extension    Wrist flexion    Wrist extension    Wrist ulnar deviation    Wrist radial deviation    Wrist pronation    Wrist supination    Grip strength     (Blank rows = not tested)  UPPER EXTREMITY ROM:  Active ROM Right eval Left eval  Shoulder flexion 55 (scaption) 55 (scaption)  Shoulder extension     Shoulder abduction    Shoulder adduction    Shoulder extension    Shoulder internal rotation    Shoulder external rotation    Elbow flexion    Elbow extension    Wrist flexion    Wrist extension    Wrist ulnar deviation    Wrist radial deviation    Wrist pronation    Wrist supination     (Blank rows = not tested)    LUMBAR ROM:   AROM eval  Flexion  Extension   Right lateral flexion   Left lateral flexion   Right rotation   Left rotation    (Blank rows = not tested)  LOWER EXTREMITY ROM:     Passive  Right eval Left eval  Hip flexion    Hip extension    Hip abduction    Hip adduction    Hip internal rotation    Hip external rotation    Knee flexion    Knee extension    Ankle dorsiflexion    Ankle plantarflexion    Ankle inversion    Ankle eversion     (Blank rows = not tested)  LOWER EXTREMITY MMT:    MMT Right eval Left eval  Hip flexion    Hip extension    Hip abduction    Hip adduction    Hip internal rotation    Hip external rotation    Knee flexion    Knee extension    Ankle dorsiflexion    Ankle plantarflexion    Ankle inversion    Ankle eversion     (Blank rows = not tested)  LUMBAR SPECIAL TESTS:    FUNCTIONAL TESTS:    GAIT: Distance walked: 30 ft Assistive device utilized: None Level of assistance: SBA Comments: antalgic, decreased stance R LE, forward flexed, decreased gait speed   TREATMENT DATE: 05/10/2024                                                                                                                               Therapeutic exercise  At start of session: Blood pressure L arm sitting, mechanically taken, normal cuff: 128/75, HR 57 SpO2 room air: 100%  Seated B scapular retraction. 10x. Uncomfortable. Takes time for symptoms to return to baseline levels  Seated pressing tongue 5x5 seconds for 4 sets  Neck symptoms. Eases quickly with rest.   Seated gentle chin tuck 4x  Increased neck pain,  lingers  Seated gentle cervical flexion at comfortable range 5x3    Improved exercise technique, movement at target joints, use of target muscles after mod verbal, visual, tactile cues.   Manual therapy  Performed to decrease muscle tension and for desensitization  Seated STM B cervical paraspinal muscles, TTP, not comfortable, Seated STM R upper trap and distal scalene area to decrease tension  Discomfort lingers before it returns to baseline.   Seated STM L upper trap muscle     PATIENT EDUCATION:  Education details: POC Person educated: Patient Education method: Explanation Education comprehension: verbalized understanding  HOME EXERCISE PROGRAM: Pt was recommended to press his tongue at the roof of his mouth to activate his anterior cervical muscles throughout the day. Pt demonstrated and verbalized understanding.    Access Code: [email or web address] URL: [email or web address] Date: 05/10/2024 Prepared by: Suzzane Estes  Exercises - Seated Cervical Flexion AROM  - 2 x daily - 7 x weekly - 3 sets - 5 reps  ASSESSMENT:  CLINICAL IMPRESSION:  Worked  on gentle anterior cervical muscle strengthening, and decreasing upper trap muscle tension to his neck. Also utilized STM to help with desensitization. Pt also demonstrates irritability of symptoms. Fait tolerance to session, pt currently limited by pain and medical history. Pt will benefit from continued skilled physical therapy services to decrease pain, improve strength and function.     OBJECTIVE IMPAIRMENTS: Abnormal gait, decreased balance, decreased endurance, difficulty walking, decreased ROM, decreased strength, dizziness, impaired UE functional use, improper body mechanics, postural dysfunction, and pain.   ACTIVITY LIMITATIONS: carrying, lifting, bending, sitting, standing, squatting, sleeping, stairs, transfers, bed mobility, bathing, toileting, dressing, reach over head, hygiene/grooming, locomotion level,  and caring for others  PARTICIPATION LIMITATIONS:   PERSONAL FACTORS: Age, Fitness, Past/current experiences, Time since onset of injury/illness/exacerbation, and 3+ comorbidities: DVT, depression, dyspnea, heart attack, HTN, osteoporosis, are also affecting patient's functional outcome.   REHAB POTENTIAL: Fair    CLINICAL DECISION MAKING: Stable/uncomplicated  EVALUATION COMPLEXITY: Low   GOALS: Goals reviewed with patient? Yes  SHORT TERM GOALS: Target date: 05/13/2024  Pt will be independent with his initial HEP to improve strength, decrease pain, improve ability to lift, look around as well as ambulate and perform standing tasks with less difficulty and pain. Baseline: Pt has not yet officially started his initial HEP (05/02/2024) Goal status: INITIAL    LONG TERM GOALS: Target date: 07/01/2024  Pt will have a decrease in neck pain to 5/10 or less at worst to promote ability to look around more comfortably.  Baseline: 9/10 neck pain at worst for the past 3 months (05/02/2024) Goal status: INITIAL  2.  Pt will have a decrease on low back pain to 4/10 or less at worst to promote ability to tolerate sitting, standing, walking and performing standing tasks more comfortably for his back.  Baseline: 8/10 low back pain at worst for  Goal status: INITIAL  3.  Pt will improve his Neck Disability Index (NDI) score by at least 20 % as a demonstration of improved function.  Baseline: Neck Disability Index score: 41/50 (82%) (05/02/2024) Goal status: INITIAL  4.  Pt will improve his cervical rotation AROM to 50 degrees or more to promote ability to look around more comfortably. Baseline:  Active ROM A/PROM (deg) eval  Right rotation 20 degrees with pain  Left rotation 22 degrees with pain   (05/02/2024)  Goal status: INITIAL  5.  Pt will improve B shoulder flexion AROM to 100 degrees or more to promote ability to reach as well as lift more comfortably.  Baseline:  Active ROM  Right eval Left eval  Shoulder flexion 55 (scaption) 55 (scaption)   (05/02/2024)  Goal status: INITIAL  6.  Pt will improve his shoulder flexion and lower trap strength by at least 1/2 MMT grade to promote ability to raise his arms as well as lift with less difficulty.  Baseline:  MMT Right eval Left eval  Shoulder flexion 3+ with neck and shoulder and arm pain 4- with pain at available range.   Lower trapezius (seated manually resisted) 3+ with neck pain 3+ with neck pain   (05/02/2024)   Goal status: INITIAL   PLAN:  PT FREQUENCY: 1-2x/week  PT DURATION: 8 weeks  PLANNED INTERVENTIONS: 97110-Therapeutic exercises, 97530- Therapeutic activity, 97112- Neuromuscular re-education, 97535- Self Care, 13086- Manual therapy, [phone]- Gait training, [phone]- Electrical stimulation (unattended), [phone]- Ionotophoresis 4mg /ml Dexamethasone , Patient/Family education, and Balance training.  PLAN FOR NEXT SESSION: posture, B scapular, anterior cervical, trunk and glute strengthening, mechanics, manual techniques,  modalities PRN   Norissa Bartee, PT, DPT 05/10/2024, 1:06 PM

## 2024-05-11 DIAGNOSIS — E538 Deficiency of other specified B group vitamins: Secondary | ICD-10-CM | POA: Diagnosis not present

## 2024-05-12 ENCOUNTER — Ambulatory Visit

## 2024-05-12 ENCOUNTER — Other Ambulatory Visit: Payer: Self-pay | Admitting: Family Medicine

## 2024-05-12 DIAGNOSIS — I10 Essential (primary) hypertension: Secondary | ICD-10-CM

## 2024-05-12 DIAGNOSIS — M5416 Radiculopathy, lumbar region: Secondary | ICD-10-CM | POA: Diagnosis not present

## 2024-05-12 DIAGNOSIS — R55 Syncope and collapse: Secondary | ICD-10-CM | POA: Diagnosis not present

## 2024-05-12 DIAGNOSIS — M5459 Other low back pain: Secondary | ICD-10-CM

## 2024-05-12 DIAGNOSIS — M5412 Radiculopathy, cervical region: Secondary | ICD-10-CM

## 2024-05-12 DIAGNOSIS — R42 Dizziness and giddiness: Secondary | ICD-10-CM | POA: Diagnosis not present

## 2024-05-12 DIAGNOSIS — F332 Major depressive disorder, recurrent severe without psychotic features: Secondary | ICD-10-CM | POA: Diagnosis not present

## 2024-05-12 DIAGNOSIS — R262 Difficulty in walking, not elsewhere classified: Secondary | ICD-10-CM | POA: Diagnosis not present

## 2024-05-12 DIAGNOSIS — G8929 Other chronic pain: Secondary | ICD-10-CM | POA: Diagnosis not present

## 2024-05-12 DIAGNOSIS — I2510 Atherosclerotic heart disease of native coronary artery without angina pectoris: Secondary | ICD-10-CM

## 2024-05-12 DIAGNOSIS — M545 Low back pain, unspecified: Secondary | ICD-10-CM | POA: Diagnosis not present

## 2024-05-12 DIAGNOSIS — M542 Cervicalgia: Secondary | ICD-10-CM | POA: Diagnosis not present

## 2024-05-12 NOTE — Therapy (Signed)
 OUTPATIENT PHYSICAL THERAPY Treatment   Patient Name: Jonathon Snow MRN: [id number] DOB:01-06-1961, 63 y.o., male Today's Date: 05/12/2024  END OF SESSION:  PT End of Session - 05/12/24 1520     Visit Number 3    Number of Visits 17    Date for PT Re-Evaluation 07/01/24    PT Start Time 1521    PT Stop Time 1600    PT Time Calculation (min) 39 min    Activity Tolerance Patient tolerated treatment well;Patient limited by pain    Behavior During Therapy [facility] for tasks assessed/performed               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   Overview:  superior sagittal sinus, left transverse sinus and cortical veins    Closed left hip fracture, initial encounter (HCC) 02/18/2023   COVID-19 virus infection 12/2020   Depression    DVT (deep venous thrombosis) (HCC)    Dyspnea    GERD (gastroesophageal reflux disease)    Head injury 11/05/2023   Heart attack (HCC) 11/05/2023   Heel spur, left 02/16/2019   Heel spur, right 02/16/2019   Hematoma of groin 11/08/2023   Herpes zoster infection of lumbar region 02/20/2020   History of DVT (deep vein thrombosis) 03/22/2020   History of kidney stones    Hyperlipidemia    Hypertension    Long term current use of systemic steroids 11/08/2020   Lupus    Lymphedema 10/07/2018   Morbid obesity (HCC)    Opiate abuse, episodic (HCC) 02/26/2018   Osteoporosis    Pain and swelling of right lower extremity 02/03/2023   Pneumonia    PONV (postoperative nausea and vomiting)    Postphlebitic syndrome with ulcer, 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   Presence of IVC filter 03/22/2020   Removed   Pulmonary embolism (HCC)    Renal disorder    Stage III   Severe episode of recurrent major depressive disorder, without  psychotic features (HCC) 07/07/2023   Shock circulatory (HCC) 11/06/2023   STEMI (ST elevation myocardial infarction) (HCC) 11/05/2023   Past Surgical History:  Procedure Laterality Date   ANKLE SURGERY Right    BACK SURGERY     BRONCHIAL WASHINGS N/A 11/01/2021   Procedure: BRONCHIAL WASHINGS;  Surgeon: Erskin Hearing, MD;  Location: [facility] ORS;  Service: Thoracic;  Laterality: N/A;   COLONOSCOPY WITH PROPOFOL  N/A 05/28/2020   Procedure: COLONOSCOPY WITH PROPOFOL ;  Surgeon: Luke Salaam, MD;  Location: [facility] ENDOSCOPY;  Service: Endoscopy;  Laterality: N/A;   COLONOSCOPY WITH PROPOFOL  N/A 07/01/2023   Procedure: COLONOSCOPY WITH PROPOFOL ;  Surgeon: Luke Salaam, MD;  Location[facility] ENDOSCOPY;  Service: Gastroenterology;  Laterality: N/A;   CORONARY ULTRASOUND/IVUS N/A 11/05/2023   Procedure: Coronary Ultrasound/IVUS;  Surgeon: Sammy Crisp, MD;  Location: [facility] INVASIVE CV LAB;  Service: Cardiovascular;  Laterality: N/A;   CORONARY/GRAFT ACUTE MI REVASCULARIZATION N/A 11/05/2023   Procedure: Coronary/Graft Acute MI Revascularization;  Surgeon: Sammy Crisp, MD;  Location: [facility] INVASIVE CV LAB;  Service: Cardiovascular;  Laterality: N/A;   CYST EXCISION  92 or 93    Liver cyst removal [facility]   FLEXIBLE BRONCHOSCOPY N/A 11/01/2021   Procedure: FLEXIBLE BRONCHOSCOPY;  Surgeon: Erskin Hearing, MD;  Location: [facility] ORS;  Service: Thoracic;  Laterality: N/A;   HIP PINNING,CANNULATED  Left 02/19/2023   Procedure: PERCUTANEOUS FIXATION OF FEMORAL NECK;  Surgeon: Lorri Rota, MD;  Location: [facility] ORS;  Service: Orthopedics;  Laterality: Left;   I & D EXTREMITY Right 04/29/2017   Procedure: IRRIGATION AND DEBRIDEMENT EXTREMITY;  Surgeon: Gwyndolyn Lerner, MD;  Location: [facility] ORS;  Service: General;  Laterality: Right;   IRRIGATION AND DEBRIDEMENT ABSCESS Left 04/29/2017   Procedure: IRRIGATION AND DEBRIDEMENT Scrotal ABSCESS;  Surgeon: Gwyndolyn Lerner, MD;  Location: [facility] ORS;  Service: General;   Laterality: Left;   LEFT HEART CATH AND CORONARY ANGIOGRAPHY N/A 11/05/2023   Procedure: LEFT HEART CATH AND CORONARY ANGIOGRAPHY;  Surgeon: Sammy Crisp, MD;  Location: [facility] INVASIVE CV LAB;  Service: Cardiovascular;  Laterality: N/A;   POLYPECTOMY  07/01/2023   Procedure: POLYPECTOMY;  Surgeon: Luke Salaam, MD;  Location: [facility] ENDOSCOPY;  Service: Gastroenterology;;   Patient Active Problem List   Diagnosis Date Noted   Anxiety and depression 02/26/2024   Dyslipidemia 02/26/2024   Peripheral neuropathy 02/26/2024   Essential hypertension 02/26/2024   Vertigo 02/26/2024   Syncope 02/25/2024   Orthostatic hypotension 02/11/2024   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) 01/20/2024   Anxiety 12/14/2023   Overweight (BMI 25.0-29.9) 11/23/2023   Chronic heart failure with preserved ejection fraction (HFpEF) (HCC) 11/23/2023   BPH (benign prostatic hyperplasia) 11/23/2023   Unstable angina (HCC) 11/10/2023   Closed T10 spinal fracture (HCC) 11/06/2023   Closed tricolumnar fracture of thoracic vertebra (HCC) 11/06/2023   Thoracic spine instability 11/06/2023   Chronic bilateral low back pain with left-sided sciatica 07/07/2023   Neuropathy 07/07/2023   Hx of colonic polyps 07/01/2023   Adenomatous polyp of colon 07/01/2023   Erectile disorder 01/22/2023   Coronary artery disease involving native coronary artery of native heart without angina pectoris 09/09/2022   Varicose 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 01/03/2022   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) 09/18/2021   Prediabetes 11/08/2020   Stage 3a chronic kidney disease (HCC) 11/08/2020   Seasonal allergies 11/08/2020   Rhinosinusitis 11/08/2020   Anticoagulant disorder (HCC) 11/07/2020   Senile purpura (HCC) 11/07/2020   MDD (major depressive disorder), recurrent episode, mild (HCC) 09/12/2019   Other spondylosis with radiculopathy, lumbar region 02/16/2019   Osteoporosis  10/12/2018   Chronic venous insufficiency 10/07/2018   Lymphedema 10/07/2018   SLE glomerulonephritis syndrome, 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HCC) 03/03/2018   Syncope and collapse 12/29/2017   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) 10/29/2016   Hyperlipidemia LDL goal <70 01/15/2016   Uncontrolled hypertension 01/15/2016   Obesity (BMI 30.0-34.9) 01/15/2016   Long term current use of anticoagulant 10/04/2015   Systemic lupus erythematosus (HCC) 05/30/2015   COPD, moderate (HCC) 06/27/2014   Shortness of breath 06/27/2014   History of pulmonary embolism 12/11/2013   Nodule of right lung 12/11/2013   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   Cystic disease of liver 08/21/2013    PCP: Adeline Hone, PA-C   REFERRING PROVIDER: Jodeen Munch, MD  REFERRING DIAG: [phone] (ICD-10-CM) - Cervical radiculopathy M54.50,G89.29 (ICD-10-CM) - Chronic bilateral low back pain without sciatica  Rationale for Evaluation and Treatment: Rehabilitation  THERAPY DIAG:  Cervicalgia  Other low back pain  Radiculopathy, cervical region  Radiculopathy, lumbar region  ONSET DATE: November 2024  SUBJECTIVE:  SUBJECTIVE STATEMENT: Feeling a little rough today, everything is hurting more than normal. No light headedness or dizziness.     PERTINENT HISTORY:  Cervicalgia, low back pain. Pt also reports R > L C5/C6 dermatome to elbow bilaterally. Pt has a difficult time turning his head and looking up. No neck surgery. Had Thoracic and lumbar spine fusion surgery. Had mid back surgery (thoracic) November 2024 due to his fall. Neck pain is more recent. Neck and shoulder pain began November 2024 after his thoracic surgery. Usually sleeps on his L side but is currently  difficulty to do secondary to B shoulder and neck pain, so pt is not sleeping well. Neck pain is the same since November 2024. Pt tends to have issues getting dizzy and passes out when he is standing and exerting himself  Vision gets blurry and splotchy. Room feels like it is waving around, pt is not walking on a stable surface, feels like he is walking on a boat and not having sea legs.   The IVC filter was removed.  Still has L LE DVT. Pt states taking Elequis. L LE DVT is still being treated.    PAIN:  Are you having pain? Yes: NPRS scale: /10 currently Pain location: neck (cervical paraspinals and upper trap) and B C5/C6 area to elbow Pain description: really really sore Aggravating factors: raising his arms up such as to reach, lifting with his arm, turning his head, looking up.  Relieving factors: Laying flat on his back on his bed with a pillow.    Pt also gets a stinging sensation at mid back around the T7 and above area, not all the time   Are you having pain? Yes: NPRS scale: /10 currently (pt sitting on a chair) Pain location: L low back pain; L L4 dermatome radiating symptoms to big toe.  Pain description: stinging, burning, ache Aggravating factors: sitting for long periods, about 15-20 minutes, trying to move around, bending lifting,   Relieving factors: Laying flat on his back, on the bed  PRECAUTIONS: dizzy and passes out when he is standing and exerting himself  L LE DVT, fall risk  RED FLAGS: Bowel or bladder incontinence: No and Cauda equina syndrome: No   WEIGHT BEARING RESTRICTIONS: No  FALLS:  Has patient fallen in last 6 months? Yes. Number of falls 2 (original fall was when pt had a heart attack November 2024 and hurt his back. February 22, 2024, pt passed out and fell into the bath tub.   LIVING ENVIRONMENT: Lives with: lives alone Lives in: House/apartment Stairs: No Has following equipment at home: Single point cane and Environmental consultant - 2 wheeled  OCCUPATION:  Not currently working  PLOF: Needs assistance with homemaking   PATIENT GOALS: be able to do everything better.   NEXT MD VISIT: Yes but does not know when.   OBJECTIVE:  Note: Objective measures were completed at Evaluation unless otherwise noted.  DIAGNOSTIC FINDINGS:  MR CERVICAL SPINE WO CONTRAST 02/26/2024  Narrative & Impression  CLINICAL DATA:  Myelopathy, acute, cervical spine   EXAM: MRI CERVICAL SPINE WITHOUT CONTRAST   TECHNIQUE: Multiplanar, multisequence MR imaging of the cervical spine was performed. No intravenous contrast was administered.   COMPARISON:  CT of the cervical spine November 05, 2023.   FINDINGS: Alignment: No substantial sagittal subluxation.   Vertebrae: No evidence of acute fracture, evidence of discitis, or suspicious bone lesion. Benign vertebral venous malformation at T3. Mild T1-T2 height loss, unchanged.   Cord: Normal cord  signal.  Neck   Posterior Fossa, vertebral arteries, paraspinal tissues: Visualized vertebral artery flow voids are maintained. No paraspinal edema.   Disc levels:   C2-C3: No significant disc protrusion, foraminal stenosis, or canal stenosis.   C3-C4: No significant disc protrusion, foraminal stenosis, or canal stenosis.   C4-C5: Right eccentric posterior disc osteophyte complex with right greater than left facet and uncovertebral hypertrophy. Resulting mild right greater than left foraminal stenosis. Mild canal stenosis.   C5-C6: Posterior disc osteophyte complex ligamentum flavum thickening and right greater than left facet and uncovertebral hypertrophy. Resulting mild-to-moderate bilateral foraminal stenosis. Mild to moderate canal stenosis.   C6-C7: Posterior disc osteophyte complex with bilateral facet and uncovertebral hypertrophy. Resulting moderate bilateral foraminal stenosis. Mild canal stenosis.   C7-T1: No significant disc protrusion, foraminal stenosis, or canal stenosis.    IMPRESSION: 1. 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 2. At C5-C6, mild to moderate canal and bilateral foraminal stenosis. 3. At C4-C5, mild canal and bilateral foraminal stenosis.     Electronically Signed   By: Stevenson Elbe M.D.   On: 02/27/2024 02:42         PATIENT SURVEYS:  Neck Disability Index score: 41/50 (82%) (05/02/2024)  COGNITION: Overall cognitive status: Within functional limits for tasks assessed     SENSATION:   MUSCLE LENGTH:   POSTURE:   PALPATION: TTP to posterior neck   CERVICAL ROM:   Active ROM A/PROM (deg) eval  Flexion WFL with posterior neck pain  Extension Very limited with pain, movement preference around C5/C6 area  Right lateral flexion Very limited with pain  Left lateral flexion Very limited with pain  Right rotation 20 degrees with pain  Left rotation 22 degrees with pain   (Blank rows = not tested)  UPPER EXTREMITY MMT:  MMT Right eval Left eval  Shoulder flexion 3+ with neck and shoulder and arm pain 4- with pain at available range.   Shoulder extension    Shoulder abduction    Shoulder adduction    Shoulder extension    Shoulder internal rotation    Shoulder external rotation    Middle trapezius    Lower trapezius (seated manually resisted) 3+ with neck pain 3+ with neck pain  Elbow flexion    Elbow extension    Wrist flexion    Wrist extension    Wrist ulnar deviation    Wrist radial deviation    Wrist pronation    Wrist supination    Grip strength     (Blank rows = not tested)  UPPER EXTREMITY ROM:  Active ROM Right eval Left eval  Shoulder flexion 55 (scaption) 55 (scaption)  Shoulder extension    Shoulder abduction    Shoulder adduction    Shoulder extension    Shoulder internal rotation    Shoulder external rotation    Elbow flexion    Elbow extension    Wrist flexion    Wrist extension    Wrist ulnar deviation    Wrist radial deviation    Wrist  pronation    Wrist supination     (Blank rows = not tested)    LUMBAR ROM:   AROM eval  Flexion   Extension   Right lateral flexion   Left lateral flexion   Right rotation   Left rotation    (Blank rows = not tested)  LOWER EXTREMITY ROM:     Passive  Right eval Left eval  Hip flexion  Hip extension    Hip abduction    Hip adduction    Hip internal rotation    Hip external rotation    Knee flexion    Knee extension    Ankle dorsiflexion    Ankle plantarflexion    Ankle inversion    Ankle eversion     (Blank rows = not tested)  LOWER EXTREMITY MMT:    MMT Right eval Left eval  Hip flexion    Hip extension    Hip abduction    Hip adduction    Hip internal rotation    Hip external rotation    Knee flexion    Knee extension    Ankle dorsiflexion    Ankle plantarflexion    Ankle inversion    Ankle eversion     (Blank rows = not tested)  LUMBAR SPECIAL TESTS:    FUNCTIONAL TESTS:    GAIT: Distance walked: 30 ft Assistive device utilized: None Level of assistance: SBA Comments: antalgic, decreased stance R LE, forward flexed, decreased gait speed   TREATMENT DATE: 05/12/2024                                                                                                                               Therapeutic exercise  At start of session: Blood pressure L arm sitting, mechanically taken, normal cuff: 162/97, HR 56 SpO2 room air: 100%  Reclined   Cervical nod 2x. Increased neck pain, slowly eases with rest   Pressing tongue at roof of mouth 10% (comfortable) level of effort 10x5 seconds     Transversus abdominis 10% effort (comfortable) 10x5 seconds for 3 sets    Slight decrease in L LE pain.    Elbow flexion to promote neural flossing, pain free level of effort    R 10x3   L 10x3   Supination/pronation to promote neural flossing, pain free level of effort.    R 10x   L 10x     Improved exercise technique, movement at target  joints, use of target muscles after mod verbal, visual, tactile cues.   Manual therapy 1 minute   Reclined STM to cervical paraspinal muscles  Pt unable to tolerate.         PATIENT EDUCATION:  Education details: POC Person educated: Patient Education method: Explanation Education comprehension: verbalized understanding  HOME EXERCISE PROGRAM: Pt was recommended to press his tongue at the roof of his mouth to activate his anterior cervical muscles throughout the day. Pt demonstrated and verbalized understanding.    Access Code: [email or web address] URL: [email or web address] Date: 05/10/2024 Prepared by: Suzzane Estes  Exercises - Seated Cervical Flexion AROM  - 2 x daily - 7 x weekly - 3 sets - 5 reps - Seated Transversus Abdominis Bracing  - 1 x daily - 7 x weekly - 2 -3 sets - 10 reps - 5 seconds hold   ASSESSMENT:  CLINICAL IMPRESSION: Irritable symptoms. Worked on gentle anterior cervical  muscle and transversus abdominis muscle activation to promote core muscle use. Also worked on distal UE movement to promote neural flossing to promote nerve mobility. Fair tolerance to today's session. Pt currently limited by pain and medical history. Pt will benefit from continued skilled physical therapy services to decrease pain, improve strength and function.     OBJECTIVE IMPAIRMENTS: Abnormal gait, decreased balance, decreased endurance, difficulty walking, decreased ROM, decreased strength, dizziness, impaired UE functional use, improper body mechanics, postural dysfunction, and pain.   ACTIVITY LIMITATIONS: carrying, lifting, bending, sitting, standing, squatting, sleeping, stairs, transfers, bed mobility, bathing, toileting, dressing, reach over head, hygiene/grooming, locomotion level, and caring for others  PARTICIPATION LIMITATIONS:   PERSONAL FACTORS: Age, Fitness, Past/current experiences, Time since onset of injury/illness/exacerbation, and 3+ comorbidities: DVT,  depression, dyspnea, heart attack, HTN, osteoporosis, are also affecting patient's functional outcome.   REHAB POTENTIAL: Fair    CLINICAL DECISION MAKING: Stable/uncomplicated  EVALUATION COMPLEXITY: Low   GOALS: Goals reviewed with patient? Yes  SHORT TERM GOALS: Target date: 05/13/2024  Pt will be independent with his initial HEP to improve strength, decrease pain, improve ability to lift, look around as well as ambulate and perform standing tasks with less difficulty and pain. Baseline: Pt has not yet officially started his initial HEP (05/02/2024) Goal status: INITIAL    LONG TERM GOALS: Target date: 07/01/2024  Pt will have a decrease in neck pain to 5/10 or less at worst to promote ability to look around more comfortably.  Baseline: 9/10 neck pain at worst for the past 3 months (05/02/2024) Goal status: INITIAL  2.  Pt will have a decrease on low back pain to 4/10 or less at worst to promote ability to tolerate sitting, standing, walking and performing standing tasks more comfortably for his back.  Baseline: 8/10 low back pain at worst for  Goal status: INITIAL  3.  Pt will improve his Neck Disability Index (NDI) score by at least 20 % as a demonstration of improved function.  Baseline: Neck Disability Index score: 41/50 (82%) (05/02/2024) Goal status: INITIAL  4.  Pt will improve his cervical rotation AROM to 50 degrees or more to promote ability to look around more comfortably. Baseline:  Active ROM A/PROM (deg) eval  Right rotation 20 degrees with pain  Left rotation 22 degrees with pain   (05/02/2024)  Goal status: INITIAL  5.  Pt will improve B shoulder flexion AROM to 100 degrees or more to promote ability to reach as well as lift more comfortably.  Baseline:  Active ROM Right eval Left eval  Shoulder flexion 55 (scaption) 55 (scaption)   (05/02/2024)  Goal status: INITIAL  6.  Pt will improve his shoulder flexion and lower trap strength by at least 1/2  MMT grade to promote ability to raise his arms as well as lift with less difficulty.  Baseline:  MMT Right eval Left eval  Shoulder flexion 3+ with neck and shoulder and arm pain 4- with pain at available range.   Lower trapezius (seated manually resisted) 3+ with neck pain 3+ with neck pain   (05/02/2024)   Goal status: INITIAL   PLAN:  PT FREQUENCY: 1-2x/week  PT DURATION: 8 weeks  PLANNED INTERVENTIONS: 97110-Therapeutic exercises, 97530- Therapeutic activity, [id number]- Neuromuscular re-education, 97535- Self Care, [id number]- Manual therapy, [id number]- Gait training, [phone]- Electrical stimulation (unattended), [phone]- Ionotophoresis 4mg /ml Dexamethasone , Patient/Family education, and Balance training.  PLAN FOR NEXT SESSION: posture, B scapular, anterior cervical, trunk and glute strengthening, mechanics, manual techniques, modalities PRN  Delphia Kaylor, PT, DPT 05/12/2024, 4:12 PM

## 2024-05-13 ENCOUNTER — Encounter: Payer: Self-pay | Admitting: Internal Medicine

## 2024-05-13 DIAGNOSIS — K219 Gastro-esophageal reflux disease without esophagitis: Secondary | ICD-10-CM

## 2024-05-13 NOTE — Telephone Encounter (Signed)
 Requested Prescriptions  Pending Prescriptions Disp Refills   clopidogrel  (PLAVIX ) 75 MG tablet [Pharmacy Med Name: CLOPIDOGREL  75MG  TABLETS] 90 tablet 0    Sig: TAKE 1 TABLET(75 MG) BY MOUTH DAILY     Hematology: Antiplatelets - clopidogrel  Failed - 05/13/2024 10:49 AM      Failed - HCT in normal range and within 180 days    HCT  Date Value Ref Range Status  02/27/2024 34.9 (L) 39.0 - 52.0 % Final   Hematocrit  Date Value Ref Range Status  12/21/2023 38.1 37.5 - 51.0 % Final         Failed - HGB in normal range and within 180 days    Hemoglobin  Date Value Ref Range Status  02/27/2024 11.3 (L) 13.0 - 17.0 g/dL Final  16/09/9603 54.0 (L) 13.0 - 17.0 g/dL Final  98/10/9146 82.9 (L) 13.0 - 17.7 g/dL Final         Failed - Cr in normal range and within 360 days    Creat  Date Value Ref Range Status  12/14/2023 1.38 (H) 0.70 - 1.35 mg/dL Final   Creatinine, Ser  Date Value Ref Range Status  02/27/2024 1.57 (H) 0.61 - 1.24 mg/dL Final   Creatinine, Urine  Date Value Ref Range Status  11/08/2023 153 mg/dL Final         Passed - PLT in normal range and within 180 days    Platelets  Date Value Ref Range Status  02/27/2024 163 150 - 400 K/uL Final  12/21/2023 198 150 - 450 x10E3/uL Final   Platelet Count  Date Value Ref Range Status  02/16/2024 181 150 - 400 K/uL Final         Passed - Valid encounter within last 6 months    Recent Outpatient Visits           1 month ago Otalgia, left   [facility] Adeline Hone, PA-C   1 month ago Non-recurrent acute suppurative otitis media of left ear without spontaneous rupture of tympanic membrane   [facility] Quinton Buckler, FNP   2 months ago Uncontrolled hypertension   [facility] Adeline Hone, PA-C   3 months ago Hypertension, unspecified type   [facility] Adeline Hone, PA-C   3 months ago Recurrent deep vein  thrombosis (DVT) of left lower extremity [facility])   [place] [facility] Adeline Hone, PA-C       Future Appointments             In 2 weeks Adeline Hone, PA-C [facility]   In 10 months Kathreen Pare, [facility] [place]

## 2024-05-17 ENCOUNTER — Other Ambulatory Visit: Payer: Self-pay | Admitting: *Deleted

## 2024-05-17 ENCOUNTER — Ambulatory Visit

## 2024-05-17 ENCOUNTER — Ambulatory Visit: Payer: Medicaid Other | Admitting: Family Medicine

## 2024-05-17 DIAGNOSIS — M5416 Radiculopathy, lumbar region: Secondary | ICD-10-CM

## 2024-05-17 DIAGNOSIS — M542 Cervicalgia: Secondary | ICD-10-CM

## 2024-05-17 DIAGNOSIS — M545 Low back pain, unspecified: Secondary | ICD-10-CM | POA: Diagnosis not present

## 2024-05-17 DIAGNOSIS — M5459 Other low back pain: Secondary | ICD-10-CM

## 2024-05-17 DIAGNOSIS — R55 Syncope and collapse: Secondary | ICD-10-CM | POA: Diagnosis not present

## 2024-05-17 DIAGNOSIS — M5412 Radiculopathy, cervical region: Secondary | ICD-10-CM | POA: Diagnosis not present

## 2024-05-17 DIAGNOSIS — R42 Dizziness and giddiness: Secondary | ICD-10-CM | POA: Diagnosis not present

## 2024-05-17 DIAGNOSIS — K219 Gastro-esophageal reflux disease without esophagitis: Secondary | ICD-10-CM

## 2024-05-17 DIAGNOSIS — G8929 Other chronic pain: Secondary | ICD-10-CM | POA: Diagnosis not present

## 2024-05-17 DIAGNOSIS — R262 Difficulty in walking, not elsewhere classified: Secondary | ICD-10-CM | POA: Diagnosis not present

## 2024-05-17 MED ORDER — PANTOPRAZOLE SODIUM 20 MG PO TBEC: 20.0000 mg | DELAYED_RELEASE_TABLET | Freq: Two times a day (BID) | ORAL | 3 refills | Status: AC

## 2024-05-17 NOTE — Therapy (Signed)
 OUTPATIENT PHYSICAL THERAPY Treatment   Patient Name: Jonathon Snow MRN: [id number] DOB:[birth year], 63 y.o., male Today's Date: 05/17/2024  END OF SESSION:  PT End of Session - 05/17/24 1436     Visit Number 4    Number of Visits 17    Date for PT Re-Evaluation 07/01/24    PT Start Time 1436    PT Stop Time 1521    PT Time Calculation (min) 45 min    Activity Tolerance Patient tolerated treatment well;Patient limited by pain    Behavior During Therapy [facility] for tasks assessed/performed                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   Overview:  superior sagittal sinus, left transverse sinus and cortical veins    Closed left hip fracture, initial encounter (HCC) 02/18/2023   COVID-19 virus infection 12/2020   Depression    DVT (deep venous thrombosis) (HCC)    Dyspnea    GERD (gastroesophageal reflux disease)    Head injury 11/05/2023   Heart attack (HCC) 11/05/2023   Heel spur, left 02/16/2019   Heel spur, right 02/16/2019   Hematoma of groin 11/08/2023   Herpes zoster infection of lumbar region 02/20/2020   History of DVT (deep vein thrombosis) 03/22/2020   History of kidney stones    Hyperlipidemia    Hypertension    Long term current use of systemic steroids 11/08/2020   Lupus    Lymphedema 10/07/2018   Morbid obesity (HCC)    Opiate abuse, episodic (HCC) 02/26/2018   Osteoporosis    Pain and swelling of right lower extremity 02/03/2023   Pneumonia    PONV (postoperative nausea and vomiting)    Postphlebitic syndrome with ulcer, 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   Presence of IVC filter 03/22/2020   Removed   Pulmonary embolism (HCC)    Renal disorder    Stage III   Severe episode of recurrent major depressive disorder, without  psychotic features (HCC) 07/07/2023   Shock circulatory (HCC) 11/06/2023   STEMI (ST elevation myocardial infarction) (HCC) 11/05/2023   Past Surgical History:  Procedure Laterality Date   ANKLE SURGERY Right    BACK SURGERY     BRONCHIAL WASHINGS N/A 11/01/2021   Procedure: BRONCHIAL WASHINGS;  Surgeon: Erskin Hearing, MD;  Location: [facility] ORS;  Service: Thoracic;  Laterality: N/A;   COLONOSCOPY WITH PROPOFOL  N/A 05/28/2020   Procedure: COLONOSCOPY WITH PROPOFOL ;  Surgeon: Luke Salaam, MD;  Location[facility] ENDOSCOPY;  Service: Endoscopy;  Laterality: N/A;   COLONOSCOPY WITH PROPOFOL  N/A 07/01/2023   Procedure: COLONOSCOPY WITH PROPOFOL ;  Surgeon: Luke Salaam, MD;  Location: [facility] ENDOSCOPY;  Service: Gastroenterology;  Laterality: N/A;   CORONARY ULTRASOUND/IVUS N/A 11/05/2023   Procedure: Coronary Ultrasound/IVUS;  Surgeon: Sammy Crisp, MD;  Location: [facility] INVASIVE CV LAB;  Service: Cardiovascular;  Laterality: N/A;   CORONARY/GRAFT ACUTE MI REVASCULARIZATION N/A 11/05/2023   Procedure: Coronary/Graft Acute MI Revascularization;  Surgeon: Sammy Crisp, MD;  Location: [facility] INVASIVE CV LAB;  Service: Cardiovascular;  Laterality: N/A;   CYST EXCISION  92 or 93    Liver cyst removal [facility]   FLEXIBLE BRONCHOSCOPY N/A 11/01/2021   Procedure: FLEXIBLE BRONCHOSCOPY;  Surgeon: Erskin Hearing, MD;  Location: [facility] ORS;  Service: Thoracic;  Laterality: N/A;   HIP  PINNING,CANNULATED Left 02/19/2023   Procedure: PERCUTANEOUS FIXATION OF FEMORAL NECK;  Surgeon: Lorri Rota, MD;  Location: [facility] ORS;  Service: Orthopedics;  Laterality: Left;   I & D EXTREMITY Right 04/29/2017   Procedure: IRRIGATION AND DEBRIDEMENT EXTREMITY;  Surgeon: Gwyndolyn Lerner, MD;  Location: [facility] ORS;  Service: General;  Laterality: Right;   IRRIGATION AND DEBRIDEMENT ABSCESS Left 04/29/2017   Procedure: IRRIGATION AND DEBRIDEMENT Scrotal ABSCESS;  Surgeon: Gwyndolyn Lerner, MD;  Location: [facility] ORS;  Service: General;   Laterality: Left;   LEFT HEART CATH AND CORONARY ANGIOGRAPHY N/A 11/05/2023   Procedure: LEFT HEART CATH AND CORONARY ANGIOGRAPHY;  Surgeon: Sammy Crisp, MD;  Location: [facility] INVASIVE CV LAB;  Service: Cardiovascular;  Laterality: N/A;   POLYPECTOMY  07/01/2023   Procedure: POLYPECTOMY;  Surgeon: Luke Salaam, MD;  Location: [facility] ENDOSCOPY;  Service: Gastroenterology;;   Patient Active Problem List   Diagnosis Date Noted   Anxiety and depression 02/26/2024   Dyslipidemia 02/26/2024   Peripheral neuropathy 02/26/2024   Essential hypertension 02/26/2024   Vertigo 02/26/2024   Syncope 02/25/2024   Orthostatic hypotension 02/11/2024   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) 01/20/2024   Anxiety 12/14/2023   Overweight (BMI 25.0-29.9) 11/23/2023   Chronic heart failure with preserved ejection fraction (HFpEF) (HCC) 11/23/2023   BPH (benign prostatic hyperplasia) 11/23/2023   Unstable angina (HCC) 11/10/2023   Closed T10 spinal fracture (HCC) 11/06/2023   Closed tricolumnar fracture of thoracic vertebra (HCC) 11/06/2023   Thoracic spine instability 11/06/2023   Chronic bilateral low back pain with left-sided sciatica 07/07/2023   Neuropathy 07/07/2023   Hx of colonic polyps 07/01/2023   Adenomatous polyp of colon 07/01/2023   Erectile disorder 01/22/2023   Coronary artery disease involving native coronary artery of native heart without angina pectoris 09/09/2022   Varicose 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 01/03/2022   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) 09/18/2021   Prediabetes 11/08/2020   Stage 3a chronic kidney disease (HCC) 11/08/2020   Seasonal allergies 11/08/2020   Rhinosinusitis 11/08/2020   Anticoagulant disorder (HCC) 11/07/2020   Senile purpura (HCC) 11/07/2020   MDD (major depressive disorder), recurrent episode, mild (HCC) 09/12/2019   Other spondylosis with radiculopathy, lumbar region 02/16/2019   Osteoporosis  10/12/2018   Chronic venous insufficiency 10/07/2018   Lymphedema 10/07/2018   SLE glomerulonephritis syndrome, 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HCC) 03/03/2018   Syncope and collapse 12/29/2017   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) 10/29/2016   Hyperlipidemia LDL goal <70 01/15/2016   Uncontrolled hypertension 01/15/2016   Obesity (BMI 30.0-34.9) 01/15/2016   Long term current use of anticoagulant 10/04/2015   Systemic lupus erythematosus (HCC) 05/30/2015   COPD, moderate (HCC) 06/27/2014   Shortness of breath 06/27/2014   History of pulmonary embolism 12/11/2013   Nodule of right lung 12/11/2013   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   Cystic disease of liver 08/21/2013    PCP: Adeline Hone, PA-C   REFERRING PROVIDER: Jodeen Munch, MD  REFERRING DIAG: [id number] (ICD-10-CM) - Cervical radiculopathy M54.50,G89.29 (ICD-10-CM) - Chronic bilateral low back pain without sciatica  Rationale for Evaluation and Treatment: Rehabilitation  THERAPY DIAG:  Cervicalgia  Other low back pain  Radiculopathy, lumbar region  Difficulty in walking, not elsewhere classified  ONSET DATE: November 2024  SUBJECTIVE:  SUBJECTIVE STATEMENT: Neck is about the same. 6/10 currently with R > L UE radiating symptoms to elbows. Felt a little better the day after 2nd session but pain returned to elevated levels the day after that.       PERTINENT HISTORY:  Cervicalgia, low back pain. Pt also reports R > L C5/C6 dermatome to elbow bilaterally. Pt has a difficult time turning his head and looking up. No neck surgery. Had Thoracic and lumbar spine fusion surgery. Had mid back surgery (thoracic) November 2024 due to his fall. Neck pain is more recent. Neck and shoulder  pain began November 2024 after his thoracic surgery. Usually sleeps on his L side but is currently difficulty to do secondary to B shoulder and neck pain, so pt is not sleeping well. Neck pain is the same since November 2024. Pt tends to have issues getting dizzy and passes out when he is standing and exerting himself  Vision gets blurry and splotchy. Room feels like it is waving around, pt is not walking on a stable surface, feels like he is walking on a boat and not having sea legs.   The IVC filter was removed.  Still has L LE DVT. Pt states taking Elequis. L LE DVT is still being treated.    PAIN:  Are you having pain? Yes: NPRS scale: /10 currently Pain location: neck (cervical paraspinals and upper trap) and B C5/C6 area to elbow Pain description: really really sore Aggravating factors: raising his arms up such as to reach, lifting with his arm, turning his head, looking up.  Relieving factors: Laying flat on his back on his bed with a pillow.    Pt also gets a stinging sensation at mid back around the T7 and above area, not all the time   Are you having pain? Yes: NPRS scale: /10 currently (pt sitting on a chair) Pain location: L low back pain; L L4 dermatome radiating symptoms to big toe.  Pain description: stinging, burning, ache Aggravating factors: sitting for long periods, about 15-20 minutes, trying to move around, bending lifting,   Relieving factors: Laying flat on his back, on the bed  PRECAUTIONS: dizzy and passes out when he is standing and exerting himself  L LE DVT, fall risk  RED FLAGS: Bowel or bladder incontinence: No and Cauda equina syndrome: No   WEIGHT BEARING RESTRICTIONS: No  FALLS:  Has patient fallen in last 6 months? Yes. Number of falls 2 (original fall was when pt had a heart attack November 2024 and hurt his back. February 22, 2024, pt passed out and fell into the bath tub.   LIVING ENVIRONMENT: Lives with: lives alone Lives in:  House/apartment Stairs: No Has following equipment at home: Single point cane and Environmental consultant - 2 wheeled  OCCUPATION: Not currently working  PLOF: Needs assistance with homemaking   PATIENT GOALS: be able to do everything better.   NEXT MD VISIT: Yes but does not know when.   OBJECTIVE:  Note: Objective measures were completed at Evaluation unless otherwise noted.  DIAGNOSTIC FINDINGS:  MR CERVICAL SPINE WO CONTRAST 02/26/2024  Narrative & Impression  CLINICAL DATA:  Myelopathy, acute, cervical spine   EXAM: MRI CERVICAL SPINE WITHOUT CONTRAST   TECHNIQUE: Multiplanar, multisequence MR imaging of the cervical spine was performed. No intravenous contrast was administered.   COMPARISON:  CT of the cervical spine November 05, 2023.   FINDINGS: Alignment: No substantial sagittal subluxation.   Vertebrae: No evidence of acute fracture, evidence of  discitis, or suspicious bone lesion. Benign vertebral venous malformation at T3. Mild T1-T2 height loss, unchanged.   Cord: Normal cord signal.  Neck   Posterior Fossa, vertebral arteries, paraspinal tissues: Visualized vertebral artery flow voids are maintained. No paraspinal edema.   Disc levels:   C2-C3: No significant disc protrusion, foraminal stenosis, or canal stenosis.   C3-C4: No significant disc protrusion, foraminal stenosis, or canal stenosis.   C4-C5: Right eccentric posterior disc osteophyte complex with right greater than left facet and uncovertebral hypertrophy. Resulting mild right greater than left foraminal stenosis. Mild canal stenosis.   C5-C6: Posterior disc osteophyte complex ligamentum flavum thickening and right greater than left facet and uncovertebral hypertrophy. Resulting mild-to-moderate bilateral foraminal stenosis. Mild to moderate canal stenosis.   C6-C7: Posterior disc osteophyte complex with bilateral facet and uncovertebral hypertrophy. Resulting moderate bilateral foraminal stenosis.  Mild canal stenosis.   C7-T1: No significant disc protrusion, foraminal stenosis, or canal stenosis.   IMPRESSION: 1. 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 2. At C5-C6, mild to moderate canal and bilateral foraminal stenosis. 3. At C4-C5, mild canal and bilateral foraminal stenosis.     Electronically Signed   By: Stevenson Elbe M.D.   On: 02/27/2024 02:42         PATIENT SURVEYS:  Neck Disability Index score: 41/50 (82%) (05/02/2024)  COGNITION: Overall cognitive status: Within functional limits for tasks assessed     SENSATION:   MUSCLE LENGTH:   POSTURE:   PALPATION: TTP to posterior neck   CERVICAL ROM:   Active ROM A/PROM (deg) eval  Flexion WFL with posterior neck pain  Extension Very limited with pain, movement preference around C5/C6 area  Right lateral flexion Very limited with pain  Left lateral flexion Very limited with pain  Right rotation 20 degrees with pain  Left rotation 22 degrees with pain   (Blank rows = not tested)  UPPER EXTREMITY MMT:  MMT Right eval Left eval  Shoulder flexion 3+ with neck and shoulder and arm pain 4- with pain at available range.   Shoulder extension    Shoulder abduction    Shoulder adduction    Shoulder extension    Shoulder internal rotation    Shoulder external rotation    Middle trapezius    Lower trapezius (seated manually resisted) 3+ with neck pain 3+ with neck pain  Elbow flexion    Elbow extension    Wrist flexion    Wrist extension    Wrist ulnar deviation    Wrist radial deviation    Wrist pronation    Wrist supination    Grip strength     (Blank rows = not tested)  UPPER EXTREMITY ROM:  Active ROM Right eval Left eval  Shoulder flexion 55 (scaption) 55 (scaption)  Shoulder extension    Shoulder abduction    Shoulder adduction    Shoulder extension    Shoulder internal rotation    Shoulder external rotation    Elbow flexion    Elbow  extension    Wrist flexion    Wrist extension    Wrist ulnar deviation    Wrist radial deviation    Wrist pronation    Wrist supination     (Blank rows = not tested)    LUMBAR ROM:   AROM eval  Flexion   Extension   Right lateral flexion   Left lateral flexion   Right rotation   Left rotation    (Blank rows = not  tested)  LOWER EXTREMITY ROM:     Passive  Right eval Left eval  Hip flexion    Hip extension    Hip abduction    Hip adduction    Hip internal rotation    Hip external rotation    Knee flexion    Knee extension    Ankle dorsiflexion    Ankle plantarflexion    Ankle inversion    Ankle eversion     (Blank rows = not tested)  LOWER EXTREMITY MMT:    MMT Right eval Left eval  Hip flexion    Hip extension    Hip abduction    Hip adduction    Hip internal rotation    Hip external rotation    Knee flexion    Knee extension    Ankle dorsiflexion    Ankle plantarflexion    Ankle inversion    Ankle eversion     (Blank rows = not tested)  LUMBAR SPECIAL TESTS:    FUNCTIONAL TESTS:    GAIT: Distance walked: 30 ft Assistive device utilized: None Level of assistance: SBA Comments: antalgic, decreased stance R LE, forward flexed, decreased gait speed   TREATMENT DATE: 05/17/2024                                                                                                                               Therapeutic exercise  At start of session: Blood pressure L arm sitting, mechanically taken, normal cuff: 123/62, HR 72 SpO2 room air: 100%  Seated B scapular retraction 10x  Seated gentle manually resisted  scapular depression isometrics   R 10x5 seconds for 3 sets   L 10x5 seconds for 3 sets   Decreased L upper trap muscle tension felt during exercise for L scapula   R radial nerve symptoms afterwards  Seated cervical flexion at comfortable range 5x5 seconds   Seated chin tuck in slight cervical flexion 5x4  Neck discomfort,  eases with rest, centralization of symptoms/decreased B UE symptoms.      Improved exercise technique, movement at target joints, use of target muscles after mod verbal, visual, tactile cues.     Manual therapy   Seated STM R infraspinatus muscle area to decrease tension. Decreased R radial symptoms at forearm reported.   Seated STM R pectoralis major. Tender, uncomfortable  Seated STM R upper trap area to decrease tension.   Seated STM R > L cervical paraspinal muscles to decrease tension         PATIENT EDUCATION:  Education details: POC Person educated: Patient Education method: Explanation Education comprehension: verbalized understanding  HOME EXERCISE PROGRAM: Pt was recommended to press his tongue at the roof of his mouth to activate his anterior cervical muscles throughout the day. Pt demonstrated and verbalized understanding.    Access Code: [email or web address] URL: [email or web address] Date: 05/10/2024 Prepared by: Suzzane Estes  Exercises - Seated Cervical Flexion AROM  - 2 x daily - 7  x weekly - 3 sets - 5 reps - Seated Transversus Abdominis Bracing  - 1 x daily - 7 x weekly - 2 -3 sets - 10 reps - 5 seconds hold - Seated Chin Tuck with Neck Elongation  - 2-3 x daily - 7 x weekly - 2-3 sets - 5 reps  ASSESSMENT:  CLINICAL IMPRESSION: Irritable symptoms. Centralization of symptoms with chin tuck with neck in slight flexion to decrease stress to B UE nerves. Increased neck discomfort with the chin tucks which eased with rest. Continued working on scapular strengthening and decreasing B upper trap muscle tension to neck as tolerated. Fair tolerance to today's session. Pt currently limited by pain and medical history. Pt will benefit from continued skilled physical therapy services to decrease pain, improve strength and function.     OBJECTIVE IMPAIRMENTS: Abnormal gait, decreased balance, decreased endurance, difficulty walking, decreased ROM, decreased  strength, dizziness, impaired UE functional use, improper body mechanics, postural dysfunction, and pain.   ACTIVITY LIMITATIONS: carrying, lifting, bending, sitting, standing, squatting, sleeping, stairs, transfers, bed mobility, bathing, toileting, dressing, reach over head, hygiene/grooming, locomotion level, and caring for others  PARTICIPATION LIMITATIONS:   PERSONAL FACTORS: Age, Fitness, Past/current experiences, Time since onset of injury/illness/exacerbation, and 3+ comorbidities: DVT, depression, dyspnea, heart attack, HTN, osteoporosis, are also affecting patient's functional outcome.   REHAB POTENTIAL: Fair    CLINICAL DECISION MAKING: Stable/uncomplicated  EVALUATION COMPLEXITY: Low   GOALS: Goals reviewed with patient? Yes  SHORT TERM GOALS: Target date: 05/13/2024  Pt will be independent with his initial HEP to improve strength, decrease pain, improve ability to lift, look around as well as ambulate and perform standing tasks with less difficulty and pain. Baseline: Pt has not yet officially started his initial HEP (05/02/2024) Goal status: INITIAL    LONG TERM GOALS: Target date: 07/01/2024  Pt will have a decrease in neck pain to 5/10 or less at worst to promote ability to look around more comfortably.  Baseline: 9/10 neck pain at worst for the past 3 months (05/02/2024) Goal status: INITIAL  2.  Pt will have a decrease on low back pain to 4/10 or less at worst to promote ability to tolerate sitting, standing, walking and performing standing tasks more comfortably for his back.  Baseline: 8/10 low back pain at worst for  Goal status: INITIAL  3.  Pt will improve his Neck Disability Index (NDI) score by at least 20 % as a demonstration of improved function.  Baseline: Neck Disability Index score: 41/50 (82%) (05/02/2024) Goal status: INITIAL  4.  Pt will improve his cervical rotation AROM to 50 degrees or more to promote ability to look around more  comfortably. Baseline:  Active ROM A/PROM (deg) eval  Right rotation 20 degrees with pain  Left rotation 22 degrees with pain   (05/02/2024)  Goal status: INITIAL  5.  Pt will improve B shoulder flexion AROM to 100 degrees or more to promote ability to reach as well as lift more comfortably.  Baseline:  Active ROM Right eval Left eval  Shoulder flexion 55 (scaption) 55 (scaption)   (05/02/2024)  Goal status: INITIAL  6.  Pt will improve his shoulder flexion and lower trap strength by at least 1/2 MMT grade to promote ability to raise his arms as well as lift with less difficulty.  Baseline:  MMT Right eval Left eval  Shoulder flexion 3+ with neck and shoulder and arm pain 4- with pain at available range.   Lower trapezius (seated  manually resisted) 3+ with neck pain 3+ with neck pain   (05/02/2024)   Goal status: INITIAL   PLAN:  PT FREQUENCY: 1-2x/week  PT DURATION: 8 weeks  PLANNED INTERVENTIONS: 97110-Therapeutic exercises, 97530- Therapeutic activity, [id number]- Neuromuscular re-education, 97535- Self Care, [id number]- Manual therapy, [phone]- Gait training, [phone]- Electrical stimulation (unattended), [phone]- Ionotophoresis 4mg /ml Dexamethasone , Patient/Family education, and Balance training.  PLAN FOR NEXT SESSION: posture, B scapular, anterior cervical, trunk and glute strengthening, mechanics, manual techniques, modalities PRN   Margorie Renner, PT, DPT 05/17/2024, 3:32 PM

## 2024-05-19 ENCOUNTER — Ambulatory Visit

## 2024-05-19 DIAGNOSIS — R42 Dizziness and giddiness: Secondary | ICD-10-CM | POA: Diagnosis not present

## 2024-05-19 DIAGNOSIS — M542 Cervicalgia: Secondary | ICD-10-CM | POA: Diagnosis not present

## 2024-05-19 DIAGNOSIS — M5416 Radiculopathy, lumbar region: Secondary | ICD-10-CM

## 2024-05-19 DIAGNOSIS — M5412 Radiculopathy, cervical region: Secondary | ICD-10-CM | POA: Diagnosis not present

## 2024-05-19 DIAGNOSIS — F332 Major depressive disorder, recurrent severe without psychotic features: Secondary | ICD-10-CM | POA: Diagnosis not present

## 2024-05-19 DIAGNOSIS — M5459 Other low back pain: Secondary | ICD-10-CM

## 2024-05-19 DIAGNOSIS — R55 Syncope and collapse: Secondary | ICD-10-CM | POA: Diagnosis not present

## 2024-05-19 DIAGNOSIS — M545 Low back pain, unspecified: Secondary | ICD-10-CM | POA: Diagnosis not present

## 2024-05-19 DIAGNOSIS — G8929 Other chronic pain: Secondary | ICD-10-CM | POA: Diagnosis not present

## 2024-05-19 DIAGNOSIS — R262 Difficulty in walking, not elsewhere classified: Secondary | ICD-10-CM | POA: Diagnosis not present

## 2024-05-19 NOTE — Therapy (Signed)
 OUTPATIENT PHYSICAL THERAPY Treatment   Patient Name: Jonathon Snow MRN: [id number] DOB:[birth year], 63 y.o., male Today's Date: 05/19/2024  END OF SESSION:  PT End of Session - 05/19/24 0944     Visit Number 5    Number of Visits 17    Date for PT Re-Evaluation 07/01/24    PT Start Time 0944    PT Stop Time 1025    PT Time Calculation (min) 41 min    Activity Tolerance Patient tolerated treatment well;Patient limited by pain    Behavior During Therapy [facility] for tasks assessed/performed                 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   Overview:  superior sagittal sinus, left transverse sinus and cortical veins    Closed left hip fracture, initial encounter (HCC) 02/18/2023   COVID-19 virus infection 12/2020   Depression    DVT (deep venous thrombosis) (HCC)    Dyspnea    GERD (gastroesophageal reflux disease)    Head injury 11/05/2023   Heart attack (HCC) 11/05/2023   Heel spur, left 02/16/2019   Heel spur, right 02/16/2019   Hematoma of groin 11/08/2023   Herpes zoster infection of lumbar region 02/20/2020   History of DVT (deep vein thrombosis) 03/22/2020   History of kidney stones    Hyperlipidemia    Hypertension    Long term current use of systemic steroids 11/08/2020   Lupus    Lymphedema 10/07/2018   Morbid obesity (HCC)    Opiate abuse, episodic (HCC) 02/26/2018   Osteoporosis    Pain and swelling of right lower extremity 02/03/2023   Pneumonia    PONV (postoperative nausea and vomiting)    Postphlebitic syndrome with ulcer, 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   Presence of IVC filter 03/22/2020   Removed   Pulmonary embolism (HCC)    Renal disorder    Stage III   Severe episode of recurrent major depressive disorder,  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   Shock circulatory (HCC) 11/06/2023   STEMI (ST elevation myocardial infarction) (HCC) 11/05/2023   Past Surgical History:  Procedure Laterality Date   ANKLE SURGERY Right    BACK SURGERY     BRONCHIAL WASHINGS N/A 11/01/2021   Procedure: BRONCHIAL WASHINGS;  Surgeon: Erskin Hearing, MD;  Location: [facility] ORS;  Service: Thoracic;  Laterality: N/A;   COLONOSCOPY WITH PROPOFOL  N/A 05/28/2020   Procedure: COLONOSCOPY WITH PROPOFOL ;  Surgeon: Luke Salaam, MD;  Location: [facility] ENDOSCOPY;  Service: Endoscopy;  Laterality: N/A;   COLONOSCOPY WITH PROPOFOL  N/A 07/01/2023   Procedure: COLONOSCOPY WITH PROPOFOL ;  Surgeon: Luke Salaam, MD;  Location: [facility] ENDOSCOPY;  Service: Gastroenterology;  Laterality: N/A;   CORONARY ULTRASOUND/IVUS N/A 11/05/2023   Procedure: Coronary Ultrasound/IVUS;  Surgeon: Sammy Crisp, MD;  Location: [facility] INVASIVE CV LAB;  Service: Cardiovascular;  Laterality: N/A;   CORONARY/GRAFT ACUTE MI REVASCULARIZATION N/A 11/05/2023   Procedure: Coronary/Graft Acute MI Revascularization;  Surgeon: Sammy Crisp, MD;  Location: [facility] INVASIVE CV LAB;  Service: Cardiovascular;  Laterality: N/A;   CYST EXCISION  92 or 93    Liver cyst removal [facility]   FLEXIBLE BRONCHOSCOPY N/A 11/01/2021   Procedure: FLEXIBLE BRONCHOSCOPY;  Surgeon: Erskin Hearing, MD;  Location: [facility] ORS;  Service: Thoracic;  Laterality: N/A;  HIP PINNING,CANNULATED Left 02/19/2023   Procedure: PERCUTANEOUS FIXATION OF FEMORAL NECK;  Surgeon: Lorri Rota, MD;  Location: [facility] ORS;  Service: Orthopedics;  Laterality: Left;   I & D EXTREMITY Right 04/29/2017   Procedure: IRRIGATION AND DEBRIDEMENT EXTREMITY;  Surgeon: Gwyndolyn Lerner, MD;  Location: [facility] ORS;  Service: General;  Laterality: Right;   IRRIGATION AND DEBRIDEMENT ABSCESS Left 04/29/2017   Procedure: IRRIGATION AND DEBRIDEMENT Scrotal ABSCESS;  Surgeon: Gwyndolyn Lerner, MD;  Location: [facility] ORS;  Service:  General;  Laterality: Left;   LEFT HEART CATH AND CORONARY ANGIOGRAPHY N/A 11/05/2023   Procedure: LEFT HEART CATH AND CORONARY ANGIOGRAPHY;  Surgeon: Sammy Crisp, MD;  Location: [facility] INVASIVE CV LAB;  Service: Cardiovascular;  Laterality: N/A;   POLYPECTOMY  07/01/2023   Procedure: POLYPECTOMY;  Surgeon: Luke Salaam, MD;  Location: [facility] ENDOSCOPY;  Service: Gastroenterology;;   Patient Active Problem List   Diagnosis Date Noted   Anxiety and depression 02/26/2024   Dyslipidemia 02/26/2024   Peripheral neuropathy 02/26/2024   Essential hypertension 02/26/2024   Vertigo 02/26/2024   Syncope 02/25/2024   Orthostatic hypotension 02/11/2024   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) 01/20/2024   Anxiety 12/14/2023   Overweight (BMI 25.0-29.9) 11/23/2023   Chronic heart failure with preserved ejection fraction (HFpEF) (HCC) 11/23/2023   BPH (benign prostatic hyperplasia) 11/23/2023   Unstable angina (HCC) 11/10/2023   Closed T10 spinal fracture (HCC) 11/06/2023   Closed tricolumnar fracture of thoracic vertebra (HCC) 11/06/2023   Thoracic spine instability 11/06/2023   Chronic bilateral low back pain with left-sided sciatica 07/07/2023   Neuropathy 07/07/2023   Hx of colonic polyps 07/01/2023   Adenomatous polyp of colon 07/01/2023   Erectile disorder 01/22/2023   Coronary artery disease involving native coronary artery of native heart without angina pectoris 09/09/2022   Varicose 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 01/03/2022   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) 09/18/2021   Prediabetes 11/08/2020   Stage 3a chronic kidney disease (HCC) 11/08/2020   Seasonal allergies 11/08/2020   Rhinosinusitis 11/08/2020   Anticoagulant disorder (HCC) 11/07/2020   Senile purpura (HCC) 11/07/2020   MDD (major depressive disorder), recurrent episode, mild (HCC) 09/12/2019   Other spondylosis with radiculopathy, lumbar region 02/16/2019    Osteoporosis 10/12/2018   Chronic venous insufficiency 10/07/2018   Lymphedema 10/07/2018   SLE glomerulonephritis syndrome, 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HCC) 03/03/2018   Syncope and collapse 12/29/2017   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) 10/29/2016   Hyperlipidemia LDL goal <70 01/15/2016   Uncontrolled hypertension 01/15/2016   Obesity (BMI 30.0-34.9) 01/15/2016   Long term current use of anticoagulant 10/04/2015   Systemic lupus erythematosus (HCC) 05/30/2015   COPD, moderate (HCC) 06/27/2014   Shortness of breath 06/27/2014   History of pulmonary embolism 12/11/2013   Nodule of right lung 12/11/2013   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   Cystic disease of liver 08/21/2013    PCP: Adeline Hone, PA-C   REFERRING PROVIDER: Jodeen Munch, MD  REFERRING DIAG: [phone] (ICD-10-CM) - Cervical radiculopathy M54.50,G89.29 (ICD-10-CM) - Chronic bilateral low back pain without sciatica  Rationale for Evaluation and Treatment: Rehabilitation  THERAPY DIAG:  Cervicalgia  Other low back pain  Difficulty in walking, not elsewhere classified  Radiculopathy, cervical region  Radiculopathy, lumbar region  ONSET DATE: November 2024  SUBJECTIVE:  SUBJECTIVE STATEMENT: Neck is about a 5/10 currently. A little bit better.       PERTINENT HISTORY:  Cervicalgia, low back pain. Pt also reports R > L C5/C6 dermatome to elbow bilaterally. Pt has a difficult time turning his head and looking up. No neck surgery. Had Thoracic and lumbar spine fusion surgery. Had mid back surgery (thoracic) November 2024 due to his fall. Neck pain is more recent. Neck and shoulder pain began November 2024 after his thoracic surgery. Usually sleeps on his L side but is  currently difficulty to do secondary to B shoulder and neck pain, so pt is not sleeping well. Neck pain is the same since November 2024. Pt tends to have issues getting dizzy and passes out when he is standing and exerting himself  Vision gets blurry and splotchy. Room feels like it is waving around, pt is not walking on a stable surface, feels like he is walking on a boat and not having sea legs.   The IVC filter was removed.  Still has L LE DVT. Pt states taking Elequis. L LE DVT is still being treated.    PAIN:  Are you having pain? Yes: NPRS scale: /10 currently Pain location: neck (cervical paraspinals and upper trap) and B C5/C6 area to elbow Pain description: really really sore Aggravating factors: raising his arms up such as to reach, lifting with his arm, turning his head, looking up.  Relieving factors: Laying flat on his back on his bed with a pillow.    Pt also gets a stinging sensation at mid back around the T7 and above area, not all the time   Are you having pain? Yes: NPRS scale: /10 currently (pt sitting on a chair) Pain location: L low back pain; L L4 dermatome radiating symptoms to big toe.  Pain description: stinging, burning, ache Aggravating factors: sitting for long periods, about 15-20 minutes, trying to move around, bending lifting,   Relieving factors: Laying flat on his back, on the bed  PRECAUTIONS: dizzy and passes out when he is standing and exerting himself  L LE DVT, fall risk  RED FLAGS: Bowel or bladder incontinence: No and Cauda equina syndrome: No   WEIGHT BEARING RESTRICTIONS: No  FALLS:  Has patient fallen in last 6 months? Yes. Number of falls 2 (original fall was when pt had a heart attack November 2024 and hurt his back. February 22, 2024, pt passed out and fell into the bath tub.   LIVING ENVIRONMENT: Lives with: lives alone Lives in: House/apartment Stairs: No Has following equipment at home: Single point cane and Environmental consultant - 2  wheeled  OCCUPATION: Not currently working  PLOF: Needs assistance with homemaking   PATIENT GOALS: be able to do everything better.   NEXT MD VISIT: Yes but does not know when.   OBJECTIVE:  Note: Objective measures were completed at Evaluation unless otherwise noted.  DIAGNOSTIC FINDINGS:  MR CERVICAL SPINE WO CONTRAST 02/26/2024  Narrative & Impression  CLINICAL DATA:  Myelopathy, acute, cervical spine   EXAM: MRI CERVICAL SPINE WITHOUT CONTRAST   TECHNIQUE: Multiplanar, multisequence MR imaging of the cervical spine was performed. No intravenous contrast was administered.   COMPARISON:  CT of the cervical spine November 05, 2023.   FINDINGS: Alignment: No substantial sagittal subluxation.   Vertebrae: No evidence of acute fracture, evidence of discitis, or suspicious bone lesion. Benign vertebral venous malformation at T3. Mild T1-T2 height loss, unchanged.   Cord: Normal cord signal.  Neck  Posterior Fossa, vertebral arteries, paraspinal tissues: Visualized vertebral artery flow voids are maintained. No paraspinal edema.   Disc levels:   C2-C3: No significant disc protrusion, foraminal stenosis, or canal stenosis.   C3-C4: No significant disc protrusion, foraminal stenosis, or canal stenosis.   C4-C5: Right eccentric posterior disc osteophyte complex with right greater than left facet and uncovertebral hypertrophy. Resulting mild right greater than left foraminal stenosis. Mild canal stenosis.   C5-C6: Posterior disc osteophyte complex ligamentum flavum thickening and right greater than left facet and uncovertebral hypertrophy. Resulting mild-to-moderate bilateral foraminal stenosis. Mild to moderate canal stenosis.   C6-C7: Posterior disc osteophyte complex with bilateral facet and uncovertebral hypertrophy. Resulting moderate bilateral foraminal stenosis. Mild canal stenosis.   C7-T1: No significant disc protrusion, foraminal stenosis, or  canal stenosis.   IMPRESSION: 1. 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 2. At C5-C6, mild to moderate canal and bilateral foraminal stenosis. 3. At C4-C5, mild canal and bilateral foraminal stenosis.     Electronically Signed   By: Stevenson Elbe M.D.   On: 02/27/2024 02:42         PATIENT SURVEYS:  Neck Disability Index score: 41/50 (82%) (05/02/2024)  COGNITION: Overall cognitive status: Within functional limits for tasks assessed     SENSATION:   MUSCLE LENGTH:   POSTURE:   PALPATION: TTP to posterior neck   CERVICAL ROM:   Active ROM A/PROM (deg) eval  Flexion WFL with posterior neck pain  Extension Very limited with pain, movement preference around C5/C6 area  Right lateral flexion Very limited with pain  Left lateral flexion Very limited with pain  Right rotation 20 degrees with pain  Left rotation 22 degrees with pain   (Blank rows = not tested)  UPPER EXTREMITY MMT:  MMT Right eval Left eval  Shoulder flexion 3+ with neck and shoulder and arm pain 4- with pain at available range.   Shoulder extension    Shoulder abduction    Shoulder adduction    Shoulder extension    Shoulder internal rotation    Shoulder external rotation    Middle trapezius    Lower trapezius (seated manually resisted) 3+ with neck pain 3+ with neck pain  Elbow flexion    Elbow extension    Wrist flexion    Wrist extension    Wrist ulnar deviation    Wrist radial deviation    Wrist pronation    Wrist supination    Grip strength     (Blank rows = not tested)  UPPER EXTREMITY ROM:  Active ROM Right eval Left eval  Shoulder flexion 55 (scaption) 55 (scaption)  Shoulder extension    Shoulder abduction    Shoulder adduction    Shoulder extension    Shoulder internal rotation    Shoulder external rotation    Elbow flexion    Elbow extension    Wrist flexion    Wrist extension    Wrist ulnar deviation    Wrist radial  deviation    Wrist pronation    Wrist supination     (Blank rows = not tested)    LUMBAR ROM:   AROM eval  Flexion   Extension   Right lateral flexion   Left lateral flexion   Right rotation   Left rotation    (Blank rows = not tested)  LOWER EXTREMITY ROM:     Passive  Right eval Left eval  Hip flexion    Hip extension  Hip abduction    Hip adduction    Hip internal rotation    Hip external rotation    Knee flexion    Knee extension    Ankle dorsiflexion    Ankle plantarflexion    Ankle inversion    Ankle eversion     (Blank rows = not tested)  LOWER EXTREMITY MMT:    MMT Right eval Left eval  Hip flexion    Hip extension    Hip abduction    Hip adduction    Hip internal rotation    Hip external rotation    Knee flexion    Knee extension    Ankle dorsiflexion    Ankle plantarflexion    Ankle inversion    Ankle eversion     (Blank rows = not tested)  LUMBAR SPECIAL TESTS:    FUNCTIONAL TESTS:    GAIT: Distance walked: 30 ft Assistive device utilized: None Level of assistance: SBA Comments: antalgic, decreased stance R LE, forward flexed, decreased gait speed   TREATMENT DATE: 05/19/2024                                                                                                                               Neuromuscular re education  At start of session: Blood pressure L arm sitting, mechanically taken, normal cuff: 122/62, HR 72 SpO2 room air: 99%  Seated chin tuck in slight cervical flexion 5x4 with 5 second holds  With neck in slight flexion   Seated B scapular retraction 5x3 for 2 times   Slight decrease in R C5/6 paresthesia to hand   Seated gentle manually resisted  scapular depression isometrics   R 10x5 seconds for 2 sets   R C5/C6 discomfort after second set   L 10x5 seconds for 2 sets   .  With neck is slight flexion  R and L cervical side bending, comfortable range of motion 5x4 each side .  Improving ROM  observed.    L scapular retraction secondary to L scapular abduction position compared to R  5x5 seconds for 3 sets  R UE symptoms, decreases immediately with rest  With neck in slight flexion   R and L cervical rotation, comfortable range 5x3  Seated chin tuck in slight cervical flexion 5x2 with 5 second holds    Improved exercise technique, movement at target joints, use of target muscles after mod verbal, visual, tactile cues.          PATIENT EDUCATION:  Education details: POC Person educated: Patient Education method: Explanation Education comprehension: verbalized understanding  HOME EXERCISE PROGRAM: Pt was recommended to press his tongue at the roof of his mouth to activate his anterior cervical muscles throughout the day. Pt demonstrated and verbalized understanding.    Access Code: [email or web address] URL: [email or web address] Date: 05/10/2024 Prepared by: Suzzane Estes  Exercises - Seated Cervical Flexion AROM  - 2 x daily - 7 x weekly - 3 sets -  5 reps - Seated Transversus Abdominis Bracing  - 1 x daily - 7 x weekly - 2 -3 sets - 10 reps - 5 seconds hold - Seated Chin Tuck with Neck Elongation  - 2-3 x daily - 7 x weekly - 2-3 sets - 5 reps - Seated Cervical Sidebending AROM  - 3 x daily - 7 x weekly - 4 sets - 5 reps    ASSESSMENT:  CLINICAL IMPRESSION: Slight improved neck comfort level based on subjective reports. Worked on improving R > L intervertebral foraminal space to decrease pressure to UE nerves. Pt tolerated session well without aggravation of symptoms. Pt will benefit from continued skilled physical therapy services to decrease pain, improve strength and function.     OBJECTIVE IMPAIRMENTS: Abnormal gait, decreased balance, decreased endurance, difficulty walking, decreased ROM, decreased strength, dizziness, impaired UE functional use, improper body mechanics, postural dysfunction, and pain.   ACTIVITY LIMITATIONS: carrying, lifting,  bending, sitting, standing, squatting, sleeping, stairs, transfers, bed mobility, bathing, toileting, dressing, reach over head, hygiene/grooming, locomotion level, and caring for others  PARTICIPATION LIMITATIONS:   PERSONAL FACTORS: Age, Fitness, Past/current experiences, Time since onset of injury/illness/exacerbation, and 3+ comorbidities: DVT, depression, dyspnea, heart attack, HTN, osteoporosis, are also affecting patient's functional outcome.   REHAB POTENTIAL: Fair    CLINICAL DECISION MAKING: Stable/uncomplicated  EVALUATION COMPLEXITY: Low   GOALS: Goals reviewed with patient? Yes  SHORT TERM GOALS: Target date: 05/13/2024  Pt will be independent with his initial HEP to improve strength, decrease pain, improve ability to lift, look around as well as ambulate and perform standing tasks with less difficulty and pain. Baseline: Pt has not yet officially started his initial HEP (05/02/2024) Goal status: INITIAL    LONG TERM GOALS: Target date: 07/01/2024  Pt will have a decrease in neck pain to 5/10 or less at worst to promote ability to look around more comfortably.  Baseline: 9/10 neck pain at worst for the past 3 months (05/02/2024) Goal status: INITIAL  2.  Pt will have a decrease on low back pain to 4/10 or less at worst to promote ability to tolerate sitting, standing, walking and performing standing tasks more comfortably for his back.  Baseline: 8/10 low back pain at worst for  Goal status: INITIAL  3.  Pt will improve his Neck Disability Index (NDI) score by at least 20 % as a demonstration of improved function.  Baseline: Neck Disability Index score: 41/50 (82%) (05/02/2024) Goal status: INITIAL  4.  Pt will improve his cervical rotation AROM to 50 degrees or more to promote ability to look around more comfortably. Baseline:  Active ROM A/PROM (deg) eval  Right rotation 20 degrees with pain  Left rotation 22 degrees with pain   (05/02/2024)  Goal status:  INITIAL  5.  Pt will improve B shoulder flexion AROM to 100 degrees or more to promote ability to reach as well as lift more comfortably.  Baseline:  Active ROM Right eval Left eval  Shoulder flexion 55 (scaption) 55 (scaption)   (05/02/2024)  Goal status: INITIAL  6.  Pt will improve his shoulder flexion and lower trap strength by at least 1/2 MMT grade to promote ability to raise his arms as well as lift with less difficulty.  Baseline:  MMT Right eval Left eval  Shoulder flexion 3+ with neck and shoulder and arm pain 4- with pain at available range.   Lower trapezius (seated manually resisted) 3+ with neck pain 3+ with neck pain   (  05/02/2024)   Goal status: INITIAL   PLAN:  PT FREQUENCY: 1-2x/week  PT DURATION: 8 weeks  PLANNED INTERVENTIONS: 97110-Therapeutic exercises, 97530- Therapeutic activity, [id number]- Neuromuscular re-education, 97535- Self Care, 69629- Manual therapy, [phone]- Gait training, [phone]- Electrical stimulation (unattended), [phone]- Ionotophoresis 4mg /ml Dexamethasone , Patient/Family education, and Balance training.  PLAN FOR NEXT SESSION: posture, B scapular, anterior cervical, trunk and glute strengthening, mechanics, manual techniques, modalities PRN   Layana Konkel, PT, DPT 05/19/2024, 10:27 AM

## 2024-05-20 ENCOUNTER — Telehealth: Payer: Self-pay | Admitting: Internal Medicine

## 2024-05-20 DIAGNOSIS — H9312 Tinnitus, left ear: Secondary | ICD-10-CM | POA: Diagnosis not present

## 2024-05-20 DIAGNOSIS — H9042 Sensorineural hearing loss, unilateral, left ear, with unrestricted hearing on the contralateral side: Secondary | ICD-10-CM | POA: Diagnosis not present

## 2024-05-20 NOTE — Telephone Encounter (Signed)
 Patient says he spoke with Dr. Nolan Battle and Dr. Daneil Dunker about getting a referral to [facility]. He says he contacted them and they informed him they haven't received it. He would like to know if it can be sent again and he would like a call back to confirm.

## 2024-05-20 NOTE — Telephone Encounter (Signed)
 Re faxed referral to [phone] for [facility]

## 2024-05-23 ENCOUNTER — Other Ambulatory Visit: Payer: Self-pay | Admitting: Otolaryngology

## 2024-05-23 DIAGNOSIS — Z79891 Long term (current) use of opiate analgesic: Secondary | ICD-10-CM | POA: Diagnosis not present

## 2024-05-23 DIAGNOSIS — G8921 Chronic pain due to trauma: Secondary | ICD-10-CM | POA: Diagnosis not present

## 2024-05-23 DIAGNOSIS — M5440 Lumbago with sciatica, unspecified side: Secondary | ICD-10-CM | POA: Diagnosis not present

## 2024-05-23 DIAGNOSIS — M5413 Radiculopathy, cervicothoracic region: Secondary | ICD-10-CM | POA: Diagnosis not present

## 2024-05-23 DIAGNOSIS — M791 Myalgia, unspecified site: Secondary | ICD-10-CM | POA: Diagnosis not present

## 2024-05-23 DIAGNOSIS — G894 Chronic pain syndrome: Secondary | ICD-10-CM | POA: Diagnosis not present

## 2024-05-23 DIAGNOSIS — M25512 Pain in left shoulder: Secondary | ICD-10-CM | POA: Diagnosis not present

## 2024-05-23 DIAGNOSIS — M4726 Other spondylosis with radiculopathy, lumbar region: Secondary | ICD-10-CM | POA: Diagnosis not present

## 2024-05-23 DIAGNOSIS — I25119 Atherosclerotic heart disease of native coronary artery with unspecified angina pectoris: Secondary | ICD-10-CM | POA: Diagnosis not present

## 2024-05-23 DIAGNOSIS — M5459 Other low back pain: Secondary | ICD-10-CM | POA: Diagnosis not present

## 2024-05-23 DIAGNOSIS — H9042 Sensorineural hearing loss, unilateral, left ear, with unrestricted hearing on the contralateral side: Secondary | ICD-10-CM

## 2024-05-23 DIAGNOSIS — M961 Postlaminectomy syndrome, not elsewhere classified: Secondary | ICD-10-CM | POA: Diagnosis not present

## 2024-05-23 DIAGNOSIS — F419 Anxiety disorder, unspecified: Secondary | ICD-10-CM | POA: Diagnosis not present

## 2024-05-24 ENCOUNTER — Ambulatory Visit: Attending: Neurosurgery

## 2024-05-24 ENCOUNTER — Ambulatory Visit: Admitting: Family Medicine

## 2024-05-24 DIAGNOSIS — M329 Systemic lupus erythematosus, unspecified: Secondary | ICD-10-CM | POA: Diagnosis not present

## 2024-05-24 DIAGNOSIS — R262 Difficulty in walking, not elsewhere classified: Secondary | ICD-10-CM | POA: Insufficient documentation

## 2024-05-24 DIAGNOSIS — M542 Cervicalgia: Secondary | ICD-10-CM | POA: Diagnosis present

## 2024-05-24 DIAGNOSIS — M5416 Radiculopathy, lumbar region: Secondary | ICD-10-CM | POA: Diagnosis present

## 2024-05-24 DIAGNOSIS — M5459 Other low back pain: Secondary | ICD-10-CM | POA: Insufficient documentation

## 2024-05-24 DIAGNOSIS — F332 Major depressive disorder, recurrent severe without psychotic features: Secondary | ICD-10-CM | POA: Diagnosis not present

## 2024-05-24 DIAGNOSIS — Z7960 Long term (current) use of unspecified immunomodulators and immunosuppressants: Secondary | ICD-10-CM | POA: Diagnosis not present

## 2024-05-24 DIAGNOSIS — M5412 Radiculopathy, cervical region: Secondary | ICD-10-CM | POA: Diagnosis present

## 2024-05-24 DIAGNOSIS — M3214 Glomerular disease in systemic lupus erythematosus: Secondary | ICD-10-CM | POA: Diagnosis not present

## 2024-05-24 NOTE — Therapy (Signed)
 OUTPATIENT PHYSICAL THERAPY Treatment   Patient Name: Jonathon Snow MRN: [id number] DOB:19-Jul-1961, 63 y.o., male Today's Date: 05/24/2024  END OF SESSION:  PT End of Session - 05/24/24 0945     Visit Number 6    Number of Visits 17    Date for PT Re-Evaluation 07/01/24    PT Start Time 0945    PT Stop Time 1031    PT Time Calculation (min) 46 min    Activity Tolerance Patient tolerated treatment well;Patient limited by pain    Behavior During Therapy [facility] for tasks assessed/performed                  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   Overview:  superior sagittal sinus, left transverse sinus and cortical veins    Closed left hip fracture, initial encounter (HCC) 02/18/2023   COVID-19 virus infection 12/2020   Depression    DVT (deep venous thrombosis) (HCC)    Dyspnea    GERD (gastroesophageal reflux disease)    Head injury 11/05/2023   Heart attack (HCC) 11/05/2023   Heel spur, left 02/16/2019   Heel spur, right 02/16/2019   Hematoma of groin 11/08/2023   Herpes zoster infection of lumbar region 02/20/2020   History of DVT (deep vein thrombosis) 03/22/2020   History of kidney stones    Hyperlipidemia    Hypertension    Long term current use of systemic steroids 11/08/2020   Lupus    Lymphedema 10/07/2018   Morbid obesity (HCC)    Opiate abuse, episodic (HCC) 02/26/2018   Osteoporosis    Pain and swelling of right lower extremity 02/03/2023   Pneumonia    PONV (postoperative nausea and vomiting)    Postphlebitic syndrome with ulcer, 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   Presence of IVC filter 03/22/2020   Removed   Pulmonary embolism (HCC)    Renal disorder    Stage III   Severe episode of recurrent major depressive disorder,  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   Shock circulatory (HCC) 11/06/2023   STEMI (ST elevation myocardial infarction) (HCC) 11/05/2023   Past Surgical History:  Procedure Laterality Date   ANKLE SURGERY Right    BACK SURGERY     BRONCHIAL WASHINGS N/A 11/01/2021   Procedure: BRONCHIAL WASHINGS;  Surgeon: Erskin Hearing, MD;  Location: [facility] ORS;  Service: Thoracic;  Laterality: N/A;   COLONOSCOPY WITH PROPOFOL  N/A 05/28/2020   Procedure: COLONOSCOPY WITH PROPOFOL ;  Surgeon: Luke Salaam, MD;  Location: [facility] ENDOSCOPY;  Service: Endoscopy;  Laterality: N/A;   COLONOSCOPY WITH PROPOFOL  N/A 07/01/2023   Procedure: COLONOSCOPY WITH PROPOFOL ;  Surgeon: Luke Salaam, MD;  Location: [facility] ENDOSCOPY;  Service: Gastroenterology;  Laterality: N/A;   CORONARY ULTRASOUND/IVUS N/A 11/05/2023   Procedure: Coronary Ultrasound/IVUS;  Surgeon: Sammy Crisp, MD;  Location: [facility] INVASIVE CV LAB;  Service: Cardiovascular;  Laterality: N/A;   CORONARY/GRAFT ACUTE MI REVASCULARIZATION N/A 11/05/2023   Procedure: Coronary/Graft Acute MI Revascularization;  Surgeon: Sammy Crisp, MD;  Location: [facility] INVASIVE CV LAB;  Service: Cardiovascular;  Laterality: N/A;   CYST EXCISION  92 or 93    Liver cyst removal [facility]   FLEXIBLE BRONCHOSCOPY N/A 11/01/2021   Procedure: FLEXIBLE BRONCHOSCOPY;  Surgeon: Erskin Hearing, MD;  Location: [facility] ORS;  Service: Thoracic;  Laterality: N/A;  HIP PINNING,CANNULATED Left 02/19/2023   Procedure: PERCUTANEOUS FIXATION OF FEMORAL NECK;  Surgeon: Lorri Rota, MD;  Location: [facility] ORS;  Service: Orthopedics;  Laterality: Left;   I & D EXTREMITY Right 04/29/2017   Procedure: IRRIGATION AND DEBRIDEMENT EXTREMITY;  Surgeon: Gwyndolyn Lerner, MD;  Location: [facility] ORS;  Service: General;  Laterality: Right;   IRRIGATION AND DEBRIDEMENT ABSCESS Left 04/29/2017   Procedure: IRRIGATION AND DEBRIDEMENT Scrotal ABSCESS;  Surgeon: Gwyndolyn Lerner, MD;  Location: [facility] ORS;  Service:  General;  Laterality: Left;   LEFT HEART CATH AND CORONARY ANGIOGRAPHY N/A 11/05/2023   Procedure: LEFT HEART CATH AND CORONARY ANGIOGRAPHY;  Surgeon: Sammy Crisp, MD;  Location: [facility] INVASIVE CV LAB;  Service: Cardiovascular;  Laterality: N/A;   POLYPECTOMY  07/01/2023   Procedure: POLYPECTOMY;  Surgeon: Luke Salaam, MD;  Location: [facility] ENDOSCOPY;  Service: Gastroenterology;;   Patient Active Problem List   Diagnosis Date Noted   Anxiety and depression 02/26/2024   Dyslipidemia 02/26/2024   Peripheral neuropathy 02/26/2024   Essential hypertension 02/26/2024   Vertigo 02/26/2024   Syncope 02/25/2024   Orthostatic hypotension 02/11/2024   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) 01/20/2024   Anxiety 12/14/2023   Overweight (BMI 25.0-29.9) 11/23/2023   Chronic heart failure with preserved ejection fraction (HFpEF) (HCC) 11/23/2023   BPH (benign prostatic hyperplasia) 11/23/2023   Unstable angina (HCC) 11/10/2023   Closed T10 spinal fracture (HCC) 11/06/2023   Closed tricolumnar fracture of thoracic vertebra (HCC) 11/06/2023   Thoracic spine instability 11/06/2023   Chronic bilateral low back pain with left-sided sciatica 07/07/2023   Neuropathy 07/07/2023   Hx of colonic polyps 07/01/2023   Adenomatous polyp of colon 07/01/2023   Erectile disorder 01/22/2023   Coronary artery disease involving native coronary artery of native heart without angina pectoris 09/09/2022   Varicose 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 01/03/2022   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) 09/18/2021   Prediabetes 11/08/2020   Stage 3a chronic kidney disease (HCC) 11/08/2020   Seasonal allergies 11/08/2020   Rhinosinusitis 11/08/2020   Anticoagulant disorder (HCC) 11/07/2020   Senile purpura (HCC) 11/07/2020   MDD (major depressive disorder), recurrent episode, mild (HCC) 09/12/2019   Other spondylosis with radiculopathy, lumbar region 02/16/2019    Osteoporosis 10/12/2018   Chronic venous insufficiency 10/07/2018   Lymphedema 10/07/2018   SLE glomerulonephritis syndrome, 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HCC) 03/03/2018   Syncope and collapse 12/29/2017   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) 10/29/2016   Hyperlipidemia LDL goal <70 01/15/2016   Uncontrolled hypertension 01/15/2016   Obesity (BMI 30.0-34.9) 01/15/2016   Long term current use of anticoagulant 10/04/2015   Systemic lupus erythematosus (HCC) 05/30/2015   COPD, moderate (HCC) 06/27/2014   Shortness of breath 06/27/2014   History of pulmonary embolism 12/11/2013   Nodule of right lung 12/11/2013   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   Cystic disease of liver 08/21/2013    PCP: Adeline Hone, PA-C   REFERRING PROVIDER: Jodeen Munch, MD  REFERRING DIAG: [phone] (ICD-10-CM) - Cervical radiculopathy M54.50,G89.29 (ICD-10-CM) - Chronic bilateral low back pain without sciatica  Rationale for Evaluation and Treatment: Rehabilitation  THERAPY DIAG:  Cervicalgia  Radiculopathy, cervical region  ONSET DATE: November 2024  SUBJECTIVE:  SUBJECTIVE STATEMENT: Neck is not as bad, around a 5/10 currently. R shoulder and R UE hurts really bad and woke him up at almost 3 am. Pt woke up on his L side. Feels R UE C8/T1 dermatome symptoms this morning to his 4-5th digits. Doctor had him stop taking gabapentin  which was replaced with a new medication.        PERTINENT HISTORY:  Cervicalgia, low back pain. Pt also reports R > L C5/C6 dermatome to elbow bilaterally. Pt has a difficult time turning his head and looking up. No neck surgery. Had Thoracic and lumbar spine fusion surgery. Had mid back surgery (thoracic) November 2024 due to his fall.  Neck pain is more recent. Neck and shoulder pain began November 2024 after his thoracic surgery. Usually sleeps on his L side but is currently difficulty to do secondary to B shoulder and neck pain, so pt is not sleeping well. Neck pain is the same since November 2024. Pt tends to have issues getting dizzy and passes out when he is standing and exerting himself  Vision gets blurry and splotchy. Room feels like it is waving around, pt is not walking on a stable surface, feels like he is walking on a boat and not having sea legs.   The IVC filter was removed.  Still has L LE DVT. Pt states taking Elequis. L LE DVT is still being treated.    PAIN:  Are you having pain? Yes: NPRS scale: /10 currently Pain location: neck (cervical paraspinals and upper trap) and B C5/C6 area to elbow Pain description: really really sore Aggravating factors: raising his arms up such as to reach, lifting with his arm, turning his head, looking up.  Relieving factors: Laying flat on his back on his bed with a pillow.    Pt also gets a stinging sensation at mid back around the T7 and above area, not all the time   Are you having pain? Yes: NPRS scale: /10 currently (pt sitting on a chair) Pain location: L low back pain; L L4 dermatome radiating symptoms to big toe.  Pain description: stinging, burning, ache Aggravating factors: sitting for long periods, about 15-20 minutes, trying to move around, bending lifting,   Relieving factors: Laying flat on his back, on the bed  PRECAUTIONS: dizzy and passes out when he is standing and exerting himself  L LE DVT, fall risk  RED FLAGS: Bowel or bladder incontinence: No and Cauda equina syndrome: No   WEIGHT BEARING RESTRICTIONS: No  FALLS:  Has patient fallen in last 6 months? Yes. Number of falls 2 (original fall was when pt had a heart attack November 2024 and hurt his back. February 22, 2024, pt passed out and fell into the bath tub.   LIVING ENVIRONMENT: Lives  with: lives alone Lives in: House/apartment Stairs: No Has following equipment at home: Single point cane and Environmental consultant - 2 wheeled  OCCUPATION: Not currently working  PLOF: Needs assistance with homemaking   PATIENT GOALS: be able to do everything better.   NEXT MD VISIT: Yes but does not know when.   OBJECTIVE:  Note: Objective measures were completed at Evaluation unless otherwise noted.  DIAGNOSTIC FINDINGS:  MR CERVICAL SPINE WO CONTRAST 02/26/2024  Narrative & Impression  CLINICAL DATA:  Myelopathy, acute, cervical spine   EXAM: MRI CERVICAL SPINE WITHOUT CONTRAST   TECHNIQUE: Multiplanar, multisequence MR imaging of the cervical spine was performed. No intravenous contrast was administered.   COMPARISON:  CT of the  cervical spine November 05, 2023.   FINDINGS: Alignment: No substantial sagittal subluxation.   Vertebrae: No evidence of acute fracture, evidence of discitis, or suspicious bone lesion. Benign vertebral venous malformation at T3. Mild T1-T2 height loss, unchanged.   Cord: Normal cord signal.  Neck   Posterior Fossa, vertebral arteries, paraspinal tissues: Visualized vertebral artery flow voids are maintained. No paraspinal edema.   Disc levels:   C2-C3: No significant disc protrusion, foraminal stenosis, or canal stenosis.   C3-C4: No significant disc protrusion, foraminal stenosis, or canal stenosis.   C4-C5: Right eccentric posterior disc osteophyte complex with right greater than left facet and uncovertebral hypertrophy. Resulting mild right greater than left foraminal stenosis. Mild canal stenosis.   C5-C6: Posterior disc osteophyte complex ligamentum flavum thickening and right greater than left facet and uncovertebral hypertrophy. Resulting mild-to-moderate bilateral foraminal stenosis. Mild to moderate canal stenosis.   C6-C7: Posterior disc osteophyte complex with bilateral facet and uncovertebral hypertrophy. Resulting moderate  bilateral foraminal stenosis. Mild canal stenosis.   C7-T1: No significant disc protrusion, foraminal stenosis, or canal stenosis.   IMPRESSION: 1. 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 2. At C5-C6, mild to moderate canal and bilateral foraminal stenosis. 3. At C4-C5, mild canal and bilateral foraminal stenosis.     Electronically Signed   By: Stevenson Elbe M.D.   On: 02/27/2024 02:42         PATIENT SURVEYS:  Neck Disability Index score: 41/50 (82%) (05/02/2024)  COGNITION: Overall cognitive status: Within functional limits for tasks assessed     SENSATION:   MUSCLE LENGTH:   POSTURE:   PALPATION: TTP to posterior neck   CERVICAL ROM:   Active ROM A/PROM (deg) eval  Flexion WFL with posterior neck pain  Extension Very limited with pain, movement preference around C5/C6 area  Right lateral flexion Very limited with pain  Left lateral flexion Very limited with pain  Right rotation 20 degrees with pain  Left rotation 22 degrees with pain   (Blank rows = not tested)  UPPER EXTREMITY MMT:  MMT Right eval Left eval  Shoulder flexion 3+ with neck and shoulder and arm pain 4- with pain at available range.   Shoulder extension    Shoulder abduction    Shoulder adduction    Shoulder extension    Shoulder internal rotation    Shoulder external rotation    Middle trapezius    Lower trapezius (seated manually resisted) 3+ with neck pain 3+ with neck pain  Elbow flexion    Elbow extension    Wrist flexion    Wrist extension    Wrist ulnar deviation    Wrist radial deviation    Wrist pronation    Wrist supination    Grip strength     (Blank rows = not tested)  UPPER EXTREMITY ROM:  Active ROM Right eval Left eval  Shoulder flexion 55 (scaption) 55 (scaption)  Shoulder extension    Shoulder abduction    Shoulder adduction    Shoulder extension    Shoulder internal rotation    Shoulder external rotation     Elbow flexion    Elbow extension    Wrist flexion    Wrist extension    Wrist ulnar deviation    Wrist radial deviation    Wrist pronation    Wrist supination     (Blank rows = not tested)    LUMBAR ROM:   AROM eval  Flexion   Extension  Right lateral flexion   Left lateral flexion   Right rotation   Left rotation    (Blank rows = not tested)  LOWER EXTREMITY ROM:     Passive  Right eval Left eval  Hip flexion    Hip extension    Hip abduction    Hip adduction    Hip internal rotation    Hip external rotation    Knee flexion    Knee extension    Ankle dorsiflexion    Ankle plantarflexion    Ankle inversion    Ankle eversion     (Blank rows = not tested)  LOWER EXTREMITY MMT:    MMT Right eval Left eval  Hip flexion    Hip extension    Hip abduction    Hip adduction    Hip internal rotation    Hip external rotation    Knee flexion    Knee extension    Ankle dorsiflexion    Ankle plantarflexion    Ankle inversion    Ankle eversion     (Blank rows = not tested)  LUMBAR SPECIAL TESTS:    FUNCTIONAL TESTS:    GAIT: Distance walked: 30 ft Assistive device utilized: None Level of assistance: SBA Comments: antalgic, decreased stance R LE, forward flexed, decreased gait speed   TREATMENT DATE: 05/24/2024                                                                                                                               Neuromuscular re education  At start of session: Blood pressure L arm sitting, mechanically taken, normal cuff: 148/85, HR 67 SpO2 room air: 100%  Seated chin tuck with slight cervical flexion position  L cervical side bend 5x4, pain free range  Decreased R UE symptoms   Seated gentle manually resisted R scapular depression isometrics 2x5 seconds. Increased R UE symptoms.   Seated R shoulder IR isometrics , hand on abdomen, comfortable level of effort 5x2  Increased R UE symptoms    Then with shoulder in  neutral, PT manual resistance 5x2   Increased R radial nerve symptoms  Seated gentle manually resisted R biceps flexion 5x2  Then 5x5 seconds. Increased symptoms but eases with rest.    Seated chin tuck in slight cervical flexion 5x, then 5x2 with 5 second holds   With neck in slight flexion   Seated B scapular retraction 5x2       Improved exercise technique, movement at target joints, use of target muscles after mod verbal, visual, tactile cues.      Manual therapy Seated STM R > L upper trap and distal scalene muscle area to decrease tension.  Decreased R UE symptoms.   Seated STM R posterior shoulder/infraspinatus area  Reproduction of R UE radial nerve symptoms.        PATIENT EDUCATION:  Education details: POC Person educated: Patient Education method: Explanation Education comprehension: verbalized understanding  HOME  EXERCISE PROGRAM: Pt was recommended to press his tongue at the roof of his mouth to activate his anterior cervical muscles throughout the day. Pt demonstrated and verbalized understanding.    Access Code: [email or web address] URL: [email or web address] Date: 05/10/2024 Prepared by: Suzzane Estes  Exercises - Seated Cervical Flexion AROM  - 2 x daily - 7 x weekly - 3 sets - 5 reps - Seated Transversus Abdominis Bracing  - 1 x daily - 7 x weekly - 2 -3 sets - 10 reps - 5 seconds hold - Seated Chin Tuck with Neck Elongation  - 2-3 x daily - 7 x weekly - 2-3 sets - 5 reps - Seated Cervical Sidebending AROM  - 3 x daily - 7 x weekly - 4 sets - 5 reps    ASSESSMENT:  CLINICAL IMPRESSION: Decreased R UE symptoms with treatment to decrease extension stress to R C8/T1 nerves as well as decreasing R scalene muscle tension. Reproduced R radial nerve symptoms R arm and forearm area with palpation to R infraspinatus muscle. Decreased irritability of symptoms observed. Fair tolerance to today's session. Pt will benefit from continued skilled physical  therapy services to decrease pain, improve strength and function.     OBJECTIVE IMPAIRMENTS: Abnormal gait, decreased balance, decreased endurance, difficulty walking, decreased ROM, decreased strength, dizziness, impaired UE functional use, improper body mechanics, postural dysfunction, and pain.   ACTIVITY LIMITATIONS: carrying, lifting, bending, sitting, standing, squatting, sleeping, stairs, transfers, bed mobility, bathing, toileting, dressing, reach over head, hygiene/grooming, locomotion level, and caring for others  PARTICIPATION LIMITATIONS:   PERSONAL FACTORS: Age, Fitness, Past/current experiences, Time since onset of injury/illness/exacerbation, and 3+ comorbidities: DVT, depression, dyspnea, heart attack, HTN, osteoporosis, are also affecting patient's functional outcome.   REHAB POTENTIAL: Fair    CLINICAL DECISION MAKING: Stable/uncomplicated  EVALUATION COMPLEXITY: Low   GOALS: Goals reviewed with patient? Yes  SHORT TERM GOALS: Target date: 05/13/2024  Pt will be independent with his initial HEP to improve strength, decrease pain, improve ability to lift, look around as well as ambulate and perform standing tasks with less difficulty and pain. Baseline: Pt has not yet officially started his initial HEP (05/02/2024) Goal status: INITIAL    LONG TERM GOALS: Target date: 07/01/2024  Pt will have a decrease in neck pain to 5/10 or less at worst to promote ability to look around more comfortably.  Baseline: 9/10 neck pain at worst for the past 3 months (05/02/2024) Goal status: INITIAL  2.  Pt will have a decrease on low back pain to 4/10 or less at worst to promote ability to tolerate sitting, standing, walking and performing standing tasks more comfortably for his back.  Baseline: 8/10 low back pain at worst for  Goal status: INITIAL  3.  Pt will improve his Neck Disability Index (NDI) score by at least 20 % as a demonstration of improved function.  Baseline: Neck  Disability Index score: 41/50 (82%) (05/02/2024) Goal status: INITIAL  4.  Pt will improve his cervical rotation AROM to 50 degrees or more to promote ability to look around more comfortably. Baseline:  Active ROM A/PROM (deg) eval  Right rotation 20 degrees with pain  Left rotation 22 degrees with pain   (05/02/2024)  Goal status: INITIAL  5.  Pt will improve B shoulder flexion AROM to 100 degrees or more to promote ability to reach as well as lift more comfortably.  Baseline:  Active ROM Right eval Left eval  Shoulder flexion 55 (scaption) 55 (scaption)   (05/02/2024)  Goal status: INITIAL  6.  Pt will improve his shoulder flexion and lower trap strength by at least 1/2 MMT grade to promote ability to raise his arms as well as lift with less difficulty.  Baseline:  MMT Right eval Left eval  Shoulder flexion 3+ with neck and shoulder and arm pain 4- with pain at available range.   Lower trapezius (seated manually resisted) 3+ with neck pain 3+ with neck pain   (05/02/2024)   Goal status: INITIAL   PLAN:  PT FREQUENCY: 1-2x/week  PT DURATION: 8 weeks  PLANNED INTERVENTIONS: 97110-Therapeutic exercises, 97530- Therapeutic activity, [id number]- Neuromuscular re-education, 97535- Self Care, [id number]- Manual therapy, [id number]- Gait training, [phone]- Electrical stimulation (unattended), [phone]- Ionotophoresis 4mg /ml Dexamethasone , Patient/Family education, and Balance training.  PLAN FOR NEXT SESSION: posture, B scapular, anterior cervical, trunk and glute strengthening, mechanics, manual techniques, modalities PRN   Charliee Krenz, PT, DPT 05/24/2024, 10:42 AM

## 2024-05-26 ENCOUNTER — Ambulatory Visit: Admitting: Student in an Organized Health Care Education/Training Program

## 2024-05-26 ENCOUNTER — Ambulatory Visit

## 2024-05-26 VITALS — BP 146/89 | HR 74 | Temp 98.0°F | Resp 16 | Ht 74.0 in | Wt 221.0 lb

## 2024-05-26 DIAGNOSIS — G894 Chronic pain syndrome: Secondary | ICD-10-CM | POA: Insufficient documentation

## 2024-05-26 DIAGNOSIS — M5412 Radiculopathy, cervical region: Secondary | ICD-10-CM | POA: Diagnosis not present

## 2024-05-26 DIAGNOSIS — M542 Cervicalgia: Secondary | ICD-10-CM

## 2024-05-26 DIAGNOSIS — Z981 Arthrodesis status: Secondary | ICD-10-CM | POA: Diagnosis not present

## 2024-05-26 DIAGNOSIS — F332 Major depressive disorder, recurrent severe without psychotic features: Secondary | ICD-10-CM | POA: Diagnosis not present

## 2024-05-26 NOTE — Patient Instructions (Signed)
 ______________________________________________________________________    General Risks and Possible Complications  Patient Responsibilities: It is important that you read this as it is part of your informed consent. It is our duty to inform you of the risks and possible complications associated with treatments offered to you. It is your responsibility as a patient to read this and to ask questions about anything that is not clear or that you believe was not covered in this document.  Patient's Rights: You have the right to refuse treatment. You also have the right to change your mind, even after initially having agreed to have the treatment done. However, under this last option, if you wait until the last second to change your mind, you may be charged for the materials used up to that point.  Introduction: Medicine is not an Visual merchandiser. Everything in Medicine, including the lack of treatment(s), carries the potential for danger, harm, or loss (which is by definition: Risk). In Medicine, a complication is a secondary problem, condition, or disease that can aggravate an already existing one. All treatments carry the risk of possible complications. The fact that a side effects or complications occurs, does not imply that the treatment was conducted incorrectly. It must be clearly understood that these can happen even when everything is done following the highest safety standards.  No treatment: You can choose not to proceed with the proposed treatment alternative. The "PRO(s)" would include: avoiding the risk of complications associated with the therapy. The "CON(s)" would include: not getting any of the treatment benefits. These benefits fall under one of three categories: diagnostic; therapeutic; and/or palliative. Diagnostic benefits include: getting information which can ultimately lead to improvement of the disease or symptom(s). Therapeutic benefits are those associated with the successful  treatment of the disease. Finally, palliative benefits are those related to the decrease of the primary symptoms, without necessarily curing the condition (example: decreasing the pain from a flare-up of a chronic condition, such as incurable terminal cancer).  General Risks and Complications: These are associated to most interventional treatments. They can occur alone, or in combination. They fall under one of the following six (6) categories: no benefit or worsening of symptoms; bleeding; infection; nerve damage; allergic reactions; and/or death. No benefits or worsening of symptoms: In Medicine there are no guarantees, only probabilities. No healthcare provider can ever guarantee that a medical treatment will work, they can only state the probability that it may. Furthermore, there is always the possibility that the condition may worsen, either directly, or indirectly, as a consequence of the treatment. Bleeding: This is more common if the patient is taking a blood thinner, either prescription or over the counter (example: Goody Powders, Fish oil, Aspirin , Garlic, etc.), or if suffering a condition associated with impaired coagulation (example: Hemophilia, cirrhosis of the liver, low platelet counts, etc.). However, even if you do not have one on these, it can still happen. If you have any of these conditions, or take one of these drugs, make sure to notify your treating physician. Infection: This is more common in patients with a compromised immune system, either due to disease (example: diabetes, cancer, human immunodeficiency virus [HIV], etc.), or due to medications or treatments (example: therapies used to treat cancer and rheumatological diseases). However, even if you do not have one on these, it can still happen. If you have any of these conditions, or take one of these drugs, make sure to notify your treating physician. Nerve Damage: This is more common when the treatment is  an invasive one, but it  can also happen with the use of medications, such as those used in the treatment of cancer. The damage can occur to small secondary nerves, or to large primary ones, such as those in the spinal cord and brain. This damage may be temporary or permanent and it may lead to impairments that can range from temporary numbness to permanent paralysis and/or brain death. Allergic Reactions: Any time a substance or material comes in contact with our body, there is the possibility of an allergic reaction. These can range from a mild skin rash (contact dermatitis) to a severe systemic reaction (anaphylactic reaction), which can result in death. Death: In general, any medical intervention can result in death, most of the time due to an unforeseen complication. ______________________________________________________________________      ______________________________________________________________________    Blood Thinners  IMPORTANT NOTICE:  If you take any of these, make sure to notify the nursing staff.  Failure to do so may result in serious injury.  Recommended time intervals to stop and restart blood-thinners, before & after invasive procedures  Generic Name Brand Name Pre-procedure: Stop medication for this amount of time before your procedure: Post-procedure: Wait this amount of time after the procedure before restarting your medication:  Abciximab Reopro 15 days 2 hrs  Alteplase Activase 10 days 10 days  Anagrelide Agrylin    Apixaban  Eliquis  3 days 6 hrs  Cilostazol Pletal 3 days 5 hrs  Clopidogrel  Plavix  7-10 days 2 hrs  Dabigatran Pradaxa 5 days 6 hrs  Dalteparin Fragmin 24 hours 4 hrs  Dipyridamole Aggrenox 11days 2 hrs  Edoxaban Lixiana; Savaysa 3 days 2 hrs  Enoxaparin   Lovenox  24 hours 4 hrs  Eptifibatide Integrillin 8 hours 2 hrs  Fondaparinux  Arixtra 72 hours 12 hrs  Hydroxychloroquine Plaquenil 11 days   Prasugrel Effient 7-10 days 6 hrs  Reteplase Retavase 10 days 10 days   Rivaroxaban Xarelto 3 days 6 hrs  Ticagrelor Brilinta 5-7 days 6 hrs  Ticlopidine Ticlid 10-14 days 2 hrs  Tinzaparin Innohep 24 hours 4 hrs  Tirofiban Aggrastat 8 hours 2 hrs  Warfarin Coumadin  5 days 2 hrs   Other medications with blood-thinning effects  NOTE: Consider stopping these if you have prolonged bleeding despite not taking any of the above blood thinners. Otherwise ask your provider and this will be decided on a case-by-case basis.  Product indications Generic (Brand) names Note  Cholesterol Lipitor Stop 4 days before procedure  Blood thinner (injectable) Heparin  (LMW or LMWH Heparin ) Stop 24 hours before procedure  Cancer Ibrutinib (Imbruvica) Stop 7 days before procedure  Malaria/Rheumatoid Hydroxychloroquine (Plaquenil) Stop 11 days before procedure  Thrombolytics  10 days before or after procedures   Over-the-counter (OTC) Products with blood-thinning effects  NOTE: Consider stopping these if you have prolonged bleeding despite not taking any of the above blood thinners. Otherwise ask your provider and this will be decided on a case-by-case basis.  Product Common names Stop Time  Aspirin  > 325 mg Goody Powders, Excedrin, etc. 11 days  Aspirin  <= 81 mg  7 days  Fish oil  4 days  Garlic supplements  7 days  Ginkgo biloba  36 hours  Ginseng  24 hours  NSAIDs Ibuprofen, Naprosyn, etc. 3 days  Vitamin E  4 days   ______________________________________________________________________     Epidural Steroid Injection Patient Information  Description: The epidural space surrounds the nerves as they exit the spinal cord.  In some patients, the nerves can be compressed  and inflamed by a bulging disc or a tight spinal canal (spinal stenosis).  By injecting steroids into the epidural space, we can bring irritated nerves into direct contact with a potentially helpful medication.  These steroids act directly on the irritated nerves and can reduce swelling and inflammation which  often leads to decreased pain.  Epidural steroids may be injected anywhere along the spine and from the neck to the low back depending upon the location of your pain.   After numbing the skin with local anesthetic (like Novocaine), a small needle is passed into the epidural space slowly.  You may experience a sensation of pressure while this is being done.  The entire block usually last less than 10 minutes.  Conditions which may be treated by epidural steroids:  Low back and leg pain Neck and arm pain Spinal stenosis Post-laminectomy syndrome Herpes zoster (shingles) pain Pain from compression fractures  Preparation for the injection:  Do not eat any solid food or dairy products within 8 hours of your appointment.  You may drink clear liquids up to 3 hours before appointment.  Clear liquids include water , black coffee, juice or soda.  No milk or cream please. You may take your regular medication, including pain medications, with a sip of water  before your appointment  Diabetics should hold regular insulin  (if taken separately) and take 1/2 normal NPH dos the morning of the procedure.  Carry some sugar containing items with you to your appointment. A driver must accompany you and be prepared to drive you home after your procedure.  Bring all your current medications with your. An IV may be inserted and sedation may be given at the discretion of the physician.   A blood pressure cuff, EKG and other monitors will often be applied during the procedure.  Some patients may need to have extra oxygen administered for a short period. You will be asked to provide medical information, including your allergies, prior to the procedure.  We must know immediately if you are taking blood thinners (like Coumadin Arther Bill)  Or if you are allergic to IV iodine contrast (dye). We must know if you could possible be pregnant.  Possible side-effects: Bleeding from needle site Infection (rare, may require  surgery) Nerve injury (rare) Numbness & tingling (temporary) Difficulty urinating (rare, temporary) Spinal headache ( a headache worse with upright posture) Light -headedness (temporary) Pain at injection site (several days) Decreased blood pressure (temporary) Weakness in arm/leg (temporary) Pressure sensation in back/neck (temporary)  Call if you experience: Fever/chills associated with headache or increased back/neck pain. Headache worsened by an upright position. New onset weakness or numbness of an extremity below the injection site Hives or difficulty breathing (go to the emergency room) Inflammation or drainage at the infection site Severe back/neck pain Any new symptoms which are concerning to you  Please note:  Although the local anesthetic injected can often make your back or neck feel good for several hours after the injection, the pain will likely return.  It takes 3-7 days for steroids to work in the epidural space.  You may not notice any pain relief for at least that one week.  If effective, we will often do a series of three injections spaced 3-6 weeks apart to maximally decrease your pain.  After the initial series, we generally will wait several months before considering a repeat injection of the same type.  If you have any questions, please call [phone] [facility]

## 2024-05-26 NOTE — Progress Notes (Signed)
 PROVIDER NOTE: Interpretation of information contained herein should be left to medically-trained personnel. Specific patient instructions are provided elsewhere under "Patient Instructions" section of medical record. This document was created in part using AI and STT-dictation technology, any transcriptional errors that may result from this process are unintentional.  Patient: Jonathon Snow  Service: E/M Encounter  Provider: Cephus Collin, MD  DOB: 09-02-1961  Delivery: Face-to-face  Specialty: Interventional Pain Management  MRN: [id number]  Setting: Ambulatory outpatient facility  Specialty designation: 09  Type: New Patient  Location: Outpatient office facility  PCP: Adeline Hone, PA-C  DOS: 05/26/2024    Referring Prov.: Jodeen Munch, MD   Primary Reason(s) for Visit: Encounter for initial evaluation of one or more chronic problems (new to examiner) potentially causing chronic pain, and posing a threat to normal musculoskeletal function. (Level of risk: High) CC: Neck Pain  HPI  Jonathon Snow is a 63 y.o. year old, male patient, who comes for the first time to our practice referred by Jodeen Munch, MD for our initial evaluation of his chronic pain. 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); Syncope and collapse; Chronic venous insufficiency; Lymphedema; Osteoporosis; SLE glomerulonephritis syndrome, 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HCC); Other spondylosis with radiculopathy, lumbar region; MDD (major depressive disorder), recurrent episode, mild (HCC); Anticoagulant disorder (HCC); Senile purpura (HCC); Prediabetes; Stage 3a chronic kidney disease  (HCC); Seasonal allergies; Rhinosinusitis; 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); Varicose 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; Coronary artery disease involving native coronary artery of native heart without angina pectoris; Erectile disorder; Hx of colonic polyps; Adenomatous polyp of colon; Chronic bilateral low back pain with left-sided sciatica; Neuropathy; Closed T10 spinal fracture (HCC); Closed tricolumnar fracture of thoracic vertebra (HCC); Thoracic spine instability; Unstable angina (HCC); Overweight (BMI 25.0-29.9); Chronic heart failure with preserved ejection fraction (HFpEF) (HCC); BPH (benign prostatic hyperplasia); Anxiety; 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 Today he comes in for evaluation of his Neck Pain  Pain Assessment: Location: Right, Left Shoulder (Back pain- mid lower 7/10 radiates down left leg to side of foot affects all toes) Radiating: up back of neck and shoulders bilateral will radiate to biceps bilaterally and sometimes to right hand, right side worse Onset: More than a month ago Duration: Chronic pain Quality: Aching, Constant, Cramping, Discomfort, Nagging, Crying, Shooting Severity: 7 /10 (subjective, self-reported pain score)  Effect on ADL: Prolonged walking and standing, getting dressed, ADL'S,driving, "Anthing I heart" Timing: Constant Modifying factors: tramadol  just increased, tried heat and cold, res BP: (!) 146/89  HR: 74  Onset and Duration: Present longer than 3 months Cause of pain: fall Severity: No change since onset Timing: Not influenced by the time of the day, During activity or exercise, After activity or  exercise, and After a period of immobility Aggravating Factors: Lifiting, Motion, Prolonged sitting, and Prolonged standing Alleviating Factors: Resting Associated Problems: Depression, Dizziness, Fatigue, Inability to concentrate, Numbness, Swelling, Weakness, Pain that wakes patient up, and Pain that does not allow patient  to sleep Quality of Pain: Aching, Burning, Constant, Sharp, and Stabbing Previous Examinations or Tests: Bone scan, CT scan, and MRI scan Previous Treatments: Physical Therapy  Jonathon Snow is being evaluated for possible interventional pain management therapies for the treatment of his chronic pain.   Discussed the use of AI scribe software for clinical note transcription with the patient, who gave verbal consent to proceed.  History of Present Illness   Jonathon Snow is a 63 year old male who presents with neck and shoulder pain. He was referred by Dr. Jeris Montes for evaluation of neck and shoulder pain.  He experiences significant pain in his neck and shoulders, which are the most painful areas, followed by his lower back. The neck and shoulder pain began after his upper back surgery, a fusion from T7 to T12 performed in November 2024. The pain radiates into his shoulders, arms, and fingers, with the right side being much worse than the left. He also has difficulty with fine motor tasks such as opening jars and bottles due to decreased grip strength.  He has a history of two spinal fusions: a lumbar fusion at L5-S1 in June 2022 following a fall that resulted in a broken lower back, and a thoracic fusion from T7 to T12 in November 2024 after a fall that caused fractures in that region. Since the surgeries, he has experienced issues with standing and mobility, and his blood pressure occasionally drops, leading to near-fainting episodes. He has not had any falls since March 2025 but has had to sit or lie down to prevent passing out.  Recent imaging, specifically an MRI of the  cervical spine, shows narrowing and compression at C4-C5, C5-C6, and C6-C7.  No recent falls since March 2025 but episodes of near-fainting due to low blood pressure. Difficulty with fine motor control, particularly in gripping objects.      He is currently working with physical therapy.  Historic Controlled Substance Pharmacotherapy Review   Historical Monitoring: The patient  reports no history of drug use. List of prior UDS Testing: Lab Results  Component Value Date   MDMA NONE DETECTED 02/21/2018   COCAINSCRNUR NONE DETECTED 02/21/2018   COCAINSCRNUR NONE DETECTED 12/10/2013   PCPSCRNUR NONE DETECTED 02/21/2018   THCU NONE DETECTED 02/21/2018   THCU NONE DETECTED 12/10/2013   ETH <10 02/21/2018   ETH <11 12/09/2013   Historical Background Evaluation: [place] PMP: PDMP not reviewed this encounter. Review of the past 60-months conducted.              [place] Department of public safety, offender search: Engineer, mining Information) Non-contributory Risk Assessment Profile: Aberrant behavior: None observed or detected today Risk factors for fatal opioid overdose: None identified today Fatal overdose hazard ratio (HR): Calculation deferred Non-fatal overdose hazard ratio (HR): Calculation deferred Risk of opioid abuse or dependence: 0.7-3.0% with doses <= 36 MME/day and 6.1-26% with doses >= 120 MME/day. Substance use disorder (SUD) risk level: See below Personal History of Substance Abuse (SUD-Substance use disorder):  Alcohol: Negative  Illegal Drugs: Negative  Rx Drugs: Negative  ORT Risk Level calculation: Low Risk  Opioid Risk Tool - 05/26/24 1011       Family History of Substance Abuse   Alcohol Negative    Illegal Drugs Negative    Rx Drugs Negative      Personal History of Substance Abuse   Alcohol Negative    Illegal Drugs Negative    Rx Drugs Negative      Psychological Disease  Psychological Disease Positive    ADD Negative    OCD Negative    Bipolar Negative     Schizophrenia Negative    Depression Positive      Total Score   Opioid Risk Tool Scoring 3    Opioid Risk Interpretation Low Risk            ORT Scoring interpretation table:  Score <3 = Low Risk for SUD  Score between 4-7 = Moderate Risk for SUD  Score >8 = High Risk for Opioid Abuse     Pharmacologic Plan: As per protocol, I have not taken over any controlled substance management, pending the results of ordered tests and/or consults.              Meds   Current Outpatient Medications:    amLODipine  (NORVASC ) 10 MG tablet, Take by mouth., Disp: , Rfl:    apixaban  (ELIQUIS ) 5 MG TABS tablet, Take 1 tablet (5 mg total) by mouth 2 (two) times daily., Disp: 180 tablet, Rfl: 1   ARIPiprazole  (ABILIFY ) 2 MG tablet, Take 4 mg by mouth daily., Disp: , Rfl:    cholecalciferol  (VITAMIN D3) 25 MCG (1000 UNIT) tablet, Take 1,000 Units by mouth daily., Disp: , Rfl:    clonazePAM  (KLONOPIN ) 0.5 MG tablet, Take 0.5 mg by mouth daily., Disp: , Rfl:    clopidogrel  (PLAVIX ) 75 MG tablet, TAKE 1 TABLET(75 MG) BY MOUTH DAILY, Disp: 90 tablet, Rfl: 0   cyanocobalamin  (VITAMIN B12) 1000 MCG/ML injection, Vit B12 1000 mcg IM once a week for 3 weeks then once a month for 4 months. During and after loading dose with injection treatment patient should also take 1000 mcg by mouth once a day., Disp: , Rfl:    fluticasone  (FLONASE ) 50 MCG/ACT nasal spray, Place 2 sprays into both nostrils daily., Disp: 16 g, Rfl: 6   hydrALAZINE  (APRESOLINE ) 25 MG tablet, Take 1 tablet (25 mg total) by mouth 3 (three) times daily., Disp: 270 tablet, Rfl: 1   hydroxychloroquine (PLAQUENIL) 200 MG tablet, Take 200 mg by mouth 2 (two) times daily., Disp: , Rfl:    hydrOXYzine  (ATARAX ) 10 MG tablet, Take 1 tablet (10 mg total) by mouth every 8 (eight) hours as needed for itching., Disp: 30 tablet, Rfl: 0   levocetirizine (XYZAL ) 5 MG tablet, Take 1 tablet (5 mg total) by mouth every evening., Disp: 30 tablet, Rfl: 5    loratadine  (CLARITIN ) 10 MG tablet, Take 10 mg by mouth daily., Disp: , Rfl:    losartan  (COZAAR ) 50 MG tablet, Take 1 tablet (50 mg total) by mouth daily., Disp: 90 tablet, Rfl: 3   meclizine  (ANTIVERT ) 12.5 MG tablet, Take 1 tablet (12.5 mg total) by mouth 3 (three) times daily as needed for dizziness., Disp: 30 tablet, Rfl: 0   metoprolol  succinate (TOPROL -XL) 25 MG 24 hr tablet, Take 1 tablet (25 mg total) by mouth daily., Disp: 90 tablet, Rfl: 3   montelukast  (SINGULAIR ) 10 MG tablet, Take 1 tablet (10 mg total) by mouth at bedtime., Disp: 90 tablet, Rfl: 1   Mouthwashes (BIOTENE DRY MOUTH GENTLE) LIQD, Use as directed 15 mLs in the mouth or throat 3 (three) times daily as needed (for dry mouth)., Disp: 473 mL, Rfl: 11   Multiple Vitamins-Minerals (MULTIVITAMIN WITH MINERALS) tablet, Take 1 tablet by mouth daily., Disp: , Rfl:    mycophenolate  (CELLCEPT ) 500 MG tablet, Take 2 tablets (1,000 mg total) by mouth 2 (two) times daily. HOLD until neurosurgery is ok  to restart this (waiting for incision to heal) (Patient taking differently: Take 500 mg by mouth 2 (two) times daily.), Disp: , Rfl:    oxybutynin  (DITROPAN  XL) 15 MG 24 hr tablet, Take 15 mg by mouth daily., Disp: , Rfl:    pantoprazole  (PROTONIX ) 20 MG tablet, Take 1 tablet (20 mg total) by mouth 2 (two) times daily., Disp: 180 tablet, Rfl: 3   rosuvastatin  (CRESTOR ) 20 MG tablet, Take 1 tablet (20 mg total) by mouth daily., Disp: 90 tablet, Rfl: 1   sertraline  (ZOLOFT ) 100 MG tablet, Take 1.5 tablets (150 mg total) by mouth daily., Disp: 135 tablet, Rfl: 1   solifenacin  (VESICARE ) 5 MG tablet, Take 5 mg by mouth daily., Disp: , Rfl:    Syringe/Needle, Disp, (SYRINGE 3CC/25GX1") 25G X 1" 3 ML MISC, To be used with Vit B12 1000 mcg IM once a week for 4 weeks then once a month for 4 months. During and after loading dose with injection treatment patient should also take 1000 mcg by mouth once a day., Disp: , Rfl:    tadalafil  (CIALIS ) 20 MG  tablet, Take 1 tablet (20 mg total) by mouth daily as needed for erectile dysfunction. Avoid taking it if low blood pressure, Disp: , Rfl:    tamsulosin  (FLOMAX ) 0.4 MG CAPS capsule, Take 1 capsule (0.4 mg total) by mouth daily., Disp: 30 capsule, Rfl: 11   traMADol  (ULTRAM ) 50 MG tablet, SMARTSIG:1 Tablet(s) By Mouth Every 12 Hours, Disp: , Rfl:    carbidopa-levodopa (SINEMET IR) 25-100 MG tablet, Take by mouth., Disp: , Rfl:    gabapentin  (NEURONTIN ) 100 MG capsule, Take 100 mg by mouth 3 (three) times daily. (Patient not taking: Reported on 05/26/2024), Disp: , Rfl:   Imaging Review  Cervical Imaging: Cervical MR wo contrast: Results for orders placed during the hospital encounter of 02/25/24  MR CERVICAL SPINE WO CONTRAST  Narrative CLINICAL DATA:  Myelopathy, acute, cervical spine  EXAM: MRI CERVICAL SPINE WITHOUT CONTRAST  TECHNIQUE: Multiplanar, multisequence MR imaging of the cervical spine was performed. No intravenous contrast was administered.  COMPARISON:  CT of the cervical spine November 05, 2023.  FINDINGS: Alignment: No substantial sagittal subluxation.  Vertebrae: No evidence of acute fracture, evidence of discitis, or suspicious bone lesion. Benign vertebral venous malformation at T3. Mild T1-T2 height loss, unchanged.  Cord: Normal cord signal.  Neck  Posterior Fossa, vertebral arteries, paraspinal tissues: Visualized vertebral artery flow voids are maintained. No paraspinal edema.  Disc levels:  C2-C3: No significant disc protrusion, foraminal stenosis, or canal stenosis.  C3-C4: No significant disc protrusion, foraminal stenosis, or canal stenosis.  C4-C5: Right eccentric posterior disc osteophyte complex with right greater than left facet and uncovertebral hypertrophy. Resulting mild right greater than left foraminal stenosis. Mild canal stenosis.  C5-C6: Posterior disc osteophyte complex ligamentum flavum thickening and right greater than left  facet and uncovertebral hypertrophy. Resulting mild-to-moderate bilateral foraminal stenosis. Mild to moderate canal stenosis.  C6-C7: Posterior disc osteophyte complex with bilateral facet and uncovertebral hypertrophy. Resulting moderate bilateral foraminal stenosis. Mild canal stenosis.  C7-T1: No significant disc protrusion, foraminal stenosis, or canal stenosis.  IMPRESSION: 1. 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 2. At C5-C6, mild to moderate canal and bilateral foraminal stenosis. 3. At C4-C5, mild canal and bilateral foraminal stenosis.   Electronically Signed By: Stevenson Elbe M.D. On: 02/27/2024 02:42   Narrative CLINICAL DATA:  Trauma syncope  EXAM: CT CERVICAL SPINE WITHOUT CONTRAST  TECHNIQUE: Multidetector  CT imaging of the cervical spine was performed without intravenous contrast. Multiplanar CT image reconstructions were also generated.  RADIATION DOSE REDUCTION: This exam was performed according to the departmental dose-optimization program which includes automated exposure control, adjustment of the mA and/or kV according to patient size and/or use of iterative reconstruction technique.  COMPARISON:  CT 11/05/2023  FINDINGS: Alignment: Stable alignment. No subluxation. Facet alignment is normal  Skull base and vertebrae: Mild chronic compression deformities at T1, T2 and T3. No acute fracture is seen.  Soft tissues and spinal canal: No prevertebral fluid or swelling. No visible canal hematoma.  Disc levels:  Moderate multilevel degenerative changes.  Upper chest: Multiple punctate parotid calcifications with atrophy of the salivary gland, correlate for any history of inflammatory or autoimmune disease. Lung apices are clear  Other: None  IMPRESSION: Degenerative changes.  No acute osseous abnormality.   Electronically Signed By: Esmeralda Hedge M.D. On: 02/25/2024 22:54   MR THORACIC SPINE WO  CONTRAST  Narrative CLINICAL DATA:  Initial evaluation for acute mid back pain.  EXAM: MRI THORACIC SPINE WITHOUT CONTRAST  TECHNIQUE: Multiplanar, multisequence MR imaging of the thoracic spine was performed. No intravenous contrast was administered.  COMPARISON:  Prior CT from earlier the same day.  FINDINGS: Alignment: Physiologic with preservation of the normal thoracic kyphosis. No listhesis.  Vertebrae: Acute transverse fracture extending through the T10 vertebral body again seen. Evidence for extension through the posterior elements on the right (series 19, image 6). Findings consistent with an acute Chance type fracture, and unstable injury by definition. No significant vertebral body height loss. Probable injury/disruption of the anterior longitudinal ligament at this level. Remainder of the major ligamentous structures appear grossly intact.  No other acute or recent fracture within the thoracic spine. Mild chronic compression deformity of L1 noted. Bone marrow signal intensity diffusely heterogeneous but overall within normal limits. Few scattered benign hemangiomata noted. No worrisome osseous lesions. No other abnormal marrow edema.  Cord: Normal signal and morphology. No evidence for traumatic cord injury.  Paraspinal and other soft tissues: Paraspinous soft tissues demonstrate no acute finding. Small layering bilateral pleural effusions. Complex cystic lesion within the partially visualized right hepatic lobe noted, better seen on prior CT. 1.6 cm simple right renal cyst, benign in appearance, no follow-up imaging recommended.  Disc levels:  Ordinary for age multilevel disc desiccation seen throughout the thoracic spine. No significant disc bulge or focal disc herniation. Mild multilevel facet degeneration. No spinal stenosis. Neural foramina remain patent.  IMPRESSION: 1. Acute transverse fracture extending through the T10 vertebral body, with  extension through the posterior elements on the right. Finding consistent with an acute "Chance" type fracture, an unstable injury by definition. No significant vertebral body height loss. Probable injury/disruption of the anterior longitudinal ligament at this level. 2. No other acute traumatic injury within the thoracic spine or spinal cord. 3. Small layering bilateral pleural effusions.   Electronically Signed By: Virgia Griffins M.D. On: 11/06/2023 00:57    Narrative CLINICAL DATA:  Post fusion  EXAM: THORACIC SPINE 2 VIEWS  COMPARISON:  January 28, 2024  FINDINGS: No change in the previously described thoracic fusion from T7 through T12 with augmentation of T7-T8 T11 and T12. Pedicle screws and posterior rods are unchanged. No evidence of hardware failure. Near anatomic alignment. No new abnormality.  IMPRESSION: No change in the previously described thoracic fusion from T7 through T12 with augmentation of T7-T8 T11 and T12.   Electronically Signed By: Fredrich Jefferson  M.D. On: 05/05/2024 08:22  DG Lumbar Spine Complete  Narrative CLINICAL DATA:  Fall several months ago with low back pain, initial encounter  EXAM: LUMBAR SPINE - COMPLETE 4+ VIEW  COMPARISON:  03/17/2018  FINDINGS: Five lumbar type vertebral bodies are well visualized. Vertebral body height is well maintained. Stable compression deformity at L1 is noted with osteophytic change. No new compression deformity is seen. No anterolisthesis is noted. No pars defects are seen. Facet hypertrophic changes are noted. Ingested tablets are identified as is an IVC filter. No acute abnormality is noted.  IMPRESSION: Mild degenerative change without acute abnormality.   Electronically Signed By: Violeta Grey M.D. On: 02/15/2019 16:00  MR HIP LEFT WO CONTRAST  Narrative CLINICAL DATA:  Left hip pain, left femoral neck fracture in late November with subsequent percutaneous  pinning.  EXAM: MR OF THE LEFT HIP WITHOUT CONTRAST  TECHNIQUE: Multiplanar, multisequence MR imaging was performed. No intravenous contrast was administered.  COMPARISON:  02/18/2023 and 02/19/2023 CT  FINDINGS: Bones: Metal artifact from the pins obscures the left femoral head, femoral neck, greater trochanter, and acetabulum. No significant regional marrow edema on STIR images.  Prior compression fracture at L4 with vertebral augmentation. Metal hardware from prior fusion at L5-S1.  Articular cartilage and labrum  Articular cartilage:  Obscured by metal artifact  Labrum:  Obscured by metal artifact  Joint or bursal effusion  Joint effusion:  Absent  Bursae: Mild trochanteric bursitis particularly posteriorly adjacent to the distal gluteus medius tendon for example on image 16 series 8.  Muscles and tendons  Muscles and tendons:  Unremarkable  Other findings  Miscellaneous: No sciatic notch impingement. Sigmoid colon diverticulosis.  IMPRESSION: 1. Metal artifact from the pins obscures the left femoral head, femoral neck, greater trochanter, and acetabulum. No significant regional marrow edema on STIR images. 2. Mild left trochanteric bursitis particularly posteriorly adjacent to the distal gluteus medius tendon. 3. Prior compression fracture at L4 with vertebral augmentation. Metal hardware from prior fusion at L5-S1. 4. Sigmoid colon diverticulosis.   Electronically Signed By: Freida Jes M.D. On: 10/17/2023 13:31   CT Hip Left Wo Contrast  Narrative CLINICAL DATA:  Trauma  EXAM: CT OF THE LEFT HIP WITHOUT CONTRAST  TECHNIQUE: Multidetector CT imaging of the left hip was performed according to the standard protocol. Multiplanar CT image reconstructions were also generated.  RADIATION DOSE REDUCTION: This exam was performed according to the departmental dose-optimization program which includes automated exposure control, adjustment of  the mA and/or kV according to patient size and/or use of iterative reconstruction technique.  COMPARISON:  X-ray same day  FINDINGS: Bones/Joint/Cartilage  The bones are osteopenic. There is subtle cortical step-off at the femoral head neck junction laterally and anteriorly suspicious for nondisplaced subcapital fracture. No dislocation. There are mild degenerative changes of the left hip with joint space narrowing and osteophyte formation.  Ligaments  Suboptimally assessed by CT.  Muscles and Tendons  Negative.  Soft tissues  No focal hematoma.  There is sigmoid colon diverticulosis.  IMPRESSION: 1. Subtle nondisplaced subcapital left femoral neck fracture. 2. Osteopenia. 3. Mild degenerative changes of the left hip. 4. Sigmoid colon diverticulosis.   Electronically Signed By: Tyron Gallon M.D. On: 02/18/2023 20:53   Narrative CLINICAL DATA:  Fluoro guidance provided  EXAM: DG HIP (WITH OR WITHOUT PELVIS) 2-3V LEFT; DG C-ARM 1-60 MIN-NO REPORT  FINDINGS: Dose:  36.7 mGy  Fluoro time 78s  IMPRESSION: C-arm fluoro guidance provided.   Electronically Signed By: Melodie Spry  Pleasure M.D. On: 02/19/2023 13:45  DG Foot Complete Right  Narrative CLINICAL DATA:  Right heel pain for 2 months, no known injury, initial encounter  EXAM: RIGHT FOOT COMPLETE - 3+ VIEW  COMPARISON:  02/26/2016  FINDINGS: Degenerative changes are noted in the interphalangeal joints as well as at the tarsal metatarsal articulation. Changes of prior surgical fusion in the distal tibia and fibula are noted. Calcaneal spurring is seen. No acute fracture is noted.  IMPRESSION: Chronic changes without acute abnormality.   Electronically Signed By: Violeta Grey M.D. On: 02/15/2019 15:53  Foot-L DG Complete: Results for orders placed during the hospital encounter of 02/15/19  DG Foot Complete Left  Narrative CLINICAL DATA:  Left heel pain for the past 2 months. No  known injury.  EXAM: LEFT FOOT - COMPLETE 3+ VIEW  COMPARISON:  None.  FINDINGS: Moderate-sized inferior and posterior calcaneal spurs. Otherwise, unremarkable bones and soft tissues.  IMPRESSION: Moderate-sized calcaneal spurs. Otherwise, unremarkable examination.   Electronically Signed By: Catherin Closs M.D. On: 02/15/2019 15:53   DG Hand Complete Right  Narrative CLINICAL DATA:  Fall with fourth digit pain  EXAM: RIGHT HAND - COMPLETE 3+ VIEW  COMPARISON:  None Available.  FINDINGS: No malalignment. Chronic appearing fracture deformity of the fifth metacarpal. Soft tissues are unremarkable  IMPRESSION: No acute osseous abnormality. Chronic appearing fifth metacarpal fracture   Electronically Signed By: Esmeralda Hedge M.D. On: 02/25/2024 23:12    Complexity Note: Imaging results reviewed.                         ROS  Cardiovascular: Daily Aspirin  intake, Chest pain, Heart attack ( Date:  ), and Blood thinners:  Anticoagulant Pulmonary or Respiratory: Wheezing and difficulty taking a deep full breath (Asthma) and Shortness of breath Neurological: Curved spine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

## 2024-05-26 NOTE — Progress Notes (Signed)
 Safety precautions to be maintained throughout the outpatient stay will include: orient to surroundings, keep bed in low position, maintain call bell within reach at all times, provide assistance with transfer out of bed and ambulation.

## 2024-05-26 NOTE — Therapy (Signed)
 OUTPATIENT PHYSICAL THERAPY Treatment   Patient Name: Jonathon Snow MRN: [id number] DOB:05-21-1961, 63 y.o., male Today's Date: 05/26/2024  END OF SESSION:  PT End of Session - 05/26/24 0814     Visit Number 7    Number of Visits 17    Date for PT Re-Evaluation 07/01/24    PT Start Time 0814    PT Stop Time 0901    PT Time Calculation (min) 47 min    Activity Tolerance Patient tolerated treatment well;Patient limited by pain    Behavior During Therapy [facility] for tasks assessed/performed                   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   Overview:  superior sagittal sinus, left transverse sinus and cortical veins    Closed left hip fracture, initial encounter (HCC) 02/18/2023   COVID-19 virus infection 12/2020   Depression    DVT (deep venous thrombosis) (HCC)    Dyspnea    GERD (gastroesophageal reflux disease)    Head injury 11/05/2023   Heart attack (HCC) 11/05/2023   Heel spur, left 02/16/2019   Heel spur, right 02/16/2019   Hematoma of groin 11/08/2023   Herpes zoster infection of lumbar region 02/20/2020   History of DVT (deep vein thrombosis) 03/22/2020   History of kidney stones    Hyperlipidemia    Hypertension    Long term current use of systemic steroids 11/08/2020   Lupus    Lymphedema 10/07/2018   Morbid obesity (HCC)    Opiate abuse, episodic (HCC) 02/26/2018   Osteoporosis    Pain and swelling of right lower extremity 02/03/2023   Pneumonia    PONV (postoperative nausea and vomiting)    Postphlebitic syndrome with ulcer, 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   Presence of IVC filter 03/22/2020   Removed   Pulmonary embolism (HCC)    Renal disorder    Stage III   Severe episode of recurrent major depressive disorder,  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   Shock circulatory (HCC) 11/06/2023   STEMI (ST elevation myocardial infarction) (HCC) 11/05/2023   Past Surgical History:  Procedure Laterality Date   ANKLE SURGERY Right    BACK SURGERY     BRONCHIAL WASHINGS N/A 11/01/2021   Procedure: BRONCHIAL WASHINGS;  Surgeon: Erskin Hearing, MD;  Location: [facility] ORS;  Service: Thoracic;  Laterality: N/A;   COLONOSCOPY WITH PROPOFOL  N/A 05/28/2020   Procedure: COLONOSCOPY WITH PROPOFOL ;  Surgeon: Luke Salaam, MD;  Location: [facility] ENDOSCOPY;  Service: Endoscopy;  Laterality: N/A;   COLONOSCOPY WITH PROPOFOL  N/A 07/01/2023   Procedure: COLONOSCOPY WITH PROPOFOL ;  Surgeon: Luke Salaam, MD;  Location: [facility] ENDOSCOPY;  Service: Gastroenterology;  Laterality: N/A;   CORONARY ULTRASOUND/IVUS N/A 11/05/2023   Procedure: Coronary Ultrasound/IVUS;  Surgeon: Sammy Crisp, MD;  Location: [facility] INVASIVE CV LAB;  Service: Cardiovascular;  Laterality: N/A;   CORONARY/GRAFT ACUTE MI REVASCULARIZATION N/A 11/05/2023   Procedure: Coronary/Graft Acute MI Revascularization;  Surgeon: Sammy Crisp, MD;  Location: [facility] INVASIVE CV LAB;  Service: Cardiovascular;  Laterality: N/A;   CYST EXCISION  92 or 93    Liver cyst removal [facility]   FLEXIBLE BRONCHOSCOPY N/A 11/01/2021   Procedure: FLEXIBLE BRONCHOSCOPY;  Surgeon: Erskin Hearing, MD;  Location: [facility] ORS;  Service: Thoracic;  Laterality: N/A;  HIP PINNING,CANNULATED Left 02/19/2023   Procedure: PERCUTANEOUS FIXATION OF FEMORAL NECK;  Surgeon: Lorri Rota, MD;  Location: [facility] ORS;  Service: Orthopedics;  Laterality: Left;   I & D EXTREMITY Right 04/29/2017   Procedure: IRRIGATION AND DEBRIDEMENT EXTREMITY;  Surgeon: Gwyndolyn Lerner, MD;  Location: [facility] ORS;  Service: General;  Laterality: Right;   IRRIGATION AND DEBRIDEMENT ABSCESS Left 04/29/2017   Procedure: IRRIGATION AND DEBRIDEMENT Scrotal ABSCESS;  Surgeon: Gwyndolyn Lerner, MD;  Location: [facility] ORS;  Service:  General;  Laterality: Left;   LEFT HEART CATH AND CORONARY ANGIOGRAPHY N/A 11/05/2023   Procedure: LEFT HEART CATH AND CORONARY ANGIOGRAPHY;  Surgeon: Sammy Crisp, MD;  Location: [facility] INVASIVE CV LAB;  Service: Cardiovascular;  Laterality: N/A;   POLYPECTOMY  07/01/2023   Procedure: POLYPECTOMY;  Surgeon: Luke Salaam, MD;  Location: [facility] ENDOSCOPY;  Service: Gastroenterology;;   Patient Active Problem List   Diagnosis Date Noted   Anxiety and depression 02/26/2024   Dyslipidemia 02/26/2024   Peripheral neuropathy 02/26/2024   Essential hypertension 02/26/2024   Vertigo 02/26/2024   Syncope 02/25/2024   Orthostatic hypotension 02/11/2024   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) 01/20/2024   Anxiety 12/14/2023   Overweight (BMI 25.0-29.9) 11/23/2023   Chronic heart failure with preserved ejection fraction (HFpEF) (HCC) 11/23/2023   BPH (benign prostatic hyperplasia) 11/23/2023   Unstable angina (HCC) 11/10/2023   Closed T10 spinal fracture (HCC) 11/06/2023   Closed tricolumnar fracture of thoracic vertebra (HCC) 11/06/2023   Thoracic spine instability 11/06/2023   Chronic bilateral low back pain with left-sided sciatica 07/07/2023   Neuropathy 07/07/2023   Hx of colonic polyps 07/01/2023   Adenomatous polyp of colon 07/01/2023   Erectile disorder 01/22/2023   Coronary artery disease involving native coronary artery of native heart without angina pectoris 09/09/2022   Varicose 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 01/03/2022   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) 09/18/2021   Prediabetes 11/08/2020   Stage 3a chronic kidney disease (HCC) 11/08/2020   Seasonal allergies 11/08/2020   Rhinosinusitis 11/08/2020   Anticoagulant disorder (HCC) 11/07/2020   Senile purpura (HCC) 11/07/2020   MDD (major depressive disorder), recurrent episode, mild (HCC) 09/12/2019   Other spondylosis with radiculopathy, lumbar region 02/16/2019    Osteoporosis 10/12/2018   Chronic venous insufficiency 10/07/2018   Lymphedema 10/07/2018   SLE glomerulonephritis syndrome, 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HCC) 03/03/2018   Syncope and collapse 12/29/2017   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) 10/29/2016   Hyperlipidemia LDL goal <70 01/15/2016   Uncontrolled hypertension 01/15/2016   Obesity (BMI 30.0-34.9) 01/15/2016   Long term current use of anticoagulant 10/04/2015   Systemic lupus erythematosus (HCC) 05/30/2015   COPD, moderate (HCC) 06/27/2014   Shortness of breath 06/27/2014   History of pulmonary embolism 12/11/2013   Nodule of right lung 12/11/2013   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   Cystic disease of liver 08/21/2013    PCP: Adeline Hone, PA-C   REFERRING PROVIDER: Jodeen Munch, MD  REFERRING DIAG: [phone] (ICD-10-CM) - Cervical radiculopathy M54.50,G89.29 (ICD-10-CM) - Chronic bilateral low back pain without sciatica  Rationale for Evaluation and Treatment: Rehabilitation  THERAPY DIAG:  Cervicalgia  Radiculopathy, cervical region  ONSET DATE: November 2024  SUBJECTIVE:  SUBJECTIVE STATEMENT: Neck is about the same. Woke up this morning again from his neck and UE pain. 5/10 neck pain, 7/10 R shoulder pain currently (R median and radial nerve distribution to hand)        PERTINENT HISTORY:  Cervicalgia, low back pain. Pt also reports R > L C5/C6 dermatome to elbow bilaterally. Pt has a difficult time turning his head and looking up. No neck surgery. Had Thoracic and lumbar spine fusion surgery. Had mid back surgery (thoracic) November 2024 due to his fall. Neck pain is more recent. Neck and shoulder pain began November 2024 after his thoracic surgery. Usually sleeps on  his L side but is currently difficulty to do secondary to B shoulder and neck pain, so pt is not sleeping well. Neck pain is the same since November 2024. Pt tends to have issues getting dizzy and passes out when he is standing and exerting himself  Vision gets blurry and splotchy. Room feels like it is waving around, pt is not walking on a stable surface, feels like he is walking on a boat and not having sea legs.   The IVC filter was removed.  Still has L LE DVT. Pt states taking Elequis. L LE DVT is still being treated.    PAIN:  Are you having pain? Yes: NPRS scale: /10 currently Pain location: neck (cervical paraspinals and upper trap) and B C5/C6 area to elbow Pain description: really really sore Aggravating factors: raising his arms up such as to reach, lifting with his arm, turning his head, looking up.  Relieving factors: Laying flat on his back on his bed with a pillow.    Pt also gets a stinging sensation at mid back around the T7 and above area, not all the time   Are you having pain? Yes: NPRS scale: /10 currently (pt sitting on a chair) Pain location: L low back pain; L L4 dermatome radiating symptoms to big toe.  Pain description: stinging, burning, ache Aggravating factors: sitting for long periods, about 15-20 minutes, trying to move around, bending lifting,   Relieving factors: Laying flat on his back, on the bed  PRECAUTIONS: dizzy and passes out when he is standing and exerting himself  L LE DVT, fall risk  RED FLAGS: Bowel or bladder incontinence: No and Cauda equina syndrome: No   WEIGHT BEARING RESTRICTIONS: No  FALLS:  Has patient fallen in last 6 months? Yes. Number of falls 2 (original fall was when pt had a heart attack November 2024 and hurt his back. February 22, 2024, pt passed out and fell into the bath tub.   LIVING ENVIRONMENT: Lives with: lives alone Lives in: House/apartment Stairs: No Has following equipment at home: Single point cane and  Environmental consultant - 2 wheeled  OCCUPATION: Not currently working  PLOF: Needs assistance with homemaking   PATIENT GOALS: be able to do everything better.   NEXT MD VISIT: Yes but does not know when.   OBJECTIVE:  Note: Objective measures were completed at Evaluation unless otherwise noted.  DIAGNOSTIC FINDINGS:  MR CERVICAL SPINE WO CONTRAST 02/26/2024  Narrative & Impression  CLINICAL DATA:  Myelopathy, acute, cervical spine   EXAM: MRI CERVICAL SPINE WITHOUT CONTRAST   TECHNIQUE: Multiplanar, multisequence MR imaging of the cervical spine was performed. No intravenous contrast was administered.   COMPARISON:  CT of the cervical spine November 05, 2023.   FINDINGS: Alignment: No substantial sagittal subluxation.   Vertebrae: No evidence of acute fracture, evidence of discitis, or  suspicious bone lesion. Benign vertebral venous malformation at T3. Mild T1-T2 height loss, unchanged.   Cord: Normal cord signal.  Neck   Posterior Fossa, vertebral arteries, paraspinal tissues: Visualized vertebral artery flow voids are maintained. No paraspinal edema.   Disc levels:   C2-C3: No significant disc protrusion, foraminal stenosis, or canal stenosis.   C3-C4: No significant disc protrusion, foraminal stenosis, or canal stenosis.   C4-C5: Right eccentric posterior disc osteophyte complex with right greater than left facet and uncovertebral hypertrophy. Resulting mild right greater than left foraminal stenosis. Mild canal stenosis.   C5-C6: Posterior disc osteophyte complex ligamentum flavum thickening and right greater than left facet and uncovertebral hypertrophy. Resulting mild-to-moderate bilateral foraminal stenosis. Mild to moderate canal stenosis.   C6-C7: Posterior disc osteophyte complex with bilateral facet and uncovertebral hypertrophy. Resulting moderate bilateral foraminal stenosis. Mild canal stenosis.   C7-T1: No significant disc protrusion, foraminal stenosis, or  canal stenosis.   IMPRESSION: 1. 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 2. At C5-C6, mild to moderate canal and bilateral foraminal stenosis. 3. At C4-C5, mild canal and bilateral foraminal stenosis.     Electronically Signed   By: Stevenson Elbe M.D.   On: 02/27/2024 02:42         PATIENT SURVEYS:  Neck Disability Index score: 41/50 (82%) (05/02/2024)  COGNITION: Overall cognitive status: Within functional limits for tasks assessed     SENSATION:   MUSCLE LENGTH:   POSTURE:   PALPATION: TTP to posterior neck   CERVICAL ROM:   Active ROM A/PROM (deg) eval  Flexion WFL with posterior neck pain  Extension Very limited with pain, movement preference around C5/C6 area  Right lateral flexion Very limited with pain  Left lateral flexion Very limited with pain  Right rotation 20 degrees with pain  Left rotation 22 degrees with pain   (Blank rows = not tested)  UPPER EXTREMITY MMT:  MMT Right eval Left eval  Shoulder flexion 3+ with neck and shoulder and arm pain 4- with pain at available range.   Shoulder extension    Shoulder abduction    Shoulder adduction    Shoulder extension    Shoulder internal rotation    Shoulder external rotation    Middle trapezius    Lower trapezius (seated manually resisted) 3+ with neck pain 3+ with neck pain  Elbow flexion    Elbow extension    Wrist flexion    Wrist extension    Wrist ulnar deviation    Wrist radial deviation    Wrist pronation    Wrist supination    Grip strength     (Blank rows = not tested)  UPPER EXTREMITY ROM:  Active ROM Right eval Left eval  Shoulder flexion 55 (scaption) 55 (scaption)  Shoulder extension    Shoulder abduction    Shoulder adduction    Shoulder extension    Shoulder internal rotation    Shoulder external rotation    Elbow flexion    Elbow extension    Wrist flexion    Wrist extension    Wrist ulnar deviation    Wrist radial  deviation    Wrist pronation    Wrist supination     (Blank rows = not tested)    LUMBAR ROM:   AROM eval  Flexion   Extension   Right lateral flexion   Left lateral flexion   Right rotation   Left rotation    (Blank rows = not tested)  LOWER EXTREMITY ROM:     Passive  Right eval Left eval  Hip flexion    Hip extension    Hip abduction    Hip adduction    Hip internal rotation    Hip external rotation    Knee flexion    Knee extension    Ankle dorsiflexion    Ankle plantarflexion    Ankle inversion    Ankle eversion     (Blank rows = not tested)  LOWER EXTREMITY MMT:    MMT Right eval Left eval  Hip flexion    Hip extension    Hip abduction    Hip adduction    Hip internal rotation    Hip external rotation    Knee flexion    Knee extension    Ankle dorsiflexion    Ankle plantarflexion    Ankle inversion    Ankle eversion     (Blank rows = not tested)  LUMBAR SPECIAL TESTS:    FUNCTIONAL TESTS:    GAIT: Distance walked: 30 ft Assistive device utilized: None Level of assistance: SBA Comments: antalgic, decreased stance R LE, forward flexed, decreased gait speed   TREATMENT DATE: 05/26/2024                                                                                                                                  Neuromuscular re education  At start of session: Blood pressure L arm sitting, mechanically taken, normal cuff: 138/90, HR 73 SpO2 room air: 100%  With gentle caudal pressure to R scalene area  Gentle L cervical side bend 10x2   Then with gentle chin tuck and L cervical side bend. 10x2   Slight decrease in R UE symptoms   Seated regular chin tuck 10x, then 5x  Increased symptoms, takes time to ease off.   Seated B scapular retraction 10x, then 8x.   uncomfortable  Seated gentle manually resisted R shoulder extension isometrics with arm in slight flexion 10x3    Improved exercise technique, movement at target  joints, use of target muscles after mod verbal, visual, tactile cues.      Manual therapy  Seated gentle sustained A to P to R humeral head  Slight decrease in R median nerve symptoms.   Seated STM R posterior shoulder/infraspinatus area to decrease tension  Seated STM R  upper trap and distal scalene muscle area to decrease tension.   Seated STM R posterior lateral neck to decrease fascial restrictions and decrease cervical paraspinal muscle tension to neck  Seated STM L upper trap muscle to decrease tension  Increased R UE symptoms, eases slowly with rest.     PATIENT EDUCATION:  Education details: POC Person educated: Patient Education method: Explanation Education comprehension: verbalized understanding  HOME EXERCISE PROGRAM: Pt was recommended to press his tongue at the roof of his mouth to activate his anterior cervical muscles throughout the day. Pt demonstrated and verbalized  understanding.    Access Code: [email or web address] URL: [email or web address] Date: 05/10/2024 Prepared by: Suzzane Estes  Exercises - Seated Cervical Flexion AROM  - 2 x daily - 7 x weekly - 3 sets - 5 reps - Seated Transversus Abdominis Bracing  - 1 x daily - 7 x weekly - 2 -3 sets - 10 reps - 5 seconds hold - Seated Chin Tuck with Neck Elongation  - 2-3 x daily - 7 x weekly - 2-3 sets - 5 reps - Seated Cervical Sidebending AROM  - 3 x daily - 7 x weekly - 4 sets - 5 reps    ASSESSMENT:  CLINICAL IMPRESSION: Continued working on promoting gentle upper thoracic extension as well as L cervical side bending to decrease stress to R UE nerves. Continued working on STM to decrease muscle tension to neck. Reproduced R UE symptoms with STM to L upper trap/L rhomboid area. Fair tolerance to today's session, pt limited by pain. Pt will benefit from continued skilled physical therapy services to decrease pain, improve strength and function.     OBJECTIVE IMPAIRMENTS: Abnormal gait, decreased  balance, decreased endurance, difficulty walking, decreased ROM, decreased strength, dizziness, impaired UE functional use, improper body mechanics, postural dysfunction, and pain.   ACTIVITY LIMITATIONS: carrying, lifting, bending, sitting, standing, squatting, sleeping, stairs, transfers, bed mobility, bathing, toileting, dressing, reach over head, hygiene/grooming, locomotion level, and caring for others  PARTICIPATION LIMITATIONS:   PERSONAL FACTORS: Age, Fitness, Past/current experiences, Time since onset of injury/illness/exacerbation, and 3+ comorbidities: DVT, depression, dyspnea, heart attack, HTN, osteoporosis, are also affecting patient's functional outcome.   REHAB POTENTIAL: Fair    CLINICAL DECISION MAKING: Stable/uncomplicated  EVALUATION COMPLEXITY: Low   GOALS: Goals reviewed with patient? Yes  SHORT TERM GOALS: Target date: 05/13/2024  Pt will be independent with his initial HEP to improve strength, decrease pain, improve ability to lift, look around as well as ambulate and perform standing tasks with less difficulty and pain. Baseline: Pt has not yet officially started his initial HEP (05/02/2024) Goal status: INITIAL    LONG TERM GOALS: Target date: 07/01/2024  Pt will have a decrease in neck pain to 5/10 or less at worst to promote ability to look around more comfortably.  Baseline: 9/10 neck pain at worst for the past 3 months (05/02/2024) Goal status: INITIAL  2.  Pt will have a decrease on low back pain to 4/10 or less at worst to promote ability to tolerate sitting, standing, walking and performing standing tasks more comfortably for his back.  Baseline: 8/10 low back pain at worst for  Goal status: INITIAL  3.  Pt will improve his Neck Disability Index (NDI) score by at least 20 % as a demonstration of improved function.  Baseline: Neck Disability Index score: 41/50 (82%) (05/02/2024) Goal status: INITIAL  4.  Pt will improve his cervical rotation AROM to  50 degrees or more to promote ability to look around more comfortably. Baseline:  Active ROM A/PROM (deg) eval  Right rotation 20 degrees with pain  Left rotation 22 degrees with pain   (05/02/2024)  Goal status: INITIAL  5.  Pt will improve B shoulder flexion AROM to 100 degrees or more to promote ability to reach as well as lift more comfortably.  Baseline:  Active ROM Right eval Left eval  Shoulder flexion 55 (scaption) 55 (scaption)   (05/02/2024)  Goal status: INITIAL  6.  Pt will improve his shoulder flexion and lower trap strength by at least 1/2 MMT grade  to promote ability to raise his arms as well as lift with less difficulty.  Baseline:  MMT Right eval Left eval  Shoulder flexion 3+ with neck and shoulder and arm pain 4- with pain at available range.   Lower trapezius (seated manually resisted) 3+ with neck pain 3+ with neck pain   (05/02/2024)   Goal status: INITIAL   PLAN:  PT FREQUENCY: 1-2x/week  PT DURATION: 8 weeks  PLANNED INTERVENTIONS: 97110-Therapeutic exercises, 97530- Therapeutic activity, [id number]- Neuromuscular re-education, 97535- Self Care, [id number]- Manual therapy, [id number]- Gait training, [phone]- Electrical stimulation (unattended), [phone]- Ionotophoresis 4mg /ml Dexamethasone , Patient/Family education, and Balance training.  PLAN FOR NEXT SESSION: posture, B scapular, anterior cervical, trunk and glute strengthening, mechanics, manual techniques, modalities PRN   Cheryll Keisler, PT, DPT 05/26/2024, 9:08 AM

## 2024-05-30 ENCOUNTER — Ambulatory Visit

## 2024-05-30 DIAGNOSIS — M5412 Radiculopathy, cervical region: Secondary | ICD-10-CM

## 2024-05-30 DIAGNOSIS — M542 Cervicalgia: Secondary | ICD-10-CM | POA: Diagnosis not present

## 2024-05-30 NOTE — Therapy (Signed)
 OUTPATIENT PHYSICAL THERAPY Treatment   Patient Name: Jonathon Snow MRN: [id number] DOB:[birth year], 63 y.o., male Today's Date: 05/30/2024  END OF SESSION:  PT End of Session - 05/30/24 0903     Visit Number 8    Number of Visits 17    Date for PT Re-Evaluation 07/01/24    PT Start Time 0903    PT Stop Time 0944    PT Time Calculation (min) 41 min    Activity Tolerance Patient tolerated treatment well;Patient limited by pain    Behavior During Therapy [facility] for tasks assessed/performed                    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   Overview:  superior sagittal sinus, left transverse sinus and cortical veins    Closed left hip fracture, initial encounter (HCC) 02/18/2023   COVID-19 virus infection 12/2020   Depression    DVT (deep venous thrombosis) (HCC)    Dyspnea    GERD (gastroesophageal reflux disease)    Head injury 11/05/2023   Heart attack (HCC) 11/05/2023   Heel spur, left 02/16/2019   Heel spur, right 02/16/2019   Hematoma of groin 11/08/2023   Herpes zoster infection of lumbar region 02/20/2020   History of DVT (deep vein thrombosis) 03/22/2020   History of kidney stones    Hyperlipidemia    Hypertension    Long term current use of systemic steroids 11/08/2020   Lupus    Lymphedema 10/07/2018   Morbid obesity (HCC)    Opiate abuse, episodic (HCC) 02/26/2018   Osteoporosis    Pain and swelling of right lower extremity 02/03/2023   Pneumonia    PONV (postoperative nausea and vomiting)    Postphlebitic syndrome with ulcer, 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   Presence of IVC filter 03/22/2020   Removed   Pulmonary embolism (HCC)    Renal disorder    Stage III   Severe episode of recurrent major depressive disorder,  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   Shock circulatory (HCC) 11/06/2023   STEMI (ST elevation myocardial infarction) (HCC) 11/05/2023   Past Surgical History:  Procedure Laterality Date   ANKLE SURGERY Right    BACK SURGERY     BRONCHIAL WASHINGS N/A 11/01/2021   Procedure: BRONCHIAL WASHINGS;  Surgeon: Erskin Hearing, MD;  Location: [facility] ORS;  Service: Thoracic;  Laterality: N/A;   COLONOSCOPY WITH PROPOFOL  N/A 05/28/2020   Procedure: COLONOSCOPY WITH PROPOFOL ;  Surgeon: Luke Salaam, MD;  Location: [facility] ENDOSCOPY;  Service: Endoscopy;  Laterality: N/A;   COLONOSCOPY WITH PROPOFOL  N/A 07/01/2023   Procedure: COLONOSCOPY WITH PROPOFOL ;  Surgeon: Luke Salaam, MD;  Location: [facility] ENDOSCOPY;  Service: Gastroenterology;  Laterality: N/A;   CORONARY ULTRASOUND/IVUS N/A 11/05/2023   Procedure: Coronary Ultrasound/IVUS;  Surgeon: Sammy Crisp, MD;  Location: [facility] INVASIVE CV LAB;  Service: Cardiovascular;  Laterality: N/A;   CORONARY/GRAFT ACUTE MI REVASCULARIZATION N/A 11/05/2023   Procedure: Coronary/Graft Acute MI Revascularization;  Surgeon: Sammy Crisp, MD;  Location: [facility] INVASIVE CV LAB;  Service: Cardiovascular;  Laterality: N/A;   CYST EXCISION  92 or 93    Liver cyst removal [facility]   FLEXIBLE BRONCHOSCOPY N/A 11/01/2021   Procedure: FLEXIBLE BRONCHOSCOPY;  Surgeon: Erskin Hearing, MD;  Location: [facility] ORS;  Service: Thoracic;  Laterality:  N/A;   HIP PINNING,CANNULATED Left 02/19/2023   Procedure: PERCUTANEOUS FIXATION OF FEMORAL NECK;  Surgeon: Lorri Rota, MD;  Location: [facility] ORS;  Service: Orthopedics;  Laterality: Left;   I & D EXTREMITY Right 04/29/2017   Procedure: IRRIGATION AND DEBRIDEMENT EXTREMITY;  Surgeon: Gwyndolyn Lerner, MD;  Location: [facility] ORS;  Service: General;  Laterality: Right;   IRRIGATION AND DEBRIDEMENT ABSCESS Left 04/29/2017   Procedure: IRRIGATION AND DEBRIDEMENT Scrotal ABSCESS;  Surgeon: Gwyndolyn Lerner, MD;  Location: [facility] ORS;  Service:  General;  Laterality: Left;   LEFT HEART CATH AND CORONARY ANGIOGRAPHY N/A 11/05/2023   Procedure: LEFT HEART CATH AND CORONARY ANGIOGRAPHY;  Surgeon: Sammy Crisp, MD;  Location: [facility] INVASIVE CV LAB;  Service: Cardiovascular;  Laterality: N/A;   POLYPECTOMY  07/01/2023   Procedure: POLYPECTOMY;  Surgeon: Luke Salaam, MD;  Location: [facility] ENDOSCOPY;  Service: Gastroenterology;;   Patient Active Problem List   Diagnosis Date Noted   Cervical radicular pain 05/26/2024   History of thoracic spinal fusion (T7-T12) 05/26/2024   History of lumbar spinal fusion (L5-S1) 05/26/2024   Chronic pain syndrome 05/26/2024   Anxiety and depression 02/26/2024   Dyslipidemia 02/26/2024   Peripheral neuropathy 02/26/2024   Essential hypertension 02/26/2024   Vertigo 02/26/2024   Syncope 02/25/2024   Orthostatic hypotension 02/11/2024   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) 01/20/2024   Anxiety 12/14/2023   Overweight (BMI 25.0-29.9) 11/23/2023   Chronic heart failure with preserved ejection fraction (HFpEF) (HCC) 11/23/2023   BPH (benign prostatic hyperplasia) 11/23/2023   Unstable angina (HCC) 11/10/2023   Closed T10 spinal fracture (HCC) 11/06/2023   Closed tricolumnar fracture of thoracic vertebra (HCC) 11/06/2023   Thoracic spine instability 11/06/2023   Chronic bilateral low back pain with left-sided sciatica 07/07/2023   Neuropathy 07/07/2023   Hx of colonic polyps 07/01/2023   Adenomatous polyp of colon 07/01/2023   Erectile disorder 01/22/2023   Coronary artery disease involving native coronary artery of native heart without angina pectoris 09/09/2022   Varicose 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 01/03/2022   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) 09/18/2021   Prediabetes 11/08/2020   Stage 3a chronic kidney disease (HCC) 11/08/2020   Seasonal allergies 11/08/2020   Rhinosinusitis 11/08/2020   Anticoagulant disorder (HCC) 11/07/2020    Senile purpura (HCC) 11/07/2020   MDD (major depressive disorder), recurrent episode, mild (HCC) 09/12/2019   Other spondylosis with radiculopathy, lumbar region 02/16/2019   Osteoporosis 10/12/2018   Chronic venous insufficiency 10/07/2018   Lymphedema 10/07/2018   SLE glomerulonephritis syndrome, 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HCC) 03/03/2018   Syncope and collapse 12/29/2017   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) 10/29/2016   Hyperlipidemia LDL goal <70 01/15/2016   Uncontrolled hypertension 01/15/2016   Obesity (BMI 30.0-34.9) 01/15/2016   Long term current use of anticoagulant 10/04/2015   Systemic lupus erythematosus (HCC) 05/30/2015   COPD, moderate (HCC) 06/27/2014   Shortness of breath 06/27/2014   History of pulmonary embolism 12/11/2013   Nodule of right lung 12/11/2013   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   Cystic disease of liver 08/21/2013    PCP: Adeline Hone, PA-C   REFERRING PROVIDER: Jodeen Munch, MD  REFERRING DIAG: [phone] (ICD-10-CM) - Cervical radiculopathy M54.50,G89.29 (ICD-10-CM) - Chronic bilateral low back pain without sciatica  Rationale for Evaluation and Treatment: Rehabilitation  THERAPY DIAG:  Cervicalgia  Radiculopathy, cervical region  ONSET DATE: November 2024  SUBJECTIVE:  SUBJECTIVE STATEMENT: Doing alright. R UE has been pretty bad the last couple of days. L UE is not as bad as the R UE (anterior) to R first and 4-5th digits.  Neck is about a 7/10 currently, R UE 8/10 currently. Going to get an injection for his neck in the next 2 weeks.         PERTINENT HISTORY:  Cervicalgia, low back pain. Pt also reports R > L C5/C6 dermatome to elbow bilaterally. Pt has a difficult time turning his  head and looking up. No neck surgery. Had Thoracic and lumbar spine fusion surgery. Had mid back surgery (thoracic) November 2024 due to his fall. Neck pain is more recent. Neck and shoulder pain began November 2024 after his thoracic surgery. Usually sleeps on his L side but is currently difficulty to do secondary to B shoulder and neck pain, so pt is not sleeping well. Neck pain is the same since November 2024. Pt tends to have issues getting dizzy and passes out when he is standing and exerting himself  Vision gets blurry and splotchy. Room feels like it is waving around, pt is not walking on a stable surface, feels like he is walking on a boat and not having sea legs.   The IVC filter was removed.  Still has L LE DVT. Pt states taking Elequis. L LE DVT is still being treated.    PAIN:  Are you having pain? Yes: NPRS scale: /10 currently Pain location: neck (cervical paraspinals and upper trap) and B C5/C6 area to elbow Pain description: really really sore Aggravating factors: raising his arms up such as to reach, lifting with his arm, turning his head, looking up.  Relieving factors: Laying flat on his back on his bed with a pillow.    Pt also gets a stinging sensation at mid back around the T7 and above area, not all the time   Are you having pain? Yes: NPRS scale: /10 currently (pt sitting on a chair) Pain location: L low back pain; L L4 dermatome radiating symptoms to big toe.  Pain description: stinging, burning, ache Aggravating factors: sitting for long periods, about 15-20 minutes, trying to move around, bending lifting,   Relieving factors: Laying flat on his back, on the bed  PRECAUTIONS: dizzy and passes out when he is standing and exerting himself  L LE DVT, fall risk  RED FLAGS: Bowel or bladder incontinence: No and Cauda equina syndrome: No   WEIGHT BEARING RESTRICTIONS: No  FALLS:  Has patient fallen in last 6 months? Yes. Number of falls 2 (original fall was when  pt had a heart attack November 2024 and hurt his back. February 22, 2024, pt passed out and fell into the bath tub.   LIVING ENVIRONMENT: Lives with: lives alone Lives in: House/apartment Stairs: No Has following equipment at home: Single point cane and Environmental consultant - 2 wheeled  OCCUPATION: Not currently working  PLOF: Needs assistance with homemaking   PATIENT GOALS: be able to do everything better.   NEXT MD VISIT: Yes but does not know when.   OBJECTIVE:  Note: Objective measures were completed at Evaluation unless otherwise noted.  DIAGNOSTIC FINDINGS:  MR CERVICAL SPINE WO CONTRAST 02/26/2024  Narrative & Impression  CLINICAL DATA:  Myelopathy, acute, cervical spine   EXAM: MRI CERVICAL SPINE WITHOUT CONTRAST   TECHNIQUE: Multiplanar, multisequence MR imaging of the cervical spine was performed. No intravenous contrast was administered.   COMPARISON:  CT of the cervical spine  November 05, 2023.   FINDINGS: Alignment: No substantial sagittal subluxation.   Vertebrae: No evidence of acute fracture, evidence of discitis, or suspicious bone lesion. Benign vertebral venous malformation at T3. Mild T1-T2 height loss, unchanged.   Cord: Normal cord signal.  Neck   Posterior Fossa, vertebral arteries, paraspinal tissues: Visualized vertebral artery flow voids are maintained. No paraspinal edema.   Disc levels:   C2-C3: No significant disc protrusion, foraminal stenosis, or canal stenosis.   C3-C4: No significant disc protrusion, foraminal stenosis, or canal stenosis.   C4-C5: Right eccentric posterior disc osteophyte complex with right greater than left facet and uncovertebral hypertrophy. Resulting mild right greater than left foraminal stenosis. Mild canal stenosis.   C5-C6: Posterior disc osteophyte complex ligamentum flavum thickening and right greater than left facet and uncovertebral hypertrophy. Resulting mild-to-moderate bilateral foraminal stenosis. Mild to  moderate canal stenosis.   C6-C7: Posterior disc osteophyte complex with bilateral facet and uncovertebral hypertrophy. Resulting moderate bilateral foraminal stenosis. Mild canal stenosis.   C7-T1: No significant disc protrusion, foraminal stenosis, or canal stenosis.   IMPRESSION: 1. 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 2. At C5-C6, mild to moderate canal and bilateral foraminal stenosis. 3. At C4-C5, mild canal and bilateral foraminal stenosis.     Electronically Signed   By: Stevenson Elbe M.D.   On: 02/27/2024 02:42         PATIENT SURVEYS:  Neck Disability Index score: 41/50 (82%) (05/02/2024)  COGNITION: Overall cognitive status: Within functional limits for tasks assessed     SENSATION:   MUSCLE LENGTH:   POSTURE:   PALPATION: TTP to posterior neck   CERVICAL ROM:   Active ROM A/PROM (deg) eval  Flexion WFL with posterior neck pain  Extension Very limited with pain, movement preference around C5/C6 area  Right lateral flexion Very limited with pain  Left lateral flexion Very limited with pain  Right rotation 20 degrees with pain  Left rotation 22 degrees with pain   (Blank rows = not tested)  UPPER EXTREMITY MMT:  MMT Right eval Left eval  Shoulder flexion 3+ with neck and shoulder and arm pain 4- with pain at available range.   Shoulder extension    Shoulder abduction    Shoulder adduction    Shoulder extension    Shoulder internal rotation    Shoulder external rotation    Middle trapezius    Lower trapezius (seated manually resisted) 3+ with neck pain 3+ with neck pain  Elbow flexion    Elbow extension    Wrist flexion    Wrist extension    Wrist ulnar deviation    Wrist radial deviation    Wrist pronation    Wrist supination    Grip strength     (Blank rows = not tested)  UPPER EXTREMITY ROM:  Active ROM Right eval Left eval  Shoulder flexion 55 (scaption) 55 (scaption)  Shoulder  extension    Shoulder abduction    Shoulder adduction    Shoulder extension    Shoulder internal rotation    Shoulder external rotation    Elbow flexion    Elbow extension    Wrist flexion    Wrist extension    Wrist ulnar deviation    Wrist radial deviation    Wrist pronation    Wrist supination     (Blank rows = not tested)    LUMBAR ROM:   AROM eval  Flexion   Extension   Right lateral  flexion   Left lateral flexion   Right rotation   Left rotation    (Blank rows = not tested)  LOWER EXTREMITY ROM:     Passive  Right eval Left eval  Hip flexion    Hip extension    Hip abduction    Hip adduction    Hip internal rotation    Hip external rotation    Knee flexion    Knee extension    Ankle dorsiflexion    Ankle plantarflexion    Ankle inversion    Ankle eversion     (Blank rows = not tested)  LOWER EXTREMITY MMT:    MMT Right eval Left eval  Hip flexion    Hip extension    Hip abduction    Hip adduction    Hip internal rotation    Hip external rotation    Knee flexion    Knee extension    Ankle dorsiflexion    Ankle plantarflexion    Ankle inversion    Ankle eversion     (Blank rows = not tested)  LUMBAR SPECIAL TESTS:    FUNCTIONAL TESTS:    GAIT: Distance walked: 30 ft Assistive device utilized: None Level of assistance: SBA Comments: antalgic, decreased stance R LE, forward flexed, decreased gait speed   TREATMENT DATE: 05/30/2024                                                                                                                                  Neuromuscular re education  At start of session: Blood pressure L arm sitting, mechanically taken, normal cuff: 109/65, HR 73 SpO2 room air: 99%  Seated chin tuck with gentle cervical flexion 10x3  Pt states the exercise still helps.   With gentle caudal pressure to R scalene area  Gentle L cervical side bend 10x2  Gentle chin tuck with gentle cervical flexion   With  L cervical gentle side bend 5x3   Discomfort but eases quickly after rest   R UE pain centralizing after aforementioned exercises, now at elbow instead of hand.   Seated chin tuck with gentle cervical flexion 10x3   Gentle chin tuck with gentle cervical flexion   With L cervical gentle side bend 5x2   Discomfort but eases quickly after rest  Seated chin tuck with gentle cervical flexion 10x3        Improved exercise technique, movement at target joints, use of target muscles after mod verbal, visual, tactile cues.      Manual therapy  Seated STM R upper trap area to decrease tension  R posterior lateral headache  Seated STM B cervical paraspinal muscles to decrease tension  Pt states feeling nausea. Eases with rest.   Seated STM R infraspinatus to decrease muscle tension   Increased R arm symptoms.     PATIENT EDUCATION:  Education details: POC Person educated: Patient Education method: Explanation Education comprehension: verbalized understanding  HOME EXERCISE PROGRAM: Pt was recommended to press his tongue at the roof of his mouth to activate his anterior cervical muscles throughout the day. Pt demonstrated and verbalized understanding.    Access Code: [email or web address] URL: [email or web address] Date: 05/10/2024 Prepared by: Suzzane Estes  Exercises - Seated Cervical Flexion AROM  - 2 x daily - 7 x weekly - 3 sets - 5 reps - Seated Transversus Abdominis Bracing  - 1 x daily - 7 x weekly - 2 -3 sets - 10 reps - 5 seconds hold - Seated Chin Tuck with Neck Elongation  - 2-3 x daily - 7 x weekly - 2-3 sets - 5 reps - Seated Cervical Sidebending AROM  - 3 x daily - 7 x weekly - 4 sets - 5 reps    ASSESSMENT:  CLINICAL IMPRESSION: Seated chin tucks while in gentle cervical flexion is most effective in centralization of R UE symptoms per pt. Rest of exercises and manual therapy seem to increase symptoms, eases slowly with rest.  Fair tolerance to today's  session, pt limited by pain. Pt will benefit from continued skilled physical therapy services to decrease pain, improve strength and function.     OBJECTIVE IMPAIRMENTS: Abnormal gait, decreased balance, decreased endurance, difficulty walking, decreased ROM, decreased strength, dizziness, impaired UE functional use, improper body mechanics, postural dysfunction, and pain.   ACTIVITY LIMITATIONS: carrying, lifting, bending, sitting, standing, squatting, sleeping, stairs, transfers, bed mobility, bathing, toileting, dressing, reach over head, hygiene/grooming, locomotion level, and caring for others  PARTICIPATION LIMITATIONS:   PERSONAL FACTORS: Age, Fitness, Past/current experiences, Time since onset of injury/illness/exacerbation, and 3+ comorbidities: DVT, depression, dyspnea, heart attack, HTN, osteoporosis, are also affecting patient's functional outcome.   REHAB POTENTIAL: Fair    CLINICAL DECISION MAKING: Stable/uncomplicated  EVALUATION COMPLEXITY: Low   GOALS: Goals reviewed with patient? Yes  SHORT TERM GOALS: Target date: 05/13/2024  Pt will be independent with his initial HEP to improve strength, decrease pain, improve ability to lift, look around as well as ambulate and perform standing tasks with less difficulty and pain. Baseline: Pt has not yet officially started his initial HEP (05/02/2024) Goal status: INITIAL    LONG TERM GOALS: Target date: 07/01/2024  Pt will have a decrease in neck pain to 5/10 or less at worst to promote ability to look around more comfortably.  Baseline: 9/10 neck pain at worst for the past 3 months (05/02/2024) Goal status: INITIAL  2.  Pt will have a decrease on low back pain to 4/10 or less at worst to promote ability to tolerate sitting, standing, walking and performing standing tasks more comfortably for his back.  Baseline: 8/10 low back pain at worst for  Goal status: INITIAL  3.  Pt will improve his Neck Disability Index (NDI) score  by at least 20 % as a demonstration of improved function.  Baseline: Neck Disability Index score: 41/50 (82%) (05/02/2024) Goal status: INITIAL  4.  Pt will improve his cervical rotation AROM to 50 degrees or more to promote ability to look around more comfortably. Baseline:  Active ROM A/PROM (deg) eval  Right rotation 20 degrees with pain  Left rotation 22 degrees with pain   (05/02/2024)  Goal status: INITIAL  5.  Pt will improve B shoulder flexion AROM to 100 degrees or more to promote ability to reach as well as lift more comfortably.  Baseline:  Active ROM Right eval Left eval  Shoulder flexion 55 (scaption) 55 (scaption)   (05/02/2024)  Goal status: INITIAL  6.  Pt will improve his shoulder flexion and lower trap strength by at least 1/2 MMT grade to promote ability to raise his arms as well as lift with less difficulty.  Baseline:  MMT Right eval Left eval  Shoulder flexion 3+ with neck and shoulder and arm pain 4- with pain at available range.   Lower trapezius (seated manually resisted) 3+ with neck pain 3+ with neck pain   (05/02/2024)   Goal status: INITIAL   PLAN:  PT FREQUENCY: 1-2x/week  PT DURATION: 8 weeks  PLANNED INTERVENTIONS: 97110-Therapeutic exercises, 97530- Therapeutic activity, [id number]- Neuromuscular re-education, 97535- Self Care, 62952- Manual therapy, [id number]- Gait training, [phone]- Electrical stimulation (unattended), [phone]- Ionotophoresis 4mg /ml Dexamethasone , Patient/Family education, and Balance training.  PLAN FOR NEXT SESSION: posture, B scapular, anterior cervical, trunk and glute strengthening, mechanics, manual techniques, modalities PRN   Niels Cranshaw, PT, DPT 05/30/2024, 9:46 AM

## 2024-06-01 ENCOUNTER — Ambulatory Visit: Admitting: Family Medicine

## 2024-06-02 ENCOUNTER — Ambulatory Visit

## 2024-06-02 DIAGNOSIS — M542 Cervicalgia: Secondary | ICD-10-CM

## 2024-06-02 DIAGNOSIS — M5412 Radiculopathy, cervical region: Secondary | ICD-10-CM

## 2024-06-02 NOTE — Therapy (Signed)
 OUTPATIENT PHYSICAL THERAPY Treatment   Patient Name: Jonathon Snow MRN: [id number] DOB:[birth year], 63 y.o., male Today's Date: 06/02/2024  END OF SESSION:  PT End of Session - 06/02/24 0948     Visit Number 9    Number of Visits 17    Date for PT Re-Evaluation 07/01/24    PT Start Time 0949    PT Stop Time 1028    PT Time Calculation (min) 39 min    Activity Tolerance Patient tolerated treatment well;Patient limited by pain    Behavior During Therapy [facility] for tasks assessed/performed                  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   Overview:  superior sagittal sinus, left transverse sinus and cortical veins    Closed left hip fracture, initial encounter (HCC) 02/18/2023   COVID-19 virus infection 12/2020   Depression    DVT (deep venous thrombosis) (HCC)    Dyspnea    GERD (gastroesophageal reflux disease)    Head injury 11/05/2023   Heart attack (HCC) 11/05/2023   Heel spur, left 02/16/2019   Heel spur, right 02/16/2019   Hematoma of groin 11/08/2023   Herpes zoster infection of lumbar region 02/20/2020   History of DVT (deep vein thrombosis) 03/22/2020   History of kidney stones    Hyperlipidemia    Hypertension    Long term current use of systemic steroids 11/08/2020   Lupus    Lymphedema 10/07/2018   Morbid obesity (HCC)    Opiate abuse, episodic (HCC) 02/26/2018   Osteoporosis    Pain and swelling of right lower extremity 02/03/2023   Pneumonia    PONV (postoperative nausea and vomiting)    Postphlebitic syndrome with ulcer, 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   Presence of IVC filter 03/22/2020   Removed   Pulmonary embolism (HCC)    Renal disorder    Stage III   Severe episode of recurrent major depressive disorder,  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   Shock circulatory (HCC) 11/06/2023   STEMI (ST elevation myocardial infarction) (HCC) 11/05/2023   Past Surgical History:  Procedure Laterality Date   ANKLE SURGERY Right    BACK SURGERY     BRONCHIAL WASHINGS N/A 11/01/2021   Procedure: BRONCHIAL WASHINGS;  Surgeon: Erskin Hearing, MD;  Location: [facility] ORS;  Service: Thoracic;  Laterality: N/A;   COLONOSCOPY WITH PROPOFOL  N/A 05/28/2020   Procedure: COLONOSCOPY WITH PROPOFOL ;  Surgeon: Luke Salaam, MD;  Location: [facility] ENDOSCOPY;  Service: Endoscopy;  Laterality: N/A;   COLONOSCOPY WITH PROPOFOL  N/A 07/01/2023   Procedure: COLONOSCOPY WITH PROPOFOL ;  Surgeon: Luke Salaam, MD;  Location: [facility] ENDOSCOPY;  Service: Gastroenterology;  Laterality: N/A;   CORONARY ULTRASOUND/IVUS N/A 11/05/2023   Procedure: Coronary Ultrasound/IVUS;  Surgeon: Sammy Crisp, MD;  Location: [facility] INVASIVE CV LAB;  Service: Cardiovascular;  Laterality: N/A;   CORONARY/GRAFT ACUTE MI REVASCULARIZATION N/A 11/05/2023   Procedure: Coronary/Graft Acute MI Revascularization;  Surgeon: Sammy Crisp, MD;  Location: [facility] INVASIVE CV LAB;  Service: Cardiovascular;  Laterality: N/A;   CYST EXCISION  92 or 93    Liver cyst removal [facility]   FLEXIBLE BRONCHOSCOPY N/A 11/01/2021   Procedure: FLEXIBLE BRONCHOSCOPY;  Surgeon: Erskin Hearing, MD;  Location: [facility] ORS;  Service: Thoracic;  Laterality: N/A;  HIP PINNING,CANNULATED Left 02/19/2023   Procedure: PERCUTANEOUS FIXATION OF FEMORAL NECK;  Surgeon: Lorri Rota, MD;  Location: [facility] ORS;  Service: Orthopedics;  Laterality: Left;   I & D EXTREMITY Right 04/29/2017   Procedure: IRRIGATION AND DEBRIDEMENT EXTREMITY;  Surgeon: Gwyndolyn Lerner, MD;  Location: [facility] ORS;  Service: General;  Laterality: Right;   IRRIGATION AND DEBRIDEMENT ABSCESS Left 04/29/2017   Procedure: IRRIGATION AND DEBRIDEMENT Scrotal ABSCESS;  Surgeon: Gwyndolyn Lerner, MD;  Location: [facility] ORS;  Service:  General;  Laterality: Left;   LEFT HEART CATH AND CORONARY ANGIOGRAPHY N/A 11/05/2023   Procedure: LEFT HEART CATH AND CORONARY ANGIOGRAPHY;  Surgeon: Sammy Crisp, MD;  Location: [facility] INVASIVE CV LAB;  Service: Cardiovascular;  Laterality: N/A;   POLYPECTOMY  07/01/2023   Procedure: POLYPECTOMY;  Surgeon: Luke Salaam, MD;  Location: [facility] ENDOSCOPY;  Service: Gastroenterology;;   Patient Active Problem List   Diagnosis Date Noted   Cervical radicular pain 05/26/2024   History of thoracic spinal fusion (T7-T12) 05/26/2024   History of lumbar spinal fusion (L5-S1) 05/26/2024   Chronic pain syndrome 05/26/2024   Anxiety and depression 02/26/2024   Dyslipidemia 02/26/2024   Peripheral neuropathy 02/26/2024   Essential hypertension 02/26/2024   Vertigo 02/26/2024   Syncope 02/25/2024   Orthostatic hypotension 02/11/2024   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) 01/20/2024   Anxiety 12/14/2023   Overweight (BMI 25.0-29.9) 11/23/2023   Chronic heart failure with preserved ejection fraction (HFpEF) (HCC) 11/23/2023   BPH (benign prostatic hyperplasia) 11/23/2023   Unstable angina (HCC) 11/10/2023   Closed T10 spinal fracture (HCC) 11/06/2023   Closed tricolumnar fracture of thoracic vertebra (HCC) 11/06/2023   Thoracic spine instability 11/06/2023   Chronic bilateral low back pain with left-sided sciatica 07/07/2023   Neuropathy 07/07/2023   Hx of colonic polyps 07/01/2023   Adenomatous polyp of colon 07/01/2023   Erectile disorder 01/22/2023   Coronary artery disease involving native coronary artery of native heart without angina pectoris 09/09/2022   Varicose 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 01/03/2022   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) 09/18/2021   Prediabetes 11/08/2020   Stage 3a chronic kidney disease (HCC) 11/08/2020   Seasonal allergies 11/08/2020   Rhinosinusitis 11/08/2020   Anticoagulant disorder (HCC) 11/07/2020    Senile purpura (HCC) 11/07/2020   MDD (major depressive disorder), recurrent episode, mild (HCC) 09/12/2019   Other spondylosis with radiculopathy, lumbar region 02/16/2019   Osteoporosis 10/12/2018   Chronic venous insufficiency 10/07/2018   Lymphedema 10/07/2018   SLE glomerulonephritis syndrome, 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HCC) 03/03/2018   Syncope and collapse 12/29/2017   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) 10/29/2016   Hyperlipidemia LDL goal <70 01/15/2016   Uncontrolled hypertension 01/15/2016   Obesity (BMI 30.0-34.9) 01/15/2016   Long term current use of anticoagulant 10/04/2015   Systemic lupus erythematosus (HCC) 05/30/2015   COPD, moderate (HCC) 06/27/2014   Shortness of breath 06/27/2014   History of pulmonary embolism 12/11/2013   Nodule of right lung 12/11/2013   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   Cystic disease of liver 08/21/2013    PCP: Adeline Hone, PA-C   REFERRING PROVIDER: Jodeen Munch, MD  REFERRING DIAG: [phone] (ICD-10-CM) - Cervical radiculopathy M54.50,G89.29 (ICD-10-CM) - Chronic bilateral low back pain without sciatica  Rationale for Evaluation and Treatment: Rehabilitation  THERAPY DIAG:  Cervicalgia  Radiculopathy, cervical region  ONSET DATE: November 2024  SUBJECTIVE:  SUBJECTIVE STATEMENT: Neck seems to be doing a little bit better today, a little bit more mobility. R UE bothers him, 7/10 R UE pain currently. 4/10 neck pain currently. Thinks the chin tucks are working. Doctor also increased the amount of tramadol  over a week ago          PERTINENT HISTORY:  Cervicalgia, low back pain. Pt also reports R > L C5/C6 dermatome to elbow bilaterally. Pt has a difficult time turning his head  and looking up. No neck surgery. Had Thoracic and lumbar spine fusion surgery. Had mid back surgery (thoracic) November 2024 due to his fall. Neck pain is more recent. Neck and shoulder pain began November 2024 after his thoracic surgery. Usually sleeps on his L side but is currently difficulty to do secondary to B shoulder and neck pain, so pt is not sleeping well. Neck pain is the same since November 2024. Pt tends to have issues getting dizzy and passes out when he is standing and exerting himself  Vision gets blurry and splotchy. Room feels like it is waving around, pt is not walking on a stable surface, feels like he is walking on a boat and not having sea legs.   The IVC filter was removed.  Still has L LE DVT. Pt states taking Elequis. L LE DVT is still being treated.    PAIN:  Are you having pain? Yes: NPRS scale: /10 currently Pain location: neck (cervical paraspinals and upper trap) and B C5/C6 area to elbow Pain description: really really sore Aggravating factors: raising his arms up such as to reach, lifting with his arm, turning his head, looking up.  Relieving factors: Laying flat on his back on his bed with a pillow.    Pt also gets a stinging sensation at mid back around the T7 and above area, not all the time   Are you having pain? Yes: NPRS scale: /10 currently (pt sitting on a chair) Pain location: L low back pain; L L4 dermatome radiating symptoms to big toe.  Pain description: stinging, burning, ache Aggravating factors: sitting for long periods, about 15-20 minutes, trying to move around, bending lifting,   Relieving factors: Laying flat on his back, on the bed  PRECAUTIONS: dizzy and passes out when he is standing and exerting himself  L LE DVT, fall risk  RED FLAGS: Bowel or bladder incontinence: No and Cauda equina syndrome: No   WEIGHT BEARING RESTRICTIONS: No  FALLS:  Has patient fallen in last 6 months? Yes. Number of falls 2 (original fall was when pt  had a heart attack November 2024 and hurt his back. February 22, 2024, pt passed out and fell into the bath tub.   LIVING ENVIRONMENT: Lives with: lives alone Lives in: House/apartment Stairs: No Has following equipment at home: Single point cane and Environmental consultant - 2 wheeled  OCCUPATION: Not currently working  PLOF: Needs assistance with homemaking   PATIENT GOALS: be able to do everything better.   NEXT MD VISIT: Yes but does not know when.   OBJECTIVE:  Note: Objective measures were completed at Evaluation unless otherwise noted.  DIAGNOSTIC FINDINGS:  MR CERVICAL SPINE WO CONTRAST 02/26/2024  Narrative & Impression  CLINICAL DATA:  Myelopathy, acute, cervical spine   EXAM: MRI CERVICAL SPINE WITHOUT CONTRAST   TECHNIQUE: Multiplanar, multisequence MR imaging of the cervical spine was performed. No intravenous contrast was administered.   COMPARISON:  CT of the cervical spine November 05, 2023.   FINDINGS: Alignment: No  substantial sagittal subluxation.   Vertebrae: No evidence of acute fracture, evidence of discitis, or suspicious bone lesion. Benign vertebral venous malformation at T3. Mild T1-T2 height loss, unchanged.   Cord: Normal cord signal.  Neck   Posterior Fossa, vertebral arteries, paraspinal tissues: Visualized vertebral artery flow voids are maintained. No paraspinal edema.   Disc levels:   C2-C3: No significant disc protrusion, foraminal stenosis, or canal stenosis.   C3-C4: No significant disc protrusion, foraminal stenosis, or canal stenosis.   C4-C5: Right eccentric posterior disc osteophyte complex with right greater than left facet and uncovertebral hypertrophy. Resulting mild right greater than left foraminal stenosis. Mild canal stenosis.   C5-C6: Posterior disc osteophyte complex ligamentum flavum thickening and right greater than left facet and uncovertebral hypertrophy. Resulting mild-to-moderate bilateral foraminal stenosis. Mild to  moderate canal stenosis.   C6-C7: Posterior disc osteophyte complex with bilateral facet and uncovertebral hypertrophy. Resulting moderate bilateral foraminal stenosis. Mild canal stenosis.   C7-T1: No significant disc protrusion, foraminal stenosis, or canal stenosis.   IMPRESSION: 1. 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 2. At C5-C6, mild to moderate canal and bilateral foraminal stenosis. 3. At C4-C5, mild canal and bilateral foraminal stenosis.     Electronically Signed   By: Stevenson Elbe M.D.   On: 02/27/2024 02:42         PATIENT SURVEYS:  Neck Disability Index score: 41/50 (82%) (05/02/2024)  COGNITION: Overall cognitive status: Within functional limits for tasks assessed     SENSATION:   MUSCLE LENGTH:   POSTURE:   PALPATION: TTP to posterior neck   CERVICAL ROM:   Active ROM A/PROM (deg) eval  Flexion WFL with posterior neck pain  Extension Very limited with pain, movement preference around C5/C6 area  Right lateral flexion Very limited with pain  Left lateral flexion Very limited with pain  Right rotation 20 degrees with pain  Left rotation 22 degrees with pain   (Blank rows = not tested)  UPPER EXTREMITY MMT:  MMT Right eval Left eval  Shoulder flexion 3+ with neck and shoulder and arm pain 4- with pain at available range.   Shoulder extension    Shoulder abduction    Shoulder adduction    Shoulder extension    Shoulder internal rotation    Shoulder external rotation    Middle trapezius    Lower trapezius (seated manually resisted) 3+ with neck pain 3+ with neck pain  Elbow flexion    Elbow extension    Wrist flexion    Wrist extension    Wrist ulnar deviation    Wrist radial deviation    Wrist pronation    Wrist supination    Grip strength     (Blank rows = not tested)  UPPER EXTREMITY ROM:  Active ROM Right eval Left eval  Shoulder flexion 55 (scaption) 55 (scaption)  Shoulder  extension    Shoulder abduction    Shoulder adduction    Shoulder extension    Shoulder internal rotation    Shoulder external rotation    Elbow flexion    Elbow extension    Wrist flexion    Wrist extension    Wrist ulnar deviation    Wrist radial deviation    Wrist pronation    Wrist supination     (Blank rows = not tested)    LUMBAR ROM:   AROM eval  Flexion   Extension   Right lateral flexion   Left lateral flexion  Right rotation   Left rotation    (Blank rows = not tested)  LOWER EXTREMITY ROM:     Passive  Right eval Left eval  Hip flexion    Hip extension    Hip abduction    Hip adduction    Hip internal rotation    Hip external rotation    Knee flexion    Knee extension    Ankle dorsiflexion    Ankle plantarflexion    Ankle inversion    Ankle eversion     (Blank rows = not tested)  LOWER EXTREMITY MMT:    MMT Right eval Left eval  Hip flexion    Hip extension    Hip abduction    Hip adduction    Hip internal rotation    Hip external rotation    Knee flexion    Knee extension    Ankle dorsiflexion    Ankle plantarflexion    Ankle inversion    Ankle eversion     (Blank rows = not tested)  LUMBAR SPECIAL TESTS:    FUNCTIONAL TESTS:    GAIT: Distance walked: 30 ft Assistive device utilized: None Level of assistance: SBA Comments: antalgic, decreased stance R LE, forward flexed, decreased gait speed   TREATMENT DATE: 06/02/2024                                                                                                                                  Neuromuscular re education  At start of session: Blood pressure L arm sitting, mechanically taken, normal cuff: 127/80, HR 75  Seated cervical rotation AROM 1x each way  Seated shoulder flexion AROM 1x each  Seated B scapular retraction with cues for posterior tipping and depression 5x5 seconds fo 3 sets  Decreased R scalane muscle tension with posterior tipping and  depression of R scapula  Seated chin tuck with gentle cervical flexion 10x5 seconds   Seated gentle thoracic extension on chair 10x, then 5x3  Seated B scapular retraction with cues for posterior tipping and depression 5x3  Seated chin tuck with gentle cervical flexion 10x  Then 5x5 seconds for 2 sets  Seated gentle L cervical side bend 5x2  Slight decrease in R UE symptoms.      Improved exercise technique, movement at target joints, use of target muscles after mod verbal, visual, tactile cues.      Manual therapy   Seated STM R upper trap area to decrease tension  Increased discomfort  Seated STM R infraspinatus to decrease muscle tension   Increased R arm symptoms.   Seated STM L upper trap muscle to decrease tension   Increased R UE symptoms.    PATIENT EDUCATION:  Education details: POC Person educated: Patient Education method: Explanation Education comprehension: verbalized understanding  HOME EXERCISE PROGRAM: Pt was recommended to press his tongue at the roof of his mouth to activate his anterior cervical muscles throughout the day.  Pt demonstrated and verbalized understanding.    Access Code: [email or web address] URL: [email or web address] Date: 05/10/2024 Prepared by: Suzzane Estes  Exercises - Seated Cervical Flexion AROM  - 2 x daily - 7 x weekly - 3 sets - 5 reps - Seated Transversus Abdominis Bracing  - 1 x daily - 7 x weekly - 2 -3 sets - 10 reps - 5 seconds hold - Seated Chin Tuck with Neck Elongation  - 2-3 x daily - 7 x weekly - 2-3 sets - 5 reps - Seated Cervical Sidebending AROM  - 3 x daily - 7 x weekly - 4 sets - 5 reps    ASSESSMENT:  CLINICAL IMPRESSION: Decreasing overall neck pain with slight improved cervical rotation AROM since initial evaluation. Difficulty with scapular control and mechanics with increased repetition. Continued working on improving thoracic extension to decrease cervical extension stress. Continued with gentle  L cervical side bending to decrease pressure to R UE nerves.   Fair tolerance to today's session, pt limited by pain. Pt will benefit from continued skilled physical therapy services to decrease pain, improve strength and function.     OBJECTIVE IMPAIRMENTS: Abnormal gait, decreased balance, decreased endurance, difficulty walking, decreased ROM, decreased strength, dizziness, impaired UE functional use, improper body mechanics, postural dysfunction, and pain.   ACTIVITY LIMITATIONS: carrying, lifting, bending, sitting, standing, squatting, sleeping, stairs, transfers, bed mobility, bathing, toileting, dressing, reach over head, hygiene/grooming, locomotion level, and caring for others  PARTICIPATION LIMITATIONS:   PERSONAL FACTORS: Age, Fitness, Past/current experiences, Time since onset of injury/illness/exacerbation, and 3+ comorbidities: DVT, depression, dyspnea, heart attack, HTN, osteoporosis, are also affecting patient's functional outcome.   REHAB POTENTIAL: Fair    CLINICAL DECISION MAKING: Stable/uncomplicated  EVALUATION COMPLEXITY: Low   GOALS: Goals reviewed with patient? Yes  SHORT TERM GOALS: Target date: 05/13/2024  Pt will be independent with his initial HEP to improve strength, decrease pain, improve ability to lift, look around as well as ambulate and perform standing tasks with less difficulty and pain. Baseline: Pt has not yet officially started his initial HEP (05/02/2024) Goal status: INITIAL    LONG TERM GOALS: Target date: 07/01/2024  Pt will have a decrease in neck pain to 5/10 or less at worst to promote ability to look around more comfortably.  Baseline: 9/10 neck pain at worst for the past 3 months (05/02/2024); 6/10 at most for 7 days (06/02/2024) Goal status: Progressing.   2.  Pt will have a decrease on low back pain to 4/10 or less at worst to promote ability to tolerate sitting, standing, walking and performing standing tasks more comfortably for his  back.  Baseline: 8/10 low back pain at worst for  Goal status: INITIAL  3.  Pt will improve his Neck Disability Index (NDI) score by at least 20 % as a demonstration of improved function.  Baseline: Neck Disability Index score: 41/50 (82%) (05/02/2024) Goal status: INITIAL  4.  Pt will improve his cervical rotation AROM to 50 degrees or more to promote ability to look around more comfortably. Baseline:  Active ROM A/PROM (deg) eval AROM (06/02/2024)  Right rotation 20 degrees with pain 30 degrees with pain  Left rotation 22 degrees with pain 35 degrees with pain   (05/02/2024)  Goal status: INITIAL  5.  Pt will improve B shoulder flexion AROM to 100 degrees or more to promote ability to reach as well as lift more comfortably.  Baseline:  Active ROM Right eval Left eval R (06/02/2024) L (06/02/2024)  Shoulder  flexion 55 (scaption) 55 (scaption) 44  (Scaption) 65  (Scaption)   (05/02/2024)  Goal status: ONGOING  6.  Pt will improve his shoulder flexion and lower trap strength by at least 1/2 MMT grade to promote ability to raise his arms as well as lift with less difficulty.  Baseline:  MMT Right eval Left eval  Shoulder flexion 3+ with neck and shoulder and arm pain 4- with pain at available range.   Lower trapezius (seated manually resisted) 3+ with neck pain 3+ with neck pain   (05/02/2024)   Goal status: INITIAL   PLAN:  PT FREQUENCY: 1-2x/week  PT DURATION: 8 weeks  PLANNED INTERVENTIONS: 97110-Therapeutic exercises, 97530- Therapeutic activity, 97112- Neuromuscular re-education, 97535- Self Care, 16109- Manual therapy, [phone]- Gait training, [phone]- Electrical stimulation (unattended), [phone]- Ionotophoresis 4mg /ml Dexamethasone , Patient/Family education, and Balance training.  PLAN FOR NEXT SESSION: posture, B scapular, anterior cervical, trunk and glute strengthening, mechanics, manual techniques, modalities PRN   Aryan Bello, PT, DPT 06/02/2024, 10:30 AM

## 2024-06-03 ENCOUNTER — Ambulatory Visit
Admission: RE | Admit: 2024-06-03 | Discharge: 2024-06-03 | Disposition: A | Discharge status: Home / Self Care | Source: Ambulatory Visit | Attending: Otolaryngology | Admitting: Otolaryngology

## 2024-06-03 DIAGNOSIS — H9042 Sensorineural hearing loss, unilateral, left ear, with unrestricted hearing on the contralateral side: Secondary | ICD-10-CM

## 2024-06-06 ENCOUNTER — Ambulatory Visit

## 2024-06-09 ENCOUNTER — Ambulatory Visit

## 2024-06-09 DIAGNOSIS — F332 Major depressive disorder, recurrent severe without psychotic features: Secondary | ICD-10-CM | POA: Diagnosis not present

## 2024-06-09 NOTE — Therapy (Signed)
 OUTPATIENT PHYSICAL THERAPY Treatment   Patient Name: Jonathon Snow MRN: [id number] DOB:11/07/1961, 63 y.o., male Today's Date: 06/09/2024  END OF SESSION:  PT End of Session - 06/09/24 0948     Visit Number 9    Number of Visits 17    Date for PT Re-Evaluation 07/01/24    PT Start Time 0948    PT Stop Time 0953    PT Time Calculation (min) 5 min    Activity Tolerance Patient tolerated treatment well;Patient limited by pain    Behavior During Therapy [facility] for tasks assessed/performed                   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   Overview:  superior sagittal sinus, left transverse sinus and cortical veins    Closed left hip fracture, initial encounter (HCC) 02/18/2023   COVID-19 virus infection 12/2020   Depression    DVT (deep venous thrombosis) (HCC)    Dyspnea    GERD (gastroesophageal reflux disease)    Head injury 11/05/2023   Heart attack (HCC) 11/05/2023   Heel spur, left 02/16/2019   Heel spur, right 02/16/2019   Hematoma of groin 11/08/2023   Herpes zoster infection of lumbar region 02/20/2020   History of DVT (deep vein thrombosis) 03/22/2020   History of kidney stones    Hyperlipidemia    Hypertension    Long term current use of systemic steroids 11/08/2020   Lupus    Lymphedema 10/07/2018   Morbid obesity (HCC)    Opiate abuse, episodic (HCC) 02/26/2018   Osteoporosis    Pain and swelling of right lower extremity 02/03/2023   Pneumonia    PONV (postoperative nausea and vomiting)    Postphlebitic syndrome with ulcer, 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   Presence of IVC filter 03/22/2020   Removed   Pulmonary embolism (HCC)    Renal disorder    Stage III   Severe episode of recurrent major depressive disorder,  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   Shock circulatory (HCC) 11/06/2023   STEMI (ST elevation myocardial infarction) (HCC) 11/05/2023   Past Surgical History:  Procedure Laterality Date   ANKLE SURGERY Right    BACK SURGERY     BRONCHIAL WASHINGS N/A 11/01/2021   Procedure: BRONCHIAL WASHINGS;  Surgeon: Erskin Hearing, MD;  Location: [facility] ORS;  Service: Thoracic;  Laterality: N/A;   COLONOSCOPY WITH PROPOFOL  N/A 05/28/2020   Procedure: COLONOSCOPY WITH PROPOFOL ;  Surgeon: Luke Salaam, MD;  Location: [facility] ENDOSCOPY;  Service: Endoscopy;  Laterality: N/A;   COLONOSCOPY WITH PROPOFOL  N/A 07/01/2023   Procedure: COLONOSCOPY WITH PROPOFOL ;  Surgeon: Luke Salaam, MD;  Location: [facility] ENDOSCOPY;  Service: Gastroenterology;  Laterality: N/A;   CORONARY ULTRASOUND/IVUS N/A 11/05/2023   Procedure: Coronary Ultrasound/IVUS;  Surgeon: Sammy Crisp, MD;  Location: [facility] INVASIVE CV LAB;  Service: Cardiovascular;  Laterality: N/A;   CORONARY/GRAFT ACUTE MI REVASCULARIZATION N/A 11/05/2023   Procedure: Coronary/Graft Acute MI Revascularization;  Surgeon: Sammy Crisp, MD;  Location: [facility] INVASIVE CV LAB;  Service: Cardiovascular;  Laterality: N/A;   CYST EXCISION  92 or 93    Liver cyst removal [facility]   FLEXIBLE BRONCHOSCOPY N/A 11/01/2021   Procedure: FLEXIBLE BRONCHOSCOPY;  Surgeon: Erskin Hearing, MD;  Location: [facility] ORS;  Service: Thoracic;  Laterality: N/A;  HIP PINNING,CANNULATED Left 02/19/2023   Procedure: PERCUTANEOUS FIXATION OF FEMORAL NECK;  Surgeon: Lorri Rota, MD;  Location: [facility] ORS;  Service: Orthopedics;  Laterality: Left;   I & D EXTREMITY Right 04/29/2017   Procedure: IRRIGATION AND DEBRIDEMENT EXTREMITY;  Surgeon: Gwyndolyn Lerner, MD;  Location: [facility] ORS;  Service: General;  Laterality: Right;   IRRIGATION AND DEBRIDEMENT ABSCESS Left 04/29/2017   Procedure: IRRIGATION AND DEBRIDEMENT Scrotal ABSCESS;  Surgeon: Gwyndolyn Lerner, MD;  Location: [facility] ORS;  Service:  General;  Laterality: Left;   LEFT HEART CATH AND CORONARY ANGIOGRAPHY N/A 11/05/2023   Procedure: LEFT HEART CATH AND CORONARY ANGIOGRAPHY;  Surgeon: Sammy Crisp, MD;  Location: [facility] INVASIVE CV LAB;  Service: Cardiovascular;  Laterality: N/A;   POLYPECTOMY  07/01/2023   Procedure: POLYPECTOMY;  Surgeon: Luke Salaam, MD;  Location: [facility] ENDOSCOPY;  Service: Gastroenterology;;   Patient Active Problem List   Diagnosis Date Noted   Cervical radicular pain 05/26/2024   History of thoracic spinal fusion (T7-T12) 05/26/2024   History of lumbar spinal fusion (L5-S1) 05/26/2024   Chronic pain syndrome 05/26/2024   Anxiety and depression 02/26/2024   Dyslipidemia 02/26/2024   Peripheral neuropathy 02/26/2024   Essential hypertension 02/26/2024   Vertigo 02/26/2024   Syncope 02/25/2024   Orthostatic hypotension 02/11/2024   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) 01/20/2024   Anxiety 12/14/2023   Overweight (BMI 25.0-29.9) 11/23/2023   Chronic heart failure with preserved ejection fraction (HFpEF) (HCC) 11/23/2023   BPH (benign prostatic hyperplasia) 11/23/2023   Unstable angina (HCC) 11/10/2023   Closed T10 spinal fracture (HCC) 11/06/2023   Closed tricolumnar fracture of thoracic vertebra (HCC) 11/06/2023   Thoracic spine instability 11/06/2023   Chronic bilateral low back pain with left-sided sciatica 07/07/2023   Neuropathy 07/07/2023   Hx of colonic polyps 07/01/2023   Adenomatous polyp of colon 07/01/2023   Erectile disorder 01/22/2023   Coronary artery disease involving native coronary artery of native heart without angina pectoris 09/09/2022   Varicose 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 01/03/2022   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) 09/18/2021   Prediabetes 11/08/2020   Stage 3a chronic kidney disease (HCC) 11/08/2020   Seasonal allergies 11/08/2020   Rhinosinusitis 11/08/2020   Anticoagulant disorder (HCC)  11/07/2020   Senile purpura (HCC) 11/07/2020   MDD (major depressive disorder), recurrent episode, mild (HCC) 09/12/2019   Other spondylosis with radiculopathy, lumbar region 02/16/2019   Osteoporosis 10/12/2018   Chronic venous insufficiency 10/07/2018   Lymphedema 10/07/2018   SLE glomerulonephritis syndrome, 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HCC) 03/03/2018   Syncope and collapse 12/29/2017   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) 10/29/2016   Hyperlipidemia LDL goal <70 01/15/2016   Uncontrolled hypertension 01/15/2016   Obesity (BMI 30.0-34.9) 01/15/2016   Long term current use of anticoagulant 10/04/2015   Systemic lupus erythematosus (HCC) 05/30/2015   COPD, moderate (HCC) 06/27/2014   Shortness of breath 06/27/2014   History of pulmonary embolism 12/11/2013   Nodule of right lung 12/11/2013   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   Cystic disease of liver 08/21/2013    PCP: Adeline Hone, PA-C   REFERRING PROVIDER: Jodeen Munch, MD  REFERRING DIAG: [id number] (ICD-10-CM) - Cervical radiculopathy M54.50,G89.29 (ICD-10-CM) - Chronic bilateral low back pain without sciatica  Rationale for Evaluation and Treatment: Rehabilitation  THERAPY DIAG:  Cervicalgia  Radiculopathy, cervical region  ONSET DATE: November 2024  SUBJECTIVE:  SUBJECTIVE STATEMENT: Feels like he is going to pass out. Does not want PT to call EMT. Has his phone.          PERTINENT HISTORY:  Cervicalgia, low back pain. Pt also reports R > L C5/C6 dermatome to elbow bilaterally. Pt has a difficult time turning his head and looking up. No neck surgery. Had Thoracic and lumbar spine fusion surgery. Had mid back surgery (thoracic) November 2024 due to his fall. Neck pain is  more recent. Neck and shoulder pain began November 2024 after his thoracic surgery. Usually sleeps on his L side but is currently difficulty to do secondary to B shoulder and neck pain, so pt is not sleeping well. Neck pain is the same since November 2024. Pt tends to have issues getting dizzy and passes out when he is standing and exerting himself  Vision gets blurry and splotchy. Room feels like it is waving around, pt is not walking on a stable surface, feels like he is walking on a boat and not having sea legs.   The IVC filter was removed.  Still has L LE DVT. Pt states taking Elequis. L LE DVT is still being treated.    PAIN:  Are you having pain? Yes: NPRS scale: /10 currently Pain location: neck (cervical paraspinals and upper trap) and B C5/C6 area to elbow Pain description: really really sore Aggravating factors: raising his arms up such as to reach, lifting with his arm, turning his head, looking up.  Relieving factors: Laying flat on his back on his bed with a pillow.    Pt also gets a stinging sensation at mid back around the T7 and above area, not all the time   Are you having pain? Yes: NPRS scale: /10 currently (pt sitting on a chair) Pain location: L low back pain; L L4 dermatome radiating symptoms to big toe.  Pain description: stinging, burning, ache Aggravating factors: sitting for long periods, about 15-20 minutes, trying to move around, bending lifting,   Relieving factors: Laying flat on his back, on the bed  PRECAUTIONS: dizzy and passes out when he is standing and exerting himself  L LE DVT, fall risk  RED FLAGS: Bowel or bladder incontinence: No and Cauda equina syndrome: No   WEIGHT BEARING RESTRICTIONS: No  FALLS:  Has patient fallen in last 6 months? Yes. Number of falls 2 (original fall was when pt had a heart attack November 2024 and hurt his back. February 22, 2024, pt passed out and fell into the bath tub.   LIVING ENVIRONMENT: Lives with: lives  alone Lives in: House/apartment Stairs: No Has following equipment at home: Single point cane and Environmental consultant - 2 wheeled  OCCUPATION: Not currently working  PLOF: Needs assistance with homemaking   PATIENT GOALS: be able to do everything better.   NEXT MD VISIT: Yes but does not know when.   OBJECTIVE:  Note: Objective measures were completed at Evaluation unless otherwise noted.  DIAGNOSTIC FINDINGS:  MR CERVICAL SPINE WO CONTRAST 02/26/2024  Narrative & Impression  CLINICAL DATA:  Myelopathy, acute, cervical spine   EXAM: MRI CERVICAL SPINE WITHOUT CONTRAST   TECHNIQUE: Multiplanar, multisequence MR imaging of the cervical spine was performed. No intravenous contrast was administered.   COMPARISON:  CT of the cervical spine November 05, 2023.   FINDINGS: Alignment: No substantial sagittal subluxation.   Vertebrae: No evidence of acute fracture, evidence of discitis, or suspicious bone lesion. Benign vertebral venous malformation at T3. Mild T1-T2 height  loss, unchanged.   Cord: Normal cord signal.  Neck   Posterior Fossa, vertebral arteries, paraspinal tissues: Visualized vertebral artery flow voids are maintained. No paraspinal edema.   Disc levels:   C2-C3: No significant disc protrusion, foraminal stenosis, or canal stenosis.   C3-C4: No significant disc protrusion, foraminal stenosis, or canal stenosis.   C4-C5: Right eccentric posterior disc osteophyte complex with right greater than left facet and uncovertebral hypertrophy. Resulting mild right greater than left foraminal stenosis. Mild canal stenosis.   C5-C6: Posterior disc osteophyte complex ligamentum flavum thickening and right greater than left facet and uncovertebral hypertrophy. Resulting mild-to-moderate bilateral foraminal stenosis. Mild to moderate canal stenosis.   C6-C7: Posterior disc osteophyte complex with bilateral facet and uncovertebral hypertrophy. Resulting moderate bilateral  foraminal stenosis. Mild canal stenosis.   C7-T1: No significant disc protrusion, foraminal stenosis, or canal stenosis.   IMPRESSION: 1. 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 2. At C5-C6, mild to moderate canal and bilateral foraminal stenosis. 3. At C4-C5, mild canal and bilateral foraminal stenosis.     Electronically Signed   By: Stevenson Elbe M.D.   On: 02/27/2024 02:42         PATIENT SURVEYS:  Neck Disability Index score: 41/50 (82%) (05/02/2024)  COGNITION: Overall cognitive status: Within functional limits for tasks assessed     SENSATION:   MUSCLE LENGTH:   POSTURE:   PALPATION: TTP to posterior neck   CERVICAL ROM:   Active ROM A/PROM (deg) eval  Flexion WFL with posterior neck pain  Extension Very limited with pain, movement preference around C5/C6 area  Right lateral flexion Very limited with pain  Left lateral flexion Very limited with pain  Right rotation 20 degrees with pain  Left rotation 22 degrees with pain   (Blank rows = not tested)  UPPER EXTREMITY MMT:  MMT Right eval Left eval  Shoulder flexion 3+ with neck and shoulder and arm pain 4- with pain at available range.   Shoulder extension    Shoulder abduction    Shoulder adduction    Shoulder extension    Shoulder internal rotation    Shoulder external rotation    Middle trapezius    Lower trapezius (seated manually resisted) 3+ with neck pain 3+ with neck pain  Elbow flexion    Elbow extension    Wrist flexion    Wrist extension    Wrist ulnar deviation    Wrist radial deviation    Wrist pronation    Wrist supination    Grip strength     (Blank rows = not tested)  UPPER EXTREMITY ROM:  Active ROM Right eval Left eval  Shoulder flexion 55 (scaption) 55 (scaption)  Shoulder extension    Shoulder abduction    Shoulder adduction    Shoulder extension    Shoulder internal rotation    Shoulder external rotation    Elbow  flexion    Elbow extension    Wrist flexion    Wrist extension    Wrist ulnar deviation    Wrist radial deviation    Wrist pronation    Wrist supination     (Blank rows = not tested)    LUMBAR ROM:   AROM eval  Flexion   Extension   Right lateral flexion   Left lateral flexion   Right rotation   Left rotation    (Blank rows = not tested)  LOWER EXTREMITY ROM:     Passive  Right eval Left  eval  Hip flexion    Hip extension    Hip abduction    Hip adduction    Hip internal rotation    Hip external rotation    Knee flexion    Knee extension    Ankle dorsiflexion    Ankle plantarflexion    Ankle inversion    Ankle eversion     (Blank rows = not tested)  LOWER EXTREMITY MMT:    MMT Right eval Left eval  Hip flexion    Hip extension    Hip abduction    Hip adduction    Hip internal rotation    Hip external rotation    Knee flexion    Knee extension    Ankle dorsiflexion    Ankle plantarflexion    Ankle inversion    Ankle eversion     (Blank rows = not tested)  LUMBAR SPECIAL TESTS:    FUNCTIONAL TESTS:    GAIT: Distance walked: 30 ft Assistive device utilized: None Level of assistance: SBA Comments: antalgic, decreased stance R LE, forward flexed, decreased gait speed   TREATMENT DATE: 06/09/2024                                                                                                                                  Neuromuscular re education  At start of session: Blood pressure L arm sitting, mechanically taken, normal cuff: 95/57, HR 77  100 % SpO2  Pt states feels like he is going to pass out. Does not want PT to call the ambulance. Will be able to make it home. Has his cell phone.     PATIENT EDUCATION:  Education details: POC Person educated: Patient Education method: Explanation Education comprehension: verbalized understanding  HOME EXERCISE PROGRAM: Pt was recommended to press his tongue at the roof of his mouth  to activate his anterior cervical muscles throughout the day. Pt demonstrated and verbalized understanding.    Access Code: [email or web address] URL: [email or web address] Date: 05/10/2024 Prepared by: Suzzane Estes  Exercises - Seated Cervical Flexion AROM  - 2 x daily - 7 x weekly - 3 sets - 5 reps - Seated Transversus Abdominis Bracing  - 1 x daily - 7 x weekly - 2 -3 sets - 10 reps - 5 seconds hold - Seated Chin Tuck with Neck Elongation  - 2-3 x daily - 7 x weekly - 2-3 sets - 5 reps - Seated Cervical Sidebending AROM  - 3 x daily - 7 x weekly - 4 sets - 5 reps    ASSESSMENT:  CLINICAL IMPRESSION: Physical therapy treatment not performed today secondary to low blood pressure levels, hx of recent syncope and reports feeling of passing out. Pt was recommended to contact his doctor about it. Pt states that he is. Pt states that he does not want PT to call the ambulance/EMT and will be able to make it back home. Has his cell phone with him.  Pt is aware of person, place, time, and situation.    OBJECTIVE IMPAIRMENTS: Abnormal gait, decreased balance, decreased endurance, difficulty walking, decreased ROM, decreased strength, dizziness, impaired UE functional use, improper body mechanics, postural dysfunction, and pain.   ACTIVITY LIMITATIONS: carrying, lifting, bending, sitting, standing, squatting, sleeping, stairs, transfers, bed mobility, bathing, toileting, dressing, reach over head, hygiene/grooming, locomotion level, and caring for others  PARTICIPATION LIMITATIONS:   PERSONAL FACTORS: Age, Fitness, Past/current experiences, Time since onset of injury/illness/exacerbation, and 3+ comorbidities: DVT, depression, dyspnea, heart attack, HTN, osteoporosis, are also affecting patient's functional outcome.   REHAB POTENTIAL: Fair    CLINICAL DECISION MAKING: Stable/uncomplicated  EVALUATION COMPLEXITY: Low   GOALS: Goals reviewed with patient? Yes  SHORT TERM GOALS: Target  date: 05/13/2024  Pt will be independent with his initial HEP to improve strength, decrease pain, improve ability to lift, look around as well as ambulate and perform standing tasks with less difficulty and pain. Baseline: Pt has not yet officially started his initial HEP (05/02/2024) Goal status: INITIAL    LONG TERM GOALS: Target date: 07/01/2024  Pt will have a decrease in neck pain to 5/10 or less at worst to promote ability to look around more comfortably.  Baseline: 9/10 neck pain at worst for the past 3 months (05/02/2024); 6/10 at most for 7 days (06/02/2024) Goal status: Progressing.   2.  Pt will have a decrease on low back pain to 4/10 or less at worst to promote ability to tolerate sitting, standing, walking and performing standing tasks more comfortably for his back.  Baseline: 8/10 low back pain at worst for  Goal status: INITIAL  3.  Pt will improve his Neck Disability Index (NDI) score by at least 20 % as a demonstration of improved function.  Baseline: Neck Disability Index score: 41/50 (82%) (05/02/2024) Goal status: INITIAL  4.  Pt will improve his cervical rotation AROM to 50 degrees or more to promote ability to look around more comfortably. Baseline:  Active ROM A/PROM (deg) eval AROM (06/02/2024)  Right rotation 20 degrees with pain 30 degrees with pain  Left rotation 22 degrees with pain 35 degrees with pain   (05/02/2024)  Goal status: INITIAL  5.  Pt will improve B shoulder flexion AROM to 100 degrees or more to promote ability to reach as well as lift more comfortably.  Baseline:  Active ROM Right eval Left eval R (06/02/2024) L (06/02/2024)  Shoulder flexion 55 (scaption) 55 (scaption) 44  (Scaption) 65  (Scaption)   (05/02/2024)  Goal status: ONGOING  6.  Pt will improve his shoulder flexion and lower trap strength by at least 1/2 MMT grade to promote ability to raise his arms as well as lift with less difficulty.  Baseline:  MMT Right eval  Left eval  Shoulder flexion 3+ with neck and shoulder and arm pain 4- with pain at available range.   Lower trapezius (seated manually resisted) 3+ with neck pain 3+ with neck pain   (05/02/2024)   Goal status: INITIAL   PLAN:  PT FREQUENCY: 1-2x/week  PT DURATION: 8 weeks  PLANNED INTERVENTIONS: 97110-Therapeutic exercises, 97530- Therapeutic activity, [id number]- Neuromuscular re-education, 97535- Self Care, [id number]- Manual therapy, [id number]- Gait training, [phone]- Electrical stimulation (unattended), [phone]- Ionotophoresis 4mg /ml Dexamethasone , Patient/Family education, and Balance training.  PLAN FOR NEXT SESSION: posture, B scapular, anterior cervical, trunk and glute strengthening, mechanics, manual techniques, modalities PRN   Lesia Monica, PT, DPT 06/09/2024, 10:02 AM

## 2024-06-13 ENCOUNTER — Ambulatory Visit: Admitting: Dermatology

## 2024-06-13 ENCOUNTER — Encounter: Payer: Self-pay | Admitting: Dermatology

## 2024-06-13 ENCOUNTER — Ambulatory Visit

## 2024-06-13 DIAGNOSIS — B078 Other viral warts: Secondary | ICD-10-CM | POA: Diagnosis not present

## 2024-06-13 DIAGNOSIS — Z7189 Other specified counseling: Secondary | ICD-10-CM

## 2024-06-13 DIAGNOSIS — L820 Inflamed seborrheic keratosis: Secondary | ICD-10-CM

## 2024-06-13 DIAGNOSIS — Z79899 Other long term (current) drug therapy: Secondary | ICD-10-CM

## 2024-06-13 NOTE — Progress Notes (Unsigned)
 Follow-Up Visit   Subjective  Jonathon Snow is a 63 y.o. male who presents for the following: Warts of the fingers and right forearm. He has has warts treated in the past with cryotherapy, squaric acid, cantharidin, and Candida injections.   The following portions of the chart were reviewed this encounter and updated as appropriate: medications, allergies, medical history  Review of Systems:  No other skin or systemic complaints except as noted in HPI or Assessment and Plan.  Objective  Well appearing patient in no apparent distress; mood and affect are within normal limits.  Areas Examined: Face, arms, hands, fingers  Relevant physical exam findings are noted in the Assessment and Plan.  R index finger x2, R palm x1 (3) Verrucous papules -- Discussed viral etiology and contagion.  Right Forearm x1 Erythematous keratotic or waxy stuck-on papule or plaque.  Assessment & Plan   OTHER VIRAL WARTS (3) R index finger x2, R palm x1 (3) Viral Wart (HPV) Counseling  Discussed viral / HPV (Human Papilloma Virus) etiology and risk of spread /infectivity to other areas of body as well as to other people.  Multiple treatments and methods may be required to clear warts and it is possible treatment may not be successful.  Treatment risks include discoloration; scarring and there is still potential for wart recurrence. Destruction of lesion - R index finger x2, R palm x1 (3) Complexity: simple   Destruction method: cryotherapy   Informed consent: discussed and consent obtained   Timeout:  patient name, date of birth, surgical site, and procedure verified Lesion destroyed using liquid nitrogen: Yes   Region frozen until ice ball extended beyond lesion: Yes   Outcome: patient tolerated procedure well with no complications   Post-procedure details: wound care instructions given   Additional details:  Prior to procedure, discussed risks of blister formation, small wound, skin dyspigmentation,  or rare scar following cryotherapy. Recommend Vaseline ointment to treated areas while healing.   Destruction of lesion - R index finger x2, R palm x1 (3)  Destruction method: chemical removal   Informed consent: discussed and consent obtained   Timeout:  patient name, date of birth, surgical site, and procedure verified Chemical destruction method: cantharidin   Procedure instructions: patient instructed to wash and dry area   Procedure instructions comment:  Was off in 4 hours Outcome: patient tolerated procedure well with no complications   Post-procedure details: wound care instructions given   Additional details:  Squaric Acid may be used as immunotherapy to treat warts and other skin conditions. Squaric Acid 3% applied to warts today. Areas for treated should be left covered for 4-12 hours, then areas should be uncovered and washed off thoroughly with soap and water  and rinsed. The skin where Squaric Acid was applied may get red and irritated.  This is the desired result in order to get a better resolution of warts.  It may require several treatments to achieve desired results. Prior to application reviewed risk of inflammation and irritation.  Cantharidin Plus is a blistering agent that comes from a beetle.  It needs to be washed off in about 4 hours after application.  Although it is painless when applied in office, it may cause symptoms of mild pain and burning several hours later.  Treated areas will swell and turn red, and blisters may form.  Vaseline and a bandaid may be applied until wound has healed.  Once healed, the skin may remain temporarily discolored.  It can take weeks  to months for pigmentation to return to normal.  Advised to wash off with soap and water  in 4 hours or sooner if it becomes tender before then.   INFLAMED SEBORRHEIC KERATOSIS Right Forearm x1 Symptomatic, irritating, patient would like treated. Destruction of lesion - Right Forearm x1 Complexity: simple    Destruction method: cryotherapy   Informed consent: discussed and consent obtained   Timeout:  patient name, date of birth, surgical site, and procedure verified Lesion destroyed using liquid nitrogen: Yes   Region frozen until ice ball extended beyond lesion: Yes   Outcome: patient tolerated procedure well with no complications   Post-procedure details: wound care instructions given   Additional details:  Prior to procedure, discussed risks of blister formation, small wound, skin dyspigmentation, or rare scar following cryotherapy. Recommend Vaseline ointment to treated areas while healing.    Return for Wart Follow UP in  4-6 weeks.  I, Jill Parcell, CMA, am acting as scribe for Alm Rhyme, MD.   Documentation: I have reviewed the above documentation for accuracy and completeness, and I agree with the above.  Alm Rhyme, MD

## 2024-06-13 NOTE — Patient Instructions (Addendum)
 Viral Wart (HPV) Counseling  Discussed viral / HPV (Human Papilloma Virus) etiology and risk of spread /infectivity to other areas of body as well as to other people.  Multiple treatments and methods may be required to clear warts and it is possible treatment may not be successful.  Treatment risks include discoloration; scarring and there is still potential for wart recurrence.     Cryotherapy Aftercare  Wash gently with soap and water  everyday.   Apply Vaseline and Band-Aid daily until healed.    Cantharidin Plus is a blistering agent that comes from a beetle.  It needs to be washed off in about 4 hours after application.  Although it is painless when applied in office, it may cause symptoms of mild pain and burning several hours later.  Treated areas will swell and turn red, and blisters may form.  Vaseline and a bandaid may be applied until wound has healed.  Once healed, the skin may remain temporarily discolored.  It can take weeks to months for pigmentation to return to normal.  Advised to wash off with soap and water  in 4 hours or sooner if it becomes tender before then.     Instructions for After In-Office Application of Cantharidin  1. This is a strong medicine; please follow ALL instructions.  2. Gently wash off with soap and water  in four hours or sooner s directed by your physician.  3. **WARNING** this medicine can cause severe blistering, blood blisters, infection, and/or scarring if it is not washed off as directed.  4. Your progress will be rechecked in 1-2 months; call sooner if there are any questions or problems.    Due to recent changes in healthcare laws, you may see results of your pathology and/or laboratory studies on MyChart before the doctors have had a chance to review them. We understand that in some cases there may be results that are confusing or concerning to you. Please understand that not all results are received at the same time and often the doctors  may need to interpret multiple results in order to provide you with the best plan of care or course of treatment. Therefore, we ask that you please give us  2 business days to thoroughly review all your results before contacting the office for clarification. Should we see a critical lab result, you will be contacted sooner.   If You Need Anything After Your Visit  If you have any questions or concerns for your doctor, please call our main line at [phone] and press option 4 to reach your doctor's medical assistant. If no one answers, please leave a voicemail as directed and we will return your call as soon as possible. Messages left after 4 pm will be answered the following business day.   You may also send us  a message via MyChart. We typically respond to MyChart messages within 1-2 business days.  For prescription refills, please ask your pharmacy to contact our office. Our fax number is [phone].  If you have an urgent issue when the clinic is closed that cannot wait until the next business day, you can page your doctor at the number below.    Please note that while we do our best to be available for urgent issues outside of office hours, we are not available 24/7.   If you have an urgent issue and are unable to reach us , you may choose to seek medical care at your doctor's office, retail clinic, urgent care center, or emergency room.  If you have a medical emergency, please immediately call 911 or go to the emergency department.  Pager Numbers  - Dr. Hester: [phone]  - Dr. Jackquline: [phone]  - Dr. Claudene: [phone]   In the event of inclement weather, please call our main line at [phone] for an update on the status of any delays or closures.  Dermatology Medication Tips: Please keep the boxes that topical medications come in in order to help keep track of the instructions about where and how to use these. Pharmacies typically print the medication  instructions only on the boxes and not directly on the medication tubes.   If your medication is too expensive, please contact our office at [phone] option 4 or send us  a message through MyChart.   We are unable to tell what your co-pay for medications will be in advance as this is different depending on your insurance coverage. However, we may be able to find a substitute medication at lower cost or fill out paperwork to get insurance to cover a needed medication.   If a prior authorization is required to get your medication covered by your insurance company, please allow us  1-2 business days to complete this process.  Drug prices often vary depending on where the prescription is filled and some pharmacies may offer cheaper prices.  The website www.goodrx.com contains coupons for medications through different pharmacies. The prices here do not account for what the cost may be with help from insurance (it may be cheaper with your insurance), but the website can give you the price if you did not use any insurance.  - You can print the associated coupon and take it with your prescription to the pharmacy.  - You may also stop by our office during regular business hours and pick up a GoodRx coupon card.  - If you need your prescription sent electronically to a different pharmacy, notify our office through [facility] or by phone at [phone] option 4.     Si Usted Necesita Algo Despus de Su Visita  Tambin puede enviarnos un mensaje a travs de Clinical cytogeneticist. Por lo general respondemos a los mensajes de MyChart en el transcurso de 1 a 2 das hbiles.  Para renovar recetas, por favor pida a su farmacia que se ponga en contacto con nuestra oficina. Randi lakes de fax es [place] [phone].  Si tiene un asunto urgente cuando la clnica est cerrada y que no puede esperar hasta el siguiente da hbil, puede llamar/localizar a su doctor(a) al nmero que aparece a continuacin.   Por  favor, tenga en cuenta que aunque hacemos todo lo posible para estar disponibles para asuntos urgentes fuera del horario de [place], no estamos disponibles las 24 horas del da, los 7 [address]  [address] de la [place].   Si tiene un problema urgente y no puede comunicarse con nosotros, puede optar por buscar atencin mdica  en el consultorio de su doctor(a), en una clnica privada, en un centro de atencin urgente o en una sala de emergencias.  Si tiene Engineer, drilling, por favor llame inmediatamente al 911 o vaya a la sala de emergencias.  Nmeros de bper  - Dr. Hester: [phone]  - Dra. Jackquline: [phone]  - Dr. Claudene: [phone]   En caso de inclemencias del tiempo, por favor llame a landry capes principal al [phone] para una actualizacin sobre el [place] de cualquier retraso o cierre.  Consejos para la medicacin en dermatologa: Por favor, guarde las cajas en las que vienen los  medicamentos de uso tpico para ayudarle a seguir las [employer] dnde y cmo usarlos. Las farmacias generalmente imprimen las instrucciones del medicamento slo en las cajas y no directamente en los tubos del [place].   Si su medicamento es muy caro, por favor, pngase en contacto con landry rieger llamando al [phone] y presione la opcin 4 o envenos un mensaje a travs de Clinical cytogeneticist.   No podemos decirle cul ser su copago por los medicamentos por adelantado ya que esto es diferente dependiendo de la cobertura de su seguro. Sin embargo, es posible que podamos encontrar un medicamento sustituto a Audiological scientist un formulario para que el seguro cubra el medicamento que se considera necesario.   Si se requiere una autorizacin previa para que su compaa de seguros malta su medicamento, por favor permtanos de 1 a 2 das hbiles para completar este proceso.  Los precios de los medicamentos varan con frecuencia dependiendo del Environmental consultant de dnde se surte la receta y alguna farmacias  pueden ofrecer precios ms baratos.  El sitio web www.goodrx.com tiene cupones para medicamentos de Health and safety inspector. Los precios aqu no tienen en cuenta lo que podra costar con la ayuda del seguro (puede ser ms barato con su seguro), pero el sitio web puede darle el precio si no utiliz Tourist information centre manager.  - Puede imprimir el cupn correspondiente y llevarlo con su receta a la farmacia.  - Tambin puede pasar por nuestra oficina durante el horario de atencin regular y Education officer, museum una tarjeta de cupones de GoodRx.  - Si necesita que su receta se enve electrnicamente a una farmacia diferente, informe a nuestra oficina a travs de MyChart de [place] o por telfono llamando al [phone] y presione la opcin 4.

## 2024-06-14 ENCOUNTER — Ambulatory Visit

## 2024-06-14 ENCOUNTER — Encounter: Payer: Self-pay | Admitting: Dermatology

## 2024-06-14 DIAGNOSIS — M5459 Other low back pain: Secondary | ICD-10-CM

## 2024-06-14 DIAGNOSIS — M542 Cervicalgia: Secondary | ICD-10-CM | POA: Diagnosis not present

## 2024-06-14 DIAGNOSIS — M5412 Radiculopathy, cervical region: Secondary | ICD-10-CM

## 2024-06-14 DIAGNOSIS — R262 Difficulty in walking, not elsewhere classified: Secondary | ICD-10-CM

## 2024-06-14 NOTE — Therapy (Signed)
 OUTPATIENT PHYSICAL THERAPY TREATMENT Physical Therapy Progress Note Dates of reporting period  05/02/24   to   06/14/24    Patient Name: Jonathon Snow MRN: [id number] DOB:12/28/1960, 63 y.o., male Today's Date: 06/14/2024  END OF SESSION:  PT End of Session - 06/14/24 1356     Visit Number 10    Number of Visits 17    Date for PT Re-Evaluation 07/01/24    PT Start Time 1350    PT Stop Time 1428    PT Time Calculation (min) 38 min    Activity Tolerance Patient tolerated treatment well;No increased pain;Patient limited by fatigue    Behavior During Therapy [facility] for tasks assessed/performed                   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   Overview:  superior sagittal sinus, left transverse sinus and cortical veins    Closed left hip fracture, initial encounter (HCC) 02/18/2023   COVID-19 virus infection 12/2020   Depression    DVT (deep venous thrombosis) (HCC)    Dyspnea    GERD (gastroesophageal reflux disease)    Head injury 11/05/2023   Heart attack (HCC) 11/05/2023   Heel spur, left 02/16/2019   Heel spur, right 02/16/2019   Hematoma of groin 11/08/2023   Herpes zoster infection of lumbar region 02/20/2020   History of DVT (deep vein thrombosis) 03/22/2020   History of kidney stones    Hyperlipidemia    Hypertension    Long term current use of systemic steroids 11/08/2020   Lupus    Lymphedema 10/07/2018   Morbid obesity (HCC)    Opiate abuse, episodic (HCC) 02/26/2018   Osteoporosis    Pain and swelling of right lower extremity 02/03/2023   Pneumonia    PONV (postoperative nausea and vomiting)    Postphlebitic syndrome with ulcer, 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   Presence of IVC filter 03/22/2020   Removed   Pulmonary  embolism (HCC)    Renal disorder    Stage III   Severe episode of recurrent major depressive disorder, 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   Shock circulatory (HCC) 11/06/2023   STEMI (ST elevation myocardial infarction) (HCC) 11/05/2023   Past Surgical History:  Procedure Laterality Date   ANKLE SURGERY Right    BACK SURGERY     BRONCHIAL WASHINGS N/A 11/01/2021   Procedure: BRONCHIAL WASHINGS;  Surgeon: Parris Manna, MD;  Location: [facility] ORS;  Service: Thoracic;  Laterality: N/A;   COLONOSCOPY WITH PROPOFOL  N/A 05/28/2020   Procedure: COLONOSCOPY WITH PROPOFOL ;  Surgeon: Therisa Bi, MD;  Location: [facility] ENDOSCOPY;  Service: Endoscopy;  Laterality: N/A;   COLONOSCOPY WITH PROPOFOL  N/A 07/01/2023   Procedure: COLONOSCOPY WITH PROPOFOL ;  Surgeon: Therisa Bi, MD;  Location: [facility] ENDOSCOPY;  Service: Gastroenterology;  Laterality: N/A;   CORONARY ULTRASOUND/IVUS N/A 11/05/2023   Procedure: Coronary Ultrasound/IVUS;  Surgeon: Mady Bruckner, MD;  Location: [facility] INVASIVE CV LAB;  Service: Cardiovascular;  Laterality: N/A;   CORONARY/GRAFT ACUTE MI REVASCULARIZATION N/A 11/05/2023   Procedure: Coronary/Graft Acute MI Revascularization;  Surgeon: Mady Bruckner, MD;  Location: [facility] INVASIVE CV LAB;  Service: Cardiovascular;  Laterality: N/A;   CYST EXCISION  92 or 93    Liver cyst removal [facility]   FLEXIBLE BRONCHOSCOPY N/A 11/01/2021  Procedure: FLEXIBLE BRONCHOSCOPY;  Surgeon: Parris Manna, MD;  Location: [facility] ORS;  Service: Thoracic;  Laterality: N/A;   HIP PINNING,CANNULATED Left 02/19/2023   Procedure: PERCUTANEOUS FIXATION OF FEMORAL NECK;  Surgeon: Tobie Priest, MD;  Location: [facility] ORS;  Service: Orthopedics;  Laterality: Left;   I & D EXTREMITY Right 04/29/2017   Procedure: IRRIGATION AND DEBRIDEMENT EXTREMITY;  Surgeon: Shelva Dunnings, MD;  Location: [facility] ORS;  Service: General;  Laterality: Right;   IRRIGATION AND DEBRIDEMENT ABSCESS Left 04/29/2017   Procedure:  IRRIGATION AND DEBRIDEMENT Scrotal ABSCESS;  Surgeon: Shelva Dunnings, MD;  Location: [facility] ORS;  Service: General;  Laterality: Left;   LEFT HEART CATH AND CORONARY ANGIOGRAPHY N/A 11/05/2023   Procedure: LEFT HEART CATH AND CORONARY ANGIOGRAPHY;  Surgeon: Mady Bruckner, MD;  Location: [facility] INVASIVE CV LAB;  Service: Cardiovascular;  Laterality: N/A;   POLYPECTOMY  07/01/2023   Procedure: POLYPECTOMY;  Surgeon: Therisa Bi, MD;  Location: [facility] ENDOSCOPY;  Service: Gastroenterology;;   Patient Active Problem List   Diagnosis Date Noted   Cervical radicular pain 05/26/2024   History of thoracic spinal fusion (T7-T12) 05/26/2024   History of lumbar spinal fusion (L5-S1) 05/26/2024   Chronic pain syndrome 05/26/2024   Anxiety and depression 02/26/2024   Dyslipidemia 02/26/2024   Peripheral neuropathy 02/26/2024   Essential hypertension 02/26/2024   Vertigo 02/26/2024   Syncope 02/25/2024   Orthostatic hypotension 02/11/2024   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) 01/20/2024   Anxiety 12/14/2023   Overweight (BMI 25.0-29.9) 11/23/2023   Chronic heart failure with preserved ejection fraction (HFpEF) (HCC) 11/23/2023   BPH (benign prostatic hyperplasia) 11/23/2023   Unstable angina (HCC) 11/10/2023   Closed T10 spinal fracture (HCC) 11/06/2023   Closed tricolumnar fracture of thoracic vertebra (HCC) 11/06/2023   Thoracic spine instability 11/06/2023   Chronic bilateral low back pain with left-sided sciatica 07/07/2023   Neuropathy 07/07/2023   Hx of colonic polyps 07/01/2023   Adenomatous polyp of colon 07/01/2023   Erectile disorder 01/22/2023   Coronary artery disease involving native coronary artery of native heart without angina pectoris 09/09/2022   Varicose 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 01/03/2022   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) 09/18/2021   Prediabetes 11/08/2020   Stage 3a chronic kidney disease (HCC)  11/08/2020   Seasonal allergies 11/08/2020   Rhinosinusitis 11/08/2020   Anticoagulant disorder (HCC) 11/07/2020   Senile purpura (HCC) 11/07/2020   MDD (major depressive disorder), recurrent episode, mild (HCC) 09/12/2019   Other spondylosis with radiculopathy, lumbar region 02/16/2019   Osteoporosis 10/12/2018   Chronic venous insufficiency 10/07/2018   Lymphedema 10/07/2018   SLE glomerulonephritis syndrome, 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HCC) 03/03/2018   Syncope and collapse 12/29/2017   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) 10/29/2016   Hyperlipidemia LDL goal <70 01/15/2016   Uncontrolled hypertension 01/15/2016   Obesity (BMI 30.0-34.9) 01/15/2016   Long term current use of anticoagulant 10/04/2015   Systemic lupus erythematosus (HCC) 05/30/2015   COPD, moderate (HCC) 06/27/2014   Shortness of breath 06/27/2014   History of pulmonary embolism 12/11/2013   Nodule of right lung 12/11/2013   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   Cystic disease of liver 08/21/2013    PCP: [name], PA-C REFERRING PROVIDER: Clois Fret, MD REFERRING DIAG: M54.12 (ICD-10-CM) - Cervical radiculopathy M54.50,G89.29 (ICD-10-CM) - Chronic bilateral low back pain without sciatica  Rationale for Evaluation and Treatment: Rehabilitation  THERAPY DIAG:  Cervicalgia  Radiculopathy, cervical region  Other low back pain  Difficulty in walking, not elsewhere classified  ONSET DATE: November 2024  SUBJECTIVE:                                                                                                                                                                                           SUBJECTIVE STATEMENT:  Pt went home after last session, continued to feel quite limited and dizzy, he rested. He continues to feel dizzy and weak today. Pt says overall, difficult to remark on neck pain progress thus far. Pt says he has more good  days than he used to, but his bad days are as severe and last as long. Pt reports continued work on [employer].   PERTINENT HISTORY:  Cervicalgia, low back pain. Pt also reports R > L C5/C6 dermatome to elbow bilaterally. Pt has a difficult time turning his head and looking up. No neck surgery. Had Thoracic and lumbar spine fusion surgery. Had mid back surgery (thoracic) November 2024 due to his fall. Neck pain is more recent. Neck and shoulder pain began November 2024 after his thoracic surgery. Usually sleeps on his L side but is currently difficulty to do secondary to B shoulder and neck pain, so pt is not sleeping well. Neck pain is the same since November 2024. Pt tends to have issues getting dizzy and passes out when he is standing and exerting himself  Vision gets blurry and splotchy. Room feels like it is waving around, pt is not walking on a stable surface, feels like he is walking on a boat and not having sea legs.   The IVC filter was removed.  Still has L LE DVT. Pt states taking Elequis. L LE DVT is still being treated.    PAIN:  Are you having pain? 5/10 pain in bilat UT area of neck and shoulders Rt> Lt.   PRECAUTIONS:  -dizzy and passes out when he is standing and exerting himself; chronic orthostatic hypotension; Lt LE DVT, fall risk  RED FLAGS: Bowel or bladder incontinence: No and Cauda equina syndrome: No   WEIGHT BEARING RESTRICTIONS: No  FALLS:  Has patient fallen in last 6 months? Yes. Number of falls 2 (original fall was when pt had a heart attack November 2024 and hurt his back. February 22, 2024, pt passed out and fell into the bath tub.   LIVING ENVIRONMENT: Lives with: lives alone Lives in: House/apartment Stairs: No Has following equipment at home: Single point cane and Environmental consultant - 2 wheeled  OCCUPATION: Not currently working  PLOF: Needs assistance with homemaking   PATIENT GOALS: be able to  do everything better.   NEXT MD VISIT: Yes but does not know when.    OBJECTIVE:  Note: Objective measures were completed at Evaluation unless otherwise noted.  DIAGNOSTIC FINDINGS:  MR CERVICAL SPINE WO CONTRAST 02/26/2024  Narrative & Impression  CLINICAL DATA:  Myelopathy, acute, cervical spine   EXAM: MRI CERVICAL SPINE WITHOUT CONTRAST   TECHNIQUE: Multiplanar, multisequence MR imaging of the cervical spine was performed. No intravenous contrast was administered.   COMPARISON:  CT of the cervical spine November 05, 2023.   FINDINGS: Alignment: No substantial sagittal subluxation.   Vertebrae: No evidence of acute fracture, evidence of discitis, or suspicious bone lesion. Benign vertebral venous malformation at T3. Mild T1-T2 height loss, unchanged.   Cord: Normal cord signal.  Neck   Posterior Fossa, vertebral arteries, paraspinal tissues: Visualized vertebral artery flow voids are maintained. No paraspinal edema.   Disc levels:   C2-C3: No significant disc protrusion, foraminal stenosis, or canal stenosis.   C3-C4: No significant disc protrusion, foraminal stenosis, or canal stenosis.   C4-C5: Right eccentric posterior disc osteophyte complex with right greater than left facet and uncovertebral hypertrophy. Resulting mild right greater than left foraminal stenosis. Mild canal stenosis.   C5-C6: Posterior disc osteophyte complex ligamentum flavum thickening and right greater than left facet and uncovertebral hypertrophy. Resulting mild-to-moderate bilateral foraminal stenosis. Mild to moderate canal stenosis.   C6-C7: Posterior disc osteophyte complex with bilateral facet and uncovertebral hypertrophy. Resulting moderate bilateral foraminal stenosis. Mild canal stenosis.   C7-T1: No significant disc protrusion, foraminal stenosis, or canal stenosis.   IMPRESSION: 1. 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 2. At C5-C6, mild to moderate canal and bilateral foraminal stenosis. 3. At C4-C5,  mild canal and bilateral foraminal stenosis.     Electronically Signed   By: Gilmore GORMAN Molt M.D.   On: 02/27/2024 02:42         PATIENT SURVEYS:  Neck Disability Index score: 41/50 (82%) (05/02/2024)  COGNITION: Overall cognitive status: Within functional limits for tasks assessed     SENSATION:   MUSCLE LENGTH:   POSTURE:   PALPATION: TTP to posterior neck   CERVICAL ROM:   Active ROM A/PROM (deg) eval  Flexion WFL with posterior neck pain  Extension Very limited with pain, movement preference around C5/C6 area  Right lateral flexion Very limited with pain  Left lateral flexion Very limited with pain  Right rotation 20 degrees with pain  Left rotation 22 degrees with pain   (Blank rows = not tested)  UPPER EXTREMITY MMT:  MMT Right eval Left eval  Shoulder flexion 3+ with neck and shoulder and arm pain 4- with pain at available range.   Shoulder extension    Shoulder abduction    Shoulder adduction    Shoulder extension    Shoulder internal rotation    Shoulder external rotation    Middle trapezius    Lower trapezius (seated manually resisted) 3+ with neck pain 3+ with neck pain  Elbow flexion    Elbow extension    Wrist flexion    Wrist extension    Wrist ulnar deviation    Wrist radial deviation    Wrist pronation    Wrist supination    Grip strength     (Blank rows = not tested)  UPPER EXTREMITY ROM:  Active ROM Right eval Left eval  Shoulder flexion 55 (scaption) 55 (scaption)  Shoulder extension    Shoulder abduction    Shoulder adduction  Shoulder extension    Shoulder internal rotation    Shoulder external rotation    Elbow flexion    Elbow extension    Wrist flexion    Wrist extension    Wrist ulnar deviation    Wrist radial deviation    Wrist pronation    Wrist supination     (Blank rows = not tested)    LUMBAR ROM:   AROM eval  Flexion   Extension   Right lateral flexion   Left lateral flexion   Right  rotation   Left rotation    (Blank rows = not tested)  LOWER EXTREMITY ROM:     Passive  Right eval Left eval  Hip flexion    Hip extension    Hip abduction    Hip adduction    Hip internal rotation    Hip external rotation    Knee flexion    Knee extension    Ankle dorsiflexion    Ankle plantarflexion    Ankle inversion    Ankle eversion     (Blank rows = not tested)  LOWER EXTREMITY MMT:    MMT Right eval Left eval  Hip flexion    Hip extension    Hip abduction    Hip adduction    Hip internal rotation    Hip external rotation    Knee flexion    Knee extension    Ankle dorsiflexion    Ankle plantarflexion    Ankle inversion    Ankle eversion     (Blank rows = not tested)  GAIT: Distance walked: 30 ft Assistive device utilized: None Level of assistance: SBA Comments: antalgic, decreased stance R LE, forward flexed, decreased gait speed  06/14/24 Orthostatic vital signs 06/14/24 (ABD binder on)  151/71 mmHg 69bpm seated  123/87 mmHg 78bpm standing (mild symptoms)  135/81 mmHg 80bpm standing (1 minutes)   TODAY'S SKILLED INTERVENTION  06/14/24  -review of LT goals, NDI completion -Seated chin tuck with gentle cervical flexion 15x5 seconds  -seated thoracic extension over chair back x15 (seated on airex to target mid thoracic)  -seated scapular retraction squeezes x15  -seated gentle L cervical side bend 1x8, rest, 1x8  -Blue physioball shoulder/lumbar rollouts 1x20   PATIENT EDUCATION:  Education details: POC Person educated: Patient Education method: Explanation Education comprehension: verbalized understanding  HOME EXERCISE PROGRAM: Pt was recommended to press his tongue at the roof of his mouth to activate his anterior cervical muscles throughout the day. Pt demonstrated and verbalized understanding.    Access Code: [email or web address] URL: [email or web address] Date: 05/10/2024 Prepared by: Emil Glassman  Exercises - Seated Cervical  Flexion AROM  - 2 x daily - 7 x weekly - 3 sets - 5 reps - Seated Transversus Abdominis Bracing  - 1 x daily - 7 x weekly - 2 -3 sets - 10 reps - 5 seconds hold - Seated Chin Tuck with Neck Elongation  - 2-3 x daily - 7 x weekly - 2-3 sets - 5 reps - Seated Cervical Sidebending AROM  - 3 x daily - 7 x weekly - 4 sets - 5 reps  ASSESSMENT:  CLINICAL IMPRESSION: Reassessment today revealing of mild but significant improvement overall and progress toward goals. NDI revealing of largest improvements, however degree of disability regarding IADL and household management remains very high and loathsome to patient. HEP adherence has been excellent despite fluctuating pain. Patient will benefit from skilled physical therapy intervention to reduce deficits and impairments identified in evaluation, in  order to reduce pain, improve quality of life, and maximize activity tolerance for ADL, IADL, and leisure/fitness. Physical therapy will help pt achieve long and short term goals of care.    OBJECTIVE IMPAIRMENTS: Abnormal gait, decreased balance, decreased endurance, difficulty walking, decreased ROM, decreased strength, dizziness, impaired UE functional use, improper body mechanics, postural dysfunction, and pain.   ACTIVITY LIMITATIONS: carrying, lifting, bending, sitting, standing, squatting, sleeping, stairs, transfers, bed mobility, bathing, toileting, dressing, reach over head, hygiene/grooming, locomotion level, and caring for others  PARTICIPATION LIMITATIONS:   PERSONAL FACTORS: Age, Fitness, Past/current experiences, Time since onset of injury/illness/exacerbation, and 3+ comorbidities: DVT, depression, dyspnea, heart attack, HTN, osteoporosis, are also affecting patient's functional outcome.   REHAB POTENTIAL: Fair    CLINICAL DECISION MAKING: Stable/uncomplicated  EVALUATION COMPLEXITY: Low   GOALS: Goals reviewed with patient? Yes  SHORT TERM GOALS: Target date: 05/13/2024  Pt will be  independent with his initial HEP to improve strength, decrease pain, improve ability to lift, look around as well as ambulate and perform standing tasks with less difficulty and pain. Baseline: Pt has not yet officially started his initial HEP (05/02/2024) Goal status: ACHIEVED   LONG TERM GOALS: Target date: 07/01/2024  Pt will have a decrease in neck pain to 5/10 or less at worst to promote ability to look around more comfortably.  Baseline: 9/10 neck pain at worst for the past 3 months (05/02/2024); 6/10 at most for 7 days (06/02/2024); 06/14/24: 7-8/10  Goal status: Progressing  2.  Pt will have a decrease on low back pain to 4/10 or less at worst to promote ability to tolerate sitting, standing, walking and performing standing tasks more comfortably for his back.  Baseline: 8/10 low back pain at worst for past 7 days; 6/10 worst pain in past week 06/14/24.  Goal status: INITIAL  3.  Pt will improve his Neck Disability Index (NDI) score by at least 20% as a demonstration of improved function.  Baseline: Neck Disability Index score: 41/50 (82%) (05/02/2024); 06/14/24: 58% NDI Goal status: ACHIEVED   4.  Pt will improve his cervical rotation AROM to 50 degrees or more to promote ability to look around more comfortably. Baseline:  Active ROM A/PROM (deg) eval AROM (06/02/2024)  Right rotation 20 degrees with pain 30 degrees with pain  Left rotation 22 degrees with pain 35 degrees with pain   (05/02/2024) Goal status: INITIAL  5.  Pt will improve B shoulder flexion AROM to 100 degrees or more to promote ability to reach as well as lift more comfortably.  Baseline:  Active ROM Right eval Left eval R (06/02/2024) L (06/02/2024)  Shoulder flexion 55 (scaption) 55 (scaption) 44  (Scaption) 65  (Scaption)   (05/02/2024)  Goal status: ONGOING  6.  Pt will improve his shoulder flexion and lower trap strength by at least 1/2 MMT grade to promote ability to raise his arms as well as lift with  less difficulty.  Baseline:  MMT Right eval Left eval  Shoulder flexion 3+ with neck and shoulder and arm pain 4- with pain at available range.   Lower trapezius (seated manually resisted) 3+ with neck pain 3+ with neck pain   (05/02/2024) Goal status: INITIAL   PLAN:  PT FREQUENCY: 1-2x/week  PT DURATION: 8 weeks  PLANNED INTERVENTIONS: 97110-Therapeutic exercises, 97530- Therapeutic activity, [id number]- Neuromuscular re-education, 97535- Self Care, [id number]- Manual therapy, [id number]- Gait training, [phone]- Electrical stimulation (unattended), [phone]- Ionotophoresis 4mg /ml Dexamethasone , Patient/Family education, and Balance training.  PLAN FOR NEXT SESSION:  posture, B scapular, anterior cervical, trunk and glute strengthening, mechanics, manual techniques, modalities PRN  2:36 PM, 06/14/24 Peggye JAYSON Linear, PT, DPT Physical Therapist - [place] [phone] (Office)   Linear Peggye JAYSON, PT, DPT 06/14/2024, 1:57 PM

## 2024-06-15 ENCOUNTER — Ambulatory Visit (HOSPITAL_BASED_OUTPATIENT_CLINIC_OR_DEPARTMENT_OTHER): Admitting: Student in an Organized Health Care Education/Training Program

## 2024-06-15 ENCOUNTER — Encounter: Payer: Self-pay | Admitting: Student in an Organized Health Care Education/Training Program

## 2024-06-15 ENCOUNTER — Ambulatory Visit
Admission: RE | Admit: 2024-06-15 | Discharge: 2024-06-15 | Disposition: A | Discharge status: Home / Self Care | Source: Ambulatory Visit | Attending: Student in an Organized Health Care Education/Training Program | Admitting: Student in an Organized Health Care Education/Training Program

## 2024-06-15 DIAGNOSIS — G894 Chronic pain syndrome: Secondary | ICD-10-CM | POA: Diagnosis not present

## 2024-06-15 DIAGNOSIS — M5412 Radiculopathy, cervical region: Secondary | ICD-10-CM | POA: Insufficient documentation

## 2024-06-15 MED ORDER — SODIUM CHLORIDE 0.9% FLUSH
1.0000 mL | Freq: Once | INTRAVENOUS | Status: AC
  Administered 2024-06-15: 1 mL

## 2024-06-15 MED ORDER — SODIUM CHLORIDE (PF) 0.9 % IJ SOLN
INTRAMUSCULAR | Status: AC
  Filled 2024-06-15: qty 10

## 2024-06-15 MED ORDER — ROPIVACAINE HCL 2 MG/ML IJ SOLN
1.0000 mL | Freq: Once | INTRAMUSCULAR | Status: AC
  Administered 2024-06-15: 1 mL via EPIDURAL
  Filled 2024-06-15: qty 20

## 2024-06-15 MED ORDER — DEXAMETHASONE SODIUM PHOSPHATE 10 MG/ML IJ SOLN
10.0000 mg | Freq: Once | INTRAMUSCULAR | Status: AC
  Administered 2024-06-15: 10 mg
  Filled 2024-06-15: qty 1

## 2024-06-15 MED ORDER — IOHEXOL 180 MG/ML  SOLN
10.0000 mL | Freq: Once | INTRAMUSCULAR | Status: AC
  Administered 2024-06-15: 10 mL via EPIDURAL
  Filled 2024-06-15: qty 20

## 2024-06-15 MED ORDER — LIDOCAINE HCL 2 % IJ SOLN
20.0000 mL | Freq: Once | INTRAMUSCULAR | Status: AC
  Administered 2024-06-15: 200 mg
  Filled 2024-06-15: qty 40

## 2024-06-15 NOTE — Progress Notes (Signed)
 PROVIDER NOTE: Interpretation of information contained herein should be left to medically-trained personnel. Specific patient instructions are provided elsewhere under Patient Instructions section of medical record. This document was created in part using STT-dictation technology, any transcriptional errors that may result from this process are unintentional.  Patient: Jonathon Snow Type: Established DOB: December 12, 1961 MRN: [id number] PCP: [name], PA-C  Service: Procedure DOS: 06/15/2024 Setting: Ambulatory Location: Ambulatory outpatient facility Delivery: Face-to-face Provider: Wallie Sherry, MD Specialty: Interventional Pain Management Specialty designation: 09 Location: Outpatient facility Ref. Prov.: Sherry Wallie, MD       Interventional Therapy   Type: Cervical Epidural Steroid injection (CESI) (Interlaminar) #1  Laterality: Right (-RT)  Level: C7-T1 DOS: 06/15/2024  Provider: Wallie Sherry, MD Imaging: Fluoroscopy-guided Spinal ([id number]) Anesthesia: Local anesthesia (1-2% Lidocaine )  Medical Necessity Purpose: Diagnostic/Therapeutic Rationale (medical necessity): procedure needed and proper for the diagnosis and/or treatment of Jonathon Snow medical symptoms and needs. Indications: Cervicalgia, cervical radicular pain, degenerative disc disease, severe enough to impact quality of life or function. 1. Cervical radicular pain   2. Chronic pain syndrome    NAS-11 Pain score:   Pre-procedure: 6 /10   Post-procedure: 3 /10     Position  Prep  Materials:  Location setting: Procedure suite Position: Prone, on modified reverse trendelenburg to facilitate breathing, with head in head-cradle. Pillows positioned under chest (below chin-level) with cervical spine flexed. Safety Precautions: Patient was assessed for positional comfort and pressure points before starting the procedure. Prepping solution: DuraPrep (Iodine Povacrylex [0.7% available iodine] and Isopropyl Alcohol,  74% w/w) Prep Area: Entire  cervicothoracic region Approach: percutaneous, paramedial Intended target: Posterior cervical epidural space Materials Procedure:  Tray: Epidural Needle(s): Epidural (Tuohy) Qty: 1 Length: (90mm) 3.5-inch Gauge: 22G  H&P (Pre-op Assessment):  Jonathon Snow is a 63 y.o. (year old), male patient, seen today for interventional treatment. He  has a past surgical history that includes Cyst excision (92 or 93 ); Ankle surgery (Right); I & D extremity (Right, 04/29/2017); Irrigation and debridement abscess (Left, 04/29/2017); Colonoscopy with propofol  (N/A, 05/28/2020); Flexible bronchoscopy (N/A, 11/01/2021); Bronchial washings (N/A, 11/01/2021); Back surgery; Hip pinning, cannulated (Left, 02/19/2023); Colonoscopy with propofol  (N/A, 07/01/2023); polypectomy (07/01/2023); Coronary/Graft Acute MI Revascularization (N/A, 11/05/2023); LEFT HEART CATH AND CORONARY ANGIOGRAPHY (N/A, 11/05/2023); and Coronary Ultrasound/IVUS (N/A, 11/05/2023). Jonathon Snow has a current medication list which includes the following prescription(s): amlodipine , apixaban , aripiprazole , azelastine, carbidopa-levodopa, cholecalciferol , clonazepam , clopidogrel , cyanocobalamin , fluticasone , gabapentin , hydralazine , hydroxychloroquine, hydroxyzine , levocetirizine, loratadine , losartan , meclizine , metoprolol  succinate, montelukast , biotene dry mouth gentle, multivitamin with minerals, mycophenolate , oxybutynin , pantoprazole , pregabalin, rosuvastatin , sertraline , silodosin , solifenacin , syringe 3cc/25gx1, tadalafil , tamsulosin , and tramadol . His primarily concern today is the Neck Pain  Initial Vital Signs:  Pulse/HCG Rate: 71ECG Heart Rate: 73 Temp: 98.2 F (36.8 C) Resp: 14 BP: (!) 143/97 SpO2: 100 %  BMI: Estimated body mass index is 28.37 kg/m as calculated from the following:   Height as of this encounter: 6' 2 (1.88 m).   Weight as of this encounter: 221 lb (100.2 kg).  Risk  Assessment: Allergies: Reviewed. He is allergic to enalapril and vicodin [hydrocodone -acetaminophen ].  Allergy Precautions: None required Coagulopathies: Reviewed. None identified.  Blood-thinner therapy: None at this time Active Infection(s): Reviewed. None identified. Jonathon Snow is afebrile  Site Confirmation: Jonathon Snow was asked to confirm the procedure and laterality before marking the site Procedure checklist: Completed Consent: Before the procedure and under the influence of no sedative(s), amnesic(s), or anxiolytics, the patient was informed of the treatment options, risks and possible complications.  To fulfill our ethical and legal obligations, as recommended by the American Medical Association's Code of Ethics, I have informed the patient of my clinical impression; the nature and purpose of the treatment or procedure; the risks, benefits, and possible complications of the intervention; the alternatives, including doing nothing; the risk(s) and benefit(s) of the alternative treatment(s) or procedure(s); and the risk(s) and benefit(s) of doing nothing. The patient was provided information about the general risks and possible complications associated with the procedure. These may include, but are not limited to: failure to achieve desired goals, infection, bleeding, organ or nerve damage, allergic reactions, paralysis, and death. In addition, the patient was informed of those risks and complications associated to Spine-related procedures, such as failure to decrease pain; infection (i.e.: Meningitis, epidural or intraspinal abscess); bleeding (i.e.: epidural hematoma, subarachnoid hemorrhage, or any other type of intraspinal or peri-dural bleeding); organ or nerve damage (i.e.: Any type of peripheral nerve, nerve root, or spinal cord injury) with subsequent damage to sensory, motor, and/or autonomic systems, resulting in permanent pain, numbness, and/or weakness of one or several areas of the body;  allergic reactions; (i.e.: anaphylactic reaction); and/or death. Furthermore, the patient was informed of those risks and complications associated with the medications. These include, but are not limited to: allergic reactions (i.e.: anaphylactic or anaphylactoid reaction(s)); adrenal axis suppression; blood sugar elevation that in diabetics may result in ketoacidosis or comma; water  retention that in patients with history of congestive heart failure may result in shortness of breath, pulmonary edema, and decompensation with resultant heart failure; weight gain; swelling or edema; medication-induced neural toxicity; particulate matter embolism and blood vessel occlusion with resultant organ, and/or nervous system infarction; and/or aseptic necrosis of one or more joints. Finally, the patient was informed that Medicine is not an exact science; therefore, there is also the possibility of unforeseen or unpredictable risks and/or possible complications that may result in a catastrophic outcome. The patient indicated having understood very clearly. We have given the patient no guarantees and we have made no promises. Enough time was given to the patient to ask questions, all of which were answered to the patient's satisfaction. Mr. Drapeau has indicated that he wanted to continue with the procedure. Attestation: I, the ordering provider, attest that I have discussed with the patient the benefits, risks, side-effects, alternatives, likelihood of achieving goals, and potential problems during recovery for the procedure that I have provided informed consent. Date  Time: 06/15/2024  8:32 AM  Pre-Procedure Preparation:  Monitoring: As per clinic protocol. Respiration, ETCO2, SpO2, BP, heart rate and rhythm monitor placed and checked for adequate function Safety Precautions: Patient was assessed for positional comfort and pressure points before starting the procedure. Time-out: I initiated and conducted the Time-out  before starting the procedure, as per protocol. The patient was asked to participate by confirming the accuracy of the Time Out information. Verification of the correct person, site, and procedure were performed and confirmed by me, the nursing staff, and the patient. Time-out conducted as per Joint Commission's Universal Protocol (UP.01.01.01). Time: 0855 Start Time: 0855 hrs.  Description  Narrative of Procedure:          Rationale (medical necessity): procedure needed and proper for the diagnosis and/or treatment of the patient's medical symptoms and needs. Start Time: 0855 hrs. Safety Precautions: Aspiration looking for blood return was conducted prior to all injections. At no point did we inject any substances, as a needle was being advanced. No attempts were made at seeking any paresthesias.  Safe injection practices and needle disposal techniques used. Medications properly checked for expiration dates. SDV (single dose vial) medications used. Description of procedure: Protocol guidelines were followed. The patient was assisted into a comfortable position. The target area was identified and the area prepped in the usual manner. Skin & deeper tissues infiltrated with local anesthetic. Appropriate amount of time allowed to pass for local anesthetics to take effect. Using fluoroscopic guidance, the epidural needle was introduced through the skin, ipsilateral to the reported pain, and advanced to the target area. Posterior laminar os was contacted and the needle walked caudad, until the lamina was cleared. The ligamentum flavum was engaged and the epidural space identified using "loss-of-resistance technique" with 2-3 ml of PF-NaCl (0.9% NSS), in a 5cc dedicated LOR syringe. (See Imaging guidance below for use of contrast details.) Once proper needle placement was secured, and negative aspiration confirmed, the solution was injected in intermittent fashion, asking for systemic symptoms every 0.5cc.  The needles were then removed and the area cleansed, making sure to leave some of the prepping solution back to take advantage of its long term bactericidal properties.  Vitals:   06/15/24 0836 06/15/24 0849 06/15/24 0855 06/15/24 0859  BP: (!) 143/97 (!) 147/87 (!) 133/96 (!) 142/99  Pulse: 71     Resp:  14 (!) 8 14  Temp: 98.2 F (36.8 C)     SpO2: 100% 100% 100% 100%  Weight: 221 lb (100.2 kg)     Height: 6' 2 (1.88 m)        End Time: 0859 hrs.  Imaging Guidance (Spinal):          Type of Imaging Technique: Fluoroscopy Guidance (Spinal) Indication(s): Fluoroscopy guidance for needle placement to enhance accuracy in procedures requiring precise needle localization for targeted delivery of medication in or near specific anatomical locations not easily accessible without such real-time imaging assistance. Exposure Time: Please see nurses notes. Contrast: Before injecting any contrast, we confirmed that the patient did not have an allergy to iodine, shellfish, or radiological contrast. Once satisfactory needle placement was completed at the desired level, radiological contrast was injected. Contrast injected under live fluoroscopy. No contrast complications. See chart for type and volume of contrast used. Fluoroscopic Guidance: I was personally present during the use of fluoroscopy. Tunnel Vision Technique used to obtain the best possible view of the target area. Parallax error corrected before commencing the procedure. Direction-depth-direction technique used to introduce the needle under continuous pulsed fluoroscopy. Once target was reached, antero-posterior, oblique, and lateral fluoroscopic projection used confirm needle placement in all planes. Images permanently stored in EMR. Interpretation: I personally interpreted the imaging intraoperatively. Adequate needle placement confirmed in multiple planes. Appropriate spread of contrast into desired area was observed. No evidence of  afferent or efferent intravascular uptake. No intrathecal or subarachnoid spread observed. Permanent images saved into the patient's record.  Post-operative Assessment:  Post-procedure Vital Signs:  Pulse/HCG Rate: 7171 Temp: 98.2 F (36.8 C) Resp: 14 BP: (!) 142/99 SpO2: 100 %  EBL: None  Complications: No immediate post-treatment complications observed by team, or reported by patient.  Note: The patient tolerated the entire procedure well. A repeat set of vitals were taken after the procedure and the patient was kept under observation following institutional policy, for this type of procedure. Post-procedural neurological assessment was performed, showing return to baseline, prior to discharge. The patient was provided with post-procedure discharge instructions, including a section on how to identify potential problems. Should any problems arise concerning this  procedure, the patient was given instructions to immediately contact us , at any time, without hesitation. In any case, we plan to contact the patient by telephone for a follow-up status report regarding this interventional procedure.  Comments:  No additional relevant information.  Plan of Care (POC)  Orders:  Orders Placed This Encounter  Procedures   DG PAIN CLINIC C-ARM 1-60 MIN NO REPORT    Intraoperative interpretation by procedural physician at [facility].    Standing Status:   Standing    Number of Occurrences:   1    Reason for exam::   Assistance in needle guidance and placement for procedures requiring needle placement in or near specific anatomical locations not easily accessible without such assistance.    Medications ordered for procedure: Meds ordered this encounter  Medications   iohexol  (OMNIPAQUE ) 180 MG/ML injection 10 mL    Must be Myelogram-compatible. If not available, you may substitute with a water -soluble, non-ionic, hypoallergenic, myelogram-compatible radiological contrast medium.    lidocaine  (XYLOCAINE ) 2 % (with pres) injection 400 mg   sodium chloride  flush (NS) 0.9 % injection 1 mL   ropivacaine  (PF) 2 mg/mL (0.2%) (NAROPIN ) injection 1 mL   dexamethasone  (DECADRON ) injection 10 mg   Medications administered: We administered iohexol , lidocaine , sodium chloride  flush, ropivacaine  (PF) 2 mg/mL (0.2%), and dexamethasone .  See the medical record for exact dosing, route, and time of administration.    Follow-up plan:   Return in about 3 weeks (around 07/06/2024) for PPE, F2F.     Recent Visits Date Type Provider Dept  05/26/24 Office Visit Marcelino Nurse, MD [facility]  Showing recent visits within past 90 days and meeting all other requirements Today's Visits Date Type Provider Dept  06/15/24 Procedure visit Marcelino Nurse, MD [facility]  Showing today's visits and meeting all other requirements Future Appointments Date Type Provider Dept  07/07/24 Appointment Marcelino Nurse, MD [facility]  Showing future appointments within next 90 days and meeting all other requirements   Disposition: Discharge home  Discharge (Date  Time): 06/15/2024; 0904 hrs.   Primary Care Physician: [name], PA-C Location: [facility] Note by: Nurse Marcelino, MD (TTS technology used. I apologize for any typographical errors that were not detected and corrected.) Date: 06/15/2024; Time: 9:09 AM  Disclaimer:  Medicine is not an Visual merchandiser. The only guarantee in medicine is that nothing is guaranteed. It is important to note that the decision to proceed with this intervention was based on the information collected from the patient. The Data and conclusions were drawn from the patient's questionnaire, the interview, and the physical examination. Because the information was provided in large part by the patient, it cannot be guaranteed that it has not been purposely or unconsciously manipulated. Every effort has been made to  obtain as much relevant data as possible for this evaluation. It is important to note that the conclusions that lead to this procedure are derived in large part from the available data. Always take into account that the treatment will also be dependent on availability of resources and existing treatment guidelines, considered by other Pain Management Practitioners as being common knowledge and practice, at the time of the intervention. For Medico-Legal purposes, it is also important to point out that variation in procedural techniques and pharmacological choices are the acceptable norm. The indications, contraindications, technique, and results of the above procedure should only be interpreted and judged by a Board-Certified Interventional Pain Specialist with extensive familiarity  and expertise in the same exact procedure and technique.

## 2024-06-15 NOTE — Progress Notes (Signed)
 Safety precautions to be maintained throughout the outpatient stay will include: orient to surroundings, keep bed in low position, maintain call bell within reach at all times, provide assistance with transfer out of bed and ambulation.

## 2024-06-15 NOTE — Patient Instructions (Signed)

## 2024-06-16 ENCOUNTER — Ambulatory Visit

## 2024-06-16 ENCOUNTER — Telehealth: Payer: Self-pay

## 2024-06-16 DIAGNOSIS — F332 Major depressive disorder, recurrent severe without psychotic features: Secondary | ICD-10-CM | POA: Diagnosis not present

## 2024-06-16 NOTE — Telephone Encounter (Signed)
 Post procedure follow up.  Patient states he feels flush in his face.  Informed patient that it could be the steroid.  Denies rash, fever or swelling.  Instructed patient that this should go away within 48 hours.  INformed him to call us  for any worsening of symptoms or any different symptoms.

## 2024-06-20 ENCOUNTER — Ambulatory Visit

## 2024-06-20 DIAGNOSIS — M5416 Radiculopathy, lumbar region: Secondary | ICD-10-CM

## 2024-06-20 DIAGNOSIS — M542 Cervicalgia: Secondary | ICD-10-CM

## 2024-06-20 DIAGNOSIS — M5459 Other low back pain: Secondary | ICD-10-CM

## 2024-06-20 DIAGNOSIS — R262 Difficulty in walking, not elsewhere classified: Secondary | ICD-10-CM

## 2024-06-20 DIAGNOSIS — M5412 Radiculopathy, cervical region: Secondary | ICD-10-CM

## 2024-06-20 NOTE — Therapy (Signed)
 OUTPATIENT PHYSICAL THERAPY TREATMENT   Patient Name: Jonathon Snow MRN: [id number] DOB:1961/07/19, 63 y.o., male Today's Date: 06/20/2024  END OF SESSION:  PT End of Session - 06/20/24 0955     Visit Number 11    Number of Visits 17    Date for PT Re-Evaluation 07/01/24    PT Start Time 0955    PT Stop Time 1033    PT Time Calculation (min) 38 min    Activity Tolerance Patient tolerated treatment well;No increased pain;Patient limited by fatigue    Behavior During Therapy [facility] for tasks assessed/performed                    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   Overview:  superior sagittal sinus, left transverse sinus and cortical veins    Closed left hip fracture, initial encounter (HCC) 02/18/2023   COVID-19 virus infection 12/2020   Depression    DVT (deep venous thrombosis) (HCC)    Dyspnea    GERD (gastroesophageal reflux disease)    Head injury 11/05/2023   Heart attack (HCC) 11/05/2023   Heel spur, left 02/16/2019   Heel spur, right 02/16/2019   Hematoma of groin 11/08/2023   Herpes zoster infection of lumbar region 02/20/2020   History of DVT (deep vein thrombosis) 03/22/2020   History of kidney stones    Hyperlipidemia    Hypertension    Long term current use of systemic steroids 11/08/2020   Lupus    Lymphedema 10/07/2018   Morbid obesity (HCC)    Opiate abuse, episodic (HCC) 02/26/2018   Osteoporosis    Pain and swelling of right lower extremity 02/03/2023   Pneumonia    PONV (postoperative nausea and vomiting)    Postphlebitic syndrome with ulcer, 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   Presence of IVC filter 03/22/2020   Removed   Pulmonary embolism (HCC)    Renal disorder    Stage III   Severe episode of recurrent  major depressive disorder, 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   Shock circulatory (HCC) 11/06/2023   STEMI (ST elevation myocardial infarction) (HCC) 11/05/2023   Past Surgical History:  Procedure Laterality Date   ANKLE SURGERY Right    BACK SURGERY     BRONCHIAL WASHINGS N/A 11/01/2021   Procedure: BRONCHIAL WASHINGS;  Surgeon: Parris Manna, MD;  Location: [facility] ORS;  Service: Thoracic;  Laterality: N/A;   COLONOSCOPY WITH PROPOFOL  N/A 05/28/2020   Procedure: COLONOSCOPY WITH PROPOFOL ;  Surgeon: Therisa Bi, MD;  Location: [facility] ENDOSCOPY;  Service: Endoscopy;  Laterality: N/A;   COLONOSCOPY WITH PROPOFOL  N/A 07/01/2023   Procedure: COLONOSCOPY WITH PROPOFOL ;  Surgeon: Therisa Bi, MD;  Location: [facility] ENDOSCOPY;  Service: Gastroenterology;  Laterality: N/A;   CORONARY ULTRASOUND/IVUS N/A 11/05/2023   Procedure: Coronary Ultrasound/IVUS;  Surgeon: Mady Bruckner, MD;  Location: [facility] INVASIVE CV LAB;  Service: Cardiovascular;  Laterality: N/A;   CORONARY/GRAFT ACUTE MI REVASCULARIZATION N/A 11/05/2023   Procedure: Coronary/Graft Acute MI Revascularization;  Surgeon: Mady Bruckner, MD;  Location: [facility] INVASIVE CV LAB;  Service: Cardiovascular;  Laterality: N/A;   CYST EXCISION  92 or 93    Liver cyst removal [facility]   FLEXIBLE BRONCHOSCOPY N/A 11/01/2021   Procedure: FLEXIBLE BRONCHOSCOPY;  Surgeon: Parris Manna, MD;  Location: [facility] ORS;  Service: Thoracic;  Laterality: N/A;   HIP PINNING,CANNULATED Left 02/19/2023   Procedure: PERCUTANEOUS FIXATION OF FEMORAL NECK;  Surgeon: Tobie Priest, MD;  Location: [facility] ORS;  Service: Orthopedics;  Laterality: Left;   I & D EXTREMITY Right 04/29/2017   Procedure: IRRIGATION AND DEBRIDEMENT EXTREMITY;  Surgeon: Shelva Dunnings, MD;  Location: [facility] ORS;  Service: General;  Laterality: Right;   IRRIGATION AND DEBRIDEMENT ABSCESS Left 04/29/2017   Procedure: IRRIGATION AND DEBRIDEMENT Scrotal ABSCESS;  Surgeon: Shelva Dunnings, MD;   Location: [facility] ORS;  Service: General;  Laterality: Left;   LEFT HEART CATH AND CORONARY ANGIOGRAPHY N/A 11/05/2023   Procedure: LEFT HEART CATH AND CORONARY ANGIOGRAPHY;  Surgeon: Mady Bruckner, MD;  Location: [facility] INVASIVE CV LAB;  Service: Cardiovascular;  Laterality: N/A;   POLYPECTOMY  07/01/2023   Procedure: POLYPECTOMY;  Surgeon: Therisa Bi, MD;  Location: [facility] ENDOSCOPY;  Service: Gastroenterology;;   Patient Active Problem List   Diagnosis Date Noted   Cervical radicular pain 05/26/2024   History of thoracic spinal fusion (T7-T12) 05/26/2024   History of lumbar spinal fusion (L5-S1) 05/26/2024   Chronic pain syndrome 05/26/2024   Anxiety and depression 02/26/2024   Dyslipidemia 02/26/2024   Peripheral neuropathy 02/26/2024   Essential hypertension 02/26/2024   Vertigo 02/26/2024   Syncope 02/25/2024   Orthostatic hypotension 02/11/2024   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) 01/20/2024   Anxiety 12/14/2023   Overweight (BMI 25.0-29.9) 11/23/2023   Chronic heart failure with preserved ejection fraction (HFpEF) (HCC) 11/23/2023   BPH (benign prostatic hyperplasia) 11/23/2023   Unstable angina (HCC) 11/10/2023   Closed T10 spinal fracture (HCC) 11/06/2023   Closed tricolumnar fracture of thoracic vertebra (HCC) 11/06/2023   Thoracic spine instability 11/06/2023   Chronic bilateral low back pain with left-sided sciatica 07/07/2023   Neuropathy 07/07/2023   Hx of colonic polyps 07/01/2023   Adenomatous polyp of colon 07/01/2023   Erectile disorder 01/22/2023   Coronary artery disease involving native coronary artery of native heart without angina pectoris 09/09/2022   Varicose 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 01/03/2022   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) 09/18/2021   Prediabetes 11/08/2020   Stage 3a chronic kidney disease (HCC) 11/08/2020   Seasonal allergies 11/08/2020   Rhinosinusitis 11/08/2020    Anticoagulant disorder (HCC) 11/07/2020   Senile purpura (HCC) 11/07/2020   MDD (major depressive disorder), recurrent episode, mild (HCC) 09/12/2019   Other spondylosis with radiculopathy, lumbar region 02/16/2019   Osteoporosis 10/12/2018   Chronic venous insufficiency 10/07/2018   Lymphedema 10/07/2018   SLE glomerulonephritis syndrome, 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HCC) 03/03/2018   Syncope and collapse 12/29/2017   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) 10/29/2016   Hyperlipidemia LDL goal <70 01/15/2016   Uncontrolled hypertension 01/15/2016   Obesity (BMI 30.0-34.9) 01/15/2016   Long term current use of anticoagulant 10/04/2015   Systemic lupus erythematosus (HCC) 05/30/2015   COPD, moderate (HCC) 06/27/2014   Shortness of breath 06/27/2014   History of pulmonary embolism 12/11/2013   Nodule of right lung 12/11/2013   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   Cystic disease of liver 08/21/2013    PCP: [name], PA-C REFERRING PROVIDER: Clois Fret, MD REFERRING DIAG: [phone] (ICD-10-CM) - Cervical radiculopathy M54.50,G89.29 (ICD-10-CM) - Chronic bilateral low back pain without sciatica  Rationale for Evaluation and Treatment: Rehabilitation  THERAPY DIAG:  Cervicalgia  Radiculopathy, cervical region  Other low back pain  Difficulty in walking, not elsewhere classified  Radiculopathy, lumbar region  ONSET DATE: November 2024  SUBJECTIVE:                                                                                                                                                                                           SUBJECTIVE STATEMENT:  5/10 neck pain currently and in R and L shoulders. Had some injections which provided temporary relief last Thursday (had a reaction to it too: flushed, headache, had face rash; MD aware; side effects went away after 48 hours)    PERTINENT HISTORY:  Cervicalgia, low  back pain. Pt also reports R > L C5/C6 dermatome to elbow bilaterally. Pt has a difficult time turning his head and looking up. No neck surgery. Had Thoracic and lumbar spine fusion surgery. Had mid back surgery (thoracic) November 2024 due to his fall. Neck pain is more recent. Neck and shoulder pain began November 2024 after his thoracic surgery. Usually sleeps on his L side but is currently difficulty to do secondary to B shoulder and neck pain, so pt is not sleeping well. Neck pain is the same since November 2024. Pt tends to have issues getting dizzy and passes out when he is standing and exerting himself  Vision gets blurry and splotchy. Room feels like it is waving around, pt is not walking on a stable surface, feels like he is walking on a boat and not having sea legs.   The IVC filter was removed.  Still has L LE DVT. Pt states taking Elequis. L LE DVT is still being treated.    PAIN:  Are you having pain? 5/10 pain in bilat UT area of neck and shoulders Rt> Lt.   PRECAUTIONS:  -dizzy and passes out when he is standing and exerting himself; chronic orthostatic hypotension; Lt LE DVT, fall risk  RED FLAGS: Bowel or bladder incontinence: No and Cauda equina syndrome: No   WEIGHT BEARING RESTRICTIONS: No  FALLS:  Has patient fallen in last 6 months? Yes. Number of falls 2 (original fall was when pt had a heart attack November 2024 and hurt his back. February 22, 2024, pt passed out and fell into the bath tub.   LIVING ENVIRONMENT: Lives with: lives alone Lives in: House/apartment Stairs: No Has following equipment at home: Single point cane and Environmental consultant - 2 wheeled  OCCUPATION: Not currently working  PLOF: Needs assistance with homemaking   PATIENT GOALS: be able to do everything better.   NEXT MD VISIT: Yes but does not know when.   OBJECTIVE:  Note: Objective measures were completed at Evaluation unless otherwise noted.  DIAGNOSTIC FINDINGS:  MR CERVICAL SPINE  WO CONTRAST  02/26/2024  Narrative & Impression  CLINICAL DATA:  Myelopathy, acute, cervical spine   EXAM: MRI CERVICAL SPINE WITHOUT CONTRAST   TECHNIQUE: Multiplanar, multisequence MR imaging of the cervical spine was performed. No intravenous contrast was administered.   COMPARISON:  CT of the cervical spine November 05, 2023.   FINDINGS: Alignment: No substantial sagittal subluxation.   Vertebrae: No evidence of acute fracture, evidence of discitis, or suspicious bone lesion. Benign vertebral venous malformation at T3. Mild T1-T2 height loss, unchanged.   Cord: Normal cord signal.  Neck   Posterior Fossa, vertebral arteries, paraspinal tissues: Visualized vertebral artery flow voids are maintained. No paraspinal edema.   Disc levels:   C2-C3: No significant disc protrusion, foraminal stenosis, or canal stenosis.   C3-C4: No significant disc protrusion, foraminal stenosis, or canal stenosis.   C4-C5: Right eccentric posterior disc osteophyte complex with right greater than left facet and uncovertebral hypertrophy. Resulting mild right greater than left foraminal stenosis. Mild canal stenosis.   C5-C6: Posterior disc osteophyte complex ligamentum flavum thickening and right greater than left facet and uncovertebral hypertrophy. Resulting mild-to-moderate bilateral foraminal stenosis. Mild to moderate canal stenosis.   C6-C7: Posterior disc osteophyte complex with bilateral facet and uncovertebral hypertrophy. Resulting moderate bilateral foraminal stenosis. Mild canal stenosis.   C7-T1: No significant disc protrusion, foraminal stenosis, or canal stenosis.   IMPRESSION: 1. 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 2. At C5-C6, mild to moderate canal and bilateral foraminal stenosis. 3. At C4-C5, mild canal and bilateral foraminal stenosis.     Electronically Signed   By: Gilmore GORMAN Molt M.D.   On: 02/27/2024 02:42         PATIENT  SURVEYS:  Neck Disability Index score: 41/50 (82%) (05/02/2024)  COGNITION: Overall cognitive status: Within functional limits for tasks assessed     SENSATION:   MUSCLE LENGTH:   POSTURE:   PALPATION: TTP to posterior neck   CERVICAL ROM:   Active ROM A/PROM (deg) eval  Flexion WFL with posterior neck pain  Extension Very limited with pain, movement preference around C5/C6 area  Right lateral flexion Very limited with pain  Left lateral flexion Very limited with pain  Right rotation 20 degrees with pain  Left rotation 22 degrees with pain   (Blank rows = not tested)  UPPER EXTREMITY MMT:  MMT Right eval Left eval  Shoulder flexion 3+ with neck and shoulder and arm pain 4- with pain at available range.   Shoulder extension    Shoulder abduction    Shoulder adduction    Shoulder extension    Shoulder internal rotation    Shoulder external rotation    Middle trapezius    Lower trapezius (seated manually resisted) 3+ with neck pain 3+ with neck pain  Elbow flexion    Elbow extension    Wrist flexion    Wrist extension    Wrist ulnar deviation    Wrist radial deviation    Wrist pronation    Wrist supination    Grip strength     (Blank rows = not tested)  UPPER EXTREMITY ROM:  Active ROM Right eval Left eval  Shoulder flexion 55 (scaption) 55 (scaption)  Shoulder extension    Shoulder abduction    Shoulder adduction    Shoulder extension    Shoulder internal rotation    Shoulder external rotation    Elbow flexion    Elbow extension    Wrist flexion    Wrist  extension    Wrist ulnar deviation    Wrist radial deviation    Wrist pronation    Wrist supination     (Blank rows = not tested)    LUMBAR ROM:   AROM eval  Flexion   Extension   Right lateral flexion   Left lateral flexion   Right rotation   Left rotation    (Blank rows = not tested)  LOWER EXTREMITY ROM:     Passive  Right eval Left eval  Hip flexion    Hip extension     Hip abduction    Hip adduction    Hip internal rotation    Hip external rotation    Knee flexion    Knee extension    Ankle dorsiflexion    Ankle plantarflexion    Ankle inversion    Ankle eversion     (Blank rows = not tested)  LOWER EXTREMITY MMT:    MMT Right eval Left eval  Hip flexion    Hip extension    Hip abduction    Hip adduction    Hip internal rotation    Hip external rotation    Knee flexion    Knee extension    Ankle dorsiflexion    Ankle plantarflexion    Ankle inversion    Ankle eversion     (Blank rows = not tested)  GAIT: Distance walked: 30 ft Assistive device utilized: None Level of assistance: SBA Comments: antalgic, decreased stance R LE, forward flexed, decreased gait speed  TREATMENT DATE: 06/20/2024   Neuromuscular re education   At start of session: Blood pressure L arm sitting, mechanically taken, normal cuff: 122/77, HR 72  Seated gentle cervical retraction in the flexed position, 10x5 seconds     Then with B scapular retraction 5x5 seconds  for 3 sets  Seated gentle L cervical side bend 5x5 seconds for 3 sets  Seated gentle manually resisted R scapular depression isometrics 5x5 seconds for 3 sets   Seated scapular retraction manually resisted isometrics targeting the lower trap  R 5x5 seconds for 3 sets   L 5x5 seconds for 3 sets   Seated B scapular retraction with cues for posterior tipping and depression 5x5 seconds fo 3 sets               Seated gentle manually resisted R scapular depression isometrics 5x5 seconds for 3 sets       Improved exercise technique, movement at target joints, use of target muscles after mod verbal, visual, tactile cues.             PATIENT EDUCATION:  Education details: POC Person educated: Patient Education method: Explanation Education comprehension: verbalized understanding  HOME EXERCISE PROGRAM: Pt was recommended to press his tongue at the roof of his mouth to activate his  anterior cervical muscles throughout the day. Pt demonstrated and verbalized understanding.    Access Code: [email or web address] URL: [email or web address] Date: 05/10/2024 Prepared by: Emil Glassman  Exercises - Seated Cervical Flexion AROM  - 2 x daily - 7 x weekly - 3 sets - 5 reps - Seated Transversus Abdominis Bracing  - 1 x daily - 7 x weekly - 2 -3 sets - 10 reps - 5 seconds hold - Seated Chin Tuck with Neck Elongation  - 2-3 x daily - 7 x weekly - 2-3 sets - 5 reps - Seated Cervical Sidebending AROM  - 3 x daily - 7 x weekly - 4 sets - 5 reps  ASSESSMENT:  CLINICAL IMPRESSION: Better able to tolerate holds during exercises today compared to previous sessions. Worked on scapular strengthening to help decrease upper trap and scalene muscle tension Continued working on improving thoracic extension to decrease cervical extension stress. Continued with gentle L cervical side bending to decrease pressure to R UE nerves.   Fair tolerance to today's session, pt limited by pain. Pt will benefit from continued skilled physical therapy services to decrease pain, improve strength and function.     OBJECTIVE IMPAIRMENTS: Abnormal gait, decreased balance, decreased endurance, difficulty walking, decreased ROM, decreased strength, dizziness, impaired UE functional use, improper body mechanics, postural dysfunction, and pain.   ACTIVITY LIMITATIONS: carrying, lifting, bending, sitting, standing, squatting, sleeping, stairs, transfers, bed mobility, bathing, toileting, dressing, reach over head, hygiene/grooming, locomotion level, and caring for others  PARTICIPATION LIMITATIONS:   PERSONAL FACTORS: Age, Fitness, Past/current experiences, Time since onset of injury/illness/exacerbation, and 3+ comorbidities: DVT, depression, dyspnea, heart attack, HTN, osteoporosis, are also affecting patient's functional outcome.   REHAB POTENTIAL: Fair    CLINICAL DECISION MAKING:  Stable/uncomplicated  EVALUATION COMPLEXITY: Low   GOALS: Goals reviewed with patient? Yes  SHORT TERM GOALS: Target date: 05/13/2024  Pt will be independent with his initial HEP to improve strength, decrease pain, improve ability to lift, look around as well as ambulate and perform standing tasks with less difficulty and pain. Baseline: Pt has not yet officially started his initial HEP (05/02/2024) Goal status: ACHIEVED   LONG TERM GOALS: Target date: 07/01/2024  Pt will have a decrease in neck pain to 5/10 or less at worst to promote ability to look around more comfortably.  Baseline: 9/10 neck pain at worst for the past 3 months (05/02/2024); 6/10 at most for 7 days (06/02/2024); 06/14/24: 7-8/10  Goal status: Progressing  2.  Pt will have a decrease on low back pain to 4/10 or less at worst to promote ability to tolerate sitting, standing, walking and performing standing tasks more comfortably for his back.  Baseline: 8/10 low back pain at worst for past 7 days; 6/10 worst pain in past week 06/14/24.  Goal status: INITIAL  3.  Pt will improve his Neck Disability Index (NDI) score by at least 20% as a demonstration of improved function.  Baseline: Neck Disability Index score: 41/50 (82%) (05/02/2024); 06/14/24: 58% NDI Goal status: ACHIEVED   4.  Pt will improve his cervical rotation AROM to 50 degrees or more to promote ability to look around more comfortably. Baseline:  Active ROM A/PROM (deg) eval AROM (06/02/2024)  Right rotation 20 degrees with pain 30 degrees with pain  Left rotation 22 degrees with pain 35 degrees with pain   (05/02/2024) Goal status: INITIAL  5.  Pt will improve B shoulder flexion AROM to 100 degrees or more to promote ability to reach as well as lift more comfortably.  Baseline:  Active ROM Right eval Left eval R (06/02/2024) L (06/02/2024)  Shoulder flexion 55 (scaption) 55 (scaption) 44  (Scaption) 65  (Scaption)   (05/02/2024)  Goal status:  ONGOING  6.  Pt will improve his shoulder flexion and lower trap strength by at least 1/2 MMT grade to promote ability to raise his arms as well as lift with less difficulty.  Baseline:  MMT Right eval Left eval  Shoulder flexion 3+ with neck and shoulder and arm pain 4- with pain at available range.   Lower trapezius (seated manually resisted) 3+ with neck pain 3+ with neck pain   (05/02/2024) Goal  status: INITIAL   PLAN:  PT FREQUENCY: 1-2x/week  PT DURATION: 8 weeks  PLANNED INTERVENTIONS: 97110-Therapeutic exercises, 97530- Therapeutic activity, [id number]- Neuromuscular re-education, 97535- Self Care, [id number]- Manual therapy, [phone]- Gait training, [phone]- Electrical stimulation (unattended), [phone]- Ionotophoresis 4mg /ml Dexamethasone , Patient/Family education, and Balance training.  PLAN FOR NEXT SESSION: posture, B scapular, anterior cervical, trunk and glute strengthening, mechanics, manual techniques, modalities PRN   Burnie Therien, PT, DPT 06/20/2024, 11:37 AM

## 2024-06-22 ENCOUNTER — Ambulatory Visit: Attending: Neurosurgery

## 2024-06-22 DIAGNOSIS — M5412 Radiculopathy, cervical region: Secondary | ICD-10-CM | POA: Insufficient documentation

## 2024-06-22 DIAGNOSIS — M542 Cervicalgia: Secondary | ICD-10-CM | POA: Insufficient documentation

## 2024-06-22 NOTE — Therapy (Signed)
 OUTPATIENT PHYSICAL THERAPY TREATMENT   Patient Name: Jonathon Snow MRN: [id number] DOB:10/04/1961, 63 y.o., male Today's Date: 06/22/2024  END OF SESSION:  PT End of Session - 06/22/24 1032     Visit Number 12    Number of Visits 17    Date for PT Re-Evaluation 07/01/24    PT Start Time 1032    PT Stop Time 1115    PT Time Calculation (min) 43 min    Activity Tolerance Patient tolerated treatment well;No increased pain;Patient limited by fatigue    Behavior During Therapy [facility] for tasks assessed/performed                     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   Overview:  superior sagittal sinus, left transverse sinus and cortical veins    Closed left hip fracture, initial encounter (HCC) 02/18/2023   COVID-19 virus infection 12/2020   Depression    DVT (deep venous thrombosis) (HCC)    Dyspnea    GERD (gastroesophageal reflux disease)    Head injury 11/05/2023   Heart attack (HCC) 11/05/2023   Heel spur, left 02/16/2019   Heel spur, right 02/16/2019   Hematoma of groin 11/08/2023   Herpes zoster infection of lumbar region 02/20/2020   History of DVT (deep vein thrombosis) 03/22/2020   History of kidney stones    Hyperlipidemia    Hypertension    Long term current use of systemic steroids 11/08/2020   Lupus    Lymphedema 10/07/2018   Morbid obesity (HCC)    Opiate abuse, episodic (HCC) 02/26/2018   Osteoporosis    Pain and swelling of right lower extremity 02/03/2023   Pneumonia    PONV (postoperative nausea and vomiting)    Postphlebitic syndrome with ulcer, 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   Presence of IVC filter 03/22/2020   Removed   Pulmonary embolism (HCC)    Renal disorder    Stage III   Severe episode of recurrent  major depressive disorder, 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   Shock circulatory (HCC) 11/06/2023   STEMI (ST elevation myocardial infarction) (HCC) 11/05/2023   Past Surgical History:  Procedure Laterality Date   ANKLE SURGERY Right    BACK SURGERY     BRONCHIAL WASHINGS N/A 11/01/2021   Procedure: BRONCHIAL WASHINGS;  Surgeon: Parris Manna, MD;  Location: [facility] ORS;  Service: Thoracic;  Laterality: N/A;   COLONOSCOPY WITH PROPOFOL  N/A 05/28/2020   Procedure: COLONOSCOPY WITH PROPOFOL ;  Surgeon: Therisa Bi, MD;  Location: [facility] ENDOSCOPY;  Service: Endoscopy;  Laterality: N/A;   COLONOSCOPY WITH PROPOFOL  N/A 07/01/2023   Procedure: COLONOSCOPY WITH PROPOFOL ;  Surgeon: Therisa Bi, MD;  Location: [facility] ENDOSCOPY;  Service: Gastroenterology;  Laterality: N/A;   CORONARY ULTRASOUND/IVUS N/A 11/05/2023   Procedure: Coronary Ultrasound/IVUS;  Surgeon: Mady Bruckner, MD;  Location: [facility] INVASIVE CV LAB;  Service: Cardiovascular;  Laterality: N/A;   CORONARY/GRAFT ACUTE MI REVASCULARIZATION N/A 11/05/2023   Procedure: Coronary/Graft Acute MI Revascularization;  Surgeon: Mady Bruckner, MD;  Location: [facility] INVASIVE CV LAB;  Service: Cardiovascular;  Laterality: N/A;   CYST EXCISION  92 or 93    Liver cyst removal [facility]   FLEXIBLE BRONCHOSCOPY N/A 11/01/2021   Procedure: FLEXIBLE BRONCHOSCOPY;  Surgeon: Parris Manna, MD;  Location: [facility] ORS;  Service:  Thoracic;  Laterality: N/A;   HIP PINNING,CANNULATED Left 02/19/2023   Procedure: PERCUTANEOUS FIXATION OF FEMORAL NECK;  Surgeon: Tobie Priest, MD;  Location: [facility] ORS;  Service: Orthopedics;  Laterality: Left;   I & D EXTREMITY Right 04/29/2017   Procedure: IRRIGATION AND DEBRIDEMENT EXTREMITY;  Surgeon: Shelva Dunnings, MD;  Location: [facility] ORS;  Service: General;  Laterality: Right;   IRRIGATION AND DEBRIDEMENT ABSCESS Left 04/29/2017   Procedure: IRRIGATION AND DEBRIDEMENT Scrotal ABSCESS;  Surgeon: Shelva Dunnings, MD;   Location: [facility] ORS;  Service: General;  Laterality: Left;   LEFT HEART CATH AND CORONARY ANGIOGRAPHY N/A 11/05/2023   Procedure: LEFT HEART CATH AND CORONARY ANGIOGRAPHY;  Surgeon: Mady Bruckner, MD;  Location: [facility] INVASIVE CV LAB;  Service: Cardiovascular;  Laterality: N/A;   POLYPECTOMY  07/01/2023   Procedure: POLYPECTOMY;  Surgeon: Therisa Bi, MD;  [facility] END[facility];  Service: Gastroenterology;;   Patient Active Problem List   Diagnosis Date Noted   Cervical radicular pain 05/26/2024   History of thoracic spinal fusion (T7-T12) 05/26/2024   History of lumbar spinal fusion (L5-S1) 05/26/2024   Chronic pain syndrome 05/26/2024   Anxiety and depression 02/26/2024   Dyslipidemia 02/26/2024   Peripheral neuropathy 02/26/2024   Essential hypertension 02/26/2024   Vertigo 02/26/2024   Syncope 02/25/2024   Orthostatic hypotension 02/11/2024   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) 01/20/2024   Anxiety 12/14/2023   Overweight (BMI 25.0-29.9) 11/23/2023   Chronic heart failure with preserved ejection fraction (HFpEF) (HCC) 11/23/2023   BPH (benign prostatic hyperplasia) 11/23/2023   Unstable angina (HCC) 11/10/2023   Closed T10 spinal fracture (HCC) 11/06/2023   Closed tricolumnar fracture of thoracic vertebra (HCC) 11/06/2023   Thoracic spine instability 11/06/2023   Chronic bilateral low back pain with left-sided sciatica 07/07/2023   Neuropathy 07/07/2023   Hx of colonic polyps 07/01/2023   Adenomatous polyp of colon 07/01/2023   Erectile disorder 01/22/2023   Coronary artery disease involving native coronary artery of native heart without angina pectoris 09/09/2022   Varicose 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 01/03/2022   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) 09/18/2021   Prediabetes 11/08/2020   Stage 3a chronic kidney disease (HCC) 11/08/2020   Seasonal allergies 11/08/2020   Rhinosinusitis 11/08/2020    Anticoagulant disorder (HCC) 11/07/2020   Senile purpura (HCC) 11/07/2020   MDD (major depressive disorder), recurrent episode, mild (HCC) 09/12/2019   Other spondylosis with radiculopathy, lumbar region 02/16/2019   Osteoporosis 10/12/2018   Chronic venous insufficiency 10/07/2018   Lymphedema 10/07/2018   SLE glomerulonephritis syndrome, 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HCC) 03/03/2018   Syncope and collapse 12/29/2017   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) 10/29/2016   Hyperlipidemia LDL goal <70 01/15/2016   Uncontrolled hypertension 01/15/2016   Obesity (BMI 30.0-34.9) 01/15/2016   Long term current use of anticoagulant 10/04/2015   Systemic lupus erythematosus (HCC) 05/30/2015   COPD, moderate (HCC) 06/27/2014   Shortness of breath 06/27/2014   History of pulmonary embolism 12/11/2013   Nodule of right lung 12/11/2013   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   Cystic disease of liver 08/21/2013    PCP: [name], PA-C REFERRING PROVIDER: Clois Fret, MD REFERRING DIAG: [phone] (ICD-10-CM) - Cervical radiculopathy M54.50,G89.29 (ICD-10-CM) - Chronic bilateral low back pain without sciatica  Rationale for Evaluation and Treatment: Rehabilitation  THERAPY DIAG:  Cervicalgia  Radiculopathy, cervical region  ONSET DATE: November 2024  SUBJECTIVE:  SUBJECTIVE STATEMENT:  Neck is about a 6/10 currently, the shoulders are a 7/10 currently. Does not know if the rain has anything to do with it or not. Was a little sore after last session, woke up the next day and it was a little better.      PERTINENT HISTORY:  Cervicalgia, low back pain. Pt also reports R > L C5/C6 dermatome to elbow bilaterally. Pt has a difficult time turning his head  and looking up. No neck surgery. Had Thoracic and lumbar spine fusion surgery. Had mid back surgery (thoracic) November 2024 due to his fall. Neck pain is more recent. Neck and shoulder pain began November 2024 after his thoracic surgery. Usually sleeps on his L side but is currently difficulty to do secondary to B shoulder and neck pain, so pt is not sleeping well. Neck pain is the same since November 2024. Pt tends to have issues getting dizzy and passes out when he is standing and exerting himself  Vision gets blurry and splotchy. Room feels like it is waving around, pt is not walking on a stable surface, feels like he is walking on a boat and not having sea legs.   The IVC filter was removed.  Still has L LE DVT. Pt states taking Elequis. L LE DVT is still being treated.    PAIN:  Are you having pain? 5/10 pain in bilat UT area of neck and shoulders Rt> Lt.   PRECAUTIONS:  -dizzy and passes out when he is standing and exerting himself; chronic orthostatic hypotension; Lt LE DVT, fall risk  RED FLAGS: Bowel or bladder incontinence: No and Cauda equina syndrome: No   WEIGHT BEARING RESTRICTIONS: No  FALLS:  Has patient fallen in last 6 months? Yes. Number of falls 2 (original fall was when pt had a heart attack November 2024 and hurt his back. February 22, 2024, pt passed out and fell into the bath tub.   LIVING ENVIRONMENT: Lives with: lives alone Lives in: House/apartment Stairs: No Has following equipment at home: Single point cane and Environmental consultant - 2 wheeled  OCCUPATION: Not currently working  PLOF: Needs assistance with homemaking   PATIENT GOALS: be able to do everything better.   NEXT MD VISIT: Yes but does not know when.   OBJECTIVE:  Note: Objective measures were completed at Evaluation unless otherwise noted.  DIAGNOSTIC FINDINGS:  MR CERVICAL SPINE WO CONTRAST 02/26/2024  Narrative & Impression  CLINICAL DATA:  Myelopathy, acute, cervical spine   EXAM: MRI CERVICAL  SPINE WITHOUT CONTRAST   TECHNIQUE: Multiplanar, multisequence MR imaging of the cervical spine was performed. No intravenous contrast was administered.   COMPARISON:  CT of the cervical spine November 05, 2023.   FINDINGS: Alignment: No substantial sagittal subluxation.   Vertebrae: No evidence of acute fracture, evidence of discitis, or suspicious bone lesion. Benign vertebral venous malformation at T3. Mild T1-T2 height loss, unchanged.   Cord: Normal cord signal.  Neck   Posterior Fossa, vertebral arteries, paraspinal tissues: Visualized vertebral artery flow voids are maintained. No paraspinal edema.   Disc levels:   C2-C3: No significant disc protrusion, foraminal stenosis, or canal stenosis.   C3-C4: No significant disc protrusion, foraminal stenosis, or canal stenosis.   C4-C5: Right eccentric posterior disc osteophyte complex with right greater than left facet and uncovertebral hypertrophy. Resulting mild right greater than left foraminal stenosis. Mild canal stenosis.   C5-C6: Posterior disc osteophyte complex ligamentum flavum thickening and right greater than left facet and uncovertebral  hypertrophy. Resulting mild-to-moderate bilateral foraminal stenosis. Mild to moderate canal stenosis.   C6-C7: Posterior disc osteophyte complex with bilateral facet and uncovertebral hypertrophy. Resulting moderate bilateral foraminal stenosis. Mild canal stenosis.   C7-T1: No significant disc protrusion, foraminal stenosis, or canal stenosis.   IMPRESSION: 1. 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 2. At C5-C6, mild to moderate canal and bilateral foraminal stenosis. 3. At C4-C5, mild canal and bilateral foraminal stenosis.     Electronically Signed   By: Gilmore GORMAN Molt M.D.   On: 02/27/2024 02:42         PATIENT SURVEYS:  Neck Disability Index score: 41/50 (82%) (05/02/2024)  COGNITION: Overall cognitive status:  Within functional limits for tasks assessed     SENSATION:   MUSCLE LENGTH:   POSTURE:   PALPATION: TTP to posterior neck   CERVICAL ROM:   Active ROM A/PROM (deg) eval  Flexion WFL with posterior neck pain  Extension Very limited with pain, movement preference around C5/C6 area  Right lateral flexion Very limited with pain  Left lateral flexion Very limited with pain  Right rotation 20 degrees with pain  Left rotation 22 degrees with pain   (Blank rows = not tested)  UPPER EXTREMITY MMT:  MMT Right eval Left eval  Shoulder flexion 3+ with neck and shoulder and arm pain 4- with pain at available range.   Shoulder extension    Shoulder abduction    Shoulder adduction    Shoulder extension    Shoulder internal rotation    Shoulder external rotation    Middle trapezius    Lower trapezius (seated manually resisted) 3+ with neck pain 3+ with neck pain  Elbow flexion    Elbow extension    Wrist flexion    Wrist extension    Wrist ulnar deviation    Wrist radial deviation    Wrist pronation    Wrist supination    Grip strength     (Blank rows = not tested)  UPPER EXTREMITY ROM:  Active ROM Right eval Left eval  Shoulder flexion 55 (scaption) 55 (scaption)  Shoulder extension    Shoulder abduction    Shoulder adduction    Shoulder extension    Shoulder internal rotation    Shoulder external rotation    Elbow flexion    Elbow extension    Wrist flexion    Wrist extension    Wrist ulnar deviation    Wrist radial deviation    Wrist pronation    Wrist supination     (Blank rows = not tested)    LUMBAR ROM:   AROM eval  Flexion   Extension   Right lateral flexion   Left lateral flexion   Right rotation   Left rotation    (Blank rows = not tested)  LOWER EXTREMITY ROM:     Passive  Right eval Left eval  Hip flexion    Hip extension    Hip abduction    Hip adduction    Hip internal rotation    Hip external rotation    Knee flexion     Knee extension    Ankle dorsiflexion    Ankle plantarflexion    Ankle inversion    Ankle eversion     (Blank rows = not tested)  LOWER EXTREMITY MMT:    MMT Right eval Left eval  Hip flexion    Hip extension    Hip abduction    Hip adduction  Hip internal rotation    Hip external rotation    Knee flexion    Knee extension    Ankle dorsiflexion    Ankle plantarflexion    Ankle inversion    Ankle eversion     (Blank rows = not tested)  GAIT: Distance walked: 30 ft Assistive device utilized: None Level of assistance: SBA Comments: antalgic, decreased stance R LE, forward flexed, decreased gait speed  TREATMENT DATE: 06/22/2024    Manual therapy   Seated STM to B cervical paraspinal, upper trap, distal scalene and R pectoralis muscles to decrease tension   No change in neck pain    Neuromuscular re education   At start of session: Blood pressure L arm sitting, mechanically taken, normal cuff: 137/79, HR 65 SpO2 100 % room air  Sitting with gentle caudal pressure to R first rib area from PT  L cervical side bend 5x6  Seated gentle manually resisted R scapular depression isometrics 5x3   Seated B scapular retraction to promote thoracic extension 5x3.  Seated chin tuck 10x3  Seated gentle manually resisted R scapular depression isometrics 5x3    Seated B scapular retraction to promote thoracic extension 5x3.  PT manual assist to get more scapular retraction   Slight decreased in neck pain reported afterwards.     Improved exercise technique, movement at target joints, use of target muscles after mod verbal, visual, tactile cues.             PATIENT EDUCATION:  Education details: POC Person educated: Patient Education method: Explanation Education comprehension: verbalized understanding  HOME EXERCISE PROGRAM: Pt was recommended to press his tongue at the roof of his mouth to activate his anterior cervical muscles throughout the day. Pt  demonstrated and verbalized understanding.    Access Code: [email or web address] URL: [email or web address] Date: 05/10/2024 Prepared by: Emil Glassman  Exercises - Seated Cervical Flexion AROM  - 2 x daily - 7 x weekly - 3 sets - 5 reps - Seated Transversus Abdominis Bracing  - 1 x daily - 7 x weekly - 2 -3 sets - 10 reps - 5 seconds hold - Seated Chin Tuck with Neck Elongation  - 2-3 x daily - 7 x weekly - 2-3 sets - 5 reps - Seated Cervical Sidebending AROM  - 3 x daily - 7 x weekly - 4 sets - 5 reps  ASSESSMENT:  CLINICAL IMPRESSION: Removed hold times during exercises today so as to not irritate his symptoms. Worked on thoracic extension, as well as scapular strengthening as well as manual therapy to help decrease muscle tension to his neck. Decreased neck pain reported after session following thoracic extension exercises to decrease stress to lower cervical spine and nerves. Pt will benefit from continued skilled physical therapy services to decrease pain, improve strength and function.     OBJECTIVE IMPAIRMENTS: Abnormal gait, decreased balance, decreased endurance, difficulty walking, decreased ROM, decreased strength, dizziness, impaired UE functional use, improper body mechanics, postural dysfunction, and pain.   ACTIVITY LIMITATIONS: carrying, lifting, bending, sitting, standing, squatting, sleeping, stairs, transfers, bed mobility, bathing, toileting, dressing, reach over head, hygiene/grooming, locomotion level, and caring for others  PARTICIPATION LIMITATIONS:   PERSONAL FACTORS: Age, Fitness, Past/current experiences, Time since onset of injury/illness/exacerbation, and 3+ comorbidities: DVT, depression, dyspnea, heart attack, HTN, osteoporosis, are also affecting patient's functional outcome.   REHAB POTENTIAL: Fair    CLINICAL DECISION MAKING: Stable/uncomplicated  EVALUATION COMPLEXITY: Low   GOALS: Goals reviewed with patient? Yes  SHORT TERM GOALS: Target  date: 05/13/2024  Pt will be independent with his initial HEP to improve strength, decrease pain, improve ability to lift, look around as well as ambulate and perform standing tasks with less difficulty and pain. Baseline: Pt has not yet officially started his initial HEP (05/02/2024) Goal status: ACHIEVED   LONG TERM GOALS: Target date: 07/01/2024  Pt will have a decrease in neck pain to 5/10 or less at worst to promote ability to look around more comfortably.  Baseline: 9/10 neck pain at worst for the past 3 months (05/02/2024); 6/10 at most for 7 days (06/02/2024); 06/14/24: 7-8/10  Goal status: Progressing  2.  Pt will have a decrease on low back pain to 4/10 or less at worst to promote ability to tolerate sitting, standing, walking and performing standing tasks more comfortably for his back.  Baseline: 8/10 low back pain at worst for past 7 days; 6/10 worst pain in past week 06/14/24.  Goal status: INITIAL  3.  Pt will improve his Neck Disability Index (NDI) score by at least 20% as a demonstration of improved function.  Baseline: Neck Disability Index score: 41/50 (82%) (05/02/2024); 06/14/24: 58% NDI Goal status: ACHIEVED   4.  Pt will improve his cervical rotation AROM to 50 degrees or more to promote ability to look around more comfortably. Baseline:  Active ROM A/PROM (deg) eval AROM (06/02/2024)  Right rotation 20 degrees with pain 30 degrees with pain  Left rotation 22 degrees with pain 35 degrees with pain   (05/02/2024) Goal status: INITIAL  5.  Pt will improve B shoulder flexion AROM to 100 degrees or more to promote ability to reach as well as lift more comfortably.  Baseline:  Active ROM Right eval Left eval R (06/02/2024) L (06/02/2024)  Shoulder flexion 55 (scaption) 55 (scaption) 44  (Scaption) 65  (Scaption)   (05/02/2024)  Goal status: ONGOING  6.  Pt will improve his shoulder flexion and lower trap strength by at least 1/2 MMT grade to promote ability to raise  his arms as well as lift with less difficulty.  Baseline:  MMT Right eval Left eval  Shoulder flexion 3+ with neck and shoulder and arm pain 4- with pain at available range.   Lower trapezius (seated manually resisted) 3+ with neck pain 3+ with neck pain   (05/02/2024) Goal status: INITIAL   PLAN:  PT FREQUENCY: 1-2x/week  PT DURATION: 8 weeks  PLANNED INTERVENTIONS: 97110-Therapeutic exercises, 97530- Therapeutic activity, [id number]- Neuromuscular re-education, 97535- Self Care, [id number]- Manual therapy, [id number]- Gait training, [phone]- Electrical stimulation (unattended), [phone]- Ionotophoresis 4mg /ml Dexamethasone , Patient/Family education, and Balance training.  PLAN FOR NEXT SESSION: posture, B scapular, anterior cervical, trunk and glute strengthening, mechanics, manual techniques, modalities PRN   Nalu Troublefield, PT, DPT 06/22/2024, 11:19 AM

## 2024-06-27 ENCOUNTER — Ambulatory Visit

## 2024-06-27 DIAGNOSIS — M5412 Radiculopathy, cervical region: Secondary | ICD-10-CM

## 2024-06-27 DIAGNOSIS — M542 Cervicalgia: Secondary | ICD-10-CM

## 2024-06-27 NOTE — Therapy (Signed)
 OUTPATIENT PHYSICAL THERAPY TREATMENT   Patient Name: Jonathon Snow MRN: [id number] DOB:April 16, 1961, 63 y.o., male Today's Date: 06/27/2024  END OF SESSION:  PT End of Session - 06/27/24 1030     Visit Number 13    Number of Visits 33    Date for PT Re-Evaluation 08/26/24    PT Start Time 1031    PT Stop Time 1109    PT Time Calculation (min) 38 min    Activity Tolerance Patient tolerated treatment well;Patient limited by pain    Behavior During Therapy [facility] for tasks assessed/performed                      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   Overview:  superior sagittal sinus, left transverse sinus and cortical veins    Closed left hip fracture, initial encounter (HCC) 02/18/2023   COVID-19 virus infection 12/2020   Depression    DVT (deep venous thrombosis) (HCC)    Dyspnea    GERD (gastroesophageal reflux disease)    Head injury 11/05/2023   Heart attack (HCC) 11/05/2023   Heel spur, left 02/16/2019   Heel spur, right 02/16/2019   Hematoma of groin 11/08/2023   Herpes zoster infection of lumbar region 02/20/2020   History of DVT (deep vein thrombosis) 03/22/2020   History of kidney stones    Hyperlipidemia    Hypertension    Long term current use of systemic steroids 11/08/2020   Lupus    Lymphedema 10/07/2018   Morbid obesity (HCC)    Opiate abuse, episodic (HCC) 02/26/2018   Osteoporosis    Pain and swelling of right lower extremity 02/03/2023   Pneumonia    PONV (postoperative nausea and vomiting)    Postphlebitic syndrome with ulcer, 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   Presence of IVC filter 03/22/2020   Removed   Pulmonary embolism (HCC)    Renal disorder    Stage III   Severe episode of recurrent major depressive  disorder, 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   Shock circulatory (HCC) 11/06/2023   STEMI (ST elevation myocardial infarction) (HCC) 11/05/2023   Past Surgical History:  Procedure Laterality Date   ANKLE SURGERY Right    BACK SURGERY     BRONCHIAL WASHINGS N/A 11/01/2021   Procedure: BRONCHIAL WASHINGS;  Surgeon: Parris Manna, MD;  Location: [facility] ORS;  Service: Thoracic;  Laterality: N/A;   COLONOSCOPY WITH PROPOFOL  N/A 05/28/2020   Procedure: COLONOSCOPY WITH PROPOFOL ;  Surgeon: Therisa Bi, MD;  Location: [facility] ENDOSCOPY;  Service: Endoscopy;  Laterality: N/A;   COLONOSCOPY WITH PROPOFOL  N/A 07/01/2023   Procedure: COLONOSCOPY WITH PROPOFOL ;  Surgeon: Therisa Bi, MD;  Location: [facility] ENDOSCOPY;  Service: Gastroenterology;  Laterality: N/A;   CORONARY ULTRASOUND/IVUS N/A 11/05/2023   Procedure: Coronary Ultrasound/IVUS;  Surgeon: Mady Bruckner, MD;  Location: [facility] INVASIVE CV LAB;  Service: Cardiovascular;  Laterality: N/A;   CORONARY/GRAFT ACUTE MI REVASCULARIZATION N/A 11/05/2023   Procedure: Coronary/Graft Acute MI Revascularization;  Surgeon: Mady Bruckner, MD;  Location: [facility] INVASIVE CV LAB;  Service: Cardiovascular;  Laterality: N/A;   CYST EXCISION  92 or 93    Liver cyst removal [facility]   FLEXIBLE BRONCHOSCOPY N/A 11/01/2021   Procedure: FLEXIBLE BRONCHOSCOPY;  Surgeon: Parris Manna, MD;  Location: [facility] ORS;  Service: Thoracic;  Laterality: N/A;   HIP PINNING,CANNULATED Left 02/19/2023   Procedure: PERCUTANEOUS FIXATION OF FEMORAL NECK;  Surgeon: Tobie Priest, MD;  Location: [facility] ORS;  Service: Orthopedics;  Laterality: Left;   I & D EXTREMITY Right 04/29/2017   Procedure: IRRIGATION AND DEBRIDEMENT EXTREMITY;  Surgeon: Shelva Dunnings, MD;  Location: [facility] ORS;  Service: General;  Laterality: Right;   IRRIGATION AND DEBRIDEMENT ABSCESS Left 04/29/2017   Procedure: IRRIGATION AND DEBRIDEMENT Scrotal ABSCESS;  Surgeon: Shelva Dunnings, MD;  Location: [facility] ORS;   Service: General;  Laterality: Left;   LEFT HEART CATH AND CORONARY ANGIOGRAPHY N/A 11/05/2023   Procedure: LEFT HEART CATH AND CORONARY ANGIOGRAPHY;  Surgeon: Mady Bruckner, MD;  Location: [facility] INVASIVE CV LAB;  Service: Cardiovascular;  Laterality: N/A;   POLYPECTOMY  07/01/2023   Procedure: POLYPECTOMY;  Surgeon: Therisa Bi, MD;  Location: [facility] ENDOSCOPY;  Service: Gastroenterology;;   Patient Active Problem List   Diagnosis Date Noted   Cervical radicular pain 05/26/2024   History of thoracic spinal fusion (T7-T12) 05/26/2024   History of lumbar spinal fusion (L5-S1) 05/26/2024   Chronic pain syndrome 05/26/2024   Anxiety and depression 02/26/2024   Dyslipidemia 02/26/2024   Peripheral neuropathy 02/26/2024   Essential hypertension 02/26/2024   Vertigo 02/26/2024   Syncope 02/25/2024   Orthostatic hypotension 02/11/2024   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) 01/20/2024   Anxiety 12/14/2023   Overweight (BMI 25.0-29.9) 11/23/2023   Chronic heart failure with preserved ejection fraction (HFpEF) (HCC) 11/23/2023   BPH (benign prostatic hyperplasia) 11/23/2023   Unstable angina (HCC) 11/10/2023   Closed T10 spinal fracture (HCC) 11/06/2023   Closed tricolumnar fracture of thoracic vertebra (HCC) 11/06/2023   Thoracic spine instability 11/06/2023   Chronic bilateral low back pain with left-sided sciatica 07/07/2023   Neuropathy 07/07/2023   Hx of colonic polyps 07/01/2023   Adenomatous polyp of colon 07/01/2023   Erectile disorder 01/22/2023   Coronary artery disease involving native coronary artery of native heart without angina pectoris 09/09/2022   Varicose 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 01/03/2022   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) 09/18/2021   Prediabetes 11/08/2020   Stage 3a chronic kidney disease (HCC) 11/08/2020   Seasonal allergies 11/08/2020   Rhinosinusitis 11/08/2020   Anticoagulant disorder (HCC)  11/07/2020   Senile purpura (HCC) 11/07/2020   MDD (major depressive disorder), recurrent episode, mild (HCC) 09/12/2019   Other spondylosis with radiculopathy, lumbar region 02/16/2019   Osteoporosis 10/12/2018   Chronic venous insufficiency 10/07/2018   Lymphedema 10/07/2018   SLE glomerulonephritis syndrome, 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HCC) 03/03/2018   Syncope and collapse 12/29/2017   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) 10/29/2016   Hyperlipidemia LDL goal <70 01/15/2016   Uncontrolled hypertension 01/15/2016   Obesity (BMI 30.0-34.9) 01/15/2016   Long term current use of anticoagulant 10/04/2015   Systemic lupus erythematosus (HCC) 05/30/2015   COPD, moderate (HCC) 06/27/2014   Shortness of breath 06/27/2014   History of pulmonary embolism 12/11/2013   Nodule of right lung 12/11/2013   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   Cystic disease of liver 08/21/2013    PCP: [name], PA-C REFERRING PROVIDER: Clois Fret, MD REFERRING DIAG: M54.12 (ICD-10-CM) - Cervical radiculopathy M54.50,G89.29 (ICD-10-CM) - Chronic bilateral low back pain without sciatica  Rationale for Evaluation and Treatment: Rehabilitation  THERAPY DIAG:  Cervicalgia - Plan: PT plan of care cert/re-cert  Radiculopathy, cervical region - Plan: PT plan of  care cert/re-cert  ONSET DATE: November 2024  SUBJECTIVE:                                                                                                                                                                                           SUBJECTIVE STATEMENT: Neck pain is about a 5/10 currently, 6/10 at worst for the past 7 days. R UE 5/10 and L UE 4/10 currently. Pt states he feels like he is making slow progress. Thinks that the manual therapy helps as well.       PERTINENT HISTORY:  Cervicalgia, low back pain. Pt also reports R > L C5/C6 dermatome to elbow bilaterally. Pt has a  difficult time turning his head and looking up. No neck surgery. Had Thoracic and lumbar spine fusion surgery. Had mid back surgery (thoracic) November 2024 due to his fall. Neck pain is more recent. Neck and shoulder pain began November 2024 after his thoracic surgery. Usually sleeps on his L side but is currently difficulty to do secondary to B shoulder and neck pain, so pt is not sleeping well. Neck pain is the same since November 2024. Pt tends to have issues getting dizzy and passes out when he is standing and exerting himself  Vision gets blurry and splotchy. Room feels like it is waving around, pt is not walking on a stable surface, feels like he is walking on a boat and not having sea legs.   The IVC filter was removed.  Still has L LE DVT. Pt states taking Elequis. L LE DVT is still being treated.    PAIN:  Are you having pain? 5/10 pain in bilat UT area of neck and shoulders Rt> Lt.   PRECAUTIONS:  -dizzy and passes out when he is standing and exerting himself; chronic orthostatic hypotension; Lt LE DVT, fall risk  RED FLAGS: Bowel or bladder incontinence: No and Cauda equina syndrome: No   WEIGHT BEARING RESTRICTIONS: No  FALLS:  Has patient fallen in last 6 months? Yes. Number of falls 2 (original fall was when pt had a heart attack November 2024 and hurt his back. February 22, 2024, pt passed out and fell into the bath tub.   LIVING ENVIRONMENT: Lives with: lives alone Lives in: House/apartment Stairs: No Has following equipment at home: Single point cane and Environmental consultant - 2 wheeled  OCCUPATION: Not currently working  PLOF: Needs assistance with homemaking   PATIENT GOALS: be able to do everything better.   NEXT MD VISIT: Yes but does not know when.   OBJECTIVE:  Note: Objective measures were completed at Evaluation unless otherwise noted.  DIAGNOSTIC FINDINGS:  MR CERVICAL SPINE WO CONTRAST 02/26/2024  Narrative & Impression  CLINICAL DATA:  Myelopathy, acute, cervical  spine   EXAM: MRI CERVICAL SPINE WITHOUT CONTRAST   TECHNIQUE: Multiplanar, multisequence MR imaging of the cervical spine was performed. No intravenous contrast was administered.   COMPARISON:  CT of the cervical spine November 05, 2023.   FINDINGS: Alignment: No substantial sagittal subluxation.   Vertebrae: No evidence of acute fracture, evidence of discitis, or suspicious bone lesion. Benign vertebral venous malformation at T3. Mild T1-T2 height loss, unchanged.   Cord: Normal cord signal.  Neck   Posterior Fossa, vertebral arteries, paraspinal tissues: Visualized vertebral artery flow voids are maintained. No paraspinal edema.   Disc levels:   C2-C3: No significant disc protrusion, foraminal stenosis, or canal stenosis.   C3-C4: No significant disc protrusion, foraminal stenosis, or canal stenosis.   C4-C5: Right eccentric posterior disc osteophyte complex with right greater than left facet and uncovertebral hypertrophy. Resulting mild right greater than left foraminal stenosis. Mild canal stenosis.   C5-C6: Posterior disc osteophyte complex ligamentum flavum thickening and right greater than left facet and uncovertebral hypertrophy. Resulting mild-to-moderate bilateral foraminal stenosis. Mild to moderate canal stenosis.   C6-C7: Posterior disc osteophyte complex with bilateral facet and uncovertebral hypertrophy. Resulting moderate bilateral foraminal stenosis. Mild canal stenosis.   C7-T1: No significant disc protrusion, foraminal stenosis, or canal stenosis.   IMPRESSION: 1. 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 2. At C5-C6, mild to moderate canal and bilateral foraminal stenosis. 3. At C4-C5, mild canal and bilateral foraminal stenosis.     Electronically Signed   By: Gilmore GORMAN Molt M.D.   On: 02/27/2024 02:42         PATIENT SURVEYS:  Neck Disability Index score: 41/50 (82%)  (05/02/2024)  COGNITION: Overall cognitive status: Within functional limits for tasks assessed     SENSATION:   MUSCLE LENGTH:   POSTURE:   PALPATION: TTP to posterior neck   CERVICAL ROM:   Active ROM A/PROM (deg) eval  Flexion WFL with posterior neck pain  Extension Very limited with pain, movement preference around C5/C6 area  Right lateral flexion Very limited with pain  Left lateral flexion Very limited with pain  Right rotation 20 degrees with pain  Left rotation 22 degrees with pain   (Blank rows = not tested)  UPPER EXTREMITY MMT:  MMT Right eval Left eval  Shoulder flexion 3+ with neck and shoulder and arm pain 4- with pain at available range.   Shoulder extension    Shoulder abduction    Shoulder adduction    Shoulder extension    Shoulder internal rotation    Shoulder external rotation    Middle trapezius    Lower trapezius (seated manually resisted) 3+ with neck pain 3+ with neck pain  Elbow flexion    Elbow extension    Wrist flexion    Wrist extension    Wrist ulnar deviation    Wrist radial deviation    Wrist pronation    Wrist supination    Grip strength     (Blank rows = not tested)  UPPER EXTREMITY ROM:  Active ROM Right eval Left eval  Shoulder flexion 55 (scaption) 55 (scaption)  Shoulder extension    Shoulder abduction    Shoulder adduction    Shoulder extension    Shoulder internal rotation    Shoulder external rotation    Elbow flexion    Elbow extension    Wrist flexion  Wrist extension    Wrist ulnar deviation    Wrist radial deviation    Wrist pronation    Wrist supination     (Blank rows = not tested)    LUMBAR ROM:   AROM eval  Flexion   Extension   Right lateral flexion   Left lateral flexion   Right rotation   Left rotation    (Blank rows = not tested)  LOWER EXTREMITY ROM:     Passive  Right eval Left eval  Hip flexion    Hip extension    Hip abduction    Hip adduction    Hip internal  rotation    Hip external rotation    Knee flexion    Knee extension    Ankle dorsiflexion    Ankle plantarflexion    Ankle inversion    Ankle eversion     (Blank rows = not tested)  LOWER EXTREMITY MMT:    MMT Right eval Left eval  Hip flexion    Hip extension    Hip abduction    Hip adduction    Hip internal rotation    Hip external rotation    Knee flexion    Knee extension    Ankle dorsiflexion    Ankle plantarflexion    Ankle inversion    Ankle eversion     (Blank rows = not tested)  GAIT: Distance walked: 30 ft Assistive device utilized: None Level of assistance: SBA Comments: antalgic, decreased stance R LE, forward flexed, decreased gait speed  TREATMENT DATE: 06/27/2024     Neuromuscular re education   At start of session: Blood pressure L arm sitting, mechanically taken, normal cuff: 125/66, HR 77 SpO2 100 %, room air   Reviewed POC: continue another 2x/week for 8 weeks secondary to pt making progress, though slow.   Sitting with gentle caudal pressure to R first rib area from PT  L cervical side bend 5x, 6x, 10x2  Seated shoulder flexion AROM 1x each UE  Seated manually resisted shoulder flexion and scapular retraction targeting the lower trap 1x each way for each UE  Seated gentle manually resisted R scapular depression isometrics 10x  Seated B scapular retraction to promote thoracic extension 10x2  Seated chin tuck 10x  Seated B scapular retraction to promote thoracic extension 5x3.  PT manual assist to get more scapular retraction     Pt almost tripped onto L foot at end of session when standing up to walk from his chair, independent recover.     Improved exercise technique, movement at target joints, use of target muscles after mod verbal, visual, tactile cues.     Manual therapy   Seated STM to B cervical paraspinal, upper trap, distal scalene and R pectoralis muscles to decrease tension          PATIENT EDUCATION:   Education details: POC Person educated: Patient Education method: Explanation Education comprehension: verbalized understanding  HOME EXERCISE PROGRAM: Pt was recommended to press his tongue at the roof of his mouth to activate his anterior cervical muscles throughout the day. Pt demonstrated and verbalized understanding.    Access Code: [email or web address] URL: [email or web address] Date: 05/10/2024 Prepared by: Emil Glassman  Exercises - Seated Cervical Flexion AROM  - 2 x daily - 7 x weekly - 3 sets - 5 reps - Seated Transversus Abdominis Bracing  - 1 x daily - 7 x weekly - 2 -3 sets - 10 reps - 5 seconds hold - Seated Chin Tuck with  Neck Elongation  - 2-3 x daily - 7 x weekly - 2-3 sets - 5 reps - Seated Cervical Sidebending AROM  - 3 x daily - 7 x weekly - 4 sets - 5 reps  ASSESSMENT:  CLINICAL IMPRESSION: Pt demonstrates overall improved neck  pain and function as well as L shoulder flexion and R and L cervical rotation AROM since initial evaluation. Pt making some progress with PT but slow. Challenges to progress include irritability or symptoms as well as his complicated medical history. Continued working on thoracic extension, as well as scapular strengthening as well as manual therapy to help decrease muscle tension to his neck. Fair tolerance to today's session. Pt will benefit from continued skilled physical therapy services to decrease pain, improve strength and function.     OBJECTIVE IMPAIRMENTS: Abnormal gait, decreased balance, decreased endurance, difficulty walking, decreased ROM, decreased strength, dizziness, impaired UE functional use, improper body mechanics, postural dysfunction, and pain.   ACTIVITY LIMITATIONS: carrying, lifting, bending, sitting, standing, squatting, sleeping, stairs, transfers, bed mobility, bathing, toileting, dressing, reach over head, hygiene/grooming, locomotion level, and caring for others  PARTICIPATION LIMITATIONS:   PERSONAL  FACTORS: Age, Fitness, Past/current experiences, Time since onset of injury/illness/exacerbation, and 3+ comorbidities: DVT, depression, dyspnea, heart attack, HTN, osteoporosis, are also affecting patient's functional outcome.   REHAB POTENTIAL: Fair    CLINICAL DECISION MAKING: Stable/uncomplicated  EVALUATION COMPLEXITY: Low   GOALS: Goals reviewed with patient? Yes  SHORT TERM GOALS: Target date: 05/13/2024  Pt will be independent with his initial HEP to improve strength, decrease pain, improve ability to lift, look around as well as ambulate and perform standing tasks with less difficulty and pain. Baseline: Pt has not yet officially started his initial HEP (05/02/2024) Goal status: ACHIEVED   LONG TERM GOALS: Target date: 07/01/2024  Pt will have a decrease in neck pain to 5/10 or less at worst to promote ability to look around more comfortably.  Baseline: 9/10 neck pain at worst for the past 3 months (05/02/2024); 6/10 at most for 7 days (06/02/2024); 06/14/24: 7-8/10 ; 6/10 at most for the past 7 days (06/27/2024) Goal status: Progressing  2.  Pt will have a decrease on low back pain to 4/10 or less at worst to promote ability to tolerate sitting, standing, walking and performing standing tasks more comfortably for his back.  Baseline: 8/10 low back pain at worst for past 7 days; 6/10 worst pain in past week 06/14/24; 7/10 at most for the past 7 days (06/27/2024) Goal status: PROGRESSING  3.  Pt will improve his Neck Disability Index (NDI) score by at least 20% as a demonstration of improved function.  Baseline: Neck Disability Index score: 41/50 (82%) (05/02/2024); 06/14/24: 58% NDI Goal status: ACHIEVED   4.  Pt will improve his cervical rotation AROM to 50 degrees or more to promote ability to look around more comfortably. Baseline:  Active ROM A/PROM (deg) eval AROM (06/02/2024)  Right rotation 20 degrees with pain 30 degrees with pain  Left rotation 22 degrees with pain 35  degrees with pain   (05/02/2024) Goal status: PROGRESSING  5.  Pt will improve B shoulder flexion AROM to 100 degrees or more to promote ability to reach as well as lift more comfortably.  Baseline:  Active ROM Right eval Left eval R (06/02/2024) L (06/02/2024) R (06/27/2024) L  (06/27/2024)  Shoulder flexion 55 (scaption) 55 (scaption) 44  (Scaption) 65  (Scaption) 56 degrees with R upper trap and lateral shoulder and  arm pain to elbow.  83 degrees with anterior shoulder and arm pain   (05/02/2024)  Goal status: ONGOING  6.  Pt will improve his shoulder flexion and lower trap strength by at least 1/2 MMT grade to promote ability to raise his arms as well as lift with less difficulty.  Baseline:  MMT Right eval Left eval R (06/27/2024) L (06/27/2024)  Shoulder flexion 3+ with neck and shoulder and arm pain 4- with pain at available range.  4-/5 manual resistance at available range with R UE pain to hand 4/5 manual resistance at available range  Lower trapezius (seated manually resisted) 3+ with neck pain 3+ with neck pain 3+ 4   (05/02/2024) Goal status: IN PROGRESS   PLAN:  PT FREQUENCY: 1-2x/week  PT DURATION: 8 weeks  PLANNED INTERVENTIONS: 97110-Therapeutic exercises, 97530- Therapeutic activity, [id number]- Neuromuscular re-education, 97535- Self Care, [id number]- Manual therapy, [id number]- Gait training, [phone]- Electrical stimulation (unattended), [phone]- Ionotophoresis 4mg /ml Dexamethasone , Patient/Family education, and Balance training.  PLAN FOR NEXT SESSION: posture, B scapular, anterior cervical, trunk and glute strengthening, mechanics, manual techniques, modalities PRN   Rohail Klees, PT, DPT 06/27/2024, 12:37 PM

## 2024-06-29 ENCOUNTER — Ambulatory Visit

## 2024-06-30 DIAGNOSIS — F332 Major depressive disorder, recurrent severe without psychotic features: Secondary | ICD-10-CM | POA: Diagnosis not present

## 2024-07-05 ENCOUNTER — Encounter: Payer: Self-pay | Admitting: Family Medicine

## 2024-07-06 ENCOUNTER — Encounter: Payer: Self-pay | Admitting: Nurse Practitioner

## 2024-07-06 ENCOUNTER — Other Ambulatory Visit: Payer: Self-pay | Admitting: Nurse Practitioner

## 2024-07-06 ENCOUNTER — Ambulatory Visit: Admitting: Nurse Practitioner

## 2024-07-06 VITALS — BP 116/82 | HR 93 | Temp 97.7°F | Resp 18 | Ht 74.0 in | Wt 214.8 lb

## 2024-07-06 DIAGNOSIS — E785 Hyperlipidemia, unspecified: Secondary | ICD-10-CM

## 2024-07-06 DIAGNOSIS — J449 Chronic obstructive pulmonary disease, unspecified: Secondary | ICD-10-CM | POA: Diagnosis not present

## 2024-07-06 DIAGNOSIS — N401 Enlarged prostate with lower urinary tract symptoms: Secondary | ICD-10-CM

## 2024-07-06 DIAGNOSIS — M818 Other osteoporosis without current pathological fracture: Secondary | ICD-10-CM

## 2024-07-06 DIAGNOSIS — G629 Polyneuropathy, unspecified: Secondary | ICD-10-CM | POA: Diagnosis not present

## 2024-07-06 DIAGNOSIS — I2510 Atherosclerotic heart disease of native coronary artery without angina pectoris: Secondary | ICD-10-CM

## 2024-07-06 DIAGNOSIS — E66811 Obesity, class 1: Secondary | ICD-10-CM

## 2024-07-06 DIAGNOSIS — K219 Gastro-esophageal reflux disease without esophagitis: Secondary | ICD-10-CM

## 2024-07-06 DIAGNOSIS — I10 Essential (primary) hypertension: Secondary | ICD-10-CM

## 2024-07-06 DIAGNOSIS — N1831 Chronic kidney disease, stage 3a: Secondary | ICD-10-CM

## 2024-07-06 DIAGNOSIS — D699 Hemorrhagic condition, unspecified: Secondary | ICD-10-CM

## 2024-07-06 DIAGNOSIS — I5032 Chronic diastolic (congestive) heart failure: Secondary | ICD-10-CM

## 2024-07-06 DIAGNOSIS — M4726 Other spondylosis with radiculopathy, lumbar region: Secondary | ICD-10-CM

## 2024-07-06 DIAGNOSIS — M329 Systemic lupus erythematosus, unspecified: Secondary | ICD-10-CM

## 2024-07-06 DIAGNOSIS — F419 Anxiety disorder, unspecified: Secondary | ICD-10-CM

## 2024-07-06 DIAGNOSIS — I825Y2 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: Secondary | ICD-10-CM

## 2024-07-06 DIAGNOSIS — G8929 Other chronic pain: Secondary | ICD-10-CM | POA: Diagnosis not present

## 2024-07-06 DIAGNOSIS — F330 Major depressive disorder, recurrent, mild: Secondary | ICD-10-CM

## 2024-07-06 MED ORDER — FAMOTIDINE 20 MG PO TABS: 20.0000 mg | ORAL_TABLET | Freq: Two times a day (BID) | ORAL | 0 refills | Status: DC

## 2024-07-06 NOTE — Progress Notes (Signed)
 BP 116/82   Pulse 93   Temp 97.7 F (36.5 C)   Resp 18   Ht 6' 2 (1.88 m)   Wt 214 lb 12.8 oz (97.4 kg)   SpO2 95%   BMI 27.58 kg/m    Subjective:    Patient ID: Jonathon Snow, male    DOB: [birth year], 63 y.o.   MRN: [id number]  HPI: Jonathon Snow is a 63 y.o. male  Chief Complaint  Patient presents with   Medical Management of Chronic Issues   Referral    [facility]    Discussed the use of AI scribe software for clinical note transcription with the patient, who gave verbal consent to proceed.  History of Present Illness Jonathon Snow is a 63 year old male with depression and anxiety wanting to go to psychiatry.   He has been experiencing severe heartburn, which has worsened since discontinuing an over-the-counter antacid due to interference with his heart medication following a heart attack. The heartburn is described as 'horrendous' and is particularly severe at night, impacting his sleep. He consumes a large amount of Tums, approximately a bottle every two days, without relief. He was prescribed pantoprazole , but it has not alleviated his symptoms.  He has a history of hypertension, coronary artery disease, and heart failure. He is currently taking losartan  50 mg daily, metoprolol  25 mg daily, and hydralazine  25 mg three times a day. Recent lab work showed a GFR of 53 and a B12 level of 185. His A1c is 5.8, in the prediabetic range.  He has chronic obstructive pulmonary disease (COPD) and reports that his breathing 'sucks.' He is taking Singulair  10 mg daily to manage his symptoms.  He has chronic back pain, spondylosis, and neuropathy, and is followed by pain management for cervical radicular pain and chronic pain syndrome. He is no longer taking gabapentin  or Lyrica.  He has a history of depression and anxiety, for which he is taking Abilify  4 mg daily, Klonopin  0.5 mg daily, hydroxyzine  10 mg every eight hours as needed, and Zoloft  150 mg daily.  He has an  anticoagulation disorder and is taking Eliquis  5 mg twice daily and Plavix  75 mg daily.  He has lupus and is taking Plaquenil 200 mg twice daily and Cellcept  500 mg twice daily.  He has benign prostatic hyperplasia (BPH) and is taking Vesicare  5 mg daily, and Flomax  0.4 mg daily. He also uses Cialis  20 mg as needed.  He has osteoporosis and obesity, and is taking rosuvastatin  20 mg daily for hyperlipidemia.         07/06/2024   10:02 AM 01/07/2024   11:08 AM 10/28/2023    1:32 PM  Depression screen PHQ 2/9  Decreased Interest 3 1 0  Down, Depressed, Hopeless 2 1 0  PHQ - 2 Score 5 2 0  Altered sleeping 3 1   Tired, decreased energy 3 3   Change in appetite 1 0   Feeling bad or failure about yourself  2 0   Trouble concentrating 1 0   Moving slowly or fidgety/restless 2 0   Suicidal thoughts 0 0   PHQ-9 Score 17 6   Difficult doing work/chores Not difficult at all      Relevant past medical, surgical, family and social history reviewed and updated as indicated. Interim medical history since our last visit reviewed. Allergies and medications reviewed and updated.  Review of Systems  Constitutional: Negative for fever or weight change.  Respiratory:  Negative for cough and shortness of breath.   Cardiovascular: Negative for chest pain or palpitations.  Gastrointestinal: Negative for abdominal pain, no bowel changes.  Musculoskeletal: Negative for gait problem or joint swelling.  Skin: Negative for rash.  Neurological: Negative for dizziness or headache.  No other specific complaints in a complete review of systems (except as listed in HPI above).      Objective:     BP 116/82   Pulse 93   Temp 97.7 F (36.5 C)   Resp 18   Ht 6' 2 (1.88 m)   Wt 214 lb 12.8 oz (97.4 kg)   SpO2 95%   BMI 27.58 kg/m    Wt Readings from Last 3 Encounters:  07/06/24 214 lb 12.8 oz (97.4 kg)  06/15/24 221 lb (100.2 kg)  05/26/24 221 lb (100.2 kg)    Physical Exam Physical  Exam GENERAL: Alert, cooperative, well developed, no acute distress HEENT: Normocephalic, normal oropharynx, moist mucous membranes CHEST: Clear to auscultation bilaterally, no wheezes, rhonchi, or crackles CARDIOVASCULAR: Normal heart rate and rhythm, S1 and S2 normal without murmurs ABDOMEN: Soft, non-tender, non-distended, without organomegaly, normal bowel sounds EXTREMITIES: No cyanosis or edema NEUROLOGICAL: Cranial nerves grossly intact, moves all extremities without gross motor or sensory deficit   Results for orders placed or performed in visit on 03/29/24  BLADDER SCAN AMB NON-IMAGING   Collection Time: 03/29/24  8:51 AM  Result Value Ref Range   Scan Result 0 ml    *Note: Due to a large number of results and/or encounters for the requested time period, some results have not been displayed. A complete set of results can be found in Results Review.          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)   Coronary artery disease involving native coronary artery of native heart without angina pectoris   Chronic heart failure with preserved ejection fraction (HFpEF) (HCC)   Essential hypertension - Primary     Respiratory   COPD, moderate (HCC)     Nervous and Auditory   Other spondylosis with radiculopathy, lumbar region   Chronic bilateral low back pain with left-sided sciatica   Neuropathy     Musculoskeletal and Integument   Osteoporosis     Genitourinary   Stage 3a chronic kidney disease (HCC)   BPH (benign prostatic hyperplasia)     Hematopoietic and Hemostatic   Anticoagulant disorder (HCC)     Other   Systemic lupus erythematosus (HCC)   Hyperlipidemia LDL goal <70   Obesity (BMI 30.0-34.9)   MDD (major depressive disorder), recurrent episode, mild (HCC)   Relevant Orders   Ambulatory referral to  Psychiatry   Anxiety   Relevant Orders   Ambulatory referral to Psychiatry   Other Visit Diagnoses       Gastroesophageal reflux disease without esophagitis       Relevant Medications   famotidine  (PEPCID ) 20 MG tablet        Assessment and Plan Assessment & Plan Gastroesophageal reflux disease (GERD) refractory to current therapy GERD symptoms are severe and primarily nocturnal. Current treatment with pantoprazole  is ineffective. He reports excessive use of Tums for symptom relief. - Add famotidine  for one month to assess effectiveness in conjunction with pantoprazole . - Instruct him to report any issues with the new medication.  Depression  and anxiety disorder Depression and anxiety are currently managed with Abilify , Klonopin , and Zoloft . He requests a referral to psychiatry for further management. - Provide referral to psychiatry for further evaluation and management.  COPD -no changes  Chf/CAD/HLD/chroinc DVT/HTN -managed by cardiology -continue current treatment plan -eliquis  5 mg BID, plavix  75 mg daily, hydralazine  25 mg three times a day, losartan  50 mg daily, metoprolol  25 mg daily, rosuvastatin  20 mg daily  BPH -managed by urology -continue Vesicare  5 mg daily, and Flomax  0.4 mg daily. He also uses Cialis  20 mg as needed.  Lupus -managed by rheumatology -continue plaquenil 200 mg BID        Follow up plan: Return in about 6 months (around 01/06/2025) for follow up with [name].

## 2024-07-07 ENCOUNTER — Ambulatory Visit

## 2024-07-07 ENCOUNTER — Telehealth: Payer: Self-pay

## 2024-07-07 ENCOUNTER — Ambulatory Visit: Admitting: Neurosurgery

## 2024-07-07 ENCOUNTER — Ambulatory Visit
Attending: Student in an Organized Health Care Education/Training Program | Admitting: Student in an Organized Health Care Education/Training Program

## 2024-07-07 ENCOUNTER — Encounter: Payer: Self-pay | Admitting: Student in an Organized Health Care Education/Training Program

## 2024-07-07 VITALS — BP 146/82 | HR 72 | Temp 98.0°F | Resp 17 | Ht 74.0 in | Wt 215.0 lb

## 2024-07-07 DIAGNOSIS — M47812 Spondylosis without myelopathy or radiculopathy, cervical region: Secondary | ICD-10-CM | POA: Insufficient documentation

## 2024-07-07 DIAGNOSIS — M5412 Radiculopathy, cervical region: Secondary | ICD-10-CM

## 2024-07-07 DIAGNOSIS — F332 Major depressive disorder, recurrent severe without psychotic features: Secondary | ICD-10-CM | POA: Diagnosis not present

## 2024-07-07 DIAGNOSIS — G894 Chronic pain syndrome: Secondary | ICD-10-CM | POA: Diagnosis not present

## 2024-07-07 DIAGNOSIS — Z981 Arthrodesis status: Secondary | ICD-10-CM | POA: Insufficient documentation

## 2024-07-07 DIAGNOSIS — M542 Cervicalgia: Secondary | ICD-10-CM | POA: Diagnosis not present

## 2024-07-07 NOTE — Telephone Encounter (Signed)
 Requested medication (s) are due for refill today: signed yesterday for #60 day supply   Requested medication (s) are on the active medication list: yes   Last refill:  07/06/24 #60 0 refills  Future visit scheduled: no   Notes to clinic:  requesting #90 supply     Requested Prescriptions  Pending Prescriptions Disp Refills   famotidine  (PEPCID ) 20 MG tablet [Pharmacy Med Name: FAMOTIDINE  20MG  TABLETS] 180 tablet     Sig: TAKE 1 TABLET(20 MG) BY MOUTH TWICE DAILY     Gastroenterology:  H2 Antagonists Passed - 07/07/2024  4:08 PM      Passed - Valid encounter within last 12 months    Recent Outpatient Visits           Yesterday Essential hypertension   [facility] Gareth Mliss FALCON, FNP   3 months ago Otalgia, left   [facility] [name], PA-C   3 months ago Non-recurrent acute suppurative otitis media of left ear without spontaneous rupture of tympanic membrane   [facility] Gareth Mliss FALCON, FNP   4 months ago Uncontrolled hypertension   [facility] [name], PA-C   5 months ago Hypertension, unspecified type   [facility] [name], PA-C       Future Appointments             In 5 days Hester Alm BROCKS, MD [facility]   In 8 months Maurine Lukes, [facility] [place]

## 2024-07-07 NOTE — Therapy (Signed)
 OUTPATIENT PHYSICAL THERAPY TREATMENT   Patient Name: COLBIE DANNER MRN: [id number] DOB:05-18-1961, 63 y.o., male Today's Date: 07/07/2024  END OF SESSION:  PT End of Session - 07/07/24 1120     Visit Number 14    Number of Visits 33    Date for PT Re-Evaluation 08/26/24    Authorization Type HB auth#[id number] for 4 PT vsts from 7/9-8/7    Authorization - Visit Number 1    Authorization - Number of Visits 4    PT Start Time 1121    PT Stop Time 1201    PT Time Calculation (min) 40 min    Activity Tolerance Patient tolerated treatment well;Patient limited by pain    Behavior During Therapy [facility] for tasks assessed/performed                       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   Overview:  superior sagittal sinus, left transverse sinus and cortical veins    Closed left hip fracture, initial encounter (HCC) 02/18/2023   COVID-19 virus infection 12/2020   Depression    DVT (deep venous thrombosis) (HCC)    Dyspnea    GERD (gastroesophageal reflux disease)    Head injury 11/05/2023   Heart attack (HCC) 11/05/2023   Heel spur, left 02/16/2019   Heel spur, right 02/16/2019   Hematoma of groin 11/08/2023   Herpes zoster infection of lumbar region 02/20/2020   History of DVT (deep vein thrombosis) 03/22/2020   History of kidney stones    Hyperlipidemia    Hypertension    Long term current use of systemic steroids 11/08/2020   Lupus    Lymphedema 10/07/2018   Morbid obesity (HCC)    Opiate abuse, episodic (HCC) 02/26/2018   Osteoporosis    Pain and swelling of right lower extremity 02/03/2023   Pneumonia    PONV (postoperative nausea and vomiting)    Postphlebitic syndrome with ulcer, 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   Presence of  IVC filter 03/22/2020   Removed   Pulmonary embolism (HCC)    Renal disorder    Stage III   Severe episode of recurrent major depressive disorder, 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   Shock circulatory (HCC) 11/06/2023   STEMI (ST elevation myocardial infarction) (HCC) 11/05/2023   Past Surgical History:  Procedure Laterality Date   ANKLE SURGERY Right    BACK SURGERY     BRONCHIAL WASHINGS N/A 11/01/2021   Procedure: BRONCHIAL WASHINGS;  Surgeon: Parris Manna, MD;  Location: [facility] ORS;  Service: Thoracic;  Laterality: N/A;   COLONOSCOPY WITH PROPOFOL  N/A 05/28/2020   Procedure: COLONOSCOPY WITH PROPOFOL ;  Surgeon: Therisa Bi, MD;  Location: [facility] ENDOSCOPY;  Service: Endoscopy;  Laterality: N/A;   COLONOSCOPY WITH PROPOFOL  N/A 07/01/2023   Procedure: COLONOSCOPY WITH PROPOFOL ;  Surgeon: Therisa Bi, MD;  Location: [facility] ENDOSCOPY;  Service: Gastroenterology;  Laterality: N/A;   CORONARY ULTRASOUND/IVUS N/A 11/05/2023   Procedure: Coronary Ultrasound/IVUS;  Surgeon: Mady Bruckner, MD;  Location: [facility] INVASIVE CV LAB;  Service: Cardiovascular;  Laterality: N/A;   CORONARY/GRAFT ACUTE MI REVASCULARIZATION N/A 11/05/2023   Procedure: Coronary/Graft Acute MI Revascularization;  Surgeon: Mady Bruckner, MD;  Location: [facility] INVASIVE CV LAB;  Service: Cardiovascular;  Laterality: N/A;   CYST EXCISION  92 or  93    Liver cyst removal [facility]   FLEXIBLE BRONCHOSCOPY N/A 11/01/2021   Procedure: FLEXIBLE BRONCHOSCOPY;  Surgeon: Parris Manna, MD;  Location: [facility] ORS;  Service: Thoracic;  Laterality: N/A;   HIP PINNING,CANNULATED Left 02/19/2023   Procedure: PERCUTANEOUS FIXATION OF FEMORAL NECK;  Surgeon: Tobie Priest, MD;  Location: [facility] ORS;  Service: Orthopedics;  Laterality: Left;   I & D EXTREMITY Right 04/29/2017   Procedure: IRRIGATION AND DEBRIDEMENT EXTREMITY;  Surgeon: Shelva Dunnings, MD;  Location: [facility] ORS;  Service: General;  Laterality: Right;   IRRIGATION AND  DEBRIDEMENT ABSCESS Left 04/29/2017   Procedure: IRRIGATION AND DEBRIDEMENT Scrotal ABSCESS;  Surgeon: Shelva Dunnings, MD;  Location: [facility] ORS;  Service: General;  Laterality: Left;   LEFT HEART CATH AND CORONARY ANGIOGRAPHY N/A 11/05/2023   Procedure: LEFT HEART CATH AND CORONARY ANGIOGRAPHY;  Surgeon: Mady Bruckner, MD;  Location: [facility] INVASIVE CV LAB;  Service: Cardiovascular;  Laterality: N/A;   POLYPECTOMY  07/01/2023   Procedure: POLYPECTOMY;  Surgeon: Therisa Bi, MD;  Location: [facility] ENDOSCOPY;  Service: Gastroenterology;;   Patient Active Problem List   Diagnosis Date Noted   Cervical radicular pain 05/26/2024   History of thoracic spinal fusion (T7-T12) 05/26/2024   History of lumbar spinal fusion (L5-S1) 05/26/2024   Chronic pain syndrome 05/26/2024   Anxiety and depression 02/26/2024   Dyslipidemia 02/26/2024   Peripheral neuropathy 02/26/2024   Essential hypertension 02/26/2024   Vertigo 02/26/2024   Syncope 02/25/2024   Orthostatic hypotension 02/11/2024   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) 01/20/2024   Anxiety 12/14/2023   Overweight (BMI 25.0-29.9) 11/23/2023   Chronic heart failure with preserved ejection fraction (HFpEF) (HCC) 11/23/2023   BPH (benign prostatic hyperplasia) 11/23/2023   Unstable angina (HCC) 11/10/2023   Closed T10 spinal fracture (HCC) 11/06/2023   Closed tricolumnar fracture of thoracic vertebra (HCC) 11/06/2023   Thoracic spine instability 11/06/2023   Chronic bilateral low back pain with left-sided sciatica 07/07/2023   Neuropathy 07/07/2023   Hx of colonic polyps 07/01/2023   Adenomatous polyp of colon 07/01/2023   Erectile disorder 01/22/2023   Coronary artery disease involving native coronary artery of native heart without angina pectoris 09/09/2022   Varicose 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 01/03/2022   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) 09/18/2021   Prediabetes  11/08/2020   Stage 3a chronic kidney disease (HCC) 11/08/2020   Seasonal allergies 11/08/2020   Rhinosinusitis 11/08/2020   Anticoagulant disorder (HCC) 11/07/2020   Senile purpura (HCC) 11/07/2020   MDD (major depressive disorder), recurrent episode, mild (HCC) 09/12/2019   Other spondylosis with radiculopathy, lumbar region 02/16/2019   Osteoporosis 10/12/2018   Chronic venous insufficiency 10/07/2018   Lymphedema 10/07/2018   SLE glomerulonephritis syndrome, 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HCC) 03/03/2018   Syncope and collapse 12/29/2017   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) 10/29/2016   Hyperlipidemia LDL goal <70 01/15/2016   Uncontrolled hypertension 01/15/2016   Obesity (BMI 30.0-34.9) 01/15/2016   Long term current use of anticoagulant 10/04/2015   Systemic lupus erythematosus (HCC) 05/30/2015   COPD, moderate (HCC) 06/27/2014   Shortness of breath 06/27/2014   History of pulmonary embolism 12/11/2013   Nodule of right lung 12/11/2013   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   Cystic disease of liver 08/21/2013    PCP: [name], PA-C REFERRING PROVIDER: Clois Fret, MD REFERRING DIAG: [phone] (ICD-10-CM) - Cervical radiculopathy M54.50,G89.29 (ICD-10-CM) - Chronic bilateral low  back pain without sciatica  Rationale for Evaluation and Treatment: Rehabilitation  THERAPY DIAG:  Cervicalgia  Radiculopathy, cervical region  ONSET DATE: November 2024  SUBJECTIVE:                                                                                                                                                                                           SUBJECTIVE STATEMENT: Neck and UE are pretty rough. 7/10 neck, R and L UE pain currently. Got the injection his neck 2.5 weeks ago with temporary relief.         PERTINENT HISTORY:  Cervicalgia, low back pain. Pt also reports R > L C5/C6 dermatome to elbow  bilaterally. Pt has a difficult time turning his head and looking up. No neck surgery. Had Thoracic and lumbar spine fusion surgery. Had mid back surgery (thoracic) November 2024 due to his fall. Neck pain is more recent. Neck and shoulder pain began November 2024 after his thoracic surgery. Usually sleeps on his L side but is currently difficulty to do secondary to B shoulder and neck pain, so pt is not sleeping well. Neck pain is the same since November 2024. Pt tends to have issues getting dizzy and passes out when he is standing and exerting himself  Vision gets blurry and splotchy. Room feels like it is waving around, pt is not walking on a stable surface, feels like he is walking on a boat and not having sea legs.   The IVC filter was removed.  Still has L LE DVT. Pt states taking Elequis. L LE DVT is still being treated.    PAIN:  Are you having pain? 5/10 pain in bilat UT area of neck and shoulders Rt> Lt.   PRECAUTIONS:  -dizzy and passes out when he is standing and exerting himself; chronic orthostatic hypotension; Lt LE DVT, fall risk  RED FLAGS: Bowel or bladder incontinence: No and Cauda equina syndrome: No   WEIGHT BEARING RESTRICTIONS: No  FALLS:  Has patient fallen in last 6 months? Yes. Number of falls 2 (original fall was when pt had a heart attack November 2024 and hurt his back. February 22, 2024, pt passed out and fell into the bath tub.   LIVING ENVIRONMENT: Lives with: lives alone Lives in: House/apartment Stairs: No Has following equipment at home: Single point cane and Environmental consultant - 2 wheeled  OCCUPATION: Not currently working  PLOF: Needs assistance with homemaking   PATIENT GOALS: be able to do everything better.   NEXT MD VISIT: Yes but does not know when.   OBJECTIVE:  Note: Objective measures were completed at Evaluation unless otherwise noted.  DIAGNOSTIC FINDINGS:  MR CERVICAL SPINE WO CONTRAST 02/26/2024  Narrative & Impression  CLINICAL DATA:   Myelopathy, acute, cervical spine   EXAM: MRI CERVICAL SPINE WITHOUT CONTRAST   TECHNIQUE: Multiplanar, multisequence MR imaging of the cervical spine was performed. No intravenous contrast was administered.   COMPARISON:  CT of the cervical spine November 05, 2023.   FINDINGS: Alignment: No substantial sagittal subluxation.   Vertebrae: No evidence of acute fracture, evidence of discitis, or suspicious bone lesion. Benign vertebral venous malformation at T3. Mild T1-T2 height loss, unchanged.   Cord: Normal cord signal.  Neck   Posterior Fossa, vertebral arteries, paraspinal tissues: Visualized vertebral artery flow voids are maintained. No paraspinal edema.   Disc levels:   C2-C3: No significant disc protrusion, foraminal stenosis, or canal stenosis.   C3-C4: No significant disc protrusion, foraminal stenosis, or canal stenosis.   C4-C5: Right eccentric posterior disc osteophyte complex with right greater than left facet and uncovertebral hypertrophy. Resulting mild right greater than left foraminal stenosis. Mild canal stenosis.   C5-C6: Posterior disc osteophyte complex ligamentum flavum thickening and right greater than left facet and uncovertebral hypertrophy. Resulting mild-to-moderate bilateral foraminal stenosis. Mild to moderate canal stenosis.   C6-C7: Posterior disc osteophyte complex with bilateral facet and uncovertebral hypertrophy. Resulting moderate bilateral foraminal stenosis. Mild canal stenosis.   C7-T1: No significant disc protrusion, foraminal stenosis, or canal stenosis.   IMPRESSION: 1. 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 2. At C5-C6, mild to moderate canal and bilateral foraminal stenosis. 3. At C4-C5, mild canal and bilateral foraminal stenosis.     Electronically Signed   By: Gilmore GORMAN Molt M.D.   On: 02/27/2024 02:42         PATIENT SURVEYS:  Neck Disability Index score: 41/50 (82%)  (05/02/2024)  COGNITION: Overall cognitive status: Within functional limits for tasks assessed     SENSATION:   MUSCLE LENGTH:   POSTURE:   PALPATION: TTP to posterior neck   CERVICAL ROM:   Active ROM A/PROM (deg) eval  Flexion WFL with posterior neck pain  Extension Very limited with pain, movement preference around C5/C6 area  Right lateral flexion Very limited with pain  Left lateral flexion Very limited with pain  Right rotation 20 degrees with pain  Left rotation 22 degrees with pain   (Blank rows = not tested)  UPPER EXTREMITY MMT:  MMT Right eval Left eval  Shoulder flexion 3+ with neck and shoulder and arm pain 4- with pain at available range.   Shoulder extension    Shoulder abduction    Shoulder adduction    Shoulder extension    Shoulder internal rotation    Shoulder external rotation    Middle trapezius    Lower trapezius (seated manually resisted) 3+ with neck pain 3+ with neck pain  Elbow flexion    Elbow extension    Wrist flexion    Wrist extension    Wrist ulnar deviation    Wrist radial deviation    Wrist pronation    Wrist supination    Grip strength     (Blank rows = not tested)  UPPER EXTREMITY ROM:  Active ROM Right eval Left eval  Shoulder flexion 55 (scaption) 55 (scaption)  Shoulder extension    Shoulder abduction    Shoulder adduction    Shoulder extension    Shoulder internal rotation    Shoulder external rotation    Elbow flexion    Elbow extension  Wrist flexion    Wrist extension    Wrist ulnar deviation    Wrist radial deviation    Wrist pronation    Wrist supination     (Blank rows = not tested)    LUMBAR ROM:   AROM eval  Flexion   Extension   Right lateral flexion   Left lateral flexion   Right rotation   Left rotation    (Blank rows = not tested)  LOWER EXTREMITY ROM:     Passive  Right eval Left eval  Hip flexion    Hip extension    Hip abduction    Hip adduction    Hip internal  rotation    Hip external rotation    Knee flexion    Knee extension    Ankle dorsiflexion    Ankle plantarflexion    Ankle inversion    Ankle eversion     (Blank rows = not tested)  LOWER EXTREMITY MMT:    MMT Right eval Left eval  Hip flexion    Hip extension    Hip abduction    Hip adduction    Hip internal rotation    Hip external rotation    Knee flexion    Knee extension    Ankle dorsiflexion    Ankle plantarflexion    Ankle inversion    Ankle eversion     (Blank rows = not tested)  GAIT: Distance walked: 30 ft Assistive device utilized: None Level of assistance: SBA Comments: antalgic, decreased stance R LE, forward flexed, decreased gait speed  TREATMENT DATE: 07/07/2024     Neuromuscular re education   At start of session: Blood pressure L arm sitting, mechanically taken, normal cuff: 137/87, HR 72 SpO2 100 %, room air   Sitting with gentle caudal pressure to R first rib area from PT  L cervical side bend 10x2  Seated chin tuck in slight cervical flexion 10x3  Seated B scapular retraction to promote thoracic extension 10x3   Seated gentle R scapular depression isometrics (forearm at arm rest) 10x2  Seated manually resisted R scapular retraction targeting the lower trap muscle 2 seconds 5x6  Slight decrease in R lateral neck pain afterwards   Seated chin tuck in slight cervical flexion 10x3    Improved exercise technique, movement at target joints, use of target muscles after mod verbal, visual, tactile cues.    Manual therapy   Seated STM to R rhomboid, infraspinatus,  R pectoralis muscle to decrease tension        PATIENT EDUCATION:  Education details: POC Person educated: Patient Education method: Explanation Education comprehension: verbalized understanding  HOME EXERCISE PROGRAM: Pt was recommended to press his tongue at the roof of his mouth to activate his anterior cervical muscles throughout the day. Pt demonstrated and  verbalized understanding.    Access Code: [email or web address] URL: [email or web address] Date: 05/10/2024 Prepared by: Emil Glassman  Exercises - Seated Cervical Flexion AROM  - 2 x daily - 7 x weekly - 3 sets - 5 reps - Seated Transversus Abdominis Bracing  - 1 x daily - 7 x weekly - 2 -3 sets - 10 reps - 5 seconds hold - Seated Chin Tuck with Neck Elongation  - 2-3 x daily - 7 x weekly - 2-3 sets - 5 reps - Seated Cervical Sidebending AROM  - 3 x daily - 7 x weekly - 4 sets - 5 reps  ASSESSMENT:  CLINICAL IMPRESSION: Worked on improving thoracic extension and decreasing muscle tension  to R rhomboid, infraspinatus, and pectoralis muscle to help decrease stress to his neck and UE nerves. Fair tolerance to today's session. Pt will benefit from continued skilled physical therapy services to decrease pain, improve strength and function.     OBJECTIVE IMPAIRMENTS: Abnormal gait, decreased balance, decreased endurance, difficulty walking, decreased ROM, decreased strength, dizziness, impaired UE functional use, improper body mechanics, postural dysfunction, and pain.   ACTIVITY LIMITATIONS: carrying, lifting, bending, sitting, standing, squatting, sleeping, stairs, transfers, bed mobility, bathing, toileting, dressing, reach over head, hygiene/grooming, locomotion level, and caring for others  PARTICIPATION LIMITATIONS:   PERSONAL FACTORS: Age, Fitness, Past/current experiences, Time since onset of injury/illness/exacerbation, and 3+ comorbidities: DVT, depression, dyspnea, heart attack, HTN, osteoporosis, are also affecting patient's functional outcome.   REHAB POTENTIAL: Fair    CLINICAL DECISION MAKING: Stable/uncomplicated  EVALUATION COMPLEXITY: Low   GOALS: Goals reviewed with patient? Yes  SHORT TERM GOALS: Target date: 05/13/2024  Pt will be independent with his initial HEP to improve strength, decrease pain, improve ability to lift, look around as well as ambulate and  perform standing tasks with less difficulty and pain. Baseline: Pt has not yet officially started his initial HEP (05/02/2024) Goal status: ACHIEVED   LONG TERM GOALS: Target date: 07/01/2024  Pt will have a decrease in neck pain to 5/10 or less at worst to promote ability to look around more comfortably.  Baseline: 9/10 neck pain at worst for the past 3 months (05/02/2024); 6/10 at most for 7 days (06/02/2024); 06/14/24: 7-8/10 ; 6/10 at most for the past 7 days (06/27/2024) Goal status: Progressing  2.  Pt will have a decrease on low back pain to 4/10 or less at worst to promote ability to tolerate sitting, standing, walking and performing standing tasks more comfortably for his back.  Baseline: 8/10 low back pain at worst for past 7 days; 6/10 worst pain in past week 06/14/24; 7/10 at most for the past 7 days (06/27/2024) Goal status: PROGRESSING  3.  Pt will improve his Neck Disability Index (NDI) score by at least 20% as a demonstration of improved function.  Baseline: Neck Disability Index score: 41/50 (82%) (05/02/2024); 06/14/24: 58% NDI Goal status: ACHIEVED   4.  Pt will improve his cervical rotation AROM to 50 degrees or more to promote ability to look around more comfortably. Baseline:  Active ROM A/PROM (deg) eval AROM (06/02/2024)  Right rotation 20 degrees with pain 30 degrees with pain  Left rotation 22 degrees with pain 35 degrees with pain   (05/02/2024) Goal status: PROGRESSING  5.  Pt will improve B shoulder flexion AROM to 100 degrees or more to promote ability to reach as well as lift more comfortably.  Baseline:  Active ROM Right eval Left eval R (06/02/2024) L (06/02/2024) R (06/27/2024) L  (06/27/2024)  Shoulder flexion 55 (scaption) 55 (scaption) 44  (Scaption) 65  (Scaption) 56 degrees with R upper trap and lateral shoulder and arm pain to elbow.  83 degrees with anterior shoulder and arm pain   (05/02/2024)  Goal status: ONGOING  6.  Pt will improve his shoulder  flexion and lower trap strength by at least 1/2 MMT grade to promote ability to raise his arms as well as lift with less difficulty.  Baseline:  MMT Right eval Left eval R (06/27/2024) L (06/27/2024)  Shoulder flexion 3+ with neck and shoulder and arm pain 4- with pain at available range.  4-/5 manual resistance at available range with R UE pain to hand  4/5 manual resistance at available range  Lower trapezius (seated manually resisted) 3+ with neck pain 3+ with neck pain 3+ 4   (05/02/2024) Goal status: IN PROGRESS   PLAN:  PT FREQUENCY: 1-2x/week  PT DURATION: 8 weeks  PLANNED INTERVENTIONS: 97110-Therapeutic exercises, 97530- Therapeutic activity, [id number]- Neuromuscular re-education, 97535- Self Care, [id number]- Manual therapy, [id number]- Gait training, [phone]- Electrical stimulation (unattended), [id number]- Ionotophoresis 4mg /ml Dexamethasone , Patient/Family education, and Balance training.  PLAN FOR NEXT SESSION: posture, B scapular, anterior cervical, trunk and glute strengthening, mechanics, manual techniques, modalities PRN   Rayla Pember, PT, DPT 07/07/2024, 12:31 PM

## 2024-07-07 NOTE — Progress Notes (Signed)
 Safety precautions to be maintained throughout the outpatient stay will include: orient to surroundings, keep bed in low position, maintain call bell within reach at all times, provide assistance with transfer out of bed and ambulation.

## 2024-07-07 NOTE — Telephone Encounter (Signed)
 Dr Marcelino would like clearance for this patient to stop Plavix  for 7 days and  Eliquis  for 3 days and bridge with Aspirin  81 mg during this time.  The patient will be scheduled for a Cervical Facet Block once we can get the clearance.  Thank you

## 2024-07-07 NOTE — Progress Notes (Signed)
 PROVIDER NOTE: Interpretation of information contained herein should be left to medically-trained personnel. Specific patient instructions are provided elsewhere under Patient Instructions section of medical record. This document was created in part using AI and STT-dictation technology, any transcriptional errors that may result from this process are unintentional.  Patient: Jonathon Snow  Service: E/M   PCP: [name], PA-C  DOB: 09-25-1961  DOS: 07/07/2024  Provider: Wallie Sherry, MD  MRN: [id number]  Delivery: Face-to-face  Specialty: Interventional Pain Management  Type: Established Patient  Setting: Ambulatory outpatient facility  Specialty designation: 09  Referring Prov.: [name], PA-C  Location: Outpatient office facility       History of present illness (HPI) Jonathon Snow, a 63 y.o. year old male, is here today because of his Cervical facet joint syndrome [M47.812]. Mr. Morey primary complain today is Neck Pain and Shoulder Pain (Bilat - sometimes to hands)   Pain Assessment: Severity of Chronic pain is reported as a 7 /10. Location: Neck  /to shoulders bilat to hands and sometimes fingers bilat. Onset: More than a month ago. Quality: Aching. Timing: Constant. Modifying factor(s): denies. Vitals:  height is 6' 2 (1.88 m) and weight is 215 lb (97.5 kg). His temperature is 98 F (36.7 C). His blood pressure is 146/82 (abnormal) and his pulse is 72. His respiration is 17 and oxygen saturation is 100%.  BMI: Estimated body mass index is 27.6 kg/m as calculated from the following:   Height as of this encounter: 6' 2 (1.88 m).   Weight as of this encounter: 215 lb (97.5 kg).  Last encounter: 05/26/2024. Last procedure: 06/15/2024.  Reason for encounter:   Post-Procedure Evaluation   Type: Cervical Epidural Steroid injection (CESI) (Interlaminar) #1  Laterality: Right (-RT)  Level: C7-T1 DOS: 06/15/2024  Provider: Wallie Sherry, MD Imaging: Fluoroscopy-guided Spinal  ([id number]) Anesthesia: Local anesthesia (1-2% Lidocaine )  Medical Necessity Purpose: Diagnostic/Therapeutic Rationale (medical necessity): procedure needed and proper for the diagnosis and/or treatment of Mr. Dilone medical symptoms and needs. Indications: Cervicalgia, cervical radicular pain, degenerative disc disease, severe enough to impact quality of life or function. 1. Cervical radicular pain   2. Chronic pain syndrome    NAS-11 Pain score:   Pre-procedure: 6 /10   Post-procedure: 3 /10     Effectiveness:  Initial hour after procedure: 30 %  Subsequent 4-6 hours post-procedure: 30 %  Analgesia past initial 6 hours: 0 % (pt states he experienced increased benefit after a few days and began to increase activitiy. Pain now pre-proced. level)  Ongoing improvement:  Analgesic:  0%    No results found for: D9THCCBX  ROS  Constitutional: Denies any fever or chills Gastrointestinal: No reported hemesis, hematochezia, vomiting, or acute GI distress Musculoskeletal: Cervicalgia Neurological: No reported episodes of acute onset apraxia, aphasia, dysarthria, agnosia, amnesia, paralysis, loss of coordination, or loss of consciousness  Medication Review  ARIPiprazole , SYRINGE 3CC/25GX1, apixaban , azelastine, carbidopa-levodopa, cholecalciferol , clonazePAM , clopidogrel , cyanocobalamin , famotidine , fluticasone , hydrALAZINE , hydrOXYzine , hydroxychloroquine, loratadine , losartan , meclizine , metoprolol  succinate, montelukast , multivitamin with minerals, mycophenolate , oxybutynin , pantoprazole , rosuvastatin , sertraline , solifenacin , tadalafil , tamsulosin , and traMADol   History Review  Allergy: Jonathon Snow is allergic to enalapril and vicodin [hydrocodone -acetaminophen ]. Drug: Jonathon Snow  reports no history of drug use. Alcohol:  reports no history of alcohol use. Tobacco:  reports that he has never smoked. He has never used smokeless tobacco. Social: Jonathon Snow  reports that he has  never smoked. He has never used smokeless tobacco. He reports that he does not drink alcohol  and does not use drugs. 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), Closed left hip fracture, initial encounter (HCC) (02/18/2023), COVID-19 virus infection (12/2020), Depression, DVT (deep venous thrombosis) (HCC), Dyspnea, GERD (gastroesophageal reflux disease), Head injury (11/05/2023), Heart attack (HCC) (11/05/2023), Heel spur, left (02/16/2019), Heel spur, right (02/16/2019), Hematoma of groin (11/08/2023), Herpes zoster infection of lumbar region (02/20/2020), History of DVT (deep vein thrombosis) (03/22/2020), History of kidney stones, Hyperlipidemia, Hypertension, Long term current use of systemic steroids (11/08/2020), Lupus, Lymphedema (10/07/2018), Morbid obesity (HCC), Opiate abuse, episodic (HCC) (02/26/2018), Osteoporosis, Pain and swelling of right lower extremity (02/03/2023), Pneumonia, PONV (postoperative nausea and vomiting), Postphlebitic syndrome with ulcer, 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 Surgical: Jonathon Snow  has a past surgical history that includes Cyst excision (92 or 93 ); Ankle surgery (Right); I & D extremity (Right, 04/29/2017); Irrigation and debridement abscess (Left, 04/29/2017); Colonoscopy with propofol  (N/A, 05/28/2020); Flexible bronchoscopy (N/A, 11/01/2021); Bronchial washings (N/A, 11/01/2021); Back surgery; Hip pinning, cannulated (Left, 02/19/2023); Colonoscopy with propofol  (N/A,  07/01/2023); polypectomy (07/01/2023); Coronary/Graft Acute MI Revascularization (N/A, 11/05/2023); LEFT HEART CATH AND CORONARY ANGIOGRAPHY (N/A, 11/05/2023); and Coronary Ultrasound/IVUS (N/A, 11/05/2023). Family: family history includes Clotting disorder in his mother; Heart attack in his maternal grandfather, maternal grandmother, and paternal grandfather; Heart disease in his father; Hypertension in his father; Kidney disease in his brother.  Laboratory Chemistry Profile   Renal Lab Results  Component Value Date   BUN 30 (H) 02/27/2024   CREATININE 1.57 (H) 02/27/2024   LABCREA 153 11/08/2023   BCR 16 02/10/2024   GFRAA 53 (L) 11/07/2020   GFRNONAA 50 (L) 02/27/2024    Hepatic Lab Results  Component Value Date   AST 15 02/16/2024   ALT 15 02/16/2024   ALBUMIN  3.7 02/16/2024   ALKPHOS 91 02/16/2024   LIPASE 34 11/05/2023    Electrolytes Lab Results  Component Value Date   NA 136 02/27/2024   K 4.0 02/27/2024   CL 110 02/27/2024   CALCIUM  8.6 (L) 02/27/2024   MG 2.0 11/06/2023   PHOS 3.8 11/06/2023    Bone Lab Results  Component Value Date   VD25OH 35 11/15/2018    Inflammation (CRP: Acute Phase) (ESR: Chronic Phase) Lab Results  Component Value Date   CRP 0.8 09/10/2021   ESRSEDRATE 25 (H) 09/10/2021   LATICACIDVEN 1.3 08/22/2021         Note: Above Lab results reviewed.  Recent Imaging Review  DG PAIN CLIN[facility] C-ARM 1-60 MIN NO REPORT Fluoro was used, but no Radiologist interpretation will be provided.  Please refer to NOTES tab for provider progress note. Note: Reviewed        Physical Exam  Vitals: BP (!) 146/82   Pulse 72   Temp 98 F (36.7 C)   Resp 17   Ht 6' 2 (1.88 m)   Wt 215 lb (97.5 kg)   SpO2 100%   BMI 27.60 kg/m  BMI: Estimated body mass index is 27.6 kg/m as calculated from the following:   Height as of this encounter: 6' 2 (1.88 m).   Weight as of this encounter: 215 lb (97.5 kg). Ideal: Ideal  body weight: 82.2 kg (181 lb 3.5  oz) Adjusted ideal body weight: 88.3 kg (194 lb 11.7 oz) General appearance: Well nourished, well developed, and well hydrated. In no apparent acute distress Mental status: Alert, oriented x 3 (person, place, & time)       Respiratory: No evidence of acute respiratory distress Eyes: PERLA  Cervicalgia, cervical pain with cervical facet loading  Assessment   Diagnosis Status  1. Cervical facet joint syndrome   2. Cervical radicular pain   3. Chronic pain syndrome   4. History of lumbar spinal fusion (L5-S1)   5. Cervical spondylosis without myelopathy    Having a Flare-up Having a Flare-up Controlled   Updated Problems: No problems updated.  Plan of Care  OVILA LEPAGE has a history of greater than 3 months of moderate to severe pain which is resulted in functional impairment.  The patient has tried various conservative therapeutic options such as NSAIDs, Tylenol , muscle relaxants, physical therapy which was inadequately effective.  Patient's pain is predominantly axial with physical exam and C-MRI with findings suggestive of facet arthropathy.  He has Simopoulos to go up cervical facet medial branch nerve blocks were discussed with the patient.  Risks and benefits were reviewed.  Patient would like to proceed with bilateral C4, C5, C6, medial branch nerve blocks   Orders:  Orders Placed This Encounter  Procedures   CERVICAL FACET (MEDIAL BRANCH NERVE BLOCK)     Standing Status:   Future    Expiration Date:   10/07/2024    Scheduling Instructions:     Procedure: Cervical facet Block     Type: Medial Branch Block     Side: Bilateral     Purpose: Diagnostic Radiologic Mapping     Level(s):C4-5, C5-6, C6-7,Fluoroscopic Pain Mapping Facet joints ( C4, C5, C6, Medial Branch Nerves)     Sedation: Patient's choice     Timeframe: As soon as schedule allows.    Where will this procedure be performed?:   [facility]    Return in about 3 weeks (around 07/28/2024) for B/L C4,  5, 6 MBNB , in clinic IV Versed  (Stop Plavix  7d, Eliquis  3d, take ASA 81 mg).    Recent Visits Date Type Provider Dept  06/15/24 Procedure visit Marcelino Nurse, MD [facility]  05/26/24 Office Visit Marcelino Nurse, MD [facility]  Showing recent visits within past 90 days and meeting all other requirements Today's Visits Date Type Provider Dept  07/07/24 Office Visit Marcelino Nurse, MD [facility]  Showing today's visits and meeting all other requirements Future Appointments No visits were found meeting these conditions. Showing future appointments within next 90 days and meeting all other requirements  I discussed the assessment and treatment plan with the patient. The patient was provided an opportunity to ask questions and all were answered. The patient agreed with the plan and demonstrated an understanding of the instructions.  Patient advised to call back or seek an in-person evaluation if the symptoms or condition worsens.  Duration of encounter: .  Total time on encounter, as per AMA guidelines included both the face-to-face and non-face-to-face time personally spent by the physician and/or other qualified health care professional(s) on the day of the encounter (includes time in activities that require the physician or other qualified health care professional and does not include time in activities normally performed by clinical staff). Physician's time may include the following activities when performed: Preparing to see the patient (e.g., pre-charting review  of records, searching for previously ordered imaging, lab work, and nerve conduction tests) Review of prior analgesic pharmacotherapies. Reviewing PMP Interpreting ordered tests (e.g., lab work, imaging, nerve conduction tests) Performing post-procedure evaluations, including interpretation of diagnostic procedures Obtaining and/or reviewing separately obtained history Performing a medically  appropriate examination and/or evaluation Counseling and educating the patient/family/caregiver Ordering medications, tests, or procedures Referring and communicating with other health care professionals (when not separately reported) Documenting clinical information in the electronic or other health record Independently interpreting results (not separately reported) and communicating results to the patient/ family/caregiver Care coordination (not separately reported)  Note by: Wallie Sherry, MD (TTS and AI technology used. I apologize for any typographical errors that were not detected and corrected.) Date: 07/07/2024; Time: 11:07 AM

## 2024-07-07 NOTE — Patient Instructions (Addendum)
 Get cardiac clearance to stop Plavix  7 days prior, Eliquis  3 days prior take 81 ASA instead during that time  Moderate Conscious Sedation, Adult Sedation is the use of medicines to help you relax and not feel pain. Moderate conscious sedation is a type of sedation that makes you less alert than normal. You are still able to respond to instructions, touch, or both. This type of sedation is used during short medical and dental procedures. It is milder than deep sedation, which is a type of sedation you cannot be easily woken up from. It is also milder than general anesthesia, which is the use of medicines to make you fall asleep. Moderate conscious sedation lets you return to your normal activities sooner. Tell a health care provider about: Any allergies you have. All medicines you are taking, including vitamins, herbs, steroids, eye drops, creams, and over-the-counter medicines. Any problems you or family members have had with anesthesia. Any bleeding problems you have. Any surgeries you have had. Any medical conditions you have. Whether you are pregnant or may be pregnant. Any recent alcohol, tobacco, or drug use. What are the risks? Your health care provider will talk with you about risks. These may include: Oversedation. This is when you get too much medicine. Nausea or vomiting. Allergic reaction to medicines. Trouble breathing. If this happens, a breathing tube may be used. It will be removed when you can breathe better on your own. Heart trouble. Lung trouble. Emergence delirium. This is when you feel confused while the sedation wears off. This gets better with time. What happens before the procedure? When to stop eating and drinking Follow instructions from your health care provider about what you may eat and drink. These may include: 8 hours before your procedure Stop eating most foods. Do not eat meat, fried foods, or fatty foods. Eat only light foods, such as toast or  crackers. All liquids are okay except energy drinks and alcohol. 6 hours before your procedure Stop eating. Drink only clear liquids, such as water , clear fruit juice, black coffee, plain tea, and sports drinks. Do not drink energy drinks or alcohol. 2 hours before your procedure Stop drinking all liquids. You may be allowed to take medicines with small sips of water . If you do not follow your health care provider's instructions, your procedure may be delayed or canceled. Medicines Ask your health care provider about: Changing or stopping your regular medicines. These include any diabetes medicines or blood thinners you take. Taking medicines such as aspirin  and ibuprofen. These medicines can thin your blood. Do not take them unless your health care provider tells you to. Taking over-the-counter medicines, vitamins, herbs, and supplements. Tests and exams You may have an exam or testing. You may have a blood or urine sample taken. General instructions Do not use any products that contain nicotine or tobacco for at least 4 weeks before the procedure. These products include cigarettes, chewing tobacco, and vaping devices, such as e-cigarettes. If you need help quitting, ask your health care provider. If you will be going home right after the procedure, plan to have a responsible adult: Take you home from the hospital or clinic. You will not be allowed to drive. Care for you for the time you are told. What happens during the procedure?  You will be given the sedative. It may be given: As a pill you can take by mouth. It can also be put into the rectum. As a spray through the nose. As an injection into  muscle. As an injection into a vein through an IV. You may be given oxygen as needed. Your blood pressure, heart rate, breathing rate, and blood oxygen level will be monitored during the procedure. The medical or dental procedure will be done. The procedure may vary among health care  providers and hospitals. What happens after the procedure? Your blood pressure, heart rate, breathing rate, and blood oxygen level will be monitored until you leave the hospital or clinic. You will get fluids through an IV as needed. Do not drive or operate machinery until your health care provider says that it is safe. This information is not intended to replace advice given to you by your health care provider. Make sure you discuss any questions you have with your health care provider. Document Revised: 06/23/2022 Document Reviewed: 06/23/2022 Elsevier Patient Education  2024 [employer]GENERAL RISKS AND COMPLICATIONS  What are the risk, side effects and possible complications? Generally speaking, most procedures are safe.  However, with any procedure there are risks, side effects, and the possibility of complications.  The risks and complications are dependent upon the sites that are lesioned, or the type of nerve block to be performed.  The closer the procedure is to the spine, the more serious the risks are.  Great care is taken when placing the radio frequency needles, block needles or lesioning probes, but sometimes complications can occur. Infection: Any time there is an injection through the skin, there is a risk of infection.  This is why sterile conditions are used for these blocks.  There are four possible types of infection. Localized skin infection. Central Nervous System Infection-This can be in the form of Meningitis, which can be deadly. Epidural Infections-This can be in the form of an epidural abscess, which can cause pressure inside of the spine, causing compression of the spinal cord with subsequent paralysis. This would require an emergency surgery to decompress, and there are no guarantees that the patient would recover from the paralysis. Discitis-This is an infection of the intervertebral discs.  It occurs in about 1% of discography procedures.  It is difficult to treat and  it may lead to surgery.        2. Pain: the needles have to go through skin and soft tissues, will cause soreness.       3. Damage to internal structures:  The nerves to be lesioned may be near blood vessels or    other nerves which can be potentially damaged.       4. Bleeding: Bleeding is more common if the patient is taking blood thinners such as  aspirin , Coumadin , Ticiid, Plavix , etc., or if he/she have some genetic predisposition  such as hemophilia. Bleeding into the spinal canal can cause compression of the spinal  cord with subsequent paralysis.  This would require an emergency surgery to  decompress and there are no guarantees that the patient would recover from the  paralysis.       5. Pneumothorax:  Puncturing of a lung is a possibility, every time a needle is introduced in  the area of the chest or upper back.  Pneumothorax refers to free air around the  collapsed lung(s), inside of the thoracic cavity (chest cavity).  Another two possible  complications related to a similar event would include: Hemothorax and Chylothorax.   These are variations of the Pneumothorax, where instead of air around the collapsed  lung(s), you may have blood or chyle, respectively.       6.  Spinal headaches: They may occur with any procedures in the area of the spine.       7. Persistent CSF (Cerebro-Spinal Fluid) leakage: This is a rare problem, but may occur  with prolonged intrathecal or epidural catheters either due to the formation of a fistulous  track or a dural tear.       8. Nerve damage: By working so close to the spinal cord, there is always a possibility of  nerve damage, which could be as serious as a permanent spinal cord injury with  paralysis.       9. Death:  Although rare, severe deadly allergic reactions known as Anaphylactic  reaction can occur to any of the medications used.      10. Worsening of the symptoms:  We can always make thing worse.  What are the chances of something like this  happening? Chances of any of this occuring are extremely low.  By statistics, you have more of a chance of getting killed in a motor vehicle accident: while driving to the hospital than any of the above occurring .  Nevertheless, you should be aware that they are possibilities.  In general, it is similar to taking a shower.  Everybody knows that you can slip, hit your head and get killed.  Does that mean that you should not shower again?  Nevertheless always keep in mind that statistics do not mean anything if you happen to be on the wrong side of them.  Even if a procedure has a 1 (one) in a 1,000,000 (million) chance of going wrong, it you happen to be that one..Also, keep in mind that by statistics, you have more of a chance of having something go wrong when taking medications.  Who should not have this procedure? If you are on a blood thinning medication (e.g. Coumadin , Plavix , see list of Blood Thinners), or if you have an active infection going on, you should not have the procedure.  If you are taking any blood thinners, please inform your physician.  How should I prepare for this procedure? Do not eat or drink anything at least six hours prior to the procedure. Bring a driver with you .  It cannot be a taxi. Come accompanied by an adult that can drive you back, and that is strong enough to help you if your legs get weak or numb from the local anesthetic. Take all of your medicines the morning of the procedure with just enough water  to swallow them. If you have diabetes, make sure that you are scheduled to have your procedure done first thing in the morning, whenever possible. If you have diabetes, take only half of your insulin  dose and notify our nurse that you have done so as soon as you arrive at the clinic. If you are diabetic, but only take blood sugar pills (oral hypoglycemic), then do not take them on the morning of your procedure.  You may take them after you have had the procedure. Do  not take aspirin  or any aspirin -containing medications, at least eleven (11) days prior to the procedure.  They may prolong bleeding. Wear loose fitting clothing that may be easy to take off and that you would not mind if it got stained with Betadine or blood. Do not wear any jewelry or perfume Remove any nail coloring.  It will interfere with some of our monitoring equipment.  NOTE: Remember that this is not meant to be interpreted as a complete list of all possible  complications.  Unforeseen problems may occur.  BLOOD THINNERS The following drugs contain aspirin  or other products, which can cause increased bleeding during surgery and should not be taken for 2 weeks prior to and 1 week after surgery.  If you should need take something for relief of minor pain, you may take acetaminophen  which is found in Tylenol ,m Datril, Anacin-3 and Panadol. It is not blood thinner. The products listed below are.  Do not take any of the products listed below in addition to any listed on your instruction sheet.  A.P.C or A.P.C with Codeine  Codeine  Phosphate Capsules #3 Ibuprofen Ridaura  ABC compound Congesprin Imuran rimadil  Advil Cope Indocin Robaxisal  Alka-Seltzer Effervescent Pain Reliever and Antacid Coricidin or Coricidin-D  Indomethacin Rufen  Alka-Seltzer plus Cold Medicine Cosprin Ketoprofen S-A-C Tablets  Anacin Analgesic Tablets or Capsules Coumadin  Korlgesic Salflex  Anacin Extra Strength Analgesic tablets or capsules CP-2 Tablets Lanoril Salicylate  Anaprox Cuprimine Capsules Levenox Salocol  Anexsia-D Dalteparin Magan Salsalate  Anodynos Darvon compound Magnesium  Salicylate Sine-off  Ansaid Dasin Capsules Magsal Sodium Salicylate  Anturane Depen Capsules Marnal Soma  APF Arthritis pain formula Dewitt's Pills Measurin  Stanback  Argesic Dia-Gesic Meclofenamic Sulfinpyrazone  Arthritis Bayer Timed Release Aspirin  Diclofenac Meclomen Sulindac  Arthritis pain formula Anacin Dicumarol Medipren  Supac  Analgesic (Safety coated) Arthralgen Diffunasal Mefanamic Suprofen  Arthritis Strength Bufferin Dihydrocodeine Mepro Compound Suprol  Arthropan liquid Dopirydamole Methcarbomol with Aspirin  Synalgos  ASA tablets/Enseals Disalcid Micrainin Tagament  Ascriptin Doan's Midol Talwin  Ascriptin A/D Dolene Mobidin Tanderil  Ascriptin Extra Strength Dolobid Moblgesic Ticlid  Ascriptin with Codeine  Doloprin or Doloprin with Codeine  Momentum Tolectin  Asperbuf Duoprin Mono-gesic Trendar  Aspergum Duradyne Motrin or Motrin IB Triminicin  Aspirin  plain, buffered or enteric coated Durasal Myochrisine Trigesic  Aspirin  Suppositories Easprin Nalfon Trillsate  Aspirin  with Codeine  Ecotrin Regular or Extra Strength Naprosyn Uracel  Atromid-S Efficin Naproxen Ursinus  Auranofin Capsules Elmiron Neocylate Vanquish  Axotal Emagrin Norgesic Verin  Azathioprine Empirin or Empirin with Codeine  Normiflo Vitamin E  Azolid Emprazil Nuprin Voltaren  Bayer Aspirin  plain, buffered or children's or timed BC Tablets or powders Encaprin Orgaran Warfarin Sodium   Buff-a-Comp Enoxaparin  Orudis Zorpin  Buff-a-Comp with Codeine  Equegesic Os-Cal-Gesic   Buffaprin Excedrin plain, buffered or Extra Strength Oxalid   Bufferin Arthritis Strength Feldene Oxphenbutazone   Bufferin plain or Extra Strength Feldene Capsules Oxycodone  with Aspirin    Bufferin with Codeine  Fenoprofen Fenoprofen Pabalate or Pabalate-SF   Buffets II Flogesic Panagesic   Buffinol plain or Extra Strength Florinal or Florinal with Codeine  Panwarfarin   Buf-Tabs Flurbiprofen Penicillamine   Butalbital Compound Four-way cold tablets Penicillin   Butazolidin Fragmin Pepto-Bismol   Carbenicillin Geminisyn Percodan   Carna Arthritis Reliever Geopen Persantine   Carprofen Gold's salt Persistin   Chloramphenicol Goody's Phenylbutazone   Chloromycetin Haltrain Piroxlcam   Clmetidine heparin  Plaquenil   Cllnoril Hyco-pap Ponstel   Clofibrate Hydroxy  chloroquine Propoxyphen         Before stopping any of these medications, be sure to consult the physician who ordered them.  Some, such as Coumadin  (Warfarin) are ordered to prevent or treat serious conditions such as deep thrombosis, pumonary embolisms, and other heart problems.  The amount of time that you may need off of the medication may also vary with the medication and the reason for which you were taking it.  If you are taking any of these medications, please make sure you notify your pain physician before you undergo any procedures.  Facet Blocks Patient Information  Description: The facets are joints in the spine between the vertebrae.  Like any joints in the body, facets can become irritated and painful.  Arthritis can also effect the facets.  By injecting steroids and local anesthetic in and around these joints, we can temporarily block the nerve supply to them.  Steroids act directly on irritated nerves and tissues to reduce selling and inflammation which often leads to decreased pain.  Facet blocks may be done anywhere along the spine from the neck to the low back depending upon the location of your pain.   After numbing the skin with local anesthetic (like Novocaine), a small needle is passed onto the facet joints under x-ray guidance.  You may experience a sensation of pressure while this is being done.  The entire block usually lasts about 15-25 minutes.   Conditions which may be treated by facet blocks:  Low back/buttock pain Neck/shoulder pain Certain types of headaches  Preparation for the injection:  Do not eat any solid food or dairy products within 8 hours of your appointment. You may drink clear liquid up to 3 hours before appointment.  Clear liquids include water , black coffee, juice or soda.  No milk or cream please. You may take your regular medication, including pain medications, with a sip of water  before your appointment.  Diabetics should hold  regular insulin  (if taken separately) and take 1/2 normal NPH dose the morning of the procedure.  Carry some sugar containing items with you to your appointment. A driver must accompany you and be prepared to drive you home after your procedure. Bring all your current medications with you. An IV may be inserted and sedation may be given at the discretion of the physician. A blood pressure cuff, EKG and other monitors will often be applied during the procedure.  Some patients may need to have extra oxygen administered for a short period. You will be asked to provide medical information, including your allergies and medications, prior to the procedure.  We must know immediately if you are taking blood thinners (like Coumadin /Warfarin) or if you are allergic to IV iodine contrast (dye).  We must know if you could possible be pregnant.  Possible side-effects:  Bleeding from needle site Infection (rare, may require surgery) Nerve injury (rare) Numbness & tingling (temporary) Difficulty urinating (rare, temporary) Spinal headache (a headache worse with upright posture) Light-headedness (temporary) Pain at injection site (serveral days) Decreased blood pressure (rare, temporary) Weakness in arm/leg (temporary) Pressure sensation in back/neck (temporary)   Call if you experience:  Fever/chills associated with headache or increased back/neck pain Headache worsened by an upright position New onset, weakness or numbness of an extremity below the injection site Hives or difficulty breathing (go to the emergency room) Inflammation or drainage at the injection site(s) Severe back/neck pain greater than usual New symptoms which are concerning to you  Please note:  Although the local anesthetic injected can often make your back or neck feel good for several hours after the injection, the pain will likely return. It takes 3-7 days for steroids to work.  You may not notice any pain relief for at  least one week.  If effective, we will often do a series of 2-3 injections spaced 3-6 weeks apart to maximally decrease your pain.  After the initial series, you may be a candidate for a more permanent nerve block of the facets.  If you have any questions, please call #[phone] [facility]

## 2024-07-08 ENCOUNTER — Encounter: Payer: Self-pay | Admitting: Neurosurgery

## 2024-07-08 ENCOUNTER — Ambulatory Visit: Admitting: Neurosurgery

## 2024-07-08 VITALS — BP 140/88 | Ht 74.0 in | Wt 215.0 lb

## 2024-07-08 DIAGNOSIS — H6980 Other specified disorders of Eustachian tube, unspecified ear: Secondary | ICD-10-CM | POA: Diagnosis not present

## 2024-07-08 DIAGNOSIS — M5412 Radiculopathy, cervical region: Secondary | ICD-10-CM | POA: Diagnosis not present

## 2024-07-08 DIAGNOSIS — M25511 Pain in right shoulder: Secondary | ICD-10-CM

## 2024-07-08 DIAGNOSIS — G8929 Other chronic pain: Secondary | ICD-10-CM | POA: Diagnosis not present

## 2024-07-08 DIAGNOSIS — H9042 Sensorineural hearing loss, unilateral, left ear, with unrestricted hearing on the contralateral side: Secondary | ICD-10-CM | POA: Diagnosis not present

## 2024-07-08 NOTE — Progress Notes (Signed)
   REFERRING PHYSICIAN:  [name], Pa-c [address] [place],  [name] [address]  DOS: 11/06/23, T7-12 Posterior fusion   HISTORY OF PRESENT ILLNESS:  07/08/2024 He has been having very severe right shoulder pain and has limited range of movement with his right shoulder.  He has an injection scheduled in his neck with Dr. Lateef in the next couple weeks.  Continues to have some shooting pains down his arm into his right hand.  04/26/2024 [name] is s/p above surgery.    He has been dealing with significant back, neck, and arm pain.  Is worse on the left than the right.  He additionally has been dealing with some syncope.  He would like to start rehab, but is felt lightheaded at times.  He has not had a true syncopal episode in 2 months.  He sees Dr. Nelida for medical management.  He is interested in injections if possible.    PHYSICAL EXAMINATION:  Vitals:   07/08/24 0916  BP: (!) 140/88       NEUROLOGICAL:   Awake, alert, oriented to person, place, and time.   He has pain with movement of his right arm.  The pain is around his right shoulder.  He has pain near his right shoulder and biceps when he flexes his biceps.  His limited range of motion is much more prominent than it was previously.  He has 4- out of 5 strength of the right biceps and 4- out of 5 right deltoid function.  Otherwise, he is 5 out of 5 throughout.  SILT   ROS (Neurologic):  Negative except as noted above   MRI C spine 02/26/2024 IMPRESSION: 1. 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 2. At C5-C6, mild to moderate canal and bilateral foraminal stenosis. 3. At C4-C5, mild canal and bilateral foraminal stenosis.     Electronically Signed   By: Gilmore GORMAN Molt M.D.   On: 02/27/2024 02:42  ASSESSMENT/PLAN:  MUSAB WINGARD is s/p above surgery.  He is doing reasonably well from thoracic spine intervention.  He does have some symptoms of  cervical radiculopathy as well as new symptoms concerning for right shoulder pathology.  I would like to send him for orthopedic evaluation.  He will continue with Dr. Lateef for injections.  I we will reconsider for surgical intervention after his orthopedic evaluation should that be deemed necessary.    I spent a total of 15 minutes in this patient's care today. This time was spent reviewing pertinent records including imaging studies, obtaining and confirming history, performing a directed evaluation, formulating and discussing my recommendations, and documenting the visit within the medical record.     Reeves Daisy MD Department of neurosurgery

## 2024-07-11 DIAGNOSIS — M25512 Pain in left shoulder: Secondary | ICD-10-CM | POA: Diagnosis not present

## 2024-07-11 DIAGNOSIS — F419 Anxiety disorder, unspecified: Secondary | ICD-10-CM | POA: Diagnosis not present

## 2024-07-11 DIAGNOSIS — M791 Myalgia, unspecified site: Secondary | ICD-10-CM | POA: Diagnosis not present

## 2024-07-11 DIAGNOSIS — G894 Chronic pain syndrome: Secondary | ICD-10-CM | POA: Diagnosis not present

## 2024-07-11 DIAGNOSIS — M5413 Radiculopathy, cervicothoracic region: Secondary | ICD-10-CM | POA: Diagnosis not present

## 2024-07-11 DIAGNOSIS — M5440 Lumbago with sciatica, unspecified side: Secondary | ICD-10-CM | POA: Diagnosis not present

## 2024-07-11 DIAGNOSIS — M5459 Other low back pain: Secondary | ICD-10-CM | POA: Diagnosis not present

## 2024-07-11 DIAGNOSIS — M4726 Other spondylosis with radiculopathy, lumbar region: Secondary | ICD-10-CM | POA: Diagnosis not present

## 2024-07-11 DIAGNOSIS — Z79891 Long term (current) use of opiate analgesic: Secondary | ICD-10-CM | POA: Diagnosis not present

## 2024-07-11 DIAGNOSIS — M329 Systemic lupus erythematosus, unspecified: Secondary | ICD-10-CM | POA: Diagnosis not present

## 2024-07-11 DIAGNOSIS — M961 Postlaminectomy syndrome, not elsewhere classified: Secondary | ICD-10-CM | POA: Diagnosis not present

## 2024-07-11 DIAGNOSIS — I25119 Atherosclerotic heart disease of native coronary artery with unspecified angina pectoris: Secondary | ICD-10-CM | POA: Diagnosis not present

## 2024-07-11 DIAGNOSIS — G8921 Chronic pain due to trauma: Secondary | ICD-10-CM | POA: Diagnosis not present

## 2024-07-11 NOTE — Telephone Encounter (Signed)
 Though not ideal given that Mr. 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  Aspirin  81 mg daily should be taken during the periprocedural period while Mr. Mcfarlane is off clopidogrel .  Given history of recurrent VTE, decisions regarding periprocedural anticoagulation with apixaban  (and need for bridging) is deferred to vascular surgery and hematology.  Lonni Hanson, MD [facility]

## 2024-07-12 ENCOUNTER — Ambulatory Visit: Admitting: Dermatology

## 2024-07-12 DIAGNOSIS — Z7189 Other specified counseling: Secondary | ICD-10-CM | POA: Diagnosis not present

## 2024-07-12 DIAGNOSIS — B079 Viral wart, unspecified: Secondary | ICD-10-CM | POA: Diagnosis not present

## 2024-07-12 NOTE — Patient Instructions (Addendum)
Instructions for After In-Office Application of Cantharidin  1. This is a strong medicine; please follow ALL instructions.  2. Gently wash off with soap and water in four hours or sooner s directed by your physician.  3. **WARNING** this medicine can cause severe blistering, blood blisters, infection, and/or scarring if it is not washed off as directed.  4. Your progress will be rechecked in 1-2 months; call sooner if there are any questions or problems.     Cryotherapy Aftercare  Wash gently with soap and water everyday.   Apply Vaseline and Band-Aid daily until healed.     Due to recent changes in healthcare laws, you may see results of your pathology and/or laboratory studies on MyChart before the doctors have had a chance to review them. We understand that in some cases there may be results that are confusing or concerning to you. Please understand that not all results are received at the same time and often the doctors may need to interpret multiple results in order to provide you with the best plan of care or course of treatment. Therefore, we ask that you please give [place] 2 business days to thoroughly review all your results before contacting the office for clarification. Should we see a critical lab result, you will be contacted sooner.   If You Need Anything After Your Visit  If you have any questions or concerns for your doctor, please call our main line at [phone] and press option 4 to reach your doctor's medical assistant. If no one answers, please leave a voicemail as directed and we will return your call as soon as possible. Messages left after 4 pm will be answered the following business day.   You may also send [place] a message via MyChart. We typically respond to MyChart messages within 1-2 business days.  For prescription refills, please ask your pharmacy to contact our office. Our fax number is [phone].  If you have an urgent issue when the clinic is closed that  cannot wait until the next business day, you can page your doctor at the number below.    Please note that while we do our best to be available for urgent issues outside of office hours, we are not available 24/7.   If you have an urgent issue and are unable to reach [place], you may choose to seek medical care at your doctor's office, retail clinic, urgent care center, or emergency room.  If you have a medical emergency, please immediately call 911 or go to the emergency department.  Pager Numbers  - Dr. Gwen Pounds: [phone]  - Dr. Roseanne Reno: [phone]  - Dr. Katrinka Blazing: [phone]   In the event of inclement weather, please call our main line at [phone] for an update on the status of any delays or closures.  Dermatology Medication Tips: Please keep the boxes that topical medications come in in order to help keep track of the instructions about where and how to use these. Pharmacies typically print the medication instructions only on the boxes and not directly on the medication tubes.   If your medication is too expensive, please contact our office at [phone] option 4 or send [place] a message through MyChart.   We are unable to tell what your co-pay for medications will be in advance as this is different depending on your insurance coverage. However, we may be able to find a substitute medication at lower cost or fill out paperwork to get insurance to cover a needed medication.  If a prior authorization is required to get your medication covered by your insurance company, please allow [place] 1-2 business days to complete this process.  Drug prices often vary depending on where the prescription is filled and some pharmacies may offer cheaper prices.  The website www.goodrx.com contains coupons for medications through different pharmacies. The prices here do not account for what the cost may be with help from insurance (it may be cheaper with your insurance), but the website can give you  the price if you did not use any insurance.  - You can print the associated coupon and take it with your prescription to the pharmacy.  - You may also stop by our office during regular business hours and pick up a GoodRx coupon card.  - If you need your prescription sent electronically to a different pharmacy, notify our office through [facility] or by phone at [phone] option 4.     Si Usted Necesita Algo Despus de Su Visita  Tambin puede enviarnos un mensaje a travs de Clinical cytogeneticist. Por lo general respondemos a los mensajes de MyChart en el transcurso de 1 a 2 das hbiles.  Para renovar recetas, por favor pida a su farmacia que se ponga en contacto con nuestra oficina. Annie Sable de fax es [place] [phone].  Si tiene un asunto urgente cuando la clnica est cerrada y que no puede esperar hasta el siguiente da hbil, puede llamar/localizar a su doctor(a) al nmero que aparece a continuacin.   Por favor, tenga en cuenta que aunque hacemos todo lo posible para estar disponibles para asuntos urgentes fuera del horario de [place], no estamos disponibles las 24 horas del da, los 7 [address]  [address] de la [place].   Si tiene un problema urgente y no puede comunicarse con nosotros, puede optar por buscar atencin mdica  en el consultorio de su doctor(a), en una clnica privada, en un centro de atencin urgente o en una sala de emergencias.  Si tiene Engineer, drilling, por favor llame inmediatamente al 911 o vaya a la sala de emergencias.  Nmeros de bper  - Dr. Gwen Pounds: [phone]  - Dra. Roseanne Reno: [phone]  - Dr. Katrinka Blazing: [phone]   En caso de inclemencias del tiempo, por favor llame a Lacy Duverney principal al [phone] para una actualizacin sobre el [place] de cualquier retraso o cierre.  Consejos para la medicacin en dermatologa: Por favor, guarde las cajas en las que vienen los medicamentos de uso tpico para ayudarle a seguir las instrucciones sobre dnde y  cmo usarlos. Las farmacias generalmente imprimen las instrucciones del medicamento slo en las cajas y no directamente en los tubos del [place].   Si su medicamento es muy caro, por favor, pngase en contacto con Rolm Gala llamando al [phone] y presione la opcin 4 o envenos un mensaje a travs de Clinical cytogeneticist.   No podemos decirle cul ser su copago por los medicamentos por adelantado ya que esto es diferente dependiendo de la cobertura de su seguro. Sin embargo, es posible que podamos encontrar un medicamento sustituto a Audiological scientist un formulario para que el seguro cubra el medicamento que se considera necesario.   Si se requiere una autorizacin previa para que su compaa de seguros [place] su medicamento, por favor permtanos de 1 a 2 das hbiles para completar [address]  Los precios de los medicamentos varan con frecuencia dependiendo del Environmental consultant de dnde se surte la receta y alguna farmacias pueden ofrecer precios ms baratos.  El sitio web  www.goodrx.com tiene cupones para medicamentos de Health and safety inspector. Los precios aqu no tienen en cuenta lo que podra costar con la ayuda del seguro (puede ser ms barato con su seguro), pero el sitio web puede darle el precio si no utiliz Tourist information centre manager.  - Puede imprimir el cupn correspondiente y llevarlo con su receta a la farmacia.  - Tambin puede pasar por nuestra oficina durante el horario de atencin regular y Education officer, museum una tarjeta de cupones de GoodRx.  - Si necesita que su receta se enve electrnicamente a una farmacia diferente, informe a nuestra oficina a travs de MyChart de [place] o por telfono llamando al [phone] y presione la opcin 4.

## 2024-07-12 NOTE — Progress Notes (Unsigned)
   Follow-Up Visit   Subjective  Jonathon Snow is a 63 y.o. male who presents for the following: Wart 4wk f/u, R index finger x 2, R palm x 1, 1 treatment 36m ago, wart has been there a long time  The following portions of the chart were reviewed this encounter and updated as appropriate: medications, allergies, medical history  Review of Systems:  No other skin or systemic complaints except as noted in HPI or Assessment and Plan.  Objective  Well appearing patient in no apparent distress; mood and affect are within normal limits.  A focused examination was performed of the following areas: Right hand  Relevant exam findings are noted in the Assessment and Plan.  R index finger x 2, R palm x 1 (3) Verrucous papules -- Discussed viral etiology and contagion.   Assessment & Plan   VIRAL WARTS, UNSPECIFIED TYPE (3) R index finger x 2, R palm x 1 (3) Viral Wart (HPV) Counseling  Discussed viral / HPV (Human Papilloma Virus) etiology and risk of spread /infectivity to other areas of body as well as to other people.  Multiple treatments and methods may be required to clear warts and it is possible treatment may not be successful.  Treatment risks include discoloration; scarring and there is still potential for wart recurrence. Destruction of lesion - R index finger x 2, R palm x 1 (3) Complexity: simple   Destruction method: cryotherapy   Informed consent: discussed and consent obtained   Timeout:  patient name, date of birth, surgical site, and procedure verified Lesion destroyed using liquid nitrogen: Yes   Region frozen until ice ball extended beyond lesion: Yes   Outcome: patient tolerated procedure well with no complications   Post-procedure details: wound care instructions given    Destruction of lesion - R index finger x 2, R palm x 1 (3)  Destruction method: chemical removal   Informed consent: discussed and consent obtained   Timeout:  patient name, date of birth, surgical  site, and procedure verified Chemical destruction method comment:  Squaric acid 3%, Cantharidin plus Application time:  4 hours Procedure instructions: patient instructed to wash and dry area   Outcome: patient tolerated procedure well with no complications   Post-procedure details: wound care instructions given   Additional details:  Patient advised to set alarm to remind them to wash off with soap and water  at the directed time.   Return in about 2 months (around 09/12/2024) for wart f/u.  I, Grayce Saunas, RMA, am acting as scribe for Alm Rhyme, MD .   Documentation: I have reviewed the above documentation for accuracy and completeness, and I agree with the above.  Alm Rhyme, MD

## 2024-07-12 NOTE — Telephone Encounter (Signed)
 Will send to preop APP to review notes from Dr. Mady as well if the pt needs an appt?

## 2024-07-13 ENCOUNTER — Encounter: Payer: Self-pay | Admitting: Dermatology

## 2024-07-13 DIAGNOSIS — R569 Unspecified convulsions: Secondary | ICD-10-CM | POA: Diagnosis not present

## 2024-07-13 NOTE — Telephone Encounter (Signed)
 Voicemail left by Merlynn.

## 2024-07-13 NOTE — Telephone Encounter (Signed)
 He wants to know if there is anything else that you can do to help him with the pain, since he can't get the procedure

## 2024-07-14 ENCOUNTER — Encounter: Payer: Self-pay | Admitting: Family Medicine

## 2024-07-14 ENCOUNTER — Ambulatory Visit

## 2024-07-14 NOTE — Therapy (Signed)
 OUTPATIENT PHYSICAL THERAPY TREATMENT   Patient Name: Jonathon Snow MRN: [id number] DOB:1961/05/17, 63 y.o., male Today's Date: 07/14/2024  END OF SESSION:  PT End of Session - 07/14/24 0953     Visit Number 14    Number of Visits 33    Date for PT Re-Evaluation 08/26/24    Authorization Type HB auth#[id number] for 4 PT vsts from 7/9-8/7    Authorization - Visit Number 1    Authorization - Number of Visits 4    PT Start Time [phone]    PT Stop Time 1005    PT Time Calculation (min) 12 min    Activity Tolerance Patient tolerated treatment well;Patient limited by pain    Behavior During Therapy [facility] for tasks assessed/performed                        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   Overview:  superior sagittal sinus, left transverse sinus and cortical veins    Closed left hip fracture, initial encounter (HCC) 02/18/2023   COVID-19 virus infection 12/2020   Depression    DVT (deep venous thrombosis) (HCC)    Dyspnea    GERD (gastroesophageal reflux disease)    Head injury 11/05/2023   Heart attack (HCC) 11/05/2023   Heel spur, left 02/16/2019   Heel spur, right 02/16/2019   Hematoma of groin 11/08/2023   Herpes zoster infection of lumbar region 02/20/2020   History of DVT (deep vein thrombosis) 03/22/2020   History of kidney stones    Hyperlipidemia    Hypertension    Long term current use of systemic steroids 11/08/2020   Lupus    Lymphedema 10/07/2018   Morbid obesity (HCC)    Opiate abuse, episodic (HCC) 02/26/2018   Osteoporosis    Pain and swelling of right lower extremity 02/03/2023   Pneumonia    PONV (postoperative nausea and vomiting)    Postphlebitic syndrome with ulcer, 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   Presence of  IVC filter 03/22/2020   Removed   Pulmonary embolism (HCC)    Renal disorder    Stage III   Severe episode of recurrent major depressive disorder, 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   Shock circulatory (HCC) 11/06/2023   STEMI (ST elevation myocardial infarction) (HCC) 11/05/2023   Past Surgical History:  Procedure Laterality Date   ANKLE SURGERY Right    BACK SURGERY     BRONCHIAL WASHINGS N/A 11/01/2021   Procedure: BRONCHIAL WASHINGS;  Surgeon: Parris Manna, MD;  Location: [facility] ORS;  Service: Thoracic;  Laterality: N/A;   COLONOSCOPY WITH PROPOFOL  N/A 05/28/2020   Procedure: COLONOSCOPY WITH PROPOFOL ;  Surgeon: Therisa Bi, MD;  Location: [facility] ENDOSCOPY;  Service: Endoscopy;  Laterality: N/A;   COLONOSCOPY WITH PROPOFOL  N/A 07/01/2023   Procedure: COLONOSCOPY WITH PROPOFOL ;  Surgeon: Therisa Bi, MD;  Location: [facility] ENDOSCOPY;  Service: Gastroenterology;  Laterality: N/A;   CORONARY ULTRASOUND/IVUS N/A 11/05/2023   Procedure: Coronary Ultrasound/IVUS;  Surgeon: Mady Bruckner, MD;  Location: [facility] INVASIVE CV LAB;  Service: Cardiovascular;  Laterality: N/A;   CORONARY/GRAFT ACUTE MI REVASCULARIZATION N/A 11/05/2023   Procedure: Coronary/Graft Acute MI Revascularization;  Surgeon: Mady Bruckner, MD;  Location: [facility] INVASIVE CV LAB;  Service: Cardiovascular;  Laterality: N/A;   CYST EXCISION  92  or 93    Liver cyst removal [facility]   FLEXIBLE BRONCHOSCOPY N/A 11/01/2021   Procedure: FLEXIBLE BRONCHOSCOPY;  Surgeon: Parris Manna, MD;  Location: [facility] ORS;  Service: Thoracic;  Laterality: N/A;   HIP PINNING,CANNULATED Left 02/19/2023   Procedure: PERCUTANEOUS FIXATION OF FEMORAL NECK;  Surgeon: Tobie Priest, MD;  Location: [facility] ORS;  Service: Orthopedics;  Laterality: Left;   I & D EXTREMITY Right 04/29/2017   Procedure: IRRIGATION AND DEBRIDEMENT EXTREMITY;  Surgeon: Shelva Dunnings, MD;  Location: [facility] ORS;  Service: General;  Laterality: Right;   IRRIGATION AND  DEBRIDEMENT ABSCESS Left 04/29/2017   Procedure: IRRIGATION AND DEBRIDEMENT Scrotal ABSCESS;  Surgeon: Shelva Dunnings, MD;  Location: [facility] ORS;  Service: General;  Laterality: Left;   LEFT HEART CATH AND CORONARY ANGIOGRAPHY N/A 11/05/2023   Procedure: LEFT HEART CATH AND CORONARY ANGIOGRAPHY;  Surgeon: Mady Bruckner, MD;  Location: [facility] INVASIVE CV LAB;  Service: Cardiovascular;  Laterality: N/A;   POLYPECTOMY  07/01/2023   Procedure: POLYPECTOMY;  Surgeon: Therisa Bi, MD;  Location: [facility] ENDOSCOPY;  Service: Gastroenterology;;   Patient Active Problem List   Diagnosis Date Noted   Cervical radicular pain 05/26/2024   History of thoracic spinal fusion (T7-T12) 05/26/2024   History of lumbar spinal fusion (L5-S1) 05/26/2024   Chronic pain syndrome 05/26/2024   Anxiety and depression 02/26/2024   Dyslipidemia 02/26/2024   Peripheral neuropathy 02/26/2024   Essential hypertension 02/26/2024   Vertigo 02/26/2024   Syncope 02/25/2024   Orthostatic hypotension 02/11/2024   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) 01/20/2024   Anxiety 12/14/2023   Overweight (BMI 25.0-29.9) 11/23/2023   Chronic heart failure with preserved ejection fraction (HFpEF) (HCC) 11/23/2023   BPH (benign prostatic hyperplasia) 11/23/2023   Unstable angina (HCC) 11/10/2023   Closed T10 spinal fracture (HCC) 11/06/2023   Closed tricolumnar fracture of thoracic vertebra (HCC) 11/06/2023   Thoracic spine instability 11/06/2023   Chronic bilateral low back pain with left-sided sciatica 07/07/2023   Neuropathy 07/07/2023   Hx of colonic polyps 07/01/2023   Adenomatous polyp of colon 07/01/2023   Erectile disorder 01/22/2023   Coronary artery disease involving native coronary artery of native heart without angina pectoris 09/09/2022   Varicose 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 01/03/2022   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) 09/18/2021   Prediabetes  11/08/2020   Stage 3a chronic kidney disease (HCC) 11/08/2020   Seasonal allergies 11/08/2020   Rhinosinusitis 11/08/2020   Anticoagulant disorder (HCC) 11/07/2020   Senile purpura (HCC) 11/07/2020   MDD (major depressive disorder), recurrent episode, mild (HCC) 09/12/2019   Other spondylosis with radiculopathy, lumbar region 02/16/2019   Osteoporosis 10/12/2018   Chronic venous insufficiency 10/07/2018   Lymphedema 10/07/2018   SLE glomerulonephritis syndrome, 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HCC) 03/03/2018   Syncope and collapse 12/29/2017   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) 10/29/2016   Hyperlipidemia LDL goal <70 01/15/2016   Uncontrolled hypertension 01/15/2016   Obesity (BMI 30.0-34.9) 01/15/2016   Long term current use of anticoagulant 10/04/2015   Systemic lupus erythematosus (HCC) 05/30/2015   COPD, moderate (HCC) 06/27/2014   Shortness of breath 06/27/2014   History of pulmonary embolism 12/11/2013   Nodule of right lung 12/11/2013   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   Cystic disease of liver 08/21/2013    PCP: [name], PA-C REFERRING PROVIDER: Clois Fret, MD REFERRING DIAG: [phone] (ICD-10-CM) - Cervical radiculopathy M54.50,G89.29 (ICD-10-CM) - Chronic bilateral  low back pain without sciatica  Rationale for Evaluation and Treatment: Rehabilitation  THERAPY DIAG:  Cervicalgia  Radiculopathy, cervical region  ONSET DATE: November 2024  SUBJECTIVE:                                                                                                                                                                                           SUBJECTIVE STATEMENT: Pt felt like he was going to pass out earlier but recovered.       PERTINENT HISTORY:  Cervicalgia, low back pain. Pt also reports R > L C5/C6 dermatome to elbow bilaterally. Pt has a difficult time turning his head and looking up. No neck  surgery. Had Thoracic and lumbar spine fusion surgery. Had mid back surgery (thoracic) November 2024 due to his fall. Neck pain is more recent. Neck and shoulder pain began November 2024 after his thoracic surgery. Usually sleeps on his L side but is currently difficulty to do secondary to B shoulder and neck pain, so pt is not sleeping well. Neck pain is the same since November 2024. Pt tends to have issues getting dizzy and passes out when he is standing and exerting himself  Vision gets blurry and splotchy. Room feels like it is waving around, pt is not walking on a stable surface, feels like he is walking on a boat and not having sea legs.   The IVC filter was removed.  Still has L LE DVT. Pt states taking Elequis. L LE DVT is still being treated.    PAIN:  Are you having pain? 5/10 pain in bilat UT area of neck and shoulders Rt> Lt.   PRECAUTIONS:  -dizzy and passes out when he is standing and exerting himself; chronic orthostatic hypotension; Lt LE DVT, fall risk  RED FLAGS: Bowel or bladder incontinence: No and Cauda equina syndrome: No   WEIGHT BEARING RESTRICTIONS: No  FALLS:  Has patient fallen in last 6 months? Yes. Number of falls 2 (original fall was when pt had a heart attack November 2024 and hurt his back. February 22, 2024, pt passed out and fell into the bath tub.   LIVING ENVIRONMENT: Lives with: lives alone Lives in: House/apartment Stairs: No Has following equipment at home: Single point cane and Environmental consultant - 2 wheeled  OCCUPATION: Not currently working  PLOF: Needs assistance with homemaking   PATIENT GOALS: be able to do everything better.   NEXT MD VISIT: Yes but does not know when.   OBJECTIVE:  Note: Objective measures were completed at Evaluation unless otherwise noted.  DIAGNOSTIC FINDINGS:  MR CERVICAL SPINE WO CONTRAST 02/26/2024  Narrative & Impression  CLINICAL DATA:  Myelopathy, acute, cervical spine   EXAM: MRI CERVICAL SPINE WITHOUT CONTRAST    TECHNIQUE: Multiplanar, multisequence MR imaging of the cervical spine was performed. No intravenous contrast was administered.   COMPARISON:  CT of the cervical spine November 05, 2023.   FINDINGS: Alignment: No substantial sagittal subluxation.   Vertebrae: No evidence of acute fracture, evidence of discitis, or suspicious bone lesion. Benign vertebral venous malformation at T3. Mild T1-T2 height loss, unchanged.   Cord: Normal cord signal.  Neck   Posterior Fossa, vertebral arteries, paraspinal tissues: Visualized vertebral artery flow voids are maintained. No paraspinal edema.   Disc levels:   C2-C3: No significant disc protrusion, foraminal stenosis, or canal stenosis.   C3-C4: No significant disc protrusion, foraminal stenosis, or canal stenosis.   C4-C5: Right eccentric posterior disc osteophyte complex with right greater than left facet and uncovertebral hypertrophy. Resulting mild right greater than left foraminal stenosis. Mild canal stenosis.   C5-C6: Posterior disc osteophyte complex ligamentum flavum thickening and right greater than left facet and uncovertebral hypertrophy. Resulting mild-to-moderate bilateral foraminal stenosis. Mild to moderate canal stenosis.   C6-C7: Posterior disc osteophyte complex with bilateral facet and uncovertebral hypertrophy. Resulting moderate bilateral foraminal stenosis. Mild canal stenosis.   C7-T1: No significant disc protrusion, foraminal stenosis, or canal stenosis.   IMPRESSION: 1. 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 2. At C5-C6, mild to moderate canal and bilateral foraminal stenosis. 3. At C4-C5, mild canal and bilateral foraminal stenosis.     Electronically Signed   By: Gilmore GORMAN Molt M.D.   On: 02/27/2024 02:42         PATIENT SURVEYS:  Neck Disability Index score: 41/50 (82%) (05/02/2024)  COGNITION: Overall cognitive status: Within functional limits for  tasks assessed     SENSATION:   MUSCLE LENGTH:   POSTURE:   PALPATION: TTP to posterior neck   CERVICAL ROM:   Active ROM A/PROM (deg) eval  Flexion WFL with posterior neck pain  Extension Very limited with pain, movement preference around C5/C6 area  Right lateral flexion Very limited with pain  Left lateral flexion Very limited with pain  Right rotation 20 degrees with pain  Left rotation 22 degrees with pain   (Blank rows = not tested)  UPPER EXTREMITY MMT:  MMT Right eval Left eval  Shoulder flexion 3+ with neck and shoulder and arm pain 4- with pain at available range.   Shoulder extension    Shoulder abduction    Shoulder adduction    Shoulder extension    Shoulder internal rotation    Shoulder external rotation    Middle trapezius    Lower trapezius (seated manually resisted) 3+ with neck pain 3+ with neck pain  Elbow flexion    Elbow extension    Wrist flexion    Wrist extension    Wrist ulnar deviation    Wrist radial deviation    Wrist pronation    Wrist supination    Grip strength     (Blank rows = not tested)  UPPER EXTREMITY ROM:  Active ROM Right eval Left eval  Shoulder flexion 55 (scaption) 55 (scaption)  Shoulder extension    Shoulder abduction    Shoulder adduction    Shoulder extension    Shoulder internal rotation    Shoulder external rotation    Elbow flexion    Elbow extension    Wrist flexion    Wrist extension    Wrist ulnar deviation  Wrist radial deviation    Wrist pronation    Wrist supination     (Blank rows = not tested)    LUMBAR ROM:   AROM eval  Flexion   Extension   Right lateral flexion   Left lateral flexion   Right rotation   Left rotation    (Blank rows = not tested)  LOWER EXTREMITY ROM:     Passive  Right eval Left eval  Hip flexion    Hip extension    Hip abduction    Hip adduction    Hip internal rotation    Hip external rotation    Knee flexion    Knee extension    Ankle  dorsiflexion    Ankle plantarflexion    Ankle inversion    Ankle eversion     (Blank rows = not tested)  LOWER EXTREMITY MMT:    MMT Right eval Left eval  Hip flexion    Hip extension    Hip abduction    Hip adduction    Hip internal rotation    Hip external rotation    Knee flexion    Knee extension    Ankle dorsiflexion    Ankle plantarflexion    Ankle inversion    Ankle eversion     (Blank rows = not tested)  GAIT: Distance walked: 30 ft Assistive device utilized: None Level of assistance: SBA Comments: antalgic, decreased stance R LE, forward flexed, decreased gait speed  TREATMENT DATE: 07/14/2024     Neuromuscular re education   At start of session: Blood pressure L arm sitting, mechanically taken, normal cuff: 95/68, HR 69 SpO2 100 %, room air  Right afterwards: Blood pressure L arm sitting, mechanically taken, normal cuff: 107/62, HR 67 SpO2 100 %, room air  HR went up to 133 BPM at rest then went down to 66 at rest.    Blood pressure L arm sitting, mechanically taken, normal cuff: 86/67, HR 69 SpO2 100 %, room air   Physical therapy session cancelled today secondary to low blood pressure levels and irregular heart rate. Pt was recommended to contact his MD. Pt verbalized understanding. Pt also declined calling the ambulance and said he can make it home.      PATIENT EDUCATION:  Education details: POC Person educated: Patient Education method: Explanation Education comprehension: verbalized understanding  HOME EXERCISE PROGRAM: Pt was recommended to press his tongue at the roof of his mouth to activate his anterior cervical muscles throughout the day. Pt demonstrated and verbalized understanding.    Access Code: [email or web address] URL: [email or web address] Date: 05/10/2024 Prepared by: Emil Glassman  Exercises - Seated Cervical Flexion AROM  - 2 x daily - 7 x weekly - 3 sets - 5 reps - Seated Transversus Abdominis Bracing  - 1 x  daily - 7 x weekly - 2 -3 sets - 10 reps - 5 seconds hold - Seated Chin Tuck with Neck Elongation  - 2-3 x daily - 7 x weekly - 2-3 sets - 5 reps - Seated Cervical Sidebending AROM  - 3 x daily - 7 x weekly - 4 sets - 5 reps  ASSESSMENT:  CLINICAL IMPRESSION: Physical therapy session cancelled today secondary to low blood pressure levels and irregular heart rate. Pt was recommended to contact his MD. Pt verbalized understanding. Pt also declined calling the ambulance and said he can make it home.     OBJECTIVE IMPAIRMENTS: Abnormal gait, decreased balance, decreased endurance, difficulty walking, decreased ROM, decreased strength, dizziness,  impaired UE functional use, improper body mechanics, postural dysfunction, and pain.   ACTIVITY LIMITATIONS: carrying, lifting, bending, sitting, standing, squatting, sleeping, stairs, transfers, bed mobility, bathing, toileting, dressing, reach over head, hygiene/grooming, locomotion level, and caring for others  PARTICIPATION LIMITATIONS:   PERSONAL FACTORS: Age, Fitness, Past/current experiences, Time since onset of injury/illness/exacerbation, and 3+ comorbidities: DVT, depression, dyspnea, heart attack, HTN, osteoporosis, are also affecting patient's functional outcome.   REHAB POTENTIAL: Fair    CLINICAL DECISION MAKING: Stable/uncomplicated  EVALUATION COMPLEXITY: Low   GOALS: Goals reviewed with patient? Yes  SHORT TERM GOALS: Target date: 05/13/2024  Pt will be independent with his initial HEP to improve strength, decrease pain, improve ability to lift, look around as well as ambulate and perform standing tasks with less difficulty and pain. Baseline: Pt has not yet officially started his initial HEP (05/02/2024) Goal status: ACHIEVED   LONG TERM GOALS: Target date: 07/01/2024  Pt will have a decrease in neck pain to 5/10 or less at worst to promote ability to look around more comfortably.  Baseline: 9/10 neck pain at worst for the  past 3 months (05/02/2024); 6/10 at most for 7 days (06/02/2024); 06/14/24: 7-8/10 ; 6/10 at most for the past 7 days (06/27/2024) Goal status: Progressing  2.  Pt will have a decrease on low back pain to 4/10 or less at worst to promote ability to tolerate sitting, standing, walking and performing standing tasks more comfortably for his back.  Baseline: 8/10 low back pain at worst for past 7 days; 6/10 worst pain in past week 06/14/24; 7/10 at most for the past 7 days (06/27/2024) Goal status: PROGRESSING  3.  Pt will improve his Neck Disability Index (NDI) score by at least 20% as a demonstration of improved function.  Baseline: Neck Disability Index score: 41/50 (82%) (05/02/2024); 06/14/24: 58% NDI Goal status: ACHIEVED   4.  Pt will improve his cervical rotation AROM to 50 degrees or more to promote ability to look around more comfortably. Baseline:  Active ROM A/PROM (deg) eval AROM (06/02/2024)  Right rotation 20 degrees with pain 30 degrees with pain  Left rotation 22 degrees with pain 35 degrees with pain   (05/02/2024) Goal status: PROGRESSING  5.  Pt will improve B shoulder flexion AROM to 100 degrees or more to promote ability to reach as well as lift more comfortably.  Baseline:  Active ROM Right eval Left eval R (06/02/2024) L (06/02/2024) R (06/27/2024) L  (06/27/2024)  Shoulder flexion 55 (scaption) 55 (scaption) 44  (Scaption) 65  (Scaption) 56 degrees with R upper trap and lateral shoulder and arm pain to elbow.  83 degrees with anterior shoulder and arm pain   (05/02/2024)  Goal status: ONGOING  6.  Pt will improve his shoulder flexion and lower trap strength by at least 1/2 MMT grade to promote ability to raise his arms as well as lift with less difficulty.  Baseline:  MMT Right eval Left eval R (06/27/2024) L (06/27/2024)  Shoulder flexion 3+ with neck and shoulder and arm pain 4- with pain at available range.  4-/5 manual resistance at available range with R UE pain to  hand 4/5 manual resistance at available range  Lower trapezius (seated manually resisted) 3+ with neck pain 3+ with neck pain 3+ 4   (05/02/2024) Goal status: IN PROGRESS   PLAN:  PT FREQUENCY: 1-2x/week  PT DURATION: 8 weeks  PLANNED INTERVENTIONS: 97110-Therapeutic exercises, 97530- Therapeutic activity, [id number]- Neuromuscular re-education, 97535- Self Care, [id number]- Manual  therapy, [id number]- Gait training, [phone]- Electrical stimulation (unattended), [phone]- Ionotophoresis 4mg /ml Dexamethasone , Patient/Family education, and Balance training.  PLAN FOR NEXT SESSION: posture, B scapular, anterior cervical, trunk and glute strengthening, mechanics, manual techniques, modalities PRN   Avanti Jetter, PT, DPT 07/14/2024, 10:09 AM

## 2024-07-15 DIAGNOSIS — M12812 Other specific arthropathies, not elsewhere classified, left shoulder: Secondary | ICD-10-CM | POA: Diagnosis not present

## 2024-07-15 DIAGNOSIS — G8929 Other chronic pain: Secondary | ICD-10-CM | POA: Diagnosis not present

## 2024-07-15 DIAGNOSIS — M12811 Other specific arthropathies, not elsewhere classified, right shoulder: Secondary | ICD-10-CM | POA: Diagnosis not present

## 2024-07-18 DIAGNOSIS — M3214 Glomerular disease in systemic lupus erythematosus: Secondary | ICD-10-CM | POA: Diagnosis not present

## 2024-07-18 DIAGNOSIS — R809 Proteinuria, unspecified: Secondary | ICD-10-CM | POA: Diagnosis not present

## 2024-07-18 DIAGNOSIS — I10 Essential (primary) hypertension: Secondary | ICD-10-CM | POA: Diagnosis not present

## 2024-07-18 DIAGNOSIS — N1831 Chronic kidney disease, stage 3a: Secondary | ICD-10-CM | POA: Diagnosis not present

## 2024-07-19 ENCOUNTER — Telehealth: Payer: Self-pay | Admitting: Internal Medicine

## 2024-07-19 ENCOUNTER — Ambulatory Visit
Attending: Student in an Organized Health Care Education/Training Program | Admitting: Student in an Organized Health Care Education/Training Program

## 2024-07-19 ENCOUNTER — Encounter: Payer: Self-pay | Admitting: Student in an Organized Health Care Education/Training Program

## 2024-07-19 VITALS — BP 136/98 | HR 96 | Temp 97.6°F | Resp 16 | Ht 74.0 in | Wt 215.0 lb

## 2024-07-19 DIAGNOSIS — Z981 Arthrodesis status: Secondary | ICD-10-CM | POA: Insufficient documentation

## 2024-07-19 DIAGNOSIS — M47812 Spondylosis without myelopathy or radiculopathy, cervical region: Secondary | ICD-10-CM | POA: Diagnosis not present

## 2024-07-19 DIAGNOSIS — M5412 Radiculopathy, cervical region: Secondary | ICD-10-CM | POA: Diagnosis not present

## 2024-07-19 DIAGNOSIS — F332 Major depressive disorder, recurrent severe without psychotic features: Secondary | ICD-10-CM | POA: Diagnosis not present

## 2024-07-19 DIAGNOSIS — G894 Chronic pain syndrome: Secondary | ICD-10-CM | POA: Diagnosis not present

## 2024-07-19 MED ORDER — HYDROCODONE-ACETAMINOPHEN 5-325 MG PO TABS: 1.0000 | ORAL_TABLET | Freq: Two times a day (BID) | ORAL | 0 refills | Status: DC | PRN

## 2024-07-19 MED ORDER — GABAPENTIN 300 MG PO CAPS: 300.0000 mg | ORAL_CAPSULE | Freq: Two times a day (BID) | ORAL | 2 refills | Status: DC

## 2024-07-19 NOTE — Telephone Encounter (Signed)
 Patient states he has already talked to someone.

## 2024-07-19 NOTE — Progress Notes (Signed)
 PROVIDER NOTE: Interpretation of information contained herein should be left to medically-trained personnel. Specific patient instructions are provided elsewhere under Patient Instructions section of medical record. This document was created in part using AI and STT-dictation technology, any transcriptional errors that may result from this process are unintentional.  Patient: Jonathon Snow  Service: E/M   PCP: [name], PA-C  DOB: February 03, 1961  DOS: 07/19/2024  Provider: Wallie Sherry, MD  MRN: [id number]  Delivery: Face-to-face  Specialty: Interventional Pain Management  Type: Established Patient  Setting: Ambulatory outpatient facility  Specialty designation: 09  Referring Prov.: [name], PA-C  Location: Outpatient office facility       History of present illness (HPI) [name], a 63 y.o. year old male, is here today because of his Cervical facet joint syndrome [M47.812]. Mr. [name] primary complain today is Neck Pain and Back Pain (Lower radiates down left leg to foot effect toes)  Pertinent problems: Mr. [name] does not have any pertinent problems on file.  Pain Assessment: Severity of Chronic pain is reported as a 6 /10. Location: Neck Right, Left/right side is worse, radiates to shoulders bilateral down arms to fanger, effects finger, sometimes pain will go to the elbow and stop. Onset: More than a month ago. Quality: Aching, Burning, Restless, Discomfort, Tiring, Shooting, Stabbing. Timing: Constant. Modifying factor(s): denies, rest. Vitals:  height is 6' 2 (1.88 m) and weight is 215 lb (97.5 kg). His temperature is 97.6 F (36.4 C). His blood pressure is 136/98 (abnormal) and his pulse is 96. His respiration is 16 and oxygen saturation is 100%.  BMI: Estimated body mass index is 27.6 kg/m as calculated from the following:   Height as of this encounter: 6' 2 (1.88 m).   Weight as of this encounter: 215 lb (97.5 kg).  Last encounter: 07/07/2024. Last procedure:  06/15/2024.  Reason for encounter: Unfortunately, patient is not able to come off of his Eliquis  or Plavix  for his diagnostic cervical facet medial branch nerve blocks given his cardiac history and peripheral vascular disease history.  I encouraged him to reach out to his cardiac and vascular team to see when he could safely and reasonably stop those for a short period of time.  I would need the patient to be off of his Plavix  for 7 days and Eliquis  for 3 days for the diagnostic cervical facet medial branch nerve block.  Until that can happen we will focus on medication management.  He has tried tramadol  in the past which he states was not helpful.  He has also tried hydrocodone  and oxycodone  and states that he did receive some relief with hydrocodone .  This is not intended to be long-term but up until we can proceed with his cervical intervention.  I also recommend that he restart gabapentin  at a higher dose to help out with his neuropathic pain.  We will obtain baseline urine toxicology screen today, he will also sign pain contract.    Pharmacotherapy Assessment   Hydrocodone  5 mg 1 to 2 tablets twice daily as needed Monitoring: [place] PMP: PDMP not reviewed this encounter.       Pharmacotherapy: No side-effects or adverse reactions reported. Compliance: No problems identified. Effectiveness: Clinically acceptable.  Dorlene Montie FALCON, RN  07/19/2024 12:08 PM  Sign when Signing Visit Safety precautions to be maintained throughout the outpatient stay will include: orient to surroundings, keep bed in low position, maintain call bell within reach at all times, provide assistance with transfer out of bed  and ambulation.   UDS: Collected today No results found for: SUMMARY  No results found for: CBDTHCR No results found for: D8THCCBX No results found for: D9THCCBX  ROS  Constitutional: Denies any fever or chills Gastrointestinal: No reported hemesis, hematochezia, vomiting, or acute GI  distress Musculoskeletal: Right cervicalgia Neurological: No reported episodes of acute onset apraxia, aphasia, dysarthria, agnosia, amnesia, paralysis, loss of coordination, or loss of consciousness  Medication Review  ARIPiprazole , Belimumab, HYDROcodone -acetaminophen , SYRINGE 3CC/25GX1, apixaban , azelastine, carbidopa-levodopa, cholecalciferol , clonazePAM , clopidogrel , cyanocobalamin , famotidine , fluticasone , gabapentin , hydrALAZINE , hydrOXYzine , hydroxychloroquine, loratadine , losartan , meclizine , metoprolol  succinate, midodrine , montelukast , multivitamin with minerals, mycophenolate , oxybutynin , pantoprazole , rosuvastatin , sertraline , solifenacin , tadalafil , tamsulosin , and traMADol   History Review  Allergy: Mr. Hamme is allergic to enalapril and vicodin [hydrocodone -acetaminophen ]. Drug: Mr. Gunderman  reports no history of drug use. Alcohol:  reports no history of alcohol use. Tobacco:  reports that he has never smoked. He has never used smokeless tobacco. Social: Mr. Morozov  reports that he has never smoked. He has never used smokeless tobacco. He reports that he does not drink alcohol and does not use drugs. 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), Closed left hip fracture, initial encounter (HCC) (02/18/2023), COVID-19 virus infection (12/2020), Depression, DVT (deep venous thrombosis) (HCC), Dyspnea, GERD (gastroesophageal reflux disease), Head injury (11/05/2023), Heart attack (HCC) (11/05/2023), Heel spur, left (02/16/2019), Heel spur, right (02/16/2019), Hematoma of groin (11/08/2023), Herpes zoster infection of lumbar region (02/20/2020), History of DVT (deep vein thrombosis) (03/22/2020), History of kidney stones, Hyperlipidemia, Hypertension, Long term  current use of systemic steroids (11/08/2020), Lupus, Lymphedema (10/07/2018), Morbid obesity (HCC), Opiate abuse, episodic (HCC) (02/26/2018), Osteoporosis, Pain and swelling of right lower extremity (02/03/2023), Pneumonia, PONV (postoperative nausea and vomiting), Postphlebitic syndrome with ulcer, 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 Surgical: Mr. Deziel  has a past surgical history that includes Cyst excision (92 or 93 ); Ankle surgery (Right); I & D extremity (Right, 04/29/2017); Irrigation and debridement abscess (Left, 04/29/2017); Colonoscopy with propofol  (N/A, 05/28/2020); Flexible bronchoscopy (N/A, 11/01/2021); Bronchial washings (N/A, 11/01/2021); Back surgery; Hip pinning, cannulated (Left, 02/19/2023); Colonoscopy with propofol  (N/A, 07/01/2023); polypectomy (07/01/2023); Coronary/Graft Acute MI Revascularization (N/A, 11/05/2023); LEFT HEART CATH AND CORONARY ANGIOGRAPHY (N/A, 11/05/2023); and Coronary Ultrasound/IVUS (N/A, 11/05/2023). Family: family history includes Clotting disorder in his mother; Heart attack in his maternal grandfather, maternal grandmother, and paternal grandfather; Heart disease in his father; Hypertension in his father; Kidney disease in his brother.  Laboratory Chemistry Profile   Renal Lab Results  Component Value Date   BUN 30 (H) 02/27/2024   CREATININE 1.57 (H) 02/27/2024   LABCREA 153 11/08/2023   BCR 16 02/10/2024   GFRAA 53 (L) 11/07/2020   GFRNONAA 50 (L) 02/27/2024    Hepatic Lab Results  Component Value Date   AST 15 02/16/2024   ALT 15 02/16/2024   ALBUMIN  3.7 02/16/2024   ALKPHOS 91 02/16/2024   LIPASE 34 11/05/2023    Electrolytes Lab Results  Component Value Date    NA 136 02/27/2024   K 4.0 02/27/2024   CL 110 02/27/2024   CALCIUM  8.6 (L) 02/27/2024   MG 2.0 11/06/2023   PHOS 3.8 11/06/2023  Bone Lab Results  Component Value Date   VD25OH 35 11/15/2018    Inflammation (CRP: Acute Phase) (ESR: Chronic Phase) Lab Results  Component Value Date   CRP 0.8 09/10/2021   ESRSEDRATE 25 (H) 09/10/2021   LATICACIDVEN 1.3 08/22/2021         Note: Above Lab results reviewed.  Recent Imaging Review  DG PAIN CLINIC C-ARM 1-60 MIN NO REPORT Fluoro was used, but no Radiologist interpretation will be provided.  Please refer to NOTES tab for provider progress note. Note: Reviewed        Physical Exam  Vitals: BP (!) 136/98   Pulse 96   Temp 97.6 F (36.4 C)   Resp 16   Ht 6' 2 (1.88 m)   Wt 215 lb (97.5 kg)   SpO2 100%   BMI 27.60 kg/m  BMI: Estimated body mass index is 27.6 kg/m as calculated from the following:   Height as of this encounter: 6' 2 (1.88 m).   Weight as of this encounter: 215 lb (97.5 kg). Ideal: Ideal body weight: 82.2 kg (181 lb 3.5 oz) Adjusted ideal body weight: 88.3 kg (194 lb 11.7 oz) General appearance: Well nourished, well developed, and well hydrated. In no apparent acute distress Mental status: Alert, oriented x 3 (person, place, & time)       Respiratory: No evidence of acute respiratory distress Eyes: PERLA   Assessment   Diagnosis Status  1. Cervical facet joint syndrome   2. Cervical radicular pain   3. History of lumbar spinal fusion (L5-S1)   4. Cervical spondylosis without myelopathy   5. Chronic pain syndrome    Persistent Persistent Controlled   Updated Problems: No problems updated.  Plan of Care  Instructed patient that I can offer him cervical injections once he is safely able to come off Eliquis  and Plavix .  He states that he we will discuss that with his cardiology and managing team.  I recommend that he start gabapentin  as below and can consider hydrocodone  for severe breakthrough  pain  Mr. JARELL MCEWEN has a current medication list which includes the following long-term medication(s): apixaban , carbidopa-levodopa, clonazepam , famotidine , fluticasone , gabapentin , hydralazine , losartan , metoprolol  succinate, montelukast , pantoprazole , rosuvastatin , sertraline , and tadalafil .  Pharmacotherapy (Medications Ordered): Meds ordered this encounter  Medications   gabapentin  (NEURONTIN ) 300 MG capsule    Sig: Take 1 capsule (300 mg total) by mouth 2 (two) times daily.    Dispense:  60 capsule    Refill:  2   HYDROcodone -acetaminophen  (NORCO/VICODIN) 5-325 MG tablet    Sig: Take 1-2 tablets by mouth every 12 (twelve) hours as needed for severe pain (pain score 7-10). Must last 30 days    Dispense:  60 tablet    Refill:  0    Chronic Pain: STOP Act (Not applicable) Fill 1 day early if closed on refill date. Avoid benzodiazepines within 8 hours of opioids   Orders:  Orders Placed This Encounter  Procedures   Compliance Drug Analysis, Ur    Volume: 30 ml(s). Minimum 3 ml of urine is needed. Document temperature of fresh sample. Indications: Long term (current) use of opiate analgesic (S20.108) Test#: [id number] (Comprehensive Profile)    Release to patient:   Immediate    Return in about 4 weeks (around 08/16/2024) for MM, F2F.    Recent Visits Date Type Provider Dept  07/07/24 Office Visit Marcelino Nurse, MD [facility]  06/15/24 Procedure visit Marcelino Nurse, MD [facility]  05/26/24 Office  Visit Marcelino Nurse, MD [facility]  Showing recent visits within past 90 days and meeting all other requirements Today's Visits Date Type Provider Dept  07/19/24 Office Visit Marcelino Nurse, MD [facility]  Showing today's visits and meeting all other requirements Future Appointments No visits were found meeting these conditions. Showing future appointments within next 90 days and meeting all other requirements  I discussed the assessment  and treatment plan with the patient. The patient was provided an opportunity to ask questions and all were answered. The patient agreed with the plan and demonstrated an understanding of the instructions.  Patient advised to call back or seek an in-person evaluation if the symptoms or condition worsens.  Duration of encounter: .  Total time on encounter, as per AMA guidelines included both the face-to-face and non-face-to-face time personally spent by the physician and/or other qualified health care professional(s) on the day of the encounter (includes time in activities that require the physician or other qualified health care professional and does not include time in activities normally performed by clinical staff). Physician's time may include the following activities when performed: Preparing to see the patient (e.g., pre-charting review of records, searching for previously ordered imaging, lab work, and nerve conduction tests) Review of prior analgesic pharmacotherapies. Reviewing PMP Interpreting ordered tests (e.g., lab work, imaging, nerve conduction tests) Performing post-procedure evaluations, including interpretation of diagnostic procedures Obtaining and/or reviewing separately obtained history Performing a medically appropriate examination and/or evaluation Counseling and educating the patient/family/caregiver Ordering medications, tests, or procedures Referring and communicating with other health care professionals (when not separately reported) Documenting clinical information in the electronic or other health record Independently interpreting results (not separately reported) and communicating results to the patient/ family/caregiver Care coordination (not separately reported)  Note by: Nurse Marcelino, MD (TTS and AI technology used. I apologize for any typographical errors that were not detected and corrected.) Date: 07/19/2024; Time: 12:41 PM

## 2024-07-19 NOTE — Progress Notes (Signed)
 Safety precautions to be maintained throughout the outpatient stay will include: orient to surroundings, keep bed in low position, maintain call bell within reach at all times, provide assistance with transfer out of bed and ambulation.

## 2024-07-19 NOTE — Telephone Encounter (Signed)
 Pt wants to discuss why he cannot have injections because he cannot be off his medication per out office.Please advise

## 2024-07-20 ENCOUNTER — Encounter: Payer: Self-pay | Admitting: Family Medicine

## 2024-07-20 ENCOUNTER — Telehealth: Payer: Self-pay | Admitting: Student in an Organized Health Care Education/Training Program

## 2024-07-20 NOTE — Telephone Encounter (Signed)
 Attempted to call this number, I was not able to speak to a person.

## 2024-07-20 NOTE — Telephone Encounter (Signed)
 Please call [facility] regarding the prior auth for  Elby Blackwelder Ph [phone] Ref # [id number] Within 22 hours

## 2024-07-21 ENCOUNTER — Telehealth: Payer: Self-pay

## 2024-07-21 DIAGNOSIS — F332 Major depressive disorder, recurrent severe without psychotic features: Secondary | ICD-10-CM | POA: Diagnosis not present

## 2024-07-21 NOTE — Telephone Encounter (Signed)
 Telephone call to [facility] to answer clinical questions for Hydrocodone - APAP [place], medication is under clinical review. Will away decision .

## 2024-07-22 LAB — COMPLIANCE DRUG ANALYSIS, UR

## 2024-07-25 ENCOUNTER — Ambulatory Visit: Attending: Neurosurgery

## 2024-07-25 DIAGNOSIS — R262 Difficulty in walking, not elsewhere classified: Secondary | ICD-10-CM | POA: Insufficient documentation

## 2024-07-25 DIAGNOSIS — M5459 Other low back pain: Secondary | ICD-10-CM | POA: Insufficient documentation

## 2024-07-25 DIAGNOSIS — M542 Cervicalgia: Secondary | ICD-10-CM | POA: Diagnosis not present

## 2024-07-25 DIAGNOSIS — M5416 Radiculopathy, lumbar region: Secondary | ICD-10-CM | POA: Diagnosis not present

## 2024-07-25 DIAGNOSIS — M5412 Radiculopathy, cervical region: Secondary | ICD-10-CM | POA: Insufficient documentation

## 2024-07-25 NOTE — Therapy (Signed)
 OUTPATIENT PHYSICAL THERAPY TREATMENT   Patient Name: Jonathon Snow MRN: [id number] DOB:1961/02/28, 63 y.o., male Today's Date: 07/25/2024  END OF SESSION:  PT End of Session - 07/25/24 0735     Visit Number 15    Number of Visits 33    Date for PT Re-Evaluation 08/26/24    Authorization Type HB auth#[id number] for 4 PT vsts from 7/9-8/7    Authorization - Visit Number 2    Authorization - Number of Visits 4    PT Start Time 0735    PT Stop Time 0816    PT Time Calculation (min) 41 min    Activity Tolerance Patient tolerated treatment well;Patient limited by pain    Behavior During Therapy [facility] for tasks assessed/performed                        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   Overview:  superior sagittal sinus, left transverse sinus and cortical veins    Closed left hip fracture, initial encounter (HCC) 02/18/2023   COVID-19 virus infection 12/2020   Depression    DVT (deep venous thrombosis) (HCC)    Dyspnea    GERD (gastroesophageal reflux disease)    Head injury 11/05/2023   Heart attack (HCC) 11/05/2023   Heel spur, left 02/16/2019   Heel spur, right 02/16/2019   Hematoma of groin 11/08/2023   Herpes zoster infection of lumbar region 02/20/2020   History of DVT (deep vein thrombosis) 03/22/2020   History of kidney stones    Hyperlipidemia    Hypertension    Long term current use of systemic steroids 11/08/2020   Lupus    Lymphedema 10/07/2018   Morbid obesity (HCC)    Opiate abuse, episodic (HCC) 02/26/2018   Osteoporosis    Pain and swelling of right lower extremity 02/03/2023   Pneumonia    PONV (postoperative nausea and vomiting)    Postphlebitic syndrome with ulcer, 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   Presence of  IVC filter 03/22/2020   Removed   Pulmonary embolism (HCC)    Renal disorder    Stage III   Severe episode of recurrent major depressive disorder, 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   Shock circulatory (HCC) 11/06/2023   STEMI (ST elevation myocardial infarction) (HCC) 11/05/2023   Past Surgical History:  Procedure Laterality Date   ANKLE SURGERY Right    BACK SURGERY     BRONCHIAL WASHINGS N/A 11/01/2021   Procedure: BRONCHIAL WASHINGS;  Surgeon: Parris Manna, MD;  Location: [facility] ORS;  Service: Thoracic;  Laterality: N/A;   COLONOSCOPY WITH PROPOFOL  N/A 05/28/2020   Procedure: COLONOSCOPY WITH PROPOFOL ;  Surgeon: Therisa Bi, MD;  Location: [facility] ENDOSCOPY;  Service: Endoscopy;  Laterality: N/A;   COLONOSCOPY WITH PROPOFOL  N/A 07/01/2023   Procedure: COLONOSCOPY WITH PROPOFOL ;  Surgeon: Therisa Bi, MD;  Location: [facility] ENDOSCOPY;  Service: Gastroenterology;  Laterality: N/A;   CORONARY ULTRASOUND/IVUS N/A 11/05/2023   Procedure: Coronary Ultrasound/IVUS;  Surgeon: Mady Bruckner, MD;  Location: [facility] INVASIVE CV LAB;  Service: Cardiovascular;  Laterality: N/A;   CORONARY/GRAFT ACUTE MI REVASCULARIZATION N/A 11/05/2023   Procedure: Coronary/Graft Acute MI Revascularization;  Surgeon: Mady Bruckner, MD;  Location: [facility] INVASIVE CV LAB;  Service: Cardiovascular;  Laterality: N/A;   CYST EXCISION  92  or 93    Liver cyst removal [facility]   FLEXIBLE BRONCHOSCOPY N/A 11/01/2021   Procedure: FLEXIBLE BRONCHOSCOPY;  Surgeon: Parris Manna, MD;  Location: [facility] ORS;  Service: Thoracic;  Laterality: N/A;   HIP PINNING,CANNULATED Left 02/19/2023   Procedure: PERCUTANEOUS FIXATION OF FEMORAL NECK;  Surgeon: Tobie Priest, MD;  Location: [facility] ORS;  Service: Orthopedics;  Laterality: Left;   I & D EXTREMITY Right 04/29/2017   Procedure: IRRIGATION AND DEBRIDEMENT EXTREMITY;  Surgeon: Shelva Dunnings, MD;  Location: [facility] ORS;  Service: General;  Laterality: Right;   IRRIGATION AND  DEBRIDEMENT ABSCESS Left 04/29/2017   Procedure: IRRIGATION AND DEBRIDEMENT Scrotal ABSCESS;  Surgeon: Shelva Dunnings, MD;  Location: [facility] ORS;  Service: General;  Laterality: Left;   LEFT HEART CATH AND CORONARY ANGIOGRAPHY N/A 11/05/2023   Procedure: LEFT HEART CATH AND CORONARY ANGIOGRAPHY;  Surgeon: Mady Bruckner, MD;  Location: [facility] INVASIVE CV LAB;  Service: Cardiovascular;  Laterality: N/A;   POLYPECTOMY  07/01/2023   Procedure: POLYPECTOMY;  Surgeon: Therisa Bi, MD;  Location: [facility] ENDOSCOPY;  Service: Gastroenterology;;   Patient Active Problem List   Diagnosis Date Noted   Cervical radicular pain 05/26/2024   History of thoracic spinal fusion (T7-T12) 05/26/2024   History of lumbar spinal fusion (L5-S1) 05/26/2024   Chronic pain syndrome 05/26/2024   Anxiety and depression 02/26/2024   Dyslipidemia 02/26/2024   Peripheral neuropathy 02/26/2024   Essential hypertension 02/26/2024   Vertigo 02/26/2024   Syncope 02/25/2024   Orthostatic hypotension 02/11/2024   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) 01/20/2024   Anxiety 12/14/2023   Overweight (BMI 25.0-29.9) 11/23/2023   Chronic heart failure with preserved ejection fraction (HFpEF) (HCC) 11/23/2023   BPH (benign prostatic hyperplasia) 11/23/2023   Unstable angina (HCC) 11/10/2023   Closed T10 spinal fracture (HCC) 11/06/2023   Closed tricolumnar fracture of thoracic vertebra (HCC) 11/06/2023   Thoracic spine instability 11/06/2023   Chronic bilateral low back pain with left-sided sciatica 07/07/2023   Neuropathy 07/07/2023   Hx of colonic polyps 07/01/2023   Adenomatous polyp of colon 07/01/2023   Erectile disorder 01/22/2023   Coronary artery disease involving native coronary artery of native heart without angina pectoris 09/09/2022   Varicose 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 01/03/2022   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) 09/18/2021   Prediabetes  11/08/2020   Stage 3a chronic kidney disease (HCC) 11/08/2020   Seasonal allergies 11/08/2020   Rhinosinusitis 11/08/2020   Anticoagulant disorder (HCC) 11/07/2020   Senile purpura (HCC) 11/07/2020   MDD (major depressive disorder), recurrent episode, mild (HCC) 09/12/2019   Other spondylosis with radiculopathy, lumbar region 02/16/2019   Osteoporosis 10/12/2018   Chronic venous insufficiency 10/07/2018   Lymphedema 10/07/2018   SLE glomerulonephritis syndrome, 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HCC) 03/03/2018   Syncope and collapse 12/29/2017   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) 10/29/2016   Hyperlipidemia LDL goal <70 01/15/2016   Uncontrolled hypertension 01/15/2016   Obesity (BMI 30.0-34.9) 01/15/2016   Long term current use of anticoagulant 10/04/2015   Systemic lupus erythematosus (HCC) 05/30/2015   COPD, moderate (HCC) 06/27/2014   Shortness of breath 06/27/2014   History of pulmonary embolism 12/11/2013   Nodule of right lung 12/11/2013   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   Cystic disease of liver 08/21/2013    PCP: [name], PA-C REFERRING PROVIDER: Clois Fret, MD REFERRING DIAG: [phone] (ICD-10-CM) - Cervical radiculopathy M54.50,G89.29 (ICD-10-CM) - Chronic bilateral  low back pain without sciatica  Rationale for Evaluation and Treatment: Rehabilitation  THERAPY DIAG:  Cervicalgia  Radiculopathy, cervical region  ONSET DATE: November 2024  SUBJECTIVE:                                                                                                                                                                                           SUBJECTIVE STATEMENT:  Blood pressure had been up and down since they changed his medication since November 2024. Was low yesterday. Currently back to his lorsartan medication (his original medication) for a while. Only had 3 hours of sleep yesterday. Has a posterior  headache, as well as parietal and eye brow area.         PERTINENT HISTORY:  Cervicalgia, low back pain. Pt also reports R > L C5/C6 dermatome to elbow bilaterally. Pt has a difficult time turning his head and looking up. No neck surgery. Had Thoracic and lumbar spine fusion surgery. Had mid back surgery (thoracic) November 2024 due to his fall. Neck pain is more recent. Neck and shoulder pain began November 2024 after his thoracic surgery. Usually sleeps on his L side but is currently difficulty to do secondary to B shoulder and neck pain, so pt is not sleeping well. Neck pain is the same since November 2024. Pt tends to have issues getting dizzy and passes out when he is standing and exerting himself  Vision gets blurry and splotchy. Room feels like it is waving around, pt is not walking on a stable surface, feels like he is walking on a boat and not having sea legs.   The IVC filter was removed.  Still has L LE DVT. Pt states taking Elequis. L LE DVT is still being treated.    PAIN:  Are you having pain? 5/10 pain in bilat UT area of neck and shoulders Rt> Lt.   PRECAUTIONS:  -dizzy and passes out when he is standing and exerting himself; chronic orthostatic hypotension; Lt LE DVT, fall risk  RED FLAGS: Bowel or bladder incontinence: No and Cauda equina syndrome: No   WEIGHT BEARING RESTRICTIONS: No  FALLS:  Has patient fallen in last 6 months? Yes. Number of falls 2 (original fall was when pt had a heart attack November 2024 and hurt his back. February 22, 2024, pt passed out and fell into the bath tub.   LIVING ENVIRONMENT: Lives with: lives alone Lives in: House/apartment Stairs: No Has following equipment at home: Single point cane and Environmental consultant - 2 wheeled  OCCUPATION: Not currently working  PLOF: Needs assistance with homemaking   PATIENT GOALS: be able to do everything  better.   NEXT MD VISIT: Yes but does not know when.   OBJECTIVE:  Note: Objective measures were  completed at Evaluation unless otherwise noted.  DIAGNOSTIC FINDINGS:  MR CERVICAL SPINE WO CONTRAST 02/26/2024  Narrative & Impression  CLINICAL DATA:  Myelopathy, acute, cervical spine   EXAM: MRI CERVICAL SPINE WITHOUT CONTRAST   TECHNIQUE: Multiplanar, multisequence MR imaging of the cervical spine was performed. No intravenous contrast was administered.   COMPARISON:  CT of the cervical spine November 05, 2023.   FINDINGS: Alignment: No substantial sagittal subluxation.   Vertebrae: No evidence of acute fracture, evidence of discitis, or suspicious bone lesion. Benign vertebral venous malformation at T3. Mild T1-T2 height loss, unchanged.   Cord: Normal cord signal.  Neck   Posterior Fossa, vertebral arteries, paraspinal tissues: Visualized vertebral artery flow voids are maintained. No paraspinal edema.   Disc levels:   C2-C3: No significant disc protrusion, foraminal stenosis, or canal stenosis.   C3-C4: No significant disc protrusion, foraminal stenosis, or canal stenosis.   C4-C5: Right eccentric posterior disc osteophyte complex with right greater than left facet and uncovertebral hypertrophy. Resulting mild right greater than left foraminal stenosis. Mild canal stenosis.   C5-C6: Posterior disc osteophyte complex ligamentum flavum thickening and right greater than left facet and uncovertebral hypertrophy. Resulting mild-to-moderate bilateral foraminal stenosis. Mild to moderate canal stenosis.   C6-C7: Posterior disc osteophyte complex with bilateral facet and uncovertebral hypertrophy. Resulting moderate bilateral foraminal stenosis. Mild canal stenosis.   C7-T1: No significant disc protrusion, foraminal stenosis, or canal stenosis.   IMPRESSION: 1. 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 2. At C5-C6, mild to moderate canal and bilateral foraminal stenosis. 3. At C4-C5, mild canal and bilateral foraminal stenosis.      Electronically Signed   By: Gilmore GORMAN Molt M.D.   On: 02/27/2024 02:42         PATIENT SURVEYS:  Neck Disability Index score: 41/50 (82%) (05/02/2024)  COGNITION: Overall cognitive status: Within functional limits for tasks assessed     SENSATION:   MUSCLE LENGTH:   POSTURE:   PALPATION: TTP to posterior neck   CERVICAL ROM:   Active ROM A/PROM (deg) eval  Flexion WFL with posterior neck pain  Extension Very limited with pain, movement preference around C5/C6 area  Right lateral flexion Very limited with pain  Left lateral flexion Very limited with pain  Right rotation 20 degrees with pain  Left rotation 22 degrees with pain   (Blank rows = not tested)  UPPER EXTREMITY MMT:  MMT Right eval Left eval  Shoulder flexion 3+ with neck and shoulder and arm pain 4- with pain at available range.   Shoulder extension    Shoulder abduction    Shoulder adduction    Shoulder extension    Shoulder internal rotation    Shoulder external rotation    Middle trapezius    Lower trapezius (seated manually resisted) 3+ with neck pain 3+ with neck pain  Elbow flexion    Elbow extension    Wrist flexion    Wrist extension    Wrist ulnar deviation    Wrist radial deviation    Wrist pronation    Wrist supination    Grip strength     (Blank rows = not tested)  UPPER EXTREMITY ROM:  Active ROM Right eval Left eval  Shoulder flexion 55 (scaption) 55 (scaption)  Shoulder extension    Shoulder abduction    Shoulder adduction  Shoulder extension    Shoulder internal rotation    Shoulder external rotation    Elbow flexion    Elbow extension    Wrist flexion    Wrist extension    Wrist ulnar deviation    Wrist radial deviation    Wrist pronation    Wrist supination     (Blank rows = not tested)    LUMBAR ROM:   AROM eval  Flexion   Extension   Right lateral flexion   Left lateral flexion   Right rotation   Left rotation    (Blank rows = not  tested)  LOWER EXTREMITY ROM:     Passive  Right eval Left eval  Hip flexion    Hip extension    Hip abduction    Hip adduction    Hip internal rotation    Hip external rotation    Knee flexion    Knee extension    Ankle dorsiflexion    Ankle plantarflexion    Ankle inversion    Ankle eversion     (Blank rows = not tested)  LOWER EXTREMITY MMT:    MMT Right eval Left eval  Hip flexion    Hip extension    Hip abduction    Hip adduction    Hip internal rotation    Hip external rotation    Knee flexion    Knee extension    Ankle dorsiflexion    Ankle plantarflexion    Ankle inversion    Ankle eversion     (Blank rows = not tested)  GAIT: Distance walked: 30 ft Assistive device utilized: None Level of assistance: SBA Comments: antalgic, decreased stance R LE, forward flexed, decreased gait speed  TREATMENT DATE: 07/25/2024    Manual therapy   At start of session: Blood pressure L arm sitting, mechanically taken, normal cuff: 167/94, HR 66 SpO2 100 %, room air  Seated STM to B cervical paraspinal, L and R distal scalane, R rhomboid muscle areas to decrease tension  Better able to tolerate manual therapy today compared to previous sessions.  Slight decrease in headache reported afterwards  At end of session: Blood pressure L arm sitting, mechanically taken, normal cuff: 147/97, HR 70 SpO2 100 %, room air      PATIENT EDUCATION:  Education details: POC Person educated: Patient Education method: Explanation Education comprehension: verbalized understanding  HOME EXERCISE PROGRAM: Pt was recommended to press his tongue at the roof of his mouth to activate his anterior cervical muscles throughout the day. Pt demonstrated and verbalized understanding.    Access Code: [email or web address] URL: [email or web address] Date: 05/10/2024 Prepared by: Emil Glassman  Exercises - Seated Cervical Flexion AROM  - 2 x daily - 7 x weekly - 3 sets - 5 reps -  Seated Transversus Abdominis Bracing  - 1 x daily - 7 x weekly - 2 -3 sets - 10 reps - 5 seconds hold - Seated Chin Tuck with Neck Elongation  - 2-3 x daily - 7 x weekly - 2-3 sets - 5 reps - Seated Cervical Sidebending AROM  - 3 x daily - 7 x weekly - 4 sets - 5 reps  ASSESSMENT:  CLINICAL IMPRESSION: Utilized more STM today to decrease muscle tension to the neck secondary to elevated blood pressure levels. Better able to tolerate manual therapy today compared to previous sessions.  Slight decrease in headache reported after today's session. Pt will benefit from continued skilled physical therapy services to decrease pain, improve strength and  function.     OBJECTIVE IMPAIRMENTS: Abnormal gait, decreased balance, decreased endurance, difficulty walking, decreased ROM, decreased strength, dizziness, impaired UE functional use, improper body mechanics, postural dysfunction, and pain.   ACTIVITY LIMITATIONS: carrying, lifting, bending, sitting, standing, squatting, sleeping, stairs, transfers, bed mobility, bathing, toileting, dressing, reach over head, hygiene/grooming, locomotion level, and caring for others  PARTICIPATION LIMITATIONS:   PERSONAL FACTORS: Age, Fitness, Past/current experiences, Time since onset of injury/illness/exacerbation, and 3+ comorbidities: DVT, depression, dyspnea, heart attack, HTN, osteoporosis, are also affecting patient's functional outcome.   REHAB POTENTIAL: Fair    CLINICAL DECISION MAKING: Stable/uncomplicated  EVALUATION COMPLEXITY: Low   GOALS: Goals reviewed with patient? Yes  SHORT TERM GOALS: Target date: 05/13/2024  Pt will be independent with his initial HEP to improve strength, decrease pain, improve ability to lift, look around as well as ambulate and perform standing tasks with less difficulty and pain. Baseline: Pt has not yet officially started his initial HEP (05/02/2024) Goal status: ACHIEVED   LONG TERM GOALS: Target date:  07/01/2024  Pt will have a decrease in neck pain to 5/10 or less at worst to promote ability to look around more comfortably.  Baseline: 9/10 neck pain at worst for the past 3 months (05/02/2024); 6/10 at most for 7 days (06/02/2024); 06/14/24: 7-8/10 ; 6/10 at most for the past 7 days (06/27/2024) Goal status: Progressing  2.  Pt will have a decrease on low back pain to 4/10 or less at worst to promote ability to tolerate sitting, standing, walking and performing standing tasks more comfortably for his back.  Baseline: 8/10 low back pain at worst for past 7 days; 6/10 worst pain in past week 06/14/24; 7/10 at most for the past 7 days (06/27/2024) Goal status: PROGRESSING  3.  Pt will improve his Neck Disability Index (NDI) score by at least 20% as a demonstration of improved function.  Baseline: Neck Disability Index score: 41/50 (82%) (05/02/2024); 06/14/24: 58% NDI Goal status: ACHIEVED   4.  Pt will improve his cervical rotation AROM to 50 degrees or more to promote ability to look around more comfortably. Baseline:  Active ROM A/PROM (deg) eval AROM (06/02/2024)  Right rotation 20 degrees with pain 30 degrees with pain  Left rotation 22 degrees with pain 35 degrees with pain   (05/02/2024) Goal status: PROGRESSING  5.  Pt will improve B shoulder flexion AROM to 100 degrees or more to promote ability to reach as well as lift more comfortably.  Baseline:  Active ROM Right eval Left eval R (06/02/2024) L (06/02/2024) R (06/27/2024) L  (06/27/2024)  Shoulder flexion 55 (scaption) 55 (scaption) 44  (Scaption) 65  (Scaption) 56 degrees with R upper trap and lateral shoulder and arm pain to elbow.  83 degrees with anterior shoulder and arm pain   (05/02/2024)  Goal status: ONGOING  6.  Pt will improve his shoulder flexion and lower trap strength by at least 1/2 MMT grade to promote ability to raise his arms as well as lift with less difficulty.  Baseline:  MMT Right eval Left eval  R (06/27/2024) L (06/27/2024)  Shoulder flexion 3+ with neck and shoulder and arm pain 4- with pain at available range.  4-/5 manual resistance at available range with R UE pain to hand 4/5 manual resistance at available range  Lower trapezius (seated manually resisted) 3+ with neck pain 3+ with neck pain 3+ 4   (05/02/2024) Goal status: IN PROGRESS   PLAN:  PT FREQUENCY: 1-2x/week  PT DURATION: 8 weeks  PLANNED INTERVENTIONS: 97110-Therapeutic exercises, 97530- Therapeutic activity, [id number]- Neuromuscular re-education, 97535- Self Care, [id number]- Manual therapy, [phone]- Gait training, [phone]- Electrical stimulation (unattended), [phone]- Ionotophoresis 4mg /ml Dexamethasone , Patient/Family education, and Balance training.  PLAN FOR NEXT SESSION: posture, B scapular, anterior cervical, trunk and glute strengthening, mechanics, manual techniques, modalities PRN   Crisanto Nied, PT, DPT 07/25/2024, 12:20 PM

## 2024-07-27 ENCOUNTER — Ambulatory Visit: Admitting: Student in an Organized Health Care Education/Training Program

## 2024-07-28 ENCOUNTER — Ambulatory Visit (INDEPENDENT_AMBULATORY_CARE_PROVIDER_SITE_OTHER): Admitting: Family Medicine

## 2024-07-28 ENCOUNTER — Encounter: Payer: Self-pay | Admitting: Family Medicine

## 2024-07-28 ENCOUNTER — Ambulatory Visit

## 2024-07-28 VITALS — BP 128/82 | HR 86 | Resp 16 | Ht 74.0 in | Wt 215.0 lb

## 2024-07-28 DIAGNOSIS — G629 Polyneuropathy, unspecified: Secondary | ICD-10-CM | POA: Diagnosis not present

## 2024-07-28 DIAGNOSIS — M5412 Radiculopathy, cervical region: Secondary | ICD-10-CM | POA: Diagnosis not present

## 2024-07-28 DIAGNOSIS — Z79899 Other long term (current) drug therapy: Secondary | ICD-10-CM | POA: Diagnosis not present

## 2024-07-28 DIAGNOSIS — R202 Paresthesia of skin: Secondary | ICD-10-CM

## 2024-07-28 DIAGNOSIS — D649 Anemia, unspecified: Secondary | ICD-10-CM | POA: Insufficient documentation

## 2024-07-28 DIAGNOSIS — M542 Cervicalgia: Secondary | ICD-10-CM | POA: Diagnosis not present

## 2024-07-28 DIAGNOSIS — G894 Chronic pain syndrome: Secondary | ICD-10-CM

## 2024-07-28 DIAGNOSIS — M5416 Radiculopathy, lumbar region: Secondary | ICD-10-CM | POA: Diagnosis not present

## 2024-07-28 DIAGNOSIS — F330 Major depressive disorder, recurrent, mild: Secondary | ICD-10-CM

## 2024-07-28 DIAGNOSIS — D84821 Immunodeficiency due to drugs: Secondary | ICD-10-CM

## 2024-07-28 DIAGNOSIS — E538 Deficiency of other specified B group vitamins: Secondary | ICD-10-CM | POA: Diagnosis not present

## 2024-07-28 DIAGNOSIS — M329 Systemic lupus erythematosus, unspecified: Secondary | ICD-10-CM | POA: Diagnosis not present

## 2024-07-28 DIAGNOSIS — R7303 Prediabetes: Secondary | ICD-10-CM | POA: Diagnosis not present

## 2024-07-28 DIAGNOSIS — E559 Vitamin D deficiency, unspecified: Secondary | ICD-10-CM | POA: Insufficient documentation

## 2024-07-28 DIAGNOSIS — F332 Major depressive disorder, recurrent severe without psychotic features: Secondary | ICD-10-CM | POA: Diagnosis not present

## 2024-07-28 DIAGNOSIS — M5459 Other low back pain: Secondary | ICD-10-CM | POA: Diagnosis not present

## 2024-07-28 DIAGNOSIS — R262 Difficulty in walking, not elsewhere classified: Secondary | ICD-10-CM | POA: Diagnosis not present

## 2024-07-28 DIAGNOSIS — N1831 Chronic kidney disease, stage 3a: Secondary | ICD-10-CM

## 2024-07-28 DIAGNOSIS — Z131 Encounter for screening for diabetes mellitus: Secondary | ICD-10-CM

## 2024-07-28 NOTE — Assessment & Plan Note (Addendum)
 Vitamin B12 deficiency, on treatment Previously low B12 levels treated with oral supplements and injections. Follow-up with neurology scheduled. Oral supplementation may be insufficient if symptoms persist. - Check current B12 levels to assess effectiveness of treatment. - Consider regular B12 injections if oral supplementation is insufficient. Lab Results  Component Value Date   VITAMINB12 274 10/28/2023

## 2024-07-28 NOTE — Assessment & Plan Note (Signed)
 On supplement, last labs in normal range Last vitamin D  Lab Results  Component Value Date   VD25OH 35 11/15/2018

## 2024-07-28 NOTE — Therapy (Signed)
 OUTPATIENT PHYSICAL THERAPY TREATMENT And Progress Report   Patient Name: Jonathon Snow MRN: [id number] DOB:1961-05-09, 63 y.o., male Today's Date: 07/28/2024  END OF SESSION:  PT End of Session - 07/28/24 0821     Visit Number 16    Number of Visits 33    Date for PT Re-Evaluation 08/26/24    Authorization Type HB auth#[id number] for 4 PT vsts from 7/9-8/7    Authorization - Visit Number 3    Authorization - Number of Visits 4    PT Start Time 0821    PT Stop Time 0906    PT Time Calculation (min) 45 min    Activity Tolerance Patient tolerated treatment well;Patient limited by pain    Behavior During Therapy [facility] for tasks assessed/performed                         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   Overview:  superior sagittal sinus, left transverse sinus and cortical veins    Closed left hip fracture, initial encounter (HCC) 02/18/2023   COVID-19 virus infection 12/2020   Depression    DVT (deep venous thrombosis) (HCC)    Dyspnea    GERD (gastroesophageal reflux disease)    Head injury 11/05/2023   Heart attack (HCC) 11/05/2023   Heel spur, left 02/16/2019   Heel spur, right 02/16/2019   Hematoma of groin 11/08/2023   Herpes zoster infection of lumbar region 02/20/2020   History of DVT (deep vein thrombosis) 03/22/2020   History of kidney stones    Hyperlipidemia    Hypertension    Long term current use of systemic steroids 11/08/2020   Lupus    Lymphedema 10/07/2018   Morbid obesity (HCC)    Opiate abuse, episodic (HCC) 02/26/2018   Osteoporosis    Pain and swelling of right lower extremity 02/03/2023   Pneumonia    PONV (postoperative nausea and vomiting)    Postphlebitic syndrome with ulcer, left (HCC)  11/18/2016   Presence of IVC filter 03/22/2020   Removed   Pulmonary embolism (HCC)    Renal disorder    Stage III   Severe episode of recurrent major depressive disorder, 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   Shock circulatory (HCC) 11/06/2023   STEMI (ST elevation myocardial infarction) (HCC) 11/05/2023   Past Surgical History:  Procedure Laterality Date   ANKLE SURGERY Right    BACK SURGERY     BRONCHIAL WASHINGS N/A 11/01/2021   Procedure: BRONCHIAL WASHINGS;  Surgeon: Parris Manna, MD;  Location: [facility] ORS;  Service: Thoracic;  Laterality: N/A;   COLONOSCOPY WITH PROPOFOL  N/A 05/28/2020   Procedure: COLONOSCOPY WITH PROPOFOL ;  Surgeon: Therisa Bi, MD;  Location: [facility] ENDOSCOPY;  Service: Endoscopy;  Laterality: N/A;   COLONOSCOPY WITH PROPOFOL  N/A 07/01/2023   Procedure: COLONOSCOPY WITH PROPOFOL ;  Surgeon: Therisa Bi, MD;  Location: [facility] ENDOSCOPY;  Service: Gastroenterology;  Laterality: N/A;   CORONARY ULTRASOUND/IVUS N/A 11/05/2023   Procedure: Coronary Ultrasound/IVUS;  Surgeon: Mady Bruckner, MD;  Location: [facility] INVASIVE CV LAB;  Service: Cardiovascular;  Laterality: N/A;   CORONARY/GRAFT ACUTE MI REVASCULARIZATION N/A 11/05/2023   Procedure: Coronary/Graft Acute MI Revascularization;  Surgeon: Mady Bruckner, MD;  Location: [facility] INVASIVE CV LAB;  Service: Cardiovascular;  Laterality: N/A;  CYST EXCISION  92 or 93    Liver cyst removal [facility]   FLEXIBLE BRONCHOSCOPY N/A 11/01/2021   Procedure: FLEXIBLE BRONCHOSCOPY;  Surgeon: Parris Manna, MD;  Location: [facility] ORS;  Service: Thoracic;  Laterality: N/A;   HIP PINNING,CANNULATED Left 02/19/2023   Procedure: PERCUTANEOUS FIXATION OF FEMORAL NECK;  Surgeon: Tobie Priest, MD;  Location: [facility] ORS;  Service: Orthopedics;  Laterality: Left;   I & D EXTREMITY Right 04/29/2017   Procedure: IRRIGATION AND DEBRIDEMENT EXTREMITY;  Surgeon: Shelva Dunnings, MD;  Location: [facility] ORS;  Service: General;  Laterality: Right;    IRRIGATION AND DEBRIDEMENT ABSCESS Left 04/29/2017   Procedure: IRRIGATION AND DEBRIDEMENT Scrotal ABSCESS;  Surgeon: Shelva Dunnings, MD;  Location: [facility] ORS;  Service: General;  Laterality: Left;   LEFT HEART CATH AND CORONARY ANGIOGRAPHY N/A 11/05/2023   Procedure: LEFT HEART CATH AND CORONARY ANGIOGRAPHY;  Surgeon: Mady Bruckner, MD;  Location: [facility] INVASIVE CV LAB;  Service: Cardiovascular;  Laterality: N/A;   POLYPECTOMY  07/01/2023   Procedure: POLYPECTOMY;  Surgeon: Therisa Bi, MD;  Location: [facility] ENDOSCOPY;  Service: Gastroenterology;;   Patient Active Problem List   Diagnosis Date Noted   Cervical radicular pain 05/26/2024   History of thoracic spinal fusion (T7-T12) 05/26/2024   History of lumbar spinal fusion (L5-S1) 05/26/2024   Chronic pain syndrome 05/26/2024   Anxiety and depression 02/26/2024   Dyslipidemia 02/26/2024   Peripheral neuropathy 02/26/2024   Essential hypertension 02/26/2024   Vertigo 02/26/2024   Syncope 02/25/2024   Orthostatic hypotension 02/11/2024   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) 01/20/2024   Anxiety 12/14/2023   Overweight (BMI 25.0-29.9) 11/23/2023   Chronic heart failure with preserved ejection fraction (HFpEF) (HCC) 11/23/2023   BPH (benign prostatic hyperplasia) 11/23/2023   Unstable angina (HCC) 11/10/2023   Closed T10 spinal fracture (HCC) 11/06/2023   Closed tricolumnar fracture of thoracic vertebra (HCC) 11/06/2023   Thoracic spine instability 11/06/2023   Chronic bilateral low back pain with left-sided sciatica 07/07/2023   Neuropathy 07/07/2023   Hx of colonic polyps 07/01/2023   Adenomatous polyp of colon 07/01/2023   Erectile disorder 01/22/2023   Coronary artery disease involving native coronary artery of native heart without angina pectoris 09/09/2022   Varicose 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 01/03/2022   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) 09/18/2021    Prediabetes 11/08/2020   Stage 3a chronic kidney disease (HCC) 11/08/2020   Seasonal allergies 11/08/2020   Rhinosinusitis 11/08/2020   Anticoagulant disorder (HCC) 11/07/2020   Senile purpura (HCC) 11/07/2020   MDD (major depressive disorder), recurrent episode, mild (HCC) 09/12/2019   Other spondylosis with radiculopathy, lumbar region 02/16/2019   Osteoporosis 10/12/2018   Chronic venous insufficiency 10/07/2018   Lymphedema 10/07/2018   SLE glomerulonephritis syndrome, 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HCC) 03/03/2018   Syncope and collapse 12/29/2017   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) 10/29/2016   Hyperlipidemia LDL goal <70 01/15/2016   Uncontrolled hypertension 01/15/2016   Obesity (BMI 30.0-34.9) 01/15/2016   Long term current use of anticoagulant 10/04/2015   Systemic lupus erythematosus (HCC) 05/30/2015   COPD, moderate (HCC) 06/27/2014   Shortness of breath 06/27/2014   History of pulmonary embolism 12/11/2013   Nodule of right lung 12/11/2013   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   Cystic disease of liver 08/21/2013    PCP: [name], PA-C REFERRING PROVIDER: Clois Fret, MD REFERRING DIAG: [phone] (ICD-10-CM) - Cervical radiculopathy M54.50,G89.29 (  ICD-10-CM) - Chronic bilateral low back pain without sciatica  Rationale for Evaluation and Treatment: Rehabilitation  THERAPY DIAG:  Cervicalgia  Radiculopathy, cervical region  ONSET DATE: November 2024  SUBJECTIVE:                                                                                                                                                                                           SUBJECTIVE STATEMENT:  Neck has seen better days. It has been rough lately, does not know if it is because of the rain. 5/10 neck pain currently. Was rough last night, had a hard time getting comfortable.   Feels like PT is helping him but not quickly  enough.   Neck felt a little more lose after last session.         PERTINENT HISTORY:  Cervicalgia, low back pain. Pt also reports R > L C5/C6 dermatome to elbow bilaterally. Pt has a difficult time turning his head and looking up. No neck surgery. Had Thoracic and lumbar spine fusion surgery. Had mid back surgery (thoracic) November 2024 due to his fall. Neck pain is more recent. Neck and shoulder pain began November 2024 after his thoracic surgery. Usually sleeps on his L side but is currently difficulty to do secondary to B shoulder and neck pain, so pt is not sleeping well. Neck pain is the same since November 2024. Pt tends to have issues getting dizzy and passes out when he is standing and exerting himself  Vision gets blurry and splotchy. Room feels like it is waving around, pt is not walking on a stable surface, feels like he is walking on a boat and not having sea legs.   The IVC filter was removed.  Still has L LE DVT. Pt states taking Elequis. L LE DVT is still being treated.    PAIN:  Are you having pain? 5/10 pain in bilat UT area of neck and shoulders Rt> Lt.   PRECAUTIONS:  -dizzy and passes out when he is standing and exerting himself; chronic orthostatic hypotension; Lt LE DVT, fall risk  RED FLAGS: Bowel or bladder incontinence: No and Cauda equina syndrome: No   WEIGHT BEARING RESTRICTIONS: No  FALLS:  Has patient fallen in last 6 months? Yes. Number of falls 2 (original fall was when pt had a heart attack November 2024 and hurt his back. February 22, 2024, pt passed out and fell into the bath tub.   LIVING ENVIRONMENT: Lives with: lives alone Lives in: House/apartment Stairs: No Has following equipment at home: Single point cane and Environmental consultant - 2 wheeled  OCCUPATION: Not currently working  PLOF: Needs  assistance with homemaking   PATIENT GOALS: be able to do everything better.   NEXT MD VISIT: Yes but does not know when.   OBJECTIVE:  Note: Objective  measures were completed at Evaluation unless otherwise noted.  DIAGNOSTIC FINDINGS:  MR CERVICAL SPINE WO CONTRAST 02/26/2024  Narrative & Impression  CLINICAL DATA:  Myelopathy, acute, cervical spine   EXAM: MRI CERVICAL SPINE WITHOUT CONTRAST   TECHNIQUE: Multiplanar, multisequence MR imaging of the cervical spine was performed. No intravenous contrast was administered.   COMPARISON:  CT of the cervical spine November 05, 2023.   FINDINGS: Alignment: No substantial sagittal subluxation.   Vertebrae: No evidence of acute fracture, evidence of discitis, or suspicious bone lesion. Benign vertebral venous malformation at T3. Mild T1-T2 height loss, unchanged.   Cord: Normal cord signal.  Neck   Posterior Fossa, vertebral arteries, paraspinal tissues: Visualized vertebral artery flow voids are maintained. No paraspinal edema.   Disc levels:   C2-C3: No significant disc protrusion, foraminal stenosis, or canal stenosis.   C3-C4: No significant disc protrusion, foraminal stenosis, or canal stenosis.   C4-C5: Right eccentric posterior disc osteophyte complex with right greater than left facet and uncovertebral hypertrophy. Resulting mild right greater than left foraminal stenosis. Mild canal stenosis.   C5-C6: Posterior disc osteophyte complex ligamentum flavum thickening and right greater than left facet and uncovertebral hypertrophy. Resulting mild-to-moderate bilateral foraminal stenosis. Mild to moderate canal stenosis.   C6-C7: Posterior disc osteophyte complex with bilateral facet and uncovertebral hypertrophy. Resulting moderate bilateral foraminal stenosis. Mild canal stenosis.   C7-T1: No significant disc protrusion, foraminal stenosis, or canal stenosis.   IMPRESSION: 1. 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 2. At C5-C6, mild to moderate canal and bilateral foraminal stenosis. 3. At C4-C5, mild canal and bilateral  foraminal stenosis.     Electronically Signed   By: Gilmore GORMAN Molt M.D.   On: 02/27/2024 02:42         PATIENT SURVEYS:  Neck Disability Index score: 41/50 (82%) (05/02/2024)  COGNITION: Overall cognitive status: Within functional limits for tasks assessed     SENSATION:   MUSCLE LENGTH:   POSTURE:   PALPATION: TTP to posterior neck   CERVICAL ROM:   Active ROM A/PROM (deg) eval  Flexion WFL with posterior neck pain  Extension Very limited with pain, movement preference around C5/C6 area  Right lateral flexion Very limited with pain  Left lateral flexion Very limited with pain  Right rotation 20 degrees with pain  Left rotation 22 degrees with pain   (Blank rows = not tested)  UPPER EXTREMITY MMT:  MMT Right eval Left eval  Shoulder flexion 3+ with neck and shoulder and arm pain 4- with pain at available range.   Shoulder extension    Shoulder abduction    Shoulder adduction    Shoulder extension    Shoulder internal rotation    Shoulder external rotation    Middle trapezius    Lower trapezius (seated manually resisted) 3+ with neck pain 3+ with neck pain  Elbow flexion    Elbow extension    Wrist flexion    Wrist extension    Wrist ulnar deviation    Wrist radial deviation    Wrist pronation    Wrist supination    Grip strength     (Blank rows = not tested)  UPPER EXTREMITY ROM:  Active ROM Right eval Left eval  Shoulder flexion 55 (scaption) 55 (scaption)  Shoulder extension  Shoulder abduction    Shoulder adduction    Shoulder extension    Shoulder internal rotation    Shoulder external rotation    Elbow flexion    Elbow extension    Wrist flexion    Wrist extension    Wrist ulnar deviation    Wrist radial deviation    Wrist pronation    Wrist supination     (Blank rows = not tested)    LUMBAR ROM:   AROM eval  Flexion   Extension   Right lateral flexion   Left lateral flexion   Right rotation   Left rotation     (Blank rows = not tested)  LOWER EXTREMITY ROM:     Passive  Right eval Left eval  Hip flexion    Hip extension    Hip abduction    Hip adduction    Hip internal rotation    Hip external rotation    Knee flexion    Knee extension    Ankle dorsiflexion    Ankle plantarflexion    Ankle inversion    Ankle eversion     (Blank rows = not tested)  LOWER EXTREMITY MMT:    MMT Right eval Left eval  Hip flexion    Hip extension    Hip abduction    Hip adduction    Hip internal rotation    Hip external rotation    Knee flexion    Knee extension    Ankle dorsiflexion    Ankle plantarflexion    Ankle inversion    Ankle eversion     (Blank rows = not tested)  GAIT: Distance walked: 30 ft Assistive device utilized: None Level of assistance: SBA Comments: antalgic, decreased stance R LE, forward flexed, decreased gait speed  TREATMENT DATE: 07/28/2024    Therapeutic exercise At start of session: Blood pressure L arm sitting, mechanically taken, normal cuff: 153/89, HR 74 SpO2 100 %, room air  Cervical rotation AROM Shoulder flexion AROM  Manually resisted shoulder flexion, and lower trap  Reviewed progress/current status with PT towards goals.   Seated B scapular retraction 10x2   Then with shoulder ER yellow band 5x   Increased B cervical paraspinal muscle tension palpated   With shoulder extension yellow band 5x   R scalene muscle tension palpted  Seated manually resisted scapular depression with PT manual resistance.   R 2x5 seconds    Increased symptoms. Slowly eases off.    Improved exercise technique, movement at target joints, use of target muscles after mod verbal, visual, tactile cues.    Manual therapy    Seated STM to B cervical paraspinal, L and R distal scalane and upper trap, R rhomboid muscle areas to decrease tension  Better able to tolerate manual therapy overall compared to previous sessions.  Slight decrease in headache reported  afterwards        PATIENT EDUCATION:  Education details: POC Person educated: Patient Education method: Explanation Education comprehension: verbalized understanding  HOME EXERCISE PROGRAM: Pt was recommended to press his tongue at the roof of his mouth to activate his anterior cervical muscles throughout the day. Pt demonstrated and verbalized understanding.    Access Code: [email or web address] URL: [email or web address] Date: 05/10/2024 Prepared by: Emil Glassman  Exercises - Seated Cervical Flexion AROM  - 2 x daily - 7 x weekly - 3 sets - 5 reps - Seated Transversus Abdominis Bracing  - 1 x daily - 7 x weekly - 2 -3 sets - 10  reps - 5 seconds hold - Seated Chin Tuck with Neck Elongation  - 2-3 x daily - 7 x weekly - 2-3 sets - 5 reps - Seated Cervical Sidebending AROM  - 3 x daily - 7 x weekly - 4 sets - 5 reps  ASSESSMENT:  CLINICAL IMPRESSION: PT overall making progress with decreasing pain and improving UE ROM and strength. Progress is slow secondary to complicated medical history limiting multiple interventions. Continued utilizing STM to decrease muscle tension to his neck which pt seems to better tolerate compared to previous sessions. Worked on limited scapular and shoulder strengthening today secondary to increased muscle tension to his neck. Fair tolerance to today's session.  Pt will benefit from continued skilled physical therapy services to decrease pain, improve strength and function.     OBJECTIVE IMPAIRMENTS: Abnormal gait, decreased balance, decreased endurance, difficulty walking, decreased ROM, decreased strength, dizziness, impaired UE functional use, improper body mechanics, postural dysfunction, and pain.   ACTIVITY LIMITATIONS: carrying, lifting, bending, sitting, standing, squatting, sleeping, stairs, transfers, bed mobility, bathing, toileting, dressing, reach over head, hygiene/grooming, locomotion level, and caring for others  PARTICIPATION  LIMITATIONS:   PERSONAL FACTORS: Age, Fitness, Past/current experiences, Time since onset of injury/illness/exacerbation, and 3+ comorbidities: DVT, depression, dyspnea, heart attack, HTN, osteoporosis, are also affecting patient's functional outcome.   REHAB POTENTIAL: Fair    CLINICAL DECISION MAKING: Stable/uncomplicated  EVALUATION COMPLEXITY: Low   GOALS: Goals reviewed with patient? Yes  SHORT TERM GOALS: Target date: 05/13/2024  Pt will be independent with his initial HEP to improve strength, decrease pain, improve ability to lift, look around as well as ambulate and perform standing tasks with less difficulty and pain. Baseline: Pt has not yet officially started his initial HEP (05/02/2024) Goal status: ACHIEVED   LONG TERM GOALS: Target date: 07/01/2024  Pt will have a decrease in neck pain to 5/10 or less at worst to promote ability to look around more comfortably.  Baseline: 9/10 neck pain at worst for the past 3 months (05/02/2024); 6/10 at most for 7 days (06/02/2024); 06/14/24: 7-8/10 ; 6/10 at most for the past 7 days (06/27/2024); 6/10 on average for the past 7 days, 7/10 at most for the past 7 days, pt taking hydrocodone  again (07/28/2024) Goal status: ONGOING  2.  Pt will have a decrease on low back pain to 4/10 or less at worst to promote ability to tolerate sitting, standing, walking and performing standing tasks more comfortably for his back.  Baseline: 8/10 low back pain at worst for past 7 days; 6/10 worst pain in past week 06/14/24; 7/10 at most for the past 7 days (06/27/2024); 6/10 back pain at most for the past 7 days (07/28/2024) Goal status: PROGRESSING  3.  Pt will improve his Neck Disability Index (NDI) score by at least 20% as a demonstration of improved function.  Baseline: Neck Disability Index score: 41/50 (82%) (05/02/2024); 06/14/24: 58% NDI Goal status: ACHIEVED   4.  Pt will improve his cervical rotation AROM to 50 degrees or more to promote ability to look  around more comfortably. Baseline:  Active ROM A/PROM (deg) eval AROM (06/02/2024) AROM (07/28/2024)  Right rotation 20 degrees with pain 30 degrees with pain 28 with pain  Left rotation 22 degrees with pain 35 degrees with pain 23 with pain   (05/02/2024) Goal status: ONGOING  5.  Pt will improve B shoulder flexion AROM to 100 degrees or more to promote ability to reach as well as lift more comfortably.  Baseline:  Active ROM Right eval Left eval R (06/02/2024) L (06/02/2024) R (06/27/2024) L  (06/27/2024) R (07/28/2024) L (07/28/2024)  Shoulder flexion 55 (scaption) 55 (scaption) 44  (Scaption) 65  (Scaption) 56 degrees with R upper trap and lateral shoulder and arm pain to elbow.  83 degrees with anterior shoulder and arm pain 62 with pain 85 with pain at end range   (05/02/2024)  Goal status: PROGRESSING  6.  Pt will improve his shoulder flexion and lower trap strength by at least 1/2 MMT grade to promote ability to raise his arms as well as lift with less difficulty.  Baseline:  MMT Right eval Left eval R (06/27/2024) L (06/27/2024) R (07/28/2024) L (07/28/2024)  Shoulder flexion 3+ with neck and shoulder and arm pain 4- with pain at available range.  4-/5 manual resistance at available range with R UE pain to hand 4/5 manual resistance at available range 4-/5 manual resistance at available range with  pain  4/5 manual resistance at available range  Lower trapezius (seated manually resisted) 3+ with neck pain 3+ with neck pain 3+ 4 3+ with pain 4 with pain   (05/02/2024) Goal status: IN PROGRESS   PLAN:  PT FREQUENCY: 1-2x/week  PT DURATION: 8 weeks  PLANNED INTERVENTIONS: 97110-Therapeutic exercises, 97530- Therapeutic activity, [id number]- Neuromuscular re-education, 97535- Self Care, [id number]- Manual therapy, [id number]- Gait training, [phone]- Electrical stimulation (unattended), 02966- Ionotophoresis 4mg /ml Dexamethasone , Patient/Family education, and Balance training.  PLAN FOR NEXT  SESSION: posture, B scapular, anterior cervical, trunk and glute strengthening, mechanics, manual techniques, modalities PRN  Thank you for your referral.   Hajime Asfaw, PT, DPT 07/28/2024, 9:22 AM

## 2024-07-28 NOTE — Assessment & Plan Note (Signed)
    07/07/2024    9:49 AM 07/06/2024   10:02 AM 01/07/2024   11:08 AM  Depression screen PHQ 2/9  Decreased Interest 2 3 1   Down, Depressed, Hopeless 2 2 1   PHQ - 2 Score 4 5 2   Altered sleeping  3 1  Tired, decreased energy  3 3  Change in appetite  1 0  Feeling bad or failure about yourself   2 0  Trouble concentrating  1 0  Moving slowly or fidgety/restless  2 0  Suicidal thoughts  0 0  PHQ-9 Score  17 6  Difficult doing work/chores  Not difficult at all       03/28/2024   10:50 AM 07/07/2023   10:01 AM 06/23/2023    9:17 AM 04/24/2023    8:16 AM  GAD 7 : Generalized Anxiety Score  Nervous, Anxious, on Edge 0 3 3 3   Control/stop worrying 0 3 3 3   Worry too much - different things 0 3 3 3   Trouble relaxing 0 3 3 3   Restless 0 3 3 3   Easily annoyed or irritable 0 3 3 3   Afraid - awful might happen 0 3 3 3   Total GAD 7 Score 0 21 21 21   Anxiety Difficulty Not difficult at all Very difficult Very difficult Extremely difficult    Depressive sx better than they have been in the past, anxiety and irritability come in waves - he did start getting management from psych Depression and anxiety Ongoing depression and anxiety with fluctuating severity. Managed with Zoloft  and Abilify . Recent irritability possibly related to Benlysta or hydrocodone . Receiving mental health support through psychiatry. - Continue current psychiatric medications, could consider zoloft  dose titration or PRN anxiety meds - concern with irritability and increased anxiety last week - he plans to ask rheumatologist potential side effects of new lupus medication (Benlysta). - Maintain mental health support through psychiatry department at the hospital.

## 2024-07-28 NOTE — Assessment & Plan Note (Signed)
 Per nephrology, monitoring eGFR

## 2024-07-28 NOTE — Assessment & Plan Note (Signed)
 Lab Results  Component Value Date   HGBA1C 6.0 (H) 10/28/2023   Recheck labs with LE neuropathy pt is worried about developing T2DM, no other associated or concerning sx

## 2024-07-28 NOTE — Progress Notes (Signed)
 Name: Jonathon Snow   MRN: [id number]    DOB: 28-Apr-1961   Date:07/28/2024       Progress Note  Chief Complaint  Patient presents with   Diabetes    Screening for DM    Subjective:   Jonathon Snow is a 63 y.o. male, presents to clinic for routine follow up on chronic conditions  Neuropathy feet NMK pain neurology and spine - worse at rest and at bedtime He is worreid about DM wants labs rechecked, hx of prediabetes Lab Results  Component Value Date   HGBA1C 6.0 (H) 10/28/2023   Hx of b12 deficiency, autoimmune disease  Discussed the use of AI scribe software for clinical note transcription with the patient, who gave verbal consent to proceed.  History of Present Illness Jonathon Snow is a 63 year old male with neuropathy and recurrent DVT who presents with worsening nerve pain.  Neuropathic pain - Sharp, stinging nerve pain localized to the upper back, both feet, and legs - Pain is most prominent at rest and during bedtime, especially when lying down - Chronic skin changes with stinging sensation when skin is rubbed - Skin swelling has improved compared to previous visits - Gabapentin  300 mg three times daily provides insufficient relief - Recently started hydrocodone  with minimal improvement in nerve pain  Vitamin b12 deficiency management - Takes B12 supplements and has received B12 injections for previously low B12 levels - Neurology follow-up is scheduled  Mood disturbances/MDD/ and more anxiety - seeing psych - Takes Zoloft  and Abilify  for mood stabilization - Experiences fluctuations in anxiety and mood - Increased irritability last week he wonders if its related to new rheumatology meds  Systemic lupus erythematosus management - Under rheumatology care for lupus, previously managed by nephrology only - Recently started [facility]     Current Outpatient Medications:    apixaban  (ELIQUIS ) 5 MG TABS tablet, Take 1 tablet (5 mg total) by mouth 2 (two) times  daily., Disp: 180 tablet, Rfl: 1   ARIPiprazole  (ABILIFY ) 2 MG tablet, Take 4 mg by mouth daily., Disp: , Rfl:    azelastine (ASTELIN) 0.1 % nasal spray, 2 sprays 2 (two) times daily., Disp: , Rfl:    BENLYSTA 200 MG/ML SOSY, , Disp: , Rfl:    carbidopa-levodopa (SINEMET IR) 25-100 MG tablet, Take by mouth., Disp: , Rfl:    cholecalciferol  (VITAMIN D3) 25 MCG (1000 UNIT) tablet, Take 1,000 Units by mouth daily., Disp: , Rfl:    clonazePAM  (KLONOPIN ) 0.5 MG tablet, Take 0.5 mg by mouth daily., Disp: , Rfl:    clopidogrel  (PLAVIX ) 75 MG tablet, TAKE 1 TABLET(75 MG) BY MOUTH DAILY, Disp: 90 tablet, Rfl: 0   cyanocobalamin  (VITAMIN B12) 1000 MCG/ML injection, Vit B12 1000 mcg IM once a week for 3 weeks then once a month for 4 months. During and after loading dose with injection treatment patient should also take 1000 mcg by mouth once a day., Disp: , Rfl:    famotidine  (PEPCID ) 20 MG tablet, Take 1 tablet (20 mg total) by mouth 2 (two) times daily., Disp: 60 tablet, Rfl: 0   fluticasone  (FLONASE ) 50 MCG/ACT nasal spray, Place 2 sprays into both nostrils daily., Disp: 16 g, Rfl: 6   gabapentin  (NEURONTIN ) 300 MG capsule, Take 1 capsule (300 mg total) by mouth 2 (two) times daily., Disp: 60 capsule, Rfl: 2   hydrALAZINE  (APRESOLINE ) 25 MG tablet, Take 1 tablet (25 mg total) by mouth 3 (three) times daily., Disp: 270 tablet,  Rfl: 1   HYDROcodone -acetaminophen  (NORCO/VICODIN) 5-325 MG tablet, Take 1-2 tablets by mouth every 12 (twelve) hours as needed for severe pain (pain score 7-10). Must last 30 days, Disp: 60 tablet, Rfl: 0   hydroxychloroquine (PLAQUENIL) 200 MG tablet, Take 200 mg by mouth 2 (two) times daily., Disp: , Rfl:    hydrOXYzine  (ATARAX ) 25 MG tablet, Take 25-50 mg by mouth at bedtime as needed., Disp: , Rfl:    loratadine  (CLARITIN ) 10 MG tablet, Take 10 mg by mouth daily., Disp: , Rfl:    losartan  (COZAAR ) 25 MG tablet, Take 25 mg by mouth daily., Disp: , Rfl:    meclizine  (ANTIVERT ) 12.5  MG tablet, Take 1 tablet (12.5 mg total) by mouth 3 (three) times daily as needed for dizziness., Disp: 30 tablet, Rfl: 0   metoprolol  succinate (TOPROL -XL) 25 MG 24 hr tablet, Take 1 tablet (25 mg total) by mouth daily., Disp: 90 tablet, Rfl: 3   midodrine  (PROAMATINE ) 10 MG tablet, Take 10 mg by mouth 3 (three) times daily., Disp: , Rfl:    montelukast  (SINGULAIR ) 10 MG tablet, Take 1 tablet (10 mg total) by mouth at bedtime., Disp: 90 tablet, Rfl: 1   Multiple Vitamins-Minerals (MULTIVITAMIN WITH MINERALS) tablet, Take 1 tablet by mouth daily., Disp: , Rfl:    mycophenolate  (CELLCEPT ) 500 MG tablet, Take 2 tablets (1,000 mg total) by mouth 2 (two) times daily. HOLD until neurosurgery is ok to restart this (waiting for incision to heal) (Patient taking differently: Take 500 mg by mouth 2 (two) times daily.), Disp: , Rfl:    oxybutynin  (DITROPAN  XL) 15 MG 24 hr tablet, Take 15 mg by mouth daily., Disp: , Rfl:    pantoprazole  (PROTONIX ) 20 MG tablet, Take 1 tablet (20 mg total) by mouth 2 (two) times daily., Disp: 180 tablet, Rfl: 3   rosuvastatin  (CRESTOR ) 20 MG tablet, Take 1 tablet (20 mg total) by mouth daily., Disp: 90 tablet, Rfl: 1   sertraline  (ZOLOFT ) 100 MG tablet, Take 1.5 tablets (150 mg total) by mouth daily., Disp: 135 tablet, Rfl: 1   solifenacin  (VESICARE ) 5 MG tablet, Take 5 mg by mouth daily., Disp: , Rfl:    Syringe/Needle, Disp, (SYRINGE 3CC/25GX1) 25G X 1 3 ML MISC, To be used with Vit B12 1000 mcg IM once a week for 4 weeks then once a month for 4 months. During and after loading dose with injection treatment patient should also take 1000 mcg by mouth once a day., Disp: , Rfl:    tadalafil  (CIALIS ) 20 MG tablet, Take 1 tablet (20 mg total) by mouth daily as needed for erectile dysfunction. Avoid taking it if low blood pressure, Disp: , Rfl:    tamsulosin  (FLOMAX ) 0.4 MG CAPS capsule, Take 1 capsule (0.4 mg total) by mouth daily., Disp: 30 capsule, Rfl: 11   traMADol  (ULTRAM ) 50  MG tablet, SMARTSIG:1 Tablet(s) By Mouth Every 12 Hours, Disp: , Rfl:   Patient Active Problem List   Diagnosis Date Noted   Cervical radicular pain 05/26/2024   History of thoracic spinal fusion (T7-T12) 05/26/2024   History of lumbar spinal fusion (L5-S1) 05/26/2024   Chronic pain syndrome 05/26/2024   Anxiety and depression 02/26/2024   Dyslipidemia 02/26/2024   Peripheral neuropathy 02/26/2024   Essential hypertension 02/26/2024   Vertigo 02/26/2024   Syncope 02/25/2024   Orthostatic hypotension 02/11/2024   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) 01/20/2024   Anxiety 12/14/2023   Overweight (BMI 25.0-29.9) 11/23/2023  Chronic heart failure with preserved ejection fraction (HFpEF) (HCC) 11/23/2023   BPH (benign prostatic hyperplasia) 11/23/2023   Unstable angina (HCC) 11/10/2023   Closed T10 spinal fracture (HCC) 11/06/2023   Closed tricolumnar fracture of thoracic vertebra (HCC) 11/06/2023   Thoracic spine instability 11/06/2023   Chronic bilateral low back pain with left-sided sciatica 07/07/2023   Neuropathy 07/07/2023   Hx of colonic polyps 07/01/2023   Adenomatous polyp of colon 07/01/2023   Erectile disorder 01/22/2023   Coronary artery disease involving native coronary artery of native heart without angina pectoris 09/09/2022   Varicose 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 01/03/2022   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) 09/18/2021   Prediabetes 11/08/2020   Stage 3a chronic kidney disease (HCC) 11/08/2020   Seasonal allergies 11/08/2020   Rhinosinusitis 11/08/2020   Anticoagulant disorder (HCC) 11/07/2020   Senile purpura (HCC) 11/07/2020   MDD (major depressive disorder), recurrent episode, mild (HCC) 09/12/2019   Other spondylosis with radiculopathy, lumbar region 02/16/2019   Osteoporosis 10/12/2018   Chronic venous insufficiency 10/07/2018   Lymphedema 10/07/2018   SLE glomerulonephritis syndrome, WHO  class V (HCC) 03/03/2018   Syncope and collapse 12/29/2017   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) 10/29/2016   Hyperlipidemia LDL goal <70 01/15/2016   Uncontrolled hypertension 01/15/2016   Obesity (BMI 30.0-34.9) 01/15/2016   Long term current use of anticoagulant 10/04/2015   Systemic lupus erythematosus (HCC) 05/30/2015   COPD, moderate (HCC) 06/27/2014   Shortness of breath 06/27/2014   History of pulmonary embolism 12/11/2013   Nodule of right lung 12/11/2013   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   Cystic disease of liver 08/21/2013    Past Surgical History:  Procedure Laterality Date   ANKLE SURGERY Right    BACK SURGERY     BRONCHIAL WASHINGS N/A 11/01/2021   Procedure: BRONCHIAL WASHINGS;  Surgeon: Parris Manna, MD;  Location: [facility] ORS;  Service: Thoracic;  Laterality: N/A;   COLONOSCOPY WITH PROPOFOL  N/A 05/28/2020   Procedure: COLONOSCOPY WITH PROPOFOL ;  Surgeon: Therisa Bi, MD;  Location: [facility] ENDOSCOPY;  Service: Endoscopy;  Laterality: N/A;   COLONOSCOPY WITH PROPOFOL  N/A 07/01/2023   Procedure: COLONOSCOPY WITH PROPOFOL ;  Surgeon: Therisa Bi, MD;  Location: [facility] ENDOSCOPY;  Service: Gastroenterology;  Laterality: N/A;   CORONARY ULTRASOUND/IVUS N/A 11/05/2023   Procedure: Coronary Ultrasound/IVUS;  Surgeon: Mady Bruckner, MD;  Location: [facility] INVASIVE CV LAB;  Service: Cardiovascular;  Laterality: N/A;   CORONARY/GRAFT ACUTE MI REVASCULARIZATION N/A 11/05/2023   Procedure: Coronary/Graft Acute MI Revascularization;  Surgeon: Mady Bruckner, MD;  Location: [facility] INVASIVE CV LAB;  Service: Cardiovascular;  Laterality: N/A;   CYST EXCISION  92 or 93    Liver cyst removal [facility]   FLEXIBLE BRONCHOSCOPY N/A 11/01/2021   Procedure: FLEXIBLE BRONCHOSCOPY;  Surgeon: Parris Manna, MD;  Location: [facility] ORS;  Service: Thoracic;  Laterality: N/A;   HIP PINNING,CANNULATED Left 02/19/2023   Procedure:  PERCUTANEOUS FIXATION OF FEMORAL NECK;  Surgeon: Tobie Priest, MD;  Location: [facility] ORS;  Service: Orthopedics;  Laterality: Left;   I & D EXTREMITY Right 04/29/2017   Procedure: IRRIGATION AND DEBRIDEMENT EXTREMITY;  Surgeon: Shelva Dunnings, MD;  Location: [facility] ORS;  Service: General;  Laterality: Right;   IRRIGATION AND DEBRIDEMENT ABSCESS Left 04/29/2017   Procedure: IRRIGATION AND DEBRIDEMENT Scrotal ABSCESS;  Surgeon: Shelva Dunnings, MD;  Location: [facility] ORS;  Service: General;  Laterality: Left;   LEFT HEART CATH AND CORONARY ANGIOGRAPHY N/A 11/05/2023  Procedure: LEFT HEART CATH AND CORONARY ANGIOGRAPHY;  Surgeon: Mady Bruckner, MD;  Location: [facility] INVASIVE CV LAB;  Service: Cardiovascular;  Laterality: N/A;   POLYPECTOMY  07/01/2023   Procedure: POLYPECTOMY;  Surgeon: Therisa Bi, MD;  Location: [facility] ENDOSCOPY;  Service: Gastroenterology;;    Family History  Problem Relation Age of Onset   Hypertension Father    Heart disease Father    Clotting disorder Mother    Kidney disease Brother    Heart attack Maternal Grandmother    Heart attack Maternal Grandfather    Heart attack Paternal Grandfather     Social History   Tobacco Use   Smoking status: Never   Smokeless tobacco: Never  Vaping Use   Vaping status: Never Used  Substance Use Topics   Alcohol use: No   Drug use: No    Comment: PT DENIES     Allergies  Allergen Reactions   Enalapril Other (See Comments)    Unknown reaction   Vicodin [Hydrocodone -Acetaminophen ] Hives and Rash    Severe headaches (also) NAME BRAND ONLY PER PT CAN TAKE GENERIC    Health Maintenance  Topic Date Due   COVID-19 Vaccine (6 - 2024-25 season) 08/05/2024 (Originally 08/23/2023)   Zoster Vaccines- Shingrix (1 of 2) 10/20/2024 (Originally 12/11/1980)   INFLUENZA VACCINE  03/21/2025 (Originally 07/22/2024)   Pneumococcal Vaccine: 19-49 Years (3 of 3 - PCV20 or PCV21) 07/20/2025 (Originally 03/19/2023)   Pneumococcal Vaccine: 50+ Years  (3 of 3 - PCV20 or PCV21) 07/28/2025 (Originally 03/19/2023)   Colonoscopy  06/30/2030   DTaP/Tdap/Td (4 - Td or Tdap) 08/09/2031   Hepatitis C Screening  Completed   HIV Screening  Completed   Hepatitis B Vaccines  Aged Out   HPV VACCINES  Aged Out   Meningococcal B Vaccine  Aged Out    Chart Review Today: I personally reviewed active problem list, medication list, allergies, family history, social history, health maintenance, notes from last encounter, lab results, imaging with the patient/caregiver today.   Review of Systems  Constitutional: Negative.   HENT: Negative.    Eyes: Negative.   Respiratory: Negative.    Cardiovascular: Negative.   Gastrointestinal: Negative.   Endocrine: Negative.   Genitourinary: Negative.   Musculoskeletal: Negative.   Skin: Negative.   Allergic/Immunologic: Negative.   Neurological: Negative.   Hematological: Negative.   Psychiatric/Behavioral: Negative.    All other systems reviewed and are negative.    Objective:   Vitals:   07/28/24 1456  BP: 128/82  Pulse: 86  Resp: 16  SpO2: 100%  Weight: 215 lb (97.5 kg)  Height: 6' 2 (1.88 m)    Body mass index is 27.6 kg/m.  Physical Exam Vitals and nursing note reviewed.  Constitutional:      General: He is not in acute distress.    Appearance: Normal appearance. He is well-developed. He is not ill-appearing, toxic-appearing or diaphoretic.  HENT:     Head: Normocephalic and atraumatic.     Right Ear: External ear normal.     Left Ear: External ear normal.     Nose: Nose normal.  Eyes:     General: No scleral icterus.       Right eye: No discharge.        Left eye: No discharge.     Conjunctiva/sclera: Conjunctivae normal.  Neck:     Trachea: No tracheal deviation.  Pulmonary:     Effort: Pulmonary effort is normal. No respiratory distress.     Breath sounds: No  stridor.  Skin:    General: Skin is warm and dry.     Findings: No rash.  Neurological:     Mental Status: He  is alert.     Motor: No abnormal muscle tone.     Coordination: Coordination normal.     Gait: Gait abnormal.  Psychiatric:        Mood and Affect: Mood normal.        Behavior: Behavior normal.       Results for orders placed or performed in visit on 07/19/24  Compliance Drug Analysis, Ur   Collection Time: 07/19/24  2:07 PM  Result Value Ref Range   Summary FINAL    *Note: Due to a large number of results and/or encounters for the requested time period, some results have not been displayed. A complete set of results can be found in Results Review.   Last vitamin D  Lab Results  Component Value Date   VD25OH 35 11/15/2018   Lab Results  Component Value Date   VITAMINB12 274 10/28/2023      Assessment & Plan:   Pt presents with concern for neuropathy sx changes and concern for checking A1c.  Medically complex pt-   Neuropathy Assessment & Plan: On gabapentin  300 mg BID, not well controlled Peripheral neuropathy, bilateral lower extremities Intermittent nerve pain in feet and legs, worse at rest and bedtime. Possible causes include neuropathy, restless leg syndrome, or deficiencies, pt wishes to r/o DM also. Current treatment includes gabapentin  300 TID. He is seeing neurology, neurosurgery and pain management  - Check blood glucose levels to monitor prediabetes, assess/r/o DM which pt is concerned about - Check vitamin B12 levels to ensure adequacy of current supplementation - on oral, last lab ~200's did some injections with neurology - assess to see if PO is adequate or if B12 parenteral is needed - Check iron levels to rule out iron deficiency as a cause of nerve pain. - Consider adjusting gabapentin  or switching to Lyrica if symptoms persist. - Evaluate for restless leg syndrome medications if symptoms are primarily at night.   Possibly could adjust gabapentin , see if RLS meds PRN bedtime are doable, for variety of MSK and nerve pain SSRI/SNRI changes for pain like  cymbalta may be helpful, consider lyrica vs gabapentin   Orders: -     CBC with Differential/Platelet -     Vitamin B12 -     Iron, TIBC and Ferritin Panel  Paresthesia -     CBC with Differential/Platelet -     Vitamin B12 -     Iron, TIBC and Ferritin Panel  Prediabetes Assessment & Plan: Lab Results  Component Value Date   HGBA1C 6.0 (H) 10/28/2023   Recheck labs with LE neuropathy pt is worried about developing T2DM, no other associated or concerning sx   Orders: -     Hemoglobin A1c -     Comprehensive metabolic panel with GFR  B12 deficiency Assessment & Plan: Vitamin B12 deficiency, on treatment Previously low B12 levels treated with oral supplements and injections. Follow-up with neurology scheduled. Oral supplementation may be insufficient if symptoms persist. - Check current B12 levels to assess effectiveness of treatment. - Consider regular B12 injections if oral supplementation is insufficient. Lab Results  Component Value Date   VITAMINB12 274 10/28/2023    Orders: -     Vitamin B12  Stage 3a chronic kidney disease (HCC) Assessment & Plan: Per nephrology, monitoring eGFR  Orders: -     Comprehensive  metabolic panel with GFR  Vitamin D  deficiency Assessment & Plan: On supplement, last labs in normal range Last vitamin D  Lab Results  Component Value Date   VD25OH 35 11/15/2018     Orders: -     Comprehensive metabolic panel with GFR  Systemic lupus erythematosus, unspecified SLE type, unspecified organ involvement status (HCC) Assessment & Plan: Managed by rheumatology and nephrology, on new meds per rheumatology     Chronic pain syndrome Assessment & Plan: Per pain management and neurosurgery    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) -     Comprehensive metabolic panel with GFR -     CBC with Differential/Platelet  MDD (major depressive disorder), recurrent episode, mild (HCC) Assessment & Plan:    07/07/2024    9:49 AM  07/06/2024   10:02 AM 01/07/2024   11:08 AM  Depression screen PHQ 2/9  Decreased Interest 2 3 1   Down, Depressed, Hopeless 2 2 1   PHQ - 2 Score 4 5 2   Altered sleeping  3 1  Tired, decreased energy  3 3  Change in appetite  1 0  Feeling bad or failure about yourself   2 0  Trouble concentrating  1 0  Moving slowly or fidgety/restless  2 0  Suicidal thoughts  0 0  PHQ-9 Score  17 6  Difficult doing work/chores  Not difficult at all       03/28/2024   10:50 AM 07/07/2023   10:01 AM 06/23/2023    9:17 AM 04/24/2023    8:16 AM  GAD 7 : Generalized Anxiety Score  Nervous, Anxious, on Edge 0 3 3 3   Control/stop worrying 0 3 3 3   Worry too much - different things 0 3 3 3   Trouble relaxing 0 3 3 3   Restless 0 3 3 3   Easily annoyed or irritable 0 3 3 3   Afraid - awful might happen 0 3 3 3   Total GAD 7 Score 0 21 21 21   Anxiety Difficulty Not difficult at all Very difficult Very difficult Extremely difficult    Depressive sx better than they have been in the past, anxiety and irritability come in waves - he did start getting management from psych Depression and anxiety Ongoing depression and anxiety with fluctuating severity. Managed with Zoloft  and Abilify . Recent irritability possibly related to Benlysta or hydrocodone . Receiving mental health support through psychiatry. - Continue current psychiatric medications, could consider zoloft  dose titration or PRN anxiety meds - concern with irritability and increased anxiety last week - he plans to ask rheumatologist potential side effects of new lupus medication (Benlysta). - Maintain mental health support through psychiatry department at the hospital.   Anemia, unspecified type Assessment & Plan: Iron deficiency vs anemia of chronic disease, under evaluation Anemia since last Dec hgb 11.3-12.8. No recent iron panel performed. - Order iron panel to evaluate current iron status. Lab Results  Component Value Date   IRON 99 07/11/2014    TIBC 277 07/11/2014   FERRITIN 50 09/10/2021   Hemoglobin  Date Value Ref Range Status  02/27/2024 11.3 (L) 13.0 - 17.0 g/dL Final  96/92/7974 88.6 (L) 13.0 - 17.0 g/dL Final  96/93/7974 86.9 13.0 - 17.0 g/dL Final  97/74/7974 87.1 (L) 13.0 - 17.0 g/dL Final  87/69/7975 88.0 (L) 13.0 - 17.7 g/dL Final  87/95/7975 88.0 (L) 13.0 - 17.0 g/dL Final  89/91/7975 85.1 13.0 - 17.0 g/dL Final   HGB  Date Value Ref Range Status  04/10/2015 10.4 (  L) 13.0 - 18.0 g/dL Final  95/93/7983 9.8 (L) 13.0 - 18.0 g/dL Final  98/79/7983 89.4 (L) 13.0 - 18.0 g/dL Final  87/84/7984 9.4 (L) 13.0 - 18.0 g/dL Final    Orders: -     CBC with Differential/Platelet -     Vitamin B12 -     Iron, TIBC and Ferritin Panel     Assessment & Plan   Chronic pain, neck and upper back with radiation to arms Chronic pain in neck and upper back with radiation to shoulders and arms. Managed with gabapentin  and hydrocodone . Injections not performed due to anticoagulation. Mood changes noted, possibly related to opioid use. - Continue current pain management regimen with gabapentin  and hydrocodone . - Discuss with cardiologist the possibility of pausing anticoagulation for nerve block injections. - Monitor for mood changes potentially related to opioid use.  Chronic pain, lower extremity with skin changes post-DVT Chronic pain and skin changes in lower extremities post-DVT. Pain increases with rubbing, suggesting possible neuropathic component. Vascular specialist monitoring blood flow issues. - Consider neuropathic pain management if symptoms persist.    Recording duration: 18 minutes  Routine f/up in the next 3 months     Michelene Cower, PA-C 07/28/24 3:09 PM

## 2024-07-28 NOTE — Assessment & Plan Note (Signed)
 Per pain management and neurosurgery

## 2024-07-28 NOTE — Assessment & Plan Note (Addendum)
 On gabapentin  300 mg BID, not well controlled Peripheral neuropathy, bilateral lower extremities Intermittent nerve pain in feet and legs, worse at rest and bedtime. Possible causes include neuropathy, restless leg syndrome, or deficiencies, pt wishes to r/o DM also. Current treatment includes gabapentin  300 TID. He is seeing neurology, neurosurgery and pain management  - Check blood glucose levels to monitor prediabetes, assess/r/o DM which pt is concerned about - Check vitamin B12 levels to ensure adequacy of current supplementation - on oral, last lab ~200's did some injections with neurology - assess to see if PO is adequate or if B12 parenteral is needed - Check iron levels to rule out iron deficiency as a cause of nerve pain. - Consider adjusting gabapentin  or switching to Lyrica if symptoms persist. - Evaluate for restless leg syndrome medications if symptoms are primarily at night.   Possibly could adjust gabapentin , see if RLS meds PRN bedtime are doable, for variety of MSK and nerve pain SSRI/SNRI changes for pain like cymbalta may be helpful, consider lyrica vs gabapentin 

## 2024-07-28 NOTE — Assessment & Plan Note (Signed)
 Managed by rheumatology and nephrology, on new meds per rheumatology

## 2024-07-28 NOTE — Assessment & Plan Note (Signed)
 Iron deficiency vs anemia of chronic disease, under evaluation Anemia since last Dec hgb 11.3-12.8. No recent iron panel performed. - Order iron panel to evaluate current iron status. Lab Results  Component Value Date   IRON 99 07/11/2014   TIBC 277 07/11/2014   FERRITIN 50 09/10/2021   Hemoglobin  Date Value Ref Range Status  02/27/2024 11.3 (L) 13.0 - 17.0 g/dL Final  96/92/7974 88.6 (L) 13.0 - 17.0 g/dL Final  96/93/7974 86.9 13.0 - 17.0 g/dL Final  97/74/7974 87.1 (L) 13.0 - 17.0 g/dL Final  87/69/7975 88.0 (L) 13.0 - 17.7 g/dL Final  87/95/7975 88.0 (L) 13.0 - 17.0 g/dL Final  89/91/7975 85.1 13.0 - 17.0 g/dL Final   HGB  Date Value Ref Range Status  04/10/2015 10.4 (L) 13.0 - 18.0 g/dL Final  95/93/7983 9.8 (L) 13.0 - 18.0 g/dL Final  98/79/7983 89.4 (L) 13.0 - 18.0 g/dL Final  87/84/7984 9.4 (L) 13.0 - 18.0 g/dL Final

## 2024-07-29 ENCOUNTER — Ambulatory Visit: Payer: Self-pay | Admitting: Family Medicine

## 2024-07-29 LAB — COMPREHENSIVE METABOLIC PANEL WITH GFR
AG Ratio: 1.4 (calc) (ref 1.0–2.5)
ALT: 9 U/L (ref 9–46)
AST: 11 U/L (ref 10–35)
Albumin: 4.1 g/dL (ref 3.6–5.1)
Alkaline phosphatase (APISO): 98 U/L (ref 35–144)
BUN/Creatinine Ratio: 15 (calc) (ref 6–22)
BUN: 24 mg/dL (ref 7–25)
CO2: 23 mmol/L (ref 20–32)
Calcium: 9.2 mg/dL (ref 8.6–10.3)
Chloride: 106 mmol/L (ref 98–110)
Creat: 1.59 mg/dL — ABNORMAL HIGH (ref 0.70–1.35)
Globulin: 2.9 g/dL (ref 1.9–3.7)
Glucose, Bld: 92 mg/dL (ref 65–99)
Potassium: 4.1 mmol/L (ref 3.5–5.3)
Sodium: 137 mmol/L (ref 135–146)
Total Bilirubin: 0.6 mg/dL (ref 0.2–1.2)
Total Protein: 7 g/dL (ref 6.1–8.1)
eGFR: 49 mL/min/1.73m2 — ABNORMAL LOW (ref >=60)

## 2024-07-29 LAB — IRON,TIBC AND FERRITIN PANEL
%SAT: 15 % — ABNORMAL LOW (ref 20–48)
Ferritin: 23 ng/mL — ABNORMAL LOW (ref 24–380)
Iron: 51 ug/dL (ref 50–180)
TIBC: 345 ug/dL (ref 250–425)

## 2024-07-29 LAB — CBC WITH DIFFERENTIAL/PLATELET
Absolute Lymphocytes: 1180 {cells}/uL (ref 850–3900)
Absolute Monocytes: 564 {cells}/uL (ref 200–950)
Basophils Absolute: 19 {cells}/uL (ref 0–200)
Basophils Relative: 0.4 %
Eosinophils Absolute: 108 {cells}/uL (ref 15–500)
Eosinophils Relative: 2.3 %
HCT: 43.1 % (ref 38.5–50.0)
Hemoglobin: 13.3 g/dL (ref 13.2–17.1)
MCH: 27.2 pg (ref 27.0–33.0)
MCHC: 30.9 g/dL — ABNORMAL LOW (ref 32.0–36.0)
MCV: 88.1 fL (ref 80.0–100.0)
MPV: 11.5 fL (ref 7.5–12.5)
Monocytes Relative: 12 %
Neutro Abs: 2829 {cells}/uL (ref 1500–7800)
Neutrophils Relative %: 60.2 %
Platelets: 189 Thousand/uL (ref 140–400)
RBC: 4.89 Million/uL (ref 4.20–5.80)
RDW: 14.4 % (ref 11.0–15.0)
Total Lymphocyte: 25.1 %
WBC: 4.7 Thousand/uL (ref 3.8–10.8)

## 2024-07-29 LAB — VITAMIN B12: Vitamin B-12: 796 pg/mL (ref 200–1100)

## 2024-07-29 LAB — HEMOGLOBIN A1C
Hgb A1c MFr Bld: 5.7 % — ABNORMAL HIGH (ref <5.7)
Mean Plasma Glucose: 117 mg/dL
eAG (mmol/L): 6.5 mmol/L

## 2024-08-01 ENCOUNTER — Telehealth: Payer: Self-pay | Admitting: *Deleted

## 2024-08-01 DIAGNOSIS — J449 Chronic obstructive pulmonary disease, unspecified: Secondary | ICD-10-CM

## 2024-08-01 DIAGNOSIS — K219 Gastro-esophageal reflux disease without esophagitis: Secondary | ICD-10-CM | POA: Diagnosis not present

## 2024-08-01 DIAGNOSIS — I2510 Atherosclerotic heart disease of native coronary artery without angina pectoris: Secondary | ICD-10-CM

## 2024-08-01 DIAGNOSIS — I5032 Chronic diastolic (congestive) heart failure: Secondary | ICD-10-CM

## 2024-08-01 NOTE — Progress Notes (Signed)
 Complex Care Management Note Care Guide Note  08/01/2024 Name: Jonathon Snow MRN: [id number] DOB: 1961/05/10   Complex Care Management Outreach Attempts: An unsuccessful telephone outreach was attempted today to offer the patient information about available complex care management services.  Follow Up Plan:  Additional outreach attempts will be made to offer the patient complex care management information and services.   Encounter Outcome:  No Answer  Harlene Satterfield  [facility]  [facility], [facility] Guide  Direct Dial: [phone]  Fax [phone]

## 2024-08-01 NOTE — Progress Notes (Signed)
 Ruel Kung MD, MRCP(U.K) [address]  [place], [name] [address]  Main: [phone] Fax: [phone]   Gastroenterology Consultation  Referring Provider:     Kung Ruel, MD Primary Care Physician:  [name], PA Primary Gastroenterologist:  Dr. Ruel Kung  Reason for Consultation:     Transfer of care from [place] [facility]        HPI:  Jonathon Snow is a 63 y.o. y/o male referred for consultation & management  by [name], PA.     He says he is here to see me today for acid reflux.  He says that it all began a few months back when he gained some weight.  It was initially occurring all the time and he describes the symptoms of heartburn chest discomfort worse when he lays flat at night.  Was commenced on a PPI as well as famotidine  at nighttime which has resolved his symptoms to a great degree and only has occasional symptoms.  He denies any family history of esophageal cancer no prior endoscopy no dysphagia or any other red flag signs.  Past Medical History:  Diagnosis Date  . Allergic rhinitis   . Anesthesia complication    during kidney biopsy - hypotensive response  . Cerebral venous sinus thrombosis (HHS-HCC)   . COPD (chronic obstructive pulmonary disease) (CMS/HHS-HCC)   . History of cytopenia   . Hypercholesterolemia   . Hyperlipidemia   . Hypertension   . Lupus (systemic lupus erythematosus) (CMS/HHS-HCC)   . Migraines   . Nephrolithiasis   . Presence of IVC filter   . Proteinuria   . Pulmonary embolism (CMS/HHS-HCC)   . Ruptured disk     Past Surgical History:  Procedure Laterality Date  . COLONOSCOPY  11/09/2009   Dr. EMERSON Mariner @ [facility] - Internal Hemorrhoids.  . EGD  03/15/2014   Dr. EMERSON Manes @ [facility]  . Percutaneous pinning of Left femoral neck fracture Left 02/19/2023   Dr. Tobie  . BIOPSY KIDNEY OPEN    . IVC filter placement    . Liver cyst removed    . orif left ankle    . Wisdom teeth removed      Prior to Admission  medications  Medication Sig Taking? Last Dose  albuterol  90 mcg/actuation inhaler Inhale 2 inhalations into the lungs every 4 (four) hours as needed for Wheezing or Shortness of Breath Yes PRN Not Currently Taking  ALPRAZolam  (XANAX ) 0.5 MG tablet TAKE 1 TABLET 1 HOUR BEFORE PROCEDURE THEN TAKE THE SECOND TABLET ON ARRIVAL Yes PRN Not Currently Taking  amLODIPine  (NORVASC ) 10 MG tablet Take by mouth at bedtime Yes Taking  apixaban  (ELIQUIS ) 5 mg tablet Take 1 tablet (5 mg total) by mouth every 12 (twelve) hours Yes Taking  azelastine (ASTELIN) 137 mcg nasal spray at bedtime as needed Yes Taking  BENLYSTA 200 mg/mL Syrg  Yes Taking  ciclopirox (PENLAC) 8 % topical nail solution Apply topically at bedtime Apply over nail and surrounding skin. Yes PRN Not Currently Taking  clonazePAM  (KLONOPIN ) 0.5 MG tablet Take 0.5 mg by mouth 2 (two) times daily Yes Taking  clopidogreL  (PLAVIX ) 75 mg tablet Take 75 mg by mouth once daily Yes Taking  diphenhydrAMINE  (BENADRYL ) 25 mg capsule  Yes PRN Not Currently Taking  fluticasone  propionate (FLONASE ) 50 mcg/actuation nasal spray Place 1 spray into both nostrils 2 (two) times daily Yes Taking  gabapentin  (NEURONTIN ) 300 MG capsule Take 300 mg by mouth 2 (two) times daily Yes  hydroxychloroquine (PLAQUENIL) 200 mg tablet Take 1 tablet (200 mg total) by mouth 2 (two) times daily Yes Taking  LORazepam  (ATIVAN ) 0.5 MG tablet Take 0.5 mg by mouth 2 (two) times daily as needed Yes Taking  losartan  (COZAAR ) 100 MG tablet TAKE 1 TABLET BY MOUTH ONCE A DAY Yes Taking  methocarbamoL  (ROBAXIN ) 500 MG tablet Take 500 mg by mouth every 6 (six) hours as needed Yes Taking  multivitamin with minerals tablet Take 1 tablet by mouth once daily    Yes Taking  mycophenolate  (CELLCEPT ) 500 mg tablet Take 1,000 mg by mouth 2 (two) times daily.   Yes Taking  ondansetron  (ZOFRAN ) 4 MG tablet TAKE 1 TABLET BY MOUTH EVEYR 6 HOURS AS NEEDED FOR NAUSEA Yes Taking  oxyBUTYnin  (DITROPAN   XL) 15 MG XL tablet  Yes Taking  pantoprazole  (PROTONIX ) 20 MG DR tablet  Yes Taking  pregabalin (LYRICA) 50 MG capsule Take 50 mg by mouth at bedtime Yes Taking  rosuvastatin  (CRESTOR ) 20 MG tablet Take by mouth Yes Taking  sertraline  (ZOLOFT ) 25 MG tablet Take 1 tablet by mouth once daily Yes Taking  sildenafil  (REVATIO ) 20 mg tablet TAKE 3 TO 5 TABLETS BY MOUTH AS NEEDED 30 MIN PRIOR TO INTERCOURSE Yes Taking  tamsulosin  (FLOMAX ) 0.4 mg capsule  Yes Taking  ARIPiprazole  (ABILIFY ) 2 MG tablet   Not Taking  budesonide  (PULMICORT ) 0.5 mg/2 mL nebulizer solution Take 2 mLs (0.5 mg total) by nebulization 2 (two) times daily Patient taking differently: Take 0.5 mg by nebulization 2 (two) times daily as needed    carbidopa-levodopa (SINEMET) 25-100 mg tablet Take 0.5 tablets by mouth 3 (three) times daily for 3 days, THEN 1 tablet 3 (three) times daily for 30 days.    clobetasoL  (CORMAX ) 0.05 % external solution   Not Taking  cromolyn  (CROLOM ) 4 % ophthalmic solution INSTILL 1 DROP IN BOTH EYES FOUR TIMES DAILY AS NEEDED FOR ITCHING OR ALLERGIES    cyanocobalamin  (VITAMIN B12) 1,000 mcg/mL injection Vit B12 1000 mcg IM once a week for 3 weeks then once a month for 4 months. During and after loading dose with injection treatment patient should also take 1000 mcg by mouth once a day. Patient not taking: Reported on 08/01/2024  Not Taking  doxycycline  (VIBRAMYCIN ) 100 MG capsule Take 100 mg by mouth every 12 (twelve) hours Patient not taking: Reported on 08/01/2024  Not Taking  famotidine  (PEPCID ) 20 MG tablet Take 20 mg by mouth 2 (two) times daily Patient not taking: Reported on 08/01/2024  Not Taking  hydrOXYzine  (ATARAX ) 25 MG tablet Take 25 mg by mouth 2 (two) times daily Patient not taking: Reported on 08/01/2024  Not Taking  ipratropium-albuteroL  (DUO-NEB) nebulizer solution Take 3 mLs by nebulization 2 (two) times daily for 360 days Patient taking differently: Take 3 mLs by nebulization 2 (two)  times daily as needed    montelukast  (SINGULAIR ) 10 mg tablet Take 1 tablet (10 mg total) by mouth nightly    syringe with needle 3 mL 25 gauge x 1 Syrg To be used with Vit B12 1000 mcg IM once a week for 4 weeks then once a month for 4 months. During and after loading dose with injection treatment patient should also take 1000 mcg by mouth once a day. Patient not taking: Reported on 08/01/2024  Not Taking    Family History  Problem Relation Name Age of Onset  . Coronary Artery Disease (Blocked arteries around heart) Father    . Myocardial Infarction (Heart  attack) Father    . Kidney disease Brother    . High blood pressure (Hypertension) Mother    . Hyperlipidemia (Elevated cholesterol) Mother    . Colon cancer Neg Hx    . Colon polyps Neg Hx    . Anesthesia problems Neg Hx       Social History   Tobacco Use  . Smoking status: Never    Passive exposure: Never  . Smokeless tobacco: Never  . Tobacco comments:    around second hand smoke  Vaping Use  . Vaping status: Never Used  Substance Use Topics  . Alcohol use: No    Alcohol/week: 0.0 standard drinks of alcohol  . Drug use: No    Allergies as of 08/01/2024 - Reviewed 08/01/2024  Allergen Reaction Noted  . Enalapril Other (See Comments) 07/31/2015    Review of Systems:    All systems reviewed and negative except where noted in HPI.   Physical Exam: BP 128/88   Pulse 78   Temp 36.1 C (97 F)   Ht 188 cm (6' 2)   Wt (!) 101.2 kg (223 lb 3.2 oz)   BMI 28.66 kg/m  No LMP for male patient. Psych:  Alert and cooperative. Normal mood and affect. General:   Alert,  Well-developed, well-nourished, pleasant and cooperative in NAD Head:  Normocephalic and atraumatic. Eyes:  Sclera clear, no icterus.   Conjunctiva pink. Ears:  Normal auditory acuity. Neurologic:  Alert and oriented x3;  grossly normal neurologically. Psych:  Alert and cooperative. Normal mood and affect.  Imaging Studies: X-ray shoulder complete  left minimum 2 views Result Date: 07/15/2024 EXAM: Left Shoulder Radiographs - 3 views (Grashey, Axillary, Scapular Y) performed 07/15/2024  CLINICAL INFORMATION: Chronic left shoulder pain  COMPARISON: None  FINDINGS:  Type I-II acromion.  Mild AC joint arthritis with osteophyte formation.  Mild-moderate glenohumeral arthritis with joint space narrowing, osteophyte formation, subchondral sclerosis.  Some minor elevation of the humeral head within the glenoid could represent rotator cuff tear arthropathy.  The distal clavicle, scapula, and proximal humerus are intact.  Acromioclavicular joint alignment appears normal.  No fractures or dislocations.  No soft tissue swelling or joint effusion.  No destructive bony lesion is identified.   X-ray shoulder complete right minimum 2 views Result Date: 07/15/2024 EXAM: Right Shoulder Radiographs - 3 views (Grashey, Axillary, Scapular Y) performed 07/15/2024  CLINICAL INFORMATION: Chronic right shoulder pain  COMPARISON: None  FINDINGS:  Type I-II acromion.  Mild-moderate AC joint arthritis with joint space narrowing, osteophyte formation.  Mild-moderate glenohumeral arthritis change with joint space narrowing, osteophyte formation, subchondral sclerosis.  There is some elevation of the humeral head within the glenoid and decreased subacromial space which would indicate rotator cuff tear arthropathy.  The distal clavicle, scapula, and proximal humerus are intact.  Acromioclavicular joint alignment appears normal.  No fractures or dislocations.  No soft tissue swelling or joint effusion.  No destructive bony lesion is identified.    Assessment and Plan:  Jonathon Snow is a 64 y.o. y/o male has been referred for GERD.  Ongoing for the past few months no red flag signs.  Correlated with recent weight gain.  Plan   GERD : Counseled on life style changes, suggest to use PPI first thing in the morning on empty stomach and eat 30 minutes after. Advised on the use of a  wedge pillow at night , avoid meals for 2 hours prior to bed time. Weight loss .Discussed the risks and benefits  of long term PPI use and I explained to him that we will reassess him in 6 to 8 months time once he adopt lifestyle changes loses weight I probably will be able to get him off his PPI and if needed just place him on famotidine .   Follow up in 6 to 8 months  Dr Ruel Kung MD,MRCP(U.K)

## 2024-08-01 NOTE — Progress Notes (Signed)
 Complex Care Management Note  Care Guide Note 08/01/2024 Name: Jonathon Snow MRN: [id number] DOB: Nov 29, 1961  Jonathon Snow is a 63 y.o. year old male who sees [name], PA-C for primary care. I reached out to Jonathon Snow by phone today to offer complex care management services.  Jonathon Snow was given information about Complex Care Management services today including:   The Complex Care Management services include support from the care team which includes your Nurse Care Manager, Clinical Social Worker, or Pharmacist.  The Complex Care Management team is here to help remove barriers to the health concerns and goals most important to you. Complex Care Management services are voluntary, and the patient may decline or stop services at any time by request to their care team member.   Complex Care Management Consent Status: Patient agreed to services and verbal consent obtained.   Follow up plan:  Telephone appointment with complex care management team member scheduled for:  08/03/24  Encounter Outcome:  Patient Scheduled  Harlene Satterfield  [facility]  [facility], [facility] Guide  Direct Dial: [phone]  Fax [phone]

## 2024-08-03 ENCOUNTER — Other Ambulatory Visit: Payer: Self-pay

## 2024-08-03 NOTE — Patient Instructions (Signed)
 Visit Information  [name] was given information about Medicaid Managed Care team care coordination services as a part of their Healthy [facility] Medicaid benefit. Jonathon Snow   If you would like to schedule transportation through your Healthy [facility] plan, please call the following number at least 2 days in advance of your appointment: [phone]  For information about your ride after you set it up, call Ride Assist at [phone]. Use this number to activate a Will Call pickup, or if your transportation is late for a scheduled pickup. Use this number, too, if you need to make a change or cancel a previously scheduled reservation.  If you need transportation services right away, call [phone]. The after-hours call center is staffed 24 hours to handle ride assistance and urgent reservation requests (including discharges) 365 days a year. Urgent trips include sick visits, hospital discharge requests and life-sustaining treatment.  Call the [facility] Line at [phone], at any time, 24 hours a day, 7 days a week. If you are in danger or need immediate medical attention call 911.   [name] following are the goals we discussed in your visit today:   Goals Addressed             This Visit's Progress    VBCI RN Care Plan   On track    Problems:  Chronic Disease Management support and education needs related to CHF  Goal: Over the next 90 days the Patient will attend all scheduled medical appointments: with providers as evidenced by adherence to scheduled appointments         continue to work with RN Care Manager and/or Social Worker to address care management and care coordination needs related to CHF as evidenced by adherence to care management team scheduled appointments     take all medications exactly as prescribed and will call provider for medication related questions as evidenced by communication with provider for medication related questions      verbalize basic understanding of CHF disease process and self health management plan as evidenced by describing CHF in their own words and identifying when to seek medical attention   Interventions:   Heart Failure Interventions: Provided education on low sodium diet Assessed need for readable accurate scales in home Provided education about placing scale on hard, flat surface Advised patient to weigh each morning after emptying bladder Discussed importance of daily weight and advised patient to weigh and record daily Reviewed role of diuretics in prevention of fluid overload and management of heart failure; Discussed the importance of keeping all appointments with provider Provided patient with education about the role of exercise in the management of heart failure Screening for signs and symptoms of depression related to chronic disease state  Assessed social determinant of health barriers   Patient Self-Care Activities:  Attend all scheduled provider appointments Call pharmacy for medication refills 3-7 days in advance of running out of medications Call provider office for new concerns or questions  Notify RN Care Manager of TOC call rescheduling needs Take medications as prescribed   call office if I gain more than 2 pounds in one day or 5 pounds in one week do ankle pumps when sitting keep legs up while sitting use salt in moderation watch for swelling in feet, ankles and legs every day weigh myself daily begin a heart failure diary bring diary to all appointments develop a rescue plan eat more whole grains, fruits and vegetables, lean meats and healthy fats track  symptoms and what helps feel better or worse  Plan:  Telephone follow up appointment with care management team member scheduled for:  08/24/2024 at 1:00           VBCI RN Care Plan   On track    Problems:  Chronic Disease Management support and education needs related to COPD  Goal: Over the next 90 days  the Patient will attend all scheduled medical appointments: with all providers  as evidenced by adherence to scheduled appointments         continue to work with RN Care Manager and/or Social Worker to address care management and care coordination needs related to COPD as evidenced by adherence to care management team scheduled appointments     demonstrate a decrease COPD in exacerbations as evidenced by decreased shortness of breath and improved exercise tolerance  demonstrate understanding of rationale for each prescribed medication as evidenced by verbalizing the purpose and expected benefits of the medication     take all medications exactly as prescribed and will call provider for medication related questions as evidenced by contacting provider for any medication questions or concerns     verbalize basic understanding of COPD disease process and self health management plan as evidenced by medication adherence, avoiding triggers and when to seek emergency care   Interventions:   COPD Interventions: Advised patient to engage in light exercise as tolerated 3-5 days a week to aid in the the management of COPD Advised patient to track and manage COPD triggers Advised patient to self assesses COPD action plan zone and make appointment with provider if in the yellow zone for 48 hours without improvement Assessed social determinant of health barriers Discussed the importance of adequate rest and management of fatigue with COPD Provided education about and advised patient to utilize infection prevention strategies to reduce risk of respiratory infection Provided instruction about proper use of medications used for management of COPD including inhalers Provided patient with basic written and verbal COPD education on self care/management/and exacerbation prevention Screening for signs and symptoms of depression related to chronic disease state   Patient Self-Care Activities:  Attend all scheduled  provider appointments Call pharmacy for medication refills 3-7 days in advance of running out of medications Call provider office for new concerns or questions  Notify RN Care Manager of TOC call rescheduling needs Take medications as prescribed   identify and remove indoor air pollutants limit outdoor activity during cold weather do breathing exercises every day begin a symptom diary develop a rescue plan eliminate symptom triggers at home follow rescue plan if symptoms flare-up keep follow-up appointments: PCP 08-24-2024 at 10:20 am  get at least 7 to 8 hours of sleep at night use devices that will help like a cane, sock-puller or reacher practice relaxation or meditation daily do exercises in a comfortable position that makes breathing as easy as possible  Plan:  Telephone follow up appointment with care management team member scheduled for:  08-24-2024 1:00 PM              Please see education materials related to CHF and COPD provided by MyChart link.  Patient verbalizes understanding of instructions and care plan provided today and agrees to view in MyChart. Active MyChart status and patient understanding of how to access instructions and care plan via MyChart confirmed with patient.     Next PCP appointment:  08-24-2024 with Dr Hester at 8:30 AM and 08-24-2024 with Dr Leavy at 10:20 AM.  Telephone  follow up appointment with Managed Medicaid care management team member scheduled for: 08-24-2024 at 1:00 PM   [name] RN, BSN  [place] VBCI-Population Health RN Case Manager   Direct [phone]   Following is a copy of your plan of care:  There are no care plans that you recently modified to display for this patient.

## 2024-08-03 NOTE — Patient Outreach (Signed)
 Complex Care Management   Visit Note  08/03/2024  Name:  Jonathon Snow MRN: [id number] DOB: [birth year]  Situation: Referral received for Complex Care Management related to Heart Failure and COPD I obtained verbal consent from Patient.  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   Overview:  superior sagittal sinus, left transverse sinus and cortical veins    Closed left hip fracture, initial encounter (HCC) 02/18/2023   COVID-19 virus infection 12/2020   Depression    DVT (deep venous thrombosis) (HCC)    Dyspnea    GERD (gastroesophageal reflux disease)    Head injury 11/05/2023   Heart attack (HCC) 11/05/2023   Heel spur, left 02/16/2019   Heel spur, right 02/16/2019   Hematoma of groin 11/08/2023   Herpes zoster infection of lumbar region 02/20/2020   History of DVT (deep vein thrombosis) 03/22/2020   History of kidney stones    Hyperlipidemia    Hypertension    Long term current use of systemic steroids 11/08/2020   Lupus    Lymphedema 10/07/2018   Morbid obesity (HCC)    Opiate abuse, episodic (HCC) 02/26/2018   Osteoporosis    Pain and swelling of right lower extremity 02/03/2023   Pneumonia    PONV (postoperative nausea and vomiting)    Postphlebitic syndrome with ulcer, 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   Presence of IVC filter 03/22/2020   Removed   Pulmonary embolism (HCC)    Renal disorder    Stage III   Severe episode of recurrent major depressive disorder, 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   Shock circulatory (HCC) 11/06/2023   STEMI (ST elevation myocardial infarction) (HCC) 11/05/2023    Assessment: Patient Reported Symptoms:  Cognitive Cognitive Status: No symptoms  reported   Health Maintenance Behaviors: None Healing Pattern: Slow Health Facilitated by: Pain control  Neurological Neurological Review of Symptoms: Weakness, Dizziness Neurological Management Strategies: Coping strategies, Routine screening Neurological Self-Management Outcome: 3 (uncertain)  HEENT HEENT Symptoms Reported: Change or loss of hearing, Tinnitus, Eye dryness, Mouth dryness (Left ear ringing and hearing loss since cardiac event in 10/2023 provider is aware ;eye dryness -drops given) HEENT Management Strategies: Coping strategies, Routine screening, Medication therapy HEENT Self-Management Outcome: 4 (good)    Cardiovascular Cardiovascular Symptoms Reported: Swelling in legs or feet, Dizziness (Dizziness occassionally) Does patient have uncontrolled Hypertension?: No Cardiovascular Management Strategies: Coping strategies, Medication therapy, Routine screening Weight: 219 lb (99.3 kg) Cardiovascular Self-Management Outcome: 4 (good)  Respiratory Respiratory Symptoms Reported: Shortness of breath Additional Respiratory Details: SOB on exertion Respiratory Management Strategies: Adequate rest, Medication therapy, Routine screening Respiratory Self-Management Outcome: 4 (good)  Endocrine Is patient diabetic?: No Endocrine Self-Management Outcome: 4 (good)  Gastrointestinal Gastrointestinal Symptoms Reported: Diarrhea, Constipation, Reflux/heartburn Additional Gastrointestinal Details: varies from time to time saw GI DR this week. Gastrointestinal Management Strategies: Diet modification, Exercise, Medication therapy, Coping strategies Gastrointestinal Self-Management Outcome: 4 (good)    Genitourinary Genitourinary Symptoms Reported: Frequency, Difficulty initiating stream, Urgency Genitourinary Management Strategies: Coping strategies, Medication therapy Genitourinary Self-Management Outcome: 4 (good)  Integumentary Integumentary Symptoms Reported: Bruising Additional  Integumentary Details: pateint reports R/T blood thinner Skin Management Strategies: Coping strategies Skin Self-Management Outcome: 4 (good)  Musculoskeletal Musculoskelatal Symptoms Reviewed: Limited mobility, Unsteady gait,  Weakness, Back pain, Joint pain Musculoskeletal Management Strategies: Medication therapy, Coping strategies, Routine screening Musculoskeletal Self-Management Outcome: 4 (good) Falls in the past year?: Yes Number of falls in past year: 2 or more Was there an injury with Fall?: Yes (fell in 10/2024 fell in kitchen  felt dizzy and fell trying to sit in chair head laceration/ fractured back) Fall Risk Category Calculator: 3 Patient Fall Risk Level: High Fall Risk Patient at Risk for Falls Due to: History of fall(s), Impaired balance/gait, Impaired mobility Fall risk Follow up: Falls evaluation completed, Education provided, Falls prevention discussed  Psychosocial Psychosocial Symptoms Reported: Depression - if selected complete PHQ 2-9, Anxiety - if selected complete GAD Behavioral Management Strategies: Coping strategies, Medication therapy, Community resources Behavioral Health Self-Management Outcome: 3 (uncertain) Major Change/Loss/Stressor/Fears (CP): Medical condition, self Techniques to Cope with Loss/Stress/Change: Medication, Counseling, Exercise Quality of Family Relationships: helpful, involved, supportive Do you feel physically threatened by others?: No      08/03/2024   11:00 AM  Depression screen PHQ 2/9  Decreased Interest 1  Down, Depressed, Hopeless 2  PHQ - 2 Score 3  Altered sleeping 3  Tired, decreased energy 3  Change in appetite 2  Feeling bad or failure about yourself  3  Trouble concentrating 1  Moving slowly or fidgety/restless 2  Suicidal thoughts 0  PHQ-9 Score 17  Difficult doing work/chores Extremely dIfficult    Vitals:   08/03/24 1048  BP: (!) 155/90    Medications Reviewed Today     Reviewed by Kay Hendricks MATSU, RN  (Case Manager) on 08/03/24 at 1027  Med List Status: <None>   Medication Order Taking? Sig Documenting Provider Last Dose Status Informant  albuterol  (VENTOLIN  HFA) 108 (90 Base) MCG/ACT inhaler [id number] Yes Inhale 2 puffs into the lungs every 6 (six) hours as needed for wheezing or shortness of breath. [provider]  Active   apixaban  (ELIQUIS ) 5 MG TABS tablet [id number] Yes Take 1 tablet (5 mg total) by mouth 2 (two) times daily. [name], [name], PA-C  Active Self  ARIPiprazole  (ABILIFY ) 2 MG tablet [id number] Yes Take 4 mg by mouth daily. [provider]  Active   azelastine (ASTELIN) 0.1 % nasal spray [id number] Yes 2 sprays 2 (two) times daily. [provider]  Active   BENLYSTA 200 MG/ML SHERMAN [id number] Yes  [provider]  Active   carbidopa-levodopa (SINEMET IR) 25-100 MG tablet [id number] Yes Take by mouth. [provider]  Active   cholecalciferol  (VITAMIN D3) 25 MCG (1000 UNIT) tablet [id number] Yes Take 1,000 Units by mouth daily. [provider]  Active Self  clonazePAM  (KLONOPIN ) 0.5 MG tablet [id number] Yes Take 0.5 mg by mouth daily. [provider]  Active Self  clopidogrel  (PLAVIX ) 75 MG tablet [id number] Yes TAKE 1 TABLET(75 MG) BY MOUTH DAILY [name], [name], PA-C  Active   cyanocobalamin  (VITAMIN B12) 1000 MCG/ML injection [id number] Yes Vit B12 1000 mcg IM once a week for 3 weeks then once a month for 4 months. During and after loading dose with injection treatment patient should also take 1000 mcg by mouth once a day. [provider]  Active   famotidine  (PEPCID ) 20 MG tablet [id number] Yes Take 1 tablet (20 mg total) by mouth 2 (two) times daily. Pender, Julie F, FNP  Active   fluticasone  (FLONASE ) 50 MCG/ACT nasal spray [id number] Yes Place 2 sprays into both nostrils daily. [name], [name], PA-C  Active   gabapentin  (NEURONTIN ) 300 MG capsule [id number] Yes Take 1  capsule (300 mg total) by mouth 2 (two) times daily.  Marcelino Nurse, MD  Active   hydrALAZINE  (APRESOLINE ) 25 MG tablet [id number] Yes Take 1 tablet (25 mg total) by mouth 3 (three) times daily.  Patient taking differently: Take 25 mg by mouth as needed.   [name], [name], PA-C  Active Self  HYDROcodone -acetaminophen  (NORCO/VICODIN) 5-325 MG tablet [id number]  Take 1-2 tablets by mouth every 12 (twelve) hours as needed for severe pain (pain score 7-10). Must last 30 days Lateef, Bilal, MD  Active   hydroxychloroquine (PLAQUENIL) 200 MG tablet [id number]  Take 200 mg by mouth 2 (two) times daily. [provider]  Active   hydrOXYzine  (ATARAX ) 25 MG tablet [id number]  Take 25-50 mg by mouth at bedtime as needed. [provider]  Active   loratadine  (CLARITIN ) 10 MG tablet [id number]  Take 10 mg by mouth daily. [provider]  Active   losartan  (COZAAR ) 25 MG tablet [id number]  Take 25 mg by mouth daily. [provider]  Active   meclizine  (ANTIVERT ) 12.5 MG tablet [id number]  Take 1 tablet (12.5 mg total) by mouth 3 (three) times daily as needed for dizziness. Jhonny Calvin NOVAK, MD  Active   metoprolol  succinate (TOPROL -XL) 25 MG 24 hr tablet [id number]  Take 1 tablet (25 mg total) by mouth daily. End, Lonni, MD  Active Self  midodrine  (PROAMATINE ) 10 MG tablet [id number]  Take 10 mg by mouth 3 (three) times daily. [provider]  Active   montelukast  (SINGULAIR ) 10 MG tablet [id number]  Take 1 tablet (10 mg total) by mouth at bedtime. [name], [name], PA-C  Active   Multiple Vitamins-Minerals (MULTIVITAMIN WITH MINERALS) tablet [id number] Yes Take 1 tablet by mouth daily. [provider]  Active Self  mycophenolate  (CELLCEPT ) 500 MG tablet [id number] Yes Take 2 tablets (1,000 mg total) by mouth 2 (two) times daily. HOLD until neurosurgery is ok to restart this (waiting for incision to heal)  Patient taking differently: Take 500 mg by mouth 2 (two) times daily.   Marsa Edelman, DO  Active Self   oxybutynin  (DITROPAN  XL) 15 MG 24 hr tablet [id number] Yes Take 15 mg by mouth daily. [provider]  Active   pantoprazole  (PROTONIX ) 20 MG tablet [id number] Yes Take 1 tablet (20 mg total) by mouth 2 (two) times daily. Loistine Sober, NP  Active   rosuvastatin  (CRESTOR ) 20 MG tablet [id number] Yes Take 1 tablet (20 mg total) by mouth daily. [name], [name], PA-C  Active Self  sertraline  (ZOLOFT ) 100 MG tablet [id number] Yes Take 1.5 tablets (150 mg total) by mouth daily. [name], [name], PA-C  Active Self  solifenacin  (VESICARE ) 5 MG tablet [id number] Yes Take 5 mg by mouth daily. [provider]  Active   Syringe/Needle, Disp, (SYRINGE 3CC/25GX1) 25G X 1 3 ML MISC [id number] Yes To be used with Vit B12 1000 mcg IM once a week for 4 weeks then once a month for 4 months. During and after loading dose with injection treatment patient should also take 1000 mcg by mouth once a day. [provider]  Active   tadalafil  (CIALIS ) 20 MG tablet [id number] Yes Take 1 tablet (20 mg total) by mouth daily as needed for erectile dysfunction. Avoid taking it if low blood pressure Von Bellis, MD  Active Self  tamsulosin  (FLOMAX ) 0.4 MG CAPS capsule [id number] Yes Take 1 capsule (0.4 mg total) by mouth daily. Penne Knee, MD  Active   traMADol  (ULTRAM ) 50 MG tablet [id number]  SMARTSIG:1  Tablet(s) By Mouth Every 12 Hours [provider]  Active   Med List Note Broadus Reda CROME, RN 07/20/24 1040): 07-07-24 Clearance to stop Plavix  and Eliquis  sent to Dr End via inbox. Kt MR 08-18-24 07-20-24 PA request for Hydrocodone  faxed to [facility]. DW            Recommendation:   Continue Current Plan of Care  Follow Up Plan:   Telephone follow-up in 1 month  [name] RN, BSN  [facility] RN Case Information systems manager [phone]

## 2024-08-04 ENCOUNTER — Encounter

## 2024-08-04 DIAGNOSIS — F332 Major depressive disorder, recurrent severe without psychotic features: Secondary | ICD-10-CM | POA: Diagnosis not present
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
## 2024-08-09 ENCOUNTER — Ambulatory Visit: Attending: Student in an Organized Health Care Education/Training Program | Admitting: Nurse Practitioner

## 2024-08-09 ENCOUNTER — Encounter: Payer: Self-pay | Admitting: Nurse Practitioner

## 2024-08-09 VITALS — BP 148/90 | HR 79 | Temp 97.5°F | Ht 74.0 in | Wt 219.0 lb

## 2024-08-09 DIAGNOSIS — M47812 Spondylosis without myelopathy or radiculopathy, cervical region: Secondary | ICD-10-CM | POA: Insufficient documentation

## 2024-08-09 DIAGNOSIS — Z981 Arthrodesis status: Secondary | ICD-10-CM | POA: Diagnosis not present

## 2024-08-09 DIAGNOSIS — Z79899 Other long term (current) drug therapy: Secondary | ICD-10-CM | POA: Diagnosis not present

## 2024-08-09 DIAGNOSIS — M5412 Radiculopathy, cervical region: Secondary | ICD-10-CM | POA: Insufficient documentation

## 2024-08-09 DIAGNOSIS — G894 Chronic pain syndrome: Secondary | ICD-10-CM | POA: Diagnosis not present

## 2024-08-09 MED ORDER — HYDROCODONE-ACETAMINOPHEN 5-325 MG PO TABS: 1.0000 | ORAL_TABLET | Freq: Three times a day (TID) | ORAL | 0 refills | Status: DC | PRN

## 2024-08-09 NOTE — Progress Notes (Signed)
 Safety precautions to be maintained throughout the outpatient stay will include: orient to surroundings, keep bed in low position, maintain call bell within reach at all times, provide assistance with transfer out of bed and ambulation.   Nursing Pain Medication Assessment:  Safety precautions to be maintained throughout the outpatient stay will include: orient to surroundings, keep bed in low position, maintain call bell within reach at all times, provide assistance with transfer out of bed and ambulation.  Medication Inspection Compliance: Jonathon Snow did not comply with our request to bring his pills to be counted. He was reminded that bringing the medication bottles, even when empty, is a requirement.  Medication: no bottle Pill/Patch Count: No pills/patches available to be counted. Pill/Patch Appearance: no pills Bottle Appearance: No container available. Did not bring bottle(s) to appointment. Filled Date: no pills / no pills / 2025 Last Medication intake:  Yesterday

## 2024-08-09 NOTE — Progress Notes (Signed)
 PROVIDER NOTE: Interpretation of information contained herein should be left to medically-trained personnel. Specific patient instructions are provided elsewhere under Patient Instructions section of medical record. This document was created in part using AI and STT-dictation technology, any transcriptional errors that may result from this process are unintentional.  Patient: Jonathon Snow  Service: E/M   PCP: [name], PA-C  DOB: 03/11/1961  DOS: 08/09/2024  Provider: Emmy MARLA Blanch, NP  MRN: [id number]  Delivery: Face-to-face  Specialty: Interventional Pain Management  Type: Established Patient  Setting: Ambulatory outpatient facility  Specialty designation: 09  Referring Prov.: [name], PA-C  Location: Outpatient office facility       History of present illness (HPI) [name], a 63 y.o. year old male, is here today because of his Chronic pain syndrome [G89.4]. [name] primary complain today is Neck Pain (Bilateral shoulers, arms, left leg and lower back)  Pertinent problems: [name] has Other spondylosis with radiculopathy, lumbar region; Overweight (BMI 25.0-29.9); Cervical radicular pain; History of thoracic spinal fusion (T7-T12); History of lumbar spinal fusion (L5-S1); and Chronic pain syndrome on their problem list.   Pain Assessment: Severity of Chronic pain is reported as a 7 /10. Location: Neck (bilateral shoulders, arms; left leg and lower back)  /radiates down left leg. Onset: More than a month ago. Quality: Constant, Aching (stinging). Timing: Constant. Modifying factor(s): laying down. Vitals:  height is 6' 2 (1.88 m) and weight is 219 lb (99.3 kg). His temperature is 97.5 F (36.4 C) (abnormal). His blood pressure is 148/90 (abnormal) and his pulse is 79. His oxygen saturation is 100%.  BMI: Estimated body mass index is 28.12 kg/m as calculated from the following:   Height as of this encounter: 6' 2 (1.88 m).   Weight as of this encounter: 219 lb (99.3  kg).  Last encounter: 07/19/2024 Last procedure: 06/15/2024.  Reason for encounter: medication management. No change in medical history since last visit.  Patient's pain is at baseline.  Patient continues multimodal pain regimen as prescribed.  States that it provides pain relief and improvement in functional status.  The patient is currently taking Norco twice daily; however, he reports inadequate pain relief with 12-hour dosing interval.  I recommended adjusting the regimen to every 8 hours at the same dosage to provide better pain coverage until his interventional cervical treatment.  This adjustment is intended for short-term management only and not for long-term use.  Pharmacotherapy Assessment   Hydrocodone -acetaminophen  (Norco/Vicodin) 5-325 mg tablet every 8 hours as needed for pain. MME=10 Monitoring: [place] PMP: PDMP reviewed during this encounter.       Pharmacotherapy: No side-effects or adverse reactions reported. Compliance: No problems identified. Effectiveness: Clinically acceptable.  Jonathon Nathanel PARAS, RN  08/09/2024  9:00 AM  Sign when Signing Visit Safety precautions to be maintained throughout the outpatient stay will include: orient to surroundings, keep bed in low position, maintain call bell within reach at all times, provide assistance with transfer out of bed and ambulation.   Nursing Pain Medication Assessment:  Safety precautions to be maintained throughout the outpatient stay will include: orient to surroundings, keep bed in low position, maintain call bell within reach at all times, provide assistance with transfer out of bed and ambulation.  Medication Inspection Compliance: Jonathon Snow did not comply with our request to bring his pills to be counted. He was reminded that bringing the medication bottles, even when empty, is a requirement.  Medication: no bottle Pill/Patch Count: No pills/patches  available to be counted. Pill/Patch Appearance: no pills Bottle Appearance:  No container available. Did not bring bottle(s) to appointment. Filled Date: no pills / no pills / 2025 Last Medication intake:  Yesterday    UDS:  Summary  Date Value Ref Range Status  07/19/2024 FINAL  Final    Comment:    ==================================================================== Compliance Drug Analysis, Ur ==================================================================== Test                             Result       Flag       Units  Drug Present and Declared for Prescription Verification   Metoprolol                      PRESENT      EXPECTED  Drug Present not Declared for Prescription Verification   Tramadol                        166          UNEXPECTED ng/mg creat   O-Desmethyltramadol            1291         UNEXPECTED ng/mg creat    Source of tramadol  is a prescription medication. O-desmethyltramadol    is an expected metabolite of tramadol .  Drug Absent but Declared for Prescription Verification   Clonazepam                      Not Detected UNEXPECTED ng/mg creat   Sertraline                      Not Detected UNEXPECTED   Aripiprazole                    Not Detected UNEXPECTED ==================================================================== Test                      Result    Flag   Units      Ref Range   Creatinine              94               mg/dL      >=79 ==================================================================== Declared Medications:  The flagging and interpretation on this report are based on the  following declared medications.  Unexpected results may arise from  inaccuracies in the declared medications.   **Note: The testing scope of this panel includes these medications:   Aripiprazole  (Abilify )  Clonazepam  (Klonopin )  Metoprolol   Sertraline  (Zoloft )   **Note: The testing scope of this panel does not include the  following reported medications:   Apixaban  (Eliquis )  Azelastine (Astelin)  Belimumab (Benlysta)  Carbidopa  (Sinemet)  Clopidogrel  (Plavix )  Levodopa (Sinemet)  Midodrine  (Proamatine )  Montelukast  (Singulair )  Multivitamin  Mycophenolate  mofetil (Cellcept )  Oxybutynin  (Ditropan )  Pantoprazole  (Protonix )  Rosuvastatin  (Crestor )  Solifenacin  (Vesicare )  Vitamin B12  Vitamin D3 ==================================================================== For clinical consultation, please call [phone]. ====================================================================     No results found for: CBDTHCR No results found for: D8THCCBX No results found for: D9THCCBX  ROS  Constitutional: Denies any fever or chills Gastrointestinal: No reported hemesis, hematochezia, vomiting, or acute GI distress Musculoskeletal: Neck Pain (Bilateral shoulers, arms, left leg and lower back) Neurological: No reported episodes of acute onset apraxia, aphasia, dysarthria, agnosia, amnesia, paralysis, loss of coordination, or loss of consciousness  Medication Review  ARIPiprazole , Belimumab, HYDROcodone -acetaminophen , SYRINGE 3CC/25GX1, albuterol , apixaban , azelastine, carbidopa-levodopa, cholecalciferol , clonazePAM , clopidogrel , cyanocobalamin , famotidine , fluticasone , gabapentin , hydrALAZINE , hydrOXYzine , hydroxychloroquine, loratadine , losartan , meclizine , metoprolol  succinate, midodrine , montelukast , multivitamin with minerals, mycophenolate , oxybutynin , pantoprazole , rosuvastatin , sertraline , solifenacin , tadalafil , tamsulosin , and traMADol   History Review  Allergy: Jonathon Snow is allergic to enalapril and vicodin [hydrocodone -acetaminophen ]. Drug: Jonathon Snow  reports no history of drug use. Alcohol:  reports no history of alcohol use. Tobacco:  reports that he has never smoked. He has never used smokeless tobacco. Social: Jonathon Snow  reports that he has never smoked. He has never used smokeless tobacco. He reports that he does not drink alcohol and does not use drugs. 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), Closed left hip fracture, initial encounter (HCC) (02/18/2023), COVID-19 virus infection (12/2020), Depression, DVT (deep venous thrombosis) (HCC), Dyspnea, GERD (gastroesophageal reflux disease), Head injury (11/05/2023), Heart attack (HCC) (11/05/2023), Heel spur, left (02/16/2019), Heel spur, right (02/16/2019), Hematoma of groin (11/08/2023), Herpes zoster infection of lumbar region (02/20/2020), History of DVT (deep vein thrombosis) (03/22/2020), History of kidney stones, Hyperlipidemia, Hypertension, Long term current use of systemic steroids (11/08/2020), Lupus, Lymphedema (10/07/2018), Morbid obesity (HCC), Opiate abuse, episodic (HCC) (02/26/2018), Osteoporosis, Pain and swelling of right lower extremity (02/03/2023), Pneumonia, PONV (postoperative nausea and vomiting), Postphlebitic syndrome with ulcer, 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 Surgical: Jonathon Snow  has a past surgical history that includes Cyst excision (92 or 93 ); Ankle surgery (Right); I & D extremity (Right, 04/29/2017); Irrigation and debridement abscess (Left, 04/29/2017); Colonoscopy with propofol  (N/A, 05/28/2020); Flexible bronchoscopy (N/A, 11/01/2021); Bronchial washings (N/A, 11/01/2021); Back surgery; Hip pinning, cannulated (Left, 02/19/2023); Colonoscopy with propofol  (N/A, 07/01/2023); polypectomy (07/01/2023); Coronary/Graft Acute MI Revascularization (N/A, 11/05/2023); LEFT HEART CATH AND CORONARY ANGIOGRAPHY (N/A,  11/05/2023); and Coronary Ultrasound/IVUS (N/A, 11/05/2023). Family: family history includes Clotting disorder in his mother; Heart attack in his maternal grandfather, maternal grandmother, and paternal grandfather; Heart disease in his father; Hypertension in his father; Kidney disease in his brother.  Laboratory Chemistry Profile   Renal Lab Results  Component Value Date   BUN 24 07/28/2024   CREATININE 1.59 (H) 07/28/2024   LABCREA 153 11/08/2023   BCR 15 07/28/2024   GFRAA 53 (L) 11/07/2020   GFRNONAA 50 (L) 02/27/2024    Hepatic Lab Results  Component Value Date   AST 11 07/28/2024   ALT 9 07/28/2024   ALBUMIN  3.7 02/16/2024   ALKPHOS 91 02/16/2024   LIPASE 34 11/05/2023    Electrolytes Lab Results  Component Value Date   NA 137 07/28/2024   K 4.1 07/28/2024   CL 106 07/28/2024   CALCIUM  9.2 07/28/2024   MG 2.0 11/06/2023   PHOS 3.8 11/06/2023    Bone Lab Results  Component Value Date   VD25OH 35 11/15/2018    Inflammation (CRP: Acute Phase) (ESR: Chronic Phase) Lab Results  Component Value Date   CRP 0.8 09/10/2021   ESRSEDRATE 25 (H) 09/10/2021   LATICACIDVEN 1.3 08/22/2021         Note: Above Lab results reviewed.  Recent Imaging Review  DG PAIN CLINIC C-ARM 1-60 MIN NO REPORT Fluoro  was used, but no Radiologist interpretation will be provided.  Please refer to NOTES tab for provider progress note. Note: Reviewed        Physical Exam  Vitals: BP (!) 148/90 Comment: Jonathon Snow notified  Pulse 79   Temp (!) 97.5 F (36.4 C)   Ht 6' 2 (1.88 m)   Wt 219 lb (99.3 kg)   SpO2 100%   BMI 28.12 kg/m  BMI: Estimated body mass index is 28.12 kg/m as calculated from the following:   Height as of this encounter: 6' 2 (1.88 m).   Weight as of this encounter: 219 lb (99.3 kg). Ideal: Ideal body weight: 82.2 kg (181 lb 3.5 oz) Adjusted ideal body weight: 89.1 kg (196 lb 5.3 oz) General appearance: Well nourished, well developed, and well hydrated. In no  apparent acute distress Mental status: Alert, oriented x 3 (person, place, & time)       Respiratory: No evidence of acute respiratory distress Eyes: PERLA   Assessment   Diagnosis Status  1. Chronic pain syndrome   2. Cervical facet joint syndrome   3. Cervical radicular pain   4. History of lumbar spinal fusion (L5-S1)   5. Cervical spondylosis without myelopathy   6. Medication management    Controlled Controlled Controlled   Updated Problems: No problems updated.  Plan of Care  Problem-specific:  Assessment and Plan We will continue on current medication regimen.  Prescribing drug monitoring (PDMP) reviewed; findings consistent with the use of prescribed medication and no evidence of narcotic misuse or abuse.  Urine drug screening (UDS) up-to-date.  No other new issues or problems reported to this visit.  Schedule follow-up 30 days for medication management.   Jonathon Snow has a current medication list which includes the following long-term medication(s): apixaban , carbidopa-levodopa, clonazepam , famotidine , fluticasone , gabapentin , hydralazine , losartan , metoprolol  succinate, montelukast , pantoprazole , rosuvastatin , sertraline , and tadalafil .  Pharmacotherapy (Medications Ordered): Meds ordered this encounter  Medications   HYDROcodone -acetaminophen  (NORCO/VICODIN) 5-325 MG tablet    Sig: Take 1 tablet by mouth every 8 (eight) hours as needed for severe pain (pain score 7-10). Must last 30 days    Dispense:  90 tablet    Refill:  0    Chronic Pain: STOP Act (Not applicable) Fill 1 day early if closed on refill date. Avoid benzodiazepines within 8 hours of opioids   Orders:  No orders of the defined types were placed in this encounter.       Return in about 1 month (around 09/09/2024) for (F2F), (MM), Emmy Blanch NP.    Recent Visits Date Type Provider Dept  07/19/24 Office Visit Marcelino Nurse, MD [facility]  07/07/24 Office Visit Marcelino Nurse,  MD [facility]  06/15/24 Procedure visit Marcelino Nurse, MD [facility]  05/26/24 Office Visit Marcelino Nurse, MD [facility]  Showing recent visits within past 90 days and meeting all other requirements Today's Visits Date Type Provider Dept  08/09/24 Office Visit Raja Liska K, NP [facility]  Showing today's visits and meeting all other requirements Future Appointments Date Type Provider Dept  09/05/24 Appointment Ricci Paff K, NP [facility]  Showing future appointments within next 90 days and meeting all other requirements  I discussed the assessment and treatment plan with the patient. The patient was provided an opportunity to ask questions and all were answered. The patient agreed with the plan and demonstrated an understanding of the instructions.  Patient advised to call back or  seek an in-person evaluation if the symptoms or condition worsens.  Duration of encounter: 30 minutes.  Total time on encounter, as per AMA guidelines included both the face-to-face and non-face-to-face time personally spent by the physician and/or other qualified health care professional(s) on the day of the encounter (includes time in activities that require the physician or other qualified health care professional and does not include time in activities normally performed by clinical staff). Physician's time may include the following activities when performed: Preparing to see the patient (e.g., pre-charting review of records, searching for previously ordered imaging, lab work, and nerve conduction tests) Review of prior analgesic pharmacotherapies. Reviewing PMP Interpreting ordered tests (e.g., lab work, imaging, nerve conduction tests) Performing post-procedure evaluations, including interpretation of diagnostic procedures Obtaining and/or reviewing separately obtained history Performing a medically appropriate examination and/or  evaluation Counseling and educating the patient/family/caregiver Ordering medications, tests, or procedures Referring and communicating with other health care professionals (when not separately reported) Documenting clinical information in the electronic or other health record Independently interpreting results (not separately reported) and communicating results to the patient/ family/caregiver Care coordination (not separately reported)  Note by: Danelly Hassinger K Mykeal Carrick, NP (TTS and AI technology used. I apologize for any typographical errors that were not detected and corrected.) Date: 08/09/2024; Time: 9:59 AM

## 2024-08-10 ENCOUNTER — Ambulatory Visit

## 2024-08-10 DIAGNOSIS — M542 Cervicalgia: Secondary | ICD-10-CM

## 2024-08-10 DIAGNOSIS — M5459 Other low back pain: Secondary | ICD-10-CM

## 2024-08-10 DIAGNOSIS — M5412 Radiculopathy, cervical region: Secondary | ICD-10-CM | POA: Diagnosis not present

## 2024-08-10 DIAGNOSIS — R262 Difficulty in walking, not elsewhere classified: Secondary | ICD-10-CM

## 2024-08-10 DIAGNOSIS — M5416 Radiculopathy, lumbar region: Secondary | ICD-10-CM | POA: Diagnosis not present

## 2024-08-10 NOTE — Therapy (Signed)
 OUTPATIENT PHYSICAL THERAPY TREATMENT    Patient Name: Jonathon Snow MRN: [id number] DOB:1961/02/06, 63 y.o., male Today's Date: 08/10/2024  END OF SESSION:  PT End of Session - 08/10/24 1507     Visit Number 17    Number of Visits 33    Date for PT Re-Evaluation 08/26/24    Authorization Type HB [id number] for 4 PT vsts from 8/12-9/10    Authorization - Visit Number 1    Authorization - Number of Visits 4    PT Start Time 1508    PT Stop Time 1549    PT Time Calculation (min) 41 min    Activity Tolerance Patient tolerated treatment well;Patient limited by pain    Behavior During Therapy [facility] for tasks assessed/performed                         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   Overview:  superior sagittal sinus, left transverse sinus and cortical veins    Closed left hip fracture, initial encounter (HCC) 02/18/2023   COVID-19 virus infection 12/2020   Depression    DVT (deep venous thrombosis) (HCC)    Dyspnea    GERD (gastroesophageal reflux disease)    Head injury 11/05/2023   Heart attack (HCC) 11/05/2023   Heel spur, left 02/16/2019   Heel spur, right 02/16/2019   Hematoma of groin 11/08/2023   Herpes zoster infection of lumbar region 02/20/2020   History of DVT (deep vein thrombosis) 03/22/2020   History of kidney stones    Hyperlipidemia    Hypertension    Long term current use of systemic steroids 11/08/2020   Lupus    Lymphedema 10/07/2018   Morbid obesity (HCC)    Opiate abuse, episodic (HCC) 02/26/2018   Osteoporosis    Pain and swelling of right lower extremity 02/03/2023   Pneumonia    PONV (postoperative nausea and vomiting)    Postphlebitic syndrome with ulcer, 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    Presence of IVC filter 03/22/2020   Removed   Pulmonary embolism (HCC)    Renal disorder    Stage III   Severe episode of recurrent major depressive disorder, 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   Shock circulatory (HCC) 11/06/2023   STEMI (ST elevation myocardial infarction) (HCC) 11/05/2023   Past Surgical History:  Procedure Laterality Date   ANKLE SURGERY Right    BACK SURGERY     BRONCHIAL WASHINGS N/A 11/01/2021   Procedure: BRONCHIAL WASHINGS;  Surgeon: Parris Manna, MD;  Location: [facility] ORS;  Service: Thoracic;  Laterality: N/A;   COLONOSCOPY WITH PROPOFOL  N/A 05/28/2020   Procedure: COLONOSCOPY WITH PROPOFOL ;  Surgeon: Therisa Bi, MD;  Location: [facility] ENDOSCOPY;  Service: Endoscopy;  Laterality: N/A;   COLONOSCOPY WITH PROPOFOL  N/A 07/01/2023   Procedure: COLONOSCOPY WITH PROPOFOL ;  Surgeon: Therisa Bi, MD;  Location: [facility] ENDOSCOPY;  Service: Gastroenterology;  Laterality: N/A;   CORONARY ULTRASOUND/IVUS N/A 11/05/2023   Procedure: Coronary Ultrasound/IVUS;  Surgeon: Mady Bruckner, MD;  Location: [facility] INVASIVE CV LAB;  Service: Cardiovascular;  Laterality: N/A;   CORONARY/GRAFT ACUTE MI REVASCULARIZATION N/A 11/05/2023   Procedure: Coronary/Graft Acute MI Revascularization;  Surgeon: Mady Bruckner, MD;  Location: [facility] INVASIVE CV LAB;  Service: Cardiovascular;  Laterality: N/A;   CYST EXCISION  92 or 93    Liver cyst removal [facility]   FLEXIBLE BRONCHOSCOPY N/A 11/01/2021   Procedure: FLEXIBLE BRONCHOSCOPY;  Surgeon: Parris Manna, MD;  Location: [facility] ORS;  Service: Thoracic;  Laterality: N/A;   HIP PINNING,CANNULATED Left 02/19/2023   Procedure: PERCUTANEOUS FIXATION OF FEMORAL NECK;  Surgeon: Tobie Priest, MD;  Location: [facility] ORS;  Service: Orthopedics;  Laterality: Left;   I & D EXTREMITY Right 04/29/2017   Procedure: IRRIGATION AND DEBRIDEMENT EXTREMITY;  Surgeon: Shelva Dunnings, MD;  Location: [facility] ORS;  Service: General;  Laterality: Right;   IRRIGATION  AND DEBRIDEMENT ABSCESS Left 04/29/2017   Procedure: IRRIGATION AND DEBRIDEMENT Scrotal ABSCESS;  Surgeon: Shelva Dunnings, MD;  Location: [facility] ORS;  Service: General;  Laterality: Left;   LEFT HEART CATH AND CORONARY ANGIOGRAPHY N/A 11/05/2023   Procedure: LEFT HEART CATH AND CORONARY ANGIOGRAPHY;  Surgeon: Mady Bruckner, MD;  Location: [facility] INVASIVE CV LAB;  Service: Cardiovascular;  Laterality: N/A;   POLYPECTOMY  07/01/2023   Procedure: POLYPECTOMY;  Surgeon: Therisa Bi, MD;  Location[facility] ENDOSCOPY;  Service: Gastroenterology;;   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) 01/20/2024   Anxiety 12/14/2023   Overweight (BMI 25.0-29.9) 11/23/2023   Chronic heart failure with preserved ejection fraction (HFpEF) (HCC) 11/23/2023   BPH (benign prostatic hyperplasia) 11/23/2023   Unstable angina (HCC) 11/10/2023   Closed T10 spinal fracture (HCC) 11/06/2023   Closed tricolumnar fracture of thoracic vertebra (HCC) 11/06/2023   Thoracic spine instability 11/06/2023   Chronic bilateral low back pain with left-sided sciatica 07/07/2023   Neuropathy 07/07/2023   Hx of colonic polyps 07/01/2023   Adenomatous polyp of colon 07/01/2023   Erectile disorder 01/22/2023   Coronary artery disease involving native coronary artery of native heart without angina pectoris 09/09/2022   Varicose 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 01/03/2022    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) 09/18/2021   Prediabetes 11/08/2020   Stage 3a chronic kidney disease (HCC) 11/08/2020   Seasonal allergies 11/08/2020   Rhinosinusitis 11/08/2020   Anticoagulant disorder (HCC) 11/07/2020   Senile purpura (HCC) 11/07/2020   MDD (major depressive disorder), recurrent episode, mild (HCC) 09/12/2019   Other spondylosis with radiculopathy, lumbar region 02/16/2019   Osteoporosis 10/12/2018   Chronic venous insufficiency 10/07/2018   Lymphedema 10/07/2018   SLE glomerulonephritis syndrome, 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HCC) 03/03/2018   Syncope and collapse 12/29/2017   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) 10/29/2016   Hyperlipidemia LDL goal <70 01/15/2016   Uncontrolled hypertension 01/15/2016   Obesity (BMI 30.0-34.9) 01/15/2016   Long term current use of anticoagulant 10/04/2015   Systemic lupus erythematosus (HCC) 05/30/2015   COPD, moderate (HCC) 06/27/2014   Shortness of breath 06/27/2014   History of pulmonary embolism 12/11/2013   Nodule of right lung 12/11/2013   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   Cystic disease of liver 08/21/2013    PCP: [name], PA-C REFERRING  PROVIDER: Clois Fret, MD REFERRING DIAG: [phone] (ICD-10-CM) - Cervical radiculopathy M54.50,G89.29 (ICD-10-CM) - Chronic bilateral low back pain without sciatica  Rationale for Evaluation and Treatment: Rehabilitation  THERAPY DIAG:  Cervicalgia  Radiculopathy, cervical region  Other low back pain  Difficulty in walking, not elsewhere classified  Radiculopathy, lumbar region  ONSET DATE: November 2024  SUBJECTIVE:                                                                                                                                                                                           SUBJECTIVE STATEMENT: Has posterior headache. 5/10 neck pain currently. Was put back on  hydrocodone . 6/10 back pain currently at start of session.      PERTINENT HISTORY:  Cervicalgia, low back pain. Pt also reports R > L C5/C6 dermatome to elbow bilaterally. Pt has a difficult time turning his head and looking up. No neck surgery. Had Thoracic and lumbar spine fusion surgery. Had mid back surgery (thoracic) November 2024 due to his fall. Neck pain is more recent. Neck and shoulder pain began November 2024 after his thoracic surgery. Usually sleeps on his L side but is currently difficulty to do secondary to B shoulder and neck pain, so pt is not sleeping well. Neck pain is the same since November 2024. Pt tends to have issues getting dizzy and passes out when he is standing and exerting himself  Vision gets blurry and splotchy. Room feels like it is waving around, pt is not walking on a stable surface, feels like he is walking on a boat and not having sea legs.   The IVC filter was removed.  Still has L LE DVT. Pt states taking Elequis. L LE DVT is still being treated.    PAIN:  Are you having pain? 5/10 pain in bilat UT area of neck and shoulders Rt> Lt.   PRECAUTIONS:  -dizzy and passes out when he is standing and exerting himself; chronic orthostatic hypotension; Lt LE DVT, fall risk  RED FLAGS: Bowel or bladder incontinence: No and Cauda equina syndrome: No   WEIGHT BEARING RESTRICTIONS: No  FALLS:  Has patient fallen in last 6 months? Yes. Number of falls 2 (original fall was when pt had a heart attack November 2024 and hurt his back. February 22, 2024, pt passed out and fell into the bath tub.   LIVING ENVIRONMENT: Lives with: lives alone Lives in: House/apartment Stairs: No Has following equipment at home: Single point cane and Environmental consultant - 2 wheeled  OCCUPATION: Not currently working  PLOF: Needs assistance with homemaking   PATIENT GOALS: be able to do everything better.  NEXT MD VISIT: Yes but does not know when.   OBJECTIVE:  Note: Objective measures were  completed at Evaluation unless otherwise noted.  DIAGNOSTIC FINDINGS:  MR CERVICAL SPINE WO CONTRAST 02/26/2024  Narrative & Impression  CLINICAL DATA:  Myelopathy, acute, cervical spine   EXAM: MRI CERVICAL SPINE WITHOUT CONTRAST   TECHNIQUE: Multiplanar, multisequence MR imaging of the cervical spine was performed. No intravenous contrast was administered.   COMPARISON:  CT of the cervical spine November 05, 2023.   FINDINGS: Alignment: No substantial sagittal subluxation.   Vertebrae: No evidence of acute fracture, evidence of discitis, or suspicious bone lesion. Benign vertebral venous malformation at T3. Mild T1-T2 height loss, unchanged.   Cord: Normal cord signal.  Neck   Posterior Fossa, vertebral arteries, paraspinal tissues: Visualized vertebral artery flow voids are maintained. No paraspinal edema.   Disc levels:   C2-C3: No significant disc protrusion, foraminal stenosis, or canal stenosis.   C3-C4: No significant disc protrusion, foraminal stenosis, or canal stenosis.   C4-C5: Right eccentric posterior disc osteophyte complex with right greater than left facet and uncovertebral hypertrophy. Resulting mild right greater than left foraminal stenosis. Mild canal stenosis.   C5-C6: Posterior disc osteophyte complex ligamentum flavum thickening and right greater than left facet and uncovertebral hypertrophy. Resulting mild-to-moderate bilateral foraminal stenosis. Mild to moderate canal stenosis.   C6-C7: Posterior disc osteophyte complex with bilateral facet and uncovertebral hypertrophy. Resulting moderate bilateral foraminal stenosis. Mild canal stenosis.   C7-T1: No significant disc protrusion, foraminal stenosis, or canal stenosis.   IMPRESSION: 1. 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 2. At C5-C6, mild to moderate canal and bilateral foraminal stenosis. 3. At C4-C5, mild canal and bilateral foraminal stenosis.      Electronically Signed   By: Gilmore GORMAN Molt M.D.   On: 02/27/2024 02:42         PATIENT SURVEYS:  Neck Disability Index score: 41/50 (82%) (05/02/2024)  COGNITION: Overall cognitive status: Within functional limits for tasks assessed     SENSATION:   MUSCLE LENGTH:   POSTURE:   PALPATION: TTP to posterior neck   CERVICAL ROM:   Active ROM A/PROM (deg) eval  Flexion WFL with posterior neck pain  Extension Very limited with pain, movement preference around C5/C6 area  Right lateral flexion Very limited with pain  Left lateral flexion Very limited with pain  Right rotation 20 degrees with pain  Left rotation 22 degrees with pain   (Blank rows = not tested)  UPPER EXTREMITY MMT:  MMT Right eval Left eval  Shoulder flexion 3+ with neck and shoulder and arm pain 4- with pain at available range.   Shoulder extension    Shoulder abduction    Shoulder adduction    Shoulder extension    Shoulder internal rotation    Shoulder external rotation    Middle trapezius    Lower trapezius (seated manually resisted) 3+ with neck pain 3+ with neck pain  Elbow flexion    Elbow extension    Wrist flexion    Wrist extension    Wrist ulnar deviation    Wrist radial deviation    Wrist pronation    Wrist supination    Grip strength     (Blank rows = not tested)  UPPER EXTREMITY ROM:  Active ROM Right eval Left eval  Shoulder flexion 55 (scaption) 55 (scaption)  Shoulder extension    Shoulder abduction    Shoulder adduction    Shoulder extension  Shoulder internal rotation    Shoulder external rotation    Elbow flexion    Elbow extension    Wrist flexion    Wrist extension    Wrist ulnar deviation    Wrist radial deviation    Wrist pronation    Wrist supination     (Blank rows = not tested)    LUMBAR ROM:   AROM eval  Flexion   Extension   Right lateral flexion   Left lateral flexion   Right rotation   Left rotation    (Blank rows = not  tested)  LOWER EXTREMITY ROM:     Passive  Right eval Left eval  Hip flexion    Hip extension    Hip abduction    Hip adduction    Hip internal rotation    Hip external rotation    Knee flexion    Knee extension    Ankle dorsiflexion    Ankle plantarflexion    Ankle inversion    Ankle eversion     (Blank rows = not tested)  LOWER EXTREMITY MMT:    MMT Right eval Left eval  Hip flexion    Hip extension    Hip abduction    Hip adduction    Hip internal rotation    Hip external rotation    Knee flexion    Knee extension    Ankle dorsiflexion    Ankle plantarflexion    Ankle inversion    Ankle eversion     (Blank rows = not tested)  GAIT: Distance walked: 30 ft Assistive device utilized: None Level of assistance: SBA Comments: antalgic, decreased stance R LE, forward flexed, decreased gait speed  TREATMENT DATE: 07/28/2024    Neuromuscular re education At start of session: Blood pressure L arm sitting, mechanically taken, normal cuff: 138/76, HR 74 SpO2 98 %, room air   Reclined position with folded towel behind upper thoracic spine to promote gentle extension and decrease stress to neck.   Then with B scapular retraction 8x   Then with cervical nod 5x3   With B shoulder flexion to comfortable range to promote upper thoracic extension and decrease stress to neck    5x3   Transversus abdominis contraction 5x, then 10x3   Glute max contraction 10x3  Reclined position without towel behind upper thoracic spine  with cervical nod 5x3   With B shoulder flexion to comfortable range to promote upper thoracic extension and decrease stress to neck    5x3   Improving B shoulder flexion AROM observed.   with cervical nod 5x3   With B shoulder flexion to comfortable range to promote upper thoracic extension and decrease stress to neck    5x3     Improved exercise technique, movement at target joints, use of target muscles after mod verbal, visual,  tactile cues.          PATIENT EDUCATION:  Education details: POC Person educated: Patient Education method: Explanation Education comprehension: verbalized understanding  HOME EXERCISE PROGRAM: Pt was recommended to press his tongue at the roof of his mouth to activate his anterior cervical muscles throughout the day. Pt demonstrated and verbalized understanding.    Access Code: [email or web address] URL: [email or web address] Date: 05/10/2024 Prepared by: Emil Glassman  Exercises - Seated Cervical Flexion AROM  - 2 x daily - 7 x weekly - 3 sets - 5 reps - Seated Transversus Abdominis Bracing  - 1 x daily - 7 x weekly - 2 -3 sets -  10 reps - 5 seconds hold - Seated Chin Tuck with Neck Elongation  - 2-3 x daily - 7 x weekly - 2-3 sets - 5 reps - Seated Cervical Sidebending AROM  - 3 x daily - 7 x weekly - 4 sets - 5 reps  - Seated Transversus Abdominis Bracing  - 3 x daily - 7 x weekly - 3 sets - 10 reps - Seated Gluteal Sets  - 3 x daily - 7 x weekly - 3 sets - 10 reps - Supine Shoulder Flexion Extension Full Range AROM  - 3 x daily - 7 x weekly - 3 sets - 5 reps  ASSESSMENT:  CLINICAL IMPRESSION: Worked on promoting upper thoracic extension as well as anterior cervical muscle activation in the reclined position to help decrease extension stress to his neck. Improved B shoulder flexion AROM observed during shoulder flexion exercises. Added transversus abdominis and glute max activation to help decrease stress to low back. Fair tolerance to today's session. Pt will benefit from continued skilled physical therapy services to decrease pain, improve strength and function.     OBJECTIVE IMPAIRMENTS: Abnormal gait, decreased balance, decreased endurance, difficulty walking, decreased ROM, decreased strength, dizziness, impaired UE functional use, improper body mechanics, postural dysfunction, and pain.   ACTIVITY LIMITATIONS: carrying, lifting, bending, sitting, standing,  squatting, sleeping, stairs, transfers, bed mobility, bathing, toileting, dressing, reach over head, hygiene/grooming, locomotion level, and caring for others  PARTICIPATION LIMITATIONS:   PERSONAL FACTORS: Age, Fitness, Past/current experiences, Time since onset of injury/illness/exacerbation, and 3+ comorbidities: DVT, depression, dyspnea, heart attack, HTN, osteoporosis, are also affecting patient's functional outcome.   REHAB POTENTIAL: Fair    CLINICAL DECISION MAKING: Stable/uncomplicated  EVALUATION COMPLEXITY: Low   GOALS: Goals reviewed with patient? Yes  SHORT TERM GOALS: Target date: 05/13/2024  Pt will be independent with his initial HEP to improve strength, decrease pain, improve ability to lift, look around as well as ambulate and perform standing tasks with less difficulty and pain. Baseline: Pt has not yet officially started his initial HEP (05/02/2024) Goal status: ACHIEVED   LONG TERM GOALS: Target date: 07/01/2024  Pt will have a decrease in neck pain to 5/10 or less at worst to promote ability to look around more comfortably.  Baseline: 9/10 neck pain at worst for the past 3 months (05/02/2024); 6/10 at most for 7 days (06/02/2024); 06/14/24: 7-8/10 ; 6/10 at most for the past 7 days (06/27/2024); 6/10 on average for the past 7 days, 7/10 at most for the past 7 days, pt taking hydrocodone  again (07/28/2024) Goal status: ONGOING  2.  Pt will have a decrease on low back pain to 4/10 or less at worst to promote ability to tolerate sitting, standing, walking and performing standing tasks more comfortably for his back.  Baseline: 8/10 low back pain at worst for past 7 days; 6/10 worst pain in past week 06/14/24; 7/10 at most for the past 7 days (06/27/2024); 6/10 back pain at most for the past 7 days (07/28/2024) Goal status: PROGRESSING  3.  Pt will improve his Neck Disability Index (NDI) score by at least 20% as a demonstration of improved function.  Baseline: Neck Disability Index  score: 41/50 (82%) (05/02/2024); 06/14/24: 58% NDI Goal status: ACHIEVED   4.  Pt will improve his cervical rotation AROM to 50 degrees or more to promote ability to look around more comfortably. Baseline:  Active ROM A/PROM (deg) eval AROM (06/02/2024) AROM (07/28/2024)  Right rotation 20 degrees with pain 30  degrees with pain 28 with pain  Left rotation 22 degrees with pain 35 degrees with pain 23 with pain   (05/02/2024) Goal status: ONGOING  5.  Pt will improve B shoulder flexion AROM to 100 degrees or more to promote ability to reach as well as lift more comfortably.  Baseline:  Active ROM Right eval Left eval R (06/02/2024) L (06/02/2024) R (06/27/2024) L  (06/27/2024) R (07/28/2024) L (07/28/2024)  Shoulder flexion 55 (scaption) 55 (scaption) 44  (Scaption) 65  (Scaption) 56 degrees with R upper trap and lateral shoulder and arm pain to elbow.  83 degrees with anterior shoulder and arm pain 62 with pain 85 with pain at end range   (05/02/2024)  Goal status: PROGRESSING  6.  Pt will improve his shoulder flexion and lower trap strength by at least 1/2 MMT grade to promote ability to raise his arms as well as lift with less difficulty.  Baseline:  MMT Right eval Left eval R (06/27/2024) L (06/27/2024) R (07/28/2024) L (07/28/2024)  Shoulder flexion 3+ with neck and shoulder and arm pain 4- with pain at available range.  4-/5 manual resistance at available range with R UE pain to hand 4/5 manual resistance at available range 4-/5 manual resistance at available range with  pain  4/5 manual resistance at available range  Lower trapezius (seated manually resisted) 3+ with neck pain 3+ with neck pain 3+ 4 3+ with pain 4 with pain   (05/02/2024) Goal status: IN PROGRESS   PLAN:  PT FREQUENCY: 1-2x/week  PT DURATION: 8 weeks  PLANNED INTERVENTIONS: 97110-Therapeutic exercises, 97530- Therapeutic activity, [id number]- Neuromuscular re-education, 97535- Self Care, [id number]- Manual therapy, [id number]-  Gait training, [phone]- Electrical stimulation (unattended), 02966- Ionotophoresis 4mg /ml Dexamethasone , Patient/Family education, and Balance training.  PLAN FOR NEXT SESSION: posture, B scapular, anterior cervical, trunk and glute strengthening, mechanics, manual techniques, modalities PRN    Stormy Connon, PT, DPT 08/10/2024, 3:53 PM

## 2024-08-11 DIAGNOSIS — F332 Major depressive disorder, recurrent severe without psychotic features: Secondary | ICD-10-CM | POA: Diagnosis not present

## 2024-08-15 ENCOUNTER — Other Ambulatory Visit: Payer: Self-pay | Admitting: Family Medicine

## 2024-08-15 ENCOUNTER — Encounter: Payer: Self-pay | Admitting: Psychiatry

## 2024-08-15 ENCOUNTER — Ambulatory Visit (INDEPENDENT_AMBULATORY_CARE_PROVIDER_SITE_OTHER): Admitting: Psychiatry

## 2024-08-15 VITALS — BP 138/86 | HR 71 | Temp 98.6°F | Ht 74.0 in | Wt 223.8 lb

## 2024-08-15 DIAGNOSIS — F331 Major depressive disorder, recurrent, moderate: Secondary | ICD-10-CM

## 2024-08-15 DIAGNOSIS — I10 Essential (primary) hypertension: Secondary | ICD-10-CM

## 2024-08-15 DIAGNOSIS — I2510 Atherosclerotic heart disease of native coronary artery without angina pectoris: Secondary | ICD-10-CM

## 2024-08-15 DIAGNOSIS — F411 Generalized anxiety disorder: Secondary | ICD-10-CM

## 2024-08-15 MED ORDER — BUSPIRONE HCL 5 MG PO TABS: 5.0000 mg | ORAL_TABLET | Freq: Two times a day (BID) | ORAL | 1 refills | Status: DC

## 2024-08-15 NOTE — Patient Instructions (Signed)
 Start buspar  5 mg twice a day  Continue sertraline  200 mg daily Continue abilify  4 mg daily  Continue clonazepam  0.5 mg daily as needed for anxiety  Next appointment- 10/20 at 8:30

## 2024-08-16 ENCOUNTER — Telehealth (INDEPENDENT_AMBULATORY_CARE_PROVIDER_SITE_OTHER): Payer: Self-pay

## 2024-08-16 ENCOUNTER — Other Ambulatory Visit: Payer: Self-pay | Admitting: Nurse Practitioner

## 2024-08-16 ENCOUNTER — Telehealth: Payer: Self-pay | Admitting: Student in an Organized Health Care Education/Training Program

## 2024-08-16 DIAGNOSIS — K219 Gastro-esophageal reflux disease without esophagitis: Secondary | ICD-10-CM

## 2024-08-16 NOTE — Telephone Encounter (Signed)
He can come in for unna boots

## 2024-08-16 NOTE — Telephone Encounter (Signed)
 Patient left a message stating that he is having left leg swelling and pain. Patient has open wounds on the left leg. I did reach out to the patient to get further information. Please Advise

## 2024-08-16 NOTE — Telephone Encounter (Signed)
Patient is scheduled for tomorrow morning.

## 2024-08-16 NOTE — Telephone Encounter (Signed)
 PT called stated that he is still having pain in right and left hand. Left hand is the worst. PT stated that he us  having a hard time grabbing things. PT wants to see what can the next step be. Please give patient a call. TY

## 2024-08-16 NOTE — Telephone Encounter (Signed)
 Will you call patient and tell him please. I am over in the OR for a while.

## 2024-08-17 ENCOUNTER — Encounter: Payer: Self-pay | Admitting: Nurse Practitioner

## 2024-08-17 ENCOUNTER — Ambulatory Visit: Attending: Nurse Practitioner | Admitting: Nurse Practitioner

## 2024-08-17 ENCOUNTER — Ambulatory Visit (INDEPENDENT_AMBULATORY_CARE_PROVIDER_SITE_OTHER): Admitting: Nurse Practitioner

## 2024-08-17 ENCOUNTER — Encounter (INDEPENDENT_AMBULATORY_CARE_PROVIDER_SITE_OTHER): Payer: Self-pay | Admitting: Nurse Practitioner

## 2024-08-17 ENCOUNTER — Other Ambulatory Visit: Payer: Self-pay | Admitting: Family Medicine

## 2024-08-17 VITALS — BP 132/83 | HR 71 | Resp 18 | Wt 220.6 lb

## 2024-08-17 VITALS — BP 145/92 | HR 76 | Temp 97.9°F | Ht 74.0 in | Wt 221.0 lb

## 2024-08-17 DIAGNOSIS — I890 Lymphedema, not elsewhere classified: Secondary | ICD-10-CM | POA: Diagnosis not present

## 2024-08-17 DIAGNOSIS — G894 Chronic pain syndrome: Secondary | ICD-10-CM | POA: Diagnosis not present

## 2024-08-17 DIAGNOSIS — M5412 Radiculopathy, cervical region: Secondary | ICD-10-CM | POA: Insufficient documentation

## 2024-08-17 DIAGNOSIS — M47812 Spondylosis without myelopathy or radiculopathy, cervical region: Secondary | ICD-10-CM | POA: Insufficient documentation

## 2024-08-17 DIAGNOSIS — K219 Gastro-esophageal reflux disease without esophagitis: Secondary | ICD-10-CM

## 2024-08-17 MED ORDER — KETOROLAC TROMETHAMINE 30 MG/ML IJ SOLN
30.0000 mg | Freq: Once | INTRAMUSCULAR | Status: DC
  Filled 2024-08-17: qty 1

## 2024-08-17 MED ORDER — METHOCARBAMOL 1000 MG/10ML IJ SOLN
200.0000 mg | Freq: Once | INTRAMUSCULAR | Status: AC
  Administered 2024-08-17: 200 mg via INTRAMUSCULAR
  Filled 2024-08-17: qty 10

## 2024-08-17 NOTE — Progress Notes (Signed)
 PROVIDER NOTE: Interpretation of information contained herein should be left to medically-trained personnel. Specific patient instructions are provided elsewhere under Patient Instructions section of medical record. This document was created in part using AI and STT-dictation technology, any transcriptional errors that may result from this process are unintentional.  Patient: Jonathon Snow  Service: E/M   PCP: [name], PA-C  DOB: 1961-05-13  DOS: 08/17/2024  Provider: Emmy MARLA Blanch, NP  MRN: [id number]  Delivery: Face-to-face  Specialty: Interventional Pain Management  Type: Established Patient  Setting: Ambulatory outpatient facility  Specialty designation: 09  Referring Prov.: [name], PA-C  Location: Outpatient office facility       History of present illness (HPI) [name], a 63 y.o. year old male, is here today because of his Cervical facet joint syndrome [M47.812]. [name] primary complain today is Neck Pain (Neck and right arm, hand)  Pertinent problems: [name] has Other spondylosis with radiculopathy, lumbar region; Overweight (BMI 25.0-29.9); Cervical radicular pain; History of thoracic spinal fusion (T7-T12); History of lumbar spinal fusion (L5-S1); and Chronic pain syndrome on their problem list.   Pain Assessment: Severity of Chronic pain is reported as a 7 /10. Location: Neck  /radiates down right arm to right hand. Onset: More than a month ago. Quality: Constant, Stabbing. Timing: Constant. Modifying factor(s): denies. Vitals:  height is 6' 2 (1.88 m) and weight is 221 lb (100.2 kg). His temperature is 97.9 F (36.6 C). His blood pressure is 145/92 (abnormal) and his pulse is 76. His oxygen saturation is 100%.  BMI: Estimated body mass index is 28.37 kg/m as calculated from the following:   Height as of this encounter: 6' 2 (1.88 m).   Weight as of this encounter: 221 lb (100.2 kg).  Last encounter: 08/09/2024. Last procedure: Visit date not  found.  Reason for encounter: evaluation of worsening, or previously known (established) problem.  The patient presents with complaint of neck pain and right shoulder pain radiating down to the right.  He has already discussed this with Dr. Marcelino, who recommended a cervical facet medial branch block.  However, the procedure requires him come off his Plavix  and Eliquis , or alternatively obtain cardiac clearance from his cardiology or vascular team.  Due to persistent neck pain, we discussed and proceed with the Robaxin  IM injection.  Given his history of chronic kidney disease, Toradol  IM injection was discontinued for safety reasons.  Pharmacotherapy Assessment    Monitoring: [name]: PDMP reviewed during this encounter.       Pharmacotherapy: No side-effects or adverse reactions reported. Compliance: No problems identified. Effectiveness: Clinically acceptable.  [name], RN  08/17/2024 10:30 AM  Sign when Signing Visit Toradol  not given. Discarded per ALONSO Berlin, RN.   [name], RN  08/17/2024  9:15 AM  Sign when Signing Visit Safety precautions to be maintained throughout the outpatient stay will include: orient to surroundings, keep bed in low position, maintain call bell within reach at all times, provide assistance with transfer out of bed and ambulation.     UDS:  Summary  Date Value Ref Range Status  07/19/2024 FINAL  Final    Comment:    ==================================================================== Compliance Drug Analysis, Ur ==================================================================== Test                             Result       Flag       Units  Drug  Present and Declared for Prescription Verification   Metoprolol                      PRESENT      EXPECTED  Drug Present not Declared for Prescription Verification   Tramadol                        166          UNEXPECTED ng/mg creat   O-Desmethyltramadol            1291         UNEXPECTED ng/mg  creat    Source of tramadol  is a prescription medication. O-desmethyltramadol    is an expected metabolite of tramadol .  Drug Absent but Declared for Prescription Verification   Clonazepam                      Not Detected UNEXPECTED ng/mg creat   Sertraline                      Not Detected UNEXPECTED   Aripiprazole                    Not Detected UNEXPECTED ==================================================================== Test                      Result    Flag   Units      Ref Range   Creatinine              94               mg/dL      >=79 ==================================================================== Declared Medications:  The flagging and interpretation on this report are based on the  following declared medications.  Unexpected results may arise from  inaccuracies in the declared medications.   **Note: The testing scope of this panel includes these medications:   Aripiprazole  (Abilify )  Clonazepam  (Klonopin )  Metoprolol   Sertraline  (Zoloft )   **Note: The testing scope of this panel does not include the  following reported medications:   Apixaban  (Eliquis )  Azelastine (Astelin)  Belimumab (Benlysta)  Carbidopa (Sinemet)  Clopidogrel  (Plavix )  Levodopa (Sinemet)  Midodrine  (Proamatine )  Montelukast  (Singulair )  Multivitamin  Mycophenolate  mofetil (Cellcept )  Oxybutynin  (Ditropan )  Pantoprazole  (Protonix )  Rosuvastatin  (Crestor )  Solifenacin  (Vesicare )  Vitamin B12  Vitamin D3 ==================================================================== For clinical consultation, please call [phone]. ====================================================================     No results found for: CBDTHCR No results found for: D8THCCBX No results found for: D9THCCBX  ROS  Constitutional: Denies any fever or chills Gastrointestinal: No reported hemesis, hematochezia, vomiting, or acute GI distress Musculoskeletal:  Neck Pain (Neck and right arm,  hand) Neurological: No reported episodes of acute onset apraxia, aphasia, dysarthria, agnosia, amnesia, paralysis, loss of coordination, or loss of consciousness  Medication Review  ARIPiprazole , Belimumab, HYDROcodone -acetaminophen , SYRINGE 3CC/25GX1, albuterol , apixaban , azelastine, busPIRone , carbidopa-levodopa, cholecalciferol , clonazePAM , clopidogrel , cyanocobalamin , famotidine , fluticasone , gabapentin , hydrALAZINE , hydrOXYzine , hydroxychloroquine, loratadine , losartan , meclizine , metoprolol  succinate, midodrine , montelukast , multivitamin with minerals, mycophenolate , oxybutynin , pantoprazole , rosuvastatin , sertraline , solifenacin , tadalafil , and tamsulosin   History Review  Allergy: Mr. Bracco is allergic to enalapril and vicodin [hydrocodone -acetaminophen ]. Drug: Mr. Keadle  reports no history of drug use. Alcohol:  reports no history of alcohol use. Tobacco:  reports that he has never smoked. He has never used smokeless tobacco. Social: Mr. Mikles  reports that he has never smoked. He has never used smokeless tobacco. He reports that he does not  drink alcohol and does not use drugs. 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), Closed left hip fracture, initial encounter (HCC) (02/18/2023), COVID-19 virus infection (12/2020), Depression, DVT (deep venous thrombosis) (HCC), Dyspnea, GERD (gastroesophageal reflux disease), Head injury (11/05/2023), Heart attack (HCC) (11/05/2023), Heel spur, left (02/16/2019), Heel spur, right (02/16/2019), Hematoma of groin (11/08/2023), Herpes zoster infection of lumbar region (02/20/2020), History of DVT (deep vein thrombosis) (03/22/2020), History of kidney stones, HTN (hypertension), Hyperlipidemia, Hypertension, Long term current use  of systemic steroids (11/08/2020), Lupus, Lymphedema (10/07/2018), Morbid obesity (HCC), Opiate abuse, episodic (HCC) (02/26/2018), Osteoporosis, Pain and swelling of right lower extremity (02/03/2023), Pneumonia, PONV (postoperative nausea and vomiting), Postphlebitic syndrome with ulcer, 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 Surgical: Mr. Brislin  has a past surgical history that includes Cyst excision (92 or 93 ); Ankle surgery (Right); I & D extremity (Right, 04/29/2017); Irrigation and debridement abscess (Left, 04/29/2017); Colonoscopy with propofol  (N/A, 05/28/2020); Flexible bronchoscopy (N/A, 11/01/2021); Bronchial washings (N/A, 11/01/2021); Back surgery; Hip pinning, cannulated (Left, 02/19/2023); Colonoscopy with propofol  (N/A, 07/01/2023); polypectomy (07/01/2023); Coronary/Graft Acute MI Revascularization (N/A, 11/05/2023); LEFT HEART CATH AND CORONARY ANGIOGRAPHY (N/A, 11/05/2023); and Coronary Ultrasound/IVUS (N/A, 11/05/2023). Family: family history includes Clotting disorder in his mother; Heart attack in his maternal grandfather, maternal grandmother, and paternal grandfather; Heart disease in his father; Hypertension in his father; Kidney disease in his brother.  Laboratory Chemistry Profile   Renal Lab Results  Component Value Date   BUN 24 07/28/2024   CREATININE 1.59 (H) 07/28/2024   LABCREA 153 11/08/2023   BCR 15 07/28/2024   GFRAA 53 (L) 11/07/2020   GFRNONAA 50 (L) 02/27/2024    Hepatic Lab Results  Component Value Date   AST 11 07/28/2024   ALT 9 07/28/2024   ALBUMIN  3.7 02/16/2024   ALKPHOS 91 02/16/2024   LIPASE 34 11/05/2023    Electrolytes Lab Results  Component Value Date   NA 137  07/28/2024   K 4.1 07/28/2024   CL 106 07/28/2024   CALCIUM  9.2 07/28/2024   MG 2.0 11/06/2023   PHOS 3.8 11/06/2023    Bone Lab Results  Component Value Date   VD25OH 35 11/15/2018    Inflammation (CRP: Acute Phase) (ESR: Chronic Phase) Lab Results  Component Value Date   CRP 0.8 09/10/2021   ESRSEDRATE 25 (H) 09/10/2021   LATICACIDVEN 1.3 08/22/2021         Note: Above Lab results reviewed.  Recent Imaging Review  DG PAIN CLINIC C-ARM 1-60 MIN NO REPORT Fluoro was used, but no Radiologist interpretation will be provided.  Please refer to NOTES tab for provider progress note. Note: Reviewed        Physical Exam  Vitals: BP (!) 145/92   Pulse 76   Temp 97.9 F (36.6 C)   Ht 6' 2 (1.88 m)   Wt 221 lb (100.2 kg)   SpO2 100%   BMI 28.37 kg/m  BMI: Estimated body mass index is 28.37 kg/m as calculated from the following:   Height as of this encounter: 6' 2 (1.88 m).   Weight as of this encounter: 221 lb (100.2 kg). Ideal: Ideal body  weight: 82.2 kg (181 lb 3.5 oz) Adjusted ideal body weight: 89.4 kg (197 lb 2.1 oz) General appearance: Well nourished, well developed, and well hydrated. In no apparent acute distress Mental status: Alert, oriented x 3 (person, place, & time)       Respiratory: No evidence of acute respiratory distress Eyes: PERLA   Assessment   Diagnosis Status  1. Cervical facet joint syndrome   2. Chronic pain syndrome   3. Cervical radicular pain   4. Cervical spondylosis without myelopathy    Controlled Controlled Controlled   Updated Problems: No problems updated.  Plan of Care  Problem-specific:  Assessment and Plan  Robaxin  IM injection given Discontinue Toradol  IM injection due to kidney disease Sample of Journavx was provided to the patient  Mr. KYRE JEFFRIES has a current medication list which includes the following long-term medication(s): apixaban , carbidopa-levodopa, clonazepam , famotidine , fluticasone , gabapentin ,  hydralazine , losartan , metoprolol  succinate, montelukast , pantoprazole , rosuvastatin , sertraline , and tadalafil .  Pharmacotherapy (Medications Ordered): Meds ordered this encounter  Medications   DISCONTD: ketorolac  (TORADOL ) 30 MG/ML injection 30 mg   methocarbamol  (ROBAXIN ) injection 200 mg   Orders:  No orders of the defined types were placed in this encounter.       No follow-ups on file.    Recent Visits Date Type Provider Dept  08/09/24 Office Visit Jovan Schickling K, NP [facility]  07/19/24 Office Visit Marcelino Nurse, MD [facility]  07/07/24 Office Visit Marcelino Nurse, MD [facility]  06/15/24 Procedure visit Marcelino Nurse, MD [facility]  05/26/24 Office Visit Marcelino Nurse, MD [facility]  Showing recent visits within past 90 days and meeting all other requirements Today's Visits Date Type Provider Dept  08/17/24 Office Visit Shauntae Reitman K, NP [facility]  Showing today's visits and meeting all other requirements Future Appointments Date Type Provider Dept  09/05/24 Appointment Genaro Bekker K, NP [facility]  Showing future appointments within next 90 days and meeting all other requirements  I discussed the assessment and treatment plan with the patient. The patient was provided an opportunity to ask questions and all were answered. The patient agreed with the plan and demonstrated an understanding of the instructions.  Patient advised to call back or seek an in-person evaluation if the symptoms or condition worsens.  Duration of encounter: 20 minutes.  Total time on encounter, as per AMA guidelines included both the face-to-face and non-face-to-face time personally spent by the physician and/or other qualified health care professional(s) on the day of the encounter (includes time in activities that require the physician or other qualified health care professional and does not include time in  activities normally performed by clinical staff). Physician's time may include the following activities when performed: Preparing to see the patient (e.g., pre-charting review of records, searching for previously ordered imaging, lab work, and nerve conduction tests) Review of prior analgesic pharmacotherapies. Reviewing PMP Interpreting ordered tests (e.g., lab work, imaging, nerve conduction tests) Performing post-procedure evaluations, including interpretation of diagnostic procedures Obtaining and/or reviewing separately obtained history Performing a medically appropriate examination and/or evaluation Counseling and educating the patient/family/caregiver Ordering medications, tests, or procedures Referring and communicating with other health care professionals (when not separately reported) Documenting clinical information in the electronic or other health record Independently interpreting results (not separately reported) and communicating results to the patient/ family/caregiver Care coordination (not separately reported)  Note by: Rashay Barnette K Kashlyn Salinas, NP (TTS and AI technology used. I apologize for any typographical  errors that were not detected and corrected.) Date: 08/17/2024; Time: 11:39 AM

## 2024-08-17 NOTE — Progress Notes (Signed)
 Toradol  not given. Discarded per ALONSO Berlin, RN.

## 2024-08-17 NOTE — Progress Notes (Signed)
 Safety precautions to be maintained throughout the outpatient stay will include: orient to surroundings, keep bed in low position, maintain call bell within reach at all times, provide assistance with transfer out of bed and ambulation.

## 2024-08-17 NOTE — Telephone Encounter (Signed)
 Requested Prescriptions  Pending Prescriptions Disp Refills   clopidogrel  (PLAVIX ) 75 MG tablet [Pharmacy Med Name: CLOPIDOGREL  75MG  TABLETS] 90 tablet 0    Sig: TAKE 1 TABLET(75 MG) BY MOUTH DAILY     Hematology: Antiplatelets - clopidogrel  Failed - 08/17/2024  8:19 AM      Failed - Cr in normal range and within 360 days    Creat  Date Value Ref Range Status  07/28/2024 1.59 (H) 0.70 - 1.35 mg/dL Final   Creatinine, Urine  Date Value Ref Range Status  11/08/2023 153 mg/dL Final         Passed - HCT in normal range and within 180 days    HCT  Date Value Ref Range Status  07/28/2024 43.1 38.5 - 50.0 % Final   Hematocrit  Date Value Ref Range Status  12/21/2023 38.1 37.5 - 51.0 % Final         Passed - HGB in normal range and within 180 days    Hemoglobin  Date Value Ref Range Status  07/28/2024 13.3 13.2 - 17.1 g/dL Final  97/74/7974 87.1 (L) 13.0 - 17.0 g/dL Final  87/69/7975 88.0 (L) 13.0 - 17.7 g/dL Final         Passed - PLT in normal range and within 180 days    Platelets  Date Value Ref Range Status  07/28/2024 189 140 - 400 Thousand/uL Final  12/21/2023 198 150 - 450 x10E3/uL Final   Platelet Count  Date Value Ref Range Status  02/16/2024 181 150 - 400 K/uL Final         Passed - Valid encounter within last 6 months    Recent Outpatient Visits           2 weeks ago Neuropathy   [facility] [name], PA-C   1 month ago Essential hypertension   [facility] Gareth Mliss FALCON, FNP   4 months ago Otalgia, left   [facility] [name], PA-C   4 months ago Non-recurrent acute suppurative otitis media of left ear without spontaneous rupture of tympanic membrane   [facility] Gareth Mliss FALCON, FNP   6 months ago Uncontrolled hypertension   [facility] [name], PA-C       Future Appointments             In 1 week  Hester Alm BROCKS, MD [facility]   In 1 month Wittenborn, Barnie, NP [facility] [place]   In 2 months [name], PA-C [facility], [place]   In 7 months Maurine Lukes, [facility] [place]

## 2024-08-17 NOTE — Telephone Encounter (Signed)
 Requested Prescriptions  Pending Prescriptions Disp Refills   famotidine  (PEPCID ) 20 MG tablet [Pharmacy Med Name: FAMOTIDINE  20MG  TABLETS] 60 tablet 0    Sig: TAKE 1 TABLET(20 MG) BY MOUTH TWICE DAILY     Gastroenterology:  H2 Antagonists Passed - 08/17/2024  2:09 PM      Passed - Valid encounter within last 12 months    Recent Outpatient Visits           2 weeks ago Neuropathy   [facility] [name], PA-C   1 month ago Essential hypertension   [facility] Gareth Mliss FALCON, FNP   4 months ago Otalgia, left   [facility] [name], PA-C   4 months ago Non-recurrent acute suppurative otitis media of left ear without spontaneous rupture of tympanic membrane   [facility] Gareth Mliss FALCON, FNP   6 months ago Uncontrolled hypertension   [facility] [name], PA-C       Future Appointments             In 1 week Hester Alm BROCKS, MD [facility] [place]   In 1 month Wittenborn, Barnie, NP [facility] [place]   In 2 months [name], PA-C [facility] [place]   In 7 months Maurine Lukes, [facility] [place]

## 2024-08-17 NOTE — Progress Notes (Signed)
 History of Present Illness  There is no documented history at this time  Assessments & Plan   There are no diagnoses linked to this encounter.    Additional instructions  Subjective:  Patient presents with venous ulcer of the Left lower extremity.    Procedure:  3 layer unna wrap was placed Left lower extremity.   Plan:   Follow up in one week.

## 2024-08-18 DIAGNOSIS — G20B1 Parkinson's disease with dyskinesia, without mention of fluctuations: Secondary | ICD-10-CM | POA: Diagnosis not present

## 2024-08-18 DIAGNOSIS — E538 Deficiency of other specified B group vitamins: Secondary | ICD-10-CM | POA: Diagnosis not present

## 2024-08-18 DIAGNOSIS — M1652 Unilateral post-traumatic osteoarthritis, left hip: Secondary | ICD-10-CM | POA: Diagnosis not present

## 2024-08-18 DIAGNOSIS — S72002D Fracture of unspecified part of neck of left femur, subsequent encounter for closed fracture with routine healing: Secondary | ICD-10-CM | POA: Diagnosis not present

## 2024-08-18 DIAGNOSIS — F332 Major depressive disorder, recurrent severe without psychotic features: Secondary | ICD-10-CM | POA: Diagnosis not present

## 2024-08-18 DIAGNOSIS — M25552 Pain in left hip: Secondary | ICD-10-CM | POA: Diagnosis not present

## 2024-08-18 DIAGNOSIS — I951 Orthostatic hypotension: Secondary | ICD-10-CM | POA: Diagnosis not present

## 2024-08-19 ENCOUNTER — Ambulatory Visit
Admission: RE | Admit: 2024-08-19 | Discharge: 2024-08-19 | Disposition: A | Discharge status: Home / Self Care | Source: Ambulatory Visit | Attending: Otolaryngology | Admitting: Otolaryngology

## 2024-08-19 DIAGNOSIS — H9193 Unspecified hearing loss, bilateral: Secondary | ICD-10-CM | POA: Diagnosis not present

## 2024-08-19 DIAGNOSIS — H9319 Tinnitus, unspecified ear: Secondary | ICD-10-CM | POA: Diagnosis not present

## 2024-08-19 MED ORDER — GADOPICLENOL 0.5 MMOL/ML IV SOLN
10.0000 mL | Freq: Once | INTRAVENOUS | Status: AC | PRN
  Administered 2024-08-19: 10 mL via INTRAVENOUS

## 2024-08-19 NOTE — Telephone Encounter (Signed)
 Requested Prescriptions  Pending Prescriptions Disp Refills   famotidine  (PEPCID ) 20 MG tablet [Pharmacy Med Name: FAMOTIDINE  20MG  TABLETS] 180 tablet 0    Sig: TAKE 1 TABLET(20 MG) BY MOUTH TWICE DAILY     Gastroenterology:  H2 Antagonists Passed - 08/19/2024 11:51 AM      Passed - Valid encounter within last 12 months    Recent Outpatient Visits           3 weeks ago Neuropathy   [facility] [name], PA-C   1 month ago Essential hypertension   [facility] Gareth Mliss FALCON, FNP   4 months ago Otalgia, left   [facility] [name], PA-C   4 months ago Non-recurrent acute suppurative otitis media of left ear without spontaneous rupture of tympanic membrane   [facility] Gareth Mliss FALCON, FNP   6 months ago Uncontrolled hypertension   [facility] [name], PA-C       Future Appointments             In 5 days Hester Alm BROCKS, MD [facility] [place] [facility]   In 1 month Wittenborn, Barnie, NP [facility] [place]   In 2 months [name], PA-C [facility] [place]   In 7 months Maurine Lukes, [facility] [place]

## 2024-08-21 ENCOUNTER — Encounter: Payer: Self-pay | Admitting: Family Medicine

## 2024-08-22 ENCOUNTER — Encounter (INDEPENDENT_AMBULATORY_CARE_PROVIDER_SITE_OTHER): Payer: Self-pay | Admitting: Nurse Practitioner

## 2024-08-23 ENCOUNTER — Other Ambulatory Visit: Payer: Self-pay | Admitting: Family Medicine

## 2024-08-23 DIAGNOSIS — N401 Enlarged prostate with lower urinary tract symptoms: Secondary | ICD-10-CM

## 2024-08-24 ENCOUNTER — Other Ambulatory Visit: Payer: Self-pay

## 2024-08-24 ENCOUNTER — Ambulatory Visit: Admitting: Dermatology

## 2024-08-24 ENCOUNTER — Ambulatory Visit: Attending: Neurosurgery

## 2024-08-24 ENCOUNTER — Encounter: Admitting: Family Medicine

## 2024-08-24 DIAGNOSIS — M5459 Other low back pain: Secondary | ICD-10-CM | POA: Insufficient documentation

## 2024-08-24 DIAGNOSIS — M5416 Radiculopathy, lumbar region: Secondary | ICD-10-CM | POA: Insufficient documentation

## 2024-08-24 DIAGNOSIS — M5412 Radiculopathy, cervical region: Secondary | ICD-10-CM | POA: Diagnosis present

## 2024-08-24 DIAGNOSIS — R262 Difficulty in walking, not elsewhere classified: Secondary | ICD-10-CM | POA: Insufficient documentation

## 2024-08-24 DIAGNOSIS — M542 Cervicalgia: Secondary | ICD-10-CM | POA: Insufficient documentation

## 2024-08-24 NOTE — Telephone Encounter (Signed)
 Too soon for refill, [facility] 04/18/24 for 30 and 11 RF. Lasted refilled by another provider.  Requested Prescriptions  Pending Prescriptions Disp Refills   tamsulosin  (FLOMAX ) 0.4 MG CAPS capsule [Pharmacy Med Name: TAMSULOSIN  0.4MG  CAPSULES] 90 capsule     Sig: TAKE 1 CAPSULE(0.4 MG) BY MOUTH DAILY AFTER SUPPER     Urology: Alpha-Adrenergic Blocker Failed - 08/24/2024 11:04 AM      Failed - Last BP in normal range    BP Readings from Last 1 Encounters:  08/17/24 (!) 145/92         Passed - PSA in normal range and within 360 days    PSA  Date Value Ref Range Status  07/28/2018 1.0 < OR = 4.0 ng/mL Final    Comment:    The total PSA value from this assay system is  standardized against the WHO standard. The test  result will be approximately 20% lower when compared  to the equimolar-standardized total PSA (Beckman  Coulter). Comparison of serial PSA results should be  interpreted with this fact in mind. . This test was performed using the Siemens  chemiluminescent method. Values obtained from  different assay methods cannot be used interchangeably. PSA levels, regardless of value, should not be interpreted as absolute evidence of the presence or absence of disease.    Prostate Specific Ag, Serum  Date Value Ref Range Status  03/18/2024 0.4 0.0 - 4.0 ng/mL Final    Comment:    Roche ECLIA methodology. According to the American Urological Association, Serum PSA should decrease and remain at undetectable levels after radical prostatectomy. The AUA defines biochemical recurrence as an initial PSA value 0.2 ng/mL or greater followed by a subsequent confirmatory PSA value 0.2 ng/mL or greater. Values obtained with different assay methods or kits cannot be used interchangeably. Results cannot be interpreted as absolute evidence of the presence or absence of malignant disease.          Passed - Valid encounter within last 12 months    Recent Outpatient Visits           3 weeks  ago Neuropathy   [facility] [name], PA-C   1 month ago Essential hypertension   [facility] Gareth Mliss FALCON, FNP   4 months ago Otalgia, left   [facility] [name], PA-C   4 months ago Non-recurrent acute suppurative otitis media of left ear without spontaneous rupture of tympanic membrane   [facility] Gareth Mliss FALCON, FNP   6 months ago Uncontrolled hypertension   [facility] [name], PA-C       Future Appointments             In 1 month Wittenborn, Barnie, NP [facility] [place]   In 2 months [name], PA-C [facility] [place]   In 3 months Hester Alm BROCKS, MD [facility]   In 7 months Maurine Lukes, [facility] [place]

## 2024-08-24 NOTE — Patient Instructions (Signed)
 Visit Information  Thank you for taking time to visit with me today. Please don't hesitate to contact me if I can be of assistance to you before our next scheduled appointment.  Your next care management appointment is by telephone on 09-21-2024 at 1:00 PM   Telephone follow-up in 1 month  Please call the care guide team at [phone] if you need to cancel, schedule, or reschedule an appointment.   Please call the Suicide and Crisis Lifeline: 988 call the [place]  National Suicide Prevention Lifeline: 418-321-0431 or TTY: 218 669 2243 TTY 4150987726) to talk to a trained counselor call 1-800-273-TALK (toll free, 24 hour hotline) call 911 if you are experiencing a Mental Health or Behavioral Health Crisis or need someone to talk to.  [name] RN, BSN  [place] [facility]Population [facility] RN Case Information systems manager [phone]

## 2024-08-24 NOTE — Therapy (Signed)
 OUTPATIENT PHYSICAL THERAPY TREATMENT    Patient Name: Jonathon Snow MRN: [id number] DOB:09-01-1961, 63 y.o., male Today's Date: 08/24/2024  END OF SESSION:  PT End of Session - 08/24/24 0820     Visit Number 18    Number of Visits 33    Date for PT Re-Evaluation 08/26/24    Authorization Type HB [id number] for 4 PT vsts from 8/12-9/10    Authorization - Visit Number 2    Authorization - Number of Visits 4    PT Start Time 0821    PT Stop Time 0859    PT Time Calculation (min) 38 min    Activity Tolerance Patient tolerated treatment well;Patient limited by pain    Behavior During Therapy [facility] for tasks assessed/performed                          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   Overview:  superior sagittal sinus, left transverse sinus and cortical veins    Closed left hip fracture, initial encounter (HCC) 02/18/2023   COVID-19 virus infection 12/2020   Depression    DVT (deep venous thrombosis) (HCC)    Dyspnea    GERD (gastroesophageal reflux disease)    Head injury 11/05/2023   Heart attack (HCC) 11/05/2023   Heel spur, left 02/16/2019   Heel spur, right 02/16/2019   Hematoma of groin 11/08/2023   Herpes zoster infection of lumbar region 02/20/2020   History of DVT (deep vein thrombosis) 03/22/2020   History of kidney stones    HTN (hypertension)    Hyperlipidemia    Hypertension    Long term current use of systemic steroids 11/08/2020   Lupus    Lymphedema 10/07/2018   Morbid obesity (HCC)    Opiate abuse, episodic (HCC) 02/26/2018   Osteoporosis    Pain and swelling of right lower extremity 02/03/2023   Pneumonia    PONV (postoperative nausea and vomiting)    Postphlebitic syndrome with ulcer, left  (HCC) 11/18/2016   Presence of IVC filter 03/22/2020   Removed   Pulmonary embolism (HCC)    Renal disorder    Stage III   Severe episode of recurrent major depressive disorder, 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   Shock circulatory (HCC) 11/06/2023   STEMI (ST elevation myocardial infarction) (HCC) 11/05/2023   Past Surgical History:  Procedure Laterality Date   ANKLE SURGERY Right    BACK SURGERY     BRONCHIAL WASHINGS N/A 11/01/2021   Procedure: BRONCHIAL WASHINGS;  Surgeon: Parris Manna, MD;  Location: [facility] ORS;  Service: Thoracic;  Laterality: N/A;   COLONOSCOPY WITH PROPOFOL  N/A 05/28/2020   Procedure: COLONOSCOPY WITH PROPOFOL ;  Surgeon: Therisa Bi, MD;  Location: [facility] ENDOSCOPY;  Service: Endoscopy;  Laterality: N/A;   COLONOSCOPY WITH PROPOFOL  N/A 07/01/2023   Procedure: COLONOSCOPY WITH PROPOFOL ;  Surgeon: Therisa Bi, MD;  Location: [facility] ENDOSCOPY;  Service: Gastroenterology;  Laterality: N/A;   CORONARY ULTRASOUND/IVUS N/A 11/05/2023   Procedure: Coronary Ultrasound/IVUS;  Surgeon: Mady Bruckner, MD;  Location: [facility] INVASIVE CV LAB;  Service: Cardiovascular;  Laterality: N/A;   CORONARY/GRAFT ACUTE MI REVASCULARIZATION N/A 11/05/2023   Procedure: Coronary/Graft Acute MI Revascularization;  Surgeon: Mady Bruckner, MD;  Location: [facility] INVASIVE CV LAB;  Service: Cardiovascular;  Laterality: N/A;   CYST EXCISION  92 or 93    Liver cyst removal [facility]   FLEXIBLE BRONCHOSCOPY N/A 11/01/2021   Procedure: FLEXIBLE BRONCHOSCOPY;  Surgeon: Parris Manna, MD;  Location: [facility] ORS;  Service: Thoracic;  Laterality: N/A;   HIP PINNING,CANNULATED Left 02/19/2023   Procedure: PERCUTANEOUS FIXATION OF FEMORAL NECK;  Surgeon: Tobie Priest, MD;  Location: [facility] ORS;  Service: Orthopedics;  Laterality: Left;   I & D EXTREMITY Right 04/29/2017   Procedure: IRRIGATION AND DEBRIDEMENT EXTREMITY;  Surgeon: Shelva Dunnings, MD;  Location: [facility] ORS;  Service: General;  Laterality:  Right;   IRRIGATION AND DEBRIDEMENT ABSCESS Left 04/29/2017   Procedure: IRRIGATION AND DEBRIDEMENT Scrotal ABSCESS;  Surgeon: Shelva Dunnings, MD;  Location: [facility] ORS;  Service: General;  Laterality: Left;   LEFT HEART CATH AND CORONARY ANGIOGRAPHY N/A 11/05/2023   Procedure: LEFT HEART CATH AND CORONARY ANGIOGRAPHY;  Surgeon: Mady Bruckner, MD;  Location: [facility] INVASIVE CV LAB;  Service: Cardiovascular;  Laterality: N/A;   POLYPECTOMY  07/01/2023   Procedure: POLYPECTOMY;  Surgeon: Therisa Bi, MD;  Location: [facility] ENDOSCOPY;  Service: Gastroenterology;;   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) 01/20/2024   Anxiety 12/14/2023   Overweight (BMI 25.0-29.9) 11/23/2023   Chronic heart failure with preserved ejection fraction (HFpEF) (HCC) 11/23/2023   BPH (benign prostatic hyperplasia) 11/23/2023   Unstable angina (HCC) 11/10/2023   Closed T10 spinal fracture (HCC) 11/06/2023   Closed tricolumnar fracture of thoracic vertebra (HCC) 11/06/2023   Thoracic spine instability 11/06/2023   Chronic bilateral low back pain with left-sided sciatica 07/07/2023   Neuropathy 07/07/2023   Hx of colonic polyps 07/01/2023   Adenomatous polyp of colon 07/01/2023   Erectile disorder 01/22/2023   Coronary artery disease involving native coronary artery of native heart without angina pectoris 09/09/2022   Varicose veins of left lower extremity with  inflammation 01/03/2022   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) 09/18/2021   Prediabetes 11/08/2020   Stage 3a chronic kidney disease (HCC) 11/08/2020   Seasonal allergies 11/08/2020   Rhinosinusitis 11/08/2020   Anticoagulant disorder (HCC) 11/07/2020   Senile purpura (HCC) 11/07/2020   MDD (major depressive disorder), recurrent episode, mild (HCC) 09/12/2019   Other spondylosis with radiculopathy, lumbar region 02/16/2019   Osteoporosis 10/12/2018   Chronic venous insufficiency 10/07/2018   Lymphedema 10/07/2018   SLE glomerulonephritis syndrome, 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HCC) 03/03/2018   Syncope and collapse 12/29/2017   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) 10/29/2016   Hyperlipidemia LDL goal <70 01/15/2016   Uncontrolled hypertension 01/15/2016   Obesity (BMI 30.0-34.9) 01/15/2016   Long term current use of anticoagulant 10/04/2015   Systemic lupus erythematosus (HCC) 05/30/2015   COPD, moderate (HCC) 06/27/2014   Shortness of breath 06/27/2014   History of pulmonary embolism 12/11/2013   Nodule of right lung 12/11/2013   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   Cystic disease of liver 08/21/2013  PCP: [name], PA-C REFERRING PROVIDER: Clois Fret, MD REFERRING DIAG: [id number] (ICD-10-CM) - Cervical radiculopathy M54.50,G89.29 (ICD-10-CM) - Chronic bilateral low back pain without sciatica  Rationale for Evaluation and Treatment: Rehabilitation  THERAPY DIAG:  Cervicalgia  Radiculopathy, cervical region  Other low back pain  Difficulty in walking, not elsewhere classified  Radiculopathy, lumbar region  ONSET DATE: November 2024  SUBJECTIVE:                                                                                                                                                                                           SUBJECTIVE STATEMENT: Neck is a 5/10, back is a 6/10 R shoulder  is a 7/10, L shoulder is about a 3-4/10 currently.  Was a little sore after last session. Was ok. Some days pain is awful, other days, he feels better than other days.   Got and injection in his R shoulder which helped temporarily.    PERTINENT HISTORY:  Cervicalgia, low back pain. Pt also reports R > L C5/C6 dermatome to elbow bilaterally. Pt has a difficult time turning his head and looking up. No neck surgery. Had Thoracic and lumbar spine fusion surgery. Had mid back surgery (thoracic) November 2024 due to his fall. Neck pain is more recent. Neck and shoulder pain began November 2024 after his thoracic surgery. Usually sleeps on his L side but is currently difficulty to do secondary to B shoulder and neck pain, so pt is not sleeping well. Neck pain is the same since November 2024. Pt tends to have issues getting dizzy and passes out when he is standing and exerting himself  Vision gets blurry and splotchy. Room feels like it is waving around, pt is not walking on a stable surface, feels like he is walking on a boat and not having sea legs.   The IVC filter was removed.  Still has L LE DVT. Pt states taking Elequis. L LE DVT is still being treated.   No latex band   PAIN:  Are you having pain? 5/10 pain in bilat UT area of neck and shoulders Rt> Lt.   PRECAUTIONS:  -dizzy and passes out when he is standing and exerting himself; chronic orthostatic hypotension; Lt LE DVT, fall risk  RED FLAGS: Bowel or bladder incontinence: No and Cauda equina syndrome: No   WEIGHT BEARING RESTRICTIONS: No  FALLS:  Has patient fallen in last 6 months? Yes. Number of falls 2 (original fall was when pt had a heart attack November 2024 and hurt his back. February 22, 2024, pt passed out and fell into the bath tub.   LIVING ENVIRONMENT: Lives  with: lives alone Lives in: House/apartment Stairs: No Has following equipment at home: Single point cane and Walker - 2 wheeled  OCCUPATION: Not currently  working  PLOF: Needs assistance with homemaking   PATIENT GOALS: be able to do everything better.   NEXT MD VISIT: Yes but does not know when.   OBJECTIVE:  Note: Objective measures were completed at Evaluation unless otherwise noted.  DIAGNOSTIC FINDINGS:  MR CERVICAL SPINE WO CONTRAST 02/26/2024  Narrative & Impression  CLINICAL DATA:  Myelopathy, acute, cervical spine   EXAM: MRI CERVICAL SPINE WITHOUT CONTRAST   TECHNIQUE: Multiplanar, multisequence MR imaging of the cervical spine was performed. No intravenous contrast was administered.   COMPARISON:  CT of the cervical spine November 05, 2023.   FINDINGS: Alignment: No substantial sagittal subluxation.   Vertebrae: No evidence of acute fracture, evidence of discitis, or suspicious bone lesion. Benign vertebral venous malformation at T3. Mild T1-T2 height loss, unchanged.   Cord: Normal cord signal.  Neck   Posterior Fossa, vertebral arteries, paraspinal tissues: Visualized vertebral artery flow voids are maintained. No paraspinal edema.   Disc levels:   C2-C3: No significant disc protrusion, foraminal stenosis, or canal stenosis.   C3-C4: No significant disc protrusion, foraminal stenosis, or canal stenosis.   C4-C5: Right eccentric posterior disc osteophyte complex with right greater than left facet and uncovertebral hypertrophy. Resulting mild right greater than left foraminal stenosis. Mild canal stenosis.   C5-C6: Posterior disc osteophyte complex ligamentum flavum thickening and right greater than left facet and uncovertebral hypertrophy. Resulting mild-to-moderate bilateral foraminal stenosis. Mild to moderate canal stenosis.   C6-C7: Posterior disc osteophyte complex with bilateral facet and uncovertebral hypertrophy. Resulting moderate bilateral foraminal stenosis. Mild canal stenosis.   C7-T1: No significant disc protrusion, foraminal stenosis, or canal stenosis.   IMPRESSION: 1. At C6-C7,  moderate bilateral foraminal stenosis and mild canal stenosis. 2. At C5-C6, mild to moderate canal and bilateral foraminal stenosis. 3. At C4-C5, mild canal and bilateral foraminal stenosis.     Electronically Signed   By: Gilmore GORMAN Molt M.D.   On: 02/27/2024 02:42         PATIENT SURVEYS:  Neck Disability Index score: 41/50 (82%) (05/02/2024)  COGNITION: Overall cognitive status: Within functional limits for tasks assessed     SENSATION:   MUSCLE LENGTH:   POSTURE:   PALPATION: TTP to posterior neck   CERVICAL ROM:   Active ROM A/PROM (deg) eval  Flexion WFL with posterior neck pain  Extension Very limited with pain, movement preference around C5/C6 area  Right lateral flexion Very limited with pain  Left lateral flexion Very limited with pain  Right rotation 20 degrees with pain  Left rotation 22 degrees with pain   (Blank rows = not tested)  UPPER EXTREMITY MMT:  MMT Right eval Left eval  Shoulder flexion 3+ with neck and shoulder and arm pain 4- with pain at available range.   Shoulder extension    Shoulder abduction    Shoulder adduction    Shoulder extension    Shoulder internal rotation    Shoulder external rotation    Middle trapezius    Lower trapezius (seated manually resisted) 3+ with neck pain 3+ with neck pain  Elbow flexion    Elbow extension    Wrist flexion    Wrist extension    Wrist ulnar deviation    Wrist radial deviation    Wrist pronation    Wrist supination    Grip strength     (  Blank rows = not tested)  UPPER EXTREMITY ROM:  Active ROM Right eval Left eval  Shoulder flexion 55 (scaption) 55 (scaption)  Shoulder extension    Shoulder abduction    Shoulder adduction    Shoulder extension    Shoulder internal rotation    Shoulder external rotation    Elbow flexion    Elbow extension    Wrist flexion    Wrist extension    Wrist ulnar deviation    Wrist radial deviation    Wrist pronation    Wrist  supination     (Blank rows = not tested)    LUMBAR ROM:   AROM eval  Flexion   Extension   Right lateral flexion   Left lateral flexion   Right rotation   Left rotation    (Blank rows = not tested)  LOWER EXTREMITY ROM:     Passive  Right eval Left eval  Hip flexion    Hip extension    Hip abduction    Hip adduction    Hip internal rotation    Hip external rotation    Knee flexion    Knee extension    Ankle dorsiflexion    Ankle plantarflexion    Ankle inversion    Ankle eversion     (Blank rows = not tested)  LOWER EXTREMITY MMT:    MMT Right eval Left eval  Hip flexion    Hip extension    Hip abduction    Hip adduction    Hip internal rotation    Hip external rotation    Knee flexion    Knee extension    Ankle dorsiflexion    Ankle plantarflexion    Ankle inversion    Ankle eversion     (Blank rows = not tested)  GAIT: Distance walked: 30 ft Assistive device utilized: None Level of assistance: SBA Comments: antalgic, decreased stance R LE, forward flexed, decreased gait speed  TREATMENT DATE: 08/24/2024    Therapeutic exercise At start of session: Blood pressure L arm sitting, mechanically taken, normal cuff: 154/87, HR 64 SpO2 100 %, room air  Seated   B shoulder extension yellow band 6x6   B shoulder ER yellow band with scapular retraction 10x3  Sitting with hips less than 90 degrees flexion   Manually resisted clamshell isometrics 10x2 with 5 second holds   Seated clamshell with green band resistance 10x3   Hip adduction isometrics, small blue ball squeeze 10x3 with 5 second holds  Improved exercise technique, movement at target joints, use of target muscles after mod verbal, visual, tactile cues.          PATIENT EDUCATION:  Education details: POC Person educated: Patient Education method: Explanation Education comprehension: verbalized understanding  HOME EXERCISE PROGRAM: Pt was recommended to press his tongue at  the roof of his mouth to activate his anterior cervical muscles throughout the day. Pt demonstrated and verbalized understanding.    Access Code: [email or web address] URL: [email or web address] Date: 05/10/2024 Prepared by: Emil Glassman  Exercises - Seated Cervical Flexion AROM  - 2 x daily - 7 x weekly - 3 sets - 5 reps - Seated Transversus Abdominis Bracing  - 1 x daily - 7 x weekly - 2 -3 sets - 10 reps - 5 seconds hold - Seated Chin Tuck with Neck Elongation  - 2-3 x daily - 7 x weekly - 2-3 sets - 5 reps - Seated Cervical Sidebending AROM  - 3 x daily - 7 x weekly -  4 sets - 5 reps  - Seated Transversus Abdominis Bracing  - 3 x daily - 7 x weekly - 3 sets - 10 reps - Seated Gluteal Sets  - 3 x daily - 7 x weekly - 3 sets - 10 reps - Supine Shoulder Flexion Extension Full Range AROM  - 3 x daily - 7 x weekly - 3 sets - 5 reps - Seated Shoulder Extension and Scapular Retraction with Resistance  - 1 x daily - 7 x weekly - 6 sets - 5 reps Yellow band - Standing Shoulder External Rotation with Resistance  - 1 x daily - 7 x weekly - 3 sets - 10 reps Yellow band - Seated Hip Abduction with Resistance  - 1 x daily - 7 x weekly - 3 sets - 10 reps  Green band  - Seated Hip Adduction Isometrics with Ball  - 1 x daily - 7 x weekly - 3 sets - 10 reps - 5 seconds hold  ASSESSMENT:  CLINICAL IMPRESSION: Added resisted exercises to improve strength while promoting thoracic extension, promoting posterior glide of humeral head to decrease neck and shoulder pain. Also worked on hip strength to promote ability to ambulate with decreased stress to low back. Fair tolerance to today's session. Pt will benefit from continued skilled physical therapy services to decrease pain, improve strength and function.     OBJECTIVE IMPAIRMENTS: Abnormal gait, decreased balance, decreased endurance, difficulty walking, decreased ROM, decreased strength, dizziness, impaired UE functional use, improper body  mechanics, postural dysfunction, and pain.   ACTIVITY LIMITATIONS: carrying, lifting, bending, sitting, standing, squatting, sleeping, stairs, transfers, bed mobility, bathing, toileting, dressing, reach over head, hygiene/grooming, locomotion level, and caring for others  PARTICIPATION LIMITATIONS:   PERSONAL FACTORS: Age, Fitness, Past/current experiences, Time since onset of injury/illness/exacerbation, and 3+ comorbidities: DVT, depression, dyspnea, heart attack, HTN, osteoporosis, are also affecting patient's functional outcome.   REHAB POTENTIAL: Fair    CLINICAL DECISION MAKING: Stable/uncomplicated  EVALUATION COMPLEXITY: Low   GOALS: Goals reviewed with patient? Yes  SHORT TERM GOALS: Target date: 05/13/2024  Pt will be independent with his initial HEP to improve strength, decrease pain, improve ability to lift, look around as well as ambulate and perform standing tasks with less difficulty and pain. Baseline: Pt has not yet officially started his initial HEP (05/02/2024) Goal status: ACHIEVED   LONG TERM GOALS: Target date: 07/01/2024  Pt will have a decrease in neck pain to 5/10 or less at worst to promote ability to look around more comfortably.  Baseline: 9/10 neck pain at worst for the past 3 months (05/02/2024); 6/10 at most for 7 days (06/02/2024); 06/14/24: 7-8/10 ; 6/10 at most for the past 7 days (06/27/2024); 6/10 on average for the past 7 days, 7/10 at most for the past 7 days, pt taking hydrocodone  again (07/28/2024) Goal status: ONGOING  2.  Pt will have a decrease on low back pain to 4/10 or less at worst to promote ability to tolerate sitting, standing, walking and performing standing tasks more comfortably for his back.  Baseline: 8/10 low back pain at worst for past 7 days; 6/10 worst pain in past week 06/14/24; 7/10 at most for the past 7 days (06/27/2024); 6/10 back pain at most for the past 7 days (07/28/2024) Goal status: PROGRESSING  3.  Pt will improve his Neck  Disability Index (NDI) score by at least 20% as a demonstration of improved function.  Baseline: Neck Disability Index score: 41/50 (82%) (05/02/2024); 06/14/24: 58%  NDI Goal status: ACHIEVED   4.  Pt will improve his cervical rotation AROM to 50 degrees or more to promote ability to look around more comfortably. Baseline:  Active ROM A/PROM (deg) eval AROM (06/02/2024) AROM (07/28/2024)  Right rotation 20 degrees with pain 30 degrees with pain 28 with pain  Left rotation 22 degrees with pain 35 degrees with pain 23 with pain   (05/02/2024) Goal status: ONGOING  5.  Pt will improve B shoulder flexion AROM to 100 degrees or more to promote ability to reach as well as lift more comfortably.  Baseline:  Active ROM Right eval Left eval R (06/02/2024) L (06/02/2024) R (06/27/2024) L  (06/27/2024) R (07/28/2024) L (07/28/2024)  Shoulder flexion 55 (scaption) 55 (scaption) 44  (Scaption) 65  (Scaption) 56 degrees with R upper trap and lateral shoulder and arm pain to elbow.  83 degrees with anterior shoulder and arm pain 62 with pain 85 with pain at end range   (05/02/2024)  Goal status: PROGRESSING  6.  Pt will improve his shoulder flexion and lower trap strength by at least 1/2 MMT grade to promote ability to raise his arms as well as lift with less difficulty.  Baseline:  MMT Right eval Left eval R (06/27/2024) L (06/27/2024) R (07/28/2024) L (07/28/2024)  Shoulder flexion 3+ with neck and shoulder and arm pain 4- with pain at available range.  4-/5 manual resistance at available range with R UE pain to hand 4/5 manual resistance at available range 4-/5 manual resistance at available range with  pain  4/5 manual resistance at available range  Lower trapezius (seated manually resisted) 3+ with neck pain 3+ with neck pain 3+ 4 3+ with pain 4 with pain   (05/02/2024) Goal status: IN PROGRESS   PLAN:  PT FREQUENCY: 1-2x/week  PT DURATION: 8 weeks  PLANNED INTERVENTIONS: 97110-Therapeutic  exercises, 97530- Therapeutic activity, [id number]- Neuromuscular re-education, 97535- Self Care, [id number]- Manual therapy, [id number]- Gait training, [phone]- Electrical stimulation (unattended), 02966- Ionotophoresis 4mg /ml Dexamethasone , Patient/Family education, and Balance training.  PLAN FOR NEXT SESSION: posture, B scapular, anterior cervical, trunk and glute strengthening, mechanics, manual techniques, modalities PRN    Bethel Sirois, PT, DPT 08/24/2024, 3:27 PM

## 2024-08-25 ENCOUNTER — Ambulatory Visit (INDEPENDENT_AMBULATORY_CARE_PROVIDER_SITE_OTHER): Admitting: Nurse Practitioner

## 2024-08-25 ENCOUNTER — Encounter (INDEPENDENT_AMBULATORY_CARE_PROVIDER_SITE_OTHER): Payer: Self-pay

## 2024-08-25 VITALS — BP 152/80 | HR 77 | Resp 18

## 2024-08-25 DIAGNOSIS — M1652 Unilateral post-traumatic osteoarthritis, left hip: Secondary | ICD-10-CM | POA: Diagnosis not present

## 2024-08-25 DIAGNOSIS — F332 Major depressive disorder, recurrent severe without psychotic features: Secondary | ICD-10-CM | POA: Diagnosis not present

## 2024-08-25 DIAGNOSIS — I890 Lymphedema, not elsewhere classified: Secondary | ICD-10-CM

## 2024-08-25 DIAGNOSIS — M7062 Trochanteric bursitis, left hip: Secondary | ICD-10-CM | POA: Diagnosis not present

## 2024-08-25 NOTE — Progress Notes (Signed)
 History of Present Illness  There is no documented history at this time  Assessments & Plan   There are no diagnoses linked to this encounter.    Additional instructions  Subjective:  Patient presents with venous ulcer of the Left lower extremity.    Procedure:  3 layer unna wrap was placed Left lower extremity.   Plan:   Follow up in one week.

## 2024-08-26 DIAGNOSIS — Z7960 Long term (current) use of unspecified immunomodulators and immunosuppressants: Secondary | ICD-10-CM | POA: Diagnosis not present

## 2024-08-26 DIAGNOSIS — M2550 Pain in unspecified joint: Secondary | ICD-10-CM | POA: Diagnosis not present

## 2024-08-26 DIAGNOSIS — M3214 Glomerular disease in systemic lupus erythematosus: Secondary | ICD-10-CM | POA: Diagnosis not present

## 2024-08-26 DIAGNOSIS — M329 Systemic lupus erythematosus, unspecified: Secondary | ICD-10-CM | POA: Diagnosis not present

## 2024-08-28 ENCOUNTER — Encounter (INDEPENDENT_AMBULATORY_CARE_PROVIDER_SITE_OTHER): Payer: Self-pay | Admitting: Nurse Practitioner

## 2024-08-30 ENCOUNTER — Ambulatory Visit

## 2024-08-30 ENCOUNTER — Encounter: Payer: Self-pay | Admitting: Nurse Practitioner

## 2024-08-30 DIAGNOSIS — M542 Cervicalgia: Secondary | ICD-10-CM

## 2024-08-30 DIAGNOSIS — M5412 Radiculopathy, cervical region: Secondary | ICD-10-CM

## 2024-08-30 NOTE — Therapy (Signed)
 OUTPATIENT PHYSICAL THERAPY  Discharge Summary    Patient Name: Jonathon Snow MRN: [id number] DOB:November 21, 1961, 63 y.o., male Today's Date: 08/30/2024  END OF SESSION:  PT End of Session - 08/30/24 1350     Visit Number 18    Number of Visits 33    Date for PT Re-Evaluation 08/26/24    Authorization Type HB [id number] for 4 PT vsts from 8/12-9/10    Authorization - Number of Visits 4    PT Start Time 1350    PT Stop Time 1359    PT Time Calculation (min) 9 min    Activity Tolerance Patient tolerated treatment well;Patient limited by pain    Behavior During Therapy [facility] for tasks assessed/performed                           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   Overview:  superior sagittal sinus, left transverse sinus and cortical veins    Closed left hip fracture, initial encounter (HCC) 02/18/2023   COVID-19 virus infection 12/2020   Depression    DVT (deep venous thrombosis) (HCC)    Dyspnea    GERD (gastroesophageal reflux disease)    Head injury 11/05/2023   Heart attack (HCC) 11/05/2023   Heel spur, left 02/16/2019   Heel spur, right 02/16/2019   Hematoma of groin 11/08/2023   Herpes zoster infection of lumbar region 02/20/2020   History of DVT (deep vein thrombosis) 03/22/2020   History of kidney stones    HTN (hypertension)    Hyperlipidemia    Hypertension    Long term current use of systemic steroids 11/08/2020   Lupus    Lymphedema 10/07/2018   Morbid obesity (HCC)    Opiate abuse, episodic (HCC) 02/26/2018   Osteoporosis    Pain and swelling of right lower extremity 02/03/2023   Pneumonia    PONV (postoperative nausea and vomiting)    Postphlebitic syndrome with ulcer, 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    Presence of IVC filter 03/22/2020   Removed   Pulmonary embolism (HCC)    Renal disorder    Stage III   Severe episode of recurrent major depressive disorder, 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   Shock circulatory (HCC) 11/06/2023   STEMI (ST elevation myocardial infarction) (HCC) 11/05/2023   Past Surgical History:  Procedure Laterality Date   ANKLE SURGERY Right    BACK SURGERY     BRONCHIAL WASHINGS N/A 11/01/2021   Procedure: BRONCHIAL WASHINGS;  Surgeon: Parris Manna, MD;  Location: [facility] ORS;  Service: Thoracic;  Laterality: N/A;   COLONOSCOPY WITH PROPOFOL  N/A 05/28/2020   Procedure: COLONOSCOPY WITH PROPOFOL ;  Surgeon: Therisa Bi, MD;  Location: [facility] ENDOSCOPY;  Service: Endoscopy;  Laterality: N/A;   COLONOSCOPY WITH PROPOFOL  N/A 07/01/2023   Procedure: COLONOSCOPY WITH PROPOFOL ;  Surgeon: Therisa Bi, MD;  Location: [facility] ENDOSCOPY;  Service: Gastroenterology;  Laterality: N/A;   CORONARY ULTRASOUND/IVUS N/A 11/05/2023   Procedure: Coronary Ultrasound/IVUS;  Surgeon: Mady Bruckner, MD;  Location: [facility] INVASIVE CV LAB;  Service: Cardiovascular;  Laterality: N/A;   CORONARY/GRAFT ACUTE MI REVASCULARIZATION N/A 11/05/2023   Procedure: Coronary/Graft Acute MI Revascularization;  Surgeon: Mady Bruckner, MD;  Location: [facility] INVASIVE CV LAB;  Service: Cardiovascular;  Laterality: N/A;   CYST  EXCISION  92 or 93    Liver cyst removal [facility]   FLEXIBLE BRONCHOSCOPY N/A 11/01/2021   Procedure: FLEXIBLE BRONCHOSCOPY;  Surgeon: Parris Manna, MD;  Location: [facility] ORS;  Service: Thoracic;  Laterality: N/A;   HIP PINNING,CANNULATED Left 02/19/2023   Procedure: PERCUTANEOUS FIXATION OF FEMORAL NECK;  Surgeon: Tobie Priest, MD;  Location: [facility] ORS;  Service: Orthopedics;  Laterality: Left;   I & D EXTREMITY Right 04/29/2017   Procedure: IRRIGATION AND DEBRIDEMENT EXTREMITY;  Surgeon: Shelva Dunnings, MD;  Location: [facility] ORS;  Service: General;  Laterality: Right;   IRRIGATION  AND DEBRIDEMENT ABSCESS Left 04/29/2017   Procedure: IRRIGATION AND DEBRIDEMENT Scrotal ABSCESS;  Surgeon: Shelva Dunnings, MD;  Location: [facility] ORS;  Service: General;  Laterality: Left;   LEFT HEART CATH AND CORONARY ANGIOGRAPHY N/A 11/05/2023   Procedure: LEFT HEART CATH AND CORONARY ANGIOGRAPHY;  Surgeon: Mady Bruckner, MD;  Location: [facility] INVASIVE CV LAB;  Service: Cardiovascular;  Laterality: N/A;   POLYPECTOMY  07/01/2023   Procedure: POLYPECTOMY;  Surgeon: Therisa Bi, MD;  Location: [facility] ENDOSCOPY;  Service: Gastroenterology;;   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) 01/20/2024   Anxiety 12/14/2023   Overweight (BMI 25.0-29.9) 11/23/2023   Chronic heart failure with preserved ejection fraction (HFpEF) (HCC) 11/23/2023   BPH (benign prostatic hyperplasia) 11/23/2023   Unstable angina (HCC) 11/10/2023   Closed T10 spinal fracture (HCC) 11/06/2023   Closed tricolumnar fracture of thoracic vertebra (HCC) 11/06/2023   Thoracic spine instability 11/06/2023   Chronic bilateral low back pain with left-sided sciatica 07/07/2023   Neuropathy 07/07/2023   Hx of colonic polyps 07/01/2023   Adenomatous polyp of colon 07/01/2023   Erectile disorder 01/22/2023   Coronary artery disease involving native coronary artery of native heart without angina pectoris 09/09/2022   Varicose 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 01/03/2022    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) 09/18/2021   Prediabetes 11/08/2020   Stage 3a chronic kidney disease (HCC) 11/08/2020   Seasonal allergies 11/08/2020   Rhinosinusitis 11/08/2020   Anticoagulant disorder (HCC) 11/07/2020   Senile purpura (HCC) 11/07/2020   MDD (major depressive disorder), recurrent episode, mild (HCC) 09/12/2019   Other spondylosis with radiculopathy, lumbar region 02/16/2019   Osteoporosis 10/12/2018   Chronic venous insufficiency 10/07/2018   Lymphedema 10/07/2018   SLE glomerulonephritis syndrome, 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HCC) 03/03/2018   Syncope and collapse 12/29/2017   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) 10/29/2016   Hyperlipidemia LDL goal <70 01/15/2016   Uncontrolled hypertension 01/15/2016   Obesity (BMI 30.0-34.9) 01/15/2016   Long term current use of anticoagulant 10/04/2015   Systemic lupus erythematosus (HCC) 05/30/2015   COPD, moderate (HCC) 06/27/2014   Shortness of breath 06/27/2014   History of pulmonary embolism 12/11/2013   Nodule of right lung 12/11/2013   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   Cystic disease of liver 08/21/2013    PCP: [name],  PA-C REFERRING PROVIDER: Clois Fret, MD REFERRING DIAG: [phone] (ICD-10-CM) - Cervical radiculopathy M54.50,G89.29 (ICD-10-CM) - Chronic bilateral low back pain without sciatica  Rationale for Evaluation and Treatment: Rehabilitation  THERAPY DIAG:  Cervicalgia  Radiculopathy, cervical region  ONSET DATE: November 2024  SUBJECTIVE:                                                                                                                                                                                           SUBJECTIVE STATEMENT: As long as he keeps it in a safe range of motion, about a 4/10 neck pain. R shoulder is about a 5/10 if he keeps it in a safe range of motion. L shoulder is a 3/10, low  back is a 5/10 currently. Was a little sore after last session. The last session helped his neck, not his back. Has injections in his hip at [facility] and shoulder at [facility] pain management last week.   Supposed to be doing a different kind of PT September 07, 2024 for dizziness and balance.       PERTINENT HISTORY:  Cervicalgia, low back pain. Pt also reports R > L C5/C6 dermatome to elbow bilaterally. Pt has a difficult time turning his head and looking up. No neck surgery. Had Thoracic and lumbar spine fusion surgery. Had mid back surgery (thoracic) November 2024 due to his fall. Neck pain is more recent. Neck and shoulder pain began November 2024 after his thoracic surgery. Usually sleeps on his L side but is currently difficulty to do secondary to B shoulder and neck pain, so pt is not sleeping well. Neck pain is the same since November 2024. Pt tends to have issues getting dizzy and passes out when he is standing and exerting himself  Vision gets blurry and splotchy. Room feels like it is waving around, pt is not walking on a stable surface, feels like he is walking on a boat and not having sea legs.   The IVC filter was removed.  Still has L LE DVT. Pt states taking Elequis. L LE DVT is still being treated.   No latex band   PAIN:  Are you having pain? 5/10 pain in bilat UT area of neck and shoulders Rt> Lt.   PRECAUTIONS:  -dizzy and passes out when he is standing and exerting himself; chronic orthostatic hypotension; Lt LE DVT, fall risk  RED FLAGS: Bowel or bladder incontinence: No and Cauda equina syndrome: No   WEIGHT BEARING RESTRICTIONS: No  FALLS:  Has patient fallen in last 6 months? Yes. Number of falls 2 (original fall was when pt had a heart attack  November 2024 and hurt his back. February 22, 2024, pt passed out and fell into the bath tub.   LIVING ENVIRONMENT: Lives with: lives alone Lives in: House/apartment Stairs: No Has following equipment at home: Single  point cane and Environmental consultant - 2 wheeled  OCCUPATION: Not currently working  PLOF: Needs assistance with homemaking   PATIENT GOALS: be able to do everything better.   NEXT MD VISIT: Yes but does not know when.   OBJECTIVE:  Note: Objective measures were completed at Evaluation unless otherwise noted.  DIAGNOSTIC FINDINGS:  MR CERVICAL SPINE WO CONTRAST 02/26/2024  Narrative & Impression  CLINICAL DATA:  Myelopathy, acute, cervical spine   EXAM: MRI CERVICAL SPINE WITHOUT CONTRAST   TECHNIQUE: Multiplanar, multisequence MR imaging of the cervical spine was performed. No intravenous contrast was administered.   COMPARISON:  CT of the cervical spine November 05, 2023.   FINDINGS: Alignment: No substantial sagittal subluxation.   Vertebrae: No evidence of acute fracture, evidence of discitis, or suspicious bone lesion. Benign vertebral venous malformation at T3. Mild T1-T2 height loss, unchanged.   Cord: Normal cord signal.  Neck   Posterior Fossa, vertebral arteries, paraspinal tissues: Visualized vertebral artery flow voids are maintained. No paraspinal edema.   Disc levels:   C2-C3: No significant disc protrusion, foraminal stenosis, or canal stenosis.   C3-C4: No significant disc protrusion, foraminal stenosis, or canal stenosis.   C4-C5: Right eccentric posterior disc osteophyte complex with right greater than left facet and uncovertebral hypertrophy. Resulting mild right greater than left foraminal stenosis. Mild canal stenosis.   C5-C6: Posterior disc osteophyte complex ligamentum flavum thickening and right greater than left facet and uncovertebral hypertrophy. Resulting mild-to-moderate bilateral foraminal stenosis. Mild to moderate canal stenosis.   C6-C7: Posterior disc osteophyte complex with bilateral facet and uncovertebral hypertrophy. Resulting moderate bilateral foraminal stenosis. Mild canal stenosis.   C7-T1: No significant disc protrusion,  foraminal stenosis, or canal stenosis.   IMPRESSION: 1. 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 2. At C5-C6, mild to moderate canal and bilateral foraminal stenosis. 3. At C4-C5, mild canal and bilateral foraminal stenosis.     Electronically Signed   By: Gilmore GORMAN Molt M.D.   On: 02/27/2024 02:42         PATIENT SURVEYS:  Neck Disability Index score: 41/50 (82%) (05/02/2024)  COGNITION: Overall cognitive status: Within functional limits for tasks assessed     SENSATION:   MUSCLE LENGTH:   POSTURE:   PALPATION: TTP to posterior neck   CERVICAL ROM:   Active ROM A/PROM (deg) eval  Flexion WFL with posterior neck pain  Extension Very limited with pain, movement preference around C5/C6 area  Right lateral flexion Very limited with pain  Left lateral flexion Very limited with pain  Right rotation 20 degrees with pain  Left rotation 22 degrees with pain   (Blank rows = not tested)  UPPER EXTREMITY MMT:  MMT Right eval Left eval  Shoulder flexion 3+ with neck and shoulder and arm pain 4- with pain at available range.   Shoulder extension    Shoulder abduction    Shoulder adduction    Shoulder extension    Shoulder internal rotation    Shoulder external rotation    Middle trapezius    Lower trapezius (seated manually resisted) 3+ with neck pain 3+ with neck pain  Elbow flexion    Elbow extension    Wrist flexion    Wrist extension    Wrist ulnar deviation  Wrist radial deviation    Wrist pronation    Wrist supination    Grip strength     (Blank rows = not tested)  UPPER EXTREMITY ROM:  Active ROM Right eval Left eval  Shoulder flexion 55 (scaption) 55 (scaption)  Shoulder extension    Shoulder abduction    Shoulder adduction    Shoulder extension    Shoulder internal rotation    Shoulder external rotation    Elbow flexion    Elbow extension    Wrist flexion    Wrist extension    Wrist ulnar  deviation    Wrist radial deviation    Wrist pronation    Wrist supination     (Blank rows = not tested)    LUMBAR ROM:   AROM eval  Flexion   Extension   Right lateral flexion   Left lateral flexion   Right rotation   Left rotation    (Blank rows = not tested)  LOWER EXTREMITY ROM:     Passive  Right eval Left eval  Hip flexion    Hip extension    Hip abduction    Hip adduction    Hip internal rotation    Hip external rotation    Knee flexion    Knee extension    Ankle dorsiflexion    Ankle plantarflexion    Ankle inversion    Ankle eversion     (Blank rows = not tested)  LOWER EXTREMITY MMT:    MMT Right eval Left eval  Hip flexion    Hip extension    Hip abduction    Hip adduction    Hip internal rotation    Hip external rotation    Knee flexion    Knee extension    Ankle dorsiflexion    Ankle plantarflexion    Ankle inversion    Ankle eversion     (Blank rows = not tested)  GAIT: Distance walked: 30 ft Assistive device utilized: None Level of assistance: SBA Comments: antalgic, decreased stance R LE, forward flexed, decreased gait speed          PATIENT EDUCATION:  Education details: POC Person educated: Patient Education method: Explanation Education comprehension: verbalized understanding  HOME EXERCISE PROGRAM: Pt was recommended to press his tongue at the roof of his mouth to activate his anterior cervical muscles throughout the day. Pt demonstrated and verbalized understanding.    Access Code: [email or web address] URL: [email or web address] Date: 05/10/2024 Prepared by: Emil Glassman  Exercises - Seated Cervical Flexion AROM  - 2 x daily - 7 x weekly - 3 sets - 5 reps - Seated Transversus Abdominis Bracing  - 1 x daily - 7 x weekly - 2 -3 sets - 10 reps - 5 seconds hold - Seated Chin Tuck with Neck Elongation  - 2-3 x daily - 7 x weekly - 2-3 sets - 5 reps - Seated Cervical Sidebending AROM  - 3 x daily - 7 x weekly -  4 sets - 5 reps  - Seated Transversus Abdominis Bracing  - 3 x daily - 7 x weekly - 3 sets - 10 reps - Seated Gluteal Sets  - 3 x daily - 7 x weekly - 3 sets - 10 reps - Supine Shoulder Flexion Extension Full Range AROM  - 3 x daily - 7 x weekly - 3 sets - 5 reps - Seated Shoulder Extension and Scapular Retraction with Resistance  - 1 x daily - 7 x weekly - 6 sets -  5 reps Yellow band - Standing Shoulder External Rotation with Resistance  - 1 x daily - 7 x weekly - 3 sets - 10 reps Yellow band - Seated Hip Abduction with Resistance  - 1 x daily - 7 x weekly - 3 sets - 10 reps  Green band  - Seated Hip Adduction Isometrics with Ball  - 1 x daily - 7 x weekly - 3 sets - 10 reps - 5 seconds hold  ASSESSMENT:  CLINICAL IMPRESSION: Pt demonstrates overall decreased neck pain by 2 points, decreased low back pain at worst by 2 points, improved function, and improved B shoulder flexion AROM since initial evaluation. Pt has made some progress with PT towards goals. Challenges to progress include complex medical history affecting treatment options. Skilled physical therapy services discharged with pt continuing with his exercises at home.    OBJECTIVE IMPAIRMENTS: Abnormal gait, decreased balance, decreased endurance, difficulty walking, decreased ROM, decreased strength, dizziness, impaired UE functional use, improper body mechanics, postural dysfunction, and pain.   ACTIVITY LIMITATIONS: carrying, lifting, bending, sitting, standing, squatting, sleeping, stairs, transfers, bed mobility, bathing, toileting, dressing, reach over head, hygiene/grooming, locomotion level, and caring for others  PARTICIPATION LIMITATIONS:   PERSONAL FACTORS: Age, Fitness, Past/current experiences, Time since onset of injury/illness/exacerbation, and 3+ comorbidities: DVT, depression, dyspnea, heart attack, HTN, osteoporosis, are also affecting patient's functional outcome.   REHAB POTENTIAL: Fair    CLINICAL  DECISION MAKING: Stable/uncomplicated  EVALUATION COMPLEXITY: Low   GOALS: Goals reviewed with patient? Yes  SHORT TERM GOALS: Target date: 05/13/2024  Pt will be independent with his initial HEP to improve strength, decrease pain, improve ability to lift, look around as well as ambulate and perform standing tasks with less difficulty and pain. Baseline: Pt has not yet officially started his initial HEP (05/02/2024) Goal status: ACHIEVED   LONG TERM GOALS: Target date: 07/01/2024  Pt will have a decrease in neck pain to 5/10 or less at worst to promote ability to look around more comfortably.  Baseline: 9/10 neck pain at worst for the past 3 months (05/02/2024); 6/10 at most for 7 days (06/02/2024); 06/14/24: 7-8/10 ; 6/10 at most for the past 7 days (06/27/2024); 6/10 on average for the past 7 days, 7/10 at most for the past 7 days, pt taking hydrocodone  again (07/28/2024); 7/10 at most for the past 7 days (08/30/2024) Goal status: ONGOING  2.  Pt will have a decrease on low back pain to 4/10 or less at worst to promote ability to tolerate sitting, standing, walking and performing standing tasks more comfortably for his back.  Baseline: 8/10 low back pain at worst for past 7 days; 6/10 worst pain in past week 06/14/24; 7/10 at most for the past 7 days (06/27/2024); 6/10 back pain at most for the past 7 days (07/28/2024), 6/10 at most for the past 7 days (08/30/2024) Goal status: PROGRESSING  3.  Pt will improve his Neck Disability Index (NDI) score by at least 20% as a demonstration of improved function.  Baseline: Neck Disability Index score: 41/50 (82%) (05/02/2024); 06/14/24: 58% NDI Goal status: ACHIEVED   4.  Pt will improve his cervical rotation AROM to 50 degrees or more to promote ability to look around more comfortably. Baseline:  Active ROM A/PROM (deg) eval AROM (06/02/2024) AROM (07/28/2024)  Right rotation 20 degrees with pain 30 degrees with pain 28 with pain  Left rotation 22 degrees with  pain 35 degrees with pain 23 with pain   (05/02/2024) Goal  status: ONGOING  5.  Pt will improve B shoulder flexion AROM to 100 degrees or more to promote ability to reach as well as lift more comfortably.  Baseline:  Active ROM Right eval Left eval R (06/02/2024) L (06/02/2024) R (06/27/2024) L  (06/27/2024) R (07/28/2024) L (07/28/2024)  Shoulder flexion 55 (scaption) 55 (scaption) 44  (Scaption) 65  (Scaption) 56 degrees with R upper trap and lateral shoulder and arm pain to elbow.  83 degrees with anterior shoulder and arm pain 62 with pain 85 with pain at end range   (05/02/2024)  Goal status: PROGRESSING  6.  Pt will improve his shoulder flexion and lower trap strength by at least 1/2 MMT grade to promote ability to raise his arms as well as lift with less difficulty.  Baseline:  MMT Right eval Left eval R (06/27/2024) L (06/27/2024) R (07/28/2024) L (07/28/2024)  Shoulder flexion 3+ with neck and shoulder and arm pain 4- with pain at available range.  4-/5 manual resistance at available range with R UE pain to hand 4/5 manual resistance at available range 4-/5 manual resistance at available range with  pain  4/5 manual resistance at available range  Lower trapezius (seated manually resisted) 3+ with neck pain 3+ with neck pain 3+ 4 3+ with pain 4 with pain   (05/02/2024) Goal status: IN PROGRESS   PLAN:  PT FREQUENCY: 1-2x/week  PT DURATION: 8 weeks  PLANNED INTERVENTIONS: 97110-Therapeutic exercises, 97530- Therapeutic activity, 97112- Neuromuscular re-education, 97535- Self Care, 02859- Manual therapy, [id number]- Gait training, [phone]- Electrical stimulation (unattended), 02966- Ionotophoresis 4mg /ml Dexamethasone , Patient/Family education, and Balance training.  PLAN FOR NEXT SESSION: posture, B scapular, anterior cervical, trunk and glute strengthening, mechanics, manual techniques, modalities PRN   Thank you for your referral.  Jacub Waiters, PT, DPT 08/30/2024, 2:10 PM

## 2024-08-31 ENCOUNTER — Ambulatory Visit

## 2024-08-31 ENCOUNTER — Ambulatory Visit (INDEPENDENT_AMBULATORY_CARE_PROVIDER_SITE_OTHER): Admitting: Nurse Practitioner

## 2024-08-31 ENCOUNTER — Encounter

## 2024-08-31 ENCOUNTER — Encounter (INDEPENDENT_AMBULATORY_CARE_PROVIDER_SITE_OTHER): Payer: Self-pay | Admitting: Nurse Practitioner

## 2024-08-31 VITALS — BP 157/96 | HR 92 | Resp 18

## 2024-08-31 DIAGNOSIS — I890 Lymphedema, not elsewhere classified: Secondary | ICD-10-CM

## 2024-08-31 NOTE — Progress Notes (Signed)
 History of Present Illness  There is no documented history at this time  Assessments & Plan   There are no diagnoses linked to this encounter.    Additional instructions  Subjective:  Patient presents with venous ulcer of the Left lower extremity.    Procedure:  3 layer unna wrap was placed Left lower extremity.   Plan:   Follow up in one week.

## 2024-09-01 DIAGNOSIS — F332 Major depressive disorder, recurrent severe without psychotic features: Secondary | ICD-10-CM | POA: Diagnosis not present

## 2024-09-04 ENCOUNTER — Encounter: Payer: Self-pay | Admitting: Family Medicine

## 2024-09-05 ENCOUNTER — Ambulatory Visit: Attending: Nurse Practitioner | Admitting: Nurse Practitioner

## 2024-09-05 ENCOUNTER — Encounter: Payer: Self-pay | Admitting: Nurse Practitioner

## 2024-09-05 VITALS — BP 137/77 | HR 63 | Temp 97.6°F | Resp 20 | Ht 74.0 in | Wt 220.0 lb

## 2024-09-05 DIAGNOSIS — M5412 Radiculopathy, cervical region: Secondary | ICD-10-CM | POA: Diagnosis not present

## 2024-09-05 DIAGNOSIS — Z981 Arthrodesis status: Secondary | ICD-10-CM | POA: Insufficient documentation

## 2024-09-05 DIAGNOSIS — Z79899 Other long term (current) drug therapy: Secondary | ICD-10-CM | POA: Diagnosis not present

## 2024-09-05 DIAGNOSIS — M47812 Spondylosis without myelopathy or radiculopathy, cervical region: Secondary | ICD-10-CM | POA: Insufficient documentation

## 2024-09-05 DIAGNOSIS — G894 Chronic pain syndrome: Secondary | ICD-10-CM | POA: Insufficient documentation

## 2024-09-05 MED ORDER — METHOCARBAMOL 1000 MG/10ML IJ SOLN
200.0000 mg | Freq: Once | INTRAMUSCULAR | Status: AC
  Administered 2024-09-05: 200 mg via INTRAMUSCULAR
  Filled 2024-09-05: qty 10

## 2024-09-05 MED ORDER — JOURNAVX 50 MG PO TABS: 50.0000 mg | ORAL_TABLET | Freq: Two times a day (BID) | ORAL | 0 refills | Status: AC

## 2024-09-05 MED ORDER — HYDROCODONE-ACETAMINOPHEN 5-325 MG PO TABS: 1.0000 | ORAL_TABLET | Freq: Three times a day (TID) | ORAL | 0 refills | Status: DC | PRN

## 2024-09-05 NOTE — Progress Notes (Signed)
 Nursing Pain Medication Assessment:  Safety precautions to be maintained throughout the outpatient stay will include: orient to surroundings, keep bed in low position, maintain call bell within reach at all times, provide assistance with transfer out of bed and ambulation.  Medication Inspection Compliance: Pill count conducted under aseptic conditions, in front of the patient. Neither the pills nor the bottle was removed from the patient's sight at any time. Once count was completed pills were immediately returned to the patient in their original bottle.  Medication: Hydrocodone /APAP Pill/Patch Count: 79 of 90 pills/patches remain Pill/Patch Appearance: Markings consistent with prescribed medication Bottle Appearance: Standard pharmacy container. Clearly labeled. Filled Date: 09 / 09 / 2025 Last Medication intake:  Today

## 2024-09-05 NOTE — Progress Notes (Signed)
 PROVIDER NOTE: Interpretation of information contained herein should be left to medically-trained personnel. Specific patient instructions are provided elsewhere under Patient Instructions section of medical record. This document was created in part using AI and STT-dictation technology, any transcriptional errors that may result from this process are unintentional.  Patient: Jonathon Snow  Service: E/M   PCP: [name], PA-C  DOB: 1961/01/11  DOS: 09/05/2024  Provider: Emmy MARLA Blanch, NP  MRN: [id number]  Delivery: Face-to-face  Specialty: Interventional Pain Management  Type: Established Patient  Setting: Ambulatory outpatient facility  Specialty designation: 09  Referring Prov.: [name], PA-C  Location: Outpatient office facility       History of present illness (HPI) Jonathon Snow, a 63 y.o. year old male, is here today because of his Cervical facet joint syndrome [M47.812]. Jonathon Snow primary complain today is Neck Pain (Neck Pain, radiating down bilateral shoulder worse on the right.  ) and Back Pain  Pertinent problems: Jonathon Snow  has Other spondylosis with radiculopathy, lumbar region; Overweight (BMI 25.0-29.9); Cervical radicular pain; History of thoracic spinal fusion (T7-T12); History of lumbar spinal fusion (L5-S1); and Chronic pain syndrome on their problem list.   Pain Assessment: Severity of Chronic pain is reported as a 6 /10. Location: Neck Right/Radiates down right shoulder in right arm and hand. Onset: More than a month ago. Quality: Constant, Aching, Stabbing. Timing: Constant. Modifying factor(s): Rest Eases. Vitals:  height is 6' 2 (1.88 m) and weight is 220 lb (99.8 kg). His temporal temperature is 97.6 F (36.4 C). His blood pressure is 137/77 and his pulse is 63. His respiration is 20 and oxygen saturation is 100%.  BMI: Estimated body mass index is 28.25 kg/m as calculated from the following:   Height as of this encounter: 6' 2 (1.88 m).   Weight as of  this encounter: 220 lb (99.8 kg).  Last encounter: 08/17/2024. Last procedure: 06/15/2024  Reason for encounter: medication management. No change in medical history since last visit.  Patient's pain is at baseline.  Patient continues multimodal pain regimen as prescribed.  States that it provides pain relief and improvement in functional status. The patient is currently taking Norco twice daily; however, he reports inadequate pain relief with 12-hour dosing interval.  I recommended adjusting the regimen to every 8 hours at the same dosage to provide better pain coverage until his interventional cervical treatment.  This adjustment is intended for short-term management only and not for long-term use.   The patient presents with complaint of neck pain and right shoulder pain radiating down to the right.  He has already discussed this with Dr. Marcelino, who recommended a cervical facet medial branch block.  However, the procedure requires him come off his Plavix  and Eliquis , or alternatively obtain cardiac clearance from his cardiology or vascular team.  Due to persistent neck pain, we discussed and proceed with the Robaxin  IM injection.  Pharmacotherapy Assessment   Hydrocodone -acetaminophen  (Norco/Vicodin) 5-325 mg tablet every 8 hours as needed for pain. MME=10  Monitoring: [place] PMP: PDMP reviewed during this encounter.       Pharmacotherapy: No side-effects or adverse reactions reported. Compliance: No problems identified. Effectiveness: Clinically acceptable.  Jonathon Snow, [place]  09/05/2024  8:17 AM  Sign when Signing Visit Nursing Pain Medication Assessment:  Safety precautions to be maintained throughout the outpatient stay will include: orient to surroundings, keep bed in low position, maintain call bell within reach at all times, provide assistance with transfer out of  bed and ambulation.  Medication Inspection Compliance: Pill count conducted under aseptic conditions, in front of the patient.  Neither the pills nor the bottle was removed from the patient's sight at any time. Once count was completed pills were immediately returned to the patient in their original bottle.  Medication: Hydrocodone /APAP Pill/Patch Count: 79 of 90 pills/patches remain Pill/Patch Appearance: Markings consistent with prescribed medication Bottle Appearance: Standard pharmacy container. Clearly labeled. Filled Date: 09 / 09 / 2025 Last Medication intake:  Today  UDS:  Summary  Date Value Ref Range Status  07/19/2024 FINAL  Final    Comment:    ==================================================================== Compliance Drug Analysis, Ur ==================================================================== Test                             Result       Flag       Units  Drug Present and Declared for Prescription Verification   Metoprolol                      PRESENT      EXPECTED  Drug Present not Declared for Prescription Verification   Tramadol                        166          UNEXPECTED ng/mg creat   O-Desmethyltramadol            1291         UNEXPECTED ng/mg creat    Source of tramadol  is a prescription medication. O-desmethyltramadol    is an expected metabolite of tramadol .  Drug Absent but Declared for Prescription Verification   Clonazepam                      Not Detected UNEXPECTED ng/mg creat   Sertraline                      Not Detected UNEXPECTED   Aripiprazole                    Not Detected UNEXPECTED ==================================================================== Test                      Result    Flag   Units      Ref Range   Creatinine              94               mg/dL      >=79 ==================================================================== Declared Medications:  The flagging and interpretation on this report are based on the  following declared medications.  Unexpected results may arise from  inaccuracies in the declared medications.   **Note: The testing  scope of this panel includes these medications:   Aripiprazole  (Abilify )  Clonazepam  (Klonopin )  Metoprolol   Sertraline  (Zoloft )   **Note: The testing scope of this panel does not include the  following reported medications:   Apixaban  (Eliquis )  Azelastine (Astelin)  Belimumab (Benlysta)  Carbidopa (Sinemet)  Clopidogrel  (Plavix )  Levodopa (Sinemet)  Midodrine  (Proamatine )  Montelukast  (Singulair )  Multivitamin  Mycophenolate  mofetil (Cellcept )  Oxybutynin  (Ditropan )  Pantoprazole  (Protonix )  Rosuvastatin  (Crestor )  Solifenacin  (Vesicare )  Vitamin B12  Vitamin D3 ==================================================================== For clinical consultation, please call [phone]. ====================================================================     No results found for: CBDTHCR No results found for: D8THCCBX No results found for: D9THCCBX  ROS  Constitutional: Denies any fever or chills Gastrointestinal: No reported hemesis, hematochezia, vomiting, or acute GI distress Musculoskeletal:  Neck Pain (Bilateral shoulers, arms, left leg and lower back)  Neurological: No reported episodes of acute onset apraxia, aphasia, dysarthria, agnosia, amnesia, paralysis, loss of coordination, or loss of consciousness  Medication Review  ARIPiprazole , Belimumab, HYDROcodone -acetaminophen , SYRINGE 3CC/25GX1, Suzetrigine , albuterol , apixaban , azelastine, busPIRone , carbidopa-levodopa, cholecalciferol , clonazePAM , clopidogrel , cyanocobalamin , famotidine , fluticasone , gabapentin , hydrALAZINE , hydrOXYzine , hydroxychloroquine, loratadine , losartan , meclizine , metoprolol  succinate, midodrine , montelukast , multivitamin with minerals, mycophenolate , oxybutynin , pantoprazole , rosuvastatin , sertraline , solifenacin , tadalafil , and tamsulosin   History Review  Allergy: Jonathon Snow is allergic to enalapril and vicodin [hydrocodone -acetaminophen ]. Drug: Jonathon Snow  reports no history of  drug use. Alcohol:  reports no history of alcohol use. Tobacco:  reports that he has never smoked. He has never used smokeless tobacco. Social: Jonathon Snow  reports that he has never smoked. He has never used smokeless tobacco. He reports that he does not drink alcohol and does not use drugs. 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), Closed left hip fracture, initial encounter (HCC) (02/18/2023), COVID-19 virus infection (12/2020), Depression, DVT (deep venous thrombosis) (HCC), Dyspnea, GERD (gastroesophageal reflux disease), Head injury (11/05/2023), Heart attack (HCC) (11/05/2023), Heel spur, left (02/16/2019), Heel spur, right (02/16/2019), Hematoma of groin (11/08/2023), Herpes zoster infection of lumbar region (02/20/2020), History of DVT (deep vein thrombosis) (03/22/2020), History of kidney stones, HTN (hypertension), Hyperlipidemia, Hypertension, Long term current use of systemic steroids (11/08/2020), Lupus, Lymphedema (10/07/2018), Morbid obesity (HCC), Opiate abuse, episodic (HCC) (02/26/2018), Osteoporosis, Pain and swelling of right lower extremity (02/03/2023), Pneumonia, PONV (postoperative nausea and vomiting), Postphlebitic syndrome with ulcer, 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 Surgical: Jonathon Snow  has a past surgical history that includes Cyst excision (92 or 93 ); Ankle surgery (Right); I & D extremity (Right, 04/29/2017); Irrigation and debridement abscess (Left, 04/29/2017); Colonoscopy  with propofol  (N/A, 05/28/2020); Flexible bronchoscopy (N/A, 11/01/2021); Bronchial washings (N/A, 11/01/2021); Back surgery; Hip pinning, cannulated (Left, 02/19/2023); Colonoscopy with propofol  (N/A, 07/01/2023); polypectomy (07/01/2023); Coronary/Graft Acute MI Revascularization (N/A, 11/05/2023); LEFT HEART CATH AND CORONARY ANGIOGRAPHY (N/A, 11/05/2023); and Coronary Ultrasound/IVUS (N/A, 11/05/2023). Family: family history includes Clotting disorder in his mother; Heart attack in his maternal grandfather, maternal grandmother, and paternal grandfather; Heart disease in his father; Hypertension in his father; Kidney disease in his brother.  Laboratory Chemistry Profile   Renal Lab Results  Component Value Date   BUN 24 07/28/2024   CREATININE 1.59 (H) 07/28/2024   LABCREA 153 11/08/2023   BCR 15 07/28/2024   GFRAA 53 (L) 11/07/2020   GFRNONAA 50 (L) 02/27/2024    Hepatic Lab Results  Component Value Date   AST 11 07/28/2024   ALT 9 07/28/2024   ALBUMIN  3.7 02/16/2024   ALKPHOS 91 02/16/2024   LIPASE 34 11/05/2023    Electrolytes Lab Results  Component Value Date   NA 137 07/28/2024   K 4.1 07/28/2024   CL 106 07/28/2024   CALCIUM  9.2 07/28/2024   MG 2.0 11/06/2023   PHOS 3.8 11/06/2023    Bone Lab Results  Component Value Date   VD25OH 35 11/15/2018    Inflammation (  CRP: Acute Phase) (ESR: Chronic Phase) Lab Results  Component Value Date   CRP 0.8 09/10/2021   ESRSEDRATE 25 (H) 09/10/2021   LATICACIDVEN 1.3 08/22/2021         Note: Above Lab results reviewed.  Recent Imaging Review  MR BRAIN/IAC W WO CONTRAST CLINICAL DATA:  Asymmetric hearing loss left worse than right. Tinnitus. Left neck pain.  EXAM: MRI HEAD WITHOUT AND WITH CONTRAST  TECHNIQUE: Multiplanar, multiecho pulse sequences of the brain and surrounding structures were obtained without and with intravenous contrast.  CONTRAST:  10 cc Vueway   COMPARISON:  02/26/2024  FINDINGS: Brain:  Diffusion imaging does not show any acute or subacute infarction or other cause of restricted diffusion. No focal abnormality affects brainstem. Chronic mega cisterna magna incidentally noted, more prominent towards the right. CP angle regions are normal. No vestibular schwannoma or enhancing neuritis. Cerebral hemispheres do not show accelerated volume loss. No evidence of previous large vessel stroke or widespread small-vessel disease. Punctate focus of hemosiderin deposition in the left frontal white matter is unchanged and not likely significant. No hydrocephalus or extra-axial collection. No abnormal brain or leptomeningeal enhancement otherwise.  Vascular: Major vessels at the base of the brain show flow.  Skull and upper cervical spine: Negative  Sinuses/Orbits: Clear/normal  Other: None significant.  IMPRESSION: 1. No cause of the presenting symptoms is identified. No vestibular schwannoma or enhancing neuritis. 2. Chronic mega cisterna magna, more prominent towards the right. 3. Punctate focus of hemosiderin deposition in the left frontal white matter, unchanged and not likely significant.  Electronically Signed   By: Oneil Officer M.D.   On: 08/29/2024 13:11 Note: Reviewed        Physical Exam  Vitals: BP 137/77 (BP Location: Right Arm)   Pulse 63   Temp 97.6 F (36.4 C) (Temporal)   Resp 20   Ht 6' 2 (1.88 m)   Wt 220 lb (99.8 kg)   SpO2 100%   BMI 28.25 kg/m  BMI: Estimated body mass index is 28.25 kg/m as calculated from the following:   Height as of this encounter: 6' 2 (1.88 m).   Weight as of this encounter: 220 lb (99.8 kg). Ideal: Ideal body weight: 82.2 kg (181 lb 3.5 oz) Adjusted ideal body weight: 89.2 kg (196 lb 11.7 oz) General appearance: Well nourished, well developed, and well hydrated. In no apparent acute distress Mental status: Alert, oriented x 3 (person, place, & time)       Respiratory: No evidence of acute respiratory  distress Eyes: PERLA   Assessment   Diagnosis Status  1. Cervical facet joint syndrome   2. Chronic pain syndrome   3. Cervical radicular pain   4. Cervical spondylosis without myelopathy   5. History of lumbar spinal fusion (L5-S1)   6. Medication management    Controlled Controlled Controlled   Updated Problems: No problems updated.  Plan of Care  Problem-specific:  Assessment and Plan  We will continue on current medication regimen.  Prescribing drug monitoring (PDMP) reviewed; findings consistent with the use of prescribed medication and no evidence of narcotic misuse or abuse.  Urine drug screening (UDS) up-to-date.  No other new issues or problems reported to this visit.  Schedule follow-up 6 week for medication management with Emmy Blanch NP  Robaxin  IM injection given   Jonathon Snow has a current medication list which includes the following long-term medication(s): apixaban , carbidopa-levodopa, clonazepam , famotidine , fluticasone , gabapentin , hydralazine , losartan , metoprolol  succinate, montelukast , pantoprazole , rosuvastatin , sertraline ,  and tadalafil .  Pharmacotherapy (Medications Ordered): Meds ordered this encounter  Medications   HYDROcodone -acetaminophen  (NORCO/VICODIN) 5-325 MG tablet    Sig: Take 1 tablet by mouth every 8 (eight) hours as needed for severe pain (pain score 7-10). Must last 30 days    Dispense:  90 tablet    Refill:  0    Chronic Pain: STOP Act (Not applicable) Fill 1 day early if closed on refill date. Avoid benzodiazepines within 8 hours of opioids   JOURNAVX  50 MG TABS    Sig: Take 50 mg by mouth every 12 (twelve) hours.    Dispense:  60 tablet    Refill:  0   methocarbamol  (ROBAXIN ) injection 200 mg   Orders:  No orders of the defined types were placed in this encounter.       Return in about 6 weeks (around 10/17/2024) for (F2F), (MM), Emmy Blanch NP.    Recent Visits Date Type Provider Dept  08/17/24 Office Visit Shaya Reddick,  Almus Woodham K, NP [facility]  08/09/24 Office Visit Edgar Corrigan K, NP [facility]  07/19/24 Office Visit Marcelino Nurse, MD [facility]  07/07/24 Office Visit Marcelino Nurse, MD [facility]  06/15/24 Procedure visit Marcelino Nurse, MD [facility]  Showing recent visits within past 90 days and meeting all other requirements Today's Visits Date Type Provider Dept  09/05/24 Office Visit Lizabeth Fellner K, NP [facility]  Showing today's visits and meeting all other requirements Future Appointments No visits were found meeting these conditions. Showing future appointments within next 90 days and meeting all other requirements  I discussed the assessment and treatment plan with the patient. The patient was provided an opportunity to ask questions and all were answered. The patient agreed with the plan and demonstrated an understanding of the instructions.  Patient advised to call back or seek an in-person evaluation if the symptoms or condition worsens.  Duration of encounter: 30 minutes.  Total time on encounter, as per AMA guidelines included both the face-to-face and non-face-to-face time personally spent by the physician and/or other qualified health care professional(s) on the day of the encounter (includes time in activities that require the physician or other qualified health care professional and does not include time in activities normally performed by clinical staff). Physician's time may include the following activities when performed: Preparing to see the patient (e.g., pre-charting review of records, searching for previously ordered imaging, lab work, and nerve conduction tests) Review of prior analgesic pharmacotherapies. Reviewing PMP Interpreting ordered tests (e.g., lab work, imaging, nerve conduction tests) Performing post-procedure evaluations, including interpretation of diagnostic procedures Obtaining and/or reviewing  separately obtained history Performing a medically appropriate examination and/or evaluation Counseling and educating the patient/family/caregiver Ordering medications, tests, or procedures Referring and communicating with other health care professionals (when not separately reported) Documenting clinical information in the electronic or other health record Independently interpreting results (not separately reported) and communicating results to the patient/ family/caregiver Care coordination (not separately reported)  Note by: Noah Lembke K Mercy Leppla, NP (TTS and AI technology used. I apologize for any typographical errors that were not detected and corrected.) Date: 09/05/2024; Time: 8:46 AM

## 2024-09-06 ENCOUNTER — Encounter: Admitting: Family Medicine

## 2024-09-07 ENCOUNTER — Ambulatory Visit: Attending: Neurology

## 2024-09-07 VITALS — BP 159/88 | HR 72

## 2024-09-07 DIAGNOSIS — M5412 Radiculopathy, cervical region: Secondary | ICD-10-CM | POA: Insufficient documentation

## 2024-09-07 DIAGNOSIS — M542 Cervicalgia: Secondary | ICD-10-CM | POA: Diagnosis present

## 2024-09-07 DIAGNOSIS — R2681 Unsteadiness on feet: Secondary | ICD-10-CM | POA: Diagnosis present

## 2024-09-07 DIAGNOSIS — I951 Orthostatic hypotension: Secondary | ICD-10-CM | POA: Insufficient documentation

## 2024-09-07 NOTE — Therapy (Signed)
 OUTPATIENT PHYSICAL THERAPY EVALUATION Patient Name: Jonathon Snow MRN: [id number] DOB:[birth year], 63 y.o., male Today's Date: 09/07/2024  PCP: [name], PA-C REFERRING PROVIDER: Maree Jannett POUR, MD  END OF SESSION:  PT End of Session - 09/07/24 0805     Visit Number 1    Number of Visits 16    Date for PT Re-Evaluation 11/02/24    Authorization Time Period 09/07/24-11/02/24    Progress Note Due on Visit 10    PT Start Time 0800    PT Stop Time 0840    PT Time Calculation (min) 40 min    Equipment Utilized During Treatment Gait belt    Activity Tolerance Patient tolerated treatment well;No increased pain    Behavior During Therapy [facility] for tasks assessed/performed          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   Overview:  superior sagittal sinus, left transverse sinus and cortical veins    Closed left hip fracture, initial encounter (HCC) 02/18/2023   COVID-19 virus infection 12/2020   Depression    DVT (deep venous thrombosis) (HCC)    Dyspnea    GERD (gastroesophageal reflux disease)    Head injury 11/05/2023   Heart attack (HCC) 11/05/2023   Heel spur, left 02/16/2019   Heel spur, right 02/16/2019   Hematoma of groin 11/08/2023   Herpes zoster infection of lumbar region 02/20/2020   History of DVT (deep vein thrombosis) 03/22/2020   History of kidney stones    HTN (hypertension)    Hyperlipidemia    Hypertension    Long term current use of systemic steroids 11/08/2020   Lupus    Lymphedema 10/07/2018   Morbid obesity (HCC)    Opiate abuse, episodic (HCC) 02/26/2018   Osteoporosis    Pain and swelling of right lower extremity 02/03/2023   Pneumonia    PONV (postoperative nausea and vomiting)    Postphlebitic syndrome with  ulcer, 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   Presence of IVC filter 03/22/2020   Removed   Pulmonary embolism (HCC)    Renal disorder    Stage III   Severe episode of recurrent major depressive disorder, 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   Shock circulatory (HCC) 11/06/2023   STEMI (ST elevation myocardial infarction) (HCC) 11/05/2023   Past Surgical History:  Procedure Laterality Date   ANKLE SURGERY Right    BACK SURGERY     BRONCHIAL WASHINGS N/A 11/01/2021   Procedure: BRONCHIAL WASHINGS;  Surgeon: Parris Manna, MD;  Location: [facility] ORS;  Service: Thoracic;  Laterality: N/A;   COLONOSCOPY WITH PROPOFOL  N/A 05/28/2020   Procedure: COLONOSCOPY WITH PROPOFOL ;  Surgeon: Therisa Bi, MD;  Location: [facility] ENDOSCOPY;  Service: Endoscopy;  Laterality: N/A;   COLONOSCOPY WITH PROPOFOL  N/A 07/01/2023   Procedure: COLONOSCOPY WITH PROPOFOL ;  Surgeon: Therisa Bi, MD;  Location: [facility] ENDOSCOPY;  Service: Gastroenterology;  Laterality: N/A;   CORONARY ULTRASOUND/IVUS N/A 11/05/2023   Procedure: Coronary Ultrasound/IVUS;  Surgeon: Mady Bruckner, MD;  Location: [facility] INVASIVE CV LAB;  Service: Cardiovascular;  Laterality: N/A;   CORONARY/GRAFT ACUTE MI REVASCULARIZATION N/A 11/05/2023   Procedure: Coronary/Graft Acute MI Revascularization;  Surgeon: Mady Bruckner, MD;  Location: [facility] INVASIVE CV LAB;  Service: Cardiovascular;  Laterality: N/A;   CYST EXCISION  92 or 93    Liver  cyst removal [facility]   FLEXIBLE BRONCHOSCOPY N/A 11/01/2021   Procedure: FLEXIBLE BRONCHOSCOPY;  Surgeon: Parris Manna, MD;  Location: [facility] ORS;  Service: Thoracic;  Laterality: N/A;   HIP PINNING,CANNULATED Left 02/19/2023   Procedure: PERCUTANEOUS FIXATION OF FEMORAL NECK;  Surgeon: Tobie Priest, MD;  Location: [facility] ORS;  Service: Orthopedics;  Laterality: Left;   I & D EXTREMITY Right 04/29/2017   Procedure: IRRIGATION AND DEBRIDEMENT EXTREMITY;  Surgeon: Shelva Dunnings, MD;  Location: [facility] ORS;  Service: General;   Laterality: Right;   IRRIGATION AND DEBRIDEMENT ABSCESS Left 04/29/2017   Procedure: IRRIGATION AND DEBRIDEMENT Scrotal ABSCESS;  Surgeon: Shelva Dunnings, MD;  Location: [facility] ORS;  Service: General;  Laterality: Left;   LEFT HEART CATH AND CORONARY ANGIOGRAPHY N/A 11/05/2023   Procedure: LEFT HEART CATH AND CORONARY ANGIOGRAPHY;  Surgeon: Mady Bruckner, MD;  Location: [facility] INVASIVE CV LAB;  Service: Cardiovascular;  Laterality: N/A;   POLYPECTOMY  07/01/2023   Procedure: POLYPECTOMY;  Surgeon: Therisa Bi, MD;  Location: [facility] ENDOSCOPY;  Service: Gastroenterology;;   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) 01/20/2024   Anxiety 12/14/2023   Overweight (BMI 25.0-29.9) 11/23/2023   Chronic heart failure with preserved ejection fraction (HFpEF) (HCC) 11/23/2023   BPH (benign prostatic hyperplasia) 11/23/2023   Unstable angina (HCC) 11/10/2023   Closed T10 spinal fracture (HCC) 11/06/2023   Closed tricolumnar fracture of thoracic vertebra (HCC) 11/06/2023   Thoracic spine instability 11/06/2023   Chronic bilateral low back pain with left-sided sciatica 07/07/2023   Neuropathy 07/07/2023   Hx of colonic polyps 07/01/2023   Adenomatous polyp of colon 07/01/2023   Erectile disorder 01/22/2023   Coronary artery disease involving native coronary artery of native heart without angina pectoris 09/09/2022   Varicose veins of left lower  extremity with inflammation 01/03/2022   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) 09/18/2021   Prediabetes 11/08/2020   Stage 3a chronic kidney disease (HCC) 11/08/2020   Seasonal allergies 11/08/2020   Rhinosinusitis 11/08/2020   Anticoagulant disorder (HCC) 11/07/2020   Senile purpura (HCC) 11/07/2020   MDD (major depressive disorder), recurrent episode, mild (HCC) 09/12/2019   Other spondylosis with radiculopathy, lumbar region 02/16/2019   Osteoporosis 10/12/2018   Chronic venous insufficiency 10/07/2018   Lymphedema 10/07/2018   SLE glomerulonephritis syndrome, 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HCC) 03/03/2018   Syncope and collapse 12/29/2017   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) 10/29/2016   Hyperlipidemia LDL goal <70 01/15/2016   Uncontrolled hypertension 01/15/2016   Obesity (BMI 30.0-34.9) 01/15/2016   Long term current use of anticoagulant 10/04/2015   Systemic lupus erythematosus (HCC) 05/30/2015   COPD, moderate (HCC) 06/27/2014   Shortness of breath 06/27/2014   History of pulmonary embolism 12/11/2013   Nodule of right lung 12/11/2013   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   Cystic disease of liver 08/21/2013   ONSET DATE: November 2024   REFERRING DIAG: imbalance  THERAPY DIAG:  Syncope due to orthostatic hypotension  Unsteadiness on feet  Rationale for Evaluation and Treatment: Rehabilitation  SUBJECTIVE:                                                                                                                                                                                            SUBJECTIVE STATEMENT: Pt needs help with balance issues. He has chronic orthostatic hypotension.   PERTINENT HISTORY:   62yoM referred to OPPT for imbalance. Pt just finished OPPT for post surgical neck pain on 08/30/24 at [facility] so he could begin his balance therapy here. Pt has had issues with chronic  orthostatic hypotension repeated syncope, fluctuating intolerance to upright. Pt has become quite good at managing his safety at home with symptoms fluctuations. PT checks BP daily, has PRN meds for excessively low or high pressures.   PAIN:  Are you having pain? Yes (chronic pain still 5/10 neck, mid back, low back)   PRECAUTIONS: Fall  WEIGHT BEARING RESTRICTIONS: No  FALLS: Has patient fallen in last 6 months? Yes 1x in March 2025  LIVING ENVIRONMENT: Lives with: Alone  Lives in: 1 level  Stairs: threshold only Has following equipment at home: RW, canes, walking sticks.   PLOF: 2 years ago was able to go hiking for pleasure ab lib; used to get in and out kayak and do multi day treks overnight with kayak.   PATIENT GOALS: improve tolerance to upright mobility, stop falling as much   OBJECTIVE:  Note: Objective measures were completed at evaluation unless otherwise noted.  DIAGNOSTIC FINDINGS:   COGNITION: Overall cognitive status: Within functional limits for tasks assessed   FUNCTIONAL TESTS:  TUG: 34sec (armless chair)  5xSTS: 54.9sec with BUE heavy use arm chair  : 14.63 Normal stance eyes closed: Narrow stance eyes closed:  PATIENT SURVEYS:  ABC Scale: 47.5%                                                                                                                              TREATMENT DATE 09/07/24:  -STS from chair x5:  very labored, weakness in legs, absolute need for arms, limited by pain in back;   -5x STS hands free from high surface (dialing in moderate resistance for home performance) 27 -8x STS hands free from 29 inches -overground AMB 55ft x2 (has mildly reduced toe clearance on this side, pt confirms more sciatica pain on Left side)  -STS from armless chair, forard AMB 56ft sharp 180 degree turn, forard AMB 5ft, return to sitting: 34sec   PATIENT EDUCATION: Education details: Management recommendations for orthostatic hypotension  Person  educated: Programmer, applications: Discussion, handout Education comprehension: Fair   HOME EXERCISE PROGRAM: Access Code: [email or web address] URL: [email or web address] Date: 09/07/2024 Prepared by: Peggye Linear  Exercises - sit to stand, arms crossed, 28-30 inch surface   - 3 x daily - 1 sets - 8 reps - Standing Near Stance in Corner with Eyes Closed  - 3 x daily - 1 sets - 5 reps - 30 hold  GOALS: Goals reviewed with patient? No  SHORT TERM GOALS: Target date: 10/07/24  Pt to report no falls in most recent 4 weeks to indicate good safety practices and symptoms management in the home.  Baseline: Goal status: INITIAL  2.  Pt will demonstrate improvement in 5xSTS performance by >3 seconds.  Baseline:  Goal status: INITIAL  3.  Pt to demonstrate improvement in by 20% and backwards by 25%  Baseline:  Goal status: INITIAL   LONG TERM GOALS: Target date: 11/07/24  Pt to demonstrate tolerance of >1023ft sustained AMB c LRAD without presyncope, without LOB, and without increase in back pain >2/10 points NPRS.  Baseline:  Goal status: INITIAL  2.  5xSTS performance, standard chair, hands free <20sec; hands free from 19.5 surface height in <12sec.   Baseline:  Goal status: INITIAL  3.  Eye closed in narrow stance x60sec (5x without LOB and without need for hand support)  Baseline:  Goal status: INITIAL  ASSESSMENT:  CLINICAL IMPRESSION: 62yoM evaluated for ongoing unsteadiness, orthotatic dizziness, recent repeat syncope and falls. Objective tests and measures show far below age-matched-norm performance on transfers, static balance, walking, and dynamic balance. Pt is unable to perform his typical community level mobility and fitness/ leisure mobility tasks. Patient will benefit from skilled physical therapy intervention to reduce deficits and impairments identified in evaluation, in order to reduce pain, improve quality of life, and maximize activity  tolerance for ADL, IADL, and leisure/fitness. Physical therapy will help pt achieve long and short term goals of care.    OBJECTIVE IMPAIRMENTS: Decreased knowledge of condition, decreased use of DME, decreased mobility, difficulty walking, decreased strength, decreased ROM. ACTIVITY LIMITATIONS: Lifting, standing, walking, squatting, transfers, locomotion level PARTICIPATION LIMITATIONS: Cleaning, laundry, interpersonal relationships, driving, yardwork, community activity.  PERSONAL FACTORS: Age, behavior pattern, education, past/current experiences, transportation, profession  are also affecting patient's functional outcome.  REHAB POTENTIAL: Good CLINICAL DECISION MAKING: Medium  EVALUATION COMPLEXITY: Moderate    PLAN:  PT FREQUENCY: 1-2x/week  PT DURATION: 8 weeks  PLANNED INTERVENTIONS: 97110-Therapeutic exercises, 97530- Therapeutic activity, [id number]- Neuromuscular re-education, 97535- Self Care, [id number]- Manual therapy, [id number]- Gait training, [phone]- Electrical stimulation (unattended), [phone]- Electrical stimulation (manual), Patient/Family education, Balance training, Stair training, Cryotherapy, and Moist heat  PLAN FOR NEXT SESSION:  -review HEP, assess sustained walking tolernace.   Bertrand Vowels C, PT 09/07/2024, 8:08 AM  9:23 AM, 09/07/24 Peggye JAYSON Linear, PT, DPT Physical Therapist - [place] [facility]  Outpatient Physical Therapy- Main Campus [phone]

## 2024-09-08 ENCOUNTER — Ambulatory Visit (INDEPENDENT_AMBULATORY_CARE_PROVIDER_SITE_OTHER): Admitting: Nurse Practitioner

## 2024-09-08 ENCOUNTER — Encounter (INDEPENDENT_AMBULATORY_CARE_PROVIDER_SITE_OTHER): Payer: Self-pay | Admitting: Nurse Practitioner

## 2024-09-08 VITALS — BP 162/92 | HR 63 | Resp 18 | Wt 217.2 lb

## 2024-09-08 DIAGNOSIS — I890 Lymphedema, not elsewhere classified: Secondary | ICD-10-CM | POA: Diagnosis not present

## 2024-09-08 NOTE — Progress Notes (Signed)
 History of Present Illness  There is no documented history at this time  Assessments & Plan   There are no diagnoses linked to this encounter.    Additional instructions  Subjective:  Patient presents with venous ulcer of the Left lower extremity.    Procedure:  3 layer unna wrap was placed Left lower extremity.   Plan:   Follow up in one week.

## 2024-09-09 ENCOUNTER — Other Ambulatory Visit: Payer: Self-pay

## 2024-09-09 DIAGNOSIS — N529 Male erectile dysfunction, unspecified: Secondary | ICD-10-CM

## 2024-09-09 MED ORDER — TADALAFIL 20 MG PO TABS: 20.0000 mg | ORAL_TABLET | Freq: Every day | ORAL | 7 refills | Status: AC | PRN

## 2024-09-11 ENCOUNTER — Encounter (INDEPENDENT_AMBULATORY_CARE_PROVIDER_SITE_OTHER): Payer: Self-pay | Admitting: Nurse Practitioner

## 2024-09-12 ENCOUNTER — Ambulatory Visit: Admitting: Physical Therapy

## 2024-09-12 DIAGNOSIS — I951 Orthostatic hypotension: Secondary | ICD-10-CM | POA: Diagnosis not present

## 2024-09-12 DIAGNOSIS — R2681 Unsteadiness on feet: Secondary | ICD-10-CM

## 2024-09-12 NOTE — Therapy (Signed)
 OUTPATIENT PHYSICAL THERAPY EVALUATION Patient Name: Jonathon Snow MRN: [id number] DOB:06-Dec-1961, 63 y.o., male Today's Date: 09/12/2024  PCP: [name], PA-C REFERRING PROVIDER: Maree Jannett POUR, MD  END OF SESSION:  PT End of Session - 09/12/24 0802     Visit Number 2    Number of Visits 16    Date for Recertification  11/02/24    Authorization Time Period 09/07/24-11/02/24    Progress Note Due on Visit 10    PT Start Time 0804    PT Stop Time 0849    PT Time Calculation (min) 45 min    Equipment Utilized During Treatment Gait belt    Activity Tolerance Patient tolerated treatment well;No increased pain    Behavior During Therapy [facility] for tasks assessed/performed          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   Overview:  superior sagittal sinus, left transverse sinus and cortical veins    Closed left hip fracture, initial encounter (HCC) 02/18/2023   COVID-19 virus infection 12/2020   Depression    DVT (deep venous thrombosis) (HCC)    Dyspnea    GERD (gastroesophageal reflux disease)    Head injury 11/05/2023   Heart attack (HCC) 11/05/2023   Heel spur, left 02/16/2019   Heel spur, right 02/16/2019   Hematoma of groin 11/08/2023   Herpes zoster infection of lumbar region 02/20/2020   History of DVT (deep vein thrombosis) 03/22/2020   History of kidney stones    HTN (hypertension)    Hyperlipidemia    Hypertension    Long term current use of systemic steroids 11/08/2020   Lupus    Lymphedema 10/07/2018   Morbid obesity (HCC)    Opiate abuse, episodic (HCC) 02/26/2018   Osteoporosis    Pain and swelling of right lower extremity 02/03/2023   Pneumonia    PONV (postoperative nausea and vomiting)    Postphlebitic syndrome with  ulcer, 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   Presence of IVC filter 03/22/2020   Removed   Pulmonary embolism (HCC)    Renal disorder    Stage III   Severe episode of recurrent major depressive disorder, 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   Shock circulatory (HCC) 11/06/2023   STEMI (ST elevation myocardial infarction) (HCC) 11/05/2023   Past Surgical History:  Procedure Laterality Date   ANKLE SURGERY Right    BACK SURGERY     BRONCHIAL WASHINGS N/A 11/01/2021   Procedure: BRONCHIAL WASHINGS;  Surgeon: Parris Manna, MD;  Location: [facility] ORS;  Service: Thoracic;  Laterality: N/A;   COLONOSCOPY WITH PROPOFOL  N/A 05/28/2020   Procedure: COLONOSCOPY WITH PROPOFOL ;  Surgeon: Therisa Bi, MD;  Location: [facility] ENDOSCOPY;  Service: Endoscopy;  Laterality: N/A;   COLONOSCOPY WITH PROPOFOL  N/A 07/01/2023   Procedure: COLONOSCOPY WITH PROPOFOL ;  Surgeon: Therisa Bi, MD;  Location: [facility] ENDOSCOPY;  Service: Gastroenterology;  Laterality: N/A;   CORONARY ULTRASOUND/IVUS N/A 11/05/2023   Procedure: Coronary Ultrasound/IVUS;  Surgeon: Mady Bruckner, MD;  Location: [facility] INVASIVE CV LAB;  Service: Cardiovascular;  Laterality: N/A;   CORONARY/GRAFT ACUTE MI REVASCULARIZATION N/A 11/05/2023   Procedure: Coronary/Graft Acute MI Revascularization;  Surgeon: Mady Bruckner, MD;  Location: [facility] INVASIVE CV LAB;  Service: Cardiovascular;  Laterality: N/A;   CYST EXCISION  92 or 93    Liver  cyst removal [facility]   FLEXIBLE BRONCHOSCOPY N/A 11/01/2021   Procedure: FLEXIBLE BRONCHOSCOPY;  Surgeon: Parris Manna, MD;  Location: [facility] ORS;  Service: Thoracic;  Laterality: N/A;   HIP PINNING,CANNULATED Left 02/19/2023   Procedure: PERCUTANEOUS FIXATION OF FEMORAL NECK;  Surgeon: Tobie Priest, MD;  Location: [facility] ORS;  Service: Orthopedics;  Laterality: Left;   I & D EXTREMITY Right 04/29/2017   Procedure: IRRIGATION AND DEBRIDEMENT EXTREMITY;  Surgeon: Shelva Dunnings, MD;  Location: [facility] ORS;  Service: General;   Laterality: Right;   IRRIGATION AND DEBRIDEMENT ABSCESS Left 04/29/2017   Procedure: IRRIGATION AND DEBRIDEMENT Scrotal ABSCESS;  Surgeon: Shelva Dunnings, MD;  Location: [facility] ORS;  Service: General;  Laterality: Left;   LEFT HEART CATH AND CORONARY ANGIOGRAPHY N/A 11/05/2023   Procedure: LEFT HEART CATH AND CORONARY ANGIOGRAPHY;  Surgeon: Mady Bruckner, MD;  Location: [facility] INVASIVE CV LAB;  Service: Cardiovascular;  Laterality: N/A;   POLYPECTOMY  07/01/2023   Procedure: POLYPECTOMY;  Surgeon: Therisa Bi, MD;  Location: [facility]COPY;  Service: Gastroenterology;;   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) 01/20/2024   Anxiety 12/14/2023   Overweight (BMI 25.0-29.9) 11/23/2023   Chronic heart failure with preserved ejection fraction (HFpEF) (HCC) 11/23/2023   BPH (benign prostatic hyperplasia) 11/23/2023   Unstable angina (HCC) 11/10/2023   Closed T10 spinal fracture (HCC) 11/06/2023   Closed tricolumnar fracture of thoracic vertebra (HCC) 11/06/2023   Thoracic spine instability 11/06/2023   Chronic bilateral low back pain with left-sided sciatica 07/07/2023   Neuropathy 07/07/2023   Hx of colonic polyps 07/01/2023   Adenomatous polyp of colon 07/01/2023   Erectile disorder 01/22/2023   Coronary artery disease involving native coronary artery of native heart without angina pectoris 09/09/2022   Varicose veins of left lower  extremity with inflammation 01/03/2022   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) 09/18/2021   Prediabetes 11/08/2020   Stage 3a chronic kidney disease (HCC) 11/08/2020   Seasonal allergies 11/08/2020   Rhinosinusitis 11/08/2020   Anticoagulant disorder (HCC) 11/07/2020   Senile purpura (HCC) 11/07/2020   MDD (major depressive disorder), recurrent episode, mild (HCC) 09/12/2019   Other spondylosis with radiculopathy, lumbar region 02/16/2019   Osteoporosis 10/12/2018   Chronic venous insufficiency 10/07/2018   Lymphedema 10/07/2018   SLE glomerulonephritis syndrome, 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HCC) 03/03/2018   Syncope and collapse 12/29/2017   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) 10/29/2016   Hyperlipidemia LDL goal <70 01/15/2016   Uncontrolled hypertension 01/15/2016   Obesity (BMI 30.0-34.9) 01/15/2016   Long term current use of anticoagulant 10/04/2015   Systemic lupus erythematosus (HCC) 05/30/2015   COPD, moderate (HCC) 06/27/2014   Shortness of breath 06/27/2014   History of pulmonary embolism 12/11/2013   Nodule of right lung 12/11/2013   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   Cystic disease of liver 08/21/2013   ONSET DATE: November 2024   REFERRING DIAG: imbalance  THERAPY DIAG:  Syncope due to orthostatic hypotension  Unsteadiness on feet  Rationale for Evaluation and Treatment: Rehabilitation  SUBJECTIVE:                                                                                                                                                                                            SUBJECTIVE STATEMENT: Pt needs help with balance issues. He has chronic orthostatic hypotension.  On this day he reports overall pain level 8/10 with sciatic pain in the LLE and from neck in the Bil shoulders but L >R.    PERTINENT HISTORY:   62yoM referred to OPPT for imbalance. Pt just finished OPPT for post  surgical neck pain on 08/30/24 at [facility] so he could begin his balance therapy here. Pt has had issues with chronic orthostatic hypotension repeated syncope, fluctuating intolerance to upright. Pt has become quite good at managing his safety at home with symptoms fluctuations. PT checks BP daily, has PRN meds for excessively low or high pressures.   PAIN:  Are you having pain? Yes (chronic pain still 5/10 neck, mid back, low back)   PRECAUTIONS: Fall  WEIGHT BEARING RESTRICTIONS: No  FALLS: Has patient fallen in last 6 months? Yes 1x in March 2025  LIVING ENVIRONMENT: Lives with: Alone  Lives in: 1 level  Stairs: threshold only Has following equipment at home: RW, canes, walking sticks.   PLOF: 2 years ago was able to go hiking for pleasure ab lib; used to get in and out kayak and do multi day treks overnight with kayak.   PATIENT GOALS: improve tolerance to upright mobility, stop falling as much   OBJECTIVE:  Note: Objective measures were completed at evaluation unless otherwise noted.  DIAGNOSTIC FINDINGS:   COGNITION: Overall cognitive status: Within functional limits for tasks assessed   FUNCTIONAL TESTS:  TUG: 34sec (armless chair)  5xSTS: 54.9sec with BUE heavy use arm chair  : 14.63 Normal stance eyes closed: 9/22: 5-10sec for 3 bouts  Narrow stance eyes closed: 9/22: 17-18 sec for 3 bouts   PATIENT SURVEYS:  ABC Scale: 47.5%  TREATMENT DATE 09/12/24:   Gait training:  Ambulatory into clinic without device. Additional gait training x 374ft without AD with min cues for foot clearance on the LLE with fatigue.  Additional 366ft following balance training total 2:33min with noted increase in LLE antalgia. Reports Stinging pain in the L foot with  weak feeling like it could Give in  NMR:  Standing at rail:  Eyes closed normal BOS: 17-18  sec each bout Eyes closed narrow stance: 15 sec x 3 but 5-10 sec without UE support on each bout  Eyes open semitandem 15 sec x 2 bil with intermittent light UE support after about 10 sec to prevent lateral LOB.   Forward/reverse gait 71ft x 3. Was noted to have improved step length on each bout. CGA for safety throughout   Pt perform in reverse for goal assessment: 49.04 sec   Throughout session pt performed sit<>stand from elevated surface with airex mat in seat x 12 required heavy use of arm rests to push into standing.   Seated Therex:  Ankle PF RTB x 12  Hip abduction RTB x 10  AROM LAQ x 10  Seated march AROM x 10    PATIENT EDUCATION: Education details: Management recommendations for orthostatic hypotension  Person educated: Programmer, applications: Discussion, handout Education comprehension: Fair   HOME EXERCISE PROGRAM: Access Code: [email or web address] URL: [email or web address] Date: 09/12/2024 Prepared by: Massie Dollar  Exercises - sit to stand, arms crossed, 28-30 inch surface   - 3 x daily - 1 sets - 8 reps - Standing Near Stance in Corner with Eyes Closed  - 3 x daily - 1 sets - 5 reps - 30 hold - Semi-Tandem Balance at Counter Top Eyes Open  - 1 x daily - 7 x weekly - 3 sets - 4 reps - 15 hold - Standing March with Counter Support  - 1 x daily - 7 x weekly - 3 sets - 10 reps - Seated Long Arc Quad  - 1 x daily - 7 x weekly - 3 sets - 10 reps - Backward Walking with Counter Support  - 1 x daily - 7 x weekly - 3 sets - 5 reps  GOALS: Goals reviewed with patient? No  SHORT TERM GOALS: Target date: 10/07/24  Pt to report no falls in most recent 4 weeks to indicate good safety practices and symptoms management in the home.  Baseline: reports orthostatic s/s roughly 2x per week.  Goal status: INITIAL  2.  Pt will demonstrate improvement in 5xSTS performance by >3 seconds.  Baseline: 54.9sec with BUE heavy use arm chair  Goal status: INITIAL  3.   Pt to demonstrate improvement in by 20% and backwards by 25%  Baseline: 9/22: forward: 14.63 sec (0.24m/s). Backward: 49.04 sec 0.36m/s  Goal status: INITIAL   LONG TERM GOALS: Target date: 11/07/24  Pt to demonstrate tolerance of >1090ft sustained AMB c LRAD without presyncope, without LOB, and without increase in back pain >2/10 points NPRS.  Baseline: tolerated 328ft with mild-moderate increase in L hip and low back pain.  Goal status: INITIAL  2.  5xSTS performance, standard chair, hands free <20sec; hands free from 19.5 surface height in <12sec.   Baseline:  54.9sec with BUE heavy use arm chair  Goal status: INITIAL  3.  Eye closed in narrow stance x60sec (5x without LOB and without need for hand support)  Baseline: 4-10 sec  Goal status: INITIAL  ASSESSMENT:  CLINICAL IMPRESSION: 62yoM seen  for PT treatment for ongoing unsteadiness, orthotatic dizziness, recent repeat syncope and falls. Continued to assess function based on goals set at eval to measure reverse gait and tolerance for static standing eyes closed with normal and narrow. Base, as well gait tolerance. Limited in all as listed above. HEP advanced to incorporate narrow stance for semitandem as well as targeted strengthening interventions for BLE. Pt tolerated all interventions well, but required multiple rest brakes throughout session due to pain and weakness in the LLE.  Patient will benefit from skilled physical therapy intervention to reduce deficits and impairments identified in evaluation, in order to reduce pain, improve quality of life, and maximize activity tolerance for ADL, IADL, and leisure/fitness. Physical therapy will help pt achieve long and short term goals of care.    OBJECTIVE IMPAIRMENTS: Decreased knowledge of condition, decreased use of DME, decreased mobility, difficulty walking, decreased strength, decreased ROM. ACTIVITY LIMITATIONS: Lifting, standing, walking, squatting, transfers,  locomotion level PARTICIPATION LIMITATIONS: Cleaning, laundry, interpersonal relationships, driving, yardwork, community activity.  PERSONAL FACTORS: Age, behavior pattern, education, past/current experiences, transportation, profession  are also affecting patient's functional outcome.  REHAB POTENTIAL: Good CLINICAL DECISION MAKING: Medium  EVALUATION COMPLEXITY: Moderate    PLAN:  PT FREQUENCY: 1-2x/week  PT DURATION: 8 weeks  PLANNED INTERVENTIONS: 97110-Therapeutic exercises, 97530- Therapeutic activity, [id number]- Neuromuscular re-education, 97535- Self Care, [id number]- Manual therapy, [id number]- Gait training, [phone]- Electrical stimulation (unattended), [phone]- Electrical stimulation (manual), Patient/Family education, Balance training, Stair training, Cryotherapy, and Moist heat  PLAN FOR NEXT SESSION:   BLE  and functional core strengthening.  Continue balance training with narrow stance and reduced visual input  Nustep for AAROM and activity tolerance training.   Massie FORBES Dollar, PT 09/12/2024, 8:12 AM [facility]  Outpatient Physical Therapy- [facility] [phone]

## 2024-09-14 ENCOUNTER — Ambulatory Visit

## 2024-09-14 DIAGNOSIS — I951 Orthostatic hypotension: Secondary | ICD-10-CM | POA: Diagnosis not present

## 2024-09-14 DIAGNOSIS — R2681 Unsteadiness on feet: Secondary | ICD-10-CM

## 2024-09-14 NOTE — Therapy (Signed)
 OUTPATIENT PHYSICAL THERAPY TREATMENT Patient Name: Jonathon Snow MRN: [id number] DOB:December 14, 1961, 63 y.o., male Today's Date: 09/14/2024  PCP: [name], PA-C REFERRING PROVIDER: Maree Jannett POUR, MD  END OF SESSION:  PT End of Session - 09/14/24 0806     Visit Number 3    Number of Visits 16    Date for Recertification  11/02/24    Authorization Time Period 09/07/24-11/02/24    Progress Note Due on Visit 10    PT Start Time 0759    PT Stop Time 0840    PT Time Calculation (min) 41 min    Equipment Utilized During Treatment Gait belt    Activity Tolerance Patient tolerated treatment well;No increased pain    Behavior During Therapy [facility] for tasks assessed/performed          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   Overview:  superior sagittal sinus, left transverse sinus and cortical veins    Closed left hip fracture, initial encounter (HCC) 02/18/2023   COVID-19 virus infection 12/2020   Depression    DVT (deep venous thrombosis) (HCC)    Dyspnea    GERD (gastroesophageal reflux disease)    Head injury 11/05/2023   Heart attack (HCC) 11/05/2023   Heel spur, left 02/16/2019   Heel spur, right 02/16/2019   Hematoma of groin 11/08/2023   Herpes zoster infection of lumbar region 02/20/2020   History of DVT (deep vein thrombosis) 03/22/2020   History of kidney stones    HTN (hypertension)    Hyperlipidemia    Hypertension    Long term current use of systemic steroids 11/08/2020   Lupus    Lymphedema 10/07/2018   Morbid obesity (HCC)    Opiate abuse, episodic (HCC) 02/26/2018   Osteoporosis    Pain and swelling of right lower extremity 02/03/2023   Pneumonia    PONV (postoperative nausea and vomiting)    Postphlebitic syndrome with  ulcer, 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   Presence of IVC filter 03/22/2020   Removed   Pulmonary embolism (HCC)    Renal disorder    Stage III   Severe episode of recurrent major depressive disorder, 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   Shock circulatory (HCC) 11/06/2023   STEMI (ST elevation myocardial infarction) (HCC) 11/05/2023   Past Surgical History:  Procedure Laterality Date   ANKLE SURGERY Right    BACK SURGERY     BRONCHIAL WASHINGS N/A 11/01/2021   Procedure: BRONCHIAL WASHINGS;  Surgeon: Parris Manna, MD;  Location: [facility] ORS;  Service: Thoracic;  Laterality: N/A;   COLONOSCOPY WITH PROPOFOL  N/A 05/28/2020   Procedure: COLONOSCOPY WITH PROPOFOL ;  Surgeon: Therisa Bi, MD;  Location: [facility] ENDOSCOPY;  Service: Endoscopy;  Laterality: N/A;   COLONOSCOPY WITH PROPOFOL  N/A 07/01/2023   Procedure: COLONOSCOPY WITH PROPOFOL ;  Surgeon: Therisa Bi, MD;  Location: [facility] ENDOSCOPY;  Service: Gastroenterology;  Laterality: N/A;   CORONARY ULTRASOUND/IVUS N/A 11/05/2023   Procedure: Coronary Ultrasound/IVUS;  Surgeon: Mady Bruckner, MD;  Location: [facility] INVASIVE CV LAB;  Service: Cardiovascular;  Laterality: N/A;   CORONARY/GRAFT ACUTE MI REVASCULARIZATION N/A 11/05/2023   Procedure: Coronary/Graft Acute MI Revascularization;  Surgeon: Mady Bruckner, MD;  Location: [facility] INVASIVE CV LAB;  Service: Cardiovascular;  Laterality: N/A;   CYST EXCISION  92 or 93    Liver  cyst removal [facility]   FLEXIBLE BRONCHOSCOPY N/A 11/01/2021   Procedure: FLEXIBLE BRONCHOSCOPY;  Surgeon: Parris Manna, MD;  Location: [facility] ORS;  Service: Thoracic;  Laterality: N/A;   HIP PINNING,CANNULATED Left 02/19/2023   Procedure: PERCUTANEOUS FIXATION OF FEMORAL NECK;  Surgeon: Tobie Priest, MD;  Location: [facility] ORS;  Service: Orthopedics;  Laterality: Left;   I & D EXTREMITY Right 04/29/2017   Procedure: IRRIGATION AND DEBRIDEMENT EXTREMITY;  Surgeon: Shelva Dunnings, MD;  Location: [facility] ORS;  Service: General;   Laterality: Right;   IRRIGATION AND DEBRIDEMENT ABSCESS Left 04/29/2017   Procedure: IRRIGATION AND DEBRIDEMENT Scrotal ABSCESS;  Surgeon: Shelva Dunnings, MD;  Location: [facility] ORS;  Service: General;  Laterality: Left;   LEFT HEART CATH AND CORONARY ANGIOGRAPHY N/A 11/05/2023   Procedure: LEFT HEART CATH AND CORONARY ANGIOGRAPHY;  Surgeon: Mady Bruckner, MD;  Location: [facility] INVASIVE CV LAB;  Service: Cardiovascular;  Laterality: N/A;   POLYPECTOMY  07/01/2023   Procedure: POLYPECTOMY;  Surgeon: Therisa Bi, MD;  Location: [facility] ENDOSCOPY;  Service: Gastroenterology;;   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) 01/20/2024   Anxiety 12/14/2023   Overweight (BMI 25.0-29.9) 11/23/2023   Chronic heart failure with preserved ejection fraction (HFpEF) (HCC) 11/23/2023   BPH (benign prostatic hyperplasia) 11/23/2023   Unstable angina (HCC) 11/10/2023   Closed T10 spinal fracture (HCC) 11/06/2023   Closed tricolumnar fracture of thoracic vertebra (HCC) 11/06/2023   Thoracic spine instability 11/06/2023   Chronic bilateral low back pain with left-sided sciatica 07/07/2023   Neuropathy 07/07/2023   Hx of colonic polyps 07/01/2023   Adenomatous polyp of colon 07/01/2023   Erectile disorder 01/22/2023   Coronary artery disease involving native coronary artery of native heart without angina pectoris 09/09/2022   Varicose veins of left lower  extremity with inflammation 01/03/2022   Immunocompromised state due to drug therapy 09/18/2021   Prediabetes 11/08/2020   Stage 3a chronic kidney disease (HCC) 11/08/2020   Seasonal allergies 11/08/2020   Rhinosinusitis 11/08/2020   Anticoagulant disorder 11/07/2020   Senile purpura 11/07/2020   MDD (major depressive disorder), recurrent episode, mild 09/12/2019   Other spondylosis with radiculopathy, lumbar region 02/16/2019   Osteoporosis 10/12/2018   Chronic venous insufficiency 10/07/2018   Lymphedema 10/07/2018   SLE glomerulonephritis syndrome, 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HCC) 03/03/2018   Syncope and collapse 12/29/2017   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) 10/29/2016   Hyperlipidemia LDL goal <70 01/15/2016   Uncontrolled hypertension 01/15/2016   Obesity (BMI 30.0-34.9) 01/15/2016   Long term current use of anticoagulant 10/04/2015   Systemic lupus erythematosus (HCC) 05/30/2015   COPD, moderate (HCC) 06/27/2014   Shortness of breath 06/27/2014   History of pulmonary embolism 12/11/2013   Nodule of right lung 12/11/2013   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   Cystic disease of liver 08/21/2013   ONSET DATE: November 2024   REFERRING DIAG: imbalance  THERAPY DIAG:  Syncope due to orthostatic  hypotension  Unsteadiness on feet  Rationale for Evaluation and Treatment: Rehabilitation  SUBJECTIVE:                                                                                                                                                                                            SUBJECTIVE STATEMENT: Pt's pain quite exacerbated today. Does not know why. Yesterday was having some dizzy issues, mostly short term vertigo when rolling over in bed.   PERTINENT HISTORY:   62yoM referred to OPPT for imbalance. Pt just finished OPPT for post surgical neck pain on 08/30/24 at [facility] so he could begin his balance therapy  here. Pt has had issues with chronic orthostatic hypotension repeated syncope, fluctuating intolerance to upright. Pt has become quite good at managing his safety at home with symptoms fluctuations. PT checks BP daily, has PRN meds for excessively low or high pressures.   PAIN:  Are you having pain? Yes 7/10 low back, neck, RUE    PRECAUTIONS: Fall  WEIGHT BEARING RESTRICTIONS: No  FALLS: Has patient fallen in last 6 months? Yes 1x in March 2025  LIVING ENVIRONMENT: Lives with: Alone  Lives in: 1 level  Stairs: threshold only Has following equipment at home: RW, canes, walking sticks.   PLOF: 2 years ago was able to go hiking for pleasure ab lib; used to get in and out kayak and do multi day treks overnight with kayak.   PATIENT GOALS: improve tolerance to upright mobility, stop falling as much   OBJECTIVE:  Note: Objective measures were completed at evaluation unless otherwise noted.    FUNCTIONAL TESTS:  TUG: 34sec (armless chair)  5xSTS: 54.9sec with BUE heavy use arm chair  : 14.63 Normal stance eyes closed: 9/22: 5-10sec for 3 bouts  Narrow stance eyes closed: 9/22: 17-18 sec for 3 bouts   PATIENT SURVEYS:  ABC Scale: 47.5%                                                                                                                              TREATMENT DATE 09/14/24:  -  AA/ROM, cardioaerobic fitness on NUSTEP: seat 13, arms 13x8 minutes, level 1  Does not tolerate use of RUE due to pain.  -STS from 26 elevation x8 -seated brace marching 1x15 bilat  -STS from 26 elevation x8 -seated brace marching 1x15 bilat   -AMB overground to wellzone -Wellzone leg press: seat 12, plate 3, [id number] (minA for feet up/down)  -airex pad balance eye closed 2x10x3secH    PATIENT EDUCATION: Education details: Management recommendations for orthostatic hypotension  Person educated: Programmer, applications: Discussion, handout Education comprehension: Fair   HOME EXERCISE  PROGRAM: Access Code: [email or web address] URL: [email or web address] Date: 09/12/2024 Prepared by: Massie Dollar  Exercises - sit to stand, arms crossed, 28-30 inch surface   - 3 x daily - 1 sets - 8 reps - Standing Near Stance in Corner with Eyes Closed  - 3 x daily - 1 sets - 5 reps - 30 hold - Semi-Tandem Balance at Counter Top Eyes Open  - 1 x daily - 7 x weekly - 3 sets - 4 reps - 15 hold - Standing March with Counter Support  - 1 x daily - 7 x weekly - 3 sets - 10 reps - Seated Long Arc Quad  - 1 x daily - 7 x weekly - 3 sets - 10 reps - Backward Walking with Counter Support  - 1 x daily - 7 x weekly - 3 sets - 5 reps  GOALS: Goals reviewed with patient? No  SHORT TERM GOALS: Target date: 10/07/24  Pt to report no falls in most recent 4 weeks to indicate good safety practices and symptoms management in the home.  Baseline: reports orthostatic s/s roughly 2x per week.  Goal status: INITIAL  2.  Pt will demonstrate improvement in 5xSTS performance by >3 seconds.  Baseline: 54.9sec with BUE heavy use arm chair  Goal status: INITIAL  3.  Pt to demonstrate improvement in by 20% and backwards by 25%  Baseline: 9/22: forward: 14.63 sec (0.75m/s). Backward: 49.04 sec 0.61m/s  Goal status: INITIAL  LONG TERM GOALS: Target date: 11/07/24  Pt to demonstrate tolerance of >1029ft sustained AMB c LRAD without presyncope, without LOB, and without increase in back pain >2/10 points NPRS.  Baseline: tolerated 364ft with mild-moderate increase in L hip and low back pain.  Goal status: INITIAL  2.  5xSTS performance, standard chair, hands free <20sec; hands free from 19.5 surface height in <12sec.   Baseline:  54.9sec with BUE heavy use arm chair  Goal status: INITIAL  3.  Eye closed in narrow stance x60sec (5x without LOB and without need for hand support)  Baseline: 4-10 sec  Goal status: INITIAL  ASSESSMENT:  CLINICAL IMPRESSION: Pt returns for visit 3, more  limited by pain today. Pt trial of leg press today to work deep range hip extension and work on lower transfers. RUE not tolerant of Nustpe due to radicular pain issues, but pt uses LUE only. Patient will benefit from skilled physical therapy intervention to reduce deficits and impairments identified in evaluation, in order to reduce pain, improve quality of life, and maximize activity tolerance for ADL, IADL, and leisure/fitness. Physical therapy will help pt achieve long and short term goals of care.    OBJECTIVE IMPAIRMENTS: Decreased knowledge of condition, decreased use of DME, decreased mobility, difficulty walking, decreased strength, decreased ROM. ACTIVITY LIMITATIONS: Lifting, standing, walking, squatting, transfers, locomotion level PARTICIPATION LIMITATIONS: Cleaning, laundry, interpersonal relationships, driving, yardwork, community activity.  PERSONAL FACTORS: Age, behavior pattern, education,  past/current experiences, transportation, profession  are also affecting patient's functional outcome.  REHAB POTENTIAL: Good CLINICAL DECISION MAKING: Medium  EVALUATION COMPLEXITY: Moderate    PLAN:  PT FREQUENCY: 1-2x/week PT DURATION: 8 weeks PLANNED INTERVENTIONS: 97110-Therapeutic exercises, 97530- Therapeutic activity, 97112- Neuromuscular re-education, [phone]- Self Care, 02859- Manual therapy, [phone]- Gait training, [phone]- Electrical stimulation (unattended), [phone]- Electrical stimulation (manual), Patient/Family education, Balance training, Stair training, Cryotherapy, and Moist heat  PLAN FOR NEXT SESSION:  -conitnued leg press, Nustep, and ba;ance training.    8:09 AM, 09/14/24 Peggye JAYSON Linear, PT, DPT Physical Therapist - [facility]  Outpatient Physical Therapy- Main Campus [phone]

## 2024-09-15 ENCOUNTER — Ambulatory Visit (INDEPENDENT_AMBULATORY_CARE_PROVIDER_SITE_OTHER): Admitting: Nurse Practitioner

## 2024-09-15 ENCOUNTER — Encounter (INDEPENDENT_AMBULATORY_CARE_PROVIDER_SITE_OTHER): Payer: Self-pay

## 2024-09-15 VITALS — BP 124/84 | HR 76 | Resp 18 | Ht 74.0 in | Wt 217.0 lb

## 2024-09-15 DIAGNOSIS — N2581 Secondary hyperparathyroidism of renal origin: Secondary | ICD-10-CM | POA: Diagnosis not present

## 2024-09-15 DIAGNOSIS — H9042 Sensorineural hearing loss, unilateral, left ear, with unrestricted hearing on the contralateral side: Secondary | ICD-10-CM | POA: Diagnosis not present

## 2024-09-15 DIAGNOSIS — I890 Lymphedema, not elsewhere classified: Secondary | ICD-10-CM

## 2024-09-15 DIAGNOSIS — H938X2 Other specified disorders of left ear: Secondary | ICD-10-CM | POA: Diagnosis not present

## 2024-09-15 DIAGNOSIS — Z92241 Personal history of systemic steroid therapy: Secondary | ICD-10-CM | POA: Diagnosis not present

## 2024-09-15 DIAGNOSIS — M816 Localized osteoporosis [Lequesne]: Secondary | ICD-10-CM | POA: Diagnosis not present

## 2024-09-15 NOTE — Progress Notes (Signed)
 Patient presented for Unna wrap, no open areas or weeping on lower left leg. He reports he removed his wraps 3 days ago at home. Per NP pt to be discharged from wraps and to follow up with Dr Marea in 1 month. Patient verbalized understanding and to call if he has any issues.   Dois Seip CMA

## 2024-09-16 ENCOUNTER — Ambulatory Visit: Admitting: Physical Therapy

## 2024-09-18 ENCOUNTER — Encounter (INDEPENDENT_AMBULATORY_CARE_PROVIDER_SITE_OTHER): Payer: Self-pay | Admitting: Nurse Practitioner

## 2024-09-19 ENCOUNTER — Ambulatory Visit

## 2024-09-19 ENCOUNTER — Ambulatory Visit: Admitting: Nurse Practitioner

## 2024-09-19 ENCOUNTER — Encounter: Payer: Self-pay | Admitting: Nurse Practitioner

## 2024-09-19 VITALS — BP 142/78 | HR 73 | Resp 16 | Ht 74.0 in | Wt 223.0 lb

## 2024-09-19 DIAGNOSIS — I951 Orthostatic hypotension: Secondary | ICD-10-CM | POA: Diagnosis not present

## 2024-09-19 DIAGNOSIS — Z0000 Encounter for general adult medical examination without abnormal findings: Secondary | ICD-10-CM

## 2024-09-19 DIAGNOSIS — Z23 Encounter for immunization: Secondary | ICD-10-CM | POA: Diagnosis not present

## 2024-09-19 DIAGNOSIS — M5412 Radiculopathy, cervical region: Secondary | ICD-10-CM

## 2024-09-19 DIAGNOSIS — I10 Essential (primary) hypertension: Secondary | ICD-10-CM

## 2024-09-19 DIAGNOSIS — Z0001 Encounter for general adult medical examination with abnormal findings: Secondary | ICD-10-CM | POA: Diagnosis not present

## 2024-09-19 DIAGNOSIS — R2681 Unsteadiness on feet: Secondary | ICD-10-CM

## 2024-09-19 DIAGNOSIS — R195 Other fecal abnormalities: Secondary | ICD-10-CM

## 2024-09-19 DIAGNOSIS — M542 Cervicalgia: Secondary | ICD-10-CM

## 2024-09-19 NOTE — Therapy (Signed)
 OUTPATIENT PHYSICAL THERAPY TREATMENT Patient Name: Jonathon Snow MRN: [id number] DOB:Mar 27, 1961, 63 y.o., male Today's Date: 09/19/2024  PCP: [name], PA-C REFERRING PROVIDER: Maree Jannett POUR, MD  END OF SESSION:  PT End of Session - 09/19/24 0805     Visit Number 4    Number of Visits 16    Date for Recertification  11/02/24    Authorization Time Period 09/07/24-11/02/24    Progress Note Due on Visit 10    PT Start Time 0800    PT Stop Time 0840    PT Time Calculation (min) 40 min    Equipment Utilized During Treatment Gait belt    Activity Tolerance Patient tolerated treatment well;No increased pain    Behavior During Therapy [facility] for tasks assessed/performed          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   Overview:  superior sagittal sinus, left transverse sinus and cortical veins    Closed left hip fracture, initial encounter (HCC) 02/18/2023   COVID-19 virus infection 12/2020   Depression    DVT (deep venous thrombosis) (HCC)    Dyspnea    GERD (gastroesophageal reflux disease)    Head injury 11/05/2023   Heart attack (HCC) 11/05/2023   Heel spur, left 02/16/2019   Heel spur, right 02/16/2019   Hematoma of groin 11/08/2023   Herpes zoster infection of lumbar region 02/20/2020   History of DVT (deep vein thrombosis) 03/22/2020   History of kidney stones    HTN (hypertension)    Hyperlipidemia    Hypertension    Long term current use of systemic steroids 11/08/2020   Lupus    Lymphedema 10/07/2018   Morbid obesity (HCC)    Opiate abuse, episodic (HCC) 02/26/2018   Osteoporosis    Pain and swelling of right lower extremity 02/03/2023   Pneumonia    PONV (postoperative nausea and vomiting)    Postphlebitic syndrome with  ulcer, 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   Presence of IVC filter 03/22/2020   Removed   Pulmonary embolism (HCC)    Renal disorder    Stage III   Severe episode of recurrent major depressive disorder, 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   Shock circulatory (HCC) 11/06/2023   STEMI (ST elevation myocardial infarction) (HCC) 11/05/2023   Past Surgical History:  Procedure Laterality Date   ANKLE SURGERY Right    BACK SURGERY     BRONCHIAL WASHINGS N/A 11/01/2021   Procedure: BRONCHIAL WASHINGS;  Surgeon: Parris Manna, MD;  Location: [facility] ORS;  Service: Thoracic;  Laterality: N/A;   COLONOSCOPY WITH PROPOFOL  N/A 05/28/2020   Procedure: COLONOSCOPY WITH PROPOFOL ;  Surgeon: Therisa Bi, MD;  Location: [facility] ENDOSCOPY;  Service: Endoscopy;  Laterality: N/A;   COLONOSCOPY WITH PROPOFOL  N/A 07/01/2023   Procedure: COLONOSCOPY WITH PROPOFOL ;  Surgeon: Therisa Bi, MD;  Location: [facility] ENDOSCOPY;  Service: Gastroenterology;  Laterality: N/A;   CORONARY ULTRASOUND/IVUS N/A 11/05/2023   Procedure: Coronary Ultrasound/IVUS;  Surgeon: Mady Bruckner, MD;  Location: [facility] INVASIVE CV LAB;  Service: Cardiovascular;  Laterality: N/A;   CORONARY/GRAFT ACUTE MI REVASCULARIZATION N/A 11/05/2023   Procedure: Coronary/Graft Acute MI Revascularization;  Surgeon: Mady Bruckner, MD;  Location: [facility] INVASIVE CV LAB;  Service: Cardiovascular;  Laterality: N/A;   CYST EXCISION  92 or 93    Liver  cyst removal [facility]   FLEXIBLE BRONCHOSCOPY N/A 11/01/2021   Procedure: FLEXIBLE BRONCHOSCOPY;  Surgeon: Parris Manna, MD;  Location: [facility] ORS;  Service: Thoracic;  Laterality: N/A;   HIP PINNING,CANNULATED Left 02/19/2023   Procedure: PERCUTANEOUS FIXATION OF FEMORAL NECK;  Surgeon: Tobie Priest, MD;  Location: [facility] ORS;  Service: Orthopedics;  Laterality: Left;   I & D EXTREMITY Right 04/29/2017   Procedure: IRRIGATION AND DEBRIDEMENT EXTREMITY;  Surgeon: Shelva Dunnings, MD;  Location: [facility] ORS;  Service: General;   Laterality: Right;   IRRIGATION AND DEBRIDEMENT ABSCESS Left 04/29/2017   Procedure: IRRIGATION AND DEBRIDEMENT Scrotal ABSCESS;  Surgeon: Shelva Dunnings, MD;  Location: [facility] ORS;  Service: General;  Laterality: Left;   LEFT HEART CATH AND CORONARY ANGIOGRAPHY N/A 11/05/2023   Procedure: LEFT HEART CATH AND CORONARY ANGIOGRAPHY;  Surgeon: Mady Bruckner, MD;  Location: [facility] INVASIVE CV LAB;  Service: Cardiovascular;  Laterality: N/A;   POLYPECTOMY  07/01/2023   Procedure: POLYPECTOMY;  Surgeon: Therisa Bi, MD;  Location: [facility] ENDOSCOPY;  Service: Gastroenterology;;   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) 01/20/2024   Anxiety 12/14/2023   Overweight (BMI 25.0-29.9) 11/23/2023   Chronic heart failure with preserved ejection fraction (HFpEF) (HCC) 11/23/2023   BPH (benign prostatic hyperplasia) 11/23/2023   Unstable angina (HCC) 11/10/2023   Closed T10 spinal fracture (HCC) 11/06/2023   Closed tricolumnar fracture of thoracic vertebra (HCC) 11/06/2023   Thoracic spine instability 11/06/2023   Chronic bilateral low back pain with left-sided sciatica 07/07/2023   Neuropathy 07/07/2023   Hx of colonic polyps 07/01/2023   Adenomatous polyp of colon 07/01/2023   Erectile disorder 01/22/2023   Coronary artery disease involving native coronary artery of native heart without angina pectoris 09/09/2022   Varicose veins of left lower  extremity with inflammation 01/03/2022   Immunocompromised state due to drug therapy 09/18/2021   Prediabetes 11/08/2020   Stage 3a chronic kidney disease (HCC) 11/08/2020   Seasonal allergies 11/08/2020   Rhinosinusitis 11/08/2020   Anticoagulant disorder 11/07/2020   Senile purpura 11/07/2020   MDD (major depressive disorder), recurrent episode, mild 09/12/2019   Other spondylosis with radiculopathy, lumbar region 02/16/2019   Osteoporosis 10/12/2018   Chronic venous insufficiency 10/07/2018   Lymphedema 10/07/2018   SLE glomerulonephritis syndrome, 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HCC) 03/03/2018   Syncope and collapse 12/29/2017   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) 10/29/2016   Hyperlipidemia LDL goal <70 01/15/2016   Uncontrolled hypertension 01/15/2016   Obesity (BMI 30.0-34.9) 01/15/2016   Long term current use of anticoagulant 10/04/2015   Systemic lupus erythematosus (HCC) 05/30/2015   COPD, moderate (HCC) 06/27/2014   Shortness of breath 06/27/2014   History of pulmonary embolism 12/11/2013   Nodule of right lung 12/11/2013   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   Cystic disease of liver 08/21/2013   ONSET DATE: November 2024   REFERRING DIAG: imbalance  THERAPY DIAG:  Syncope due to orthostatic  hypotension  Unsteadiness on feet  Cervicalgia  Radiculopathy, cervical region  Rationale for Evaluation and Treatment: Rehabilitation  SUBJECTIVE:                                                                                                                                                                                            SUBJECTIVE STATEMENT: No updates today. HEP as planned. Pain as is normal.   PERTINENT HISTORY:   62yoM referred to OPPT for imbalance. Pt just finished OPPT for post surgical neck pain on 08/30/24 at [facility] so he could begin his balance therapy here. Pt has had issues with chronic  orthostatic hypotension repeated syncope, fluctuating intolerance to upright. Pt has become quite good at managing his safety at home with symptoms fluctuations. PT checks BP daily, has PRN meds for excessively low or high pressures.   PAIN:  Are you having pain? 6/10     PRECAUTIONS: Fall  WEIGHT BEARING RESTRICTIONS: No  FALLS: Has patient fallen in last 6 months? Yes 1x in March 2025  LIVING ENVIRONMENT: Lives with: Alone  Lives in: 1 level  Stairs: threshold only Has following equipment at home: RW, canes, walking sticks.   PLOF: 2 years ago was able to go hiking for pleasure ab lib; used to get in and out kayak and do multi day treks overnight with kayak.   PATIENT GOALS: improve tolerance to upright mobility, stop falling as much   OBJECTIVE:  Note: Objective measures were completed at evaluation unless otherwise noted.    FUNCTIONAL TESTS:  TUG: 34sec (armless chair)  5xSTS: 54.9sec with BUE heavy use arm chair  : 14.63 Normal stance eyes closed: 9/22: 5-10sec for 3 bouts  Narrow stance eyes closed: 9/22: 17-18 sec for 3 bouts   PATIENT SURVEYS:  ABC Scale: 47.5%                                                                                                                              TREATMENT DATE 09/19/24:  -AA/ROM, cardioaerobic fitness on NUSTEP: seat 13, arms 13x8 minutes, level  2  avoided use of RUE due to radicular history.    *AMB overground to wellzone -[facility] leg press: seat 12, plate 3, k84 -standing at leg press, double heel raise x15 -standing at leg press, alternate marching x15 -[employer] leg press: seat 12, plate 3, k84 -standing at leg press, double heel raise x15 -standing at leg press, alternate marching x15 -[facility] leg press: seat 12, plate 3, k84 *AMB back to rehab gym  -sidestepping in // bars 5x bilat, hands-free when able *sit break   -alternate forward backward AMB in // bars 5x each, single UE support on bars  *sit break     -alternating 180 degrees turns in // bars, hands free when able x20    PATIENT EDUCATION: Education details: Management recommendations for orthostatic hypotension  Person educated: Programmer, applications: Discussion, handout Education comprehension: Fair   HOME EXERCISE PROGRAM: Access Code: [email or web address] URL: [email or web address] Date: 09/12/2024 Prepared by: Massie Dollar  Exercises - sit to stand, arms crossed, 28-30 inch surface   - 3 x daily - 1 sets - 8 reps - Standing Near Stance in Corner with Eyes Closed  - 3 x daily - 1 sets - 5 reps - 30 hold - Semi-Tandem Balance at Counter Top Eyes Open  - 1 x daily - 7 x weekly - 3 sets - 4 reps - 15 hold - Standing March with Counter Support  - 1 x daily - 7 x weekly - 3 sets - 10 reps - Seated Long Arc Quad  - 1 x daily - 7 x weekly - 3 sets - 10 reps - Backward Walking with Counter Support  - 1 x daily - 7 x weekly - 3 sets - 5 reps  GOALS: Goals reviewed with patient? No  SHORT TERM GOALS: Target date: 10/07/24  Pt to report no falls in most recent 4 weeks to indicate good safety practices and symptoms management in the home.  Baseline: reports orthostatic s/s roughly 2x per week.  Goal status: INITIAL  2.  Pt will demonstrate improvement in 5xSTS performance by >3 seconds.  Baseline: 54.9sec with BUE heavy use arm chair  Goal status: INITIAL  3.  Pt to demonstrate improvement in by 20% and backwards by 25%  Baseline: 9/22: forward: 14.63 sec (0.61m/s). Backward: 49.04 sec 0.93m/s  Goal status: INITIAL  LONG TERM GOALS: Target date: 11/07/24  Pt to demonstrate tolerance of >1053ft sustained AMB c LRAD without presyncope, without LOB, and without increase in back pain >2/10 points NPRS.  Baseline: tolerated 344ft with mild-moderate increase in L hip and low back pain.  Goal status: INITIAL  2.  5xSTS performance, standard chair, hands free <20sec; hands free from 19.5 surface height in  <12sec.   Baseline:  54.9sec with BUE heavy use arm chair  Goal status: INITIAL  3.  Eye closed in narrow stance x60sec (5x without LOB and without need for hand support)  Baseline: 4-10 sec  Goal status: INITIAL  ASSESSMENT:  CLINICAL IMPRESSION: Continued with leg press today to work deep range hip extension and work on lower transfers. Commenced hands free multi direction gait in // bars today, side stepping does cause some rafdiaulr pain. Patient will benefit from skilled physical therapy intervention to reduce deficits and impairments identified in evaluation, in order to reduce pain, improve quality of life, and maximize activity tolerance for ADL, IADL, and leisure/fitness. Physical therapy will help pt achieve long and short term goals of care.  OBJECTIVE IMPAIRMENTS: Decreased knowledge of condition, decreased use of DME, decreased mobility, difficulty walking, decreased strength, decreased ROM. ACTIVITY LIMITATIONS: Lifting, standing, walking, squatting, transfers, locomotion level PARTICIPATION LIMITATIONS: Cleaning, laundry, interpersonal relationships, driving, yardwork, community activity.  PERSONAL FACTORS: Age, behavior pattern, education, past/current experiences, transportation, profession  are also affecting patient's functional outcome.  REHAB POTENTIAL: Good CLINICAL DECISION MAKING: Medium  EVALUATION COMPLEXITY: Moderate    PLAN:  PT FREQUENCY: 1-2x/week PT DURATION: 8 weeks PLANNED INTERVENTIONS: 97110-Therapeutic exercises, 97530- Therapeutic activity, 97112- Neuromuscular re-education, [phone]- Self Care, 02859- Manual therapy, [phone]- Gait training, [phone]- Electrical stimulation (unattended), [phone]- Electrical stimulation (manual), Patient/Family education, Balance training, Stair training, Cryotherapy, and Moist heat  PLAN FOR NEXT SESSION:  -conitnued leg press, Nustep, and balance training.    8:07 AM, 09/19/24 Peggye JAYSON Linear, PT, DPT Physical  Therapist - [facility]  Outpatient Physical Therapy- [facility] [phone]

## 2024-09-19 NOTE — Progress Notes (Signed)
 Name: Jonathon Snow   MRN: [id number]    DOB: 1961/08/22   Date:09/19/2024       Progress Note  Subjective  Chief Complaint  Chief Complaint  Patient presents with   Annual Exam    HPI  Patient presents for annual CPE.  Diet: Regular diet Exercise: 30 minutes per day Sleep: 4 hours of sleep per night Last dental exam: 1 month ago Last eye exam: Does not remember when he had his last visit, needs to get vision checked.  -Endorses dark colored stool for the past several weeks. Stools are not black. Denies seeing any bright red blood. Denies any pain. Discussed with sending patient home with stool cards to assess for blood in the stool. Recent cbc was normal. colon cancer screening up to date.    IPSS score indicates severe BPH symptoms. Seen by urology. Last PSA was normal.  IPSS     Row Name 09/19/24 1331         International Prostate Symptom Score   How often have you had the sensation of not emptying your bladder? Almost always     How often have you had to urinate less than every two hours? Almost always     How often have you found you stopped and started again several times when you urinated? Almost always     How often have you found it difficult to postpone urination? Not at All     How often have you had a weak urinary stream? About half the time     How often have you had to strain to start urination? About half the time     How many times did you typically get up at night to urinate? 2 Times     Total IPSS Score 23         Depression: phq 9 is positive. Treated for depression and seen by psychiatry.    09/19/2024    1:26 PM 09/05/2024    8:12 AM 08/24/2024    2:00 PM 08/15/2024    5:09 PM 08/03/2024   11:00 AM  Depression screen PHQ 2/9  Decreased Interest 2 0 3  1  Down, Depressed, Hopeless 3 0 3  2  PHQ - 2 Score 5 0 6  3  Altered sleeping 3  3  3   Tired, decreased energy 3  3  3   Change in appetite 0  2  2  Feeling bad or failure about yourself  0  2   3  Trouble concentrating 0  2  1  Moving slowly or fidgety/restless 0  2  2  Suicidal thoughts 0  0  0  PHQ-9 Score 11  20  17   Difficult doing work/chores   Very difficult  Extremely dIfficult     Information is confidential and restricted. Go to Review Flowsheets to unlock data.    Hypertension:  BP Readings from Last 3 Encounters:  09/19/24 (!) 142/78  09/15/24 124/84  09/08/24 (!) 162/92    Obesity: Wt Readings from Last 3 Encounters:  09/19/24 223 lb (101.2 kg)  09/15/24 217 lb (98.4 kg)  09/08/24 217 lb 3.2 oz (98.5 kg)   BMI Readings from Last 3 Encounters:  09/19/24 28.63 kg/m  09/15/24 27.86 kg/m  09/08/24 27.89 kg/m     Lipids:  Lab Results  Component Value Date   CHOL 199 11/05/2023   CHOL 225 (H) 09/09/2022   CHOL 139 09/18/2021   Lab Results  Component Value  Date   HDL 59 11/05/2023   HDL 56 09/09/2022   HDL 45 09/18/2021   Lab Results  Component Value Date   LDLCALC 119 (H) 11/05/2023   LDLCALC 150 (H) 09/09/2022   LDLCALC 76 09/18/2021   Lab Results  Component Value Date   TRIG 105 11/05/2023   TRIG 86 09/09/2022   TRIG 92 09/18/2021   Lab Results  Component Value Date   CHOLHDL 3.4 11/05/2023   CHOLHDL 4.0 09/09/2022   CHOLHDL 3.1 09/18/2021   No results found for: LDLDIRECT Glucose:  Glucose  Date Value Ref Range Status  04/10/2015 96 mg/dL Final    Comment:    34-00 NOTE: New Reference Range  02/27/15   10/05/2014 106 (H) 65 - 99 mg/dL Final  91/82/7984 888 (H) 65 - 99 mg/dL Final   Glucose, Bld  Date Value Ref Range Status  07/28/2024 92 65 - 99 mg/dL Final    Comment:    .            Fasting reference interval .   02/27/2024 91 70 - 99 mg/dL Final    Comment:    Glucose reference range applies only to samples taken after fasting for at least 8 hours.  02/26/2024 145 (H) 70 - 99 mg/dL Final    Comment:    Glucose reference range applies only to samples taken after fasting for at least 8 hours.    Glucose-Capillary  Date Value Ref Range Status  02/27/2024 137 (H) 70 - 99 mg/dL Final    Comment:    Glucose reference range applies only to samples taken after fasting for at least 8 hours.  02/26/2024 136 (H) 70 - 99 mg/dL Final    Comment:    Glucose reference range applies only to samples taken after fasting for at least 8 hours.  02/25/2024 87 70 - 99 mg/dL Final    Comment:    Glucose reference range applies only to samples taken after fasting for at least 8 hours.    Flowsheet Row Office Visit from 09/19/2024 in [facility]  AUDIT-C Score 0    Divorced STD testing and prevention (HIV/chl/gon/syphilis): Completed 10/28/2023 Hep C: Completed 07/28/2018  Skin cancer: Discussed monitoring for atypical lesions Colorectal cancer: Colonoscopy completed 07/01/2023 Prostate cancer: Last PSA serum was 0.4 on 03/18/2024 Lab Results  Component Value Date   PSA 1.0 07/28/2018   PSA 0.6 11/26/2016   PSA 0.4 07/14/2013     Lung cancer:  Low Dose CT Chest recommended if Age 27-80 years, 30 pack-year currently smoking OR have quit w/in 15years. Patient does not qualify.   AAA: The USPSTF recommends one-time screening with ultrasonography in men ages 4 to 75 years who have ever smoked. Patient does not qualify. ECG:  05/03/2024  Vaccines:  HPV: up to at age 63 , ask insurance if age between 50-45  Shingrix: 97-63 yo and ask insurance if covered when patient above 23 yo Pneumonia: Educated and discussed with patient. Received on 09/12/2062. Flu: Educated and discussed with patient. Got flu shot during this visit.  Advanced Care Planning: A voluntary discussion about advance care planning including the explanation and discussion of advance directives.  Discussed health care proxy and Living will, and the patient was able to identify a health care proxy as daughter Gwynn Homme).  Patient does not know have a living will at present time. If patient does  have living will, I have requested they bring this to the  clinic to be scanned in to their chart.  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) 01/20/2024   Anxiety 12/14/2023   Overweight (BMI 25.0-29.9) 11/23/2023   Chronic heart failure with preserved ejection fraction (HFpEF) (HCC) 11/23/2023   BPH (benign prostatic hyperplasia) 11/23/2023   Unstable angina (HCC) 11/10/2023   Closed T10 spinal fracture (HCC) 11/06/2023   Closed tricolumnar fracture of thoracic vertebra (HCC) 11/06/2023   Thoracic spine instability 11/06/2023   Chronic bilateral low back pain with left-sided sciatica 07/07/2023   Neuropathy 07/07/2023   Hx of colonic polyps 07/01/2023   Adenomatous polyp of colon 07/01/2023   Erectile disorder 01/22/2023   Coronary artery disease involving native coronary artery of native heart without angina pectoris 09/09/2022   Varicose 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 01/03/2022   Immunocompromised state due to drug therapy 09/18/2021   Prediabetes 11/08/2020   Stage 3a chronic kidney disease (HCC) 11/08/2020   Seasonal allergies 11/08/2020   Rhinosinusitis 11/08/2020   Anticoagulant disorder 11/07/2020   Senile purpura 11/07/2020   MDD (major depressive disorder), recurrent episode, mild 09/12/2019   Other spondylosis with radiculopathy, lumbar region 02/16/2019   Osteoporosis 10/12/2018   Chronic venous  insufficiency 10/07/2018   Lymphedema 10/07/2018   SLE glomerulonephritis syndrome, 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HCC) 03/03/2018   Syncope and collapse 12/29/2017   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) 10/29/2016   Hyperlipidemia LDL goal <70 01/15/2016   Uncontrolled hypertension 01/15/2016   Obesity (BMI 30.0-34.9) 01/15/2016   Long term current use of anticoagulant 10/04/2015   Systemic lupus erythematosus (HCC) 05/30/2015   COPD, moderate (HCC) 06/27/2014   Shortness of breath 06/27/2014   History of pulmonary embolism 12/11/2013   Nodule of right lung 12/11/2013   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   Cystic disease of liver 08/21/2013    Past Surgical History:  Procedure Laterality Date   ANKLE SURGERY Right    BACK SURGERY     BRONCHIAL WASHINGS N/A 11/01/2021   Procedure: BRONCHIAL WASHINGS;  Surgeon: Parris Manna, MD;  Location: [facility] ORS;  Service: Thoracic;  Laterality: N/A;   COLONOSCOPY WITH PROPOFOL  N/A 05/28/2020   Procedure: COLONOSCOPY WITH PROPOFOL ;  Surgeon: Therisa Bi, MD;  Location: [facility] ENDOSCOPY;  Service: Endoscopy;  Laterality: N/A;   COLONOSCOPY WITH PROPOFOL  N/A 07/01/2023   Procedure: COLONOSCOPY WITH PROPOFOL ;  Surgeon: Therisa Bi, MD;  Location: [facility] ENDOSCOPY;  Service: Gastroenterology;  Laterality: N/A;   CORONARY ULTRASOUND/IVUS N/A 11/05/2023   Procedure: Coronary Ultrasound/IVUS;  Surgeon: Mady Bruckner, MD;  Location: [facility] INVASIVE CV LAB;  Service: Cardiovascular;  Laterality: N/A;   CORONARY/GRAFT ACUTE MI REVASCULARIZATION N/A 11/05/2023   Procedure: Coronary/Graft Acute MI Revascularization;  Surgeon: Mady Bruckner, MD;  Location: [facility] INVASIVE CV LAB;  Service: Cardiovascular;  Laterality: N/A;   CYST EXCISION  92 or 93    Liver cyst removal [facility]   FLEXIBLE BRONCHOSCOPY N/A 11/01/2021   Procedure: FLEXIBLE  BRONCHOSCOPY;  Surgeon: Parris Manna, MD;  Location: [facility] ORS;  Service:  Thoracic;  Laterality: N/A;   HIP PINNING,CANNULATED Left 02/19/2023   Procedure: PERCUTANEOUS FIXATION OF FEMORAL NECK;  Surgeon: Tobie Priest, MD;  Location: [facility] ORS;  Service: Orthopedics;  Laterality: Left;   I & D EXTREMITY Right 04/29/2017   Procedure: IRRIGATION AND DEBRIDEMENT EXTREMITY;  Surgeon: Shelva Dunnings, MD;  Location: [facility] ORS;  Service: General;  Laterality: Right;   IRRIGATION AND DEBRIDEMENT ABSCESS Left 04/29/2017   Procedure: IRRIGATION AND DEBRIDEMENT Scrotal ABSCESS;  Surgeon: Shelva Dunnings, MD;  Location: [facility] ORS;  Service: General;  Laterality: Left;   LEFT HEART CATH AND CORONARY ANGIOGRAPHY N/A 11/05/2023   Procedure: LEFT HEART CATH AND CORONARY ANGIOGRAPHY;  Surgeon: Mady Bruckner, MD;  Location: [facility] INVASIVE CV LAB;  Service: Cardiovascular;  Laterality: N/A;   POLYPECTOMY  07/01/2023   Procedure: POLYPECTOMY;  Surgeon: Therisa Bi, MD;  Location: [facility] ENDOSCOPY;  Service: Gastroenterology;;    Family History  Problem Relation Age of Onset   Hypertension Father    Heart disease Father    Clotting disorder Mother    Kidney disease Brother    Heart attack Maternal Grandmother    Heart attack Maternal Grandfather    Heart attack Paternal Grandfather     Social History   Socioeconomic History   Marital status: Divorced    Spouse name: Not on file   Number of children: 2   Years of education: Not on file   Highest education level: Some college, no degree  Occupational History   Not on file  Tobacco Use   Smoking status: Never   Smokeless tobacco: Never  Vaping Use   Vaping status: Never Used  Substance and Sexual Activity   Alcohol use: No   Drug use: No    Comment: PT DENIES   Sexual activity: Yes  Other Topics Concern   Not on file  Social History Narrative   Not on file   Social Drivers of Health   Financial Resource Strain: Medium Risk (09/19/2024)   Overall Financial Resource Strain (CARDIA)    Difficulty of Paying Living  Expenses: Somewhat hard  Food Insecurity: Patient Declined (09/19/2024)   Hunger Vital Sign    Worried About Running Out of Food in the Last Year: Patient declined    Ran Out of Food in the Last Year: Patient declined  Recent Concern: Food Insecurity - Food Insecurity Present (08/03/2024)   Hunger Vital Sign    Worried About Running Out of Food in the Last Year: Sometimes true    Ran Out of Food in the Last Year: Never true  Transportation Needs: No Transportation Needs (09/19/2024)   PRAPARE - Administrator, Civil Service (Medical): No    Lack of Transportation (Non-Medical): No  Physical Activity: Insufficiently Active (09/19/2024)   Exercise Vital Sign    Days of Exercise per Week: 1 day    Minutes of Exercise per Session: 30 min  Stress: Stress Concern Present (09/19/2024)   [employer] of Occupational Health - Occupational Stress Questionnaire    Feeling of Stress: Rather much  Social Connections: Socially Isolated (09/19/2024)   Social Connection and Isolation Panel    Frequency of Communication with Friends and Family: Three times a week    Frequency of Social Gatherings with Friends and Family: Patient declined    Attends Religious Services: Never    Database administrator or Organizations: No    Attends Banker  Meetings: Not on file    Marital Status: Divorced  Intimate Partner Violence: Not At Risk (08/24/2024)   Humiliation, Afraid, Rape, and Kick questionnaire    Fear of Current or Ex-Partner: No    Emotionally Abused: No    Physically Abused: No    Sexually Abused: No     Current Outpatient Medications:    albuterol  (VENTOLIN  HFA) 108 (90 Base) MCG/ACT inhaler, Inhale 2 puffs into the lungs every 6 (six) hours as needed for wheezing or shortness of breath., Disp: , Rfl:    apixaban  (ELIQUIS ) 5 MG TABS tablet, Take 1 tablet (5 mg total) by mouth 2 (two) times daily., Disp: 180 tablet, Rfl: 1   ARIPiprazole  (ABILIFY ) 2 MG tablet, Take 4 mg by  mouth daily., Disp: , Rfl:    azelastine (ASTELIN) 0.1 % nasal spray, 2 sprays 2 (two) times daily., Disp: , Rfl:    BENLYSTA 200 MG/ML SOSY, , Disp: , Rfl:    busPIRone  (BUSPAR ) 5 MG tablet, Take 1 tablet (5 mg total) by mouth 2 (two) times daily., Disp: 60 tablet, Rfl: 1   carbidopa-levodopa (SINEMET IR) 25-100 MG tablet, Take by mouth., Disp: , Rfl:    cholecalciferol  (VITAMIN D3) 25 MCG (1000 UNIT) tablet, Take 1,000 Units by mouth daily., Disp: , Rfl:    clonazePAM  (KLONOPIN ) 0.5 MG tablet, Take 0.5 mg by mouth daily., Disp: , Rfl:    clopidogrel  (PLAVIX ) 75 MG tablet, TAKE 1 TABLET(75 MG) BY MOUTH DAILY, Disp: 90 tablet, Rfl: 0   cyanocobalamin  (VITAMIN B12) 1000 MCG/ML injection, Vit B12 1000 mcg IM once a week for 3 weeks then once a month for 4 months. During and after loading dose with injection treatment patient should also take 1000 mcg by mouth once a day., Disp: , Rfl:    famotidine  (PEPCID ) 20 MG tablet, TAKE 1 TABLET(20 MG) BY MOUTH TWICE DAILY, Disp: 180 tablet, Rfl: 0   fluticasone  (FLONASE ) 50 MCG/ACT nasal spray, Place 2 sprays into both nostrils daily., Disp: 16 g, Rfl: 6   gabapentin  (NEURONTIN ) 300 MG capsule, Take 1 capsule (300 mg total) by mouth 2 (two) times daily., Disp: 60 capsule, Rfl: 2   hydrALAZINE  (APRESOLINE ) 25 MG tablet, Take 1 tablet (25 mg total) by mouth 3 (three) times daily., Disp: 270 tablet, Rfl: 1   [START ON 09/29/2024] HYDROcodone -acetaminophen  (NORCO/VICODIN) 5-325 MG tablet, Take 1 tablet by mouth every 8 (eight) hours as needed for severe pain (pain score 7-10). Must last 30 days, Disp: 90 tablet, Rfl: 0   hydroxychloroquine (PLAQUENIL) 200 MG tablet, Take 200 mg by mouth 2 (two) times daily., Disp: , Rfl:    hydrOXYzine  (ATARAX ) 25 MG tablet, Take 25-50 mg by mouth at bedtime as needed., Disp: , Rfl:    JOURNAVX  50 MG TABS, Take 50 mg by mouth every 12 (twelve) hours., Disp: 60 tablet, Rfl: 0   loratadine  (CLARITIN ) 10 MG tablet, Take 10 mg by mouth  daily., Disp: , Rfl:    losartan  (COZAAR ) 25 MG tablet, Take 25 mg by mouth daily., Disp: , Rfl:    meclizine  (ANTIVERT ) 12.5 MG tablet, Take 1 tablet (12.5 mg total) by mouth 3 (three) times daily as needed for dizziness., Disp: 30 tablet, Rfl: 0   metoprolol  succinate (TOPROL -XL) 25 MG 24 hr tablet, Take 1 tablet (25 mg total) by mouth daily., Disp: 90 tablet, Rfl: 3   midodrine  (PROAMATINE ) 10 MG tablet, Take 10 mg by mouth 3 (three) times daily., Disp: , Rfl:  montelukast  (SINGULAIR ) 10 MG tablet, Take 1 tablet (10 mg total) by mouth at bedtime., Disp: 90 tablet, Rfl: 1   Multiple Vitamins-Minerals (MULTIVITAMIN WITH MINERALS) tablet, Take 1 tablet by mouth daily., Disp: , Rfl:    mycophenolate  (CELLCEPT ) 500 MG tablet, Take 2 tablets (1,000 mg total) by mouth 2 (two) times daily. HOLD until neurosurgery is ok to restart this (waiting for incision to heal), Disp: , Rfl:    oxybutynin  (DITROPAN  XL) 15 MG 24 hr tablet, Take 15 mg by mouth daily., Disp: , Rfl:    pantoprazole  (PROTONIX ) 20 MG tablet, Take 1 tablet (20 mg total) by mouth 2 (two) times daily., Disp: 180 tablet, Rfl: 3   rosuvastatin  (CRESTOR ) 20 MG tablet, Take 1 tablet (20 mg total) by mouth daily., Disp: 90 tablet, Rfl: 1   sertraline  (ZOLOFT ) 100 MG tablet, Take 1.5 tablets (150 mg total) by mouth daily., Disp: 135 tablet, Rfl: 1   solifenacin  (VESICARE ) 5 MG tablet, Take 5 mg by mouth daily., Disp: , Rfl:    Syringe/Needle, Disp, (SYRINGE 3CC/25GX1) 25G X 1 3 ML MISC, To be used with Vit B12 1000 mcg IM once a week for 4 weeks then once a month for 4 months. During and after loading dose with injection treatment patient should also take 1000 mcg by mouth once a day., Disp: , Rfl:    tadalafil  (CIALIS ) 20 MG tablet, Take 1 tablet (20 mg total) by mouth daily as needed for erectile dysfunction. Avoid taking it if low blood pressure, Disp: 30 tablet, Rfl: 7   tamsulosin  (FLOMAX ) 0.4 MG CAPS capsule, Take 1 capsule (0.4 mg total)  by mouth daily., Disp: 30 capsule, Rfl: 11  Allergies  Allergen Reactions   Enalapril Other (See Comments)    Unknown reaction   Vicodin [Hydrocodone -Acetaminophen ] Hives and Rash    Severe headaches (also) NAME BRAND ONLY PER PT CAN TAKE GENERIC     ROS  Constitutional: Negative for fever or weight change.  Respiratory: Negative for cough and shortness of breath.   Cardiovascular: Negative for chest pain or palpitations.  Gastrointestinal: Negative for abdominal pain. Endorses dark-colored stools for the past several weeks. Musculoskeletal: Negative for gait problem or joint swelling.  Skin: Negative for rash.  Neurological: Negative for dizziness or headache.  No other specific complaints in a complete review of systems (except as listed in HPI above).   Objective  Vitals:   09/19/24 1326  BP: (!) 142/78  Pulse: 73  Resp: 16  SpO2: 98%  Weight: 223 lb (101.2 kg)  Height: 6' 2 (1.88 m)    Body mass index is 28.63 kg/m.  Physical Exam Constitutional:      Appearance: Normal appearance.  HENT:     Head: Normocephalic and atraumatic.     Right Ear: Tympanic membrane, ear canal and external ear normal.     Left Ear: Tympanic membrane, ear canal and external ear normal.     Nose: Nose normal.     Mouth/Throat:     Mouth: Mucous membranes are moist.     Pharynx: Oropharynx is clear.  Eyes:     Extraocular Movements: Extraocular movements intact.     Conjunctiva/sclera: Conjunctivae normal.     Pupils: Pupils are equal, round, and reactive to light.  Cardiovascular:     Rate and Rhythm: Normal rate and regular rhythm.     Heart sounds: Normal heart sounds.  Abdominal:     General: Bowel sounds are normal.  Palpations: Abdomen is soft.  Musculoskeletal:        General: Normal range of motion.     Cervical back: Normal range of motion and neck supple.  Skin:    General: Skin is warm and dry.  Neurological:     General: No focal deficit present.     Mental  Status: He is alert and oriented to person, place, and time. Mental status is at baseline.  Psychiatric:        Mood and Affect: Mood normal.        Behavior: Behavior normal.        Thought Content: Thought content normal.        Judgment: Judgment normal.      Recent Results (from the past 2160 hours)  Compliance Drug Analysis, Ur     Status: None   Collection Time: 07/19/24  2:07 PM  Result Value Ref Range   Summary FINAL     Comment: ==================================================================== Compliance Drug Analysis, Ur ==================================================================== Test                             Result       Flag       Units  Drug Present and Declared for Prescription Verification   Metoprolol                      PRESENT      EXPECTED  Drug Present not Declared for Prescription Verification   Tramadol                        166          UNEXPECTED ng/mg creat   O-Desmethyltramadol            1291         UNEXPECTED ng/mg creat    Source of tramadol  is a prescription medication. O-desmethyltramadol    is an expected metabolite of tramadol .  Drug Absent but Declared for Prescription Verification   Clonazepam                      Not Detected UNEXPECTED ng/mg creat   Sertraline                      Not Detected UNEXPECTED   Aripiprazole                    Not Detected UNEXPECTED ==================================================================== Test                       Result    Flag   Units      Ref Range   Creatinine              94               mg/dL      >=79 ==================================================================== Declared Medications:  The flagging and interpretation on this report are based on the  following declared medications.  Unexpected results may arise from  inaccuracies in the declared medications.   **Note: The testing scope of this panel includes these medications:   Aripiprazole  (Abilify )  Clonazepam   (Klonopin )  Metoprolol   Sertraline  (Zoloft )   **Note: The testing scope of this panel does not include the  following reported medications:   Apixaban  (Eliquis )  Azelastine (Astelin)  Belimumab (Benlysta)  Carbidopa (Sinemet)  Clopidogrel  (Plavix )  Levodopa (  Sinemet)  Midodrine  (Proamatine )  Montelukast  (Singulair )  Multivitamin  Mycophenolate  mofetil (Cellcept )  Oxybutynin  (Ditropan )  Pantoprazole  (Protonix )  Rosuvastatin  (Crestor )  Solifenacin  (Vesicare )  Vitamin B12  Vitamin D3 ===== =============================================================== For clinical consultation, please call [phone]. ====================================================================   HgB A1c     Status: Abnormal   Collection Time: 07/28/24  3:31 PM  Result Value Ref Range   Hgb A1c MFr Bld 5.7 (H) <5.7 %    Comment: For someone without known diabetes, a hemoglobin  A1c value between 5.7% and 6.4% is consistent with prediabetes and should be confirmed with a  follow-up test. . For someone with known diabetes, a value <7% indicates that their diabetes is well controlled. A1c targets should be individualized based on duration of diabetes, age, comorbid conditions, and other considerations. . This assay result is consistent with an increased risk of diabetes. . Currently, no consensus exists regarding use of hemoglobin A1c for diagnosis of diabetes for children. .    Mean Plasma Glucose 117 mg/dL   eAG (mmol/L) 6.5 mmol/L  Comprehensive Metabolic Panel (CMET)     Status: Abnormal   Collection Time: 07/28/24  3:31 PM  Result Value Ref Range   Glucose, Bld 92 65 - 99 mg/dL    Comment: .            Fasting reference interval .    BUN 24 7 - 25 mg/dL   Creat 8.40 (H) 9.29 - 1.35 mg/dL   eGFR 49 (L) > OR = 60 mL/min/1.69m2   BUN/Creatinine Ratio 15 6 - 22 (calc)   Sodium 137 135 - 146 mmol/L   Potassium 4.1 3.5 - 5.3 mmol/L   Chloride 106 98 - 110 mmol/L   CO2 23 20 - 32  mmol/L   Calcium  9.2 8.6 - 10.3 mg/dL   Total Protein 7.0 6.1 - 8.1 g/dL   Albumin  4.1 3.6 - 5.1 g/dL   Globulin 2.9 1.9 - 3.7 g/dL (calc)   AG Ratio 1.4 1.0 - 2.5 (calc)   Total Bilirubin 0.6 0.2 - 1.2 mg/dL   Alkaline phosphatase (APISO) 98 35 - 144 U/L   AST 11 10 - 35 U/L   ALT 9 9 - 46 U/L  CBC with Differential/Platelet     Status: Abnormal   Collection Time: 07/28/24  3:31 PM  Result Value Ref Range   WBC 4.7 3.8 - 10.8 Thousand/uL   RBC 4.89 4.20 - 5.80 Million/uL   Hemoglobin 13.3 13.2 - 17.1 g/dL   HCT 56.8 61.4 - 49.9 %   MCV 88.1 80.0 - 100.0 fL   MCH 27.2 27.0 - 33.0 pg   MCHC 30.9 (L) 32.0 - 36.0 g/dL    Comment: For adults, a slight decrease in the calculated MCHC value (in the range of 30 to 32 g/dL) is most likely not clinically significant; however, it should be interpreted with caution in correlation with other red cell parameters and the patient's clinical condition.    RDW 14.4 11.0 - 15.0 %   Platelets 189 140 - 400 Thousand/uL   MPV 11.5 7.5 - 12.5 fL   Neutro Abs 2,829 1,500 - 7,800 cells/uL   Absolute Lymphocytes 1,180 850 - 3,900 cells/uL   Absolute Monocytes 564 200 - 950 cells/uL   Eosinophils Absolute 108 15 - 500 cells/uL   Basophils Absolute 19 0 - 200 cells/uL   Neutrophils Relative % 60.2 %   Total Lymphocyte 25.1 %   Monocytes Relative 12.0 %   Eosinophils  Relative 2.3 %   Basophils Relative 0.4 %  Vitamin B12     Status: None   Collection Time: 07/28/24  3:31 PM  Result Value Ref Range   Vitamin B-12 796 200 - 1,100 pg/mL  Iron, TIBC and Ferritin Panel     Status: Abnormal   Collection Time: 07/28/24  3:31 PM  Result Value Ref Range   Iron 51 50 - 180 mcg/dL   TIBC 654 749 - 574 mcg/dL (calc)   %SAT 15 (L) 20 - 48 % (calc)   Ferritin 23 (L) 24 - 380 ng/mL     Fall Risk:    09/19/2024    1:26 PM 09/05/2024    8:12 AM 08/24/2024    1:49 PM 08/17/2024    9:15 AM 08/09/2024    8:55 AM  Fall Risk   Falls in the past year? 1 1 1  0  0  Number falls in past yr: 1 1 0    Injury with Fall? 0 0 1    Comment   fell in 10/2024 fell in kitchen felt dizzy and fell trying to sit in chair head laceration/ fractured back    Risk for fall due to : History of fall(s)  History of fall(s);Impaired mobility;Impaired balance/gait    Follow up Falls prevention discussed  Falls evaluation completed;Education provided;Falls prevention discussed;Follow up appointment    Comment   Patient is continuing to go to OPPT      Functional Status Survey: Is the patient deaf or have difficulty hearing?: No Does the patient have difficulty seeing, even when wearing glasses/contacts?: No Does the patient have difficulty concentrating, remembering, or making decisions?: No Does the patient have difficulty walking or climbing stairs?: No Does the patient have difficulty dressing or bathing?: No Does the patient have difficulty doing errands alone such as visiting a doctor's office or shopping?: No    Assessment & Plan  Problem List Items Addressed This Visit   None Visit Diagnoses       Well adult exam    -  Primary   Will order labs at next visit. Recently got labs collected.     Immunization due       Relevant Orders   Flu vaccine HIGH DOSE PF(Fluzone Trivalent) (Completed)     Dark stools       Endorses dark colored stool for the past several weeks. Sent patient home with stool cards. colon cancer screening up to date.       -Prostate cancer screening and PSA options (with potential risks and benefits of testing vs not testing) were discussed along with recent recs/guidelines. -USPSTF grade A and B recommendations reviewed with patient; age-appropriate recommendations, preventive care, screening tests, etc discussed and encouraged; healthy living encouraged; see AVS for patient education given to patient -Discussed importance of 150 minutes of physical activity weekly, eat two servings of fish weekly, eat one serving of tree nuts (  cashews, pistachios, pecans, almonds.SABRA) every other day, eat 6 servings of fruit/vegetables daily and drink plenty of water  and avoid sweet beverages.  -Reviewed Health Maintenance: Yes  I have reviewed this encounter including the documentation in this note and/or discussed this patient with the provider, Alexa Everhart SNP, I am certifying that I agree with the content of this note as supervising/preceptor nurse practitioner.  Mliss Spray, FNP-C [facility] 09/19/2024, 2:37 PM

## 2024-09-20 ENCOUNTER — Encounter: Admitting: Family Medicine

## 2024-09-20 ENCOUNTER — Ambulatory Visit

## 2024-09-20 ENCOUNTER — Encounter: Admitting: Nurse Practitioner

## 2024-09-21 ENCOUNTER — Other Ambulatory Visit: Payer: Self-pay

## 2024-09-21 ENCOUNTER — Ambulatory Visit: Attending: Neurology

## 2024-09-21 VITALS — BP 141/86 | HR 66

## 2024-09-21 DIAGNOSIS — I890 Lymphedema, not elsewhere classified: Secondary | ICD-10-CM | POA: Diagnosis not present

## 2024-09-21 DIAGNOSIS — M5416 Radiculopathy, lumbar region: Secondary | ICD-10-CM | POA: Insufficient documentation

## 2024-09-21 DIAGNOSIS — R262 Difficulty in walking, not elsewhere classified: Secondary | ICD-10-CM | POA: Diagnosis not present

## 2024-09-21 DIAGNOSIS — M542 Cervicalgia: Secondary | ICD-10-CM | POA: Insufficient documentation

## 2024-09-21 DIAGNOSIS — R2681 Unsteadiness on feet: Secondary | ICD-10-CM | POA: Insufficient documentation

## 2024-09-21 DIAGNOSIS — M5412 Radiculopathy, cervical region: Secondary | ICD-10-CM | POA: Diagnosis not present

## 2024-09-21 DIAGNOSIS — I951 Orthostatic hypotension: Secondary | ICD-10-CM | POA: Insufficient documentation

## 2024-09-21 DIAGNOSIS — R42 Dizziness and giddiness: Secondary | ICD-10-CM | POA: Diagnosis not present

## 2024-09-21 DIAGNOSIS — M5459 Other low back pain: Secondary | ICD-10-CM | POA: Diagnosis not present

## 2024-09-21 NOTE — Therapy (Signed)
 OUTPATIENT PHYSICAL THERAPY TREATMENT Patient Name: Jonathon Snow MRN: [id number] DOB:09/01/1961, 63 y.o., male Today's Date: 09/21/2024  PCP: [name], PA-C REFERRING PROVIDER: Maree Jannett POUR, MD  END OF SESSION:  PT End of Session - 09/21/24 0802     Visit Number 5    Number of Visits 16    Date for Recertification  11/02/24    Authorization Type [place] Medicaid    Authorization Time Period 09/07/24-11/02/24    Progress Note Due on Visit 10    PT Start Time 0800    PT Stop Time 0840    PT Time Calculation (min) 40 min    Equipment Utilized During Treatment Gait belt    Activity Tolerance Patient tolerated treatment well;No increased pain          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   Overview:  superior sagittal sinus, left transverse sinus and cortical veins    Closed left hip fracture, initial encounter (HCC) 02/18/2023   COVID-19 virus infection 12/2020   Depression    DVT (deep venous thrombosis) (HCC)    Dyspnea    GERD (gastroesophageal reflux disease)    Head injury 11/05/2023   Heart attack (HCC) 11/05/2023   Heel spur, left 02/16/2019   Heel spur, right 02/16/2019   Hematoma of groin 11/08/2023   Herpes zoster infection of lumbar region 02/20/2020   History of DVT (deep vein thrombosis) 03/22/2020   History of kidney stones    HTN (hypertension)    Hyperlipidemia    Hypertension    Long term current use of systemic steroids 11/08/2020   Lupus    Lymphedema 10/07/2018   Morbid obesity (HCC)    Opiate abuse, episodic (HCC) 02/26/2018   Osteoporosis    Pain and swelling of right lower extremity 02/03/2023   Pneumonia    PONV (postoperative nausea and vomiting)    Postphlebitic syndrome with ulcer, 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    Presence of IVC filter 03/22/2020   Removed   Pulmonary embolism (HCC)    Renal disorder    Stage III   Severe episode of recurrent major depressive disorder, 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   Shock circulatory (HCC) 11/06/2023   STEMI (ST elevation myocardial infarction) (HCC) 11/05/2023   Past Surgical History:  Procedure Laterality Date   ANKLE SURGERY Right    BACK SURGERY     BRONCHIAL WASHINGS N/A 11/01/2021   Procedure: BRONCHIAL WASHINGS;  Surgeon: Parris Manna, MD;  Location: [facility] ORS;  Service: Thoracic;  Laterality: N/A;   COLONOSCOPY WITH PROPOFOL  N/A 05/28/2020   Procedure: COLONOSCOPY WITH PROPOFOL ;  Surgeon: Therisa Bi, MD;  Location: [facility] ENDOSCOPY;  Service: Endoscopy;  Laterality: N/A;   COLONOSCOPY WITH PROPOFOL  N/A 07/01/2023   Procedure: COLONOSCOPY WITH PROPOFOL ;  Surgeon: Therisa Bi, MD;  Location: [facility] ENDOSCOPY;  Service: Gastroenterology;  Laterality: N/A;   CORONARY ULTRASOUND/IVUS N/A 11/05/2023   Procedure: Coronary Ultrasound/IVUS;  Surgeon: Mady Bruckner, MD;  Location: [facility] INVASIVE CV LAB;  Service: Cardiovascular;  Laterality: N/A;   CORONARY/GRAFT ACUTE MI REVASCULARIZATION N/A 11/05/2023   Procedure: Coronary/Graft Acute MI Revascularization;  Surgeon: Mady Bruckner, MD;  Location: [facility] INVASIVE CV LAB;  Service: Cardiovascular;  Laterality: N/A;   CYST EXCISION  92 or 93    Liver cyst removal [facility]  FLEXIBLE BRONCHOSCOPY N/A 11/01/2021   Procedure: FLEXIBLE BRONCHOSCOPY;  Surgeon: Parris Manna, MD;  Location: [facility] ORS;  Service: Thoracic;  Laterality: N/A;   HIP PINNING,CANNULATED Left 02/19/2023   Procedure: PERCUTANEOUS FIXATION OF FEMORAL NECK;  Surgeon: Tobie Priest, MD;  Location: [facility] ORS;  Service: Orthopedics;  Laterality: Left;   I & D EXTREMITY Right 04/29/2017   Procedure: IRRIGATION AND DEBRIDEMENT EXTREMITY;  Surgeon: Shelva Dunnings, MD;  Location: [facility] ORS;  Service: General;  Laterality: Right;    IRRIGATION AND DEBRIDEMENT ABSCESS Left 04/29/2017   Procedure: IRRIGATION AND DEBRIDEMENT Scrotal ABSCESS;  Surgeon: Shelva Dunnings, MD;  Location: [facility] ORS;  Service: General;  Laterality: Left;   LEFT HEART CATH AND CORONARY ANGIOGRAPHY N/A 11/05/2023   Procedure: LEFT HEART CATH AND CORONARY ANGIOGRAPHY;  Surgeon: Mady Bruckner, MD;  Location: [facility] INVASIVE CV LAB;  Service: Cardiovascular;  Laterality: N/A;   POLYPECTOMY  07/01/2023   Procedure: POLYPECTOMY;  Surgeon: Therisa Bi, MD;  Location: [facility] ENDOSCOPY;  Service: Gastroenterology;;   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) 01/20/2024   Anxiety 12/14/2023   Overweight (BMI 25.0-29.9) 11/23/2023   Chronic heart failure with preserved ejection fraction (HFpEF) (HCC) 11/23/2023   BPH (benign prostatic hyperplasia) 11/23/2023   Unstable angina (HCC) 11/10/2023   Closed T10 spinal fracture (HCC) 11/06/2023   Closed tricolumnar fracture of thoracic vertebra (HCC) 11/06/2023   Thoracic spine instability 11/06/2023   Chronic bilateral low back pain with left-sided sciatica 07/07/2023   Neuropathy 07/07/2023   Hx of colonic polyps 07/01/2023   Adenomatous polyp of colon 07/01/2023   Erectile disorder 01/22/2023   Coronary artery disease involving native coronary artery of native heart without angina pectoris 09/09/2022   Varicose 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  01/03/2022   Immunocompromised state due to drug therapy 09/18/2021   Prediabetes 11/08/2020   Stage 3a chronic kidney disease (HCC) 11/08/2020   Seasonal allergies 11/08/2020   Rhinosinusitis 11/08/2020   Anticoagulant disorder 11/07/2020   Senile purpura 11/07/2020   MDD (major depressive disorder), recurrent episode, mild 09/12/2019   Other spondylosis with radiculopathy, lumbar region 02/16/2019   Osteoporosis 10/12/2018   Chronic venous insufficiency 10/07/2018   Lymphedema 10/07/2018   SLE glomerulonephritis syndrome, 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HCC) 03/03/2018   Syncope and collapse 12/29/2017   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) 10/29/2016   Hyperlipidemia LDL goal <70 01/15/2016   Uncontrolled hypertension 01/15/2016   Obesity (BMI 30.0-34.9) 01/15/2016   Long term current use of anticoagulant 10/04/2015   Systemic lupus erythematosus (HCC) 05/30/2015   COPD, moderate (HCC) 06/27/2014   Shortness of breath 06/27/2014   History of pulmonary embolism 12/11/2013   Nodule of right lung 12/11/2013   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   Cystic disease of liver 08/21/2013   ONSET DATE: November 2024   REFERRING DIAG: imbalance  THERAPY DIAG:  Syncope due to orthostatic hypotension  Unsteadiness on feet  Rationale for Evaluation and Treatment: Rehabilitation  SUBJECTIVE:                                                                                                                                                                                            SUBJECTIVE STATEMENT: No updates today. HEP as planned. Pain is somewhat better. Pt had a very orthostatic and hypotensive day yesterday, but is improved today.   PERTINENT HISTORY:   62yoM referred to OPPT for imbalance. Pt just finished OPPT for post surgical neck pain on 08/30/24 at [facility] so he could begin his balance therapy here. Pt has had issues with  chronic orthostatic hypotension repeated syncope, fluctuating intolerance to upright. Pt has become quite good at managing his safety at home with symptoms fluctuations. PT checks BP daily, has PRN meds for excessively low or high pressures.   PAIN:  Are you having pain? 5/10     PRECAUTIONS: Fall  WEIGHT BEARING RESTRICTIONS: No  FALLS: Has patient fallen in last 6 months? Yes 1x in March 2025  LIVING ENVIRONMENT: Lives with: Alone  Lives in: 1 level  Stairs: threshold only Has following equipment at home: RW, canes, walking sticks.   PLOF: 2 years ago was able to go hiking for pleasure ab lib; used to get in and out kayak and do multi day treks overnight with kayak.   PATIENT GOALS: improve tolerance to upright mobility, stop falling as much   OBJECTIVE:  Note: Objective measures were completed at evaluation unless otherwise noted.   FUNCTIONAL TESTS:  TUG: 34sec (armless chair)  5xSTS: 54.9sec with BUE heavy use arm chair  : 14.63 Normal stance eyes closed: 9/22: 5-10sec for 3 bouts  Narrow stance eyes closed: 9/22: 17-18 sec for 3 bouts   PATIENT SURVEYS:  ABC Scale: 47.5%                                                                                                                              TREATMENT DATE 09/21/24:  -433ft AMB, overground, no device, 9m53s -434ft AMB, overground, no device,  55m58s (more pain limited)  -airex pad stance x2 minutes, alternating eyes closed x3secH  -aitrex pad LUE ball toss/catch with 1kg ball x10, then 2kg ball x10 -airex pad LUE overhead rebounding off wall 1kg x15 -forward/backward stepping stones: floor to 2 step to foam pad to palstic step, then backwards (8x total) then 4 x lateral stepping *close minGuard assist  09/19/24 -AA/ROM, cardioaerobic fitness on NUSTEP: seat 13, arms 13x8 minutes, level 2  avoided use of RUE due to radicular history.    *AMB overground to wellzone -Wellzone leg press: seat 12, plate 3,  k84 -standing at leg press, double heel raise x15 -standing at leg press, alternate marching x15 -Wellzone leg press: seat 12, plate 3, k84 -standing at leg press, double heel raise x15 -standing at leg press, alternate marching x15 -Wellzone leg press: seat 12, plate 3, k84 *AMB back to rehab gym  -sidestepping in // bars 5x bilat, hands-free when able *sit break  -alternate forward backward AMB in // bars 5x each, single UE support on bars  *sit break  -alternating 180 degrees turns in // bars, hands free when able x20   PATIENT EDUCATION: Education details: Management recommendations for orthostatic hypotension  Person educated: Programmer, applications: Discussion, handout Education comprehension: Fair   HOME EXERCISE PROGRAM: Access Code: [email or web address] URL: [email or web address] Date: 09/12/2024 Prepared by: Massie Dollar  Exercises - sit to stand, arms crossed, 28-30 inch surface   - 3 x daily - 1 sets - 8 reps - Standing Near Stance in Corner with Eyes Closed  - 3 x daily - 1 sets - 5 reps - 30 hold - Semi-Tandem Balance at Counter Top Eyes Open  - 1 x daily - 7 x weekly - 3 sets - 4 reps - 15 hold - Standing March with Counter Support  - 1 x daily - 7 x weekly - 3 sets - 10 reps - Seated Long Arc Quad  - 1 x daily - 7 x weekly - 3 sets - 10 reps - Backward Walking with Counter Support  - 1 x daily - 7 x weekly - 3 sets - 5 reps  GOALS: Goals reviewed with patient? No  SHORT TERM GOALS: Target date: 10/07/24  Pt to report no falls in most recent 4 weeks to indicate good safety practices and symptoms management in the home.  Baseline: reports orthostatic s/s roughly 2x per week.  Goal status: INITIAL  2.  Pt will demonstrate improvement in 5xSTS performance by >3 seconds.  Baseline: 54.9sec with BUE heavy use arm chair  Goal status: INITIAL  3.  Pt to demonstrate improvement in by 20% and backwards by 25%  Baseline: 9/22: forward: 14.63 sec  (0.60m/s). Backward: 49.04 sec 0.54m/s  Goal status: INITIAL  LONG TERM GOALS: Target date: 11/07/24  Pt to demonstrate tolerance of >1049ft sustained AMB c LRAD without presyncope, without LOB, and without increase in back pain >2/10 points NPRS.  Baseline: tolerated 372ft with mild-moderate increase in L hip and low back pain.  Goal status: INITIAL  2.  5xSTS performance, standard chair, hands free <20sec; hands free from 19.5 surface height in <12sec.   Baseline:  54.9sec with BUE heavy use arm chair  Goal status: INITIAL  3.  Eye closed in narrow stance x60sec (5x without LOB and without need for hand support).  Baseline: 4-10 sec  Goal status: INITIAL  ASSESSMENT:  CLINICAL IMPRESSION: Began overground AMB training- tolerance is very much limited by multiple chronic  factors. Pt does well with airex pad training today with perturbations. Patient will benefit from skilled physical therapy intervention to reduce deficits and impairments identified in evaluation, in order to reduce pain, improve quality of life, and maximize activity tolerance for ADL, IADL, and leisure/fitness. Physical therapy will help pt achieve long and short term goals of care.    OBJECTIVE IMPAIRMENTS: Decreased knowledge of condition, decreased use of DME, decreased mobility, difficulty walking, decreased strength, decreased ROM. ACTIVITY LIMITATIONS: Lifting, standing, walking, squatting, transfers, locomotion level PARTICIPATION LIMITATIONS: Cleaning, laundry, interpersonal relationships, driving, yardwork, community activity.  PERSONAL FACTORS: Age, behavior pattern, education, past/current experiences, transportation, profession  are also affecting patient's functional outcome.  REHAB POTENTIAL: Good CLINICAL DECISION MAKING: Medium  EVALUATION COMPLEXITY: Moderate    PLAN:  PT FREQUENCY: 1-2x/week PT DURATION: 8 weeks PLANNED INTERVENTIONS: 97110-Therapeutic exercises, 97530- Therapeutic activity,  97112- Neuromuscular re-education, 97535- Self Care, 02859- Manual therapy, [phone]- Gait training, [phone]- Electrical stimulation (unattended), [phone]- Electrical stimulation (manual), Patient/Family education, Balance training, Stair training, Cryotherapy, and Moist heat  PLAN FOR NEXT SESSION:  -conitnued leg press, Nustep for endurance building or overground AMB as tolerated, and balance training.    8:03 AM, 09/21/24 Peggye JAYSON Linear, PT, DPT Physical Therapist - [facility]  Outpatient Physical Therapy- Main Campus [phone]

## 2024-09-21 NOTE — Patient Instructions (Signed)
 Visit Information  Mr. Mitchum was given information about Medicaid Managed Care team care coordination services as a part of their Healthy [facility] Medicaid benefit. IZAIAS KRUPKA   If you would like to schedule transportation through your Healthy [facility] plan, please call the following number at least 2 days in advance of your appointment: [phone]  For information about your ride after you set it up, call Ride Assist at [phone]. Use this number to activate a Will Call pickup, or if your transportation is late for a scheduled pickup. Use this number, too, if you need to make a change or cancel a previously scheduled reservation.  If you need transportation services right away, call [phone]. The after-hours call center is staffed 24 hours to handle ride assistance and urgent reservation requests (including discharges) 365 days a year. Urgent trips include sick visits, hospital discharge requests and life-sustaining treatment.  Call the [facility] Line at [phone], at any time, 24 hours a day, 7 days a week. If you are in danger or need immediate medical attention call 911.   Please see education materials related to COPD, CHF and Anxiety  provided by MyChart link.  Patient verbalizes understanding of instructions and care plan provided today and agrees to view in MyChart. Active MyChart status and patient understanding of how to access instructions and care plan via MyChart confirmed with patient.     Licensed Clinical Social Worker will contact patient  Telephone follow up appointment with Managed Medicaid care management team member scheduled for:10-19-2024 at 1:00 PM   [name] RN, BSN  [place] [facility]-Population Health RN Case Manager   Direct [phone]   Following is a copy of your plan of care:  There are no care plans that you recently modified to display for this patient.

## 2024-09-23 ENCOUNTER — Ambulatory Visit: Admitting: Physical Therapy

## 2024-09-23 NOTE — Progress Notes (Signed)
 Otolaryngology New Consult Visit  HPI: Jonathon Snow is a 63 y.o. male is seen in consultation at the request of Glendia Deward Dolly  for evaluation of left aural pain  History of Present Illness Jonathon Snow is a 63 year old male with head trauma and tinnitus who presents with left ear pain and pressure. He was referred by his doctor in [place] for further evaluation of his symptoms.  He has been experiencing a constant high-pitched ringing in his left ear since November of last year, following a head injury sustained during a fall after a heart attack. The tinnitus began before he noticed any hearing loss.  He describes intermittent pressure and pain in the left ear, which varies in intensity. At times, the pain is mild and barely noticeable, but it can become severe, feeling like 'somebody stabbed something in my ear.' The pain is often worse at night when lying on his side. The pressure and pain are not constant and fluctuate in severity.  He has noticed hearing loss in the left ear over the past few months, initially observed when he had to increase the volume on his television. The hearing loss seemed to occur suddenly rather than gradually.  He was prescribed a oral prednisone  taper by his home ENT, which did not improve his hearing, however, did improve his ear pain while he was taking it..  No ear drainage or pus is noted. His significant medical history includes kidney disease, lupus, clotting issues, and previous blood clots, including a venous thrombosis and pulmonary embolism. He is currently on Eliquis  and Plavix  for anticoagulation.  No recent increase in stress or known issues with teeth grinding or clenching at night. He has seen a dentist recently who did not identify any dental issues.  He does pop his jaw frequently.  PMH: Past Medical History[1]   PSH: Past Surgical History[2]   Meds: Medications Ordered Prior to Encounter[3]  Allergies: Allergies[4]    FH: Family History[5]  SH: Short Social History[6]   ROS: Review of systems was reviewed on attached notes/patient intake forms.   Exam: Temp 36.5 C (97.7 F)   Ht 188 cm (6' 2)   Wt 99.8 kg (220 lb)   BMI 28.25 kg/m   General: well appearing, stated age, no distress  Communicates age appropriate, responds to speech well Head - atraumatic, normocephalic  Face - no surface abnormalities, no tenderness over sinuses. Significant tenderness to palpation over left TMJ Facial Strength - AD 1/6, AS 1/6 Eyes - no conjunctivitis, no scleritis/iritis, EOM full  Ears - External ear- normal, no lesions, no malformations  Otoscopy - Normal EAC, TM neutral bilateral Tuning fork test - NA Nose - external nose without deformity, anterior rhinoscopy - turbinates, mucosa normal  Oral Cavity - lips without lesions, dentition nl, alveolus normal, hard palate normal, mucosa floor of mouth, buccal mucosa, retromolar trigone mucosa all normal  Oropharynx - no mucosal lesions, tonsil, soft palate, posterior pharynx  Salivary Glands - no mass or assymmetry, no tenderness  Neck - no lymphadenopathy, no thyromegaly  Lymphatic - no neck lymphadenopathy  Cardiovascular - heart regular rate and rhythym, no murmurs appreciated Pulmonary - lungs clear to auscultation bilaterally Neurologic - cranial nerves 2-12 intact, patient oriented x3, appropriate mood  Audiogram: Left normal to moderate SNHL, right normal hearing. 100% bilateral  Tympanogram: Type A bilateral Imaging: MRI Brain 08/19/24 with normal bilateral IAC/CPA, no retrocochlear pathology.   Assessment/Plan: Jonathon Snow is a 63 y.o. male  who presents with left tinnitus/SNHL, as well as left ear pain.  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  No significant improvement with oral steroid taper.  We discussed masking techniques for his tinnitus. His left  aural fullness is secondary to temporomandibular joint disorder, with significant reproducible TMJ pain on palpation.  Discussed treatment options including warm compresses the jaw, soft diet, mouthguard.  Given his CKD, recommended he discuss with his nephrologist prior to taking any NSAIDs.       [1] No past medical history on file. [2] No past surgical history on file. [3] Current Outpatient Medications on File Prior to Visit  Medication Sig Dispense Refill  . aripiprazole  (ABILIFY ) 2 MG tablet Take 2 tablets (4 mg total) by mouth daily.    SABRA azelastine (ASTELIN) 137 mcg (0.1 %) nasal spray daily as needed.    . clopidogrel  (PLAVIX ) 75 mg tablet Take 1 tablet (75 mg total) by mouth daily.    . ELIQUIS  5 mg Tab Take 1 tablet (5 mg total) by mouth two (2) times a day.    . famotidine  (PEPCID ) 20 MG tablet Take 1 tablet (20 mg total) by mouth two (2) times a day.    . fluticasone  propionate (FLONASE ) 50 mcg/actuation nasal spray daily as needed.    . gabapentin  (NEURONTIN ) 300 MG capsule Take 1 capsule (300 mg total) by mouth Three (3) times a day.    . hydrALAZINE  (APRESOLINE ) 25 MG tablet Take 1 tablet (25 mg total) by mouth two (2) times a day.    . hydroxychloroquine (PLAQUENIL) 200 mg tablet Take 1 tablet (200 mg total) by mouth two (2) times a day.    . losartan  (COZAAR ) 50 MG tablet Take 1 tablet (50 mg total) by mouth daily.    . metoPROLOL  succinate (TOPROL -XL) 25 MG 24 hr tablet Take 1 tablet (25 mg total) by mouth daily. TAKE 1 TABLET (25 MG TOTAL) BY MOUTH DAILY.    . midodrine  (PROAMATINE ) 10 MG tablet Take 1 tablet (10 mg total) by mouth Three (3) times a day.    . montelukast  (SINGULAIR ) 10 mg tablet Take 1 tablet (10 mg total) by mouth nightly.    . mycophenolate  (CELLCEPT ) 500 mg tablet Take 1 tablet (500 mg total) by mouth two (2) times a day.    . oxybutynin  (DITROPAN  XL) 15 MG 24 hr tablet Take 1 tablet (15 mg total) by mouth daily.    . pantoprazole  (PROTONIX ) 20 MG  tablet Take 1 tablet (20 mg total) by mouth two (2) times a day.    . rosuvastatin  (CRESTOR ) 10 MG tablet Take 1 tablet (10 mg total) by mouth daily.    . solifenacin  (VESICARE ) 5 MG tablet Take 1 tablet (5 mg total) by mouth daily.    . tamsulosin  (FLOMAX ) 0.4 mg capsule Take 1 capsule (0.4 mg total) by mouth daily. TAKE 1 CAPSULE (0.4 MG TOTAL) BY MOUTH DAILY.    . enoxaparin  (LOVENOX ) 150 mg/mL injection Inject 0.8 mL (120 mg total) under the skin every twelve (12) hours. 4 mL 0   No current facility-administered medications on file prior to visit.  [4] Allergies Allergen Reactions  . Enalapril Other (See Comments)    Unknown reaction  . Hydrocodone  Rash  [5] History reviewed. No pertinent family history. [6] Social History Tobacco Use  . Smoking status: Never  Vaping Use  . Vaping status: Never Used  Substance Use Topics  . Alcohol use: Yes    Alcohol/week: 1.0 standard drink of alcohol    Types: 1 Cans of beer per week  . Drug use: No

## 2024-09-26 ENCOUNTER — Encounter: Payer: Self-pay | Admitting: Family Medicine

## 2024-09-26 ENCOUNTER — Ambulatory Visit

## 2024-09-26 ENCOUNTER — Ambulatory Visit: Payer: Self-pay

## 2024-09-26 ENCOUNTER — Ambulatory Visit: Admitting: Family Medicine

## 2024-09-26 VITALS — BP 116/76 | HR 76 | Resp 18 | Ht 74.0 in | Wt 218.7 lb

## 2024-09-26 DIAGNOSIS — R42 Dizziness and giddiness: Secondary | ICD-10-CM | POA: Diagnosis not present

## 2024-09-26 DIAGNOSIS — M5412 Radiculopathy, cervical region: Secondary | ICD-10-CM | POA: Diagnosis not present

## 2024-09-26 DIAGNOSIS — I890 Lymphedema, not elsewhere classified: Secondary | ICD-10-CM | POA: Diagnosis not present

## 2024-09-26 DIAGNOSIS — R262 Difficulty in walking, not elsewhere classified: Secondary | ICD-10-CM | POA: Diagnosis not present

## 2024-09-26 DIAGNOSIS — R2681 Unsteadiness on feet: Secondary | ICD-10-CM

## 2024-09-26 DIAGNOSIS — M5459 Other low back pain: Secondary | ICD-10-CM | POA: Diagnosis not present

## 2024-09-26 DIAGNOSIS — H10023 Other mucopurulent conjunctivitis, bilateral: Secondary | ICD-10-CM | POA: Diagnosis not present

## 2024-09-26 DIAGNOSIS — M5416 Radiculopathy, lumbar region: Secondary | ICD-10-CM | POA: Diagnosis not present

## 2024-09-26 DIAGNOSIS — M542 Cervicalgia: Secondary | ICD-10-CM | POA: Diagnosis not present

## 2024-09-26 DIAGNOSIS — I951 Orthostatic hypotension: Secondary | ICD-10-CM | POA: Diagnosis not present

## 2024-09-26 MED ORDER — CIPROFLOXACIN HCL 0.3 % OP SOLN: 2.0000 [drp] | OPHTHALMIC | 0 refills | Status: DC

## 2024-09-26 NOTE — Therapy (Signed)
 OUTPATIENT PHYSICAL THERAPY TREATMENT Patient Name: LOFTON LEON MRN: [id number] DOB:1961-08-25, 63 y.o., male Today's Date: 09/26/2024  PCP: [name], PA-C REFERRING PROVIDER: Maree Jannett POUR, MD  END OF SESSION:  PT End of Session - 09/26/24 0805     Visit Number 6    Number of Visits 16    Date for Recertification  11/02/24    Authorization Type [place] Medicaid    Authorization Time Period 09/07/24-11/02/24    Progress Note Due on Visit 10    PT Start Time 0803    PT Stop Time 0843    PT Time Calculation (min) 40 min    Equipment Utilized During Treatment Gait belt    Activity Tolerance Patient tolerated treatment well;No increased pain    Behavior During Therapy [facility] for tasks assessed/performed          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   Overview:  superior sagittal sinus, left transverse sinus and cortical veins    Closed left hip fracture, initial encounter (HCC) 02/18/2023   COVID-19 virus infection 12/2020   Depression    DVT (deep venous thrombosis) (HCC)    Dyspnea    GERD (gastroesophageal reflux disease)    Head injury 11/05/2023   Heart attack (HCC) 11/05/2023   Heel spur, left 02/16/2019   Heel spur, right 02/16/2019   Hematoma of groin 11/08/2023   Herpes zoster infection of lumbar region 02/20/2020   History of DVT (deep vein thrombosis) 03/22/2020   History of kidney stones    HTN (hypertension)    Hyperlipidemia    Hypertension    Long term current use of systemic steroids 11/08/2020   Lupus    Lymphedema 10/07/2018   Morbid obesity (HCC)    Opiate abuse, episodic (HCC) 02/26/2018   Osteoporosis    Pain and swelling of right lower extremity 02/03/2023   Pneumonia    PONV (postoperative nausea and vomiting)     Postphlebitic syndrome with ulcer, 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   Presence of IVC filter 03/22/2020   Removed   Pulmonary embolism (HCC)    Renal disorder    Stage III   Severe episode of recurrent major depressive disorder, 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   Shock circulatory (HCC) 11/06/2023   STEMI (ST elevation myocardial infarction) (HCC) 11/05/2023   Past Surgical History:  Procedure Laterality Date   ANKLE SURGERY Right    BACK SURGERY     BRONCHIAL WASHINGS N/A 11/01/2021   Procedure: BRONCHIAL WASHINGS;  Surgeon: Parris Manna, MD;  Location: [facility] ORS;  Service: Thoracic;  Laterality: N/A;   COLONOSCOPY WITH PROPOFOL  N/A 05/28/2020   Procedure: COLONOSCOPY WITH PROPOFOL ;  Surgeon: Therisa Bi, MD;  Location: [facility] ENDOSCOPY;  Service: Endoscopy;  Laterality: N/A;   COLONOSCOPY WITH PROPOFOL  N/A 07/01/2023   Procedure: COLONOSCOPY WITH PROPOFOL ;  Surgeon: Therisa Bi, MD;  Location: [facility] ENDOSCOPY;  Service: Gastroenterology;  Laterality: N/A;   CORONARY ULTRASOUND/IVUS N/A 11/05/2023   Procedure: Coronary Ultrasound/IVUS;  Surgeon: Mady Bruckner, MD;  Location: [facility] INVASIVE CV LAB;  Service: Cardiovascular;  Laterality: N/A;   CORONARY/GRAFT ACUTE MI REVASCULARIZATION N/A 11/05/2023   Procedure: Coronary/Graft Acute MI Revascularization;  Surgeon: Mady Bruckner, MD;  Location: [facility] INVASIVE CV LAB;  Service: Cardiovascular;  Laterality: N/A;   CYST EXCISION  92 or 93    Liver cyst removal [facility]   FLEXIBLE BRONCHOSCOPY N/A 11/01/2021   Procedure: FLEXIBLE BRONCHOSCOPY;  Surgeon: Parris Manna, MD;  Location: [facility] ORS;  Service: Thoracic;  Laterality: N/A;   HIP PINNING,CANNULATED Left 02/19/2023   Procedure: PERCUTANEOUS FIXATION OF FEMORAL NECK;  Surgeon: Tobie Priest, MD;  Location: [facility] ORS;  Service: Orthopedics;  Laterality: Left;   I & D EXTREMITY Right 04/29/2017   Procedure: IRRIGATION AND DEBRIDEMENT EXTREMITY;  Surgeon: Shelva Dunnings, MD;  Location:  [facility] ORS;  Service: General;  Laterality: Right;   IRRIGATION AND DEBRIDEMENT ABSCESS Left 04/29/2017   Procedure: IRRIGATION AND DEBRIDEMENT Scrotal ABSCESS;  Surgeon: Shelva Dunnings, MD;  Location: [facility] ORS;  Service: General;  Laterality: Left;   LEFT HEART CATH AND CORONARY ANGIOGRAPHY N/A 11/05/2023   Procedure: LEFT HEART CATH AND CORONARY ANGIOGRAPHY;  Surgeon: Mady Bruckner, MD;  Location: [facility] INVASIVE CV LAB;  Service: Cardiovascular;  Laterality: N/A;   POLYPECTOMY  07/01/2023   Procedure: POLYPECTOMY;  Surgeon: Therisa Bi, MD;  Location: [facility] ENDOSCOPY;  Service: Gastroenterology;;   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) 01/20/2024   Anxiety 12/14/2023   Overweight (BMI 25.0-29.9) 11/23/2023   Chronic heart failure with preserved ejection fraction (HFpEF) (HCC) 11/23/2023   BPH (benign prostatic hyperplasia) 11/23/2023   Unstable angina (HCC) 11/10/2023   Closed T10 spinal fracture (HCC) 11/06/2023   Closed tricolumnar fracture of thoracic vertebra (HCC) 11/06/2023   Thoracic spine instability 11/06/2023   Chronic bilateral low back pain with left-sided sciatica 07/07/2023   Neuropathy 07/07/2023   Hx of colonic polyps 07/01/2023   Adenomatous polyp of colon 07/01/2023   Erectile disorder 01/22/2023   Coronary artery disease involving native coronary artery of native heart without angina pectoris 09/09/2022    Varicose 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 01/03/2022   Immunocompromised state due to drug therapy 09/18/2021   Prediabetes 11/08/2020   Stage 3a chronic kidney disease (HCC) 11/08/2020   Seasonal allergies 11/08/2020   Rhinosinusitis 11/08/2020   Anticoagulant disorder 11/07/2020   Senile purpura 11/07/2020   MDD (major depressive disorder), recurrent episode, mild 09/12/2019   Other spondylosis with radiculopathy, lumbar region 02/16/2019   Osteoporosis 10/12/2018   Chronic venous insufficiency 10/07/2018   Lymphedema 10/07/2018   SLE glomerulonephritis syndrome, 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HCC) 03/03/2018   Syncope and collapse 12/29/2017   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) 10/29/2016   Hyperlipidemia LDL goal <70 01/15/2016   Uncontrolled hypertension 01/15/2016   Obesity (BMI 30.0-34.9) 01/15/2016   Long term current use of anticoagulant 10/04/2015   Systemic lupus erythematosus (HCC) 05/30/2015   COPD, moderate (HCC) 06/27/2014   Shortness of breath 06/27/2014   History of pulmonary embolism 12/11/2013   Nodule of right lung 12/11/2013   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 Pt able to work on HEP over weekend. No acute hypotension episodes over weekend.   PERTINENT HISTORY:   62yoM referred to OPPT for imbalance. Pt just finished OPPT for post surgical neck  pain on 08/30/24 at [facility] so he could begin his balance therapy here. Pt has had issues with chronic orthostatic hypotension repeated syncope, fluctuating intolerance to upright. Pt has become quite good at managing his safety at home with symptoms fluctuations. PT checks BP daily, has PRN meds for excessively low or high pressures.   PAIN:  Are you having pain? 7/10  neck/shoulders; 6/10 low back   PRECAUTIONS: Fall  WEIGHT BEARING RESTRICTIONS: No  FALLS: Has patient fallen in last 6 months? Yes 1x in March 2025  LIVING ENVIRONMENT: Lives with: Alone  Lives in: 1 level  Stairs: threshold only Has following equipment at home: RW, canes, walking sticks.   PLOF: 2 years ago was able to go hiking for pleasure ab lib; used to get in and out kayak and do multi day treks overnight with kayak.   PATIENT GOALS: improve tolerance to upright mobility, stop falling as much   OBJECTIVE:  Note: Objective measures were completed at evaluation unless otherwise noted.   FUNCTIONAL TESTS:  TUG: 34sec (armless chair)  5xSTS: 54.9sec with BUE heavy use arm chair  : 14.63 Normal stance eyes closed: 9/22: 5-10sec for 3 bouts  Narrow stance eyes closed: 9/22: 17-18 sec for 3 bouts   PATIENT SURVEYS:  ABC Scale: 47.5%  TREATMENT DATE 09/26/24:  -460ft AMB overground c BRW (trial) 74m13s (pain not as aggravated as last session) -494ft AMB overground c upwalker (trial) 4m43s (pain in back/legs still fairly stable)   *AMB overground to wellzone  -Wellzone leg press: seat 12, plate 3 k84 -standing at leg press, double heel raise x20 (increased reps this time) -standing at leg press, alternate marching x15 each  -Wellzone leg press: seat 12, plate 4 k84 (increased this time) -standing at leg press, double heel raise x20 -standing at leg press, alternate marching x15  each  *AMB overground from wellzone back to rehab space  -airex pad stance x2 minutes, alternating eyes closed x3secH  09/21/24 461ft AMB, overground, no device, 34m53s -45ft AMB, overground, no device, 62m58s (more pain limited)  -airex pad stance x2 minutes, alternating eyes closed x3secH  -aitrex pad LUE ball toss/catch with 1kg ball x10, then 2kg ball x10 -airex pad LUE overhead rebounding off wall 1kg x15 -forward/backward stepping stones: floor to 2 step to foam pad to palstic step, then backwards (8x total) then 4 x lateral stepping *close minGuard assist  09/19/24 -AA/ROM, cardioaerobic fitness on NUSTEP: seat 13, arms 13x8 minutes, level 2  avoided use of RUE due to radicular history.    *AMB overground to wellzone -Wellzone leg press: seat 12, plate 3, k84 -standing at leg press, double heel raise x15 -standing at leg press, alternate marching x15 -Wellzone leg press: seat 12, plate 3, k84 -standing at leg press, double heel raise x15 -standing at leg press, alternate marching x15 -Wellzone leg press: seat 12, plate 3, k84 *AMB back to rehab gym  -sidestepping in // bars 5x bilat, hands-free when able *sit break  -alternate forward backward AMB in // bars 5x each, single UE support on bars  *sit break  -alternating 180 degrees turns in // bars, hands free when able x20   PATIENT EDUCATION: Education details: Management recommendations for orthostatic hypotension  Person educated: Programmer, applications: Discussion, handout Education comprehension: Fair   HOME EXERCISE PROGRAM: Access Code: [email or web address] URL: [email or web address] Date: 09/12/2024 Prepared by: Massie Dollar  Exercises - sit to stand, arms crossed, 28-30 inch surface   - 3 x daily - 1 sets - 8 reps - Standing Near Stance in Corner with Eyes Closed  - 3 x daily - 1 sets - 5 reps - 30 hold - Semi-Tandem Balance at Counter Top Eyes Open  - 1 x daily - 7 x weekly - 3 sets - 4 reps - 15  hold - Standing March with Counter Support  - 1 x daily - 7 x weekly - 3 sets - 10 reps - Seated Long Arc Quad  - 1 x daily - 7 x weekly - 3 sets - 10 reps - Backward Walking with Counter Support  - 1 x daily - 7 x weekly - 3 sets - 5 reps  GOALS: Goals reviewed with patient? No  SHORT TERM GOALS: Target date: 10/07/24  Pt to report no falls in most recent 4 weeks to indicate good safety practices and symptoms management in the home.  Baseline: reports orthostatic s/s roughly 2x per week.  Goal status: INITIAL  2.  Pt will demonstrate improvement in 5xSTS performance by >3 seconds.  Baseline: 54.9sec with BUE heavy use arm chair  Goal status: INITIAL  3.  Pt to demonstrate improvement in by 20% and backwards by 25%  Baseline: 9/22: forward: 14.63 sec (0.32m/s). Backward: 49.04 sec 0.33m/s  Goal status:  INITIAL  LONG TERM GOALS: Target date: 11/07/24  Pt to demonstrate tolerance of >1020ft sustained AMB c LRAD without presyncope, without LOB, and without increase in back pain >2/10 points NPRS.  Baseline: tolerated 337ft with mild-moderate increase in L hip and low back pain; 09/26/24: 474ft with Upwalker, 14m53s (no increased pain in back/hip)  Goal status: PROGRESSING  2.  5xSTS performance, standard chair, hands free <20sec; hands free from 19.5 surface height in <12sec.   Baseline:  54.9sec with BUE heavy use arm chair  Goal status: INITIAL  3.  Eye closed in narrow stance x60sec (5x without LOB and without need for hand support).  Baseline: 4-10 sec  Goal status: INITIAL  ASSESSMENT:  CLINICAL IMPRESSION: As patient has longer distance AMB goals and AMB here in clinic has easily exacerbated pain, we trialed 4WW and upwalker today for same distance as prior sessions, showing improved pain tolerance and equivocal or slightly improved speed. Pt liked UPWALKER the most, this would be a good tool to advance longer distance AMB interventions in future sessions, may  prove to be useful for home use as well. Continued with leg press to target lower sitting range hip extension, pt able to increase resistance level this date. Pt able to maintain consistent performance with routine despite fluctuating pain and variable upright tolerance. Patient will benefit from skilled physical therapy intervention to reduce deficits and impairments identified in evaluation, in order to reduce pain, improve quality of life, and maximize activity tolerance for ADL, IADL, and leisure/fitness. Physical therapy will help pt achieve long and short term goals of care.    OBJECTIVE IMPAIRMENTS: Decreased knowledge of condition, decreased use of DME, decreased mobility, difficulty walking, decreased strength, decreased ROM. ACTIVITY LIMITATIONS: Lifting, standing, walking, squatting, transfers, locomotion level PARTICIPATION LIMITATIONS: Cleaning, laundry, interpersonal relationships, driving, yardwork, community activity.  PERSONAL FACTORS: Age, behavior pattern, education, past/current experiences, transportation, profession  are also affecting patient's functional outcome.  REHAB POTENTIAL: Good CLINICAL DECISION MAKING: Medium  EVALUATION COMPLEXITY: Moderate    PLAN:  PT FREQUENCY: 1-2x/week PT DURATION: 8 weeks PLANNED INTERVENTIONS: 97110-Therapeutic exercises, 97530- Therapeutic activity, 97112- Neuromuscular re-education, 97535- Self Care, 02859- Manual therapy, [phone]- Gait training, [phone]- Electrical stimulation (unattended), [phone]- Electrical stimulation (manual), Patient/Family education, Balance training, Stair training, Cryotherapy, and Moist heat  PLAN FOR NEXT SESSION:  -conitnued leg press, Nustep for endurance building or overground AMB as tolerated, and balance training.  -advance overground AMB to 4-5 laps with upwalker x2   8:07 AM, 09/26/24 Peggye JAYSON Linear, PT, DPT Physical Therapist - [facility]  Outpatient Physical  Therapy- Main Campus [phone]

## 2024-09-26 NOTE — Telephone Encounter (Signed)
 FYI Only or Action Required?: Action required by provider: request for appointment.  Patient was last seen in primary care on 09/19/2024 by Gareth Mliss FALCON, FNP.  Called Nurse Triage reporting eye symptoms.  Symptoms began today.  Interventions attempted: Rest, hydration, or home remedies.  Symptoms are: stable. No mdrainage from eyes, symptoms woke pt. Up.  Triage Disposition: See HCP Within 4 Hours (Or PCP Triage)  Patient/caregiver understands and will follow disposition?: Yes     Copied from CRM [id number]. Topic: Clinical - Red Word Triage >> Sep 26, 2024  7:33 AM Delon HERO wrote: Red Word that prompted transfer to Nurse Triage: Patient is calling to report redness, burning that started this morning. Answer Assessment - Initial Assessment Questions 1. ONSET: When did the pain start? (e.g., minutes, hours, days)     today 2. TIMING: Does the pain come and go, or has it been constant since it started? (e.g., constant, intermittent, fleeting)     constant 3. SEVERITY: How bad is the pain?  (Scale 1-10; mild, moderate or severe)     6 4. LOCATION: Where does it hurt?  (e.g., eyelid, eye, cheekbone)     Both eyes 5. CAUSE: What do you think is causing the pain?     unsure 6. VISION: Do you have blurred vision or changes in your vision?      o 7. EYE DISCHARGE: Is there any discharge (pus) from the eye(s)?  If Yes, ask: What color is it?      no 8. FEVER: Do you have a fever? If Yes, ask: What is it, how was it measured, and when did it start?      no 9. OTHER SYMPTOMS: Do you have any other symptoms? (e.g., headache, nasal discharge, facial rash)     no 10. PREGNANCY: Is there any chance you are pregnant? When was your last menstrual period?       N/a  Protocols used: Eye Pain and Other Symptoms-A-AH  Reason for Disposition  MODERATE eye pain or discomfort (e.g., interferes with normal activities or awakens from sleep; more than mild)  Answer  Assessment - Initial Assessment Questions 1. ONSET: When did the pain start? (e.g., minutes, hours, days)     today 2. TIMING: Does the pain come and go, or has it been constant since it started? (e.g., constant, intermittent, fleeting)     constant 3. SEVERITY: How bad is the pain?  (Scale 1-10; mild, moderate or severe)     6 4. LOCATION: Where does it hurt?  (e.g., eyelid, eye, cheekbone)     Both eyes 5. CAUSE: What do you think is causing the pain?     unsure 6. VISION: Do you have blurred vision or changes in your vision?      o 7. EYE DISCHARGE: Is there any discharge (pus) from the eye(s)?  If Yes, ask: What color is it?      no 8. FEVER: Do you have a fever? If Yes, ask: What is it, how was it measured, and when did it start?      no 9. OTHER SYMPTOMS: Do you have any other symptoms? (e.g., headache, nasal discharge, facial rash)     no 10. PREGNANCY: Is there any chance you are pregnant? When was your last menstrual period?       N/a  Protocols used: Eye Pain and Other Symptoms-A-AH

## 2024-09-26 NOTE — Progress Notes (Signed)
 Name: Jonathon Snow   MRN: [id number]    DOB: August 11, 1961   Date:09/26/2024       Progress Note  Subjective  Chief Complaint  Chief Complaint  Patient presents with   Eye Burn    Woke up this am with red eyes and burning sensation    Discussed the use of AI scribe software for clinical note transcription with the patient, who gave verbal consent to proceed.  History of Present Illness Jonathon Snow is a 63 year old male who presents with eye irritation and burning.  He woke up early in the morning with burning eyes that were difficult to open, described as 'matted shut'. He applied a warm cloth to loosen them and used regular lubrication drops. After showering, the redness improved, but the burning sensation persisted.  He denies wearing contact lenses or having any eye implants. He has a history of bacterial conjunctivitis but is unsure if this is the same issue. He experiences a little photophobia, particularly with light exposure, but not with eye movement.  He is currently taking iron supplements for iron deficiency anemia. He reports that he was supposed to bring back a fecal occult blood test kit.    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) 01/20/2024   Anxiety 12/14/2023   Overweight (BMI 25.0-29.9) 11/23/2023   Chronic heart failure with preserved ejection fraction (HFpEF) (HCC) 11/23/2023   BPH (benign prostatic  hyperplasia) 11/23/2023   Unstable angina (HCC) 11/10/2023   Closed T10 spinal fracture (HCC) 11/06/2023   Closed tricolumnar fracture of thoracic vertebra (HCC) 11/06/2023   Thoracic spine instability 11/06/2023   Chronic bilateral low back pain with left-sided sciatica 07/07/2023   Neuropathy 07/07/2023   Hx of colonic polyps 07/01/2023   Adenomatous polyp of colon 07/01/2023   Erectile disorder 01/22/2023   Coronary artery disease involving native coronary artery of native heart without angina pectoris 09/09/2022   Varicose 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 01/03/2022   Immunocompromised state due to drug therapy 09/18/2021   Prediabetes 11/08/2020   Stage 3a chronic kidney disease (HCC) 11/08/2020   Seasonal allergies 11/08/2020   Rhinosinusitis 11/08/2020   Anticoagulant disorder 11/07/2020   Senile purpura 11/07/2020   MDD (major depressive disorder), recurrent episode, mild 09/12/2019   Other spondylosis with radiculopathy, lumbar region 02/16/2019   Osteoporosis 10/12/2018   Chronic venous insufficiency 10/07/2018   Lymphedema 10/07/2018   SLE glomerulonephritis syndrome, 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HCC) 03/03/2018   Syncope and collapse 12/29/2017   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) 10/29/2016   Hyperlipidemia LDL goal <70 01/15/2016   Uncontrolled hypertension 01/15/2016   Obesity (BMI 30.0-34.9) 01/15/2016   Long term current use of anticoagulant 10/04/2015   Systemic lupus erythematosus (HCC) 05/30/2015   COPD, moderate (HCC) 06/27/2014   Shortness of breath 06/27/2014   History of pulmonary embolism 12/11/2013   Nodule of right lung 12/11/2013  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   Cystic disease of liver 08/21/2013    Social History   Tobacco Use   Smoking status: Never   Smokeless tobacco: Never  Substance Use Topics   Alcohol use: No     Current Outpatient Medications:     albuterol  (VENTOLIN  HFA) 108 (90 Base) MCG/ACT inhaler, Inhale 2 puffs into the lungs every 6 (six) hours as needed for wheezing or shortness of breath., Disp: , Rfl:    apixaban  (ELIQUIS ) 5 MG TABS tablet, Take 1 tablet (5 mg total) by mouth 2 (two) times daily., Disp: 180 tablet, Rfl: 1   ARIPiprazole  (ABILIFY ) 2 MG tablet, Take 4 mg by mouth daily., Disp: , Rfl:    azelastine (ASTELIN) 0.1 % nasal spray, 2 sprays 2 (two) times daily., Disp: , Rfl:    BENLYSTA 200 MG/ML SOSY, , Disp: , Rfl:    busPIRone  (BUSPAR ) 5 MG tablet, Take 1 tablet (5 mg total) by mouth 2 (two) times daily., Disp: 60 tablet, Rfl: 1   cholecalciferol  (VITAMIN D3) 25 MCG (1000 UNIT) tablet, Take 1,000 Units by mouth daily., Disp: , Rfl:    clonazePAM  (KLONOPIN ) 0.5 MG tablet, Take 0.5 mg by mouth daily., Disp: , Rfl:    clopidogrel  (PLAVIX ) 75 MG tablet, TAKE 1 TABLET(75 MG) BY MOUTH DAILY, Disp: 90 tablet, Rfl: 0   cyanocobalamin  (VITAMIN B12) 1000 MCG/ML injection, Vit B12 1000 mcg IM once a week for 3 weeks then once a month for 4 months. During and after loading dose with injection treatment patient should also take 1000 mcg by mouth once a day., Disp: , Rfl:    famotidine  (PEPCID ) 20 MG tablet, TAKE 1 TABLET(20 MG) BY MOUTH TWICE DAILY, Disp: 180 tablet, Rfl: 0   fluticasone  (FLONASE ) 50 MCG/ACT nasal spray, Place 2 sprays into both nostrils daily., Disp: 16 g, Rfl: 6   gabapentin  (NEURONTIN ) 300 MG capsule, Take 1 capsule (300 mg total) by mouth 2 (two) times daily., Disp: 60 capsule, Rfl: 2   hydrALAZINE  (APRESOLINE ) 25 MG tablet, Take 1 tablet (25 mg total) by mouth 3 (three) times daily., Disp: 270 tablet, Rfl: 1   [START ON 09/29/2024] HYDROcodone -acetaminophen  (NORCO/VICODIN) 5-325 MG tablet, Take 1 tablet by mouth every 8 (eight) hours as needed for severe pain (pain score 7-10). Must last 30 days, Disp: 90 tablet, Rfl: 0   hydroxychloroquine (PLAQUENIL) 200 MG tablet, Take 200 mg by mouth 2 (two) times daily., Disp:  , Rfl:    hydrOXYzine  (ATARAX ) 25 MG tablet, Take 25-50 mg by mouth at bedtime as needed., Disp: , Rfl:    JOURNAVX  50 MG TABS, Take 50 mg by mouth every 12 (twelve) hours., Disp: 60 tablet, Rfl: 0   loratadine  (CLARITIN ) 10 MG tablet, Take 10 mg by mouth daily., Disp: , Rfl:    losartan  (COZAAR ) 25 MG tablet, Take 25 mg by mouth daily., Disp: , Rfl:    meclizine  (ANTIVERT ) 12.5 MG tablet, Take 1 tablet (12.5 mg total) by mouth 3 (three) times daily as needed for dizziness., Disp: 30 tablet, Rfl: 0   metoprolol  succinate (TOPROL -XL) 25 MG 24 hr tablet, Take 1 tablet (25 mg total) by mouth daily., Disp: 90 tablet, Rfl: 3   midodrine  (PROAMATINE ) 10 MG tablet, Take 10 mg by mouth 3 (three) times daily., Disp: , Rfl:    montelukast  (SINGULAIR ) 10 MG tablet, Take 1 tablet (10 mg total) by mouth at bedtime., Disp: 90 tablet, Rfl: 1   Multiple Vitamins-Minerals (MULTIVITAMIN WITH  MINERALS) tablet, Take 1 tablet by mouth daily., Disp: , Rfl:    mycophenolate  (CELLCEPT ) 500 MG tablet, Take 2 tablets (1,000 mg total) by mouth 2 (two) times daily. HOLD until neurosurgery is ok to restart this (waiting for incision to heal), Disp: , Rfl:    oxybutynin  (DITROPAN  XL) 15 MG 24 hr tablet, Take 15 mg by mouth daily., Disp: , Rfl:    pantoprazole  (PROTONIX ) 20 MG tablet, Take 1 tablet (20 mg total) by mouth 2 (two) times daily., Disp: 180 tablet, Rfl: 3   rosuvastatin  (CRESTOR ) 20 MG tablet, Take 1 tablet (20 mg total) by mouth daily., Disp: 90 tablet, Rfl: 1   sertraline  (ZOLOFT ) 100 MG tablet, Take 1.5 tablets (150 mg total) by mouth daily., Disp: 135 tablet, Rfl: 1   solifenacin  (VESICARE ) 5 MG tablet, Take 5 mg by mouth daily., Disp: , Rfl:    Syringe/Needle, Disp, (SYRINGE 3CC/25GX1) 25G X 1 3 ML MISC, To be used with Vit B12 1000 mcg IM once a week for 4 weeks then once a month for 4 months. During and after loading dose with injection treatment patient should also take 1000 mcg by mouth once a day., Disp: ,  Rfl:    tadalafil  (CIALIS ) 20 MG tablet, Take 1 tablet (20 mg total) by mouth daily as needed for erectile dysfunction. Avoid taking it if low blood pressure, Disp: 30 tablet, Rfl: 7   tamsulosin  (FLOMAX ) 0.4 MG CAPS capsule, Take 1 capsule (0.4 mg total) by mouth daily., Disp: 30 capsule, Rfl: 11   carbidopa-levodopa (SINEMET IR) 25-100 MG tablet, Take by mouth. (Patient not taking: Reported on 09/26/2024), Disp: , Rfl:   Allergies  Allergen Reactions   Enalapril Other (See Comments)    Unknown reaction   Vicodin [Hydrocodone -Acetaminophen ] Hives and Rash    Severe headaches (also) NAME BRAND ONLY PER PT CAN TAKE GENERIC    ROS  Ten systems reviewed and is negative except as mentioned in HPI    Objective  Vitals:   09/26/24 0918  BP: 116/76  Pulse: 76  Resp: 18  SpO2: 94%  Weight: 218 lb 11.2 oz (99.2 kg)  Height: 6' 2 (1.88 m)    Body mass index is 28.08 kg/m.  Physical Exam CONSTITUTIONAL: Patient appears well-developed and well-nourished. No distress. HEENT: Head atraumatic, normocephalic, neck supple. Eyes normal with pupils equal, round, and reactive to light. Extraocular movements intact. Conjunctiva not injected. CARDIOVASCULAR: Normal rate, regular rhythm and normal heart sounds. No murmur heard. No BLE edema. PULMONARY: Effort normal and breath sounds normal. No respiratory distress. PSYCHIATRIC: Patient has a normal mood and affect. Behavior is normal. Judgment and thought content normal.  Recent Results (from the past 2160 hours)  Compliance Drug Analysis, Ur     Status: None   Collection Time: 07/19/24  2:07 PM  Result Value Ref Range   Summary FINAL     Comment: ==================================================================== Compliance Drug Analysis, Ur ==================================================================== Test                             Result       Flag       Units  Drug Present and Declared for Prescription Verification    Metoprolol                      PRESENT      EXPECTED  Drug Present not Declared for Prescription Verification   Tramadol   166          UNEXPECTED ng/mg creat   O-Desmethyltramadol            1291         UNEXPECTED ng/mg creat    Source of tramadol  is a prescription medication. O-desmethyltramadol    is an expected metabolite of tramadol .  Drug Absent but Declared for Prescription Verification   Clonazepam                      Not Detected UNEXPECTED ng/mg creat   Sertraline                      Not Detected UNEXPECTED   Aripiprazole                    Not Detected UNEXPECTED ==================================================================== Test                       Result    Flag   Units      Ref Range   Creatinine              94               mg/dL      >=79 ==================================================================== Declared Medications:  The flagging and interpretation on this report are based on the  following declared medications.  Unexpected results may arise from  inaccuracies in the declared medications.   **Note: The testing scope of this panel includes these medications:   Aripiprazole  (Abilify )  Clonazepam  (Klonopin )  Metoprolol   Sertraline  (Zoloft )   **Note: The testing scope of this panel does not include the  following reported medications:   Apixaban  (Eliquis )  Azelastine (Astelin)  Belimumab (Benlysta)  Carbidopa (Sinemet)  Clopidogrel  (Plavix )  Levodopa (Sinemet)  Midodrine  (Proamatine )  Montelukast  (Singulair )  Multivitamin  Mycophenolate  mofetil (Cellcept )  Oxybutynin  (Ditropan )  Pantoprazole  (Protonix )  Rosuvastatin  (Crestor )  Solifenacin  (Vesicare )  Vitamin B12  Vitamin D3 ===== =============================================================== For clinical consultation, please call [phone]. ====================================================================   HgB A1c     Status: Abnormal   Collection Time:  07/28/24  3:31 PM  Result Value Ref Range   Hgb A1c MFr Bld 5.7 (H) <5.7 %    Comment: For someone without known diabetes, a hemoglobin  A1c value between 5.7% and 6.4% is consistent with prediabetes and should be confirmed with a  follow-up test. . For someone with known diabetes, a value <7% indicates that their diabetes is well controlled. A1c targets should be individualized based on duration of diabetes, age, comorbid conditions, and other considerations. . This assay result is consistent with an increased risk of diabetes. . Currently, no consensus exists regarding use of hemoglobin A1c for diagnosis of diabetes for children. .    Mean Plasma Glucose 117 mg/dL   eAG (mmol/L) 6.5 mmol/L  Comprehensive Metabolic Panel (CMET)     Status: Abnormal   Collection Time: 07/28/24  3:31 PM  Result Value Ref Range   Glucose, Bld 92 65 - 99 mg/dL    Comment: .            Fasting reference interval .    BUN 24 7 - 25 mg/dL   Creat 8.40 (H) 9.29 - 1.35 mg/dL   eGFR 49 (L) > OR = 60 mL/min/1.70m2   BUN/Creatinine Ratio 15 6 - 22 (calc)   Sodium 137 135 - 146 mmol/L   Potassium 4.1 3.5 - 5.3 mmol/L  Chloride 106 98 - 110 mmol/L   CO2 23 20 - 32 mmol/L   Calcium  9.2 8.6 - 10.3 mg/dL   Total Protein 7.0 6.1 - 8.1 g/dL   Albumin  4.1 3.6 - 5.1 g/dL   Globulin 2.9 1.9 - 3.7 g/dL (calc)   AG Ratio 1.4 1.0 - 2.5 (calc)   Total Bilirubin 0.6 0.2 - 1.2 mg/dL   Alkaline phosphatase (APISO) 98 35 - 144 U/L   AST 11 10 - 35 U/L   ALT 9 9 - 46 U/L  CBC with Differential/Platelet     Status: Abnormal   Collection Time: 07/28/24  3:31 PM  Result Value Ref Range   WBC 4.7 3.8 - 10.8 Thousand/uL   RBC 4.89 4.20 - 5.80 Million/uL   Hemoglobin 13.3 13.2 - 17.1 g/dL   HCT 56.8 61.4 - 49.9 %   MCV 88.1 80.0 - 100.0 fL   MCH 27.2 27.0 - 33.0 pg   MCHC 30.9 (L) 32.0 - 36.0 g/dL    Comment: For adults, a slight decrease in the calculated MCHC value (in the range of 30 to 32 g/dL) is most  likely not clinically significant; however, it should be interpreted with caution in correlation with other red cell parameters and the patient's clinical condition.    RDW 14.4 11.0 - 15.0 %   Platelets 189 140 - 400 Thousand/uL   MPV 11.5 7.5 - 12.5 fL   Neutro Abs 2,829 1,500 - 7,800 cells/uL   Absolute Lymphocytes 1,180 850 - 3,900 cells/uL   Absolute Monocytes 564 200 - 950 cells/uL   Eosinophils Absolute 108 15 - 500 cells/uL   Basophils Absolute 19 0 - 200 cells/uL   Neutrophils Relative % 60.2 %   Total Lymphocyte 25.1 %   Monocytes Relative 12.0 %   Eosinophils Relative 2.3 %   Basophils Relative 0.4 %  Vitamin B12     Status: None   Collection Time: 07/28/24  3:31 PM  Result Value Ref Range   Vitamin B-12 796 200 - 1,100 pg/mL  Iron, TIBC and Ferritin Panel     Status: Abnormal   Collection Time: 07/28/24  3:31 PM  Result Value Ref Range   Iron 51 50 - 180 mcg/dL   TIBC 654 749 - 574 mcg/dL (calc)   %SAT 15 (L) 20 - 48 % (calc)   Ferritin 23 (L) 24 - 380 ng/mL      Assessment & Plan Bacterial conjunctivitis, bilateral Bilateral bacterial conjunctivitis with burning, matting, and photophobia. Pus and matting suggest bacterial etiology. Conjunctiva not significantly injected, likely due to prior lubrication drops. - Prescribed Cipro  ophthalmic drops, 1-2 drops in both eyes every 2 hours for the first day, then 1 drop every 4 hours for 5 days. - Advised warm compresses and proper eye hygiene. - Instructed to follow up with an eye doctor if symptoms worsen or significant pain occurs.  Iron deficiency without anemia Iron deficiency noted without anemia. He has been taking iron supplements. - Continue iron supplements as previously prescribed. - Avoid iron supplements for one week prior to hemocult test.

## 2024-09-27 ENCOUNTER — Ambulatory Visit

## 2024-09-28 ENCOUNTER — Ambulatory Visit

## 2024-09-28 DIAGNOSIS — R42 Dizziness and giddiness: Secondary | ICD-10-CM | POA: Diagnosis not present

## 2024-09-28 DIAGNOSIS — I951 Orthostatic hypotension: Secondary | ICD-10-CM | POA: Diagnosis not present

## 2024-09-28 DIAGNOSIS — M542 Cervicalgia: Secondary | ICD-10-CM | POA: Diagnosis not present

## 2024-09-28 DIAGNOSIS — R2681 Unsteadiness on feet: Secondary | ICD-10-CM

## 2024-09-28 DIAGNOSIS — I890 Lymphedema, not elsewhere classified: Secondary | ICD-10-CM | POA: Diagnosis not present

## 2024-09-28 DIAGNOSIS — R262 Difficulty in walking, not elsewhere classified: Secondary | ICD-10-CM

## 2024-09-28 DIAGNOSIS — M5459 Other low back pain: Secondary | ICD-10-CM | POA: Diagnosis not present

## 2024-09-28 DIAGNOSIS — M5416 Radiculopathy, lumbar region: Secondary | ICD-10-CM | POA: Diagnosis not present

## 2024-09-28 DIAGNOSIS — M5412 Radiculopathy, cervical region: Secondary | ICD-10-CM

## 2024-09-28 NOTE — Therapy (Signed)
 OUTPATIENT PHYSICAL THERAPY TREATMENT Patient Name: Jonathon Snow MRN: [id number] DOB:Apr 11, 1961, 63 y.o., male Today's Date: 09/28/2024  PCP: [name], PA-C REFERRING PROVIDER: Maree Jannett POUR, MD  END OF SESSION:  PT End of Session - 09/28/24 0803     Visit Number 7    Number of Visits 16    Date for Recertification  11/02/24    Authorization Type [place] Medicaid    Authorization Time Period 09/07/24-11/02/24    Progress Note Due on Visit 10    PT Start Time 0758    PT Stop Time 0838    PT Time Calculation (min) 40 min    Equipment Utilized During Treatment Gait belt    Activity Tolerance Patient tolerated treatment well;No increased pain    Behavior During Therapy [facility] for tasks assessed/performed          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   Overview:  superior sagittal sinus, left transverse sinus and cortical veins    Closed left hip fracture, initial encounter (HCC) 02/18/2023   COPD (chronic obstructive pulmonary disease) (HCC)    COVID-19 virus infection 12/2020   Depression    DVT (deep venous thrombosis) (HCC)    Dyspnea    GERD (gastroesophageal reflux disease)    Head injury 11/05/2023   Heart attack (HCC) 11/05/2023   Heel spur, left 02/16/2019   Heel spur, right 02/16/2019   Hematoma of groin 11/08/2023   Herpes zoster infection of lumbar region 02/20/2020   History of DVT (deep vein thrombosis) 03/22/2020   History of kidney stones    HTN (hypertension)    Hyperlipidemia    Hypertension    Long term current use of systemic steroids 11/08/2020   Lupus    Lymphedema 10/07/2018   Morbid obesity (HCC)    Opiate abuse, episodic (HCC) 02/26/2018   Osteoporosis    Pain and swelling of right lower  extremity 02/03/2023   Pneumonia    PONV (postoperative nausea and vomiting)    Postphlebitic syndrome with ulcer, 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   Presence of IVC filter 03/22/2020   Removed   Pulmonary embolism (HCC)    Renal disorder    Stage III   Severe episode of recurrent major depressive disorder, 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   Shock circulatory (HCC) 11/06/2023   Sleep apnea    STEMI (ST elevation myocardial infarction) (HCC) 11/05/2023   Past Surgical History:  Procedure Laterality Date   ANKLE SURGERY Right    BACK SURGERY     BRONCHIAL WASHINGS N/A 11/01/2021   Procedure: BRONCHIAL WASHINGS;  Surgeon: Parris Manna, MD;  Location: [facility] ORS;  Service: Thoracic;  Laterality: N/A;   COLONOSCOPY WITH PROPOFOL  N/A 05/28/2020   Procedure: COLONOSCOPY WITH PROPOFOL ;  Surgeon: Therisa Bi, MD;  Location: [facility] ENDOSCOPY;  Service: Endoscopy;  Laterality: N/A;   COLONOSCOPY WITH PROPOFOL  N/A 07/01/2023   Procedure: COLONOSCOPY WITH PROPOFOL ;  Surgeon: Therisa Bi, MD;  Location: [facility] ENDOSCOPY;  Service: Gastroenterology;  Laterality: N/A;   CORONARY ULTRASOUND/IVUS N/A 11/05/2023   Procedure: Coronary Ultrasound/IVUS;  Surgeon: Mady Bruckner, MD;  Location: [facility] INVASIVE CV LAB;  Service: Cardiovascular;  Laterality: N/A;   CORONARY/GRAFT ACUTE MI REVASCULARIZATION N/A 11/05/2023   Procedure: Coronary/Graft Acute MI Revascularization;  Surgeon: Mady Bruckner, MD;  Location: [facility] INVASIVE CV LAB;  Service: Cardiovascular;  Laterality: N/A;   CYST EXCISION  92 or 93    Liver cyst removal [facility]   FLEXIBLE BRONCHOSCOPY N/A 11/01/2021   Procedure: FLEXIBLE BRONCHOSCOPY;  Surgeon: Parris Manna, MD;  Location: [facility] ORS;  Service: Thoracic;  Laterality: N/A;   FRACTURE SURGERY     HIP PINNING,CANNULATED Left 02/19/2023   Procedure: PERCUTANEOUS FIXATION OF FEMORAL NECK;  Surgeon: Tobie Priest, MD;  Location: [facility] ORS;  Service: Orthopedics;  Laterality: Left;   I & D  EXTREMITY Right 04/29/2017   Procedure: IRRIGATION AND DEBRIDEMENT EXTREMITY;  Surgeon: Shelva Dunnings, MD;  Location: [facility] ORS;  Service: General;  Laterality: Right;   IRRIGATION AND DEBRIDEMENT ABSCESS Left 04/29/2017   Procedure: IRRIGATION AND DEBRIDEMENT Scrotal ABSCESS;  Surgeon: Shelva Dunnings, MD;  Location: [facility] ORS;  Service: General;  Laterality: Left;   LEFT HEART CATH AND CORONARY ANGIOGRAPHY N/A 11/05/2023   Procedure: LEFT HEART CATH AND CORONARY ANGIOGRAPHY;  Surgeon: Mady Bruckner, MD;  Location: [facility] INVASIVE CV LAB;  Service: Cardiovascular;  Laterality: N/A;   POLYPECTOMY  07/01/2023   Procedure: POLYPECTOMY;  Surgeon: Therisa Bi, MD;  Location: [facility] ENDOSCOPY;  Service: Gastroenterology;;   SPINE SURGERY     Patient Active Problem List   Diagnosis Date Noted   B12 deficiency 07/28/2024   Vitamin D  deficiency 07/28/2024   Cervical radicular pain 05/26/2024   History of thoracic spinal fusion (T7-T12) 05/26/2024   History of lumbar spinal fusion (L5-S1) 05/26/2024   Chronic pain syndrome 05/26/2024   Anxiety and depression 02/26/2024   Dyslipidemia 02/26/2024   Peripheral neuropathy 02/26/2024   Essential hypertension 02/26/2024   Vertigo 02/26/2024   Orthostatic hypotension 02/11/2024   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) 01/20/2024   Anxiety 12/14/2023   Overweight (BMI 25.0-29.9) 11/23/2023   Chronic heart failure with preserved ejection fraction (HFpEF) (HCC) 11/23/2023   BPH (benign prostatic hyperplasia) 11/23/2023   Unstable angina (HCC) 11/10/2023   Closed T10 spinal fracture (HCC) 11/06/2023   Closed tricolumnar fracture of thoracic vertebra (HCC) 11/06/2023   Thoracic spine instability 11/06/2023   Chronic bilateral low back pain with left-sided sciatica 07/07/2023   Neuropathy 07/07/2023   Hx of colonic polyps 07/01/2023   Adenomatous polyp of colon 07/01/2023   Erectile disorder 01/22/2023   Coronary artery  disease involving native coronary artery of native heart without angina pectoris 09/09/2022   Varicose 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 01/03/2022   Immunocompromised state due to drug therapy 09/18/2021   Prediabetes 11/08/2020   Stage 3a chronic kidney disease (HCC) 11/08/2020   Seasonal allergies 11/08/2020   Rhinosinusitis 11/08/2020   Anticoagulant disorder 11/07/2020   Senile purpura 11/07/2020   MDD (major depressive disorder), recurrent episode, mild 09/12/2019   Other spondylosis with radiculopathy, lumbar region 02/16/2019   Osteoporosis 10/12/2018   Chronic venous insufficiency 10/07/2018   Lymphedema 10/07/2018   SLE glomerulonephritis syndrome, 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HCC) 03/03/2018   Syncope and collapse 12/29/2017   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) 10/29/2016   Hyperlipidemia LDL goal <70 01/15/2016   Obesity (BMI 30.0-34.9) 01/15/2016   Long term current use of anticoagulant 10/04/2015   Systemic lupus erythematosus (HCC) 05/30/2015   COPD, moderate (HCC) 06/27/2014   History of pulmonary embolism 12/11/2013   Nodule of right lung 12/11/2013   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   Cystic disease of  liver 08/21/2013   ONSET DATE: November 2024   REFERRING DIAG: imbalance  THERAPY DIAG:  Syncope due to orthostatic hypotension  Unsteadiness on feet  Cervicalgia  Radiculopathy, cervical region  Other low back pain  Difficulty in walking, not elsewhere classified  Rationale for Evaluation and Treatment: Rehabilitation  SUBJECTIVE:                                                                                                                                                                                            SUBJECTIVE STATEMENT: Pt doing well today in general. Pt reports he was predicatbly sore in glutes after increased weight on Monday.  PERTINENT HISTORY:   62yoM referred  to OPPT for imbalance. Pt just finished OPPT for post surgical neck pain on 08/30/24 at [facility] so he could begin his balance therapy here. Pt has had issues with chronic orthostatic hypotension repeated syncope, fluctuating intolerance to upright. Pt has become quite good at managing his safety at home with symptoms fluctuations. PT checks BP daily, has PRN meds for excessively low or high pressures.   PAIN:  Are you having pain? 6/10  neck/shoulders; 6/10 low back   PRECAUTIONS: Fall  WEIGHT BEARING RESTRICTIONS: No  FALLS: Has patient fallen in last 6 months? Yes 1x in March 2025  LIVING ENVIRONMENT: Lives with: Alone  Lives in: 1 level  Stairs: threshold only Has following equipment at home: RW, canes, walking sticks.   PLOF: 2 years ago was able to go hiking for pleasure ab lib; used to get in and out kayak and do multi day treks overnight with kayak.   PATIENT GOALS: improve tolerance to upright mobility, stop falling as much   OBJECTIVE:  Note: Objective measures were completed at evaluation unless otherwise noted.   FUNCTIONAL TESTS:  TUG: 34sec (armless chair)  5xSTS: 54.9sec with BUE heavy use arm chair  : 14.63 Normal stance eyes closed: 9/22: 5-10sec for 3 bouts  Narrow stance eyes closed: 9/22: 17-18 sec for 3 bouts   PATIENT SURVEYS:  ABC Scale: 47.5%  TREATMENT DATE 09/28/24:  -overground AMB x670ft (increased today) upwalker: 29m46s, pain stable *seated rest -overground AMB x69ft (increased today) upwalker: 74m53s *seated rest   *AMB to wellzone, no device  -leg press, seat 12, 1x15, plate 4 (increased previous session) -standing marching x40 (20 per leg) BUE support on leg press (increased reps today)  -standing heel raises x25, BUE support on leg press -leg press, seat 12, 1x15, plate 4 (increased previous session) -standing  marching x40 (20 per leg) BUE support on leg press (increased reps today)  -standing heel raises x25, BUE support on leg press  *AMB back to rehab area  -Side stepping in // bars for motor control training x3 each -backward stepping // bars for motor control training x6 -forward stepup, retor step down on airex pad, hands-free when able (in // bars)    09/26/24 -443ft AMB overground c BRW (trial) 36m13s (pain not as aggravated as last session) -428ft AMB overground c upwalker (trial) 64m43s (pain in back/legs still fairly stable)   *AMB overground to wellzone  -Wellzone leg press: seat 12, plate 3 k84 -standing at leg press, double heel raise x20 (increased reps this time) -standing at leg press, alternate marching x15 each  -Wellzone leg press: seat 12, plate 4 k84 (increased this time) -standing at leg press, double heel raise x20 -standing at leg press, alternate marching x15 each  *AMB overground from wellzone back to rehab space  -airex pad stance x2 minutes, alternating eyes closed x3secH  09/21/24 462ft AMB, overground, no device, 70m53s -44ft AMB, overground, no device, 18m58s (more pain limited)  -airex pad stance x2 minutes, alternating eyes closed x3secH  -aitrex pad LUE ball toss/catch with 1kg ball x10, then 2kg ball x10 -airex pad LUE overhead rebounding off wall 1kg x15 -forward/backward stepping stones: floor to 2 step to foam pad to palstic step, then backwards (8x total) then 4 x lateral stepping *close minGuard assist  09/19/24 -AA/ROM, cardioaerobic fitness on NUSTEP: seat 13, arms 13x8 minutes, level 2  avoided use of RUE due to radicular history.    *AMB overground to wellzone -Wellzone leg press: seat 12, plate 3, k84 -standing at leg press, double heel raise x15 -standing at leg press, alternate marching x15 -Wellzone leg press: seat 12, plate 3, k84 -standing at leg press, double heel raise x15 -standing at leg press, alternate marching  x15 -Wellzone leg press: seat 12, plate 3, k84 *AMB back to rehab gym  -sidestepping in // bars 5x bilat, hands-free when able *sit break  -alternate forward backward AMB in // bars 5x each, single UE support on bars  *sit break  -alternating 180 degrees turns in // bars, hands free when able x20   PATIENT EDUCATION: Education details: Management recommendations for orthostatic hypotension  Person educated: Programmer, applications: Discussion, handout Education comprehension: Fair   HOME EXERCISE PROGRAM: Access Code: [email or web address] URL: [email or web address] Date: 09/12/2024 Prepared by: Massie Dollar  Exercises - sit to stand, arms crossed, 28-30 inch surface   - 3 x daily - 1 sets - 8 reps - Standing Near Stance in Corner with Eyes Closed  - 3 x daily - 1 sets - 5 reps - 30 hold - Semi-Tandem Balance at Counter Top Eyes Open  - 1 x daily - 7 x weekly - 3 sets - 4 reps - 15 hold - Standing March with Counter Support  - 1 x daily - 7 x weekly - 3 sets - 10 reps - Seated Long Arc Quad  -  1 x daily - 7 x weekly - 3 sets - 10 reps - Backward Walking with Counter Support  - 1 x daily - 7 x weekly - 3 sets - 5 reps  GOALS: Goals reviewed with patient? No  SHORT TERM GOALS: Target date: 10/07/24  Pt to report no falls in most recent 4 weeks to indicate good safety practices and symptoms management in the home.  Baseline: reports orthostatic s/s roughly 2x per week.  Goal status: INITIAL  2.  Pt will demonstrate improvement in 5xSTS performance by >3 seconds.  Baseline: 54.9sec with BUE heavy use arm chair  Goal status: INITIAL  3.  Pt to demonstrate improvement in by 20% and backwards by 25%  Baseline: 9/22: forward: 14.63 sec (0.36m/s). Backward: 49.04 sec 0.53m/s  Goal status: INITIAL  LONG TERM GOALS: Target date: 11/07/24  Pt to demonstrate tolerance of >1017ft sustained AMB c LRAD without presyncope, without LOB, and without increase in back pain  >2/10 points NPRS.  Baseline: tolerated 3102ft with mild-moderate increase in L hip and low back pain; 09/26/24: 436ft with Upwalker, 25m53s (no increased pain in back/hip)  Goal status: PROGRESSING  2.  5xSTS performance, standard chair, hands free <20sec; hands free from 19.5 surface height in <12sec.   Baseline:  54.9sec with BUE heavy use arm chair  Goal status: INITIAL  3.  Eye closed in narrow stance x60sec (5x without LOB and without need for hand support).  Baseline: 4-10 sec  Goal status: INITIAL  ASSESSMENT:  CLINICAL IMPRESSION: Pt able to advance overground AMB to 654ft with upwalker, steady pacing, pain controlled. Author still feels upwalker would be a good tool to advance longer distance AMB interventions in community, discussed at length with patient. Continued with leg press to target lower sitting range hip extension, pt able to increase resistance level this date. Pt able to maintain consistent performance with routine despite fluctuating pain and variable upright tolerance. Patient will benefit from skilled physical therapy intervention to reduce deficits and impairments identified in evaluation, in order to reduce pain, improve quality of life, and maximize activity tolerance for ADL, IADL, and leisure/fitness. Physical therapy will help pt achieve long and short term goals of care.    OBJECTIVE IMPAIRMENTS: Decreased knowledge of condition, decreased use of DME, decreased mobility, difficulty walking, decreased strength, decreased ROM. ACTIVITY LIMITATIONS: Lifting, standing, walking, squatting, transfers, locomotion level PARTICIPATION LIMITATIONS: Cleaning, laundry, interpersonal relationships, driving, yardwork, community activity.  PERSONAL FACTORS: Age, behavior pattern, education, past/current experiences, transportation, profession  are also affecting patient's functional outcome.  REHAB POTENTIAL: Good CLINICAL DECISION MAKING: Medium  EVALUATION COMPLEXITY:  Moderate    PLAN:  PT FREQUENCY: 1-2x/week PT DURATION: 8 weeks PLANNED INTERVENTIONS: 97110-Therapeutic exercises, 97530- Therapeutic activity, 97112- Neuromuscular re-education, 97535- Self Care, [id number]- Manual therapy, [phone]- Gait training, [phone]- Electrical stimulation (unattended), [phone]- Electrical stimulation (manual), Patient/Family education, Balance training, Stair training, Cryotherapy, and Moist heat  PLAN FOR NEXT SESSION:  -conitnued leg press, overground balance training with wlaking sparingly due to easily provoked back issues -continue overground AMB of 4-5 laps with upwalker, start adding 2-3lb AW    8:19 AM, 09/28/24 Peggye JAYSON Linear, PT, DPT Physical Therapist - [facility]  Outpatient Physical Therapy- Main Campus [phone]

## 2024-09-30 ENCOUNTER — Ambulatory Visit: Admitting: Physical Therapy

## 2024-10-03 ENCOUNTER — Encounter: Payer: Self-pay | Admitting: Family Medicine

## 2024-10-03 ENCOUNTER — Encounter: Payer: Self-pay | Admitting: Neurosurgery

## 2024-10-03 ENCOUNTER — Ambulatory Visit

## 2024-10-03 DIAGNOSIS — M5459 Other low back pain: Secondary | ICD-10-CM | POA: Diagnosis not present

## 2024-10-03 DIAGNOSIS — R42 Dizziness and giddiness: Secondary | ICD-10-CM | POA: Diagnosis not present

## 2024-10-03 DIAGNOSIS — R2681 Unsteadiness on feet: Secondary | ICD-10-CM

## 2024-10-03 DIAGNOSIS — I951 Orthostatic hypotension: Secondary | ICD-10-CM

## 2024-10-03 DIAGNOSIS — M5412 Radiculopathy, cervical region: Secondary | ICD-10-CM | POA: Diagnosis not present

## 2024-10-03 DIAGNOSIS — R262 Difficulty in walking, not elsewhere classified: Secondary | ICD-10-CM | POA: Diagnosis not present

## 2024-10-03 DIAGNOSIS — M542 Cervicalgia: Secondary | ICD-10-CM | POA: Diagnosis not present

## 2024-10-03 DIAGNOSIS — M5416 Radiculopathy, lumbar region: Secondary | ICD-10-CM | POA: Diagnosis not present

## 2024-10-03 DIAGNOSIS — I890 Lymphedema, not elsewhere classified: Secondary | ICD-10-CM | POA: Diagnosis not present

## 2024-10-03 NOTE — Therapy (Signed)
 OUTPATIENT PHYSICAL THERAPY TREATMENT Patient Name: Jonathon Snow MRN: [id number] DOB:30-Dec-1960, 63 y.o., male Today's Date: 10/03/2024  PCP: [name], PA-C REFERRING PROVIDER: [name], PA-C  END OF SESSION:  PT End of Session - 10/03/24 0812     Visit Number 8    Number of Visits 16    Date for Recertification  11/02/24    Authorization Type [place] [employer]    Authorization Time Period 09/07/24-11/02/24    Progress Note Due on Visit 10    PT Start Time 0805    PT Stop Time 0845    PT Time Calculation (min) 40 min    Activity Tolerance Patient tolerated treatment well;No increased pain;Patient limited by pain    Behavior During Therapy [facility] for tasks assessed/performed          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   Overview:  superior sagittal sinus, left transverse sinus and cortical veins    Closed left hip fracture, initial encounter (HCC) 02/18/2023   COPD (chronic obstructive pulmonary disease) (HCC)    COVID-19 virus infection 12/2020   Depression    DVT (deep venous thrombosis) (HCC)    Dyspnea    GERD (gastroesophageal reflux disease)    Head injury 11/05/2023   Heart attack (HCC) 11/05/2023   Heel spur, left 02/16/2019   Heel spur, right 02/16/2019   Hematoma of groin 11/08/2023   Herpes zoster infection of lumbar region 02/20/2020   History of DVT (deep vein thrombosis) 03/22/2020   History of kidney stones    HTN (hypertension)    Hyperlipidemia    Hypertension    Long term current use of systemic steroids 11/08/2020   Lupus    Lymphedema 10/07/2018   Morbid obesity (HCC)    Opiate abuse, episodic (HCC) 02/26/2018   Osteoporosis    Pain and swelling of right lower extremity 02/03/2023    Pneumonia    PONV (postoperative nausea and vomiting)    Postphlebitic syndrome with ulcer, 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   Presence of IVC filter 03/22/2020   Removed   Pulmonary embolism (HCC)    Renal disorder    Stage III   Severe episode of recurrent major depressive disorder, 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   Shock circulatory (HCC) 11/06/2023   Sleep apnea    STEMI (ST elevation myocardial infarction) (HCC) 11/05/2023   Past Surgical History:  Procedure Laterality Date   ANKLE SURGERY Right    BACK SURGERY     BRONCHIAL WASHINGS N/A 11/01/2021   Procedure: BRONCHIAL WASHINGS;  Surgeon: Parris Manna, MD;  Location: [facility] ORS;  Service: Thoracic;  Laterality: N/A;   COLONOSCOPY WITH PROPOFOL  N/A 05/28/2020   Procedure: COLONOSCOPY WITH PROPOFOL ;  Surgeon: Therisa Bi, MD;  Location: [facility] ENDOSCOPY;  Service: Endoscopy;  Laterality: N/A;   COLONOSCOPY WITH PROPOFOL  N/A 07/01/2023   Procedure: COLONOSCOPY WITH PROPOFOL ;  Surgeon: Therisa Bi, MD;  Location: [facility] ENDOSCOPY;  Service: Gastroenterology;  Laterality: N/A;   CORONARY ULTRASOUND/IVUS N/A 11/05/2023   Procedure: Coronary Ultrasound/IVUS;  Surgeon: Mady Bruckner, MD;  Location: [facility] INVASIVE CV LAB;  Service: Cardiovascular;  Laterality: N/A;   CORONARY/GRAFT ACUTE MI REVASCULARIZATION N/A 11/05/2023   Procedure: Coronary/Graft Acute MI Revascularization;  Surgeon: Mady Bruckner, MD;  Location:  [facility] INVASIVE CV LAB;  Service: Cardiovascular;  Laterality: N/A;   CYST EXCISION  92 or 93    Liver cyst removal [facility]   FLEXIBLE BRONCHOSCOPY N/A 11/01/2021   Procedure: FLEXIBLE BRONCHOSCOPY;  Surgeon: Parris Manna, MD;  Location: [facility] ORS;  Service: Thoracic;  Laterality: N/A;   FRACTURE SURGERY     HIP PINNING,CANNULATED Left 02/19/2023   Procedure: PERCUTANEOUS FIXATION OF FEMORAL NECK;  Surgeon: Tobie Priest, MD;  Location: [facility] ORS;  Service: Orthopedics;  Laterality: Left;   I & D EXTREMITY Right 04/29/2017    Procedure: IRRIGATION AND DEBRIDEMENT EXTREMITY;  Surgeon: Shelva Dunnings, MD;  Location: [facility] ORS;  Service: General;  Laterality: Right;   IRRIGATION AND DEBRIDEMENT ABSCESS Left 04/29/2017   Procedure: IRRIGATION AND DEBRIDEMENT Scrotal ABSCESS;  Surgeon: Shelva Dunnings, MD;  Location: [facility] ORS;  Service: General;  Laterality: Left;   LEFT HEART CATH AND CORONARY ANGIOGRAPHY N/A 11/05/2023   Procedure: LEFT HEART CATH AND CORONARY ANGIOGRAPHY;  Surgeon: Mady Bruckner, MD;  Location: [facility] INVASIVE CV LAB;  Service: Cardiovascular;  Laterality: N/A;   POLYPECTOMY  07/01/2023   Procedure: POLYPECTOMY;  Surgeon: Therisa Bi, MD;  Location: [facility] ENDOSCOPY;  Service: Gastroenterology;;   SPINE SURGERY     Patient Active Problem List   Diagnosis Date Noted   B12 deficiency 07/28/2024   Vitamin D  deficiency 07/28/2024   Cervical radicular pain 05/26/2024   History of thoracic spinal fusion (T7-T12) 05/26/2024   History of lumbar spinal fusion (L5-S1) 05/26/2024   Chronic pain syndrome 05/26/2024   Anxiety and depression 02/26/2024   Dyslipidemia 02/26/2024   Peripheral neuropathy 02/26/2024   Essential hypertension 02/26/2024   Vertigo 02/26/2024   Orthostatic hypotension 02/11/2024   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) 01/20/2024   Anxiety 12/14/2023   Overweight (BMI 25.0-29.9) 11/23/2023   Chronic heart failure with preserved ejection fraction (HFpEF) (HCC) 11/23/2023   BPH (benign prostatic hyperplasia) 11/23/2023   Unstable angina (HCC) 11/10/2023   Closed T10 spinal fracture (HCC) 11/06/2023   Closed tricolumnar fracture of thoracic vertebra (HCC) 11/06/2023   Thoracic spine instability 11/06/2023   Chronic bilateral low back pain with left-sided sciatica 07/07/2023   Neuropathy 07/07/2023   Hx of colonic polyps 07/01/2023   Adenomatous polyp of colon 07/01/2023   Erectile disorder 01/22/2023   Coronary artery disease involving native  coronary artery of native heart without angina pectoris 09/09/2022   Varicose 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 01/03/2022   Immunocompromised state due to drug therapy 09/18/2021   Prediabetes 11/08/2020   Stage 3a chronic kidney disease (HCC) 11/08/2020   Seasonal allergies 11/08/2020   Rhinosinusitis 11/08/2020   Anticoagulant disorder 11/07/2020   Senile purpura 11/07/2020   MDD (major depressive disorder), recurrent episode, mild 09/12/2019   Other spondylosis with radiculopathy, lumbar region 02/16/2019   Osteoporosis 10/12/2018   Chronic venous insufficiency 10/07/2018   Lymphedema 10/07/2018   SLE glomerulonephritis syndrome, 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HCC) 03/03/2018   Syncope and collapse 12/29/2017   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) 10/29/2016   Hyperlipidemia LDL goal <70 01/15/2016   Obesity (BMI 30.0-34.9) 01/15/2016   Long term current use of anticoagulant 10/04/2015   Systemic lupus erythematosus (HCC) 05/30/2015   COPD, moderate (HCC) 06/27/2014   History of pulmonary embolism 12/11/2013   Nodule of right lung 12/11/2013   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   Cystic disease of liver 08/21/2013   ONSET DATE:  November 2024   REFERRING DIAG: imbalance  THERAPY DIAG:  Syncope due to orthostatic hypotension  Unsteadiness on feet  Rationale for Evaluation and Treatment: Rehabilitation  SUBJECTIVE:                                                                                                                                                                                            SUBJECTIVE STATEMENT: Pt really struggling today on arrival, he reports significant flare in neck and shoulder pain, having paresthesias in LUE, stinging in Right hand. No clear provoking factors.   PERTINENT HISTORY:   62yoM referred to OPPT for imbalance. Pt just finished OPPT for post surgical neck pain on 08/30/24  at [facility] so he could begin his balance therapy here. Pt has had issues with chronic orthostatic hypotension repeated syncope, fluctuating intolerance to upright. Pt has become quite good at managing his safety at home with symptoms fluctuations. PT checks BP daily, has PRN meds for excessively low or high pressures.   PAIN:  Are you having pain? 7/10  neck/shoulders; 7/10 low back   PRECAUTIONS: Fall  WEIGHT BEARING RESTRICTIONS: No  FALLS: Has patient fallen in last 6 months? Yes 1x in March 2025  LIVING ENVIRONMENT: Lives with: Alone  Lives in: 1 level  Stairs: threshold only Has following equipment at home: RW, canes, walking sticks.   PLOF: 2 years ago was able to go hiking for pleasure ab lib; used to get in and out kayak and do multi day treks overnight with kayak.   PATIENT GOALS: improve tolerance to upright mobility, stop falling as much   OBJECTIVE:  Note: Objective measures were completed at evaluation unless otherwise noted.   FUNCTIONAL TESTS:  TUG: 34sec (armless chair)  5xSTS: 54.9sec with BUE heavy use arm chair  : 14.63 Normal stance eyes closed: 9/22: 5-10sec for 3 bouts  Narrow stance eyes closed: 9/22: 17-18 sec for 3 bouts   PATIENT SURVEYS:  ABC Scale: 47.5%  TREATMENT DATE 10/03/24:  -heat applied to neck/back due to acute pain exacerbation today  -attempted manual cervical traction 25sec, limited by guarding, pain -towel roll traction in slight recliner x5 minutes -AMB overground with upwalker: 335ft in 91m46s   09/28/24 -overground AMB x624ft (increased today) upwalker: 75m46s, pain stable *seated rest -overground AMB x629ft (increased today) upwalker: 68m53s *seated rest   *AMB to wellzone, no device  -leg press, seat 12, 1x15, plate 4 (increased previous session) -standing marching x40 (20 per leg) BUE support  on leg press (increased reps today)  -standing heel raises x25, BUE support on leg press -leg press, seat 12, 1x15, plate 4 (increased previous session) -standing marching x40 (20 per leg) BUE support on leg press (increased reps today)  -standing heel raises x25, BUE support on leg press  *AMB back to rehab area  -Side stepping in // bars for motor control training x3 each -backward stepping // bars for motor control training x6 -forward stepup, retor step down on airex pad, hands-free when able (in // bars)   09/26/24 -419ft AMB overground c BRW (trial) 63m13s (pain not as aggravated as last session) -485ft AMB overground c upwalker (trial) 75m43s (pain in back/legs still fairly stable)   *AMB overground to wellzone  -Wellzone leg press: seat 12, plate 3 k84 -standing at leg press, double heel raise x20 (increased reps this time) -standing at leg press, alternate marching x15 each  -Wellzone leg press: seat 12, plate 4 k84 (increased this time) -standing at leg press, double heel raise x20 -standing at leg press, alternate marching x15 each  *AMB overground from wellzone back to rehab space  -airex pad stance x2 minutes, alternating eyes closed x3secH  09/21/24 454ft AMB, overground, no device, 103m53s -460ft AMB, overground, no device, 46m58s (more pain limited)  -airex pad stance x2 minutes, alternating eyes closed x3secH  -aitrex pad LUE ball toss/catch with 1kg ball x10, then 2kg ball x10 -airex pad LUE overhead rebounding off wall 1kg x15 -forward/backward stepping stones: floor to 2 step to foam pad to palstic step, then backwards (8x total) then 4 x lateral stepping *close minGuard assist   PATIENT EDUCATION: Education details: activity modification when neck is flared up  Person educated: Programmer, applications: Discussion, handout Education comprehension: Fair   HOME EXERCISE PROGRAM: Access Code: [email or web address] URL: [email or web address] Date:  09/12/2024 Prepared by: Massie Dollar  Exercises - sit to stand, arms crossed, 28-30 inch surface   - 3 x daily - 1 sets - 8 reps - Standing Near Stance in Corner with Eyes Closed  - 3 x daily - 1 sets - 5 reps - 30 hold - Semi-Tandem Balance at Counter Top Eyes Open  - 1 x daily - 7 x weekly - 3 sets - 4 reps - 15 hold - Standing March with Counter Support  - 1 x daily - 7 x weekly - 3 sets - 10 reps - Seated Long Arc Quad  - 1 x daily - 7 x weekly - 3 sets - 10 reps - Backward Walking with Counter Support  - 1 x daily - 7 x weekly - 3 sets - 5 reps  GOALS: Goals reviewed with patient? No  SHORT TERM GOALS: Target date: 10/07/24  Pt to report no falls in most recent 4 weeks to indicate good safety practices and symptoms management in the home.  Baseline: reports orthostatic s/s roughly 2x per week.  Goal status: INITIAL  2.  Pt will demonstrate improvement in  5xSTS performance by >3 seconds.  Baseline: 54.9sec with BUE heavy use arm chair  Goal status: INITIAL  3.  Pt to demonstrate improvement in by 20% and backwards by 25%  Baseline: 9/22: forward: 14.63 sec (0.6m/s). Backward: 49.04 sec 0.65m/s  Goal status: INITIAL  LONG TERM GOALS: Target date: 11/07/24  Pt to demonstrate tolerance of >1043ft sustained AMB c LRAD without presyncope, without LOB, and without increase in back pain >2/10 points NPRS.  Baseline: tolerated 377ft with mild-moderate increase in L hip and low back pain; 09/26/24: 443ft with Upwalker, 65m53s (no increased pain in back/hip)  Goal status: PROGRESSING  2.  5xSTS performance, standard chair, hands free <20sec; hands free from 19.5 surface height in <12sec.   Baseline:  54.9sec with BUE heavy use arm chair  Goal status: INITIAL  3.  Eye closed in narrow stance x60sec (5x without LOB and without need for hand support).  Baseline: 4-10 sec  Goal status: INITIAL  ASSESSMENT:  CLINICAL IMPRESSION: Pt having more limitation today due to  insidious onset pain exacerbation. Pt has been in contact with pain management and neurosurgical group. Attempted continued work on overground AMB as tolerated, however pt's moving at 50% speed compared to prior session. Patient will benefit from skilled physical therapy intervention to reduce deficits and impairments identified in evaluation, in order to reduce pain, improve quality of life, and maximize activity tolerance for ADL, IADL, and leisure/fitness. Physical therapy will help pt achieve long and short term goals of care.    OBJECTIVE IMPAIRMENTS: Decreased knowledge of condition, decreased use of DME, decreased mobility, difficulty walking, decreased strength, decreased ROM. ACTIVITY LIMITATIONS: Lifting, standing, walking, squatting, transfers, locomotion level PARTICIPATION LIMITATIONS: Cleaning, laundry, interpersonal relationships, driving, yardwork, community activity.  PERSONAL FACTORS: Age, behavior pattern, education, past/current experiences, transportation, profession  are also affecting patient's functional outcome.  REHAB POTENTIAL: Good CLINICAL DECISION MAKING: Medium  EVALUATION COMPLEXITY: Moderate    PLAN:  PT FREQUENCY: 1-2x/week PT DURATION: 8 weeks PLANNED INTERVENTIONS: 97110-Therapeutic exercises, 97530- Therapeutic activity, 97112- Neuromuscular re-education, 97535- Self Care, [id number]- Manual therapy, [phone]- Gait training, [phone]- Electrical stimulation (unattended), [phone]- Electrical stimulation (manual), Patient/Family education, Balance training, Stair training, Cryotherapy, and Moist heat  PLAN FOR NEXT SESSION:  -conitnued leg press, overground balance training with wlaking sparingly due to easily provoked back issues -continue overground AMB of 4-5 laps with upwalker, start adding 2-3lb AW    8:14 AM, 10/03/24 Peggye JAYSON Linear, PT, DPT Physical Therapist - [facility]  Outpatient Physical Therapy- Main  Campus [phone]
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
## 2024-10-03 NOTE — Progress Notes (Unsigned)
 Cardiology Clinic Note   Date: 10/06/2024 ID: SHEROD CISSE, DOB 20-Jan-1961, MRN [id number]  Primary Cardiologist:  Lonni Hanson, MD  Chief Complaint   Jonathon Snow is a 63 y.o. male who presents to the clinic today for routine follow up.   Patient Profile   Jonathon Snow is followed by Dr. Hanson for the history outlined below.       Past medical history significant for: CAD. LHC 11/05/2023 (STEMI): Severe multivessel CAD.  He is a 80 to 90% distal LCx.  Occlusion of proximal RCA with bridging and left-to-right collaterals that could not be crossed suggesting chronic nature.  Moderate to severe but noncritical proximal/mid LAD and proximal ramus disease.  IVUS guided PCI with DES 3.0 x 30 mm to distal LCx.  Recommend triple therapy with aspirin , Plavix , Eliquis  x 1 month then discontinue aspirin  and continue Plavix  and Eliquis  x 12 months.  Consider functional study when patient has recovered from hospitalization to assess hemodynamic significance of LAD and ramus intermedius disease. Chronic diastolic heart failure. Echo 11/06/2023: EF 55 to 60%.  No RWMA.  Mild LVH.  Grade I DD.  Normal RV function.  Mild LVH.  Mildly elevated PA pressure.  No significant valvular abnormalities. Syncope.  Carotid ultrasound 02/26/2024: Mild calcified plaque of the ICA origins with velocity parameters indicative of <50% stenosis. Hypertension. Hyperlipidemia. Lipid panel 11/05/2023: LDL 119, HDL 59, TG 105, total 199. Chronic venous insufficiency. DVT/PE. S/p IVC filter. Venous ultrasound 02/03/2023: Nonocclusive DVT left popliteal vein.  No DVT right lower extremity. Venous ultrasound 01/18/2024: Areas of thrombus seen now involving the popliteal region but also nonocclusive in the common femoral vein and occlusive and mid femoral vein. 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 Varicose veins. COPD. SLE. CKD stage IIIb. T10 fracture. S/p ORIF of T10 fracture and posterior lateral  arthrodesis T7-T12 November 2024.  In summary, patient was first evaluated by Dr. Gollan on 09/20/2021 for palpitations and shortness of breath at the request of Michelene Cower, PA-C.  Echo performed prior to visit demonstrated EF 55 to 60%, no RWMA, Grade II DD, normal RV size/function, no significant valvular abnormalities.  Nuclear stress testing was a low risk study with no evidence of ischemia.  There were coronary artery calcifications in the LAD and LCx.  Patient presented to the ED on 11/05/2023 following a syncopal event at home.  Patient reported being in his usual state of health cooking at home when he suddenly felt lightheaded and fell backwards striking his head.  He believes he passed out for short period of time.  Upon EMS arrival patient was hypotensive and bleeding from his scalp.  EKG showed inferior ST segment elevation prompting activation of STEMI.  Upon arrival to ED bleeding from scalp laceration had stopped.  He denied chest pain but complained of mid back pain and increased shortness of breath from baseline as well as diaphoresis.  He underwent emergent head CT and cervical spine CT prior to being taken to the Cath Lab emergently where he underwent PCI with DES to distal LCx (further details above).  Patient had recurrent lightheadedness with hypotension and bradycardia suspicious for vasovagal episode.  He improved with saline bolus however complained of left lower abdominal pain and was noted to have new bruising along left lower quadrant/left flank.  It was suggested he undergo CT abdomen pelvis which revealed T10 fracture.  Neurosurgery was consulted and felt fracture was highly unstable and could lead to paralysis and recommended urgent surgical  stabilization.  He underwent ORIF of T10 fracture and posterior lateral arthrodesis T7-T12.  Patient was discharged on 11/12/2023. Patient's daughter contacted the office on 11/17/2023 with concerns for patient being alone, not eating and  feeling weak.  She requested Dr. Mady and nurse check on patient.  It was suggested she contact PCP to involve social services.   Patient presented to the ED on 11/23/2023 with vomiting, back pain, chest tightness, dizziness.  EKG with no acute changes.  Head CT negative for acute process.  CTA chest negative for PE or pneumonia.  Initial labs: Sodium 132, potassium 4.4, creatinine 1.72, BUN 18, WBC 6.3, hemoglobin 13.7, BNP 23.1.  Troponin negative x 2.  He was admitted for vertigo.  MRI brain was negative.  Meclizine  did not improve symptoms.  Vestibular physical therapy ruled out peripheral vertigo.  Orthostatic vitals before discharge were positive.  Amlodipine  and losartan  were discontinued.  Toprol  was continued with holding parameters.  Patient was discharged with midodrine  5 mg 3 times daily to keep blood pressure around 140.  Patient was discharged on 11/25/2023.   Patient was seen in follow up on 12/21/2023. He reported continued positional dizziness.He noted occasional, brief central chest tightness.  Midodrine  was increased to 10 mg three times a day.   Patient was seen by PCP on 01/18/2024 with complaints of left lower extremity edema.  Venous ultrasound revealed left lower extremity DVT as detailed above.  He was instructed by PCP to stop low-dose Eliquis  and begin Eliquis  starter pack.  Patient was evaluated by Dr. Mady on 01/20/2024 with complaints of increased chest pain described as tightness occurring with and without exertion.  Pain was not worsened with deep inspiration but more pronounced when lying on his left side.  He reported dyspnea with modest activity.  Patient had decided not to wear outpatient cardiac monitor.  He had not taking midodrine  as home SBP typically 120-130s.  He was hypertensive at the time of his visit and metoprolol  was increased.  CTA chest was negative for PE.  Patient was seen in the office by Dr. Mady on 02/10/2024 for follow-up after medication changes and testing.   He reported feeling better with some shortness of breath and episodes of vertigo with no frank lightheadedness or falls.  No further chest pain.  He had been seen by multiple providers since his visit in January and was noted to be hypertensive prompting the addition of losartan  and hydralazine .  He reported continued uncontrolled hypertension.  He had not taken any midodrine .  Losartan  was increased and he was instructed to continue hydralazine .  Patient presented to the ED on 02/25/2024 with syncopal episode in the shower.  He was found to have orthostatic hypotension in the ER.  Patient was admitted for further workup.  Radiological studies were unremarkable.  Cardiology was consulted.  There was concern for underlying neurologic condition leading to autonomic dysfunction and potential central vertigo.  Patient was encouraged to stay well-hydrated and use compression garments, particularly abdominal binder, for management of dizzy episodes.  Outpatient cardiac monitor was suggested.  No plan for inpatient ischemic workup.  Neurology reviewed all radiologic testing and found no clear causative issues for his symptoms.  It was recommended further outpatient workup with movement disorder specialist to determine underlying parkinsonian features leading to dysautonomia.  He was referred to EP for further evaluation.  Patient was seen in the office by me on 04/04/2024 for hospital follow-up.  He reported continued episodes of presyncope 2-3 times a  week that he was managing with sitting/lying down when he felt the episode coming on.  He was wearing compression sleeves up to his thighs but had not tried abdominal binding at that point.  He had an upcoming visit with neurology but was uncertain if it was with a movement disorder specialist.  He reported chest tightness he attributed to reflux as he had been taken off of omeprazole.  He was delayed in placing outpatient monitor secondary to his neighbor getting his mail  and not dropping it off when he got back from the hospital.  He was instructed to place monitor.  Follow-up with EP was moved so monitor results could be reviewed at that time.  He was instructed to stop omeprazole and Protonix  was added.  He was also instructed to add abdominal compression for his near syncope.  Patient was evaluated by Dr. Kennyth on 05/03/2024.  Monitor results were reviewed.  Patient was instructed to continue supportive measures with compression and adequate sodium/fluid intake.  He was instructed to follow-up with EP as needed.     History of Present Illness    Today, patient reports dizziness occurs less than previous. He has vertigo symptoms with rolling over in bed unchanged from previous. He is working with vestibular PT to help manage symptoms and improve balance.  He reports chronic, stable shortness of breath 24/7. He states he experiences stabbing left sided chest pain that lasts seconds and resolves on its own mostly at night when getting ready for bed. It does not happen every night but can occur several times in one night. Patient's activity is limited by neck pain as well as dizziness. He is independent with hygiene and ADLs.   He is being treated by pain management for neck pain. He has chronic neck pain with limited range of motion of upper extremities and numbness in fingers bilaterally. He wants to get injections in his cervical spine to eventually undergo cervical nerve block. Dr. Charolotte office reached out to our office regarding holding Plavix . Per Dr. Mady Though not ideal given that Mr. Lepore is within 12 months of his STEMI and PCI to the LCx, if he must undergo invasive procedures requiring cessation of anticoagulation/antiplatelet therapy, I am okay with holding clopidogrel  for 5-7 days before the procedure.  Aspirin  81 mg daily should be taken during the periprocedural period while Mr. Carmen is off clopidogrel .  Given history of recurrent VTE, decisions  regarding periprocedural anticoagulation with apixaban  (and need for bridging) is deferred to vascular surgery and hematology.       ROS: All other systems reviewed and are otherwise negative except as noted in History of Present Illness.  EKGs/Labs Reviewed    EKG Interpretation Date/Time:  Thursday October 06 2024 08:06:01 EDT Ventricular Rate:  69 PR Interval:  164 QRS Duration:  90 QT Interval:  368 QTC Calculation: 394 R Axis:   51  Text Interpretation: Normal sinus rhythm Normal ECG When compared with ECG of 03-May-2024 07:59, No significant change was found Confirmed by Loistine Sober [id number]) on 10/06/2024 8:08:39 AM   07/28/2024: ALT 9; AST 11; BUN 24; Creat 1.59; Potassium 4.1; Sodium 137   07/28/2024: Hemoglobin 13.3; WBC 4.7   10/28/2023: TSH 1.06   11/23/2023: B Natriuretic Peptide 23.1       Physical Exam    VS:  BP 128/80 (BP Location: Left Arm, Patient Position: Sitting, Cuff Size: Large)   Pulse 69   Ht 6' 2 (1.88 m)   Wt  218 lb 6 oz (99.1 kg)   SpO2 97%   BMI 28.04 kg/m  , BMI Body mass index is 28.04 kg/m.  GEN: Well nourished, well developed, in no acute distress. Neck: No JVD or carotid bruits. Cardiac:  RRR.  No murmur. No rubs or gallops.   Respiratory:  Respirations regular and unlabored. Clear to auscultation without rales, wheezing or rhonchi. GI: Soft, nontender, nondistended. Extremities: Radials/DP/PT 2+ and equal bilaterally. No clubbing or cyanosis. No edema.  Skin: Warm and dry, no rash. Neuro: Strength intact.  Assessment & Plan   CAD S/p PCI with DES to distal LCx November 2024.  CTO proximal RCA with bridging and left to right collaterals, moderate to severe but noncritical proximal/mid LAD and proximal ramus disease.  Patient reports stabbing left sided chest pain that lasts seconds and resolves on its own typically occurring at night. He does not have discomfort daily but can experience the stabbing sensation several times in  one night.  - Continue Plavix , Eliquis , Toprol , rosuvastatin . - Schedule PET stress.  - CBC and BMP today.    Chronic diastolic heart failure Echo November 2024 EF 55-60%, mild LVH, grade I DD, mildly elevated PA pressure. Patient reports chronic dyspnea 24/7 unchanged from previous. No lower extremity edema.  Euvolemic and well compensated on exam.  -GDMT limited secondary to orthostatic hypotension. -Continue Toprol , losartan , hydralazine .   Orthostasis/Dizziness/near syncope/syncope Hospital admission 02/25/2024 for syncopal event getting out of the shower.  Concern for underlying neurologic condition causing autonomic dysfunction and possible central vertigo.  Patient reports dizzy spells less frequent than previous. He still gets vertigo with rolling over in bed. BP today 128/80.  - Continue managing symptoms with compression and adequate hydration.   Hyperlipidemia LDL 119 November 2024, not at goal. -Continue rosuvastatin .  Neck pain/pending cervical injections Patient is independent with personal hygiene and ADLs. His activity is otherwise limited secondary to pain and dizziness. He is working with vestibular PT to manage dizziness and improve balance. He is pending injections for neck pain but will need to hold Plavix  and Eliquis . Per Dr. Mady Though not ideal given that Mr. Pechacek is within 12 months of his STEMI and PCI to the LCx, if he must undergo invasive procedures requiring cessation of anticoagulation/antiplatelet therapy, I am okay with holding clopidogrel  for 5-7 days before the procedure.  Aspirin  81 mg daily should be taken during the periprocedural period while Mr. Beckers is off clopidogrel . Eliquis  is prescribed by hematology therefore recommendations for holding deferred to prescribing provider.    Disposition: CBC and BMP today. Schedule PET stress. Return in 6 months or sooner as needed.      Informed Consent   Shared Decision Making/Informed Consent The risks  [chest pain, shortness of breath, cardiac arrhythmias, dizziness, blood pressure fluctuations, myocardial infarction, stroke/transient ischemic attack, nausea, vomiting, allergic reaction, radiation exposure, metallic taste sensation and life-threatening complications (estimated to be 1 in 10,000)], benefits (risk stratification, diagnosing coronary artery disease, treatment guidance) and alternatives of a cardiac PET stress test were discussed in detail with Mr. Mizuno and he agrees to proceed.      Signed, Barnie HERO. Leiani Enright, DNP, NP-C

## 2024-10-04 ENCOUNTER — Ambulatory Visit

## 2024-10-05 ENCOUNTER — Ambulatory Visit: Admitting: Physical Therapy

## 2024-10-05 ENCOUNTER — Encounter: Payer: Self-pay | Admitting: Family Medicine

## 2024-10-05 ENCOUNTER — Other Ambulatory Visit: Payer: Self-pay

## 2024-10-05 ENCOUNTER — Ambulatory Visit

## 2024-10-05 DIAGNOSIS — M5459 Other low back pain: Secondary | ICD-10-CM

## 2024-10-05 DIAGNOSIS — M542 Cervicalgia: Secondary | ICD-10-CM

## 2024-10-05 DIAGNOSIS — I2510 Atherosclerotic heart disease of native coronary artery without angina pectoris: Secondary | ICD-10-CM

## 2024-10-05 DIAGNOSIS — R262 Difficulty in walking, not elsewhere classified: Secondary | ICD-10-CM

## 2024-10-05 DIAGNOSIS — R42 Dizziness and giddiness: Secondary | ICD-10-CM | POA: Diagnosis not present

## 2024-10-05 DIAGNOSIS — E782 Mixed hyperlipidemia: Secondary | ICD-10-CM

## 2024-10-05 DIAGNOSIS — I951 Orthostatic hypotension: Secondary | ICD-10-CM

## 2024-10-05 DIAGNOSIS — M5412 Radiculopathy, cervical region: Secondary | ICD-10-CM | POA: Diagnosis not present

## 2024-10-05 DIAGNOSIS — M5416 Radiculopathy, lumbar region: Secondary | ICD-10-CM

## 2024-10-05 DIAGNOSIS — R2681 Unsteadiness on feet: Secondary | ICD-10-CM

## 2024-10-05 DIAGNOSIS — M1652 Unilateral post-traumatic osteoarthritis, left hip: Secondary | ICD-10-CM | POA: Diagnosis not present

## 2024-10-05 DIAGNOSIS — I890 Lymphedema, not elsewhere classified: Secondary | ICD-10-CM | POA: Diagnosis not present

## 2024-10-05 MED ORDER — ROSUVASTATIN CALCIUM 20 MG PO TABS: 20.0000 mg | ORAL_TABLET | Freq: Every day | ORAL | 0 refills | Status: DC

## 2024-10-05 NOTE — Therapy (Signed)
 OUTPATIENT PHYSICAL THERAPY TREATMENT  Patient Name: Jonathon Snow MRN: [id number] DOB:Oct 14, 1961, 63 y.o., male Today's Date: 10/05/2024  PCP: [name], PA-C REFERRING PROVIDER: Maree Jannett POUR, MD  END OF SESSION:  PT End of Session - 10/05/24 1020     Visit Number 9    Number of Visits 16    Date for Recertification  11/02/24    Authorization Type [place] Medicaid    Authorization Time Period 09/07/24-11/02/24    Progress Note Due on Visit 10    PT Start Time 1020    PT Stop Time 1100    PT Time Calculation (min) 40 min    Activity Tolerance Patient tolerated treatment well;No increased pain;Patient limited by pain    Behavior During Therapy [facility] for tasks assessed/performed          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   Overview:  superior sagittal sinus, left transverse sinus and cortical veins    Closed left hip fracture, initial encounter (HCC) 02/18/2023   COPD (chronic obstructive pulmonary disease) (HCC)    COVID-19 virus infection 12/2020   Depression    DVT (deep venous thrombosis) (HCC)    Dyspnea    GERD (gastroesophageal reflux disease)    Head injury 11/05/2023   Heart attack (HCC) 11/05/2023   Heel spur, left 02/16/2019   Heel spur, right 02/16/2019   Hematoma of groin 11/08/2023   Herpes zoster infection of lumbar region 02/20/2020   History of DVT (deep vein thrombosis) 03/22/2020   History of kidney stones    HTN (hypertension)    Hyperlipidemia    Hypertension    Long term current use of systemic steroids 11/08/2020   Lupus    Lymphedema 10/07/2018   Morbid obesity (HCC)    Opiate abuse, episodic (HCC) 02/26/2018   Osteoporosis    Pain and swelling of right lower extremity 02/03/2023    Pneumonia    PONV (postoperative nausea and vomiting)    Postphlebitic syndrome with ulcer, 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   Presence of IVC filter 03/22/2020   Removed   Pulmonary embolism (HCC)    Renal disorder    Stage III   Severe episode of recurrent major depressive disorder, 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   Shock circulatory (HCC) 11/06/2023   Sleep apnea    STEMI (ST elevation myocardial infarction) (HCC) 11/05/2023   Past Surgical History:  Procedure Laterality Date   ANKLE SURGERY Right    BACK SURGERY     BRONCHIAL WASHINGS N/A 11/01/2021   Procedure: BRONCHIAL WASHINGS;  Surgeon: Parris Manna, MD;  Location: [facility] ORS;  Service: Thoracic;  Laterality: N/A;   COLONOSCOPY WITH PROPOFOL  N/A 05/28/2020   Procedure: COLONOSCOPY WITH PROPOFOL ;  Surgeon: Therisa Bi, MD;  Location: [facility] ENDOSCOPY;  Service: Endoscopy;  Laterality: N/A;   COLONOSCOPY WITH PROPOFOL  N/A 07/01/2023   Procedure: COLONOSCOPY WITH PROPOFOL ;  Surgeon: Therisa Bi, MD;  Location[facility] ENDOSCOPY;  Service: Gastroenterology;  Laterality: N/A;   CORONARY ULTRASOUND/IVUS N/A 11/05/2023   Procedure: Coronary Ultrasound/IVUS;  Surgeon: Mady Bruckner, MD;  Location: [facility] INVASIVE CV LAB;  Service: Cardiovascular;  Laterality: N/A;   CORONARY/GRAFT ACUTE MI REVASCULARIZATION N/A 11/05/2023   Procedure: Coronary/Graft Acute MI Revascularization;  Surgeon: Mady Bruckner, MD;  Location: [facility] INVASIVE CV LAB;  Service: Cardiovascular;  Laterality: N/A;   CYST EXCISION  92 or 93    Liver cyst removal [facility]   FLEXIBLE BRONCHOSCOPY N/A 11/01/2021   Procedure: FLEXIBLE BRONCHOSCOPY;  Surgeon: Parris Manna, MD;  Location: [facility] ORS;  Service: Thoracic;  Laterality: N/A;   FRACTURE SURGERY     HIP PINNING,CANNULATED Left 02/19/2023   Procedure: PERCUTANEOUS FIXATION OF FEMORAL NECK;  Surgeon: Tobie Priest, MD;  Location: [facility] ORS;  Service: Orthopedics;  Laterality: Left;   I & D EXTREMITY Right 04/29/2017    Procedure: IRRIGATION AND DEBRIDEMENT EXTREMITY;  Surgeon: Shelva Dunnings, MD;  Location: [facility] ORS;  Service: General;  Laterality: Right;   IRRIGATION AND DEBRIDEMENT ABSCESS Left 04/29/2017   Procedure: IRRIGATION AND DEBRIDEMENT Scrotal ABSCESS;  Surgeon: Shelva Dunnings, MD;  Location: [facility] ORS;  Service: General;  Laterality: Left;   LEFT HEART CATH AND CORONARY ANGIOGRAPHY N/A 11/05/2023   Procedure: LEFT HEART CATH AND CORONARY ANGIOGRAPHY;  Surgeon: Mady Bruckner, MD;  Location: [facility] INVASIVE CV LAB;  Service: Cardiovascular;  Laterality: N/A;   POLYPECTOMY  07/01/2023   Procedure: POLYPECTOMY;  Surgeon: Therisa Bi, MD;  Location: [facility] ENDOSCOPY;  Service: Gastroenterology;;   SPINE SURGERY     Patient Active Problem List   Diagnosis Date Noted   B12 deficiency 07/28/2024   Vitamin D  deficiency 07/28/2024   Cervical radicular pain 05/26/2024   History of thoracic spinal fusion (T7-T12) 05/26/2024   History of lumbar spinal fusion (L5-S1) 05/26/2024   Chronic pain syndrome 05/26/2024   Anxiety and depression 02/26/2024   Dyslipidemia 02/26/2024   Peripheral neuropathy 02/26/2024   Essential hypertension 02/26/2024   Vertigo 02/26/2024   Orthostatic hypotension 02/11/2024   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) 01/20/2024   Anxiety 12/14/2023   Overweight (BMI 25.0-29.9) 11/23/2023   Chronic heart failure with preserved ejection fraction (HFpEF) (HCC) 11/23/2023   BPH (benign prostatic hyperplasia) 11/23/2023   Unstable angina (HCC) 11/10/2023   Closed T10 spinal fracture (HCC) 11/06/2023   Closed tricolumnar fracture of thoracic vertebra (HCC) 11/06/2023   Thoracic spine instability 11/06/2023   Chronic bilateral low back pain with left-sided sciatica 07/07/2023   Neuropathy 07/07/2023   Hx of colonic polyps 07/01/2023   Adenomatous polyp of colon 07/01/2023   Erectile disorder 01/22/2023   Coronary artery disease involving native  coronary artery of native heart without angina pectoris 09/09/2022   Varicose 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 01/03/2022   Immunocompromised state due to drug therapy 09/18/2021   Prediabetes 11/08/2020   Stage 3a chronic kidney disease (HCC) 11/08/2020   Seasonal allergies 11/08/2020   Rhinosinusitis 11/08/2020   Anticoagulant disorder 11/07/2020   Senile purpura 11/07/2020   MDD (major depressive disorder), recurrent episode, mild 09/12/2019   Other spondylosis with radiculopathy, lumbar region 02/16/2019   Osteoporosis 10/12/2018   Chronic venous insufficiency 10/07/2018   Lymphedema 10/07/2018   SLE glomerulonephritis syndrome, 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HCC) 03/03/2018   Syncope and collapse 12/29/2017   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) 10/29/2016   Hyperlipidemia LDL goal <70 01/15/2016   Obesity (BMI 30.0-34.9) 01/15/2016   Long term current use of anticoagulant 10/04/2015   Systemic lupus erythematosus (HCC) 05/30/2015   COPD, moderate (HCC) 06/27/2014   History of pulmonary embolism 12/11/2013   Nodule of right lung 12/11/2013   Cerebral venous sinus thrombosis 08/21/2013   Cystic disease of liver 08/21/2013   ONSET  DATE: November 2024   REFERRING DIAG: imbalance  THERAPY DIAG:  Syncope due to orthostatic hypotension  Unsteadiness on feet  Cervicalgia  Radiculopathy, cervical region  Other low back pain  Difficulty in walking, not elsewhere classified  Radiculopathy, lumbar region  Dizziness and giddiness  Lymphedema, not elsewhere classified  Rationale for Evaluation and Treatment: Rehabilitation  SUBJECTIVE:                                                                                                                                                                                            SUBJECTIVE STATEMENT:  Pt reports that he saw the orthopaedic surgeon, and they are wanting to  replace the hip.  Pt notes he feels that he needs to get stronger and fix some other areas before he considers having that done.    PERTINENT HISTORY:   62yoM referred to OPPT for imbalance. Pt just finished OPPT for post surgical neck pain on 08/30/24 at [facility] so he could begin his balance therapy here. Pt has had issues with chronic orthostatic hypotension repeated syncope, fluctuating intolerance to upright. Pt has become quite good at managing his safety at home with symptoms fluctuations. PT checks BP daily, has PRN meds for excessively low or high pressures.   PAIN:  Are you having pain? 7/10  neck/shoulders; 7/10 low back   PRECAUTIONS: Fall  WEIGHT BEARING RESTRICTIONS: No  FALLS: Has patient fallen in last 6 months? Yes 1x in March 2025  LIVING ENVIRONMENT: Lives with: Alone  Lives in: 1 level  Stairs: threshold only Has following equipment at home: RW, canes, walking sticks.   PLOF: 2 years ago was able to go hiking for pleasure ab lib; used to get in and out kayak and do multi day treks overnight with kayak.   PATIENT GOALS: improve tolerance to upright mobility, stop falling as much   OBJECTIVE:  Note: Objective measures were completed at evaluation unless otherwise noted.   FUNCTIONAL TESTS:  TUG: 34sec (armless chair)  5xSTS: 54.9sec with BUE heavy use arm chair  : 14.63 Normal stance eyes closed: 9/22: 5-10sec for 3 bouts  Narrow stance eyes closed: 9/22: 17-18 sec for 3 bouts   PATIENT SURVEYS:  ABC Scale: 47.5%  TREATMENT DATE 10/05/24:    TherAct:   The patient completed 10 minutes at level(s) 2-6 on the NuStep rolling hills using both BUE/BLE reciprocal movements to promote strength, endurance, and cardiorespiratory fitness. PT increased the resistance level and monitored the patient's response to the intervention throughout.  The patient required min cueing for technique and supervision level of assistance.  MHP utilized on the low back and the cervical region for the final 4 minutes to pain modulation  Ambulation with upright walker around the gym, EVS, and down to cafeteria before returning to the gym, ~500'   TherEx: To improve strength, endurance, mobility, and function of specific targeted muscle groups or improve joint range of motion or improve muscle flexibility  All supine exercises performed with MHP applied to the low back and the neck/shoulder area:  Hooklying LTR for improved hip and low back ROM within pain tolerable ranges, x10 each side  Hooklying glute bridges, with focus on eccentric lowering 2x10  Hooklying heel digs into mat  for hamstring activation with bolster support under the knees, 2x10 each LE    10/03/24 -heat applied to neck/back due to acute pain exacerbation today  -attempted manual cervical traction 25sec, limited by guarding, pain -towel roll traction in slight recliner x5 minutes -AMB overground with upwalker: 331ft in 47m46s   09/28/24 -overground AMB x665ft (increased today) upwalker: 11m46s, pain stable *seated rest -overground AMB x618ft (increased today) upwalker: 51m53s *seated rest   *AMB to wellzone, no device  -leg press, seat 12, 1x15, plate 4 (increased previous session) -standing marching x40 (20 per leg) BUE support on leg press (increased reps today)  -standing heel raises x25, BUE support on leg press -leg press, seat 12, 1x15, plate 4 (increased previous session) -standing marching x40 (20 per leg) BUE support on leg press (increased reps today)  -standing heel raises x25, BUE support on leg press  *AMB back to rehab area  -Side stepping in // bars for motor control training x3 each -backward stepping // bars for motor control training x6 -forward stepup, retor step down on airex pad, hands-free when able (in // bars)   PATIENT EDUCATION: Education  details: activity modification when neck is flared up  Person educated: Firefighter method: Discussion, handout Education comprehension: Fair   HOME EXERCISE PROGRAM: Access Code: [email or web address] URL: [email or web address] Date: 09/12/2024 Prepared by: Massie Dollar  Exercises - sit to stand, arms crossed, 28-30 inch surface   - 3 x daily - 1 sets - 8 reps - Standing Near Stance in Corner with Eyes Closed  - 3 x daily - 1 sets - 5 reps - 30 hold - Semi-Tandem Balance at Counter Top Eyes Open  - 1 x daily - 7 x weekly - 3 sets - 4 reps - 15 hold - Standing March with Counter Support  - 1 x daily - 7 x weekly - 3 sets - 10 reps - Seated Long Arc Quad  - 1 x daily - 7 x weekly - 3 sets - 10 reps - Backward Walking with Counter Support  - 1 x daily - 7 x weekly - 3 sets - 5 reps  GOALS: Goals reviewed with patient? No  SHORT TERM GOALS: Target date: 10/07/24  Pt to report no falls in most recent 4 weeks to indicate good safety practices and symptoms management in the home.  Baseline: reports orthostatic s/s roughly 2x per week.  Goal status: INITIAL  2.  Pt will demonstrate improvement in 5xSTS performance  by >3 seconds.  Baseline: 54.9sec with BUE heavy use arm chair  Goal status: INITIAL  3.  Pt to demonstrate improvement in by 20% and backwards by 25%  Baseline: 9/22: forward: 14.63 sec (0.58m/s). Backward: 49.04 sec 0.34m/s  Goal status: INITIAL  LONG TERM GOALS: Target date: 11/07/24  Pt to demonstrate tolerance of >1073ft sustained AMB c LRAD without presyncope, without LOB, and without increase in back pain >2/10 points NPRS.  Baseline: tolerated 352ft with mild-moderate increase in L hip and low back pain; 09/26/24: 455ft with Upwalker, 59m53s (no increased pain in back/hip)  Goal status: PROGRESSING  2.  5xSTS performance, standard chair, hands free <20sec; hands free from 19.5 surface height in <12sec.   Baseline:  54.9sec with BUE heavy use arm  chair  Goal status: INITIAL  3.  Eye closed in narrow stance x60sec (5x without LOB and without need for hand support).  Baseline: 4-10 sec  Goal status: INITIAL  ASSESSMENT:  CLINICAL IMPRESSION:  Pt still limited slightly due to the pain that he is presenting with.  Pt responded well to the supine exercises with the concurrent heat.  Pt noted it to feel better at the conclusion of the session, but still limiting his gait and mechanics during ambulation.  Pt was able to ambulate a further distance as with with the use of the upright walker and will continue to improve in his overall endurance.   Pt will continue to benefit from skilled therapy to address remaining deficits in order to improve overall QoL and return to PLOF.     OBJECTIVE IMPAIRMENTS: Decreased knowledge of condition, decreased use of DME, decreased mobility, difficulty walking, decreased strength, decreased ROM. ACTIVITY LIMITATIONS: Lifting, standing, walking, squatting, transfers, locomotion level PARTICIPATION LIMITATIONS: Cleaning, laundry, interpersonal relationships, driving, yardwork, community activity.  PERSONAL FACTORS: Age, behavior pattern, education, past/current experiences, transportation, profession  are also affecting patient's functional outcome.  REHAB POTENTIAL: Good CLINICAL DECISION MAKING: Medium  EVALUATION COMPLEXITY: Moderate    PLAN:  PT FREQUENCY: 1-2x/week PT DURATION: 8 weeks PLANNED INTERVENTIONS: 97110-Therapeutic exercises, 97530- Therapeutic activity, 97112- Neuromuscular re-education, [phone]- Self Care, 02859- Manual therapy, [phone]- Gait training, [phone]- Electrical stimulation (unattended), [phone]- Electrical stimulation (manual), Patient/Family education, Balance training, Stair training, Cryotherapy, and Moist heat  PLAN FOR NEXT SESSION:    -continued leg press, overground balance training with wlaking sparingly due to easily provoked back issues -continue overground AMB of 4-5  laps with upwalker, start adding 2-3lb AW     Fonda Simpers, PT, DPT Physical Therapist - [facility]  10/05/24, 10:21 AM

## 2024-10-06 ENCOUNTER — Telehealth: Payer: Self-pay | Admitting: *Deleted

## 2024-10-06 ENCOUNTER — Ambulatory Visit: Attending: Student | Admitting: Student

## 2024-10-06 ENCOUNTER — Encounter: Payer: Self-pay | Admitting: Student

## 2024-10-06 ENCOUNTER — Encounter: Payer: Self-pay | Admitting: Nurse Practitioner

## 2024-10-06 ENCOUNTER — Encounter: Payer: Self-pay | Admitting: Student in an Organized Health Care Education/Training Program

## 2024-10-06 VITALS — BP 128/80 | HR 69 | Ht 74.0 in | Wt 218.4 lb

## 2024-10-06 DIAGNOSIS — R072 Precordial pain: Secondary | ICD-10-CM | POA: Diagnosis not present

## 2024-10-06 DIAGNOSIS — I951 Orthostatic hypotension: Secondary | ICD-10-CM | POA: Diagnosis not present

## 2024-10-06 DIAGNOSIS — R42 Dizziness and giddiness: Secondary | ICD-10-CM | POA: Insufficient documentation

## 2024-10-06 DIAGNOSIS — I2510 Atherosclerotic heart disease of native coronary artery without angina pectoris: Secondary | ICD-10-CM | POA: Diagnosis not present

## 2024-10-06 DIAGNOSIS — M542 Cervicalgia: Secondary | ICD-10-CM | POA: Insufficient documentation

## 2024-10-06 DIAGNOSIS — I10 Essential (primary) hypertension: Secondary | ICD-10-CM | POA: Insufficient documentation

## 2024-10-06 DIAGNOSIS — Z79899 Other long term (current) drug therapy: Secondary | ICD-10-CM | POA: Insufficient documentation

## 2024-10-06 DIAGNOSIS — E785 Hyperlipidemia, unspecified: Secondary | ICD-10-CM | POA: Insufficient documentation

## 2024-10-06 NOTE — Telephone Encounter (Unsigned)
 Contacted patient to get name of hematologist

## 2024-10-06 NOTE — Patient Instructions (Signed)
 Medication Instructions:   Your physician recommends that you continue on your current medications as directed. Please refer to the Current Medication list given to you today.    *If you need a refill on your cardiac medications before your next appointment, please call your pharmacy*  Lab Work:  Your provider would like for you to have following labs drawn today BMet, CBC.    If you have labs (blood work) drawn today and your tests are completely normal, you will receive your results only by:  MyChart Message (if you have MyChart) OR  A paper copy in the mail If you have any lab test that is abnormal or we need to change your treatment, we will call you to review the results.  Testing/Procedures:     Please report to Radiology at the [facility] Main Entrance 30 minutes early for your test.  [address] [place], [name] [address]                         OR   Please report to Radiology at [facility] Main Entrance, medical mall, 30 mins prior to your test.  [address]  [place], [name]  How to Prepare for Your Cardiac PET/CT Stress Test:  Nothing to eat or drink, except water , 3 hours prior to arrival time.  NO caffeine/decaffeinated products, or chocolate 12 hours prior to arrival. (Please note decaffeinated beverages (teas/coffees) still contain caffeine).  If you have caffeine within 12 hours prior, the test will need to be rescheduled.  Medication instructions: Do not take erectile dysfunction medications for 72 hours prior to test (sildenafil , tadalafil ) Do not take nitrates (isosorbide mononitrate, Ranexa) the day before or day of test Do not take tamsulosin  the day before or morning of test Hold theophylline containing medications for 12 hours. Hold Dipyridamole 48 hours prior to the test.  Diabetic Preparation: If able to eat breakfast prior to 3 hour fasting, you may take all medications, including your insulin . Do not  worry if you miss your breakfast dose of insulin  - start at your next meal. If you do not eat prior to 3 hour fast-Hold all diabetes (oral and insulin ) medications. Patients who wear a continuous glucose monitor MUST remove the device prior to scanning.  You may take your remaining medications with water .  NO perfume, cologne or lotion on chest or abdomen area. FEMALES - Please avoid wearing dresses to this appointment.  Total time is 1 to 2 hours; you may want to bring reading material for the waiting time.  IF YOU THINK YOU MAY BE PREGNANT, OR ARE NURSING PLEASE INFORM THE TECHNOLOGIST.  In preparation for your appointment, medication and supplies will be purchased.  Appointment availability is limited, so if you need to cancel or reschedule, please call the Radiology Department Scheduler at [phone] 24 hours in advance to avoid a cancellation fee of $100.00  What to Expect When you Arrive:  Once you arrive and check in for your appointment, you will be taken to a preparation room within the Radiology Department.  A technologist or Nurse will obtain your medical history, verify that you are correctly prepped for the exam, and explain the procedure.  Afterwards, an IV will be started in your arm and electrodes will be placed on your skin for EKG monitoring during the stress portion of the exam. Then you will be escorted to the PET/CT scanner.  There, staff will get  you positioned on the scanner and obtain a blood pressure and EKG.  During the exam, you will continue to be connected to the EKG and blood pressure machines.  A small, safe amount of a radioactive tracer will be injected in your IV to obtain a series of pictures of your heart along with an injection of a stress agent.    After your Exam:  It is recommended that you eat a meal and drink a caffeinated beverage to counter act any effects of the stress agent.  Drink plenty of fluids for the remainder of the day and urinate  frequently for the first couple of hours after the exam.  Your doctor will inform you of your test results within 7-10 business days.  For more information and frequently asked questions, please visit our website: [email or web address]  For questions about your test or how to prepare for your test, please call: Cardiac Imaging Nurse Navigators Office: [phone]   Referrals:  None ordered at this time   Follow-Up:  At [facility], you and your health needs are our priority.  As part of our continuing mission to provide you with exceptional heart care, our providers are all part of one team.  This team includes your primary Cardiologist (physician) and Advanced Practice Providers or APPs (Physician Assistants and Nurse Practitioners) who all work together to provide you with the care you need, when you need it.  Your next appointment:   6 month(s)  Provider:    You may see Lonni Hanson, MD or one of the following Advanced Practice Providers on your designated Care Team:   Lonni Meager, NP Lesley Maffucci, PA-C Bernardino Bring, PA-C Cadence [place], PA-C Tylene Lunch, NP Barnie Hila, NP    We recommend signing up for the patient portal called MyChart.  Sign up information is provided on this After Visit Summary.  MyChart is used to connect with patients for Virtual Visits (Telemedicine).  Patients are able to view lab/test results, encounter notes, upcoming appointments, etc.  Non-urgent messages can be sent to your provider as well.   To learn more about what you can do with MyChart, go to [email or web address].

## 2024-10-07 ENCOUNTER — Ambulatory Visit: Admitting: Physical Therapy

## 2024-10-07 ENCOUNTER — Ambulatory Visit: Payer: Self-pay | Admitting: Student

## 2024-10-07 LAB — BASIC METABOLIC PANEL WITH GFR
BUN/Creatinine Ratio: 15 (ref 10–24)
BUN: 23 mg/dL (ref 8–27)
CO2: 19 mmol/L — ABNORMAL LOW (ref 20–29)
Calcium: 9.5 mg/dL (ref 8.6–10.2)
Chloride: 103 mmol/L (ref 96–106)
Creatinine, Ser: 1.55 mg/dL — ABNORMAL HIGH (ref 0.76–1.27)
Glucose: 90 mg/dL (ref 70–99)
Potassium: 4.7 mmol/L (ref 3.5–5.2)
Sodium: 140 mmol/L (ref 134–144)
eGFR: 50 mL/min/1.73 — ABNORMAL LOW (ref >59)

## 2024-10-07 LAB — CBC
Hematocrit: 48.7 % (ref 37.5–51.0)
Hemoglobin: 15.1 g/dL (ref 13.0–17.7)
MCH: 29.1 pg (ref 26.6–33.0)
MCHC: 31 g/dL — ABNORMAL LOW (ref 31.5–35.7)
MCV: 94 fL (ref 79–97)
Platelets: 136 x10E3/uL — ABNORMAL LOW (ref 150–450)
RBC: 5.19 x10E6/uL (ref 4.14–5.80)
RDW: 15.5 % — ABNORMAL HIGH (ref 11.6–15.4)
WBC: 3.3 x10E3/uL — ABNORMAL LOW (ref 3.4–10.8)

## 2024-10-07 NOTE — Telephone Encounter (Signed)
 Called pharmacy to check status of Prescription. Pharmacy states that patient picked up medication today 10/07/2024.

## 2024-10-10 ENCOUNTER — Ambulatory Visit (INDEPENDENT_AMBULATORY_CARE_PROVIDER_SITE_OTHER): Payer: Self-pay | Admitting: Psychiatry

## 2024-10-10 ENCOUNTER — Telehealth: Payer: Self-pay

## 2024-10-10 ENCOUNTER — Other Ambulatory Visit: Payer: Self-pay

## 2024-10-10 ENCOUNTER — Encounter: Payer: Self-pay | Admitting: Psychiatry

## 2024-10-10 VITALS — BP 160/83 | HR 66 | Temp 97.5°F | Ht 74.0 in | Wt 219.8 lb

## 2024-10-10 DIAGNOSIS — F331 Major depressive disorder, recurrent, moderate: Secondary | ICD-10-CM | POA: Diagnosis not present

## 2024-10-10 DIAGNOSIS — F411 Generalized anxiety disorder: Secondary | ICD-10-CM | POA: Diagnosis not present

## 2024-10-10 MED ORDER — BUSPIRONE HCL 5 MG PO TABS: 5.0000 mg | ORAL_TABLET | Freq: Two times a day (BID) | ORAL | 0 refills | Status: DC

## 2024-10-10 NOTE — Progress Notes (Signed)
 Last read by [name] at 7:42AM on 10/07/2024.

## 2024-10-10 NOTE — Telephone Encounter (Signed)
   Pre-operative Risk Assessment    Patient Name: Jonathon Snow  DOB: 08/16/1961 MRN: [id number]   Date of last office visit: 10/06/24 BARNIE HILA, NP Date of next office visit: NONE   Request for Surgical Clearance    Procedure:  CERVICAL FACET BLOCK (ELECTIVE)  Date of Surgery:  Clearance TBD                                Surgeon:  DR KATHEE SHERRY Surgeon's Group or Practice Name:  [facility] Phone number:  [phone] Fax number:  [phone]  /  [phone]   Type of Clearance Requested:   - Medical  - Pharmacy:  Hold [facility]  3 DAYS  AND [facility]  7 DAYS   Type of Anesthesia:  MODERATE SEDATION (VERSED  POSSIBLY FENTANYL )   Additional requests/questions:    Signed, Lucie DELENA Ku   10/10/2024, 10:36 AM

## 2024-10-10 NOTE — Progress Notes (Deleted)
 Referring Physician:  [name], PA-C [address] [place],  [name] [address]  Primary Physician:  [name], PA-C  History of Present Illness: 10/10/2024*** [name] has a history of cerebral venous sinus thrombosis, COPD, hypercholesterolemia, hyperlipidemia, HTN, SLE, migraines, MI, history of DVT with IVC filter, lymphedema, obesity, osteoporosis, history of PE, OSA.   Last seen by Dr. Clois on 07/08/24 for follow up of T7-T12 PSF on 11/06/23. He had right shoulder pain with neck pain at that time. Was to see Dr. Marcelino for repeat cervical injections. He was sent to ortho for his right shoulder as well.   Looks like neck injection was not done and he has not see ortho.   He is here for follow up.     Duration: *** Location: *** Quality: *** Severity: ***  Precipitating: aggravated by *** Modifying factors: made better by *** Weakness: none Timing: ***  Tobacco use: smokes *** PPD x years. Does not smoke.   Bowel/Bladder Dysfunction: none  Conservative measures:  Physical therapy: ***  Multimodal medical therapy including regular antiinflammatories: ***  Injections:  Right C7-T1 IL ESI on 06/15/24 Jonathon Snow)  Past Surgery:  T7-T12 PSF on 11/06/23  Jonathon Snow has ***no symptoms of cervical myelopathy.  The symptoms are causing a significant impact on the patient's life.   Review of Systems:  A 10 point review of systems is negative, except for the pertinent positives and negatives detailed in the HPI.  Past Medical History: 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   Overview:  superior sagittal sinus, left transverse sinus and cortical veins    Closed left hip fracture, initial  encounter (HCC) 02/18/2023   COPD (chronic obstructive pulmonary disease) (HCC)    COVID-19 virus infection 12/2020   Depression    DVT (deep venous thrombosis) (HCC)    Dyspnea    GERD (gastroesophageal reflux disease)    Head injury 11/05/2023   Heart attack (HCC) 11/05/2023   Heel spur, left 02/16/2019   Heel spur, right 02/16/2019   Hematoma of groin 11/08/2023   Herpes zoster infection of lumbar region 02/20/2020   History of DVT (deep vein thrombosis) 03/22/2020   History of kidney stones    HTN (hypertension)    Hyperlipidemia    Hypertension    Long term current use of systemic steroids 11/08/2020   Lupus    Lymphedema 10/07/2018   Morbid obesity (HCC)    Opiate abuse, episodic (HCC) 02/26/2018   Osteoporosis    Pain and swelling of right lower extremity 02/03/2023   Pneumonia    PONV (postoperative nausea and vomiting)    Postphlebitic syndrome with ulcer, 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   Presence of IVC filter 03/22/2020   Removed   Pulmonary embolism (HCC)    Renal disorder    Stage III   Severe episode of recurrent major depressive disorder, 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   Shock circulatory (HCC) 11/06/2023   Sleep apnea    STEMI (ST elevation myocardial infarction) (HCC) 11/05/2023    Past Surgical History: Past Surgical History:  Procedure Laterality Date   ANKLE SURGERY Right    BACK SURGERY     BRONCHIAL WASHINGS N/A 11/01/2021   Procedure: BRONCHIAL WASHINGS;  Surgeon: Parris Manna, MD;  Location: [facility] ORS;  Service: Thoracic;  Laterality: N/A;   COLONOSCOPY WITH PROPOFOL  N/A 05/28/2020   Procedure: COLONOSCOPY WITH PROPOFOL ;  Surgeon: Therisa Bi, MD;  Location: [facility] ENDOSCOPY;  Service: Endoscopy;  Laterality: N/A;   COLONOSCOPY WITH PROPOFOL  N/A 07/01/2023   Procedure: COLONOSCOPY WITH PROPOFOL ;  Surgeon: Therisa Bi, MD;  Location: [facility] ENDOSCOPY;  Service: Gastroenterology;  Laterality: N/A;   CORONARY ULTRASOUND/IVUS N/A 11/05/2023    Procedure: Coronary Ultrasound/IVUS;  Surgeon: Mady Bruckner, MD;  Location: [facility] INVASIVE CV LAB;  Service: Cardiovascular;  Laterality: N/A;   CORONARY/GRAFT ACUTE MI REVASCULARIZATION N/A 11/05/2023   Procedure: Coronary/Graft Acute MI Revascularization;  Surgeon: Mady Bruckner, MD;  Location: [facility] INVASIVE CV LAB;  Service: Cardiovascular;  Laterality: N/A;   CYST EXCISION  92 or 93    Liver cyst removal [facility]   FLEXIBLE BRONCHOSCOPY N/A 11/01/2021   Procedure: FLEXIBLE BRONCHOSCOPY;  Surgeon: Parris Manna, MD;  Location: [facility] ORS;  Service: Thoracic;  Laterality: N/A;   FRACTURE SURGERY     HIP PINNING,CANNULATED Left 02/19/2023   Procedure: PERCUTANEOUS FIXATION OF FEMORAL NECK;  Surgeon: Tobie Priest, MD;  Location: [facility] ORS;  Service: Orthopedics;  Laterality: Left;   I & D EXTREMITY Right 04/29/2017   Procedure: IRRIGATION AND DEBRIDEMENT EXTREMITY;  Surgeon: Shelva Dunnings, MD;  Location: [facility] ORS;  Service: General;  Laterality: Right;   IRRIGATION AND DEBRIDEMENT ABSCESS Left 04/29/2017   Procedure: IRRIGATION AND DEBRIDEMENT Scrotal ABSCESS;  Surgeon: Shelva Dunnings, MD;  Location: [facility] ORS;  Service: General;  Laterality: Left;   LEFT HEART CATH AND CORONARY ANGIOGRAPHY N/A 11/05/2023   Procedure: LEFT HEART CATH AND CORONARY ANGIOGRAPHY;  Surgeon: Mady Bruckner, MD;  Location: [facility] INVASIVE CV LAB;  Service: Cardiovascular;  Laterality: N/A;   POLYPECTOMY  07/01/2023   Procedure: POLYPECTOMY;  Surgeon: Therisa Bi, MD;  Location: [facility] ENDOSCOPY;  Service: Gastroenterology;;   SPINE SURGERY      Allergies: Allergies as of 10/12/2024 - Review Complete 10/10/2024  Allergen Reaction Noted   Enalapril Other (See Comments) 07/31/2015   Vicodin [hydrocodone -acetaminophen ] Hives and Rash 12/09/2013    Medications: Outpatient Encounter Medications as of 10/12/2024  Medication Sig   albuterol  (VENTOLIN  HFA) 108 (90 Base) MCG/ACT inhaler Inhale 2 puffs into the lungs  every 6 (six) hours as needed for wheezing or shortness of breath.   apixaban  (ELIQUIS ) 5 MG TABS tablet Take 1 tablet (5 mg total) by mouth 2 (two) times daily.   ARIPiprazole  (ABILIFY ) 2 MG tablet Take 4 mg by mouth daily.   azelastine (ASTELIN) 0.1 % nasal spray 2 sprays 2 (two) times daily.   BENLYSTA 200 MG/ML SOSY  (Patient taking differently: once a week.)   busPIRone  (BUSPAR ) 5 MG tablet Take 1 tablet (5 mg total) by mouth 2 (two) times daily.   carbidopa-levodopa (SINEMET IR) 25-100 MG tablet Take by mouth.   cholecalciferol  (VITAMIN D3) 25 MCG (1000 UNIT) tablet Take 1,000 Units by mouth daily.   ciprofloxacin  (CILOXAN ) 0.3 % ophthalmic solution Place 2 drops into both eyes every 2 (two) hours. Administer 1 drop, every 2 hours, while awake, for 2 days. Then 1 drop, every 4 hours, while awake, for the next 5 days. (Patient not taking: Reported on 10/06/2024)   clonazePAM  (KLONOPIN ) 0.5 MG tablet Take 0.5 mg by mouth daily.   clopidogrel  (PLAVIX ) 75 MG tablet TAKE 1 TABLET(75 MG) BY MOUTH DAILY   cyanocobalamin  (VITAMIN B12) 1000 MCG/ML injection Vit B12 1000 mcg IM once a  week for 3 weeks then once a month for 4 months. During and after loading dose with injection treatment patient should also take 1000 mcg by mouth once a day.   famotidine  (PEPCID ) 20 MG tablet TAKE 1 TABLET(20 MG) BY MOUTH TWICE DAILY   fluticasone  (FLONASE ) 50 MCG/ACT nasal spray Place 2 sprays into both nostrils daily.   gabapentin  (NEURONTIN ) 300 MG capsule Take 1 capsule (300 mg total) by mouth 2 (two) times daily.   hydrALAZINE  (APRESOLINE ) 25 MG tablet Take 1 tablet (25 mg total) by mouth 3 (three) times daily.   HYDROcodone -acetaminophen  (NORCO/VICODIN) 5-325 MG tablet Take 1 tablet by mouth every 8 (eight) hours as needed for severe pain (pain score 7-10). Must last 30 days   hydroxychloroquine (PLAQUENIL) 200 MG tablet Take 200 mg by mouth 2 (two) times daily.   hydrOXYzine  (ATARAX ) 25 MG tablet Take 25-50 mg by  mouth at bedtime as needed.   loratadine  (CLARITIN ) 10 MG tablet Take 10 mg by mouth daily.   losartan  (COZAAR ) 25 MG tablet Take 25 mg by mouth daily.   meclizine  (ANTIVERT ) 12.5 MG tablet Take 1 tablet (12.5 mg total) by mouth 3 (three) times daily as needed for dizziness.   metoprolol  succinate (TOPROL -XL) 25 MG 24 hr tablet Take 1 tablet (25 mg total) by mouth daily.   midodrine  (PROAMATINE ) 10 MG tablet Take 10 mg by mouth 3 (three) times daily.   montelukast  (SINGULAIR ) 10 MG tablet Take 1 tablet (10 mg total) by mouth at bedtime.   Multiple Vitamins-Minerals (MULTIVITAMIN WITH MINERALS) tablet Take 1 tablet by mouth daily.   mycophenolate  (CELLCEPT ) 500 MG tablet Take 2 tablets (1,000 mg total) by mouth 2 (two) times daily. HOLD until neurosurgery is ok to restart this (waiting for incision to heal)   oxybutynin  (DITROPAN  XL) 15 MG 24 hr tablet Take 15 mg by mouth daily.   pantoprazole  (PROTONIX ) 20 MG tablet Take 1 tablet (20 mg total) by mouth 2 (two) times daily.   rosuvastatin  (CRESTOR ) 20 MG tablet Take 1 tablet (20 mg total) by mouth daily.   sertraline  (ZOLOFT ) 100 MG tablet Take 1.5 tablets (150 mg total) by mouth daily.   solifenacin  (VESICARE ) 5 MG tablet Take 5 mg by mouth daily.   Syringe/Needle, Disp, (SYRINGE 3CC/25GX1) 25G X 1 3 ML MISC To be used with Vit B12 1000 mcg IM once a week for 4 weeks then once a month for 4 months. During and after loading dose with injection treatment patient should also take 1000 mcg by mouth once a day.   tadalafil  (CIALIS ) 20 MG tablet Take 1 tablet (20 mg total) by mouth daily as needed for erectile dysfunction. Avoid taking it if low blood pressure   tamsulosin  (FLOMAX ) 0.4 MG CAPS capsule Take 1 capsule (0.4 mg total) by mouth daily.   No facility-administered encounter medications on file as of 10/12/2024.    Social History: Social History   Tobacco Use   Smoking status: Never   Smokeless tobacco: Never  Vaping Use   Vaping  status: Never Used  Substance Use Topics   Alcohol use: No   Drug use: No    Comment: PT DENIES    Family Medical History: Family History  Problem Relation Age of Onset   Hypertension Father    Heart disease Father    Cancer Father    Clotting disorder Mother    Hearing loss Mother    Kidney disease Brother    Heart attack Maternal Grandmother  Heart attack Maternal Grandfather    Heart attack Paternal Grandfather     Physical Examination: There were no vitals filed for this visit.    Awake, alert, oriented to person, place, and time.  Speech is clear and fluent. Fund of knowledge is appropriate.   Cranial Nerves: Pupils equal round and reactive to light.  Facial tone is symmetric.    *** ROM of cervical spine *** pain *** posterior cervical tenderness. *** tenderness in bilateral trapezial region.   *** ROM of lumbar spine *** pain *** posterior lumbar tenderness.   No abnormal lesions on exposed skin.   Strength: Side Biceps Triceps Deltoid Interossei Grip Wrist Ext. Wrist Flex.  R 5 5 5 5 5 5 5   L 5 5 5 5 5 5 5    Side Iliopsoas Quads Hamstring PF DF EHL  R 5 5 5 5 5 5   L 5 5 5 5 5 5    Reflexes are ***2+ and symmetric at the biceps, brachioradialis, patella and achilles.   Hoffman's is absent.  Clonus is not present.   Bilateral upper and lower extremity sensation is intact to light touch.     Gait is normal.   ***No difficulty with tandem gait.    Medical Decision Making  Imaging: Nothing recent.   Assessment and Plan: Mr. Truex is a pleasant 63 y.o. male has ***  Treatment options discussed with patient and following plan made:   - Order for physical therapy for *** spine ***. Patient to call to schedule appointment. *** - Continue current medications including ***. Reviewed dosing and side effects.  - Prescription for ***. Reviewed dosing and side effects. Take with food.  - Prescription for *** to take prn muscle spasms. Reviewed dosing and  side effects. Discussed this can cause drowsiness.  - MRI of *** to further evaluate *** radiculopathy. No improvement time or medications (***).  - Referral to PMR at [facility] to discuss possible *** injections.  - Will schedule phone visit to review MRI results once I get them back.   I spent a total of *** minutes in face-to-face and non-face-to-face activities related to this patient's care today including review of outside records, review of imaging, review of symptoms, physical exam, discussion of differential diagnosis, discussion of treatment options, and documentation.   Thank you for involving me in the care of this patient.   Glade Boys PA-C Dept. of Neurosurgery

## 2024-10-10 NOTE — Patient Instructions (Addendum)
 Continue sertraline  200 mg daily Continue buspar  5 mg twice a day  Continue abilify  4 mg daily  Continue clonazepam  0.5 mg daily as needed for anxiety  Referral to therapy  Next appointment- 12/4 at 10 am

## 2024-10-12 ENCOUNTER — Ambulatory Visit: Admitting: Orthopedic Surgery

## 2024-10-12 ENCOUNTER — Ambulatory Visit

## 2024-10-12 ENCOUNTER — Ambulatory Visit: Admitting: Physical Therapy

## 2024-10-12 ENCOUNTER — Encounter: Payer: Self-pay | Admitting: Orthopedic Surgery

## 2024-10-12 ENCOUNTER — Encounter (INDEPENDENT_AMBULATORY_CARE_PROVIDER_SITE_OTHER): Payer: Self-pay

## 2024-10-12 VITALS — BP 152/98 | Ht 74.0 in | Wt 219.0 lb

## 2024-10-12 DIAGNOSIS — M5416 Radiculopathy, lumbar region: Secondary | ICD-10-CM

## 2024-10-12 DIAGNOSIS — M25512 Pain in left shoulder: Secondary | ICD-10-CM | POA: Diagnosis not present

## 2024-10-12 DIAGNOSIS — R42 Dizziness and giddiness: Secondary | ICD-10-CM

## 2024-10-12 DIAGNOSIS — R262 Difficulty in walking, not elsewhere classified: Secondary | ICD-10-CM

## 2024-10-12 DIAGNOSIS — I951 Orthostatic hypotension: Secondary | ICD-10-CM

## 2024-10-12 DIAGNOSIS — M5412 Radiculopathy, cervical region: Secondary | ICD-10-CM

## 2024-10-12 DIAGNOSIS — G8929 Other chronic pain: Secondary | ICD-10-CM

## 2024-10-12 DIAGNOSIS — M542 Cervicalgia: Secondary | ICD-10-CM

## 2024-10-12 DIAGNOSIS — M4722 Other spondylosis with radiculopathy, cervical region: Secondary | ICD-10-CM | POA: Diagnosis not present

## 2024-10-12 DIAGNOSIS — M4802 Spinal stenosis, cervical region: Secondary | ICD-10-CM

## 2024-10-12 DIAGNOSIS — M25511 Pain in right shoulder: Secondary | ICD-10-CM

## 2024-10-12 DIAGNOSIS — I890 Lymphedema, not elsewhere classified: Secondary | ICD-10-CM | POA: Diagnosis not present

## 2024-10-12 DIAGNOSIS — M47812 Spondylosis without myelopathy or radiculopathy, cervical region: Secondary | ICD-10-CM

## 2024-10-12 DIAGNOSIS — M5459 Other low back pain: Secondary | ICD-10-CM

## 2024-10-12 DIAGNOSIS — R2681 Unsteadiness on feet: Secondary | ICD-10-CM | POA: Diagnosis not present

## 2024-10-12 NOTE — Therapy (Signed)
 OUTPATIENT PHYSICAL THERAPY TREATMENT/PHYSICAL THERAPY PROGRESS NOTE   Dates of reporting period  09/12/24   to   10/12/24   Patient Name: Jonathon Snow MRN: [id number] DOB:07-07-1961, 63 y.o., male Today's Date: 10/12/2024  PCP: [name], PA-C REFERRING PROVIDER: [name], PA-C  END OF SESSION:  PT End of Session - 10/12/24 0751     Visit Number 10    Number of Visits 16    Date for Recertification  11/02/24    Authorization Type [place] Medicaid    Authorization Time Period 09/07/24-11/02/24    Progress Note Due on Visit 10    PT Start Time 0800    PT Stop Time 0845    PT Time Calculation (min) 45 min    Activity Tolerance Patient tolerated treatment well;No increased pain;Patient limited by pain    Behavior During Therapy [facility] for tasks assessed/performed          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   Overview:  superior sagittal sinus, left transverse sinus and cortical veins    Closed left hip fracture, initial encounter (HCC) 02/18/2023   COPD (chronic obstructive pulmonary disease) (HCC)    COVID-19 virus infection 12/2020   Depression    DVT (deep venous thrombosis) (HCC)    Dyspnea    GERD (gastroesophageal reflux disease)    Head injury 11/05/2023   Heart attack (HCC) 11/05/2023   Heel spur, left 02/16/2019   Heel spur, right 02/16/2019   Hematoma of groin 11/08/2023   Herpes zoster infection of lumbar region 02/20/2020   History of DVT (deep vein thrombosis) 03/22/2020   History of kidney stones    HTN (hypertension)    Hyperlipidemia    Hypertension    Long term current use of systemic steroids 11/08/2020   Lupus    Lymphedema 10/07/2018   Morbid obesity (HCC)    Opiate abuse, episodic (HCC)  02/26/2018   Osteoporosis    Pain and swelling of right lower extremity 02/03/2023   Pneumonia    PONV (postoperative nausea and vomiting)    Postphlebitic syndrome with ulcer, 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   Presence of IVC filter 03/22/2020   Removed   Pulmonary embolism (HCC)    Renal disorder    Stage III   Severe episode of recurrent major depressive disorder, 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   Shock circulatory (HCC) 11/06/2023   Sleep apnea    STEMI (ST elevation myocardial infarction) (HCC) 11/05/2023   Past Surgical History:  Procedure Laterality Date   ANKLE SURGERY Right    BACK SURGERY     BRONCHIAL WASHINGS N/A 11/01/2021   Procedure: BRONCHIAL WASHINGS;  Surgeon: Parris Manna, MD;  Location: [facility] ORS;  Service: Thoracic;  Laterality: N/A;   COLONOSCOPY WITH PROPOFOL  N/A 05/28/2020   Procedure: COLONOSCOPY WITH PROPOFOL ;  Surgeon: Therisa Bi, MD;  Location: [facility] ENDOSCOPY;  Service: Endoscopy;  Laterality: N/A;   COLONOSCOPY WITH PROPOFOL  N/A 07/01/2023   Procedure: COLONOSCOPY WITH PROPOFOL ;  Surgeon: Therisa Bi, MD;  Location: [facility] ENDOSCOPY;  Service: Gastroenterology;  Laterality: N/A;   CORONARY ULTRASOUND/IVUS N/A 11/05/2023   Procedure: Coronary Ultrasound/IVUS;  Surgeon: Mady Bruckner, MD;  Location: [facility] INVASIVE CV LAB;  Service: Cardiovascular;  Laterality: N/A;   CORONARY/GRAFT  ACUTE MI REVASCULARIZATION N/A 11/05/2023   Procedure: Coronary/Graft Acute MI Revascularization;  Surgeon: Mady Bruckner, MD;  Location: [facility] INVASIVE CV LAB;  Service: Cardiovascular;  Laterality: N/A;   CYST EXCISION  92 or 93    Liver cyst removal [facility]   FLEXIBLE BRONCHOSCOPY N/A 11/01/2021   Procedure: FLEXIBLE BRONCHOSCOPY;  Surgeon: Parris Manna, MD;  Location: [facility] ORS;  Service: Thoracic;  Laterality: N/A;   FRACTURE SURGERY     HIP PINNING,CANNULATED Left 02/19/2023   Procedure: PERCUTANEOUS FIXATION OF FEMORAL NECK;  Surgeon: Tobie Priest, MD;  Location:  [facility] ORS;  Service: Orthopedics;  Laterality: Left;   I & D EXTREMITY Right 04/29/2017   Procedure: IRRIGATION AND DEBRIDEMENT EXTREMITY;  Surgeon: Shelva Dunnings, MD;  Location: [facility] ORS;  Service: General;  Laterality: Right;   IRRIGATION AND DEBRIDEMENT ABSCESS Left 04/29/2017   Procedure: IRRIGATION AND DEBRIDEMENT Scrotal ABSCESS;  Surgeon: Shelva Dunnings, MD;  Location: [facility] ORS;  Service: General;  Laterality: Left;   LEFT HEART CATH AND CORONARY ANGIOGRAPHY N/A 11/05/2023   Procedure: LEFT HEART CATH AND CORONARY ANGIOGRAPHY;  Surgeon: Mady Bruckner, MD;  Location: [facility] INVASIVE CV LAB;  Service: Cardiovascular;  Laterality: N/A;   POLYPECTOMY  07/01/2023   Procedure: POLYPECTOMY;  Surgeon: Therisa Bi, MD;  Location: [facility] ENDOSCOPY;  Service: Gastroenterology;;   SPINE SURGERY     Patient Active Problem List   Diagnosis Date Noted   B12 deficiency 07/28/2024   Vitamin D  deficiency 07/28/2024   Cervical radicular pain 05/26/2024   History of thoracic spinal fusion (T7-T12) 05/26/2024   History of lumbar spinal fusion (L5-S1) 05/26/2024   Chronic pain syndrome 05/26/2024   Anxiety and depression 02/26/2024   Dyslipidemia 02/26/2024   Peripheral neuropathy 02/26/2024   Essential hypertension 02/26/2024   Vertigo 02/26/2024   Orthostatic hypotension 02/11/2024   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) 01/20/2024   Anxiety 12/14/2023   Overweight (BMI 25.0-29.9) 11/23/2023   Chronic heart failure with preserved ejection fraction (HFpEF) (HCC) 11/23/2023   BPH (benign prostatic hyperplasia) 11/23/2023   Unstable angina (HCC) 11/10/2023   Closed T10 spinal fracture (HCC) 11/06/2023   Closed tricolumnar fracture of thoracic vertebra (HCC) 11/06/2023   Thoracic spine instability 11/06/2023   Chronic bilateral low back pain with left-sided sciatica 07/07/2023   Neuropathy 07/07/2023   Hx of colonic polyps 07/01/2023   Adenomatous polyp of colon  07/01/2023   Erectile disorder 01/22/2023   Coronary artery disease involving native coronary artery of native heart without angina pectoris 09/09/2022   Varicose 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 01/03/2022   Immunocompromised state due to drug therapy 09/18/2021   Prediabetes 11/08/2020   Stage 3a chronic kidney disease (HCC) 11/08/2020   Seasonal allergies 11/08/2020   Rhinosinusitis 11/08/2020   Anticoagulant disorder 11/07/2020   Senile purpura 11/07/2020   MDD (major depressive disorder), recurrent episode, mild 09/12/2019   Other spondylosis with radiculopathy, lumbar region 02/16/2019   Osteoporosis 10/12/2018   Chronic venous insufficiency 10/07/2018   Lymphedema 10/07/2018   SLE glomerulonephritis syndrome, 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HCC) 03/03/2018   Syncope and collapse 12/29/2017   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) 10/29/2016   Hyperlipidemia LDL goal <70 01/15/2016   Obesity (BMI 30.0-34.9) 01/15/2016   Long term current use of anticoagulant 10/04/2015   Systemic lupus erythematosus (HCC) 05/30/2015   COPD, moderate (HCC) 06/27/2014   History of pulmonary embolism 12/11/2013   Nodule of right lung  12/11/2013   Cerebral venous sinus thrombosis 08/21/2013   Cystic disease of liver 08/21/2013   ONSET DATE: November 2024   REFERRING DIAG: imbalance  THERAPY DIAG:  Syncope due to orthostatic hypotension  Unsteadiness on feet  Cervicalgia  Radiculopathy, cervical region  Other low back pain  Difficulty in walking, not elsewhere classified  Radiculopathy, lumbar region  Dizziness and giddiness  Lymphedema, not elsewhere classified  Rationale for Evaluation and Treatment: Rehabilitation  SUBJECTIVE:                                                                                                                                                                                            SUBJECTIVE  STATEMENT:  Pt reports he is feeling ok, but is sore.  Pt notes that when he woke up this morning, the low back was hurting like crazy.  Pt has an appointment with neurosurgeon today at 6.  Pt is not wanting surgery, but wants to get some answers.  PERTINENT HISTORY:   62yoM referred to OPPT for imbalance. Pt just finished OPPT for post surgical neck pain on 08/30/24 at [facility] so he could begin his balance therapy here. Pt has had issues with chronic orthostatic hypotension repeated syncope, fluctuating intolerance to upright. Pt has become quite good at managing his safety at home with symptoms fluctuations. PT checks BP daily, has PRN meds for excessively low or high pressures.   PAIN:  Are you having pain? 7/10  neck/shoulders; 7/10 low back   PRECAUTIONS: Fall  WEIGHT BEARING RESTRICTIONS: No  FALLS: Has patient fallen in last 6 months? Yes 1x in March 2025  LIVING ENVIRONMENT: Lives with: Alone  Lives in: 1 level  Stairs: threshold only Has following equipment at home: RW, canes, walking sticks.   PLOF: 2 years ago was able to go hiking for pleasure ab lib; used to get in and out kayak and do multi day treks overnight with kayak.   PATIENT GOALS: improve tolerance to upright mobility, stop falling as much   OBJECTIVE:  Note: Objective measures were completed at evaluation unless otherwise noted.   FUNCTIONAL TESTS:  TUG: 34sec (armless chair)  5xSTS: 54.9sec with BUE heavy use arm chair  : 14.63 Normal stance eyes closed: 9/22: 5-10sec for 3 bouts  Narrow stance eyes closed: 9/22: 17-18 sec for 3 bouts   PATIENT SURVEYS:  ABC Scale: 47.5%  TREATMENT DATE 10/12/24:   Physical Performance Testing:  6 Min Walk Test:  Instructed patient to ambulate as quickly and as safely as possible for 6 minutes using LRAD. Patient was allowed to take  standing rest breaks without stopping the test, but if the patient required a sitting rest break the clock would be stopped and the test would be over.  Results: 793 feet using an upright walker with supervision. Results indicate that the patient has reduced endurance with ambulation compared to age matched norms.  Age Matched Norms (in meters): 35-69 yo M: 75 F: 69, 58-79 yo M: 55 F: 471, 73-89 yo M: 417 F: 392 MDC: 58.21 meters (190.98 feet) or 50 meters (ANPTA Core Set of Outcome Measures for Adults with Neurologic Conditions, 2018)  Five times Sit to Stand Test (FTSS)  TIME: 43.01 sec  Cut off scores indicative of increased fall risk: >12 sec CVA, >16 sec PD, >13 sec vestibular (ANPTA Core Set of Outcome Measures for Adults with Neurologic Conditions, 2018)  Eyes Closed , Narrow Based Stance - 42 sec    TherEx: To improve strength, endurance, mobility, and function of specific targeted muscle groups or improve joint range of motion or improve muscle flexibility  Seated hamstring curls with BlueTB, 2x10 Seated hip abduction with BlueTB, x10 each LE    10/03/24 -heat applied to neck/back due to acute pain exacerbation today  -attempted manual cervical traction 25sec, limited by guarding, pain -towel roll traction in slight recliner x5 minutes -AMB overground with upwalker: 385ft in 46m46s   09/28/24 -overground AMB x636ft (increased today) upwalker: 60m46s, pain stable *seated rest -overground AMB x667ft (increased today) upwalker: 46m53s *seated rest   *AMB to wellzone, no device  -leg press, seat 12, 1x15, plate 4 (increased previous session) -standing marching x40 (20 per leg) BUE support on leg press (increased reps today)  -standing heel raises x25, BUE support on leg press -leg press, seat 12, 1x15, plate 4 (increased previous session) -standing marching x40 (20 per leg) BUE support on leg press (increased reps today)  -standing heel raises x25, BUE support on leg  press  *AMB back to rehab area  -Side stepping in // bars for motor control training x3 each -backward stepping // bars for motor control training x6 -forward stepup, retor step down on airex pad, hands-free when able (in // bars)   PATIENT EDUCATION: Education details: activity modification when neck is flared up  Person educated: Firefighter method: Discussion, handout Education comprehension: Fair   HOME EXERCISE PROGRAM: Access Code: [email or web address] URL: [email or web address] Date: 09/12/2024 Prepared by: Massie Dollar  Exercises - sit to stand, arms crossed, 28-30 inch surface   - 3 x daily - 1 sets - 8 reps - Standing Near Stance in Corner with Eyes Closed  - 3 x daily - 1 sets - 5 reps - 30 hold - Semi-Tandem Balance at Counter Top Eyes Open  - 1 x daily - 7 x weekly - 3 sets - 4 reps - 15 hold - Standing March with Counter Support  - 1 x daily - 7 x weekly - 3 sets - 10 reps - Seated Long Arc Quad  - 1 x daily - 7 x weekly - 3 sets - 10 reps - Backward Walking with Counter Support  - 1 x daily - 7 x weekly - 3 sets - 5 reps  GOALS: Goals reviewed with patient? No  SHORT TERM GOALS: Target date: 10/07/24  Pt to report no falls  in most recent 4 weeks to indicate good safety practices and symptoms management in the home.  Baseline: reports orthostatic s/s roughly 2x per week.  10/12/24: pt denies any falls in the last 4 weeks Goal status: MET  2.  Pt will demonstrate improvement in 5xSTS performance by >3 seconds.  Baseline: 54.9sec with BUE heavy use arm chair  10/12/24: 43.01 sec Goal status: INITIAL  3.  Pt to demonstrate improvement in by 20% and backwards by 25%  Baseline: 9/22: forward: 14.63 sec (0.57m/s). Backward: 49.04 sec 0.43m/s  10/12/24: 15.21 sec (0.66 m/s); 45.77 sec (0.22 m/s) Goal status: INITIAL   LONG TERM GOALS: Target date: 11/07/24  Pt to demonstrate tolerance of >1084ft sustained AMB c LRAD without presyncope,  without LOB, and without increase in back pain >2/10 points NPRS.  Baseline: tolerated 32ft with mild-moderate increase in L hip and low back pain;  09/26/24: 422ft with Upwalker, 35m53s (no increased pain in back/hip)  10/12/24: 793' with Upwalker, 6 min (+2/10 pain increase in the lumbar region) Goal status: PROGRESSING  2.  5xSTS performance, standard chair, hands free <20sec; hands free from 19.5 surface height in <12sec.   Baseline:  54.9sec with BUE heavy use arm chair  10/12/24: 43.01 sec Goal status: INITIAL  3.  Eye closed in narrow stance x60sec (5x without LOB and without need for hand support).  Baseline: 4-10 sec  10/12/24: 42 sec Goal status: PROGRESSING  ASSESSMENT:  CLINICAL IMPRESSION:  Pt responded well and has made improvement in his overall goals at this time.  Pt has not achieved them at this point, but has made progress towards them.  Pt does still exhibit weakness in the LE's and is limited by lower back pain as well.  Pt was able to ambulate further distance during the assessment, but also increased his pain by 2/10 on the pain scale.  Patient's condition has the potential to improve in response to therapy. Maximum improvement is yet to be obtained. The anticipated improvement is attainable and reasonable in a generally predictable time.   Pt will continue to benefit from skilled therapy to address remaining deficits in order to improve overall QoL and return to PLOF.      OBJECTIVE IMPAIRMENTS: Decreased knowledge of condition, decreased use of DME, decreased mobility, difficulty walking, decreased strength, decreased ROM. ACTIVITY LIMITATIONS: Lifting, standing, walking, squatting, transfers, locomotion level PARTICIPATION LIMITATIONS: Cleaning, laundry, interpersonal relationships, driving, yardwork, community activity.  PERSONAL FACTORS: Age, behavior pattern, education, past/current experiences, transportation, profession  are also affecting patient's  functional outcome.  REHAB POTENTIAL: Good CLINICAL DECISION MAKING: Medium  EVALUATION COMPLEXITY: Moderate    PLAN:  PT FREQUENCY: 1-2x/week PT DURATION: 8 weeks PLANNED INTERVENTIONS: 97110-Therapeutic exercises, 97530- Therapeutic activity, 97112- Neuromuscular re-education, 97535- Self Care, [id number]- Manual therapy, [phone]- Gait training, [phone]- Electrical stimulation (unattended), [phone]- Electrical stimulation (manual), Patient/Family education, Balance training, Stair training, Cryotherapy, and Moist heat  PLAN FOR NEXT SESSION:    -continued leg press, overground balance training with wlaking sparingly due to easily provoked back issues -continue overground AMB of 4-5 laps with upwalker, start adding 2-3lb AW     Fonda Simpers, PT, DPT Physical Therapist - [facility]  [facility]  10/12/24, 11:18 AM

## 2024-10-12 NOTE — Patient Instructions (Signed)
 It was so nice to see you today. Thank you so much for coming in.    I want to discuss things with Dr. Clois to see if he needs a new MRI of your neck and/or a nerve test prior to seeing you back.   Once I hear back from him, I will message you in MyChart with plan.   Your blood pressure was elevated today. I want you to recheck it at home and follow up with your PCP if it remains high. If you have any chest pain, shortness of breath, blurry vision, or headaches then you need to go to ED.    Please do not hesitate to call if you have any questions or concerns. You can also message me in MyChart.   Glade Boys PA-C [phone]     The physicians and staff at [facility] at [facility] are committed to providing excellent care. You may receive a survey asking for feedback about your experience at our office. We value you your feedback and appreciate you taking the time to to fill it out. The [facility] leadership team is also available to discuss your experience in person, feel free to contact us  [phone].

## 2024-10-12 NOTE — Progress Notes (Signed)
 Referring Physician:  [name], PA-C [address] [place],  [name] [address]  Primary Physician:  [name], PA-C  History of Present Illness: [name] has a history of cerebral venous sinus thrombosis, COPD, hypercholesterolemia, hyperlipidemia, HTN, SLE, migraines, MI, history of DVT with IVC filter, lymphedema, obesity, osteoporosis, history of PE, OSA.   Last seen by Dr. Clois on 07/08/24 for follow up of T7-T12 PSF on 11/06/23. He had right shoulder pain with neck pain at that time. Was to see Dr. Marcelino for repeat cervical injections. He was sent to ortho for his right shoulder as well.   He is here for follow up.   He has constant neck pain with radiation to right > left shoulder. He has limited ROM of both shoulders. He had only limited relief after right shoulder injection for a few days. He had only short term relief after his cervical injection in June as well. Unable to do cervical MBB as he cannot stop plavix  until November.   He has been discharged from PT for neck/shoulders. He is in PT now for vertigo.   He is on ELIQUIS  and PLAVIX . He is on neurontin , norco (from [facility]).   Tobacco use: Does not smoke.   Bowel/Bladder Dysfunction: some urinary urgency. No bowel issues.   Conservative measures:  Physical therapy: discharged for neck and shoulder. Seeing PT for vertigo  Multimodal medical therapy including regular antiinflammatories: neurontin , norco  Injections:  Right C7-T1 IL ESI on 06/15/24 [name]  Past Surgery:  T7-T12 PSF on 11/06/23  The symptoms are causing a significant impact on the patient's life.   Review of Systems:  A 10 point review of systems is negative, except for the pertinent positives and negatives detailed in the HPI.  Past Medical History: Past Medical History:  Diagnosis Date   Acute renal failure with acute tubular necrosis superimposed on stage 3b chronic kidney disease (HCC) 04/27/2017    Acute urinary retention 11/11/2023   Anxiety    Asthma    Calculus of kidney 08/21/2013   Cerebral venous sinus thrombosis 08/21/2013   Overview:  superior sagittal sinus, left transverse sinus and cortical veins    Closed left hip fracture, initial encounter (HCC) 02/18/2023   COPD (chronic obstructive pulmonary disease) (HCC)    COVID-19 virus infection 12/2020   Depression    DVT (deep venous thrombosis) (HCC)    Dyspnea    GERD (gastroesophageal reflux disease)    Head injury 11/05/2023   Heart attack (HCC) 11/05/2023   Heel spur, left 02/16/2019   Heel spur, right 02/16/2019   Hematoma of groin 11/08/2023   Herpes zoster infection of lumbar region 02/20/2020   History of DVT (deep vein thrombosis) 03/22/2020   History of kidney stones    HTN (hypertension)    Hyperlipidemia    Hypertension    Long term current use of systemic steroids 11/08/2020   Lupus    Lymphedema 10/07/2018   Morbid obesity (HCC)    Opiate abuse, episodic (HCC) 02/26/2018   Osteoporosis    Pain and swelling of right lower extremity 02/03/2023   Pneumonia    PONV (postoperative nausea and vomiting)    Postphlebitic syndrome with ulcer, 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   Presence of IVC filter 03/22/2020   Removed   Pulmonary embolism (HCC)    Renal disorder    Stage III   Severe episode of recurrent major depressive disorder, 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   Shock circulatory (  HCC) 11/06/2023   Sleep apnea    STEMI (ST elevation myocardial infarction) (HCC) 11/05/2023    Past Surgical History: Past Surgical History:  Procedure Laterality Date   ANKLE SURGERY Right    BACK SURGERY     BRONCHIAL WASHINGS N/A 11/01/2021   Procedure: BRONCHIAL WASHINGS;  Surgeon: Parris Manna, MD;  Location: [facility] ORS;  Service: Thoracic;  Laterality: N/A;   COLONOSCOPY WITH PROPOFOL  N/A 05/28/2020   Procedure: COLONOSCOPY WITH PROPOFOL ;  Surgeon: Therisa Bi, MD;  Location: [facility] ENDOSCOPY;  Service:  Endoscopy;  Laterality: N/A;   COLONOSCOPY WITH PROPOFOL  N/A 07/01/2023   Procedure: COLONOSCOPY WITH PROPOFOL ;  Surgeon: Therisa Bi, MD;  Location: [facility] ENDOSCOPY;  Service: Gastroenterology;  Laterality: N/A;   CORONARY ULTRASOUND/IVUS N/A 11/05/2023   Procedure: Coronary Ultrasound/IVUS;  Surgeon: Mady Bruckner, MD;  Location: [facility] INVASIVE CV LAB;  Service: Cardiovascular;  Laterality: N/A;   CORONARY/GRAFT ACUTE MI REVASCULARIZATION N/A 11/05/2023   Procedure: Coronary/Graft Acute MI Revascularization;  Surgeon: Mady Bruckner, MD;  Location: [facility] INVASIVE CV LAB;  Service: Cardiovascular;  Laterality: N/A;   CYST EXCISION  92 or 93    Liver cyst removal [facility]   FLEXIBLE BRONCHOSCOPY N/A 11/01/2021   Procedure: FLEXIBLE BRONCHOSCOPY;  Surgeon: Parris Manna, MD;  Location: [facility] ORS;  Service: Thoracic;  Laterality: N/A;   FRACTURE SURGERY     HIP PINNING,CANNULATED Left 02/19/2023   Procedure: PERCUTANEOUS FIXATION OF FEMORAL NECK;  Surgeon: Tobie Priest, MD;  Location: [facility] ORS;  Service: Orthopedics;  Laterality: Left;   I & D EXTREMITY Right 04/29/2017   Procedure: IRRIGATION AND DEBRIDEMENT EXTREMITY;  Surgeon: Shelva Dunnings, MD;  Location: [facility] ORS;  Service: General;  Laterality: Right;   IRRIGATION AND DEBRIDEMENT ABSCESS Left 04/29/2017   Procedure: IRRIGATION AND DEBRIDEMENT Scrotal ABSCESS;  Surgeon: Shelva Dunnings, MD;  Location: [facility] ORS;  Service: General;  Laterality: Left;   LEFT HEART CATH AND CORONARY ANGIOGRAPHY N/A 11/05/2023   Procedure: LEFT HEART CATH AND CORONARY ANGIOGRAPHY;  Surgeon: Mady Bruckner, MD;  Location: [facility] INVASIVE CV LAB;  Service: Cardiovascular;  Laterality: N/A;   POLYPECTOMY  07/01/2023   Procedure: POLYPECTOMY;  Surgeon: Therisa Bi, MD;  Location: [facility] ENDOSCOPY;  Service: Gastroenterology;;   SPINE SURGERY      Allergies: Allergies as of 10/12/2024 - Review Complete 10/10/2024  Allergen Reaction Noted   Enalapril Other (See  Comments) 07/31/2015   Vicodin [hydrocodone -acetaminophen ] Hives and Rash 12/09/2013    Medications: Outpatient Encounter Medications as of 10/12/2024  Medication Sig   albuterol  (VENTOLIN  HFA) 108 (90 Base) MCG/ACT inhaler Inhale 2 puffs into the lungs every 6 (six) hours as needed for wheezing or shortness of breath.   apixaban  (ELIQUIS ) 5 MG TABS tablet Take 1 tablet (5 mg total) by mouth 2 (two) times daily.   ARIPiprazole  (ABILIFY ) 2 MG tablet Take 4 mg by mouth daily.   azelastine (ASTELIN) 0.1 % nasal spray 2 sprays 2 (two) times daily.   BENLYSTA 200 MG/ML SOSY  (Patient taking differently: once a week.)   busPIRone  (BUSPAR ) 5 MG tablet Take 1 tablet (5 mg total) by mouth 2 (two) times daily.   carbidopa-levodopa (SINEMET IR) 25-100 MG tablet Take by mouth.   cholecalciferol  (VITAMIN D3) 25 MCG (1000 UNIT) tablet Take 1,000 Units by mouth daily.   ciprofloxacin  (CILOXAN ) 0.3 % ophthalmic solution Place 2 drops into both eyes every 2 (two) hours. Administer 1 drop, every 2 hours, while awake, for 2 days. Then 1 drop, every 4 hours, while awake,  for the next 5 days. (Patient not taking: Reported on 10/06/2024)   clonazePAM  (KLONOPIN ) 0.5 MG tablet Take 0.5 mg by mouth daily.   clopidogrel  (PLAVIX ) 75 MG tablet TAKE 1 TABLET(75 MG) BY MOUTH DAILY   cyanocobalamin  (VITAMIN B12) 1000 MCG/ML injection Vit B12 1000 mcg IM once a week for 3 weeks then once a month for 4 months. During and after loading dose with injection treatment patient should also take 1000 mcg by mouth once a day.   famotidine  (PEPCID ) 20 MG tablet TAKE 1 TABLET(20 MG) BY MOUTH TWICE DAILY   fluticasone  (FLONASE ) 50 MCG/ACT nasal spray Place 2 sprays into both nostrils daily.   gabapentin  (NEURONTIN ) 300 MG capsule Take 1 capsule (300 mg total) by mouth 2 (two) times daily.   hydrALAZINE  (APRESOLINE ) 25 MG tablet Take 1 tablet (25 mg total) by mouth 3 (three) times daily.   HYDROcodone -acetaminophen  (NORCO/VICODIN) 5-325  MG tablet Take 1 tablet by mouth every 8 (eight) hours as needed for severe pain (pain score 7-10). Must last 30 days   hydroxychloroquine (PLAQUENIL) 200 MG tablet Take 200 mg by mouth 2 (two) times daily.   hydrOXYzine  (ATARAX ) 25 MG tablet Take 25-50 mg by mouth at bedtime as needed.   loratadine  (CLARITIN ) 10 MG tablet Take 10 mg by mouth daily.   losartan  (COZAAR ) 25 MG tablet Take 25 mg by mouth daily.   meclizine  (ANTIVERT ) 12.5 MG tablet Take 1 tablet (12.5 mg total) by mouth 3 (three) times daily as needed for dizziness.   metoprolol  succinate (TOPROL -XL) 25 MG 24 hr tablet Take 1 tablet (25 mg total) by mouth daily.   midodrine  (PROAMATINE ) 10 MG tablet Take 10 mg by mouth 3 (three) times daily.   montelukast  (SINGULAIR ) 10 MG tablet Take 1 tablet (10 mg total) by mouth at bedtime.   Multiple Vitamins-Minerals (MULTIVITAMIN WITH MINERALS) tablet Take 1 tablet by mouth daily.   mycophenolate  (CELLCEPT ) 500 MG tablet Take 2 tablets (1,000 mg total) by mouth 2 (two) times daily. HOLD until neurosurgery is ok to restart this (waiting for incision to heal)   oxybutynin  (DITROPAN  XL) 15 MG 24 hr tablet Take 15 mg by mouth daily.   pantoprazole  (PROTONIX ) 20 MG tablet Take 1 tablet (20 mg total) by mouth 2 (two) times daily.   rosuvastatin  (CRESTOR ) 20 MG tablet Take 1 tablet (20 mg total) by mouth daily.   sertraline  (ZOLOFT ) 100 MG tablet Take 1.5 tablets (150 mg total) by mouth daily.   solifenacin  (VESICARE ) 5 MG tablet Take 5 mg by mouth daily.   Syringe/Needle, Disp, (SYRINGE 3CC/25GX1) 25G X 1 3 ML MISC To be used with Vit B12 1000 mcg IM once a week for 4 weeks then once a month for 4 months. During and after loading dose with injection treatment patient should also take 1000 mcg by mouth once a day.   tadalafil  (CIALIS ) 20 MG tablet Take 1 tablet (20 mg total) by mouth daily as needed for erectile dysfunction. Avoid taking it if low blood pressure   tamsulosin  (FLOMAX ) 0.4 MG CAPS  capsule Take 1 capsule (0.4 mg total) by mouth daily.   No facility-administered encounter medications on file as of 10/12/2024.    Social History: Social History   Tobacco Use   Smoking status: Never   Smokeless tobacco: Never  Vaping Use   Vaping status: Never Used  Substance Use Topics   Alcohol use: No   Drug use: No    Comment: PT DENIES  Family Medical History: Family History  Problem Relation Age of Onset   Hypertension Father    Heart disease Father    Cancer Father    Clotting disorder Mother    Hearing loss Mother    Kidney disease Brother    Heart attack Maternal Grandmother    Heart attack Maternal Grandfather    Heart attack Paternal Grandfather     Physical Examination: There were no vitals filed for this visit.    Awake, alert, oriented to person, place, and time.  Speech is clear and fluent. Fund of knowledge is appropriate.   Cranial Nerves: Pupils equal round and reactive to light.  Facial tone is symmetric.    No abnormal lesions on exposed skin.   Strength: Side Biceps Triceps Deltoid Interossei Grip Wrist Ext. Wrist Flex.  R 4- 5 4- 5 5 5 5   L 5 4 5 5 5  4- 4-   Reflexes are 2+ and symmetric at the biceps, brachioradialis  . Hoffman's is absent.   Bilateral upper extremity sensation is intact to light touch.     He has very limited painful ROM of right > left shoulder.   Gait is slow.   Medical Decision Making  Imaging: Nothing recent.   Assessment and Plan: Mr. Daywalt is s/p T7-T12 PSF on 11/06/23.   He has constant neck pain with radiation to right > left shoulder. He has limited ROM of both shoulders. He had only limited relief after right shoulder injection for a few days. He had only short term relief after his cervical injection in June as well. Unable to do cervical MBB as he cannot stop plavix  until November.   He has known cervical spondylosis with mild central and bilateral foraminal stenosis that is moderate at C6-C7  and mild at C4-C5. Also with mild/moderate central and bilateral foraminal stenosis C5-C6.   He continues with weakness in both right and now left arm. He has very limited/painful ROM of both shoulders.   Treatment options discussed with patient and following plan made:   - Will review with Dr. Clois to see if he wants updated cervical MRI and/or EMG prior to seeing patient.  - Will message and call patient with further plan.  - Of note, he cannot stop PLAVIX  until November.   BP was elevated.  No symptoms of chest pain, increased shortness of breath (he has this chronically), blurry vision, or headaches. Will recheck at home and call PCP if not improved. If he develops CP, SOB, blurry vision, or headaches, then he will go to ED.     I spent a total of 25 minutes in face-to-face and non-face-to-face activities related to this patient's care today including review of outside records, review of imaging, review of symptoms, physical exam, discussion of differential diagnosis, discussion of treatment options, and documentation.   ADDENDUM 10/13/24:  Patient reviewed with Dr. Clois. He recommends updated cervical MRI and EMG of bilateral upper extremities. Also recommend that patient follow back up with ortho to see if any further imaging is needed for his shoulders (MRI?).   Will send message to patient and order MRI/EMG if he wants to proceed.   Glade Boys PA-C Dept. of Neurosurgery

## 2024-10-13 ENCOUNTER — Ambulatory Visit: Admitting: Podiatry

## 2024-10-13 ENCOUNTER — Telehealth: Payer: Self-pay | Admitting: Orthopedic Surgery

## 2024-10-13 ENCOUNTER — Encounter: Payer: Self-pay | Admitting: Nurse Practitioner

## 2024-10-13 ENCOUNTER — Encounter: Payer: Self-pay | Admitting: Neurology

## 2024-10-13 ENCOUNTER — Other Ambulatory Visit: Payer: Self-pay

## 2024-10-13 ENCOUNTER — Ambulatory Visit: Attending: Nurse Practitioner | Admitting: Nurse Practitioner

## 2024-10-13 VITALS — BP 150/92 | HR 65 | Temp 97.7°F | Resp 16 | Ht 75.0 in | Wt 220.0 lb

## 2024-10-13 DIAGNOSIS — M216X1 Other acquired deformities of right foot: Secondary | ICD-10-CM | POA: Diagnosis not present

## 2024-10-13 DIAGNOSIS — M5412 Radiculopathy, cervical region: Secondary | ICD-10-CM | POA: Insufficient documentation

## 2024-10-13 DIAGNOSIS — Z7901 Long term (current) use of anticoagulants: Secondary | ICD-10-CM | POA: Diagnosis not present

## 2024-10-13 DIAGNOSIS — Z79899 Other long term (current) drug therapy: Secondary | ICD-10-CM | POA: Insufficient documentation

## 2024-10-13 DIAGNOSIS — G894 Chronic pain syndrome: Secondary | ICD-10-CM | POA: Insufficient documentation

## 2024-10-13 DIAGNOSIS — M216X2 Other acquired deformities of left foot: Secondary | ICD-10-CM | POA: Diagnosis not present

## 2024-10-13 DIAGNOSIS — M47812 Spondylosis without myelopathy or radiculopathy, cervical region: Secondary | ICD-10-CM | POA: Diagnosis not present

## 2024-10-13 DIAGNOSIS — G8929 Other chronic pain: Secondary | ICD-10-CM

## 2024-10-13 DIAGNOSIS — F332 Major depressive disorder, recurrent severe without psychotic features: Secondary | ICD-10-CM | POA: Diagnosis not present

## 2024-10-13 DIAGNOSIS — M722 Plantar fascial fibromatosis: Secondary | ICD-10-CM

## 2024-10-13 DIAGNOSIS — M542 Cervicalgia: Secondary | ICD-10-CM

## 2024-10-13 DIAGNOSIS — R202 Paresthesia of skin: Secondary | ICD-10-CM

## 2024-10-13 MED ORDER — HYDROCODONE-ACETAMINOPHEN 5-325 MG PO TABS: 1.0000 | ORAL_TABLET | Freq: Three times a day (TID) | ORAL | 0 refills | Status: DC | PRN

## 2024-10-13 MED ORDER — JOURNAVX 50 MG PO TABS: 50.0000 mg | ORAL_TABLET | Freq: Two times a day (BID) | ORAL | 1 refills | Status: AC | PRN

## 2024-10-13 NOTE — Telephone Encounter (Signed)
 LMOM informing him there is a Mychart message to view and that he can call or respond to the Mychart message.

## 2024-10-13 NOTE — Progress Notes (Signed)
 PROVIDER NOTE: Interpretation of information contained herein should be left to medically-trained personnel. Specific patient instructions are provided elsewhere under Patient Instructions section of medical record. This document was created in part using AI and STT-dictation technology, any transcriptional errors that may result from this process are unintentional.  Patient: Jonathon Snow  Service: E/M   PCP: [name], PA-C  DOB: 29-Oct-1961  DOS: 10/13/2024  Provider: Emmy MARLA Blanch, NP  MRN: [id number]  Delivery: Face-to-face  Specialty: Interventional Pain Management  Type: Established Patient  Setting: Ambulatory outpatient facility  Specialty designation: 09  Referring Prov.: [name], PA-C  Location: Outpatient office facility       History of present illness (HPI) [name], a 63 y.o. year old male, is here today because of his Cervical facet joint syndrome [M47.812]. Jonathon Snow primary complain today is Neck Pain (Left ) and Back Pain (Cervical, thoracic and lumbar )  Pertinent problems: Jonathon Snow has Other spondylosis with radiculopathy, lumbar region; Overweight (BMI 25.0-29.9); Cervical radicular pain; History of thoracic spinal fusion (T7-T12); History of lumbar spinal fusion (L5-S1); and Chronic pain syndrome on their problem list.  Pain Assessment: Severity of Chronic pain is reported as a 6 /10. Location: Neck (cervcial, thoracic and lumbar spine) Left/into left shoulder and down the left arm.  back pain down left leg and hip. Onset: More than a month ago. Quality: Constant, Aching, Stabbing, Discomfort. Timing: Constant. Modifying factor(s): nothing is helping that much.. Vitals:  height is 6' 3 (1.905 m) and weight is 220 lb (99.8 kg). His temporal temperature is 97.7 F (36.5 C). His blood pressure is 150/92 (abnormal) and his pulse is 65. His respiration is 16 and oxygen saturation is 100%.  BMI: Estimated body mass index is 27.5 kg/m as calculated from the  following:   Height as of this encounter: 6' 3 (1.905 m).   Weight as of this encounter: 220 lb (99.8 kg).  Last encounter: 09/05/2024. Last procedure: Visit date not found.  Reason for encounter: evaluation for possible interventional PM therapy/treatment and medication management. No change in medical history since last visit.  Patient's pain is at baseline.  Patient continues multimodal pain regimen as prescribed.  States that it provides pain relief and improvement in functional status.   Discussed the use of AI scribe software for clinical note transcription with the patient, who gave verbal consent to proceed.  History of Present Illness   Jonathon Snow is a 63 year old male who presents for pain management of chronic neck pain.  The patient presents with complaint of neck pain and right shoulder pain radiating down to the right.  He has already discussed this with Dr. Marcelino, who recommended a Cervical Facet medial branch block (MBNB).  However, the procedure requires him come off his Plavix  and Eliquis , or alternatively obtain cardiac clearance from his cardiology or vascular team.   He has ongoing neck pain, described as painful and persistent, primarily on the left side. Previous procedures were scheduled for the right side. He is currently taking Norco (hydrocodone ) which provides some relief, but he still experiences significant pain.  He is taking Norco and reports no side effects except for constipation, which he manages by increasing water  intake. Additionally, he takes Journavx  and has not experienced any issues obtaining this medication from the pharmacy.  During a previous visit with a cardiologist, it was determined that he needs to be on Plavix  for a year before discontinuing it for the procedure.  Pharmacotherapy Assessment   Hydrocodone -acetaminophen  (Norco/Vicodin) 5-325 mg tablet every 8 hours as needed for pain. MME=10  Journavx  50 mg tabs every 12 hours as needed  for pain Monitoring: [place] PMP: PDMP reviewed during this encounter.       Pharmacotherapy: No side-effects or adverse reactions reported. Compliance: No problems identified. Effectiveness: Clinically acceptable.  No notes on file  UDS:  Summary  Date Value Ref Range Status  07/19/2024 FINAL  Final    Comment:    ==================================================================== Compliance Drug Analysis, Ur ==================================================================== Test                             Result       Flag       Units  Drug Present and Declared for Prescription Verification   Metoprolol                      PRESENT      EXPECTED  Drug Present not Declared for Prescription Verification   Tramadol                        166          UNEXPECTED ng/mg creat   O-Desmethyltramadol            1291         UNEXPECTED ng/mg creat    Source of tramadol  is a prescription medication. O-desmethyltramadol    is an expected metabolite of tramadol .  Drug Absent but Declared for Prescription Verification   Clonazepam                      Not Detected UNEXPECTED ng/mg creat   Sertraline                      Not Detected UNEXPECTED   Aripiprazole                    Not Detected UNEXPECTED ==================================================================== Test                      Result    Flag   Units      Ref Range   Creatinine              94               mg/dL      >=79 ==================================================================== Declared Medications:  The flagging and interpretation on this report are based on the  following declared medications.  Unexpected results may arise from  inaccuracies in the declared medications.   **Note: The testing scope of this panel includes these medications:   Aripiprazole  (Abilify )  Clonazepam  (Klonopin )  Metoprolol   Sertraline  (Zoloft )   **Note: The testing scope of this panel does not include the  following reported  medications:   Apixaban  (Eliquis )  Azelastine (Astelin)  Belimumab (Benlysta)  Carbidopa (Sinemet)  Clopidogrel  (Plavix )  Levodopa (Sinemet)  Midodrine  (Proamatine )  Montelukast  (Singulair )  Multivitamin  Mycophenolate  mofetil (Cellcept )  Oxybutynin  (Ditropan )  Pantoprazole  (Protonix )  Rosuvastatin  (Crestor )  Solifenacin  (Vesicare )  Vitamin B12  Vitamin D3 ==================================================================== For clinical consultation, please call [phone]. ====================================================================     No results found for: CBDTHCR No results found for: D8THCCBX No results found for: D9THCCBX  ROS  Constitutional: Denies any fever or chills Gastrointestinal: No reported hemesis, hematochezia, vomiting, or acute GI distress Musculoskeletal: Neck Pain (Bilateral  shoulers, arms), lower back pain Neurological: No reported episodes of acute onset apraxia, aphasia, dysarthria, agnosia, amnesia, paralysis, loss of coordination, or loss of consciousness  Medication Review  ARIPiprazole , Belimumab, HYDROcodone -acetaminophen , SYRINGE 3CC/25GX1, Suzetrigine , albuterol , alendronate, apixaban , azelastine, busPIRone , carbidopa-levodopa, cholecalciferol , ciprofloxacin , clonazePAM , clopidogrel , cyanocobalamin , famotidine , fluticasone , gabapentin , hydrALAZINE , hydrOXYzine , hydroxychloroquine, loratadine , losartan , meclizine , metoprolol  succinate, midodrine , montelukast , multivitamin with minerals, mycophenolate , oxybutynin , pantoprazole , rosuvastatin , sertraline , silodosin , solifenacin , tadalafil , and tamsulosin   History Review  Allergy: Jonathon Snow is allergic to enalapril and vicodin [hydrocodone -acetaminophen ]. Drug: Jonathon Snow  reports no history of drug use. Alcohol:  reports no history of alcohol use. Tobacco:  reports that he has never smoked. He has never used smokeless tobacco. Social: Jonathon Snow  reports that he has never  smoked. He has never used smokeless tobacco. He reports that he does not drink alcohol and does not use drugs. 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), Closed left hip fracture, initial encounter (HCC) (02/18/2023), COPD (chronic obstructive pulmonary disease) (HCC), COVID-19 virus infection (12/2020), Depression, DVT (deep venous thrombosis) (HCC), Dyspnea, GERD (gastroesophageal reflux disease), Head injury (11/05/2023), Heart attack (HCC) (11/05/2023), Heel spur, left (02/16/2019), Heel spur, right (02/16/2019), Hematoma of groin (11/08/2023), Herpes zoster infection of lumbar region (02/20/2020), History of DVT (deep vein thrombosis) (03/22/2020), History of kidney stones, HTN (hypertension), Hyperlipidemia, Hypertension, Long term current use of systemic steroids (11/08/2020), Lupus, Lymphedema (10/07/2018), Morbid obesity (HCC), Opiate abuse, episodic (HCC) (02/26/2018), Osteoporosis, Pain and swelling of right lower extremity (02/03/2023), Pneumonia, PONV (postoperative nausea and vomiting), Postphlebitic syndrome with ulcer, 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 Surgical: Jonathon Snow  has a past surgical history that includes Cyst excision (92 or 93 ); Ankle surgery (Right); I & D extremity (Right, 04/29/2017); Irrigation and debridement abscess (Left, 04/29/2017); Colonoscopy with propofol  (N/A, 05/28/2020); Flexible bronchoscopy (N/A, 11/01/2021); Bronchial washings (N/A,  11/01/2021); Back surgery; Hip pinning, cannulated (Left, 02/19/2023); Colonoscopy with propofol  (N/A, 07/01/2023); polypectomy (07/01/2023); Coronary/Graft Acute MI Revascularization (N/A, 11/05/2023); LEFT HEART CATH AND CORONARY ANGIOGRAPHY (N/A, 11/05/2023); Coronary Ultrasound/IVUS (N/A, 11/05/2023); Fracture surgery; and Spine surgery. Family: family history includes Cancer in his father; Clotting disorder in his mother; Hearing loss in his mother; Heart attack in his maternal grandfather, maternal grandmother, and paternal grandfather; Heart disease in his father; Hypertension in his father; Kidney disease in his brother.  Laboratory Chemistry Profile   Renal Lab Results  Component Value Date   BUN 23 10/06/2024   CREATININE 1.55 (H) 10/06/2024   LABCREA 153 11/08/2023   BCR 15 10/06/2024   GFRAA 53 (L) 11/07/2020   GFRNONAA 50 (L) 02/27/2024    Hepatic Lab Results  Component Value Date   AST 11 07/28/2024   ALT 9 07/28/2024   ALBUMIN  3.7 02/16/2024   ALKPHOS 91 02/16/2024   LIPASE 34 11/05/2023    Electrolytes Lab Results  Component Value Date   NA 140 10/06/2024   K 4.7 10/06/2024   CL 103 10/06/2024   CALCIUM  9.5 10/06/2024   MG 2.0 11/06/2023   PHOS 3.8 11/06/2023    Bone Lab Results  Component Value Date   VD25OH 35 11/15/2018    Inflammation (  CRP: Acute Phase) (ESR: Chronic Phase) Lab Results  Component Value Date   CRP 0.8 09/10/2021   ESRSEDRATE 25 (H) 09/10/2021   LATICACIDVEN 1.3 08/22/2021         Note: Above Lab results reviewed.  Recent Imaging Review  MR BRAIN/IAC W WO CONTRAST CLINICAL DATA:  Asymmetric hearing loss left worse than right. Tinnitus. Left neck pain.  EXAM: MRI HEAD WITHOUT AND WITH CONTRAST  TECHNIQUE: Multiplanar, multiecho pulse sequences of the brain and surrounding structures were obtained without and with intravenous contrast.  CONTRAST:  10 cc Vueway   COMPARISON:  02/26/2024  FINDINGS: Brain: Diffusion imaging  does not show any acute or subacute infarction or other cause of restricted diffusion. No focal abnormality affects brainstem. Chronic mega cisterna magna incidentally noted, more prominent towards the right. CP angle regions are normal. No vestibular schwannoma or enhancing neuritis. Cerebral hemispheres do not show accelerated volume loss. No evidence of previous large vessel stroke or widespread small-vessel disease. Punctate focus of hemosiderin deposition in the left frontal white matter is unchanged and not likely significant. No hydrocephalus or extra-axial collection. No abnormal brain or leptomeningeal enhancement otherwise.  Vascular: Major vessels at the base of the brain show flow.  Skull and upper cervical spine: Negative  Sinuses/Orbits: Clear/normal  Other: None significant.  IMPRESSION: 1. No cause of the presenting symptoms is identified. No vestibular schwannoma or enhancing neuritis. 2. Chronic mega cisterna magna, more prominent towards the right. 3. Punctate focus of hemosiderin deposition in the left frontal white matter, unchanged and not likely significant.  Electronically Signed   By: Oneil Officer M.D.   On: 08/29/2024 13:11 Note: Reviewed        Physical Exam  Vitals: BP (!) 150/92 (BP Location: Right Arm, Patient Position: Sitting, Cuff Size: Normal)   Pulse 65   Temp 97.7 F (36.5 C) (Temporal)   Resp 16   Ht 6' 3 (1.905 m)   Wt 220 lb (99.8 kg)   SpO2 100%   BMI 27.50 kg/m  BMI: Estimated body mass index is 27.5 kg/m as calculated from the following:   Height as of this encounter: 6' 3 (1.905 m).   Weight as of this encounter: 220 lb (99.8 kg). Ideal: Ideal body weight: 84.5 kg (186 lb 4.6 oz) Adjusted ideal body weight: 90.6 kg (199 lb 12.4 oz) General appearance: Well nourished, well developed, and well hydrated. In no apparent acute distress Mental status: Alert, oriented x 3 (person, place, & time)       Respiratory: No evidence of  acute respiratory distress Eyes: PERLA  Musculoskeletal: Cervicalgia, cervical pain with cervical facet loading  Cervical Spine Exam  Skin & Axial Inspection: No masses, redness, edema, swelling, or associated skin lesions Alignment: Symmetrical Functional ROM: Pain restricted ROM, bilaterally Stability: No instability detected Muscle Tone/Strength: Functionally intact. No obvious neuro-muscular anomalies detected. Sensory (Neurological): Musculoskeletal pain pattern Palpation: (+) Positive provocative maneuver for for cervical facet disease Assessment   Diagnosis Status  1. Cervical facet joint syndrome   2. Cervical radicular pain   3. Chronic pain syndrome   4. Medication management   5. Cervical spondylosis without myelopathy   6. Long term current use of anticoagulant    Persistent Persistent Persistent   Updated Problems: No problems updated.  Plan of Care  Problem-specific:  Assessment and Plan    Cervical Facet Joint Syndrome: Chronic cervical radiculopathy with persistent left-sided pain. Current management with Norco provides partial relief. - Schedule cervical facet block (MBNB)  for mid-November after discontinuation of Plavix  and Eliquis  or Clearance from Cardiology.  - Ensure Plavix  is stopped before the procedure and provide instructions for discontinuation. - Send hydrocodone  (Norco) prescription to [facility]. - Advise to have a driver for the procedure.  Chronic pain syndrome: Patient's pain is well-controlled with hydrocodone , will continue on current medication regimen.  Prescribing drug monitoring (PDMP) reviewed, findings consistent with the use of prescribed medication and no evidence of narcotic misuse or abuse.  The patient was advised to bring pill bottle for pill count in order to continue with the pain management and drink plenty of water  to prevent from opioid induced constipation.  Cervical radicular pain:  Medications    HYDROcodone -acetaminophen  (NORCO/VICODIN) 5-325 MG tablet    Sig: Take 1 tablet by mouth every 8 (eight) hours as needed for severe pain (pain score 7-10). Must last 30 days    Dispense:  90 tablet    Refill:  0    Chronic Pain: STOP Act (Not applicable) Fill 1 day early if closed on refill date. Avoid benzodiazepines within 8 hours of opioids   Suzetrigine  (JOURNAVX ) 50 MG TABS    Sig: Take 50 mg by mouth every 12 (twelve) hours as needed (chronic pain).    Dispense:  60 tablet    Refill:  1    Constipation - Advise increased water  intake to manage constipation.   Plan: (ECT): (B) C-FCT # 1 with Dr. Marcelino , (Blood thinner protocol). Need clearance from Cardiology or Come off from Plavix  and Eliquis  before procedure.       Jonathon Snow has a current medication list which includes the following long-term medication(s): apixaban , carbidopa-levodopa, clonazepam , famotidine , fluticasone , gabapentin , hydralazine , losartan , metoprolol  succinate, montelukast , pantoprazole , rosuvastatin , sertraline , and tadalafil .  Pharmacotherapy (Medications Ordered): Meds ordered this encounter  Medications   HYDROcodone -acetaminophen  (NORCO/VICODIN) 5-325 MG tablet    Sig: Take 1 tablet by mouth every 8 (eight) hours as needed for severe pain (pain score 7-10). Must last 30 days    Dispense:  90 tablet    Refill:  0    Chronic Pain: STOP Act (Not applicable) Fill 1 day early if closed on refill date. Avoid benzodiazepines within 8 hours of opioids   Suzetrigine  (JOURNAVX ) 50 MG TABS    Sig: Take 50 mg by mouth every 12 (twelve) hours as needed (chronic pain).    Dispense:  60 tablet    Refill:  1   Orders:  Orders Placed This Encounter  Procedures   CERVICAL FACET (MEDIAL BRANCH NERVE BLOCK)     Standing Status:   Future    Expiration Date:   01/13/2025    Scheduling Instructions:     Procedure: Cervical facet Block     Type: Medial Branch Block     Side: Bilateral     Purpose:  Diagnostic/Therapeutic     Level(s): C4-5, C5-6, and C6-7 Facet joints (C4, C5, and C6 Medial Branch Nerves)     Sedation: Patient's choice     Timeframe: As soon as schedule allows.    Where will this procedure be performed?:   [facility]   Blood Thinner Instructions to Nursing    Always make sure patient has clearance from prescribing physician to stop blood thinners for interventional therapies. If the patient requires a Lovenox -bridge therapy, make sure arrangements are made to institute it with the assistance of the PCP.    Scheduling Instructions:     Have Jonathon Snow stop the Plavix  (Clopidogrel )  x 7-10 days, and Stop Eliquis  3 days prior to procedure or surgery.        Return in about 3 weeks (around 11/03/2024) for (ECT): (B) C-FCT # 1 with Dr. Marcelino , (Blood thinner protocol).    Recent Visits Date Type Provider Dept  09/05/24 Office Visit Brandilyn Nanninga K, NP [facility]  08/17/24 Office Visit Festus Pursel K, NP [facility]  08/09/24 Office Visit Estell Dillinger K, NP [facility]  07/19/24 Office Visit Marcelino Nurse, MD [facility]  Showing recent visits within past 90 days and meeting all other requirements Today's Visits Date Type Provider Dept  10/13/24 Office Visit Mitchael Luckey K, NP [facility]  Showing today's visits and meeting all other requirements Future Appointments Date Type Provider Dept  11/08/24 Appointment Girl Schissler K, NP [facility]  Showing future appointments within next 90 days and meeting all other requirements  I discussed the assessment and treatment plan with the patient. The patient was provided an opportunity to ask questions and all were answered. The patient agreed with the plan and demonstrated an understanding of the instructions.  Patient advised to call back or seek an in-person evaluation if the symptoms or condition worsens.  I personally spent a total of 30 minutes in  the care of the patient today including preparing to see the patient, getting/reviewing separately obtained history, performing a medically appropriate exam/evaluation, counseling and educating, placing orders, referring and communicating with other health care professionals, documenting clinical information in the EHR, independently interpreting results, communicating results, and coordinating care.   Note by: Brigida Scotti K Luan Urbani, NP (TTS and AI technology used. I apologize for any typographical errors that were not detected and corrected.) Date: 10/13/2024; Time: 10:11 AM

## 2024-10-13 NOTE — Progress Notes (Signed)
 Subjective:  Patient ID: Jonathon Snow, male    DOB: August 10, 1961,  MRN: [id number]  Chief Complaint  Patient presents with   Foot Pain    Left heel pain     63 y.o. male presents with the above complaint.  Patient presents with complaint of left heel pain that has been off for quite some time is progressing and worse worse with ambulation or shoe pressure would like to discuss treatment options for pain scale 7 out of 10 dull aching nature worse with taking for step in the morning.  He has secondary complaint of not wearing orthotics would like to obtain that as well.  He has a tertiary complaint left hallux thickened elongated dystrophic mycotic toenails x 1 he would like to discuss Lamisil  therapy now.  He denies any other acute issues   Review of Systems: Negative except as noted in the HPI. Denies N/V/F/Ch.  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   Overview:  superior sagittal sinus, left transverse sinus and cortical veins    Closed left hip fracture, initial encounter (HCC) 02/18/2023   COPD (chronic obstructive pulmonary disease) (HCC)    COVID-19 virus infection 12/2020   Depression    DVT (deep venous thrombosis) (HCC)    Dyspnea    GERD (gastroesophageal reflux disease)    Head injury 11/05/2023   Heart attack (HCC) 11/05/2023   Heel spur, left 02/16/2019   Heel spur, right 02/16/2019   Hematoma of groin 11/08/2023   Herpes zoster infection of lumbar region 02/20/2020   History of DVT (deep vein thrombosis) 03/22/2020   History of kidney stones    HTN (hypertension)    Hyperlipidemia    Hypertension    Long term current use of systemic steroids 11/08/2020   Lupus    Lymphedema 10/07/2018   Morbid obesity (HCC)     Opiate abuse, episodic (HCC) 02/26/2018   Osteoporosis    Pain and swelling of right lower extremity 02/03/2023   Pneumonia    PONV (postoperative nausea and vomiting)    Postphlebitic syndrome with ulcer, 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   Presence of IVC filter 03/22/2020   Removed   Pulmonary embolism (HCC)    Renal disorder    Stage III   Severe episode of recurrent major depressive disorder, 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   Shock circulatory (HCC) 11/06/2023   Sleep apnea    STEMI (ST elevation myocardial infarction) (HCC) 11/05/2023    Current Outpatient Medications:    terbinafine  (LAMISIL ) 250 MG tablet, Take 1 tablet (250 mg total) by mouth daily., Disp: 90 tablet, Rfl: 0   albuterol  (VENTOLIN  HFA) 108 (90 Base) MCG/ACT inhaler, Inhale 2 puffs into the lungs every 6 (six) hours as needed for wheezing or shortness of breath., Disp: , Rfl:    alendronate (FOSAMAX) 70 MG tablet, Take 70 mg by mouth once a week., Disp: , Rfl:    apixaban  (ELIQUIS ) 5 MG TABS tablet, Take 1 tablet (5 mg total) by mouth 2 (two) times daily., Disp: 180 tablet, Rfl: 1   ARIPiprazole  (ABILIFY ) 2 MG tablet, Take 4 mg by mouth daily., Disp: , Rfl:    azelastine (ASTELIN) 0.1 % nasal spray, 2 sprays 2 (two) times daily., Disp: ,  Rfl:    BENLYSTA 200 MG/ML SOSY, , Disp: , Rfl:    busPIRone  (BUSPAR ) 5 MG tablet, Take 1 tablet (5 mg total) by mouth 2 (two) times daily., Disp: 180 tablet, Rfl: 0   carbidopa-levodopa (SINEMET IR) 25-100 MG tablet, Take by mouth. (Patient not taking: Reported on 10/19/2024), Disp: , Rfl:    cholecalciferol  (VITAMIN D3) 25 MCG (1000 UNIT) tablet, Take 1,000 Units by mouth daily., Disp: , Rfl:    ciprofloxacin  (CILOXAN ) 0.3 % ophthalmic solution, Place 2 drops into both eyes every 2 (two) hours. Administer 1 drop, every 2 hours, while awake, for 2 days. Then 1 drop, every 4 hours, while awake, for the next 5 days., Disp: 5 mL, Rfl: 0   clonazePAM  (KLONOPIN ) 0.5 MG tablet, Take 0.5  mg by mouth daily., Disp: , Rfl:    clopidogrel  (PLAVIX ) 75 MG tablet, TAKE 1 TABLET(75 MG) BY MOUTH DAILY, Disp: 90 tablet, Rfl: 0   cyanocobalamin  (VITAMIN B12) 1000 MCG/ML injection, Vit B12 1000 mcg IM once a week for 3 weeks then once a month for 4 months. During and after loading dose with injection treatment patient should also take 1000 mcg by mouth once a day., Disp: , Rfl:    famotidine  (PEPCID ) 20 MG tablet, TAKE 1 TABLET(20 MG) BY MOUTH TWICE DAILY, Disp: 180 tablet, Rfl: 0   fluticasone  (FLONASE ) 50 MCG/ACT nasal spray, Place 2 sprays into both nostrils daily., Disp: 16 g, Rfl: 6   gabapentin  (NEURONTIN ) 300 MG capsule, Take 1 capsule (300 mg total) by mouth 2 (two) times daily., Disp: 60 capsule, Rfl: 2   hydrALAZINE  (APRESOLINE ) 25 MG tablet, Take 1 tablet (25 mg total) by mouth 3 (three) times daily., Disp: 270 tablet, Rfl: 1   [START ON 11/05/2024] HYDROcodone -acetaminophen  (NORCO/VICODIN) 5-325 MG tablet, Take 1 tablet by mouth every 8 (eight) hours as needed for severe pain (pain score 7-10). Must last 30 days, Disp: 90 tablet, Rfl: 0   hydroxychloroquine (PLAQUENIL) 200 MG tablet, Take 200 mg by mouth 2 (two) times daily., Disp: , Rfl:    hydrOXYzine  (ATARAX ) 25 MG tablet, Take 25-50 mg by mouth at bedtime as needed., Disp: , Rfl:    loratadine  (CLARITIN ) 10 MG tablet, Take 10 mg by mouth daily., Disp: , Rfl:    losartan  (COZAAR ) 25 MG tablet, Take 25 mg by mouth daily., Disp: , Rfl:    meclizine  (ANTIVERT ) 12.5 MG tablet, Take 1 tablet (12.5 mg total) by mouth 3 (three) times daily as needed for dizziness., Disp: 30 tablet, Rfl: 0   metoprolol  succinate (TOPROL -XL) 25 MG 24 hr tablet, Take 1 tablet (25 mg total) by mouth daily., Disp: 90 tablet, Rfl: 3   midodrine  (PROAMATINE ) 10 MG tablet, Take 10 mg by mouth 3 (three) times daily., Disp: , Rfl:    montelukast  (SINGULAIR ) 10 MG tablet, Take 1 tablet (10 mg total) by mouth at bedtime., Disp: 90 tablet, Rfl: 1   Multiple  Vitamins-Minerals (MULTIVITAMIN WITH MINERALS) tablet, Take 1 tablet by mouth daily., Disp: , Rfl:    mycophenolate  (CELLCEPT ) 500 MG tablet, Take 2 tablets (1,000 mg total) by mouth 2 (two) times daily. HOLD until neurosurgery is ok to restart this (waiting for incision to heal), Disp: , Rfl:    oxybutynin  (DITROPAN  XL) 15 MG 24 hr tablet, Take 15 mg by mouth daily., Disp: , Rfl:    pantoprazole  (PROTONIX ) 20 MG tablet, Take 1 tablet (20 mg total) by mouth 2 (two) times daily., Disp: 180 tablet,  Rfl: 3   rosuvastatin  (CRESTOR ) 20 MG tablet, Take 1 tablet (20 mg total) by mouth daily., Disp: 90 tablet, Rfl: 0   sertraline  (ZOLOFT ) 100 MG tablet, Take 1.5 tablets (150 mg total) by mouth daily., Disp: 135 tablet, Rfl: 1   silodosin  (RAPAFLO ) 8 MG CAPS capsule, Take 8 mg by mouth daily., Disp: , Rfl:    solifenacin  (VESICARE ) 5 MG tablet, Take 5 mg by mouth daily., Disp: , Rfl:    Suzetrigine  (JOURNAVX ) 50 MG TABS, Take 50 mg by mouth every 12 (twelve) hours as needed (chronic pain)., Disp: 60 tablet, Rfl: 1   Syringe/Needle, Disp, (SYRINGE 3CC/25GX1) 25G X 1 3 ML MISC, To be used with Vit B12 1000 mcg IM once a week for 4 weeks then once a month for 4 months. During and after loading dose with injection treatment patient should also take 1000 mcg by mouth once a day., Disp: , Rfl:    tadalafil  (CIALIS ) 20 MG tablet, Take 1 tablet (20 mg total) by mouth daily as needed for erectile dysfunction. Avoid taking it if low blood pressure, Disp: 30 tablet, Rfl: 7   tamsulosin  (FLOMAX ) 0.4 MG CAPS capsule, Take 1 capsule (0.4 mg total) by mouth daily., Disp: 30 capsule, Rfl: 11  Social History   Tobacco Use  Smoking Status Never  Smokeless Tobacco Never    Allergies  Allergen Reactions   Enalapril Other (See Comments)    Unknown reaction   Vicodin [Hydrocodone -Acetaminophen ] Hives and Rash    Severe headaches (also) NAME BRAND ONLY PER PT CAN TAKE GENERIC   Objective:  There were no vitals filed  for this visit. There is no height or weight on file to calculate BMI. Constitutional Well developed. Well nourished.  Vascular Dorsalis pedis pulses palpable bilaterally. Posterior tibial pulses palpable bilaterally. Capillary refill normal to all digits.  No cyanosis or clubbing noted. Pedal hair growth normal.  Neurologic Normal speech. Oriented to person, place, and time. Epicritic sensation to light touch grossly present bilaterally.  Dermatologic Nails well groomed and normal in appearance. No open wounds. No skin lesions.  Orthopedic: Normal joint ROM without pain or crepitus bilaterally. No visible deformities. Tender to palpation at the calcaneal tuber left. No pain with calcaneal squeeze left. Ankle ROM diminished range of motion left. Silfverskiold Test: positive left.   Radiographs: None  Assessment:   1. Long-term use of high-risk medication   2. Pes cavus   3. Plantar fasciitis of left foot    Plan:  Patient was evaluated and treated and all questions answered.  Plantar Fasciitis, left - XR reviewed as above.  - Educated on icing and stretching. Instructions given.  - Injection delivered to the plantar fascia as below. - DME: Plantar fascial brace dispensed to support the medial longitudinal arch of the foot and offload pressure from the heel and prevent arch collapse during weightbearing - Pharmacologic management: None  Pes cavus/foot deformity -I explained to patient the etiology of pes cavus and relationship with heel pain/arch pain and various treatment options were discussed.  Given patient foot structure in the setting of heel pain/arch pain I believe patient will benefit from custom-made orthotics to help control the hindfoot motion support the arch of the foot and take the stress away from arches.  Patient agrees with the plan like to proceed with orthotics -Patient was casted for orthotics   Procedure: Injection Tendon/Ligament Location: Left  plantar fascia at the glabrous junction; medial approach. Skin Prep: alcohol Injectate: 0.5 cc  0.5% marcaine  plain, 0.5 cc of 1% Lidocaine , 0.5 cc kenalog  10. Disposition: Patient tolerated procedure well. Injection site dressed with a band-aid.  Left hallux onychomycosis/nail fungus -Educated the patient on the etiology of onychomycosis and various treatment options associated with improving the fungal load.  I explained to the patient that there is 3 treatment options available to treat the onychomycosis including topical, p.o., laser treatment.  Patient elected to undergo p.o. options with Lamisil /terbinafine  therapy.  In order for me to start the medication therapy, I explained to the patient the importance of evaluating the liver and obtaining the liver function test.  Once the liver function test comes back normal I will start him on 34-month course of Lamisil  therapy.  Patient understood all risk and would like to proceed with Lamisil  therapy.  I have asked the patient to immediately stop the Lamisil  therapy if she has any reactions to it and call the office or go to the emergency room right away.  Patient states understanding   No follow-ups on file.

## 2024-10-13 NOTE — Patient Instructions (Signed)
 ______________________________________________________________________    Update on Controlled Substance (Opioid) Regulations   To: All patients taking opioid pain medications. (I.e.: hydrocodone , hydromorphone , oxycodone , oxymorphone, morphine , codeine , methadone, tapentadol, tramadol , buprenorphine, fentanyl , etc.)  Re: Review on the state of controlled substance regulations.  Introduction: Rules and regulations associated with all aspects of controlled substances are constantly being modified. Unfortunately we have encountered patients questioning the veracity of the information that we provide them about these changes. This is intended to provide them with appropriate references and a historical review of these changes.  A Brief History: As of October 06, 2016, the [place]  Government declared the opioid epidemic a public health emergency. Prescription drug monitoring programs (PDMPs) and the [facility] All Schedules Prescription Electronic Reporting Act (NASPER). Before 1800, clinicians regarded pain as an existential phenomenon, a consequence of aging. There was no regulation on the use of cocaine and opioids, resulting in widespread marketing and prescribing for many ailments ranging from diarrhea to toothache. The [employer] of [phone], passed in response to the sudden emergence of street heroin abuse as well as iatrogenic morphine  dependence, influenced both physician and patient alike to avoid opiates. Patients with unexplained pain in the 1920s were regarded as deluded, malingering, or abusers, and cancer patients through the 1950s were encouraged to wean themselves off opioids until their lives "could be measured in weeks". Alongside this opioid evolution, the American Pain Society launched their influential "pain as the fifth vital sign" campaign in 1995. Concurrently, pharmaceutical companies introduced new formulations, such as extended release oxycodone  (OxyContin ). From 1997  to 2002, OxyContin  prescriptions increased from 670,000 to 6.2 million. However, concerns soon began to surface regarding overzealous opioid treatment. It must be noted that pharmaceutical companies contributed significantly to the rise of the opioid epidemic, receiving considerable reprimands as a consequence. In 2007, as the opioid epidemic began to inflict profound damage, [employer] pleaded guilty to federal charges related to the misbranding of OxyContin . [employer] agreed to pay a total of $634.5 million to resolve Justice Department investigations, as well as a $19.5 million settlement to [address] and the [address] of [place].  In response to the current epidemic, changes in focus to the development of new abuse deterrent opioid formulations at the [place]  Food and Drug Administration (FDA) as well as drafting of new public standards for pain treatment were created at [facility] in 2017. In response to the opioid epidemic, FDA public policy changes were announced in February 2016. Among these new positions were a re-examination of the risk-benefit paradigm for opioids with strict emphasis on the large public health ramifications. The various modified opioids released over the past 20 years, such as tamper-resistant preparation, have had differing levels of success, and are collectively referred to as Risk Evaluation and Mitigation Strategies (REMS). There is also a growing focus on preventing opioid use disorder (OUD) and on offering affected individuals accessible and effective treatment. [place]  government policy reflects these changes and both the Affordable Care Act and the Mental Health Parity and Addiction Equity Act were major steps forward in treating opioid addiction. The Affordable Care Act, which was signed into law in 2010, with major provisions coming into effect by 2014.  In the 1990s, the intensified marketing of newly reformulated prescription opioid medications (e.g., OxyContin ) and an influential pain  advocacy campaign that encouraged greater pain management led to a precipitous rise in opioid use in the [place] . Research from the Centers for Disease Control and Prevention (CDC) shows that prescription opioid sales in  the [place]  quadrupled from 1998/11/05 to 2009/11/05. At the same time, opioid misuse and opioid-involved overdose deaths increased (Figure 1). Between 11-05-1998 and Nov 05, 2009, the rate of opioidinvolved overdose deaths in the [place]  doubled from 2.9 to 6.8 deaths per 100,000 people. This initial rise in opioid-related deaths is often referred to as the first wave of the recent opioid crisis.  Between 11/05/1998 and Nov 06, 2019, 565,000 Americans died of opioid-involved overdoses. In turn, federal, state, and local governments responded with various legal and policy efforts to curb opioid misuse and drug-related overdose Deaths.  Recent Congresses have enacted several laws addressing the opioid crisis, such as the Comprehensive Addiction and Recovery Act of 11/06/15 (CARA, P.L. 114-198); the 21st Century Cures Act (P.L. 114-255); the Substance UseDisorder Prevention that Promotes Opioid Recovery and Treatment for Patients and Communities Act (SUPPORT Act, P.L. 4705922661); the Fentanyl  Sanctions Act (Title LXXII of P.L. [id number]); and the Blocking Deadly Fentanyl  Imports Act (P.L. 117-81, 6610). These laws addressed overprescribing and misuse of opioids, expanded substance use disorder prevention and treatment capacities, bolstered drug diversion capabilities, and enhanced international drug interdiction, counternarcotics cooperation, and sanctions efforts. Congress also directed additional funds to many of these initiatives through appropriations.  Congress provided funding in the U.[place]. [employer] Act of 2020-11-05 [phone]; P.L. 117-2) for syringe services programs (often known as needle exchange programs) and other harm reduction initiatives. Federal and state harm reduction strategies  have frequently involved the distribution of naloxone  (e.g., Narcan )--a medication used to reverse an opioid overdose--and test strips used to detect fentanyl  in drug samples.  The Department of Justice (DOJ) and Department of Homeland Security [facility]) aim to reduce the diversion of prescription opioids and the use, manufacturing, and trafficking of illicit opioids. DOJ--via the Drug Enforcement Administration (DEA)--regulates opioid manufacturers, distributors, and dispensers; it also controls the opioid supply through enforcement of regulatory requirements.  A History of Opiate Laws in the [place]   Prior to 1889-11[birth year]05, laws concerning opiates were strictly imposed on a local city or state-by-state basis. One of the first was in [place] in 1874/11/05 where it became illegal to smoke opium  only in opium  dens. It did not ban the sale, import or use otherwise. In the next 25 years different states enacted opium  laws ranging from outlawing opium  dens altogether to making possession of opium , morphine  and heroin without a physician's prescription illegal.  The first Congressional Act took place in 11/05/89 that levied taxes on morphine  and opium . From that time on the [employer] has had a series of laws and acts directly aimed at opiate use, abuse and control. These are outlined below:  1906 - Pure Food and Drug Act Preventing the manufacture, sale, or transportation of adulterated or misbranded or poisonous or deleterious foods, drugs, medicines, and liquors, and for regulating traffic therein, and for other purposes. Punishment included fines and prison time.  1909   - Smoking Opium  Exclusion Act Banned the importation, possession and use of smoking opium . Did not regulate opium -based medications. First Freight forwarder banning the non-medical use of a substance.  1914  - The Margrette Act In summary, The Margrette Act of November 05, 1913 was written more to have all parties involved in importing,  exporting, Set designer and distributing opium  or cocaine to register with the [employer] and have taxes levied upon them. Exempt from the law were physicians operating "in the course of his professional practice"  11/05/1918 - Supreme Court ratified the [employer] in [employer] al., v. [place]   and [place]  v. [place], then again in [facility] v. [place] , in 1920, holding that doctors may not prescribe maintenance supplies of narcotics to people addicted to narcotics. However, it does not prohibit doctors from prescribing narcotics to wean a patient off of the drug. It was also the opinion of the court that prescribing narcotics to habitual users was not considered "professional practice" hence it then was considered illegal for doctors to prescribe opioids for the purposes of maintaining an addiction. It can be argued that today's addiction medications are not intended to maintain an addiction but to facilitate addiction remission. In which case, this opinion of the court should not preclude practitioners from prescribing buprenorphine or methadone to patients suffering from an addictive disorder.  1924  - Heroin Act Architectural technologist, importation and possession of heroin illegal - even for medicinal use.  1922 -- Narcotic Drug Import and Export Act Enacted to assure proper control of importation, sale, possession, production and consumption of narcotics.  1927  -- Special educational needs teacher of Prohibition [employer] of Prohibition was responsible for tracking bootleggers and organized Conservation officer, historic buildings. They focused primarily on interstate and international cases and those cases where local law enforcement official would not or could not act.  1932 -- Uniform State Narcotic Act Encouraged states to pass uniform state laws matching the federal Narcotic Drug Import and Export Act. Suggested prohibiting cannabis use at the state level.  87 -- Food, Drug, and Cosmetic Act The new law  brought cosmetics and medical devices under control, and it required that drugs be labeled with adequate directions for safe use. Moreover, it mandated pre-market approval of all new drugs, such that a manufacturer would have to prove to FDA that a drug were safe before it could be sold  1951 -- Boggs Act Imposed maximum criminal penalties for violations of the import/export and internal revenue laws related to drugs and also established mandatory minimum prison sentences.  1956 -- Narcotics Control Act Increased Boggs Act penalties and mandatory prison sentence minimums for violations of existing drug laws.  1965 -- Drug Abuse Control Amendment Enacted to deal with problems caused by abuse of depressants, stimulants and hallucinogens. Restricted research into psychoactive drugs such as LSD by requiring FDA approval.  1970 -- Controlled Substance Act  Controlled Substances Import and Export Act These laws are a consolidation of numerous laws regulating the manufacture and distribution of narcotics, stimulants, depressants, hallucinogens, anabolic steroids, and chemicals used in the illicit production of controlled substances. The CSA places all substances that are regulated under existing federal law into one of five schedules. This placement is based upon the substance's medicinal value, harmfulness, and potential for abuse or addiction. Schedule I is reserved for the most dangerous drugs that have no recognized medical use, while Schedule V is the classification used for the least dangerous drugs. The act also provides a mechanism for substances to be controlled, added to a schedule, decontrolled, removed from control, rescheduled, or transferred from one schedule to another.  27 - Drug Enforcement Agency By Executive Order, the DEA was formed to take place of the [employer] of Narcotics and Dangerous Drugs.  30 -Narcotic Addict Treatment Act of  1974  - Public Law [phone] Amends the Controlled  Substance Act of 1970 to provide for the registration of practitioners conducting narcotic treatment programs. [methadone clinics] It also provides legal definitions for the phrases "maintenance treatment" and "detoxification treatment".  1986 -- Anti-Drug Abuse Act of 1986 Strengthened Federal efforts to  encourage foreign cooperation in eradicating illicit drug crops and in halting international drug traffic, to improve enforcement of Federal drug laws and enhance interdiction of illicit drug shipments, to provide strong Market researcher in establishing effective drug abuse prevention and education programs, to expand Federal support for drug abuse treatment and rehabilitation efforts, and for other purposes. It also re-imposed mandatory sentencing minimums depending on which drug and how much was involved.  1988 -- Anti-Drug Abuse Act of 1988 Established the Office of Materials engineer (ONDCP) in the [employer] of the Economist; authorized funds for [employer], state and local drug enforcement activities, school-based drug prevention efforts, and drug abuse treatment with special emphasis on injecting drug abusers at high risk for AIDS.  2000 -- Federal - The Drug Addiction Treatment Act of 2000 (DATA 2000) It enables qualified physicians to prescribe and/or dispense narcotics for the purpose of treating opioid dependency. For the first time, physicians are able to treat this disease from their private offices or other clinical settings. This presents a very desirable treatment option for those who are unwilling or unable to seek help in drug treatment clinics. Patients can now be treated in the privacy of their doctor's office, as are other people being treated for any other type of medical condition. One medicine doctors may now prescribe is Buprenorphine. The major downfall of this Act is the limitation of 30 patients per practice - which means that large facilities, no matter how many  physicians are there, can only treat 30 patients at a time.  2002-- DEA reschedules buprenorphine from a schedule V drug to a schedule III drug, on September 27, 2001 - the day before the FDA approval of Suboxone and Subutex despite overwhelming objection by the medical community.  2004: June 2004 THE CONFIDENTIALITY OF ALCOHOL AND DRUG ABUSE PATIENT RECORDS REGULATION AND THE HIPAA PRIVACY RULE:  Confidentiality of Alcohol and Drug Dependence Patient Records (summary) Code of Federal Regulations Title 42 Part 2 (42 CFR Part 2)  The confidentiality of alcohol and drug dependence patient records maintained by this practice/program is protected by federal law and regulations. Generally, the practice/program may not say to a person outside the practice/program that a patient attends the practice/program, or disclose any information identifying a patient as being alcohol or drug dependent unless:  The patient consents in writing; The disclosure is allowed by a court order, or The disclosure is made to medical personnel in a medical emergency or to qualified personnel for research,  audit, or practice/program evaluation. Violation of the federal law and regulations by a practice/program is a crime. Suspected violations may be reported to appropriate authorities in accordance with federal regulations. Freight forwarder and regulations do not protect any information about a crime committed by a patient either at the practice/program or against any person who works for the practice/program or about any threat to commit such a crime. Federal laws and regulations do not protect any information about suspected child abuse or neglect from being reported under state law to appropriate state or local authorities.  sample consent form (MS-WORD)  2005: 07-23-2004 Public law [phone], Amends the Controlled Substances Act to eliminate the 30-patient limit for medical group practices allowed to dispense narcotic drugs in  schedules III, IV, or V for maintenance or detoxification treatment (retains the 30-patient limit for an individual physician). This amendment removes the 30-patient limit on group medical practices that treat opioid dependence with buprenorphine. The restriction was part of the original Drug Addiction Treatment  Act of 2000 (DATA) that allowed treatment of opioid dependence in a doctor's office. With this change, every certified doctor may now prescribe buprenorphine up to his or her individual physician limit of 30 patients.  2006: On 12/19/2005 President Levy signed Bill H.R.6344 into law. This allows physicians who have been certified to prescribe certain drugs for the treatment of opioid dependence under DATA2000 to treat up to 100 patients (up from 30) by submitting an intent notification to the Dept of Health and [employer]. This is a major step forward in both fighting the stigma and allowing access to treatment previously not available to some. For more details see 30/100-PATIENT LIMIT  2016: HHS augments regulations concerning the 30/100 patient limit by raising the limit to 275 for qualifying physicians. Link to summary of regulation  2016: Comprehensive Addiction and Recovery Act of 2016 (sec.303) amends the Controlled Substance Act - to allow Nurse Practitioners and Physician Assistants to become eligible to prescribe buprenorphine for the treatment of opioid use disorder. See the entire law for more details.  The roots of the concurrent regulation of certain drugs under two statutory schemes go back to the beginning of this century. In 1906, Congress enacted the Pure Food and Drug Act, establishing one regime of regulation to assure (among other things) that drugs were not adulterated or misbranded. These regulations were amended several times, recodified in 1938, and expanded on again from the 1940s through the 1990s. Their implementation and enforcement is today assigned to the Food and  Drug Administration (FDA) in the Department of Health and Human Services [facility]).  In 1914, Congress adopted the [place] Narcotic Act to stop abuse of addictive drugs. The Margrette Narcotic Act was amended in 1937 to include marijuana. In 1965, amphetamines, barbiturates, and hallucinogens came under regulation, but under the [employer], Drug, and Cosmetic Act. In 1970, these various statutes were consolidated and recodified as the Controlled Substances Act (CSA), which has been amended several times since then. Its implementation and enforcement is today assigned to the Drug Enforcement Administration (DEA) in the Department of Justice.  The first clash occurred after World War I, when so-called morphine  clinics existed and physicians prescribed or dispensed morphine  to addicts. Some addicts were veterans of the American Civil War, the Spanish-American War, and WWI, who had become addicted during treatment for war wounds, but most of them came from the growing population of nonmedical addicts (Courtwright, 8017). The Narcotics Division of the Prohibition Unit of the Department of the Treasury, which was then responsible for enforcing the [facility] Narcotic Act, concluded that this activity was not the legitimate practice of medicine but simple drug trafficking. The Treasury Department swiftly closed the clinics and made it personally and professionally risky for physicians to maintain a narcotic addict for any reason. In did so, however, only after the American Medical Association had adopted a resolution, in 1920, opposing ambulatory clinics''.  In 1972, the public health establishment, including the Secretary of Health, Education, and Welfare, the Education officer, environmental, the [employer] of [employer], and the Chemical engineer for Drug Abuse Prevention, was unprepared to allow [employer] of Narcotics and Dangerous Drugs, DEA's predecessor agency, to unilaterally define the  parameters of medical practice for the use of methadone in the treatment of heroin addiction. As a consequence, a new set of rules--the third, on top of the FDA and DEA schemes--was added, one that inserted FDA deeply into the practice of medicine, notwithstanding its protestations to the  contrary. Congress ratified this joint responsibility of law enforcement and public health officials for methadone through this third set of rules in 1974 with the passage of the Narcotic Addict Treatment Act (NATA). To examine in detail the evolution of this third set of rules--commonly referred to as the FDA or DHHS methadone regulations--we turn, first, to the period of the mid-1960s.  Increased use of heroin in the post World War II period first became apparent in the early to mid 1950s. During the [employer], a minimum mandatory narcotics law was enacted in 1956, effective July 1957. 1962 [facility] conference on drug abuse, the [employer] on Narcotic and Drug Abuse (the [employer]) of 1963, the Drug Abuse Control Amendments of 1965, the President's Commission on [employer] and Administration of Justice (the [employer]) of [phone], and the Narcotic Addiction Rehabilitation Act of 1966.  The 1965 Drug Abuse Control Amendments brought under strict federal control all nonnarcotic drugs capable of producing serious psychotoxic effects when abused. This act also created the [employer] of Drug Abuse Control within the Department of Health, Education, and Welfare (DHEW) and shifted the basis for [employer] of illegal drugs from tax principles (administered by the Department of Treasury) to the regulation of commerce (administered by the [employer]).  The 1966 Narcotic Addiction Rehabilitation Act Tour manager) authorized the civil commitment of narcotic addicts, and federal assistance to state and local governments to develop a local system of drug treatment programs.  With respect to the latter, the [employer] of Mental Health [facility]) initially proposed the gradual implementation of the state assistance effort, mainly through a common mental health mechanism--inpatient treatment programs. However, because of a perceived pressing need, the courts began to commit addicts to these programs even before they were officially opened or staffed. The NARA legislation imposed the following contract requirements on treatment centers: (1) thrice-a-week counseling sessions; (2) weekly urine tests; (3) restorative dental services; (4) psychological consultations and vocational training; and (5) the treatment modalities of drug-free outpatient, therapeutic community, and methadone maintenance. Reorganization Plan No. 1 of 1968 transferred the primary functions of the [employer] of Narcotics (FBN) from the [employer] to the Department of Justice; it also transferred the [employer] of Drug Abuse Control functions to the Department of Justice. Within the [employer], the [employer] of Narcotics and Dangerous Drugs (BNDD) was created, which became the Drug Enforcement Administration in 1973.   Under the first [place] administration [phone]), federal drug abuse policy developed in a significant way. These developments included a 1969 war on drugs presidential message, resulting legislation in 1970, and a Special Action Office created by executive order in 1971 and authorized in statute in 1972. Brynn, in 1969, to send a message to Congress on drug abuse. Although this was the first time that a U.S. president invoked the war on drugs image, it was in retrospect the most balanced approach to the problem of drug abuse that had been advanced. The 1969 message resulted in the submission of legislation to the Congress and the passage, the following year, of the Comprehensive Drug Abuse Prevention and Control Act of 1970 [employer] [phone], October 17, 1969). The act dealt  with research, treatment, and prevention of drug abuse and drug dependence, and with drug abuse Charity fundraiser. One major purpose of the 1970 legislation was to reverse some of the strictures of the [employer] of 1914. The 1970 act sought to clarify for the medical profession . . . the  extent to which they may safely go in treating narcotic addicts as patients. Title I, in Section IV, charged the Surveyor, minerals, Education, and Welfare, to determine the appropriate methods of professional practice in the medical treatment of the narcotic addiction of various classes of narcotic addicts. This provision constitutes the initial statutory basis for treatment standards. The law enforcement sections consolidated all prior federal statutes into the Controlled Substances Act and the Controlled Substances Export and Import Act (Titles II and III, respectively, of the Comprehensive Drug Abuse Prevention and Control Act of 1970). Under this legislation, substances were classified under five schedules according to their abuse potential, and psychological and physical effects. Methadone was placed in Schedule II, along with such opiate drugs as morphine , codeine , and hydrocodone .  One of the most important steps taken by President Brynn was to establish in June 1971 the Special Action Office for Drug Abuse Prevention (SAODAP) in the [employer] of the President ([place] [phone], June 07, 1970). In mid-1971, [facility] appointed Dr. Maple Dunnings as SAODAP director. Within a year, the Drug Abuse Prevention Office and Treatment Act of 1972 [employer] [phone], March 12, 1971) gave statutory authority to [facility], but limiting setting, on June 20, 1974, as the limit on its existence.  The purpose of the 1972 act was to bring the resources of the federal government to bear on drug abuse with the immediate objective of significantly reducing its incidence and developing a comprehensive, coordinated  long-term federal strategy to combat drug abuse.  Narcotic Addict Treatment Act [employer]) of 1974 [employer] [phone]), which amended the Controlled Substances Act. This legislation was driven by concern for the diversion of methadone to illicit channels that was occurring in 1972 and 1973, as reflected in the title of the Senate bill adopted on May 29, 1972, the Methadone Diversion Control Act of 1973. (U.S. Senate, 1970a, 8029a).  The 1980 final rule (45 FR 37305, September 10, 1979) reduced the minimum standard for admission from two years of addiction to one year coupled with a clinical determination that the individual was currently physiologically.  The regulations were next revised in 1989, following two proposals to modify them, one in 1983 and one in 1987.  Under President Tanda Corrente, a government-wide effort was made to review all federal government regulations and to eliminate or reduce the burden of these regulations on the private sector, state and [employer], and [employer].   The 1983 recommendations, though not adopted, did initiate another revision of the methadone regulations, which first found expression in a 1987 proposed rule (52 FR 37047, September 22, 1986) and culminated in a final rule (54 FR 8954, February 21, 1988) at the end of the decade. In the 1987 proposed rule, the FDA and NIDA, in an effort to put the best face on the unenthusiastic 1983 response by the provider community to converting the regulations to guidelines, indicated that they had retained the current requirements necessary to achieve the goals of the 1974 NATA, but were proposing to streamline the regulation and to promote more efficient operation of methadone programs. The 1987 proposed rule, issued by the FDA and NIDA, advanced the following changes in the methadone regulation: that detoxification treatment be divided into short-term (<21 days) and long-term (>21 and <180 days) treatment; that  the minimum staffing ratio of one counselor to 50 patients be eliminated; that blood tests be allowed as ways to conduct initial drug screening or to meet the monthly testing requirements for  six-day take-home patients; that the 72-hour notification of FDA and the pertinent state authority for methadone doses greater than 100 mg be eliminated; that special adverse reaction reporting requirements for methadone be eliminated and reliance placed upon general FDA reporting requirements; that a supervising counselor be allowed to conduct the annual review of the patient's treatment plan for certain qualified patients who had been in treatment for 3 years or longer; and that the requirement of an annual report of methadone treatment programs to the FDA be dropped. The FDA and NIDA issued a final rule on February 21, 1988, based on comments on the 1987 proposed (54 FR 8954). Concurrently, FDA and NIDA issued a six-page guidance document, which noted that the regulations, over time, had recommended certain practices that were not actually required. Public Health Service, in Congress, and elsewhere, to reorganize the Alcohol, Drug Abuse, and Mental Health Administration (ADAMHA). These efforts culminated in the [employer] of 1992 [employer] [phone], July 01, 1991), the main purpose of which was to transfer the research portions of the three ADAMHA institutes--NIDA, the [employer] of Alcoholism and Alcohol Abuse, and the [employer] of Mental Health--to the [employer] and to create the Substance Abuse and Museum/gallery exhibitions officer [facility]) as the home for the service functions of these entitles.  Guidelines for Opioid Treatment The Federal Guidelines for Opioid Treatment Programs - 2015 serve as a guide to accrediting organizations for developing accreditation standards. The guidelines also provide OTPs with information on how programs can achieve and maintain  compliance with federal regulations. The 2015 guidelines are an update to the 2007 Guidelines for the Accreditation of Opioid Treatment Programs (PDF  547 KB). The new document reflects the obligation of OTPs to deliver care consistent with the patient-centered, integrated, and recovery-oriented standards of substance use treatment.  DPT oversees the certification of OTPs and provides guidance to nonprofit organizations and state governmental entities that want to become a SAMHSA-approved accrediting body. Learn more about the accreditation and certification of OTPs and [employer] oversight of OTP accreditation bodies.  Model Guidelines for [facility] Boards With input from [facility], the Federation of [employer] in 2013 adopted a revised version of the federation's office-based opioid treatment policies. The Model Policy on DATA 2000 and Treatment of Opioid Addiction in the Medical Office - 2013 (PDF  279 KB) provides model guidelines for use by state medical boards in regulating office-based opioid treatment.  Holiday Guidance for Opioid Treatment Programs (PDF  203 KB) In response to requests for the upcoming federal holidays and ensuing weekends (December 24th, 25th, and 26th and December 31st, Jan 1st, and Jan 2nd), this letter is to provide guidance regarding requests for unsupervised doses of medication for patients for these dates. View a sample [id number] (PDF  194 KB).  Federal regulation of drugs emerged as early as 85, under a law that addressed only imported drugs. In 1905 the [employer] launched a private, voluntary means of controlling a substantial part of the drug marketplace, a system that remained in place for over a half-century. Drug regulation in FDA has evolved considerably since President Ricardo Para signed the 1906 Pure Food and Drugs Act.  1820 Eleven doctors set up the U.S. Pharmacopeia and record the first list of standard  drugs. 1848 Drug Importation Act passed by Congress requires U.S. Customs Service inspection to stop entry of tainted, low quality drugs from overseas. 8116 Dr. Mitchell MICAEL Burrs becomes the chief chemist at  the [employer] of Chemistry's food adulteration studies.  1905 The American Medical Association [facility]) begins a voluntary program of drug approval that would last until 1955. In order to advertise in the [facility] and related journals, drug companies must show proof that the drug will treat what they claim. 1906 The original Food and Drug Act is passed by Congress on June 30 and signed by [employer]. The Act outlaws states from buying and selling food, drinks, and drugs that have been mislabeled and tainted. 1911 In U.S. v. Vicci, the [employer] that the [employer] and Drugs Act does not outlaw false medical claims but only false and misleading statements about the ingredients or identity of a drug. 1912 Congress passes the [place] Amendment to overcome the ruling in U.S. v. Vicci. The Act outlaws labeling medicines with fake medical claims that is meant to trick the buyer. 1930 The name of the Food, Drug, and Insecticide Administration is shortened to Food and Drug Administration (FDA) under an Therapist, music. 1933 FDA recommends a total rewrite of the out-of-date 1906 Food and Drugs Act.   1937 Elixir Sulfanilamide, contain the poisonous liquid, diethylene glycol, kills 107 persons, many of whom are children, dramatizing the need to establish drug safety before marketing and to pass the pending food and drug law. 1938 Congress passes [employer], Drug, and Cosmetic (FDC) Act of 1938, which requires that new drugs show safety before selling. This starts a new system of drug regulation. The Act also requires that safe limits be set for unavoidable poisonous matter and allows for factory inspections. The [employer] is given  power to oversee advertising for all FDAregulated products except prescription drugs. FDA states that sulfanilamide and other dangerous drugs must be given under the direction of a medical expert. This begins the requirement for prescription only (nonnarcotic) drugs (see 1951 [place] amendment). 1941 Nearly 300 deaths and injuries result from the use of sulfathiazole tablets, an antibiotic, tainted with the sedative, phenobarbital. In response, FDA drastically changes manufacturing and quality controls. These changes lead to the development of good manufacturing practices (GMPs). 1948 The [employer] in U.S. v. Floretta that FDA jurisdiction extends to retail stores, thereby allowing FDA to stop illegal sales of drugs by pharmacies including barbiturates and amphetamines. 1950 In Walgreen. v. U.S., a U.S. Court of Appeals rules that the directions for use on a drug label must include the drug's purpose. 1951 Congress passes the [place]-Humphrey Amendment, which defines the kinds of drugs that cannot be used safely without medical supervision. The amendment limits sale of these drugs to prescription only by a medical professional. All other drugs are to be available without a prescription. 1952 A nationwide investigation by FDA reveals that chloramphenicol, an antibiotic, caused nearly 180 cases of often deadly blood diseases. Two years later FDA engages the [employer] of Hospital Pharmacists, the American Association of Medical Record Librarians, and later the American Medical Association in a voluntary program of drug reaction reporting. 1953 The [employer] Amendment clarifies previous law and requires FDA to give manufacturers written reports of conditions seen during inspections and results of factory samples. 1962 Thalidomide, a new sleeping pill, causes severe birth defects of the arms and legs in thousands of babies born in [place]. The U.S. media reports on how Dr. Cathlean Mort, a FDA medical officer, helped prevent approval and marketing of Thalidomide in the [place] . These reports stirred up public support for  stronger drug laws. 3 Congress passes the [employer]. For the first time, these laws require drug makers to prove their drug works before FDA can approve them for sale. The Advisory Committee on Investigational Drugs meets for the first time. This was the first meeting of a committee to advise FDA on product approval and policy on an ongoing basis. 1966 FDA contracts with the [employer] of [employer] to measure the effectiveness of 4,000 marketed drugs approved on the basis of safety alone between [phone] and 10/29/61. The Fair Packaging and Labeling Act requires all consumer products, in interstate commerce, to be honestly and informatively labeled. October 30, 1967 FDA forms the Drug Efficacy Study Implementation (DESI) to carry out recommendations of the [employer] of the effectiveness of drugs first sold between [place] and 11/08/19621970/11/08 FDA requires the first patient package insert, medicines must come with information for the patient about risks and benefits. 1972 Over-the-Counter Drug Review begins to enhance the safety, effectiveness and appropriate labeling of drugs sold without prescription. 1973 The U.S. Supreme Court upholds the [place] drug effectiveness law and approves FDA's action to control entire classes of products. 1982 FDA issues Tamper-resistant Packaging Regulations to prevent poisonings such as deaths from cyanide placed in Tylenol  capsules. Congress passes the [employer] in 10-29-82, making it a crime to tamper with packaged consumer products. 1983-10-30 Drug Price Advertising account planner Act (Hatch-Waxman Act) increases the availability of less costly generic drugs by allowing FDA to  approve applications for generic versions of brand-name drugs without repeating the research that proved the safety and effectiveness of the brand-name drugs. The Act also allowed brand-name companies to apply for up to five years additional patent protection for the new medicines they developed to make up for time lost while their products were going through FDA's approval process. 1989 The FDA issued guidelines asking drug makers to decide if a drug is likely to have usefulness in elderly people and to include elderly people in studies when applicable. 1991 In October 29, 1980, the FDA and the Department of Health and Human Services published a policy on protecting people in research. In 10/29/1990, this policy is adopted by more than a dozen federal agencies involved in human subject research and becomes known as the Common Rule. 4 1993 FDA launches MedWatch, a system designed to collect reports from health professionals on problems with drugs and other medical products. FDA issues guidelines for measuring gender differences in responses to medication. Drug companies are encouraged to include patients of both sexes in their research of drugs and to study any gender-specific effects. 1995 FDA declares cigarettes to be drug delivery devices. Limits are issued on marketing and sales to reduce smoking by young people. 1998 FDA introduces the Adverse Event Reporting System (AERS), a computerized database designed to store and study safety reports on already marketed drugs.  The Demographic Rule requires that a marketing application review data on safety and effectiveness by age, gender, and race. The Pediatric Rule requires drug makers of selected new and existing drugs to conduct studies on drug safety and effectiveness in children. 1999 Creation of the Drug Facts Label for OTC drug products. The law requires all overthe-counter drug labels to have information in a standard format. These drug facts  labels are designed to give the user easy-to-find information. 2000 The U. S. [employer], upholds an earlier decision from The [employer] and Drug Administration v. [employer]. et al. and rules  5-4 that FDA does not have authority to regulate tobacco as a drug. 2002 The Best Pharmaceuticals for Children Act, in exchange for studying the drug in children, the drug maker gets six months of selling their product without competition. 2003 The Pediatric Research Equity Act gives FDA the right to ask drug companies to study the effectiveness of new drugs in children. 2004 FDA advises medical professionals to limit the use of a pain reliever called Cox-2, a nonsteroidal anti-inflammatory drug (NSAIDs). Studies had shown that long-term use raised chances of heart attacks and strokes. The warning is also added to the over-thecounter NSAIDs' Drug Facts label. Medicines used in hospitals must have a bar code to prevent patients from receiving the wrong medicine. 5 2005 The Drug Safety Board is formed, consisting of FDA staff and representatives from the [employer] of [address] and the [employer]IG[employer]. The Board advises the Director, Center for Drug Evaluation and Research, FDA, on drug safety issues and works with the agency in sharing safety information to health professionals and patients.  The [place]  Food and Drug Administration (FDA) was first created to enforce the Pure Food and Drug Act of 1906. In this capacity, the FDA is charged with protecting the health of the US  public, to ensure the quality of its food, medicine, and cosmetics. Before this time, the [place]  government had no formal oversight of these products and left issues of quality and purity to the individual manufactures, or at times, individual states.    Review: [place] Stop ACT. (The Strengthen Opioid Misuse Prevention (STOP) Act of 2017). GENERAL ASSEMBLY OF [place]  SESSION 2017  SESSION LAW 2017-74 HOUSE BILL 243  PMP mandatory The dispenser shall report: (1) The dispenser's DEA number. (2) The name of the patient for whom the controlled substance is being dispensed, and the patient's: a. Full address, including city, state, and zip code, b. Telephone number, and c. Date of birth. (3) The date the prescription was written. (4) The date the prescription was filled. (5) The prescription number. (6) Whether the prescription is new or a refill. (7) Metric quantity of the dispensed drug. (8) Estimated days of supply of dispensed drug, if provided to the dispenser. (9) National Drug Code of dispensed drug. (10) Prescriber's DEA number. (11) Method of payment for the prescription.  No paper prescriptions  Duration of scripts Acute vs Chronic prescribing  2016 CDC Guidelines for prescribing Opioids for Chronic Pain. (Updated in 2022.) Medical Board  Laws:  Prescription Laws Drug laws, rules, and regulations are constantly changing. Any attempt to summarize them would quickly become outdated. Because of that, the Board encourages practitioners who seek guidance on prescribing procedures to refer to the sources listed below in addition to the Board's position statements, rules and Medical Practice Act.  [place]  Board of Pharmacy (NCBOP) (which offers the state's pharmacy laws and rules, and links to the Code of Federal Regulations) [employer] Site: www.ncbop.org  [place]  General Statutes General Web Site: PoliticalPool.cz See: [place]  Food, Drug, and Cosmetic Act: [id number] & 106-134 See: [place]  Pharmacy Practice Act, Article 4A: [phone] See: [place]  Controlled Substances Act, Article 5: 90-86 & 90-113.8 See: Use of controlled substances to render one mentally incapacitated or physically helpless: [facility] [facility]. Code, Title 21, Food & Drugs www.deadiversion.usdoj.gov Controlled Substances Schedules  www.deadiversion.usdoj.gov Drug Warehouse manager - www.deadiversion.usdoj.gov 42 CFR  8.12 - Federal opioid treatment standards.   Effective August 17, 2016, prior approval  will be required for opioid analgesic doses for [place] [facility]) beneficiaries which:  Exceed 120 mg of morphine  equivalents (MME) per day  Are greater than a 14-day supply of any opioid, or,  Are non-preferred opioid products on the [place] Medicaid Preferred Drug List (PDL)  FEDERAL 42 CFR  8.12 - Federal opioid treatment standards. Title II of the Comprehensive Drug Abuse Prevention and Control Act of 1970, commonly known as the Controlled Substance Act (CSA) Title 21 [place]  Code ([place]) Controlled Substances Act.   Reference:   ______________________________________________________________________       ______________________________________________________________________    Medication Rules  Purpose: To inform patients, and their family members, of our medication rules and regulations.  Applies to: All patients receiving prescriptions from our practice (written or electronic).  Pharmacy of record: This is the pharmacy where your electronic prescriptions will be sent. Make sure we have the correct one.  Electronic prescriptions: In compliance with the [place]  Strengthen Opioid Misuse Prevention (STOP) Act of 2017 (Session Law 2017-74/H243), effective December 22, 2018, all controlled substances must be electronically prescribed. Written prescriptions, faxing, or calling prescriptions to a pharmacy will no longer be done.  Prescription refills: These will be provided only during in-person appointments. No medications will be renewed without a face-to-face evaluation with your provider. Applies to all prescriptions.  NOTE: The following applies primarily to controlled substances (Opioid* Pain Medications).   Type of encounter (visit):  For patients receiving controlled substances, face-to-face visits are required. (Not an option and not up to the patient.)  Patient's Responsibilities: Pain Pills: Bring all pain pills to every appointment (except for procedure appointments). Pill counts are required.  Pill Bottles: Bring pills in original pharmacy bottle. Bring bottle, even if empty. Always bring the bottle of the most recent fill.  Medication refills: You are responsible for knowing and keeping track of what medications you are taking and when is it that you will need a refill. The day before your appointment: write a list of all prescriptions that need to be refilled. The day of the appointment: give the list to the admitting nurse. Prescriptions will be written only during appointments. No prescriptions will be written on procedure days. If you forget a medication: it will not be Called in, Faxed, or electronically sent. You will need to get another appointment to get these prescribed. No early refills. Do not call asking to have your prescription filled early. Partial  or short prescriptions: Occasionally your pharmacy may not have enough pills to fill your prescription.  NEVER ACCEPT a partial fill or a prescription that is short of the total amount of pills that you were prescribed.  With controlled substances the law allows 72 hours for the pharmacy to complete the prescription.  If the prescription is not completed within 72 hours, the pharmacist will require a new prescription to be written. This means that you will be short on your medicine and we WILL NOT send another prescription to complete your original prescription.  Instead, request the pharmacy to send a carrier to a nearby branch to get enough medication to provide you with your full prescription. Prescription Accuracy: You are responsible for carefully inspecting your prescriptions before leaving our office. Have the discharge nurse carefully go over each  prescription with you, before taking them home. Make sure that your name is accurately spelled, that your address is correct. Check the name and dose of your medication to make sure it is accurate. Check  the number of pills, and the written instructions to make sure they are clear and accurate. Make sure that you are given enough medication to last until your next medication refill appointment. Taking Medication: Take medication as prescribed. When it comes to controlled substances, taking less pills or less frequently than prescribed is permitted and encouraged. Never take more pills than instructed. Never take the medication more frequently than prescribed.  Inform other Doctors: Always inform, all of your healthcare providers, of all the medications you take. Pain Medication from other Providers: You are not allowed to accept any additional pain medication from any other Doctor or Healthcare provider. There are two exceptions to this rule. (see below) In the event that you require additional pain medication, you are responsible for notifying us , as stated below. Cough Medicine: Often these contain an opioid, such as codeine  or hydrocodone . Never accept or take cough medicine containing these opioids if you are already taking an opioid* medication. The combination may cause respiratory failure and death. Medication Agreement: You are responsible for carefully reading and following our Medication Agreement. This must be signed before receiving any prescriptions from our practice. Safely store a copy of your signed Agreement. Violations to the Agreement will result in no further prescriptions. (Additional copies of our Medication Agreement are available upon request.) Laws, Rules, & Regulations: All patients are expected to follow all [address] and [employer], [employer], Rules, [place] [employer]. Ignorance of the Laws does not constitute a valid excuse.  Illegal drugs and Controlled Substances: The use of illegal  substances (including, but not limited to marijuana and its derivatives) and/or the illegal use of any controlled substances is strictly prohibited. Violation of this rule may result in the immediate and permanent discontinuation of any and all prescriptions being written by our practice. The use of any illegal substances is prohibited. Adopted CDC guidelines & recommendations: Target dosing levels will be at or below 60 MME/day. Use of benzodiazepines** is not recommended. Urine Drug testing: Patients taking controlled substances will be required to provide a urine sample upon request. Do not void before coming to your medication management appointments. Hold emptying your bladder until you are admitted. The admitting nurse will inform you if a sample is required. Our practice reserves the right to call you at any time to provide a sample. Once receiving the call, you have 24 hours to comply with request. Not providing a sample upon request may result in termination of medication therapy.  Exceptions: There are only two exceptions to the rule of not receiving pain medications from other Healthcare Providers. Exception #1 (Emergencies): In the event of an emergency (i.e.: accident requiring emergency care), you are allowed to receive additional pain medication. However, you are responsible for: As soon as you are able, call our office [phone], at any time of the day or night, and leave a message stating your name, the date and nature of the emergency, and the name and dose of the medication prescribed. In the event that your call is answered by a member of our staff, make sure to document and save the date, time, and the name of the person that took your information.  Exception #2 (Planned Surgery): In the event that you are scheduled by another doctor or dentist to have any type of surgery or procedure, you are allowed (for a period no longer than 30 days), to receive additional pain medication, for  the acute post-op pain. However, in this case, you are responsible for picking  up a copy of our Post-op Pain Management for Surgeons handout, and giving it to your surgeon or dentist. This document is available at our office, and does not require an appointment to obtain it. Simply go to our office during business hours (Monday-Thursday from 8:00 AM to 4:00 PM) (Friday 8:00 AM to 12:00 Noon) or if you have a scheduled appointment with us , prior to your surgery, and ask for it by name. In addition, you are responsible for: calling our office [phone], at any time of the day or night, and leaving a message stating your name, name of your surgeon, type of surgery, and date of procedure or surgery. Failure to comply with your responsibilities may result in termination of therapy involving the controlled substances.  Consequences:  Non-compliance with the above rules may result in permanent discontinuation of medication prescription therapy. All patients receiving any type of controlled substance is expected to comply with the above patient responsibilities. Not doing so may result in permanent discontinuation of medication prescription therapy. Medication Agreement Violation. Following the above rules, including your responsibilities will help you in avoiding a Medication Agreement Violation ("Breaking your Pain Medication Contract").  *Opioid medications include: morphine , codeine , oxycodone , oxymorphone, hydrocodone , hydromorphone , meperidine, tramadol , tapentadol, buprenorphine, fentanyl , methadone. **Benzodiazepine medications include: diazepam (Valium), alprazolam  (Xanax ), clonazepam  (Klonopine), lorazepam  (Ativan ), clorazepate (Tranxene), chlordiazepoxide (Librium), estazolam (Prosom), oxazepam (Serax), temazepam (Restoril), triazolam (Halcion) (Last updated: 10/14/2023) ______________________________________________________________________      ______________________________________________________________________    Preparing for your procedure  Appointments: If you think you may not be able to keep your appointment, call 24-48 hours in advance to cancel. We need time to make it available to others.  Procedure visits are for procedures only. During your procedure appointment there will be: NO Prescription Refills*. NO medication changes or discussions*. NO discussion of disability issues*. NO unrelated pain problem evaluations*. NO evaluations to order other pain procedures*. *These will be addressed at a separate and distinct evaluation encounter on the provider's evaluation schedule and not during procedure days.  Instructions: Food intake: Avoid eating anything solid for at least 8 hours prior to your procedure. Clear liquid intake: You may take clear liquids such as water  up to 2 hours prior to your procedure. (No carbonated drinks. No soda.) Transportation: Unless otherwise stated by your physician, bring a driver. (Driver cannot be a Market researcher, Pharmacist, community, or any other form of public transportation.) Morning Medicines: Except for blood thinners, take all of your other morning medications with a sip of water . Make sure to take your heart and blood pressure medicines. If your blood pressure's lower number is above 100, the case will be rescheduled. Blood thinners: Make sure to stop your blood thinners as instructed.  If you take a blood thinner, but were not instructed to stop it, call our office [phone] and ask to talk to a nurse. Not stopping a blood thinner prior to certain procedures could lead to serious complications. Diabetics on insulin : Notify the staff so that you can be scheduled 1st case in the morning. If your diabetes requires high dose insulin , take only  of your normal insulin  dose the morning of the procedure and notify the staff that you have done so. Preventing infections: Shower with an antibacterial soap the  morning of your procedure.  Build-up your immune system: Take 1000 mg of Vitamin C with every meal (3 times a day) the day prior to your procedure. Antibiotics: Inform the nursing staff if you are taking any antibiotics or if you  have any conditions that may require antibiotics prior to procedures. (Example: recent joint implants)   Pregnancy: If you are pregnant make sure to notify the nursing staff. Not doing so may result in injury to the fetus, including death.  Sickness: If you have a cold, fever, or any active infections, call and cancel or reschedule your procedure. Receiving steroids while having an infection may result in complications. Arrival: You must be in the facility at least 30 minutes prior to your scheduled procedure. Tardiness: Your scheduled time is also the cutoff time. If you do not arrive at least 15 minutes prior to your procedure, you will be rescheduled.  Children: Do not bring any children with you. Make arrangements to keep them home. Dress appropriately: There is always a possibility that your clothing may get soiled. Avoid long dresses. Valuables: Do not bring any jewelry or valuables.  Reasons to call and reschedule or cancel your procedure: (Following these recommendations will minimize the risk of a serious complication.) Surgeries: Avoid having procedures within 2 weeks of any surgery. (Avoid for 2 weeks before or after any surgery). Flu Shots: Avoid having procedures within 2 weeks of a flu shots or . (Avoid for 2 weeks before or after immunizations). Barium: Avoid having a procedure within 7-10 days after having had a radiological study involving the use of radiological contrast. (Myelograms, Barium swallow or enema study). Heart attacks: Avoid any elective procedures or surgeries for the initial 6 months after a Myocardial Infarction (Heart Attack). Blood thinners: It is imperative that you stop these medications before procedures. Let us  know if you if you take  any blood thinner.  Infection: Avoid procedures during or within two weeks of an infection (including chest colds or gastrointestinal problems). Symptoms associated with infections include: Localized redness, fever, chills, night sweats or profuse sweating, burning sensation when voiding, cough, congestion, stuffiness, runny nose, sore throat, diarrhea, nausea, vomiting, cold or Flu symptoms, recent or current infections. It is specially important if the infection is over the area that we intend to treat. Heart and lung problems: Symptoms that may suggest an active cardiopulmonary problem include: cough, chest pain, breathing difficulties or shortness of breath, dizziness, ankle swelling, uncontrolled high or unusually low blood pressure, and/or palpitations. If you are experiencing any of these symptoms, cancel your procedure and contact your primary care physician for an evaluation.  Remember:  Regular Business hours are:  Monday to Thursday 8:00 AM to 4:00 PM  Provider's Schedule: Eric Como, MD:  Procedure days: Tuesday and Thursday 7:30 AM to 4:00 PM  Wallie Sherry, MD:  Procedure days: Monday and Wednesday 7:30 AM to 4:00 PM Last  Updated: 12/01/2023 ______________________________________________________________________     ______________________________________________________________________    Blood Thinners  IMPORTANT NOTICE:  If you take any of these, make sure to notify the nursing staff.  Failure to do so may result in serious injury.  Recommended time intervals to stop and restart blood-thinners, before & after invasive procedures  Generic Name Brand Name Pre-procedure: Stop medication for this amount of time before your procedure: Post-procedure: Wait this amount of time after the procedure before restarting your medication:  Abciximab Reopro 15 days 2 hrs  Alteplase Activase 10 days 10 days  Anagrelide Agrylin    Apixaban  Eliquis  3 days 6 hrs  Cilostazol Pletal  3 days 5 hrs  Clopidogrel  Plavix  7-10 days 2 hrs  Dabigatran Pradaxa 5 days 6 hrs  Dalteparin Fragmin 24 hours 4 hrs  Dipyridamole Aggrenox 11days 2 hrs  Edoxaban Lixiana; Savaysa 3 days 2 hrs  Enoxaparin   Lovenox  24 hours 4 hrs  Eptifibatide Integrillin 8 hours 2 hrs  Fondaparinux  Arixtra 72 hours 12 hrs  Hydroxychloroquine Plaquenil 11 days   Prasugrel Effient 7-10 days 6 hrs  Reteplase Retavase 10 days 10 days  Rivaroxaban Xarelto 3 days 6 hrs  Ticagrelor Brilinta 5-7 days 6 hrs  Ticlopidine Ticlid 10-14 days 2 hrs  Tinzaparin Innohep 24 hours 4 hrs  Tirofiban Aggrastat 8 hours 2 hrs  Warfarin Coumadin  5 days 2 hrs   Other medications with blood-thinning effects  NOTE: Consider stopping these if you have prolonged bleeding despite not taking any of the above blood thinners. Otherwise ask your provider and this will be decided on a case-by-case basis.  Product indications Generic (Brand) names Note  Cholesterol Lipitor Stop 4 days before procedure  Blood thinner (injectable) Heparin  (LMW or LMWH Heparin ) Stop 24 hours before procedure  Cancer Ibrutinib (Imbruvica) Stop 7 days before procedure  Malaria/Rheumatoid Hydroxychloroquine (Plaquenil) Stop 11 days before procedure  Thrombolytics  10 days before or after procedures   Over-the-counter (OTC) Products with blood-thinning effects  NOTE: Consider stopping these if you have prolonged bleeding despite not taking any of the above blood thinners. Otherwise ask your provider and this will be decided on a case-by-case basis.  Product Common names Stop Time  Aspirin  > 325 mg Goody Powders, Excedrin, etc. 11 days  Aspirin  <= 81 mg  7 days  Fish oil  4 days  Garlic supplements  7 days  Ginkgo biloba  36 hours  Ginseng  24 hours  NSAIDs Ibuprofen, Naprosyn, etc. 3 days  Vitamin E  4 days   ______________________________________________________________________

## 2024-10-13 NOTE — Telephone Encounter (Signed)
 I sent him a MyChart message, but he also wanted a phone call.   Please let him know that I reviewed everything with Dr. Clois. He recommends a new cervical MRI and also getting a nerve test (EMG) of his arms prior to any consideration of surgery.   He also wants him to follow back up with Dr. Sharrie (ortho) to see if he recommends any further treatment or imaging for his shoulders.   Let me know if he wants to proceed with the MRI and nerve test and I can get them ordered.

## 2024-10-14 ENCOUNTER — Ambulatory Visit: Admitting: Physical Therapy

## 2024-10-14 ENCOUNTER — Ambulatory Visit

## 2024-10-14 ENCOUNTER — Ambulatory Visit (INDEPENDENT_AMBULATORY_CARE_PROVIDER_SITE_OTHER): Admitting: Vascular Surgery

## 2024-10-14 ENCOUNTER — Encounter (INDEPENDENT_AMBULATORY_CARE_PROVIDER_SITE_OTHER): Payer: Self-pay | Admitting: Vascular Surgery

## 2024-10-14 VITALS — BP 151/87 | HR 65 | Resp 18 | Wt 220.6 lb

## 2024-10-14 DIAGNOSIS — I825Y2 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: Secondary | ICD-10-CM | POA: Diagnosis not present

## 2024-10-14 DIAGNOSIS — M542 Cervicalgia: Secondary | ICD-10-CM

## 2024-10-14 DIAGNOSIS — R42 Dizziness and giddiness: Secondary | ICD-10-CM

## 2024-10-14 DIAGNOSIS — M5459 Other low back pain: Secondary | ICD-10-CM

## 2024-10-14 DIAGNOSIS — R2681 Unsteadiness on feet: Secondary | ICD-10-CM | POA: Diagnosis not present

## 2024-10-14 DIAGNOSIS — I10 Essential (primary) hypertension: Secondary | ICD-10-CM

## 2024-10-14 DIAGNOSIS — N1831 Chronic kidney disease, stage 3a: Secondary | ICD-10-CM | POA: Diagnosis not present

## 2024-10-14 DIAGNOSIS — R262 Difficulty in walking, not elsewhere classified: Secondary | ICD-10-CM

## 2024-10-14 DIAGNOSIS — I890 Lymphedema, not elsewhere classified: Secondary | ICD-10-CM

## 2024-10-14 DIAGNOSIS — I951 Orthostatic hypotension: Secondary | ICD-10-CM | POA: Diagnosis not present

## 2024-10-14 DIAGNOSIS — M5416 Radiculopathy, lumbar region: Secondary | ICD-10-CM

## 2024-10-14 DIAGNOSIS — M5412 Radiculopathy, cervical region: Secondary | ICD-10-CM | POA: Diagnosis not present

## 2024-10-14 LAB — HEPATIC FUNCTION PANEL
ALT: 13 IU/L (ref 0–44)
AST: 15 IU/L (ref 0–40)
Albumin: 4.1 g/dL (ref 3.9–4.9)
Alkaline Phosphatase: 128 IU/L — ABNORMAL HIGH (ref 47–123)
Bilirubin Total: 0.7 mg/dL (ref 0.0–1.2)
Bilirubin, Direct: 0.25 mg/dL (ref 0.00–0.40)
Total Protein: 6.9 g/dL (ref 6.0–8.5)

## 2024-10-14 MED ORDER — TERBINAFINE HCL 250 MG PO TABS: 250.0000 mg | ORAL_TABLET | Freq: Every day | ORAL | 0 refills | Status: DC

## 2024-10-14 NOTE — Assessment & Plan Note (Signed)
 Symptoms are currently well-controlled.  Referral to a lymphedema clinic has been put in.  Continue to use the lymphedema pump once to twice daily.  Continue use compression socks every day.  Elevate and exercise.  Follow-up in 6 months.

## 2024-10-14 NOTE — Therapy (Signed)
 OUTPATIENT PHYSICAL THERAPY TREATMENT  Patient Name: Jonathon Snow MRN: [id number] DOB:1961/11/03, 63 y.o., male Today's Date: 10/14/2024  PCP: [name], PA-C REFERRING PROVIDER: Maree Jannett POUR, MD  END OF SESSION:  PT End of Session - 10/14/24 0806     Visit Number 11    Number of Visits 16    Date for Recertification  11/02/24    Authorization Type [name] [employer]    Authorization Time Period 09/07/24-11/02/24    Progress Note Due on Visit 10    PT Start Time 0804    PT Stop Time 0844    PT Time Calculation (min) 40 min    Equipment Utilized During Treatment Gait belt    Activity Tolerance Patient tolerated treatment well;No increased pain;Patient limited by pain    Behavior During Therapy [facility] for tasks assessed/performed          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   Overview:  superior sagittal sinus, left transverse sinus and cortical veins    Closed left hip fracture, initial encounter (HCC) 02/18/2023   COPD (chronic obstructive pulmonary disease) (HCC)    COVID-19 virus infection 12/2020   Depression    DVT (deep venous thrombosis) (HCC)    Dyspnea    GERD (gastroesophageal reflux disease)    Head injury 11/05/2023   Heart attack (HCC) 11/05/2023   Heel spur, left 02/16/2019   Heel spur, right 02/16/2019   Hematoma of groin 11/08/2023   Herpes zoster infection of lumbar region 02/20/2020   History of DVT (deep vein thrombosis) 03/22/2020   History of kidney stones    HTN (hypertension)    Hyperlipidemia    Hypertension    Long term current use of systemic steroids 11/08/2020   Lupus    Lymphedema 10/07/2018   Morbid obesity (HCC)    Opiate abuse, episodic (HCC) 02/26/2018   Osteoporosis    Pain  and swelling of right lower extremity 02/03/2023   Pneumonia    PONV (postoperative nausea and vomiting)    Postphlebitic syndrome with ulcer, 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   Presence of IVC filter 03/22/2020   Removed   Pulmonary embolism (HCC)    Renal disorder    Stage III   Severe episode of recurrent major depressive disorder, 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   Shock circulatory (HCC) 11/06/2023   Sleep apnea    STEMI (ST elevation myocardial infarction) (HCC) 11/05/2023   Past Surgical History:  Procedure Laterality Date   ANKLE SURGERY Right    BACK SURGERY     BRONCHIAL WASHINGS N/A 11/01/2021   Procedure: BRONCHIAL WASHINGS;  Surgeon: Parris Manna, MD;  Location: [facility] ORS;  Service: Thoracic;  Laterality: N/A;   COLONOSCOPY WITH PROPOFOL  N/A 05/28/2020   Procedure: COLONOSCOPY WITH PROPOFOL ;  Surgeon: Therisa Bi, MD;  Location: [facility] ENDOSCOPY;  Service: Endoscopy;  Laterality: N/A;   COLONOSCOPY WITH PROPOFOL  N/A 07/01/2023   Procedure: COLONOSCOPY WITH PROPOFOL ;  Surgeon: Therisa Bi, MD;  Location: [facility] ENDOSCOPY;  Service: Gastroenterology;  Laterality: N/A;   CORONARY ULTRASOUND/IVUS N/A 11/05/2023   Procedure: Coronary Ultrasound/IVUS;  Surgeon: Mady Bruckner, MD;  Location: [facility] INVASIVE CV LAB;  Service: Cardiovascular;  Laterality: N/A;   CORONARY/GRAFT ACUTE MI REVASCULARIZATION N/A 11/05/2023   Procedure:  Coronary/Graft Acute MI Revascularization;  Surgeon: Mady Bruckner, MD;  Location: [facility] INVASIVE CV LAB;  Service: Cardiovascular;  Laterality: N/A;   CYST EXCISION  92 or 93    Liver cyst removal [facility]   FLEXIBLE BRONCHOSCOPY N/A 11/01/2021   Procedure: FLEXIBLE BRONCHOSCOPY;  Surgeon: Parris Manna, MD;  Location: [facility] ORS;  Service: Thoracic;  Laterality: N/A;   FRACTURE SURGERY     HIP PINNING,CANNULATED Left 02/19/2023   Procedure: PERCUTANEOUS FIXATION OF FEMORAL NECK;  Surgeon: Tobie Priest, MD;  Location: [facility] ORS;  Service: Orthopedics;   Laterality: Left;   I & D EXTREMITY Right 04/29/2017   Procedure: IRRIGATION AND DEBRIDEMENT EXTREMITY;  Surgeon: Shelva Dunnings, MD;  Location: [facility] ORS;  Service: General;  Laterality: Right;   IRRIGATION AND DEBRIDEMENT ABSCESS Left 04/29/2017   Procedure: IRRIGATION AND DEBRIDEMENT Scrotal ABSCESS;  Surgeon: Shelva Dunnings, MD;  Location: [facility] ORS;  Service: General;  Laterality: Left;   LEFT HEART CATH AND CORONARY ANGIOGRAPHY N/A 11/05/2023   Procedure: LEFT HEART CATH AND CORONARY ANGIOGRAPHY;  Surgeon: Mady Bruckner, MD;  Location: [facility] INVASIVE CV LAB;  Service: Cardiovascular;  Laterality: N/A;   POLYPECTOMY  07/01/2023   Procedure: POLYPECTOMY;  Surgeon: Therisa Bi, MD;  Location: [facility] ENDOSCOPY;  Service: Gastroenterology;;   SPINE SURGERY     Patient Active Problem List   Diagnosis Date Noted   B12 deficiency 07/28/2024   Vitamin D  deficiency 07/28/2024   Cervical radicular pain 05/26/2024   History of thoracic spinal fusion (T7-T12) 05/26/2024   History of lumbar spinal fusion (L5-S1) 05/26/2024   Chronic pain syndrome 05/26/2024   Anxiety and depression 02/26/2024   Dyslipidemia 02/26/2024   Peripheral neuropathy 02/26/2024   Essential hypertension 02/26/2024   Vertigo 02/26/2024   Orthostatic hypotension 02/11/2024   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) 01/20/2024   Anxiety 12/14/2023   Overweight (BMI 25.0-29.9) 11/23/2023   Chronic heart failure with preserved ejection fraction (HFpEF) (HCC) 11/23/2023   BPH (benign prostatic hyperplasia) 11/23/2023   Unstable angina (HCC) 11/10/2023   Closed T10 spinal fracture (HCC) 11/06/2023   Closed tricolumnar fracture of thoracic vertebra (HCC) 11/06/2023   Thoracic spine instability 11/06/2023   Chronic bilateral low back pain with left-sided sciatica 07/07/2023   Neuropathy 07/07/2023   Hx of colonic polyps 07/01/2023   Adenomatous polyp of colon 07/01/2023   Erectile disorder  01/22/2023   Coronary artery disease involving native coronary artery of native heart without angina pectoris 09/09/2022   Varicose 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 01/03/2022   Immunocompromised state due to drug therapy 09/18/2021   Prediabetes 11/08/2020   Stage 3a chronic kidney disease (HCC) 11/08/2020   Seasonal allergies 11/08/2020   Rhinosinusitis 11/08/2020   Anticoagulant disorder 11/07/2020   Senile purpura 11/07/2020   MDD (major depressive disorder), recurrent episode, mild 09/12/2019   Other spondylosis with radiculopathy, lumbar region 02/16/2019   Osteoporosis 10/12/2018   Chronic venous insufficiency 10/07/2018   Lymphedema 10/07/2018   SLE glomerulonephritis syndrome, 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HCC) 03/03/2018   Syncope and collapse 12/29/2017   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) 10/29/2016   Hyperlipidemia LDL goal <70 01/15/2016   Obesity (BMI 30.0-34.9) 01/15/2016   Long term current use of anticoagulant 10/04/2015   Systemic lupus erythematosus (HCC) 05/30/2015   COPD, moderate (HCC) 06/27/2014   History of pulmonary embolism 12/11/2013   Nodule of right lung 12/11/2013   Cerebral venous sinus thrombosis 08/21/2013  Cystic disease of liver 08/21/2013   ONSET DATE: November 2024   REFERRING DIAG: imbalance  THERAPY DIAG:  Syncope due to orthostatic hypotension  Unsteadiness on feet  Cervicalgia  Other low back pain  Difficulty in walking, not elsewhere classified  Radiculopathy, lumbar region  Dizziness and giddiness  Rationale for Evaluation and Treatment: Rehabilitation  SUBJECTIVE:                                                                                                                                                                                            SUBJECTIVE STATEMENT:  Pt reports he isis tired and hurting. States that he did not sleep last night due to neck and  low back pain. Rates pains as 6/10 this day.  Spoke with pain doctor yesterday. Reports that there are plans for nerve block possibly some time in November. Mat get to visit daughter in [place] this weekend.   PERTINENT HISTORY:   62yoM referred to OPPT for imbalance. Pt just finished OPPT for post surgical neck pain on 08/30/24 at [facility] so he could begin his balance therapy here. Pt has had issues with chronic orthostatic hypotension repeated syncope, fluctuating intolerance to upright. Pt has become quite good at managing his safety at home with symptoms fluctuations. PT checks BP daily, has PRN meds for excessively low or high pressures.   PAIN:  Are you having pain? 7/10  neck/shoulders; 7/10 low back   PRECAUTIONS: Fall  WEIGHT BEARING RESTRICTIONS: No  FALLS: Has patient fallen in last 6 months? Yes 1x in March 2025  LIVING ENVIRONMENT: Lives with: Alone  Lives in: 1 level  Stairs: threshold only Has following equipment at home: RW, canes, walking sticks.   PLOF: 2 years ago was able to go hiking for pleasure ab lib; used to get in and out kayak and do multi day treks overnight with kayak.   PATIENT GOALS: improve tolerance to upright mobility, stop falling as much   OBJECTIVE:  Note: Objective measures were completed at evaluation unless otherwise noted.   FUNCTIONAL TESTS:  TUG: 34sec (armless chair)  5xSTS: 54.9sec with BUE heavy use arm chair  : 14.63 Normal stance eyes closed: 9/22: 5-10sec for 3 bouts  Narrow stance eyes closed: 9/22: 17-18 sec for 3 bouts   PATIENT SURVEYS:  ABC Scale: 47.5%  TREATMENT DATE 10/14/24:    10/24:  Amb overground 2 x 471ft. With upright RW Sit<>stand throughout session with supervision assist and heavy UE support. X 10  Seated:   LAQ x 10 BTB HS curl x 12 BTB  Clam shell x 10  BTB  Sit<>supine with supervision assist from PT  Heat applied to cspine and low back in semireclined position  Gentle STM to superior Cspine and suboccipital region x 8 min with slow gentle increased pressure; pt noted to have decreased sensitivity with increased time allowing slight pressure with STM, but reports mild increased nausea after 7-8 minutes.   After 1 min rest, reports decreased nausea.   Upon completion of STM pt reports slight improved cervical ROM. But no change in pain.    10/22:  Physical Performance Testing: See goals for details    10/03/24 -heat applied to neck/back due to acute pain exacerbation today  -attempted manual cervical traction 25sec, limited by guarding, pain -towel roll traction in slight recliner x5 minutes -AMB overground with upwalker: 370ft in 5m46s   09/28/24 -overground AMB x631ft (increased today) upwalker: 40m46s, pain stable *seated rest -overground AMB x617ft (increased today) upwalker: 36m53s *seated rest   *AMB to wellzone, no device  -leg press, seat 12, 1x15, plate 4 (increased previous session) -standing marching x40 (20 per leg) BUE support on leg press (increased reps today)  -standing heel raises x25, BUE support on leg press -leg press, seat 12, 1x15, plate 4 (increased previous session) -standing marching x40 (20 per leg) BUE support on leg press (increased reps today)  -standing heel raises x25, BUE support on leg press  *AMB back to rehab area  -Side stepping in // bars for motor control training x3 each -backward stepping // bars for motor control training x6 -forward stepup, retor step down on airex pad, hands-free when able (in // bars)   PATIENT EDUCATION: Education details: activity modification when neck is flared up  Person educated: Firefighter method: Discussion, handout Education comprehension: Fair   HOME EXERCISE PROGRAM: Access Code: [email or web address] URL: [email or web address] Date:  09/12/2024 Prepared by: Massie Dollar  Exercises - sit to stand, arms crossed, 28-30 inch surface   - 3 x daily - 1 sets - 8 reps - Standing Near Stance in Corner with Eyes Closed  - 3 x daily - 1 sets - 5 reps - 30 hold - Semi-Tandem Balance at Counter Top Eyes Open  - 1 x daily - 7 x weekly - 3 sets - 4 reps - 15 hold - Standing March with Counter Support  - 1 x daily - 7 x weekly - 3 sets - 10 reps - Seated Long Arc Quad  - 1 x daily - 7 x weekly - 3 sets - 10 reps - Backward Walking with Counter Support  - 1 x daily - 7 x weekly - 3 sets - 5 reps  GOALS: Goals reviewed with patient? No  SHORT TERM GOALS: Target date: 10/07/24  Pt to report no falls in most recent 4 weeks to indicate good safety practices and symptoms management in the home.  Baseline: reports orthostatic s/s roughly 2x per week.  10/12/24: pt denies any falls in the last 4 weeks Goal status: MET  2.  Pt will demonstrate improvement in 5xSTS performance by >3 seconds.  Baseline: 54.9sec with BUE heavy use arm chair  10/12/24: 43.01 sec Goal status: INITIAL  3.  Pt to demonstrate improvement in by 20% and backwards  by 25%  Baseline: 9/22: forward: 14.63 sec (0.74m/s). Backward: 49.04 sec 0.74m/s  10/12/24: 15.21 sec (0.66 m/s); 45.77 sec (0.22 m/s) Goal status: INITIAL   LONG TERM GOALS: Target date: 11/07/24  Pt to demonstrate tolerance of >1011ft sustained AMB c LRAD without presyncope, without LOB, and without increase in back pain >2/10 points NPRS.  Baseline: tolerated 331ft with mild-moderate increase in L hip and low back pain;  09/26/24: 43ft with Upwalker, 33m53s (no increased pain in back/hip)  10/12/24: 793' with Upwalker, 6 min (+2/10 pain increase in the lumbar region) Goal status: PROGRESSING  2.  5xSTS performance, standard chair, hands free <20sec; hands free from 19.5 surface height in <12sec.   Baseline:  54.9sec with BUE heavy use arm chair  10/12/24: 43.01 sec Goal status:  INITIAL  3.  Eye closed in narrow stance x60sec (5x without LOB and without need for hand support).  Baseline: 4-10 sec  10/12/24: 42 sec Goal status: PROGRESSING  ASSESSMENT:  CLINICAL IMPRESSION:  Pt responded well to interventions. PT treatment focused on BLE strengthening and pain management on this day. Was noted to have hypersensitivity with STM initially, but then was able to relax to tolerate light pressure to superior cervical spine and suboccipital region. Does report mild nausea after ~7 min, but states that he has slightly improved ROM upon completion. Pt will continue to benefit from skilled therapy to address remaining deficits in order to improve overall QoL and return to PLOF.      OBJECTIVE IMPAIRMENTS: Decreased knowledge of condition, decreased use of DME, decreased mobility, difficulty walking, decreased strength, decreased ROM. ACTIVITY LIMITATIONS: Lifting, standing, walking, squatting, transfers, locomotion level PARTICIPATION LIMITATIONS: Cleaning, laundry, interpersonal relationships, driving, yardwork, community activity.  PERSONAL FACTORS: Age, behavior pattern, education, past/current experiences, transportation, profession  are also affecting patient's functional outcome.  REHAB POTENTIAL: Good CLINICAL DECISION MAKING: Medium  EVALUATION COMPLEXITY: Moderate    PLAN:  PT FREQUENCY: 1-2x/week PT DURATION: 8 weeks PLANNED INTERVENTIONS: 97110-Therapeutic exercises, 97530- Therapeutic activity, 97112- Neuromuscular re-education, [phone]- Self Care, 02859- Manual therapy, [phone]- Gait training, [phone]- Electrical stimulation (unattended), [phone]- Electrical stimulation (manual), Patient/Family education, Balance training, Stair training, Cryotherapy, and Moist heat  PLAN FOR NEXT SESSION:    -continued leg press, overground balance training with walking sparingly due to easily provoked back issues -continue overground AMB of 4-5 laps with upwalker, start adding  2-3lb AW    Massie Dollar PT, DPT  Physical Therapist - [facility]  [facility]  9:15 AM 10/14/24

## 2024-10-14 NOTE — Progress Notes (Signed)
 MRN : [id number]  Jonathon Snow is a 63 y.o. (25-Dec-1960) male who presents with chief complaint of  Chief Complaint  Patient presents with   Follow-up    1 month follow up  .  History of Present Illness: Patient returns today in follow up of his leg swelling, lymphedema, and chronic venous insufficiency.  He had multiple previous DVTs.  No recent chest pain or shortness of breath.  No recent worsening to suggest a new DVT.  His legs are actually doing fairly well.  He is using the lymphedema pump once or twice a day and wears his compression socks daily.  He has significant discoloration on the medial left lower leg but this is not really changed.  No new ulceration or infection.  Current Outpatient Medications  Medication Sig Dispense Refill   albuterol  (VENTOLIN  HFA) 108 (90 Base) MCG/ACT inhaler Inhale 2 puffs into the lungs every 6 (six) hours as needed for wheezing or shortness of breath.     alendronate (FOSAMAX) 70 MG tablet Take 70 mg by mouth once a week.     apixaban  (ELIQUIS ) 5 MG TABS tablet Take 1 tablet (5 mg total) by mouth 2 (two) times daily. 180 tablet 1   ARIPiprazole  (ABILIFY ) 2 MG tablet Take 4 mg by mouth daily.     azelastine (ASTELIN) 0.1 % nasal spray 2 sprays 2 (two) times daily.     BENLYSTA 200 MG/ML SOSY      busPIRone  (BUSPAR ) 5 MG tablet Take 1 tablet (5 mg total) by mouth 2 (two) times daily. 180 tablet 0   carbidopa-levodopa (SINEMET IR) 25-100 MG tablet Take by mouth.     cholecalciferol  (VITAMIN D3) 25 MCG (1000 UNIT) tablet Take 1,000 Units by mouth daily.     ciprofloxacin  (CILOXAN ) 0.3 % ophthalmic solution Place 2 drops into both eyes every 2 (two) hours. Administer 1 drop, every 2 hours, while awake, for 2 days. Then 1 drop, every 4 hours, while awake, for the next 5 days. 5 mL 0   clonazePAM  (KLONOPIN ) 0.5 MG tablet Take 0.5 mg by mouth daily.     clopidogrel  (PLAVIX ) 75 MG tablet TAKE 1 TABLET(75 MG) BY MOUTH DAILY 90 tablet 0   cyanocobalamin   (VITAMIN B12) 1000 MCG/ML injection Vit B12 1000 mcg IM once a week for 3 weeks then once a month for 4 months. During and after loading dose with injection treatment patient should also take 1000 mcg by mouth once a day.     famotidine  (PEPCID ) 20 MG tablet TAKE 1 TABLET(20 MG) BY MOUTH TWICE DAILY 180 tablet 0   fluticasone  (FLONASE ) 50 MCG/ACT nasal spray Place 2 sprays into both nostrils daily. 16 g 6   gabapentin  (NEURONTIN ) 300 MG capsule Take 1 capsule (300 mg total) by mouth 2 (two) times daily. 60 capsule 2   hydrALAZINE  (APRESOLINE ) 25 MG tablet Take 1 tablet (25 mg total) by mouth 3 (three) times daily. 270 tablet 1   [START ON 11/05/2024] HYDROcodone -acetaminophen  (NORCO/VICODIN) 5-325 MG tablet Take 1 tablet by mouth every 8 (eight) hours as needed for severe pain (pain score 7-10). Must last 30 days 90 tablet 0   hydroxychloroquine (PLAQUENIL) 200 MG tablet Take 200 mg by mouth 2 (two) times daily.     hydrOXYzine  (ATARAX ) 25 MG tablet Take 25-50 mg by mouth at bedtime as needed.     loratadine  (CLARITIN ) 10 MG tablet Take 10 mg by mouth daily.     losartan  (COZAAR ) 25  MG tablet Take 25 mg by mouth daily.     meclizine  (ANTIVERT ) 12.5 MG tablet Take 1 tablet (12.5 mg total) by mouth 3 (three) times daily as needed for dizziness. 30 tablet 0   metoprolol  succinate (TOPROL -XL) 25 MG 24 hr tablet Take 1 tablet (25 mg total) by mouth daily. 90 tablet 3   midodrine  (PROAMATINE ) 10 MG tablet Take 10 mg by mouth 3 (three) times daily.     montelukast  (SINGULAIR ) 10 MG tablet Take 1 tablet (10 mg total) by mouth at bedtime. 90 tablet 1   Multiple Vitamins-Minerals (MULTIVITAMIN WITH MINERALS) tablet Take 1 tablet by mouth daily.     mycophenolate  (CELLCEPT ) 500 MG tablet Take 2 tablets (1,000 mg total) by mouth 2 (two) times daily. HOLD until neurosurgery is ok to restart this (waiting for incision to heal)     oxybutynin  (DITROPAN  XL) 15 MG 24 hr tablet Take 15 mg by mouth daily.      pantoprazole  (PROTONIX ) 20 MG tablet Take 1 tablet (20 mg total) by mouth 2 (two) times daily. 180 tablet 3   rosuvastatin  (CRESTOR ) 20 MG tablet Take 1 tablet (20 mg total) by mouth daily. 90 tablet 0   sertraline  (ZOLOFT ) 100 MG tablet Take 1.5 tablets (150 mg total) by mouth daily. 135 tablet 1   silodosin  (RAPAFLO ) 8 MG CAPS capsule Take 8 mg by mouth daily.     solifenacin  (VESICARE ) 5 MG tablet Take 5 mg by mouth daily.     Suzetrigine  (JOURNAVX ) 50 MG TABS Take 50 mg by mouth every 12 (twelve) hours as needed (chronic pain). 60 tablet 1   Syringe/Needle, Disp, (SYRINGE 3CC/25GX1) 25G X 1 3 ML MISC To be used with Vit B12 1000 mcg IM once a week for 4 weeks then once a month for 4 months. During and after loading dose with injection treatment patient should also take 1000 mcg by mouth once a day.     tadalafil  (CIALIS ) 20 MG tablet Take 1 tablet (20 mg total) by mouth daily as needed for erectile dysfunction. Avoid taking it if low blood pressure 30 tablet 7   tamsulosin  (FLOMAX ) 0.4 MG CAPS capsule Take 1 capsule (0.4 mg total) by mouth daily. 30 capsule 11   terbinafine  (LAMISIL ) 250 MG tablet Take 1 tablet (250 mg total) by mouth daily. 90 tablet 0   No current facility-administered medications for this visit.    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   Overview:  superior sagittal sinus, left transverse sinus and cortical veins    Closed left hip fracture, initial encounter (HCC) 02/18/2023   COPD (chronic obstructive pulmonary disease) (HCC)    COVID-19 virus infection 12/2020   Depression    DVT (deep venous thrombosis) (HCC)    Dyspnea    GERD (gastroesophageal reflux disease)    Head injury 11/05/2023   Heart attack (HCC)  11/05/2023   Heel spur, left 02/16/2019   Heel spur, right 02/16/2019   Hematoma of groin 11/08/2023   Herpes zoster infection of lumbar region 02/20/2020   History of DVT (deep vein thrombosis) 03/22/2020   History of kidney stones    HTN (hypertension)    Hyperlipidemia    Hypertension    Long  term current use of systemic steroids 11/08/2020   Lupus    Lymphedema 10/07/2018   Morbid obesity (HCC)    Opiate abuse, episodic (HCC) 02/26/2018   Osteoporosis    Pain and swelling of right lower extremity 02/03/2023   Pneumonia    PONV (postoperative nausea and vomiting)    Postphlebitic syndrome with ulcer, 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   Presence of IVC filter 03/22/2020   Removed   Pulmonary embolism (HCC)    Renal disorder    Stage III   Severe episode of recurrent major depressive disorder, 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   Shock circulatory (HCC) 11/06/2023   Sleep apnea    STEMI (ST elevation myocardial infarction) (HCC) 11/05/2023    Past Surgical History:  Procedure Laterality Date   ANKLE SURGERY Right    BACK SURGERY     BRONCHIAL WASHINGS N/A 11/01/2021   Procedure: BRONCHIAL WASHINGS;  Surgeon: Parris Manna, MD;  Location: [facility] ORS;  Service: Thoracic;  Laterality: N/A;   COLONOSCOPY WITH PROPOFOL  N/A 05/28/2020   Procedure: COLONOSCOPY WITH PROPOFOL ;  Surgeon: Therisa Bi, MD;  Location: [facility] ENDOSCOPY;  Service: Endoscopy;  Laterality: N/A;   COLONOSCOPY WITH PROPOFOL  N/A 07/01/2023   Procedure: COLONOSCOPY WITH PROPOFOL ;  Surgeon: Therisa Bi, MD;  Location: [facility] ENDOSCOPY;  Service: Gastroenterology;  Laterality: N/A;   CORONARY ULTRASOUND/IVUS N/A 11/05/2023   Procedure: Coronary Ultrasound/IVUS;  Surgeon: Mady Bruckner, MD;  Location: [facility] INVASIVE CV LAB;  Service: Cardiovascular;  Laterality: N/A;   CORONARY/GRAFT ACUTE MI REVASCULARIZATION N/A 11/05/2023   Procedure: Coronary/Graft Acute MI Revascularization;  Surgeon: Mady Bruckner, MD;   Location: [facility] INVASIVE CV LAB;  Service: Cardiovascular;  Laterality: N/A;   CYST EXCISION  92 or 93    Liver cyst removal [facility]   FLEXIBLE BRONCHOSCOPY N/A 11/01/2021   Procedure: FLEXIBLE BRONCHOSCOPY;  Surgeon: Parris Manna, MD;  Location: [facility] ORS;  Service: Thoracic;  Laterality: N/A;   FRACTURE SURGERY     HIP PINNING,CANNULATED Left 02/19/2023   Procedure: PERCUTANEOUS FIXATION OF FEMORAL NECK;  Surgeon: Tobie Priest, MD;  Location: [facility] ORS;  Service: Orthopedics;  Laterality: Left;   I & D EXTREMITY Right 04/29/2017   Procedure: IRRIGATION AND DEBRIDEMENT EXTREMITY;  Surgeon: Shelva Dunnings, MD;  Location: [facility] ORS;  Service: General;  Laterality: Right;   IRRIGATION AND DEBRIDEMENT ABSCESS Left 04/29/2017   Procedure: IRRIGATION AND DEBRIDEMENT Scrotal ABSCESS;  Surgeon: Shelva Dunnings, MD;  Location: [facility] ORS;  Service: General;  Laterality: Left;   LEFT HEART CATH AND CORONARY ANGIOGRAPHY N/A 11/05/2023   Procedure: LEFT HEART CATH AND CORONARY ANGIOGRAPHY;  Surgeon: Mady Bruckner, MD;  Location: [facility] INVASIVE CV LAB;  Service: Cardiovascular;  Laterality: N/A;   POLYPECTOMY  07/01/2023   Procedure: POLYPECTOMY;  Surgeon: Therisa Bi, MD;  Location: [facility] ENDOSCOPY;  Service: Gastroenterology;;   SPINE SURGERY       Social History   Tobacco Use   Smoking status: Never   Smokeless tobacco: Never  Vaping Use   Vaping status: Never Used  Substance Use Topics   Alcohol use: No   Drug use: No    Comment: PT DENIES      Family History  Problem Relation Age of Onset   Hypertension Father    Heart disease Father    Cancer Father    Clotting disorder Mother    Hearing loss Mother    Kidney disease Brother    Heart attack Maternal Grandmother    Heart attack Maternal Grandfather    Heart  attack Paternal Grandfather      Allergies  Allergen Reactions   Enalapril Other (See Comments)    Unknown reaction   Vicodin [Hydrocodone -Acetaminophen ] Hives and Rash     Severe headaches (also) NAME BRAND ONLY PER PT CAN TAKE GENERIC     REVIEW OF SYSTEMS (Negative unless checked)   Constitutional: [] Weight loss  [] Fever  [] Chills Cardiac: [] Chest pain   [] Chest pressure   [] Palpitations   [] Shortness of breath when laying flat   [] Shortness of breath at rest   [] Shortness of breath with exertion. Vascular:  [] Pain in legs with walking   [] Pain in legs at rest   [] Pain in legs when laying flat   [] Claudication   [] Pain in feet when walking  [] Pain in feet at rest  [] Pain in feet when laying flat   [x] History of DVT   [] Phlebitis   [x] Swelling in legs   [x] Varicose veins   [] Non-healing ulcers Pulmonary:   [] Uses home oxygen   [] Productive cough   [] Hemoptysis   [] Wheeze  [] COPD   [] Asthma Neurologic:  [] Dizziness  [] Blackouts   [] Seizures   [] History of stroke   [] History of TIA  [] Aphasia   [] Temporary blindness   [] Dysphagia   [] Weakness or numbness in arms   [] Weakness or numbness in legs Musculoskeletal:  [x] Arthritis   [] Joint swelling   [] Joint pain   [] Low back pain Hematologic:  [] Easy bruising  [] Easy bleeding   [] Hypercoagulable state   [] Anemic   Gastrointestinal:  [] Blood in stool   [] Vomiting blood  [] Gastroesophageal reflux/heartburn   [] Abdominal pain Genitourinary:  [x] Chronic kidney disease   [] Difficult urination  [] Frequent urination  [] Burning with urination   [] Hematuria Skin:  [] Rashes   [] Ulcers   [] Wounds Psychological:  [] History of anxiety   []  History of major depression.  Physical Examination  BP (!) 151/87   Pulse 65   Resp 18   Wt 220 lb 9.6 oz (100.1 kg)   BMI 27.57 kg/m  Gen:  WD/WN, NAD Head: [place]/AT, No temporalis wasting. Ear/Nose/Throat: Hearing grossly intact, nares w/o erythema or drainage Eyes: Conjunctiva clear. Sclera non-icteric Neck: Supple.  Trachea midline Pulmonary:  Good air movement, no use of accessory muscles.  Cardiac: RRR, no JVD Vascular:  Vessel Right Left  Radial Palpable Palpable        Musculoskeletal: M/S 5/5 throughout.  No deformity or atrophy.  Significant stasis dermatitis changes most prominent in the left lower medial leg.  Trace right lower extremity edema, trace to 1+ left lower extremity edema. Neurologic: Sensation grossly intact in extremities.  Symmetrical.  Speech is fluent.  Psychiatric: Judgment intact, Mood & affect appropriate for pt's clinical situation. Dermatologic: No rashes or ulcers noted.  No cellulitis or open wounds.      Labs Recent Results (from the past 2160 hours)  Compliance Drug Analysis, Ur     Status: None   Collection Time: 07/19/24  2:07 PM  Result Value Ref Range   Summary FINAL     Comment: ==================================================================== Compliance Drug Analysis, Ur ==================================================================== Test                             Result       Flag       Units  Drug Present and Declared for Prescription Verification   Metoprolol                      PRESENT  EXPECTED  Drug Present not Declared for Prescription Verification   Tramadol                        166          UNEXPECTED ng/mg creat   O-Desmethyltramadol            1291         UNEXPECTED ng/mg creat    Source of tramadol  is a prescription medication. O-desmethyltramadol    is an expected metabolite of tramadol .  Drug Absent but Declared for Prescription Verification   Clonazepam                      Not Detected UNEXPECTED ng/mg creat   Sertraline                      Not Detected UNEXPECTED   Aripiprazole                    Not Detected UNEXPECTED ==================================================================== Test                       Result    Flag   Units      Ref Range   Creatinine              94               mg/dL      >=79 ==================================================================== Declared Medications:  The flagging and interpretation on this report are based on the  following  declared medications.  Unexpected results may arise from  inaccuracies in the declared medications.   **Note: The testing scope of this panel includes these medications:   Aripiprazole  (Abilify )  Clonazepam  (Klonopin )  Metoprolol   Sertraline  (Zoloft )   **Note: The testing scope of this panel does not include the  following reported medications:   Apixaban  (Eliquis )  Azelastine (Astelin)  Belimumab (Benlysta)  Carbidopa (Sinemet)  Clopidogrel  (Plavix )  Levodopa (Sinemet)  Midodrine  (Proamatine )  Montelukast  (Singulair )  Multivitamin  Mycophenolate  mofetil (Cellcept )  Oxybutynin  (Ditropan )  Pantoprazole  (Protonix )  Rosuvastatin  (Crestor )  Solifenacin  (Vesicare )  Vitamin B12  Vitamin D3 ===== =============================================================== For clinical consultation, please call [phone]. ====================================================================   HgB A1c     Status: Abnormal   Collection Time: 07/28/24  3:31 PM  Result Value Ref Range   Hgb A1c MFr Bld 5.7 (H) <5.7 %    Comment: For someone without known diabetes, a hemoglobin  A1c value between 5.7% and 6.4% is consistent with prediabetes and should be confirmed with a  follow-up test. . For someone with known diabetes, a value <7% indicates that their diabetes is well controlled. A1c targets should be individualized based on duration of diabetes, age, comorbid conditions, and other considerations. . This assay result is consistent with an increased risk of diabetes. . Currently, no consensus exists regarding use of hemoglobin A1c for diagnosis of diabetes for children. .    Mean Plasma Glucose 117 mg/dL   eAG (mmol/L) 6.5 mmol/L  Comprehensive Metabolic Panel (CMET)     Status: Abnormal   Collection Time: 07/28/24  3:31 PM  Result Value Ref Range   Glucose, Bld 92 65 - 99 mg/dL    Comment: .            Fasting reference interval .    BUN 24 7 - 25 mg/dL   Creat 8.40 (H)  9.29 - 1.35 mg/dL  eGFR 49 (L) > OR = 60 mL/min/1.4m2   BUN/Creatinine Ratio 15 6 - 22 (calc)   Sodium 137 135 - 146 mmol/L   Potassium 4.1 3.5 - 5.3 mmol/L   Chloride 106 98 - 110 mmol/L   CO2 23 20 - 32 mmol/L   Calcium  9.2 8.6 - 10.3 mg/dL   Total Protein 7.0 6.1 - 8.1 g/dL   Albumin  4.1 3.6 - 5.1 g/dL   Globulin 2.9 1.9 - 3.7 g/dL (calc)   AG Ratio 1.4 1.0 - 2.5 (calc)   Total Bilirubin 0.6 0.2 - 1.2 mg/dL   Alkaline phosphatase (APISO) 98 35 - 144 U/L   AST 11 10 - 35 U/L   ALT 9 9 - 46 U/L  CBC with Differential/Platelet     Status: Abnormal   Collection Time: 07/28/24  3:31 PM  Result Value Ref Range   WBC 4.7 3.8 - 10.8 Thousand/uL   RBC 4.89 4.20 - 5.80 Million/uL   Hemoglobin 13.3 13.2 - 17.1 g/dL   HCT 56.8 61.4 - 49.9 %   MCV 88.1 80.0 - 100.0 fL   MCH 27.2 27.0 - 33.0 pg   MCHC 30.9 (L) 32.0 - 36.0 g/dL    Comment: For adults, a slight decrease in the calculated MCHC value (in the range of 30 to 32 g/dL) is most likely not clinically significant; however, it should be interpreted with caution in correlation with other red cell parameters and the patient's clinical condition.    RDW 14.4 11.0 - 15.0 %   Platelets 189 140 - 400 Thousand/uL   MPV 11.5 7.5 - 12.5 fL   Neutro Abs 2,829 1,500 - 7,800 cells/uL   Absolute Lymphocytes 1,180 850 - 3,900 cells/uL   Absolute Monocytes 564 200 - 950 cells/uL   Eosinophils Absolute 108 15 - 500 cells/uL   Basophils Absolute 19 0 - 200 cells/uL   Neutrophils Relative % 60.2 %   Total Lymphocyte 25.1 %   Monocytes Relative 12.0 %   Eosinophils Relative 2.3 %   Basophils Relative 0.4 %  Vitamin B12     Status: None   Collection Time: 07/28/24  3:31 PM  Result Value Ref Range   Vitamin B-12 796 200 - 1,100 pg/mL  Iron, TIBC and Ferritin Panel     Status: Abnormal   Collection Time: 07/28/24  3:31 PM  Result Value Ref Range   Iron 51 50 - 180 mcg/dL   TIBC 654 749 - 574 mcg/dL (calc)   %SAT 15 (L) 20 - 48 % (calc)    Ferritin 23 (L) 24 - 380 ng/mL  Basic metabolic panel with GFR     Status: Abnormal   Collection Time: 10/06/24  8:50 AM  Result Value Ref Range   Glucose 90 70 - 99 mg/dL   BUN 23 8 - 27 mg/dL   Creatinine, Ser 8.44 (H) 0.76 - 1.27 mg/dL   eGFR 50 (L) >40 fO/fpw/8.26   BUN/Creatinine Ratio 15 10 - 24   Sodium 140 134 - 144 mmol/L   Potassium 4.7 3.5 - 5.2 mmol/L   Chloride 103 96 - 106 mmol/L   CO2 19 (L) 20 - 29 mmol/L   Calcium  9.5 8.6 - 10.2 mg/dL  CBC     Status: Abnormal   Collection Time: 10/06/24  8:50 AM  Result Value Ref Range   WBC 3.3 (L) 3.4 - 10.8 x10E3/uL   RBC 5.19 4.14 - 5.80 x10E6/uL   Hemoglobin 15.1 13.0 - 17.7  g/dL   Hematocrit 51.2 62.4 - 51.0 %   MCV 94 79 - 97 fL   MCH 29.1 26.6 - 33.0 pg   MCHC 31.0 (L) 31.5 - 35.7 g/dL   RDW 84.4 (H) 88.3 - 84.5 %   Platelets 136 (L) 150 - 450 x10E3/uL  Hepatic Function Panel     Status: Abnormal   Collection Time: 10/13/24 10:21 AM  Result Value Ref Range   Total Protein 6.9 6.0 - 8.5 g/dL   Albumin  4.1 3.9 - 4.9 g/dL   Bilirubin Total 0.7 0.0 - 1.2 mg/dL   Bilirubin, Direct 9.74 0.00 - 0.40 mg/dL   Alkaline Phosphatase 128 (H) 47 - 123 IU/L   AST 15 0 - 40 IU/L   ALT 13 0 - 44 IU/L    Radiology No results found.  Assessment/Plan  Lymphedema Symptoms are currently well-controlled.  Referral to a lymphedema clinic has been put in.  Continue to use the lymphedema pump once to twice daily.  Continue use compression socks every day.  Elevate and exercise.  Follow-up in 6 months.  Benign essential hypertension blood pressure control important in reducing the progression of atherosclerotic disease. On appropriate oral medications.     Stage 3a chronic kidney disease (HCC) May worsen LE swelling   Mixed hyperlipidemia lipid control important in reducing the progression of atherosclerotic disease. Continue statin therapy  Selinda Gu, MD  10/14/2024 10:52 AM    This note was created with Dragon  medical transcription system.  Any errors from dictation are purely unintentional

## 2024-10-17 ENCOUNTER — Ambulatory Visit: Admitting: Physical Therapy

## 2024-10-17 DIAGNOSIS — M5459 Other low back pain: Secondary | ICD-10-CM | POA: Diagnosis not present

## 2024-10-17 DIAGNOSIS — M542 Cervicalgia: Secondary | ICD-10-CM

## 2024-10-17 DIAGNOSIS — R42 Dizziness and giddiness: Secondary | ICD-10-CM

## 2024-10-17 DIAGNOSIS — R2681 Unsteadiness on feet: Secondary | ICD-10-CM | POA: Diagnosis not present

## 2024-10-17 DIAGNOSIS — M5416 Radiculopathy, lumbar region: Secondary | ICD-10-CM

## 2024-10-17 DIAGNOSIS — I951 Orthostatic hypotension: Secondary | ICD-10-CM | POA: Diagnosis not present

## 2024-10-17 DIAGNOSIS — R262 Difficulty in walking, not elsewhere classified: Secondary | ICD-10-CM | POA: Diagnosis not present

## 2024-10-17 DIAGNOSIS — M5412 Radiculopathy, cervical region: Secondary | ICD-10-CM | POA: Diagnosis not present

## 2024-10-17 DIAGNOSIS — I890 Lymphedema, not elsewhere classified: Secondary | ICD-10-CM | POA: Diagnosis not present

## 2024-10-17 NOTE — Therapy (Signed)
 OUTPATIENT PHYSICAL THERAPY TREATMENT  Patient Name: Jonathon Snow MRN: [id number] DOB:Feb 03, 1961, 63 y.o., male Today's Date: 10/17/2024  PCP: [name], PA-C REFERRING PROVIDER: [name], PA-C  END OF SESSION:  PT End of Session - 10/17/24 0759     Visit Number 12    Number of Visits 16    Date for Recertification  11/02/24    Authorization Type [place] Medicaid    Authorization Time Period 09/07/24-11/02/24    Progress Note Due on Visit 20    PT Start Time 0805    PT Stop Time 0845    PT Time Calculation (min) 40 min    Equipment Utilized During Treatment Gait belt    Activity Tolerance Patient tolerated treatment well;No increased pain;Patient limited by pain    Behavior During Therapy [facility] for tasks assessed/performed          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   Overview:  superior sagittal sinus, left transverse sinus and cortical veins    Closed left hip fracture, initial encounter (HCC) 02/18/2023   COPD (chronic obstructive pulmonary disease) (HCC)    COVID-19 virus infection 12/2020   Depression    DVT (deep venous thrombosis) (HCC)    Dyspnea    GERD (gastroesophageal reflux disease)    Head injury 11/05/2023   Heart attack (HCC) 11/05/2023   Heel spur, left 02/16/2019   Heel spur, right 02/16/2019   Hematoma of groin 11/08/2023   Herpes zoster infection of lumbar region 02/20/2020   History of DVT (deep vein thrombosis) 03/22/2020   History of kidney stones    HTN (hypertension)    Hyperlipidemia    Hypertension    Long term current use of systemic steroids 11/08/2020   Lupus    Lymphedema 10/07/2018   Morbid obesity (HCC)    Opiate abuse, episodic (HCC) 02/26/2018   Osteoporosis    Pain  and swelling of right lower extremity 02/03/2023   Pneumonia    PONV (postoperative nausea and vomiting)    Postphlebitic syndrome with ulcer, 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   Presence of IVC filter 03/22/2020   Removed   Pulmonary embolism (HCC)    Renal disorder    Stage III   Severe episode of recurrent major depressive disorder, 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   Shock circulatory (HCC) 11/06/2023   Sleep apnea    STEMI (ST elevation myocardial infarction) (HCC) 11/05/2023   Past Surgical History:  Procedure Laterality Date   ANKLE SURGERY Right    BACK SURGERY     BRONCHIAL WASHINGS N/A 11/01/2021   Procedure: BRONCHIAL WASHINGS;  Surgeon: Parris Manna, MD;  Location: [facility] ORS;  Service: Thoracic;  Laterality: N/A;   COLONOSCOPY WITH PROPOFOL  N/A 05/28/2020   Procedure: COLONOSCOPY WITH PROPOFOL ;  Surgeon: Therisa Bi, MD;  Location: [facility] ENDOSCOPY;  Service: Endoscopy;  Laterality: N/A;   COLONOSCOPY WITH PROPOFOL  N/A 07/01/2023   Procedure: COLONOSCOPY WITH PROPOFOL ;  Surgeon: Therisa Bi, MD;  Location: [facility] ENDOSCOPY;  Service: Gastroenterology;  Laterality: N/A;   CORONARY ULTRASOUND/IVUS N/A 11/05/2023   Procedure: Coronary Ultrasound/IVUS;  Surgeon: Mady Bruckner, MD;  Location: [facility] INVASIVE CV LAB;  Service: Cardiovascular;  Laterality: N/A;   CORONARY/GRAFT ACUTE MI REVASCULARIZATION N/A 11/05/2023   Procedure: Coronary/Graft  Acute MI Revascularization;  Surgeon: Mady Bruckner, MD;  Location: [facility] INVASIVE CV LAB;  Service: Cardiovascular;  Laterality: N/A;   CYST EXCISION  92 or 93    Liver cyst removal [facility]   FLEXIBLE BRONCHOSCOPY N/A 11/01/2021   Procedure: FLEXIBLE BRONCHOSCOPY;  Surgeon: Parris Manna, MD;  Location: [facility] ORS;  Service: Thoracic;  Laterality: N/A;   FRACTURE SURGERY     HIP PINNING,CANNULATED Left 02/19/2023   Procedure: PERCUTANEOUS FIXATION OF FEMORAL NECK;  Surgeon: Tobie Priest, MD;  Location: [facility] ORS;  Service: Orthopedics;   Laterality: Left;   I & D EXTREMITY Right 04/29/2017   Procedure: IRRIGATION AND DEBRIDEMENT EXTREMITY;  Surgeon: Shelva Dunnings, MD;  Location: [facility] ORS;  Service: General;  Laterality: Right;   IRRIGATION AND DEBRIDEMENT ABSCESS Left 04/29/2017   Procedure: IRRIGATION AND DEBRIDEMENT Scrotal ABSCESS;  Surgeon: Shelva Dunnings, MD;  Location: [facility] ORS;  Service: General;  Laterality: Left;   LEFT HEART CATH AND CORONARY ANGIOGRAPHY N/A 11/05/2023   Procedure: LEFT HEART CATH AND CORONARY ANGIOGRAPHY;  Surgeon: Mady Bruckner, MD;  Location: [facility] INVASIVE CV LAB;  Service: Cardiovascular;  Laterality: N/A;   POLYPECTOMY  07/01/2023   Procedure: POLYPECTOMY;  Surgeon: Therisa Bi, MD;  Location: [facility] ENDOSCOPY;  Service: Gastroenterology;;   SPINE SURGERY     Patient Active Problem List   Diagnosis Date Noted   B12 deficiency 07/28/2024   Vitamin D  deficiency 07/28/2024   Cervical radicular pain 05/26/2024   History of thoracic spinal fusion (T7-T12) 05/26/2024   History of lumbar spinal fusion (L5-S1) 05/26/2024   Chronic pain syndrome 05/26/2024   Anxiety and depression 02/26/2024   Dyslipidemia 02/26/2024   Peripheral neuropathy 02/26/2024   Essential hypertension 02/26/2024   Vertigo 02/26/2024   Orthostatic hypotension 02/11/2024   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) 01/20/2024   Anxiety 12/14/2023   Overweight (BMI 25.0-29.9) 11/23/2023   Chronic heart failure with preserved ejection fraction (HFpEF) (HCC) 11/23/2023   BPH (benign prostatic hyperplasia) 11/23/2023   Unstable angina (HCC) 11/10/2023   Closed T10 spinal fracture (HCC) 11/06/2023   Closed tricolumnar fracture of thoracic vertebra (HCC) 11/06/2023   Thoracic spine instability 11/06/2023   Chronic bilateral low back pain with left-sided sciatica 07/07/2023   Neuropathy 07/07/2023   Hx of colonic polyps 07/01/2023   Adenomatous polyp of colon 07/01/2023   Erectile disorder  01/22/2023   Coronary artery disease involving native coronary artery of native heart without angina pectoris 09/09/2022   Varicose 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 01/03/2022   Immunocompromised state due to drug therapy 09/18/2021   Prediabetes 11/08/2020   Stage 3a chronic kidney disease (HCC) 11/08/2020   Seasonal allergies 11/08/2020   Rhinosinusitis 11/08/2020   Anticoagulant disorder 11/07/2020   Senile purpura 11/07/2020   MDD (major depressive disorder), recurrent episode, mild 09/12/2019   Other spondylosis with radiculopathy, lumbar region 02/16/2019   Osteoporosis 10/12/2018   Chronic venous insufficiency 10/07/2018   Lymphedema 10/07/2018   SLE glomerulonephritis syndrome, 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HCC) 03/03/2018   Syncope and collapse 12/29/2017   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) 10/29/2016   Hyperlipidemia LDL goal <70 01/15/2016   Obesity (BMI 30.0-34.9) 01/15/2016   Long term current use of anticoagulant 10/04/2015   Systemic lupus erythematosus (HCC) 05/30/2015   COPD, moderate (HCC) 06/27/2014   History of pulmonary embolism 12/11/2013   Nodule of right lung 12/11/2013   Cerebral venous sinus thrombosis 08/21/2013  Cystic disease of liver 08/21/2013   ONSET DATE: November 2024   REFERRING DIAG: imbalance  THERAPY DIAG:  Syncope due to orthostatic hypotension  Unsteadiness on feet  Cervicalgia  Other low back pain  Difficulty in walking, not elsewhere classified  Radiculopathy, lumbar region  Dizziness and giddiness  Radiculopathy, cervical region  Rationale for Evaluation and Treatment: Rehabilitation  SUBJECTIVE:                                                                                                                                                                                            SUBJECTIVE STATEMENT:   Pt states that he had an uneventful weekend.  Was unable  to go to [place] over the weekend due to transportation issues. Reports that she upper back and  nexk are hurting more today, but the lower back is feeling better. Did not benefit from soft tissue treatment at last session.  PERTINENT HISTORY:   62yoM referred to OPPT for imbalance. Pt just finished OPPT for post surgical neck pain on 08/30/24 at [facility] so he could begin his balance therapy here. Pt has had issues with chronic orthostatic hypotension repeated syncope, fluctuating intolerance to upright. Pt has become quite good at managing his safety at home with symptoms fluctuations. PT checks BP daily, has PRN meds for excessively low or high pressures.   PAIN:  Are you having pain? 7/10  neck/shoulders; 7/10 low back   PRECAUTIONS: Fall  WEIGHT BEARING RESTRICTIONS: No  FALLS: Has patient fallen in last 6 months? Yes 1x in March 2025  LIVING ENVIRONMENT: Lives with: Alone  Lives in: 1 level  Stairs: threshold only Has following equipment at home: RW, canes, walking sticks.   PLOF: 2 years ago was able to go hiking for pleasure ab lib; used to get in and out kayak and do multi day treks overnight with kayak.   PATIENT GOALS: improve tolerance to upright mobility, stop falling as much   OBJECTIVE:  Note: Objective measures were completed at evaluation unless otherwise noted.   FUNCTIONAL TESTS:  TUG: 34sec (armless chair)  5xSTS: 54.9sec with BUE heavy use arm chair  : 14.63 Normal stance eyes closed: 9/22: 5-10sec for 3 bouts  Narrow stance eyes closed: 9/22: 17-18 sec for 3 bouts   PATIENT SURVEYS:  ABC Scale: 47.5%  TREATMENT DATE 10/17/24:    10/27:   -overground AMB x633ft (increased today) upwalker:   -overground AMB x444ft (increased today) without AD.  *seated rest  *AMB to wellzone, no device  -leg press, seat 12, 3x12,  plate 4 (increased previous session)  *AMB back to rehab area no AD   -standing marching x15 bil BUE support on plinth -standing heel raises/toe raise x 15, BUE support on plinth -side stepping at elevated mat for UE support 62ft x 5 bil  -partial squat x 12 with UE supported on elevated mat.    10/24:  Amb overground 2 x 492ft. With upright RW Sit<>stand throughout session with supervision assist and heavy UE support. X 10  Seated:  LAQ x 10 BTB HS curl x 12 BTB Clam shell x 10 BTB  Sit<>supine with supervision assist from PT Heat applied to cspine and low back in semireclined position Gentle STM to superior Cspine and suboccipital region x 8 min with slow gentle increased pressure; pt noted to have decreased sensitivity with increased time allowing slight pressure with STM, but reports mild increased nausea after 7-8 minutes.   After 1 min rest, reports decreased nausea.   Upon completion of STM pt reports slight improved cervical ROM. But no change in pain.    10/22:  Physical Performance Testing: See goals for details    10/03/24 -heat applied to neck/back due to acute pain exacerbation today  -attempted manual cervical traction 25sec, limited by guarding, pain -towel roll traction in slight recliner x5 minutes -AMB overground with upwalker: 320ft in 5m46s   09/28/24 -overground AMB x611ft (increased today) upwalker: 48m46s, pain stable *seated rest -overground AMB x615ft (increased today) upwalker: 42m53s *seated rest   *AMB to wellzone, no device  -leg press, seat 12, 1x15, plate 4 (increased previous session) -standing marching x40 (20 per leg) BUE support on leg press (increased reps today)  -standing heel raises x25, BUE support on leg press -leg press, seat 12, 1x15, plate 4 (increased previous session) -standing marching x40 (20 per leg) BUE support on leg press (increased reps today)  -standing heel raises x25, BUE support on leg press  *AMB back to rehab  area  -Side stepping in // bars for motor control training x3 each -backward stepping // bars for motor control training x6 -forward stepup, retor step down on airex pad, hands-free when able (in // bars)   PATIENT EDUCATION: Education details: activity modification when neck is flared up  Person educated: Firefighter method: Discussion, handout Education comprehension: Fair   HOME EXERCISE PROGRAM: Access Code: [email or web address] URL: [email or web address] Date: 09/12/2024 Prepared by: Massie Dollar  Exercises - sit to stand, arms crossed, 28-30 inch surface   - 3 x daily - 1 sets - 8 reps - Standing Near Stance in Corner with Eyes Closed  - 3 x daily - 1 sets - 5 reps - 30 hold - Semi-Tandem Balance at Counter Top Eyes Open  - 1 x daily - 7 x weekly - 3 sets - 4 reps - 15 hold - Standing March with Counter Support  - 1 x daily - 7 x weekly - 3 sets - 10 reps - Seated Long Arc Quad  - 1 x daily - 7 x weekly - 3 sets - 10 reps - Backward Walking with Counter Support  - 1 x daily - 7 x weekly - 3 sets - 5 reps  GOALS: Goals reviewed with patient? No  SHORT TERM GOALS: Target date: 10/07/24  Pt to report no falls in most recent 4 weeks to indicate good safety practices and symptoms management in the home.  Baseline: reports orthostatic s/s roughly 2x per week.  10/12/24: pt denies any falls in the last 4 weeks Goal status: MET  2.  Pt will demonstrate improvement in 5xSTS performance by >3 seconds.  Baseline: 54.9sec with BUE heavy use arm chair  10/12/24: 43.01 sec Goal status: INITIAL  3.  Pt to demonstrate improvement in by 20% and backwards by 25%  Baseline: 9/22: forward: 14.63 sec (0.55m/s). Backward: 49.04 sec 0.87m/s  10/12/24: 15.21 sec (0.66 m/s); 45.77 sec (0.22 m/s) Goal status: INITIAL   LONG TERM GOALS: Target date: 11/07/24  Pt to demonstrate tolerance of >1027ft sustained AMB c LRAD without presyncope, without LOB, and without  increase in back pain >2/10 points NPRS.  Baseline: tolerated 369ft with mild-moderate increase in L hip and low back pain;  09/26/24: 438ft with Upwalker, 58m53s (no increased pain in back/hip)  10/12/24: 793' with Upwalker, 6 min (+2/10 pain increase in the lumbar region) Goal status: PROGRESSING  2.  5xSTS performance, standard chair, hands free <20sec; hands free from 19.5 surface height in <12sec.   Baseline:  54.9sec with BUE heavy use arm chair  10/12/24: 43.01 sec Goal status: INITIAL  3.  Eye closed in narrow stance x60sec (5x without LOB and without need for hand support).  Baseline: 4-10 sec  10/12/24: 42 sec Goal status: PROGRESSING  ASSESSMENT:  CLINICAL IMPRESSION:  Pt responded well to interventions. PT treatment focused on BLE strengthening  and functional movement patterns. Multiple therapeutic rest breaks throughout session due to BLE fatigue and low/upper back pain. Was required to have decreased repetitions, due to pain compared to last session with strength training.  Pt will continue to benefit from skilled therapy to address remaining deficits in order to improve overall QoL and return to PLOF.      OBJECTIVE IMPAIRMENTS: Decreased knowledge of condition, decreased use of DME, decreased mobility, difficulty walking, decreased strength, decreased ROM. ACTIVITY LIMITATIONS: Lifting, standing, walking, squatting, transfers, locomotion level PARTICIPATION LIMITATIONS: Cleaning, laundry, interpersonal relationships, driving, yardwork, community activity.  PERSONAL FACTORS: Age, behavior pattern, education, past/current experiences, transportation, profession  are also affecting patient's functional outcome.  REHAB POTENTIAL: Good CLINICAL DECISION MAKING: Medium  EVALUATION COMPLEXITY: Moderate    PLAN:  PT FREQUENCY: 1-2x/week PT DURATION: 8 weeks PLANNED INTERVENTIONS: 97110-Therapeutic exercises, 97530- Therapeutic activity, 97112- Neuromuscular re-education,  [phone]- Self Care, 02859- Manual therapy, [phone]- Gait training, [phone]- Electrical stimulation (unattended), [phone]- Electrical stimulation (manual), Patient/Family education, Balance training, Stair training, Cryotherapy, and Moist heat  PLAN FOR NEXT SESSION:    -continued leg press, overground balance training with walking sparingly due to easily provoked back issues -continue overground AMB of 4-5 laps with upwalker, start adding 2-3lb AW    Massie Dollar PT, DPT  Physical Therapist - [place]  [facility]  8:00 AM 10/17/24

## 2024-10-18 ENCOUNTER — Ambulatory Visit (INDEPENDENT_AMBULATORY_CARE_PROVIDER_SITE_OTHER): Admitting: Vascular Surgery

## 2024-10-18 ENCOUNTER — Encounter (INDEPENDENT_AMBULATORY_CARE_PROVIDER_SITE_OTHER): Payer: Self-pay

## 2024-10-18 DIAGNOSIS — M12811 Other specific arthropathies, not elsewhere classified, right shoulder: Secondary | ICD-10-CM | POA: Diagnosis not present

## 2024-10-18 DIAGNOSIS — G8929 Other chronic pain: Secondary | ICD-10-CM | POA: Diagnosis not present

## 2024-10-18 DIAGNOSIS — M25512 Pain in left shoulder: Secondary | ICD-10-CM | POA: Diagnosis not present

## 2024-10-18 DIAGNOSIS — M12812 Other specific arthropathies, not elsewhere classified, left shoulder: Secondary | ICD-10-CM | POA: Diagnosis not present

## 2024-10-18 DIAGNOSIS — M25511 Pain in right shoulder: Secondary | ICD-10-CM | POA: Diagnosis not present

## 2024-10-18 DIAGNOSIS — M3214 Glomerular disease in systemic lupus erythematosus: Secondary | ICD-10-CM | POA: Diagnosis not present

## 2024-10-18 DIAGNOSIS — N1831 Chronic kidney disease, stage 3a: Secondary | ICD-10-CM | POA: Diagnosis not present

## 2024-10-18 DIAGNOSIS — I10 Essential (primary) hypertension: Secondary | ICD-10-CM | POA: Diagnosis not present

## 2024-10-18 DIAGNOSIS — R809 Proteinuria, unspecified: Secondary | ICD-10-CM | POA: Diagnosis not present

## 2024-10-19 ENCOUNTER — Ambulatory Visit

## 2024-10-19 ENCOUNTER — Encounter: Payer: Self-pay | Admitting: Nurse Practitioner

## 2024-10-19 ENCOUNTER — Ambulatory Visit: Admitting: Physical Therapy

## 2024-10-19 ENCOUNTER — Other Ambulatory Visit: Payer: Self-pay

## 2024-10-19 NOTE — Patient Instructions (Signed)
 Visit Information  Jonathon Snow was given information about Medicaid Managed Care team care coordination services as a part of their Healthy [facility] Medicaid benefit. Jonathon Snow   If you would like to schedule transportation through your [facility] plan, please call the following number at least 2 days in advance of your appointment: [phone]  For information about your ride after you set it up, call Ride Assist at [phone]. Use this number to activate a Will Call pickup, or if your transportation is late for a scheduled pickup. Use this number, too, if you need to make a change or cancel a previously scheduled reservation.  If you need transportation services right away, call [phone]. The after-hours call center is staffed 24 hours to handle ride assistance and urgent reservation requests (including discharges) 365 days a year. Urgent trips include sick visits, hospital discharge requests and life-sustaining treatment.  Call the [employer] Line at [phone], at any time, 24 hours a day, 7 days a week. If you are in danger or need immediate medical attention call 911.   Please see education materials related to hypertension, COPD and anxiety provided by MyChart link.  Care plan and visit instructions communicated with the patient verbally today. Patient agrees to receive a copy in MyChart. Active MyChart status and patient understanding of how to access instructions and care plan via MyChart confirmed with patient.     Telephone follow up appointment with Managed Medicaid care management team member scheduled for: 11-15-2024 at 2:00 PM  [name] RN, BSN  [place] [facility] RN Case Manager   Direct [phone]   Following is a copy of your plan of care:  There are no care plans that you recently modified to display for this patient.

## 2024-10-19 NOTE — Progress Notes (Signed)
 Recommend lovenox  bridge prior to patient's surgery which is planned for mid- November.

## 2024-10-19 NOTE — Progress Notes (Signed)
 Called patient to inform him that we have received a message from Tinnie Dawn NP at the cancer center, they will manage Lovenex bridge.

## 2024-10-20 ENCOUNTER — Ambulatory Visit

## 2024-10-20 ENCOUNTER — Ambulatory Visit: Payer: Self-pay | Admitting: Family Medicine

## 2024-10-20 ENCOUNTER — Ambulatory Visit (INDEPENDENT_AMBULATORY_CARE_PROVIDER_SITE_OTHER)

## 2024-10-20 DIAGNOSIS — I951 Orthostatic hypotension: Secondary | ICD-10-CM

## 2024-10-20 DIAGNOSIS — R42 Dizziness and giddiness: Secondary | ICD-10-CM | POA: Diagnosis not present

## 2024-10-20 DIAGNOSIS — R2681 Unsteadiness on feet: Secondary | ICD-10-CM | POA: Diagnosis not present

## 2024-10-20 DIAGNOSIS — M5416 Radiculopathy, lumbar region: Secondary | ICD-10-CM | POA: Diagnosis not present

## 2024-10-20 DIAGNOSIS — R262 Difficulty in walking, not elsewhere classified: Secondary | ICD-10-CM | POA: Diagnosis not present

## 2024-10-20 DIAGNOSIS — M5412 Radiculopathy, cervical region: Secondary | ICD-10-CM | POA: Diagnosis not present

## 2024-10-20 DIAGNOSIS — R195 Other fecal abnormalities: Secondary | ICD-10-CM | POA: Diagnosis not present

## 2024-10-20 DIAGNOSIS — M542 Cervicalgia: Secondary | ICD-10-CM | POA: Diagnosis not present

## 2024-10-20 DIAGNOSIS — M5459 Other low back pain: Secondary | ICD-10-CM

## 2024-10-20 DIAGNOSIS — I890 Lymphedema, not elsewhere classified: Secondary | ICD-10-CM

## 2024-10-20 LAB — POC HEMOCCULT BLD/STL (HOME/3-CARD/SCREEN)
Card #2 Fecal Occult Blod, POC: NEGATIVE
Card #3 Fecal Occult Blood, POC: NEGATIVE
Fecal Occult Blood, POC: NEGATIVE

## 2024-10-20 NOTE — Therapy (Signed)
 OUTPATIENT PHYSICAL THERAPY TREATMENT  Patient Name: Jonathon Snow MRN: [id number] DOB:1961-06-23, 63 y.o., male Today's Date: 10/20/2024  PCP: [name], PA-C REFERRING PROVIDER: Maree Jannett POUR, MD  END OF SESSION:  PT End of Session - 10/20/24 0848     Visit Number 13    Number of Visits 16    Date for Recertification  11/02/24    Authorization Type [place] [employer]    Authorization Time Period 09/07/24-11/02/24    Progress Note Due on Visit 20    PT Start Time 0848    PT Stop Time 0930    PT Time Calculation (min) 42 min    Equipment Utilized During Treatment Gait belt    Activity Tolerance Patient tolerated treatment well;No increased pain;Patient limited by pain    Behavior During Therapy [facility] for tasks assessed/performed           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   Overview:  superior sagittal sinus, left transverse sinus and cortical veins    Closed left hip fracture, initial encounter (HCC) 02/18/2023   COPD (chronic obstructive pulmonary disease) (HCC)    COVID-19 virus infection 12/2020   Depression    DVT (deep venous thrombosis) (HCC)    Dyspnea    GERD (gastroesophageal reflux disease)    Head injury 11/05/2023   Heart attack (HCC) 11/05/2023   Heel spur, left 02/16/2019   Heel spur, right 02/16/2019   Hematoma of groin 11/08/2023   Herpes zoster infection of lumbar region 02/20/2020   History of DVT (deep vein thrombosis) 03/22/2020   History of kidney stones    HTN (hypertension)    Hyperlipidemia    Hypertension    Long term current use of systemic steroids 11/08/2020   Lupus    Lymphedema 10/07/2018   Morbid obesity (HCC)    Opiate abuse, episodic (HCC) 02/26/2018   Osteoporosis    Pain  and swelling of right lower extremity 02/03/2023   Pneumonia    PONV (postoperative nausea and vomiting)    Postphlebitic syndrome with ulcer, 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   Presence of IVC filter 03/22/2020   Removed   Pulmonary embolism (HCC)    Renal disorder    Stage III   Severe episode of recurrent major depressive disorder, 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   Shock circulatory (HCC) 11/06/2023   Sleep apnea    STEMI (ST elevation myocardial infarction) (HCC) 11/05/2023   Past Surgical History:  Procedure Laterality Date   ANKLE SURGERY Right    BACK SURGERY     BRONCHIAL WASHINGS N/A 11/01/2021   Procedure: BRONCHIAL WASHINGS;  Surgeon: Parris Manna, MD;  Location: [facility] ORS;  Service: Thoracic;  Laterality: N/A;   COLONOSCOPY WITH PROPOFOL  N/A 05/28/2020   Procedure: COLONOSCOPY WITH PROPOFOL ;  Surgeon: Therisa Bi, MD;  Location: [facility] ENDOSCOPY;  Service: Endoscopy;  Laterality: N/A;   COLONOSCOPY WITH PROPOFOL  N/A 07/01/2023   Procedure: COLONOSCOPY WITH PROPOFOL ;  Surgeon: Therisa Bi, MD;  Location: [facility] ENDOSCOPY;  Service: Gastroenterology;  Laterality: N/A;   CORONARY ULTRASOUND/IVUS N/A 11/05/2023   Procedure: Coronary Ultrasound/IVUS;  Surgeon: Mady Bruckner, MD;  Location: [facility] INVASIVE CV LAB;  Service: Cardiovascular;  Laterality: N/A;   CORONARY/GRAFT ACUTE MI REVASCULARIZATION N/A 11/05/2023  Procedure: Coronary/Graft Acute MI Revascularization;  Surgeon: Mady Bruckner, MD;  Location: [facility] INVASIVE CV LAB;  Service: Cardiovascular;  Laterality: N/A;   CYST EXCISION  92 or 93    Liver cyst removal [facility]   FLEXIBLE BRONCHOSCOPY N/A 11/01/2021   Procedure: FLEXIBLE BRONCHOSCOPY;  Surgeon: Parris Manna, MD;  Location: [facility] ORS;  Service: Thoracic;  Laterality: N/A;   FRACTURE SURGERY     HIP PINNING,CANNULATED Left 02/19/2023   Procedure: PERCUTANEOUS FIXATION OF FEMORAL NECK;  Surgeon: Tobie Priest, MD;  Location: [facility] ORS;  Service: Orthopedics;   Laterality: Left;   I & D EXTREMITY Right 04/29/2017   Procedure: IRRIGATION AND DEBRIDEMENT EXTREMITY;  Surgeon: Shelva Dunnings, MD;  Location: [facility] ORS;  Service: General;  Laterality: Right;   IRRIGATION AND DEBRIDEMENT ABSCESS Left 04/29/2017   Procedure: IRRIGATION AND DEBRIDEMENT Scrotal ABSCESS;  Surgeon: Shelva Dunnings, MD;  Location: [facility] ORS;  Service: General;  Laterality: Left;   LEFT HEART CATH AND CORONARY ANGIOGRAPHY N/A 11/05/2023   Procedure: LEFT HEART CATH AND CORONARY ANGIOGRAPHY;  Surgeon: Mady Bruckner, MD;  Location: [facility] INVASIVE CV LAB;  Service: Cardiovascular;  Laterality: N/A;   POLYPECTOMY  07/01/2023   Procedure: POLYPECTOMY;  Surgeon: Therisa Bi, MD;  Location: [facility] ENDOSCOPY;  Service: Gastroenterology;;   SPINE SURGERY     Patient Active Problem List   Diagnosis Date Noted   B12 deficiency 07/28/2024   Vitamin D  deficiency 07/28/2024   Cervical radicular pain 05/26/2024   History of thoracic spinal fusion (T7-T12) 05/26/2024   History of lumbar spinal fusion (L5-S1) 05/26/2024   Chronic pain syndrome 05/26/2024   Anxiety and depression 02/26/2024   Dyslipidemia 02/26/2024   Peripheral neuropathy 02/26/2024   Essential hypertension 02/26/2024   Vertigo 02/26/2024   Orthostatic hypotension 02/11/2024   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) 01/20/2024   Anxiety 12/14/2023   Overweight (BMI 25.0-29.9) 11/23/2023   Chronic heart failure with preserved ejection fraction (HFpEF) (HCC) 11/23/2023   BPH (benign prostatic hyperplasia) 11/23/2023   Unstable angina (HCC) 11/10/2023   Closed T10 spinal fracture (HCC) 11/06/2023   Closed tricolumnar fracture of thoracic vertebra (HCC) 11/06/2023   Thoracic spine instability 11/06/2023   Chronic bilateral low back pain with left-sided sciatica 07/07/2023   Neuropathy 07/07/2023   Hx of colonic polyps 07/01/2023   Adenomatous polyp of colon 07/01/2023   Erectile disorder  01/22/2023   Coronary artery disease involving native coronary artery of native heart without angina pectoris 09/09/2022   Varicose 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 01/03/2022   Immunocompromised state due to drug therapy 09/18/2021   Prediabetes 11/08/2020   Stage 3a chronic kidney disease (HCC) 11/08/2020   Seasonal allergies 11/08/2020   Rhinosinusitis 11/08/2020   Anticoagulant disorder 11/07/2020   Senile purpura 11/07/2020   MDD (major depressive disorder), recurrent episode, mild 09/12/2019   Other spondylosis with radiculopathy, lumbar region 02/16/2019   Osteoporosis 10/12/2018   Chronic venous insufficiency 10/07/2018   Lymphedema 10/07/2018   SLE glomerulonephritis syndrome, 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HCC) 03/03/2018   Syncope and collapse 12/29/2017   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) 10/29/2016   Hyperlipidemia LDL goal <70 01/15/2016   Obesity (BMI 30.0-34.9) 01/15/2016   Long term current use of anticoagulant 10/04/2015   Systemic lupus erythematosus (HCC) 05/30/2015   COPD, moderate (HCC) 06/27/2014   History of pulmonary embolism 12/11/2013   Nodule of right lung 12/11/2013   Cerebral venous sinus thrombosis  08/21/2013   Cystic disease of liver 08/21/2013   ONSET DATE: November 2024   REFERRING DIAG: imbalance  THERAPY DIAG:  Syncope due to orthostatic hypotension  Unsteadiness on feet  Cervicalgia  Other low back pain  Difficulty in walking, not elsewhere classified  Radiculopathy, lumbar region  Dizziness and giddiness  Radiculopathy, cervical region  Lymphedema, not elsewhere classified  Rationale for Evaluation and Treatment: Rehabilitation  SUBJECTIVE:                                                                                                                                                                                            SUBJECTIVE STATEMENT:   Pt reports no new  complaints and is ready for Halloween.  Pt notes that he typically has quite a bit of traffic.  Pt reports 5/10 pain in the neck/shoulders, upper back, lower back and running down the L LE.  Pt notes he is going to have a different injection on 11/17, that will hope to alleviate the neck pain.  PERTINENT HISTORY:   62yoM referred to OPPT for imbalance. Pt just finished OPPT for post surgical neck pain on 08/30/24 at [facility] so he could begin his balance therapy here. Pt has had issues with chronic orthostatic hypotension repeated syncope, fluctuating intolerance to upright. Pt has become quite good at managing his safety at home with symptoms fluctuations. PT checks BP daily, has PRN meds for excessively low or high pressures.   PAIN:  Are you having pain? 7/10  neck/shoulders; 7/10 low back   PRECAUTIONS: Fall  WEIGHT BEARING RESTRICTIONS: No  FALLS: Has patient fallen in last 6 months? Yes 1x in March 2025  LIVING ENVIRONMENT: Lives with: Alone  Lives in: 1 level  Stairs: threshold only Has following equipment at home: RW, canes, walking sticks.   PLOF: 2 years ago was able to go hiking for pleasure ab lib; used to get in and out kayak and do multi day treks overnight with kayak.   PATIENT GOALS: improve tolerance to upright mobility, stop falling as much   OBJECTIVE:  Note: Objective measures were completed at evaluation unless otherwise noted.   FUNCTIONAL TESTS:  TUG: 34sec (armless chair)  5xSTS: 54.9sec with BUE heavy use arm chair  : 14.63 Normal stance eyes closed: 9/22: 5-10sec for 3 bouts  Narrow stance eyes closed: 9/22: 17-18 sec for 3 bouts   PATIENT SURVEYS:  ABC Scale: 47.5%  TREATMENT DATE 10/20/24:    10/30:  Ambulation around the gym, down EVS hallway, past cafeteria and then returning to the gym, 500' total  MHP applied for  supine/hooklying exercises and monitored throughout the session:  Hooklying LTR, 2x10  Hooklying glute bridges, 2x10 Sidelying clamshells, 2x10 each LE Sidelying reverse clamshells, 2x10 each LE  STS from elevated plinth, 2x10 without UE assistance    10/27:   -overground AMB x67ft (increased today) upwalker:   -overground AMB x427ft (increased today) without AD.  *seated rest  *AMB to wellzone, no device  -leg press, seat 12, 3x12, plate 4 (increased previous session)  *AMB back to rehab area no AD   -standing marching x15 bil BUE support on plinth -standing heel raises/toe raise x 15, BUE support on plinth -side stepping at elevated mat for UE support 8ft x 5 bil  -partial squat x 12 with UE supported on elevated mat.    10/24:  Amb overground 2 x 417ft. With upright RW Sit<>stand throughout session with supervision assist and heavy UE support. X 10  Seated:  LAQ x 10 BTB HS curl x 12 BTB Clam shell x 10 BTB  Sit<>supine with supervision assist from PT Heat applied to cspine and low back in semireclined position Gentle STM to superior Cspine and suboccipital region x 8 min with slow gentle increased pressure; pt noted to have decreased sensitivity with increased time allowing slight pressure with STM, but reports mild increased nausea after 7-8 minutes.   After 1 min rest, reports decreased nausea.   Upon completion of STM pt reports slight improved cervical ROM. But no change in pain.     PATIENT EDUCATION: Education details: activity modification when neck is flared up  Person educated: [place]  Education method: Discussion, handout Education comprehension: Fair   HOME EXERCISE PROGRAM: Access Code: [email or web address] URL: [email or web address] Date: 09/12/2024 Prepared by: Massie Dollar  Exercises - sit to stand, arms crossed, 28-30 inch surface   - 3 x daily - 1 sets - 8 reps - Standing Near Stance in Corner with Eyes Closed  - 3 x daily - 1 sets -  5 reps - 30 hold - Semi-Tandem Balance at Counter Top Eyes Open  - 1 x daily - 7 x weekly - 3 sets - 4 reps - 15 hold - Standing March with Counter Support  - 1 x daily - 7 x weekly - 3 sets - 10 reps - Seated Long Arc Quad  - 1 x daily - 7 x weekly - 3 sets - 10 reps - Backward Walking with Counter Support  - 1 x daily - 7 x weekly - 3 sets - 5 reps  GOALS: Goals reviewed with patient? No  SHORT TERM GOALS: Target date: 10/07/24  Pt to report no falls in most recent 4 weeks to indicate good safety practices and symptoms management in the home.  Baseline: reports orthostatic s/s roughly 2x per week.  10/12/24: pt denies any falls in the last 4 weeks Goal status: MET  2.  Pt will demonstrate improvement in 5xSTS performance by >3 seconds.  Baseline: 54.9sec with BUE heavy use arm chair  10/12/24: 43.01 sec Goal status: INITIAL  3.  Pt to demonstrate improvement in by 20% and backwards by 25%  Baseline: 9/22: forward: 14.63 sec (0.76m/s). Backward: 49.04 sec 0.30m/s  10/12/24: 15.21 sec (0.66 m/s); 45.77 sec (0.22 m/s) Goal status: INITIAL   LONG TERM GOALS: Target date: 11/07/24  Pt to demonstrate tolerance  of >1047ft sustained AMB c LRAD without presyncope, without LOB, and without increase in back pain >2/10 points NPRS.  Baseline: tolerated 358ft with mild-moderate increase in L hip and low back pain;  09/26/24: 428ft with Upwalker, 50m53s (no increased pain in back/hip)  10/12/24: 793' with Upwalker, 6 min (+2/10 pain increase in the lumbar region) Goal status: PROGRESSING  2.  5xSTS performance, standard chair, hands free <20sec; hands free from 19.5 surface height in <12sec.   Baseline:  54.9sec with BUE heavy use arm chair  10/12/24: 43.01 sec Goal status: INITIAL  3.  Eye closed in narrow stance x60sec (5x without LOB and without need for hand support).  Baseline: 4-10 sec  10/12/24: 42 sec Goal status: PROGRESSING  ASSESSMENT:  CLINICAL  IMPRESSION:  Pt responded well to the exercises and noted to have a slight reduction in overall pain levels in the lumbar spine following therapy.  Pt still limited with ambulation attempts due to the pain he is experiencing in the lumbar spine.  Pt still benefits from endurance and strengthening training, however will need to continue to improve overall ROM of the spine without pain.   Pt will continue to benefit from skilled therapy to address remaining deficits in order to improve overall QoL and return to PLOF.       OBJECTIVE IMPAIRMENTS: Decreased knowledge of condition, decreased use of DME, decreased mobility, difficulty walking, decreased strength, decreased ROM. ACTIVITY LIMITATIONS: Lifting, standing, walking, squatting, transfers, locomotion level PARTICIPATION LIMITATIONS: Cleaning, laundry, interpersonal relationships, driving, yardwork, community activity.  PERSONAL FACTORS: Age, behavior pattern, education, past/current experiences, transportation, profession  are also affecting patient's functional outcome.  REHAB POTENTIAL: Good CLINICAL DECISION MAKING: Medium  EVALUATION COMPLEXITY: Moderate    PLAN:  PT FREQUENCY: 1-2x/week PT DURATION: 8 weeks PLANNED INTERVENTIONS: 97110-Therapeutic exercises, 97530- Therapeutic activity, 97112- Neuromuscular re-education, 97535- Self Care, [id number]- Manual therapy, [phone]- Gait training, [phone]- Electrical stimulation (unattended), [phone]- Electrical stimulation (manual), Patient/Family education, Balance training, Stair training, Cryotherapy, and Moist heat  PLAN FOR NEXT SESSION:   -continued leg press, overground balance training with walking sparingly due to easily provoked back issues -continue overground AMB of 4-5 laps with upwalker, start adding 2-3lb AW    Fonda Simpers, PT, DPT Physical Therapist - [facility]  [facility]  10/20/24, 11:59 AM

## 2024-10-21 ENCOUNTER — Encounter: Payer: Self-pay | Admitting: Orthopedic Surgery

## 2024-10-21 ENCOUNTER — Ambulatory Visit: Admitting: Physical Therapy

## 2024-10-24 ENCOUNTER — Ambulatory Visit

## 2024-10-25 ENCOUNTER — Ambulatory Visit: Admitting: Occupational Therapy

## 2024-10-25 ENCOUNTER — Encounter: Payer: Self-pay | Admitting: Occupational Therapy

## 2024-10-25 ENCOUNTER — Ambulatory Visit: Attending: Neurology

## 2024-10-25 DIAGNOSIS — R262 Difficulty in walking, not elsewhere classified: Secondary | ICD-10-CM | POA: Diagnosis not present

## 2024-10-25 DIAGNOSIS — M542 Cervicalgia: Secondary | ICD-10-CM | POA: Insufficient documentation

## 2024-10-25 DIAGNOSIS — R2681 Unsteadiness on feet: Secondary | ICD-10-CM | POA: Diagnosis not present

## 2024-10-25 DIAGNOSIS — I951 Orthostatic hypotension: Secondary | ICD-10-CM | POA: Insufficient documentation

## 2024-10-25 DIAGNOSIS — M5459 Other low back pain: Secondary | ICD-10-CM | POA: Insufficient documentation

## 2024-10-25 DIAGNOSIS — M5412 Radiculopathy, cervical region: Secondary | ICD-10-CM | POA: Insufficient documentation

## 2024-10-25 DIAGNOSIS — M5416 Radiculopathy, lumbar region: Secondary | ICD-10-CM | POA: Insufficient documentation

## 2024-10-25 DIAGNOSIS — I890 Lymphedema, not elsewhere classified: Secondary | ICD-10-CM | POA: Diagnosis not present

## 2024-10-25 DIAGNOSIS — R42 Dizziness and giddiness: Secondary | ICD-10-CM | POA: Insufficient documentation

## 2024-10-25 NOTE — Therapy (Signed)
 OUTPATIENT PHYSICAL THERAPY TREATMENT  Patient Name: Jonathon Snow MRN: [id number] DOB:[birth year], 63 y.o., male Today's Date: 10/25/2024  PCP: [name], PA-C REFERRING PROVIDER: [name], PA-C  END OF SESSION:  PT End of Session - 10/25/24 1207     Visit Number 14    Number of Visits 16    Date for Recertification  11/02/24    Authorization Type [place] [employer]    Authorization Time Period 09/07/24-11/02/24    Progress Note Due on Visit 20    PT Start Time 1206    PT Stop Time 1245    PT Time Calculation (min) 39 min    Equipment Utilized During Treatment Gait belt    Activity Tolerance Patient tolerated treatment well;No increased pain;Patient limited by pain    Behavior During Therapy [facility] for tasks assessed/performed            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   Overview:  superior sagittal sinus, left transverse sinus and cortical veins    Closed left hip fracture, initial encounter (HCC) 02/18/2023   COPD (chronic obstructive pulmonary disease) (HCC)    COVID-19 virus infection 12/2020   Depression    DVT (deep venous thrombosis) (HCC)    Dyspnea    GERD (gastroesophageal reflux disease)    Head injury 11/05/2023   Heart attack (HCC) 11/05/2023   Heel spur, left 02/16/2019   Heel spur, right 02/16/2019   Hematoma of groin 11/08/2023   Herpes zoster infection of lumbar region 02/20/2020   History of DVT (deep vein thrombosis) 03/22/2020   History of kidney stones    HTN (hypertension)    Hyperlipidemia    Hypertension    Long term current use of systemic steroids 11/08/2020   Lupus    Lymphedema 10/07/2018   Morbid obesity (HCC)    Opiate abuse, episodic (HCC) 02/26/2018   Osteoporosis    Pain  and swelling of right lower extremity 02/03/2023   Pneumonia    PONV (postoperative nausea and vomiting)    Postphlebitic syndrome with ulcer, 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   Presence of IVC filter 03/22/2020   Removed   Pulmonary embolism (HCC)    Renal disorder    Stage III   Severe episode of recurrent major depressive disorder, 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   Shock circulatory (HCC) 11/06/2023   Sleep apnea    STEMI (ST elevation myocardial infarction) (HCC) 11/05/2023   Past Surgical History:  Procedure Laterality Date   ANKLE SURGERY Right    BACK SURGERY     BRONCHIAL WASHINGS N/A 11/01/2021   Procedure: BRONCHIAL WASHINGS;  Surgeon: Parris Manna, MD;  Location: [facility] ORS;  Service: Thoracic;  Laterality: N/A;   COLONOSCOPY WITH PROPOFOL  N/A 05/28/2020   Procedure: COLONOSCOPY WITH PROPOFOL ;  Surgeon: Therisa Bi, MD;  Location: [facility] ENDOSCOPY;  Service: Endoscopy;  Laterality: N/A;   COLONOSCOPY WITH PROPOFOL  N/A 07/01/2023   Procedure: COLONOSCOPY WITH PROPOFOL ;  Surgeon: Therisa Bi, MD;  Location: [facility] ENDOSCOPY;  Service: Gastroenterology;  Laterality: N/A;   CORONARY ULTRASOUND/IVUS N/A 11/05/2023   Procedure: Coronary Ultrasound/IVUS;  Surgeon: Mady Bruckner, MD;  Location: [facility] INVASIVE CV LAB;  Service: Cardiovascular;  Laterality: N/A;   CORONARY/GRAFT ACUTE MI REVASCULARIZATION N/A 11/05/2023  Procedure: Coronary/Graft Acute MI Revascularization;  Surgeon: Mady Bruckner, MD;  Location: [facility] INVASIVE CV LAB;  Service: Cardiovascular;  Laterality: N/A;   CYST EXCISION  92 or 93    Liver cyst removal [facility]   FLEXIBLE BRONCHOSCOPY N/A 11/01/2021   Procedure: FLEXIBLE BRONCHOSCOPY;  Surgeon: Parris Manna, MD;  Location: [facility] ORS;  Service: Thoracic;  Laterality: N/A;   FRACTURE SURGERY     HIP PINNING,CANNULATED Left 02/19/2023   Procedure: PERCUTANEOUS FIXATION OF FEMORAL NECK;  Surgeon: Tobie Priest, MD;  Location: [facility] ORS;  Service: Orthopedics;   Laterality: Left;   I & D EXTREMITY Right 04/29/2017   Procedure: IRRIGATION AND DEBRIDEMENT EXTREMITY;  Surgeon: Shelva Dunnings, MD;  Location: [facility] ORS;  Service: General;  Laterality: Right;   IRRIGATION AND DEBRIDEMENT ABSCESS Left 04/29/2017   Procedure: IRRIGATION AND DEBRIDEMENT Scrotal ABSCESS;  Surgeon: Shelva Dunnings, MD;  Location: [facility] ORS;  Service: General;  Laterality: Left;   LEFT HEART CATH AND CORONARY ANGIOGRAPHY N/A 11/05/2023   Procedure: LEFT HEART CATH AND CORONARY ANGIOGRAPHY;  Surgeon: Mady Bruckner, MD;  Location: [facility] INVASIVE CV LAB;  Service: Cardiovascular;  Laterality: N/A;   POLYPECTOMY  07/01/2023   Procedure: POLYPECTOMY;  Surgeon: Therisa Bi, MD;  Location: [facility] ENDOSCOPY;  Service: Gastroenterology;;   SPINE SURGERY     Patient Active Problem List   Diagnosis Date Noted   B12 deficiency 07/28/2024   Vitamin D  deficiency 07/28/2024   Cervical radicular pain 05/26/2024   History of thoracic spinal fusion (T7-T12) 05/26/2024   History of lumbar spinal fusion (L5-S1) 05/26/2024   Chronic pain syndrome 05/26/2024   Anxiety and depression 02/26/2024   Dyslipidemia 02/26/2024   Peripheral neuropathy 02/26/2024   Essential hypertension 02/26/2024   Vertigo 02/26/2024   Orthostatic hypotension 02/11/2024   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) 01/20/2024   Anxiety 12/14/2023   Overweight (BMI 25.0-29.9) 11/23/2023   Chronic heart failure with preserved ejection fraction (HFpEF) (HCC) 11/23/2023   BPH (benign prostatic hyperplasia) 11/23/2023   Unstable angina (HCC) 11/10/2023   Closed T10 spinal fracture (HCC) 11/06/2023   Closed tricolumnar fracture of thoracic vertebra (HCC) 11/06/2023   Thoracic spine instability 11/06/2023   Chronic bilateral low back pain with left-sided sciatica 07/07/2023   Neuropathy 07/07/2023   Hx of colonic polyps 07/01/2023   Adenomatous polyp of colon 07/01/2023   Erectile disorder  01/22/2023   Coronary artery disease involving native coronary artery of native heart without angina pectoris 09/09/2022   Varicose 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 01/03/2022   Immunocompromised state due to drug therapy 09/18/2021   Prediabetes 11/08/2020   Stage 3a chronic kidney disease (HCC) 11/08/2020   Seasonal allergies 11/08/2020   Rhinosinusitis 11/08/2020   Anticoagulant disorder 11/07/2020   Senile purpura 11/07/2020   MDD (major depressive disorder), recurrent episode, mild 09/12/2019   Other spondylosis with radiculopathy, lumbar region 02/16/2019   Osteoporosis 10/12/2018   Chronic venous insufficiency 10/07/2018   Lymphedema 10/07/2018   SLE glomerulonephritis syndrome, 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HCC) 03/03/2018   Syncope and collapse 12/29/2017   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) 10/29/2016   Hyperlipidemia LDL goal <70 01/15/2016   Obesity (BMI 30.0-34.9) 01/15/2016   Long term current use of anticoagulant 10/04/2015   Systemic lupus erythematosus (HCC) 05/30/2015   COPD, moderate (HCC) 06/27/2014   History of pulmonary embolism 12/11/2013   Nodule of right lung 12/11/2013   Cerebral venous sinus thrombosis  08/21/2013   Cystic disease of liver 08/21/2013   ONSET DATE: November 2024   REFERRING DIAG: imbalance  THERAPY DIAG:  Syncope due to orthostatic hypotension  Unsteadiness on feet  Cervicalgia  Other low back pain  Difficulty in walking, not elsewhere classified  Radiculopathy, lumbar region  Dizziness and giddiness  Radiculopathy, cervical region  Rationale for Evaluation and Treatment: Rehabilitation  SUBJECTIVE:                                                                                                                                                                                            SUBJECTIVE STATEMENT:   Pt denies any complications since the last time being  seen.  Pt just finished his appointment for lymph and trying to get measured for his L leg sleeve.  Of Note: Pt he is going to have a different injection on 11/17, that will hope to alleviate the neck pain.  PERTINENT HISTORY:   62yoM referred to OPPT for imbalance. Pt just finished OPPT for post surgical neck pain on 08/30/24 at [facility] so he could begin his balance therapy here. Pt has had issues with chronic orthostatic hypotension repeated syncope, fluctuating intolerance to upright. Pt has become quite good at managing his safety at home with symptoms fluctuations. PT checks BP daily, has PRN meds for excessively low or high pressures.   PAIN:  Are you having pain? 7/10  neck/shoulders; 7/10 low back   PRECAUTIONS: Fall  WEIGHT BEARING RESTRICTIONS: No  FALLS: Has patient fallen in last 6 months? Yes 1x in March 2025  LIVING ENVIRONMENT: Lives with: Alone  Lives in: 1 level  Stairs: threshold only Has following equipment at home: RW, canes, walking sticks.   PLOF: 2 years ago was able to go hiking for pleasure ab lib; used to get in and out kayak and do multi day treks overnight with kayak.   PATIENT GOALS: improve tolerance to upright mobility, stop falling as much   OBJECTIVE:  Note: Objective measures were completed at evaluation unless otherwise noted.   FUNCTIONAL TESTS:  TUG: 34sec (armless chair)  5xSTS: 54.9sec with BUE heavy use arm chair  : 14.63 Normal stance eyes closed: 9/22: 5-10sec for 3 bouts  Narrow stance eyes closed: 9/22: 17-18 sec for 3 bouts   PATIENT SURVEYS:  ABC Scale: 47.5%  TREATMENT DATE 10/25/24:   TherAct:  MHP applied to the lumbar spine for all seated exercises:  The patient completed 10 minutes at level(s) 4-8 on the NuStep using both BUE/BLE reciprocal movements to promote strength, endurance, and  cardiorespiratory fitness. PT increased the resistance level and monitored the patient's response to the intervention throughout. The patient required min cueing for technique and supervision level of assistance.   Seated pallof press, 7.5#, x10 each direction  Seated hamstring curls with BlueTB, 2x10 each LE Seated hip abduction with BlueTB, 2x10 each LE Seated hip adduction squeezes with rainbow physioball, 2x10, 3 sec holds   10/30:  Ambulation around the gym, down EVS hallway, past cafeteria and then returning to the gym, 500' total  MHP applied for supine/hooklying exercises and monitored throughout the session:  Hooklying LTR, 2x10  Hooklying glute bridges, 2x10 Sidelying clamshells, 2x10 each LE Sidelying reverse clamshells, 2x10 each LE  STS from elevated plinth, 2x10 without UE assistance    10/27:   -overground AMB x650ft (increased today) upwalker:   -overground AMB x454ft (increased today) without AD.  *seated rest  *AMB to wellzone, no device  -leg press, seat 12, 3x12, plate 4 (increased previous session)  *AMB back to rehab area no AD   -standing marching x15 bil BUE support on plinth -standing heel raises/toe raise x 15, BUE support on plinth -side stepping at elevated mat for UE support 75ft x 5 bil  -partial squat x 12 with UE supported on elevated mat.    10/24:  Amb overground 2 x 430ft. With upright RW Sit<>stand throughout session with supervision assist and heavy UE support. X 10  Seated:  LAQ x 10 BTB HS curl x 12 BTB Clam shell x 10 BTB  Sit<>supine with supervision assist from PT Heat applied to cspine and low back in semireclined position Gentle STM to superior Cspine and suboccipital region x 8 min with slow gentle increased pressure; pt noted to have decreased sensitivity with increased time allowing slight pressure with STM, but reports mild increased nausea after 7-8 minutes.   After 1 min rest, reports decreased nausea.   Upon  completion of STM pt reports slight improved cervical ROM. But no change in pain.     PATIENT EDUCATION: Education details: activity modification when neck is flared up  Person educated: [place]  Education method: Discussion, handout Education comprehension: Fair   HOME EXERCISE PROGRAM: Access Code: [email or web address] URL: [email or web address] Date: 09/12/2024 Prepared by: Massie Dollar  Exercises - sit to stand, arms crossed, 28-30 inch surface   - 3 x daily - 1 sets - 8 reps - Standing Near Stance in Corner with Eyes Closed  - 3 x daily - 1 sets - 5 reps - 30 hold - Semi-Tandem Balance at Counter Top Eyes Open  - 1 x daily - 7 x weekly - 3 sets - 4 reps - 15 hold - Standing March with Counter Support  - 1 x daily - 7 x weekly - 3 sets - 10 reps - Seated Long Arc Quad  - 1 x daily - 7 x weekly - 3 sets - 10 reps - Backward Walking with Counter Support  - 1 x daily - 7 x weekly - 3 sets - 5 reps  GOALS: Goals reviewed with patient? No  SHORT TERM GOALS: Target date: 10/07/24  Pt to report no falls in most recent 4 weeks to indicate good safety practices and symptoms management in the home.  Baseline: reports orthostatic s/s  roughly 2x per week.  10/12/24: pt denies any falls in the last 4 weeks Goal status: MET  2.  Pt will demonstrate improvement in 5xSTS performance by >3 seconds.  Baseline: 54.9sec with BUE heavy use arm chair  10/12/24: 43.01 sec Goal status: INITIAL  3.  Pt to demonstrate improvement in by 20% and backwards by 25%  Baseline: 9/22: forward: 14.63 sec (0.48m/s). Backward: 49.04 sec 0.4m/s  10/12/24: 15.21 sec (0.66 m/s); 45.77 sec (0.22 m/s) Goal status: INITIAL   LONG TERM GOALS: Target date: 11/07/24  Pt to demonstrate tolerance of >1027ft sustained AMB c LRAD without presyncope, without LOB, and without increase in back pain >2/10 points NPRS.  Baseline: tolerated 330ft with mild-moderate increase in L hip and low back pain;   09/26/24: 424ft with Upwalker, 55m53s (no increased pain in back/hip)  10/12/24: 793' with Upwalker, 6 min (+2/10 pain increase in the lumbar region) Goal status: PROGRESSING  2.  5xSTS performance, standard chair, hands free <20sec; hands free from 19.5 surface height in <12sec.   Baseline:  54.9sec with BUE heavy use arm chair  10/12/24: 43.01 sec Goal status: INITIAL  3.  Eye closed in narrow stance x60sec (5x without LOB and without need for hand support).  Baseline: 4-10 sec  10/12/24: 42 sec Goal status: PROGRESSING  ASSESSMENT:  CLINICAL IMPRESSION:  Pt responded fair to the exercises, however is still having significant pain that limits his ability to perform exercises.  Pt was able to sit for the exercises and MHP applied to the low back to assist in pain modulation.  Pt is getting an injection in 2 weeks, and hopefully this will assist with the neck and shoulder pain that he is having fairly consistently.   Pt will continue to benefit from skilled therapy to address remaining deficits in order to improve overall QoL and return to PLOF.        OBJECTIVE IMPAIRMENTS: Decreased knowledge of condition, decreased use of DME, decreased mobility, difficulty walking, decreased strength, decreased ROM. ACTIVITY LIMITATIONS: Lifting, standing, walking, squatting, transfers, locomotion level PARTICIPATION LIMITATIONS: Cleaning, laundry, interpersonal relationships, driving, yardwork, community activity.  PERSONAL FACTORS: Age, behavior pattern, education, past/current experiences, transportation, profession  are also affecting patient's functional outcome.  REHAB POTENTIAL: Good CLINICAL DECISION MAKING: Medium  EVALUATION COMPLEXITY: Moderate    PLAN:  PT FREQUENCY: 1-2x/week PT DURATION: 8 weeks PLANNED INTERVENTIONS: 97110-Therapeutic exercises, 97530- Therapeutic activity, 97112- Neuromuscular re-education, 97535- Self Care, [id number]- Manual therapy, [phone]- Gait training,  [phone]- Electrical stimulation (unattended), [phone]- Electrical stimulation (manual), Patient/Family education, Balance training, Stair training, Cryotherapy, and Moist heat  PLAN FOR NEXT SESSION:   -continued leg press, overground balance training with walking sparingly due to easily provoked back issues -continue overground AMB of 4-5 laps with upwalker, start adding 2-3lb AW    Fonda Simpers, PT, DPT Physical Therapist - [facility]  [facility]  10/25/24, 1:45 PM

## 2024-10-25 NOTE — Therapy (Unsigned)
 OUTPATIENT OCCUPATIONAL THERAPY EVALUATION  LEFT LOWER EXTREMITY/LEFT LOWER QUADRANT LYMPHEDEMA   Patient Name: Jonathon Snow MRN: [id number] DOB:[birth year], 63 y.o., male Today's Date: 10/25/2024  END OF SESSION:  OT End of Session - 10/25/24 1118     Visit Number 1    Number of Visits 6    Date for Recertification  01/23/25    OT Start Time 1115    OT Stop Time 1215    OT Time Calculation (min) 60 min    Activity Tolerance Patient tolerated treatment well;No increased pain    Behavior During Therapy [facility] for tasks assessed/performed            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   Overview:  superior sagittal sinus, left transverse sinus and cortical veins    Closed left hip fracture, initial encounter (HCC) 02/18/2023   COPD (chronic obstructive pulmonary disease) (HCC)    COVID-19 virus infection 12/2020   Depression    DVT (deep venous thrombosis) (HCC)    Dyspnea    GERD (gastroesophageal reflux disease)    Head injury 11/05/2023   Heart attack (HCC) 11/05/2023   Heel spur, left 02/16/2019   Heel spur, right 02/16/2019   Hematoma of groin 11/08/2023   Herpes zoster infection of lumbar region 02/20/2020   History of DVT (deep vein thrombosis) 03/22/2020   History of kidney stones    HTN (hypertension)    Hyperlipidemia    Hypertension    Long term current use of systemic steroids 11/08/2020   Lupus    Lymphedema 10/07/2018   Morbid obesity (HCC)    Opiate abuse, episodic (HCC) 02/26/2018   Osteoporosis    Pain and swelling of right lower extremity 02/03/2023   Pneumonia    PONV (postoperative nausea and vomiting)    Postphlebitic syndrome with ulcer, 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   Presence of IVC filter  03/22/2020   Removed   Pulmonary embolism (HCC)    Renal disorder    Stage III   Severe episode of recurrent major depressive disorder, 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   Shock circulatory (HCC) 11/06/2023   Sleep apnea    STEMI (ST elevation myocardial infarction) (HCC) 11/05/2023   Past Surgical History:  Procedure Laterality Date   ANKLE SURGERY Right    BACK SURGERY     BRONCHIAL WASHINGS N/A 11/01/2021   Procedure: BRONCHIAL WASHINGS;  Surgeon: Parris Manna, MD;  Location: [facility] ORS;  Service: Thoracic;  Laterality: N/A;   COLONOSCOPY WITH PROPOFOL  N/A 05/28/2020   Procedure: COLONOSCOPY WITH PROPOFOL ;  Surgeon: Therisa Bi, MD;  Location: [facility] ENDOSCOPY;  Service: Endoscopy;  Laterality: N/A;   COLONOSCOPY WITH PROPOFOL  N/A 07/01/2023   Procedure: COLONOSCOPY WITH PROPOFOL ;  Surgeon: Therisa Bi, MD;  Location: [name] ENDOSCOPY;  Service: Gastroenterology;  Laterality: N/A;   CORONARY ULTRASOUND/IVUS N/A 11/05/2023   Procedure: Coronary Ultrasound/IVUS;  Surgeon: Mady Bruckner, MD;  Location: [facility] INVASIVE CV LAB;  Service: Cardiovascular;  Laterality: N/A;   CORONARY/GRAFT ACUTE MI REVASCULARIZATION N/A 11/05/2023   Procedure: Coronary/Graft Acute MI Revascularization;  Surgeon: Mady Bruckner, MD;  Location: [facility] INVASIVE CV LAB;  Service: Cardiovascular;  Laterality: N/A;   CYST EXCISION  92 or 93    Liver cyst removal [facility]  FLEXIBLE BRONCHOSCOPY N/A 11/01/2021   Procedure: FLEXIBLE BRONCHOSCOPY;  Surgeon: Parris Manna, MD;  Location: [facility] ORS;  Service: Thoracic;  Laterality: N/A;   FRACTURE SURGERY     HIP PINNING,CANNULATED Left 02/19/2023   Procedure: PERCUTANEOUS FIXATION OF FEMORAL NECK;  Surgeon: Tobie Priest, MD;  Location: [facility] ORS;  Service: Orthopedics;  Laterality: Left;   I & D EXTREMITY Right 04/29/2017   Procedure: IRRIGATION AND DEBRIDEMENT EXTREMITY;  Surgeon: Shelva Dunnings, MD;  Location: [facility] ORS;  Service: General;  Laterality: Right;    IRRIGATION AND DEBRIDEMENT ABSCESS Left 04/29/2017   Procedure: IRRIGATION AND DEBRIDEMENT Scrotal ABSCESS;  Surgeon: Shelva Dunnings, MD;  Location: [facility] ORS;  Service: General;  Laterality: Left;   LEFT HEART CATH AND CORONARY ANGIOGRAPHY N/A 11/05/2023   Procedure: LEFT HEART CATH AND CORONARY ANGIOGRAPHY;  Surgeon: Mady Bruckner, MD;  Location: [facility] INVASIVE CV LAB;  Service: Cardiovascular;  Laterality: N/A;   POLYPECTOMY  07/01/2023   Procedure: POLYPECTOMY;  Surgeon: Therisa Bi, MD;  Location[facility] ENDOSCOPY;  Service: Gastroenterology;;   SPINE SURGERY     Patient Active Problem List   Diagnosis Date Noted   B12 deficiency 07/28/2024   Vitamin D  deficiency 07/28/2024   Cervical radicular pain 05/26/2024   History of thoracic spinal fusion (T7-T12) 05/26/2024   History of lumbar spinal fusion (L5-S1) 05/26/2024   Chronic pain syndrome 05/26/2024   Anxiety and depression 02/26/2024   Dyslipidemia 02/26/2024   Peripheral neuropathy 02/26/2024   Essential hypertension 02/26/2024   Vertigo 02/26/2024   Orthostatic hypotension 02/11/2024   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) 01/20/2024   Anxiety 12/14/2023   Overweight (BMI 25.0-29.9) 11/23/2023   Chronic heart failure with preserved ejection fraction (HFpEF) (HCC) 11/23/2023   BPH (benign prostatic hyperplasia) 11/23/2023   Unstable angina (HCC) 11/10/2023   Closed T10 spinal fracture (HCC) 11/06/2023   Closed tricolumnar fracture of thoracic vertebra (HCC) 11/06/2023   Thoracic spine instability 11/06/2023   Chronic bilateral low back pain with left-sided sciatica 07/07/2023   Neuropathy 07/07/2023   Hx of colonic polyps 07/01/2023   Adenomatous polyp of colon 07/01/2023   Erectile disorder 01/22/2023   Coronary artery disease involving native coronary artery of native heart without angina pectoris 09/09/2022   Varicose 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 01/03/2022    Immunocompromised state due to drug therapy 09/18/2021   Prediabetes 11/08/2020   Stage 3a chronic kidney disease (HCC) 11/08/2020   Seasonal allergies 11/08/2020   Rhinosinusitis 11/08/2020   Anticoagulant disorder 11/07/2020   Senile purpura 11/07/2020   MDD (major depressive disorder), recurrent episode, mild 09/12/2019   Other spondylosis with radiculopathy, lumbar region 02/16/2019   Osteoporosis 10/12/2018   Chronic venous insufficiency 10/07/2018   Lymphedema 10/07/2018   SLE glomerulonephritis syndrome, 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HCC) 03/03/2018   Syncope and collapse 12/29/2017   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) 10/29/2016   Hyperlipidemia LDL goal <70 01/15/2016   Obesity (BMI 30.0-34.9) 01/15/2016   Long term current use of anticoagulant 10/04/2015   Systemic lupus erythematosus (HCC) 05/30/2015   COPD, moderate (HCC) 06/27/2014   History of pulmonary embolism 12/11/2013   Nodule of right lung 12/11/2013   Cerebral venous sinus thrombosis 08/21/2013   Cystic disease of liver 08/21/2013    PCP: Michelene Cower, PA-C  REFERRING PROVIDER: Selinda Gu , MD  REFERRING DIAG: I89.0  THERAPY DIAG:  Lymphedema, not elsewhere classified  Rationale for Evaluation and  Treatment: Rehabilitation  ONSET DATE: 2013 s/p DVT  SUBJECTIVE:  Jonathon Snow is referred to Occupational Therapy by Selinda Gu, MD, for evaluation and treatment of LLE/LLQ secondary lymphedema. This Pt is well known to this OT as he completed Intensive and Self-Management Phases of Complete Decongestive Therapy between 07/07/23 and 10/08/23 for a total of 17 visits. Pt achieved a modest limb volume reduction and was fitted with a custom, [employer],  flat knit ccl 2 ( 23-32 mmHg), thigh length compression garment, which is worn out and in need of replacement. Pt reports he wears garment daily and it does a good job of controlling his leg swelling. Pt states, It's  gotten looser too and it's not as hard to put on. Pt endorses ~ 20 lb weight loss since obtaining this garment, which will also affect fit. Since last seen Jonathon Snow had a fall after fainting in his kitchen. He was told when he awoke in the hospital that he had a heart attack and a head injury. He tell me he is working on improving his balance and strength in PT. His goal for OT is to replace worn out compression=pession garment with like garments, including also an hours of sleep device and biker shorts. He will bring these to next appointment   for comparison when we complete measurements.                                                                                                                                                                             SUBJECTIVE STATEMENT: Jonathon Snow is referred to Occupational Therapy by Selinda Gu, MD, for evaluation and treatment of LLE/LLQ secondary lymphedema. This Pt is well known to this OT as he completed Intensive and Self-Management Phases of Complete Decongestive Therapy between 07/07/23 and 10/08/23 for a total of 17 visits. Pt achieved a modest limb volume reduction and was fitted with a custom, [employer],  flat knit ccl 2 ( 23-32 mmHg), thigh length compression garment, which is worn out and in need of replacement. Pt reports he wears garment daily and it does a good job of controlling his leg swelling. Pt states, It's gotten looser too and it's not as hard to put on. Pt endorses ~ 20 lb weight loss since obtaining this garment, which will also affect fit. Since last seen Jonathon Snow had a fall after fainting in his kitchen. He was told when he awoke in the hospital that he had a heart attack and a head injury. He tell me he is working on improving his balance and strength in PT. His goal for OT is to replace worn out compression=pession garment with like garments, including also an hours of sleep device  and biker shorts. He will bring these to next  appointment   for comparison when we complete measurements.                            PERTINENT HISTORY:   PAIN:  Are you having pain? Yes: NPRS scale: 6/10 Pain location: upper back, neck, shoulders,lower back L leg and hip Pain description: constant , aching, stabbing Aggravating factors: movement, weight bearing Relieving factors: laying down, pain meds  PRECAUTIONS: {Therapy precautions:24002}  RED FLAGS: {PT Red Flags:29287}   WEIGHT BEARING RESTRICTIONS: {Yes ***/No:24003}  FALLS:  Has patient fallen in last 6 months? {fallsyesno:27318}  LIVING ENVIRONMENT: Lives with: {[facility] lives with:25569::lives with their family} Lives in: House/apartment Stairs: {yes/no:20286}; {Stairs:24000} Has following equipment at home: {Assistive devices:23999}  OCCUPATION: ***  LEISURE: ***  HAND DOMINANCE: right   PRIOR LEVEL OF FUNCTION: Independent  PATIENT GOALS: ***   OBJECTIVE: Note: Objective measures were completed at Evaluation unless otherwise noted.  COGNITION:  Overall cognitive status: {cognition:24006}   PALPATION: ***  OBSERVATIONS / OTHER ASSESSMENTS: ***  SENSATION: {sensation:27233}  POSTURE: ***  LE ROM:   {AROM/PROM:27142}  Right 10/25/2024 LEFT 10/25/2024  Hip flexion    Hip extension    Hip abduction    Hip adduction    Hip internal rotation    Hip external rotation    Knee flexion    Knee extension    Ankle dorsiflexion    Ankle plantarflexion    Ankle inversion    Ankle eversion     (Blank rows = not tested)  LE MMT:   MMT Right 10/25/2024 Left 10/25/2024  Hip flexion    Hip extension    Hip abduction    Hip adduction    Hip internal rotation    Hip external rotation    Knee flexion    Knee extension    Ankle dorsiflexion    Ankle plantarflexion    Ankle inversion    Ankle eversion     (Blank rows = not tested)  LYMPHEDEMA ASSESSMENTS:   SURGERY TYPE/DATE: ***  NUMBER OF LYMPH NODES REMOVED: ***  CHEMOTHERAPY:  ***  RADIATION:***  HORMONE TREATMENT: ***  INFECTIONS: ***   LYMPHEDEMA ASSESSMENTS:   LANDMARK RIGHT  10/25/2024  10 cm proximal to olecranon process   Olecranon process   10 cm proximal to ulnar styloid process   Just proximal to ulnar styloid process   Across hand at thumb web space   At base of 2nd digit   (Blank rows = not tested)  LANDMARK LEFT  10/25/2024  10 cm proximal to olecranon process   Olecranon process   10 cm proximal to ulnar styloid process   Just proximal to ulnar styloid process   Across hand at thumb web space   At base of 2nd digit   (Blank rows = not tested)   LE LANDMARK RIGHT 10/25/2024  At groin   30 cm proximal to suprapatella   20 cm proximal to suprapatella   10 cm proximal to suprapatella   At midpatella / popliteal crease   30 cm proximal to floor at lateral plantar foot   20 cm proximal to floor at lateral plantar foot   10 cm proximal to floor at lateral plantar foot   Circumference of ankle/heel   5 cm proximal to 1st MTP joint   Across MTP joint   Around proximal great toe   (Blank rows = not  tested)  LE LANDMARK LEFT 10/25/2024  At groin   30 cm proximal to suprapatella   20 cm proximal to suprapatella   10 cm proximal to suprapatella   At midpatella / popliteal crease   30 cm proximal to floor at lateral plantar foot   20 cm proximal to floor at lateral plantar foot   10 cm proximal to floor at lateral plantar foot   Circumference of ankle/heel   5 cm proximal to 1st MTP joint   Across MTP joint   Around proximal great toe   (Blank rows = not tested)  FUNCTIONAL TESTS:  {Functional tests:24029}  GAIT: Distance walked: *** Assistive device utilized: {Assistive devices:23999} Level of assistance: {Levels of assistance:24026} Comments: ***  LYMPHEDEMA LIFE IMPACT SCALE (LLIS):  I. Physical Concerns The amount of pain associated with my lymphedema is: {LLISSELECTION:27229} The amount of limb heaviness associated  with my lymphedema is: {LLISSELECTION:27229} The amount of skin tightness associated with my lymphedema is: {LLISSELECTION:27229} In comparison to my unaffected limb, the size of my swollen limb seems: {LLISSELECTION:27229} In comparison to my unaffected limb, the texture of my swollen limb feels: {LLISSELECTION:27229} Lymphedema affects movement of my swollen limb: {LLISSELECTION:27229} The strength in my swollen limb compared with the unaffected limb is: {LLISSELECTION:27229} How often have you become ill wit an infection in your swollen limb requiring oral antibiotics or hospitalization in the past 2 YEARS? {LLISSELECTION:27229}  II. Psychosocial Concerns 9.   Lymphedema affects my body image (ie. How I think I look): {LLISSELECTION:27229} 10. Lymphedema affects my socializing with others: {LLISSELECTION:27229} 11. Lymphedema affects my intimate relations: {LLISSELECTION:27229} 12. Lymphedema gets me down (ie. I have feelings of depression, frustration, or anger due to the lymphedema): {LLISSELECTION:27229}  III. Functional Concerns 13. Lymphedema affects my ability to perform duties at home: {LLISSELECTION:27229} 14. Lymphedema affects my ability to perform dueites at work (if applicable): {LLISSELECTION:27229} 15. Lymphedema affects my performance of preferred recreational activities: {LLISSELECTION:27229} 16. Lymphedema affects the proper fit of clothing/shoes: {LLISSELECTION:27229} 17. Lymphedema affects my sleep: {LLISSELECTION:27229} 18. I must rely on others for help due to my lymphedema: {LLISSELECTION:27229}                                                                                                                            TREATMENT DATE: ***   PATIENT EDUCATION:  Education details: *** Person educated: {Person educated:25204} Education method: {Education Method:25205} Education comprehension: {Education Comprehension:25206}  HOME EXERCISE  PROGRAM: ***  ASSESSMENT:  CLINICAL IMPRESSION: Patient is a *** y.o. *** who was seen today for physical therapy evaluation and treatment for ***.   OBJECTIVE IMPAIRMENTS: {opptimpairments:25111}.   ACTIVITY LIMITATIONS: {activitylimitations:27494}  PARTICIPATION LIMITATIONS: {participationrestrictions:25113}  PERSONAL FACTORS: {Personal factors:25162} are also affecting patient's functional outcome.   REHAB POTENTIAL: {rehabpotential:25112}  CLINICAL DECISION MAKING: {clinical decision making:25114}  EVALUATION COMPLEXITY: {Evaluation complexity:25115}   GOALS: Goals reviewed with patient? {yes/no:20286}  SHORT TERM GOALS: Target date: ***  *** Baseline: Goal status: INITIAL  2.  *** Baseline:  Goal status: INITIAL  3.  *** Baseline:  Goal status: INITIAL  4.  *** Baseline:  Goal status: INITIAL  5.  *** Baseline:  Goal status: INITIAL  6.  *** Baseline:  Goal status: INITIAL  LONG TERM GOALS: Target date: ***  *** Baseline:  Goal status: INITIAL  2.  *** Baseline:  Goal status: INITIAL  3.  *** Baseline:  Goal status: INITIAL  4.  *** Baseline:  Goal status: INITIAL  5.  *** Baseline:  Goal status: INITIAL  6.  *** Baseline:  Goal status: INITIAL   PLAN:  PT FREQUENCY: {rehab frequency:25116}  PT DURATION: {rehab duration:25117}  PLANNED INTERVENTIONS: {rehab planned interventions:25118::97110-Therapeutic exercises,97530- Therapeutic [phone]- Neuromuscular re-education,97535- Self Rjmz,02859- Manual therapy,Patient/Family education}  PLAN FOR NEXT SESSION: ***  Jonathon Dec, MS, OTR/L, CLT-LANA 10/25/24 11:44 AM  10/25/2024, 11:44 AM

## 2024-10-26 ENCOUNTER — Inpatient Hospital Stay: Admission: RE | Admit: 2024-10-26 | Source: Ambulatory Visit

## 2024-10-26 ENCOUNTER — Ambulatory Visit

## 2024-10-26 ENCOUNTER — Encounter (HOSPITAL_COMMUNITY): Payer: Self-pay

## 2024-10-27 ENCOUNTER — Ambulatory Visit: Admission: RE | Admit: 2024-10-27

## 2024-10-28 ENCOUNTER — Ambulatory Visit: Admitting: Family Medicine

## 2024-10-31 ENCOUNTER — Ambulatory Visit
Admission: RE | Admit: 2024-10-31 | Discharge: 2024-10-31 | Disposition: A | Discharge status: Home / Self Care | Source: Ambulatory Visit | Attending: Orthopedic Surgery | Admitting: Orthopedic Surgery

## 2024-10-31 ENCOUNTER — Ambulatory Visit

## 2024-10-31 DIAGNOSIS — M47812 Spondylosis without myelopathy or radiculopathy, cervical region: Secondary | ICD-10-CM | POA: Diagnosis not present

## 2024-10-31 DIAGNOSIS — M5412 Radiculopathy, cervical region: Secondary | ICD-10-CM | POA: Diagnosis not present

## 2024-10-31 DIAGNOSIS — I951 Orthostatic hypotension: Secondary | ICD-10-CM | POA: Diagnosis not present

## 2024-10-31 DIAGNOSIS — R262 Difficulty in walking, not elsewhere classified: Secondary | ICD-10-CM | POA: Diagnosis not present

## 2024-10-31 DIAGNOSIS — I890 Lymphedema, not elsewhere classified: Secondary | ICD-10-CM | POA: Diagnosis not present

## 2024-10-31 DIAGNOSIS — M5459 Other low back pain: Secondary | ICD-10-CM | POA: Diagnosis not present

## 2024-10-31 DIAGNOSIS — R42 Dizziness and giddiness: Secondary | ICD-10-CM | POA: Diagnosis not present

## 2024-10-31 DIAGNOSIS — M5416 Radiculopathy, lumbar region: Secondary | ICD-10-CM | POA: Diagnosis not present

## 2024-10-31 DIAGNOSIS — R2681 Unsteadiness on feet: Secondary | ICD-10-CM

## 2024-10-31 DIAGNOSIS — M4802 Spinal stenosis, cervical region: Secondary | ICD-10-CM | POA: Diagnosis not present

## 2024-10-31 DIAGNOSIS — G8929 Other chronic pain: Secondary | ICD-10-CM

## 2024-10-31 DIAGNOSIS — M50221 Other cervical disc displacement at C4-C5 level: Secondary | ICD-10-CM | POA: Diagnosis not present

## 2024-10-31 DIAGNOSIS — M542 Cervicalgia: Secondary | ICD-10-CM

## 2024-10-31 DIAGNOSIS — M50222 Other cervical disc displacement at C5-C6 level: Secondary | ICD-10-CM | POA: Diagnosis not present

## 2024-10-31 NOTE — Therapy (Signed)
 OUTPATIENT PHYSICAL THERAPY TREATMENT  Patient Name: Jonathon Snow MRN: [id number] DOB:1961-06-18, 63 y.o., male Today's Date: 10/31/2024  PCP: [name], PA-C REFERRING PROVIDER: [name], PA-C  END OF SESSION:  PT End of Session - 10/31/24 0802     Visit Number 15    Number of Visits 16    Date for Recertification  11/02/24    Authorization Type [place] Medicaid    Authorization Time Period 09/07/24-12/06/24 for 18 visits    Authorization - Visit Number 14    Authorization - Number of Visits 18    Progress Note Due on Visit 20    PT Start Time 0801    PT Stop Time 0841    PT Time Calculation (min) 40 min    Equipment Utilized During Treatment Gait belt    Activity Tolerance Patient tolerated treatment well;No increased pain;Patient limited by pain    Behavior During Therapy [facility] for tasks assessed/performed           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   Overview:  superior sagittal sinus, left transverse sinus and cortical veins    Closed left hip fracture, initial encounter (HCC) 02/18/2023   COPD (chronic obstructive pulmonary disease) (HCC)    COVID-19 virus infection 12/2020   Depression    DVT (deep venous thrombosis) (HCC)    Dyspnea    GERD (gastroesophageal reflux disease)    Head injury 11/05/2023   Heart attack (HCC) 11/05/2023   Heel spur, left 02/16/2019   Heel spur, right 02/16/2019   Hematoma of groin 11/08/2023   Herpes zoster infection of lumbar region 02/20/2020   History of DVT (deep vein thrombosis) 03/22/2020   History of kidney stones    HTN (hypertension)    Hyperlipidemia    Hypertension    Long term current use of systemic steroids 11/08/2020   Lupus    Lymphedema 10/07/2018    Morbid obesity (HCC)    Opiate abuse, episodic (HCC) 02/26/2018   Osteoporosis    Pain and swelling of right lower extremity 02/03/2023   Pneumonia    PONV (postoperative nausea and vomiting)    Postphlebitic syndrome with ulcer, 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   Presence of IVC filter 03/22/2020   Removed   Pulmonary embolism (HCC)    Renal disorder    Stage III   Severe episode of recurrent major depressive disorder, 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   Shock circulatory (HCC) 11/06/2023   Sleep apnea    STEMI (ST elevation myocardial infarction) (HCC) 11/05/2023   Past Surgical History:  Procedure Laterality Date   ANKLE SURGERY Right    BACK SURGERY     BRONCHIAL WASHINGS N/A 11/01/2021   Procedure: BRONCHIAL WASHINGS;  Surgeon: Parris Manna, MD;  Location: [facility] ORS;  Service: Thoracic;  Laterality: N/A;   COLONOSCOPY WITH PROPOFOL  N/A 05/28/2020   Procedure: COLONOSCOPY WITH PROPOFOL ;  Surgeon: Therisa Bi, MD;  Location: [facility] ENDOSCOPY;  Service: Endoscopy;  Laterality: N/A;   COLONOSCOPY WITH PROPOFOL  N/A 07/01/2023   Procedure: COLONOSCOPY WITH PROPOFOL ;  Surgeon: Therisa Bi, MD;  Location: [facility] ENDOSCOPY;  Service: Gastroenterology;  Laterality: N/A;   CORONARY ULTRASOUND/IVUS N/A 11/05/2023   Procedure: Coronary Ultrasound/IVUS;  Surgeon: Mady Bruckner, MD;  Location: [facility]  INVASIVE CV LAB;  Service: Cardiovascular;  Laterality: N/A;   CORONARY/GRAFT ACUTE MI REVASCULARIZATION N/A 11/05/2023   Procedure: Coronary/Graft Acute MI Revascularization;  Surgeon: Mady Bruckner, MD;  Location: [facility] INVASIVE CV LAB;  Service: Cardiovascular;  Laterality: N/A;   CYST EXCISION  92 or 93    Liver cyst removal [facility]   FLEXIBLE BRONCHOSCOPY N/A 11/01/2021   Procedure: FLEXIBLE BRONCHOSCOPY;  Surgeon: Parris Manna, MD;  Location: [facility] ORS;  Service: Thoracic;  Laterality: N/A;   FRACTURE SURGERY     HIP PINNING,CANNULATED Left 02/19/2023   Procedure: PERCUTANEOUS FIXATION OF  FEMORAL NECK;  Surgeon: Tobie Priest, MD;  Location: [facility] ORS;  Service: Orthopedics;  Laterality: Left;   I & D EXTREMITY Right 04/29/2017   Procedure: IRRIGATION AND DEBRIDEMENT EXTREMITY;  Surgeon: Shelva Dunnings, MD;  Location: [facility] ORS;  Service: General;  Laterality: Right;   IRRIGATION AND DEBRIDEMENT ABSCESS Left 04/29/2017   Procedure: IRRIGATION AND DEBRIDEMENT Scrotal ABSCESS;  Surgeon: Shelva Dunnings, MD;  Location: [facility] ORS;  Service: General;  Laterality: Left;   LEFT HEART CATH AND CORONARY ANGIOGRAPHY N/A 11/05/2023   Procedure: LEFT HEART CATH AND CORONARY ANGIOGRAPHY;  Surgeon: Mady Bruckner, MD;  Location: [facility] INVASIVE CV LAB;  Service: Cardiovascular;  Laterality: N/A;   POLYPECTOMY  07/01/2023   Procedure: POLYPECTOMY;  Surgeon: Therisa Bi, MD;  Location: [facility] ENDOSCOPY;  Service: Gastroenterology;;   SPINE SURGERY     Patient Active Problem List   Diagnosis Date Noted   B12 deficiency 07/28/2024   Vitamin D  deficiency 07/28/2024   Cervical radicular pain 05/26/2024   History of thoracic spinal fusion (T7-T12) 05/26/2024   History of lumbar spinal fusion (L5-S1) 05/26/2024   Chronic pain syndrome 05/26/2024   Anxiety and depression 02/26/2024   Dyslipidemia 02/26/2024   Peripheral neuropathy 02/26/2024   Essential hypertension 02/26/2024   Vertigo 02/26/2024   Orthostatic hypotension 02/11/2024   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) 01/20/2024   Anxiety 12/14/2023   Overweight (BMI 25.0-29.9) 11/23/2023   Chronic heart failure with preserved ejection fraction (HFpEF) (HCC) 11/23/2023   BPH (benign prostatic hyperplasia) 11/23/2023   Unstable angina (HCC) 11/10/2023   Closed T10 spinal fracture (HCC) 11/06/2023   Closed tricolumnar fracture of thoracic vertebra (HCC) 11/06/2023   Thoracic spine instability 11/06/2023   Chronic bilateral low back pain with left-sided sciatica 07/07/2023   Neuropathy 07/07/2023   Hx of  colonic polyps 07/01/2023   Adenomatous polyp of colon 07/01/2023   Erectile disorder 01/22/2023   Coronary artery disease involving native coronary artery of native heart without angina pectoris 09/09/2022   Varicose 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 01/03/2022   Immunocompromised state due to drug therapy 09/18/2021   Prediabetes 11/08/2020   Stage 3a chronic kidney disease (HCC) 11/08/2020   Seasonal allergies 11/08/2020   Rhinosinusitis 11/08/2020   Anticoagulant disorder 11/07/2020   Senile purpura 11/07/2020   MDD (major depressive disorder), recurrent episode, mild 09/12/2019   Other spondylosis with radiculopathy, lumbar region 02/16/2019   Osteoporosis 10/12/2018   Chronic venous insufficiency 10/07/2018   Lymphedema 10/07/2018   SLE glomerulonephritis syndrome, 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HCC) 03/03/2018   Syncope and collapse 12/29/2017   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) 10/29/2016   Hyperlipidemia LDL goal <70 01/15/2016   Obesity (BMI 30.0-34.9) 01/15/2016   Long term current use of anticoagulant 10/04/2015   Systemic lupus erythematosus (HCC) 05/30/2015   COPD, moderate (HCC) 06/27/2014  History of pulmonary embolism 12/11/2013   Nodule of right lung 12/11/2013   Cerebral venous sinus thrombosis 08/21/2013   Cystic disease of liver 08/21/2013   ONSET DATE: November 2024   REFERRING DIAG: imbalance  THERAPY DIAG:  Unsteadiness on feet  Cervicalgia  Other low back pain  Difficulty in walking, not elsewhere classified  Rationale for Evaluation and Treatment: Rehabilitation  SUBJECTIVE:                                                                                                                                                                                            SUBJECTIVE STATEMENT:  Pt still having intermittent cental CP, but insurance not covering a nuclear CT, hence he get a nuclear  stress test instead soon. No central chest pain issues right now. Pt still supposed to have at cervical injection in a week, has stopped his plavix . Pt feels like he has made progress thus far, but no where he wants to be and far from baseline. Pt would like to recert for more visits when his cert ends on 11/02/24. Pt has not had bu t1 day in the past few weeks where he felt orthostatic/hypotensive.   PERTINENT HISTORY:   62yoM referred to OPPT for imbalance. Pt just finished OPPT for post surgical neck pain on 08/30/24 at [facility] so he could begin his balance therapy here. Pt has had issues with chronic orthostatic hypotension repeated syncope, fluctuating intolerance to upright. Pt has become quite good at managing his safety at home with symptoms fluctuations. PT checks BP daily, has PRN meds for excessively low or high pressures.   PAIN:  Are you having pain? 6/10  neck/shoulders; 6/10 low back, no central chest pain   PRECAUTIONS: Fall WEIGHT BEARING RESTRICTIONS: No FALLS: Has patient fallen in last 6 months? Yes 1x in March 2025  LIVING ENVIRONMENT: Lives with: Alone  Lives in: 1 level  Stairs: threshold only Has following equipment at home: RW, canes, walking sticks.   PLOF: 2 years ago was able to go hiking for pleasure ab lib; used to get in and out kayak and do multi day treks overnight with kayak.   PATIENT GOALS: improve tolerance to upright mobility, stop falling as much   OBJECTIVE:  Note: Objective measures were completed at evaluation unless otherwise noted.   FUNCTIONAL TESTS:  TUG: 34sec (armless chair)  5xSTS: 54.9sec with BUE heavy use arm chair  : 14.63 Normal stance eyes closed: 9/22: 5-10sec for 3 bouts  Narrow stance eyes closed: 9/22: 17-18 sec for 3 bouts   PATIENT SURVEYS:  ABC Scale: 47.5%  TREATMENT DATE 10/31/24:   -overground AMB 900+ ft overground on lower level with Up walkers 39m46s  -AMB to [employer], no device   -Hoist Leg Press seat 12, level 5 x8 (increased this date)  -standing heel raises/toe raise x 20, BUE support on leg press -standing marching x30 bil BUE support on plinth  -Hoist Leg Press seat 12, level 5 x8  -standing heel raises/toe raise x 20, BUE support on leg press -standing marching x30 bil BUE support on plinth  -Hoist Leg Press seat 12, level 5 x8 -standing heel raises/toe raise x 20, BUE support on leg press -standing marching x30 bil BUE support on plinth  -narrow stance on foam pad, eyes open x3 minutes    PATIENT EDUCATION: Education details: activity modification when neck is flared up  Person educated: Programmer, Applications: Discussion, handout Education comprehension: Fair   HOME EXERCISE PROGRAM: Access Code: [email or web address] URL: [email or web address] Date: 09/12/2024 Prepared by: Massie Dollar  Exercises - sit to stand, arms crossed, 28-30 inch surface   - 3 x daily - 1 sets - 8 reps - Standing Near Stance in Corner with Eyes Closed  - 3 x daily - 1 sets - 5 reps - 30 hold - Semi-Tandem Balance at Counter Top Eyes Open  - 1 x daily - 7 x weekly - 3 sets - 4 reps - 15 hold - Standing March with Counter Support  - 1 x daily - 7 x weekly - 3 sets - 10 reps - Seated Long Arc Quad  - 1 x daily - 7 x weekly - 3 sets - 10 reps - Backward Walking with Counter Support  - 1 x daily - 7 x weekly - 3 sets - 5 reps  GOALS: Goals reviewed with patient? No  SHORT TERM GOALS: Target date: 10/07/24  Pt to report no falls in most recent 4 weeks to indicate good safety practices and symptoms management in the home.  Baseline: reports orthostatic s/s roughly 2x per week.  10/12/24: pt denies any falls in the last 4 weeks Goal status: MET  2.  Pt will demonstrate improvement in 5xSTS performance by >3 seconds.  Baseline: 54.9sec with BUE heavy use arm chair   10/12/24: 43.01 sec Goal status: INITIAL  3.  Pt to demonstrate improvement in by 20% and backwards by 25%  Baseline: 9/22: forward: 14.63 sec (0.42m/s). Backward: 49.04 sec 0.52m/s  10/12/24: 15.21 sec (0.66 m/s); 45.77 sec (0.22 m/s) Goal status: INITIAL   LONG TERM GOALS: Target date: 11/07/24  Pt to demonstrate tolerance of >1011ft sustained AMB c LRAD without presyncope, without LOB, and without increase in back pain >2/10 points NPRS.  Baseline: tolerated 324ft with mild-moderate increase in L hip and low back pain;  09/26/24: 471ft with Upwalker, 53m53s (no increased pain in back/hip)  10/12/24: 793' with Upwalker, 6 min (+2/10 pain increase in the lumbar region) Goal status: PROGRESSING  2.  5xSTS performance, standard chair, hands free <20sec; hands free from 19.5 surface height in <12sec.   Baseline:  54.9sec with BUE heavy use arm chair  10/12/24: 43.01 sec Goal status: INITIAL  3.  Eye closed in narrow stance x60sec (5x without LOB and without need for hand support).  Baseline: 4-10 sec  10/12/24: 42 sec Goal status: PROGRESSING  ASSESSMENT:  CLINICAL IMPRESSION: Pt having a relatively good day on arrival. Pt encouraged to continue with Upwalker here to advance overground AMB tolerance, today he covers 900+ft for the first time,  typically only tolerating 300-520ft, but wth device has no significant change in pain symptoms. Pt returned to leg press for focal, deep hip  range extension, an area of deficit has limited transfers from chair- he is able to again advance resistance today to level 5, a clear sign of progress. Pt remains motivated and hopeful and hopes to continue with PT as long as he has insurance authorization. Pt remains far off baseline despite progress made overall. Pt will continue to benefit from skilled therapy to address remaining deficits in order to improve overall QoL and return to PLOF.    OBJECTIVE IMPAIRMENTS: Decreased knowledge of  condition, decreased use of DME, decreased mobility, difficulty walking, decreased strength, decreased ROM. ACTIVITY LIMITATIONS: Lifting, standing, walking, squatting, transfers, locomotion level PARTICIPATION LIMITATIONS: Cleaning, laundry, interpersonal relationships, driving, yardwork, community activity.  PERSONAL FACTORS: Age, behavior pattern, education, past/current experiences, transportation, profession  are also affecting patient's functional outcome.  REHAB POTENTIAL: Good CLINICAL DECISION MAKING: Medium  EVALUATION COMPLEXITY: Moderate    PLAN: PT FREQUENCY: 1-2x/week PT DURATION: 8 weeks PLANNED INTERVENTIONS: 97110-Therapeutic exercises, 97530- Therapeutic activity, 97112- Neuromuscular re-education, 97535- Self Care, [id number]- Manual therapy, [phone]- Gait training, [phone]- Electrical stimulation (unattended), [phone]- Electrical stimulation (manual), Patient/Family education, Balance training, Stair training, Cryotherapy, and Moist heat  PLAN FOR NEXT SESSION:  -continued leg press,  -overground balance training with walking sparingly due to easily provoked back issues -continue overground AMB of 4-5 laps with upwalker, start adding 2-3lb AW   8:37 AM, 10/31/24 Peggye JAYSON Linear, PT, DPT Physical Therapist - [facility] [place]  Outpatient Physical Therapy- Main Campus [phone]

## 2024-11-02 ENCOUNTER — Ambulatory Visit

## 2024-11-02 ENCOUNTER — Telehealth: Payer: Self-pay | Admitting: Internal Medicine

## 2024-11-02 DIAGNOSIS — R2681 Unsteadiness on feet: Secondary | ICD-10-CM | POA: Diagnosis not present

## 2024-11-02 DIAGNOSIS — I951 Orthostatic hypotension: Secondary | ICD-10-CM | POA: Diagnosis not present

## 2024-11-02 DIAGNOSIS — M5412 Radiculopathy, cervical region: Secondary | ICD-10-CM

## 2024-11-02 DIAGNOSIS — M5459 Other low back pain: Secondary | ICD-10-CM

## 2024-11-02 DIAGNOSIS — R42 Dizziness and giddiness: Secondary | ICD-10-CM | POA: Diagnosis not present

## 2024-11-02 DIAGNOSIS — M542 Cervicalgia: Secondary | ICD-10-CM

## 2024-11-02 DIAGNOSIS — R262 Difficulty in walking, not elsewhere classified: Secondary | ICD-10-CM | POA: Diagnosis not present

## 2024-11-02 DIAGNOSIS — I890 Lymphedema, not elsewhere classified: Secondary | ICD-10-CM | POA: Diagnosis not present

## 2024-11-02 DIAGNOSIS — M5416 Radiculopathy, lumbar region: Secondary | ICD-10-CM | POA: Diagnosis not present

## 2024-11-02 MED ORDER — ENOXAPARIN SODIUM 150 MG/ML IJ SOSY: 150.0000 mg | PREFILLED_SYRINGE | INTRAMUSCULAR | 0 refills | Status: DC

## 2024-11-02 NOTE — Therapy (Signed)
 OUTPATIENT PHYSICAL THERAPY TREATMENT/PT RECERT  Patient Name: Jonathon Snow MRN: [id number] DOB:1961-10-14, 63 y.o., male Today's Date: 11/03/2024  PCP: [name], PA-C REFERRING PROVIDER: [name], PA-C  END OF SESSION:  PT End of Session - 11/02/24 0754     Visit Number 16    Number of Visits 40    Date for Recertification  01/25/25    Authorization Type [place] Medicaid    Authorization Time Period 09/07/24-12/06/24 for 18 visits    Authorization - Number of Visits 18    Progress Note Due on Visit 20    PT Start Time 0801    PT Stop Time 0844    PT Time Calculation (min) 43 min    Equipment Utilized During Treatment Gait belt    Activity Tolerance Patient tolerated treatment well;No increased pain;Patient limited by pain    Behavior During Therapy [facility] for tasks assessed/performed           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   Overview:  superior sagittal sinus, left transverse sinus and cortical veins    Closed left hip fracture, initial encounter (HCC) 02/18/2023   COPD (chronic obstructive pulmonary disease) (HCC)    COVID-19 virus infection 12/2020   Depression    DVT (deep venous thrombosis) (HCC)    Dyspnea    GERD (gastroesophageal reflux disease)    Head injury 11/05/2023   Heart attack (HCC) 11/05/2023   Heel spur, left 02/16/2019   Heel spur, right 02/16/2019   Hematoma of groin 11/08/2023   Herpes zoster infection of lumbar region 02/20/2020   History of DVT (deep vein thrombosis) 03/22/2020   History of kidney stones    HTN (hypertension)    Hyperlipidemia    Hypertension    Long term current use of systemic steroids 11/08/2020   Lupus    Lymphedema 10/07/2018   Morbid obesity (HCC)     Opiate abuse, episodic (HCC) 02/26/2018   Osteoporosis    Pain and swelling of right lower extremity 02/03/2023   Pneumonia    PONV (postoperative nausea and vomiting)    Postphlebitic syndrome with ulcer, 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   Presence of IVC filter 03/22/2020   Removed   Pulmonary embolism (HCC)    Renal disorder    Stage III   Severe episode of recurrent major depressive disorder, 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   Shock circulatory (HCC) 11/06/2023   Sleep apnea    STEMI (ST elevation myocardial infarction) (HCC) 11/05/2023   Past Surgical History:  Procedure Laterality Date   ANKLE SURGERY Right    BACK SURGERY     BRONCHIAL WASHINGS N/A 11/01/2021   Procedure: BRONCHIAL WASHINGS;  Surgeon: Parris Manna, MD;  Location: [facility] ORS;  Service: Thoracic;  Laterality: N/A;   COLONOSCOPY WITH PROPOFOL  N/A 05/28/2020   Procedure: COLONOSCOPY WITH PROPOFOL ;  Surgeon: Therisa Bi, MD;  Location: [facility] ENDOSCOPY;  Service: Endoscopy;  Laterality: N/A;   COLONOSCOPY WITH PROPOFOL  N/A 07/01/2023   Procedure: COLONOSCOPY WITH PROPOFOL ;  Surgeon: Therisa Bi, MD;  Location: [facility] ENDOSCOPY;  Service: Gastroenterology;  Laterality: N/A;   CORONARY ULTRASOUND/IVUS N/A 11/05/2023   Procedure: Coronary Ultrasound/IVUS;  Surgeon: Mady Bruckner, MD;  Location: [facility] INVASIVE CV LAB;  Service: Cardiovascular;  Laterality: N/A;   CORONARY/GRAFT ACUTE MI REVASCULARIZATION N/A 11/05/2023   Procedure: Coronary/Graft Acute MI Revascularization;  Surgeon: Mady Bruckner, MD;  Location: [facility] INVASIVE CV LAB;  Service: Cardiovascular;  Laterality: N/A;   CYST EXCISION  92 or 93    Liver cyst removal [facility]   FLEXIBLE BRONCHOSCOPY N/A 11/01/2021   Procedure: FLEXIBLE BRONCHOSCOPY;  Surgeon: Parris Manna, MD;  Location: [facility] ORS;  Service: Thoracic;  Laterality: N/A;   FRACTURE SURGERY     HIP PINNING,CANNULATED Left 02/19/2023   Procedure: PERCUTANEOUS FIXATION OF FEMORAL NECK;  Surgeon:  Tobie Priest, MD;  Location: [facility] ORS;  Service: Orthopedics;  Laterality: Left;   I & D EXTREMITY Right 04/29/2017   Procedure: IRRIGATION AND DEBRIDEMENT EXTREMITY;  Surgeon: Shelva Dunnings, MD;  Location: [facility] ORS;  Service: General;  Laterality: Right;   IRRIGATION AND DEBRIDEMENT ABSCESS Left 04/29/2017   Procedure: IRRIGATION AND DEBRIDEMENT Scrotal ABSCESS;  Surgeon: Shelva Dunnings, MD;  Location: [facility] ORS;  Service: General;  Laterality: Left;   LEFT HEART CATH AND CORONARY ANGIOGRAPHY N/A 11/05/2023   Procedure: LEFT HEART CATH AND CORONARY ANGIOGRAPHY;  Surgeon: Mady Bruckner, MD;  Location: [facility] INVASIVE CV LAB;  Service: Cardiovascular;  Laterality: N/A;   POLYPECTOMY  07/01/2023   Procedure: POLYPECTOMY;  Surgeon: Therisa Bi, MD;  Location: [facility] ENDOSCOPY;  Service: Gastroenterology;;   SPINE SURGERY     Patient Active Problem List   Diagnosis Date Noted   B12 deficiency 07/28/2024   Vitamin D  deficiency 07/28/2024   Cervical radicular pain 05/26/2024   History of thoracic spinal fusion (T7-T12) 05/26/2024   History of lumbar spinal fusion (L5-S1) 05/26/2024   Chronic pain syndrome 05/26/2024   Anxiety and depression 02/26/2024   Dyslipidemia 02/26/2024   Peripheral neuropathy 02/26/2024   Essential hypertension 02/26/2024   Vertigo 02/26/2024   Orthostatic hypotension 02/11/2024   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) 01/20/2024   Anxiety 12/14/2023   Overweight (BMI 25.0-29.9) 11/23/2023   Chronic heart failure with preserved ejection fraction (HFpEF) (HCC) 11/23/2023   BPH (benign prostatic hyperplasia) 11/23/2023   Unstable angina (HCC) 11/10/2023   Closed T10 spinal fracture (HCC) 11/06/2023   Closed tricolumnar fracture of thoracic vertebra (HCC) 11/06/2023   Thoracic spine instability 11/06/2023   Chronic bilateral low back pain with left-sided sciatica 07/07/2023   Neuropathy 07/07/2023   Hx of colonic polyps 07/01/2023    Adenomatous polyp of colon 07/01/2023   Erectile disorder 01/22/2023   Coronary artery disease involving native coronary artery of native heart without angina pectoris 09/09/2022   Varicose 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 01/03/2022   Immunocompromised state due to drug therapy 09/18/2021   Prediabetes 11/08/2020   Stage 3a chronic kidney disease (HCC) 11/08/2020   Seasonal allergies 11/08/2020   Rhinosinusitis 11/08/2020   Anticoagulant disorder 11/07/2020   Senile purpura 11/07/2020   MDD (major depressive disorder), recurrent episode, mild 09/12/2019   Other spondylosis with radiculopathy, lumbar region 02/16/2019   Osteoporosis 10/12/2018   Chronic venous insufficiency 10/07/2018   Lymphedema 10/07/2018   SLE glomerulonephritis syndrome, 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HCC) 03/03/2018   Syncope and collapse 12/29/2017   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) 10/29/2016   Hyperlipidemia LDL goal <70 01/15/2016   Obesity (BMI 30.0-34.9) 01/15/2016   Long term current use of anticoagulant 10/04/2015   Systemic lupus erythematosus (HCC) 05/30/2015   COPD, moderate (HCC) 06/27/2014   History of pulmonary embolism 12/11/2013  Nodule of right lung 12/11/2013   Cerebral venous sinus thrombosis 08/21/2013   Cystic disease of liver 08/21/2013   ONSET DATE: November 2024   REFERRING DIAG: imbalance  THERAPY DIAG:  Unsteadiness on feet - Plan: PT plan of care cert/re-cert  Cervicalgia - Plan: PT plan of care cert/re-cert  Other low back pain - Plan: PT plan of care cert/re-cert  Difficulty in walking, not elsewhere classified - Plan: PT plan of care cert/re-cert  Syncope due to orthostatic hypotension - Plan: PT plan of care cert/re-cert  Radiculopathy, lumbar region - Plan: PT plan of care cert/re-cert  Dizziness and giddiness - Plan: PT plan of care cert/re-cert  Radiculopathy, cervical region - Plan: PT plan of care  cert/re-cert  Rationale for Evaluation and Treatment: Rehabilitation  SUBJECTIVE:                                                                                                                                                                                            SUBJECTIVE STATEMENT:  Patient reports doing okay- trying to work hard and make progress- complaining of ongoing Neck, bilateral shoulder, and low back down to left knee today- Rates at 6/10 - taking hydrocodone  and gabapentin .   From previous visit:  Pt still having intermittent cental CP, but insurance not covering a nuclear CT, hence he get a nuclear stress test instead soon. No central chest pain issues right now. Pt still supposed to have at cervical injection in a week, has stopped his plavix . Pt feels like he has made progress thus far, but no where he wants to be and far from baseline. Pt would like to recert for more visits when his cert ends on 11/02/24. Pt has not had bu t1 day in the past few weeks where he felt orthostatic/hypotensive.   PERTINENT HISTORY:   62yoM referred to OPPT for imbalance. Pt just finished OPPT for post surgical neck pain on 08/30/24 at [facility] so he could begin his balance therapy here. Pt has had issues with chronic orthostatic hypotension repeated syncope, fluctuating intolerance to upright. Pt has become quite good at managing his safety at home with symptoms fluctuations. PT checks BP daily, has PRN meds for excessively low or high pressures.   PAIN:  Are you having pain? 6/10  neck/shoulders; 6/10 low back, no central chest pain   PRECAUTIONS: Fall WEIGHT BEARING RESTRICTIONS: No FALLS: Has patient fallen in last 6 months? Yes 1x in March 2025  LIVING ENVIRONMENT: Lives with: Alone  Lives in: 1 level  Stairs: threshold only Has following equipment at home: RW, canes, walking sticks.   PLOF: 2  years ago was able to go hiking for pleasure ab lib; used to get in and out kayak and do  multi day treks overnight with kayak.   PATIENT GOALS: improve tolerance to upright mobility, stop falling as much   OBJECTIVE:  Note: Objective measures were completed at evaluation unless otherwise noted.   FUNCTIONAL TESTS:  TUG: 34sec (armless chair)  5xSTS: 54.9sec with BUE heavy use arm chair  : 14.63 Normal stance eyes closed: 9/22: 5-10sec for 3 bouts  Narrow stance eyes closed: 9/22: 17-18 sec for 3 bouts   PATIENT SURVEYS:  ABC Scale: 47.5%                                                                                                                              TREATMENT DATE 11/02/24:   Physical therapy treatment session today consisted of completing assessment of goals and administration of testing as demonstrated and documented in flow sheet, treatment, and goals section of this note. Addition treatments may be found below.   Pt performed 5 time sit<>stand (5xSTS): 37.46 sec with BUE heavy support (>15 sec indicates increased fall risk)    Narrowed standing with eyes closed- Able to hold 60 sec  x 1 trial today but does endorse LBP   6 Min Walk Test:  Instructed patient to ambulate as quickly and as safely as possible for 6 minutes using LRAD. Patient was allowed to take standing rest breaks without stopping the test, but if the patient required a sitting rest break the clock would be stopped and the test would be over.  Results: 780 feet (237.7 meters, Avg speed 0.66 m/s) using no AD with CGA. Results indicate that the patient has reduced endurance with ambulation compared to age matched norms.  Age Matched Norms: 76-69 yo M: 74 F: 37, 52-79 yo M: 89 F: 471, 23-89 yo M: 417 F: 392 MDC: 58.21 meters (190.98 feet) or 50 meters (ANPTA Core Set of Outcome Measures for Adults with Neurologic Conditions, 2018)    [facility] PT Assessment - 11/02/24 0837       Standardized Balance Assessment   Standardized Balance Assessment Berg Balance Test      Berg Balance Test    Sit to Stand Able to stand  independently using hands    Standing Unsupported Able to stand 30 seconds unsupported    Sitting with Back Unsupported but Feet Supported on Floor or Stool Able to sit safely and securely 2 minutes    Stand to Sit Controls descent by using hands    Transfers Able to transfer safely, definite need of hands    Standing Unsupported with Eyes Closed Able to stand 10 seconds with supervision    Standing Unsupported with Feet Together Able to place feet together independently and stand for 1 minute with supervision    From Standing, Reach Forward with Outstretched Arm Can reach forward >5 cm safely (2)    From Standing Position, Pick  up Object from Floor Unable to try/needs assist to keep balance    From Standing Position, Turn to Look Behind Over each Shoulder Turn sideways only but maintains balance    Turn 360 Degrees Able to turn 360 degrees safely but slowly    Standing Unsupported, Alternately Place Feet on Step/Stool Able to complete 4 steps without aid or supervision    Standing Unsupported, One Foot in Front Able to take small step independently and hold 30 seconds    Standing on One Leg Tries to lift leg/unable to hold 3 seconds but remains standing independently    Total Score 32              PATIENT EDUCATION: Education details: activity modification when neck is flared up  Person educated: Programmer, Applications: Discussion, handout Education comprehension: Fair   HOME EXERCISE PROGRAM: Access Code: [email or web address] URL: [email or web address] Date: 09/12/2024 Prepared by: Massie Dollar  Exercises - sit to stand, arms crossed, 28-30 inch surface   - 3 x daily - 1 sets - 8 reps - Standing Near Stance in Corner with Eyes Closed  - 3 x daily - 1 sets - 5 reps - 30 hold - Semi-Tandem Balance at Counter Top Eyes Open  - 1 x daily - 7 x weekly - 3 sets - 4 reps - 15 hold - Standing March with Counter Support  - 1 x daily - 7 x weekly - 3 sets  - 10 reps - Seated Long Arc Quad  - 1 x daily - 7 x weekly - 3 sets - 10 reps - Backward Walking with Counter Support  - 1 x daily - 7 x weekly - 3 sets - 5 reps  GOALS: Goals reviewed with patient? No  SHORT TERM GOALS: Target date: 10/07/24  Pt to report no falls in most recent 4 weeks to indicate good safety practices and symptoms management in the home.  Baseline: reports orthostatic s/s roughly 2x per week.  10/12/24: pt denies any falls in the last 4 weeks Goal status: MET  2.  Pt will demonstrate improvement in 5xSTS performance by >3 seconds.  Baseline: 54.9sec with BUE heavy use arm chair  10/12/24: 43.01 sec Goal status: MET  3.  Pt to demonstrate improvement in by 20% and backwards by 25%  Baseline: 9/22: forward: 14.63 sec (0.8m/s). Backward: 49.04 sec 0.28m/s  10/12/24: 15.21 sec (0.66 m/s); 45.77 sec (0.22 m/s) Goal status: ONGOING   LONG TERM GOALS: Target date: 01/25/25  Pt to demonstrate tolerance of >1078ft sustained AMB c LRAD without presyncope, without LOB, and without increase in back pain >2/10 points NPRS.  Baseline: tolerated 322ft with mild-moderate increase in L hip and low back pain;  09/26/24: 440ft with Upwalker, 60m53s (no increased pain in back/hip)  10/12/24: 793' with Upwalker, 6 min (+2/10 pain increase in the lumbar region) 11/02/2024= 780 feet with Upright walker- (6/10 LBP pain before, during, and after today)  Goal status: PROGRESSING  2.  5xSTS performance, standard chair, hands free <20sec; hands free from 19.5 surface height in <12sec.   Baseline:  54.9sec with BUE heavy use arm chair  10/12/24: 43.01 sec 11/02/2024: 37.46 sec with BUE heavy use arm Goal status: PROGRESING  3.  Eye closed in narrow stance x60sec (5x without LOB and without need for hand support).  Baseline: 4-10 sec  10/12/24: 42 sec 11/02/2024: 60 sec x 1 time Goal status: PROGRESSING  4. Pt will improve BERG by at least 3  points in order to demonstrate  clinically significant improvement in balance.    Baseline: 11/02/2024= 32/56  Goal status: New  ASSESSMENT:  CLINICAL IMPRESSION: Patient presents with good motivation for today's recert visit. He states LBP at 6/10 today and moving around okay- agreeable to all testing. He was able to demonstrate overall progress with reassessment of goals today. Pt having a relatively good day on arrival. Overall he is progressing with gait endurance and efficiency and encouraged to continue with Upright 4WW to progress his overall functional mobility. He also demonstrated some progress with functional LE strength as seen by improved 5xSTS results yet continues to rely heavily on BUE Support. Added a gait goal and assess BERG with patient demonstrating poor overall standing balance and will continue to benefit from overall instruction and training in LE strengthening and dynamic and static balance interventions.  Pt is  motivated and his condition has the potential to improve in response to therapy. Maximum improvement is yet to be obtained. The anticipated improvement is attainable and reasonable in a generally predictable time.  Pt will continue to benefit from skilled therapy to address remaining deficits in order to improve overall QoL and return to PLOF.    OBJECTIVE IMPAIRMENTS: Decreased knowledge of condition, decreased use of DME, decreased mobility, difficulty walking, decreased strength, decreased ROM. ACTIVITY LIMITATIONS: Lifting, standing, walking, squatting, transfers, locomotion level PARTICIPATION LIMITATIONS: Cleaning, laundry, interpersonal relationships, driving, yardwork, community activity.  PERSONAL FACTORS: Age, behavior pattern, education, past/current experiences, transportation, profession  are also affecting patient's functional outcome.  REHAB POTENTIAL: Good CLINICAL DECISION MAKING: Medium  EVALUATION COMPLEXITY: Moderate    PLAN: PT FREQUENCY: 1-2x/week PT DURATION: 12  weeks PLANNED INTERVENTIONS: 97110-Therapeutic exercises, 97530- Therapeutic activity, 97112- Neuromuscular re-education, 97535- Self Care, [id number]- Manual therapy, [phone]- Gait training, [phone]- Electrical stimulation (unattended), [phone]- Electrical stimulation (manual), Patient/Family education, Balance training, Stair training, Cryotherapy, and Moist heat  PLAN FOR NEXT SESSION:  -continued leg press,  -overground balance training with walking sparingly due to easily provoked back issues -continue overground AMB of 4-5 laps with upwalker, start adding 2-3lb AW   9:37 AM, 11/03/24 Chyrl London, PT Physical Therapist - [facility]  Outpatient Physical Therapy- Main Campus [phone]

## 2024-11-02 NOTE — Telephone Encounter (Signed)
 Patient called to ask if he needs to use lovenox  as a bridge before his surgery? His surgery is set for 11/17. He is requesting a call asap since he is needing to come off eliquis  soon to prepare for surgery.   Please call him [phone]

## 2024-11-02 NOTE — Telephone Encounter (Signed)
#   Prior to procedure- LAST day of Eliquis - 13th, NOV.   # Lovenox  1.5 mg/kfgSQ daily- Take it in AM- starting 14th- take on 15th & 16th- DO NOTTake on 17th, NOV [date of procedure].   # Re-start the eliquis  on NOv 18th-   GB

## 2024-11-03 ENCOUNTER — Telehealth: Payer: Self-pay | Admitting: Podiatry

## 2024-11-03 ENCOUNTER — Ambulatory Visit: Admitting: Nurse Practitioner

## 2024-11-03 DIAGNOSIS — F332 Major depressive disorder, recurrent severe without psychotic features: Secondary | ICD-10-CM | POA: Diagnosis not present

## 2024-11-03 NOTE — Telephone Encounter (Signed)
 Called to get status on orthotics. Are they here?

## 2024-11-04 ENCOUNTER — Encounter: Payer: Self-pay | Admitting: Nurse Practitioner

## 2024-11-04 ENCOUNTER — Telehealth: Payer: Self-pay | Admitting: Student in an Organized Health Care Education/Training Program

## 2024-11-04 NOTE — Telephone Encounter (Signed)
 Called patient and informed him that Dr lateef was not in today and to ask when he comes in for his appt.

## 2024-11-04 NOTE — Telephone Encounter (Signed)
 Patient is scheduled for procedure on Monday and med mgmt on Tuesday. Wants to know if Dr Marcelino will fill meds on Monday so he doesn't have to come back next day

## 2024-11-07 ENCOUNTER — Ambulatory Visit
Admission: RE | Admit: 2024-11-07 | Discharge: 2024-11-07 | Disposition: A | Discharge status: Home / Self Care | Source: Ambulatory Visit | Attending: Student in an Organized Health Care Education/Training Program | Admitting: Student in an Organized Health Care Education/Training Program

## 2024-11-07 ENCOUNTER — Ambulatory Visit

## 2024-11-07 ENCOUNTER — Ambulatory Visit (HOSPITAL_BASED_OUTPATIENT_CLINIC_OR_DEPARTMENT_OTHER): Admitting: Student in an Organized Health Care Education/Training Program

## 2024-11-07 ENCOUNTER — Encounter: Payer: Self-pay | Admitting: Student in an Organized Health Care Education/Training Program

## 2024-11-07 VITALS — BP 149/97 | HR 66 | Temp 97.7°F | Resp 16 | Ht 74.0 in | Wt 221.0 lb

## 2024-11-07 DIAGNOSIS — Z79899 Other long term (current) drug therapy: Secondary | ICD-10-CM | POA: Diagnosis not present

## 2024-11-07 DIAGNOSIS — G894 Chronic pain syndrome: Secondary | ICD-10-CM

## 2024-11-07 DIAGNOSIS — M5412 Radiculopathy, cervical region: Secondary | ICD-10-CM | POA: Insufficient documentation

## 2024-11-07 DIAGNOSIS — M47812 Spondylosis without myelopathy or radiculopathy, cervical region: Secondary | ICD-10-CM

## 2024-11-07 MED ORDER — MIDAZOLAM HCL 2 MG/2ML IJ SOLN
INTRAMUSCULAR | Status: AC
Start: 2024-11-07 — End: 2024-11-07
  Filled 2024-11-07: qty 2

## 2024-11-07 MED ORDER — DEXAMETHASONE SOD PHOSPHATE PF 10 MG/ML IJ SOLN
20.0000 mg | Freq: Once | INTRAMUSCULAR | Status: AC
  Administered 2024-11-07: 20 mg

## 2024-11-07 MED ORDER — LIDOCAINE HCL 2 % IJ SOLN
20.0000 mL | Freq: Once | INTRAMUSCULAR | Status: AC
  Administered 2024-11-07: 200 mg

## 2024-11-07 MED ORDER — LIDOCAINE HCL (PF) 2 % IJ SOLN
INTRAMUSCULAR | Status: AC
Start: 2024-11-07 — End: 2024-11-07
  Filled 2024-11-07: qty 10

## 2024-11-07 MED ORDER — MIDAZOLAM HCL (PF) 2 MG/2ML IJ SOLN
0.5000 mg | Freq: Once | INTRAMUSCULAR | Status: AC
  Administered 2024-11-07: 2 mg via INTRAVENOUS

## 2024-11-07 MED ORDER — ROPIVACAINE HCL 2 MG/ML IJ SOLN
INTRAMUSCULAR | Status: AC
Start: 2024-11-07 — End: 2024-11-07
  Filled 2024-11-07: qty 20

## 2024-11-07 MED ORDER — LACTATED RINGERS IV SOLN: Freq: Once | INTRAVENOUS | Status: DC

## 2024-11-07 MED ORDER — HYDROCODONE-ACETAMINOPHEN 5-325 MG PO TABS: 1.0000 | ORAL_TABLET | Freq: Three times a day (TID) | ORAL | 0 refills | Status: AC | PRN

## 2024-11-07 MED ORDER — ROPIVACAINE HCL 2 MG/ML IJ SOLN
18.0000 mL | Freq: Once | INTRAMUSCULAR | Status: AC
  Administered 2024-11-07: 18 mL via PERINEURAL

## 2024-11-07 MED ORDER — HYDROCODONE-ACETAMINOPHEN 5-325 MG PO TABS: 1.0000 | ORAL_TABLET | Freq: Three times a day (TID) | ORAL | 0 refills | Status: DC | PRN

## 2024-11-07 NOTE — Patient Instructions (Signed)
 ______________________________________________________________________    Post-Procedure Discharge Instructions  INSTRUCTIONS Apply ice:  Purpose: This will minimize any swelling and discomfort after procedure.  When: Day of procedure, as soon as you get home. How: Fill a plastic sandwich bag with crushed ice. Cover it with a small towel and apply to injection site. How long: (15 min on, 15 min off) Apply for 15 minutes then remove x 15 minutes.  Repeat sequence on day of procedure, until you go to bed. Apply heat:  Purpose: To treat any soreness and discomfort from the procedure. When: Starting the next day after the procedure. How: Apply heat to procedure site starting the day following the procedure. How long: May continue to repeat daily, until discomfort goes away. Food intake: Start with clear liquids (like water) and advance to regular food, as tolerated.  Physical activities: Keep activities to a minimum for the first 8 hours after the procedure. After that, then as tolerated. Driving: If you have received any sedation, be responsible and do not drive. You are not allowed to drive for 24 hours after having sedation. Blood thinner: (Applies only to those taking blood thinners) You may restart your blood thinner 6 hours after your procedure. Insulin: (Applies only to Diabetic patients taking insulin) As soon as you can eat, you may resume your normal dosing schedule. Infection prevention: Keep procedure site clean and dry. Shower daily and clean area with soap and water.  PAIN DIARY Post-procedure Pain Diary: Extremely important that this be done correctly and accurately. Recorded information will be used to determine the next step in treatment. For the purpose of accuracy, follow these rules: Evaluate only the area treated. Do not report or include pain from an untreated area. For the purpose of this evaluation, ignore all other areas of pain, except for the treated area. After your  procedure, avoid taking a long nap and attempting to complete the pain diary after you wake up. Instead, set your alarm clock to go off every hour, on the hour, for the initial 8 hours after the procedure. Document the duration of the numbing medicine, and the relief you are getting from it. Do not go to sleep and attempt to complete it later. It will not be accurate. If you received sedation, it is likely that you were given a medication that may cause amnesia. Because of this, completing the diary at a later time may cause the information to be inaccurate. This information is needed to plan your care. Follow-up appointment: Keep your post-procedure follow-up evaluation appointment after the procedure (usually 2 weeks for most procedures, 6 weeks for radiofrequencies). DO NOT FORGET to bring you pain diary with you.   EXPECT... (What should I expect to see with my procedure?) From numbing medicine (AKA: Local Anesthetics): Numbness or decrease in pain. You may also experience some weakness, which if present, could last for the duration of the local anesthetic. Onset: Full effect within 15 minutes of injected. Duration: It will depend on the type of local anesthetic used. On the average, 1 to 8 hours.  From steroids (Applies only if steroids were used): Decrease in swelling or inflammation. Once inflammation is improved, relief of the pain will follow. Onset of benefits: Depends on the amount of swelling present. The more swelling, the longer it will take for the benefits to be seen. In some cases, up to 10 days. Duration: Steroids will stay in the system x 2 weeks. Duration of benefits will depend on multiple posibilities including persistent irritating  factors. Side-effects: If present, they may typically last 2 weeks (the duration of the steroids). Frequent: Cramps (if they occur, drink Gatorade and take over-the-counter Magnesium 450-500 mg once to twice a day); water retention with temporary weight  gain; increases in blood sugar; decreased immune system response; increased appetite. Occasional: Facial flushing (red, warm cheeks); mood swings; menstrual changes. Uncommon: Long-term decrease or suppression of natural hormones; bone thinning. (These are more common with higher doses or more frequent use. This is why we prefer that our patients avoid having any injection therapies in other practices.)  Very Rare: Severe mood changes; psychosis; aseptic necrosis. From procedure: Some discomfort is to be expected once the numbing medicine wears off. This should be minimal if ice and heat are applied as instructed.  CALL IF... (When should I call?) You experience numbness and weakness that gets worse with time, as opposed to wearing off. New onset bowel or bladder incontinence. (Applies only to procedures done in the spine)  Emergency Numbers: Durning business hours (Monday - Thursday, 8:00 AM - 4:00 PM) (Friday, 9:00 AM - 12:00 Noon): [phone] After hours: [phone] NOTE: If you are having a problem and are unable connect with, or to talk to a provider, then go to your nearest urgent care or emergency department. If the problem is serious and urgent, please call 911. ______________________________________________________________________

## 2024-11-07 NOTE — Progress Notes (Signed)
 PROVIDER NOTE: Interpretation of information contained herein should be left to medically-trained personnel. Specific patient instructions are provided elsewhere under Patient Instructions section of medical record. This document was created in part using STT-dictation technology, any transcriptional errors that may result from this process are unintentional.  Patient: Jonathon Snow Type: Established DOB: 02-13-1961 MRN: [id number] PCP: [name], PA-C  Service: Procedure DOS: 11/07/2024 Setting: Ambulatory Location: Ambulatory outpatient facility Delivery: Face-to-face Provider: Wallie Sherry, MD Specialty: Interventional Pain Management Specialty designation: 09 Location: Outpatient facility Ref. Prov.: Patel, Seema K, NP       Interventional Therapy   Procedure: Cervical Facet Medial Branch Block(s) #1  Laterality: Bilateral  Level: C4, C5, and C6 Medial Branch Level(s). Injecting these levels blocks the C4-5 and C5-6 cervical facet joints.  Imaging: Fluoroscopic guidance Anesthesia: Local anesthesia (1-2% Lidocaine ) Sedation: Minimal Sedation                       DOS: 11/07/2024  Performed by: Wallie Sherry, MD  Purpose: Diagnostic/Therapeutic Indications: Cervicalgia (cervical spine axial pain) severe enough to impact quality of life or function. 1. Chronic pain syndrome   2. Cervical facet joint syndrome   3. Cervical radicular pain   4. Medication management    NAS-11 Pain score:   Pre-procedure: 6 /10   Post-procedure: 5 /10     Position / Prep / Materials:  Position: Prone. Head in cradle. C-spine slightly flexed. Prep solution: ChloraPrep (2% chlorhexidine  gluconate and 70% isopropyl alcohol) Prep Area: Posterior Cervico-thoracic Region. From occipital ridge to tip of scapula, and from shoulder to shoulder. Entire posterior and lateral neck surface. Materials:  Tray: Block Needle(s):  Type: Spinal  Gauge (G): 22  Length: 3.5-in  Qty:  4     H&P (Pre-op  Assessment):  Jonathon Snow is a 63 y.o. (year old), male patient, seen today for interventional treatment. He  has a past surgical history that includes Cyst excision (92 or 93 ); Ankle surgery (Right); I & D extremity (Right, 04/29/2017); Irrigation and debridement abscess (Left, 04/29/2017); Colonoscopy with propofol  (N/A, 05/28/2020); Flexible bronchoscopy (N/A, 11/01/2021); Bronchial washings (N/A, 11/01/2021); Back surgery; Hip pinning, cannulated (Left, 02/19/2023); Colonoscopy with propofol  (N/A, 07/01/2023); polypectomy (07/01/2023); Coronary/Graft Acute MI Revascularization (N/A, 11/05/2023); LEFT HEART CATH AND CORONARY ANGIOGRAPHY (N/A, 11/05/2023); Coronary Ultrasound/IVUS (N/A, 11/05/2023); Fracture surgery; and Spine surgery. Jonathon Snow has a current medication list which includes the following prescription(s): albuterol , alendronate, apixaban , aripiprazole , azelastine, benlysta, buspirone , carbidopa-levodopa, cholecalciferol , ciprofloxacin , clonazepam , clopidogrel , cyanocobalamin , enoxaparin , famotidine , fluticasone , gabapentin , hydralazine , hydroxychloroquine, hydroxyzine , loratadine , losartan , meclizine , metoprolol  succinate, midodrine , montelukast , multivitamin with minerals, mycophenolate , oxybutynin , pantoprazole , rosuvastatin , sertraline , silodosin , solifenacin , journavx , syringe 3cc/25gx1, tadalafil , tamsulosin , terbinafine , hydrocodone -acetaminophen , and [START ON 12/07/2024] hydrocodone -acetaminophen , and the following Facility-Administered Medications: lactated ringers . His primarily concern today is the Neck Pain  Initial Vital Signs:  Pulse/HCG Rate: 75ECG Heart Rate: 65 Temp: 97.7 F (36.5 C) Resp: 10 BP: (!) 167/91 SpO2: 100 %  BMI: Estimated body mass index is 28.37 kg/m as calculated from the following:   Height as of this encounter: 6' 2 (1.88 m).   Weight as of this encounter: 221 lb (100.2 kg).  Risk Assessment: Allergies: Reviewed. He is allergic to enalapril and  vicodin [hydrocodone -acetaminophen ].  Allergy Precautions: None required Coagulopathies: Reviewed. None identified.  Blood-thinner therapy: None at this time Active Infection(s): Reviewed. None identified. Jonathon Snow is afebrile  Site Confirmation: Jonathon Snow was asked to confirm the procedure and laterality before marking the site Procedure checklist:  Completed Consent: Before the procedure and under the influence of no sedative(s), amnesic(s), or anxiolytics, the patient was informed of the treatment options, risks and possible complications. To fulfill our ethical and legal obligations, as recommended by the American Medical Association's Code of Ethics, I have informed the patient of my clinical impression; the nature and purpose of the treatment or procedure; the risks, benefits, and possible complications of the intervention; the alternatives, including doing nothing; the risk(s) and benefit(s) of the alternative treatment(s) or procedure(s); and the risk(s) and benefit(s) of doing nothing. The patient was provided information about the general risks and possible complications associated with the procedure. These may include, but are not limited to: failure to achieve desired goals, infection, bleeding, organ or nerve damage, allergic reactions, paralysis, and death. In addition, the patient was informed of those risks and complications associated to Spine-related procedures, such as failure to decrease pain; infection (i.e.: Meningitis, epidural or intraspinal abscess); bleeding (i.e.: epidural hematoma, subarachnoid hemorrhage, or any other type of intraspinal or peri-dural bleeding); organ or nerve damage (i.e.: Any type of peripheral nerve, nerve root, or spinal cord injury) with subsequent damage to sensory, motor, and/or autonomic systems, resulting in permanent pain, numbness, and/or weakness of one or several areas of the body; allergic reactions; (i.e.: anaphylactic reaction); and/or  death. Furthermore, the patient was informed of those risks and complications associated with the medications. These include, but are not limited to: allergic reactions (i.e.: anaphylactic or anaphylactoid reaction(s)); adrenal axis suppression; blood sugar elevation that in diabetics may result in ketoacidosis or comma; water  retention that in patients with history of congestive heart failure may result in shortness of breath, pulmonary edema, and decompensation with resultant heart failure; weight gain; swelling or edema; medication-induced neural toxicity; particulate matter embolism and blood vessel occlusion with resultant organ, and/or nervous system infarction; and/or aseptic necrosis of one or more joints. Finally, the patient was informed that Medicine is not an exact science; therefore, there is also the possibility of unforeseen or unpredictable risks and/or possible complications that may result in a catastrophic outcome. The patient indicated having understood very clearly. We have given the patient no guarantees and we have made no promises. Enough time was given to the patient to ask questions, all of which were answered to the patient's satisfaction. Jonathon Snow has indicated that he wanted to continue with the procedure. Attestation: I, the ordering provider, attest that I have discussed with the patient the benefits, risks, side-effects, alternatives, likelihood of achieving goals, and potential problems during recovery for the procedure that I have provided informed consent. Date  Time: 11/07/2024  8:58 AM  Pre-Procedure Preparation:  Monitoring: As per clinic protocol. Respiration, ETCO2, SpO2, BP, heart rate and rhythm monitor placed and checked for adequate function Safety Precautions: Patient was assessed for positional comfort and pressure points before starting the procedure. Time-out: I initiated and conducted the Time-out before starting the procedure, as per protocol. The  patient was asked to participate by confirming the accuracy of the Time Out information. Verification of the correct person, site, and procedure were performed and confirmed by me, the nursing staff, and the patient. Time-out conducted as per Joint Commission's Universal Protocol (UP.01.01.01). Time: 0951 Start Time: 0951 hrs.  Description/Narrative of Procedure:          Laterality: See above. Targeted Levels: See above.  Rationale (medical necessity): procedure needed and proper for the diagnosis and/or treatment of the patient's medical symptoms and needs. Procedural Technique Safety Precautions: Aspiration looking  for blood return was conducted prior to all injections. At no point did we inject any substances, as a needle was being advanced. No attempts were made at seeking any paresthesias. Safe injection practices and needle disposal techniques used. Medications properly checked for expiration dates. SDV (single dose vial) medications used. Description of the Procedure: Protocol guidelines were followed. The patient was assisted into a comfortable position. The target area was identified and the area prepped in the usual manner. Skin & deeper tissues infiltrated with local anesthetic. Appropriate amount of time allowed to pass for local anesthetics to take effect. The procedure needles were then advanced to the target area. Proper needle placement secured. Negative aspiration confirmed. Solution injected in intermittent fashion, asking for systemic symptoms every 0.5cc of injectate. The needles were then removed and the area cleansed, making sure to leave some of the prepping solution back to take advantage of its long term bactericidal properties.  Technical description of process:  C2 Medial Branch Nerve Block (MBB): The target area for the C2 dorsal medial articular branch is the lateral concave waist of the articular pillar of C2. Under fluoroscopic guidance, a Quincke needle was inserted  until contact was made with os over the postero-lateral aspect of the articular pillar of C2 (target area). After negative aspiration for blood, 0.5 mL of the nerve block solution was injected without difficulty or complication. The needle was removed intact. Third Occipital Nerve (TON) Block (MBB): The target area for the TON branch is the postero-lateral aspect of the C2-C3 articulation. Under fluoroscopic guidance, a Quincke needle was inserted until contact was made with os over the target area. After negative aspiration for blood, 0.5 mL of the nerve block solution was injected without difficulty or complication. The needle was removed intact. C3 Medial Branch Nerve Block (MBB): The target area for the C3 dorsal medial articular branch is the lateral concave waist of the articular pillar of C3. Under fluoroscopic guidance, a Quincke needle was inserted until contact was made with os over the postero-lateral aspect of the articular pillar of C3 (target area). After negative aspiration for blood, 0.5 mL of the nerve block solution was injected without difficulty or complication. The needle was removed intact. C4 Medial Branch Nerve Block (MBB): The target area for the C4 dorsal medial articular branch is the lateral concave waist of the articular pillar of C4. Under fluoroscopic guidance, a Quincke needle was inserted until contact was made with os over the postero-lateral aspect of the articular pillar of C4 (target area). After negative aspiration for blood, 0.5 mL of the nerve block solution was injected without difficulty or complication. The needle was removed intact. C5 Medial Branch Nerve Block (MBB): The target area for the C5 dorsal medial articular branch is the lateral concave waist of the articular pillar of C5. Under fluoroscopic guidance, a Quincke needle was inserted until contact was made with os over the postero-lateral aspect of the articular pillar of C5 (target area). After negative  aspiration for blood, 0.5 mL of the nerve block solution was injected without difficulty or complication. The needle was removed intact. C6 Medial Branch Nerve Block (MBB): The target area for the C6 dorsal medial articular branch is the lateral concave waist of the articular pillar of C6. Under fluoroscopic guidance, a Quincke needle was inserted until contact was made with os over the postero-lateral aspect of the articular pillar of C6 (target area). After negative aspiration for blood, 0.5 mL of the nerve block solution was injected  without difficulty or complication. The needle was removed intact. C7 Medial Branch Nerve Block (MBB): The target for the C7 dorsal medial articular branch lies on the superior-lateral tip of the C7 transverse process. Under fluoroscopic guidance, a Quincke needle was inserted until contact was made with os over the postero-lateral aspect of the articular pillar of C7 (target area). After negative aspiration for blood, 0.5 mL of the nerve block solution was injected without difficulty or complication. The needle was removed intact. C8 Medial Branch Nerve Block (MBB): The target for the C8 dorsal medial articular branch lies on the superior-lateral aspect of the T1 transverse process. Under fluoroscopic guidance, a Quincke needle was inserted until contact was made with os over the postero-lateral aspect of the articular pillar of T1 (target area). After negative aspiration for blood, 0.5 mL of the nerve block solution was injected without difficulty or complication. The needle was removed intact.  Once the entire procedure was completed, the treated area was cleaned, making sure to leave some of the prepping solution back to take advantage of its long term bactericidal properties.  Anatomy Reference Guide:      Facet Joint Innervation  C1-2 Third occipital Nerve (TON)  C2-3 TON, C3  Medial Branch  C3-4 C3, C4                     C4-5 C4, C5                     C5-6  C5, C6                     C6-7 C6, C7                     C7-T1 C7, C8                      Cervical Facet Pain Pattern overlap:   Vitals:   11/07/24 0952 11/07/24 0956 11/07/24 0959 11/07/24 1009  BP: (!) 184/106 (!) 160/110 (!) 168/107 (!) 149/97  Pulse:    66  Resp: 19 15 20 16   Temp:      SpO2: 100% 95% 100% 100%  Weight:      Height:         Start Time: 0951 hrs. End Time: 0958 hrs.  Imaging Guidance (Spinal):         Type of Imaging Technique: Fluoroscopy Guidance (Spinal) Indication(s): Fluoroscopy guidance for needle placement to enhance accuracy in procedures requiring precise needle localization for targeted delivery of medication in or near specific anatomical locations not easily accessible without such real-time imaging assistance. Exposure Time: Please see nurses notes. Contrast: None used. Fluoroscopic Guidance: I was personally present during the use of fluoroscopy. Tunnel Vision Technique used to obtain the best possible view of the target area. Parallax error corrected before commencing the procedure. Direction-depth-direction technique used to introduce the needle under continuous pulsed fluoroscopy. Once target was reached, antero-posterior, oblique, and lateral fluoroscopic projection used confirm needle placement in all planes. Images permanently stored in EMR. Interpretation: No contrast injected. I personally interpreted the imaging intraoperatively. Adequate needle placement confirmed in multiple planes. Permanent images saved into the patient's record.  Post-operative Assessment:  Post-procedure Vital Signs:  Pulse/HCG Rate: 6667 Temp: 97.7 F (36.5 C) Resp: 16 BP: (!) 149/97 SpO2: 100 %  EBL: None  Complications: No immediate post-treatment complications observed by team, or reported by patient.  Note:  The patient tolerated the entire procedure well. A repeat set of vitals were taken after the procedure and the patient was kept under  observation following institutional policy, for this type of procedure. Post-procedural neurological assessment was performed, showing return to baseline, prior to discharge. The patient was provided with post-procedure discharge instructions, including a section on how to identify potential problems. Should any problems arise concerning this procedure, the patient was given instructions to immediately contact us , at any time, without hesitation. In any case, we plan to contact the patient by telephone for a follow-up status report regarding this interventional procedure.  Comments:  No additional relevant information.  Plan of Care (POC)  Orders:  Orders Placed This Encounter  Procedures   DG PAIN CLIN[facility] C-ARM 1-60 MIN NO REPORT    Intraoperative interpretation by procedural physician at [facility].    Standing Status:   Standing    Number of Occurrences:   1    Reason for exam::   Assistance in needle guidance and placement for procedures requiring needle placement in or near specific anatomical locations not easily accessible without such assistance.   MM: Hydrocodone  5 mg TID prn Monitoring: [place] PMP: PDMP reviewed during this encounter.       Pharmacotherapy: No side-effects or adverse reactions reported. Compliance: No problems identified. Effectiveness: Clinically acceptable.  UDS:  Summary  Date Value Ref Range Status  07/19/2024 FINAL  Final    Comment:    ==================================================================== Compliance Drug Analysis, Ur ==================================================================== Test                             Result       Flag       Units  Drug Present and Declared for Prescription Verification   Metoprolol                      PRESENT      EXPECTED  Drug Present not Declared for Prescription Verification   Tramadol                        166          UNEXPECTED ng/mg creat   O-Desmethyltramadol            1291          UNEXPECTED ng/mg creat    Source of tramadol  is a prescription medication. O-desmethyltramadol    is an expected metabolite of tramadol .  Drug Absent but Declared for Prescription Verification   Clonazepam                      Not Detected UNEXPECTED ng/mg creat   Sertraline                      Not Detected UNEXPECTED   Aripiprazole                    Not Detected UNEXPECTED ==================================================================== Test                      Result    Flag   Units      Ref Range   Creatinine              94               mg/dL      >=79 ==================================================================== Declared Medications:  The  flagging and interpretation on this report are based on the  following declared medications.  Unexpected results may arise from  inaccuracies in the declared medications.   **Note: The testing scope of this panel includes these medications:   Aripiprazole  (Abilify )  Clonazepam  (Klonopin )  Metoprolol   Sertraline  (Zoloft )   **Note: The testing scope of this panel does not include the  following reported medications:   Apixaban  (Eliquis )  Azelastine (Astelin)  Belimumab (Benlysta)  Carbidopa (Sinemet)  Clopidogrel  (Plavix )  Levodopa (Sinemet)  Midodrine  (Proamatine )  Montelukast  (Singulair )  Multivitamin  Mycophenolate  mofetil (Cellcept )  Oxybutynin  (Ditropan )  Pantoprazole  (Protonix )  Rosuvastatin  (Crestor )  Solifenacin  (Vesicare )  Vitamin B12  Vitamin D3 ==================================================================== For clinical consultation, please call [phone]. ====================================================================      Medications ordered for procedure: Meds ordered this encounter  Medications   lidocaine  (XYLOCAINE ) 2 % (with pres) injection 400 mg   lactated ringers  infusion   midazolam  PF (VERSED ) injection 0.5-2 mg    Make sure Flumazenil is available in the pyxis when using  this medication. If oversedation occurs, administer 0.2 mg IV over 15 sec. If after 45 sec no response, administer 0.2 mg again over 1 min; may repeat at 1 min intervals; not to exceed 4 doses (1 mg)   ropivacaine  (PF) 2 mg/mL (0.2%) (NAROPIN ) injection 18 mL   dexamethasone  (DECADRON ) injection 20 mg   HYDROcodone -acetaminophen  (NORCO/VICODIN) 5-325 MG tablet    Sig: Take 1 tablet by mouth every 8 (eight) hours as needed for severe pain (pain score 7-10). Must last 30 days    Dispense:  90 tablet    Refill:  0    Chronic Pain: STOP Act (Not applicable) Fill 1 day early if closed on refill date. Avoid benzodiazepines within 8 hours of opioids   HYDROcodone -acetaminophen  (NORCO/VICODIN) 5-325 MG tablet    Sig: Take 1 tablet by mouth every 8 (eight) hours as needed for severe pain (pain score 7-10). Must last 30 days    Dispense:  90 tablet    Refill:  0    Chronic Pain: STOP Act (Not applicable) Fill 1 day early if closed on refill date. Avoid benzodiazepines within 8 hours of opioids   Medications administered: We administered lidocaine , midazolam  PF, ropivacaine  (PF) 2 mg/mL (0.2%), and dexamethasone .  See the medical record for exact dosing, route, and time of administration.    C4-6 MBNB 11/07/24    Follow-up plan:   Return in about 8 weeks (around 01/02/2025) for PPE, F2F, MM, F2F, Seema.     Recent Visits Date Type Provider Dept  10/13/24 Office Visit Patel, Seema K, NP [facility]  09/05/24 Office Visit Patel, Seema K, NP [facility]  08/17/24 Office Visit Patel, Seema K, NP [facility]  08/09/24 Office Visit Patel, Seema K, NP [facility]  Showing recent visits within past 90 days and meeting all other requirements Today's Visits Date Type Provider Dept  11/07/24 Procedure visit Marcelino Nurse, MD [facility]  Showing today's visits and meeting all other requirements Future Appointments Date Type Provider Dept  12/29/24  Appointment Patel, Seema K, NP [facility]  Showing future appointments within next 90 days and meeting all other requirements   Disposition: Discharge home  Discharge (Date  Time): 11/07/2024; 1010 hrs.   Primary Care Physician: [name], PA-C Location: [facility] Note by: Nurse Marcelino, MD (TTS technology used. I apologize for any typographical errors that were not detected and corrected.) Date: 11/07/2024;  Time: 10:39 AM  Disclaimer:  Medicine is not an visual merchandiser. The only guarantee in medicine is that nothing is guaranteed. It is important to note that the decision to proceed with this intervention was based on the information collected from the patient. The Data and conclusions were drawn from the patient's questionnaire, the interview, and the physical examination. Because the information was provided in large part by the patient, it cannot be guaranteed that it has not been purposely or unconsciously manipulated. Every effort has been made to obtain as much relevant data as possible for this evaluation. It is important to note that the conclusions that lead to this procedure are derived in large part from the available data. Always take into account that the treatment will also be dependent on availability of resources and existing treatment guidelines, considered by other Pain Management Practitioners as being common knowledge and practice, at the time of the intervention. For Medico-Legal purposes, it is also important to point out that variation in procedural techniques and pharmacological choices are the acceptable norm. The indications, contraindications, technique, and results of the above procedure should only be interpreted and judged by a Board-Certified Interventional Pain Specialist with extensive familiarity and expertise in the same exact procedure and technique.

## 2024-11-07 NOTE — Progress Notes (Signed)
 Safety precautions to be maintained throughout the outpatient stay will include: orient to surroundings, keep bed in low position, maintain call bell within reach at all times, provide assistance with transfer out of bed and ambulation.   Nursing Pain Medication Assessment:  Safety precautions to be maintained throughout the outpatient stay will include: orient to surroundings, keep bed in low position, maintain call bell within reach at all times, provide assistance with transfer out of bed and ambulation.  Medication Inspection Compliance: Pill count conducted under aseptic conditions, in front of the patient. Neither the pills nor the bottle was removed from the patient's sight at any time. Once count was completed pills were immediately returned to the patient in their original bottle.  Medication: Hydrocodone /APAP Pill/Patch Count: 90 of 90 pills/patches remain Pill/Patch Appearance: Markings consistent with prescribed medication Bottle Appearance: Standard pharmacy container. Clearly labeled. Filled Date: 99 / 46 / 2025 Last Medication intake:  Yesterday

## 2024-11-08 ENCOUNTER — Encounter: Admitting: Nurse Practitioner

## 2024-11-08 ENCOUNTER — Telehealth: Payer: Self-pay | Admitting: *Deleted

## 2024-11-08 NOTE — Telephone Encounter (Signed)
 No problems post procedure.

## 2024-11-09 ENCOUNTER — Ambulatory Visit

## 2024-11-10 DIAGNOSIS — F332 Major depressive disorder, recurrent severe without psychotic features: Secondary | ICD-10-CM | POA: Diagnosis not present

## 2024-11-10 NOTE — Progress Notes (Addendum)
 Telephone Visit- Progress Note: Referring Physician:  [name], PA-C No address on file  Primary Physician:  [name], [name], PA-C (Inactive)  This visit was performed via telephone.  Patient location: home Provider location: working from home  I spent a total of 10 minutes non-face-to-face activities for this visit on the date of this encounter including review of current clinical condition and response to treatment.    Patient has given verbal consent to this telephone visits and we reviewed the limitations of a telephone visit. Patient wishes to proceed.    Chief Complaint:  review imaging  History of Present Illness: Jonathon Snow is a 63 y.o. male has a history of  cerebral venous sinus thrombosis, COPD, hypercholesterolemia, hyperlipidemia, HTN, SLE, migraines, MI, history of DVT with IVC filter, lymphedema, obesity, osteoporosis, history of PE, OSA.   Last seen by me on 10/12/24 for neck pain with radiation into his shoulders. He has known cervical spondylosis with mild central and bilateral foraminal stenosis that is moderate at C6-C7 and mild at C4-C5. Also with mild/moderate central and bilateral foraminal stenosis C5-C6.   He had painful limited ROM of both shoulders at his last visit. He was to follow up with ortho.   EMG of upper extremities scheduled with Dr. Leigh on 12/28/23.   He had bilateral cervical MBB C4-C6 on 11/07/24.   Phone visit scheduled to review his cervical MRI.   He had some initial improvement with MBB, but now pain is the same. He has constant neck pain with radiation to right > left shoulder. He has intermittent bilateral arm pain sometimes to elbow and sometimes to hand, right > left. He has limited ROM of both shoulders.   He is scheduled to see rheumatology on 12/30/23.    He has been discharged from PT for neck/shoulders. He is in PT now for vertigo.    He is on ELIQUIS  and PLAVIX . He is on neurontin , norco (from [facility]).     Tobacco use: Does not smoke.    Bowel/Bladder Dysfunction: some urinary urgency. No bowel issues.    Conservative measures:  Physical therapy: discharged for neck and shoulder. Seeing PT for vertigo  Multimodal medical therapy including regular antiinflammatories: neurontin , norco  Injections:  Right C7-T1 IL ESI on 06/15/24 [name]   Past Surgery:  T7-T12 PSF on 11/06/23   Exam: No exam done as this was a telephone encounter.     Imaging: Cervical MRI dated 10/31/24:  FINDINGS: Alignment: Exaggeration of the normal cervical lordosis. No listhesis.   Vertebrae: Mild chronic compression deformities involving the superior endplates of T1 and T2 without retropulsion, stable. Vertebral body height otherwise maintained with no acute or interval fracture. Bone marrow signal intensity within normal limits. 1.7 cm benign hemangioma noted within the T3 vertebral body. No worrisome osseous lesions. No abnormal marrow edema.   Cord: Normal signal and morphology.   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 Craniocervical junction within normal limits. Paraspinous soft tissues within normal limits. Normal flow voids seen within the vertebral arteries bilaterally.   Disc levels:   C2-C3: Mild disc bulge. No spinal stenosis. Foramina remain patent. Appearance is stable.   C3-C4: Minimal disc bulge. Mild bilateral facet spurring. No significant spinal stenosis. Foramina remain patent. Appearance is stable.   C4-C5: Diffuse disc bulge, asymmetric to the right, with right worse than left uncovertebral spurring. Flattening of the ventral thecal sac with minimal  flattening of the right ventral cord, but no cord signal changes. Superimposed mild right-sided facet hypertrophy. Resultant mild spinal stenosis. Moderate right C5 foraminal narrowing. Left neural foramina remains patent.  Appearance is relatively stable.   C5-C6: Diffuse disc bulge with bilateral uncovertebral spurring. Posterior component flattens and indents the ventral thecal sac, slightly worse on the right (series 109, image 20). Secondary cord flattening without cord signal changes. Superimposed mild right-sided facet hypertrophy. Mild spinal stenosis. Moderate left C6 foraminal narrowing. Right neural foramina remains patent. Appearance is similar.   C6-C7: Degenerative intervertebral disc space narrowing with diffuse disc osteophyte complex, asymmetric to the left. Mild flattening of the ventral thecal sac without significant spinal stenosis. Mild left C7 foraminal narrowing. Right neural foramina remains patent.   C7-T1: No significant disc bulge or focal disc herniation. Minimal facet spurring. No spinal stenosis. Foramina remain patent. Appearance is stable.   IMPRESSION: 1. Overall, little interval change in appearance of the cervical spine as compared to 02/26/2024. 2. Multilevel cervical spondylosis with resultant mild spinal stenosis at C4-5 and C5-6. 3. Multifactorial degenerative changes with resultant multilevel foraminal narrowing as above. Notable findings include moderate right C5 and left C6 foraminal stenosis, with mild left C7 foraminal narrowing. 4. Mild chronic compression deformities involving the superior endplates of T1 and T2 without retropulsion, stable.     Electronically Signed   By: Morene Hoard M.D.   On: 11/01/2024 18:25    I have personally reviewed the images and agree with the above interpretation.  Assessment and Plan: Jonathon Snow is s/p T7-T12 PSF on 11/06/23.   He had some initial improvement with MBB, but now pain is the same. He has constant neck pain with radiation to right > left shoulder. He has intermittent bilateral arm pain sometimes to elbow and sometimes to hand, right > left. He has limited ROM of both shoulders.    He has known  cervical spondylosis with mild central stenosis C4-C6 and multilevel foraminal stenosis.    At last visit he had weakness in both right and left arm along with very limited/painful ROM of both shoulders.    Treatment options discussed with patient and following plan made:    - Follow up with pain management and scheduled.  - EMG is on 12/27/24.  - Follow up scheduled for him to see Dr. Clois to discuss further options after his EMG.   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 Reviewed with Dr. Clois. 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   Glade Boys PA-C Neurosurgery

## 2024-11-11 ENCOUNTER — Ambulatory Visit: Admitting: Orthopedic Surgery

## 2024-11-11 ENCOUNTER — Telehealth: Payer: Self-pay | Admitting: Orthopedic Surgery

## 2024-11-11 ENCOUNTER — Encounter: Payer: Self-pay | Admitting: Orthopedic Surgery

## 2024-11-11 DIAGNOSIS — M25511 Pain in right shoulder: Secondary | ICD-10-CM

## 2024-11-11 DIAGNOSIS — M4802 Spinal stenosis, cervical region: Secondary | ICD-10-CM

## 2024-11-11 DIAGNOSIS — M25512 Pain in left shoulder: Secondary | ICD-10-CM

## 2024-11-11 DIAGNOSIS — M5412 Radiculopathy, cervical region: Secondary | ICD-10-CM

## 2024-11-11 DIAGNOSIS — G8929 Other chronic pain: Secondary | ICD-10-CM | POA: Diagnosis not present

## 2024-11-11 DIAGNOSIS — M4722 Other spondylosis with radiculopathy, cervical region: Secondary | ICD-10-CM | POA: Diagnosis not present

## 2024-11-11 DIAGNOSIS — M47812 Spondylosis without myelopathy or radiculopathy, cervical region: Secondary | ICD-10-CM

## 2024-11-11 NOTE — Telephone Encounter (Signed)
 Called [facility] office and asked for them to add patient to the waiting list, this has been done.

## 2024-11-11 NOTE — Telephone Encounter (Signed)
 He has EMG scheduled with Dr. Leigh on 12/28/23. Please call them to be sure he is on cancellation list.   Thanks!

## 2024-11-14 ENCOUNTER — Ambulatory Visit

## 2024-11-14 ENCOUNTER — Ambulatory Visit: Admitting: Nurse Practitioner

## 2024-11-14 VITALS — BP 136/84 | HR 68 | Temp 97.6°F | Ht 74.0 in | Wt 216.0 lb

## 2024-11-14 DIAGNOSIS — M542 Cervicalgia: Secondary | ICD-10-CM

## 2024-11-14 DIAGNOSIS — F330 Major depressive disorder, recurrent, mild: Secondary | ICD-10-CM

## 2024-11-14 DIAGNOSIS — I2510 Atherosclerotic heart disease of native coronary artery without angina pectoris: Secondary | ICD-10-CM

## 2024-11-14 DIAGNOSIS — E785 Hyperlipidemia, unspecified: Secondary | ICD-10-CM

## 2024-11-14 DIAGNOSIS — I5032 Chronic diastolic (congestive) heart failure: Secondary | ICD-10-CM

## 2024-11-14 DIAGNOSIS — I951 Orthostatic hypotension: Secondary | ICD-10-CM | POA: Diagnosis not present

## 2024-11-14 DIAGNOSIS — I825Y2 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: Secondary | ICD-10-CM | POA: Diagnosis not present

## 2024-11-14 DIAGNOSIS — I872 Venous insufficiency (chronic) (peripheral): Secondary | ICD-10-CM

## 2024-11-14 DIAGNOSIS — J449 Chronic obstructive pulmonary disease, unspecified: Secondary | ICD-10-CM | POA: Diagnosis not present

## 2024-11-14 DIAGNOSIS — R42 Dizziness and giddiness: Secondary | ICD-10-CM | POA: Diagnosis not present

## 2024-11-14 DIAGNOSIS — H04122 Dry eye syndrome of left lacrimal gland: Secondary | ICD-10-CM

## 2024-11-14 DIAGNOSIS — R262 Difficulty in walking, not elsewhere classified: Secondary | ICD-10-CM

## 2024-11-14 DIAGNOSIS — M3214 Glomerular disease in systemic lupus erythematosus: Secondary | ICD-10-CM | POA: Diagnosis not present

## 2024-11-14 DIAGNOSIS — H1032 Unspecified acute conjunctivitis, left eye: Secondary | ICD-10-CM | POA: Diagnosis not present

## 2024-11-14 DIAGNOSIS — I10 Essential (primary) hypertension: Secondary | ICD-10-CM

## 2024-11-14 DIAGNOSIS — R2681 Unsteadiness on feet: Secondary | ICD-10-CM | POA: Diagnosis not present

## 2024-11-14 DIAGNOSIS — N1831 Chronic kidney disease, stage 3a: Secondary | ICD-10-CM | POA: Diagnosis not present

## 2024-11-14 DIAGNOSIS — F419 Anxiety disorder, unspecified: Secondary | ICD-10-CM

## 2024-11-14 DIAGNOSIS — E663 Overweight: Secondary | ICD-10-CM

## 2024-11-14 DIAGNOSIS — M5459 Other low back pain: Secondary | ICD-10-CM | POA: Diagnosis not present

## 2024-11-14 DIAGNOSIS — I890 Lymphedema, not elsewhere classified: Secondary | ICD-10-CM | POA: Diagnosis not present

## 2024-11-14 DIAGNOSIS — M329 Systemic lupus erythematosus, unspecified: Secondary | ICD-10-CM

## 2024-11-14 DIAGNOSIS — M5412 Radiculopathy, cervical region: Secondary | ICD-10-CM | POA: Diagnosis not present

## 2024-11-14 DIAGNOSIS — H6982 Other specified disorders of Eustachian tube, left ear: Secondary | ICD-10-CM | POA: Diagnosis not present

## 2024-11-14 DIAGNOSIS — G629 Polyneuropathy, unspecified: Secondary | ICD-10-CM

## 2024-11-14 DIAGNOSIS — M5416 Radiculopathy, lumbar region: Secondary | ICD-10-CM | POA: Diagnosis not present

## 2024-11-14 MED ORDER — ERYTHROMYCIN 5 MG/GM OP OINT: 1.0000 | TOPICAL_OINTMENT | Freq: Three times a day (TID) | OPHTHALMIC | 0 refills | Status: AC

## 2024-11-14 NOTE — Therapy (Addendum)
 OUTPATIENT PHYSICAL THERAPY TREATMENT  Patient Name: Jonathon Snow MRN: [id number] DOB:Jan 16, 1961, 63 y.o., male Today's Date: 11/14/2024  PCP: [name], PA-C (Inactive) REFERRING PROVIDER: [name], PA-C  END OF SESSION:  PT End of Session - 11/14/24 0806     Visit Number 17    Number of Visits 40    Date for Recertification  01/25/25    Authorization Type [place] [employer]    Authorization Time Period 09/07/24-12/06/24 for 18 visits    Authorization - Visit Number 17    Authorization - Number of Visits 18    Progress Note Due on Visit 20    PT Start Time 0801    PT Stop Time 0841    PT Time Calculation (min) 40 min    Equipment Utilized During Treatment Gait belt    Activity Tolerance Patient tolerated treatment well;No increased pain;Patient limited by pain    Behavior During Therapy [facility] for tasks assessed/performed           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   Overview:  superior sagittal sinus, left transverse sinus and cortical veins    Closed left hip fracture, initial encounter (HCC) 02/18/2023   COPD (chronic obstructive pulmonary disease) (HCC)    COVID-19 virus infection 12/2020   Depression    DVT (deep venous thrombosis) (HCC)    Dyspnea    GERD (gastroesophageal reflux disease)    Head injury 11/05/2023   Heart attack (HCC) 11/05/2023   Heel spur, left 02/16/2019   Heel spur, right 02/16/2019   Hematoma of groin 11/08/2023   Herpes zoster infection of lumbar region 02/20/2020   History of DVT (deep vein thrombosis) 03/22/2020   History of kidney stones    HTN (hypertension)    Hyperlipidemia    Hypertension    Long term current use of systemic steroids 11/08/2020   Lupus    Lymphedema  10/07/2018   Morbid obesity (HCC)    Opiate abuse, episodic (HCC) 02/26/2018   Osteoporosis    Pain and swelling of right lower extremity 02/03/2023   Pneumonia    PONV (postoperative nausea and vomiting)    Postphlebitic syndrome with ulcer, 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   Presence of IVC filter 03/22/2020   Removed   Pulmonary embolism (HCC)    Renal disorder    Stage III   Severe episode of recurrent major depressive disorder, 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   Shock circulatory (HCC) 11/06/2023   Sleep apnea    STEMI (ST elevation myocardial infarction) (HCC) 11/05/2023   Past Surgical History:  Procedure Laterality Date   ANKLE SURGERY Right    BACK SURGERY     BRONCHIAL WASHINGS N/A 11/01/2021   Procedure: BRONCHIAL WASHINGS;  Surgeon: Parris Manna, MD;  Location: [facility] ORS;  Service: Thoracic;  Laterality: N/A;   COLONOSCOPY WITH PROPOFOL  N/A 05/28/2020   Procedure: COLONOSCOPY WITH PROPOFOL ;  Surgeon: Therisa Bi, MD;  Location: [facility] ENDOSCOPY;  Service: Endoscopy;  Laterality: N/A;   COLONOSCOPY WITH PROPOFOL  N/A 07/01/2023   Procedure: COLONOSCOPY WITH PROPOFOL ;  Surgeon: Therisa Bi, MD;  Location: [facility] ENDOSCOPY;  Service: Gastroenterology;  Laterality: N/A;   CORONARY ULTRASOUND/IVUS N/A 11/05/2023   Procedure: Coronary Ultrasound/IVUS;  Surgeon: Mady Bruckner, MD;  Location:  [facility] INVASIVE CV LAB;  Service: Cardiovascular;  Laterality: N/A;   CORONARY/GRAFT ACUTE MI REVASCULARIZATION N/A 11/05/2023   Procedure: Coronary/Graft Acute MI Revascularization;  Surgeon: Mady Bruckner, MD;  Location: [facility] INVASIVE CV LAB;  Service: Cardiovascular;  Laterality: N/A;   CYST EXCISION  92 or 93    Liver cyst removal [facility]   FLEXIBLE BRONCHOSCOPY N/A 11/01/2021   Procedure: FLEXIBLE BRONCHOSCOPY;  Surgeon: Parris Manna, MD;  Location: [facility] ORS;  Service: Thoracic;  Laterality: N/A;   FRACTURE SURGERY     HIP PINNING,CANNULATED Left 02/19/2023   Procedure:  PERCUTANEOUS FIXATION OF FEMORAL NECK;  Surgeon: Tobie Priest, MD;  Location: [facility] ORS;  Service: Orthopedics;  Laterality: Left;   I & D EXTREMITY Right 04/29/2017   Procedure: IRRIGATION AND DEBRIDEMENT EXTREMITY;  Surgeon: Shelva Dunnings, MD;  Location: [facility] ORS;  Service: General;  Laterality: Right;   IRRIGATION AND DEBRIDEMENT ABSCESS Left 04/29/2017   Procedure: IRRIGATION AND DEBRIDEMENT Scrotal ABSCESS;  Surgeon: Shelva Dunnings, MD;  Location: [facility] ORS;  Service: General;  Laterality: Left;   LEFT HEART CATH AND CORONARY ANGIOGRAPHY N/A 11/05/2023   Procedure: LEFT HEART CATH AND CORONARY ANGIOGRAPHY;  Surgeon: Mady Bruckner, MD;  Location: [facility] INVASIVE CV LAB;  Service: Cardiovascular;  Laterality: N/A;   POLYPECTOMY  07/01/2023   Procedure: POLYPECTOMY;  Surgeon: Therisa Bi, MD;  Location: [facility] ENDOSCOPY;  Service: Gastroenterology;;   SPINE SURGERY     Patient Active Problem List   Diagnosis Date Noted   B12 deficiency 07/28/2024   Vitamin D  deficiency 07/28/2024   Cervical radicular pain 05/26/2024   History of thoracic spinal fusion (T7-T12) 05/26/2024   History of lumbar spinal fusion (L5-S1) 05/26/2024   Chronic pain syndrome 05/26/2024   Anxiety and depression 02/26/2024   Dyslipidemia 02/26/2024   Peripheral neuropathy 02/26/2024   Essential hypertension 02/26/2024   Vertigo 02/26/2024   Orthostatic hypotension 02/11/2024   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) 01/20/2024   Anxiety 12/14/2023   Overweight (BMI 25.0-29.9) 11/23/2023   Chronic heart failure with preserved ejection fraction (HFpEF) (HCC) 11/23/2023   BPH (benign prostatic hyperplasia) 11/23/2023   Unstable angina (HCC) 11/10/2023   Closed T10 spinal fracture (HCC) 11/06/2023   Closed tricolumnar fracture of thoracic vertebra (HCC) 11/06/2023   Thoracic spine instability 11/06/2023   Chronic bilateral low back pain with left-sided sciatica 07/07/2023   Neuropathy  07/07/2023   Hx of colonic polyps 07/01/2023   Adenomatous polyp of colon 07/01/2023   Erectile disorder 01/22/2023   Coronary artery disease involving native coronary artery of native heart without angina pectoris 09/09/2022   Varicose 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 01/03/2022   Immunocompromised state due to drug therapy 09/18/2021   Prediabetes 11/08/2020   Stage 3a chronic kidney disease (HCC) 11/08/2020   Seasonal allergies 11/08/2020   Rhinosinusitis 11/08/2020   Anticoagulant disorder 11/07/2020   Senile purpura 11/07/2020   MDD (major depressive disorder), recurrent episode, mild 09/12/2019   Other spondylosis with radiculopathy, lumbar region 02/16/2019   Osteoporosis 10/12/2018   Chronic venous insufficiency 10/07/2018   Lymphedema 10/07/2018   SLE glomerulonephritis syndrome, 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HCC) 03/03/2018   Syncope and collapse 12/29/2017   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) 10/29/2016   Hyperlipidemia LDL goal <70 01/15/2016   Obesity (BMI 30.0-34.9) 01/15/2016   Long term current use of anticoagulant 10/04/2015   Systemic lupus erythematosus (HCC) 05/30/2015   COPD, moderate (HCC) 06/27/2014  History of pulmonary embolism 12/11/2013   Nodule of right lung 12/11/2013   Cerebral venous sinus thrombosis 08/21/2013   Cystic disease of liver 08/21/2013   ONSET DATE: November 2024   REFERRING DIAG: imbalance  THERAPY DIAG:  Unsteadiness on feet  Cervicalgia  Other low back pain  Difficulty in walking, not elsewhere classified  Rationale for Evaluation and Treatment: Rehabilitation  SUBJECTIVE:                                                                                                                                                                                            SUBJECTIVE STATEMENT:  Pt still recovering from few days of feelign poor after steroids and injection in neck.  He is slowly progressing back to baseline. His recent strength and balance gains have maintained.   PERTINENT HISTORY:   62yoM referred to OPPT for imbalance. Pt just finished OPPT for post surgical neck pain on 08/30/24 at [facility] so he could begin his balance therapy here. Pt has had issues with chronic orthostatic hypotension repeated syncope, fluctuating intolerance to upright. Pt has become quite good at managing his safety at home with symptoms fluctuations. PT checks BP daily, has PRN meds for excessively low or high pressures.   PAIN:  Are you having pain? 6/10  neck/shoulders; 6/10 low back   PRECAUTIONS: Fall WEIGHT BEARING RESTRICTIONS: No FALLS: Has patient fallen in last 6 months? Yes 1x in March 2025  LIVING ENVIRONMENT: Lives with: Alone  Lives in: 1 level  Stairs: threshold only Has following equipment at home: RW, canes, walking sticks.   PLOF: 2 years ago was able to go hiking for pleasure ab lib; used to get in and out kayak and do multi day treks overnight with kayak.   PATIENT GOALS: improve tolerance to upright mobility, stop falling as much   OBJECTIVE:  Note: Objective measures were completed at evaluation unless otherwise noted.   FUNCTIONAL TESTS:  TUG: 34sec (armless chair)  5xSTS: 54.9sec with BUE heavy use arm chair  : 14.63 Normal stance eyes closed: 9/22: 5-10sec for 3 bouts  Narrow stance eyes closed: 9/22: 17-18 sec for 3 bouts   PATIENT SURVEYS:  ABC Scale: 47.5%  TREATMENT DATE 11/14/24:   -overground AMB with up walker, 493ft, 2lb AW: 72m34sec (98% SpO2, 68bpm)  -wide tandem stance 2x30sec bilat  -alternate 180 degree turns, hands free x12 (2lb AW bilat)  -alterate wide left step/right step x16 2lb AW   -overground AMB with up walker, 474ft, 2lb AW: 27m09sec (93% SpO2, 78bpm)  -forward step up: floor to foam,  foam to step, step to floor, turn and repeat x8 (support bar as needed)  -Hoist Leg Press seat 12, level 4 3x10 (decreased from level 5 today)      PATIENT EDUCATION: Education details: activity modification when neck is flared up  Person educated: Programmer, Applications: Discussion, handout Education comprehension: Fair   HOME EXERCISE PROGRAM: Access Code: [email or web address] URL: [email or web address] Date: 09/12/2024 Prepared by: Massie Dollar  Exercises - sit to stand, arms crossed, 28-30 inch surface   - 3 x daily - 1 sets - 8 reps - Standing Near Stance in Corner with Eyes Closed  - 3 x daily - 1 sets - 5 reps - 30 hold - Semi-Tandem Balance at Counter Top Eyes Open  - 1 x daily - 7 x weekly - 3 sets - 4 reps - 15 hold - Standing March with Counter Support  - 1 x daily - 7 x weekly - 3 sets - 10 reps - Seated Long Arc Quad  - 1 x daily - 7 x weekly - 3 sets - 10 reps - Backward Walking with Counter Support  - 1 x daily - 7 x weekly - 3 sets - 5 reps  GOALS: Goals reviewed with patient? No  SHORT TERM GOALS: Target date: 10/07/24  Pt to report no falls in most recent 4 weeks to indicate good safety practices and symptoms management in the home.  Baseline: reports orthostatic s/s roughly 2x per week.  10/12/24: pt denies any falls in the last 4 weeks Goal status: MET  2.  Pt will demonstrate improvement in 5xSTS performance by >3 seconds.  Baseline: 54.9sec with BUE heavy use arm chair  10/12/24: 43.01 sec Goal status: MET  3.  Pt to demonstrate improvement in by 20% and backwards by 25%  Baseline: 9/22: forward: 14.63 sec (0.6m/s). Backward: 49.04 sec 0.15m/s  10/12/24: 15.21 sec (0.66 m/s); 45.77 sec (0.22 m/s) Goal status: ONGOING   LONG TERM GOALS: Target date: 01/25/25  Pt to demonstrate tolerance of >1049ft sustained AMB c LRAD without presyncope, without LOB, and without increase in back pain >2/10 points NPRS.  Baseline: tolerated 3106ft with  mild-moderate increase in L hip and low back pain;  09/26/24: 473ft with Upwalker, 67m53s (no increased pain in back/hip)  10/12/24: 793' with Upwalker, 6 min (+2/10 pain increase in the lumbar region) 11/02/2024= 780 feet with Upright walker- (6/10 LBP pain before, during, and after today)  Goal status: PROGRESSING  2.  5xSTS performance, standard chair, hands free <20sec; hands free from 19.5 surface height in <12sec.   Baseline:  54.9sec with BUE heavy use arm chair  10/12/24: 43.01 sec 11/02/2024: 37.46 sec with BUE heavy use arm Goal status: PROGRESING  3.  Eye closed in narrow stance x60sec (5x without LOB and without need for hand support).  Baseline: 4-10 sec  10/12/24: 42 sec 11/02/2024: 60 sec x 1 time Goal status: PROGRESSING  4. Pt will improve BERG by at least 3 points in order to demonstrate clinically significant improvement in balance.    Baseline: 11/02/2024= 32/56  Goal status: New  ASSESSMENT:  CLINICAL IMPRESSION: Pt returns after several days off due to reaction to neck injection/medication. Contnued focus on balance, overground activity tolerance, and BLE strength. Went to lengths to prevent exacerbation of pain during session. Pt is motivated and his condition has the potential to improve in response to therapy. Maximum improvement is yet to be obtained. The anticipated improvement is attainable and reasonable in a generally predictable time.  Pt will continue to benefit from skilled therapy to address remaining deficits in order to improve overall QoL and return to PLOF.    OBJECTIVE IMPAIRMENTS: Decreased knowledge of condition, decreased use of DME, decreased mobility, difficulty walking, decreased strength, decreased ROM. ACTIVITY LIMITATIONS: Lifting, standing, walking, squatting, transfers, locomotion level PARTICIPATION LIMITATIONS: Cleaning, laundry, interpersonal relationships, driving, yardwork, community activity.  PERSONAL FACTORS: Age, behavior  pattern, education, past/current experiences, transportation, profession  are also affecting patient's functional outcome.  REHAB POTENTIAL: Good CLINICAL DECISION MAKING: Medium  EVALUATION COMPLEXITY: Moderate    PLAN: PT FREQUENCY: 1-2x/week PT DURATION: 12 weeks PLANNED INTERVENTIONS: 97110-Therapeutic exercises, 97530- Therapeutic activity, 97112- Neuromuscular re-education, 97535- Self Care, 02859- Manual therapy, [phone]- Gait training, [phone]- Electrical stimulation (unattended), [phone]- Electrical stimulation (manual), Patient/Family education, Balance training, Stair training, Cryotherapy, and Moist heat  PLAN FOR NEXT SESSION:  -continued leg press,  -overground balance training with walking sparingly due to easily provoked back issues -continue overground AMB of 4-5 laps with upwalker, start adding 2-3lb AW   8:13 AM, 11/14/24 Peggye JAYSON Linear, PT, DPT Physical Therapist - [facility]  Outpatient Physical Therapy- Main Campus [phone]     *addendum on 11/21/24 to correct date error in intervention section.   7:38 AM, 11/21/24 Peggye JAYSON Linear, PT, DPT Physical Therapist - [facility]  Outpatient Physical Therapy- Main Campus [phone]

## 2024-11-14 NOTE — Progress Notes (Signed)
 BP (!) 146/94   Pulse 68   Temp 97.6 F (36.4 C)   Ht 6' 2 (1.88 m)   Wt 216 lb (98 kg)   SpO2 99%   BMI 27.73 kg/m    Subjective:    Patient ID: Jonathon Snow, male    DOB: 03-Jan-1961, 63 y.o.   MRN: [id number]  HPI: Jonathon Snow is a 63 y.o. male  Chief Complaint  Patient presents with   Medical Management of Chronic Issues    Pt c/o chronic left eye dryness. States over the counter eye drops has not helped.    Discussed the use of AI scribe software for clinical note transcription with the patient, who gave verbal consent to proceed.  History of Present Illness Jonathon Snow is a 63 year old male with hypertension and lupus who presents for a three-month follow-up and complaints of left eye dryness.  Ocular symptoms - Persistent dryness in the left eye with burning sensation under the eye - Redness and burning have continued despite completion of Cipro  eye drops on October 6th - Over-the-counter eye drops have not provided relief - No blurriness or clunkiness recently, though clunkiness occurred in the past  HTN/heart failure/chronic embolism and thrombosis of DVT/CAD/HLD - Monitors blood pressure at home with noted fluctuations, including occasional very low or very high readings - Consistently takes antihypertensive medications without missed doses -currently taking hydralazine  25 mg three times daily, losartan  25 mg daily, metoprolol  25 mg daily, midodrine  10 mg three times a day, plavix  75 mg daily, eliquis  5 mg BID, rosuvastatin  20 mg daily -managed by cardiology  COPD - breathing has been stable -managed with albuterol   Renal function - Recent blood work showed GFR of 67 - Kidney function has improved since August, previously GFR was 70 -managed by nephrology  Mood and psychiatric status - Mood is stable  -currently taking Buspar  5 mg two times a day, Zoloft  150 mg daily, klonopin  0.5 mg as needed, hydroxyzine  25-50 mg at bedtime, abilify  4 mg  daily -managed by psychiatry  Chronic venous insufficiency/lymphedema /neuropathy/chronic back pain -currently taking gabapentin  300 mg BID -followed by vascular and pain management  Lupus/SLE glomerulonephritis  -currently taking plaquenil 200 mg BID -managed by rheumatology and nephrology  Osteoporosis/hyperparathyroidism -managed by endocrinology -currently on fosamax , vitamin d  and calcium .  They tried to get him on prolia and reclast but was declined by insurance  GERD -managed by gastroenterology -currently taking pantoprazole  20 mg daily and pepcid  20 mg BID    Specialist care - Continues to follow with neurosurgery, pain management, cardiology, nephrology, and vascular specialists         10/19/2024    1:23 PM 10/13/2024    8:24 AM 09/26/2024    9:12 AM  Depression screen PHQ 2/9  Decreased Interest 1 1 1   Down, Depressed, Hopeless 1 1 2   PHQ - 2 Score 2 2 3   Altered sleeping 1  3  Tired, decreased energy 3  3  Change in appetite 1  2  Feeling bad or failure about yourself  1  2  Trouble concentrating 0  0  Moving slowly or fidgety/restless 0  0  Suicidal thoughts 0  0  PHQ-9 Score 8   13   Difficult doing work/chores Extremely dIfficult  Extremely dIfficult     Data saved with a previous flowsheet row definition    Relevant past medical, surgical, family and social history reviewed and updated as indicated. Interim  medical history since our last visit reviewed. Allergies and medications reviewed and updated.  Review of Systems  Ten systems reviewed and is negative except as mentioned in HPI      Objective:      BP (!) 146/94   Pulse 68   Temp 97.6 F (36.4 C)   Ht 6' 2 (1.88 m)   Wt 216 lb (98 kg)   SpO2 99%   BMI 27.73 kg/m    Wt Readings from Last 3 Encounters:  11/14/24 216 lb (98 kg)  11/07/24 221 lb (100.2 kg)  10/19/24 219 lb (99.3 kg)    Physical Exam VITALS: BP- 146/94 GENERAL: Alert, cooperative, well developed, no acute  distress HEENT: Normocephalic, normal oropharynx, moist mucous membranes, left eye conjunctival injection CHEST: Clear to auscultation bilaterally, no wheezes, rhonchi, or crackles CARDIOVASCULAR: Normal heart rate and rhythm, S1 and S2 normal without murmurs ABDOMEN: Soft, non-tender, non-distended, without organomegaly, normal bowel sounds EXTREMITIES: No cyanosis or edema NEUROLOGICAL: Cranial nerves grossly intact, moves all extremities without gross motor or sensory deficit  Results for orders placed or performed in visit on 10/20/24  POC Hemoccult Bld/Stl (3-Cd Home Screen)   Collection Time: 10/20/24  8:16 AM  Result Value Ref Range   Card #1 Date 10/17/24    Fecal Occult Blood, POC Negative Negative   Card #2 Date 10/18/2024    Card #2 Fecal Occult Blod, POC Negative    Card #3 Date 10/19/24    Card #3 Fecal Occult Blood, POC Negative    *Note: Due to a large number of results and/or encounters for the requested time period, some results have not been displayed. A complete set of results can be found in Results Review.          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)   Chronic venous insufficiency   Coronary artery disease involving native coronary artery of native heart without angina pectoris   Chronic heart failure with preserved ejection fraction (HFpEF) (HCC)   Essential hypertension - Primary     Respiratory   COPD, moderate (HCC)     Nervous and Auditory   Neuropathy     Genitourinary   Stage 3a chronic kidney disease (HCC)     Other   Systemic lupus erythematosus (HCC)   Hyperlipidemia LDL goal <70   Obesity (BMI 30.0-34.9)   MDD (major depressive disorder), recurrent episode, mild   Anxiety   Other Visit Diagnoses       Chronic dryness of left eye         Acute bacterial conjunctivitis of  left eye       Relevant Medications   erythromycin  ophthalmic ointment        Assessment and Plan Assessment & Plan Adult Wellness Visit Routine follow-up visit with no new concerns. Blood pressure slightly elevated at 146/94 mmHg. Home blood pressure readings fluctuate. Medications are taken consistently. Recent blood work shows improved kidney function with GFR of 67. A1c was 5.7 in August. Lupus is well-managed despite low energy levels. - Continue current medications and monitoring of blood pressure at home.  Dry eye syndrome and acute conjunctivitis, left eye Persistent dryness and burning sensation in the left eye with redness. Previous treatment with Cipro  drops was ineffective. No significant discharge or blurriness currently. Possible incomplete resolution of infection. -  Prescribed ointment for dry eye syndrome and conjunctivitis. - Advised to report back if no improvement. - Will consider referral to an eye doctor if symptoms persist.  Essential hypertension Blood pressure is elevated at 146/94 mmHg. Home readings fluctuate between low and high. Medications are taken consistently. - Continue current antihypertensive regimen. - Monitor blood pressure at home and report significant changes. - Consistently takes antihypertensive medications without missed doses -currently taking hydralazine  25 mg three times daily, losartan  25 mg daily, metoprolol  25 mg daily, midodrine  10 mg three times a day, plavix  75 mg daily, eliquis  5 mg BID -managed by cardiology  Chronic kidney disease, stage 3a Recent improvement in kidney function with GFR of 67. Previously GFR was 49. - Continue monitoring kidney function with nephrology follow-up.  Systemic lupus erythematosus with glomerulonephritis Lupus is well-managed with no new symptoms. Energy levels are low but overall condition is stable.-currently taking plaquenil 200 mg BID -managed by rheumatology and nephrology  Chronic embolism and  thrombosis of left proximal lower extremity deep veins Continues on Eliquis  for anticoagulation. - Continue Eliquis  as prescribed.  Chronic obstructive pulmonary disease Continues to use albuterol  inhaler as needed. - Continue albuterol  inhaler as needed.  Atherosclerotic heart disease of native coronary artery -currently taking rosuvastatin  20 mg daily  Chronic diastolic heart failure -currently taking hydralazine  25 mg three times daily, losartan  25 mg daily, metoprolol  25 mg daily, midodrine  10 mg three times a day, plavix  75 mg daily, eliquis  5 mg BID, rosuvastatin  20 mg daily  Polyneuropathy Continues on gabapentin  for neuropathy management. - Continue gabapentin  as prescribed.  Overweight  Wt Readings from Last 3 Encounters:  11/14/24 216 lb (98 kg)  11/07/24 221 lb (100.2 kg)  10/19/24 219 lb (99.3 kg)    Flowsheet Row Office Visit from 11/14/2024 in [facility] Office Visit from 09/19/2024 in [facility]  1 40 inches 40 inches   Body mass index is 27.73 kg/m.  - Encourage continuation of lifestyle modifications, including dietary management and regular exercise. -continue to increase physical activity, getting at least 150 min of physical activity a week.  Work on including runner, broadcasting/film/video 2 days a week.  - continue eating at a calorie deficit 2000-2200cal a day, eating a well balanced diet with whole foods, avoiding processed foods.   Patient is motivated to continue working on lifestyle modification.    Depression and anxiety disorder Mood is stable with no changes or concerns. -currently taking Buspar  5 mg two times a day, Zoloft  150 mg daily, klonopin  0.5 mg as needed, hydroxyzine  25-50 mg at bedtime, abilify  4 mg daily -managed by psychiatry  Hyperlipidemia Continues on rosuvastatin  for lipid management. - Continue rosuvastatin  as prescribed.  Chronic venous insufficiency Continues to be followed by  vascular specialist for lymphedema management.        Follow up plan: No follow-ups on file.

## 2024-11-15 ENCOUNTER — Other Ambulatory Visit: Payer: Self-pay

## 2024-11-15 ENCOUNTER — Encounter: Payer: Self-pay | Admitting: Nurse Practitioner

## 2024-11-15 ENCOUNTER — Ambulatory Visit: Admitting: Occupational Therapy

## 2024-11-15 DIAGNOSIS — M5416 Radiculopathy, lumbar region: Secondary | ICD-10-CM | POA: Diagnosis not present

## 2024-11-15 DIAGNOSIS — R42 Dizziness and giddiness: Secondary | ICD-10-CM | POA: Diagnosis not present

## 2024-11-15 DIAGNOSIS — H04122 Dry eye syndrome of left lacrimal gland: Secondary | ICD-10-CM

## 2024-11-15 DIAGNOSIS — M5412 Radiculopathy, cervical region: Secondary | ICD-10-CM | POA: Diagnosis not present

## 2024-11-15 DIAGNOSIS — R2681 Unsteadiness on feet: Secondary | ICD-10-CM | POA: Diagnosis not present

## 2024-11-15 DIAGNOSIS — R262 Difficulty in walking, not elsewhere classified: Secondary | ICD-10-CM | POA: Diagnosis not present

## 2024-11-15 DIAGNOSIS — H10023 Other mucopurulent conjunctivitis, bilateral: Secondary | ICD-10-CM

## 2024-11-15 DIAGNOSIS — M542 Cervicalgia: Secondary | ICD-10-CM | POA: Diagnosis not present

## 2024-11-15 DIAGNOSIS — M5459 Other low back pain: Secondary | ICD-10-CM | POA: Diagnosis not present

## 2024-11-15 DIAGNOSIS — I890 Lymphedema, not elsewhere classified: Secondary | ICD-10-CM | POA: Diagnosis not present

## 2024-11-15 DIAGNOSIS — I951 Orthostatic hypotension: Secondary | ICD-10-CM | POA: Diagnosis not present

## 2024-11-15 NOTE — Patient Instructions (Signed)
 Visit Information  Jonathon Snow was given information about Medicaid Managed Care team care coordination services as a part of their Healthy [facility] Medicaid benefit. Jonathon Snow   If you would like to schedule transportation through your Healthy [facility] plan, please call the following number at least 2 days in advance of your appointment: [phone]  For information about your ride after you set it up, call Ride Assist at [phone]. Use this number to activate a Will Call pickup, or if your transportation is late for a scheduled pickup. Use this number, too, if you need to make a change or cancel a previously scheduled reservation.  If you need transportation services right away, call [phone]. The after-hours call center is staffed 24 hours to handle ride assistance and urgent reservation requests (including discharges) 365 days a year. Urgent trips include sick visits, hospital discharge requests and life-sustaining treatment.  Call the [facility] Line at [phone], at any time, 24 hours a day, 7 days a week. If you are in danger or need immediate medical attention call 911.   Please see education materials related to CHF/ COPD  provided by MyChart link.  Care plan and visit instructions communicated with the patient verbally today. Patient agrees to receive a copy in MyChart. Active MyChart status and patient understanding of how to access instructions and care plan via MyChart confirmed with patient.     Telephone follow up appointment with Managed Medicaid care management team member scheduled for: 12-19-2024 at 2:00 PM   [name] RN, BSN  [facility] [facility] RN Case Manager   Direct [phone]   Following is a copy of your plan of care:  There are no care plans that you recently modified to display for this patient.

## 2024-11-15 NOTE — Therapy (Signed)
 OUTPATIENT OCCUPATIONAL THERAPY TREATMENT NOTE  LEFT LOWER EXTREMITY/LEFT LOWER QUADRANT LYMPHEDEMA   Patient Name: Jonathon Snow MRN: [id number] DOB:1961-01-24, 63 y.o., male Today's Date: 11/15/2024  END OF SESSION:  OT End of Session - 11/15/24 1200     Visit Number 2    Number of Visits 6    Date for Recertification  01/23/25    OT Start Time 1110    OT Stop Time 1150    OT Time Calculation (min) 40 min    Activity Tolerance Patient tolerated treatment well;No increased pain    Behavior During Therapy [facility] for tasks assessed/performed            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   Overview:  superior sagittal sinus, left transverse sinus and cortical veins    Closed left hip fracture, initial encounter (HCC) 02/18/2023   COPD (chronic obstructive pulmonary disease) (HCC)    COVID-19 virus infection 12/2020   Depression    DVT (deep venous thrombosis) (HCC)    Dyspnea    GERD (gastroesophageal reflux disease)    Head injury 11/05/2023   Heart attack (HCC) 11/05/2023   Heel spur, left 02/16/2019   Heel spur, right 02/16/2019   Hematoma of groin 11/08/2023   Herpes zoster infection of lumbar region 02/20/2020   History of DVT (deep vein thrombosis) 03/22/2020   History of kidney stones    HTN (hypertension)    Hyperlipidemia    Hypertension    Long term current use of systemic steroids 11/08/2020   Lupus    Lymphedema 10/07/2018   Morbid obesity (HCC)    Opiate abuse, episodic (HCC) 02/26/2018   Osteoporosis    Pain and swelling of right lower extremity 02/03/2023   Pneumonia    PONV (postoperative nausea and vomiting)    Postphlebitic syndrome with ulcer, 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   Presence of IVC filter  03/22/2020   Removed   Pulmonary embolism (HCC)    Renal disorder    Stage III   Severe episode of recurrent major depressive disorder, 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   Shock circulatory (HCC) 11/06/2023   Sleep apnea    STEMI (ST elevation myocardial infarction) (HCC) 11/05/2023   Past Surgical History:  Procedure Laterality Date   ANKLE SURGERY Right    BACK SURGERY     BRONCHIAL WASHINGS N/A 11/01/2021   Procedure: BRONCHIAL WASHINGS;  Surgeon: Parris Manna, MD;  Location: [facility] ORS;  Service: Thoracic;  Laterality: N/A;   COLONOSCOPY WITH PROPOFOL  N/A 05/28/2020   Procedure: COLONOSCOPY WITH PROPOFOL ;  Surgeon: Therisa Bi, MD;  Location: [facility] ENDOSCOPY;  Service: Endoscopy;  Laterality: N/A;   COLONOSCOPY WITH PROPOFOL  N/A 07/01/2023   Procedure: COLONOSCOPY WITH PROPOFOL ;  Surgeon: Therisa Bi, MD;  Location: [facility] ENDOSCOPY;  Service: Gastroenterology;  Laterality: N/A;   CORONARY ULTRASOUND/IVUS N/A 11/05/2023   Procedure: Coronary Ultrasound/IVUS;  Surgeon: Mady Bruckner, MD;  Location: [facility] INVASIVE CV LAB;  Service: Cardiovascular;  Laterality: N/A;   CORONARY/GRAFT ACUTE MI REVASCULARIZATION N/A 11/05/2023   Procedure: Coronary/Graft Acute MI Revascularization;  Surgeon: Mady Bruckner, MD;  Location: [facility] INVASIVE CV LAB;  Service: Cardiovascular;  Laterality: N/A;   CYST EXCISION  92 or 93    Liver cyst removal  [facility]   FLEXIBLE BRONCHOSCOPY N/A 11/01/2021   Procedure: FLEXIBLE BRONCHOSCOPY;  Surgeon: Parris Manna, MD;  Location: [facility] ORS;  Service: Thoracic;  Laterality: N/A;   FRACTURE SURGERY     HIP PINNING,CANNULATED Left 02/19/2023   Procedure: PERCUTANEOUS FIXATION OF FEMORAL NECK;  Surgeon: Tobie Priest, MD;  Location: [facility] ORS;  Service: Orthopedics;  Laterality: Left;   I & D EXTREMITY Right 04/29/2017   Procedure: IRRIGATION AND DEBRIDEMENT EXTREMITY;  Surgeon: Shelva Dunnings, MD;  Location: [facility] ORS;  Service: General;  Laterality: Right;    IRRIGATION AND DEBRIDEMENT ABSCESS Left 04/29/2017   Procedure: IRRIGATION AND DEBRIDEMENT Scrotal ABSCESS;  Surgeon: Shelva Dunnings, MD;  Location: [facility] ORS;  Service: General;  Laterality: Left;   LEFT HEART CATH AND CORONARY ANGIOGRAPHY N/A 11/05/2023   Procedure: LEFT HEART CATH AND CORONARY ANGIOGRAPHY;  Surgeon: Mady Bruckner, MD;  Location: [facility] INVASIVE CV LAB;  Service: Cardiovascular;  Laterality: N/A;   POLYPECTOMY  07/01/2023   Procedure: POLYPECTOMY;  Surgeon: Therisa Bi, MD;  Location: [facility] ENDOSCOPY;  Service: Gastroenterology;;   SPINE SURGERY     Patient Active Problem List   Diagnosis Date Noted   B12 deficiency 07/28/2024   Vitamin D  deficiency 07/28/2024   Cervical radicular pain 05/26/2024   History of thoracic spinal fusion (T7-T12) 05/26/2024   History of lumbar spinal fusion (L5-S1) 05/26/2024   Chronic pain syndrome 05/26/2024   Anxiety and depression 02/26/2024   Dyslipidemia 02/26/2024   Peripheral neuropathy 02/26/2024   Essential hypertension 02/26/2024   Vertigo 02/26/2024   Orthostatic hypotension 02/11/2024   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) 01/20/2024   Anxiety 12/14/2023   Overweight (BMI 25.0-29.9) 11/23/2023   Chronic heart failure with preserved ejection fraction (HFpEF) (HCC) 11/23/2023   BPH (benign prostatic hyperplasia) 11/23/2023   Unstable angina (HCC) 11/10/2023   Closed T10 spinal fracture (HCC) 11/06/2023   Closed tricolumnar fracture of thoracic vertebra (HCC) 11/06/2023   Thoracic spine instability 11/06/2023   Chronic bilateral low back pain with left-sided sciatica 07/07/2023   Neuropathy 07/07/2023   Hx of colonic polyps 07/01/2023   Adenomatous polyp of colon 07/01/2023   Erectile disorder 01/22/2023   Coronary artery disease involving native coronary artery of native heart without angina pectoris 09/09/2022   Varicose 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 01/03/2022    Immunocompromised state due to drug therapy 09/18/2021   Prediabetes 11/08/2020   Stage 3a chronic kidney disease (HCC) 11/08/2020   Seasonal allergies 11/08/2020   Rhinosinusitis 11/08/2020   Anticoagulant disorder 11/07/2020   Senile purpura 11/07/2020   MDD (major depressive disorder), recurrent episode, mild 09/12/2019   Other spondylosis with radiculopathy, lumbar region 02/16/2019   Osteoporosis 10/12/2018   Chronic venous insufficiency 10/07/2018   Lymphedema 10/07/2018   SLE glomerulonephritis syndrome, 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HCC) 03/03/2018   Syncope and collapse 12/29/2017   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) 10/29/2016   Hyperlipidemia LDL goal <70 01/15/2016   Obesity (BMI 30.0-34.9) 01/15/2016   Long term current use of anticoagulant 10/04/2015   Systemic lupus erythematosus (HCC) 05/30/2015   COPD, moderate (HCC) 06/27/2014   History of pulmonary embolism 12/11/2013   Nodule of right lung 12/11/2013   Cerebral venous sinus thrombosis 08/21/2013   Cystic disease of liver 08/21/2013    PCP: Michelene Cower, PA-C  REFERRING PROVIDER: Selinda Gu , MD  REFERRING DIAG: I89.0  THERAPY DIAG:  Lymphedema, not elsewhere classified  Rationale  for Evaluation and Treatment: Rehabilitation  ONSET DATE: 2013 s/p DVT  SUBJECTIVE:                                                                                                                                                                            SUBJECTIVE STATEMENT: Jonathon Snow returns to OT today to complete anatomical measurements for custom compression garment replacements. Pt reports 6/10 LE-related L leg pain this morning. Pt has no new complaints.    (10/25/24 Initial OT Eval: Jonathon Snow is referred to Occupational Therapy by Selinda Gu, MD, for evaluation and treatment of LLE/LLQ secondary lymphedema. This Pt is well known to this OT as he successfully completed Intensive  and Self-Management Phases of Complete Decongestive Therapy from 07/07/23 to 10/08/23 for a total of 17 visits. Pt achieved a LLE modest limb volume reduction and was fitted with a custom, Jobst Elvarex,  flat knit ccl 2 ( 23-32 mmHg), thigh length compression garment. This custom compression garment is now worn out and in need of replacement. Pt reports he wears garment daily and it does a good job of controlling his leg swelling. Pt states, It's gotten looser too and it's not as hard to put on.   Pt endorses ~ 20 lb weight loss since obtaining this garment, which also affects fit. Other medical changes since last seen in 2024 include a head injury and a heart attack. Since last seen Jonathon Snow had a fall incurring a head injury, and when he awoke in the hospital he was  told he also had an MI. He tell me he is currently working on improving his balance and strength in PT. His goal for OT is to replace worn out compression garment and devices, including Jobst RELAX HOS device and compression bike shorts, so he can continue to manage lymphedema at home. He will bring these to next appointment when we will  review specifications and complete new measurements.)                                                                    PERTINENT HISTORY: Takes AMLODIPINE , PREDNISONE , GABAPENTIN ; Lupus, HTN, CVI, Varicose veins, CKD stage 3, Hx DVT, Hx PE, Hx leg wounds, CAD, COPD, L hip Fx 2024, 2019 IVC filter removed, hx post phlebitic syndrome with ulcer, R ankle Fx, back sx, hx scrotal abscess, major depressive disorder; 2024 head injury 2/2 fall concurrent w MI  PAIN:  Are you having pain? Yes: NPRS  scale: 6/10 Pain location: upper back, neck, shoulders,lower back L leg and hip Pain description: constant , aching, stabbing Aggravating factors: movement, weight bearing Relieving factors: laying down, pain meds  PRECAUTIONS: Falls, LYMPHEDEMA PRECAUTIONS: CARDIAC, PULMONARY, Hx ULCER/S; Hx DVT, HX PE   WEIGHT  BEARING RESTRICTIONS: No  FALLS:  Has patient fallen in last 6 months? Yes. Number of falls    LIVING ENVIRONMENT: Lives with: lives alone Lives in: House/apartment Stairs: No;  Has following equipment at home: Vannie - 2 wheeled and shower chair  OCCUPATION: unemployed; reports he was recently approved for SS disability  LEISURE: outdoors person, fishing, camping, hiking (currently unable 2/2 mobility impairments and chronic pain)  HAND DOMINANCE: right   PRIOR LEVEL OF FUNCTION: Independent  PATIENT GOALS: Replace worn out compression garments and devices so I can keep this lymphedema under control.  OBJECTIVE: Note: Objective measures were completed at Evaluation unless otherwise noted.  COGNITION:  Overall cognitive status: Within functional limits for tasks assessed   OBSERVATIONS / OTHER ASSESSMENTS:   POSTURE: shoulders protracted mildly, head forward and posterior pelvic tilt in dependent sitting  LE ROM: WFL for tasks assessed  LE MMT: WFL for tasks assessed  LYMPHEDEMA ASSESSMENTS:   SURGERY TYPE/DATE: N/A- non-cancer related  INFECTIONS: denies cellulitis  WOUNDS: hx venous ulcer L medial lateral ankle   LYMPHEDEMA ASSESSMENTS: Mild, Stage  II, Bilateral Lower Extremity Lymphedema 2/2 CVI    Skin  Description Hyper- Keratosis Peau 'd Orange Shiny Tight Fibrotic/ Indurated Fatty Doughy Spongy/ boggy   x   X Limited skin excursion Moderate w Lipodermatosclerosis      Skin dry Flaky WNL Macerated   mildly x     Color Redness Varicosities Blanching Hemosiderin Stain Mottled    x  x  x   Odor Malodorous Yeast Fungal infection  WNL      x   Temperature Warm Cool wnl    x     Pitting Edema   1+ 2+ 3+ 4+ Non-pitting         x   Girth Symmetrical Asymmetrical                   Distribution    L>R toes to groin    Stemmer Sign Positive Negative   +    Lymphorrhea History Of:  Present Absent     x    Wounds History Of Present  Absent Venous Arterial Pressure   x  x      Signs of Infection Redness Warmth Acute Swelling Drainage Borders                  Sensation Light Touch Deep pressure Hypersensitivity   In tact Impaired In tact Impaired Absent Impaired   x  x  x     Nails WNL   Fungus nail dystrophy   x     Hair Growth Symmetrical Asymmetrical   x    Skin Creases Base of toes  Ankles   Base of Fingers knees       Abdominal pannus Thigh Lobules  Face/neck   x         BLE COMPARATIVE LIMB VOLUMETRICS TBA OT Rx 1  LANDMARK RIGHT    R LEG (A-D) N/A  R THIGH (E-G) ml  R FULL LIMB (A-G) ml  Limb Volume differential (LVD)  %  Volume change since initial %  Volume change overall V  (Blank rows = not tested)  LANDMARK LEFT  L LEG (A-D) N/A  L THIGH (E-G) ml  L FULL LIMB (A-G) ml  Limb Volume differential (LVD)  %  Volume change since initial %  Volume change overall %  (Blank rows = not tested)    GAIT: Distance walked: >500 ' Assistive device utilized: None Level of assistance: Modified independence Comments: slow pace, extra time  LYMPHEDEMA LIFE IMPACT SCALE (LLIS): TBA  TREATMENT THIS DATE:  Anatomical measurements for LLE custom compression garment and HOS device- Replacements Pt edu  PATIENT EDUCATION:  Continued Pt/ CG edu for lymphedema self care home program throughout session. Topics include outcome of comparative limb volumetrics- starting limb volume differentials (LVDs), technology and gradient techniques used for short stretch, multilayer compression wrapping, simple self-MLD, therapeutic lymphatic pumping exercises, skin/nail care, LE precautions, compression garment recommendations and specifications, wear and care schedule and compression garment donning / doffing w assistive devices. Discussed progress towards all OT goals since commencing CDT. Discussed detrimental impact of obesity on lower and upper extremity lymphedema over time. Reviewed OT goals for lymphedema  care with Pt and discussed progress to date.  All questions answered to the Pt's satisfaction. Good return. Person educated: Patient  Education method: Explanation, Demonstration, and Handouts Education comprehension: verbalized understanding, returned demonstration, verbal cues required, and needs further education  Person educated: Patient Education method: Explanation, Demonstration, and Handouts Education comprehension: verbalized understanding, returned demonstration, and needs further education  LYMPHEDEMA SELF-CARE HOME PROGRAM: BLE lymphatic pumping there ex- 1 set of 10 each element, in order. Hold 5. 2 x daily  Daily, short stretch, thigh or knee length, multilayer compression bandages to one leg at a time during Intensive Phase CDT  Fit with replacements of LLE custom, medical grade, gradient, compression garments and HOS devices to control swelling and limit lymphedema progression.  In keeping with current Medicare allowances, Quantity 3: Replace custom, Jobst, Elvarex CLASSIC, flat knit compression class 2 (23-32 mmHg) thigh length compression stocking, with slant open toe,  T heel, and 5 cm wide silicone top band on oblique top edge. 3 quantity are needed for optimal hygiene to limit infection. Replace q 3-6 months and PRN to ensure optimal control of chronic, progressive limb swelling. In keeping with current Medicare allowances, Quantity 1: Replace custom, left, thigh length, Jobst RELAX, convoluted nighttime device designed for use during rest and sleep to maintain the results of swelling control achieved during the day. The flat knit, ccl 2 , Jobst Relax is medically necessary to counteract fluid accumulation at night and micro-massage tissue to limit additional fibrosis formation, to soften lipodermatosclerosis, and to stimulate blood and lymphatic flow during HOS.  Custom-made gradient compression garments and HOS devices are medically necessary because they are uniquely sized  and shaped to fit the exact dimensions of the affected extremities, and to provide appropriate medical grade, graduated compression essential for optimally managing chronic, progressive lymphedema. Multiple custom compression garments are needed to ensure proper hygiene to limit infection risk. Custom compression garments should be replaced q 3-6 months When worn consistently for optimal lymphedema self-management over time. HOS devices, medically necessary to limit fibrosis buildup in tissue, should be replaced q 2 years and PRN when worn out.    4. Cont to use Flexitouch advanced sequential pneumatic compression device 1 x daily with excellent results.  5. Daily simple self MLD used in conjunction with pneumatic device to engage thoracic duct return and to stimulate the terminus LNs in subclavian region.  6. Daily skin care with low ph lotion matching  skin ph to reduce infection risk.  7. Leg elevation when seated    ASSESSMENT:  CLINICAL IMPRESSION: Completed anatomical measurements for LLE custom compression stocking and HOS device. These are replacements for worn out existing garments/ devices. Reviewed measuring process, ordering with DME vendor , accessing insurance benefits, and fitting. Pt verbalized understanding of process and fitting plan. He will call to schedule fitting appointment within 10 days of garments arriving from vendor. Cont as per POC.   (10/25/24 Initial OT Eval:Jonathon Snow is a 12y o male presenting with mild, stage II, LLE lymphedema 2/2 CVI. Jonathon Snow is well known to this OT as he successfully completed Intensive Phase CDT in 2024 and transitioned to Self-Management Phase with medically necessary compression garments and devices, as well as skills needed for self management Pt has remained 100% compliant over time with LE self-care home program components , including use of an advanced, sequential compression device 1-2 x daily, custom compression daily and during HOS,  ther ex, and skin care. Jonathon Snow presents today with well managed LLE lymphedema, despite progression of lipodermatosclerosis, venous ulcer during the visit interval and other complex medical changes. His custom compression garments, however, are worn out and need of replacement. Skilled Occupational Therapy is medically necessary to identify correct garment and device specifications and to complete measurements to ensure optimal fit and function in this case. Replacements of like garments custom compression garments and devices are medically necessary to ensure optimal, ongoing self management to limit LE progression and further functional decline. )  OBJECTIVE IMPAIRMENTS: Abnormal gait, cardiopulmonary status limiting activity, decreased activity tolerance, decreased balance, decreased endurance, decreased knowledge of use of DME, decreased mobility, difficulty walking, dizziness, increased edema, increased fascial restrictions, impaired flexibility, postural dysfunction, pain, and chronic, progressive limb swelling, increased infection risk, increased falls risk.   ACTIVITY LIMITATIONS: Functional ambulation and mobility, basic and instrumental ADLs, productive activities, leisure activities, social participation  PERSONAL FACTORS: Behavior pattern, Fitness, Past/current experiences, Time since onset of injury/illness/exacerbation, and 3+ comorbidities: DVT, PE, MI, CVI, HTN, dyspnea, osteoarthritis and osteoporosis, severe depression, multiple fractures, CKD, CAD, are also affecting patient's functional outcome.   REHAB POTENTIAL: Good  EVALUATION COMPLEXITY: Low- garment replacement only. NO Complete Decongestive Therapy (CDT) at this time.   GOALS: Goals reviewed with patient? Yes  SHORT TERM GOALS: Target date: last OT visit (visit 3-6 and PRN)  Pt will demonstrate knowledge of lymphedema precautions by being able to name 5 elements and apply them to his daily life using a printed list for  reference by discharge to limit infection risk and LE progression. Baseline:Min-mod A Goal status: INITIAL   Pt will obtain replacements for worn out, existing custom compression garments and HOS device to limit infection risk and LE progression. Baseline:Existing garments and device are worn out and too large Goal status: INITIAL  2.  After skilled training Pt will be able to don and doff new replacement compression garments with modified assistance ( extra time, assistive devices PRN) to ensure highest level of independence with lymphedema self-management over time. Baseline: Min A Goal status: INITIAL PLAN:  OT FREQUENCY: 3-6 OT visits to complete custom compression garment measurements, specifications, Pt education and final  fitting and education session.  OT DURATION: 3-6 sessions. No additional Rx needed at this time.   PLANNED INTERVENTIONS: 97530- Therapeutic activity, 97535- Self Care, Patient/Family education for LE self-care home program, DME instructions, and Custom compression garment/ device replacement, assessment and training  PLAN FOR NEXT SESSION:  Complete  anatomical measurements and specifications for replacement custom compression garments and HOS device Pt edu for LE self- care   Zebedee Dec, MS, OTR/L, CLT-Snow 11/15/24 12:02 PM  11/15/2024, 12:02 PM

## 2024-11-16 ENCOUNTER — Ambulatory Visit: Admitting: Physical Therapy

## 2024-11-16 DIAGNOSIS — I951 Orthostatic hypotension: Secondary | ICD-10-CM | POA: Diagnosis not present

## 2024-11-16 DIAGNOSIS — R2681 Unsteadiness on feet: Secondary | ICD-10-CM

## 2024-11-16 DIAGNOSIS — R262 Difficulty in walking, not elsewhere classified: Secondary | ICD-10-CM

## 2024-11-16 DIAGNOSIS — R42 Dizziness and giddiness: Secondary | ICD-10-CM | POA: Diagnosis not present

## 2024-11-16 DIAGNOSIS — M5416 Radiculopathy, lumbar region: Secondary | ICD-10-CM

## 2024-11-16 DIAGNOSIS — M5412 Radiculopathy, cervical region: Secondary | ICD-10-CM

## 2024-11-16 DIAGNOSIS — M542 Cervicalgia: Secondary | ICD-10-CM

## 2024-11-16 DIAGNOSIS — M5459 Other low back pain: Secondary | ICD-10-CM | POA: Diagnosis not present

## 2024-11-16 DIAGNOSIS — I890 Lymphedema, not elsewhere classified: Secondary | ICD-10-CM | POA: Diagnosis not present

## 2024-11-16 NOTE — Therapy (Signed)
 OUTPATIENT PHYSICAL THERAPY TREATMENT  Patient Name: Jonathon Snow MRN: [id number] DOB:08-29-1961, 63 y.o., male Today's Date: 11/16/2024  PCP: Sowles, Krichna, MD REFERRING PROVIDER: [name], PA-C  END OF SESSION:  PT End of Session - 11/16/24 0801     Visit Number 18    Number of Visits 40    Date for Recertification  01/25/25    Authorization Type [place] Medicaid    Authorization Time Period 09/07/24-12/06/24 for 18 visits    Authorization - Number of Visits 18    Progress Note Due on Visit 20    PT Start Time 0804    PT Stop Time 0844    PT Time Calculation (min) 40 min    Equipment Utilized During Treatment Gait belt    Activity Tolerance Patient tolerated treatment well;No increased pain;Patient limited by pain    Behavior During Therapy [facility] for tasks assessed/performed           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   Overview:  superior sagittal sinus, left transverse sinus and cortical veins    Closed left hip fracture, initial encounter (HCC) 02/18/2023   COPD (chronic obstructive pulmonary disease) (HCC)    COVID-19 virus infection 12/2020   Depression    DVT (deep venous thrombosis) (HCC)    Dyspnea    GERD (gastroesophageal reflux disease)    Head injury 11/05/2023   Heart attack (HCC) 11/05/2023   Heel spur, left 02/16/2019   Heel spur, right 02/16/2019   Hematoma of groin 11/08/2023   Herpes zoster infection of lumbar region 02/20/2020   History of DVT (deep vein thrombosis) 03/22/2020   History of kidney stones    HTN (hypertension)    Hyperlipidemia    Hypertension    Long term current use of systemic steroids 11/08/2020   Lupus    Lymphedema 10/07/2018   Morbid obesity (HCC)    Opiate  abuse, episodic (HCC) 02/26/2018   Osteoporosis    Pain and swelling of right lower extremity 02/03/2023   Pneumonia    PONV (postoperative nausea and vomiting)    Postphlebitic syndrome with ulcer, 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   Presence of IVC filter 03/22/2020   Removed   Pulmonary embolism (HCC)    Renal disorder    Stage III   Severe episode of recurrent major depressive disorder, 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   Shock circulatory (HCC) 11/06/2023   Sleep apnea    STEMI (ST elevation myocardial infarction) (HCC) 11/05/2023   Past Surgical History:  Procedure Laterality Date   ANKLE SURGERY Right    BACK SURGERY     BRONCHIAL WASHINGS N/A 11/01/2021   Procedure: BRONCHIAL WASHINGS;  Surgeon: Parris Manna, MD;  Location: [facility] ORS;  Service: Thoracic;  Laterality: N/A;   COLONOSCOPY WITH PROPOFOL  N/A 05/28/2020   Procedure: COLONOSCOPY WITH PROPOFOL ;  Surgeon: Therisa Bi, MD;  Location: [facility] ENDOSCOPY;  Service: Endoscopy;  Laterality: N/A;   COLONOSCOPY WITH PROPOFOL  N/A 07/01/2023   Procedure: COLONOSCOPY WITH PROPOFOL ;  Surgeon: Therisa Bi, MD;  Location: [facility] ENDOSCOPY;  Service: Gastroenterology;  Laterality: N/A;   CORONARY ULTRASOUND/IVUS N/A 11/05/2023   Procedure: Coronary Ultrasound/IVUS;  Surgeon: Mady Bruckner, MD;  Location: [facility] INVASIVE CV LAB;  Service: Cardiovascular;  Laterality:  N/A;   CORONARY/GRAFT ACUTE MI REVASCULARIZATION N/A 11/05/2023   Procedure: Coronary/Graft Acute MI Revascularization;  Surgeon: Mady Bruckner, MD;  Location: [facility] INVASIVE CV LAB;  Service: Cardiovascular;  Laterality: N/A;   CYST EXCISION  92 or 93    Liver cyst removal [facility]   FLEXIBLE BRONCHOSCOPY N/A 11/01/2021   Procedure: FLEXIBLE BRONCHOSCOPY;  Surgeon: Parris Manna, MD;  Location: [facility] ORS;  Service: Thoracic;  Laterality: N/A;   FRACTURE SURGERY     HIP PINNING,CANNULATED Left 02/19/2023   Procedure: PERCUTANEOUS FIXATION OF FEMORAL NECK;  Surgeon: Tobie Priest, MD;  Location: [facility] ORS;  Service: Orthopedics;  Laterality: Left;   I & D EXTREMITY Right 04/29/2017   Procedure: IRRIGATION AND DEBRIDEMENT EXTREMITY;  Surgeon: Shelva Dunnings, MD;  Location: [facility] ORS;  Service: General;  Laterality: Right;   IRRIGATION AND DEBRIDEMENT ABSCESS Left 04/29/2017   Procedure: IRRIGATION AND DEBRIDEMENT Scrotal ABSCESS;  Surgeon: Shelva Dunnings, MD;  Location: [facility] ORS;  Service: General;  Laterality: Left;   LEFT HEART CATH AND CORONARY ANGIOGRAPHY N/A 11/05/2023   Procedure: LEFT HEART CATH AND CORONARY ANGIOGRAPHY;  Surgeon: Mady Bruckner, MD;  Location: [facility] INVASIVE CV LAB;  Service: Cardiovascular;  Laterality: N/A;   POLYPECTOMY  07/01/2023   Procedure: POLYPECTOMY;  Surgeon: Therisa Bi, MD;  Location: [facility] ENDOSCOPY;  Service: Gastroenterology;;   SPINE SURGERY     Patient Active Problem List   Diagnosis Date Noted   B12 deficiency 07/28/2024   Vitamin D  deficiency 07/28/2024   Cervical radicular pain 05/26/2024   History of thoracic spinal fusion (T7-T12) 05/26/2024   History of lumbar spinal fusion (L5-S1) 05/26/2024   Chronic pain syndrome 05/26/2024   Anxiety and depression 02/26/2024   Dyslipidemia 02/26/2024   Peripheral neuropathy 02/26/2024   Essential hypertension 02/26/2024   Vertigo 02/26/2024   Orthostatic hypotension 02/11/2024   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) 01/20/2024   Anxiety 12/14/2023   Overweight (BMI 25.0-29.9) 11/23/2023   Chronic heart failure with preserved ejection fraction (HFpEF) (HCC) 11/23/2023   BPH (benign prostatic hyperplasia) 11/23/2023   Unstable angina (HCC) 11/10/2023   Closed T10 spinal fracture (HCC) 11/06/2023   Closed tricolumnar fracture of thoracic vertebra (HCC) 11/06/2023   Thoracic spine instability 11/06/2023   Chronic bilateral low back pain with left-sided sciatica 07/07/2023   Neuropathy 07/07/2023   Hx of colonic polyps 07/01/2023    Adenomatous polyp of colon 07/01/2023   Erectile disorder 01/22/2023   Coronary artery disease involving native coronary artery of native heart without angina pectoris 09/09/2022   Varicose 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 01/03/2022   Immunocompromised state due to drug therapy 09/18/2021   Prediabetes 11/08/2020   Stage 3a chronic kidney disease (HCC) 11/08/2020   Seasonal allergies 11/08/2020   Rhinosinusitis 11/08/2020   Anticoagulant disorder 11/07/2020   Senile purpura 11/07/2020   MDD (major depressive disorder), recurrent episode, mild 09/12/2019   Other spondylosis with radiculopathy, lumbar region 02/16/2019   Osteoporosis 10/12/2018   Chronic venous insufficiency 10/07/2018   Lymphedema 10/07/2018   SLE glomerulonephritis syndrome, 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HCC) 03/03/2018   Syncope and collapse 12/29/2017   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) 10/29/2016   Hyperlipidemia LDL goal <70 01/15/2016   Obesity (BMI 30.0-34.9) 01/15/2016   Long term current use of anticoagulant 10/04/2015   Systemic lupus erythematosus (HCC) 05/30/2015   COPD, moderate (HCC) 06/27/2014   History of pulmonary embolism 12/11/2013  Nodule of right lung 12/11/2013   Cerebral venous sinus thrombosis 08/21/2013   Cystic disease of liver 08/21/2013   ONSET DATE: November 2024   REFERRING DIAG: imbalance  THERAPY DIAG:  Unsteadiness on feet  Cervicalgia  Other low back pain  Difficulty in walking, not elsewhere classified  Syncope due to orthostatic hypotension  Dizziness and giddiness  Radiculopathy, lumbar region  Radiculopathy, cervical region  Rationale for Evaluation and Treatment: Rehabilitation  SUBJECTIVE:                                                                                                                                                                                            SUBJECTIVE STATEMENT:    Pt reports that he is not noted significant improvement following steroid injection in Cervical region last Monday. Will have Thanksgiving at his son's house tomorrow.   PERTINENT HISTORY:   62yoM referred to OPPT for imbalance. Pt just finished OPPT for post surgical neck pain on 08/30/24 at [facility] so he could begin his balance therapy here. Pt has had issues with chronic orthostatic hypotension repeated syncope, fluctuating intolerance to upright. Pt has become quite good at managing his safety at home with symptoms fluctuations. PT checks BP daily, has PRN meds for excessively low or high pressures.   PAIN:  Are you having pain? 6/10  neck/shoulders; 6/10 low back   PRECAUTIONS: Fall WEIGHT BEARING RESTRICTIONS: No FALLS: Has patient fallen in last 6 months? Yes 1x in March 2025  LIVING ENVIRONMENT: Lives with: Alone  Lives in: 1 level  Stairs: threshold only Has following equipment at home: RW, canes, walking sticks.   PLOF: 2 years ago was able to go hiking for pleasure ab lib; used to get in and out kayak and do multi day treks overnight with kayak.   PATIENT GOALS: improve tolerance to upright mobility, stop falling as much   OBJECTIVE:  Note: Objective measures were completed at evaluation unless otherwise noted.   FUNCTIONAL TESTS:  TUG: 34sec (armless chair)  5xSTS: 54.9sec with BUE heavy use arm chair  : 14.63 Normal stance eyes closed: 9/22: 5-10sec for 3 bouts  Narrow stance eyes closed: 9/22: 17-18 sec for 3 bouts   PATIENT SURVEYS:  ABC Scale: 47.5%  TREATMENT DATE 11/02/24:    -nustep hill program x 5 min level 2-6. Cues for full ROM as tolerated  -overground AMB with up walker, 51ft, 2.5lb AW: 72m33sec  -wide tandem stance 2x30sec bilat - intermittent UE support due to lateral LOB to the L - Seated LAQ 2.5# AW x 8  - Seated  heel/toe raise 2.5# AW x 12 - Seated hip adduction pilates ball x 12 - side stepping at rail x 4 bil with light UE support  -overground AMB with up walker, 315ft, 2.5lb AW:  2:33   -Hoist Leg Press seat 12, level 4 3x12 (decreased from level 5 today)      PATIENT EDUCATION: Education details: activity modification when neck is flared up  Person educated: Programmer, Applications: Discussion, handout Education comprehension: Fair   HOME EXERCISE PROGRAM: Access Code: [email or web address] URL: [email or web address] Date: 09/12/2024 Prepared by: Massie Dollar  Exercises - sit to stand, arms crossed, 28-30 inch surface   - 3 x daily - 1 sets - 8 reps - Standing Near Stance in Corner with Eyes Closed  - 3 x daily - 1 sets - 5 reps - 30 hold - Semi-Tandem Balance at Counter Top Eyes Open  - 1 x daily - 7 x weekly - 3 sets - 4 reps - 15 hold - Standing March with Counter Support  - 1 x daily - 7 x weekly - 3 sets - 10 reps - Seated Long Arc Quad  - 1 x daily - 7 x weekly - 3 sets - 10 reps - Backward Walking with Counter Support  - 1 x daily - 7 x weekly - 3 sets - 5 reps  GOALS: Goals reviewed with patient? No  SHORT TERM GOALS: Target date: 10/07/24  Pt to report no falls in most recent 4 weeks to indicate good safety practices and symptoms management in the home.  Baseline: reports orthostatic s/s roughly 2x per week.  10/12/24: pt denies any falls in the last 4 weeks Goal status: MET  2.  Pt will demonstrate improvement in 5xSTS performance by >3 seconds.  Baseline: 54.9sec with BUE heavy use arm chair  10/12/24: 43.01 sec Goal status: MET  3.  Pt to demonstrate improvement in by 20% and backwards by 25%  Baseline: 9/22: forward: 14.63 sec (0.83m/s). Backward: 49.04 sec 0.74m/s  10/12/24: 15.21 sec (0.66 m/s); 45.77 sec (0.22 m/s) Goal status: ONGOING   LONG TERM GOALS: Target date: 01/25/25  Pt to demonstrate tolerance of >1012ft sustained AMB c LRAD without  presyncope, without LOB, and without increase in back pain >2/10 points NPRS.  Baseline: tolerated 375ft with mild-moderate increase in L hip and low back pain;  09/26/24: 417ft with Upwalker, 53m53s (no increased pain in back/hip)  10/12/24: 793' with Upwalker, 6 min (+2/10 pain increase in the lumbar region) 11/02/2024= 780 feet with Upright walker- (6/10 LBP pain before, during, and after today)  Goal status: PROGRESSING  2.  5xSTS performance, standard chair, hands free <20sec; hands free from 19.5 surface height in <12sec.   Baseline:  54.9sec with BUE heavy use arm chair  10/12/24: 43.01 sec 11/02/2024: 37.46 sec with BUE heavy use arm Goal status: PROGRESING  3.  Eye closed in narrow stance x60sec (5x without LOB and without need for hand support).  Baseline: 4-10 sec  10/12/24: 42 sec 11/02/2024: 60 sec x 1 time Goal status: PROGRESSING  4. Pt will improve BERG by at least 3 points in order to demonstrate  clinically significant improvement in balance.    Baseline: 11/02/2024= 32/56  Goal status: New  ASSESSMENT:  CLINICAL IMPRESSION: Pt continues to report no relief from cervical steroid injection last week. LBP mildly increased on this day with all mobility and strengthening compared to prior session.  Contnued focus on balance, overground activity tolerance, and BLE strength. Went to lengths to prevent exacerbation of pain during session, but was able to complete all interventions on this day with therapeutic rest break upon completion. Pt will continue to benefit from skilled therapy to address remaining deficits in order to improve overall QoL and return to PLOF.    OBJECTIVE IMPAIRMENTS: Decreased knowledge of condition, decreased use of DME, decreased mobility, difficulty walking, decreased strength, decreased ROM. ACTIVITY LIMITATIONS: Lifting, standing, walking, squatting, transfers, locomotion level PARTICIPATION LIMITATIONS: Cleaning, laundry, interpersonal  relationships, driving, yardwork, community activity.  PERSONAL FACTORS: Age, behavior pattern, education, past/current experiences, transportation, profession  are also affecting patient's functional outcome.  REHAB POTENTIAL: Good CLINICAL DECISION MAKING: Medium  EVALUATION COMPLEXITY: Moderate    PLAN: PT FREQUENCY: 1-2x/week PT DURATION: 12 weeks PLANNED INTERVENTIONS: 97110-Therapeutic exercises, 97530- Therapeutic activity, 97112- Neuromuscular re-education, 97535- Self Care, [id number]- Manual therapy, [phone]- Gait training, [phone]- Electrical stimulation (unattended), [phone]- Electrical stimulation (manual), Patient/Family education, Balance training, Stair training, Cryotherapy, and Moist heat   PLAN FOR NEXT SESSION:  -continued leg press,  -overground balance training with walking sparingly due to easily provoked back issues -continue overground AMB of 4-5 laps with upwalker, start adding 2-3lb AW    Massie Dollar PT, DPT  Physical Therapist - [facility]  8:02 AM 11/16/24
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
## 2024-11-21 ENCOUNTER — Ambulatory Visit

## 2024-11-22 ENCOUNTER — Encounter: Payer: Self-pay | Admitting: Dermatology

## 2024-11-22 ENCOUNTER — Ambulatory Visit: Admitting: Dermatology

## 2024-11-22 DIAGNOSIS — Z7189 Other specified counseling: Secondary | ICD-10-CM

## 2024-11-22 DIAGNOSIS — Z79899 Other long term (current) drug therapy: Secondary | ICD-10-CM

## 2024-11-22 DIAGNOSIS — B079 Viral wart, unspecified: Secondary | ICD-10-CM

## 2024-11-22 NOTE — Patient Instructions (Addendum)
 Instructions for After In-Office Application of Cantharidin  1. This is a strong medicine; please follow ALL instructions.  2. Gently wash off with soap and water  in four hours or sooner s directed by your physician.  3. **WARNING** this medicine can cause severe blistering, blood blisters, infection, and/or scarring if it is not washed off as directed.  4. Your progress will be rechecked in 1-2 months; call sooner if there are any questions or problems.    Due to recent changes in healthcare laws, you may see results of your pathology and/or laboratory studies on MyChart before the doctors have had a chance to review them. We understand that in some cases there may be results that are confusing or concerning to you. Please understand that not all results are received at the same time and often the doctors may need to interpret multiple results in order to provide you with the best plan of care or course of treatment. Therefore, we ask that you please give us  2 business days to thoroughly review all your results before contacting the office for clarification. Should we see a critical lab result, you will be contacted sooner.   If You Need Anything After Your Visit  If you have any questions or concerns for your doctor, please call our main line at [phone] and press option 4 to reach your doctor's medical assistant. If no one answers, please leave a voicemail as directed and we will return your call as soon as possible. Messages left after 4 pm will be answered the following business day.   You may also send us  a message via MyChart. We typically respond to MyChart messages within 1-2 business days.  For prescription refills, please ask your pharmacy to contact our office. Our fax number is [phone].  If you have an urgent issue when the clinic is closed that cannot wait until the next business day, you can page your doctor at the number below.    Please note that while we do our  best to be available for urgent issues outside of office hours, we are not available 24/7.   If you have an urgent issue and are unable to reach us , you may choose to seek medical care at your doctor's office, retail clinic, urgent care center, or emergency room.  If you have a medical emergency, please immediately call 911 or go to the emergency department.  Pager Numbers  - Dr. Hester: [phone]  - Dr. Jackquline: [phone]  - Dr. Claudene: [phone]   - Dr. Raymund: [phone]  In the event of inclement weather, please call our main line at [phone] for an update on the status of any delays or closures.  Dermatology Medication Tips: Please keep the boxes that topical medications come in in order to help keep track of the instructions about where and how to use these. Pharmacies typically print the medication instructions only on the boxes and not directly on the medication tubes.   If your medication is too expensive, please contact our office at [phone] option 4 or send us  a message through MyChart.   We are unable to tell what your co-pay for medications will be in advance as this is different depending on your insurance coverage. However, we may be able to find a substitute medication at lower cost or fill out paperwork to get insurance to cover a needed medication.   If a prior authorization is required to get your medication covered by your insurance company, please allow us   1-2 business days to complete this process.  Drug prices often vary depending on where the prescription is filled and some pharmacies may offer cheaper prices.  The website www.goodrx.com contains coupons for medications through different pharmacies. The prices here do not account for what the cost may be with help from insurance (it may be cheaper with your insurance), but the website can give you the price if you did not use any insurance.  - You can print the associated coupon and take it  with your prescription to the pharmacy.  - You may also stop by our office during regular business hours and pick up a GoodRx coupon card.  - If you need your prescription sent electronically to a different pharmacy, notify our office through [facility] or by phone at [phone] option 4.     Si Usted Necesita Algo Despus de Su Visita  Tambin puede enviarnos un mensaje a travs de Clinical Cytogeneticist. Por lo general respondemos a los mensajes de MyChart en el transcurso de 1 a 2 das hbiles.  Para renovar recetas, por favor pida a su farmacia que se ponga en contacto con nuestra oficina. Randi lakes de fax es [place] [phone].  Si tiene un asunto urgente cuando la clnica est cerrada y que no puede esperar hasta el siguiente da hbil, puede llamar/localizar a su doctor(a) al nmero que aparece a continuacin.   Por favor, tenga en cuenta que aunque hacemos todo lo posible para estar disponibles para asuntos urgentes fuera del horario de [place], no estamos disponibles las 24 horas del da, los 7 [address]  [address] de la [place].   Si tiene un problema urgente y no puede comunicarse con nosotros, puede optar por buscar atencin mdica  en el consultorio de su doctor(a), en una clnica privada, en un centro de atencin urgente o en una sala de emergencias.  Si tiene engineer, drilling, por favor llame inmediatamente al 911 o vaya a la sala de emergencias.  Nmeros de bper  - Dr. Hester: [phone]  - Dra. Jackquline: [phone]  - Dr. Claudene: [phone]  - Dra. Kitts: [phone]  En caso de inclemencias del [place], por favor llame a nuestra lnea principal al [phone] para una actualizacin sobre el estado de cualquier retraso o cierre.  Consejos para la medicacin en dermatologa: Por favor, guarde las cajas en las que vienen los medicamentos de uso tpico para ayudarle a seguir las instrucciones sobre dnde y cmo usarlos. Las farmacias generalmente imprimen las instrucciones  del medicamento slo en las cajas y no directamente en los tubos del [place].   Si su medicamento es muy caro, por favor, pngase en contacto con landry rieger llamando al [phone] y presione la opcin 4 o envenos un mensaje a travs de Clinical Cytogeneticist.   No podemos decirle cul ser su copago por los medicamentos por adelantado ya que esto es diferente dependiendo de la cobertura de su seguro. Sin embargo, es posible que podamos encontrar un medicamento sustituto a audiological scientist un formulario para que el seguro cubra el medicamento que se considera necesario.   Si se requiere una autorizacin previa para que su compaa de seguros cubra su medicamento, por favor permtanos de 1 a 2 das hbiles para completar este proceso.  Los precios de los medicamentos varan con frecuencia dependiendo del environmental consultant de dnde se surte la receta y alguna farmacias pueden ofrecer precios ms baratos.  El sitio web www.goodrx.com tiene cupones para medicamentos de health and safety inspector. Los precios aqu no tienen en  cuenta lo que podra costar con la ayuda del seguro (puede ser ms barato con su seguro), pero el sitio web puede darle el precio si no visual merchandiser.  - Puede imprimir el cupn correspondiente y llevarlo con su receta a la farmacia.  - Tambin puede pasar por nuestra oficina durante el horario de atencin regular y education officer, museum una tarjeta de cupones de GoodRx.  - Si necesita que su receta se enve electrnicamente a una farmacia diferente, informe a nuestra oficina a travs de MyChart de [place] o por telfono llamando al [phone] y presione la opcin 4.

## 2024-11-22 NOTE — Progress Notes (Signed)
   Follow-Up Visit   Subjective  Jonathon Snow is a 63 y.o. male who presents for the following: warts R index finger x 2, R palm x 1 70m f/u, LN2, Squaric adic 3% and Cantharidin plus at last visit  The following portions of the chart were reviewed this encounter and updated as appropriate: medications, allergies, medical history  Review of Systems:  No other skin or systemic complaints except as noted in HPI or Assessment and Plan.  Objective  Well appearing patient in no apparent distress; mood and affect are within normal limits.  A focused examination was performed of the following areas: Right hand  Relevant exam findings are noted in the Assessment and Plan.    Assessment & Plan   VIRAL WARTS, UNSPECIFIED TYPE (2) R index finger x 2 (2) Viral Wart (HPV) Counseling  Discussed viral / HPV (Human Papilloma Virus) etiology and risk of spread /infectivity to other areas of body as well as to other people.  Multiple treatments and methods may be required to clear warts and it is possible treatment may not be successful.  Treatment risks include discoloration; scarring and there is still potential for wart recurrence. Destruction of lesion - R index finger x 2 (2) Complexity: simple   Destruction method: cryotherapy   Informed consent: discussed and consent obtained   Timeout:  patient name, date of birth, surgical site, and procedure verified Lesion destroyed using liquid nitrogen: Yes   Region frozen until ice ball extended beyond lesion: Yes   Outcome: patient tolerated procedure well with no complications   Post-procedure details: wound care instructions given    Destruction of lesion - R index finger x 2 (2)  Destruction method: chemical removal   Informed consent: discussed and consent obtained   Timeout:  patient name, date of birth, surgical site, and procedure verified Chemical destruction method comment:  Squaric Acid 3%, Cantharidin Plus Application time:  4  hours Procedure instructions: patient instructed to wash and dry area   Outcome: patient tolerated procedure well with no complications   Post-procedure details: wound care instructions given   Additional details:  Patient advised to set alarm to remind them to wash off with soap and water  at the directed time.   Return if symptoms worsen or fail to improve.  I, Grayce Saunas, RMA, am acting as scribe for Alm Rhyme, MD .   Documentation: I have reviewed the above documentation for accuracy and completeness, and I agree with the above.  Alm Rhyme, MD

## 2024-11-23 ENCOUNTER — Ambulatory Visit: Admitting: Physical Therapy

## 2024-11-24 ENCOUNTER — Other Ambulatory Visit: Payer: Self-pay

## 2024-11-24 ENCOUNTER — Ambulatory Visit: Payer: Self-pay | Admitting: Psychiatry

## 2024-11-24 ENCOUNTER — Encounter: Payer: Self-pay | Admitting: Psychiatry

## 2024-11-24 VITALS — BP 135/80 | HR 69 | Temp 98.1°F | Ht 74.0 in | Wt 214.4 lb

## 2024-11-24 DIAGNOSIS — F411 Generalized anxiety disorder: Secondary | ICD-10-CM | POA: Diagnosis not present

## 2024-11-24 DIAGNOSIS — F331 Major depressive disorder, recurrent, moderate: Secondary | ICD-10-CM | POA: Diagnosis not present

## 2024-11-24 DIAGNOSIS — F332 Major depressive disorder, recurrent severe without psychotic features: Secondary | ICD-10-CM | POA: Diagnosis not present

## 2024-11-24 MED ORDER — BUSPIRONE HCL 10 MG PO TABS: 10.0000 mg | ORAL_TABLET | Freq: Two times a day (BID) | ORAL | 0 refills | Status: DC

## 2024-11-28 ENCOUNTER — Encounter (INDEPENDENT_AMBULATORY_CARE_PROVIDER_SITE_OTHER): Payer: Self-pay

## 2024-11-28 ENCOUNTER — Ambulatory Visit

## 2024-11-30 ENCOUNTER — Ambulatory Visit

## 2024-12-01 ENCOUNTER — Ambulatory Visit

## 2024-12-01 DIAGNOSIS — M216X2 Other acquired deformities of left foot: Secondary | ICD-10-CM

## 2024-12-01 DIAGNOSIS — M216X1 Other acquired deformities of right foot: Secondary | ICD-10-CM

## 2024-12-01 DIAGNOSIS — F332 Major depressive disorder, recurrent severe without psychotic features: Secondary | ICD-10-CM | POA: Diagnosis not present

## 2024-12-01 NOTE — Progress Notes (Signed)
 Orthotics are dispensed they are functioning well no acute complaints.  Break-in period was discussed.  If any foot and ankle issues arises he will come back and see me

## 2024-12-04 ENCOUNTER — Encounter: Payer: Self-pay | Admitting: Nurse Practitioner

## 2024-12-05 ENCOUNTER — Ambulatory Visit

## 2024-12-07 ENCOUNTER — Ambulatory Visit

## 2024-12-07 NOTE — Therapy (Incomplete)
 OUTPATIENT PHYSICAL THERAPY TREATMENT  Patient Name: Jonathon Snow MRN: [id number] DOB:Oct 15, 1961, 63 y.o., male Today's Date: 12/07/2024  PCP: Glenard Mire, MD REFERRING PROVIDER: Maree Jannett POUR, MD  END OF SESSION:     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   Overview:  superior sagittal sinus, left transverse sinus and cortical veins    Closed left hip fracture, initial encounter (HCC) 02/18/2023   COPD (chronic obstructive pulmonary disease) (HCC)    COVID-19 virus infection 12/2020   Depression    DVT (deep venous thrombosis) (HCC)    Dyspnea    GERD (gastroesophageal reflux disease)    Head injury 11/05/2023   Heart attack (HCC) 11/05/2023   Heel spur, left 02/16/2019   Heel spur, right 02/16/2019   Hematoma of groin 11/08/2023   Herpes zoster infection of lumbar region 02/20/2020   History of DVT (deep vein thrombosis) 03/22/2020   History of kidney stones    HTN (hypertension)    Hyperlipidemia    Hypertension    Long term current use of systemic steroids 11/08/2020   Lupus    Lymphedema 10/07/2018   Morbid obesity (HCC)    Opiate abuse, episodic (HCC) 02/26/2018   Osteoporosis    Pain and swelling of right lower extremity 02/03/2023   Pneumonia    PONV (postoperative nausea and vomiting)    Postphlebitic syndrome with ulcer, 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   Presence of IVC filter 03/22/2020   Removed   Pulmonary embolism (HCC)    Renal disorder    Stage III   Severe episode of recurrent major depressive disorder, 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   Shock circulatory (HCC) 11/06/2023   Sleep apnea    STEMI (ST elevation myocardial infarction) (HCC) 11/05/2023   Past Surgical History:  Procedure  Laterality Date   ANKLE SURGERY Right    BACK SURGERY     BRONCHIAL WASHINGS N/A 11/01/2021   Procedure: BRONCHIAL WASHINGS;  Surgeon: Parris Manna, MD;  Location: [facility] ORS;  Service: Thoracic;  Laterality: N/A;   COLONOSCOPY WITH PROPOFOL  N/A 05/28/2020   Procedure: COLONOSCOPY WITH PROPOFOL ;  Surgeon: Therisa Bi, MD;  Location: [facility] ENDOSCOPY;  Service: Endoscopy;  Laterality: N/A;   COLONOSCOPY WITH PROPOFOL  N/A 07/01/2023   Procedure: COLONOSCOPY WITH PROPOFOL ;  Surgeon: Therisa Bi, MD;  Location: [facility] ENDOSCOPY;  Service: Gastroenterology;  Laterality: N/A;   CORONARY ULTRASOUND/IVUS N/A 11/05/2023   Procedure: Coronary Ultrasound/IVUS;  Surgeon: Mady Bruckner, MD;  Location: [facility] INVASIVE CV LAB;  Service: Cardiovascular;  Laterality: N/A;   CORONARY/GRAFT ACUTE MI REVASCULARIZATION N/A 11/05/2023   Procedure: Coronary/Graft Acute MI Revascularization;  Surgeon: Mady Bruckner, MD;  Location: [facility] INVASIVE CV LAB;  Service: Cardiovascular;  Laterality: N/A;   CYST EXCISION  92 or 93    Liver cyst removal [facility]   FLEXIBLE BRONCHOSCOPY N/A 11/01/2021   Procedure: FLEXIBLE BRONCHOSCOPY;  Surgeon: Parris Manna, MD;  Location: [facility] ORS;  Service: Thoracic;  Laterality: N/A;   FRACTURE SURGERY     HIP PINNING,CANNULATED Left 02/19/2023   Procedure: PERCUTANEOUS FIXATION OF FEMORAL NECK;  Surgeon: Tobie Priest, MD;  Location: [facility] ORS;  Service: Orthopedics;  Laterality: Left;   I & D EXTREMITY Right 04/29/2017   Procedure: IRRIGATION AND DEBRIDEMENT EXTREMITY;  Surgeon:  Shelva Dunnings, MD;  Location: [facility] ORS;  Service: General;  Laterality: Right;   IRRIGATION AND DEBRIDEMENT ABSCESS Left 04/29/2017   Procedure: IRRIGATION AND DEBRIDEMENT Scrotal ABSCESS;  Surgeon: Shelva Dunnings, MD;  Location: [facility] ORS;  Service: General;  Laterality: Left;   LEFT HEART CATH AND CORONARY ANGIOGRAPHY N/A 11/05/2023   Procedure: LEFT HEART CATH AND CORONARY ANGIOGRAPHY;  Surgeon: Mady Bruckner, MD;  Location: [facility] INVASIVE CV LAB;  Service: Cardiovascular;  Laterality: N/A;   POLYPECTOMY  07/01/2023   Procedure: POLYPECTOMY;  Surgeon: Therisa Bi, MD;  Location: [facility] ENDOSCOPY;  Service: Gastroenterology;;   SPINE SURGERY     Patient Active Problem List   Diagnosis Date Noted   B12 deficiency 07/28/2024   Vitamin D  deficiency 07/28/2024   Cervical radicular pain 05/26/2024   History of thoracic spinal fusion (T7-T12) 05/26/2024   History of lumbar spinal fusion (L5-S1) 05/26/2024   Chronic pain syndrome 05/26/2024   Anxiety and depression 02/26/2024   Dyslipidemia 02/26/2024   Peripheral neuropathy 02/26/2024   Essential hypertension 02/26/2024   Vertigo 02/26/2024   Orthostatic hypotension 02/11/2024   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) 01/20/2024   Anxiety 12/14/2023   Overweight (BMI 25.0-29.9) 11/23/2023   Chronic heart failure with preserved ejection fraction (HFpEF) (HCC) 11/23/2023   BPH (benign prostatic hyperplasia) 11/23/2023   Unstable angina (HCC) 11/10/2023   Closed T10 spinal fracture (HCC) 11/06/2023   Closed tricolumnar fracture of thoracic vertebra (HCC) 11/06/2023   Thoracic spine instability 11/06/2023   Chronic bilateral low back pain with left-sided sciatica 07/07/2023   Neuropathy 07/07/2023   Hx of colonic polyps 07/01/2023   Adenomatous polyp of colon 07/01/2023   Erectile disorder 01/22/2023   Coronary artery disease involving native coronary artery of native heart without angina pectoris 09/09/2022   Varicose 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 01/03/2022   Immunocompromised state due to drug therapy 09/18/2021   Prediabetes 11/08/2020   Stage 3a chronic kidney disease (HCC) 11/08/2020   Seasonal allergies 11/08/2020   Rhinosinusitis 11/08/2020   Anticoagulant disorder 11/07/2020   Senile purpura 11/07/2020   MDD (major depressive disorder), recurrent episode, mild 09/12/2019    Other spondylosis with radiculopathy, lumbar region 02/16/2019   Osteoporosis 10/12/2018   Chronic venous insufficiency 10/07/2018   Lymphedema 10/07/2018   SLE glomerulonephritis syndrome, 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HCC) 03/03/2018   Syncope and collapse 12/29/2017   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) 10/29/2016   Hyperlipidemia LDL goal <70 01/15/2016   Obesity (BMI 30.0-34.9) 01/15/2016   Long term current use of anticoagulant 10/04/2015   Systemic lupus erythematosus (HCC) 05/30/2015   COPD, moderate (HCC) 06/27/2014   History of pulmonary embolism 12/11/2013   Nodule of right lung 12/11/2013   Cerebral venous sinus thrombosis 08/21/2013   Cystic disease of liver 08/21/2013   ONSET DATE: November 2024   REFERRING DIAG: imbalance  THERAPY DIAG:  No diagnosis found.  Rationale for Evaluation and Treatment: Rehabilitation  SUBJECTIVE:  SUBJECTIVE STATEMENT:   Pt reports that he is not noted significant improvement following steroid injection in Cervical region last Monday. Will have Thanksgiving at his son's house tomorrow.   PERTINENT HISTORY:   62yoM referred to OPPT for imbalance. Pt just finished OPPT for post surgical neck pain on 08/30/24 at [facility] so he could begin his balance therapy here. Pt has had issues with chronic orthostatic hypotension repeated syncope, fluctuating intolerance to upright. Pt has become quite good at managing his safety at home with symptoms fluctuations. PT checks BP daily, has PRN meds for excessively low or high pressures.   PAIN:  Are you having pain? 6/10  neck/shoulders; 6/10 low back   PRECAUTIONS: Fall WEIGHT BEARING RESTRICTIONS: No FALLS: Has patient fallen in last 6 months? Yes 1x in March  2025  LIVING ENVIRONMENT: Lives with: Alone  Lives in: 1 level  Stairs: threshold only Has following equipment at home: RW, canes, walking sticks.   PLOF: 2 years ago was able to go hiking for pleasure ab lib; used to get in and out kayak and do multi day treks overnight with kayak.   PATIENT GOALS: improve tolerance to upright mobility, stop falling as much   OBJECTIVE:  Note: Objective measures were completed at evaluation unless otherwise noted.   FUNCTIONAL TESTS:  TUG: 34sec (armless chair)  5xSTS: 54.9sec with BUE heavy use arm chair  : 14.63 Normal stance eyes closed: 9/22: 5-10sec for 3 bouts  Narrow stance eyes closed: 9/22: 17-18 sec for 3 bouts   PATIENT SURVEYS:  ABC Scale: 47.5%                                                                                                                              TREATMENT DATE 12/07/24:    -nustep hill program x 5 min level 2-6. Cues for full ROM as tolerated  -overground AMB with up walker, 56ft, 2.5lb AW: 62m33sec  -wide tandem stance 2x30sec bilat - intermittent UE support due to lateral LOB to the L - Seated LAQ 2.5# AW x 8  - Seated heel/toe raise 2.5# AW x 12 - Seated hip adduction pilates ball x 12 - side stepping at rail x 4 bil with light UE support  -overground AMB with up walker, 366ft, 2.5lb AW:  2:33   -Hoist Leg Press seat 12, level 4 3x12 (decreased from level 5 today)      PATIENT EDUCATION: Education details: activity modification when neck is flared up  Person educated: Programmer, Applications: Discussion, handout Education comprehension: Fair   HOME EXERCISE PROGRAM: Access Code: [email or web address] URL: [email or web address] Date: 09/12/2024 Prepared by: Massie Dollar  Exercises - sit to stand, arms crossed, 28-30 inch surface   - 3 x daily - 1 sets - 8 reps - Standing Near Stance in Corner with Eyes Closed  - 3 x daily - 1 sets - 5 reps - 30 hold - Semi-Tandem Balance at [employer]  Eyes Open  - 1 x daily - 7 x weekly - 3 sets - 4 reps - 15 hold - Standing March with Counter Support  - 1 x daily - 7 x weekly - 3 sets - 10 reps - Seated Long Arc Quad  - 1 x daily - 7 x weekly - 3 sets - 10 reps - Backward Walking with Counter Support  - 1 x daily - 7 x weekly - 3 sets - 5 reps  GOALS: Goals reviewed with patient? No  SHORT TERM GOALS: Target date: 10/07/24  Pt to report no falls in most recent 4 weeks to indicate good safety practices and symptoms management in the home.  Baseline: reports orthostatic s/s roughly 2x per week.  10/12/24: pt denies any falls in the last 4 weeks Goal status: MET  2.  Pt will demonstrate improvement in 5xSTS performance by >3 seconds.  Baseline: 54.9sec with BUE heavy use arm chair  10/12/24: 43.01 sec Goal status: MET  3.  Pt to demonstrate improvement in by 20% and backwards by 25%  Baseline: 9/22: forward: 14.63 sec (0.39m/s). Backward: 49.04 sec 0.39m/s  10/12/24: 15.21 sec (0.66 m/s); 45.77 sec (0.22 m/s) Goal status: ONGOING   LONG TERM GOALS: Target date: 01/25/25  Pt to demonstrate tolerance of >1072ft sustained AMB c LRAD without presyncope, without LOB, and without increase in back pain >2/10 points NPRS.  Baseline: tolerated 39ft with mild-moderate increase in L hip and low back pain;  09/26/24: 436ft with Upwalker, 60m53s (no increased pain in back/hip)  10/12/24: 793' with Upwalker, 6 min (+2/10 pain increase in the lumbar region) 11/02/2024= 780 feet with Upright walker- (6/10 LBP pain before, during, and after today)  Goal status: PROGRESSING  2.  5xSTS performance, standard chair, hands free <20sec; hands free from 19.5 surface height in <12sec.   Baseline:  54.9sec with BUE heavy use arm chair  10/12/24: 43.01 sec 11/02/2024: 37.46 sec with BUE heavy use arm Goal status: PROGRESING  3.  Eye closed in narrow stance x60sec (5x without LOB and without need for hand support).  Baseline: 4-10 sec   10/12/24: 42 sec 11/02/2024: 60 sec x 1 time Goal status: PROGRESSING  4. Pt will improve BERG by at least 3 points in order to demonstrate clinically significant improvement in balance.    Baseline: 11/02/2024= 32/56  Goal status: New  ASSESSMENT:  CLINICAL IMPRESSION: Pt continues to report no relief from cervical steroid injection last week. LBP mildly increased on this day with all mobility and strengthening compared to prior session.  Contnued focus on balance, overground activity tolerance, and BLE strength. Went to lengths to prevent exacerbation of pain during session, but was able to complete all interventions on this day with therapeutic rest break upon completion. Pt will continue to benefit from skilled therapy to address remaining deficits in order to improve overall QoL and return to PLOF.    OBJECTIVE IMPAIRMENTS: Decreased knowledge of condition, decreased use of DME, decreased mobility, difficulty walking, decreased strength, decreased ROM. ACTIVITY LIMITATIONS: Lifting, standing, walking, squatting, transfers, locomotion level PARTICIPATION LIMITATIONS: Cleaning, laundry, interpersonal relationships, driving, yardwork, community activity.  PERSONAL FACTORS: Age, behavior pattern, education, past/current experiences, transportation, profession  are also affecting patient's functional outcome.  REHAB POTENTIAL: Good CLINICAL DECISION MAKING: Medium  EVALUATION COMPLEXITY: Moderate    PLAN: PT FREQUENCY: 1-2x/week PT DURATION: 12 weeks PLANNED INTERVENTIONS: 97110-Therapeutic exercises, 97530- Therapeutic activity, [id number]- Neuromuscular re-education, 97535- Self Care, [id number]- Manual therapy, [id number]- Gait training,  [id number]- Electrical stimulation (unattended), [id number]- Electrical stimulation (manual), Patient/Family education, Balance training, Stair training, Cryotherapy, and Moist heat   PLAN FOR NEXT SESSION:  -continued leg press,  -overground balance training with walking  sparingly due to easily provoked back issues -continue overground AMB of 4-5 laps with upwalker, start adding 2-3lb AW    Chyrl London, PT Physical Therapist - [facility]  8:01 AM 12/07/2024

## 2024-12-08 ENCOUNTER — Other Ambulatory Visit: Payer: Self-pay | Admitting: Emergency Medicine

## 2024-12-08 ENCOUNTER — Telehealth: Payer: Self-pay | Admitting: Student

## 2024-12-08 DIAGNOSIS — R072 Precordial pain: Secondary | ICD-10-CM

## 2024-12-08 NOTE — Progress Notes (Signed)
 Order for Lexiscan  placed per Barnie Hila, NP.

## 2024-12-08 NOTE — Telephone Encounter (Signed)
 Spoke with patient. His insurance will not cover a cardiac PET stress. He agrees to proceed with nuclear stress testing to evaluate his ongoing precordial chest pain.    Informed Consent   Shared Decision Making/Informed Consent The risks [chest pain, shortness of breath, cardiac arrhythmias, dizziness, blood pressure fluctuations, myocardial infarction, stroke/transient ischemic attack, nausea, vomiting, allergic reaction, radiation exposure, metallic taste sensation and life-threatening complications (estimated to be 1 in 10,000)], benefits (risk stratification, diagnosing coronary artery disease, treatment guidance) and alternatives of a nuclear stress test were discussed in detail with Jonathon Snow and he agrees to proceed.      Barnie HERO. Alyra Patty, DNP, NP-C  12/08/2024, 11:53 AM [facility] [address] Office [phone] Fax [phone]

## 2024-12-12 ENCOUNTER — Ambulatory Visit
Admission: RE | Admit: 2024-12-12 | Discharge: 2024-12-12 | Disposition: A | Discharge status: Home / Self Care | Attending: Family Medicine | Admitting: Family Medicine

## 2024-12-12 ENCOUNTER — Other Ambulatory Visit (HOSPITAL_COMMUNITY)
Admission: RE | Admit: 2024-12-12 | Discharge: 2024-12-12 | Disposition: A | Discharge status: Home / Self Care | Source: Ambulatory Visit | Attending: Family Medicine | Admitting: Family Medicine

## 2024-12-12 ENCOUNTER — Ambulatory Visit

## 2024-12-12 ENCOUNTER — Ambulatory Visit: Admitting: Family Medicine

## 2024-12-12 ENCOUNTER — Encounter: Payer: Self-pay | Admitting: Family Medicine

## 2024-12-12 ENCOUNTER — Ambulatory Visit
Admission: RE | Admit: 2024-12-12 | Discharge: 2024-12-12 | Disposition: A | Source: Ambulatory Visit | Attending: Family Medicine | Admitting: Family Medicine

## 2024-12-12 VITALS — BP 106/72 | HR 83 | Resp 16 | Ht 74.0 in | Wt 224.2 lb

## 2024-12-12 DIAGNOSIS — J4489 Other specified chronic obstructive pulmonary disease: Secondary | ICD-10-CM

## 2024-12-12 DIAGNOSIS — R0602 Shortness of breath: Secondary | ICD-10-CM | POA: Insufficient documentation

## 2024-12-12 DIAGNOSIS — R61 Generalized hyperhidrosis: Secondary | ICD-10-CM | POA: Insufficient documentation

## 2024-12-12 DIAGNOSIS — Z113 Encounter for screening for infections with a predominantly sexual mode of transmission: Secondary | ICD-10-CM

## 2024-12-12 DIAGNOSIS — R350 Frequency of micturition: Secondary | ICD-10-CM

## 2024-12-12 NOTE — Progress Notes (Signed)
 Name: Jonathon Snow   MRN: [id number]    DOB: [birth year]   Date:12/12/2024       Progress Note  Subjective  Chief Complaint  Chief Complaint  Patient presents with   Night Sweats    On going for weeks   Discussed the use of AI scribe software for clinical note transcription with the patient, who gave verbal consent to proceed.  History of Present Illness Jonathon Snow is a 63 year old male with COPD, asthma, and lupus who presents with night sweats.  He has been experiencing night sweats for the past three weeks, occurring every night and sometimes during the day, particularly in his feet. No recent travel, camping, or exposure to sick individuals. He lives alone and has not noticed any significant weight loss or changes in appetite.  He has a history of COPD and asthma and uses albuterol  inhalers and nebulizer treatments. He has not seen his pulmonologist in over two years but believes his medications are being refilled at his current location. His chronic shortness of breath is stable, and he has no cough.  He has a history of unstable angina and heart disease, with a stress test scheduled for early January due to some discomfort and chest tightness. He does not associate these symptoms with his night sweats. There have been no new medications or changes in his current regimen.  He reports frequent urination and feels he is mostly emptying his bladder. He notes his appetite remains unchanged and his weight has been stable.  He has lupus, which affects his joints and causes achiness, but he has not experienced night sweats from lupus flares before. He also experiences Raynaud's phenomenon, with circulation issues in his fingers causing them to turn white and painful in cold weather.  No recent infections, cough, rashes, or lymph node swelling. He is sexually active with a consistent partner but not together all the time, and he agrees to STI screening.    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) 01/20/2024   Anxiety 12/14/2023   Overweight (BMI 25.0-29.9) 11/23/2023   Chronic heart failure with preserved ejection fraction (HFpEF) (HCC) 11/23/2023   BPH (benign prostatic hyperplasia) 11/23/2023   Unstable angina (HCC) 11/10/2023   Closed T10 spinal fracture (HCC) 11/06/2023   Closed tricolumnar fracture of thoracic vertebra (HCC) 11/06/2023   Thoracic spine instability 11/06/2023   Chronic bilateral low back pain with left-sided sciatica 07/07/2023   Neuropathy 07/07/2023   Hx of colonic polyps 07/01/2023   Adenomatous polyp of colon 07/01/2023   Erectile disorder 01/22/2023   Coronary artery disease involving native coronary artery of native heart without angina pectoris 09/09/2022   Varicose 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 01/03/2022   Immunocompromised state due to drug therapy 09/18/2021   Prediabetes 11/08/2020   Stage 3a chronic kidney disease (HCC) 11/08/2020   Seasonal allergies 11/08/2020   Rhinosinusitis 11/08/2020   Anticoagulant disorder 11/07/2020   Senile purpura 11/07/2020   MDD (major depressive disorder), recurrent  episode, mild 09/12/2019   Other spondylosis with radiculopathy, lumbar region 02/16/2019   Osteoporosis 10/12/2018   Chronic venous insufficiency 10/07/2018   Lymphedema 10/07/2018   SLE glomerulonephritis syndrome, 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HCC) 03/03/2018   Syncope and collapse 12/29/2017   Chronic embolism and  thrombosis of unspecified deep veins of left proximal lower extremity (HCC) 10/29/2016   Hyperlipidemia LDL goal <70 01/15/2016   Obesity (BMI 30.0-34.9) 01/15/2016   Long term current use of anticoagulant 10/04/2015   Systemic lupus erythematosus (HCC) 05/30/2015   COPD, moderate (HCC) 06/27/2014   History of pulmonary embolism 12/11/2013   Nodule of right lung 12/11/2013   Cerebral venous sinus thrombosis 08/21/2013   Cystic disease of liver 08/21/2013    Past Surgical History:  Procedure Laterality Date   ANKLE SURGERY Right    BACK SURGERY     BRONCHIAL WASHINGS N/A 11/01/2021   Procedure: BRONCHIAL WASHINGS;  Surgeon: Parris Manna, MD;  Location: [facility] ORS;  Service: Thoracic;  Laterality: N/A;   COLONOSCOPY WITH PROPOFOL  N/A 05/28/2020   Procedure: COLONOSCOPY WITH PROPOFOL ;  Surgeon: Therisa Bi, MD;  Location: [facility] ENDOSCOPY;  Service: Endoscopy;  Laterality: N/A;   COLONOSCOPY WITH PROPOFOL  N/A 07/01/2023   Procedure: COLONOSCOPY WITH PROPOFOL ;  Surgeon: Therisa Bi, MD;  Location: [facility] ENDOSCOPY;  Service: Gastroenterology;  Laterality: N/A;   CORONARY ULTRASOUND/IVUS N/A 11/05/2023   Procedure: Coronary Ultrasound/IVUS;  Surgeon: Mady Bruckner, MD;  Location: [facility] INVASIVE CV LAB;  Service: Cardiovascular;  Laterality: N/A;   CORONARY/GRAFT ACUTE MI REVASCULARIZATION N/A 11/05/2023   Procedure: Coronary/Graft Acute MI Revascularization;  Surgeon: Mady Bruckner, MD;  Location: [facility] INVASIVE CV LAB;  Service: Cardiovascular;  Laterality: N/A;   CYST EXCISION  92 or 93    Liver cyst removal [facility]   FLEXIBLE BRONCHOSCOPY N/A 11/01/2021   Procedure: FLEXIBLE BRONCHOSCOPY;  Surgeon: Parris Manna, MD;  Location: [facility] ORS;  Service: Thoracic;  Laterality: N/A;   FRACTURE SURGERY     HIP PINNING,CANNULATED Left 02/19/2023   Procedure: PERCUTANEOUS FIXATION OF FEMORAL NECK;  Surgeon: Tobie Priest, MD;  Location: [facility] ORS;  Service: Orthopedics;  Laterality: Left;   I & D  EXTREMITY Right 04/29/2017   Procedure: IRRIGATION AND DEBRIDEMENT EXTREMITY;  Surgeon: Shelva Dunnings, MD;  Location: [facility] ORS;  Service: General;  Laterality: Right;   IRRIGATION AND DEBRIDEMENT ABSCESS Left 04/29/2017   Procedure: IRRIGATION AND DEBRIDEMENT Scrotal ABSCESS;  Surgeon: Shelva Dunnings, MD;  Location: [facility] ORS;  Service: General;  Laterality: Left;   LEFT HEART CATH AND CORONARY ANGIOGRAPHY N/A 11/05/2023   Procedure: LEFT HEART CATH AND CORONARY ANGIOGRAPHY;  Surgeon: Mady Bruckner, MD;  Location: [facility] INVASIVE CV LAB;  Service: Cardiovascular;  Laterality: N/A;   POLYPECTOMY  07/01/2023   Procedure: POLYPECTOMY;  Surgeon: Therisa Bi, MD;  Location: [facility] ENDOSCOPY;  Service: Gastroenterology;;   SPINE SURGERY      Family History  Problem Relation Age of Onset   Hypertension Father    Heart disease Father    Cancer Father    Clotting disorder Mother    Hearing loss Mother    Kidney disease Brother    Heart attack Maternal Grandmother    Heart attack Maternal Grandfather    Heart attack Paternal Grandfather     Social History   Tobacco Use   Smoking status: Never   Smokeless tobacco: Never  Substance Use Topics   Alcohol use: No    Current Medications[1]  Allergies[2]  I personally reviewed active problem list, medication list, allergies, family  history with the patient/caregiver today.   ROS  Ten systems reviewed and is negative except as mentioned in HPI    Objective Physical Exam  CONSTITUTIONAL: Patient appears well-developed and well-nourished. No distress. HEENT: Head atraumatic, normocephalic, neck supple. CARDIOVASCULAR: Normal rate, regular rhythm and normal heart sounds. No murmur heard. No BLE edema. PULMONARY: Effort normal and breath sounds normal. Lungs clear to auscultation bilaterally. No respiratory distress. MUSCULOSKELETAL: Normal gait. Without gross motor or sensory deficit. PSYCHIATRIC: Patient has a normal mood and  affect. Behavior is normal. Judgment and thought content normal. SKIN: Skin normal.  Vitals:   12/12/24 1325  BP: 106/72  Pulse: 83  Resp: 16  SpO2: 97%  Weight: 224 lb 3.2 oz (101.7 kg)  Height: 6' 2 (1.88 m)    Body mass index is 28.79 kg/m.  Recent Results (from the past 2160 hours)  Basic metabolic panel with GFR     Status: Abnormal   Collection Time: 10/06/24  8:50 AM  Result Value Ref Range   Glucose 90 70 - 99 mg/dL   BUN 23 8 - 27 mg/dL   Creatinine, Ser 8.44 (H) 0.76 - 1.27 mg/dL   eGFR 50 (L) >40 fO/fpw/8.26   BUN/Creatinine Ratio 15 10 - 24   Sodium 140 134 - 144 mmol/L   Potassium 4.7 3.5 - 5.2 mmol/L   Chloride 103 96 - 106 mmol/L   CO2 19 (L) 20 - 29 mmol/L   Calcium  9.5 8.6 - 10.2 mg/dL  CBC     Status: Abnormal   Collection Time: 10/06/24  8:50 AM  Result Value Ref Range   WBC 3.3 (L) 3.4 - 10.8 x10E3/uL   RBC 5.19 4.14 - 5.80 x10E6/uL   Hemoglobin 15.1 13.0 - 17.7 g/dL   Hematocrit 51.2 62.4 - 51.0 %   MCV 94 79 - 97 fL   MCH 29.1 26.6 - 33.0 pg   MCHC 31.0 (L) 31.5 - 35.7 g/dL   RDW 84.4 (H) 88.3 - 84.5 %   Platelets 136 (L) 150 - 450 x10E3/uL  Hepatic Function Panel     Status: Abnormal   Collection Time: 10/13/24 10:21 AM  Result Value Ref Range   Total Protein 6.9 6.0 - 8.5 g/dL   Albumin  4.1 3.9 - 4.9 g/dL   Bilirubin Total 0.7 0.0 - 1.2 mg/dL   Bilirubin, Direct 9.74 0.00 - 0.40 mg/dL   Alkaline Phosphatase 128 (H) 47 - 123 IU/L   AST 15 0 - 40 IU/L   ALT 13 0 - 44 IU/L  POC Hemoccult Bld/Stl (3-Cd Home Screen)     Status: None   Collection Time: 10/20/24  8:16 AM  Result Value Ref Range   Card #1 Date 10/17/24    Fecal Occult Blood, POC Negative Negative   Card #2 Date 10/18/2024    Card #2 Fecal Occult Blod, POC Negative    Card #3 Date 10/19/24    Card #3 Fecal Occult Blood, POC Negative     Diabetic Foot Exam:     PHQ2/9:    12/12/2024    1:24 PM 11/15/2024    2:18 PM 11/14/2024   11:03 AM 10/19/2024    1:23 PM  10/13/2024    8:24 AM  Depression screen PHQ 2/9  Decreased Interest 1 1 2 1 1   Down, Depressed, Hopeless 1 1 2 1 1   PHQ - 2 Score 2 2 4 2 2   Altered sleeping 1 1 2 1    Tired, decreased energy 3  3 3 3    Change in appetite 1 1 1 1    Feeling bad or failure about yourself  1 1 1 1    Trouble concentrating 0 0 0 0   Moving slowly or fidgety/restless 1 0 1 0   Suicidal thoughts 0 0 0 0   PHQ-9 Score 9 8 12 8     Difficult doing work/chores Not difficult at all Very difficult Not difficult at all Extremely dIfficult      Data saved with a previous flowsheet row definition    phq 9 is positive  Fall Risk:    12/12/2024    1:24 PM 11/15/2024    2:07 PM 11/07/2024    9:15 AM 10/19/2024    1:19 PM 10/13/2024    8:24 AM  Fall Risk   Falls in the past year? 0 1 0 1 1  Comment  One fall in March 2024 and March 2025     Number falls in past yr: 0 1  0 1  Injury with Fall? 0 1   0    Risk for fall due to : No Fall Risks History of fall(s);Impaired balance/gait;Impaired mobility  History of fall(s);Impaired balance/gait;Impaired mobility History of fall(s)  Follow up Falls evaluation completed Falls evaluation completed;Education provided;Falls prevention discussed  Falls evaluation completed Falls evaluation completed     Data saved with a previous flowsheet row definition     Assessment & Plan Night sweats, under evaluation Night sweats for three weeks, possibly related to terbinafine  use. Differential includes infection, inflammation, or medication side effects. Lupus-related immunosuppression increases infection risk. - Ordered urine culture. - Ordered CRP and sed rate. - Ordered CBC. - Ordered kidney and liver function tests. - Ordered HIV, syphilis, gonorrhea, and chlamydia tests. - Ordered chest x-ray. - Consider referral to hematologist or oncologist if initial tests are normal.  Chronic obstructive pulmonary disease with asthma Chronic shortness of breath with stable COPD  and asthma. Last pulmonology visit over two years ago.  Stable angina Intermittent chest discomfort, not severe. - Proceed with scheduled stress test in January.  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 - Advised wearing gloves in cold weather.  Urinary frequency with possible prostatic enlargement Increased urinary frequency with large prostate. No significant changes in bladder emptying. - Continue to monitor urinary symptoms and consider further evaluation if symptoms worsen.  Osteoporosis Managed with over-the-counter vitamins and prescribed medication.  Onychomycosis Treated with terbinafine  since October. Night sweats started after treatment, no direct correlation established.  General Health Maintenance Flu shot received. - Ensure all vaccinations are up to date.        [1]  Current Outpatient Medications:    albuterol  (VENTOLIN  HFA) 108 (90 Base) MCG/ACT inhaler, Inhale 2 puffs into the lungs every 6 (six) hours as needed for wheezing or shortness of breath., Disp: , Rfl:    alendronate (FOSAMAX) 70 MG tablet, Take 70 mg by mouth once a week., Disp: , Rfl:    apixaban  (ELIQUIS ) 5 MG TABS tablet, Take 1 tablet (5 mg total) by mouth 2 (two) times daily., Disp: 180 tablet, Rfl: 1   ARIPiprazole  (ABILIFY ) 2 MG tablet, Take 4 mg by mouth daily., Disp: , Rfl:    azelastine (ASTELIN) 0.1 % nasal spray, 2 sprays 2 (two) times daily., Disp: , Rfl:    BENLYSTA 200 MG/ML SOSY, , Disp: , Rfl:  busPIRone  (BUSPAR ) 10 MG tablet, Take 1 tablet (10 mg total) by mouth 2 (two) times daily., Disp: 180 tablet, Rfl: 0   busPIRone  (BUSPAR ) 5 MG tablet, Take 1 tablet (5 mg total) by mouth 2 (two) times daily., Disp: 180 tablet, Rfl: 0   carbidopa-levodopa (SINEMET IR) 25-100 MG tablet, Take by mouth., Disp: , Rfl:     cholecalciferol  (VITAMIN D3) 25 MCG (1000 UNIT) tablet, Take 1,000 Units by mouth daily., Disp: , Rfl:    clonazePAM  (KLONOPIN ) 0.5 MG tablet, Take 0.5 mg by mouth daily., Disp: , Rfl:    clopidogrel  (PLAVIX ) 75 MG tablet, TAKE 1 TABLET(75 MG) BY MOUTH DAILY, Disp: 90 tablet, Rfl: 0   cyanocobalamin  (VITAMIN B12) 1000 MCG/ML injection, Vit B12 1000 mcg IM once a week for 3 weeks then once a month for 4 months. During and after loading dose with injection treatment patient should also take 1000 mcg by mouth once a day., Disp: , Rfl:    enoxaparin  (LOVENOX ) 150 MG/ML injection, Inject 1 mL (150 mg total) into the skin daily. Take it in AM- starting 14th- take on 15th & 16th- DO NOTTake on 17th, NOV [date of procedure], Disp: 3 mL, Rfl: 0   famotidine  (PEPCID ) 20 MG tablet, TAKE 1 TABLET(20 MG) BY MOUTH TWICE DAILY, Disp: 180 tablet, Rfl: 0   fluticasone  (FLONASE ) 50 MCG/ACT nasal spray, Place 2 sprays into both nostrils daily., Disp: 16 g, Rfl: 6   gabapentin  (NEURONTIN ) 300 MG capsule, Take 1 capsule (300 mg total) by mouth 2 (two) times daily., Disp: 60 capsule, Rfl: 2   hydrALAZINE  (APRESOLINE ) 25 MG tablet, Take 1 tablet (25 mg total) by mouth 3 (three) times daily., Disp: 270 tablet, Rfl: 1   HYDROcodone -acetaminophen  (NORCO/VICODIN) 5-325 MG tablet, Take 1 tablet by mouth every 8 (eight) hours as needed for severe pain (pain score 7-10). Must last 30 days, Disp: 90 tablet, Rfl: 0   hydroxychloroquine (PLAQUENIL) 200 MG tablet, Take 200 mg by mouth 2 (two) times daily., Disp: , Rfl:    hydrOXYzine  (ATARAX ) 25 MG tablet, Take 25-50 mg by mouth at bedtime as needed., Disp: , Rfl:    loratadine  (CLARITIN ) 10 MG tablet, Take 10 mg by mouth daily., Disp: , Rfl:    losartan  (COZAAR ) 25 MG tablet, Take 25 mg by mouth daily., Disp: , Rfl:    meclizine  (ANTIVERT ) 12.5 MG tablet, Take 1 tablet (12.5 mg total) by mouth 3 (three) times daily as needed for dizziness., Disp: 30 tablet, Rfl: 0   metoprolol   succinate (TOPROL -XL) 25 MG 24 hr tablet, Take 1 tablet (25 mg total) by mouth daily., Disp: 90 tablet, Rfl: 3   midodrine  (PROAMATINE ) 10 MG tablet, Take 10 mg by mouth 3 (three) times daily., Disp: , Rfl:    montelukast  (SINGULAIR ) 10 MG tablet, Take 1 tablet (10 mg total) by mouth at bedtime., Disp: 90 tablet, Rfl: 1   Multiple Vitamins-Minerals (MULTIVITAMIN WITH MINERALS) tablet, Take 1 tablet by mouth daily., Disp: , Rfl:    mycophenolate  (CELLCEPT ) 500 MG tablet, Take 2 tablets (1,000 mg total) by mouth 2 (two) times daily. HOLD until neurosurgery is ok to restart this (waiting for incision to heal), Disp: , Rfl:    oxybutynin  (DITROPAN  XL) 15 MG 24 hr tablet, Take 15 mg by mouth daily., Disp: , Rfl:    pantoprazole  (PROTONIX ) 20 MG tablet, Take 1 tablet (20 mg total) by mouth 2 (two) times daily., Disp: 180 tablet, Rfl: 3   rosuvastatin  (CRESTOR )  20 MG tablet, Take 1 tablet (20 mg total) by mouth daily., Disp: 90 tablet, Rfl: 0   sertraline  (ZOLOFT ) 100 MG tablet, Take 1.5 tablets (150 mg total) by mouth daily., Disp: 135 tablet, Rfl: 1   silodosin  (RAPAFLO ) 8 MG CAPS capsule, Take 8 mg by mouth daily., Disp: , Rfl:    solifenacin  (VESICARE ) 5 MG tablet, Take 5 mg by mouth daily., Disp: , Rfl:    Syringe/Needle, Disp, (SYRINGE 3CC/25GX1) 25G X 1 3 ML MISC, To be used with Vit B12 1000 mcg IM once a week for 4 weeks then once a month for 4 months. During and after loading dose with injection treatment patient should also take 1000 mcg by mouth once a day., Disp: , Rfl:    tadalafil  (CIALIS ) 20 MG tablet, Take 1 tablet (20 mg total) by mouth daily as needed for erectile dysfunction. Avoid taking it if low blood pressure, Disp: 30 tablet, Rfl: 7   tamsulosin  (FLOMAX ) 0.4 MG CAPS capsule, Take 1 capsule (0.4 mg total) by mouth daily., Disp: 30 capsule, Rfl: 11   terbinafine  (LAMISIL ) 250 MG tablet, Take 1 tablet (250 mg total) by mouth daily., Disp: 90 tablet, Rfl: 0 [2]  Allergies Allergen  Reactions   Enalapril Other (See Comments)    Unknown reaction

## 2024-12-13 LAB — URINE CYTOLOGY ANCILLARY ONLY
Chlamydia: NEGATIVE
Comment: NEGATIVE
Comment: NEGATIVE
Comment: NORMAL
Neisseria Gonorrhea: NEGATIVE
Trichomonas: NEGATIVE

## 2024-12-14 ENCOUNTER — Ambulatory Visit: Admitting: Physical Therapy

## 2024-12-14 LAB — COMPREHENSIVE METABOLIC PANEL WITH GFR
AG Ratio: 1.4 (calc) (ref 1.0–2.5)
ALT: 17 U/L (ref 9–46)
AST: 16 U/L (ref 10–35)
Albumin: 3.9 g/dL (ref 3.6–5.1)
Alkaline phosphatase (APISO): 118 U/L (ref 35–144)
BUN/Creatinine Ratio: 18 (calc) (ref 6–22)
BUN: 27 mg/dL — ABNORMAL HIGH (ref 7–25)
CO2: 25 mmol/L (ref 20–32)
Calcium: 8.7 mg/dL (ref 8.6–10.3)
Chloride: 105 mmol/L (ref 98–110)
Creat: 1.48 mg/dL — ABNORMAL HIGH (ref 0.70–1.35)
Globulin: 2.7 g/dL (ref 1.9–3.7)
Glucose, Bld: 68 mg/dL (ref 65–99)
Potassium: 4.2 mmol/L (ref 3.5–5.3)
Sodium: 138 mmol/L (ref 135–146)
Total Bilirubin: 0.4 mg/dL (ref 0.2–1.2)
Total Protein: 6.6 g/dL (ref 6.1–8.1)
eGFR: 53 mL/min/1.73m2 — ABNORMAL LOW

## 2024-12-14 LAB — CBC WITH DIFFERENTIAL/PLATELET
Absolute Lymphocytes: 821 {cells}/uL — ABNORMAL LOW (ref 850–3900)
Absolute Monocytes: 590 {cells}/uL (ref 200–950)
Basophils Absolute: 22 {cells}/uL (ref 0–200)
Basophils Relative: 0.6 %
Eosinophils Absolute: 68 {cells}/uL (ref 15–500)
Eosinophils Relative: 1.9 %
HCT: 45.3 % (ref 39.4–51.1)
Hemoglobin: 14.4 g/dL (ref 13.2–17.1)
MCH: 28.6 pg (ref 27.0–33.0)
MCHC: 31.8 g/dL (ref 31.6–35.4)
MCV: 89.9 fL (ref 81.4–101.7)
MPV: 12.2 fL (ref 7.5–12.5)
Monocytes Relative: 16.4 %
Neutro Abs: 2099 {cells}/uL (ref 1500–7800)
Neutrophils Relative %: 58.3 %
Platelets: 144 Thousand/uL (ref 140–400)
RBC: 5.04 Million/uL (ref 4.20–5.80)
RDW: 13.6 % (ref 11.0–15.0)
Total Lymphocyte: 22.8 %
WBC: 3.6 Thousand/uL — ABNORMAL LOW (ref 3.8–10.8)

## 2024-12-14 LAB — HIV ANTIBODY (ROUTINE TESTING W REFLEX)
HIV 1&2 Ab, 4th Generation: NONREACTIVE
HIV FINAL INTERPRETATION: NEGATIVE

## 2024-12-14 LAB — CULTURE, URINE COMPREHENSIVE
MICRO NUMBER:: 17391287
RESULT:: NO GROWTH
SPECIMEN QUALITY:: ADEQUATE

## 2024-12-14 LAB — SYPHILIS: RPR W/REFLEX TO RPR TITER AND TREPONEMAL ANTIBODIES, TRADITIONAL SCREENING AND DIAGNOSIS ALGORITHM: RPR Ser Ql: NONREACTIVE

## 2024-12-14 LAB — SEDIMENTATION RATE: Sed Rate: 2 mm/h (ref 0–20)

## 2024-12-14 LAB — TSH: TSH: 1.94 m[IU]/L (ref 0.40–4.50)

## 2024-12-14 LAB — C-REACTIVE PROTEIN: CRP: <3 mg/L

## 2024-12-16 ENCOUNTER — Ambulatory Visit: Payer: Self-pay | Admitting: Family Medicine

## 2024-12-19 ENCOUNTER — Other Ambulatory Visit: Payer: Self-pay | Admitting: Family Medicine

## 2024-12-19 ENCOUNTER — Ambulatory Visit

## 2024-12-19 ENCOUNTER — Telehealth: Payer: Self-pay

## 2024-12-19 ENCOUNTER — Encounter: Payer: Self-pay | Admitting: Family Medicine

## 2024-12-19 DIAGNOSIS — D708 Other neutropenia: Secondary | ICD-10-CM

## 2024-12-19 DIAGNOSIS — R61 Generalized hyperhidrosis: Secondary | ICD-10-CM

## 2024-12-21 ENCOUNTER — Other Ambulatory Visit: Payer: Self-pay

## 2024-12-21 ENCOUNTER — Ambulatory Visit

## 2024-12-21 NOTE — Patient Instructions (Signed)
 Visit Information  Jonathon Snow was given information about Medicaid Managed Care team care coordination services as a part of their Healthy [facility] Medicaid benefit. WAYLEN DEPAOLO   If you would like to schedule transportation through your Healthy [facility] plan, please call the following number at least 2 days in advance of your appointment: [phone]  For information about your ride after you set it up, call Ride Assist at [phone]. Use this number to activate a Will Call pickup, or if your transportation is late for a scheduled pickup. Use this number, too, if you need to make a change or cancel a previously scheduled reservation.  If you need transportation services right away, call [phone]. The after-hours call center is staffed 24 hours to handle ride assistance and urgent reservation requests (including discharges) 365 days a year. Urgent trips include sick visits, hospital discharge requests and life-sustaining treatment.  Call the [facility] Line at [phone], at any time, 24 hours a day, 7 days a week. If you are in danger or need immediate medical attention call 911.   Please see education materials related to COPD management and CHF exacerbation prevention provided by MyChart link.  Care plan and visit instructions communicated with the patient verbally today. Patient agrees to receive a copy in MyChart. Active MyChart status and patient understanding of how to access instructions and care plan via MyChart confirmed with patient.     Telephone follow up appointment with Managed Medicaid care management team member scheduled for: 01/19/2025 at 11:30 AM   [name] RN, BSN  [place] VBCI-Population Health RN Case Manager   Direct [phone]   Following is a copy of your plan of care:   Goals Addressed             This Visit's Progress    VBCI RN Care Plan   On track    Problems:  Chronic Disease Management support and education  needs related to CHF  Goal: Over the next 90 days the Patient will attend all scheduled medical appointments: with providers as evidenced by adherence to scheduled appointments         continue to work with RN Care Manager and/or Social Worker to address care management and care coordination needs related to CHF as evidenced by adherence to care management team scheduled appointments     take all medications exactly as prescribed and will call provider for medication related questions as evidenced by communication with provider for medication related questions     verbalize basic understanding of CHF disease process and self health management plan as evidenced by describing CHF in their own words and identifying when to seek medical attention   Interventions:   Heart Failure Interventions: Provided education on low sodium diet Assessed need for readable accurate scales in home Provided education about placing scale on hard, flat surface Advised patient to weigh each morning after emptying bladder Discussed importance of daily weight and advised patient to weigh and record daily Reviewed role of diuretics in prevention of fluid overload and management of heart failure; Discussed the importance of keeping all appointments with provider Provided patient with education about the role of exercise in the management of heart failure Screening for signs and symptoms of depression related to chronic disease state  Assessed social determinant of health barriers   Patient Self-Care Activities:  Attend all scheduled provider appointments Call pharmacy for medication refills 3-7 days in advance of running out of medications Call provider office  for new concerns or questions  Notify RN Care Manager of TOC call rescheduling needs Take medications as prescribed   call office if I gain more than 2 pounds in one day or 5 pounds in one week do ankle pumps when sitting keep legs up while sitting use salt  in moderation Update: 12/11/2024 Patient verbalizes watching salt intake to less than 1500 MG daily  watch for swelling in feet, ankles and legs every day weigh myself daily begin a heart failure diary bring diary to all appointments develop a rescue plan eat more whole grains, fruits and vegetables, lean meats and healthy fats track symptoms and what helps feel better or worse  Plan:  Telephone follow up appointment with care management team member scheduled for:  01-19-2025 at 11:30 AM           VBCI RN Care Plan   On track    Problems:  Chronic Disease Management support and education needs related to COPD  Goal: Over the next 90 days the Patient will attend all scheduled medical appointments: with all providers  as evidenced by adherence to scheduled appointments         continue to work with RN Care Manager and/or Social Worker to address care management and care coordination needs related to COPD as evidenced by adherence to care management team scheduled appointments     demonstrate a decrease COPD in exacerbations as evidenced by decreased shortness of breath and improved exercise tolerance  demonstrate understanding of rationale for each prescribed medication as evidenced by verbalizing the purpose and expected benefits of the medication     take all medications exactly as prescribed and will call provider for medication related questions as evidenced by contacting provider for any medication questions or concerns     verbalize basic understanding of COPD disease process and self health management plan as evidenced by medication adherence, avoiding triggers and when to seek emergency care   Interventions:   COPD Interventions: Advised patient to engage in light exercise as tolerated 3-5 days a week to aid in the the management of COPD Patient is attending an OPPT program Advised patient to track and manage COPD triggers Advised patient to self assesses COPD action plan zone  and make appointment with provider if in the yellow zone for 48 hours without improvement Assessed social determinant of health barriers Discussed the importance of adequate rest and management of fatigue with COPD Provided education about and advised patient to utilize infection prevention strategies to reduce risk of respiratory infection Provided instruction about proper use of medications used for management of COPD including inhalers Provided patient with basic written and verbal COPD education on self care/management/and exacerbation prevention Screening for signs and symptoms of depression related to chronic disease state   Patient Self-Care Activities:  Attend all scheduled provider appointments Call pharmacy for medication refills 3-7 days in advance of running out of medications Call provider office for new concerns or questions  Notify RN Care Manager of TOC call rescheduling needs Take medications as prescribed   identify and remove indoor air pollutants limit outdoor activity during cold weather do breathing exercises every day begin a symptom diary develop a rescue plan eliminate symptom triggers at home follow rescue plan if symptoms flare-up keep follow-up appointments: PCP 08-24-2024 at 10:20 am Patient attended PCP appointment get at least 7 to 8 hours of sleep at night Patient will look into a phone app such as Insight Timer to help relax at bedtime use  devices that will help like a cane, sock-puller or reacher practice relaxation or meditation daily do exercises in a comfortable position that makes breathing as easy as possible  Plan:  Telephone follow up appointment with care management team member scheduled for:  01-19-2025 11:30 AM           VBCI RN Care Plan   On track    Problems:  Chronic Disease Management support and education needs related to Anxiety  Goal: Over the next 90 days the Patient will continue to work with RN Care Manager and/or Social  Worker to address care management and care coordination needs related to Anxiety as evidenced by adherence to care management team scheduled appointments     demonstrate a decrease Anxiety in exacerbations as evidenced by verbalization of reduced anxiety during future assessments  demonstrate understanding of rationale for each prescribed medication as evidenced by verbalizing what each medication is for and the dosage needed     not experience hospital admission as evidenced by review of electronic medical record. Hospital Admissions in last 6 months =  UPDATE: 12/21/2024 No admissions in last 6 months  take all medications exactly as prescribed and will call provider for medication related questions as evidenced by communication with provider for medication questions      Interventions:   Evaluation of current treatment plan related to Anxiety, self-management and patient's adherence to plan as established by provider. Discussed plans with patient for ongoing care management follow up and provided patient with direct contact information for care management team Evaluation of current treatment plan related to anxiety and patient's adherence to plan as established by provider Provided education to patient and/or caregiver about advanced directives Reviewed medications with patient and discussed importance of continued compliance with regimen Discussed plans with patient for ongoing care management follow up and provided patient with direct contact information for care management team Advised patient to discuss any worsening anxiety symptoms with provider Screening for signs and symptoms of depression related to chronic disease state  Assessed social determinant of health barriers  Patient Self-Care Activities:  Attend all scheduled provider appointments Call pharmacy for medication refills 3-7 days in advance of running out of medications Call provider office for new concerns or questions   Notify RN Care Manager of TOC call rescheduling needs Take medications as prescribed   Relaxation techniques or app such as Insight Timer  UPDATE: 12/11/2024 Patient has been able to schedule appointment with [facility] counseling at [facility]   Plan:  Telephone follow up appointment with care management team member scheduled for:  01-19-2025 at 11:30 AM

## 2024-12-21 NOTE — Patient Outreach (Signed)
 Complex Care Management   Visit Note  12/21/2024  Name:  Jonathon Snow MRN: [id number] DOB: [birth year]  Situation: Referral received for Complex Care Management related to Heart Failure, COPD, and Anxiety I obtained verbal consent from Patient.  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   Overview:  superior sagittal sinus, left transverse sinus and cortical veins    Closed left hip fracture, initial encounter (HCC) 02/18/2023   COPD (chronic obstructive pulmonary disease) (HCC)    COVID-19 virus infection 12/2020   Depression    DVT (deep venous thrombosis) (HCC)    Dyspnea    GERD (gastroesophageal reflux disease)    Head injury 11/05/2023   Heart attack (HCC) 11/05/2023   Heel spur, left 02/16/2019   Heel spur, right 02/16/2019   Hematoma of groin 11/08/2023   Herpes zoster infection of lumbar region 02/20/2020   History of DVT (deep vein thrombosis) 03/22/2020   History of kidney stones    HTN (hypertension)    Hyperlipidemia    Hypertension    Long term current use of systemic steroids 11/08/2020   Lupus    Lymphedema 10/07/2018   Morbid obesity (HCC)    Opiate abuse, episodic (HCC) 02/26/2018   Osteoporosis    Pain and swelling of right lower extremity 02/03/2023   Pneumonia    PONV (postoperative nausea and vomiting)    Postphlebitic syndrome with ulcer, 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   Presence of IVC filter 03/22/2020   Removed   Pulmonary embolism (HCC)    Renal disorder    Stage III   Severe episode of recurrent major depressive disorder, 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   Shock circulatory (HCC) 11/06/2023   Sleep apnea    STEMI (ST elevation  myocardial infarction) (HCC) 11/05/2023    Assessment: Patient Reported Symptoms:  Cognitive Cognitive Status: No symptoms reported, Normal speech and language skills, Insightful and able to interpret abstract concepts   Health Maintenance Behaviors: None Healing Pattern: Slow Health Facilitated by: Pain control  Neurological Neurological Review of Symptoms: Weakness, Dizziness Neurological Management Strategies: Coping strategies, Routine screening, Medication therapy Neurological Self-Management Outcome: 4 (good)  HEENT HEENT Symptoms Reported: Tinnitus, Eye discharge HEENT Management Strategies: Coping strategies, Medication therapy, Routine screening HEENT Self-Management Outcome: 4 (good) HEENT Comment: L eye bothering me - saw eye doctor- taking drops Don't seem to be helping    Cardiovascular Cardiovascular Symptoms Reported: Chest pain or discomfort (more discomfort than pain  working with cardiologist) Does patient have uncontrolled Hypertension?: No Cardiovascular Management Strategies: Adequate rest, Coping strategies, Routine screening Weight: 216 lb (98 kg) Cardiovascular Self-Management Outcome: 4 (good)  Respiratory Respiratory Symptoms Reported: Shortness of breath, Wheezing Additional Respiratory Details: SOB Wheezing  Will call Dr Parris Hails Respiratory Management Strategies: Adequate rest, Medication therapy Respiratory Self-Management Outcome: 4 (good)  Endocrine Is patient diabetic?: No Endocrine Self-Management Outcome: 4 (good)  Gastrointestinal Gastrointestinal Symptoms Reported: Reflux/heartburn Gastrointestinal Management Strategies: Coping strategies, Medication therapy Gastrointestinal Self-Management Outcome: 4 (good)    Genitourinary Genitourinary Symptoms Reported: Frequency Genitourinary Management Strategies: Adequate rest, Coping strategies, Medication therapy Genitourinary Self-Management Outcome: 4 (good)  Integumentary Integumentary  Symptoms Reported: Sweating Additional Integumentary Details: Excessive Sweating  Skin Management Strategies: Coping strategies, Medication therapy Skin Self-Management Outcome: 3 (uncertain)  Musculoskeletal Musculoskelatal Symptoms Reviewed: Back pain, Difficulty walking, Limited mobility, Muscle pain, Unsteady gait Musculoskeletal Management Strategies: Adequate rest, Coping strategies Musculoskeletal Self-Management Outcome: 4 (good) Musculoskeletal Comment: OP PT [place] in the past year?: No Number of falls in past year: 1 or less Was there an injury with Fall?: No Fall Risk Category Calculator: 0 Patient Fall Risk Level: Low Fall Risk Patient at Risk for Falls Due to: History of fall(s)  Psychosocial Psychosocial Symptoms Reported: Depression - if selected complete PHQ 2-9, Anxiety - if selected complete GAD Behavioral Management Strategies: Coping strategies Behavioral Health Self-Management Outcome: 3 (uncertain) Behavioral Health Comment: [facility] Major Change/Loss/Stressor/Fears (CP): Medical condition, self Techniques to Cope with Loss/Stress/Change: Counseling, Medication Quality of Family Relationships: supportive (Daughter lives far away) Do you feel physically threatened by others?: No    12/21/2024    PHQ2-9 Depression Screening   Little interest or pleasure in doing things Several days  Feeling down, depressed, or hopeless Several days  PHQ-2 - Total Score 2  Trouble falling or staying asleep, or sleeping too much Nearly every day  Feeling tired or having little energy Nearly every day  Poor appetite or overeating  Several days  Feeling bad about yourself - or that you are a failure or have let yourself or your family down Several days  Trouble concentrating on things, such as reading the newspaper or watching television Not at all  Moving or speaking so slowly that other people could have noticed.  Or the opposite - being so fidgety or restless that you  have been moving around a lot more than usual Not at all  Thoughts that you would be better off dead, or hurting yourself in some way Not at all  PHQ2-9 Total Score 10  If you checked off any problems, how difficult have these problems made it for you to do your work, take care of things at home, or get along with other people Extremely dIfficult  Depression Interventions/Treatment Counseling ([facility] [facility] scheduled for January so may stop group counseling with [facility])    Today's Vitals   12/21/24 1305  BP: (!) 142/91  Pulse: 75  Weight: 216 lb (98 kg)  Height: 6' 3 (1.905 m)   Pain Scale: 0-10 Pain Score: 7  Pain Type: Chronic pain Pain Location: Back Pain Orientation: Lower, Upper Pain Descriptors / Indicators: Constant, Stabbing, Burning Pain Onset: On-going Patients Stated Pain Goal: 0 Pain Intervention(s): Medication (See eMAR), Rest Multiple Pain Sites: Yes  Medications Reviewed Today     Reviewed by Kay Hendricks MATSU, RN (Case Manager) on 12/21/24 at 1329  Med List Status: <None>   Medication Order Taking? Sig Documenting Provider Last Dose Status Informant  albuterol  (VENTOLIN  HFA) 108 (90 Base) MCG/ACT inhaler [id number] Yes Inhale 2 puffs into the lungs every 6 (six) hours as needed for wheezing or shortness of breath. [provider]  Active   alendronate (FOSAMAX) 70 MG tablet [id number] Yes Take 70 mg by mouth once a week. [provider]  Active   apixaban  (ELIQUIS ) 5 MG TABS tablet [id number] Yes Take 1 tablet (5 mg total) by mouth 2 (two) times daily. [name], [name], PA-C  Active Self  ARIPiprazole  (ABILIFY ) 2 MG tablet [id number] Yes Take 4 mg by mouth daily. [provider]  Active   azelastine (ASTELIN) 0.1 % nasal spray [id number] Yes 2 sprays 2 (two) times daily. [provider]  Active   BENLYSTA 200 MG/ML SOSY [id number] Yes  [provider]  Active   busPIRone  (BUSPAR ) 10 MG tablet [id number] Yes Take 1 tablet  (10 mg total) by mouth 2 (two) times daily. Vickey Mettle, MD  Active   busPIRone  (BUSPAR ) 5 MG tablet [id number] Yes Take 1 tablet (5 mg total) by mouth 2 (two) times daily. Hisada, Reina, MD  Active   carbidopa-levodopa (SINEMET IR) 25-100 MG tablet [id number] Yes Take by mouth. [provider]  Active   cholecalciferol  (VITAMIN D3) 25 MCG (1000 UNIT) tablet [id number] Yes Take 1,000 Units by mouth daily. [provider]  Active Self  clonazePAM  (KLONOPIN ) 0.5 MG tablet [id number] Yes Take 0.5 mg by mouth daily. [provider]  Active Self  clopidogrel  (PLAVIX ) 75 MG tablet [id number] Yes TAKE 1 TABLET(75 MG) BY MOUTH DAILY [name], [name], PA-C  Active   cyanocobalamin  (VITAMIN B12) 1000 MCG/ML injection [id number] Yes Vit B12 1000 mcg IM once a week for 3 weeks then once a month for 4 months. During and after loading dose with injection treatment patient should also take 1000 mcg by mouth once a day. [provider]  Active   enoxaparin  (LOVENOX ) 150 MG/ML injection [id number] Yes Inject 1 mL (150 mg total) into the skin daily. Take it in AM- starting 14th- take on 15th & 16th- DO NOTTake on 17th, NOV [date of procedure] Brahmanday, Govinda R, MD  Active   famotidine  (PEPCID ) 20 MG tablet [id number] Yes TAKE 1 TABLET(20 MG) BY MOUTH TWICE DAILY [name], [name], PA-C  Active   fluticasone  (FLONASE ) 50 MCG/ACT nasal spray [id number] Yes Place 2 sprays into both nostrils daily. [name], [name], PA-C  Active   gabapentin  (NEURONTIN ) 300 MG capsule [id number] Yes Take 1 capsule (300 mg total) by mouth 2 (two) times daily. Marcelino Nurse, MD  Active   hydrALAZINE  (APRESOLINE ) 25 MG tablet [id number] Yes Take 1 tablet (25 mg total) by mouth 3 (three) times daily. [name], [name], PA-C  Active Self  HYDROcodone -acetaminophen  (NORCO/VICODIN) 5-325 MG tablet [id number] Yes Take 1 tablet by mouth every 8 (eight) hours as needed for severe pain (pain score 7-10). Must last 30 days Lateef, Bilal, MD   Active   hydroxychloroquine (PLAQUENIL) 200 MG tablet [id number] Yes Take 200 mg by mouth 2 (two) times daily. [provider]  Active   hydrOXYzine  (ATARAX ) 25 MG tablet [id number] Yes Take 25-50 mg by mouth at bedtime as needed. [provider]  Active   loratadine  (CLARITIN ) 10 MG tablet [id number] Yes Take 10 mg by mouth daily. [provider]  Active   losartan  (COZAAR ) 25 MG tablet [id number] Yes Take 25 mg by mouth daily. [provider]  Active   meclizine  (ANTIVERT ) 12.5 MG tablet [id number] Yes Take 1 tablet (12.5 mg total) by mouth 3 (three) times daily as needed for dizziness. Jhonny Calvin NOVAK, MD  Active   metoprolol  succinate (TOPROL -XL) 25 MG 24 hr tablet [id number] Yes Take 1 tablet (25 mg total) by mouth daily. End, Lonni, MD  Active Self  midodrine  (PROAMATINE ) 10 MG tablet [id number] Yes Take 10 mg by mouth 3 (three) times daily. [provider]  Active   montelukast  (SINGULAIR ) 10 MG tablet [id number] Yes Take 1 tablet (10 mg total) by mouth at bedtime. [name], [name], PA-C  Active   Multiple Vitamins-Minerals (MULTIVITAMIN WITH MINERALS) tablet [id number] Yes Take 1 tablet by mouth daily. [provider]  Active Self  mycophenolate  (CELLCEPT ) 500 MG tablet [id number] Yes Take 2  tablets (1,000 mg total) by mouth 2 (two) times daily. HOLD until neurosurgery is ok to restart this (waiting for incision to heal) Marsa Edelman, DO  Active Self  oxybutynin  (DITROPAN  XL) 15 MG 24 hr tablet [id number] Yes Take 15 mg by mouth daily. [provider]  Active   pantoprazole  (PROTONIX ) 20 MG tablet [id number] Yes Take 1 tablet (20 mg total) by mouth 2 (two) times daily. Loistine Sober, NP  Active   rosuvastatin  (CRESTOR ) 20 MG tablet [id number] Yes Take 1 tablet (20 mg total) by mouth daily. Sowles, Krichna, MD  Active   sertraline  (ZOLOFT ) 100 MG tablet [id number] Yes Take 1.5 tablets (150 mg total) by mouth daily. [name], [name],  PA-C  Active Self  silodosin  (RAPAFLO ) 8 MG CAPS capsule [id number] Yes Take 8 mg by mouth daily. [provider]  Active   solifenacin  (VESICARE ) 5 MG tablet [id number] Yes Take 5 mg by mouth daily. [provider]  Active   Syringe/Needle, Disp, (SYRINGE 3CC/25GX1) 25G X 1 3 ML MISC [id number] Yes To be used with Vit B12 1000 mcg IM once a week for 4 weeks then once a month for 4 months. During and after loading dose with injection treatment patient should also take 1000 mcg by mouth once a day. [provider]  Active   tadalafil  (CIALIS ) 20 MG tablet [id number] Yes Take 1 tablet (20 mg total) by mouth daily as needed for erectile dysfunction. Avoid taking it if low blood pressure Vaillancourt, Samantha, PA-C  Active   tamsulosin  (FLOMAX ) 0.4 MG CAPS capsule [id number] Yes Take 1 capsule (0.4 mg total) by mouth daily. Penne Knee, MD  Active   terbinafine  (LAMISIL ) 250 MG tablet [id number] Yes Take 1 tablet (250 mg total) by mouth daily. Tobie Franky SQUIBB, DPM  Active   Med List Note Octavia Chrissie MATSU, RN 11/07/24 1011): MR 01/06/25 07-07-24 Clearance to stop Plavix  and Eliquis  sent to Dr End via inbox. Kt 10-06-24 Message sent via EPIC to Dr. Mady requesting clearance to stop Plavix  7 days and Eliquis  3 days. Dr End responded, requesting we consult hematologist. Attempted to call Dr. Cindy Joe, was not able to get the fax number. Message left.  Send a message to Dr. Joe per EPIC.DW 10-10-24 Faxed request to stop blood thinners sent to Dr. Joe DW  07-20-24 PA request for Hydrocodone  faxed to [facility]. DW            Recommendation:   Continue Current Plan of Care  Follow Up Plan:   Telephone follow-up in 1 month  [name] RN, BSN  [place] VBCI-Population Health RN Case Information Systems Manager [phone]

## 2024-12-23 ENCOUNTER — Encounter: Payer: Self-pay | Admitting: Family Medicine

## 2024-12-23 ENCOUNTER — Other Ambulatory Visit: Payer: Self-pay | Admitting: *Deleted

## 2024-12-26 ENCOUNTER — Ambulatory Visit

## 2024-12-27 ENCOUNTER — Ambulatory Visit: Admitting: Neurology

## 2024-12-27 DIAGNOSIS — H9202 Otalgia, left ear: Secondary | ICD-10-CM

## 2024-12-27 DIAGNOSIS — R202 Paresthesia of skin: Secondary | ICD-10-CM

## 2024-12-27 DIAGNOSIS — M542 Cervicalgia: Secondary | ICD-10-CM

## 2024-12-27 DIAGNOSIS — H6992 Unspecified Eustachian tube disorder, left ear: Secondary | ICD-10-CM

## 2024-12-27 MED ORDER — FLUTICASONE PROPIONATE 50 MCG/ACT NA SUSP: 2.0000 | Freq: Every day | NASAL | 6 refills | Status: AC

## 2024-12-27 NOTE — Telephone Encounter (Signed)
 fluticasone  (FLONASE ) 50 MCG/ACT nasal spray   Second request

## 2024-12-27 NOTE — Procedures (Signed)
 " [facility]  [address] [place], [address]  [place], [name] [address] Tel: [phone] Fax: [phone] Test Date:  12/27/2024  Patient: Jonathon Snow DOB: 25-Sep-1961 Physician: Venetia Potters, MD  Sex: Male Height: 6' 2 Ref Phys: Glade Boys, PA-C  ID#: [id number] Temp: 33.5 C Technician:    History: This is a 64 year old male with neck pain into his arms.  NCV & EMG Findings: Extensive electrodiagnostic evaluation of bilateral upper limbs shows: Bilateral median, ulnar, and radial sensory responses are within normal limits. Bilateral median (APB) and ulnar (ADM) motor responses are within normal limits. There is no evidence of active or chronic motor axon loss changes affecting any of the tested muscles on needle examination. Motor unit configuration and recruitment pattern is within normal limits.  Impression: This is a normal study. Specifically: No electrodiagnostic evidence of right or left cervical (C5-C8) motor radiculopathy. No electrodiagnostic evidence of right or left median mononeuropathy at or distal to the wrist (ie: carpal tunnel syndrome). Screening studies for right or left ulnar or radial mononeuropathies are normal.    ___________________________ Venetia Potters, MD    Nerve Conduction Studies Motor Nerve Results    Latency Amplitude F-Lat Segment Distance CV Comment  Site (ms) Norm (mV) Norm (ms)  (cm) (m/s) Norm   Left Median (APB) Motor  Wrist 2.5  < 4.0 8.6  > 5.0        Elbow 8.0 - 8.5 -  Elbow-Wrist 31 56  > 50   Right Median (APB) Motor  Wrist 2.4  < 4.0 7.0  > 5.0        Elbow 8.4 - 7.0 -  Elbow-Wrist 32.5 54  > 50   Left Ulnar (ADM) Motor  Wrist 2.3  < 3.1 10.9  > 7.0        Bel elbow 6.8 - 9.3 -  Bel elbow-Wrist 24.5 54  > 50   Ab elbow 8.7 - 8.7 -  Ab elbow-Bel elbow 10 53 -   Right Ulnar (ADM) Motor  Wrist 2.5  < 3.1 10.9  > 7.0        Bel elbow 6.8 - 9.6 -  Bel elbow-Wrist 25 58  > 50   Ab elbow 8.7 - 9.3 -  Ab elbow-Bel elbow 10  53 -    Sensory Sites    Neg Peak Lat Amplitude (O-P) Segment Distance Velocity Comment  Site (ms) Norm (V) Norm  (cm) (ms)   Left Median Sensory  Wrist-Dig II 3.4  < 3.8 34  > 10 Wrist-Dig II 13    Right Median Sensory  Wrist-Dig II 3.4  < 3.8 25  > 10 Wrist-Dig II 13    Left Radial Sensory  Forearm-Wrist 2.1  < 2.8 33  > 10 Forearm-Wrist 10    Right Radial Sensory  Forearm-Wrist 2.3  < 2.8 25  > 10 Forearm-Wrist 10    Left Ulnar Sensory  Wrist-Dig V 3.2  < 3.2 20  > 5 Wrist-Dig V 11    Right Ulnar Sensory  Wrist-Dig V 3.2  < 3.2 14  > 5 Wrist-Dig V 11     Electromyography   Side Muscle Ins.Act Fibs Fasc Recrt Amp Dur Poly Activation Comment  Right FDI Nml Nml Nml Nml Nml Nml Nml Nml N/A  Right EIP Nml Nml Nml Nml Nml Nml Nml Nml N/A  Right Pronator teres Nml Nml Nml Nml Nml Nml Nml Nml N/A  Right Biceps Nml Nml Nml  Nml Nml Nml Nml Nml N/A  Right Triceps Nml Nml Nml Nml Nml Nml Nml Nml N/A  Right Deltoid Nml Nml Nml Nml Nml Nml Nml Nml N/A  Left FDI Nml Nml Nml Nml Nml Nml Nml Nml N/A  Left EIP Nml Nml Nml Nml Nml Nml Nml Nml N/A  Left Pronator teres Nml Nml Nml Nml Nml Nml Nml Nml N/A  Left Biceps Nml Nml Nml Nml Nml Nml Nml Nml N/A  Left Triceps Nml Nml Nml Nml Nml Nml Nml Nml N/A  Left Deltoid Nml Nml Nml Nml Nml Nml Nml Nml N/A      Waveforms:  Motor           Sensory               "

## 2024-12-28 ENCOUNTER — Ambulatory Visit

## 2024-12-29 ENCOUNTER — Encounter: Admitting: Nurse Practitioner

## 2024-12-29 NOTE — Progress Notes (Unsigned)
 PROVIDER NOTE: Interpretation of information contained herein should be left to medically-trained personnel. Specific patient instructions are provided elsewhere under Patient Instructions section of medical record. This document was created in part using AI and STT-dictation technology, any transcriptional errors that may result from this process are unintentional.  Patient: Jonathon Snow  Service: E/M   PCP: Sowles, Krichna, MD  DOB: 31-Jul-1961  DOS: 01/02/2025  Provider: Emmy MARLA Blanch, NP  MRN: [id number]  Delivery: Face-to-face  Specialty: Interventional Pain Management  Type: Established Patient  Setting: Ambulatory outpatient facility  Specialty designation: 09  Referring Prov.: [name], PA-C  Location: Outpatient office facility       History of present illness (HPI) Jonathon Snow, a 64 y.o. year old male, is here today because of his low back pain and neck pain.. Jonathon Snow primary complain today is Back Pain and neck pain.   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); Osteoporosis; SLE glomerulonephritis syndrome, 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HCC); Other spondylosis with radiculopathy, lumbar region; Stage 3a chronic kidney disease (HCC); Chronic bilateral low back pain with left-sided sciatica; Neuropathy; Closed T10 spinal fracture (HCC); Closed tricolumnar fracture of thoracic vertebra (HCC); Thoracic spine instability; Chronic heart failure with preserved ejection fraction (HFpEF) (HCC); Peripheral neuropathy; Essential hypertension; Cervical radicular pain; History of thoracic spinal fusion (T7-T12); History of lumbar spinal fusion (L5-S1); and Chronic pain syndrome on their pertinent problem list.  Pain Assessment: Severity of Chronic pain is reported as a 7 /10. Location: Back Lower, Upper/radiates into  neck, shoulders and left leg. Onset: More than a month ago. Quality: Burning, Constant, Stabbing. Timing: Constant. Modifying factor(s): Denies. Vitals:  height is 6' 3 (1.905 m) and weight is 221 lb (100.2 kg). His temporal temperature is 98.2 F (36.8 C). His blood pressure is 142/103 (abnormal) and his pulse is 46 (abnormal). His respiration is 20 and oxygen saturation is 99%.  BMI: Estimated body mass index is 27.62 kg/m as calculated from the following:   Height as of this encounter: 6' 3 (1.905 m).   Weight as of this encounter: 221 lb (100.2 kg).  Last encounter: 10/13/2024. Last procedure: Visit date not found.  Reason for encounter: both, medication management and post-procedure evaluation and assessment. No change in medical history since last visit.  Patient's pain is at baseline.  Patient continues multimodal pain regimen as prescribed.  States that it provides pain relief and improvement in functional status.   Discussed the use of AI scribe software for clinical note transcription with the patient, who gave verbal consent to proceed.  History of Present Illness   Jonathon Snow is a 64 year old male who presents with persistent neck pain after a recent cervical facet block procedure.  He has persistent neck pain following a cervical facet block procedure performed on November 07, 2024. Temporary relief was experienced for about two to three days post-procedure, but the pain returned to a level of six out of ten, with fluctuations depending on the weather. On the day of the visit, he rates his pain as a seven out of ten, noting that it worsens with cold weather.  He is currently taking hydrocodone  10-325 mg for pain management. He also mentions having taken gabapentin  regularly for neuropathy pain in his feet, but he has run out of the medication and is not currently taking  it. He reports persistent foot pain due to neuropathy.     Procedure Procedure: Cervical Facet Medial  Branch Block(s) #1  Laterality: Bilateral  Level: C4, C5, and C6 Medial Branch Level(s). Injecting these levels blocks the C4-5 and C5-6 cervical facet joints.  Imaging: Fluoroscopic guidance Anesthesia: Local anesthesia (1-2% Lidocaine ) Sedation: Minimal Sedation                       DOS: 11/07/2024  Performed by: Wallie Sherry, MD   Purpose: Diagnostic/Therapeutic Indications: Cervicalgia (cervical spine axial pain) severe enough to impact quality of life or function. 1. Chronic pain syndrome   2. Cervical facet joint syndrome   3. Cervical radicular pain   4. Medication management     NAS-11 Pain score:        Pre-procedure: 6 /10        Post-procedure: 5 /10   Post-Procedure Evaluation   Effectiveness:  Initial hour after procedure: 100 % . Subsequent 4-6 hours post-procedure: 100 % . Analgesia past initial 6 hours: 50 % . Ongoing improvement:  Analgesic:  50 %  Function: Jonathon Snow reports improvement in function ROM: Jonathon Snow reports improvement in ROM Interpretation: [name] underwent a diagnostic/therapeutic cervical facet block on November 07, 2024.  He reports initially 100% pain relief during local anesthetic phase, followed by sustained ongoing 50% pain relief for approximately two to three days post-procedure, but the pain returned to a level of six out of ten, with fluctuations depending on the weather.  Pharmacotherapy Assessment   Hydrocodone -acetaminophen  (Norco/Vicodin) 5-325 mg tablet every 8 hours as needed for pain. MME=10  Journavx  50 mg tabs every 12 hours as needed for pain Monitoring: [place] PMP: PDMP reviewed during this encounter.       Pharmacotherapy: No side-effects or adverse reactions reported. Compliance: No problems identified. Effectiveness: Clinically acceptable.  Erlene Doyal SAUNDERS, [place]  01/02/2025 10:45 AM  Sign when Signing Visit Nursing Pain Medication Assessment:  Safety precautions to be maintained throughout the outpatient stay will  include: orient to surroundings, keep bed in low position, maintain call bell within reach at all times, provide assistance with transfer out of bed and ambulation.  Medication Inspection Compliance: Pill count conducted under aseptic conditions, in front of the patient. Neither the pills nor the bottle was removed from the patient's sight at any time. Once count was completed pills were immediately returned to the patient in their original bottle.  Medication: Hydrocodone /APAP Pill/Patch Count: 27 of 90 pills/patches remain Pill/Patch Appearance: Markings consistent with prescribed medication Bottle Appearance: Standard pharmacy container. Clearly labeled. Filled Date: 44 / 54 / 2025 Last Medication intake:  Today    UDS:  Summary  Date Value Ref Range Status  07/19/2024 FINAL  Final    Comment:    ==================================================================== Compliance Drug Analysis, Ur ==================================================================== Test                             Result       Flag       Units  Drug Present and Declared for Prescription Verification   Metoprolol                      PRESENT      EXPECTED  Drug Present not Declared for Prescription Verification   Tramadol   166          UNEXPECTED ng/mg creat   O-Desmethyltramadol            1291         UNEXPECTED ng/mg creat    Source of tramadol  is a prescription medication. O-desmethyltramadol    is an expected metabolite of tramadol .  Drug Absent but Declared for Prescription Verification   Clonazepam                      Not Detected UNEXPECTED ng/mg creat   Sertraline                      Not Detected UNEXPECTED   Aripiprazole                    Not Detected UNEXPECTED ==================================================================== Test                      Result    Flag   Units      Ref Range   Creatinine              94               mg/dL       >=79 ==================================================================== Declared Medications:  The flagging and interpretation on this report are based on the  following declared medications.  Unexpected results may arise from  inaccuracies in the declared medications.   **Note: The testing scope of this panel includes these medications:   Aripiprazole  (Abilify )  Clonazepam  (Klonopin )  Metoprolol   Sertraline  (Zoloft )   **Note: The testing scope of this panel does not include the  following reported medications:   Apixaban  (Eliquis )  Azelastine (Astelin)  Belimumab (Benlysta)  Carbidopa (Sinemet)  Clopidogrel  (Plavix )  Levodopa (Sinemet)  Midodrine  (Proamatine )  Montelukast  (Singulair )  Multivitamin  Mycophenolate  mofetil (Cellcept )  Oxybutynin  (Ditropan )  Pantoprazole  (Protonix )  Rosuvastatin  (Crestor )  Solifenacin  (Vesicare )  Vitamin B12  Vitamin D3 ==================================================================== For clinical consultation, please call [phone]. ====================================================================     No results found for: CBDTHCR No results found for: D8THCCBX No results found for: D9THCCBX  ROS  Constitutional: Denies any fever or chills Gastrointestinal: No reported hemesis, hematochezia, vomiting, or acute GI distress Musculoskeletal: Low back pain, neck pain Neurological: No reported episodes of acute onset apraxia, aphasia, dysarthria, agnosia, amnesia, paralysis, loss of coordination, or loss of consciousness  Medication Review  ARIPiprazole , Belimumab, HYDROcodone -acetaminophen , SYRINGE 3CC/25GX1, albuterol , alendronate, apixaban , azelastine, busPIRone , carbidopa-levodopa, cholecalciferol , clonazePAM , clopidogrel , cyanocobalamin , enoxaparin , famotidine , fluticasone , gabapentin , hydrALAZINE , hydrOXYzine , hydroxychloroquine, loratadine , losartan , meclizine , metoprolol  succinate, midodrine , montelukast ,  multivitamin with minerals, mycophenolate , oxybutynin , pantoprazole , rosuvastatin , sertraline , silodosin , solifenacin , tadalafil , tamsulosin , and terbinafine   History Review  Allergy: Jonathon Snow is allergic to enalapril. Drug: Jonathon Snow  reports no history of drug use. Alcohol:  reports no history of alcohol use. Tobacco:  reports that he has never smoked. He has never used smokeless tobacco. Social: Jonathon Snow  reports that he has never smoked. He has never used smokeless tobacco. He reports that he does not drink alcohol and does not use drugs. 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), Closed left hip fracture, initial encounter (HCC) (02/18/2023), COPD (chronic obstructive pulmonary disease) (HCC), COVID-19 virus infection (12/2020), Depression, DVT (deep venous thrombosis) (HCC), Dyspnea,  GERD (gastroesophageal reflux disease), Head injury (11/05/2023), Heart attack (HCC) (11/05/2023), Heel spur, left (02/16/2019), Heel spur, right (02/16/2019), Hematoma of groin (11/08/2023), Herpes zoster infection of lumbar region (02/20/2020), History of DVT (deep vein thrombosis) (03/22/2020), History of kidney stones, HTN (hypertension), Hyperlipidemia, Hypertension, Long term current use of systemic steroids (11/08/2020), Lupus, Lymphedema (10/07/2018), Morbid obesity (HCC), Opiate abuse, episodic (HCC) (02/26/2018), Osteoporosis, Pain and swelling of right lower extremity (02/03/2023), Pneumonia, PONV (postoperative nausea and vomiting), Postphlebitic syndrome with ulcer, 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 Surgical: Jonathon Snow  has a past surgical history that includes Cyst excision (92 or 93 ); Ankle surgery (Right); I & D extremity (Right, 04/29/2017); Irrigation and debridement abscess (Left, 04/29/2017); Colonoscopy with propofol  (N/A, 05/28/2020); Flexible bronchoscopy (N/A, 11/01/2021); Bronchial washings (N/A, 11/01/2021); Back surgery; Hip pinning, cannulated (Left, 02/19/2023); Colonoscopy with propofol  (N/A, 07/01/2023); polypectomy (07/01/2023); Coronary/Graft Acute MI Revascularization (N/A, 11/05/2023); LEFT HEART CATH AND CORONARY ANGIOGRAPHY (N/A, 11/05/2023); Coronary Ultrasound/IVUS (N/A, 11/05/2023); Fracture surgery; and Spine surgery. Family: family history includes Cancer in his father; Clotting disorder in his mother; Hearing loss in his mother; Heart attack in his maternal grandfather, maternal grandmother, and paternal grandfather; Heart disease in his father; Hypertension in his father; Kidney disease in his brother.  Laboratory Chemistry Profile   Renal Lab Results  Component Value Date   BUN 27 (H) 12/12/2024   CREATININE 1.48 (H) 12/12/2024   LABCREA 153 11/08/2023   BCR 18 12/12/2024   GFRAA 53 (L) 11/07/2020   GFRNONAA 50 (L) 02/27/2024    Hepatic Lab Results  Component Value Date   AST 16 12/12/2024   ALT 17 12/12/2024   ALBUMIN  4.1 10/13/2024   ALKPHOS 128 (H) 10/13/2024   LIPASE 34 11/05/2023    Electrolytes Lab Results  Component Value Date   NA 138 12/12/2024   K 4.2 12/12/2024   CL 105 12/12/2024   CALCIUM  8.7 12/12/2024   MG 2.0 11/06/2023   PHOS 3.8 11/06/2023    Bone Lab Results  Component Value Date   VD25OH 35 11/15/2018    Inflammation (CRP: Acute Phase) (ESR: Chronic Phase) Lab Results  Component Value Date   CRP <3.0 12/12/2024   ESRSEDRATE 2 12/12/2024   LATICACIDVEN 1.3 08/22/2021         Note:  Above Lab results reviewed.  Recent Imaging Review  DG Chest 2 View EXAM: 2 VIEW(S) XRAY OF THE CHEST 12/12/2024 03:04:32 PM  COMPARISON: 11/23/2023  CLINICAL HISTORY: sob, night sweats  FINDINGS:  LUNGS AND PLEURA: Stable scarring at right lung base. No pleural effusion. No pneumothorax.  HEART AND MEDIASTINUM: No acute abnormality of the cardiac and mediastinal silhouettes.  BONES AND SOFT TISSUES: Posterior fixation hardware and kyphoplasty are present from T7 through T11.  IMPRESSION: 1. No acute findings. 2. Stable scarring at right lung base.  Electronically signed by: Greig Pique MD MD 12/28/2024 11:35 PM EST RP Workstation: [id number] Note: Reviewed        Physical Exam  Vitals: BP (!) 142/103 (BP Location: Right Arm, Patient Position: Sitting, Cuff Size: Large)   Pulse (!) 46   Temp 98.2 F (36.8 C) (Temporal)   Resp 20   Ht 6' 3 (1.905 m)   Wt 221 lb (100.2 kg)   SpO2 99%   BMI 27.62 kg/m  BMI: Estimated body mass index is 27.62 kg/m as calculated from the following:   Height as of this encounter: 6' 3 (1.905 m).   Weight as of this encounter: 221 lb (100.2 kg). Ideal: Ideal body weight: 84.5 kg (186 lb 4.6 oz) Adjusted ideal body weight: 90.8 kg (200 lb 2.8 oz) General appearance: Well nourished, well developed, and well hydrated. In no apparent acute distress Mental status: Alert, oriented x 3 (person, place, & time)       Respiratory: No evidence of acute respiratory distress Eyes: PERLA  Musculoskeletal: +LBP Bilateral neck pain Assessment   Diagnosis Status  1. Cervical facet joint syndrome   2. Cervical radicular pain   3. Medication management   4. Cervical spondylosis without myelopathy   5. Chronic pain syndrome   6. Long term current use of anticoagulant    Improving Improving Controlled   Updated Problems: Problem  Medication Management  Cervical Spondylosis Without Myelopathy  Cervical Facet Joint Syndrome    Plan  of Care  Problem-specific:  Assessment and Plan    Chronic pain syndrome Limited relief from recent cervical facet block. Pain returned to baseline 6/10, occasionally 7/10. Discussed radiofrequency ablation for long-term relief, with variable duration. - Consider repeating cervical facet block if pain persists. - Discuss radiofrequency ablation after second facet block.  Cervical radiculopathy Pain exacerbated by weather changes. Managed with hydrocodone . - Continue hydrocodone  10 mg for pain management.  Peripheral neuropathy Foot pain persists without gabapentin . - Prescribed gabapentin  for neuropathy pain management.   Medication management: Patient's pain is controlled with hydrocodone -acetaminophen  (Norco) and gabapentin , will continue on current medication regimen.  Prescribing drug monitoring (PMP) reviewed, findings consistent with the use of prescribed medication and no evidence of narcotic misuse or abuse.  Urine drug screening (UDS) up to date and consistent with the use of prescribed medication.  No side effects or adverse reaction reported to medication.  Schedule follow-up in 90 days for medication management.     Mr. THADDIUS MANES has a current medication list which includes the following long-term medication(s): apixaban , carbidopa-levodopa, clonazepam , famotidine , fluticasone , hydralazine , losartan , metoprolol  succinate, montelukast , pantoprazole , rosuvastatin , sertraline , tadalafil , enoxaparin , and gabapentin .  Pharmacotherapy (Medications Ordered): Meds ordered this encounter  Medications   HYDROcodone -acetaminophen  (NORCO/VICODIN) 5-325 MG tablet    Sig: Take 1 tablet by mouth every 8 (eight) hours as needed for severe pain (pain score 7-10). Must last 30 days    Dispense:  90 tablet    Refill:  0    Chronic Pain: STOP Act (Not applicable) Fill 1 day early if closed on refill date. Avoid benzodiazepines within 8 hours of opioids   HYDROcodone -acetaminophen   (NORCO/VICODIN) 5-325 MG tablet    Sig: Take 1 tablet by mouth every 8 (eight) hours as needed for severe pain (pain score 7-10). Must last 30 days    Dispense:  90 tablet    Refill:  0    Chronic Pain: STOP Act (Not applicable) Fill 1 day early if closed on refill date. Avoid benzodiazepines within 8 hours of opioids   HYDROcodone -acetaminophen  (NORCO/VICODIN) 5-325 MG tablet    Sig: Take 1 tablet by mouth every 8 (eight) hours as needed for severe pain (pain score 7-10). Must last 30 days    Dispense:  90 tablet    Refill:  0    Chronic Pain: STOP Act (Not applicable) Fill 1 day early if closed on refill date. Avoid benzodiazepines within 8 hours of opioids   gabapentin  (NEURONTIN ) 300 MG  capsule    Sig: Take 1 capsule (300 mg total) by mouth 2 (two) times daily.    Dispense:  60 capsule    Refill:  2   Orders:  No orders of the defined types were placed in this encounter.      Return in about 3 months (around 04/02/2025) for (F2F), (MM), Emmy Blanch NP.    Recent Visits Date Type Provider Dept  11/07/24 Procedure visit Marcelino Nurse, MD [facility]  10/13/24 Office Visit Latanza Pfefferkorn K, NP [facility]  Showing recent visits within past 90 days and meeting all other requirements Today's Visits Date Type Provider Dept  01/02/25 Office Visit Derrin Currey K, NP [facility]  Showing today's visits and meeting all other requirements Future Appointments Date Type Provider Dept  03/27/25 Appointment Leone Putman K, NP [facility]  Showing future appointments within next 90 days and meeting all other requirements  I discussed the assessment and treatment plan with the patient. The patient was provided an opportunity to ask questions and all were answered. The patient agreed with the plan and demonstrated an understanding of the instructions.  Patient advised to call back or seek an in-person evaluation if the symptoms or condition worsens.  I  personally spent a total of 30 minutes in the care of the patient today including preparing to see the patient, getting/reviewing separately obtained history, performing a medically appropriate exam/evaluation, counseling and educating, placing orders, referring and communicating with other health care professionals, documenting clinical information in the EHR, independently interpreting results, communicating results, and coordinating care.   Note by: Miyonna Ormiston K Tamon Parkerson, NP (TTS and AI technology used. I apologize for any typographical errors that were not detected and corrected.) Date: 01/02/2025; Time: 11:11 AM

## 2024-12-30 ENCOUNTER — Ambulatory Visit: Admission: RE | Admit: 2024-12-30 | Source: Ambulatory Visit

## 2025-01-02 ENCOUNTER — Ambulatory Visit: Attending: Nurse Practitioner | Admitting: Nurse Practitioner

## 2025-01-02 ENCOUNTER — Encounter: Payer: Self-pay | Admitting: Nurse Practitioner

## 2025-01-02 ENCOUNTER — Ambulatory Visit

## 2025-01-02 ENCOUNTER — Telehealth: Payer: Self-pay

## 2025-01-02 VITALS — BP 142/103 | HR 46 | Temp 98.2°F | Resp 20 | Ht 75.0 in | Wt 221.0 lb

## 2025-01-02 DIAGNOSIS — G894 Chronic pain syndrome: Secondary | ICD-10-CM | POA: Insufficient documentation

## 2025-01-02 DIAGNOSIS — Z7901 Long term (current) use of anticoagulants: Secondary | ICD-10-CM | POA: Insufficient documentation

## 2025-01-02 DIAGNOSIS — M47812 Spondylosis without myelopathy or radiculopathy, cervical region: Secondary | ICD-10-CM | POA: Diagnosis not present

## 2025-01-02 DIAGNOSIS — Z79899 Other long term (current) drug therapy: Secondary | ICD-10-CM | POA: Diagnosis not present

## 2025-01-02 DIAGNOSIS — M5412 Radiculopathy, cervical region: Secondary | ICD-10-CM | POA: Insufficient documentation

## 2025-01-02 MED ORDER — HYDROCODONE-ACETAMINOPHEN 5-325 MG PO TABS: 1.0000 | ORAL_TABLET | Freq: Three times a day (TID) | ORAL | 0 refills | Status: AC | PRN

## 2025-01-02 MED ORDER — GABAPENTIN 300 MG PO CAPS: 300.0000 mg | ORAL_CAPSULE | Freq: Two times a day (BID) | ORAL | 2 refills | Status: AC

## 2025-01-02 NOTE — Patient Instructions (Signed)

## 2025-01-02 NOTE — Telephone Encounter (Signed)
 Author reaching out to check on pt, has canceled several appointments over past 6 weeks. Pt reports his chronic pain has been much worse and that his orthostatic symptoms also have been much worse without any clear explanation. Author/patient agreed to take a hiatus from PT until symptoms achieve a higher degree of stability. Tentatively planned for return to PT in March or April pending progress.   2:29 PM, 01/02/2025 Peggye JAYSON Linear, PT, DPT Physical Therapist - [facility]  Outpatient Physical Therapy- Main Campus [phone]

## 2025-01-02 NOTE — Progress Notes (Signed)
 Nursing Pain Medication Assessment:  Safety precautions to be maintained throughout the outpatient stay will include: orient to surroundings, keep bed in low position, maintain call bell within reach at all times, provide assistance with transfer out of bed and ambulation.  Medication Inspection Compliance: Pill count conducted under aseptic conditions, in front of the patient. Neither the pills nor the bottle was removed from the patient's sight at any time. Once count was completed pills were immediately returned to the patient in their original bottle.  Medication: Hydrocodone /APAP Pill/Patch Count: 27 of 90 pills/patches remain Pill/Patch Appearance: Markings consistent with prescribed medication Bottle Appearance: Standard pharmacy container. Clearly labeled. Filled Date: 44 / 63 / 2025 Last Medication intake:  Today

## 2025-01-03 ENCOUNTER — Encounter: Payer: Self-pay | Admitting: Neurosurgery

## 2025-01-03 ENCOUNTER — Inpatient Hospital Stay: Attending: Internal Medicine

## 2025-01-03 ENCOUNTER — Encounter: Payer: Self-pay | Admitting: Internal Medicine

## 2025-01-03 ENCOUNTER — Inpatient Hospital Stay: Admitting: Internal Medicine

## 2025-01-03 ENCOUNTER — Telehealth: Payer: Self-pay | Admitting: Internal Medicine

## 2025-01-03 ENCOUNTER — Ambulatory Visit: Admitting: Neurosurgery

## 2025-01-03 VITALS — BP 152/110 | Ht 75.0 in | Wt 221.0 lb

## 2025-01-03 VITALS — BP 133/78 | HR 63 | Temp 97.4°F | Resp 18 | Ht 75.0 in | Wt 221.6 lb

## 2025-01-03 DIAGNOSIS — N1832 Chronic kidney disease, stage 3b: Secondary | ICD-10-CM | POA: Diagnosis not present

## 2025-01-03 DIAGNOSIS — I2782 Chronic pulmonary embolism: Secondary | ICD-10-CM | POA: Diagnosis not present

## 2025-01-03 DIAGNOSIS — M542 Cervicalgia: Secondary | ICD-10-CM | POA: Diagnosis not present

## 2025-01-03 DIAGNOSIS — M25511 Pain in right shoulder: Secondary | ICD-10-CM

## 2025-01-03 DIAGNOSIS — I825Y2 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: Secondary | ICD-10-CM | POA: Insufficient documentation

## 2025-01-03 DIAGNOSIS — D696 Thrombocytopenia, unspecified: Secondary | ICD-10-CM | POA: Insufficient documentation

## 2025-01-03 DIAGNOSIS — G8929 Other chronic pain: Secondary | ICD-10-CM | POA: Diagnosis not present

## 2025-01-03 DIAGNOSIS — R61 Generalized hyperhidrosis: Secondary | ICD-10-CM

## 2025-01-03 DIAGNOSIS — Z7901 Long term (current) use of anticoagulants: Secondary | ICD-10-CM | POA: Insufficient documentation

## 2025-01-03 LAB — IRON AND TIBC
Iron: 267 ug/dL — ABNORMAL HIGH (ref 45–182)
Saturation Ratios: 83 % — ABNORMAL HIGH (ref 17.9–39.5)
TIBC: 322 ug/dL (ref 250–450)
UIBC: 55 ug/dL

## 2025-01-03 LAB — CBC WITH DIFFERENTIAL (CANCER CENTER ONLY)
Abs Immature Granulocytes: 0.01 K/uL (ref 0.00–0.07)
Basophils Absolute: 0 K/uL (ref 0.0–0.1)
Basophils Relative: 1 %
Eosinophils Absolute: 0.1 K/uL (ref 0.0–0.5)
Eosinophils Relative: 4 %
HCT: 49 % (ref 39.0–52.0)
Hemoglobin: 15.9 g/dL (ref 13.0–17.0)
Immature Granulocytes: 0 %
Lymphocytes Relative: 21 %
Lymphs Abs: 0.7 K/uL (ref 0.7–4.0)
MCH: 29.6 pg (ref 26.0–34.0)
MCHC: 32.4 g/dL (ref 30.0–36.0)
MCV: 91.2 fL (ref 80.0–100.0)
Monocytes Absolute: 0.6 K/uL (ref 0.1–1.0)
Monocytes Relative: 18 %
Neutro Abs: 1.9 K/uL (ref 1.7–7.7)
Neutrophils Relative %: 56 %
Platelet Count: 153 K/uL (ref 150–400)
RBC: 5.37 MIL/uL (ref 4.22–5.81)
RDW: 14.7 % (ref 11.5–15.5)
WBC Count: 3.4 K/uL — ABNORMAL LOW (ref 4.0–10.5)
nRBC: 0 % (ref 0.0–0.2)

## 2025-01-03 LAB — CMP (CANCER CENTER ONLY)
ALT: 17 U/L (ref 0–44)
AST: 17 U/L (ref 15–41)
Albumin: 4.3 g/dL (ref 3.5–5.0)
Alkaline Phosphatase: 135 U/L — ABNORMAL HIGH (ref 38–126)
Anion gap: 10 (ref 5–15)
BUN: 29 mg/dL — ABNORMAL HIGH (ref 8–23)
CO2: 24 mmol/L (ref 22–32)
Calcium: 9.7 mg/dL (ref 8.9–10.3)
Chloride: 105 mmol/L (ref 98–111)
Creatinine: 1.42 mg/dL — ABNORMAL HIGH (ref 0.61–1.24)
GFR, Estimated: 56 mL/min — ABNORMAL LOW
Glucose, Bld: 104 mg/dL — ABNORMAL HIGH (ref 70–99)
Potassium: 4.7 mmol/L (ref 3.5–5.1)
Sodium: 139 mmol/L (ref 135–145)
Total Bilirubin: 0.7 mg/dL (ref 0.0–1.2)
Total Protein: 7.4 g/dL (ref 6.5–8.1)

## 2025-01-03 LAB — FERRITIN: Ferritin: 94 ng/mL (ref 24–336)

## 2025-01-03 NOTE — Progress Notes (Signed)
 C/o having sweats, been going on for weeks. Happens day and night.

## 2025-01-03 NOTE — Progress Notes (Signed)
 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 SLE/Nephritis [cellcept /prednisolone ;~creat 1.5; Dr.Lateef]  Oncology History   No problem history exists.     HISTORY OF PRESENTING ILLNESS: Alone.  Ambulating independently.  Jonathon Snow 64 y.o.  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  Discussed the use of AI scribe software for clinical note transcription with the patient, who gave verbal consent to proceed.  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  Over the past month, he has developed persistent generalized hyperhidrosis, characterized by episodes of night sweats occurring both during sleep and while awake. The  sweating primarily involves the neck down or waist down, with new-onset sweating of the feet. He denies fever, has not experienced significant weight loss (weight fluctuates between 213 and 219 pounds), and has not noticed any lumps or bumps.  He remains on apixaban  5 mg twice daily for chronic DVT/PE, with no recent changes to his anticoagulation regimen and no new thrombotic events. He previously required temporary adjustment of anticoagulation for a neck injection procedure in November, which did not provide symptomatic relief.  He continues to experience symptoms related to lupus, which are stable but intermittently exacerbated and impact daily function. He also reports ongoing dizziness and vertigo, for which he uses abdominal and full-body compression garments as previously recommended.      Review of Systems  Constitutional:  Positive for malaise/fatigue. Negative for chills, diaphoresis, fever and weight loss.  HENT:  Negative for nosebleeds and sore throat.   Eyes:  Negative for double vision.  Respiratory:  Negative for cough, hemoptysis, sputum production, shortness of breath and wheezing.   Cardiovascular:  Positive for leg swelling. Negative for chest pain, palpitations and orthopnea.  Gastrointestinal:  Negative for abdominal pain, blood in stool, constipation, diarrhea, heartburn, melena, nausea and vomiting.  Genitourinary:  Negative for dysuria, frequency and urgency.  Musculoskeletal:  Negative for back pain and joint pain.  Skin:  Positive for rash.  Neurological:  Negative for dizziness, tingling, focal weakness, weakness and headaches.  Endo/Heme/Allergies:  Does not bruise/bleed easily.  Psychiatric/Behavioral:  Negative for depression. The patient is not nervous/anxious and does not have insomnia.      MEDICAL HISTORY:  Past Medical History:  Diagnosis Date   Acute renal failure with acute tubular necrosis superimposed on stage 3b chronic kidney disease (HCC)  04/27/2017   Acute urinary retention 11/11/2023   Anxiety    Asthma    Calculus of kidney 08/21/2013   Cerebral venous sinus thrombosis 08/21/2013   Overview:  superior sagittal sinus, left transverse sinus and cortical veins    Closed left hip fracture, initial encounter (HCC) 02/18/2023   COPD (chronic obstructive pulmonary disease) (HCC)    COVID-19 virus infection 12/2020   Depression    DVT (deep venous thrombosis) (HCC)    Dyspnea    GERD (gastroesophageal reflux disease)    Head injury 11/05/2023   Heart attack (HCC) 11/05/2023   Heel spur, left 02/16/2019   Heel spur, right 02/16/2019   Hematoma of groin 11/08/2023   Herpes zoster infection of lumbar region 02/20/2020   History of DVT (deep vein thrombosis) 03/22/2020   History of kidney stones    HTN (hypertension)    Hyperlipidemia    Hypertension    Long term current use of systemic steroids 11/08/2020   Lupus    Lymphedema 10/07/2018   Morbid obesity (HCC)    Opiate abuse, episodic (HCC) 02/26/2018   Osteoporosis    Pain and swelling of right lower extremity 02/03/2023   Pneumonia    PONV (postoperative nausea and vomiting)    Postphlebitic syndrome with ulcer, 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   Presence of IVC filter 03/22/2020   Removed   Pulmonary embolism (HCC)    Renal disorder    Stage III   Severe episode of recurrent major depressive disorder, 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   Shock circulatory (HCC) 11/06/2023   Sleep apnea    STEMI (ST elevation myocardial infarction) (HCC) 11/05/2023    SURGICAL HISTORY: Past Surgical History:  Procedure Laterality Date   ANKLE SURGERY Right    BACK SURGERY     BRONCHIAL WASHINGS N/A 11/01/2021   Procedure: BRONCHIAL WASHINGS;  Surgeon: Parris Manna, MD;  Location: [facility] ORS;  Service: Thoracic;  Laterality: N/A;   COLONOSCOPY WITH PROPOFOL  N/A 05/28/2020   Procedure: COLONOSCOPY WITH PROPOFOL ;  Surgeon: Therisa Bi, MD;  Location: [facility] ENDOSCOPY;   Service: Endoscopy;  Laterality: N/A;   COLONOSCOPY WITH PROPOFOL  N/A 07/01/2023   Procedure: COLONOSCOPY WITH PROPOFOL ;  Surgeon: Therisa Bi, MD;  Location: [facility] ENDOSCOPY;  Service: Gastroenterology;  Laterality: N/A;   CORONARY ULTRASOUND/IVUS N/A 11/05/2023   Procedure: Coronary Ultrasound/IVUS;  Surgeon: Mady Bruckner, MD;  Location: [facility] INVASIVE CV LAB;  Service: Cardiovascular;  Laterality: N/A;   CORONARY/GRAFT ACUTE MI REVASCULARIZATION N/A 11/05/2023   Procedure: Coronary/Graft Acute MI Revascularization;  Surgeon: Mady Bruckner, MD;  Location: [facility] INVASIVE CV LAB;  Service: Cardiovascular;  Laterality: N/A;   CYST EXCISION  92 or 93    Liver cyst removal [facility]   FLEXIBLE BRONCHOSCOPY N/A 11/01/2021   Procedure: FLEXIBLE BRONCHOSCOPY;  Surgeon: Parris Manna, MD;  Location: [facility] ORS;  Service: Thoracic;  Laterality: N/A;   FRACTURE SURGERY     HIP PINNING,CANNULATED Left 02/19/2023   Procedure: PERCUTANEOUS FIXATION OF FEMORAL NECK;  Surgeon: Tobie Priest, MD;  Location: [facility] ORS;  Service: Orthopedics;  Laterality: Left;   I & D EXTREMITY Right 04/29/2017   Procedure: IRRIGATION AND DEBRIDEMENT EXTREMITY;  Surgeon: Shelva Dunnings, MD;  Location: [facility] ORS;  Service: General;  Laterality: Right;   IRRIGATION AND DEBRIDEMENT ABSCESS Left 04/29/2017   Procedure: IRRIGATION AND DEBRIDEMENT Scrotal ABSCESS;  Surgeon: Shelva Dunnings, MD;  Location: [facility] ORS;  Service: General;  Laterality: Left;   LEFT HEART CATH AND CORONARY ANGIOGRAPHY N/A 11/05/2023   Procedure: LEFT  HEART CATH AND CORONARY ANGIOGRAPHY;  Surgeon: Mady Bruckner, MD;  Location: [facility] INVASIVE CV LAB;  Service: Cardiovascular;  Laterality: N/A;   POLYPECTOMY  07/01/2023   Procedure: POLYPECTOMY;  Surgeon: Therisa Bi, MD;  Location: [facility] ENDOSCOPY;  Service: Gastroenterology;;   SPINE SURGERY      SOCIAL HISTORY: Social History   Socioeconomic History   Marital status: Divorced    Spouse name: Not on  file   Number of children: 2   Years of education: Not on file   Highest education level: 12th grade  Occupational History   Not on file  Tobacco Use   Smoking status: Never   Smokeless tobacco: Never  Vaping Use   Vaping status: Never Used  Substance and Sexual Activity   Alcohol use: No   Drug use: No    Comment: PT DENIES   Sexual activity: Yes  Other Topics Concern   Not on file  Social History Narrative   Not on file   Social Drivers of Health   Tobacco Use: Low Risk (01/03/2025)   Patient History    Smoking Tobacco Use: Never    Smokeless Tobacco Use: Never    Passive Exposure: Not on file  Financial Resource Strain: Low Risk  (12/26/2024)   Received from [facility]   Overall Financial Resource Strain (CARDIA)    Difficulty of Paying Living Expenses: Not very hard  Recent Concern: Financial Resource Strain - Medium Risk (11/14/2024)   Overall Financial Resource Strain (CARDIA)    Difficulty of Paying Living Expenses: Somewhat hard  Food Insecurity: No Food Insecurity (12/26/2024)   Received from [facility]   Epic    Within the past 12 months, you worried that your food would run out before you got the money to buy more.: Never true    Within the past 12 months, the food you bought just didn't last and you didn't have money to get more.: Never true  Recent Concern: Food Insecurity - Food Insecurity Present (11/15/2024)   Epic    Worried About Programme Researcher, Broadcasting/film/video in the Last Year: Sometimes true    Ran Out of Food in the Last Year: Sometimes true  Transportation Needs: Unmet Transportation Needs (12/26/2024)   Received from [facility]   PRAPARE - Transportation    In the past 12 months, has lack of transportation kept you from medical appointments or from getting medications?: Yes    Lack of Transportation (Non-Medical): No  Physical Activity: Inactive (11/14/2024)   Exercise Vital Sign    Days of Exercise per  Week: 0 days    Minutes of Exercise per Session: Not on file  Stress: Stress Concern Present (11/14/2024)   [employer] of Occupational Health - Occupational Stress Questionnaire    Feeling of Stress: To some extent  Social Connections: Socially Isolated (11/14/2024)   Social Connection and Isolation Panel    Frequency of Communication with Friends and Family: Once a week    Frequency of Social Gatherings with Friends and Family: Never    Attends Religious Services: Never    Database Administrator or Organizations: No    Attends Engineer, Structural: Not on file    Marital Status: Divorced  Intimate Partner Violence: Not At Risk (12/21/2024)   Epic    Fear of Current or Ex-Partner: No    Emotionally Abused: No    Physically Abused: No    Sexually Abused: No  Depression (PHQ2-9):  Medium Risk (01/03/2025)   Depression (PHQ2-9)    PHQ-2 Score: 8  Alcohol Screen: Low Risk (09/19/2024)   Alcohol Screen    Last Alcohol Screening Score (AUDIT): 0  Housing: Low Risk  (12/26/2024)   Received from [facility]   Epic    In the last 12 months, was there a time when you were not able to pay the mortgage or rent on time?: No    In the past 12 months, how many times have you moved where you were living?: 0    At any time in the past 12 months, were you homeless or living in a shelter (including now)?: No  Recent Concern: Housing - High Risk (10/05/2024)   Received from [facility]   Epic    At any time in the past 12 months, were you homeless or living in a shelter (including now)?: No    In the last 12 months, was there a time when you were not able to pay the mortgage or rent on time?: Yes    In the past 12 months, how many times have you moved where you were living?: 0  Utilities: Not At Risk (12/26/2024)   Received from [facility]   Epic    In the past 12 months has the electric, gas, oil, or water  company threatened to shut  off services in your home?: No  Health Literacy: Adequate Health Literacy (09/19/2024)   B1300 Health Literacy    Frequency of need for help with medical instructions: Never    FAMILY HISTORY: Family History  Problem Relation Age of Onset   Hypertension Father    Heart disease Father    Cancer Father    Clotting disorder Mother    Hearing loss Mother    Kidney disease Brother    Heart attack Maternal Grandmother    Heart attack Maternal Grandfather    Heart attack Paternal Grandfather     ALLERGIES:  is allergic to enalapril.  MEDICATIONS:  Current Outpatient Medications  Medication Sig Dispense Refill   albuterol  (VENTOLIN  HFA) 108 (90 Base) MCG/ACT inhaler Inhale 2 puffs into the lungs every 6 (six) hours as needed for wheezing or shortness of breath.     alendronate (FOSAMAX) 70 MG tablet Take 70 mg by mouth once a week.     apixaban  (ELIQUIS ) 5 MG TABS tablet Take 1 tablet (5 mg total) by mouth 2 (two) times daily. 180 tablet 1   ARIPiprazole  (ABILIFY ) 2 MG tablet Take 4 mg by mouth daily.     azelastine (ASTELIN) 0.1 % nasal spray 2 sprays 2 (two) times daily.     BENLYSTA 200 MG/ML SOSY once a week.     busPIRone  (BUSPAR ) 10 MG tablet Take 1 tablet (10 mg total) by mouth 2 (two) times daily. 180 tablet 0   carbidopa-levodopa (SINEMET IR) 25-100 MG tablet Take by mouth.     cholecalciferol  (VITAMIN D3) 25 MCG (1000 UNIT) tablet Take 1,000 Units by mouth daily.     clonazePAM  (KLONOPIN ) 0.5 MG tablet Take 0.5 mg by mouth daily.     famotidine  (PEPCID ) 20 MG tablet TAKE 1 TABLET(20 MG) BY MOUTH TWICE DAILY 180 tablet 0   fluticasone  (FLONASE ) 50 MCG/ACT nasal spray Place 2 sprays into both nostrils daily. 16 g 6   gabapentin  (NEURONTIN ) 300 MG capsule Take 1 capsule (300 mg total) by mouth 2 (two) times daily. 60 capsule 2   hydrALAZINE  (APRESOLINE ) 25  MG tablet Take 1 tablet (25 mg total) by mouth 3 (three) times daily. 270 tablet 1   [START ON 01/05/2025]  HYDROcodone -acetaminophen  (NORCO/VICODIN) 5-325 MG tablet Take 1 tablet by mouth every 8 (eight) hours as needed for severe pain (pain score 7-10). Must last 30 days 90 tablet 0   [START ON 02/04/2025] HYDROcodone -acetaminophen  (NORCO/VICODIN) 5-325 MG tablet Take 1 tablet by mouth every 8 (eight) hours as needed for severe pain (pain score 7-10). Must last 30 days 90 tablet 0   [START ON 03/06/2025] HYDROcodone -acetaminophen  (NORCO/VICODIN) 5-325 MG tablet Take 1 tablet by mouth every 8 (eight) hours as needed for severe pain (pain score 7-10). Must last 30 days 90 tablet 0   hydroxychloroquine (PLAQUENIL) 200 MG tablet Take 200 mg by mouth 2 (two) times daily.     hydrOXYzine  (ATARAX ) 25 MG tablet Take 25-50 mg by mouth at bedtime as needed.     loratadine  (CLARITIN ) 10 MG tablet Take 10 mg by mouth daily.     losartan  (COZAAR ) 25 MG tablet Take 25 mg by mouth daily.     meclizine  (ANTIVERT ) 12.5 MG tablet Take 1 tablet (12.5 mg total) by mouth 3 (three) times daily as needed for dizziness. 30 tablet 0   metoprolol  succinate (TOPROL -XL) 25 MG 24 hr tablet Take 1 tablet (25 mg total) by mouth daily. 90 tablet 3   montelukast  (SINGULAIR ) 10 MG tablet Take 1 tablet (10 mg total) by mouth at bedtime. 90 tablet 1   Multiple Vitamins-Minerals (MULTIVITAMIN WITH MINERALS) tablet Take 1 tablet by mouth daily.     mycophenolate  (CELLCEPT ) 500 MG tablet Take 2 tablets (1,000 mg total) by mouth 2 (two) times daily. HOLD until neurosurgery is ok to restart this (waiting for incision to heal)     oxybutynin  (DITROPAN  XL) 15 MG 24 hr tablet Take 15 mg by mouth daily.     pantoprazole  (PROTONIX ) 20 MG tablet Take 1 tablet (20 mg total) by mouth 2 (two) times daily. 180 tablet 3   rosuvastatin  (CRESTOR ) 20 MG tablet Take 1 tablet (20 mg total) by mouth daily. 90 tablet 0   sertraline  (ZOLOFT ) 100 MG tablet Take 1.5 tablets (150 mg total) by mouth daily. 135 tablet 1   silodosin  (RAPAFLO ) 8 MG CAPS capsule Take 8 mg  by mouth daily.     solifenacin  (VESICARE ) 5 MG tablet Take 5 mg by mouth daily.     Syringe/Needle, Disp, (SYRINGE 3CC/25GX1) 25G X 1 3 ML MISC To be used with Vit B12 1000 mcg IM once a week for 4 weeks then once a month for 4 months. During and after loading dose with injection treatment patient should also take 1000 mcg by mouth once a day.     tadalafil  (CIALIS ) 20 MG tablet Take 1 tablet (20 mg total) by mouth daily as needed for erectile dysfunction. Avoid taking it if low blood pressure 30 tablet 7   tamsulosin  (FLOMAX ) 0.4 MG CAPS capsule Take 1 capsule (0.4 mg total) by mouth daily. 30 capsule 11   No current facility-administered medications for this visit.       PHYSICAL EXAMINATION:  Vitals:   01/03/25 0844  BP: 133/78  Pulse: 63  Resp: 18  Temp: (!) 97.4 F (36.3 C)  SpO2: 97%    Filed Weights   01/03/25 0844  Weight: 221 lb 9.6 oz (100.5 kg)     Physical Exam Vitals and nursing note reviewed.  HENT:     Head: Normocephalic and atraumatic.  Mouth/Throat:     Pharynx: Oropharynx is clear.  Eyes:     Extraocular Movements: Extraocular movements intact.     Pupils: Pupils are equal, round, and reactive to light.  Cardiovascular:     Rate and Rhythm: Normal rate and regular rhythm.  Pulmonary:     Comments: Decreased breath sounds bilaterally.  Abdominal:     Palpations: Abdomen is soft.  Musculoskeletal:        General: Normal range of motion.     Cervical back: Normal range of motion.  Skin:    General: Skin is warm.  Neurological:     General: No focal deficit present.     Mental Status: He is alert and oriented to person, place, and time.  Psychiatric:        Behavior: Behavior normal.        Judgment: Judgment normal.      LABORATORY DATA:  I have reviewed the data as listed Lab Results  Component Value Date   WBC 3.4 (L) 01/03/2025   HGB 15.9 01/03/2025   HCT 49.0 01/03/2025   MCV 91.2 01/03/2025   PLT 153 01/03/2025   Recent  Labs    02/16/24 0941 02/25/24 1608 02/26/24 0501 02/27/24 0807 07/28/24 1531 10/06/24 0850 10/13/24 1021 12/12/24 1441 01/03/25 0846  NA 136   < > 137 136   < > 140  --  138 139  K 4.2   < > 3.5 4.0   < > 4.7  --  4.2 4.7  CL 106   < > 109 110   < > 103  --  105 105  CO2 21*   < > 18* 20*   < > 19*  --  25 24  GLUCOSE 94   < > 145* 91   < > 90  --  68 104*  BUN 31*   < > 28* 30*   < > 23  --  27* 29*  CREATININE 1.48*   < > 1.61* 1.57*   < > 1.55*  --  1.48* 1.42*  CALCIUM  8.9   < > 9.0 8.6*   < > 9.5  --  8.7 9.7  GFRNONAA 53*   < > 48* 50*  --   --   --   --  56*  PROT 7.2  --   --   --    < >  --  6.9 6.6 7.4  ALBUMIN  3.7  --   --   --   --   --  4.1  --  4.3  AST 15  --   --   --    < >  --  15 16 17   ALT 15  --   --   --    < >  --  13 17 17   ALKPHOS 91  --   --   --   --   --  128*  --  135*  BILITOT 0.6  --   --   --    < >  --  0.7 0.4 0.7  BILIDIR  --   --   --   --   --   --  0.25  --   --    < > = values in this interval not displayed.    RADIOGRAPHIC STUDIES: I have personally reviewed the radiological images as listed and agreed with the findings in the report. DG Chest 2 View Result Date: 12/28/2024 EXAM: 2  VIEW(S) XRAY OF THE CHEST 12/12/2024 03:04:32 PM COMPARISON: 11/23/2023 CLINICAL HISTORY: sob, night sweats FINDINGS: LUNGS AND PLEURA: Stable scarring at right lung base. No pleural effusion. No pneumothorax. HEART AND MEDIASTINUM: No acute abnormality of the cardiac and mediastinal silhouettes. BONES AND SOFT TISSUES: Posterior fixation hardware and kyphoplasty are present from T7 through T11. IMPRESSION: 1. No acute findings. 2. Stable scarring at right lung base. Electronically signed by: Greig Pique MD MD 12/28/2024 11:35 PM EST RP Workstation: [id number]   NCV with EMG(electromyography) Result Date: 12/27/2024 Leigh Venetia CROME, MD     12/27/2024  9:30 AM [facility] [address] [place], [address]  [place], [name] [address] Tel: [phone] Fax: [phone] Test Date:  12/27/2024 Patient: Jonathon Snow DOB: 22-Nov-1961 Physician: Venetia Leigh, MD Sex: Male Height: 6' 2 Ref Phys: Glade Boys, PA-C ID#: [id number] Temp: 33.5 C Technician:  History: This is a 64 year old male with neck pain into his arms. NCV & EMG Findings: Extensive electrodiagnostic evaluation of bilateral upper limbs shows: Bilateral median, ulnar, and radial sensory responses are within normal limits. Bilateral median (APB) and ulnar (ADM) motor responses are within normal limits. There is no evidence of active or chronic motor axon loss changes affecting any of the tested muscles on needle examination. Motor unit configuration and recruitment pattern is within normal limits. Impression: This is a normal study. Specifically: No electrodiagnostic evidence of right or left cervical (C5-C8) motor radiculopathy. No electrodiagnostic evidence of right or left median mononeuropathy at or distal to the wrist (ie: carpal tunnel syndrome). Screening studies for right or left ulnar or radial mononeuropathies are normal. ___________________________ Venetia Leigh, MD Nerve Conduction Studies Motor Nerve Results   Latency Amplitude F-Lat Segment Distance CV Comment Site (ms) Norm (mV) Norm (ms)  (cm) (m/s) Norm  Left Median (APB) Motor Wrist 2.5  < 4.0 8.6  > 5.0       Elbow 8.0 - 8.5 -  Elbow-Wrist 31 56  > 50  Right Median (APB) Motor Wrist 2.4  < 4.0 7.0  > 5.0       Elbow 8.4 - 7.0 -  Elbow-Wrist 32.5 54  > 50  Left Ulnar (ADM) Motor Wrist 2.3  < 3.1 10.9  > 7.0       Bel elbow 6.8 - 9.3 -  Bel elbow-Wrist 24.5 54  > 50  Ab elbow 8.7 - 8.7 -  Ab elbow-Bel elbow 10 53 -  Right Ulnar (ADM) Motor Wrist 2.5  < 3.1 10.9  > 7.0       Bel elbow 6.8 - 9.6 -  Bel elbow-Wrist 25 58  > 50  Ab elbow 8.7 - 9.3 -  Ab elbow-Bel elbow 10 53 -  Sensory Sites   Neg Peak Lat Amplitude (O-P) Segment Distance Velocity Comment Site (ms) Norm (V) Norm  (cm) (ms)  Left Median Sensory Wrist-Dig II 3.4  < 3.8 34  > 10 Wrist-Dig II  13   Right Median Sensory Wrist-Dig II 3.4  < 3.8 25  > 10 Wrist-Dig II 13   Left Radial Sensory Forearm-Wrist 2.1  < 2.8 33  > 10 Forearm-Wrist 10   Right Radial Sensory Forearm-Wrist 2.3  < 2.8 25  > 10 Forearm-Wrist 10   Left Ulnar Sensory Wrist-Dig V 3.2  < 3.2 20  > 5 Wrist-Dig V 11   Right Ulnar Sensory Wrist-Dig V 3.2  < 3.2 14  > 5 Wrist-Dig V 11   Electromyography  Side Muscle Ins.Act Fibs Fasc Recrt Amp Dur Poly Activation Comment Right FDI Nml Nml Nml Nml Nml Nml Nml Nml N/A Right EIP Nml Nml Nml Nml Nml Nml Nml Nml N/A Right Pronator teres Nml Nml Nml Nml Nml Nml Nml Nml N/A Right Biceps Nml Nml Nml Nml Nml Nml Nml Nml N/A Right Triceps Nml Nml Nml Nml Nml Nml Nml Nml N/A Right Deltoid Nml Nml Nml Nml Nml Nml Nml Nml N/A Left FDI Nml Nml Nml Nml Nml Nml Nml Nml N/A Left EIP Nml Nml Nml Nml Nml Nml Nml Nml N/A Left Pronator teres Nml Nml Nml Nml Nml Nml Nml Nml N/A Left Biceps Nml Nml Nml Nml Nml Nml Nml Nml N/A Left Triceps Nml Nml Nml Nml Nml Nml Nml Nml N/A Left Deltoid Nml Nml Nml Nml Nml Nml Nml Nml N/A Waveforms: Motor       Sensory              ASSESSMENT & PLAN:   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) # Recurrent DVT PE - patient on anticoagulation indefinitely.  # Given history of patient's lupus/and recurrent DVT-most recent January 2025-while on Eliquis  2.5 mg twice daily-patient needs to be on full-strength anticoagulation with Eliquis  5 mg twice daily indefinitely.[ PCP  - eliquis  refills].   # Night sweats/ no weight loss/ no fevers- recommend ordering CT scan for further evaluation.    # Mild chronic intermittent thrombocytopenia- ? Sec to immune process- monitor for now. OCT 2023- [[facility]]-platelets normal- stable   # History of nephritis/lupus/ CKD stage III-cellcept ; OFF prednisone  following up with nephrology [Dr.Lateef]. stable.   #DISPOSITION: # CT CAP-  # keep  feb appt as planned- - Dr.B  All questions were  answered. The patient knows to call the clinic with any problems, questions or concerns.    Cindy JONELLE Joe, MD 01/03/2025 10:15 AM

## 2025-01-03 NOTE — Telephone Encounter (Signed)
 Called pt to sched CT - left vm - LH

## 2025-01-03 NOTE — Progress Notes (Signed)
 "  REFERRING PHYSICIAN:  No referring provider defined for this encounter.  DOS: 11/06/23, T7-12 Posterior fusion   HISTORY OF PRESENT ILLNESS:  01/03/2025 Jonathon Snow returns to see me with continued neck pain.  This been a problem for the past year or so.  Before that, he did not have as much issue with his neck.  He has tried some therapy for his neck without improvement.  He has had some injections which helped for 2 to 3 days.  His neck continues to be painful and impacts his day-to-day life.  He has some decent days and many difficult days.  07/08/2024 He has been having very severe right shoulder pain and has limited range of movement with his right shoulder.  He has an injection scheduled in his neck with Dr. Lateef in the next couple weeks.  Continues to have some shooting pains down his arm into his right hand.  04/26/2024 Jonathon Snow is s/p above surgery.    He has been dealing with significant back, neck, and arm pain.  Is worse on the left than the right.  He additionally has been dealing with some syncope.  He would like to start rehab, but is felt lightheaded at times.  He has not had a true syncopal episode in 2 months.  He sees Dr. Nelida for medical management.  He is interested in injections if possible.    PHYSICAL EXAMINATION:  Vitals:   01/03/25 0806  BP: (!) 152/110       NEUROLOGICAL:   Awake, alert, oriented to person, place, and time.   He has pain with movement of his right arm.  The pain is around his right shoulder.  He has pain near his right shoulder and biceps when he flexes his biceps.  He has 4- out of 5 strength of the right biceps and 4- out of 5 right deltoid function.  He has limited range of motion with the neck which causes obvious discomfort in all directions  Otherwise, he is 5 out of 5 throughout.  SILT   ROS (Neurologic):  Negative except as noted above   MRI C spine 02/26/2024 IMPRESSION: 1. At C6-C7, moderate bilateral  foraminal stenosis and mild canal stenosis. 2. At C5-C6, mild to moderate canal and bilateral foraminal stenosis. 3. At C4-C5, mild canal and bilateral foraminal stenosis.     Electronically Signed   By: Gilmore GORMAN Molt M.D.   On: 02/27/2024 02:42  MRI C spine 10/31/2024 MPRESSION: 1. Overall, little interval change in appearance of the cervical spine as compared to 02/26/2024. 2. Multilevel cervical spondylosis with resultant mild spinal stenosis at C4-5 and C5-6. 3. Multifactorial degenerative changes with resultant multilevel foraminal narrowing as above. Notable findings include moderate right C5 and left C6 foraminal stenosis, with mild left C7 foraminal narrowing. 4. Mild chronic compression deformities involving the superior endplates of T1 and T2 without retropulsion, stable.     Electronically Signed   By: Morene Hoard M.D.   On: 11/01/2024 18:25  EMG NCV 12/27/2024 Impression: This is a normal study. Specifically: No electrodiagnostic evidence of right or left cervical (C5-C8) motor radiculopathy. No electrodiagnostic evidence of right or left median mononeuropathy at or distal to the wrist (ie: carpal tunnel syndrome). Screening studies for right or left ulnar or radial mononeuropathies are normal.       ___________________________ Venetia Potters, MD  ASSESSMENT/PLAN:  Jonathon Snow is s/p above surgery.  He is doing reasonably well from thoracic spine intervention.  He continues to have problems of neck pain predominantly.  He does not have any evidence of instability or malalignment.  I would not recommend any surgical intervention for his neck.  I recommended that he return to his pain management doctor for ongoing medical and interventional therapy.  If they feel he is a candidate for cervical spinal cord stimulator, we will be happy to reevaluate.  I will contact his orthopedist regarding his right shoulder.    I spent a total of 15 minutes in  this patient's care today. This time was spent reviewing pertinent records including imaging studies, obtaining and confirming history, performing a directed evaluation, formulating and discussing my recommendations, and documenting the visit within the medical record.     Reeves Daisy MD Department of neurosurgery    "

## 2025-01-03 NOTE — Assessment & Plan Note (Addendum)
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  # Given history of patient's lupus/and recurrent DVT-most recent January 2025-while on Eliquis  2.5 mg twice daily-patient needs to be on full-strength anticoagulation with Eliquis  5 mg twice daily indefinitely.[ PCP  - eliquis  refills].   # Night sweats/ no weight loss/ no fevers- recommend ordering CT scan for further evaluation.    # Mild chronic intermittent thrombocytopenia- ? Sec to immune process- monitor for now. OCT 2023- [[facility]]-platelets normal- stable   # History of nephritis/lupus/ CKD stage III-cellcept ; OFF prednisone  following up with nephrology [Dr.Lateef]. stable.   #DISPOSITION: # CT CAP-  # keep  feb appt as planned- - Dr.B

## 2025-01-04 ENCOUNTER — Ambulatory Visit

## 2025-01-05 ENCOUNTER — Ambulatory Visit: Admitting: Podiatry

## 2025-01-05 DIAGNOSIS — M722 Plantar fascial fibromatosis: Secondary | ICD-10-CM

## 2025-01-05 DIAGNOSIS — S96212A Strain of intrinsic muscle and tendon at ankle and foot level, left foot, initial encounter: Secondary | ICD-10-CM

## 2025-01-05 NOTE — Progress Notes (Signed)
 "  Subjective:  Patient ID: Jonathon Snow, male    DOB: [birth year],  MRN: [id number]  Chief Complaint  Patient presents with   Foot Pain    Left heel pain     64 y.o. male presents with the above complaint.  Patient presents for follow-up of left heel pain the injection helped considerably but he started coming back the orthotics has been helping denies any other acute complaints.  He would like to discuss more aggressive treatment options.  Denies any other acute issues   Review of Systems: Negative except as noted in the HPI. Denies N/V/F/Ch.  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   Overview:  superior sagittal sinus, left transverse sinus and cortical veins    Closed left hip fracture, initial encounter (HCC) 02/18/2023   COPD (chronic obstructive pulmonary disease) (HCC)    COVID-19 virus infection 12/2020   Depression    DVT (deep venous thrombosis) (HCC)    Dyspnea    GERD (gastroesophageal reflux disease)    Head injury 11/05/2023   Heart attack (HCC) 11/05/2023   Heel spur, left 02/16/2019   Heel spur, right 02/16/2019   Hematoma of groin 11/08/2023   Herpes zoster infection of lumbar region 02/20/2020   History of DVT (deep vein thrombosis) 03/22/2020   History of kidney stones    HTN (hypertension)    Hyperlipidemia    Hypertension    Long term current use of systemic steroids 11/08/2020   Lupus    Lymphedema 10/07/2018   Morbid obesity (HCC)    Opiate abuse, episodic (HCC) 02/26/2018   Osteoporosis    Pain and swelling of right lower extremity 02/03/2023   Pneumonia    PONV (postoperative nausea and vomiting)    Postphlebitic syndrome with ulcer, 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   Presence of IVC filter 03/22/2020    Removed   Pulmonary embolism (HCC)    Renal disorder    Stage III   Severe episode of recurrent major depressive disorder, 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   Shock circulatory (HCC) 11/06/2023   Sleep apnea    STEMI (ST elevation myocardial infarction) (HCC) 11/05/2023    Current Outpatient Medications:    albuterol  (VENTOLIN  HFA) 108 (90 Base) MCG/ACT inhaler, Inhale 2 puffs into the lungs every 6 (six) hours as needed for wheezing or shortness of breath., Disp: , Rfl:    alendronate (FOSAMAX) 70 MG tablet, Take 70 mg by mouth once a week., Disp: , Rfl:    apixaban  (ELIQUIS ) 5 MG TABS tablet, Take 1 tablet (5 mg total) by mouth 2 (two) times daily., Disp: 180 tablet, Rfl: 1   ARIPiprazole  (ABILIFY ) 2 MG tablet, Take 4 mg by mouth daily., Disp: , Rfl:    azelastine (ASTELIN) 0.1 % nasal spray, 2 sprays 2 (two) times daily., Disp: , Rfl:    BENLYSTA 200 MG/ML SOSY, once a week., Disp: , Rfl:    busPIRone  (BUSPAR ) 10 MG tablet, Take 1 tablet (10 mg total) by mouth 2 (two) times daily., Disp: 180 tablet, Rfl: 0   carbidopa-levodopa (SINEMET IR) 25-100 MG tablet, Take by mouth., Disp: , Rfl:    cholecalciferol  (VITAMIN D3) 25 MCG (1000 UNIT) tablet, Take 1,000 Units by mouth daily., Disp: ,  Rfl:    clonazePAM  (KLONOPIN ) 0.5 MG tablet, Take 0.5 mg by mouth daily., Disp: , Rfl:    famotidine  (PEPCID ) 20 MG tablet, TAKE 1 TABLET(20 MG) BY MOUTH TWICE DAILY, Disp: 180 tablet, Rfl: 0   fluticasone  (FLONASE ) 50 MCG/ACT nasal spray, Place 2 sprays into both nostrils daily., Disp: 16 g, Rfl: 6   gabapentin  (NEURONTIN ) 300 MG capsule, Take 1 capsule (300 mg total) by mouth 2 (two) times daily., Disp: 60 capsule, Rfl: 2   hydrALAZINE  (APRESOLINE ) 25 MG tablet, Take 1 tablet (25 mg total) by mouth 3 (three) times daily., Disp: 270 tablet, Rfl: 1   HYDROcodone -acetaminophen  (NORCO/VICODIN) 5-325 MG tablet, Take 1 tablet by mouth every 8 (eight) hours as needed for severe pain (pain score 7-10).  Must last 30 days, Disp: 90 tablet, Rfl: 0   [START ON 02/04/2025] HYDROcodone -acetaminophen  (NORCO/VICODIN) 5-325 MG tablet, Take 1 tablet by mouth every 8 (eight) hours as needed for severe pain (pain score 7-10). Must last 30 days, Disp: 90 tablet, Rfl: 0   [START ON 03/06/2025] HYDROcodone -acetaminophen  (NORCO/VICODIN) 5-325 MG tablet, Take 1 tablet by mouth every 8 (eight) hours as needed for severe pain (pain score 7-10). Must last 30 days, Disp: 90 tablet, Rfl: 0   hydroxychloroquine (PLAQUENIL) 200 MG tablet, Take 200 mg by mouth 2 (two) times daily., Disp: , Rfl:    hydrOXYzine  (ATARAX ) 25 MG tablet, Take 25-50 mg by mouth at bedtime as needed., Disp: , Rfl:    loratadine  (CLARITIN ) 10 MG tablet, Take 10 mg by mouth daily., Disp: , Rfl:    losartan  (COZAAR ) 25 MG tablet, Take 25 mg by mouth daily., Disp: , Rfl:    meclizine  (ANTIVERT ) 12.5 MG tablet, Take 1 tablet (12.5 mg total) by mouth 3 (three) times daily as needed for dizziness., Disp: 30 tablet, Rfl: 0   metoprolol  succinate (TOPROL -XL) 25 MG 24 hr tablet, Take 1 tablet (25 mg total) by mouth daily., Disp: 90 tablet, Rfl: 3   montelukast  (SINGULAIR ) 10 MG tablet, Take 1 tablet (10 mg total) by mouth at bedtime., Disp: 90 tablet, Rfl: 1   Multiple Vitamins-Minerals (MULTIVITAMIN WITH MINERALS) tablet, Take 1 tablet by mouth daily., Disp: , Rfl:    mycophenolate  (CELLCEPT ) 500 MG tablet, Take 2 tablets (1,000 mg total) by mouth 2 (two) times daily. HOLD until neurosurgery is ok to restart this (waiting for incision to heal), Disp: , Rfl:    oxybutynin  (DITROPAN  XL) 15 MG 24 hr tablet, Take 15 mg by mouth daily., Disp: , Rfl:    pantoprazole  (PROTONIX ) 20 MG tablet, Take 1 tablet (20 mg total) by mouth 2 (two) times daily., Disp: 180 tablet, Rfl: 3   rosuvastatin  (CRESTOR ) 20 MG tablet, Take 1 tablet (20 mg total) by mouth daily., Disp: 90 tablet, Rfl: 0   sertraline  (ZOLOFT ) 100 MG tablet, Take 1.5 tablets (150 mg total) by mouth daily.,  Disp: 135 tablet, Rfl: 1   silodosin  (RAPAFLO ) 8 MG CAPS capsule, Take 8 mg by mouth daily., Disp: , Rfl:    solifenacin  (VESICARE ) 5 MG tablet, Take 5 mg by mouth daily., Disp: , Rfl:    Syringe/Needle, Disp, (SYRINGE 3CC/25GX1) 25G X 1 3 ML MISC, To be used with Vit B12 1000 mcg IM once a week for 4 weeks then once a month for 4 months. During and after loading dose with injection treatment patient should also take 1000 mcg by mouth once a day., Disp: , Rfl:    tadalafil  (CIALIS ) 20 MG  tablet, Take 1 tablet (20 mg total) by mouth daily as needed for erectile dysfunction. Avoid taking it if low blood pressure, Disp: 30 tablet, Rfl: 7   tamsulosin  (FLOMAX ) 0.4 MG CAPS capsule, Take 1 capsule (0.4 mg total) by mouth daily., Disp: 30 capsule, Rfl: 11  Social History   Tobacco Use  Smoking Status Never  Smokeless Tobacco Never    Allergies  Allergen Reactions   Enalapril Other (See Comments)    Unknown reaction   Objective:  There were no vitals filed for this visit. There is no height or weight on file to calculate BMI. Constitutional Well developed. Well nourished.  Vascular Dorsalis pedis pulses palpable bilaterally. Posterior tibial pulses palpable bilaterally. Capillary refill normal to all digits.  No cyanosis or clubbing noted. Pedal hair growth normal.  Neurologic Normal speech. Oriented to person, place, and time. Epicritic sensation to light touch grossly present bilaterally.  Dermatologic Nails well groomed and normal in appearance. No open wounds. No skin lesions.  Orthopedic: Normal joint ROM without pain or crepitus bilaterally. No visible deformities. Tender to palpation at the calcaneal tuber left. No pain with calcaneal squeeze left. Ankle ROM diminished range of motion left. Silfverskiold Test: positive left.   Radiographs: None  Assessment:   1. Plantar fasciitis of left foot   2. Strain of intrinsic muscle of left foot     Plan:  Patient was  evaluated and treated and all questions answered.  Plantar Fasciitis, left/strain of the ligament/fascia - XR reviewed as above.  - Educated on icing and stretching. Instructions given.  - Injection delivered to the plantar fascia as below. - DME: Cam boot was dispensed as well as injection was given - Pharmacologic management: None  Pes cavus/foot deformity -I explained to patient the etiology of pes cavus and relationship with heel pain/arch pain and various treatment options were discussed.  Given patient foot structure in the setting of heel pain/arch pain I believe patient will benefit from custom-made orthotics to help control the hindfoot motion support the arch of the foot and take the stress away from arches.  Patient agrees with the plan like to proceed with orthotics - Orthotics are functioning well no acute complaints   Procedure: Injection Tendon/Ligament Location: Left plantar fascia at the glabrous junction; medial approach. Skin Prep: alcohol Injectate: 0.5 cc 0.5% marcaine  plain, 0.5 cc of 1% Lidocaine , 0.5 cc kenalog  10. Disposition: Patient tolerated procedure well. Injection site dressed with a band-aid.   No follow-ups on file. "

## 2025-01-06 ENCOUNTER — Inpatient Hospital Stay: Admitting: Internal Medicine

## 2025-01-06 ENCOUNTER — Ambulatory Visit: Payer: Self-pay | Admitting: Internal Medicine

## 2025-01-06 ENCOUNTER — Inpatient Hospital Stay

## 2025-01-06 ENCOUNTER — Telehealth: Payer: Self-pay | Admitting: Internal Medicine

## 2025-01-06 ENCOUNTER — Encounter: Payer: Self-pay | Admitting: Internal Medicine

## 2025-01-06 NOTE — Telephone Encounter (Signed)
 Called pt to sched CT's - pt confirmed date/time/location - pt requested appt reminder via mychart - LH

## 2025-01-09 ENCOUNTER — Ambulatory Visit

## 2025-01-10 ENCOUNTER — Other Ambulatory Visit
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
## 2025-01-11 ENCOUNTER — Ambulatory Visit

## 2025-01-11 ENCOUNTER — Ambulatory Visit
Admission: RE | Admit: 2025-01-11 | Discharge: 2025-01-11 | Disposition: A | Source: Ambulatory Visit | Attending: Internal Medicine

## 2025-01-11 DIAGNOSIS — R61 Generalized hyperhidrosis: Secondary | ICD-10-CM

## 2025-01-13 ENCOUNTER — Telehealth: Payer: Self-pay | Admitting: Internal Medicine

## 2025-01-13 ENCOUNTER — Ambulatory Visit (INDEPENDENT_AMBULATORY_CARE_PROVIDER_SITE_OTHER)

## 2025-01-13 DIAGNOSIS — F439 Reaction to severe stress, unspecified: Secondary | ICD-10-CM | POA: Insufficient documentation

## 2025-01-13 DIAGNOSIS — F331 Major depressive disorder, recurrent, moderate: Secondary | ICD-10-CM | POA: Insufficient documentation

## 2025-01-13 DIAGNOSIS — F411 Generalized anxiety disorder: Secondary | ICD-10-CM | POA: Insufficient documentation

## 2025-01-13 NOTE — Telephone Encounter (Signed)
 fyi
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
## 2025-01-16 ENCOUNTER — Ambulatory Visit

## 2025-01-16 ENCOUNTER — Telehealth: Payer: Self-pay | Admitting: Family Medicine

## 2025-01-16 DIAGNOSIS — D699 Hemorrhagic condition, unspecified: Secondary | ICD-10-CM

## 2025-01-16 DIAGNOSIS — I82402 Acute embolism and thrombosis of unspecified deep veins of left lower extremity: Secondary | ICD-10-CM

## 2025-01-16 MED ORDER — APIXABAN 5 MG PO TABS: 5.0000 mg | ORAL_TABLET | Freq: Two times a day (BID) | ORAL | 0 refills | Status: AC

## 2025-01-16 NOTE — Telephone Encounter (Signed)
 Pt saw Mliss in 10/2024

## 2025-01-17 ENCOUNTER — Encounter: Payer: Self-pay | Admitting: Psychiatry

## 2025-01-17 ENCOUNTER — Telehealth: Admitting: Psychiatry

## 2025-01-17 DIAGNOSIS — F331 Major depressive disorder, recurrent, moderate: Secondary | ICD-10-CM | POA: Diagnosis not present

## 2025-01-17 DIAGNOSIS — F411 Generalized anxiety disorder: Secondary | ICD-10-CM | POA: Diagnosis not present

## 2025-01-17 MED ORDER — BUSPIRONE HCL 15 MG PO TABS: 15.0000 mg | ORAL_TABLET | Freq: Two times a day (BID) | ORAL | 0 refills | Status: AC

## 2025-01-17 NOTE — Patient Instructions (Addendum)
 Continue sertraline  200 mg daily Increase Buspar  15 mg twice a day  Continue Abilify  4 mg daily  Continue clonazepam  0.5 mg daily as needed for anxiety  Next appointment- 3/17 at 11 am

## 2025-01-18 ENCOUNTER — Ambulatory Visit

## 2025-01-19 ENCOUNTER — Telehealth

## 2025-01-19 NOTE — Patient Outreach (Signed)
 Complex Care Management   Visit Note  01/19/2025  Name:  Jonathon Snow MRN: [id number] DOB: [birth year]  Situation: Referral received for Complex Care Management related to Heart Failure, COPD, and Anxiety I obtained verbal consent from Patient.  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   Overview:  superior sagittal sinus, left transverse sinus and cortical veins    Closed left hip fracture, initial encounter (HCC) 02/18/2023   COPD (chronic obstructive pulmonary disease) (HCC)    COVID-19 virus infection 12/2020   Depression    DVT (deep venous thrombosis) (HCC)    Dyspnea    GERD (gastroesophageal reflux disease)    Head injury 11/05/2023   Heart attack (HCC) 11/05/2023   Heel spur, left 02/16/2019   Heel spur, right 02/16/2019   Hematoma of groin 11/08/2023   Herpes zoster infection of lumbar region 02/20/2020   History of DVT (deep vein thrombosis) 03/22/2020   History of kidney stones    HTN (hypertension)    Hyperlipidemia    Hypertension    Long term current use of systemic steroids 11/08/2020   Lupus    Lymphedema 10/07/2018   Morbid obesity (HCC)    Opiate abuse, episodic (HCC) 02/26/2018   Osteoporosis    Pain and swelling of right lower extremity 02/03/2023   Pneumonia    PONV (postoperative nausea and vomiting)    Postphlebitic syndrome with ulcer, 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   Presence of IVC filter 03/22/2020   Removed   Pulmonary embolism (HCC)    Renal disorder    Stage III   Severe episode of recurrent major depressive disorder, 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   Shock circulatory (HCC) 11/06/2023   Sleep apnea    STEMI (ST elevation  myocardial infarction) (HCC) 11/05/2023    Assessment: Patient Reported Symptoms:  Cognitive Cognitive Status: No symptoms reported, Normal speech and language skills   Health Maintenance Behaviors: None Healing Pattern: Slow Health Facilitated by: Pain control  Neurological Neurological Review of Symptoms: Headaches, Numbness Neurological Management Strategies: Coping strategies, Routine screening Neurological Self-Management Outcome: 4 (good) Neurological Comment: numbness fingers bilateral hands  HEENT HEENT Symptoms Reported: Tinnitus, Ear discharge, Ear pain HEENT Management Strategies: Adequate rest, Coping strategies, Medication therapy HEENT Self-Management Outcome: 4 (good) HEENT Comment: Being treated by ENT for above issues    Cardiovascular Cardiovascular Symptoms Reported: Chest pain or discomfort Does patient have uncontrolled Hypertension?: No Cardiovascular Management Strategies: Adequate rest, Coping strategies, Routine screening Weight: 221 lb (100.2 kg) Cardiovascular Comment: Nuclear test next week  Respiratory Respiratory Symptoms Reported: Shortness of breath Additional Respiratory Details: used rescue inhaler yesterday when having trouble catching breath Respiratory Management Strategies: Medication therapy, Adequate rest Respiratory Self-Management Outcome: 4 (good)  Endocrine Endocrine Symptoms Reported: No symptoms reported Is patient diabetic?: No Endocrine Self-Management Outcome: 4 (good)  Gastrointestinal Gastrointestinal Symptoms Reported: Reflux/heartburn Gastrointestinal Management Strategies: Coping strategies, Medication therapy Gastrointestinal Self-Management Outcome: 4 (good)    Genitourinary Genitourinary Symptoms Reported: Frequency Genitourinary Management Strategies: Adequate rest, Coping strategies Genitourinary Self-Management Outcome: 4 (good)  Integumentary Integumentary Symptoms Reported: Sweating, Bruising Additional  Integumentary Details: Being worked up now for excessive sweating; blood thinners Skin Management Strategies: Coping strategies,  Medication therapy Skin Self-Management Outcome: 3 (uncertain)  Musculoskeletal Musculoskelatal Symptoms Reviewed: Back pain, Limited mobility, Muscle pain, Unsteady gait Musculoskeletal Management Strategies: Adequate rest, Coping strategies Musculoskeletal Self-Management Outcome: 4 (good) Musculoskeletal Comment: OP PT [place]  had cancelled recently due to dizzy spells  Postponing until dizziness and cardiac workups completed Falls in the past year?: No Number of falls in past year: 1 or less Was there an injury with Fall?: No Fall Risk Category Calculator: 0 Patient Fall Risk Level: Low Fall Risk Patient at Risk for Falls Due to: History of fall(s) Fall risk Follow up: Falls evaluation completed, Education provided, Falls prevention discussed  Psychosocial Psychosocial Symptoms Reported: Anxiety - if selected complete GAD, Depression - if selected complete PHQ 2-9 Behavioral Management Strategies: Coping strategies Behavioral Health Self-Management Outcome: 4 (good) Major Change/Loss/Stressor/Fears (CP): Medical condition, self Techniques to Cope with Loss/Stress/Change: Counseling, Meditation Quality of Family Relationships: supportive Do you feel physically threatened by others?: No    01/19/2025    PHQ2-9 Depression Screening   Little interest or pleasure in doing things Several days  Feeling down, depressed, or hopeless Several days  PHQ-2 - Total Score 2  Trouble falling or staying asleep, or sleeping too much Nearly every day  Feeling tired or having little energy Nearly every day  Poor appetite or overeating  Several days  Feeling bad about yourself - or that you are a failure or have let yourself or your family down Several days  Trouble concentrating on things, such as reading the newspaper or watching television Not at all  Moving or  speaking so slowly that other people could have noticed.  Or the opposite - being so fidgety or restless that you have been moving around a lot more than usual Not at all  Thoughts that you would be better off dead, or hurting yourself in some way Not at all  PHQ2-9 Total Score 10  If you checked off any problems, how difficult have these problems made it for you to do your work, take care of things at home, or get along with other people Very difficult  Depression Interventions/Treatment Counseling, Currently on Treatment (Just started one on one counseling   Likes it)    Today's Vitals   01/19/25 1140  Weight: 221 lb (100.2 kg)   Pain Scale: 0-10 Pain Score: 7  Pain Type: Chronic pain Pain Location: Back Pain Orientation: Upper, Lower Pain Descriptors / Indicators: Burning, Constant, Stabbing Pain Onset: On-going Patients Stated Pain Goal: 0 Pain Intervention(s): Medication (See eMAR) Multiple Pain Sites: Yes  Medications Reviewed Today     Reviewed by Kay Hendricks MATSU, RN (Case Manager) on 01/19/25 at 1137  Med List Status: <None>   Medication Order Taking? Sig Documenting Provider Last Dose Status Informant  albuterol  (VENTOLIN  HFA) 108 (90 Base) MCG/ACT inhaler [id number] Yes Inhale 2 puffs into the lungs every 6 (six) hours as needed for wheezing or shortness of breath. [provider]  Active   alendronate (FOSAMAX) 70 MG tablet [id number] Yes Take 70 mg by mouth once a week. [provider]  Active   apixaban  (ELIQUIS ) 5 MG TABS tablet [id number] Yes Take 1 tablet (5 mg total) by mouth 2 (two) times daily. Sowles, Krichna, MD  Active   ARIPiprazole  (ABILIFY ) 2 MG tablet [id number] Yes Take 4 mg by mouth daily. [provider]  Active   azelastine (ASTELIN) 0.1 % nasal spray [id number] Yes 2 sprays 2 (two) times daily. [provider]  Active  BENLYSTA 200 MG/ML SOSY [id number] Yes once a week. [provider]  Active   busPIRone  (BUSPAR )  15 MG tablet [id number] Yes Take 1 tablet (15 mg total) by mouth 2 (two) times daily. Hisada, Reina, MD  Active   carbidopa-levodopa (SINEMET IR) 25-100 MG tablet [id number] Yes Take by mouth. [provider]  Active   cholecalciferol  (VITAMIN D3) 25 MCG (1000 UNIT) tablet [id number] Yes Take 1,000 Units by mouth daily. [provider]  Active Self  clonazePAM  (KLONOPIN ) 0.5 MG tablet [id number] Yes Take 0.5 mg by mouth daily. [provider]  Active Self  famotidine  (PEPCID ) 20 MG tablet [id number] Yes TAKE 1 TABLET(20 MG) BY MOUTH TWICE DAILY [name], [name], PA-C  Active   fluticasone  (FLONASE ) 50 MCG/ACT nasal spray [id number] Yes Place 2 sprays into both nostrils daily. Sowles, Krichna, MD  Active   gabapentin  (NEURONTIN ) 300 MG capsule [id number] Yes Take 1 capsule (300 mg total) by mouth 2 (two) times daily. Patel, Seema K, NP  Active   hydrALAZINE  (APRESOLINE ) 25 MG tablet [id number] Yes Take 1 tablet (25 mg total) by mouth 3 (three) times daily. [name], [name], PA-C  Active Self  HYDROcodone -acetaminophen  (NORCO/VICODIN) 5-325 MG tablet [id number] Yes Take 1 tablet by mouth every 8 (eight) hours as needed for severe pain (pain score 7-10). Must last 30 days Patel, Seema K, NP  Active   HYDROcodone -acetaminophen  (NORCO/VICODIN) 5-325 MG tablet [id number] Yes Take 1 tablet by mouth every 8 (eight) hours as needed for severe pain (pain score 7-10). Must last 30 days Patel, Seema K, NP  Active   HYDROcodone -acetaminophen  (NORCO/VICODIN) 5-325 MG tablet [id number] Yes Take 1 tablet by mouth every 8 (eight) hours as needed for severe pain (pain score 7-10). Must last 30 days Patel, Seema K, NP  Active   hydroxychloroquine (PLAQUENIL) 200 MG tablet [id number] Yes Take 200 mg by mouth 2 (two) times daily. [provider]  Active   hydrOXYzine  (ATARAX ) 25 MG tablet [id number] Yes Take 25-50 mg by mouth at bedtime as needed. [provider]  Active   loratadine  (CLARITIN ) 10 MG  tablet [id number] Yes Take 10 mg by mouth daily. [provider]  Active   losartan  (COZAAR ) 25 MG tablet [id number] Yes Take 25 mg by mouth daily. [provider]  Active   meclizine  (ANTIVERT ) 12.5 MG tablet [id number] Yes Take 1 tablet (12.5 mg total) by mouth 3 (three) times daily as needed for dizziness. Jhonny Calvin NOVAK, MD  Active   metoprolol  succinate (TOPROL -XL) 25 MG 24 hr tablet [id number] Yes Take 1 tablet (25 mg total) by mouth daily. End, Lonni, MD  Active Self  montelukast  (SINGULAIR ) 10 MG tablet [id number] Yes Take 1 tablet (10 mg total) by mouth at bedtime. [name], [name], PA-C  Active   Multiple Vitamins-Minerals (MULTIVITAMIN WITH MINERALS) tablet [id number] Yes Take 1 tablet by mouth daily. [provider]  Active Self  mycophenolate  (CELLCEPT ) 500 MG tablet [id number] Yes Take 2 tablets (1,000 mg total) by mouth 2 (two) times daily. HOLD until neurosurgery is ok to restart this (waiting for incision to heal) Marsa Edelman, DO  Active Self  oxybutynin  (DITROPAN  XL) 15 MG 24 hr tablet [id number] Yes Take 15 mg by mouth daily. [provider]  Active   pantoprazole  (PROTONIX ) 20 MG tablet [id number] Yes Take 1 tablet (20 mg total) by mouth 2 (two) times daily. Loistine Sober, NP  Active   rosuvastatin  (CRESTOR ) 20 MG tablet [id number] Yes Take 1 tablet (20 mg total) by  mouth daily. Sowles, Krichna, MD  Active   sertraline  (ZOLOFT ) 100 MG tablet [id number] Yes Take 1.5 tablets (150 mg total) by mouth daily. [name], [name], PA-C  Active Self  silodosin  (RAPAFLO ) 8 MG CAPS capsule [id number] Yes Take 8 mg by mouth daily. [provider]  Active   solifenacin  (VESICARE ) 5 MG tablet [id number] Yes Take 5 mg by mouth daily. [provider]  Active   Syringe/Needle, Disp, (SYRINGE 3CC/25GX1) 25G X 1 3 ML MISC [id number] Yes To be used with Vit B12 1000 mcg IM once a week for 4 weeks then once a month for 4 months. During and after  loading dose with injection treatment patient should also take 1000 mcg by mouth once a day. [provider]  Active   tadalafil  (CIALIS ) 20 MG tablet [id number] Yes Take 1 tablet (20 mg total) by mouth daily as needed for erectile dysfunction. Avoid taking it if low blood pressure Vaillancourt, Samantha, PA-C  Active   tamsulosin  (FLOMAX ) 0.4 MG CAPS capsule [id number] Yes Take 1 capsule (0.4 mg total) by mouth daily. Penne Knee, MD  Active   Med List Note Lonna Doyal SAUN[place], [place] 01/02/25 1100): 01/02/25 Pain contract signed DRN MR 04/05/25 07-07-24 Clearance to stop Plavix  and Eliquis  sent to Dr End via inbox. Kt 10-06-24 Message sent via EPIC to Dr. Mady requesting clearance to stop Plavix  7 days and Eliquis  3 days. Dr End responded, requesting we consult hematologist. Attempted to call Dr. Cindy Joe, was not able to get the fax number. Message left.  Send a message to Dr. Joe per EPIC.DW 10-10-24 Faxed request to stop blood thinners sent to Dr. Joe DW  07-20-24 PA request for Hydrocodone  faxed to [facility]. DW            Recommendation:   Continue Current Plan of Care  Follow Up Plan:   Telephone follow-up in 1 month  [name] RN, BSN  [place] [facility] RN Case Information Systems Manager [phone]

## 2025-01-19 NOTE — Patient Instructions (Signed)
 Visit Information  [name] was given information about Medicaid Managed Care team care coordination services as a part of their Healthy [facility] Medicaid benefit. [name]   If you would like to schedule transportation through your Healthy [facility] plan, please call the following number at least 2 days in advance of your appointment: [phone]  For information about your ride after you set it up, call Ride Assist at [phone]. Use this number to activate a Will Call pickup, or if your transportation is late for a scheduled pickup. Use this number, too, if you need to make a change or cancel a previously scheduled reservation.  If you need transportation services right away, call [phone]. The after-hours call center is staffed 24 hours to handle ride assistance and urgent reservation requests (including discharges) 365 days a year. Urgent trips include sick visits, hospital discharge requests and life-sustaining treatment.  Call the [facility] Line at [phone], at any time, 24 hours a day, 7 days a week. If you are in danger or need immediate medical attention call 911.   Please see education materials related to COPD provided by MyChart link.  Care plan and visit instructions communicated with the patient verbally today. Patient agrees to receive a copy in MyChart. Active MyChart status and patient understanding of how to access instructions and care plan via MyChart confirmed with patient.     Telephone follow up appointment with Managed Medicaid care management team member scheduled for:02/17/2025 at 11:15 AM   [name] RN, BSN  [name] VBCI-Population Health RN Case Manager   Direct [phone]   Following is a copy of your plan of care:   Goals Addressed             This Visit's Progress    VBCI RN Care Plan   On track    Problems:  Chronic Disease Management support and education needs related to CHF  Goal: Over the next  90 days the Patient will attend all scheduled medical appointments: with providers as evidenced by adherence to scheduled appointments         continue to work with RN Care Manager and/or Social Worker to address care management and care coordination needs related to CHF as evidenced by adherence to care management team scheduled appointments     take all medications exactly as prescribed and will call provider for medication related questions as evidenced by communication with provider for medication related questions     verbalize basic understanding of CHF disease process and self health management plan as evidenced by describing CHF in their own words and identifying when to seek medical attention   Interventions:   Heart Failure Interventions: Provided education on low sodium diet Assessed need for readable accurate scales in home Provided education about placing scale on hard, flat surface Advised patient to weigh each morning after emptying bladder Discussed importance of daily weight and advised patient to weigh and record daily Reviewed role of diuretics in prevention of fluid overload and management of heart failure; Discussed the importance of keeping all appointments with provider Provided patient with education about the role of exercise in the management of heart failure Screening for signs and symptoms of depression related to chronic disease state  Assessed social determinant of health barriers  UPDATE 01/20/2024 Encouraged patient to contact provider regarding concerns of potential upcoming   Nuclear Testing   Patient Self-Care Activities:  Attend all scheduled provider appointments Call pharmacy for medication refills 3-7  days in advance of running out of medications Call provider office for new concerns or questions  Notify RN Care Manager of TOC call rescheduling needs Take medications as prescribed   call office if I gain more than 2 pounds in one day or 5 pounds in one  week do ankle pumps when sitting keep legs up while sitting use salt in moderation Update: 12/11/2024 Patient verbalizes watching salt intake to less than 1500 MG daily  watch for swelling in feet, ankles and legs every day weigh myself daily begin a heart failure diary bring diary to all appointments develop a rescue plan eat more whole grains, fruits and vegetables, lean meats and healthy fats track symptoms and what helps feel better or worse  Plan:  Telephone follow up appointment with care management team member scheduled for:  02-17-2025 at 11:15 AM           VBCI RN Care Plan   On track    Problems:  Chronic Disease Management support and education needs related to COPD  Goal: Over the next 90 days the Patient will attend all scheduled medical appointments: with all providers  as evidenced by adherence to scheduled appointments         continue to work with RN Care Manager and/or Social Worker to address care management and care coordination needs related to COPD as evidenced by adherence to care management team scheduled appointments     demonstrate a decrease COPD in exacerbations as evidenced by decreased shortness of breath and improved exercise tolerance  demonstrate understanding of rationale for each prescribed medication as evidenced by verbalizing the purpose and expected benefits of the medication     take all medications exactly as prescribed and will call provider for medication related questions as evidenced by contacting provider for any medication questions or concerns     verbalize basic understanding of COPD disease process and self health management plan as evidenced by medication adherence, avoiding triggers and when to seek emergency care   Interventions:   COPD Interventions: Advised patient to engage in light exercise as tolerated 3-5 days a week to aid in the the management of COPD Patient is attending an OPPT program Advised patient to track and manage  COPD triggers Advised patient to self assesses COPD action plan zone and make appointment with provider if in the yellow zone for 48 hours without improvement Assessed social determinant of health barriers Discussed the importance of adequate rest and management of fatigue with COPD Provided education about and advised patient to utilize infection prevention strategies to reduce risk of respiratory infection Provided instruction about proper use of medications used for management of COPD including inhalers Provided patient with basic written and verbal COPD education on self care/management/and exacerbation prevention Screening for signs and symptoms of depression related to chronic disease state   UPDATE 01/19/2025 Reviewed difference between maintenance and rescue inhalers. Patient verbalizes understanding and usage of each   Patient Self-Care Activities:  Attend all scheduled provider appointments Call pharmacy for medication refills 3-7 days in advance of running out of medications Call provider office for new concerns or questions  Notify RN Care Manager of TOC call rescheduling needs Take medications as prescribed   identify and remove indoor air pollutants limit outdoor activity during cold weather do breathing exercises every day begin a symptom diary develop a rescue plan eliminate symptom triggers at home follow rescue plan if symptoms flare-up keep follow-up appointments: PCP 08-24-2024 at 10:20 am Patient attended PCP  appointment get at least 7 to 8 hours of sleep at night Patient will look into a phone app such as Insight Timer to help relax at bedtime use devices that will help like a cane, sock-puller or reacher practice relaxation or meditation daily do exercises in a comfortable position that makes breathing as easy as possible  Plan:  Telephone follow up appointment with care management team member scheduled for:  02-17-2025 11:15 AM           VBCI RN Care Plan   On  track    Problems:  Chronic Disease Management support and education needs related to Anxiety  Goal: Over the next 90 days the Patient will continue to work with Medical Illustrator and/or Social Worker to address care management and care coordination needs related to Anxiety as evidenced by adherence to care management team scheduled appointments     demonstrate a decrease Anxiety in exacerbations as evidenced by verbalization of reduced anxiety during future assessments  demonstrate understanding of rationale for each prescribed medication as evidenced by verbalizing what each medication is for and the dosage needed     not experience hospital admission as evidenced by review of electronic medical record. Hospital Admissions in last 6 months =  UPDATE: 12/21/2024 No admissions in last 6 months  take all medications exactly as prescribed and will call provider for medication related questions as evidenced by communication with provider for medication questions      Interventions:   Evaluation of current treatment plan related to Anxiety, self-management and patient's adherence to plan as established by provider. Discussed plans with patient for ongoing care management follow up and provided patient with direct contact information for care management team Evaluation of current treatment plan related to anxiety and patient's adherence to plan as established by provider Provided education to patient and/or caregiver about advanced directives Reviewed medications with patient and discussed importance of continued compliance with regimen Discussed plans with patient for ongoing care management follow up and provided patient with direct contact information for care management team Advised patient to discuss any worsening anxiety symptoms with provider Screening for signs and symptoms of depression related to chronic disease state  Assessed social determinant of health barriers  Patient Self-Care  Activities:  Attend all scheduled provider appointments Call pharmacy for medication refills 3-7 days in advance of running out of medications Call provider office for new concerns or questions  Notify RN Care Manager of TOC call rescheduling needs Take medications as prescribed   Relaxation techniques or app such as Insight Timer  UPDATE: 12/11/2024 Patient has been able to schedule appointment with One on One counseling at [facility]   UPDATE: 01/19/2025 Patient verbalizes one on one counseling has begun and states he feels more suited to one on one counseling    Plan:  Telephone follow up appointment with care management team member scheduled for:  02-17-2025 at 11:15 AM

## 2025-01-23 ENCOUNTER — Ambulatory Visit: Attending: Neurology

## 2025-01-24 ENCOUNTER — Encounter

## 2025-01-24 ENCOUNTER — Other Ambulatory Visit: Payer: Self-pay | Admitting: Family Medicine

## 2025-01-24 DIAGNOSIS — E782 Mixed hyperlipidemia: Secondary | ICD-10-CM

## 2025-01-24 DIAGNOSIS — I2510 Atherosclerotic heart disease of native coronary artery without angina pectoris: Secondary | ICD-10-CM

## 2025-01-24 NOTE — Telephone Encounter (Unsigned)
 Copied from CRM [id number]. Topic: Clinical - Medication Refill >> Jan 24, 2025  9:10 AM Travis F wrote: Medication: loratadine  (CLARITIN ) 10 MG tablet [[id number]], rosuvastatin  (CRESTOR ) 20 MG tablet [[id number]]  Has the patient contacted their pharmacy? Yes  (Agent: If yes, when and what did the pharmacy advise?) pharmacy is contacting, sent a request and didn't hear back from office.   This is the patient's preferred pharmacy:  [id number] - [place] - [address] AT [facility] [address] [place] [address] [name] [address] Phone: [phone] Fax: [phone]  Is this the correct pharmacy for this prescription? Yes If no, delete pharmacy and type the correct one.   Has the prescription been filled recently? Yes  Is the patient out of the medication? No  Has the patient been seen for an appointment in the last year OR does the patient have an upcoming appointment? Yes  Can we respond through MyChart? Yes  Agent: Please be advised that Rx refills may take up to 3 business days. We ask that you follow-up with your pharmacy.

## 2025-01-25 ENCOUNTER — Ambulatory Visit (INDEPENDENT_AMBULATORY_CARE_PROVIDER_SITE_OTHER)

## 2025-01-25 ENCOUNTER — Ambulatory Visit

## 2025-01-25 DIAGNOSIS — F331 Major depressive disorder, recurrent, moderate: Secondary | ICD-10-CM

## 2025-01-25 DIAGNOSIS — F439 Reaction to severe stress, unspecified: Secondary | ICD-10-CM

## 2025-01-25 DIAGNOSIS — F411 Generalized anxiety disorder: Secondary | ICD-10-CM

## 2025-01-25 MED ORDER — ROSUVASTATIN CALCIUM 20 MG PO TABS: 20.0000 mg | ORAL_TABLET | Freq: Every day | ORAL | 0 refills | Status: AC

## 2025-01-25 MED ORDER — LORATADINE 10 MG PO TABS: 10.0000 mg | ORAL_TABLET | Freq: Every day | ORAL | 0 refills | Status: AC

## 2025-01-25 NOTE — Telephone Encounter (Signed)
 Requested medication (s) are due for refill today - yes  Requested medication (s) are on the active medication list -yes  Future visit scheduled -no  Last refill: rosuvastatin - 10/05/24 #90- fails lab protocol-over 1 year-11/05/23                 Loratadine - 04/17/24- listed as historical medication  Notes to clinic: see above  Requested Prescriptions  Pending Prescriptions Disp Refills   rosuvastatin  (CRESTOR ) 20 MG tablet 90 tablet 0    Sig: Take 1 tablet (20 mg total) by mouth daily.     Cardiovascular:  Antilipid - Statins 2 Failed - 01/25/2025  2:23 PM      Failed - Cr in normal range and within 360 days    Creatinine  Date Value Ref Range Status  01/03/2025 1.42 (H) 0.61 - 1.24 mg/dL Final   Creat  Date Value Ref Range Status  12/12/2024 1.48 (H) 0.70 - 1.35 mg/dL Final   Creatinine, Urine  Date Value Ref Range Status  11/08/2023 153 mg/dL Final         Failed - Lipid Panel in normal range within the last 12 months    Cholesterol, Total  Date Value Ref Range Status  04/17/2016 208 (H) 100 - 199 mg/dL Final   Cholesterol  Date Value Ref Range Status  11/05/2023 199 0 - 200 mg/dL Final   LDL Cholesterol (Calc)  Date Value Ref Range Status  09/09/2022 150 (H) mg/dL (calc) Final    Comment:    Reference range: <100 . Desirable range <100 mg/dL for primary prevention;   <70 mg/dL for patients with CHD or diabetic patients  with > or = 2 CHD risk factors. SABRA LDL-C is now calculated using the Martin-Hopkins  calculation, which is a validated novel method providing  better accuracy than the Friedewald equation in the  estimation of LDL-C.  Gladis APPLETHWAITE et al. SANDREA. 7986;689(80): 2061-2068  (http://education.[facility].com/faq/FAQ164)    LDL Cholesterol  Date Value Ref Range Status  11/05/2023 119 (H) 0 - 99 mg/dL Final    Comment:           Total Cholesterol/HDL:CHD Risk Coronary Heart Disease Risk Table                     Men   Women  1/2 Average Risk    3.4   3.3  Average Risk       5.0   4.4  2 X Average Risk   9.6   7.1  3 X Average Risk  23.4   11.0        Use the calculated Patient Ratio above and the CHD Risk Table to determine the patient's CHD Risk.        ATP III CLASSIFICATION (LDL):  <100     mg/dL   Optimal  899-870  mg/dL   Near or Above                    Optimal  130-159  mg/dL   Borderline  839-810  mg/dL   High  >809     mg/dL   Very High Performed at [facility], [address] [place], [name] [address]    HDL  Date Value Ref Range Status  11/05/2023 59 >40 mg/dL Final  95/72/7982 48 >60 mg/dL Final   Triglycerides  Date Value Ref Range Status  11/05/2023 105 <150 mg/dL Final         Passed -  Patient is not pregnant      Passed - Valid encounter within last 12 months    Recent Outpatient Visits           1 month ago Urinary frequency   [place] [facility] Glenard Mire, MD   2 months ago Essential hypertension   [facility] Gareth Clarity F, FNP   4 months ago Other mucopurulent conjunctivitis of both eyes   [facility] Glenard Mire, MD   4 months ago Well adult exam   [facility] Gareth Clarity FALCON, FNP   6 months ago Neuropathy   [facility] [name], PA-C       Future Appointments             In 2 months Maurine Lukes, PA-C [facility] [place]             loratadine  (CLARITIN ) 10 MG tablet      Sig: Take 1 tablet (10 mg total) by mouth daily.     Ear, Nose, and Throat:  Antihistamines 2 Failed - 01/25/2025  2:23 PM      Failed - Cr in normal range and within 360 days    Creatinine  Date Value Ref Range Status  01/03/2025 1.42 (H) 0.61 - 1.24 mg/dL Final   Creat  Date Value Ref Range Status  12/12/2024 1.48 (H) 0.70 - 1.35 mg/dL Final   Creatinine, Urine  Date Value Ref Range Status  11/08/2023 153 mg/dL Final          Passed - Valid encounter within last 12 months    Recent Outpatient Visits           1 month ago Urinary frequency   [place] [facility] Glenard Mire, MD   2 months ago Essential hypertension   [facility] Gareth Clarity F, FNP   4 months ago Other mucopurulent conjunctivitis of both eyes   [facility] Glenard Mire, MD   4 months ago Well adult exam   [facility] Gareth Clarity FALCON, FNP   6 months ago Neuropathy   [facility] [name], PA-C       Future Appointments             In 2 months Maurine Lukes, PA-C [facility] [place]               Requested Prescriptions  Pending Prescriptions Disp Refills   rosuvastatin  (CRESTOR ) 20 MG tablet 90 tablet 0    Sig: Take 1 tablet (20 mg total) by mouth daily.     Cardiovascular:  Antilipid - Statins 2 Failed - 01/25/2025  2:23 PM      Failed - Cr in normal range and within 360 days    Creatinine  Date Value Ref Range Status  01/03/2025 1.42 (H) 0.61 - 1.24 mg/dL Final   Creat  Date Value Ref Range Status  12/12/2024 1.48 (H) 0.70 - 1.35 mg/dL Final   Creatinine, Urine  Date Value Ref Range Status  11/08/2023 153 mg/dL Final         Failed - Lipid Panel in normal range within the last 12 months    Cholesterol, Total  Date Value Ref Range Status  04/17/2016 208 (H) 100 - 199 mg/dL Final   Cholesterol  Date Value Ref Range Status  11/05/2023 199 0 -  200 mg/dL Final   LDL Cholesterol (Calc)  Date Value Ref Range Status  09/09/2022 150 (H) mg/dL (calc) Final    Comment:    Reference range: <100 . Desirable range <100 mg/dL for primary prevention;   <70 mg/dL for patients with CHD or diabetic patients  with > or = 2 CHD risk factors. SABRA LDL-C is now calculated using the Martin-Hopkins  calculation, which is a validated novel method providing  better  accuracy than the Friedewald equation in the  estimation of LDL-C.  Gladis APPLETHWAITE et al. SANDREA. 7986;689(80): 2061-2068  (http://education.[facility].com/faq/FAQ164)    LDL Cholesterol  Date Value Ref Range Status  11/05/2023 119 (H) 0 - 99 mg/dL Final    Comment:           Total Cholesterol/HDL:CHD Risk Coronary Heart Disease Risk Table                     Men   Women  1/2 Average Risk   3.4   3.3  Average Risk       5.0   4.4  2 X Average Risk   9.6   7.1  3 X Average Risk  23.4   11.0        Use the calculated Patient Ratio above and the CHD Risk Table to determine the patient's CHD Risk.        ATP III CLASSIFICATION (LDL):  <100     mg/dL   Optimal  899-870  mg/dL   Near or Above                    Optimal  130-159  mg/dL   Borderline  839-810  mg/dL   High  >809     mg/dL   Very High Performed at [facility], [address] [place], [name] [address]    HDL  Date Value Ref Range Status  11/05/2023 59 >40 mg/dL Final  95/72/7982 48 >60 mg/dL Final   Triglycerides  Date Value Ref Range Status  11/05/2023 105 <150 mg/dL Final         Passed - Patient is not pregnant      Passed - Valid encounter within last 12 months    Recent Outpatient Visits           1 month ago Urinary frequency    [facility] Glenard Mire, MD   2 months ago Essential hypertension   [facility] Gareth Clarity F, FNP   4 months ago Other mucopurulent conjunctivitis of both eyes   [facility] Glenard Mire, MD   4 months ago Well adult exam   [facility] Gareth Clarity FALCON, FNP   6 months ago Neuropathy   [facility] [name], PA-C       Future Appointments             In 2 months Maurine Lukes, PA-C  [facility] [place]             loratadine  (CLARITIN ) 10 MG tablet      Sig: Take 1 tablet (10 mg  total) by mouth daily.     Ear, Nose, and Throat:  Antihistamines 2 Failed - 01/25/2025  2:23 PM      Failed - Cr in normal range and within 360 days    Creatinine  Date Value Ref Range Status  01/03/2025 1.42 (H) 0.61 -  1.24 mg/dL Final   Creat  Date Value Ref Range Status  12/12/2024 1.48 (H) 0.70 - 1.35 mg/dL Final   Creatinine, Urine  Date Value Ref Range Status  11/08/2023 153 mg/dL Final         Passed - Valid encounter within last 12 months    Recent Outpatient Visits           1 month ago Urinary frequency   [place] [facility] Glenard Mire, MD   2 months ago Essential hypertension   [facility] Gareth Clarity F, FNP   4 months ago Other mucopurulent conjunctivitis of both eyes   [name] Glenard Mire, MD   4 months ago Well adult exam   [facility] Gareth Clarity FALCON, FNP   6 months ago Neuropathy   [facility] [name], PA-C       Future Appointments             In 2 months Maurine Lukes, [facility] [place]
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
## 2025-01-30 ENCOUNTER — Ambulatory Visit

## 2025-01-31 ENCOUNTER — Other Ambulatory Visit

## 2025-02-01 ENCOUNTER — Ambulatory Visit

## 2025-02-02 ENCOUNTER — Ambulatory Visit: Admitting: Podiatry

## 2025-02-02 ENCOUNTER — Ambulatory Visit

## 2025-02-06 ENCOUNTER — Ambulatory Visit: Admitting: Physical Therapy

## 2025-02-08 ENCOUNTER — Ambulatory Visit

## 2025-02-09 ENCOUNTER — Ambulatory Visit

## 2025-02-15 ENCOUNTER — Ambulatory Visit

## 2025-02-15 ENCOUNTER — Other Ambulatory Visit: Payer: Medicaid Other

## 2025-02-15 ENCOUNTER — Ambulatory Visit: Payer: Medicaid Other | Admitting: Internal Medicine

## 2025-02-17 ENCOUNTER — Telehealth

## 2025-03-07 ENCOUNTER — Ambulatory Visit: Admitting: Psychiatry

## 2025-03-23 ENCOUNTER — Other Ambulatory Visit

## 2025-03-27 ENCOUNTER — Encounter: Admitting: Nurse Practitioner

## 2025-03-28 ENCOUNTER — Ambulatory Visit: Admitting: Physician Assistant

## 2025-04-21 ENCOUNTER — Ambulatory Visit (INDEPENDENT_AMBULATORY_CARE_PROVIDER_SITE_OTHER): Admitting: Vascular Surgery

## 2025-09-21 ENCOUNTER — Encounter: Admitting: Family Medicine
